# Patient Record
Sex: Male | Born: 1935
Health system: Southern US, Community
[De-identification: ages and names within clinical notes are randomized; demographics above are authoritative.]

## PROBLEM LIST (undated history)

## (undated) DIAGNOSIS — I639 Cerebral infarction, unspecified: Secondary | ICD-10-CM

## (undated) DIAGNOSIS — K56609 Unspecified intestinal obstruction, unspecified as to partial versus complete obstruction: Secondary | ICD-10-CM

## (undated) DIAGNOSIS — N400 Enlarged prostate without lower urinary tract symptoms: Secondary | ICD-10-CM

## (undated) DIAGNOSIS — Z8711 Personal history of peptic ulcer disease: Secondary | ICD-10-CM

## (undated) DIAGNOSIS — D649 Anemia, unspecified: Secondary | ICD-10-CM

## (undated) DIAGNOSIS — Z87442 Personal history of urinary calculi: Secondary | ICD-10-CM

## (undated) DIAGNOSIS — G629 Polyneuropathy, unspecified: Secondary | ICD-10-CM

## (undated) DIAGNOSIS — E119 Type 2 diabetes mellitus without complications: Secondary | ICD-10-CM

## (undated) DIAGNOSIS — A809 Acute poliomyelitis, unspecified: Secondary | ICD-10-CM

## (undated) DIAGNOSIS — I10 Essential (primary) hypertension: Secondary | ICD-10-CM

## (undated) DIAGNOSIS — M1990 Unspecified osteoarthritis, unspecified site: Secondary | ICD-10-CM

## (undated) DIAGNOSIS — K5900 Constipation, unspecified: Secondary | ICD-10-CM

## (undated) DIAGNOSIS — Z8719 Personal history of other diseases of the digestive system: Secondary | ICD-10-CM

## (undated) DIAGNOSIS — R0602 Shortness of breath: Secondary | ICD-10-CM

## (undated) HISTORY — PX: INGUINAL HERNIA REPAIR: SUR1180

## (undated) HISTORY — PX: EXCISIONAL HEMORRHOIDECTOMY: SHX1541

## (undated) HISTORY — PX: EYE SURGERY: SHX253

## (undated) HISTORY — PX: COLON SURGERY: SHX602

## (undated) HISTORY — PX: BOWEL RESECTION: SHX1257

## (undated) HISTORY — DX: Cerebral infarction, unspecified: I63.9

## (undated) HISTORY — PX: COLONOSCOPY: SHX174

## (undated) HISTORY — DX: Acute poliomyelitis, unspecified: A80.9

## (undated) HISTORY — PX: CATARACT EXTRACTION W/ INTRAOCULAR LENS  IMPLANT, BILATERAL: SHX1307

---

## 1969-02-17 HISTORY — PX: INGUINAL HERNIA REPAIR: SUR1180

## 1998-10-27 ENCOUNTER — Ambulatory Visit (HOSPITAL_COMMUNITY): Admission: RE | Admit: 1998-10-27 | Discharge: 1998-10-27 | Payer: Self-pay | Admitting: General Surgery

## 1998-11-15 ENCOUNTER — Emergency Department (HOSPITAL_COMMUNITY): Admission: EM | Admit: 1998-11-15 | Discharge: 1998-11-15 | Payer: Self-pay | Admitting: Emergency Medicine

## 1999-02-18 HISTORY — PX: CATARACT EXTRACTION W/ INTRAOCULAR LENS  IMPLANT, BILATERAL: SHX1307

## 1999-02-18 HISTORY — PX: EXCISIONAL HEMORRHOIDECTOMY: SHX1541

## 1999-09-15 ENCOUNTER — Encounter: Payer: Self-pay | Admitting: Endocrinology

## 1999-09-15 ENCOUNTER — Ambulatory Visit (HOSPITAL_COMMUNITY): Admission: RE | Admit: 1999-09-15 | Discharge: 1999-09-15 | Payer: Self-pay | Admitting: Endocrinology

## 2004-08-27 ENCOUNTER — Emergency Department (HOSPITAL_COMMUNITY): Admission: EM | Admit: 2004-08-27 | Discharge: 2004-08-28 | Payer: Self-pay | Admitting: Emergency Medicine

## 2005-07-13 ENCOUNTER — Emergency Department (HOSPITAL_COMMUNITY): Admission: EM | Admit: 2005-07-13 | Discharge: 2005-07-13 | Payer: Self-pay | Admitting: Emergency Medicine

## 2005-07-18 ENCOUNTER — Encounter: Admission: RE | Admit: 2005-07-18 | Discharge: 2005-07-18 | Payer: Self-pay | Admitting: Endocrinology

## 2005-08-29 ENCOUNTER — Encounter: Admission: RE | Admit: 2005-08-29 | Discharge: 2005-09-27 | Payer: Self-pay | Admitting: Neurosurgery

## 2005-10-11 ENCOUNTER — Ambulatory Visit (HOSPITAL_COMMUNITY): Admission: RE | Admit: 2005-10-11 | Discharge: 2005-10-11 | Payer: Self-pay | Admitting: *Deleted

## 2006-10-24 ENCOUNTER — Emergency Department (HOSPITAL_COMMUNITY): Admission: EM | Admit: 2006-10-24 | Discharge: 2006-10-24 | Payer: Self-pay | Admitting: Emergency Medicine

## 2008-03-23 ENCOUNTER — Emergency Department (HOSPITAL_COMMUNITY): Admission: EM | Admit: 2008-03-23 | Discharge: 2008-03-23 | Payer: Self-pay | Admitting: Emergency Medicine

## 2008-04-10 ENCOUNTER — Encounter: Admission: RE | Admit: 2008-04-10 | Discharge: 2008-04-10 | Payer: Self-pay | Admitting: Endocrinology

## 2008-06-24 ENCOUNTER — Inpatient Hospital Stay (HOSPITAL_COMMUNITY): Admission: EM | Admit: 2008-06-24 | Discharge: 2008-07-05 | Payer: Self-pay | Admitting: Emergency Medicine

## 2008-10-02 ENCOUNTER — Encounter: Admission: RE | Admit: 2008-10-02 | Discharge: 2008-10-02 | Payer: Self-pay | Admitting: Endocrinology

## 2009-04-28 ENCOUNTER — Other Ambulatory Visit: Payer: Self-pay | Admitting: Emergency Medicine

## 2009-04-28 ENCOUNTER — Inpatient Hospital Stay (HOSPITAL_COMMUNITY): Admission: AD | Admit: 2009-04-28 | Discharge: 2009-04-30 | Payer: Self-pay | Admitting: Internal Medicine

## 2009-05-03 ENCOUNTER — Inpatient Hospital Stay (HOSPITAL_COMMUNITY): Admission: EM | Admit: 2009-05-03 | Discharge: 2009-05-05 | Payer: Self-pay | Admitting: Emergency Medicine

## 2009-09-01 ENCOUNTER — Emergency Department (HOSPITAL_COMMUNITY): Admission: EM | Admit: 2009-09-01 | Discharge: 2009-09-02 | Payer: Self-pay | Admitting: Emergency Medicine

## 2009-09-30 ENCOUNTER — Emergency Department (HOSPITAL_COMMUNITY): Admission: EM | Admit: 2009-09-30 | Discharge: 2009-09-30 | Payer: Self-pay | Admitting: Emergency Medicine

## 2010-09-11 LAB — URINALYSIS, ROUTINE W REFLEX MICROSCOPIC
Ketones, ur: NEGATIVE mg/dL
Leukocytes, UA: NEGATIVE
Nitrite: NEGATIVE
Protein, ur: NEGATIVE mg/dL
pH: 6 (ref 5.0–8.0)

## 2010-09-11 LAB — GLUCOSE, CAPILLARY: Glucose-Capillary: 429 mg/dL — ABNORMAL HIGH (ref 70–99)

## 2010-09-21 LAB — COMPREHENSIVE METABOLIC PANEL
ALT: 12 U/L (ref 0–53)
ALT: 15 U/L (ref 0–53)
AST: 19 U/L (ref 0–37)
AST: 31 U/L (ref 0–37)
BUN: 14 mg/dL (ref 6–23)
CO2: 27 mEq/L (ref 19–32)
Calcium: 8.2 mg/dL — ABNORMAL LOW (ref 8.4–10.5)
Calcium: 8 mg/dL — ABNORMAL LOW (ref 8.4–10.5)
Calcium: 8.8 mg/dL (ref 8.4–10.5)
Creatinine, Ser: 1.06 mg/dL (ref 0.4–1.5)
Creatinine, Ser: 1.25 mg/dL (ref 0.4–1.5)
GFR calc Af Amer: >60 mL/min (ref >60)
GFR calc Af Amer: >60 mL/min (ref >60)
GFR calc non Af Amer: >60 mL/min (ref >60)
Glucose, Bld: 163 mg/dL — ABNORMAL HIGH (ref 70–99)
Sodium: 140 mEq/L (ref 135–145)
Sodium: 136 mEq/L (ref 135–145)
Sodium: 140 mEq/L (ref 135–145)
Total Protein: 5 g/dL — ABNORMAL LOW (ref 6.0–8.3)
Total Protein: 4.8 g/dL — ABNORMAL LOW (ref 6.0–8.3)
Total Protein: 5.6 g/dL — ABNORMAL LOW (ref 6.0–8.3)

## 2010-09-21 LAB — CBC
HCT: 28.6 % — ABNORMAL LOW (ref 39.0–52.0)
HCT: 29.9 % — ABNORMAL LOW (ref 39.0–52.0)
HCT: 31.6 % — ABNORMAL LOW (ref 39.0–52.0)
HCT: 33.7 % — ABNORMAL LOW (ref 39.0–52.0)
HCT: 27.6 % — ABNORMAL LOW (ref 39.0–52.0)
Hemoglobin: 6.7 g/dL — CL (ref 13.0–17.0)
Hemoglobin: 10 g/dL — ABNORMAL LOW (ref 13.0–17.0)
Hemoglobin: 10.1 g/dL — ABNORMAL LOW (ref 13.0–17.0)
Hemoglobin: 10.2 g/dL — ABNORMAL LOW (ref 13.0–17.0)
MCHC: 33 g/dL (ref 30.0–36.0)
MCHC: 33.4 g/dL (ref 30.0–36.0)
MCHC: 34.1 g/dL (ref 30.0–36.0)
MCHC: 34.1 g/dL (ref 30.0–36.0)
MCHC: 34.2 g/dL (ref 30.0–36.0)
MCHC: 35.1 g/dL (ref 30.0–36.0)
MCHC: 34.4 g/dL (ref 30.0–36.0)
MCV: 93.5 fL (ref 78.0–100.0)
MCV: 94.1 fL (ref 78.0–100.0)
MCV: 93.4 fL (ref 78.0–100.0)
MCV: 93.5 fL (ref 78.0–100.0)
MCV: 93.7 fL (ref 78.0–100.0)
MCV: 94 fL (ref 78.0–100.0)
MCV: 94.7 fL (ref 78.0–100.0)
MCV: 95.5 fL (ref 78.0–100.0)
Platelets: 173 10*3/uL (ref 150–400)
Platelets: 141 10*3/uL — ABNORMAL LOW (ref 150–400)
Platelets: 147 10*3/uL — ABNORMAL LOW (ref 150–400)
Platelets: 157 10*3/uL (ref 150–400)
Platelets: 159 10*3/uL (ref 150–400)
Platelets: 168 10*3/uL (ref 150–400)
Platelets: 187 10*3/uL (ref 150–400)
RBC: 3.15 MIL/uL — ABNORMAL LOW (ref 4.22–5.81)
RBC: 3.19 MIL/uL — ABNORMAL LOW (ref 4.22–5.81)
RBC: 3.19 MIL/uL — ABNORMAL LOW (ref 4.22–5.81)
RDW: 14.5 % (ref 11.5–15.5)
RDW: 13.3 % (ref 11.5–15.5)
RDW: 13.3 % (ref 11.5–15.5)
RDW: 13.4 % (ref 11.5–15.5)
RDW: 13.5 % (ref 11.5–15.5)
RDW: 13.7 % (ref 11.5–15.5)
RDW: 14.3 % (ref 11.5–15.5)
RDW: 14.3 % (ref 11.5–15.5)
RDW: 14.5 % (ref 11.5–15.5)
WBC: 8.1 10*3/uL (ref 4.0–10.5)
WBC: 9.2 10*3/uL (ref 4.0–10.5)
WBC: 10.8 10*3/uL — ABNORMAL HIGH (ref 4.0–10.5)

## 2010-09-21 LAB — BASIC METABOLIC PANEL
BUN: 10 mg/dL (ref 6–23)
Calcium: 7.6 mg/dL — ABNORMAL LOW (ref 8.4–10.5)
Calcium: 8.8 mg/dL (ref 8.4–10.5)
GFR calc Af Amer: >60 mL/min (ref >60)
GFR calc Af Amer: >60 mL/min (ref >60)
GFR calc non Af Amer: >60 mL/min (ref >60)
GFR calc non Af Amer: >60 mL/min (ref >60)
Glucose, Bld: 128 mg/dL — ABNORMAL HIGH (ref 70–99)
Potassium: 4 mEq/L (ref 3.5–5.1)
Sodium: 136 mEq/L (ref 135–145)
Sodium: 137 mEq/L (ref 135–145)

## 2010-09-21 LAB — CROSSMATCH
Antibody Screen: NEGATIVE
Antibody Screen: NEGATIVE

## 2010-09-21 LAB — GLUCOSE, CAPILLARY
Glucose-Capillary: 132 mg/dL — ABNORMAL HIGH (ref 70–99)
Glucose-Capillary: 210 mg/dL — ABNORMAL HIGH (ref 70–99)
Glucose-Capillary: 243 mg/dL — ABNORMAL HIGH (ref 70–99)
Glucose-Capillary: 70 mg/dL (ref 70–99)
Glucose-Capillary: 88 mg/dL (ref 70–99)
Glucose-Capillary: 108 mg/dL — ABNORMAL HIGH (ref 70–99)
Glucose-Capillary: 114 mg/dL — ABNORMAL HIGH (ref 70–99)
Glucose-Capillary: 119 mg/dL — ABNORMAL HIGH (ref 70–99)
Glucose-Capillary: 141 mg/dL — ABNORMAL HIGH (ref 70–99)
Glucose-Capillary: 147 mg/dL — ABNORMAL HIGH (ref 70–99)
Glucose-Capillary: 231 mg/dL — ABNORMAL HIGH (ref 70–99)
Glucose-Capillary: 94 mg/dL (ref 70–99)
Glucose-Capillary: 97 mg/dL (ref 70–99)

## 2010-09-21 LAB — PROTIME-INR
INR: 1.3 (ref 0.00–1.49)
Prothrombin Time: 15.5 seconds — ABNORMAL HIGH (ref 11.6–15.2)
Prothrombin Time: 16.1 seconds — ABNORMAL HIGH (ref 11.6–15.2)

## 2010-09-21 LAB — STOOL CULTURE

## 2010-09-21 LAB — DIFFERENTIAL
Basophils Absolute: 0 10*3/uL (ref 0.0–0.1)
Basophils Relative: 1 % (ref 0–1)
Basophils Relative: 0 % (ref 0–1)
Eosinophils Absolute: 0 10*3/uL (ref 0.0–0.7)
Eosinophils Relative: 0 % (ref 0–5)
Eosinophils Relative: 0 % (ref 0–5)
Lymphs Abs: 0.9 10*3/uL (ref 0.7–4.0)
Lymphs Abs: 1.3 10*3/uL (ref 0.7–4.0)
Monocytes Absolute: 0.4 10*3/uL (ref 0.1–1.0)
Monocytes Relative: 3 % (ref 3–12)
Neutrophils Relative %: 90 % — ABNORMAL HIGH (ref 43–77)
Neutrophils Relative %: 84 % — ABNORMAL HIGH (ref 43–77)

## 2010-09-21 LAB — HEMOGLOBIN AND HEMATOCRIT, BLOOD
Hemoglobin: 9.2 g/dL — ABNORMAL LOW (ref 13.0–17.0)
Hemoglobin: 9 g/dL — ABNORMAL LOW (ref 13.0–17.0)

## 2010-09-21 LAB — LIPID PANEL
HDL: 36 mg/dL — ABNORMAL LOW (ref >39)
LDL Cholesterol: 25 mg/dL (ref 0–99)
Total CHOL/HDL Ratio: 2 RATIO
Triglycerides: 48 mg/dL (ref <150)
VLDL: 10 mg/dL (ref 0–40)

## 2010-09-21 LAB — POCT I-STAT, CHEM 8
BUN: 16 mg/dL (ref 6–23)
Chloride: 103 mEq/L (ref 96–112)
Glucose, Bld: 279 mg/dL — ABNORMAL HIGH (ref 70–99)
HCT: 27 % — ABNORMAL LOW (ref 39.0–52.0)
Potassium: 4.9 mEq/L (ref 3.5–5.1)

## 2010-09-21 LAB — ABO/RH: ABO/RH(D): A POS

## 2010-09-21 LAB — OVA AND PARASITE EXAMINATION: Ova and parasites: NONE SEEN

## 2010-09-21 LAB — CLOSTRIDIUM DIFFICILE EIA: C difficile Toxins A+B, EIA: NEGATIVE

## 2010-09-21 LAB — APTT: aPTT: 24 seconds (ref 24–37)

## 2010-10-03 LAB — CBC
HCT: 34.5 % — ABNORMAL LOW (ref 39.0–52.0)
HCT: 37.9 % — ABNORMAL LOW (ref 39.0–52.0)
HCT: 40.9 % (ref 39.0–52.0)
HCT: 49.2 % (ref 39.0–52.0)
Hemoglobin: 11.5 g/dL — ABNORMAL LOW (ref 13.0–17.0)
Hemoglobin: 12.5 g/dL — ABNORMAL LOW (ref 13.0–17.0)
Hemoglobin: 13.3 g/dL (ref 13.0–17.0)
Hemoglobin: 13.8 g/dL (ref 13.0–17.0)
MCHC: 33 g/dL (ref 30.0–36.0)
MCHC: 33.1 g/dL (ref 30.0–36.0)
MCHC: 33.6 g/dL (ref 30.0–36.0)
MCHC: 33.9 g/dL (ref 30.0–36.0)
MCV: 94 fL (ref 78.0–100.0)
MCV: 94 fL (ref 78.0–100.0)
MCV: 94.4 fL (ref 78.0–100.0)
MCV: 94.5 fL (ref 78.0–100.0)
MCV: 95.1 fL (ref 78.0–100.0)
MCV: 95.1 fL (ref 78.0–100.0)
Platelets: 240 10*3/uL (ref 150–400)
Platelets: 249 10*3/uL (ref 150–400)
RBC: 3.67 MIL/uL — ABNORMAL LOW (ref 4.22–5.81)
RBC: 3.99 MIL/uL — ABNORMAL LOW (ref 4.22–5.81)
RBC: 4.19 MIL/uL — ABNORMAL LOW (ref 4.22–5.81)
RBC: 4.41 MIL/uL (ref 4.22–5.81)
RBC: 5.17 MIL/uL (ref 4.22–5.81)
WBC: 19.8 10*3/uL — ABNORMAL HIGH (ref 4.0–10.5)
WBC: 6.4 10*3/uL (ref 4.0–10.5)

## 2010-10-03 LAB — GLUCOSE, CAPILLARY
Glucose-Capillary: 106 mg/dL — ABNORMAL HIGH (ref 70–99)
Glucose-Capillary: 109 mg/dL — ABNORMAL HIGH (ref 70–99)
Glucose-Capillary: 116 mg/dL — ABNORMAL HIGH (ref 70–99)
Glucose-Capillary: 117 mg/dL — ABNORMAL HIGH (ref 70–99)
Glucose-Capillary: 117 mg/dL — ABNORMAL HIGH (ref 70–99)
Glucose-Capillary: 119 mg/dL — ABNORMAL HIGH (ref 70–99)
Glucose-Capillary: 119 mg/dL — ABNORMAL HIGH (ref 70–99)
Glucose-Capillary: 122 mg/dL — ABNORMAL HIGH (ref 70–99)
Glucose-Capillary: 124 mg/dL — ABNORMAL HIGH (ref 70–99)
Glucose-Capillary: 135 mg/dL — ABNORMAL HIGH (ref 70–99)
Glucose-Capillary: 140 mg/dL — ABNORMAL HIGH (ref 70–99)
Glucose-Capillary: 143 mg/dL — ABNORMAL HIGH (ref 70–99)
Glucose-Capillary: 148 mg/dL — ABNORMAL HIGH (ref 70–99)
Glucose-Capillary: 149 mg/dL — ABNORMAL HIGH (ref 70–99)
Glucose-Capillary: 152 mg/dL — ABNORMAL HIGH (ref 70–99)
Glucose-Capillary: 161 mg/dL — ABNORMAL HIGH (ref 70–99)
Glucose-Capillary: 161 mg/dL — ABNORMAL HIGH (ref 70–99)
Glucose-Capillary: 170 mg/dL — ABNORMAL HIGH (ref 70–99)
Glucose-Capillary: 178 mg/dL — ABNORMAL HIGH (ref 70–99)
Glucose-Capillary: 179 mg/dL — ABNORMAL HIGH (ref 70–99)
Glucose-Capillary: 195 mg/dL — ABNORMAL HIGH (ref 70–99)
Glucose-Capillary: 202 mg/dL — ABNORMAL HIGH (ref 70–99)
Glucose-Capillary: 212 mg/dL — ABNORMAL HIGH (ref 70–99)
Glucose-Capillary: 212 mg/dL — ABNORMAL HIGH (ref 70–99)
Glucose-Capillary: 243 mg/dL — ABNORMAL HIGH (ref 70–99)
Glucose-Capillary: 88 mg/dL (ref 70–99)
Glucose-Capillary: 89 mg/dL (ref 70–99)
Glucose-Capillary: 91 mg/dL (ref 70–99)
Glucose-Capillary: 95 mg/dL (ref 70–99)
Glucose-Capillary: 99 mg/dL (ref 70–99)
Glucose-Capillary: 152 mg/dL — ABNORMAL HIGH (ref 70–99)
Glucose-Capillary: 189 mg/dL — ABNORMAL HIGH (ref 70–99)
Glucose-Capillary: 86 mg/dL (ref 70–99)
Glucose-Capillary: 92 mg/dL (ref 70–99)

## 2010-10-03 LAB — BASIC METABOLIC PANEL
BUN: 16 mg/dL (ref 6–23)
BUN: 7 mg/dL (ref 6–23)
CO2: 28 mEq/L (ref 19–32)
CO2: 31 mEq/L (ref 19–32)
CO2: 32 mEq/L (ref 19–32)
CO2: 34 mEq/L — ABNORMAL HIGH (ref 19–32)
Calcium: 9 mg/dL (ref 8.4–10.5)
Chloride: 101 mEq/L (ref 96–112)
Chloride: 105 mEq/L (ref 96–112)
Chloride: 98 mEq/L (ref 96–112)
Chloride: 99 mEq/L (ref 96–112)
Creatinine, Ser: 0.96 mg/dL (ref 0.4–1.5)
Creatinine, Ser: 1.11 mg/dL (ref 0.4–1.5)
Creatinine, Ser: 1.2 mg/dL (ref 0.4–1.5)
GFR calc Af Amer: >60 mL/min (ref >60)
GFR calc Af Amer: >60 mL/min (ref >60)
GFR calc Af Amer: >60 mL/min (ref >60)
GFR calc non Af Amer: >60 mL/min (ref >60)
Glucose, Bld: 240 mg/dL — ABNORMAL HIGH (ref 70–99)
Potassium: 3.3 mEq/L — ABNORMAL LOW (ref 3.5–5.1)
Potassium: 4 mEq/L (ref 3.5–5.1)
Potassium: 4 mEq/L (ref 3.5–5.1)
Sodium: 136 mEq/L (ref 135–145)
Sodium: 139 mEq/L (ref 135–145)
Sodium: 143 mEq/L (ref 135–145)

## 2010-10-03 LAB — DIFFERENTIAL
Basophils Absolute: 0.1 10*3/uL (ref 0.0–0.1)
Eosinophils Relative: 0 % (ref 0–5)
Lymphocytes Relative: 7 % — ABNORMAL LOW (ref 12–46)
Neutro Abs: 17.7 10*3/uL — ABNORMAL HIGH (ref 1.7–7.7)
Neutrophils Relative %: 89 % — ABNORMAL HIGH (ref 43–77)

## 2010-10-03 LAB — COMPREHENSIVE METABOLIC PANEL
ALT: 17 U/L (ref 0–53)
AST: 47 U/L — ABNORMAL HIGH (ref 0–37)
BUN: 11 mg/dL (ref 6–23)
BUN: 13 mg/dL (ref 6–23)
CO2: 31 mEq/L (ref 19–32)
CO2: 32 mEq/L (ref 19–32)
Calcium: 9.1 mg/dL (ref 8.4–10.5)
Chloride: 93 mEq/L — ABNORMAL LOW (ref 96–112)
Creatinine, Ser: 0.97 mg/dL (ref 0.4–1.5)
Creatinine, Ser: 1.29 mg/dL (ref 0.4–1.5)
GFR calc non Af Amer: 55 mL/min — ABNORMAL LOW (ref >60)
GFR calc non Af Amer: >60 mL/min (ref >60)
Glucose, Bld: 188 mg/dL — ABNORMAL HIGH (ref 70–99)
Glucose, Bld: 289 mg/dL — ABNORMAL HIGH (ref 70–99)
Total Bilirubin: 0.8 mg/dL (ref 0.3–1.2)

## 2010-10-03 LAB — URINALYSIS, ROUTINE W REFLEX MICROSCOPIC
Ketones, ur: 15 mg/dL — AB
Leukocytes, UA: NEGATIVE
Nitrite: NEGATIVE
Protein, ur: 100 mg/dL — AB
pH: 7 (ref 5.0–8.0)

## 2010-10-03 LAB — LIPASE, BLOOD
Lipase: 74 U/L — ABNORMAL HIGH (ref 11–59)
Lipase: <10 U/L — ABNORMAL LOW (ref 11–59)

## 2010-10-03 LAB — PROTIME-INR
INR: 1.2 (ref 0.00–1.49)
Prothrombin Time: 15.1 seconds (ref 11.6–15.2)

## 2010-10-03 LAB — URINE MICROSCOPIC-ADD ON

## 2010-11-01 NOTE — Consult Note (Signed)
NAMEKYE, HEDDEN NO.:  [id number]   MEDICAL RECORD NO.:  [id number]          PATIENT TYPE:  INP   LOCATION:  0165                         FACILITY:  [facility]   PHYSICIAN:  Lonia Blood, M.D.      DATE OF BIRTH:  03-28-1936   DATE OF CONSULTATION:  06/24/2008  DATE OF DISCHARGE:                                 CONSULTATION   PRIMARY CARE PHYSICIAN:  Dr. Darci Needle.   REASON FOR CONSULTATION:  Diabetic management.   HISTORY OF PRESENT ILLNESS:  The patient is a 75 year old gentleman with  history of diabetes, hypertension and polio who was admitted today with  presumed ileus versus small-bowel obstruction.  The patient apparently  had abdominal pain, nausea, vomiting that started today.  He was seen in  the emergency room.  He denied any diarrhea, but he did have one bowel  movement prior to coming in.  He was evaluated and had a CT scan of the  abdomen that showed possible adhesion in the midline of the pelvis  called a small bowel obstruction.  There was a small amount of pelvic  ascites and atrophy of the gluteus minimus.  He was admitted by General  Surgery for conservative measures, and we are consulted for his diabetic  management.  He denied any fever.  Denied any urinary symptoms.   PAST MEDICAL HISTORY:  1. Diabetes.  2. Recurrent headaches.  3. Hypertension.  4. Polio.   ALLERGIES:  NO KNOWN DRUG ALLERGIES.   MEDICATIONS:  1. Glucophage 1000 mg b.i.d.  2. Altace 10 mg.  3. Neurontin 400 mg b.i.d.  4. Flexeril 10 mg daily.  5. Aspirin 325 mg daily.  6. Zocor 20 mg .  7. Januvia 100 mg daily.   SOCIAL HISTORY:  The patient lives in [place].  He denied tobacco,  alcohol or IV drug use.   FAMILY HISTORY:  Noncontributory.   REVIEW OF SYSTEMS:  Mainly weakness, otherwise 12-point review of  systems per HPI.   PHYSICAL EXAMINATION:  VITAL SIGNS:  Temperature is 98, blood pressure  146/89, pulse 109, respiratory rate 24.  Satting 96% on  room air.  GENERAL:  He is awake, alert, oriented, looks acutely ill.  No acute  distress.  HEENT:  PERRL.  EOMI.  NECK:  Supple.  No JVD, no lymphadenopathy.  RESPIRATORY:  He has been good air entry bilaterally.  No wheezes, no  rales.  CARDIOVASCULAR:  He is tachycardia.  ABDOMEN:  Distended, soft with minimal bowel sounds.  Tympanitic.  EXTREMITIES:  No edema, cyanosis or clubbing.   LABORATORY DATA:  Glucose 202, white count 19.8, hemoglobin 16, platelet  count 266 with normal differentials with left shift, ANC of 17.7.  Sodium 138, potassium 4.4, chloride 93, CO2 31, glucose 289, creatinine  1.29, calcium 10.6, total protein 7.90 and albumin 4.4.  Minimal LFT  elevation.  Lipase is 74.  Urinalysis showed cloudy urine with  glucosuria, small bilirubin, mild ketones, mild proteinuria.  WBCs 0-2  and few bacteria.  Acute abdominal series showed findings consistent  with air fluid levels and borderline dilated  status small bowel loops  with prominence of gas and stool in the colon.  Appearance is abnormal  but nonspecific and could be seen in the setting of small bowel ileus or  early small-bowel obstruction.  CT abdomen and pelvis showed adhesions  in the midline of the pelvis with small bowel obstructions, small amount  of pelvic ascites, atrophy of the gluteus minimus medius.   ASSESSMENT:  Therefore, this is a 75 year old gentleman admitted with  small bowel obstruction versus ileus who is also a diabetic and  hypertensive.   RECOMMENDATIONS:  1. Patient agrees should be n.p.o.  While n.p.o.,  the patient will be      on sliding scale insulin and will hold his metformin and Januvia.      2.  Hypertension.  The patient's blood pressure seems reasonable at      this point.  If he starts rising, will use IV metoprolol p.r.n. to      control his blood pressure.  That is until he starts taking orally.      We will hold other medications including Neurontin, Altace,      Flexeril,  aspirin and Zocor until the patient's p.o. intake      resumes.  We will check hemoglobin A1c.  Will continue to follow      the patient with you.   Thank you for the consult.      Lonia Blood, M.D.  Electronically Signed     LG/MEDQ  D:  06/25/2008  T:  06/25/2008  Job:  [id number]

## 2010-11-01 NOTE — Op Note (Signed)
Mike Mack, Mike Mack NO.:  [id number]   MEDICAL RECORD NO.:  [id number]          PATIENT TYPE:  INP   LOCATION:  1530                         FACILITY:  [facility]   PHYSICIAN:  Velora Heckler, MD      DATE OF BIRTH:  [birth year]   DATE OF PROCEDURE:  06/28/2008  DATE OF DISCHARGE:                               OPERATIVE REPORT   PREOPERATIVE DIAGNOSIS:  Small bowel obstruction.   POSTOPERATIVE DIAGNOSIS:  Small bowel obstruction.   PROCEDURE:  Exploratory laparotomy with lysis of adhesions.   SURGEON:  Velora Heckler, MD, FACS   ASSISTANT:  Iona Coach, MD, FACS   ANESTHESIA:  General per Jill Side, MD   ESTIMATED BLOOD LOSS:  Minimal.   PREPARATION:  Betadine.   COMPLICATIONS:  None.   INDICATIONS:  The patient is a 75 year old black male admitted on  January 6 by Dr. Baruch Merl with signs and symptoms of small bowel  obstruction.  The patient failed to resolve on conservative measures.  Abdominal x-rays show worsening signs of small bowel obstruction.  These  were reviewed today with Dr. Rosalie Gums.  Decision is made to proceed to  laparotomy.   PROCEDURE IN DETAIL:  The procedure was done in OR #1 at the [facility].  The patient is brought to the operating room,  placed in a supine position on the operating room table.  Following  administration of general anesthesia the patient is prepped and draped  in the usual strict aseptic fashion.  After ascertaining that an  adequate level of anesthesia had been obtained a midline abdominal  incision was made with a #10 blade.  Dissection was carried down through  subcutaneous tissues.  Fascia was incised in the midline and the  peritoneal cavity was entered cautiously.  Approximately 500 mL of  ascitic fluid is evacuated from the abdomen.  Multiple dilated loops of  small bowel are delivered out of the peritoneal cavity.  Apparently a  small band like adhesion in the mid  ileum was responsible for the  obstruction.  This was easily lysed with blunt digital dissection.  Small bowel is then run from the ligament of Treitz to the ileocecal  valve without other evidence of abnormality.  Nasogastric tube is placed  and properly positioned within the stomach.  Bowel contents are moved  retrograde into the stomach and evacuated with a nasogastric tube.  Nasogastric tube was taped in position for postoperative use.  Bowels  are returned to the abdominal cavity in approximately their normal  anatomic location.  They are covered with the omentum.  Abdomen is  inspected and no other gross abnormality is identified.  Midline wound  is closed  with interrupted #1 Novofil sutures.  Subcutaneous tissues are  irrigated.  Skin was closed with stainless steel staples.  Sterile  dressings are applied.  The patient is awakened from anesthesia and  brought to the recovery room in stable condition.  The patient tolerated  the procedure well.      Velora Heckler, MD  Electronically Signed  TMG/MEDQ  D:  06/28/2008  T:  06/28/2008  Job:  [id number]

## 2010-11-01 NOTE — H&P (Signed)
NAME:  Mike Mack, Mike Mack NO.:  [id number]   MEDICAL RECORD NO.:  [id number]          PATIENT TYPE:  EMS   LOCATION:  ED                           FACILITY:  [facility]   PHYSICIAN:  Alfonse Ras, MD   DATE OF BIRTH:  Oct 23, 1935   DATE OF ADMISSION:  06/24/2008  DATE OF DISCHARGE:                              HISTORY & PHYSICAL   ADMISSION DIAGNOSIS:  Abdominal pain, ileus versus small bowel  obstruction.   HISTORY OF PRESENT ILLNESS:  The patient is a 75 year old male who  presented with complaints of stomach discomfort, nausea, and vomiting.  He had 2 normal bowel movements today and is continued to pass  significant amount of gas.  He is running a low grade temperature at  home.  He was worked up here in the emergency room and found to have a  white count of 19,000.  CT scan shows some dilated small bowel loops and  questionable transition point in the lower abdomen, however, the patient  is known to have previous abdominal surgery.  The patient is feeling  better when I examined him in the emergency room, but is still having  some nausea.   PAST MEDICAL HISTORY:  Significant for hypertension, left inguinal  hernia repair, and non-insulin-dependent diabetes mellitus.   MEDICATIONS:  Medications include:  1. Glucophage 1000 mg twice a day.  2. Altace of unknown dose.  3. Metformin of an unknown dose.  4. Neurontin 400 mg twice a day.  5. Flexeril 10 mg once a day.  6. Aspirin 325 mg a day.  7. Zocor 20 mg a day.  8. Januvia 100 mg once a day.   REVIEW OF SYSTEMS:  Significant as above for nausea.   PHYSICAL EXAMINATION:  GENERAL:  On physical examination, he is an age-  appropriate black male in no distress.  He is lying in the bed in room  20 in the emergency room at [facility].  VITAL SIGNS:  Heart rate of 109, respiratory rate of 16, temperature of  98, and blood pressure 146/89.  HEENT:  Benign.  He is tender in the posterior aspect of his  left  mandible where he had a previous tooth extraction last week.  LUNGS:  Clear to auscultation and percussion x2.  HEART:  Regular rate and rhythm with a rate of 110, but there are no  murmurs, rubs, or gallops.  ABDOMEN:  Slightly distended and tympanitic, but there are no masses and  no abdominal wall hernia defects.  EXTREMITIES:  No clubbing, cyanosis, or edema.  Normal pulses, 2+ pulses  throughout.  NEUROLOGIC:  Grossly intact.   IMPRESSION:  Questionable ileus with gastroenteritis versus small bowel  obstruction with questionable etiology with no prior abdominal surgery.   PLAN:  Admission, NG tube placement, and IV hydration.  Follow up films  in the morning, and we will follow him along.  Hopefully, he will not  need surgical intervention as he has had a colonoscopy in the last few  years per the patient's report.  He has significant amount of stool and  gas in his colon, so this is either a very early small bowel obstruction  or an ileus.  In  campus, he will be following along to help manage his  diabetes and hypertension.      Alfonse Ras, MD  Electronically Signed     KRE/MEDQ  D:  06/24/2008  T:  06/24/2008  Job:  [id number]

## 2010-11-04 NOTE — Discharge Summary (Signed)
Mike Mack, Mike Mack NO.:  [id number]   MEDICAL RECORD NO.:  [id number]          PATIENT TYPE:  INP   LOCATION:  1530                         FACILITY:  [facility]   PHYSICIAN:  Velora Heckler, MD      DATE OF BIRTH:  [birth year]   DATE OF ADMISSION:  06/24/2008  DATE OF DISCHARGE:  07/05/2008                               DISCHARGE SUMMARY   REASON FOR ADMISSION:  Abdominal pain, small bowel obstruction.   HISTORY OF PRESENT ILLNESS:  Mike Mack is a 75 year old male  admitted on the surgical service by Dr. Baruch Merl on June 24, 2008  with abdominal pain, nausea and vomiting.  The patient was evaluated in  the emergency department and found to have an elevated white blood cell  count of 19,000.  CT scan abdomen and pelvis was obtained which showed  dilated small bowel loops and a transition point in the lower abdomen.  The patient was admitted to the surgical service.  The patient was seen  in consultation by Dr. Mikeal Hawthorne from the medical service.  The patient was  prepared and taken to the operating room on June 28, 2008.  He  underwent exploratory laparotomy with lysis of adhesions for small bowel  obstruction.  Postoperative course was uncomplicated.  He had a  nasogastric tube in place for postoperative ileus.  Nasogastric tube was  removed on the 3rd postoperative day, as was his Foley catheter.  The  patient had gradual resolution of his ileus.  He started on a clear  liquid diet on the 4th postoperative day.  The patient made gradual  progress and diet was advanced.  The patient tolerated his diet and  became ambulatory.  He was prepared for discharge home on January 17.   DISCHARGE PLANNING:  The patient was discharged home July 05, 2008 in  good condition, tolerating a regular diet, and ambulating independently.  The patient will be seen back in my office at [facility]  in 2 weeks.   DISCHARGE MEDICATIONS:  Include Vicodin as  needed for pain.   FINAL DIAGNOSIS:  Small bowel obstruction secondary to adhesions.   CONDITION AT DISCHARGE:  Improved.      Velora Heckler, MD  Electronically Signed     TMG/MEDQ  D:  07/28/2008  T:  07/28/2008  Job:  [id number]

## 2010-11-04 NOTE — Op Note (Signed)
NAME:  Mike Mack, RETTER NO.:  [id number]   MEDICAL RECORD NO.:  [id number]          PATIENT TYPE:  AMB   LOCATION:  ENDO                         FACILITY:  [facility]   PHYSICIAN:  Georgiana Spinner, M.D.    DATE OF BIRTH:  Mar 29, 1936   DATE OF PROCEDURE:  10/11/2005  DATE OF DISCHARGE:                                 OPERATIVE REPORT   PROCEDURE:  Colonoscopy.   INDICATIONS:  Colon cancer screening.   ANESTHESIA:  Fentanyl 50 mcg, Versed 5 mg.   PROCEDURE:  With the patient mildly sedated in the left lateral decubitus  position, a rectal exam was performed which was unremarkable.  Subsequently,  the Olympus videoscopic colonoscope was inserted in the rectum and passed  under direct vision to the cecum identified by ileocecal valve and  appendiceal orifice, both which were photographed.  After clearing the cecum  of fecal debris, the colonoscope was slowly withdrawn taking circumferential  views of the colonic mucosa, washing and suctioning as we went in various  places until we reached the rectum which appeared normal on direct and  retroflexed view. The endoscope was straightened and withdrawn.  The  patient's vital signs and pulse oximeter remained stable.  The patient  tolerated the procedure well without apparent complications.   FINDINGS:  Unremarkable examination limited slightly by the prep in that  there were areas of some of solid stool that had to be washed and suctioned,  but no gross lesions were seen.   PLAN:  Consider repeat examination possibly in five years.           ______________________________  Georgiana Spinner, M.D.     GMO/MEDQ  D:  10/11/2005  T:  10/11/2005  Job:  [id number]

## 2011-02-02 DIAGNOSIS — L299 Pruritus, unspecified: Secondary | ICD-10-CM | POA: Insufficient documentation

## 2011-03-20 LAB — COMPREHENSIVE METABOLIC PANEL
ALT: 18
Alkaline Phosphatase: 78
BUN: 18
CO2: 27
Chloride: 103
GFR calc Af Amer: >60
Glucose, Bld: 137 — ABNORMAL HIGH
Potassium: 4.1
Sodium: 139
Total Bilirubin: 1

## 2011-03-20 LAB — URINALYSIS, ROUTINE W REFLEX MICROSCOPIC
Hgb urine dipstick: NEGATIVE
Ketones, ur: 15 — AB
Leukocytes, UA: NEGATIVE
Protein, ur: 30 — AB
Urobilinogen, UA: 1

## 2011-03-20 LAB — DIFFERENTIAL
Basophils Absolute: 0.1
Basophils Relative: 1
Eosinophils Absolute: 0
Neutro Abs: 6.2
Neutrophils Relative %: 86 — ABNORMAL HIGH

## 2011-03-20 LAB — CBC
HCT: 42.5
Hemoglobin: 13.9
Platelets: 255
RBC: 4.48
WBC: 7.2

## 2011-03-20 LAB — URINE MICROSCOPIC-ADD ON

## 2012-04-10 ENCOUNTER — Observation Stay (HOSPITAL_COMMUNITY): Payer: Medicare Other

## 2012-04-10 ENCOUNTER — Emergency Department (HOSPITAL_COMMUNITY): Payer: Medicare Other

## 2012-04-10 ENCOUNTER — Encounter (HOSPITAL_COMMUNITY): Payer: Self-pay | Admitting: Emergency Medicine

## 2012-04-10 ENCOUNTER — Inpatient Hospital Stay (HOSPITAL_COMMUNITY)
Admission: EM | Admit: 2012-04-10 | Discharge: 2012-04-12 | DRG: 065 | Disposition: A | Discharge status: Rehabilitation Facility | Payer: Medicare Other | Attending: Internal Medicine | Admitting: Internal Medicine

## 2012-04-10 DIAGNOSIS — Z8612 Personal history of poliomyelitis: Secondary | ICD-10-CM

## 2012-04-10 DIAGNOSIS — G589 Mononeuropathy, unspecified: Secondary | ICD-10-CM | POA: Diagnosis present

## 2012-04-10 DIAGNOSIS — E119 Type 2 diabetes mellitus without complications: Secondary | ICD-10-CM | POA: Diagnosis present

## 2012-04-10 DIAGNOSIS — Z79899 Other long term (current) drug therapy: Secondary | ICD-10-CM

## 2012-04-10 DIAGNOSIS — R9089 Other abnormal findings on diagnostic imaging of central nervous system: Secondary | ICD-10-CM

## 2012-04-10 DIAGNOSIS — I639 Cerebral infarction, unspecified: Secondary | ICD-10-CM | POA: Diagnosis present

## 2012-04-10 DIAGNOSIS — R930 Abnormal findings on diagnostic imaging of skull and head, not elsewhere classified: Secondary | ICD-10-CM

## 2012-04-10 DIAGNOSIS — I10 Essential (primary) hypertension: Secondary | ICD-10-CM | POA: Diagnosis present

## 2012-04-10 DIAGNOSIS — G629 Polyneuropathy, unspecified: Secondary | ICD-10-CM | POA: Diagnosis present

## 2012-04-10 DIAGNOSIS — R5381 Other malaise: Secondary | ICD-10-CM | POA: Diagnosis present

## 2012-04-10 DIAGNOSIS — I6350 Cerebral infarction due to unspecified occlusion or stenosis of unspecified cerebral artery: Principal | ICD-10-CM | POA: Diagnosis present

## 2012-04-10 DIAGNOSIS — N400 Enlarged prostate without lower urinary tract symptoms: Secondary | ICD-10-CM | POA: Diagnosis present

## 2012-04-10 DIAGNOSIS — R531 Weakness: Secondary | ICD-10-CM

## 2012-04-10 DIAGNOSIS — G910 Communicating hydrocephalus: Secondary | ICD-10-CM | POA: Diagnosis present

## 2012-04-10 DIAGNOSIS — Z8249 Family history of ischemic heart disease and other diseases of the circulatory system: Secondary | ICD-10-CM

## 2012-04-10 DIAGNOSIS — Z9049 Acquired absence of other specified parts of digestive tract: Secondary | ICD-10-CM

## 2012-04-10 HISTORY — DX: Essential (primary) hypertension: I10

## 2012-04-10 HISTORY — DX: Polyneuropathy, unspecified: G62.9

## 2012-04-10 HISTORY — DX: Benign prostatic hyperplasia without lower urinary tract symptoms: N40.0

## 2012-04-10 HISTORY — DX: Unspecified intestinal obstruction, unspecified as to partial versus complete obstruction: K56.609

## 2012-04-10 LAB — CBC
Hemoglobin: 12 g/dL — ABNORMAL LOW (ref 13.0–17.0)
MCHC: 33.1 g/dL (ref 30.0–36.0)
RDW: 13.9 % (ref 11.5–15.5)

## 2012-04-10 LAB — BASIC METABOLIC PANEL
GFR calc Af Amer: >90 mL/min (ref >90)
GFR calc non Af Amer: 79 mL/min — ABNORMAL LOW (ref >90)
Glucose, Bld: 202 mg/dL — ABNORMAL HIGH (ref 70–99)
Potassium: 4.5 mEq/L (ref 3.5–5.1)
Sodium: 134 mEq/L — ABNORMAL LOW (ref 135–145)

## 2012-04-10 LAB — GLUCOSE, CAPILLARY: Glucose-Capillary: 306 mg/dL — ABNORMAL HIGH (ref 70–99)

## 2012-04-10 LAB — HEPATIC FUNCTION PANEL
AST: 21 U/L (ref 0–37)
Bilirubin, Direct: 0.1 mg/dL (ref 0.0–0.3)
Indirect Bilirubin: 0.2 mg/dL — ABNORMAL LOW (ref 0.3–0.9)
Total Bilirubin: 0.3 mg/dL (ref 0.3–1.2)

## 2012-04-10 MED ORDER — GADOBENATE DIMEGLUMINE 529 MG/ML IV SOLN
15.0000 mL | Freq: Once | INTRAVENOUS | Status: AC | PRN
  Administered 2012-04-10: 15 mL via INTRAVENOUS

## 2012-04-10 MED ORDER — STROKE: EARLY STAGES OF RECOVERY BOOK
Freq: Once | Status: AC
  Administered 2012-04-10: 18:00:00
  Filled 2012-04-10: qty 1

## 2012-04-10 MED ORDER — ENOXAPARIN SODIUM 40 MG/0.4ML ~~LOC~~ SOLN
40.0000 mg | SUBCUTANEOUS | Status: DC
  Administered 2012-04-10 – 2012-04-11 (×2): 40 mg via SUBCUTANEOUS
  Filled 2012-04-10 (×3): qty 0.4

## 2012-04-10 MED ORDER — ONDANSETRON HCL 4 MG/2ML IJ SOLN: 4.0000 mg | Freq: Four times a day (QID) | INTRAMUSCULAR | Status: DC | PRN

## 2012-04-10 MED ORDER — INFLUENZA VIRUS VACC SPLIT PF IM SUSP
0.5000 mL | INTRAMUSCULAR | Status: AC
  Administered 2012-04-11: 0.5 mL via INTRAMUSCULAR
  Filled 2012-04-10: qty 0.5

## 2012-04-10 MED ORDER — SODIUM CHLORIDE 0.9 % IJ SOLN: 3.0000 mL | INTRAMUSCULAR | Status: DC | PRN

## 2012-04-10 MED ORDER — SODIUM CHLORIDE 0.9 % IV SOLN
250.0000 mL | INTRAVENOUS | Status: DC | PRN
Start: 2012-04-10 — End: 2012-04-12

## 2012-04-10 MED ORDER — ACETAMINOPHEN 325 MG PO TABS: 650.0000 mg | ORAL_TABLET | ORAL | Status: DC | PRN

## 2012-04-10 MED ORDER — SENNOSIDES-DOCUSATE SODIUM 8.6-50 MG PO TABS
1.0000 | ORAL_TABLET | Freq: Every evening | ORAL | Status: DC | PRN
  Filled 2012-04-10: qty 1

## 2012-04-10 MED ORDER — TAMSULOSIN HCL 0.4 MG PO CAPS
0.4000 mg | ORAL_CAPSULE | Freq: Every day | ORAL | Status: DC
  Administered 2012-04-10 – 2012-04-12 (×3): 0.4 mg via ORAL
  Filled 2012-04-10 (×3): qty 1

## 2012-04-10 MED ORDER — ACETAMINOPHEN 650 MG RE SUPP: 650.0000 mg | RECTAL | Status: DC | PRN

## 2012-04-10 MED ORDER — DARIFENACIN HYDROBROMIDE ER 15 MG PO TB24
15.0000 mg | ORAL_TABLET | Freq: Every day | ORAL | Status: DC
  Administered 2012-04-10 – 2012-04-12 (×3): 15 mg via ORAL
  Filled 2012-04-10 (×3): qty 1

## 2012-04-10 MED ORDER — INSULIN ASPART 100 UNIT/ML ~~LOC~~ SOLN
0.0000 [IU] | Freq: Three times a day (TID) | SUBCUTANEOUS | Status: DC
  Administered 2012-04-10: 3 [IU] via SUBCUTANEOUS
  Administered 2012-04-11: 5 [IU] via SUBCUTANEOUS
  Administered 2012-04-12: 3 [IU] via SUBCUTANEOUS

## 2012-04-10 MED ORDER — INSULIN DETEMIR 100 UNIT/ML ~~LOC~~ SOLN
30.0000 [IU] | Freq: Two times a day (BID) | SUBCUTANEOUS | Status: DC
  Administered 2012-04-10 – 2012-04-11 (×2): 30 [IU] via SUBCUTANEOUS
  Filled 2012-04-10: qty 10

## 2012-04-10 MED ORDER — AMLODIPINE BESYLATE 5 MG PO TABS
5.0000 mg | ORAL_TABLET | Freq: Every day | ORAL | Status: DC
  Administered 2012-04-10 – 2012-04-12 (×3): 5 mg via ORAL
  Filled 2012-04-10 (×3): qty 1

## 2012-04-10 MED ORDER — GABAPENTIN 400 MG PO CAPS
800.0000 mg | ORAL_CAPSULE | Freq: Three times a day (TID) | ORAL | Status: DC
  Administered 2012-04-10 – 2012-04-12 (×7): 800 mg via ORAL
  Filled 2012-04-10 (×8): qty 2

## 2012-04-10 MED ORDER — ASPIRIN 325 MG PO TABS
325.0000 mg | ORAL_TABLET | Freq: Every day | ORAL | Status: DC
  Administered 2012-04-10 – 2012-04-12 (×3): 325 mg via ORAL
  Filled 2012-04-10 (×3): qty 1

## 2012-04-10 MED ORDER — INSULIN ASPART 100 UNIT/ML ~~LOC~~ SOLN
0.0000 [IU] | Freq: Every day | SUBCUTANEOUS | Status: DC
  Administered 2012-04-10: 4 [IU] via SUBCUTANEOUS

## 2012-04-10 MED ORDER — SODIUM CHLORIDE 0.9 % IJ SOLN
3.0000 mL | Freq: Two times a day (BID) | INTRAMUSCULAR | Status: DC
  Administered 2012-04-10 – 2012-04-12 (×4): 3 mL via INTRAVENOUS

## 2012-04-10 NOTE — ED Notes (Signed)
Pt states he is having problems with his right leg  Pt states he is having weakness in his leg and it is difficult for him to ambulate on it  Pt uses a cane  Pt is diabetic No neuro deficits noted

## 2012-04-10 NOTE — ED Notes (Signed)
hospitalist at bedside

## 2012-04-10 NOTE — ED Notes (Signed)
Off floor for testing 

## 2012-04-10 NOTE — Care Management Note (Addendum)
    Page 1 of 1   04/12/2012     3:36:08 PM   CARE MANAGEMENT NOTE 04/12/2012  Patient:  Mike Mack, Mike Mack   Account Number:  [id number]  Date Initiated:  04/10/2012  Documentation initiated by:  Lanier Clam  Subjective/Objective Assessment:   ADMITTED W/R SIDE PARESTHESIAS.ZO:XWRUE,AVW,UJ     Action/Plan:   FROM HOME W/SPOUSE   Anticipated DC Date:  04/12/2012   Anticipated DC Plan:  IP REHAB FACILITY      DC Planning Services  CM consult      Choice offered to / List presented to:             Status of service:  Completed, signed off Medicare Important Message given?   (If response is "NO", the following Medicare IM given date fields will be blank) Date Medicare IM given:   Date Additional Medicare IM given:    Discharge Disposition:  IP REHAB FACILITY  Per UR Regulation:  Reviewed for med. necessity/level of care/duration of stay  If discussed at Long Length of Stay Meetings, dates discussed:    Comments:  04/12/12 [name] RN,BSN NCM [phone] PATIENT GOING TO [facility]RM 4030/RECEIVING MD-DR. SCHWARTZ.NURSE TO [name] [phone]. D/C SUMMARY,& D/C ORDER PUT IN.AWAITING RM/RECEIVING MD. IP REHAB APPROPRIATE.SPOKE TO PATIENT/SPOUSE-AGREEABLE.IP REHAB [facility]) WORKING ON AUTH FOR IP REHAB.LIKELY TO D/C TODAY.MD AGREE.WILL AWAIT RM/BED/RECEIVING MD.CARELINK FOR TRANSP TEL#[phone].NURSE TO CALL REPORT ONCE RM/BED/RECEING MD GIVEN TEL#[phone].  04/10/12 [name] RN,BSN NCM [phone]

## 2012-04-10 NOTE — H&P (Signed)
[facility] History and Physical  Mike Mack ZOX:[id number] DOB: 1936/01/15 DOA: 04/10/2012  Referring physician: Dr. Dana Allan PCP: Michiel Sites, MD   Chief Complaint: RLE weakness, RUE paresthesias   History of Present Illness: Mike Mack is an 76 y.o. male with a PMH of DM and HTN as well a remote history of polio as a child, who presents with worsening right lower extremity weakness over the past 2 days.  He also complains of RUE heaviness and clumsiness.  He denies any facial weakness/numbness, drooling, aphasia, dysphagia, visual changes, chest pain, palpitations, dyspnea, fainting, and headaches.   A CT of the head was done, which showed findings suggestive of normal pressure hydrocephalus.  An MRI was subsequently performed which showed an acute left thalamic stroke and findings suggestive of communicating hydrocephalus.  Review of Systems: Constitutional: No fever, no chills;  Appetite normal; No weight loss, no weight gain.  HEENT: No blurry vision, no diplopia, no pharyngitis, no dysphagia CV: No chest pain, no palpitations.  Resp: + occasional SOB, no cough. GI: No nausea, no vomiting, no diarrhea, no melena, no hematochezia.  GU: No dysuria, no hematuria.  MSK: no myalgias, no arthralgias.  Neuro:  No headache, + focal neurological deficits, no history of seizures.  Psych: No depression, no anxiety.  Endo: No thyroid disease, + DM, no heat intolerance, no cold intolerance, no polyuria, no polydipsia  Skin: No rashes, no skin lesions.  Heme: No easy bruising, no history of blood diseases.  Past Medical History Past Medical History  Diagnosis Date  . Diabetes mellitus without complication   . Hypertension   . Small bowel obstruction   . BPH (benign prostatic hyperplasia)   . Neuropathy      Past Surgical History Past Surgical History  Procedure Date  . Bowel resection   . Hernia repair   . Cataract extraction w/ intraocular lens  implant, bilateral       Social History: History   Social History  . Marital Status: Married    Spouse Name: Lyon Dumont    Number of Children: 2  . Years of Education: N/A   Occupational History  . Retired, Education administrator    Social History Main Topics  . Smoking status: Never Smoker   . Smokeless tobacco: Never Used  . Alcohol Use: No  . Drug Use: No  . Sexually Active: No   Other Topics Concern  . Not on file   Social History Narrative   Married.  Lives in [place] with wife.  Ambulates with a cane.    Family History:  Family History  Problem Relation Age of Onset  . Diabetes Sister   . Diabetes Sister   . Diabetes Brother   . Hypertension Sister   . Hypertension Sister   . Hypertension Brother   . Heart failure Mother   . Heart attack Sister     Allergies: Review of patient's allergies indicates no known allergies.  Meds: Prior to Admission medications   Medication Sig Start Date End Date Taking? Authorizing Provider  amLODipine (NORVASC) 5 MG tablet Take 5 mg by mouth daily.   Yes Historical Provider, MD  gabapentin (NEURONTIN) 400 MG capsule Take 800 mg by mouth 3 (three) times daily.   Yes Historical Provider, MD  insulin detemir (LEVEMIR) 100 UNIT/ML injection Inject 30 Units into the skin 2 (two) times daily.   Yes Historical Provider, MD  metFORMIN (GLUCOPHAGE) 1000 MG tablet Take 1,000 mg by mouth 2 (two) times daily with a  meal.   Yes Historical Provider, MD  solifenacin (VESICARE) 5 MG tablet Take 10 mg by mouth daily.   Yes Historical Provider, MD  Tamsulosin HCl (FLOMAX) 0.4 MG CAPS Take 0.4 mg by mouth.   Yes Historical Provider, MD    Physical Exam: Filed Vitals:   04/10/12 0558 04/10/12 0700 04/10/12 0853  BP: 165/91 145/78   Pulse: 83 74   Temp: 98.7 F (37.1 C)  97.8 F (36.6 C)  TempSrc: Oral    Resp: 16 16   SpO2: 100% 100%      Physical Exam: Blood pressure 145/78, pulse 74, temperature 97.8 F (36.6 C), temperature source Oral, resp. rate  16, SpO2 100.00%. BP 145/78  Pulse 74  Temp 97.8 F (36.6 C) (Oral)  Resp 16  SpO2 100%  General Appearance:    Alert, cooperative, no distress, appears stated age  Head:    Normocephalic, without obvious abnormality, atraumatic  Eyes:    PERRL, conjunctiva/corneas clear, EOM's intact    Ears:    Normal external ear canals, both ears  Nose:   Nares normal, septum midline, mucosa normal, no drainage    or sinus tenderness  Throat:   Lips, mucosa, and tongue normal; edentulous with upper dentures  Neck:   Supple, symmetrical, trachea midline, no adenopathy;       thyroid:  No enlargement/tenderness/nodules; no carotid   bruit or JVD  Back:     Symmetric, no curvature, ROM normal, no CVA tenderness  Lungs:     Clear to auscultation bilaterally, respirations unlabored  Chest wall:    No tenderness or deformity  Heart:    Regular rate and rhythm, S1 and S2 normal, no murmur, rub   or gallop  Abdomen:     Soft, non-tender, bowel sounds active all four quadrants,    no masses, no organomegaly  Extremities:   Extremities normal, atraumatic, no cyanosis or edema  Pulses:   2+ and symmetric all extremities  Skin:   Skin color, texture, turgor normal, no rashes or lesions  Lymph nodes:   Cervical, supraclavicular, and axillary nodes normal  Neurologic:   CNII-XII intact. Very subtle RUE weakness, no pronator drift, RLE weakness 2/5 strength, unable to lift RLE against gravity.    Labs on Admission:  Basic Metabolic Panel:  Lab 04/10/12 1610  NA 134*  K 4.5  CL 98  CO2 25  GLUCOSE 202*  BUN 10  CREATININE 0.99  CALCIUM 9.4  MG --  PHOS --   CBC:  Lab 04/10/12 0630  WBC 5.3  NEUTROABS --  HGB 12.0*  HCT 36.3*  MCV 84.8  PLT 306   CBG:  Lab 04/10/12 0630  GLUCAP 188*    Radiological Exams on Admission: Ct Head Wo Contrast  04/10/2012  *RADIOLOGY REPORT*  Clinical Data: Right leg weakness.  Right arm numbness.  CT HEAD WITHOUT CONTRAST  Technique:  Contiguous axial  images were obtained from the base of the skull through the vertex without contrast.  Comparison: None.  Findings: Moderate ventricular enlargement, more than expected for the level of  atrophy.  Third, fourth and lateral ventricles are all rounded and dilated suggestive of communicating hydrocephalus (normal pressure hydrocephalus).  Hypodensity in the cerebral white matter bilaterally may be due to subependymal resorption of CSF versus chronic microvascular ischemia.  No acute infarct.  No hemorrhage or mass lesion.  Chronic infarct cerebellum bilaterally. Calvarium intact.  IMPRESSION: Ventricular dilatation.  Findings are suggestive of normal pressure hydrocephalus.  Chronic cerebellar infarcts bilaterally.  No acute infarct.  Hypodensity in the cerebral white matter bilaterally may be subependymal resorption of CSF related to hydrocephalus versus chronic microvascular ischemia.   Original Report Authenticated By: Camelia Phenes, M.D.    Mr [facility] Wo Contrast  04/10/2012  *RADIOLOGY REPORT*  Clinical Data:  Weakness.  Sudden onset right-sided weakness and inability to walk beginning yesterday.  MRI HEAD WITHOUT AND WITH CONTRAST MRA HEAD WITHOUT CONTRAST  Technique:  Multiplanar, multiecho pulse sequences of the brain and surrounding structures were obtained without and with intravenous contrast.  Angiographic images of the head were obtained using MRA technique without contrast.  Contrast: 15mL MULTIHANCE GADOBENATE DIMEGLUMINE 529 MG/ML IV SOLN  Comparison:  CT head without contrast 04/10/2012.  MRI HEAD WITHOUT AND WITH CONTRAST  Findings:  The diffusion weighted images demonstrate an acute non hemorrhagic 10 mm infarct in the left thalamus.  Moderate ventricular dilation is evident.  There is prominence of the lateral and third ventricles.  The fourth ventricle is prominent as well.  The configuration is typical of communicating hydrocephalus. Periventricular T2 hyperintensities are somewhat atypical  for white matter change alone.  There may be an element of transependymal CSF flow.  Remote lacunar infarcts are noted in the cerebellum bilaterally. Extensive subcortical white matter disease is present bilaterally.  Flow is present in the major intracranial arteries.  The patient is status post bilateral lens extractions.  The globes and orbits are otherwise intact.  Mild circumferential mucosal thickening is present in the maxillary sinuses bilaterally.  The paranasal sinuses are otherwise clear. There is minimal fluid in the mastoid air cells, more prominently on the left.  The postcontrast images demonstrate no pathologic enhancement.  IMPRESSION:  1.  Acute non hemorrhagic 10 mm infarct of the left thalamus. 2.  Moderate dilation of the lateral, third, and fourth ventricles with potential transependymal CSF.  This is highly suggestive of communicating hydrocephalus. 3.  Multiple remote lacunar infarcts of the cerebellum, more prominently on the left. 4.  Advanced subcortical white matter changes bilaterally.  These likely reflect the sequelae of chronic microvascular ischemia.  MRA HEAD  Findings: The left internal carotid artery is within normal limits. A prominent left posterior communicating artery is noted.  A 2.3 mm downward outpouching is present at the right posterior communicating artery.  This likely represents an infundibulum with very small vessel seen distally.  The right A1 is dominant.  The A2 segment is azygos, a normal variant.  The left A1 and into communicating artery is small.  The MCA bifurcations are within normal limits.  The MCA branch vessels are unremarkable.  The right vertebral artery is slightly dominant to the left.  The PICA origins are visualized and normal bilaterally.  The basilar artery is within normal limits.  Both posterior cerebral arteries originate from basilar tip.  The PCA branch vessels are within normal limits.  IMPRESSION:  1.  Infundibulum of the right posterior  communicating artery. 2.  No significant proximal stenosis, aneurysm, or branch vessel occlusion.   Original Report Authenticated By: Jamesetta Orleans. MATTERN, M.D.    Mr [facility] ZO Contrast  04/10/2012  *RADIOLOGY REPORT*  Clinical Data:  Weakness.  Sudden onset right-sided weakness and inability to walk beginning yesterday.  MRI HEAD WITHOUT AND WITH CONTRAST MRA HEAD WITHOUT CONTRAST  Technique:  Multiplanar, multiecho pulse sequences of the brain and surrounding structures were obtained without and with intravenous contrast.  Angiographic images of the head were  obtained using MRA technique without contrast.  Contrast: 15mL MULTIHANCE GADOBENATE DIMEGLUMINE 529 MG/ML IV SOLN  Comparison:  CT head without contrast 04/10/2012.  MRI HEAD WITHOUT AND WITH CONTRAST  Findings:  The diffusion weighted images demonstrate an acute non hemorrhagic 10 mm infarct in the left thalamus.  Moderate ventricular dilation is evident.  There is prominence of the lateral and third ventricles.  The fourth ventricle is prominent as well.  The configuration is typical of communicating hydrocephalus. Periventricular T2 hyperintensities are somewhat atypical for white matter change alone.  There may be an element of transependymal CSF flow.  Remote lacunar infarcts are noted in the cerebellum bilaterally. Extensive subcortical white matter disease is present bilaterally.  Flow is present in the major intracranial arteries.  The patient is status post bilateral lens extractions.  The globes and orbits are otherwise intact.  Mild circumferential mucosal thickening is present in the maxillary sinuses bilaterally.  The paranasal sinuses are otherwise clear. There is minimal fluid in the mastoid air cells, more prominently on the left.  The postcontrast images demonstrate no pathologic enhancement.  IMPRESSION:  1.  Acute non hemorrhagic 10 mm infarct of the left thalamus. 2.  Moderate dilation of the lateral, third, and fourth ventricles  with potential transependymal CSF.  This is highly suggestive of communicating hydrocephalus. 3.  Multiple remote lacunar infarcts of the cerebellum, more prominently on the left. 4.  Advanced subcortical white matter changes bilaterally.  These likely reflect the sequelae of chronic microvascular ischemia.  MRA HEAD  Findings: The left internal carotid artery is within normal limits. A prominent left posterior communicating artery is noted.  A 2.3 mm downward outpouching is present at the right posterior communicating artery.  This likely represents an infundibulum with very small vessel seen distally.  The right A1 is dominant.  The A2 segment is azygos, a normal variant.  The left A1 and into communicating artery is small.  The MCA bifurcations are within normal limits.  The MCA branch vessels are unremarkable.  The right vertebral artery is slightly dominant to the left.  The PICA origins are visualized and normal bilaterally.  The basilar artery is within normal limits.  Both posterior cerebral arteries originate from basilar tip.  The PCA branch vessels are within normal limits.  IMPRESSION:  1.  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 2.  No significant proximal stenosis, aneurysm, or branch vessel occlusion.   Original Report Authenticated By: Jamesetta Orleans. MATTERN, M.D.     EKG: Independently reviewed.Sinus rhythm~ normal P axis, V-rate 50- 99; PROBABLE LEFT ATRIAL ABNORMALITY ~ P >24mS, <-0.71mV V1; CONSIDER ANTEROSEPTAL INFARCT ~ Q >14mS, V1 V2  Assessment/Plan Principal Problem:  *Acute left thalamic CVA (cerebral infarction)  Admit as observation status to a telemetry bed.  Obtain carotid Dopplers and a two-dimensional echocardiogram.  Neurology consultation has been requested. Discussed the patient's presentation with Dr. Thad Ranger.  Continue daily aspirin.  Check lipid panel and initiate therapy if indicated.  Check hemoglobin A1c to determine overall glycemic  control and adjust therapy if indicated.  Monitor blood pressure and adjust therapy for good blood pressure control if indicated. Active Problems:  Diabetes mellitus   Followup hemoglobin A1c.  Continue usual home dose of Levemir.  Hold metformin while in the hospital and use moderate scale SS I.  Hypertension  Continue Norvasc and monitor blood pressure closely.  BPH (benign prostatic hyperplasia)  Continue Flomax and Vesicare.  Neuropathy  Continue Neurontin.  Code Status: Full. Family  Communication: Mike Mack [phone] (wife) Disposition Plan: Home, when stable.  Time spent: 1 hour.  RAMA,CHRISTINA [facility] Pager [phone]  If 7PM-7AM, please contact night-coverage [email or web address] Password [facility] 04/10/2012, 12:53 PM

## 2012-04-10 NOTE — ED Notes (Signed)
MD at bedside. 

## 2012-04-10 NOTE — ED Provider Notes (Signed)
History     CSN: [id number]  Arrival date & time 04/10/12  7829   First MD Initiated Contact with Patient 04/10/12 [phone]      Chief Complaint  Patient presents with  . Extremity Weakness    (Consider location/radiation/quality/duration/timing/severity/associated sxs/prior treatment) HPI Comments: [name] presents ambulatory with his wife evaluation of right leg weakness. He reports that he has a distant history of polio as a child and has always had some weakness in his right leg were over the last 2 days he is unable to bear weight on leg. [place] feels as if his as if his leg gives out He reports it feels as if his leg just gives out.  He is unable to lift it against gravity.  He also states he is having numbness in his right hand and feels as if it is slightly heavy and clumsy.  He denies any facial weakness/numbness, drooling, aphasia, dysphagia, visual changes, chest pain, palpitations, dyspnea, fainting, and headaches.  He states he otherwise feels well and has never had any similar previous weakness.   Past Medical History  Diagnosis Date  . Diabetes mellitus without complication   . Hypertension   . Small bowel obstruction     Past Surgical History  Procedure Date  . Bowel resection   . Hernia repair     Family History  Problem Relation Age of Onset  . Diabetes Other   . Hypertension Other     History  Substance Use Topics  . Smoking status: Never Smoker   . Smokeless tobacco: Not on file  . Alcohol Use: No      Review of Systems  Constitutional: Negative for fever, chills, diaphoresis, activity change, appetite change and fatigue.  HENT: Negative for hearing loss, ear pain, congestion, sore throat, facial swelling, rhinorrhea, drooling, trouble swallowing, neck pain, neck stiffness and voice change.   Eyes: Negative for visual disturbance.  Respiratory: Negative for cough, chest tightness and shortness of breath.   Cardiovascular: Negative for chest  pain and palpitations.  Gastrointestinal: Negative for nausea, vomiting, abdominal pain and diarrhea.  Genitourinary: Negative for dysuria and difficulty urinating.  Musculoskeletal: Positive for gait problem. Negative for myalgias, joint swelling and arthralgias.  Skin: Negative for pallor and rash.  Neurological: Positive for weakness and numbness. Negative for dizziness, seizures, syncope, facial asymmetry, speech difficulty, light-headedness and headaches.  Psychiatric/Behavioral: Negative for confusion and dysphoric mood. The patient is not nervous/anxious.     Allergies  Review of patient's allergies indicates no known allergies.  Home Medications   Current Outpatient Rx  Name Route Sig Dispense Refill  . AMLODIPINE BESYLATE 5 MG PO TABS Oral Take 5 mg by mouth daily.    Marland Kitchen GABAPENTIN 400 MG PO CAPS Oral Take 800 mg by mouth 3 (three) times daily.    . INSULIN DETEMIR 100 UNIT/ML [place] SOLN Subcutaneous Inject 30 Units into the skin 2 (two) times daily.    Marland Kitchen METFORMIN HCL 1000 MG PO TABS Oral Take 1,000 mg by mouth 2 (two) times daily with a meal.    . SOLIFENACIN SUCCINATE 5 MG PO TABS Oral Take 10 mg by mouth daily.    Marland Kitchen TAMSULOSIN HCL 0.4 MG PO CAPS Oral Take 0.4 mg by mouth.      BP 145/78  Pulse 74  Temp 98.7 F (37.1 C) (Oral)  Resp 16  SpO2 100%  Physical Exam  Nursing note and vitals reviewed. Constitutional: He is oriented to person, place, and time. He  appears well-developed and well-nourished. No distress.  HENT:  Head: Normocephalic and atraumatic.  Right Ear: External ear normal.  Left Ear: External ear normal.  Nose: Nose normal.  Mouth/Throat: Oropharynx is clear and moist. No oropharyngeal exudate.  Eyes: Conjunctivae normal and EOM are normal. Pupils are equal, round, and reactive to light. Right eye exhibits no discharge. Left eye exhibits no discharge. No scleral icterus.  Neck: Normal range of motion. Neck supple. No JVD present. No tracheal deviation  present.  Cardiovascular: Normal rate, regular rhythm, normal heart sounds and intact distal pulses.  Exam reveals no gallop and no friction rub.   No murmur heard. Pulmonary/Chest: Effort normal and breath sounds normal. No stridor. No respiratory distress. He has no wheezes. He has no rales. He exhibits no tenderness.  Abdominal: Soft. Bowel sounds are normal. He exhibits no distension and no mass. There is no tenderness. There is no rebound and no guarding.  Musculoskeletal: Normal range of motion. He exhibits no edema and no tenderness.  Lymphadenopathy:    He has no cervical adenopathy.  Neurological: He is alert and oriented to person, place, and time. He is not disoriented. He displays atrophy (mild right leg atrophy). He displays no tremor. A sensory deficit (vague numbness on right hand) is present. He exhibits abnormal muscle tone (right leg, unable to lift against gravity or hold up even with assistance.  diminished dorsiflexion and plantarflexion also.). He displays no seizure activity. Coordination (multiple misses with right hand on finger to nose.  reduced speed without misses on finger to nose with left hand) abnormal. GCS eye subscore is 4. GCS verbal subscore is 5. GCS motor subscore is 6. He displays no Babinski's sign on the right side. He displays no Babinski's sign on the left side.       Mild tongue deviation to the left.  Skin: Skin is warm and dry. No rash noted. He is not diaphoretic. No erythema. No pallor.  Psychiatric: He has a normal mood and affect. His behavior is normal.    ED Course  Procedures (including critical care time)  Labs Reviewed  CBC - Abnormal; Notable for the following:    Hemoglobin 12.0 (*)     HCT 36.3 (*)     All other components within normal limits  BASIC METABOLIC PANEL - Abnormal; Notable for the following:    Sodium 134 (*)     Glucose, Bld 202 (*)     GFR calc non Af Amer 79 (*)     All other components within normal limits  GLUCOSE,  CAPILLARY - Abnormal; Notable for the following:    Glucose-Capillary 188 (*)     All other components within normal limits   Ct Head Wo Contrast  04/10/2012  *RADIOLOGY REPORT*  Clinical Data: Right leg weakness.  Right arm numbness.  CT HEAD WITHOUT CONTRAST  Technique:  Contiguous axial images were obtained from the base of the skull through the vertex without contrast.  Comparison: None.  Findings: Moderate ventricular enlargement, more than expected for the level of  atrophy.  Third, fourth and lateral ventricles are all rounded and dilated suggestive of communicating hydrocephalus (normal pressure hydrocephalus).  Hypodensity in the cerebral white matter bilaterally may be due to subependymal resorption of CSF versus chronic microvascular ischemia.  No acute infarct.  No hemorrhage or mass lesion.  Chronic infarct cerebellum bilaterally. Calvarium intact.  IMPRESSION: Ventricular dilatation.  Findings are suggestive of normal pressure hydrocephalus.  Chronic cerebellar infarcts bilaterally.  No  acute infarct.  Hypodensity in the cerebral white matter bilaterally may be subependymal resorption of CSF related to hydrocephalus versus chronic microvascular ischemia.   Original Report Authenticated By: Camelia Phenes, M.D.      No diagnosis found.    MDM  Pt presents for evaluation of right-sided weakness.  He reports he had been able to ambulate with his cane with minimal difficulty up to 2 days ago, however he is unable to stand on the leg.  He also has right hand numbness.  He is otherwise alert and oriented, NAD.  Note slight deviation of his tongue to the left.  He also has significant right leg weakness and poor coordination with his right hand on finger to nose.  He has a CT scan that demonstrates chronic cerebellar infarcts and ventricular dilatation.  His history and exam suggests he has developed a new deficit however he is out of the time window to receive thrombolytic therapy.  Will  administer po aspirin.  Will contact the hospitalist for admission.  1610.  Pt stable, NAD.  Discussed his evaluation with Dr. Darnelle Catalan (hospitalist).  Plan admit for further evaluation.        Tobin Chad, MD 04/10/12 [phone]

## 2012-04-10 NOTE — ED Notes (Signed)
Attempted to call report.  Floor RN with a Pt and will return call in 5 mins.

## 2012-04-10 NOTE — Consult Note (Signed)
Reason for Consult: Acute infarct Referring Physician: Rama  CC: Right sided weakness  HPI: Mike Mack is an 76 y.o. male with a history of polio.  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  On last afternoon ( patient can not give me an exact time) had the onset weakness in his right leg and numbness in his right arm that he noted on attempting to stand.  Reports that he is now unable to bear weight on his right leg.  Have reviewed MRI of the brain that shows an acute left thalamic infarct.  MRI was also felt by radiology to show evidence of communicating hydrocephalus.   Patient reports that doe the past few months he has not had incontinence but feels that when he has to go the bathroom he really has to go at that time and can not wait.  He has no cognitive issues.    LSN: Unknown tPA Given: No; outside time window   Past Medical History  Diagnosis Date  . Diabetes mellitus without complication   . Hypertension   . Small bowel obstruction   . BPH (benign prostatic hyperplasia)   . Neuropathy     Past Surgical History  Procedure Date  . Bowel resection   . Hernia repair   . Cataract extraction w/ intraocular lens  implant, bilateral     Family History  Problem Relation Age of Onset  . Diabetes Sister   . Diabetes Sister   . Diabetes Brother   . Hypertension Sister   . Hypertension Sister   . Hypertension Brother   . Heart failure Mother   . Heart attack Sister     Social History:  reports that he has never smoked. He has never used smokeless tobacco. He reports that he does not drink alcohol or use illicit drugs.  No Known Allergies  Medications:  I have reviewed the patient's current medications. Prior to Admission:  Prescriptions prior to admission  Medication Sig Dispense Refill  . amLODipine (NORVASC) 5 MG tablet Take 5 mg by mouth daily.      Marland Kitchen gabapentin (NEURONTIN) 400 MG capsule Take 800 mg by mouth 3 (three) times daily.      .  insulin detemir (LEVEMIR) 100 UNIT/ML injection Inject 30 Units into the skin 2 (two) times daily.      . metFORMIN (GLUCOPHAGE) 1000 MG tablet Take 1,000 mg by mouth 2 (two) times daily with a meal.      . solifenacin (VESICARE) 5 MG tablet Take 10 mg by mouth daily.      . Tamsulosin HCl (FLOMAX) 0.4 MG CAPS Take 0.4 mg by mouth.       Scheduled:   . amLODipine  5 mg Oral Daily  . aspirin  325 mg Oral Daily  . darifenacin  15 mg Oral Daily  . enoxaparin  40 mg Subcutaneous Q24H  . gabapentin  800 mg Oral TID  . influenza  inactive virus vaccine  0.5 mL Intramuscular Tomorrow-1000  . insulin aspart  0-15 Units Subcutaneous TID WC  . insulin aspart  0-5 Units Subcutaneous QHS  . insulin detemir  30 Units Subcutaneous BID  . sodium chloride  3 mL Intravenous Q12H  . Tamsulosin HCl  0.4 mg Oral Daily    ROS: History obtained from the patient  General ROS: negative for - chills, fatigue, fever, night sweats, weight gain or weight loss Psychological ROS: negative for - behavioral disorder, hallucinations, memory difficulties, mood  swings or suicidal ideation Ophthalmic ROS: negative for - blurry vision, double vision, eye pain or loss of vision ENT ROS: negative for - epistaxis, nasal discharge, oral lesions, sore throat, tinnitus or vertigo Allergy and Immunology ROS: negative for - hives or itchy/watery eyes Hematological and Lymphatic ROS: negative for - bleeding problems, bruising or swollen lymph nodes Endocrine ROS: negative for - galactorrhea, hair pattern changes, polydipsia/polyuria or temperature intolerance Respiratory ROS: negative for - cough, hemoptysis, shortness of breath or wheezing Cardiovascular ROS: negative for - chest pain, dyspnea on exertion, edema or irregular heartbeat Gastrointestinal ROS: negative for - abdominal pain, diarrhea, hematemesis, nausea/vomiting or stool incontinence Genito-Urinary ROS: as noted in HPI Musculoskeletal ROS: negative for - joint  swelling or muscular weakness Neurological ROS: as noted in HPI Dermatological ROS: negative for rash and skin lesion changes  Physical Examination: Blood pressure 156/79, pulse 77, temperature 98.4 F (36.9 C), temperature source Oral, resp. rate 16, height 5\' 5"  (1.651 m), weight 64.5 kg (142 lb 3.2 oz), SpO2 100.00%.  Neurologic Examination Mental Status: Alert, oriented, thought content appropriate.  Speech fluent without evidence of aphasia.  Able to follow 3 step commands without difficulty. Cranial Nerves: II: Discs flat bilaterally; Visual fields grossly normal, pupils equal, round, reactive to light and accommodation III,IV, VI: ptosis not present, extra-ocular motions intact bilaterally V,VII: smile symmetric, facial light touch sensation normal bilaterally VIII: hearing normal bilaterally IX,X: gag reflex present XI: bilateral shoulder shrug XII: midline tongue extension Motor: Right : Upper extremity   5/5     Left:     Upper extremity   5/5  Lower extremity   1-2/5     Lower extremity   5/5 Tone and bulk:normal tone throughout; atrophy noted of the RLE Sensory: Pinprick and light touch intact throughout, bilaterally Deep Tendon Reflexes: 1+ and in the upper extremities and absent in the lower extremities  Plantars: Right: euivocal   Left: downgoing Cerebellar: Mild dysmetria with finger to nose using the RUE.  Not able to perform heel to shin with the RLE.  Heel to shin and finger to nose intact on the left.   CV: pulses palpable throughout   Laboratory Studies:   Basic Metabolic Panel:  Lab 04/10/12 4132  NA 134*  K 4.5  CL 98  CO2 25  GLUCOSE 202*  BUN 10  CREATININE 0.99  CALCIUM 9.4  MG --  PHOS --    Liver Function Tests:  Lab 04/10/12 1249  AST 21  ALT 13  ALKPHOS 113  BILITOT 0.3  PROT 7.4  ALBUMIN 3.7   No results found for this basename: LIPASE:5,AMYLASE:5 in the last 168 hours No results found for this basename: AMMONIA:3 in the last  168 hours  CBC:  Lab 04/10/12 0630  WBC 5.3  NEUTROABS --  HGB 12.0*  HCT 36.3*  MCV 84.8  PLT 306    Cardiac Enzymes: No results found for this basename: CKTOTAL:5,CKMB:5,CKMBINDEX:5,TROPONINI:5 in the last 168 hours  BNP: No components found with this basename: POCBNP:5  CBG:  Lab 04/10/12 0630  GLUCAP 188*    Microbiology: Results for orders placed during the hospital encounter of 04/28/09  STOOL CULTURE     Status: Normal   Collection Time   04/28/09  8:55 AM      Component Value Range Status Comment   Specimen Description STOOL   Final    Special Requests FLAGYL IMMUNE:NORM   Final    Culture     Final  Value: ABUNDANT STAPHYLOCOCCUS AUREUS     NO SALMONELLA, SHIGELLA, CAMPYLOBACTER, OR YERSINIA ISOLATED   Report Status 05/03/2009 FINAL   Final   OVA AND PARASITE EXAMINATION     Status: Normal   Collection Time   04/28/09  8:55 AM      Component Value Range Status Comment   Specimen Description STOOL   Final    Special Requests NONE   Final    Ova and parasites NO OVA OR PARASITES SEEN   Final    Report Status 04/30/2009 FINAL   Final   CLOSTRIDIUM DIFFICILE EIA     Status: Normal   Collection Time   04/28/09 11:59 PM      Component Value Range Status Comment   Specimen Description STOOL   Final    Special Requests FLAGYL IMMUNE:NORM   Final    C difficile Toxins A+B, EIA NEGATIVE   Final    Report Status 04/30/2009 FINAL   Final     Coagulation Studies: No results found for this basename: LABPROT:5,INR:5 in the last 72 hours  Urinalysis: No results found for this basename: COLORURINE:2,APPERANCEUR:2,LABSPEC:2,PHURINE:2,GLUCOSEU:2,HGBUR:2,BILIRUBINUR:2,KETONESUR:2,PROTEINUR:2,UROBILINOGEN:2,NITRITE:2,LEUKOCYTESUR:2 in the last 168 hours  Lipid Panel:     Component Value Date/Time   CHOL  Value: 71        ATP III CLASSIFICATION:  <200     mg/dL   Desirable  102-725  mg/dL   Borderline High  >=366    mg/dL   High        44/08/4740 1819   TRIG 48  05/03/2009 1819   HDL 36* 05/03/2009 1819   CHOLHDL 2.0 05/03/2009 1819   VLDL 10 05/03/2009 1819   LDLCALC  Value: 25        Total Cholesterol/HDL:CHD Risk Coronary Heart Disease Risk Table                     Men   Women  1/2 Average Risk   3.4   3.3  Average Risk       5.0   4.4  2 X Average Risk   9.6   7.1  3 X Average Risk  23.4   11.0        Use the calculated Patient Ratio above and the CHD Risk Table to determine the patient's CHD Risk.        ATP III CLASSIFICATION (LDL):  <100     mg/dL   Optimal  595-638  mg/dL   Near or Above                    Optimal  130-159  mg/dL   Borderline  756-433  mg/dL   High  >295     mg/dL   Very High 18/84/1660 1819    HgbA1C:  Lab Results  Component Value Date   HGBA1C  Value: 6.3 (NOTE) The ADA recommends the following therapeutic goal for glycemic control related to Hgb A1c measurement: Goal of therapy: <6.5 Hgb A1c  Reference: American Diabetes Association: Clinical Practice Recommendations 2010, Diabetes Care, 2010, 33: (Suppl  1).* 05/03/2009    Urine Drug Screen:   No results found for this basename: labopia, cocainscrnur, labbenz, amphetmu, thcu, labbarb    Alcohol Level: No results found for this basename: ETH:2 in the last 168 hours   Imaging: Ct Head Wo Contrast  04/10/2012  *RADIOLOGY REPORT*  Clinical Data: Right leg weakness.  Right arm numbness.  CT HEAD WITHOUT CONTRAST  Technique:  Contiguous  axial images were obtained from the base of the skull through the vertex without contrast.  Comparison: None.  Findings: Moderate ventricular enlargement, more than expected for the level of  atrophy.  Third, fourth and lateral ventricles are all rounded and dilated suggestive of communicating hydrocephalus (normal pressure hydrocephalus).  Hypodensity in the cerebral white matter bilaterally may be due to subependymal resorption of CSF versus chronic microvascular ischemia.  No acute infarct.  No hemorrhage or mass lesion.  Chronic infarct  cerebellum bilaterally. Calvarium intact.  IMPRESSION: Ventricular dilatation.  Findings are suggestive of normal pressure hydrocephalus.  Chronic cerebellar infarcts bilaterally.  No acute infarct.  Hypodensity in the cerebral white matter bilaterally may be subependymal resorption of CSF related to hydrocephalus versus chronic microvascular ischemia.   Original Report Authenticated By: Camelia Phenes, M.D.    [facility] Wo Contrast  04/10/2012  *RADIOLOGY REPORT*  Clinical Data:  Weakness.  Sudden onset right-sided weakness and inability to walk beginning yesterday.  MRI HEAD WITHOUT AND WITH CONTRAST MRA HEAD WITHOUT CONTRAST  Technique:  Multiplanar, multiecho pulse sequences of the brain and surrounding structures were obtained without and with intravenous contrast.  Angiographic images of the head were obtained using MRA technique without contrast.  Contrast: 15mL MULTIHANCE GADOBENATE DIMEGLUMINE 529 MG/ML IV SOLN  Comparison:  CT head without contrast 04/10/2012.  MRI HEAD WITHOUT AND WITH CONTRAST  Findings:  The diffusion weighted images demonstrate an acute non hemorrhagic 10 mm infarct in the left thalamus.  Moderate ventricular dilation is evident.  There is prominence of the lateral and third ventricles.  The fourth ventricle is prominent as well.  The configuration is typical of communicating hydrocephalus. Periventricular T2 hyperintensities are somewhat atypical for white matter change alone.  There may be an element of transependymal CSF flow.  Remote lacunar infarcts are noted in the cerebellum bilaterally. Extensive subcortical white matter disease is present bilaterally.  Flow is present in the major intracranial arteries.  The patient is status post bilateral lens extractions.  The globes and orbits are otherwise intact.  Mild circumferential mucosal thickening is present in the maxillary sinuses bilaterally.  The paranasal sinuses are otherwise clear. There is minimal fluid in the mastoid  air cells, more prominently on the left.  The postcontrast images demonstrate no pathologic enhancement.  IMPRESSION:  1.  Acute non hemorrhagic 10 mm infarct of the left thalamus. 2.  Moderate dilation of the lateral, third, and fourth ventricles with potential transependymal CSF.  This is highly suggestive of communicating hydrocephalus. 3.  Multiple remote lacunar infarcts of the cerebellum, more prominently on the left. 4.  Advanced subcortical white matter changes bilaterally.  These likely reflect the sequelae of chronic microvascular ischemia.  MRA HEAD  Findings: The left internal carotid artery is within normal limits. A prominent left posterior communicating artery is noted.  A 2.3 mm downward outpouching is present at the right posterior communicating artery.  This likely represents an infundibulum with very small vessel seen distally.  The right A1 is dominant.  The A2 segment is azygos, a normal variant.  The left A1 and into communicating artery is small.  The MCA bifurcations are within normal limits.  The MCA branch vessels are unremarkable.  The right vertebral artery is slightly dominant to the left.  The PICA origins are visualized and normal bilaterally.  The basilar artery is within normal limits.  Both posterior cerebral arteries originate from basilar tip.  The PCA branch vessels are within normal limits.  IMPRESSION:  1.  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 2.  No significant proximal stenosis, aneurysm, or branch vessel occlusion.   Original Report Authenticated By: Jamesetta Orleans. MATTERN, M.D.    Mr [facility] WR Contrast  04/10/2012  *RADIOLOGY REPORT*  Clinical Data:  Weakness.  Sudden onset right-sided weakness and inability to walk beginning yesterday.  MRI HEAD WITHOUT AND WITH CONTRAST MRA HEAD WITHOUT CONTRAST  Technique:  Multiplanar, multiecho pulse sequences of the brain and surrounding structures were obtained without and with intravenous contrast.   Angiographic images of the head were obtained using MRA technique without contrast.  Contrast: 15mL MULTIHANCE GADOBENATE DIMEGLUMINE 529 MG/ML IV SOLN  Comparison:  CT head without contrast 04/10/2012.  MRI HEAD WITHOUT AND WITH CONTRAST  Findings:  The diffusion weighted images demonstrate an acute non hemorrhagic 10 mm infarct in the left thalamus.  Moderate ventricular dilation is evident.  There is prominence of the lateral and third ventricles.  The fourth ventricle is prominent as well.  The configuration is typical of communicating hydrocephalus. Periventricular T2 hyperintensities are somewhat atypical for white matter change alone.  There may be an element of transependymal CSF flow.  Remote lacunar infarcts are noted in the cerebellum bilaterally. Extensive subcortical white matter disease is present bilaterally.  Flow is present in the major intracranial arteries.  The patient is status post bilateral lens extractions.  The globes and orbits are otherwise intact.  Mild circumferential mucosal thickening is present in the maxillary sinuses bilaterally.  The paranasal sinuses are otherwise clear. There is minimal fluid in the mastoid air cells, more prominently on the left.  The postcontrast images demonstrate no pathologic enhancement.  IMPRESSION:  1.  Acute non hemorrhagic 10 mm infarct of the left thalamus. 2.  Moderate dilation of the lateral, third, and fourth ventricles with potential transependymal CSF.  This is highly suggestive of communicating hydrocephalus. 3.  Multiple remote lacunar infarcts of the cerebellum, more prominently on the left. 4.  Advanced subcortical white matter changes bilaterally.  These likely reflect the sequelae of chronic microvascular ischemia.  MRA HEAD  Findings: The left internal carotid artery is within normal limits. A prominent left posterior communicating artery is noted.  A 2.3 mm downward outpouching is present at the right posterior communicating artery.  This  likely represents an infundibulum with very small vessel seen distally.  The right A1 is dominant.  The A2 segment is azygos, a normal variant.  The left A1 and into communicating artery is small.  The MCA bifurcations are within normal limits.  The MCA branch vessels are unremarkable.  The right vertebral artery is slightly dominant to the left.  The PICA origins are visualized and normal bilaterally.  The basilar artery is within normal limits.  Both posterior cerebral arteries originate from basilar tip.  The PCA branch vessels are within normal limits.  IMPRESSION:  1.  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 2.  No significant proximal stenosis, aneurysm, or branch vessel occlusion.   Original Report Authenticated By: Jamesetta Orleans. MATTERN, M.D.      Assessment/Plan:  Patient Active Hospital Problem List: Acute left thalamic CVA (cerebral infarction) (04/10/2012)   Assessment: 76 year old male with multiple risk factors presenting with right sided weakness and a right thalamic infarct.  Has a history of polio and premorbid right sided weakness.  Imaging also suggests a NPH.  Patient has no symptoms of such and do not feel that further work up recommended for  this disorder at this time.    Stroke Risk Factors:  HTN, DM    Plan:  1. HgbA1c, fasting lipid panel 2. PT consult, OT consult 3. Echocardiogram 4. Carotid dopplers 5. Prophylactic therapy-Antiplatelet med: Aspirin - dose 325mg  daily 6. Risk factor modification 7. Telemetry monitoring 8. Frequent neuro checks   Thana Farr, MD [facility] [phone] 04/10/2012, 4:19 PM

## 2012-04-10 NOTE — ED Notes (Signed)
When putting pt on the stretcher from wheelchair pt has marked weakness noted to his right leg and very unsteady when standing   Acuity increased to a level 2

## 2012-04-11 DIAGNOSIS — I10 Essential (primary) hypertension: Secondary | ICD-10-CM

## 2012-04-11 DIAGNOSIS — R5383 Other fatigue: Secondary | ICD-10-CM

## 2012-04-11 DIAGNOSIS — E119 Type 2 diabetes mellitus without complications: Secondary | ICD-10-CM

## 2012-04-11 DIAGNOSIS — I6350 Cerebral infarction due to unspecified occlusion or stenosis of unspecified cerebral artery: Secondary | ICD-10-CM

## 2012-04-11 LAB — GLUCOSE, CAPILLARY: Glucose-Capillary: 89 mg/dL (ref 70–99)

## 2012-04-11 LAB — LIPID PANEL
Cholesterol: 157 mg/dL (ref 0–200)
Triglycerides: 89 mg/dL (ref <150)
VLDL: 18 mg/dL (ref 0–40)

## 2012-04-11 LAB — HEMOGLOBIN A1C
Hgb A1c MFr Bld: 7.9 % — ABNORMAL HIGH (ref <5.7)
Mean Plasma Glucose: 180 mg/dL — ABNORMAL HIGH (ref <117)

## 2012-04-11 MED ORDER — ATORVASTATIN CALCIUM 20 MG PO TABS
20.0000 mg | ORAL_TABLET | Freq: Every day | ORAL | Status: DC
  Administered 2012-04-11: 20 mg via ORAL
  Filled 2012-04-11 (×2): qty 1

## 2012-04-11 MED ORDER — INSULIN DETEMIR 100 UNIT/ML ~~LOC~~ SOLN
32.0000 [IU] | Freq: Two times a day (BID) | SUBCUTANEOUS | Status: DC
  Administered 2012-04-11 – 2012-04-12 (×2): 32 [IU] via SUBCUTANEOUS
  Filled 2012-04-11: qty 10

## 2012-04-11 NOTE — Progress Notes (Signed)
Rehab Admissions Coordinator Note:  Patient was screened by Clois Dupes for appropriateness for an Inpatient Acute Rehab Consult. Noted therapy recommendations and inpt rehab consult already placed. Roderic Palau will follow up with pt after rehab physician completes his assessment. [phone].   Clois Dupes, RN 04/11/2012, 3:17 PM  I can be reached at [phone].

## 2012-04-11 NOTE — Progress Notes (Signed)
  Echocardiogram 2D Echocardiogram has been performed.  Mike Mack 04/11/2012, 9:31 AM

## 2012-04-11 NOTE — Progress Notes (Signed)
Subjective: No new focal complaints.  Patient working with therapy.  Objective: Current vital signs: BP 152/82  Pulse 89  Temp 98.1 F (36.7 C) (Oral)  Resp 18  Ht 5\' 5"  (1.651 m)  Wt 64.5 kg (142 lb 3.2 oz)  BMI 23.66 kg/m2  SpO2 94% Vital signs in last 24 hours: Temp:  [97.5 F (36.4 C)-98.2 F (36.8 C)] 98.1 F (36.7 C) (10/24 1832) Pulse Rate:  [62-89] 89  (10/24 1832) Resp:  [16-18] 18  (10/24 1832) BP: (85-158)/(47-82) 152/82 mmHg (10/24 1832) SpO2:  [94 %-100 %] 94 % (10/24 1832)  Intake/Output from previous day: 10/23 0701 - 10/24 0700 In: 3 [I.V.:3] Out: 1450 [Urine:1450] Intake/Output this shift: Total I/O In: 600 [P.O.:600] Out: 525 [Urine:525] Nutritional status: Carb Control  Neurologic Exam: Mental Status:  Alert, oriented, thought content appropriate. Speech fluent without evidence of aphasia. Able to follow 3 step commands without difficulty.  Cranial Nerves:  II: Discs flat bilaterally; Visual fields grossly normal, pupils equal, round, reactive to light and accommodation  III,IV, VI: ptosis not present, extra-ocular motions intact bilaterally  V,VII: smile symmetric, facial light touch sensation normal bilaterally  VIII: hearing normal bilaterally  IX,X: gag reflex present  XI: bilateral shoulder shrug  XII: midline tongue extension  Motor:  Right : Upper extremity 5/5         Left: Upper extremity 5/5   Lower extremity 1-2/5     Lower extremity 5/5  Tone and bulk:normal tone throughout; atrophy noted of the RLE  Sensory: Pinprick and light touch intact throughout, bilaterally  Deep Tendon Reflexes: 1+ and in the upper extremities and absent in the lower extremities  Plantars:  Right: euivocal    Left: downgoing  Cerebellar:  Mild dysmetria with finger to nose using the RUE. Not able to perform heel to shin with the RLE. Heel to shin and finger to nose intact on the left.   Lab Results: Basic Metabolic Panel:  Lab 04/10/12 1610  NA 134*    K 4.5  CL 98  CO2 25  GLUCOSE 202*  BUN 10  CREATININE 0.99  CALCIUM 9.4  MG --  PHOS --    Liver Function Tests:  Lab 04/10/12 1249  AST 21  ALT 13  ALKPHOS 113  BILITOT 0.3  PROT 7.4  ALBUMIN 3.7   No results found for this basename: LIPASE:5,AMYLASE:5 in the last 168 hours No results found for this basename: AMMONIA:3 in the last 168 hours  CBC:  Lab 04/10/12 0630  WBC 5.3  NEUTROABS --  HGB 12.0*  HCT 36.3*  MCV 84.8  PLT 306    Cardiac Enzymes: No results found for this basename: CKTOTAL:5,CKMB:5,CKMBINDEX:5,TROPONINI:5 in the last 168 hours  Lipid Panel:  Lab 04/11/12 0436  CHOL 157  TRIG 89  HDL 46  CHOLHDL 3.4  VLDL 18  LDLCALC 93    CBG:  Lab 04/11/12 1712 04/11/12 1140 04/11/12 0757 04/10/12 2103 04/10/12 1634  GLUCAP 88 207* 89 306* 187*    Microbiology: Results for orders placed during the hospital encounter of 04/28/09  STOOL CULTURE     Status: Normal   Collection Time   04/28/09  8:55 AM      Component Value Range Status Comment   Specimen Description STOOL   Final    Special Requests FLAGYL IMMUNE:NORM   Final    Culture     Final    Value: ABUNDANT STAPHYLOCOCCUS AUREUS     NO SALMONELLA, SHIGELLA, CAMPYLOBACTER,  OR YERSINIA ISOLATED   Report Status 05/03/2009 FINAL   Final   OVA AND PARASITE EXAMINATION     Status: Normal   Collection Time   04/28/09  8:55 AM      Component Value Range Status Comment   Specimen Description STOOL   Final    Special Requests NONE   Final    Ova and parasites NO OVA OR PARASITES SEEN   Final    Report Status 04/30/2009 FINAL   Final   CLOSTRIDIUM DIFFICILE EIA     Status: Normal   Collection Time   04/28/09 11:59 PM      Component Value Range Status Comment   Specimen Description STOOL   Final    Special Requests FLAGYL IMMUNE:NORM   Final    C difficile Toxins A+B, EIA NEGATIVE   Final    Report Status 04/30/2009 FINAL   Final     Coagulation Studies: No results found for this  basename: LABPROT:5,INR:5 in the last 72 hours  Imaging: Ct Head Wo Contrast  04/10/2012  *RADIOLOGY REPORT*  Clinical Data: Right leg weakness.  Right arm numbness.  CT HEAD WITHOUT CONTRAST  Technique:  Contiguous axial images were obtained from the base of the skull through the vertex without contrast.  Comparison: None.  Findings: Moderate ventricular enlargement, more than expected for the level of  atrophy.  Third, fourth and lateral ventricles are all rounded and dilated suggestive of communicating hydrocephalus (normal pressure hydrocephalus).  Hypodensity in the cerebral white matter bilaterally may be due to subependymal resorption of CSF versus chronic microvascular ischemia.  No acute infarct.  No hemorrhage or mass lesion.  Chronic infarct cerebellum bilaterally. Calvarium intact.  IMPRESSION: Ventricular dilatation.  Findings are suggestive of normal pressure hydrocephalus.  Chronic cerebellar infarcts bilaterally.  No acute infarct.  Hypodensity in the cerebral white matter bilaterally may be subependymal resorption of CSF related to hydrocephalus versus chronic microvascular ischemia.   Original Report Authenticated By: Camelia Phenes, M.D.    [facility] Contrast  04/10/2012  *RADIOLOGY REPORT*  Clinical Data:  Weakness.  Sudden onset right-sided weakness and inability to walk beginning yesterday.  MRI HEAD WITHOUT AND WITH CONTRAST MRA HEAD WITHOUT CONTRAST  Technique:  Multiplanar, multiecho pulse sequences of the brain and surrounding structures were obtained without and with intravenous contrast.  Angiographic images of the head were obtained using MRA technique without contrast.  Contrast: 15mL MULTIHANCE GADOBENATE DIMEGLUMINE 529 MG/ML IV SOLN  Comparison:  CT head without contrast 04/10/2012.  MRI HEAD WITHOUT AND WITH CONTRAST  Findings:  The diffusion weighted images demonstrate an acute non hemorrhagic 10 mm infarct in the left thalamus.  Moderate ventricular dilation is  evident.  There is prominence of the lateral and third ventricles.  The fourth ventricle is prominent as well.  The configuration is typical of communicating hydrocephalus. Periventricular T2 hyperintensities are somewhat atypical for white matter change alone.  There may be an element of transependymal CSF flow.  Remote lacunar infarcts are noted in the cerebellum bilaterally. Extensive subcortical white matter disease is present bilaterally.  Flow is present in the major intracranial arteries.  The patient is status post bilateral lens extractions.  The globes and orbits are otherwise intact.  Mild circumferential mucosal thickening is present in the maxillary sinuses bilaterally.  The paranasal sinuses are otherwise clear. There is minimal fluid in the mastoid air cells, more prominently on the left.  The postcontrast images demonstrate no pathologic enhancement.  IMPRESSION:  1.  Acute non hemorrhagic 10 mm infarct of the left thalamus. 2.  Moderate dilation of the lateral, third, and fourth ventricles with potential transependymal CSF.  This is highly suggestive of communicating hydrocephalus. 3.  Multiple remote lacunar infarcts of the cerebellum, more prominently on the left. 4.  Advanced subcortical white matter changes bilaterally.  These likely reflect the sequelae of chronic microvascular ischemia.  MRA HEAD  Findings: The left internal carotid artery is within normal limits. A prominent left posterior communicating artery is noted.  A 2.3 mm downward outpouching is present at the right posterior communicating artery.  This likely represents an infundibulum with very small vessel seen distally.  The right A1 is dominant.  The A2 segment is azygos, a normal variant.  The left A1 and into communicating artery is small.  The MCA bifurcations are within normal limits.  The MCA branch vessels are unremarkable.  The right vertebral artery is slightly dominant to the left.  The PICA origins are visualized and  normal bilaterally.  The basilar artery is within normal limits.  Both posterior cerebral arteries originate from basilar tip.  The PCA branch vessels are within normal limits.  IMPRESSION:  1.  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 2.  No significant proximal stenosis, aneurysm, or branch vessel occlusion.   Original Report Authenticated By: Jamesetta Orleans. MATTERN, M.D.    Mr [facility] ZO Contrast  04/10/2012  *RADIOLOGY REPORT*  Clinical Data:  Weakness.  Sudden onset right-sided weakness and inability to walk beginning yesterday.  MRI HEAD WITHOUT AND WITH CONTRAST MRA HEAD WITHOUT CONTRAST  Technique:  Multiplanar, multiecho pulse sequences of the brain and surrounding structures were obtained without and with intravenous contrast.  Angiographic images of the head were obtained using MRA technique without contrast.  Contrast: 15mL MULTIHANCE GADOBENATE DIMEGLUMINE 529 MG/ML IV SOLN  Comparison:  CT head without contrast 04/10/2012.  MRI HEAD WITHOUT AND WITH CONTRAST  Findings:  The diffusion weighted images demonstrate an acute non hemorrhagic 10 mm infarct in the left thalamus.  Moderate ventricular dilation is evident.  There is prominence of the lateral and third ventricles.  The fourth ventricle is prominent as well.  The configuration is typical of communicating hydrocephalus. Periventricular T2 hyperintensities are somewhat atypical for white matter change alone.  There may be an element of transependymal CSF flow.  Remote lacunar infarcts are noted in the cerebellum bilaterally. Extensive subcortical white matter disease is present bilaterally.  Flow is present in the major intracranial arteries.  The patient is status post bilateral lens extractions.  The globes and orbits are otherwise intact.  Mild circumferential mucosal thickening is present in the maxillary sinuses bilaterally.  The paranasal sinuses are otherwise clear. There is minimal fluid in the mastoid air cells, more  prominently on the left.  The postcontrast images demonstrate no pathologic enhancement.  IMPRESSION:  1.  Acute non hemorrhagic 10 mm infarct of the left thalamus. 2.  Moderate dilation of the lateral, third, and fourth ventricles with potential transependymal CSF.  This is highly suggestive of communicating hydrocephalus. 3.  Multiple remote lacunar infarcts of the cerebellum, more prominently on the left. 4.  Advanced subcortical white matter changes bilaterally.  These likely reflect the sequelae of chronic microvascular ischemia.  MRA HEAD  Findings: The left internal carotid artery is within normal limits. A prominent left posterior communicating artery is noted.  A 2.3 mm downward outpouching is present at the right posterior communicating artery.  This likely represents an infundibulum with very small vessel seen distally.  The right A1 is dominant.  The A2 segment is azygos, a normal variant.  The left A1 and into communicating artery is small.  The MCA bifurcations are within normal limits.  The MCA branch vessels are unremarkable.  The right vertebral artery is slightly dominant to the left.  The PICA origins are visualized and normal bilaterally.  The basilar artery is within normal limits.  Both posterior cerebral arteries originate from basilar tip.  The PCA branch vessels are within normal limits.  IMPRESSION:  1.  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 2.  No significant proximal stenosis, aneurysm, or branch vessel occlusion.   Original Report Authenticated By: Jamesetta Orleans. MATTERN, M.D.     Medications:  I have reviewed the patient's current medications. Scheduled:   . amLODipine  5 mg Oral Daily  . aspirin  325 mg Oral Daily  . atorvastatin  20 mg Oral q1800  . darifenacin  15 mg Oral Daily  . enoxaparin  40 mg Subcutaneous Q24H  . gabapentin  800 mg Oral TID  . influenza  inactive virus vaccine  0.5 mL Intramuscular Tomorrow-1000  . insulin aspart  0-15 Units  Subcutaneous TID WC  . insulin aspart  0-5 Units Subcutaneous QHS  . insulin detemir  32 Units Subcutaneous BID  . sodium chloride  3 mL Intravenous Q12H  . Tamsulosin HCl  0.4 mg Oral Daily  . DISCONTD: insulin detemir  30 Units Subcutaneous BID    Assessment/Plan:  Patient Active Hospital Problem List: Acute left thalamic CVA (cerebral infarction) (04/10/2012)   Assessment: Work up has been unremarkable with echo and carotid showing no significant abnormalities.  Patient was on no anticoagulation at home.  Medical management indicated.     Plan: 1.  ASA to be continued for long term prophylaxis.  2.  PT to be continued on an outpatient basis  No further neurologic intervention is recommended at this time.  If further questions arise, please call or page at that time.  Thank you for allowing neurology to participate in the care of this patient.     LOS: 1 day   Thana Farr, MD [facility] [phone] 04/11/2012  6:36 PM

## 2012-04-11 NOTE — Evaluation (Signed)
Occupational Therapy Evaluation Patient Details Name: Mike Mack MRN: [id number] DOB: 05/18/1936 Today's Date: 04/11/2012 Time: 4098-1191 OT Time Calculation (min): 27 min  OT Assessment / Plan / Recommendation Clinical Impression  This 76 year old man was admitted with difficulty weight bearing.  He had polio which affects R LE.  Pt is diagnosed with new L thalamic stroke.  He will benefit from skilled OT to improve balance to reach a min A level with LB adls and transfers.      OT Assessment  Patient needs continued OT Services    Follow Up Recommendations  Inpatient Rehab    Barriers to Discharge      Equipment Recommendations  Rolling walker with 5" wheels    Recommendations for Other Services Rehab consult  Frequency  Min 3X/week    Precautions / Restrictions Precautions Precautions: Fall Precaution Comments: post polio syndrome affecting right LE Restrictions Weight Bearing Restrictions: No   Pertinent Vitals/Pain No pain    ADL  Eating/Feeding: Simulated;Independent Where Assessed - Eating/Feeding: Chair Grooming: Simulated;Minimal assistance (for balance sitting eob) Where Assessed - Grooming: Unsupported sitting Upper Body Bathing: Simulated;Minimal assistance (for balance eob) Where Assessed - Upper Body Bathing: Unsupported sitting Lower Body Bathing: Simulated;+2 Total assistance (pt 70%, 50% for sit to stand) Where Assessed - Lower Body Bathing: Supported sit to stand Upper Body Dressing: Simulated;Minimal assistance (balance at eob) Where Assessed - Upper Body Dressing: Unsupported sitting Lower Body Dressing: Performed;+2 Total assistance (sock; 70% and 50% for sit to stand) Toilet Transfer: Simulated;+2 Total assistance Toilet Transfer: Patient Percentage: 60% Toilet Transfer Method: Stand pivot (with Bil UE support) Acupuncturist:  (bed to recliner) Toileting - Clothing Manipulation and Hygiene: Simulated;+2 Total  assistance Toileting - Clothing Manipulation and Hygiene: Patient Percentage: 30% Equipment Used: Gait belt Transfers/Ambulation Related to ADLs: Pt with strong posterior lean when standing; also leaned to right.  In sitting, pt initally fell to R in unsupported sitting.  Has smaller R leg from polio ADL Comments: adls impaired by decreased balance    OT Diagnosis: Hemiplegia dominant side  OT Problem List: Decreased strength;Decreased activity tolerance;Impaired balance (sitting and/or standing);Decreased safety awareness OT Treatment Interventions: Self-care/ADL training;Therapeutic activities;Neuromuscular education;Patient/family education;Balance training   OT Goals Acute Rehab OT Goals OT Goal Formulation: With patient/family Time For Goal Achievement: 04/25/12 Potential to Achieve Goals: Good ADL Goals Pt Will Perform Grooming: with min assist;Standing at sink;Supported ADL Goal: Grooming - Progress: Goal set today Pt Will Perform Upper Body Bathing: with supervision;Sitting, edge of bed;Unsupported ADL Goal: Upper Body Bathing - Progress: Goal set today Pt Will Perform Lower Body Bathing: with min assist;Sit to stand from bed ADL Goal: Lower Body Bathing - Progress: Goal set today Pt Will Perform Upper Body Dressing: with supervision;Sitting, bed;Unsupported ADL Goal: Upper Body Dressing - Progress: Goal set today Pt Will Perform Lower Body Dressing: with min assist;Sit to stand from bed ADL Goal: Lower Body Dressing - Progress: Goal set today Pt Will Transfer to Toilet: Stand pivot transfer;with min assist;3-in-1 ADL Goal: Toilet Transfer - Progress: Goal set today Miscellaneous OT Goals Miscellaneous OT Goal #1: Pt will perform all aspects of toileting with min A OT Goal: Miscellaneous Goal #1 - Progress: Goal set today  Visit Information  Last OT Received On: 04/11/12 Assistance Needed: +2 PT/OT Co-Evaluation/Treatment: Yes    Subjective Data  Subjective: I didn't use  anything to walk with in the house (furniture walked) Patient Stated Goal: Get back to being independent   Prior Functioning  Home Living Lives With: Spouse Available Help at Discharge: Family Type of Home: House Home Access: Stairs to enter [employer] of Steps: 3 Entrance Stairs-Rails: Right Bathroom Shower/Tub: Engineer, manufacturing systems: Standard (sink on R side) Home Adaptive Equipment: Straight cane;Shower chair with back Prior Function Level of Independence: Independent with assistive device(s) (amb with cane outside, no AD inside) Able to Take Stairs?: Yes Communication Communication: No difficulties Dominant Hand: Right         Vision/Perception Praxis Praxis: will continue to assess (see cognitive section) Pt does not report any visual changes.  Will continue to assess.   Cognition  Overall Cognitive Status: Appears within functional limits for tasks assessed/performed.  ? Language involvement/ideomotor apraxia--to be assessed further.    No difficulty expressing thoughts but lifted leg when arm was requested.   Unsure if I asked him to sit at eob (verbally) or just touched bed but pt long sat.  Will continue to assess these things. Arousal/Alertness: Awake/alert Orientation Level: Appears intact for tasks assessed Behavior During Session: [facility] for tasks performed    Extremity/Trunk Assessment Right Upper Extremity Assessment RUE ROM/Strength/Tone: Within functional levels Left Upper Extremity Assessment LUE ROM/Strength/Tone: Within functional levels Right Lower Extremity Assessment RLE ROM/Strength/Tone: Deficits RLE ROM/Strength/Tone Deficits: right LE deficits due to post polio; gross muscle atrophy throughout  RLE;  RLE Sensation: WFL - Light Touch Left Lower Extremity Assessment LLE ROM/Strength/Tone: WFL for tasks assessed     Mobility Bed Mobility Bed Mobility: Supine to Sit;Sitting - Scoot to Edge of Bed Supine to Sit: 4: Min  assist Sitting - Scoot to [employer] of Bed: 4: Min assist Details for Bed Mobility Assistance: cues for safety, and task completion Transfers Sit to Stand: 1: +2 Total assist Sit to Stand: Patient Percentage: 50% Stand to Sit: 1: +2 Total assist Stand to Sit: Patient Percentage: 50% Details for Transfer Assistance: cues for safety and wt shift, pt with significant posterior bias with initail standing     Shoulder Instructions     Exercise     Balance Static Sitting Balance Static Sitting - Balance Support: Feet supported;Bilateral upper extremity supported Static Sitting - Level of Assistance: 4: Min assist (posterior LOB) Static Standing Balance Static Standing - Balance Support: During functional activity;Bilateral upper extremity supported Static Standing - Level of Assistance: 1: +2 Total assist (pt = 50%)   End of Session OT - End of Session Activity Tolerance: Patient tolerated treatment well Patient left: in chair;with call bell/phone within reach Nurse Communication: Mobility status (and no need for chair alarm)  GO Functional Assessment Tool Used: clinical judgment;observation Functional Limitation: Self care Self Care Current Status (Z6109): At least 60 percent but less than 80 percent impaired, limited or restricted Self Care Goal Status (U0454): At least 20 percent but less than 40 percent impaired, limited or restricted   Mike Mack 04/11/2012, 2:24 PM Marica Otter, OTR/L [phone] 04/11/2012

## 2012-04-11 NOTE — Evaluation (Addendum)
Speech Language Pathology Evaluation Patient Details Name: Mike Mack MRN: [id number] DOB: 1935-08-27 Today's Date: 04/11/2012 Time: 4098-1191 SLP Time Calculation (min): 30 min  Problem List:  Patient Active Problem List  Diagnosis  . Diabetes mellitus without complication  . Hypertension  . BPH (benign prostatic hyperplasia)  . Neuropathy  . Acute left thalamic CVA (cerebral infarction)  . Abnormal CT of brain   Past Medical History:  Past Medical History  Diagnosis Date  . Diabetes mellitus without complication   . Hypertension   . Small bowel obstruction   . BPH (benign prostatic hyperplasia)   . Neuropathy    Past Surgical History:  Past Surgical History  Procedure Date  . Bowel resection   . Hernia repair   . Cataract extraction w/ intraocular lens  implant, bilateral    HPI:  Pt is a 76 yo postpolio pt admitted [facility] 04/10/12 with difficulty ambulating and right sided weakness.   Pt found to have left thalamic CVA.  Pt resides with his wife who works 3 days a week as a Neurosurgeon.  OT saw pt and reommended CIR - consult made.    Assessment / Plan / Recommendation Clinical Impression  Pt currently presents with baseline cognitive linguistic skills.  Pt has an 11th grade education and was a Nurse, mental health" prior to retirement.  Language and speech were fluent and he was able to state medical diagnosis, home address and phone numbers.   He followed multiprocess commands without difficulties and participated in high level conversation without dysfluencies.  Writing ability (motor skills) impaired per pt after attempt- provided written sample to OT. Catergorical generation:  9 animals in 60 seconds.  Oriented x4.  Question mild facial asymmetry (right)- neurologist arrived to see pt therefore eval was abbreviated.   Pt did acknowledge memory and word finding difficulties prior to CVA.  SLP provided pt with verbal and written compensatory strategies for memory, word  finding and activites to improve word finding and attention skills.  No further SLP indicated as all education completed.  Thanks for  this consult.      SLP Assessment  Patient does not need any further Speech Lanaguage Pathology Services    Follow Up Recommendations    no follow up   Frequency and Duration        Pertinent Vitals/Pain Afebrile, clear   SLP Goals   none, eval educate and d/c  SLP Evaluation Prior Functioning  Education: 11th grade  Vocation: Retired (former Nurse, mental health")   Cognition  Arousal/Alertness: Awake/alert Orientation Level: Oriented X4 Attention: Sustained Sustained Attention: Appears intact Memory: Appears intact Awareness: Appears intact Problem Solving: Appears intact Safety/Judgment: Appears intact Comments: pt demonstrated awareness to changes with ability to write since CVA and expressed that his wife can write out the checks for bills if needed, pt acknowledges difficulties with memory prior to medical event     Comprehension  Auditory Comprehension Conversation: Complex Visual Recognition/Discrimination Discrimination: Within Function Limits Reading Comprehension Reading Status: Not tested    Expression Expression Primary Mode of Expression: Verbal Verbal Expression Overall Verbal Expression: Appears within functional limits for tasks assessed Initiation: No impairment Repetition: No impairment Naming: No impairment Pragmatics: No impairment Written Expression Dominant Hand: Right Written Expression: Exceptions to [facility] (defer to OT for writing) Self Formulation Ability: Sentence (syntax, semantics adequate:fine motor skills appear impaired)   Oral / Motor Oral Motor/Sensory Function Overall Oral Motor/Sensory Function: Appears within functional limits for tasks assessed Motor Speech Overall Motor Speech: Appears  within functional limits for tasks assessed Respiration: Within functional limits Phonation: Normal Resonance:  Within functional limits Articulation: Within functional limitis Intelligibility: Intelligible Motor Planning: Witnin functional limits Motor Speech Errors: Not applicable   GO     Donavan Burnet, MS [facility] SLP [phone]

## 2012-04-11 NOTE — Progress Notes (Signed)
[facility]             Progress Note   Subjective: No complaints/overnight events.  Objective: Vital signs in last 24 hours: Temp:  [97.5 F (36.4 C)-98.2 F (36.8 C)] 98.2 F (36.8 C) (10/24 1427) Pulse Rate:  [62-80] 69  (10/24 1427) Resp:  [16-18] 18  (10/24 1427) BP: (85-158)/(47-80) 130/65 mmHg (10/24 1427) SpO2:  [100 %] 100 % (10/24 1427) Weight change:  Last BM Date: 04/09/12  Intake/Output from previous day: 10/23 0701 - 10/24 0700 In: 3 [I.V.:3] Out: 1450 [Urine:1450] Total I/O In: 600 [P.O.:600] Out: 100 [Urine:100]   Physical Exam: General: Alert, awake, oriented x3, in no acute distress. HEENT: No bruits, no goiter. Heart: Regular rate and rhythm, without murmurs, rubs, gallops. Lungs: Clear to auscultation bilaterally. Abdomen: Soft, nontender, nondistended, positive bowel sounds. Extremities: No clubbing cyanosis or edema with positive pedal pulses.   Lab Results: Basic Metabolic Panel:  Basename 04/10/12 0630  NA 134*  K 4.5  CL 98  CO2 25  GLUCOSE 202*  BUN 10  CREATININE 0.99  CALCIUM 9.4  MG --  PHOS --   Liver Function Tests:  Basename 04/10/12 1249  AST 21  ALT 13  ALKPHOS 113  BILITOT 0.3  PROT 7.4  ALBUMIN 3.7   CBC:  Basename 04/10/12 0630  WBC 5.3  NEUTROABS --  HGB 12.0*  HCT 36.3*  MCV 84.8  PLT 306   CBG:  Basename 04/11/12 1140 04/11/12 0757 04/10/12 2103 04/10/12 1634 04/10/12 0630  GLUCAP 207* 89 306* 187* 188*   Hemoglobin A1C:  Basename 04/11/12 0436  HGBA1C 7.9*   Fasting Lipid Panel:  Basename 04/11/12 0436  CHOL 157  HDL 46  LDLCALC 93  TRIG 89  CHOLHDL 3.4  LDLDIRECT --    Studies/Results: Ct Head Wo Contrast  04/10/2012  *RADIOLOGY REPORT*  Clinical Data: Right leg weakness.  Right arm numbness.  CT HEAD WITHOUT CONTRAST  Technique:  Contiguous axial images were obtained from the base of the skull through the vertex without contrast.  Comparison: None.  Findings:  Moderate ventricular enlargement, more than expected for the level of  atrophy.  Third, fourth and lateral ventricles are all rounded and dilated suggestive of communicating hydrocephalus (normal pressure hydrocephalus).  Hypodensity in the cerebral white matter bilaterally may be due to subependymal resorption of CSF versus chronic microvascular ischemia.  No acute infarct.  No hemorrhage or mass lesion.  Chronic infarct cerebellum bilaterally. Calvarium intact.  IMPRESSION: Ventricular dilatation.  Findings are suggestive of normal pressure hydrocephalus.  Chronic cerebellar infarcts bilaterally.  No acute infarct.  Hypodensity in the cerebral white matter bilaterally may be subependymal resorption of CSF related to hydrocephalus versus chronic microvascular ischemia.   Original Report Authenticated By: Camelia Phenes, M.D.    Mr [facility] Wo Contrast  04/10/2012  *RADIOLOGY REPORT*  Clinical Data:  Weakness.  Sudden onset right-sided weakness and inability to walk beginning yesterday.  MRI HEAD WITHOUT AND WITH CONTRAST MRA HEAD WITHOUT CONTRAST  Technique:  Multiplanar, multiecho pulse sequences of the brain and surrounding structures were obtained without and with intravenous contrast.  Angiographic images of the head were obtained using MRA technique without contrast.  Contrast: 15mL MULTIHANCE GADOBENATE DIMEGLUMINE 529 MG/ML IV SOLN  Comparison:  CT head without contrast 04/10/2012.  MRI HEAD WITHOUT AND WITH CONTRAST  Findings:  The diffusion weighted images demonstrate an acute non hemorrhagic 10 mm infarct in the left thalamus.  Moderate ventricular dilation  is evident.  There is prominence of the lateral and third ventricles.  The fourth ventricle is prominent as well.  The configuration is typical of communicating hydrocephalus. Periventricular T2 hyperintensities are somewhat atypical for white matter change alone.  There may be an element of transependymal CSF flow.  Remote lacunar infarcts are  noted in the cerebellum bilaterally. Extensive subcortical white matter disease is present bilaterally.  Flow is present in the major intracranial arteries.  The patient is status post bilateral lens extractions.  The globes and orbits are otherwise intact.  Mild circumferential mucosal thickening is present in the maxillary sinuses bilaterally.  The paranasal sinuses are otherwise clear. There is minimal fluid in the mastoid air cells, more prominently on the left.  The postcontrast images demonstrate no pathologic enhancement.  IMPRESSION:  1.  Acute non hemorrhagic 10 mm infarct of the left thalamus. 2.  Moderate dilation of the lateral, third, and fourth ventricles with potential transependymal CSF.  This is highly suggestive of communicating hydrocephalus. 3.  Multiple remote lacunar infarcts of the cerebellum, more prominently on the left. 4.  Advanced subcortical white matter changes bilaterally.  These likely reflect the sequelae of chronic microvascular ischemia.  MRA HEAD  Findings: The left internal carotid artery is within normal limits. A prominent left posterior communicating artery is noted.  A 2.3 mm downward outpouching is present at the right posterior communicating artery.  This likely represents an infundibulum with very small vessel seen distally.  The right A1 is dominant.  The A2 segment is azygos, a normal variant.  The left A1 and into communicating artery is small.  The MCA bifurcations are within normal limits.  The MCA branch vessels are unremarkable.  The right vertebral artery is slightly dominant to the left.  The PICA origins are visualized and normal bilaterally.  The basilar artery is within normal limits.  Both posterior cerebral arteries originate from basilar tip.  The PCA branch vessels are within normal limits.  IMPRESSION:  1.  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 2.  No significant proximal stenosis, aneurysm, or branch vessel occlusion.   Original Report  Authenticated By: Jamesetta Orleans. MATTERN, M.D.    Mr Laqueta Jean MW Contrast  04/10/2012  *RADIOLOGY REPORT*  Clinical Data:  Weakness.  Sudden onset right-sided weakness and inability to walk beginning yesterday.  MRI HEAD WITHOUT AND WITH CONTRAST MRA HEAD WITHOUT CONTRAST  Technique:  Multiplanar, multiecho pulse sequences of the brain and surrounding structures were obtained without and with intravenous contrast.  Angiographic images of the head were obtained using MRA technique without contrast.  Contrast: 15mL MULTIHANCE GADOBENATE DIMEGLUMINE 529 MG/ML IV SOLN  Comparison:  CT head without contrast 04/10/2012.  MRI HEAD WITHOUT AND WITH CONTRAST  Findings:  The diffusion weighted images demonstrate an acute non hemorrhagic 10 mm infarct in the left thalamus.  Moderate ventricular dilation is evident.  There is prominence of the lateral and third ventricles.  The fourth ventricle is prominent as well.  The configuration is typical of communicating hydrocephalus. Periventricular T2 hyperintensities are somewhat atypical for white matter change alone.  There may be an element of transependymal CSF flow.  Remote lacunar infarcts are noted in the cerebellum bilaterally. Extensive subcortical white matter disease is present bilaterally.  Flow is present in the major intracranial arteries.  The patient is status post bilateral lens extractions.  The globes and orbits are otherwise intact.  Mild circumferential mucosal thickening is present in the maxillary sinuses bilaterally.  The paranasal sinuses are otherwise clear. There is minimal fluid in the mastoid air cells, more prominently on the left.  The postcontrast images demonstrate no pathologic enhancement.  IMPRESSION:  1.  Acute non hemorrhagic 10 mm infarct of the left thalamus. 2.  Moderate dilation of the lateral, third, and fourth ventricles with potential transependymal CSF.  This is highly suggestive of communicating hydrocephalus. 3.  Multiple remote  lacunar infarcts of the cerebellum, more prominently on the left. 4.  Advanced subcortical white matter changes bilaterally.  These likely reflect the sequelae of chronic microvascular ischemia.  MRA HEAD  Findings: The left internal carotid artery is within normal limits. A prominent left posterior communicating artery is noted.  A 2.3 mm downward outpouching is present at the right posterior communicating artery.  This likely represents an infundibulum with very small vessel seen distally.  The right A1 is dominant.  The A2 segment is azygos, a normal variant.  The left A1 and into communicating artery is small.  The MCA bifurcations are within normal limits.  The MCA branch vessels are unremarkable.  The right vertebral artery is slightly dominant to the left.  The PICA origins are visualized and normal bilaterally.  The basilar artery is within normal limits.  Both posterior cerebral arteries originate from basilar tip.  The PCA branch vessels are within normal limits.  IMPRESSION:  1.  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 2.  No significant proximal stenosis, aneurysm, or branch vessel occlusion.   Original Report Authenticated By: Jamesetta Orleans. MATTERN, M.D.     Medications: Scheduled Meds:   .  stroke: mapping our early stages of recovery book   Does not apply Once  . amLODipine  5 mg Oral Daily  . aspirin  325 mg Oral Daily  . darifenacin  15 mg Oral Daily  . enoxaparin  40 mg Subcutaneous Q24H  . gabapentin  800 mg Oral TID  . influenza  inactive virus vaccine  0.5 mL Intramuscular Tomorrow-1000  . insulin aspart  0-15 Units Subcutaneous TID WC  . insulin aspart  0-5 Units Subcutaneous QHS  . insulin detemir  30 Units Subcutaneous BID  . sodium chloride  3 mL Intravenous Q12H  . Tamsulosin HCl  0.4 mg Oral Daily   Continuous Infusions:  PRN Meds:.sodium chloride, acetaminophen, acetaminophen, ondansetron (ZOFRAN) IV, senna-docusate, sodium  chloride  Assessment/Plan:  Principal Problem:  *Acute left thalamic CVA (cerebral infarction) Active Problems:  Diabetes mellitus without complication  Hypertension  BPH (benign prostatic hyperplasia)  Neuropathy  Abnormal CT of brain    Acute CVA -ECHO/dopplers ok. -LDL 93 (goal is <70 given DM). -Start statin. -Start ASA for secondary stroke prophylaxis. -Aggressive risk factor modification is key. -OT recs CIR: will ask for consult.  DM -Suboptimal control. -Increase levemir to 32 units BID.  HTN -Fair control. -Will not further titrate meds given acute CVA we need to have a certain degree of permissive HTN.  Abnormal Brain CT -Seen by Dr. Thad Ranger, neurology. -No further workup for NPH given patient has no symptoms of such.  Disposition -CIR vs SNF.    Time spent coordinating care: 35 minutes.   LOS: 1 day   [facility] Pager: [phone] 04/11/2012, 2:59 PM

## 2012-04-11 NOTE — Progress Notes (Signed)
VASCULAR LAB PRELIMINARY  PRELIMINARY  PRELIMINARY  PRELIMINARY  Carotid duplex  completed.    Preliminary report:  Bilateral:  No evidence of hemodynamically significant internal carotid artery stenosis.   Vertebral artery flow is antegrade.      Francina Beery, RVT 04/11/2012, 9:43 AM

## 2012-04-12 ENCOUNTER — Inpatient Hospital Stay (HOSPITAL_COMMUNITY)
Admission: AD | Admit: 2012-04-12 | Discharge: 2012-04-20 | DRG: 945 | Disposition: A | Discharge status: Home / Self Care | Payer: Medicare Other | Source: Ambulatory Visit | Attending: Physical Medicine & Rehabilitation | Admitting: Physical Medicine & Rehabilitation

## 2012-04-12 ENCOUNTER — Encounter (HOSPITAL_COMMUNITY): Payer: Self-pay | Admitting: *Deleted

## 2012-04-12 DIAGNOSIS — E1149 Type 2 diabetes mellitus with other diabetic neurological complication: Secondary | ICD-10-CM | POA: Diagnosis present

## 2012-04-12 DIAGNOSIS — N400 Enlarged prostate without lower urinary tract symptoms: Secondary | ICD-10-CM | POA: Diagnosis present

## 2012-04-12 DIAGNOSIS — I6330 Cerebral infarction due to thrombosis of unspecified cerebral artery: Secondary | ICD-10-CM | POA: Diagnosis present

## 2012-04-12 DIAGNOSIS — G8110 Spastic hemiplegia affecting unspecified side: Secondary | ICD-10-CM

## 2012-04-12 DIAGNOSIS — I639 Cerebral infarction, unspecified: Secondary | ICD-10-CM

## 2012-04-12 DIAGNOSIS — E785 Hyperlipidemia, unspecified: Secondary | ICD-10-CM | POA: Diagnosis present

## 2012-04-12 DIAGNOSIS — Z794 Long term (current) use of insulin: Secondary | ICD-10-CM

## 2012-04-12 DIAGNOSIS — E1142 Type 2 diabetes mellitus with diabetic polyneuropathy: Secondary | ICD-10-CM | POA: Diagnosis present

## 2012-04-12 DIAGNOSIS — Z5189 Encounter for other specified aftercare: Principal | ICD-10-CM

## 2012-04-12 DIAGNOSIS — K5900 Constipation, unspecified: Secondary | ICD-10-CM | POA: Diagnosis present

## 2012-04-12 DIAGNOSIS — I10 Essential (primary) hypertension: Secondary | ICD-10-CM | POA: Diagnosis present

## 2012-04-12 LAB — GLUCOSE, CAPILLARY
Glucose-Capillary: 195 mg/dL — ABNORMAL HIGH (ref 70–99)
Glucose-Capillary: 201 mg/dL — ABNORMAL HIGH (ref 70–99)

## 2012-04-12 MED ORDER — INSULIN DETEMIR 100 UNIT/ML ~~LOC~~ SOLN
32.0000 [IU] | Freq: Two times a day (BID) | SUBCUTANEOUS | Status: DC
  Administered 2012-04-12 – 2012-04-20 (×16): 32 [IU] via SUBCUTANEOUS
  Filled 2012-04-12: qty 10

## 2012-04-12 MED ORDER — ASPIRIN 325 MG PO TABS
325.0000 mg | ORAL_TABLET | Freq: Every day | ORAL | Status: DC
  Administered 2012-04-13 – 2012-04-20 (×8): 325 mg via ORAL
  Filled 2012-04-12 (×10): qty 1

## 2012-04-12 MED ORDER — ONDANSETRON HCL 4 MG PO TABS: 4.0000 mg | ORAL_TABLET | Freq: Four times a day (QID) | ORAL | Status: DC | PRN

## 2012-04-12 MED ORDER — ENOXAPARIN SODIUM 40 MG/0.4ML ~~LOC~~ SOLN
40.0000 mg | SUBCUTANEOUS | Status: DC
  Administered 2012-04-12 – 2012-04-19 (×8): 40 mg via SUBCUTANEOUS
  Filled 2012-04-12 (×9): qty 0.4

## 2012-04-12 MED ORDER — TAMSULOSIN HCL 0.4 MG PO CAPS
0.4000 mg | ORAL_CAPSULE | Freq: Every day | ORAL | Status: DC
  Administered 2012-04-13 – 2012-04-20 (×8): 0.4 mg via ORAL
  Filled 2012-04-12 (×10): qty 1

## 2012-04-12 MED ORDER — SORBITOL 70 % SOLN
30.0000 mL | Freq: Every day | Status: DC | PRN
  Administered 2012-04-13 – 2012-04-14 (×2): 30 mL via ORAL
  Filled 2012-04-12 (×4): qty 30

## 2012-04-12 MED ORDER — ONDANSETRON HCL 4 MG/2ML IJ SOLN: 4.0000 mg | Freq: Four times a day (QID) | INTRAMUSCULAR | Status: DC | PRN

## 2012-04-12 MED ORDER — INSULIN ASPART 100 UNIT/ML ~~LOC~~ SOLN
0.0000 [IU] | Freq: Three times a day (TID) | SUBCUTANEOUS | Status: DC
  Administered 2012-04-12: 3 [IU] via SUBCUTANEOUS
  Administered 2012-04-13: 5 [IU] via SUBCUTANEOUS
  Administered 2012-04-14: 3 [IU] via SUBCUTANEOUS
  Administered 2012-04-14: 5 [IU] via SUBCUTANEOUS
  Administered 2012-04-15 (×2): 3 [IU] via SUBCUTANEOUS
  Administered 2012-04-16: 2 [IU] via SUBCUTANEOUS
  Administered 2012-04-16: 3 [IU] via SUBCUTANEOUS
  Administered 2012-04-17: 2 [IU] via SUBCUTANEOUS
  Administered 2012-04-18: 3 [IU] via SUBCUTANEOUS
  Administered 2012-04-19: 5 [IU] via SUBCUTANEOUS
  Administered 2012-04-19: 3 [IU] via SUBCUTANEOUS

## 2012-04-12 MED ORDER — AMLODIPINE BESYLATE 5 MG PO TABS
5.0000 mg | ORAL_TABLET | Freq: Every day | ORAL | Status: DC
  Administered 2012-04-13 – 2012-04-20 (×8): 5 mg via ORAL
  Filled 2012-04-12 (×10): qty 1

## 2012-04-12 MED ORDER — DARIFENACIN HYDROBROMIDE ER 15 MG PO TB24
15.0000 mg | ORAL_TABLET | Freq: Every day | ORAL | Status: DC
  Administered 2012-04-13 – 2012-04-15 (×3): 15 mg via ORAL
  Filled 2012-04-12 (×4): qty 1

## 2012-04-12 MED ORDER — ACETAMINOPHEN 325 MG PO TABS: 325.0000 mg | ORAL_TABLET | ORAL | Status: DC | PRN

## 2012-04-12 MED ORDER — GABAPENTIN 400 MG PO CAPS
800.0000 mg | ORAL_CAPSULE | Freq: Three times a day (TID) | ORAL | Status: DC
  Administered 2012-04-12 – 2012-04-20 (×23): 800 mg via ORAL
  Filled 2012-04-12 (×26): qty 2

## 2012-04-12 MED ORDER — ATORVASTATIN CALCIUM 20 MG PO TABS
20.0000 mg | ORAL_TABLET | Freq: Every day | ORAL | Status: DC
  Administered 2012-04-12 – 2012-04-19 (×8): 20 mg via ORAL
  Filled 2012-04-12 (×9): qty 1

## 2012-04-12 MED ORDER — POLYETHYLENE GLYCOL 3350 17 G PO PACK
17.0000 g | PACK | Freq: Every day | ORAL | Status: DC | PRN
  Administered 2012-04-13: 17 g via ORAL
  Filled 2012-04-12 (×2): qty 1

## 2012-04-12 NOTE — PMR Pre-admission (Signed)
PMR Admission Coordinator Pre-Admission Assessment  Patient: Mike Mack is an 76 y.o., male MRN: [id number] DOB: Mar 08, 1936 Height: 5\' 5"  (165.1 cm) Weight: 64.5 kg (142 lb 3.2 oz)  Insurance Information HMO: Yes   PPO:       PCP:       IPA:       80/20:       OTHER: Group #[id number] PRIMARY: A[employer] Medicare Complete      Policy#: [id number]      Subscriber: [name] CM Name: Pennelope Bracken      Phone#: [phone]     Fax#:   Pre-Cert#: [id number]      Employer: Retired Benefits:  Phone #: [phone]     Name: [name] Eff. Date: 06/20/11     Deduct: $0      Out of Pocket Max: $3900(met$314.29)      Life Max: unlimited CIR: $220 days 1-7               SNF:  $50 days 1-20, $150 days 21-40, $0 41-100 days Outpatient: w/medical necessity     Co-Pay: $40/visit Home Health: 100%      Co-Pay: none DME: 80%     Co-Pay: 20% Providers: in network  Emergency Contact Information Contact Information    Name Relation Home Work Mobile   Abbott Spouse [phone]       Current Medical History  Patient Admitting Diagnosis:  L Thalamic CVA  History of Present Illness:A 76 y.o. right-handed male with history of diabetes mellitus, hypertension and remote polio as a child. Patient was independent prior to admission. Admitted 04/10/2012 with right-sided weakness over a two-day period. MRI of the brain showed acute nonhemorrhagic 10 mm infarct of the left thalamus as well as multiple remote lacunar infarcts of the cerebellum more prominently on the left. Echocardiogram with ejection fraction of 70% without wall motion abnormalities. Carotid Dopplers with no ICA stenosis. MRA of the head with no significant stenosis aneurysm or occlusion. Neurology services consulted placed on aspirin therapy as well as subcutaneous Lovenox for DVT prophylaxis. Noted hemoglobin A1c of 7.9 patient on insulin as directed.   Total: 4 =NIH  Past Medical History  Past Medical History  Diagnosis Date  .  Diabetes mellitus without complication   . Hypertension   . Small bowel obstruction   . BPH (benign prostatic hyperplasia)   . Neuropathy     Family History  family history includes Diabetes in his brother and sisters; Heart attack in his sister; Heart failure in his mother; and Hypertension in his brother and sisters.  Prior Rehab/Hospitalizations:  Had outpatient therapy on [facility] 3-4 yrs ago for right leg polio.   Current Medications  Current facility-administered medications:0.9 %  sodium chloride infusion, 250 mL, Intravenous, PRN, Maryruth Bun Rama, MD;  acetaminophen (TYLENOL) suppository 650 mg, 650 mg, Rectal, Q4H PRN, Maryruth Bun Rama, MD;  acetaminophen (TYLENOL) tablet 650 mg, 650 mg, Oral, Q4H PRN, Maryruth Bun Rama, MD;  amLODipine (NORVASC) tablet 5 mg, 5 mg, Oral, Daily, Maryruth Bun Rama, MD, 5 mg at 04/12/12 1020 aspirin tablet 325 mg, 325 mg, Oral, Daily, Tobin Chad, MD, 325 mg at 04/12/12 1020;  atorvastatin (LIPITOR) tablet 20 mg, 20 mg, Oral, q1800, Henderson Cloud, MD, 20 mg at 04/11/12 1724;  darifenacin (ENABLEX) 24 hr tablet 15 mg, 15 mg, Oral, Daily, Christina P Rama, MD, 15 mg at 04/12/12 1020;  enoxaparin (LOVENOX) injection 40 mg, 40 mg, Subcutaneous, Q24H, Christina P Rama,  MD, 40 mg at 04/11/12 2121 gabapentin (NEURONTIN) capsule 800 mg, 800 mg, Oral, TID, Maryruth Bun Rama, MD, 800 mg at 04/12/12 1020;  insulin aspart (novoLOG) injection 0-15 Units, 0-15 Units, Subcutaneous, TID WC, Maryruth Bun Rama, MD, 3 Units at 04/12/12 1208;  insulin aspart (novoLOG) injection 0-5 Units, 0-5 Units, Subcutaneous, QHS, Maryruth Bun Rama, MD, 4 Units at 04/10/12 2301 insulin detemir (LEVEMIR) injection 32 Units, 32 Units, Subcutaneous, BID, Henderson Cloud, MD, 32 Units at 04/12/12 1019;  ondansetron (ZOFRAN) injection 4 mg, 4 mg, Intravenous, Q6H PRN, Christina P Rama, MD;  senna-docusate (Senokot-S) tablet 1 tablet, 1 tablet, Oral, QHS PRN, Christina P Rama,  MD;  sodium chloride 0.9 % injection 3 mL, 3 mL, Intravenous, Q12H, Christina P Rama, MD, 3 mL at 04/12/12 1020 sodium chloride 0.9 % injection 3 mL, 3 mL, Intravenous, PRN, Maryruth Bun Rama, MD;  Tamsulosin HCl (FLOMAX) capsule 0.4 mg, 0.4 mg, Oral, Daily, Maryruth Bun Rama, MD, 0.4 mg at 04/12/12 1020;  DISCONTD: insulin detemir (LEVEMIR) injection 30 Units, 30 Units, Subcutaneous, BID, Maryruth Bun Rama, MD, 30 Units at 04/11/12 1018  Patients Current Diet: Carb Control  Precautions / Restrictions Precautions Precautions: Fall Precaution Comments: post polio syndrome affecting right LE, RLE longer than LLE Restrictions Weight Bearing Restrictions: No   Prior Activity Level Community (5-7x/wk): Went out 4-5 days a week.  Home Assistive Devices / Equipment Home Assistive Devices/Equipment: Shower chair with back;Cane (specify quad or straight);Eyeglasses Home Adaptive Equipment: Straight cane;Shower chair with back  Prior Functional Level Prior Function Level of Independence: Independent with assistive device(s) (amb with cane outside, no AD inside) Able to Take Stairs?: Yes Vocation: Retired (former Nurse, mental health")  Current Functional Level Cognition  Arousal/Alertness: Awake/alert Overall Cognitive Status: Appears within functional limits for tasks assessed/performed Orientation Level: Oriented X4 Attention: Sustained Sustained Attention: Appears intact Memory: Appears intact Awareness: Appears intact Problem Solving: Appears intact Safety/Judgment: Appears intact Comments: pt demonstrated awareness to changes with ability to write since CVA and expressed that his wife can write out the checks for bills if needed, pt acknowledges difficulties with memory prior to medical event     Extremity Assessment (includes Sensation/Coordination)  RUE ROM/Strength/Tone: Within functional levels  RLE ROM/Strength/Tone: Deficits RLE ROM/Strength/Tone Deficits: right LE deficits due to  post polio; gross muscle atrophy throughout  RLE;  RLE Sensation: WFL - Light Touch    ADLs  Eating/Feeding: Simulated;Independent Where Assessed - Eating/Feeding: Chair Grooming: Performed;Wash/dry face;Teeth care;Minimal assistance (for balance in unsupported sitting.) Where Assessed - Grooming: Unsupported sitting Upper Body Bathing: Simulated;Minimal assistance (for balance eob) Where Assessed - Upper Body Bathing: Unsupported sitting Lower Body Bathing: Simulated;+2 Total assistance (pt 70%, 50% for sit to stand) Where Assessed - Lower Body Bathing: Supported sit to stand Upper Body Dressing: Simulated;Minimal assistance (balance at eob) Where Assessed - Upper Body Dressing: Unsupported sitting Lower Body Dressing: Performed;Moderate assistance;Maximal assistance Where Assessed - Lower Body Dressing: Unsupported sitting Toilet Transfer: +2 Total assistance Toilet Transfer: Patient Percentage: 70% Toilet Transfer Method: Stand pivot Toilet Transfer Equipment: Other (comment) (bed>recliner.) Toileting - Clothing Manipulation and Hygiene: Simulated;+2 Total assistance Toileting - Clothing Manipulation and Hygiene: Patient Percentage: 30% Equipment Used: Rolling walker Transfers/Ambulation Related to ADLs: Pt took a few steps once OOB with a heavy R sided lean when standing and ambulating. Pt's static sit balance was good at EOB was able to reach out to grab washcloth without LOB. Pt with poor dynamic sit balance. Max difficulty reaching feet to don/doff  socks with both an anterior and posterior LOB. Mod A needed to correct. ADL Comments: Pt appears to be having difficulty with fine motor coordinatio. Pt had max difficulty with in hand manipulation of toothbrush while brushing teeth, pt dropped x2. Pt also stated he dropped his utensils several times at breakfast. Pt had difficulty visually tracking to midline with eyes quickly shifting back to midline before object being tracked.  Convergence absent. Pt also appears to have issues with depth perception, overreaching when reaching forward to grasp a cup ,     Mobility  Bed Mobility: Supine to Sit;Sitting - Scoot to Edge of Bed Supine to Sit: 4: Min assist;HOB elevated Sitting - Scoot to [place] of Bed: 4: Min assist    Transfers  Transfers: Sit to Stand;Stand to [employer] Transfers Sit to Stand: 1: +2 Total assist Sit to Stand: Patient Percentage: 70% Stand to Sit: 1: +2 Total assist;With armrests Stand to Sit: Patient Percentage: 60% Stand Pivot Transfers: 1: +2 Total assist Stand Pivot Transfers: Patient Percentage: 60%    Ambulation / Gait / Stairs / Wheelchair Mobility  Ambulation/Gait Ambulation/Gait Assistance: 1: +2 Total assist Ambulation/Gait: Patient Percentage: 30% Ambulation Distance (Feet): 6 Feet Assistive device: Rolling walker Gait Pattern: Decreased step length - right;Decreased step length - left;Decreased weight shift to left General Gait Details: +2 for heavy right sided lean, pt may benefit from built up shoe for LLE 2* leg length discrepancy    Posture / Balance Static Sitting Balance Static Sitting - Balance Support: Bilateral upper extremity supported;Feet supported Static Sitting - Level of Assistance: 5: Stand by assistance Dynamic Sitting Balance Dynamic Sitting - Balance Support: No upper extremity supported;During functional activity Dynamic Sitting - Level of Assistance: 4: Min assist;3: Mod assist;2: Max assist (varied through session.) Reach (Patient is able to reach ___ inches to right, left, forward, back): reaching to feet to doff socks pt had anterior LOB requiring max assist to recover Static Standing Balance Static Standing - Balance Support: Bilateral upper extremity supported Static Standing - Level of Assistance: 3: Mod assist (heavy lean to Right)     Previous Home Environment Living Arrangements: Spouse/significant other Lives With: Spouse Available Help at  Discharge: Family Type of Home: House Home Access: Stairs to enter Entrance Stairs-Rails: Right Entrance Stairs-Number of Steps: 3 Bathroom Shower/Tub: Engineer, manufacturing systems: Standard (sink on R side) Home Care Services: No  Discharge Living Setting Plans for Discharge Living Setting: Patient's home;House;Lives with (comment) (Lives with wife.) Type of Home at Discharge: House Discharge Home Layout: One level Discharge Home Access: Stairs to enter Entrance Stairs-Number of Steps: 3 at back and 6 at front entry. Do you have any problems obtaining your medications?: No  Social/Family/Support Systems Patient Roles: Spouse;Parent Contact Information: Jyquan Kenley - wife (h) [phone] ([phone] Anticipated Caregiver: Wife and daughter Ability/Limitations of Caregiver: Wife works PT 3 days a week, but may take time off.  Can get daughter to stay with him while wife at work. (Wife works 6a-3:30p, Dtr to work at 4 pm.) Caregiver Availability: 24/7 Discharge Plan Discussed with Primary Caregiver: Yes Is Caregiver In Agreement with Plan?: Yes Does Caregiver/Family have Issues with Lodging/Transportation while Pt is in Rehab?: No  Goals/Additional Needs Cultural Considerations: Holiness Dietary Needs: Carb Mod Med cal thin liquids Equipment Needs: TBD Pt/Family Agrees to Admission and willing to participate: Yes Program Orientation Provided & Reviewed with Pt/Caregiver Including Roles  & Responsibilities: Yes  Patient Condition: I have discussed this case and shared  all information with Dr. Claudette Laws and have received approval for inpatient rehab admission for today, 04/12/12. Preadmission Screen Completed By:  Trish Mage, 04/12/2012 12:44 PM ______________________________________________________________________   Discussed status with Dr. Wynn Banker on 04/12/12 at 1408 and received telephone approval for admission today.  Admission Coordinator:  Trish Mage, time1408/Date10/25/13

## 2012-04-12 NOTE — Plan of Care (Signed)
Overall Plan of Care [facility]) Patient Details Name: Mike Mack MRN: [id number] DOB: [birth year]  Diagnosis:  Rehabilitation for CVA  Primary Diagnosis:    Left thalamic hemorrhage Co-morbidities: Diabetes mellitus without complication  .  Hypertension  .  Small bowel obstruction  .  BPH (benign prostatic hyperplasia)  .  Neuropathy    Functional Problem List  Patient demonstrates impairments in the following areas: Balance, Bowel, Endurance, Medication Management, Pain and Safety  Basic ADL's: eating, grooming, bathing, dressing and toileting Advanced ADL's: simple meal preparation  Transfers:  bed mobility, bed to chair, toilet, tub/shower, car, furniture and floor Locomotion:  ambulation and stairs  Additional Impairments:  Functional use of right upper extremity  Anticipated Outcomes Item Anticipated Outcome  Eating/Swallowing    Basic self-care  Modified Independent  Tolieting  Min assist  Bowel/Bladder    Transfers  Mod-I  Locomotion  Mod-I inside home with RW x 50' 3 steps with handrail Mod-I  Communication    Cognition    Pain  3 or less  Safety/Judgment  Min assist  Other     Therapy Plan: PT Frequency: 1-2 X/day, 60-90 minutes OT Frequency: 1-2 X/day, 60-90 minutes     Team Interventions: Item RN PT OT SLP SW TR Other  Self Care/Advanced ADL Retraining   x      Neuromuscular Re-Education  x x      Therapeutic Activities  x x      UE/LE Strength Training/ROM  x x      UE/LE Coordination Activities  x x      Visual/Perceptual Remediation/Compensation         DME/Adaptive Equipment Instruction  x x      Therapeutic Exercise  x x      Balance/Vestibular Training  x x      Patient/Family Education x x x      Cognitive Remediation/Compensation         Functional Mobility Training  x       Ambulation/Gait Training  x       Sports coach         Bladder Management         Bowel Management x        Disease Management/Prevention x        Pain Management x        Medication Management x        Skin Care/Wound Management         Splinting/Orthotics         Discharge Planning         Psychosocial Support                            Team Discharge Planning: Destination:  Home Projected Follow-up:  PT, Home Health and Outpatient Projected Equipment Needs:  Tub Engineer, building services Patient/family involved in discharge planning:  Yes  MD ELOS: 2 wk Medical Rehab Prognosis:  Good Assessment: 76 yo male with acute  L thalamic infarct now requiring 24/7 rehab RN,MD , CIR level PT OT

## 2012-04-12 NOTE — Progress Notes (Signed)
Rehab admissions - Evaluated for possible admission.  I spoke with patient and he is agreeable to inpatient rehab admission.  I have approval from [employer] to admit to inpatient rehab today.  I have spoken with Dr. Wynn Banker and have reviewed information with him.  Bed available and can admit to acute inpatient rehab today.  Call me for questions.  #[phone]

## 2012-04-12 NOTE — Progress Notes (Signed)
Occupational Therapy Treatment Patient Details Name: Mike Mack MRN: [id number] DOB: 08/20/1935 Today's Date: 04/12/2012 Time: 4098-1191 OT Time Calculation (min): 24 min  OT Assessment / Plan / Recommendation Comments on Treatment Session Pt tolerated well. No obvious ideomotor apraxia deficits noted. Highly recommend CIR.    Follow Up Recommendations  Inpatient Rehab    Barriers to Discharge       Equipment Recommendations  Rolling walker with 5" wheels    Recommendations for Other Services    Frequency Min 3X/week   Plan Discharge plan remains appropriate    Precautions / Restrictions Precautions Precautions: Fall Precaution Comments: post polio syndrome affecting right LE, RLE longer than LLE Restrictions Weight Bearing Restrictions: No   Pertinent Vitals/Pain Pt denied pain.    ADL  Grooming: Performed;Wash/dry face;Teeth care;Minimal assistance (for balance in unsupported sitting.) Where Assessed - Grooming: Unsupported sitting Lower Body Dressing: Performed;Moderate assistance;Maximal assistance Where Assessed - Lower Body Dressing: Unsupported sitting Toilet Transfer: +2 Total assistance Toilet Transfer: Patient Percentage: 70% Toilet Transfer Method: Stand pivot Toilet Transfer Equipment: Other (comment) (bed>recliner.) Equipment Used: Rolling walker Transfers/Ambulation Related to ADLs: Pt took a few steps once OOB with a heavy R sided lean when standing and ambulating. Pt's static sit balance was good at EOB was able to reach out to grab washcloth without LOB. Pt with poor dynamic sit balance. Max difficulty reaching feet to don/doff socks with both an anterior and posterior LOB. Max A needed to correct. ADL Comments: Pt appears to be having difficulty with fine motor coordination. Pt had max difficulty with in hand manipulation of toothbrush while brushing teeth, pt dropped x2. Pt also stated he dropped his utensils several times at breakfast. Pt had  difficulty visually tracking horizontally with eyes quickly shifting back to midline before object being tracked. Convergence absent. Pt also appears to have issues with depth perception, overreaching when reaching forward to grasp a cup.     OT Diagnosis:    OT Problem List:   OT Treatment Interventions:     OT Goals ADL Goals ADL Goal: Grooming - Progress: Progressing toward goals ADL Goal: Lower Body Dressing - Progress: Progressing toward goals ADL Goal: Toilet Transfer - Progress: Progressing toward goals  Visit Information  Last OT Received On: 04/12/12 Assistance Needed: +2 PT/OT Co-Evaluation/Treatment: Yes    Subjective Data  Subjective: I feel good today.   Prior Functioning       Cognition  Overall Cognitive Status: Appears within functional limits for tasks assessed/performed Arousal/Alertness: Awake/alert Orientation Level: Appears intact for tasks assessed Behavior During Session: [facility] for tasks performed    Mobility  Shoulder Instructions Bed Mobility Bed Mobility: Supine to Sit;Sitting - Scoot to Edge of Bed Supine to Sit: 4: Min assist;HOB elevated Sitting - Scoot to [place] of Bed: 4: Min assist Details for Bed Mobility Assistance: cues for safety, and task completion, cues for assisting RLE with LLE Transfers Sit to Stand: 1: +2 Total assist Sit to Stand: Patient Percentage: 70% Stand to Sit: 1: +2 Total assist;With armrests Stand to Sit: Patient Percentage: 60% Details for Transfer Assistance: cues for safety and wt shift, pt with significant right sided lean in standing with RW       Exercises      Balance Balance Balance Assessed: Yes Static Sitting Balance Static Sitting - Balance Support: Bilateral upper extremity supported;Feet supported Static Sitting - Level of Assistance: 5: Stand by assistance Dynamic Sitting Balance Dynamic Sitting - Balance Support: No upper extremity supported;During functional activity  Dynamic Sitting - Level of  Assistance: 4: Min assist;3: Mod assist;2: Max assist (varied through session.) Reach (Patient is able to reach ~3 inches forward): reaching to feet to doff socks pt had anterior LOB requiring max assist to recover Static Standing Balance Static Standing - Balance Support: Bilateral upper extremity supported Static Standing - Level of Assistance: 3: Mod assist (heavy lean to Right)   End of Session OT - End of Session Equipment Utilized During Treatment: Gait belt Activity Tolerance: Patient tolerated treatment well Patient left: in chair;with call bell/phone within reach  GO     Mike Mack A OTR/L [phone] 04/12/2012, 10:15 AM

## 2012-04-12 NOTE — Discharge Summary (Signed)
Physician Discharge Summary  Patient ID: Mike Mack MRN: [id number] DOB/AGE: March 01, 1936 76 y.o.  Admit date: 04/10/2012 Discharge date: 04/12/2012  Primary Care Physician:  Michiel Sites, MD   Discharge Diagnoses:    Principal Problem:  *Acute left thalamic CVA (cerebral infarction) Active Problems:  Diabetes mellitus without complication  Hypertension  BPH (benign prostatic hyperplasia)  Neuropathy  Abnormal CT of brain      Medication List     As of 04/12/2012  2:36 PM    ASK your doctor about these medications         amLODipine 5 MG tablet   Commonly known as: NORVASC   Take 5 mg by mouth daily.      gabapentin 400 MG capsule   Commonly known as: NEURONTIN   Take 800 mg by mouth 3 (three) times daily.      insulin detemir 100 UNIT/ML injection   Commonly known as: LEVEMIR   Inject 30 Units into the skin 2 (two) times daily.      metFORMIN 1000 MG tablet   Commonly known as: GLUCOPHAGE   Take 1,000 mg by mouth 2 (two) times daily with a meal.      solifenacin 5 MG tablet   Commonly known as: VESICARE   Take 10 mg by mouth daily.      Tamsulosin HCl 0.4 MG Caps   Commonly known as: FLOMAX   Take 0.4 mg by mouth.         Disposition and Follow-up:  Will be discharged to CIR today is stable condition.  Consults:  Neurology, Dr. Thad Ranger   Significant Diagnostic Studies:  CT Scan Head 10/23: IMPRESSION:  Ventricular dilatation. Findings are suggestive of normal pressure  hydrocephalus.  Chronic cerebellar infarcts bilaterally. No acute infarct.  Hypodensity in the cerebral white matter bilaterally may be  subependymal resorption of CSF related to hydrocephalus versus  chronic microvascular ischemia.  MRI Brain 10/23: IMPRESSION:  1. Acute non hemorrhagic 10 mm infarct of the left thalamus.  2. Moderate dilation of the lateral, third, and fourth ventricles  with potential transependymal CSF. This is highly suggestive of    communicating hydrocephalus.  3. Multiple remote lacunar infarcts of the cerebellum, more  prominently on the left.  4. Advanced subcortical white matter changes bilaterally. These  likely reflect the sequelae of chronic microvascular ischemia.    Brief H and P: For complete details please refer to admission H and P, but in brief patient is a 76 y.o. male with a PMH of DM and HTN as well a remote history of polio as a child, who presents with worsening right lower extremity weakness over the past 2 days. He also complains of RUE heaviness and clumsiness. He denies any facial weakness/numbness, drooling, aphasia, dysphagia, visual changes, chest pain, palpitations, dyspnea, fainting, and headaches. A CT of the head was done, which showed findings suggestive of normal pressure hydrocephalus. An MRI was subsequently performed which showed an acute left thalamic stroke and findings suggestive of communicating hydrocephalus. We were asked to admit him for further evaluation and management.     Hospital Course:  Principal Problem:  *Acute left thalamic CVA (cerebral infarction) Active Problems:  Diabetes mellitus without complication  Hypertension  BPH (benign prostatic hyperplasia)  Neuropathy  Abnormal CT of brain  Acute CVA  -ECHO/dopplers ok.  -LDL 93 (goal is <70 given DM).  -Start statin.  -Start ASA for secondary stroke prophylaxis.  -Aggressive risk factor modification is key.   DM  -  Improved CBGs on icreased levemir dose. -No further adjustments for now.  HTN  -Fair control.  -Will not further titrate meds given acute CVA we need to have a certain degree of permissive HTN.   Abnormal Brain CT  -Seen by Dr. Thad Ranger, neurology.  -No further workup for NPH given patient has no symptoms of such.    Time spent on Discharge: Greater than 30 minutes.  SignedChaya Jan [facility] Pager: [phone] 04/12/2012, 2:36 PM

## 2012-04-12 NOTE — Progress Notes (Signed)
PT EVLAUATION 04/11/12 1404  PT Visit Information  Last PT Received On 04/11/12  Assistance Needed +2 (if amb)  PT/OT Co-Evaluation/Treatment Yes  PT Time Calculation  PT Start Time 1200  PT Stop Time 1227  PT Time Calculation (min) 27 min  Subjective Data  Subjective ok  Patient Stated Goal return to I  Precautions  Precautions Fall  Precaution Comments post polio syndrome affecting right LE  Home Living  Lives With Spouse  Available Help at Discharge Family  Type of Home House  Home Access Stairs to enter  Entrance Stairs-Number of Steps 3  Entrance Stairs-Rails Right  Prior Function  Level of Independence Independent with assistive device(s) (amb with cane outside, no AD inside)  Able to Take Stairs? Yes  Communication  Communication No difficulties  Bed Mobility  Bed Mobility Supine to Sit;Sitting - Scoot to Edge of Bed  Supine to Sit 4: Min guard;4: Min assist  Sitting - Scoot to [place] of Bed 4: Min assist;4: Min guard  Details for Bed Mobility Assistance cues for safety, and task completion  Transfers  Transfers Sit to Stand;Stand to Sit;Stand Pivot Transfers  Sit to Stand 1: +2 Total assist  Sit to Stand: Patient Percentage 50%  Stand to Sit 1: +2 Total assist  Stand to Sit: Patient Percentage 50%  Stand Pivot Transfers 1: +2 Total assist  Stand Pivot Transfers: Patient Percentage 60%  Details for Transfer Assistance cues for safety and wt shift, pt with significant posterior bias with initail standing  Ambulation/Gait  Ambulation/Gait Assistance Not tested (comment)  Static Sitting Balance  Static Sitting - Balance Support Feet supported;Bilateral upper extremity supported  Static Sitting - Level of Assistance 4: Min assist (posterior LOB)  Static Standing Balance  Static Standing - Balance Support During functional activity;Bilateral upper extremity supported  Static Standing - Level of Assistance 1: +2 Total assist (pt = 50%)  PT - End of Session    Activity Tolerance Patient tolerated treatment well  Patient left in chair;with call bell/phone within reach;with family/visitor present  Nurse Communication Mobility status  PT Assessment  PT Recommendation/Assessment Patient needs continued PT services  PT Problem List Decreased strength;Decreased range of motion;Decreased balance;Decreased mobility;Decreased knowledge of use of DME  PT Therapy Diagnosis  Difficulty walking;Hemiplegia non-dominant side (non-dominate LE due to polio on right?(R hand dominate))  PT Plan  PT Frequency Min 4X/week  PT Treatment/Interventions DME instruction;Gait training;Functional mobility training;Therapeutic activities;Therapeutic exercise;Balance training;Patient/family education  PT Recommendation  Recommendations for Other Services Rehab consult  Follow Up Recommendations Post acute inpatient  Does the patient have the potential to tolerate intense rehabilitation? Yes, Recommend IP Rehab Screening  Equipment Recommended Rolling walker with 5" wheels  Individuals Consulted  Consulted and Agree with Results and Recommendations Patient  Acute Rehab PT Goals  PT Goal Formulation With patient  Time For Goal Achievement 04/25/12  Potential to Achieve Goals Good  Pt will go Supine/Side to Sit with supervision  Pt will Sit at [facility] of Bed with supervision;3-5 min  Pt will go Sit to Supine/Side with supervision  Pt will go Sit to Stand with min assist  Pt will Ambulate 1 - 15 feet;with min assist;with least restrictive assistive device  PT G-Codes **NOT FOR INPATIENT CLASS**  Functional Assessment Tool Used clinical judgement  Functional Limitation Mobility: Walking and moving around  Mobility: Walking and Moving Around Current Status (Q6578) CK  Mobility: Walking and Moving Around Goal Status (I6962) CI  PT General Charges  $$ ACUTE PT  VISIT 1 Procedure  PT Evaluation  $Initial PT Evaluation Tier I 1 Procedure  PT Treatments  $Therapeutic Activity  23-37 mins    Eval completed by Drucilla Chalet, PT.  Note just had to be pulled in by Marella Bile, PT.

## 2012-04-12 NOTE — Progress Notes (Signed)
Pt arrived to unit via [facility] at 1640. Wife at bedside. Pt was oriented to unit and rehab process with information given and safety plan sheet signed. Belongings at bedside. Call bell within reach

## 2012-04-12 NOTE — H&P (Signed)
Physical Medicine and Rehabilitation Admission H&P    No chief complaint on file. : HPI: Mike Mack is a 76 y.o. right-handed male with history of diabetes mellitus with peripheral neuropathy, hypertension and remote polio as a child. Patient was independent prior to admission. Admitted 04/10/2012 with right-sided weakness over a two-day period. MRI of the brain showed acute nonhemorrhagic 10 mm infarct of the left thalamus as well as multiple remote lacunar infarcts of the cerebellum more prominently on the left. Echocardiogram with ejection fraction of 70% without wall motion abnormalities. Carotid Dopplers with no ICA stenosis. MRA of the head with no significant stenosis aneurysm or occlusion. Neurology services consulted placed on aspirin therapy as well as subcutaneous Lovenox for DVT prophylaxis. Noted hemoglobin A1c of 7.9 patient on insulin as directed. Occupational therapy evaluation completed with physical therapy pending. Recommendations have been made for physical medicine rehabilitation consult to consider inpatient rehabilitation services. Patient was felt to be a daycare that for inpatient rehabilitation services and was admitted for comprehensive rehabilitation program   Review of Systems  Genitourinary: Positive for frequency.  Musculoskeletal: Positive for myalgias Remaining review of systems was negative  Past Medical History  Diagnosis Date  . Diabetes mellitus without complication   . Hypertension   . Small bowel obstruction   . BPH (benign prostatic hyperplasia)   . Neuropathy    Past Surgical History  Procedure Date  . Bowel resection   . Hernia repair   . Cataract extraction w/ intraocular lens  implant, bilateral    Family History  Problem Relation Age of Onset  . Diabetes Sister   . Diabetes Sister   . Diabetes Brother   . Hypertension Sister   . Hypertension Sister   . Hypertension Brother   . Heart failure Mother   . Heart attack Sister    Social  History:  reports that he has never smoked. He has never used smokeless tobacco. He reports that he does not drink alcohol or use illicit drugs. Allergies: No Known Allergies Medications Prior to Admission  Medication Sig Dispense Refill  . amLODipine (NORVASC) 5 MG tablet Take 5 mg by mouth daily.      Marland Kitchen gabapentin (NEURONTIN) 400 MG capsule Take 800 mg by mouth 3 (three) times daily.      . insulin detemir (LEVEMIR) 100 UNIT/ML injection Inject 30 Units into the skin 2 (two) times daily.      . metFORMIN (GLUCOPHAGE) 1000 MG tablet Take 1,000 mg by mouth 2 (two) times daily with a meal.      . solifenacin (VESICARE) 5 MG tablet Take 10 mg by mouth daily.      . Tamsulosin HCl (FLOMAX) 0.4 MG CAPS Take 0.4 mg by mouth.        Home:     Functional History:    Functional Status:  Mobility:          ADL:    Cognition: Cognition Orientation Level: Oriented X4     Blood pressure 155/80, pulse 77, temperature 98.2 F (36.8 C), temperature source Oral, resp. rate 18, height 5\' 5"  (1.651 m), weight 61.8 kg (136 lb 3.9 oz), SpO2 97.00%. Physical Exam  Constitutional: He is oriented to person, place, and time. He appears well-developed. Speech is fluent without evidence of aphasia. Patient follows three-step commands without difficulty HENT:  Head: Normocephalic.  Eyes:  Pupils round and reactive to light. Visual fields grossly normal. EOMs intact  Neck: Neck supple. No thyromegaly present.  Cardiovascular: Normal rate  and regular rhythm.  Pulmonary/Chest: Breath sounds normal. He has no wheezes.  Abdominal: Soft. Bowel sounds are normal. He exhibits no distension.  Musculoskeletal: He exhibits no edema. Right upper extremity 4+ out of 5 right lower extremity 1-2/5  Neurological: He is alert and oriented to person, place, and time. Pinprick and light touch intact throughout bilaterally. Mild dysmetria with finger to nose testing using right upper extremity  Follows three-step  commands  Skin: Skin is warm and dry.  Psychiatric: He has a normal mood and affect Motor is 4/5 in the right deltoid, biceps, triceps, grip 5/5 in bilateral hip flexors knee extensors ankle dorsiflexors and plantar flexors as well as the left upper extremity Cerebellar ataxia on finger nose to finger testing in the right upper extremity Heel-to-shin testing appears intact Sensation reduced to light touch in the right upper extremity  Results for orders placed during the hospital encounter of 04/12/12 (from the past 48 hour(s))  GLUCOSE, CAPILLARY     Status: Abnormal   Collection Time   04/12/12  5:11 PM      Component Value Range Comment   Glucose-Capillary 195 (*) 70 - 99 mg/dL    No results found.  Post Admission Physician Evaluation: 1. Functional deficits secondary  to left thalamic infarct. 2. Patient is admitted to receive collaborative, interdisciplinary care between the physiatrist, rehab nursing staff, and therapy team. 3. Patient's level of medical complexity and substantial therapy needs in context of that medical necessity cannot be provided at a lesser intensity of care such as a SNF. 4. Patient has experienced substantial functional loss from his/her baseline which was documented above under the "Functional History" and "Functional Status" headings.  Judging by the patient's diagnosis, physical exam, and functional history, the patient has potential for functional progress which will result in measurable gains while on inpatient rehab.  These gains will be of substantial and practical use upon discharge  in facilitating mobility and self-care at the household level. 5. Physiatrist will provide 24 hour management of medical needs as well as oversight of the therapy plan/treatment and provide guidance as appropriate regarding the interaction of the two. 6. 24 hour rehab nursing will assist with bladder management, bowel management, safety, skin/wound care, disease management,  medication administration and patient education  and help integrate therapy concepts, techniques,education, etc. 7. PT will assess and treat for:  Pre-gait training gait training and endurance safety equipment.  Goals are: supervision with mobility. 8. OT will assess and treat for: ADLs, cognitive perceptual skills, neuromuscular reeducation, safety, endurance, equipment.   Goals are: supervision for ADLs. 9. SLP will assess and treat for: assessed cognition.  Goals are: patient to direct medical care. 10. Case Management and Social Worker will assess and treat for psychological issues and discharge planning. 11. Team conference will be held weekly to assess progress toward goals and to determine barriers to discharge. 12. Patient will receive at least 3 hours of therapy per day at least 5 days per week. 13. ELOS: 10-12 day      Prognosis:  good   Medical Problem List and Plan: 1. thrombotic left thalamus infarct 2. DVT Prophylaxis/Anticoagulation: Subcutaneous Lovenox. Monitor platelet counts any signs of bleeding 3. Neuropsych: This patient is capable of making decisions on his/her own behalf. 4. Hypertension. Norvasc 5 mg daily. Monitor with increased mobility 5. Diabetes mellitus with peripheral neuropathy. Hemoglobin A1c 7.9. Levemir 32 units twice a day. Check blood sugars a.c. and at bedtime. Continue Neurontin 800 mg 3 times  a day for neuropathy 6. Hyperlipidemia. Lipitor 7. BPH. Flomax/Enablex. Check PVRs x3. Check urine study. Encourage patient to stand to avoid 04/12/2012, 7:36 PM

## 2012-04-12 NOTE — Progress Notes (Signed)
Physical Therapy Treatment Patient Details Name: Mike Mack MRN: [id number] DOB: 05/15/1936 Today's Date: 04/12/2012 Time: 0850-0908 PT Time Calculation (min): 18 min  PT Assessment / Plan / Recommendation Comments on Treatment Session  Initiated gait training today. Pt ambulated 6' with RW with +2 total assist for heavy lean to Right. Pt had difficulty weight shifting to left.  Pt notes significant decrease in ability to walk since having a stroke. CIR recommended.     Follow Up Recommendations  Post acute inpatient     Does the patient have the potential to tolerate intense rehabilitation  Yes, Recommend IP Rehab Screening  Barriers to Discharge        Equipment Recommendations  Rolling walker with 5" wheels    Recommendations for Other Services Rehab consult  Frequency Min 4X/week   Plan      Precautions / Restrictions Precautions Precautions: Fall Precaution Comments: post polio syndrome affecting right LE, RLE longer than LLE Restrictions Weight Bearing Restrictions: No   Pertinent Vitals/Pain **no pain*    Mobility  Bed Mobility Bed Mobility: Supine to Sit;Sitting - Scoot to Edge of Bed Supine to Sit: 4: Min assist;HOB elevated Sitting - Scoot to [place] of Bed: 4: Min assist Details for Bed Mobility Assistance: cues for safety, and task completion, cues for assisting RLE with LLE Transfers Transfers: Sit to Stand;Stand to [employer] Transfers Sit to Stand: 1: +2 Total assist Sit to Stand: Patient Percentage: 70% Stand to Sit: 1: +2 Total assist;With armrests Stand to Sit: Patient Percentage: 60% Details for Transfer Assistance: cues for safety and wt shift, pt with significant right sided lean in standing with RW Ambulation/Gait Ambulation/Gait Assistance: 1: +2 Total assist Ambulation/Gait: Patient Percentage: 30% Ambulation Distance (Feet): 6 Feet Assistive device: Rolling walker Gait Pattern: Decreased step length - right;Decreased step length -  left;Decreased weight shift to left General Gait Details: +2 for heavy right sided lean, pt may benefit from built up shoe for LLE 2* leg length discrepancy    Exercises     PT Diagnosis:    PT Problem List:   PT Treatment Interventions:     PT Goals Acute Rehab PT Goals PT Goal Formulation: With patient Time For Goal Achievement: 04/25/12 Potential to Achieve Goals: Good Pt will go Supine/Side to Sit: with supervision PT Goal: Supine/Side to Sit - Progress: Progressing toward goal Pt will Sit at Edge of Bed: with supervision;3-5 min PT Goal: Sit at Edge Of Bed - Progress: Progressing toward goal Pt will go Sit to Supine/Side: with supervision Pt will go Sit to Stand: with min assist PT Goal: Sit to Stand - Progress: Progressing toward goal Pt will Ambulate: 1 - 15 feet;with min assist;with least restrictive assistive device PT Goal: Ambulate - Progress: Progressing toward goal  Visit Information  Last PT Received On: 04/12/12 Assistance Needed: +2 PT/OT Co-Evaluation/Treatment: Yes    Subjective Data  Subjective: My right leg is weak just since the stroke.  Patient Stated Goal: be able to walk   Cognition  Overall Cognitive Status: Appears within functional limits for tasks assessed/performed Arousal/Alertness: Awake/alert Orientation Level: Appears intact for tasks assessed Behavior During Session: [facility] for tasks performed    Balance  Balance Balance Assessed: Yes Static Sitting Balance Static Sitting - Balance Support: Bilateral upper extremity supported;Feet supported Static Sitting - Level of Assistance: 5: Stand by assistance Dynamic Sitting Balance Reach (Patient is able to reach ___ inches to right, left, forward, back): reaching to feet to doff socks pt had  anterior LOB requiring max assist to recover Static Standing Balance Static Standing - Balance Support: Bilateral upper extremity supported Static Standing - Level of Assistance: 3: Mod assist (heavy lean  to Right)  End of Session PT - End of Session Equipment Utilized During Treatment: Gait belt Activity Tolerance: Patient tolerated treatment well Patient left: in chair;with call bell/phone within reach Nurse Communication: Mobility status   GP     Ralene Bathe Kistler 04/12/2012, 10:06 AM [phone]

## 2012-04-13 ENCOUNTER — Inpatient Hospital Stay (HOSPITAL_COMMUNITY): Payer: Medicare Other

## 2012-04-13 ENCOUNTER — Inpatient Hospital Stay (HOSPITAL_COMMUNITY): Payer: Medicare Other | Admitting: Physical Therapy

## 2012-04-13 ENCOUNTER — Inpatient Hospital Stay (HOSPITAL_COMMUNITY): Payer: Medicare Other | Admitting: Speech Pathology

## 2012-04-13 DIAGNOSIS — I6330 Cerebral infarction due to thrombosis of unspecified cerebral artery: Secondary | ICD-10-CM

## 2012-04-13 DIAGNOSIS — G8110 Spastic hemiplegia affecting unspecified side: Secondary | ICD-10-CM

## 2012-04-13 LAB — GLUCOSE, CAPILLARY
Glucose-Capillary: 118 mg/dL — ABNORMAL HIGH (ref 70–99)
Glucose-Capillary: 201 mg/dL — ABNORMAL HIGH (ref 70–99)
Glucose-Capillary: 205 mg/dL — ABNORMAL HIGH (ref 70–99)

## 2012-04-13 MED ORDER — SENNOSIDES-DOCUSATE SODIUM 8.6-50 MG PO TABS
2.0000 | ORAL_TABLET | Freq: Two times a day (BID) | ORAL | Status: DC
  Administered 2012-04-13 – 2012-04-18 (×9): 2 via ORAL
  Filled 2012-04-13 (×11): qty 2

## 2012-04-13 NOTE — Plan of Care (Signed)
Problem: RH Balance Goal: LTG: Patient will maintain dynamic sitting balance (OT) LTG: Patient will maintain dynamic sitting balance with assistance during activities of daily living (OT) LTG = STG due to short LOS Goal: LTG Patient will maintain dynamic standing with ADLs (OT) LTG: Patient will maintain dynamic standing balance with assist during activities of daily living (OT)  LTG = STG due to short LOS

## 2012-04-13 NOTE — Evaluation (Signed)
Physical Therapy Assessment and Plan  Patient Details  Name: Mike Mack MRN: [id number] Date of Birth: 1935/06/20  PT Diagnosis: Abnormality of gait, Coordination disorder, Hemiparesis dominant and Muscle weakness Rehab Potential: Good ELOS: 7-10 days   Today's Date: 04/13/2012 Time: 0930-1030 Time Calculation (min): 60 min  Problem List:  Patient Active Problem List  Diagnosis  . Diabetes mellitus without complication  . Hypertension  . BPH (benign prostatic hyperplasia)  . Neuropathy  . Acute left thalamic CVA (cerebral infarction)  . Abnormal CT of brain    Past Medical History:  Past Medical History  Diagnosis Date  . Diabetes mellitus without complication   . Hypertension   . Small bowel obstruction   . BPH (benign prostatic hyperplasia)   . Neuropathy    Past Surgical History:  Past Surgical History  Procedure Date  . Bowel resection   . Hernia repair   . Cataract extraction w/ intraocular lens  implant, bilateral     Assessment & Plan Clinical Impression: Mike Mack is a 76 y.o. right-handed male with history of diabetes mellitus with peripheral neuropathy, hypertension and remote polio as a child. Patient was independent prior to admission. Admitted 04/10/2012 with right-sided weakness over a two-day period. MRI of the brain showed acute nonhemorrhagic 10 mm infarct of the left thalamus as well as multiple remote lacunar infarcts of the cerebellum more prominently on the left. Echocardiogram with ejection fraction of 70% without wall motion abnormalities. Carotid Dopplers with no ICA stenosis. MRA of the head with no significant stenosis aneurysm or occlusion. Neurology services consulted placed on aspirin therapy as well as subcutaneous Lovenox for DVT prophylaxis. Noted hemoglobin A1c of 7.9 patient on insulin as directed. Occupational therapy evaluation completed with physical therapy pending. Recommendations have been made for physical medicine  rehabilitation consult to consider inpatient rehabilitation services.   Patient transferred to CIR on 04/12/2012 .   Patient currently requires min with mobility secondary to muscle weakness and decreased coordination.  Prior to hospitalization, patient was I/Mod-I with mobility and lived with Spouse in a House home.  Home access is 3Stairs to enter.  Patient will benefit from skilled PT intervention to maximize safe functional mobility, minimize fall risk and decrease caregiver burden for planned discharge home with intermittent assist.  Anticipate patient will benefit from follow up [facility] at discharge.   PT Evaluation Precautions/Restrictions Precautions Precautions: Fall Precaution Comments: Post polio syndrome with Right LE atrophy prior to stroke resulting in Right side weakness. Restrictions Weight Bearing Restrictions: No Pain Pain Assessment Pain Assessment: No/denies pain Home Living/Prior Functioning Home Living Lives With: Spouse Available Help at Discharge: Family Type of Home: House Home Access: Stairs to enter Secretary/administrator of Steps: 3 Entrance Stairs-Rails: Right Home Layout: One level Bathroom Shower/Tub: Engineer, manufacturing systems: Standard Bathroom Accessibility: Yes How Accessible: Accessible via walker Home Adaptive Equipment: Straight cane;Shower chair without back Prior Function Level of Independence: Independent with basic ADLs;Independent with homemaking with ambulation;Independent with gait;Other (comment) (uses single point cane when ambulating outdoors.) Able to Take Stairs?: Yes Driving: Yes 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[employer] Vision/Perception  Vision - History Baseline Vision: Wears glasses only for reading Patient Visual Report: Other (comment) (notes some diminished peripheral vision)  Cognition Overall Cognitive Status: Appears within functional limits for tasks  assessed Arousal/Alertness: Awake/alert Orientation Level: Oriented X4 Attention: Sustained Sensation Sensation Light Touch: Appears Intact (B LE's, notes tingling in Right fingers) Coordination Finger Nose Finger Test:  Right impaired Heel Shin Test: was unable to perform with Right heel prior to stroke due to polio weakness Motor  Motor Motor: Ataxia Motor - Skilled Clinical Observations: prior R LE atrophied due to polio syndrome and Right LE longer than Left but has never worn a built up shoe.  No braces on Right LE except for when child.   Mobility Bed Mobility Bed Mobility: Rolling Right;Rolling Left;Right Sidelying to Sit;Sitting - Scoot to [place] of Bed;Sit to Supine;Scooting to [facility] Rolling Right: With rail;5: Supervision Rolling Right Details: Verbal cues for technique Rolling Left: With rail;5: Supervision Rolling Left Details: Verbal cues for technique Right Sidelying to Sit: With rails;5: Supervision Right Sidelying to Sit Details: Verbal cues for technique Supine to Sit: 5: Supervision;With rails Sitting - Scoot to Edge of Bed: 5: Supervision;With rail Sitting - Scoot to [place] of Bed Details: Verbal cues for technique Scooting to [facility]: With rail;5: Supervision Scooting to [facility] Details: Verbal cues for technique Transfers Sit to Stand: 1: +1 Total assist Sit to Stand Details: Verbal cues for technique;Manual facilitation for weight shifting;Manual facilitation for placement Sit to Stand Details (indicate cue type and reason): Right hand dysmetria, handheld assist and patient reporting 2 falls in the past 6 months due to Right LE giving way due to polio weakness on R LE Stand to Sit: 1: +1 Total assist Stand to Sit Details (indicate cue type and reason): Verbal cues for technique;Manual facilitation for weight shifting;Manual facilitation for placement Stand to Sit Details: Right hand reaching back for w/c armrest but needed manual guidance/placement Stand Pivot Transfers: 1:  +1 Total assist Stand Pivot Transfer Details: Verbal cues for technique;Manual facilitation for weight shifting;Manual facilitation for placement  Trunk/Postural Assessment  Cervical Assessment Cervical Assessment: Within Functional Limits Thoracic Assessment Thoracic Assessment: Within Functional Limits Lumbar Assessment Lumbar Assessment: Within Functional Limits Postural Control Postural Control: Within Functional Limits  Balance Balance Balance Assessed: Yes Static Sitting Balance Static Sitting - Balance Support: Bilateral upper extremity supported;Feet supported Static Sitting - Level of Assistance: 5: Stand by assistance Dynamic Sitting Balance Dynamic Sitting - Balance Support: No upper extremity supported;During functional activity Dynamic Sitting - Level of Assistance: 4: Min assist Static Standing Balance Static Standing - Balance Support: Bilateral upper extremity supported Static Standing - Level of Assistance: 3: Mod assist (heavy lean to right) Extremity Assessment  RLE Assessment RLE Assessment: Exceptions to [facility] (is at Baseline with hip flex, knee ext and ankle df 2-/5 ) LLE Assessment LLE Assessment: Within Functional Limits  See FIM for current functional status Refer to Care Plan for Long Term Goals  Recommendations for other services: None  Discharge Criteria: Patient will be discharged from PT if patient refuses treatment 3 consecutive times without medical reason, if treatment goals not met, if there is a change in medical status, if patient makes no progress towards goals or if patient is discharged from hospital.  The above assessment, treatment plan, treatment alternatives and goals were discussed and mutually agreed upon: by patient and by family  Rex Kras 04/13/2012, 12:54 PM

## 2012-04-13 NOTE — Progress Notes (Signed)
Patient last bowel movement was 04-09-12. Patient given sorbitol with no effects.

## 2012-04-13 NOTE — Progress Notes (Signed)
Physical Therapy Session Note  Patient Details  Name: Mike Mack MRN: [id number] Date of Birth: Jan 15, 1936  Today's Date: 04/13/2012 Time: 4098-1191  Therapy Documentation Precautions:  Precautions Precautions: Fall Precaution Comments: Post polio syndrome with Right LE atrophy prior to stroke resulting in Right side weakness. Restrictions Weight Bearing Restrictions: No Pain: Pain Assessment Pain Assessment: No/denies pain Pain Score: 0-No pain  Therapeutic Exercise:(15') Kinetron x 12' while seated in w/c  Wheelchair Management:(15') propelling w/c in hallway with min-A. Patient tends to run too close to walls and objects on Left side needing verbal and manual cues to avoid hitting.  See FIM for current functional status  Therapy/Group: Individual Therapy  Rex Kras 04/13/2012, 10:15 PM

## 2012-04-13 NOTE — Evaluation (Signed)
Occupational Therapy Assessment and Plan  Patient Details  Name: Mike Mack MRN: [id number] Date of Birth: 11-19-1935  OT Diagnosis: abnormal posture, ataxia and muscle weakness (generalized) Rehab Potential: Rehab Potential: Good ELOS: 7-10 days   Today's Date: 04/13/2012 Time: 0930-1030 Time Calculation (min): 60 min  Problem List:  Patient Active Problem List  Diagnosis  . Diabetes mellitus without complication  . Hypertension  . BPH (benign prostatic hyperplasia)  . Neuropathy  . Acute left thalamic CVA (cerebral infarction)  . Abnormal CT of brain    Past Medical History:  Past Medical History  Diagnosis Date  . Diabetes mellitus without complication   . Hypertension   . Small bowel obstruction   . BPH (benign prostatic hyperplasia)   . Neuropathy    Past Surgical History:  Past Surgical History  Procedure Date  . Bowel resection   . Hernia repair   . Cataract extraction w/ intraocular lens  implant, bilateral     Assessment & Plan Clinical Impression: Patient is a 76 y.o. right-handed male with history of diabetes mellitus with peripheral neuropathy, hypertension and remote polio as a child. Admitted 04/10/2012 with right-sided weakness over a two-day period. MRI of the brain showed acute nonhemorrhagic 10 mm infarct of the left thalamus as well as multiple remote lacunar infarcts of the cerebellum more prominently on the left. Echocardiogram with ejection fraction of 70% without wall motion abnormalities. Carotid Dopplers with no ICA stenosis. MRA of the head with no significant stenosis aneurysm or occlusion. Neurology services consulted placed on aspirin therapy as well as subcutaneous Lovenox for DVT prophylaxis. Noted hemoglobin A1c of 7.9 patient on insulin as directed.   Patient currently requires min assistance with basic self-care skills secondary to unbalanced muscle activation and decreased sitting balance, decreased standing balance, decreased  postural control and decreased balance strategies.  Prior to hospitalization, patient could complete BADL/IADL independently.  Patient will benefit from skilled intervention to increase independence with basic self-care skills prior to discharge home with care partner.  Anticipate patient will require intermittent supervision and no further OT follow recommended.  OT - End of Session Activity Tolerance: Tolerates 10 - 20 min activity with multiple rests Endurance Deficit: Yes Endurance Deficit Description: Fatigued after 20 min of ADL OT Assessment Rehab Potential: Good Barriers to Discharge: None OT Plan OT Frequency: 1-2 X/day, 60-90 minutes Estimated Length of Stay: 7-10 days OT Treatment/Interventions: Balance/vestibular training;Discharge planning;DME/adaptive equipment instruction;Patient/family education;Self Care/advanced ADL retraining;Therapeutic Activities;Therapeutic Exercise;UE/LE Coordination activities OT Recommendation Equipment Recommended: Tub/shower bench  OT Evaluation Precautions/Restrictions  Precautions Precautions: Fall Restrictions Weight Bearing Restrictions: No  General Chart Reviewed: Yes Family/Caregiver Present: Yes  Vital Signs Therapy Vitals Temp: 97 F (36.1 C) Temp src: Oral Pulse Rate: 77  Resp: 20  BP: 158/72 mmHg Patient Position, if appropriate: Lying Oxygen Therapy SpO2: 99 % O2 Device: None (Room air)  Pain: No report of pain   Home Living/Prior Functioning Home Living Lives With: Spouse Available Help at Discharge: Family Type of Home: House Home Access: Stairs to enter Secretary/administrator of Steps: 3 Entrance Stairs-Rails: Right Home Layout: One level Bathroom Shower/Tub: Engineer, manufacturing systems: Standard Bathroom Accessibility: Yes How Accessible: Accessible via walker Home Adaptive Equipment: Straight cane;Shower chair without back IADL History Homemaking Responsibilities: Yes Meal Prep Responsibility:  Primary Laundry Responsibility: No Cleaning Responsibility: Secondary Bill Paying/Finance Responsibility: Primary Shopping Responsibility: No Child Care Responsibility: No Current License: Yes Mode of Transportation: Car Occupation: Retired 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 and  Hobbies: antique car restoration, 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 Prior Function Level of Independence: Independent with basic ADLs;Independent with homemaking with ambulation;Independent with gait;Other (comment) (uses single point cane when ambulating outdoors.) Able to Take Stairs?: Yes Driving: Yes 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[employer]  Vision/Perception  Vision - History Baseline Vision: Wears glasses all the time Visual History: Retinopathy Vision - Assessment Eye Alignment: Within Chemical engineer Perception: Within Functional Limits Praxis Praxis: Tax inspector Function: Self Monitoring Self Monitoring: Appears intact Safety/Judgment: Appears intact  Sensation Sensation Light Touch: Impaired Detail Light Touch Impaired Details: Impaired RUE Additional Comments: paresthesias, intermittent, right hand Coordination Gross Motor Movements are Fluid and Coordinated: Yes Fine Motor Movements are Fluid and Coordinated: Yes  Motor  Motor Motor: Ataxia Motor - Skilled Clinical Observations: R-UE ataxia  Trunk/Postural Assessment  Cervical Assessment Cervical Assessment: Within Functional Limits Thoracic Assessment Thoracic Assessment: Within Functional Limits Lumbar Assessment Lumbar Assessment: Within Functional Limits Postural Control Postural Control: Within Functional Limits  Balance Balance Balance Assessed: Yes Static Sitting Balance Static Sitting - Balance Support: Bilateral upper extremity supported;Feet supported Static Sitting - Level of Assistance: 5: Stand by assistance Dynamic  Sitting Balance Dynamic Sitting - Balance Support: No upper extremity supported;During functional activity Dynamic Sitting - Level of Assistance: 4: Min assist Static Standing Balance Static Standing - Balance Support: Bilateral upper extremity supported Static Standing - Level of Assistance: 3: Mod assist (heavy lean to right)  Extremity/Trunk Assessment RUE Assessment RUE Assessment: Within Functional Limits LUE Assessment LUE Assessment: Within Functional Limits  See FIM for current functional status Refer to Care Plan for Long Term Goals  Recommendations for other services: None  Discharge Criteria: Patient will be discharged from OT if patient refuses treatment 3 consecutive times without medical reason, if treatment goals not met, if there is a change in medical status, if patient makes no progress towards goals or if patient is discharged from hospital.  The above assessment, treatment plan, treatment alternatives and goals were discussed and mutually agreed upon: by patient and by family   First session: Time: 11:05 - 12:05 Time Calculation (min):  60 min  1:1 treatment provided to complete initial OT evaluation, pt/family ed on rehab goals, educate on use of DME, and perform instructional bathing and dressing activity.  Therapy/Group: Individual Therapy   Second session: Time: 1500-1530 Time Calculation (min):  30 min  Pain Assessment: No report of pain  Skilled Therapeutic Interventions: Therapeutic activities with emphasis on UE strengthening (bilateral UE using SCI-FIT ergometry), endurance, coordination activity for right upper extremity (pipe tree), and continued transfer training.  Patient reported fatigue after 5 min of UE strengthening on SCI-FIT although rating exertion as 13 (somewhat difficult) using BORG scale.  See FIM for current functional status  Therapy/Group: Individual Therapy   Georgeanne Nim 04/13/2012, 3:58 PM

## 2012-04-13 NOTE — Progress Notes (Signed)
Patient ID: Mike Mack, male   DOB: [birth year], 76 y.o.   MRN: [id number] Mike Mack is a 76 y.o. right-handed male with history of diabetes mellitus with peripheral neuropathy, hypertension and remote polio as a child. Patient was independent prior to admission. Admitted 04/10/2012 with right-sided weakness over a two-day period. MRI of the brain showed acute nonhemorrhagic 10 mm infarct of the left thalamus as well as multiple remote lacunar infarcts of the cerebellum more prominently on the left. Echocardiogram with ejection fraction of 70% without wall motion abnormalities. Carotid Dopplers with no ICA stenosis. MRA of the head with no significant stenosis aneurysm or occlusion.  Subjective/Complaints: Slept ok Review of Systems  Gastrointestinal: Positive for constipation.  Genitourinary: Positive for frequency.  All other systems reviewed and are negative.     Objective: Vital Signs: Blood pressure 144/75, pulse 68, temperature 98.3 F (36.8 C), temperature source Oral, resp. rate 18, height 5\' 5"  (1.651 m), weight 61.8 kg (136 lb 3.9 oz), SpO2 100.00%. No results found. Results for orders placed during the hospital encounter of 04/12/12 (from the past 72 hour(s))  GLUCOSE, CAPILLARY     Status: Abnormal   Collection Time   04/12/12  5:11 PM      Component Value Range Comment   Glucose-Capillary 195 (*) 70 - 99 mg/dL   GLUCOSE, CAPILLARY     Status: Abnormal   Collection Time   04/12/12  9:11 PM      Component Value Range Comment   Glucose-Capillary 201 (*) 70 - 99 mg/dL    Comment 1 Notify RN     GLUCOSE, CAPILLARY     Status: Normal   Collection Time   04/13/12  7:38 AM      Component Value Range Comment   Glucose-Capillary 97  70 - 99 mg/dL    Comment 1 Notify RN        HEENT: normal Cardio: RRR Resp: CTA B/L GI: BS positive and Distention Extremity:  No Edema Skin:   Intact Neuro: Alert/Oriented, Abnormal Sensory reduced LT on R side, Abnormal Motor 4/5 in  R Delt Bi Tri Grip, 2-/5 R HE, Ankle DF/PF o/w 0/5 RLE, Abnormal FMC Ataxic/ dec FMC and Other atrophy R thigh and R Leg Musc/Skel:  Other contracture R knee flexors and R ankle Reduce R FNF   Assessment/Plan: 1. Functional deficits secondary to L thalamic infarct which require 3+ hours per day of interdisciplinary therapy in a comprehensive inpatient rehab setting. Physiatrist is providing close team supervision and 24 hour management of active medical problems listed below. Physiatrist and rehab team continue to assess barriers to discharge/monitor patient progress toward functional and medical goals. FIM:                   Comprehension Comprehension Mode: Auditory Comprehension: 5-Understands basic 90% of the time/requires cueing < 10% of the time  Expression Expression: 5-Expresses basic 90% of the time/requires cueing < 10% of the time.  Social Interaction Social Interaction: 5-Interacts appropriately 90% of the time - Needs monitoring or encouragement for participation or interaction.  Problem Solving Problem Solving: 5-Solves basic 90% of the time/requires cueing < 10% of the time     Problem List and Plan:  1. thrombotic left thalamus infarct  2. DVT Prophylaxis/Anticoagulation: Subcutaneous Lovenox. Monitor platelet counts any signs of bleeding  3. Neuropsych: This patient is capable of making decisions on his/her own behalf.  4. Hypertension. Norvasc 5 mg daily. Monitor with increased mobility  5. Diabetes  mellitus with peripheral neuropathy. Hemoglobin A1c 7.9. Levemir 32 units twice a day. Check blood sugars a.c. and at bedtime. Continue Neurontin 800 mg 3 times a day for neuropathy  6. Hyperlipidemia. Lipitor  7. BPH. Flomax/Enablex. Check PVRs x3. Check urine study. Encourage patient to stand to avoid If PVR is >386ml will D/C enablex   LOS (Days) 1 A FACE TO FACE EVALUATION WAS PERFORMED  Coralyn Roselli E 04/13/2012, 9:34 AM

## 2012-04-14 ENCOUNTER — Inpatient Hospital Stay (HOSPITAL_COMMUNITY): Payer: Medicare Other | Admitting: *Deleted

## 2012-04-14 LAB — GLUCOSE, CAPILLARY: Glucose-Capillary: 226 mg/dL — ABNORMAL HIGH (ref 70–99)

## 2012-04-14 MED ORDER — BISACODYL 10 MG RE SUPP
10.0000 mg | Freq: Every day | RECTAL | Status: DC | PRN
  Administered 2012-04-14: 10 mg via RECTAL
  Filled 2012-04-14 (×2): qty 1

## 2012-04-14 NOTE — Progress Notes (Signed)
Patient ID: Mike Mack, male   DOB: 05-27-1936, 76 y.o.   MRN: [id number] Mike Mack is a 76 y.o. right-handed male with history of diabetes mellitus with peripheral neuropathy, hypertension and remote polio as a child. Patient was independent prior to admission. Admitted 04/10/2012 with right-sided weakness over a two-day period. MRI of the brain showed acute nonhemorrhagic 10 mm infarct of the left thalamus as well as multiple remote lacunar infarcts of the cerebellum more prominently on the left. Echocardiogram with ejection fraction of 70% without wall motion abnormalities. Carotid Dopplers with no ICA stenosis. MRA of the head with no significant stenosis aneurysm or occlusion.  Subjective/Complaints: No PVRs checked No BM since 10/23 Review of Systems  Gastrointestinal: Positive for constipation.  Genitourinary: Positive for frequency.  All other systems reviewed and are negative.     Objective: Vital Signs: Blood pressure 132/81, pulse 66, temperature 98 F (36.7 C), temperature source Oral, resp. rate 18, height 5\' 5"  (1.651 m), weight 61.8 kg (136 lb 3.9 oz), SpO2 99.00%. No results found. Results for orders placed during the hospital encounter of 04/12/12 (from the past 72 hour(s))  GLUCOSE, CAPILLARY     Status: Abnormal   Collection Time   04/12/12  5:11 PM      Component Value Range Comment   Glucose-Capillary 195 (*) 70 - 99 mg/dL   GLUCOSE, CAPILLARY     Status: Abnormal   Collection Time   04/12/12  9:11 PM      Component Value Range Comment   Glucose-Capillary 201 (*) 70 - 99 mg/dL    Comment 1 Notify RN     GLUCOSE, CAPILLARY     Status: Normal   Collection Time   04/13/12  7:38 AM      Component Value Range Comment   Glucose-Capillary 97  70 - 99 mg/dL    Comment 1 Notify RN     GLUCOSE, CAPILLARY     Status: Abnormal   Collection Time   04/13/12 12:00 PM      Component Value Range Comment   Glucose-Capillary 118 (*) 70 - 99 mg/dL    Comment 1  Notify RN     GLUCOSE, CAPILLARY     Status: Abnormal   Collection Time   04/13/12  4:31 PM      Component Value Range Comment   Glucose-Capillary 201 (*) 70 - 99 mg/dL   GLUCOSE, CAPILLARY     Status: Abnormal   Collection Time   04/13/12  8:23 PM      Component Value Range Comment   Glucose-Capillary 205 (*) 70 - 99 mg/dL    Comment 1 Notify RN     GLUCOSE, CAPILLARY     Status: Normal   Collection Time   04/14/12  7:44 AM      Component Value Range Comment   Glucose-Capillary 80  70 - 99 mg/dL      HEENT: normal Cardio: RRR Resp: CTA B/L GI: BS positive and Distention Extremity:  No Edema Skin:   Intact Neuro: Alert/Oriented, Abnormal Sensory reduced LT on R side, Abnormal Motor 4/5 in R Delt Bi Tri Grip, 2-/5 R HE, Ankle DF/PF o/w 0/5 RLE, Abnormal FMC Ataxic/ dec FMC and Other atrophy R thigh and R Leg Musc/Skel:  Other contracture R knee flexors and R ankle Reduce R FNF   Assessment/Plan: 1. Functional deficits secondary to L thalamic infarct which require 3+ hours per day of interdisciplinary therapy in a comprehensive inpatient rehab setting. Physiatrist is  providing close team supervision and 24 hour management of active medical problems listed below. Physiatrist and rehab team continue to assess barriers to discharge/monitor patient progress toward functional and medical goals. FIM: FIM - Bathing Bathing Steps Patient Completed: Chest;Right Arm;Left Arm;Abdomen;Front perineal area;Buttocks;Right upper leg;Left upper leg;Right lower leg (including foot);Left lower leg (including foot) Bathing: 5: Supervision: Safety issues/verbal cues  FIM - Upper Body Dressing/Undressing Upper body dressing/undressing steps patient completed: Thread/unthread right sleeve of pullover shirt/dresss;Thread/unthread left sleeve of pullover shirt/dress;Put head through opening of pull over shirt/dress;Pull shirt over trunk Upper body dressing/undressing: 5: Set-up assist to: Obtain  clothing/put away FIM - Lower Body Dressing/Undressing Lower body dressing/undressing steps patient completed: Thread/unthread right underwear leg;Thread/unthread left underwear leg;Pull underwear up/down;Thread/unthread right pants leg;Thread/unthread left pants leg;Pull pants up/down;Fasten/unfasten pants;Don/Doff right sock;Don/Doff left sock;Don/Doff right shoe;Don/Doff left shoe;Fasten/unfasten right shoe;Fasten/unfasten left shoe Lower body dressing/undressing: 4: Min-Patient completed 75 plus % of tasks  FIM - Hotel manager Devices: Grab bar or rail for support Toileting: 5: Supervision: Safety issues/verbal cues  FIM - Diplomatic Services operational officer Devices: Best boy Transfers: 4-To toilet/BSC: Min A (steadying Pt. > 75%);5-From toilet/BSC: Supervision (verbal cues/safety issues)  FIM - Bed/Chair Transfer Bed/Chair Transfer Assistive Devices: Bed rails;Walker Bed/Chair Transfer: 6: Supine > Sit: No assist;6: Sit > Supine: No assist;5: Bed > Chair or W/C: Supervision (verbal cues/safety issues);5: Chair or W/C > Bed: Supervision (verbal cues/safety issues)  FIM - Locomotion: Wheelchair Locomotion: Wheelchair: 2: Travels 50 - 149 ft with minimal assistance (Pt.>75%) FIM - Locomotion: Ambulation Locomotion: Ambulation Assistive Devices: Designer, industrial/product Ambulation/Gait Assistance: 1: +1 Total assist Locomotion: Ambulation: 1: Travels less than 50 ft with total assistance/helper does all (Pt.<25%)  Comprehension Comprehension Mode: Auditory Comprehension: 5-Follows basic conversation/direction: With no assist  Expression Expression Mode: Verbal Expression: 7-Expresses complex ideas: With no assist  Social Interaction Social Interaction: 7-Interacts appropriately with others - No medications needed.  Problem Solving Problem Solving: 5-Solves basic 90% of the time/requires cueing < 10% of the time  Memory Memory: 5-Requires cues to  use assistive device  Problem List and Plan:  1. thrombotic left thalamus infarct  2. DVT Prophylaxis/Anticoagulation: Subcutaneous Lovenox. Monitor platelet counts any signs of bleeding  3. Neuropsych: This patient is capable of making decisions on his/her own behalf.  4. Hypertension. Norvasc 5 mg daily. Monitor with increased mobility  5. Diabetes mellitus with peripheral neuropathy. Hemoglobin A1c 7.9. Levemir 32 units twice a day. Check blood sugars a.c. and at bedtime. Continue Neurontin 800 mg 3 times a day for neuropathy  6. Hyperlipidemia. Lipitor  7. BPH. Flomax/Enablex. Check PVRs x3. Check urine study. Encourage patient to stand to avoid If PVR is >347ml will D/C enablex   LOS (Days) 2 A FACE TO FACE EVALUATION WAS PERFORMED  KIRSTEINS,ANDREW E 04/14/2012, 8:23 AM

## 2012-04-14 NOTE — Progress Notes (Signed)
Occupational Therapy Note  Patient Details  Name: Cochise Dinneen MRN: [id number] Date of Birth: 1935-10-12 Today's Date: 04/14/2012  Time:  1645-1730  (45 min) Pain:  None Individual session  Engaged in neuromuscular re education, balance, trunk control,  Core strength.  Did work on EOM with lateral weight shifts and reaching out 4 inches on left and 6 inches on right.  Pt. Had LOB 3-4 times initially on left but improved as session progressed.  Instructed pt on weight shifting forward on to thighs to reach forward.  Initially he was in posterior pelvic tilt but with manual facilitation and verbal cues was able to shift forward.  Practiced half squats with hands on bench.  Right LE tends to abduct and externally rotate with activities.  Pt reports it has always done that.  Provided right knee stabilization with stepping with walker and pt did better.  Transferred to wc with mod assist.  Pt tends to over use arms and trunk flexion for control.    Humberto Seals 04/14/2012, 5:42 PM

## 2012-04-15 ENCOUNTER — Inpatient Hospital Stay (HOSPITAL_COMMUNITY): Payer: Medicare Other | Admitting: Physical Therapy

## 2012-04-15 ENCOUNTER — Encounter (HOSPITAL_COMMUNITY): Payer: Medicare Other | Admitting: Occupational Therapy

## 2012-04-15 ENCOUNTER — Inpatient Hospital Stay (HOSPITAL_COMMUNITY): Payer: Medicare Other | Admitting: Occupational Therapy

## 2012-04-15 DIAGNOSIS — I6330 Cerebral infarction due to thrombosis of unspecified cerebral artery: Secondary | ICD-10-CM

## 2012-04-15 DIAGNOSIS — G8110 Spastic hemiplegia affecting unspecified side: Secondary | ICD-10-CM

## 2012-04-15 DIAGNOSIS — I639 Cerebral infarction, unspecified: Secondary | ICD-10-CM

## 2012-04-15 LAB — COMPREHENSIVE METABOLIC PANEL
ALT: 273 U/L — ABNORMAL HIGH (ref 0–53)
AST: 208 U/L — ABNORMAL HIGH (ref 0–37)
Albumin: 3.2 g/dL — ABNORMAL LOW (ref 3.5–5.2)
Calcium: 8.8 mg/dL (ref 8.4–10.5)
Creatinine, Ser: 1.16 mg/dL (ref 0.50–1.35)
GFR calc non Af Amer: 61 mL/min — ABNORMAL LOW (ref >90)
Sodium: 138 mEq/L (ref 135–145)
Total Protein: 6.7 g/dL (ref 6.0–8.3)

## 2012-04-15 LAB — CBC WITH DIFFERENTIAL/PLATELET
Basophils Absolute: 0 10*3/uL (ref 0.0–0.1)
Basophils Relative: 0 % (ref 0–1)
Eosinophils Absolute: 0 10*3/uL (ref 0.0–0.7)
Eosinophils Relative: 0 % (ref 0–5)
HCT: 33.5 % — ABNORMAL LOW (ref 39.0–52.0)
MCHC: 32.8 g/dL (ref 30.0–36.0)
MCV: 84.8 fL (ref 78.0–100.0)
Monocytes Absolute: 0.6 10*3/uL (ref 0.1–1.0)
Platelets: 266 10*3/uL (ref 150–400)
RDW: 13.7 % (ref 11.5–15.5)
WBC: 9.9 10*3/uL (ref 4.0–10.5)

## 2012-04-15 LAB — GLUCOSE, CAPILLARY
Glucose-Capillary: 106 mg/dL — ABNORMAL HIGH (ref 70–99)
Glucose-Capillary: 199 mg/dL — ABNORMAL HIGH (ref 70–99)
Glucose-Capillary: 394 mg/dL — ABNORMAL HIGH (ref 70–99)

## 2012-04-15 MED ORDER — METFORMIN HCL 500 MG PO TABS
500.0000 mg | ORAL_TABLET | Freq: Every day | ORAL | Status: DC
  Administered 2012-04-16: 500 mg via ORAL
  Filled 2012-04-15 (×3): qty 1

## 2012-04-15 NOTE — Progress Notes (Signed)
Patient information reviewed and entered into eRehab system by Lanah Steines, RN, CRRN, PPS Coordinator.  Information including medical coding and functional independence measure will be reviewed and updated through discharge.     Per nursing patient was given "Data Collection Information Summary for Patients in Inpatient Rehabilitation Facilities with attached "Privacy Act Statement-Health Care Records" upon admission.  

## 2012-04-15 NOTE — Progress Notes (Signed)
Occupational Therapy Session Note  Patient Details  Name: Mike Mack MRN: [id number] Date of Birth: Sep 13, 1935  Today's Date: 04/15/2012 Time:  0930-1030-     Short Term Goals: Week 1:  OT Short Term Goal 1 (Week 1): Patient will complete functional transfers with modified independence OT Short Term Goal 2 (Week 1): Patient will demonstrate ability to complete home exercise program to improve right upper extremity motor control with supervision OT Short Term Goal 3 (Week 1): Patient will complete dressing, sitting and standing, without loss of balance, independently modified with use of AE and DME, prn OT Short Term Goal 4 (Week 1): Patient will complete 60 minutes of therapeutic activity to improve endurance with 1 rest break.  Skilled Therapeutic Interventions/Progress Updates:    Self care retraining with bathing at shower level, dressing and grooming tasks focusing on dynamic trunk stability, functional mobility for ADL tasks, sit<>stands and dynamic balance during functional activity. Performed EOB to w/c transfer with RW; stand pivot transfer using grab bars from w/c to shower chair for bathing. Pt performed sit<>stands for buttocks/periarea cleansing with grab bar for unilateral UE support. Performed sit<>stands for clothing management with RW and Min instructional cues to use RW for unilateral support to maintain standing balance. Demonstrated right sided trunk weakness with LOB to right when sitting EOB and donning shoes. Inserted  foot riser in L shoe for leg length discrepancy to improve alignment of pelvis and standing balance. Ambulated ~20 feet with RW; pt agreed foot riser helped with balance and comfort.  Therapy Documentation Precautions:  Precautions Precautions: Fall Precaution Comments: Post polio syndrome with Right LE atrophy prior to stroke resulting in Right side weakness. Restrictions Weight Bearing Restrictions: No Pain:  no c/o pain See FIM for current  functional status  Therapy/Group: Individual Therapy  Jackelyn Poling 04/15/2012, 12:10 PM

## 2012-04-15 NOTE — Progress Notes (Signed)
Physical Therapy Note  Patient Details  Name: Mike Mack MRN: [id number] Date of Birth: 10/27/1935 Today's Date: 04/15/2012  Time 1: 1300-1345 45 minutes  1:1 No c/o pain.  W/c mobility with supervision, distance limited by fatigue to < 100'.  Gait training controlled environment with min A.  Pt begins to drag R LE after 15-20' due to fatigue, requires cues and manual facilitation to lift LE for clearance.   Obstacle negotiation with RW with min A, cuing for technique.  Pt with good safety awareness, needs min A for lifting RW to clear obstacles.  Stair training x 10 stairs with close supervision-min A with B rails.  Pt performed session with 1.5'' lift in L shoe to decrease leg length discrepancy.  Pt reports improved balance and comfort while using lift.   Time 2: 1630-1700 30 minutes  No c/o pain.  Seated balance training with no UE/LE support with wt shifts with supervision, reaching out of BOS requires min-mod A to correct LOB.  Abdominal strengthening exercises with focus on abs and obliques with functional movements.  Sit to stands without UEs with manual facilitation to increase wt bearing on R LE.  Pt tends to hyperextend R LE for increased control.  Short distance gait with RW with focus on R knee control, pt unable to control hyperextension of R LE without manual facilitation.  Individual therapy  Jaye Saal 04/15/2012, 2:13 PM

## 2012-04-15 NOTE — Progress Notes (Signed)
Leg discrepancy was equal to 1 1/2 inch.   This note has been reviewed and this clinician agrees with information provided.

## 2012-04-15 NOTE — Progress Notes (Signed)
Patient ID: Mike Mack, male   DOB: 1936/02/22, 76 y.o.   MRN: [id number] Mike Mack is a 76 y.o. right-handed male with history of diabetes mellitus with peripheral neuropathy, hypertension and remote polio as a child. Patient was independent prior to admission. Admitted 04/10/2012 with right-sided weakness over a two-day period. MRI of the brain showed acute nonhemorrhagic 10 mm infarct of the left thalamus as well as multiple remote lacunar infarcts of the cerebellum more prominently on the left. Echocardiogram with ejection fraction of 70% without wall motion abnormalities. Carotid Dopplers with no ICA stenosis. MRA of the head with no significant stenosis aneurysm or occlusion.  Subjective/Complaints: No PVRs checked No BM since 10/23 Review of Systems  Gastrointestinal: Positive for constipation.  Genitourinary: Positive for frequency.  All other systems reviewed and are negative.     Objective: Vital Signs: Blood pressure 110/69, pulse 71, temperature 98.4 F (36.9 C), temperature source Oral, resp. rate 18, height 5\' 5"  (1.651 m), weight 61.8 kg (136 lb 3.9 oz), SpO2 96.00%. No results found. Results for orders placed during the hospital encounter of 04/12/12 (from the past 72 hour(s))  GLUCOSE, CAPILLARY     Status: Abnormal   Collection Time   04/12/12  5:11 PM      Component Value Range Comment   Glucose-Capillary 195 (*) 70 - 99 mg/dL   GLUCOSE, CAPILLARY     Status: Abnormal   Collection Time   04/12/12  9:11 PM      Component Value Range Comment   Glucose-Capillary 201 (*) 70 - 99 mg/dL    Comment 1 Notify RN     GLUCOSE, CAPILLARY     Status: Normal   Collection Time   04/13/12  7:38 AM      Component Value Range Comment   Glucose-Capillary 97  70 - 99 mg/dL    Comment 1 Notify RN     GLUCOSE, CAPILLARY     Status: Abnormal   Collection Time   04/13/12 12:00 PM      Component Value Range Comment   Glucose-Capillary 118 (*) 70 - 99 mg/dL    Comment 1  Notify RN     GLUCOSE, CAPILLARY     Status: Abnormal   Collection Time   04/13/12  4:31 PM      Component Value Range Comment   Glucose-Capillary 201 (*) 70 - 99 mg/dL   GLUCOSE, CAPILLARY     Status: Abnormal   Collection Time   04/13/12  8:23 PM      Component Value Range Comment   Glucose-Capillary 205 (*) 70 - 99 mg/dL    Comment 1 Notify RN     GLUCOSE, CAPILLARY     Status: Normal   Collection Time   04/14/12  7:44 AM      Component Value Range Comment   Glucose-Capillary 80  70 - 99 mg/dL   GLUCOSE, CAPILLARY     Status: Abnormal   Collection Time   04/14/12 11:44 AM      Component Value Range Comment   Glucose-Capillary 156 (*) 70 - 99 mg/dL   GLUCOSE, CAPILLARY     Status: Abnormal   Collection Time   04/14/12  4:50 PM      Component Value Range Comment   Glucose-Capillary 216 (*) 70 - 99 mg/dL   GLUCOSE, CAPILLARY     Status: Abnormal   Collection Time   04/14/12  9:10 PM      Component Value Range Comment  Glucose-Capillary 226 (*) 70 - 99 mg/dL   GLUCOSE, CAPILLARY     Status: Abnormal   Collection Time   04/15/12  7:25 AM      Component Value Range Comment   Glucose-Capillary 106 (*) 70 - 99 mg/dL    Comment 1 Documented in Chart        HEENT: normal Cardio: RRR Resp: CTA B/L GI: BS positive and Distention Extremity:  No Edema Skin:   Intact Neuro: Alert/Oriented, Abnormal Sensory reduced LT on R side, Abnormal Motor 4/5 in R Delt Bi Tri Grip, 2-/5 R HE, Ankle DF/PF o/w 0/5 RLE, Abnormal FMC Ataxic/ dec FMC and Other atrophy R thigh and R Leg Musc/Skel:  Other contracture R knee flexors and R ankle Reduce R FNF   Assessment/Plan: 1. Functional deficits secondary to L thalamic infarct which require 3+ hours per day of interdisciplinary therapy in a comprehensive inpatient rehab setting. Physiatrist is providing close team supervision and 24 hour management of active medical problems listed below. Physiatrist and rehab team continue to assess  barriers to discharge/monitor patient progress toward functional and medical goals. FIM: FIM - Bathing Bathing Steps Patient Completed: Chest;Right Arm;Left Arm;Abdomen;Front perineal area;Buttocks;Right upper leg;Left upper leg;Right lower leg (including foot);Left lower leg (including foot) Bathing: 5: Supervision: Safety issues/verbal cues  FIM - Upper Body Dressing/Undressing Upper body dressing/undressing steps patient completed: Thread/unthread right sleeve of pullover shirt/dresss;Thread/unthread left sleeve of pullover shirt/dress;Put head through opening of pull over shirt/dress;Pull shirt over trunk Upper body dressing/undressing: 5: Set-up assist to: Obtain clothing/put away FIM - Lower Body Dressing/Undressing Lower body dressing/undressing steps patient completed: Thread/unthread right underwear leg;Thread/unthread left underwear leg;Pull underwear up/down;Thread/unthread right pants leg;Thread/unthread left pants leg;Pull pants up/down;Fasten/unfasten pants;Don/Doff right sock;Don/Doff left sock;Don/Doff right shoe;Don/Doff left shoe;Fasten/unfasten right shoe;Fasten/unfasten left shoe Lower body dressing/undressing: 4: Min-Patient completed 75 plus % of tasks  FIM - Hotel manager Devices: Grab bar or rail for support Toileting: 5: Supervision: Safety issues/verbal cues  FIM - Diplomatic Services operational officer Devices: Best boy Transfers: 4-To toilet/BSC: Min A (steadying Pt. > 75%);5-From toilet/BSC: Supervision (verbal cues/safety issues)  FIM - Bed/Chair Transfer Bed/Chair Transfer Assistive Devices: Bed rails;Walker Bed/Chair Transfer: 6: Supine > Sit: No assist;6: Sit > Supine: No assist;5: Bed > Chair or W/C: Supervision (verbal cues/safety issues);5: Chair or W/C > Bed: Supervision (verbal cues/safety issues)  FIM - Locomotion: Wheelchair Locomotion: Wheelchair: 2: Travels 50 - 149 ft with minimal assistance (Pt.>75%) FIM -  Locomotion: Ambulation Locomotion: Ambulation Assistive Devices: Designer, industrial/product Ambulation/Gait Assistance: 1: +1 Total assist Locomotion: Ambulation: 1: Travels less than 50 ft with total assistance/helper does all (Pt.<25%)  Comprehension Comprehension Mode: Auditory Comprehension: 5-Follows basic conversation/direction: With no assist  Expression Expression Mode: Verbal Expression: 7-Expresses complex ideas: With no assist  Social Interaction Social Interaction: 7-Interacts appropriately with others - No medications needed.  Problem Solving Problem Solving: 5-Solves basic 90% of the time/requires cueing < 10% of the time  Memory Memory: 5-Requires cues to use assistive device  Problem List and Plan:  1. thrombotic left thalamus infarct , PPS 2. DVT Prophylaxis/Anticoagulation: Subcutaneous Lovenox. Monitor platelet counts any signs of bleeding  3. Neuropsych: This patient is capable of making decisions on his/her own behalf.  4. Hypertension. Norvasc 5 mg daily. Monitor with increased mobility  5. Diabetes mellitus with peripheral neuropathy. Hemoglobin A1c 7.9. Levemir 32 units twice a day. Check blood sugars a.c. and at bedtime. Continue Neurontin 800 mg 3 times a day for neuropathy   -  resume part of home metformin dose 500mg  qday (1000 bid at home) 6. Hyperlipidemia. Lipitor  7. BPH. Flomax/Enablex. Check PVRs x3. Check urine study. Encourage patient to stand to void    LOS (Days) 3 A FACE TO FACE EVALUATION WAS PERFORMED  Kimmberly Wisser T 04/15/2012, 8:30 AM

## 2012-04-15 NOTE — Progress Notes (Signed)
Occupational Therapy Session Note  Patient Details  Name: Mike Mack MRN: [id number] Date of Birth: Oct 01, 1935  Today's Date: 04/15/2012 Time: 1105-1200 Time Calculation (min): 55 min  Short Term Goals: Week 1:  OT Short Term Goal 1 (Week 1): Patient will complete functional transfers with modified independence OT Short Term Goal 2 (Week 1): Patient will demonstrate ability to complete home exercise program to improve right upper extremity motor control with supervision OT Short Term Goal 3 (Week 1): Patient will complete dressing, sitting and standing, without loss of balance, independently modified with use of AE and DME, prn OT Short Term Goal 4 (Week 1): Patient will complete 60 minutes of therapeutic activity to improve endurance with 1 rest break.  Skilled Therapeutic Interventions/Progress Updates:  Patient seated in w/c upon arrival. Engaged in toileting, propel w/c to therapy bathroom and practiced tub-shower transfer with grab bars and shower seat, and RUE coordination activities..  Focus session on activity tolerance, stand with RW over commode to urinate with close supervision - Min assist, RUE gross and fine motor tasks while standing and while seated, sitting and standing balance (LOB to right 3 times while engaged standing task and required mod-max assist to recover), safe tub transfers to include dry simulation using same method/technique he used at home.  Therapy Documentation Precautions:  Precautions Precautions: Fall Precaution Comments: Post polio syndrome with Right LE atrophy prior to stroke resulting in Right side weakness. Restrictions Weight Bearing Restrictions: No Pain: Denies pain  See FIM for current functional status  Therapy/Group: Individual Therapy  Taron Mondor 04/15/2012, 3:11 PM

## 2012-04-16 ENCOUNTER — Inpatient Hospital Stay (HOSPITAL_COMMUNITY): Payer: Medicare Other | Admitting: Physical Therapy

## 2012-04-16 ENCOUNTER — Encounter (HOSPITAL_COMMUNITY): Payer: Medicare Other

## 2012-04-16 ENCOUNTER — Encounter (HOSPITAL_COMMUNITY): Payer: Medicare Other | Admitting: Occupational Therapy

## 2012-04-16 DIAGNOSIS — G8110 Spastic hemiplegia affecting unspecified side: Secondary | ICD-10-CM

## 2012-04-16 DIAGNOSIS — I6330 Cerebral infarction due to thrombosis of unspecified cerebral artery: Secondary | ICD-10-CM

## 2012-04-16 LAB — HEPATIC FUNCTION PANEL
Bilirubin, Direct: <0.1 mg/dL (ref 0.0–0.3)
Total Bilirubin: 0.2 mg/dL — ABNORMAL LOW (ref 0.3–1.2)

## 2012-04-16 LAB — GLUCOSE, CAPILLARY: Glucose-Capillary: 183 mg/dL — ABNORMAL HIGH (ref 70–99)

## 2012-04-16 NOTE — Progress Notes (Signed)
Patient ID: Mike Mack, male   DOB: 1935-12-18, 76 y.o.   MRN: [id number]   Subjective/Complaints: No new complaints  Review of Systems  Gastrointestinal: Positive for constipation.  Genitourinary: Positive for frequency.  All other systems reviewed and are negative.     Objective: Vital Signs: Blood pressure 113/67, pulse 66, temperature 97.9 F (36.6 C), temperature source Oral, resp. rate 17, height 5\' 5"  (1.651 m), weight 61.8 kg (136 lb 3.9 oz), SpO2 99.00%. No results found. Results for orders placed during the hospital encounter of 04/12/12 (from the past 72 hour(s))  GLUCOSE, CAPILLARY     Status: Abnormal   Collection Time   04/13/12 12:00 PM      Component Value Range Comment   Glucose-Capillary 118 (*) 70 - 99 mg/dL    Comment 1 Notify RN     GLUCOSE, CAPILLARY     Status: Abnormal   Collection Time   04/13/12  4:31 PM      Component Value Range Comment   Glucose-Capillary 201 (*) 70 - 99 mg/dL   GLUCOSE, CAPILLARY     Status: Abnormal   Collection Time   04/13/12  8:23 PM      Component Value Range Comment   Glucose-Capillary 205 (*) 70 - 99 mg/dL    Comment 1 Notify RN     GLUCOSE, CAPILLARY     Status: Normal   Collection Time   04/14/12  7:44 AM      Component Value Range Comment   Glucose-Capillary 80  70 - 99 mg/dL   GLUCOSE, CAPILLARY     Status: Abnormal   Collection Time   04/14/12 11:44 AM      Component Value Range Comment   Glucose-Capillary 156 (*) 70 - 99 mg/dL   GLUCOSE, CAPILLARY     Status: Abnormal   Collection Time   04/14/12  4:50 PM      Component Value Range Comment   Glucose-Capillary 216 (*) 70 - 99 mg/dL   GLUCOSE, CAPILLARY     Status: Abnormal   Collection Time   04/14/12  9:10 PM      Component Value Range Comment   Glucose-Capillary 226 (*) 70 - 99 mg/dL   GLUCOSE, CAPILLARY     Status: Abnormal   Collection Time   04/15/12  7:25 AM      Component Value Range Comment   Glucose-Capillary 106 (*) 70 - 99 mg/dL    Comment 1 Documented in Chart     CBC WITH DIFFERENTIAL     Status: Abnormal   Collection Time   04/15/12  9:11 AM      Component Value Range Comment   WBC 9.9  4.0 - 10.5 K/uL    RBC 3.95 (*) 4.22 - 5.81 MIL/uL    Hemoglobin 11.0 (*) 13.0 - 17.0 g/dL    HCT 40.9 (*) 81.1 - 52.0 %    MCV 84.8  78.0 - 100.0 fL    MCH 27.8  26.0 - 34.0 pg    MCHC 32.8  30.0 - 36.0 g/dL    RDW 91.4  78.2 - 95.6 %    Platelets 266  150 - 400 K/uL    Neutrophils Relative 80 (*) 43 - 77 %    Neutro Abs 8.0 (*) 1.7 - 7.7 K/uL    Lymphocytes Relative 13  12 - 46 %    Lymphs Abs 1.3  0.7 - 4.0 K/uL    Monocytes Relative 7  3 - 12 %  Monocytes Absolute 0.6  0.1 - 1.0 K/uL    Eosinophils Relative 0  0 - 5 %    Eosinophils Absolute 0.0  0.0 - 0.7 K/uL    Basophils Relative 0  0 - 1 %    Basophils Absolute 0.0  0.0 - 0.1 K/uL   COMPREHENSIVE METABOLIC PANEL     Status: Abnormal   Collection Time   04/15/12  9:11 AM      Component Value Range Comment   Sodium 138  135 - 145 mEq/L    Potassium 3.7  3.5 - 5.1 mEq/L    Chloride 102  96 - 112 mEq/L    CO2 29  19 - 32 mEq/L    Glucose, Bld 193 (*) 70 - 99 mg/dL    BUN 23  6 - 23 mg/dL    Creatinine, Ser 1.61  0.50 - 1.35 mg/dL    Calcium 8.8  8.4 - 09.6 mg/dL    Total Protein 6.7  6.0 - 8.3 g/dL    Albumin 3.2 (*) 3.5 - 5.2 g/dL    AST 045 (*) 0 - 37 U/L    ALT 273 (*) 0 - 53 U/L    Alkaline Phosphatase 179 (*) 39 - 117 U/L    Total Bilirubin 0.6  0.3 - 1.2 mg/dL    GFR calc non Af Amer 61 (*) >90 mL/min    GFR calc Af Amer 70 (*) >90 mL/min   GLUCOSE, CAPILLARY     Status: Abnormal   Collection Time   04/15/12 12:11 PM      Component Value Range Comment   Glucose-Capillary 171 (*) 70 - 99 mg/dL   GLUCOSE, CAPILLARY     Status: Abnormal   Collection Time   04/15/12  4:35 PM      Component Value Range Comment   Glucose-Capillary 199 (*) 70 - 99 mg/dL   GLUCOSE, CAPILLARY     Status: Abnormal   Collection Time   04/15/12  8:24 PM      Component  Value Range Comment   Glucose-Capillary 394 (*) 70 - 99 mg/dL    Comment 1 Notify RN     GLUCOSE, CAPILLARY     Status: Abnormal   Collection Time   04/16/12  3:21 AM      Component Value Range Comment   Glucose-Capillary 183 (*) 70 - 99 mg/dL    Comment 1 Notify RN     GLUCOSE, CAPILLARY     Status: Normal   Collection Time   04/16/12  7:22 AM      Component Value Range Comment   Glucose-Capillary 90  70 - 99 mg/dL      HEENT: normal Cardio: RRR Resp: CTA B/L GI: BS positive and Distention Extremity:  No Edema Skin:   Intact Neuro: Alert/Oriented, Abnormal Sensory reduced LT on R side, Abnormal Motor 4/5 in R Delt Bi Tri Grip, 2-/5 R HE, Ankle DF/PF o/w 0/5 RLE, Abnormal FMC Ataxic/ dec FMC and Other atrophy R thigh and R Leg Musc/Skel:  Other contracture R knee flexors and R ankle Reduce R FNF   Assessment/Plan: 1. Functional deficits secondary to L thalamic infarct which require 3+ hours per day of interdisciplinary therapy in a comprehensive inpatient rehab setting. Physiatrist is providing close team supervision and 24 hour management of active medical problems listed below. Physiatrist and rehab team continue to assess barriers to discharge/monitor patient progress toward functional and medical goals. FIM: FIM - Bathing Bathing Steps Patient  Completed: Chest;Right Arm;Left Arm;Abdomen;Front perineal area;Buttocks;Right upper leg;Left upper leg;Right lower leg (including foot);Left lower leg (including foot) Bathing: 5: Supervision: Safety issues/verbal cues  FIM - Upper Body Dressing/Undressing Upper body dressing/undressing steps patient completed: Thread/unthread right sleeve of pullover shirt/dresss;Thread/unthread left sleeve of pullover shirt/dress;Put head through opening of pull over shirt/dress;Pull shirt over trunk Upper body dressing/undressing: 5: Supervision: Safety issues/verbal cues FIM - Lower Body Dressing/Undressing Lower body dressing/undressing steps  patient completed: Thread/unthread right underwear leg;Thread/unthread left underwear leg;Pull underwear up/down;Thread/unthread right pants leg;Thread/unthread left pants leg;Pull pants up/down;Fasten/unfasten pants;Don/Doff right sock;Don/Doff left sock;Don/Doff right shoe;Don/Doff left shoe;Fasten/unfasten right shoe;Fasten/unfasten left shoe Lower body dressing/undressing: 5: Supervision: Safety issues/verbal cues  FIM - Toileting Toileting steps completed by patient: Adjust clothing prior to toileting;Adjust clothing after toileting Toileting Assistive Devices:  (RW while stand to urinate) Toileting: 4: Steadying assist  FIM - Diplomatic Services operational officer Devices: Best boy Transfers: 4-To toilet/BSC: Min A (steadying Pt. > 75%);4-From toilet/BSC: Min A (steadying Pt. > 75%)  FIM - Bed/Chair Transfer Bed/Chair Transfer Assistive Devices: Therapist, occupational: 5: Chair or W/C > Bed: Supervision (verbal cues/safety issues);5: Bed > Chair or W/C: Supervision (verbal cues/safety issues)  FIM - Locomotion: Wheelchair Locomotion: Wheelchair: 2: Travels 50 - 149 ft with supervision, cueing or coaxing FIM - Locomotion: Ambulation Locomotion: Ambulation Assistive Devices: Designer, industrial/product Ambulation/Gait Assistance: 1: +1 Total assist Locomotion: Ambulation: 1: Travels less than 50 ft with minimal assistance (Pt.>75%)  Comprehension Comprehension Mode: Auditory Comprehension: 5-Follows basic conversation/direction: With no assist  Expression Expression Mode: Verbal Expression: 7-Expresses complex ideas: With no assist  Social Interaction Social Interaction: 7-Interacts appropriately with others - No medications needed.  Problem Solving Problem Solving: 5-Solves basic 90% of the time/requires cueing < 10% of the time  Memory Memory: 5-Requires cues to use assistive device  Problem List and Plan:  1. thrombotic left thalamus infarct , PPS 2. DVT  Prophylaxis/Anticoagulation: Subcutaneous Lovenox. Monitor platelet counts any signs of bleeding  3. Neuropsych: This patient is capable of making decisions on his/her own behalf.  4. Hypertension. Norvasc 5 mg daily. Monitor with increased mobility  5. Diabetes mellitus with peripheral neuropathy. Hemoglobin A1c 7.9. Levemir 32 units twice a day. Check blood sugars a.c. and at bedtime. Continue Neurontin 800 mg 3 times a day for neuropathy   -resumed part of home metformin dose 500mg  qday (1000 bid at home) 6. Hyperlipidemia. Lipitor  7. BPH. Flomax/Enablex. Check PVRs x3. Check urine study. Encourage patient to stand to void    LOS (Days) 4 A FACE TO FACE EVALUATION WAS PERFORMED  SWARTZ,ZACHARY T 04/16/2012, 8:35 AM

## 2012-04-16 NOTE — Progress Notes (Signed)
Physical Therapy Note  Patient Details  Name: Ege Muckey MRN: [id number] Date of Birth: 1935-10-22 Today's Date: 04/16/2012  Time: 1340-1407 27 minutes  No c/o pain.  Gait training with R LE ace wrapped with supervision for side stepping, fwd/bkwd stepping, obstacle negotiation.  Curb training with min A for lifting RW up/down curb, cues for safety.  Pt with good safety awareness and improving strength and activity tolerance.  Individual therapy   Jamelyn Bovard 04/16/2012, 3:59 PM

## 2012-04-16 NOTE — Progress Notes (Signed)
Physical Therapy Note  Patient Details  Name: Mike Mack MRN: [id number] Date of Birth: 1935-07-03 Today's Date: 04/16/2012  Time: 1000-1055 55 minutes  No c/o pain.  Gait training with L heel lift, R knee wrapped for increased support.  Pt able to gait further with R knee ace wrapped. Pt supervision to gait multiple attempts of > 100' with improved foot clearance, no LOB.  Gait training with SPC for balance and increased wt bearing on R LE.  Pt able to perform with min A, step to pattern with L LE, pt fatigues after 25'.  Discussed AD with pt and he agrees that RW is best choice for now.  Standing balance training without UE support with reaching tasks with min A for reaching and bending to R due to LE weakness.  Nu step with tactile cues for R LE (no UE assistance).  Pt able to perform 4 x 15 steps with R LE with 1 minute rest in between due to fatigue.  Pt with much improved strength and endurance today.  Individual therapy   Nasrin Lanzo 04/16/2012, 10:56 AM

## 2012-04-16 NOTE — Progress Notes (Signed)
Occupational Therapy Note  Patient Details  Name: Mike Mack MRN: [id number] Date of Birth: 1935-07-01 Today's Date: 04/16/2012  Time: 6213-0865 Pt denies pain Individual therapy Pt engaged in PNF exercises with focus on core stability.  Pt exhibited increased difficulty maintaining dynamic sitting balance with fatigue.  Pt transitioned to FM tasks inclduing reaching across midline to let using RUE.  Pt again exhibited difficulty maintaining dynamic sitting balance with fatigue.  Pt did not exhibit any balance reactions in sitting.  Focus on dynamic sitting balance, core stability, and RUE FM skills.   Lavone Neri [facility] 04/16/2012, 9:58 AM

## 2012-04-16 NOTE — Progress Notes (Signed)
Orthopedic Tech Progress Note Patient Details:  Mike Mack 1935-08-02 [id number]  Patient ID: [name], male   DOB: 1936/01/29, 76 y.o.   MRN: [id number]   [name] 04/16/2012, 10:55 AM CALLED JEFF SMITH FROM [facility] FOR LEFT SHOE LIFT

## 2012-04-16 NOTE — Progress Notes (Signed)
This note has been reviewed and this clinician agrees with information provided.  

## 2012-04-16 NOTE — Progress Notes (Signed)
Occupational Therapy Session Note  Patient Details  Name: Mike Mack MRN: [id number] Date of Birth: [birth year]  Today's Date: 04/16/2012 Time:  0730- 0825  55 min  Short Term Goals: Week 1:  OT Short Term Goal 1 (Week 1): Patient will complete functional transfers with modified independence OT Short Term Goal 2 (Week 1): Patient will demonstrate ability to complete home exercise program to improve right upper extremity motor control with supervision OT Short Term Goal 3 (Week 1): Patient will complete dressing, sitting and standing, without loss of balance, independently modified with use of AE and DME, prn OT Short Term Goal 4 (Week 1): Patient will complete 60 minutes of therapeutic activity to improve endurance with 1 rest break.  Skilled Therapeutic Interventions/Progress Updates:    Self care retraining with bathing, dressing and grooming tasks focusing on tub/shower transfer and completion of bathing in home-like environment, functional mobility with RW household distances, and standing balance during functional tasks. Performed stand pivot transfer bed to w/c and propelled self to therapy BR. Performed tub/shower transfer using grab bars in front and R side (pt states he has grab bars at home) and completed bathing tasks sitting on shower chair and sit<>stand for buttocks/periarea cleansing. Sit<>stands for dressing tasks from w/c level with RW for unilateral UE support for dynamic balance. Functional gait with RW down hallway with S.  Therapy Documentation Precautions:  Precautions Precautions: Fall Precaution Comments: Post polio syndrome with Right LE atrophy prior to stroke resulting in Right side weakness. Restrictions Weight Bearing Restrictions: No  Pain:  no c/o pain this AM See FIM for current functional status  Therapy/Group: Individual Therapy  Jackelyn Poling 04/16/2012, 8:39 AM

## 2012-04-17 ENCOUNTER — Inpatient Hospital Stay (HOSPITAL_COMMUNITY): Payer: Medicare Other | Admitting: Physical Therapy

## 2012-04-17 ENCOUNTER — Inpatient Hospital Stay (HOSPITAL_COMMUNITY): Payer: Medicare Other | Admitting: *Deleted

## 2012-04-17 ENCOUNTER — Inpatient Hospital Stay (HOSPITAL_COMMUNITY): Payer: Medicare Other

## 2012-04-17 DIAGNOSIS — I6330 Cerebral infarction due to thrombosis of unspecified cerebral artery: Secondary | ICD-10-CM

## 2012-04-17 DIAGNOSIS — G8110 Spastic hemiplegia affecting unspecified side: Secondary | ICD-10-CM

## 2012-04-17 LAB — GLUCOSE, CAPILLARY
Glucose-Capillary: 75 mg/dL (ref 70–99)
Glucose-Capillary: 130 mg/dL — ABNORMAL HIGH (ref 70–99)

## 2012-04-17 MED ORDER — METFORMIN HCL 500 MG PO TABS
500.0000 mg | ORAL_TABLET | Freq: Two times a day (BID) | ORAL | Status: DC
  Administered 2012-04-17 – 2012-04-20 (×6): 500 mg via ORAL
  Filled 2012-04-17 (×8): qty 1

## 2012-04-17 NOTE — Progress Notes (Signed)
Physical Therapy Note  Patient Details  Name: Mike Mack MRN: [id number] Date of Birth: June 24, 1935 Today's Date: 04/17/2012  Time: 1030-1100 30 minutes  No c/o pain.  Gait training with knee immobilized in extension with ace wrap/ruler.  Pt reports he feels secure with this bracing and was supervision with gait with no hyperextension and no episodes of buckling.  Stair training x 1 flight with supervision.  Pt currently with much improved safety and activity tolerance.  Individual therapy   DONAWERTH,KAREN 04/17/2012, 12:17 PM

## 2012-04-17 NOTE — Progress Notes (Signed)
Occupational Therapy Session Note  Patient Details  Name: Mike Mack MRN: [id number] Date of Birth: 02/19/1936  Today's Date: 04/17/2012 Time: 1100-1155 Time Calculation (min): 55 min  Short Term Goals: Week 1:  OT Short Term Goal 1 (Week 1): Patient will complete functional transfers with modified independence OT Short Term Goal 2 (Week 1): Patient will demonstrate ability to complete home exercise program to improve right upper extremity motor control with supervision OT Short Term Goal 3 (Week 1): Patient will complete dressing, sitting and standing, without loss of balance, independently modified with use of AE and DME, prn OT Short Term Goal 4 (Week 1): Patient will complete 60 minutes of therapeutic activity to improve endurance with 1 rest break.  Skilled Therapeutic Interventions/Progress Updates:    Pt engaged in ADL retraining including bathing at tub/shower level and dressing with sit<>stand from chair.  Pt gathered all clothing prior to rolling to tub room in w/c.  Pt stated he will have w/c at home but will not fit into bathroom.  Pt amb with RW into tub room to step over into tub using grab bars.  Pt completed all tasks at supervision level.  Focus on safety awareness, functional ambulation, dynamic standing balance, and problem solving.  Therapy Documentation Precautions:  Precautions Precautions: Fall Precaution Comments: Post polio syndrome with Right LE atrophy prior to stroke resulting in Right side weakness. Restrictions Weight Bearing Restrictions: No   Pain: Pain Assessment Pain Assessment: No/denies pain  See FIM for current functional status  Therapy/Group: Individual Therapy  Rich Brave 04/17/2012, 12:00 PM

## 2012-04-17 NOTE — Progress Notes (Signed)
Social Work Patient ID: Mike Mack, male   DOB: 1936-06-11, 76 y.o.   MRN: [id number]  Have reviewed team conference with both patient and wife.  Both pleased with goals of modified independent to supervision with targeted d/c 04/20/12.  Will continue to follow.  Lourdez Mcgahan

## 2012-04-17 NOTE — Progress Notes (Signed)
Physical Therapy Note  Patient Details  Name: Mike Mack MRN: [id number] Date of Birth: 1935/08/11 Today's Date: 04/17/2012  Time: 1300-1400 60 minutes  Pt participated in dynamic balance and gait group.  Standing bocce ball game with close supervision for balance with 1 UE support.  Gait training with obstacle negotiation, household environment with close supervision with RW.  Group therapy   Jay Haskew 04/17/2012, 5:31 PM

## 2012-04-17 NOTE — Patient Care Conference (Signed)
Inpatient RehabilitationTeam Conference Note Date: 04/16/2012   Time: 3:00 PM    Patient Name: Mike Mack      Medical Record Number: [id number]  Date of Birth: [birth year] Sex: Male         Room/Bed: 4030/4030-01 Payor Info: Payor: Advertising copywriter MEDICARE  Plan: [employer] MEDICARE COMPLETE  Product Type: *No Product type*     Admitting Diagnosis: L CVA  Admit Date/Time:  04/12/2012  5:02 PM Admission Comments: No comment available   Primary Diagnosis:  Stroke Principal Problem: Stroke  Patient Active Problem List   Diagnosis Date Noted  . Stroke 04/15/2012  . Acute left thalamic CVA (cerebral infarction) 04/10/2012  . Abnormal CT of brain 04/10/2012  . Diabetes mellitus without complication   . Hypertension   . BPH (benign prostatic hyperplasia)   . Neuropathy     Expected Discharge Date: Expected Discharge Date: 04/20/12  Team Members Present: Physician: Dr. Faith Rogue Social Worker Present: Amada Jupiter, LCSW Nurse Present: Carlean Purl, RN PT Present: Reggy Eye, PT OT Present: Ardis Rowan, COTA SLP Present: Feliberto Gottron, SLP Other (Discipline and Name): Tora Duck, PPS Coordinator     Current Status/Progress Goal Weekly Team Focus  Medical   left thalamic infarct, post polio syndrom  stroke ed, pain mgt  see above, focusing on therapies   Bowel/Bladder   continent bowel and bladder LPM 04/14/12  min assistno      Swallow/Nutrition/ Hydration             ADL's   min A overall  overall mod I  balance, trunk stability, standing balance, right UE coordination   Mobility   min A - supervision  supervision  family ed   Communication             Safety/Cognition/ Behavioral Observations  no unsafe behavior         Pain   min asssist  no unsafe behavoir      Skin   min assist  no new skin breakdown       Rehab Goals Patient on target to meet rehab goals: Yes *See Interdisciplinary Assessment and Plan and progress notes for long and  short-term goals  Barriers to Discharge: right sided hemi-sensory deficits    Possible Resolutions to Barriers:  adaptive techniques, equipment, family ed.     Discharge Planning/Teaching Needs:  home with wife who can provide assist, however, does still work p/t      Team Discussion:  Making good progress. No concerns and anticipate mod i goals - supervision  Revisions to Treatment Plan:  none   Continued Need for Acute Rehabilitation Level of Care: The patient requires daily medical management by a physician with specialized training in physical medicine and rehabilitation for the following conditions: Daily direction of a multidisciplinary physical rehabilitation program to ensure safe treatment while eliciting the highest outcome that is of practical value to the patient.: Yes Daily medical management of patient stability for increased activity during participation in an intensive rehabilitation regime.: Yes Daily analysis of laboratory values and/or radiology reports with any subsequent need for medication adjustment of medical intervention for : Neurological problems;Cardiac problems  Skyra Crichlow 04/17/2012, 12:46 PM

## 2012-04-17 NOTE — Progress Notes (Signed)
[facility]patient Rehabilitation Center Individual Statement of Services  Patient Name:  Mike Mack  Date:  04/17/2012  Welcome to the Inpatient Rehabilitation [facility].  Our goal is to provide you with an individualized program based on your diagnosis and situation, designed to meet your specific needs.  With this comprehensive rehabilitation program, you will be expected to participate in at least 3 hours of rehabilitation therapies Monday-Friday, with modified therapy programming on the weekends.  Your rehabilitation program will include the following services:  Physical Therapy (PT), Occupational Therapy (OT), Speech Therapy (ST), 24 hour per day rehabilitation nursing, Therapeutic Recreaction (TR), Neuropsychology, Case Management ( Social Worker), Rehabilitation Medicine, Nutrition Services and Pharmacy Services  Weekly team conferences will be held on Tuesdays to discuss your progress.  Your  Social Worker will talk with you frequently to get your input and to update you on team discussions.  Team conferences with you and your family in attendance may also be held.  Expected length of stay: 7 days  Overall anticipated outcome: modified independent  Depending on your progress and recovery, your program may change.  Your  Social Worker will coordinate services and will keep you informed of any changes.  Your  Spcial Worker's name and contact numbers are listed  below.  The following services may also be recommended but are not provided by the Inpatient Rehabilitation Center:   Driving Evaluations  Home Health Rehabiltiation Services  Outpatient Rehabilitatation [facility]  Vocational Rehabilitation   Arrangements will be made to provide these services after discharge if needed.  Arrangements include referral to agencies that provide these services.  Your insurance has been verified to be:  [place] Your primary doctor is:  Dr. Juleen China  Pertinent information will be shared with your  doctor and your insurance company.  Social Worker:  [place], [place] [phone] or ([phone]  Information discussed with and copy given to patient by: Amada Jupiter, 04/17/2012, 3:46 PM

## 2012-04-17 NOTE — Progress Notes (Signed)
Patient ID: Mike Mack, male   DOB: 25-Dec-1935, 76 y.o.   MRN: [id number]   Subjective/Complaints: Good appetite. Slept well No new complaints  Review of Systems  Gastrointestinal: Positive for constipation.  Genitourinary: Positive for frequency.  All other systems reviewed and are negative.     Objective: Vital Signs: Blood pressure 113/55, pulse 73, temperature 98.4 F (36.9 C), temperature source Oral, resp. rate 18, height 5\' 5"  (1.651 m), weight 61.8 kg (136 lb 3.9 oz), SpO2 100.00%. No results found. Results for orders placed during the hospital encounter of 04/12/12 (from the past 72 hour(s))  GLUCOSE, CAPILLARY     Status: Abnormal   Collection Time   04/14/12 11:44 AM      Component Value Range Comment   Glucose-Capillary 156 (*) 70 - 99 mg/dL   GLUCOSE, CAPILLARY     Status: Abnormal   Collection Time   04/14/12  4:50 PM      Component Value Range Comment   Glucose-Capillary 216 (*) 70 - 99 mg/dL   GLUCOSE, CAPILLARY     Status: Abnormal   Collection Time   04/14/12  9:10 PM      Component Value Range Comment   Glucose-Capillary 226 (*) 70 - 99 mg/dL   GLUCOSE, CAPILLARY     Status: Abnormal   Collection Time   04/15/12  7:25 AM      Component Value Range Comment   Glucose-Capillary 106 (*) 70 - 99 mg/dL    Comment 1 Documented in Chart     CBC WITH DIFFERENTIAL     Status: Abnormal   Collection Time   04/15/12  9:11 AM      Component Value Range Comment   WBC 9.9  4.0 - 10.5 K/uL    RBC 3.95 (*) 4.22 - 5.81 MIL/uL    Hemoglobin 11.0 (*) 13.0 - 17.0 g/dL    HCT 14.7 (*) 82.9 - 52.0 %    MCV 84.8  78.0 - 100.0 fL    MCH 27.8  26.0 - 34.0 pg    MCHC 32.8  30.0 - 36.0 g/dL    RDW 56.2  13.0 - 86.5 %    Platelets 266  150 - 400 K/uL    Neutrophils Relative 80 (*) 43 - 77 %    Neutro Abs 8.0 (*) 1.7 - 7.7 K/uL    Lymphocytes Relative 13  12 - 46 %    Lymphs Abs 1.3  0.7 - 4.0 K/uL    Monocytes Relative 7  3 - 12 %    Monocytes Absolute 0.6  0.1 - 1.0  K/uL    Eosinophils Relative 0  0 - 5 %    Eosinophils Absolute 0.0  0.0 - 0.7 K/uL    Basophils Relative 0  0 - 1 %    Basophils Absolute 0.0  0.0 - 0.1 K/uL   COMPREHENSIVE METABOLIC PANEL     Status: Abnormal   Collection Time   04/15/12  9:11 AM      Component Value Range Comment   Sodium 138  135 - 145 mEq/L    Potassium 3.7  3.5 - 5.1 mEq/L    Chloride 102  96 - 112 mEq/L    CO2 29  19 - 32 mEq/L    Glucose, Bld 193 (*) 70 - 99 mg/dL    BUN 23  6 - 23 mg/dL    Creatinine, Ser 7.84  0.50 - 1.35 mg/dL    Calcium 8.8  8.4 -  10.5 mg/dL    Total Protein 6.7  6.0 - 8.3 g/dL    Albumin 3.2 (*) 3.5 - 5.2 g/dL    AST 161 (*) 0 - 37 U/L    ALT 273 (*) 0 - 53 U/L    Alkaline Phosphatase 179 (*) 39 - 117 U/L    Total Bilirubin 0.6  0.3 - 1.2 mg/dL    GFR calc non Af Amer 61 (*) >90 mL/min    GFR calc Af Amer 70 (*) >90 mL/min   GLUCOSE, CAPILLARY     Status: Abnormal   Collection Time   04/15/12 12:11 PM      Component Value Range Comment   Glucose-Capillary 171 (*) 70 - 99 mg/dL   GLUCOSE, CAPILLARY     Status: Abnormal   Collection Time   04/15/12  4:35 PM      Component Value Range Comment   Glucose-Capillary 199 (*) 70 - 99 mg/dL   GLUCOSE, CAPILLARY     Status: Abnormal   Collection Time   04/15/12  8:24 PM      Component Value Range Comment   Glucose-Capillary 394 (*) 70 - 99 mg/dL    Comment 1 Notify RN     GLUCOSE, CAPILLARY     Status: Abnormal   Collection Time   04/16/12  3:21 AM      Component Value Range Comment   Glucose-Capillary 183 (*) 70 - 99 mg/dL    Comment 1 Notify RN     GLUCOSE, CAPILLARY     Status: Normal   Collection Time   04/16/12  7:22 AM      Component Value Range Comment   Glucose-Capillary 90  70 - 99 mg/dL   GLUCOSE, CAPILLARY     Status: Abnormal   Collection Time   04/16/12 11:31 AM      Component Value Range Comment   Glucose-Capillary 142 (*) 70 - 99 mg/dL    Comment 1 Notify RN     HEPATIC FUNCTION PANEL     Status: Abnormal    Collection Time   04/16/12 12:51 PM      Component Value Range Comment   Total Protein 7.5  6.0 - 8.3 g/dL    Albumin 3.7  3.5 - 5.2 g/dL    AST 57 (*) 0 - 37 U/L    ALT 167 (*) 0 - 53 U/L    Alkaline Phosphatase 178 (*) 39 - 117 U/L    Total Bilirubin 0.2 (*) 0.3 - 1.2 mg/dL    Bilirubin, Direct <0.9  0.0 - 0.3 mg/dL    Indirect Bilirubin NOT CALCULATED  0.3 - 0.9 mg/dL   GLUCOSE, CAPILLARY     Status: Abnormal   Collection Time   04/16/12  4:21 PM      Component Value Range Comment   Glucose-Capillary 169 (*) 70 - 99 mg/dL    Comment 1 Notify RN     GLUCOSE, CAPILLARY     Status: Abnormal   Collection Time   04/16/12 10:21 PM      Component Value Range Comment   Glucose-Capillary 209 (*) 70 - 99 mg/dL    Comment 1 Notify RN     GLUCOSE, CAPILLARY     Status: Normal   Collection Time   04/17/12  7:30 AM      Component Value Range Comment   Glucose-Capillary 75  70 - 99 mg/dL    Comment 1 Notify RN  HEENT: normal Cardio: RRR Resp: CTA B/L GI: BS positive and Distention Extremity:  No Edema Skin:   Intact Neuro: Alert/Oriented, Abnormal Sensory reduced LT on R side, Abnormal Motor 4/5 in R Delt Bi Tri Grip, 2-/5 R HE, Ankle DF/PF o/w 0/5 RLE, Abnormal FMC Ataxic/ dec FMC and Other atrophy R thigh and R Leg Musc/Skel:  Other contracture R knee flexors and R ankle Reduce R FNF   Assessment/Plan: 1. Functional deficits secondary to L thalamic infarct which require 3+ hours per day of interdisciplinary therapy in a comprehensive inpatient rehab setting. Physiatrist is providing close team supervision and 24 hour management of active medical problems listed below. Physiatrist and rehab team continue to assess barriers to discharge/monitor patient progress toward functional and medical goals.   -shoe build up for left leg  -knee brace on right FIM: FIM - Bathing Bathing Steps Patient Completed: Chest;Right Arm;Left Arm;Abdomen;Front perineal area;Buttocks;Right upper  leg;Left upper leg;Right lower leg (including foot);Left lower leg (including foot) Bathing: 5: Supervision: Safety issues/verbal cues  FIM - Upper Body Dressing/Undressing Upper body dressing/undressing steps patient completed: Thread/unthread right sleeve of pullover shirt/dresss;Thread/unthread left sleeve of pullover shirt/dress;Put head through opening of pull over shirt/dress;Pull shirt over trunk Upper body dressing/undressing: 6: More than reasonable amount of time FIM - Lower Body Dressing/Undressing Lower body dressing/undressing steps patient completed: Thread/unthread right underwear leg;Thread/unthread left underwear leg;Pull underwear up/down;Thread/unthread right pants leg;Thread/unthread left pants leg;Pull pants up/down;Don/Doff right sock;Don/Doff left sock;Don/Doff right shoe;Don/Doff left shoe;Fasten/unfasten left shoe;Fasten/unfasten right shoe Lower body dressing/undressing: 5: Supervision: Safety issues/verbal cues  FIM - Toileting Toileting steps completed by patient: Adjust clothing prior to toileting;Adjust clothing after toileting Toileting Assistive Devices:  (RW while stand to urinate) Toileting: 4: Steadying assist  FIM - Diplomatic Services operational officer Devices: Best boy Transfers: 4-To toilet/BSC: Min A (steadying Pt. > 75%);4-From toilet/BSC: Min A (steadying Pt. > 75%)  FIM - Bed/Chair Transfer Bed/Chair Transfer Assistive Devices: Therapist, occupational: 5: Chair or W/C > Bed: Supervision (verbal cues/safety issues);5: Bed > Chair or W/C: Supervision (verbal cues/safety issues)  FIM - Locomotion: Wheelchair Locomotion: Wheelchair: 5: Travels 150 ft or more: maneuvers on rugs and over door sills with supervision, cueing or coaxing FIM - Locomotion: Ambulation Locomotion: Ambulation Assistive Devices: Designer, industrial/product Ambulation/Gait Assistance: 1: +1 Total assist Locomotion: Ambulation: 2: Travels 50 - 149 ft with  supervision/safety issues  Comprehension Comprehension Mode: Auditory Comprehension: 5-Follows basic conversation/direction: With no assist  Expression Expression Mode: Verbal Expression: 7-Expresses complex ideas: With no assist  Social Interaction Social Interaction: 7-Interacts appropriately with others - No medications needed.  Problem Solving Problem Solving: 5-Solves complex 90% of the time/cues < 10% of the time  Memory Memory: 5-Recognizes or recalls 90% of the time/requires cueing < 10% of the time  Problem List and Plan:  1. thrombotic left thalamus infarct. PPS 2. DVT Prophylaxis/Anticoagulation: Subcutaneous Lovenox. Monitor platelet counts any signs of bleeding  3. Neuropsych: This patient is capable of making decisions on his/her own behalf.  4. Hypertension. Norvasc 5 mg daily. Monitor with increased mobility  5. Diabetes mellitus with peripheral neuropathy. Hemoglobin A1c 7.9. Levemir 32 units twice a day. Check blood sugars a.c. and at bedtime. Continue Neurontin 800 mg 3 times a day for neuropathy   -resumed part of home metformin dose 500mg  qday --increase to bid (1000 bid at home) 6. Hyperlipidemia. Lipitor  7. BPH. Flomax/Enablex. Check PVRs x3. Check urine study. Encourage patient to stand to void  LOS (Days) 5 A FACE TO FACE EVALUATION WAS PERFORMED  SWARTZ,ZACHARY T 04/17/2012, 7:45 AM

## 2012-04-17 NOTE — Progress Notes (Signed)
Social Work Social Work Assessment and Plan  Patient Details  Name: Mike Mack MRN: [id number] Date of Birth: Aug 13, 1935  Today's Date: 04/17/2012  Problem List:  Patient Active Problem List  Diagnosis  . Diabetes mellitus without complication  . Hypertension  . BPH (benign prostatic hyperplasia)  . Neuropathy  . Acute left thalamic CVA (cerebral infarction)  . Abnormal CT of brain  . Stroke   Past Medical History:  Past Medical History  Diagnosis Date  . Diabetes mellitus without complication   . Hypertension   . Small bowel obstruction   . BPH (benign prostatic hyperplasia)   . Neuropathy    Past Surgical History:  Past Surgical History  Procedure Date  . Bowel resection   . Hernia repair   . Cataract extraction w/ intraocular lens  implant, bilateral    Social History:  reports that he has never smoked. He has never used smokeless tobacco. He reports that he does not drink alcohol or use illicit drugs.  Family / Support Systems Marital Status: Married Patient Roles: Spouse;Parent Spouse/Significant Other: wife, Luismanuel Shontz @ [phone] ir [employer]ney Children: son, Aurther Loft Rolene Arbour); son, Tawanna Cooler [facility]) and daughter, Sunny Schlein [facility]) Anticipated Caregiver: Wife and daughter Ability/Limitations of Caregiver: Wife works PT 3 days a week, but may take time off.  Can get daughter to stay with him while wife at work. (Wife works 6a-3:30p, Dtr to work at 4 pm.) Caregiver Availability: 24/7 Family Dynamics: Pt describes children and wife as extremely supportive and willing to provide 24/7 assistance  Social History Preferred language: English Religion:  Cultural Background: NA Education: HS Read: Yes Write: Yes Employment Status: Retired Date Retired/Disabled/Unemployed: 10 years Fish farm manager Issues: none Guardian/Conservator: none   Abuse/Neglect Physical Abuse: Denies Verbal Abuse: Denies Sexual Abuse: Denies Exploitation  of patient/patient's resources: Denies Self-Neglect: Denies  Emotional Status Pt's affect, behavior adn adjustment status: pt very soft-spoken throughout interview but fully oriented and able to complete assessment.  Denies any emotional distress and no h/o of issues.  Notes his large family is very supportive and that gives him the strength to cope.   Recent Psychosocial Issues: none Pyschiatric History: none Substance Abuse History: none  Patient / Family Perceptions, Expectations & Goals Pt/Family understanding of illness & functional limitations: pt with basic understanding of his stroke which he reports is the result of blood pressure and DM complications.  Good awareness of current functional deficits and need for therapies. Premorbid pt/family roles/activities: Fully independent and active Anticipated changes in roles/activities/participation: little change anticipated if he is able to reach mod i goals Pt/family expectations/goals: modified independent if possible as wife does work p/t  [employer]: None Premorbid Home Care/DME Agencies: None Transportation available at discharge: yes Resource referrals recommended: Support group (specify) (Stroke support Group)  Discharge Planning Living Arrangements: Spouse/significant other Support Systems: Spouse/significant other;Children;Other relatives Type of Residence: Private residence Insurance Resources: [employer] Resources: Restaurant manager, fast food Screen Referred: No Living Expenses: Database administrator Management: Patient Do you have any problems obtaining your medications?: No Home Management: pt and wife Patient/Family Preliminary Plans: Pt plans to return home with his wife as primary support.  If assistance needed while she is at work, then his daughter is available to provide this. Social Work Anticipated Follow Up Needs: HH/OP;Support Group Expected length of stay: 7 days  Clinical  Impression Pleasant, oriented but soft-spoken gentleman here after having a stroke.  Anticipate good gains and mod i goals.  No emotional distress noted  or reported, however, will monitor throughout stay.  Kasheena Sambrano 04/17/2012, 3:06 PM

## 2012-04-17 NOTE — Progress Notes (Signed)
Physical Therapy Session Note  Patient Details  Name: Mike Mack MRN: [id number] Date of Birth: February 26, 1936  Today's Date: 04/17/2012 Time: 4401-0272 Time Calculation (min): 43 min  Skilled Therapeutic Interventions/Progress Updates:  Tx focused on gait training with RW in tight spaces and home environment and functional transfer training.  Pt in bed upon arrival, performed supine>sit with S only and sat EOB for dressing with S only as well. Pt stood to don pants with S. Pt had 1 LOB EOB while donning briefs, but able to recover.  Pt stood at sink x8 min with 1 UE assist and close S with cues for increasing R LE weight bearing as able.   Pt gait 1x75, 1x50 in controlled environemnt and 3x20' in home environment on carpet. Pt needed close S overall, especially on carpet due to decreased R foot clearance. Pt responds well to cues for RW proximity to self and increasing UE weight bearing prn for RLE support. Pt continues to have decreased clearance with fatigue. Ace wrap provided for R knee support, but no shoes - only grip socks.   Performed mobility in home environment on carpet with turns and in kitchen, practicing reaching and placing dishes to counter with close S. Pt also performed furniture transfers with close S.   WC propulsion 2x150' with S for directions and cues for leg rest management.       Therapy Documentation Precautions:  Precautions Precautions: Fall Precaution Comments: Post polio syndrome with Right LE atrophy prior to stroke resulting in Right side weakness. Restrictions Weight Bearing Restrictions: No    Pain: No complaints   Locomotion : Ambulation Ambulation/Gait Assistance: 5: Supervision Wheelchair Mobility Distance: 150   See FIM for current functional status  Therapy/Group: Individual Therapy  Virl Cagey, PT 04/17/2012, 10:02 AM

## 2012-04-18 ENCOUNTER — Inpatient Hospital Stay (HOSPITAL_COMMUNITY): Payer: Medicare Other | Admitting: Physical Therapy

## 2012-04-18 ENCOUNTER — Encounter (HOSPITAL_COMMUNITY): Payer: Medicare Other | Admitting: Occupational Therapy

## 2012-04-18 ENCOUNTER — Inpatient Hospital Stay (HOSPITAL_COMMUNITY): Payer: Medicare Other | Admitting: Occupational Therapy

## 2012-04-18 LAB — GLUCOSE, CAPILLARY
Glucose-Capillary: 80 mg/dL (ref 70–99)
Glucose-Capillary: 172 mg/dL — ABNORMAL HIGH (ref 70–99)

## 2012-04-18 MED ORDER — BARRIER CREAM NON-SPECIFIED
1.0000 "application " | TOPICAL_CREAM | Freq: Two times a day (BID) | TOPICAL | Status: DC | PRN
  Filled 2012-04-18: qty 1

## 2012-04-18 NOTE — Progress Notes (Signed)
Occupational Therapy Session Note  Patient Details  Name: Mike Mack MRN: [id number] Date of Birth: 1935/11/09  Today's Date: 04/18/2012  Short Term Goals: Week 1:  OT Short Term Goal 1 (Week 1): Patient will complete functional transfers with modified independence OT Short Term Goal 2 (Week 1): Patient will demonstrate ability to complete home exercise program to improve right upper extremity motor control with supervision OT Short Term Goal 3 (Week 1): Patient will complete dressing, sitting and standing, without loss of balance, independently modified with use of AE and DME, prn OT Short Term Goal 4 (Week 1): Patient will complete 60 minutes of therapeutic activity to improve endurance with 1 rest break.  Skilled Therapeutic Interventions/Progress Updates:    1030-1130 Self care retraining with bathing at tub/shower level, dressing, and grooming in home-like set-up focusing on safe functional mobility with RW, dynamic standing balance for clothing management tasks, set-up of environment for self-care routine and family education (pt's wife arrived near end of session) in prep for d/c home. Discussed recommendation for [facility] for home due to pt's wife works during day and recommending S for ambulation.   4098-1191 Self care retraining with focus on squat-pivot transfer to [facility] and w/c mobility around household environment. Pt set-up w/c and performed transfer w/c to [facility] Mod I with Min verbal cues after instruction/demonstration. Maneuvered w/c around ADL kitchen for simple meal prep activity and problem solved ways to set-up environment at home for easier/safer retrieval of needed items.   Therapy Documentation Precautions:  Precautions Precautions: Fall Precaution Comments: Post polio syndrome with Right LE atrophy prior to stroke resulting in Right side weakness. Restrictions Weight Bearing Restrictions: No Pain:  No c/o pain See FIM for current functional status  Therapy/Group:  Individual Therapy  Jackelyn Poling 04/18/2012, 2:53 PM

## 2012-04-18 NOTE — Progress Notes (Signed)
Patient ID: Mike Mack, male   DOB: March 23, 1936, 76 y.o.   MRN: [id number]   Subjective/Complaints: Complains of irritation in scrotum No new complaints  Review of Systems  Gastrointestinal: Positive for constipation.  Genitourinary: Positive for frequency.  All other systems reviewed and are negative.     Objective: Vital Signs: Blood pressure 130/77, pulse 63, temperature 98 F (36.7 C), temperature source Oral, resp. rate 16, height 5\' 5"  (1.651 m), weight 65.4 kg (144 lb 2.9 oz), SpO2 98.00%. No results found. Results for orders placed during the hospital encounter of 04/12/12 (from the past 72 hour(s))  CBC WITH DIFFERENTIAL     Status: Abnormal   Collection Time   04/15/12  9:11 AM      Component Value Range Comment   WBC 9.9  4.0 - 10.5 K/uL    RBC 3.95 (*) 4.22 - 5.81 MIL/uL    Hemoglobin 11.0 (*) 13.0 - 17.0 g/dL    HCT 40.9 (*) 81.1 - 52.0 %    MCV 84.8  78.0 - 100.0 fL    MCH 27.8  26.0 - 34.0 pg    MCHC 32.8  30.0 - 36.0 g/dL    RDW 91.4  78.2 - 95.6 %    Platelets 266  150 - 400 K/uL    Neutrophils Relative 80 (*) 43 - 77 %    Neutro Abs 8.0 (*) 1.7 - 7.7 K/uL    Lymphocytes Relative 13  12 - 46 %    Lymphs Abs 1.3  0.7 - 4.0 K/uL    Monocytes Relative 7  3 - 12 %    Monocytes Absolute 0.6  0.1 - 1.0 K/uL    Eosinophils Relative 0  0 - 5 %    Eosinophils Absolute 0.0  0.0 - 0.7 K/uL    Basophils Relative 0  0 - 1 %    Basophils Absolute 0.0  0.0 - 0.1 K/uL   COMPREHENSIVE METABOLIC PANEL     Status: Abnormal   Collection Time   04/15/12  9:11 AM      Component Value Range Comment   Sodium 138  135 - 145 mEq/L    Potassium 3.7  3.5 - 5.1 mEq/L    Chloride 102  96 - 112 mEq/L    CO2 29  19 - 32 mEq/L    Glucose, Bld 193 (*) 70 - 99 mg/dL    BUN 23  6 - 23 mg/dL    Creatinine, Ser 2.13  0.50 - 1.35 mg/dL    Calcium 8.8  8.4 - 08.6 mg/dL    Total Protein 6.7  6.0 - 8.3 g/dL    Albumin 3.2 (*) 3.5 - 5.2 g/dL    AST 578 (*) 0 - 37 U/L    ALT 273 (*) 0  - 53 U/L    Alkaline Phosphatase 179 (*) 39 - 117 U/L    Total Bilirubin 0.6  0.3 - 1.2 mg/dL    GFR calc non Af Amer 61 (*) >90 mL/min    GFR calc Af Amer 70 (*) >90 mL/min   GLUCOSE, CAPILLARY     Status: Abnormal   Collection Time   04/15/12 12:11 PM      Component Value Range Comment   Glucose-Capillary 171 (*) 70 - 99 mg/dL   GLUCOSE, CAPILLARY     Status: Abnormal   Collection Time   04/15/12  4:35 PM      Component Value Range Comment   Glucose-Capillary 199 (*)  70 - 99 mg/dL   GLUCOSE, CAPILLARY     Status: Abnormal   Collection Time   04/15/12  8:24 PM      Component Value Range Comment   Glucose-Capillary 394 (*) 70 - 99 mg/dL    Comment 1 Notify RN     GLUCOSE, CAPILLARY     Status: Abnormal   Collection Time   04/16/12  3:21 AM      Component Value Range Comment   Glucose-Capillary 183 (*) 70 - 99 mg/dL    Comment 1 Notify RN     GLUCOSE, CAPILLARY     Status: Normal   Collection Time   04/16/12  7:22 AM      Component Value Range Comment   Glucose-Capillary 90  70 - 99 mg/dL   GLUCOSE, CAPILLARY     Status: Abnormal   Collection Time   04/16/12 11:31 AM      Component Value Range Comment   Glucose-Capillary 142 (*) 70 - 99 mg/dL    Comment 1 Notify RN     HEPATIC FUNCTION PANEL     Status: Abnormal   Collection Time   04/16/12 12:51 PM      Component Value Range Comment   Total Protein 7.5  6.0 - 8.3 g/dL    Albumin 3.7  3.5 - 5.2 g/dL    AST 57 (*) 0 - 37 U/L    ALT 167 (*) 0 - 53 U/L    Alkaline Phosphatase 178 (*) 39 - 117 U/L    Total Bilirubin 0.2 (*) 0.3 - 1.2 mg/dL    Bilirubin, Direct <1.6  0.0 - 0.3 mg/dL    Indirect Bilirubin NOT CALCULATED  0.3 - 0.9 mg/dL   GLUCOSE, CAPILLARY     Status: Abnormal   Collection Time   04/16/12  4:21 PM      Component Value Range Comment   Glucose-Capillary 169 (*) 70 - 99 mg/dL    Comment 1 Notify RN     GLUCOSE, CAPILLARY     Status: Abnormal   Collection Time   04/16/12 10:21 PM      Component Value  Range Comment   Glucose-Capillary 209 (*) 70 - 99 mg/dL    Comment 1 Notify RN     GLUCOSE, CAPILLARY     Status: Normal   Collection Time   04/17/12  7:30 AM      Component Value Range Comment   Glucose-Capillary 75  70 - 99 mg/dL    Comment 1 Notify RN     GLUCOSE, CAPILLARY     Status: Abnormal   Collection Time   04/17/12 11:39 AM      Component Value Range Comment   Glucose-Capillary 130 (*) 70 - 99 mg/dL    Comment 1 Notify RN     GLUCOSE, CAPILLARY     Status: Normal   Collection Time   04/17/12  4:55 PM      Component Value Range Comment   Glucose-Capillary 74  70 - 99 mg/dL    Comment 1 Notify RN     GLUCOSE, CAPILLARY     Status: Abnormal   Collection Time   04/17/12  8:54 PM      Component Value Range Comment   Glucose-Capillary 272 (*) 70 - 99 mg/dL   GLUCOSE, CAPILLARY     Status: Normal   Collection Time   04/18/12  7:18 AM      Component Value Range Comment   Glucose-Capillary  80  70 - 99 mg/dL      HEENT: normal Cardio: RRR Resp: CTA B/L GI: BS positive and Distention Extremity:  No Edema Skin:   Intact Neuro: Alert/Oriented, Abnormal Sensory reduced LT on R side, Abnormal Motor 4/5 in R Delt Bi Tri Grip, 2-/5 R HE, Ankle DF/PF o/w 0/5 RLE, Abnormal FMC Ataxic/ dec FMC and Other atrophy R thigh and R Leg Musc/Skel:  Other contracture R knee flexors and R ankle Reduce R FNF   Assessment/Plan: 1. Functional deficits secondary to L thalamic infarct which require 3+ hours per day of interdisciplinary therapy in a comprehensive inpatient rehab setting. Physiatrist is providing close team supervision and 24 hour management of active medical problems listed below. Physiatrist and rehab team continue to assess barriers to discharge/monitor patient progress toward functional and medical goals.   -shoe build up for left leg delivered  -knee brace on right FIM: FIM - Bathing Bathing Steps Patient Completed: Chest;Right Arm;Left Arm;Abdomen;Front perineal  area;Buttocks;Right upper leg;Left upper leg;Left lower leg (including foot);Right lower leg (including foot) Bathing: 5: Supervision: Safety issues/verbal cues  FIM - Upper Body Dressing/Undressing Upper body dressing/undressing steps patient completed: Thread/unthread right sleeve of pullover shirt/dresss;Thread/unthread left sleeve of pullover shirt/dress;Put head through opening of pull over shirt/dress;Pull shirt over trunk Upper body dressing/undressing: 6: More than reasonable amount of time FIM - Lower Body Dressing/Undressing Lower body dressing/undressing steps patient completed: Thread/unthread right underwear leg;Thread/unthread left underwear leg;Pull underwear up/down;Thread/unthread right pants leg;Thread/unthread left pants leg;Pull pants up/down;Don/Doff right sock;Don/Doff left sock Lower body dressing/undressing: 5: Supervision: Safety issues/verbal cues  FIM - Toileting Toileting steps completed by patient: Adjust clothing prior to toileting;Adjust clothing after toileting Toileting Assistive Devices:  (RW while stand to urinate) Toileting: 4: Steadying assist  FIM - Diplomatic Services operational officer Devices: Best boy Transfers: 4-To toilet/BSC: Min A (steadying Pt. > 75%);4-From toilet/BSC: Min A (steadying Pt. > 75%)  FIM - Bed/Chair Transfer Bed/Chair Transfer Assistive Devices: Therapist, occupational: 5: Chair or W/C > Bed: Supervision (verbal cues/safety issues);5: Bed > Chair or W/C: Supervision (verbal cues/safety issues)  FIM - Locomotion: Wheelchair Distance: 150 Locomotion: Wheelchair: 5: Travels 150 ft or more: maneuvers on rugs and over door sills with supervision, cueing or coaxing FIM - Locomotion: Ambulation Locomotion: Ambulation Assistive Devices: Designer, industrial/product Ambulation/Gait Assistance: 5: Supervision Locomotion: Ambulation: 2: Travels 50 - 149 ft with supervision/safety issues  Comprehension Comprehension Mode:  Auditory Comprehension: 6-Follows complex conversation/direction: With extra time/assistive device  Expression Expression Mode: Verbal Expression: 6-Expresses complex ideas: With extra time/assistive device  Social Interaction Social Interaction: 6-Interacts appropriately with others with medication or extra time (anti-anxiety, antidepressant).  Problem Solving Problem Solving: 6-Solves complex problems: With extra time  Memory Memory: 6-More than reasonable amt of time  Problem List and Plan:  1. thrombotic left thalamus infarct. PPS 2. DVT Prophylaxis/Anticoagulation: Subcutaneous Lovenox. Monitor platelet counts any signs of bleeding  3. Neuropsych: This patient is capable of making decisions on his/her own behalf.  4. Hypertension. Norvasc 5 mg daily. Monitor with increased mobility  5. Diabetes mellitus with peripheral neuropathy. Hemoglobin A1c 7.9. Levemir 32 units twice a day. Check blood sugars a.c. and at bedtime. Continue Neurontin 800 mg 3 times a day for neuropathy   -resumed part of home metformin dose 500mg  qday --increase to bid (1000 bid at home) 6. Hyperlipidemia. Lipitor  7. BPH. Flomax/Enablex. Check PVRs x3. Check urine study. Encourage patient to stand to void 8. Scrotum;  -clean, elevate. Barrier  cream    LOS (Days) 6 A FACE TO FACE EVALUATION WAS PERFORMED  Kathelyn Gombos T 04/18/2012, 8:01 AM

## 2012-04-18 NOTE — Progress Notes (Signed)
This note has been reviewed and this clinician agrees with information provided.  

## 2012-04-18 NOTE — Progress Notes (Signed)
Physical Therapy Note  Patient Details  Name: Cote Mayabb MRN: [id number] Date of Birth: [birth year] Today's Date: 04/18/2012  Time: 900-943 43 minutes  No c/o pain. Gait training with pt wearing new shoe build up, pt reports he feels more comfortable and is able to gait >100' with supervision without foot drag.  Standing NMR with focus on wt shifts, R knee and hip control with min-mod A without AD, min A with AD.  Pt improving R LE strength and control.  Individual therapy  Caeley Dohrmann 04/18/2012, 9:44 AM

## 2012-04-18 NOTE — Progress Notes (Signed)
Physical Therapy Note  Patient Details  Name: Mike Mack MRN: [id number] Date of Birth: [birth year] Today's Date: 04/18/2012  Time: 1300-1340 40 minutes  No c/o pain.  Pt/wife educated on and safely performed stairs, gait with RW, car transfers at supervision level.  Pt/wife report they feel comfortable with these tasks and agree with PT recommendation for pt to stay at w/c level with squat pivot transfers while at home alone (pt is mod I with these) and to perform gait and standing activities with wife present to provide supervision.  Pt/wife feel safe with pt to d/c home at current level of function.  Individual therapy   DONAWERTH,KAREN 04/18/2012, 1:40 PM

## 2012-04-19 ENCOUNTER — Inpatient Hospital Stay (HOSPITAL_COMMUNITY): Payer: Medicare Other | Admitting: Physical Therapy

## 2012-04-19 ENCOUNTER — Inpatient Hospital Stay (HOSPITAL_COMMUNITY): Payer: Medicare Other | Admitting: Occupational Therapy

## 2012-04-19 ENCOUNTER — Encounter (HOSPITAL_COMMUNITY): Payer: Medicare Other | Admitting: Occupational Therapy

## 2012-04-19 DIAGNOSIS — G8110 Spastic hemiplegia affecting unspecified side: Secondary | ICD-10-CM

## 2012-04-19 DIAGNOSIS — I6330 Cerebral infarction due to thrombosis of unspecified cerebral artery: Secondary | ICD-10-CM

## 2012-04-19 LAB — GLUCOSE, CAPILLARY
Glucose-Capillary: 163 mg/dL — ABNORMAL HIGH (ref 70–99)
Glucose-Capillary: 97 mg/dL (ref 70–99)

## 2012-04-19 MED ORDER — ATORVASTATIN CALCIUM 20 MG PO TABS: 20.0000 mg | ORAL_TABLET | Freq: Every day | ORAL | Status: DC

## 2012-04-19 MED ORDER — ASPIRIN 325 MG PO TABS: 325.0000 mg | ORAL_TABLET | Freq: Every day | ORAL | Status: DC

## 2012-04-19 MED ORDER — METFORMIN HCL 500 MG PO TABS: 500.0000 mg | ORAL_TABLET | Freq: Two times a day (BID) | ORAL | Status: DC

## 2012-04-19 MED ORDER — INSULIN DETEMIR 100 UNIT/ML ~~LOC~~ SOLN: 32.0000 [IU] | Freq: Two times a day (BID) | SUBCUTANEOUS | Status: DC

## 2012-04-19 NOTE — Discharge Summary (Signed)
  Discharge summary job [id number]

## 2012-04-19 NOTE — Progress Notes (Signed)
Physical Therapy Discharge Summary  Patient Details  Name: Mike Mack MRN: [id number] Date of Birth: 1936-02-06  Today's Date: 04/19/2012  Patient has met 8 of 8 long term goals due to improved activity tolerance, improved balance, improved postural control, increased strength and ability to compensate for deficits.  Patient to discharge at a wheelchair level Modified Independent.  Pt supervision level with gait and mobility standing with RW. Patient's care partner is independent to provide the necessary supervision assistance at discharge.  Reasons goals not met: n/a  Recommendation:  Patient will benefit from ongoing skilled PT services in outpatient setting to continue to advance safe functional mobility, address ongoing impairments in balance, strength, R LE control, gait, and minimize fall risk.  Equipment: w/c, L shoe build up due to leg length discrepency, R knee brace for flex and ext control.  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   Trunk/Postural Assessment  Cervical Assessment Cervical Assessment: Within Functional Limits Thoracic Assessment Thoracic Assessment: Within Functional Limits Lumbar Assessment Lumbar Assessment: Within Functional Limits Postural Control Trunk Control: decreased trunk strength on R  Balance Static Sitting Balance Static Sitting - Level of Assistance: 6: Modified independent (Device/Increase time) Dynamic Sitting  Balance Dynamic Sitting - Balance Support: Feet supported Dynamic Sitting - Level of Assistance: 6: Modified independent (Device/Increase time) Static Standing Balance Static Standing - Balance Support: During functional activity (with 1 UE support) Static Standing - Level of Assistance: 6: Modified independent (Device/Increase time) Extremity Assessment      RLE Assessment RLE Assessment:  (hip 3-/5, ankle 3-/5, knee flex 3-/5, ext 1/5) LLE Assessment LLE Assessment: Within Functional Limits  See FIM for current functional status  DONAWERTH,KAREN 04/19/2012, 4:58 PM

## 2012-04-19 NOTE — Progress Notes (Signed)
Social Work  Discharge Note  The overall goal for the admission was met for:   Discharge location: Yes - home with wife  Length of Stay: Yes - 8 days (with 11/2 d/c)  Discharge activity level: Yes - modified independent  Home/community participation: Yes  Services provided included: MD, RD, PT, OT, RN, TR, Pharmacy and SW  Financial Services: Medicare *[employer] Medicare  Follow-up services arranged: Outpatient: PT, OT via [facility], DME: 16x16 [employer] w/c, cushion, 3n1 via [facility] and Patient/Family has no preference for HH/DME agencies  Comments (or additional information):provided info on local stroke support groups  Patient/Family verbalized understanding of follow-up arrangements: Yes  Individual responsible for coordination of the follow-up plan: patient  Confirmed correct DME delivered: Traniece Boffa 04/19/2012    Sheril Hammond

## 2012-04-19 NOTE — Progress Notes (Signed)
Orthopedic Tech Progress Note Patient Details:  Mike Mack 07-Sep-1935 [id number]  Patient ID: Mike Mack, male   DOB: [birth year], 76 y.o.   MRN: [id number]  Brace order completed by advanced Nikki Dom 04/19/2012, 7:22 PM

## 2012-04-19 NOTE — Progress Notes (Signed)
Occupational Therapy Discharge Summary & Treatment Session Note  Patient Details  Name: Mike Mack MRN: [id number] Date of Birth: 1935/08/13  Today's Date: 04/19/2012  Patient has met 10 of 10 long term goals due to improved activity tolerance, improved balance, postural control, ability to compensate for deficits and improved coordination.  Patient to discharge at overall Mod I for ADL tasks and Supervision for functional mobility with RW secondary to decreased dynamic standing balance level.  Patient's care partner is independent to provide the necessary supervision at discharge.    Reasons goals not met: NA  Recommendation:  Patient will benefit from ongoing skilled OT services in outpatient setting to continue to advance functional skills in the area of BADL.  Equipment: BSC  Reasons for discharge: treatment goals met  Patient/family agrees with progress made and goals achieved: Yes  OT Discharge Precautions/Restrictions  Precautions Precautions: Fall Restrictions Weight Bearing Restrictions: No Pain Pain Assessment Pain Assessment: No/denies pain ADL  See FIM Vision/Perception  Vision - History Baseline Vision: Wears glasses all the time Vision - Assessment Eye Alignment: Within Functional Limits Perception Perception: Within Functional Limits Praxis Praxis: Intact  Cognition Overall Cognitive Status: Appears within functional limits for tasks assessed Arousal/Alertness: Awake/alert Orientation Level: Oriented X4 Sensation Sensation Light Touch Impaired Details: Impaired RUE Coordination Gross Motor Movements are Fluid and Coordinated: Yes Fine Motor Movements are Fluid and Coordinated: Yes Motor  Motor Motor: Within Functional Limits Motor - Skilled Clinical Observations: R sided weakness LE>UE from premorbid post-polio syndrome; R UE ataxia improved Mobility  Transfers Sit to Stand: 6: Modified independent (Device/Increase time)  Trunk/Postural  Assessment  Cervical Assessment Cervical Assessment: Within Functional Limits Thoracic Assessment Thoracic Assessment: Within Functional Limits Lumbar Assessment Lumbar Assessment: Within Functional Limits Postural Control Postural Control: Within Functional Limits Trunk Control: decreased trunk strength on right side  Balance Static Sitting Balance Static Sitting - Balance Support: Feet supported;Bilateral upper extremity supported Static Sitting - Level of Assistance: 6: Modified independent (Device/Increase time) Dynamic Sitting Balance Dynamic Sitting - Balance Support: Feet supported;During functional activity;Bilateral upper extremity supported (loses balance to R if unsupported sitting) Dynamic Sitting - Level of Assistance: 6: Modified independent (Device/Increase time) Static Standing Balance Static Standing - Balance Support: During functional activity Static Standing - Level of Assistance: 6: Modified independent (Device/Increase time) Extremity/Trunk Assessment RUE Assessment RUE Assessment: Within Functional Limits LUE Assessment LUE Assessment: Within Functional Limits  See FIM for current functional status  1030-1130 No c/o pain this AM. Self care retraining with focus on family education with pt's wife "Mike Mack" during bathing, grooming, toileting and dressing routine. Bathed in therapy BR performing tub/shower transfer to shower chair using grab bars and returning to toilet for dressing tasks with wife for supervision. Educated wife on use of BSC and pt performed squat pivot transfer to w/c<>BSC with Min verbal cues for set-up of w/c and technique for transfer. Discussed extensively wife patient and wife recommendation for S anytime pt is ambulating secondary to decreased dynamic balance with functional ambulation.  [phone] No c/o pain this session. Self care retraining with focus on functional transfers, functional mobility and dynamic balance. Pt with increased  ability to perform squat pivot transfer w/c to [name] with no verbal cues for set-up of w/c and hand placement. Performed w/c mobility navigating obstacles in prep for use in home environment. Functional ambulation with RW around obstacles and performing side steps for increased dynamic balance.  Mike Mack 04/19/2012, 12:30 PM

## 2012-04-19 NOTE — Progress Notes (Signed)
Physical Therapy Note  Patient Details  Name: Mike Mack MRN: [id number] Date of Birth: March 31, 1936 Today's Date: 04/19/2012  Time: [phone] 57 minutes  No c/o pain.  Gait training with RW, pt improving distance without fatigue to 125'.  Ramp/curb training multiple attempts with close supervision, cuing for safety with negotiating RW on curb and ramp.  Stair training x 1 flight with supervision with 2 handrails.  Supine NMR for LE and core strengthening.  Pt with noted weakness in B adductors and core strength requiring manual facilitation for lower trunk rotation, adductor squeezes and bridging.  R LE PNF1 and 2 with manual facilitation, pt able to activate hamstrings and has trace contractions in R quads.  Individual therapy   Cassiopeia Florentino 04/19/2012, 10:26 AM

## 2012-04-19 NOTE — Discharge Summary (Signed)
NAMEMarland Mack  ANIBAL, QUINBY NO.:  [id number]  MEDICAL RECORD NO.:  [id number]  LOCATION:  4030                         FACILITY:  [facility]  PHYSICIAN:  Ranelle Oyster, M.D.DATE OF BIRTH:  Sep 07, 1935  DATE OF ADMISSION:  04/12/2012 DATE OF DISCHARGE:  04/20/2012                              DISCHARGE SUMMARY   DISCHARGE DIAGNOSES: 1. Thrombotic left thalamus infarction. 2. Subcutaneous Lovenox for deep venous thrombosis prophylaxis,     hypertension, diabetes mellitus, hyperlipidemia, and benign     prostatic hypertrophy.  This is a 76 year old right-handed male with history of diabetes mellitus with peripheral neuropathy, hypertension, and remote polio as a child.  The patient independent prior to admission.  Admitted April 10, 2012 with right-sided weakness over a 2-day period.  MRI of the brain showed acute nonhemorrhagic 10 mm infarct of the left thalamus as well as multiple remote lacunar infarcts of the cerebellum more prominently on the left.  Echocardiogram with ejection fraction of 70% without wall motion abnormalities.  Carotid Dopplers with no ICA stenosis.  MRA of the head with no significant stenosis, aneurysm, or occlusion.  Neurology Service was consulted, placed on aspirin therapy as well as subcutaneous Lovenox for DVT prophylaxis.  Noted hemoglobin A1c of 7.9 with insulin therapy as directed.  The patient was admitted for a comprehensive rehab program.  PAST MEDICAL HISTORY:  See discharge diagnoses.  SOCIAL HISTORY:  Lives with family, assistance as needed.  Functional status upon admission to rehab services was minimum to moderate assist for functional mobility.  Noted the patient was independent prior to admission.  PHYSICAL EXAMINATION:  VITAL SIGNS:  Blood pressure 155/80, pulse 77, temperature 98.2, respirations 18. GENERAL:  This was an alert male, in no acute distress, oriented x3. Well developed.  Speech was fluent without evidence  of aphasia. LUNGS:  Clear to auscultation. CARDIAC:  Regular rate and rhythm. ABDOMEN:  Soft, nontender.  Good bowel sounds.  REHABILITATION HOSPITAL COURSE:  The patient was admitted to inpatient rehab services with therapies initiated on a 3-hour daily basis consisting of physical therapy, occupational therapy, and rehabilitation nursing.  The following issues were addressed during the patient's rehabilitation stay.  Pertaining to [name] thrombotic left thalamus infarction remained stable, maintained on aspirin therapy.  The patient would follow up with Outpatient Neurology Services. Subcutaneous Lovenox ongoing for DVT prophylaxis.  Blood pressure is well controlled on Norvasc with no orthostatic changes.  He did have a history of diabetes mellitus with peripheral neuropathy.  Hemoglobin A1c of 7.9.  He remained on insulin therapy with Levemir as well as Glucophage.  He will continue his Lipitor for hyperlipidemia.  He did have a history of benign prostatic hypertrophy.  He remained on Flomax and is voiding without difficulty.  The patient received weekly collaborative interdisciplinary team conferences to discuss estimated length of stay, family teaching, and any barriers to discharge.  He was continent of bowel and bladder, minimal assist overall for activities of daily living, minimal assist supervision for mobility.  He exhibited no unsafe behavior.  Strength and endurance continued to improve throughout his rehab course and he was encouraged with overall progress.  Full family teaching was completed with  his wife and plans to be discharged to home on April 20, 2012.  DISCHARGE MEDICATIONS:  At the time of dictation included: 1. Norvasc 5 mg p.o. daily. 2. Aspirin 325 mg p.o. daily. 3. Lipitor 20 mg p.o. daily. 4. Neurontin 800 mg p.o. t.i.d. 5. Levemir 32 units subcutaneously twice daily. 6. Glucophage 500 mg b.i.d. 7. Senokot tablets 2 p.o. daily, hold for loose  stool. 8. Flomax 0.4 mg p.o. daily.  DIET:  Diabetic diet.  He would follow up with Dr. Faith Rogue at the outpatient rehab service office May 03, 2012; Dr. Delia Heady, Neurology Services, call for appointment 1 month, Dr. Darci Needle, medical management, appointment to be made.  Ongoing therapies were dictated as per [employer].     Mariam Dollar, P.A.   ______________________________ Ranelle Oyster, M.D.    DA/MEDQ  D:  04/19/2012  T:  04/19/2012  Job:  [id number]  cc:   Ranelle Oyster, M.D. Pramod P. Pearlean Brownie, MD Brooke Bonito, M.D.

## 2012-04-19 NOTE — Progress Notes (Signed)
This note has been reviewed and this clinician agrees with information provided.  

## 2012-04-19 NOTE — Progress Notes (Signed)
Physical Therapy Session Note  Patient Details  Name: Mike Mack MRN: [id number] Date of Birth: 03-Dec-1935  Today's Date: 04/19/2012 Time: 1300-1330 Time Calculation (min): 30 min  Short Term Goals: Week 1:     Skilled Therapeutic Interventions/Progress Updates:    Session focused on orthotics fit assessment of knee immobilizer and potential adjustments or other brace options with prosthetist. Pt ambulated multiple bouts throughout session with supervision while testing orthotic comfort and fit. Discussed findings with primary therapist.   Therapy Documentation Precautions:  Precautions Precautions: Fall Precaution Comments: Post polio syndrome with Right LE atrophy prior to stroke resulting in Right side weakness. Restrictions Weight Bearing Restrictions: No Pain:  No c/o pain  See FIM for current functional status  Therapy/Group: Individual Therapy  Wilhemina Bonito 04/19/2012, 5:11 PM

## 2012-04-19 NOTE — Progress Notes (Signed)
Patient ID: Mike Mack, male   DOB: 21-Nov-1935, 76 y.o.   MRN: [id number]    Subjective/Complaints: Scrotum feels much better No new complaints  Review of Systems  Gastrointestinal: Positive for constipation.  Genitourinary: Positive for frequency.  All other systems reviewed and are negative.     Objective: Vital Signs: Blood pressure 107/62, pulse 66, temperature 98.1 F (36.7 C), temperature source Oral, resp. rate 18, height 5\' 5"  (1.651 m), weight 65.4 kg (144 lb 2.9 oz), SpO2 99.00%. No results found. Results for orders placed during the hospital encounter of 04/12/12 (from the past 72 hour(s))  GLUCOSE, CAPILLARY     Status: Abnormal   Collection Time   04/16/12 11:31 AM      Component Value Range Comment   Glucose-Capillary 142 (*) 70 - 99 mg/dL    Comment 1 Notify RN     HEPATIC FUNCTION PANEL     Status: Abnormal   Collection Time   04/16/12 12:51 PM      Component Value Range Comment   Total Protein 7.5  6.0 - 8.3 g/dL    Albumin 3.7  3.5 - 5.2 g/dL    AST 57 (*) 0 - 37 U/L    ALT 167 (*) 0 - 53 U/L    Alkaline Phosphatase 178 (*) 39 - 117 U/L    Total Bilirubin 0.2 (*) 0.3 - 1.2 mg/dL    Bilirubin, Direct <7.8  0.0 - 0.3 mg/dL    Indirect Bilirubin NOT CALCULATED  0.3 - 0.9 mg/dL   GLUCOSE, CAPILLARY     Status: Abnormal   Collection Time   04/16/12  4:21 PM      Component Value Range Comment   Glucose-Capillary 169 (*) 70 - 99 mg/dL    Comment 1 Notify RN     GLUCOSE, CAPILLARY     Status: Abnormal   Collection Time   04/16/12 10:21 PM      Component Value Range Comment   Glucose-Capillary 209 (*) 70 - 99 mg/dL    Comment 1 Notify RN     GLUCOSE, CAPILLARY     Status: Normal   Collection Time   04/17/12  7:30 AM      Component Value Range Comment   Glucose-Capillary 75  70 - 99 mg/dL    Comment 1 Notify RN     GLUCOSE, CAPILLARY     Status: Abnormal   Collection Time   04/17/12 11:39 AM      Component Value Range Comment   Glucose-Capillary  130 (*) 70 - 99 mg/dL    Comment 1 Notify RN     GLUCOSE, CAPILLARY     Status: Normal   Collection Time   04/17/12  4:55 PM      Component Value Range Comment   Glucose-Capillary 74  70 - 99 mg/dL    Comment 1 Notify RN     GLUCOSE, CAPILLARY     Status: Abnormal   Collection Time   04/17/12  8:54 PM      Component Value Range Comment   Glucose-Capillary 272 (*) 70 - 99 mg/dL   GLUCOSE, CAPILLARY     Status: Normal   Collection Time   04/18/12  7:18 AM      Component Value Range Comment   Glucose-Capillary 80  70 - 99 mg/dL   GLUCOSE, CAPILLARY     Status: Normal   Collection Time   04/18/12 11:18 AM      Component Value Range Comment  Glucose-Capillary 98  70 - 99 mg/dL    Comment 1 Notify RN     GLUCOSE, CAPILLARY     Status: Abnormal   Collection Time   04/18/12  4:32 PM      Component Value Range Comment   Glucose-Capillary 172 (*) 70 - 99 mg/dL    Comment 1 Notify RN     GLUCOSE, CAPILLARY     Status: Normal   Collection Time   04/18/12  9:12 PM      Component Value Range Comment   Glucose-Capillary 91  70 - 99 mg/dL    Comment 1 Notify RN        HEENT: normal Cardio: RRR Resp: CTA B/L GI: BS positive and Distention Extremity:  No Edema Skin:   Intact Neuro: Alert/Oriented, Abnormal Sensory reduced LT on R side, Abnormal Motor 4/5 in R Delt Bi Tri Grip, 2-/5 R HE, Ankle DF/PF o/w 0/5 RLE, Abnormal FMC Ataxic/ dec FMC and Other atrophy R thigh and R Leg Musc/Skel:  Other contracture R knee flexors and R ankle Reduce R FNF scrotal area less irritated  Assessment/Plan: 1. Functional deficits secondary to L thalamic infarct which require 3+ hours per day of interdisciplinary therapy in a comprehensive inpatient rehab setting. Physiatrist is providing close team supervision and 24 hour management of active medical problems listed below. Physiatrist and rehab team continue to assess barriers to discharge/monitor patient progress toward functional and medical  goals.   -shoe build up for left leg delivered  -knee brace on right pending FIM: FIM - Bathing Bathing Steps Patient Completed: Chest;Right Arm;Left Arm;Abdomen;Front perineal area;Buttocks;Right upper leg;Left upper leg;Right lower leg (including foot);Left lower leg (including foot) Bathing: 6: Assistive device (Comment)  FIM - Upper Body Dressing/Undressing Upper body dressing/undressing steps patient completed: Thread/unthread right sleeve of pullover shirt/dresss;Thread/unthread left sleeve of pullover shirt/dress;Put head through opening of pull over shirt/dress;Pull shirt over trunk Upper body dressing/undressing: 6: Assistive device (Comment) FIM - Lower Body Dressing/Undressing Lower body dressing/undressing steps patient completed: Thread/unthread right underwear leg;Thread/unthread left underwear leg;Pull underwear up/down;Thread/unthread right pants leg;Thread/unthread left pants leg;Pull pants up/down;Fasten/unfasten pants;Don/Doff right sock;Don/Doff left sock;Don/Doff right shoe;Don/Doff left shoe;Fasten/unfasten right shoe;Fasten/unfasten left shoe Lower body dressing/undressing: 6: Assistive device (Comment)  FIM - Toileting Toileting steps completed by patient: Adjust clothing prior to toileting;Adjust clothing after toileting Toileting Assistive Devices:  (RW while stand to urinate) Toileting: 4: Steadying assist  FIM - Diplomatic Services operational officer Devices: Psychiatrist Transfers: 6-Assistive device: No helper  FIM - Banker Devices: Therapist, occupational: 5: Bed > Chair or W/C: Supervision (verbal cues/safety issues);5: Chair or W/C > Bed: Supervision (verbal cues/safety issues)  FIM - Locomotion: Wheelchair Distance: 150 Locomotion: Wheelchair: 5: Travels 150 ft or more: maneuvers on rugs and over door sills with supervision, cueing or coaxing FIM - Locomotion: Ambulation Locomotion: Ambulation  Assistive Devices: Designer, industrial/product Ambulation/Gait Assistance: 5: Supervision Locomotion: Ambulation: 2: Travels 50 - 149 ft with supervision/safety issues  Comprehension Comprehension Mode: Auditory Comprehension: 6-Follows complex conversation/direction: With extra time/assistive device  Expression Expression Mode: Verbal Expression: 6-Expresses complex ideas: With extra time/assistive device  Social Interaction Social Interaction: 6-Interacts appropriately with others with medication or extra time (anti-anxiety, antidepressant).  Problem Solving Problem Solving: 6-Solves complex problems: With extra time  Memory Memory: 6-More than reasonable amt of time  Problem List and Plan:  1. thrombotic left thalamus infarct. PPS 2. DVT Prophylaxis/Anticoagulation: Subcutaneous Lovenox. Monitor platelet counts any signs of  bleeding  3. Neuropsych: This patient is capable of making decisions on his/her own behalf.  4. Hypertension. Norvasc 5 mg daily. Monitor with increased mobility  5. Diabetes mellitus with peripheral neuropathy. Hemoglobin A1c 7.9. Levemir 32 units twice a day. Check blood sugars a.c. and at bedtime. Continue Neurontin 800 mg 3 times a day for neuropathy   -resumed part of home metformin dose 500mg  qday --increased to bid (1000 bid at home)--further titrate at home 6. Hyperlipidemia. Lipitor  7. BPH. Flomax/Enablex. Check PVRs x3. Check urine study. Encourage patient to stand to void 8. Scrotum;  -continue to keep clean, elevate. Barrier cream    LOS (Days) 7 A FACE TO FACE EVALUATION WAS PERFORMED  SWARTZ,ZACHARY T 04/19/2012, 7:48 AM

## 2012-04-20 NOTE — Progress Notes (Signed)
Patient discharged approximately 0920 with all belongings and family. Went over [employer] instructions with patient again and patient able to teach back most information. Gave patient another copy of discharge instructions with the doctors appointments. Patient had toilet and wheelchair from advanced at time of D/C. Patient denied any other questions . Patient discharged home. Vital signs stable. Patient blood sugar back up with breakfast. No complaints noted.

## 2012-04-20 NOTE — Progress Notes (Signed)
Hypoglycemic Event  CBG: 69 Treatment: 15 GM carbohydrate snack  Symptoms: None  Follow-up CBG: [id number] CBG Result:108  Possible Reasons for Event: Unknown  Comments/MD notified:Dr Riley Kill notified. No new orders received.     Cian Costanzo, Sylvie Farrier  Remember to initiate Hypoglycemia Order Set & complete

## 2012-04-20 NOTE — Progress Notes (Signed)
Patient ID: Mike Mack, male   DOB: 02-05-1936, 76 y.o.   MRN: [id number]    Subjective/Complaints: Scrotum feels much better, some tingling in fingers. Excited to go home today No new complaints  Review of Systems  Gastrointestinal: Positive for constipation.  Genitourinary: Positive for frequency.  All other systems reviewed and are negative.     Objective: Vital Signs: Blood pressure 134/77, pulse 71, temperature 98.2 F (36.8 C), temperature source Oral, resp. rate 19, height 5\' 5"  (1.651 m), weight 65.4 kg (144 lb 2.9 oz), SpO2 99.00%. No results found. Results for orders placed during the hospital encounter of 04/12/12 (from the past 72 hour(s))  GLUCOSE, CAPILLARY     Status: Normal   Collection Time   04/17/12  7:30 AM      Component Value Range Comment   Glucose-Capillary 75  70 - 99 mg/dL    Comment 1 Notify RN     GLUCOSE, CAPILLARY     Status: Abnormal   Collection Time   04/17/12 11:39 AM      Component Value Range Comment   Glucose-Capillary 130 (*) 70 - 99 mg/dL    Comment 1 Notify RN     GLUCOSE, CAPILLARY     Status: Normal   Collection Time   04/17/12  4:55 PM      Component Value Range Comment   Glucose-Capillary 74  70 - 99 mg/dL    Comment 1 Notify RN     GLUCOSE, CAPILLARY     Status: Abnormal   Collection Time   04/17/12  8:54 PM      Component Value Range Comment   Glucose-Capillary 272 (*) 70 - 99 mg/dL   GLUCOSE, CAPILLARY     Status: Normal   Collection Time   04/18/12  7:18 AM      Component Value Range Comment   Glucose-Capillary 80  70 - 99 mg/dL   GLUCOSE, CAPILLARY     Status: Normal   Collection Time   04/18/12 11:18 AM      Component Value Range Comment   Glucose-Capillary 98  70 - 99 mg/dL    Comment 1 Notify RN     GLUCOSE, CAPILLARY     Status: Abnormal   Collection Time   04/18/12  4:32 PM      Component Value Range Comment   Glucose-Capillary 172 (*) 70 - 99 mg/dL    Comment 1 Notify RN     GLUCOSE, CAPILLARY      Status: Normal   Collection Time   04/18/12  9:12 PM      Component Value Range Comment   Glucose-Capillary 91  70 - 99 mg/dL    Comment 1 Notify RN     GLUCOSE, CAPILLARY     Status: Abnormal   Collection Time   04/19/12  8:17 AM      Component Value Range Comment   Glucose-Capillary 163 (*) 70 - 99 mg/dL   GLUCOSE, CAPILLARY     Status: Normal   Collection Time   04/19/12 12:07 PM      Component Value Range Comment   Glucose-Capillary 97  70 - 99 mg/dL   GLUCOSE, CAPILLARY     Status: Abnormal   Collection Time   04/19/12  4:47 PM      Component Value Range Comment   Glucose-Capillary 206 (*) 70 - 99 mg/dL    Comment 1 Notify RN     GLUCOSE, CAPILLARY     Status: Abnormal  Collection Time   04/19/12  8:46 PM      Component Value Range Comment   Glucose-Capillary 173 (*) 70 - 99 mg/dL      HEENT: normal Cardio: RRR Resp: CTA B/L GI: BS positive and Distention Extremity:  No Edema Skin:   Intact Neuro: Alert/Oriented, Abnormal Sensory reduced LT on R side, Abnormal Motor 4/5 in R Delt Bi Tri Grip, 2-/5 R HE, Ankle DF/PF o/w 0/5 RLE, Abnormal FMC Ataxic/ dec FMC and Other atrophy R thigh and R Leg Musc/Skel:  Other contracture R knee flexors and R ankle Reduce R FNF scrotal area less irritated  Assessment/Plan: 1. Functional deficits secondary to L thalamic infarct which require 3+ hours per day of interdisciplinary therapy in a comprehensive inpatient rehab setting. Physiatrist is providing close team supervision and 24 hour management of active medical problems listed below. Physiatrist and rehab team continue to assess barriers to discharge/monitor patient progress toward functional and medical goals.   -shoe build up for left leg delivered  -knee brace on right pending FIM: FIM - Bathing Bathing Steps Patient Completed: Chest;Right Arm;Left Arm;Abdomen;Front perineal area;Buttocks;Right upper leg;Left upper leg;Right lower leg (including foot);Left lower leg  (including foot) Bathing: 6: Assistive device (Comment)  FIM - Upper Body Dressing/Undressing Upper body dressing/undressing steps patient completed: Thread/unthread right sleeve of pullover shirt/dresss;Thread/unthread left sleeve of pullover shirt/dress;Put head through opening of pull over shirt/dress;Pull shirt over trunk Upper body dressing/undressing: 6: Assistive device (Comment) FIM - Lower Body Dressing/Undressing Lower body dressing/undressing steps patient completed: Thread/unthread right underwear leg;Thread/unthread left underwear leg;Pull underwear up/down;Thread/unthread right pants leg;Thread/unthread left pants leg;Pull pants up/down;Fasten/unfasten pants;Don/Doff right sock;Don/Doff left sock;Don/Doff right shoe;Don/Doff left shoe;Fasten/unfasten right shoe;Fasten/unfasten left shoe Lower body dressing/undressing: 6: Assistive device (Comment)  FIM - Toileting Toileting steps completed by patient: Adjust clothing prior to toileting;Performs perineal hygiene;Adjust clothing after toileting Toileting Assistive Devices:  (RW while stand to urinate) Toileting: 6: Assistive device: No helper  FIM - Diplomatic Services operational officer Devices: Psychiatrist Transfers: 6-Assistive device: No helper  FIM - Banker Devices: Therapist, occupational: 5: Bed > Chair or W/C: Supervision (verbal cues/safety issues);5: Chair or W/C > Bed: Supervision (verbal cues/safety issues)  FIM - Locomotion: Wheelchair Distance: 150 Locomotion: Wheelchair: 6: Travels 150 ft or more, turns around, maneuvers to table, bed or toilet, negotiates 3% grade: maneuvers on rugs and over door sills independently FIM - Locomotion: Ambulation Locomotion: Ambulation Assistive Devices: Designer, industrial/product Ambulation/Gait Assistance: 5: Supervision Locomotion: Ambulation: 2: Travels 50 - 149 ft with supervision/safety issues  Comprehension Comprehension  Mode: Auditory Comprehension: 6-Follows complex conversation/direction: With extra time/assistive device  Expression Expression Mode: Verbal Expression: 6-Expresses complex ideas: With extra time/assistive device  Social Interaction Social Interaction: 6-Interacts appropriately with others with medication or extra time (anti-anxiety, antidepressant).  Problem Solving Problem Solving: 6-Solves complex problems: With extra time  Memory Memory: 6-More than reasonable amt of time  Problem List and Plan:  1. thrombotic left thalamus infarct. PPS 2. DVT Prophylaxis/Anticoagulation: Subcutaneous Lovenox. Monitor platelet counts any signs of bleeding  3. Neuropsych: This patient is capable of making decisions on his/her own behalf.  4. Hypertension. Norvasc 5 mg daily. Monitor with increased mobility  5. Diabetes mellitus with peripheral neuropathy. Hemoglobin A1c 7.9. Levemir 32 units twice a day. Check blood sugars a.c. and at bedtime. Continue Neurontin 800 mg 3 times a day for neuropathy   -resumed part of home metformin dose 500mg  qday --increased to bid (1000  bid at home)--further titrate at home  -pt educated that tingling in fingers is from the neuropathy 6. Hyperlipidemia. Lipitor  7. BPH. Flomax/Enablex. Check PVRs x3. Check urine study. Encourage patient to stand to void 8. Scrotum;  -continue to keep clean, elevate, dry    LOS (Days) 8 A FACE TO FACE EVALUATION WAS PERFORMED  Mike Mack 04/20/2012, 6:57 AM

## 2012-04-22 ENCOUNTER — Ambulatory Visit
Payer: Medicare Other | Attending: Physical Medicine & Rehabilitation | Admitting: Rehabilitative and Restorative Service Providers"

## 2012-04-22 DIAGNOSIS — I69998 Other sequelae following unspecified cerebrovascular disease: Secondary | ICD-10-CM | POA: Insufficient documentation

## 2012-04-22 DIAGNOSIS — Z5189 Encounter for other specified aftercare: Secondary | ICD-10-CM | POA: Insufficient documentation

## 2012-04-22 DIAGNOSIS — R279 Unspecified lack of coordination: Secondary | ICD-10-CM | POA: Insufficient documentation

## 2012-04-22 LAB — GLUCOSE, CAPILLARY

## 2012-04-24 ENCOUNTER — Ambulatory Visit: Payer: Medicare Other | Admitting: Occupational Therapy

## 2012-04-25 ENCOUNTER — Ambulatory Visit: Payer: Medicare Other | Admitting: Occupational Therapy

## 2012-04-25 ENCOUNTER — Ambulatory Visit: Payer: Medicare Other | Admitting: Rehabilitative and Restorative Service Providers"

## 2012-05-02 ENCOUNTER — Ambulatory Visit: Payer: Medicare Other | Admitting: Occupational Therapy

## 2012-05-03 ENCOUNTER — Ambulatory Visit: Payer: Medicare Other | Admitting: Rehabilitative and Restorative Service Providers"

## 2012-05-09 ENCOUNTER — Ambulatory Visit: Payer: Medicare Other | Admitting: Physical Therapy

## 2012-05-13 ENCOUNTER — Inpatient Hospital Stay: Payer: Medicare Other | Admitting: Physical Medicine & Rehabilitation

## 2012-05-20 ENCOUNTER — Encounter: Payer: Medicare Other | Admitting: Occupational Therapy

## 2012-05-20 ENCOUNTER — Ambulatory Visit: Payer: Medicare Other | Admitting: Rehabilitative and Restorative Service Providers"

## 2012-05-23 ENCOUNTER — Ambulatory Visit: Payer: Medicare Other | Admitting: Occupational Therapy

## 2012-05-23 ENCOUNTER — Ambulatory Visit
Payer: Medicare Other | Attending: Physical Medicine & Rehabilitation | Admitting: Rehabilitative and Restorative Service Providers"

## 2012-05-23 DIAGNOSIS — I69998 Other sequelae following unspecified cerebrovascular disease: Secondary | ICD-10-CM | POA: Insufficient documentation

## 2012-05-23 DIAGNOSIS — Z5189 Encounter for other specified aftercare: Secondary | ICD-10-CM | POA: Insufficient documentation

## 2012-05-23 DIAGNOSIS — R279 Unspecified lack of coordination: Secondary | ICD-10-CM | POA: Insufficient documentation

## 2012-05-24 ENCOUNTER — Encounter: Payer: Medicare Other | Admitting: Physical Medicine & Rehabilitation

## 2012-05-24 ENCOUNTER — Ambulatory Visit: Payer: Medicare Other | Admitting: Rehabilitative and Restorative Service Providers"

## 2012-05-30 ENCOUNTER — Ambulatory Visit: Payer: Medicare Other | Admitting: Occupational Therapy

## 2012-05-30 ENCOUNTER — Ambulatory Visit: Payer: Medicare Other | Admitting: Rehabilitative and Restorative Service Providers"

## 2012-05-31 ENCOUNTER — Ambulatory Visit: Payer: Medicare Other | Admitting: Rehabilitative and Restorative Service Providers"

## 2012-06-06 ENCOUNTER — Ambulatory Visit: Payer: Medicare Other | Admitting: Rehabilitative and Restorative Service Providers"

## 2012-06-06 ENCOUNTER — Ambulatory Visit: Payer: Medicare Other | Admitting: Occupational Therapy

## 2012-06-07 ENCOUNTER — Ambulatory Visit: Payer: Medicare Other | Admitting: Occupational Therapy

## 2012-06-07 ENCOUNTER — Ambulatory Visit: Payer: Medicare Other | Admitting: Rehabilitative and Restorative Service Providers"

## 2012-06-13 ENCOUNTER — Encounter: Payer: Medicare Other | Admitting: Occupational Therapy

## 2012-06-13 ENCOUNTER — Ambulatory Visit: Payer: Medicare Other | Admitting: Rehabilitative and Restorative Service Providers"

## 2012-06-14 ENCOUNTER — Ambulatory Visit: Payer: Medicare Other | Admitting: Rehabilitative and Restorative Service Providers"

## 2012-06-14 ENCOUNTER — Encounter: Payer: Medicare Other | Admitting: Occupational Therapy

## 2012-06-18 ENCOUNTER — Other Ambulatory Visit: Payer: Self-pay

## 2012-06-19 DIAGNOSIS — I639 Cerebral infarction, unspecified: Secondary | ICD-10-CM

## 2012-06-19 HISTORY — DX: Cerebral infarction, unspecified: I63.9

## 2012-07-05 ENCOUNTER — Encounter: Payer: Self-pay | Admitting: Physical Medicine & Rehabilitation

## 2012-07-05 ENCOUNTER — Encounter: Payer: Medicare Other | Attending: Physical Medicine & Rehabilitation | Admitting: Physical Medicine & Rehabilitation

## 2012-07-05 VITALS — BP 142/77 | HR 74 | Resp 14 | Ht 65.0 in | Wt 151.0 lb

## 2012-07-05 DIAGNOSIS — E119 Type 2 diabetes mellitus without complications: Secondary | ICD-10-CM | POA: Insufficient documentation

## 2012-07-05 DIAGNOSIS — I10 Essential (primary) hypertension: Secondary | ICD-10-CM | POA: Insufficient documentation

## 2012-07-05 DIAGNOSIS — R209 Unspecified disturbances of skin sensation: Secondary | ICD-10-CM | POA: Insufficient documentation

## 2012-07-05 DIAGNOSIS — R29898 Other symptoms and signs involving the musculoskeletal system: Secondary | ICD-10-CM | POA: Insufficient documentation

## 2012-07-05 DIAGNOSIS — I6350 Cerebral infarction due to unspecified occlusion or stenosis of unspecified cerebral artery: Secondary | ICD-10-CM | POA: Insufficient documentation

## 2012-07-05 DIAGNOSIS — M2170 Unequal limb length (acquired), unspecified site: Secondary | ICD-10-CM | POA: Insufficient documentation

## 2012-07-05 DIAGNOSIS — B91 Sequelae of poliomyelitis: Secondary | ICD-10-CM

## 2012-07-05 DIAGNOSIS — G14 Postpolio syndrome: Secondary | ICD-10-CM

## 2012-07-05 DIAGNOSIS — I639 Cerebral infarction, unspecified: Secondary | ICD-10-CM

## 2012-07-05 DIAGNOSIS — L299 Pruritus, unspecified: Secondary | ICD-10-CM | POA: Insufficient documentation

## 2012-07-05 NOTE — Progress Notes (Signed)
Subjective:    Patient ID: Mike Mack, male    DOB: [birth year], 77 y.o.   MRN: [id number]  HPI  Mike Mack is back regarding his left thalamic infarction. He is walking regularly. He notes tingling still in his right hand. Sometimes he drops things with his right hand. This hasn't changed since the stroke. He also complains of itching in his back at night. There is no associated rash. Overall, though, he is doing well. He has been discharged from therapies.   His pain is minimal at this point.  His left shoe build up has been quite helpful with gait and with back pain. He has chronic right leg weakness related to his PPS.   Pain Inventory Average Pain n/a Pain Right Now n/a My pain is tingling  In the last 24 hours, has pain interfered with the following? General activity 3 Relation with others 2 Enjoyment of life 2 What TIME of day is your pain at its worst? daytime Sleep (in general) Good  Pain is worse with: some activites Pain improves with: pacing activities Relief from Meds: 0  Mobility use a cane use a walker how many minutes can you walk? 30 ability to climb steps?  yes do you drive?  yes transfers alone  Function not employed: date last employed  I need assistance with the following:  household duties  Neuro/Psych bladder control problems  Prior Studies Any changes since last visit?  no  Physicians involved in your care Primary care    Family History  Problem Relation Age of Onset  . Diabetes Sister   . Diabetes Sister   . Diabetes Brother   . Hypertension Sister   . Hypertension Sister   . Hypertension Brother   . Heart failure Mother   . Heart attack Sister    History   Social History  . Marital Status: Married    Spouse Name: [name]    Number of Children: 2  . Years of Education: N/A   Occupational History  . Retired, Education administrator    Social History Main Topics  . Smoking status: Never Smoker   . Smokeless tobacco:  Never Used  . Alcohol Use: No  . Drug Use: No  . Sexually Active: No   Other Topics Concern  . None   Social History Narrative   Married.  Lives in [place] with wife.  Ambulates with a cane.   Past Surgical History  Procedure Date  . Bowel resection   . Hernia repair   . Cataract extraction w/ intraocular lens  implant, bilateral    Past Medical History  Diagnosis Date  . Diabetes mellitus without complication   . Hypertension   . Small bowel obstruction   . BPH (benign prostatic hyperplasia)   . Neuropathy   . Stroke    BP 142/77  Pulse 74  Resp 14  Ht 5\' 5"  (1.651 m)  Wt 151 lb (68.493 kg)  BMI 25.13 kg/m2  SpO2 99%     Review of Systems  Cardiovascular: Positive for leg swelling.  Gastrointestinal: Positive for constipation.  Musculoskeletal: Positive for gait problem.  All other systems reviewed and are negative.       Objective:   Physical Exam  General: Alert and oriented x 3, No apparent distress HEENT: Head is normocephalic, atraumatic, PERRLA, EOMI, sclera anicteric, oral mucosa pink and moist, dentition intact, ext ear canals clear,  Neck: Supple without JVD or lymphadenopathy Heart: Reg rate and rhythm. No murmurs rubs or  gallops Chest: CTA bilaterally without wheezes, rales, or rhonchi; no distress Abdomen: Soft, non-tender, non-distended, bowel sounds positive. Extremities: No clubbing, cyanosis, or edema. Pulses are 2+ Skin: Clean and intact without signs of breakdown Neuro: Pt is cognitively appropriate with normal insight, memory, and awareness. Cranial nerves 2-12 are intact. Sensory exam is diminished in the right hand. Strength is 4/5 RUE, and 2/5 RLE prox to 1-2 with adf and apf. Pt walks with circumduction of the right leg and recurvatum at the knee.. Reflexes are 1+ in all 4's. Fine motor coordination is intact. No tremors. Musculoskeletal: full PROM.  Psych: Pt's affect is appropriate. Pt is cooperative         Assessment &  Plan:  1. Thrombotic left thalamus infarction. 2. Left leg length discrepancy 3. 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 HTN and DM II  Plan: 1. Continue aerobic exercise and strengthening of right leg. i think a stationary bike or pool would be ideal 2. Discussed knee brace when exercising to help better control the knee.  3. BP and DM control per Dr. Juleen China 4. If the tingling in his right hand increases we can perform nerve testing. I suspect, however, that this is stroke related, and I see no signs of change since his rehab stay.  5. Follow up with me as needed. 30 minutes of face to face patient care time were spent during this visit. All questions were encouraged and answered.  4.

## 2012-07-05 NOTE — Patient Instructions (Addendum)
Consider exercise with a stationary bike or in the water. Use your knee brace when you exercise

## 2012-09-12 ENCOUNTER — Ambulatory Visit (INDEPENDENT_AMBULATORY_CARE_PROVIDER_SITE_OTHER): Payer: Medicare Other | Admitting: Neurology

## 2012-09-12 ENCOUNTER — Encounter: Payer: Self-pay | Admitting: Neurology

## 2012-09-12 VITALS — BP 135/70 | HR 80 | Temp 97.8°F | Ht 65.0 in | Wt 141.0 lb

## 2012-09-12 DIAGNOSIS — I6350 Cerebral infarction due to unspecified occlusion or stenosis of unspecified cerebral artery: Secondary | ICD-10-CM

## 2012-09-12 NOTE — Progress Notes (Signed)
HPI: Mike Mack is a 77 her Caucasian male seen for first office follow for the following hospital consultation for stroke in October 2013. He presented on 04/10/12 with worsening of his pre-existing right lower extremely weakness from remote polio for her today duration along with right hand clumsiness tingling and numbness. He denied any headache facial weakness drooling speech difficulties. CT head showed findings history of normal pressure hydrocephalus. MRI showed acute left thalamic lacunar infarct , remote age bilateral cerebellar lacunar infarcts and and confirmed communicating hydrocephalus. Carotid Dopplers and transthoracic echo were unremarkable Vascular risk factors identified included diabetes and hypertension. He was started on aspirin for stroke prevention and arises aggressive treatment for blood pressure and diabetes. He noted improvement in his weakness and had mild paresthesias in his hand. He  has finished physical outpatient therapy. Hie still  has residual tingling in his fingertips of her right hand and his right leg weakness is at his baseline. He still feels slightly weaker in his right hand. ROS: 14 system review of systems is positive for itching, constipation, weakness, tingling. Physical Exam General: well developed, well nourished, seated, in no evident distress Head: head normocephalic and atraumatic. Orohparynx benign Neck: supple with no carotid or supraclavicular bruits Cardiovascular: regular rate and rhythm, no murmurs  musculoskeletal  Deformity the right leg with wasting. Left leg is shorter due to 2 prior surgeries for polio. Skin no rash Neurologic Exam Mental Status: Awake and fully alert. Oriented to place and time. Recent and remote memory intact. Attention span, concentration and fund of knowledge appropriate. Mood and affect appropriate.  Cranial Nerves: Fundoscopic exam reveals sharp disc margins. Pupils equal, briskly reactive to light. Extraocular  movements full without nystagmus. Visual fields full to confrontation. Hearing intact and symmetric to finer snap. Facial sensation intact. Face, tongue, palate move normally and symmetrically. Neck flexion and extension normal.  Motor: Diminished  bulk and tone in right leg from remote polio. Weakness of the right thigh and leg grade 3/5 with right foot drop and ankle dorsiflexor weakness. Sensory.: intact to tough and pinprick and vibratory. Marland Kitchen subjective tingling in the right hand spot no objective sensory loss Coordination: Rapid alternating movements normal in all extremities. Finger-to-nose and heel-to-shin performed accurately bilaterally. Gait and Station: Arises from chair without difficulty. Gait is unbalanced with flaring of the weak right leg. Left foot has special shoes to compensate for shot length of that leg Reflexes: 1+ and symmetric except ankle jerks which are depressed. Toes downgoing.     ASSESSMENT::  77 year old Caucasian male with left thalamic lacunar infarct in October 2013 due to small vessel disease with vascular risk factors of diabetes and Hypertension. Remote history of poliomyelitis in lower extremities right greater than left   PLAN: Continue aspirin for stroke prevention with strict control of hypertension with blood pressure goal below 130/90 and l diabetes with hemoglobin A1c goal below 6.5%.

## 2012-09-12 NOTE — Patient Instructions (Signed)
Recommend he continue aspirin for stroke prevention and maintain strict control of hypertension with blood pressure goal below 120/80 and diabetes with hemoglobin A1c goal below 6.5% and lipids with LDL cholesterol goal below 70 mg percent. I have advised him to followup with his primary physician Dr.Kohut for constipation. Return for followup with Mike Mack, nurse practitioner in 6 months.

## 2012-11-17 ENCOUNTER — Encounter (HOSPITAL_COMMUNITY): Payer: Self-pay | Admitting: *Deleted

## 2012-11-17 ENCOUNTER — Emergency Department (HOSPITAL_COMMUNITY)
Admission: EM | Admit: 2012-11-17 | Discharge: 2012-11-17 | Disposition: A | Discharge status: Home / Self Care | Payer: Medicare Other | Attending: Emergency Medicine | Admitting: Emergency Medicine

## 2012-11-17 DIAGNOSIS — N400 Enlarged prostate without lower urinary tract symptoms: Secondary | ICD-10-CM | POA: Insufficient documentation

## 2012-11-17 DIAGNOSIS — Z7982 Long term (current) use of aspirin: Secondary | ICD-10-CM | POA: Insufficient documentation

## 2012-11-17 DIAGNOSIS — Z8673 Personal history of transient ischemic attack (TIA), and cerebral infarction without residual deficits: Secondary | ICD-10-CM | POA: Insufficient documentation

## 2012-11-17 DIAGNOSIS — Z79899 Other long term (current) drug therapy: Secondary | ICD-10-CM | POA: Insufficient documentation

## 2012-11-17 DIAGNOSIS — M542 Cervicalgia: Secondary | ICD-10-CM | POA: Insufficient documentation

## 2012-11-17 DIAGNOSIS — M19019 Primary osteoarthritis, unspecified shoulder: Secondary | ICD-10-CM | POA: Insufficient documentation

## 2012-11-17 DIAGNOSIS — E1149 Type 2 diabetes mellitus with other diabetic neurological complication: Secondary | ICD-10-CM | POA: Insufficient documentation

## 2012-11-17 DIAGNOSIS — M25519 Pain in unspecified shoulder: Secondary | ICD-10-CM | POA: Insufficient documentation

## 2012-11-17 DIAGNOSIS — Z794 Long term (current) use of insulin: Secondary | ICD-10-CM | POA: Insufficient documentation

## 2012-11-17 DIAGNOSIS — I10 Essential (primary) hypertension: Secondary | ICD-10-CM | POA: Insufficient documentation

## 2012-11-17 DIAGNOSIS — Z8612 Personal history of poliomyelitis: Secondary | ICD-10-CM | POA: Insufficient documentation

## 2012-11-17 DIAGNOSIS — E1142 Type 2 diabetes mellitus with diabetic polyneuropathy: Secondary | ICD-10-CM | POA: Insufficient documentation

## 2012-11-17 DIAGNOSIS — M25512 Pain in left shoulder: Secondary | ICD-10-CM

## 2012-11-17 DIAGNOSIS — M1990 Unspecified osteoarthritis, unspecified site: Secondary | ICD-10-CM

## 2012-11-17 DIAGNOSIS — Z8719 Personal history of other diseases of the digestive system: Secondary | ICD-10-CM | POA: Insufficient documentation

## 2012-11-17 NOTE — ED Notes (Signed)
Pt states for the past week he's had neck pain radiating down to both shoulders, states last night pain worsened, states he thinks b/c grandson turned A/C on.

## 2012-11-17 NOTE — ED Notes (Signed)
Pt ambulated to the BR and back with cane without difficulty

## 2012-11-17 NOTE — ED Provider Notes (Signed)
History     CSN: [id number]  Arrival date & time 11/17/12  1642   First MD Initiated Contact with Patient 11/17/12 1848      Chief Complaint  Patient presents with  . Shoulder Pain  . Neck Pain    (Consider location/radiation/quality/duration/timing/severity/associated sxs/prior treatment) HPI Comments: 77 year old male with a past medical history of hypertension, diabetes, neuropathy, stroke and BPH presents to the emergency department with his wife complaining of bilateral shoulder pain x1 week and neck pain x2 days. No known injury or trauma. Describes the pain as intermittent, achy, only present when he is cold, worse with movement. He states his symptoms have only been present since his grandson turned the air conditioning on. When the air conditioning is off work he is in a warm place his symptoms are not present. He took tramadol and Celebrex with complete relief of his pain. Denies any new numbness or tingling down his extremities other than his norm from his previous stroke. Denies chest pain, shortness of breath, fever, chills, confusion or any other symptoms.  Patient is a 77 y.o. male presenting with shoulder pain and neck pain. The history is provided by the patient and the spouse.  Shoulder Pain Associated symptoms include neck pain. Pertinent negatives include no chest pain, chills, fever, numbness or weakness.  Neck Pain Associated symptoms: no chest pain, no fever, no numbness and no weakness     Past Medical History  Diagnosis Date  . Diabetes mellitus without complication   . Hypertension   . Small bowel obstruction   . BPH (benign prostatic hyperplasia)   . Neuropathy   . Stroke   . Polio   . Polio Childhood    Past Surgical History  Procedure Laterality Date  . Bowel resection    . Hernia repair    . Cataract extraction w/ intraocular lens  implant, bilateral      Family History  Problem Relation Age of Onset  . Diabetes Sister   . Diabetes Sister    . Diabetes Brother   . Hypertension Sister   . Hypertension Sister   . Hypertension Brother   . Heart failure Mother   . Heart attack Sister     History  Substance Use Topics  . Smoking status: Never Smoker   . Smokeless tobacco: Never Used  . Alcohol Use: No      Review of Systems  Constitutional: Negative for fever, chills and activity change.  HENT: Positive for neck pain.   Respiratory: Negative for shortness of breath.   Cardiovascular: Negative for chest pain.  Musculoskeletal:       Positive for bilateral shoulder pain.  Neurological: Negative for weakness and numbness.  All other systems reviewed and are negative.    Allergies  Review of patient's allergies indicates no known allergies.  Home Medications   Current Outpatient Rx  Name  Route  Sig  Dispense  Refill  . amLODipine (NORVASC) 5 MG tablet   Oral   Take 5 mg by mouth daily.         Marland Kitchen aspirin 325 MG tablet   Oral   Take 1 tablet (325 mg total) by mouth daily.         Marland Kitchen atorvastatin (LIPITOR) 20 MG tablet   Oral   Take 1 tablet (20 mg total) by mouth daily at 6 PM.   30 tablet   1   . celecoxib (CELEBREX) 200 MG capsule   Oral   Take 200 mg  by mouth 2 (two) times daily.         . clotrimazole-betamethasone (LOTRISONE) lotion               . ELIDEL 1 % cream               . gabapentin (NEURONTIN) 800 MG tablet   Oral   Take 800 mg by mouth 3 (three) times daily.         . insulin detemir (LEVEMIR) 100 UNIT/ML injection   Subcutaneous   Inject 32 Units into the skin 2 (two) times daily.   10 mL   1   . irbesartan (AVAPRO) 150 MG tablet   Oral   Take 150 mg by mouth daily.          Marland Kitchen latanoprost (XALATAN) 0.005 % ophthalmic solution               . metFORMIN (GLUCOPHAGE) 1000 MG tablet   Oral   Take 1,000 mg by mouth 2 (two) times daily with a meal.         . ONE TOUCH ULTRA TEST test strip               . oxybutynin (DITROPAN-XL) 10 MG 24 hr  tablet   Oral   Take 10 mg by mouth daily.          . ramipril (ALTACE) 5 MG capsule   Oral   Take 5 mg by mouth daily.          Marland Kitchen sulfacetamide (BLEPH-10) 10 % ophthalmic solution               . Tamsulosin HCl (FLOMAX) 0.4 MG CAPS   Oral   Take 0.4 mg by mouth.         . traMADol (ULTRAM) 50 MG tablet   Oral   Take 50 mg by mouth every 6 (six) hours as needed for pain.         . vitamin B-12 (CYANOCOBALAMIN) 500 MCG tablet   Oral   Take 500 mcg by mouth daily.           BP 124/64  Pulse 92  Temp(Src) 98.7 F (37.1 C) (Oral)  Resp 18  SpO2 98%  Physical Exam  Constitutional: He is oriented to person, place, and time. He appears well-developed and well-nourished. No distress.  HENT:  Head: Normocephalic and atraumatic.  Mouth/Throat: Oropharynx is clear and moist.  Eyes: Conjunctivae and EOM are normal. Pupils are equal, round, and reactive to light.  Neck: Normal range of motion. Neck supple. No spinous process tenderness and no muscular tenderness present.  Cardiovascular: Normal rate, regular rhythm and normal heart sounds.   Pulmonary/Chest: Effort normal and breath sounds normal.  Abdominal: Soft. Bowel sounds are normal. There is no tenderness.  Musculoskeletal: Normal range of motion. He exhibits no edema.  Crepitus noted bilateral shoulders, pain with ROM, generalized tenderness. No tenderness of C-spine, full ROM.  Neurological: He is alert and oriented to person, place, and time. He has normal strength.  Skin: Skin is warm and dry. He is not diaphoretic.  Psychiatric: He has a normal mood and affect. His behavior is normal.    ED Course  Procedures (including critical care time)  Labs Reviewed - No data to display No results found.   1. Arthritis   2. Shoulder pain, bilateral   3. Neck pain       MDM  77 year old male with bilateral shoulder  pain and neck pain, worse when:Marland Kitchen He is in no apparent distress normal vital signs the  emergency department. Crepitus in bilateral shoulders, pain with range of motion, no deformity. Neurovascularly intact. Full range of motion and no tenderness of C-spine. Asymptomatic in the emergency department. He is stable for discharge. Continue tramadol and Celebrex. Case discussed with Dr. Juleen China who also evaluated patient and agrees plan of care. Return precautions discussed. Patient states understanding of plan and is agreeable.    Trevor Mace, PA-C 11/17/12 [phone]

## 2012-11-18 NOTE — ED Provider Notes (Signed)
Medical screening examination/treatment/procedure(s) were conducted as a shared visit with non-physician practitioner(s) and myself.  I personally evaluated the patient during the encounter.  77 year old male with atraumatic shoulder pain. Pain is worse with movement and being in cold environment. He is neurovascularly intact. No evidence of an infectious process. This is more than likely secondary to degenerative joint disease. Do not feel that plain radiography will potentially change current treatment plan and that more advanced imaging is indicated. Will treat symptomatically. Patient was instructed to followup with his primary care provider and to give his primary care provider a hard time if he makes pt wait an extended period of time in the waiting room.   Raeford Razor, MD 11/18/12 1547

## 2012-11-24 ENCOUNTER — Emergency Department (HOSPITAL_COMMUNITY)
Admission: EM | Admit: 2012-11-24 | Discharge: 2012-11-24 | Disposition: A | Discharge status: Home / Self Care | Payer: Medicare Other | Attending: Emergency Medicine | Admitting: Emergency Medicine

## 2012-11-24 ENCOUNTER — Encounter (HOSPITAL_COMMUNITY): Payer: Self-pay | Admitting: *Deleted

## 2012-11-24 DIAGNOSIS — I10 Essential (primary) hypertension: Secondary | ICD-10-CM | POA: Insufficient documentation

## 2012-11-24 DIAGNOSIS — Z79899 Other long term (current) drug therapy: Secondary | ICD-10-CM | POA: Insufficient documentation

## 2012-11-24 DIAGNOSIS — M129 Arthropathy, unspecified: Secondary | ICD-10-CM | POA: Insufficient documentation

## 2012-11-24 DIAGNOSIS — M25512 Pain in left shoulder: Secondary | ICD-10-CM

## 2012-11-24 DIAGNOSIS — M542 Cervicalgia: Secondary | ICD-10-CM

## 2012-11-24 DIAGNOSIS — Z8612 Personal history of poliomyelitis: Secondary | ICD-10-CM | POA: Insufficient documentation

## 2012-11-24 DIAGNOSIS — Z8719 Personal history of other diseases of the digestive system: Secondary | ICD-10-CM | POA: Insufficient documentation

## 2012-11-24 DIAGNOSIS — Z7982 Long term (current) use of aspirin: Secondary | ICD-10-CM | POA: Insufficient documentation

## 2012-11-24 DIAGNOSIS — Z8673 Personal history of transient ischemic attack (TIA), and cerebral infarction without residual deficits: Secondary | ICD-10-CM | POA: Insufficient documentation

## 2012-11-24 DIAGNOSIS — Z8669 Personal history of other diseases of the nervous system and sense organs: Secondary | ICD-10-CM | POA: Insufficient documentation

## 2012-11-24 DIAGNOSIS — E119 Type 2 diabetes mellitus without complications: Secondary | ICD-10-CM | POA: Insufficient documentation

## 2012-11-24 DIAGNOSIS — Z794 Long term (current) use of insulin: Secondary | ICD-10-CM | POA: Insufficient documentation

## 2012-11-24 DIAGNOSIS — N400 Enlarged prostate without lower urinary tract symptoms: Secondary | ICD-10-CM | POA: Insufficient documentation

## 2012-11-24 DIAGNOSIS — M25519 Pain in unspecified shoulder: Secondary | ICD-10-CM | POA: Insufficient documentation

## 2012-11-24 HISTORY — DX: Unspecified osteoarthritis, unspecified site: M19.90

## 2012-11-24 MED ORDER — HYDROCODONE-ACETAMINOPHEN 5-325 MG PO TABS
2.0000 | ORAL_TABLET | Freq: Once | ORAL | Status: AC
  Administered 2012-11-24: 2 via ORAL
  Filled 2012-11-24: qty 2

## 2012-11-24 MED ORDER — HYDROCODONE-ACETAMINOPHEN 5-325 MG PO TABS: ORAL_TABLET | ORAL | Status: DC

## 2012-11-24 NOTE — ED Provider Notes (Signed)
History     CSN: [id number]  Arrival date & time 11/24/12  0133   First MD Initiated Contact with Patient 11/24/12 0155      Chief Complaint  Patient presents with  . Shoulder Pain  . Neck Pain    (Consider location/radiation/quality/duration/timing/severity/associated sxs/prior treatment) HPI 77 year old male presents to emergency room complaining of bilateral shoulder pain, and neck pain.  Patient reports pain has been ongoing for some time.  He was seen by an orthopedist a week and a half ago and started on Celebrex and Ultram.  He was seen in the emergency Department 6 days ago.  He was recommended to follow up with his primary care Dr. for possible MRI.  Patient and wife report he took Ultram before going to bed last night, but woke with worsening pain.  He did not take any additional medicines prior to coming to the emergency department.  No trauma to the area.  No fevers no chills.  No warmth.  No peripheral numbness, tingling, or weakness.  Pain is worse with movement.  Patient is able to flex and extend at the neck, but reports pain with lateral rotation.  Patient with pain with range of motion to either shoulder in any direction.  Past Medical History  Diagnosis Date  . Diabetes mellitus without complication   . Hypertension   . Small bowel obstruction   . BPH (benign prostatic hyperplasia)   . Neuropathy   . Stroke   . Polio   . Polio Childhood  . Arthritis     Past Surgical History  Procedure Laterality Date  . Bowel resection    . Hernia repair    . Cataract extraction w/ intraocular lens  implant, bilateral      Family History  Problem Relation Age of Onset  . Diabetes Sister   . Diabetes Sister   . Diabetes Brother   . Hypertension Sister   . Hypertension Sister   . Hypertension Brother   . Heart failure Mother   . Heart attack Sister     History  Substance Use Topics  . Smoking status: Never Smoker   . Smokeless tobacco: Never Used  . Alcohol Use:  No      Review of Systems  See History of Present Illness; otherwise all other systems are reviewed and negative  Allergies  Review of patient's allergies indicates no known allergies.  Home Medications   Current Outpatient Rx  Name  Route  Sig  Dispense  Refill  . amLODipine (NORVASC) 5 MG tablet   Oral   Take 5 mg by mouth daily.         Marland Kitchen aspirin 325 MG tablet   Oral   Take 1 tablet (325 mg total) by mouth daily.         Marland Kitchen atorvastatin (LIPITOR) 20 MG tablet   Oral   Take 1 tablet (20 mg total) by mouth daily at 6 PM.   30 tablet   1   . celecoxib (CELEBREX) 200 MG capsule   Oral   Take 200 mg by mouth 2 (two) times daily.         . clotrimazole-betamethasone (LOTRISONE) lotion               . ELIDEL 1 % cream               . gabapentin (NEURONTIN) 800 MG tablet   Oral   Take 800 mg by mouth 3 (three) times daily.         Marland Kitchen  HYDROcodone-acetaminophen (NORCO/VICODIN) 5-325 MG per tablet      1-2 tabs po q 4-6 hours prn pain   20 tablet   0   . insulin detemir (LEVEMIR) 100 UNIT/ML injection   Subcutaneous   Inject 32 Units into the skin 2 (two) times daily.   10 mL   1   . irbesartan (AVAPRO) 150 MG tablet   Oral   Take 150 mg by mouth daily.          Marland Kitchen latanoprost (XALATAN) 0.005 % ophthalmic solution               . metFORMIN (GLUCOPHAGE) 1000 MG tablet   Oral   Take 1,000 mg by mouth 2 (two) times daily with a meal.         . ONE TOUCH ULTRA TEST test strip               . oxybutynin (DITROPAN-XL) 10 MG 24 hr tablet   Oral   Take 10 mg by mouth daily.          . ramipril (ALTACE) 5 MG capsule   Oral   Take 5 mg by mouth daily.          Marland Kitchen sulfacetamide (BLEPH-10) 10 % ophthalmic solution               . Tamsulosin HCl (FLOMAX) 0.4 MG CAPS   Oral   Take 0.4 mg by mouth.         . vitamin B-12 (CYANOCOBALAMIN) 500 MCG tablet   Oral   Take 500 mcg by mouth daily.           BP 114/58  Pulse 98   Temp(Src) 98.8 F (37.1 C) (Oral)  Resp 20  Ht 5\' 5"  (1.651 m)  Wt 125 lb (56.7 kg)  BMI 20.8 kg/m2  SpO2 98%  Physical Exam  Nursing note and vitals reviewed. Constitutional: He is oriented to person, place, and time. He appears well-developed and well-nourished.  HENT:  Head: Normocephalic and atraumatic.  Nose: Nose normal.  Mouth/Throat: Oropharynx is clear and moist.  Eyes: Conjunctivae and EOM are normal. Pupils are equal, round, and reactive to light.  Neck: Neck supple. No JVD present. No tracheal deviation present. No thyromegaly present.  Patient with limited lateral rotation of the neck secondary to pain  Cardiovascular: Normal rate, regular rhythm, normal heart sounds and intact distal pulses.  Exam reveals no gallop and no friction rub.   No murmur heard. Pulmonary/Chest: Effort normal and breath sounds normal. No stridor. No respiratory distress. He has no wheezes. He has no rales. He exhibits no tenderness.  Abdominal: Soft. Bowel sounds are normal. He exhibits no distension and no mass. There is no tenderness. There is no rebound and no guarding.  Musculoskeletal: He exhibits tenderness (tenderness to palpation to bilateral lateral neck as well as bilateral shoulder.  There is no step-off, no crepitus, no warmth or erythema noted to any joints). He exhibits no edema.  Patient has limited range of motion of shoulders, bilaterally, secondary to pain  Lymphadenopathy:    He has no cervical adenopathy.  Neurological: He is alert and oriented to person, place, and time. He has normal reflexes. No cranial nerve deficit. He exhibits normal muscle tone. Coordination normal.  Patient seems a bit confused at times, and has difficulties answering some questions.    Skin: Skin is warm and dry. No rash noted. No erythema. No pallor.  Psychiatric: He has a  normal mood and affect. His behavior is normal. Judgment and thought content normal.    ED Course  Procedures (including  critical care time)  Labs Reviewed - No data to display No results found.   1. Neck pain   2. Shoulder pain, bilateral       MDM  77 year old male with bilateral shoulder pain, and neck pain.  Patient again advised as he was last visit, that he will need further outpatient workup with possible MRI.  Patient does not appear to have central cord syndrome.  No focal findings on exam.  Aside from pain with range of motion.  Patient changed from Ultram to hydrocodone.  Patient reports he has an orthopedist, as well as a primary care Dr.  He reports that his appointment with his doctor is next month.  I had advised that he should try to get an earlier appointment.        Olivia Mackie, MD 11/24/12 [phone]

## 2012-11-24 NOTE — ED Notes (Signed)
Patient reports pain in bilateral shoulders. States he woke up tonight and it is increased from what it normally is. Patient was seen by an orthopedist earlier this week, patient was told it was arthritis. Patient was started on celebrex, about 1 week ago following that appt.

## 2012-11-24 NOTE — ED Notes (Signed)
Pt c/o bilateral shoulder pain and neck pain which he r/t arthritis. Pt denies CP and SoB.

## 2012-11-27 ENCOUNTER — Emergency Department (HOSPITAL_COMMUNITY)
Admission: EM | Admit: 2012-11-27 | Discharge: 2012-11-27 | Disposition: A | Discharge status: Home / Self Care | Payer: Medicare Other | Attending: Emergency Medicine | Admitting: Emergency Medicine

## 2012-11-27 ENCOUNTER — Encounter (HOSPITAL_COMMUNITY): Payer: Self-pay | Admitting: *Deleted

## 2012-11-27 DIAGNOSIS — M25511 Pain in right shoulder: Secondary | ICD-10-CM

## 2012-11-27 DIAGNOSIS — Z8619 Personal history of other infectious and parasitic diseases: Secondary | ICD-10-CM | POA: Insufficient documentation

## 2012-11-27 DIAGNOSIS — Z8739 Personal history of other diseases of the musculoskeletal system and connective tissue: Secondary | ICD-10-CM | POA: Insufficient documentation

## 2012-11-27 DIAGNOSIS — E119 Type 2 diabetes mellitus without complications: Secondary | ICD-10-CM | POA: Insufficient documentation

## 2012-11-27 DIAGNOSIS — N400 Enlarged prostate without lower urinary tract symptoms: Secondary | ICD-10-CM | POA: Insufficient documentation

## 2012-11-27 DIAGNOSIS — Z79899 Other long term (current) drug therapy: Secondary | ICD-10-CM | POA: Insufficient documentation

## 2012-11-27 DIAGNOSIS — Z8719 Personal history of other diseases of the digestive system: Secondary | ICD-10-CM | POA: Insufficient documentation

## 2012-11-27 DIAGNOSIS — M25519 Pain in unspecified shoulder: Secondary | ICD-10-CM | POA: Insufficient documentation

## 2012-11-27 DIAGNOSIS — M542 Cervicalgia: Secondary | ICD-10-CM | POA: Insufficient documentation

## 2012-11-27 DIAGNOSIS — I10 Essential (primary) hypertension: Secondary | ICD-10-CM | POA: Insufficient documentation

## 2012-11-27 DIAGNOSIS — Z8673 Personal history of transient ischemic attack (TIA), and cerebral infarction without residual deficits: Secondary | ICD-10-CM | POA: Insufficient documentation

## 2012-11-27 DIAGNOSIS — Z794 Long term (current) use of insulin: Secondary | ICD-10-CM | POA: Insufficient documentation

## 2012-11-27 DIAGNOSIS — Z7982 Long term (current) use of aspirin: Secondary | ICD-10-CM | POA: Insufficient documentation

## 2012-11-27 LAB — COMPREHENSIVE METABOLIC PANEL
ALT: 6 U/L (ref 0–53)
AST: 8 U/L (ref 0–37)
Albumin: 3.3 g/dL — ABNORMAL LOW (ref 3.5–5.2)
Alkaline Phosphatase: 96 U/L (ref 39–117)
Chloride: 102 mEq/L (ref 96–112)
Potassium: 4.2 mEq/L (ref 3.5–5.1)
Total Bilirubin: 0.3 mg/dL (ref 0.3–1.2)

## 2012-11-27 LAB — CBC WITH DIFFERENTIAL/PLATELET
Basophils Absolute: 0 10*3/uL (ref 0.0–0.1)
Basophils Relative: 0 % (ref 0–1)
Hemoglobin: 10.5 g/dL — ABNORMAL LOW (ref 13.0–17.0)
MCHC: 33.3 g/dL (ref 30.0–36.0)
Neutro Abs: 5.7 10*3/uL (ref 1.7–7.7)
Neutrophils Relative %: 76 % (ref 43–77)
RDW: 14.8 % (ref 11.5–15.5)
WBC: 7.6 10*3/uL (ref 4.0–10.5)

## 2012-11-27 MED ORDER — HYDROMORPHONE HCL 4 MG PO TABS
4.0000 mg | ORAL_TABLET | ORAL | Status: DC | PRN
Start: 2012-11-27 — End: 2013-03-27

## 2012-11-27 MED ORDER — HYDROMORPHONE HCL 2 MG PO TABS
2.0000 mg | ORAL_TABLET | Freq: Once | ORAL | Status: AC
  Administered 2012-11-27: 2 mg via ORAL
  Filled 2012-11-27: qty 1

## 2012-11-27 NOTE — ED Notes (Signed)
Pt c/o bilateral shoulder pain; pt was seen on 6/8 for the same thing; pt has also seen Orthopedist and was advised that he has arthritis; pt states the pain is not better than what pain was; pt was also seen his PCP on 6/10 and was prescribed Dilaudid 2 mg po for pain.

## 2012-11-27 NOTE — ED Provider Notes (Signed)
History     CSN: [id number]  Arrival date & time 11/27/12  0441   First MD Initiated Contact with Patient 11/27/12 0457      Chief Complaint  Patient presents with  . Shoulder Pain    (Consider location/radiation/quality/duration/timing/severity/associated sxs/prior treatment) Patient is a 77 y.o. male presenting with shoulder pain. The history is provided by the patient.  Shoulder Pain  He has been having bilateral shoulder pain for the last 3-4 weeks. He thinks that he bumped his shoulder going through a doorway prior to onset of pain. He is complaining of severe pain in both shoulders. He saw his orthopedic Dr. who put him on Celebrex and Ultram with no relief. He was then placed on Vicodin and then on hydromorphone of which are not getting adequate relief of pain. He was awakened at 4 AM with severe pain. He rates his pain at 4/10 at rest, and 10/10 if he tries to move his arms. Pain is worse with any movement at the shoulder. He denies prior shoulder problems. He is also having some mild neck pain. He denies any numbness or tingling or focal weakness. He does feel generally weak.  Past Medical History  Diagnosis Date  . Diabetes mellitus without complication   . Hypertension   . Small bowel obstruction   . BPH (benign prostatic hyperplasia)   . Neuropathy   . Stroke   . Polio   . Polio Childhood  . Arthritis     Past Surgical History  Procedure Laterality Date  . Bowel resection    . Hernia repair    . Cataract extraction w/ intraocular lens  implant, bilateral      Family History  Problem Relation Age of Onset  . Diabetes Sister   . Diabetes Sister   . Diabetes Brother   . Hypertension Sister   . Hypertension Sister   . Hypertension Brother   . Heart failure Mother   . Heart attack Sister     History  Substance Use Topics  . Smoking status: Never Smoker   . Smokeless tobacco: Never Used  . Alcohol Use: No      Review of Systems  All other systems  reviewed and are negative.    Allergies  Review of patient's allergies indicates no known allergies.  Home Medications   Current Outpatient Rx  Name  Route  Sig  Dispense  Refill  . amLODipine (NORVASC) 5 MG tablet   Oral   Take 5 mg by mouth daily.         Marland Kitchen aspirin 325 MG tablet   Oral   Take 1 tablet (325 mg total) by mouth daily.         Marland Kitchen atorvastatin (LIPITOR) 20 MG tablet   Oral   Take 1 tablet (20 mg total) by mouth daily at 6 PM.   30 tablet   1   . celecoxib (CELEBREX) 200 MG capsule   Oral   Take 200 mg by mouth 2 (two) times daily.         Marland Kitchen gabapentin (NEURONTIN) 800 MG tablet   Oral   Take 800 mg by mouth 3 (three) times daily.         Marland Kitchen HYDROmorphone (DILAUDID) 2 MG tablet   Oral   Take 2 mg by mouth every 8 (eight) hours as needed for pain.         Marland Kitchen insulin detemir (LEVEMIR) 100 UNIT/ML injection   Subcutaneous   Inject 32  Units into the skin 2 (two) times daily.   10 mL   1   . irbesartan (AVAPRO) 150 MG tablet   Oral   Take 150 mg by mouth daily.          Marland Kitchen latanoprost (XALATAN) 0.005 % ophthalmic solution   Both Eyes   Place 1 drop into both eyes at bedtime.          . metFORMIN (GLUCOPHAGE) 1000 MG tablet   Oral   Take 1,000 mg by mouth 2 (two) times daily with a meal.         . oxybutynin (DITROPAN-XL) 10 MG 24 hr tablet   Oral   Take 10 mg by mouth daily.          . ramipril (ALTACE) 5 MG capsule   Oral   Take 5 mg by mouth daily.          . Tamsulosin HCl (FLOMAX) 0.4 MG CAPS   Oral   Take 0.4 mg by mouth.         . vitamin B-12 (CYANOCOBALAMIN) 500 MCG tablet   Oral   Take 500 mcg by mouth daily.           BP 147/71  Pulse 79  Temp(Src) 98.7 F (37.1 C) (Oral)  Resp 20  Ht 5\' 5"  (1.651 m)  Wt 130 lb (58.968 kg)  BMI 21.63 kg/m2  SpO2 98%  Physical Exam  Nursing note and vitals reviewed.  77 year old male, resting comfortably and in no acute distress. Vital signs are significant for  hypertension with blood pressure 147/71. Oxygen saturation is 98%, which is normal. Head is normocephalic and atraumatic. PERRLA, EOMI. Oropharynx is clear. Neck is mildly tender and supple without adenopathy or JVD. Back is nontender and there is no CVA tenderness. Lungs are clear without rales, wheezes, or rhonchi. Chest is nontender. Heart has regular rate and rhythm without murmur. Abdomen is soft, flat, nontender without masses or hepatosplenomegaly and peristalsis is normoactive. Extremities have no cyanosis or edema. There is no tenderness to palpation of the shoulders. There is severe pain with passive range of motion. I can abduct his arm about 20-30 before he starts complaining of severe pain. Similar limitation is present with forward flexion. Neurovascular exam is intact with strong pulses, prompt capillary refill, normal sensation, and normal strength in distal musculature. Skin is warm and dry without rash. Neurologic: Mental status is normal, cranial nerves are intact, there are no motor or sensory deficits. Muscle strength was 5/5 in all muscles tested. Shoulder abductors could not be tested because of pain.   ED Course  Procedures (including critical care time)  Results for orders placed during the hospital encounter of 11/27/12  CBC WITH DIFFERENTIAL      Result Value Range   WBC 7.6  4.0 - 10.5 K/uL   RBC 3.54 (*) 4.22 - 5.81 MIL/uL   Hemoglobin 10.5 (*) 13.0 - 17.0 g/dL   HCT 65.7 (*) 84.6 - 96.2 %   MCV 89.0  78.0 - 100.0 fL   MCH 29.7  26.0 - 34.0 pg   MCHC 33.3  30.0 - 36.0 g/dL   RDW 95.2  84.1 - 32.4 %   Platelets 288  150 - 400 K/uL   Neutrophils Relative % 76  43 - 77 %   Neutro Abs 5.7  1.7 - 7.7 K/uL   Lymphocytes Relative 17  12 - 46 %   Lymphs Abs 1.3  0.7 -  4.0 K/uL   Monocytes Relative 7  3 - 12 %   Monocytes Absolute 0.5  0.1 - 1.0 K/uL   Eosinophils Relative 0  0 - 5 %   Eosinophils Absolute 0.0  0.0 - 0.7 K/uL   Basophils Relative 0  0 - 1 %    Basophils Absolute 0.0  0.0 - 0.1 K/uL  COMPREHENSIVE METABOLIC PANEL      Result Value Range   Sodium 138  135 - 145 mEq/L   Potassium 4.2  3.5 - 5.1 mEq/L   Chloride 102  96 - 112 mEq/L   CO2 28  19 - 32 mEq/L   Glucose, Bld 104 (*) 70 - 99 mg/dL   BUN 21  6 - 23 mg/dL   Creatinine, Ser 1.61  0.50 - 1.35 mg/dL   Calcium 9.2  8.4 - 09.6 mg/dL   Total Protein 6.8  6.0 - 8.3 g/dL   Albumin 3.3 (*) 3.5 - 5.2 g/dL   AST 8  0 - 37 U/L   ALT 6  0 - 53 U/L   Alkaline Phosphatase 96  39 - 117 U/L   Total Bilirubin 0.3  0.3 - 1.2 mg/dL   GFR calc non Af Amer 56 (*) >90 mL/min   GFR calc Af Amer 64 (*) >90 mL/min  SEDIMENTATION RATE      Result Value Range   Sed Rate 9  0 - 16 mm/hr      1. Shoulder pain, bilateral       MDM  Bilateral shoulder pain which she seems to be located within the shoulder joint proper. He has had outpatient x-rays as I do not see an indication for repeat imaging. I will check a sedimentation rate today to rule out the polymyalgia rheumatica. There is no evidence of significant neurologic injury as so goal today will be pain control with the aim of referring him back to his orthopedic physician. Old records are reviewed and he has 2 recent ED visits for the same problem.  Sedimentation rate has come back normal. Shoulder pain seems to be almost entirely due 2 arthritis in the shoulders. Her physician that she's been seeing is having back is drained with a sports medicine specialty and I think he would benefit from evaluation by an orthopedic specialist. He is given a prescription for hydromorphone 4 mg this since he is not getting adequate relief with hydromorphone 2 mg.      Dione Booze, MD 11/27/12 [phone]

## 2013-02-21 ENCOUNTER — Other Ambulatory Visit: Payer: Self-pay | Admitting: Gastroenterology

## 2013-02-21 ENCOUNTER — Ambulatory Visit
Admission: RE | Admit: 2013-02-21 | Discharge: 2013-02-21 | Disposition: A | Discharge status: Home / Self Care | Payer: Medicare Other | Source: Ambulatory Visit | Attending: Gastroenterology | Admitting: Gastroenterology

## 2013-02-21 DIAGNOSIS — R1013 Epigastric pain: Secondary | ICD-10-CM

## 2013-02-27 ENCOUNTER — Other Ambulatory Visit: Payer: Self-pay | Admitting: Gastroenterology

## 2013-02-27 DIAGNOSIS — R109 Unspecified abdominal pain: Secondary | ICD-10-CM

## 2013-02-28 ENCOUNTER — Ambulatory Visit
Admission: RE | Admit: 2013-02-28 | Discharge: 2013-02-28 | Disposition: A | Discharge status: Home / Self Care | Payer: Medicare Other | Source: Ambulatory Visit | Attending: Gastroenterology | Admitting: Gastroenterology

## 2013-02-28 DIAGNOSIS — R109 Unspecified abdominal pain: Secondary | ICD-10-CM

## 2013-02-28 MED ORDER — IOHEXOL 300 MG/ML  SOLN
100.0000 mL | Freq: Once | INTRAMUSCULAR | Status: AC | PRN
  Administered 2013-02-28: 100 mL via INTRAVENOUS

## 2013-03-13 ENCOUNTER — Ambulatory Visit: Payer: Medicare Other | Admitting: Nurse Practitioner

## 2013-03-27 ENCOUNTER — Emergency Department (HOSPITAL_COMMUNITY)
Admission: EM | Admit: 2013-03-27 | Discharge: 2013-03-27 | Disposition: A | Discharge status: Home / Self Care | Payer: Medicare Other | Attending: Emergency Medicine | Admitting: Emergency Medicine

## 2013-03-27 ENCOUNTER — Emergency Department (HOSPITAL_COMMUNITY): Payer: Medicare Other

## 2013-03-27 ENCOUNTER — Encounter (HOSPITAL_COMMUNITY): Payer: Self-pay | Admitting: Emergency Medicine

## 2013-03-27 DIAGNOSIS — Z8619 Personal history of other infectious and parasitic diseases: Secondary | ICD-10-CM | POA: Insufficient documentation

## 2013-03-27 DIAGNOSIS — IMO0002 Reserved for concepts with insufficient information to code with codable children: Secondary | ICD-10-CM | POA: Insufficient documentation

## 2013-03-27 DIAGNOSIS — N390 Urinary tract infection, site not specified: Secondary | ICD-10-CM

## 2013-03-27 DIAGNOSIS — Z8673 Personal history of transient ischemic attack (TIA), and cerebral infarction without residual deficits: Secondary | ICD-10-CM | POA: Insufficient documentation

## 2013-03-27 DIAGNOSIS — Z8669 Personal history of other diseases of the nervous system and sense organs: Secondary | ICD-10-CM | POA: Insufficient documentation

## 2013-03-27 DIAGNOSIS — R209 Unspecified disturbances of skin sensation: Secondary | ICD-10-CM | POA: Insufficient documentation

## 2013-03-27 DIAGNOSIS — Z8719 Personal history of other diseases of the digestive system: Secondary | ICD-10-CM | POA: Insufficient documentation

## 2013-03-27 DIAGNOSIS — Z794 Long term (current) use of insulin: Secondary | ICD-10-CM | POA: Insufficient documentation

## 2013-03-27 DIAGNOSIS — M7989 Other specified soft tissue disorders: Secondary | ICD-10-CM | POA: Insufficient documentation

## 2013-03-27 DIAGNOSIS — Z7982 Long term (current) use of aspirin: Secondary | ICD-10-CM | POA: Insufficient documentation

## 2013-03-27 DIAGNOSIS — M129 Arthropathy, unspecified: Secondary | ICD-10-CM | POA: Insufficient documentation

## 2013-03-27 DIAGNOSIS — E119 Type 2 diabetes mellitus without complications: Secondary | ICD-10-CM | POA: Insufficient documentation

## 2013-03-27 DIAGNOSIS — I10 Essential (primary) hypertension: Secondary | ICD-10-CM | POA: Insufficient documentation

## 2013-03-27 DIAGNOSIS — N400 Enlarged prostate without lower urinary tract symptoms: Secondary | ICD-10-CM | POA: Insufficient documentation

## 2013-03-27 DIAGNOSIS — Z79899 Other long term (current) drug therapy: Secondary | ICD-10-CM | POA: Insufficient documentation

## 2013-03-27 DIAGNOSIS — R5381 Other malaise: Secondary | ICD-10-CM | POA: Insufficient documentation

## 2013-03-27 LAB — CBC WITH DIFFERENTIAL/PLATELET
Basophils Relative: 0 % (ref 0–1)
Eosinophils Absolute: 0 10*3/uL (ref 0.0–0.7)
HCT: 31.5 % — ABNORMAL LOW (ref 39.0–52.0)
Hemoglobin: 10.4 g/dL — ABNORMAL LOW (ref 13.0–17.0)
MCH: 30.6 pg (ref 26.0–34.0)
MCHC: 33 g/dL (ref 30.0–36.0)
MCV: 92.6 fL (ref 78.0–100.0)
Monocytes Absolute: 0.7 10*3/uL (ref 0.1–1.0)
Monocytes Relative: 10 % (ref 3–12)
Neutro Abs: 4.4 10*3/uL (ref 1.7–7.7)

## 2013-03-27 LAB — COMPREHENSIVE METABOLIC PANEL
Albumin: 3.1 g/dL — ABNORMAL LOW (ref 3.5–5.2)
BUN: 13 mg/dL (ref 6–23)
Chloride: 102 mEq/L (ref 96–112)
Creatinine, Ser: 1.06 mg/dL (ref 0.50–1.35)
GFR calc non Af Amer: 67 mL/min — ABNORMAL LOW (ref >90)
Total Bilirubin: 0.2 mg/dL — ABNORMAL LOW (ref 0.3–1.2)

## 2013-03-27 LAB — URINALYSIS, ROUTINE W REFLEX MICROSCOPIC
Glucose, UA: 500 mg/dL — AB
Nitrite: NEGATIVE
Protein, ur: NEGATIVE mg/dL
Urobilinogen, UA: 0.2 mg/dL (ref 0.0–1.0)

## 2013-03-27 LAB — URINE MICROSCOPIC-ADD ON

## 2013-03-27 MED ORDER — CIPROFLOXACIN HCL 500 MG PO TABS
500.0000 mg | ORAL_TABLET | Freq: Once | ORAL | Status: AC
  Administered 2013-03-27: 500 mg via ORAL
  Filled 2013-03-27: qty 1

## 2013-03-27 MED ORDER — CIPROFLOXACIN HCL 500 MG PO TABS: 500.0000 mg | ORAL_TABLET | Freq: Two times a day (BID) | ORAL | Status: DC

## 2013-03-27 NOTE — ED Notes (Signed)
EKG old and new given to EDP, Plunkett, MD. 

## 2013-03-27 NOTE — ED Notes (Signed)
Pt/family states ate dinner then took his 20 units of levemir, states doctor recently put him on 26 unites of levemir daily and he breaks it up by 20 units, states has not had his full 80 units yet today, states was on metformin but was upsetting his stomach so he stopped taking that and that's why he was placed on the levemir, family states after dinner and taking the insulin they felt like he was "out in space", also states he had a stroke last year, leaving him w/ some R sided weakness but today his R leg has been weaker than normal, pt states just his R leg feels weaker, denies any numbness/tingling or weakness in R arm/face. Pt denies pain.

## 2013-03-27 NOTE — ED Notes (Signed)
Family states that this afternoon after he took his insulin he started acting incoherent and that his right leg started getting weaker than normal and it started giving out on him  Pt has hx of stroke and has right sided weakness

## 2013-03-27 NOTE — ED Provider Notes (Addendum)
CSN: [id number]     Arrival date & time 03/27/13  1920 History   First MD Initiated Contact with Patient 03/27/13 1957     Chief Complaint  Patient presents with  . Blood Sugar Problem   (Consider location/radiation/quality/duration/timing/severity/associated sxs/prior Treatment) Patient is a 77 y.o. male presenting with weakness. The history is provided by the patient and the spouse.  Weakness This is a recurrent problem. The current episode started 1 to 2 hours ago. The problem occurs constantly. The problem has been gradually improving. Pertinent negatives include no chest pain, no abdominal pain, no headaches and no shortness of breath. Associated symptoms comments: Wife states that tonight after dinner his BS was 185 and he got 20u of insulin and then started not acting himself and seemed to have worsening weakness on his right side with mild baseline weakness from prior stroke.  Pt states he had felt well all day long and no c/o.  Here he states he feels normal except for maybe some increased numbness on the right leg.  Son had to help him down the steps tonight at home due to leg giving out on him.  Denies any falls. Nothing aggravates the symptoms. Nothing relieves the symptoms. He has tried nothing for the symptoms. The treatment provided no relief.    Past Medical History  Diagnosis Date  . Diabetes mellitus without complication   . Hypertension   . Small bowel obstruction   . BPH (benign prostatic hyperplasia)   . Neuropathy   . Stroke   . Polio   . Polio Childhood  . Arthritis    Past Surgical History  Procedure Laterality Date  . Bowel resection    . Hernia repair    . Cataract extraction w/ intraocular lens  implant, bilateral     Family History  Problem Relation Age of Onset  . Diabetes Sister   . Diabetes Sister   . Diabetes Brother   . Hypertension Sister   . Hypertension Sister   . Hypertension Brother   . Heart failure Mother   . Heart attack Sister     History  Substance Use Topics  . Smoking status: Never Smoker   . Smokeless tobacco: Never Used  . Alcohol Use: No    Review of Systems  Constitutional: Negative for fever and chills.  Respiratory: Negative for cough and shortness of breath.   Cardiovascular: Positive for leg swelling. Negative for chest pain.  Gastrointestinal: Negative for nausea, vomiting, abdominal pain and diarrhea.  Neurological: Positive for weakness and numbness. Negative for dizziness, syncope, facial asymmetry, speech difficulty and headaches.  All other systems reviewed and are negative.    Allergies  Review of patient's allergies indicates no known allergies.  Home Medications   Current Outpatient Rx  Name  Route  Sig  Dispense  Refill  . amLODipine (NORVASC) 5 MG tablet   Oral   Take 5 mg by mouth daily.         Marland Kitchen aspirin 325 MG tablet   Oral   Take 1 tablet (325 mg total) by mouth daily.         . insulin detemir (LEVEMIR) 100 UNIT/ML injection   Subcutaneous   Inject 80 Units into the skin at bedtime.         . irbesartan (AVAPRO) 300 MG tablet   Oral   Take 300 mg by mouth at bedtime.         Marland Kitchen latanoprost (XALATAN) 0.005 % ophthalmic solution  1 drop at bedtime.         . mirabegron ER (MYRBETRIQ) 25 MG TB24 tablet   Oral   Take 25 mg by mouth daily.         . Multiple Vitamin (MULTIVITAMIN WITH MINERALS) TABS tablet   Oral   Take 1 tablet by mouth daily.         . predniSONE (DELTASONE) 1 MG tablet   Oral   Take 1 mg by mouth daily. Take with 5 mg and taper as directed         . predniSONE (DELTASONE) 5 MG tablet   Oral   Take 5 mg by mouth 2 (two) times daily. 30 days starting 03/07/13         . Tamsulosin HCl (FLOMAX) 0.4 MG CAPS   Oral   Take 0.4 mg by mouth 2 (two) times daily.           BP 139/72  Pulse 88  Temp(Src) 99.1 F (37.3 C) (Oral)  Resp 14  SpO2 98% Physical Exam  Nursing note and vitals reviewed. Constitutional: He is  oriented to person, place, and time. He appears well-developed and well-nourished. No distress.  HENT:  Head: Normocephalic and atraumatic.  Mouth/Throat: Oropharynx is clear and moist.  Eyes: Conjunctivae and EOM are normal. Pupils are equal, round, and reactive to light.  Neck: Normal range of motion. Neck supple.  Cardiovascular: Normal rate, regular rhythm and intact distal pulses.   No murmur heard. Pulmonary/Chest: Effort normal and breath sounds normal. No respiratory distress. He has no wheezes. He has no rales.  Abdominal: Soft. He exhibits no distension. There is no tenderness. There is no rebound and no guarding.  Musculoskeletal: Normal range of motion. He exhibits no edema and no tenderness.  Neurological: He is alert and oriented to person, place, and time. He displays atrophy. No cranial nerve deficit. He exhibits abnormal muscle tone. Coordination normal.  3/5 muscle strength in the right lower leg with extensive atrophy.  Normal strength on the left and 5/5 muscle strength in bilateral upper ext.  Mild subjective decreased sensation in the right leg compared to left  Skin: Skin is warm and dry. No rash noted. No erythema.  Psychiatric: He has a normal mood and affect. His behavior is normal.    ED Course  Procedures (including critical care time) Labs Review Labs Reviewed  GLUCOSE, CAPILLARY - Abnormal; Notable for the following:    Glucose-Capillary 221 (*)    All other components within normal limits  CBC WITH DIFFERENTIAL - Abnormal; Notable for the following:    RBC 3.40 (*)    Hemoglobin 10.4 (*)    HCT 31.5 (*)    All other components within normal limits  COMPREHENSIVE METABOLIC PANEL - Abnormal; Notable for the following:    Glucose, Bld 206 (*)    Albumin 3.1 (*)    Total Bilirubin 0.2 (*)    GFR calc non Af Amer 67 (*)    GFR calc Af Amer 78 (*)    All other components within normal limits  URINALYSIS, ROUTINE W REFLEX MICROSCOPIC - Abnormal; Notable for  the following:    Glucose, UA 500 (*)    Leukocytes, UA SMALL (*)    All other components within normal limits  URINE MICROSCOPIC-ADD ON   Imaging Review Ct Head Wo Contrast  03/27/2013   CLINICAL DATA:  New onset weakness. No known injury.  EXAM: CT HEAD WITHOUT CONTRAST  TECHNIQUE: Contiguous axial  images were obtained from the base of the skull through the vertex without intravenous contrast.  COMPARISON:  04/10/2012 MRI.  FINDINGS: No mass lesion, mass effect, midline shift, hydrocephalus, hemorrhage. No acute territorial cortical ischemia/infarct. Atrophy and chronic ischemic white matter disease is present.Old right cerebellar lacunar infarct and lateral left thalamic lacunar infarct. Ventricular size and configuration appears unchanged, with low attenuation adjacent to the ventricles that may represent communicating hydrocephalus. Mastoid air cells and paranasal sinuses are within normal limits. Tiny lipoma is present at the torcula.  IMPRESSION: Atrophy, chronic ischemic white matter disease and scattered lacunar infarcts are unchanged. No acute intracranial abnormality. Right ventricular prominence is unchanged compared to the prior exam, potentially representing normal pressure hydrocephalus or communicating hydrocephalus.   Electronically Signed   By: Andreas Newport M.D.   On: 03/27/2013 20:42    EKG Interpretation   None      Date: 03/27/2013  Rate: 69  Rhythm: normal sinus rhythm  QRS Axis: normal  Intervals: normal  ST/T Wave abnormalities: normal  Conduction Disutrbances:none  Narrative Interpretation:   Old EKG Reviewed: unchanged    MDM   1. UTI (lower urinary tract infection)     Pt here with PMH of stroke with right sided weakness and polio with chronic right leg involvement who today wife states had an episode after eating dinner where he seemed sleepy and having worsening right sided weakness.  No N/V/SOB/CP.  No speech, swallowing or vision problems.  Pt always  drags his leg but worse.  Here he has bilateral edema in lower ext with extensive atrophy to the right leg.  This is baseline per wife.  Pt is able to move the leg some but 3/5 strength which family feels is normal.  Normal upper ext strength and normal cerebellar function.  No facial droop and blood sugar here 220.  Pt did take levemir at home and right after is when he acted strangely.  VS normal here.  Will get CBC, CMP, UA, head CT and EKG to further eval.  9:54 PM Head CT without acute findings.  Labs are wnl other than mild hyperglycemia and appears to have early UTI.  Will treat with cipro and d/c home.  Gwyneth Sprout, MD 03/27/13 0960  Gwyneth Sprout, MD 03/27/13 2226

## 2013-05-09 DIAGNOSIS — Z8619 Personal history of other infectious and parasitic diseases: Secondary | ICD-10-CM | POA: Insufficient documentation

## 2013-05-09 DIAGNOSIS — IMO0002 Reserved for concepts with insufficient information to code with codable children: Secondary | ICD-10-CM | POA: Insufficient documentation

## 2013-05-09 DIAGNOSIS — Z8719 Personal history of other diseases of the digestive system: Secondary | ICD-10-CM | POA: Insufficient documentation

## 2013-05-09 DIAGNOSIS — G589 Mononeuropathy, unspecified: Secondary | ICD-10-CM | POA: Insufficient documentation

## 2013-05-09 DIAGNOSIS — Z7982 Long term (current) use of aspirin: Secondary | ICD-10-CM | POA: Insufficient documentation

## 2013-05-09 DIAGNOSIS — M129 Arthropathy, unspecified: Secondary | ICD-10-CM | POA: Insufficient documentation

## 2013-05-09 DIAGNOSIS — N400 Enlarged prostate without lower urinary tract symptoms: Secondary | ICD-10-CM | POA: Insufficient documentation

## 2013-05-09 DIAGNOSIS — R109 Unspecified abdominal pain: Secondary | ICD-10-CM | POA: Insufficient documentation

## 2013-05-09 DIAGNOSIS — Z794 Long term (current) use of insulin: Secondary | ICD-10-CM | POA: Insufficient documentation

## 2013-05-09 DIAGNOSIS — Z79899 Other long term (current) drug therapy: Secondary | ICD-10-CM | POA: Insufficient documentation

## 2013-05-09 DIAGNOSIS — M545 Low back pain, unspecified: Secondary | ICD-10-CM | POA: Insufficient documentation

## 2013-05-09 DIAGNOSIS — I10 Essential (primary) hypertension: Secondary | ICD-10-CM | POA: Insufficient documentation

## 2013-05-09 DIAGNOSIS — E119 Type 2 diabetes mellitus without complications: Secondary | ICD-10-CM | POA: Insufficient documentation

## 2013-05-09 DIAGNOSIS — Z8673 Personal history of transient ischemic attack (TIA), and cerebral infarction without residual deficits: Secondary | ICD-10-CM | POA: Insufficient documentation

## 2013-05-09 DIAGNOSIS — R52 Pain, unspecified: Secondary | ICD-10-CM | POA: Insufficient documentation

## 2013-05-10 ENCOUNTER — Emergency Department (HOSPITAL_COMMUNITY)
Admission: EM | Admit: 2013-05-10 | Discharge: 2013-05-10 | Disposition: A | Discharge status: Home / Self Care | Payer: Medicare Other | Attending: Emergency Medicine | Admitting: Emergency Medicine

## 2013-05-10 ENCOUNTER — Emergency Department (HOSPITAL_COMMUNITY): Payer: Medicare Other

## 2013-05-10 ENCOUNTER — Encounter (HOSPITAL_COMMUNITY): Payer: Self-pay | Admitting: Emergency Medicine

## 2013-05-10 DIAGNOSIS — M549 Dorsalgia, unspecified: Secondary | ICD-10-CM

## 2013-05-10 LAB — URINALYSIS, ROUTINE W REFLEX MICROSCOPIC
Bilirubin Urine: NEGATIVE
Glucose, UA: 500 mg/dL — AB
Hgb urine dipstick: NEGATIVE
Ketones, ur: NEGATIVE mg/dL
Protein, ur: NEGATIVE mg/dL

## 2013-05-10 LAB — BASIC METABOLIC PANEL
BUN: 17 mg/dL (ref 6–23)
CO2: 26 mEq/L (ref 19–32)
Calcium: 9.3 mg/dL (ref 8.4–10.5)
Creatinine, Ser: 1.09 mg/dL (ref 0.50–1.35)
GFR calc Af Amer: 75 mL/min — ABNORMAL LOW (ref >90)

## 2013-05-10 LAB — CBC WITH DIFFERENTIAL/PLATELET
Basophils Absolute: 0 10*3/uL (ref 0.0–0.1)
Basophils Relative: 0 % (ref 0–1)
Eosinophils Relative: 0 % (ref 0–5)
HCT: 35.3 % — ABNORMAL LOW (ref 39.0–52.0)
MCHC: 32.6 g/dL (ref 30.0–36.0)
MCV: 91.2 fL (ref 78.0–100.0)
Monocytes Absolute: 0.6 10*3/uL (ref 0.1–1.0)
Monocytes Relative: 8 % (ref 3–12)
RDW: 13.3 % (ref 11.5–15.5)

## 2013-05-10 LAB — CG4 I-STAT (LACTIC ACID): Lactic Acid, Venous: 2.02 mmol/L (ref 0.5–2.2)

## 2013-05-10 LAB — URINE MICROSCOPIC-ADD ON

## 2013-05-10 MED ORDER — FENTANYL CITRATE 0.05 MG/ML IJ SOLN
50.0000 ug | Freq: Once | INTRAMUSCULAR | Status: AC
  Administered 2013-05-10: 50 ug via INTRAVENOUS
  Filled 2013-05-10: qty 2

## 2013-05-10 MED ORDER — IOHEXOL 300 MG/ML  SOLN
100.0000 mL | Freq: Once | INTRAMUSCULAR | Status: AC | PRN
  Administered 2013-05-10: 100 mL via INTRAVENOUS

## 2013-05-10 MED ORDER — TRAMADOL HCL 50 MG PO TABS: 50.0000 mg | ORAL_TABLET | Freq: Four times a day (QID) | ORAL | Status: DC | PRN

## 2013-05-10 NOTE — ED Provider Notes (Signed)
CSN: [id number]     Arrival date & time 05/09/13  2357 History   First MD Initiated Contact with Patient 05/10/13 0015     Chief Complaint  Patient presents with  . Back Pain    lower   (Consider location/radiation/quality/duration/timing/severity/associated sxs/prior Treatment) HPI 77 year old male presents to emergency department from home with complaint of acute onset of left lower back pain waking him from sleep at 11 PM.  No prior history of same.  Pain not worse with movement or stretching.  No recent trauma to the area.  No fever, nausea, vomiting.  Pain is sharp.  Patient has history of hypertension, diabetes, BPH, and CVA.   Past Medical History  Diagnosis Date  . Diabetes mellitus without complication   . Hypertension   . Small bowel obstruction   . BPH (benign prostatic hyperplasia)   . Neuropathy   . Stroke   . Polio   . Polio Childhood  . Arthritis    Past Surgical History  Procedure Laterality Date  . Bowel resection    . Hernia repair    . Cataract extraction w/ intraocular lens  implant, bilateral     Family History  Problem Relation Age of Onset  . Diabetes Sister   . Diabetes Sister   . Diabetes Brother   . Hypertension Sister   . Hypertension Sister   . Hypertension Brother   . Heart failure Mother   . Heart attack Sister    History  Substance Use Topics  . Smoking status: Never Smoker   . Smokeless tobacco: Never Used  . Alcohol Use: No    Review of Systems  See History of Present Illness; otherwise all other systems are reviewed and negative Allergies  Review of patient's allergies indicates no known allergies.  Home Medications   Current Outpatient Rx  Name  Route  Sig  Dispense  Refill  . amLODipine (NORVASC) 5 MG tablet   Oral   Take 5 mg by mouth daily.         Marland Kitchen aspirin 325 MG tablet   Oral   Take 1 tablet (325 mg total) by mouth daily.         Marland Kitchen atorvastatin (LIPITOR) 20 MG tablet   Oral   Take 1 tablet by mouth every  evening.         . ciprofloxacin (CIPRO) 500 MG tablet   Oral   Take 1 tablet (500 mg total) by mouth 2 (two) times daily.   14 tablet   0   . gabapentin (NEURONTIN) 800 MG tablet   Oral   Take 1 tablet by mouth 2 (two) times daily.         . insulin detemir (LEVEMIR) 100 UNIT/ML injection   Subcutaneous   Inject 80 Units into the skin at bedtime.         . irbesartan (AVAPRO) 300 MG tablet   Oral   Take 300 mg by mouth at bedtime.         Marland Kitchen latanoprost (XALATAN) 0.005 % ophthalmic solution      1 drop at bedtime.         . mirabegron ER (MYRBETRIQ) 25 MG TB24 tablet   Oral   Take 25 mg by mouth daily.         . Multiple Vitamin (MULTIVITAMIN WITH MINERALS) TABS tablet   Oral   Take 1 tablet by mouth daily.         . predniSONE (DELTASONE) 1  MG tablet   Oral   Take 1 mg by mouth daily. Take with 5 mg and taper as directed         . predniSONE (DELTASONE) 5 MG tablet   Oral   Take 5 mg by mouth 2 (two) times daily. 30 days starting 03/07/13         . Tamsulosin HCl (FLOMAX) 0.4 MG CAPS   Oral   Take 0.4 mg by mouth 2 (two) times daily.           BP 160/72  Pulse 68  Temp(Src) 98.2 F (36.8 C) (Oral)  Resp 16  SpO2 97% Physical Exam  Nursing note and vitals reviewed. Constitutional: He is oriented to person, place, and time. He appears well-developed and well-nourished.  Uncomfortable appearing  HENT:  Head: Normocephalic and atraumatic.  Nose: Nose normal.  Mouth/Throat: Oropharynx is clear and moist.  Eyes: Conjunctivae and EOM are normal. Pupils are equal, round, and reactive to light.  Neck: Normal range of motion. Neck supple. No JVD present. No tracheal deviation present. No thyromegaly present.  Cardiovascular: Normal rate, regular rhythm, normal heart sounds and intact distal pulses.  Exam reveals no gallop and no friction rub.   No murmur heard. Pulmonary/Chest: Effort normal and breath sounds normal. No stridor. No respiratory  distress. He has no wheezes. He has no rales. He exhibits no tenderness.  Abdominal: Soft. Bowel sounds are normal. He exhibits no distension and no mass. There is no tenderness. There is no rebound and no guarding.  Musculoskeletal: Normal range of motion. He exhibits no edema and no tenderness.  Lymphadenopathy:    He has no cervical adenopathy.  Neurological: He is alert and oriented to person, place, and time. He exhibits normal muscle tone. Coordination normal.  Skin: Skin is warm and dry. No rash noted. No erythema. No pallor.  Psychiatric: He has a normal mood and affect. His behavior is normal. Judgment and thought content normal.    ED Course  Procedures (including critical care time) Labs Review Labs Reviewed  URINALYSIS, ROUTINE W REFLEX MICROSCOPIC - Abnormal; Notable for the following:    APPearance CLOUDY (*)    Glucose, UA 500 (*)    Leukocytes, UA TRACE (*)    All other components within normal limits  CBC WITH DIFFERENTIAL - Abnormal; Notable for the following:    RBC 3.87 (*)    Hemoglobin 11.5 (*)    HCT 35.3 (*)    All other components within normal limits  BASIC METABOLIC PANEL - Abnormal; Notable for the following:    Sodium 134 (*)    Glucose, Bld 186 (*)    GFR calc non Af Amer 65 (*)    GFR calc Af Amer 75 (*)    All other components within normal limits  URINE MICROSCOPIC-ADD ON - Abnormal; Notable for the following:    Bacteria, UA MANY (*)    All other components within normal limits  URINE CULTURE  CG4 I-STAT (LACTIC ACID)   Imaging Review Ct Abdomen Pelvis W Contrast  05/10/2013   CLINICAL DATA:  Left flank pain, nontraumatic.  EXAM: CT ABDOMEN AND PELVIS WITH CONTRAST  TECHNIQUE: Multidetector CT imaging of the abdomen and pelvis was performed using the standard protocol following bolus administration of intravenous contrast.  CONTRAST:  OMNIPAQUE IOHEXOL 300 MG/ML  SOLN  COMPARISON:  02/28/2013  FINDINGS: BODY WALL: Unremarkable.  LOWER  CHEST: Trace bilateral pleural effusions.  ABDOMEN/PELVIS:  Liver: Hypervascular lesion in segment  7 of the liver measures approximately 1 cm. This and a lower subcapsular low-attenuation lesion appears stable dating back to 2006.  Biliary: Gallbladder distention without visible stone or pericholecystic edema.  Pancreas: Unremarkable.  Spleen: Nonspecific tiny low-attenuation lesion in the upper spleen.  Adrenals: Unremarkable.  Kidneys and ureters: Bilateral low dense cortical lesions. Those large enough to evaluate appear simple, cystic. No hydronephrosis. 2 mm stone in the lower pole right kidney.  Bladder: Unremarkable  Reproductive:Moderate prostate enlargement deforming the bladder base, associated with a calcification in the upper right gland.  Bowel: There is a moderate volume of formed stool with in the proximal colon, but the distal colon is decompressed. No bowel obstruction. The appendix is mildly dilated at 7 mm, and contains high-density material at its tip, but is predominantly gas filled. No periappendiceal stranding.  Retroperitoneum: No mass or adenopathy.  Peritoneum: Dystrophic calcifications in the in the rectoprostatic recess.  Vascular: No acute abnormality.  OSSEOUS: Atrophy of the anterior right thigh musculature.  IMPRESSION: 1. No findings to explain left flank pain. 2. Nonobstructive right nephrolithiasis. 3. Trace bilateral pleural effusions.   Electronically Signed   By: Tiburcio Pea M.D.   On: 05/10/2013 03:45    EKG Interpretation   None       MDM   1. Acute back pain    77 year old male with acute onset of left flank/low back pain.  Differential includes kidney stone, pyelonephritis, ruptured AAA.  Will check labs, imaging, as indicated    Olivia Mackie, MD 05/10/13 [phone]

## 2013-05-10 NOTE — ED Notes (Signed)
Pt presents with lower back pain that he says began around 10 pm last night. Pt denies injury. Pain is in the lower portion of his back, no hx of back pain.

## 2013-05-10 NOTE — ED Notes (Signed)
Patient is alert and oriented x3.  He is being seen for uncontrollable back pain.   He denies any past history of this issue befoor.. Currently he rates his pain 6 of 10.

## 2013-05-12 LAB — URINE CULTURE: Colony Count: >=100000

## 2013-05-13 ENCOUNTER — Inpatient Hospital Stay (HOSPITAL_COMMUNITY)
Admission: EM | Admit: 2013-05-13 | Discharge: 2013-05-19 | DRG: 445 | Disposition: A | Discharge status: Skilled Nursing Facility | Payer: Medicare Other | Attending: Internal Medicine | Admitting: Internal Medicine

## 2013-05-13 ENCOUNTER — Inpatient Hospital Stay (HOSPITAL_COMMUNITY): Payer: Medicare Other

## 2013-05-13 ENCOUNTER — Emergency Department (HOSPITAL_COMMUNITY): Payer: Medicare Other

## 2013-05-13 ENCOUNTER — Encounter (HOSPITAL_COMMUNITY): Payer: Self-pay | Admitting: Emergency Medicine

## 2013-05-13 DIAGNOSIS — Z8673 Personal history of transient ischemic attack (TIA), and cerebral infarction without residual deficits: Secondary | ICD-10-CM

## 2013-05-13 DIAGNOSIS — Z794 Long term (current) use of insulin: Secondary | ICD-10-CM

## 2013-05-13 DIAGNOSIS — N12 Tubulo-interstitial nephritis, not specified as acute or chronic: Secondary | ICD-10-CM | POA: Diagnosis present

## 2013-05-13 DIAGNOSIS — N390 Urinary tract infection, site not specified: Secondary | ICD-10-CM | POA: Diagnosis present

## 2013-05-13 DIAGNOSIS — G609 Hereditary and idiopathic neuropathy, unspecified: Secondary | ICD-10-CM | POA: Diagnosis present

## 2013-05-13 DIAGNOSIS — K560 Paralytic ileus: Secondary | ICD-10-CM | POA: Diagnosis present

## 2013-05-13 DIAGNOSIS — R1011 Right upper quadrant pain: Secondary | ICD-10-CM | POA: Diagnosis present

## 2013-05-13 DIAGNOSIS — IMO0002 Reserved for concepts with insufficient information to code with codable children: Secondary | ICD-10-CM

## 2013-05-13 DIAGNOSIS — D72829 Elevated white blood cell count, unspecified: Secondary | ICD-10-CM | POA: Diagnosis present

## 2013-05-13 DIAGNOSIS — M129 Arthropathy, unspecified: Secondary | ICD-10-CM | POA: Diagnosis present

## 2013-05-13 DIAGNOSIS — R0902 Hypoxemia: Secondary | ICD-10-CM | POA: Diagnosis present

## 2013-05-13 DIAGNOSIS — G14 Postpolio syndrome: Secondary | ICD-10-CM

## 2013-05-13 DIAGNOSIS — N179 Acute kidney failure, unspecified: Secondary | ICD-10-CM | POA: Diagnosis present

## 2013-05-13 DIAGNOSIS — Z7982 Long term (current) use of aspirin: Secondary | ICD-10-CM

## 2013-05-13 DIAGNOSIS — D649 Anemia, unspecified: Secondary | ICD-10-CM | POA: Diagnosis present

## 2013-05-13 DIAGNOSIS — A4902 Methicillin resistant Staphylococcus aureus infection, unspecified site: Secondary | ICD-10-CM | POA: Diagnosis present

## 2013-05-13 DIAGNOSIS — K8010 Calculus of gallbladder with chronic cholecystitis without obstruction: Secondary | ICD-10-CM | POA: Diagnosis present

## 2013-05-13 DIAGNOSIS — K810 Acute cholecystitis: Secondary | ICD-10-CM | POA: Diagnosis present

## 2013-05-13 DIAGNOSIS — D638 Anemia in other chronic diseases classified elsewhere: Secondary | ICD-10-CM | POA: Diagnosis present

## 2013-05-13 DIAGNOSIS — N400 Enlarged prostate without lower urinary tract symptoms: Secondary | ICD-10-CM | POA: Diagnosis present

## 2013-05-13 DIAGNOSIS — K819 Cholecystitis, unspecified: Secondary | ICD-10-CM

## 2013-05-13 DIAGNOSIS — I639 Cerebral infarction, unspecified: Secondary | ICD-10-CM | POA: Diagnosis present

## 2013-05-13 DIAGNOSIS — Z79899 Other long term (current) drug therapy: Secondary | ICD-10-CM

## 2013-05-13 DIAGNOSIS — E119 Type 2 diabetes mellitus without complications: Secondary | ICD-10-CM | POA: Diagnosis present

## 2013-05-13 DIAGNOSIS — K5900 Constipation, unspecified: Secondary | ICD-10-CM | POA: Diagnosis present

## 2013-05-13 DIAGNOSIS — K8000 Calculus of gallbladder with acute cholecystitis without obstruction: Principal | ICD-10-CM | POA: Diagnosis present

## 2013-05-13 DIAGNOSIS — I10 Essential (primary) hypertension: Secondary | ICD-10-CM | POA: Diagnosis present

## 2013-05-13 DIAGNOSIS — E1165 Type 2 diabetes mellitus with hyperglycemia: Secondary | ICD-10-CM | POA: Diagnosis present

## 2013-05-13 DIAGNOSIS — E860 Dehydration: Secondary | ICD-10-CM | POA: Diagnosis present

## 2013-05-13 DIAGNOSIS — B952 Enterococcus as the cause of diseases classified elsewhere: Secondary | ICD-10-CM | POA: Diagnosis present

## 2013-05-13 DIAGNOSIS — R109 Unspecified abdominal pain: Secondary | ICD-10-CM

## 2013-05-13 DIAGNOSIS — B91 Sequelae of poliomyelitis: Secondary | ICD-10-CM

## 2013-05-13 DIAGNOSIS — Z8249 Family history of ischemic heart disease and other diseases of the circulatory system: Secondary | ICD-10-CM

## 2013-05-13 DIAGNOSIS — K5641 Fecal impaction: Secondary | ICD-10-CM | POA: Diagnosis present

## 2013-05-13 DIAGNOSIS — Z833 Family history of diabetes mellitus: Secondary | ICD-10-CM

## 2013-05-13 HISTORY — DX: Anemia, unspecified: D64.9

## 2013-05-13 HISTORY — DX: Constipation, unspecified: K59.00

## 2013-05-13 LAB — URINALYSIS, ROUTINE W REFLEX MICROSCOPIC
Glucose, UA: 250 mg/dL — AB
Protein, ur: 100 mg/dL — AB
Specific Gravity, Urine: 1.024 (ref 1.005–1.030)
Urobilinogen, UA: 1 mg/dL (ref 0.0–1.0)

## 2013-05-13 LAB — COMPREHENSIVE METABOLIC PANEL
AST: 26 U/L (ref 0–37)
Albumin: 3.3 g/dL — ABNORMAL LOW (ref 3.5–5.2)
BUN: 22 mg/dL (ref 6–23)
Calcium: 9.5 mg/dL (ref 8.4–10.5)
Creatinine, Ser: 1.45 mg/dL — ABNORMAL HIGH (ref 0.50–1.35)
GFR calc Af Amer: 53 mL/min — ABNORMAL LOW (ref >90)
GFR calc non Af Amer: 46 mL/min — ABNORMAL LOW (ref >90)
Total Protein: 7.5 g/dL (ref 6.0–8.3)

## 2013-05-13 LAB — CBC WITH DIFFERENTIAL/PLATELET
Basophils Absolute: 0 10*3/uL (ref 0.0–0.1)
Basophils Relative: 0 % (ref 0–1)
Eosinophils Absolute: 0 10*3/uL (ref 0.0–0.7)
Eosinophils Relative: 0 % (ref 0–5)
HCT: 31.9 % — ABNORMAL LOW (ref 39.0–52.0)
Hemoglobin: 10.6 g/dL — ABNORMAL LOW (ref 13.0–17.0)
Lymphocytes Relative: 4 % — ABNORMAL LOW (ref 12–46)
MCHC: 33.2 g/dL (ref 30.0–36.0)
MCV: 89.4 fL (ref 78.0–100.0)
Monocytes Absolute: 1.2 10*3/uL — ABNORMAL HIGH (ref 0.1–1.0)
Monocytes Relative: 6 % (ref 3–12)
Platelets: 236 10*3/uL (ref 150–400)
RDW: 13.2 % (ref 11.5–15.5)

## 2013-05-13 LAB — APTT: aPTT: 36 seconds (ref 24–37)

## 2013-05-13 LAB — GLUCOSE, CAPILLARY
Glucose-Capillary: 74 mg/dL (ref 70–99)
Glucose-Capillary: 101 mg/dL — ABNORMAL HIGH (ref 70–99)

## 2013-05-13 LAB — PROTIME-INR: INR: 1.37 (ref 0.00–1.49)

## 2013-05-13 LAB — HEMOGLOBIN A1C
Hgb A1c MFr Bld: 7.8 % — ABNORMAL HIGH (ref <5.7)
Mean Plasma Glucose: 177 mg/dL — ABNORMAL HIGH (ref <117)

## 2013-05-13 LAB — URINE MICROSCOPIC-ADD ON

## 2013-05-13 MED ORDER — PIPERACILLIN-TAZOBACTAM 3.375 G IVPB
3.3750 g | Freq: Three times a day (TID) | INTRAVENOUS | Status: DC
  Administered 2013-05-13 – 2013-05-19 (×17): 3.375 g via INTRAVENOUS
  Filled 2013-05-13 (×19): qty 50

## 2013-05-13 MED ORDER — PREDNISONE 5 MG PO TABS: 6.0000 mg | ORAL_TABLET | Freq: Every day | ORAL | Status: DC

## 2013-05-13 MED ORDER — PIPERACILLIN-TAZOBACTAM 3.375 G IVPB: 3.3750 g | Freq: Once | INTRAVENOUS | Status: DC

## 2013-05-13 MED ORDER — ONDANSETRON HCL 4 MG/2ML IJ SOLN
4.0000 mg | Freq: Once | INTRAMUSCULAR | Status: AC
  Administered 2013-05-13: 4 mg via INTRAVENOUS
  Filled 2013-05-13: qty 2

## 2013-05-13 MED ORDER — IOHEXOL 300 MG/ML  SOLN
100.0000 mL | Freq: Once | INTRAMUSCULAR | Status: AC | PRN
  Administered 2013-05-13: 100 mL via INTRAVENOUS

## 2013-05-13 MED ORDER — VANCOMYCIN HCL IN DEXTROSE 1-5 GM/200ML-% IV SOLN
1000.0000 mg | Freq: Once | INTRAVENOUS | Status: AC
  Administered 2013-05-13: 1000 mg via INTRAVENOUS
  Filled 2013-05-13: qty 200

## 2013-05-13 MED ORDER — LATANOPROST 0.005 % OP SOLN
1.0000 [drp] | Freq: Every day | OPHTHALMIC | Status: DC
  Administered 2013-05-13 – 2013-05-18 (×6): 1 [drp] via OPHTHALMIC
  Filled 2013-05-13: qty 2.5

## 2013-05-13 MED ORDER — ONDANSETRON HCL 4 MG/2ML IJ SOLN
4.0000 mg | Freq: Four times a day (QID) | INTRAMUSCULAR | Status: DC | PRN
  Administered 2013-05-15: 4 mg via INTRAVENOUS
  Filled 2013-05-13 (×2): qty 2

## 2013-05-13 MED ORDER — GABAPENTIN 400 MG PO CAPS
800.0000 mg | ORAL_CAPSULE | Freq: Three times a day (TID) | ORAL | Status: DC | PRN
  Administered 2013-05-14: 800 mg via ORAL
  Filled 2013-05-13: qty 2

## 2013-05-13 MED ORDER — IOHEXOL 300 MG/ML  SOLN
50.0000 mL | Freq: Once | INTRAMUSCULAR | Status: AC | PRN
  Administered 2013-05-13: 50 mL via ORAL

## 2013-05-13 MED ORDER — LEVOFLOXACIN 500 MG PO TABS: 500.0000 mg | ORAL_TABLET | Freq: Once | ORAL | Status: DC

## 2013-05-13 MED ORDER — GABAPENTIN 800 MG PO TABS
800.0000 mg | ORAL_TABLET | Freq: Three times a day (TID) | ORAL | Status: DC | PRN
Start: 2013-05-13 — End: 2013-05-13

## 2013-05-13 MED ORDER — DEXTROSE 50 % IV SOLN
1.0000 | Freq: Once | INTRAVENOUS | Status: AC
  Administered 2013-05-13: 50 mL via INTRAVENOUS
  Filled 2013-05-13: qty 50

## 2013-05-13 MED ORDER — TAMSULOSIN HCL 0.4 MG PO CAPS
0.4000 mg | ORAL_CAPSULE | Freq: Two times a day (BID) | ORAL | Status: DC
  Administered 2013-05-13 – 2013-05-19 (×12): 0.4 mg via ORAL
  Filled 2013-05-13 (×13): qty 1

## 2013-05-13 MED ORDER — DARIFENACIN HYDROBROMIDE ER 15 MG PO TB24
15.0000 mg | ORAL_TABLET | Freq: Every day | ORAL | Status: DC
  Administered 2013-05-13 – 2013-05-19 (×7): 15 mg via ORAL
  Filled 2013-05-13 (×7): qty 1

## 2013-05-13 MED ORDER — FENTANYL CITRATE 0.05 MG/ML IJ SOLN
INTRAMUSCULAR | Status: AC
  Filled 2013-05-13: qty 2

## 2013-05-13 MED ORDER — MILK AND MOLASSES ENEMA
Freq: Once | RECTAL | Status: DC
  Filled 2013-05-13: qty 250

## 2013-05-13 MED ORDER — ONDANSETRON HCL 4 MG PO TABS: 4.0000 mg | ORAL_TABLET | Freq: Four times a day (QID) | ORAL | Status: DC | PRN

## 2013-05-13 MED ORDER — ENOXAPARIN SODIUM 40 MG/0.4ML ~~LOC~~ SOLN
40.0000 mg | SUBCUTANEOUS | Status: DC
  Filled 2013-05-13 (×2): qty 0.4

## 2013-05-13 MED ORDER — PANTOPRAZOLE SODIUM 40 MG IV SOLR
40.0000 mg | INTRAVENOUS | Status: DC
  Administered 2013-05-13 – 2013-05-18 (×6): 40 mg via INTRAVENOUS
  Filled 2013-05-13 (×7): qty 40

## 2013-05-13 MED ORDER — MORPHINE SULFATE 4 MG/ML IJ SOLN: 6.0000 mg | INTRAMUSCULAR | Status: DC | PRN

## 2013-05-13 MED ORDER — AMLODIPINE BESYLATE 5 MG PO TABS
5.0000 mg | ORAL_TABLET | Freq: Every morning | ORAL | Status: DC
  Administered 2013-05-13 – 2013-05-19 (×7): 5 mg via ORAL
  Filled 2013-05-13 (×7): qty 1

## 2013-05-13 MED ORDER — ALUM & MAG HYDROXIDE-SIMETH 200-200-20 MG/5ML PO SUSP
30.0000 mL | Freq: Four times a day (QID) | ORAL | Status: DC | PRN
  Administered 2013-05-15: 30 mL via ORAL
  Filled 2013-05-13 (×2): qty 30

## 2013-05-13 MED ORDER — IRBESARTAN 300 MG PO TABS
300.0000 mg | ORAL_TABLET | Freq: Every day | ORAL | Status: DC
  Administered 2013-05-13 – 2013-05-18 (×6): 300 mg via ORAL
  Filled 2013-05-13 (×7): qty 1

## 2013-05-13 MED ORDER — ACETAMINOPHEN 650 MG RE SUPP: 650.0000 mg | Freq: Four times a day (QID) | RECTAL | Status: DC | PRN

## 2013-05-13 MED ORDER — SODIUM CHLORIDE 0.9 % IV SOLN
1000.0000 mL | INTRAVENOUS | Status: DC
  Administered 2013-05-13: 1000 mL via INTRAVENOUS

## 2013-05-13 MED ORDER — MORPHINE SULFATE 4 MG/ML IJ SOLN
4.0000 mg | INTRAMUSCULAR | Status: DC | PRN
  Administered 2013-05-13: 4 mg via INTRAVENOUS
  Filled 2013-05-13: qty 1

## 2013-05-13 MED ORDER — ASPIRIN 325 MG PO TABS
325.0000 mg | ORAL_TABLET | Freq: Every day | ORAL | Status: DC
  Administered 2013-05-14 – 2013-05-19 (×6): 325 mg via ORAL
  Filled 2013-05-13 (×7): qty 1

## 2013-05-13 MED ORDER — SODIUM CHLORIDE 0.9 % IV SOLN
1000.0000 mL | Freq: Once | INTRAVENOUS | Status: AC
  Administered 2013-05-13: 1000 mL via INTRAVENOUS

## 2013-05-13 MED ORDER — MORPHINE SULFATE 2 MG/ML IJ SOLN
2.0000 mg | INTRAMUSCULAR | Status: DC | PRN
  Administered 2013-05-13: 4 mg via INTRAVENOUS
  Administered 2013-05-14 – 2013-05-19 (×9): 2 mg via INTRAVENOUS
  Filled 2013-05-13 (×3): qty 1
  Filled 2013-05-13: qty 2
  Filled 2013-05-13 (×6): qty 1

## 2013-05-13 MED ORDER — FENTANYL CITRATE 0.05 MG/ML IJ SOLN
INTRAMUSCULAR | Status: AC | PRN
  Administered 2013-05-13: 25 ug via INTRAVENOUS

## 2013-05-13 MED ORDER — PIPERACILLIN-TAZOBACTAM 3.375 G IVPB 30 MIN
3.3750 g | INTRAVENOUS | Status: AC
  Administered 2013-05-13: 3.375 g via INTRAVENOUS
  Filled 2013-05-13: qty 50

## 2013-05-13 MED ORDER — INSULIN ASPART 100 UNIT/ML ~~LOC~~ SOLN: 0.0000 [IU] | Freq: Three times a day (TID) | SUBCUTANEOUS | Status: DC

## 2013-05-13 MED ORDER — PREDNISONE 5 MG PO TABS
6.0000 mg | ORAL_TABLET | Freq: Every day | ORAL | Status: DC
  Administered 2013-05-13 – 2013-05-19 (×7): 6 mg via ORAL
  Filled 2013-05-13 (×8): qty 1

## 2013-05-13 MED ORDER — ALBUTEROL SULFATE (5 MG/ML) 0.5% IN NEBU: 2.5000 mg | INHALATION_SOLUTION | RESPIRATORY_TRACT | Status: DC | PRN

## 2013-05-13 MED ORDER — ACETAMINOPHEN 325 MG PO TABS: 650.0000 mg | ORAL_TABLET | Freq: Four times a day (QID) | ORAL | Status: DC | PRN

## 2013-05-13 MED ORDER — SODIUM CHLORIDE 0.9 % IV SOLN
INTRAVENOUS | Status: DC
  Administered 2013-05-13 – 2013-05-19 (×8): via INTRAVENOUS

## 2013-05-13 NOTE — H&P (Signed)
[facility] History and Physical  Mike Mack ZOX:[id number] DOB: 02-02-1936 DOA: 05/13/2013  Referring physician: Dr Patria Mane PCP: Michiel Sites, MD  Specialists:   Chief Complaint: Nausea, emesis, abdominal pain  HPI: Mike Mack is a 77 y.o. male  With history of hypertension, diabetes mellitus, prior history of small bowel obstruction, BPH, history of stroke presented to the ED with 3 to four-day history of right upper quadrant pain, abdominal distention, chronic constipation, generalized weakness, nausea vomiting. Patient was seen in the ED and noted to be febrile on a 1.3. Patient also with complaints of right flank pain. Patient denies any fevers, no chills, no direct, no chest pain, no shortness of breath, no paroxysmal nocturnal dyspnea, no orthopnea, no cough, no dysuria, no dysphagia, no reflux, no melena, no hematemesis, no hematochezia, no claudication. Patient had presented to the ED several days ago for back pain and diagnosed with acute back pain. Urinalysis which was done then is growing greater then 100,000 enterococci. Over the past 24 hours patient has had worsening of her symptoms including worsening abdominal distention, worsening right upper quadrant pain, nausea and emesis. Patient was seen in the emergency room comparison metabolic profile and a sodium of 132 chloride of 95 creatinine 1.45 albumin of 3.3 otherwise was within normal limits. Lipase was 9. CBC had a white count of 18.9 hemoglobin of 10.6 platelet count of 236. Urinalysis which was repeated was nitrite negative trace leukocytes 0-2 WBCs few bacteria. Acute abdominal series obtained showed bowel gas pattern consistent with constipation. Minimal distention of small bowel loops with gas is demonstrated as well. Chest film revealing small left pleural effusion and mild prominence of the pulmonary interstitium and pulmonary vascularity. CT of the abdomen and pelvis which was done showed a large amount  of stool present within the colon consistent with severe constipation. No annular constricting lesion or other obstructive process is demonstrated. Distention of the distal small bowel loops has developed. Distention of the gallbladder is more prominent and there is mild gallbladder wall thickening which would reflect development of a calculus cholecystitis. There is new bibasilar atelectasis versus early pneumonia in the lower lobes posteriorly. Small bilateral pleural effusions have developed. No evidence of urinary tract obstruction or acute inflammation. ED physician consulted Gen. surgery. We were called to admit the patient for further evaluation and management.  Review of Systems: The patient denies anorexia, fever, weight loss,, vision loss, decreased hearing, hoarseness, chest pain, syncope, dyspnea on exertion, peripheral edema, balance deficits, hemoptysis, abdominal pain, melena, hematochezia, severe indigestion/heartburn, hematuria, incontinence, genital sores, muscle weakness, suspicious skin lesions, transient blindness, difficulty walking, depression, unusual weight change, abnormal bleeding, enlarged lymph nodes, angioedema, and breast masses.   Past Medical History  Diagnosis Date  . Diabetes mellitus without complication   . Hypertension   . Small bowel obstruction   . BPH (benign prostatic hyperplasia)   . Neuropathy   . Stroke   . Polio   . Polio Childhood  . Arthritis   . Constipation 05/13/2013  . Anemia 05/13/2013   Past Surgical History  Procedure Laterality Date  . Bowel resection    . Hernia repair    . Cataract extraction w/ intraocular lens  implant, bilateral     Social History:  reports that he has never smoked. He has never used smokeless tobacco. He reports that he does not drink alcohol or use illicit drugs.  No Known Allergies  Family History  Problem Relation Age of Onset  . Diabetes Sister   .  Diabetes Sister   . Diabetes Brother   . Hypertension  Sister   . Hypertension Sister   . Hypertension Brother   . Heart failure Mother   . Heart attack Sister     Prior to Admission medications   Medication Sig Start Date End Date Taking? Authorizing Provider  amLODipine (NORVASC) 5 MG tablet Take 5 mg by mouth every morning.    Yes Historical Provider, MD  aspirin 325 MG tablet Take 1 tablet (325 mg total) by mouth daily. 04/19/12  Yes Daniel J Angiulli, PA-C  atorvastatin (LIPITOR) 20 MG tablet Take 1 tablet by mouth every evening. 05/02/13  Yes Historical Provider, MD  gabapentin (NEURONTIN) 800 MG tablet Take 1 tablet by mouth 3 (three) times daily as needed (HEADACHES).  05/09/13  Yes Historical Provider, MD  insulin detemir (LEVEMIR) 100 UNIT/ML injection Inject 50 Units into the skin 2 (two) times daily.    Yes Historical Provider, MD  irbesartan (AVAPRO) 300 MG tablet Take 300 mg by mouth at bedtime.   Yes Historical Provider, MD  metFORMIN (GLUMETZA) 1000 MG (MOD) 24 hr tablet Take 1,000 mg by mouth 2 (two) times daily with a meal.   Yes Historical Provider, MD  mineral oil liquid Take 15 mLs by mouth daily as needed for moderate constipation.   Yes Historical Provider, MD  polyethylene glycol (MIRALAX / GLYCOLAX) packet Take 17 g by mouth every morning.   Yes Historical Provider, MD  predniSONE (DELTASONE) 1 MG tablet Take 6 mg by mouth every morning. Take with 5 mg as directed. Total daily dose 6mg    Yes Historical Provider, MD  predniSONE (DELTASONE) 5 MG tablet Take 6 mg by mouth daily with breakfast. Take with 1mg  tablet as directed. Total daily dose 6mg    Yes Historical Provider, MD  solifenacin (VESICARE) 10 MG tablet Take 10 mg by mouth every evening.   Yes Historical Provider, MD  Tamsulosin HCl (FLOMAX) 0.4 MG CAPS Take 0.4 mg by mouth 2 (two) times daily.    Yes Historical Provider, MD  traMADol (ULTRAM) 50 MG tablet Take 1 tablet (50 mg total) by mouth every 6 (six) hours as needed. 05/10/13  Yes Olivia Mackie, MD  Travoprost,  BAK Free, (TRAVATAN) 0.004 % SOLN ophthalmic solution Place 1 drop into both eyes at bedtime.   Yes Historical Provider, MD   Physical Exam: Filed Vitals:   05/13/13 1130  BP: 153/65  Pulse: 100  Temp:   Resp: 15     General:  Laying on a gurney in no acute cardiopulmonary distress.  Eyes: Pupils equal round and reactive to light and accommodation. Extraocular movements intact.  ENT: Oropharynx is clear, no lesions, no exudates. Dry mucous membranes.  Neck: Supple no lymphadenopathy. No JVD.  Cardiovascular: Regular rate rhythm no murmurs rubs or gallops. No JVD. No lower extremity edema.  Respiratory: Clear to auscultation bilaterally. No wheezes, no crackles, no rhonchi.  Abdomen: Distended, tight, right CVA tenderness, right upper quadrant tenderness to palpation, decreased positive bowel sounds.  Skin: No rashes or lesions.  Musculoskeletal: 4/5 bilateral upper extremity strength. 4/5 bilateral lower extremity strength.  Psychiatric: Normal mood. Normal affect. Fair insight. Fair judgment.  Neurologic: P x3. Cranial nerves II through XII are grossly intact. Visual fields are intact. Sensation is intact. Gait not tested secondary to safety.   Labs on Admission:  Basic Metabolic Panel:  Recent Labs Lab 05/10/13 0055 05/13/13 0935  NA 134* 132*  K 4.7 4.2  CL 98 95*  CO2 26 26  GLUCOSE 186* 62*  BUN 17 22  CREATININE 1.09 1.45*  CALCIUM 9.3 9.5   Liver Function Tests:  Recent Labs Lab 05/13/13 0935  AST 26  ALT 16  ALKPHOS 87  BILITOT 0.9  PROT 7.5  ALBUMIN 3.3*    Recent Labs Lab 05/13/13 0935  LIPASE 9*   No results found for this basename: AMMONIA,  in the last 168 hours CBC:  Recent Labs Lab 05/10/13 0055 05/13/13 0935  WBC 7.6 18.9*  NEUTROABS 5.4 16.8*  HGB 11.5* 10.6*  HCT 35.3* 31.9*  MCV 91.2 89.4  PLT 247 236   Cardiac Enzymes: No results found for this basename: CKTOTAL, CKMB, CKMBINDEX, TROPONINI,  in the last 168  hours  BNP (last 3 results) No results found for this basename: PROBNP,  in the last 8760 hours CBG:  Recent Labs Lab 05/13/13 0921  GLUCAP 55*    Radiological Exams on Admission: Ct Abdomen Pelvis W Contrast  05/13/2013   CLINICAL DATA:  Nausea and vomiting and abdominal distention and pain with fever. There is history of small bowel obstruction.  EXAM: CT ABDOMEN AND PELVIS WITH CONTRAST  TECHNIQUE: Multidetector CT imaging of the abdomen and pelvis was performed using the standard protocol following bolus administration of intravenous contrast.  CONTRAST:  OMNIPAQUE IOHEXOL 300 MG/ML SOLN intravenously. The patient also received oral contrast material.  COMPARISON:  Acute abdominal series dated May 13, 2013 and CT scan of the abdomen and pelvis dated 10 May 2013  FINDINGS: The gallbladder is quite distended today. Mild gallbladder wall thickening is visible though there is no pericholecystic fluid. No stones are evident. The liver exhibits no focal mass nor ductal dilation. The pancreas, spleen, and partially distended stomach appear normal. The adrenal glands are normal in density and contour. The kidneys exhibit no evidence of obstruction. There is a nonobstructing 2 mm diameter lower pole stone on the right. There are numerous cortical hypodensities compatible with cysts. The perinephric fat is normal in appearance. The urinary bladder is partially distended and grossly normal. The enlarged prostate gland produces a prominent impression upon the urinary bladder base. There is no inguinal nor umbilical hernia. The psoas musculature is normal in appearance. The caliber of the abdominal aorta is normal. There is no periaortic or pericaval lymphadenopathy.  There is a large amount of stool in the ascending and transverse and proximal descending portions of the colon. This is similar in appearance to that demonstrated on the earlier study of 22 November. The mid and distal descending  colon exhibit a moderate amount of stool but are not quite as distended as the more proximal portions of the colon. No bowel wall thickening is demonstrated to suggest colitis. There is no evidence of an annular constricting lesion. There are now all loops of mildly distended distal small bowel in the lower abdomen and upper pelvis. The appendix is again demonstrated and contains an appendicolith, it but does not appear edematous nor is there surrounding inflammatory change. The appendix is demonstrated best on the coronal reconstructed images, but in the axial plane it is demonstrated on images 57 through 66 of series 2.  Atelectasis versus early pneumonia in both lower lobes has developed since the previous study. There are small bilateral pleural effusions as well. Again demonstrated is chronic atrophy of the right gluteal musculature. The lumbar vertebral bodies are preserved in height. The intervertebral disc space heights appear reasonably well maintained.  IMPRESSION: 1. A large amount of  stool is present within the colon as described. This pattern is similar to that seen on the CT scan of May 10, 2013 and is consistent with severe constipation. No annular constricting lesion or other obstructive process is demonstrated. There are no bowel wall changes to suggest colitis or diverticulitis. Distention of distal small bowel loops has developed since the earlier study. 2. Distention of the gallbladder is more prominent today and there is mild gallbladder wall thickening which may reflect development of acalculus cholecystitis. 3. There is new bibasilar atelectasis versus early pneumonia in the lower lobes posteriorly. Small bilateral pleural effusions have developed as well. 4. There is no evidence of urinary tract obstruction or acute inflammation. Renal cystic lesions are present bilaterally. Prostatic enlargement is again demonstrated.   Electronically Signed   By: David  [place]   On: 05/13/2013 11:43    US Abdomen Limited  05/13/2013   CLINICAL DATA:  Right upper quadrant abdominal discomfort and fever, distended gallbladder on recent CT scan  EXAM: LIMITED ABDOMEN ULTRASOUND FOR ASCITES  COMPARISON:  CT scan of the abdomen and pelvis of today's date.  FINDINGS: Gallbladder  The gallbladder is moderately distended. There are echogenic stones as well as echogenic bile present. The stones appear mobile. The gallbladder wall is mildly thickened at 4.7 mm. There is no pericholecystic fluid nor positive sonographic Murphy's sign.  Common bile duct  Diameter: 3.3 mm.  Liver  No focal lesion identified. Within normal limits in parenchymal echogenicity.  IVC  No abnormality visualized.  Pancreas  Bowel gas obscures the pancreas.  Spleen  Size and appearance within normal limits.  Right Kidney  Length: 9 cm. Echogenicity within normal limits. Multiple cysts are present with the largest lying in the midpole laterally measuring 1.8 x 2 x 1.7 cm.  Left Kidney  Length: 9.8 cm. Echogenicity within normal limits. A lower pole cyst on the left measures 2.3 x 2.1 x 1.9 cm.  Abdominal aorta  The abdominal aorta is obscured by bowel gas.  IMPRESSION: 1. The gallbladder is distended and exhibits wall thickening with multiple tiny stones as well as echogenic bile. There is no positive sonographic Murphy's sign or definite pericholecystic fluid. 2. No acute abnormality of the liver or common bile duct is demonstrated. The pancreas is obscured by bowel gas. 3. There are simple appearing cysts in both kidneys. There is no hydronephrosis.   Electronically Signed   By: David  [place]   On: 05/13/2013 12:54   Dg Abd Acute W/chest  05/13/2013   CLINICAL DATA:  Patient is complaining of abdominal pain and distension and has a history of small bowel obstruction.  EXAM: ACUTE ABDOMEN SERIES (ABDOMEN 2 VIEW & CHEST 1 VIEW)  COMPARISON:  CT scan of the abdomen and pelvis dated May 10, 2013.  FINDINGS: The lungs are mildly  hypoinflated. The interstitial markings are mildly increased but this is in part due to vascular crowding. There is a trace of blunting of the left lateral costophrenic angle. The cardiac silhouette is mildly enlarged and the central pulmonary vascularity is prominent. Within the abdomen there is a large amount of stool throughout the ascending and transverse and descending portions of the colon. Proximal to this there are a few loops of mildly distended gas-filled small bowel. These lie principally within the pelvis. There is no more than a small amount of gas in the rectum. No free extraluminal gas collections are demonstrated. No acute bony abnormality is demonstrated. There is chronic rotatory scoliosis of the  lumbar spine.  IMPRESSION: 1. The bowel gas pattern is consistent with constipation. Minimal distention of small bowel loops with gas is demonstrated as well. 2. The chest film reveals a small left pleural effusion and mild prominence of the pulmonary interstitium and pulmonary vascularity suggesting low-grade CHF.   Electronically Signed   By: David  [place]   On: 05/13/2013 11:12    EKG: Independently reviewed. Sinus tachycardia.  Assessment/Plan Principal Problem:   Acute cholecystitis: Probable Active Problems:   Diabetes mellitus without complication   Hypertension   BPH (benign prostatic hyperplasia)   Stroke   Post-polio syndrome   RUQ abdominal pain   Constipation   Enterococcus UTI   Pyelonephritis: Probable   Leukocytosis   Hypoxia   Anemia   #1 probable acute cholecystitis Patient presenting with temp of 101.3, right upper quadrant tenderness, nausea, vomiting, noted to have a leukocytosis. CT of the abdomen and pelvis with gallbladder distention more prominent and mild gallbladder wall thickening which may reflect development of a calculus cholecystitis. Will check blood cultures x2. Place on IV Zosyn. IV fluids. Keep n.p.o. Supportive care. In general surgical  consultation pending.  #2 enterococcus UTI/probable pyelonephritis Patient presented with some complaints of a right CVA tenderness. Urinalysis which was done recently from prior ED visit on 05/10/2013 with urine cultures growing enterococcus UTI. Sensitive to ampicillin. Place on IV Zosyn. Follow.  #3 severe constipation Patient does have some abdominal distention which is tight. We'll probably likely need a G-tube. May consider enemas. Will defer to Gen. surgery.  #4 leukocytosis Likely secondary to problems #1 and 2. CT of the chest with some bibasilar infiltrates worse of atelectasis versus pneumonia. Patient with no respiratory symptoms. Patient with no cough. Will place empirically on IV Zosyn secondary to problems #1 and 2. Follow.  #5 hypoxia Questionable etiology. Likely secondary to abdominal distention portion of the diaphragm. Patient with no respiratory symptoms. CT of the abdomen and pelvis with some considerable bibasilar atelectasis versus pneumonia. However the patient does have a pneumonia. Follow for now.  #6 right upper quadrant abdominal pain Likely secondary to problem #1. General surgery follow.  #7 anemia Will check an anemia panel. Follow H&H.  #8 history of stroke Continue aspirin for secondary prevention.  #9 diabetes mellitus Check a hemoglobin A1c. Place on sliding scale insulin.  #10 hypertension Continue home regimen.  #11 BPH Continue Flomax.  #12 prophylaxis Lovenox for DVT prophylaxis.   Code Status: Full Family Communication: Updated patient and wife at bedside.  Disposition Plan: Admit to Med Surg  Time spent: 70 mins  [facility] MD [facility] Pager [phone]  If 7PM-7AM, please contact night-coverage [email or web address] Password [email or web address] 05/13/2013, 1:15 PM

## 2013-05-13 NOTE — Procedures (Signed)
Placement of 10 French percutaneous cholecystostomy with [place] and fluoro guidance.  No immediate complication.  Sample of bile sent for gram stain and culture.

## 2013-05-13 NOTE — Progress Notes (Signed)
ANTIBIOTIC CONSULT NOTE - INITIAL  Pharmacy Consult for Zosyn Indication: Probable cholecysitis and enterococcus UTI  No Known Allergies  Patient Measurements: Height: 5' (152.4 cm) Weight: 152 lb 5.4 oz (69.1 kg) IBW/kg (Calculated) : 50 Adjusted Body Weight:   Vital Signs: Temp: 100 F (37.8 C) (11/25 1420) Temp src: Oral (11/25 1420) BP: 165/78 mmHg (11/25 1420) Pulse Rate: 113 (11/25 1420) Intake/Output from previous day:   Intake/Output from this shift:    Labs:  Recent Labs  05/13/13 0935  WBC 18.9*  HGB 10.6*  PLT 236  CREATININE 1.45*   Estimated Creatinine Clearance: 36.4 ml/min (by C-G formula based on Cr of 1.45). No results found for this basename: VANCOTROUGH, Leodis Binet, VANCORANDOM, GENTTROUGH, GENTPEAK, GENTRANDOM, TOBRATROUGH, TOBRAPEAK, TOBRARND, AMIKACINPEAK, AMIKACINTROU, AMIKACIN,  in the last 72 hours   Microbiology: Recent Results (from the past 720 hour(s))  URINE CULTURE     Status: None   Collection Time    05/10/13  1:27 AM      Result Value Range Status   Specimen Description URINE, RANDOM   Final   Special Requests NONE   Final   Culture  Setup Time     Final   Value: 05/10/2013 14:29     Performed at [facility] Count     Final   Value: >=100,000 COLONIES/ML     Performed at [facility]   Culture     Final   Value: ENTEROCOCCUS SPECIES     Performed at [facility]   Report Status 05/12/2013 FINAL   Final   Organism ID, Bacteria ENTEROCOCCUS SPECIES   Final    Medical History: Past Medical History  Diagnosis Date  . Diabetes mellitus without complication   . Hypertension   . Small bowel obstruction   . BPH (benign prostatic hyperplasia)   . Neuropathy   . Stroke   . Polio   . Polio Childhood  . Arthritis   . Constipation 05/13/2013  . Anemia 05/13/2013    Medications:  Scheduled:  . amLODipine  5 mg Oral q morning - 10a  . aspirin  325 mg Oral Daily  . darifenacin  15 mg Oral  Daily  . enoxaparin (LOVENOX) injection  40 mg Subcutaneous Q24H  . insulin aspart  0-9 Units Subcutaneous TID WC  . irbesartan  300 mg Oral QHS  . latanoprost  1 drop Both Eyes QHS  . milk and molasses   Rectal Once  . [START ON 05/14/2013] milk and molasses   Rectal Once  . pantoprazole (PROTONIX) IV  40 mg Intravenous Q24H  . predniSONE  6 mg Oral Q breakfast  . tamsulosin  0.4 mg Oral BID   Infusions:  . sodium chloride 75 mL/hr at 05/13/13 1536   PRN: acetaminophen, acetaminophen, albuterol, alum & mag hydroxide-simeth, gabapentin, morphine injection, ondansetron (ZOFRAN) IV, ondansetron Assessment: 77 yo M with abdominal distention, probable cholecystitis and enterococcus UTI Goal of Therapy:  Eradication of infection; appropriate antibiotic treatment  Plan:  Zosyn 3.375 Gm IV q 8 hours ( Infuse over 4 hours) Follow up culture results  Loletta Specter 05/13/2013,3:54 PM

## 2013-05-13 NOTE — Consult Note (Signed)
General surgery attending note:  I have interviewed and examined this patient this afternoon. I reviewed his CT scan with radiology. I have discussed the treatment plan with the patient and his wife.  Abdominal exam reveals that he is tender primarily in the right upper quadrant. I do not appreciate a mass but he is distended. He is a significantly deconditioned and feeble man.   Assessment/Plan: 1)   I think that his primary problem today is acute cholecystitis with cholelithiasis. His abdomen is massively distended, primarily due to pan colonic fecal impaction. There is also some small bowel distention this is probably secondary to the constipation rather than primary small bowel obstruction.  I believe that it would be extremely difficult and probably impossible to perform a laparoscopic cholecystectomy at this point in time due to his abdominal distention. I think that an open procedure would be extremely morbid, and I think that he would most likely require discharge to a skilled nursing facility following that.  I discussed the option of immediate surgical intervention versus percutaneous cholecystostomy and interval cholecystectomy 6-8 weeks from now with his constipation resolved and his general condition stabilized. We looked at the pros and cons of this and the patient and especially his wife like that idea better. This was discussed with Dr. Bunnie Pion in radiology, and he is going to proceed with percutaneous cholecystostomy now.  2)   Diabetes mellitus, insulin-dependent 3)  Hypertension 4)   History exploratoty laparotomy and lysis of adhesions for small bowel obstruction. (06/28/2008 - Dr. Gerrit Friends) 5)   History right inguinal hernia repair 6)   Peripheral neuropathy 7)   Cerebrovascular accident October 2013 8)   Polio as a child 9)   Arthritis on chronic steroids 10)   Recent enterococcal UTI)

## 2013-05-13 NOTE — ED Notes (Signed)
Pt vomited, brown fluid. Linens and gown changed. Pt has extreme difficulty ambulating.pt reports pt has always had some difficulty ambulating due to childhood polio and stroke, but that weakness and difficulty has increased in last few days.

## 2013-05-13 NOTE — Progress Notes (Signed)
Cholecystostomy tube in RUQ intact, drained 250 cc of maroon colored output,

## 2013-05-13 NOTE — ED Notes (Signed)
Report given to [facility], RN on floor

## 2013-05-13 NOTE — ED Notes (Signed)
Pt to xray, will start abx and give pain medication when pt returns.

## 2013-05-13 NOTE — ED Notes (Signed)
Pt to CT

## 2013-05-13 NOTE — ED Notes (Signed)
Pt states that he has been weak, vomiting and less coherent since he was here on Friday for infection. Pt needed assistance getting out of the car and presents with coffee brown emesis stains.

## 2013-05-13 NOTE — Progress Notes (Signed)
   CARE MANAGEMENT ED NOTE 05/13/2013  Patient:  Mike Mack, Mike Mack   Account Number:  [id number]  Date Initiated:  05/13/2013  Documentation initiated by:  Melinda Crutch  Subjective/Objective Assessment:   42 male came to ED with nausea, emesis, right upper quadrant  abdominal pain, elevated temp. Member diag: acute cholecystitis.  Admitted to acute inpatient. Medications administered for pain, IV meds Zosyn and IV fluids given. Member NPO.     Subjective/Objective Assessment Detail:   Member to be admitted to  inpatient on 3 east.     Action/Plan:   Utilization review completed.   Action/Plan Detail:   Anticipated DC Date:  05/16/2013     Status Recommendation to Physician:   Result of Recommendation:    Other ED Services  Consult Working Plan    DC Planning Services  Other  CM consult    Choice offered to / List presented to:            Status of service:  Completed, signed off  ED Comments:   ED Comments Detail:

## 2013-05-13 NOTE — Progress Notes (Signed)
Patient ID: Mike Mack, male   DOB: 07/15/1935, 77 y.o.   MRN: [id number] Request received from [facility] for perc cholecystostomy in pt with history of RUQ abd pain,N/V, constipation, fever, leukocytosis, prominent GB distension/wall thickening on imaging. Studies were reviewed by Dr. Lowella Dandy. Pt also with recently diagnosed enterococcal UTI. Additional PMH as below.  Exam: pt awake/alert; chest- dim BS bases, few exp rhonchi; heart- RRR; abd- dist/taut, tender RUQ, few BS; ext- no edema; muscle wasting RLE sec to hx polio, diminished strength RUE/RLE(prior CVA).   Filed Vitals:   05/13/13 0940 05/13/13 1015 05/13/13 1130 05/13/13 1300  BP:   153/65 136/55  Pulse:  103 100 95  Temp:    100.3 F (37.9 C)  TempSrc:    Oral  Resp:   15 15  SpO2: 96%  93% 95%   Past Medical History  Diagnosis Date  . Diabetes mellitus without complication   . Hypertension   . Small bowel obstruction   . BPH (benign prostatic hyperplasia)   . Neuropathy   . Stroke   . Polio   . Polio Childhood  . Arthritis   . Constipation 05/13/2013  . Anemia 05/13/2013   Past Surgical History  Procedure Laterality Date  . Bowel resection    . Hernia repair    . Cataract extraction w/ intraocular lens  implant, bilateral    Ct Abdomen Pelvis W Contrast  05/13/2013   CLINICAL DATA:  Nausea and vomiting and abdominal distention and pain with fever. There is history of small bowel obstruction.  EXAM: CT ABDOMEN AND PELVIS WITH CONTRAST  TECHNIQUE: Multidetector CT imaging of the abdomen and pelvis was performed using the standard protocol following bolus administration of intravenous contrast.  CONTRAST:  OMNIPAQUE IOHEXOL 300 MG/ML SOLN intravenously. The patient also received oral contrast material.  COMPARISON:  Acute abdominal series dated May 13, 2013 and CT scan of the abdomen and pelvis dated 10 May 2013  FINDINGS: The gallbladder is quite distended today. Mild gallbladder wall thickening is visible though  there is no pericholecystic fluid. No stones are evident. The liver exhibits no focal mass nor ductal dilation. The pancreas, spleen, and partially distended stomach appear normal. The adrenal glands are normal in density and contour. The kidneys exhibit no evidence of obstruction. There is a nonobstructing 2 mm diameter lower pole stone on the right. There are numerous cortical hypodensities compatible with cysts. The perinephric fat is normal in appearance. The urinary bladder is partially distended and grossly normal. The enlarged prostate gland produces a prominent impression upon the urinary bladder base. There is no inguinal nor umbilical hernia. The psoas musculature is normal in appearance. The caliber of the abdominal aorta is normal. There is no periaortic or pericaval lymphadenopathy.  There is a large amount of stool in the ascending and transverse and proximal descending portions of the colon. This is similar in appearance to that demonstrated on the earlier study of 22 November. The mid and distal descending colon exhibit a moderate amount of stool but are not quite as distended as the more proximal portions of the colon. No bowel wall thickening is demonstrated to suggest colitis. There is no evidence of an annular constricting lesion. There are now all loops of mildly distended distal small bowel in the lower abdomen and upper pelvis. The appendix is again demonstrated and contains an appendicolith, it but does not appear edematous nor is there surrounding inflammatory change. The appendix is demonstrated best on the coronal reconstructed images,  but in the axial plane it is demonstrated on images 57 through 66 of series 2.  Atelectasis versus early pneumonia in both lower lobes has developed since the previous study. There are small bilateral pleural effusions as well. Again demonstrated is chronic atrophy of the right gluteal musculature. The lumbar vertebral bodies are preserved in height. The  intervertebral disc space heights appear reasonably well maintained.  IMPRESSION: 1. A large amount of stool is present within the colon as described. This pattern is similar to that seen on the CT scan of May 10, 2013 and is consistent with severe constipation. No annular constricting lesion or other obstructive process is demonstrated. There are no bowel wall changes to suggest colitis or diverticulitis. Distention of distal small bowel loops has developed since the earlier study. 2. Distention of the gallbladder is more prominent today and there is mild gallbladder wall thickening which may reflect development of acalculus cholecystitis. 3. There is new bibasilar atelectasis versus early pneumonia in the lower lobes posteriorly. Small bilateral pleural effusions have developed as well. 4. There is no evidence of urinary tract obstruction or acute inflammation. Renal cystic lesions are present bilaterally. Prostatic enlargement is again demonstrated.   Electronically Signed   By: David  [place]   On: 05/13/2013 11:43   Ct Abdomen Pelvis W Contrast  05/10/2013   CLINICAL DATA:  Left flank pain, nontraumatic.  EXAM: CT ABDOMEN AND PELVIS WITH CONTRAST  TECHNIQUE: Multidetector CT imaging of the abdomen and pelvis was performed using the standard protocol following bolus administration of intravenous contrast.  CONTRAST:  OMNIPAQUE IOHEXOL 300 MG/ML  SOLN  COMPARISON:  02/28/2013  FINDINGS: BODY WALL: Unremarkable.  LOWER CHEST: Trace bilateral pleural effusions.  ABDOMEN/PELVIS:  Liver: Hypervascular lesion in segment 7 of the liver measures approximately 1 cm. This and a lower subcapsular low-attenuation lesion appears stable dating back to 2006.  Biliary: Gallbladder distention without visible stone or pericholecystic edema.  Pancreas: Unremarkable.  Spleen: Nonspecific tiny low-attenuation lesion in the upper spleen.  Adrenals: Unremarkable.  Kidneys and ureters: Bilateral low dense cortical  lesions. Those large enough to evaluate appear simple, cystic. No hydronephrosis. 2 mm stone in the lower pole right kidney.  Bladder: Unremarkable  Reproductive:Moderate prostate enlargement deforming the bladder base, associated with a calcification in the upper right gland.  Bowel: There is a moderate volume of formed stool with in the proximal colon, but the distal colon is decompressed. No bowel obstruction. The appendix is mildly dilated at 7 mm, and contains high-density material at its tip, but is predominantly gas filled. No periappendiceal stranding.  Retroperitoneum: No mass or adenopathy.  Peritoneum: Dystrophic calcifications in the in the rectoprostatic recess.  Vascular: No acute abnormality.  OSSEOUS: Atrophy of the anterior right thigh musculature.  IMPRESSION: 1. No findings to explain left flank pain. 2. Nonobstructive right nephrolithiasis. 3. Trace bilateral pleural effusions.   Electronically Signed   By: Tiburcio Pea M.D.   On: 05/10/2013 03:45   US Abdomen Limited  05/13/2013   CLINICAL DATA:  Right upper quadrant abdominal discomfort and fever, distended gallbladder on recent CT scan  EXAM: LIMITED ABDOMEN ULTRASOUND FOR ASCITES  COMPARISON:  CT scan of the abdomen and pelvis of today's date.  FINDINGS: Gallbladder  The gallbladder is moderately distended. There are echogenic stones as well as echogenic bile present. The stones appear mobile. The gallbladder wall is mildly thickened at 4.7 mm. There is no pericholecystic fluid nor positive sonographic Murphy's sign.  Common bile duct  Diameter: 3.3 mm.  Liver  No focal lesion identified. Within normal limits in parenchymal echogenicity.  IVC  No abnormality visualized.  Pancreas  Bowel gas obscures the pancreas.  Spleen  Size and appearance within normal limits.  Right Kidney  Length: 9 cm. Echogenicity within normal limits. Multiple cysts are present with the largest lying in the midpole laterally measuring 1.8 x 2 x 1.7 cm.  Left  Kidney  Length: 9.8 cm. Echogenicity within normal limits. A lower pole cyst on the left measures 2.3 x 2.1 x 1.9 cm.  Abdominal aorta  The abdominal aorta is obscured by bowel gas.  IMPRESSION: 1. The gallbladder is distended and exhibits wall thickening with multiple tiny stones as well as echogenic bile. There is no positive sonographic Murphy's sign or definite pericholecystic fluid. 2. No acute abnormality of the liver or common bile duct is demonstrated. The pancreas is obscured by bowel gas. 3. There are simple appearing cysts in both kidneys. There is no hydronephrosis.   Electronically Signed   By: David  [place]   On: 05/13/2013 12:54   Dg Abd Acute W/chest  05/13/2013   CLINICAL DATA:  Patient is complaining of abdominal pain and distension and has a history of small bowel obstruction.  EXAM: ACUTE ABDOMEN SERIES (ABDOMEN 2 VIEW & CHEST 1 VIEW)  COMPARISON:  CT scan of the abdomen and pelvis dated May 10, 2013.  FINDINGS: The lungs are mildly hypoinflated. The interstitial markings are mildly increased but this is in part due to vascular crowding. There is a trace of blunting of the left lateral costophrenic angle. The cardiac silhouette is mildly enlarged and the central pulmonary vascularity is prominent. Within the abdomen there is a large amount of stool throughout the ascending and transverse and descending portions of the colon. Proximal to this there are a few loops of mildly distended gas-filled small bowel. These lie principally within the pelvis. There is no more than a small amount of gas in the rectum. No free extraluminal gas collections are demonstrated. No acute bony abnormality is demonstrated. There is chronic rotatory scoliosis of the lumbar spine.  IMPRESSION: 1. The bowel gas pattern is consistent with constipation. Minimal distention of small bowel loops with gas is demonstrated as well. 2. The chest film reveals a small left pleural effusion and mild prominence of the  pulmonary interstitium and pulmonary vascularity suggesting low-grade CHF.   Electronically Signed   By: David  [place]   On: 05/13/2013 11:12  Results for orders placed during the hospital encounter of 05/13/13  GLUCOSE, CAPILLARY      Result Value Range   Glucose-Capillary 55 (*) 70 - 99 mg/dL   Comment 1 Notify RN    CBC WITH DIFFERENTIAL      Result Value Range   WBC 18.9 (*) 4.0 - 10.5 K/uL   RBC 3.57 (*) 4.22 - 5.81 MIL/uL   Hemoglobin 10.6 (*) 13.0 - 17.0 g/dL   HCT 95.6 (*) 21.3 - 08.6 %   MCV 89.4  78.0 - 100.0 fL   MCH 29.7  26.0 - 34.0 pg   MCHC 33.2  30.0 - 36.0 g/dL   RDW 57.8  46.9 - 62.9 %   Platelets 236  150 - 400 K/uL   Neutrophils Relative % 89 (*) 43 - 77 %   Neutro Abs 16.8 (*) 1.7 - 7.7 K/uL   Lymphocytes Relative 4 (*) 12 - 46 %   Lymphs Abs 0.8  0.7 - 4.0 K/uL  Monocytes Relative 6  3 - 12 %   Monocytes Absolute 1.2 (*) 0.1 - 1.0 K/uL   Eosinophils Relative 0  0 - 5 %   Eosinophils Absolute 0.0  0.0 - 0.7 K/uL   Basophils Relative 0  0 - 1 %   Basophils Absolute 0.0  0.0 - 0.1 K/uL  COMPREHENSIVE METABOLIC PANEL      Result Value Range   Sodium 132 (*) 135 - 145 mEq/L   Potassium 4.2  3.5 - 5.1 mEq/L   Chloride 95 (*) 96 - 112 mEq/L   CO2 26  19 - 32 mEq/L   Glucose, Bld 62 (*) 70 - 99 mg/dL   BUN 22  6 - 23 mg/dL   Creatinine, Ser 1.61 (*) 0.50 - 1.35 mg/dL   Calcium 9.5  8.4 - 09.6 mg/dL   Total Protein 7.5  6.0 - 8.3 g/dL   Albumin 3.3 (*) 3.5 - 5.2 g/dL   AST 26  0 - 37 U/L   ALT 16  0 - 53 U/L   Alkaline Phosphatase 87  39 - 117 U/L   Total Bilirubin 0.9  0.3 - 1.2 mg/dL   GFR calc non Af Amer 46 (*) >90 mL/min   GFR calc Af Amer 53 (*) >90 mL/min  LIPASE, BLOOD      Result Value Range   Lipase 9 (*) 11 - 59 U/L  URINALYSIS, ROUTINE W REFLEX MICROSCOPIC      Result Value Range   Color, Urine AMBER (*) YELLOW   APPearance CLOUDY (*) CLEAR   Specific Gravity, Urine 1.024  1.005 - 1.030   pH 7.5  5.0 - 8.0   Glucose, UA 250 (*) NEGATIVE  mg/dL   Hgb urine dipstick NEGATIVE  NEGATIVE   Bilirubin Urine NEGATIVE  NEGATIVE   Ketones, ur NEGATIVE  NEGATIVE mg/dL   Protein, ur 045 (*) NEGATIVE mg/dL   Urobilinogen, UA 1.0  0.0 - 1.0 mg/dL   Nitrite NEGATIVE  NEGATIVE   Leukocytes, UA TRACE (*) NEGATIVE  URINE MICROSCOPIC-ADD ON      Result Value Range   WBC, UA 0-2  <3 WBC/hpf   Bacteria, UA FEW (*) RARE   A/P:Pt with history of RUQ abd pain, N/V, constipation, fever, leukocytosis, prominent GB distension/wall thickening on imaging and recently diagnosed enterococcal UTI. Plan is for perc cholecystostomy today. Details/risks of procedure d/w pt/wife with their understanding and consent.

## 2013-05-13 NOTE — ED Provider Notes (Addendum)
CSN: [id number]     Arrival date & time 05/13/13  9147 History   First MD Initiated Contact with Patient 05/13/13 0912     Chief Complaint  Patient presents with  . Emesis  . Weakness    The history is provided by the patient, the spouse and medical records.   patient presents emergency Department with to 3 days of nausea vomiting and now complains of right upper quadrant abdominal pain.  He presents the emergency department with a fever of 101.3.  There's been no reported confusion over the past 12-18 hours.  Patient denies flank pain.  He was in the emergency apartment several days ago for some back pain and was diagnosed with acute back pain.  His urine culture from that visit appears to be growing out greater than 100,000 enterococcus sensitive to nitrofurantoin and vancomycin.  Patient is a diabetic.  No reported diarrhea.  No rash.  Patient has a history of small bowel obstruction in the past and feels as though his abdomen is more distended over the past 24 hours.  Denies hematemesis  Past Medical History  Diagnosis Date  . Diabetes mellitus without complication   . Hypertension   . Small bowel obstruction   . BPH (benign prostatic hyperplasia)   . Neuropathy   . Stroke   . Polio   . Polio Childhood  . Arthritis    Past Surgical History  Procedure Laterality Date  . Bowel resection    . Hernia repair    . Cataract extraction w/ intraocular lens  implant, bilateral     Family History  Problem Relation Age of Onset  . Diabetes Sister   . Diabetes Sister   . Diabetes Brother   . Hypertension Sister   . Hypertension Sister   . Hypertension Brother   . Heart failure Mother   . Heart attack Sister    History  Substance Use Topics  . Smoking status: Never Smoker   . Smokeless tobacco: Never Used  . Alcohol Use: No    Review of Systems  All other systems reviewed and are negative.    Allergies  Review of patient's allergies indicates no known allergies.  Home  Medications   Current Outpatient Rx  Name  Route  Sig  Dispense  Refill  . amLODipine (NORVASC) 5 MG tablet   Oral   Take 5 mg by mouth every morning.          Marland Kitchen aspirin 325 MG tablet   Oral   Take 1 tablet (325 mg total) by mouth daily.         Marland Kitchen atorvastatin (LIPITOR) 20 MG tablet   Oral   Take 1 tablet by mouth every evening.         . gabapentin (NEURONTIN) 800 MG tablet   Oral   Take 1 tablet by mouth 3 (three) times daily as needed (HEADACHES).          . insulin detemir (LEVEMIR) 100 UNIT/ML injection   Subcutaneous   Inject 50 Units into the skin 2 (two) times daily.          . irbesartan (AVAPRO) 300 MG tablet   Oral   Take 300 mg by mouth at bedtime.         . metFORMIN (GLUMETZA) 1000 MG (MOD) 24 hr tablet   Oral   Take 1,000 mg by mouth 2 (two) times daily with a meal.         . mineral  oil liquid   Oral   Take 15 mLs by mouth daily as needed for moderate constipation.         . polyethylene glycol (MIRALAX / GLYCOLAX) packet   Oral   Take 17 g by mouth every morning.         . predniSONE (DELTASONE) 1 MG tablet   Oral   Take 6 mg by mouth every morning. Take with 5 mg as directed. Total daily dose 6mg          . predniSONE (DELTASONE) 5 MG tablet   Oral   Take 6 mg by mouth daily with breakfast. Take with 1mg  tablet as directed. Total daily dose 6mg          . solifenacin (VESICARE) 10 MG tablet   Oral   Take 10 mg by mouth every evening.         . Tamsulosin HCl (FLOMAX) 0.4 MG CAPS   Oral   Take 0.4 mg by mouth 2 (two) times daily.          . traMADol (ULTRAM) 50 MG tablet   Oral   Take 1 tablet (50 mg total) by mouth every 6 (six) hours as needed.   15 tablet   0   . Travoprost, BAK Free, (TRAVATAN) 0.004 % SOLN ophthalmic solution   Both Eyes   Place 1 drop into both eyes at bedtime.          BP 153/65  Pulse 100  Temp(Src) 101.3 F (38.5 C) (Oral)  Resp 15  SpO2 93% Physical Exam  Nursing note and  vitals reviewed. Constitutional: He is oriented to person, place, and time. He appears well-developed and well-nourished.  HENT:  Head: Normocephalic and atraumatic.  Eyes: EOM are normal.  Neck: Normal range of motion.  Cardiovascular: Normal rate, regular rhythm, normal heart sounds and intact distal pulses.   Pulmonary/Chest: Effort normal and breath sounds normal. No respiratory distress.  Abdominal: Soft. He exhibits distension.  Generalized abdominal tenderness in the right upper quadrant without focality.  No rash.  Musculoskeletal: Normal range of motion.  Neurological: He is alert and oriented to person, place, and time.  Skin: Skin is warm and dry.  Psychiatric: He has a normal mood and affect. Judgment normal.    ED Course  Procedures (including critical care time) Labs Review Labs Reviewed  GLUCOSE, CAPILLARY - Abnormal; Notable for the following:    Glucose-Capillary 55 (*)    All other components within normal limits  CBC WITH DIFFERENTIAL - Abnormal; Notable for the following:    WBC 18.9 (*)    RBC 3.57 (*)    Hemoglobin 10.6 (*)    HCT 31.9 (*)    Neutrophils Relative % 89 (*)    Neutro Abs 16.8 (*)    Lymphocytes Relative 4 (*)    Monocytes Absolute 1.2 (*)    All other components within normal limits  COMPREHENSIVE METABOLIC PANEL - Abnormal; Notable for the following:    Sodium 132 (*)    Chloride 95 (*)    Glucose, Bld 62 (*)    Creatinine, Ser 1.45 (*)    Albumin 3.3 (*)    GFR calc non Af Amer 46 (*)    GFR calc Af Amer 53 (*)    All other components within normal limits  LIPASE, BLOOD - Abnormal; Notable for the following:    Lipase 9 (*)    All other components within normal limits  URINALYSIS, ROUTINE W REFLEX MICROSCOPIC -  Abnormal; Notable for the following:    Color, Urine AMBER (*)    APPearance CLOUDY (*)    Glucose, UA 250 (*)    Protein, ur 100 (*)    Leukocytes, UA TRACE (*)    All other components within normal limits  URINE  MICROSCOPIC-ADD ON - Abnormal; Notable for the following:    Bacteria, UA FEW (*)    All other components within normal limits  PH, GASTRIC FLUID (GASTROCCULT CARD)   Imaging Review Ct Abdomen Pelvis W Contrast  05/13/2013   CLINICAL DATA:  Nausea and vomiting and abdominal distention and pain with fever. There is history of small bowel obstruction.  EXAM: CT ABDOMEN AND PELVIS WITH CONTRAST  TECHNIQUE: Multidetector CT imaging of the abdomen and pelvis was performed using the standard protocol following bolus administration of intravenous contrast.  CONTRAST:  OMNIPAQUE IOHEXOL 300 MG/ML SOLN intravenously. The patient also received oral contrast material.  COMPARISON:  Acute abdominal series dated May 13, 2013 and CT scan of the abdomen and pelvis dated 10 May 2013  FINDINGS: The gallbladder is quite distended today. Mild gallbladder wall thickening is visible though there is no pericholecystic fluid. No stones are evident. The liver exhibits no focal mass nor ductal dilation. The pancreas, spleen, and partially distended stomach appear normal. The adrenal glands are normal in density and contour. The kidneys exhibit no evidence of obstruction. There is a nonobstructing 2 mm diameter lower pole stone on the right. There are numerous cortical hypodensities compatible with cysts. The perinephric fat is normal in appearance. The urinary bladder is partially distended and grossly normal. The enlarged prostate gland produces a prominent impression upon the urinary bladder base. There is no inguinal nor umbilical hernia. The psoas musculature is normal in appearance. The caliber of the abdominal aorta is normal. There is no periaortic or pericaval lymphadenopathy.  There is a large amount of stool in the ascending and transverse and proximal descending portions of the colon. This is similar in appearance to that demonstrated on the earlier study of 22 November. The mid and distal descending colon  exhibit a moderate amount of stool but are not quite as distended as the more proximal portions of the colon. No bowel wall thickening is demonstrated to suggest colitis. There is no evidence of an annular constricting lesion. There are now all loops of mildly distended distal small bowel in the lower abdomen and upper pelvis. The appendix is again demonstrated and contains an appendicolith, it but does not appear edematous nor is there surrounding inflammatory change. The appendix is demonstrated best on the coronal reconstructed images, but in the axial plane it is demonstrated on images 57 through 66 of series 2.  Atelectasis versus early pneumonia in both lower lobes has developed since the previous study. There are small bilateral pleural effusions as well. Again demonstrated is chronic atrophy of the right gluteal musculature. The lumbar vertebral bodies are preserved in height. The intervertebral disc space heights appear reasonably well maintained.  IMPRESSION: 1. A large amount of stool is present within the colon as described. This pattern is similar to that seen on the CT scan of May 10, 2013 and is consistent with severe constipation. No annular constricting lesion or other obstructive process is demonstrated. There are no bowel wall changes to suggest colitis or diverticulitis. Distention of distal small bowel loops has developed since the earlier study. 2. Distention of the gallbladder is more prominent today and there is mild gallbladder wall thickening  which may reflect development of acalculus cholecystitis. 3. There is new bibasilar atelectasis versus early pneumonia in the lower lobes posteriorly. Small bilateral pleural effusions have developed as well. 4. There is no evidence of urinary tract obstruction or acute inflammation. Renal cystic lesions are present bilaterally. Prostatic enlargement is again demonstrated.   Electronically Signed   By: David  [place]   On: 05/13/2013 11:43   Dg  Abd Acute W/chest  05/13/2013   CLINICAL DATA:  Patient is complaining of abdominal pain and distension and has a history of small bowel obstruction.  EXAM: ACUTE ABDOMEN SERIES (ABDOMEN 2 VIEW & CHEST 1 VIEW)  COMPARISON:  CT scan of the abdomen and pelvis dated May 10, 2013.  FINDINGS: The lungs are mildly hypoinflated. The interstitial markings are mildly increased but this is in part due to vascular crowding. There is a trace of blunting of the left lateral costophrenic angle. The cardiac silhouette is mildly enlarged and the central pulmonary vascularity is prominent. Within the abdomen there is a large amount of stool throughout the ascending and transverse and descending portions of the colon. Proximal to this there are a few loops of mildly distended gas-filled small bowel. These lie principally within the pelvis. There is no more than a small amount of gas in the rectum. No free extraluminal gas collections are demonstrated. No acute bony abnormality is demonstrated. There is chronic rotatory scoliosis of the lumbar spine.  IMPRESSION: 1. The bowel gas pattern is consistent with constipation. Minimal distention of small bowel loops with gas is demonstrated as well. 2. The chest film reveals a small left pleural effusion and mild prominence of the pulmonary interstitium and pulmonary vascularity suggesting low-grade CHF.   Electronically Signed   By: David  [place]   On: 05/13/2013 11:12  I personally reviewed the imaging tests through PACS system I reviewed available ER/hospitalization records through the EMR   EKG Interpretation    Date/Time:  Tuesday May 13 2013 09:17:47 EST Ventricular Rate:  103 PR Interval:  133 QRS Duration: 83 QT Interval:  402 QTC Calculation: 526 R Axis:   12 Text Interpretation:  Sinus tachycardia Ventricular premature complex Probable left atrial enlargement Probable anteroseptal infarct, old Baseline wander in lead(s) V2 No significant change since last  tracing Confirmed by POLLINA  MD, CHRISTOPHER ([id number]) on 05/13/2013 9:20:29 AM            MDM   1. Cholecystitis    Vision presents with fever, right upper quadrant abdominal pain and abdominal distention with history of small bowel obstruction.  2 his temperature 101.3 and his recent enterococcus infection the patient restarted on back myosin.  This could represent right-sided pyelonephritis given his urinary tract infection in the right sided abdominal symptoms it is having.  When films will be obtained to evaluate for air-fluid levels.  I think the patient would benefit from advanced imaging and therefore CT abdomen pelvis has been ordered.  .  My suspicion for pneumonia is low.  Patient be monitored closely in the emergency department.  11:58 AM CT scan concerning for acute cholecystitis.  This would fit his clinical picture.  IV Zosyn now.  General surgery consultation.  Focused right upper quadrant ultrasound at this time.    Lyanne Co, MD 05/13/13 1158  12:37 PM I spoke with general surgery who will by with patient the bedside.  Given the patient's comorbidities and questionable pneumonia on CT scan they're requesting hospitalist admission.  I discussed the case with  Dr. Janee Morn who will by with admit the patient to the hospital.  My suspicion is a little higher that this is probably acute cholecystitis.  Bedside ultrasound does demonstrate cholelithiasis  Lyanne Co, MD 05/13/13 1238

## 2013-05-13 NOTE — Consult Note (Signed)
Reason for Consult:  Abdominal pain, nausea and vomiting; sick for about 1 week. Referring Physician: Dr. Azalia Bilis   Mike Mack is an 77 y.o. male.  HPI: Pt is a 77 y/o with multiple medical issues.  He was in the ER last Friday 05/10/13 with back pain, a CT at that time was unremarkable.  He got something for pain and a Urine culture later proved to be positive for Enteroccus UTI.  He told me he had been sick for a week.  His wife says since he went home on 05/10/13, he has was eating up till yesterday.  He has not had a BM for the last 2 days, he has new increasing abdominal pain, increased abdominal bloating, and this AM he developed nausea and vomiting. Work up in the ER shows a WBC of 18,900.  LFT's are normal, lipase is normal.  CT scan has multiple findings including a very large stool burden.  Distension of the small bowel. Distension of the GB, mild GB wall thickening.  An appendicolith in the appendix, but no evidence of appendicitis.  There is also some basilar atelectasis both lower lobes, vs pneumonia.  He has spiked a fever here in the ER up to 101.  He continues to have pain in the  RUQ as his primary complaint.  We are ask to see for possible acute cholecystitis.  Follow up abdominal US shows GB distended, gallbladder stones, GB wall 4.29mm no pericholecystic fluid,no murphy's sign, CBD 3.3 mm.  Past Medical History  Diagnosis Date  Diabetes mellitus -  Insulin dependant   Hypertension   Small bowel obstruction   BPH (benign prostatic hyperplasia)   Peripheral Neuropathy   Stroke (Thrombotic left thalamus infarction) 03/2012  Polio Childhood  Arthritis on steroids    Chronic Constipation on Miralax and mineral oil 05/13/2013  Anemia 05/13/2013    Past Surgical History  Procedure Laterality Date  . Bowel resection 06/28/08   . Hernia repair Date unknown   . Cataract extraction w/ intraocular lens  implant, bilateral      Family History  Problem Relation Age of  Onset  . Diabetes Sister   . Diabetes Sister   . Diabetes Brother   . Hypertension Sister   . Hypertension Sister   . Hypertension Brother   . Heart failure Mother   . Heart attack Sister     Social History:  reports that he has never smoked. He has never used smokeless tobacco. He reports that he does not drink alcohol or use illicit drugs. Tobacco:  None Drug:  None ETOH:  None Married/retired Education administrator. Allergies: No Known Allergies  Medications:  Prior to Admission:  Prescriptions prior to admission  Medication Sig Dispense Refill  . amLODipine (NORVASC) 5 MG tablet Take 5 mg by mouth every morning.       Marland Kitchen aspirin 325 MG tablet Take 1 tablet (325 mg total) by mouth daily.      Marland Kitchen atorvastatin (LIPITOR) 20 MG tablet Take 1 tablet by mouth every evening.      . gabapentin (NEURONTIN) 800 MG tablet Take 1 tablet by mouth 3 (three) times daily as needed (HEADACHES).       . insulin detemir (LEVEMIR) 100 UNIT/ML injection Inject 50 Units into the skin 2 (two) times daily.       . irbesartan (AVAPRO) 300 MG tablet Take 300 mg by mouth at bedtime.      . metFORMIN (GLUMETZA) 1000 MG (MOD) 24 hr tablet Take  1,000 mg by mouth 2 (two) times daily with a meal.      . mineral oil liquid Take 15 mLs by mouth daily as needed for moderate constipation.      . polyethylene glycol (MIRALAX / GLYCOLAX) packet Take 17 g by mouth every morning.      . predniSONE (DELTASONE) 1 MG tablet Take 6 mg by mouth every morning. Take with 5 mg as directed. Total daily dose 6mg       . predniSONE (DELTASONE) 5 MG tablet Take 6 mg by mouth daily with breakfast. Take with 1mg  tablet as directed. Total daily dose 6mg       . solifenacin (VESICARE) 10 MG tablet Take 10 mg by mouth every evening.      . Tamsulosin HCl (FLOMAX) 0.4 MG CAPS Take 0.4 mg by mouth 2 (two) times daily.       . traMADol (ULTRAM) 50 MG tablet Take 1 tablet (50 mg total) by mouth every 6 (six) hours as needed.  15 tablet  0  . Travoprost,  BAK Free, (TRAVATAN) 0.004 % SOLN ophthalmic solution Place 1 drop into both eyes at bedtime.       Scheduled: . insulin aspart  0-9 Units Subcutaneous TID WC   Continuous: . sodium chloride 1,000 mL (05/13/13 1106)   [id number] injection Anti-infectives   Start     Dose/Rate Route Frequency Ordered Stop   05/13/13 1215  levofloxacin (LEVAQUIN) tablet 500 mg  Status:  Discontinued     500 mg Oral  Once 05/13/13 1213 05/13/13 1237   05/13/13 1200  piperacillin-tazobactam (ZOSYN) IVPB 3.375 g  Status:  Discontinued     3.375 g 12.5 mL/hr over 240 Minutes Intravenous  Once 05/13/13 1157 05/13/13 1158   05/13/13 1200  piperacillin-tazobactam (ZOSYN) IVPB 3.375 g     3.375 g 100 mL/hr over 30 Minutes Intravenous STAT 05/13/13 1159 05/13/13 1302   05/13/13 1000  vancomycin (VANCOCIN) IVPB 1000 mg/200 mL premix     1,000 mg 200 mL/hr over 60 Minutes Intravenous  Once 05/13/13 0937 05/13/13 1214      Results for orders placed during the hospital encounter of 05/13/13 (from the past 48 hour(s))  GLUCOSE, CAPILLARY     Status: Abnormal   Collection Time    05/13/13  9:21 AM      Result Value Range   Glucose-Capillary 55 (*) 70 - 99 mg/dL   Comment 1 Notify RN    CBC WITH DIFFERENTIAL     Status: Abnormal   Collection Time    05/13/13  9:35 AM      Result Value Range   WBC 18.9 (*) 4.0 - 10.5 K/uL   RBC 3.57 (*) 4.22 - 5.81 MIL/uL   Hemoglobin 10.6 (*) 13.0 - 17.0 g/dL   HCT 98.1 (*) 19.1 - 47.8 %   MCV 89.4  78.0 - 100.0 fL   MCH 29.7  26.0 - 34.0 pg   MCHC 33.2  30.0 - 36.0 g/dL   RDW 29.5  62.1 - 30.8 %   Platelets 236  150 - 400 K/uL   Neutrophils Relative % 89 (*) 43 - 77 %   Neutro Abs 16.8 (*) 1.7 - 7.7 K/uL   Lymphocytes Relative 4 (*) 12 - 46 %   Lymphs Abs 0.8  0.7 - 4.0 K/uL   Monocytes Relative 6  3 - 12 %   Monocytes Absolute 1.2 (*) 0.1 - 1.0 K/uL   Eosinophils Relative 0  0 -  5 %   Eosinophils Absolute 0.0  0.0 - 0.7 K/uL   Basophils Relative 0  0 - 1 %    Basophils Absolute 0.0  0.0 - 0.1 K/uL  COMPREHENSIVE METABOLIC PANEL     Status: Abnormal   Collection Time    05/13/13  9:35 AM      Result Value Range   Sodium 132 (*) 135 - 145 mEq/L   Potassium 4.2  3.5 - 5.1 mEq/L   Chloride 95 (*) 96 - 112 mEq/L   CO2 26  19 - 32 mEq/L   Glucose, Bld 62 (*) 70 - 99 mg/dL   BUN 22  6 - 23 mg/dL   Creatinine, Ser 0.98 (*) 0.50 - 1.35 mg/dL   Calcium 9.5  8.4 - 11.9 mg/dL   Total Protein 7.5  6.0 - 8.3 g/dL   Albumin 3.3 (*) 3.5 - 5.2 g/dL   AST 26  0 - 37 U/L   ALT 16  0 - 53 U/L   Alkaline Phosphatase 87  39 - 117 U/L   Total Bilirubin 0.9  0.3 - 1.2 mg/dL   GFR calc non Af Amer 46 (*) >90 mL/min   GFR calc Af Amer 53 (*) >90 mL/min   Comment: (NOTE)     The eGFR has been calculated using the CKD EPI equation.     This calculation has not been validated in all clinical situations.     eGFR's persistently <90 mL/min signify possible Chronic Kidney     Disease.  LIPASE, BLOOD     Status: Abnormal   Collection Time    05/13/13  9:35 AM      Result Value Range   Lipase 9 (*) 11 - 59 U/L  URINALYSIS, ROUTINE W REFLEX MICROSCOPIC     Status: Abnormal   Collection Time    05/13/13  9:48 AM      Result Value Range   Color, Urine AMBER (*) YELLOW   Comment: BIOCHEMICALS MAY BE AFFECTED BY COLOR   APPearance CLOUDY (*) CLEAR   Specific Gravity, Urine 1.024  1.005 - 1.030   pH 7.5  5.0 - 8.0   Glucose, UA 250 (*) NEGATIVE mg/dL   Hgb urine dipstick NEGATIVE  NEGATIVE   Bilirubin Urine NEGATIVE  NEGATIVE   Ketones, ur NEGATIVE  NEGATIVE mg/dL   Protein, ur 147 (*) NEGATIVE mg/dL   Urobilinogen, UA 1.0  0.0 - 1.0 mg/dL   Nitrite NEGATIVE  NEGATIVE   Leukocytes, UA TRACE (*) NEGATIVE  URINE MICROSCOPIC-ADD ON     Status: Abnormal   Collection Time    05/13/13  9:48 AM      Result Value Range   WBC, UA 0-2  <3 WBC/hpf   Bacteria, UA FEW (*) RARE    Ct Abdomen Pelvis W Contrast  05/13/2013   CLINICAL DATA:  Nausea and vomiting and  abdominal distention and pain with fever. There is history of small bowel obstruction.  EXAM: CT ABDOMEN AND PELVIS WITH CONTRAST  TECHNIQUE: Multidetector CT imaging of the abdomen and pelvis was performed using the standard protocol following bolus administration of intravenous contrast.  CONTRAST:  OMNIPAQUE IOHEXOL 300 MG/ML SOLN intravenously. The patient also received oral contrast material.  COMPARISON:  Acute abdominal series dated May 13, 2013 and CT scan of the abdomen and pelvis dated 10 May 2013  FINDINGS: The gallbladder is quite distended today. Mild gallbladder wall thickening is visible though there is no pericholecystic fluid. No  stones are evident. The liver exhibits no focal mass nor ductal dilation. The pancreas, spleen, and partially distended stomach appear normal. The adrenal glands are normal in density and contour. The kidneys exhibit no evidence of obstruction. There is a nonobstructing 2 mm diameter lower pole stone on the right. There are numerous cortical hypodensities compatible with cysts. The perinephric fat is normal in appearance. The urinary bladder is partially distended and grossly normal. The enlarged prostate gland produces a prominent impression upon the urinary bladder base. There is no inguinal nor umbilical hernia. The psoas musculature is normal in appearance. The caliber of the abdominal aorta is normal. There is no periaortic or pericaval lymphadenopathy.  There is a large amount of stool in the ascending and transverse and proximal descending portions of the colon. This is similar in appearance to that demonstrated on the earlier study of 22 November. The mid and distal descending colon exhibit a moderate amount of stool but are not quite as distended as the more proximal portions of the colon. No bowel wall thickening is demonstrated to suggest colitis. There is no evidence of an annular constricting lesion. There are now all loops of mildly distended  distal small bowel in the lower abdomen and upper pelvis. The appendix is again demonstrated and contains an appendicolith, it but does not appear edematous nor is there surrounding inflammatory change. The appendix is demonstrated best on the coronal reconstructed images, but in the axial plane it is demonstrated on images 57 through 66 of series 2.  Atelectasis versus early pneumonia in both lower lobes has developed since the previous study. There are small bilateral pleural effusions as well. Again demonstrated is chronic atrophy of the right gluteal musculature. The lumbar vertebral bodies are preserved in height. The intervertebral disc space heights appear reasonably well maintained.  IMPRESSION: 1. A large amount of stool is present within the colon as described. This pattern is similar to that seen on the CT scan of May 10, 2013 and is consistent with severe constipation. No annular constricting lesion or other obstructive process is demonstrated. There are no bowel wall changes to suggest colitis or diverticulitis. Distention of distal small bowel loops has developed since the earlier study. 2. Distention of the gallbladder is more prominent today and there is mild gallbladder wall thickening which may reflect development of acalculus cholecystitis. 3. There is new bibasilar atelectasis versus early pneumonia in the lower lobes posteriorly. Small bilateral pleural effusions have developed as well. 4. There is no evidence of urinary tract obstruction or acute inflammation. Renal cystic lesions are present bilaterally. Prostatic enlargement is again demonstrated.   Electronically Signed   By: David  [place]   On: 05/13/2013 11:43   US Abdomen Limited  05/13/2013   CLINICAL DATA:  Right upper quadrant abdominal discomfort and fever, distended gallbladder on recent CT scan  EXAM: LIMITED ABDOMEN ULTRASOUND FOR ASCITES  COMPARISON:  CT scan of the abdomen and pelvis of today's date.  FINDINGS:  Gallbladder  The gallbladder is moderately distended. There are echogenic stones as well as echogenic bile present. The stones appear mobile. The gallbladder wall is mildly thickened at 4.7 mm. There is no pericholecystic fluid nor positive sonographic Murphy's sign.  Common bile duct  Diameter: 3.3 mm.  Liver  No focal lesion identified. Within normal limits in parenchymal echogenicity.  IVC  No abnormality visualized.  Pancreas  Bowel gas obscures the pancreas.  Spleen  Size and appearance within normal limits.  Right Kidney  Length:  9 cm. Echogenicity within normal limits. Multiple cysts are present with the largest lying in the midpole laterally measuring 1.8 x 2 x 1.7 cm.  Left Kidney  Length: 9.8 cm. Echogenicity within normal limits. A lower pole cyst on the left measures 2.3 x 2.1 x 1.9 cm.  Abdominal aorta  The abdominal aorta is obscured by bowel gas.  IMPRESSION: 1. The gallbladder is distended and exhibits wall thickening with multiple tiny stones as well as echogenic bile. There is no positive sonographic Murphy's sign or definite pericholecystic fluid. 2. No acute abnormality of the liver or common bile duct is demonstrated. The pancreas is obscured by bowel gas. 3. There are simple appearing cysts in both kidneys. There is no hydronephrosis.   Electronically Signed   By: David  [place]   On: 05/13/2013 12:54   Dg Abd Acute W/chest  05/13/2013   CLINICAL DATA:  Patient is complaining of abdominal pain and distension and has a history of small bowel obstruction.  EXAM: ACUTE ABDOMEN SERIES (ABDOMEN 2 VIEW & CHEST 1 VIEW)  COMPARISON:  CT scan of the abdomen and pelvis dated May 10, 2013.  FINDINGS: The lungs are mildly hypoinflated. The interstitial markings are mildly increased but this is in part due to vascular crowding. There is a trace of blunting of the left lateral costophrenic angle. The cardiac silhouette is mildly enlarged and the central pulmonary vascularity is prominent. Within the  abdomen there is a large amount of stool throughout the ascending and transverse and descending portions of the colon. Proximal to this there are a few loops of mildly distended gas-filled small bowel. These lie principally within the pelvis. There is no more than a small amount of gas in the rectum. No free extraluminal gas collections are demonstrated. No acute bony abnormality is demonstrated. There is chronic rotatory scoliosis of the lumbar spine.  IMPRESSION: 1. The bowel gas pattern is consistent with constipation. Minimal distention of small bowel loops with gas is demonstrated as well. 2. The chest film reveals a small left pleural effusion and mild prominence of the pulmonary interstitium and pulmonary vascularity suggesting low-grade CHF.   Electronically Signed   By: David  [place]   On: 05/13/2013 11:12    Review of Systems  Constitutional: Positive for fever. Negative for weight loss.  HENT: Negative.   Eyes: Negative.  Photophobia: in the ER, wife was not aware of fever at home.       Wears glasses  Respiratory: Positive for shortness of breath. Wheezing: Pt wife reports he has had DOE walking for about 6 months.   Cardiovascular: Positive for leg swelling. PND: bilat lower feet swelling for some time.  Gastrointestinal: Positive for nausea, vomiting and constipation. Negative for diarrhea. Abdominal pain: Nausea and vomiting this AM. Blood in stool: Chronic on mineral oil and Miralax.  Genitourinary: Negative.   Musculoskeletal: Positive for back pain and joint pain (he is on steroids for arthritis per wife.).  Skin: Negative.   Neurological: Positive for focal weakness. Seizures: RLE.       He has had some RLE weakness since his stroke in 03/2012.  He uses a walker and cane to ambulate. Prior to stroke he was able to work and get around without any difficulty.    Psychiatric/Behavioral: Negative.    Blood pressure 136/55, pulse 95, temperature 100.3 F (37.9 C), temperature source  Oral, resp. rate 15, SpO2 95.00%. Physical Exam  Constitutional: He is oriented to person, place, and time. He appears  well-developed. No distress.  HENT:  Head: Normocephalic.  Nose: Nose normal.  Mouth/Throat: No oropharyngeal exudate.  Eyes: Conjunctivae and EOM are normal. Pupils are equal, round, and reactive to light. Right eye exhibits no discharge. Left eye exhibits no discharge. No scleral icterus.  Neck: Normal range of motion. Neck supple. No JVD present. No tracheal deviation present. No thyromegaly present.  Cardiovascular: Normal rate, regular rhythm, normal heart sounds and intact distal pulses.  Exam reveals no gallop.   No murmur heard. Respiratory: Effort normal and breath sounds normal. No respiratory distress. He has no wheezes. He has no rales. He exhibits no tenderness.  GI: He exhibits distension (He is very distended and tight, ). He exhibits no mass. There is tenderness. There is guarding. There is no rebound (pain is in the RUQ).  Few BS some high pitched. Midline surgical incision.  Musculoskeletal: He exhibits no edema and no tenderness.  Lymphadenopathy:    He has no cervical adenopathy.  Neurological: He is alert and oriented to person, place, and time.  He is letting his wife do all the talking, but he follows and answers if you question him directly.  Skin: Skin is warm and dry. No rash noted. He is not diaphoretic. No erythema. No pallor.  Psychiatric: He has a normal mood and affect. His behavior is normal. Judgment and thought content normal.    Assessment/Plan: 1. Abdominal pain with acute cholecystitis 2. UTI 3.  Severe constipation with marked abdominal distension 4.   AODM ID 5.   Recent stroke with ongoing right sided weakness.  He uses cane and walker for ambulation since his stroke. 6.  Polio as a child, he did well till his stroke last year. 7.  Hx of Right inguinal hernia repair  8.   SBO with lysis of adhesions 06/28/08. 9.   Hypertension 10. BPH  Plan:  He has been examined and evaluated by Dr. Derrell Lolling.  It is his opinion that his symptoms are consistent with acute cholecystitis. His abdominal distension and severe constipation make it very difficult to go forward with a laparoscopic cholecystectomy.  It is his opinion pt should have a IR cholecystostomy, and then treat his constipation.  With plans to go back for drain removal in 6-8 weeks, followed by elective cholecystectomy.  He agrees with Zosyn for antibiotic coverage.  He will need an NG if he has further nausea.  We will start with enemas following his percutaneous IR drain.    Priscilla Kirstein 05/13/2013, 1:29 PM

## 2013-05-13 NOTE — ED Notes (Signed)
Surgery PA at bedside.  

## 2013-05-13 NOTE — ED Notes (Signed)
US at bedside

## 2013-05-13 NOTE — Progress Notes (Signed)
Drainage from biliary drain appears to be dark red blood. This RN held pt's 2200 lovenox dose. Night floor coverage, Dr. Gonzella Lex notified who advised to contact interventional radiology or [facility]. Dr. Bonnielee Haff with IR paged, no response after 15 minutes, paged again with no response. First page at [phone]. [facility] paged, Dr. Biagio Quint recommended continuing to contact on call Interventional Radiologist, also recommended holding pt's lovenox. Upon further evaluation of fluid in biliary drain, 100 ml of blood appeared to have clotted and this RN was unable to drain. Interventional Radiology department was contacted and Dr. Bonnielee Haff was reached at [phone], who stated that he had not received this RN's pages. Dr. Bonnielee Haff recommended that since blood in drain had clotted, and that this RN should wait three hours, and then attempt to flush biliary drain. Dr. Bonnielee Haff stated that he would be available for further recommendations if this RN was unable to flush, or further bleeding noted. This RN to continue to monitor and will attempt flush at 0140 on 05/14/13. Eugene Garnet RN

## 2013-05-14 DIAGNOSIS — K5900 Constipation, unspecified: Secondary | ICD-10-CM

## 2013-05-14 LAB — COMPREHENSIVE METABOLIC PANEL
ALT: 32 U/L (ref 0–53)
AST: 48 U/L — ABNORMAL HIGH (ref 0–37)
Albumin: 2.7 g/dL — ABNORMAL LOW (ref 3.5–5.2)
CO2: 22 mEq/L (ref 19–32)
Calcium: 8.5 mg/dL (ref 8.4–10.5)
Chloride: 98 mEq/L (ref 96–112)
Creatinine, Ser: 1.61 mg/dL — ABNORMAL HIGH (ref 0.50–1.35)
GFR calc Af Amer: 47 mL/min — ABNORMAL LOW (ref >90)
GFR calc non Af Amer: 40 mL/min — ABNORMAL LOW (ref >90)
Glucose, Bld: 155 mg/dL — ABNORMAL HIGH (ref 70–99)
Potassium: 4.6 mEq/L (ref 3.5–5.1)
Total Bilirubin: 1.2 mg/dL (ref 0.3–1.2)

## 2013-05-14 LAB — CBC
MCH: 29.7 pg (ref 26.0–34.0)
MCHC: 33.4 g/dL (ref 30.0–36.0)
MCV: 88.7 fL (ref 78.0–100.0)
Platelets: 226 10*3/uL (ref 150–400)
RBC: 3.27 MIL/uL — ABNORMAL LOW (ref 4.22–5.81)
RDW: 13.4 % (ref 11.5–15.5)

## 2013-05-14 LAB — VITAMIN B12: Vitamin B-12: 576 pg/mL (ref 211–911)

## 2013-05-14 LAB — GLUCOSE, CAPILLARY
Glucose-Capillary: 435 mg/dL — ABNORMAL HIGH (ref 70–99)
Glucose-Capillary: 298 mg/dL — ABNORMAL HIGH (ref 70–99)
Glucose-Capillary: 286 mg/dL — ABNORMAL HIGH (ref 70–99)

## 2013-05-14 MED ORDER — INSULIN ASPART 100 UNIT/ML ~~LOC~~ SOLN: 0.0000 [IU] | Freq: Every day | SUBCUTANEOUS | Status: DC

## 2013-05-14 MED ORDER — DOCUSATE SODIUM 100 MG PO CAPS
100.0000 mg | ORAL_CAPSULE | Freq: Two times a day (BID) | ORAL | Status: DC
  Administered 2013-05-14 – 2013-05-19 (×11): 100 mg via ORAL
  Filled 2013-05-14 (×12): qty 1

## 2013-05-14 MED ORDER — INSULIN ASPART 100 UNIT/ML ~~LOC~~ SOLN
0.0000 [IU] | Freq: Three times a day (TID) | SUBCUTANEOUS | Status: DC
  Administered 2013-05-14 (×2): 11 [IU] via SUBCUTANEOUS
  Administered 2013-05-15 (×2): 4 [IU] via SUBCUTANEOUS
  Administered 2013-05-16: 3 [IU] via SUBCUTANEOUS
  Administered 2013-05-16 (×2): 4 [IU] via SUBCUTANEOUS
  Administered 2013-05-17: 3 [IU] via SUBCUTANEOUS
  Administered 2013-05-18 (×2): 4 [IU] via SUBCUTANEOUS
  Administered 2013-05-19: 3 [IU] via SUBCUTANEOUS

## 2013-05-14 MED ORDER — POLYETHYLENE GLYCOL 3350 17 G PO PACK
34.0000 g | PACK | Freq: Three times a day (TID) | ORAL | Status: AC
  Administered 2013-05-14 (×3): 34 g via ORAL
  Filled 2013-05-14 (×3): qty 2

## 2013-05-14 MED ORDER — MILK AND MOLASSES ENEMA
Freq: Two times a day (BID) | RECTAL | Status: AC
  Administered 2013-05-14 (×2): via RECTAL
  Filled 2013-05-14 (×2): qty 250

## 2013-05-14 MED ORDER — POLYETHYLENE GLYCOL 3350 17 G PO PACK: 17.0000 g | PACK | Freq: Every day | ORAL | Status: DC

## 2013-05-14 MED ORDER — INSULIN ASPART 100 UNIT/ML ~~LOC~~ SOLN: 0.0000 [IU] | Freq: Three times a day (TID) | SUBCUTANEOUS | Status: DC

## 2013-05-14 NOTE — Progress Notes (Addendum)
Subjective: Alert and stable. Not a good historian but he states that his right upper quadrant pain is better. No nausea or vomiting.  Percutaneous cholecystostomy successful. Dark black fluid evacuated. Fluid now serosanguineous. Gram stain shows no organisms. Culture pending.  Temps trending down, 99.7. Heart rate 103. There BP 155/71. Oxygen saturation 94%  WBC still 18,000. Bilirubin 1.2. AST 48. Creatinine 1.61. BUN 30. Glucose 155. Objective: Vital signs in last 24 hours: Temp:  [98.1 F (36.7 C)-101.3 F (38.5 C)] 99.7 F (37.6 C) (11/26 0458) Pulse Rate:  [95-113] 103 (11/26 0458) Resp:  [14-24] 15 (11/26 0458) BP: (136-185)/(55-104) 155/71 mmHg (11/26 0458) SpO2:  [86 %-100 %] 94 % (11/26 0458) Weight:  [152 lb 5.4 oz (69.1 kg)] 152 lb 5.4 oz (69.1 kg) (11/25 1420) Last BM Date: 05/10/13  Intake/Output from previous day: 11/25 0701 - 11/26 0700 In: 1080 [I.V.:1080] Out: 351 [Urine:1; Drains:350] Intake/Output this shift:    General appearance: alert. In no distress. Poor historian. Oriented to place and situation. GI: abdomen distended but soft. Less tender right upper quadrant. No mass. Hypoactive bowel sounds.  Lab Results:   Recent Labs  05/13/13 0935 05/14/13 0340  WBC 18.9* 18.0*  HGB 10.6* 9.7*  HCT 31.9* 29.0*  PLT 236 226   BMET  Recent Labs  05/13/13 0935 05/14/13 0340  NA 132* 132*  K 4.2 4.6  CL 95* 98  CO2 26 22  GLUCOSE 62* 155*  BUN 22 30*  CREATININE 1.45* 1.61*  CALCIUM 9.5 8.5   PT/INR  Recent Labs  05/13/13 1431  LABPROT 16.5*  INR 1.37   ABG No results found for this basename: PHART, PCO2, PO2, HCO3,  in the last 72 hours  Studies/Results: Ct Abdomen Pelvis W Contrast  05/13/2013   CLINICAL DATA:  Nausea and vomiting and abdominal distention and pain with fever. There is history of small bowel obstruction.  EXAM: CT ABDOMEN AND PELVIS WITH CONTRAST  TECHNIQUE: Multidetector CT imaging of the abdomen and pelvis was  performed using the standard protocol following bolus administration of intravenous contrast.  CONTRAST:  OMNIPAQUE IOHEXOL 300 MG/ML SOLN intravenously. The patient also received oral contrast material.  COMPARISON:  Acute abdominal series dated May 13, 2013 and CT scan of the abdomen and pelvis dated 10 May 2013  FINDINGS: The gallbladder is quite distended today. Mild gallbladder wall thickening is visible though there is no pericholecystic fluid. No stones are evident. The liver exhibits no focal mass nor ductal dilation. The pancreas, spleen, and partially distended stomach appear normal. The adrenal glands are normal in density and contour. The kidneys exhibit no evidence of obstruction. There is a nonobstructing 2 mm diameter lower pole stone on the right. There are numerous cortical hypodensities compatible with cysts. The perinephric fat is normal in appearance. The urinary bladder is partially distended and grossly normal. The enlarged prostate gland produces a prominent impression upon the urinary bladder base. There is no inguinal nor umbilical hernia. The psoas musculature is normal in appearance. The caliber of the abdominal aorta is normal. There is no periaortic or pericaval lymphadenopathy.  There is a large amount of stool in the ascending and transverse and proximal descending portions of the colon. This is similar in appearance to that demonstrated on the earlier study of 22 November. The mid and distal descending colon exhibit a moderate amount of stool but are not quite as distended as the more proximal portions of the colon. No bowel wall thickening is demonstrated  to suggest colitis. There is no evidence of an annular constricting lesion. There are now all loops of mildly distended distal small bowel in the lower abdomen and upper pelvis. The appendix is again demonstrated and contains an appendicolith, it but does not appear edematous nor is there surrounding inflammatory  change. The appendix is demonstrated best on the coronal reconstructed images, but in the axial plane it is demonstrated on images 57 through 66 of series 2.  Atelectasis versus early pneumonia in both lower lobes has developed since the previous study. There are small bilateral pleural effusions as well. Again demonstrated is chronic atrophy of the right gluteal musculature. The lumbar vertebral bodies are preserved in height. The intervertebral disc space heights appear reasonably well maintained.  IMPRESSION: 1. A large amount of stool is present within the colon as described. This pattern is similar to that seen on the CT scan of May 10, 2013 and is consistent with severe constipation. No annular constricting lesion or other obstructive process is demonstrated. There are no bowel wall changes to suggest colitis or diverticulitis. Distention of distal small bowel loops has developed since the earlier study. 2. Distention of the gallbladder is more prominent today and there is mild gallbladder wall thickening which may reflect development of acalculus cholecystitis. 3. There is new bibasilar atelectasis versus early pneumonia in the lower lobes posteriorly. Small bilateral pleural effusions have developed as well. 4. There is no evidence of urinary tract obstruction or acute inflammation. Renal cystic lesions are present bilaterally. Prostatic enlargement is again demonstrated.   Electronically Signed   By: David  [place]   On: 05/13/2013 11:43   US Abdomen Limited  05/13/2013   CLINICAL DATA:  Right upper quadrant abdominal discomfort and fever, distended gallbladder on recent CT scan  EXAM: LIMITED ABDOMEN ULTRASOUND FOR ASCITES  COMPARISON:  CT scan of the abdomen and pelvis of today's date.  FINDINGS: Gallbladder  The gallbladder is moderately distended. There are echogenic stones as well as echogenic bile present. The stones appear mobile. The gallbladder wall is mildly thickened at 4.7 mm. There is  no pericholecystic fluid nor positive sonographic Murphy's sign.  Common bile duct  Diameter: 3.3 mm.  Liver  No focal lesion identified. Within normal limits in parenchymal echogenicity.  IVC  No abnormality visualized.  Pancreas  Bowel gas obscures the pancreas.  Spleen  Size and appearance within normal limits.  Right Kidney  Length: 9 cm. Echogenicity within normal limits. Multiple cysts are present with the largest lying in the midpole laterally measuring 1.8 x 2 x 1.7 cm.  Left Kidney  Length: 9.8 cm. Echogenicity within normal limits. A lower pole cyst on the left measures 2.3 x 2.1 x 1.9 cm.  Abdominal aorta  The abdominal aorta is obscured by bowel gas.  IMPRESSION: 1. The gallbladder is distended and exhibits wall thickening with multiple tiny stones as well as echogenic bile. There is no positive sonographic Murphy's sign or definite pericholecystic fluid. 2. No acute abnormality of the liver or common bile duct is demonstrated. The pancreas is obscured by bowel gas. 3. There are simple appearing cysts in both kidneys. There is no hydronephrosis.   Electronically Signed   By: David  [place]   On: 05/13/2013 12:54   Ir Perc Cholecystostomy  05/13/2013   CLINICAL DATA:  77 year old with acute cholecystitis. The patient is not a good surgical candidate right now due to abdominal distention from fecal impaction.  EXAM: PERCUTANEOUS CHOLECYSTOSTOMY TUBE PLACEMENT WITH ULTRASOUND AND  FLUOROSCOPIC GUIDANCE  Physician: Rachelle Hora. Lowella Dandy, MD  MEDICATIONS: Fentanyl 25 mcg IV. A radiology nurse monitored the patient throughout the procedure.  FLUOROSCOPY TIME:  12 seconds  PROCEDURE: Informed consent was obtained for percutaneous cholecystostomy tube placement. Patient was placed supine on the interventional table. Ultrasound demonstrated a dilated gallbladder. The right side of the abdomen was prepped and draped in sterile fashion. Maximal barrier sterile technique was utilized including caps, mask, sterile gowns,  sterile gloves, sterile drape, hand hygiene and skin antiseptic. The right side of the abdomen was anesthetized with 1% lidocaine. Using ultrasound guidance, a 22 gauge needle was directed into the distended gallbladder from a transhepatic approach. A 0.018 wire was advanced into the gallbladder. The tract was dilated to accommodate a 10 [place] multipurpose drain. Dark black fluid was aspirated from the gallbladder. Catheter was sutured to the skin and attached to a gravity bag. A sample of the bile was sent for Gram stain and culture. Fluoroscopic and ultrasound images were taken and saved for documentation.  COMPLICATIONS: None  FINDINGS: Distended gallbladder.  Successful placement of gallbladder drain.  IMPRESSION: Successful cholecystostomy tube placement with ultrasound and fluoroscopic guidance. Sample of the fluid was sent for Gram stain and culture.   Electronically Signed   By: Richarda Overlie Mack.D.   On: 05/13/2013 18:05   Dg Abd Acute W/chest  05/13/2013   CLINICAL DATA:  Patient is complaining of abdominal pain and distension and has a history of small bowel obstruction.  EXAM: ACUTE ABDOMEN SERIES (ABDOMEN 2 VIEW & CHEST 1 VIEW)  COMPARISON:  CT scan of the abdomen and pelvis dated May 10, 2013.  FINDINGS: The lungs are mildly hypoinflated. The interstitial markings are mildly increased but this is in part due to vascular crowding. There is a trace of blunting of the left lateral costophrenic angle. The cardiac silhouette is mildly enlarged and the central pulmonary vascularity is prominent. Within the abdomen there is a large amount of stool throughout the ascending and transverse and descending portions of the colon. Proximal to this there are a few loops of mildly distended gas-filled small bowel. These lie principally within the pelvis. There is no more than a small amount of gas in the rectum. No free extraluminal gas collections are demonstrated. No acute bony abnormality is demonstrated.  There is chronic rotatory scoliosis of the lumbar spine.  IMPRESSION: 1. The bowel gas pattern is consistent with constipation. Minimal distention of small bowel loops with gas is demonstrated as well. 2. The chest film reveals a small left pleural effusion and mild prominence of the pulmonary interstitium and pulmonary vascularity suggesting low-grade CHF.   Electronically Signed   By: David  [place]   On: 05/13/2013 11:12    Anti-infectives: Anti-infectives   Start     Dose/Rate Route Frequency Ordered Stop   05/13/13 2000  piperacillin-tazobactam (ZOSYN) IVPB 3.375 g     3.375 g 12.5 mL/hr over 240 Minutes Intravenous Every 8 hours 05/13/13 1603     05/13/13 1215  levofloxacin (LEVAQUIN) tablet 500 mg  Status:  Discontinued     500 mg Oral  Once 05/13/13 1213 05/13/13 1237   05/13/13 1200  piperacillin-tazobactam (ZOSYN) IVPB 3.375 g  Status:  Discontinued     3.375 g 12.5 mL/hr over 240 Minutes Intravenous  Once 05/13/13 1157 05/13/13 1158   05/13/13 1200  piperacillin-tazobactam (ZOSYN) IVPB 3.375 g     3.375 g 100 mL/hr over 30 Minutes Intravenous STAT 05/13/13 1159 05/13/13 1302  05/13/13 1000  vancomycin (VANCOCIN) IVPB 1000 mg/200 mL premix     1,000 mg 200 mL/hr over 60 Minutes Intravenous  Once 05/13/13 1610 05/13/13 1214      Assessment/Plan:  Acute cholecystitis with cholelithiasis. Stable following percutaneous cholecystostomy Continue antibiotics Check cultures Clear liquid diet  Pan colonic fecal impaction and constipation.We need to aggressively reverse this. Enemas twice today and MiraLAX 3 times today.  VTE prophylaxis. lovenox.   Diabetes mellitus, insulin-dependent   Hypertension   History exploratoty laparotomy and lysis of adhesions for small bowel obstruction. (06/28/2008 - Dr. Gerrit Friends)   History right inguinal hernia repair   Peripheral neuropathy   Cerebrovascular accident October 2013   Polio as a child   Arthritis on chronic steroids  Recent  enterococcal UTI)     LOS: 1 day    Mike Mack 05/14/2013

## 2013-05-14 NOTE — Progress Notes (Signed)
Pt with percutaneous drain with dark red drainage close to insertion site, to light red drainage at discharge to bag. Drainage appears draining per gravity; however, drain was flushed on previous shift to encourage flow to recommence. Drain appears satisfactory, concerning that flow may become blocked if drain not flushed periodically; however, at this time, appears to require monitoring as per orders to document output q shift. No further action likely required.

## 2013-05-14 NOTE — Care Management Note (Signed)
   CARE MANAGEMENT NOTE 05/14/2013  Patient:  Mike Mack, Mike Mack   Account Number:  [id number]  Date Initiated:  05/14/2013  Documentation initiated by:  Roda Lauture  Subjective/Objective Assessment:   77 yo male admitted with acute cholecystitis.     Action/Plan:   SNF   Anticipated DC Date:     Anticipated DC Plan:  SKILLED NURSING FACILITY  In-house referral  Clinical Social Worker      DC Associate Professor  CM consult      Choice offered to / List presented to:  NA   DME arranged  NA      DME agency  NA     HH arranged  NA      HH agency  NA   Status of service:  Completed, signed off Medicare Important Message given?   (If response is "NO", the following Medicare IM given date fields will be blank) Date Medicare IM given:   Date Additional Medicare IM given:    Discharge Disposition:    Per UR Regulation:  Reviewed for med. necessity/level of care/duration of stay  If discussed at Long Length of Stay Meetings, dates discussed:    Comments:  05/14/13 1251 Mike Mack [phone] chart reviewed for utilization of services. Pt recommendation for SNF. CSW to follow.

## 2013-05-14 NOTE — Progress Notes (Addendum)
Clinical Social Work Department CLINICAL SOCIAL WORK PLACEMENT NOTE 05/14/2013  Patient:  ADOLF, ORMISTON  Account Number:  [id number] Admit date:  05/13/2013  Clinical Social Worker:  Jacelyn Grip  Date/time:  05/14/2013 04:48 PM  Clinical Social Work is seeking post-discharge placement for this patient at the following level of care:   SKILLED NURSING   (*CSW will update this form in Epic as items are completed)   05/14/2013  Patient/family provided with [facility] Department of Clinical Social Work's list of facilities offering this level of care within the geographic area requested by the patient (or if unable, by the patient's family).  05/14/2013  Patient/family informed of their freedom to choose among providers that offer the needed level of care, that participate in Medicare, Medicaid or managed care program needed by the patient, have an available bed and are willing to accept the patient.  05/14/2013  Patient/family informed of [facility]' ownership interest in [facility], as well as of the fact that they are under no obligation to receive care at this facility.  PASARR submitted to EDS on 05/14/2013 PASARR number received from EDS on 05/14/2013  FL2 transmitted to all facilities in geographic area requested by pt/family on  05/14/2013 FL2 transmitted to all facilities within larger geographic area on   Patient informed that his/her managed care company has contracts with or will negotiate with  certain facilities, including the following:     Patient/family informed of bed offers received:  05/16/2013  Patient chooses bed at [facility] Physician recommends and patient chooses bed at    Patient to be transferred to  on  [facility] on 05/19/2013 Patient to be transferred to facility by ambulance Sharin Mons)  The following physician request were entered in Epic:   Additional Comments:   Jacklynn Lewis, MSW, LCSWA   Clinical Social Work [phone]

## 2013-05-14 NOTE — Evaluation (Signed)
Physical Therapy Evaluation Patient Details Name: Mike Mack MRN: [id number] DOB: January 28, 1936 Today's Date: 05/14/2013 Time: 1202-1225 PT Time Calculation (min): 23 min  PT Assessment / Plan / Recommendation History of Present Illness  With history of hypertension, diabetes mellitus, prior history of small bowel obstruction, BPH, history of stroke presented to the ED with 3 to four-day history of right upper quadrant pain, abdominal distention, chronic constipation, generalized weakness, nausea vomiting. Patient was seen in the ED and noted to be febrile on a 1.3. Patient also with complaints of right flank pain. Pt is now s/p percutaneous cholecystostomy/ Pt has h/o polio affecting R LE.  Clinical Impression  Pt tolerated ambulation x 100 '. Pt reports he will be home alone and may benefit from SNF for rehab. Will se how pt progresses. Pt will benefit from PT to address problems listed.    PT Assessment  Patient needs continued PT services    Follow Up Recommendations  SNF    Does the patient have the potential to tolerate intense rehabilitation      Barriers to Discharge Decreased caregiver support      Equipment Recommendations  None recommended by PT    Recommendations for Other Services     Frequency Min 3X/week    Precautions / Restrictions Precautions Precautions: Fall Required Braces or Orthoses: Other Brace/Splint Other Brace/Splint: pt states he has a R foot/leg brace but not at the hospital currently.   Pertinent Vitals/Pain No c/o      Mobility  Bed Mobility Bed Mobility: Sit to Supine Rolling Left: 4: Min assist Left Sidelying to Sit: 3: Mod assist;HOB elevated Sit to Supine: 6: Modified independent (Device/Increase time) Details for Bed Mobility Assistance: picks up RLE and places onto bed. Transfers Sit to Stand: 4: Min assist;From chair/3-in-1;With upper extremity assist;From bed Stand to Sit: To bed;To chair/3-in-1 Details for Transfer  Assistance: verbal cues for hand placement. assist to rise and steady and then control descent. Ambulation/Gait Ambulation/Gait Assistance: 4: Min assist Ambulation Distance (Feet): 100 Feet Assistive device: Rolling walker Ambulation/Gait Assistance Details: pt slides RLeg as it swings forward. Gait Pattern: Step-through pattern;Lateral trunk lean to left Gait velocity: decreased    Exercises     PT Diagnosis: Difficulty walking;Generalized weakness  PT Problem List: Decreased activity tolerance;Decreased balance;Decreased mobility PT Treatment Interventions: DME instruction;Gait training;Functional mobility training;Therapeutic activities;Therapeutic exercise;Patient/family education     PT Goals(Current goals can be found in the care plan section) Acute Rehab PT Goals Patient Stated Goal: get stronger PT Goal Formulation: With patient Time For Goal Achievement: 05/28/13 Potential to Achieve Goals: Good  Visit Information  Last PT Received On: 05/14/13 Assistance Needed: +1 History of Present Illness: With history of hypertension, diabetes mellitus, prior history of small bowel obstruction, BPH, history of stroke presented to the ED with 3 to four-day history of right upper quadrant pain, abdominal distention, chronic constipation, generalized weakness, nausea vomiting. Patient was seen in the ED and noted to be febrile on a 1.3. Patient also with complaints of right flank pain. Pt is now s/p percutaneous cholecystostomy/ Pt has h/o polio affecting R LE.       Prior Functioning  Home Living Family/patient expects to be discharged to:: Skilled nursing facility Living Arrangements: Spouse/significant other Available Help at Discharge: Family;Other (Comment) Type of Home: House Home Access: Stairs to enter [employer] of Steps: 3 Entrance Stairs-Rails: Right Home Layout: One level Home Equipment: Walker - 2 wheels;Shower seat;Cane - single point Prior  Function Level of Independence:  Independent with assistive device(s) Comments: uses walker outside the home and cane inside Communication Communication: No difficulties    Cognition  Cognition Arousal/Alertness: Awake/alert Behavior During Therapy: WFL for tasks assessed/performed Overall Cognitive Status: Within Functional Limits for tasks assessed    Extremity/Trunk Assessment Upper Extremity Assessment Upper Extremity Assessment: Overall WFL for tasks assessed Lower Extremity Assessment Lower Extremity Assessment: RLE deficits/detail RLE Deficits / Details: oted atrophy and decreased active dorsiflexion. Able to bear weight, genu recurvatum.   Balance Balance Balance Assessed: Yes Static Sitting Balance Static Sitting - Level of Assistance: 5: Stand by assistance Static Standing Balance Static Standing - Balance Support: Right upper extremity supported Static Standing - Level of Assistance: 5: Stand by assistance;4: Min assist Static Standing - Comment/# of Minutes: initially posterior lean  after standing up from recliner.  End of Session PT - End of Session Equipment Utilized During Treatment: Gait belt Activity Tolerance: Patient tolerated treatment well Patient left: in bed;with call bell/phone within reach  GP     Rada Hay 05/14/2013, 12:41 PM Blanchard Kelch PT [phone]

## 2013-05-14 NOTE — Progress Notes (Signed)
Subjective: Perc chole drain placed 11/25 Feels some better Less abd pain Still complains of constipation  Objective: Vital signs in last 24 hours: Temp:  [98.1 F (36.7 C)-101.3 F (38.5 C)] 99.7 F (37.6 C) (11/26 0458) Pulse Rate:  [95-113] 103 (11/26 0458) Resp:  [14-24] 15 (11/26 0458) BP: (136-185)/(55-104) 155/71 mmHg (11/26 0458) SpO2:  [86 %-100 %] 94 % (11/26 0458) Weight:  [152 lb 5.4 oz (69.1 kg)] 152 lb 5.4 oz (69.1 kg) (11/25 1420) Last BM Date: 05/10/13  Intake/Output from previous day: 11/25 0701 - 11/26 0700 In: 1080 [I.V.:1080] Out: 351 [Urine:1; Drains:350] Intake/Output this shift:    PE:  T: 99.7 vss Wbc 18 (18.9) Output 350 cc since placement- bloody Site of drain clean and dry NT No bleeding cx pending   Lab Results:   Recent Labs  05/13/13 0935 05/14/13 0340  WBC 18.9* 18.0*  HGB 10.6* 9.7*  HCT 31.9* 29.0*  PLT 236 226   BMET  Recent Labs  05/13/13 0935 05/14/13 0340  NA 132* 132*  K 4.2 4.6  CL 95* 98  CO2 26 22  GLUCOSE 62* 155*  BUN 22 30*  CREATININE 1.45* 1.61*  CALCIUM 9.5 8.5   PT/INR  Recent Labs  05/13/13 1431  LABPROT 16.5*  INR 1.37   ABG No results found for this basename: PHART, PCO2, PO2, HCO3,  in the last 72 hours  Studies/Results: Ct Abdomen Pelvis W Contrast  05/13/2013   CLINICAL DATA:  Nausea and vomiting and abdominal distention and pain with fever. There is history of small bowel obstruction.  EXAM: CT ABDOMEN AND PELVIS WITH CONTRAST  TECHNIQUE: Multidetector CT imaging of the abdomen and pelvis was performed using the standard protocol following bolus administration of intravenous contrast.  CONTRAST:  OMNIPAQUE IOHEXOL 300 MG/ML SOLN intravenously. The patient also received oral contrast material.  COMPARISON:  Acute abdominal series dated May 13, 2013 and CT scan of the abdomen and pelvis dated 10 May 2013  FINDINGS: The gallbladder is quite distended today. Mild  gallbladder wall thickening is visible though there is no pericholecystic fluid. No stones are evident. The liver exhibits no focal mass nor ductal dilation. The pancreas, spleen, and partially distended stomach appear normal. The adrenal glands are normal in density and contour. The kidneys exhibit no evidence of obstruction. There is a nonobstructing 2 mm diameter lower pole stone on the right. There are numerous cortical hypodensities compatible with cysts. The perinephric fat is normal in appearance. The urinary bladder is partially distended and grossly normal. The enlarged prostate gland produces a prominent impression upon the urinary bladder base. There is no inguinal nor umbilical hernia. The psoas musculature is normal in appearance. The caliber of the abdominal aorta is normal. There is no periaortic or pericaval lymphadenopathy.  There is a large amount of stool in the ascending and transverse and proximal descending portions of the colon. This is similar in appearance to that demonstrated on the earlier study of 22 November. The mid and distal descending colon exhibit a moderate amount of stool but are not quite as distended as the more proximal portions of the colon. No bowel wall thickening is demonstrated to suggest colitis. There is no evidence of an annular constricting lesion. There are now all loops of mildly distended distal small bowel in the lower abdomen and upper pelvis. The appendix is again demonstrated and contains an appendicolith, it but does not appear edematous nor is there surrounding inflammatory change. The appendix is  demonstrated best on the coronal reconstructed images, but in the axial plane it is demonstrated on images 57 through 66 of series 2.  Atelectasis versus early pneumonia in both lower lobes has developed since the previous study. There are small bilateral pleural effusions as well. Again demonstrated is chronic atrophy of the right gluteal musculature. The lumbar  vertebral bodies are preserved in height. The intervertebral disc space heights appear reasonably well maintained.  IMPRESSION: 1. A large amount of stool is present within the colon as described. This pattern is similar to that seen on the CT scan of May 10, 2013 and is consistent with severe constipation. No annular constricting lesion or other obstructive process is demonstrated. There are no bowel wall changes to suggest colitis or diverticulitis. Distention of distal small bowel loops has developed since the earlier study. 2. Distention of the gallbladder is more prominent today and there is mild gallbladder wall thickening which may reflect development of acalculus cholecystitis. 3. There is new bibasilar atelectasis versus early pneumonia in the lower lobes posteriorly. Small bilateral pleural effusions have developed as well. 4. There is no evidence of urinary tract obstruction or acute inflammation. Renal cystic lesions are present bilaterally. Prostatic enlargement is again demonstrated.   Electronically Signed   By: David  [place]   On: 05/13/2013 11:43   US Abdomen Limited  05/13/2013   CLINICAL DATA:  Right upper quadrant abdominal discomfort and fever, distended gallbladder on recent CT scan  EXAM: LIMITED ABDOMEN ULTRASOUND FOR ASCITES  COMPARISON:  CT scan of the abdomen and pelvis of today's date.  FINDINGS: Gallbladder  The gallbladder is moderately distended. There are echogenic stones as well as echogenic bile present. The stones appear mobile. The gallbladder wall is mildly thickened at 4.7 mm. There is no pericholecystic fluid nor positive sonographic Murphy's sign.  Common bile duct  Diameter: 3.3 mm.  Liver  No focal lesion identified. Within normal limits in parenchymal echogenicity.  IVC  No abnormality visualized.  Pancreas  Bowel gas obscures the pancreas.  Spleen  Size and appearance within normal limits.  Right Kidney  Length: 9 cm. Echogenicity within normal limits. Multiple  cysts are present with the largest lying in the midpole laterally measuring 1.8 x 2 x 1.7 cm.  Left Kidney  Length: 9.8 cm. Echogenicity within normal limits. A lower pole cyst on the left measures 2.3 x 2.1 x 1.9 cm.  Abdominal aorta  The abdominal aorta is obscured by bowel gas.  IMPRESSION: 1. The gallbladder is distended and exhibits wall thickening with multiple tiny stones as well as echogenic bile. There is no positive sonographic Murphy's sign or definite pericholecystic fluid. 2. No acute abnormality of the liver or common bile duct is demonstrated. The pancreas is obscured by bowel gas. 3. There are simple appearing cysts in both kidneys. There is no hydronephrosis.   Electronically Signed   By: David  [place]   On: 05/13/2013 12:54   Ir Perc Cholecystostomy  05/13/2013   CLINICAL DATA:  77 year old with acute cholecystitis. The patient is not a good surgical candidate right now due to abdominal distention from fecal impaction.  EXAM: PERCUTANEOUS CHOLECYSTOSTOMY TUBE PLACEMENT WITH ULTRASOUND AND FLUOROSCOPIC GUIDANCE  Physician: Rachelle Hora. Lowella Dandy, MD  MEDICATIONS: Fentanyl 25 mcg IV. A radiology nurse monitored the patient throughout the procedure.  FLUOROSCOPY TIME:  12 seconds  PROCEDURE: Informed consent was obtained for percutaneous cholecystostomy tube placement. Patient was placed supine on the interventional table. Ultrasound demonstrated a dilated gallbladder. The  right side of the abdomen was prepped and draped in sterile fashion. Maximal barrier sterile technique was utilized including caps, mask, sterile gowns, sterile gloves, sterile drape, hand hygiene and skin antiseptic. The right side of the abdomen was anesthetized with 1% lidocaine. Using ultrasound guidance, a 22 gauge needle was directed into the distended gallbladder from a transhepatic approach. A 0.018 wire was advanced into the gallbladder. The tract was dilated to accommodate a 10 [place] multipurpose drain. Dark black fluid was  aspirated from the gallbladder. Catheter was sutured to the skin and attached to a gravity bag. A sample of the bile was sent for Gram stain and culture. Fluoroscopic and ultrasound images were taken and saved for documentation.  COMPLICATIONS: None  FINDINGS: Distended gallbladder.  Successful placement of gallbladder drain.  IMPRESSION: Successful cholecystostomy tube placement with ultrasound and fluoroscopic guidance. Sample of the fluid was sent for Gram stain and culture.   Electronically Signed   By: Richarda Overlie M.D.   On: 05/13/2013 18:05   Dg Abd Acute W/chest  05/13/2013   CLINICAL DATA:  Patient is complaining of abdominal pain and distension and has a history of small bowel obstruction.  EXAM: ACUTE ABDOMEN SERIES (ABDOMEN 2 VIEW & CHEST 1 VIEW)  COMPARISON:  CT scan of the abdomen and pelvis dated May 10, 2013.  FINDINGS: The lungs are mildly hypoinflated. The interstitial markings are mildly increased but this is in part due to vascular crowding. There is a trace of blunting of the left lateral costophrenic angle. The cardiac silhouette is mildly enlarged and the central pulmonary vascularity is prominent. Within the abdomen there is a large amount of stool throughout the ascending and transverse and descending portions of the colon. Proximal to this there are a few loops of mildly distended gas-filled small bowel. These lie principally within the pelvis. There is no more than a small amount of gas in the rectum. No free extraluminal gas collections are demonstrated. No acute bony abnormality is demonstrated. There is chronic rotatory scoliosis of the lumbar spine.  IMPRESSION: 1. The bowel gas pattern is consistent with constipation. Minimal distention of small bowel loops with gas is demonstrated as well. 2. The chest film reveals a small left pleural effusion and mild prominence of the pulmonary interstitium and pulmonary vascularity suggesting low-grade CHF.   Electronically Signed   By:  David  [place]   On: 05/13/2013 11:12    Anti-infectives: Anti-infectives   Start     Dose/Rate Route Frequency Ordered Stop   05/13/13 2000  piperacillin-tazobactam (ZOSYN) IVPB 3.375 g     3.375 g 12.5 mL/hr over 240 Minutes Intravenous Every 8 hours 05/13/13 1603     05/13/13 1215  levofloxacin (LEVAQUIN) tablet 500 mg  Status:  Discontinued     500 mg Oral  Once 05/13/13 1213 05/13/13 1237   05/13/13 1200  piperacillin-tazobactam (ZOSYN) IVPB 3.375 g  Status:  Discontinued     3.375 g 12.5 mL/hr over 240 Minutes Intravenous  Once 05/13/13 1157 05/13/13 1158   05/13/13 1200  piperacillin-tazobactam (ZOSYN) IVPB 3.375 g     3.375 g 100 mL/hr over 30 Minutes Intravenous STAT 05/13/13 1159 05/13/13 1302   05/13/13 1000  vancomycin (VANCOCIN) IVPB 1000 mg/200 mL premix     1,000 mg 200 mL/hr over 60 Minutes Intravenous  Once 05/13/13 1610 05/13/13 1214      Assessment/Plan: s/p * No surgery found *  Perc Chole placed 11/25 Feels better Good output- bloody cx pending Will  follow few days Need to stay in 4-6 weeks at least Plan per [facility]   LOS: 1 day    Chrisopher Pustejovsky A 05/14/2013

## 2013-05-14 NOTE — Evaluation (Signed)
Occupational Therapy Evaluation Patient Details Name: Mike Mack MRN: [id number] DOB: 09-Jul-1935 Today's Date: 05/14/2013 Time: 4098-1191 OT Time Calculation (min): 25 min  OT Assessment / Plan / Recommendation History of present illness With history of hypertension, diabetes mellitus, prior history of small bowel obstruction, BPH, history of stroke presented to the ED with 3 to four-day history of right upper quadrant pain, abdominal distention, chronic constipation, generalized weakness, nausea vomiting. Patient was seen in the ED and noted to be febrile on a 1.3. Patient also with complaints of right flank pain. Pt is now s/p percutaneous cholecystostomy   Clinical Impression   Pt with history of polio and presents to OT with decreased independence with functional transfers and ADL. He will benefit from skilled OT services to maximize ADL independence for d/c next venue. Pt states his wife works and is home alone during the day till she returns home around 3:30. Feel he will need SNF to increase independence before return home alone during the day.    OT Assessment  Patient needs continued OT Services    Follow Up Recommendations  SNF;Supervision/Assistance - 24 hour    Barriers to Discharge      Equipment Recommendations  3 in 1 bedside comode    Recommendations for Other Services    Frequency  Min 2X/week    Precautions / Restrictions Precautions Precautions: Fall   Pertinent Vitals/Pain 5/10 abdomen; informed nursing.    ADL  Grooming: Performed;Wash/dry hands;Supervision/safety;Set up Where Assessed - Grooming: Unsupported sitting Upper Body Bathing: Simulated;Chest;Right arm;Left arm;Abdomen;Supervision/safety;Set up Where Assessed - Upper Body Bathing: Unsupported sitting Lower Body Bathing: Simulated;Moderate assistance Where Assessed - Lower Body Bathing: Supported sit to stand Upper Body Dressing: Simulated;Minimal assistance Where Assessed - Upper Body  Dressing: Unsupported sitting Lower Body Dressing: Simulated;Moderate assistance Where Assessed - Lower Body Dressing: Supported sit to Pharmacist, hospital: Simulated;Moderate assistance Toilet Transfer Method: Stand pivot Toileting - Clothing Manipulation and Hygiene: Simulated;Moderate assistance Where Assessed - Toileting Clothing Manipulation and Hygiene: Sit to stand from 3-in-1 or toilet Equipment Used: Rolling walker ADL Comments: Pt stood to use urinal but had to sit down before completely done with urinal as his R LE started to feel weak. Pivoted over to chair with walker. Pt needs verbal cues for hand placement as he tends to pull on walker to stand. Pt states his R LE sometimes gives him difficulty at home and gets weak. Talked with pt about considering SNF at d/c as his wife works and isnt home till 3:30.    OT Diagnosis: Generalized weakness  OT Problem List: Decreased strength;Decreased knowledge of use of DME or AE OT Treatment Interventions: Self-care/ADL training;DME and/or AE instruction;Therapeutic activities;Patient/family education   OT Goals(Current goals can be found in the care plan section) Acute Rehab OT Goals Patient Stated Goal: get stronger OT Goal Formulation: With patient Time For Goal Achievement: 05/28/13 Potential to Achieve Goals: Good  Visit Information  Last OT Received On: 05/14/13 History of Present Illness: With history of hypertension, diabetes mellitus, prior history of small bowel obstruction, BPH, history of stroke presented to the ED with 3 to four-day history of right upper quadrant pain, abdominal distention, chronic constipation, generalized weakness, nausea vomiting. Patient was seen in the ED and noted to be febrile on a 1.3. Patient also with complaints of right flank pain. Pt is now s/p percutaneous cholecystostomy       Prior Functioning     Home Living Family/patient expects to be discharged to:: Skilled nursing facility  Living  Arrangements: Spouse/significant other Available Help at Discharge: Family;Other (Comment) (wife works till [employer] and daugther leaves their house around 8 in mornings. home alone during day) Type of Home: House Home Access: Stairs to enter [employer] of Steps: 3 Entrance Stairs-Rails: Right Home Layout: One level Home Equipment: Walker - 2 wheels;Shower seat;Cane - single point Prior Function Level of Independence: Independent with assistive device(s) Comments: uses walker outside the home and cane inside Communication Communication: No difficulties         Vision/Perception     Cognition  Cognition Arousal/Alertness: Awake/alert Behavior During Therapy: WFL for tasks assessed/performed Overall Cognitive Status: Within Functional Limits for tasks assessed    Extremity/Trunk Assessment Upper Extremity Assessment Upper Extremity Assessment: Overall WFL for tasks assessed     Mobility Bed Mobility Bed Mobility: Rolling Left;Left Sidelying to Sit Rolling Left: 4: Min assist Left Sidelying to Sit: 3: Mod assist;HOB elevated Transfers Transfers: Sit to Stand;Stand to Sit Sit to Stand: 3: Mod assist;With upper extremity assist;From bed Stand to Sit: 4: Min assist;With upper extremity assist;To chair/3-in-1 Details for Transfer Assistance: verbal cues for hand placement. assist to rise and steady and then control descent.     Exercise     Balance Balance Balance Assessed: Yes Static Sitting Balance Static Sitting - Level of Assistance: 5: Stand by assistance   End of Session OT - End of Session Equipment Utilized During Treatment: Rolling walker Activity Tolerance: Patient tolerated treatment well Patient left: in chair;with call bell/phone within reach  GO     Lennox Laity [phone] 05/14/2013, 9:44 AM

## 2013-05-14 NOTE — Progress Notes (Signed)
Clinical Social Work Department BRIEF PSYCHOSOCIAL ASSESSMENT 05/14/2013  Patient:  Mike Mack, Mike Mack     Account Number:  [id number]     Admit date:  05/13/2013  Clinical Social Worker:  Jacelyn Grip  Date/Time:  05/14/2013 03:30 PM  Referred by:  Physician  Date Referred:  05/14/2013 Referred for  SNF Placement   Other Referral:   Interview type:  Patient Other interview type:    PSYCHOSOCIAL DATA Living Status:  WIFE Admitted from facility:   Level of care:   Primary support name:  [name] Dauphinais/wife/[phone] Primary support relationship to patient:  SPOUSE Degree of support available:   adequate    CURRENT CONCERNS Current Concerns  Post-Acute Placement   Other Concerns:    SOCIAL WORK ASSESSMENT / PLAN CSW received referral for New SNF placement.    CSW met with pt at bedside to discuss discharge planning needs. CSW discussed recommendation for rehab at [facility]. Pt expressed understanding. Pt agreeable to SNF search in [facility]. Pt stated that he has been to [facility] in the past and may be interested in returning, but agreeable to full [facility] search.    CSW completed FL2 and initiated SNF search to [facility]    CSW to follow up with pt and pt spouse with bed offers.    CSW to continue to follow and facilitate pt discharge needs when pt medically ready for discharge.   Assessment/plan status:  Psychosocial Support/Ongoing Assessment of Needs Other assessment/ plan:   discharge planning   Information/referral to community resources:   [facility] list    PATIENT'S/FAMILY'S RESPONSE TO PLAN OF CARE: Pt alert and oriented x 4. Pt pleasant and agreeable to plan for SNF upon discharge. Pt was familiar and receptive to SNF process.    Jacklynn Lewis, MSW, LCSWA  Clinical Social Work [phone]

## 2013-05-14 NOTE — Progress Notes (Signed)
This RN was successful in flushing biliary drain. Serosanguinous fluid immediately returned, then sanguinous fluid began to drain again. Pt experiences some discomfort during procedure. This RN to continue to monitor. Eugene Garnet RN

## 2013-05-14 NOTE — Progress Notes (Signed)
[facility] PROGRESS NOTE  Nilesh Stegall ZOX:[id number] DOB: [birth year] DOA: 05/13/2013 PCP: Michiel Sites, MD  Brief narrative: 77 year old male with past medical history of DM, small bowel obstruction, BPH, recent enterococcal UTI who presented to [facility] ED 05/13/2013 with ongoing RUQ abdominal pain, N/V, constipation and generalized weakness for past few days prior to this admission. In ED, patient was noted to be febrile with Tmax of 101.46F. CBC revealed WBC count of 18.9, hemoglobin of 10.6 and normal platelet count. BMP revealed sodium of 132 and creatinine of 1.45. Lipase level was 9 on this admission. Normal LFT's. Acute abdominal series were significant for constipation. Abd US revealed distended gallbladder but no pericholecystic fluid evident. CT abdomen revealed a large amount of stool present within the colon consistent with severe constipation; distention of the gallbladder with mild gallbladder wall thickening which may reflect development of acalculus cholecystitis; possible early pneumonia in the lower lobes posteriorly. Patient had biliary drain placed by IR with no subsequent complications. He was started on broad spectrum antibiotics on the admission.  Assessment/Plan:  Principal Problem:   Acute cholecystitis - status post percutaneous biliary drain placement 05/13/2013 by IR - continue broad spectrum antibiotics, vancomycin and zosyn - blood cultures and CBD fluid cultures negative to date - appreciated IR and surgery following Active Problems:   Severe constipation, abdominal distention - colace BID and miralax TID, enema PRN   Diabetes mellitus without complication, uncontrolled - A1c 7.8 on this admission - pt is on Levemir 50 units BID at home and metformin - in the setting of an acute infection and renal failure  I would continue to hold metformin - restart insulin as po intake improves - for now sliding scale only - CBG's in past 24 hours: 74, 101, 161    Hypertension - continue norvasc 5 mg daily, avapro 300 mg at bedtime   BPH (benign prostatic hyperplasia) - continue Flomax 0.4 mg daily   Enterococcus UTI - on vanco and zosyn    Leukocytosis - due to acute cholecystitis and/or UTI - on vanco and zosyn - continue to monitor WBC count trend   Acute renal failure - likely prerenal, dehydration or metformin - watch for possible vanco toxicity - metformin on hold   Anemia of chronic disease - related to history of diabetes - hemoglobin 9.7; continue to monitor CBC   H/O CVA - on aspirin for secondary stroke prevention  Code Status: full code Family Communication: no family at the bedside Disposition Plan: home when stable; PT recommended SNF so order placed for SW to assist with D/C plan  Manson Passey, MD  [facility] Pager [phone]  If 7PM-7AM, please contact night-coverage [email or web address] Password [email or web address] 05/14/2013, 10:34 AM   LOS: 1 day   Consultants:  Surgery  Interventional radiology  Procedures:  Percutaneous cholecystostomy 05/13/2013 by IR  Antibiotics:  Vancomycin 05/13/2013 -->   Zosyn 05/13/2013 -->  HPI/Subjective: Feels better this am.  Objective: Filed Vitals:   05/13/13 1855 05/13/13 1925 05/13/13 1955 05/14/13 0458  BP: 176/78 158/82 161/79 155/71  Pulse: 106 106 105 103  Temp: 98.8 F (37.1 C) 99.4 F (37.4 C) 99.3 F (37.4 C) 99.7 F (37.6 C)  TempSrc: Oral Oral Oral Oral  Resp: 20 18 16 15   Height:      Weight:      SpO2: 97% 97% 97% 94%    Intake/Output Summary (Last 24 hours) at 05/14/13 1034 Last data filed at 05/14/13 0600  Gross per 24 hour  Intake   1080 ml  Output    351 ml  Net    729 ml    Exam:   General:  Pt is alert, follows commands appropriately, not in acute distress  Cardiovascular: Regular rate and rhythm, S1/S2 appreciated  Respiratory: Clear to auscultation bilaterally, no wheezing, no crackles, no rhonchi  Abdomen: per drain in place; abd  distended, tender across mid abdomen, bowel sounds present, no guarding  Extremities: Pulses DP and PT palpable bilaterally  Neuro: Grossly nonfocal  Data Reviewed: Basic Metabolic Panel:  Recent Labs Lab 05/10/13 0055 05/13/13 0935 05/14/13 0340  NA 134* 132* 132*  K 4.7 4.2 4.6  CL 98 95* 98  CO2 26 26 22   GLUCOSE 186* 62* 155*  BUN 17 22 30*  CREATININE 1.09 1.45* 1.61*  CALCIUM 9.3 9.5 8.5  MG  --  2.6*  --    Liver Function Tests:  Recent Labs Lab 05/13/13 0935 05/14/13 0340  AST 26 48*  ALT 16 32  ALKPHOS 87 86  BILITOT 0.9 1.2  PROT 7.5 6.6  ALBUMIN 3.3* 2.7*    Recent Labs Lab 05/13/13 0935  LIPASE 9*   No results found for this basename: AMMONIA,  in the last 168 hours CBC:  Recent Labs Lab 05/10/13 0055 05/13/13 0935 05/14/13 0340  WBC 7.6 18.9* 18.0*  NEUTROABS 5.4 16.8*  --   HGB 11.5* 10.6* 9.7*  HCT 35.3* 31.9* 29.0*  MCV 91.2 89.4 88.7  PLT 247 236 226   Cardiac Enzymes: No results found for this basename: CKTOTAL, CKMB, CKMBINDEX, TROPONINI,  in the last 168 hours BNP: No components found with this basename: POCBNP,  CBG:  Recent Labs Lab 05/13/13 0921 05/13/13 1820 05/13/13 2117 05/14/13 0731  GLUCAP 55* 74 101* 161*    Recent Results (from the past 240 hour(s))  URINE CULTURE     Status: None   Collection Time    05/10/13  1:27 AM      Result Value Range Status   Specimen Description URINE, RANDOM   Final   Special Requests NONE   Final   Culture  Setup Time     Final   Value: 05/10/2013 14:29     Performed at [facility] Count     Final   Value: >=100,000 COLONIES/ML     Performed at [facility]   Culture     Final   Value: ENTEROCOCCUS SPECIES     Performed at [facility]   Report Status 05/12/2013 FINAL   Final   Organism ID, Bacteria ENTEROCOCCUS SPECIES   Final  CULTURE, ROUTINE-ABSCESS     Status: None   Collection Time    05/13/13  5:47 PM      Result Value Range  Status   Specimen Description OTHER   Final   Special Requests NONE   Final   Gram Stain     Final   Value: NO WBC SEEN     NO SQUAMOUS EPITHELIAL CELLS SEEN     NO ORGANISMS SEEN     Performed at [facility]   Culture     Final   Value: NO GROWTH 1 DAY     Performed at [facility]   Report Status PENDING   Incomplete  CULTURE, BLOOD (ROUTINE X 2)     Status: None   Collection Time    05/13/13  6:00 PM      Result Value Range  Status   Specimen Description BLOOD LEFT HAND   Final   Special Requests BOTTLES DRAWN AEROBIC AND ANAEROBIC 10CC   Final   Culture  Setup Time     Final   Value: 05/13/2013 22:00     Performed at [facility]   Culture     Final   Value:        BLOOD CULTURE RECEIVED NO GROWTH TO DATE CULTURE WILL BE HELD FOR 5 DAYS BEFORE ISSUING A FINAL NEGATIVE REPORT     Performed at [facility]   Report Status PENDING   Incomplete  BODY FLUID CULTURE     Status: None   Collection Time    05/13/13  6:12 PM      Result Value Range Status   Specimen Description COMMON BILE DUCT   Final   Special Requests NONE   Final   Gram Stain     Final   Value: NO WBC SEEN     NO ORGANISMS SEEN     Performed at [facility]   Culture     Final   Value: NO GROWTH 1 DAY     Performed at [facility]   Report Status PENDING   Incomplete  CULTURE, BLOOD (ROUTINE X 2)     Status: None   Collection Time    05/13/13  6:15 PM      Result Value Range Status   Specimen Description BLOOD RIGHT ARM   Final   Special Requests BOTTLES DRAWN AEROBIC AND ANAEROBIC 10CC   Final   Culture  Setup Time     Final   Value: 05/13/2013 22:00     Performed at [facility]   Culture     Final   Value:        BLOOD CULTURE RECEIVED NO GROWTH TO DATE CULTURE WILL BE HELD FOR 5 DAYS BEFORE ISSUING A FINAL NEGATIVE REPORT     Performed at [facility]   Report Status PENDING   Incomplete     Studies: Ct Abdomen Pelvis W  Contrast  05/13/2013   CLINICAL DATA:  Nausea and vomiting and abdominal distention and pain with fever. There is history of small bowel obstruction.  EXAM: CT ABDOMEN AND PELVIS WITH CONTRAST  TECHNIQUE: Multidetector CT imaging of the abdomen and pelvis was performed using the standard protocol following bolus administration of intravenous contrast.  CONTRAST:  OMNIPAQUE IOHEXOL 300 MG/ML SOLN intravenously. The patient also received oral contrast material.  COMPARISON:  Acute abdominal series dated May 13, 2013 and CT scan of the abdomen and pelvis dated 10 May 2013  FINDINGS: The gallbladder is quite distended today. Mild gallbladder wall thickening is visible though there is no pericholecystic fluid. No stones are evident. The liver exhibits no focal mass nor ductal dilation. The pancreas, spleen, and partially distended stomach appear normal. The adrenal glands are normal in density and contour. The kidneys exhibit no evidence of obstruction. There is a nonobstructing 2 mm diameter lower pole stone on the right. There are numerous cortical hypodensities compatible with cysts. The perinephric fat is normal in appearance. The urinary bladder is partially distended and grossly normal. The enlarged prostate gland produces a prominent impression upon the urinary bladder base. There is no inguinal nor umbilical hernia. The psoas musculature is normal in appearance. The caliber of the abdominal aorta is normal. There is no periaortic or pericaval lymphadenopathy.  There is a large amount of stool in  the ascending and transverse and proximal descending portions of the colon. This is similar in appearance to that demonstrated on the earlier study of 22 November. The mid and distal descending colon exhibit a moderate amount of stool but are not quite as distended as the more proximal portions of the colon. No bowel wall thickening is demonstrated to suggest colitis. There is no evidence of an annular  constricting lesion. There are now all loops of mildly distended distal small bowel in the lower abdomen and upper pelvis. The appendix is again demonstrated and contains an appendicolith, it but does not appear edematous nor is there surrounding inflammatory change. The appendix is demonstrated best on the coronal reconstructed images, but in the axial plane it is demonstrated on images 57 through 66 of series 2.  Atelectasis versus early pneumonia in both lower lobes has developed since the previous study. There are small bilateral pleural effusions as well. Again demonstrated is chronic atrophy of the right gluteal musculature. The lumbar vertebral bodies are preserved in height. The intervertebral disc space heights appear reasonably well maintained.  IMPRESSION: 1. A large amount of stool is present within the colon as described. This pattern is similar to that seen on the CT scan of May 10, 2013 and is consistent with severe constipation. No annular constricting lesion or other obstructive process is demonstrated. There are no bowel wall changes to suggest colitis or diverticulitis. Distention of distal small bowel loops has developed since the earlier study. 2. Distention of the gallbladder is more prominent today and there is mild gallbladder wall thickening which may reflect development of acalculus cholecystitis. 3. There is new bibasilar atelectasis versus early pneumonia in the lower lobes posteriorly. Small bilateral pleural effusions have developed as well. 4. There is no evidence of urinary tract obstruction or acute inflammation. Renal cystic lesions are present bilaterally. Prostatic enlargement is again demonstrated.   Electronically Signed   By: David  [place]   On: 05/13/2013 11:43   US Abdomen Limited  05/13/2013   CLINICAL DATA:  Right upper quadrant abdominal discomfort and fever, distended gallbladder on recent CT scan  EXAM: LIMITED ABDOMEN ULTRASOUND FOR ASCITES  COMPARISON:  CT  scan of the abdomen and pelvis of today's date.  FINDINGS: Gallbladder  The gallbladder is moderately distended. There are echogenic stones as well as echogenic bile present. The stones appear mobile. The gallbladder wall is mildly thickened at 4.7 mm. There is no pericholecystic fluid nor positive sonographic Murphy's sign.  Common bile duct  Diameter: 3.3 mm.  Liver  No focal lesion identified. Within normal limits in parenchymal echogenicity.  IVC  No abnormality visualized.  Pancreas  Bowel gas obscures the pancreas.  Spleen  Size and appearance within normal limits.  Right Kidney  Length: 9 cm. Echogenicity within normal limits. Multiple cysts are present with the largest lying in the midpole laterally measuring 1.8 x 2 x 1.7 cm.  Left Kidney  Length: 9.8 cm. Echogenicity within normal limits. A lower pole cyst on the left measures 2.3 x 2.1 x 1.9 cm.  Abdominal aorta  The abdominal aorta is obscured by bowel gas.  IMPRESSION: 1. The gallbladder is distended and exhibits wall thickening with multiple tiny stones as well as echogenic bile. There is no positive sonographic Murphy's sign or definite pericholecystic fluid. 2. No acute abnormality of the liver or common bile duct is demonstrated. The pancreas is obscured by bowel gas. 3. There are simple appearing cysts in both kidneys. There is no  hydronephrosis.   Electronically Signed   By: David  [place]   On: 05/13/2013 12:54   Ir Perc Cholecystostomy  05/13/2013   CLINICAL DATA:  77 year old with acute cholecystitis. The patient is not a good surgical candidate right now due to abdominal distention from fecal impaction.  EXAM: PERCUTANEOUS CHOLECYSTOSTOMY TUBE PLACEMENT WITH ULTRASOUND AND FLUOROSCOPIC GUIDANCE  Physician: Rachelle Hora. Lowella Dandy, MD  MEDICATIONS: Fentanyl 25 mcg IV. A radiology nurse monitored the patient throughout the procedure.  FLUOROSCOPY TIME:  12 seconds  PROCEDURE: Informed consent was obtained for percutaneous cholecystostomy tube placement.  Patient was placed supine on the interventional table. Ultrasound demonstrated a dilated gallbladder. The right side of the abdomen was prepped and draped in sterile fashion. Maximal barrier sterile technique was utilized including caps, mask, sterile gowns, sterile gloves, sterile drape, hand hygiene and skin antiseptic. The right side of the abdomen was anesthetized with 1% lidocaine. Using ultrasound guidance, a 22 gauge needle was directed into the distended gallbladder from a transhepatic approach. A 0.018 wire was advanced into the gallbladder. The tract was dilated to accommodate a 10 [place] multipurpose drain. Dark black fluid was aspirated from the gallbladder. Catheter was sutured to the skin and attached to a gravity bag. A sample of the bile was sent for Gram stain and culture. Fluoroscopic and ultrasound images were taken and saved for documentation.  COMPLICATIONS: None  FINDINGS: Distended gallbladder.  Successful placement of gallbladder drain.  IMPRESSION: Successful cholecystostomy tube placement with ultrasound and fluoroscopic guidance. Sample of the fluid was sent for Gram stain and culture.   Electronically Signed   By: Richarda Overlie M.D.   On: 05/13/2013 18:05   Dg Abd Acute W/chest  05/13/2013   CLINICAL DATA:  Patient is complaining of abdominal pain and distension and has a history of small bowel obstruction.  EXAM: ACUTE ABDOMEN SERIES (ABDOMEN 2 VIEW & CHEST 1 VIEW)  COMPARISON:  CT scan of the abdomen and pelvis dated May 10, 2013.  FINDINGS: The lungs are mildly hypoinflated. The interstitial markings are mildly increased but this is in part due to vascular crowding. There is a trace of blunting of the left lateral costophrenic angle. The cardiac silhouette is mildly enlarged and the central pulmonary vascularity is prominent. Within the abdomen there is a large amount of stool throughout the ascending and transverse and descending portions of the colon. Proximal to this there are  a few loops of mildly distended gas-filled small bowel. These lie principally within the pelvis. There is no more than a small amount of gas in the rectum. No free extraluminal gas collections are demonstrated. No acute bony abnormality is demonstrated. There is chronic rotatory scoliosis of the lumbar spine.  IMPRESSION: 1. The bowel gas pattern is consistent with constipation. Minimal distention of small bowel loops with gas is demonstrated as well. 2. The chest film reveals a small left pleural effusion and mild prominence of the pulmonary interstitium and pulmonary vascularity suggesting low-grade CHF.   Electronically Signed   By: David  [place]   On: 05/13/2013 11:12    Scheduled Meds: . amLODipine  5 mg Oral q morning - 10a  . aspirin  325 mg Oral Daily  . darifenacin  15 mg Oral Daily  . docusate sodium  100 mg Oral BID  . enoxaparin (LOVENOX) injection  40 mg Subcutaneous Q24H  . insulin aspart  0-9 Units Subcutaneous TID WC  . irbesartan  300 mg Oral QHS  . latanoprost  1 drop Both  Eyes QHS  . milk and molasses   Rectal Once  . milk and molasses   Rectal BID  . pantoprazole (PROTONIX) IV  40 mg Intravenous Q24H  . piperacillin-tazobactam (ZOSYN)  IV  3.375 g Intravenous Q8H  . polyethylene glycol  34 g Oral TID  . predniSONE  6 mg Oral Q breakfast  . tamsulosin  0.4 mg Oral BID   Continuous Infusions: . sodium chloride 75 mL/hr at 05/13/13 1536

## 2013-05-15 LAB — CBC
HCT: 28.1 % — ABNORMAL LOW (ref 39.0–52.0)
Hemoglobin: 9.7 g/dL — ABNORMAL LOW (ref 13.0–17.0)
MCH: 30.1 pg (ref 26.0–34.0)
MCHC: 34.5 g/dL (ref 30.0–36.0)
Platelets: 264 10*3/uL (ref 150–400)
WBC: 12.1 10*3/uL — ABNORMAL HIGH (ref 4.0–10.5)

## 2013-05-15 LAB — GLUCOSE, CAPILLARY
Glucose-Capillary: 183 mg/dL — ABNORMAL HIGH (ref 70–99)
Glucose-Capillary: 204 mg/dL — ABNORMAL HIGH (ref 70–99)
Glucose-Capillary: 180 mg/dL — ABNORMAL HIGH (ref 70–99)
Glucose-Capillary: 166 mg/dL — ABNORMAL HIGH (ref 70–99)

## 2013-05-15 LAB — COMPREHENSIVE METABOLIC PANEL
ALT: 34 U/L (ref 0–53)
Albumin: 2.5 g/dL — ABNORMAL LOW (ref 3.5–5.2)
Alkaline Phosphatase: 84 U/L (ref 39–117)
BUN: 38 mg/dL — ABNORMAL HIGH (ref 6–23)
Calcium: 8.4 mg/dL (ref 8.4–10.5)
Chloride: 94 mEq/L — ABNORMAL LOW (ref 96–112)
Creatinine, Ser: 1.71 mg/dL — ABNORMAL HIGH (ref 0.50–1.35)
GFR calc Af Amer: 44 mL/min — ABNORMAL LOW (ref >90)
Glucose, Bld: 182 mg/dL — ABNORMAL HIGH (ref 70–99)
Potassium: 4.4 mEq/L (ref 3.5–5.1)
Sodium: 127 mEq/L — ABNORMAL LOW (ref 135–145)
Total Bilirubin: 0.8 mg/dL (ref 0.3–1.2)
Total Protein: 6.6 g/dL (ref 6.0–8.3)

## 2013-05-15 MED ORDER — MAGNESIUM CITRATE PO SOLN
1.0000 | Freq: Once | ORAL | Status: AC
  Administered 2013-05-15: 1 via ORAL
  Filled 2013-05-15: qty 296

## 2013-05-15 MED ORDER — GUAIFENESIN 100 MG/5ML PO SOLN
200.0000 mg | ORAL | Status: DC | PRN
  Administered 2013-05-15: 200 mg via ORAL
  Filled 2013-05-15: qty 10

## 2013-05-15 NOTE — Progress Notes (Signed)
MD, noticed that biliary drainage was dark red w/ long, stringy blood clots last night.  Difficult to empty bag/measure drainage d/t long clots pulled out of bag.  Do you want to order scheduled flushes of biliary drain?Arvilla Market, Aunna Snooks [place]

## 2013-05-15 NOTE — Progress Notes (Signed)
Patient ID: Mike Mack, male   DOB: 10/04/1935, 77 y.o.   MRN: [id number]  General Surgery - [facility] - Progress Note  Subjective: Patient states he feels better today.  Less pain.  Objective: Vital signs in last 24 hours: Temp:  [98.4 F (36.9 C)-98.6 F (37 C)] 98.4 F (36.9 C) (11/27 0528) Pulse Rate:  [90-95] 95 (11/27 0528) Resp:  [16] 16 (11/27 0528) BP: (122-143)/(67-75) 122/67 mmHg (11/27 0528) SpO2:  [93 %-94 %] 94 % (11/27 0528) Weight:  [163 lb 9.3 oz (74.2 kg)] 163 lb 9.3 oz (74.2 kg) (11/27 0528) Last BM Date: 05/14/13  Intake/Output from previous day: 11/26 0701 - 11/27 0700 In: 2050 [I.V.:1800; IV Piggyback:250] Out: 453 [Urine:202; Drains:250; Stool:1]  Exam: HEENT - clear, not icteric Neck - soft Chest - coarse bilaterally Cor - RRR, rate 90's Abd - markedly distended, tympanitic; BS present; RUQ drain with reddish brown output; minimal tenderness to palpation RUQ Ext - no significant edema  Lab Results:   Recent Labs  05/14/13 0340 05/15/13 0755  WBC 18.0* 12.1*  HGB 9.7* 9.7*  HCT 29.0* 28.1*  PLT 226 264     Recent Labs  05/14/13 0340 05/15/13 0755  NA 132* 127*  K 4.6 4.4  CL 98 94*  CO2 22 22  GLUCOSE 155* 182*  BUN 30* 38*  CREATININE 1.61* 1.71*  CALCIUM 8.5 8.4    Studies/Results: Ct Abdomen Pelvis W Contrast  05/13/2013   CLINICAL DATA:  Nausea and vomiting and abdominal distention and pain with fever. There is history of small bowel obstruction.  EXAM: CT ABDOMEN AND PELVIS WITH CONTRAST  TECHNIQUE: Multidetector CT imaging of the abdomen and pelvis was performed using the standard protocol following bolus administration of intravenous contrast.  CONTRAST:  OMNIPAQUE IOHEXOL 300 MG/ML SOLN intravenously. The patient also received oral contrast material.  COMPARISON:  Acute abdominal series dated May 13, 2013 and CT scan of the abdomen and pelvis dated 10 May 2013  FINDINGS: The  gallbladder is quite distended today. Mild gallbladder wall thickening is visible though there is no pericholecystic fluid. No stones are evident. The liver exhibits no focal mass nor ductal dilation. The pancreas, spleen, and partially distended stomach appear normal. The adrenal glands are normal in density and contour. The kidneys exhibit no evidence of obstruction. There is a nonobstructing 2 mm diameter lower pole stone on the right. There are numerous cortical hypodensities compatible with cysts. The perinephric fat is normal in appearance. The urinary bladder is partially distended and grossly normal. The enlarged prostate gland produces a prominent impression upon the urinary bladder base. There is no inguinal nor umbilical hernia. The psoas musculature is normal in appearance. The caliber of the abdominal aorta is normal. There is no periaortic or pericaval lymphadenopathy.  There is a large amount of stool in the ascending and transverse and proximal descending portions of the colon. This is similar in appearance to that demonstrated on the earlier study of 22 November. The mid and distal descending colon exhibit a moderate amount of stool but are not quite as distended as the more proximal portions of the colon. No bowel wall thickening is demonstrated to suggest colitis. There is no evidence of an annular constricting lesion. There are now all loops of mildly distended distal small bowel in the lower abdomen and upper pelvis. The appendix is again demonstrated and contains an appendicolith, it but does not appear edematous nor is there surrounding inflammatory change. The  appendix is demonstrated best on the coronal reconstructed images, but in the axial plane it is demonstrated on images 57 through 66 of series 2.  Atelectasis versus early pneumonia in both lower lobes has developed since the previous study. There are small bilateral pleural effusions as well. Again demonstrated is chronic atrophy of  the right gluteal musculature. The lumbar vertebral bodies are preserved in height. The intervertebral disc space heights appear reasonably well maintained.  IMPRESSION: 1. A large amount of stool is present within the colon as described. This pattern is similar to that seen on the CT scan of May 10, 2013 and is consistent with severe constipation. No annular constricting lesion or other obstructive process is demonstrated. There are no bowel wall changes to suggest colitis or diverticulitis. Distention of distal small bowel loops has developed since the earlier study. 2. Distention of the gallbladder is more prominent today and there is mild gallbladder wall thickening which may reflect development of acalculus cholecystitis. 3. There is new bibasilar atelectasis versus early pneumonia in the lower lobes posteriorly. Small bilateral pleural effusions have developed as well. 4. There is no evidence of urinary tract obstruction or acute inflammation. Renal cystic lesions are present bilaterally. Prostatic enlargement is again demonstrated.   Electronically Signed   By: David  [place]   On: 05/13/2013 11:43   US Abdomen Limited  05/13/2013   CLINICAL DATA:  Right upper quadrant abdominal discomfort and fever, distended gallbladder on recent CT scan  EXAM: LIMITED ABDOMEN ULTRASOUND FOR ASCITES  COMPARISON:  CT scan of the abdomen and pelvis of today's date.  FINDINGS: Gallbladder  The gallbladder is moderately distended. There are echogenic stones as well as echogenic bile present. The stones appear mobile. The gallbladder wall is mildly thickened at 4.7 mm. There is no pericholecystic fluid nor positive sonographic Murphy's sign.  Common bile duct  Diameter: 3.3 mm.  Liver  No focal lesion identified. Within normal limits in parenchymal echogenicity.  IVC  No abnormality visualized.  Pancreas  Bowel gas obscures the pancreas.  Spleen  Size and appearance within normal limits.  Right Kidney  Length: 9 cm.  Echogenicity within normal limits. Multiple cysts are present with the largest lying in the midpole laterally measuring 1.8 x 2 x 1.7 cm.  Left Kidney  Length: 9.8 cm. Echogenicity within normal limits. A lower pole cyst on the left measures 2.3 x 2.1 x 1.9 cm.  Abdominal aorta  The abdominal aorta is obscured by bowel gas.  IMPRESSION: 1. The gallbladder is distended and exhibits wall thickening with multiple tiny stones as well as echogenic bile. There is no positive sonographic Murphy's sign or definite pericholecystic fluid. 2. No acute abnormality of the liver or common bile duct is demonstrated. The pancreas is obscured by bowel gas. 3. There are simple appearing cysts in both kidneys. There is no hydronephrosis.   Electronically Signed   By: David  [place]   On: 05/13/2013 12:54   Ir Perc Cholecystostomy  05/13/2013   CLINICAL DATA:  77 year old with acute cholecystitis. The patient is not a good surgical candidate right now due to abdominal distention from fecal impaction.  EXAM: PERCUTANEOUS CHOLECYSTOSTOMY TUBE PLACEMENT WITH ULTRASOUND AND FLUOROSCOPIC GUIDANCE  Physician: Rachelle Hora. Lowella Dandy, MD  MEDICATIONS: Fentanyl 25 mcg IV. A radiology nurse monitored the patient throughout the procedure.  FLUOROSCOPY TIME:  12 seconds  PROCEDURE: Informed consent was obtained for percutaneous cholecystostomy tube placement. Patient was placed supine on the interventional table. Ultrasound demonstrated a dilated  gallbladder. The right side of the abdomen was prepped and draped in sterile fashion. Maximal barrier sterile technique was utilized including caps, mask, sterile gowns, sterile gloves, sterile drape, hand hygiene and skin antiseptic. The right side of the abdomen was anesthetized with 1% lidocaine. Using ultrasound guidance, a 22 gauge needle was directed into the distended gallbladder from a transhepatic approach. A 0.018 wire was advanced into the gallbladder. The tract was dilated to accommodate a 10 [place]  multipurpose drain. Dark black fluid was aspirated from the gallbladder. Catheter was sutured to the skin and attached to a gravity bag. A sample of the bile was sent for Gram stain and culture. Fluoroscopic and ultrasound images were taken and saved for documentation.  COMPLICATIONS: None  FINDINGS: Distended gallbladder.  Successful placement of gallbladder drain.  IMPRESSION: Successful cholecystostomy tube placement with ultrasound and fluoroscopic guidance. Sample of the fluid was sent for Gram stain and culture.   Electronically Signed   By: Richarda Overlie M.D.   On: 05/13/2013 18:05   Dg Abd Acute W/chest  05/13/2013   CLINICAL DATA:  Patient is complaining of abdominal pain and distension and has a history of small bowel obstruction.  EXAM: ACUTE ABDOMEN SERIES (ABDOMEN 2 VIEW & CHEST 1 VIEW)  COMPARISON:  CT scan of the abdomen and pelvis dated May 10, 2013.  FINDINGS: The lungs are mildly hypoinflated. The interstitial markings are mildly increased but this is in part due to vascular crowding. There is a trace of blunting of the left lateral costophrenic angle. The cardiac silhouette is mildly enlarged and the central pulmonary vascularity is prominent. Within the abdomen there is a large amount of stool throughout the ascending and transverse and descending portions of the colon. Proximal to this there are a few loops of mildly distended gas-filled small bowel. These lie principally within the pelvis. There is no more than a small amount of gas in the rectum. No free extraluminal gas collections are demonstrated. No acute bony abnormality is demonstrated. There is chronic rotatory scoliosis of the lumbar spine.  IMPRESSION: 1. The bowel gas pattern is consistent with constipation. Minimal distention of small bowel loops with gas is demonstrated as well. 2. The chest film reveals a small left pleural effusion and mild prominence of the pulmonary interstitium and pulmonary vascularity suggesting  low-grade CHF.   Electronically Signed   By: David  [place]   On: 05/13/2013 11:12    Assessment / Plan: 1.  Acute cholecystitis  WBC improved  Zosyn IV  Perc drain in place - management per IR 2.  Constipation  Laxatives and enemas as per Dr. Jacinto Halim orders  Velora Heckler, MD, [facility] Office: [phone]  05/15/2013

## 2013-05-15 NOTE — Progress Notes (Signed)
[facility] PROGRESS NOTE  Mike Mack WUJ:[id number] DOB: [birth year] DOA: 05/13/2013 PCP: Michiel Sites, MD  Brief narrative: 77 year old male with past medical history of DM, small bowel obstruction, BPH, recent enterococcal UTI who presented to [facility] ED 05/13/2013 with ongoing RUQ abdominal pain, N/V, constipation and generalized weakness for past few days prior to this admission. In ED, patient was noted to be febrile with Tmax of 101.29F. CBC revealed WBC count of 18.9, hemoglobin of 10.6 and normal platelet count. BMP revealed sodium of 132 and creatinine of 1.45. Lipase level was 9 on this admission. Normal LFT's. Acute abdominal series were significant for constipation. Abd US revealed distended gallbladder but no pericholecystic fluid evident. CT abdomen revealed a large amount of stool present within the colon consistent with severe constipation; distention of the gallbladder with mild gallbladder wall thickening which may reflect development of acalculus cholecystitis; possible early pneumonia in the lower lobes posteriorly. Patient had biliary drain placed by IR with no subsequent complications. He was started on broad spectrum antibiotics on the admission.   Assessment/Plan:   Principal Problem:  Acute cholecystitis  - status post percutaneous biliary drain placement 05/13/2013 by IR  - continue zosyn - blood cultures and CBD fluid cultures negative to date  - appreciated IR and surgery following   Active Problems:  Severe constipation, abdominal distention  - colace BID and miralax TID, enema PRN  - had BM yesterday Diabetes mellitus without complication, uncontrolled  - A1c 7.8 on this admission  - pt is on Levemir 50 units BID at home and metformin  - in the setting of an acute infection and renal failure continue to hold metformin  - restart insulin as po intake improves  - for now sliding scale only  Hypertension  - continue norvasc 5 mg daily, avapro 300 mg  at bedtime  BPH (benign prostatic hyperplasia)  - continue Flomax 0.4 mg daily  Enterococcus UTI  - pt on zosyn, has received vanco until 05/14/2013  Leukocytosis  - due to acute cholecystitis and/or UTI  - on zosyn  - WBC count trending down Acute renal failure  - likely prerenal, dehydration or metformin, vancomycin - vanco and metformin are all discontinued - creatinine trending up; continue IV fluids for next 12-24 hours  Anemia of chronic disease  - related to history of diabetes  - hemoglobin 9.7 - no indications for transfusion  H/O CVA  - on aspirin for secondary stroke prevention   Code Status: full code  Family Communication: no family at the bedside  Disposition Plan: to SNF once stable    Consultants:  Surgery  Interventional radiology Procedures:  Percutaneous cholecystostomy 05/13/2013 by IR Antibiotics:  Vancomycin 05/13/2013 -->  Zosyn 05/13/2013 -->   Manson Passey, MD  [facility] Pager [phone]  If 7PM-7AM, please contact night-coverage [email or web address] Password [facility] 05/15/2013, 7:42 AM   LOS: 2 days    HPI/Subjective: No overnight events.  Objective: Filed Vitals:   05/14/13 0458 05/14/13 1400 05/14/13 2147 05/15/13 0528  BP: 155/71 143/68 128/75 122/67  Pulse: 103 95 90 95  Temp: 99.7 F (37.6 C) 98.6 F (37 C) 98.4 F (36.9 C) 98.4 F (36.9 C)  TempSrc: Oral Oral Oral Oral  Resp: 15 16 16 16   Height:      Weight:    74.2 kg (163 lb 9.3 oz)  SpO2: 94% 94% 93% 94%    Intake/Output Summary (Last 24 hours) at 05/15/13 0742 Last data filed at 05/14/13 1500  Gross per 24 hour  Intake      0 ml  Output    203 ml  Net   -203 ml    Exam:   General:  Pt is sleeping, in no acute distress  Cardiovascular: Regular rate and rhythm, S1/S2, no murmurs, no rubs, no gallops  Respiratory: Clear to auscultation bilaterally, no wheezing, no crackles, no rhonchi  Abdomen: perc drain in place with bloody drainage, distended  abdomen  Extremities: No edema, pulses DP and PT palpable bilaterally  Neuro: Grossly nonfocal  Data Reviewed: Basic Metabolic Panel:  Recent Labs Lab 05/10/13 0055 05/13/13 0935 05/14/13 0340  NA 134* 132* 132*  K 4.7 4.2 4.6  CL 98 95* 98  CO2 26 26 22   GLUCOSE 186* 62* 155*  BUN 17 22 30*  CREATININE 1.09 1.45* 1.61*  CALCIUM 9.3 9.5 8.5  MG  --  2.6*  --    Liver Function Tests:  Recent Labs Lab 05/13/13 0935 05/14/13 0340  AST 26 48*  ALT 16 32  ALKPHOS 87 86  BILITOT 0.9 1.2  PROT 7.5 6.6  ALBUMIN 3.3* 2.7*    Recent Labs Lab 05/13/13 0935  LIPASE 9*   No results found for this basename: AMMONIA,  in the last 168 hours CBC:  Recent Labs Lab 05/10/13 0055 05/13/13 0935 05/14/13 0340  WBC 7.6 18.9* 18.0*  NEUTROABS 5.4 16.8*  --   HGB 11.5* 10.6* 9.7*  HCT 35.3* 31.9* 29.0*  MCV 91.2 89.4 88.7  PLT 247 236 226   Cardiac Enzymes: No results found for this basename: CKTOTAL, CKMB, CKMBINDEX, TROPONINI,  in the last 168 hours BNP: No components found with this basename: POCBNP,  CBG:  Recent Labs Lab 05/14/13 1201 05/14/13 1313 05/14/13 1745 05/14/13 2150 05/15/13 0733  GLUCAP 435* 298* 286* 157* 183*    Recent Results (from the past 240 hour(s))  URINE CULTURE     Status: None   Collection Time    05/10/13  1:27 AM      Result Value Range Status   Specimen Description URINE, RANDOM   Final   Special Requests NONE   Final   Culture  Setup Time     Final   Value: 05/10/2013 14:29     Performed at [facility] Count     Final   Value: >=100,000 COLONIES/ML     Performed at [facility]   Culture     Final   Value: ENTEROCOCCUS SPECIES     Performed at [facility]   Report Status 05/12/2013 FINAL   Final   Organism ID, Bacteria ENTEROCOCCUS SPECIES   Final  CULTURE, ROUTINE-ABSCESS     Status: None   Collection Time    05/13/13  5:47 PM      Result Value Range Status   Specimen Description  OTHER   Final   Special Requests NONE   Final   Gram Stain     Final   Value: NO WBC SEEN     NO SQUAMOUS EPITHELIAL CELLS SEEN     NO ORGANISMS SEEN     Performed at [facility]   Culture     Final   Value: RARE STAPHYLOCOCCUS AUREUS     Note: RIFAMPIN AND GENTAMICIN SHOULD NOT BE USED AS SINGLE DRUGS FOR TREATMENT OF STAPH INFECTIONS.     Performed at [facility]   Report Status PENDING   Incomplete  CULTURE, BLOOD (ROUTINE  X 2)     Status: None   Collection Time    05/13/13  6:00 PM      Result Value Range Status   Specimen Description BLOOD LEFT HAND   Final   Special Requests BOTTLES DRAWN AEROBIC AND ANAEROBIC 10CC   Final   Culture  Setup Time     Final   Value: 05/13/2013 22:00     Performed at [facility]   Culture     Final   Value:        BLOOD CULTURE RECEIVED NO GROWTH TO DATE CULTURE WILL BE HELD FOR 5 DAYS BEFORE ISSUING A FINAL NEGATIVE REPORT     Performed at [facility]   Report Status PENDING   Incomplete  BODY FLUID CULTURE     Status: None   Collection Time    05/13/13  6:12 PM      Result Value Range Status   Specimen Description COMMON BILE DUCT   Final   Special Requests NONE   Final   Gram Stain     Final   Value: NO WBC SEEN     NO ORGANISMS SEEN     Performed at [facility]   Culture     Final   Value: NO GROWTH 1 DAY     Performed at [facility]   Report Status PENDING   Incomplete  CULTURE, BLOOD (ROUTINE X 2)     Status: None   Collection Time    05/13/13  6:15 PM      Result Value Range Status   Specimen Description BLOOD RIGHT ARM   Final   Special Requests BOTTLES DRAWN AEROBIC AND ANAEROBIC 10CC   Final   Culture  Setup Time     Final   Value: 05/13/2013 22:00     Performed at [facility]   Culture     Final   Value:        BLOOD CULTURE RECEIVED NO GROWTH TO DATE CULTURE WILL BE HELD FOR 5 DAYS BEFORE ISSUING A FINAL NEGATIVE REPORT     Performed at [facility]    Report Status PENDING   Incomplete     Studies: Ct Abdomen Pelvis W Contrast  05/13/2013   CLINICAL DATA:  Nausea and vomiting and abdominal distention and pain with fever. There is history of small bowel obstruction.  EXAM: CT ABDOMEN AND PELVIS WITH CONTRAST  TECHNIQUE: Multidetector CT imaging of the abdomen and pelvis was performed using the standard protocol following bolus administration of intravenous contrast.  CONTRAST:  OMNIPAQUE IOHEXOL 300 MG/ML SOLN intravenously. The patient also received oral contrast material.  COMPARISON:  Acute abdominal series dated May 13, 2013 and CT scan of the abdomen and pelvis dated 10 May 2013  FINDINGS: The gallbladder is quite distended today. Mild gallbladder wall thickening is visible though there is no pericholecystic fluid. No stones are evident. The liver exhibits no focal mass nor ductal dilation. The pancreas, spleen, and partially distended stomach appear normal. The adrenal glands are normal in density and contour. The kidneys exhibit no evidence of obstruction. There is a nonobstructing 2 mm diameter lower pole stone on the right. There are numerous cortical hypodensities compatible with cysts. The perinephric fat is normal in appearance. The urinary bladder is partially distended and grossly normal. The enlarged prostate gland produces a prominent impression upon the urinary bladder base. There is no inguinal nor umbilical hernia. The psoas musculature is  normal in appearance. The caliber of the abdominal aorta is normal. There is no periaortic or pericaval lymphadenopathy.  There is a large amount of stool in the ascending and transverse and proximal descending portions of the colon. This is similar in appearance to that demonstrated on the earlier study of 22 November. The mid and distal descending colon exhibit a moderate amount of stool but are not quite as distended as the more proximal portions of the colon. No bowel wall  thickening is demonstrated to suggest colitis. There is no evidence of an annular constricting lesion. There are now all loops of mildly distended distal small bowel in the lower abdomen and upper pelvis. The appendix is again demonstrated and contains an appendicolith, it but does not appear edematous nor is there surrounding inflammatory change. The appendix is demonstrated best on the coronal reconstructed images, but in the axial plane it is demonstrated on images 57 through 66 of series 2.  Atelectasis versus early pneumonia in both lower lobes has developed since the previous study. There are small bilateral pleural effusions as well. Again demonstrated is chronic atrophy of the right gluteal musculature. The lumbar vertebral bodies are preserved in height. The intervertebral disc space heights appear reasonably well maintained.  IMPRESSION: 1. A large amount of stool is present within the colon as described. This pattern is similar to that seen on the CT scan of May 10, 2013 and is consistent with severe constipation. No annular constricting lesion or other obstructive process is demonstrated. There are no bowel wall changes to suggest colitis or diverticulitis. Distention of distal small bowel loops has developed since the earlier study. 2. Distention of the gallbladder is more prominent today and there is mild gallbladder wall thickening which may reflect development of acalculus cholecystitis. 3. There is new bibasilar atelectasis versus early pneumonia in the lower lobes posteriorly. Small bilateral pleural effusions have developed as well. 4. There is no evidence of urinary tract obstruction or acute inflammation. Renal cystic lesions are present bilaterally. Prostatic enlargement is again demonstrated.   Electronically Signed   By: David  [place]   On: 05/13/2013 11:43   US Abdomen Limited  05/13/2013   CLINICAL DATA:  Right upper quadrant abdominal discomfort and fever, distended gallbladder on  recent CT scan  EXAM: LIMITED ABDOMEN ULTRASOUND FOR ASCITES  COMPARISON:  CT scan of the abdomen and pelvis of today's date.  FINDINGS: Gallbladder  The gallbladder is moderately distended. There are echogenic stones as well as echogenic bile present. The stones appear mobile. The gallbladder wall is mildly thickened at 4.7 mm. There is no pericholecystic fluid nor positive sonographic Murphy's sign.  Common bile duct  Diameter: 3.3 mm.  Liver  No focal lesion identified. Within normal limits in parenchymal echogenicity.  IVC  No abnormality visualized.  Pancreas  Bowel gas obscures the pancreas.  Spleen  Size and appearance within normal limits.  Right Kidney  Length: 9 cm. Echogenicity within normal limits. Multiple cysts are present with the largest lying in the midpole laterally measuring 1.8 x 2 x 1.7 cm.  Left Kidney  Length: 9.8 cm. Echogenicity within normal limits. A lower pole cyst on the left measures 2.3 x 2.1 x 1.9 cm.  Abdominal aorta  The abdominal aorta is obscured by bowel gas.  IMPRESSION: 1. The gallbladder is distended and exhibits wall thickening with multiple tiny stones as well as echogenic bile. There is no positive sonographic Murphy's sign or definite pericholecystic fluid. 2. No acute abnormality of  the liver or common bile duct is demonstrated. The pancreas is obscured by bowel gas. 3. There are simple appearing cysts in both kidneys. There is no hydronephrosis.   Electronically Signed   By: David  [place]   On: 05/13/2013 12:54   Ir Perc Cholecystostomy  05/13/2013   CLINICAL DATA:  77 year old with acute cholecystitis. The patient is not a good surgical candidate right now due to abdominal distention from fecal impaction.  EXAM: PERCUTANEOUS CHOLECYSTOSTOMY TUBE PLACEMENT WITH ULTRASOUND AND FLUOROSCOPIC GUIDANCE  Physician: Rachelle Hora. Lowella Dandy, MD  MEDICATIONS: Fentanyl 25 mcg IV. A radiology nurse monitored the patient throughout the procedure.  FLUOROSCOPY TIME:  12 seconds  PROCEDURE:  Informed consent was obtained for percutaneous cholecystostomy tube placement. Patient was placed supine on the interventional table. Ultrasound demonstrated a dilated gallbladder. The right side of the abdomen was prepped and draped in sterile fashion. Maximal barrier sterile technique was utilized including caps, mask, sterile gowns, sterile gloves, sterile drape, hand hygiene and skin antiseptic. The right side of the abdomen was anesthetized with 1% lidocaine. Using ultrasound guidance, a 22 gauge needle was directed into the distended gallbladder from a transhepatic approach. A 0.018 wire was advanced into the gallbladder. The tract was dilated to accommodate a 10 [place] multipurpose drain. Dark black fluid was aspirated from the gallbladder. Catheter was sutured to the skin and attached to a gravity bag. A sample of the bile was sent for Gram stain and culture. Fluoroscopic and ultrasound images were taken and saved for documentation.  COMPLICATIONS: None  FINDINGS: Distended gallbladder.  Successful placement of gallbladder drain.  IMPRESSION: Successful cholecystostomy tube placement with ultrasound and fluoroscopic guidance. Sample of the fluid was sent for Gram stain and culture.   Electronically Signed   By: Richarda Overlie M.D.   On: 05/13/2013 18:05   Dg Abd Acute W/chest  05/13/2013   CLINICAL DATA:  Patient is complaining of abdominal pain and distension and has a history of small bowel obstruction.  EXAM: ACUTE ABDOMEN SERIES (ABDOMEN 2 VIEW & CHEST 1 VIEW)  COMPARISON:  CT scan of the abdomen and pelvis dated May 10, 2013.  FINDINGS: The lungs are mildly hypoinflated. The interstitial markings are mildly increased but this is in part due to vascular crowding. There is a trace of blunting of the left lateral costophrenic angle. The cardiac silhouette is mildly enlarged and the central pulmonary vascularity is prominent. Within the abdomen there is a large amount of stool throughout the ascending  and transverse and descending portions of the colon. Proximal to this there are a few loops of mildly distended gas-filled small bowel. These lie principally within the pelvis. There is no more than a small amount of gas in the rectum. No free extraluminal gas collections are demonstrated. No acute bony abnormality is demonstrated. There is chronic rotatory scoliosis of the lumbar spine.  IMPRESSION: 1. The bowel gas pattern is consistent with constipation. Minimal distention of small bowel loops with gas is demonstrated as well. 2. The chest film reveals a small left pleural effusion and mild prominence of the pulmonary interstitium and pulmonary vascularity suggesting low-grade CHF.   Electronically Signed   By: David  [place]   On: 05/13/2013 11:12    Scheduled Meds: . amLODipine  5 mg Oral q morning - 10a  . aspirin  325 mg Oral Daily  . darifenacin  15 mg Oral Daily  . docusate sodium  100 mg Oral BID  . insulin aspart  0-20 Units Subcutaneous  TID WC  . insulin aspart  0-5 Units Subcutaneous QHS  . irbesartan  300 mg Oral QHS  . latanoprost  1 drop Both Eyes QHS  . pantoprazole (PROTONIX) IV  40 mg Intravenous Q24H  . piperacillin-tazobactam (ZOSYN)  IV  3.375 g Intravenous Q8H  . predniSONE  6 mg Oral Q breakfast  . tamsulosin  0.4 mg Oral BID   Continuous Infusions: . sodium chloride 75 mL/hr at 05/15/13 0408

## 2013-05-16 ENCOUNTER — Inpatient Hospital Stay (HOSPITAL_COMMUNITY): Payer: Medicare Other

## 2013-05-16 DIAGNOSIS — E119 Type 2 diabetes mellitus without complications: Secondary | ICD-10-CM

## 2013-05-16 LAB — CULTURE, ROUTINE-ABSCESS: Gram Stain: NONE SEEN

## 2013-05-16 LAB — BASIC METABOLIC PANEL
BUN: 33 mg/dL — ABNORMAL HIGH (ref 6–23)
Chloride: 97 mEq/L (ref 96–112)
Creatinine, Ser: 1.59 mg/dL — ABNORMAL HIGH (ref 0.50–1.35)
GFR calc Af Amer: 48 mL/min — ABNORMAL LOW (ref >90)
GFR calc non Af Amer: 41 mL/min — ABNORMAL LOW (ref >90)
Glucose, Bld: 158 mg/dL — ABNORMAL HIGH (ref 70–99)
Sodium: 132 mEq/L — ABNORMAL LOW (ref 135–145)

## 2013-05-16 LAB — CBC
HCT: 30.2 % — ABNORMAL LOW (ref 39.0–52.0)
Hemoglobin: 10.4 g/dL — ABNORMAL LOW (ref 13.0–17.0)
MCH: 30.1 pg (ref 26.0–34.0)
MCHC: 34.4 g/dL (ref 30.0–36.0)
MCV: 87.3 fL (ref 78.0–100.0)
RBC: 3.46 MIL/uL — ABNORMAL LOW (ref 4.22–5.81)
WBC: 10.6 10*3/uL — ABNORMAL HIGH (ref 4.0–10.5)

## 2013-05-16 LAB — GLUCOSE, CAPILLARY: Glucose-Capillary: 74 mg/dL (ref 70–99)

## 2013-05-16 LAB — CREATININE, URINE, RANDOM: Creatinine, Urine: 109.6 mg/dL

## 2013-05-16 MED ORDER — INSULIN DETEMIR 100 UNIT/ML ~~LOC~~ SOLN
25.0000 [IU] | Freq: Two times a day (BID) | SUBCUTANEOUS | Status: DC
  Administered 2013-05-16 – 2013-05-19 (×5): 25 [IU] via SUBCUTANEOUS
  Filled 2013-05-16 (×8): qty 0.25

## 2013-05-16 MED ORDER — NITROFURANTOIN MONOHYD MACRO 100 MG PO CAPS: 100.0000 mg | ORAL_CAPSULE | Freq: Two times a day (BID) | ORAL | Status: DC

## 2013-05-16 MED ORDER — POLYETHYLENE GLYCOL 3350 17 G PO PACK
17.0000 g | PACK | Freq: Every day | ORAL | Status: DC
  Administered 2013-05-16: 17 g via ORAL
  Filled 2013-05-16 (×2): qty 1

## 2013-05-16 MED ORDER — VANCOMYCIN HCL IN DEXTROSE 1-5 GM/200ML-% IV SOLN
1000.0000 mg | INTRAVENOUS | Status: DC
  Administered 2013-05-16 – 2013-05-19 (×4): 1000 mg via INTRAVENOUS
  Filled 2013-05-16 (×4): qty 200

## 2013-05-16 MED ORDER — POLYETHYLENE GLYCOL 3350 17 G PO PACK
17.0000 g | PACK | Freq: Three times a day (TID) | ORAL | Status: DC
  Administered 2013-05-16 – 2013-05-19 (×11): 17 g via ORAL
  Filled 2013-05-16 (×16): qty 1

## 2013-05-16 MED ORDER — FLEET ENEMA 7-19 GM/118ML RE ENEM: 1.0000 | ENEMA | Freq: Once | RECTAL | Status: DC

## 2013-05-16 MED ORDER — SORBITOL 70 % SOLN
960.0000 mL | TOPICAL_OIL | Freq: Once | ORAL | Status: AC
  Administered 2013-05-16: 960 mL via RECTAL
  Filled 2013-05-16: qty 240

## 2013-05-16 NOTE — Progress Notes (Signed)
Physical Therapy Treatment Patient Details Name: Mike Mack MRN: [id number] DOB: 31-May-1936 Today's Date: 05/16/2013 Time: 4098-1191 PT Time Calculation (min): 23 min  PT Assessment / Plan / Recommendation  History of Present Illness With history of hypertension, diabetes mellitus, prior history of small bowel obstruction, BPH, history of stroke presented to the ED with 3 to four-day history of right upper quadrant pain, abdominal distention, chronic constipation, generalized weakness, nausea vomiting. Patient was seen in the ED and noted to be febrile on a 1.3. Patient also with complaints of right flank pain. Pt is now s/p percutaneous cholecystostomy/ Pt has h/o polio affecting R LE.   PT Comments   Pain limiting effort today although he was still willing to work with PT; Requiring much more assistance today and with continued c/o abd pain and distention; RN aware;  Follow Up Recommendations  SNF     Does the patient have the potential to tolerate intense rehabilitation     Barriers to Discharge        Equipment Recommendations  None recommended by PT    Recommendations for Other Services    Frequency Min 3X/week   Progress towards PT Goals Progress towards PT goals: Not progressing toward goals - comment (requiring incr assist today)  Plan Current plan remains appropriate    Precautions / Restrictions Precautions Precautions: Fall Precaution Comments: post polio syndrome affecting RLE Other Brace/Splint: pt states he has a R foot/leg brace but not at the hospital currently.   Pertinent Vitals/Pain abd pain    Mobility  Bed Mobility Bed Mobility: Left Sidelying to Sit;Sit to Sidelying Left Left Sidelying to Sit: 2: Max assist Sit to Sidelying Left: 3: Mod assist;With rail Details for Bed Mobility Assistance: requires incr assist today for trunk and LEs; incr pain limiting pt effort  Transfers Transfers: Sit to Stand;Stand to Sit (times 2) Sit to Stand: 3: Mod  assist;From bed;With upper extremity assist Stand to Sit: 3: Mod assist;To bed;With upper extremity assist Details for Transfer Assistance: verbal cues for hand placement. assist to rise and steady and then control descent. Ambulation/Gait Ambulation/Gait Assistance:  (unable to due to pain) General Gait Details: attempted side/lateral steps along EOB; pt requiring max assist for wt shift and balance    Exercises     PT Diagnosis:    PT Problem List:   PT Treatment Interventions:     PT Goals (current goals can now be found in the care plan section) Acute Rehab PT Goals Patient Stated Goal: get stronger Time For Goal Achievement: 05/28/13 Potential to Achieve Goals: Good  Visit Information  Last PT Received On: 05/16/13 Assistance Needed: +2 (requiring incr assist today; +2 if amb) History of Present Illness: With history of hypertension, diabetes mellitus, prior history of small bowel obstruction, BPH, history of stroke presented to the ED with 3 to four-day history of right upper quadrant pain, abdominal distention, chronic constipation, generalized weakness, nausea vomiting. Patient was seen in the ED and noted to be febrile on a 1.3. Patient also with complaints of right flank pain. Pt is now s/p percutaneous cholecystostomy/ Pt has h/o polio affecting R LE.    Subjective Data  Subjective: I will but it is not a good day Patient Stated Goal: get stronger   Cognition  Cognition Arousal/Alertness: Awake/alert Behavior During Therapy: WFL for tasks assessed/performed Overall Cognitive Status: Within Functional Limits for tasks assessed    Balance  Static Sitting Balance Static Sitting - Level of Assistance: 5: Stand by assistance Static  Standing Balance Static Standing - Balance Support: Bilateral upper extremity supported Static Standing - Level of Assistance: 4: Min assist;3: Mod assist Static Standing - Comment/# of Minutes: requirng mod assist to prevent posterior LOB   End of Session PT - End of Session Equipment Utilized During Treatment: Gait belt Activity Tolerance: Patient tolerated treatment well Patient left: in bed;with call bell/phone within reach;with family/visitor present Nurse Communication: Mobility status   GP     [facility] 05/16/2013, 2:08 PM

## 2013-05-16 NOTE — Progress Notes (Signed)
Pt last cbg checked was 74. MD on call notified. He said ok to hold pt levemir for tonite. Will continue to monitor cbg closely.

## 2013-05-16 NOTE — Progress Notes (Addendum)
[facility] PROGRESS NOTE  Mike Mack WUJ:[id number] DOB: 1935-11-03 DOA: 05/13/2013 PCP: Michiel Sites, MD  Brief narrative: 77 year old male with past medical history of DM, small bowel obstruction, BPH, recent enterococcal UTI who presented to [facility] ED 05/13/2013 with ongoing RUQ abdominal pain, N/V, constipation and generalized weakness for past few days prior to this admission. In ED, patient was noted to be febrile with Tmax of 101.31F. CBC revealed WBC count of 18.9, hemoglobin of 10.6 and normal platelet count. BMP revealed sodium of 132 and creatinine of 1.45. Lipase level was 9 on this admission. Normal LFT's. Acute abdominal series were significant for constipation. Abd US revealed distended gallbladder but no pericholecystic fluid evident. CT abdomen revealed a large amount of stool present within the colon consistent with severe constipation; distention of the gallbladder with mild gallbladder wall thickening which may reflect development of acalculus cholecystitis; possible early pneumonia in the lower lobes posteriorly. Patient had biliary drain placed by IR with no subsequent complications. He was started on broad spectrum antibiotics on the admission.  Hospital course complicated with ongoing abdominal distention and constipation which has improved minimally with extensive bowel regimen including stool softener, fiber supplementation, enemas.  Assessment/Plan:   Principal Problem:  Acute cholecystitis  - status post percutaneous biliary drain placement 05/13/2013 by IR  - continue zosyn for now - biliary fluid culture - final result grew MRSA so will add vanco to antibiotic regimen - blood cultures negative to date  - appreciated IR and surgery following  Active Problems:  Severe constipation, abdominal distention  - colace BID and miralax increase to 4 times a day, enema PRN as frequently as needed  - had BM 2 days ago - abdominal x ray today Diabetes mellitus  without complication, uncontrolled  - A1c 7.8 on this admission  - pt is on Levemir 50 units BID at home and metformin  - in the setting of an acute infection and renal failure continue to hold metformin  - order Levemir but half the dosage of home dose; so 25 units BID instead of 50 units BID  - CBG's in past 24 hours: 180, 166, 151 Hypertension  - continue norvasc 5 mg daily, avapro 300 mg at bedtime  BPH (benign prostatic hyperplasia)  - continue Flomax 0.4 mg daily  Enterococcus UTI  - pt on zosyn but this is not going to cover for enterococcal UTI, has received vanco until 05/14/2013;  - urine culture grew enterococcus on 05/10/2013; with the addition of vanco for MRSA pos fluid cultures in biliary drain this will cover for enterococcus UTI Leukocytosis  - due to acute cholecystitis and/or enterococcal UTI  - on zosyn  - WBC count trending down from 18 to 12 Acute renal failure  - likely prerenal, dehydration or metformin, vancomycin  - vanco and metformin are all discontinued  - creatinine as high as 1.71 but now slowly trending down Anemia of chronic disease  - related to history of diabetes  - hemoglobin 9.7  - no indications for transfusion  H/O CVA  - on aspirin for secondary stroke prevention   Code Status: full code  Family Communication: no family at the bedside  Disposition Plan: to SNF once stable   Consultants:  Surgery  Interventional radiology Procedures:  Percutaneous cholecystostomy 05/13/2013 by IR Antibiotics:  Vancomycin 05/13/2013 --> 05/14/2013; restarted 05/16/2013 --> Zosyn 05/13/2013 -->   Manson Passey, MD  [facility] Pager [phone]  If 7PM-7AM, please contact night-coverage [email or web address] Password [facility] 05/16/2013,  6:18 AM   LOS: 3 days    HPI/Subjective: No overnight events.  Objective: Filed Vitals:   05/14/13 2147 05/15/13 0528 05/15/13 1400 05/15/13 2221  BP: 128/75 122/67 120/70 158/81  Pulse: 90 95 92 102  Temp:  98.4 F (36.9 C) 98.4 F (36.9 C) 98.6 F (37 C) 98.3 F (36.8 C)  TempSrc: Oral Oral Oral Oral  Resp: 16 16 16 16   Height:      Weight:  74.2 kg (163 lb 9.3 oz)    SpO2: 93% 94% 94% 94%    Intake/Output Summary (Last 24 hours) at 05/16/13 0618 Last data filed at 05/15/13 2222  Gross per 24 hour  Intake    120 ml  Output    550 ml  Net   -430 ml    Exam:   General:  Pt is alert, follows commands appropriately, not in acute distress  Cardiovascular: Regular rate and rhythm, S1/S2, no murmurs, no rubs, no gallops  Respiratory: Clear to auscultation bilaterally, no wheezing, no crackles, no rhonchi  Abdomen: abd distended, perc drain in place  Extremities: No edema, pulses DP and PT palpable bilaterally  Neuro: Grossly nonfocal  Data Reviewed: Basic Metabolic Panel:  Recent Labs Lab 05/10/13 0055 05/13/13 0935 05/14/13 0340 05/15/13 0755  NA 134* 132* 132* 127*  K 4.7 4.2 4.6 4.4  CL 98 95* 98 94*  CO2 26 26 22 22   GLUCOSE 186* 62* 155* 182*  BUN 17 22 30* 38*  CREATININE 1.09 1.45* 1.61* 1.71*  CALCIUM 9.3 9.5 8.5 8.4  MG  --  2.6*  --   --    Liver Function Tests:  Recent Labs Lab 05/13/13 0935 05/14/13 0340 05/15/13 0755  AST 26 48* 46*  ALT 16 32 34  ALKPHOS 87 86 84  BILITOT 0.9 1.2 0.8  PROT 7.5 6.6 6.6  ALBUMIN 3.3* 2.7* 2.5*    Recent Labs Lab 05/13/13 0935  LIPASE 9*   No results found for this basename: AMMONIA,  in the last 168 hours CBC:  Recent Labs Lab 05/10/13 0055 05/13/13 0935 05/14/13 0340 05/15/13 0755  WBC 7.6 18.9* 18.0* 12.1*  NEUTROABS 5.4 16.8*  --   --   HGB 11.5* 10.6* 9.7* 9.7*  HCT 35.3* 31.9* 29.0* 28.1*  MCV 91.2 89.4 88.7 87.3  PLT 247 236 226 264   Cardiac Enzymes: No results found for this basename: CKTOTAL, CKMB, CKMBINDEX, TROPONINI,  in the last 168 hours BNP: No components found with this basename: POCBNP,  CBG:  Recent Labs Lab 05/14/13 2150 05/15/13 0733 05/15/13 1507 05/15/13 1635  05/15/13 2218  GLUCAP 157* 183* 204* 180* 166*    Recent Results (from the past 240 hour(s))  URINE CULTURE     Status: None   Collection Time    05/10/13  1:27 AM      Result Value Range Status   Specimen Description URINE, RANDOM   Final   Special Requests NONE   Final   Culture  Setup Time     Final   Value: 05/10/2013 14:29     Performed at [facility] Count     Final   Value: >=100,000 COLONIES/ML     Performed at [facility]   Culture     Final   Value: ENTEROCOCCUS SPECIES     Performed at [facility]   Report Status 05/12/2013 FINAL   Final   Organism ID, Bacteria ENTEROCOCCUS SPECIES   Final  CULTURE, ROUTINE-ABSCESS     Status: None   Collection Time    05/13/13  5:47 PM      Result Value Range Status   Specimen Description OTHER   Final   Special Requests NONE   Final   Gram Stain     Final   Value: NO WBC SEEN     NO SQUAMOUS EPITHELIAL CELLS SEEN     NO ORGANISMS SEEN     Performed at [facility]   Culture     Final   Value: RARE STAPHYLOCOCCUS AUREUS     Note: RIFAMPIN AND GENTAMICIN SHOULD NOT BE USED AS SINGLE DRUGS FOR TREATMENT OF STAPH INFECTIONS.     Performed at [facility]   Report Status PENDING   Incomplete  CULTURE, BLOOD (ROUTINE X 2)     Status: None   Collection Time    05/13/13  6:00 PM      Result Value Range Status   Specimen Description BLOOD LEFT HAND   Final   Special Requests BOTTLES DRAWN AEROBIC AND ANAEROBIC 10CC   Final   Culture  Setup Time     Final   Value: 05/13/2013 22:00     Performed at [facility]   Culture     Final   Value:        BLOOD CULTURE RECEIVED NO GROWTH TO DATE CULTURE WILL BE HELD FOR 5 DAYS BEFORE ISSUING A FINAL NEGATIVE REPORT     Performed at [facility]   Report Status PENDING   Incomplete  BODY FLUID CULTURE     Status: None   Collection Time    05/13/13  6:12 PM      Result Value Range Status   Specimen Description COMMON  BILE DUCT   Final   Special Requests NONE   Final   Gram Stain     Final   Value: NO WBC SEEN     NO ORGANISMS SEEN     Performed at [facility]   Culture     Final   Value: NO GROWTH 2 DAYS     Performed at [facility]   Report Status PENDING   Incomplete  CULTURE, BLOOD (ROUTINE X 2)     Status: None   Collection Time    05/13/13  6:15 PM      Result Value Range Status   Specimen Description BLOOD RIGHT ARM   Final   Special Requests BOTTLES DRAWN AEROBIC AND ANAEROBIC 10CC   Final   Culture  Setup Time     Final   Value: 05/13/2013 22:00     Performed at [facility]   Culture     Final   Value:        BLOOD CULTURE RECEIVED NO GROWTH TO DATE CULTURE WILL BE HELD FOR 5 DAYS BEFORE ISSUING A FINAL NEGATIVE REPORT     Performed at [facility]   Report Status PENDING   Incomplete     Studies: No results found.  Scheduled Meds: . amLODipine  5 mg Oral q morning - 10a  . aspirin  325 mg Oral Daily  . darifenacin  15 mg Oral Daily  . docusate sodium  100 mg Oral BID  . insulin aspart  0-20 Units Subcutaneous TID WC  . insulin aspart  0-5 Units Subcutaneous QHS  . irbesartan  300 mg Oral QHS  . latanoprost  1 drop Both Eyes QHS  .  pantoprazole   40 mg Intravenous Q24H  . piperacillin-tazobactam  3.375 g Intravenous Q8H  . predniSONE  6 mg Oral Q breakfast  . tamsulosin  0.4 mg Oral BID   Continuous Infusions: . sodium chloride 75 mL/hr at 05/16/13 0429

## 2013-05-16 NOTE — Progress Notes (Addendum)
ANTIBIOTIC CONSULT NOTE - INITIAL  Pharmacy Consult for Vancomycin Indication: MRSA in bile drainage  No Known Allergies  Patient Measurements: Height: 5' (152.4 cm) Weight: 161 lb 6 oz (73.2 kg) IBW/kg (Calculated) : 50  Vital Signs: Temp: 99 F (37.2 C) (11/28 0555) Temp src: Oral (11/28 0555) BP: 131/71 mmHg (11/28 0555) Pulse Rate: 89 (11/28 0555) Intake/Output from previous day: 11/27 0701 - 11/28 0700 In: 1920 [P.O.:120; I.V.:1800] Out: 550 [Urine:550] Intake/Output from this shift:    Labs:  Recent Labs  05/14/13 0340 05/15/13 0755 05/16/13 0558 05/16/13 0930  WBC 18.0* 12.1*  --   --   HGB 9.7* 9.7*  --   --   PLT 226 264  --   --   LABCREA  --   --  109.6  --   CREATININE 1.61* 1.71*  --  1.59*   Estimated Creatinine Clearance: 34.2 ml/min (by C-G formula based on Cr of 1.59). No results found for this basename: VANCOTROUGH, Leodis Binet, VANCORANDOM, GENTTROUGH, GENTPEAK, GENTRANDOM, TOBRATROUGH, TOBRAPEAK, TOBRARND, AMIKACINPEAK, AMIKACINTROU, AMIKACIN,  in the last 72 hours   Microbiology: Recent Results (from the past 720 hour(s))  URINE CULTURE     Status: None   Collection Time    05/10/13  1:27 AM      Result Value Range Status   Specimen Description URINE, RANDOM   Final   Special Requests NONE   Final   Culture  Setup Time     Final   Value: 05/10/2013 14:29     Performed at [facility] Count     Final   Value: >=100,000 COLONIES/ML     Performed at [facility]   Culture     Final   Value: ENTEROCOCCUS SPECIES     Performed at [facility]   Report Status 05/12/2013 FINAL   Final   Organism ID, Bacteria ENTEROCOCCUS SPECIES   Final  CULTURE, ROUTINE-ABSCESS     Status: None   Collection Time    05/13/13  5:47 PM      Result Value Range Status   Specimen Description OTHER   Final   Special Requests NONE   Final   Gram Stain     Final   Value: NO WBC SEEN     NO SQUAMOUS EPITHELIAL CELLS SEEN     NO  ORGANISMS SEEN     Performed at [facility]   Culture     Final   Value: RARE METHICILLIN RESISTANT STAPHYLOCOCCUS AUREUS     Note: RIFAMPIN AND GENTAMICIN SHOULD NOT BE USED AS SINGLE DRUGS FOR TREATMENT OF STAPH INFECTIONS. CRITICAL RESULT CALLED TO, READ BACK BY AND VERIFIED WITH: MATTHEW WRIGHT 05/16/13 1045 BY SMITHERSJ     Performed at [facility]   Report Status 05/16/2013 FINAL   Final   Organism ID, Bacteria METHICILLIN RESISTANT STAPHYLOCOCCUS AUREUS   Final  CULTURE, BLOOD (ROUTINE X 2)     Status: None   Collection Time    05/13/13  6:00 PM      Result Value Range Status   Specimen Description BLOOD LEFT HAND   Final   Special Requests BOTTLES DRAWN AEROBIC AND ANAEROBIC 10CC   Final   Culture  Setup Time     Final   Value: 05/13/2013 22:00     Performed at [facility]   Culture     Final   Value:        BLOOD CULTURE  RECEIVED NO GROWTH TO DATE CULTURE WILL BE HELD FOR 5 DAYS BEFORE ISSUING A FINAL NEGATIVE REPORT     Performed at [facility]   Report Status PENDING   Incomplete  BODY FLUID CULTURE     Status: None   Collection Time    05/13/13  6:12 PM      Result Value Range Status   Specimen Description COMMON BILE DUCT   Final   Special Requests NONE   Final   Gram Stain     Final   Value: NO WBC SEEN     NO ORGANISMS SEEN     Performed at [facility]   Culture     Final   Value: NO GROWTH 3 DAYS     Performed at [facility]   Report Status PENDING   Incomplete  CULTURE, BLOOD (ROUTINE X 2)     Status: None   Collection Time    05/13/13  6:15 PM      Result Value Range Status   Specimen Description BLOOD RIGHT ARM   Final   Special Requests BOTTLES DRAWN AEROBIC AND ANAEROBIC 10CC   Final   Culture  Setup Time     Final   Value: 05/13/2013 22:00     Performed at [facility]   Culture     Final   Value:        BLOOD CULTURE RECEIVED NO GROWTH TO DATE CULTURE WILL BE HELD FOR 5 DAYS BEFORE  ISSUING A FINAL NEGATIVE REPORT     Performed at [facility]   Report Status PENDING   Incomplete    Medical History: Past Medical History  Diagnosis Date  . Diabetes mellitus without complication   . Hypertension   . Small bowel obstruction   . BPH (benign prostatic hyperplasia)   . Neuropathy   . Stroke   . Polio   . Polio Childhood  . Arthritis   . Constipation 05/13/2013  . Anemia 05/13/2013   Medications:  Anti-infectives   Start     Dose/Rate Route Frequency Ordered Stop   05/16/13 1200  vancomycin (VANCOCIN) IVPB 1000 mg/200 mL premix     1,000 mg 200 mL/hr over 60 Minutes Intravenous Every 24 hours 05/16/13 1125     05/13/13 2000  piperacillin-tazobactam (ZOSYN) IVPB 3.375 g     3.375 g 12.5 mL/hr over 240 Minutes Intravenous Every 8 hours 05/13/13 1603     05/13/13 1215  levofloxacin (LEVAQUIN) tablet 500 mg  Status:  Discontinued     500 mg Oral  Once 05/13/13 1213 05/13/13 1237   05/13/13 1200  piperacillin-tazobactam (ZOSYN) IVPB 3.375 g  Status:  Discontinued     3.375 g 12.5 mL/hr over 240 Minutes Intravenous  Once 05/13/13 1157 05/13/13 1158   05/13/13 1200  piperacillin-tazobactam (ZOSYN) IVPB 3.375 g     3.375 g 100 mL/hr over 30 Minutes Intravenous STAT 05/13/13 1159 05/13/13 1302   05/13/13 1000  vancomycin (VANCOCIN) IVPB 1000 mg/200 mL premix     1,000 mg 200 mL/hr over 60 Minutes Intravenous  Once 05/13/13 0937 05/13/13 1214     Assessment: 76 yoM hx of small bowel obstruction presented to the ED with 3 to four-day history of right upper quadrant pain, abdominal distention, chronic constipation, generalized weakness, nausea vomiting . PMH; HTN, DM, CVA, BPH, polio (child)  Day 4 of Zosyn for acute cholecystitis, drainage cx with MRSA  Begin Vancomycin per pharmacy  Goal of  Therapy:  Vancomycin trough level 15-20 mcg/ml  Plan:   Vancomycin 1gm q24 (dose based on renal function)  No change Zosyn 3.375gm q8hr  Monitor cultures,  renal function  Adjust dose, obtain trough if necessary  Otho Bellows PharmD Pager [phone] 05/16/2013, 11:33 AM

## 2013-05-16 NOTE — Progress Notes (Signed)
Patient ID: Mike Mack, male   DOB: Oct 28, 1935, 77 y.o.   MRN: [id number]  General Surgery - [facility] - Progress Note  Subjective: Patient complains of abdominal distension.  No significant pain.  Tolerating liquids.  No BM except with enema two days ago.  Wife at bedside.  Objective: Vital signs in last 24 hours: Temp:  [98.3 F (36.8 C)-99 F (37.2 C)] 99 F (37.2 C) (11/28 0555) Pulse Rate:  [89-102] 89 (11/28 0555) Resp:  [16] 16 (11/28 0555) BP: (120-158)/(70-81) 131/71 mmHg (11/28 0555) SpO2:  [93 %-94 %] 93 % (11/28 0555) Weight:  [161 lb 6 oz (73.2 kg)] 161 lb 6 oz (73.2 kg) (11/28 0555) Last BM Date: 05/14/13  Intake/Output from previous day: 11/27 0701 - 11/28 0700 In: 1920 [P.O.:120; I.V.:1800] Out: 550 [Urine:550]  Exam: HEENT - clear, not icteric Neck - soft Chest - clear bilaterally Cor - RRR, no murmur Abd - markedly distended; BS present; tympanitic, non-tender; drain in RUQ with thin reddish brown output Ext - no significant edema  Lab Results:   Recent Labs  05/14/13 0340 05/15/13 0755  WBC 18.0* 12.1*  HGB 9.7* 9.7*  HCT 29.0* 28.1*  PLT 226 264     Recent Labs  05/14/13 0340 05/15/13 0755  NA 132* 127*  K 4.6 4.4  CL 98 94*  CO2 22 22  GLUCOSE 155* 182*  BUN 30* 38*  CREATININE 1.61* 1.71*  CALCIUM 8.5 8.4    Studies/Results: No results found.  Assessment / Plan: 1.  Acute cholecystitis  Perc drain in place  IV Zosyn 2.  Abdominal distension  Felt to be constipation - given Miralax and enemas two days ago with minimal results  Check 2 view abdominal x-ray this AM - rule out SBO  Enemas today if AXR consistent with constipation Will follow  Velora Heckler, MD, [facility] Office: [phone]  05/16/2013

## 2013-05-16 NOTE — Progress Notes (Signed)
CRITICAL VALUE ALERT  Critical value received:  MRSA + Culture from Cholecystostomy Tube  Date of notification:  05/16/2013  Time of notification:  1050  Critical value read back:yes  Nurse who received alert:  Christean Leaf  MD notified (1st page):  Manson Passey  Time of first page:  1055  MD notified (2nd page):  Time of second page:  Responding MD:  Dr Elisabeth Pigeon   Time MD responded:  1100

## 2013-05-16 NOTE — Progress Notes (Signed)
CSW provided pt and pt wife with bed offers. Pt and pt wife interested in [address] and [address] and [address], where bed offer was givenPt wife also interested in [place]. CSW has not received information from [facility] regarding bed offer yet. CSW called [facility] however no answer. Pt wife plans to visit these facilities to make final decision.   Catha Gosselin, LCSW [phone]  ED CSW .05/16/2013 1033am

## 2013-05-16 NOTE — Progress Notes (Signed)
Patient received SMOG enema from Christean Leaf, RN.  I came on at 1500.  Patient attempted to have BM on bedpan without any results.  Assisted patient to the bedside commode.  He attempted for about 10 minutes.  Assisted patient back to bed.  He had no BM but he did have approximately 20 cc of bright red blood in the commode.  Dr. Elisabeth Pigeon notified and orders received.  Allayne Butcher [facility]  05/16/2013  4:09 PM

## 2013-05-16 NOTE — Progress Notes (Addendum)
Subjective: Pt still constipated; last BM 2 days ago; on liquid diet; denies N/V  Objective: Vital signs in last 24 hours: Temp:  [98.3 F (36.8 C)-99 F (37.2 C)] 99 F (37.2 C) (11/28 0555) Pulse Rate:  [89-102] 89 (11/28 0555) Resp:  [16] 16 (11/28 0555) BP: (120-158)/(70-81) 131/71 mmHg (11/28 0555) SpO2:  [93 %-94 %] 93 % (11/28 0555) Weight:  [161 lb 6 oz (73.2 kg)] 161 lb 6 oz (73.2 kg) (11/28 0555) Last BM Date: 05/14/13  Intake/Output from previous day: 11/27 0701 - 11/28 0700 In: 1920 [P.O.:120; I.V.:1800] Out: 550 [Urine:550] Intake/Output this shift:    GB drain intact, last recorded output 250 cc's bloody fluid, about 50 cc's present in bag at present; drain flushed with 5 cc's sterile NS; small clot in tubing; insertion site ok; cx's pend; abd markedly dist/taut, few BS  Lab Results:   Recent Labs  05/14/13 0340 05/15/13 0755  WBC 18.0* 12.1*  HGB 9.7* 9.7*  HCT 29.0* 28.1*  PLT 226 264   BMET  Recent Labs  05/14/13 0340 05/15/13 0755  NA 132* 127*  K 4.6 4.4  CL 98 94*  CO2 22 22  GLUCOSE 155* 182*  BUN 30* 38*  CREATININE 1.61* 1.71*  CALCIUM 8.5 8.4   PT/INR  Recent Labs  05/13/13 1431  LABPROT 16.5*  INR 1.37   ABG No results found for this basename: PHART, PCO2, PO2, HCO3,  in the last 72 hours  Studies/Results: No results found. Results for orders placed during the hospital encounter of 05/13/13  CULTURE, ROUTINE-ABSCESS     Status: None   Collection Time    05/13/13  5:47 PM      Result Value Range Status   Specimen Description OTHER   Final   Special Requests NONE   Final   Gram Stain     Final   Value: NO WBC SEEN     NO SQUAMOUS EPITHELIAL CELLS SEEN     NO ORGANISMS SEEN     Performed at [facility]   Culture     Final   Value: RARE STAPHYLOCOCCUS AUREUS     Note: RIFAMPIN AND GENTAMICIN SHOULD NOT BE USED AS SINGLE DRUGS FOR TREATMENT OF STAPH INFECTIONS.     Performed at [facility]   Report Status PENDING   Incomplete  CULTURE, BLOOD (ROUTINE X 2)     Status: None   Collection Time    05/13/13  6:00 PM      Result Value Range Status   Specimen Description BLOOD LEFT HAND   Final   Special Requests BOTTLES DRAWN AEROBIC AND ANAEROBIC 10CC   Final   Culture  Setup Time     Final   Value: 05/13/2013 22:00     Performed at [facility]   Culture     Final   Value:        BLOOD CULTURE RECEIVED NO GROWTH TO DATE CULTURE WILL BE HELD FOR 5 DAYS BEFORE ISSUING A FINAL NEGATIVE REPORT     Performed at [facility]   Report Status PENDING   Incomplete  BODY FLUID CULTURE     Status: None   Collection Time    05/13/13  6:12 PM      Result Value Range Status   Specimen Description COMMON BILE DUCT   Final   Special Requests NONE   Final   Gram Stain     Final   Value: NO WBC  SEEN     NO ORGANISMS SEEN     Performed at [facility]   Culture     Final   Value: NO GROWTH 2 DAYS     Performed at [facility]   Report Status PENDING   Incomplete  CULTURE, BLOOD (ROUTINE X 2)     Status: None   Collection Time    05/13/13  6:15 PM      Result Value Range Status   Specimen Description BLOOD RIGHT ARM   Final   Special Requests BOTTLES DRAWN AEROBIC AND ANAEROBIC 10CC   Final   Culture  Setup Time     Final   Value: 05/13/2013 22:00     Performed at [facility]   Culture     Final   Value:        BLOOD CULTURE RECEIVED NO GROWTH TO DATE CULTURE WILL BE HELD FOR 5 DAYS BEFORE ISSUING A FINAL NEGATIVE REPORT     Performed at [facility]   Report Status PENDING   Incomplete    Anti-infectives: Anti-infectives   Start     Dose/Rate Route Frequency Ordered Stop   05/13/13 2000  piperacillin-tazobactam (ZOSYN) IVPB 3.375 g     3.375 g 12.5 mL/hr over 240 Minutes Intravenous Every 8 hours 05/13/13 1603     05/13/13 1215  levofloxacin (LEVAQUIN) tablet 500 mg  Status:  Discontinued     500 mg Oral  Once 05/13/13 1213  05/13/13 1237   05/13/13 1200  piperacillin-tazobactam (ZOSYN) IVPB 3.375 g  Status:  Discontinued     3.375 g 12.5 mL/hr over 240 Minutes Intravenous  Once 05/13/13 1157 05/13/13 1158   05/13/13 1200  piperacillin-tazobactam (ZOSYN) IVPB 3.375 g     3.375 g 100 mL/hr over 30 Minutes Intravenous STAT 05/13/13 1159 05/13/13 1302   05/13/13 1000  vancomycin (VANCOCIN) IVPB 1000 mg/200 mL premix     1,000 mg 200 mL/hr over 60 Minutes Intravenous  Once 05/13/13 0937 05/13/13 1214      Assessment/Plan: s/p perc cholecystostomy 11/25; check abd film;cont drain irrigation, hydrate, monitor labs/check final cx's, ambulate; other plans as per [facility]  LOS: 3 days    Lamel Mccarley,D [facility] 05/16/2013

## 2013-05-17 LAB — COMPREHENSIVE METABOLIC PANEL
Albumin: 2.4 g/dL — ABNORMAL LOW (ref 3.5–5.2)
BUN: 29 mg/dL — ABNORMAL HIGH (ref 6–23)
CO2: 25 mEq/L (ref 19–32)
Calcium: 8.2 mg/dL — ABNORMAL LOW (ref 8.4–10.5)
Creatinine, Ser: 1.52 mg/dL — ABNORMAL HIGH (ref 0.50–1.35)
GFR calc Af Amer: 50 mL/min — ABNORMAL LOW (ref >90)
GFR calc non Af Amer: 43 mL/min — ABNORMAL LOW (ref >90)
Glucose, Bld: 90 mg/dL (ref 70–99)
Potassium: 4.5 mEq/L (ref 3.5–5.1)
Sodium: 136 mEq/L (ref 135–145)
Total Protein: 6.1 g/dL (ref 6.0–8.3)

## 2013-05-17 LAB — GLUCOSE, CAPILLARY
Glucose-Capillary: 74 mg/dL (ref 70–99)
Glucose-Capillary: 97 mg/dL (ref 70–99)
Glucose-Capillary: 90 mg/dL (ref 70–99)

## 2013-05-17 LAB — CBC
HCT: 28.4 % — ABNORMAL LOW (ref 39.0–52.0)
Hemoglobin: 9.6 g/dL — ABNORMAL LOW (ref 13.0–17.0)
MCHC: 33.8 g/dL (ref 30.0–36.0)
Platelets: 328 10*3/uL (ref 150–400)
RBC: 3.23 MIL/uL — ABNORMAL LOW (ref 4.22–5.81)
WBC: 8 10*3/uL (ref 4.0–10.5)

## 2013-05-17 LAB — BODY FLUID CULTURE: Gram Stain: NONE SEEN

## 2013-05-17 NOTE — Progress Notes (Signed)
Patient ID: Mike Mack, male   DOB: 1936-01-26, 77 y.o.   MRN: [id number]  General Surgery - [facility] - Progress Note  Subjective: Patient up to bathroom - multiple loose BM's overnight and this morning.  Denies pain.  Taking po liquid diet.  Wife at bedside.  Objective: Vital signs in last 24 hours: Temp:  [98.7 F (37.1 C)-99.9 F (37.7 C)] 98.7 F (37.1 C) (11/29 0435) Pulse Rate:  [81-84] 81 (11/29 0435) Resp:  [16-18] 18 (11/29 0435) BP: (134-140)/(69-71) 140/69 mmHg (11/29 0435) SpO2:  [94 %-97 %] 97 % (11/29 0435) Last BM Date: 05/17/13  Intake/Output from previous day: 06-10-23 0701 - 11/29 0700 In: 700 [P.O.:100; I.V.:600] Out: 445 [Urine:425; Blood:20]  Exam: HEENT - clear, not icteric Neck - soft Chest - clear bilaterally Cor - RRR, no murmur Abd - softer, still distended and protuberant; no tenderness; BS present; no mass; perc drain with thinning more bilious fluid Ext - no significant edema  Lab Results:   Recent Labs  06/09/2013 1616 05/17/13 0405  WBC 10.6* 8.0  HGB 10.4* 9.6*  HCT 30.2* 28.4*  PLT 323 328     Recent Labs  09-Jun-2013 0930 05/17/13 0405  NA 132* 136  K 4.4 4.5  CL 97 103  CO2 25 25  GLUCOSE 158* 90  BUN 33* 29*  CREATININE 1.59* 1.52*  CALCIUM 8.6 8.2*    Studies/Results: Dg Abd 2 Views  06-09-13   CLINICAL DATA:  Subsequent encounter for acute cholecystitis. Cholecystostomy tube placed 3 days ago. Current abdominal distention and generalized abdominal pain.  EXAM: ABDOMEN - 2 VIEW  COMPARISON:  CT abdomen and pelvis and acute abdomen series 05/13/2013.  FINDINGS: Moderate gaseous distention of numerous loops of small bowel throughout the abdomen, new since the examination 3 days ago. Moderate distension of the colon with gas and stool, unchanged. Mild to moderate gaseous distention of the stomach. Scattered air-fluid levels in the colon, small bowel and stomach on the lateral decubitus image without  evidence of free intraperitoneal air. Cholecystostomy tube in the right upper quadrant.  IMPRESSION: 1. Worsening generalized abdominal ileus. 2. No evidence of bowel obstruction or free intraperitoneal air. 3. Very large stool burden throughout the colon. 4. Cholecystostomy tube in the right upper quadrant.   Electronically Signed   By: Hulan Saas M.D.   On: 09-Jun-2013 09:35    Assessment / Plan: 1.  Acute cholecystitis  Cultures growing MRSA - Vanco added per medical service  Contact precautions in effect  Drain output noted - IR following 2.  Constipation / Ileus  Results with enemas  Continue miralax  Continue on liquid diet for now Encouraged patient and wife to ambulate in halls. Will follow.  Velora Heckler, MD, [facility] Office: [phone]  05/17/2013

## 2013-05-17 NOTE — Progress Notes (Signed)
[facility] PROGRESS NOTE  Atwood Adcock ZOX:[id number] DOB: [birth year] DOA: 05/13/2013 PCP: Michiel Sites, MD  Brief narrative: 77 year old male with past medical history of DM, small bowel obstruction, BPH, recent enterococcal UTI who presented to [facility] ED 05/13/2013 with ongoing RUQ abdominal pain, N/V, constipation and generalized weakness for past few days prior to this admission. In ED, patient was noted to be febrile with Tmax of 101.1F. CBC revealed WBC count of 18.9, hemoglobin of 10.6 and normal platelet count. BMP revealed sodium of 132 and creatinine of 1.45. Lipase level was 9 on this admission. Normal LFT's. Acute abdominal series were significant for constipation. Abd US revealed distended gallbladder but no pericholecystic fluid evident. CT abdomen revealed a large amount of stool present within the colon consistent with severe constipation; distention of the gallbladder with mild gallbladder wall thickening which may reflect development of acalculus cholecystitis; possible early pneumonia in the lower lobes posteriorly. Patient had biliary drain placed by IR with no subsequent complications. He was started on broad spectrum antibiotics on the admission.  Hospital course complicated with ongoing abdominal distention and constipation which has improved now with extensive bowel regimen including frequent enemas.   Assessment/Plan:   Principal Problem:  Acute cholecystitis  - status post percutaneous biliary drain placement 05/13/2013 by IR  - biliary fluid culture - final result grew MRSA so will add vanco to antibiotic regimen (now on zosyn and vanco) - blood cultures negative to date  - appreciated IR and surgery following  Active Problems:  Severe constipation, abdominal distention  - colace BID and miralax increase to 4 times a day, enema PRN as frequently as needed  - had BM x 4 in past 24 hours Diabetes mellitus without complication, uncontrolled  - A1c 7.8 on  this admission  - pt is on Levemir 50 units BID at home and metformin  - in the setting of an acute infection and renal failure continue to hold metformin  - order Levemir but half the dosage of home dose; so 25 units BID instead of 50 units BID  - CBG's in past 24 hours: 97, 90, 126 Hypertension  - continue norvasc 5 mg daily, avapro 300 mg at bedtime  BPH (benign prostatic hyperplasia)  - continue Flomax 0.4 mg daily  Enterococcus UTI  - urine culture grew enterococcus on 05/10/2013; with the addition of vanco for MRSA pos fluid cultures in biliary drain this will cover for enterococcus UTI  Leukocytosis  - due to acute cholecystitis and/or enterococcal UTI  - on zosyn and vancomycin - WBC count trending down from 18 to 12 --> WNL Acute renal failure  - likely prerenal, dehydration or metformin, vancomycin  - vanco and metformin are all discontinued  - creatinine as high as 1.71 but now slowly trending down to 1.52 Anemia of chronic disease  - related to history of diabetes  - hemoglobin 9.6 - no indications for transfusion  H/O CVA  - on aspirin for secondary stroke prevention   Code Status: full code  Family Communication: no family at the bedside  Disposition Plan: to SNF once stable   Consultants:  Surgery  Interventional radiology Procedures:  Percutaneous cholecystostomy 05/13/2013 by IR Antibiotics:  Vancomycin 05/13/2013 --> 05/14/2013; restarted 05/16/2013 -->  Zosyn 05/13/2013 -->   Manson Passey, MD  [facility] Pager [phone]  If 7PM-7AM, please contact night-coverage [email or web address] Password [email or web address] 05/17/2013, 7:24 AM   LOS: 4 days    HPI/Subjective: Had an episode of bright red blood  per rectum yesterday but he did not have BM at that time.  Objective: Filed Vitals:   05/15/13 2221 05/16/13 0555 05/16/13 1400 05/17/13 0435  BP: 158/81 131/71 134/71 140/69  Pulse: 102 89 84 81  Temp: 98.3 F (36.8 C) 99 F (37.2 C) 99.9 F (37.7 C) 98.7  F (37.1 C)  TempSrc: Oral Oral Oral Oral  Resp: 16 16 16 18   Height:      Weight:  73.2 kg (161 lb 6 oz)    SpO2: 94% 93% 94% 97%    Intake/Output Summary (Last 24 hours) at 05/17/13 0724 Last data filed at 05/17/13 0435  Gross per 24 hour  Intake    700 ml  Output    445 ml  Net    255 ml    Exam:   General:  Pt is sleeping in no acute distress  Cardiovascular: Regular rate and rhythm, S1/S2 appreciated  Respiratory: Clear to auscultation bilaterally, no wheezing, no crackles, no rhonchi  Abdomen: distended, perc drain in place   Extremities: No edema, pulses DP and PT palpable bilaterally  Neuro: Grossly nonfocal  Data Reviewed: Basic Metabolic Panel:  Recent Labs Lab 05/13/13 0935 05/14/13 0340 05/15/13 0755 05/16/13 0930 05/17/13 0405  NA 132* 132* 127* 132* 136  K 4.2 4.6 4.4 4.4 4.5  CL 95* 98 94* 97 103  CO2 26 22 22 25 25   GLUCOSE 62* 155* 182* 158* 90  BUN 22 30* 38* 33* 29*  CREATININE 1.45* 1.61* 1.71* 1.59* 1.52*  CALCIUM 9.5 8.5 8.4 8.6 8.2*  MG 2.6*  --   --   --   --    Liver Function Tests:  Recent Labs Lab 05/13/13 0935 05/14/13 0340 05/15/13 0755 05/17/13 0405  AST 26 48* 46* 25  ALT 16 32 34 27  ALKPHOS 87 86 84 72  BILITOT 0.9 1.2 0.8 0.5  PROT 7.5 6.6 6.6 6.1  ALBUMIN 3.3* 2.7* 2.5* 2.4*    Recent Labs Lab 05/13/13 0935  LIPASE 9*   No results found for this basename: AMMONIA,  in the last 168 hours CBC:  Recent Labs Lab 05/13/13 0935 05/14/13 0340 05/15/13 0755 05/16/13 1616 05/17/13 0405  WBC 18.9* 18.0* 12.1* 10.6* 8.0  NEUTROABS 16.8*  --   --   --   --   HGB 10.6* 9.7* 9.7* 10.4* 9.6*  HCT 31.9* 29.0* 28.1* 30.2* 28.4*  MCV 89.4 88.7 87.3 87.3 87.9  PLT 236 226 264 323 328   Cardiac Enzymes: No results found for this basename: CKTOTAL, CKMB, CKMBINDEX, TROPONINI,  in the last 168 hours BNP: No components found with this basename: POCBNP,  CBG:  Recent Labs Lab 05/16/13 2108 05/16/13 2215  05/16/13 2302 05/17/13 0108 05/17/13 0433  GLUCAP 68* 65* 74 74 97    Recent Results (from the past 240 hour(s))  URINE CULTURE     Status: None   Collection Time    05/10/13  1:27 AM      Result Value Range Status   Specimen Description URINE, RANDOM   Final   Special Requests NONE   Final   Culture  Setup Time     Final   Value: 05/10/2013 14:29     Performed at [facility] Count     Final   Value: >=100,000 COLONIES/ML     Performed at [facility]   Culture     Final   Value: ENTEROCOCCUS SPECIES  Performed at [facility]   Report Status 05/12/2013 FINAL   Final   Organism ID, Bacteria ENTEROCOCCUS SPECIES   Final  CULTURE, ROUTINE-ABSCESS     Status: None   Collection Time    05/13/13  5:47 PM      Result Value Range Status   Specimen Description OTHER   Final   Special Requests NONE   Final   Gram Stain     Final   Value: NO WBC SEEN     NO SQUAMOUS EPITHELIAL CELLS SEEN     NO ORGANISMS SEEN     Performed at [facility]   Culture     Final   Value: RARE METHICILLIN RESISTANT STAPHYLOCOCCUS AUREUS     Note: RIFAMPIN AND GENTAMICIN SHOULD NOT BE USED AS SINGLE DRUGS FOR TREATMENT OF STAPH INFECTIONS. CRITICAL RESULT CALLED TO, READ BACK BY AND VERIFIED WITH: MATTHEW WRIGHT 05/16/13 1045 BY SMITHERSJ     Performed at [facility]   Report Status 05/16/2013 FINAL   Final   Organism ID, Bacteria METHICILLIN RESISTANT STAPHYLOCOCCUS AUREUS   Final  CULTURE, BLOOD (ROUTINE X 2)     Status: None   Collection Time    05/13/13  6:00 PM      Result Value Range Status   Specimen Description BLOOD LEFT HAND   Final   Special Requests BOTTLES DRAWN AEROBIC AND ANAEROBIC 10CC   Final   Culture  Setup Time     Final   Value: 05/13/2013 22:00     Performed at [facility]   Culture     Final   Value:        BLOOD CULTURE RECEIVED NO GROWTH TO DATE CULTURE WILL BE HELD FOR 5 DAYS BEFORE ISSUING A FINAL NEGATIVE  REPORT     Performed at [facility]   Report Status PENDING   Incomplete  BODY FLUID CULTURE     Status: None   Collection Time    05/13/13  6:12 PM      Result Value Range Status   Specimen Description COMMON BILE DUCT   Final   Special Requests NONE   Final   Gram Stain     Final   Value: NO WBC SEEN     NO ORGANISMS SEEN     Performed at [facility]   Culture     Final   Value: NO GROWTH 3 DAYS     Performed at [facility]   Report Status PENDING   Incomplete  CULTURE, BLOOD (ROUTINE X 2)     Status: None   Collection Time    05/13/13  6:15 PM      Result Value Range Status   Specimen Description BLOOD RIGHT ARM   Final   Special Requests BOTTLES DRAWN AEROBIC AND ANAEROBIC 10CC   Final   Culture  Setup Time     Final   Value: 05/13/2013 22:00     Performed at [facility]   Culture     Final   Value:        BLOOD CULTURE RECEIVED NO GROWTH TO DATE CULTURE WILL BE HELD FOR 5 DAYS BEFORE ISSUING A FINAL NEGATIVE REPORT     Performed at [facility]   Report Status PENDING   Incomplete     Studies: Dg Abd 2 Views 05/16/2013    IMPRESSION: 1. Worsening generalized abdominal ileus. 2. No evidence of bowel obstruction or free  intraperitoneal air. 3. Very large stool burden throughout the colon. 4. Cholecystostomy tube in the right upper quadrant.   Electronically Signed   By: Hulan Saas M.D.   On: 05/16/2013 09:35    Scheduled Meds: . amLODipine  5 mg Oral q morning - 10a  . aspirin  325 mg Oral Daily  . darifenacin  15 mg Oral Daily  . docusate sodium  100 mg Oral BID  . insulin aspart  0-20 Units Subcutaneous TID WC  . insulin aspart  0-5 Units Subcutaneous QHS  . insulin detemir  25 Units Subcutaneous BID  . irbesartan  300 mg Oral QHS  . pantoprazole (PROTONIX) IV  40 mg Intravenous Q24H  . piperacillin-tazobactam   3.375 g Intravenous Q8H  . polyethylene glycol  17 g Oral TID PC & HS  . predniSONE  6 mg Oral Q  breakfast  . sodium phosphate  1 enema Rectal Once  . tamsulosin  0.4 mg Oral BID  . vancomycin  1,000 mg Intravenous Q24H   Continuous Infusions: . sodium chloride 75 mL/hr at 05/16/13 0429

## 2013-05-18 LAB — GLUCOSE, CAPILLARY
Glucose-Capillary: 38 mg/dL — CL (ref 70–99)
Glucose-Capillary: 65 mg/dL — ABNORMAL LOW (ref 70–99)
Glucose-Capillary: 148 mg/dL — ABNORMAL HIGH (ref 70–99)
Glucose-Capillary: 151 mg/dL — ABNORMAL HIGH (ref 70–99)
Glucose-Capillary: 156 mg/dL — ABNORMAL HIGH (ref 70–99)

## 2013-05-18 NOTE — Progress Notes (Signed)
Patient ID: Mike Mack, male   DOB: Oct 16, 1935, 77 y.o.   MRN: [id number]  General Surgery - [facility] - Progress Note  Subjective: Patient with profuse loose BM's overnight and this morning.  Denies abdominal pain.  Tolerating clear liquid diet.  Objective: Vital signs in last 24 hours: Temp:  [98.7 F (37.1 C)-98.8 F (37.1 C)] 98.8 F (37.1 C) (11/29 2127) Pulse Rate:  [74-79] 79 (11/29 2127) Resp:  [18] 18 (11/29 2127) BP: (137-152)/(70-72) 152/72 mmHg (11/29 2127) SpO2:  [95 %-97 %] 97 % (11/29 2127) Last BM Date: 05/18/13  Intake/Output from previous day: 11/29 0701 - 11/30 0700 In: 1755 [P.O.:480; I.V.:1275] Out: 525 [Urine:400; Drains:125]  Exam: HEENT - clear, not icteric Neck - soft Chest - coarse bilaterally Cor - RRR, no murmur Abd - protuberant, less distended; softer to palpation; mild tender right mid abdomen; drain RUQ with thin reddish fluid Ext - no significant edema  Lab Results:   Recent Labs  June 11, 2013 1616 05/17/13 0405  WBC 10.6* 8.0  HGB 10.4* 9.6*  HCT 30.2* 28.4*  PLT 323 328     Recent Labs  06/11/13 0930 05/17/13 0405  NA 132* 136  K 4.4 4.5  CL 97 103  CO2 25 25  GLUCOSE 158* 90  BUN 33* 29*  CREATININE 1.59* 1.52*  CALCIUM 8.6 8.2*    Studies/Results: Dg Abd 2 Views  Jun 11, 2013   CLINICAL DATA:  Subsequent encounter for acute cholecystitis. Cholecystostomy tube placed 3 days ago. Current abdominal distention and generalized abdominal pain.  EXAM: ABDOMEN - 2 VIEW  COMPARISON:  CT abdomen and pelvis and acute abdomen series 05/13/2013.  FINDINGS: Moderate gaseous distention of numerous loops of small bowel throughout the abdomen, new since the examination 3 days ago. Moderate distension of the colon with gas and stool, unchanged. Mild to moderate gaseous distention of the stomach. Scattered air-fluid levels in the colon, small bowel and stomach on the lateral decubitus image without evidence of free  intraperitoneal air. Cholecystostomy tube in the right upper quadrant.  IMPRESSION: 1. Worsening generalized abdominal ileus. 2. No evidence of bowel obstruction or free intraperitoneal air. 3. Very large stool burden throughout the colon. 4. Cholecystostomy tube in the right upper quadrant.   Electronically Signed   By: Hulan Saas M.D.   On: 06/11/2013 09:35    Assessment / Plan: 1.  Acute cholecystitis  IV Vanco and Zosyn  Perc drain in place 2.  Constipation  Resolving on present bowel regimen  Will advance to full liquid diet today.   Velora Heckler, MD, [facility] Office: [phone]  05/18/2013

## 2013-05-18 NOTE — Progress Notes (Signed)
Hypoglycemic Event  CBG:65  Treatment: 15 GM carbohydrate snack  Symptoms: None  Follow-up CBG: Time:0900 CBG Result:148  Possible Reasons for Event: Unknown  Comments/MD notified:Yes    Jobe Gibbon  Remember to initiate Hypoglycemia Order Set & complete

## 2013-05-18 NOTE — Progress Notes (Signed)
Subjective: Pt feeling better. Bowels finally moving pretty well. Diet is being advanced. He denies much pain  Objective: Physical Exam: BP 152/72  Pulse 79  Temp(Src) 98.8 F (37.1 C) (Oral)  Resp 18  Ht 5' (1.524 m)  Wt 161 lb 6 oz (73.2 kg)  BMI 31.52 kg/m2  SpO2 97% Drain intact, site clean, NT Output thin blood tinged serous   Labs: CBC  Recent Labs  05/16/13 1616 05/17/13 0405  WBC 10.6* 8.0  HGB 10.4* 9.6*  HCT 30.2* 28.4*  PLT 323 328   BMET  Recent Labs  05/16/13 0930 05/17/13 0405  NA 132* 136  K 4.4 4.5  CL 97 103  CO2 25 25  GLUCOSE 158* 90  BUN 33* 29*  CREATININE 1.59* 1.52*  CALCIUM 8.6 8.2*   LFT  Recent Labs  05/17/13 0405  PROT 6.1  ALBUMIN 2.4*  AST 25  ALT 27  ALKPHOS 72  BILITOT 0.5   PT/INR No results found for this basename: LABPROT, INR,  in the last 72 hours   Studies/Results: No results found.  Assessment/Plan: Acute cholecystitis S/p perc chole drain on 11/25 Drain stable, clinically improving Plan per Primary and [facility] Call IR with issues.    LOS: 5 days    Brayton El PA-C 05/18/2013 11:41 AM

## 2013-05-18 NOTE — Progress Notes (Signed)
[facility] PROGRESS NOTE  Garhett Bernhard ZOX:[id number] DOB: 1936/04/06 DOA: 05/13/2013 PCP: Michiel Sites, MD  Brief narrative: 77 year old male with past medical history of DM, small bowel obstruction, BPH, recent enterococcal UTI who presented to [facility] ED 05/13/2013 with ongoing RUQ abdominal pain, N/V, constipation and generalized weakness for past few days prior to this admission. In ED, patient was noted to be febrile with Tmax of 101.22F. CBC revealed WBC count of 18.9, hemoglobin of 10.6 and normal platelet count. BMP revealed sodium of 132 and creatinine of 1.45. Lipase level was 9 on this admission. Normal LFT's. Acute abdominal series were significant for constipation. Abd US revealed distended gallbladder but no pericholecystic fluid evident. CT abdomen revealed a large amount of stool present within the colon consistent with severe constipation; distention of the gallbladder with mild gallbladder wall thickening which may reflect development of acalculus cholecystitis; possible early pneumonia in the lower lobes posteriorly. Patient had biliary drain placed by IR with no subsequent complications. He was started on broad spectrum antibiotics on the admission.  Hospital course complicated with ongoing abdominal distention and constipation which has improved now with extensive bowel regimen including frequent enemas.   Assessment/Plan:   Principal Problem:  Acute cholecystitis  - status post percutaneous biliary drain placement 05/13/2013 by IR  - biliary fluid culture - final result grew MRSA so pt is now on vanco in addition to zosyn - blood cultures negative to date  - appreciated IR and surgery following  Active Problems:  Severe constipation, abdominal distention  - colace BID and miralax 4 times a day, enema PRN as frequently as needed  - had BM x 9 over past 48 hours  Diabetes mellitus without complication, uncontrolled  - A1c 7.8 on this admission  - in the setting  of an acute infection and renal failure continue to hold metformin  - CBG's in past 24 hours: 38, 65, 148 - hold Levemir for now due to episodes of hypoglycemia Hypertension  - continue norvasc 5 mg daily, avapro 300 mg at bedtime  BPH (benign prostatic hyperplasia)  - continue Flomax 0.4 mg daily  Enterococcus UTI  - urine culture grew enterococcus on 05/10/2013; with the addition of vanco for MRSA pos fluid cultures in biliary drain this will cover for enterococcus UTI  Leukocytosis  - due to acute cholecystitis and/or enterococcal UTI  - on zosyn and vancomycin  - WBC count trending down from 18 to 12 --> WNL  Acute renal failure  - likely prerenal, dehydration or metformin, vancomycin  - vanco and metformin are all discontinued  - creatinine as high as 1.71 but now slowly trending down to 1.52  Anemia of chronic disease  - related to history of diabetes  - hemoglobin 9.6  - no indications for transfusion  H/O CVA  - on aspirin for secondary stroke prevention   Code Status: full code  Family Communication: no family at the bedside  Disposition Plan: to SNF once stable   Consultants:  Surgery  Interventional radiology Procedures:  Percutaneous cholecystostomy 05/13/2013 by IR Antibiotics:  Vancomycin 05/13/2013 --> 05/14/2013; restarted 05/16/2013 -->  Zosyn 05/13/2013 -->   Manson Passey, MD  [facility] Pager [phone]  If 7PM-7AM, please contact night-coverage [email or web address] Password [email or web address] 05/18/2013, 10:21 AM   LOS: 5 days    HPI/Subjective: No acute overnight events.  Objective: Filed Vitals:   05/16/13 1400 05/17/13 0435 05/17/13 1421 05/17/13 2127  BP: 134/71 140/69 137/70 152/72  Pulse: 84 81 74  79  Temp: 99.9 F (37.7 C) 98.7 F (37.1 C) 98.7 F (37.1 C) 98.8 F (37.1 C)  TempSrc: Oral Oral Oral Oral  Resp: 16 18 18 18   Height:      Weight:      SpO2: 94% 97% 95% 97%    Intake/Output Summary (Last 24 hours) at 05/18/13 1021 Last  data filed at 05/18/13 0752  Gross per 24 hour  Intake   1635 ml  Output    525 ml  Net   1110 ml    Exam:   General:  Pt is alert, follows commands appropriately, not in acute distress  Cardiovascular: Regular rate and rhythm, S1/S2, no murmurs, no rubs, no gallops  Respiratory: Clear to auscultation bilaterally, no wheezing, no crackles, no rhonchi  Abdomen: perc drain in place, abd distended about the same since yesterday  Extremities: No edema, pulses DP and PT palpable bilaterally  Neuro: Grossly nonfocal  Data Reviewed: Basic Metabolic Panel:  Recent Labs Lab 05/13/13 0935 05/14/13 0340 05/15/13 0755 05/16/13 0930 05/17/13 0405  NA 132* 132* 127* 132* 136  K 4.2 4.6 4.4 4.4 4.5  CL 95* 98 94* 97 103  CO2 26 22 22 25 25   GLUCOSE 62* 155* 182* 158* 90  BUN 22 30* 38* 33* 29*  CREATININE 1.45* 1.61* 1.71* 1.59* 1.52*  CALCIUM 9.5 8.5 8.4 8.6 8.2*  MG 2.6*  --   --   --   --    Liver Function Tests:  Recent Labs Lab 05/13/13 0935 05/14/13 0340 05/15/13 0755 05/17/13 0405  AST 26 48* 46* 25  ALT 16 32 34 27  ALKPHOS 87 86 84 72  BILITOT 0.9 1.2 0.8 0.5  PROT 7.5 6.6 6.6 6.1  ALBUMIN 3.3* 2.7* 2.5* 2.4*    Recent Labs Lab 05/13/13 0935  LIPASE 9*   No results found for this basename: AMMONIA,  in the last 168 hours CBC:  Recent Labs Lab 05/13/13 0935 05/14/13 0340 05/15/13 0755 05/16/13 1616 05/17/13 0405  WBC 18.9* 18.0* 12.1* 10.6* 8.0  NEUTROABS 16.8*  --   --   --   --   HGB 10.6* 9.7* 9.7* 10.4* 9.6*  HCT 31.9* 29.0* 28.1* 30.2* 28.4*  MCV 89.4 88.7 87.3 87.3 87.9  PLT 236 226 264 323 328   Cardiac Enzymes: No results found for this basename: CKTOTAL, CKMB, CKMBINDEX, TROPONINI,  in the last 168 hours BNP: No components found with this basename: POCBNP,  CBG:  Recent Labs Lab 05/17/13 1700 05/17/13 2133 05/18/13 0755 05/18/13 0816 05/18/13 0930  GLUCAP 72 92 38* 65* 148*    Recent Results (from the past 240 hour(s))   URINE CULTURE     Status: None   Collection Time    05/10/13  1:27 AM      Result Value Range Status   Specimen Description URINE, RANDOM   Final   Special Requests NONE   Final   Culture  Setup Time     Final   Value: 05/10/2013 14:29     Performed at [facility] Count     Final   Value: >=100,000 COLONIES/ML     Performed at [facility]   Culture     Final   Value: ENTEROCOCCUS SPECIES     Performed at [facility]   Report Status 05/12/2013 FINAL   Final   Organism ID, Bacteria ENTEROCOCCUS SPECIES   Final  CULTURE, ROUTINE-ABSCESS  Status: None   Collection Time    05/13/13  5:47 PM      Result Value Range Status   Specimen Description OTHER   Final   Special Requests NONE   Final   Gram Stain     Final   Value: NO WBC SEEN     NO SQUAMOUS EPITHELIAL CELLS SEEN     NO ORGANISMS SEEN     Performed at [facility]   Culture     Final   Value: RARE METHICILLIN RESISTANT STAPHYLOCOCCUS AUREUS     Note: RIFAMPIN AND GENTAMICIN SHOULD NOT BE USED AS SINGLE DRUGS FOR TREATMENT OF STAPH INFECTIONS. CRITICAL RESULT CALLED TO, READ BACK BY AND VERIFIED WITH: MATTHEW WRIGHT 05/16/13 1045 BY SMITHERSJ     Performed at [facility]   Report Status 05/16/2013 FINAL   Final   Organism ID, Bacteria METHICILLIN RESISTANT STAPHYLOCOCCUS AUREUS   Final  CULTURE, BLOOD (ROUTINE X 2)     Status: None   Collection Time    05/13/13  6:00 PM      Result Value Range Status   Specimen Description BLOOD LEFT HAND   Final   Special Requests BOTTLES DRAWN AEROBIC AND ANAEROBIC 10CC   Final   Culture  Setup Time     Final   Value: 05/13/2013 22:00     Performed at [facility]   Culture     Final   Value:        BLOOD CULTURE RECEIVED NO GROWTH TO DATE CULTURE WILL BE HELD FOR 5 DAYS BEFORE ISSUING A FINAL NEGATIVE REPORT     Performed at [facility]   Report Status PENDING   Incomplete  BODY FLUID CULTURE     Status: None    Collection Time    05/13/13  6:12 PM      Result Value Range Status   Specimen Description COMMON BILE DUCT   Final   Special Requests NONE   Final   Gram Stain     Final   Value: NO WBC SEEN     NO ORGANISMS SEEN     Performed at [facility]   Culture     Final   Value: NO GROWTH 3 DAYS     Performed at [facility]   Report Status 05/17/2013 FINAL   Final  CULTURE, BLOOD (ROUTINE X 2)     Status: None   Collection Time    05/13/13  6:15 PM      Result Value Range Status   Specimen Description BLOOD RIGHT ARM   Final   Special Requests BOTTLES DRAWN AEROBIC AND ANAEROBIC 10CC   Final   Culture  Setup Time     Final   Value: 05/13/2013 22:00     Performed at [facility]   Culture     Final   Value:        BLOOD CULTURE RECEIVED NO GROWTH TO DATE CULTURE WILL BE HELD FOR 5 DAYS BEFORE ISSUING A FINAL NEGATIVE REPORT     Performed at [facility]   Report Status PENDING   Incomplete     Studies: No results found.  Scheduled Meds: . amLODipine  5 mg Oral q morning - 10a  . aspirin  325 mg Oral Daily  . darifenacin  15 mg Oral Daily  . docusate sodium  100 mg Oral BID  . insulin aspart  0-20 Units Subcutaneous TID WC  .  insulin aspart  0-5 Units Subcutaneous QHS  . insulin detemir  25 Units Subcutaneous BID  . irbesartan  300 mg Oral QHS  . latanoprost  1 drop Both Eyes QHS  . pantoprazole   40 mg Intravenous Q24H  . piperacillin-tazobactam   3.375 g Intravenous Q8H  . polyethylene glycol  17 g Oral TID PC & HS  . predniSONE  6 mg Oral Q breakfast  . sodium phosphate  1 enema Rectal Once  . tamsulosin  0.4 mg Oral BID  . vancomycin  1,000 mg Intravenous Q24H   Continuous Infusions: . sodium chloride 75 mL/hr at 05/18/13 0415

## 2013-05-18 NOTE — Progress Notes (Signed)
Hypoglycemic Event  CBG: 38  Treatment: 15 GM carbohydrate snack  Symptoms: Shaky  Follow-up CBG: Time:0820 CBG Result:65  Possible Reasons for Event: Unknown  Comments/MD notified:Yes    Jobe Gibbon  Remember to initiate Hypoglycemia Order Set & complete

## 2013-05-18 NOTE — Progress Notes (Signed)
Attempted to meet with Pt.  Pt sleeping soundly.  Contacted Pt's wife via phone.  Pt's wife stated that she hasn't been able to tour any of the facilities that have offered Pt a bed but she stated that she's leaning towards [facility].    CSW thanked Pt's wife for her time and stated that Weekday CSW will continue to follow Pt for assistance.  Providence Crosby, LCSWA Clinical Social Work [phone]

## 2013-05-19 LAB — CULTURE, BLOOD (ROUTINE X 2)

## 2013-05-19 LAB — CBC
Hemoglobin: 9.5 g/dL — ABNORMAL LOW (ref 13.0–17.0)
RBC: 3.2 MIL/uL — ABNORMAL LOW (ref 4.22–5.81)
WBC: 9.9 10*3/uL (ref 4.0–10.5)

## 2013-05-19 LAB — BASIC METABOLIC PANEL
CO2: 20 mEq/L (ref 19–32)
Calcium: 8.2 mg/dL — ABNORMAL LOW (ref 8.4–10.5)
Chloride: 107 mEq/L (ref 96–112)
Creatinine, Ser: 1.26 mg/dL (ref 0.50–1.35)
GFR calc non Af Amer: 54 mL/min — ABNORMAL LOW (ref >90)
Glucose, Bld: 92 mg/dL (ref 70–99)
Potassium: 3.9 mEq/L (ref 3.5–5.1)
Sodium: 136 mEq/L (ref 135–145)

## 2013-05-19 LAB — GLUCOSE, CAPILLARY
Glucose-Capillary: 104 mg/dL — ABNORMAL HIGH (ref 70–99)
Glucose-Capillary: 66 mg/dL — ABNORMAL LOW (ref 70–99)

## 2013-05-19 LAB — VANCOMYCIN, TROUGH: Vancomycin Tr: 8.2 ug/mL — ABNORMAL LOW (ref 10.0–20.0)

## 2013-05-19 MED ORDER — ALUM & MAG HYDROXIDE-SIMETH 200-200-20 MG/5ML PO SUSP: 30.0000 mL | Freq: Four times a day (QID) | ORAL | Status: DC | PRN

## 2013-05-19 MED ORDER — POLYETHYLENE GLYCOL 3350 17 G PO PACK: 17.0000 g | PACK | Freq: Three times a day (TID) | ORAL | Status: DC

## 2013-05-19 MED ORDER — AMOXICILLIN-POT CLAVULANATE 875-125 MG PO TABS: 1.0000 | ORAL_TABLET | Freq: Two times a day (BID) | ORAL | Status: DC

## 2013-05-19 MED ORDER — ONDANSETRON HCL 4 MG PO TABS: 4.0000 mg | ORAL_TABLET | Freq: Four times a day (QID) | ORAL | Status: DC | PRN

## 2013-05-19 MED ORDER — FLEET ENEMA 7-19 GM/118ML RE ENEM: 1.0000 | ENEMA | Freq: Every day | RECTAL | Status: DC | PRN

## 2013-05-19 MED ORDER — DOXYCYCLINE HYCLATE 100 MG PO TABS: 100.0000 mg | ORAL_TABLET | Freq: Two times a day (BID) | ORAL | Status: DC

## 2013-05-19 MED ORDER — ALBUTEROL SULFATE (5 MG/ML) 0.5% IN NEBU: 2.5000 mg | INHALATION_SOLUTION | Freq: Four times a day (QID) | RESPIRATORY_TRACT | Status: DC | PRN

## 2013-05-19 MED ORDER — ACETAMINOPHEN 325 MG PO TABS: 650.0000 mg | ORAL_TABLET | Freq: Four times a day (QID) | ORAL | Status: DC | PRN

## 2013-05-19 MED ORDER — INSULIN DETEMIR 100 UNIT/ML ~~LOC~~ SOLN: 25.0000 [IU] | Freq: Two times a day (BID) | SUBCUTANEOUS | Status: DC

## 2013-05-19 MED ORDER — MORPHINE SULFATE 15 MG PO TABS: 15.0000 mg | ORAL_TABLET | ORAL | Status: DC | PRN

## 2013-05-19 MED ORDER — TRAMADOL HCL 50 MG PO TABS: 50.0000 mg | ORAL_TABLET | Freq: Four times a day (QID) | ORAL | Status: DC | PRN

## 2013-05-19 MED ORDER — GUAIFENESIN 100 MG/5ML PO SOLN: 200.0000 mg | ORAL | Status: DC | PRN

## 2013-05-19 MED ORDER — DSS 100 MG PO CAPS: 100.0000 mg | ORAL_CAPSULE | Freq: Two times a day (BID) | ORAL | Status: DC

## 2013-05-19 NOTE — Progress Notes (Addendum)
CSW continuing to follow for SNF placement.  CSW attempted to meet with pt at bedside, however RN assisting pt to bedside commode at this time.   CSW contacted pt wife via telephone as pt wife had left this CSW a voice message re: interest in [facility].  CSW spoke with pt wife via telephone. CSW discussed that [facility] had not offered a bed during initial SNF search, but CSW had left voice message with admissions to clarify. CSW inquired with pt wife about what would be secondary option for SNF. Pt wife voiced interest in [facility]. Pt wife stated that she planned to tour [facility] this morning while CSW was awaiting return call from [facility].   CSW to continue to follow to assist with pt discharge planning needs.   Per MD, pt likely medically ready for discharge today.  Addendum 11:15 am:  CSW met with pt and pt wife at bedside.  Pt wife reports that she toured [facility] and reports that facility is "nice", but was interested in touring a few other facilities. CSW reviewed bed offers with pt and pt wife and pt wife plans to tour [facility] and [facility].  CSW did receive return phone call from [facility] and facility plans to re-review pt clinicals and notify this CSW if facility is able to offer pt a bed.  CSW provided support as pt wife expressed anxiety about potential discharge today. CSW requested for MD to speak with pt wife while pt wife was present at bedside.  CSW to follow up with pt and pt wife re: decision for SNF.  CSW awaiting determination if pt ready for d/c today vs tomorrow.  Jacklynn Lewis, MSW, LCSWA  Clinical Social Work [phone]

## 2013-05-19 NOTE — Progress Notes (Signed)
Patient ID: Mike Mack, male   DOB: Apr 27, 1936, 77 y.o.   MRN: [id number] [facility] Surgery Progress Note:   * No surgery found *  Subjective: Mental status is fairly clear.  Says he is feeling better Objective: Vital signs in last 24 hours: Temp:  [98.6 F (37 C)-99 F (37.2 C)] 98.7 F (37.1 C) (12/01 0500) Pulse Rate:  [70-75] 73 (12/01 0500) Resp:  [16] 16 (12/01 0500) BP: (117-142)/(64-80) 142/80 mmHg (12/01 0500) SpO2:  [99 %-100 %] 100 % (12/01 0500)  Intake/Output from previous day: 11/30 0701 - 12/01 0700 In: 1680 [P.O.:480; I.V.:1200] Out: 1910 [Urine:1900; Drains:10] Intake/Output this shift:    Physical Exam: Work of breathing is not labored.  Percutaneous drain is clearer.  Abdomen is not tender.    Lab Results:  Results for orders placed during the hospital encounter of 05/13/13 (from the past 48 hour(s))  GLUCOSE, CAPILLARY     Status: None   Collection Time    05/17/13  5:00 PM      Result Value Range   Glucose-Capillary 72  70 - 99 mg/dL  GLUCOSE, CAPILLARY     Status: None   Collection Time    05/17/13  9:33 PM      Result Value Range   Glucose-Capillary 92  70 - 99 mg/dL   Comment 1 Notify RN    GLUCOSE, CAPILLARY     Status: Abnormal   Collection Time    05/18/13  7:55 AM      Result Value Range   Glucose-Capillary 38 (*) 70 - 99 mg/dL  GLUCOSE, CAPILLARY     Status: Abnormal   Collection Time    05/18/13  8:16 AM      Result Value Range   Glucose-Capillary 65 (*) 70 - 99 mg/dL  GLUCOSE, CAPILLARY     Status: Abnormal   Collection Time    05/18/13  9:30 AM      Result Value Range   Glucose-Capillary 148 (*) 70 - 99 mg/dL  GLUCOSE, CAPILLARY     Status: Abnormal   Collection Time    05/18/13 11:59 AM      Result Value Range   Glucose-Capillary 151 (*) 70 - 99 mg/dL  GLUCOSE, CAPILLARY     Status: Abnormal   Collection Time    05/18/13  4:59 PM      Result Value Range   Glucose-Capillary 156 (*) 70 - 99 mg/dL  GLUCOSE,  CAPILLARY     Status: Abnormal   Collection Time    05/18/13  9:01 PM      Result Value Range   Glucose-Capillary 179 (*) 70 - 99 mg/dL   Comment 1 Documented in Chart     Comment 2 Notify RN    GLUCOSE, CAPILLARY     Status: Abnormal   Collection Time    05/19/13  2:33 AM      Result Value Range   Glucose-Capillary 104 (*) 70 - 99 mg/dL   Comment 1 Documented in Chart     Comment 2 Notify RN    BASIC METABOLIC PANEL     Status: Abnormal   Collection Time    05/19/13  4:24 AM      Result Value Range   Sodium 136  135 - 145 mEq/L   Potassium 3.9  3.5 - 5.1 mEq/L   Chloride 107  96 - 112 mEq/L   CO2 20  19 - 32 mEq/L   Glucose, Bld 92  70 -  99 mg/dL   BUN 9  6 - 23 mg/dL   Creatinine, Ser 1.61  0.50 - 1.35 mg/dL   Calcium 8.2 (*) 8.4 - 10.5 mg/dL   GFR calc non Af Amer 54 (*) >90 mL/min   GFR calc Af Amer 63 (*) >90 mL/min   Comment: (NOTE)     The eGFR has been calculated using the CKD EPI equation.     This calculation has not been validated in all clinical situations.     eGFR's persistently <90 mL/min signify possible Chronic Kidney     Disease.  CBC     Status: Abnormal   Collection Time    05/19/13  4:24 AM      Result Value Range   WBC 9.9  4.0 - 10.5 K/uL   RBC 3.20 (*) 4.22 - 5.81 MIL/uL   Hemoglobin 9.5 (*) 13.0 - 17.0 g/dL   HCT 09.6 (*) 04.5 - 40.9 %   MCV 87.5  78.0 - 100.0 fL   MCH 29.7  26.0 - 34.0 pg   MCHC 33.9  30.0 - 36.0 g/dL   RDW 81.1  91.4 - 78.2 %   Platelets 407 (*) 150 - 400 K/uL  GLUCOSE, CAPILLARY     Status: Abnormal   Collection Time    05/19/13  7:26 AM      Result Value Range   Glucose-Capillary 66 (*) 70 - 99 mg/dL  GLUCOSE, CAPILLARY     Status: Abnormal   Collection Time    05/19/13  9:00 AM      Result Value Range   Glucose-Capillary 163 (*) 70 - 99 mg/dL  GLUCOSE, CAPILLARY     Status: Abnormal   Collection Time    05/19/13 12:00 PM      Result Value Range   Glucose-Capillary 150 (*) 70 - 99 mg/dL     Radiology/Results: No results found.  Anti-infectives: Anti-infectives   Start     Dose/Rate Route Frequency Ordered Stop   05/19/13 0000  amoxicillin-clavulanate (AUGMENTIN) 875-125 MG per tablet     1 tablet Oral 2 times daily 05/19/13 1144     05/19/13 0000  doxycycline (VIBRA-TABS) 100 MG tablet     100 mg Oral 2 times daily 05/19/13 1144     05/16/13 1200  vancomycin (VANCOCIN) IVPB 1000 mg/200 mL premix     1,000 mg 200 mL/hr over 60 Minutes Intravenous Every 24 hours 05/16/13 1125     05/13/13 2000  piperacillin-tazobactam (ZOSYN) IVPB 3.375 g     3.375 g 12.5 mL/hr over 240 Minutes Intravenous Every 8 hours 05/13/13 1603     05/13/13 1215  levofloxacin (LEVAQUIN) tablet 500 mg  Status:  Discontinued     500 mg Oral  Once 05/13/13 1213 05/13/13 1237   05/13/13 1200  piperacillin-tazobactam (ZOSYN) IVPB 3.375 g  Status:  Discontinued     3.375 g 12.5 mL/hr over 240 Minutes Intravenous  Once 05/13/13 1157 05/13/13 1158   05/13/13 1200  piperacillin-tazobactam (ZOSYN) IVPB 3.375 g     3.375 g 100 mL/hr over 30 Minutes Intravenous STAT 05/13/13 1159 05/13/13 1302   05/13/13 1000  vancomycin (VANCOCIN) IVPB 1000 mg/200 mL premix     1,000 mg 200 mL/hr over 60 Minutes Intravenous  Once 05/13/13 9562 05/13/13 1214      Assessment/Plan: Problem List: Patient Active Problem List   Diagnosis Date Noted  . RUQ abdominal pain 05/13/2013  . Acute cholecystitis: Probable 05/13/2013  . Constipation 05/13/2013  .  Enterococcus UTI 05/13/2013  . Leukocytosis 05/13/2013  . Anemia 05/13/2013  . Stroke 04/15/2012  . Acute left thalamic CVA (cerebral infarction) 04/10/2012  . Diabetes mellitus without complication   . Hypertension   . BPH (benign prostatic hyperplasia)     Improved after percutaneous drainage of necrotic gallbladder.   * No surgery found *    LOS: 6 days   Matt B. Daphine Deutscher, MD, [facility] [phone]  beeper [phone]  05/19/2013 12:09 PM

## 2013-05-19 NOTE — Progress Notes (Signed)
Occupational Therapy Treatment Patient Details Name: Mike Mack MRN: [id number] DOB: April 14, 1936 Today's Date: 05/19/2013 Time: 1191-4782 OT Time Calculation (min): 15 min  OT Assessment / Plan / Recommendation  History of present illness With history of hypertension, diabetes mellitus, prior history of small bowel obstruction, BPH, history of stroke presented to the ED with 3 to four-day history of right upper quadrant pain, abdominal distention, chronic constipation, generalized weakness, nausea vomiting. Patient was seen in the ED and noted to be febrile on a 1.3. Patient also with complaints of right flank pain. Pt is now s/p percutaneous cholecystostomy/ Pt has h/o polio affecting R LE.   OT comments  Tolerated up for functional transfer better today. Also worked on [employer] self care. Planning SNF. Pt states he feels overall "weak."   Follow Up Recommendations  SNF;Supervision/Assistance - 24 hour    Barriers to Discharge       Equipment Recommendations  3 in 1 bedside comode    Recommendations for Other Services    Frequency Min 2X/week   Progress towards OT Goals Progress towards OT goals: Progressing toward goals  Plan      Precautions / Restrictions Precautions Precautions: Fall Precaution Comments: post polio syndrome affecting RLE Required Braces or Orthoses: Other Brace/Splint Other Brace/Splint: pt states he has a R foot/leg brace but not at the hospital currently. Restrictions Weight Bearing Restrictions: No   Pertinent Vitals/Pain 4/10 at rest 5/10 with activity; reposition, pt states had pain meds recent    ADL  Lower Body Dressing: Performed;Set up (socks at EOB) Where Assessed - Lower Body Dressing: Unsupported sitting Toilet Transfer: Simulated;Minimal assistance Toilet Transfer Method: Stand pivot    OT Diagnosis:    OT Problem List:   OT Treatment Interventions:     OT Goals(current goals can now be found in the care plan section)    Visit  Information  Last OT Received On: 05/19/13 Assistance Needed: +1 (pivot to chair) History of Present Illness: With history of hypertension, diabetes mellitus, prior history of small bowel obstruction, BPH, history of stroke presented to the ED with 3 to four-day history of right upper quadrant pain, abdominal distention, chronic constipation, generalized weakness, nausea vomiting. Patient was seen in the ED and noted to be febrile on a 1.3. Patient also with complaints of right flank pain. Pt is now s/p percutaneous cholecystostomy/ Pt has h/o polio affecting R LE.    Subjective Data      Prior Functioning       Cognition  Cognition Arousal/Alertness: Awake/alert Behavior During Therapy: WFL for tasks assessed/performed Overall Cognitive Status: Within Functional Limits for tasks assessed    Mobility  Bed Mobility Rolling Left: 5: Supervision;With rail Left Sidelying to Sit: 5: Supervision Transfers Transfers: Sit to Stand;Stand to Sit Sit to Stand: 4: Min assist;With upper extremity assist;From bed Stand to Sit: 4: Min assist;With upper extremity assist;To chair/3-in-1 Details for Transfer Assistance: min verbal cues for hand placement    Exercises      Balance Static Sitting Balance Static Sitting - Level of Assistance: 5: Stand by assistance   End of Session OT - End of Session Equipment Utilized During Treatment: Rolling walker Activity Tolerance: Patient tolerated treatment well Patient left: in chair;with call bell/phone within reach  GO     Lennox Laity [phone] 05/19/2013, 9:31 AM

## 2013-05-19 NOTE — Progress Notes (Signed)
Inpatient Diabetes Program Recommendations  AACE/ADA: New Consensus Statement on Inpatient Glycemic Control (2013)  Target Ranges:  Prepandial:   less than 140 mg/dL      Peak postprandial:   less than 180 mg/dL (1-2 hours)      Critically ill patients:  140 - 180 mg/dL   Reason for Visit: Hypoglycemia  Results for Mike Mack, Mike Mack (MRN [id number]) as of 05/19/2013 11:39  Ref. Range 05/18/2013 07:55 05/18/2013 08:16 05/18/2013 09:30 05/18/2013 11:59 05/18/2013 16:59 05/18/2013 21:01 05/19/2013 02:33 05/19/2013 07:26 05/19/2013 09:00  Glucose-Capillary Latest Range: 70-99 mg/dL 38 (LL) 65 (L) 324 (H) 151 (H) 156 (H) 179 (H) 104 (H) 66 (L) 163 (H)     Inpatient Diabetes Program Recommendations Insulin - Basal: Decrease Levemir to 20 units bid Correction (SSI): Decrease Novolog to moderate tidwc and hs  Note: Will continue to follow while inpatient.  Thank you. Ailene Ards, RD, LDN,  CDE Inpatient Diabetes Coordinator [phone]

## 2013-05-19 NOTE — Discharge Summary (Signed)
Physician Discharge Summary  Mike Mack ZOX:[id number] DOB: June 19, 1936 DOA: 05/13/2013  PCP: Michiel Sites, MD  Admit date: 05/13/2013 Discharge date: 05/19/2013  Recommendations for Outpatient Follow-up:  1. Patient had percutaneous biliary drainage on 05/13/2013 and he needs it for at least 6-8 weeks from the time of the insertion 2. Patient needs to be on Augmentin and doxycycline twice a day for 2 weeks on discharge. 3. Please continue Colace twice daily and MiraLAX 3 times a day in addition to enemas daily as needed for constipation. 4. Patient is usually on Levemir 50 units twice daily but do to decreased by mouth intake we decreased Levemir to 25 units twice daily. Levemir can be further decreased depending on CBGs. 5. Kidney function is now within normal limits prior to discharge. 6. Hemoglobin remained stable at 9.5 for past 2 days prior to discharge. 7. Potassium has been within normal limits.  Discharge Diagnoses:  Principal Problem:   Acute cholecystitis: Probable Active Problems:   RUQ abdominal pain   Diabetes mellitus without complication   Hypertension   BPH (benign prostatic hyperplasia)   Stroke   Constipation   Enterococcus UTI   Leukocytosis   Anemia    Discharge Condition: Medically stable for discharge to skilled nursing facility today  Diet recommendation: As tolerated  History of present illness:  77 year old male with past medical history of DM, small bowel obstruction, BPH, recent enterococcal UTI who presented to [facility] ED 05/13/2013 with ongoing RUQ abdominal pain, N/V, constipation and generalized weakness for past few days prior to this admission. In ED, patient was noted to be febrile with Tmax of 101.28F. CBC revealed WBC count of 18.9, hemoglobin of 10.6 and normal platelet count. BMP revealed sodium of 132 and creatinine of 1.45. Lipase level was 9 on this admission. Normal LFT's. Acute abdominal series were significant for constipation.  Abd US revealed distended gallbladder but no pericholecystic fluid evident. CT abdomen revealed a large amount of stool present within the colon consistent with severe constipation; distention of the gallbladder with mild gallbladder wall thickening which may reflect development of acalculus cholecystitis; possible early pneumonia in the lower lobes posteriorly. Patient had biliary drain placed by IR with no subsequent complications. He was started on broad spectrum antibiotics on the admission.  Hospital course complicated with ongoing abdominal distention and constipation which has improved now with extensive bowel regimen including frequent enemas.   Assessment/Plan:   Principal Problem:  Acute cholecystitis  - status post percutaneous biliary drain placement 05/13/2013 by IR; per surgery patient will need biliary drainage for 6-8 weeks from the time of the drainage placement - biliary fluid culture - final result grew MRSA so pt was on vancomycin in addition to Zosyn. I spoke with infectious disease on call and recommendation was to switch to by mouth Augmentin and doxycycline twice a day for 2 weeks on discharge - blood cultures negative to date  - appreciated IR and surgery following  Active Problems:  Severe constipation, abdominal distention  - colace BID and miralax 3 times a day, enema PRN daily - had multiple BM over past 72 hours Diabetes mellitus without complication, uncontrolled  - A1c 7.8 on this admission  - in the setting of an acute infection and renal failure continue to hold metformin  - May continue Levemir and we have decreased it to 25 units twice a day Hypertension  - continue norvasc 5 mg daily, avapro 300 mg at bedtime  BPH (benign prostatic hyperplasia)  -  continue Flomax 0.4 mg daily  Enterococcus UTI  - urine culture grew enterococcus on 05/10/2013 - Patient will be discharged with Augmentin and doxycycline. These antibiotics should cover for enterococcal  UTI Leukocytosis  - due to acute cholecystitis and/or enterococcal UTI  - Is on zosyn and vancomycin and will be on doxycycline and Augmentin for 2 weeks and is to - WBC count trending down from 18 to 12 --> WNL  Acute renal failure  - likely prerenal, dehydration or metformin, vancomycin  - vanco and metformin are all discontinued  - creatinine as high as 1.71 but now slowly trending down to normal limit Anemia of chronic disease  - related to history of diabetes  - hemoglobin 9.6  - no indications for transfusion  H/O CVA  - on aspirin for secondary stroke prevention   Code Status: full code  Family Communication: pt wife at the bedside   Consultants:  Surgery  Interventional radiology Procedures:  Percutaneous cholecystostomy 05/13/2013 by IR Antibiotics:  Vancomycin 05/13/2013 --> 05/14/2013; restarted 05/16/2013 --> 05/19/2013 Zosyn 05/13/2013 --> 05/19/2013    Signed:  Manson Passey, MD  [facility] 05/19/2013, 11:45 AM  Pager #: [phone]   Discharge Exam: Filed Vitals:   05/19/13 0500  BP: 142/80  Pulse: 73  Temp: 98.7 F (37.1 C)  Resp: 16   Filed Vitals:   05/17/13 2127 05/18/13 1449 05/18/13 2141 05/19/13 0500  BP: 152/72 130/69 117/64 142/80  Pulse: 79 75 70 73  Temp: 98.8 F (37.1 C) 98.6 F (37 C) 99 F (37.2 C) 98.7 F (37.1 C)  TempSrc: Oral Oral Oral Oral  Resp: 18 16 16 16   Height:      Weight:      SpO2: 97% 99% 100% 100%    General: Pt is alert, follows commands appropriately, not in acute distress Cardiovascular: Regular rate and rhythm, S1/S2 appreciated Respiratory: Clear to auscultation bilaterally, no wheezing, no crackles, no rhonchi Abdominal: abd less distended, perc drain in place with bloody drainage  Extremities: no edema, no cyanosis, pulses palpable bilaterally DP and PT Neuro: Grossly nonfocal  Discharge Instructions  Discharge Orders   Future Appointments Provider Department Dept Phone   06/24/2013 8:45  AM Ernestene Mention, MD [facility], [place] [phone]   Future Orders Complete By Expires   Call MD for:  difficulty breathing, headache or visual disturbances  As directed    Call MD for:  persistant dizziness or light-headedness  As directed    Call MD for:  persistant nausea and vomiting  As directed    Call MD for:  severe uncontrolled pain  As directed    Diet - low sodium heart healthy  As directed    Discharge instructions  As directed    Comments:     1. Patient had percutaneous biliary drainage on 05/13/2013 and he needs it for at least 6-8 weeks from the time of the insertion 2. Patient needs to be on Augmentin and doxycycline twice a day for 2 weeks on discharge. 3. Please continue Colace twice daily and MiraLAX 3 times a day in addition to enemas daily as needed for constipation. 4. Patient is usually on Levemir 50 units twice daily but do to decreased by mouth intake we decreased Levemir to 25 units twice daily. Levemir can be further decreased depending on CBGs. 5. Kidney function is now within normal limits prior to discharge. 6. Hemoglobin remained stable at 9.5 for past 2 days prior to discharge. 7. Potassium has  been within normal limits.   Increase activity slowly  As directed        Medication List    STOP taking these medications       metFORMIN 1000 MG (MOD) 24 hr tablet  Commonly known as:  GLUMETZA      TAKE these medications       acetaminophen 325 MG tablet  Commonly known as:  TYLENOL  Take 2 tablets (650 mg total) by mouth every 6 (six) hours as needed for mild pain, fever or headache (or Fever >/= 101).     albuterol (5 MG/ML) 0.5% nebulizer solution  Commonly known as:  PROVENTIL  Take 0.5 mLs (2.5 mg total) by nebulization every 6 (six) hours as needed for wheezing or shortness of breath.     alum & mag hydroxide-simeth 200-200-20 MG/5ML suspension  Commonly known as:  MAALOX/MYLANTA  Take 30 mLs by mouth every 6 (six) hours as needed  for indigestion or heartburn (dyspepsia).     amLODipine 5 MG tablet  Commonly known as:  NORVASC  Take 5 mg by mouth every morning.     amoxicillin-clavulanate 875-125 MG per tablet  Commonly known as:  AUGMENTIN  Take 1 tablet by mouth 2 (two) times daily.     aspirin 325 MG tablet  Take 1 tablet (325 mg total) by mouth daily.     atorvastatin 20 MG tablet  Commonly known as:  LIPITOR  Take 1 tablet by mouth every evening.     doxycycline 100 MG tablet  Commonly known as:  VIBRA-TABS  Take 1 tablet (100 mg total) by mouth 2 (two) times daily.     DSS 100 MG Caps  Take 100 mg by mouth 2 (two) times daily.     gabapentin 800 MG tablet  Commonly known as:  NEURONTIN  Take 1 tablet by mouth 3 (three) times daily as needed (HEADACHES).     guaiFENesin 100 MG/5ML Soln  Commonly known as:  ROBITUSSIN  Take 10 mLs (200 mg total) by mouth every 4 (four) hours as needed.     insulin detemir 100 UNIT/ML injection  Commonly known as:  LEVEMIR  Inject 0.25 mLs (25 Units total) into the skin 2 (two) times daily.     irbesartan 300 MG tablet  Commonly known as:  AVAPRO  Take 300 mg by mouth at bedtime.     mineral oil liquid  Take 15 mLs by mouth daily as needed for moderate constipation.     morphine 15 MG tablet  Commonly known as:  MSIR  Take 1 tablet (15 mg total) by mouth every 4 (four) hours as needed for severe pain.     ondansetron 4 MG tablet  Commonly known as:  ZOFRAN  Take 1 tablet (4 mg total) by mouth every 6 (six) hours as needed for nausea.     polyethylene glycol packet  Commonly known as:  MIRALAX / GLYCOLAX  Take 17 g by mouth 3 (three) times daily.     predniSONE 1 MG tablet  Commonly known as:  DELTASONE  Take 6 mg by mouth every morning. Take with 5 mg as directed. Total daily dose 6mg      sodium phosphate 7-19 GM/118ML Enem  Place 1 enema rectally daily as needed for severe constipation.     solifenacin 10 MG tablet  Commonly known as:   VESICARE  Take 10 mg by mouth every evening.     tamsulosin 0.4 MG Caps capsule  Commonly  known as:  FLOMAX  Take 0.4 mg by mouth 2 (two) times daily.     traMADol 50 MG tablet  Commonly known as:  ULTRAM  Take 1 tablet (50 mg total) by mouth every 6 (six) hours as needed.     Travoprost (BAK Free) 0.004 % Soln ophthalmic solution  Commonly known as:  TRAVATAN  Place 1 drop into both eyes at bedtime.           Follow-up Information   Follow up with Ernestene Mention, MD. Schedule an appointment as soon as possible for a visit on 06/24/2013. (Your appointment is at 8:45, be there 15 minutes early for check-in.)    Specialty:  General Surgery   Contact information:   [address] [place] [address] [phone]        The results of significant diagnostics from this hospitalization (including imaging, microbiology, ancillary and laboratory) are listed below for reference.    Significant Diagnostic Studies: Ct Abdomen Pelvis W Contrast 05/13/2013    IMPRESSION: 1. A large amount of stool is present within the colon as described. This pattern is similar to that seen on the CT scan of May 10, 2013 and is consistent with severe constipation. No annular constricting lesion or other obstructive process is demonstrated. There are no bowel wall changes to suggest colitis or diverticulitis. Distention of distal small bowel loops has developed since the earlier study. 2. Distention of the gallbladder is more prominent today and there is mild gallbladder wall thickening which may reflect development of acalculus cholecystitis. 3. There is new bibasilar atelectasis versus early pneumonia in the lower lobes posteriorly. Small bilateral pleural effusions have developed as well. 4. There is no evidence of urinary tract obstruction or acute inflammation. Renal cystic lesions are present bilaterally. Prostatic enlargement is again demonstrated.     Ct Abdomen Pelvis W  Contrast 05/10/2013    IMPRESSION: 1. No findings to explain left flank pain. 2. Nonobstructive right nephrolithiasis. 3. Trace bilateral pleural effusions.     US Abdomen Limited 05/13/2013     IMPRESSION: 1. The gallbladder is distended and exhibits wall thickening with multiple tiny stones as well as echogenic bile. There is no positive sonographic Murphy's sign or definite pericholecystic fluid. 2. No acute abnormality of the liver or common bile duct is demonstrated. The pancreas is obscured by bowel gas. 3. There are simple appearing cysts in both kidneys. There is no hydronephrosis.    Ir Perc Cholecystostomy 05/13/2013     IMPRESSION: Successful cholecystostomy tube placement with ultrasound and fluoroscopic guidance. Sample of the fluid was sent for Gram stain and culture.    Dg Abd 2 Views 05/16/2013   IMPRESSION: 1. Worsening generalized abdominal ileus. 2. No evidence of bowel obstruction or free intraperitoneal air. 3. Very large stool burden throughout the colon. 4. Cholecystostomy tube in the right upper quadrant.     Dg Abd Acute W/chest 05/13/2013   IMPRESSION: 1. The bowel gas pattern is consistent with constipation. Minimal distention of small bowel loops with gas is demonstrated as well. 2. The chest film reveals a small left pleural effusion and mild prominence of the pulmonary interstitium and pulmonary vascularity suggesting low-grade CHF.     Microbiology: Recent Results (from the past 240 hour(s))  URINE CULTURE     Status: None   Collection Time    05/10/13  1:27 AM      Result Value Range Status   Specimen Description URINE, RANDOM  Final   Special Requests NONE   Final   Culture  Setup Time     Final   Value: 05/10/2013 14:29     Performed at [facility] Count     Final   Value: >=100,000 COLONIES/ML     Performed at [facility]   Culture     Final   Value: ENTEROCOCCUS SPECIES     Performed at [facility]   Report  Status 05/12/2013 FINAL   Final   Organism ID, Bacteria ENTEROCOCCUS SPECIES   Final  CULTURE, ROUTINE-ABSCESS     Status: None   Collection Time    05/13/13  5:47 PM      Result Value Range Status   Specimen Description OTHER   Final   Special Requests NONE   Final   Gram Stain     Final   Value: NO WBC SEEN     NO SQUAMOUS EPITHELIAL CELLS SEEN     NO ORGANISMS SEEN     Performed at [facility]   Culture     Final   Value: RARE METHICILLIN RESISTANT STAPHYLOCOCCUS AUREUS     Note: RIFAMPIN AND GENTAMICIN SHOULD NOT BE USED AS SINGLE DRUGS FOR TREATMENT OF STAPH INFECTIONS. CRITICAL RESULT CALLED TO, READ BACK BY AND VERIFIED WITH: MATTHEW WRIGHT 05/16/13 1045 BY SMITHERSJ     Performed at [facility]   Report Status 05/16/2013 FINAL   Final   Organism ID, Bacteria METHICILLIN RESISTANT STAPHYLOCOCCUS AUREUS   Final  CULTURE, BLOOD (ROUTINE X 2)     Status: None   Collection Time    05/13/13  6:00 PM      Result Value Range Status   Specimen Description BLOOD LEFT HAND   Final   Special Requests BOTTLES DRAWN AEROBIC AND ANAEROBIC 10CC   Final   Culture  Setup Time     Final   Value: 05/13/2013 22:00     Performed at [facility]   Culture     Final   Value: NO GROWTH 5 DAYS     Performed at [facility]   Report Status 05/19/2013 FINAL   Final  BODY FLUID CULTURE     Status: None   Collection Time    05/13/13  6:12 PM      Result Value Range Status   Specimen Description COMMON BILE DUCT   Final   Special Requests NONE   Final   Gram Stain     Final   Value: NO WBC SEEN     NO ORGANISMS SEEN     Performed at [facility]   Culture     Final   Value: NO GROWTH 3 DAYS     Performed at [facility]   Report Status 05/17/2013 FINAL   Final  CULTURE, BLOOD (ROUTINE X 2)     Status: None   Collection Time    05/13/13  6:15 PM      Result Value Range Status   Specimen Description BLOOD RIGHT ARM   Final   Special Requests  BOTTLES DRAWN AEROBIC AND ANAEROBIC 10CC   Final   Culture  Setup Time     Final   Value: 05/13/2013 22:00     Performed at [facility]   Culture     Final   Value: NO GROWTH 5 DAYS     Performed at [facility]   Report Status  05/19/2013 FINAL   Final     Labs: Basic Metabolic Panel:  Recent Labs Lab 05/13/13 0935 05/14/13 0340 05/15/13 0755 05/16/13 0930 05/17/13 0405 05/19/13 0424  NA 132* 132* 127* 132* 136 136  K 4.2 4.6 4.4 4.4 4.5 3.9  CL 95* 98 94* 97 103 107  CO2 26 22 22 25 25 20   GLUCOSE 62* 155* 182* 158* 90 92  BUN 22 30* 38* 33* 29* 9  CREATININE 1.45* 1.61* 1.71* 1.59* 1.52* 1.26  CALCIUM 9.5 8.5 8.4 8.6 8.2* 8.2*  MG 2.6*  --   --   --   --   --    Liver Function Tests:  Recent Labs Lab 05/13/13 0935 05/14/13 0340 05/15/13 0755 05/17/13 0405  AST 26 48* 46* 25  ALT 16 32 34 27  ALKPHOS 87 86 84 72  BILITOT 0.9 1.2 0.8 0.5  PROT 7.5 6.6 6.6 6.1  ALBUMIN 3.3* 2.7* 2.5* 2.4*    Recent Labs Lab 05/13/13 0935  LIPASE 9*   No results found for this basename: AMMONIA,  in the last 168 hours CBC:  Recent Labs Lab 05/13/13 0935 05/14/13 0340 05/15/13 0755 05/16/13 1616 05/17/13 0405 05/19/13 0424  WBC 18.9* 18.0* 12.1* 10.6* 8.0 9.9  NEUTROABS 16.8*  --   --   --   --   --   HGB 10.6* 9.7* 9.7* 10.4* 9.6* 9.5*  HCT 31.9* 29.0* 28.1* 30.2* 28.4* 28.0*  MCV 89.4 88.7 87.3 87.3 87.9 87.5  PLT 236 226 264 323 328 407*   Cardiac Enzymes: No results found for this basename: CKTOTAL, CKMB, CKMBINDEX, TROPONINI,  in the last 168 hours BNP: BNP (last 3 results) No results found for this basename: PROBNP,  in the last 8760 hours CBG:  Recent Labs Lab 05/18/13 1659 05/18/13 2101 05/19/13 0233 05/19/13 0726 05/19/13 0900  GLUCAP 156* 179* 104* 66* 163*    Time coordinating discharge: Over 30 minutes

## 2013-05-19 NOTE — Progress Notes (Signed)
Physical Therapy Treatment Patient Details Name: Mike Mack MRN: [id number] DOB: 09-18-1935 Today's Date: 05/19/2013 Time: 8657-8469 PT Time Calculation (min): 14 min  PT Assessment / Plan / Recommendation  History of Present Illness With history of hypertension, diabetes mellitus, prior history of small bowel obstruction, BPH, history of stroke presented to the ED with 3 to four-day history of right upper quadrant pain, abdominal distention, chronic constipation, generalized weakness, nausea vomiting. Patient was seen in the ED and noted to be febrile on a 1.3. Patient also with complaints of right flank pain. Pt is now s/p percutaneous cholecystostomy/ Pt has h/o polio affecting R LE.   PT Comments   **Pt is progressing well wi th mobility, he walked 34' today with RW. REady to DC to SNF from PT standpoint.*  Follow Up Recommendations  SNF     Does the patient have the potential to tolerate intense rehabilitation     Barriers to Discharge        Equipment Recommendations  None recommended by PT    Recommendations for Other Services    Frequency Min 3X/week   Progress towards PT Goals Progress towards PT goals: Progressing toward goals  Plan Current plan remains appropriate    Precautions / Restrictions Precautions Precautions: Fall Precaution Comments: post polio syndrome affecting RLE; h/o 2 falls this year Required Braces or Orthoses: Other Brace/Splint Other Brace/Splint: pt states he has a R foot/leg brace but not at the hospital currently. Restrictions Weight Bearing Restrictions: No   Pertinent Vitals/Pain **0/10*    Mobility  Bed Mobility Bed Mobility: Sit to Supine Rolling Left: 5: Supervision;With rail Left Sidelying to Sit: 5: Supervision Sit to Supine: 4: Min assist Details for Bed Mobility Assistance: min A for LEs into bed Transfers Sit to Stand: With upper extremity assist;4: Min guard;From chair/3-in-1 Stand to Sit: With upper extremity assist;To  bed;5: Supervision Details for Transfer Assistance: min verbal cues for hand placement Ambulation/Gait Ambulation/Gait Assistance: 5: Supervision Ambulation Distance (Feet): 85 Feet Assistive device: Rolling walker Gait Pattern: Step-through pattern;Lateral trunk lean to left Gait velocity: decreased General Gait Details: supervision for safety 2* h/o falls    Exercises     PT Diagnosis:    PT Problem List:   PT Treatment Interventions:     PT Goals (current goals can now be found in the care plan section) Acute Rehab PT Goals Patient Stated Goal: get stronger PT Goal Formulation: With patient Time For Goal Achievement: 05/28/13 Potential to Achieve Goals: Good  Visit Information  Last PT Received On: 05/19/13 Assistance Needed: +1 History of Present Illness: With history of hypertension, diabetes mellitus, prior history of small bowel obstruction, BPH, history of stroke presented to the ED with 3 to four-day history of right upper quadrant pain, abdominal distention, chronic constipation, generalized weakness, nausea vomiting. Patient was seen in the ED and noted to be febrile on a 1.3. Patient also with complaints of right flank pain. Pt is now s/p percutaneous cholecystostomy/ Pt has h/o polio affecting R LE.    Subjective Data  Patient Stated Goal: get stronger   Cognition  Cognition Arousal/Alertness: Awake/alert Behavior During Therapy: WFL for tasks assessed/performed Overall Cognitive Status: Within Functional Limits for tasks assessed    Balance  Static Sitting Balance Static Sitting - Level of Assistance: 5: Stand by assistance  End of Session PT - End of Session Equipment Utilized During Treatment: Gait belt Activity Tolerance: Patient tolerated treatment well Patient left: in bed;with call bell/phone within reach;with family/visitor present  Nurse Communication: Mobility status   GP     Ralene Bathe Kistler 05/19/2013, 11:20 AM [phone]

## 2013-05-19 NOTE — Progress Notes (Signed)
ANTIBIOTIC CONSULT NOTE - Follow Up  Pharmacy Consult for Vancomycin Indication: MRSA in bile drainage  Labs: vanc trough 8.2  A/P: Vanc trough was subtherapeutic (goal 15-20) on vanc dose of 1g q24. Patient to be discharged today on PO doxy and Augmentin x 2 more weeks. If vanc had been continued, I would have changed the dose to 750mg  IV q12.   Hessie Knows, PharmD, BCPS Pager [phone] 05/19/2013 1:05 PM

## 2013-05-19 NOTE — Progress Notes (Signed)
CSW met with pt and pt wife at bedside.  Pt and pt wife are agreeable to [facility] for SNF placement.  CSW facilitated pt discharge needs including contacting facility, faxing pt discharge information via [facility], discussing with pt at bedside, providing RN phone number to call report, and arranging ambulance transport for pt to [facility].  No further social work needs identified at this time.  CSW signing off.   Jacklynn Lewis, MSW, LCSWA  Clinical Social Work [phone]

## 2013-05-23 ENCOUNTER — Non-Acute Institutional Stay (SKILLED_NURSING_FACILITY): Payer: Medicare Other | Admitting: Internal Medicine

## 2013-05-23 DIAGNOSIS — B952 Enterococcus as the cause of diseases classified elsewhere: Secondary | ICD-10-CM

## 2013-05-23 DIAGNOSIS — I639 Cerebral infarction, unspecified: Secondary | ICD-10-CM

## 2013-05-23 DIAGNOSIS — I10 Essential (primary) hypertension: Secondary | ICD-10-CM

## 2013-05-23 DIAGNOSIS — E119 Type 2 diabetes mellitus without complications: Secondary | ICD-10-CM

## 2013-05-23 DIAGNOSIS — I6350 Cerebral infarction due to unspecified occlusion or stenosis of unspecified cerebral artery: Secondary | ICD-10-CM

## 2013-05-23 DIAGNOSIS — K810 Acute cholecystitis: Secondary | ICD-10-CM

## 2013-05-23 DIAGNOSIS — R5381 Other malaise: Secondary | ICD-10-CM

## 2013-05-23 DIAGNOSIS — K5900 Constipation, unspecified: Secondary | ICD-10-CM

## 2013-05-23 DIAGNOSIS — N390 Urinary tract infection, site not specified: Secondary | ICD-10-CM

## 2013-05-23 DIAGNOSIS — R531 Weakness: Secondary | ICD-10-CM

## 2013-05-23 NOTE — Progress Notes (Signed)
Patient ID: Mike Mack, male   DOB: [birth year], 77 y.o.   MRN: [id number]  [name] living [place]    PCP: Michiel Sites, MD  Code Status: full code  No Known Allergies  Chief Complaint: new admit  HPI:  77 y/o male patient was admitted to the hospital on 05/13/13 with RUQ abdominal pain and was noted to have acute cholecystitis. He had IR guided biliary drain placement and was started on broad spectrum antibiotics.he then had abdominal distension and constipation which improved with extensive bowel regimen. He is here for STR and goal is for patient to return home. He was seen in his room today. He denies any complaints this visit. No concerns from staff. He has been working with therapy team  Review of Systems  Constitutional: Negative for fever, chills, weight loss, malaise/fatigue and diaphoresis.  HENT: Negative for congestion, hearing loss and sore throat.   Eyes: Negative for blurred vision, double vision and discharge.  Respiratory: Negative for cough, sputum production, shortness of breath and wheezing.   Cardiovascular: Negative for chest pain, palpitations, orthopnea and leg swelling.  Gastrointestinal: Negative for heartburn, nausea, vomiting, abdominal pain, diarrhea and constipation.  Genitourinary: Negative for dysuria, urgency, frequency and flank pain.  Musculoskeletal: Negative for back pain, falls, joint pain and myalgias.  Skin: Negative for itching and rash.  Neurological: Positive for weakness. Negative for dizziness, tingling, focal weakness and headaches.  Psychiatric/Behavioral: Negative for depression and memory loss. The patient is not nervous/anxious.     Past Medical History  Diagnosis Date  . Diabetes mellitus without complication   . Hypertension   . Small bowel obstruction   . BPH (benign prostatic hyperplasia)   . Neuropathy   . Stroke   . Polio   . Polio Childhood  . Arthritis   . Constipation 05/13/2013  . Anemia 05/13/2013    Past Surgical History  Procedure Laterality Date  . Bowel resection    . Hernia repair    . Cataract extraction w/ intraocular lens  implant, bilateral     Social History:   reports that he has never smoked. He has never used smokeless tobacco. He reports that he does not drink alcohol or use illicit drugs.  Family History  Problem Relation Age of Onset  . Diabetes Sister   . Diabetes Sister   . Diabetes Brother   . Hypertension Sister   . Hypertension Sister   . Hypertension Brother   . Heart failure Mother   . Heart attack Sister     Medications: Patient's Medications  New Prescriptions   No medications on file  Previous Medications   ACETAMINOPHEN (TYLENOL) 325 MG TABLET    Take 2 tablets (650 mg total) by mouth every 6 (six) hours as needed for mild pain, fever or headache (or Fever >/= 101).   ALBUTEROL (PROVENTIL) (5 MG/ML) 0.5% NEBULIZER SOLUTION    Take 0.5 mLs (2.5 mg total) by nebulization every 6 (six) hours as needed for wheezing or shortness of breath.   ALUM & MAG HYDROXIDE-SIMETH (MAALOX/MYLANTA) 200-200-20 MG/5ML SUSPENSION    Take 30 mLs by mouth every 6 (six) hours as needed for indigestion or heartburn (dyspepsia).   AMLODIPINE (NORVASC) 5 MG TABLET    Take 5 mg by mouth every morning.    AMOXICILLIN-CLAVULANATE (AUGMENTIN) 875-125 MG PER TABLET    Take 1 tablet by mouth 2 (two) times daily.   ASPIRIN 325 MG TABLET    Take 1 tablet (325 mg total) by mouth daily.  ATORVASTATIN (LIPITOR) 20 MG TABLET    Take 1 tablet by mouth every evening.   DOCUSATE SODIUM 100 MG CAPS    Take 100 mg by mouth 2 (two) times daily.   DOXYCYCLINE (VIBRA-TABS) 100 MG TABLET    Take 1 tablet (100 mg total) by mouth 2 (two) times daily.   GABAPENTIN (NEURONTIN) 800 MG TABLET    Take 1 tablet by mouth 3 (three) times daily as needed (HEADACHES).    GUAIFENESIN (ROBITUSSIN) 100 MG/5ML SOLN    Take 10 mLs (200 mg total) by mouth every 4 (four) hours as needed.   INSULIN DETEMIR  (LEVEMIR) 100 UNIT/ML INJECTION    Inject 0.25 mLs (25 Units total) into the skin 2 (two) times daily.   IRBESARTAN (AVAPRO) 300 MG TABLET    Take 300 mg by mouth at bedtime.   MINERAL OIL LIQUID    Take 15 mLs by mouth daily as needed for moderate constipation.   MORPHINE (MSIR) 15 MG TABLET    Take 1 tablet (15 mg total) by mouth every 4 (four) hours as needed for severe pain.   ONDANSETRON (ZOFRAN) 4 MG TABLET    Take 1 tablet (4 mg total) by mouth every 6 (six) hours as needed for nausea.   POLYETHYLENE GLYCOL (MIRALAX / GLYCOLAX) PACKET    Take 17 g by mouth 3 (three) times daily.   PREDNISONE (DELTASONE) 1 MG TABLET    Take 6 mg by mouth every morning. Take with 5 mg as directed. Total daily dose 6mg    SODIUM PHOSPHATE (FLEET) 7-19 GM/118ML ENEM    Place 1 enema rectally daily as needed for severe constipation.   SOLIFENACIN (VESICARE) 10 MG TABLET    Take 10 mg by mouth every evening.   TAMSULOSIN HCL (FLOMAX) 0.4 MG CAPS    Take 0.4 mg by mouth 2 (two) times daily.    TRAMADOL (ULTRAM) 50 MG TABLET    Take 1 tablet (50 mg total) by mouth every 6 (six) hours as needed.   TRAVOPROST, BAK FREE, (TRAVATAN) 0.004 % SOLN OPHTHALMIC SOLUTION    Place 1 drop into both eyes at bedtime.  Modified Medications   No medications on file  Discontinued Medications   No medications on file     Physical Exam:  Filed Vitals:   05/23/13 1036  BP: 129/63  Pulse: 69  Temp: 97.8 F (36.6 C)  Resp: 20  SpO2: 96%   General- elderly male in no acute distress Head- atraumatic, normocephalic Eyes- PERRLA, EOMI, no pallor, no icterus Neck- no lymphadenopathy, no thyromegaly Chest- no chest wall deformities, no chest wall tenderness Cardiovascular- normal s1,s2, no murmurs/ rubs/ gallops Respiratory- bilateral clear to auscultation, no wheeze, no rhonchi, no crackles Abdomen- bowel sounds present, soft, non tender, distension, no guarding or rigidity, no CVA tenderness, has drain in place and site  clean Musculoskeletal- able to move all 4 extremities, no spinal and paraspinal tenderness, using a walker with therapy Neurological- no focal deficit Psychiatry- alert and oriented to person, place and time, normal mood and affect    Labs reviewed: Basic Metabolic Panel:  Recent Labs  16/10/96 0935  05/16/13 0930 05/17/13 0405 05/19/13 0424  NA 132*  < > 132* 136 136  K 4.2  < > 4.4 4.5 3.9  CL 95*  < > 97 103 107  CO2 26  < > 25 25 20   GLUCOSE 62*  < > 158* 90 92  BUN 22  < > 33* 29*  9  CREATININE 1.45*  < > 1.59* 1.52* 1.26  CALCIUM 9.5  < > 8.6 8.2* 8.2*  MG 2.6*  --   --   --   --   < > = values in this interval not displayed. Liver Function Tests:  Recent Labs  05/14/13 0340 05/15/13 0755 05/17/13 0405  AST 48* 46* 25  ALT 32 34 27  ALKPHOS 86 84 72  BILITOT 1.2 0.8 0.5  PROT 6.6 6.6 6.1  ALBUMIN 2.7* 2.5* 2.4*    Recent Labs  05/13/13 0935  LIPASE 9*   No results found for this basename: AMMONIA,  in the last 8760 hours CBC:  Recent Labs  03/27/13 2038 05/10/13 0055 05/13/13 0935  05/16/13 1616 05/17/13 0405 05/19/13 0424  WBC 7.1 7.6 18.9*  < > 10.6* 8.0 9.9  NEUTROABS 4.4 5.4 16.8*  --   --   --   --   HGB 10.4* 11.5* 10.6*  < > 10.4* 9.6* 9.5*  HCT 31.5* 35.3* 31.9*  < > 30.2* 28.4* 28.0*  MCV 92.6 91.2 89.4  < > 87.3 87.9 87.5  PLT 253 247 236  < > 323 328 407*  < > = values in this interval not displayed.  CBG:  Recent Labs  05/19/13 0726 05/19/13 0900 05/19/13 1200  GLUCAP 66* 163* 150*    Radiological Exams:  Ct Abdomen Pelvis W Contrast 05/13/2013    IMPRESSION: 1. A large amount of stool is present within the colon as described. This pattern is similar to that seen on the CT scan of May 10, 2013 and is consistent with severe constipation. No annular constricting lesion or other obstructive process is demonstrated. There are no bowel wall changes to suggest colitis or diverticulitis. Distention of distal small bowel  loops has developed since the earlier study. 2. Distention of the gallbladder is more prominent today and there is mild gallbladder wall thickening which may reflect development of acalculus cholecystitis. 3. There is new bibasilar atelectasis versus early pneumonia in the lower lobes posteriorly. Small bilateral pleural effusions have developed as well. 4. There is no evidence of urinary tract obstruction or acute inflammation. Renal cystic lesions are present bilaterally. Prostatic enlargement is again demonstrated.     Ct Abdomen Pelvis W Contrast 05/10/2013    IMPRESSION: 1. No findings to explain left flank pain. 2. Nonobstructive right nephrolithiasis. 3. Trace bilateral pleural effusions.     US Abdomen Limited 05/13/2013     IMPRESSION: 1. The gallbladder is distended and exhibits wall thickening with multiple tiny stones as well as echogenic bile. There is no positive sonographic Murphy's sign or definite pericholecystic fluid. 2. No acute abnormality of the liver or common bile duct is demonstrated. The pancreas is obscured by bowel gas. 3. There are simple appearing cysts in both kidneys. There is no hydronephrosis.    Ir Perc Cholecystostomy 05/13/2013     IMPRESSION: Successful cholecystostomy tube placement with ultrasound and fluoroscopic guidance. Sample of the fluid was sent for Gram stain and culture.    Dg Abd 2 Views 05/16/2013   IMPRESSION: 1. Worsening generalized abdominal ileus. 2. No evidence of bowel obstruction or free intraperitoneal air. 3. Very large stool burden throughout the colon. 4. Cholecystostomy tube in the right upper quadrant.     Dg Abd Acute W/chest 05/13/2013   IMPRESSION: 1. The bowel gas pattern is consistent with constipation. Minimal distention of small bowel loops with gas is demonstrated as well. 2. The chest film reveals  a small left pleural effusion and mild prominence of the pulmonary interstitium and pulmonary vascularity suggesting low-grade CHF.     Assessment/Plan  Generalized weakness- in setting of acute cholecystitis, here for STR. Will have him continue to work with PT and OT for gait training and muscle strengthening. Continue prn morphine for severe pain and monitor bowel movement. Continue his current bowel regimen  Acute cholecystitis- has a drain in place and is draining bile. Needs to be in place for antoher 6 weeks. Skin care to be continue. Remains afebrile and denies abdominal pain. Continue prn pain med and monitor bowel movement. Monitor wbc and temp curve. Also to continue his doxycyline and augmentin for a total 2 weeks  Enterococcus UTI- to complete course of augmentin and doxycycline. Encourage hydration  Hypertension- bp remains under control. Continue amlodipine, avapro  Constipation- continue his bowel regimen- colace, miralax and mineral oil prn  Hyperlipidemia- will continue his statin  Dm type 2- monitor cbg, continue levemir 25 u bid. He had required higher dose when outpatient, thus monitor cbg closely and adjust insulin dosage as needed  Old cva- continue aspirin and statin with his bp meds  BPH- no urinary complaints, will continue his vesicare and flomax  Family/ staff Communication: reviewed care plan with patient and nursing supervisor  Goals of care: to return home after completion of STR   Labs/tests ordered- cbc, cmp

## 2013-05-24 DIAGNOSIS — K810 Acute cholecystitis: Secondary | ICD-10-CM | POA: Insufficient documentation

## 2013-05-24 DIAGNOSIS — R531 Weakness: Secondary | ICD-10-CM | POA: Insufficient documentation

## 2013-05-24 HISTORY — DX: Acute cholecystitis: K81.0

## 2013-05-27 ENCOUNTER — Other Ambulatory Visit: Payer: Self-pay | Admitting: *Deleted

## 2013-05-27 MED ORDER — TRAMADOL HCL 50 MG PO TABS: 50.0000 mg | ORAL_TABLET | Freq: Four times a day (QID) | ORAL | Status: DC | PRN

## 2013-06-06 ENCOUNTER — Non-Acute Institutional Stay (SKILLED_NURSING_FACILITY): Payer: Medicare Other | Admitting: Internal Medicine

## 2013-06-06 DIAGNOSIS — E119 Type 2 diabetes mellitus without complications: Secondary | ICD-10-CM

## 2013-06-06 DIAGNOSIS — I10 Essential (primary) hypertension: Secondary | ICD-10-CM

## 2013-06-06 DIAGNOSIS — R51 Headache: Secondary | ICD-10-CM

## 2013-06-06 DIAGNOSIS — K810 Acute cholecystitis: Secondary | ICD-10-CM

## 2013-06-06 DIAGNOSIS — M1990 Unspecified osteoarthritis, unspecified site: Secondary | ICD-10-CM | POA: Insufficient documentation

## 2013-06-06 DIAGNOSIS — I639 Cerebral infarction, unspecified: Secondary | ICD-10-CM

## 2013-06-06 DIAGNOSIS — I6350 Cerebral infarction due to unspecified occlusion or stenosis of unspecified cerebral artery: Secondary | ICD-10-CM

## 2013-06-06 DIAGNOSIS — R519 Headache, unspecified: Secondary | ICD-10-CM | POA: Insufficient documentation

## 2013-06-06 DIAGNOSIS — M129 Arthropathy, unspecified: Secondary | ICD-10-CM

## 2013-06-06 DIAGNOSIS — N400 Enlarged prostate without lower urinary tract symptoms: Secondary | ICD-10-CM

## 2013-06-06 NOTE — Progress Notes (Signed)
Patient ID: Mike Mack, male   DOB: 08-Jun-1936, 77 y.o.   MRN: [id number]    Mike Mack living [place]   PCP: Mike Sites, MD  Code Status: full code  No Known Allergies  Chief Complaint: discharge visit  HPI:   77 y/o male patient was admitted here for STR after hospital admission with acute cholecystitis. He had IR guided biliary drain placement and was started on broad spectrum antibiotics and has worked with PT and OT here and made improvement. He has completed his antibiotic course and is stable to be discharged home with home health services He was seen in his room today. Has regular bowel movement with his stool softener. Denies any pain  Review of Systems  Constitutional: Negative for fever, chills, weight loss, malaise/fatigue and diaphoresis.  HENT: Negative for congestion, hearing loss and sore throat.   Eyes: Negative for blurred vision, double vision and discharge.  Respiratory: Negative for cough, sputum production, shortness of breath and wheezing.   Cardiovascular: Negative for chest pain, palpitations, orthopnea and leg swelling.  Gastrointestinal: Negative for heartburn, nausea, vomiting, abdominal pain, diarrhea and constipation.  Genitourinary: Negative for dysuria, urgency, frequency and flank pain.  Musculoskeletal: Negative for back pain, falls, joint pain and myalgias.  Skin: Negative for itching and rash.  Neurological: Positive for weakness. Negative for dizziness, tingling, focal weakness and headaches.  Psychiatric/Behavioral: Negative for depression and memory loss. The patient is not nervous/anxious.    Past Medical History  Diagnosis Date  . Diabetes mellitus without complication   . Hypertension   . Small bowel obstruction   . BPH (benign prostatic hyperplasia)   . Neuropathy   . Stroke   . Polio   . Polio Childhood  . Arthritis   . Constipation 05/13/2013  . Anemia 05/13/2013    Medication reviewed. See [facility]  Physical exam BP  140/76  Pulse 68  Temp(Src) 97.6 F (36.4 C)  Resp 16  SpO2 96%  General- elderly male in no acute distress Head- atraumatic, normocephalic Eyes- PERRLA, EOMI, no pallor, no icterus Neck- no lymphadenopathy, no thyromegaly Chest- no chest wall deformities, no chest wall tenderness Cardiovascular- normal s1,s2, no murmurs/ rubs/ gallops Respiratory- bilateral clear to auscultation, no wheeze, no rhonchi, no crackles Abdomen- bowel sounds present, soft, non tender, distension, no guarding or rigidity, no CVA tenderness, has drain in place and site clean Musculoskeletal- able to move all 4 extremities, no spinal and paraspinal tenderness, using a walker with therapy Neurological- no focal deficit Psychiatry- alert and oriented to person, place and time, normal mood and affect   Assessment/Plan  77 y/o male patient is stable to be discharged home with PT and OT services. He will also need a 3 in 1 commode given his weakness. 30 days script of his medications, follow up appointment with PCP provided  Dm type 2- monitor cbg, continue levemir 25 u bid. He had required higher dose when outpatient, thus monitor cbg closely and adjust insulin dosage as needed by PCP  Hypertension- bp remains under control. Continue amlodipine 5 mg daily, avapro 300 mg daily  Hyperlipidemia- will continue his lipitor 20 mg daily  BPH- no urinary complaints, will continue his vesicare 10 mg daily and flomax 0.4 mg daily  Old cva- continue aspirin and statin with his bp meds  Cholecystitis- s/p drain placement and has it in place for total of 6 weeks completed his course of antibiotics. Tolerating po feed well. Has minimal requirement of pain meds. Will d/c morphine and  discharge him on 50 mg tramadol q12h prn for pain  Headache- continue gabapentin 800 mg q8h prn for headache.   Arthritis- Talked with patient's wife about indication for his prednisone and she mentions he is on it for his arthritis an plan  is to taper it. Will decrease this to 5 mg daily for now until seen further by his orthopedic  Family/ staff Communication: reviewed care plan with patient and nursing supervisor  Spent more than 30 minutes with patient

## 2013-06-24 ENCOUNTER — Encounter (INDEPENDENT_AMBULATORY_CARE_PROVIDER_SITE_OTHER): Payer: Self-pay | Admitting: General Surgery

## 2013-06-24 ENCOUNTER — Telehealth (INDEPENDENT_AMBULATORY_CARE_PROVIDER_SITE_OTHER): Payer: Self-pay | Admitting: *Deleted

## 2013-06-24 ENCOUNTER — Ambulatory Visit (INDEPENDENT_AMBULATORY_CARE_PROVIDER_SITE_OTHER): Payer: Medicare Other | Admitting: General Surgery

## 2013-06-24 VITALS — BP 122/70 | HR 72 | Temp 97.7°F | Resp 18 | Ht 65.0 in | Wt 144.0 lb

## 2013-06-24 DIAGNOSIS — K810 Acute cholecystitis: Secondary | ICD-10-CM

## 2013-06-24 NOTE — Progress Notes (Signed)
Patient ID: Mike Mack, male   DOB: 21-Jul-1935, 78 y.o.   MRN: [id number]  Chief Complaint  Patient presents with  . Follow-up    HPI Mike Mack is a 78 y.o. male.  This gentleman was hospitalized on 05/13/2013 with back pain, enterococcus UTI, constipation, abdominal pain, abdominal distention. Workup showed a WBC of 18,900, normal liver function tests, normal lipase. CT scan showed a huge stool burden, distention of the small bowel, distention of the gallbladder, gallbladder wall thickening, and gallstones. He was tender locally in the right upper quadrant. Chest x-ray showed basilar atelectasis and possible pneumonia. Because of the complex of the of his medical problems and abdominal distention we elected to treat his presumed acute cholecystitis with cholelithiasis with a percutaneous drain and antibiotics. He improved and resumed bowel function and diet and was discharged on 05/19/2013. Since that time his medical condition has stabilized. He has no pain. He has low-volume drainage from the gallbladder drain. He enjoys normal diet and normal bowel function. He saw Dr. Venia Minks yesterday and was told that his medical conditions are stable. Lab work was drawn.   He has numerous comorbidities including insulin-dependent diabetes mellitus, hypertension, history of stroke with persistent right-sided weakness, ambulates with a walker, recent enterococcus UTI, remote history of polio history of right inguinal hernia repair, history of exploratory laparotomy with lysis of adhesions in 2002 for SBO(Gerkin)  He is here with his wife today. He ambulated in all his walker. He is in no distress.Marland Kitchen  HPI  Past Medical History  Diagnosis Date  . Diabetes mellitus without complication   . Hypertension   . Small bowel obstruction   . BPH (benign prostatic hyperplasia)   . Neuropathy   . Stroke   . Polio   . Polio Childhood  . Arthritis   . Constipation 05/13/2013  . Anemia 05/13/2013    Past  Surgical History  Procedure Laterality Date  . Bowel resection    . Hernia repair    . Cataract extraction w/ intraocular lens  implant, bilateral      Family History  Problem Relation Age of Onset  . Diabetes Sister   . Diabetes Sister   . Diabetes Brother   . Hypertension Sister   . Hypertension Sister   . Hypertension Brother   . Heart failure Mother   . Heart attack Sister     Social History History  Substance Use Topics  . Smoking status: Never Smoker   . Smokeless tobacco: Never Used  . Alcohol Use: No    No Known Allergies  Current Outpatient Prescriptions  Medication Sig Dispense Refill  . acetaminophen (TYLENOL) 325 MG tablet Take 2 tablets (650 mg total) by mouth every 6 (six) hours as needed for mild pain, fever or headache (or Fever >/= 101).  30 tablet  0  . albuterol (PROVENTIL) (5 MG/ML) 0.5% nebulizer solution Take 0.5 mLs (2.5 mg total) by nebulization every 6 (six) hours as needed for wheezing or shortness of breath.  20 mL  12  . alum & mag hydroxide-simeth (MAALOX/MYLANTA) 200-200-20 MG/5ML suspension Take 30 mLs by mouth every 6 (six) hours as needed for indigestion or heartburn (dyspepsia).  355 mL  0  . amLODipine (NORVASC) 5 MG tablet Take 5 mg by mouth every morning.       Marland Kitchen amoxicillin-clavulanate (AUGMENTIN) 875-125 MG per tablet Take 1 tablet by mouth 2 (two) times daily.  28 tablet  0  . aspirin 325 MG tablet Take 1 tablet (  325 mg total) by mouth daily.      Marland Kitchen atorvastatin (LIPITOR) 20 MG tablet Take 1 tablet by mouth every evening.      . docusate sodium 100 MG CAPS Take 100 mg by mouth 2 (two) times daily.  10 capsule  0  . doxycycline (VIBRA-TABS) 100 MG tablet Take 1 tablet (100 mg total) by mouth 2 (two) times daily.  28 tablet  0  . gabapentin (NEURONTIN) 800 MG tablet Take 1 tablet by mouth 3 (three) times daily as needed (HEADACHES).       Marland Kitchen guaiFENesin (ROBITUSSIN) 100 MG/5ML SOLN Take 10 mLs (200 mg total) by mouth every 4 (four) hours  as needed.  1200 mL  0  . insulin detemir (LEVEMIR) 100 UNIT/ML injection Inject 0.25 mLs (25 Units total) into the skin 2 (two) times daily.  10 mL  12  . irbesartan (AVAPRO) 300 MG tablet Take 300 mg by mouth at bedtime.      . mineral oil liquid Take 15 mLs by mouth daily as needed for moderate constipation.      Marland Kitchen morphine (MSIR) 15 MG tablet Take 1 tablet (15 mg total) by mouth every 4 (four) hours as needed for severe pain.  30 tablet  0  . ondansetron (ZOFRAN) 4 MG tablet Take 1 tablet (4 mg total) by mouth every 6 (six) hours as needed for nausea.  20 tablet  0  . polyethylene glycol (MIRALAX / GLYCOLAX) packet Take 17 g by mouth 3 (three) times daily.  14 each  0  . predniSONE (DELTASONE) 1 MG tablet Take 6 mg by mouth every morning. Take with 5 mg as directed. Total daily dose 6mg       . sodium phosphate (FLEET) 7-19 GM/118ML ENEM Place 1 enema rectally daily as needed for severe constipation.  10 enema  0  . solifenacin (VESICARE) 10 MG tablet Take 10 mg by mouth every evening.      . Tamsulosin HCl (FLOMAX) 0.4 MG CAPS Take 0.4 mg by mouth 2 (two) times daily.       . traMADol (ULTRAM) 50 MG tablet Take 1 tablet (50 mg total) by mouth every 6 (six) hours as needed.  120 tablet  5  . Travoprost, BAK Free, (TRAVATAN) 0.004 % SOLN ophthalmic solution Place 1 drop into both eyes at bedtime.       No current facility-administered medications for this visit.    Review of Systems Review of Systems  Constitutional: Negative for fever, chills and unexpected weight change.  HENT: Negative for congestion, hearing loss, sore throat, trouble swallowing and voice change.   Eyes: Negative for visual disturbance.  Respiratory: Negative for cough and wheezing.   Cardiovascular: Negative for chest pain, palpitations and leg swelling.  Gastrointestinal: Negative for nausea, vomiting, abdominal pain, diarrhea, constipation, blood in stool, abdominal distention, anal bleeding and rectal pain.   Genitourinary: Negative for hematuria and difficulty urinating.  Musculoskeletal: Positive for gait problem. Negative for arthralgias.  Skin: Negative for rash and wound.  Neurological: Positive for weakness. Negative for seizures, syncope and headaches.  Hematological: Negative for adenopathy. Does not bruise/bleed easily.  Psychiatric/Behavioral: Negative for confusion.    Blood pressure 122/70, pulse 72, temperature 97.7 F (36.5 C), temperature source Oral, resp. rate 18, height 5\' 5"  (1.651 m), weight 144 lb (65.318 kg).  Physical Exam Physical Exam  Constitutional: He is oriented to person, place, and time. He appears well-developed and well-nourished. No distress.  HENT:  Head: Normocephalic.  Nose: Nose normal.  Mouth/Throat: No oropharyngeal exudate.  Eyes: Conjunctivae and EOM are normal. Pupils are equal, round, and reactive to light. Right eye exhibits no discharge. Left eye exhibits no discharge. No scleral icterus.  Neck: Normal range of motion. Neck supple. No JVD present. No tracheal deviation present. No thyromegaly present.  Cardiovascular: Normal rate, regular rhythm, normal heart sounds and intact distal pulses.   No murmur heard. Pulmonary/Chest: Effort normal and breath sounds normal. No stridor. No respiratory distress. He has no wheezes. He has no rales. He exhibits no tenderness.  Abdominal: Soft. Bowel sounds are normal. He exhibits no distension and no mass. There is no tenderness. There is no rebound and no guarding.  Abdomen is soft and nontender. Midline scar well healed no hernia. Right upper quadrant drain draining turbid bilious fluid, low volume. Normal bowel sounds.  Musculoskeletal: Normal range of motion. He exhibits no edema and no tenderness.  Lymphadenopathy:    He has no cervical adenopathy.  Neurological: He is alert and oriented to person, place, and time. He has normal reflexes. Coordination normal.  Speech normal. Right-sided weakness,  detailed exam not performed.  Skin: Skin is warm and dry. No rash noted. He is not diaphoretic. No erythema. No pallor.  Psychiatric: He has a normal mood and affect. His behavior is normal. Judgment and thought content normal.    Data Reviewed Hospital records. I have requested labwork recently done and Dr. Eugenio Hoes office. I have scheduled him for injection study of the gallbladder drain.  Assessment    Acute cholecystitis with cholelithiasis, acute infectious event resolves following percutaneous drainage. He is at risk for future episodes, however and consideration is to be given to interval cholecystectomy  Recent stroke with right-sided weakness  Remote history of polio  Insulin-dependent diabetes mellitus  Recent enterococcus UTI, treated  History right inguinal hernia repair  History laparotomy with lysis of adhesions for SBO 2010  Hypertension  BPH     Plan    We had a long talk about long-term management of his biliary tract disease. I told him that ideally, we would eventually  perform a cholecystectomy to prevent future episodes. We would carefully consider the risk benefit ratio of this.  He will be scheduled for injection study of the cholecystostomy drainage in radiology next week  Return to see me in 3 weeks for further discussion about possible cholecystectomy. I believe this can be performed laparoscopically, but he is at increased risk for conversion to open because of his previous adhesions.        Edsel Petrin. Dalbert Batman, M.D., [facility] General and Minimally invasive Surgery Breast and Colorectal Surgery Office:   [phone] Pager:   [phone]  06/24/2013, 9:22 AM

## 2013-06-24 NOTE — Telephone Encounter (Signed)
I spoke with pts son Sherren Mocha and informed him of pts appt for the injection study of his GB tube at [facility] on 07/03/13 with an arrival time of 7:30am.  Pt is currently with son and he will be informed of this appt.  He is agreeable at this time.

## 2013-06-24 NOTE — Patient Instructions (Signed)
Your medical condition has improved significantly since your hospitalization in November.  You will be scheduled for a x-ray study of the gallbladder drain.  Return to see [name] in approximately 3 weeks. We will discuss whether to go ahead with a gallbladder operation at that time.  Before we do any surgery, we will ask Dr. Wilson Singer for a risk assessment from a medical perspective.  Do everything that you can to avoid being constipated.

## 2013-07-03 ENCOUNTER — Other Ambulatory Visit (INDEPENDENT_AMBULATORY_CARE_PROVIDER_SITE_OTHER): Payer: Self-pay | Admitting: General Surgery

## 2013-07-03 ENCOUNTER — Ambulatory Visit (HOSPITAL_COMMUNITY)
Admission: RE | Admit: 2013-07-03 | Discharge: 2013-07-03 | Disposition: A | Discharge status: Home / Self Care | Payer: Medicare Other | Source: Ambulatory Visit | Attending: General Surgery | Admitting: General Surgery

## 2013-07-03 DIAGNOSIS — K810 Acute cholecystitis: Secondary | ICD-10-CM

## 2013-07-03 DIAGNOSIS — T85698A Other mechanical complication of other specified internal prosthetic devices, implants and grafts, initial encounter: Secondary | ICD-10-CM | POA: Insufficient documentation

## 2013-07-03 DIAGNOSIS — Y849 Medical procedure, unspecified as the cause of abnormal reaction of the patient, or of later complication, without mention of misadventure at the time of the procedure: Secondary | ICD-10-CM | POA: Insufficient documentation

## 2013-07-03 MED ORDER — IOHEXOL 300 MG/ML  SOLN
50.0000 mL | Freq: Once | INTRAMUSCULAR | Status: AC | PRN
  Administered 2013-07-03: 10 mL

## 2013-07-08 ENCOUNTER — Encounter (INDEPENDENT_AMBULATORY_CARE_PROVIDER_SITE_OTHER): Payer: Self-pay

## 2013-07-21 ENCOUNTER — Encounter (INDEPENDENT_AMBULATORY_CARE_PROVIDER_SITE_OTHER): Payer: Self-pay | Admitting: General Surgery

## 2013-07-21 ENCOUNTER — Ambulatory Visit (INDEPENDENT_AMBULATORY_CARE_PROVIDER_SITE_OTHER): Payer: Medicare Other | Admitting: General Surgery

## 2013-07-21 VITALS — BP 140/64 | HR 96 | Temp 98.5°F | Resp 15 | Ht 65.0 in | Wt 147.8 lb

## 2013-07-21 DIAGNOSIS — K810 Acute cholecystitis: Secondary | ICD-10-CM

## 2013-07-21 NOTE — Patient Instructions (Signed)
We have discussed your gallbladder disease and gallstone disease. We have discussed the natural history and probable recurrent attacks if nothing is done. We have discussed the surgery that can be done to remove your gallbladder.  You'll be scheduled for laparoscopic cholecystectomy with cholangiogram, possible open cholecystectomy in the future.    Laparoscopic Cholecystectomy Laparoscopic cholecystectomy is surgery to remove the gallbladder. The gallbladder is located in the upper right part of the abdomen, behind the liver. It is a storage sac for bile produced in the liver. Bile aids in the digestion and absorption of fats. Cholecystectomy is often done for inflammation of the gallbladder (cholecystitis). This condition is usually caused by a buildup of gallstones (cholelithiasis) in your gallbladder. Gallstones can block the flow of bile, resulting in inflammation and pain. In severe cases, emergency surgery may be required. When emergency surgery is not required, you will have time to prepare for the procedure. Laparoscopic surgery is an alternative to open surgery. Laparoscopic surgery has a shorter recovery time. Your common bile duct may also need to be examined during the procedure. If stones are found in the common bile duct, they may be removed. LET [facility] CARE PROVIDER KNOW ABOUT:  Any allergies you have.  All medicines you are taking, including vitamins, herbs, eye drops, creams, and over-the-counter medicines.  Previous problems you or members of your family have had with the use of anesthetics.  Any blood disorders you have.  Previous surgeries you have had.  Medical conditions you have. RISKS AND COMPLICATIONS Generally, this is a safe procedure. However, as with any procedure, complications can occur. Possible complications include:  Infection.  Damage to the common bile duct, nerves, arteries, veins, or other internal organs such as the stomach, liver, or  intestines.  Bleeding.  A stone may remain in the common bile duct.  A bile leak from the cyst duct that is clipped when your gallbladder is removed.  The need to convert to open surgery, which requires a larger incision in the abdomen. This may be necessary if your surgeon thinks it is not safe to continue with a laparoscopic procedure. BEFORE THE PROCEDURE  Ask your health care provider about changing or stopping any regular medicines. You will need to stop taking aspirin or blood thinners at least 5 days prior to surgery.  Do not eat or drink anything after midnight the night before surgery.  Let your health care provider know if you develop a cold or other infectious problem before surgery. PROCEDURE   You will be given medicine to make you sleep through the procedure (general anesthetic). A breathing tube will be placed in your mouth.  When you are asleep, your surgeon will make several small cuts (incisions) in your abdomen.  A thin, lighted tube with a tiny camera on the end (laparoscope) is inserted through one of the small incisions. The camera on the laparoscope sends a picture to a TV screen in the operating room. This gives the surgeon a good view inside your abdomen.  A gas will be pumped into your abdomen. This expands your abdomen so that the surgeon has more room to perform the surgery.  Other tools needed for the procedure are inserted through the other incisions. The gallbladder is removed through one of the incisions.  After the removal of your gallbladder, the incisions will be closed with stitches, staples, or skin glue. AFTER THE PROCEDURE  You will be taken to a recovery area where your progress will be checked  often.  You may be allowed to go home the same day if your pain is controlled and you can tolerate liquids. Document Released: 06/05/2005 Document Revised: 03/26/2013 Document Reviewed: 01/15/2013 [facility] Patient Information 2014 [place].

## 2013-07-21 NOTE — Progress Notes (Signed)
Patient ID: Mike Mack, male   DOB: [birth year], 78 y.o.   MRN: [id number]  Chief Complaint  Patient presents with  . Routine Post Op    f/u cholecystostomy tube     HPI Mike Mack is a 78 y.o. male.  He returns for further discussion of his gallstone disease.  Initial history is outlined below: This gentleman was hospitalized on 05/13/2013 with back pain, enterococcus UTI, constipation, abdominal pain, abdominal distention. Workup showed a WBC of 18,900, normal liver function tests, normal lipase. CT scan showed a huge stool burden, distention of the small bowel, distention of the gallbladder, gallbladder wall thickening, and gallstones. He was tender locally in the right upper quadrant. Chest x-ray showed basilar atelectasis and possible pneumonia. Because of the complex of the of his medical problems and abdominal distention we elected to treat his presumed acute cholecystitis with cholelithiasis with a percutaneous drain and antibiotics. He improved and resumed bowel function and diet and was discharged on 05/19/2013. Since that time his medical condition has stabilized. He has no pain. Marland Kitchen He enjoys normal diet and normal bowel function. He saw Dr. Wilson Singer  and was told that his medical conditions are stable.    He went for a drain study but the pigtail catheter had become dislodged and was external to the gallbladder, and so the drain was removed. He remains stable. Hasn't had any more attacks of abdominal pain. Tolerating diet. He ambulates with a cane some days  and a walker other days. He is here today with his wife.  He has numerous comorbidities including insulin-dependent diabetes mellitus, hypertension, history of stroke with persistent right-sided weakness, ambulates with a walker, recent enterococcus UTI, remote history of polio history of right inguinal hernia repair, history of exploratory laparotomy with lysis of adhesions in 2002 for SBO(Gerkin)   HPI  Past Medical History   Diagnosis Date  . Diabetes mellitus without complication   . Hypertension   . Small bowel obstruction   . BPH (benign prostatic hyperplasia)   . Neuropathy   . Stroke   . Polio   . Polio Childhood  . Arthritis   . Constipation 05/13/2013  . Anemia 05/13/2013    Past Surgical History  Procedure Laterality Date  . Bowel resection    . Hernia repair    . Cataract extraction w/ intraocular lens  implant, bilateral      Family History  Problem Relation Age of Onset  . Diabetes Sister   . Diabetes Sister   . Diabetes Brother   . Hypertension Sister   . Hypertension Sister   . Hypertension Brother   . Heart failure Mother   . Heart attack Sister     Social History History  Substance Use Topics  . Smoking status: Never Smoker   . Smokeless tobacco: Never Used  . Alcohol Use: No    No Known Allergies  Current Outpatient Prescriptions  Medication Sig Dispense Refill  . acetaminophen (TYLENOL) 325 MG tablet Take 2 tablets (650 mg total) by mouth every 6 (six) hours as needed for mild pain, fever or headache (or Fever >/= 101).  30 tablet  0  . albuterol (PROVENTIL) (5 MG/ML) 0.5% nebulizer solution Take 0.5 mLs (2.5 mg total) by nebulization every 6 (six) hours as needed for wheezing or shortness of breath.  20 mL  12  . alum & mag hydroxide-simeth (MAALOX/MYLANTA) 200-200-20 MG/5ML suspension Take 30 mLs by mouth every 6 (six) hours as needed for indigestion or heartburn (dyspepsia).  355 mL  0  . amLODipine (NORVASC) 5 MG tablet Take 5 mg by mouth every morning.       Marland Kitchen amoxicillin-clavulanate (AUGMENTIN) 875-125 MG per tablet Take 1 tablet by mouth 2 (two) times daily.  28 tablet  0  . aspirin 325 MG tablet Take 1 tablet (325 mg total) by mouth daily.      Marland Kitchen atorvastatin (LIPITOR) 20 MG tablet Take 1 tablet by mouth every evening.      . docusate sodium 100 MG CAPS Take 100 mg by mouth 2 (two) times daily.  10 capsule  0  . gabapentin (NEURONTIN) 800 MG tablet Take 1  tablet by mouth 3 (three) times daily as needed (HEADACHES).       Marland Kitchen guaiFENesin (ROBITUSSIN) 100 MG/5ML SOLN Take 10 mLs (200 mg total) by mouth every 4 (four) hours as needed.  1200 mL  0  . insulin detemir (LEVEMIR) 100 UNIT/ML injection Inject 0.25 mLs (25 Units total) into the skin 2 (two) times daily.  10 mL  12  . irbesartan (AVAPRO) 300 MG tablet Take 300 mg by mouth at bedtime.      Marland Kitchen latanoprost (XALATAN) 0.005 % ophthalmic solution       . mineral oil liquid Take 15 mLs by mouth daily as needed for moderate constipation.      . ondansetron (ZOFRAN) 4 MG tablet Take 1 tablet (4 mg total) by mouth every 6 (six) hours as needed for nausea.  20 tablet  0  . ONE TOUCH ULTRA TEST test strip       . polyethylene glycol (MIRALAX / GLYCOLAX) packet Take 17 g by mouth 3 (three) times daily.  14 each  0  . predniSONE (DELTASONE) 1 MG tablet Take 6 mg by mouth every morning. Take with 5 mg as directed. Total daily dose 6mg       . sodium phosphate (FLEET) 7-19 GM/118ML ENEM Place 1 enema rectally daily as needed for severe constipation.  10 enema  0  . solifenacin (VESICARE) 10 MG tablet Take 10 mg by mouth every evening.      . Tamsulosin HCl (FLOMAX) 0.4 MG CAPS Take 0.4 mg by mouth 2 (two) times daily.       . traMADol (ULTRAM) 50 MG tablet Take 1 tablet (50 mg total) by mouth every 6 (six) hours as needed.  120 tablet  5  . Travoprost, BAK Free, (TRAVATAN) 0.004 % SOLN ophthalmic solution Place 1 drop into both eyes at bedtime.       No current facility-administered medications for this visit.    Review of Systems Review of Systems  Constitutional: Positive for activity change, appetite change and fatigue. Negative for fever, chills and unexpected weight change.  HENT: Negative for congestion, hearing loss, sore throat, trouble swallowing and voice change.   Eyes: Negative for visual disturbance.  Respiratory: Negative for cough and wheezing.   Cardiovascular: Negative for chest pain,  palpitations and leg swelling.  Gastrointestinal: Negative for nausea, vomiting, abdominal pain, diarrhea, constipation, blood in stool, abdominal distention, anal bleeding and rectal pain.  Genitourinary: Negative for hematuria and difficulty urinating.  Musculoskeletal: Positive for gait problem. Negative for arthralgias.  Skin: Negative for rash and wound.  Neurological: Positive for weakness. Negative for seizures, syncope and headaches.  Hematological: Negative for adenopathy. Does not bruise/bleed easily.  Psychiatric/Behavioral: Negative for confusion.    Blood pressure 140/64, pulse 96, temperature 98.5 F (36.9 C), temperature source Oral, resp. rate 15, height 5'  5" (1.651 m), weight 147 lb 12.8 oz (67.042 kg).  Physical Exam Physical Exam   Constitutional: He is oriented to person, place, and time. He appears well-developed and well-nourished. No distress.  HENT:  Head: Normocephalic.  Nose: Nose normal.  Mouth/Throat: No oropharyngeal exudate.  Eyes: Conjunctivae and EOM are normal. Pupils are equal, round, and reactive to light. Right eye exhibits no discharge. Left eye exhibits no discharge. No scleral icterus.  Neck: Normal range of motion. Neck supple. No JVD present. No tracheal deviation present. No thyromegaly present.  Cardiovascular: Normal rate, regular rhythm, normal heart sounds and intact distal pulses.  No murmur heard.  Pulmonary/Chest: Effort normal and breath sounds normal. No stridor. No respiratory distress. He has no wheezes. He has no rales. He exhibits no tenderness.  Abdominal: Soft. Bowel sounds are normal. He exhibits no distension and no mass. There is no tenderness. There is no rebound and no guarding.  Abdomen is soft and nontender. Midline scar well healed no hernia. Normal bowel sounds.  Musculoskeletal: Normal range of motion. He exhibits no edema and no tenderness.  Lymphadenopathy:  He has no cervical adenopathy.  Neurological: He is alert  and oriented to person, place, and time. He has normal reflexes. Coordination normal.  Speech normal. Right-sided weakness, detailed exam not performed.  Skin: Skin is warm and dry. No rash noted. He is not diaphoretic. No erythema. No pallor.  Psychiatric: He has a normal mood and affect. His behavior is normal. Judgment and thought content normal.    Data Reviewed Hospital records. Office notes. Imaging studies.  Assessment    Acute cholecystitis with cholelithiasis, acute infectious event resolved following percutaneous drainage. He is at risk for future episodes, however and consideration is to be given to interval cholecystectomy   Recent stroke with right-sided weakness  Remote history of polio  Insulin-dependent diabetes mellitus  Recent enterococcus UTI, treated  History right inguinal hernia repair  History laparotomy with lysis of adhesions for SBO 2010  Hypertension  BPH      Plan    Had another long talk with the patient and his wife. Would've her patient information booklet of diagrams. He understands that he is at risk for future episodes of acute cholecystitis. He was offered cholecystectomy and he states that he would like to do that.  He'll be scheduled for all elective laparoscopic cholecystectomy with cholangiogram, possible open. He knows that he is at increased risk for conversion to open because of his prior laparotomy for bowel obstruction. I discussed the indications, details, techniques, and numerous risk of the surgery with him and his wife. They are aware of  the risk of bleeding, infection, bile leak, injury to adjacent organs, and other unforeseen problems. All his questions are answered. He understands all these issues. He and agrees with this plan.       Edsel Petrin. Dalbert Batman, M.D., [facility] General and Minimally invasive Surgery Breast and Colorectal Surgery Office:   [phone] Pager:   [phone]  07/21/2013, 2:50  PM

## 2013-07-23 NOTE — H&P (Signed)
Makhai Fulco   MRN:  [id number]   Description: 78 year old male  Provider: [name], MD  Department: [facility]         Diagnoses      Cholecystitis, acute    -  Primary      575.0             Reason for Visit      Routine Post Op      f/u cholecystostomy tube              Current Vitals - Last Recorded      BP Pulse Temp(Src) Resp Ht Wt      140/64 96 98.5 F (36.9 C) (Oral) 15 5\' 5"  (1.651 m) 147 lb 12.8 oz (67.042 kg)       BMI - 24.60 kg/m2                     History and Physical    [name], MD      Status: Signed            Patient ID: Arn Medal, male   DOB: 08/16/1935, 78 y.o.   MRN: [id number]              HPI Seward Coran is a 78 y.o. male.  He returns for further discussion of his gallstone disease.   Initial history is outlined below: This gentleman was hospitalized on 05/13/2013 with back pain, enterococcus UTI, constipation, abdominal pain, abdominal distention. Workup showed a WBC of 18,900, normal liver function tests, normal lipase. CT scan showed a huge stool burden, distention of the small bowel, distention of the gallbladder, gallbladder wall thickening, and gallstones. He was tender locally in the right upper quadrant. Chest x-ray showed basilar atelectasis and possible pneumonia. Because of the complex of the of his medical problems and abdominal distention we elected to treat his presumed acute cholecystitis with cholelithiasis with a percutaneous drain and antibiotics. He improved and resumed bowel function and diet and was discharged on 05/19/2013. Since that time his medical condition has stabilized. He has no pain. Marland Kitchen He enjoys normal diet and normal bowel function. He saw Dr. Wilson Singer  and was told that his medical conditions are stable.     He went for a drain study but the pigtail catheter had become dislodged and was external to the gallbladder, and so the drain was removed. He remains stable. Hasn't had  any more attacks of abdominal pain. Tolerating diet. He ambulates with a cane some days  and a walker other days. He is here today with his wife.   He has numerous comorbidities including insulin-dependent diabetes mellitus, hypertension, history of stroke with persistent right-sided weakness, ambulates with a walker, recent enterococcus UTI, remote history of polio history of right inguinal hernia repair, history of exploratory laparotomy with lysis of adhesions in 2002 for SBO([facility])         Past Medical History   Diagnosis  Date   .  Diabetes mellitus without complication     .  Hypertension     .  Small bowel obstruction     .  BPH (benign prostatic hyperplasia)     .  Neuropathy     .  Stroke     .  Polio     .  Polio  Childhood   .  Arthritis     .  Constipation  05/13/2013   .  Anemia  05/13/2013         Past Surgical History   Procedure  Laterality  Date   .  Bowel resection       .  Hernia repair       .  Cataract extraction w/ intraocular lens  implant, bilateral             Family History   Problem  Relation  Age of Onset   .  Diabetes  Sister     .  Diabetes  Sister     .  Diabetes  Brother     .  Hypertension  Sister     .  Hypertension  Sister     .  Hypertension  Brother     .  Heart failure  Mother     .  Heart attack  Sister          Social History History   Substance Use Topics   .  Smoking status:  Never Smoker    .  Smokeless tobacco:  Never Used   .  Alcohol Use:  No        No Known Allergies    Current Outpatient Prescriptions   Medication  Sig  Dispense  Refill   .  acetaminophen (TYLENOL) 325 MG tablet  Take 2 tablets (650 mg total) by mouth every 6 (six) hours as needed for mild pain, fever or headache (or Fever >/= 101).   30 tablet   0   .  albuterol (PROVENTIL) (5 MG/ML) 0.5% nebulizer solution  Take 0.5 mLs (2.5 mg total) by nebulization every 6 (six) hours as needed for wheezing or shortness of breath.   20 mL   12   .   alum & mag hydroxide-simeth (MAALOX/MYLANTA) 200-200-20 MG/5ML suspension  Take 30 mLs by mouth every 6 (six) hours as needed for indigestion or heartburn (dyspepsia).   355 mL   0   .  amLODipine (NORVASC) 5 MG tablet  Take 5 mg by mouth every morning.          Marland Kitchen  amoxicillin-clavulanate (AUGMENTIN) 875-125 MG per tablet  Take 1 tablet by mouth 2 (two) times daily.   28 tablet   0   .  aspirin 325 MG tablet  Take 1 tablet (325 mg total) by mouth daily.         Marland Kitchen  atorvastatin (LIPITOR) 20 MG tablet  Take 1 tablet by mouth every evening.         .  docusate sodium 100 MG CAPS  Take 100 mg by mouth 2 (two) times daily.   10 capsule   0   .  gabapentin (NEURONTIN) 800 MG tablet  Take 1 tablet by mouth 3 (three) times daily as needed (HEADACHES).          Marland Kitchen  guaiFENesin (ROBITUSSIN) 100 MG/5ML SOLN  Take 10 mLs (200 mg total) by mouth every 4 (four) hours as needed.   1200 mL   0   .  insulin detemir (LEVEMIR) 100 UNIT/ML injection  Inject 0.25 mLs (25 Units total) into the skin 2 (two) times daily.   10 mL   12   .  irbesartan (AVAPRO) 300 MG tablet  Take 300 mg by mouth at bedtime.         Marland Kitchen  latanoprost (XALATAN) 0.005 % ophthalmic solution           .  mineral oil liquid  Take 15 mLs by  mouth daily as needed for moderate constipation.         .  ondansetron (ZOFRAN) 4 MG tablet  Take 1 tablet (4 mg total) by mouth every 6 (six) hours as needed for nausea.   20 tablet   0   .  ONE TOUCH ULTRA TEST test strip           .  polyethylene glycol (MIRALAX / GLYCOLAX) packet  Take 17 g by mouth 3 (three) times daily.   14 each   0   .  predniSONE (DELTASONE) 1 MG tablet  Take 6 mg by mouth every morning. Take with 5 mg as directed. Total daily dose 6mg          .  sodium phosphate (FLEET) 7-19 GM/118ML ENEM  Place 1 enema rectally daily as needed for severe constipation.   10 enema   0   .  solifenacin (VESICARE) 10 MG tablet  Take 10 mg by mouth every evening.         .  Tamsulosin HCl (FLOMAX) 0.4 MG  CAPS  Take 0.4 mg by mouth 2 (two) times daily.          .  traMADol (ULTRAM) 50 MG tablet  Take 1 tablet (50 mg total) by mouth every 6 (six) hours as needed.   120 tablet   5   .  Travoprost, BAK Free, (TRAVATAN) 0.004 % SOLN ophthalmic solution  Place 1 drop into both eyes at bedtime.             No current facility-administered medications for this visit.        Review of Systems   Constitutional: Positive for activity change, appetite change and fatigue. Negative for fever, chills and unexpected weight change.  HENT: Negative for congestion, hearing loss, sore throat, trouble swallowing and voice change.   Eyes: Negative for visual disturbance.  Respiratory: Negative for cough and wheezing.   Cardiovascular: Negative for chest pain, palpitations and leg swelling.  Gastrointestinal: Negative for nausea, vomiting, abdominal pain, diarrhea, constipation, blood in stool, abdominal distention, anal bleeding and rectal pain.  Genitourinary: Negative for hematuria and difficulty urinating.  Musculoskeletal: Positive for gait problem. Negative for arthralgias.  Skin: Negative for rash and wound.  Neurological: Positive for weakness. Negative for seizures, syncope and headaches.  Hematological: Negative for adenopathy. Does not bruise/bleed easily.  Psychiatric/Behavioral: Negative for confusion.      Blood pressure 140/64, pulse 96, temperature 98.5 F (36.9 C), temperature source Oral, resp. rate 15, height 5\' 5"  (1.651 m), weight 147 lb 12.8 oz (67.042 kg).   Physical Exam    Constitutional: He is oriented to person, place, and time. He appears well-developed and well-nourished. No distress.   HENT:   Head: Normocephalic.   Nose: Nose normal.   Mouth/Throat: No oropharyngeal exudate.   Eyes: Conjunctivae and EOM are normal. Pupils are equal, round, and reactive to light. Right eye exhibits no discharge. Left eye exhibits no discharge. No scleral icterus.   Neck: Normal range  of motion. Neck supple. No JVD present. No tracheal deviation present. No thyromegaly present.   Cardiovascular: Normal rate, regular rhythm, normal heart sounds and intact distal pulses.   No murmur heard.   Pulmonary/Chest: Effort normal and breath sounds normal. No stridor. No respiratory distress. He has no wheezes. He has no rales. He exhibits no tenderness.   Abdominal: Soft. Bowel sounds are normal. He exhibits no distension and no mass. There is no tenderness. There  is no rebound and no guarding.  Abdomen is soft and nontender. Midline scar well healed no hernia. Normal bowel sounds.  Musculoskeletal: Normal range of motion. He exhibits no edema and no tenderness.  Lymphadenopathy:  He has no cervical adenopathy.  Neurological: He is alert and oriented to person, place, and time. He has normal reflexes. Coordination normal.  Speech normal. Right-sided weakness, detailed exam not performed.   Skin: Skin is warm and dry. No rash noted. He is not diaphoretic. No erythema. No pallor.  Psychiatric: He has a normal mood and affect. His behavior is normal. Judgment and thought content normal.      Data Reviewed Hospital records. Office notes. Imaging studies.   Assessment     Acute cholecystitis with cholelithiasis, acute infectious event resolved following percutaneous drainage. He is at risk for future episodes, however and consideration is to be given to interval cholecystectomy    Recent stroke with right-sided weakness   Remote history of polio   Insulin-dependent diabetes mellitus   Recent enterococcus UTI, treated   History right inguinal hernia repair   History laparotomy with lysis of adhesions for SBO 2010   Hypertension   BPH        Plan    Had another long talk with the patient and his wife. Went over patient information booklet of diagrams. He understands that he is at risk for future episodes of acute cholecystitis. He was offered cholecystectomy and he states  that he would like to do that.   He'll be scheduled for all elective laparoscopic cholecystectomy with cholangiogram, possible open. He knows that he is at increased risk for conversion to open because of his prior laparotomy for bowel obstruction. I discussed the indications, details, techniques, and numerous risk of the surgery with him and his wife. They are aware of  the risk of bleeding, infection, bile leak, injury to adjacent organs, and other unforeseen problems. All his questions are answered. He understands all these issues. He and agrees with this plan.          Edsel Petrin. Dalbert Batman, M.D., [facility] General and Minimally invasive Surgery Breast and Colorectal Surgery Office:   [phone] Pager:   [phone]

## 2013-07-24 ENCOUNTER — Ambulatory Visit (HOSPITAL_COMMUNITY)
Admission: RE | Admit: 2013-07-24 | Discharge: 2013-07-24 | Disposition: A | Discharge status: Home / Self Care | Payer: Medicare Other | Source: Ambulatory Visit | Attending: General Surgery | Admitting: General Surgery

## 2013-07-24 ENCOUNTER — Encounter (HOSPITAL_COMMUNITY)
Admission: RE | Admit: 2013-07-24 | Discharge: 2013-07-24 | Disposition: A | Discharge status: Home / Self Care | Payer: Medicare Other | Source: Ambulatory Visit | Attending: General Surgery | Admitting: General Surgery

## 2013-07-24 ENCOUNTER — Encounter (HOSPITAL_COMMUNITY): Payer: Self-pay

## 2013-07-24 DIAGNOSIS — Z8612 Personal history of poliomyelitis: Secondary | ICD-10-CM | POA: Insufficient documentation

## 2013-07-24 DIAGNOSIS — E119 Type 2 diabetes mellitus without complications: Secondary | ICD-10-CM

## 2013-07-24 DIAGNOSIS — I10 Essential (primary) hypertension: Secondary | ICD-10-CM | POA: Insufficient documentation

## 2013-07-24 DIAGNOSIS — K8020 Calculus of gallbladder without cholecystitis without obstruction: Secondary | ICD-10-CM

## 2013-07-24 DIAGNOSIS — Z01812 Encounter for preprocedural laboratory examination: Secondary | ICD-10-CM | POA: Insufficient documentation

## 2013-07-24 DIAGNOSIS — Z87891 Personal history of nicotine dependence: Secondary | ICD-10-CM

## 2013-07-24 DIAGNOSIS — Z8673 Personal history of transient ischemic attack (TIA), and cerebral infarction without residual deficits: Secondary | ICD-10-CM | POA: Insufficient documentation

## 2013-07-24 LAB — BASIC METABOLIC PANEL
BUN: 25 mg/dL — ABNORMAL HIGH (ref 6–23)
CALCIUM: 9.2 mg/dL (ref 8.4–10.5)
CO2: 24 mEq/L (ref 19–32)
CREATININE: 1.31 mg/dL (ref 0.50–1.35)
Chloride: 102 mEq/L (ref 96–112)
GFR calc non Af Amer: 52 mL/min — ABNORMAL LOW (ref >90)
GFR, EST AFRICAN AMERICAN: 60 mL/min — AB (ref >90)
Glucose, Bld: 202 mg/dL — ABNORMAL HIGH (ref 70–99)
Potassium: 5.1 mEq/L (ref 3.7–5.3)
Sodium: 139 mEq/L (ref 137–147)

## 2013-07-24 LAB — CBC
HCT: 34.8 % — ABNORMAL LOW (ref 39.0–52.0)
Hemoglobin: 11.3 g/dL — ABNORMAL LOW (ref 13.0–17.0)
MCH: 28.1 pg (ref 26.0–34.0)
MCHC: 32.5 g/dL (ref 30.0–36.0)
MCV: 86.6 fL (ref 78.0–100.0)
PLATELETS: 340 10*3/uL (ref 150–400)
RBC: 4.02 MIL/uL — ABNORMAL LOW (ref 4.22–5.81)
RDW: 15.1 % (ref 11.5–15.5)
WBC: 7.8 10*3/uL (ref 4.0–10.5)

## 2013-07-24 LAB — SURGICAL PCR SCREEN
MRSA, PCR: NEGATIVE
Staphylococcus aureus: NEGATIVE

## 2013-07-24 MED ORDER — CEFAZOLIN SODIUM-DEXTROSE 2-3 GM-% IV SOLR
2.0000 g | INTRAVENOUS | Status: AC
  Administered 2013-07-25: 2 g via INTRAVENOUS
  Filled 2013-07-24: qty 50

## 2013-07-24 NOTE — Progress Notes (Signed)
07/24/13 1502  OBSTRUCTIVE SLEEP APNEA  Have you ever been diagnosed with sleep apnea through a sleep study? No  Do you snore loudly (loud enough to be heard through closed doors)?  0  Do you often feel tired, fatigued, or sleepy during the daytime? 1  Has anyone observed you stop breathing during your sleep? 0  Do you have, or are you being treated for high blood pressure? 1  BMI more than 35 kg/m2? 0  Age over 76 years old? 1  Neck circumference greater than 40 cm/18 inches? 0  Gender: 1  Obstructive Sleep Apnea Score 4  Score 4 or greater  Results sent to PCP   

## 2013-07-24 NOTE — Progress Notes (Signed)
Pt denies being under the care of a cardiologist.

## 2013-07-24 NOTE — Progress Notes (Signed)
Anesthesia: Chart reviewed. PAT was at [facility] for cholecystectomy at 7:15 AM tomorrow.  History includes non-smoker, HTN, DM2, CVA, polio, SBO with LOA '02, bowel resection. EKG on 05/13/13 is overall stable.  He had normal LV systolic function, EF 35-70%, and no regional wall motion abnormalties on 04/11/12. No significant carotid disease in 03/2012. CXR and labs from today noted. No chest pain reported at PAT.  Further evaluation by anesthesia on the day of surgery, but if no acute symptoms then I anticipate that he can proceed as planned.  George [facility]/Anesthesiology Phone [phone] 07/24/2013 5:39 PM

## 2013-07-24 NOTE — Progress Notes (Signed)
07/24/13 1502  OBSTRUCTIVE SLEEP APNEA  Have you ever been diagnosed with sleep apnea through a sleep study? No  Do you snore loudly (loud enough to be heard through closed doors)?  0  Do you often feel tired, fatigued, or sleepy during the daytime? 1  Has anyone observed you stop breathing during your sleep? 0  Do you have, or are you being treated for high blood pressure? 1  BMI more than 35 kg/m2? 0  Age over 76 years old? 1  Neck circumference greater than 40 cm/18 inches? 0  Gender: 1  Obstructive Sleep Apnea Score 4  Score 4 or greater  Results sent to PCP

## 2013-07-24 NOTE — Pre-Procedure Instructions (Signed)
Mike Mack  07/24/2013   Your procedure is scheduled on:  Friday, July 25, 2013 at 7:20 AM  Report to [facility] (use Main Entrance "A'') at 5:30 AM.  Call this number if you have problems the morning of surgery: [phone]   Remember:   Do not eat food or drink liquids after midnight.   Take these medicines the morning of surgery with A SIP OF WATER: amLODipine (NORVASC) 5 MG tablet, predniSONE (DELTASONE) 5 MG tablet, Tamsulosin HCl (FLOMAX) 0.4 MG CAPS, if needed: gabapentin (NEURONTIN) 800 MG tablet for headache Stop taking Aspirin, vitamins and herbal medications. Do not take any NSAIDs ie: Ibuprofen, Advil, Naproxen or any medication containing Aspirin.  Do not wear jewelry, make-up or nail polish.  Do not wear lotions, powders, or perfumes. You may wear deodorant.  Do not shave 48 hours prior to surgery. Men may shave face and neck.  Do not bring valuables to the hospital.  [facility] is not responsible for any belongings or valuables.               Contacts, dentures or bridgework may not be worn into surgery.  Leave suitcase in the car. After surgery it may be brought to your room.  For patients admitted to the hospital, discharge time is determined by your treatment team.               Patients discharged the day of surgery will not be allowed to drive home.  Name and phone number of your driver:  Special Instructions:  Special Instructions:Special Instructions: [facility] - Preparing for Surgery  Before surgery, you can play an important role.  Because skin is not sterile, your skin needs to be as free of germs as possible.  You can reduce the number of germs on you skin by washing with CHG (chlorahexidine gluconate) soap before surgery.  CHG is an antiseptic cleaner which kills germs and bonds with the skin to continue killing germs even after washing.  Please DO NOT use if you have an allergy to CHG or antibacterial soaps.  If your skin becomes  reddened/irritated stop using the CHG and inform your nurse when you arrive at Short Stay.  Do not shave (including legs and underarms) for at least 48 hours prior to the first CHG shower.  You may shave your face.  Please follow these instructions carefully:   1.  Shower with CHG Soap the night before surgery and the morning of Surgery.  2.  If you choose to wash your hair, wash your hair first as usual with your normal shampoo.  3.  After you shampoo, rinse your hair and body thoroughly to remove the Shampoo.  4.  Use CHG as you would any other liquid soap.  You can apply chg directly  to the skin and wash gently with scrungie or a clean washcloth.  5.  Apply the CHG Soap to your body ONLY FROM THE NECK DOWN.  Do not use on open wounds or open sores.  Avoid contact with your eyes, ears, mouth and genitals (private parts).  Wash genitals (private parts) with your normal soap.  6.  Wash thoroughly, paying special attention to the area where your surgery will be performed.  7.  Thoroughly rinse your body with warm water from the neck down.  8.  DO NOT shower/wash with your normal soap after using and rinsing off the CHG Soap.  9.  Pat yourself dry with a clean towel.  10.  Wear clean pajamas.            11.  Place clean sheets on your bed the night of your first shower and do not sleep with pets.  Day of Surgery  Do not apply any lotions/deodorants the morning of surgery.  Please wear clean clothes to the hospital/surgery center.   Please read over the following fact sheets that you were given: Pain Booklet, Coughing and Deep Breathing and Surgical Site Infection Prevention

## 2013-07-25 ENCOUNTER — Encounter (HOSPITAL_COMMUNITY): Payer: Self-pay | Admitting: Anesthesiology

## 2013-07-25 ENCOUNTER — Ambulatory Visit (HOSPITAL_COMMUNITY): Payer: Medicare Other

## 2013-07-25 ENCOUNTER — Inpatient Hospital Stay (HOSPITAL_COMMUNITY)
Admission: RE | Admit: 2013-07-25 | Discharge: 2013-07-30 | DRG: 418 | Disposition: A | Discharge status: Skilled Nursing Facility | Payer: Medicare Other | Source: Ambulatory Visit | Attending: General Surgery | Admitting: General Surgery

## 2013-07-25 ENCOUNTER — Encounter (HOSPITAL_COMMUNITY)
Admission: RE | Disposition: A | Discharge status: Skilled Nursing Facility | Payer: Self-pay | Source: Ambulatory Visit | Attending: General Surgery

## 2013-07-25 ENCOUNTER — Ambulatory Visit (HOSPITAL_COMMUNITY): Payer: Medicare Other | Admitting: Anesthesiology

## 2013-07-25 ENCOUNTER — Encounter (HOSPITAL_COMMUNITY): Payer: Medicare Other | Admitting: Vascular Surgery

## 2013-07-25 DIAGNOSIS — I498 Other specified cardiac arrhythmias: Secondary | ICD-10-CM | POA: Diagnosis not present

## 2013-07-25 DIAGNOSIS — Z8249 Family history of ischemic heart disease and other diseases of the circulatory system: Secondary | ICD-10-CM

## 2013-07-25 DIAGNOSIS — R5381 Other malaise: Secondary | ICD-10-CM | POA: Diagnosis not present

## 2013-07-25 DIAGNOSIS — D649 Anemia, unspecified: Secondary | ICD-10-CM | POA: Diagnosis present

## 2013-07-25 DIAGNOSIS — E1169 Type 2 diabetes mellitus with other specified complication: Secondary | ICD-10-CM | POA: Diagnosis not present

## 2013-07-25 DIAGNOSIS — R531 Weakness: Secondary | ICD-10-CM

## 2013-07-25 DIAGNOSIS — N390 Urinary tract infection, site not specified: Secondary | ICD-10-CM | POA: Diagnosis not present

## 2013-07-25 DIAGNOSIS — M1990 Unspecified osteoarthritis, unspecified site: Secondary | ICD-10-CM | POA: Diagnosis present

## 2013-07-25 DIAGNOSIS — N400 Enlarged prostate without lower urinary tract symptoms: Secondary | ICD-10-CM | POA: Diagnosis present

## 2013-07-25 DIAGNOSIS — K8066 Calculus of gallbladder and bile duct with acute and chronic cholecystitis without obstruction: Secondary | ICD-10-CM

## 2013-07-25 DIAGNOSIS — J9819 Other pulmonary collapse: Secondary | ICD-10-CM | POA: Diagnosis not present

## 2013-07-25 DIAGNOSIS — G589 Mononeuropathy, unspecified: Secondary | ICD-10-CM | POA: Diagnosis present

## 2013-07-25 DIAGNOSIS — M129 Arthropathy, unspecified: Secondary | ICD-10-CM | POA: Diagnosis present

## 2013-07-25 DIAGNOSIS — I69998 Other sequelae following unspecified cerebrovascular disease: Secondary | ICD-10-CM

## 2013-07-25 DIAGNOSIS — IMO0002 Reserved for concepts with insufficient information to code with codable children: Secondary | ICD-10-CM

## 2013-07-25 DIAGNOSIS — I10 Essential (primary) hypertension: Secondary | ICD-10-CM | POA: Diagnosis present

## 2013-07-25 DIAGNOSIS — E119 Type 2 diabetes mellitus without complications: Secondary | ICD-10-CM | POA: Diagnosis present

## 2013-07-25 DIAGNOSIS — R29898 Other symptoms and signs involving the musculoskeletal system: Secondary | ICD-10-CM | POA: Diagnosis present

## 2013-07-25 DIAGNOSIS — K8010 Calculus of gallbladder with chronic cholecystitis without obstruction: Secondary | ICD-10-CM | POA: Diagnosis present

## 2013-07-25 DIAGNOSIS — Z9049 Acquired absence of other specified parts of digestive tract: Secondary | ICD-10-CM

## 2013-07-25 DIAGNOSIS — K660 Peritoneal adhesions (postprocedural) (postinfection): Secondary | ICD-10-CM | POA: Diagnosis present

## 2013-07-25 DIAGNOSIS — K560 Paralytic ileus: Secondary | ICD-10-CM | POA: Diagnosis present

## 2013-07-25 DIAGNOSIS — Z8612 Personal history of poliomyelitis: Secondary | ICD-10-CM

## 2013-07-25 DIAGNOSIS — K810 Acute cholecystitis: Secondary | ICD-10-CM | POA: Diagnosis present

## 2013-07-25 DIAGNOSIS — Z79899 Other long term (current) drug therapy: Secondary | ICD-10-CM

## 2013-07-25 DIAGNOSIS — Z833 Family history of diabetes mellitus: Secondary | ICD-10-CM

## 2013-07-25 DIAGNOSIS — Z794 Long term (current) use of insulin: Secondary | ICD-10-CM

## 2013-07-25 DIAGNOSIS — I639 Cerebral infarction, unspecified: Secondary | ICD-10-CM | POA: Diagnosis present

## 2013-07-25 DIAGNOSIS — Z7982 Long term (current) use of aspirin: Secondary | ICD-10-CM

## 2013-07-25 HISTORY — DX: Personal history of other diseases of the digestive system: Z87.19

## 2013-07-25 HISTORY — PX: LAPAROSCOPIC CHOLECYSTECTOMY: SUR755

## 2013-07-25 HISTORY — DX: Shortness of breath: R06.02

## 2013-07-25 HISTORY — DX: Personal history of peptic ulcer disease: Z87.11

## 2013-07-25 HISTORY — PX: CHOLECYSTECTOMY: SHX55

## 2013-07-25 HISTORY — DX: Type 2 diabetes mellitus without complications: E11.9

## 2013-07-25 LAB — GLUCOSE, CAPILLARY
GLUCOSE-CAPILLARY: 131 mg/dL — AB (ref 70–99)
Glucose-Capillary: 90 mg/dL (ref 70–99)
Glucose-Capillary: 169 mg/dL — ABNORMAL HIGH (ref 70–99)
Glucose-Capillary: 165 mg/dL — ABNORMAL HIGH (ref 70–99)
Glucose-Capillary: 173 mg/dL — ABNORMAL HIGH (ref 70–99)

## 2013-07-25 LAB — HEMOGLOBIN A1C
Hgb A1c MFr Bld: 7.1 % — ABNORMAL HIGH (ref <5.7)
MEAN PLASMA GLUCOSE: 157 mg/dL — AB (ref <117)

## 2013-07-25 SURGERY — LAPAROSCOPIC CHOLECYSTECTOMY WITH INTRAOPERATIVE CHOLANGIOGRAM
Anesthesia: General | Site: Abdomen

## 2013-07-25 MED ORDER — ATROPINE SULFATE 1 MG/ML IJ SOLN
INTRAMUSCULAR | Status: DC | PRN
  Administered 2013-07-25: .2 mg via INTRAVENOUS

## 2013-07-25 MED ORDER — HYDROMORPHONE HCL PF 1 MG/ML IJ SOLN
0.2500 mg | INTRAMUSCULAR | Status: DC | PRN
  Administered 2013-07-25 (×2): 0.5 mg via INTRAVENOUS
  Administered 2013-07-25: 0.25 mg via INTRAVENOUS

## 2013-07-25 MED ORDER — NEOSTIGMINE METHYLSULFATE 1 MG/ML IJ SOLN
INTRAMUSCULAR | Status: DC | PRN
  Administered 2013-07-25: 4 mg via INTRAVENOUS

## 2013-07-25 MED ORDER — POLYETHYLENE GLYCOL 3350 17 G PO PACK
17.0000 g | PACK | Freq: Three times a day (TID) | ORAL | Status: DC
  Administered 2013-07-25 – 2013-07-27 (×8): 17 g via ORAL
  Filled 2013-07-25 (×11): qty 1

## 2013-07-25 MED ORDER — CEFAZOLIN SODIUM-DEXTROSE 2-3 GM-% IV SOLR
2.0000 g | Freq: Three times a day (TID) | INTRAVENOUS | Status: AC
  Administered 2013-07-25 – 2013-07-26 (×3): 2 g via INTRAVENOUS
  Filled 2013-07-25 (×3): qty 50

## 2013-07-25 MED ORDER — BUPIVACAINE-EPINEPHRINE 0.25% -1:200000 IJ SOLN
INTRAMUSCULAR | Status: DC | PRN
  Administered 2013-07-25: 15 mL

## 2013-07-25 MED ORDER — EPHEDRINE SULFATE 50 MG/ML IJ SOLN
INTRAMUSCULAR | Status: AC
  Filled 2013-07-25: qty 1

## 2013-07-25 MED ORDER — LIDOCAINE HCL (CARDIAC) 20 MG/ML IV SOLN
INTRAVENOUS | Status: DC | PRN
  Administered 2013-07-25: 60 mg via INTRAVENOUS

## 2013-07-25 MED ORDER — FENTANYL CITRATE 0.05 MG/ML IJ SOLN
INTRAMUSCULAR | Status: DC | PRN
  Administered 2013-07-25: 150 ug via INTRAVENOUS
  Administered 2013-07-25 (×2): 50 ug via INTRAVENOUS

## 2013-07-25 MED ORDER — EPHEDRINE SULFATE 50 MG/ML IJ SOLN
INTRAMUSCULAR | Status: DC | PRN
  Administered 2013-07-25: 15 mg via INTRAVENOUS
  Administered 2013-07-25: 10 mg via INTRAVENOUS

## 2013-07-25 MED ORDER — INSULIN ASPART 100 UNIT/ML ~~LOC~~ SOLN
0.0000 [IU] | Freq: Three times a day (TID) | SUBCUTANEOUS | Status: DC
  Administered 2013-07-25: 2 [IU] via SUBCUTANEOUS

## 2013-07-25 MED ORDER — FENTANYL CITRATE 0.05 MG/ML IJ SOLN
25.0000 ug | INTRAMUSCULAR | Status: DC | PRN
  Administered 2013-07-25 – 2013-07-28 (×8): 50 ug via INTRAVENOUS
  Filled 2013-07-25 (×8): qty 2

## 2013-07-25 MED ORDER — METOCLOPRAMIDE HCL 5 MG/ML IJ SOLN
INTRAMUSCULAR | Status: DC | PRN
  Administered 2013-07-25: 5 mg via INTRAVENOUS

## 2013-07-25 MED ORDER — BUPIVACAINE-EPINEPHRINE (PF) 0.25% -1:200000 IJ SOLN
INTRAMUSCULAR | Status: AC
  Filled 2013-07-25: qty 30

## 2013-07-25 MED ORDER — SACCHAROMYCES BOULARDII 250 MG PO CAPS
250.0000 mg | ORAL_CAPSULE | Freq: Two times a day (BID) | ORAL | Status: DC
  Administered 2013-07-25 – 2013-07-30 (×11): 250 mg via ORAL
  Filled 2013-07-25 (×13): qty 1

## 2013-07-25 MED ORDER — ROCURONIUM BROMIDE 100 MG/10ML IV SOLN
INTRAVENOUS | Status: DC | PRN
  Administered 2013-07-25: 5 mg via INTRAVENOUS
  Administered 2013-07-25: 35 mg via INTRAVENOUS

## 2013-07-25 MED ORDER — GLYCOPYRROLATE 0.2 MG/ML IJ SOLN
INTRAMUSCULAR | Status: DC | PRN
  Administered 2013-07-25: .8 mg via INTRAVENOUS

## 2013-07-25 MED ORDER — INSULIN DETEMIR 100 UNIT/ML FLEXPEN: 50.0000 [IU] | PEN_INJECTOR | Freq: Two times a day (BID) | SUBCUTANEOUS | Status: DC

## 2013-07-25 MED ORDER — DEXAMETHASONE SODIUM PHOSPHATE 10 MG/ML IJ SOLN
INTRAMUSCULAR | Status: DC | PRN
  Administered 2013-07-25: 4 mg via INTRAVENOUS

## 2013-07-25 MED ORDER — SODIUM CHLORIDE 0.9 % IJ SOLN
INTRAMUSCULAR | Status: AC
  Filled 2013-07-25: qty 10

## 2013-07-25 MED ORDER — LACTATED RINGERS IV SOLN
INTRAVENOUS | Status: DC | PRN
  Administered 2013-07-25 (×2): via INTRAVENOUS

## 2013-07-25 MED ORDER — OXYCODONE HCL 5 MG PO TABS: 5.0000 mg | ORAL_TABLET | Freq: Once | ORAL | Status: DC | PRN

## 2013-07-25 MED ORDER — METOCLOPRAMIDE HCL 5 MG/ML IJ SOLN: 10.0000 mg | Freq: Once | INTRAMUSCULAR | Status: DC | PRN

## 2013-07-25 MED ORDER — ONDANSETRON HCL 4 MG/2ML IJ SOLN
INTRAMUSCULAR | Status: DC | PRN
  Administered 2013-07-25: 4 mg via INTRAVENOUS

## 2013-07-25 MED ORDER — DARIFENACIN HYDROBROMIDE ER 7.5 MG PO TB24
7.5000 mg | ORAL_TABLET | Freq: Every day | ORAL | Status: DC
  Administered 2013-07-25 – 2013-07-30 (×6): 7.5 mg via ORAL
  Filled 2013-07-25 (×6): qty 1

## 2013-07-25 MED ORDER — TAMSULOSIN HCL 0.4 MG PO CAPS
0.4000 mg | ORAL_CAPSULE | Freq: Two times a day (BID) | ORAL | Status: DC
  Administered 2013-07-25 – 2013-07-30 (×10): 0.4 mg via ORAL
  Filled 2013-07-25 (×12): qty 1

## 2013-07-25 MED ORDER — PROPOFOL 10 MG/ML IV BOLUS
INTRAVENOUS | Status: AC
Start: 2013-07-25 — End: 2013-07-25
  Filled 2013-07-25: qty 20

## 2013-07-25 MED ORDER — ENOXAPARIN SODIUM 40 MG/0.4ML ~~LOC~~ SOLN
40.0000 mg | SUBCUTANEOUS | Status: DC
  Administered 2013-07-26 – 2013-07-30 (×5): 40 mg via SUBCUTANEOUS
  Filled 2013-07-25 (×7): qty 0.4

## 2013-07-25 MED ORDER — IOHEXOL 300 MG/ML  SOLN
INTRAMUSCULAR | Status: DC | PRN
  Administered 2013-07-25: 08:00:00

## 2013-07-25 MED ORDER — NEOSTIGMINE METHYLSULFATE 1 MG/ML IJ SOLN
INTRAMUSCULAR | Status: AC
  Filled 2013-07-25: qty 10

## 2013-07-25 MED ORDER — GABAPENTIN 800 MG PO TABS
800.0000 mg | ORAL_TABLET | Freq: Three times a day (TID) | ORAL | Status: DC | PRN
  Filled 2013-07-25: qty 1

## 2013-07-25 MED ORDER — HEMOSTATIC AGENTS (NO CHARGE) OPTIME
TOPICAL | Status: DC | PRN
  Administered 2013-07-25 (×2): 1 via TOPICAL

## 2013-07-25 MED ORDER — PHENYLEPHRINE HCL 10 MG/ML IJ SOLN
INTRAMUSCULAR | Status: DC | PRN
  Administered 2013-07-25 (×4): 80 ug via INTRAVENOUS

## 2013-07-25 MED ORDER — ONDANSETRON HCL 4 MG PO TABS: 4.0000 mg | ORAL_TABLET | Freq: Four times a day (QID) | ORAL | Status: DC | PRN

## 2013-07-25 MED ORDER — SODIUM CHLORIDE 0.9 % IR SOLN
Status: DC | PRN
  Administered 2013-07-25: 1000 mL

## 2013-07-25 MED ORDER — GLYCOPYRROLATE 0.2 MG/ML IJ SOLN
INTRAMUSCULAR | Status: AC
  Filled 2013-07-25: qty 4

## 2013-07-25 MED ORDER — ONDANSETRON HCL 4 MG/2ML IJ SOLN
INTRAMUSCULAR | Status: AC
  Filled 2013-07-25: qty 2

## 2013-07-25 MED ORDER — INSULIN DETEMIR 100 UNIT/ML ~~LOC~~ SOLN
50.0000 [IU] | Freq: Two times a day (BID) | SUBCUTANEOUS | Status: DC
  Administered 2013-07-25 (×2): 50 [IU] via SUBCUTANEOUS
  Filled 2013-07-25 (×4): qty 0.5

## 2013-07-25 MED ORDER — FENTANYL CITRATE 0.05 MG/ML IJ SOLN
INTRAMUSCULAR | Status: AC
  Filled 2013-07-25: qty 5

## 2013-07-25 MED ORDER — AMLODIPINE BESYLATE 5 MG PO TABS
5.0000 mg | ORAL_TABLET | Freq: Every morning | ORAL | Status: DC
  Administered 2013-07-26 – 2013-07-28 (×3): 5 mg via ORAL
  Filled 2013-07-25 (×3): qty 1

## 2013-07-25 MED ORDER — DOCUSATE SODIUM 100 MG PO CAPS
100.0000 mg | ORAL_CAPSULE | Freq: Two times a day (BID) | ORAL | Status: DC
  Administered 2013-07-25 – 2013-07-30 (×11): 100 mg via ORAL
  Filled 2013-07-25 (×11): qty 1

## 2013-07-25 MED ORDER — ONDANSETRON HCL 4 MG/2ML IJ SOLN
4.0000 mg | Freq: Four times a day (QID) | INTRAMUSCULAR | Status: DC | PRN
  Administered 2013-07-26 – 2013-07-29 (×2): 4 mg via INTRAVENOUS
  Filled 2013-07-25 (×2): qty 2

## 2013-07-25 MED ORDER — DEXAMETHASONE SODIUM PHOSPHATE 4 MG/ML IJ SOLN
INTRAMUSCULAR | Status: AC
  Filled 2013-07-25: qty 1

## 2013-07-25 MED ORDER — CHLORHEXIDINE GLUCONATE 4 % EX LIQD: 1.0000 "application " | Freq: Once | CUTANEOUS | Status: DC

## 2013-07-25 MED ORDER — ARTIFICIAL TEARS OP OINT
TOPICAL_OINTMENT | OPHTHALMIC | Status: DC | PRN
  Administered 2013-07-25: 1 via OPHTHALMIC

## 2013-07-25 MED ORDER — METOCLOPRAMIDE HCL 5 MG/ML IJ SOLN
INTRAMUSCULAR | Status: AC
  Filled 2013-07-25: qty 2

## 2013-07-25 MED ORDER — ATROPINE SULFATE 0.1 MG/ML IJ SOLN
INTRAMUSCULAR | Status: AC
  Filled 2013-07-25: qty 10

## 2013-07-25 MED ORDER — ATORVASTATIN CALCIUM 20 MG PO TABS
20.0000 mg | ORAL_TABLET | Freq: Every evening | ORAL | Status: DC
  Administered 2013-07-25 – 2013-07-30 (×6): 20 mg via ORAL
  Filled 2013-07-25 (×7): qty 1

## 2013-07-25 MED ORDER — PROPOFOL 10 MG/ML IV BOLUS
INTRAVENOUS | Status: DC | PRN
  Administered 2013-07-25: 150 mg via INTRAVENOUS

## 2013-07-25 MED ORDER — LATANOPROST 0.005 % OP SOLN
1.0000 [drp] | Freq: Every day | OPHTHALMIC | Status: DC
  Administered 2013-07-25 – 2013-07-29 (×5): 1 [drp] via OPHTHALMIC
  Filled 2013-07-25 (×2): qty 2.5

## 2013-07-25 MED ORDER — PREDNISONE 5 MG PO TABS
5.0000 mg | ORAL_TABLET | Freq: Every day | ORAL | Status: DC
  Administered 2013-07-26 – 2013-07-30 (×5): 5 mg via ORAL
  Filled 2013-07-25 (×6): qty 1

## 2013-07-25 MED ORDER — IRBESARTAN 300 MG PO TABS
300.0000 mg | ORAL_TABLET | Freq: Every day | ORAL | Status: DC
  Administered 2013-07-25 – 2013-07-29 (×5): 300 mg via ORAL
  Filled 2013-07-25 (×8): qty 1

## 2013-07-25 MED ORDER — HYDROMORPHONE HCL PF 1 MG/ML IJ SOLN
INTRAMUSCULAR | Status: AC
  Filled 2013-07-25: qty 1

## 2013-07-25 MED ORDER — HYDROCODONE-ACETAMINOPHEN 5-325 MG PO TABS
1.0000 | ORAL_TABLET | ORAL | Status: DC | PRN
  Administered 2013-07-25 – 2013-07-28 (×9): 2 via ORAL
  Filled 2013-07-25 (×10): qty 2

## 2013-07-25 MED ORDER — OXYCODONE HCL 5 MG/5ML PO SOLN: 5.0000 mg | Freq: Once | ORAL | Status: DC | PRN

## 2013-07-25 SURGICAL SUPPLY — 49 items
APPLIER CLIP ROT 10 11.4 M/L (STAPLE) ×3
BLADE SURG ROTATE 9660 (MISCELLANEOUS) ×3 IMPLANT
CANISTER SUCTION 2500CC (MISCELLANEOUS) ×3 IMPLANT
CHLORAPREP W/TINT 26ML (MISCELLANEOUS) ×3 IMPLANT
CLIP APPLIE ROT 10 11.4 M/L (STAPLE) ×1 IMPLANT
COVER MAYO STAND STRL (DRAPES) ×3 IMPLANT
COVER SURGICAL LIGHT HANDLE (MISCELLANEOUS) ×3 IMPLANT
DECANTER SPIKE VIAL GLASS SM (MISCELLANEOUS) IMPLANT
DERMABOND ADVANCED (GAUZE/BANDAGES/DRESSINGS) ×2
DERMABOND ADVANCED .7 DNX12 (GAUZE/BANDAGES/DRESSINGS) ×1 IMPLANT
DEVICE TROCAR PUNCTURE CLOSURE (ENDOMECHANICALS) ×3 IMPLANT
DRAPE C-ARM 42X72 X-RAY (DRAPES) ×3 IMPLANT
DRAPE UTILITY 15X26 W/TAPE STR (DRAPE) ×6 IMPLANT
ELECT REM PT RETURN 9FT ADLT (ELECTROSURGICAL) ×3
ELECTRODE REM PT RTRN 9FT ADLT (ELECTROSURGICAL) ×1 IMPLANT
EVACUATOR SILICONE 100CC (DRAIN) ×3 IMPLANT
FILTER SMOKE EVAC LAPAROSHD (FILTER) ×3 IMPLANT
GLOVE BIO SURGEON STRL SZ8 (GLOVE) ×3 IMPLANT
GLOVE BIOGEL PI IND STRL 7.0 (GLOVE) ×1 IMPLANT
GLOVE BIOGEL PI IND STRL 8 (GLOVE) ×2 IMPLANT
GLOVE BIOGEL PI INDICATOR 7.0 (GLOVE) ×2
GLOVE BIOGEL PI INDICATOR 8 (GLOVE) ×4
GLOVE ECLIPSE 8.0 STRL XLNG CF (GLOVE) ×3 IMPLANT
GLOVE EUDERMIC 7 POWDERFREE (GLOVE) ×3 IMPLANT
GLOVE SURG SS PI 7.0 STRL IVOR (GLOVE) ×3 IMPLANT
GOWN STRL NON-REIN LRG LVL3 (GOWN DISPOSABLE) ×12 IMPLANT
GOWN STRL REIN XL XLG (GOWN DISPOSABLE) ×6 IMPLANT
HEMOSTAT SNOW SURGICEL 2X4 (HEMOSTASIS) ×6 IMPLANT
KIT BASIN OR (CUSTOM PROCEDURE TRAY) ×3 IMPLANT
KIT ROOM TURNOVER OR (KITS) ×3 IMPLANT
NS IRRIG 1000ML POUR BTL (IV SOLUTION) ×3 IMPLANT
PAD ARMBOARD 7.5X6 YLW CONV (MISCELLANEOUS) ×3 IMPLANT
POUCH SPECIMEN RETRIEVAL 10MM (ENDOMECHANICALS) ×3 IMPLANT
SCISSORS LAP 5X35 DISP (ENDOMECHANICALS) ×3 IMPLANT
SET CHOLANGIOGRAPH 5 50 .035 (SET/KITS/TRAYS/PACK) ×3 IMPLANT
SET IRRIG TUBING LAPAROSCOPIC (IRRIGATION / IRRIGATOR) ×6 IMPLANT
SLEEVE ENDOPATH XCEL 5M (ENDOMECHANICALS) ×6 IMPLANT
SPECIMEN JAR SMALL (MISCELLANEOUS) ×3 IMPLANT
SPONGE GAUZE 4X4 12PLY (GAUZE/BANDAGES/DRESSINGS) ×3 IMPLANT
SUT ETHILON 2 0 FS 18 (SUTURE) ×3 IMPLANT
SUT MNCRL AB 4-0 PS2 18 (SUTURE) ×6 IMPLANT
SUT VICRYL 0 UR6 27IN ABS (SUTURE) ×3 IMPLANT
TOWEL OR 17X24 6PK STRL BLUE (TOWEL DISPOSABLE) ×3 IMPLANT
TOWEL OR 17X26 10 PK STRL BLUE (TOWEL DISPOSABLE) ×3 IMPLANT
TOWEL OR NON WOVEN STRL DISP B (DISPOSABLE) ×3 IMPLANT
TRAY LAPAROSCOPIC (CUSTOM PROCEDURE TRAY) ×3 IMPLANT
TROCAR XCEL BLUNT TIP 100MML (ENDOMECHANICALS) ×3 IMPLANT
TROCAR XCEL NON-BLD 11X100MML (ENDOMECHANICALS) ×6 IMPLANT
TROCAR XCEL NON-BLD 5MMX100MML (ENDOMECHANICALS) ×3 IMPLANT

## 2013-07-25 NOTE — Anesthesia Preprocedure Evaluation (Addendum)
Anesthesia Evaluation  Patient identified by MRN, date of birth, ID band Patient awake    Reviewed: Allergy & Precautions, H&P , NPO status , Patient's Chart, lab work & pertinent test results, reviewed documented beta blocker date and time   Airway Mallampati: II TM Distance: >3 FB Neck ROM: full    Dental  (+) Edentulous Upper, Edentulous Lower and Dental Advidsory Given   Pulmonary shortness of breath,  breath sounds clear to auscultation        Cardiovascular hypertension, On Medications Rhythm:regular     Neuro/Psych  Headaches, CVA, Residual Symptoms negative psych ROS   GI/Hepatic negative GI ROS, Neg liver ROS,   Endo/Other  diabetes, Insulin Dependent  Renal/GU negative Renal ROS  negative genitourinary   Musculoskeletal   Abdominal   Peds  Hematology  (+) anemia ,   Anesthesia Other Findings See surgeon's H&P   Reproductive/Obstetrics negative OB ROS                         Anesthesia Physical Anesthesia Plan  ASA: III  Anesthesia Plan: General   Post-op Pain Management:    Induction: Intravenous  Airway Management Planned: Oral ETT  Additional Equipment:   Intra-op Plan:   Post-operative Plan: Extubation in OR  Informed Consent: I have reviewed the patients History and Physical, chart, labs and discussed the procedure including the risks, benefits and alternatives for the proposed anesthesia with the patient or authorized representative who has indicated his/her understanding and acceptance.   Dental Advisory Given  Plan Discussed with: CRNA and Surgeon  Anesthesia Plan Comments:         Anesthesia Quick Evaluation

## 2013-07-25 NOTE — Anesthesia Procedure Notes (Signed)
Procedure Name: Intubation Date/Time: 07/25/2013 7:29 AM Performed by: Neldon Newport Pre-anesthesia Checklist: Patient identified, Timeout performed, Emergency Drugs available, Suction available and Patient being monitored Patient Re-evaluated:Patient Re-evaluated prior to inductionOxygen Delivery Method: Circle system utilized Preoxygenation: Pre-oxygenation with 100% oxygen Intubation Type: IV induction Ventilation: Mask ventilation without difficulty Laryngoscope Size: Mac and 3 Grade View: Grade II Tube type: Oral Tube size: 7.5 mm Number of attempts: 1 Placement Confirmation: positive ETCO2,  breath sounds checked- equal and bilateral and ETT inserted through vocal cords under direct vision Secured at: 23 cm Tube secured with: Tape Dental Injury: Teeth and Oropharynx as per pre-operative assessment

## 2013-07-25 NOTE — Op Note (Signed)
Patient Name:           Mike Mack   Date of Surgery:        07/25/2013  Pre op Diagnosis:      Chronic cholecystitis with cholelithiasis  Post op Diagnosis:    Same  Procedure:                 Laparoscopic lysis of adhesions requiring 20 minutes, Laparoscopic cholecystectomy with intraoperative cholangiogram  Surgeon:                     Edsel Petrin. Dalbert Batman, M.D., FACS  Assistant:                      Dr. Zella Richer, M.D., FACS  Operative Indications:   Mike Mack is a 78 y.o. male.   This gentleman was hospitalized on 05/13/2013 with back pain, enterococcus UTI, constipation, abdominal pain, abdominal distention. Workup showed a WBC of 18,900, normal liver function tests, normal lipase. CT scan showed a huge stool burden, distention of the small bowel, distention of the gallbladder, gallbladder wall thickening, and gallstones. He was tender locally in the right upper quadrant. Chest x-ray showed basilar atelectasis and possible pneumonia. Because of the complex of the of his medical problems and abdominal distention we elected to treat his presumed acute cholecystitis with cholelithiasis with a percutaneous drain and antibiotics. He improved and resumed bowel function and diet and was discharged on 05/19/2013. Since that time his medical condition has stabilized. He has no pain. Marland Kitchen He enjoys normal diet and normal bowel function. He saw Dr. Wilson Singer and was told that his medical conditions are stable.  He went for a drain study but the pigtail catheter had become dislodged and was external to the gallbladder, and so the drain was removed. He remains stable. Hasn't had any more attacks of abdominal pain. Tolerating diet. He ambulates with a cane some days and a walker other days. He is here today with his wife.  He has numerous comorbidities including insulin-dependent diabetes mellitus, hypertension, history of stroke with persistent right-sided weakness, ambulates with a walker, recent  enterococcus UTI, remote history of polio history of right inguinal hernia repair, history of exploratory laparotomy with lysis of adhesions in 2002 for SBO([facility])    Operative Findings:       The patient had significant chronic intra-abdominal adhesions in the midline related to previous laparotomy for bowel obstruction. These were relatively soft and were taken down without any injury to the intestine. The gallbladder was severely chronically inflamed. Very thick wall. Dense fibrotic adherence to the gallbladder bed. Very thickened peritoneum overlying it. The intraoperative cholangiogram was normal, showing normal intrahepatic and extrahepatic biliary anatomy, no filling defect, and no obstruction was good flow of contrast into the duodenum. The liver otherwise looked healthy except for adhesions in the right dome where the drainage catheter had been. There were no other gross abnormalities of the stomach duodenum small intestine and large intestine.  Procedure in Detail:          Following the induction of general endotracheal anesthesia the patient's abdomen was prepped and draped in a sterile fashion. Intravenous antibiotics were given. Surgical time out was performed. 0.5% Marcaine with epinephrine was used as a local infiltration anesthetic. A 5 mm trocar was placed in the left subcostal region with optical entry technique. This was uneventful. We immediately noticed the adhesions in the midline extending almost all the way up to the  falciform ligament. I was able to pass the camera under the falciform  ligament and the right upper quadrant did not have a great many adhesions. Two 5 mm trochars were placed in the right upper quadrant under direct vision. Through the right upper quadrant trocars using blunt and sharp dissection I took all the adhesions down to the level of the umbilicus. This was uneventful. A placed a 12 mm trocar in the midline just above the umbilicus through the old scar, 11 mm  trocar in the subxiphoid region. We slowly took the adhesions down away from the gallbladder. We took the dissection down until we could identify the infundibulum. The peritoneum was thickened and leathery and had to dissect this very slowly. Ultimately we were able to isolate the cystic duct, insert a cholangiocatheter and performed a cholangiogram. The cholangiogram was normal as described above. We removed the cholangiocatheter, secured the cystic duct with multiple metal clips and divided. We isolated the cystic artery as it went onto the wall of the gallbladder, secured it with hemoclips and divided. We then very slowly dissected the gallbladder from its bed with electrocautery and blunt dissection. This dissection had to go very slowly because of intense chronic fibrosis. We did enter the gallbladder lumen once and spilled a few stones which were retrieved we got  into the liver and a couple of places and bleeding was controlled with cautery and Snow hemostatic sponge. The gallbladder was placed in a specimen bag and removed. We spent a fair amount of time irrigating and washing things out. We placed a large piece of Snow  hemostatic sponge in the bed of the gallbladder and hemostasis was good. The clips on the cystic duct and cystic artery looked good. I chose to place a 5 [place] Blake drain in subhepatic space and brought out through a right lateral trocar site, sutured to the skin with nylon suture and connected to suction bulb. There did not appear to be much fluid coming out the drain at the end of the case. We reinspected all the operative areas, the omentum, the transverse colon and everything looked good. We removed all trocars and released the pneumoperitoneum. The fascia above the umbilicus was closed with 3 interrupted sutures of 0 Vicryl. The skin edges were closed with subcuticular sutures of 4-0 Monocryl and Dermabond. Patient tolerated procedure well taken to recovery in stable condition. EBL  50-75 cc. Counts correct. Consultations none.     Edsel Petrin. Dalbert Batman, M.D., FACS General and Minimally Invasive Surgery Breast and Colorectal Surgery  07/25/2013 9:39 AM

## 2013-07-25 NOTE — Preoperative (Signed)
Beta Blockers   Reason not to administer Beta Blockers:Not Applicable 

## 2013-07-25 NOTE — Interval H&P Note (Signed)
History and Physical Interval Note:  07/25/2013 6:50 AM  Mike Mack  has presented today for surgery, with the diagnosis of cholelithiasis   The goals and the  various methods of treatment have been discussed with the patient and family. After consideration of risks, benefits and other options for treatment, the patient has consented to  Procedure(s): LAPAROSCOPIC CHOLECYSTECTOMY WITH INTRAOPERATIVE CHOLANGIOGRAM (N/A) ,  POSSIBLE OPEN as a surgical intervention .  The patient's history has been reviewed, patient examined, no change in status, stable for surgery.  I have reviewed the patient's chart and labs.  Questions were answered to the patient's satisfaction.     [name]

## 2013-07-25 NOTE — Anesthesia Postprocedure Evaluation (Signed)
Anesthesia Post Note  Patient: Mike Mack  Procedure(s) Performed: Procedure(s) (LRB): LAPAROSCOPIC CHOLECYSTECTOMY WITH INTRAOPERATIVE CHOLANGIOGRAM (N/A)  Anesthesia type: General  Patient location: PACU  Post pain: Pain level controlled and Adequate analgesia  Post assessment: Post-op Vital signs reviewed, Patient's Cardiovascular Status Stable, Respiratory Function Stable, Patent Airway and Pain level controlled  Last Vitals:  Filed Vitals:   07/25/13 1015  BP: 155/63  Pulse: 87  Temp:   Resp: 17    Post vital signs: Reviewed and stable  Level of consciousness: awake, alert  and oriented  Complications: No apparent anesthesia complications

## 2013-07-25 NOTE — Progress Notes (Signed)
PT Cancellation Note  Patient Details Name: Mike Mack MRN: [id number] DOB: 1935/10/23   Cancelled Treatment:    Reason Eval/Treat Not Completed: Other (comment); noted pt s/p lap chole this am.  Will complete PT eval tomorrow.    Nerea Bordenave,CYNDI 07/25/2013, 1:46 PM

## 2013-07-25 NOTE — Transfer of Care (Signed)
Immediate Anesthesia Transfer of Care Note  Patient: Mike Mack  Procedure(s) Performed: Procedure(s): LAPAROSCOPIC CHOLECYSTECTOMY WITH INTRAOPERATIVE CHOLANGIOGRAM (N/A)  Patient Location: PACU  Anesthesia Type:General  Level of Consciousness: awake  Airway & Oxygen Therapy: Patient Spontanous Breathing and Patient connected to face mask oxygen  Post-op Assessment: Report given to PACU RN, Post -op Vital signs reviewed and stable and Patient moving all extremities X 4  Post vital signs: Reviewed and stable  Complications: No apparent anesthesia complications

## 2013-07-26 LAB — BASIC METABOLIC PANEL
BUN: 22 mg/dL (ref 6–23)
CO2: 24 mEq/L (ref 19–32)
Calcium: 8.8 mg/dL (ref 8.4–10.5)
Chloride: 105 mEq/L (ref 96–112)
Creatinine, Ser: 1.23 mg/dL (ref 0.50–1.35)
GFR calc Af Amer: 65 mL/min — ABNORMAL LOW (ref >90)
GFR calc non Af Amer: 56 mL/min — ABNORMAL LOW (ref >90)
Glucose, Bld: 43 mg/dL — CL (ref 70–99)
Potassium: 4.4 mEq/L (ref 3.7–5.3)
Sodium: 142 mEq/L (ref 137–147)

## 2013-07-26 LAB — CBC
HCT: 32.5 % — ABNORMAL LOW (ref 39.0–52.0)
HEMOGLOBIN: 10.4 g/dL — AB (ref 13.0–17.0)
MCH: 27.7 pg (ref 26.0–34.0)
MCHC: 32 g/dL (ref 30.0–36.0)
MCV: 86.4 fL (ref 78.0–100.0)
Platelets: 303 10*3/uL (ref 150–400)
RBC: 3.76 MIL/uL — AB (ref 4.22–5.81)
RDW: 15.5 % (ref 11.5–15.5)
WBC: 14.4 10*3/uL — ABNORMAL HIGH (ref 4.0–10.5)

## 2013-07-26 LAB — GLUCOSE, CAPILLARY
GLUCOSE-CAPILLARY: 163 mg/dL — AB (ref 70–99)
Glucose-Capillary: 40 mg/dL — CL (ref 70–99)
Glucose-Capillary: 157 mg/dL — ABNORMAL HIGH (ref 70–99)
Glucose-Capillary: 149 mg/dL — ABNORMAL HIGH (ref 70–99)
Glucose-Capillary: 180 mg/dL — ABNORMAL HIGH (ref 70–99)
Glucose-Capillary: 171 mg/dL — ABNORMAL HIGH (ref 70–99)

## 2013-07-26 MED ORDER — POTASSIUM CHLORIDE 2 MEQ/ML IV SOLN
INTRAVENOUS | Status: DC
  Administered 2013-07-26 – 2013-07-27 (×2): via INTRAVENOUS
  Filled 2013-07-26 (×6): qty 1000

## 2013-07-26 MED ORDER — DEXTROSE 50 % IV SOLN
INTRAVENOUS | Status: AC
  Filled 2013-07-26: qty 50

## 2013-07-26 MED ORDER — DEXTROSE 50 % IV SOLN
25.0000 mL | Freq: Once | INTRAVENOUS | Status: AC | PRN
  Administered 2013-07-26: 25 mL via INTRAVENOUS

## 2013-07-26 MED ORDER — INSULIN ASPART 100 UNIT/ML ~~LOC~~ SOLN
0.0000 [IU] | SUBCUTANEOUS | Status: DC
  Administered 2013-07-26 (×3): 2 [IU] via SUBCUTANEOUS
  Administered 2013-07-26: 1 [IU] via SUBCUTANEOUS
  Administered 2013-07-27 (×3): 2 [IU] via SUBCUTANEOUS
  Administered 2013-07-27: 1 [IU] via SUBCUTANEOUS
  Administered 2013-07-27: 2 [IU] via SUBCUTANEOUS
  Administered 2013-07-27: 3 [IU] via SUBCUTANEOUS
  Administered 2013-07-28 (×2): 2 [IU] via SUBCUTANEOUS
  Administered 2013-07-28: 5 [IU] via SUBCUTANEOUS
  Administered 2013-07-28: 3 [IU] via SUBCUTANEOUS
  Administered 2013-07-28: 2 [IU] via SUBCUTANEOUS
  Administered 2013-07-29: 1 [IU] via SUBCUTANEOUS
  Administered 2013-07-29: 2 [IU] via SUBCUTANEOUS
  Administered 2013-07-29: 1 [IU] via SUBCUTANEOUS
  Administered 2013-07-29: 3 [IU] via SUBCUTANEOUS
  Administered 2013-07-29: 5 [IU] via SUBCUTANEOUS
  Administered 2013-07-30 (×2): 1 [IU] via SUBCUTANEOUS
  Administered 2013-07-30: 2 [IU] via SUBCUTANEOUS

## 2013-07-26 NOTE — Progress Notes (Signed)
PT Cancellation Note  Patient Details Name: Mike Mack MRN: [id number] DOB: [birth year]   Cancelled Treatment:    Reason Eval/Treat Not Completed: Medical issues which prohibited therapy (fever, per nsg hold PT)   Duncan Dull 07/26/2013, 2:19 PM Alben Deeds, [place] DPT  [phone]

## 2013-07-26 NOTE — Progress Notes (Signed)
NP made aware of increased temp. No new order will continue to monitor.

## 2013-07-26 NOTE — Progress Notes (Signed)
CRITICAL VALUE ALERT  Critical value received: cbg 43  Date of notification:  07/26/13  Time of notification: 0800  Critical value read back:y  Nurse who received alert:  Z Alani Lacivita  MD notified (1st page):  MD on call  Time of first page:  0840  MD notified (2nd page):  Time of second page:  Responding MD:  Marlou Starks  Time MD responded:  6060

## 2013-07-26 NOTE — Progress Notes (Signed)
Patient ID: Mike Mack, male   DOB: December 10, 1935, 78 y.o.   MRN: [id number] 1 Day Post-Op  Subjective: Pt having a lot of pain.  Passing a small amount of flatus, but mostly belching.  Nauseated.  Had 1 small episode of emesis this am.  Taking some clears.  Objective: Vital signs in last 24 hours: Temp:  [97.5 F (36.4 C)-99.5 F (37.5 C)] 99.3 F (37.4 C) (02/07 1005) Pulse Rate:  [105-118] 114 (02/07 1005) Resp:  [16-18] 17 (02/07 1005) BP: (140-183)/(66-85) 142/73 mmHg (02/07 1005) SpO2:  [96 %-100 %] 100 % (02/07 1005) Weight:  [151 lb 7.3 oz (68.7 kg)] 151 lb 7.3 oz (68.7 kg) (02/06 1700) Last BM Date: 07/24/13  Intake/Output from previous day: 02/06 0701 - 02/07 0700 In: 2100 [P.O.:600; I.V.:1500] Out: 1675 [Urine:1500; Drains:75; Blood:100] Intake/Output this shift:    PE: Abd: distended, hypoactive BS, diffusely tender, incisions, c/d/i, JP with thin serosang output Heart: tachy, but regular Lungs: Decreased BS, poor inspiratory effort, likely secondary to abdominal distention  Lab Results:   Recent Labs  07/24/13 1556 07/26/13 0600  WBC 7.8 14.4*  HGB 11.3* 10.4*  HCT 34.8* 32.5*  PLT 340 303   BMET  Recent Labs  07/24/13 1556 07/26/13 0600  NA 139 142  K 5.1 4.4  CL 102 105  CO2 24 24  GLUCOSE 202* 43*  BUN 25* 22  CREATININE 1.31 1.23  CALCIUM 9.2 8.8   PT/INR No results found for this basename: LABPROT, INR,  in the last 72 hours CMP     Component Value Date/Time   NA 142 07/26/2013 0600   K 4.4 07/26/2013 0600   CL 105 07/26/2013 0600   CO2 24 07/26/2013 0600   GLUCOSE 43* 07/26/2013 0600   BUN 22 07/26/2013 0600   CREATININE 1.23 07/26/2013 0600   CALCIUM 8.8 07/26/2013 0600   PROT 6.1 05/17/2013 0405   ALBUMIN 2.4* 05/17/2013 0405   AST 25 05/17/2013 0405   ALT 27 05/17/2013 0405   ALKPHOS 72 05/17/2013 0405   BILITOT 0.5 05/17/2013 0405   GFRNONAA 56* 07/26/2013 0600   GFRAA 65* 07/26/2013 0600   Lipase     Component Value Date/Time   LIPASE 9* 05/13/2013 0935       Studies/Results: Chest 2 View  07/24/2013   CLINICAL DATA:  Hypertension.  EXAM: CHEST  2 VIEW  COMPARISON:  None.  FINDINGS: Mediastinum and hilar structures are normal. The lungs are clear. Heart size normal. No pleural effusion or pneumothorax. Mild elevation left hemidiaphragm noted. Degenerative changes thoracic spine.  IMPRESSION: No active cardiopulmonary disease.   Electronically Signed   By: Marcello Moores  Register   On: 07/24/2013 16:47   Dg Cholangiogram Operative  07/25/2013   CLINICAL DATA:  History of acute cholecystitis with percutaneous cholecystostomy tube placement on 05/13/2013. Cholecystostomy tube be came inadvertently displaced. Patient is now a better surgical candidate and interval cholecystectomy and intraoperative cholangiogram are performed.  EXAM: INTRAOPERATIVE CHOLANGIOGRAM  TECHNIQUE: Cholangiographic images from the C-arm fluoroscopic device were submitted for interpretation post-operatively. Please see the procedural report for the amount of contrast and the fluoroscopy time utilized.  COMPARISON:  Percutaneous cholecystostomy tube placement 05/13/2013.  FINDINGS: Intraoperative spot images obtained during laparoscopic cholecystectomy demonstrate cannulation of the cystic duct stump and opacifications of the intra and extrahepatic biliary tree. There is no evidence of biliary ductal dilatation, stenosis, or filling defect. Contrast material flows freely through the ampulla and into the duodenum.  IMPRESSION: Negative intraoperative cholangiogram.  Electronically Signed   By: Jacqulynn Cadet M.D.   On: 07/25/2013 11:20    Anti-infectives: Anti-infectives   Start     Dose/Rate Route Frequency Ordered Stop   07/25/13 1500  ceFAZolin (ANCEF) IVPB 2 g/50 mL premix     2 g 100 mL/hr over 30 Minutes Intravenous 3 times per day 07/25/13 1224 07/26/13 0615   07/25/13 0600  ceFAZolin (ANCEF) IVPB 2 g/50 mL premix     2 g 100 mL/hr over 30 Minutes  Intravenous On call to O.R. 07/24/13 1437 07/25/13 0735       Assessment/Plan  1. POD 1, s/p lap chole with drain placement 2. Tachycardia 3. DM 4. Post op ileus  Plan: 1. Diabetes meds held right now due to hypoglycemia and post ileus with poor po intake  2. Cont clears, but if develops vomiting may need NGT  3. Try to mobilize and work on IS 4. Follow tachycardia  5. Ancef for 3 doses post op.  LOS: 1 day    Lorna Strother E 07/26/2013, 11:33 AM Pager: [phone]

## 2013-07-27 LAB — GLUCOSE, CAPILLARY
GLUCOSE-CAPILLARY: 184 mg/dL — AB (ref 70–99)
GLUCOSE-CAPILLARY: 180 mg/dL — AB (ref 70–99)
Glucose-Capillary: 150 mg/dL — ABNORMAL HIGH (ref 70–99)
Glucose-Capillary: 177 mg/dL — ABNORMAL HIGH (ref 70–99)
Glucose-Capillary: 199 mg/dL — ABNORMAL HIGH (ref 70–99)
Glucose-Capillary: 202 mg/dL — ABNORMAL HIGH (ref 70–99)

## 2013-07-27 LAB — BASIC METABOLIC PANEL
BUN: 20 mg/dL (ref 6–23)
CALCIUM: 9 mg/dL (ref 8.4–10.5)
CO2: 24 meq/L (ref 19–32)
Chloride: 99 mEq/L (ref 96–112)
Creatinine, Ser: 1.32 mg/dL (ref 0.50–1.35)
GFR calc Af Amer: 59 mL/min — ABNORMAL LOW (ref >90)
GFR calc non Af Amer: 51 mL/min — ABNORMAL LOW (ref >90)
GLUCOSE: 155 mg/dL — AB (ref 70–99)
Potassium: 4.9 mEq/L (ref 3.7–5.3)
Sodium: 134 mEq/L — ABNORMAL LOW (ref 137–147)

## 2013-07-27 LAB — CBC
HCT: 34.2 % — ABNORMAL LOW (ref 39.0–52.0)
Hemoglobin: 11.2 g/dL — ABNORMAL LOW (ref 13.0–17.0)
MCH: 28.4 pg (ref 26.0–34.0)
MCHC: 32.7 g/dL (ref 30.0–36.0)
MCV: 86.6 fL (ref 78.0–100.0)
Platelets: 277 10*3/uL (ref 150–400)
RBC: 3.95 MIL/uL — AB (ref 4.22–5.81)
RDW: 15.5 % (ref 11.5–15.5)
WBC: 11.8 10*3/uL — ABNORMAL HIGH (ref 4.0–10.5)

## 2013-07-27 MED ORDER — METOCLOPRAMIDE HCL 5 MG/ML IJ SOLN
5.0000 mg | Freq: Four times a day (QID) | INTRAMUSCULAR | Status: DC
  Administered 2013-07-27 – 2013-07-29 (×7): 5 mg via INTRAVENOUS
  Filled 2013-07-27: qty 1
  Filled 2013-07-27: qty 2
  Filled 2013-07-27 (×2): qty 1
  Filled 2013-07-27: qty 2
  Filled 2013-07-27 (×2): qty 1
  Filled 2013-07-27 (×2): qty 2
  Filled 2013-07-27 (×2): qty 1
  Filled 2013-07-27: qty 2
  Filled 2013-07-27: qty 1

## 2013-07-27 NOTE — Evaluation (Signed)
Physical Therapy Evaluation Patient Details Name: Jake Fuhrmann MRN: [id number] DOB: [birth year] Today's Date: 07/27/2013 Time: 2505-3976 PT Time Calculation (min): 22 min  PT Assessment / Plan / Recommendation History of Present Illness  Pt is a 78 y/o male admitted s/p laparoscopic cholecystectomy with intraoperative cholangiogram.  Clinical Impression  This patient presents with acute pain and decreased functional independence following the above mentioned procedure. At the time of PT eval, pt reports he is not at his baseline of function and feels he is very weak. Safety is a concern and feel he could benefit from continued rehab at d/c. This patient is appropriate for skilled PT interventions to address functional limitations, improve safety and independence with functional mobility, and return to PLOF.     PT Assessment  Patient needs continued PT services    Follow Up Recommendations  SNF    Does the patient have the potential to tolerate intense rehabilitation      Barriers to Discharge        Equipment Recommendations  Other (comment) (TBD by next venue of care)    Recommendations for Other Services     Frequency Min 2X/week    Precautions / Restrictions Precautions Precautions: Fall Restrictions Weight Bearing Restrictions: No   Pertinent Vitals/Pain Pt reports 6/10 pain at rest, and patient repositioned for comfort.       Mobility  Bed Mobility General bed mobility comments: NT - pt received sitting up in chair Transfers Overall transfer level: Needs assistance Equipment used: Rolling walker (2 wheeled) Transfers: Sit to/from Stand Sit to Stand: Mod assist General transfer comment: VC's for hand placement on seated surface for safety. Assist for power-up to full stand.  Ambulation/Gait Ambulation/Gait assistance: Min assist Ambulation Distance (Feet): 25 Feet Assistive device: Rolling walker (2 wheeled) Gait Pattern/deviations: Step-to  pattern;Decreased stride length;Shuffle Gait velocity: Decreased Gait velocity interpretation: Below normal speed for age/gender General Gait Details: VC's for increased step/stride length, as well as for increased floor clearance. Pt required occasional assist for walker placement.     Exercises General Exercises - Lower Extremity Ankle Circles/Pumps: 10 reps Quad Sets: 10 reps   PT Diagnosis: Difficulty walking;Acute pain  PT Problem List: Decreased strength;Decreased range of motion;Decreased activity tolerance;Decreased balance;Decreased mobility;Decreased knowledge of use of DME;Decreased safety awareness;Pain PT Treatment Interventions: DME instruction;Gait training;Stair training;Functional mobility training;Therapeutic activities;Therapeutic exercise;Neuromuscular re-education;Patient/family education     PT Goals(Current goals can be found in the care plan section) Acute Rehab PT Goals Patient Stated Goal: To return to PLOF PT Goal Formulation: With patient Time For Goal Achievement: 08/10/13 Potential to Achieve Goals: Fair  Visit Information  Last PT Received On: 07/27/13 Assistance Needed: +1 History of Present Illness: Pt is a 78 y/o male admitted s/p laparoscopic cholecystectomy with intraoperative cholangiogram.       Prior Functioning  Home Living Family/patient expects to be discharged to:: Private residence Living Arrangements: Spouse/significant other;Other relatives;Children Available Help at Discharge: Family;Available 24 hours/day Type of Home: House Home Access: Stairs to enter [employer] of Steps: 3 in back or 4 in the front Entrance Stairs-Rails: Left;Right;Can reach both (in front can reach both, only on L in back) Home Layout: One level Home Equipment: Walker - 2 wheels;Shower seat;Cane - single point;Wheelchair - manual Prior Function Level of Independence: Needs assistance Comments: occasionally needed assist with bathing and  dressing. Used RW outside of home and occasionally used cane inside home Communication Communication: No difficulties Dominant Hand: Right    Cognition  Cognition Arousal/Alertness: Lethargic Behavior  During Therapy: WFL for tasks assessed/performed Overall Cognitive Status: Within Functional Limits for tasks assessed    Extremity/Trunk Assessment Upper Extremity Assessment Upper Extremity Assessment: RUE deficits/detail RUE Deficits / Details: Pt states he had a stroke september of last year and hasn't been able to use R hand as much since. Pt does not appear to have difficulty managing RW with the RUE. Lower Extremity Assessment Lower Extremity Assessment: RLE deficits/detail RLE Deficits / Details: Pt with polio as a child, and has significant muscle atrophy and weakness in the quads, hamstrings, and gastroc/soleus complex Cervical / Trunk Assessment Cervical / Trunk Assessment: Kyphotic   Balance Balance Overall balance assessment: Needs assistance Sitting-balance support: Feet supported;Bilateral upper extremity supported Sitting balance-Leahy Scale: Good Standing balance support: Bilateral upper extremity supported Standing balance-Leahy Scale: Fair  End of Session PT - End of Session Equipment Utilized During Treatment: Gait belt Activity Tolerance: Patient limited by fatigue Patient left: in chair;with call bell/phone within reach Nurse Communication: Mobility status  GP Functional Assessment Tool Used: Clinical judgement Functional Limitation: Mobility: Walking and moving around Mobility: Walking and Moving Around Current Status ([id number]): At least 20 percent but less than 40 percent impaired, limited or restricted Mobility: Walking and Moving Around Goal Status [phone]): At least 20 percent but less than 40 percent impaired, limited or restricted   Jolyn Lent 07/27/2013, 12:48 PM  Jolyn Lent, [place], DPT [phone]

## 2013-07-27 NOTE — Progress Notes (Signed)
2 Days Post-Op  Subjective: Patient remains fairly distended No nausea or vomiting No flatus or BM Still has foley in place - on Flomax  Objective: Vital signs in last 24 hours: Temp:  [98.8 F (37.1 C)-101.8 F (38.8 C)] 100.4 F (38 C) (02/08 0459) Pulse Rate:  [112-122] 112 (02/08 0459) Resp:  [17-19] 18 (02/08 0459) BP: (129-162)/(65-91) 162/80 mmHg (02/08 0459) SpO2:  [96 %-100 %] 96 % (02/08 0459) Last BM Date: 07/24/13  Intake/Output from previous day: 02/07 0701 - 02/08 0700 In: 615 [P.O.:240; I.V.:375] Out: 983 [Urine:950; Drains:33] Intake/Output this shift: Total I/O In: 360 [P.O.:360] Out: -   General appearance: alert, cooperative and no distress GI: distended; hypoactive bowel sounds Incisions c/d/i; serosanguinous drainage from drainage tube  Lab Results:   Recent Labs  07/26/13 0600 07/27/13 0411  WBC 14.4* 11.8*  HGB 10.4* 11.2*  HCT 32.5* 34.2*  PLT 303 277   BMET  Recent Labs  07/26/13 0600 07/27/13 0411  NA 142 134*  K 4.4 4.9  CL 105 99  CO2 24 24  GLUCOSE 43* 155*  BUN 22 20  CREATININE 1.23 1.32  CALCIUM 8.8 9.0   PT/INR No results found for this basename: LABPROT, INR,  in the last 72 hours ABG No results found for this basename: PHART, PCO2, PO2, HCO3,  in the last 72 hours  Studies/Results: No results found.  Anti-infectives: Anti-infectives   Start     Dose/Rate Route Frequency Ordered Stop   07/25/13 1500  ceFAZolin (ANCEF) IVPB 2 g/50 mL premix     2 g 100 mL/hr over 30 Minutes Intravenous 3 times per day 07/25/13 1224 07/26/13 0615   07/25/13 0600  ceFAZolin (ANCEF) IVPB 2 g/50 mL premix     2 g 100 mL/hr over 30 Minutes Intravenous On call to O.R. 07/24/13 1437 07/25/13 0735      Assessment/Plan: s/p Procedure(s): LAPAROSCOPIC CHOLECYSTECTOMY WITH INTRAOPERATIVE CHOLANGIOGRAM (N/A) D/C foley Continue clears Add Reglan for post-operative ileus Ambulate   LOS: 2 days    Kerianna Rawlinson K. 07/27/2013

## 2013-07-28 ENCOUNTER — Encounter (HOSPITAL_COMMUNITY): Payer: Self-pay | Admitting: General Surgery

## 2013-07-28 ENCOUNTER — Ambulatory Visit (HOSPITAL_COMMUNITY): Payer: Medicare Other

## 2013-07-28 DIAGNOSIS — R5383 Other fatigue: Secondary | ICD-10-CM

## 2013-07-28 DIAGNOSIS — D649 Anemia, unspecified: Secondary | ICD-10-CM

## 2013-07-28 DIAGNOSIS — R5381 Other malaise: Secondary | ICD-10-CM

## 2013-07-28 DIAGNOSIS — I10 Essential (primary) hypertension: Secondary | ICD-10-CM

## 2013-07-28 DIAGNOSIS — K810 Acute cholecystitis: Secondary | ICD-10-CM

## 2013-07-28 DIAGNOSIS — E119 Type 2 diabetes mellitus without complications: Secondary | ICD-10-CM

## 2013-07-28 LAB — CBC
HEMATOCRIT: 30.7 % — AB (ref 39.0–52.0)
HEMOGLOBIN: 10.1 g/dL — AB (ref 13.0–17.0)
MCH: 27.9 pg (ref 26.0–34.0)
MCHC: 32.9 g/dL (ref 30.0–36.0)
MCV: 84.8 fL (ref 78.0–100.0)
Platelets: 233 10*3/uL (ref 150–400)
RBC: 3.62 MIL/uL — AB (ref 4.22–5.81)
RDW: 15.2 % (ref 11.5–15.5)
WBC: 8.7 10*3/uL (ref 4.0–10.5)

## 2013-07-28 LAB — GLUCOSE, CAPILLARY
GLUCOSE-CAPILLARY: 175 mg/dL — AB (ref 70–99)
GLUCOSE-CAPILLARY: 298 mg/dL — AB (ref 70–99)
Glucose-Capillary: 179 mg/dL — ABNORMAL HIGH (ref 70–99)
Glucose-Capillary: 231 mg/dL — ABNORMAL HIGH (ref 70–99)
Glucose-Capillary: 184 mg/dL — ABNORMAL HIGH (ref 70–99)
Glucose-Capillary: 167 mg/dL — ABNORMAL HIGH (ref 70–99)

## 2013-07-28 LAB — COMPREHENSIVE METABOLIC PANEL
ALT: 23 U/L (ref 0–53)
AST: 27 U/L (ref 0–37)
Albumin: 2.7 g/dL — ABNORMAL LOW (ref 3.5–5.2)
Alkaline Phosphatase: 99 U/L (ref 39–117)
BILIRUBIN TOTAL: 1.1 mg/dL (ref 0.3–1.2)
BUN: 19 mg/dL (ref 6–23)
CHLORIDE: 99 meq/L (ref 96–112)
CO2: 22 meq/L (ref 19–32)
CREATININE: 1.28 mg/dL (ref 0.50–1.35)
Calcium: 8.7 mg/dL (ref 8.4–10.5)
GFR, EST AFRICAN AMERICAN: 61 mL/min — AB (ref >90)
GFR, EST NON AFRICAN AMERICAN: 53 mL/min — AB (ref >90)
GLUCOSE: 194 mg/dL — AB (ref 70–99)
Potassium: 5.1 mEq/L (ref 3.7–5.3)
Sodium: 134 mEq/L — ABNORMAL LOW (ref 137–147)
Total Protein: 7 g/dL (ref 6.0–8.3)

## 2013-07-28 LAB — URINE MICROSCOPIC-ADD ON

## 2013-07-28 LAB — URINALYSIS, ROUTINE W REFLEX MICROSCOPIC
BILIRUBIN URINE: NEGATIVE
Glucose, UA: >1000 mg/dL — AB
Ketones, ur: NEGATIVE mg/dL
Nitrite: NEGATIVE
PH: 6.5 (ref 5.0–8.0)
Protein, ur: NEGATIVE mg/dL
Specific Gravity, Urine: 1.014 (ref 1.005–1.030)
Urobilinogen, UA: 0.2 mg/dL (ref 0.0–1.0)

## 2013-07-28 LAB — LIPASE, BLOOD: Lipase: 11 U/L (ref 11–59)

## 2013-07-28 MED ORDER — SODIUM CHLORIDE 0.9 % IV SOLN
12.5000 mg | Freq: Four times a day (QID) | INTRAVENOUS | Status: DC | PRN
  Administered 2013-07-28 – 2013-07-29 (×2): 12.5 mg via INTRAVENOUS
  Filled 2013-07-28 (×2): qty 0.5

## 2013-07-28 MED ORDER — POLYETHYLENE GLYCOL 3350 17 G PO PACK
17.0000 g | PACK | Freq: Two times a day (BID) | ORAL | Status: AC
  Administered 2013-07-28 – 2013-07-29 (×2): 17 g via ORAL
  Filled 2013-07-28 (×3): qty 1

## 2013-07-28 MED ORDER — CEFTRIAXONE SODIUM 1 G IJ SOLR
1.0000 g | INTRAMUSCULAR | Status: DC
  Administered 2013-07-28 – 2013-07-30 (×3): 1 g via INTRAVENOUS
  Filled 2013-07-28 (×4): qty 10

## 2013-07-28 MED ORDER — INSULIN DETEMIR 100 UNIT/ML ~~LOC~~ SOLN
5.0000 [IU] | Freq: Two times a day (BID) | SUBCUTANEOUS | Status: DC
  Administered 2013-07-28 – 2013-07-29 (×3): 5 [IU] via SUBCUTANEOUS
  Filled 2013-07-28 (×4): qty 0.05

## 2013-07-28 MED ORDER — AMLODIPINE BESYLATE 10 MG PO TABS
10.0000 mg | ORAL_TABLET | Freq: Every morning | ORAL | Status: DC
  Administered 2013-07-29 – 2013-07-30 (×2): 10 mg via ORAL
  Filled 2013-07-28 (×2): qty 1

## 2013-07-28 MED ORDER — MILK AND MOLASSES ENEMA
1.0000 | Freq: Once | RECTAL | Status: AC
  Administered 2013-07-28: 250 mL via RECTAL
  Filled 2013-07-28: qty 250

## 2013-07-28 MED ORDER — KETOROLAC TROMETHAMINE 15 MG/ML IJ SOLN
15.0000 mg | Freq: Four times a day (QID) | INTRAMUSCULAR | Status: DC | PRN
  Administered 2013-07-28 – 2013-07-30 (×4): 15 mg via INTRAVENOUS
  Filled 2013-07-28 (×4): qty 1

## 2013-07-28 MED ORDER — DEXTROSE-NACL 5-0.45 % IV SOLN
INTRAVENOUS | Status: DC
  Administered 2013-07-28 – 2013-07-29 (×2): via INTRAVENOUS
  Administered 2013-07-29: 50 mL/h via INTRAVENOUS

## 2013-07-28 NOTE — Progress Notes (Signed)
Inpatient Diabetes Program Recommendations  AACE/ADA: New Consensus Statement on Inpatient Glycemic Control (2013)  Target Ranges:  Prepandial:   less than 140 mg/dL      Peak postprandial:   less than 180 mg/dL (1-2 hours)      Critically ill patients:  140 - 180 mg/dL     Results for WINFERD, WEASE (MRN [id number]) as of 07/28/2013 11:26  Ref. Range 07/27/2013 23:54 07/28/2013 03:52 07/28/2013 07:43  Glucose-Capillary Latest Range: 70-99 mg/dL 202 (H) 179 (H) 175 (H)    Diabetes history: Type 2 DM Outpatient Diabetes medications: Levemir 50 units bid Current orders for Inpatient glycemic control: Novolog Sensitive SSI Q4 hours   **Note patient takes fairly large dose of Levemir at home.  Just started [facility] diet today.  POD#3.   **MD- Please consider increasing Novolog SSI to Moderate scale tid ac + HS (currently ordered as Sensitive scale Q4 hours)          -Patient may need a portion of his Levemir dose at some point- Will follow and assess for increasing insulin needs   Wyn Quaker RN, MSN, CDE Diabetes Coordinator Inpatient Diabetes Program Team Pager: [phone] (8a-10p)

## 2013-07-28 NOTE — Progress Notes (Signed)
[facility] Medical Consultation  Mike Mack [id number] DOB: 12-06-1935 DOA: 07/25/2013 PCP: [name], MD   Requesting physician: [name] Date of consultation: 07/28/2012 Reason for consultation: management of multiple medical problems  Impression/Recommendations Principal Problem:   Cholecystitis, acute Active Problems:   Diabetes mellitus without complication   Hypertension   BPH (benign prostatic hyperplasia)   Stroke   Anemia   Generalized weakness   Arthritis   Cholecystitis with cholelithiasis   1. Post op fever. Febrile to 100.4 on 2/8, afebrile this morning. Past history of enterococcus UTI during last hospitalization s/p treatment. Recheck urinalysis given increased frequency. CXR without evidence of pneumonia.  2. Tachycardia - regular rate, obtain an EKG for documentation (sinus tach). Likely due to abdominal pain. Continue fluids, but will decrease rate as he is eating more. Stop K in fluids, 5.1 on labs this morning. 3. Anemia - stable, no current evidence of bleeding.  4. HTN - currently on Norvasc and Avapro. Bit more hypertensive today, likely due to pain also. He has fentanyl as prn, encouraged to ask for. Can narrow to oxycodone if tolerating po consistently. Meanwhile increasing his Norvasc to 10.  5. DM - hypoglycemic on 50 Levemir BID 2 days ago, on no long acting since. Fasting 175 this morning, CBGs remain relatively stable throughout the day but elevated. He is eating more today, would favor starting low dose basal coverage of 5 BID and monitor. A1C 7.1 showing good control.  6. Hx CVA - resume aspirin when safe from surgical standpoint.  7. Generalized weakness - PT recommended SNF. Patient open to the idea, consult SW. 8. Arthritis - patient is on prednisone for this - he thinks. He has been on prednisone for about 1 year. Would recommend that this is addressed as an outpatient.  9. BPH - continue home medications 10. Cholelithiasis  s/p laparoscopic cholecystectomy 2/6 - per surgery.   I will followup again tomorrow. Please contact me if I can be of assistance in the meanwhile. Thank you for this consultation.  Chief Complaint: abdominal pain  HPI:  This is a pleasant 78 yo M with past medical history as below, recent hospitalization about 2 months ago with RUQ abdominal pain, nausea and vomiting, found to have acute cholecystitis s/p percutaneous drain placement, now stabilized as an outpatient and being readmitted for elective cholecystectomy; he underwent cholecystectomy on 2/6, has developed postop ileus and was febrile on 2/8. [facility] consulted for management of his co-existing multiple medical problems.  He complains today of abdominal "discomfort", abdominal pain around the surgical site. Endorses nausea but has been able to eat some liquids this morning. Denies chest pain or breathing difficulties. Denies palpitations. Denies fevers/chills, denies burning with urination but endorses increased urination.   Review of Systems:  As per HPI otherwise negative.   Past Medical History  Diagnosis Date  . Hypertension   . Small bowel obstruction   . BPH (benign prostatic hyperplasia)   . Neuropathy   . Polio   . Polio Childhood  . Constipation 05/13/2013  . Anemia 05/13/2013  . Type II diabetes mellitus   . Exertional shortness of breath     "sometimes" (07/25/2013)  . History of stomach ulcers   . Stroke 2014    residual:  "left hand kind of numb" (07/25/2013)  . Arthritis     "right leg" (07/25/2013)   Past Surgical History  Procedure Laterality Date  . Bowel resection    . Cataract extraction w/ intraocular lens  implant, bilateral Bilateral 2000's  . Colonoscopy      Hx: of  . Laparoscopic cholecystectomy  07/25/2013    w/LOA (07/25/2013)  . Excisional hemorrhoidectomy  2000's  . Colon surgery    . Inguinal hernia repair Right 1970's   Social History:  reports that he has never smoked. He has  never used smokeless tobacco. He reports that he does not drink alcohol or use illicit drugs.  No Known Allergies Family History  Problem Relation Age of Onset  . Diabetes Sister   . Diabetes Sister   . Diabetes Brother   . Hypertension Sister   . Hypertension Sister   . Hypertension Brother   . Heart failure Mother   . Heart attack Sister     Prior to Admission medications   Medication Sig Start Date End Date Taking? Authorizing Provider  amLODipine (NORVASC) 5 MG tablet Take 5 mg by mouth every morning.    Yes Historical Provider, MD  atorvastatin (LIPITOR) 20 MG tablet Take 1 tablet by mouth every evening. 05/02/13  Yes Historical Provider, MD  docusate sodium 100 MG CAPS Take 100 mg by mouth 2 (two) times daily. 05/19/13  Yes Robbie Lis, MD  gabapentin (NEURONTIN) 800 MG tablet Take 1 tablet by mouth 3 (three) times daily as needed (HEADACHES).  05/09/13  Yes Historical Provider, MD  Insulin Detemir (LEVEMIR FLEXPEN) 100 UNIT/ML Pen Inject 50 Units into the skin 2 (two) times daily.   Yes Historical Provider, MD  irbesartan (AVAPRO) 300 MG tablet Take 300 mg by mouth at bedtime.   Yes Historical Provider, MD  polyethylene glycol (MIRALAX / GLYCOLAX) packet Take 17 g by mouth 3 (three) times daily. 05/19/13  Yes Robbie Lis, MD  predniSONE (DELTASONE) 5 MG tablet Take 5 mg by mouth daily with breakfast.   Yes Historical Provider, MD  saccharomyces boulardii (FLORASTOR) 250 MG capsule Take 250 mg by mouth 2 (two) times daily.   Yes Historical Provider, MD  solifenacin (VESICARE) 10 MG tablet Take 10 mg by mouth every evening.   Yes Historical Provider, MD  Tamsulosin HCl (FLOMAX) 0.4 MG CAPS Take 0.4 mg by mouth 2 (two) times daily.    Yes Historical Provider, MD  Travoprost, BAK Free, (TRAVATAN) 0.004 % SOLN ophthalmic solution Place 1 drop into both eyes at bedtime.   Yes Historical Provider, MD   Physical Exam: Blood pressure 170/69, pulse 118, temperature 99.6 F (37.6 C),  temperature source Oral, resp. rate 16, height 5\' 5"  (1.651 m), weight 68.7 kg (151 lb 7.3 oz), SpO2 96.00%. Filed Vitals:   07/28/13 0505  BP: 170/69  Pulse: 118  Temp: 99.6 F (37.6 C)  Resp: 16     General:  NAD  Eyes: no scleral icterus  ENT: moist mm  Neck: no JVD  Cardiovascular: RRR, no MRG  Respiratory: CTA biL  Abdomen: mild distention, tender to palpation RUQ  Skin: no rashes  Musculoskeletal: no peripheral edema  Psychiatric: normal mood and affect  Neurologic: grossly non focal.   Labs on Admission:  Basic Metabolic Panel:  Recent Labs Lab 07/24/13 1556 07/26/13 0600 07/27/13 0411 07/28/13 0915  NA 139 142 134* 134*  K 5.1 4.4 4.9 5.1  CL 102 105 99 99  CO2 24 24 24 22   GLUCOSE 202* 43* 155* 194*  BUN 25* 22 20 19   CREATININE 1.31 1.23 1.32 1.28  CALCIUM 9.2 8.8 9.0 8.7   Liver Function Tests:  Recent Labs Lab 07/28/13 0915  AST 27  ALT 23  ALKPHOS 99  BILITOT 1.1  PROT 7.0  ALBUMIN 2.7*    Recent Labs Lab 07/28/13 0915  LIPASE 11   CBC:  Recent Labs Lab 07/24/13 1556 07/26/13 0600 07/27/13 0411 07/28/13 0915  WBC 7.8 14.4* 11.8* 8.7  HGB 11.3* 10.4* 11.2* 10.1*  HCT 34.8* 32.5* 34.2* 30.7*  MCV 86.6 86.4 86.6 84.8  PLT 340 303 277 233   CBG:  Recent Labs Lab 07/27/13 1546 07/27/13 2004 07/27/13 2354 07/28/13 0352 07/28/13 0743  GLUCAP 184* 180* 202* 179* 175*    Radiological Exams on Admission: Dg Chest 2 View  07/28/2013   CLINICAL DATA:  Postoperative fever.  EXAM: CHEST  2 VIEW  COMPARISON:  07/24/2013  FINDINGS: The cardiac silhouette, mediastinal and hilar contours are within normal limits and stable. There are small bilateral pleural effusions and bibasilar atelectasis. No focal infiltrates or pneumothorax.  IMPRESSION: Small pleural effusions and bibasilar atelectasis.   Electronically Signed   By: Kalman Jewels M.D.   On: 07/28/2013 08:22   Dg Abd 2 Views  07/28/2013   CLINICAL DATA:   Postoperative fever.  Status post cholecystectomy.  EXAM: ABDOMEN - 2 VIEW  COMPARISON:  None.  FINDINGS: Expected postoperative changes with a surgical drain in place. Probable postoperative ileus with air in the small bowel and colon. Possible areas of free air likely related to recent surgery.  IMPRESSION: Expected postoperative changes with postoperative free air, cholecystectomy clips and a drainage catheter.  Postoperative ileus.   Electronically Signed   By: Kalman Jewels M.D.   On: 07/28/2013 08:20    EKG: Independently reviewed. Sinus tachy  Time spent: [place], [place] [facility] Pager [phone]  If 7PM-7AM, please contact night-coverage [email or web address] Password [email or web address] 07/28/2013, 11:19 AM

## 2013-07-28 NOTE — Clinical Social Work Psychosocial (Signed)
Clinical Social Work Department BRIEF PSYCHOSOCIAL ASSESSMENT 07/28/2013  Patient:  Mike Mack, Mike Mack     Account Number:  [id number]     Admit date:  07/25/2013  Clinical Social Worker:  Donna Christen  Date/Time:  07/28/2013 02:32 PM  Referred by:  Physician  Date Referred:  07/28/2013 Referred for  SNF Placement   Other Referral:   none.   Interview type:  Patient Other interview type:   none.    PSYCHOSOCIAL DATA Living Status:  WIFE Admitted from facility:   Level of care:   Primary support name:  Gianluca Chhim Primary support relationship to patient:  SPOUSE Degree of support available:   Strong support system. Pt stated that he would like her input on discharge planning.    CURRENT CONCERNS Current Concerns  Post-Acute Placement   Other Concerns:   none.    SOCIAL WORK ASSESSMENT / PLAN CSW met with pt at bedside. Pt stated that he has previously been to a SNF for short-term rehabilitation, but was unable to recall facilities name. Pt did not express any concerns regarding discharge planning. CSW to continue to follow and assist with discharge planning needs.   Assessment/plan status:  Psychosocial Support/Ongoing Assessment of Needs Other assessment/ plan:   none.   Information/referral to community resources:   [facility] bed offers.    PATIENTS/FAMILYS RESPONSE TO PLAN OF CARE: Pt was agreeable to [facility] search, but stated that he will need to speak with his wife this evening to determine if SNF placement is "what we want."     Pati Gallo, [place] Worker [phone]

## 2013-07-28 NOTE — Clinical Social Work Placement (Addendum)
Clinical Social Work Department CLINICAL SOCIAL WORK PLACEMENT NOTE 07/28/2013  Patient:  KEGAN, MCKEITHAN  Account Number:  [id number] Admit date:  07/25/2013  Clinical Social Worker:  Raquel Sarna [place], LCSWA  Date/time:  07/28/2013 02:39 PM  Clinical Social Work is seeking post-discharge placement for this patient at the following level of care:   [place]   (*CSW will update this form in Epic as items are completed)   07/28/2013  Patient/family provided with [place] Department of Clinical Social Works list of facilities offering this level of care within the geographic area requested by the patient (or if unable, by the patients family).  07/28/2013  Patient/family informed of their freedom to choose among providers that offer the needed level of care, that participate in Medicare, Medicaid or managed care program needed by the patient, have an available bed and are willing to accept the patient.  07/28/2013  Patient/family informed of [facility] ownership interest in [facility], as well as of the fact that they are under no obligation to receive care at this facility.  PASARR submitted to EDS on  PASARR number received from [place] on   FL2 transmitted to all facilities in geographic area requested by pt/family on  07/28/2013 FL2 transmitted to all facilities within larger geographic area on   Patient informed that his/her managed care company has contracts with or will negotiate with  certain facilities, including the following:     Patient/family informed of bed offers received:  07/29/2013 Patient chooses bed at [facility] Physician recommends and patient chooses bed at    Patient to be transferred to  on  07/30/2013 Patient to be transferred to facility by   The following physician request were entered in Epic:   Additional Comments: Pt has existing PASARR.  Pati Gallo, [place] Social Worker [phone]

## 2013-07-28 NOTE — Progress Notes (Addendum)
3 Days Post-Op  Subjective: Stable, alert, cooperative. Complains of hiccoughs and weakness. Denies shortness of breath.  Fever seems to have resolved. SpO2 96% on room air. Vital capacity only 750 cc on my exam this morning. Denies nausea or vomiting. Passing flatus but no stool. Tolerating liquids. Still has abdominal distention, which has happened in the past, usually during acute illnesses. History chronic constipation as well.  Currently under evaluation by PT and  they have recommended SNF/Rehab due to severe deconditioning.. I discussed this with the patient at this time he is not in favor of discharge to SNF.  CBG's vary between 150 and 202.  On  Q4 hour sliding-scale insulin. Long-acting insulin discontinued over weekend due  to inadequate oral intake.   Objective: Vital signs in last 24 hours: Temp:  [99 F (37.2 C)-99.8 F (37.7 C)] 99.6 F (37.6 C) (02/09 0505) Pulse Rate:  [114-137] 118 (02/09 0505) Resp:  [16-20] 16 (02/09 0505) BP: (152-170)/(69-76) 170/69 mmHg (02/09 0505) SpO2:  [96 %-98 %] 96 % (02/09 0505) Last BM Date: 07/24/13  Intake/Output from previous day: 02/08 0701 - 02/09 0700 In: 2865 [P.O.:840; I.V.:2025] Out: 700 [Urine:700] Intake/Output this shift: Total I/O In: -  Out: 700 [Urine:700]    EXAM: General appearance: alert. Cooperative. Mental status normal. Hiccuping. Does not appear toxic or acutely ill. Resp: somewhat decreased breath sounds at bases with faint crackles at bases. No wheezing or rhonchi. GI: abdomen is distended. Bowel sounds are present. Minimal, appropriate postop tenderness. Wounds look good. Drainage is serosanguineous. Low-volume.  Lab Results:  Results for orders placed during the hospital encounter of 07/25/13 (from the past 24 hour(s))  GLUCOSE, CAPILLARY     Status: Abnormal   Collection Time    07/27/13  7:49 AM      Result Value Range   Glucose-Capillary 177 (*) 70 - 99 mg/dL  GLUCOSE, CAPILLARY     Status:  Abnormal   Collection Time    07/27/13 11:57 AM      Result Value Range   Glucose-Capillary 199 (*) 70 - 99 mg/dL  GLUCOSE, CAPILLARY     Status: Abnormal   Collection Time    07/27/13  3:46 PM      Result Value Range   Glucose-Capillary 184 (*) 70 - 99 mg/dL  GLUCOSE, CAPILLARY     Status: Abnormal   Collection Time    07/27/13  8:04 PM      Result Value Range   Glucose-Capillary 180 (*) 70 - 99 mg/dL   Comment 1 Documented in Chart     Comment 2 Notify RN    GLUCOSE, CAPILLARY     Status: Abnormal   Collection Time    07/27/13 11:54 PM      Result Value Range   Glucose-Capillary 202 (*) 70 - 99 mg/dL   Comment 1 Documented in Chart     Comment 2 Notify RN    GLUCOSE, CAPILLARY     Status: Abnormal   Collection Time    07/28/13  3:52 AM      Result Value Range   Glucose-Capillary 179 (*) 70 - 99 mg/dL   Comment 1 Notify RN       Studies/Results: @RISRSLT24 @  . amLODipine  5 mg Oral q morning - 10a  . atorvastatin  20 mg Oral QPM  . darifenacin  7.5 mg Oral Daily  . docusate sodium  100 mg Oral BID  . enoxaparin (LOVENOX) injection  40 mg Subcutaneous Q24H  .  insulin aspart  0-9 Units Subcutaneous Q4H  . irbesartan  300 mg Oral QHS  . latanoprost  1 drop Both Eyes QHS  . metoCLOPramide (REGLAN) injection  5 mg Intravenous Q6H  . milk and molasses  1 enema Rectal Once  . polyethylene glycol  17 g Oral TID  . polyethylene glycol  17 g Oral BID  . predniSONE  5 mg Oral Q breakfast  . saccharomyces boulardii  250 mg Oral BID  . tamsulosin  0.4 mg Oral BID     Assessment/Plan: s/p Procedure(s): LAPAROSCOPIC CHOLECYSTECTOMY WITH INTRAOPERATIVE CHOLANGIOGRAM  POD #3. Laparoscopic lysis of adhesions and laparoscopic cholecystectomy with cholangiogram. No apparent direct surgical complication  Acute ileus superimposed on chronic constipation. Check abdominal x-rays to rule out acute gastric distention as cause of hiccoughs Check lab work today, including LFTs and  lipase Discontinue narcotics and substitute Toradol Continue Reglan try enema and MiraLAX. Thorazine for hiccoughs  Acute deconditioning superimposed on chronic deconditioning, walker dependent. At this time patient seems disinclined to pursue SNF. Both PT and I are concerned about fall risk.  History of cerebrovascular accident with right-sided weakness and history of polio.  IDDM - continue SSI.  Will need to be adjusted assuming diet is advanced.  HTN -  Reasonable control on norvasc  History recent hospitalization(Nov., 2014)  for acute cholecystitis with percutaneous drainage and enterococcus UTI.  Remote history laparotomy and lysis of adhesions for SBO, 2002.  BPH on flomax  Will request inpatient consult from [facility], internal medicine, to assist with management of numerous medical problems and disposition.    @PROBHOSP @  LOS: 3 days    Jeb Schloemer M. Dalbert Batman, M.D., [facility] General and Minimally invasive Surgery Breast and Colorectal Surgery Office:   [phone] Pager:   [phone]  07/28/2013  . .prob

## 2013-07-29 DIAGNOSIS — Z9049 Acquired absence of other specified parts of digestive tract: Secondary | ICD-10-CM

## 2013-07-29 DIAGNOSIS — M129 Arthropathy, unspecified: Secondary | ICD-10-CM

## 2013-07-29 DIAGNOSIS — I6350 Cerebral infarction due to unspecified occlusion or stenosis of unspecified cerebral artery: Secondary | ICD-10-CM

## 2013-07-29 LAB — GLUCOSE, CAPILLARY
GLUCOSE-CAPILLARY: 149 mg/dL — AB (ref 70–99)
GLUCOSE-CAPILLARY: 168 mg/dL — AB (ref 70–99)
Glucose-Capillary: 145 mg/dL — ABNORMAL HIGH (ref 70–99)
Glucose-Capillary: 223 mg/dL — ABNORMAL HIGH (ref 70–99)
Glucose-Capillary: 261 mg/dL — ABNORMAL HIGH (ref 70–99)

## 2013-07-29 LAB — URINE CULTURE
Colony Count: NO GROWTH
Culture: NO GROWTH

## 2013-07-29 MED ORDER — METOCLOPRAMIDE HCL 5 MG PO TABS
5.0000 mg | ORAL_TABLET | Freq: Three times a day (TID) | ORAL | Status: DC
Start: 2013-07-29 — End: 2013-07-30
  Administered 2013-07-29 – 2013-07-30 (×7): 5 mg via ORAL
  Filled 2013-07-29 (×9): qty 1

## 2013-07-29 MED ORDER — INSULIN DETEMIR 100 UNIT/ML ~~LOC~~ SOLN
10.0000 [IU] | Freq: Two times a day (BID) | SUBCUTANEOUS | Status: DC
  Administered 2013-07-29 – 2013-07-30 (×2): 10 [IU] via SUBCUTANEOUS
  Filled 2013-07-29 (×3): qty 0.1

## 2013-07-29 MED ORDER — METOPROLOL TARTRATE 12.5 MG HALF TABLET
12.5000 mg | ORAL_TABLET | Freq: Two times a day (BID) | ORAL | Status: DC
  Administered 2013-07-29 – 2013-07-30 (×3): 12.5 mg via ORAL
  Filled 2013-07-29 (×4): qty 1

## 2013-07-29 NOTE — Consult Note (Signed)
[facility] PROGRESS NOTE  Rishon Thilges VCB:[id number] DOB: [birth year] DOA: 07/25/2013 PCP: [name], MD  Assessment/Plan: Principal Problem:  Cholecystitis, acute  Active Problems:  Diabetes mellitus without complication  Hypertension  BPH (benign prostatic hyperplasia)  Stroke  Anemia  Generalized weakness  Arthritis  Cholecystitis with cholelithiasis   1. Post op fever with probable UTI. Febrile to 100.4 on 2/8, Past history of enterococcus UTI during last hospitalization;  CXR without evidence of pneumonia.  -afebrile >48 hours, cot atx, awaiting urine culture  2. Tachycardia; ECG NSR, sinus tachy;  Likely due to abdominal pain. Anemia - stable, no current evidence of bleeding.  -start low dose BB to control HR, BP 3. HTN - currently on Norvasc and Avapro. Added BB 4. DM - hypoglycemic on 50 Levemir BID 2 days ago, on no long acting since. Fasting elevated. He is eating more, would favor start edlow dose basal coverage 10 BID and monitor. A1C 7.1 showing good control.  5. Hx CVA - resume aspirin when safe from surgical standpoint.  6. Generalized weakness - PT recommended SNF. Patient open to the idea, consult SW. 7. Arthritis - patient is on prednisone for this - he thinks. He has been on prednisone for about 1 year. Would recommend that this is addressed as an outpatient.  8. BPH - continue home medications 9. Cholelithiasis s/p laparoscopic cholecystectomy 2/6 - per surgery.   I will followup again tomorrow. Please contact me if I can be of assistance in the meanwhile. Thank you for this consultation.   Code Status: full Family Communication: d/w aptient (indicate person spoken with, relationship, and if by phone, the number) Disposition Plan: home per primary    Consultants:  surgery   Procedures:  Surgery   Antibiotics:  Ceftriaxone 2/9<<<<   (indicate start date, and stop date if known)  HPI/Subjective: alert  Objective: Filed  Vitals:   07/29/13 1004  BP:   Pulse: 125  Temp:   Resp:     Intake/Output Summary (Last 24 hours) at 07/29/13 1010 Last data filed at 07/29/13 0900  Gross per 24 hour  Intake 629.17 ml  Output   2535 ml  Net -1905.83 ml   Filed Weights   07/25/13 1700 07/28/13 1100  Weight: 68.7 kg (151 lb 7.3 oz) 65.5 kg (144 lb 6.4 oz)    Exam:   General:  alert  Cardiovascular: s1,s2 rrr  Respiratory: few crackles in LL  Abdomen: soft, nt post op  Musculoskeletal: no edema   Data Reviewed: Basic Metabolic Panel:  Recent Labs Lab 07/24/13 1556 07/26/13 0600 07/27/13 0411 07/28/13 0915  NA 139 142 134* 134*  K 5.1 4.4 4.9 5.1  CL 102 105 99 99  CO2 24 24 24 22   GLUCOSE 202* 43* 155* 194*  BUN 25* 22 20 19   CREATININE 1.31 1.23 1.32 1.28  CALCIUM 9.2 8.8 9.0 8.7   Liver Function Tests:  Recent Labs Lab 07/28/13 0915  AST 27  ALT 23  ALKPHOS 99  BILITOT 1.1  PROT 7.0  ALBUMIN 2.7*    Recent Labs Lab 07/28/13 0915  LIPASE 11   No results found for this basename: AMMONIA,  in the last 168 hours CBC:  Recent Labs Lab 07/24/13 1556 07/26/13 0600 07/27/13 0411 07/28/13 0915  WBC 7.8 14.4* 11.8* 8.7  HGB 11.3* 10.4* 11.2* 10.1*  HCT 34.8* 32.5* 34.2* 30.7*  MCV 86.6 86.4 86.6 84.8  PLT 340 303 277 233   Cardiac Enzymes: No results found  for this basename: CKTOTAL, CKMB, CKMBINDEX, TROPONINI,  in the last 168 hours BNP (last 3 results) No results found for this basename: PROBNP,  in the last 8760 hours CBG:  Recent Labs Lab 07/28/13 1618 07/28/13 1946 07/28/13 2324 07/29/13 0342 07/29/13 0743  GLUCAP 298* 184* 167* 145* 149*    Recent Results (from the past 240 hour(s))  SURGICAL PCR SCREEN     Status: None   Collection Time    07/24/13  3:58 PM      Result Value Range Status   MRSA, PCR NEGATIVE  NEGATIVE Final   Staphylococcus aureus NEGATIVE  NEGATIVE Final   Comment:            The Xpert SA Assay (FDA     approved for NASAL  specimens     in patients over 26 years of age),     is one component of     a comprehensive surveillance     program.  Test performance has     been validated by [employer] for patients greater     than or equal to 70 year old.     It is not intended     to diagnose infection nor to     guide or monitor treatment.     Studies: Dg Chest 2 View  07/28/2013   CLINICAL DATA:  Postoperative fever.  EXAM: CHEST  2 VIEW  COMPARISON:  07/24/2013  FINDINGS: The cardiac silhouette, mediastinal and hilar contours are within normal limits and stable. There are small bilateral pleural effusions and bibasilar atelectasis. No focal infiltrates or pneumothorax.  IMPRESSION: Small pleural effusions and bibasilar atelectasis.   Electronically Signed   By: Kalman Jewels M.D.   On: 07/28/2013 08:22   Dg Abd 2 Views  07/28/2013   CLINICAL DATA:  Postoperative fever.  Status post cholecystectomy.  EXAM: ABDOMEN - 2 VIEW  COMPARISON:  None.  FINDINGS: Expected postoperative changes with a surgical drain in place. Probable postoperative ileus with air in the small bowel and colon. Possible areas of free air likely related to recent surgery.  IMPRESSION: Expected postoperative changes with postoperative free air, cholecystectomy clips and a drainage catheter.  Postoperative ileus.   Electronically Signed   By: Kalman Jewels M.D.   On: 07/28/2013 08:20    Scheduled Meds: . amLODipine  10 mg Oral q morning - 10a  . atorvastatin  20 mg Oral QPM  . cefTRIAXone (ROCEPHIN)  IV  1 g Intravenous Q24H  . darifenacin  7.5 mg Oral Daily  . docusate sodium  100 mg Oral BID  . enoxaparin (LOVENOX) injection  40 mg Subcutaneous Q24H  . insulin aspart  0-9 Units Subcutaneous Q4H  . insulin detemir  5 Units Subcutaneous BID  . irbesartan  300 mg Oral QHS  . latanoprost  1 drop Both Eyes QHS  . metoCLOPramide  5 mg Oral TID AC & HS  . polyethylene glycol  17 g Oral BID  . predniSONE  5 mg Oral Q breakfast  .  saccharomyces boulardii  250 mg Oral BID  . tamsulosin  0.4 mg Oral BID   Continuous Infusions: . dextrose 5 % and 0.45% NaCl 50 mL/hr at 07/29/13 0435    Principal Problem:   Cholecystitis, acute Active Problems:   Diabetes mellitus without complication   Hypertension   BPH (benign prostatic hyperplasia)   Stroke   Anemia   Generalized weakness   Arthritis   Cholecystitis  with cholelithiasis    Time spent: >35 minutes     Kinnie Feil  [facility] Pager [phone]. If 7PM-7AM, please contact night-coverage at [email or web address], password [facility] 07/29/2013, 10:10 AM  LOS: 4 days

## 2013-07-29 NOTE — Progress Notes (Addendum)
4 Days Post-Op  Subjective: I appreciate all the input and management assistance from internal medicine, case management, diabetes program.  Patient and wife are now considering SNF placement. Considering his acute and chronic deconditioning, I think that is the best solution. He states that they will make a final decision today.  Patient states he feels a little better. No nausea or vomiting. Pain a little better. Had a good bowel movement after enema, according to him.  Chest x-ray showed some atelectasis but no pneumonia. Abdominal x-rays show a colonic ileus, which is a chronic problem.Lab work looked good, with hemoglobin 10.1, WBC 8700, normal liver function tests.Urinalysis shows lots of glycosuria but no evidence of infection.  Objective: Vital signs in last 24 hours: Temp:  [99.1 F (37.3 C)-99.9 F (37.7 C)] 99.1 F (37.3 C) (02/10 0534) Pulse Rate:  [100-107] 102 (02/10 0534) Resp:  [18] 18 (02/10 0534) BP: (133-139)/(51-74) 133/51 mmHg (02/10 0534) SpO2:  [99 %-100 %] 99 % (02/10 0534) Weight:  [144 lb 6.4 oz (65.5 kg)] 144 lb 6.4 oz (65.5 kg) (02/09 1100) Last BM Date: 07/28/13  Intake/Output from previous day: 02/09 0701 - 02/10 0700 In: 1088.3 [P.O.:400; I.V.:688.3] Out: 3085 [Urine:3075; Drains:10] Intake/Output this shift: Total I/O In: 688.3 [I.V.:688.3] Out: 1300 [Urine:1300]    EXAM: General appearance: awake and alert and cooperative. In no distress. Lying in bed. Somewhat feeble. GI: abdomen is softer, nontender, still somewhat distended, hypoactive bowel sounds. Wounds are clean. JP drainage serosanguineous, low-volume.  Lab Results:  Results for orders placed during the hospital encounter of 07/25/13 (from the past 24 hour(s))  GLUCOSE, CAPILLARY     Status: Abnormal   Collection Time    07/28/13  7:43 AM      Result Value Range   Glucose-Capillary 175 (*) 70 - 99 mg/dL  CBC     Status: Abnormal   Collection Time    07/28/13  9:15 AM      Result  Value Range   WBC 8.7  4.0 - 10.5 K/uL   RBC 3.62 (*) 4.22 - 5.81 MIL/uL   Hemoglobin 10.1 (*) 13.0 - 17.0 g/dL   HCT 30.7 (*) 39.0 - 52.0 %   MCV 84.8  78.0 - 100.0 fL   MCH 27.9  26.0 - 34.0 pg   MCHC 32.9  30.0 - 36.0 g/dL   RDW 15.2  11.5 - 15.5 %   Platelets 233  150 - 400 K/uL  COMPREHENSIVE METABOLIC PANEL     Status: Abnormal   Collection Time    07/28/13  9:15 AM      Result Value Range   Sodium 134 (*) 137 - 147 mEq/L   Potassium 5.1  3.7 - 5.3 mEq/L   Chloride 99  96 - 112 mEq/L   CO2 22  19 - 32 mEq/L   Glucose, Bld 194 (*) 70 - 99 mg/dL   BUN 19  6 - 23 mg/dL   Creatinine, Ser 1.28  0.50 - 1.35 mg/dL   Calcium 8.7  8.4 - 10.5 mg/dL   Total Protein 7.0  6.0 - 8.3 g/dL   Albumin 2.7 (*) 3.5 - 5.2 g/dL   AST 27  0 - 37 U/L   ALT 23  0 - 53 U/L   Alkaline Phosphatase 99  39 - 117 U/L   Total Bilirubin 1.1  0.3 - 1.2 mg/dL   GFR calc non Af Amer 53 (*) >90 mL/min   GFR calc Af Amer 61 (*) >  90 mL/min  LIPASE, BLOOD     Status: None   Collection Time    07/28/13  9:15 AM      Result Value Range   Lipase 11  11 - 59 U/L  URINALYSIS, ROUTINE W REFLEX MICROSCOPIC     Status: Abnormal   Collection Time    07/28/13 11:21 AM      Result Value Range   Color, Urine YELLOW  YELLOW   APPearance CLEAR  CLEAR   Specific Gravity, Urine 1.014  1.005 - 1.030   pH 6.5  5.0 - 8.0   Glucose, UA >1000 (*) NEGATIVE mg/dL   Hgb urine dipstick SMALL (*) NEGATIVE   Bilirubin Urine NEGATIVE  NEGATIVE   Ketones, ur NEGATIVE  NEGATIVE mg/dL   Protein, ur NEGATIVE  NEGATIVE mg/dL   Urobilinogen, UA 0.2  0.0 - 1.0 mg/dL   Nitrite NEGATIVE  NEGATIVE   Leukocytes, UA TRACE (*) NEGATIVE  URINE MICROSCOPIC-ADD ON     Status: Abnormal   Collection Time    07/28/13 11:21 AM      Result Value Range   Squamous Epithelial / LPF FEW (*) RARE   WBC, UA 0-2  <3 WBC/hpf   RBC / HPF 0-2  <3 RBC/hpf   Bacteria, UA RARE  RARE   Urine-Other MUCOUS PRESENT    GLUCOSE, CAPILLARY     Status:  Abnormal   Collection Time    07/28/13 12:20 PM      Result Value Range   Glucose-Capillary 231 (*) 70 - 99 mg/dL  GLUCOSE, CAPILLARY     Status: Abnormal   Collection Time    07/28/13  4:18 PM      Result Value Range   Glucose-Capillary 298 (*) 70 - 99 mg/dL  GLUCOSE, CAPILLARY     Status: Abnormal   Collection Time    07/28/13  7:46 PM      Result Value Range   Glucose-Capillary 184 (*) 70 - 99 mg/dL  GLUCOSE, CAPILLARY     Status: Abnormal   Collection Time    07/28/13 11:24 PM      Result Value Range   Glucose-Capillary 167 (*) 70 - 99 mg/dL  GLUCOSE, CAPILLARY     Status: Abnormal   Collection Time    07/29/13  3:42 AM      Result Value Range   Glucose-Capillary 145 (*) 70 - 99 mg/dL     Studies/Results: @RISRSLT24 @  . amLODipine  10 mg Oral q morning - 10a  . atorvastatin  20 mg Oral QPM  . cefTRIAXone (ROCEPHIN)  IV  1 g Intravenous Q24H  . darifenacin  7.5 mg Oral Daily  . docusate sodium  100 mg Oral BID  . enoxaparin (LOVENOX) injection  40 mg Subcutaneous Q24H  . insulin aspart  0-9 Units Subcutaneous Q4H  . insulin detemir  5 Units Subcutaneous BID  . irbesartan  300 mg Oral QHS  . latanoprost  1 drop Both Eyes QHS  . metoCLOPramide (REGLAN) injection  5 mg Intravenous Q6H  . polyethylene glycol  17 g Oral BID  . predniSONE  5 mg Oral Q breakfast  . saccharomyces boulardii  250 mg Oral BID  . tamsulosin  0.4 mg Oral BID     Assessment/Plan: s/p Procedure(s): LAPAROSCOPIC CHOLECYSTECTOMY WITH INTRAOPERATIVE CHOLANGIOGRAM   POD #4. Laparoscopic lysis of adhesions and laparoscopic cholecystectomy with cholangiogram. No apparent direct surgical complication  Advance diet  Acute ileus superimposed on chronic constipation.  X-rays reveal  colonic ileus without impaction, which is a chronic problem. This is improving clinically Discontinue narcotics and substitute Toradol  Continue Reglan  try enema and MiraLAX.  Thorazine for hiccoughs   Acute  deconditioning superimposed on chronic deconditioning, walker dependent. At this time patient is reconsidering SNF. Both PT and I are concerned about fall risk.   History of cerebrovascular accident with right-sided weakness and history of polio.   IDDM - continue SSI. Will need to be adjusted assuming diet is advanced. Recommendations from diabetes team noted. At this time I will defer management of diabetes and insulin orders to the [facility] hospitalist team.  HTN - Reasonable control on norvasc. Per medicine.  Borderline tachycardia. Sinus tachycardia by EKG. [place] compared to November, 2014.  History recent hospitalization(Nov., 2014) for acute cholecystitis with percutaneous drainage and enterococcus UTI.  Arthritis - on chronic prednisone. Remote history laparotomy and lysis of adhesions for SBO, 2002.  BPH on flomax     @PROBHOSP @  LOS: 4 days    Mike Mack M 07/29/2013  . .prob

## 2013-07-29 NOTE — Progress Notes (Signed)
PT Cancellation Note  Patient Details Name: Mike Mack MRN: [id number] DOB: 08/12/1935   Cancelled Treatment:    Reason Eval/Treat Not Completed: Medical issues which prohibited therapy (HR 125 at rest, RN notified, will follow.)   Blondell Reveal Kistler 07/29/2013, 10:05 AM [phone]

## 2013-07-30 LAB — GLUCOSE, CAPILLARY
GLUCOSE-CAPILLARY: 135 mg/dL — AB (ref 70–99)
Glucose-Capillary: 187 mg/dL — ABNORMAL HIGH (ref 70–99)
Glucose-Capillary: 142 mg/dL — ABNORMAL HIGH (ref 70–99)

## 2013-07-30 MED ORDER — IBUPROFEN 400 MG PO TABS
400.0000 mg | ORAL_TABLET | Freq: Four times a day (QID) | ORAL | Status: DC | PRN
  Administered 2013-07-30: 800 mg via ORAL
  Filled 2013-07-30: qty 2

## 2013-07-30 NOTE — Discharge Instructions (Signed)
(   SEE ABOVE )

## 2013-07-30 NOTE — Progress Notes (Signed)
Physical Therapy Treatment Patient Details Name: Mike Mack MRN: [id number] DOB: 15-Apr-1936 Today's Date: 07/30/2013 Time: 1435-1450 PT Time Calculation (min): 15 min  PT Assessment / Plan / Recommendation  History of Present Illness Pt is a 78 y/o male admitted s/p laparoscopic cholecystectomy with intraoperative cholangiogram.   PT Comments   Patient agreeable to walk to recliner but did not want to attempt any further ambulation. Patient awaiting insurance approval for [facility]. Continue with current POC  Follow Up Recommendations  SNF     Does the patient have the potential to tolerate intense rehabilitation     Barriers to Discharge        Equipment Recommendations       Recommendations for Other Services    Frequency Min 2X/week   Progress towards PT Goals Progress towards PT goals: Progressing toward goals  Plan Current plan remains appropriate    Precautions / Restrictions Precautions Precautions: Fall   Pertinent Vitals/Pain 7/10 stomach pain. RN in room medicating patient    Mobility  Bed Mobility Overal bed mobility: Needs Assistance Bed Mobility: Supine to Sit Supine to sit: Min assist General bed mobility comments: A for trunk control and to use chuck pad to scoot to EOB Transfers Equipment used: Rolling walker (2 wheeled) Sit to Stand: Mod assist General transfer comment: VC's for hand placement on seated surface for safety. Assist for power-up to full stand.  Ambulation/Gait Ambulation/Gait assistance: Min assist Ambulation Distance (Feet): 15 Feet Assistive device: Rolling walker (2 wheeled) Gait Pattern/deviations: Step-to pattern;Shuffle Gait velocity: Decreased Gait velocity interpretation: Below normal speed for age/gender General Gait Details: Cues for posture and safety with RW. Patient with weight through heels and posterior lean    Exercises     PT Diagnosis:    PT Problem List:   PT Treatment Interventions:     PT Goals  (current goals can now be found in the care plan section)    Visit Information  Last PT Received On: 07/30/13 Assistance Needed: +1 History of Present Illness: Pt is a 78 y/o male admitted s/p laparoscopic cholecystectomy with intraoperative cholangiogram.    Subjective Data      Cognition  Cognition Arousal/Alertness: Awake/alert Behavior During Therapy: WFL for tasks assessed/performed Overall Cognitive Status: Within Functional Limits for tasks assessed    Balance     End of Session PT - End of Session Equipment Utilized During Treatment: Gait belt Activity Tolerance: Patient limited by fatigue;Patient limited by pain Patient left: in chair;with call bell/phone within reach Nurse Communication: Mobility status   GP     Jacqualyn Posey 07/30/2013, 3:04 PM 07/30/2013 Jacqualyn Posey PTA [phone] pager [phone] office

## 2013-07-30 NOTE — Progress Notes (Signed)
Noted patient is to be discharged to SNF today. Agree with no further antibiotics for ?UTI as his urine cx has returned negative. Would continue metoprolol at current dose of 12.5 mg BID as his BP can certainly tolerate it. Will sign off at this time. Please call us back with any questions.  Domingo Mend, MD [facility] Pager: [phone]

## 2013-07-30 NOTE — Discharge Summary (Signed)
Patient ID: Mike Mack [id number] 77 y.o. 1935-12-01  Admit date: 07/25/2013  Discharge date and time: 07/30/2013  Admitting Physician: [name]  Discharge Physician: [name]  Admission Diagnoses: cholelithiasis   Discharge Diagnoses: Acute and chronic cholecystitis with cholelithiasis Acute ileus superimposed on chronic constipation, resolved. Acute deconditioning superimposed on chronic deconditioning, Mike Mack dependent History cerebrovascular accident with right-sided weakness History of polio Insulin-dependent diabetes mellitus Hypertension Arthritis, on chronic prednisone Remote history of laparotomy and lysis of adhesions for small bowel junction, 2002 Benign prostatic hypertrophy on Flomax Suspected urinary tract infection, treated  Operations: Procedure(s): LAPAROSCOPIC CHOLECYSTECTOMY WITH INTRAOPERATIVE CHOLANGIOGRAM  Admission Condition: fair  Discharged Condition: fair  Indication for Admission: Mike Mack is a 78 y.o. male.  This gentleman was hospitalized on 05/13/2013 with back pain, enterococcus UTI, constipation, abdominal pain, abdominal distention. Workup showed a WBC of 18,900, normal liver function tests, normal lipase. CT scan showed a huge stool burden, distention of the small bowel, distention of the gallbladder, gallbladder wall thickening, and gallstones. He was tender locally in the right upper quadrant. Chest x-ray showed basilar atelectasis and possible pneumonia. Because of the complex of the of his medical problems and abdominal distention we elected to treat his presumed acute cholecystitis with cholelithiasis with a percutaneous drain and antibiotics. He improved and resumed bowel function and diet and was discharged on 05/19/2013. Since that time his medical condition has stabilized. He has no pain. Marland Kitchen He enjoys normal diet and normal bowel function. He saw Dr. Wilson Singer and was told that his medical conditions are stable.  He  went for a drain study but the pigtail catheter had become dislodged and was external to the gallbladder, and so the drain was removed. He remains stable. Hasn't had any more attacks of abdominal pain. Tolerating diet. He ambulates with a cane some days and a walker other days.  He has numerous comorbidities including insulin-dependent diabetes mellitus, hypertension, history of stroke with persistent right-sided weakness, ambulates with a walker, recent enterococcus UTI, remote history of polio history of right inguinal hernia repair, history of exploratory laparotomy with lysis of adhesions in 2002 for SBO(Mike Mack)  I have followed him as an outpatient and have offered cholecystectomy to him better. Both patient and his wife would like to proceed and he is brought to the hospital electively.   Hospital Course:    On the day of admission the patient was taken to the operating room and underwent a laparoscopic evaluation. Extensive lysis of adhesions, laparoscopic cholecystectomy with cholangiogram. He had extensive adhesions. The cholecystectomy was somewhat difficult, the cholangiogram was normal. A drain was left. Postoperatively the patient was stable. He did have fever postop which has resolved.He was started on Rocephin empirically but the urine culture is negative. Chest x-ray showed some atelectasis and I think  that was the cause of his fever.     He became distended postoperatively. Clinically this was an ileus. This resolved following increase in activity and discontinuation of narcotics. . This is a recurrent problem. The time of discharge he was tolerating a regular diet and had had several bowel movements and felt much better. The Jackson-Pratt drain never drained any abnormal fluid, just serosanguineous fluid, and that'll be discontinued at the time of discharge.  He was followed by the [facility] hospital[facility] during his hospitalization. They initiated the antibiotic presumptively for possible  recurrent UTI.  which will probably not be necessary. They initiated a low dose beta blocker because of tachycardia and we'll address that concern prior  to discharge. His chronic use of prednisone will be addressed as an outpatient by Dr. Wilson Singer his PCP.  Insulin-dependent diabetes was managed by surgery and by medicine. This was adjusted as his diet advanced. We had good control during the hospitalization.  During his hospitalization he was significantly deconditioned. He was evaluated by PT and OT. SNF was recommended. This was discussed with the patient and his wife. They were in agreement and placement of a was successful [place].  At the time of discharge the patient is tolerating a regular diet, is afebrile and stable and comfortable and alert. He is comfortable with discharge plans. Diet and activities and medications are discussed. He is asked to return to see me in the office in 3 weeks.  Consults: internal medicine, [facility]  Significant Diagnostic Studies: Surgical pathology, intraoperative cholangiogram, laboratory testing. Chest x-ray. EKG.  Treatments: surgery: Laparoscopic cholecystectomy with cholangiogram, laparoscopic lyses of adhesions  Disposition: Nursing Home  Patient Instructions:    Medication List         amLODipine 5 MG tablet  Commonly known as:  NORVASC  Take 5 mg by mouth every morning.     atorvastatin 20 MG tablet  Commonly known as:  LIPITOR  Take 1 tablet by mouth every evening.     DSS 100 MG Caps  Take 100 mg by mouth 2 (two) times daily.     gabapentin 800 MG tablet  Commonly known as:  NEURONTIN  Take 1 tablet by mouth 3 (three) times daily as needed (HEADACHES).     irbesartan 300 MG tablet  Commonly known as:  AVAPRO  Take 300 mg by mouth at bedtime.     LEVEMIR FLEXPEN 100 UNIT/ML Pen  Generic drug:  Insulin Detemir  Inject 50 Units into the skin 2 (two) times daily.     polyethylene glycol packet  Commonly  known as:  MIRALAX / GLYCOLAX  Take 17 g by mouth 3 (three) times daily.     predniSONE 5 MG tablet  Commonly known as:  DELTASONE  Take 5 mg by mouth daily with breakfast.     saccharomyces boulardii 250 MG capsule  Commonly known as:  FLORASTOR  Take 250 mg by mouth 2 (two) times daily.     solifenacin 10 MG tablet  Commonly known as:  VESICARE  Take 10 mg by mouth every evening.     tamsulosin 0.4 MG Caps capsule  Commonly known as:  FLOMAX  Take 0.4 mg by mouth 2 (two) times daily.     Travoprost (BAK Free) 0.004 % Soln ophthalmic solution  Commonly known as:  TRAVATAN  Place 1 drop into both eyes at bedtime.        Activity: aambulate with walker and supervision by PT and OT. 15 pound weight  limit lifting. No driving. Diet: carbonate modified diet Wound Care: none needed  Follow-up:  With [name] in 3 weeks.  Signed: Edsel Petrin. Dalbert Batman, M.D., FACS General and minimally invasive surgery Breast and Colorectal Surgery  07/30/2013, 6:10 AM

## 2013-07-30 NOTE — Clinical Social Work Note (Addendum)
CSW has spoken to pt's wife regarding discharge planning. Pt's wife stated that she and pt would like for him to be discharged to [facility]. CSW has sent discharge summary to [facility]. Per admissions coordinator, [facility] will call CSW once insurance is approved and will give CSW the verbal "okay" to discharge pt to [facility].  CSW to continue to follow and assist with discharge planning needs.  Update: [facility] contacted CSW with "okay" for pt to discharge to their facility on 07/30/2013. CSW contacted pt's wife regarding information. Pt's wife to arrive at [facility] at 4pm (once she gets off work) to transport pt to SNF. CSW made RN aware of information above and below. CSW has completed discharge packet and placed on pt's shadow chart.  RN please call report to [facility] at [phone]  **If pt's wife is to change her mind and request ambulance transportation, CSW has completed ambulance form and attached to pt's discharge packet. RN will need to call [facility] (ambulance) at [phone] to set up ambulance transportation.**  Pati Gallo, [place] Social Worker [phone]

## 2013-07-30 NOTE — Progress Notes (Signed)
Pt discharged in stable condition via EMS to [facility].  Report called and discharge packet sent via EMS with pt.  Eliezer [place]

## 2013-08-01 ENCOUNTER — Encounter: Payer: Self-pay | Admitting: Internal Medicine

## 2013-08-01 ENCOUNTER — Non-Acute Institutional Stay (SKILLED_NURSING_FACILITY): Payer: Medicare Other | Admitting: Internal Medicine

## 2013-08-01 DIAGNOSIS — R531 Weakness: Secondary | ICD-10-CM

## 2013-08-01 DIAGNOSIS — E119 Type 2 diabetes mellitus without complications: Secondary | ICD-10-CM

## 2013-08-01 DIAGNOSIS — R5381 Other malaise: Secondary | ICD-10-CM

## 2013-08-01 DIAGNOSIS — N400 Enlarged prostate without lower urinary tract symptoms: Secondary | ICD-10-CM

## 2013-08-01 DIAGNOSIS — K8010 Calculus of gallbladder with chronic cholecystitis without obstruction: Secondary | ICD-10-CM

## 2013-08-01 DIAGNOSIS — K5900 Constipation, unspecified: Secondary | ICD-10-CM

## 2013-08-01 DIAGNOSIS — I10 Essential (primary) hypertension: Secondary | ICD-10-CM

## 2013-08-01 DIAGNOSIS — R5383 Other fatigue: Secondary | ICD-10-CM

## 2013-08-01 NOTE — Progress Notes (Signed)
Patient ID: Mike Mack, male   DOB: 1936-05-07, 78 y.o.   MRN: [id number]    Mike Mack living [place]    PCP: [name], MD  Code Status: full code  No Known Allergies  Chief Complaint: new admission  HPI:  78 y/o male patient is here for STR after hospital admission from 07/25/13- 07/30/13 for elective laproscopic cholecystectomy with cholangiogram. He tolerated the procedure well.   Review of Systems:  Constitutional: Negative for fever, chills, diaphoresis.  HENT: Negative for congestion, hearing loss and sore throat.   Eyes: Negative for blurred vision, double vision and discharge.  Respiratory: Negative for cough, sputum production, shortness of breath and wheezing.   Cardiovascular: Negative for chest pain, palpitations, orthopnea and leg swelling.  Gastrointestinal: Negative for heartburn, nausea, vomiting, diarrhea  Genitourinary: Negative for dysuria, urgency, frequency and flank pain.  Musculoskeletal: Negative for back pain, falls, joint pain and myalgias.  Skin: Negative for itching and rash.  Neurological: Positive for weakness. Negative for dizziness, tingling, focal weakness and headaches.  Psychiatric/Behavioral: Negative for depression and memory loss. The patient is not nervous/anxious.     Past Medical History  Diagnosis Date  . Hypertension   . Small bowel obstruction   . BPH (benign prostatic hyperplasia)   . Neuropathy   . Polio   . Polio Childhood  . Constipation 05/13/2013  . Anemia 05/13/2013  . Type II diabetes mellitus   . Exertional shortness of breath     "sometimes" (07/25/2013)  . History of stomach ulcers   . Stroke 2014    residual:  "left hand kind of numb" (07/25/2013)  . Arthritis     "right leg" (07/25/2013)   Past Surgical History  Procedure Laterality Date  . Bowel resection    . Cataract extraction w/ intraocular lens  implant, bilateral Bilateral 2000's  . Colonoscopy      Hx: of  . Laparoscopic cholecystectomy   07/25/2013    w/LOA (07/25/2013)  . Excisional hemorrhoidectomy  2000's  . Colon surgery    . Inguinal hernia repair Right 1970's  . Cholecystectomy N/A 07/25/2013    Procedure: LAPAROSCOPIC CHOLECYSTECTOMY WITH INTRAOPERATIVE CHOLANGIOGRAM;  Surgeon: [name], MD;  Location: [facility];  Service: General;  Laterality: N/A;   Social History:   reports that he has never smoked. He has never used smokeless tobacco. He reports that he does not drink alcohol or use illicit drugs.  Family History  Problem Relation Age of Onset  . Diabetes Sister   . Diabetes Sister   . Diabetes Brother   . Hypertension Sister   . Hypertension Sister   . Hypertension Brother   . Heart failure Mother   . Heart attack Sister     Medications: Patient's Medications  New Prescriptions   No medications on file  Previous Medications   AMLODIPINE (NORVASC) 5 MG TABLET    Take 5 mg by mouth every morning.    ATORVASTATIN (LIPITOR) 20 MG TABLET    Take 1 tablet by mouth every evening.   DOCUSATE SODIUM 100 MG CAPS    Take 100 mg by mouth 2 (two) times daily.   GABAPENTIN (NEURONTIN) 800 MG TABLET    Take 1 tablet by mouth 3 (three) times daily as needed (HEADACHES).    INSULIN DETEMIR (LEVEMIR FLEXPEN) 100 UNIT/ML PEN    Inject 50 Units into the skin 2 (two) times daily.   IRBESARTAN (AVAPRO) 300 MG TABLET    Take 300 mg by mouth at bedtime.  POLYETHYLENE GLYCOL (MIRALAX / GLYCOLAX) PACKET    Take 17 g by mouth 3 (three) times daily.   PREDNISONE (DELTASONE) 5 MG TABLET    Take 5 mg by mouth daily with breakfast.   SACCHAROMYCES BOULARDII (FLORASTOR) 250 MG CAPSULE    Take 250 mg by mouth 2 (two) times daily.   SOLIFENACIN (VESICARE) 10 MG TABLET    Take 10 mg by mouth every evening.   TAMSULOSIN HCL (FLOMAX) 0.4 MG CAPS    Take 0.4 mg by mouth 2 (two) times daily.    TRAVOPROST, BAK FREE, (TRAVATAN) 0.004 % SOLN OPHTHALMIC SOLUTION    Place 1 drop into both eyes at bedtime.  Modified Medications   No  medications on file  Discontinued Medications   No medications on file     Physical Exam: Filed Vitals:   08/01/13 0910  BP: 128/77  Pulse: 78  Temp: 97 F (36.1 C)  Resp: 18  Weight: 142 lb (64.411 kg)  SpO2: 95%   General- elderly male in no acute distress, appears frail Head- atraumatic, normocephalic Eyes- PERRLA, EOMI, no pallor, no icterus Neck- no lymphadenopathy Cardiovascular- normal s1,s2, no murmurs/ rubs/ gallops Respiratory- bilateral clear to auscultation, no wheeze, no rhonchi, no crackles Abdomen- bowel sounds present, soft, distended, non tender, incision site healed well, dressing at tube site healing well Musculoskeletal- able to move all 4 extremities, weakness present Neurological- no focal deficit Psychiatry- alert and oriented to person and place at present, normal mood and affect   Labs reviewed: Basic Metabolic Panel:  Recent Labs  05/13/13 0935  07/26/13 0600 07/27/13 0411 07/28/13 0915  NA 132*  < > 142 134* 134*  K 4.2  < > 4.4 4.9 5.1  CL 95*  < > 105 99 99  CO2 26  < > 24 24 22   GLUCOSE 62*  < > 43* 155* 194*  BUN 22  < > 22 20 19   CREATININE 1.45*  < > 1.23 1.32 1.28  CALCIUM 9.5  < > 8.8 9.0 8.7  MG 2.6*  --   --   --   --   < > = values in this interval not displayed. Liver Function Tests:  Recent Labs  05/15/13 0755 05/17/13 0405 07/28/13 0915  AST 46* 25 27  ALT 34 27 23  ALKPHOS 84 72 99  BILITOT 0.8 0.5 1.1  PROT 6.6 6.1 7.0  ALBUMIN 2.5* 2.4* 2.7*    Recent Labs  05/13/13 0935 07/28/13 0915  LIPASE 9* 11   No results found for this basename: AMMONIA,  in the last 8760 hours CBC:  Recent Labs  03/27/13 2038 05/10/13 0055 05/13/13 0935  07/26/13 0600 07/27/13 0411 07/28/13 0915  WBC 7.1 7.6 18.9*  < > 14.4* 11.8* 8.7  NEUTROABS 4.4 5.4 16.8*  --   --   --   --   HGB 10.4* 11.5* 10.6*  < > 10.4* 11.2* 10.1*  HCT 31.5* 35.3* 31.9*  < > 32.5* 34.2* 30.7*  MCV 92.6 91.2 89.4  < > 86.4 86.6 84.8  PLT  253 247 236  < > 303 277 233  < > = values in this interval not displayed. Cardiac Enzymes: No results found for this basename: CKTOTAL, CKMB, CKMBINDEX, TROPONINI,  in the last 8760 hours BNP: No components found with this basename: POCBNP,  CBG:  Recent Labs  07/30/13 0801 07/30/13 1146 07/30/13 1711  GLUCAP 135* 187* 142*     Assessment/Plan  Generalized weakness- post  surgery, will have him undergo rehabilitation, to work with PT and OT, skin care and fall precautions  Hypertension- continue amlodipine 5 mg daily and irbesartan 300 mg daily  Dm type 2- monitor cbg, continue levemir  BPH- continue tamsulosin and vesicare for now  Hyperlipidemia- continue his statin  Constipation- regular bowel movement with help of docusate and miralax  Cholecystitis- s/p cholecystectomy, monitor for fever and wbc curve, monitor bowel movement and po intake. Currently asymptomatic and surgery site healing well.  Family/ staff Communication: reviewed care plan with patient and nursing supervisor  Goals of care: STR   Labs/tests ordered- cbc, bmp, lft

## 2013-08-07 ENCOUNTER — Encounter: Payer: Self-pay | Admitting: Internal Medicine

## 2013-08-07 ENCOUNTER — Non-Acute Institutional Stay (SKILLED_NURSING_FACILITY): Payer: Medicare Other | Admitting: Internal Medicine

## 2013-08-07 DIAGNOSIS — R739 Hyperglycemia, unspecified: Secondary | ICD-10-CM

## 2013-08-07 DIAGNOSIS — D72829 Elevated white blood cell count, unspecified: Secondary | ICD-10-CM

## 2013-08-07 DIAGNOSIS — D62 Acute posthemorrhagic anemia: Secondary | ICD-10-CM

## 2013-08-07 DIAGNOSIS — E871 Hypo-osmolality and hyponatremia: Secondary | ICD-10-CM

## 2013-08-07 DIAGNOSIS — R7309 Other abnormal glucose: Secondary | ICD-10-CM

## 2013-08-07 DIAGNOSIS — B370 Candidal stomatitis: Secondary | ICD-10-CM

## 2013-08-07 NOTE — Progress Notes (Signed)
Patient ID: Mike Mack, male   DOB: Dec 12, 1935, 78 y.o.   MRN: [id number]    Mike Mack living [place]  Chief Complaint  Patient presents with  . Acute Visit    confusion, increased white count   No Known Allergies  Code status- full code  HPI 78 y/o male pt here for STR after cholecystectomy. He has been having some change in his mental status with decline in po intake for past 3 days. His labs were reviewed and noted to have elevated wbc. He has constipation, last bowel movement yesterday. He has pain/ discomfort in his mouth and difficulty swallowing. Has epigastric abdominal discomfort. No nausea or vomiting. Denies any chills. Has remained afebrile but is weak. He denies any other complaints at present  Review of Systems  Constitutional: Negative for fever, chills, diaphoresis.  HENT: Negative for congestion, hearing loss and sore throat.   Eyes: Negative for blurred vision, double vision and discharge.  Respiratory: Negative for cough, sputum production, shortness of breath and wheezing.   Cardiovascular: Negative for chest pain, palpitations, orthopnea and leg swelling.  Gastrointestinal: Negative for heartburn, nausea, vomiting, diarrhea Genitourinary: Negative for dysuria, urgency, frequency and flank pain.  Musculoskeletal: Negative for back pain, falls, joint pain and myalgias.  Skin: Negative for itching and rash.  Neurological: Positive for weakness. Negative for dizziness, tingling, focal weakness and headaches.  Psychiatric/Behavioral: Negative for depression and memory loss. The patient is not nervous/anxious.    Past Medical History  Diagnosis Date  . Hypertension   . Small bowel obstruction   . BPH (benign prostatic hyperplasia)   . Neuropathy   . Polio   . Polio Childhood  . Constipation 05/13/2013  . Anemia 05/13/2013  . Type II diabetes mellitus   . Exertional shortness of breath     "sometimes" (07/25/2013)  . History of stomach ulcers   . Stroke 2014     residual:  "left hand kind of numb" (07/25/2013)  . Arthritis     "right leg" (07/25/2013)   Medication reviewed. See MAR  Physical exam BP 140/78  Pulse 98  Temp(Src) 98.1 F (36.7 C)  Resp 18  SpO2 96%  General- elderly male in no acute distress, appears frail Head- atraumatic, normocephalic Mouth- white coating in the tongue noted Eyes- PERRLA, EOMI, no pallor, no icterus Neck- no lymphadenopathy Cardiovascular- normal s1,s2, no murmurs/ rubs/ gallops Respiratory- bilateral clear to auscultation, no wheeze, no rhonchi, no crackles Abdomen- bowel sounds present, soft, distended, non tender, incision site healed well, dressing at tube site healing well Musculoskeletal- able to move all 4 extremities, weakness present Neurological- no focal deficit Psychiatry- alert and oriented to person and place at present, normal mood and affect  Labs- CBC    Component Value Date/Time   WBC 8.7 07/28/2013 0915   RBC 3.62* 07/28/2013 0915   HGB 10.1* 07/28/2013 0915   HCT 30.7* 07/28/2013 0915   PLT 233 07/28/2013 0915   MCV 84.8 07/28/2013 0915   MCH 27.9 07/28/2013 0915   MCHC 32.9 07/28/2013 0915   RDW 15.2 07/28/2013 0915   LYMPHSABS 0.8 05/13/2013 0935   MONOABS 1.2* 05/13/2013 0935   EOSABS 0.0 05/13/2013 0935   BASOSABS 0.0 05/13/2013 0935   CMP     Component Value Date/Time   NA 134* 07/28/2013 0915   K 5.1 07/28/2013 0915   CL 99 07/28/2013 0915   CO2 22 07/28/2013 0915   GLUCOSE 194* 07/28/2013 0915   BUN 19 07/28/2013 0915   CREATININE  1.28 07/28/2013 0915   CALCIUM 8.7 07/28/2013 0915   PROT 7.0 07/28/2013 0915   ALBUMIN 2.7* 07/28/2013 0915   AST 27 07/28/2013 0915   ALT 23 07/28/2013 0915   ALKPHOS 99 07/28/2013 0915   BILITOT 1.1 07/28/2013 0915   GFRNONAA 53* 07/28/2013 0915   GFRAA 61* 07/28/2013 0915   Lab Results  Component Value Date   HGBA1C 7.1* 07/25/2013     08/04/13 wbc 20.6, hb 8.5, hct 25.9, plt 636, granulocyte 85, na 131, k 4.8, co2 25, glu 192, bun 18, cr 1.04, ca 7.9, t.bil 1, alp  173, ast 65, alt 73, t.pro 6, alb 2.4 08/06/13 wbc 20.1, hb 8.9, hct 27.3, plt 745, na 133, k 4.9, glu 242, bun 26, cr 1.3, t.bil 0.8, alp 186, ast 65, alt 90, t.pro 5.9, alb 2.4, ca 8  Assessment/plan  leukocytosis- with some confusion , abdominal discomfort rising concern for infection. Normal lung exam. No urinary cmplaints. With recent cholecystectomy, concern of post op complication with intra-abdominal infection with LFT rising as well. seeing surgery team tomorrow for post op follow up. With rise in white count with left shift and confusion, rise in LFT will cover him empirically with antibiotics. It is fdifficult for him to take oral medication due to the thrush will have him on iv ciprofloxacin 400 mg bid and flagyl 500 mg qid for possible intra-abdominal infection. Will recheck cbc with diff and cmp in am. He might need a follow up USG abdomen or CT abdomen to assess further if no improvement. His being on chronic steroid could be contributing some. Monitor wbc and temp curve. Will start iv fluids for now  Hyponatremia- with poor po intake, concern for dehydration/ hypovolemia contributing to this. Will have him on NS @ 75 cc/hr for 1 litre for now and reassess  Oral thrush- will start him on fluconazole 150 mg daily x 3 days and nystatin swish and spit bid for now. Reassess if no improvement  Hyperglycemia- will change his novolog to 5 u tid with meals for now. His infection could be contributing to this  Anemia- likely from post op blood loss. Currently hb > 8. Monitor clinically   Blanchie Serve, MD  [facility] [phone] (Monday-Friday 8 am - 5 pm) [phone] (afterhours)

## 2013-08-08 ENCOUNTER — Ambulatory Visit (INDEPENDENT_AMBULATORY_CARE_PROVIDER_SITE_OTHER): Payer: Medicare Other | Admitting: General Surgery

## 2013-08-08 ENCOUNTER — Telehealth (INDEPENDENT_AMBULATORY_CARE_PROVIDER_SITE_OTHER): Payer: Self-pay

## 2013-08-08 ENCOUNTER — Encounter (INDEPENDENT_AMBULATORY_CARE_PROVIDER_SITE_OTHER): Payer: Self-pay | Admitting: General Surgery

## 2013-08-08 ENCOUNTER — Encounter (INDEPENDENT_AMBULATORY_CARE_PROVIDER_SITE_OTHER): Payer: Self-pay

## 2013-08-08 VITALS — BP 142/66 | HR 112 | Temp 99.0°F | Resp 16

## 2013-08-08 DIAGNOSIS — Z9089 Acquired absence of other organs: Secondary | ICD-10-CM

## 2013-08-08 DIAGNOSIS — Z9049 Acquired absence of other specified parts of digestive tract: Secondary | ICD-10-CM

## 2013-08-08 DIAGNOSIS — K8010 Calculus of gallbladder with chronic cholecystitis without obstruction: Secondary | ICD-10-CM

## 2013-08-08 NOTE — Telephone Encounter (Signed)
Called and spoke to patient's wife regarding patients CT test.  Patient has been scheduled for [facility] [place] on 08/14/13 @ 11:45am.  Patient wife will pick up contrast from our office, aware that it's at the front desk with instructions ready for pick up.

## 2013-08-08 NOTE — Progress Notes (Signed)
Patient ID: Mike Mack, male   DOB: 07-11-1935, 78 y.o.   MRN: [id number] History: This 78 year old man recently underwent laparoscopic cholecystectomy. He has multiple comorbidities including chronic constipation, tendency for ileus, severe deconditioning, history of cerebrovascular accident with right-sided weakness, history of polio, insulin-dependent diabetes, hypertension, arthritis on chronic prednisone, remote history of laparotomy for small bowel obstruction, BPH. History enterococcus UTI.    He underwent elective laparoscopic lysis of adhesions, laparoscopic cholecystectomy and cholangiogram on 07/25/2012. This he required a five-day hospitalization to recover from postop ileus. PT and OT recommended SNF and he was transferred to [facility].    The patient's wife states in the patient confirms that he has some intermittent centralized abdominal pain. His appetite is not good. No real vomiting. He takes MiraLAX and has daily bowel movements. No diarrhea. No fever or chills.     In the notes from the nursing facility by Dr. Marcelino Scot, reference is made to leukocytosis, abnormal LFTs. Beverages made to starting empirically on Cipro and Flagyl. Reference is made to treating him for thrush. They deferred any GI workup to make.  Exam: Patient is deconditioned but a note of severe distress. Temp 99.0. Heart rate 112. Respirations 16. BP 142/66 Lungs clear to auscultation Abdomen is actually very soft and nontender. Chronically distended but not tympanitic or tight. Wounds look good. No tenderness or mass anywhere.  Assessment: Status post laparoscopic lysis of adhesions, laparoscopic cholecystectomy with cholangiogram. By physical exam and history, there is no gross evidence of surgical complications Chronic abdominal pain, significance unknown Chronic constipation, controlled with MiraLAX. Multiple comorbidities as listed above  Plan: Because he is immunocompromised on steroids, and  insulin-dependent diabetic, I'm going to go ahead and work him up, repeat his lab work and get a CT scan of abdomen pelvis to make sure were not missing an abscess. Return to see me in 3 weeks.   Edsel Petrin. Dalbert Batman, M.D., [facility] General and Minimally invasive Surgery Breast and Colorectal Surgery Office:   [phone] Pager:   [phone]

## 2013-08-08 NOTE — Patient Instructions (Signed)
Your physical exam today is normal. Your abdomen is soft and there is no clinical evidence of a surgical complication.  However, your appetite is not good and you state  your abdomen is sore. The doctor at the nursing facility says that you had some abnormal lab tests and they have started you on antibiotics for possible infection.  I am going to repeat some of your blood work and get a CT scan of the abdomen and pelvis to make sure we're not missing any complication.  Return to see [name] in 3 weeks.

## 2013-08-12 ENCOUNTER — Non-Acute Institutional Stay (SKILLED_NURSING_FACILITY): Payer: Medicare Other | Admitting: Internal Medicine

## 2013-08-12 DIAGNOSIS — R739 Hyperglycemia, unspecified: Secondary | ICD-10-CM

## 2013-08-12 DIAGNOSIS — E119 Type 2 diabetes mellitus without complications: Secondary | ICD-10-CM

## 2013-08-12 DIAGNOSIS — F05 Delirium due to known physiological condition: Secondary | ICD-10-CM

## 2013-08-12 DIAGNOSIS — R5381 Other malaise: Secondary | ICD-10-CM

## 2013-08-12 DIAGNOSIS — R7309 Other abnormal glucose: Secondary | ICD-10-CM

## 2013-08-12 DIAGNOSIS — B370 Candidal stomatitis: Secondary | ICD-10-CM

## 2013-08-12 DIAGNOSIS — D72829 Elevated white blood cell count, unspecified: Secondary | ICD-10-CM

## 2013-08-12 DIAGNOSIS — R410 Disorientation, unspecified: Secondary | ICD-10-CM

## 2013-08-12 NOTE — Progress Notes (Signed)
Patient ID: Mike Mack, male   DOB: Oct 24, 1935, 78 y.o.   MRN: [id number]  [facility] Provider:  Jonelle Sidle L. Mariea Clonts, D.O., C.M.D.  Code Status:  Full code  Chief Complaint  Patient presents with  . Acute Visit    hyperglycemia after insulin reduction    HPI:  78 yo male here for short term rehab after lap cholecystectomy was seen for acute visit due to hyperglycemia.  His mental state has been abnormal since at least 2/16.  He's had leukocytosis (but is on steroids).  At that time, there were concerns for an intra-abdominal infection so he was begun on IVF and empiric abx with cipro and flagyl IV.  Due to his immunocompromised state, [name] ordered a  CT abdomen/pelvis (scheduled for 08/14/13).  He is to f/u again in 3 wks with [name].  He also had hyponatremia which improved with hydration.  Oral thrush was interfering with oral intake and he was treated with fluconazole and nystatin with improvement as of today, but still some white patches on his tongue.   His insulin had been reduced to 5 units of meal coverage alone for cbgs over 200 due to hypoglycemic episodes, but now his levels are sky high in the 200s-300s consistently for the past 4 days--he remains on IV abx for possible abscess and prednisone.    Review of Systems:  Review of Systems  Constitutional: Positive for weight loss and malaise/fatigue. Negative for fever.  HENT: Negative for congestion.   Respiratory: Negative for shortness of breath.   Cardiovascular: Negative for chest pain.  Gastrointestinal: Positive for abdominal pain.  Skin: Negative for rash.       Serosanguinous drainage from his abdominal wound present on his tshirt  Neurological: Positive for weakness.  Endo/Heme/Allergies: Bruises/bleeds easily.  Psychiatric/Behavioral: Positive for depression.    Medications: Patient's Medications  New Prescriptions   No medications on file  Previous Medications   AMLODIPINE  (NORVASC) 5 MG TABLET    Take 5 mg by mouth every morning.    ATORVASTATIN (LIPITOR) 20 MG TABLET    Take 1 tablet by mouth every evening.   DOCUSATE SODIUM 100 MG CAPS    Take 100 mg by mouth 2 (two) times daily.   GABAPENTIN (NEURONTIN) 800 MG TABLET    Take 1 tablet by mouth 3 (three) times daily as needed (HEADACHES).    INSULIN ASPART (NOVOLOG) 100 UNIT/ML INJECTION    Inject 3 Units into the skin 3 (three) times daily before meals.   IRBESARTAN (AVAPRO) 300 MG TABLET    Take 300 mg by mouth at bedtime.   POLYETHYLENE GLYCOL (MIRALAX / GLYCOLAX) PACKET    Take 17 g by mouth 3 (three) times daily.   PREDNISONE (DELTASONE) 5 MG TABLET    Take 5 mg by mouth daily with breakfast.   SACCHAROMYCES BOULARDII (FLORASTOR) 250 MG CAPSULE    Take 250 mg by mouth 2 (two) times daily.   SKIN PROTECTANTS, MISC. (CALAZIME SKIN PROTECTANT) PSTE    Apply topically.   SOLIFENACIN (VESICARE) 10 MG TABLET    Take 10 mg by mouth every evening.   TAMSULOSIN HCL (FLOMAX) 0.4 MG CAPS    Take 0.4 mg by mouth 2 (two) times daily.    TRAVOPROST, BAK FREE, (TRAVATAN) 0.004 % SOLN OPHTHALMIC SOLUTION    Place 1 drop into both eyes at bedtime.   TUBERCULIN (APLISOL) 5 UNIT/0.1ML INJECTION    Inject into the skin once.  Modified Medications  No medications on file  Discontinued Medications   No medications on file    Physical Exam: Filed Vitals:   08/12/13 1101  BP: 141/60  Pulse: 84  Temp: 97.3 F (36.3 C)  Resp: 19  Height: 5\' 5"  (1.651 m)  Weight: 132 lb (59.875 kg)   Physical Exam  Constitutional:  Increasingly debilitated black male with right hemiparesis resting in bed  Cardiovascular: Normal rate, regular rhythm, normal heart sounds and intact distal pulses.   Pulmonary/Chest: Effort normal and breath sounds normal. No respiratory distress.  Abdominal: Soft. Bowel sounds are normal. He exhibits no distension and no mass. There is tenderness.  Musculoskeletal:  hemiparesis  Skin:  Incision site  w/o erythema, has eschar with serosanguinous drainage on his shirt; stools foul smelling  Psychiatric:  Lethargic, confused     Labs reviewed: Basic Metabolic Panel:  Recent Labs  05/13/13 0935  07/26/13 0600 07/27/13 0411 07/28/13 0915  NA 132*  < > 142 134* 134*  K 4.2  < > 4.4 4.9 5.1  CL 95*  < > 105 99 99  CO2 26  < > 24 24 22   GLUCOSE 62*  < > 43* 155* 194*  BUN 22  < > 22 20 19   CREATININE 1.45*  < > 1.23 1.32 1.28  CALCIUM 9.5  < > 8.8 9.0 8.7  MG 2.6*  --   --   --   --   < > = values in this interval not displayed.  Liver Function Tests:  Recent Labs  05/15/13 0755 05/17/13 0405 07/28/13 0915  AST 46* 25 27  ALT 34 27 23  ALKPHOS 84 72 99  BILITOT 0.8 0.5 1.1  PROT 6.6 6.1 7.0  ALBUMIN 2.5* 2.4* 2.7*    CBC:  Recent Labs  03/27/13 2038 05/10/13 0055 05/13/13 0935  07/26/13 0600 07/27/13 0411 07/28/13 0915  WBC 7.1 7.6 18.9*  < > 14.4* 11.8* 8.7  NEUTROABS 4.4 5.4 16.8*  --   --   --   --   HGB 10.4* 11.5* 10.6*  < > 10.4* 11.2* 10.1*  HCT 31.5* 35.3* 31.9*  < > 32.5* 34.2* 30.7*  MCV 92.6 91.2 89.4  < > 86.4 86.6 84.8  PLT 253 247 236  < > 303 277 233  < > = values in this interval not displayed. 08/04/13 wbc 20.6, hb 8.5, hct 25.9, plt 636, granulocyte 85, na 131, k 4.8, co2 25, glu 192, bun 18, cr 1.04, ca 7.9, t.bil 1, alp 173, ast 65, alt 73, t.pro 6, alb 2.4  08/06/13 wbc 20.1, hb 8.9, hct 27.3, plt 745, na 133, k 4.9, glu 242, bun 26, cr 1.3, t.bil 0.8, alp 186, ast 65, alt 90, t.pro 5.9, alb 2.4, ca 8 08/11/13:  Wbc 16.2, h/h 8.6/26.9, plts 420; Na 134, K 4.9, BUN 25, cr 1.31, Ca 7.9  Assessment/Plan Oral thrush Continues with nystatin swish and spit and completed diflucan.  Has improved some, but is still on steroids so difficult to treat.  Cont nystatin and encourage soft foods and drinks.  Diabetes mellitus without complication Poorly controlled as of late.  hba1c was good early in the month. Lab Results  Component Value Date    HGBA1C 7.1* 07/25/2013  will resume low dose lantus 5 units at hs and cont novolog 5 units ac meals for cbgs over 200.  Cont statin and arb   Subacute delirium High risk for this with prior stroke. He is on steroids, had  hyponatremia, mild elevation of his creatinine from poor po intake in context of thrush, is hyperglycemic, on abx including a quinolone, in snf environment, having some abdominal pain, had recent surgery-- all of which likely contribute. Encourage wife to be present and staff to keep him oriented as best we can.  Also encourage proper sleep/wake cycles and avoid sedative/hypnotics/benzos that will worsen his mental status even more.  Debility Not recovering well.  Await CT abd/pelvis to r/o abscess as etiology of poor recovery after chole.  He's had a prior stroke also.  Very weak and has difficulty participating in therapy and needs full adl assist at this time.  Hyperglycemia Has improved slightly to 16 from 20 with cipro and flagyl plus fluids.  Multifactorial as well.  Prednisone, possible abdominal infection/abscess, stress response.  Await CT results.  Leukocytosis, unspecified Most likely from the steroids, but can also be an underlying infection in view of his poor recovery.  Await CT.   Family/ staff Communication: His wife and the unit supervisor were present for the visit  Goals of care: here for short term rehab with goal to return home; however, prognosis poor at this time based on his diminished po intake and poor functional status  Labs/tests ordered:  Insulin adjusted, Keep plan for CT abd/pelvis and await results to determine further plans

## 2013-08-14 ENCOUNTER — Emergency Department (HOSPITAL_COMMUNITY): Payer: Medicare Other

## 2013-08-14 ENCOUNTER — Other Ambulatory Visit: Payer: Medicare Other

## 2013-08-14 ENCOUNTER — Inpatient Hospital Stay (HOSPITAL_COMMUNITY)
Admission: EM | Admit: 2013-08-14 | Discharge: 2013-09-06 | DRG: 393 | Disposition: A | Discharge status: Skilled Nursing Facility | Payer: Medicare Other | Attending: Internal Medicine | Admitting: Internal Medicine

## 2013-08-14 ENCOUNTER — Encounter: Payer: Self-pay | Admitting: Internal Medicine

## 2013-08-14 ENCOUNTER — Non-Acute Institutional Stay (SKILLED_NURSING_FACILITY): Payer: Medicare Other | Admitting: Internal Medicine

## 2013-08-14 ENCOUNTER — Encounter (HOSPITAL_COMMUNITY): Payer: Self-pay | Admitting: Emergency Medicine

## 2013-08-14 DIAGNOSIS — D638 Anemia in other chronic diseases classified elsewhere: Secondary | ICD-10-CM | POA: Diagnosis present

## 2013-08-14 DIAGNOSIS — R6520 Severe sepsis without septic shock: Secondary | ICD-10-CM

## 2013-08-14 DIAGNOSIS — A498 Other bacterial infections of unspecified site: Secondary | ICD-10-CM

## 2013-08-14 DIAGNOSIS — K651 Peritoneal abscess: Secondary | ICD-10-CM | POA: Diagnosis present

## 2013-08-14 DIAGNOSIS — N400 Enlarged prostate without lower urinary tract symptoms: Secondary | ICD-10-CM | POA: Diagnosis present

## 2013-08-14 DIAGNOSIS — R339 Retention of urine, unspecified: Secondary | ICD-10-CM | POA: Diagnosis present

## 2013-08-14 DIAGNOSIS — K929 Disease of digestive system, unspecified: Principal | ICD-10-CM | POA: Diagnosis present

## 2013-08-14 DIAGNOSIS — J90 Pleural effusion, not elsewhere classified: Secondary | ICD-10-CM | POA: Diagnosis present

## 2013-08-14 DIAGNOSIS — G9340 Encephalopathy, unspecified: Secondary | ICD-10-CM

## 2013-08-14 DIAGNOSIS — L89609 Pressure ulcer of unspecified heel, unspecified stage: Secondary | ICD-10-CM | POA: Diagnosis present

## 2013-08-14 DIAGNOSIS — F05 Delirium due to known physiological condition: Secondary | ICD-10-CM

## 2013-08-14 DIAGNOSIS — R410 Disorientation, unspecified: Secondary | ICD-10-CM

## 2013-08-14 DIAGNOSIS — K560 Paralytic ileus: Secondary | ICD-10-CM | POA: Diagnosis present

## 2013-08-14 DIAGNOSIS — J9601 Acute respiratory failure with hypoxia: Secondary | ICD-10-CM

## 2013-08-14 DIAGNOSIS — R52 Pain, unspecified: Secondary | ICD-10-CM | POA: Diagnosis present

## 2013-08-14 DIAGNOSIS — G8310 Monoplegia of lower limb affecting unspecified side: Secondary | ICD-10-CM

## 2013-08-14 DIAGNOSIS — E1142 Type 2 diabetes mellitus with diabetic polyneuropathy: Secondary | ICD-10-CM | POA: Diagnosis present

## 2013-08-14 DIAGNOSIS — E1149 Type 2 diabetes mellitus with other diabetic neurological complication: Secondary | ICD-10-CM | POA: Diagnosis present

## 2013-08-14 DIAGNOSIS — Z794 Long term (current) use of insulin: Secondary | ICD-10-CM

## 2013-08-14 DIAGNOSIS — IMO0002 Reserved for concepts with insufficient information to code with codable children: Secondary | ICD-10-CM

## 2013-08-14 DIAGNOSIS — R64 Cachexia: Secondary | ICD-10-CM | POA: Diagnosis present

## 2013-08-14 DIAGNOSIS — D72829 Elevated white blood cell count, unspecified: Secondary | ICD-10-CM | POA: Insufficient documentation

## 2013-08-14 DIAGNOSIS — K810 Acute cholecystitis: Secondary | ICD-10-CM | POA: Diagnosis present

## 2013-08-14 DIAGNOSIS — R05 Cough: Secondary | ICD-10-CM | POA: Clinically undetermined

## 2013-08-14 DIAGNOSIS — R509 Fever, unspecified: Secondary | ICD-10-CM | POA: Diagnosis present

## 2013-08-14 DIAGNOSIS — I6350 Cerebral infarction due to unspecified occlusion or stenosis of unspecified cerebral artery: Secondary | ICD-10-CM | POA: Diagnosis present

## 2013-08-14 DIAGNOSIS — Z961 Presence of intraocular lens: Secondary | ICD-10-CM

## 2013-08-14 DIAGNOSIS — R1310 Dysphagia, unspecified: Secondary | ICD-10-CM

## 2013-08-14 DIAGNOSIS — B3781 Candidal esophagitis: Secondary | ICD-10-CM | POA: Diagnosis present

## 2013-08-14 DIAGNOSIS — R188 Other ascites: Secondary | ICD-10-CM | POA: Diagnosis present

## 2013-08-14 DIAGNOSIS — Z9049 Acquired absence of other specified parts of digestive tract: Secondary | ICD-10-CM

## 2013-08-14 DIAGNOSIS — Z833 Family history of diabetes mellitus: Secondary | ICD-10-CM

## 2013-08-14 DIAGNOSIS — B370 Candidal stomatitis: Secondary | ICD-10-CM

## 2013-08-14 DIAGNOSIS — T8140XA Infection following a procedure, unspecified, initial encounter: Secondary | ICD-10-CM

## 2013-08-14 DIAGNOSIS — B952 Enterococcus as the cause of diseases classified elsewhere: Secondary | ICD-10-CM | POA: Diagnosis present

## 2013-08-14 DIAGNOSIS — L0291 Cutaneous abscess, unspecified: Secondary | ICD-10-CM | POA: Diagnosis present

## 2013-08-14 DIAGNOSIS — A419 Sepsis, unspecified organism: Secondary | ICD-10-CM | POA: Diagnosis present

## 2013-08-14 DIAGNOSIS — K838 Other specified diseases of biliary tract: Secondary | ICD-10-CM | POA: Diagnosis present

## 2013-08-14 DIAGNOSIS — E785 Hyperlipidemia, unspecified: Secondary | ICD-10-CM | POA: Diagnosis present

## 2013-08-14 DIAGNOSIS — R059 Cough, unspecified: Secondary | ICD-10-CM

## 2013-08-14 DIAGNOSIS — I69998 Other sequelae following unspecified cerebrovascular disease: Secondary | ICD-10-CM

## 2013-08-14 DIAGNOSIS — B91 Sequelae of poliomyelitis: Secondary | ICD-10-CM

## 2013-08-14 DIAGNOSIS — I498 Other specified cardiac arrhythmias: Secondary | ICD-10-CM | POA: Diagnosis not present

## 2013-08-14 DIAGNOSIS — J9600 Acute respiratory failure, unspecified whether with hypoxia or hypercapnia: Secondary | ICD-10-CM

## 2013-08-14 DIAGNOSIS — R000 Tachycardia, unspecified: Secondary | ICD-10-CM

## 2013-08-14 DIAGNOSIS — N390 Urinary tract infection, site not specified: Secondary | ICD-10-CM

## 2013-08-14 DIAGNOSIS — R1011 Right upper quadrant pain: Secondary | ICD-10-CM

## 2013-08-14 DIAGNOSIS — L0390 Cellulitis, unspecified: Secondary | ICD-10-CM | POA: Diagnosis present

## 2013-08-14 DIAGNOSIS — R7881 Bacteremia: Secondary | ICD-10-CM

## 2013-08-14 DIAGNOSIS — Z9849 Cataract extraction status, unspecified eye: Secondary | ICD-10-CM

## 2013-08-14 DIAGNOSIS — K8010 Calculus of gallbladder with chronic cholecystitis without obstruction: Secondary | ICD-10-CM

## 2013-08-14 DIAGNOSIS — D649 Anemia, unspecified: Secondary | ICD-10-CM | POA: Diagnosis present

## 2013-08-14 DIAGNOSIS — M1990 Unspecified osteoarthritis, unspecified site: Secondary | ICD-10-CM

## 2013-08-14 DIAGNOSIS — I639 Cerebral infarction, unspecified: Secondary | ICD-10-CM | POA: Diagnosis present

## 2013-08-14 DIAGNOSIS — R51 Headache: Secondary | ICD-10-CM

## 2013-08-14 DIAGNOSIS — R531 Weakness: Secondary | ICD-10-CM

## 2013-08-14 DIAGNOSIS — E119 Type 2 diabetes mellitus without complications: Secondary | ICD-10-CM | POA: Diagnosis present

## 2013-08-14 DIAGNOSIS — R5381 Other malaise: Secondary | ICD-10-CM | POA: Insufficient documentation

## 2013-08-14 DIAGNOSIS — Y836 Removal of other organ (partial) (total) as the cause of abnormal reaction of the patient, or of later complication, without mention of misadventure at the time of the procedure: Secondary | ICD-10-CM | POA: Diagnosis present

## 2013-08-14 DIAGNOSIS — R739 Hyperglycemia, unspecified: Secondary | ICD-10-CM | POA: Insufficient documentation

## 2013-08-14 DIAGNOSIS — I10 Essential (primary) hypertension: Secondary | ICD-10-CM | POA: Diagnosis present

## 2013-08-14 DIAGNOSIS — K750 Abscess of liver: Secondary | ICD-10-CM | POA: Diagnosis present

## 2013-08-14 DIAGNOSIS — T8149XA Infection following a procedure, other surgical site, initial encounter: Secondary | ICD-10-CM

## 2013-08-14 DIAGNOSIS — L8995 Pressure ulcer of unspecified site, unstageable: Secondary | ICD-10-CM | POA: Diagnosis present

## 2013-08-14 DIAGNOSIS — Z8249 Family history of ischemic heart disease and other diseases of the circulatory system: Secondary | ICD-10-CM

## 2013-08-14 DIAGNOSIS — E46 Unspecified protein-calorie malnutrition: Secondary | ICD-10-CM | POA: Diagnosis present

## 2013-08-14 DIAGNOSIS — K5900 Constipation, unspecified: Secondary | ICD-10-CM

## 2013-08-14 LAB — CBC WITH DIFFERENTIAL/PLATELET
BASOS ABS: 0 10*3/uL (ref 0.0–0.1)
BASOS PCT: 0 % (ref 0–1)
EOS ABS: 0 10*3/uL (ref 0.0–0.7)
EOS PCT: 0 % (ref 0–5)
HEMATOCRIT: 30 % — AB (ref 39.0–52.0)
Hemoglobin: 9.8 g/dL — ABNORMAL LOW (ref 13.0–17.0)
LYMPHS PCT: 2 % — AB (ref 12–46)
Lymphs Abs: 0.4 10*3/uL — ABNORMAL LOW (ref 0.7–4.0)
MCH: 26.7 pg (ref 26.0–34.0)
MCHC: 32.7 g/dL (ref 30.0–36.0)
MCV: 81.7 fL (ref 78.0–100.0)
MONO ABS: 0.2 10*3/uL (ref 0.1–1.0)
Monocytes Relative: 1 % — ABNORMAL LOW (ref 3–12)
Neutro Abs: 20.4 10*3/uL — ABNORMAL HIGH (ref 1.7–7.7)
Neutrophils Relative %: 97 % — ABNORMAL HIGH (ref 43–77)
PLATELETS: 311 10*3/uL (ref 150–400)
RBC: 3.67 MIL/uL — ABNORMAL LOW (ref 4.22–5.81)
RDW: 17.1 % — ABNORMAL HIGH (ref 11.5–15.5)
WBC: 20.9 10*3/uL — ABNORMAL HIGH (ref 4.0–10.5)

## 2013-08-14 LAB — URINALYSIS, ROUTINE W REFLEX MICROSCOPIC
BILIRUBIN URINE: NEGATIVE
Glucose, UA: 500 mg/dL — AB
KETONES UR: 15 mg/dL — AB
Leukocytes, UA: NEGATIVE
NITRITE: NEGATIVE
PH: 5 (ref 5.0–8.0)
Protein, ur: NEGATIVE mg/dL
Specific Gravity, Urine: 1.015 (ref 1.005–1.030)
Urobilinogen, UA: 0.2 mg/dL (ref 0.0–1.0)

## 2013-08-14 LAB — URINE MICROSCOPIC-ADD ON

## 2013-08-14 LAB — COMPREHENSIVE METABOLIC PANEL
ALBUMIN: 2.1 g/dL — AB (ref 3.5–5.2)
ALK PHOS: 281 U/L — AB (ref 39–117)
ALT: 45 U/L (ref 0–53)
AST: 60 U/L — ABNORMAL HIGH (ref 0–37)
BILIRUBIN TOTAL: 0.6 mg/dL (ref 0.3–1.2)
BUN: 31 mg/dL — ABNORMAL HIGH (ref 6–23)
CHLORIDE: 97 meq/L (ref 96–112)
CO2: 23 mEq/L (ref 19–32)
Calcium: 8.6 mg/dL (ref 8.4–10.5)
Creatinine, Ser: 1.06 mg/dL (ref 0.50–1.35)
GFR calc non Af Amer: 67 mL/min — ABNORMAL LOW (ref >90)
GFR, EST AFRICAN AMERICAN: 77 mL/min — AB (ref >90)
GLUCOSE: 290 mg/dL — AB (ref 70–99)
POTASSIUM: 5.2 meq/L (ref 3.7–5.3)
Sodium: 136 mEq/L — ABNORMAL LOW (ref 137–147)
Total Protein: 7.5 g/dL (ref 6.0–8.3)

## 2013-08-14 LAB — TROPONIN I

## 2013-08-14 LAB — I-STAT CG4 LACTIC ACID, ED: Lactic Acid, Venous: 2.39 mmol/L — ABNORMAL HIGH (ref 0.5–2.2)

## 2013-08-14 LAB — MAGNESIUM: Magnesium: 2.1 mg/dL (ref 1.5–2.5)

## 2013-08-14 LAB — PHOSPHORUS: Phosphorus: 3.1 mg/dL (ref 2.3–4.6)

## 2013-08-14 MED ORDER — NYSTATIN 100000 UNIT/ML MT SUSP
5.0000 mL | Freq: Two times a day (BID) | OROMUCOSAL | Status: DC
  Administered 2013-08-14 – 2013-08-16 (×4): 500000 [IU] via ORAL
  Filled 2013-08-14 (×9): qty 5

## 2013-08-14 MED ORDER — SODIUM CHLORIDE 0.9 % IV BOLUS (SEPSIS)
1000.0000 mL | Freq: Once | INTRAVENOUS | Status: AC
  Administered 2013-08-14: 1000 mL via INTRAVENOUS

## 2013-08-14 MED ORDER — VANCOMYCIN HCL 10 G IV SOLR
1250.0000 mg | INTRAVENOUS | Status: DC
  Administered 2013-08-15: 1250 mg via INTRAVENOUS
  Filled 2013-08-14 (×2): qty 1250

## 2013-08-14 MED ORDER — GABAPENTIN 800 MG PO TABS
800.0000 mg | ORAL_TABLET | Freq: Three times a day (TID) | ORAL | Status: DC | PRN
  Filled 2013-08-14: qty 1

## 2013-08-14 MED ORDER — ONDANSETRON HCL 4 MG/2ML IJ SOLN
4.0000 mg | Freq: Four times a day (QID) | INTRAMUSCULAR | Status: DC | PRN
  Administered 2013-08-14: 4 mg via INTRAVENOUS
  Filled 2013-08-14: qty 2

## 2013-08-14 MED ORDER — POLYETHYLENE GLYCOL 3350 17 G PO PACK
17.0000 g | PACK | Freq: Three times a day (TID) | ORAL | Status: DC
  Administered 2013-08-16 – 2013-08-30 (×31): 17 g via ORAL
  Filled 2013-08-14 (×52): qty 1

## 2013-08-14 MED ORDER — SODIUM CHLORIDE 0.9 % IV SOLN
INTRAVENOUS | Status: AC
  Administered 2013-08-14: 21:00:00 via INTRAVENOUS

## 2013-08-14 MED ORDER — PIPERACILLIN-TAZOBACTAM 3.375 G IVPB 30 MIN
3.3750 g | Freq: Once | INTRAVENOUS | Status: AC
  Administered 2013-08-14: 3.375 g via INTRAVENOUS
  Filled 2013-08-14: qty 50

## 2013-08-14 MED ORDER — IOHEXOL 300 MG/ML  SOLN
100.0000 mL | Freq: Once | INTRAMUSCULAR | Status: AC | PRN
  Administered 2013-08-14: 100 mL via INTRAVENOUS

## 2013-08-14 MED ORDER — ENOXAPARIN SODIUM 40 MG/0.4ML ~~LOC~~ SOLN
40.0000 mg | SUBCUTANEOUS | Status: DC
  Administered 2013-08-14 – 2013-08-27 (×14): 40 mg via SUBCUTANEOUS
  Filled 2013-08-14 (×18): qty 0.4

## 2013-08-14 MED ORDER — ACETAMINOPHEN 325 MG PO TABS
650.0000 mg | ORAL_TABLET | Freq: Four times a day (QID) | ORAL | Status: DC | PRN
  Administered 2013-08-14: 650 mg via ORAL
  Filled 2013-08-14: qty 2

## 2013-08-14 MED ORDER — HYDROMORPHONE HCL PF 1 MG/ML IJ SOLN
1.0000 mg | INTRAMUSCULAR | Status: DC | PRN
  Administered 2013-08-14 – 2013-08-25 (×15): 1 mg via INTRAVENOUS
  Filled 2013-08-14 (×15): qty 1

## 2013-08-14 MED ORDER — ONDANSETRON HCL 4 MG PO TABS: 4.0000 mg | ORAL_TABLET | Freq: Four times a day (QID) | ORAL | Status: DC | PRN

## 2013-08-14 MED ORDER — IOHEXOL 300 MG/ML  SOLN: 25.0000 mL | INTRAMUSCULAR | Status: DC

## 2013-08-14 MED ORDER — INSULIN ASPART 100 UNIT/ML ~~LOC~~ SOLN
0.0000 [IU] | Freq: Three times a day (TID) | SUBCUTANEOUS | Status: DC
  Administered 2013-08-15 (×2): 5 [IU] via SUBCUTANEOUS
  Administered 2013-08-16 (×2): 2 [IU] via SUBCUTANEOUS
  Administered 2013-08-17: 3 [IU] via SUBCUTANEOUS
  Administered 2013-08-17 – 2013-08-18 (×2): 2 [IU] via SUBCUTANEOUS
  Administered 2013-08-19: 3 [IU] via SUBCUTANEOUS
  Administered 2013-08-20: 2 [IU] via SUBCUTANEOUS
  Administered 2013-08-20: 3 [IU] via SUBCUTANEOUS
  Administered 2013-08-21: 13:00:00 via SUBCUTANEOUS
  Administered 2013-08-21 – 2013-08-22 (×2): 3 [IU] via SUBCUTANEOUS
  Administered 2013-08-22: 2 [IU] via SUBCUTANEOUS
  Administered 2013-08-23: 5 [IU] via SUBCUTANEOUS
  Administered 2013-08-23: 2 [IU] via SUBCUTANEOUS
  Administered 2013-08-23 – 2013-08-24 (×4): 3 [IU] via SUBCUTANEOUS
  Administered 2013-08-25 (×2): 8 [IU] via SUBCUTANEOUS
  Administered 2013-08-26: 5 [IU] via SUBCUTANEOUS
  Administered 2013-08-26: 2 [IU] via SUBCUTANEOUS
  Administered 2013-08-26: 8 [IU] via SUBCUTANEOUS
  Administered 2013-08-27 (×2): 5 [IU] via SUBCUTANEOUS

## 2013-08-14 MED ORDER — DOCUSATE SODIUM 100 MG PO CAPS
100.0000 mg | ORAL_CAPSULE | Freq: Two times a day (BID) | ORAL | Status: DC
  Administered 2013-08-16 – 2013-08-27 (×19): 100 mg via ORAL
  Filled 2013-08-14 (×17): qty 1

## 2013-08-14 MED ORDER — ALBUTEROL SULFATE (2.5 MG/3ML) 0.083% IN NEBU: 2.5000 mg | INHALATION_SOLUTION | RESPIRATORY_TRACT | Status: DC | PRN

## 2013-08-14 MED ORDER — PREDNISONE 5 MG PO TABS
5.0000 mg | ORAL_TABLET | Freq: Every day | ORAL | Status: DC
  Administered 2013-08-16 – 2013-09-06 (×22): 5 mg via ORAL
  Filled 2013-08-14 (×29): qty 1

## 2013-08-14 MED ORDER — TAMSULOSIN HCL 0.4 MG PO CAPS
0.4000 mg | ORAL_CAPSULE | Freq: Two times a day (BID) | ORAL | Status: DC
  Administered 2013-08-16 – 2013-09-06 (×43): 0.4 mg via ORAL
  Filled 2013-08-14 (×53): qty 1

## 2013-08-14 MED ORDER — SODIUM CHLORIDE 0.9 % IV SOLN
Freq: Once | INTRAVENOUS | Status: AC
  Administered 2013-08-14: 16:00:00 via INTRAVENOUS

## 2013-08-14 MED ORDER — VANCOMYCIN HCL IN DEXTROSE 1-5 GM/200ML-% IV SOLN
1000.0000 mg | Freq: Once | INTRAVENOUS | Status: AC
  Administered 2013-08-14: 1000 mg via INTRAVENOUS
  Filled 2013-08-14: qty 200

## 2013-08-14 MED ORDER — INSULIN ASPART 100 UNIT/ML ~~LOC~~ SOLN
0.0000 [IU] | Freq: Every day | SUBCUTANEOUS | Status: DC
  Administered 2013-08-14 – 2013-08-31 (×5): 3 [IU] via SUBCUTANEOUS
  Administered 2013-09-01 – 2013-09-04 (×3): 2 [IU] via SUBCUTANEOUS

## 2013-08-14 MED ORDER — ONDANSETRON HCL 4 MG/2ML IJ SOLN
4.0000 mg | Freq: Once | INTRAMUSCULAR | Status: AC
  Administered 2013-08-14: 4 mg via INTRAVENOUS
  Filled 2013-08-14: qty 2

## 2013-08-14 MED ORDER — MORPHINE SULFATE 4 MG/ML IJ SOLN
4.0000 mg | Freq: Once | INTRAMUSCULAR | Status: AC
  Administered 2013-08-14: 4 mg via INTRAVENOUS
  Filled 2013-08-14: qty 1

## 2013-08-14 MED ORDER — PIPERACILLIN-TAZOBACTAM 3.375 G IVPB
3.3750 g | Freq: Three times a day (TID) | INTRAVENOUS | Status: DC
  Administered 2013-08-15 – 2013-08-18 (×11): 3.375 g via INTRAVENOUS
  Filled 2013-08-14 (×12): qty 50

## 2013-08-14 MED ORDER — GABAPENTIN 400 MG PO CAPS
800.0000 mg | ORAL_CAPSULE | Freq: Three times a day (TID) | ORAL | Status: DC | PRN
  Administered 2013-08-29: 800 mg via ORAL
  Filled 2013-08-14 (×2): qty 2

## 2013-08-14 MED ORDER — LATANOPROST 0.005 % OP SOLN
1.0000 [drp] | Freq: Every day | OPHTHALMIC | Status: DC
  Administered 2013-08-14 – 2013-09-05 (×23): 1 [drp] via OPHTHALMIC
  Filled 2013-08-14 (×2): qty 2.5

## 2013-08-14 MED ORDER — IRBESARTAN 300 MG PO TABS
300.0000 mg | ORAL_TABLET | Freq: Every day | ORAL | Status: DC
  Filled 2013-08-14 (×2): qty 1

## 2013-08-14 NOTE — Progress Notes (Signed)
ANTIBIOTIC CONSULT NOTE - INITIAL  Pharmacy Consult for vanc/zosyn Indication: Intra-abd infection  No Known Allergies  Patient Measurements: Wt = 65 kg Adjusted Body Weight:   Vital Signs: Temp: 99.7 F (37.6 C) (02/26 1605) Temp src: Oral (02/26 1605) BP: 110/58 mmHg (02/26 1830) Pulse Rate: 96 (02/26 1830) Intake/Output from previous day:   Intake/Output from this shift:    Labs:  Recent Labs  08/14/13 1345  WBC 20.9*  HGB 9.8*  PLT 311  CREATININE 1.06   The CrCl is unknown because both a height and weight (above a minimum accepted value) are required for this calculation. No results found for this basename: VANCOTROUGH, Corlis Leak, VANCORANDOM, GENTTROUGH, GENTPEAK, GENTRANDOM, TOBRATROUGH, TOBRAPEAK, TOBRARND, AMIKACINPEAK, AMIKACINTROU, AMIKACIN,  in the last 72 hours   Microbiology: Recent Results (from the past 720 hour(s))  SURGICAL PCR SCREEN     Status: None   Collection Time    07/24/13  3:58 PM      Result Value Ref Range Status   MRSA, PCR NEGATIVE  NEGATIVE Final   Staphylococcus aureus NEGATIVE  NEGATIVE Final   Comment:            The Xpert SA Assay (FDA     approved for NASAL specimens     in patients over 13 years of age),     is one component of     a comprehensive surveillance     program.  Test performance has     been validated by [employer] for patients greater     than or equal to 25 year old.     It is not intended     to diagnose infection nor to     guide or monitor treatment.  URINE CULTURE     Status: None   Collection Time    07/28/13  3:22 PM      Result Value Ref Range Status   Specimen Description URINE, CLEAN CATCH   Final   Special Requests NONE   Final   Culture  Setup Time     Final   Value: 07/28/2013 20:55     Performed at [facility]     Final   Value: NO GROWTH     Performed at [facility]   Culture     Final   Value: NO GROWTH     Performed at [facility]   Report Status 07/29/2013 FINAL   Final    Medical History: Past Medical History  Diagnosis Date  . Hypertension   . Small bowel obstruction   . BPH (benign prostatic hyperplasia)   . Neuropathy   . Polio   . Polio Childhood  . Constipation 05/13/2013  . Anemia 05/13/2013  . Type II diabetes mellitus   . Exertional shortness of breath     "sometimes" (07/25/2013)  . History of stomach ulcers   . Stroke 2014    residual:  "left hand kind of numb" (07/25/2013)  . Arthritis     "right leg" (07/25/2013)    Medications:   (Not in a hospital admission) Scheduled:   Infusions:   Assessment: 78 yo who is from a nursing home. He recently had cholecystectomy. Now presenting with intra-abd abscess. Starting vanc/zosyn empirically tonight.   Goal of Therapy:  Vancomycin trough level 15-20 mcg/ml  Plan:   Vanc 1.25g IV q24 F/u with level at steady state as needed Zosyn 3.375g IV q8

## 2013-08-14 NOTE — Consult Note (Signed)
Reason for Consult:Hepatic and perihepatic abscess status post arthroscopic cholecystectomy Referring Physician: Trish Mage  Kastiel Mike Mack is an 78 y.o. male.  HPI: Patient underwent laparoscopic cholecystectomy by [name] on 07/25/2013. He was hospitalized postoperatively until 07/30/2013. He has multiple medical problems including previous cerebrovascular accident, diabetes mellitus, hypertension, and history of polio.He was discharged to skilled nursing on 07/30/2013. [name] saw him back in the office on 08/08/2013. He reported concern regarding possible intra-abdominal abscess. It appears a CT was ordered but I do not see a result but it was performed. The patient presented to the emergency department today with 3 days history of decreased appetite. Today he developed fever and chills, and abdominal pain. In the emergency department he was found to be febrile with a leukocytosis. CT scan of the abdomen and pelvis demonstrates an abscess in the gallbladder bed, hepatic abscess, and an additional fluid collection in the abdomen. I was asked to see him in consultation by Dr. Stark Jock. The patient's wife and son assist in the history as he is a poor historian.  Past Medical History  Diagnosis Date  . Hypertension   . Small bowel obstruction   . BPH (benign prostatic hyperplasia)   . Neuropathy   . Polio   . Polio Childhood  . Constipation 05/13/2013  . Anemia 05/13/2013  . Type II diabetes mellitus   . Exertional shortness of breath     "sometimes" (07/25/2013)  . History of stomach ulcers   . Stroke 2014    residual:  "left hand kind of numb" (07/25/2013)  . Arthritis     "right leg" (07/25/2013)    Past Surgical History  Procedure Laterality Date  . Bowel resection    . Cataract extraction w/ intraocular lens  implant, bilateral Bilateral 2000's  . Colonoscopy      Hx: of  . Laparoscopic cholecystectomy  07/25/2013    w/LOA (07/25/2013)  . Excisional hemorrhoidectomy  2000's  . Colon  surgery    . Inguinal hernia repair Right 1970's  . Cholecystectomy N/A 07/25/2013    Procedure: LAPAROSCOPIC CHOLECYSTECTOMY WITH INTRAOPERATIVE CHOLANGIOGRAM;  Surgeon: [name], MD;  Location: [facility];  Service: General;  Laterality: N/A;    Family History  Problem Relation Age of Onset  . Diabetes Sister   . Diabetes Sister   . Diabetes Brother   . Hypertension Sister   . Hypertension Sister   . Hypertension Brother   . Heart failure Mother   . Heart attack Sister     Social History:  reports that he has never smoked. He has never used smokeless tobacco. He reports that he does not drink alcohol or use illicit drugs.  Allergies: No Known Allergies  Medications: Prior to Admission:  (Not in a hospital admission) Scheduled:  Continuous:  CBJ:[id number]  Results for orders placed during the hospital encounter of 08/14/13 (from the past 48 hour(s))  CBC WITH DIFFERENTIAL     Status: Abnormal   Collection Time    08/14/13  1:45 PM      Result Value Ref Range   WBC 20.9 (*) 4.0 - 10.5 K/uL   RBC 3.67 (*) 4.22 - 5.81 MIL/uL   Hemoglobin 9.8 (*) 13.0 - 17.0 g/dL   HCT 30.0 (*) 39.0 - 52.0 %   MCV 81.7  78.0 - 100.0 fL   MCH 26.7  26.0 - 34.0 pg   MCHC 32.7  30.0 - 36.0 g/dL   RDW 17.1 (*) 11.5 - 15.5 %  Platelets 311  150 - 400 K/uL   Neutrophils Relative % 97 (*) 43 - 77 %   Neutro Abs 20.4 (*) 1.7 - 7.7 K/uL   Lymphocytes Relative 2 (*) 12 - 46 %   Lymphs Abs 0.4 (*) 0.7 - 4.0 K/uL   Monocytes Relative 1 (*) 3 - 12 %   Monocytes Absolute 0.2  0.1 - 1.0 K/uL   Eosinophils Relative 0  0 - 5 %   Eosinophils Absolute 0.0  0.0 - 0.7 K/uL   Basophils Relative 0  0 - 1 %   Basophils Absolute 0.0  0.0 - 0.1 K/uL  COMPREHENSIVE METABOLIC PANEL     Status: Abnormal   Collection Time    08/14/13  1:45 PM      Result Value Ref Range   Sodium 136 (*) 137 - 147 mEq/L   Potassium 5.2  3.7 - 5.3 mEq/L   Chloride 97  96 - 112 mEq/L   CO2 23  19 - 32 mEq/L   Glucose,  Bld 290 (*) 70 - 99 mg/dL   BUN 31 (*) 6 - 23 mg/dL   Creatinine, Ser 1.06  0.50 - 1.35 mg/dL   Calcium 8.6  8.4 - 10.5 mg/dL   Total Protein 7.5  6.0 - 8.3 g/dL   Albumin 2.1 (*) 3.5 - 5.2 g/dL   AST 60 (*) 0 - 37 U/L   ALT 45  0 - 53 U/L   Alkaline Phosphatase 281 (*) 39 - 117 U/L   Total Bilirubin 0.6  0.3 - 1.2 mg/dL   GFR calc non Af Amer 67 (*) >90 mL/min   GFR calc Af Amer 77 (*) >90 mL/min   Comment: (NOTE)     The eGFR has been calculated using the CKD EPI equation.     This calculation has not been validated in all clinical situations.     eGFR's persistently <90 mL/min signify possible Chronic Kidney     Disease.  TROPONIN I     Status: None   Collection Time    08/14/13  1:45 PM      Result Value Ref Range   Troponin I <0.30  <0.30 ng/mL   Comment:            Due to the release kinetics of cTnI,     a negative result within the first hours     of the onset of symptoms does not rule out     myocardial infarction with certainty.     If myocardial infarction is still suspected,     repeat the test at appropriate intervals.  I-STAT CG4 LACTIC ACID, ED     Status: Abnormal   Collection Time    08/14/13  2:09 PM      Result Value Ref Range   Lactic Acid, Venous 2.39 (*) 0.5 - 2.2 mmol/L    Dg Chest 2 View  08/14/2013   CLINICAL DATA:  Fever, postoperative state  EXAM: CHEST  2 VIEW  COMPARISON:  DG CHEST 2 VIEW dated 07/28/2013  FINDINGS: There is a moderate-sized pleural effusion layering posteriorly on the right. A discrete alveolar infiltrate on the right is not demonstrated. The left lung is clear. The cardiac silhouette is normal in size. The pulmonary vascularity is not clearly engorged. The trachea is midline. The observed portions of the bony thorax exhibit no acute abnormality.  IMPRESSION: There is a moderate sized right-sided pleural effusion layering posteriorly.   Electronically Signed  By: David  Martinique   On: 08/14/2013 14:18   Ct Abdomen Pelvis W  Contrast  08/14/2013   CLINICAL DATA:  Fever, abdominal pain, status post lap cholecystectomy 1 week ago  EXAM: CT ABDOMEN AND PELVIS WITH CONTRAST  TECHNIQUE: Multidetector CT imaging of the abdomen and pelvis was performed using the standard protocol following bolus administration of intravenous contrast.  CONTRAST:  155m OMNIPAQUE IOHEXOL 300 MG/ML  SOLN  COMPARISON:  CT abdomen pelvis dated 05/13/2013  FINDINGS: Moderate right and trace left pleural effusions. Associated lower lobe atelectasis.  Status post cholecystectomy. Complex fluid collection/abscess arising from the surgical bed and extending along the medial segment left hepatic lobe to the anterior hepatic dome (series 2/image 11). Collection is multiloculated and difficult to discretely measure, but is approximately 7.4 x 6.5 x 9.4 cm (series 2/ image 22; coronal image 32). Foci of gas within the collection, some of which is nondependent. This appearance is compatible with a postoperative abscess or possibly an infected biloma.  Adjacent 3.1 x 2.5 x 3.6 cm fluid collection/abscess in the posterior segment right hepatic lobe (series 2/image 26). Additional bilobed fluid collection inferior to the left hepatic lobe and beneath the anterior abdominal wall, measuring approximately 3.0 x 3.5 x 3.2 cm along its right aspect (series 2/ image 23) and 4.3 x 4.8 x 6.8 cm along its left aspect (series 2/image 29; coronal image 15).  Liver is otherwise unremarkable.  Spleen, pancreas, and adrenal glands are within normal limits.  Bilateral renal cysts, measuring up to 2.1 cm in the posterior right upper pole (series 2/ image 30) and 2.2 cm in the medial left lower pole (series 2/image 38). No hydronephrosis.  No evidence of bowel obstruction.  Normal appendix.  No evidence of abdominal aortic aneurysm.  No abdominopelvic ascites.  No suspicious abdominopelvic lymphadenopathy.  Prostate is notable for enlargement of the central gland which indents the base of the  bladder.  Dystrophic calcifications just posterior to the right bladder (series 2/image 80). Bladder is otherwise unremarkable  IMPRESSION: Status post cholecystectomy.  9.4 cm multiloculated abscess/infected biloma arising from the surgical bed, extending along the medial segment left hepatic lobe, and overlying the left hepatic dome.  Adjacent 3.6 cm abscess in the posterior segment right hepatic lobe.  Adjacent bilobed abscess extending from the left hepatic lobe to beneath the anterior abdominal wall, measuring up to 6.5 cm in maximal dimension.  Foci of gas within the dominant collection.  No free air.  These results will be called to the ordering clinician or representative by the Radiologist Assistant, and communication documented in the PACS Dashboard.   Electronically Signed   By: SJulian HyM.D.   On: 08/14/2013 17:08    Review of Systems  Constitutional: Positive for fever and chills.  HENT: Negative.   Eyes: Negative.   Respiratory: Positive for cough and shortness of breath.   Cardiovascular: Positive for chest pain and palpitations.  Gastrointestinal: Positive for nausea, abdominal pain and constipation. Negative for vomiting and blood in stool.  Genitourinary: Negative.   Musculoskeletal:       History of polio  Skin:       Right heel blister  Neurological:       Chronic weakness due to polio  Endo/Heme/Allergies: Negative.   Psychiatric/Behavioral: Negative.    Blood pressure 110/58, pulse 96, temperature 99.7 F (37.6 C), temperature source Oral, resp. rate 17, SpO2 92.00%. Physical Exam  Constitutional: He appears well-developed. No distress.  HENT:  Head:  Normocephalic and atraumatic.  Mild erythema on tongue and oral mucous membranes  Eyes: Pupils are equal, round, and reactive to light. Right eye exhibits no discharge. Left eye exhibits no discharge. No scleral icterus.  Neck: Normal range of motion. No tracheal deviation present.  Cardiovascular: Normal rate,  regular rhythm and normal heart sounds.   No murmur heard. Respiratory: Effort normal and breath sounds normal. No stridor. No respiratory distress. He has no wheezes. He has no rales. He exhibits no tenderness.  GI: Soft. He exhibits distension. He exhibits no mass. There is tenderness. There is no rebound and no guarding.  Abdomen mildly distended, mild right upper quadrant tenderness without guarding, no generalized peritonitis, bowel sounds are hypoactive  Musculoskeletal:  Scar right knee, discoloration right heel possibly early decubitus  Neurological: He is alert. He exhibits normal muscle tone.  Answers some questions and follows commands, poor short-term memory and poor historian  Psychiatric: He has a normal mood and affect.    Assessment/Plan: Status post Laparoscopic cholecystectomy now with abscesses in the gallbladder fossa and hepatic abscess with additional abdominal fluid collection. Agree with and appreciate medical admission for management of difficult hyperglycemia and diabetes mellitus as well as other medical problems. Continue IV Vanco and Zosyn. I have ordered interventional radiology to percutaneously drain the abscesses in the morning. Additionally, we will checkHIDA scan to rule out ongoing bile leak. I discussed the plan in detail with his wife and son and answered their questions. We will follow closely.  Lindsay Soulliere E 08/14/2013, 6:43 PM

## 2013-08-14 NOTE — ED Notes (Signed)
MD at bedside. 

## 2013-08-14 NOTE — Assessment & Plan Note (Addendum)
Poorly controlled as of late.  hba1c was good early in the month. Lab Results  Component Value Date   HGBA1C 7.1* 07/25/2013  will resume low dose lantus 5 units at hs and cont novolog 5 units ac meals for cbgs over 200.  Cont statin and arb

## 2013-08-14 NOTE — ED Notes (Addendum)
Pt arrives via [facility] from [facility] where he is a resident currently. Pt reportedly with several day hx of fever. Pt is post-op for gall bladder removal on 2/7. Wife reports approx 10lb weight loss since surgery. Has been receiving IV antibiotics at [facility]. Pt with surgical wound to umbilicus. Reports right middle and lower abdominal pain. States last meal yesterday. Decreased po. Weakness. Abdomen distended. Pt also with blister to r foot.

## 2013-08-14 NOTE — H&P (Addendum)
[facility] History and Physical  Ryelan Kazee PFX:[id number] DOB: 1935/12/20 DOA: 08/14/2013  Referring physician: ED physician PCP: [name], MD   Chief Complaint: fevers, chills  HPI:  Pt is 78 yo male with history of diabetes, HTN, HLD, prior stroke, recent laparoscopic cholecystectomy 07/25/2013 by [name], and discharged to [facility] 07/30/2013, now presented to [facility] ED with main concern of several days duration of persistent epigastric abdominal pain, throbbing and 5/10 in severity, non radiating, associated with fevers, chills, malaise, poor oral intake, no specific aggravating or alleviating factors. No similar events in the past. Please note that pt is poor historian and family at bedside helped with providing history. There has been reported exertional shortness of breath and chest tightness but details not available. Pt currently denies chest pain or shortness of breath.   In ED, CT abdomen notable for abscess in the gallbladder bed, hepatic abscess, and an additional fluid collection in the abdomen. Surgery consulted and [facility] asked to admit for further evaluation. Pt started on Vancomycin and Zosyn in ED.  Assessment and Plan: Active Problems: Multiloculated abscess in the gallbladder bed  - admit to SDU, appreciate surgery's assistance - will continue Vancomycin and Zosyn as already started on ED - will follow up on blood culture - IVF started and provide supportive care with analgesia and antiemetics as needed  - IR consult requested by surgery team for percutaneous drain of the abscess, plan to perform in AM - HIDA scan also ordered to rule out ongoing bile leak Leukocytosis - secondary to the abscesses as noted above - ABX Vancomycin and ZOsyn - follow up on blood cultures - CBC in AM Anemia of chronic disease - baseline Hg 10 -11 - no signs of active bleeding - repeat CBC in AM Transaminitis - likely secondary to principal problem - repeat CMET in  AM Diabetes mellitus  - check A1C - place on SSI - hold Metformin  Shortness of breath - this is possibly related to moderate sized pleural effusion - monitor closely - very gentle hydration with NS for 8 hours  HTN - continue Avapro   Radiological Exams on Admission:  Dg Chest 2 View   08/14/2013   There is a moderate sized right-sided pleural effusion layering posteriorly.   Ct Abdomen Pelvis W Contrast   08/14/2013   Status post cholecystectomy.  9.4 cm multiloculated abscess/infected biloma arising from the surgical bed, extending along the medial segment left hepatic lobe, and overlying the left hepatic dome.  Adjacent 3.6 cm abscess in the posterior segment right hepatic lobe.  Adjacent bilobed abscess extending from the left hepatic lobe to beneath the anterior abdominal wall, measuring up to 6.5 cm in maximal dimension.  Foci of gas within the dominant collection.  No free air.  Code Status: Full Family Communication: Family at bedside  Disposition Plan: Admit to SDU   Review of Systems:  Constitutional: Negative for diaphoresis.  HENT: Negative for hearing loss, ear pain, nosebleeds, congestion, sore throat, neck pain, tinnitus and ear discharge.   Eyes: Negative for blurred vision, double vision, photophobia, pain, discharge and redness.  Respiratory: Negative for wheezing and stridor.   Cardiovascular: Negative for orthopnea, claudication and leg swelling.  Gastrointestinal: Negative for heartburn, constipation, blood in stool and melena.  Genitourinary: Negative for dysuria, urgency, frequency, hematuria and flank pain.  Musculoskeletal: Negative for myalgias, back pain, joint pain and falls.  Skin: Negative for itching and rash.  Neurological: Negative for tingling, tremors, sensory change, speech change,  focal weakness, loss of consciousness and headaches.  Endo/Heme/Allergies: Negative for environmental allergies and polydipsia. Does not bruise/bleed easily.   Psychiatric/Behavioral: Negative for suicidal ideas. The patient is not nervous/anxious.      Past Medical History  Diagnosis Date  . Hypertension   . Small bowel obstruction   . BPH (benign prostatic hyperplasia)   . Neuropathy   . Polio   . Polio Childhood  . Constipation 05/13/2013  . Anemia 05/13/2013  . Type II diabetes mellitus   . Exertional shortness of breath     "sometimes" (07/25/2013)  . History of stomach ulcers   . Stroke 2014    residual:  "left hand kind of numb" (07/25/2013)  . Arthritis     "right leg" (07/25/2013)    Past Surgical History  Procedure Laterality Date  . Bowel resection    . Cataract extraction w/ intraocular lens  implant, bilateral Bilateral 2000's  . Colonoscopy      Hx: of  . Laparoscopic cholecystectomy  07/25/2013    w/LOA (07/25/2013)  . Excisional hemorrhoidectomy  2000's  . Colon surgery    . Inguinal hernia repair Right 1970's  . Cholecystectomy N/A 07/25/2013    Procedure: LAPAROSCOPIC CHOLECYSTECTOMY WITH INTRAOPERATIVE CHOLANGIOGRAM;  Surgeon: [name], MD;  Location: [place];  Service: General;  Laterality: N/A;    Social History:  reports that he has never smoked. He has never used smokeless tobacco. He reports that he does not drink alcohol or use illicit drugs.  No Known Allergies  Family History  Problem Relation Age of Onset  . Diabetes Sister   . Diabetes Sister   . Diabetes Brother   . Hypertension Sister   . Hypertension Sister   . Hypertension Brother   . Heart failure Mother   . Heart attack Sister     Prior to Admission medications   Medication Sig Start Date End Date Taking? Authorizing Provider  gabapentin (NEURONTIN) 800 MG tablet Take 800 mg by mouth 3 (three) times daily as needed (HEADACHES).  05/09/13  Yes Historical Provider, MD  irbesartan (AVAPRO) 300 MG tablet Take 300 mg by mouth at bedtime.   Yes Historical Provider, MD  polyethylene glycol (MIRALAX / GLYCOLAX) packet Take 17 g by mouth 3  (three) times daily. 05/19/13  Yes Robbie Lis, MD  predniSONE (DELTASONE) 5 MG tablet Take 5 mg by mouth daily with breakfast.   Yes Historical Provider, MD  Tamsulosin HCl (FLOMAX) 0.4 MG CAPS Take 0.4 mg by mouth 2 (two) times daily.    Yes Historical Provider, MD  Travoprost, BAK Free, (TRAVATAN) 0.004 % SOLN ophthalmic solution Place 1 drop into both eyes at bedtime.   Yes Historical Provider, MD    Physical Exam: Filed Vitals:   08/14/13 1605 08/14/13 1730 08/14/13 1800 08/14/13 1830  BP:  117/56 137/64 110/58  Pulse:  98 101 96  Temp: 99.7 F (37.6 C)     TempSrc: Oral     Resp:  15 18 17   SpO2:  91% 94% 92%    Physical Exam  Constitutional: Appears chronically ill, in mild distress due to pain  HENT: Normocephalic. External right and left ear normal. Dry MM Eyes: Conjunctivae and EOM are normal. PERRLA, no scleral icterus.  Neck: Normal ROM. Neck supple. No JVD. No tracheal deviation. No thyromegaly.  CVS: RRR, S1/S2 +, no murmurs, no gallops, no carotid bruit.  Pulmonary: Effort and breath sounds normal, no stridor, rhonchi, diminished breath sounds at bases Abdominal:  Soft but distended with tenderness in upper abdominal quadrants Musculoskeletal: Normal range of motion. No edema and no tenderness.  Lymphadenopathy: No lymphadenopathy noted, cervical, inguinal. Neuro: Alert. Normal reflexes, muscle tone coordination. No cranial nerve deficit. Skin: Skin is warm and dry. No rash noted. Not diaphoretic. No erythema. No pallor.  Psychiatric: Unable to assess as pt only follows some commands and answers few questions.   Labs on Admission:  Basic Metabolic Panel:  Recent Labs Lab 08/14/13 1345  NA 136*  K 5.2  CL 97  CO2 23  GLUCOSE 290*  BUN 31*  CREATININE 1.06  CALCIUM 8.6   Liver Function Tests:  Recent Labs Lab 08/14/13 1345  AST 60*  ALT 45  ALKPHOS 281*  BILITOT 0.6  PROT 7.5  ALBUMIN 2.1*   CBC:  Recent Labs Lab 08/14/13 1345  WBC 20.9*   NEUTROABS 20.4*  HGB 9.8*  HCT 30.0*  MCV 81.7  PLT 311   Cardiac Enzymes:  Recent Labs Lab 08/14/13 1345  TROPONINI <0.30   EKG: Normal sinus rhythm, no ST/T wave changes  Faye Ramsay, MD  [facility] Pager [phone]  If 7PM-7AM, please contact night-coverage [email or web address] Password [facility] 08/14/2013, 6:37 PM

## 2013-08-14 NOTE — Assessment & Plan Note (Addendum)
Continues with nystatin swish and spit and completed diflucan.  Has improved some, but is still on steroids so difficult to treat.  Cont nystatin and encourage soft foods and drinks.

## 2013-08-14 NOTE — Assessment & Plan Note (Addendum)
Most likely from the steroids, but can also be an underlying infection in view of his poor recovery.  Await CT.  Stop florastor while on iv abx as this can cause yeast infections of iv sites.

## 2013-08-14 NOTE — ED Notes (Signed)
General Surgery at bedside.

## 2013-08-14 NOTE — ED Notes (Addendum)
Attempted to call report. RN in report and will call back.

## 2013-08-14 NOTE — ED Provider Notes (Signed)
CSN: [id number]     Arrival date & time 08/14/13  1324 History   First MD Initiated Contact with Patient 08/14/13 1328     Chief Complaint  Patient presents with  . Fever  . Post-op Problem     (Consider location/radiation/quality/duration/timing/severity/associated sxs/prior Treatment) HPI Comments: Patient is a 78 year old male with history of polio, diabetes, hypertension. He recently underwent laparoscopic cholecystectomy and went to a rehabilitation facility postoperatively. He was noted to have a fever starting yesterday evening. He is complaining of abdominal discomfort. He denies any vomiting or diarrhea. He denies any urinary complaints. He denies any chest congestion, pain, or cough.  Patient is a 78 y.o. male presenting with fever. The history is provided by the patient and the spouse.  Fever Temp source:  Oral Severity:  Moderate Onset quality:  Sudden Duration:  1 day Timing:  Constant Progression:  Worsening Chronicity:  New Relieved by:  Nothing Worsened by:  Nothing tried Ineffective treatments:  None tried Associated symptoms: no chills, no confusion, no congestion, no cough, no diarrhea, no dysuria and no headaches     Past Medical History  Diagnosis Date  . Hypertension   . Small bowel obstruction   . BPH (benign prostatic hyperplasia)   . Neuropathy   . Polio   . Polio Childhood  . Constipation 05/13/2013  . Anemia 05/13/2013  . Type II diabetes mellitus   . Exertional shortness of breath     "sometimes" (07/25/2013)  . History of stomach ulcers   . Stroke 2014    residual:  "left hand kind of numb" (07/25/2013)  . Arthritis     "right leg" (07/25/2013)   Past Surgical History  Procedure Laterality Date  . Bowel resection    . Cataract extraction w/ intraocular lens  implant, bilateral Bilateral 2000's  . Colonoscopy      Hx: of  . Laparoscopic cholecystectomy  07/25/2013    w/LOA (07/25/2013)  . Excisional hemorrhoidectomy  2000's  . Colon surgery     . Inguinal hernia repair Right 1970's  . Cholecystectomy N/A 07/25/2013    Procedure: LAPAROSCOPIC CHOLECYSTECTOMY WITH INTRAOPERATIVE CHOLANGIOGRAM;  Surgeon: [name], MD;  Location: [place];  Service: General;  Laterality: N/A;   Family History  Problem Relation Age of Onset  . Diabetes Sister   . Diabetes Sister   . Diabetes Brother   . Hypertension Sister   . Hypertension Sister   . Hypertension Brother   . Heart failure Mother   . Heart attack Sister    History  Substance Use Topics  . Smoking status: Never Smoker   . Smokeless tobacco: Never Used  . Alcohol Use: No    Review of Systems  Constitutional: Positive for fever and fatigue. Negative for chills.  HENT: Negative for congestion.   Respiratory: Negative for cough.   Gastrointestinal: Negative for diarrhea.  Genitourinary: Negative for dysuria.  Neurological: Negative for headaches.  Psychiatric/Behavioral: Negative for confusion.  All other systems reviewed and are negative.      Allergies  Review of patient's allergies indicates no known allergies.  Home Medications   Current Outpatient Rx  Name  Route  Sig  Dispense  Refill  . amLODipine (NORVASC) 5 MG tablet   Oral   Take 5 mg by mouth every morning.          Marland Kitchen atorvastatin (LIPITOR) 20 MG tablet   Oral   Take 1 tablet by mouth every evening.         Marland Kitchen  Ciprofloxacin (CIPRO IV)   Intravenous   Inject 400 mg into the vein 2 (two) times daily.         Marland Kitchen docusate sodium 100 MG CAPS   Oral   Take 100 mg by mouth 2 (two) times daily.   10 capsule   0   . gabapentin (NEURONTIN) 800 MG tablet   Oral   Take 1 tablet by mouth 3 (three) times daily as needed (HEADACHES).          . insulin aspart (NOVOLOG) 100 UNIT/ML injection   Subcutaneous   Inject 5 Units into the skin 3 (three) times daily before meals.          . irbesartan (AVAPRO) 300 MG tablet   Oral   Take 300 mg by mouth at bedtime.         . metroNIDAZOLE  (FLAGYL) 5-0.79 MG/ML-% IVPB   Intravenous   Inject 500 mg into the vein every 6 (six) hours.         Marland Kitchen nystatin (MYCOSTATIN) 100000 UNIT/ML suspension   Oral   Take 5 mLs by mouth 2 (two) times daily.         . polyethylene glycol (MIRALAX / GLYCOLAX) packet   Oral   Take 17 g by mouth 3 (three) times daily.   14 each   0   . predniSONE (DELTASONE) 5 MG tablet   Oral   Take 5 mg by mouth daily with breakfast.         . Skin Protectants, Misc. (CALAZIME SKIN PROTECTANT) PSTE   Apply externally   Apply topically.         . Tamsulosin HCl (FLOMAX) 0.4 MG CAPS   Oral   Take 0.4 mg by mouth 2 (two) times daily.          . Travoprost, BAK Free, (TRAVATAN) 0.004 % SOLN ophthalmic solution   Both Eyes   Place 1 drop into both eyes at bedtime.          BP 161/77  Pulse 128  Temp(Src) 101.6 F (38.7 C)  Resp 20  SpO2 100% Physical Exam  Nursing note and vitals reviewed. Constitutional: He is oriented to person, place, and time.  Patient is an elderly male who appears somewhat cachectic. He is awake and alert.  HENT:  Head: Normocephalic and atraumatic.  Mouth/Throat: Oropharynx is clear and moist.  Neck: Normal range of motion. Neck supple.  Cardiovascular: Normal rate, regular rhythm and normal heart sounds.   No murmur heard. Pulmonary/Chest: Effort normal and breath sounds normal. No respiratory distress. He has no wheezes.  Abdominal: Bowel sounds are normal. He exhibits no mass. There is tenderness. There is no rebound and no guarding.  Abdomen is somewhat distended and tympanitic. There is tenderness to palpation in the epigastric region without rebound or guarding.  Musculoskeletal: Normal range of motion. He exhibits no edema.  Lymphadenopathy:    He has no cervical adenopathy.  Neurological: He is alert and oriented to person, place, and time.  Skin: Skin is warm and dry.    ED Course  Procedures (including critical care time) Labs Review Labs  Reviewed  CULTURE, BLOOD (ROUTINE X 2)  CULTURE, BLOOD (ROUTINE X 2)  CBC WITH DIFFERENTIAL  COMPREHENSIVE METABOLIC PANEL  TROPONIN I  LACTIC ACID, PLASMA  URINALYSIS, ROUTINE W REFLEX MICROSCOPIC  I-STAT CG4 LACTIC ACID, ED   Imaging Review No results found.    MDM   Final diagnoses:  None  The patient is a 78 year old male with recent gallbladder surgery. He presents today with fever, increased abdominal pain, and increased heart rate. Workup reveals an elevated white count of 21,000 and CT scan shows multiple intra-abdominal abscesses. I've spoken with general surgery. Dr. Grandville Silos will evaluate the patient recommends admission to internal medicine. I've spoken with Dr. Olen Pel who agrees to admit. The patient was given vancomycin and Zosyn in the ER.    Veryl Speak, MD 08/14/13 [phone]

## 2013-08-14 NOTE — ED Notes (Signed)
2nd attempt to call report to 3S

## 2013-08-14 NOTE — Assessment & Plan Note (Signed)
Not recovering well.  Await CT abd/pelvis to r/o abscess as etiology of poor recovery after chole.  He's had a prior stroke also.  Very weak and has difficulty participating in therapy and needs full adl assist at this time.

## 2013-08-14 NOTE — Progress Notes (Signed)
Patient ID: Mike Mack, male   DOB: 10-Jun-1936, 78 y.o.   MRN: [id number]    Mike Mack living [place]  No Known Allergies  Chief Complaint  Patient presents with  . Acute Visit    chills, low o2 sat, abdominal pain, poor po intake   HPI 78 y/o male pt here for STR after hospital admission for laparoscopic lysis of adhesions, laparoscopic cholecystectomy with cholangiogram. He was seen last week and started on iv flagyl and cipro for empiric coverage of intrabdominal infection post surgery. His white count has trended down some and remains afebrile. He was seen in his room to be having chills and is confused at present. He complaints of abdominal discomfort and refuses to eat this week. He mentions difficulty and pain with swallowing. He had a bowel movement yesterday. No vomiting noted  Review of Systems  Constitutional: Negative for fever, diaphoresis.  HENT: Negative for congestion Respiratory: Negative for cough, sputum production. Appears slightly dyspneic at present Cardiovascular: Negative for chest pain Gastrointestinal: upper quadrant pointed for pain Musculoskeletal: Negative for back pain Skin: Negative for itching and rash.  Neurological: has weakness Psychiatric/Behavioral: confused  Past Medical History  Diagnosis Date  . Hypertension   . Small bowel obstruction   . BPH (benign prostatic hyperplasia)   . Neuropathy   . Polio   . Polio Childhood  . Constipation 05/13/2013  . Anemia 05/13/2013  . Type II diabetes mellitus   . Exertional shortness of breath     "sometimes" (07/25/2013)  . History of stomach ulcers   . Stroke 2014    residual:  "left hand kind of numb" (07/25/2013)  . Arthritis     "right leg" (07/25/2013)   Current Outpatient Prescriptions on File Prior to Visit  Medication Sig Dispense Refill  . amLODipine (NORVASC) 5 MG tablet Take 5 mg by mouth every morning.       Marland Kitchen atorvastatin (LIPITOR) 20 MG tablet Take 1 tablet by mouth every evening.       . Ciprofloxacin (CIPRO IV) Inject 400 mg into the vein 2 (two) times daily.      Marland Kitchen docusate sodium 100 MG CAPS Take 100 mg by mouth 2 (two) times daily.  10 capsule  0  . gabapentin (NEURONTIN) 800 MG tablet Take 1 tablet by mouth 3 (three) times daily as needed (HEADACHES).       . insulin aspart (NOVOLOG) 100 UNIT/ML injection Inject 5 Units into the skin 3 (three) times daily before meals.       . irbesartan (AVAPRO) 300 MG tablet Take 300 mg by mouth at bedtime.      . metroNIDAZOLE (FLAGYL) 5-0.79 MG/ML-% IVPB Inject 500 mg into the vein every 6 (six) hours.      Marland Kitchen nystatin (MYCOSTATIN) 100000 UNIT/ML suspension Take 5 mLs by mouth 2 (two) times daily.      . polyethylene glycol (MIRALAX / GLYCOLAX) packet Take 17 g by mouth 3 (three) times daily.  14 each  0  . predniSONE (DELTASONE) 5 MG tablet Take 5 mg by mouth daily with breakfast.      . Skin Protectants, Misc. (CALAZIME SKIN PROTECTANT) PSTE Apply topically.      . Tamsulosin HCl (FLOMAX) 0.4 MG CAPS Take 0.4 mg by mouth 2 (two) times daily.       . Travoprost, BAK Free, (TRAVATAN) 0.004 % SOLN ophthalmic solution Place 1 drop into both eyes at bedtime.       No current facility-administered medications on  file prior to visit.  Marland Kitchen      Physical exam BP 118/76  Pulse 155  Temp(Src) 98.8 F (37.1 C)  Resp 20  SpO2 86%  Constitutional: ill appearing, mild distress Cardiovascular: tachycardic Pulmonary: increased effort, poor air entry, started on 2 l o2 by nasal canula and o2 upto 94% Abdominal: distended, tender mainly in epigastric ad RUQ area, hypoactive bowel sounds Musculoskeletal: right sided hemiparesis Skin: Incision site w/o erythema, has eschar with serosanguinous drainage on his shirt Psychiatric: Lethargic, confused   Labs- 08/04/13 wbc 20.6, hb 8.5, hct 25.9, plt 636, granulocyte 85, na 131, k 4.8, co2 25, glu 192, bun 18, cr 1.04, ca 7.9, t.bil 1, alp 173, ast 65, alt 73, t.pro 6, alb 2.4   08/06/13 wbc  20.1, hb 8.9, hct 27.3, plt 745, na 133, k 4.9, glu 242, bun 26, cr 1.3, t.bil 0.8, alp 186, ast 65, alt 90, t.pro 5.9, alb 2.4, ca 8 08/11/13:  Wbc 16.2, h/h 8.6/26.9, plts 420; Na 134, K 4.9, BUN 25, cr 1.31, Ca 7.9  Assessment/plan  Abdominal pain- has been on iv cipro and flagyl for a week now. No improvement noted. Concerns for intra-abdominal abscess with need for drainage and antibiotics. With his tachycardia, drop in o2 saturation and chills and abdomen being the source of infection, concern for ongoing SIRS/ sepsis. Will send him to the ED for stat abdominal imaging, evaluation and further management.  Acute encephalopathy- likely in setting of infection, poor po intake with possible hypovolemia  Hypoxia- possible aspiration pneumonitis. Will need cxr to rule out pulmonary pathology. Continue o2 for now  Odynophagia- concern for candidal esophagitis in setting of his immunocompromised state, use of prednisone and debility. Evaluate for need for treatment with fluconazole for 3-4 weeks after addressing the acute issue  Spent more than 50 minutes arranging pt care and transfer. Explained to wife and son about current medical situation for him

## 2013-08-14 NOTE — Assessment & Plan Note (Addendum)
Has improved slightly to 16 from 20 with cipro and flagyl plus fluids.  Multifactorial as well.  Prednisone, possible abdominal infection/abscess, stress response.  Await CT results.

## 2013-08-14 NOTE — ED Notes (Signed)
Lactic acid results called to primary nurse Chrissie Noa

## 2013-08-14 NOTE — Assessment & Plan Note (Addendum)
High risk for this with prior stroke. He is on steroids, had hyponatremia, mild elevation of his creatinine from poor po intake in context of thrush, is hyperglycemic, on abx including a quinolone, in snf environment, having some abdominal pain, had recent surgery-- all of which likely contribute. Encourage wife to be present and staff to keep him oriented as best we can.  Also encourage proper sleep/wake cycles and avoid sedative/hypnotics/benzos that will worsen his mental status even more.  Will stop vesicare due to anticholinergic effects and lack of benefit at this point.

## 2013-08-15 ENCOUNTER — Encounter (HOSPITAL_COMMUNITY): Payer: Self-pay | Admitting: Radiology

## 2013-08-15 ENCOUNTER — Inpatient Hospital Stay (HOSPITAL_COMMUNITY): Payer: Medicare Other

## 2013-08-15 DIAGNOSIS — A419 Sepsis, unspecified organism: Secondary | ICD-10-CM | POA: Diagnosis present

## 2013-08-15 DIAGNOSIS — K651 Peritoneal abscess: Secondary | ICD-10-CM | POA: Diagnosis present

## 2013-08-15 DIAGNOSIS — R6520 Severe sepsis without septic shock: Secondary | ICD-10-CM

## 2013-08-15 DIAGNOSIS — K750 Abscess of liver: Secondary | ICD-10-CM

## 2013-08-15 DIAGNOSIS — B370 Candidal stomatitis: Secondary | ICD-10-CM

## 2013-08-15 HISTORY — DX: Abscess of liver: K75.0

## 2013-08-15 LAB — TYPE AND SCREEN
ABO/RH(D): A POS
Antibody Screen: NEGATIVE

## 2013-08-15 LAB — BASIC METABOLIC PANEL
BUN: 31 mg/dL — AB (ref 6–23)
CO2: 26 mEq/L (ref 19–32)
Calcium: 8.1 mg/dL — ABNORMAL LOW (ref 8.4–10.5)
Chloride: 107 mEq/L (ref 96–112)
Creatinine, Ser: 1.18 mg/dL (ref 0.50–1.35)
GFR calc Af Amer: 68 mL/min — ABNORMAL LOW (ref >90)
GFR, EST NON AFRICAN AMERICAN: 59 mL/min — AB (ref >90)
Glucose, Bld: 218 mg/dL — ABNORMAL HIGH (ref 70–99)
Potassium: 4.2 mEq/L (ref 3.7–5.3)
Sodium: 144 mEq/L (ref 137–147)

## 2013-08-15 LAB — GLUCOSE, CAPILLARY
GLUCOSE-CAPILLARY: 268 mg/dL — AB (ref 70–99)
GLUCOSE-CAPILLARY: 204 mg/dL — AB (ref 70–99)
Glucose-Capillary: 295 mg/dL — ABNORMAL HIGH (ref 70–99)
Glucose-Capillary: 218 mg/dL — ABNORMAL HIGH (ref 70–99)
Glucose-Capillary: 162 mg/dL — ABNORMAL HIGH (ref 70–99)

## 2013-08-15 LAB — COMPREHENSIVE METABOLIC PANEL
ALK PHOS: 246 U/L — AB (ref 39–117)
ALT: 43 U/L (ref 0–53)
AST: 57 U/L — ABNORMAL HIGH (ref 0–37)
Albumin: 1.8 g/dL — ABNORMAL LOW (ref 3.5–5.2)
BUN: 29 mg/dL — ABNORMAL HIGH (ref 6–23)
CO2: 26 mEq/L (ref 19–32)
Calcium: 8 mg/dL — ABNORMAL LOW (ref 8.4–10.5)
Chloride: 106 mEq/L (ref 96–112)
Creatinine, Ser: 1.04 mg/dL (ref 0.50–1.35)
GFR calc Af Amer: 79 mL/min — ABNORMAL LOW (ref >90)
GFR calc non Af Amer: 68 mL/min — ABNORMAL LOW (ref >90)
GLUCOSE: 238 mg/dL — AB (ref 70–99)
POTASSIUM: 4.8 meq/L (ref 3.7–5.3)
SODIUM: 140 meq/L (ref 137–147)
TOTAL PROTEIN: 6.6 g/dL (ref 6.0–8.3)
Total Bilirubin: 0.4 mg/dL (ref 0.3–1.2)

## 2013-08-15 LAB — CBC
HCT: 26 % — ABNORMAL LOW (ref 39.0–52.0)
HEMOGLOBIN: 8.7 g/dL — AB (ref 13.0–17.0)
MCH: 27.1 pg (ref 26.0–34.0)
MCHC: 33.5 g/dL (ref 30.0–36.0)
MCV: 81 fL (ref 78.0–100.0)
Platelets: 254 10*3/uL (ref 150–400)
RBC: 3.21 MIL/uL — ABNORMAL LOW (ref 4.22–5.81)
RDW: 17 % — ABNORMAL HIGH (ref 11.5–15.5)
WBC: 19 10*3/uL — ABNORMAL HIGH (ref 4.0–10.5)

## 2013-08-15 LAB — HEMOGLOBIN A1C
Hgb A1c MFr Bld: 7.3 % — ABNORMAL HIGH (ref <5.7)
Mean Plasma Glucose: 163 mg/dL — ABNORMAL HIGH (ref <117)

## 2013-08-15 LAB — PROTIME-INR
INR: 1.7 — AB (ref 0.00–1.49)
PROTHROMBIN TIME: 19.5 s — AB (ref 11.6–15.2)

## 2013-08-15 LAB — APTT: APTT: 33 s (ref 24–37)

## 2013-08-15 LAB — TSH: TSH: 2.3 u[IU]/mL (ref 0.350–4.500)

## 2013-08-15 LAB — RAPID STREP SCREEN (MED CTR MEBANE ONLY): Streptococcus, Group A Screen (Direct): NEGATIVE

## 2013-08-15 MED ORDER — GABAPENTIN 800 MG PO TABS: 800.0000 mg | ORAL_TABLET | Freq: Three times a day (TID) | ORAL | Status: DC | PRN

## 2013-08-15 MED ORDER — ACETAMINOPHEN 325 MG PO TABS
650.0000 mg | ORAL_TABLET | ORAL | Status: DC | PRN
  Administered 2013-08-19 – 2013-09-06 (×7): 650 mg via ORAL
  Filled 2013-08-15 (×7): qty 2

## 2013-08-15 MED ORDER — MIDAZOLAM HCL 2 MG/2ML IJ SOLN
INTRAMUSCULAR | Status: AC
  Filled 2013-08-15: qty 4

## 2013-08-15 MED ORDER — ACETAMINOPHEN 650 MG RE SUPP
650.0000 mg | RECTAL | Status: DC | PRN
Start: 2013-08-15 — End: 2013-08-30
  Administered 2013-08-15 (×2): 650 mg via RECTAL
  Filled 2013-08-15 (×2): qty 1

## 2013-08-15 MED ORDER — LIDOCAINE HCL 1 % IJ SOLN
INTRAMUSCULAR | Status: AC
  Filled 2013-08-15: qty 10

## 2013-08-15 MED ORDER — TECHNETIUM TC 99M MEBROFENIN IV KIT
5.0000 | PACK | Freq: Once | INTRAVENOUS | Status: AC | PRN
  Administered 2013-08-15: 5 via INTRAVENOUS

## 2013-08-15 MED ORDER — MIDAZOLAM HCL 2 MG/2ML IJ SOLN
INTRAMUSCULAR | Status: AC | PRN
  Administered 2013-08-15: 2 mg via INTRAVENOUS
  Administered 2013-08-15: 1 mg via INTRAVENOUS

## 2013-08-15 MED ORDER — INSULIN GLARGINE 100 UNITS/ML SOLOSTAR PEN
5.0000 [IU] | PEN_INJECTOR | Freq: Every day | SUBCUTANEOUS | Status: DC
  Filled 2013-08-15: qty 3

## 2013-08-15 MED ORDER — FENTANYL CITRATE 0.05 MG/ML IJ SOLN
INTRAMUSCULAR | Status: AC | PRN
  Administered 2013-08-15 (×2): 50 ug via INTRAVENOUS

## 2013-08-15 MED ORDER — INSULIN GLARGINE 100 UNIT/ML ~~LOC~~ SOLN
5.0000 [IU] | Freq: Every day | SUBCUTANEOUS | Status: DC
  Administered 2013-08-15 – 2013-08-26 (×12): 5 [IU] via SUBCUTANEOUS
  Filled 2013-08-15 (×14): qty 0.05

## 2013-08-15 MED ORDER — FENTANYL CITRATE 0.05 MG/ML IJ SOLN
INTRAMUSCULAR | Status: AC
  Filled 2013-08-15: qty 4

## 2013-08-15 MED ORDER — SODIUM CHLORIDE 0.9 % IV SOLN
INTRAVENOUS | Status: DC
  Administered 2013-08-15 (×2): via INTRAVENOUS
  Administered 2013-08-15: 1000 mL via INTRAVENOUS
  Administered 2013-08-16 (×2): via INTRAVENOUS
  Administered 2013-08-17: 75 mL/h via INTRAVENOUS

## 2013-08-15 MED ORDER — VITAMIN K1 10 MG/ML IJ SOLN
5.0000 mg | Freq: Once | INTRAVENOUS | Status: AC
  Administered 2013-08-15: 5 mg via INTRAVENOUS
  Filled 2013-08-15: qty 0.5

## 2013-08-15 MED ORDER — MENTHOL 3 MG MT LOZG
1.0000 | LOZENGE | OROMUCOSAL | Status: DC | PRN
Start: 2013-08-15 — End: 2013-09-06
  Administered 2013-08-17: 3 mg via ORAL
  Filled 2013-08-15 (×2): qty 9

## 2013-08-15 NOTE — Progress Notes (Signed)
[facility] Progress Note [place] TEAM 1 - Stepdown/ICU TEAM   Mike Mack UVO:[id number] DOB: Nov 18, 1935 DOA: 08/14/2013 PCP: [name], MD  Brief narrative: Mike Mack is a 78 y.o. male presenting on 08/14/2013 with HTN, HLD, prior stroke, recent laparoscopic cholecystectomy 07/25/2013 by [name], and discharged to [facility] 07/30/2013, now presented to [facility] ED with main concern of several days duration of persistent epigastric abdominal pain, throbbing and 5/10 in severity, non radiating, associated with fevers, chills, malaise, poor oral intake, no specific aggravating or alleviating factors. No similar events in the past. Please note that pt is poor historian and family at bedside helped with providing history. There has been reported exertional shortness of breath and chest tightness but details not available. Pt currently denies chest pain or shortness of breath.  In ED, CT abdomen notable for abscess in the gallbladder bed, hepatic abscess, and an additional fluid collection in the abdomen. Surgery consulted and [facility] asked to admit for further evaluation. Pt started on Vancomycin and Zosyn in ED.   Subjective: No abdominal pain. Having rigors again.   Assessment/Plan: Principal Problem:   Severe sepsis/  Hepatic abscess/  Right upper quadrant abdominal abscess - post surgical- for perc drain today - bacteremia with gr pos cocci- f/u  Active Problems:   Diabetes mellitus with Neuropathy - cont lantus and sliding scale    Hypertension - hold ARB due to dehydration - BP stable for now    H/o Stroke - med rec does not mention ASA or Statin    Anemia - follow   Chronic steroid use - no need for stress dose steroids currently  Thrush - cont Nystatin - has sore throat as well which may be consistent with candida esopahagitis  BPH - Flomax  Elevated INR - due to poor nutrition?- give 1 U FFP prior to procedure and Vit K     Code Status: Full  code Family Communication: with wife Disposition Plan: follow in SDU  Consultants: Surg  IR  Procedures: none  Antibiotics: Antibiotics Given (last 72 hours)   Date/Time Action Medication Dose Rate   08/15/13 0126 Given   piperacillin-tazobactam (ZOSYN) IVPB 3.375 g 3.375 g 12.5 mL/hr   08/15/13 1059 Given   piperacillin-tazobactam (ZOSYN) IVPB 3.375 g 3.375 g 12.5 mL/hr   08/15/13 1313 Given   vancomycin (VANCOCIN) 1,250 mg in sodium chloride 0.9 % 250 mL IVPB 1,250 mg 166.7 mL/hr       DVT prophylaxis: Lovenox  Objective: Filed Weights   08/14/13 2300 08/15/13 0500  Weight: 59.8 kg (131 lb 13.4 oz) 61.1 kg (134 lb 11.2 oz)   Blood pressure 147/65, pulse 99, temperature 100.2 F (37.9 C), temperature source Oral, resp. rate 21, height 5\' 5"  (1.651 m), weight 61.1 kg (134 lb 11.2 oz), SpO2 99.00%.  Intake/Output Summary (Last 24 hours) at 08/15/13 1347 Last data filed at 08/15/13 1200  Gross per 24 hour  Intake    350 ml  Output   1025 ml  Net   -675 ml     Exam: General: No acute respiratory distress- rigors Lungs: Clear to auscultation bilaterally without wheezes or crackles Cardiovascular: Regular rate and rhythm without murmur gallop or rub normal S1 and S2- tachycardic SR in 130s Abdomen: tender in RUQ, nondistended, soft, bowel sounds positive, no rebound, no ascites, no appreciable mass Extremities: No significant cyanosis, clubbing, or edema bilateral lower extremities  Data Reviewed: Basic Metabolic Panel:  Recent Labs Lab 08/14/13 1345 08/14/13 2200 08/15/13 0233  NA 136*  --  140  K 5.2  --  4.8  CL 97  --  106  CO2 23  --  26  GLUCOSE 290*  --  238*  BUN 31*  --  29*  CREATININE 1.06  --  1.04  CALCIUM 8.6  --  8.0*  MG  --  2.1  --   PHOS  --  3.1  --    Liver Function Tests:  Recent Labs Lab 08/14/13 1345 08/15/13 0233  AST 60* 57*  ALT 45 43  ALKPHOS 281* 246*  BILITOT 0.6 0.4  PROT 7.5 6.6  ALBUMIN 2.1* 1.8*   No  results found for this basename: LIPASE, AMYLASE,  in the last 168 hours No results found for this basename: AMMONIA,  in the last 168 hours CBC:  Recent Labs Lab 08/14/13 1345 08/15/13 0233  WBC 20.9* 19.0*  NEUTROABS 20.4*  --   HGB 9.8* 8.7*  HCT 30.0* 26.0*  MCV 81.7 81.0  PLT 311 254   Cardiac Enzymes:  Recent Labs Lab 08/14/13 1345  TROPONINI <0.30   BNP (last 3 results) No results found for this basename: PROBNP,  in the last 8760 hours CBG:  Recent Labs Lab 08/14/13 2035 08/15/13 0929 08/15/13 1148  GLUCAP 295* 268* 218*    Recent Results (from the past 240 hour(s))  CULTURE, BLOOD (ROUTINE X 2)     Status: None   Collection Time    08/14/13  2:38 PM      Result Value Ref Range Status   Specimen Description BLOOD ARM LEFT   Final   Special Requests BOTTLES DRAWN AEROBIC AND ANAEROBIC 5CC   Final   Culture  Setup Time     Final   Value: 08/14/2013 20:43     Performed at [facility]   Culture     Final   Value: [place] PAIRS AND CHAINS     Note: Gram Stain Report Called to,Read Back By and Verified With: Drema Dallas 08/15/12 0815 BY [email or web address]     Performed at [facility]   Report Status PENDING   Incomplete  CULTURE, BLOOD (ROUTINE X 2)     Status: None   Collection Time    08/14/13  2:52 PM      Result Value Ref Range Status   Specimen Description BLOOD RIGHT FOREARM   Final   Special Requests BOTTLES DRAWN AEROBIC AND ANAEROBIC 5CC   Final   Culture  Setup Time     Final   Value: 08/14/2013 20:43     Performed at [facility]   Culture     Final   Value: GRAM POSITIVE COCCI IN PAIRS AND CHAINS     Note: Gram Stain Report Called to,Read Back By and Verified With: Drema Dallas 08/15/13 0815 BY [email or web address]     Performed at [facility]   Report Status PENDING   Incomplete  RAPID STREP SCREEN     Status: None   Collection Time    08/15/13 11:42 AM      Result Value Ref Range Status    Streptococcus, Group A Screen (Direct) NEGATIVE  NEGATIVE Final   Comment: (NOTE)     A Rapid Antigen test may result negative if the antigen level in the     sample is below the detection level of this test. The FDA has not     cleared this test as a stand-alone test therefore the  rapid antigen     negative result has reflexed to a Group A Strep culture.     Studies:  Recent x-ray studies have been reviewed in detail by the Attending Physician  Scheduled Meds:  Scheduled Meds: . docusate sodium  100 mg Oral BID  . enoxaparin (LOVENOX) injection  40 mg Subcutaneous Q24H  . insulin aspart  0-15 Units Subcutaneous TID WC  . insulin aspart  0-5 Units Subcutaneous QHS  . insulin glargine  5 Units Subcutaneous QHS  . latanoprost  1 drop Both Eyes QHS  . lidocaine      . nystatin  5 mL Oral BID  . piperacillin-tazobactam (ZOSYN)  IV  3.375 g Intravenous Q8H  . polyethylene glycol  17 g Oral TID  . predniSONE  5 mg Oral Q breakfast  . tamsulosin  0.4 mg Oral BID  . vancomycin  1,250 mg Intravenous Q24H   Continuous Infusions: . sodium chloride 150 mL/hr at 08/15/13 1311    Time spent on care of this patient: >35 min   Debbe Odea, MD  [facility] Office  [phone] Pager - Text Page per Shea Evans as per below:  On-Call/Text Page:      [email or web address]      password [email or web address]  If 7PM-7AM, please contact night-coverage [email or web address] Password [email or web address] 08/15/2013, 1:47 PM   LOS: 1 day

## 2013-08-15 NOTE — Progress Notes (Addendum)
Inpatient Diabetes Program Recommendations  AACE/ADA: New Consensus Statement on Inpatient Glycemic Control (2013)  Target Ranges:  Prepandial:   less than 140 mg/dL      Peak postprandial:   less than 180 mg/dL (1-2 hours)      Critically ill patients:  140 - 180 mg/dL     Results for Mike Mack, Mike Mack (MRN [id number]) as of 08/15/2013 08:06  Ref. Range 08/14/2013 20:35  Glucose-Capillary Latest Range: 70-99 mg/dL 295 (H)    Results for Mike Mack, Mike Mack (MRN [id number]) as of 08/15/2013 08:06  Ref. Range 08/15/2013 02:33  Glucose Latest Range: 70-99 mg/dL 238 (H)     Home DM meds: Lantus 5 units QHS Novolog 5 units tid with meals   **Noted patient admitted with abscess.  Patient is currently NPO.  Elevated glucose levels.   **MD- Please consider the following:  1. Add patient's home Lantus dose- Lantus 5 units QHS 2. Change Novolog Moderate SSI to Q4 hour coverage (currently ordered as tid ac + HS and patient is NPO)   Will follow. Wyn Quaker RN, MSN, CDE Diabetes Coordinator Inpatient Diabetes Program Team Pager: [phone] (8a-10p)

## 2013-08-15 NOTE — Sedation Documentation (Signed)
Pt and INR reviewed with Dr Geroge Baseman.

## 2013-08-15 NOTE — Progress Notes (Signed)
INITIAL NUTRITION ASSESSMENT  DOCUMENTATION CODES Per approved criteria  -Non-severe (moderate) malnutrition in the context of chronic illness   INTERVENTION: Advance diet as medically appropriate, add interventions accordingly RD to follow for nutrition care plan  NUTRITION DIAGNOSIS: Increased nutrient needs related to wound healing as evidenced by estimated nutrition needs  Goal: Pt to meet >/= 90% of their estimated nutrition needs   Monitor:  PO diet advancement & intake, weight, labs, I/O's  Reason for Assessment: Malnutrition Screening Tool Report  78 y.o. male  Admitting Dx: Severe sepsis  ASSESSMENT: 78 yo male with history of diabetes, HTN, HLD, prior stroke, recent laparoscopic cholecystectomy 07/25/2013 by [name], and discharged to [facility] 07/30/2013, now presented to [facility] ED with main concern of several days duration of persistent epigastric abdominal pain, throbbing and 5/10 in severity, non radiating, associated with fevers, chills, malaise, poor oral intake, no specific aggravating or alleviating factors.   No similar events in the past. Please note that pt is poor historian and family at bedside helped with providing history. There has been reported exertional shortness of breath and chest tightness but details not available. Pt currently denies chest pain or shortness of breath.  In ED, CT abdomen notable for abscess in the gallbladder bed, hepatic abscess, and an additional fluid collection in the abdomen. Surgery consulted and [facility] asked to admit for further evaluation. Pt started on Vancomycin and Zosyn in ED.  RD unable to obtain nutrition hx; patient confused upon RD visit; seen per [facility] this AM -- deep tissue injury to heel; pt currently NPO for drain placements (CT revealed biloma & mid epi abscess); pt with increased kcal, protein needs given wound DTI; per weight readings, pt has had a 6% weight loss in < 1 month; RD to monitor for PO diet advancement, add  supplementation when/as able.  Nutrition Focused Physical Exam:  Subcutaneous Fat:  Orbital Region: N/A Upper Arm Region: mild depletion Thoracic and Lumbar Region: N/A  Muscle:  Temple Region: WNL Clavicle Bone Region: mild depletion Clavicle and Acromion Bone Region: mild depletion Scapular Bone Region: N/A Dorsal Hand: N/A Patellar Region: N/A Anterior Thigh Region: moderate depletion Posterior Calf Region: moderate depletion  Edema: none  Patient meets criteria for moderate malnutrition in the context of chronic illness as evidenced by mild-moderate muscle loss and 6% weight loss in < 1 month.  Height: Ht Readings from Last 1 Encounters:  08/14/13 5\' 5"  (1.651 m)    Weight: Wt Readings from Last 1 Encounters:  08/15/13 134 lb 11.2 oz (61.1 kg)    Ideal Body Weight: 136 lb  % Ideal Body Weight: 98%  Wt Readings from Last 10 Encounters:  08/15/13 134 lb 11.2 oz (61.1 kg)  08/12/13 132 lb (59.875 kg)  08/01/13 142 lb (64.411 kg)  07/30/13 143 lb 4.8 oz (65 kg)  07/30/13 143 lb 4.8 oz (65 kg)  07/24/13 145 lb (65.772 kg)  07/21/13 147 lb 12.8 oz (67.042 kg)  06/24/13 144 lb (65.318 kg)  05/16/13 161 lb 6 oz (73.2 kg)  11/27/12 130 lb (58.968 kg)    Usual Body Weight: 143 lb   % Usual Body Weight: 93%  BMI:  Body mass index is 22.42 kg/(m^2).  Estimated Nutritional Needs: Kcal: 1650-1850 Protein: 75-85 gm Fluid: 1.6-1.8 L  Skin: deep tissue injury to R heel  Diet Order: NPO  EDUCATION NEEDS: -No education needs identified at this time   Intake/Output Summary (Last 24 hours) at 08/15/13 1438 Last data filed at  08/15/13 1200  Gross per 24 hour  Intake    350 ml  Output   1025 ml  Net   -675 ml    Labs:   Recent Labs Lab 08/14/13 1345 08/14/13 2200 08/15/13 0233  NA 136*  --  140  K 5.2  --  4.8  CL 97  --  106  CO2 23  --  26  BUN 31*  --  29*  CREATININE 1.06  --  1.04  CALCIUM 8.6  --  8.0*  MG  --  2.1  --   PHOS  --  3.1   --   GLUCOSE 290*  --  238*    CBG (last 3)   Recent Labs  08/14/13 2035 08/15/13 0929 08/15/13 1148  GLUCAP 295* 268* 218*    Scheduled Meds: . docusate sodium  100 mg Oral BID  . enoxaparin (LOVENOX) injection  40 mg Subcutaneous Q24H  . insulin aspart  0-15 Units Subcutaneous TID WC  . insulin aspart  0-5 Units Subcutaneous QHS  . insulin glargine  5 Units Subcutaneous QHS  . latanoprost  1 drop Both Eyes QHS  . lidocaine      . nystatin  5 mL Oral BID  . phytonadione (VITAMIN K) IV  5 mg Intravenous Once  . piperacillin-tazobactam (ZOSYN)  IV  3.375 g Intravenous Q8H  . polyethylene glycol  17 g Oral TID  . predniSONE  5 mg Oral Q breakfast  . tamsulosin  0.4 mg Oral BID  . vancomycin  1,250 mg Intravenous Q24H    Continuous Infusions: . sodium chloride 150 mL/hr at 08/15/13 1311    Past Medical History  Diagnosis Date  . Hypertension   . Small bowel obstruction   . BPH (benign prostatic hyperplasia)   . Neuropathy   . Polio   . Polio Childhood  . Constipation 05/13/2013  . Anemia 05/13/2013  . Type II diabetes mellitus   . Exertional shortness of breath     "sometimes" (07/25/2013)  . History of stomach ulcers   . Stroke 2014    residual:  "left hand kind of numb" (07/25/2013)  . Arthritis     "right leg" (07/25/2013)    Past Surgical History  Procedure Laterality Date  . Bowel resection    . Cataract extraction w/ intraocular lens  implant, bilateral Bilateral 2000's  . Colonoscopy      Hx: of  . Laparoscopic cholecystectomy  07/25/2013    w/LOA (07/25/2013)  . Excisional hemorrhoidectomy  2000's  . Colon surgery    . Inguinal hernia repair Right 1970's  . Cholecystectomy N/A 07/25/2013    Procedure: LAPAROSCOPIC CHOLECYSTECTOMY WITH INTRAOPERATIVE CHOLANGIOGRAM;  Surgeon: [name], MD;  Location: [place];  Service: General;  Laterality: N/A;    Arthur Holms, RD, LDN Pager #: [phone] After-Hours Pager #: [phone]

## 2013-08-15 NOTE — Sedation Documentation (Signed)
New orders of type and cross and FFP transfusion and VIt K infusion noted.  Spoke with Dr Laurence Ferrari and floor nurse.  Will return to floor to do blood work  initiate transfusions.

## 2013-08-15 NOTE — Progress Notes (Signed)
Physician notified: Wynelle Cleveland, in person At: 1126  Regarding: HR [place]. Temp 98.25F, shivering.  Order(s): Give tylenol per rectum

## 2013-08-15 NOTE — Progress Notes (Signed)
AM meds held at this time. Planned IR procedure. Will continue to monitor.

## 2013-08-15 NOTE — Progress Notes (Signed)
ANTIBIOTIC CONSULT NOTE - Follow-up  Pharmacy Consult for vanc/zosyn Indication: Intra-abd infection  No Known Allergies  Patient Measurements: Wt = 61 kg   Ht= 65 in  Vital Signs: Temp: 100.2 F (37.9 C) (02/27 1218) Temp src: Oral (02/27 1218) BP: 147/65 mmHg (02/27 1056) Pulse Rate: 99 (02/27 1056) Intake/Output from previous day: 02/26 0701 - 02/27 0700 In: 350 [I.V.:300; IV Piggyback:50] Out: 700 [Urine:700] Intake/Output from this shift: Total I/O In: -  Out: 325 [Urine:325]  Labs:  Recent Labs  08/14/13 1345 08/15/13 0233  WBC 20.9* 19.0*  HGB 9.8* 8.7*  PLT 311 254  CREATININE 1.06 1.04   Estimated Creatinine Clearance: 53 ml/min (by C-G formula based on Cr of 1.04). No results found for this basename: VANCOTROUGH, Corlis Leak, VANCORANDOM, GENTTROUGH, GENTPEAK, GENTRANDOM, TOBRATROUGH, TOBRAPEAK, TOBRARND, AMIKACINPEAK, AMIKACINTROU, AMIKACIN,  in the last 72 hours   Microbiology: Recent Results (from the past 720 hour(s))  SURGICAL PCR SCREEN     Status: None   Collection Time    07/24/13  3:58 PM      Result Value Ref Range Status   MRSA, PCR NEGATIVE  NEGATIVE Final   Staphylococcus aureus NEGATIVE  NEGATIVE Final   Comment:            The Xpert SA Assay (FDA     approved for NASAL specimens     in patients over 76 years of age),     is one component of     a comprehensive surveillance     program.  Test performance has     been validated by [employer] for patients greater     than or equal to 44 year old.     It is not intended     to diagnose infection nor to     guide or monitor treatment.  URINE CULTURE     Status: None   Collection Time    07/28/13  3:22 PM      Result Value Ref Range Status   Specimen Description URINE, CLEAN CATCH   Final   Special Requests NONE   Final   Culture  Setup Time     Final   Value: 07/28/2013 20:55     Performed at [place]     Final   Value: NO GROWTH     Performed at  [facility]   Culture     Final   Value: NO GROWTH     Performed at [facility]   Report Status 07/29/2013 FINAL   Final  CULTURE, BLOOD (ROUTINE X 2)     Status: None   Collection Time    08/14/13  2:38 PM      Result Value Ref Range Status   Specimen Description BLOOD ARM LEFT   Final   Special Requests BOTTLES DRAWN AEROBIC AND ANAEROBIC 5CC   Final   Culture  Setup Time     Final   Value: 08/14/2013 20:43     Performed at [facility]   Culture     Final   Value: GRAM POSITIVE COCCI IN PAIRS AND CHAINS     Note: Gram Stain Report Called to,Read Back By and Verified With: Drema Dallas 08/15/12 0815 BY [email or web address]     Performed at [facility]   Report Status PENDING   Incomplete  CULTURE, BLOOD (ROUTINE X 2)     Status: None   Collection Time  08/14/13  2:52 PM      Result Value Ref Range Status   Specimen Description BLOOD RIGHT FOREARM   Final   Special Requests BOTTLES DRAWN AEROBIC AND ANAEROBIC 5CC   Final   Culture  Setup Time     Final   Value: 08/14/2013 20:43     Performed at [facility]   Culture     Final   Value: GRAM POSITIVE COCCI IN PAIRS AND CHAINS     Note: Gram Stain Report Called to,Read Back By and Verified With: Drema Dallas 08/15/13 0815 BY [email or web address]     Performed at [facility]   Report Status PENDING   Incomplete  RAPID STREP SCREEN     Status: None   Collection Time    08/15/13 11:42 AM      Result Value Ref Range Status   Streptococcus, Group A Screen (Direct) NEGATIVE  NEGATIVE Final   Comment: (NOTE)     A Rapid Antigen test may result negative if the antigen level in the     sample is below the detection level of this test. The FDA has not     cleared this test as a stand-alone test therefore the rapid antigen     negative result has reflexed to a Group A Strep culture.    Assessment: 78 yo M who is from a nursing home. S/p recently cholecystectomy 2/6. Pt was on IV cipro/flagyl at NH  (planned to end 3/1). Antibiotic coverage broadened to Vanc and Zosyn Day#2 for intra-abd abscess. No sign of bile leak on HIDA. For abscess drainage in IR 2/27. Now pt with GPC in 2/2 blood cultures. Tm 102.8. Wbc 19 (slight trend down).   SCr stable, UOP ok.  2/26 Vanc>> 2/26 Zosyn>>  2/26 Bld x2>> 2/2 GPC (prs/chains) 2/26 Urine>> 2/27 GAS neg  Goal of Therapy:  Vancomycin trough level 15-20 mcg/ml  Plan:  Change Vancomycin to 500mg  IV q12h.  Zosyn 3.375g IV q8 F/u micro data, pt's clinical condition, trough at Css  Sherlon Handing, PharmD, BCPS Clinical pharmacist, pager [phone] 08/15/2013  1:54 PM

## 2013-08-15 NOTE — H&P (Signed)
Mike Mack is an 78 y.o. male.   Chief Complaint: pt has had lap chole surgery 07/25/13 returns to ER with N/V/Fever; abd pain CT reveals infected biloma arising from surgical bed; posterior Rt hepatic lobe abscess; L hepatic lobe abscess extending beneath abd wall Reviewed by Dr Laurence Ferrari; scheduled now for gallbladder fossa abscess drain placement and mid epigastric abscess drain placement  HPI: HTN; SBO; BPH; polio; DM; CVA  Past Medical History  Diagnosis Date  . Hypertension   . Small bowel obstruction   . BPH (benign prostatic hyperplasia)   . Neuropathy   . Polio   . Polio Childhood  . Constipation 05/13/2013  . Anemia 05/13/2013  . Type II diabetes mellitus   . Exertional shortness of breath     "sometimes" (07/25/2013)  . History of stomach ulcers   . Stroke 2014    residual:  "left hand kind of numb" (07/25/2013)  . Arthritis     "right leg" (07/25/2013)    Past Surgical History  Procedure Laterality Date  . Bowel resection    . Cataract extraction w/ intraocular lens  implant, bilateral Bilateral 2000's  . Colonoscopy      Hx: of  . Laparoscopic cholecystectomy  07/25/2013    w/LOA (07/25/2013)  . Excisional hemorrhoidectomy  2000's  . Colon surgery    . Inguinal hernia repair Right 1970's  . Cholecystectomy N/A 07/25/2013    Procedure: LAPAROSCOPIC CHOLECYSTECTOMY WITH INTRAOPERATIVE CHOLANGIOGRAM;  Surgeon: [name], MD;  [facility];  Service: General;  Laterality: N/A;    Family History  Problem Relation Age of Onset  . Diabetes Sister   . Diabetes Sister   . Diabetes Brother   . Hypertension Sister   . Hypertension Sister   . Hypertension Brother   . Heart failure Mother   . Heart attack Sister    Social History:  reports that he has never smoked. He has never used smokeless tobacco. He reports that he does not drink alcohol or use illicit drugs.  Allergies: No Known Allergies  Medications Prior to Admission  Medication Sig Dispense Refill   . Ciprofloxacin (CIPRO IV) Inject 400 mg into the vein 2 (two) times daily. Med to stop on 08/17/13      . docusate sodium 100 MG CAPS Take 100 mg by mouth 2 (two) times daily.  10 capsule  0  . gabapentin (NEURONTIN) 800 MG tablet Take 800 mg by mouth 3 (three) times daily as needed (HEADACHES).       Marland Kitchen glucagon (GLUCAGEN) 1 MG SOLR injection Inject 1 mg into the vein once as needed for low blood sugar.      . insulin aspart (NOVOLOG) 100 UNIT/ML injection Inject 5 Units into the skin 3 (three) times daily before meals.       . insulin glargine (LANTUS) 100 units/mL SOLN Inject 5 Units into the skin at bedtime.       . irbesartan (AVAPRO) 300 MG tablet Take 300 mg by mouth at bedtime.      . metroNIDAZOLE (FLAGYL) 5-0.79 MG/ML-% IVPB Inject 500 mg into the vein every 6 (six) hours.      Marland Kitchen nystatin (MYCOSTATIN) 100000 UNIT/ML suspension Take 5 mLs by mouth 2 (two) times daily. For 1 week, then re-access if need to continue, last dose due on 2/26      . polyethylene glycol (MIRALAX / GLYCOLAX) packet Take 17 g by mouth 3 (three) times daily.  14 each  0  .  predniSONE (DELTASONE) 5 MG tablet Take 5 mg by mouth daily with breakfast.      . Tamsulosin HCl (FLOMAX) 0.4 MG CAPS Take 0.4 mg by mouth 2 (two) times daily.       . Travoprost, BAK Free, (TRAVATAN) 0.004 % SOLN ophthalmic solution Place 1 drop into both eyes at bedtime.        Results for orders placed during the hospital encounter of 08/14/13 (from the past 48 hour(s))  CBC WITH DIFFERENTIAL     Status: Abnormal   Collection Time    08/14/13  1:45 PM      Result Value Ref Range   WBC 20.9 (*) 4.0 - 10.5 K/uL   RBC 3.67 (*) 4.22 - 5.81 MIL/uL   Hemoglobin 9.8 (*) 13.0 - 17.0 g/dL   HCT 30.0 (*) 39.0 - 52.0 %   MCV 81.7  78.0 - 100.0 fL   MCH 26.7  26.0 - 34.0 pg   MCHC 32.7  30.0 - 36.0 g/dL   RDW 17.1 (*) 11.5 - 15.5 %   Platelets 311  150 - 400 K/uL   Neutrophils Relative % 97 (*) 43 - 77 %   Neutro Abs 20.4 (*) 1.7 - 7.7 K/uL    Lymphocytes Relative 2 (*) 12 - 46 %   Lymphs Abs 0.4 (*) 0.7 - 4.0 K/uL   Monocytes Relative 1 (*) 3 - 12 %   Monocytes Absolute 0.2  0.1 - 1.0 K/uL   Eosinophils Relative 0  0 - 5 %   Eosinophils Absolute 0.0  0.0 - 0.7 K/uL   Basophils Relative 0  0 - 1 %   Basophils Absolute 0.0  0.0 - 0.1 K/uL  COMPREHENSIVE METABOLIC PANEL     Status: Abnormal   Collection Time    08/14/13  1:45 PM      Result Value Ref Range   Sodium 136 (*) 137 - 147 mEq/L   Potassium 5.2  3.7 - 5.3 mEq/L   Chloride 97  96 - 112 mEq/L   CO2 23  19 - 32 mEq/L   Glucose, Bld 290 (*) 70 - 99 mg/dL   BUN 31 (*) 6 - 23 mg/dL   Creatinine, Ser 1.06  0.50 - 1.35 mg/dL   Calcium 8.6  8.4 - 10.5 mg/dL   Total Protein 7.5  6.0 - 8.3 g/dL   Albumin 2.1 (*) 3.5 - 5.2 g/dL   AST 60 (*) 0 - 37 U/L   ALT 45  0 - 53 U/L   Alkaline Phosphatase 281 (*) 39 - 117 U/L   Total Bilirubin 0.6  0.3 - 1.2 mg/dL   GFR calc non Af Amer 67 (*) >90 mL/min   GFR calc Af Amer 77 (*) >90 mL/min   Comment: (NOTE)     The eGFR has been calculated using the CKD EPI equation.     This calculation has not been validated in all clinical situations.     eGFR's persistently <90 mL/min signify possible Chronic Kidney     Disease.  TROPONIN I     Status: None   Collection Time    08/14/13  1:45 PM      Result Value Ref Range   Troponin I <0.30  <0.30 ng/mL   Comment:            Due to the release kinetics of cTnI,     a negative result within the first hours     of the onset  of symptoms does not rule out     myocardial infarction with certainty.     If myocardial infarction is still suspected,     repeat the test at appropriate intervals.  I-STAT CG4 LACTIC ACID, ED     Status: Abnormal   Collection Time    08/14/13  2:09 PM      Result Value Ref Range   Lactic Acid, Venous 2.39 (*) 0.5 - 2.2 mmol/L  CULTURE, BLOOD (ROUTINE X 2)     Status: None   Collection Time    08/14/13  2:38 PM      Result Value Ref Range   Specimen  Description BLOOD ARM LEFT     Special Requests BOTTLES DRAWN AEROBIC AND ANAEROBIC 5CC     Culture  Setup Time       Value: 08/14/2013 20:43     Performed at [facility]   Culture       Value: [place] IN PAIRS AND CHAINS     Note: Gram Stain Report Called to,Read Back By and Verified With: Drema Dallas 08/15/12 0815 BY [email or web address]     Performed at [facility]   Report Status PENDING    CULTURE, BLOOD (ROUTINE X 2)     Status: None   Collection Time    08/14/13  2:52 PM      Result Value Ref Range   Specimen Description BLOOD RIGHT FOREARM     Special Requests BOTTLES DRAWN AEROBIC AND ANAEROBIC 5CC     Culture  Setup Time       Value: 08/14/2013 20:43     Performed at [facility]   Culture       Value: [place] IN PAIRS AND CHAINS     Note: Gram Stain Report Called to,Read Back By and Verified With: Drema Dallas 08/15/13 0815 BY [email or web address]     Performed at [facility]   Report Status PENDING    URINALYSIS, ROUTINE W REFLEX MICROSCOPIC     Status: Abnormal   Collection Time    08/14/13  6:11 PM      Result Value Ref Range   Color, Urine AMBER (*) YELLOW   Comment: BIOCHEMICALS MAY BE AFFECTED BY COLOR   APPearance CLEAR  CLEAR   Specific Gravity, Urine 1.015  1.005 - 1.030   pH 5.0  5.0 - 8.0   Glucose, UA 500 (*) NEGATIVE mg/dL   Hgb urine dipstick TRACE (*) NEGATIVE   Bilirubin Urine NEGATIVE  NEGATIVE   Ketones, ur 15 (*) NEGATIVE mg/dL   Protein, ur NEGATIVE  NEGATIVE mg/dL   Urobilinogen, UA 0.2  0.0 - 1.0 mg/dL   Nitrite NEGATIVE  NEGATIVE   Leukocytes, UA NEGATIVE  NEGATIVE  URINE MICROSCOPIC-ADD ON     Status: None   Collection Time    08/14/13  6:11 PM      Result Value Ref Range   Squamous Epithelial / LPF RARE  RARE   RBC / HPF 0-2  <3 RBC/hpf   Bacteria, UA RARE  RARE   Urine-Other AMORPHOUS URATES/PHOSPHATES    GLUCOSE, CAPILLARY     Status: Abnormal   Collection Time    08/14/13  8:35 PM       Result Value Ref Range   Glucose-Capillary 295 (*) 70 - 99 mg/dL   Comment 1 Notify RN     Comment 2 Documented in Chart    MAGNESIUM     Status: None  Collection Time    08/14/13 10:00 PM      Result Value Ref Range   Magnesium 2.1  1.5 - 2.5 mg/dL  PHOSPHORUS     Status: None   Collection Time    08/14/13 10:00 PM      Result Value Ref Range   Phosphorus 3.1  2.3 - 4.6 mg/dL  CBC     Status: Abnormal   Collection Time    08/15/13  2:33 AM      Result Value Ref Range   WBC 19.0 (*) 4.0 - 10.5 K/uL   RBC 3.21 (*) 4.22 - 5.81 MIL/uL   Hemoglobin 8.7 (*) 13.0 - 17.0 g/dL   HCT 26.0 (*) 39.0 - 52.0 %   MCV 81.0  78.0 - 100.0 fL   MCH 27.1  26.0 - 34.0 pg   MCHC 33.5  30.0 - 36.0 g/dL   RDW 17.0 (*) 11.5 - 15.5 %   Platelets 254  150 - 400 K/uL  COMPREHENSIVE METABOLIC PANEL     Status: Abnormal   Collection Time    08/15/13  2:33 AM      Result Value Ref Range   Sodium 140  137 - 147 mEq/L   Potassium 4.8  3.7 - 5.3 mEq/L   Chloride 106  96 - 112 mEq/L   Comment: DELTA CHECK NOTED   CO2 26  19 - 32 mEq/L   Glucose, Bld 238 (*) 70 - 99 mg/dL   BUN 29 (*) 6 - 23 mg/dL   Creatinine, Ser 1.04  0.50 - 1.35 mg/dL   Calcium 8.0 (*) 8.4 - 10.5 mg/dL   Total Protein 6.6  6.0 - 8.3 g/dL   Albumin 1.8 (*) 3.5 - 5.2 g/dL   AST 57 (*) 0 - 37 U/L   ALT 43  0 - 53 U/L   Alkaline Phosphatase 246 (*) 39 - 117 U/L   Total Bilirubin 0.4  0.3 - 1.2 mg/dL   GFR calc non Af Amer 68 (*) >90 mL/min   GFR calc Af Amer 79 (*) >90 mL/min   Comment: (NOTE)     The eGFR has been calculated using the CKD EPI equation.     This calculation has not been validated in all clinical situations.     eGFR's persistently <90 mL/min signify possible Chronic Kidney     Disease.   Dg Chest 2 View  08/14/2013   CLINICAL DATA:  Fever, postoperative state  EXAM: CHEST  2 VIEW  COMPARISON:  DG CHEST 2 VIEW dated 07/28/2013  FINDINGS: There is a moderate-sized pleural effusion layering posteriorly on the right.  A discrete alveolar infiltrate on the right is not demonstrated. The left lung is clear. The cardiac silhouette is normal in size. The pulmonary vascularity is not clearly engorged. The trachea is midline. The observed portions of the bony thorax exhibit no acute abnormality.  IMPRESSION: There is a moderate sized right-sided pleural effusion layering posteriorly.   Electronically Signed   By: David  Martinique   On: 08/14/2013 14:18   Nm Hepatobiliary Liver Func  08/15/2013   CLINICAL DATA:  Abdominal pain, post cholecystectomy.  EXAM: NUCLEAR MEDICINE HEPATOBILIARY IMAGING  TECHNIQUE: Sequential images of the abdomen were obtained out to 60 minutes following intravenous administration of radiopharmaceutical.  COMPARISON:  Abdominal CT 08/14/2013  RADIOPHARMACEUTICALS:  5 mCi Tc-25mCholetec  FINDINGS: The radiopharmaceutical was taken up by the liver and excreted into the biliary system. There are areas of photopenia involving  the central right hepatic lobe related to the known fluid or abscess collections. There is no evidence for a bile leak. Activity is identified in the small bowel.  IMPRESSION: No evidence for a bile leak.  Photopenia areas in the liver are most compatible with known fluid or abscess collections.   Electronically Signed   By: Markus Daft M.D.   On: 08/15/2013 09:34   Ct Abdomen Pelvis W Contrast  08/14/2013   CLINICAL DATA:  Fever, abdominal pain, status post lap cholecystectomy 1 week ago  EXAM: CT ABDOMEN AND PELVIS WITH CONTRAST  TECHNIQUE: Multidetector CT imaging of the abdomen and pelvis was performed using the standard protocol following bolus administration of intravenous contrast.  CONTRAST:  141m OMNIPAQUE IOHEXOL 300 MG/ML  SOLN  COMPARISON:  CT abdomen pelvis dated 05/13/2013  FINDINGS: Moderate right and trace left pleural effusions. Associated lower lobe atelectasis.  Status post cholecystectomy. Complex fluid collection/abscess arising from the surgical bed and extending  along the medial segment left hepatic lobe to the anterior hepatic dome (series 2/image 11). Collection is multiloculated and difficult to discretely measure, but is approximately 7.4 x 6.5 x 9.4 cm (series 2/ image 22; coronal image 32). Foci of gas within the collection, some of which is nondependent. This appearance is compatible with a postoperative abscess or possibly an infected biloma.  Adjacent 3.1 x 2.5 x 3.6 cm fluid collection/abscess in the posterior segment right hepatic lobe (series 2/image 26). Additional bilobed fluid collection inferior to the left hepatic lobe and beneath the anterior abdominal wall, measuring approximately 3.0 x 3.5 x 3.2 cm along its right aspect (series 2/ image 23) and 4.3 x 4.8 x 6.8 cm along its left aspect (series 2/image 29; coronal image 15).  Liver is otherwise unremarkable.  Spleen, pancreas, and adrenal glands are within normal limits.  Bilateral renal cysts, measuring up to 2.1 cm in the posterior right upper pole (series 2/ image 30) and 2.2 cm in the medial left lower pole (series 2/image 38). No hydronephrosis.  No evidence of bowel obstruction.  Normal appendix.  No evidence of abdominal aortic aneurysm.  No abdominopelvic ascites.  No suspicious abdominopelvic lymphadenopathy.  Prostate is notable for enlargement of the central gland which indents the base of the bladder.  Dystrophic calcifications just posterior to the right bladder (series 2/image 80). Bladder is otherwise unremarkable  IMPRESSION: Status post cholecystectomy.  9.4 cm multiloculated abscess/infected biloma arising from the surgical bed, extending along the medial segment left hepatic lobe, and overlying the left hepatic dome.  Adjacent 3.6 cm abscess in the posterior segment right hepatic lobe.  Adjacent bilobed abscess extending from the left hepatic lobe to beneath the anterior abdominal wall, measuring up to 6.5 cm in maximal dimension.  Foci of gas within the dominant collection.  No free  air.  These results will be called to the ordering clinician or representative by the Radiologist Assistant, and communication documented in the PACS Dashboard.   Electronically Signed   By: SJulian HyM.D.   On: 08/14/2013 17:08    Review of Systems  Constitutional: Positive for fever and weight loss.  Respiratory: Positive for shortness of breath.   Cardiovascular: Positive for chest pain.  Gastrointestinal: Positive for nausea, vomiting and abdominal pain.  Neurological: Positive for weakness.    Blood pressure 114/54, pulse 112, temperature 99.2 F (37.3 C), temperature source Oral, resp. rate 16, height 5' 5"  (1.651 m), weight 61.1 kg (134 lb 11.2 oz), SpO2 91.00%. Physical Exam  Constitutional:  Thin; groggy  Cardiovascular: Normal rate and regular rhythm.   No murmur heard. Respiratory: Effort normal. He has wheezes.  GI: Soft. There is tenderness.  Musculoskeletal: Normal range of motion.  Neurological:  Groggy; sl confused  Skin: Skin is dry.  Psychiatric:  Wife signed consent in room     Assessment/Plan Post Lap Chole 2/6 Now with N/V/Fever/ hi wbc CT reveals biloma and mid epi abscess Scheduled for drains to be placed today pts wife aware of procedure benefits and risks Agreeable to proceed Consent signed and in chart  [place] A 08/15/2013, 10:22 AM

## 2013-08-15 NOTE — Procedures (Signed)
Interventional Radiology Procedure Note  Procedure: 1.) 49F drain placed in GB fossa abscess in the RUQ.  200 mL purulent brown bilious fluid aspirated.  Drain to JP bulb suction. 2.) 48F drain placed in anterior perigastric abscess in MEG.  70 mL thick purulent fluid aspirated.  Cavity lavaged.  JP bulb suction.  Complications: None Recommendations: - Maintain to JP bulb suction.   - Recommend repeat CT scan prior to drain removal.  Signed,  Criselda Peaches, MD [facility]

## 2013-08-15 NOTE — Progress Notes (Signed)
Paged [place] admissions. Who has pt today? Or how do I page Dr. Garwin Brothers?    Physician notified: Rizwan At: 0823  Regarding: FYI 3/4 blood cultures, 2 aerobic 1 anaerobic gram positive cocci in chains.  Awaiting return response.   Orders placed in [place]

## 2013-08-15 NOTE — Progress Notes (Signed)
Subjective: Pt just got back from HIDA scan.  Pain well controlled, still nauseated.  No vomiting.  NPO.  Urinating well.  Objective: Vital signs in last 24 hours: Temp:  [98.8 F (37.1 C)-102.8 F (39.3 C)] 99.3 F (37.4 C) (02/27 0400) Pulse Rate:  [90-155] 112 (02/27 0346) Resp:  [14-22] 16 (02/27 0346) BP: (110-161)/(54-78) 114/54 mmHg (02/27 0346) SpO2:  [86 %-100 %] 91 % (02/27 0346) Weight:  [131 lb 13.4 oz (59.8 kg)-134 lb 11.2 oz (61.1 kg)] 134 lb 11.2 oz (61.1 kg) (02/27 0500)    Intake/Output from previous day: 02/26 0701 - 02/27 0700 In: 350 [I.V.:300; IV Piggyback:50] Out: 700 [Urine:700] Intake/Output this shift:    PE: Gen:  Alert, NAD, pleasant Card:  Regular rhythm, tachycardic, no M/G/R heard Pulm:  CTA, no W/R/R Abd: Soft, moderate tenderness in the epigastrium and RUQ, +BS, no HSM, incisions C/D/I, midline abdominal scar noted Ext:  No erythema, edema, or tenderness    Lab Results:   Recent Labs  08/14/13 1345 08/15/13 0233  WBC 20.9* 19.0*  HGB 9.8* 8.7*  HCT 30.0* 26.0*  PLT 311 254   BMET  Recent Labs  08/14/13 1345 08/15/13 0233  NA 136* 140  K 5.2 4.8  CL 97 106  CO2 23 26  GLUCOSE 290* 238*  BUN 31* 29*  CREATININE 1.06 1.04  CALCIUM 8.6 8.0*   PT/INR No results found for this basename: LABPROT, INR,  in the last 72 hours CMP     Component Value Date/Time   NA 140 08/15/2013 0233   K 4.8 08/15/2013 0233   CL 106 08/15/2013 0233   CO2 26 08/15/2013 0233   GLUCOSE 238* 08/15/2013 0233   BUN 29* 08/15/2013 0233   CREATININE 1.04 08/15/2013 0233   CALCIUM 8.0* 08/15/2013 0233   PROT 6.6 08/15/2013 0233   ALBUMIN 1.8* 08/15/2013 0233   AST 57* 08/15/2013 0233   ALT 43 08/15/2013 0233   ALKPHOS 246* 08/15/2013 0233   BILITOT 0.4 08/15/2013 0233   GFRNONAA 68* 08/15/2013 0233   GFRAA 79* 08/15/2013 0233   Lipase     Component Value Date/Time   LIPASE 11 07/28/2013 0915       Studies/Results: Dg Chest 2 View  08/14/2013    CLINICAL DATA:  Fever, postoperative state  EXAM: CHEST  2 VIEW  COMPARISON:  DG CHEST 2 VIEW dated 07/28/2013  FINDINGS: There is a moderate-sized pleural effusion layering posteriorly on the right. A discrete alveolar infiltrate on the right is not demonstrated. The left lung is clear. The cardiac silhouette is normal in size. The pulmonary vascularity is not clearly engorged. The trachea is midline. The observed portions of the bony thorax exhibit no acute abnormality.  IMPRESSION: There is a moderate sized right-sided pleural effusion layering posteriorly.   Electronically Signed   By: David  Martinique   On: 08/14/2013 14:18   Ct Abdomen Pelvis W Contrast  08/14/2013   CLINICAL DATA:  Fever, abdominal pain, status post lap cholecystectomy 1 week ago  EXAM: CT ABDOMEN AND PELVIS WITH CONTRAST  TECHNIQUE: Multidetector CT imaging of the abdomen and pelvis was performed using the standard protocol following bolus administration of intravenous contrast.  CONTRAST:  160mL OMNIPAQUE IOHEXOL 300 MG/ML  SOLN  COMPARISON:  CT abdomen pelvis dated 05/13/2013  FINDINGS: Moderate right and trace left pleural effusions. Associated lower lobe atelectasis.  Status post cholecystectomy. Complex fluid collection/abscess arising from the surgical bed and extending along the medial segment left hepatic  lobe to the anterior hepatic dome (series 2/image 11). Collection is multiloculated and difficult to discretely measure, but is approximately 7.4 x 6.5 x 9.4 cm (series 2/ image 22; coronal image 32). Foci of gas within the collection, some of which is nondependent. This appearance is compatible with a postoperative abscess or possibly an infected biloma.  Adjacent 3.1 x 2.5 x 3.6 cm fluid collection/abscess in the posterior segment right hepatic lobe (series 2/image 26). Additional bilobed fluid collection inferior to the left hepatic lobe and beneath the anterior abdominal wall, measuring approximately 3.0 x 3.5 x 3.2 cm along its  right aspect (series 2/ image 23) and 4.3 x 4.8 x 6.8 cm along its left aspect (series 2/image 29; coronal image 15).  Liver is otherwise unremarkable.  Spleen, pancreas, and adrenal glands are within normal limits.  Bilateral renal cysts, measuring up to 2.1 cm in the posterior right upper pole (series 2/ image 30) and 2.2 cm in the medial left lower pole (series 2/image 38). No hydronephrosis.  No evidence of bowel obstruction.  Normal appendix.  No evidence of abdominal aortic aneurysm.  No abdominopelvic ascites.  No suspicious abdominopelvic lymphadenopathy.  Prostate is notable for enlargement of the central gland which indents the base of the bladder.  Dystrophic calcifications just posterior to the right bladder (series 2/image 80). Bladder is otherwise unremarkable  IMPRESSION: Status post cholecystectomy.  9.4 cm multiloculated abscess/infected biloma arising from the surgical bed, extending along the medial segment left hepatic lobe, and overlying the left hepatic dome.  Adjacent 3.6 cm abscess in the posterior segment right hepatic lobe.  Adjacent bilobed abscess extending from the left hepatic lobe to beneath the anterior abdominal wall, measuring up to 6.5 cm in maximal dimension.  Foci of gas within the dominant collection.  No free air.  These results will be called to the ordering clinician or representative by the Radiologist Assistant, and communication documented in the PACS Dashboard.   Electronically Signed   By: Julian Hy M.D.   On: 08/14/2013 17:08    Anti-infectives: Anti-infectives   Start     Dose/Rate Route Frequency Ordered Stop   08/15/13 1200  vancomycin (VANCOCIN) 1,250 mg in sodium chloride 0.9 % 250 mL IVPB     1,250 mg 166.7 mL/hr over 90 Minutes Intravenous Every 24 hours 08/14/13 1911     08/15/13 0000  piperacillin-tazobactam (ZOSYN) IVPB 3.375 g     3.375 g 12.5 mL/hr over 240 Minutes Intravenous Every 8 hours 08/14/13 1911     08/14/13 1530  vancomycin  (VANCOCIN) IVPB 1000 mg/200 mL premix     1,000 mg 200 mL/hr over 60 Minutes Intravenous  Once 08/14/13 1526 08/14/13 1823   08/14/13 1530  piperacillin-tazobactam (ZOSYN) IVPB 3.375 g     3.375 g 100 mL/hr over 30 Minutes Intravenous  Once 08/14/13 1526 08/14/13 1603       Assessment/Plan POD #21 S/P Laparoscopic cholecystectomy by [name] on 07/25/13 for chronic cholecystitis and cholelithiasis Abscesses in the gallbladder fossa Hepatic abscess Epigastric fluid collection Leukocytosis - improved to 19.0 today Anemia - Hgb 8.7 Hyperglycemia  Fevers   Plan: 1.  HIDA scan negative for leak 2.  IR to attempt drainage/drain placement in intraabdominal abscesses 3.  NPO, IVF, pain control, antiemetics 4.  Ambulate and IS 5.  Continue antibiotics (Zosyn Day 2, Vancomycin Day 2), blood cultures positive for gram + cocci in pairs/chains, sensitivities pending 6.  Hopefully will resolve with conservative management  LOS: 1 day    DORT, Janelli Welling 08/15/2013, 8:16 AM Pager: [phone]

## 2013-08-15 NOTE — Progress Notes (Signed)
Utilization review completed.  

## 2013-08-15 NOTE — Progress Notes (Signed)
No sign of bile leak on HIDA IR to drain abscesses today. Continue IV abx.  Mike Mack. Georgette Dover, MD, [facility] Surgery  General/ Trauma Surgery  08/15/2013 11:36 AM

## 2013-08-15 NOTE — Consult Note (Addendum)
WOC wound consult note Reason for Consult: Consult requested for heel wound.  Wife at bedside states this "started at the rehab center." Wound type: Deep tissue injury Pressure Ulcer POA: Yes Measurement:1X3cm Wound bed: dark purple Drainage (amount, consistency, odor) no open wound or drainage Periwound: intact skin surrounding Dressing procedure/placement/frequency: Float right heel in Prevalon boot to reduce pressure when in bed.  Discussed wound etiology, possible evolution to full thickness wound, and treatment to reduce pressure.  She appears to understand. Foot is warm with palpable pulse. Please re-consult if further assistance is needed.  Thank-you,  Julien Girt MSN, [place], [place], [place], [place]

## 2013-08-16 DIAGNOSIS — I369 Nonrheumatic tricuspid valve disorder, unspecified: Secondary | ICD-10-CM

## 2013-08-16 LAB — CBC
HEMATOCRIT: 22 % — AB (ref 39.0–52.0)
Hemoglobin: 7.3 g/dL — ABNORMAL LOW (ref 13.0–17.0)
MCH: 26.7 pg (ref 26.0–34.0)
MCHC: 33.2 g/dL (ref 30.0–36.0)
MCV: 80.6 fL (ref 78.0–100.0)
Platelets: 180 10*3/uL (ref 150–400)
RBC: 2.73 MIL/uL — ABNORMAL LOW (ref 4.22–5.81)
RDW: 17.2 % — AB (ref 11.5–15.5)
WBC: 11.8 10*3/uL — ABNORMAL HIGH (ref 4.0–10.5)

## 2013-08-16 LAB — URINE CULTURE
Colony Count: NO GROWTH
Culture: NO GROWTH

## 2013-08-16 LAB — IRON AND TIBC
Iron: <10 ug/dL — ABNORMAL LOW (ref 42–135)
UIBC: 107 ug/dL — AB (ref 125–400)

## 2013-08-16 LAB — RETICULOCYTES
RBC.: 2.73 MIL/uL — AB (ref 4.22–5.81)
RETIC COUNT ABSOLUTE: 38.2 10*3/uL (ref 19.0–186.0)
Retic Ct Pct: 1.4 % (ref 0.4–3.1)

## 2013-08-16 LAB — GLUCOSE, CAPILLARY
GLUCOSE-CAPILLARY: 116 mg/dL — AB (ref 70–99)
GLUCOSE-CAPILLARY: 110 mg/dL — AB (ref 70–99)
Glucose-Capillary: 130 mg/dL — ABNORMAL HIGH (ref 70–99)
Glucose-Capillary: 147 mg/dL — ABNORMAL HIGH (ref 70–99)

## 2013-08-16 LAB — PROTIME-INR
INR: 1.61 — ABNORMAL HIGH (ref 0.00–1.49)
PROTHROMBIN TIME: 18.7 s — AB (ref 11.6–15.2)

## 2013-08-16 LAB — PREPARE FRESH FROZEN PLASMA: UNIT DIVISION: 0

## 2013-08-16 LAB — VITAMIN B12: Vitamin B-12: 1375 pg/mL — ABNORMAL HIGH (ref 211–911)

## 2013-08-16 LAB — FOLATE: Folate: 8.5 ng/mL

## 2013-08-16 LAB — FERRITIN: FERRITIN: 345 ng/mL — AB (ref 22–322)

## 2013-08-16 MED ORDER — VANCOMYCIN HCL 500 MG IV SOLR
500.0000 mg | Freq: Two times a day (BID) | INTRAVENOUS | Status: DC
  Administered 2013-08-16 – 2013-08-17 (×4): 500 mg via INTRAVENOUS
  Filled 2013-08-16 (×6): qty 500

## 2013-08-16 NOTE — Consult Note (Signed)
[facility]  Total days of antibiotics 3        Day 3 piptazo        Day 3 vanco               Reason for Consult: enterococcal bacteremia originating from perihepatic abscess    Referring Physician: rizwan  Principal Problem:   Severe sepsis Active Problems:   Diabetes mellitus without complication   Hypertension   Stroke   Anemia   Right upper quadrant abdominal abscess   Hepatic abscess    HPI: Mike Mack is a 78 y.o. male with hx of stroke, childhood polio, HTN, DM who underwent lap cholecystectomy on 07/25/2012 and started to notice having increasing abdominal pain, with fever, chills, poor po intake. On admit, found to have leukocytosis of 19K, temp of 102.9. Underwent abd Ct which showed complex fluid collection at gallbladder bed, multiloculated with 9.4 cm infected biloma vs. Abscess, as well as adjacent fluid collection near left helaptic laob at 6.5cm and 3.6cm posterior segment of RHL. He underwent IR guided drains placement on 2/27 as well as starting on empiric vancomycin and piptazo. Admission blood cx now have identified enterococcus in 2 sets. Abscess fluid cultures still pending. He underwent hida scan that did not show evidence of bile leak. Since starting antibiotics and drainage of fluid collections, wbc trending down as well as fever curve.  Past Medical History  Diagnosis Date  . Hypertension   . Small bowel obstruction   . BPH (benign prostatic hyperplasia)   . Neuropathy   . Polio   . Polio Childhood  . Constipation 05/13/2013  . Anemia 05/13/2013  . Type II diabetes mellitus   . Exertional shortness of breath     "sometimes" (07/25/2013)  . History of stomach ulcers   . Stroke 2014    residual:  "left hand kind of numb" (07/25/2013)  . Arthritis     "right leg" (07/25/2013)    Allergies: No Known Allergies    MEDICATIONS: . docusate sodium  100 mg Oral BID  . enoxaparin (LOVENOX) injection  40 mg Subcutaneous Q24H    . insulin aspart  0-15 Units Subcutaneous TID WC  . insulin aspart  0-5 Units Subcutaneous QHS  . insulin glargine  5 Units Subcutaneous QHS  . latanoprost  1 drop Both Eyes QHS  . nystatin  5 mL Oral BID  . piperacillin-tazobactam (ZOSYN)  IV  3.375 g Intravenous Q8H  . polyethylene glycol  17 g Oral TID  . predniSONE  5 mg Oral Q breakfast  . tamsulosin  0.4 mg Oral BID  . vancomycin  500 mg Intravenous Q12H    History  Substance Use Topics  . Smoking status: Never Smoker   . Smokeless tobacco: Never Used  . Alcohol Use: No    Family History  Problem Relation Age of Onset  . Diabetes Sister   . Diabetes Sister   . Diabetes Brother   . Hypertension Sister   . Hypertension Sister   . Hypertension Brother   . Heart failure Mother   . Heart attack Sister     Review of Systems  Constitutional: Negative for fever, chills, diaphoresis, activity change, appetite change, fatigue and unexpected weight change.  HENT: Negative for congestion, sore throat, rhinorrhea, sneezing, trouble swallowing and sinus pressure.  Eyes: Negative for photophobia and visual disturbance.  Respiratory: Negative for cough, chest tightness, shortness of breath, wheezing and stridor.  Cardiovascular: Negative for  chest pain, palpitations and leg swelling.  Gastrointestinal: abd pain +, decreased appetite. Genitourinary: Negative for dysuria, hematuria, flank pain and difficulty urinating.  Musculoskeletal: Negative for myalgias, back pain, joint swelling, arthralgias and gait problem.  Skin: Negative for color change, pallor, rash and wound.  Neurological: Negative for dizziness, tremors, weakness and light-headedness.  Hematological: Negative for adenopathy. Does not bruise/bleed easily.  Psychiatric/Behavioral: Negative for behavioral problems, confusion, sleep disturbance, dysphoric mood, decreased concentration and agitation.      OBJECTIVE: Temp:  [98.4 F (36.9 C)-100.4 F (38 C)] 98.6 F  (37 C) (02/28 1122) Pulse Rate:  [88-121] 88 (02/28 0800) Resp:  [12-24] 12 (02/28 0800) BP: (110-160)/(54-89) 142/75 mmHg (02/28 0800) SpO2:  [97 %-100 %] 100 % (02/28 0800) Weight:  [135 lb 2.3 oz (61.3 kg)] 135 lb 2.3 oz (61.3 kg) (02/28 0400)  General: No acute respiratory distress, elderly male, chronically ill appearing Lungs: Clear to auscultation bilaterally without wheezes or crackles  Cardiovascular: Regular rate and rhythm without murmur gallop or rub normal S1 and S2 Abdomen: RUQ drain with blood tinged fluid- other drain with pus, nondistended, soft, bowel sounds positive, no rebound, no ascites, no appreciable mass  Extremities: No significant cyanosis, clubbing, or edema bilateral lower extremities, wearing soft boots  LABS: Results for orders placed during the hospital encounter of 08/14/13 (from the past 48 hour(s))  CBC WITH DIFFERENTIAL     Status: Abnormal   Collection Time    08/14/13  1:45 PM      Result Value Ref Range   WBC 20.9 (*) 4.0 - 10.5 K/uL   RBC 3.67 (*) 4.22 - 5.81 MIL/uL   Hemoglobin 9.8 (*) 13.0 - 17.0 g/dL   HCT 30.0 (*) 39.0 - 52.0 %   MCV 81.7  78.0 - 100.0 fL   MCH 26.7  26.0 - 34.0 pg   MCHC 32.7  30.0 - 36.0 g/dL   RDW 17.1 (*) 11.5 - 15.5 %   Platelets 311  150 - 400 K/uL   Neutrophils Relative % 97 (*) 43 - 77 %   Neutro Abs 20.4 (*) 1.7 - 7.7 K/uL   Lymphocytes Relative 2 (*) 12 - 46 %   Lymphs Abs 0.4 (*) 0.7 - 4.0 K/uL   Monocytes Relative 1 (*) 3 - 12 %   Monocytes Absolute 0.2  0.1 - 1.0 K/uL   Eosinophils Relative 0  0 - 5 %   Eosinophils Absolute 0.0  0.0 - 0.7 K/uL   Basophils Relative 0  0 - 1 %   Basophils Absolute 0.0  0.0 - 0.1 K/uL  COMPREHENSIVE METABOLIC PANEL     Status: Abnormal   Collection Time    08/14/13  1:45 PM      Result Value Ref Range   Sodium 136 (*) 137 - 147 mEq/L   Potassium 5.2  3.7 - 5.3 mEq/L   Chloride 97  96 - 112 mEq/L   CO2 23  19 - 32 mEq/L   Glucose, Bld 290 (*) 70 - 99 mg/dL   BUN 31  (*) 6 - 23 mg/dL   Creatinine, Ser 1.06  0.50 - 1.35 mg/dL   Calcium 8.6  8.4 - 10.5 mg/dL   Total Protein 7.5  6.0 - 8.3 g/dL   Albumin 2.1 (*) 3.5 - 5.2 g/dL   AST 60 (*) 0 - 37 U/L   ALT 45  0 - 53 U/L   Alkaline Phosphatase 281 (*) 39 - 117 U/L  Total Bilirubin 0.6  0.3 - 1.2 mg/dL   GFR calc non Af Amer 67 (*) >90 mL/min   GFR calc Af Amer 77 (*) >90 mL/min   Comment: (NOTE)     The eGFR has been calculated using the CKD EPI equation.     This calculation has not been validated in all clinical situations.     eGFR's persistently <90 mL/min signify possible Chronic Kidney     Disease.  TROPONIN I     Status: None   Collection Time    08/14/13  1:45 PM      Result Value Ref Range   Troponin I <0.30  <0.30 ng/mL   Comment:            Due to the release kinetics of cTnI,     a negative result within the first hours     of the onset of symptoms does not rule out     myocardial infarction with certainty.     If myocardial infarction is still suspected,     repeat the test at appropriate intervals.  I-STAT CG4 LACTIC ACID, ED     Status: Abnormal   Collection Time    08/14/13  2:09 PM      Result Value Ref Range   Lactic Acid, Venous 2.39 (*) 0.5 - 2.2 mmol/L  CULTURE, BLOOD (ROUTINE X 2)     Status: None   Collection Time    08/14/13  2:38 PM      Result Value Ref Range   Specimen Description BLOOD ARM LEFT     Special Requests BOTTLES DRAWN AEROBIC AND ANAEROBIC 5CC     Culture  Setup Time       Value: 08/14/2013 20:43     Performed at [facility]   Culture       Value: ENTEROCOCCUS SPECIES     Note: Gram Stain Report Called to,Read Back By and Verified With: Drema Dallas 08/15/12 0815 BY SMITHERSJ     Performed at [facility]   Report Status PENDING    CULTURE, BLOOD (ROUTINE X 2)     Status: None   Collection Time    08/14/13  2:52 PM      Result Value Ref Range   Specimen Description BLOOD RIGHT FOREARM     Special Requests BOTTLES DRAWN  AEROBIC AND ANAEROBIC 5CC     Culture  Setup Time       Value: 08/14/2013 20:43     Performed at [facility]   Culture       Value: ENTEROCOCCUS SPECIES     Note: Gram Stain Report Called to,Read Back By and Verified With: Drema Dallas 08/15/13 0815 BY SMITHERSJ     Performed at [facility]   Report Status PENDING    URINALYSIS, ROUTINE W REFLEX MICROSCOPIC     Status: Abnormal   Collection Time    08/14/13  6:11 PM      Result Value Ref Range   Color, Urine AMBER (*) YELLOW   Comment: BIOCHEMICALS MAY BE AFFECTED BY COLOR   APPearance CLEAR  CLEAR   Specific Gravity, Urine 1.015  1.005 - 1.030   pH 5.0  5.0 - 8.0   Glucose, UA 500 (*) NEGATIVE mg/dL   Hgb urine dipstick TRACE (*) NEGATIVE   Bilirubin Urine NEGATIVE  NEGATIVE   Ketones, ur 15 (*) NEGATIVE mg/dL   Protein, ur NEGATIVE  NEGATIVE mg/dL   Urobilinogen, UA 0.2  0.0 - 1.0 mg/dL   Nitrite NEGATIVE  NEGATIVE   Leukocytes, UA NEGATIVE  NEGATIVE  URINE MICROSCOPIC-ADD ON     Status: None   Collection Time    08/14/13  6:11 PM      Result Value Ref Range   Squamous Epithelial / LPF RARE  RARE   RBC / HPF 0-2  <3 RBC/hpf   Bacteria, UA RARE  RARE   Urine-Other AMORPHOUS URATES/PHOSPHATES    GLUCOSE, CAPILLARY     Status: Abnormal   Collection Time    08/14/13  8:35 PM      Result Value Ref Range   Glucose-Capillary 295 (*) 70 - 99 mg/dL   Comment 1 Notify RN     Comment 2 Documented in Chart    MAGNESIUM     Status: None   Collection Time    08/14/13 10:00 PM      Result Value Ref Range   Magnesium 2.1  1.5 - 2.5 mg/dL  PHOSPHORUS     Status: None   Collection Time    08/14/13 10:00 PM      Result Value Ref Range   Phosphorus 3.1  2.3 - 4.6 mg/dL  TSH     Status: None   Collection Time    08/14/13 10:00 PM      Result Value Ref Range   TSH 2.300  0.350 - 4.500 uIU/mL   Comment: Performed at [place] [facility]     Status: Abnormal   Collection Time    08/15/13  2:33  AM      Result Value Ref Range   Hemoglobin A1C 7.3 (*) <5.7 %   Comment: (NOTE)                                                                               According to the ADA Clinical Practice Recommendations for 2011, when     HbA1c is used as a screening test:      >=6.5%   Diagnostic of Diabetes Mellitus               (if abnormal result is confirmed)     5.7-6.4%   Increased risk of developing Diabetes Mellitus     References:Diagnosis and Classification of Diabetes Mellitus,Diabetes     EXNT,7001,74(BSWHQ 1):S62-S69 and Standards of Medical Care in             Diabetes - 2011,Diabetes Care,2011,34 (Suppl 1):S11-S61.   Mean Plasma Glucose 163 (*) <117 mg/dL   Comment: Performed at [facility]  CBC     Status: Abnormal   Collection Time    08/15/13  2:33 AM      Result Value Ref Range   WBC 19.0 (*) 4.0 - 10.5 K/uL   RBC 3.21 (*) 4.22 - 5.81 MIL/uL   Hemoglobin 8.7 (*) 13.0 - 17.0 g/dL   HCT 26.0 (*) 39.0 - 52.0 %   MCV 81.0  78.0 - 100.0 fL   MCH 27.1  26.0 - 34.0 pg   MCHC 33.5  30.0 - 36.0 g/dL   RDW 17.0 (*) 11.5 - 15.5 %   Platelets 254  150 - 400  K/uL  COMPREHENSIVE METABOLIC PANEL     Status: Abnormal   Collection Time    08/15/13  2:33 AM      Result Value Ref Range   Sodium 140  137 - 147 mEq/L   Potassium 4.8  3.7 - 5.3 mEq/L   Chloride 106  96 - 112 mEq/L   Comment: DELTA CHECK NOTED   CO2 26  19 - 32 mEq/L   Glucose, Bld 238 (*) 70 - 99 mg/dL   BUN 29 (*) 6 - 23 mg/dL   Creatinine, Ser 1.04  0.50 - 1.35 mg/dL   Calcium 8.0 (*) 8.4 - 10.5 mg/dL   Total Protein 6.6  6.0 - 8.3 g/dL   Albumin 1.8 (*) 3.5 - 5.2 g/dL   AST 57 (*) 0 - 37 U/L   ALT 43  0 - 53 U/L   Alkaline Phosphatase 246 (*) 39 - 117 U/L   Total Bilirubin 0.4  0.3 - 1.2 mg/dL   GFR calc non Af Amer 68 (*) >90 mL/min   GFR calc Af Amer 79 (*) >90 mL/min   Comment: (NOTE)     The eGFR has been calculated using the CKD EPI equation.     This calculation has not been validated in  all clinical situations.     eGFR's persistently <90 mL/min signify possible Chronic Kidney     Disease.  GLUCOSE, CAPILLARY     Status: Abnormal   Collection Time    08/15/13  9:29 AM      Result Value Ref Range   Glucose-Capillary 268 (*) 70 - 99 mg/dL  RAPID STREP SCREEN     Status: None   Collection Time    08/15/13 11:42 AM      Result Value Ref Range   Streptococcus, Group A Screen (Direct) NEGATIVE  NEGATIVE   Comment: (NOTE)     A Rapid Antigen test may result negative if the antigen level in the     sample is below the detection level of this test. The FDA has not     cleared this test as a stand-alone test therefore the rapid antigen     negative result has reflexed to a Group A Strep culture.  GLUCOSE, CAPILLARY     Status: Abnormal   Collection Time    08/15/13 11:48 AM      Result Value Ref Range   Glucose-Capillary 218 (*) 70 - 99 mg/dL   Comment 1 Notify RN     Comment 2 Documented in Chart    PROTIME-INR     Status: Abnormal   Collection Time    08/15/13 12:30 PM      Result Value Ref Range   Prothrombin Time 19.5 (*) 11.6 - 15.2 seconds   INR 1.70 (*) 0.00 - 1.49  APTT     Status: None   Collection Time    08/15/13 12:30 PM      Result Value Ref Range   aPTT 33  24 - 37 seconds  PREPARE FRESH FROZEN PLASMA     Status: None   Collection Time    08/15/13  1:56 PM      Result Value Ref Range   Unit Number [id number]     Blood Component Type THAWED PLASMA     Unit division 00     Status of Unit ISSUED     Transfusion Status OK TO TRANSFUSE    GLUCOSE, CAPILLARY     Status: Abnormal  Collection Time    08/15/13  6:35 PM      Result Value Ref Range   Glucose-Capillary 204 (*) 70 - 99 mg/dL  TYPE AND SCREEN     Status: None   Collection Time    08/15/13  6:36 PM      Result Value Ref Range   ABO/RH(D) A POS     Antibody Screen NEG     Sample Expiration 83/33/8329    BASIC METABOLIC PANEL     Status: Abnormal   Collection Time    08/15/13  6:36  PM      Result Value Ref Range   Sodium 144  137 - 147 mEq/L   Potassium 4.2  3.7 - 5.3 mEq/L   Chloride 107  96 - 112 mEq/L   CO2 26  19 - 32 mEq/L   Glucose, Bld 218 (*) 70 - 99 mg/dL   BUN 31 (*) 6 - 23 mg/dL   Creatinine, Ser 1.18  0.50 - 1.35 mg/dL   Calcium 8.1 (*) 8.4 - 10.5 mg/dL   GFR calc non Af Amer 59 (*) >90 mL/min   GFR calc Af Amer 68 (*) >90 mL/min   Comment: (NOTE)     The eGFR has been calculated using the CKD EPI equation.     This calculation has not been validated in all clinical situations.     eGFR's persistently <90 mL/min signify possible Chronic Kidney     Disease.  CULTURE, ROUTINE-ABSCESS     Status: None   Collection Time    08/15/13  6:52 PM      Result Value Ref Range   Specimen Description ABSCESS     Special Requests       Value: MID EPIGASTRUM IN THE ANTERIOR PERI GASTRIC SAMPLE NO 2   Gram Stain PENDING     Culture PENDING     Report Status PENDING    GLUCOSE, CAPILLARY     Status: Abnormal   Collection Time    08/15/13 10:47 PM      Result Value Ref Range   Glucose-Capillary 162 (*) 70 - 99 mg/dL   Comment 1 Documented in Chart     Comment 2 Notify RN    RETICULOCYTES     Status: Abnormal   Collection Time    08/16/13  2:45 AM      Result Value Ref Range   Retic Ct Pct 1.4  0.4 - 3.1 %   RBC. 2.73 (*) 4.22 - 5.81 MIL/uL   Retic Count, Manual 38.2  19.0 - 186.0 K/uL  CBC     Status: Abnormal   Collection Time    08/16/13  2:45 AM      Result Value Ref Range   WBC 11.8 (*) 4.0 - 10.5 K/uL   RBC 2.73 (*) 4.22 - 5.81 MIL/uL   Hemoglobin 7.3 (*) 13.0 - 17.0 g/dL   HCT 22.0 (*) 39.0 - 52.0 %   MCV 80.6  78.0 - 100.0 fL   MCH 26.7  26.0 - 34.0 pg   MCHC 33.2  30.0 - 36.0 g/dL   RDW 17.2 (*) 11.5 - 15.5 %   Platelets 180  150 - 400 K/uL   Comment: REPEATED TO VERIFY     DELTA CHECK NOTED  GLUCOSE, CAPILLARY     Status: Abnormal   Collection Time    08/16/13  7:50 AM      Result Value Ref Range   Glucose-Capillary 116 (*) 70 -  99  mg/dL   Comment 1 Notify RN     Comment 2 Documented in Chart    PROTIME-INR     Status: Abnormal   Collection Time    08/16/13 10:05 AM      Result Value Ref Range   Prothrombin Time 18.7 (*) 11.6 - 15.2 seconds   INR 1.61 (*) 0.00 - 1.49    MICRO: 2/27 fluid cx pending 2/26 blood cx x 2 enterococcus  IMAGING: Dg Chest 2 View  08/14/2013   CLINICAL DATA:  Fever, postoperative state  EXAM: CHEST  2 VIEW  COMPARISON:  DG CHEST 2 VIEW dated 07/28/2013  FINDINGS: There is a moderate-sized pleural effusion layering posteriorly on the right. A discrete alveolar infiltrate on the right is not demonstrated. The left lung is clear. The cardiac silhouette is normal in size. The pulmonary vascularity is not clearly engorged. The trachea is midline. The observed portions of the bony thorax exhibit no acute abnormality.  IMPRESSION: There is a moderate sized right-sided pleural effusion layering posteriorly.   Electronically Signed   By: David  Martinique   On: 08/14/2013 14:18   Nm Hepatobiliary Liver Func  08/15/2013   CLINICAL DATA:  Abdominal pain, post cholecystectomy.  EXAM: NUCLEAR MEDICINE HEPATOBILIARY IMAGING  TECHNIQUE: Sequential images of the abdomen were obtained out to 60 minutes following intravenous administration of radiopharmaceutical.  COMPARISON:  Abdominal CT 08/14/2013  RADIOPHARMACEUTICALS:  5 mCi Tc-49mCholetec  FINDINGS: The radiopharmaceutical was taken up by the liver and excreted into the biliary system. There are areas of photopenia involving the central right hepatic lobe related to the known fluid or abscess collections. There is no evidence for a bile leak. Activity is identified in the small bowel.  IMPRESSION: No evidence for a bile leak.  Photopenia areas in the liver are most compatible with known fluid or abscess collections.   Electronically Signed   By: AMarkus DaftM.D.   On: 08/15/2013 09:34   Ct Abdomen Pelvis W Contrast  08/14/2013   CLINICAL DATA:  Fever, abdominal  pain, status post lap cholecystectomy 1 week ago  EXAM: CT ABDOMEN AND PELVIS WITH CONTRAST  TECHNIQUE: Multidetector CT imaging of the abdomen and pelvis was performed using the standard protocol following bolus administration of intravenous contrast.  CONTRAST:  1077mOMNIPAQUE IOHEXOL 300 MG/ML  SOLN  COMPARISON:  CT abdomen pelvis dated 05/13/2013  FINDINGS: Moderate right and trace left pleural effusions. Associated lower lobe atelectasis.  Status post cholecystectomy. Complex fluid collection/abscess arising from the surgical bed and extending along the medial segment left hepatic lobe to the anterior hepatic dome (series 2/image 11). Collection is multiloculated and difficult to discretely measure, but is approximately 7.4 x 6.5 x 9.4 cm (series 2/ image 22; coronal image 32). Foci of gas within the collection, some of which is nondependent. This appearance is compatible with a postoperative abscess or possibly an infected biloma.  Adjacent 3.1 x 2.5 x 3.6 cm fluid collection/abscess in the posterior segment right hepatic lobe (series 2/image 26). Additional bilobed fluid collection inferior to the left hepatic lobe and beneath the anterior abdominal wall, measuring approximately 3.0 x 3.5 x 3.2 cm along its right aspect (series 2/ image 23) and 4.3 x 4.8 x 6.8 cm along its left aspect (series 2/image 29; coronal image 15).  Liver is otherwise unremarkable.  Spleen, pancreas, and adrenal glands are within normal limits.  Bilateral renal cysts, measuring up to 2.1 cm in the posterior right upper pole (series  2/ image 30) and 2.2 cm in the medial left lower pole (series 2/image 38). No hydronephrosis.  No evidence of bowel obstruction.  Normal appendix.  No evidence of abdominal aortic aneurysm.  No abdominopelvic ascites.  No suspicious abdominopelvic lymphadenopathy.  Prostate is notable for enlargement of the central gland which indents the base of the bladder.  Dystrophic calcifications just posterior to  the right bladder (series 2/image 80). Bladder is otherwise unremarkable  IMPRESSION: Status post cholecystectomy.  9.4 cm multiloculated abscess/infected biloma arising from the surgical bed, extending along the medial segment left hepatic lobe, and overlying the left hepatic dome.  Adjacent 3.6 cm abscess in the posterior segment right hepatic lobe.  Adjacent bilobed abscess extending from the left hepatic lobe to beneath the anterior abdominal wall, measuring up to 6.5 cm in maximal dimension.  Foci of gas within the dominant collection.  No free air.  These results will be called to the ordering clinician or representative by the Radiologist Assistant, and communication documented in the PACS Dashboard.   Electronically Signed   By: Julian Hy M.D.   On: 08/14/2013 17:08   Ct Image Guided Drainage Percut Cath  Peritoneal Retroperit  08/15/2013   ADDENDUM REPORT: 08/15/2013 19:08  ADDENDUM: 3 mg Versed said and 100 mcg fentanyl were administered intravenously during a moderate sedation.   Electronically Signed   By: Jacqulynn Cadet M.D.   On: 08/15/2013 19:08   08/15/2013   CLINICAL DATA:  78 year old male with multifocal intra-abdominal abscesses status post laparoscopic cholecystectomy. He requires percutaneous drainage.  EXAM: CT GUIDED DRAINAGE OF gallbladder fossa/ perihepatic abscess ABSCESS; CT-guided drainage of perigastric abscess  ANESTHESIA/SEDATION: Versed said and fentanyl were administered intravenously.  Total Moderate Sedation Time:  40 minutes  PROCEDURE: The procedure, risks, benefits, and alternatives were explained to the patient. Questions regarding the procedure were encouraged and answered. The patient understands and consents to the procedure.  A planning axial CT scan was performed. Both the perigastric fluid collection in the mid epigastrium and the gallbladder fossa and perihepatic collection in the right upper quadrant were successfully identified. Suitable skin entry site  was selected and marked.  The abdomen was prepped with Betadinein a sterile fashion, and a sterile drape was applied covering the operative field. A sterile gown and sterile gloves were used for the procedure. Local anesthesia was provided with 1% Lidocaine.  Attention was first turned to the right upper quadrant. Using intermittent CT fluoroscopic guidance, an 18 gauge trocar needle was carefully advanced over rib and superiorly over the colon and up into the gallbladder fossa fluid collection. An 035 wire was coiled in the tract serially dilated to 12 [place]. A Cook 12 [place] all-purpose drainage catheter was advanced over the wire and formed within the fluid collection. Approximately 200 mL of purulent, brown, bilious fluid was aspirated. The drain was connected to JP bulb suction and sutured in place with a 0 silk suture.  Attention was next turned to the abscess collection anterior to the gastric antrum in the mid epigastric region. Again, with intermittent CT fluoroscopic guidance a trocar needle was advanced into the fluid collection followed by a wire. The tract was dilated to 10 [place] and a [place] all-purpose drainage catheter was formed within the fluid collection. Approximately 70 mL of thick, frankly purulent fluid was aspirated. The cavity was lavaged with sterile normal saline and this was also aspirated. The catheter was secured to the skin with 0 silk suture and connected to  JP bulb suction.  Post drain placement axial CT imaging demonstrates excellent drain positioning of a both catheters. The perigastric catheter is nearly completely resolved. There is some residual fluid anterior to the right hemi liver which should drain with the patient and in head up position. The patient tolerated the procedure well. There was no immediate complication. Samples of both fluid aspirates were sent for culture.  COMPLICATIONS: None  FINDINGS: As above  IMPRESSION: 1. Successful placement of a 12 French  drain in the right upper quadrant/gallbladder fossa abscess collection with aspiration of 200 mL of purulent bilious fluid. A sample was sent for culture. The tube was left to JP bulb suction. 2. Successful placement of a 10 French drain in the mid epigastric/parepigastric abscess collection with aspiration of 70 mL L of thick, frankly purulent fluid. A sample was sent for culture. The tube was left to JP bulb suction. Signed,  Criselda Peaches, MD  [facility]  [facility]  Electronically Signed: By: Jacqulynn Cadet M.D. On: 08/15/2013 18:59   Ct Image Guided Drainage Percut Cath  Peritoneal Retroperit  08/15/2013   ADDENDUM REPORT: 08/15/2013 19:08  ADDENDUM: 3 mg Versed said and 100 mcg fentanyl were administered intravenously during a moderate sedation.   Electronically Signed   By: Jacqulynn Cadet M.D.   On: 08/15/2013 19:08   08/15/2013   CLINICAL DATA:  78 year old male with multifocal intra-abdominal abscesses status post laparoscopic cholecystectomy. He requires percutaneous drainage.  EXAM: CT GUIDED DRAINAGE OF gallbladder fossa/ perihepatic abscess ABSCESS; CT-guided drainage of perigastric abscess  ANESTHESIA/SEDATION: Versed said and fentanyl were administered intravenously.  Total Moderate Sedation Time:  40 minutes  PROCEDURE: The procedure, risks, benefits, and alternatives were explained to the patient. Questions regarding the procedure were encouraged and answered. The patient understands and consents to the procedure.  A planning axial CT scan was performed. Both the perigastric fluid collection in the mid epigastrium and the gallbladder fossa and perihepatic collection in the right upper quadrant were successfully identified. Suitable skin entry site was selected and marked.  The abdomen was prepped with Betadinein a sterile fashion, and a sterile drape was applied covering the operative field. A sterile gown and sterile gloves were used  for the procedure. Local anesthesia was provided with 1% Lidocaine.  Attention was first turned to the right upper quadrant. Using intermittent CT fluoroscopic guidance, an 18 gauge trocar needle was carefully advanced over rib and superiorly over the colon and up into the gallbladder fossa fluid collection. An 035 wire was coiled in the tract serially dilated to 12 [place]. A Cook 12 [place] all-purpose drainage catheter was advanced over the wire and formed within the fluid collection. Approximately 200 mL of purulent, brown, bilious fluid was aspirated. The drain was connected to JP bulb suction and sutured in place with a 0 silk suture.  Attention was next turned to the abscess collection anterior to the gastric antrum in the mid epigastric region. Again, with intermittent CT fluoroscopic guidance a trocar needle was advanced into the fluid collection followed by a wire. The tract was dilated to 10 [place] and a [place] all-purpose drainage catheter was formed within the fluid collection. Approximately 70 mL of thick, frankly purulent fluid was aspirated. The cavity was lavaged with sterile normal saline and this was also aspirated. The catheter was secured to the skin with 0 silk suture and connected to JP bulb suction.  Post drain placement axial CT imaging demonstrates excellent  drain positioning of a both catheters. The perigastric catheter is nearly completely resolved. There is some residual fluid anterior to the right hemi liver which should drain with the patient and in head up position. The patient tolerated the procedure well. There was no immediate complication. Samples of both fluid aspirates were sent for culture.  COMPLICATIONS: None  FINDINGS: As above  IMPRESSION: 1. Successful placement of a 12 French drain in the right upper quadrant/gallbladder fossa abscess collection with aspiration of 200 mL of purulent bilious fluid. A sample was sent for culture. The tube was left to JP bulb suction.  2. Successful placement of a 10 French drain in the mid epigastric/parepigastric abscess collection with aspiration of 70 mL L of thick, frankly purulent fluid. A sample was sent for culture. The tube was left to JP bulb suction. Signed,  Criselda Peaches, MD  [facility]  [facility]  Electronically Signed: By: Jacqulynn Cadet M.D. On: 08/15/2013 18:59    Assessment/Plan:  78yo M with recent lap chole presents with fever, abdominal pain, leukocytosis found to have perihepatic abscess and enterococcal bacteremia. Bacteremia likely secondary to intra abdominal infection  - continue on vancomycin and piptazo for now - will follow up on cx from intra abdominal infection - regarding enterococcal bacteremia- = will repeat blood cx today as well as order TTE. Will need to evaluate for enterococcal endocarditis. - will likely need prolonged IV antibiotics of 4-6 wks for intra abd abscess then convert to oral antibotics and follow with serial imaging.  [facility] [phone]

## 2013-08-16 NOTE — Progress Notes (Signed)
Subjective: Patient sore around both percutaneous drains HIDA showed no sign of leak  Objective: Vital signs in last 24 hours: Temp:  [98.4 F (36.9 C)-100.4 F (38 C)] 98.6 F (37 C) (02/28 0746) Pulse Rate:  [88-121] 88 (02/28 0800) Resp:  [12-24] 12 (02/28 0800) BP: (110-160)/(54-89) 142/75 mmHg (02/28 0800) SpO2:  [97 %-100 %] 100 % (02/28 0800) Weight:  [135 lb 2.3 oz (61.3 kg)] 135 lb 2.3 oz (61.3 kg) (02/28 0400)    Intake/Output from previous day: 02/27 0701 - 02/28 0700 In: 2363 [I.V.:1950; Blood:363; IV Piggyback:50] Out: 1990 [Urine:1525; Drains:465] Intake/Output this shift: Total I/O In: 150 [I.V.:150] Out: 605 [Urine:575; Drains:30]  General appearance: alert, cooperative and no distress GI: soft, mildly tender around RUQ drain and anterior drain Anterior drain with purulent output; RUQ drain with serosanguinous output  Lab Results:   Recent Labs  08/15/13 0233 08/16/13 0245  WBC 19.0* 11.8*  HGB 8.7* 7.3*  HCT 26.0* 22.0*  PLT 254 180   BMET  Recent Labs  08/15/13 0233 08/15/13 1836  NA 140 144  K 4.8 4.2  CL 106 107  CO2 26 26  GLUCOSE 238* 218*  BUN 29* 31*  CREATININE 1.04 1.18  CALCIUM 8.0* 8.1*   PT/INR  Recent Labs  08/15/13 1230  LABPROT 19.5*  INR 1.70*   ABG No results found for this basename: PHART, PCO2, PO2, HCO3,  in the last 72 hours  Studies/Results: Dg Chest 2 View  08/14/2013   CLINICAL DATA:  Fever, postoperative state  EXAM: CHEST  2 VIEW  COMPARISON:  DG CHEST 2 VIEW dated 07/28/2013  FINDINGS: There is a moderate-sized pleural effusion layering posteriorly on the right. A discrete alveolar infiltrate on the right is not demonstrated. The left lung is clear. The cardiac silhouette is normal in size. The pulmonary vascularity is not clearly engorged. The trachea is midline. The observed portions of the bony thorax exhibit no acute abnormality.  IMPRESSION: There is a moderate sized right-sided pleural effusion  layering posteriorly.   Electronically Signed   By: David  Martinique   On: 08/14/2013 14:18   Nm Hepatobiliary Liver Func  08/15/2013   CLINICAL DATA:  Abdominal pain, post cholecystectomy.  EXAM: NUCLEAR MEDICINE HEPATOBILIARY IMAGING  TECHNIQUE: Sequential images of the abdomen were obtained out to 60 minutes following intravenous administration of radiopharmaceutical.  COMPARISON:  Abdominal CT 08/14/2013  RADIOPHARMACEUTICALS:  5 mCi Tc-49m Choletec  FINDINGS: The radiopharmaceutical was taken up by the liver and excreted into the biliary system. There are areas of photopenia involving the central right hepatic lobe related to the known fluid or abscess collections. There is no evidence for a bile leak. Activity is identified in the small bowel.  IMPRESSION: No evidence for a bile leak.  Photopenia areas in the liver are most compatible with known fluid or abscess collections.   Electronically Signed   By: Markus Daft M.D.   On: 08/15/2013 09:34   Ct Abdomen Pelvis W Contrast  08/14/2013   CLINICAL DATA:  Fever, abdominal pain, status post lap cholecystectomy 1 week ago  EXAM: CT ABDOMEN AND PELVIS WITH CONTRAST  TECHNIQUE: Multidetector CT imaging of the abdomen and pelvis was performed using the standard protocol following bolus administration of intravenous contrast.  CONTRAST:  160mL OMNIPAQUE IOHEXOL 300 MG/ML  SOLN  COMPARISON:  CT abdomen pelvis dated 05/13/2013  FINDINGS: Moderate right and trace left pleural effusions. Associated lower lobe atelectasis.  Status post cholecystectomy. Complex fluid collection/abscess arising from  the surgical bed and extending along the medial segment left hepatic lobe to the anterior hepatic dome (series 2/image 11). Collection is multiloculated and difficult to discretely measure, but is approximately 7.4 x 6.5 x 9.4 cm (series 2/ image 22; coronal image 32). Foci of gas within the collection, some of which is nondependent. This appearance is compatible with a  postoperative abscess or possibly an infected biloma.  Adjacent 3.1 x 2.5 x 3.6 cm fluid collection/abscess in the posterior segment right hepatic lobe (series 2/image 26). Additional bilobed fluid collection inferior to the left hepatic lobe and beneath the anterior abdominal wall, measuring approximately 3.0 x 3.5 x 3.2 cm along its right aspect (series 2/ image 23) and 4.3 x 4.8 x 6.8 cm along its left aspect (series 2/image 29; coronal image 15).  Liver is otherwise unremarkable.  Spleen, pancreas, and adrenal glands are within normal limits.  Bilateral renal cysts, measuring up to 2.1 cm in the posterior right upper pole (series 2/ image 30) and 2.2 cm in the medial left lower pole (series 2/image 38). No hydronephrosis.  No evidence of bowel obstruction.  Normal appendix.  No evidence of abdominal aortic aneurysm.  No abdominopelvic ascites.  No suspicious abdominopelvic lymphadenopathy.  Prostate is notable for enlargement of the central gland which indents the base of the bladder.  Dystrophic calcifications just posterior to the right bladder (series 2/image 80). Bladder is otherwise unremarkable  IMPRESSION: Status post cholecystectomy.  9.4 cm multiloculated abscess/infected biloma arising from the surgical bed, extending along the medial segment left hepatic lobe, and overlying the left hepatic dome.  Adjacent 3.6 cm abscess in the posterior segment right hepatic lobe.  Adjacent bilobed abscess extending from the left hepatic lobe to beneath the anterior abdominal wall, measuring up to 6.5 cm in maximal dimension.  Foci of gas within the dominant collection.  No free air.  These results will be called to the ordering clinician or representative by the Radiologist Assistant, and communication documented in the PACS Dashboard.   Electronically Signed   By: Julian Hy M.D.   On: 08/14/2013 17:08   Ct Image Guided Drainage Percut Cath  Peritoneal Retroperit  08/15/2013   ADDENDUM REPORT: 08/15/2013  19:08  ADDENDUM: 3 mg Versed said and 100 mcg fentanyl were administered intravenously during a moderate sedation.   Electronically Signed   By: Jacqulynn Cadet M.D.   On: 08/15/2013 19:08   08/15/2013   CLINICAL DATA:  78 year old male with multifocal intra-abdominal abscesses status post laparoscopic cholecystectomy. He requires percutaneous drainage.  EXAM: CT GUIDED DRAINAGE OF gallbladder fossa/ perihepatic abscess ABSCESS; CT-guided drainage of perigastric abscess  ANESTHESIA/SEDATION: Versed said and fentanyl were administered intravenously.  Total Moderate Sedation Time:  40 minutes  PROCEDURE: The procedure, risks, benefits, and alternatives were explained to the patient. Questions regarding the procedure were encouraged and answered. The patient understands and consents to the procedure.  A planning axial CT scan was performed. Both the perigastric fluid collection in the mid epigastrium and the gallbladder fossa and perihepatic collection in the right upper quadrant were successfully identified. Suitable skin entry site was selected and marked.  The abdomen was prepped with Betadinein a sterile fashion, and a sterile drape was applied covering the operative field. A sterile gown and sterile gloves were used for the procedure. Local anesthesia was provided with 1% Lidocaine.  Attention was first turned to the right upper quadrant. Using intermittent CT fluoroscopic guidance, an 18 gauge trocar needle was carefully advanced over  rib and superiorly over the colon and up into the gallbladder fossa fluid collection. An 035 wire was coiled in the tract serially dilated to 12 [place]. A Cook 12 [place] all-purpose drainage catheter was advanced over the wire and formed within the fluid collection. Approximately 200 mL of purulent, brown, bilious fluid was aspirated. The drain was connected to JP bulb suction and sutured in place with a 0 silk suture.  Attention was next turned to the abscess collection anterior  to the gastric antrum in the mid epigastric region. Again, with intermittent CT fluoroscopic guidance a trocar needle was advanced into the fluid collection followed by a wire. The tract was dilated to 10 [place] and a [place] all-purpose drainage catheter was formed within the fluid collection. Approximately 70 mL of thick, frankly purulent fluid was aspirated. The cavity was lavaged with sterile normal saline and this was also aspirated. The catheter was secured to the skin with 0 silk suture and connected to JP bulb suction.  Post drain placement axial CT imaging demonstrates excellent drain positioning of a both catheters. The perigastric catheter is nearly completely resolved. There is some residual fluid anterior to the right hemi liver which should drain with the patient and in head up position. The patient tolerated the procedure well. There was no immediate complication. Samples of both fluid aspirates were sent for culture.  COMPLICATIONS: None  FINDINGS: As above  IMPRESSION: 1. Successful placement of a 12 French drain in the right upper quadrant/gallbladder fossa abscess collection with aspiration of 200 mL of purulent bilious fluid. A sample was sent for culture. The tube was left to JP bulb suction. 2. Successful placement of a 10 French drain in the mid epigastric/parepigastric abscess collection with aspiration of 70 mL L of thick, frankly purulent fluid. A sample was sent for culture. The tube was left to JP bulb suction. Signed,  Criselda Peaches, MD  [facility]  [facility]  Electronically Signed: By: Jacqulynn Cadet M.D. On: 08/15/2013 18:59   Ct Image Guided Drainage Percut Cath  Peritoneal Retroperit  08/15/2013   ADDENDUM REPORT: 08/15/2013 19:08  ADDENDUM: 3 mg Versed said and 100 mcg fentanyl were administered intravenously during a moderate sedation.   Electronically Signed   By: Jacqulynn Cadet M.D.   On: 08/15/2013 19:08    08/15/2013   CLINICAL DATA:  78 year old male with multifocal intra-abdominal abscesses status post laparoscopic cholecystectomy. He requires percutaneous drainage.  EXAM: CT GUIDED DRAINAGE OF gallbladder fossa/ perihepatic abscess ABSCESS; CT-guided drainage of perigastric abscess  ANESTHESIA/SEDATION: Versed said and fentanyl were administered intravenously.  Total Moderate Sedation Time:  40 minutes  PROCEDURE: The procedure, risks, benefits, and alternatives were explained to the patient. Questions regarding the procedure were encouraged and answered. The patient understands and consents to the procedure.  A planning axial CT scan was performed. Both the perigastric fluid collection in the mid epigastrium and the gallbladder fossa and perihepatic collection in the right upper quadrant were successfully identified. Suitable skin entry site was selected and marked.  The abdomen was prepped with Betadinein a sterile fashion, and a sterile drape was applied covering the operative field. A sterile gown and sterile gloves were used for the procedure. Local anesthesia was provided with 1% Lidocaine.  Attention was first turned to the right upper quadrant. Using intermittent CT fluoroscopic guidance, an 18 gauge trocar needle was carefully advanced over rib and superiorly over the colon and up into the gallbladder fossa  fluid collection. An 035 wire was coiled in the tract serially dilated to 12 [place]. A Cook 12 [place] all-purpose drainage catheter was advanced over the wire and formed within the fluid collection. Approximately 200 mL of purulent, brown, bilious fluid was aspirated. The drain was connected to JP bulb suction and sutured in place with a 0 silk suture.  Attention was next turned to the abscess collection anterior to the gastric antrum in the mid epigastric region. Again, with intermittent CT fluoroscopic guidance a trocar needle was advanced into the fluid collection followed by a wire. The tract was  dilated to 10 [place] and a [place] all-purpose drainage catheter was formed within the fluid collection. Approximately 70 mL of thick, frankly purulent fluid was aspirated. The cavity was lavaged with sterile normal saline and this was also aspirated. The catheter was secured to the skin with 0 silk suture and connected to JP bulb suction.  Post drain placement axial CT imaging demonstrates excellent drain positioning of a both catheters. The perigastric catheter is nearly completely resolved. There is some residual fluid anterior to the right hemi liver which should drain with the patient and in head up position. The patient tolerated the procedure well. There was no immediate complication. Samples of both fluid aspirates were sent for culture.  COMPLICATIONS: None  FINDINGS: As above  IMPRESSION: 1. Successful placement of a 12 French drain in the right upper quadrant/gallbladder fossa abscess collection with aspiration of 200 mL of purulent bilious fluid. A sample was sent for culture. The tube was left to JP bulb suction. 2. Successful placement of a 10 French drain in the mid epigastric/parepigastric abscess collection with aspiration of 70 mL L of thick, frankly purulent fluid. A sample was sent for culture. The tube was left to JP bulb suction. Signed,  Criselda Peaches, MD  [facility]  [facility]  Electronically Signed: By: Jacqulynn Cadet M.D. On: 08/15/2013 18:59    Anti-infectives: Anti-infectives   Start     Dose/Rate Route Frequency Ordered Stop   08/15/13 1200  vancomycin (VANCOCIN) 1,250 mg in sodium chloride 0.9 % 250 mL IVPB     1,250 mg 166.7 mL/hr over 90 Minutes Intravenous Every 24 hours 08/14/13 1911     08/15/13 0000  piperacillin-tazobactam (ZOSYN) IVPB 3.375 g     3.375 g 12.5 mL/hr over 240 Minutes Intravenous Every 8 hours 08/14/13 1911     08/14/13 1530  vancomycin (VANCOCIN) IVPB 1000 mg/200 mL premix     1,000  mg 200 mL/hr over 60 Minutes Intravenous  Once 08/14/13 1526 08/14/13 1823   08/14/13 1530  piperacillin-tazobactam (ZOSYN) IVPB 3.375 g     3.375 g 100 mL/hr over 30 Minutes Intravenous  Once 08/14/13 1526 08/14/13 1603      Assessment/Plan: s/p laparoscopic cholecystectomy complicated by intra-abdominal abscesses clear liquids Continue abx for now  LOS: 2 days    Latroy Gaymon K. 08/16/2013

## 2013-08-16 NOTE — Progress Notes (Signed)
  Echocardiogram 2D Echocardiogram has been performed.  Mike Mack, Mike Mack 08/16/2013, 4:31 PM

## 2013-08-16 NOTE — Progress Notes (Signed)
[facility] Progress Note [place] TEAM 1 - Stepdown/ICU TEAM   Mike Mack HCW:[id number] DOB: 11-27-1935 DOA: 08/14/2013 PCP: [name], MD  Brief narrative: Mike Mack is a 78 y.o. male presenting on 08/14/2013 with HTN, HLD, prior stroke, recent laparoscopic cholecystectomy 07/25/2013 by [name], and discharged to [facility] 07/30/2013, now presented to [facility] ED with main concern of several days duration of persistent epigastric abdominal pain, throbbing and 5/10 in severity, non radiating, associated with fevers, chills, malaise, poor oral intake, no specific aggravating or alleviating factors. No similar events in the past. Please note that pt is poor historian and family at bedside helped with providing history. There has been reported exertional shortness of breath and chest tightness but details not available. Pt currently denies chest pain or shortness of breath.  In ED, CT abdomen notable for abscess in the gallbladder bed, hepatic abscess, and an additional fluid collection in the abdomen. Surgery consulted and [facility] asked to admit for further evaluation. Pt started on Vancomycin and Zosyn in ED.   Subjective: Drifting in an out of sleep -states pain in abdomen is 6/10- spoke with wife at bedside.   Assessment/Plan: Principal Problem:   Severe sepsis/  Hepatic abscess/  Right upper quadrant abdominal abscess - post surgical after cholecystectomy- now with 2 perc drains - bacteremia with enterococcus- f/u sensitivities  Active Problems:   Diabetes mellitus with Neuropathy - cont lantus and sliding scale    Hypertension - hold ARB due to dehydration - BP stable for now    H/o Stroke - med rec does not mention ASA or Statin    Anemia - follow   Chronic steroid use - no need for stress dose steroids currently  Thrush - cont Nystatin - has sore throat as well which may be consistent with candida esopahagitis  BPH - Flomax  Elevated INR - due to poor  nutrition?- given 1 U FFP prior to procedure and Vit K     Code Status: Full code Family Communication: with wife Disposition Plan: follow in SDU  Consultants: Surg  IR  Procedures: none  Antibiotics: Antibiotics Given (last 72 hours)   Date/Time Action Medication Dose Rate   08/15/13 0126 Given   piperacillin-tazobactam (ZOSYN) IVPB 3.375 g 3.375 g 12.5 mL/hr   08/15/13 1059 Given   piperacillin-tazobactam (ZOSYN) IVPB 3.375 g 3.375 g 12.5 mL/hr   08/15/13 1313 Given   vancomycin (VANCOCIN) 1,250 mg in sodium chloride 0.9 % 250 mL IVPB 1,250 mg 166.7 mL/hr   08/15/13 1841 Given   piperacillin-tazobactam (ZOSYN) IVPB 3.375 g 3.375 g 12.5 mL/hr   08/15/13 2348 Given   piperacillin-tazobactam (ZOSYN) IVPB 3.375 g 3.375 g 12.5 mL/hr   08/16/13 0809 Given   piperacillin-tazobactam (ZOSYN) IVPB 3.375 g 3.375 g 12.5 mL/hr       DVT prophylaxis: Lovenox  Objective: Filed Weights   08/14/13 2300 08/15/13 0500 08/16/13 0400  Weight: 59.8 kg (131 lb 13.4 oz) 61.1 kg (134 lb 11.2 oz) 61.3 kg (135 lb 2.3 oz)   Blood pressure 142/75, pulse 88, temperature 98.6 F (37 C), temperature source Oral, resp. rate 12, height 5\' 5"  (1.651 m), weight 61.3 kg (135 lb 2.3 oz), SpO2 100.00%.  Intake/Output Summary (Last 24 hours) at 08/16/13 1042 Last data filed at 08/16/13 0800  Gross per 24 hour  Intake   2513 ml  Output   2395 ml  Net    118 ml     Exam: General: No acute respiratory distress-  Lungs: Clear to auscultation bilaterally without wheezes or crackles Cardiovascular: Regular rate and rhythm without murmur gallop or rub normal S1 and S2- tachycardic SR in 130s Abdomen: RUQ drain with blood tinged fluid- other drain with pus, nondistended, soft, bowel sounds positive, no rebound, no ascites, no appreciable mass Extremities: No significant cyanosis, clubbing, or edema bilateral lower extremities  Data Reviewed: Basic Metabolic Panel:  Recent Labs Lab 08/14/13 1345  08/14/13 2200 08/15/13 0233 08/15/13 1836  NA 136*  --  140 144  K 5.2  --  4.8 4.2  CL 97  --  106 107  CO2 23  --  26 26  GLUCOSE 290*  --  238* 218*  BUN 31*  --  29* 31*  CREATININE 1.06  --  1.04 1.18  CALCIUM 8.6  --  8.0* 8.1*  MG  --  2.1  --   --   PHOS  --  3.1  --   --    Liver Function Tests:  Recent Labs Lab 08/14/13 1345 08/15/13 0233  AST 60* 57*  ALT 45 43  ALKPHOS 281* 246*  BILITOT 0.6 0.4  PROT 7.5 6.6  ALBUMIN 2.1* 1.8*   No results found for this basename: LIPASE, AMYLASE,  in the last 168 hours No results found for this basename: AMMONIA,  in the last 168 hours CBC:  Recent Labs Lab 08/14/13 1345 08/15/13 0233 08/16/13 0245  WBC 20.9* 19.0* 11.8*  NEUTROABS 20.4*  --   --   HGB 9.8* 8.7* 7.3*  HCT 30.0* 26.0* 22.0*  MCV 81.7 81.0 80.6  PLT 311 254 180   Cardiac Enzymes:  Recent Labs Lab 08/14/13 1345  TROPONINI <0.30   BNP (last 3 results) No results found for this basename: PROBNP,  in the last 8760 hours CBG:  Recent Labs Lab 08/15/13 0929 08/15/13 1148 08/15/13 1835 08/15/13 2247 08/16/13 0750  GLUCAP 268* 218* 204* 162* 116*    Recent Results (from the past 240 hour(s))  URINE CULTURE     Status: None   Collection Time    08/14/13 12:17 AM      Result Value Ref Range Status   Specimen Description URINE, CATHETERIZED   Final   Special Requests NONE   Final   Culture  Setup Time     Final   Value: 08/15/2013 03:29     Performed at [place]     Final   Value: NO GROWTH     Performed at [facility]   Culture     Final   Value: NO GROWTH     Performed at [facility]   Report Status 08/16/2013 FINAL   Final  CULTURE, BLOOD (ROUTINE X 2)     Status: None   Collection Time    08/14/13  2:38 PM      Result Value Ref Range Status   Specimen Description BLOOD ARM LEFT   Final   Special Requests BOTTLES DRAWN AEROBIC AND ANAEROBIC 5CC   Final   Culture  Setup Time      Final   Value: 08/14/2013 20:43     Performed at [facility]   Culture     Final   Value: ENTEROCOCCUS SPECIES     Note: Gram Stain Report Called to,Read Back By and Verified With: Drema Dallas 08/15/12 0815 BY [email or web address]     Performed at [facility]   Report Status PENDING   Incomplete  CULTURE, BLOOD (ROUTINE X 2)     Status: None   Collection Time    08/14/13  2:52 PM      Result Value Ref Range Status   Specimen Description BLOOD RIGHT FOREARM   Final   Special Requests BOTTLES DRAWN AEROBIC AND ANAEROBIC 5CC   Final   Culture  Setup Time     Final   Value: 08/14/2013 20:43     Performed at [facility]   Culture     Final   Value: ENTEROCOCCUS SPECIES     Note: Gram Stain Report Called to,Read Back By and Verified With: Drema Dallas 08/15/13 0815 BY SMITHERSJ     Performed at [facility]   Report Status PENDING   Incomplete  RAPID STREP SCREEN     Status: None   Collection Time    08/15/13 11:42 AM      Result Value Ref Range Status   Streptococcus, Group A Screen (Direct) NEGATIVE  NEGATIVE Final   Comment: (NOTE)     A Rapid Antigen test may result negative if the antigen level in the     sample is below the detection level of this test. The FDA has not     cleared this test as a stand-alone test therefore the rapid antigen     negative result has reflexed to a Group A Strep culture.  CULTURE, ROUTINE-ABSCESS     Status: None   Collection Time    08/15/13  6:52 PM      Result Value Ref Range Status   Specimen Description ABSCESS   Final   Special Requests     Final   Value: MID EPIGASTRUM IN THE ANTERIOR PERI GASTRIC SAMPLE NO 2   Gram Stain PENDING   Incomplete   Culture PENDING   Incomplete   Report Status PENDING   Incomplete     Studies:  Recent x-ray studies have been reviewed in detail by the Attending Physician  Scheduled Meds:  Scheduled Meds: . docusate sodium  100 mg Oral BID  . enoxaparin (LOVENOX) injection  40  mg Subcutaneous Q24H  . insulin aspart  0-15 Units Subcutaneous TID WC  . insulin aspart  0-5 Units Subcutaneous QHS  . insulin glargine  5 Units Subcutaneous QHS  . latanoprost  1 drop Both Eyes QHS  . nystatin  5 mL Oral BID  . piperacillin-tazobactam (ZOSYN)  IV  3.375 g Intravenous Q8H  . polyethylene glycol  17 g Oral TID  . predniSONE  5 mg Oral Q breakfast  . tamsulosin  0.4 mg Oral BID  . vancomycin  500 mg Intravenous Q12H   Continuous Infusions: . sodium chloride 150 mL/hr at 08/16/13 0549    Time spent on care of this patient: >35 min   Debbe Odea, MD  [facility] Office  [phone] Pager - Text Page per Shea Evans as per below:  On-Call/Text Page:      [email or web address]      password [email or web address]  If 7PM-7AM, please contact night-coverage [email or web address] Password [facility] 08/16/2013, 10:42 AM   LOS: 2 days

## 2013-08-16 NOTE — Progress Notes (Signed)
Subjective: Pt feeling sore at drain sites, but overall some better. Tolerated some clears already this morning.  Objective: Physical Exam: BP 142/75  Pulse 88  Temp(Src) 98.6 F (37 C) (Oral)  Resp 12  Ht 5\' 5"  (1.651 m)  Wt 135 lb 2.3 oz (61.3 kg)  BMI 22.49 kg/m2  SpO2 100% RUQ drains intact, sites clean, NT #1 drain(anterior) with purulent output 25cc new $2 drain (lateral) with serous output 5cc new    Labs: CBC  Recent Labs  08/15/13 0233 08/16/13 0245  WBC 19.0* 11.8*  HGB 8.7* 7.3*  HCT 26.0* 22.0*  PLT 254 180   BMET  Recent Labs  08/15/13 0233 08/15/13 1836  NA 140 144  K 4.8 4.2  CL 106 107  CO2 26 26  GLUCOSE 238* 218*  BUN 29* 31*  CREATININE 1.04 1.18  CALCIUM 8.0* 8.1*   LFT  Recent Labs  08/15/13 0233  PROT 6.6  ALBUMIN 1.8*  AST 57*  ALT 43  ALKPHOS 246*  BILITOT 0.4   PT/INR  Recent Labs  08/15/13 1230  LABPROT 19.5*  INR 1.70*     Studies/Results: Dg Chest 2 View  08/14/2013   CLINICAL DATA:  Fever, postoperative state  EXAM: CHEST  2 VIEW  COMPARISON:  DG CHEST 2 VIEW dated 07/28/2013  FINDINGS: There is a moderate-sized pleural effusion layering posteriorly on the right. A discrete alveolar infiltrate on the right is not demonstrated. The left lung is clear. The cardiac silhouette is normal in size. The pulmonary vascularity is not clearly engorged. The trachea is midline. The observed portions of the bony thorax exhibit no acute abnormality.  IMPRESSION: There is a moderate sized right-sided pleural effusion layering posteriorly.   Electronically Signed   By: David  Martinique   On: 08/14/2013 14:18   Nm Hepatobiliary Liver Func  08/15/2013   CLINICAL DATA:  Abdominal pain, post cholecystectomy.  EXAM: NUCLEAR MEDICINE HEPATOBILIARY IMAGING  TECHNIQUE: Sequential images of the abdomen were obtained out to 60 minutes following intravenous administration of radiopharmaceutical.  COMPARISON:  Abdominal CT 08/14/2013   RADIOPHARMACEUTICALS:  5 mCi Tc-82m Choletec  FINDINGS: The radiopharmaceutical was taken up by the liver and excreted into the biliary system. There are areas of photopenia involving the central right hepatic lobe related to the known fluid or abscess collections. There is no evidence for a bile leak. Activity is identified in the small bowel.  IMPRESSION: No evidence for a bile leak.  Photopenia areas in the liver are most compatible with known fluid or abscess collections.   Electronically Signed   By: Markus Daft M.D.   On: 08/15/2013 09:34   Ct Abdomen Pelvis W Contrast  08/14/2013   CLINICAL DATA:  Fever, abdominal pain, status post lap cholecystectomy 1 week ago  EXAM: CT ABDOMEN AND PELVIS WITH CONTRAST  TECHNIQUE: Multidetector CT imaging of the abdomen and pelvis was performed using the standard protocol following bolus administration of intravenous contrast.  CONTRAST:  161mL OMNIPAQUE IOHEXOL 300 MG/ML  SOLN  COMPARISON:  CT abdomen pelvis dated 05/13/2013  FINDINGS: Moderate right and trace left pleural effusions. Associated lower lobe atelectasis.  Status post cholecystectomy. Complex fluid collection/abscess arising from the surgical bed and extending along the medial segment left hepatic lobe to the anterior hepatic dome (series 2/image 11). Collection is multiloculated and difficult to discretely measure, but is approximately 7.4 x 6.5 x 9.4 cm (series 2/ image 22; coronal image 32). Foci of gas within the collection, some of  which is nondependent. This appearance is compatible with a postoperative abscess or possibly an infected biloma.  Adjacent 3.1 x 2.5 x 3.6 cm fluid collection/abscess in the posterior segment right hepatic lobe (series 2/image 26). Additional bilobed fluid collection inferior to the left hepatic lobe and beneath the anterior abdominal wall, measuring approximately 3.0 x 3.5 x 3.2 cm along its right aspect (series 2/ image 23) and 4.3 x 4.8 x 6.8 cm along its left aspect  (series 2/image 29; coronal image 15).  Liver is otherwise unremarkable.  Spleen, pancreas, and adrenal glands are within normal limits.  Bilateral renal cysts, measuring up to 2.1 cm in the posterior right upper pole (series 2/ image 30) and 2.2 cm in the medial left lower pole (series 2/image 38). No hydronephrosis.  No evidence of bowel obstruction.  Normal appendix.  No evidence of abdominal aortic aneurysm.  No abdominopelvic ascites.  No suspicious abdominopelvic lymphadenopathy.  Prostate is notable for enlargement of the central gland which indents the base of the bladder.  Dystrophic calcifications just posterior to the right bladder (series 2/image 80). Bladder is otherwise unremarkable  IMPRESSION: Status post cholecystectomy.  9.4 cm multiloculated abscess/infected biloma arising from the surgical bed, extending along the medial segment left hepatic lobe, and overlying the left hepatic dome.  Adjacent 3.6 cm abscess in the posterior segment right hepatic lobe.  Adjacent bilobed abscess extending from the left hepatic lobe to beneath the anterior abdominal wall, measuring up to 6.5 cm in maximal dimension.  Foci of gas within the dominant collection.  No free air.  These results will be called to the ordering clinician or representative by the Radiologist Assistant, and communication documented in the PACS Dashboard.   Electronically Signed   By: Julian Hy M.D.   On: 08/14/2013 17:08   Ct Image Guided Drainage Percut Cath  Peritoneal Retroperit  08/15/2013   ADDENDUM REPORT: 08/15/2013 19:08  ADDENDUM: 3 mg Versed said and 100 mcg fentanyl were administered intravenously during a moderate sedation.   Electronically Signed   By: Jacqulynn Cadet M.D.   On: 08/15/2013 19:08   08/15/2013   CLINICAL DATA:  78 year old male with multifocal intra-abdominal abscesses status post laparoscopic cholecystectomy. He requires percutaneous drainage.  EXAM: CT GUIDED DRAINAGE OF gallbladder fossa/  perihepatic abscess ABSCESS; CT-guided drainage of perigastric abscess  ANESTHESIA/SEDATION: Versed said and fentanyl were administered intravenously.  Total Moderate Sedation Time:  40 minutes  PROCEDURE: The procedure, risks, benefits, and alternatives were explained to the patient. Questions regarding the procedure were encouraged and answered. The patient understands and consents to the procedure.  A planning axial CT scan was performed. Both the perigastric fluid collection in the mid epigastrium and the gallbladder fossa and perihepatic collection in the right upper quadrant were successfully identified. Suitable skin entry site was selected and marked.  The abdomen was prepped with Betadinein a sterile fashion, and a sterile drape was applied covering the operative field. A sterile gown and sterile gloves were used for the procedure. Local anesthesia was provided with 1% Lidocaine.  Attention was first turned to the right upper quadrant. Using intermittent CT fluoroscopic guidance, an 18 gauge trocar needle was carefully advanced over rib and superiorly over the colon and up into the gallbladder fossa fluid collection. An 035 wire was coiled in the tract serially dilated to 12 [place]. A Cook 12 [place] all-purpose drainage catheter was advanced over the wire and formed within the fluid collection. Approximately 200 mL of purulent, brown, bilious  fluid was aspirated. The drain was connected to JP bulb suction and sutured in place with a 0 silk suture.  Attention was next turned to the abscess collection anterior to the gastric antrum in the mid epigastric region. Again, with intermittent CT fluoroscopic guidance a trocar needle was advanced into the fluid collection followed by a wire. The tract was dilated to 10 [place] and a [place] all-purpose drainage catheter was formed within the fluid collection. Approximately 70 mL of thick, frankly purulent fluid was aspirated. The cavity was lavaged with sterile  normal saline and this was also aspirated. The catheter was secured to the skin with 0 silk suture and connected to JP bulb suction.  Post drain placement axial CT imaging demonstrates excellent drain positioning of a both catheters. The perigastric catheter is nearly completely resolved. There is some residual fluid anterior to the right hemi liver which should drain with the patient and in head up position. The patient tolerated the procedure well. There was no immediate complication. Samples of both fluid aspirates were sent for culture.  COMPLICATIONS: None  FINDINGS: As above  IMPRESSION: 1. Successful placement of a 12 French drain in the right upper quadrant/gallbladder fossa abscess collection with aspiration of 200 mL of purulent bilious fluid. A sample was sent for culture. The tube was left to JP bulb suction. 2. Successful placement of a 10 French drain in the mid epigastric/parepigastric abscess collection with aspiration of 70 mL L of thick, frankly purulent fluid. A sample was sent for culture. The tube was left to JP bulb suction. Signed,  Criselda Peaches, MD  [facility]  [facility]  Electronically Signed: By: Jacqulynn Cadet M.D. On: 08/15/2013 18:59   Ct Image Guided Drainage Percut Cath  Peritoneal Retroperit  08/15/2013   ADDENDUM REPORT: 08/15/2013 19:08  ADDENDUM: 3 mg Versed said and 100 mcg fentanyl were administered intravenously during a moderate sedation.   Electronically Signed   By: Jacqulynn Cadet M.D.   On: 08/15/2013 19:08   08/15/2013   CLINICAL DATA:  78 year old male with multifocal intra-abdominal abscesses status post laparoscopic cholecystectomy. He requires percutaneous drainage.  EXAM: CT GUIDED DRAINAGE OF gallbladder fossa/ perihepatic abscess ABSCESS; CT-guided drainage of perigastric abscess  ANESTHESIA/SEDATION: Versed said and fentanyl were administered intravenously.  Total Moderate Sedation Time:  40 minutes   PROCEDURE: The procedure, risks, benefits, and alternatives were explained to the patient. Questions regarding the procedure were encouraged and answered. The patient understands and consents to the procedure.  A planning axial CT scan was performed. Both the perigastric fluid collection in the mid epigastrium and the gallbladder fossa and perihepatic collection in the right upper quadrant were successfully identified. Suitable skin entry site was selected and marked.  The abdomen was prepped with Betadinein a sterile fashion, and a sterile drape was applied covering the operative field. A sterile gown and sterile gloves were used for the procedure. Local anesthesia was provided with 1% Lidocaine.  Attention was first turned to the right upper quadrant. Using intermittent CT fluoroscopic guidance, an 18 gauge trocar needle was carefully advanced over rib and superiorly over the colon and up into the gallbladder fossa fluid collection. An 035 wire was coiled in the tract serially dilated to 12 [place]. A Cook 12 [place] all-purpose drainage catheter was advanced over the wire and formed within the fluid collection. Approximately 200 mL of purulent, brown, bilious fluid was aspirated. The drain was connected to JP bulb suction and  sutured in place with a 0 silk suture.  Attention was next turned to the abscess collection anterior to the gastric antrum in the mid epigastric region. Again, with intermittent CT fluoroscopic guidance a trocar needle was advanced into the fluid collection followed by a wire. The tract was dilated to 10 [place] and a [place] all-purpose drainage catheter was formed within the fluid collection. Approximately 70 mL of thick, frankly purulent fluid was aspirated. The cavity was lavaged with sterile normal saline and this was also aspirated. The catheter was secured to the skin with 0 silk suture and connected to JP bulb suction.  Post drain placement axial CT imaging demonstrates excellent  drain positioning of a both catheters. The perigastric catheter is nearly completely resolved. There is some residual fluid anterior to the right hemi liver which should drain with the patient and in head up position. The patient tolerated the procedure well. There was no immediate complication. Samples of both fluid aspirates were sent for culture.  COMPLICATIONS: None  FINDINGS: As above  IMPRESSION: 1. Successful placement of a 12 French drain in the right upper quadrant/gallbladder fossa abscess collection with aspiration of 200 mL of purulent bilious fluid. A sample was sent for culture. The tube was left to JP bulb suction. 2. Successful placement of a 10 French drain in the mid epigastric/parepigastric abscess collection with aspiration of 70 mL L of thick, frankly purulent fluid. A sample was sent for culture. The tube was left to JP bulb suction. Signed,  Criselda Peaches, MD  [facility]  [facility]  Electronically Signed: By: Jacqulynn Cadet M.D. On: 08/15/2013 18:59    Assessment/Plan: S/p RUQ abscess drains X 2 WBC down Cx pending Cont drain flushes     LOS: 2 days    Ascencion Dike PA-C 08/16/2013 10:47 AM

## 2013-08-17 LAB — GLUCOSE, CAPILLARY
Glucose-Capillary: 116 mg/dL — ABNORMAL HIGH (ref 70–99)
Glucose-Capillary: 152 mg/dL — ABNORMAL HIGH (ref 70–99)
Glucose-Capillary: 149 mg/dL — ABNORMAL HIGH (ref 70–99)

## 2013-08-17 LAB — CULTURE, GROUP A STREP

## 2013-08-17 LAB — CULTURE, BLOOD (ROUTINE X 2)

## 2013-08-17 LAB — CBC
HEMATOCRIT: 25.1 % — AB (ref 39.0–52.0)
HEMOGLOBIN: 8.2 g/dL — AB (ref 13.0–17.0)
MCH: 26.4 pg (ref 26.0–34.0)
MCHC: 32.7 g/dL (ref 30.0–36.0)
MCV: 80.7 fL (ref 78.0–100.0)
Platelets: 223 10*3/uL (ref 150–400)
RBC: 3.11 MIL/uL — ABNORMAL LOW (ref 4.22–5.81)
RDW: 17.3 % — AB (ref 11.5–15.5)
WBC: 11.9 10*3/uL — ABNORMAL HIGH (ref 4.0–10.5)

## 2013-08-17 MED ORDER — NYSTATIN 100000 UNIT/ML MT SUSP
5.0000 mL | Freq: Four times a day (QID) | OROMUCOSAL | Status: DC
  Administered 2013-08-17 – 2013-08-30 (×47): 500000 [IU] via ORAL
  Filled 2013-08-17 (×56): qty 5

## 2013-08-17 MED ORDER — SODIUM CHLORIDE 0.45 % IV SOLN
INTRAVENOUS | Status: DC
  Administered 2013-08-17: 75 mL/h via INTRAVENOUS
  Administered 2013-08-18 – 2013-08-20 (×5): via INTRAVENOUS

## 2013-08-17 MED ORDER — FLUCONAZOLE 100MG IVPB
100.0000 mg | INTRAVENOUS | Status: DC
  Administered 2013-08-17 – 2013-08-18 (×2): 100 mg via INTRAVENOUS
  Filled 2013-08-17 (×2): qty 50

## 2013-08-17 MED ORDER — IRBESARTAN 300 MG PO TABS
300.0000 mg | ORAL_TABLET | Freq: Every day | ORAL | Status: DC
  Administered 2013-08-17 – 2013-08-27 (×11): 300 mg via ORAL
  Filled 2013-08-17 (×16): qty 1

## 2013-08-17 NOTE — Progress Notes (Signed)
Subjective: Comfortable Wants to try full liquids  Objective: Vital signs in last 24 hours: Temp:  [98 F (36.7 C)-98.6 F (37 C)] 98.4 F (36.9 C) (02/28 2354) Pulse Rate:  [77-89] 80 (03/01 0400) Resp:  [11-17] 17 (03/01 0400) BP: (109-136)/(61-69) 132/65 mmHg (03/01 0400) SpO2:  [96 %-99 %] 96 % (03/01 0400) Weight:  [143 lb 11.8 oz (65.2 kg)] 143 lb 11.8 oz (65.2 kg) (03/01 0500)    Intake/Output from previous day: 02/28 0701 - 03/01 0700 In: 1700 [P.O.:180; I.V.:1500] Out: 1522.5 [Urine:1475; Drains:47.5] Intake/Output this shift:    Looks ok Abdomen soft, drains with some purulent drainage  Lab Results:   Recent Labs  08/15/13 0233 08/16/13 0245  WBC 19.0* 11.8*  HGB 8.7* 7.3*  HCT 26.0* 22.0*  PLT 254 180   BMET  Recent Labs  08/15/13 0233 08/15/13 1836  NA 140 144  K 4.8 4.2  CL 106 107  CO2 26 26  GLUCOSE 238* 218*  BUN 29* 31*  CREATININE 1.04 1.18  CALCIUM 8.0* 8.1*   PT/INR  Recent Labs  08/15/13 1230 08/16/13 1005  LABPROT 19.5* 18.7*  INR 1.70* 1.61*   ABG No results found for this basename: PHART, PCO2, PO2, HCO3,  in the last 72 hours  Studies/Results: Nm Hepatobiliary Liver Func  08/15/2013   CLINICAL DATA:  Abdominal pain, post cholecystectomy.  EXAM: NUCLEAR MEDICINE HEPATOBILIARY IMAGING  TECHNIQUE: Sequential images of the abdomen were obtained out to 60 minutes following intravenous administration of radiopharmaceutical.  COMPARISON:  Abdominal CT 08/14/2013  RADIOPHARMACEUTICALS:  5 mCi Tc-93m Choletec  FINDINGS: The radiopharmaceutical was taken up by the liver and excreted into the biliary system. There are areas of photopenia involving the central right hepatic lobe related to the known fluid or abscess collections. There is no evidence for a bile leak. Activity is identified in the small bowel.  IMPRESSION: No evidence for a bile leak.  Photopenia areas in the liver are most compatible with known fluid or abscess  collections.   Electronically Signed   By: Markus Daft M.D.   On: 08/15/2013 09:34   Ct Image Guided Drainage Percut Cath  Peritoneal Retroperit  08/15/2013   ADDENDUM REPORT: 08/15/2013 19:08  ADDENDUM: 3 mg Versed said and 100 mcg fentanyl were administered intravenously during a moderate sedation.   Electronically Signed   By: Jacqulynn Cadet M.D.   On: 08/15/2013 19:08   08/15/2013   CLINICAL DATA:  78 year old male with multifocal intra-abdominal abscesses status post laparoscopic cholecystectomy. He requires percutaneous drainage.  EXAM: CT GUIDED DRAINAGE OF gallbladder fossa/ perihepatic abscess ABSCESS; CT-guided drainage of perigastric abscess  ANESTHESIA/SEDATION: Versed said and fentanyl were administered intravenously.  Total Moderate Sedation Time:  40 minutes  PROCEDURE: The procedure, risks, benefits, and alternatives were explained to the patient. Questions regarding the procedure were encouraged and answered. The patient understands and consents to the procedure.  A planning axial CT scan was performed. Both the perigastric fluid collection in the mid epigastrium and the gallbladder fossa and perihepatic collection in the right upper quadrant were successfully identified. Suitable skin entry site was selected and marked.  The abdomen was prepped with Betadinein a sterile fashion, and a sterile drape was applied covering the operative field. A sterile gown and sterile gloves were used for the procedure. Local anesthesia was provided with 1% Lidocaine.  Attention was first turned to the right upper quadrant. Using intermittent CT fluoroscopic guidance, an 18 gauge trocar needle was carefully advanced over  rib and superiorly over the colon and up into the gallbladder fossa fluid collection. An 035 wire was coiled in the tract serially dilated to 12 [place]. A Cook 12 [place] all-purpose drainage catheter was advanced over the wire and formed within the fluid collection. Approximately 200 mL of  purulent, brown, bilious fluid was aspirated. The drain was connected to JP bulb suction and sutured in place with a 0 silk suture.  Attention was next turned to the abscess collection anterior to the gastric antrum in the mid epigastric region. Again, with intermittent CT fluoroscopic guidance a trocar needle was advanced into the fluid collection followed by a wire. The tract was dilated to 10 [place] and a [place] all-purpose drainage catheter was formed within the fluid collection. Approximately 70 mL of thick, frankly purulent fluid was aspirated. The cavity was lavaged with sterile normal saline and this was also aspirated. The catheter was secured to the skin with 0 silk suture and connected to JP bulb suction.  Post drain placement axial CT imaging demonstrates excellent drain positioning of a both catheters. The perigastric catheter is nearly completely resolved. There is some residual fluid anterior to the right hemi liver which should drain with the patient and in head up position. The patient tolerated the procedure well. There was no immediate complication. Samples of both fluid aspirates were sent for culture.  COMPLICATIONS: None  FINDINGS: As above  IMPRESSION: 1. Successful placement of a 12 French drain in the right upper quadrant/gallbladder fossa abscess collection with aspiration of 200 mL of purulent bilious fluid. A sample was sent for culture. The tube was left to JP bulb suction. 2. Successful placement of a 10 French drain in the mid epigastric/parepigastric abscess collection with aspiration of 70 mL L of thick, frankly purulent fluid. A sample was sent for culture. The tube was left to JP bulb suction. Signed,  Criselda Peaches, MD  [facility]  [facility]  Electronically Signed: By: Jacqulynn Cadet M.D. On: 08/15/2013 18:59   Ct Image Guided Drainage Percut Cath  Peritoneal Retroperit  08/15/2013   ADDENDUM REPORT: 08/15/2013  19:08  ADDENDUM: 3 mg Versed said and 100 mcg fentanyl were administered intravenously during a moderate sedation.   Electronically Signed   By: Jacqulynn Cadet M.D.   On: 08/15/2013 19:08   08/15/2013   CLINICAL DATA:  78 year old male with multifocal intra-abdominal abscesses status post laparoscopic cholecystectomy. He requires percutaneous drainage.  EXAM: CT GUIDED DRAINAGE OF gallbladder fossa/ perihepatic abscess ABSCESS; CT-guided drainage of perigastric abscess  ANESTHESIA/SEDATION: Versed said and fentanyl were administered intravenously.  Total Moderate Sedation Time:  40 minutes  PROCEDURE: The procedure, risks, benefits, and alternatives were explained to the patient. Questions regarding the procedure were encouraged and answered. The patient understands and consents to the procedure.  A planning axial CT scan was performed. Both the perigastric fluid collection in the mid epigastrium and the gallbladder fossa and perihepatic collection in the right upper quadrant were successfully identified. Suitable skin entry site was selected and marked.  The abdomen was prepped with Betadinein a sterile fashion, and a sterile drape was applied covering the operative field. A sterile gown and sterile gloves were used for the procedure. Local anesthesia was provided with 1% Lidocaine.  Attention was first turned to the right upper quadrant. Using intermittent CT fluoroscopic guidance, an 18 gauge trocar needle was carefully advanced over rib and superiorly over the colon and up into the gallbladder fossa  fluid collection. An 035 wire was coiled in the tract serially dilated to 12 [place]. A Cook 12 [place] all-purpose drainage catheter was advanced over the wire and formed within the fluid collection. Approximately 200 mL of purulent, brown, bilious fluid was aspirated. The drain was connected to JP bulb suction and sutured in place with a 0 silk suture.  Attention was next turned to the abscess collection anterior  to the gastric antrum in the mid epigastric region. Again, with intermittent CT fluoroscopic guidance a trocar needle was advanced into the fluid collection followed by a wire. The tract was dilated to 10 [place] and a [place] all-purpose drainage catheter was formed within the fluid collection. Approximately 70 mL of thick, frankly purulent fluid was aspirated. The cavity was lavaged with sterile normal saline and this was also aspirated. The catheter was secured to the skin with 0 silk suture and connected to JP bulb suction.  Post drain placement axial CT imaging demonstrates excellent drain positioning of a both catheters. The perigastric catheter is nearly completely resolved. There is some residual fluid anterior to the right hemi liver which should drain with the patient and in head up position. The patient tolerated the procedure well. There was no immediate complication. Samples of both fluid aspirates were sent for culture.  COMPLICATIONS: None  FINDINGS: As above  IMPRESSION: 1. Successful placement of a 12 French drain in the right upper quadrant/gallbladder fossa abscess collection with aspiration of 200 mL of purulent bilious fluid. A sample was sent for culture. The tube was left to JP bulb suction. 2. Successful placement of a 10 French drain in the mid epigastric/parepigastric abscess collection with aspiration of 70 mL L of thick, frankly purulent fluid. A sample was sent for culture. The tube was left to JP bulb suction. Signed,  Criselda Peaches, MD  [facility]  [facility] Radiology  Electronically Signed: By: Jacqulynn Cadet M.D. On: 08/15/2013 18:59    Anti-infectives: Anti-infectives   Start     Dose/Rate Route Frequency Ordered Stop   08/16/13 1100  vancomycin (VANCOCIN) 500 mg in sodium chloride 0.9 % 100 mL IVPB     500 mg 100 mL/hr over 60 Minutes Intravenous Every 12 hours 08/16/13 1016     08/15/13 1200  vancomycin (VANCOCIN) 1,250  mg in sodium chloride 0.9 % 250 mL IVPB  Status:  Discontinued     1,250 mg 166.7 mL/hr over 90 Minutes Intravenous Every 24 hours 08/14/13 1911 08/16/13 1016   08/15/13 0000  piperacillin-tazobactam (ZOSYN) IVPB 3.375 g     3.375 g 12.5 mL/hr over 240 Minutes Intravenous Every 8 hours 08/14/13 1911     08/14/13 1530  vancomycin (VANCOCIN) IVPB 1000 mg/200 mL premix     1,000 mg 200 mL/hr over 60 Minutes Intravenous  Once 08/14/13 1526 08/14/13 1823   08/14/13 1530  piperacillin-tazobactam (ZOSYN) IVPB 3.375 g     3.375 g 100 mL/hr over 30 Minutes Intravenous  Once 08/14/13 1526 08/14/13 1603      Assessment/Plan: s/p * No surgery found *  Continuing drains, IV antibiotics Try full liquids  LOS: 3 days    Mike Mack A 08/17/2013

## 2013-08-17 NOTE — Progress Notes (Signed)
[facility] for Infectious [facility]    Date of Admission:  08/14/2013   Total days of antibiotics 4        Day 4 piptazo        Day 4 vanco           ID: Mike Mack is a 78 y.o. male  with recent lap chole presents with fever, abdominal pain, leukocytosis found to have perihepatic abscess and enterococcal bacteremia. Bacteremia likely secondary to intra abdominal infection  Principal Problem:   Severe sepsis Active Problems:   Diabetes mellitus without complication   Hypertension   Stroke   Anemia   Right upper quadrant abdominal abscess   Hepatic abscess    Subjective: Afebrile, underwent TTE without difficulty, mild soreness at drain sites at RUQ  Medications:  . docusate sodium  100 mg Oral BID  . enoxaparin (LOVENOX) injection  40 mg Subcutaneous Q24H  . fluconazole (DIFLUCAN) IV  100 mg Intravenous Q24H  . insulin aspart  0-15 Units Subcutaneous TID WC  . insulin aspart  0-5 Units Subcutaneous QHS  . insulin glargine  5 Units Subcutaneous QHS  . latanoprost  1 drop Both Eyes QHS  . nystatin  5 mL Oral BID  . nystatin  5 mL Oral QID  . piperacillin-tazobactam (ZOSYN)  IV  3.375 g Intravenous Q8H  . polyethylene glycol  17 g Oral TID  . predniSONE  5 mg Oral Q breakfast  . tamsulosin  0.4 mg Oral BID  . vancomycin  500 mg Intravenous Q12H    Objective: Vital signs in last 24 hours: Temp:  [98 F (36.7 C)-98.6 F (37 C)] 98.1 F (36.7 C) (03/01 0800) Pulse Rate:  [77-95] 95 (03/01 0800) Resp:  [11-22] 22 (03/01 0800) BP: (109-153)/(61-73) 153/73 mmHg (03/01 0800) SpO2:  [96 %-99 %] 96 % (03/01 0800) Weight:  [143 lb 11.8 oz (65.2 kg)] 143 lb 11.8 oz (65.2 kg) (03/01 0500)  General: No acute respiratory distress, elderly male, chronically ill appearing  Lungs: Clear to auscultation bilaterally without wheezes or crackles  Cardiovascular: Regular rate and rhythm without murmur gallop or rub normal S1 and S2  Abdomen: RUQ drain with blood tinged  fluid, nondistended, soft, bowel sounds positive, no rebound Extremities: No significant cyanosis, clubbing, or edema bilateral lower extremities, wearing soft boots  Lab Results  Recent Labs  08/15/13 0233 08/15/13 1836 08/16/13 0245 08/17/13 0905  WBC 19.0*  --  11.8* 11.9*  HGB 8.7*  --  7.3* 8.2*  HCT 26.0*  --  22.0* 25.1*  NA 140 144  --   --   K 4.8 4.2  --   --   CL 106 107  --   --   CO2 26 26  --   --   BUN 29* 31*  --   --   CREATININE 1.04 1.18  --   --    Liver Panel  Recent Labs  08/14/13 1345 08/15/13 0233  PROT 7.5 6.6  ALBUMIN 2.1* 1.8*  AST 60* 57*  ALT 45 43  ALKPHOS 281* 246*  BILITOT 0.6 0.4    Microbiology: 2/27 fluid cx reincubation 2/26 blood cx x 2 enterococcus     Studies/Results: Ct Image Guided Drainage Percut Cath  Peritoneal Retroperit  08/15/2013   ADDENDUM REPORT: 08/15/2013 19:08  ADDENDUM: 3 mg Versed said and 100 mcg fentanyl were administered intravenously during a moderate sedation.   Electronically Signed   By: Dellis Filbert.D.  On: 08/15/2013 19:08   08/15/2013   CLINICAL DATA:  78 year old male with multifocal intra-abdominal abscesses status post laparoscopic cholecystectomy. He requires percutaneous drainage.  EXAM: CT GUIDED DRAINAGE OF gallbladder fossa/ perihepatic abscess ABSCESS; CT-guided drainage of perigastric abscess  ANESTHESIA/SEDATION: Versed said and fentanyl were administered intravenously.  Total Moderate Sedation Time:  40 minutes  PROCEDURE: The procedure, risks, benefits, and alternatives were explained to the patient. Questions regarding the procedure were encouraged and answered. The patient understands and consents to the procedure.  A planning axial CT scan was performed. Both the perigastric fluid collection in the mid epigastrium and the gallbladder fossa and perihepatic collection in the right upper quadrant were successfully identified. Suitable skin entry site was selected and marked.  The abdomen  was prepped with Betadinein a sterile fashion, and a sterile drape was applied covering the operative field. A sterile gown and sterile gloves were used for the procedure. Local anesthesia was provided with 1% Lidocaine.  Attention was first turned to the right upper quadrant. Using intermittent CT fluoroscopic guidance, an 18 gauge trocar needle was carefully advanced over rib and superiorly over the colon and up into the gallbladder fossa fluid collection. An 035 wire was coiled in the tract serially dilated to 12 [place]. A Cook 12 [place] all-purpose drainage catheter was advanced over the wire and formed within the fluid collection. Approximately 200 mL of purulent, brown, bilious fluid was aspirated. The drain was connected to JP bulb suction and sutured in place with a 0 silk suture.  Attention was next turned to the abscess collection anterior to the gastric antrum in the mid epigastric region. Again, with intermittent CT fluoroscopic guidance a trocar needle was advanced into the fluid collection followed by a wire. The tract was dilated to 10 [place] and a [place] all-purpose drainage catheter was formed within the fluid collection. Approximately 70 mL of thick, frankly purulent fluid was aspirated. The cavity was lavaged with sterile normal saline and this was also aspirated. The catheter was secured to the skin with 0 silk suture and connected to JP bulb suction.  Post drain placement axial CT imaging demonstrates excellent drain positioning of a both catheters. The perigastric catheter is nearly completely resolved. There is some residual fluid anterior to the right hemi liver which should drain with the patient and in head up position. The patient tolerated the procedure well. There was no immediate complication. Samples of both fluid aspirates were sent for culture.  COMPLICATIONS: None  FINDINGS: As above  IMPRESSION: 1. Successful placement of a 12 French drain in the right upper  quadrant/gallbladder fossa abscess collection with aspiration of 200 mL of purulent bilious fluid. A sample was sent for culture. The tube was left to JP bulb suction. 2. Successful placement of a 10 French drain in the mid epigastric/parepigastric abscess collection with aspiration of 70 mL L of thick, frankly purulent fluid. A sample was sent for culture. The tube was left to JP bulb suction. Signed,  Criselda Peaches, MD  [facility]  [facility] Radiology  Electronically Signed: By: Jacqulynn Cadet M.D. On: 08/15/2013 18:59   Ct Image Guided Drainage Percut Cath  Peritoneal Retroperit  08/15/2013   ADDENDUM REPORT: 08/15/2013 19:08  ADDENDUM: 3 mg Versed said and 100 mcg fentanyl were administered intravenously during a moderate sedation.   Electronically Signed   By: Jacqulynn Cadet M.D.   On: 08/15/2013 19:08   08/15/2013   CLINICAL DATA:  78 year old  male with multifocal intra-abdominal abscesses status post laparoscopic cholecystectomy. He requires percutaneous drainage.  EXAM: CT GUIDED DRAINAGE OF gallbladder fossa/ perihepatic abscess ABSCESS; CT-guided drainage of perigastric abscess  ANESTHESIA/SEDATION: Versed said and fentanyl were administered intravenously.  Total Moderate Sedation Time:  40 minutes  PROCEDURE: The procedure, risks, benefits, and alternatives were explained to the patient. Questions regarding the procedure were encouraged and answered. The patient understands and consents to the procedure.  A planning axial CT scan was performed. Both the perigastric fluid collection in the mid epigastrium and the gallbladder fossa and perihepatic collection in the right upper quadrant were successfully identified. Suitable skin entry site was selected and marked.  The abdomen was prepped with Betadinein a sterile fashion, and a sterile drape was applied covering the operative field. A sterile gown and sterile gloves were used for the procedure. Local  anesthesia was provided with 1% Lidocaine.  Attention was first turned to the right upper quadrant. Using intermittent CT fluoroscopic guidance, an 18 gauge trocar needle was carefully advanced over rib and superiorly over the colon and up into the gallbladder fossa fluid collection. An 035 wire was coiled in the tract serially dilated to 12 [place]. A Cook 12 [place] all-purpose drainage catheter was advanced over the wire and formed within the fluid collection. Approximately 200 mL of purulent, brown, bilious fluid was aspirated. The drain was connected to JP bulb suction and sutured in place with a 0 silk suture.  Attention was next turned to the abscess collection anterior to the gastric antrum in the mid epigastric region. Again, with intermittent CT fluoroscopic guidance a trocar needle was advanced into the fluid collection followed by a wire. The tract was dilated to 10 [place] and a [place] all-purpose drainage catheter was formed within the fluid collection. Approximately 70 mL of thick, frankly purulent fluid was aspirated. The cavity was lavaged with sterile normal saline and this was also aspirated. The catheter was secured to the skin with 0 silk suture and connected to JP bulb suction.  Post drain placement axial CT imaging demonstrates excellent drain positioning of a both catheters. The perigastric catheter is nearly completely resolved. There is some residual fluid anterior to the right hemi liver which should drain with the patient and in head up position. The patient tolerated the procedure well. There was no immediate complication. Samples of both fluid aspirates were sent for culture.  COMPLICATIONS: None  FINDINGS: As above  IMPRESSION: 1. Successful placement of a 12 French drain in the right upper quadrant/gallbladder fossa abscess collection with aspiration of 200 mL of purulent bilious fluid. A sample was sent for culture. The tube was left to JP bulb suction. 2. Successful placement  of a 10 French drain in the mid epigastric/parepigastric abscess collection with aspiration of 70 mL L of thick, frankly purulent fluid. A sample was sent for culture. The tube was left to JP bulb suction. Signed,  Criselda Peaches, MD  [facility]  [facility] Radiology  Electronically Signed: By: Jacqulynn Cadet M.D. On: 08/15/2013 18:59     Assessment/Plan:  78yo M with recent lap chole presents with fever, abdominal pain, leukocytosis found to have perihepatic abscess and enterococcal bacteremia. Bacteremia likely secondary to intra abdominal infection   - continue on vancomycin and piptazo for now  - will follow up on cx from intra abdominal infection  - await repeat blood cx to see if still has ongoing bacteremia to pursue TEE - wait on  getting picc line until we have documented clearance of his bacteremia - will likely need prolonged IV antibiotics of 4-6 wks for intra abd abscess then convert to oral antibotics and follow with serial imaging.   Mike Mack [facility] for Infectious Diseases Cell: [phone] Pager: [phone]  08/17/2013, 10:47 AM

## 2013-08-17 NOTE — Progress Notes (Signed)
[facility] Progress Note [place] TEAM 1 - Stepdown/ICU TEAM   Mike Mack [id number] DOB: [birth year] DOA: 08/14/2013 PCP: [name], MD  Brief narrative: Mike Mack is a 78 y.o. male presenting on 08/14/2013 with HTN, HLD, prior stroke, recent laparoscopic cholecystectomy 07/25/2013 by [name], and discharged to [facility] 07/30/2013, now presented to [facility] ED with main concern of several days duration of persistent epigastric abdominal pain, throbbing and 5/10 in severity, non radiating, associated with fevers, chills, malaise, poor oral intake, no specific aggravating or alleviating factors. No similar events in the past. Please note that pt is poor historian and family at bedside helped with providing history. There has been reported exertional shortness of breath and chest tightness but details not available. Pt currently denies chest pain or shortness of breath.  In ED, CT abdomen notable for abscess in the gallbladder bed, hepatic abscess, and an additional fluid collection in the abdomen. Surgery consulted and [facility] asked to admit for further evaluation. Pt started on Vancomycin and Zosyn in ED.   Subjective: C/o in abdomen. C/o pain with swallowing.  Assessment/Plan: Principal Problem:   Severe sepsis/  Hepatic abscess/  Right upper quadrant abdominal abscess - post surgical after cholecystectomy- now with 2 perc drains - bacteremia with enterococcus- f/u sensitivities  Active Problems:   Diabetes mellitus with Neuropathy - cont lantus and sliding scale    Hypertension - held ARB due to dehydration - BP stable for now    H/o Stroke - med rec does not mention ASA or Statin    Anemia - follow   Chronic steroid use - no need for stress dose steroids currently  Thrush - cont Nystatin - has sore throat as well which may be consistent with candida esophagitis - add Diflucan today  Dysphagia - likely due to above- follow with Diflucan   BPH -  Flomax  Elevated INR - due to poor nutrition?- given 1 U FFP prior to procedure and Vit K   Code Status: Full code Family Communication: with wife on 2/28 Disposition Plan: follow in SDU  Consultants: Surg  IR  Procedures: none  Antibiotics: Antibiotics Given (last 72 hours)   Date/Time Action Medication Dose Rate   08/15/13 0126 Given   piperacillin-tazobactam (ZOSYN) IVPB 3.375 g 3.375 g 12.5 mL/hr   08/15/13 1059 Given   piperacillin-tazobactam (ZOSYN) IVPB 3.375 g 3.375 g 12.5 mL/hr   08/15/13 1313 Given   vancomycin (VANCOCIN) 1,250 mg in sodium chloride 0.9 % 250 mL IVPB 1,250 mg 166.7 mL/hr   08/15/13 1841 Given   piperacillin-tazobactam (ZOSYN) IVPB 3.375 g 3.375 g 12.5 mL/hr   08/15/13 2348 Given   piperacillin-tazobactam (ZOSYN) IVPB 3.375 g 3.375 g 12.5 mL/hr   08/16/13 0809 Given   piperacillin-tazobactam (ZOSYN) IVPB 3.375 g 3.375 g 12.5 mL/hr   08/16/13 1118 Given   vancomycin (VANCOCIN) 500 mg in sodium chloride 0.9 % 100 mL IVPB 500 mg 100 mL/hr   08/16/13 1631 Given   piperacillin-tazobactam (ZOSYN) IVPB 3.375 g 3.375 g 12.5 mL/hr   08/16/13 2216 Given   vancomycin (VANCOCIN) 500 mg in sodium chloride 0.9 % 100 mL IVPB 500 mg 100 mL/hr   08/17/13 0008 Given   piperacillin-tazobactam (ZOSYN) IVPB 3.375 g 3.375 g 12.5 mL/hr   08/17/13 0737 Given   piperacillin-tazobactam (ZOSYN) IVPB 3.375 g 3.375 g 12.5 mL/hr   08/17/13 1237 Given   vancomycin (VANCOCIN) 500 mg in sodium chloride 0.9 % 100 mL IVPB 500 mg 100 mL/hr  DVT prophylaxis: Lovenox  Objective: Filed Weights   08/15/13 0500 08/16/13 0400 08/17/13 0500  Weight: 61.1 kg (134 lb 11.2 oz) 61.3 kg (135 lb 2.3 oz) 65.2 kg (143 lb 11.8 oz)   Blood pressure 153/73, pulse 95, temperature 98.2 F (36.8 C), temperature source Oral, resp. rate 22, height 5\' 5"  (1.651 m), weight 65.2 kg (143 lb 11.8 oz), SpO2 96.00%.  Intake/Output Summary (Last 24 hours) at 08/17/13 1307 Last data filed at  08/17/13 1200  Gross per 24 hour  Intake   1155 ml  Output   1595 ml  Net   -440 ml     Exam: General: No acute respiratory distress-  Lungs: Clear to auscultation bilaterally without wheezes or crackles Cardiovascular: Regular rate and rhythm without murmur gallop or rub normal S1 and S2- tachycardic SR in 130s Abdomen: RUQ drain with blood tinged fluid- other drain with pus, nondistended, soft, bowel sounds positive, no rebound, no ascites, no appreciable mass Extremities: No significant cyanosis, clubbing, or edema bilateral lower extremities  Data Reviewed: Basic Metabolic Panel:  Recent Labs Lab 08/14/13 1345 08/14/13 2200 08/15/13 0233 08/15/13 1836  NA 136*  --  140 144  K 5.2  --  4.8 4.2  CL 97  --  106 107  CO2 23  --  26 26  GLUCOSE 290*  --  238* 218*  BUN 31*  --  29* 31*  CREATININE 1.06  --  1.04 1.18  CALCIUM 8.6  --  8.0* 8.1*  MG  --  2.1  --   --   PHOS  --  3.1  --   --    Liver Function Tests:  Recent Labs Lab 08/14/13 1345 08/15/13 0233  AST 60* 57*  ALT 45 43  ALKPHOS 281* 246*  BILITOT 0.6 0.4  PROT 7.5 6.6  ALBUMIN 2.1* 1.8*   No results found for this basename: LIPASE, AMYLASE,  in the last 168 hours No results found for this basename: AMMONIA,  in the last 168 hours CBC:  Recent Labs Lab 08/14/13 1345 08/15/13 0233 08/16/13 0245 08/17/13 0905  WBC 20.9* 19.0* 11.8* 11.9*  NEUTROABS 20.4*  --   --   --   HGB 9.8* 8.7* 7.3* 8.2*  HCT 30.0* 26.0* 22.0* 25.1*  MCV 81.7 81.0 80.6 80.7  PLT 311 254 180 223   Cardiac Enzymes:  Recent Labs Lab 08/14/13 1345  TROPONINI <0.30   BNP (last 3 results) No results found for this basename: PROBNP,  in the last 8760 hours CBG:  Recent Labs Lab 08/16/13 1209 08/16/13 1638 08/16/13 2057 08/17/13 0813 08/17/13 1140  GLUCAP 130* 147* 110* 116* 152*    Recent Results (from the past 240 hour(s))  URINE CULTURE     Status: None   Collection Time    08/14/13 12:17 AM       Result Value Ref Range Status   Specimen Description URINE, CATHETERIZED   Final   Special Requests NONE   Final   Culture  Setup Time     Final   Value: 08/15/2013 03:29     Performed at [facility]     Final   Value: NO GROWTH     Performed at [facility]   Culture     Final   Value: NO GROWTH     Performed at [facility]   Report Status 08/16/2013 FINAL   Final  CULTURE, BLOOD (ROUTINE X 2)  Status: None   Collection Time    08/14/13  2:38 PM      Result Value Ref Range Status   Specimen Description BLOOD ARM LEFT   Final   Special Requests BOTTLES DRAWN AEROBIC AND ANAEROBIC 5CC   Final   Culture  Setup Time     Final   Value: 08/14/2013 20:43     Performed at [facility]   Culture     Final   Value: ENTEROCOCCUS SPECIES     Note: COMBINATION THERAPY OF HIGH DOSE AMPICILLIN OR VANCOMYCIN, PLUS AN AMINOGLYCOSIDE, IS USUALLY INDICATED FOR SERIOUS ENTEROCOCCAL INFECTIONS.     GRAM NEGATIVE RODS     Note: Gram Stain Report Called to,Read Back By and Verified With: MORGAN SCHMITZ 08/15/12 0815 BY SMITHERSJ CRITICAL RESULT CALLED TO, READ BACK BY AND VERIFIED WITH: ROSEMARY CLARK 2/29/15 @ 10:46AM BY RUSCOE A.     Performed at [facility]   Report Status PENDING   Incomplete   Organism ID, Bacteria ENTEROCOCCUS SPECIES   Final  CULTURE, BLOOD (ROUTINE X 2)     Status: None   Collection Time    08/14/13  2:52 PM      Result Value Ref Range Status   Specimen Description BLOOD RIGHT FOREARM   Final   Special Requests BOTTLES DRAWN AEROBIC AND ANAEROBIC 5CC   Final   Culture  Setup Time     Final   Value: 08/14/2013 20:43     Performed at [facility]   Culture     Final   Value: ENTEROCOCCUS SPECIES     Note: SUSCEPTIBILITIES PERFORMED ON PREVIOUS CULTURE WITHIN THE LAST 5 DAYS.     Note: Gram Stain Report Called to,Read Back By and Verified With: Drema Dallas 08/15/13 0815 BY SMITHERSJ     Performed at  [facility]   Report Status 08/17/2013 FINAL   Final  RAPID STREP SCREEN     Status: None   Collection Time    08/15/13 11:42 AM      Result Value Ref Range Status   Streptococcus, Group A Screen (Direct) NEGATIVE  NEGATIVE Final   Comment: (NOTE)     A Rapid Antigen test may result negative if the antigen level in the     sample is below the detection level of this test. The FDA has not     cleared this test as a stand-alone test therefore the rapid antigen     negative result has reflexed to a Group A Strep culture.  CULTURE, GROUP A STREP     Status: None   Collection Time    08/15/13 11:42 AM      Result Value Ref Range Status   Specimen Description THROAT   Final   Special Requests NONE   Final   Culture     Final   Value: No Beta Hemolytic Streptococci Isolated     Performed at [facility]   Report Status 08/17/2013 FINAL   Final  CULTURE, ROUTINE-ABSCESS     Status: None   Collection Time    08/15/13  6:17 PM      Result Value Ref Range Status   Specimen Description ABSCESS GALL BLADDER   Final   Special Requests RUQ IN THE GB FOSSA,SPECIMEN NO 1   Final   Gram Stain     Final   Value: FEW WBC PRESENT,BOTH PMN AND MONONUCLEAR     NO SQUAMOUS EPITHELIAL CELLS SEEN  RARE GRAM POSITIVE COCCI IN PAIRS     RARE GRAM POSITIVE RODS     Performed at [facility]   Culture     Final   Value: Culture reincubated for better growth     Performed at [facility]   Report Status PENDING   Incomplete  CULTURE, ROUTINE-ABSCESS     Status: None   Collection Time    08/15/13  6:52 PM      Result Value Ref Range Status   Specimen Description ABSCESS   Final   Special Requests     Final   Value: MID EPIGASTRUM IN THE ANTERIOR PERI GASTRIC SAMPLE NO 2   Gram Stain     Final   Value: ABUNDANT WBC PRESENT, PREDOMINANTLY PMN     NO SQUAMOUS EPITHELIAL CELLS SEEN     ABUNDANT GRAM POSITIVE COCCI IN PAIRS     Performed at [facility]    Culture     Final   Value: Culture reincubated for better growth     Performed at [facility]   Report Status PENDING   Incomplete     Studies:  Recent x-ray studies have been reviewed in detail by the Attending Physician  Scheduled Meds:  Scheduled Meds: . docusate sodium  100 mg Oral BID  . enoxaparin (LOVENOX) injection  40 mg Subcutaneous Q24H  . fluconazole (DIFLUCAN) IV  100 mg Intravenous Q24H  . insulin aspart  0-15 Units Subcutaneous TID WC  . insulin aspart  0-5 Units Subcutaneous QHS  . insulin glargine  5 Units Subcutaneous QHS  . latanoprost  1 drop Both Eyes QHS  . nystatin  5 mL Oral BID  . nystatin  5 mL Oral QID  . piperacillin-tazobactam (ZOSYN)  IV  3.375 g Intravenous Q8H  . polyethylene glycol  17 g Oral TID  . predniSONE  5 mg Oral Q breakfast  . tamsulosin  0.4 mg Oral BID  . vancomycin  500 mg Intravenous Q12H   Continuous Infusions: . sodium chloride 75 mL/hr (08/17/13 0954)    Time spent on care of this patient: 25 min   [place], MD  [facility] Office  [phone] Pager - Text Page per Shea Evans as per below:  On-Call/Text Page:      [email or web address]      password [email or web address]  If 7PM-7AM, please contact night-coverage [email or web address] Password [email or web address] 08/17/2013, 1:07 PM   LOS: 3 days

## 2013-08-17 NOTE — Progress Notes (Signed)
Clinical Social Work Department BRIEF PSYCHOSOCIAL ASSESSMENT 08/17/2013  Patient:  Mike Mack, Mike Mack     Account Number:  [id number]     Admit date:  08/14/2013  Clinical Social Worker:  Adair Laundry  Date/Time:  08/17/2013 02:00 PM  Referred by:  Physician  Date Referred:  08/17/2013 Referred for  SNF Placement   Other Referral:   Interview type:  Family Other interview type:   Spoke with pt wife at bedside    PSYCHOSOCIAL DATA Living Status:  FACILITY Admitted from facility:  [place] [place] Level of care:  [place] Primary support name:  Mike Mack [phone] Primary support relationship to patient:  SPOUSE Degree of support available:   Pt has good amount of support available    CURRENT CONCERNS Current Concerns  Post-Acute Placement   Other Concerns:    SOCIAL WORK ASSESSMENT / PLAN CSW informed that pt was admitted from facility. CSW visited pt room and pt wife was present at bedside. CSW completed assessment with pt wife as pt was present but not oreinted x4 and communicating very little. Pt wife informed CSW that pt was admitted from [facility]. Pt wife did not express any dissatisfaction with facility but seemed hesitatnt to pt returning. CSW asked what pt and pt family would like for plan to be. Pt wife is hoping for pt to go to [facility]. CSW explained that PT recommendation and briefly explained the difference between ST rehab at [facility] and [facility]. Pt wife wanting CSW to please note that they highly prefer [facility]. CSW asked what they would like plan to be if pt cannot go to [facility]. Pt wife said she would want to discuss further with pt but at this time they are agreeable to pt being faxed out to all facilities in [facility] in case they do decide they would like a facility other than [facility]. Pt wife is wanting what will be best for pt and informed CSW that pt was able to move around 100% independently  prior to "all of this happening" and she just wants for pt to be able to return to that. CSW to keep pt and pt wife updated as PT recommendation becomes available.   Assessment/plan status:  Psychosocial Support/Ongoing Assessment of Needs Other assessment/ plan:   Information/referral to community resources:   SNF list to be provided with bed offers    PATIENT'S/FAMILY'S RESPONSE TO PLAN OF CARE: Pt wife hoping for dc to [facility] and unsure of dc plan otherwise.[place], [place]

## 2013-08-17 NOTE — Progress Notes (Signed)
Subjective: Pt feeling sore at drain sites, but overall some better.   Objective: Physical Exam: BP 153/73  Pulse 95  Temp(Src) 98.1 F (36.7 C) (Oral)  Resp 22  Ht 5\' 5"  (1.651 m)  Wt 143 lb 11.8 oz (65.2 kg)  BMI 23.92 kg/m2  SpO2 96% RUQ drains intact, sites clean, NT #1 drain(anterior) with purulent output 59mL $2 drain (lateral) with serous output 7.25mL   Labs: CBC  Recent Labs  08/15/13 0233 08/16/13 0245  WBC 19.0* 11.8*  HGB 8.7* 7.3*  HCT 26.0* 22.0*  PLT 254 180   BMET  Recent Labs  08/15/13 0233 08/15/13 1836  NA 140 144  K 4.8 4.2  CL 106 107  CO2 26 26  GLUCOSE 238* 218*  BUN 29* 31*  CREATININE 1.04 1.18  CALCIUM 8.0* 8.1*   LFT  Recent Labs  08/15/13 0233  PROT 6.6  ALBUMIN 1.8*  AST 57*  ALT 43  ALKPHOS 246*  BILITOT 0.4   PT/INR  Recent Labs  08/15/13 1230 08/16/13 1005  LABPROT 19.5* 18.7*  INR 1.70* 1.61*     Studies/Results: Nm Hepatobiliary Liver Func  08/15/2013   CLINICAL DATA:  Abdominal pain, post cholecystectomy.  EXAM: NUCLEAR MEDICINE HEPATOBILIARY IMAGING  TECHNIQUE: Sequential images of the abdomen were obtained out to 60 minutes following intravenous administration of radiopharmaceutical.  COMPARISON:  Abdominal CT 08/14/2013  RADIOPHARMACEUTICALS:  5 mCi Tc-74m Choletec  FINDINGS: The radiopharmaceutical was taken up by the liver and excreted into the biliary system. There are areas of photopenia involving the central right hepatic lobe related to the known fluid or abscess collections. There is no evidence for a bile leak. Activity is identified in the small bowel.  IMPRESSION: No evidence for a bile leak.  Photopenia areas in the liver are most compatible with known fluid or abscess collections.   Electronically Signed   By: Markus Daft M.D.   On: 08/15/2013 09:34   Ct Image Guided Drainage Percut Cath  Peritoneal Retroperit  08/15/2013   ADDENDUM REPORT: 08/15/2013 19:08  ADDENDUM: 3 mg Versed said and 100  mcg fentanyl were administered intravenously during a moderate sedation.   Electronically Signed   By: Jacqulynn Cadet M.D.   On: 08/15/2013 19:08   08/15/2013   CLINICAL DATA:  78 year old male with multifocal intra-abdominal abscesses status post laparoscopic cholecystectomy. He requires percutaneous drainage.  EXAM: CT GUIDED DRAINAGE OF gallbladder fossa/ perihepatic abscess ABSCESS; CT-guided drainage of perigastric abscess  ANESTHESIA/SEDATION: Versed said and fentanyl were administered intravenously.  Total Moderate Sedation Time:  40 minutes  PROCEDURE: The procedure, risks, benefits, and alternatives were explained to the patient. Questions regarding the procedure were encouraged and answered. The patient understands and consents to the procedure.  A planning axial CT scan was performed. Both the perigastric fluid collection in the mid epigastrium and the gallbladder fossa and perihepatic collection in the right upper quadrant were successfully identified. Suitable skin entry site was selected and marked.  The abdomen was prepped with Betadinein a sterile fashion, and a sterile drape was applied covering the operative field. A sterile gown and sterile gloves were used for the procedure. Local anesthesia was provided with 1% Lidocaine.  Attention was first turned to the right upper quadrant. Using intermittent CT fluoroscopic guidance, an 18 gauge trocar needle was carefully advanced over rib and superiorly over the colon and up into the gallbladder fossa fluid collection. An 035 wire was coiled in the tract serially dilated to 12 [place].  A Cook 12 [place] all-purpose drainage catheter was advanced over the wire and formed within the fluid collection. Approximately 200 mL of purulent, brown, bilious fluid was aspirated. The drain was connected to JP bulb suction and sutured in place with a 0 silk suture.  Attention was next turned to the abscess collection anterior to the gastric antrum in the mid  epigastric region. Again, with intermittent CT fluoroscopic guidance a trocar needle was advanced into the fluid collection followed by a wire. The tract was dilated to 10 [place] and a [place] all-purpose drainage catheter was formed within the fluid collection. Approximately 70 mL of thick, frankly purulent fluid was aspirated. The cavity was lavaged with sterile normal saline and this was also aspirated. The catheter was secured to the skin with 0 silk suture and connected to JP bulb suction.  Post drain placement axial CT imaging demonstrates excellent drain positioning of a both catheters. The perigastric catheter is nearly completely resolved. There is some residual fluid anterior to the right hemi liver which should drain with the patient and in head up position. The patient tolerated the procedure well. There was no immediate complication. Samples of both fluid aspirates were sent for culture.  COMPLICATIONS: None  FINDINGS: As above  IMPRESSION: 1. Successful placement of a 12 French drain in the right upper quadrant/gallbladder fossa abscess collection with aspiration of 200 mL of purulent bilious fluid. A sample was sent for culture. The tube was left to JP bulb suction. 2. Successful placement of a 10 French drain in the mid epigastric/parepigastric abscess collection with aspiration of 70 mL L of thick, frankly purulent fluid. A sample was sent for culture. The tube was left to JP bulb suction. Signed,  Criselda Peaches, MD  [facility]  [facility]  Electronically Signed: By: Jacqulynn Cadet M.D. On: 08/15/2013 18:59   Ct Image Guided Drainage Percut Cath  Peritoneal Retroperit  08/15/2013   ADDENDUM REPORT: 08/15/2013 19:08  ADDENDUM: 3 mg Versed said and 100 mcg fentanyl were administered intravenously during a moderate sedation.   Electronically Signed   By: Jacqulynn Cadet M.D.   On: 08/15/2013 19:08   08/15/2013   CLINICAL DATA:   77 year old male with multifocal intra-abdominal abscesses status post laparoscopic cholecystectomy. He requires percutaneous drainage.  EXAM: CT GUIDED DRAINAGE OF gallbladder fossa/ perihepatic abscess ABSCESS; CT-guided drainage of perigastric abscess  ANESTHESIA/SEDATION: Versed said and fentanyl were administered intravenously.  Total Moderate Sedation Time:  40 minutes  PROCEDURE: The procedure, risks, benefits, and alternatives were explained to the patient. Questions regarding the procedure were encouraged and answered. The patient understands and consents to the procedure.  A planning axial CT scan was performed. Both the perigastric fluid collection in the mid epigastrium and the gallbladder fossa and perihepatic collection in the right upper quadrant were successfully identified. Suitable skin entry site was selected and marked.  The abdomen was prepped with Betadinein a sterile fashion, and a sterile drape was applied covering the operative field. A sterile gown and sterile gloves were used for the procedure. Local anesthesia was provided with 1% Lidocaine.  Attention was first turned to the right upper quadrant. Using intermittent CT fluoroscopic guidance, an 18 gauge trocar needle was carefully advanced over rib and superiorly over the colon and up into the gallbladder fossa fluid collection. An 035 wire was coiled in the tract serially dilated to 12 [place]. A Cook 12 [place] all-purpose drainage catheter was advanced over the wire  and formed within the fluid collection. Approximately 200 mL of purulent, brown, bilious fluid was aspirated. The drain was connected to JP bulb suction and sutured in place with a 0 silk suture.  Attention was next turned to the abscess collection anterior to the gastric antrum in the mid epigastric region. Again, with intermittent CT fluoroscopic guidance a trocar needle was advanced into the fluid collection followed by a wire. The tract was dilated to 10 [place] and a [place] all-purpose drainage catheter was formed within the fluid collection. Approximately 70 mL of thick, frankly purulent fluid was aspirated. The cavity was lavaged with sterile normal saline and this was also aspirated. The catheter was secured to the skin with 0 silk suture and connected to JP bulb suction.  Post drain placement axial CT imaging demonstrates excellent drain positioning of a both catheters. The perigastric catheter is nearly completely resolved. There is some residual fluid anterior to the right hemi liver which should drain with the patient and in head up position. The patient tolerated the procedure well. There was no immediate complication. Samples of both fluid aspirates were sent for culture.  COMPLICATIONS: None  FINDINGS: As above  IMPRESSION: 1. Successful placement of a 12 French drain in the right upper quadrant/gallbladder fossa abscess collection with aspiration of 200 mL of purulent bilious fluid. A sample was sent for culture. The tube was left to JP bulb suction. 2. Successful placement of a 10 French drain in the mid epigastric/parepigastric abscess collection with aspiration of 70 mL L of thick, frankly purulent fluid. A sample was sent for culture. The tube was left to JP bulb suction. Signed,  Criselda Peaches, MD  [facility]  [facility]  Electronically Signed: By: Jacqulynn Cadet M.D. On: 08/15/2013 18:59    Assessment/Plan: S/p RUQ abscess drains X 2 WBC down Cx pending Cont drain flushes     LOS: 3 days    Ascencion Dike PA-C 08/17/2013 9:06 AM

## 2013-08-17 NOTE — Progress Notes (Signed)
Clinical Social Work Department CLINICAL SOCIAL WORK PLACEMENT NOTE 08/17/2013  Patient:  Mike Mack, Mike Mack  Account Number:  [id number] Admit date:  08/14/2013  Clinical Social Worker:  Adair Laundry  Date/time:  08/17/2013 03:30 PM  Clinical Social Work is seeking post-discharge placement for this patient at the following level of care:   SKILLED NURSING   (*CSW will update this form in Epic as items are completed)   08/17/2013  Patient/family provided with [place] Department of Clinical Social Work's list of facilities offering this level of care within the geographic area requested by the patient (or if unable, by the patient's family).  08/17/2013  Patient/family informed of their freedom to choose among providers that offer the needed level of care, that participate in Medicare, Medicaid or managed care program needed by the patient, have an available bed and are willing to accept the patient.  08/17/2013  Patient/family informed of [facility]' ownership interest in [facility], as well as of the fact that they are under no obligation to receive care at this facility.  PASARR submitted to EDS on EXISTING PASARR number received from EDS on   FL2 transmitted to all facilities in geographic area requested by pt/family on  08/17/2013 FL2 transmitted to all facilities within larger geographic area on   Patient informed that his/her managed care company has contracts with or will negotiate with  certain facilities, including the following:     Patient/family informed of bed offers received:   Patient chooses bed at  Physician recommends and patient chooses bed at    Patient to be transferred to  on   Patient to be transferred to facility by   The following physician request were entered in Epic:   Additional CommentsBerton Mount, [place]

## 2013-08-18 ENCOUNTER — Other Ambulatory Visit: Payer: Medicare Other

## 2013-08-18 DIAGNOSIS — L03319 Cellulitis of trunk, unspecified: Secondary | ICD-10-CM

## 2013-08-18 DIAGNOSIS — L02219 Cutaneous abscess of trunk, unspecified: Secondary | ICD-10-CM

## 2013-08-18 DIAGNOSIS — B964 Proteus (mirabilis) (morganii) as the cause of diseases classified elsewhere: Secondary | ICD-10-CM

## 2013-08-18 LAB — CULTURE, BLOOD (ROUTINE X 2)

## 2013-08-18 LAB — BASIC METABOLIC PANEL
BUN: 16 mg/dL (ref 6–23)
CALCIUM: 7.9 mg/dL — AB (ref 8.4–10.5)
CO2: 23 mEq/L (ref 19–32)
CREATININE: 0.87 mg/dL (ref 0.50–1.35)
Chloride: 106 mEq/L (ref 96–112)
GFR, EST NON AFRICAN AMERICAN: 82 mL/min — AB (ref >90)
Glucose, Bld: 126 mg/dL — ABNORMAL HIGH (ref 70–99)
Potassium: 3.8 mEq/L (ref 3.7–5.3)
Sodium: 138 mEq/L (ref 137–147)

## 2013-08-18 LAB — APTT: APTT: 37 s (ref 24–37)

## 2013-08-18 LAB — GLUCOSE, CAPILLARY
GLUCOSE-CAPILLARY: 140 mg/dL — AB (ref 70–99)
GLUCOSE-CAPILLARY: 113 mg/dL — AB (ref 70–99)
Glucose-Capillary: 120 mg/dL — ABNORMAL HIGH (ref 70–99)
Glucose-Capillary: 116 mg/dL — ABNORMAL HIGH (ref 70–99)
Glucose-Capillary: 115 mg/dL — ABNORMAL HIGH (ref 70–99)

## 2013-08-18 LAB — CBC
HCT: 23.6 % — ABNORMAL LOW (ref 39.0–52.0)
Hemoglobin: 7.8 g/dL — ABNORMAL LOW (ref 13.0–17.0)
MCH: 26.4 pg (ref 26.0–34.0)
MCHC: 33.1 g/dL (ref 30.0–36.0)
MCV: 79.7 fL (ref 78.0–100.0)
Platelets: 223 10*3/uL (ref 150–400)
RBC: 2.96 MIL/uL — ABNORMAL LOW (ref 4.22–5.81)
RDW: 17.2 % — AB (ref 11.5–15.5)
WBC: 10.3 10*3/uL (ref 4.0–10.5)

## 2013-08-18 LAB — PROTIME-INR
INR: 1.34 (ref 0.00–1.49)
PROTHROMBIN TIME: 16.3 s — AB (ref 11.6–15.2)

## 2013-08-18 MED ORDER — SODIUM CHLORIDE 0.9 % IV SOLN
2.0000 g | Freq: Four times a day (QID) | INTRAVENOUS | Status: DC
  Administered 2013-08-18 – 2013-08-25 (×28): 2 g via INTRAVENOUS
  Filled 2013-08-18 (×32): qty 2000

## 2013-08-18 MED ORDER — LEVALBUTEROL HCL 0.63 MG/3ML IN NEBU
0.6300 mg | INHALATION_SOLUTION | RESPIRATORY_TRACT | Status: DC | PRN
  Filled 2013-08-18: qty 3

## 2013-08-18 MED ORDER — AMLODIPINE BESYLATE 5 MG PO TABS
5.0000 mg | ORAL_TABLET | Freq: Every day | ORAL | Status: DC
  Administered 2013-08-18 – 2013-08-19 (×2): 5 mg via ORAL
  Filled 2013-08-18 (×3): qty 1

## 2013-08-18 MED ORDER — FLUCONAZOLE 100 MG PO TABS
100.0000 mg | ORAL_TABLET | Freq: Every day | ORAL | Status: DC
  Administered 2013-08-19 – 2013-08-30 (×11): 100 mg via ORAL
  Filled 2013-08-18 (×12): qty 1

## 2013-08-18 NOTE — Clinical Social Work Placement (Signed)
Clinical Social Work Department CLINICAL SOCIAL WORK PLACEMENT NOTE 08/18/2013  Patient:  Mike Mack, Mike Mack  Account Number:  [id number] Admit date:  08/14/2013  Clinical Social Worker:  Adair Laundry  Date/time:  08/17/2013 03:30 PM  Clinical Social Work is seeking post-discharge placement for this patient at the following level of care:   SKILLED NURSING   (*CSW will update this form in Epic as items are completed)   08/17/2013  Patient/family provided with [place] Department of Clinical Social Work's list of facilities offering this level of care within the geographic area requested by the patient (or if unable, by the patient's family).  08/17/2013  Patient/family informed of their freedom to choose among providers that offer the needed level of care, that participate in Medicare, Medicaid or managed care program needed by the patient, have an available bed and are willing to accept the patient.  08/17/2013  Patient/family informed of [facility]' ownership interest in [facility], as well as of the fact that they are under no obligation to receive care at this facility.  PASARR submitted to EDS on  PASARR number received from [place] on   FL2 transmitted to all facilities in geographic area requested by pt/family on  08/17/2013 FL2 transmitted to all facilities within larger geographic area on   Patient informed that his/her managed care company has contracts with or will negotiate with  certain facilities, including the following:     Patient/family informed of bed offers received:  08/18/2013 Patient chooses bed at  Physician recommends and patient chooses bed at    Patient to be transferred to  on   Patient to be transferred to facility by   The following physician request were entered in Epic:   Additional Comments:    Liz Beach, Richrd Sox, [phone]

## 2013-08-18 NOTE — Progress Notes (Addendum)
[facility] for Infectious Disease  Date of Admission:  08/14/2013  Antibiotics: Vancomycin day 5 Zosyn day 5  Subjective: No complaints, is eating some  Objective: Temp:  [97.9 F (36.6 C)-98.6 F (37 C)] 98.6 F (37 C) (03/02 0800) Pulse Rate:  [80-95] 80 (03/02 0800) Resp:  [15-22] 15 (03/02 0800) BP: (132-145)/(69-73) 141/73 mmHg (03/02 0800) SpO2:  [94 %-98 %] 95 % (03/02 0800) Weight:  [140 lb 14 oz (63.9 kg)] 140 lb 14 oz (63.9 kg) (03/02 0500)  General: awake, alert Skin: no rashes Lungs: CTA B Cor: RRR without m Abdomen: Soft, NT, ND, covered incisions.  Ext: no edema  Lab Results Lab Results  Component Value Date   WBC 10.3 08/18/2013   HGB 7.8* 08/18/2013   HCT 23.6* 08/18/2013   MCV 79.7 08/18/2013   PLT 223 08/18/2013    Lab Results  Component Value Date   CREATININE 0.87 08/18/2013   BUN 16 08/18/2013   NA 138 08/18/2013   K 3.8 08/18/2013   CL 106 08/18/2013   CO2 23 08/18/2013    Lab Results  Component Value Date   ALT 43 08/15/2013   AST 57* 08/15/2013   ALKPHOS 246* 08/15/2013   BILITOT 0.4 08/15/2013      Microbiology: Recent Results (from the past 240 hour(s))  URINE CULTURE     Status: None   Collection Time    08/14/13 12:17 AM      Result Value Ref Range Status   Specimen Description URINE, CATHETERIZED   Final   Special Requests NONE   Final   Culture  Setup Time     Final   Value: 08/15/2013 03:29     Performed at [place]     Final   Value: NO GROWTH     Performed at [facility]   Culture     Final   Value: NO GROWTH     Performed at [facility]   Report Status 08/16/2013 FINAL   Final  CULTURE, BLOOD (ROUTINE X 2)     Status: None   Collection Time    08/14/13  2:38 PM      Result Value Ref Range Status   Specimen Description BLOOD ARM LEFT   Final   Special Requests BOTTLES DRAWN AEROBIC AND ANAEROBIC 5CC   Final   Culture  Setup Time     Final   Value: 08/14/2013 20:43     Performed  at [facility]   Culture     Final   Value: ENTEROCOCCUS SPECIES     Note: COMBINATION THERAPY OF HIGH DOSE AMPICILLIN OR VANCOMYCIN, PLUS AN AMINOGLYCOSIDE, IS USUALLY INDICATED FOR SERIOUS ENTEROCOCCAL INFECTIONS.     MORGANELLA MORGANII     Note: Gram Stain Report Called to,Read Back By and Verified With: MORGAN SCHMITZ 08/15/12 0815 BY SMITHERSJ CRITICAL RESULT CALLED TO, READ BACK BY AND VERIFIED WITH: ROSEMARY CLARK 2/29/15 @ 10:46AM BY RUSCOE A.     Performed at [facility]   Report Status 08/18/2013 FINAL   Final   Organism ID, Bacteria ENTEROCOCCUS SPECIES   Final   Organism ID, Bacteria MORGANELLA MORGANII   Final  CULTURE, BLOOD (ROUTINE X 2)     Status: None   Collection Time    08/14/13  2:52 PM      Result Value Ref Range Status   Specimen Description BLOOD RIGHT FOREARM   Final   Special  Requests BOTTLES DRAWN AEROBIC AND ANAEROBIC 5CC   Final   Culture  Setup Time     Final   Value: 08/14/2013 20:43     Performed at [facility]   Culture     Final   Value: ENTEROCOCCUS SPECIES     Note: SUSCEPTIBILITIES PERFORMED ON PREVIOUS CULTURE WITHIN THE LAST 5 DAYS.     Note: Gram Stain Report Called to,Read Back By and Verified With: Drema Dallas 08/15/13 0815 BY SMITHERSJ     Performed at [facility]   Report Status 08/17/2013 FINAL   Final  RAPID STREP SCREEN     Status: None   Collection Time    08/15/13 11:42 AM      Result Value Ref Range Status   Streptococcus, Group A Screen (Direct) NEGATIVE  NEGATIVE Final   Comment: (NOTE)     A Rapid Antigen test may result negative if the antigen level in the     sample is below the detection level of this test. The FDA has not     cleared this test as a stand-alone test therefore the rapid antigen     negative result has reflexed to a Group A Strep culture.  CULTURE, GROUP A STREP     Status: None   Collection Time    08/15/13 11:42 AM      Result Value Ref Range Status   Specimen  Description THROAT   Final   Special Requests NONE   Final   Culture     Final   Value: No Beta Hemolytic Streptococci Isolated     Performed at [facility]   Report Status 08/17/2013 FINAL   Final  CULTURE, ROUTINE-ABSCESS     Status: None   Collection Time    08/15/13  6:17 PM      Result Value Ref Range Status   Specimen Description ABSCESS GALL BLADDER   Final   Special Requests RUQ IN THE GB FOSSA,SPECIMEN NO 1   Final   Gram Stain     Final   Value: FEW WBC PRESENT,BOTH PMN AND MONONUCLEAR     NO SQUAMOUS EPITHELIAL CELLS SEEN     RARE GRAM POSITIVE COCCI IN PAIRS     RARE GRAM POSITIVE RODS     Performed at [facility]   Culture     Final   Value: Culture reincubated for better growth     Performed at [facility]   Report Status PENDING   Incomplete  CULTURE, ROUTINE-ABSCESS     Status: None   Collection Time    08/15/13  6:52 PM      Result Value Ref Range Status   Specimen Description ABSCESS   Final   Special Requests     Final   Value: MID EPIGASTRUM IN THE ANTERIOR PERI GASTRIC SAMPLE NO 2   Gram Stain     Final   Value: ABUNDANT WBC PRESENT, PREDOMINANTLY PMN     NO SQUAMOUS EPITHELIAL CELLS SEEN     ABUNDANT GRAM POSITIVE COCCI IN PAIRS     Performed at [facility]   Culture     Final   Value: ABUNDANT ENTEROCOCCUS SPECIES     Performed at [facility]   Report Status PENDING   Incomplete  CULTURE, BLOOD (ROUTINE X 2)     Status: None   Collection Time    08/16/13  1:50 PM      Result Value Ref  Range Status   Specimen Description BLOOD LEFT ARM   Final   Special Requests BOTTLES DRAWN AEROBIC AND ANAEROBIC 10CC   Final   Culture  Setup Time     Final   Value: 08/16/2013 23:31     Performed at [facility]   Culture     Final   Value:        BLOOD CULTURE RECEIVED NO GROWTH TO DATE CULTURE WILL BE HELD FOR 5 DAYS BEFORE ISSUING A FINAL NEGATIVE REPORT     Performed at [facility]   Report  Status PENDING   Incomplete  CULTURE, BLOOD (ROUTINE X 2)     Status: None   Collection Time    08/16/13  1:55 PM      Result Value Ref Range Status   Specimen Description BLOOD LEFT ARM   Final   Special Requests BOTTLES DRAWN AEROBIC AND ANAEROBIC 10CC   Final   Culture  Setup Time     Final   Value: 08/16/2013 23:31     Performed at [facility]   Culture     Final   Value:        BLOOD CULTURE RECEIVED NO GROWTH TO DATE CULTURE WILL BE HELD FOR 5 DAYS BEFORE ISSUING A FINAL NEGATIVE REPORT     Performed at [facility]   Report Status PENDING   Incomplete    Studies/Results: No results found.  Assessment/Plan: 1) abdominal abscess - Enterococcus in blood cutlures, abscess cultures, drained by IR 2/27, purulent.   Morganella also noted on one blood culture but gram stain noted Gram positive.  Enterococcus is ampicillin sensitive and will target the Enterococcus.  TTE noted and no vegetation.  Will need a prolonged course of IV ampicillin of 4-6 weeks and follow for resolution.  Repeat CT in 2-3 day per IR.    Scharlene Gloss, [facility] Infectious [facility] [place] [email or web address] [phone] pager   [phone] cell 08/18/2013, 10:42 AM

## 2013-08-18 NOTE — Progress Notes (Signed)
Progress Note [place] TEAM 1 - Stepdown/ICU TEAM   Mike Mack [id number] DOB: [birth year] DOA: 08/14/2013 PCP: [name], MD  Brief narrative: 78 y.o. male who presented on 08/14/2013 with a hx of HTN, HLD, prior stroke, recent laparoscopic cholecystectomy 07/25/2013 by [name] (discharged to [facility] 07/30/2013) with main concern of several days duration of persistent epigastric abdominal pain associated with fevers, chills, malaise.   In ED, CT abdomen notable for abscess in the gallbladder bed, as well as peri-hepatic abscesses. Surgery consulted and [facility] asked to admit for further evaluation. Pt started on Vancomycin and Zosyn in ED.  Subjective: Pt states he is feeling better today.  He denies cp, nv, or sob.  He reports only mild abdom discomfort.   Assessment/Plan:  Severe sepsis /  Enterococcus + ?Morganella bacteremia / subhepatic abscess /  epigastric abscess  - s/p lap cholecystectomy 07/25/2013 - now with 2 perc drains - bacteremia with enterococcus and ?Morganella - ID has decided to focus on the Enterococcus - IV ampicillin for 4-6 weeks and follow for resolution - repeat CT in 2-3 day per IR - pt is improving steadily on his current tx regimen   Diabetes mellitus with neuropathy - cont lantus and sliding scale - CBG reasonably controlled at present   Hypertension - BP creeping up - adjust tx today and follow   H/o Stroke w/ R sided weakness - med rec does not mention ASA or Statin, but old records reveal pt was on ASA 325 QD in Nov 2014 - reassess as able to advance diet  Anemia - follow - will need transfusion prn to keep Hgb 7.0 or >  Chronic steroid use - no need for stress dose steroids currently - unclear why pt is on chronic steroids (?arthritits) - suggest very slow wean to off as outpt if able   Thrush - cont diflucan - has sore throat as well which may be consistent with candida esophagitis  Dysphagia - likely due to above - follow with  Diflucan   BPH - Flomax  Elevated INR - due to poor nutrition?- given 1 U FFP prior to procedure and Vit K - f/u in AM  Code Status: FULL Family Communication: no family present at time of exam today  Disposition Plan: SDU  Consultants: Gen Surg  IR ID  Procedures: 2/27 - abdom abscess drain x2 in IR  2/28 - TTE - mild LVH - EF 55-60% - no WMA - grade 1 DD  Antibiotics: Ampicillin 3/02 >> Diflucan 3/01 >>  Zosyn 2/26 >> 3/02 Vanc 2/26 >> 3/01  DVT prophylaxis: Lovenox  Objective: Filed Weights   08/16/13 0400 08/17/13 0500 08/18/13 0500  Weight: 61.3 kg (135 lb 2.3 oz) 65.2 kg (143 lb 11.8 oz) 63.9 kg (140 lb 14 oz)   Blood pressure 142/64, pulse 81, temperature 98 F (36.7 C), temperature source Oral, resp. rate 16, height 5\' 5"  (1.651 m), weight 63.9 kg (140 lb 14 oz), SpO2 95.00%.  Intake/Output Summary (Last 24 hours) at 08/18/13 1350 Last data filed at 08/18/13 1127  Gross per 24 hour  Intake    960 ml  Output   1800 ml  Net   -840 ml   Exam: General: No acute respiratory distress - flat affect  Lungs: Clear to auscultation bilaterally without wheezes or crackles Cardiovascular: Regular rate and rhythm without murmur gallop or rub  Abdomen: RUQ drain with blood tinged fluid- other drain with pus, nondistended, soft, bowel sounds positive, no rebound,  no ascites, no appreciable mass Extremities: No significant cyanosis, clubbing, or edema bilateral lower extremities  Data Reviewed: Basic Metabolic Panel:  Recent Labs Lab 08/14/13 1345 08/14/13 2200 08/15/13 0233 08/15/13 1836 08/18/13 0308  NA 136*  --  140 144 138  K 5.2  --  4.8 4.2 3.8  CL 97  --  106 107 106  CO2 23  --  26 26 23   GLUCOSE 290*  --  238* 218* 126*  BUN 31*  --  29* 31* 16  CREATININE 1.06  --  1.04 1.18 0.87  CALCIUM 8.6  --  8.0* 8.1* 7.9*  MG  --  2.1  --   --   --   PHOS  --  3.1  --   --   --    Liver Function Tests:  Recent Labs Lab 08/14/13 1345 08/15/13 0233    AST 60* 57*  ALT 45 43  ALKPHOS 281* 246*  BILITOT 0.6 0.4  PROT 7.5 6.6  ALBUMIN 2.1* 1.8*   CBC:  Recent Labs Lab 08/14/13 1345 08/15/13 0233 08/16/13 0245 08/17/13 0905 08/18/13 0308  WBC 20.9* 19.0* 11.8* 11.9* 10.3  NEUTROABS 20.4*  --   --   --   --   HGB 9.8* 8.7* 7.3* 8.2* 7.8*  HCT 30.0* 26.0* 22.0* 25.1* 23.6*  MCV 81.7 81.0 80.6 80.7 79.7  PLT 311 254 180 223 223   Cardiac Enzymes:  Recent Labs Lab 08/14/13 1345  TROPONINI <0.30   CBG:  Recent Labs Lab 08/17/13 1140 08/17/13 1630 08/17/13 2138 08/18/13 0731 08/18/13 1128  GLUCAP 152* 149* 120* 116* 140*    Recent Results (from the past 240 hour(s))  URINE CULTURE     Status: None   Collection Time    08/14/13 12:17 AM      Result Value Ref Range Status   Specimen Description URINE, CATHETERIZED   Final   Special Requests NONE   Final   Culture  Setup Time     Final   Value: 08/15/2013 03:29     Performed at [facility]     Final   Value: NO GROWTH     Performed at [facility]   Culture     Final   Value: NO GROWTH     Performed at [facility]   Report Status 08/16/2013 FINAL   Final  CULTURE, BLOOD (ROUTINE X 2)     Status: None   Collection Time    08/14/13  2:38 PM      Result Value Ref Range Status   Specimen Description BLOOD ARM LEFT   Final   Special Requests BOTTLES DRAWN AEROBIC AND ANAEROBIC 5CC   Final   Culture  Setup Time     Final   Value: 08/14/2013 20:43     Performed at [facility]   Culture     Final   Value: ENTEROCOCCUS SPECIES     Note: COMBINATION THERAPY OF HIGH DOSE AMPICILLIN OR VANCOMYCIN, PLUS AN AMINOGLYCOSIDE, IS USUALLY INDICATED FOR SERIOUS ENTEROCOCCAL INFECTIONS.     MORGANELLA MORGANII     Note: Gram Stain Report Called to,Read Back By and Verified With: MORGAN SCHMITZ 08/15/12 0815 BY SMITHERSJ CRITICAL RESULT CALLED TO, READ BACK BY AND VERIFIED WITH: ROSEMARY CLARK 2/29/15 @ 10:46AM BY RUSCOE A.      Performed at [facility]   Report Status 08/18/2013 FINAL   Final   Organism ID,  Bacteria ENTEROCOCCUS SPECIES   Final   Organism ID, Bacteria MORGANELLA MORGANII   Final  CULTURE, BLOOD (ROUTINE X 2)     Status: None   Collection Time    08/14/13  2:52 PM      Result Value Ref Range Status   Specimen Description BLOOD RIGHT FOREARM   Final   Special Requests BOTTLES DRAWN AEROBIC AND ANAEROBIC 5CC   Final   Culture  Setup Time     Final   Value: 08/14/2013 20:43     Performed at [facility]   Culture     Final   Value: ENTEROCOCCUS SPECIES     Note: SUSCEPTIBILITIES PERFORMED ON PREVIOUS CULTURE WITHIN THE LAST 5 DAYS.     Note: Gram Stain Report Called to,Read Back By and Verified With: Drema Dallas 08/15/13 0815 BY SMITHERSJ     Performed at [facility]   Report Status 08/17/2013 FINAL   Final  RAPID STREP SCREEN     Status: None   Collection Time    08/15/13 11:42 AM      Result Value Ref Range Status   Streptococcus, Group A Screen (Direct) NEGATIVE  NEGATIVE Final   Comment: (NOTE)     A Rapid Antigen test may result negative if the antigen level in the     sample is below the detection level of this test. The FDA has not     cleared this test as a stand-alone test therefore the rapid antigen     negative result has reflexed to a Group A Strep culture.  CULTURE, GROUP A STREP     Status: None   Collection Time    08/15/13 11:42 AM      Result Value Ref Range Status   Specimen Description THROAT   Final   Special Requests NONE   Final   Culture     Final   Value: No Beta Hemolytic Streptococci Isolated     Performed at [facility]   Report Status 08/17/2013 FINAL   Final  CULTURE, ROUTINE-ABSCESS     Status: None   Collection Time    08/15/13  6:17 PM      Result Value Ref Range Status   Specimen Description ABSCESS GALL BLADDER   Final   Special Requests RUQ IN THE GB FOSSA,SPECIMEN NO 1   Final   Gram Stain     Final   Value:  FEW WBC PRESENT,BOTH PMN AND MONONUCLEAR     NO SQUAMOUS EPITHELIAL CELLS SEEN     RARE GRAM POSITIVE COCCI IN PAIRS     RARE GRAM POSITIVE RODS     Performed at [facility]   Culture     Final   Value: Culture reincubated for better growth     Performed at [facility]   Report Status PENDING   Incomplete  CULTURE, ROUTINE-ABSCESS     Status: None   Collection Time    08/15/13  6:52 PM      Result Value Ref Range Status   Specimen Description ABSCESS   Final   Special Requests     Final   Value: MID EPIGASTRUM IN THE ANTERIOR PERI GASTRIC SAMPLE NO 2   Gram Stain     Final   Value: ABUNDANT WBC PRESENT, PREDOMINANTLY PMN     NO SQUAMOUS EPITHELIAL CELLS SEEN     ABUNDANT GRAM POSITIVE COCCI IN PAIRS     Performed at [facility]  Culture     Final   Value: ABUNDANT ENTEROCOCCUS SPECIES     Performed at [facility]   Report Status PENDING   Incomplete  CULTURE, BLOOD (ROUTINE X 2)     Status: None   Collection Time    08/16/13  1:50 PM      Result Value Ref Range Status   Specimen Description BLOOD LEFT ARM   Final   Special Requests BOTTLES DRAWN AEROBIC AND ANAEROBIC 10CC   Final   Culture  Setup Time     Final   Value: 08/16/2013 23:31     Performed at [facility]   Culture     Final   Value:        BLOOD CULTURE RECEIVED NO GROWTH TO DATE CULTURE WILL BE HELD FOR 5 DAYS BEFORE ISSUING A FINAL NEGATIVE REPORT     Performed at [facility]   Report Status PENDING   Incomplete  CULTURE, BLOOD (ROUTINE X 2)     Status: None   Collection Time    08/16/13  1:55 PM      Result Value Ref Range Status   Specimen Description BLOOD LEFT ARM   Final   Special Requests BOTTLES DRAWN AEROBIC AND ANAEROBIC 10CC   Final   Culture  Setup Time     Final   Value: 08/16/2013 23:31     Performed at [facility]   Culture     Final   Value:        BLOOD CULTURE RECEIVED NO GROWTH TO DATE CULTURE WILL BE HELD FOR 5 DAYS BEFORE  ISSUING A FINAL NEGATIVE REPORT     Performed at [facility]   Report Status PENDING   Incomplete     Studies:  Recent x-ray studies have been reviewed in detail by the Attending Physician  Scheduled Meds:  Scheduled Meds: . ampicillin (OMNIPEN) IV  2 g Intravenous 4 times per day  . docusate sodium  100 mg Oral BID  . enoxaparin (LOVENOX) injection  40 mg Subcutaneous Q24H  . fluconazole (DIFLUCAN) IV  100 mg Intravenous Q24H  . insulin aspart  0-15 Units Subcutaneous TID WC  . insulin aspart  0-5 Units Subcutaneous QHS  . insulin glargine  5 Units Subcutaneous QHS  . irbesartan  300 mg Oral QHS  . latanoprost  1 drop Both Eyes QHS  . nystatin  5 mL Oral BID  . nystatin  5 mL Oral QID  . polyethylene glycol  17 g Oral TID  . predniSONE  5 mg Oral Q breakfast  . tamsulosin  0.4 mg Oral BID   Time spent on care of this patient: 35 min  Anahit Klumb T, MD  [facility] Office  [phone] Pager - Text Page per Shea Evans as per below:  On-Call/Text Page:      [email or web address]      password [email or web address]  If 7PM-7AM, please contact night-coverage [email or web address] Password [email or web address] 08/18/2013, 1:50 PM   LOS: 4 days

## 2013-08-18 NOTE — Progress Notes (Addendum)
Subjective: Events noted. Re-admitted for subhepatic and epigastric abscess seen on CT scan February 26. Drain placed in the right upper quadrant and suction drain placed in the epigastrium on February 27 Cultures growing gram-positive cocci in gram-positive rods. On Diflucan, Zosyn, . Remains on prednisone, 5 mg a day. Remains on Lantus insulin, 5 units at bedtime plus sliding-scale insulin.  Currently stable. Afebrile. Alert. Acute on chronic deconditioning.  Seems to be tolerating some full liquids.  Total drainage from both drains approximately 30 cc per 24 hours.  Objective: Vital signs in last 24 hours: Temp:  [97.9 F (36.6 C)-98.4 F (36.9 C)] 98.4 F (36.9 C) (03/02 0400) Pulse Rate:  [84-95] 87 (03/02 0400) Resp:  [16-22] 18 (03/02 0400) BP: (132-153)/(69-73) 145/72 mmHg (03/02 0400) SpO2:  [94 %-98 %] 97 % (03/02 0400) Weight:  [140 lb 14 oz (63.9 kg)] 140 lb 14 oz (63.9 kg) (03/02 0500) Last BM Date: 08/17/13  Intake/Output from previous day: 03/01 0701 - 03/02 0700 In: 1085 [P.O.:200; I.V.:865] Out: 2130 [Urine:2100; Drains:30] Intake/Output this shift: Total I/O In: -  Out: 850 [Urine:850]     EXAM: General appearance: overt. Cooperative. No acute distress. Significant deconditioning. GI: abdomen soft. Benign. Nontender. Drainage with a little bit of serosanguineous fluid. Not as turbid as before.  Lab Results:   Recent Labs  08/17/13 0905 08/18/13 0308  WBC 11.9* 10.3  HGB 8.2* 7.8*  HCT 25.1* 23.6*  PLT 223 223   BMET  Recent Labs  08/15/13 1836 08/18/13 0308  NA 144 138  K 4.2 3.8  CL 107 106  CO2 26 23  GLUCOSE 218* 126*  BUN 31* 16  CREATININE 1.18 0.87  CALCIUM 8.1* 7.9*   PT/INR  Recent Labs  08/16/13 1005 08/18/13 0308  LABPROT 18.7* 16.3*  INR 1.61* 1.34   ABG No results found for this basename: PHART, PCO2, PO2, HCO3,  in the last 72 hours  Studies/Results: No results  found.  Anti-infectives: Anti-infectives   Start     Dose/Rate Route Frequency Ordered Stop   08/17/13 1000  fluconazole (DIFLUCAN) IVPB 100 mg     100 mg 50 mL/hr over 60 Minutes Intravenous Every 24 hours 08/17/13 0936     08/16/13 1100  vancomycin (VANCOCIN) 500 mg in sodium chloride 0.9 % 100 mL IVPB     500 mg 100 mL/hr over 60 Minutes Intravenous Every 12 hours 08/16/13 1016     08/15/13 1200  vancomycin (VANCOCIN) 1,250 mg in sodium chloride 0.9 % 250 mL IVPB  Status:  Discontinued     1,250 mg 166.7 mL/hr over 90 Minutes Intravenous Every 24 hours 08/14/13 1911 08/16/13 1016   08/15/13 0000  piperacillin-tazobactam (ZOSYN) IVPB 3.375 g     3.375 g 12.5 mL/hr over 240 Minutes Intravenous Every 8 hours 08/14/13 1911     08/14/13 1530  vancomycin (VANCOCIN) IVPB 1000 mg/200 mL premix     1,000 mg 200 mL/hr over 60 Minutes Intravenous  Once 08/14/13 1526 08/14/13 1823   08/14/13 1530  piperacillin-tazobactam (ZOSYN) IVPB 3.375 g     3.375 g 100 mL/hr over 30 Minutes Intravenous  Once 08/14/13 1526 08/14/13 1603      Assessment/Plan:  Abdominal and hepatic abscess , status post laparoscopic lysis of adhesions, laparoscopic cholecystectomy and cholangiogram (07-25-2013) Clinically stable Continue drainage and antibiotics Reassess adequacy of drainage by CT when interventional radiology feels this is appropriate  History of cerebrovascular accident with right-sided weakness History of polio Insulin-dependent diabetes Hypertension  Arthritis on chronic prednisone Remote history of laparotomy for SB BPH Recent enterococcus UTI Chronic constipation and ileus   LOS: 4 days    Mike Mack M 08/18/2013

## 2013-08-18 NOTE — Progress Notes (Addendum)
eLink Physician-Brief Progress Note Patient Name: Mike Mack DOB: 06-14-1936 MRN: [id number]  Date of Service  08/18/2013   HPI/Events of Note   Reviewed micro mrganella and enteroccs noted res to amp for morg  eICU Interventions  Will call [facility] to discuss coverage for mrganella   Intervention Category Intermediate Interventions: Infection - evaluation and management  Raylene Miyamoto. 08/18/2013, 3:26 PM  Note to PCCM: Please see ID notes - ID is following patient and they/we are aware of Morganella.  Cherene Altes, MD [facility] For Consults/Admissions - Flow Manager - [phone] Office  [phone] Pager [phone]  On-Call/Text Page:      [email or web address]      password [facility]

## 2013-08-18 NOTE — Progress Notes (Signed)
Subjective: Pt feeling a little better each day. Trying some full liquids. No N/V   Objective: Physical Exam: BP 141/73  Pulse 80  Temp(Src) 98.6 F (37 C) (Oral)  Resp 15  Ht 5\' 5"  (1.651 m)  Wt 140 lb 14 oz (63.9 kg)  BMI 23.44 kg/m2  SpO2 95% RUQ drains intact, sites clean, NT #1 drain(RUQ) still with cloudy output, 17.51mL $2 drain (midline) with serous output 12.50mL   Labs: CBC  Recent Labs  08/17/13 0905 08/18/13 0308  WBC 11.9* 10.3  HGB 8.2* 7.8*  HCT 25.1* 23.6*  PLT 223 223   BMET  Recent Labs  08/15/13 1836 08/18/13 0308  NA 144 138  K 4.2 3.8  CL 107 106  CO2 26 23  GLUCOSE 218* 126*  BUN 31* 16  CREATININE 1.18 0.87  CALCIUM 8.1* 7.9*   LFT No results found for this basename: PROT, ALBUMIN, AST, ALT, ALKPHOS, BILITOT, BILIDIR, IBILI, LIPASE,  in the last 72 hours PT/INR  Recent Labs  08/16/13 1005 08/18/13 0308  LABPROT 18.7* 16.3*  INR 1.61* 1.34     Studies/Results: No results found.  Assessment/Plan: S/p RUQ abscess drains X 2 WBC normal Cx = Enterococcus, sensitivity pending Cont drain flushes Rec repeat CT in another 2-3 days to reassess.  LOS: 4 days    Ascencion Dike PA-C 08/18/2013 9:52 AM

## 2013-08-19 DIAGNOSIS — T8140XA Infection following a procedure, unspecified, initial encounter: Secondary | ICD-10-CM

## 2013-08-19 DIAGNOSIS — D649 Anemia, unspecified: Secondary | ICD-10-CM

## 2013-08-19 DIAGNOSIS — K810 Acute cholecystitis: Secondary | ICD-10-CM

## 2013-08-19 LAB — CULTURE, ROUTINE-ABSCESS

## 2013-08-19 LAB — GLUCOSE, CAPILLARY
GLUCOSE-CAPILLARY: 154 mg/dL — AB (ref 70–99)
GLUCOSE-CAPILLARY: 128 mg/dL — AB (ref 70–99)
Glucose-Capillary: 75 mg/dL (ref 70–99)
Glucose-Capillary: 115 mg/dL — ABNORMAL HIGH (ref 70–99)

## 2013-08-19 LAB — CBC
HCT: 22.8 % — ABNORMAL LOW (ref 39.0–52.0)
HEMOGLOBIN: 7.5 g/dL — AB (ref 13.0–17.0)
MCH: 26 pg (ref 26.0–34.0)
MCHC: 32.9 g/dL (ref 30.0–36.0)
MCV: 78.9 fL (ref 78.0–100.0)
PLATELETS: 249 10*3/uL (ref 150–400)
RBC: 2.89 MIL/uL — ABNORMAL LOW (ref 4.22–5.81)
RDW: 16.9 % — ABNORMAL HIGH (ref 11.5–15.5)
WBC: 8.9 10*3/uL (ref 4.0–10.5)

## 2013-08-19 MED ORDER — ENSURE COMPLETE PO LIQD
237.0000 mL | Freq: Two times a day (BID) | ORAL | Status: DC
  Administered 2013-08-19 – 2013-08-26 (×12): 237 mL via ORAL

## 2013-08-19 MED ORDER — ADULT MULTIVITAMIN W/MINERALS CH
1.0000 | ORAL_TABLET | Freq: Every day | ORAL | Status: DC
  Administered 2013-08-19 – 2013-09-06 (×19): 1 via ORAL
  Filled 2013-08-19 (×19): qty 1

## 2013-08-19 NOTE — Progress Notes (Signed)
  Subjective: Pt states he feels a little better; still appears weak; denies N/V; abd pain is mild  Objective: Vital signs in last 24 hours: Temp:  [97.6 F (36.4 C)-98.7 F (37.1 C)] 98.7 F (37.1 C) (03/03 0700) Pulse Rate:  [81-102] 83 (03/03 0700) Resp:  [16-24] 21 (03/03 0700) BP: (135-160)/(61-78) 135/61 mmHg (03/03 0700) SpO2:  [94 %-100 %] 94 % (03/03 0700) Weight:  [141 lb 8.6 oz (64.2 kg)] 141 lb 8.6 oz (64.2 kg) (03/03 0331) Last BM Date: 08/19/13  Intake/Output from previous day: 03/02 0701 - 03/03 0700 In: 2100 [P.O.:360; I.V.:1400; IV Piggyback:300] Out: 1907.5 [Urine:1850; Drains:57.5] Intake/Output this shift: Total I/O In: 475 [I.V.:50; Other:425] Out: 25 [Drains:25]  GB fossa drain (#1) output 20 cc's today; epigastric drain(# 2) output minimal; both drains intact, insertion sites ok, mildly tender; abd soft  Lab Results:   Recent Labs  08/18/13 0308 08/19/13 0237  WBC 10.3 8.9  HGB 7.8* 7.5*  HCT 23.6* 22.8*  PLT 223 249   BMET  Recent Labs  08/18/13 0308  NA 138  K 3.8  CL 106  CO2 23  GLUCOSE 126*  BUN 16  CREATININE 0.87  CALCIUM 7.9*   PT/INR  Recent Labs  08/16/13 1005 08/18/13 0308  LABPROT 18.7* 16.3*  INR 1.61* 1.34   ABG No results found for this basename: PHART, PCO2, PO2, HCO3,  in the last 72 hours  Studies/Results: No results found.  Anti-infectives: Anti-infectives   Start     Dose/Rate Route Frequency Ordered Stop   08/18/13 1645  fluconazole (DIFLUCAN) tablet 100 mg     100 mg Oral Daily 08/18/13 1641     08/18/13 1200  ampicillin (OMNIPEN) 2 g in sodium chloride 0.9 % 50 mL IVPB    Comments:  Pharmacy may adjust as needed   2 g 150 mL/hr over 20 Minutes Intravenous 4 times per day 08/18/13 1049     08/17/13 1000  fluconazole (DIFLUCAN) IVPB 100 mg  Status:  Discontinued     100 mg 50 mL/hr over 60 Minutes Intravenous Every 24 hours 08/17/13 0936 08/18/13 1641   08/16/13 1100  vancomycin (VANCOCIN) 500  mg in sodium chloride 0.9 % 100 mL IVPB  Status:  Discontinued     500 mg 100 mL/hr over 60 Minutes Intravenous Every 12 hours 08/16/13 1016 08/18/13 1049   08/15/13 1200  vancomycin (VANCOCIN) 1,250 mg in sodium chloride 0.9 % 250 mL IVPB  Status:  Discontinued     1,250 mg 166.7 mL/hr over 90 Minutes Intravenous Every 24 hours 08/14/13 1911 08/16/13 1016   08/15/13 0000  piperacillin-tazobactam (ZOSYN) IVPB 3.375 g  Status:  Discontinued     3.375 g 12.5 mL/hr over 240 Minutes Intravenous Every 8 hours 08/14/13 1911 08/18/13 1049   08/14/13 1530  vancomycin (VANCOCIN) IVPB 1000 mg/200 mL premix     1,000 mg 200 mL/hr over 60 Minutes Intravenous  Once 08/14/13 1526 08/14/13 1823   08/14/13 1530  piperacillin-tazobactam (ZOSYN) IVPB 3.375 g     3.375 g 100 mL/hr over 30 Minutes Intravenous  Once 08/14/13 1526 08/14/13 1603      Assessment/Plan: s/p GB fossa/epigastric abscess drainages 2/27; cont current tx/drain irrigations; monitor labs- Hgb 7.5 today -may benefit from transfusion; would check f/u CT 3/5 or 3/6 to assess adequacy of drainage  LOS: 5 days    Nakya Weyand,D [facility] 08/19/2013

## 2013-08-19 NOTE — Progress Notes (Signed)
Utilization review completed.  

## 2013-08-19 NOTE — Progress Notes (Signed)
[place] for Infectious Disease  Date of Admission:  08/14/2013  Antibiotics: Vancomycin 5 days Zosyn 5 days Ampicillin day 2  Subjective: No complaints  Objective: Temp:  [97.6 F (36.4 C)-98.7 F (37.1 C)] 98.5 F (36.9 C) (03/03 1200) Pulse Rate:  [83-102] 83 (03/03 0700) Resp:  [17-24] 21 (03/03 0700) BP: (135-160)/(61-78) 135/61 mmHg (03/03 0700) SpO2:  [94 %-100 %] 94 % (03/03 0700) Weight:  [141 lb 8.6 oz (64.2 kg)] 141 lb 8.6 oz (64.2 kg) (03/03 0331)  General: awake, alert Skin: no rashes Lungs: CTA B Cor: RRR without m Abdomen: Soft, NT, ND, covered incisions.  Ext: no edema  Lab Results Lab Results  Component Value Date   WBC 8.9 08/19/2013   HGB 7.5* 08/19/2013   HCT 22.8* 08/19/2013   MCV 78.9 08/19/2013   PLT 249 08/19/2013    Lab Results  Component Value Date   CREATININE 0.87 08/18/2013   BUN 16 08/18/2013   NA 138 08/18/2013   K 3.8 08/18/2013   CL 106 08/18/2013   CO2 23 08/18/2013    Lab Results  Component Value Date   ALT 43 08/15/2013   AST 57* 08/15/2013   ALKPHOS 246* 08/15/2013   BILITOT 0.4 08/15/2013      Microbiology: Recent Results (from the past 240 hour(s))  URINE CULTURE     Status: None   Collection Time    08/14/13 12:17 AM      Result Value Ref Range Status   Specimen Description URINE, CATHETERIZED   Final   Special Requests NONE   Final   Culture  Setup Time     Final   Value: 08/15/2013 03:29     Performed at [facility]     Final   Value: NO GROWTH     Performed at [facility]   Culture     Final   Value: NO GROWTH     Performed at [facility]   Report Status 08/16/2013 FINAL   Final  CULTURE, BLOOD (ROUTINE X 2)     Status: None   Collection Time    08/14/13  2:38 PM      Result Value Ref Range Status   Specimen Description BLOOD ARM LEFT   Final   Special Requests BOTTLES DRAWN AEROBIC AND ANAEROBIC 5CC   Final   Culture  Setup Time     Final   Value: 08/14/2013 20:43    Performed at [facility]   Culture     Final   Value: ENTEROCOCCUS SPECIES     Note: COMBINATION THERAPY OF HIGH DOSE AMPICILLIN OR VANCOMYCIN, PLUS AN AMINOGLYCOSIDE, IS USUALLY INDICATED FOR SERIOUS ENTEROCOCCAL INFECTIONS.     MORGANELLA MORGANII     Note: Gram Stain Report Called to,Read Back By and Verified With: MORGAN SCHMITZ 08/15/12 0815 BY SMITHERSJ CRITICAL RESULT CALLED TO, READ BACK BY AND VERIFIED WITH: ROSEMARY CLARK 2/29/15 @ 10:46AM BY RUSCOE A.     Performed at [facility]   Report Status 08/18/2013 FINAL   Final   Organism ID, Bacteria ENTEROCOCCUS SPECIES   Final   Organism ID, Bacteria MORGANELLA MORGANII   Final  CULTURE, BLOOD (ROUTINE X 2)     Status: None   Collection Time    08/14/13  2:52 PM      Result Value Ref Range Status   Specimen Description BLOOD RIGHT FOREARM   Final   Special Requests BOTTLES  DRAWN AEROBIC AND ANAEROBIC 5CC   Final   Culture  Setup Time     Final   Value: 08/14/2013 20:43     Performed at [facility]   Culture     Final   Value: ENTEROCOCCUS SPECIES     Note: SUSCEPTIBILITIES PERFORMED ON PREVIOUS CULTURE WITHIN THE LAST 5 DAYS.     Note: Gram Stain Report Called to,Read Back By and Verified With: Bernarda Caffey 08/15/13 0815 BY SMITHERSJ     Performed at [facility]   Report Status 08/17/2013 FINAL   Final  RAPID STREP SCREEN     Status: None   Collection Time    08/15/13 11:42 AM      Result Value Ref Range Status   Streptococcus, Group A Screen (Direct) NEGATIVE  NEGATIVE Final   Comment: (NOTE)     A Rapid Antigen test may result negative if the antigen level in the     sample is below the detection level of this test. The FDA has not     cleared this test as a stand-alone test therefore the rapid antigen     negative result has reflexed to a Group A Strep culture.  CULTURE, GROUP A STREP     Status: None   Collection Time    08/15/13 11:42 AM      Result Value Ref Range Status    Specimen Description THROAT   Final   Special Requests NONE   Final   Culture     Final   Value: No Beta Hemolytic Streptococci Isolated     Performed at [facility]   Report Status 08/17/2013 FINAL   Final  CULTURE, ROUTINE-ABSCESS     Status: None   Collection Time    08/15/13  6:17 PM      Result Value Ref Range Status   Specimen Description ABSCESS GALL BLADDER   Final   Special Requests RUQ IN THE GB FOSSA,SPECIMEN NO 1   Final   Gram Stain     Final   Value: FEW WBC PRESENT,BOTH PMN AND MONONUCLEAR     NO SQUAMOUS EPITHELIAL CELLS SEEN     RARE GRAM POSITIVE COCCI IN PAIRS     RARE GRAM POSITIVE RODS     Performed at [facility]   Culture     Final   Value: MULTIPLE ORGANISMS PRESENT, NONE PREDOMINANT     Note: NO STAPHYLOCOCCUS AUREUS ISOLATED NO GROUP A STREP (S.PYOGENES) ISOLATED     Performed at [facility]   Report Status 08/19/2013 FINAL   Final  CULTURE, ROUTINE-ABSCESS     Status: None   Collection Time    08/15/13  6:52 PM      Result Value Ref Range Status   Specimen Description ABSCESS   Final   Special Requests     Final   Value: MID EPIGASTRUM IN THE ANTERIOR PERI GASTRIC SAMPLE NO 2   Gram Stain     Final   Value: ABUNDANT WBC PRESENT, PREDOMINANTLY PMN     NO SQUAMOUS EPITHELIAL CELLS SEEN     ABUNDANT GRAM POSITIVE COCCI IN PAIRS     Performed at [facility]   Culture     Final   Value: ABUNDANT ENTEROCOCCUS SPECIES     Performed at [facility]   Report Status 08/19/2013 FINAL   Final   Organism ID, Bacteria ENTEROCOCCUS SPECIES   Final  CULTURE, BLOOD (ROUTINE X 2)  Status: None   Collection Time    08/16/13  1:50 PM      Result Value Ref Range Status   Specimen Description BLOOD LEFT ARM   Final   Special Requests BOTTLES DRAWN AEROBIC AND ANAEROBIC 10CC   Final   Culture  Setup Time     Final   Value: 08/16/2013 23:31     Performed at [facility]   Culture     Final   Value:         BLOOD CULTURE RECEIVED NO GROWTH TO DATE CULTURE WILL BE HELD FOR 5 DAYS BEFORE ISSUING A FINAL NEGATIVE REPORT     Performed at [facility]   Report Status PENDING   Incomplete  CULTURE, BLOOD (ROUTINE X 2)     Status: None   Collection Time    08/16/13  1:55 PM      Result Value Ref Range Status   Specimen Description BLOOD LEFT ARM   Final   Special Requests BOTTLES DRAWN AEROBIC AND ANAEROBIC 10CC   Final   Culture  Setup Time     Final   Value: 08/16/2013 23:31     Performed at [facility]   Culture     Final   Value:        BLOOD CULTURE RECEIVED NO GROWTH TO DATE CULTURE WILL BE HELD FOR 5 DAYS BEFORE ISSUING A FINAL NEGATIVE REPORT     Performed at [facility]   Report Status PENDING   Incomplete    Studies/Results: No results found.  Assessment/Plan: 1) abdominal abscess - Enterococcus in blood cutlures, abscess cultures, drained by IR 2/27, purulent.   Morganella also noted on one blood culture but gram stain noted Gram positive.  Enterococcus is ampicillin sensitive and will target the Enterococcus.  TTE noted and no vegetation.  Will need a prolonged course of IV ampicillin of 4-6 weeks and follow for resolution.  Repeat CT in a day or two per IR.    Scharlene Gloss, [place] for Infectious Disease [place] [email or web address] [phone] pager   [phone] cell 08/19/2013, 1:13 PM

## 2013-08-19 NOTE — Progress Notes (Signed)
NUTRITION FOLLOW-UP  DOCUMENTATION CODES Per approved criteria  -Non-severe (moderate) malnutrition in the context of chronic illness   Patient meets criteria for moderate malnutrition in the context of chronic illness as evidenced by mild-moderate muscle loss and 6% weight loss in < 1 month.  INTERVENTION: Add Ensure Complete po BID, each supplement provides 350 kcal and 13 grams of protein. Add MVI daily. RD to continue to follow nutrition care plan.  NUTRITION DIAGNOSIS: Increased nutrient needs related to wound healing as evidenced by estimated nutrition needs. Ongoing  Goal: Pt to meet >/= 90% of their estimated nutrition needs   Monitor:  PO intake and supplement tolerance, weight, labs, I/O's  ASSESSMENT: 78 yo male with history of diabetes, HTN, HLD, prior stroke, recent laparoscopic cholecystectomy 07/25/2013 by [name], and discharged to [facility] 07/30/2013, now presented to [facility] ED with main concern of several days duration of persistent epigastric abdominal pain, throbbing and 5/10 in severity, non radiating, associated with fevers, chills, malaise, poor oral intake, no specific aggravating or alleviating factors.   Pt with severe sepsis s/p cholecystectomy; now s/p 2 perc drains.  Advanced to Dysphagia 1 diet (purees) today by MD. Pt has yet to receive tray. Feels weak, worried about losing more weight. Agreeable to Ensure.   Height: Ht Readings from Last 1 Encounters:  08/16/13 5\' 5"  (1.651 m)    Weight: Wt Readings from Last 1 Encounters:  08/19/13 141 lb 8.6 oz (64.2 kg)  Admit wt 131 lb  BMI:  Body mass index is 23.55 kg/(m^2). WNL  Estimated Nutritional Needs: Kcal: 1650-1850 Protein: 75-85 gm Fluid: 1.6-1.8 L  Skin: deep tissue injury to R heel  Diet Order: Dysphagia 1  EDUCATION NEEDS: -No education needs identified at this time   Intake/Output Summary (Last 24 hours) at 08/19/13 1101 Last data filed at 08/19/13 0800  Gross per 24 hour  Intake    2045 ml  Output   1660 ml  Net    385 ml    Last BM: 3/3  Labs:   Recent Labs Lab 08/14/13 1345 08/14/13 2200 08/15/13 0233 08/15/13 1836 08/18/13 0308  NA 136*  --  140 144 138  K 5.2  --  4.8 4.2 3.8  CL 97  --  106 107 106  CO2 23  --  26 26 23   BUN 31*  --  29* 31* 16  CREATININE 1.06  --  1.04 1.18 0.87  CALCIUM 8.6  --  8.0* 8.1* 7.9*  MG  --  2.1  --   --   --   PHOS  --  3.1  --   --   --   GLUCOSE 290*  --  238* 218* 126*    CBG (last 3)   Recent Labs  08/18/13 1616 08/18/13 2141 08/19/13 0819  GLUCAP 113* 115* 75    Scheduled Meds: . amLODipine  5 mg Oral Daily  . ampicillin (OMNIPEN) IV  2 g Intravenous 4 times per day  . docusate sodium  100 mg Oral BID  . enoxaparin (LOVENOX) injection  40 mg Subcutaneous Q24H  . fluconazole  100 mg Oral Daily  . insulin aspart  0-15 Units Subcutaneous TID WC  . insulin aspart  0-5 Units Subcutaneous QHS  . insulin glargine  5 Units Subcutaneous QHS  . irbesartan  300 mg Oral QHS  . latanoprost  1 drop Both Eyes QHS  . nystatin  5 mL Oral QID  . polyethylene glycol  17 g  Oral TID  . predniSONE  5 mg Oral Q breakfast  . tamsulosin  0.4 mg Oral BID    Continuous Infusions: . sodium chloride 50 mL/hr at 08/18/13 2352     Inda Coke MS, RD, LDN Inpatient Registered Dietitian Pager: [phone] After-hours pager: [phone]

## 2013-08-19 NOTE — Progress Notes (Signed)
Subjective: Remains afebrile, stable, deconditioned and febrile. States that his tongue is sore and he has some pain when he swallows. Being treated for thrush. Tolerating full liquid diet.    Had 1 loose bowel movement.    Denies abdominal pain.  Both drains seemed to be functioning. Total drainage 57.5 cc per 24 hours which is somewhat of an increase.  WBC 8900. Hemoglobin 7.5.  Objective: Vital signs in last 24 hours: Temp:  [97.6 F (36.4 C)-98.6 F (37 C)] 98.1 F (36.7 C) (03/03 0331) Pulse Rate:  [80-102] 97 (03/03 0331) Resp:  [15-24] 19 (03/03 0331) BP: (141-160)/(64-78) 142/76 mmHg (03/03 0331) SpO2:  [94 %-100 %] 94 % (03/03 0331) Weight:  [141 lb 8.6 oz (64.2 kg)] 141 lb 8.6 oz (64.2 kg) (03/03 0331) Last BM Date: 08/17/13  Intake/Output from previous day: 03/02 0701 - 03/03 0700 In: 1600 [P.O.:360; I.V.:1000; IV Piggyback:200] Out: 1907.5 [Urine:1850; Drains:57.5] Intake/Output this shift: Total I/O In: 270 [I.V.:250; Other:20] Out: 800 [Urine:800]  General appearance: alert. Cooperative. Significantly deconditioned and feeble. In no acute physical distress. GI: abdomen soft. Nontender. Benign. Wounds clean. Drainage now more serosanguineous, less purulent.  Lab Results:   Recent Labs  08/18/13 0308 08/19/13 0237  WBC 10.3 8.9  HGB 7.8* 7.5*  HCT 23.6* 22.8*  PLT 223 249   BMET  Recent Labs  08/18/13 0308  NA 138  K 3.8  CL 106  CO2 23  GLUCOSE 126*  BUN 16  CREATININE 0.87  CALCIUM 7.9*   PT/INR  Recent Labs  08/16/13 1005 08/18/13 0308  LABPROT 18.7* 16.3*  INR 1.61* 1.34   ABG No results found for this basename: PHART, PCO2, PO2, HCO3,  in the last 72 hours  Studies/Results: No results found.  Anti-infectives: Anti-infectives   Start     Dose/Rate Route Frequency Ordered Stop   08/18/13 1645  fluconazole (DIFLUCAN) tablet 100 mg     100 mg Oral Daily 08/18/13 1641     08/18/13 1200  ampicillin (OMNIPEN) 2 g in sodium  chloride 0.9 % 50 mL IVPB    Comments:  Pharmacy may adjust as needed   2 g 150 mL/hr over 20 Minutes Intravenous 4 times per day 08/18/13 1049     08/17/13 1000  fluconazole (DIFLUCAN) IVPB 100 mg  Status:  Discontinued     100 mg 50 mL/hr over 60 Minutes Intravenous Every 24 hours 08/17/13 0936 08/18/13 1641   08/16/13 1100  vancomycin (VANCOCIN) 500 mg in sodium chloride 0.9 % 100 mL IVPB  Status:  Discontinued     500 mg 100 mL/hr over 60 Minutes Intravenous Every 12 hours 08/16/13 1016 08/18/13 1049   08/15/13 1200  vancomycin (VANCOCIN) 1,250 mg in sodium chloride 0.9 % 250 mL IVPB  Status:  Discontinued     1,250 mg 166.7 mL/hr over 90 Minutes Intravenous Every 24 hours 08/14/13 1911 08/16/13 1016   08/15/13 0000  piperacillin-tazobactam (ZOSYN) IVPB 3.375 g  Status:  Discontinued     3.375 g 12.5 mL/hr over 240 Minutes Intravenous Every 8 hours 08/14/13 1911 08/18/13 1049   08/14/13 1530  vancomycin (VANCOCIN) IVPB 1000 mg/200 mL premix     1,000 mg 200 mL/hr over 60 Minutes Intravenous  Once 08/14/13 1526 08/14/13 1823   08/14/13 1530  piperacillin-tazobactam (ZOSYN) IVPB 3.375 g     3.375 g 100 mL/hr over 30 Minutes Intravenous  Once 08/14/13 1526 08/14/13 1603      Assessment/Plan:  Abdominal and hepatic abscess ,  Percutaneous drainage performed 08/15/2013. Status post laparoscopic lysis of adhesions, laparoscopic cholecystectomy and cholangiogram (07-25-2013)  Clinically stable  Continue drainage and antibiotics  Reassess adequacy of drainage by CT when interventional radiology feels this is appropriate   History of cerebrovascular accident with right-sided weakness  History of polio  Insulin-dependent diabetes  Hypertension  Arthritis on chronic prednisone  Remote history of laparotomy for SBO BPH  Recent enterococcus UTI  Chronic constipation and ileus Significant acute on chronic deconditioning. Eventual return to SNF.([facility])     LOS: 5 days     Mike Mack M 08/19/2013

## 2013-08-19 NOTE — Progress Notes (Signed)
CSW left bed offers again for patient and left voicemail for wife.  Tilden Fossa, MSW, [place] Social Worker [phone]

## 2013-08-19 NOTE — Progress Notes (Addendum)
Progress Note [place] TEAM 1 - Stepdown/ICU TEAM   Mike Mack WNU:[id number] DOB: 1936/01/01 DOA: 08/14/2013 PCP: Mike Bolt, MD  Brief narrative: 78 y.o. male who presented on 08/14/2013 with a hx of HTN, HLD, prior stroke, recent laparoscopic cholecystectomy 07/25/2013 by [name] (discharged to [facility] 07/30/2013) with main concern of several days duration of persistent epigastric abdominal pain associated with fevers, chills, malaise.   In ED, CT abdomen notable for abscess in the gallbladder bed, as well as peri-hepatic abscesses. Surgery consulted and [facility] asked to admit for further evaluation. Pt started on Vancomycin and Zosyn in ED.  Subjective: Pt has no complaint of pain today. Explained that he will need long term antibiotics.   Assessment/Plan:  Severe sepsis /  Enterococcus + ?Morganella bacteremia / subhepatic abscess /  epigastric abscess  - s/p lap cholecystectomy 07/25/2013 - now with 2 perc drains - bacteremia with enterococcus and ?Morganella - ID has decided to focus on the Enterococcus - IV ampicillin for 4-6 weeks and follow for resolution - repeat CT in 2-3 day per IR - pt is improving steadily on his current tx regimen  - repeat cultures negative  Diabetes mellitus with neuropathy - cont lantus and sliding scale - CBG reasonably controlled at present   Hypertension - BP creeping up - adjust tx today and follow   H/o Stroke w/ R sided weakness - med rec does not mention ASA or Statin, but old records reveal pt was on ASA 325 QD in Nov 2014 - reassess as able to advance diet  Anemia - follow - will need transfusion prn to keep Hgb 7.0 or > - anemia panel consistent with AOCD  Chronic steroid use - no need for stress dose steroids currently - unclear why pt is on chronic steroids (?arthritits) - suggest very slow wean to off as outpt if able   Thrush - cont diflucan - has sore throat as well which may be consistent with candida  esophagitis  Dysphagia - likely due to above - follow with Diflucan   BPH - Flomax  Elevated INR - due to poor nutrition?- given 1 U FFP prior to procedure and Vit K - f/u in AM  Code Status: FULL Family Communication: no family present at time of exam today - have been discussing with wife Disposition Plan: tx to med/surg- PT eval  Consultants: Gen Surg  IR ID  Procedures: 2/27 - abdom abscess drain x2 in IR  2/28 - TTE - mild LVH - EF 55-60% - no WMA - grade 1 DD  Antibiotics: Ampicillin 3/02 >> Diflucan 3/01 >>  Zosyn 2/26 >> 3/02 Vanc 2/26 >> 3/01  DVT prophylaxis: Lovenox  Objective: Filed Weights   08/17/13 0500 08/18/13 0500 08/19/13 0331  Weight: 65.2 kg (143 lb 11.8 oz) 63.9 kg (140 lb 14 oz) 64.2 kg (141 lb 8.6 oz)   Blood pressure 135/61, pulse 83, temperature 98.5 F (36.9 C), temperature source Oral, resp. rate 21, height 5\' 5"  (1.651 m), weight 64.2 kg (141 lb 8.6 oz), SpO2 94.00%.  Intake/Output Summary (Last 24 hours) at 08/19/13 1311 Last data filed at 08/19/13 1200  Gross per 24 hour  Intake   1845 ml  Output   1375 ml  Net    470 ml   Exam: General: No acute respiratory distress - flat affect  Lungs: Clear to auscultation bilaterally without wheezes or crackles Cardiovascular: Regular rate and rhythm without murmur gallop or rub  Abdomen: RUQ drain with blood  tinged fluid- other drain with pus, nondistended, soft, bowel sounds positive, no rebound, no ascites, no appreciable mass Extremities: No significant cyanosis, clubbing, or edema bilateral lower extremities  Data Reviewed: Basic Metabolic Panel:  Recent Labs Lab 08/14/13 1345 08/14/13 2200 08/15/13 0233 08/15/13 1836 08/18/13 0308  NA 136*  --  140 144 138  K 5.2  --  4.8 4.2 3.8  CL 97  --  106 107 106  CO2 23  --  26 26 23   GLUCOSE 290*  --  238* 218* 126*  BUN 31*  --  29* 31* 16  CREATININE 1.06  --  1.04 1.18 0.87  CALCIUM 8.6  --  8.0* 8.1* 7.9*  MG  --  2.1  --    --   --   PHOS  --  3.1  --   --   --    Liver Function Tests:  Recent Labs Lab 08/14/13 1345 08/15/13 0233  AST 60* 57*  ALT 45 43  ALKPHOS 281* 246*  BILITOT 0.6 0.4  PROT 7.5 6.6  ALBUMIN 2.1* 1.8*   CBC:  Recent Labs Lab 08/14/13 1345 08/15/13 0233 08/16/13 0245 08/17/13 0905 08/18/13 0308 08/19/13 0237  WBC 20.9* 19.0* 11.8* 11.9* 10.3 8.9  NEUTROABS 20.4*  --   --   --   --   --   HGB 9.8* 8.7* 7.3* 8.2* 7.8* 7.5*  HCT 30.0* 26.0* 22.0* 25.1* 23.6* 22.8*  MCV 81.7 81.0 80.6 80.7 79.7 78.9  PLT 311 254 180 223 223 249   Cardiac Enzymes:  Recent Labs Lab 08/14/13 1345  TROPONINI <0.30   CBG:  Recent Labs Lab 08/18/13 1128 08/18/13 1616 08/18/13 2141 08/19/13 0819 08/19/13 1133  GLUCAP 140* 113* 115* 75 115*    Recent Results (from the past 240 hour(s))  URINE CULTURE     Status: None   Collection Time    08/14/13 12:17 AM      Result Value Ref Range Status   Specimen Description URINE, CATHETERIZED   Final   Special Requests NONE   Final   Culture  Setup Time     Final   Value: 08/15/2013 03:29     Performed at [place]     Final   Value: NO GROWTH     Performed at [facility]   Culture     Final   Value: NO GROWTH     Performed at [facility]   Report Status 08/16/2013 FINAL   Final  CULTURE, BLOOD (ROUTINE X 2)     Status: None   Collection Time    08/14/13  2:38 PM      Result Value Ref Range Status   Specimen Description BLOOD ARM LEFT   Final   Special Requests BOTTLES DRAWN AEROBIC AND ANAEROBIC 5CC   Final   Culture  Setup Time     Final   Value: 08/14/2013 20:43     Performed at [facility]   Culture     Final   Value: ENTEROCOCCUS SPECIES     Note: COMBINATION THERAPY OF HIGH DOSE AMPICILLIN OR VANCOMYCIN, PLUS AN AMINOGLYCOSIDE, IS USUALLY INDICATED FOR SERIOUS ENTEROCOCCAL INFECTIONS.     MORGANELLA MORGANII     Note: Gram Stain Report Called to,Read Back By and  Verified With: MORGAN SCHMITZ 08/15/12 0815 BY SMITHERSJ CRITICAL RESULT CALLED TO, READ BACK BY AND VERIFIED WITH: ROSEMARY CLARK 2/29/15 @ 10:46AM BY RUSCOE A.  Performed at [facility]   Report Status 08/18/2013 FINAL   Final   Organism ID, Bacteria ENTEROCOCCUS SPECIES   Final   Organism ID, Bacteria MORGANELLA MORGANII   Final  CULTURE, BLOOD (ROUTINE X 2)     Status: None   Collection Time    08/14/13  2:52 PM      Result Value Ref Range Status   Specimen Description BLOOD RIGHT FOREARM   Final   Special Requests BOTTLES DRAWN AEROBIC AND ANAEROBIC 5CC   Final   Culture  Setup Time     Final   Value: 08/14/2013 20:43     Performed at [facility]   Culture     Final   Value: ENTEROCOCCUS SPECIES     Note: SUSCEPTIBILITIES PERFORMED ON PREVIOUS CULTURE WITHIN THE LAST 5 DAYS.     Note: Gram Stain Report Called to,Read Back By and Verified With: Drema Dallas 08/15/13 0815 BY SMITHERSJ     Performed at [facility]   Report Status 08/17/2013 FINAL   Final  RAPID STREP SCREEN     Status: None   Collection Time    08/15/13 11:42 AM      Result Value Ref Range Status   Streptococcus, Group A Screen (Direct) NEGATIVE  NEGATIVE Final   Comment: (NOTE)     A Rapid Antigen test may result negative if the antigen level in the     sample is below the detection level of this test. The FDA has not     cleared this test as a stand-alone test therefore the rapid antigen     negative result has reflexed to a Group A Strep culture.  CULTURE, GROUP A STREP     Status: None   Collection Time    08/15/13 11:42 AM      Result Value Ref Range Status   Specimen Description THROAT   Final   Special Requests NONE   Final   Culture     Final   Value: No Beta Hemolytic Streptococci Isolated     Performed at [facility]   Report Status 08/17/2013 FINAL   Final  CULTURE, ROUTINE-ABSCESS     Status: None   Collection Time    08/15/13  6:17 PM      Result Value  Ref Range Status   Specimen Description ABSCESS GALL BLADDER   Final   Special Requests RUQ IN THE GB FOSSA,SPECIMEN NO 1   Final   Gram Stain     Final   Value: FEW WBC PRESENT,BOTH PMN AND MONONUCLEAR     NO SQUAMOUS EPITHELIAL CELLS SEEN     RARE GRAM POSITIVE COCCI IN PAIRS     RARE GRAM POSITIVE RODS     Performed at [facility]   Culture     Final   Value: MULTIPLE ORGANISMS PRESENT, NONE PREDOMINANT     Note: NO STAPHYLOCOCCUS AUREUS ISOLATED NO GROUP A STREP (S.PYOGENES) ISOLATED     Performed at [facility]   Report Status 08/19/2013 FINAL   Final  CULTURE, ROUTINE-ABSCESS     Status: None   Collection Time    08/15/13  6:52 PM      Result Value Ref Range Status   Specimen Description ABSCESS   Final   Special Requests     Final   Value: MID EPIGASTRUM IN THE ANTERIOR PERI GASTRIC SAMPLE NO 2   Gram Stain     Final  Value: ABUNDANT WBC PRESENT, PREDOMINANTLY PMN     NO SQUAMOUS EPITHELIAL CELLS SEEN     ABUNDANT GRAM POSITIVE COCCI IN PAIRS     Performed at [facility]   Culture     Final   Value: ABUNDANT ENTEROCOCCUS SPECIES     Performed at [facility]   Report Status 08/19/2013 FINAL   Final   Organism ID, Bacteria ENTEROCOCCUS SPECIES   Final  CULTURE, BLOOD (ROUTINE X 2)     Status: None   Collection Time    08/16/13  1:50 PM      Result Value Ref Range Status   Specimen Description BLOOD LEFT ARM   Final   Special Requests BOTTLES DRAWN AEROBIC AND ANAEROBIC 10CC   Final   Culture  Setup Time     Final   Value: 08/16/2013 23:31     Performed at [facility]   Culture     Final   Value:        BLOOD CULTURE RECEIVED NO GROWTH TO DATE CULTURE WILL BE HELD FOR 5 DAYS BEFORE ISSUING A FINAL NEGATIVE REPORT     Performed at [facility]   Report Status PENDING   Incomplete  CULTURE, BLOOD (ROUTINE X 2)     Status: None   Collection Time    08/16/13  1:55 PM      Result Value Ref Range Status   Specimen  Description BLOOD LEFT ARM   Final   Special Requests BOTTLES DRAWN AEROBIC AND ANAEROBIC 10CC   Final   Culture  Setup Time     Final   Value: 08/16/2013 23:31     Performed at [facility]   Culture     Final   Value:        BLOOD CULTURE RECEIVED NO GROWTH TO DATE CULTURE WILL BE HELD FOR 5 DAYS BEFORE ISSUING A FINAL NEGATIVE REPORT     Performed at [facility]   Report Status PENDING   Incomplete     Studies:  Recent x-ray studies have been reviewed in detail by the Attending Physician  Scheduled Meds:  Scheduled Meds: . amLODipine  5 mg Oral Daily  . ampicillin (OMNIPEN) IV  2 g Intravenous 4 times per day  . docusate sodium  100 mg Oral BID  . enoxaparin (LOVENOX) injection  40 mg Subcutaneous Q24H  . feeding supplement (ENSURE COMPLETE)  237 mL Oral BID BM  . fluconazole  100 mg Oral Daily  . insulin aspart  0-15 Units Subcutaneous TID WC  . insulin aspart  0-5 Units Subcutaneous QHS  . insulin glargine  5 Units Subcutaneous QHS  . irbesartan  300 mg Oral QHS  . latanoprost  1 drop Both Eyes QHS  . multivitamin with minerals  1 tablet Oral Daily  . nystatin  5 mL Oral QID  . polyethylene glycol  17 g Oral TID  . predniSONE  5 mg Oral Q breakfast  . tamsulosin  0.4 mg Oral BID   Time spent on care of this patient: 49 min  Debbe Odea, MD  [facility] Office  [phone] Pager - Text Page per Shea Evans as per below:  On-Call/Text Page:      [email or web address]      password [email or web address]  If 7PM-7AM, please contact night-coverage [email or web address] Password [email or web address] 08/19/2013, 1:11 PM   LOS: 5 days

## 2013-08-20 DIAGNOSIS — R29898 Other symptoms and signs involving the musculoskeletal system: Secondary | ICD-10-CM | POA: Insufficient documentation

## 2013-08-20 DIAGNOSIS — G8310 Monoplegia of lower limb affecting unspecified side: Secondary | ICD-10-CM | POA: Insufficient documentation

## 2013-08-20 DIAGNOSIS — R5381 Other malaise: Secondary | ICD-10-CM

## 2013-08-20 DIAGNOSIS — M6281 Muscle weakness (generalized): Secondary | ICD-10-CM | POA: Insufficient documentation

## 2013-08-20 LAB — GLUCOSE, CAPILLARY
GLUCOSE-CAPILLARY: 165 mg/dL — AB (ref 70–99)
Glucose-Capillary: 119 mg/dL — ABNORMAL HIGH (ref 70–99)
Glucose-Capillary: 146 mg/dL — ABNORMAL HIGH (ref 70–99)
Glucose-Capillary: 113 mg/dL — ABNORMAL HIGH (ref 70–99)

## 2013-08-20 MED ORDER — AMLODIPINE BESYLATE 10 MG PO TABS
10.0000 mg | ORAL_TABLET | Freq: Every day | ORAL | Status: DC
  Administered 2013-08-21 – 2013-08-28 (×8): 10 mg via ORAL
  Filled 2013-08-20 (×9): qty 1

## 2013-08-20 MED ORDER — ASPIRIN EC 81 MG PO TBEC
81.0000 mg | DELAYED_RELEASE_TABLET | Freq: Every day | ORAL | Status: DC
  Administered 2013-08-20 – 2013-08-28 (×9): 81 mg via ORAL
  Filled 2013-08-20 (×9): qty 1

## 2013-08-20 NOTE — Progress Notes (Signed)
Progress Note [place] TEAM 1 - Stepdown/ICU TEAM   Mike Mack [id number] DOB: 02-03-1936 DOA: 08/14/2013 PCP: [name], MD  Brief narrative: 78 y.o. male who presented on 08/14/2013 with a hx of HTN, HLD, prior stroke, recent laparoscopic cholecystectomy 07/25/2013 by [name] (discharged to [facility] 07/30/2013) with several days duration of persistent epigastric abdominal pain associated with fevers, chills, and malaise.   In the ED CT abdomen was notable for abscess in the gallbladder bed, as well as peri-hepatic abscesses. Surgery was consulted and [facility] was asked to admit for further evaluation. Pt started on Vancomycin and Zosyn in ED.  Subjective: Pt is up in chair.  He denies signif abdom pain, but does admit to poor appetite and low grade nausea.  Has tolerated his full liquid diet w/o difficulty thus far.  No cp, vomiting, or sob.    Assessment/Plan:  Severe sepsis /  Enterococcus + ?Morganella bacteremia / subhepatic abscess /  epigastric abscess  - s/p lap cholecystectomy 07/25/2013 - now with 2 perc drains being managed by IR - bacteremia with enterococcus and ?morganella - ID has decided to focus on the Enterococcus (gram stain results not suggestive of active Morganella infection) - IV ampicillin for 4-6 weeks and follow for resolution - repeat CT abdom Thurs or Fri per IR - pt is improving steadily on his current tx regimen - repeat cultures negative - awaiting PICC line placement (explained need to pt)  Diabetes mellitus with neuropathy - CBG reasonably controlled at present   Hypertension - BP not ideal - follow as further adjustement of meds may soon be required   H/o Stroke w/ R sided weakness - med rec does not mention ASA or Statin, but old records reveal pt was on ASA 325 QD in Nov 2014 - add low dose ASA today - hold statin until tolerating full regular diet and lipids rechecked as outpt  Anemia - follow - will need transfusion prn to keep Hgb 7.0 or  > - anemia panel consistent with AOCD, 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 - given 1 U FFP prior to procedure - f/u normal - recheck on Friday   Code Status: FULL Family Communication: no family present at time of exam today  Disposition Plan: tx to med/surg - for eventual d/c back to SNF (?Friday) - needs f/u CT/drain checks as well as PICC for long term abx dosing   Consultants: Gen Surg  IR ID  Procedures: 2/27 - abdom abscess drain x2 in IR  2/28 - TTE - mild LVH - EF 55-60% - no WMA - grade 1 DD  Antibiotics: Ampicillin 3/02 >> Diflucan 3/01 >>  Zosyn 2/26 >> 3/02 Vanc 2/26 >> 3/01  DVT prophylaxis: Lovenox  Objective: Filed Weights   08/18/13 0500 08/19/13 0331 08/20/13 0524  Weight: 63.9 kg (140 lb 14 oz) 64.2 kg (141 lb 8.6 oz) 62 kg (136 lb 11 oz)   Blood pressure 144/67, pulse 84, temperature 98.6  F (37 C), temperature source Oral, resp. rate 14, height 5\' 5"  (1.651 m), weight 62 kg (136 lb 11 oz), SpO2 97.00%.  Intake/Output Summary (Last 24 hours) at 08/20/13 1025 Last data filed at 08/20/13 0900  Gross per 24 hour  Intake   1260 ml  Output   1875 ml  Net   -615 ml   Exam: General: No acute respiratory distress - more alert and engaging today  Lungs: Clear to auscultation bilaterally without wheezes or crackles Cardiovascular: Regular rate and rhythm without murmur gallop or rub  Abdomen: 2 drains intact w/ no d/c or erythema at insertion sites, nondistended, soft, bowel sounds positive, no  rebound, no ascites, no appreciable mass Extremities: No significant cyanosis, clubbing, or edema bilateral lower extremities  Data Reviewed: Basic Metabolic Panel:  Recent Labs Lab 08/14/13 1345 08/14/13 2200 08/15/13 0233 08/15/13 1836 08/18/13 0308  NA 136*  --  140 144 138  K 5.2  --  4.8 4.2 3.8  CL 97  --  106 107 106  CO2 23  --  26 26 23   GLUCOSE 290*  --  238* 218* 126*  BUN 31*  --  29* 31* 16  CREATININE 1.06  --  1.04 1.18 0.87  CALCIUM 8.6  --  8.0* 8.1* 7.9*  MG  --  2.1  --   --   --   PHOS  --  3.1  --   --   --    Liver Function Tests:  Recent Labs Lab 08/14/13 1345 08/15/13 0233  AST 60* 57*  ALT 45 43  ALKPHOS 281* 246*  BILITOT 0.6 0.4  PROT 7.5 6.6  ALBUMIN 2.1* 1.8*   CBC:  Recent Labs Lab 08/14/13 1345 08/15/13 0233 08/16/13 0245 08/17/13 0905 08/18/13 0308 08/19/13 0237  WBC 20.9* 19.0* 11.8* 11.9* 10.3 8.9  NEUTROABS 20.4*  --   --   --   --   --   HGB 9.8* 8.7* 7.3* 8.2* 7.8* 7.5*  HCT 30.0* 26.0* 22.0* 25.1* 23.6* 22.8*  MCV 81.7 81.0 80.6 80.7 79.7 78.9  PLT 311 254 180 223 223 249   Cardiac Enzymes:  Recent Labs Lab 08/14/13 1345  TROPONINI <0.30   CBG:  Recent Labs Lab 08/19/13 0819 08/19/13 1133 08/19/13 1653 08/19/13 2125 08/20/13 0738  GLUCAP 75 115* 154* 128* 119*    Recent Results (from the past 240 hour(s))  URINE CULTURE     Status: None   Collection Time    08/14/13 12:17 AM      Result Value Ref Range Status   Specimen Description URINE, CATHETERIZED   Final   Special Requests NONE   Final   Culture  Setup Time     Final   Value: 08/15/2013 03:29     Performed at [place]     Final   Value: NO GROWTH     Performed at [facility]   Culture     Final   Value: NO GROWTH     Performed at [facility]   Report Status 08/16/2013 FINAL   Final  CULTURE, BLOOD (ROUTINE X 2)     Status: None   Collection Time    08/14/13  2:38 PM      Result Value Ref  Range Status   Specimen Description BLOOD ARM LEFT   Final   Special Requests BOTTLES DRAWN AEROBIC AND ANAEROBIC 5CC   Final   Culture  Setup Time     Final   Value: 08/14/2013 20:43     Performed at [facility]   Culture     Final   Value: ENTEROCOCCUS SPECIES     Note: COMBINATION THERAPY OF HIGH DOSE AMPICILLIN OR VANCOMYCIN, PLUS AN AMINOGLYCOSIDE, IS USUALLY INDICATED FOR SERIOUS ENTEROCOCCAL INFECTIONS.     MORGANELLA MORGANII     Note: Gram Stain Report Called to,Read Back By and Verified With: MORGAN SCHMITZ 08/15/12 0815 BY SMITHERSJ CRITICAL RESULT CALLED TO, READ BACK BY AND VERIFIED WITH: ROSEMARY CLARK 2/29/15 @ 10:46AM BY RUSCOE A.     Performed at [facility]   Report Status 08/18/2013 FINAL   Final   Organism ID, Bacteria ENTEROCOCCUS SPECIES   Final   Organism ID, Bacteria MORGANELLA MORGANII   Final  CULTURE, BLOOD (ROUTINE X 2)     Status: None   Collection Time    08/14/13  2:52 PM      Result Value Ref Range Status   Specimen Description BLOOD RIGHT FOREARM   Final   Special Requests BOTTLES DRAWN AEROBIC AND ANAEROBIC 5CC   Final   Culture  Setup Time     Final   Value: 08/14/2013 20:43     Performed at [facility]   Culture     Final   Value: ENTEROCOCCUS SPECIES     Note: SUSCEPTIBILITIES PERFORMED ON PREVIOUS CULTURE WITHIN THE LAST 5 DAYS.     Note: Gram Stain Report Called to,Read Back By and Verified With: Drema Dallas 08/15/13 0815 BY SMITHERSJ     Performed at [facility]   Report Status 08/17/2013 FINAL   Final  RAPID STREP SCREEN     Status: None   Collection Time    08/15/13 11:42 AM      Result Value Ref Range Status   Streptococcus, Group A Screen (Direct) NEGATIVE  NEGATIVE Final   Comment: (NOTE)     A Rapid Antigen test may result negative if the antigen level in the     sample is below the detection level of this test. The FDA has not     cleared this test as a stand-alone test therefore the rapid  antigen     negative result has reflexed to a Group A Strep culture.  CULTURE, GROUP A STREP     Status: None   Collection Time    08/15/13 11:42 AM      Result Value Ref Range Status   Specimen Description THROAT   Final   Special Requests NONE   Final   Culture     Final   Value: No Beta Hemolytic Streptococci Isolated     Performed at [facility]   Report Status 08/17/2013 FINAL   Final  CULTURE, ROUTINE-ABSCESS     Status: None   Collection Time    08/15/13  6:17 PM      Result Value Ref Range Status   Specimen Description ABSCESS GALL BLADDER   Final   Special Requests RUQ IN THE GB FOSSA,SPECIMEN NO 1   Final   Gram Stain     Final   Value: FEW WBC PRESENT,BOTH PMN AND MONONUCLEAR     NO SQUAMOUS EPITHELIAL CELLS SEEN     RARE GRAM POSITIVE COCCI IN PAIRS     RARE GRAM POSITIVE RODS     Performed at [facility]   Culture     Final   Value: MULTIPLE ORGANISMS PRESENT,  NONE PREDOMINANT     Note: NO STAPHYLOCOCCUS AUREUS ISOLATED NO GROUP A STREP (S.PYOGENES) ISOLATED     Performed at [facility]   Report Status 08/19/2013 FINAL   Final  CULTURE, ROUTINE-ABSCESS     Status: None   Collection Time    08/15/13  6:52 PM      Result Value Ref Range Status   Specimen Description ABSCESS   Final   Special Requests     Final   Value: MID EPIGASTRUM IN THE ANTERIOR PERI GASTRIC SAMPLE NO 2   Gram Stain     Final   Value: ABUNDANT WBC PRESENT, PREDOMINANTLY PMN     NO SQUAMOUS EPITHELIAL CELLS SEEN     ABUNDANT GRAM POSITIVE COCCI IN PAIRS     Performed at [facility]   Culture     Final   Value: ABUNDANT ENTEROCOCCUS SPECIES     Performed at [facility]   Report Status 08/19/2013 FINAL   Final   Organism ID, Bacteria ENTEROCOCCUS SPECIES   Final  CULTURE, BLOOD (ROUTINE X 2)     Status: None   Collection Time    08/16/13  1:50 PM      Result Value Ref Range Status   Specimen Description BLOOD LEFT ARM   Final   Special  Requests BOTTLES DRAWN AEROBIC AND ANAEROBIC 10CC   Final   Culture  Setup Time     Final   Value: 08/16/2013 23:31     Performed at [facility]   Culture     Final   Value:        BLOOD CULTURE RECEIVED NO GROWTH TO DATE CULTURE WILL BE HELD FOR 5 DAYS BEFORE ISSUING A FINAL NEGATIVE REPORT     Performed at [facility]   Report Status PENDING   Incomplete  CULTURE, BLOOD (ROUTINE X 2)     Status: None   Collection Time    08/16/13  1:55 PM      Result Value Ref Range Status   Specimen Description BLOOD LEFT ARM   Final   Special Requests BOTTLES DRAWN AEROBIC AND ANAEROBIC 10CC   Final   Culture  Setup Time     Final   Value: 08/16/2013 23:31     Performed at [facility]   Culture     Final   Value:        BLOOD CULTURE RECEIVED NO GROWTH TO DATE CULTURE WILL BE HELD FOR 5 DAYS BEFORE ISSUING A FINAL NEGATIVE REPORT     Performed at [facility]   Report Status PENDING   Incomplete     Studies:  Recent x-ray studies have been reviewed in detail by the Attending Physician  Scheduled Meds:  Scheduled Meds: . amLODipine  5 mg Oral Daily  . ampicillin (OMNIPEN) IV  2 g Intravenous 4 times per day  . docusate sodium  100 mg Oral BID  . enoxaparin (LOVENOX) injection  40 mg Subcutaneous Q24H  . feeding supplement (ENSURE COMPLETE)  237 mL Oral BID BM  . fluconazole  100 mg Oral Daily  . insulin aspart  0-15 Units Subcutaneous TID WC  . insulin aspart  0-5 Units Subcutaneous QHS  . insulin glargine  5 Units Subcutaneous QHS  . irbesartan  300 mg Oral QHS  . latanoprost  1 drop Both Eyes QHS  . multivitamin with minerals  1 tablet Oral Daily  . nystatin  5 mL Oral  QID  . polyethylene glycol  17 g Oral TID  . predniSONE  5 mg Oral Q breakfast  . tamsulosin  0.4 mg Oral BID   Time spent on care of this patient: 35 min  Dakia Schifano T, MD  [facility] Office  [phone] Pager - Text Page per Shea Evans as per below:  On-Call/Text  Page:      [email or web address]      password [email or web address]  If 7PM-7AM, please contact night-coverage [email or web address] Password [email or web address] 08/20/2013, 10:25 AM   LOS: 6 days

## 2013-08-20 NOTE — Progress Notes (Signed)
Rehab admissions - I spoke with the on site reviewer for [facility].  Given current diagnosis, we cannot get approval for acute inpatient rehab admission.  Options will be SNF placement for continued rehab or [facility] therapies.  Call me for questions.  #[phone]

## 2013-08-20 NOTE — Progress Notes (Signed)
[facility] [place] [facility] for Infectious Disease  Date of Admission:  08/14/2013  Antibiotics: Vancomycin 5 days Zosyn 5 days Ampicillin day 3  Subjective: No complaints  Objective: Temp:  [98.4 F (36.9 C)-98.7 F (37.1 C)] 98.6 F (37 C) (03/04 0737) Pulse Rate:  [83-93] 84 (03/04 0702) Resp:  [14-21] 14 (03/04 0702) BP: (104-148)/(55-78) 144/67 mmHg (03/04 0702) SpO2:  [95 %-98 %] 97 % (03/04 0702) Weight:  [136 lb 11 oz (62 kg)] 136 lb 11 oz (62 kg) (03/04 0524)  General: awake, alert Skin: no rashes Lungs: CTA B Cor: RRR without m Abdomen: Soft, NT, ND, covered incisions.  Ext: no edema  Lab Results Lab Results  Component Value Date   WBC 8.9 08/19/2013   HGB 7.5* 08/19/2013   HCT 22.8* 08/19/2013   MCV 78.9 08/19/2013   PLT 249 08/19/2013    Lab Results  Component Value Date   CREATININE 0.87 08/18/2013   BUN 16 08/18/2013   NA 138 08/18/2013   K 3.8 08/18/2013   CL 106 08/18/2013   CO2 23 08/18/2013    Lab Results  Component Value Date   ALT 43 08/15/2013   AST 57* 08/15/2013   ALKPHOS 246* 08/15/2013   BILITOT 0.4 08/15/2013      Microbiology: Recent Results (from the past 240 hour(s))  URINE CULTURE     Status: None   Collection Time    08/14/13 12:17 AM      Result Value Ref Range Status   Specimen Description URINE, CATHETERIZED   Final   Special Requests NONE   Final   Culture  Setup Time     Final   Value: 08/15/2013 03:29     Performed at [facility]     Final   Value: NO GROWTH     Performed at [facility]   Culture     Final   Value: NO GROWTH     Performed at [facility]   Report Status 08/16/2013 FINAL   Final  CULTURE, BLOOD (ROUTINE X 2)     Status: None   Collection Time    08/14/13  2:38 PM      Result Value Ref Range Status   Specimen Description BLOOD ARM LEFT   Final   Special Requests BOTTLES DRAWN AEROBIC AND ANAEROBIC 5CC   Final   Culture  Setup Time     Final   Value: 08/14/2013 20:43   Performed at [facility]   Culture     Final   Value: ENTEROCOCCUS SPECIES     Note: COMBINATION THERAPY OF HIGH DOSE AMPICILLIN OR VANCOMYCIN, PLUS AN AMINOGLYCOSIDE, IS USUALLY INDICATED FOR SERIOUS ENTEROCOCCAL INFECTIONS.     MORGANELLA MORGANII     Note: Gram Stain Report Called to,Read Back By and Verified With: MORGAN SCHMITZ 08/15/12 0815 BY SMITHERSJ CRITICAL RESULT CALLED TO, READ BACK BY AND VERIFIED WITH: ROSEMARY CLARK 2/29/15 @ 10:46AM BY RUSCOE A.     Performed at [facility]   Report Status 08/18/2013 FINAL   Final   Organism ID, Bacteria ENTEROCOCCUS SPECIES   Final   Organism ID, Bacteria MORGANELLA MORGANII   Final  CULTURE, BLOOD (ROUTINE X 2)     Status: None   Collection Time    08/14/13  2:52 PM      Result Value Ref Range Status   Specimen Description BLOOD RIGHT FOREARM   Final   Special Requests BOTTLES DRAWN  AEROBIC AND ANAEROBIC 5CC   Final   Culture  Setup Time     Final   Value: 08/14/2013 20:43     Performed at [facility]   Culture     Final   Value: ENTEROCOCCUS SPECIES     Note: SUSCEPTIBILITIES PERFORMED ON PREVIOUS CULTURE WITHIN THE LAST 5 DAYS.     Note: Gram Stain Report Called to,Read Back By and Verified With: Drema Dallas 08/15/13 0815 BY SMITHERSJ     Performed at [facility]   Report Status 08/17/2013 FINAL   Final  RAPID STREP SCREEN     Status: None   Collection Time    08/15/13 11:42 AM      Result Value Ref Range Status   Streptococcus, Group A Screen (Direct) NEGATIVE  NEGATIVE Final   Comment: (NOTE)     A Rapid Antigen test may result negative if the antigen level in the     sample is below the detection level of this test. The FDA has not     cleared this test as a stand-alone test therefore the rapid antigen     negative result has reflexed to a Group A Strep culture.  CULTURE, GROUP A STREP     Status: None   Collection Time    08/15/13 11:42 AM      Result Value Ref Range Status    Specimen Description THROAT   Final   Special Requests NONE   Final   Culture     Final   Value: No Beta Hemolytic Streptococci Isolated     Performed at [facility]   Report Status 08/17/2013 FINAL   Final  CULTURE, ROUTINE-ABSCESS     Status: None   Collection Time    08/15/13  6:17 PM      Result Value Ref Range Status   Specimen Description ABSCESS GALL BLADDER   Final   Special Requests RUQ IN THE GB FOSSA,SPECIMEN NO 1   Final   Gram Stain     Final   Value: FEW WBC PRESENT,BOTH PMN AND MONONUCLEAR     NO SQUAMOUS EPITHELIAL CELLS SEEN     RARE GRAM POSITIVE COCCI IN PAIRS     RARE GRAM POSITIVE RODS     Performed at [facility]   Culture     Final   Value: MULTIPLE ORGANISMS PRESENT, NONE PREDOMINANT     Note: NO STAPHYLOCOCCUS AUREUS ISOLATED NO GROUP A STREP (S.PYOGENES) ISOLATED     Performed at [facility]   Report Status 08/19/2013 FINAL   Final  CULTURE, ROUTINE-ABSCESS     Status: None   Collection Time    08/15/13  6:52 PM      Result Value Ref Range Status   Specimen Description ABSCESS   Final   Special Requests     Final   Value: MID EPIGASTRUM IN THE ANTERIOR PERI GASTRIC SAMPLE NO 2   Gram Stain     Final   Value: ABUNDANT WBC PRESENT, PREDOMINANTLY PMN     NO SQUAMOUS EPITHELIAL CELLS SEEN     ABUNDANT GRAM POSITIVE COCCI IN PAIRS     Performed at [facility]   Culture     Final   Value: ABUNDANT ENTEROCOCCUS SPECIES     Performed at [facility]   Report Status 08/19/2013 FINAL   Final   Organism ID, Bacteria ENTEROCOCCUS SPECIES   Final  CULTURE, BLOOD (ROUTINE X 2)  Status: None   Collection Time    08/16/13  1:50 PM      Result Value Ref Range Status   Specimen Description BLOOD LEFT ARM   Final   Special Requests BOTTLES DRAWN AEROBIC AND ANAEROBIC 10CC   Final   Culture  Setup Time     Final   Value: 08/16/2013 23:31     Performed at [facility]   Culture     Final   Value:         BLOOD CULTURE RECEIVED NO GROWTH TO DATE CULTURE WILL BE HELD FOR 5 DAYS BEFORE ISSUING A FINAL NEGATIVE REPORT     Performed at [facility]   Report Status PENDING   Incomplete  CULTURE, BLOOD (ROUTINE X 2)     Status: None   Collection Time    08/16/13  1:55 PM      Result Value Ref Range Status   Specimen Description BLOOD LEFT ARM   Final   Special Requests BOTTLES DRAWN AEROBIC AND ANAEROBIC 10CC   Final   Culture  Setup Time     Final   Value: 08/16/2013 23:31     Performed at [facility]   Culture     Final   Value:        BLOOD CULTURE RECEIVED NO GROWTH TO DATE CULTURE WILL BE HELD FOR 5 DAYS BEFORE ISSUING A FINAL NEGATIVE REPORT     Performed at [facility]   Report Status PENDING   Incomplete    Studies/Results: No results found.  Assessment/Plan: 1) abdominal abscess - Enterococcus in blood cutlures, abscess cultures, drained by IR 2/27, purulent.   Morganella also noted on one blood culture but gram stain noted Gram positive.  Enterococcus is ampicillin sensitive and will target the Enterococcus.  TTE noted and no vegetation.  Will need a prolonged course of IV ampicillin of 4-6 weeks and follow for resolution.  Repeat CT in a day or two per IR.   -I will follow up again Friday  Scharlene Gloss, [place] for Infectious Disease [place] [email or web address] [phone] pager   [phone] cell 08/20/2013, 9:37 AM

## 2013-08-20 NOTE — Consult Note (Signed)
Physical Medicine and Rehabilitation Consult  Reason for Consult: Deconditioning Referring Physician: Dr. Thereasa Solo   HPI: Mike Mack is a 78 y.o. male with history of DM type 2, CVA--CIR stay 03/2012, post polio syndrome with RLE weakness, recent Lap chole 07/25/13 who was admitted from SNF on 08/16/13 with increasing abdominal pain, with fever, chills, poor po intake. He was found to be septic with leukocytosis of 19K and temp of 102.9. CT abdomen showed complex fluid collection at gallbladder bed, multiloculated with 9.4 cm infected biloma vs. Abscess, as well as adjacent fluid collection near left helaptic lobe at 6.5cm and 3.6cm posterior segment of RHL. He underwent IR guided drains placement on 08/15/13 and started on empiric vancomycin and piptazo. Admission blood cx grew enterococcus in 2 sets, ?morganella in one blood cx and abscess fluid positive for enterococcus.  He underwent hida scan that did not show evidence of bile leak. Leucocytosis resolving and ID recommends 4-6 weeks of IV ampicillin with follow up for resolution. Repeat CT of abdomen to be done on tomorrow or Friday. PT evaluation done yesterday and patient noted to be severly deconditioned. Family requesting CIR for further therapies as patient independent prior to 07/2013 surgery.   Patient is tired he states she cannot pull himself up in bed. He does not make an effort to lean forward during lung examination.  Review of Systems  Eyes: Negative for blurred vision.  Respiratory: Positive for cough (for  past few weeks).   Cardiovascular: Negative for chest pain and palpitations.  Gastrointestinal: Negative for nausea and abdominal pain.  Musculoskeletal: Negative for myalgias.  Neurological: Positive for weakness. Negative for headaches.    Past Medical History  Diagnosis Date  . Hypertension   . Small bowel obstruction   . BPH (benign prostatic hyperplasia)   . Neuropathy   . Polio   . Polio Childhood  .  Constipation 05/13/2013  . Anemia 05/13/2013  . Type II diabetes mellitus   . Exertional shortness of breath     "sometimes" (07/25/2013)  . History of stomach ulcers   . Stroke 2014    residual:  "left hand kind of numb" (07/25/2013)  . Arthritis     "right leg" (07/25/2013)   Past Surgical History  Procedure Laterality Date  . Bowel resection    . Cataract extraction w/ intraocular lens  implant, bilateral Bilateral 2000's  . Colonoscopy      Hx: of  . Laparoscopic cholecystectomy  07/25/2013    w/LOA (07/25/2013)  . Excisional hemorrhoidectomy  2000's  . Colon surgery    . Inguinal hernia repair Right 1970's  . Cholecystectomy N/A 07/25/2013    Procedure: LAPAROSCOPIC CHOLECYSTECTOMY WITH INTRAOPERATIVE CHOLANGIOGRAM;  Surgeon: [name], MD;  Location: [facility];  Service: General;  Laterality: N/A;    Family History  Problem Relation Age of Onset  . Diabetes Sister   . Diabetes Sister   . Diabetes Brother   . Hypertension Sister   . Hypertension Sister   . Hypertension Brother   . Heart failure Mother   . Heart attack Sister    Social History:  Married. Independent with cane/walker out of home prior ot 07/25/13. Needed some assistance with ADLs.  reports that he has never smoked. He has never used smokeless tobacco. He reports that he does not drink alcohol or use illicit drugs.  Allergies: No Known Allergies  Medications Prior to Admission  Medication Sig Dispense Refill  . [EXPIRED] Ciprofloxacin (CIPRO IV)  Inject 400 mg into the vein 2 (two) times daily. Med to stop on 08/17/13      . docusate sodium 100 MG CAPS Take 100 mg by mouth 2 (two) times daily.  10 capsule  0  . gabapentin (NEURONTIN) 800 MG tablet Take 800 mg by mouth 3 (three) times daily as needed (HEADACHES).       Marland Kitchen glucagon (GLUCAGEN) 1 MG SOLR injection Inject 1 mg into the vein once as needed for low blood sugar.      . insulin aspart (NOVOLOG) 100 UNIT/ML injection Inject 5 Units into the skin 3 (three)  times daily before meals.       . insulin glargine (LANTUS) 100 units/mL SOLN Inject 5 Units into the skin at bedtime.       . irbesartan (AVAPRO) 300 MG tablet Take 300 mg by mouth at bedtime.      . metroNIDAZOLE (FLAGYL) 5-0.79 MG/ML-% IVPB Inject 500 mg into the vein every 6 (six) hours.      Marland Kitchen nystatin (MYCOSTATIN) 100000 UNIT/ML suspension Take 5 mLs by mouth 2 (two) times daily. For 1 week, then re-access if need to continue, last dose due on 2/26      . polyethylene glycol (MIRALAX / GLYCOLAX) packet Take 17 g by mouth 3 (three) times daily.  14 each  0  . predniSONE (DELTASONE) 5 MG tablet Take 5 mg by mouth daily with breakfast.      . Tamsulosin HCl (FLOMAX) 0.4 MG CAPS Take 0.4 mg by mouth 2 (two) times daily.       . Travoprost, BAK Free, (TRAVATAN) 0.004 % SOLN ophthalmic solution Place 1 drop into both eyes at bedtime.        Home: Home Living Family/patient expects to be discharged to:: Skilled nursing facility Additional Comments: pt plans on return to SNF. was at SNF since 2/11  Functional History:   Functional Status:  Mobility:          ADL:    Cognition: Cognition Overall Cognitive Status: Within Functional Limits for tasks assessed Orientation Level: Oriented to person;Oriented to place;Oriented to situation Cognition Arousal/Alertness: Awake/alert Behavior During Therapy: WFL for tasks assessed/performed Overall Cognitive Status: Within Functional Limits for tasks assessed  Blood pressure 144/67, pulse 84, temperature 98.6 F (37 C), temperature source Oral, resp. rate 14, height 5\' 5"  (1.651 m), weight 62 kg (136 lb 11 oz), SpO2 97.00%. Physical Exam  Nursing note and vitals reviewed. Constitutional: He is oriented to person, place, and time. He appears cachectic.  Frail appearing  HENT:  Head: Normocephalic and atraumatic.  Eyes: Conjunctivae are normal. Pupils are equal, round, and reactive to light.  Neck: Normal range of motion. Neck supple.    Cardiovascular: Normal rate and regular rhythm.   Respiratory: Effort normal. He has decreased breath sounds in the right lower field and the left lower field.  Persistent dry cough  GI: He exhibits distension. There is no tenderness. There is no rebound.  Decreased BS. Minimal drainage in RUQ drains.   Musculoskeletal: He exhibits no edema and no tenderness.  RLE with severe muscle wasting and well healed old incision right knee.   Neurological: He is alert and oriented to person, place, and time.  Weak dysphonic voice. Able to follow basic commands without difficulty.   Skin: Skin is warm and dry.   4/5 right deltoid, bicep, tricep, grip 5/5 left deltoid, bicep, tricep, grip 2 minus in the right hip flexor knee extensor ankle dorsiflexor  plantar flexion 4 minus in the left hip flexor knee extensor ankle dyslexia plantar flexor Sensation intact to light touch in bilateral upper and lower limbs  Results for orders placed during the hospital encounter of 08/14/13 (from the past 24 hour(s))  GLUCOSE, CAPILLARY     Status: Abnormal   Collection Time    08/19/13 11:33 AM      Result Value Ref Range   Glucose-Capillary 115 (*) 70 - 99 mg/dL  GLUCOSE, CAPILLARY     Status: Abnormal   Collection Time    08/19/13  4:53 PM      Result Value Ref Range   Glucose-Capillary 154 (*) 70 - 99 mg/dL  GLUCOSE, CAPILLARY     Status: Abnormal   Collection Time    08/19/13  9:25 PM      Result Value Ref Range   Glucose-Capillary 128 (*) 70 - 99 mg/dL   Comment 1 Notify RN     Comment 2 Documented in Chart    GLUCOSE, CAPILLARY     Status: Abnormal   Collection Time    08/20/13  7:38 AM      Result Value Ref Range   Glucose-Capillary 119 (*) 70 - 99 mg/dL   Comment 1 Notify RN     Comment 2 Documented in Chart     No results found.  Assessment/Plan: Diagnosis: Severe deconditioning, secondary to sepsis after peritonitis in a patient with right monoparesis from childhood polio 1. Does the  need for close, 24 hr/day medical supervision in concert with the patient's rehab needs make it unreasonable for this patient to be served in a less intensive setting? Potentially 2. Co-Morbidities requiring supervision/potential complications: History of prior CVA left thalamic, hepatic abscess, recent cholecystectomy 3. Due to bladder management, bowel management, safety, skin/wound care, disease management, medication administration, pain management and patient education, does the patient require 24 hr/day rehab nursing? Potentially 4. Does the patient require coordinated care of a physician, rehab nurse, PT (1-2 hrs/day, 5 days/week) and OT (1-2 hrs/day, 5 days/week) to address physical and functional deficits in the context of the above medical diagnosis(es)? Potentially Addressing deficits in the following areas: balance, endurance, locomotion, strength, transferring, bowel/bladder control, bathing, dressing, feeding, grooming and toileting 5. Can the patient actively participate in an intensive therapy program of at least 3 hrs of therapy per day at least 5 days per week? No 6. The potential for patient to make measurable gains while on inpatient rehab is N/A 7. Anticipated functional outcomes upon discharge from inpatient rehab are N/A with PT, NA with OT, NA with SLP. 8. Estimated rehab length of stay to reach the above functional goals is: NA 9. Does the patient have adequate social supports to accommodate these discharge functional goals? Potentially 10. Anticipated D/C setting: Home 11. Anticipated post D/C treatments: [facility] therapy 12. Overall Rehab/Functional Prognosis: good  RECOMMENDATIONS: This patient's condition is appropriate for continued rehabilitative care in the following setting: CIR once able to participate with PT and OT on a consistent basis. Patient has agreed to participate in recommended program. Potentially Note that insurance prior authorization may be required for  reimbursement for recommended care.  Comment: Would recommend completing any further invasive procedures prior to CIR Rehabilitation RN to monitor therapy participation    08/20/2013

## 2013-08-20 NOTE — Progress Notes (Signed)
CSW spoke with wife [name] [phone] regarding return to SNF at discharge. Wife states that she prefers that husband receive CIR at [facility]. If that is not an option, wife prefers that wife return to [facility]. CSW informed wife that she would speak to MD about CIR consult. Wife agreeable.  CSW informed MD that patient and wife interested in [place]. MD to order CIR consult.   CSW to follow for discharge planning back to [facility] if patient is not accepted to CIR.  Tilden Fossa, MSW, [place] Social Worker [phone]

## 2013-08-20 NOTE — Evaluation (Signed)
Physical Therapy Evaluation Patient Details Name: Gil Ingwersen MRN: [id number] DOB: 28-Dec-1935 Today's Date: 08/20/2013 Time: 5102-5852 PT Time Calculation (min): 30 min  PT Assessment / Plan / Recommendation History of Present Illness  pt has had lap chole surgery 07/25/13. Pt returned from SNF with n/v/fever and abd pain. Pt found on ct to have biloma arising from surgical bed; posterior Rt hepatic lobe abscess; L hepatic lobe abscess extending beneath abd wall.   Clinical Impression  Pt with severe deconditioning and generalized weakness in addition to R sided residual weakness from previous CVA and h/o polio. Pt requires maxAx2 for safe mobility. Pt motivated and con't to benefit from SNF upon d/c to achieve maximal functional recovery.    PT Assessment  Patient needs continued PT services    Follow Up Recommendations  SNF    Does the patient have the potential to tolerate intense rehabilitation      Barriers to Discharge        Equipment Recommendations       Recommendations for Other Services     Frequency Min 3X/week    Precautions / Restrictions Precautions Precautions: Fall Precaution Comments: 2 JP drains in abd Restrictions Weight Bearing Restrictions: No   Pertinent Vitals/Pain Denies pain      Mobility  Bed Mobility Overal bed mobility: Needs Assistance Bed Mobility: Supine to Sit Supine to sit: Max assist General bed mobility comments: A for trunk elevation and to bring hips to EOB. pt able to move LEs to EOB and use L UE to assist Transfers Overall transfer level: Needs assistance Equipment used:  (2 person dependent lift using bed pad) Transfers: Sit to/from [employer] Transfers Sit to Stand: Max assist;+2 physical assistance Stand pivot transfers: Total assist;+2 physical assistance General transfer comment: pt unable to step with R LE and required maxA to advance LE Ambulation/Gait Ambulation/Gait assistance:  (pt unable at this time)     Exercises General Exercises - Lower Extremity Ankle Circles/Pumps: AROM;10 reps;Seated Quad Sets: AROM;Left;10 reps Gluteal Sets: AROM;Both;10 reps   PT Diagnosis: Difficulty walking;Generalized weakness  PT Problem List: Decreased strength;Decreased activity tolerance;Decreased balance;Decreased mobility PT Treatment Interventions: DME instruction;Gait training;Functional mobility training;Therapeutic activities;Therapeutic exercise;Balance training     PT Goals(Current goals can be found in the care plan section) Acute Rehab PT Goals PT Goal Formulation: With patient Time For Goal Achievement: 09/03/13 Potential to Achieve Goals: Fair  Visit Information  Last PT Received On: 08/20/13 Assistance Needed: +2 History of Present Illness: pt has had lap chole surgery 07/25/13. Pt returned from SNF with n/v/fever and abd pain. Pt found on ct to have biloma arising from surgical bed; posterior Rt hepatic lobe abscess; L hepatic lobe abscess extending beneath abd wall.        Prior [place] expects to be discharged to:: Skilled nursing facility Additional Comments: pt plans on return to SNF. was at [facility] since 2/11 Prior Function Level of Independence: Needs assistance Gait / Transfers Assistance Needed: pt was using cane and RW prior to surgery 2/6 but hasn't been able to amb at SNF since surgery. only transfering to chair with 2 person assist ADL's / Homemaking Assistance Needed: assist fro SNF staff Communication Communication: No difficulties Dominant Hand: Right    Cognition  Cognition Arousal/Alertness: Awake/alert Behavior During Therapy: WFL for tasks assessed/performed Overall Cognitive Status: Within Functional Limits for tasks assessed    Extremity/Trunk Assessment Upper Extremity Assessment Upper Extremity Assessment: RUE deficits/detail RUE Deficits / Details: pt with 2/5  tricep, 3+/5 biceps Lower Extremity Assessment Lower Extremity  Assessment: RLE deficits/detail RLE Deficits / Details: Pt with polio as a child, and has significant muscle atrophy and weakness in the quads, hamstrings, and gastroc/soleus complex Cervical / Trunk Assessment Cervical / Trunk Assessment: Kyphotic   Balance Balance Overall balance assessment: Needs assistance Sitting-balance support: Feet supported;Single extremity supported Sitting balance-Leahy Scale: Poor Sitting balance - Comments: pt unable to maintain upright position, pt with R lateral and posterior lean Standing balance-Leahy Scale: Zero Standing balance comment: pt dependent for static standing, max v/c's to attempt to achieve trunk upright  End of Session PT - End of Session Equipment Utilized During Treatment: Gait belt Activity Tolerance: Patient limited by fatigue Patient left: in chair;with call bell/phone within reach Nurse Communication: Mobility status (how to complete dependent lift using bed pad to stand pvt back to bed or they could used hoyer lift)  GP     Kingsley Callander 08/20/2013, 9:35 AM  Kittie Plater, PT, DPT Pager #: [phone] Office #: [phone]

## 2013-08-20 NOTE — Progress Notes (Addendum)
CSW left voicemail for CIR admissions coordinator regarding patient referral.  CSW spoke with Demetrius at [facility] [phone] who confirmed that facility could accept patient back this week if discharged.   Tilden Fossa, MSW, [place] Social Worker [phone]

## 2013-08-20 NOTE — Progress Notes (Signed)
Pt tx 6N per MD order, pt VSS, pt and family verbalized understanding of tx, report called to receiving RN all questions answered

## 2013-08-20 NOTE — Progress Notes (Signed)
  Subjective: Pt states he feels a little better each day On full liquids diet.  Objective: Vital signs in last 24 hours: Temp:  [98.4 F (36.9 C)-98.7 F (37.1 C)] 98.6 F (37 C) (03/04 0737) Pulse Rate:  [83-93] 88 (03/04 0524) Resp:  [14-21] 16 (03/04 0524) BP: (104-148)/(55-78) 145/70 mmHg (03/04 0524) SpO2:  [95 %-98 %] 98 % (03/04 0524) Weight:  [136 lb 11 oz (62 kg)] 136 lb 11 oz (62 kg) (03/04 0524) Last BM Date: 08/19/13 RUQ drains intact, sites clean, NT  #1 drain(RUQ) still with cloudy output, 70mL  $2 drain (midline) with serous output 12mL   Lab Results:   Recent Labs  08/18/13 0308 08/19/13 0237  WBC 10.3 8.9  HGB 7.8* 7.5*  HCT 23.6* 22.8*  PLT 223 249   BMET  Recent Labs  08/18/13 0308  NA 138  K 3.8  CL 106  CO2 23  GLUCOSE 126*  BUN 16  CREATININE 0.87  CALCIUM 7.9*   PT/INR  Recent Labs  08/18/13 0308  LABPROT 16.3*  INR 1.34   ABG No results found for this basename: PHART, PCO2, PO2, HCO3,  in the last 72 hours  Studies/Results: No results found.  Anti-infectives: Anti-infectives   Start     Dose/Rate Route Frequency Ordered Stop   08/18/13 1645  fluconazole (DIFLUCAN) tablet 100 mg     100 mg Oral Daily 08/18/13 1641     08/18/13 1200  ampicillin (OMNIPEN) 2 g in sodium chloride 0.9 % 50 mL IVPB    Comments:  Pharmacy may adjust as needed   2 g 150 mL/hr over 20 Minutes Intravenous 4 times per day 08/18/13 1049     08/17/13 1000  fluconazole (DIFLUCAN) IVPB 100 mg  Status:  Discontinued     100 mg 50 mL/hr over 60 Minutes Intravenous Every 24 hours 08/17/13 0936 08/18/13 1641   08/16/13 1100  vancomycin (VANCOCIN) 500 mg in sodium chloride 0.9 % 100 mL IVPB  Status:  Discontinued     500 mg 100 mL/hr over 60 Minutes Intravenous Every 12 hours 08/16/13 1016 08/18/13 1049   08/15/13 1200  vancomycin (VANCOCIN) 1,250 mg in sodium chloride 0.9 % 250 mL IVPB  Status:  Discontinued     1,250 mg 166.7 mL/hr over 90 Minutes  Intravenous Every 24 hours 08/14/13 1911 08/16/13 1016   08/15/13 0000  piperacillin-tazobactam (ZOSYN) IVPB 3.375 g  Status:  Discontinued     3.375 g 12.5 mL/hr over 240 Minutes Intravenous Every 8 hours 08/14/13 1911 08/18/13 1049   08/14/13 1530  vancomycin (VANCOCIN) IVPB 1000 mg/200 mL premix     1,000 mg 200 mL/hr over 60 Minutes Intravenous  Once 08/14/13 1526 08/14/13 1823   08/14/13 1530  piperacillin-tazobactam (ZOSYN) IVPB 3.375 g     3.375 g 100 mL/hr over 30 Minutes Intravenous  Once 08/14/13 1526 08/14/13 1603      Assessment/Plan: s/p GB fossa/epigastric abscess drainages 2/27;  No new labs today Cont drain flushes. Consider repeat tomorrow or Fri to reassess. May be able to remove at least drain #2  Ascencion Dike 08/20/2013

## 2013-08-21 DIAGNOSIS — K750 Abscess of liver: Secondary | ICD-10-CM

## 2013-08-21 DIAGNOSIS — I10 Essential (primary) hypertension: Secondary | ICD-10-CM

## 2013-08-21 LAB — CBC
HCT: 21.5 % — ABNORMAL LOW (ref 39.0–52.0)
Hemoglobin: 7.1 g/dL — ABNORMAL LOW (ref 13.0–17.0)
MCH: 26 pg (ref 26.0–34.0)
MCHC: 33 g/dL (ref 30.0–36.0)
MCV: 78.8 fL (ref 78.0–100.0)
PLATELETS: 325 10*3/uL (ref 150–400)
RBC: 2.73 MIL/uL — AB (ref 4.22–5.81)
RDW: 16.9 % — ABNORMAL HIGH (ref 11.5–15.5)
WBC: 8.5 10*3/uL (ref 4.0–10.5)

## 2013-08-21 LAB — GLUCOSE, CAPILLARY
GLUCOSE-CAPILLARY: 144 mg/dL — AB (ref 70–99)
GLUCOSE-CAPILLARY: 154 mg/dL — AB (ref 70–99)
Glucose-Capillary: 91 mg/dL (ref 70–99)
Glucose-Capillary: 166 mg/dL — ABNORMAL HIGH (ref 70–99)

## 2013-08-21 LAB — COMPREHENSIVE METABOLIC PANEL
ALBUMIN: 1.6 g/dL — AB (ref 3.5–5.2)
ALT: 36 U/L (ref 0–53)
AST: 29 U/L (ref 0–37)
Alkaline Phosphatase: 186 U/L — ABNORMAL HIGH (ref 39–117)
BILIRUBIN TOTAL: 0.2 mg/dL — AB (ref 0.3–1.2)
BUN: 6 mg/dL (ref 6–23)
CHLORIDE: 99 meq/L (ref 96–112)
CO2: 25 meq/L (ref 19–32)
Calcium: 8 mg/dL — ABNORMAL LOW (ref 8.4–10.5)
Creatinine, Ser: 0.75 mg/dL (ref 0.50–1.35)
GFR calc Af Amer: >90 mL/min (ref >90)
GFR calc non Af Amer: 88 mL/min — ABNORMAL LOW (ref >90)
Glucose, Bld: 91 mg/dL (ref 70–99)
Potassium: 3.3 mEq/L — ABNORMAL LOW (ref 3.7–5.3)
SODIUM: 136 meq/L — AB (ref 137–147)
Total Protein: 6.1 g/dL (ref 6.0–8.3)

## 2013-08-21 MED ORDER — GUAIFENESIN 100 MG/5ML PO SYRP
200.0000 mg | ORAL_SOLUTION | ORAL | Status: DC | PRN
  Administered 2013-08-22 (×3): 200 mg via ORAL
  Filled 2013-08-21 (×4): qty 10

## 2013-08-21 MED ORDER — DM-GUAIFENESIN ER 30-600 MG PO TB12
1.0000 | ORAL_TABLET | Freq: Two times a day (BID) | ORAL | Status: DC
  Administered 2013-08-21 – 2013-08-27 (×12): 1 via ORAL
  Filled 2013-08-21 (×13): qty 1

## 2013-08-21 NOTE — Evaluation (Signed)
Occupational Therapy Evaluation Patient Details Name: Mike Mack MRN: [id number] DOB: Dec 19, 1935 Today's Date: 08/21/2013 Time: 7628-3151 OT Time Calculation (min): 35 min  OT Assessment / Plan / Recommendation History of present illness Pt has had lap chole surgery 07/25/13. Pt returned from SNF with n/v/fever and abd pain. Pt found on ct to have biloma arising from surgical bed; posterior Rt hepatic lobe abscess; L hepatic lobe abscess extending beneath abd wall.    Clinical Impression   Pt admitted as above presenting w/ significant overall deconditioning impacting his ability to perform ADL's and functional transfers.Pt w/ prior h/o CVA w/ R sided weakness & polio. Will follow acutely for OT to assist in maximizing independence w/ ADL's & functional transfers.     OT Assessment  Patient needs continued OT Services    Follow Up Recommendations  SNF;Supervision/Assistance - 24 hour    Barriers to Discharge      Equipment Recommendations  Other (comment);None recommended by OT (Defer to next venue)    Recommendations for Other Services    Frequency  Min 2X/week    Precautions / Restrictions Precautions Precautions: Fall Precaution Comments: 2 JP drains in abd Restrictions Weight Bearing Restrictions: No   Pertinent Vitals/Pain Pt did not rate rate, denied pain, however w/ facial grimmace w/ bed mobility and c/o "I'm sore" when LE's were moved. Repositioned, resting at conclusion of session.   ADL  Eating/Feeding: Performed;Set up Where Assessed - Eating/Feeding: Bed level Grooming: Performed;Wash/dry hands;Wash/dry face;Set up Where Assessed - Grooming: Supine, head of bed up Upper Body Bathing: Performed;Chest;Right arm;Left arm;Set up;Minimal assistance Where Assessed - Upper Body Bathing: Supine, head of bed up Lower Body Bathing: Performed;Maximal assistance;+1 Total assistance Where Assessed - Lower Body Bathing: Supine, head of bed flat Upper Body Dressing:  Performed;Moderate assistance Where Assessed - Upper Body Dressing: Supine, head of bed flat Lower Body Dressing: +1 Total assistance;+2 Total assistance;Simulated Where Assessed - Lower Body Dressing: Supine, head of bed flat;Rolling right and/or left Toilet Transfer Equipment: Drop arm bedside commode Toileting - Clothing Manipulation and Hygiene: Simulated;+2 Total assistance Where Assessed - Toileting Clothing Manipulation and Hygiene: Supine, head of bed flat;Rolling right and/or left Tub/Shower Transfer Method: Not assessed Transfers/Ambulation Related to ADLs: Pt overall +2 total assist for bed mobility and sit EOB x2 min. Pt w/ strong posterior lean in sitting EOB & unable to shift weight forward/lean. Pt did use bilateral UE's to assist with sitting and bed mobility today however. ADL Comments: Pt presents w/ significant overall deconditioning impacting his ability to perform ADL's and functional transfers. Pt overall +1-2 total assist for bed mobility and sitting EOB today. Pt was educated verbally in role of OT & participated in ADL retraining session bed level secondary to inability to sit EOB >2 min given +2 total assist and posterior lean.     OT Diagnosis: Generalized weakness;Acute pain;Other (comment) (R sided weakness secondary to h/o CVA & polio)  OT Problem List: Decreased strength;Decreased activity tolerance;Impaired balance (sitting and/or standing);Impaired vision/perception;Decreased knowledge of precautions;Decreased knowledge of use of DME or AE;Decreased coordination;Pain;Impaired UE functional use OT Treatment Interventions: Self-care/ADL training;Therapeutic exercise;DME and/or AE instruction;Patient/family education;Visual/perceptual remediation/compensation;Therapeutic activities;Balance training   OT Goals(Current goals can be found in the care plan section) Acute Rehab OT Goals Patient Stated Goal: Get better, I'm so weak Time For Goal Achievement:  09/04/13 Potential to Achieve Goals: Fair  Visit Information  Last OT Received On: 08/21/13 Assistance Needed: +2 History of Present Illness: pt has had lap chole surgery 07/25/13.  Pt returned from SNF with n/v/fever and abd pain. Pt found on ct to have biloma arising from surgical bed; posterior Rt hepatic lobe abscess; L hepatic lobe abscess extending beneath abd wall.        Prior Bryan expects to be discharged to:: Skilled nursing facility Additional Comments: pt plans on return to SNF. was at [facility] since 2/11 Prior Function Level of Independence: Needs assistance Gait / Transfers Assistance Needed: pt was using cane and RW prior to surgery 2/6 but hasn't been able to amb at SNF since surgery. only transfering to chair with 2 person assist ADL's / Homemaking Assistance Needed: assist fro SNF staff Communication Communication: No difficulties Dominant Hand: Right    Vision/Perception Vision - History Baseline Vision: Wears glasses only for reading Vision - Assessment Vision Assessment: Vision not tested   Cognition  Cognition Arousal/Alertness: Awake/alert Behavior During Therapy: WFL for tasks assessed/performed Overall Cognitive Status: No family/caregiver present to determine baseline cognitive functioning (Pt states year of birth as [birth year] x2, actually [birth year])    Extremity/Trunk Assessment Upper Extremity Assessment Upper Extremity Assessment: RUE deficits/detail RUE Deficits / Details: pt with 2+/5 tricep, 3+/5 biceps Lower Extremity Assessment Lower Extremity Assessment: Defer to PT evaluation RLE Deficits / Details: Pt with polio as a child, and has significant muscle atrophy and weakness in the quads, hamstrings, and gastroc/soleus complex Cervical / Trunk Assessment Cervical / Trunk Assessment: Kyphotic     Mobility Bed Mobility Overal bed mobility: Needs Assistance Bed Mobility: Supine to Sit;Sit to Supine Supine to sit: Total  assist;+2 for physical assistance;HOB elevated Sit to supine: Total assist;+2 for physical assistance General bed mobility comments: A for trunk elevation and to bring hips to EOB. pt able to move LEs to EOB and use L UE to assist. Pt able to return to bed using rails and bed inverted for gravity assist and repositioning.        Balance Balance Overall balance assessment: Needs assistance Sitting-balance support: Feet supported;Bilateral upper extremity supported Sitting balance-Leahy Scale: Poor Sitting balance - Comments: pt unable to maintain upright position, pt with significant R lateral and posterior lean. Pt only able to sit ~2 min given total assist +1-2. Postural control: Posterior lean;Right lateral lean   End of Session OT - End of Session Activity Tolerance: Patient limited by fatigue;Patient limited by lethargy Patient left: in bed;with call bell/phone within reach;with nursing/sitter in room Nurse Communication: Mobility status;Other (comment) (ADL session)  GO     Almyra Deforest 08/21/2013, 8:49 AM

## 2013-08-21 NOTE — Progress Notes (Signed)
   CARE MANAGEMENT NOTE 08/21/2013  Patient:  Mike Mack, Mike Mack   Account Number:  [id number]  Date Initiated:  08/15/2013  Documentation initiated by:  Marvetta Gibbons  Subjective/Objective Assessment:   Pt admitted with abd pain s/p lap chole- now with CT showing abscess     Action/Plan:   PTA pt was in rehab at [place] aware and following for d/c needs  for need to  return to SNF   Anticipated DC Date:  08/22/2013   Anticipated DC Plan:    In-house referral  Clinical Social Worker      DC Planning Services  CM consult      Choice offered to / List presented to:             Status of service:  In process, will continue to follow Medicare Important Message given?   (If response is "NO", the following Medicare IM given date fields will be blank) Date Medicare IM given:   Date Additional Medicare IM given:    Discharge Disposition:    Per UR Regulation:  Reviewed for med. necessity/level of care/duration of stay  If discussed at [place] of Stay Meetings, dates discussed:   08/19/2013  08/21/2013    Comments:  08/20/13- 1600- Marvetta Gibbons RN, BSN [phone] Per note in chart from [place] admission RN- Genie- pt's insurance will not approve CIR at this time and pt will need to return to SNF - spoke with pt and wife at bedside- to inform them that CIR would not be an option and that we would need to continue to plan for SNF at discharge- per wife- [name] holding bed- and it will be their plan to return there since CIR is not an option- CSW alerted that SNF will be the d/c plan and possible d/c on Friday pending repeat CT scan and PICC line placement.  08/19/13- 1430- Marvetta Gibbons RN, BSN [phone] Pt will need 4-6 wks of IV abx- spoke with Dr. Wynelle Cleveland regarding PICC line- to place order today. PT eval pending- Pt from SNF-

## 2013-08-21 NOTE — Progress Notes (Signed)
Subjective: Patient denies any pain today. He is eating breakfast currently and denies any N/V. He denies any fever or chills.   Objective: Physical Exam: BP 155/69  Pulse 96  Temp(Src) 98.4 F (36.9 C) (Oral)  Resp 16  Ht 5\' 5"  (1.651 m)  Wt 138 lb 11.2 oz (62.914 kg)  BMI 23.08 kg/m2  SpO2 93%  General: A&Ox3, NAD, sitting up in bed eating. Abd: Soft, NT, ND, (+) BS Drain #1 RUQ dressing intact, output thick and bloody 15cc Drain #2 Midline dressing intact, output serous 8cc  Labs: CBC  Recent Labs  08/19/13 0237 08/21/13 0557  WBC 8.9 8.5  HGB 7.5* 7.1*  HCT 22.8* 21.5*  PLT 249 325   BMET  Recent Labs  08/21/13 0557  NA 136*  K 3.3*  CL 99  CO2 25  GLUCOSE 91  BUN 6  CREATININE 0.75  CALCIUM 8.0*   LFT  Recent Labs  08/21/13 0557  PROT 6.1  ALBUMIN 1.6*  AST 29  ALT 36  ALKPHOS 186*  BILITOT 0.2*   PT/INR No results found for this basename: LABPROT, INR,  in the last 72 hours   Studies/Results: No results found.  Assessment/Plan: S/p gallbladder fossa/epigastric abscess drain x 2 placement in IR 2/27 Consider repeat CT today or tomorrow, low output.  Plans per surgery.   LOS: 7 days    Mike Mack 08/21/2013 10:52 AM

## 2013-08-21 NOTE — Progress Notes (Signed)
Progress Note    Mike Mack [id number] DOB: 1935-09-18 DOA: 08/14/2013 PCP: [name], MD  Brief narrative: 78 y.o. male who presented on 08/14/2013 with a hx of HTN, HLD, prior stroke, recent laparoscopic cholecystectomy 07/25/2013 by [name] (discharged to [facility] 07/30/2013) with several days duration of persistent epigastric abdominal pain associated with fevers, chills, and malaise.   In the ED CT abdomen was notable for abscess in the gallbladder bed, as well as peri-hepatic abscesses. Surgery was consulted and [facility] was asked to admit for further evaluation. Pt started on Vancomycin and Zosyn in ED.  Subjective: Patient denies abdominal pain, complaining of cough today-nonproductive. Reports nausea but no vomiting  Assessment/Plan:  Severe sepsis /  Enterococcus + ?Morganella bacteremia / subhepatic abscess /  epigastric abscess  - s/p lap cholecystectomy 07/25/2013 - now with 2 perc drains being managed by IR - bacteremia with enterococcus and ?morganella - ID has decided to focus on the Enterococcus (gram stain results not suggestive of active Morganella infection) - IV ampicillin for 4-6 weeks and follow for resolution - repeat CT abdom Thurs or Fri per IR - pt is improving steadily on his current tx regimen - repeat cultures negative  - Still awaiting PICC line(pending wife's consent)  Diabetes mellitus with neuropathy - ok control at present, follow   Hypertension -Continue current meds, follow.  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 - given 1 U FFP prior to procedure - f/u normal - recheck on Friday   Code Status: FULL Family Communication: Wife at bedside Disposition Plan: SNF when medically ready - needs f/u CT/drain checks as well as PICC for long term abx dosing   Consultants: Gen Surg  IR ID  Procedures: 2/27 - abdom abscess drain x2 in IR  2/28 - TTE - mild LVH - EF 55-60% - no WMA - grade 1 DD  Antibiotics: Ampicillin 3/02 >> Diflucan 3/01 >>  Zosyn 2/26 >> 3/02 Vanc 2/26 >> 3/01  DVT prophylaxis: Lovenox  Objective: Filed Weights   08/19/13 0331 08/20/13 0524 08/21/13 0609  Weight: 64.2 kg (141 lb 8.6 oz) 62 kg (136 lb 11 oz) 62.914 kg (138 lb 11.2 oz)   Blood pressure 132/63, pulse 96, temperature 98.7 F (37.1 C), temperature source Oral, resp. rate 18, height 5\' 5"  (1.651 m), weight 62.914 kg (138 lb 11.2 oz), SpO2 99.00%.  Intake/Output Summary (Last 24 hours) at 08/21/13 1502 Last data filed at 08/21/13 1412  Gross per 24 hour  Intake    720 ml  Output   2358 ml  Net  -  1638 ml   Exam: General: No acute respiratory distress - more alert and engaging today  Lungs: Clear to auscultation bilaterally without wheezes or crackles Cardiovascular: Regular rate and rhythm without murmur gallop or rub  Abdomen: 2 drains on right side intact w/ no d/c or erythema at insertion sites, nondistended, soft, bowel sounds positive, no rebound, no ascites, no appreciable mass Extremities: No significant cyanosis, clubbing, or edema bilateral lower extremities  Data Reviewed: Basic Metabolic Panel:  Recent Labs Lab 08/14/13 2200  08/15/13 0233 08/15/13 1836 08/18/13 0308 08/21/13 0557  NA  --  140 144 138 136*  K  --  4.8 4.2 3.8 3.3*  CL  --  106 107 106 99  CO2  --  26 26 23 25   GLUCOSE  --  238* 218* 126* 91  BUN  --  29* 31* 16 6  CREATININE  --  1.04 1.18 0.87 0.75  CALCIUM  --  8.0* 8.1* 7.9* 8.0*  MG 2.1  --   --   --   --   PHOS 3.1  --   --   --   --    Liver Function Tests:  Recent Labs Lab 08/15/13 0233 08/21/13 0557  AST 57* 29  ALT 43 36  ALKPHOS 246* 186*  BILITOT 0.4 0.2*  PROT 6.6 6.1  ALBUMIN 1.8* 1.6*   CBC:  Recent Labs Lab 08/16/13 0245 08/17/13 0905 08/18/13 0308 08/19/13 0237 08/21/13 0557  WBC 11.8* 11.9* 10.3 8.9 8.5  HGB 7.3* 8.2* 7.8* 7.5* 7.1*  HCT 22.0* 25.1* 23.6* 22.8* 21.5*  MCV 80.6 80.7 79.7 78.9 78.8  PLT 180 223 223 249 325   Cardiac Enzymes: No results found for this basename: CKTOTAL, CKMB, CKMBINDEX, TROPONINI,  in the last 168 hours CBG:  Recent Labs Lab 08/20/13 1151 08/20/13 1643 08/20/13 2151 08/21/13 0845 08/21/13 1219  GLUCAP 146* 165* 113* 91 144*    Recent Results (from the past 240 hour(s))  URINE CULTURE     Status: None   Collection Time    08/14/13 12:17 AM      Result Value Ref Range Status   Specimen Description URINE, CATHETERIZED   Final   Special Requests NONE   Final   Culture  Setup Time     Final   Value: 08/15/2013 03:29     Performed at [place]     Final   Value: NO GROWTH     Performed at [facility]   Culture     Final   Value: NO GROWTH     Performed at [facility]   Report Status 08/16/2013 FINAL   Final  CULTURE, BLOOD (ROUTINE X 2)     Status: None   Collection Time    08/14/13  2:38 PM      Result Value Ref Range Status   Specimen Description BLOOD ARM LEFT   Final   Special Requests BOTTLES DRAWN AEROBIC AND ANAEROBIC 5CC   Final   Culture  Setup Time     Final   Value: 08/14/2013 20:43     Performed at [facility]   Culture     Final    Value: ENTEROCOCCUS SPECIES     Note: COMBINATION THERAPY OF HIGH DOSE AMPICILLIN OR VANCOMYCIN, PLUS AN AMINOGLYCOSIDE, IS USUALLY INDICATED FOR SERIOUS ENTEROCOCCAL INFECTIONS.     MORGANELLA MORGANII     Note: Gram Stain Report Called to,Read Back By and  Verified With: [facility] 08/15/12 0815 BY SMITHERSJ CRITICAL RESULT CALLED TO, READ BACK BY AND VERIFIED WITH: ROSEMARY CLARK 2/29/15 @ 10:46AM BY RUSCOE A.     Performed at [facility]   Report Status 08/18/2013 FINAL   Final   Organism ID, Bacteria ENTEROCOCCUS SPECIES   Final   Organism ID, Bacteria MORGANELLA MORGANII   Final  CULTURE, BLOOD (ROUTINE X 2)     Status: None   Collection Time    08/14/13  2:52 PM      Result Value Ref Range Status   Specimen Description BLOOD RIGHT FOREARM   Final   Special Requests BOTTLES DRAWN AEROBIC AND ANAEROBIC 5CC   Final   Culture  Setup Time     Final   Value: 08/14/2013 20:43     Performed at [facility]   Culture     Final   Value: ENTEROCOCCUS SPECIES     Note: SUSCEPTIBILITIES PERFORMED ON PREVIOUS CULTURE WITHIN THE LAST 5 DAYS.     Note: Gram Stain Report Called to,Read Back By and Verified With: Drema Dallas 08/15/13 0815 BY SMITHERSJ     Performed at [facility]   Report Status 08/17/2013 FINAL   Final  RAPID STREP SCREEN     Status: None   Collection Time    08/15/13 11:42 AM      Result Value Ref Range Status   Streptococcus, Group A Screen (Direct) NEGATIVE  NEGATIVE Final   Comment: (NOTE)     A Rapid Antigen test may result negative if the antigen level in the     sample is below the detection level of this test. The FDA has not     cleared this test as a stand-alone test therefore the rapid antigen     negative result has reflexed to a Group A Strep culture.  CULTURE, GROUP A STREP     Status: None   Collection Time    08/15/13 11:42 AM      Result Value Ref Range Status   Specimen Description THROAT   Final   Special Requests NONE    Final   Culture     Final   Value: No Beta Hemolytic Streptococci Isolated     Performed at [facility]   Report Status 08/17/2013 FINAL   Final  CULTURE, ROUTINE-ABSCESS     Status: None   Collection Time    08/15/13  6:17 PM      Result Value Ref Range Status   Specimen Description ABSCESS GALL BLADDER   Final   Special Requests RUQ IN THE GB FOSSA,SPECIMEN NO 1   Final   Gram Stain     Final   Value: FEW WBC PRESENT,BOTH PMN AND MONONUCLEAR     NO SQUAMOUS EPITHELIAL CELLS SEEN     RARE GRAM POSITIVE COCCI IN PAIRS     RARE GRAM POSITIVE RODS     Performed at [facility]   Culture     Final   Value: MULTIPLE ORGANISMS PRESENT, NONE PREDOMINANT     Note: NO STAPHYLOCOCCUS AUREUS ISOLATED NO GROUP A STREP (S.PYOGENES) ISOLATED     Performed at [facility]   Report Status 08/19/2013 FINAL   Final  CULTURE, ROUTINE-ABSCESS     Status: None   Collection Time    08/15/13  6:52 PM      Result Value Ref Range Status   Specimen Description ABSCESS   Final   Special  Requests     Final   Value: MID EPIGASTRUM IN THE ANTERIOR PERI GASTRIC SAMPLE NO 2   Gram Stain     Final   Value: ABUNDANT WBC PRESENT, PREDOMINANTLY PMN     NO SQUAMOUS EPITHELIAL CELLS SEEN     ABUNDANT GRAM POSITIVE COCCI IN PAIRS     Performed at [facility]   Culture     Final   Value: ABUNDANT ENTEROCOCCUS SPECIES     Performed at [facility]   Report Status 08/19/2013 FINAL   Final   Organism ID, Bacteria ENTEROCOCCUS SPECIES   Final  CULTURE, BLOOD (ROUTINE X 2)     Status: None   Collection Time    08/16/13  1:50 PM      Result Value Ref Range Status   Specimen Description BLOOD LEFT ARM   Final   Special Requests BOTTLES DRAWN AEROBIC AND ANAEROBIC 10CC   Final   Culture  Setup Time     Final   Value: 08/16/2013 23:31     Performed at [facility]   Culture     Final   Value:        BLOOD CULTURE RECEIVED NO GROWTH TO DATE CULTURE WILL BE HELD FOR 5  DAYS BEFORE ISSUING A FINAL NEGATIVE REPORT     Performed at [facility]   Report Status PENDING   Incomplete  CULTURE, BLOOD (ROUTINE X 2)     Status: None   Collection Time    08/16/13  1:55 PM      Result Value Ref Range Status   Specimen Description BLOOD LEFT ARM   Final   Special Requests BOTTLES DRAWN AEROBIC AND ANAEROBIC 10CC   Final   Culture  Setup Time     Final   Value: 08/16/2013 23:31     Performed at [facility]   Culture     Final   Value:        BLOOD CULTURE RECEIVED NO GROWTH TO DATE CULTURE WILL BE HELD FOR 5 DAYS BEFORE ISSUING A FINAL NEGATIVE REPORT     Performed at [facility]   Report Status PENDING   Incomplete     Studies:  Recent x-ray studies have been reviewed in detail by the Attending Physician  Scheduled Meds:  Scheduled Meds: . amLODipine  10 mg Oral Daily  . ampicillin (OMNIPEN) IV  2 g Intravenous 4 times per day  . aspirin EC  81 mg Oral Daily  . docusate sodium  100 mg Oral BID  . enoxaparin (LOVENOX) injection  40 mg Subcutaneous Q24H  . feeding supplement (ENSURE COMPLETE)  237 mL Oral BID BM  . fluconazole  100 mg Oral Daily  . insulin aspart  0-15 Units Subcutaneous TID WC  . insulin aspart  0-5 Units Subcutaneous QHS  . insulin glargine  5 Units Subcutaneous QHS  . irbesartan  300 mg Oral QHS  . latanoprost  1 drop Both Eyes QHS  . multivitamin with minerals  1 tablet Oral Daily  . nystatin  5 mL Oral QID  . polyethylene glycol  17 g Oral TID  . predniSONE  5 mg Oral Q breakfast  . tamsulosin  0.4 mg Oral BID   Time spent on care of this patient: 25 min  Sheila Oats, MD [place]  [phone] Pager - Text Page per Shea Evans as per below:  On-Call/Text Page:      [email or web address]  password [email or web address]  If 7PM-7AM, please contact night-coverage [email or web address] Password [email or web address] 08/21/2013, 3:02 PM   LOS: 7 days

## 2013-08-21 NOTE — Progress Notes (Signed)
Gen. Surgery:   History: Patient remained stable. Denies pain. Denies nausea. Still reports painful swallowing, presumably from thrush. Remains afebrile. On antibiotics. Follow by internal medicine, infectious disease, interventional radiology, and now is being followed by inpatient rehabilitation service.  Exam: Overt. In no distress. Cooperative. Significantly deconditioned and feeble. Abdomen: Soft. Nontender. Benign. Was clean. Drainage serosanguineous, low volume.   Assessment/Plan: Abdominal and peri-hepatic abscess , Percutaneous drainage X 2 performed 08/15/2013.  Status post laparoscopic lysis of adhesions, laparoscopic cholecystectomy and cholangiogram (07-25-2013)  Clinically stable  Continue drainage and antibiotics  Reassess adequacy of drainage by CT when interventional radiology feels this is appropriate    History of cerebrovascular accident with right-sided weakness  History of polio  Insulin-dependent diabetes  Hypertension  Arthritis on chronic prednisone  Remote history of laparotomy for SBO  BPH  Recent enterococcus UTI  Chronic constipation and ileus  Significant acute on chronic deconditioning. Eventual return to CIR/SNF.([facility])  Edsel Petrin. Dalbert Batman, M.D., [facility] General and Minimally invasive Surgery Breast and Colorectal Surgery Office:   [phone]

## 2013-08-22 ENCOUNTER — Inpatient Hospital Stay (HOSPITAL_COMMUNITY): Payer: Medicare Other

## 2013-08-22 DIAGNOSIS — E119 Type 2 diabetes mellitus without complications: Secondary | ICD-10-CM

## 2013-08-22 LAB — CULTURE, BLOOD (ROUTINE X 2)
CULTURE: NO GROWTH
Culture: NO GROWTH

## 2013-08-22 LAB — PROTIME-INR
INR: 1.36 (ref 0.00–1.49)
Prothrombin Time: 16.4 seconds — ABNORMAL HIGH (ref 11.6–15.2)

## 2013-08-22 LAB — GLUCOSE, CAPILLARY
GLUCOSE-CAPILLARY: 193 mg/dL — AB (ref 70–99)
Glucose-Capillary: 139 mg/dL — ABNORMAL HIGH (ref 70–99)
Glucose-Capillary: 185 mg/dL — ABNORMAL HIGH (ref 70–99)
Glucose-Capillary: 260 mg/dL — ABNORMAL HIGH (ref 70–99)

## 2013-08-22 MED ORDER — IOHEXOL 300 MG/ML  SOLN
100.0000 mL | Freq: Once | INTRAMUSCULAR | Status: AC | PRN
  Administered 2013-08-22: 100 mL via INTRAVENOUS

## 2013-08-22 MED ORDER — SODIUM CHLORIDE 0.9 % IJ SOLN
10.0000 mL | INTRAMUSCULAR | Status: DC | PRN
  Administered 2013-08-24 – 2013-09-06 (×7): 10 mL

## 2013-08-22 MED ORDER — IOHEXOL 300 MG/ML  SOLN
25.0000 mL | INTRAMUSCULAR | Status: AC
  Administered 2013-08-22 (×2): 25 mL via ORAL

## 2013-08-22 NOTE — Progress Notes (Signed)
Progress Note    Zade Nordhoff [id number] DOB: [birth year] DOA: 08/14/2013 PCP: [name], MD  Brief narrative: 78 y.o. male who presented on 08/14/2013 with a hx of HTN, HLD, prior stroke, recent laparoscopic cholecystectomy 07/25/2013 by [name] (discharged to [facility] 07/30/2013) with several days duration of persistent epigastric abdominal pain associated with fevers, chills, and malaise.   In the ED CT abdomen was notable for abscess in the gallbladder bed, as well as peri-hepatic abscesses. Surgery was consulted and [facility] was asked to admit for further evaluation. Pt started on Vancomycin and Zosyn in ED.  Subjective: Patient denies any new complaints.  Assessment/Plan:  Severe sepsis /  Enterococcus + ?Morganella bacteremia / subhepatic abscess /  epigastric abscess  - s/p lap cholecystectomy 07/25/2013 - now with 2 perc drains being managed by IR - bacteremia with enterococcus and ?morganella - ID has decided to focus on the Enterococcus (gram stain results not suggestive of active Morganella infection) - IV ampicillin for 4-6 weeks and follow for resolution- pt is improving steadily on his current tx regimen - repeat cultures negative  - PICC line placed today, awaiting repeat CT today in ordered per IR  Diabetes mellitus with neuropathy - ok control at present, follow   Hypertension -Continue current meds, follow.  H/o Stroke w/ R sided weakness - med rec does not mention ASA or Statin, but old records reveal pt was on ASA 325 QD in Nov 2014 - add low dose ASA today - hold statin until tolerating full regular diet and lipids rechecked as outpt  Anemia - follow - will need transfusion prn to keep Hgb 7.0 or > - anemia panel consistent with AOCD, likely due to smoldering infection - will avoid IV Fe at present due to recent bacteremia   Chronic steroid use - no need for stress dose steroids currently - unclear why pt is on chronic steroids (?arthritits) - suggest very  slow wean to off as outpt if able   Thrush - cont diflucan - has sore throat as well which may be consistent with candida esophagitis - sx improving slowly   Dysphagia - likely due to above - follow with Diflucan   BPH - Flomax  Elevated INR - due to poor nutrition? - given 1 U FFP prior to procedure - f/u normal - recheck on Friday   Code Status: FULL Family Communication: Wife at bedside Disposition Plan: SNF when medically ready - needs f/u CT/drain checks   Consultants: Gen Surg  IR ID  Procedures: 2/27 - abdom abscess drain x2 in IR  2/28 - TTE - mild LVH - EF 55-60% - no WMA - grade 1 DD  Antibiotics: Ampicillin 3/02 >> Diflucan 3/01 >>  Zosyn 2/26 >> 3/02 Vanc 2/26 >> 3/01  DVT prophylaxis: Lovenox  Objective: Filed Weights   08/20/13 0524 08/21/13 0609 08/22/13 0500  Weight: 62 kg (136 lb 11 oz) 62.914 kg (138 lb 11.2 oz) 63.141 kg (139 lb 3.2 oz)   Blood pressure 132/64, pulse 89, temperature 98 F (36.7 C), temperature source Oral, resp. rate 16, height 5\' 5"  (1.651 m), weight 63.141 kg (139 lb 3.2 oz), SpO2 98.00%.  Intake/Output Summary (Last 24 hours) at 08/22/13 1556 Last data filed at 08/22/13 1022  Gross per 24 hour  Intake   1140 ml  Output   1545 ml  Net   -405 ml   Exam: General: No acute respiratory distress - more alert and engaging today  Lungs: Clear to auscultation  bilaterally without wheezes or crackles Cardiovascular: Regular rate and rhythm without murmur gallop or rub  Abdomen: 2 drains on right side intact w/ no d/c or erythema at insertion sites, nondistended, soft, bowel sounds positive, no rebound, no ascites, no appreciable mass Extremities: No significant cyanosis, clubbing, or edema bilateral lower extremities  Data Reviewed: Basic Metabolic Panel:  Recent Labs Lab 08/15/13 1836 08/18/13 0308 08/21/13 0557  NA 144 138 136*  K 4.2 3.8 3.3*  CL 107 106 99  CO2 26 23 25   GLUCOSE 218* 126* 91  BUN 31* 16 6    CREATININE 1.18 0.87 0.75  CALCIUM 8.1* 7.9* 8.0*   Liver Function Tests:  Recent Labs Lab 08/21/13 0557  AST 29  ALT 36  ALKPHOS 186*  BILITOT 0.2*  PROT 6.1  ALBUMIN 1.6*   CBC:  Recent Labs Lab 08/16/13 0245 08/17/13 0905 08/18/13 0308 08/19/13 0237 08/21/13 0557  WBC 11.8* 11.9* 10.3 8.9 8.5  HGB 7.3* 8.2* 7.8* 7.5* 7.1*  HCT 22.0* 25.1* 23.6* 22.8* 21.5*  MCV 80.6 80.7 79.7 78.9 78.8  PLT 180 223 223 249 325   Cardiac Enzymes: No results found for this basename: CKTOTAL, CKMB, CKMBINDEX, TROPONINI,  in the last 168 hours CBG:  Recent Labs Lab 08/21/13 1219 08/21/13 1652 08/21/13 2050 08/22/13 0805 08/22/13 1155  GLUCAP 144* 166* 154* 139* 185*    Recent Results (from the past 240 hour(s))  URINE CULTURE     Status: None   Collection Time    08/14/13 12:17 AM      Result Value Ref Range Status   Specimen Description URINE, CATHETERIZED   Final   Special Requests NONE   Final   Culture  Setup Time     Final   Value: 08/15/2013 03:29     Performed at [place]     Final   Value: NO GROWTH     Performed at [facility]   Culture     Final   Value: NO GROWTH     Performed at [facility]   Report Status 08/16/2013 FINAL   Final  CULTURE, BLOOD (ROUTINE X 2)     Status: None   Collection Time    08/14/13  2:38 PM      Result Value Ref Range Status   Specimen Description BLOOD ARM LEFT   Final   Special Requests BOTTLES DRAWN AEROBIC AND ANAEROBIC 5CC   Final   Culture  Setup Time     Final   Value: 08/14/2013 20:43     Performed at [facility]   Culture     Final   Value: ENTEROCOCCUS SPECIES     Note: COMBINATION THERAPY OF HIGH DOSE AMPICILLIN OR VANCOMYCIN, PLUS AN AMINOGLYCOSIDE, IS USUALLY INDICATED FOR SERIOUS ENTEROCOCCAL INFECTIONS.     MORGANELLA MORGANII     Note: Gram Stain Report Called to,Read Back By and Verified With: MORGAN SCHMITZ 08/15/12 0815 BY SMITHERSJ CRITICAL RESULT  CALLED TO, READ BACK BY AND VERIFIED WITH: ROSEMARY CLARK 2/29/15 @ 10:46AM BY RUSCOE A.     Performed at [facility]   Report Status 08/18/2013 FINAL   Final   Organism ID, Bacteria ENTEROCOCCUS SPECIES   Final   Organism ID, Bacteria MORGANELLA MORGANII   Final  CULTURE, BLOOD (ROUTINE X 2)     Status: None   Collection Time    08/14/13  2:52 PM      Result Value  Ref Range Status   Specimen Description BLOOD RIGHT FOREARM   Final   Special Requests BOTTLES DRAWN AEROBIC AND ANAEROBIC 5CC   Final   Culture  Setup Time     Final   Value: 08/14/2013 20:43     Performed at [facility]   Culture     Final   Value: ENTEROCOCCUS SPECIES     Note: SUSCEPTIBILITIES PERFORMED ON PREVIOUS CULTURE WITHIN THE LAST 5 DAYS.     Note: Gram Stain Report Called to,Read Back By and Verified With: Drema Dallas 08/15/13 0815 BY SMITHERSJ     Performed at [facility]   Report Status 08/17/2013 FINAL   Final  RAPID STREP SCREEN     Status: None   Collection Time    08/15/13 11:42 AM      Result Value Ref Range Status   Streptococcus, Group A Screen (Direct) NEGATIVE  NEGATIVE Final   Comment: (NOTE)     A Rapid Antigen test may result negative if the antigen level in the     sample is below the detection level of this test. The FDA has not     cleared this test as a stand-alone test therefore the rapid antigen     negative result has reflexed to a Group A Strep culture.  CULTURE, GROUP A STREP     Status: None   Collection Time    08/15/13 11:42 AM      Result Value Ref Range Status   Specimen Description THROAT   Final   Special Requests NONE   Final   Culture     Final   Value: No Beta Hemolytic Streptococci Isolated     Performed at [facility]   Report Status 08/17/2013 FINAL   Final  CULTURE, ROUTINE-ABSCESS     Status: None   Collection Time    08/15/13  6:17 PM      Result Value Ref Range Status   Specimen Description ABSCESS GALL BLADDER   Final    Special Requests RUQ IN THE GB FOSSA,SPECIMEN NO 1   Final   Gram Stain     Final   Value: FEW WBC PRESENT,BOTH PMN AND MONONUCLEAR     NO SQUAMOUS EPITHELIAL CELLS SEEN     RARE GRAM POSITIVE COCCI IN PAIRS     RARE GRAM POSITIVE RODS     Performed at [facility]   Culture     Final   Value: MULTIPLE ORGANISMS PRESENT, NONE PREDOMINANT     Note: NO STAPHYLOCOCCUS AUREUS ISOLATED NO GROUP A STREP (S.PYOGENES) ISOLATED     Performed at [facility]   Report Status 08/19/2013 FINAL   Final  CULTURE, ROUTINE-ABSCESS     Status: None   Collection Time    08/15/13  6:52 PM      Result Value Ref Range Status   Specimen Description ABSCESS   Final   Special Requests     Final   Value: MID EPIGASTRUM IN THE ANTERIOR PERI GASTRIC SAMPLE NO 2   Gram Stain     Final   Value: ABUNDANT WBC PRESENT, PREDOMINANTLY PMN     NO SQUAMOUS EPITHELIAL CELLS SEEN     ABUNDANT GRAM POSITIVE COCCI IN PAIRS     Performed at [facility]   Culture     Final   Value: ABUNDANT ENTEROCOCCUS SPECIES     Performed at [facility]   Report Status 08/19/2013 FINAL  Final   Organism ID, Bacteria ENTEROCOCCUS SPECIES   Final  CULTURE, BLOOD (ROUTINE X 2)     Status: None   Collection Time    08/16/13  1:50 PM      Result Value Ref Range Status   Specimen Description BLOOD LEFT ARM   Final   Special Requests BOTTLES DRAWN AEROBIC AND ANAEROBIC 10CC   Final   Culture  Setup Time     Final   Value: 08/16/2013 23:31     Performed at [facility]   Culture     Final   Value: NO GROWTH 5 DAYS     Performed at [facility]   Report Status 08/22/2013 FINAL   Final  CULTURE, BLOOD (ROUTINE X 2)     Status: None   Collection Time    08/16/13  1:55 PM      Result Value Ref Range Status   Specimen Description BLOOD LEFT ARM   Final   Special Requests BOTTLES DRAWN AEROBIC AND ANAEROBIC 10CC   Final   Culture  Setup Time     Final   Value: 08/16/2013 23:31      Performed at [facility]   Culture     Final   Value: NO GROWTH 5 DAYS     Performed at [facility]   Report Status 08/22/2013 FINAL   Final     Studies:  Recent x-ray studies have been reviewed in detail by the Attending Physician  Scheduled Meds:  Scheduled Meds: . amLODipine  10 mg Oral Daily  . ampicillin (OMNIPEN) IV  2 g Intravenous 4 times per day  . aspirin EC  81 mg Oral Daily  . dextromethorphan-guaiFENesin  1 tablet Oral BID  . docusate sodium  100 mg Oral BID  . enoxaparin (LOVENOX) injection  40 mg Subcutaneous Q24H  . feeding supplement (ENSURE COMPLETE)  237 mL Oral BID BM  . fluconazole  100 mg Oral Daily  . insulin aspart  0-15 Units Subcutaneous TID WC  . insulin aspart  0-5 Units Subcutaneous QHS  . insulin glargine  5 Units Subcutaneous QHS  . irbesartan  300 mg Oral QHS  . latanoprost  1 drop Both Eyes QHS  . multivitamin with minerals  1 tablet Oral Daily  . nystatin  5 mL Oral QID  . polyethylene glycol  17 g Oral TID  . predniSONE  5 mg Oral Q breakfast  . tamsulosin  0.4 mg Oral BID   Time spent on care of this patient: 25 min  Sheila Oats, MD [place]  [phone] Pager - Text Page per Shea Evans as per below:  On-Call/Text Page:      [email or web address]      password [email or web address]  If 7PM-7AM, please contact night-coverage [email or web address] Password [email or web address] 08/22/2013, 3:56 PM   LOS: 8 days

## 2013-08-22 NOTE — Progress Notes (Signed)
Physical Therapy Treatment Patient Details Name: Mike Mack MRN: [id number] DOB: [birth year] Today's Date: 08/22/2013 Time: 7322-0254 PT Time Calculation (min): 27 min  PT Assessment / Plan / Recommendation  History of Present Illness pt has had lap chole surgery 07/25/13. Pt returned from SNF with n/v/fever and abd pain. Pt found on ct to have biloma arising from surgical bed; posterior Rt hepatic lobe abscess; L hepatic lobe abscess extending beneath abd wall.    PT Comments   Patient agreeable to OOB to bed. Patient continues with generalized weakness. Continue to recommend SNF for ongoing Physical Therapy.     Follow Up Recommendations  SNF     Does the patient have the potential to tolerate intense rehabilitation     Barriers to Discharge        Equipment Recommendations       Recommendations for Other Services    Frequency Min 2X/week   Progress towards PT Goals Progress towards PT goals: Progressing toward goals  Plan Current plan remains appropriate    Precautions / Restrictions Precautions Precautions: Fall Precaution Comments: 2 JP drains in abd Restrictions Weight Bearing Restrictions: No   Pertinent Vitals/Pain no apparent distress    Mobility  Bed Mobility Overal bed mobility: Needs Assistance Bed Mobility: Supine to Sit;Sit to Supine Supine to sit: Total assist;+2 for physical assistance;HOB elevated Sit to supine: Total assist;+2 for physical assistance General bed mobility comments: A for trunk elevation and to bring hips to EOB. pt able to move LEs to EOB and use L UE to assist.  Transfers Overall transfer level: Needs assistance Equipment used: 2 person hand held assist Transfers: Sit to/from [facility] Sit to Stand: Max assist;+2 physical assistance Stand pivot transfers: Total assist;+2 physical assistance General transfer comment: pt unable to step with R LE and required maxA to advance LE. Patient having difficulting standing  fully upright. Attempted stand x3    Exercises     PT Diagnosis:    PT Problem List:   PT Treatment Interventions:     PT Goals (current goals can now be found in the care plan section)    Visit Information  Last PT Received On: 08/22/13 Assistance Needed: +2 History of Present Illness: pt has had lap chole surgery 07/25/13. Pt returned from SNF with n/v/fever and abd pain. Pt found on ct to have biloma arising from surgical bed; posterior Rt hepatic lobe abscess; L hepatic lobe abscess extending beneath abd wall.     Subjective Data      Cognition  Cognition Arousal/Alertness: Awake/alert Behavior During Therapy: WFL for tasks assessed/performed Overall Cognitive Status: No family/caregiver present to determine baseline cognitive functioning    Balance  Balance Sitting balance-Leahy Scale: Poor Standing balance-Leahy Scale: Zero  End of Session PT - End of Session Equipment Utilized During Treatment: Gait belt Activity Tolerance: Patient tolerated treatment well;Patient limited by fatigue Patient left: in chair;with call bell/phone within reach Nurse Communication: Mobility status   GP     Jacqualyn Posey 08/22/2013, 11:54 AM 08/22/2013 Jacqualyn Posey PTA [phone] pager [phone] office

## 2013-08-22 NOTE — Progress Notes (Signed)
Occupational Therapy Treatment Patient Details Name: Mike Mack MRN: [id number] DOB: [birth year] Today's Date: 08/22/2013 Time: 4098-1191 OT Time Calculation (min): 54 min  OT Assessment / Plan / Recommendation  History of present illness pt has had lap chole surgery 07/25/13. Pt returned from SNF with n/v/fever and abd pain. Pt found on ct to have biloma arising from surgical bed; posterior Rt hepatic lobe abscess; L hepatic lobe abscess extending beneath abd wall.    OT comments  Pt. Ed. On use of AE and pt. Was able to return demo with extra time required. Pt. Is very slow with movement which may be due to abdominal discomfort.   Follow Up Recommendations       Barriers to Discharge       Equipment Recommendations       Recommendations for Other Services    Frequency     Progress towards OT Goals Progress towards OT goals: Progressing toward goals  Plan      Precautions / Restrictions Precautions Precautions: Fall Precaution Comments: 2 JP drains in abd Restrictions Weight Bearing Restrictions: No   Pertinent Vitals/Pain Pt. States uncomfortable when attempting to bed down.    ADL  Grooming: Wash/dry hands;Wash/dry face;Brushing hair;Set up Where Assessed - Grooming: Unsupported sitting Upper Body Bathing: Minimal assistance Where Assessed - Upper Body Bathing: Unsupported sitting Upper Body Dressing: Performed;Minimal assistance Where Assessed - Upper Body Dressing: Unsupported sitting Lower Body Dressing: Performed (Min A to don/doff socks sitting in chair with AE) Where Assessed - Lower Body Dressing: Unsupported sitting ADL Comments:  (Pt. requires extra time to perform task. )    OT Diagnosis:    OT Problem List:   OT Treatment Interventions:     OT Goals(current goals can now be found in the care plan section) Acute Rehab OT Goals Patient Stated Goal: go to rehab prior to d/c home.  Visit Information  Last OT Received On: 08/22/13 Assistance Needed:  +2 History of Present Illness: pt has had lap chole surgery 07/25/13. Pt returned from SNF with n/v/fever and abd pain. Pt found on ct to have biloma arising from surgical bed; posterior Rt hepatic lobe abscess; L hepatic lobe abscess extending beneath abd wall.     Subjective Data      Prior Functioning       Cognition  Cognition Arousal/Alertness: Awake/alert Behavior During Therapy: WFL for tasks assessed/performed Overall Cognitive Status: No family/caregiver present to determine baseline cognitive functioning    Mobility  Bed Mobility Overal bed mobility: Needs Assistance Bed Mobility: Supine to Sit;Sit to Supine Supine to sit: Total assist;+2 for physical assistance;HOB elevated Sit to supine: Total assist;+2 for physical assistance General bed mobility comments: A for trunk elevation and to bring hips to EOB. pt able to move LEs to EOB and use L UE to assist.  Transfers Overall transfer level: Needs assistance Equipment used: 2 person hand held assist Transfers: Sit to/from [facility] Sit to Stand: Max assist;+2 physical assistance Stand pivot transfers: Total assist;+2 physical assistance General transfer comment: pt unable to step with R LE and required maxA to advance LE. Patient having difficulting standing fully upright. Attempted stand x3    Exercises      Balance Balance Sitting balance-Leahy Scale: Poor Standing balance-Leahy Scale: Zero  End of Session OT - End of Session Activity Tolerance: Patient tolerated treatment well Patient left: in chair;with call bell/phone within reach  [place] 08/22/2013, 1:07 PM

## 2013-08-22 NOTE — Progress Notes (Signed)
Patient ID: Mike Mack, male   DOB: 17-Dec-1935, 78 y.o.   MRN: [id number] During the patient's CT scan, approximately 80-90 ml of contrast extravasated into his right upper arm, biceps region. Upon evaluation, the patient was without complaint. Brachial and radial pulses were strong and normal. Motor and sensory function in the right upper arm were normal and symmetric to the left.  Orders were placed in the patient's chart. The Radiology PA will follow up with patient tomorrow.

## 2013-08-22 NOTE — Progress Notes (Addendum)
[place] for Infectious Disease  Date of Admission:  08/14/2013  Antibiotics: Vancomycin 5 days Zosyn 5 days Ampicillin day 5  Subjective: No complaints  Objective: Temp:  [97.7 F (36.5 C)-98.7 F (37.1 C)] 97.7 F (36.5 C) (03/06 1022) Pulse Rate:  [86-99] 99 (03/06 1022) Resp:  [16-18] 17 (03/06 1022) BP: (126-140)/(61-70) 140/70 mmHg (03/06 1022) SpO2:  [96 %-100 %] 100 % (03/06 1022) Weight:  [139 lb 3.2 oz (63.141 kg)] 139 lb 3.2 oz (63.141 kg) (03/06 0500)  General: awake, alert, up in chair Skin: no rashes Lungs: CTA B Cor: RRR without m Abdomen: Soft, NT, ND, covered incisions.  Ext: no edema  Lab Results Lab Results  Component Value Date   WBC 8.5 08/21/2013   HGB 7.1* 08/21/2013   HCT 21.5* 08/21/2013   MCV 78.8 08/21/2013   PLT 325 08/21/2013    Lab Results  Component Value Date   CREATININE 0.75 08/21/2013   BUN 6 08/21/2013   NA 136* 08/21/2013   K 3.3* 08/21/2013   CL 99 08/21/2013   CO2 25 08/21/2013    Lab Results  Component Value Date   ALT 36 08/21/2013   AST 29 08/21/2013   ALKPHOS 186* 08/21/2013   BILITOT 0.2* 08/21/2013      Microbiology: Recent Results (from the past 240 hour(s))  URINE CULTURE     Status: None   Collection Time    08/14/13 12:17 AM      Result Value Ref Range Status   Specimen Description URINE, CATHETERIZED   Final   Special Requests NONE   Final   Culture  Setup Time     Final   Value: 08/15/2013 03:29     Performed at [place]     Final   Value: NO GROWTH     Performed at [facility]   Culture     Final   Value: NO GROWTH     Performed at [facility]   Report Status 08/16/2013 FINAL   Final  CULTURE, BLOOD (ROUTINE X 2)     Status: None   Collection Time    08/14/13  2:38 PM      Result Value Ref Range Status   Specimen Description BLOOD ARM LEFT   Final   Special Requests BOTTLES DRAWN AEROBIC AND ANAEROBIC 5CC   Final   Culture  Setup Time     Final   Value:  08/14/2013 20:43     Performed at [facility]   Culture     Final   Value: ENTEROCOCCUS SPECIES     Note: COMBINATION THERAPY OF HIGH DOSE AMPICILLIN OR VANCOMYCIN, PLUS AN AMINOGLYCOSIDE, IS USUALLY INDICATED FOR SERIOUS ENTEROCOCCAL INFECTIONS.     MORGANELLA MORGANII     Note: Gram Stain Report Called to,Read Back By and Verified With: MORGAN SCHMITZ 08/15/12 0815 BY SMITHERSJ CRITICAL RESULT CALLED TO, READ BACK BY AND VERIFIED WITH: ROSEMARY CLARK 2/29/15 @ 10:46AM BY RUSCOE A.     Performed at [facility]   Report Status 08/18/2013 FINAL   Final   Organism ID, Bacteria ENTEROCOCCUS SPECIES   Final   Organism ID, Bacteria MORGANELLA MORGANII   Final  CULTURE, BLOOD (ROUTINE X 2)     Status: None   Collection Time    08/14/13  2:52 PM      Result Value Ref Range Status   Specimen Description BLOOD RIGHT FOREARM   Final  Special Requests BOTTLES DRAWN AEROBIC AND ANAEROBIC 5CC   Final   Culture  Setup Time     Final   Value: 08/14/2013 20:43     Performed at [facility]   Culture     Final   Value: ENTEROCOCCUS SPECIES     Note: SUSCEPTIBILITIES PERFORMED ON PREVIOUS CULTURE WITHIN THE LAST 5 DAYS.     Note: Gram Stain Report Called to,Read Back By and Verified With: Drema Dallas 08/15/13 0815 BY SMITHERSJ     Performed at [facility]   Report Status 08/17/2013 FINAL   Final  RAPID STREP SCREEN     Status: None   Collection Time    08/15/13 11:42 AM      Result Value Ref Range Status   Streptococcus, Group A Screen (Direct) NEGATIVE  NEGATIVE Final   Comment: (NOTE)     A Rapid Antigen test may result negative if the antigen level in the     sample is below the detection level of this test. The FDA has not     cleared this test as a stand-alone test therefore the rapid antigen     negative result has reflexed to a Group A Strep culture.  CULTURE, GROUP A STREP     Status: None   Collection Time    08/15/13 11:42 AM      Result Value Ref  Range Status   Specimen Description THROAT   Final   Special Requests NONE   Final   Culture     Final   Value: No Beta Hemolytic Streptococci Isolated     Performed at [facility]   Report Status 08/17/2013 FINAL   Final  CULTURE, ROUTINE-ABSCESS     Status: None   Collection Time    08/15/13  6:17 PM      Result Value Ref Range Status   Specimen Description ABSCESS GALL BLADDER   Final   Special Requests RUQ IN THE GB FOSSA,SPECIMEN NO 1   Final   Gram Stain     Final   Value: FEW WBC PRESENT,BOTH PMN AND MONONUCLEAR     NO SQUAMOUS EPITHELIAL CELLS SEEN     RARE GRAM POSITIVE COCCI IN PAIRS     RARE GRAM POSITIVE RODS     Performed at [facility]   Culture     Final   Value: MULTIPLE ORGANISMS PRESENT, NONE PREDOMINANT     Note: NO STAPHYLOCOCCUS AUREUS ISOLATED NO GROUP A STREP (S.PYOGENES) ISOLATED     Performed at [facility]   Report Status 08/19/2013 FINAL   Final  CULTURE, ROUTINE-ABSCESS     Status: None   Collection Time    08/15/13  6:52 PM      Result Value Ref Range Status   Specimen Description ABSCESS   Final   Special Requests     Final   Value: MID EPIGASTRUM IN THE ANTERIOR PERI GASTRIC SAMPLE NO 2   Gram Stain     Final   Value: ABUNDANT WBC PRESENT, PREDOMINANTLY PMN     NO SQUAMOUS EPITHELIAL CELLS SEEN     ABUNDANT GRAM POSITIVE COCCI IN PAIRS     Performed at [facility]   Culture     Final   Value: ABUNDANT ENTEROCOCCUS SPECIES     Performed at [facility]   Report Status 08/19/2013 FINAL   Final   Organism ID, Bacteria ENTEROCOCCUS SPECIES   Final  CULTURE, BLOOD (  ROUTINE X 2)     Status: None   Collection Time    08/16/13  1:50 PM      Result Value Ref Range Status   Specimen Description BLOOD LEFT ARM   Final   Special Requests BOTTLES DRAWN AEROBIC AND ANAEROBIC 10CC   Final   Culture  Setup Time     Final   Value: 08/16/2013 23:31     Performed at [facility]   Culture     Final    Value: NO GROWTH 5 DAYS     Performed at [facility]   Report Status 08/22/2013 FINAL   Final  CULTURE, BLOOD (ROUTINE X 2)     Status: None   Collection Time    08/16/13  1:55 PM      Result Value Ref Range Status   Specimen Description BLOOD LEFT ARM   Final   Special Requests BOTTLES DRAWN AEROBIC AND ANAEROBIC 10CC   Final   Culture  Setup Time     Final   Value: 08/16/2013 23:31     Performed at [facility]   Culture     Final   Value: NO GROWTH 5 DAYS     Performed at [facility]   Report Status 08/22/2013 FINAL   Final    Studies/Results: No results found.  Assessment/Plan: 1) abdominal abscess - Enterococcus in blood cutlures, abscess cultures, drained by IR 2/27, purulent.   Morganella also noted on one blood culture but gram stain noted Gram positive.  Enterococcus is ampicillin sensitive and will target the Enterococcus.  TTE noted and no vegetation.  Will need a prolonged course of IV ampicillin of 4-6 weeks and follow for resolution.   CT noted and abscess is nearly resolved.  An enlarging low-density area noted in right hepatic lobe noted and is concerning for liver abscess.    Dr. Megan Salon is available over the weekend if needed, otherwise will follow up on Monday.    Scharlene Gloss, [place] [facility] [place] [email or web address] [phone] pager   [phone] cell 08/22/2013, 1:04 PM

## 2013-08-22 NOTE — Progress Notes (Signed)
Peripherally Inserted Central Catheter/Midline Placement  The IV Nurse has discussed with the patient and/or persons authorized to consent for the patient, the purpose of this procedure and the potential benefits and risks involved with this procedure.  The benefits include less needle sticks, lab draws from the catheter and patient may be discharged home with the catheter.  Risks include, but not limited to, infection, bleeding, blood clot (thrombus formation), and puncture of an artery; nerve damage and irregular heat beat.  Alternatives to this procedure were also discussed.  PICC/Midline Placement Documentation  PICC / Midline Single Lumen 96/28/36 Left Basilic 43 cm 1 cm (Active)  Indication for Insertion or Continuance of Line Poor Vasculature-patient has had multiple peripheral attempts or PIVs lasting less than 24 hours 08/22/2013  3:00 PM  Exposed Catheter (cm) 1 cm 08/22/2013  3:00 PM  Dressing Change Due 08/29/13 08/22/2013  3:00 PM       Jule Economy Horton 08/22/2013, 3:54 PM

## 2013-08-22 NOTE — Progress Notes (Signed)
Subjective: Pt doing ok; still has some upper abd pain  Objective: Vital signs in last 24 hours: Temp:  [97.7 F (36.5 C)-98.6 F (37 C)] 98 F (36.7 C) (03/06 1342) Pulse Rate:  [86-99] 89 (03/06 1342) Resp:  [16-17] 16 (03/06 1342) BP: (126-140)/(61-70) 132/64 mmHg (03/06 1342) SpO2:  [96 %-100 %] 98 % (03/06 1342) Weight:  [139 lb 3.2 oz (63.141 kg)] 139 lb 3.2 oz (63.141 kg) (03/06 0500) Last BM Date: 08/20/13  Intake/Output from previous day: 03/05 0701 - 03/06 0700 In: 1140 [P.O.:720; IV Piggyback:400] Out: 2080 [Urine:2050; Drains:30] Intake/Output this shift: Total I/O In: 360 [P.O.:360] Out: 565 [Urine:550; Drains:15]  RUQ/epigastric drains intact, outputs minimal; cx's - enterococcus/mult org; insertion sites ok, mildly tender to palpation  Lab Results:   Recent Labs  08/21/13 0557  WBC 8.5  HGB 7.1*  HCT 21.5*  PLT 325   BMET  Recent Labs  08/21/13 0557  NA 136*  K 3.3*  CL 99  CO2 25  GLUCOSE 91  BUN 6  CREATININE 0.75  CALCIUM 8.0*   PT/INR  Recent Labs  08/22/13 0705  LABPROT 16.4*  INR 1.36   ABG No results found for this basename: PHART, PCO2, PO2, HCO3,  in the last 72 hours  Studies/Results: Ct Abdomen Pelvis W Contrast  08/22/2013   CLINICAL DATA:  Prior percutaneous abscess drainage of right upper quadrant biloma/abscess. Now for follow-up.  EXAM: CT ABDOMEN AND PELVIS WITH CONTRAST  TECHNIQUE: Multidetector CT imaging of the abdomen and pelvis was performed using the standard protocol following bolus administration of intravenous contrast.  CONTRAST:  118mL OMNIPAQUE IOHEXOL 300 MG/ML SOLN. There was extravasation of contrast during injection. Approximately 80-90 cc of contrast was extravasated into the patient's right upper arm. Upon evaluation, the patient was without complaint. Patient has pulses in the right upper extremity were strong and normal. Pulses, sensory and motor function were normal and symmetric to the opposite  side. The patient will be followed by the radiology PA. Orders were placed in the patient's chart.  COMPARISON:  CT ABD/PELVIS W CM dated 08/14/2013  FINDINGS: Stable moderate to large right pleural effusion. New small left pleural effusion. Compressive atelectasis in the lower lobes bilaterally.  Patient is status post placement of 2 upper abdominal abscess drainage catheters, 1 in the postsurgical bed adjacent to the liver and the second in the midline anteriorly within the upper abdomen. The fluid collections in these areas have nearly completely resolved. A small residual fluid and gas collection anterior to the liver measures approximately 4.4 x 0.9 cm. This is smaller than prior study and presumably communicates with the previously seen large fluid collection in the gallbladder fossa. No measurable fluid collection noted adjacent to the midline drainage catheter in the subxiphoid region.  There is a complex appearing low-density area within the right hepatic lobe posterior to the gallbladder fossa. This currently measures up to 6 cm. This previously measured up to 3.1 cm. With the contrast extravasation that occurred during the study, this is essentially a noncontrast study making it difficult to evaluate this lesion, but this has enlarged since prior study and appears complex. Cannot exclude hepatic abscess.  Appendix is visualized and is normal. Stomach, large and small bowel grossly unremarkable. Prostate is enlarged. Foley catheter is in place within the bladder which is otherwise grossly unremarkable.  Spleen, pancreas, adrenals have an unremarkable unenhanced appearance. Small low-density lesions in the kidneys bilaterally were shown on prior contrast study to represent cysts.  No hydronephrosis. No free fluid or adenopathy.  No acute bony abnormality.  IMPRESSION: Near complete resolution of the large fluid collection in the gallbladder fossa with drainage catheter in place. Only a small amount of fluid  and gas remain present anterior to the liver edge.  No measurable fluid remains in the subxiphoid region adjacent to the midline drainage catheter.  Enlarging low-density area in the right hepatic lobe. Cannot exclude enlarging hepatic abscess.  Moderate to large right pleural effusion and small left pleural effusion. Compressive atelectasis in the lower lobes.   Electronically Signed   By: Rolm Baptise M.D.   On: 08/22/2013 15:32    Anti-infectives: Anti-infectives   Start     Dose/Rate Route Frequency Ordered Stop   08/18/13 1645  fluconazole (DIFLUCAN) tablet 100 mg     100 mg Oral Daily 08/18/13 1641     08/18/13 1200  ampicillin (OMNIPEN) 2 g in sodium chloride 0.9 % 50 mL IVPB    Comments:  Pharmacy may adjust as needed   2 g 150 mL/hr over 20 Minutes Intravenous 4 times per day 08/18/13 1049     08/17/13 1000  fluconazole (DIFLUCAN) IVPB 100 mg  Status:  Discontinued     100 mg 50 mL/hr over 60 Minutes Intravenous Every 24 hours 08/17/13 0936 08/18/13 1641   08/16/13 1100  vancomycin (VANCOCIN) 500 mg in sodium chloride 0.9 % 100 mL IVPB  Status:  Discontinued     500 mg 100 mL/hr over 60 Minutes Intravenous Every 12 hours 08/16/13 1016 08/18/13 1049   08/15/13 1200  vancomycin (VANCOCIN) 1,250 mg in sodium chloride 0.9 % 250 mL IVPB  Status:  Discontinued     1,250 mg 166.7 mL/hr over 90 Minutes Intravenous Every 24 hours 08/14/13 1911 08/16/13 1016   08/15/13 0000  piperacillin-tazobactam (ZOSYN) IVPB 3.375 g  Status:  Discontinued     3.375 g 12.5 mL/hr over 240 Minutes Intravenous Every 8 hours 08/14/13 1911 08/18/13 1049   08/14/13 1530  vancomycin (VANCOCIN) IVPB 1000 mg/200 mL premix     1,000 mg 200 mL/hr over 60 Minutes Intravenous  Once 08/14/13 1526 08/14/13 1823   08/14/13 1530  piperacillin-tazobactam (ZOSYN) IVPB 3.375 g     3.375 g 100 mL/hr over 30 Minutes Intravenous  Once 08/14/13 1526 08/14/13 1603      Assessment/Plan: s/p GB fossa/ epigastric abscess  drainages  2/27; f/u CT findings as above; case reviewed by Dr. Kathlene Cote- rec removal of both abd drains on 3/7 if no sig output; hold on placing drain into rt lobe density/?abscess for now  LOS: 8 days    Kinzleigh Kandler,D [facility] 08/22/2013

## 2013-08-22 NOTE — Progress Notes (Addendum)
Subjective: Remains alert. Stable. Afebrile. Says his swallowing is getting a little better. Being treated for thrush.  Total drainage from the 2 abdominal drains used 30 cc per 24 hours. Radiology has recommended proceeding with CT scan now.  Objective: Vital signs in last 24 hours: Temp:  [98.4 F (36.9 C)-98.7 F (37.1 C)] 98.5 F (36.9 C) (03/06 0516) Pulse Rate:  [86-96] 88 (03/06 0516) Resp:  [16-18] 17 (03/06 0112) BP: (126-155)/(61-69) 126/61 mmHg (03/06 0516) SpO2:  [93 %-99 %] 97 % (03/06 0516) Weight:  [139 lb 3.2 oz (63.141 kg)] 139 lb 3.2 oz (63.141 kg) (03/06 0500) Last BM Date: 08/20/13  Intake/Output from previous day: 03/05 0701 - 03/06 0700 In: 1140 [P.O.:720; IV Piggyback:400] Out: 2080 [Urine:2050; Drains:30] Intake/Output this shift: Total I/O In: 780 [P.O.:360; Other:20; IV QMVHQIONG:295] Out: 980 [Urine:950; Drains:30]    General appearance: alert. Oriented. Cooperative. Deconditioned and feeble. No distress. GI: abdomen is soft. Nontender. Nondistended. Benign. Drainage is serosanguineous, low-volume.  Lab Results:   Recent Labs  08/21/13 0557  WBC 8.5  HGB 7.1*  HCT 21.5*  PLT 325   BMET  Recent Labs  08/21/13 0557  NA 136*  K 3.3*  CL 99  CO2 25  GLUCOSE 91  BUN 6  CREATININE 0.75  CALCIUM 8.0*   PT/INR No results found for this basename: LABPROT, INR,  in the last 72 hours ABG No results found for this basename: PHART, PCO2, PO2, HCO3,  in the last 72 hours  Studies/Results: No results found.  Anti-infectives: Anti-infectives   Start     Dose/Rate Route Frequency Ordered Stop   08/18/13 1645  fluconazole (DIFLUCAN) tablet 100 mg     100 mg Oral Daily 08/18/13 1641     08/18/13 1200  ampicillin (OMNIPEN) 2 g in sodium chloride 0.9 % 50 mL IVPB    Comments:  Pharmacy may adjust as needed   2 g 150 mL/hr over 20 Minutes Intravenous 4 times per day 08/18/13 1049     08/17/13 1000  fluconazole (DIFLUCAN) IVPB 100 mg   Status:  Discontinued     100 mg 50 mL/hr over 60 Minutes Intravenous Every 24 hours 08/17/13 0936 08/18/13 1641   08/16/13 1100  vancomycin (VANCOCIN) 500 mg in sodium chloride 0.9 % 100 mL IVPB  Status:  Discontinued     500 mg 100 mL/hr over 60 Minutes Intravenous Every 12 hours 08/16/13 1016 08/18/13 1049   08/15/13 1200  vancomycin (VANCOCIN) 1,250 mg in sodium chloride 0.9 % 250 mL IVPB  Status:  Discontinued     1,250 mg 166.7 mL/hr over 90 Minutes Intravenous Every 24 hours 08/14/13 1911 08/16/13 1016   08/15/13 0000  piperacillin-tazobactam (ZOSYN) IVPB 3.375 g  Status:  Discontinued     3.375 g 12.5 mL/hr over 240 Minutes Intravenous Every 8 hours 08/14/13 1911 08/18/13 1049   08/14/13 1530  vancomycin (VANCOCIN) IVPB 1000 mg/200 mL premix     1,000 mg 200 mL/hr over 60 Minutes Intravenous  Once 08/14/13 1526 08/14/13 1823   08/14/13 1530  piperacillin-tazobactam (ZOSYN) IVPB 3.375 g     3.375 g 100 mL/hr over 30 Minutes Intravenous  Once 08/14/13 1526 08/14/13 1603      Assessment/Plan:   Abdominal and peri-hepatic abscess , Percutaneous drainage X 2 performed 08/15/2013.  IR managing. Status post laparoscopic lysis of adhesions, laparoscopic cholecystectomy and cholangiogram (07-25-2013)  Clinically stable  Continue drainage and antibiotics (amp and diflucan) CTA today as requested by IR.  Thrush, posible candida esophagitis,   on mycostatin/diflucan History of cerebrovascular accident with right-sided weakness  History of polio  Insulin-dependent diabetes  Hypertension  Arthritis on chronic prednisone  Remote history of laparotomy for SBO  BPH  Recent enterococcus UTI  Chronic constipation and ileus  Significant acute on chronic deconditioning. Eventual return to CIR or SNF.([facility])    LOS: 8 days    Cheyeanne Roadcap M 08/22/2013

## 2013-08-22 NOTE — Clinical Social Work Note (Signed)
CSW continuing to follow. Per 3S CSW handoff, pt is to return to [facility] at time of discharge. Per MD, pt is not ready for discharge at this time.  Pati Gallo, [place] Social Worker [phone]

## 2013-08-23 DIAGNOSIS — R5383 Other fatigue: Secondary | ICD-10-CM

## 2013-08-23 DIAGNOSIS — R5381 Other malaise: Secondary | ICD-10-CM

## 2013-08-23 LAB — GLUCOSE, CAPILLARY
Glucose-Capillary: 158 mg/dL — ABNORMAL HIGH (ref 70–99)
Glucose-Capillary: 150 mg/dL — ABNORMAL HIGH (ref 70–99)
Glucose-Capillary: 212 mg/dL — ABNORMAL HIGH (ref 70–99)
Glucose-Capillary: 167 mg/dL — ABNORMAL HIGH (ref 70–99)

## 2013-08-23 NOTE — Progress Notes (Signed)
Subjective: Patient denies any pain, fever or chills. Family is concerned for lack of appetite.   Objective: Physical Exam: BP 130/61  Pulse 87  Temp(Src) 98 F (36.7 C) (Oral)  Resp 17  Ht 5\' 5"  (1.651 m)  Wt 134 lb 3.2 oz (60.873 kg)  BMI 22.33 kg/m2  SpO2 98%  Abd: Soft, NT, ND, (+) BS. RUQ drain dressing C/D/I, output 10cc Midline drain intact 5cc output cx's - enterococcus/mult org  Labs: CBC  Recent Labs  08/21/13 0557  WBC 8.5  HGB 7.1*  HCT 21.5*  PLT 325   BMET  Recent Labs  08/21/13 0557  NA 136*  K 3.3*  CL 99  CO2 25  GLUCOSE 91  BUN 6  CREATININE 0.75  CALCIUM 8.0*   LFT  Recent Labs  08/21/13 0557  PROT 6.1  ALBUMIN 1.6*  AST 29  ALT 36  ALKPHOS 186*  BILITOT 0.2*   PT/INR  Recent Labs  08/22/13 0705  LABPROT 16.4*  INR 1.36     Studies/Results: Ct Abdomen Pelvis W Contrast  08/22/2013   CLINICAL DATA:  Prior percutaneous abscess drainage of right upper quadrant biloma/abscess. Now for follow-up.  EXAM: CT ABDOMEN AND PELVIS WITH CONTRAST  TECHNIQUE: Multidetector CT imaging of the abdomen and pelvis was performed using the standard protocol following bolus administration of intravenous contrast.  CONTRAST:  126mL OMNIPAQUE IOHEXOL 300 MG/ML SOLN. There was extravasation of contrast during injection. Approximately 80-90 cc of contrast was extravasated into the patient's right upper arm. Upon evaluation, the patient was without complaint. Patient has pulses in the right upper extremity were strong and normal. Pulses, sensory and motor function were normal and symmetric to the opposite side. The patient will be followed by the radiology PA. Orders were placed in the patient's chart.  COMPARISON:  CT ABD/PELVIS W CM dated 08/14/2013  FINDINGS: Stable moderate to large right pleural effusion. New small left pleural effusion. Compressive atelectasis in the lower lobes bilaterally.  Patient is status post placement of 2 upper abdominal  abscess drainage catheters, 1 in the postsurgical bed adjacent to the liver and the second in the midline anteriorly within the upper abdomen. The fluid collections in these areas have nearly completely resolved. A small residual fluid and gas collection anterior to the liver measures approximately 4.4 x 0.9 cm. This is smaller than prior study and presumably communicates with the previously seen large fluid collection in the gallbladder fossa. No measurable fluid collection noted adjacent to the midline drainage catheter in the subxiphoid region.  There is a complex appearing low-density area within the right hepatic lobe posterior to the gallbladder fossa. This currently measures up to 6 cm. This previously measured up to 3.1 cm. With the contrast extravasation that occurred during the study, this is essentially a noncontrast study making it difficult to evaluate this lesion, but this has enlarged since prior study and appears complex. Cannot exclude hepatic abscess.  Appendix is visualized and is normal. Stomach, large and small bowel grossly unremarkable. Prostate is enlarged. Foley catheter is in place within the bladder which is otherwise grossly unremarkable.  Spleen, pancreas, adrenals have an unremarkable unenhanced appearance. Small low-density lesions in the kidneys bilaterally were shown on prior contrast study to represent cysts. No hydronephrosis. No free fluid or adenopathy.  No acute bony abnormality.  IMPRESSION: Near complete resolution of the large fluid collection in the gallbladder fossa with drainage catheter in place. Only a small amount of fluid and gas remain present  anterior to the liver edge.  No measurable fluid remains in the subxiphoid region adjacent to the midline drainage catheter.  Enlarging low-density area in the right hepatic lobe. Cannot exclude enlarging hepatic abscess.  Moderate to large right pleural effusion and small left pleural effusion. Compressive atelectasis in the  lower lobes.   Electronically Signed   By: Rolm Baptise M.D.   On: 08/22/2013 15:32    Assessment/Plan: S/p GB fossa/ epigastric abscess drains placed 2/27, CT 3/6 with improved fluid collections.  New right inferior hepatic area of concern, recommend repeat imaging in 1 week, sooner if clinically indicated.  Minimal drain output, will remove midline abdominal drain today and if continues decreased output from RUQ drain will remove in 48 hours per Dr. Bosie Clos. Kathlene Cote   LOS: 9 days    Tsosie Billing D PA-C 08/23/2013 12:00 PM

## 2013-08-23 NOTE — Progress Notes (Signed)
Progress Note    Mike Mack RUE:[id number] DOB: [birth year] DOA: 08/14/2013 PCP: [name], MD  Brief narrative: 78 y.o. male who presented on 08/14/2013 with a hx of HTN, HLD, prior stroke, recent laparoscopic cholecystectomy 07/25/2013 by [name] (discharged to [facility] 07/30/2013) with several days duration of persistent epigastric abdominal pain associated with fevers, chills, and malaise.   In the ED CT abdomen was notable for abscess in the gallbladder bed, as well as peri-hepatic abscesses. Surgery was consulted and [facility] was asked to admit for further evaluation. Pt started on Vancomycin and Zosyn in ED.  Subjective: Patient denies any new complaints. Eating but small amounts  Assessment/Plan:  Severe sepsis /  Enterococcus + ?Morganella bacteremia / subhepatic abscess /  epigastric abscess  - s/p lap cholecystectomy 07/25/2013 - now with 2 perc drains being managed by IR - bacteremia with enterococcus and ?morganella - ID has decided to focus on the Enterococcus (gram stain results not suggestive of active Morganella infection) - IV ampicillin for 4-6 weeks and follow for resolution- pt is improving steadily on his current tx regimen - repeat cultures negative  -PICC line placed 3/6 -Repeat CT scan of abdomen and pelvis 3/6 with near-complete resolution of the large fluid collection in the gallbladder fossa with drainage catheter in place. Only a small amount of fluid and gas remain present anterior edge of the liver. An enlarging low density area in the right hepatic lobe cannot exclude enlarging hepatic abscess>> for now no new drain placement recommended per IR -Appreciate surgery assistance drains to be removed-one today and another in 48 hours as per surgery note  Diabetes mellitus with neuropathy - ok control at present, follow   Hypertension -Continue current meds, follow.  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 - given 1 U FFP prior to procedure - f/u normal - recheck on Friday   Code Status: FULL Family Communication: Wife at bedside Disposition Plan: SNF when medically ready - needs f/u CT/drain checks   Consultants: Gen Surg  IR ID  Procedures: 2/27 - abdom abscess drain x2 in IR  2/28 - TTE - mild LVH - EF 55-60% - no WMA - grade 1 DD  Antibiotics: Ampicillin 3/02 >> Diflucan 3/01 >>  Zosyn 2/26 >> 3/02 Vanc 2/26 >> 3/01  DVT prophylaxis: Lovenox  Objective: Filed Weights   08/21/13 0609 08/22/13 0500 08/23/13 0500  Weight: 62.914 kg (138 lb 11.2 oz) 63.141 kg (139 lb 3.2 oz) 60.873 kg (134 lb 3.2 oz)   Blood pressure 134/60, pulse 88, temperature 98.4 F (36.9 C), temperature  source Oral, resp. rate 18, height 5\' 5"  (1.651 m), weight 60.873 kg (134 lb 3.2 oz), SpO2 98.00%.  Intake/Output Summary (Last 24 hours) at 08/23/13 1530 Last data filed at 08/23/13 1406  Gross per 24 hour  Intake    630 ml  Output   1300 ml  Net   -670 ml   Exam: General: No acute respiratory distress - more alert and engaging today  Lungs: Clear to auscultation bilaterally without wheezes or crackles Cardiovascular: Regular rate and rhythm without murmur  gallop or rub  Abdomen: 2 drains on right side intact w/ no d/c or erythema at insertion sites, nondistended, soft, bowel sounds positive, no rebound, no ascites, no appreciable mass Extremities: No significant cyanosis, clubbing, or edema bilateral lower extremities  Data Reviewed: Basic Metabolic Panel:  Recent Labs Lab 08/18/13 0308 08/21/13 0557  NA 138 136*  K 3.8 3.3*  CL 106 99  CO2 23 25  GLUCOSE 126* 91  BUN 16 6  CREATININE 0.87 0.75  CALCIUM 7.9* 8.0*   Liver Function Tests:  Recent Labs Lab 08/21/13 0557  AST 29  ALT 36  ALKPHOS 186*  BILITOT 0.2*  PROT 6.1  ALBUMIN 1.6*   CBC:  Recent Labs Lab 08/17/13 0905 08/18/13 0308 08/19/13 0237 08/21/13 0557  WBC 11.9* 10.3 8.9 8.5  HGB 8.2* 7.8* 7.5* 7.1*  HCT 25.1* 23.6* 22.8* 21.5*  MCV 80.7 79.7 78.9 78.8  PLT 223 223 249 325   Cardiac Enzymes: No results found for this basename: CKTOTAL, CKMB, CKMBINDEX, TROPONINI,  in the last 168 hours CBG:  Recent Labs Lab 08/22/13 1155 08/22/13 1644 08/22/13 2135 08/23/13 0813 08/23/13 1153  GLUCAP 185* 193* 260* 158* 150*    Recent Results (from the past 240 hour(s))  URINE CULTURE     Status: None   Collection Time    08/14/13 12:17 AM      Result Value Ref Range Status   Specimen Description URINE, CATHETERIZED   Final   Special Requests NONE   Final   Culture  Setup Time     Final   Value: 08/15/2013 03:29     Performed at [facility]     Final   Value: NO GROWTH     Performed at [facility]   Culture     Final   Value: NO GROWTH     Performed at [facility]   Report Status 08/16/2013 FINAL   Final  CULTURE, BLOOD (ROUTINE X 2)     Status: None   Collection Time    08/14/13  2:38 PM      Result Value Ref Range Status   Specimen Description BLOOD ARM LEFT   Final   Special Requests BOTTLES DRAWN AEROBIC AND ANAEROBIC 5CC   Final   Culture  Setup Time     Final   Value: 08/14/2013 20:43      Performed at [facility]   Culture     Final   Value: ENTEROCOCCUS SPECIES     Note: COMBINATION THERAPY OF HIGH DOSE AMPICILLIN OR VANCOMYCIN, PLUS AN AMINOGLYCOSIDE, IS USUALLY INDICATED FOR SERIOUS ENTEROCOCCAL INFECTIONS.     MORGANELLA MORGANII     Note: Gram Stain Report Called to,Read Back By and Verified With: MORGAN SCHMITZ 08/15/12 0815 BY SMITHERSJ CRITICAL RESULT CALLED TO, READ BACK BY AND VERIFIED WITH: ROSEMARY CLARK 2/29/15 @ 10:46AM BY RUSCOE A.     Performed at [facility]  Report Status 08/18/2013 FINAL   Final   Organism ID, Bacteria ENTEROCOCCUS SPECIES   Final   Organism ID, Bacteria MORGANELLA MORGANII   Final  CULTURE, BLOOD (ROUTINE X 2)     Status: None   Collection Time    08/14/13  2:52 PM      Result Value Ref Range Status   Specimen Description BLOOD RIGHT FOREARM   Final   Special Requests BOTTLES DRAWN AEROBIC AND ANAEROBIC 5CC   Final   Culture  Setup Time     Final   Value: 08/14/2013 20:43     Performed at [facility]   Culture     Final   Value: ENTEROCOCCUS SPECIES     Note: SUSCEPTIBILITIES PERFORMED ON PREVIOUS CULTURE WITHIN THE LAST 5 DAYS.     Note: Gram Stain Report Called to,Read Back By and Verified With: Drema Dallas 08/15/13 0815 BY SMITHERSJ     Performed at [facility]   Report Status 08/17/2013 FINAL   Final  RAPID STREP SCREEN     Status: None   Collection Time    08/15/13 11:42 AM      Result Value Ref Range Status   Streptococcus, Group A Screen (Direct) NEGATIVE  NEGATIVE Final   Comment: (NOTE)     A Rapid Antigen test may result negative if the antigen level in the     sample is below the detection level of this test. The FDA has not     cleared this test as a stand-alone test therefore the rapid antigen     negative result has reflexed to a Group A Strep culture.  CULTURE, GROUP A STREP     Status: None   Collection Time    08/15/13 11:42 AM      Result Value Ref Range Status    Specimen Description THROAT   Final   Special Requests NONE   Final   Culture     Final   Value: No Beta Hemolytic Streptococci Isolated     Performed at [facility]   Report Status 08/17/2013 FINAL   Final  CULTURE, ROUTINE-ABSCESS     Status: None   Collection Time    08/15/13  6:17 PM      Result Value Ref Range Status   Specimen Description ABSCESS GALL BLADDER   Final   Special Requests RUQ IN THE GB FOSSA,SPECIMEN NO 1   Final   Gram Stain     Final   Value: FEW WBC PRESENT,BOTH PMN AND MONONUCLEAR     NO SQUAMOUS EPITHELIAL CELLS SEEN     RARE GRAM POSITIVE COCCI IN PAIRS     RARE GRAM POSITIVE RODS     Performed at [facility]   Culture     Final   Value: MULTIPLE ORGANISMS PRESENT, NONE PREDOMINANT     Note: NO STAPHYLOCOCCUS AUREUS ISOLATED NO GROUP A STREP (S.PYOGENES) ISOLATED     Performed at [facility]   Report Status 08/19/2013 FINAL   Final  CULTURE, ROUTINE-ABSCESS     Status: None   Collection Time    08/15/13  6:52 PM      Result Value Ref Range Status   Specimen Description ABSCESS   Final   Special Requests     Final   Value: MID EPIGASTRUM IN THE ANTERIOR PERI GASTRIC SAMPLE NO 2   Gram Stain     Final   Value: ABUNDANT WBC PRESENT, PREDOMINANTLY PMN  NO SQUAMOUS EPITHELIAL CELLS SEEN     ABUNDANT GRAM POSITIVE COCCI IN PAIRS     Performed at [facility]   Culture     Final   Value: ABUNDANT ENTEROCOCCUS SPECIES     Performed at [facility]   Report Status 08/19/2013 FINAL   Final   Organism ID, Bacteria ENTEROCOCCUS SPECIES   Final  CULTURE, BLOOD (ROUTINE X 2)     Status: None   Collection Time    08/16/13  1:50 PM      Result Value Ref Range Status   Specimen Description BLOOD LEFT ARM   Final   Special Requests BOTTLES DRAWN AEROBIC AND ANAEROBIC 10CC   Final   Culture  Setup Time     Final   Value: 08/16/2013 23:31     Performed at [facility]   Culture     Final   Value: NO GROWTH 5  DAYS     Performed at [facility]   Report Status 08/22/2013 FINAL   Final  CULTURE, BLOOD (ROUTINE X 2)     Status: None   Collection Time    08/16/13  1:55 PM      Result Value Ref Range Status   Specimen Description BLOOD LEFT ARM   Final   Special Requests BOTTLES DRAWN AEROBIC AND ANAEROBIC 10CC   Final   Culture  Setup Time     Final   Value: 08/16/2013 23:31     Performed at [facility]   Culture     Final   Value: NO GROWTH 5 DAYS     Performed at [facility]   Report Status 08/22/2013 FINAL   Final     Studies:  Recent x-ray studies have been reviewed in detail by the Attending Physician  Scheduled Meds:  Scheduled Meds: . amLODipine  10 mg Oral Daily  . ampicillin (OMNIPEN) IV  2 g Intravenous 4 times per day  . aspirin EC  81 mg Oral Daily  . dextromethorphan-guaiFENesin  1 tablet Oral BID  . docusate sodium  100 mg Oral BID  . enoxaparin (LOVENOX) injection  40 mg Subcutaneous Q24H  . feeding supplement (ENSURE COMPLETE)  237 mL Oral BID BM  . fluconazole  100 mg Oral Daily  . insulin aspart  0-15 Units Subcutaneous TID WC  . insulin aspart  0-5 Units Subcutaneous QHS  . insulin glargine  5 Units Subcutaneous QHS  . irbesartan  300 mg Oral QHS  . latanoprost  1 drop Both Eyes QHS  . multivitamin with minerals  1 tablet Oral Daily  . nystatin  5 mL Oral QID  . polyethylene glycol  17 g Oral TID  . predniSONE  5 mg Oral Q breakfast  . tamsulosin  0.4 mg Oral BID   Time spent on care of this patient: 25 min  Sheila Oats, MD [place]  [phone] Pager - Text Page per Shea Evans as per below:  On-Call/Text Page:      [email or web address]      password [email or web address]  If 7PM-7AM, please contact night-coverage [email or web address] Password [email or web address] 08/23/2013, 3:30 PM   LOS: 9 days

## 2013-08-23 NOTE — Progress Notes (Signed)
General Surgery Note  LOS: 9 days  POD -     Assessment/Plan: 1.  Open cholecystectomy - 07/25/2013 - H. Ingram  2.  Gall bladder fossa abscess  Drainage 08/15/2013  Enterococcus in blood  On Vanc/Zosyn/Ampicillin CT - 08/22/2013 - Near complete resolution of the large fluid collection in the  gallbladder fossa with drainage catheter in place. Only a small amount of fluid and gas remain present anterior to the liver edge. No measurable fluid remains in the subxiphoid region adjacent to the midline drainage catheter. Enlarging low-density area in the right hepatic lobe. Cannot exclude enlarging hepatic abscess. Moderate to large right pleural effusion and small left pleural effusion. Compressive atelectasis in the lower lobes.  Taking po's.  Seems to be doing okay.  Last WBC - 8,500 - 08/21/2013  Discussed CT findings with Dr. Kathlene Cote - will pull the current drains, one at a time, with 48 hours between pulls.  Plan to re-image the patient in one week to look at liver again (unless he gets worse).  3.  Thrush  On mycostatin/diflucan 4.  History of polio 5.  IDDM 6.  Arthritis - on chronic prednisone 7.  DVT prophylaxis - Lovenox 8.  Right pleural effusion 9.  Anemia  Hgb - 7.1 - 08/23/2013   Principal Problem:   Severe sepsis Active Problems:   Diabetes mellitus without complication   Hypertension   Stroke   Anemia   Right upper quadrant abdominal abscess   Hepatic abscess   Subjective:  Doing okay.  Taking po's, but not very ambulatory. Objective:   Filed Vitals:   08/23/13 0619  BP: 130/61  Pulse: 87  Temp: 98 F (36.7 C)  Resp: 17     Intake/Output from previous day:  03/06 0701 - 03/07 0700 In: 1110 [P.O.:940; IV Piggyback:150] Out: 4098 [Urine:1850; Drains:15]  Intake/Output this shift:      Physical Exam:   General: Older AA M who is alert and oriented.    HEENT: Normal. Pupils equal. .   Lungs: Decreased breath sounds both bases.   Abdomen: Mild  distention.   Wound: Has 2 drains - one RUQ and one midline.  #1/2 - 10/5 cc.  Fluid looks benign.   Lab Results:    Recent Labs  08/21/13 0557  WBC 8.5  HGB 7.1*  HCT 21.5*  PLT 325    BMET   Recent Labs  08/21/13 0557  NA 136*  K 3.3*  CL 99  CO2 25  GLUCOSE 91  BUN 6  CREATININE 0.75  CALCIUM 8.0*    PT/INR   Recent Labs  08/22/13 0705  LABPROT 16.4*  INR 1.36    ABG  No results found for this basename: PHART, PCO2, PO2, HCO3,  in the last 72 hours   Studies/Results:  Ct Abdomen Pelvis W Contrast  08/22/2013   CLINICAL DATA:  Prior percutaneous abscess drainage of right upper quadrant biloma/abscess. Now for follow-up.  EXAM: CT ABDOMEN AND PELVIS WITH CONTRAST  TECHNIQUE: Multidetector CT imaging of the abdomen and pelvis was performed using the standard protocol following bolus administration of intravenous contrast.  CONTRAST:  113mL OMNIPAQUE IOHEXOL 300 MG/ML SOLN. There was extravasation of contrast during injection. Approximately 80-90 cc of contrast was extravasated into the patient's right upper arm. Upon evaluation, the patient was without complaint. Patient has pulses in the right upper extremity were strong and normal. Pulses, sensory and motor function were normal and symmetric to the opposite side. The patient will  be followed by the radiology PA. Orders were placed in the patient's chart.  COMPARISON:  CT ABD/PELVIS W CM dated 08/14/2013  FINDINGS: Stable moderate to large right pleural effusion. New small left pleural effusion. Compressive atelectasis in the lower lobes bilaterally.  Patient is status post placement of 2 upper abdominal abscess drainage catheters, 1 in the postsurgical bed adjacent to the liver and the second in the midline anteriorly within the upper abdomen. The fluid collections in these areas have nearly completely resolved. A small residual fluid and gas collection anterior to the liver measures approximately 4.4 x 0.9 cm. This is smaller  than prior study and presumably communicates with the previously seen large fluid collection in the gallbladder fossa. No measurable fluid collection noted adjacent to the midline drainage catheter in the subxiphoid region.  There is a complex appearing low-density area within the right hepatic lobe posterior to the gallbladder fossa. This currently measures up to 6 cm. This previously measured up to 3.1 cm. With the contrast extravasation that occurred during the study, this is essentially a noncontrast study making it difficult to evaluate this lesion, but this has enlarged since prior study and appears complex. Cannot exclude hepatic abscess.  Appendix is visualized and is normal. Stomach, large and small bowel grossly unremarkable. Prostate is enlarged. Foley catheter is in place within the bladder which is otherwise grossly unremarkable.  Spleen, pancreas, adrenals have an unremarkable unenhanced appearance. Small low-density lesions in the kidneys bilaterally were shown on prior contrast study to represent cysts. No hydronephrosis. No free fluid or adenopathy.  No acute bony abnormality.  IMPRESSION: Near complete resolution of the large fluid collection in the gallbladder fossa with drainage catheter in place. Only a small amount of fluid and gas remain present anterior to the liver edge.  No measurable fluid remains in the subxiphoid region adjacent to the midline drainage catheter.  Enlarging low-density area in the right hepatic lobe. Cannot exclude enlarging hepatic abscess.  Moderate to large right pleural effusion and small left pleural effusion. Compressive atelectasis in the lower lobes.   Electronically Signed   By: Rolm Baptise M.D.   On: 08/22/2013 15:32     Anti-infectives:   Anti-infectives   Start     Dose/Rate Route Frequency Ordered Stop   08/18/13 1645  fluconazole (DIFLUCAN) tablet 100 mg     100 mg Oral Daily 08/18/13 1641     08/18/13 1200  ampicillin (OMNIPEN) 2 g in sodium  chloride 0.9 % 50 mL IVPB    Comments:  Pharmacy may adjust as needed   2 g 150 mL/hr over 20 Minutes Intravenous 4 times per day 08/18/13 1049     08/17/13 1000  fluconazole (DIFLUCAN) IVPB 100 mg  Status:  Discontinued     100 mg 50 mL/hr over 60 Minutes Intravenous Every 24 hours 08/17/13 0936 08/18/13 1641   08/16/13 1100  vancomycin (VANCOCIN) 500 mg in sodium chloride 0.9 % 100 mL IVPB  Status:  Discontinued     500 mg 100 mL/hr over 60 Minutes Intravenous Every 12 hours 08/16/13 1016 08/18/13 1049   08/15/13 1200  vancomycin (VANCOCIN) 1,250 mg in sodium chloride 0.9 % 250 mL IVPB  Status:  Discontinued     1,250 mg 166.7 mL/hr over 90 Minutes Intravenous Every 24 hours 08/14/13 1911 08/16/13 1016   08/15/13 0000  piperacillin-tazobactam (ZOSYN) IVPB 3.375 g  Status:  Discontinued     3.375 g 12.5 mL/hr over 240 Minutes Intravenous Every 8  hours 08/14/13 1911 08/18/13 1049   08/14/13 1530  vancomycin (VANCOCIN) IVPB 1000 mg/200 mL premix     1,000 mg 200 mL/hr over 60 Minutes Intravenous  Once 08/14/13 1526 08/14/13 1823   08/14/13 1530  piperacillin-tazobactam (ZOSYN) IVPB 3.375 g     3.375 g 100 mL/hr over 30 Minutes Intravenous  Once 08/14/13 1526 08/14/13 1603      Alphonsa Overall, MD, FACS Pager: [facility] Office: [phone] 08/23/2013

## 2013-08-24 LAB — CBC WITH DIFFERENTIAL/PLATELET
Basophils Absolute: 0 10*3/uL (ref 0.0–0.1)
Basophils Relative: 0 % (ref 0–1)
EOS PCT: 1 % (ref 0–5)
Eosinophils Absolute: 0.1 10*3/uL (ref 0.0–0.7)
HEMATOCRIT: 21.9 % — AB (ref 39.0–52.0)
HEMOGLOBIN: 7.1 g/dL — AB (ref 13.0–17.0)
LYMPHS PCT: 17 % (ref 12–46)
Lymphs Abs: 1.4 10*3/uL (ref 0.7–4.0)
MCH: 26.3 pg (ref 26.0–34.0)
MCHC: 32.4 g/dL (ref 30.0–36.0)
MCV: 81.1 fL (ref 78.0–100.0)
MONO ABS: 0.5 10*3/uL (ref 0.1–1.0)
MONOS PCT: 7 % (ref 3–12)
Neutro Abs: 6.2 10*3/uL (ref 1.7–7.7)
Neutrophils Relative %: 76 % (ref 43–77)
Platelets: 450 10*3/uL — ABNORMAL HIGH (ref 150–400)
RBC: 2.7 MIL/uL — ABNORMAL LOW (ref 4.22–5.81)
RDW: 17.7 % — AB (ref 11.5–15.5)
WBC: 8.2 10*3/uL (ref 4.0–10.5)

## 2013-08-24 LAB — GLUCOSE, CAPILLARY
GLUCOSE-CAPILLARY: 193 mg/dL — AB (ref 70–99)
GLUCOSE-CAPILLARY: 194 mg/dL — AB (ref 70–99)
GLUCOSE-CAPILLARY: 169 mg/dL — AB (ref 70–99)
Glucose-Capillary: 183 mg/dL — ABNORMAL HIGH (ref 70–99)

## 2013-08-24 NOTE — Progress Notes (Signed)
General Surgery Note  LOS: 10 days  POD -     Assessment/Plan: 1.  Open cholecystectomy - 07/25/2013 - H. Ingram  2.  Gall bladder fossa abscess and questionable intraparenchymal liver abscess  Drainage 08/15/2013  Enterococcus in blood  On Vanc/Zosyn/Ampicillin >>> CT - 08/22/2013 - Near complete resolution of the large fluid collection in the  gallbladder fossa with drainage catheter in place. Only a small amount of fluid and gas remain present anterior to the liver edge. No measurable fluid remains in the subxiphoid region adjacent to the midline drainage catheter. Enlarging low-density area in the right hepatic lobe. Cannot exclude enlarging hepatic abscess. Moderate to large right pleural effusion and small left pleural effusion. Compressive atelectasis in the lower lobes.  WBC - 8,200 - 08/24/2013  Discussed CT findings with Dr. Kathlene Cote on 08/23/2013 - will pull the current drains, one at a time, with 48 hours between pulls.  Plan to re-image the patient in one week to look at liver again (unless he gets worse).   One drain left only drained 15 cc last 24 hours  Overall looks good.  Probably needs to ambulate more.  3.  Thrush  On mycostatin/diflucan 4.  History of polio 5.  IDDM 6.  Arthritis - on chronic prednisone 7.  DVT prophylaxis - Lovenox 8.  Right pleural effusion 9.  Anemia  Hgb - 7.1 - 08/24/2013   Principal Problem:   Severe sepsis Active Problems:   Diabetes mellitus without complication   Hypertension   Stroke   Anemia   Right upper quadrant abdominal abscess   Hepatic abscess   Subjective:  Doing okay.  Taking po's, but not very ambulatory. Objective:   Filed Vitals:   08/24/13 0624  BP: 109/61  Pulse: 80  Temp: 98.2 F (36.8 C)  Resp: 17     Intake/Output from previous day:  03/07 0701 - 03/08 0700 In: 27 [P.O.:630; IV Piggyback:50] Out: 1015 [Urine:1000; Drains:15]  Intake/Output this shift:      Physical Exam:   General: Older AA M who is  alert and oriented.   Has breakfast of eggs and sausage.   HEENT: Normal. Pupils equal. .   Lungs: Decreased breath sounds both bases.   Abdomen: Mild distention.   Wound: Has 1 drains - RUQ - 15 cc last 24 hours  Lab Results:     Recent Labs  08/24/13 0457  WBC 8.2  HGB 7.1*  HCT 21.9*  PLT 450*    BMET  No results found for this basename: NA, K, CL, CO2, GLUCOSE, BUN, CREATININE, CALCIUM,  in the last 72 hours  PT/INR    Recent Labs  08/22/13 0705  LABPROT 16.4*  INR 1.36    ABG  No results found for this basename: PHART, PCO2, PO2, HCO3,  in the last 72 hours   Studies/Results:  Ct Abdomen Pelvis W Contrast  08/22/2013   CLINICAL DATA:  Prior percutaneous abscess drainage of right upper quadrant biloma/abscess. Now for follow-up.  EXAM: CT ABDOMEN AND PELVIS WITH CONTRAST  TECHNIQUE: Multidetector CT imaging of the abdomen and pelvis was performed using the standard protocol following bolus administration of intravenous contrast.  CONTRAST:  139mL OMNIPAQUE IOHEXOL 300 MG/ML SOLN. There was extravasation of contrast during injection. Approximately 80-90 cc of contrast was extravasated into the patient's right upper arm. Upon evaluation, the patient was without complaint. Patient has pulses in the right upper extremity were strong and normal. Pulses, sensory and motor function were normal  and symmetric to the opposite side. The patient will be followed by the radiology PA. Orders were placed in the patient's chart.  COMPARISON:  CT ABD/PELVIS W CM dated 08/14/2013  FINDINGS: Stable moderate to large right pleural effusion. New small left pleural effusion. Compressive atelectasis in the lower lobes bilaterally.  Patient is status post placement of 2 upper abdominal abscess drainage catheters, 1 in the postsurgical bed adjacent to the liver and the second in the midline anteriorly within the upper abdomen. The fluid collections in these areas have nearly completely resolved. A small  residual fluid and gas collection anterior to the liver measures approximately 4.4 x 0.9 cm. This is smaller than prior study and presumably communicates with the previously seen large fluid collection in the gallbladder fossa. No measurable fluid collection noted adjacent to the midline drainage catheter in the subxiphoid region.  There is a complex appearing low-density area within the right hepatic lobe posterior to the gallbladder fossa. This currently measures up to 6 cm. This previously measured up to 3.1 cm. With the contrast extravasation that occurred during the study, this is essentially a noncontrast study making it difficult to evaluate this lesion, but this has enlarged since prior study and appears complex. Cannot exclude hepatic abscess.  Appendix is visualized and is normal. Stomach, large and small bowel grossly unremarkable. Prostate is enlarged. Foley catheter is in place within the bladder which is otherwise grossly unremarkable.  Spleen, pancreas, adrenals have an unremarkable unenhanced appearance. Small low-density lesions in the kidneys bilaterally were shown on prior contrast study to represent cysts. No hydronephrosis. No free fluid or adenopathy.  No acute bony abnormality.  IMPRESSION: Near complete resolution of the large fluid collection in the gallbladder fossa with drainage catheter in place. Only a small amount of fluid and gas remain present anterior to the liver edge.  No measurable fluid remains in the subxiphoid region adjacent to the midline drainage catheter.  Enlarging low-density area in the right hepatic lobe. Cannot exclude enlarging hepatic abscess.  Moderate to large right pleural effusion and small left pleural effusion. Compressive atelectasis in the lower lobes.   Electronically Signed   By: Charlett Nose M.D.   On: 08/22/2013 15:32     Anti-infectives:   Anti-infectives   Start     Dose/Rate Route Frequency Ordered Stop   08/18/13 1645  fluconazole (DIFLUCAN)  tablet 100 mg     100 mg Oral Daily 08/18/13 1641     08/18/13 1200  ampicillin (OMNIPEN) 2 g in sodium chloride 0.9 % 50 mL IVPB    Comments:  Pharmacy may adjust as needed   2 g 150 mL/hr over 20 Minutes Intravenous 4 times per day 08/18/13 1049     08/17/13 1000  fluconazole (DIFLUCAN) IVPB 100 mg  Status:  Discontinued     100 mg 50 mL/hr over 60 Minutes Intravenous Every 24 hours 08/17/13 0936 08/18/13 1641   08/16/13 1100  vancomycin (VANCOCIN) 500 mg in sodium chloride 0.9 % 100 mL IVPB  Status:  Discontinued     500 mg 100 mL/hr over 60 Minutes Intravenous Every 12 hours 08/16/13 1016 08/18/13 1049   08/15/13 1200  vancomycin (VANCOCIN) 1,250 mg in sodium chloride 0.9 % 250 mL IVPB  Status:  Discontinued     1,250 mg 166.7 mL/hr over 90 Minutes Intravenous Every 24 hours 08/14/13 1911 08/16/13 1016   08/15/13 0000  piperacillin-tazobactam (ZOSYN) IVPB 3.375 g  Status:  Discontinued     3.375  g 12.5 mL/hr over 240 Minutes Intravenous Every 8 hours 08/14/13 1911 08/18/13 1049   08/14/13 1530  vancomycin (VANCOCIN) IVPB 1000 mg/200 mL premix     1,000 mg 200 mL/hr over 60 Minutes Intravenous  Once 08/14/13 1526 08/14/13 1823   08/14/13 1530  piperacillin-tazobactam (ZOSYN) IVPB 3.375 g     3.375 g 100 mL/hr over 30 Minutes Intravenous  Once 08/14/13 1526 08/14/13 1603      Alphonsa Overall, MD, FACS Pager: [facility] Office: [phone] 08/24/2013

## 2013-08-24 NOTE — Progress Notes (Signed)
Progress Note    Mike Mack [id number] DOB: 1935-09-18 DOA: 08/14/2013 PCP: [name], MD  Brief narrative: 78 y.o. male who presented on 08/14/2013 with a hx of HTN, HLD, prior stroke, recent laparoscopic cholecystectomy 07/25/2013 by [name] (discharged to [facility] 07/30/2013) with several days duration of persistent epigastric abdominal pain associated with fevers, chills, and malaise.   In the ED CT abdomen was notable for abscess in the gallbladder bed, as well as peri-hepatic abscesses. Surgery was consulted and [facility] was asked to admit for further evaluation. Pt started on Vancomycin and Zosyn in ED.  Subjective: Looking better today, and per wife eating a little more than he has been  Assessment/Plan:  Severe sepsis /  Enterococcus + ?Morganella bacteremia / subhepatic abscess /  epigastric abscess  - s/p lap cholecystectomy 07/25/2013 - now with 2 perc drains being managed by IR - bacteremia with enterococcus and ?morganella - ID has decided to focus on the Enterococcus (gram stain results not suggestive of active Morganella infection) - IV ampicillin for 4-6 weeks and follow for resolution- pt is improving steadily on his current tx regimen - repeat cultures negative  -PICC line placed 3/6 -Repeat CT scan of abdomen and pelvis 3/6 with near-complete resolution of the large fluid collection in the gallbladder fossa with drainage catheter in place. Only a small amount of fluid and gas remain present anterior edge of the liver. An enlarging low density area in the right hepatic lobe cannot exclude enlarging hepatic abscess>> for now no new drain placement recommended per IR -Appreciate surgery assistance drains to be removed-one today and the second one in 48 hours - lin am as per surgery note, follow  Diabetes mellitus with neuropathy - ok control at present, follow   Hypertension -Continue current meds, follow.  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 - given 1 U FFP prior to procedure - f/u normal - recheck on Friday   Code Status: FULL Family Communication: Wife at bedside Disposition Plan: SNF when medically ready - needs f/u CT/drain checks   Consultants: Gen Surg  IR ID  Procedures: 2/27 - abdom abscess drain x2 in IR  2/28 - TTE - mild LVH - EF 55-60% - no WMA - grade 1 DD  Antibiotics: Ampicillin 3/02 >> Diflucan 3/01 >>  Zosyn 2/26 >> 3/02 Vanc 2/26 >> 3/01  DVT prophylaxis: Lovenox  Objective: Filed Weights   08/22/13 0500 08/23/13 0500 08/24/13 1253  Weight: 63.141 kg (139 lb 3.2 oz) 60.873 kg (134 lb 3.2 oz) 57.244 kg (  126 lb 3.2 oz)   Blood pressure 114/60, pulse 86, temperature 98.4 F (36.9 C), temperature source Oral, resp. rate 16, height 5\' 5"  (1.651 m), weight 57.244 kg (126 lb 3.2 oz), SpO2 99.00%.  Intake/Output Summary (Last 24 hours) at 08/24/13 1854 Last data filed at 08/24/13 1709  Gross per 24 hour  Intake    715 ml  Output    880 ml  Net   -165 ml   Exam: General: No acute respiratory distress - more alert and engaging today  Lungs: Clear to auscultation bilaterally without wheezes  or crackles Cardiovascular: Regular rate and rhythm without murmur gallop or rub  Abdomen: 2 drains on right side intact w/ no d/c or erythema at insertion sites, nondistended, soft, bowel sounds positive, no rebound, no ascites, no appreciable mass Extremities: No significant cyanosis, clubbing, or edema bilateral lower extremities  Data Reviewed: Basic Metabolic Panel:  Recent Labs Lab 08/18/13 0308 08/21/13 0557  NA 138 136*  K 3.8 3.3*  CL 106 99  CO2 23 25  GLUCOSE 126* 91  BUN 16 6  CREATININE 0.87 0.75  CALCIUM 7.9* 8.0*   Liver Function Tests:  Recent Labs Lab 08/21/13 0557  AST 29  ALT 36  ALKPHOS 186*  BILITOT 0.2*  PROT 6.1  ALBUMIN 1.6*   CBC:  Recent Labs Lab 08/18/13 0308 08/19/13 0237 08/21/13 0557 08/24/13 0457  WBC 10.3 8.9 8.5 8.2  NEUTROABS  --   --   --  6.2  HGB 7.8* 7.5* 7.1* 7.1*  HCT 23.6* 22.8* 21.5* 21.9*  MCV 79.7 78.9 78.8 81.1  PLT 223 249 325 450*   Cardiac Enzymes: No results found for this basename: CKTOTAL, CKMB, CKMBINDEX, TROPONINI,  in the last 168 hours CBG:  Recent Labs Lab 08/23/13 1715 08/23/13 2130 08/24/13 0701 08/24/13 1159 08/24/13 1654  GLUCAP 212* 167* 183* 193* 194*    Recent Results (from the past 240 hour(s))  RAPID STREP SCREEN     Status: None   Collection Time    08/15/13 11:42 AM      Result Value Ref Range Status   Streptococcus, Group A Screen (Direct) NEGATIVE  NEGATIVE Final   Comment: (NOTE)     A Rapid Antigen test may result negative if the antigen level in the     sample is below the detection level of this test. The FDA has not     cleared this test as a stand-alone test therefore the rapid antigen     negative result has reflexed to a Group A Strep culture.  CULTURE, GROUP A STREP     Status: None   Collection Time    08/15/13 11:42 AM      Result Value Ref Range Status   Specimen Description THROAT   Final   Special Requests NONE   Final   Culture     Final   Value: No Beta  Hemolytic Streptococci Isolated     Performed at [facility]   Report Status 08/17/2013 FINAL   Final  CULTURE, ROUTINE-ABSCESS     Status: None   Collection Time    08/15/13  6:17 PM      Result Value Ref Range Status   Specimen Description ABSCESS GALL BLADDER   Final   Special Requests RUQ IN THE GB FOSSA,SPECIMEN NO 1   Final   Gram Stain     Final   Value: FEW WBC PRESENT,BOTH PMN AND MONONUCLEAR     NO  SQUAMOUS EPITHELIAL CELLS SEEN     RARE GRAM POSITIVE COCCI IN PAIRS     RARE GRAM POSITIVE RODS     Performed at [facility]   Culture     Final   Value: MULTIPLE ORGANISMS PRESENT, NONE PREDOMINANT     Note: NO STAPHYLOCOCCUS AUREUS ISOLATED NO GROUP A STREP (S.PYOGENES) ISOLATED     Performed at [facility]   Report Status 08/19/2013 FINAL   Final  CULTURE, ROUTINE-ABSCESS     Status: None   Collection Time    08/15/13  6:52 PM      Result Value Ref Range Status   Specimen Description ABSCESS   Final   Special Requests     Final   Value: MID EPIGASTRUM IN THE ANTERIOR PERI GASTRIC SAMPLE NO 2   Gram Stain     Final   Value: ABUNDANT WBC PRESENT, PREDOMINANTLY PMN     NO SQUAMOUS EPITHELIAL CELLS SEEN     ABUNDANT GRAM POSITIVE COCCI IN PAIRS     Performed at [facility]   Culture     Final   Value: ABUNDANT ENTEROCOCCUS SPECIES     Performed at [facility]   Report Status 08/19/2013 FINAL   Final   Organism ID, Bacteria ENTEROCOCCUS SPECIES   Final  CULTURE, BLOOD (ROUTINE X 2)     Status: None   Collection Time    08/16/13  1:50 PM      Result Value Ref Range Status   Specimen Description BLOOD LEFT ARM   Final   Special Requests BOTTLES DRAWN AEROBIC AND ANAEROBIC 10CC   Final   Culture  Setup Time     Final   Value: 08/16/2013 23:31     Performed at [facility]   Culture     Final   Value: NO GROWTH 5 DAYS     Performed at [facility]   Report Status 08/22/2013 FINAL   Final  CULTURE, BLOOD  (ROUTINE X 2)     Status: None   Collection Time    08/16/13  1:55 PM      Result Value Ref Range Status   Specimen Description BLOOD LEFT ARM   Final   Special Requests BOTTLES DRAWN AEROBIC AND ANAEROBIC 10CC   Final   Culture  Setup Time     Final   Value: 08/16/2013 23:31     Performed at [facility]   Culture     Final   Value: NO GROWTH 5 DAYS     Performed at [facility]   Report Status 08/22/2013 FINAL   Final     Studies:  Recent x-ray studies have been reviewed in detail by the Attending Physician  Scheduled Meds:  Scheduled Meds: . amLODipine  10 mg Oral Daily  . ampicillin (OMNIPEN) IV  2 g Intravenous 4 times per day  . aspirin EC  81 mg Oral Daily  . dextromethorphan-guaiFENesin  1 tablet Oral BID  . docusate sodium  100 mg Oral BID  . enoxaparin (LOVENOX) injection  40 mg Subcutaneous Q24H  . feeding supplement (ENSURE COMPLETE)  237 mL Oral BID BM  . fluconazole  100 mg Oral Daily  . insulin aspart  0-15 Units Subcutaneous TID WC  . insulin aspart  0-5 Units Subcutaneous QHS  . insulin glargine  5 Units Subcutaneous QHS  . irbesartan  300 mg Oral QHS  . latanoprost  1 drop Both Eyes  QHS  . multivitamin with minerals  1 tablet Oral Daily  . nystatin  5 mL Oral QID  . polyethylene glycol  17 g Oral TID  . predniSONE  5 mg Oral Q breakfast  . tamsulosin  0.4 mg Oral BID   Time spent on care of this patient: 25 min  Sheila Oats, MD [place]  [phone] Pager - Text Page per Shea Evans as per below:  On-Call/Text Page:      [email or web address]      password [email or web address]  If 7PM-7AM, please contact night-coverage [email or web address] Password [email or web address] 08/24/2013, 6:54 PM   LOS: 10 days

## 2013-08-25 DIAGNOSIS — R7881 Bacteremia: Secondary | ICD-10-CM

## 2013-08-25 DIAGNOSIS — B952 Enterococcus as the cause of diseases classified elsewhere: Secondary | ICD-10-CM

## 2013-08-25 DIAGNOSIS — K769 Liver disease, unspecified: Secondary | ICD-10-CM

## 2013-08-25 DIAGNOSIS — R5381 Other malaise: Secondary | ICD-10-CM

## 2013-08-25 LAB — GLUCOSE, CAPILLARY
GLUCOSE-CAPILLARY: 119 mg/dL — AB (ref 70–99)
GLUCOSE-CAPILLARY: 179 mg/dL — AB (ref 70–99)
Glucose-Capillary: 300 mg/dL — ABNORMAL HIGH (ref 70–99)
Glucose-Capillary: 260 mg/dL — ABNORMAL HIGH (ref 70–99)

## 2013-08-25 LAB — CBC
HCT: 21.4 % — ABNORMAL LOW (ref 39.0–52.0)
HEMOGLOBIN: 7 g/dL — AB (ref 13.0–17.0)
MCH: 26.2 pg (ref 26.0–34.0)
MCHC: 32.7 g/dL (ref 30.0–36.0)
MCV: 80.1 fL (ref 78.0–100.0)
Platelets: 499 10*3/uL — ABNORMAL HIGH (ref 150–400)
RBC: 2.67 MIL/uL — ABNORMAL LOW (ref 4.22–5.81)
RDW: 17.9 % — ABNORMAL HIGH (ref 11.5–15.5)
WBC: 8.9 10*3/uL (ref 4.0–10.5)

## 2013-08-25 MED ORDER — PIPERACILLIN-TAZOBACTAM 3.375 G IVPB
3.3750 g | Freq: Three times a day (TID) | INTRAVENOUS | Status: DC
  Administered 2013-08-25 – 2013-09-01 (×22): 3.375 g via INTRAVENOUS
  Filled 2013-08-25 (×24): qty 50

## 2013-08-25 NOTE — Progress Notes (Signed)
ANTIBIOTIC CONSULT NOTE - INITIAL  Pharmacy Consult for Zosyn Indication: Intra-abdominal infection.  No Known Allergies  Patient Measurements: Height: _0  (165.1 cm) Weight: 131 lb 2.8 oz (59.5 kg) IBW/kg (Calculated) : 61.5 Adjusted Body Weight:   Vital Signs: Temp: 98.4 F (36.9 C) (03/09 0451) Temp src: Oral (03/09 0451) BP: 115/54 mmHg (03/09 0451) Pulse Rate: 77 (03/09 0451) Intake/Output from previous day: 03/08 0701 - 03/09 0700 In: 935 [P.O.:720; IV Piggyback:200] Out: 905 [Urine:875; Drains:30] Intake/Output from this shift:    Labs:  Recent Labs  08/24/13 0457 08/25/13 0450  WBC 8.2 8.9  HGB 7.1* 7.0*  PLT 450* 499*   Estimated Creatinine Clearance: 67.1 ml/min (by C-G formula based on Cr of 0.75). No results found for this basename: VANCOTROUGH, Corlis Leak, VANCORANDOM, [place], GENTPEAK, GENTRANDOM, TOBRATROUGH, TOBRAPEAK, TOBRARND, AMIKACINPEAK, AMIKACINTROU, AMIKACIN,  in the last 72 hours   Microbiology: Recent Results (from the past 720 hour(s))  URINE CULTURE     Status: None   Collection Time    07/28/13  3:22 PM      Result Value Ref Range Status   Specimen Description URINE, CLEAN CATCH   Final   Special Requests NONE   Final   Culture  Setup Time     Final   Value: 07/28/2013 20:55     Performed at [place]     Final   Value: NO GROWTH     Performed at [facility]   Culture     Final   Value: NO GROWTH     Performed at [facility]   Report Status 07/29/2013 FINAL   Final  URINE CULTURE     Status: None   Collection Time    08/14/13 12:17 AM      Result Value Ref Range Status   Specimen Description URINE, CATHETERIZED   Final   Special Requests NONE   Final   Culture  Setup Time     Final   Value: 08/15/2013 03:29     Performed at [place]     Final   Value: NO GROWTH     Performed at [facility]   Culture     Final   Value: NO GROWTH      Performed at [facility]   Report Status 08/16/2013 FINAL   Final  CULTURE, BLOOD (ROUTINE X 2)     Status: None   Collection Time    08/14/13  2:38 PM      Result Value Ref Range Status   Specimen Description BLOOD ARM LEFT   Final   Special Requests BOTTLES DRAWN AEROBIC AND ANAEROBIC 5CC   Final   Culture  Setup Time     Final   Value: 08/14/2013 20:43     Performed at [facility]   Culture     Final   Value: ENTEROCOCCUS SPECIES     Note: COMBINATION THERAPY OF HIGH DOSE AMPICILLIN OR VANCOMYCIN, PLUS AN AMINOGLYCOSIDE, IS USUALLY INDICATED FOR SERIOUS ENTEROCOCCAL INFECTIONS.     MORGANELLA MORGANII     Note: Gram Stain Report Called to,Read Back By and Verified With: MORGAN SCHMITZ 08/15/12 0815 BY SMITHERSJ CRITICAL RESULT CALLED TO, READ BACK BY AND VERIFIED WITH: ROSEMARY CLARK 2/29/15 @ 10:46AM BY RUSCOE A.     Performed at [facility]   Report Status 08/18/2013 FINAL   Final   Organism ID, Bacteria ENTEROCOCCUS SPECIES  Final   Organism ID, Bacteria MORGANELLA MORGANII   Final  CULTURE, BLOOD (ROUTINE X 2)     Status: None   Collection Time    08/14/13  2:52 PM      Result Value Ref Range Status   Specimen Description BLOOD RIGHT FOREARM   Final   Special Requests BOTTLES DRAWN AEROBIC AND ANAEROBIC 5CC   Final   Culture  Setup Time     Final   Value: 08/14/2013 20:43     Performed at [facility]   Culture     Final   Value: ENTEROCOCCUS SPECIES     Note: SUSCEPTIBILITIES PERFORMED ON PREVIOUS CULTURE WITHIN THE LAST 5 DAYS.     Note: Gram Stain Report Called to,Read Back By and Verified With: Drema Dallas 08/15/13 0815 BY SMITHERSJ     Performed at [facility]   Report Status 08/17/2013 FINAL   Final  RAPID STREP SCREEN     Status: None   Collection Time    08/15/13 11:42 AM      Result Value Ref Range Status   Streptococcus, Group A Screen (Direct) NEGATIVE  NEGATIVE Final   Comment: (NOTE)     A Rapid Antigen test  may result negative if the antigen level in the     sample is below the detection level of this test. The FDA has not     cleared this test as a stand-alone test therefore the rapid antigen     negative result has reflexed to a Group A Strep culture.  CULTURE, GROUP A STREP     Status: None   Collection Time    08/15/13 11:42 AM      Result Value Ref Range Status   Specimen Description THROAT   Final   Special Requests NONE   Final   Culture     Final   Value: No Beta Hemolytic Streptococci Isolated     Performed at [facility]   Report Status 08/17/2013 FINAL   Final  CULTURE, ROUTINE-ABSCESS     Status: None   Collection Time    08/15/13  6:17 PM      Result Value Ref Range Status   Specimen Description ABSCESS GALL BLADDER   Final   Special Requests RUQ IN THE GB FOSSA,SPECIMEN NO 1   Final   Gram Stain     Final   Value: FEW WBC PRESENT,BOTH PMN AND MONONUCLEAR     NO SQUAMOUS EPITHELIAL CELLS SEEN     RARE GRAM POSITIVE COCCI IN PAIRS     RARE GRAM POSITIVE RODS     Performed at [facility]   Culture     Final   Value: MULTIPLE ORGANISMS PRESENT, NONE PREDOMINANT     Note: NO STAPHYLOCOCCUS AUREUS ISOLATED NO GROUP A STREP (S.PYOGENES) ISOLATED     Performed at [facility]   Report Status 08/19/2013 FINAL   Final  CULTURE, ROUTINE-ABSCESS     Status: None   Collection Time    08/15/13  6:52 PM      Result Value Ref Range Status   Specimen Description ABSCESS   Final   Special Requests     Final   Value: MID EPIGASTRUM IN THE ANTERIOR PERI GASTRIC SAMPLE NO 2   Gram Stain     Final   Value: ABUNDANT WBC PRESENT, PREDOMINANTLY PMN     NO SQUAMOUS EPITHELIAL CELLS SEEN     ABUNDANT GRAM POSITIVE COCCI  IN PAIRS     Performed at [facility]   Culture     Final   Value: ABUNDANT ENTEROCOCCUS SPECIES     Performed at [facility]   Report Status 08/19/2013 FINAL   Final   Organism ID, Bacteria ENTEROCOCCUS SPECIES   Final   CULTURE, BLOOD (ROUTINE X 2)     Status: None   Collection Time    08/16/13  1:50 PM      Result Value Ref Range Status   Specimen Description BLOOD LEFT ARM   Final   Special Requests BOTTLES DRAWN AEROBIC AND ANAEROBIC 10CC   Final   Culture  Setup Time     Final   Value: 08/16/2013 23:31     Performed at [facility]   Culture     Final   Value: NO GROWTH 5 DAYS     Performed at [facility]   Report Status 08/22/2013 FINAL   Final  CULTURE, BLOOD (ROUTINE X 2)     Status: None   Collection Time    08/16/13  1:55 PM      Result Value Ref Range Status   Specimen Description BLOOD LEFT ARM   Final   Special Requests BOTTLES DRAWN AEROBIC AND ANAEROBIC 10CC   Final   Culture  Setup Time     Final   Value: 08/16/2013 23:31     Performed at [facility]   Culture     Final   Value: NO GROWTH 5 DAYS     Performed at [facility]   Report Status 08/22/2013 FINAL   Final    Medical History: Past Medical History  Diagnosis Date  . Hypertension   . Small bowel obstruction   . BPH (benign prostatic hyperplasia)   . Neuropathy   . Polio   . Polio Childhood  . Constipation 05/13/2013  . Anemia 05/13/2013  . Type II diabetes mellitus   . Exertional shortness of breath     "sometimes" (07/25/2013)  . History of stomach ulcers   . Stroke 2014    residual:  "left hand kind of numb" (07/25/2013)  . Arthritis     "right leg" (07/25/2013)    Medications:  Prescriptions prior to admission  Medication Sig Dispense Refill  . [EXPIRED] Ciprofloxacin (CIPRO IV) Inject 400 mg into the vein 2 (two) times daily. Med to stop on 08/17/13      . docusate sodium 100 MG CAPS Take 100 mg by mouth 2 (two) times daily.  10 capsule  0  . gabapentin (NEURONTIN) 800 MG tablet Take 800 mg by mouth 3 (three) times daily as needed (HEADACHES).       Marland Kitchen glucagon (GLUCAGEN) 1 MG SOLR injection Inject 1 mg into the vein once as needed for low blood sugar.      . insulin  aspart (NOVOLOG) 100 UNIT/ML injection Inject 5 Units into the skin 3 (three) times daily before meals.       . insulin glargine (LANTUS) 100 units/mL SOLN Inject 5 Units into the skin at bedtime.       . irbesartan (AVAPRO) 300 MG tablet Take 300 mg by mouth at bedtime.      . metroNIDAZOLE (FLAGYL) 5-0.79 MG/ML-% IVPB Inject 500 mg into the vein every 6 (six) hours.      Marland Kitchen nystatin (MYCOSTATIN) 100000 UNIT/ML suspension Take 5 mLs by mouth 2 (two) times daily. For 1 week, then re-access if need to  continue, last dose due on 2/26      . polyethylene glycol (MIRALAX / GLYCOLAX) packet Take 17 g by mouth 3 (three) times daily.  14 each  0  . predniSONE (DELTASONE) 5 MG tablet Take 5 mg by mouth daily with breakfast.      . Tamsulosin HCl (FLOMAX) 0.4 MG CAPS Take 0.4 mg by mouth 2 (two) times daily.       . Travoprost, BAK Free, (TRAVATAN) 0.004 % SOLN ophthalmic solution Place 1 drop into both eyes at bedtime.       Assessment: 78 yo M who is from [facility] s/p recent cholecystectomy (2/6) presents with intra-abd abscess.   Anticoagulation: none PTA. Baseline INR 1.7 on admit. Lovenox 32m/day for prophylaxis. Hgb down to 7.  Infectious Disease: Resume Vancomycin for intra-abdominal abscess. Initial bacteremia with enterococcus and Morganella and Gram stain of intra-abdominal abscess fluid suggest a polymicrobial infection. His CT shows a question of an enlarging low-density area in the right lobe of the liver. ID wants to broaden abx coverage for Morganella and anaerobes in addition to enterococcus and yeast.  2/26 Vanc>>3/2 2/26 Zosyn>>3/2, resume 3/9>> 3/2 Fluconazole>>  2/26 Bld x2>> 2/2 Enterococcus (sens amp, gent, vanc) 2/26 Urine>> neg 2/27 GAS neg 2/27 Abcess (mid epigastrum)>> gm stain w/ abundant GPC pairs 2/27 Abcess (gallbladder) >> gm stain w/ rare GPC pairs, GPR  Cardiovascular: HTN. TTE - EF 55-60%, VSS on ASA81, Norvasc10, Avapro  Endocrinology: DM. CBGs 119-300. SSI and lantus.  Pred 520mdaily (on PTA). A1c 7.3.   Gastrointestinal / Nutrition: NPO. LFTs ok except for alk phos 186.  Neurology: Neuropathy, CVA, hx polio  Nephrology: SCr stable 0.75, BPH - tamsulosin.  Pulmonary: RA. Mucinex DM,  Hematology / Oncology: Hgb down to 7.  PTA Medication Issues: Not resumed: gabapentin  Best Practices: Enox    Plan:  Zosyn 3.375g IV q8hr.   Delton Stelle S. RoAlford HighlandPharmD, BCPS Clinical Staff Pharmacist Pager [phone] Ghazitillinger 08/25/2013,1:51 PM

## 2013-08-25 NOTE — Clinical Social Work Note (Signed)
After reviewing pt's chart, CSW called admissions liaison at [facility] to inform her that pt could potentially be ready for discharge during this week. Per admissions liaison, [facility] is ready for pt's return once medically stable for discharge. CSW to continue to follow and assist with discharge planning needs.  Pati Gallo, [place] Social Worker [phone]

## 2013-08-25 NOTE — Progress Notes (Addendum)
Subjective: Remains  alert and stable. Afebrile.   deconditioned. Midline abdominal drain removed on March 7. Remaining right upper quadrant drain drained 30 cc per 24 hours. IR to assess for removal today. Remains on Ampicillin and Diflucan. Apparently vancomycin has been discontinued.  Objective: Vital signs in last 24 hours: Temp:  [98.4 F (36.9 C)-98.6 F (37 C)] 98.4 F (36.9 C) (03/09 0451) Pulse Rate:  [77-89] 77 (03/09 0451) Resp:  [16-18] 17 (03/09 0451) BP: (114-122)/(54-62) 115/54 mmHg (03/09 0451) SpO2:  [97 %-99 %] 99 % (03/09 0451) Weight:  [126 lb 3.2 oz (57.244 kg)-131 lb 2.8 oz (59.5 kg)] 131 lb 2.8 oz (59.5 kg) (03/09 0451) Last BM Date: 08/23/13  Intake/Output from previous day: 03/08 0701 - 03/09 0700 In: 935 [P.O.:720; IV Piggyback:200] Out: 905 [Urine:875; Drains:30] Intake/Output this shift:    General appearance: alert. Cooperative. Febrile and deconditioned. No acute distress. GI: abdomen soft and nontender. Right upper quadrant drain remains in place with serous drainage.  Lab Results:   Recent Labs  08/24/13 0457 08/25/13 0450  WBC 8.2 8.9  HGB 7.1* 7.0*  HCT 21.9* 21.4*  PLT 450* 499*   BMET No results found for this basename: NA, K, CL, CO2, GLUCOSE, BUN, CREATININE, CALCIUM,  in the last 72 hours PT/INR No results found for this basename: LABPROT, INR,  in the last 72 hours ABG No results found for this basename: PHART, PCO2, PO2, HCO3,  in the last 72 hours  Studies/Results: No results found.  Anti-infectives: Anti-infectives   Start     Dose/Rate Route Frequency Ordered Stop   08/18/13 1645  fluconazole (DIFLUCAN) tablet 100 mg     100 mg Oral Daily 08/18/13 1641     08/18/13 1200  ampicillin (OMNIPEN) 2 g in sodium chloride 0.9 % 50 mL IVPB    Comments:  Pharmacy may adjust as needed   2 g 150 mL/hr over 20 Minutes Intravenous 4 times per day 08/18/13 1049     08/17/13 1000  fluconazole (DIFLUCAN) IVPB 100 mg  Status:   Discontinued     100 mg 50 mL/hr over 60 Minutes Intravenous Every 24 hours 08/17/13 0936 08/18/13 1641   08/16/13 1100  vancomycin (VANCOCIN) 500 mg in sodium chloride 0.9 % 100 mL IVPB  Status:  Discontinued     500 mg 100 mL/hr over 60 Minutes Intravenous Every 12 hours 08/16/13 1016 08/18/13 1049   08/15/13 1200  vancomycin (VANCOCIN) 1,250 mg in sodium chloride 0.9 % 250 mL IVPB  Status:  Discontinued     1,250 mg 166.7 mL/hr over 90 Minutes Intravenous Every 24 hours 08/14/13 1911 08/16/13 1016   08/15/13 0000  piperacillin-tazobactam (ZOSYN) IVPB 3.375 g  Status:  Discontinued     3.375 g 12.5 mL/hr over 240 Minutes Intravenous Every 8 hours 08/14/13 1911 08/18/13 1049   08/14/13 1530  vancomycin (VANCOCIN) IVPB 1000 mg/200 mL premix     1,000 mg 200 mL/hr over 60 Minutes Intravenous  Once 08/14/13 1526 08/14/13 1823   08/14/13 1530  piperacillin-tazobactam (ZOSYN) IVPB 3.375 g     3.375 g 100 mL/hr over 30 Minutes Intravenous  Once 08/14/13 1526 08/14/13 1603      Assessment/Plan:  1. Laparoscopic cholecystectomy - 07/25/2013 - H. Dasan Hardman   2. Gall bladder fossa abscess and questionable intraparenchymal liver abscess  Drainage 08/15/2013  Enterococcus in blood  On diflucan/Ampicillin - ID following CT - 08/22/2013 - Near complete resolution of the large fluid collection in the  gallbladder fossa with drainage catheter in place. Only a small amount of fluid and gas remain present anterior to the liver edge. No measurable fluid remains in the subxiphoid region adjacent to the midline drainage catheter. Enlarging low-density area in the right hepatic lobe. Cannot exclude enlarging hepatic abscess. Moderate to large right pleural effusion and small left pleural effusion. Compressive atelectasis in the lower lobes.  WBC - 8,900 - 08/25/2013 - po prednisone Rec CT Wed./Thurs. Discussed CT findings with Dr. Kathlene Cote on 08/23/2013 - One drain out Saturday. IR to evaluate for removal of the  other drain today..  Overall looks good. Probably needs to ambulate more.   3. Thrush  On mycostatin/diflucan. swallowing a bit better.  4. History of polio  5. IDDM  6. Arthritis - on chronic prednisone  7. DVT prophylaxis - Lovenox  8. Right pleural effusion  9. Anemia  Hgb - 7.1 - 08/24/2013      LOS: 11 days    Mike Mack M 08/25/2013

## 2013-08-25 NOTE — Progress Notes (Signed)
Progress Note    Mike Mack VPX:[id number] DOB: 12-Dec-1935 DOA: 08/14/2013 PCP: [name], MD  Brief narrative: 78 y.o. male who presented on 08/14/2013 with a hx of HTN, HLD, prior stroke, recent laparoscopic cholecystectomy 07/25/2013 by [name] (discharged to [facility] 07/30/2013) with several days duration of persistent epigastric abdominal pain associated with fevers, chills, and malaise.   In the ED CT abdomen was notable for abscess in the gallbladder bed, as well as peri-hepatic abscesses. Surgery was consulted and [facility] was asked to admit for further evaluation. Pt started on Vancomycin and Zosyn in ED.  Subjective: Doing okay today, denies any new complaints.  Assessment/Plan:  Severe sepsis /  Enterococcus + ?Morganella bacteremia / subhepatic abscess /  epigastric abscess  - s/p lap cholecystectomy 07/25/2013 - now with 2 perc drains being managed by IR - bacteremia with enterococcus and ?morganella - ID has decided to focus on the Enterococcus (gram stain results not suggestive of active Morganella infection) - IV ampicillin for 4-6 weeks and follow for resolution- pt is improving steadily on his current tx regimen - repeat cultures negative  -PICC line placed 3/6 -Repeat CT scan of abdomen and pelvis 3/6 with near-complete resolution of the large fluid collection in the gallbladder fossa with drainage catheter in place. Only a small amount of fluid and gas remain present anterior edge of the liver. An enlarging low density area in the right hepatic lobe cannot exclude enlarging hepatic abscess>> for now no new drain placement recommended per IR -Appreciate surgery assistance drains to be removed-one 3/7and the second one in 48 hours -which would've been today 3/9 but IR  recommending to followup remove in a.m. 3/10  Diabetes mellitus with neuropathy - ok control at present, follow   Hypertension -Continue current meds, follow.  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 Chronic steroid use - no need for stress dose steroids currently - unclear why pt is on chronic steroids (?arthritits) - suggest very slow wean to off as outpt if able   Thrush - cont diflucan - has sore throat as well which may be consistent with candida esophagitis - sx improving slowly   Dysphagia - likely due to above - follow with Diflucan   BPH - Flomax  Elevated INR - due to poor nutrition? - given 1 U FFP prior to procedure - f/u normal - recheck on Friday   Code Status: FULL Family Communication: Wife at bedside Disposition Plan: SNF when medically ready - needs f/u CT/drain checks   Consultants: Gen Surg  IR ID  Procedures: 2/27 - abdom abscess drain x2 in IR  2/28 - TTE - mild LVH - EF 55-60% - no WMA - grade 1 DD  Antibiotics: Ampicillin 3/02 >> Diflucan 3/01 >>  Zosyn 2/26 >> 3/02 Vanc 2/26 >> 3/01  DVT prophylaxis: Lovenox  Objective: Filed Weights   08/23/13 0500 08/24/13 1253 08/25/13 0451  Weight: 60.873 kg (134 lb 3.2 oz) 57.244 kg (126 lb 3.2 oz)  59.5 kg (131 lb 2.8 oz)   Blood pressure 112/58, pulse 82, temperature 98.6 F (37 C), temperature source Oral, resp. rate 16, height 5\' 5"  (1.651 m), weight 59.5 kg (131 lb 2.8 oz), SpO2 98.00%.  Intake/Output Summary (Last 24 hours) at 08/25/13 1845 Last data filed at 08/25/13 1738  Gross per 24 hour  Intake    975 ml  Output   1435 ml  Net   -460 ml   Exam: General: No acute respiratory distress - more alert and engaging today  Lungs: Clear to  auscultation bilaterally without wheezes or crackles Cardiovascular: Regular rate and rhythm without murmur gallop or rub  Abdomen: 1 drain on right side intact w/ no d/c or erythema at insertion sites, nondistended, soft, bowel sounds positive, no rebound, no ascites, no appreciable mass Extremities: No significant cyanosis, clubbing, or edema bilateral lower extremities  Data Reviewed: Basic Metabolic Panel:  Recent Labs Lab 08/21/13 0557  NA 136*  K 3.3*  CL 99  CO2 25  GLUCOSE 91  BUN 6  CREATININE 0.75  CALCIUM 8.0*   Liver Function Tests:  Recent Labs Lab 08/21/13 0557  AST 29  ALT 36  ALKPHOS 186*  BILITOT 0.2*  PROT 6.1  ALBUMIN 1.6*   CBC:  Recent Labs Lab 08/19/13 0237 08/21/13 0557 08/24/13 0457 08/25/13 0450  WBC 8.9 8.5 8.2 8.9  NEUTROABS  --   --  6.2  --   HGB 7.5* 7.1* 7.1* 7.0*  HCT 22.8* 21.5* 21.9* 21.4*  MCV 78.9 78.8 81.1 80.1  PLT 249 325 450* 499*   Cardiac Enzymes: No results found for this basename: CKTOTAL, CKMB, CKMBINDEX, TROPONINI,  in the last 168 hours CBG:  Recent Labs Lab 08/24/13 1654 08/24/13 2124 08/25/13 0757 08/25/13 1155 08/25/13 1731  GLUCAP 194* 169* 119* 300* 260*    Recent Results (from the past 240 hour(s))  CULTURE, ROUTINE-ABSCESS     Status: None   Collection Time    08/15/13  6:52 PM      Result Value Ref Range Status   Specimen Description ABSCESS   Final   Special Requests     Final   Value: MID EPIGASTRUM IN THE ANTERIOR PERI GASTRIC SAMPLE NO 2   Gram Stain     Final   Value: ABUNDANT WBC PRESENT, PREDOMINANTLY PMN     NO SQUAMOUS EPITHELIAL CELLS SEEN     ABUNDANT GRAM POSITIVE COCCI IN PAIRS     Performed at [facility]   Culture     Final   Value: ABUNDANT ENTEROCOCCUS SPECIES     Performed at [facility]   Report Status 08/19/2013 FINAL   Final   Organism ID, Bacteria ENTEROCOCCUS SPECIES   Final  CULTURE, BLOOD (ROUTINE X 2)     Status: None   Collection Time     08/16/13  1:50 PM      Result Value Ref Range Status   Specimen Description BLOOD LEFT ARM   Final   Special Requests BOTTLES DRAWN AEROBIC AND ANAEROBIC 10CC   Final   Culture  Setup Time     Final   Value: 08/16/2013 23:31     Performed at [facility]   Culture     Final   Value: NO GROWTH 5 DAYS     Performed at [facility]   Report Status 08/22/2013 FINAL   Final  CULTURE, BLOOD (ROUTINE X 2)     Status: None  Collection Time    08/16/13  1:55 PM      Result Value Ref Range Status   Specimen Description BLOOD LEFT ARM   Final   Special Requests BOTTLES DRAWN AEROBIC AND ANAEROBIC 10CC   Final   Culture  Setup Time     Final   Value: 08/16/2013 23:31     Performed at [facility]   Culture     Final   Value: NO GROWTH 5 DAYS     Performed at [facility]   Report Status 08/22/2013 FINAL   Final     Studies:  Recent x-ray studies have been reviewed in detail by the Attending Physician  Scheduled Meds:  Scheduled Meds: . amLODipine  10 mg Oral Daily  . aspirin EC  81 mg Oral Daily  . dextromethorphan-guaiFENesin  1 tablet Oral BID  . docusate sodium  100 mg Oral BID  . enoxaparin (LOVENOX) injection  40 mg Subcutaneous Q24H  . feeding supplement (ENSURE COMPLETE)  237 mL Oral BID BM  . fluconazole  100 mg Oral Daily  . insulin aspart  0-15 Units Subcutaneous TID WC  . insulin aspart  0-5 Units Subcutaneous QHS  . insulin glargine  5 Units Subcutaneous QHS  . irbesartan  300 mg Oral QHS  . latanoprost  1 drop Both Eyes QHS  . multivitamin with minerals  1 tablet Oral Daily  . nystatin  5 mL Oral QID  . piperacillin-tazobactam (ZOSYN)  IV  3.375 g Intravenous 3 times per day  . polyethylene glycol  17 g Oral TID  . predniSONE  5 mg Oral Q breakfast  . tamsulosin  0.4 mg Oral BID   Time spent on care of this patient: 25 min  Sheila Oats, MD [place]  [phone] Pager - Text Page per Shea Evans  as per below:  On-Call/Text Page:      [email or web address]      password [email or web address]  If 7PM-7AM, please contact night-coverage [email or web address] Password [email or web address] 08/25/2013, 6:45 PM   LOS: 11 days

## 2013-08-25 NOTE — Progress Notes (Signed)
Patient ID: Mike Mack, male   DOB: 08/05/1935, 78 y.o.   MRN: [id number]         [facility]    Date of Admission:  08/14/2013   Total days of antibiotics 12        Day 8 ampicillin        Day 7 fluconazole         Principal Problem:   Severe sepsis Active Problems:   Diabetes mellitus without complication   Hypertension   Stroke   Anemia   Right upper quadrant abdominal abscess   Hepatic abscess   . amLODipine  10 mg Oral Daily  . ampicillin (OMNIPEN) IV  2 g Intravenous 4 times per day  . aspirin EC  81 mg Oral Daily  . dextromethorphan-guaiFENesin  1 tablet Oral BID  . docusate sodium  100 mg Oral BID  . enoxaparin (LOVENOX) injection  40 mg Subcutaneous Q24H  . feeding supplement (ENSURE COMPLETE)  237 mL Oral BID BM  . fluconazole  100 mg Oral Daily  . insulin aspart  0-15 Units Subcutaneous TID WC  . insulin aspart  0-5 Units Subcutaneous QHS  . insulin glargine  5 Units Subcutaneous QHS  . irbesartan  300 mg Oral QHS  . latanoprost  1 drop Both Eyes QHS  . multivitamin with minerals  1 tablet Oral Daily  . nystatin  5 mL Oral QID  . polyethylene glycol  17 g Oral TID  . predniSONE  5 mg Oral Q breakfast  . tamsulosin  0.4 mg Oral BID    Subjective: He says he still having some mild right upper quadrant pain.  Objective: Temp:  [98.4 F (36.9 C)-98.6 F (37 C)] 98.4 F (36.9 C) (03/09 0451) Pulse Rate:  [77-89] 77 (03/09 0451) Resp:  [16-18] 17 (03/09 0451) BP: (114-122)/(54-62) 115/54 mmHg (03/09 0451) SpO2:  [97 %-99 %] 99 % (03/09 0451) Weight:  [59.5 kg (131 lb 2.8 oz)] 59.5 kg (131 lb 2.8 oz) (03/09 0451)  General: Sleeping but arouses easily. He is in no distress Lungs: Clear Cor: Regular S1-S2 no murmurs Abdomen: Thin bloody fluid in right upper quadrant draining. Mild tenderness in right upper quadrant with palpation   Lab Results Lab Results  Component Value Date   WBC 8.9 08/25/2013   HGB 7.0* 08/25/2013   HCT 21.4* 08/25/2013   MCV 80.1 08/25/2013   PLT 499* 08/25/2013    Lab Results  Component Value Date   CREATININE 0.75 08/21/2013   BUN 6 08/21/2013   NA 136* 08/21/2013   K 3.3* 08/21/2013   CL 99 08/21/2013   CO2 25 08/21/2013    Lab Results  Component Value Date   ALT 36 08/21/2013   AST 29 08/21/2013   ALKPHOS 186* 08/21/2013   BILITOT 0.2* 08/21/2013      Microbiology: Recent Results (from the past 240 hour(s))  CULTURE, ROUTINE-ABSCESS     Status: None   Collection Time    08/15/13  6:17 PM      Result Value Ref Range Status   Specimen Description ABSCESS GALL BLADDER   Final   Special Requests RUQ IN THE GB FOSSA,SPECIMEN NO 1   Final   Gram Stain     Final   Value: FEW WBC PRESENT,BOTH PMN AND MONONUCLEAR     NO SQUAMOUS EPITHELIAL CELLS SEEN     RARE GRAM POSITIVE COCCI IN PAIRS     RARE GRAM POSITIVE RODS  Performed at [facility]     Final   Value: MULTIPLE ORGANISMS PRESENT, NONE PREDOMINANT     Note: NO STAPHYLOCOCCUS AUREUS ISOLATED NO GROUP A STREP (S.PYOGENES) ISOLATED     Performed at [facility]   Report Status 08/19/2013 FINAL   Final  CULTURE, ROUTINE-ABSCESS     Status: None   Collection Time    08/15/13  6:52 PM      Result Value Ref Range Status   Specimen Description ABSCESS   Final   Special Requests     Final   Value: MID EPIGASTRUM IN THE ANTERIOR PERI GASTRIC SAMPLE NO 2   Gram Stain     Final   Value: ABUNDANT WBC PRESENT, PREDOMINANTLY PMN     NO SQUAMOUS EPITHELIAL CELLS SEEN     ABUNDANT GRAM POSITIVE COCCI IN PAIRS     Performed at [facility]   Culture     Final   Value: ABUNDANT ENTEROCOCCUS SPECIES     Performed at [facility]   Report Status 08/19/2013 FINAL   Final   Organism ID, Bacteria ENTEROCOCCUS SPECIES   Final  CULTURE, BLOOD (ROUTINE X 2)     Status: None   Collection Time    08/16/13  1:50 PM      Result Value Ref Range Status   Specimen Description BLOOD LEFT ARM   Final   Special  Requests BOTTLES DRAWN AEROBIC AND ANAEROBIC 10CC   Final   Culture  Setup Time     Final   Value: 08/16/2013 23:31     Performed at [facility]   Culture     Final   Value: NO GROWTH 5 DAYS     Performed at [facility]   Report Status 08/22/2013 FINAL   Final  CULTURE, BLOOD (ROUTINE X 2)     Status: None   Collection Time    08/16/13  1:55 PM      Result Value Ref Range Status   Specimen Description BLOOD LEFT ARM   Final   Special Requests BOTTLES DRAWN AEROBIC AND ANAEROBIC 10CC   Final   Culture  Setup Time     Final   Value: 08/16/2013 23:31     Performed at [facility]   Culture     Final   Value: NO GROWTH 5 DAYS     Performed at [facility]   Report Status 08/22/2013 FINAL   Final   CT ABDOMEN AND PELVIS WITH CONTRAST 08/22/2013   IMPRESSION:  Near complete resolution of the large fluid collection in the  gallbladder fossa with drainage catheter in place. Only a small  amount of fluid and gas remain present anterior to the liver edge.  No measurable fluid remains in the subxiphoid region adjacent to the  midline drainage catheter.  Enlarging low-density area in the right hepatic lobe. Cannot exclude  enlarging hepatic abscess.  Moderate to large right pleural effusion and small left pleural  effusion. Compressive atelectasis in the lower lobes.   By: Rolm Baptise M.D.  On: 08/22/2013 15:32    Assessment: Mr. Swamy had the initial bacteremia with enterococcus and Morganella and Gram stain of intra-abdominal abscess fluid suggest a polymicrobial infection. His CT shows a question of an enlarging low-density area in the right lobe of the liver. I favor broadening his current antibiotic therapy to improve coverage for Morganella and anaerobes while continuing to cover for enterococcus and  yeast.  Plan: 1. Change ampicillin to piperacillin tazobactam 2. Continue fluconazole 3. I will followup on Wednesday, March 11  Michel Bickers, MD [facility] [phone] pager   [phone] cell 08/25/2013, 12:56 PM

## 2013-08-25 NOTE — Progress Notes (Signed)
Subjective: Pt feels ok. Eating regular diet. Denies much of any pain  Objective: Physical Exam: BP 115/54  Pulse 77  Temp(Src) 98.4 F (36.9 C) (Oral)  Resp 17  Ht 5\' 5"  (1.651 m)  Wt 131 lb 2.8 oz (59.5 kg)  BMI 21.83 kg/m2  SpO2 99% Remaining drain intact, serous output in bulb 31mL output documented past 24hrs, which is an increase from the past few days. I flushed drain with 5cc NS, return of cloudy reddish fluid with some debris.   Labs: CBC  Recent Labs  08/24/13 0457 08/25/13 0450  WBC 8.2 8.9  HGB 7.1* 7.0*  HCT 21.9* 21.4*  PLT 450* 499*    Assessment/Plan: S/p GB fossa/ epigastric abscess drains placed 2/27, CT 3/6 with improved fluid collections.  Output slightly increased from remaining drain. Flushed clear of debris today Favor leaving drain another 24hrs to monitor output. If decreases to <17mL today, will remove tomorrow.    LOS: 11 days    Ascencion Dike PA-C 08/25/2013 10:25 AM

## 2013-08-26 ENCOUNTER — Encounter (INDEPENDENT_AMBULATORY_CARE_PROVIDER_SITE_OTHER): Payer: Medicare Other | Admitting: General Surgery

## 2013-08-26 LAB — BASIC METABOLIC PANEL
BUN: 11 mg/dL (ref 6–23)
CO2: 29 mEq/L (ref 19–32)
Calcium: 8.3 mg/dL — ABNORMAL LOW (ref 8.4–10.5)
Chloride: 98 mEq/L (ref 96–112)
Creatinine, Ser: 0.95 mg/dL (ref 0.50–1.35)
GFR calc Af Amer: >90 mL/min (ref >90)
GFR calc non Af Amer: 79 mL/min — ABNORMAL LOW (ref >90)
Glucose, Bld: 123 mg/dL — ABNORMAL HIGH (ref 70–99)
Potassium: 4.2 mEq/L (ref 3.7–5.3)
Sodium: 136 mEq/L — ABNORMAL LOW (ref 137–147)

## 2013-08-26 LAB — CBC
HCT: 22 % — ABNORMAL LOW (ref 39.0–52.0)
Hemoglobin: 7.1 g/dL — ABNORMAL LOW (ref 13.0–17.0)
MCH: 26 pg (ref 26.0–34.0)
MCHC: 32.3 g/dL (ref 30.0–36.0)
MCV: 80.6 fL (ref 78.0–100.0)
Platelets: 527 10*3/uL — ABNORMAL HIGH (ref 150–400)
RBC: 2.73 MIL/uL — ABNORMAL LOW (ref 4.22–5.81)
RDW: 17.9 % — ABNORMAL HIGH (ref 11.5–15.5)
WBC: 8.6 10*3/uL (ref 4.0–10.5)

## 2013-08-26 LAB — GLUCOSE, CAPILLARY
GLUCOSE-CAPILLARY: 298 mg/dL — AB (ref 70–99)
Glucose-Capillary: 123 mg/dL — ABNORMAL HIGH (ref 70–99)
Glucose-Capillary: 201 mg/dL — ABNORMAL HIGH (ref 70–99)
Glucose-Capillary: 172 mg/dL — ABNORMAL HIGH (ref 70–99)

## 2013-08-26 MED ORDER — GLUCERNA SHAKE PO LIQD
237.0000 mL | ORAL | Status: DC
  Administered 2013-08-26 – 2013-08-31 (×5): 237 mL via ORAL

## 2013-08-26 MED ORDER — ENSURE PUDDING PO PUDG
1.0000 | ORAL | Status: DC
  Administered 2013-08-28 – 2013-08-31 (×2): 1 via ORAL

## 2013-08-26 MED ORDER — ENSURE COMPLETE PO LIQD
237.0000 mL | Freq: Two times a day (BID) | ORAL | Status: DC
  Administered 2013-08-27 – 2013-09-01 (×9): 237 mL via ORAL

## 2013-08-26 MED ORDER — GUAIFENESIN ER 600 MG PO TB12
600.0000 mg | ORAL_TABLET | Freq: Two times a day (BID) | ORAL | Status: DC
  Administered 2013-08-26 – 2013-08-27 (×3): 600 mg via ORAL
  Filled 2013-08-26 (×3): qty 1

## 2013-08-26 MED ORDER — HYDROCODONE-ACETAMINOPHEN 5-325 MG PO TABS
1.0000 | ORAL_TABLET | Freq: Four times a day (QID) | ORAL | Status: DC | PRN
  Administered 2013-08-28 – 2013-08-29 (×3): 2 via ORAL
  Administered 2013-08-31 – 2013-09-01 (×2): 1 via ORAL
  Administered 2013-09-05 – 2013-09-06 (×3): 2 via ORAL
  Filled 2013-08-26: qty 1
  Filled 2013-08-26 (×3): qty 2
  Filled 2013-08-26: qty 1
  Filled 2013-08-26 (×2): qty 2
  Filled 2013-08-26: qty 1
  Filled 2013-08-26: qty 2
  Filled 2013-08-26: qty 1

## 2013-08-26 NOTE — Progress Notes (Signed)
Subjective: Pt feels ok. Eating regular diet. Denies much of any pain  Objective: Physical Exam: BP 121/61  Pulse 69  Temp(Src) 98.3 F (36.8 C) (Oral)  Resp 17  Ht 5\' 5"  (1.651 m)  Wt 131 lb 13.4 oz (59.8 kg)  BMI 21.94 kg/m2  SpO2 100% Remaining drain intact, serous output in bulb Only 42mL output since yesterday am. Drain removed without difficulty   Labs: CBC  Recent Labs  08/25/13 0450 08/26/13 0537  WBC 8.9 8.6  HGB 7.0* 7.1*  HCT 21.4* 22.0*  PLT 499* 527*    Assessment/Plan: S/p GB fossa/ epigastric abscess drains placed 2/27, CT 3/6 with improved fluid collections.  Remaining drain removed. Other plans per Surgical and primary teams.    LOS: 12 days    Ascencion Dike PA-C 08/26/2013 10:08 AM

## 2013-08-26 NOTE — Progress Notes (Signed)
Occupational Therapy Treatment Patient Details Name: Alando Colleran MRN: [id number] DOB: Jul 26, 1935 Today's Date: 08/26/2013 Time: 1191-4782 OT Time Calculation (min): 22 min  OT Assessment / Plan / Recommendation  History of present illness pt has had lap chole surgery 07/25/13. Pt returned from SNF with n/v/fever and abd pain. Pt found on ct to have biloma arising from surgical bed; posterior Rt hepatic lobe abscess; L hepatic lobe abscess extending beneath abd wall.    OT comments  Pt. Initially hesitant to participate but did agree to eob.  Max a for bed mobility.  Unable to maintain un supported sitting and used lue support on bed rail entire time eob.  Engaged in self feeding of lunch with max encouragement.  Slow progress. Max a back to bed  Follow Up Recommendations  SNF;Supervision/Assistance - 24 hour           Equipment Recommendations  None recommended by OT             Progress towards OT Goals Progress towards OT goals: Progressing toward goals  Plan Discharge plan remains appropriate    Precautions / Restrictions Precautions Precautions: Fall Precaution Comments: JP drain. To be removed today   Pertinent Vitals/Pain No c/o per pt.    ADL  Eating/Feeding: Performed;Min guard Where Assessed - Eating/Feeding: Edge of bed ADL Comments: able to feed self with right hand and reach for beverage with right hand.  notable swaying of r ue when trying to locate object initiailly.  pt. would look to me for confirmation he had located the object but denied any visual deficits.  holding to bed rail with lue for support during all of self feeding.        OT Goals(current goals can now be found in the care plan section)    Visit Information  Last OT Received On: 08/26/13 History of Present Illness: pt has had lap chole surgery 07/25/13. Pt returned from SNF with n/v/fever and abd pain. Pt found on ct to have biloma arising from surgical bed; posterior Rt hepatic lobe abscess;  L hepatic lobe abscess extending beneath abd wall.     Subjective Data   "now watch out, that's the leg that got the polio in it" (referring to moving RLE)          Cognition  Cognition Arousal/Alertness: Awake/alert Behavior During Therapy: WFL for tasks assessed/performed Overall Cognitive Status: No family/caregiver present to determine baseline cognitive functioning    Mobility  Bed Mobility Overal bed mobility: Needs Assistance Bed Mobility: Sidelying to Sit;Supine to Sit;Sit to Supine;Sit to Sidelying Sidelying to sit: Max assist Supine to sit: Max assist Sit to supine: Max assist Sit to sidelying: Max assist General bed mobility comments: pt. held to bed rail with hob elevated to sitting position for entire session.  encouraged un supported sitting but he was unable to maintain without notable LOB Transfers Overall transfer level: Needs assistance              End of Session OT - End of Session Activity Tolerance: Patient tolerated treatment well Patient left: in bed;with call bell/phone within reach       Janice Coffin, COTA/L 08/26/2013, 4:34 PM

## 2013-08-26 NOTE — Progress Notes (Signed)
Physical Therapy Treatment Patient Details Name: Mike Mack MRN: [id number] DOB: 07/09/1935 Today's Date: 08/26/2013 Time: 2297-9892 PT Time Calculation (min): 17 min  PT Assessment / Plan / Recommendation  History of Present Illness pt has had lap chole surgery 07/25/13. Pt returned from SNF with n/v/fever and abd pain. Pt found on ct to have biloma arising from surgical bed; posterior Rt hepatic lobe abscess; L hepatic lobe abscess extending beneath abd wall.    PT Comments   Patient continues to be limited by overall deconditioning and weakness. Patient working on sitting forward and upright. No large gains with therapy at this time. Continue to recommend SNF for ongoing Physical Therapy.     Follow Up Recommendations  SNF     Does the patient have the potential to tolerate intense rehabilitation     Barriers to Discharge        Equipment Recommendations       Recommendations for Other Services    Frequency Min 2X/week   Progress towards PT Goals Progress towards PT goals: Progressing toward goals  Plan Current plan remains appropriate    Precautions / Restrictions Precautions Precautions: Fall Precaution Comments: JP drain. To be removed today   Pertinent Vitals/Pain no apparent distress     Mobility  Bed Mobility Overal bed mobility: Needs Assistance Supine to sit: Total assist;+2 for physical assistance;HOB elevated General bed mobility comments: A for trunk elevation and to bring hips to EOB. pt able to move LEs to EOB and use L UE to assist.  Transfers Overall transfer level: Needs assistance Equipment used: 2 person hand held assist Sit to Stand: Max assist;+2 physical assistance Stand pivot transfers: Total assist;+2 physical assistance General transfer comment: pt unable to step with R LE and required maxA to advance LE. Patient having difficulting standing fully upright. Assist with use of chuck pad to transfer to recliner.     Exercises     PT  Diagnosis:    PT Problem List:   PT Treatment Interventions:     PT Goals (current goals can now be found in the care plan section)    Visit Information  Last PT Received On: 08/26/13 Assistance Needed: +2 History of Present Illness: pt has had lap chole surgery 07/25/13. Pt returned from SNF with n/v/fever and abd pain. Pt found on ct to have biloma arising from surgical bed; posterior Rt hepatic lobe abscess; L hepatic lobe abscess extending beneath abd wall.     Subjective Data      Cognition  Cognition Arousal/Alertness: Awake/alert Behavior During Therapy: WFL for tasks assessed/performed Overall Cognitive Status: No family/caregiver present to determine baseline cognitive functioning    Balance  Balance Sitting balance-Leahy Scale: Poor Sitting balance - Comments: pt unable to maintain upright position, pt with significant R lateral and posterior lean.Patient able to pull self into sitting while in recliner with use of armrest.  Standing balance-Leahy Scale: Zero  End of Session PT - End of Session Equipment Utilized During Treatment: Gait belt Activity Tolerance: Patient limited by fatigue Patient left: in chair;with call bell/phone within reach;with nursing/sitter in room Nurse Communication: Mobility status   GP     Jacqualyn Posey 08/26/2013, 8:55 AM 08/26/2013 Jacqualyn Posey PTA [phone] pager [phone] office

## 2013-08-26 NOTE — Progress Notes (Addendum)
NUTRITION FOLLOW-UP  INTERVENTION: Continue Ensure Complete to BID after meals, each supplement provides 350 kcal and 13 grams of protein. Add Ensure Pudding and Glucerna Shake once daily Provide Snacks BID Encourage PO intake Recommend considering an appetite stimulant  Continue MVI daily. RD to continue to follow nutrition care plan.  NUTRITION DIAGNOSIS: Increased nutrient needs related to wound healing as evidenced by estimated nutrition needs. Ongoing  Goal: Pt to meet >/= 90% of their estimated nutrition needs ; unmet  Monitor:  PO intake and supplement tolerance, weight, labs, I/O's  ASSESSMENT: 78 yo male with history of diabetes, HTN, HLD, prior stroke, recent laparoscopic cholecystectomy 07/25/2013 by [name], and discharged to [facility] 07/30/2013, now presented to [facility] ED with main concern of several days duration of persistent epigastric abdominal pain, throbbing and 5/10 in severity, non radiating, associated with fevers, chills, malaise, poor oral intake, no specific aggravating or alleviating factors.   Pt with severe sepsis s/p cholecystectomy; now s/p 2 perc drains.  Advanced to Dysphagia 1 diet (purees) 3/3 and dysphagia 2 diet 3/4. Per nursing notes pt has been eating 15-50% of most meals. Per order history pt has been accepting most Ensure supplements BID. Pt's lunch tray was in room, mostly untouched. Pt drinking Ensure Complete at time of visit; states he has no appetite. He reports drinking 1 or 2 Ensure shakes daily. Encouraged pt to eat more and discussed additional nutritional supplement options.    Height: Ht Readings from Last 1 Encounters:  08/16/13 5\' 5"  (1.651 m)    Weight: Wt Readings from Last 1 Encounters:  08/26/13 131 lb 13.4 oz (59.8 kg)  Admit wt 131 lb 08/24/13 126 lb  BMI:  Body mass index is 21.94 kg/(m^2). WNL  Estimated Nutritional Needs: Kcal: 1650-1850 Protein: 75-85 gm Fluid: 1.6-1.8 L  Skin: deep tissue injury to R heel  Diet  Order: Dysphagia 2    Intake/Output Summary (Last 24 hours) at 08/26/13 1402 Last data filed at 08/26/13 1133  Gross per 24 hour  Intake    610 ml  Output   2685 ml  Net  -2075 ml    Last BM: 3/7  Labs:   Recent Labs Lab 08/21/13 0557 08/26/13 0537  NA 136* 136*  K 3.3* 4.2  CL 99 98  CO2 25 29  BUN 6 11  CREATININE 0.75 0.95  CALCIUM 8.0* 8.3*  GLUCOSE 91 123*    CBG (last 3)   Recent Labs  08/25/13 2127 08/26/13 0752 08/26/13 1148  GLUCAP 179* 123* 201*    Scheduled Meds: . amLODipine  10 mg Oral Daily  . aspirin EC  81 mg Oral Daily  . dextromethorphan-guaiFENesin  1 tablet Oral BID  . docusate sodium  100 mg Oral BID  . enoxaparin (LOVENOX) injection  40 mg Subcutaneous Q24H  . feeding supplement (ENSURE COMPLETE)  237 mL Oral BID BM  . fluconazole  100 mg Oral Daily  . guaiFENesin  600 mg Oral BID  . insulin aspart  0-15 Units Subcutaneous TID WC  . insulin aspart  0-5 Units Subcutaneous QHS  . insulin glargine  5 Units Subcutaneous QHS  . irbesartan  300 mg Oral QHS  . latanoprost  1 drop Both Eyes QHS  . multivitamin with minerals  1 tablet Oral Daily  . nystatin  5 mL Oral QID  . piperacillin-tazobactam (ZOSYN)  IV  3.375 g Intravenous 3 times per day  . polyethylene glycol  17 g Oral TID  . predniSONE  5 mg Oral Q breakfast  . tamsulosin  0.4 mg Oral BID    Continuous Infusions:    Pryor Ochoa RD, LDN Inpatient Clinical Dietitian Pager: [phone] After Hours Pager: [phone]

## 2013-08-26 NOTE — Progress Notes (Signed)
Progress Note    Mike Mack NWG:[id number] DOB: 1936-06-03 DOA: 08/14/2013 PCP: [name], MD  Brief narrative: 78 y.o. male who presented on 08/14/2013 with a hx of HTN, HLD, prior stroke, recent laparoscopic cholecystectomy 07/25/2013 by [name] (discharged to [facility] 07/30/2013) with several days duration of persistent epigastric abdominal pain associated with fevers, chills, and malaise.   In the ED CT abdomen was notable for abscess in the gallbladder bed, as well as peri-hepatic abscesses. Surgery was consulted and [facility] was asked to admit for further evaluation. Pt started on Vancomycin and Zosyn in ED.  Subjective: Complaining of pain with coughing this a.m. denies shortness of breath, cough nonproductive  Assessment/Plan:  Severe sepsis /  Enterococcus + ?Morganella bacteremia / subhepatic abscess /  epigastric abscess  - s/p lap cholecystectomy 07/25/2013 - now with 2 perc drains being managed by IR - bacteremia with enterococcus and ?morganella - ID has decided to focus on the Enterococcus (gram stain results not suggestive of active Morganella infection),- repeat cultures negative - ID had initially recommended ampicillin for 4-6 weeks and to follow for resolution but on followup 3/9 Dr. Megan Salon changed antibiotics to Zosyn due to the enlarging low-density area in right hepatic lobe abscess not excluded as discussed below- pt remains afebrile with no leukocytosis on this regimen  -PICC line placed 3/6 -Repeat CT scan of abdomen and pelvis 3/6 with near-complete resolution of the large fluid collection in the gallbladder fossa with drainage catheter in place. Only a small amount of fluid and gas remain present anterior edge of the liver. An enlarging low density area in the right hepatic lobe cannot exclude enlarging hepatic abscess>> for now no new drain placement recommended per IR -Appreciate surgery assistance drains to be removed-one 3/7and the second one removed on  followup per IR and today 3/10 -Surgery to follow for further recommendations -Appreciate ID input, continue Zosyn as discussed above with plan for 4-6 weeks total antibiotics Diabetes mellitus with neuropathy - ok control at present, follow   Hypertension -Continue current meds, follow.  H/o Stroke w/ R sided weakness - med rec does not mention ASA or Statin, but old records reveal pt was on ASA 325 QD in Nov 2014 - add low dose ASA today - hold statin until tolerating full regular diet and lipids rechecked as outpt  Anemia - follow - will need transfusion prn to keep Hgb 7.0 or > - anemia panel consistent with AOCD, likely due to smoldering infection - will avoid IV Fe at present due to recent bacteremia  -hgb remained stable at 7.1 today with no gross bleeding, follow and recheck.. Chronic steroid use - no need for stress dose steroids currently - unclear why pt is on chronic steroids (?arthritits) - suggest very slow wean to off as outpt if able   Thrush - cont diflucan - has sore throat as well which may be consistent with candida esophagitis - sx improving slowly   Dysphagia - likely due to above - follow with Diflucan   BPH - Flomax  Elevated INR - due to poor nutrition? - given 1 U FFP prior to procedure - f/u normal - recheck on Friday  Cough -Will place on mucolytics. He is afebrile with no leukocytosis. Code Status: FULL Family Communication: Wife at bedside Disposition Plan: SNF when medically ready - needs f/u CT/drain checks   Consultants: Gen Surg  IR ID  Procedures: 2/27 - abdom abscess drain x2 in IR  2/28 - TTE - mild  LVH - EF 55-60% - no WMA - grade 1 DD  Antibiotics: Ampicillin 3/02 >>3/9 Diflucan 3/01 >>  Zosyn 2/26 >> 3/02; restarted 3/9>> Vanc 2/26 >> 3/01  DVT prophylaxis: Lovenox  Objective: Filed Weights   08/24/13 1253 08/25/13 0451 08/26/13 0528  Weight: 57.244 kg (126 lb 3.2 oz) 59.5 kg (131 lb 2.8 oz) 59.8 kg (131 lb 13.4 oz)    Blood pressure 121/61, pulse 69, temperature 98.3 F (36.8 C), temperature source Oral, resp. rate 17, height 5\' 5"  (1.651 m), weight 59.8 kg (131 lb 13.4 oz), SpO2 100.00%.  Intake/Output Summary (Last 24 hours) at 08/26/13 1011 Last data filed at 08/26/13 0525  Gross per 24 hour  Intake    615 ml  Output   1760 ml  Net  -1145 ml   Exam: General: No acute respiratory distress, alert and appropriate Lungs: Decreased breath sounds at the bases, no crackles or wheezes Cardiovascular: Regular rate and rhythm without murmur gallop or rub  Abdomen: 1 drain on right side intact w/ no d/c or erythema at insertion sites, nondistended, soft, bowel sounds positive, no rebound, no ascites, no appreciable mass Extremities: No significant cyanosis, clubbing, or edema bilateral lower extremities  Data Reviewed: Basic Metabolic Panel:  Recent Labs Lab 08/21/13 0557 08/26/13 0537  NA 136* 136*  K 3.3* 4.2  CL 99 98  CO2 25 29  GLUCOSE 91 123*  BUN 6 11  CREATININE 0.75 0.95  CALCIUM 8.0* 8.3*   Liver Function Tests:  Recent Labs Lab 08/21/13 0557  AST 29  ALT 36  ALKPHOS 186*  BILITOT 0.2*  PROT 6.1  ALBUMIN 1.6*   CBC:  Recent Labs Lab 08/21/13 0557 08/24/13 0457 08/25/13 0450 08/26/13 0537  WBC 8.5 8.2 8.9 8.6  NEUTROABS  --  6.2  --   --   HGB 7.1* 7.1* 7.0* 7.1*  HCT 21.5* 21.9* 21.4* 22.0*  MCV 78.8 81.1 80.1 80.6  PLT 325 450* 499* 527*   Cardiac Enzymes: No results found for this basename: CKTOTAL, CKMB, CKMBINDEX, TROPONINI,  in the last 168 hours CBG:  Recent Labs Lab 08/25/13 0757 08/25/13 1155 08/25/13 1731 08/25/13 2127 08/26/13 0752  GLUCAP 119* 300* 260* 179* 123*    Recent Results (from the past 240 hour(s))  CULTURE, BLOOD (ROUTINE X 2)     Status: None   Collection Time    08/16/13  1:50 PM      Result Value Ref Range Status   Specimen Description BLOOD LEFT ARM   Final   Special Requests BOTTLES DRAWN AEROBIC AND ANAEROBIC 10CC    Final   Culture  Setup Time     Final   Value: 08/16/2013 23:31     Performed at [place]     Final   Value: NO GROWTH 5 DAYS     Performed at [facility]   Report Status 08/22/2013 FINAL   Final  CULTURE, BLOOD (ROUTINE X 2)     Status: None   Collection Time    08/16/13  1:55 PM      Result Value Ref Range Status   Specimen Description BLOOD LEFT ARM   Final   Special Requests BOTTLES DRAWN AEROBIC AND ANAEROBIC 10CC   Final   Culture  Setup Time     Final   Value: 08/16/2013 23:31     Performed at [facility]   Culture     Final   Value: NO GROWTH  5 DAYS     Performed at [facility]   Report Status 08/22/2013 FINAL   Final     Studies:  Recent x-ray studies have been reviewed in detail by the Attending Physician  Scheduled Meds:  Scheduled Meds: . amLODipine  10 mg Oral Daily  . aspirin EC  81 mg Oral Daily  . dextromethorphan-guaiFENesin  1 tablet Oral BID  . docusate sodium  100 mg Oral BID  . enoxaparin (LOVENOX) injection  40 mg Subcutaneous Q24H  . feeding supplement (ENSURE COMPLETE)  237 mL Oral BID BM  . fluconazole  100 mg Oral Daily  . insulin aspart  0-15 Units Subcutaneous TID WC  . insulin aspart  0-5 Units Subcutaneous QHS  . insulin glargine  5 Units Subcutaneous QHS  . irbesartan  300 mg Oral QHS  . latanoprost  1 drop Both Eyes QHS  . multivitamin with minerals  1 tablet Oral Daily  . nystatin  5 mL Oral QID  . piperacillin-tazobactam (ZOSYN)  IV  3.375 g Intravenous 3 times per day  . polyethylene glycol  17 g Oral TID  . predniSONE  5 mg Oral Q breakfast  . tamsulosin  0.4 mg Oral BID   Time spent on care of this patient: 25 min  Sheila Oats, MD [place]  [phone] Pager - Text Page per Shea Evans as per below:  On-Call/Text Page:      [email or web address]      password [email or web address]  If 7PM-7AM, please contact night-coverage [email or web address] Password [email or web address] 08/26/2013, 10:11  AM   LOS: 12 days

## 2013-08-27 DIAGNOSIS — Z9089 Acquired absence of other organs: Secondary | ICD-10-CM

## 2013-08-27 LAB — CBC
HCT: 22.9 % — ABNORMAL LOW (ref 39.0–52.0)
HEMOGLOBIN: 7.3 g/dL — AB (ref 13.0–17.0)
MCH: 25.7 pg — ABNORMAL LOW (ref 26.0–34.0)
MCHC: 31.9 g/dL (ref 30.0–36.0)
MCV: 80.6 fL (ref 78.0–100.0)
Platelets: 576 10*3/uL — ABNORMAL HIGH (ref 150–400)
RBC: 2.84 MIL/uL — AB (ref 4.22–5.81)
RDW: 17.9 % — AB (ref 11.5–15.5)
WBC: 8.8 10*3/uL (ref 4.0–10.5)

## 2013-08-27 LAB — GLUCOSE, CAPILLARY
GLUCOSE-CAPILLARY: 212 mg/dL — AB (ref 70–99)
GLUCOSE-CAPILLARY: 229 mg/dL — AB (ref 70–99)
GLUCOSE-CAPILLARY: 281 mg/dL — AB (ref 70–99)
Glucose-Capillary: 406 mg/dL — ABNORMAL HIGH (ref 70–99)
Glucose-Capillary: 329 mg/dL — ABNORMAL HIGH (ref 70–99)

## 2013-08-27 MED ORDER — INSULIN ASPART 100 UNIT/ML ~~LOC~~ SOLN
0.0000 [IU] | Freq: Three times a day (TID) | SUBCUTANEOUS | Status: DC
  Administered 2013-08-27 – 2013-08-28 (×2): 15 [IU] via SUBCUTANEOUS
  Administered 2013-08-28: 4 [IU] via SUBCUTANEOUS
  Administered 2013-08-29: 3 [IU] via SUBCUTANEOUS
  Administered 2013-08-30: 4 [IU] via SUBCUTANEOUS
  Administered 2013-08-30 (×2): 20 [IU] via SUBCUTANEOUS
  Administered 2013-08-31 (×2): 11 [IU] via SUBCUTANEOUS
  Administered 2013-08-31: 4 [IU] via SUBCUTANEOUS
  Administered 2013-09-01: 7 [IU] via SUBCUTANEOUS
  Administered 2013-09-01: 4 [IU] via SUBCUTANEOUS
  Administered 2013-09-01: 11 [IU] via SUBCUTANEOUS
  Administered 2013-09-02: 7 [IU] via SUBCUTANEOUS
  Administered 2013-09-02: 11 [IU] via SUBCUTANEOUS
  Administered 2013-09-02: 3 [IU] via SUBCUTANEOUS
  Administered 2013-09-03 (×2): 4 [IU] via SUBCUTANEOUS
  Administered 2013-09-03: 7 [IU] via SUBCUTANEOUS
  Administered 2013-09-04: 4 [IU] via SUBCUTANEOUS
  Administered 2013-09-04: 3 [IU] via SUBCUTANEOUS
  Administered 2013-09-04 – 2013-09-05 (×2): 4 [IU] via SUBCUTANEOUS
  Administered 2013-09-05: 11 [IU] via SUBCUTANEOUS
  Administered 2013-09-05: 7 [IU] via SUBCUTANEOUS
  Administered 2013-09-06: 3 [IU] via SUBCUTANEOUS
  Administered 2013-09-06: 4 [IU] via SUBCUTANEOUS

## 2013-08-27 MED ORDER — INSULIN GLARGINE 100 UNIT/ML ~~LOC~~ SOLN
10.0000 [IU] | Freq: Every day | SUBCUTANEOUS | Status: DC
  Administered 2013-08-28 – 2013-09-05 (×10): 10 [IU] via SUBCUTANEOUS
  Filled 2013-08-27 (×13): qty 0.1

## 2013-08-27 MED ORDER — INSULIN ASPART 100 UNIT/ML ~~LOC~~ SOLN: 0.0000 [IU] | Freq: Every day | SUBCUTANEOUS | Status: DC

## 2013-08-27 NOTE — Progress Notes (Signed)
ANTIBIOTIC CONSULT NOTE - FOLLOW UP  Pharmacy Consult for Zosyn Indication: Intra-abdominal infection  No Known Allergies  Patient Measurements: Height: _0  (165.1 cm) Weight: 126 lb 15.3 oz (57.586 kg) IBW/kg (Calculated) : 61.5 Adjusted Body Weight:    Vital Signs: Temp: 98.2 F (36.8 C) (03/11 0641) BP: 124/57 mmHg (03/11 0641) Pulse Rate: 83 (03/11 0641) Intake/Output from previous day: 03/10 0701 - 03/11 0700 In: 774 [P.O.:474; IV Piggyback:300] Out: 3701 [Urine:3700; Stool:1] Intake/Output from this shift: Total I/O In: 120 [P.O.:120] Out: -   Labs:  Recent Labs  08/25/13 0450 08/26/13 0537 08/27/13 0435  WBC 8.9 8.6 8.8  HGB 7.0* 7.1* 7.3*  PLT 499* 527* 576*  CREATININE  --  0.95  --    Estimated Creatinine Clearance: 54.7 ml/min (by C-G formula based on Cr of 0.95). No results found for this basename: VANCOTROUGH, Corlis Leak, VANCORANDOM, [place], GENTPEAK, GENTRANDOM, TOBRATROUGH, TOBRAPEAK, TOBRARND, AMIKACINPEAK, AMIKACINTROU, AMIKACIN,  in the last 72 hours   Microbiology: Recent Results (from the past 720 hour(s))  URINE CULTURE     Status: None   Collection Time    07/28/13  3:22 PM      Result Value Ref Range Status   Specimen Description URINE, CLEAN CATCH   Final   Special Requests NONE   Final   Culture  Setup Time     Final   Value: 07/28/2013 20:55     Performed at [place]     Final   Value: NO GROWTH     Performed at [facility]   Culture     Final   Value: NO GROWTH     Performed at [facility]   Report Status 07/29/2013 FINAL   Final  URINE CULTURE     Status: None   Collection Time    08/14/13 12:17 AM      Result Value Ref Range Status   Specimen Description URINE, CATHETERIZED   Final   Special Requests NONE   Final   Culture  Setup Time     Final   Value: 08/15/2013 03:29     Performed at [place]     Final   Value: NO GROWTH     Performed at  [facility]   Culture     Final   Value: NO GROWTH     Performed at [facility]   Report Status 08/16/2013 FINAL   Final  CULTURE, BLOOD (ROUTINE X 2)     Status: None   Collection Time    08/14/13  2:38 PM      Result Value Ref Range Status   Specimen Description BLOOD ARM LEFT   Final   Special Requests BOTTLES DRAWN AEROBIC AND ANAEROBIC 5CC   Final   Culture  Setup Time     Final   Value: 08/14/2013 20:43     Performed at [facility]   Culture     Final   Value: ENTEROCOCCUS SPECIES     Note: COMBINATION THERAPY OF HIGH DOSE AMPICILLIN OR VANCOMYCIN, PLUS AN AMINOGLYCOSIDE, IS USUALLY INDICATED FOR SERIOUS ENTEROCOCCAL INFECTIONS.     MORGANELLA MORGANII     Note: Gram Stain Report Called to,Read Back By and Verified With: MORGAN SCHMITZ 08/15/12 0815 BY SMITHERSJ CRITICAL RESULT CALLED TO, READ BACK BY AND VERIFIED WITH: ROSEMARY CLARK 2/29/15 @ 10:46AM BY RUSCOE A.     Performed at [facility]   Report Status 08/18/2013 FINAL   Final   Organism ID, Bacteria ENTEROCOCCUS SPECIES   Final   Organism ID, Bacteria MORGANELLA MORGANII   Final  CULTURE, BLOOD (ROUTINE X 2)     Status: None   Collection Time    08/14/13  2:52 PM      Result Value Ref Range Status   Specimen Description BLOOD RIGHT FOREARM   Final   Special Requests BOTTLES DRAWN AEROBIC AND ANAEROBIC 5CC   Final   Culture  Setup Time     Final   Value: 08/14/2013 20:43     Performed at [facility]   Culture     Final   Value: ENTEROCOCCUS SPECIES     Note: SUSCEPTIBILITIES PERFORMED ON PREVIOUS CULTURE WITHIN THE LAST 5 DAYS.     Note: Gram Stain Report Called to,Read Back By and Verified With: Drema Dallas 08/15/13 0815 BY SMITHERSJ     Performed at [facility]   Report Status 08/17/2013 FINAL   Final  RAPID STREP SCREEN     Status: None   Collection Time    08/15/13 11:42 AM      Result Value Ref Range Status   Streptococcus, Group A Screen (Direct)  NEGATIVE  NEGATIVE Final   Comment: (NOTE)     A Rapid Antigen test may result negative if the antigen level in the     sample is below the detection level of this test. The FDA has not     cleared this test as a stand-alone test therefore the rapid antigen     negative result has reflexed to a Group A Strep culture.  CULTURE, GROUP A STREP     Status: None   Collection Time    08/15/13 11:42 AM      Result Value Ref Range Status   Specimen Description THROAT   Final   Special Requests NONE   Final   Culture     Final   Value: No Beta Hemolytic Streptococci Isolated     Performed at [facility]   Report Status 08/17/2013 FINAL   Final  CULTURE, ROUTINE-ABSCESS     Status: None   Collection Time    08/15/13  6:17 PM      Result Value Ref Range Status   Specimen Description ABSCESS GALL BLADDER   Final   Special Requests RUQ IN THE GB FOSSA,SPECIMEN NO 1   Final   Gram Stain     Final   Value: FEW WBC PRESENT,BOTH PMN AND MONONUCLEAR     NO SQUAMOUS EPITHELIAL CELLS SEEN     RARE GRAM POSITIVE COCCI IN PAIRS     RARE GRAM POSITIVE RODS     Performed at [facility]   Culture     Final   Value: MULTIPLE ORGANISMS PRESENT, NONE PREDOMINANT     Note: NO STAPHYLOCOCCUS AUREUS ISOLATED NO GROUP A STREP (S.PYOGENES) ISOLATED     Performed at [facility]   Report Status 08/19/2013 FINAL   Final  CULTURE, ROUTINE-ABSCESS     Status: None   Collection Time    08/15/13  6:52 PM      Result Value Ref Range Status   Specimen Description ABSCESS   Final   Special Requests     Final   Value: MID EPIGASTRUM IN THE ANTERIOR PERI GASTRIC SAMPLE NO 2   Gram Stain     Final   Value: ABUNDANT WBC PRESENT,  PREDOMINANTLY PMN     NO SQUAMOUS EPITHELIAL CELLS SEEN     ABUNDANT GRAM POSITIVE COCCI IN PAIRS     Performed at [facility]   Culture     Final   Value: ABUNDANT ENTEROCOCCUS SPECIES     Performed at [facility]   Report Status 08/19/2013  FINAL   Final   Organism ID, Bacteria ENTEROCOCCUS SPECIES   Final  CULTURE, BLOOD (ROUTINE X 2)     Status: None   Collection Time    08/16/13  1:50 PM      Result Value Ref Range Status   Specimen Description BLOOD LEFT ARM   Final   Special Requests BOTTLES DRAWN AEROBIC AND ANAEROBIC 10CC   Final   Culture  Setup Time     Final   Value: 08/16/2013 23:31     Performed at [facility]   Culture     Final   Value: NO GROWTH 5 DAYS     Performed at [facility]   Report Status 08/22/2013 FINAL   Final  CULTURE, BLOOD (ROUTINE X 2)     Status: None   Collection Time    08/16/13  1:55 PM      Result Value Ref Range Status   Specimen Description BLOOD LEFT ARM   Final   Special Requests BOTTLES DRAWN AEROBIC AND ANAEROBIC 10CC   Final   Culture  Setup Time     Final   Value: 08/16/2013 23:31     Performed at [facility]   Culture     Final   Value: NO GROWTH 5 DAYS     Performed at [facility]   Report Status 08/22/2013 FINAL   Final    Anti-infectives   Start     Dose/Rate Route Frequency Ordered Stop   08/25/13 1400  piperacillin-tazobactam (ZOSYN) IVPB 3.375 g     3.375 g 12.5 mL/hr over 240 Minutes Intravenous 3 times per day 08/25/13 1359     08/18/13 1645  fluconazole (DIFLUCAN) tablet 100 mg     100 mg Oral Daily 08/18/13 1641     08/18/13 1200  ampicillin (OMNIPEN) 2 g in sodium chloride 0.9 % 50 mL IVPB  Status:  Discontinued    Comments:  Pharmacy may adjust as needed   2 g 150 mL/hr over 20 Minutes Intravenous 4 times per day 08/18/13 1049 08/25/13 1300   08/17/13 1000  fluconazole (DIFLUCAN) IVPB 100 mg  Status:  Discontinued     100 mg 50 mL/hr over 60 Minutes Intravenous Every 24 hours 08/17/13 0936 08/18/13 1641   08/16/13 1100  vancomycin (VANCOCIN) 500 mg in sodium chloride 0.9 % 100 mL IVPB  Status:  Discontinued     500 mg 100 mL/hr over 60 Minutes Intravenous Every 12 hours 08/16/13 1016 08/18/13 1049   08/15/13 1200   vancomycin (VANCOCIN) 1,250 mg in sodium chloride 0.9 % 250 mL IVPB  Status:  Discontinued     1,250 mg 166.7 mL/hr over 90 Minutes Intravenous Every 24 hours 08/14/13 1911 08/16/13 1016   08/15/13 0000  piperacillin-tazobactam (ZOSYN) IVPB 3.375 g  Status:  Discontinued     3.375 g 12.5 mL/hr over 240 Minutes Intravenous Every 8 hours 08/14/13 1911 08/18/13 1049   08/14/13 1530  vancomycin (VANCOCIN) IVPB 1000 mg/200 mL premix     1,000 mg 200 mL/hr over 60 Minutes Intravenous  Once 08/14/13 1526 08/14/13 1823   08/14/13 1530  piperacillin-tazobactam (ZOSYN) IVPB 3.375 g     3.375 g 100 mL/hr over 30 Minutes Intravenous  Once 08/14/13 1526 08/14/13 66      Assessment:77 yo M who is from [facility] s/p recent cholecystectomy (2/6) presents with intra-abd abscess.   Anticoagulation: none PTA. Baseline INR 1.7 on admit. Lovenox 76m/day for prophylaxis. Hgb 7.3.  Infectious Disease: Resume Zosyn for intra-abdominal abscess. Initial bacteremia with enterococcus and Morganella and Gram stain of intra-abdominal abscess fluid suggest a polymicrobial infection. His CT shows a question of an enlarging low-density area in the right lobe of the liver. ID wants to broaden abx coverage for Morganella and anaerobes in addition to enterococcus and yeast. Fluconazole for candida esophagitis.  2/26 Vanc>>3/2 2/26 Zosyn>>3/2, resume 3/9>> 3/2 Fluconazole>>  2/26 Bld x2>> 2/2 Enterococcus (sens amp, gent, vanc), Mirganella M. 2/26 Urine>> neg 2/27 GAS neg 2/27 Abcess (mid epigastrum)>> gm stain w/ abundant GPC pairs 2/27 Abcess (gallbladder) >> gm stain w/ rare GPC pairs, GPR 2/28: BC x 2: negative  Cardiovascular: HTN. TTE - EF 55-60%, VSS on ASA81, Norvasc10, Avapro  Endocrinology: DM. CBGs 123-201. SSI and lantus. Pred 555mdaily (on PTA). A1c 7.3.   Gastrointestinal / Nutrition: NPO. LFTs ok except for alk phos 186. All drains out. Swallowing improved.  Neurology: Neuropathy, CVA, hx  polio  Nephrology: SCr stable 0.95, BPH - tamsulosin.  Pulmonary: RA. Mucinex DM,  Hematology / Oncology: Hgb down to 7.3  PTA Medication Issues: Not resumed: gabapentin  Best Practices: Enox    Plan:  Zosyn 3.375g IV q8hr. Pharmacy will sign off. No further adjustment required unless CrCl<20. Please reconsult for further dosing assistance.   Kimberlyn Quiocho S. RoAlford HighlandPharmD, BCPS Clinical Staff Pharmacist Pager 31[place]/04/2014,11:14 AM

## 2013-08-27 NOTE — Progress Notes (Signed)
Pt's CBG - 406.  Stat lab entered and MD notified.  Mike Mack

## 2013-08-27 NOTE — Progress Notes (Signed)
Patient ID: Mike Mack, male   DOB: 1935-06-20, 78 y.o.   MRN: [id number]         [place] [facility]    Date of Admission:  08/14/2013   Total days of antibiotics 14        Day 2 piperacillin tazobactam                 Day 9 fluconazole         Principal Problem:   Severe sepsis Active Problems:   Diabetes mellitus without complication   Hypertension   Stroke   Anemia   Right upper quadrant abdominal abscess   Hepatic abscess   . amLODipine  10 mg Oral Daily  . aspirin EC  81 mg Oral Daily  . enoxaparin (LOVENOX) injection  40 mg Subcutaneous Q24H  . feeding supplement (ENSURE COMPLETE)  237 mL Oral BID PC  . feeding supplement (ENSURE)  1 Container Oral Q24H  . feeding supplement (GLUCERNA SHAKE)  237 mL Oral Q24H  . fluconazole  100 mg Oral Daily  . insulin aspart  0-20 Units Subcutaneous TID WC  . insulin aspart  0-5 Units Subcutaneous QHS  . insulin glargine  10 Units Subcutaneous QHS  . irbesartan  300 mg Oral QHS  . latanoprost  1 drop Both Eyes QHS  . multivitamin with minerals  1 tablet Oral Daily  . nystatin  5 mL Oral QID  . piperacillin-tazobactam (ZOSYN)  IV  3.375 g Intravenous 3 times per day  . polyethylene glycol  17 g Oral TID  . predniSONE  5 mg Oral Q breakfast  . tamsulosin  0.4 mg Oral BID    Objective: Temp:  [98.2 F (36.8 C)-98.4 F (36.9 C)] 98.4 F (36.9 C) (03/11 1419) Pulse Rate:  [70-83] 77 (03/11 1419) Resp:  [17-18] 18 (03/11 1419) BP: (100-124)/(49-58) 104/49 mmHg (03/11 1419) SpO2:  [96 %-100 %] 99 % (03/11 1419) Weight:  [57.586 kg (126 lb 15.3 oz)] 57.586 kg (126 lb 15.3 oz) (03/11 0641)  All drains have been removed  Lab Results Lab Results  Component Value Date   WBC 8.8 08/27/2013   HGB 7.3* 08/27/2013   HCT 22.9* 08/27/2013   MCV 80.6 08/27/2013   PLT 576* 08/27/2013    Lab Results  Component Value Date   CREATININE 0.95 08/26/2013   BUN 11 08/26/2013   NA 136* 08/26/2013   K 4.2 08/26/2013   CL  98 08/26/2013   CO2 29 08/26/2013    Lab Results  Component Value Date   ALT 36 08/21/2013   AST 29 08/21/2013   ALKPHOS 186* 08/21/2013   BILITOT 0.2* 08/21/2013      Microbiology: No results found for this or any previous visit (from the past 240 hour(s)). CT ABDOMEN AND PELVIS WITH CONTRAST 08/22/2013   IMPRESSION:  Near complete resolution of the large fluid collection in the  gallbladder fossa with drainage catheter in place. Only a small  amount of fluid and gas remain present anterior to the liver edge.  No measurable fluid remains in the subxiphoid region adjacent to the  midline drainage catheter.  Enlarging low-density area in the right hepatic lobe. Cannot exclude  enlarging hepatic abscess.  Moderate to large right pleural effusion and small left pleural  effusion. Compressive atelectasis in the lower lobes.   By: Rolm Baptise M.D.  On: 08/22/2013 15:32    Assessment: Mr. Munger had the initial bacteremia with enterococcus and Morganella and Gram  stain of intra-abdominal abscess fluid suggested a polymicrobic infection. His CT shows a question of an enlarging low-density area in the right lobe of the liver. I will continue his current antimicrobial therapy pending repeat CT scan tomorrow.  Plan: 1. Continue piperacillin tazobactam and fluconazole 2. Repeat CT scan tomorrow  Michel Bickers, MD [place] [facility] [place] [phone] pager   [phone] cell 08/27/2013, 5:48 PM

## 2013-08-27 NOTE — Progress Notes (Signed)
Inpatient Diabetes Program Recommendations  AACE/ADA: New Consensus Statement on Inpatient Glycemic Control (2013)  Target Ranges:  Prepandial:   less than 140 mg/dL      Peak postprandial:   less than 180 mg/dL (1-2 hours)      Critically ill patients:  140 - 180 mg/dL     Results for NATANIEL, GASPER (MRN [id number]) as of 08/27/2013 10:45  Ref. Range 08/26/2013 07:52 08/26/2013 11:48 08/26/2013 16:55 08/26/2013 22:15  Glucose-Capillary Latest Range: 70-99 mg/dL 123 (H) 201 (H) 298 (H) 172 (H)    Results for AUGUSTA, HILBERT (MRN [id number]) as of 08/27/2013 10:45  Ref. Range 08/27/2013 08:53  Glucose-Capillary Latest Range: 70-99 mg/dL 212 (H)     **Patient having some glucose elevations.  PO intake poor this morning.   **MD- Please consider changing Novolog Moderate SSi to Q4 hours coverage    Will follow. Wyn Quaker RN, MSN, CDE Diabetes Coordinator Inpatient Diabetes Program Team Pager: [phone] (8a-10p)

## 2013-08-27 NOTE — Progress Notes (Addendum)
Subjective: All drains are out.  Says that he is swallowing better.  Denies abdominal pain. Remains on Diflucan, Zosyn, Mycostatin.  Objective: Vital signs in last 24 hours: Temp:  [98.3 F (36.8 C)-98.6 F (37 C)] 98.3 F (36.8 C) (03/10 2208) Pulse Rate:  [70-81] 70 (03/10 2208) Resp:  [17-19] 17 (03/10 2208) BP: (100-116)/(56-58) 100/58 mmHg (03/10 2208) SpO2:  [96 %-100 %] 96 % (03/10 2208) Last BM Date: 08/23/13  Intake/Output from previous day: 03/10 0701 - 03/11 0700 In: 200 [IV Piggyback:200] Out: 1501 [Urine:1500; Stool:1] Intake/Output this shift: Total I/O In: -  Out: 275 [Urine:275]  General appearance: alert in no distress. Cooperative. Afebrile. Feeble and deconditioned. GI: abdomen soft. Nontender. Nondistended. No mass.  Lab Results:   Recent Labs  08/26/13 0537 08/27/13 0435  WBC 8.6 8.8  HGB 7.1* 7.3*  HCT 22.0* 22.9*  PLT 527* 576*   BMET  Recent Labs  08/26/13 0537  NA 136*  K 4.2  CL 98  CO2 29  GLUCOSE 123*  BUN 11  CREATININE 0.95  CALCIUM 8.3*   PT/INR No results found for this basename: LABPROT, INR,  in the last 72 hours ABG No results found for this basename: PHART, PCO2, PO2, HCO3,  in the last 72 hours  Studies/Results: No results found.  Anti-infectives: Anti-infectives   Start     Dose/Rate Route Frequency Ordered Stop   08/25/13 1400  piperacillin-tazobactam (ZOSYN) IVPB 3.375 g     3.375 g 12.5 mL/hr over 240 Minutes Intravenous 3 times per day 08/25/13 1359     08/18/13 1645  fluconazole (DIFLUCAN) tablet 100 mg     100 mg Oral Daily 08/18/13 1641     08/18/13 1200  ampicillin (OMNIPEN) 2 g in sodium chloride 0.9 % 50 mL IVPB  Status:  Discontinued    Comments:  Pharmacy may adjust as needed   2 g 150 mL/hr over 20 Minutes Intravenous 4 times per day 08/18/13 1049 08/25/13 1300   08/17/13 1000  fluconazole (DIFLUCAN) IVPB 100 mg  Status:  Discontinued     100 mg 50 mL/hr over 60 Minutes Intravenous  Every 24 hours 08/17/13 0936 08/18/13 1641   08/16/13 1100  vancomycin (VANCOCIN) 500 mg in sodium chloride 0.9 % 100 mL IVPB  Status:  Discontinued     500 mg 100 mL/hr over 60 Minutes Intravenous Every 12 hours 08/16/13 1016 08/18/13 1049   08/15/13 1200  vancomycin (VANCOCIN) 1,250 mg in sodium chloride 0.9 % 250 mL IVPB  Status:  Discontinued     1,250 mg 166.7 mL/hr over 90 Minutes Intravenous Every 24 hours 08/14/13 1911 08/16/13 1016   08/15/13 0000  piperacillin-tazobactam (ZOSYN) IVPB 3.375 g  Status:  Discontinued     3.375 g 12.5 mL/hr over 240 Minutes Intravenous Every 8 hours 08/14/13 1911 08/18/13 1049   08/14/13 1530  vancomycin (VANCOCIN) IVPB 1000 mg/200 mL premix     1,000 mg 200 mL/hr over 60 Minutes Intravenous  Once 08/14/13 1526 08/14/13 1823   08/14/13 1530  piperacillin-tazobactam (ZOSYN) IVPB 3.375 g     3.375 g 100 mL/hr over 30 Minutes Intravenous  Once 08/14/13 1526 08/14/13 1603      Assessment/Plan:  1. Laparoscopic cholecystectomy - 07/25/2013 - H. Marijose Curington   2. Gall bladder fossa abscess and questionable intraparenchymal liver abscess  Drainage 08/15/2013 - drains out and extrahepatic fluid collections resolved. Enterococcus in blood  On diflucan/zosyn- ID following  CT - 08/22/2013 -" Near complete resolution of  the large fluid collection in the  gallbladder fossa with drainage catheter in place. Only a small amount of fluid and gas remain present anterior to the liver edge. No measurable fluid remains in the subxiphoid region adjacent to the midline drainage catheter. Enlarging low-density area in the right hepatic lobe. Cannot exclude enlarging hepatic abscess. Moderate to large right pleural effusion and small left pleural effusion. Compressive atelectasis in the lower lobes. " WBC - 8,800 - 08/27/2013 - po prednisone  Rec CT Thurs.   -  Reassess low-density area right hepatic lobe. We need to decide whether to treat with antibiotics alone or aspirate /  drain.   3. Thrush  On mycostatin/diflucan. swallowing  better.   4. History of polio  5. IDDM  6. Arthritis - on chronic prednisone  7. DVT prophylaxis - Lovenox  8. Right pleural effusion  9. Anemia          Hgb - 7.3 - 08/27/2013 - stable     LOS: 13 days    Karaline Buresh M 08/27/2013

## 2013-08-27 NOTE — Progress Notes (Signed)
Chart reviewed.   Progress Note    Mike Mack ZOX:[id number] DOB: 1935-10-11 DOA: 08/14/2013 PCP: [name], MD  Brief narrative: 78 y.o. male who presented on 08/14/2013 with a hx of HTN, HLD, prior stroke, recent laparoscopic cholecystectomy 07/25/2013 by [name] (discharged to [facility] 07/30/2013) with several days duration of persistent epigastric abdominal pain associated with fevers, chills, and malaise.   In the ED CT abdomen was notable for abscess in the gallbladder bed, as well as peri-hepatic abscesses. Surgery was consulted and [facility] was asked to admit for further evaluation. Pt started on Vancomycin and Zosyn in ED.   Assessment/Plan:  Severe sepsis /  Enterococcus + ?Morganella bacteremia / subhepatic abscess /  epigastric abscess  Changed to zosyn due to concerns re: liver abscess. Repeat CT tomorrow per surgery  Diabetes mellitus with neuropathy Poorly controlled. Increase lantus and SSI to resistant  Hypertension -Continue current meds, follow.  H/o Stroke w/ R sided weakness On ASA  Anemia of chronic disease Monitor  Chronic steroid use - no need for stress dose steroids currently - unclear why pt is on chronic steroids (?arthritits) - suggest very slow wean to off as outpt if able   Thrush - cont diflucan - has sore throat as well which may be consistent with candida esophagitis - sx improving slowly   Dysphagia - likely due to above - follow with Diflucan   BPH - Flomax  Elevated INR - due to poor nutrition? - given 1 U FFP prior to procedure - f/u normal - recheck on Friday   Code Status: FULL Family Communication: Disposition Plan: SNF   Consultants: Gen Surg  IR ID  Procedures: 2/27 - abdom abscess drain x2 in IR  2/28 - TTE - mild LVH - EF 55-60% - no WMA - grade 1 DD  Antibiotics: Ampicillin 3/02 >>3/9 Diflucan 3/01 >>  Zosyn 2/26 >> 3/02; restarted 3/9>> Vanc 2/26 >> 3/01  DVT prophylaxis: Lovenox  Subjective: No  complaints Objective: Filed Weights   08/25/13 0451 08/26/13 0528 08/27/13 0641  Weight: 59.5 kg (131 lb 2.8 oz) 59.8 kg (131 lb 13.4 oz) 57.586 kg (126 lb 15.3 oz)   Blood pressure 104/49, pulse 77, temperature 98.4 F (36.9 C), temperature source Oral, resp. rate 18, height 5\' 5"  (1.651 m), weight 57.586 kg (126 lb 15.3 oz), SpO2 99.00%.  Intake/Output Summary (Last 24 hours) at 08/27/13 1452 Last data filed at 08/27/13 1420  Gross per 24 hour  Intake    694 ml  Output   3401 ml  Net  -2707 ml   Exam: General: No acute respiratory distress, alert and appropriate Lungs: Decreased breath sounds at the bases, no crackles or wheezes Cardiovascular: Regular rate and rhythm without murmur gallop or rub  Abdomen: dressings CDI. Soft, nontender, nondistended Extremities: No significant cyanosis, clubbing, or edema bilateral lower extremities  Data Reviewed: Basic Metabolic Panel:  Recent Labs Lab 08/21/13 0557 08/26/13 0537  NA 136* 136*  K 3.3* 4.2  CL 99 98  CO2 25 29  GLUCOSE 91 123*  BUN 6 11  CREATININE 0.75 0.95  CALCIUM 8.0* 8.3*   Liver Function Tests:  Recent Labs Lab 08/21/13 0557  AST 29  ALT 36  ALKPHOS 186*  BILITOT 0.2*  PROT 6.1  ALBUMIN 1.6*   CBC:  Recent Labs Lab 08/21/13 0557 08/24/13 0457 08/25/13 0450 08/26/13 0537 08/27/13 0435  WBC 8.5 8.2 8.9 8.6 8.8  NEUTROABS  --  6.2  --   --   --  HGB 7.1* 7.1* 7.0* 7.1* 7.3*  HCT 21.5* 21.9* 21.4* 22.0* 22.9*  MCV 78.8 81.1 80.1 80.6 80.6  PLT 325 450* 499* 527* 576*   Cardiac Enzymes: No results found for this basename: CKTOTAL, CKMB, CKMBINDEX, TROPONINI,  in the last 168 hours CBG:  Recent Labs Lab 08/26/13 0752 08/26/13 1148 08/26/13 1655 08/26/13 2215 08/27/13 0853  GLUCAP 123* 201* 298* 172* 212*    No results found for this or any previous visit (from the past 240 hour(s)).   Studies:  Recent x-ray studies have been reviewed in detail by the Attending  Physician  Scheduled Meds:  Scheduled Meds: . amLODipine  10 mg Oral Daily  . aspirin EC  81 mg Oral Daily  . dextromethorphan-guaiFENesin  1 tablet Oral BID  . docusate sodium  100 mg Oral BID  . enoxaparin (LOVENOX) injection  40 mg Subcutaneous Q24H  . feeding supplement (ENSURE COMPLETE)  237 mL Oral BID PC  . feeding supplement (ENSURE)  1 Container Oral Q24H  . feeding supplement (GLUCERNA SHAKE)  237 mL Oral Q24H  . fluconazole  100 mg Oral Daily  . guaiFENesin  600 mg Oral BID  . insulin aspart  0-15 Units Subcutaneous TID WC  . insulin aspart  0-5 Units Subcutaneous QHS  . insulin glargine  5 Units Subcutaneous QHS  . irbesartan  300 mg Oral QHS  . latanoprost  1 drop Both Eyes QHS  . multivitamin with minerals  1 tablet Oral Daily  . nystatin  5 mL Oral QID  . piperacillin-tazobactam (ZOSYN)  IV  3.375 g Intravenous 3 times per day  . polyethylene glycol  17 g Oral TID  . predniSONE  5 mg Oral Q breakfast  . tamsulosin  0.4 mg Oral BID   Time spent on care of this patient: 25 min  [name], MD [facility]  08/27/2013, 2:52 PM   LOS: 13 days

## 2013-08-28 ENCOUNTER — Inpatient Hospital Stay (HOSPITAL_COMMUNITY): Payer: Medicare Other

## 2013-08-28 LAB — GLUCOSE, CAPILLARY
GLUCOSE-CAPILLARY: 177 mg/dL — AB (ref 70–99)
Glucose-Capillary: 106 mg/dL — ABNORMAL HIGH (ref 70–99)
Glucose-Capillary: 324 mg/dL — ABNORMAL HIGH (ref 70–99)
Glucose-Capillary: 278 mg/dL — ABNORMAL HIGH (ref 70–99)

## 2013-08-28 MED ORDER — IRBESARTAN 150 MG PO TABS
150.0000 mg | ORAL_TABLET | Freq: Every day | ORAL | Status: DC
  Administered 2013-08-28: 150 mg via ORAL
  Filled 2013-08-28 (×2): qty 1

## 2013-08-28 MED ORDER — ENOXAPARIN SODIUM 40 MG/0.4ML ~~LOC~~ SOLN: 40.0000 mg | SUBCUTANEOUS | Status: DC

## 2013-08-28 MED ORDER — IOHEXOL 300 MG/ML  SOLN
80.0000 mL | Freq: Once | INTRAMUSCULAR | Status: AC | PRN
  Administered 2013-08-28: 80 mL via INTRAVENOUS

## 2013-08-28 MED ORDER — IOHEXOL 300 MG/ML  SOLN
25.0000 mL | INTRAMUSCULAR | Status: AC
  Administered 2013-08-28: 25 mL via ORAL

## 2013-08-28 NOTE — Progress Notes (Signed)
Discussed with Drs. Briant Sites, [place]   Progress Note    Mike Mack RDE:[id number] DOB: August 09, 1935 DOA: 08/14/2013 PCP: [name], MD  Brief narrative: 78 y.o. male who presented on 08/14/2013 with a hx of HTN, HLD, prior stroke, recent laparoscopic cholecystectomy 07/25/2013 by [name] (discharged to [facility] 07/30/2013) with several days duration of persistent epigastric abdominal pain associated with fevers, chills, and malaise.   In the ED CT abdomen was notable for abscess in the gallbladder bed, as well as peri-hepatic abscesses. Surgery was consulted and [facility] was asked to admit for further evaluation. Pt started on Vancomycin and Zosyn in ED.   Assessment/Plan:  Severe sepsis /  Enterococcus + ?Morganella bacteremia / subhepatic abscess /  epigastric abscess  Changed to zosyn due to concerns re: liver abscess. Repeat CT shows abscess bigger. IR to drain. Advance diet. Hold lovenox for procedure.  Diabetes mellitus with neuropathy Improved CBGs after changes made  Hypertension -Continue current meds, follow.  H/o Stroke w/ R sided weakness On ASA  Anemia of chronic disease Monitor  Chronic steroid use - no need for stress dose steroids currently - unclear why pt is on chronic steroids (?arthritits) - suggest very slow wean to off as outpt if able   Thrush - cont diflucan - has sore throat as well which may be consistent with candida esophagitis - sx improving slowly   BPH - Flomax  Elevated INR - due to poor nutrition? - given 1 U FFP prior to procedure - f/u normal  Code Status: FULL Family Communication: wife at bedside Disposition Plan: SNF   Consultants: Gen Surg  IR ID  Procedures: 2/27 - abdom abscess drain x2 in IR  2/28 - TTE - mild LVH - EF 55-60% - no WMA - grade 1 DD  Antibiotics: Ampicillin 3/02 >>3/9 Diflucan 3/01 >>  Zosyn 2/26 >> 3/02; restarted 3/9>> Vanc 2/26 >> 3/01  DVT prophylaxis: Lovenox  Subjective: No  complaints. hungry Objective: Filed Weights   08/26/13 0528 08/27/13 0641 08/28/13 0553  Weight: 59.8 kg (131 lb 13.4 oz) 57.586 kg (126 lb 15.3 oz) 55.65 kg (122 lb 11 oz)   Blood pressure 101/50, pulse 62, temperature 98.2 F (36.8 C), temperature source Oral, resp. rate 18, height 5\' 5"  (1.651 m), weight 55.65 kg (122 lb 11 oz), SpO2 98.00%.  Intake/Output Summary (Last 24 hours) at 08/28/13 1517 Last data filed at 08/28/13 1500  Gross per 24 hour  Intake    240 ml  Output   1525 ml  Net  -1285 ml   Exam: General: No acute respiratory distress, alert and appropriate Lungs: Decreased breath sounds at the bases, no crackles or wheezes Cardiovascular: Regular rate and rhythm without murmur gallop or rub  Abdomen: dressings CDI. Soft, nontender, nondistended Extremities: No significant cyanosis, clubbing, or edema bilateral lower extremities  Data Reviewed: Basic Metabolic Panel:  Recent Labs Lab 08/26/13 0537  NA 136*  K 4.2  CL 98  CO2 29  GLUCOSE 123*  BUN 11  CREATININE 0.95  CALCIUM 8.3*   Liver Function Tests: No results found for this basename: AST, ALT, ALKPHOS, BILITOT, PROT, ALBUMIN,  in the last 168 hours CBC:  Recent Labs Lab 08/24/13 0457 08/25/13 0450 08/26/13 0537 08/27/13 0435  WBC 8.2 8.9 8.6 8.8  NEUTROABS 6.2  --   --   --   HGB 7.1* 7.0* 7.1* 7.3*  HCT 21.9* 21.4* 22.0* 22.9*  MCV 81.1 80.1 80.6 80.6  PLT 450* 499* 527*  576*   Cardiac Enzymes: No results found for this basename: CKTOTAL, CKMB, CKMBINDEX, TROPONINI,  in the last 168 hours CBG:  Recent Labs Lab 08/27/13 1658 08/27/13 1821 08/27/13 2212 08/28/13 0801 08/28/13 1306  GLUCAP 406* 329* 281* 177* 106*    No results found for this or any previous visit (from the past 240 hour(s)).   Studies:  Recent x-ray studies have been reviewed in detail by the Attending Physician  Scheduled Meds:  Scheduled Meds: . amLODipine  10 mg Oral Daily  . aspirin EC  81 mg Oral  Daily  . enoxaparin (LOVENOX) injection  40 mg Subcutaneous Q24H  . feeding supplement (ENSURE COMPLETE)  237 mL Oral BID PC  . feeding supplement (ENSURE)  1 Container Oral Q24H  . feeding supplement (GLUCERNA SHAKE)  237 mL Oral Q24H  . fluconazole  100 mg Oral Daily  . insulin aspart  0-20 Units Subcutaneous TID WC  . insulin aspart  0-5 Units Subcutaneous QHS  . insulin glargine  10 Units Subcutaneous QHS  . irbesartan  300 mg Oral QHS  . latanoprost  1 drop Both Eyes QHS  . multivitamin with minerals  1 tablet Oral Daily  . nystatin  5 mL Oral QID  . piperacillin-tazobactam (ZOSYN)  IV  3.375 g Intravenous 3 times per day  . polyethylene glycol  17 g Oral TID  . predniSONE  5 mg Oral Q breakfast  . tamsulosin  0.4 mg Oral BID   Time spent on care of this patient: 25 min  [name], MD [facility]  08/28/2013, 3:17 PM   LOS: 14 days

## 2013-08-28 NOTE — Progress Notes (Signed)
Subjective: Patient presented 08/14/13 s/p lap chole 07/25/13 with epigastric pain, found to have severe sepsis and has been on IV antibiotics. He is currently afebrile with wbc wnl. He has had imaging with percutaneous drain placement on 2/27 38F RUQ drain and 32F mid/epigastric drain, these drains have since been removed. Follow-up imaging 08/22/13 revealed inferior right hepatic lobe low-density unable to exclude abscess. IR recommended a follow up CT in a week. CT today revealed interval enlargement of a complex multilocular rim enhancing fluid collection in the inferior aspect of the right lobe of the liver. IR received request for image guided aspiration with possible drain placement. The patient denies any abdominal pain, chest pain or shortness of breath. He denies any active bleeding, fever or chills. He denies any complications with previous sedation.   Objective: Physical Exam: BP 101/50  Pulse 62  Temp(Src) 98.2 F (36.8 C) (Oral)  Resp 18  Ht 5\' 5"  (1.651 m)  Wt 122 lb 11 oz (55.65 kg)  BMI 20.42 kg/m2  SpO2 98%  General: A&Ox3, NAD Heart: RRR without M/G/R Lungs: CTA upper fields, diminished bases. Abd: Soft, NT, ND, (+) BS  Labs: CBC  Recent Labs  08/26/13 0537 08/27/13 0435  WBC 8.6 8.8  HGB 7.1* 7.3*  HCT 22.0* 22.9*  PLT 527* 576*   BMET  Recent Labs  08/26/13 0537  NA 136*  K 4.2  CL 98  CO2 29  GLUCOSE 123*  BUN 11  CREATININE 0.95  CALCIUM 8.3*   LFT No results found for this basename: PROT, ALBUMIN, AST, ALT, ALKPHOS, BILITOT, BILIDIR, IBILI, LIPASE,  in the last 72 hours PT/INR No results found for this basename: LABPROT, INR,  in the last 72 hours   Studies/Results: Ct Abdomen W Contrast  08/28/2013   CLINICAL DATA:  Liver abscess.  Evaluate for progression.  EXAM: CT ABDOMEN WITH CONTRAST  TECHNIQUE: Multidetector CT imaging of the abdomen was performed using the standard protocol following bolus administration of intravenous contrast.   CONTRAST:  58mL OMNIPAQUE IOHEXOL 300 MG/ML  SOLN  COMPARISON:  CT of the abdomen and pelvis 08/22/2013.  FINDINGS: Lung Bases: Incompletely visualized right pleural effusion which is moderate to large. Small left pleural effusion. Passive atelectasis in the visualized portions of the lung bases bilaterally. Tip of a central venous catheter is noted in the superior aspect of the right atrium. Trace amount of pericardial fluid and/or thickening, unchanged, and unlikely to be of hemodynamic significance at this time.  Abdomen: Complex rim enhancing fluid collection measuring approximately 6.7 x 4.8 x 5.9 cm in the inferior aspect of the right lobe of the liver appears slightly larger than the prior examination from 08/22/2013. There is again a more peripheral portion of the collection in the anterior aspect of the liver (predominantly segment 4B), where there was previously a drainage catheter which has since been removed. The smaller portion of the collection measures approximately 4.9 x 1.7 cm (image 22 of series 2) and contain several internal locular of gas. Immediately adjacent to this overlying the anterior surface of the liver adjacent to segment 4A there is a small gas and fluid containing collection which is favored to be subcapsular (rather than pneumoperitoneum) which is essentially unchanged compared to the prior study, currently measuring 4.2 x 0.9 cm. No new hepatic collections are noted. Status post cholecystectomy. No intrahepatic biliary ductal dilatation. Other previously noted drainage catheter in the epigastric region has been removed.  The appearance of the pancreas, spleen and bilateral  adrenal glands is unremarkable. 3 mm nonobstructive calculus in the lower pole collecting system of the right kidney. Multiple low-attenuation lesions in the kidneys bilaterally, many of which are too small to definitively characterize. The largest of these lesions are all compatible with simple cysts, with the  largest of these measuring 2.3 cm in diameter in the lower pole of the left kidney. Within the visualized portions of the peritoneal cavity there is no significant volume of ascites and no definite pneumoperitoneum. No pathologic distention of small bowel. No lymphadenopathy.  Musculoskeletal: There are no aggressive appearing lytic or blastic lesions noted in the visualized portions of the skeleton.  IMPRESSION: 1. Interval enlargement of a complex multilocular rim enhancing fluid collection in the inferior aspect of the right lobe of the liver, compatible with progressive enlargement of an intrahepatic abscess. 2. Previously noted peripheral abscess in segment 4B at site of prior drain placement is again noted, currently measuring 1.7 x 4.9 cm with several tiny locules of internal gas. There is also a small gas and fluid collection which appears to be subcapsular overlying the mass which is essentially unchanged compared to the prior study. Previously noted drainage catheters have both been removed. 3. Moderate to large right pleural effusion and small left pleural effusion are both unchanged, with extensive passive atelectasis in the lower lobes of the lungs bilaterally (right greater than left). 4. Trace amount of pericardial fluid and/or thickening, unlikely to be of hemodynamic significance at this time. This finding is unchanged. 5. Additional incidental findings, as above.   Electronically Signed   By: Vinnie Langton M.D.   On: 08/28/2013 13:07    Assessment/Plan: S/p GB fossa/ epigastric abscess drains placed 2/27 CT 3/6 improved drain collections, all drains removed. New inferior right hepatic lobe area of concern for abscess, follow-up CT in 1 week recommended.  CT 3/12 with enlarging inferior right hepatic abscess compared to 3/6. Scheduled for image guided hepatic abscess aspiration with possible drain placement. Patient will be NPO after midnight, lovenox will be held, labs and images  reviewed. Risks and Benefits discussed with the patient and his wife. All questions were answered, patient and wife are agreeable to proceed. Consent signed and in chart.   LOS: 14 days    Rockney Ghee 08/28/2013 4:17 PM

## 2013-08-28 NOTE — Progress Notes (Signed)
[facility]  Total days of antibiotics 15  Day 3 piperacillin    Subjective: No new complaints   Antibiotics:  Anti-infectives   Start     Dose/Rate Route Frequency Ordered Stop   08/25/13 1400  piperacillin-tazobactam (ZOSYN) IVPB 3.375 g     3.375 g 12.5 mL/hr over 240 Minutes Intravenous 3 times per day 08/25/13 1359     08/18/13 1645  fluconazole (DIFLUCAN) tablet 100 mg     100 mg Oral Daily 08/18/13 1641     08/18/13 1200  ampicillin (OMNIPEN) 2 g in sodium chloride 0.9 % 50 mL IVPB  Status:  Discontinued    Comments:  Pharmacy may adjust as needed   2 g 150 mL/hr over 20 Minutes Intravenous 4 times per day 08/18/13 1049 08/25/13 1300   08/17/13 1000  fluconazole (DIFLUCAN) IVPB 100 mg  Status:  Discontinued     100 mg 50 mL/hr over 60 Minutes Intravenous Every 24 hours 08/17/13 0936 08/18/13 1641   08/16/13 1100  vancomycin (VANCOCIN) 500 mg in sodium chloride 0.9 % 100 mL IVPB  Status:  Discontinued     500 mg 100 mL/hr over 60 Minutes Intravenous Every 12 hours 08/16/13 1016 08/18/13 1049   08/15/13 1200  vancomycin (VANCOCIN) 1,250 mg in sodium chloride 0.9 % 250 mL IVPB  Status:  Discontinued     1,250 mg 166.7 mL/hr over 90 Minutes Intravenous Every 24 hours 08/14/13 1911 08/16/13 1016   08/15/13 0000  piperacillin-tazobactam (ZOSYN) IVPB 3.375 g  Status:  Discontinued     3.375 g 12.5 mL/hr over 240 Minutes Intravenous Every 8 hours 08/14/13 1911 08/18/13 1049   08/14/13 1530  vancomycin (VANCOCIN) IVPB 1000 mg/200 mL premix     1,000 mg 200 mL/hr over 60 Minutes Intravenous  Once 08/14/13 1526 08/14/13 1823   08/14/13 1530  piperacillin-tazobactam (ZOSYN) IVPB 3.375 g     3.375 g 100 mL/hr over 30 Minutes Intravenous  Once 08/14/13 1526 08/14/13 1603      Medications: Scheduled Meds: . amLODipine  10 mg Oral Daily  . feeding supplement (ENSURE COMPLETE)  237 mL Oral BID PC  . feeding supplement (ENSURE)  1 Container Oral Q24H   . feeding supplement (GLUCERNA SHAKE)  237 mL Oral Q24H  . fluconazole  100 mg Oral Daily  . insulin aspart  0-20 Units Subcutaneous TID WC  . insulin aspart  0-5 Units Subcutaneous QHS  . insulin glargine  10 Units Subcutaneous QHS  . irbesartan  150 mg Oral QHS  . latanoprost  1 drop Both Eyes QHS  . multivitamin with minerals  1 tablet Oral Daily  . nystatin  5 mL Oral QID  . piperacillin-tazobactam (ZOSYN)  IV  3.375 g Intravenous 3 times per day  . polyethylene glycol  17 g Oral TID  . predniSONE  5 mg Oral Q breakfast  . tamsulosin  0.4 mg Oral BID   Continuous Infusions:  PRN Meds:.acetaminophen, acetaminophen, gabapentin, guaifenesin, HYDROcodone-acetaminophen, levalbuterol, menthol-cetylpyridinium, ondansetron (ZOFRAN) IV, ondansetron, sodium chloride   Objective: Weight change: -4 lb 4.3 oz (-1.936 kg)  Intake/Output Summary (Last 24 hours) at 08/28/13 1627 Last data filed at 08/28/13 1500  Gross per 24 hour  Intake    240 ml  Output   1525 ml  Net  -1285 ml   Blood pressure 101/50, pulse 62, temperature 98.2 F (36.8 C), temperature source Oral, resp. rate 18, height 5\' 5"  (1.651 m), weight 122 lb  11 oz (55.65 kg), SpO2 98.00%. Temp:  [98.2 F (36.8 C)] 98.2 F (36.8 C) (03/12 0553) Pulse Rate:  [62-77] 62 (03/12 0553) Resp:  [18] 18 (03/12 0553) BP: (101-106)/(50-52) 101/50 mmHg (03/12 0553) SpO2:  [98 %-100 %] 98 % (03/12 0553) Weight:  [122 lb 11 oz (55.65 kg)] 122 lb 11 oz (55.65 kg) (03/12 0553)  Physical Exam: General: Alert and awake, oriented He is seated eating food, a bit dispirited Neuro: nonfocal  Lab Results:  Recent Labs  08/26/13 0537 08/27/13 0435  WBC 8.6 8.8  HGB 7.1* 7.3*  HCT 22.0* 22.9*  PLT 527* 576*    BMET  Recent Labs  08/26/13 0537  NA 136*  K 4.2  CL 98  CO2 29  GLUCOSE 123*  BUN 11  CREATININE 0.95  CALCIUM 8.3*    Micro Results: No results found for this or any previous visit (from the past 240  hour(s)).  Studies/Results: Ct Abdomen W Contrast  08/28/2013   CLINICAL DATA:  Liver abscess.  Evaluate for progression.  EXAM: CT ABDOMEN WITH CONTRAST  TECHNIQUE: Multidetector CT imaging of the abdomen was performed using the standard protocol following bolus administration of intravenous contrast.  CONTRAST:  45mL OMNIPAQUE IOHEXOL 300 MG/ML  SOLN  COMPARISON:  CT of the abdomen and pelvis 08/22/2013.  FINDINGS: Lung Bases: Incompletely visualized right pleural effusion which is moderate to large. Small left pleural effusion. Passive atelectasis in the visualized portions of the lung bases bilaterally. Tip of a central venous catheter is noted in the superior aspect of the right atrium. Trace amount of pericardial fluid and/or thickening, unchanged, and unlikely to be of hemodynamic significance at this time.  Abdomen: Complex rim enhancing fluid collection measuring approximately 6.7 x 4.8 x 5.9 cm in the inferior aspect of the right lobe of the liver appears slightly larger than the prior examination from 08/22/2013. There is again a more peripheral portion of the collection in the anterior aspect of the liver (predominantly segment 4B), where there was previously a drainage catheter which has since been removed. The smaller portion of the collection measures approximately 4.9 x 1.7 cm (image 22 of series 2) and contain several internal locular of gas. Immediately adjacent to this overlying the anterior surface of the liver adjacent to segment 4A there is a small gas and fluid containing collection which is favored to be subcapsular (rather than pneumoperitoneum) which is essentially unchanged compared to the prior study, currently measuring 4.2 x 0.9 cm. No new hepatic collections are noted. Status post cholecystectomy. No intrahepatic biliary ductal dilatation. Other previously noted drainage catheter in the epigastric region has been removed.  The appearance of the pancreas, spleen and bilateral  adrenal glands is unremarkable. 3 mm nonobstructive calculus in the lower pole collecting system of the right kidney. Multiple low-attenuation lesions in the kidneys bilaterally, many of which are too small to definitively characterize. The largest of these lesions are all compatible with simple cysts, with the largest of these measuring 2.3 cm in diameter in the lower pole of the left kidney. Within the visualized portions of the peritoneal cavity there is no significant volume of ascites and no definite pneumoperitoneum. No pathologic distention of small bowel. No lymphadenopathy.  Musculoskeletal: There are no aggressive appearing lytic or blastic lesions noted in the visualized portions of the skeleton.  IMPRESSION: 1. Interval enlargement of a complex multilocular rim enhancing fluid collection in the inferior aspect of the right lobe of the liver, compatible with progressive  enlargement of an intrahepatic abscess. 2. Previously noted peripheral abscess in segment 4B at site of prior drain placement is again noted, currently measuring 1.7 x 4.9 cm with several tiny locules of internal gas. There is also a small gas and fluid collection which appears to be subcapsular overlying the mass which is essentially unchanged compared to the prior study. Previously noted drainage catheters have both been removed. 3. Moderate to large right pleural effusion and small left pleural effusion are both unchanged, with extensive passive atelectasis in the lower lobes of the lungs bilaterally (right greater than left). 4. Trace amount of pericardial fluid and/or thickening, unlikely to be of hemodynamic significance at this time. This finding is unchanged. 5. Additional incidental findings, as above.   Electronically Signed   By: Vinnie Langton M.D.   On: 08/28/2013 13:07      Assessment/Plan: Mike Mack is a 78 y.o. male with  intrabdominal abscess, Amp Sensitive Enterococcal and Morganella bacteremia, now found to  have enlarging liver abscess  #1 LIver abscess: --needs IR drainage for bacterial cultures and anaerobic cultures --continue current zosyn/fluconazole  #2 Amp S enterococcal and Morganella bacteremia: --on effective abx for both of these  Dr. Linus Salmons back tomorrow.    LOS: 14 days   Alcide Evener 08/28/2013, 4:27 PM

## 2013-08-29 ENCOUNTER — Inpatient Hospital Stay (HOSPITAL_COMMUNITY): Payer: Medicare Other

## 2013-08-29 DIAGNOSIS — R000 Tachycardia, unspecified: Secondary | ICD-10-CM

## 2013-08-29 DIAGNOSIS — R05 Cough: Secondary | ICD-10-CM | POA: Clinically undetermined

## 2013-08-29 DIAGNOSIS — R059 Cough, unspecified: Secondary | ICD-10-CM | POA: Clinically undetermined

## 2013-08-29 LAB — GLUCOSE, CAPILLARY
GLUCOSE-CAPILLARY: 154 mg/dL — AB (ref 70–99)
GLUCOSE-CAPILLARY: 105 mg/dL — AB (ref 70–99)
Glucose-Capillary: 152 mg/dL — ABNORMAL HIGH (ref 70–99)
Glucose-Capillary: 176 mg/dL — ABNORMAL HIGH (ref 70–99)

## 2013-08-29 LAB — CBC WITH DIFFERENTIAL/PLATELET
Basophils Absolute: 0 10*3/uL (ref 0.0–0.1)
Basophils Relative: 0 % (ref 0–1)
EOS PCT: 0 % (ref 0–5)
Eosinophils Absolute: 0 10*3/uL (ref 0.0–0.7)
HEMATOCRIT: 25.2 % — AB (ref 39.0–52.0)
Hemoglobin: 8.2 g/dL — ABNORMAL LOW (ref 13.0–17.0)
LYMPHS ABS: 0.4 10*3/uL — AB (ref 0.7–4.0)
LYMPHS PCT: 2 % — AB (ref 12–46)
MCH: 26.5 pg (ref 26.0–34.0)
MCHC: 32.5 g/dL (ref 30.0–36.0)
MCV: 81.6 fL (ref 78.0–100.0)
MONO ABS: 0.7 10*3/uL (ref 0.1–1.0)
Monocytes Relative: 3 % (ref 3–12)
Neutro Abs: 21.1 10*3/uL — ABNORMAL HIGH (ref 1.7–7.7)
Neutrophils Relative %: 95 % — ABNORMAL HIGH (ref 43–77)
Platelets: 529 10*3/uL — ABNORMAL HIGH (ref 150–400)
RBC: 3.09 MIL/uL — AB (ref 4.22–5.81)
RDW: 18.8 % — ABNORMAL HIGH (ref 11.5–15.5)
WBC: 22.2 10*3/uL — AB (ref 4.0–10.5)

## 2013-08-29 LAB — BASIC METABOLIC PANEL
BUN: 15 mg/dL (ref 6–23)
CALCIUM: 8.6 mg/dL (ref 8.4–10.5)
CO2: 25 meq/L (ref 19–32)
CREATININE: 1.1 mg/dL (ref 0.50–1.35)
Chloride: 98 mEq/L (ref 96–112)
GFR calc Af Amer: 74 mL/min — ABNORMAL LOW (ref >90)
GFR calc non Af Amer: 64 mL/min — ABNORMAL LOW (ref >90)
Glucose, Bld: 128 mg/dL — ABNORMAL HIGH (ref 70–99)
Potassium: 4.2 mEq/L (ref 3.7–5.3)
SODIUM: 134 meq/L — AB (ref 137–147)

## 2013-08-29 LAB — PROTIME-INR
INR: 1.2 (ref 0.00–1.49)
Prothrombin Time: 14.9 seconds (ref 11.6–15.2)

## 2013-08-29 LAB — MAGNESIUM: Magnesium: 1.7 mg/dL (ref 1.5–2.5)

## 2013-08-29 LAB — APTT: aPTT: 32 seconds (ref 24–37)

## 2013-08-29 LAB — HEPATITIS C ANTIBODY (REFLEX): HCV AB: NEGATIVE

## 2013-08-29 MED ORDER — METOPROLOL TARTRATE 1 MG/ML IV SOLN: 5.0000 mg | Freq: Four times a day (QID) | INTRAVENOUS | Status: DC | PRN

## 2013-08-29 MED ORDER — FENTANYL CITRATE 0.05 MG/ML IJ SOLN
INTRAMUSCULAR | Status: AC | PRN
  Administered 2013-08-29: 50 ug via INTRAVENOUS
  Administered 2013-08-29: 25 ug via INTRAVENOUS

## 2013-08-29 MED ORDER — SODIUM CHLORIDE 0.9 % IV BOLUS (SEPSIS)
500.0000 mL | Freq: Once | INTRAVENOUS | Status: AC
  Administered 2013-08-29: 500 mL via INTRAVENOUS

## 2013-08-29 MED ORDER — GUAIFENESIN-DM 100-10 MG/5ML PO SYRP
5.0000 mL | ORAL_SOLUTION | ORAL | Status: DC | PRN
  Administered 2013-09-01: 5 mL via ORAL
  Filled 2013-08-29: qty 5

## 2013-08-29 MED ORDER — MIDAZOLAM HCL 2 MG/2ML IJ SOLN
INTRAMUSCULAR | Status: AC
  Filled 2013-08-29: qty 4

## 2013-08-29 MED ORDER — MIDAZOLAM HCL 2 MG/2ML IJ SOLN
INTRAMUSCULAR | Status: AC | PRN
  Administered 2013-08-29 (×2): 1 mg via INTRAVENOUS

## 2013-08-29 MED ORDER — FENTANYL CITRATE 0.05 MG/ML IJ SOLN
INTRAMUSCULAR | Status: AC
  Filled 2013-08-29: qty 4

## 2013-08-29 MED ORDER — MEPERIDINE HCL 50 MG/ML IJ SOLN
INTRAMUSCULAR | Status: AC
  Filled 2013-08-29: qty 1

## 2013-08-29 NOTE — Progress Notes (Addendum)
[facility]  Date of Admission:  08/14/2013  Antibiotics: Vancomycin 5 days Zosyn 5 days and now day 4 Ampicillin 8 days Fluconazole for thrush  Subjective: No complaints  Objective: Temp:  [98 F (36.7 C)-98.8 F (37.1 C)] 98.8 F (37.1 C) (03/13 0538) Pulse Rate:  [67-74] 74 (03/13 0538) Resp:  [18] 18 (03/13 0538) BP: (105-120)/(50-62) 120/62 mmHg (03/13 0538) SpO2:  [99 %-100 %] 99 % (03/13 0538) Weight:  [125 lb (56.7 kg)] 125 lb (56.7 kg) (03/13 0500)  General: awake, alert, in bed, nad   Lab Results Lab Results  Component Value Date   WBC 8.8 08/27/2013   HGB 7.3* 08/27/2013   HCT 22.9* 08/27/2013   MCV 80.6 08/27/2013   PLT 576* 08/27/2013    Lab Results  Component Value Date   CREATININE 0.95 08/26/2013   BUN 11 08/26/2013   NA 136* 08/26/2013   K 4.2 08/26/2013   CL 98 08/26/2013   CO2 29 08/26/2013    Lab Results  Component Value Date   ALT 36 08/21/2013   AST 29 08/21/2013   ALKPHOS 186* 08/21/2013   BILITOT 0.2* 08/21/2013      Microbiology: No results found for this or any previous visit (from the past 240 hour(s)).  Studies/Results: Ct Abdomen W Contrast  08/28/2013   CLINICAL DATA:  Liver abscess.  Evaluate for progression.  EXAM: CT ABDOMEN WITH CONTRAST  TECHNIQUE: Multidetector CT imaging of the abdomen was performed using the standard protocol following bolus administration of intravenous contrast.  CONTRAST:  66mL OMNIPAQUE IOHEXOL 300 MG/ML  SOLN  COMPARISON:  CT of the abdomen and pelvis 08/22/2013.  FINDINGS: Lung Bases: Incompletely visualized right pleural effusion which is moderate to large. Small left pleural effusion. Passive atelectasis in the visualized portions of the lung bases bilaterally. Tip of a central venous catheter is noted in the superior aspect of the right atrium. Trace amount of pericardial fluid and/or thickening, unchanged, and unlikely to be of hemodynamic significance at this time.  Abdomen: Complex  rim enhancing fluid collection measuring approximately 6.7 x 4.8 x 5.9 cm in the inferior aspect of the right lobe of the liver appears slightly larger than the prior examination from 08/22/2013. There is again a more peripheral portion of the collection in the anterior aspect of the liver (predominantly segment 4B), where there was previously a drainage catheter which has since been removed. The smaller portion of the collection measures approximately 4.9 x 1.7 cm (image 22 of series 2) and contain several internal locular of gas. Immediately adjacent to this overlying the anterior surface of the liver adjacent to segment 4A there is a small gas and fluid containing collection which is favored to be subcapsular (rather than pneumoperitoneum) which is essentially unchanged compared to the prior study, currently measuring 4.2 x 0.9 cm. No new hepatic collections are noted. Status post cholecystectomy. No intrahepatic biliary ductal dilatation. Other previously noted drainage catheter in the epigastric region has been removed.  The appearance of the pancreas, spleen and bilateral adrenal glands is unremarkable. 3 mm nonobstructive calculus in the lower pole collecting system of the right kidney. Multiple low-attenuation lesions in the kidneys bilaterally, many of which are too small to definitively characterize. The largest of these lesions are all compatible with simple cysts, with the largest of these measuring 2.3 cm in diameter in the lower pole of the left kidney. Within the visualized portions of the peritoneal cavity there is no significant  volume of ascites and no definite pneumoperitoneum. No pathologic distention of small bowel. No lymphadenopathy.  Musculoskeletal: There are no aggressive appearing lytic or blastic lesions noted in the visualized portions of the skeleton.  IMPRESSION: 1. Interval enlargement of a complex multilocular rim enhancing fluid collection in the inferior aspect of the right lobe of  the liver, compatible with progressive enlargement of an intrahepatic abscess. 2. Previously noted peripheral abscess in segment 4B at site of prior drain placement is again noted, currently measuring 1.7 x 4.9 cm with several tiny locules of internal gas. There is also a small gas and fluid collection which appears to be subcapsular overlying the mass which is essentially unchanged compared to the prior study. Previously noted drainage catheters have both been removed. 3. Moderate to large right pleural effusion and small left pleural effusion are both unchanged, with extensive passive atelectasis in the lower lobes of the lungs bilaterally (right greater than left). 4. Trace amount of pericardial fluid and/or thickening, unlikely to be of hemodynamic significance at this time. This finding is unchanged. 5. Additional incidental findings, as above.   Electronically Signed   By: Vinnie Langton M.D.   On: 08/28/2013 13:07    Assessment/Plan: 1) abdominal abscess - Enterococcus in blood cutlures, abscess cultures, drained by IR 2/27, purulent.   Morganella also noted on one blood culture but gram stain noted Gram positive. CT with contrast noted liver abscess.  On Zosyn, drainage by IR today.  Will await cultures.     Dr. Baxter Flattery available over the weekend if needed, otherwise I will follow up on Monday.  Thanks    Mike Mack, [facility] [place] [email or web address] [phone] pager   [phone] cell 08/29/2013, 11:19 AM

## 2013-08-29 NOTE — Progress Notes (Signed)
ekg show sinus tach. Will check stat BMET, CBC, bolus IVF. Watch for signs of sepsis. Hold antihypertensives for now, though not hypotensive.  Doree Barthel, M.D.

## 2013-08-29 NOTE — Clinical Social Work Note (Signed)
CSW has called and updated [facility] regarding pt not discharging on 08/29/2013. Weekend CSW to call [facility] if pt medically stable for discharge over weekend. CSW to continue to follow and assist with discharge planning once pt is medically stable.  Pati Gallo, [place] Social Worker [phone]

## 2013-08-29 NOTE — Progress Notes (Signed)
Progress Note    Mike Mack CHE:[id number] DOB: 1936-06-19 DOA: 08/14/2013 PCP: [name], MD  Brief narrative: 78 y.o. male who presented on 08/14/2013 with a hx of HTN, HLD, prior stroke, recent laparoscopic cholecystectomy 07/25/2013 by [name] (discharged to [facility] 07/30/2013) with several days duration of persistent epigastric abdominal pain associated with fevers, chills, and malaise.   In the ED CT abdomen was notable for abscess in the gallbladder bed, as well as peri-hepatic abscesses. Surgery was consulted and [facility] was asked to admit for further evaluation. Pt started on Vancomycin and Zosyn in ED.   Assessment/Plan: Tachycardia: check 12 lead EKG, place on tele  Cough: check CXR  Severe sepsis /  Enterococcus + ?Morganella bacteremia / subhepatic abscess /  epigastric abscess  Changed to zosyn due to concerns re: liver abscess. Repeat CT shows abscess bigger. IR to drain. Advance diet. Hold lovenox for procedure.  Diabetes mellitus with neuropathy Improved CBGs after changes made  Hypertension BP erratic. Hold meds for now.  H/o Stroke w/ R sided weakness On ASA  Anemia of chronic disease Monitor  Chronic steroid use - no need for stress dose steroids currently - unclear why pt is on chronic steroids (?arthritits) - suggest very slow wean to off as outpt if able   Thrush - cont diflucan - has sore throat as well which may be consistent with candida esophagitis - sx improving slowly   BPH - Flomax   Code Status: FULL Family Communication: wife at bedside Disposition Plan: SNF   Consultants: Gen Surg  IR ID  Procedures: 2/27 - abdom abscess drain x2 in IR  2/28 - TTE - mild LVH - EF 55-60% - no WMA - grade 1 DD  Antibiotics: Ampicillin 3/02 >>3/9 Diflucan 3/01 >>  Zosyn 2/26 >> 3/02; restarted 3/9>> Vanc 2/26 >> 3/01  DVT prophylaxis: Lovenox  Subjective: Cough. Denies dyspnea. Just got po pain meds Objective: Filed Weights   08/27/13 0641 08/28/13 0553 08/29/13 0500  Weight: 57.586 kg (126 lb 15.3 oz) 55.65 kg (122 lb 11 oz) 56.7 kg (125 lb)   Blood pressure 128/71, pulse 151, temperature 99 F (37.2 C), temperature source Oral, resp. rate 16, height 5\' 5"  (1.651 m), weight 56.7 kg (125 lb), SpO2 96.00%.  Intake/Output Summary (Last 24 hours) at 08/29/13 1707 Last data filed at 08/29/13 1108  Gross per 24 hour  Intake      0 ml  Output   1450 ml  Net  -1450 ml   Exam: General: uncomfortable. Coughing. moaning Lungs: Decreased breath sounds at the bases, no crackles or wheezes Cardiovascular: Regular rate and rhythm without murmur gallop or rub  Abdomen: dressings CDI. Soft, nontender, nondistended Extremities: No significant cyanosis, clubbing, or edema bilateral lower extremities  Data Reviewed: Basic Metabolic Panel:  Recent Labs Lab 08/26/13 0537  NA 136*  K 4.2  CL 98  CO2 29  GLUCOSE 123*  BUN 11  CREATININE 0.95  CALCIUM 8.3*   Liver Function Tests: No results found for this basename: AST, ALT, ALKPHOS, BILITOT, PROT, ALBUMIN,  in the last 168 hours CBC:  Recent Labs Lab 08/24/13 0457 08/25/13 0450 08/26/13 0537 08/27/13 0435  WBC 8.2 8.9 8.6 8.8  NEUTROABS 6.2  --   --   --   HGB 7.1* 7.0* 7.1* 7.3*  HCT 21.9* 21.4* 22.0* 22.9*  MCV 81.1 80.1 80.6 80.6  PLT 450* 499* 527* 576*   Cardiac Enzymes: No results found for this basename: CKTOTAL, CKMB,  CKMBINDEX, TROPONINI,  in the last 168 hours CBG:  Recent Labs Lab 08/28/13 1306 08/28/13 1800 08/28/13 2247 08/29/13 0743 08/29/13 1207  GLUCAP 106* 324* 278* 152* 154*    No results found for this or any previous visit (from the past 240 hour(s)).   Studies:  Recent x-ray studies have been reviewed in detail by the Attending Physician  Scheduled Meds:  Scheduled Meds: . feeding supplement (ENSURE COMPLETE)  237 mL Oral BID PC  . feeding supplement (ENSURE)  1 Container Oral Q24H  . feeding supplement (GLUCERNA  SHAKE)  237 mL Oral Q24H  . fentaNYL      . fluconazole  100 mg Oral Daily  . insulin aspart  0-20 Units Subcutaneous TID WC  . insulin aspart  0-5 Units Subcutaneous QHS  . insulin glargine  10 Units Subcutaneous QHS  . latanoprost  1 drop Both Eyes QHS  . midazolam      . multivitamin with minerals  1 tablet Oral Daily  . nystatin  5 mL Oral QID  . piperacillin-tazobactam (ZOSYN)  IV  3.375 g Intravenous 3 times per day  . polyethylene glycol  17 g Oral TID  . predniSONE  5 mg Oral Q breakfast  . tamsulosin  0.4 mg Oral BID   Time spent on care of this patient: 35 min  [name], MD [facility]  08/29/2013, 5:07 PM   LOS: 15 days

## 2013-08-29 NOTE — Procedures (Signed)
Interventional Radiology Procedure Note  Procedure: US guided placement of a 12F drain into the right hepatic abscess.  60 mL purulent fluid aspirated.  Tube to JP bulb suction. Complications: none Recommendations: - Bulb suction - Follow cx - Repeat imaging ([place] or CT) prior to tube removal.  Pt can follow up in IR clinic [phone]  Signed,  Criselda Peaches, MD [facility]

## 2013-08-29 NOTE — Progress Notes (Signed)
PT Cancellation Note  Patient Details Name: Auther Lyerly MRN: [id number] DOB: 16-Jun-1936   Cancelled Treatment:    Reason Eval/Treat Not Completed: Patient at procedure or test/unavailable. Patient going to IR any time now for drains and will need to be in the bed. Will follow up on Monday.    Jacqualyn Posey 08/29/2013, 1:24 PM

## 2013-08-29 NOTE — Sedation Documentation (Signed)
Patient denies pain and is resting comfortably.  

## 2013-08-29 NOTE — Progress Notes (Addendum)
Subjective: CT scan yesterday shows complex rim-enhancing fluid collection within the right lobe of the  liver. There is also a small peripheral  abscess at the anterior edge of the right lobe of the liver at the  site of prior drain, 1.7 X 4.9 cm, containing a few  tiny locules of internal gas. Discussed with Dr. Conley Canal yesterday. Plan IR drainage of the large fluid collection.  Otherwise patient is stable and unchanged. He is swallowing much better. Having daily bowel movements. Denies abdominal pain. Objective: Vital signs in last 24 hours: Temp:  [98 F (36.7 C)-98.2 F (36.8 C)] 98 F (36.7 C) (03/12 2144) Pulse Rate:  [62-67] 67 (03/12 2144) Resp:  [18] 18 (03/12 2144) BP: (101-105)/(50) 105/50 mmHg (03/12 2144) SpO2:  [98 %-100 %] 100 % (03/12 2144) Weight:  [122 lb 11 oz (55.65 kg)] 122 lb 11 oz (55.65 kg) (03/12 0553) Last BM Date: 08/28/13  Intake/Output from previous day: 03/12 0701 - 03/13 0700 In: 240 [P.O.:240] Out: 1200 [Urine:1200] Intake/Output this shift:    General appearance: alert and cooperative, but withdrawn with covers up over his head. In no distress. Feeble and deconditioned. GI: abdomen is soft and nontender. Not distended. No mass.  Lab Results:  Results for orders placed during the hospital encounter of 08/14/13 (from the past 24 hour(s))  GLUCOSE, CAPILLARY     Status: Abnormal   Collection Time    08/28/13  8:01 AM      Result Value Ref Range   Glucose-Capillary 177 (*) 70 - 99 mg/dL   Comment 1 Notify RN    GLUCOSE, CAPILLARY     Status: Abnormal   Collection Time    08/28/13  1:06 PM      Result Value Ref Range   Glucose-Capillary 106 (*) 70 - 99 mg/dL   Comment 1 Notify RN    GLUCOSE, CAPILLARY     Status: Abnormal   Collection Time    08/28/13  6:00 PM      Result Value Ref Range   Glucose-Capillary 324 (*) 70 - 99 mg/dL   Comment 1 Notify RN    GLUCOSE, CAPILLARY     Status: Abnormal   Collection Time    08/28/13 10:47 PM     Result Value Ref Range   Glucose-Capillary 278 (*) 70 - 99 mg/dL     Studies/Results: @RISRSLT24 @  . amLODipine  10 mg Oral Daily  . feeding supplement (ENSURE COMPLETE)  237 mL Oral BID PC  . feeding supplement (ENSURE)  1 Container Oral Q24H  . feeding supplement (GLUCERNA SHAKE)  237 mL Oral Q24H  . fluconazole  100 mg Oral Daily  . insulin aspart  0-20 Units Subcutaneous TID WC  . insulin aspart  0-5 Units Subcutaneous QHS  . insulin glargine  10 Units Subcutaneous QHS  . irbesartan  150 mg Oral QHS  . latanoprost  1 drop Both Eyes QHS  . multivitamin with minerals  1 tablet Oral Daily  . nystatin  5 mL Oral QID  . piperacillin-tazobactam (ZOSYN)  IV  3.375 g Intravenous 3 times per day  . polyethylene glycol  17 g Oral TID  . predniSONE  5 mg Oral Q breakfast  . tamsulosin  0.4 mg Oral BID     Assessment/Plan:   1. Laparoscopic cholecystectomy - 07/25/2013 - H. Heiress Williamson   2. Gall bladder fossa abscess and intraparenchymal liver abscess  Drainage 08/15/2013 - drains out and extrahepatic fluid collections resolved.  Enterococcus in  blood  On diflucan/zosyn- ID following  CT shows persistent and enlarging intrahepatic, intraparenchymal abscess. Scheduled to have this drained by IR today. Also has a smaller fluid collection at anterior edge of liver where prior drain was placed. Hopefully this will resolve on antibiotics without invasive intervention.  3. Thrush  On mycostatin/diflucan. swallowing better.   4. History of polio  5. IDDM  6. Arthritis - on chronic prednisone  7. DVT prophylaxis - Lovenox  8. Right pleural effusion  9. Anemia  Hgb - 7.3 - 08/27/2013 - stable   @PROBHOSP @  LOS: 15 days    Chinedum Vanhouten M 08/29/2013  . .prob

## 2013-08-30 DIAGNOSIS — R05 Cough: Secondary | ICD-10-CM

## 2013-08-30 DIAGNOSIS — R059 Cough, unspecified: Secondary | ICD-10-CM

## 2013-08-30 LAB — GLUCOSE, CAPILLARY
GLUCOSE-CAPILLARY: 416 mg/dL — AB (ref 70–99)
GLUCOSE-CAPILLARY: 463 mg/dL — AB (ref 70–99)
GLUCOSE-CAPILLARY: 403 mg/dL — AB (ref 70–99)
GLUCOSE-CAPILLARY: 191 mg/dL — AB (ref 70–99)
Glucose-Capillary: 164 mg/dL — ABNORMAL HIGH (ref 70–99)

## 2013-08-30 LAB — GLUCOSE, RANDOM: Glucose, Bld: 498 mg/dL — ABNORMAL HIGH (ref 70–99)

## 2013-08-30 MED ORDER — INSULIN ASPART 100 UNIT/ML ~~LOC~~ SOLN: 20.0000 [IU] | Freq: Once | SUBCUTANEOUS | Status: AC

## 2013-08-30 MED ORDER — POLYETHYLENE GLYCOL 3350 17 G PO PACK
17.0000 g | PACK | Freq: Every day | ORAL | Status: DC
  Administered 2013-08-31 – 2013-09-06 (×6): 17 g via ORAL
  Filled 2013-08-30 (×7): qty 1

## 2013-08-30 NOTE — Progress Notes (Signed)
Patient ID: Mike Mack, male   DOB: Oct 10, 1935, 78 y.o.   MRN: [id number] [facility] Surgery Progress Note:   * No surgery found *  Subjective: Mental status is OK.  Responds to questions appropriately Objective: Vital signs in last 24 hours: Temp:  [98.1 F (36.7 C)-99.3 F (37.4 C)] 98.1 F (36.7 C) (03/14 0639) Pulse Rate:  [74-151] 84 (03/14 0639) Resp:  [11-22] 16 (03/14 0639) BP: (84-174)/(37-76) 102/60 mmHg (03/14 0639) SpO2:  [94 %-100 %] 100 % (03/14 0639) Weight:  [128 lb 14.4 oz (58.469 kg)] 128 lb 14.4 oz (58.469 kg) (03/14 0639)  Intake/Output from previous day: 03/13 0701 - 03/14 0700 In: 370 [P.O.:360] Out: 1175 [Urine:1150; Drains:25] Intake/Output this shift: Total I/O In: 240 [P.O.:240] Out: -   Physical Exam: Work of breathing is not labored.  Trying to eat.    Lab Results:  Results for orders placed during the hospital encounter of 08/14/13 (from the past 48 hour(s))  GLUCOSE, CAPILLARY     Status: Abnormal   Collection Time    08/28/13  1:06 PM      Result Value Ref Range   Glucose-Capillary 106 (*) 70 - 99 mg/dL   Comment 1 Notify RN    GLUCOSE, CAPILLARY     Status: Abnormal   Collection Time    08/28/13  6:00 PM      Result Value Ref Range   Glucose-Capillary 324 (*) 70 - 99 mg/dL   Comment 1 Notify RN    GLUCOSE, CAPILLARY     Status: Abnormal   Collection Time    08/28/13 10:47 PM      Result Value Ref Range   Glucose-Capillary 278 (*) 70 - 99 mg/dL  HEPATITIS C ANTIBODY (REFLEX)     Status: None   Collection Time    08/29/13  5:35 AM      Result Value Ref Range   HCV Ab NEGATIVE  NEGATIVE   Comment: Performed at [facility], CAPILLARY     Status: Abnormal   Collection Time    08/29/13  7:43 AM      Result Value Ref Range   Glucose-Capillary 152 (*) 70 - 99 mg/dL   Comment 1 Notify RN    PROTIME-INR     Status: None   Collection Time    08/29/13 11:00 AM      Result Value Ref Range   Prothrombin Time  14.9  11.6 - 15.2 seconds   INR 1.20  0.00 - 1.49  APTT     Status: None   Collection Time    08/29/13 11:00 AM      Result Value Ref Range   aPTT 32  24 - 37 seconds  GLUCOSE, CAPILLARY     Status: Abnormal   Collection Time    08/29/13 12:07 PM      Result Value Ref Range   Glucose-Capillary 154 (*) 70 - 99 mg/dL   Comment 1 Notify RN    CULTURE, ROUTINE-ABSCESS     Status: None   Collection Time    08/29/13  3:01 PM      Result Value Ref Range   Specimen Description ABSCESS LIVER     Special Requests NONE     Gram Stain       Value: ABUNDANT WBC PRESENT,BOTH PMN AND MONONUCLEAR     NO SQUAMOUS EPITHELIAL CELLS SEEN     NO ORGANISMS SEEN     Performed at [facility]  Culture       Value: Culture reincubated for better growth     Performed at [facility]   Report Status PENDING    GLUCOSE, CAPILLARY     Status: Abnormal   Collection Time    08/29/13  5:26 PM      Result Value Ref Range   Glucose-Capillary 105 (*) 70 - 99 mg/dL  CBC WITH DIFFERENTIAL     Status: Abnormal   Collection Time    08/29/13  8:25 PM      Result Value Ref Range   WBC 22.2 (*) 4.0 - 10.5 K/uL   RBC 3.09 (*) 4.22 - 5.81 MIL/uL   Hemoglobin 8.2 (*) 13.0 - 17.0 g/dL   HCT 25.2 (*) 39.0 - 52.0 %   MCV 81.6  78.0 - 100.0 fL   MCH 26.5  26.0 - 34.0 pg   MCHC 32.5  30.0 - 36.0 g/dL   RDW 18.8 (*) 11.5 - 15.5 %   Platelets 529 (*) 150 - 400 K/uL   Neutrophils Relative % 95 (*) 43 - 77 %   Neutro Abs 21.1 (*) 1.7 - 7.7 K/uL   Lymphocytes Relative 2 (*) 12 - 46 %   Lymphs Abs 0.4 (*) 0.7 - 4.0 K/uL   Monocytes Relative 3  3 - 12 %   Monocytes Absolute 0.7  0.1 - 1.0 K/uL   Eosinophils Relative 0  0 - 5 %   Eosinophils Absolute 0.0  0.0 - 0.7 K/uL   Basophils Relative 0  0 - 1 %   Basophils Absolute 0.0  0.0 - 0.1 K/uL  BASIC METABOLIC PANEL     Status: Abnormal   Collection Time    08/29/13  8:25 PM      Result Value Ref Range   Sodium 134 (*) 137 - 147 mEq/L   Potassium  4.2  3.7 - 5.3 mEq/L   Chloride 98  96 - 112 mEq/L   CO2 25  19 - 32 mEq/L   Glucose, Bld 128 (*) 70 - 99 mg/dL   BUN 15  6 - 23 mg/dL   Creatinine, Ser 1.10  0.50 - 1.35 mg/dL   Calcium 8.6  8.4 - 10.5 mg/dL   GFR calc non Af Amer 64 (*) >90 mL/min   GFR calc Af Amer 74 (*) >90 mL/min   Comment: (NOTE)     The eGFR has been calculated using the CKD EPI equation.     This calculation has not been validated in all clinical situations.     eGFR's persistently <90 mL/min signify possible Chronic Kidney     Disease.  MAGNESIUM     Status: None   Collection Time    08/29/13  8:25 PM      Result Value Ref Range   Magnesium 1.7  1.5 - 2.5 mg/dL  GLUCOSE, CAPILLARY     Status: Abnormal   Collection Time    08/29/13  9:29 PM      Result Value Ref Range   Glucose-Capillary 176 (*) 70 - 99 mg/dL  GLUCOSE, CAPILLARY     Status: Abnormal   Collection Time    08/30/13  7:32 AM      Result Value Ref Range   Glucose-Capillary 164 (*) 70 - 99 mg/dL   Comment 1 Notify RN      Radiology/Results: Ct Abdomen W Contrast  08/28/2013   CLINICAL DATA:  Liver abscess.  Evaluate for progression.  EXAM: CT ABDOMEN  WITH CONTRAST  TECHNIQUE: Multidetector CT imaging of the abdomen was performed using the standard protocol following bolus administration of intravenous contrast.  CONTRAST:  58m OMNIPAQUE IOHEXOL 300 MG/ML  SOLN  COMPARISON:  CT of the abdomen and pelvis 08/22/2013.  FINDINGS: Lung Bases: Incompletely visualized right pleural effusion which is moderate to large. Small left pleural effusion. Passive atelectasis in the visualized portions of the lung bases bilaterally. Tip of a central venous catheter is noted in the superior aspect of the right atrium. Trace amount of pericardial fluid and/or thickening, unchanged, and unlikely to be of hemodynamic significance at this time.  Abdomen: Complex rim enhancing fluid collection measuring approximately 6.7 x 4.8 x 5.9 cm in the inferior aspect of the  right lobe of the liver appears slightly larger than the prior examination from 08/22/2013. There is again a more peripheral portion of the collection in the anterior aspect of the liver (predominantly segment 4B), where there was previously a drainage catheter which has since been removed. The smaller portion of the collection measures approximately 4.9 x 1.7 cm (image 22 of series 2) and contain several internal locular of gas. Immediately adjacent to this overlying the anterior surface of the liver adjacent to segment 4A there is a small gas and fluid containing collection which is favored to be subcapsular (rather than pneumoperitoneum) which is essentially unchanged compared to the prior study, currently measuring 4.2 x 0.9 cm. No new hepatic collections are noted. Status post cholecystectomy. No intrahepatic biliary ductal dilatation. Other previously noted drainage catheter in the epigastric region has been removed.  The appearance of the pancreas, spleen and bilateral adrenal glands is unremarkable. 3 mm nonobstructive calculus in the lower pole collecting system of the right kidney. Multiple low-attenuation lesions in the kidneys bilaterally, many of which are too small to definitively characterize. The largest of these lesions are all compatible with simple cysts, with the largest of these measuring 2.3 cm in diameter in the lower pole of the left kidney. Within the visualized portions of the peritoneal cavity there is no significant volume of ascites and no definite pneumoperitoneum. No pathologic distention of small bowel. No lymphadenopathy.  Musculoskeletal: There are no aggressive appearing lytic or blastic lesions noted in the visualized portions of the skeleton.  IMPRESSION: 1. Interval enlargement of a complex multilocular rim enhancing fluid collection in the inferior aspect of the right lobe of the liver, compatible with progressive enlargement of an intrahepatic abscess. 2. Previously noted  peripheral abscess in segment 4B at site of prior drain placement is again noted, currently measuring 1.7 x 4.9 cm with several tiny locules of internal gas. There is also a small gas and fluid collection which appears to be subcapsular overlying the mass which is essentially unchanged compared to the prior study. Previously noted drainage catheters have both been removed. 3. Moderate to large right pleural effusion and small left pleural effusion are both unchanged, with extensive passive atelectasis in the lower lobes of the lungs bilaterally (right greater than left). 4. Trace amount of pericardial fluid and/or thickening, unlikely to be of hemodynamic significance at this time. This finding is unchanged. 5. Additional incidental findings, as above.   Electronically Signed   By: DVinnie LangtonM.D.   On: 08/28/2013 13:07   Dg Chest Port 1 View  08/29/2013   CLINICAL DATA:  Cough  EXAM: PORTABLE CHEST - 1 VIEW  COMPARISON:  08/14/2013  FINDINGS: Cardiac shadow is stable. A left-sided PICC line is noted. It overlies  the midline due to patient rotation to the left. It lies in the superior aspect of the right atrium. Small bilateral pleural effusions are noted. Mild bibasilar atelectasis is noted.  IMPRESSION: Bibasilar changes as described.  Left PICC line with the catheter tip in the proximal right atrium. This could be withdrawn 1-2 cm as necessary.   Electronically Signed   By: Inez Catalina M.D.   On: 08/29/2013 19:29   [place] Image Guided Drainage By Percutaneous Catheter  08/29/2013   CLINICAL DATA:  78 year old male with a history of intra-abdominal and hepatic abscesses status post laparoscopic cholecystectomy. The intra-abdominal abscess is have been treated by percutaneous drainage, however the hepatic abscess continues to enlarge despite IV antibiotic therapy. Percutaneous image guided drainage is indicated.  EXAM: [place] IMAGE GUIDED DRAINAGE BY PERCUTANEOUS CATHETE  Date: 08/29/2013  PROCEDURE: 1.  Ultrasound-guided transhepatic drain placement Interventional Radiologist:  Criselda Peaches, MD  ANESTHESIA/SEDATION: Moderate (conscious) sedation was used. Two mg Versed, 75 mcg Fentanyl were administered intravenously. The patient's vital signs were monitored continuously by radiology nursing throughout the procedure.  Sedation Time: 25 minutes  TECHNIQUE: Informed consent was obtained from the patient following explanation of the procedure, risks, benefits and alternatives. The patient understands, agrees and consents for the procedure. All questions were addressed. A time out was performed.  The right upper quadrant was interrogated with ultrasound. The intrahepatic complex fluid collection was successfully identified. A suitable skin entry site that would allow an avascular transhepatic parenchymal course was selected and marked. Local anesthesia was attained by infiltration with 1% lidocaine a small dermatotomy was made. Under real time sonographic guidance, an 18 gauge trocar needle was carefully advanced through the liver and into the abscess collection. Head Amplatz well a was should coiled within the abscess collection. The tract was dilated to 10 [place] and a [place] all-purpose drain advanced over the wire and positioned with the locking loop centrally in the abscess collection under ultrasound guidance. Images were obtained and stored for the medical record.  Approximately 60 cc of frankly purulent material was aspirated. A sample was sent for culture. The tube was secured to the skin with 0 Prolene suture and a sterile bandage. The tube was left to JP bulb suction. There was no immediate complication, the patient tolerated the procedure well.  IMPRESSION: Successful placement of a 10 French drain into the hepatic abscess. Approximately 60 mL of frankly purulent material was aspirated. A sample was sent for culture.  Maintain tube to gravity drainage. Recommend repeat ultrasound, or CT  evaluation prior to tube removal.  Signed,  Criselda Peaches, MD  [facility]  [facility]   Electronically Signed   By: Jacqulynn Cadet M.D.   On: 08/29/2013 15:21    Anti-infectives: Anti-infectives   Start     Dose/Rate Route Frequency Ordered Stop   08/25/13 1400  piperacillin-tazobactam (ZOSYN) IVPB 3.375 g     3.375 g 12.5 mL/hr over 240 Minutes Intravenous 3 times per day 08/25/13 1359     08/18/13 1645  fluconazole (DIFLUCAN) tablet 100 mg     100 mg Oral Daily 08/18/13 1641     08/18/13 1200  ampicillin (OMNIPEN) 2 g in sodium chloride 0.9 % 50 mL IVPB  Status:  Discontinued    Comments:  Pharmacy may adjust as needed   2 g 150 mL/hr over 20 Minutes Intravenous 4 times per day 08/18/13 1049 08/25/13 1300   08/17/13 1000  fluconazole (DIFLUCAN)  IVPB 100 mg  Status:  Discontinued     100 mg 50 mL/hr over 60 Minutes Intravenous Every 24 hours 08/17/13 0936 08/18/13 1641   08/16/13 1100  vancomycin (VANCOCIN) 500 mg in sodium chloride 0.9 % 100 mL IVPB  Status:  Discontinued     500 mg 100 mL/hr over 60 Minutes Intravenous Every 12 hours 08/16/13 1016 08/18/13 1049   08/15/13 1200  vancomycin (VANCOCIN) 1,250 mg in sodium chloride 0.9 % 250 mL IVPB  Status:  Discontinued     1,250 mg 166.7 mL/hr over 90 Minutes Intravenous Every 24 hours 08/14/13 1911 08/16/13 1016   08/15/13 0000  piperacillin-tazobactam (ZOSYN) IVPB 3.375 g  Status:  Discontinued     3.375 g 12.5 mL/hr over 240 Minutes Intravenous Every 8 hours 08/14/13 1911 08/18/13 1049   08/14/13 1530  vancomycin (VANCOCIN) IVPB 1000 mg/200 mL premix     1,000 mg 200 mL/hr over 60 Minutes Intravenous  Once 08/14/13 1526 08/14/13 1823   08/14/13 1530  piperacillin-tazobactam (ZOSYN) IVPB 3.375 g     3.375 g 100 mL/hr over 30 Minutes Intravenous  Once 08/14/13 1526 08/14/13 1603      Assessment/Plan: Problem List: Patient Active Problem List   Diagnosis Date Noted  .  Tachycardia 08/29/2013  . Cough 08/29/2013  . Monoparesis of leg 08/20/2013  . Right upper quadrant abdominal abscess 08/15/2013  . Hepatic abscess 08/15/2013  . Severe sepsis 08/15/2013  . Subacute delirium 08/14/2013  . Hyperglycemia 08/14/2013  . Oral thrush 08/14/2013  . Leukocytosis, unspecified 08/14/2013  . Debility 08/14/2013  . S/P cholecystectomy 07/29/2013  . Cholecystitis with cholelithiasis 07/25/2013  . Arthritis 06/06/2013  . Headache(784.0) 06/06/2013  . Generalized weakness 05/24/2013  . Cholecystitis, acute 05/24/2013  . RUQ abdominal pain 05/13/2013  . Acute cholecystitis: Probable 05/13/2013  . Constipation 05/13/2013  . Enterococcus UTI 05/13/2013  . Leukocytosis 05/13/2013  . Anemia 05/13/2013  . Stroke 04/15/2012  . Acute left thalamic CVA (cerebral infarction) 04/10/2012  . Diabetes mellitus without complication   . Hypertension   . BPH (benign prostatic hyperplasia)     Will follow with you.  Agree with IR drainage and observation * No surgery found *    LOS: 16 days   Matt B. Hassell Done, MD, [facility] [phone] beeper [phone]  08/30/2013 9:06 AM

## 2013-08-30 NOTE — Progress Notes (Signed)
Pt's CBG = 416. Paged IV team for STAT blood draw. Notified MD. NAD. C/o abd pain, previously medicated.

## 2013-08-30 NOTE — Progress Notes (Addendum)
Progress Note    Mike Mack NLG:[id number] DOB: May 09, 1936 DOA: 08/14/2013 PCP: [name], MD  Brief narrative: 78 y.o. male who presented on 08/14/2013 with a hx of HTN, HLD, prior stroke, recent laparoscopic cholecystectomy 07/25/2013 by [name] (discharged to [facility] 07/30/2013) with several days duration of persistent epigastric abdominal pain associated with fevers, chills, and malaise.   In the ED CT abdomen was notable for abscess in the gallbladder bed, as well as peri-hepatic abscesses. Surgery was consulted and [facility] was asked to admit for further evaluation. Pt started on Vancomycin and Zosyn in ED.   Assessment/Plan: Sinus tachycardia, transient: post procedure. Resolved after saline bolus. D/c tele  Cough: CXR negative  Severe sepsis /  Enterococcus + ?Morganella bacteremia / subhepatic abscess /  epigastric abscess/ liver abscess Continue zosyn. Subhepatic, epigastric abscess drains out, largely resolved. Liver abscess drained yesterday. Still draining. cx pending. Leukocytosis worsened post procedure. monitor  Diabetes mellitus with neuropathy Improved CBGs except lunch. Continue current for now.  Hypertension meds held  H/o Stroke w/ R sided weakness On ASA  Anemia of chronic disease Monitor  Chronic steroid use - no need for stress dose steroids currently - unclear why pt is on chronic steroids (?arthritits) - suggest very slow wean to off as outpt if able   Thrush had sore throat as well which may be consistent with candida esophagitis - sx resolved. D/c diflucan  BPH - Flomax. Still with foley. Voiding trial tomorrow  Deconditioning: SNF when ok with consultants   Code Status: FULL Family Communication: wife at bedside Disposition Plan: SNF   Consultants: Gen Surg  IR ID  Procedures: 2/27 - abdom abscess drain x2 in IR  PICC 3/13 liver abscess drain by IR  Antibiotics: Ampicillin 3/02 >>3/9 Diflucan 3/01 >>  Zosyn 2/26 >>  3/02; restarted 3/9>> Vanc 2/26 >> 3/01  DVT prophylaxis: Lovenox  Subjective: Feels better today. Cough less Objective: Filed Weights   08/28/13 0553 08/29/13 0500 08/30/13 0639  Weight: 55.65 kg (122 lb 11 oz) 56.7 kg (125 lb) 58.469 kg (128 lb 14.4 oz)   Blood pressure 99/49, pulse 93, temperature 98.6 F (37 C), temperature source Oral, resp. rate 16, height 5\' 5"  (1.651 m), weight 58.469 kg (128 lb 14.4 oz), SpO2 100.00%.  Intake/Output Summary (Last 24 hours) at 08/30/13 1322 Last data filed at 08/30/13 1049  Gross per 24 hour  Intake    850 ml  Output    800 ml  Net     50 ml  tele: NSR  Exam: General: more alert. comfortable Lungs: Decreased breath sounds at the bases, no crackles or wheezes Cardiovascular: Regular rate and rhythm without murmur gallop or rub  Abdomen: dressings CDI. Soft, nontender, mild distention. Drain with bloody, purulent drainage Extremities: No significant cyanosis, clubbing, or edema bilateral lower extremities  Data Reviewed: Basic Metabolic Panel:  Recent Labs Lab 08/26/13 0537 08/29/13 2025  NA 136* 134*  K 4.2 4.2  CL 98 98  CO2 29 25  GLUCOSE 123* 128*  BUN 11 15  CREATININE 0.95 1.10  CALCIUM 8.3* 8.6  MG  --  1.7   Liver Function Tests: No results found for this basename: AST, ALT, ALKPHOS, BILITOT, PROT, ALBUMIN,  in the last 168 hours CBC:  Recent Labs Lab 08/24/13 0457 08/25/13 0450 08/26/13 0537 08/27/13 0435 08/29/13 2025  WBC 8.2 8.9 8.6 8.8 22.2*  NEUTROABS 6.2  --   --   --  21.1*  HGB 7.1* 7.0*  7.1* 7.3* 8.2*  HCT 21.9* 21.4* 22.0* 22.9* 25.2*  MCV 81.1 80.1 80.6 80.6 81.6  PLT 450* 499* 527* 576* 529*   Cardiac Enzymes: No results found for this basename: CKTOTAL, CKMB, CKMBINDEX, TROPONINI,  in the last 168 hours CBG:  Recent Labs Lab 08/29/13 1207 08/29/13 1726 08/29/13 2129 08/30/13 0732 08/30/13 1148  GLUCAP 154* 105* 176* 164* 416*    Recent Results (from the past 240 hour(s))   CULTURE, ROUTINE-ABSCESS     Status: None   Collection Time    08/29/13  3:01 PM      Result Value Ref Range Status   Specimen Description ABSCESS LIVER   Final   Special Requests NONE   Final   Gram Stain     Final   Value: ABUNDANT WBC PRESENT,BOTH PMN AND MONONUCLEAR     NO SQUAMOUS EPITHELIAL CELLS SEEN     NO ORGANISMS SEEN     Performed at [facility]   Culture     Final   Value: Culture reincubated for better growth     Performed at [facility]   Report Status PENDING   Incomplete     Studies:  Recent x-ray studies have been reviewed in detail by the Attending Physician  Scheduled Meds:  Scheduled Meds: . feeding supplement (ENSURE COMPLETE)  237 mL Oral BID PC  . feeding supplement (ENSURE)  1 Container Oral Q24H  . feeding supplement (GLUCERNA SHAKE)  237 mL Oral Q24H  . fluconazole  100 mg Oral Daily  . insulin aspart  0-20 Units Subcutaneous TID WC  . insulin aspart  0-5 Units Subcutaneous QHS  . insulin glargine  10 Units Subcutaneous QHS  . latanoprost  1 drop Both Eyes QHS  . multivitamin with minerals  1 tablet Oral Daily  . nystatin  5 mL Oral QID  . piperacillin-tazobactam (ZOSYN)  IV  3.375 g Intravenous 3 times per day  . polyethylene glycol  17 g Oral TID  . predniSONE  5 mg Oral Q breakfast  . tamsulosin  0.4 mg Oral BID   Time spent on care of this patient: 25 min  Mike Redwood, MD [facility]  08/30/2013, 1:22 PM   LOS: 16 days

## 2013-08-30 NOTE — Progress Notes (Signed)
Subjective: Hepatic abscess drain placed 3/13 Feels better today  Objective: Vital signs in last 24 hours: Temp:  [98.1 F (36.7 C)-99.3 F (37.4 C)] 98.6 F (37 C) (03/14 0945) Pulse Rate:  [74-151] 93 (03/14 0945) Resp:  [11-22] 16 (03/14 0945) BP: (84-174)/(37-76) 99/49 mmHg (03/14 0945) SpO2:  [94 %-100 %] 100 % (03/14 0945) Weight:  [58.469 kg (128 lb 14.4 oz)] 58.469 kg (128 lb 14.4 oz) (03/14 0639) Last BM Date: 08/29/13  Intake/Output from previous day: 03/13 0701 - 03/14 0700 In: 370 [P.O.:360] Out: 1175 [Urine:1150; Drains:25] Intake/Output this shift: Total I/O In: 480 [P.O.:480] Out: -   PE:  Afeb; vss pleasant Output 25 cc yesterday; 10 cc in JP Bloody  Site clean and dry; sl tender Cx: no orgs seen  Lab Results:   Recent Labs  08/29/13 2025  WBC 22.2*  HGB 8.2*  HCT 25.2*  PLT 529*   BMET  Recent Labs  08/29/13 2025  NA 134*  K 4.2  CL 98  CO2 25  GLUCOSE 128*  BUN 15  CREATININE 1.10  CALCIUM 8.6   PT/INR  Recent Labs  08/29/13 1100  LABPROT 14.9  INR 1.20   ABG No results found for this basename: PHART, PCO2, PO2, HCO3,  in the last 72 hours  Studies/Results: Ct Abdomen W Contrast  08/28/2013   CLINICAL DATA:  Liver abscess.  Evaluate for progression.  EXAM: CT ABDOMEN WITH CONTRAST  TECHNIQUE: Multidetector CT imaging of the abdomen was performed using the standard protocol following bolus administration of intravenous contrast.  CONTRAST:  106mL OMNIPAQUE IOHEXOL 300 MG/ML  SOLN  COMPARISON:  CT of the abdomen and pelvis 08/22/2013.  FINDINGS: Lung Bases: Incompletely visualized right pleural effusion which is moderate to large. Small left pleural effusion. Passive atelectasis in the visualized portions of the lung bases bilaterally. Tip of a central venous catheter is noted in the superior aspect of the right atrium. Trace amount of pericardial fluid and/or thickening, unchanged, and unlikely to be of hemodynamic significance  at this time.  Abdomen: Complex rim enhancing fluid collection measuring approximately 6.7 x 4.8 x 5.9 cm in the inferior aspect of the right lobe of the liver appears slightly larger than the prior examination from 08/22/2013. There is again a more peripheral portion of the collection in the anterior aspect of the liver (predominantly segment 4B), where there was previously a drainage catheter which has since been removed. The smaller portion of the collection measures approximately 4.9 x 1.7 cm (image 22 of series 2) and contain several internal locular of gas. Immediately adjacent to this overlying the anterior surface of the liver adjacent to segment 4A there is a small gas and fluid containing collection which is favored to be subcapsular (rather than pneumoperitoneum) which is essentially unchanged compared to the prior study, currently measuring 4.2 x 0.9 cm. No new hepatic collections are noted. Status post cholecystectomy. No intrahepatic biliary ductal dilatation. Other previously noted drainage catheter in the epigastric region has been removed.  The appearance of the pancreas, spleen and bilateral adrenal glands is unremarkable. 3 mm nonobstructive calculus in the lower pole collecting system of the right kidney. Multiple low-attenuation lesions in the kidneys bilaterally, many of which are too small to definitively characterize. The largest of these lesions are all compatible with simple cysts, with the largest of these measuring 2.3 cm in diameter in the lower pole of the left kidney. Within the visualized portions of the peritoneal cavity there is no  significant volume of ascites and no definite pneumoperitoneum. No pathologic distention of small bowel. No lymphadenopathy.  Musculoskeletal: There are no aggressive appearing lytic or blastic lesions noted in the visualized portions of the skeleton.  IMPRESSION: 1. Interval enlargement of a complex multilocular rim enhancing fluid collection in the  inferior aspect of the right lobe of the liver, compatible with progressive enlargement of an intrahepatic abscess. 2. Previously noted peripheral abscess in segment 4B at site of prior drain placement is again noted, currently measuring 1.7 x 4.9 cm with several tiny locules of internal gas. There is also a small gas and fluid collection which appears to be subcapsular overlying the mass which is essentially unchanged compared to the prior study. Previously noted drainage catheters have both been removed. 3. Moderate to large right pleural effusion and small left pleural effusion are both unchanged, with extensive passive atelectasis in the lower lobes of the lungs bilaterally (right greater than left). 4. Trace amount of pericardial fluid and/or thickening, unlikely to be of hemodynamic significance at this time. This finding is unchanged. 5. Additional incidental findings, as above.   Electronically Signed   By: Vinnie Langton M.D.   On: 08/28/2013 13:07   Dg Chest Port 1 View  08/29/2013   CLINICAL DATA:  Cough  EXAM: PORTABLE CHEST - 1 VIEW  COMPARISON:  08/14/2013  FINDINGS: Cardiac shadow is stable. A left-sided PICC line is noted. It overlies the midline due to patient rotation to the left. It lies in the superior aspect of the right atrium. Small bilateral pleural effusions are noted. Mild bibasilar atelectasis is noted.  IMPRESSION: Bibasilar changes as described.  Left PICC line with the catheter tip in the proximal right atrium. This could be withdrawn 1-2 cm as necessary.   Electronically Signed   By: Inez Catalina M.D.   On: 08/29/2013 19:29   [place] Image Guided Drainage By Percutaneous Catheter  08/29/2013   CLINICAL DATA:  78 year old male with a history of intra-abdominal and hepatic abscesses status post laparoscopic cholecystectomy. The intra-abdominal abscess is have been treated by percutaneous drainage, however the hepatic abscess continues to enlarge despite IV antibiotic therapy.  Percutaneous image guided drainage is indicated.  EXAM: [place] IMAGE GUIDED DRAINAGE BY PERCUTANEOUS CATHETE  Date: 08/29/2013  PROCEDURE: 1. Ultrasound-guided transhepatic drain placement Interventional Radiologist:  Criselda Peaches, MD  ANESTHESIA/SEDATION: Moderate (conscious) sedation was used. Two mg Versed, 75 mcg Fentanyl were administered intravenously. The patient's vital signs were monitored continuously by radiology nursing throughout the procedure.  Sedation Time: 25 minutes  TECHNIQUE: Informed consent was obtained from the patient following explanation of the procedure, risks, benefits and alternatives. The patient understands, agrees and consents for the procedure. All questions were addressed. A time out was performed.  The right upper quadrant was interrogated with ultrasound. The intrahepatic complex fluid collection was successfully identified. A suitable skin entry site that would allow an avascular transhepatic parenchymal course was selected and marked. Local anesthesia was attained by infiltration with 1% lidocaine a small dermatotomy was made. Under real time sonographic guidance, an 18 gauge trocar needle was carefully advanced through the liver and into the abscess collection. Head Amplatz well a was should coiled within the abscess collection. The tract was dilated to 10 [place] and a [place] all-purpose drain advanced over the wire and positioned with the locking loop centrally in the abscess collection under ultrasound guidance. Images were obtained and stored for the medical record.  Approximately 60 cc of frankly  purulent material was aspirated. A sample was sent for culture. The tube was secured to the skin with 0 Prolene suture and a sterile bandage. The tube was left to JP bulb suction. There was no immediate complication, the patient tolerated the procedure well.  IMPRESSION: Successful placement of a 10 French drain into the hepatic abscess. Approximately 60 mL of frankly  purulent material was aspirated. A sample was sent for culture.  Maintain tube to gravity drainage. Recommend repeat ultrasound, or CT evaluation prior to tube removal.  Signed,  Criselda Peaches, MD  [facility]  [facility]   Electronically Signed   By: Jacqulynn Cadet M.D.   On: 08/29/2013 15:21    Anti-infectives: Anti-infectives   Start     Dose/Rate Route Frequency Ordered Stop   08/25/13 1400  piperacillin-tazobactam (ZOSYN) IVPB 3.375 g     3.375 g 12.5 mL/hr over 240 Minutes Intravenous 3 times per day 08/25/13 1359     08/18/13 1645  fluconazole (DIFLUCAN) tablet 100 mg     100 mg Oral Daily 08/18/13 1641     08/18/13 1200  ampicillin (OMNIPEN) 2 g in sodium chloride 0.9 % 50 mL IVPB  Status:  Discontinued    Comments:  Pharmacy may adjust as needed   2 g 150 mL/hr over 20 Minutes Intravenous 4 times per day 08/18/13 1049 08/25/13 1300   08/17/13 1000  fluconazole (DIFLUCAN) IVPB 100 mg  Status:  Discontinued     100 mg 50 mL/hr over 60 Minutes Intravenous Every 24 hours 08/17/13 0936 08/18/13 1641   08/16/13 1100  vancomycin (VANCOCIN) 500 mg in sodium chloride 0.9 % 100 mL IVPB  Status:  Discontinued     500 mg 100 mL/hr over 60 Minutes Intravenous Every 12 hours 08/16/13 1016 08/18/13 1049   08/15/13 1200  vancomycin (VANCOCIN) 1,250 mg in sodium chloride 0.9 % 250 mL IVPB  Status:  Discontinued     1,250 mg 166.7 mL/hr over 90 Minutes Intravenous Every 24 hours 08/14/13 1911 08/16/13 1016   08/15/13 0000  piperacillin-tazobactam (ZOSYN) IVPB 3.375 g  Status:  Discontinued     3.375 g 12.5 mL/hr over 240 Minutes Intravenous Every 8 hours 08/14/13 1911 08/18/13 1049   08/14/13 1530  vancomycin (VANCOCIN) IVPB 1000 mg/200 mL premix     1,000 mg 200 mL/hr over 60 Minutes Intravenous  Once 08/14/13 1526 08/14/13 1823   08/14/13 1530  piperacillin-tazobactam (ZOSYN) IVPB 3.375 g     3.375 g 100 mL/hr over 30 Minutes Intravenous   Once 08/14/13 1526 08/14/13 1603      Assessment/Plan: s/p * No surgery found *  Hep abscess Drain intact Will follow   LOS: 16 days    Prajna Vanderpool A 08/30/2013

## 2013-08-31 LAB — COMPREHENSIVE METABOLIC PANEL
ALT: 57 U/L — ABNORMAL HIGH (ref 0–53)
AST: 29 U/L (ref 0–37)
Albumin: 1.7 g/dL — ABNORMAL LOW (ref 3.5–5.2)
Alkaline Phosphatase: 213 U/L — ABNORMAL HIGH (ref 39–117)
BUN: 25 mg/dL — ABNORMAL HIGH (ref 6–23)
CALCIUM: 8.3 mg/dL — AB (ref 8.4–10.5)
CHLORIDE: 97 meq/L (ref 96–112)
CO2: 27 mEq/L (ref 19–32)
CREATININE: 1.04 mg/dL (ref 0.50–1.35)
GFR calc Af Amer: 79 mL/min — ABNORMAL LOW (ref >90)
GFR calc non Af Amer: 68 mL/min — ABNORMAL LOW (ref >90)
Glucose, Bld: 200 mg/dL — ABNORMAL HIGH (ref 70–99)
Potassium: 4.5 mEq/L (ref 3.7–5.3)
Sodium: 133 mEq/L — ABNORMAL LOW (ref 137–147)
TOTAL PROTEIN: 6.5 g/dL (ref 6.0–8.3)
Total Bilirubin: 0.2 mg/dL — ABNORMAL LOW (ref 0.3–1.2)

## 2013-08-31 LAB — CBC WITH DIFFERENTIAL/PLATELET
BASOS ABS: 0 10*3/uL (ref 0.0–0.1)
Basophils Relative: 0 % (ref 0–1)
EOS PCT: 0 % (ref 0–5)
Eosinophils Absolute: 0.1 10*3/uL (ref 0.0–0.7)
HCT: 22 % — ABNORMAL LOW (ref 39.0–52.0)
Hemoglobin: 7 g/dL — ABNORMAL LOW (ref 13.0–17.0)
Lymphocytes Relative: 7 % — ABNORMAL LOW (ref 12–46)
Lymphs Abs: 1.4 10*3/uL (ref 0.7–4.0)
MCH: 26 pg (ref 26.0–34.0)
MCHC: 31.8 g/dL (ref 30.0–36.0)
MCV: 81.8 fL (ref 78.0–100.0)
Monocytes Absolute: 1.1 10*3/uL — ABNORMAL HIGH (ref 0.1–1.0)
Monocytes Relative: 6 % (ref 3–12)
NEUTROS ABS: 17 10*3/uL — AB (ref 1.7–7.7)
Neutrophils Relative %: 87 % — ABNORMAL HIGH (ref 43–77)
Platelets: 397 10*3/uL (ref 150–400)
RBC: 2.69 MIL/uL — ABNORMAL LOW (ref 4.22–5.81)
RDW: 19.4 % — AB (ref 11.5–15.5)
WBC: 19.5 10*3/uL — ABNORMAL HIGH (ref 4.0–10.5)

## 2013-08-31 LAB — GLUCOSE, CAPILLARY
GLUCOSE-CAPILLARY: 288 mg/dL — AB (ref 70–99)
Glucose-Capillary: 173 mg/dL — ABNORMAL HIGH (ref 70–99)
Glucose-Capillary: 284 mg/dL — ABNORMAL HIGH (ref 70–99)
Glucose-Capillary: 267 mg/dL — ABNORMAL HIGH (ref 70–99)

## 2013-08-31 NOTE — Progress Notes (Signed)
Patient ID: Mike Mack, male   DOB: [birth year], 78 y.o.   MRN: [id number] [facility] Surgery Progress Note:   * No surgery found *  Subjective: Mental status is responding althought slow.  Still complaining of some right upper quadrant pain.   Objective: Vital signs in last 24 hours: Temp:  [97.7 F (36.5 C)-98.9 F (37.2 C)] 98.9 F (37.2 C) (03/15 0608) Pulse Rate:  [86-100] 86 (03/15 0608) Resp:  [16-18] 16 (03/15 0608) BP: (99-114)/(48-59) 114/59 mmHg (03/15 0608) SpO2:  [98 %-100 %] 98 % (03/15 0608) Weight:  [127 lb 8 oz (57.834 kg)] 127 lb 8 oz (57.834 kg) (03/15 9024)  Intake/Output from previous day: 03/14 0701 - 03/15 0700 In: 1033 [P.O.:900; IV Piggyback:128] Out: 712 [Urine:700; Drains:12] Intake/Output this shift:    Physical Exam: Work of breathing is not labored.  Cultures on liver abscess drain are still pending.    Lab Results:  Results for orders placed during the hospital encounter of 08/14/13 (from the past 48 hour(s))  PROTIME-INR     Status: None   Collection Time    08/29/13 11:00 AM      Result Value Ref Range   Prothrombin Time 14.9  11.6 - 15.2 seconds   INR 1.20  0.00 - 1.49  APTT     Status: None   Collection Time    08/29/13 11:00 AM      Result Value Ref Range   aPTT 32  24 - 37 seconds  GLUCOSE, CAPILLARY     Status: Abnormal   Collection Time    08/29/13 12:07 PM      Result Value Ref Range   Glucose-Capillary 154 (*) 70 - 99 mg/dL   Comment 1 Notify RN    CULTURE, ROUTINE-ABSCESS     Status: None   Collection Time    08/29/13  3:01 PM      Result Value Ref Range   Specimen Description ABSCESS LIVER     Special Requests NONE     Gram Stain       Value: ABUNDANT WBC PRESENT,BOTH PMN AND MONONUCLEAR     NO SQUAMOUS EPITHELIAL CELLS SEEN     NO ORGANISMS SEEN     Performed at [facility]   Culture       Value: ABUNDANT GRAM NEGATIVE RODS     Performed at [facility]   Report Status PENDING    GLUCOSE,  CAPILLARY     Status: Abnormal   Collection Time    08/29/13  5:26 PM      Result Value Ref Range   Glucose-Capillary 105 (*) 70 - 99 mg/dL  CBC WITH DIFFERENTIAL     Status: Abnormal   Collection Time    08/29/13  8:25 PM      Result Value Ref Range   WBC 22.2 (*) 4.0 - 10.5 K/uL   RBC 3.09 (*) 4.22 - 5.81 MIL/uL   Hemoglobin 8.2 (*) 13.0 - 17.0 g/dL   HCT 25.2 (*) 39.0 - 52.0 %   MCV 81.6  78.0 - 100.0 fL   MCH 26.5  26.0 - 34.0 pg   MCHC 32.5  30.0 - 36.0 g/dL   RDW 18.8 (*) 11.5 - 15.5 %   Platelets 529 (*) 150 - 400 K/uL   Neutrophils Relative % 95 (*) 43 - 77 %   Neutro Abs 21.1 (*) 1.7 - 7.7 K/uL   Lymphocytes Relative 2 (*) 12 - 46 %  Lymphs Abs 0.4 (*) 0.7 - 4.0 K/uL   Monocytes Relative 3  3 - 12 %   Monocytes Absolute 0.7  0.1 - 1.0 K/uL   Eosinophils Relative 0  0 - 5 %   Eosinophils Absolute 0.0  0.0 - 0.7 K/uL   Basophils Relative 0  0 - 1 %   Basophils Absolute 0.0  0.0 - 0.1 K/uL  BASIC METABOLIC PANEL     Status: Abnormal   Collection Time    08/29/13  8:25 PM      Result Value Ref Range   Sodium 134 (*) 137 - 147 mEq/L   Potassium 4.2  3.7 - 5.3 mEq/L   Chloride 98  96 - 112 mEq/L   CO2 25  19 - 32 mEq/L   Glucose, Bld 128 (*) 70 - 99 mg/dL   BUN 15  6 - 23 mg/dL   Creatinine, Ser 1.10  0.50 - 1.35 mg/dL   Calcium 8.6  8.4 - 10.5 mg/dL   GFR calc non Af Amer 64 (*) >90 mL/min   GFR calc Af Amer 74 (*) >90 mL/min   Comment: (NOTE)     The eGFR has been calculated using the CKD EPI equation.     This calculation has not been validated in all clinical situations.     eGFR's persistently <90 mL/min signify possible Chronic Kidney     Disease.  MAGNESIUM     Status: None   Collection Time    08/29/13  8:25 PM      Result Value Ref Range   Magnesium 1.7  1.5 - 2.5 mg/dL  GLUCOSE, CAPILLARY     Status: Abnormal   Collection Time    08/29/13  9:29 PM      Result Value Ref Range   Glucose-Capillary 176 (*) 70 - 99 mg/dL  GLUCOSE, CAPILLARY      Status: Abnormal   Collection Time    08/30/13  7:32 AM      Result Value Ref Range   Glucose-Capillary 164 (*) 70 - 99 mg/dL   Comment 1 Notify RN    GLUCOSE, CAPILLARY     Status: Abnormal   Collection Time    08/30/13 11:48 AM      Result Value Ref Range   Glucose-Capillary 416 (*) 70 - 99 mg/dL   Comment 1 Notify RN    GLUCOSE, RANDOM     Status: Abnormal   Collection Time    08/30/13 12:15 PM      Result Value Ref Range   Glucose, Bld 498 (*) 70 - 99 mg/dL  GLUCOSE, CAPILLARY     Status: Abnormal   Collection Time    08/30/13  2:27 PM      Result Value Ref Range   Glucose-Capillary 463 (*) 70 - 99 mg/dL  GLUCOSE, CAPILLARY     Status: Abnormal   Collection Time    08/30/13  5:50 PM      Result Value Ref Range   Glucose-Capillary 403 (*) 70 - 99 mg/dL   Comment 1 Notify RN    GLUCOSE, CAPILLARY     Status: Abnormal   Collection Time    08/30/13  9:54 PM      Result Value Ref Range   Glucose-Capillary 191 (*) 70 - 99 mg/dL   Comment 1 Notify RN     Comment 2 Documented in Chart    CBC WITH DIFFERENTIAL     Status: Abnormal   Collection Time  08/31/13  5:42 AM      Result Value Ref Range   WBC 19.5 (*) 4.0 - 10.5 K/uL   RBC 2.69 (*) 4.22 - 5.81 MIL/uL   Hemoglobin 7.0 (*) 13.0 - 17.0 g/dL   HCT 22.0 (*) 39.0 - 52.0 %   MCV 81.8  78.0 - 100.0 fL   MCH 26.0  26.0 - 34.0 pg   MCHC 31.8  30.0 - 36.0 g/dL   RDW 19.4 (*) 11.5 - 15.5 %   Platelets 397  150 - 400 K/uL   Comment: DELTA CHECK NOTED     REPEATED TO VERIFY   Neutrophils Relative % 87 (*) 43 - 77 %   Neutro Abs 17.0 (*) 1.7 - 7.7 K/uL   Lymphocytes Relative 7 (*) 12 - 46 %   Lymphs Abs 1.4  0.7 - 4.0 K/uL   Monocytes Relative 6  3 - 12 %   Monocytes Absolute 1.1 (*) 0.1 - 1.0 K/uL   Eosinophils Relative 0  0 - 5 %   Eosinophils Absolute 0.1  0.0 - 0.7 K/uL   Basophils Relative 0  0 - 1 %   Basophils Absolute 0.0  0.0 - 0.1 K/uL  COMPREHENSIVE METABOLIC PANEL     Status: Abnormal   Collection Time     08/31/13  5:42 AM      Result Value Ref Range   Sodium 133 (*) 137 - 147 mEq/L   Potassium 4.5  3.7 - 5.3 mEq/L   Chloride 97  96 - 112 mEq/L   CO2 27  19 - 32 mEq/L   Glucose, Bld 200 (*) 70 - 99 mg/dL   BUN 25 (*) 6 - 23 mg/dL   Creatinine, Ser 1.04  0.50 - 1.35 mg/dL   Calcium 8.3 (*) 8.4 - 10.5 mg/dL   Total Protein 6.5  6.0 - 8.3 g/dL   Albumin 1.7 (*) 3.5 - 5.2 g/dL   AST 29  0 - 37 U/L   ALT 57 (*) 0 - 53 U/L   Alkaline Phosphatase 213 (*) 39 - 117 U/L   Total Bilirubin 0.2 (*) 0.3 - 1.2 mg/dL   GFR calc non Af Amer 68 (*) >90 mL/min   GFR calc Af Amer 79 (*) >90 mL/min   Comment: (NOTE)     The eGFR has been calculated using the CKD EPI equation.     This calculation has not been validated in all clinical situations.     eGFR's persistently <90 mL/min signify possible Chronic Kidney     Disease.    Radiology/Results: Dg Chest Port 1 View  08/29/2013   CLINICAL DATA:  Cough  EXAM: PORTABLE CHEST - 1 VIEW  COMPARISON:  08/14/2013  FINDINGS: Cardiac shadow is stable. A left-sided PICC line is noted. It overlies the midline due to patient rotation to the left. It lies in the superior aspect of the right atrium. Small bilateral pleural effusions are noted. Mild bibasilar atelectasis is noted.  IMPRESSION: Bibasilar changes as described.  Left PICC line with the catheter tip in the proximal right atrium. This could be withdrawn 1-2 cm as necessary.   Electronically Signed   By: Inez Catalina M.D.   On: 08/29/2013 19:29   [place] Image Guided Drainage By Percutaneous Catheter  08/29/2013   CLINICAL DATA:  78 year old male with a history of intra-abdominal and hepatic abscesses status post laparoscopic cholecystectomy. The intra-abdominal abscess is have been treated by percutaneous drainage, however the hepatic abscess continues  to enlarge despite IV antibiotic therapy. Percutaneous image guided drainage is indicated.  EXAM: [place] IMAGE GUIDED DRAINAGE BY PERCUTANEOUS CATHETE  Date:  08/29/2013  PROCEDURE: 1. Ultrasound-guided transhepatic drain placement Interventional Radiologist:  Criselda Peaches, MD  ANESTHESIA/SEDATION: Moderate (conscious) sedation was used. Two mg Versed, 75 mcg Fentanyl were administered intravenously. The patient's vital signs were monitored continuously by radiology nursing throughout the procedure.  Sedation Time: 25 minutes  TECHNIQUE: Informed consent was obtained from the patient following explanation of the procedure, risks, benefits and alternatives. The patient understands, agrees and consents for the procedure. All questions were addressed. A time out was performed.  The right upper quadrant was interrogated with ultrasound. The intrahepatic complex fluid collection was successfully identified. A suitable skin entry site that would allow an avascular transhepatic parenchymal course was selected and marked. Local anesthesia was attained by infiltration with 1% lidocaine a small dermatotomy was made. Under real time sonographic guidance, an 18 gauge trocar needle was carefully advanced through the liver and into the abscess collection. Head Amplatz well a was should coiled within the abscess collection. The tract was dilated to 10 [place] and a [place] all-purpose drain advanced over the wire and positioned with the locking loop centrally in the abscess collection under ultrasound guidance. Images were obtained and stored for the medical record.  Approximately 60 cc of frankly purulent material was aspirated. A sample was sent for culture. The tube was secured to the skin with 0 Prolene suture and a sterile bandage. The tube was left to JP bulb suction. There was no immediate complication, the patient tolerated the procedure well.  IMPRESSION: Successful placement of a 10 French drain into the hepatic abscess. Approximately 60 mL of frankly purulent material was aspirated. A sample was sent for culture.  Maintain tube to gravity drainage. Recommend  repeat ultrasound, or CT evaluation prior to tube removal.  Signed,  Criselda Peaches, MD  [facility]  [facility]   Electronically Signed   By: Jacqulynn Cadet M.D.   On: 08/29/2013 15:21    Anti-infectives: Anti-infectives   Start     Dose/Rate Route Frequency Ordered Stop   08/25/13 1400  piperacillin-tazobactam (ZOSYN) IVPB 3.375 g     3.375 g 12.5 mL/hr over 240 Minutes Intravenous 3 times per day 08/25/13 1359     08/18/13 1645  fluconazole (DIFLUCAN) tablet 100 mg  Status:  Discontinued     100 mg Oral Daily 08/18/13 1641 08/30/13 1334   08/18/13 1200  ampicillin (OMNIPEN) 2 g in sodium chloride 0.9 % 50 mL IVPB  Status:  Discontinued    Comments:  Pharmacy may adjust as needed   2 g 150 mL/hr over 20 Minutes Intravenous 4 times per day 08/18/13 1049 08/25/13 1300   08/17/13 1000  fluconazole (DIFLUCAN) IVPB 100 mg  Status:  Discontinued     100 mg 50 mL/hr over 60 Minutes Intravenous Every 24 hours 08/17/13 0936 08/18/13 1641   08/16/13 1100  vancomycin (VANCOCIN) 500 mg in sodium chloride 0.9 % 100 mL IVPB  Status:  Discontinued     500 mg 100 mL/hr over 60 Minutes Intravenous Every 12 hours 08/16/13 1016 08/18/13 1049   08/15/13 1200  vancomycin (VANCOCIN) 1,250 mg in sodium chloride 0.9 % 250 mL IVPB  Status:  Discontinued     1,250 mg 166.7 mL/hr over 90 Minutes Intravenous Every 24 hours 08/14/13 1911 08/16/13 1016   08/15/13 0000  piperacillin-tazobactam (ZOSYN) IVPB  3.375 g  Status:  Discontinued     3.375 g 12.5 mL/hr over 240 Minutes Intravenous Every 8 hours 08/14/13 1911 08/18/13 1049   08/14/13 1530  vancomycin (VANCOCIN) IVPB 1000 mg/200 mL premix     1,000 mg 200 mL/hr over 60 Minutes Intravenous  Once 08/14/13 1526 08/14/13 1823   08/14/13 1530  piperacillin-tazobactam (ZOSYN) IVPB 3.375 g     3.375 g 100 mL/hr over 30 Minutes Intravenous  Once 08/14/13 1526 08/14/13 1603      Assessment/Plan: Problem  List: Patient Active Problem List   Diagnosis Date Noted  . Tachycardia 08/29/2013  . Cough 08/29/2013  . Monoparesis of leg 08/20/2013  . Right upper quadrant abdominal abscess 08/15/2013  . Hepatic abscess 08/15/2013  . Severe sepsis 08/15/2013  . Subacute delirium 08/14/2013  . Hyperglycemia 08/14/2013  . Oral thrush 08/14/2013  . Leukocytosis, unspecified 08/14/2013  . Debility 08/14/2013  . S/P cholecystectomy 07/29/2013  . Cholecystitis with cholelithiasis 07/25/2013  . Arthritis 06/06/2013  . Headache(784.0) 06/06/2013  . Generalized weakness 05/24/2013  . Cholecystitis, acute 05/24/2013  . RUQ abdominal pain 05/13/2013  . Acute cholecystitis: Probable 05/13/2013  . Constipation 05/13/2013  . Enterococcus UTI 05/13/2013  . Leukocytosis 05/13/2013  . Anemia 05/13/2013  . Stroke 04/15/2012  . Acute left thalamic CVA (cerebral infarction) 04/10/2012  . Diabetes mellitus without complication   . Hypertension   . BPH (benign prostatic hyperplasia)     Would have drains removed prior to transfer back to SNF.   * No surgery found *    LOS: 17 days   Matt B. Hassell Done, MD, [facility] [phone] beeper [phone]  08/31/2013 7:54 AM

## 2013-08-31 NOTE — Progress Notes (Signed)
Progress Note    Mike Mack VZC:[id number] DOB: Feb 06, 1936 DOA: 08/14/2013 PCP: [name], MD  Brief narrative: 78 y.o. male who presented on 08/14/2013 with a hx of HTN, HLD, prior stroke, recent laparoscopic cholecystectomy 07/25/2013 by [name] (discharged to [facility] 07/30/2013) with several days duration of persistent epigastric abdominal pain associated with fevers, chills, and malaise.   In the ED CT abdomen was notable for abscess in the gallbladder bed, as well as peri-hepatic abscesses. Surgery was consulted and TRH was asked to admit for further evaluation. Pt started on Vancomycin and Zosyn in ED.   Assessment/Plan: Sinus tachycardia, transient: post procedure. Resolved after saline bolus. D/c tele  Cough: CXR negative  Severe sepsis /  Enterococcus + ?Morganella bacteremia / subhepatic abscess /  epigastric abscess/ liver abscess Continue zosyn. Subhepatic, epigastric abscess drains out, largely resolved. Liver abscess drained yesterday. Still draining. cx pending. Leukocytosis worsened post procedure. monitor  Diabetes mellitus with neuropathy Improved CBGs except lunch. Continue current for now.  Hypertension meds held  H/o Stroke w/ R sided weakness On ASA  Anemia of chronic disease Monitor  Chronic steroid use - no need for stress dose steroids currently - unclear why pt is on chronic steroids (?arthritits) - suggest very slow wean to off as outpt if able   Thrush had sore throat as well which may be consistent with candida esophagitis - sx resolved. D/c diflucan  BPH - Flomax. Still with foley. Voiding trial tomorrow  Deconditioning: SNF when ok with consultants   Code Status: FULL Family Communication: wife at bedside Disposition Plan: SNF   Consultants: Gen Surg  IR ID  Procedures: 2/27 - abdom abscess drain x2 in IR  PICC 3/13 liver abscess drain by IR  Antibiotics: Ampicillin 3/02 >>3/9 Diflucan 3/01 >>  Zosyn 2/26 >>  3/02; restarted 3/9>> Vanc 2/26 >> 3/01  DVT prophylaxis: Lovenox  Subjective:  no event overnight No SOB No CP   Objective: Filed Weights   08/29/13 0500 08/30/13 0639 08/31/13 0608  Weight: 56.7 kg (125 lb) 58.469 kg (128 lb 14.4 oz) 57.834 kg (127 lb 8 oz)   Blood pressure 114/59, pulse 86, temperature 98.9 F (37.2 C), temperature source Oral, resp. rate 16, height 5\' 5"  (1.651 m), weight 57.834 kg (127 lb 8 oz), SpO2 98.00%.  Intake/Output Summary (Last 24 hours) at 08/31/13 0917 Last data filed at 08/31/13 0610  Gross per 24 hour  Intake    793 ml  Output    712 ml  Net     81 ml  tele: NSR  Exam: General: more alert. comfortable Lungs: Decreased breath sounds at the bases, no crackles or wheezes Cardiovascular: Regular rate and rhythm without murmur gallop or rub  Abdomen: dressings CDI. Soft, nontender, mild distention. Drain with bloody, purulent drainage Extremities: No significant cyanosis, clubbing, or edema bilateral lower extremities  Data Reviewed: Basic Metabolic Panel:  Recent Labs Lab 08/26/13 0537 08/29/13 2025 08/30/13 1215 08/31/13 0542  NA 136* 134*  --  133*  K 4.2 4.2  --  4.5  CL 98 98  --  97  CO2 29 25  --  27  GLUCOSE 123* 128* 498* 200*  BUN 11 15  --  25*  CREATININE 0.95 1.10  --  1.04  CALCIUM 8.3* 8.6  --  8.3*  MG  --  1.7  --   --    Liver Function Tests:  Recent Labs Lab 08/31/13 0542  AST 29  ALT 57*  ALKPHOS 213*  BILITOT 0.2*  PROT 6.5  ALBUMIN 1.7*   CBC:  Recent Labs Lab 08/25/13 0450 08/26/13 0537 08/27/13 0435 08/29/13 2025 08/31/13 0542  WBC 8.9 8.6 8.8 22.2* 19.5*  NEUTROABS  --   --   --  21.1* 17.0*  HGB 7.0* 7.1* 7.3* 8.2* 7.0*  HCT 21.4* 22.0* 22.9* 25.2* 22.0*  MCV 80.1 80.6 80.6 81.6 81.8  PLT 499* 527* 576* 529* 397   Cardiac Enzymes: No results found for this basename: CKTOTAL, CKMB, CKMBINDEX, TROPONINI,  in the last 168 hours CBG:  Recent Labs Lab 08/30/13 1148 08/30/13 1427  08/30/13 1750 08/30/13 2154 08/31/13 0748  GLUCAP 416* 463* 403* 191* 173*    Recent Results (from the past 240 hour(s))  CULTURE, ROUTINE-ABSCESS     Status: None   Collection Time    08/29/13  3:01 PM      Result Value Ref Range Status   Specimen Description ABSCESS LIVER   Final   Special Requests NONE   Final   Gram Stain     Final   Value: ABUNDANT WBC PRESENT,BOTH PMN AND MONONUCLEAR     NO SQUAMOUS EPITHELIAL CELLS SEEN     NO ORGANISMS SEEN     Performed at [facility]   Culture     Final   Value: ABUNDANT GRAM NEGATIVE RODS     Performed at [facility]   Report Status PENDING   Incomplete     Studies:  Recent x-ray studies have been reviewed in detail by the Attending Physician  Scheduled Meds:  Scheduled Meds: . feeding supplement (ENSURE COMPLETE)  237 mL Oral BID PC  . feeding supplement (ENSURE)  1 Container Oral Q24H  . feeding supplement (GLUCERNA SHAKE)  237 mL Oral Q24H  . insulin aspart  0-20 Units Subcutaneous TID WC  . insulin aspart  0-5 Units Subcutaneous QHS  . insulin glargine  10 Units Subcutaneous QHS  . latanoprost  1 drop Both Eyes QHS  . multivitamin with minerals  1 tablet Oral Daily  . piperacillin-tazobactam (ZOSYN)  IV  3.375 g Intravenous 3 times per day  . polyethylene glycol  17 g Oral Daily  . predniSONE  5 mg Oral Q breakfast  . tamsulosin  0.4 mg Oral BID   Time spent on care of this patient: 25 min  Karsten Howry, [place], DO [place] [facility]  08/31/2013, 9:17 AM   LOS: 17 days

## 2013-09-01 LAB — GLUCOSE, CAPILLARY
GLUCOSE-CAPILLARY: 155 mg/dL — AB (ref 70–99)
Glucose-Capillary: 299 mg/dL — ABNORMAL HIGH (ref 70–99)
Glucose-Capillary: 206 mg/dL — ABNORMAL HIGH (ref 70–99)
Glucose-Capillary: 203 mg/dL — ABNORMAL HIGH (ref 70–99)

## 2013-09-01 LAB — HIV-1 RNA QUANT-NO REFLEX-BLD: HIV-1 RNA Quant, Log: <1.3 {Log} (ref <1.30)

## 2013-09-01 LAB — CULTURE, ROUTINE-ABSCESS

## 2013-09-01 LAB — BASIC METABOLIC PANEL
BUN: 19 mg/dL (ref 6–23)
CO2: 26 mEq/L (ref 19–32)
CREATININE: 0.92 mg/dL (ref 0.50–1.35)
Calcium: 8.6 mg/dL (ref 8.4–10.5)
Chloride: 101 mEq/L (ref 96–112)
GFR calc Af Amer: >90 mL/min (ref >90)
GFR, EST NON AFRICAN AMERICAN: 80 mL/min — AB (ref >90)
Glucose, Bld: 144 mg/dL — ABNORMAL HIGH (ref 70–99)
Potassium: 4.5 mEq/L (ref 3.7–5.3)
SODIUM: 136 meq/L — AB (ref 137–147)

## 2013-09-01 LAB — CBC
HCT: 21.6 % — ABNORMAL LOW (ref 39.0–52.0)
Hemoglobin: 7.1 g/dL — ABNORMAL LOW (ref 13.0–17.0)
MCH: 26.9 pg (ref 26.0–34.0)
MCHC: 32.9 g/dL (ref 30.0–36.0)
MCV: 81.8 fL (ref 78.0–100.0)
PLATELETS: 308 10*3/uL (ref 150–400)
RBC: 2.64 MIL/uL — ABNORMAL LOW (ref 4.22–5.81)
RDW: 18.9 % — ABNORMAL HIGH (ref 11.5–15.5)
WBC: 10.9 10*3/uL — ABNORMAL HIGH (ref 4.0–10.5)

## 2013-09-01 MED ORDER — DEXTROSE 5 % IV SOLN
2.0000 g | INTRAVENOUS | Status: DC
  Administered 2013-09-01 – 2013-09-04 (×4): 2 g via INTRAVENOUS
  Filled 2013-09-01 (×5): qty 2

## 2013-09-01 MED ORDER — SODIUM CHLORIDE 0.9 % IV SOLN
2.0000 g | Freq: Four times a day (QID) | INTRAVENOUS | Status: DC
  Administered 2013-09-01 – 2013-09-06 (×20): 2 g via INTRAVENOUS
  Filled 2013-09-01 (×21): qty 2000

## 2013-09-01 MED ORDER — ENOXAPARIN SODIUM 40 MG/0.4ML ~~LOC~~ SOLN
40.0000 mg | SUBCUTANEOUS | Status: DC
  Administered 2013-09-01 – 2013-09-06 (×6): 40 mg via SUBCUTANEOUS
  Filled 2013-09-01 (×6): qty 0.4

## 2013-09-01 MED ORDER — GLUCERNA SHAKE PO LIQD
237.0000 mL | Freq: Three times a day (TID) | ORAL | Status: DC
  Administered 2013-09-01 – 2013-09-06 (×13): 237 mL via ORAL
  Filled 2013-09-01 (×2): qty 237

## 2013-09-01 NOTE — Clinical Social Work Note (Signed)
CSW received consult for SNF placement at time of discharge. Pt has been worked up for SNF placement and is to return to [facility] once medically stable for discharge. CSW has been in communication with [facility] throughout pt's hospital stay. CSW to continue to follow and assist with discharge planning needs.  Pati Gallo, [place] Social Worker [phone]

## 2013-09-01 NOTE — Progress Notes (Addendum)
Progress Note    Mike Mack JGO:[id number] DOB: 1936-05-03 DOA: 08/14/2013 PCP: Mike Bolt, MD  Brief narrative: 78 y.o. male who presented on 08/14/2013 with a hx of HTN, HLD, prior stroke, recent laparoscopic cholecystectomy 07/25/2013 by [name] (discharged to [facility] 07/30/2013) with several days duration of persistent epigastric abdominal pain associated with fevers, chills, and malaise.   In the ED CT abdomen was notable for abscess in the gallbladder bed, as well as peri-hepatic abscesses. Surgery was consulted and TRH was asked to admit for further evaluation. Pt started on Vancomycin and Zosyn in ED.   Assessment/Plan: Sinus tachycardia, transient: post procedure. Resolved after saline bolus. D/c tele  Cough: CXR negative  Severe sepsis /  Enterococcus + ?Morganella bacteremia / subhepatic abscess /  epigastric abscess/ liver abscess Continue zosyn. Subhepatic, epigastric abscess drains out, largely resolved. Liver abscess drained yesterday. Still draining. cx pending- gram negative- morganella- sens to zosyn ID following  Diabetes mellitus with neuropathy Improved CBGs except lunch. Continue current for now.  Hypertension meds held  H/o Stroke w/ R sided weakness On ASA  Anemia of chronic disease Monitor- transfuse for < 7  Chronic steroid use - no need for stress dose steroids currently - unclear why pt is on chronic steroids (?arthritits) - suggest very slow wean to off as outpt if able   Thrush had sore throat as well which may be consistent with candida esophagitis - sx resolved. D/c diflucan  BPH - Flomax. Foley out  Deconditioning: SNF once drain removed   Code Status: FULL Family Communication: wife at bedside Disposition Plan: SNF   Consultants: Gen Surg  IR ID  Procedures: 2/27 - abdom abscess drain x2 in IR  PICC 3/13 liver abscess drain by IR  Antibiotics: Ampicillin 3/02 >>3/9 Diflucan 3/01 >>  Zosyn 2/26 >> 3/02;  restarted 3/9>> Vanc 2/26 >> 3/01  DVT prophylaxis: Lovenox  Subjective:  eating well No complaints   Objective: Filed Weights   08/30/13 0639 08/31/13 0608 09/01/13 0609  Weight: 58.469 kg (128 lb 14.4 oz) 57.834 kg (127 lb 8 oz) 57.1 kg (125 lb 14.1 oz)   Blood pressure 120/57, pulse 72, temperature 98.6 F (37 C), temperature source Oral, resp. rate 18, height 5\' 5"  (1.651 m), weight 57.1 kg (125 lb 14.1 oz), SpO2 100.00%.  Intake/Output Summary (Last 24 hours) at 09/01/13 0858 Last data filed at 09/01/13 0609  Gross per 24 hour  Intake    657 ml  Output    885 ml  Net   -228 ml  tele: NSR  Exam: General: more alert. comfortable Lungs: Decreased breath sounds at the bases, no crackles or wheezes Cardiovascular: Regular rate and rhythm without murmur gallop or rub  Abdomen: dressings CDI. Soft, nontender, mild distention. Drain  Extremities: No significant cyanosis, clubbing, or edema bilateral lower extremities  Data Reviewed: Basic Metabolic Panel:  Recent Labs Lab 08/26/13 0537 08/29/13 2025 08/30/13 1215 08/31/13 0542 09/01/13 0445  NA 136* 134*  --  133* 136*  K 4.2 4.2  --  4.5 4.5  CL 98 98  --  97 101  CO2 29 25  --  27 26  GLUCOSE 123* 128* 498* 200* 144*  BUN 11 15  --  25* 19  CREATININE 0.95 1.10  --  1.04 0.92  CALCIUM 8.3* 8.6  --  8.3* 8.6  MG  --  1.7  --   --   --    Liver Function Tests:  Recent Labs  Lab 08/31/13 0542  AST 29  ALT 57*  ALKPHOS 213*  BILITOT 0.2*  PROT 6.5  ALBUMIN 1.7*   CBC:  Recent Labs Lab 08/26/13 0537 08/27/13 0435 08/29/13 2025 08/31/13 0542 09/01/13 0445  WBC 8.6 8.8 22.2* 19.5* 10.9*  NEUTROABS  --   --  21.1* 17.0*  --   HGB 7.1* 7.3* 8.2* 7.0* 7.1*  HCT 22.0* 22.9* 25.2* 22.0* 21.6*  MCV 80.6 80.6 81.6 81.8 81.8  PLT 527* 576* 529* 397 308   Cardiac Enzymes: No results found for this basename: CKTOTAL, CKMB, CKMBINDEX, TROPONINI,  in the last 168 hours CBG:  Recent Labs Lab  08/31/13 0748 08/31/13 1302 08/31/13 1659 08/31/13 2207 09/01/13 0720  GLUCAP 173* 284* 288* 267* 155*    Recent Results (from the past 240 hour(s))  CULTURE, ROUTINE-ABSCESS     Status: None   Collection Time    08/29/13  3:01 PM      Result Value Ref Range Status   Specimen Description ABSCESS LIVER   Final   Special Requests NONE   Final   Gram Stain     Final   Value: ABUNDANT WBC PRESENT,BOTH PMN AND MONONUCLEAR     NO SQUAMOUS EPITHELIAL CELLS SEEN     NO ORGANISMS SEEN     Performed at [facility]   Culture     Final   Value: ABUNDANT MORGANELLA MORGANII     Performed at [facility]   Report Status 09/01/2013 FINAL   Final   Organism ID, Bacteria MORGANELLA MORGANII   Final     Studies:  Recent x-ray studies have been reviewed in detail by the Attending Physician  Scheduled Meds:  Scheduled Meds: . feeding supplement (ENSURE COMPLETE)  237 mL Oral BID PC  . feeding supplement (ENSURE)  1 Container Oral Q24H  . feeding supplement (GLUCERNA SHAKE)  237 mL Oral Q24H  . insulin aspart  0-20 Units Subcutaneous TID WC  . insulin aspart  0-5 Units Subcutaneous QHS  . insulin glargine  10 Units Subcutaneous QHS  . latanoprost  1 drop Both Eyes QHS  . multivitamin with minerals  1 tablet Oral Daily  . piperacillin-tazobactam (ZOSYN)  IV  3.375 g Intravenous 3 times per day  . polyethylene glycol  17 g Oral Daily  . predniSONE  5 mg Oral Q breakfast  . tamsulosin  0.4 mg Oral BID   Time spent on care of this patient: 25 min  Brandin Dilday, [place], DO [place] [facility]  09/01/2013, 8:58 AM   LOS: 18 days

## 2013-09-01 NOTE — Progress Notes (Signed)
    [facility] for Infectious Disease  Date of Admission:  08/14/2013  Antibiotics: Zosyn fluconazole  Subjective: No complaints, drain in place  Objective: Temp:  [97.9 F (36.6 C)-98.6 F (37 C)] 98.5 F (36.9 C) (03/16 1337) Pulse Rate:  [71-88] 88 (03/16 1337) Resp:  [16-18] 18 (03/16 1337) BP: (109-131)/(57-65) 131/65 mmHg (03/16 1337) SpO2:  [98 %-100 %] 98 % (03/16 1337) Weight:  [125 lb 14.1 oz (57.1 kg)] 125 lb 14.1 oz (57.1 kg) (03/16 0609)  General: Awake, alert, nad Skin: no rashes Lungs: CTA B Abdomen: soft,, + drain Ext: no edema  Lab Results Lab Results  Component Value Date   WBC 10.9* 09/01/2013   HGB 7.1* 09/01/2013   HCT 21.6* 09/01/2013   MCV 81.8 09/01/2013   PLT 308 09/01/2013    Lab Results  Component Value Date   CREATININE 0.92 09/01/2013   BUN 19 09/01/2013   NA 136* 09/01/2013   K 4.5 09/01/2013   CL 101 09/01/2013   CO2 26 09/01/2013    Lab Results  Component Value Date   ALT 57* 08/31/2013   AST 29 08/31/2013   ALKPHOS 213* 08/31/2013   BILITOT 0.2* 08/31/2013      Microbiology: Recent Results (from the past 240 hour(s))  CULTURE, ROUTINE-ABSCESS     Status: None   Collection Time    08/29/13  3:01 PM      Result Value Ref Range Status   Specimen Description ABSCESS LIVER   Final   Special Requests NONE   Final   Gram Stain     Final   Value: ABUNDANT WBC PRESENT,BOTH PMN AND MONONUCLEAR     NO SQUAMOUS EPITHELIAL CELLS SEEN     NO ORGANISMS SEEN     Performed at [facility]   Culture     Final   Value: ABUNDANT MORGANELLA MORGANII     Performed at [facility]   Report Status 09/01/2013 FINAL   Final   Organism ID, Bacteria MORGANELLA MORGANII   Final    Studies/Results: No results found.  Assessment/Plan: 1) abdominal and liver abscess - + Enterococcus, ampicillin sensitive and Morganelli ceftriaxone senstive.  No unifying antibiotics.  I will change him to the ampicillin for the Enterococcus and  Ceftriaxone for the [facility].  His duration will be until abscess resolved.    I will follow up later this week. thanks  Scharlene Gloss, [place] [facility] [place] [email or web address] [phone] pager   [phone] cell 09/01/2013, 2:28 PM

## 2013-09-01 NOTE — Progress Notes (Signed)
Subjective: Patient states feeling better each day, admits to pain around drain site, denies any fever or chills.   Objective: Physical Exam: BP 120/57  Pulse 72  Temp(Src) 98.6 F (37 C) (Oral)  Resp 18  Ht 5\' 5"  (1.651 m)  Wt 125 lb 14.1 oz (57.1 kg)  BMI 20.95 kg/m2  SpO2 100%  General: A&Ox3, NAD Abd: Soft, ND, tenderness at RUQ drain site RUQ drain site: Dressing C/D/I, output blood 24 hr 35cc, Cx  MORGANELLA MORGANII    Labs: CBC  Recent Labs  08/31/13 0542 09/01/13 0445  WBC 19.5* 10.9*  HGB 7.0* 7.1*  HCT 22.0* 21.6*  PLT 397 308   BMET  Recent Labs  08/31/13 0542 09/01/13 0445  NA 133* 136*  K 4.5 4.5  CL 97 101  CO2 27 26  GLUCOSE 200* 144*  BUN 25* 19  CREATININE 1.04 0.92  CALCIUM 8.3* 8.6   LFT  Recent Labs  08/31/13 0542  PROT 6.5  ALBUMIN 1.7*  AST 29  ALT 57*  ALKPHOS 213*  BILITOT 0.2*   PT/INR No results found for this basename: LABPROT, INR,  in the last 72 hours   Studies/Results: No results found.  Assessment/Plan: Post lap chole 2/6 RUQ abscess s/p drain 2F in IR 3/13 Output trending down, consider repeat imaging in next couple days if continued decreased output. WBC trending down, afebrile.    LOS: 18 days    Rockney Ghee 09/01/2013 11:50 AM

## 2013-09-01 NOTE — Progress Notes (Addendum)
Subjective: Alert and stable. Has some pain at the drain site, right upper quadrant, otherwise denies abdominal pain. Tolerating diet.  Cultures growing gram-negative rods. WBC 10,900.On Zosyn.  Objective: Vital signs in last 24 hours: Temp:  [97.9 F (36.6 C)-98.9 F (37.2 C)] 97.9 F (36.6 C) (03/15 2306) Pulse Rate:  [71-87] 71 (03/15 2306) Resp:  [16-17] 16 (03/15 2306) BP: (109-117)/(54-60) 109/60 mmHg (03/15 2306) SpO2:  [97 %-100 %] 100 % (03/15 2306) Weight:  [127 lb 8 oz (57.834 kg)] 127 lb 8 oz (57.834 kg) (03/15 0814) Last BM Date: 08/30/13  Intake/Output from previous day: 03/15 0701 - 03/16 0700 In: 647 [P.O.:597; IV Piggyback:50] Out: 620 [Urine:600; Drains:20] Intake/Output this shift: Total I/O In: -  Out: 150 [Urine:150]    EXAM: General appearance: alert and cooperative, but clearly deconditioned. Affect depressed. As. A distress. Nontoxic. GI: right upper quadrant drain site a little bit tender. The rest of his abdomen is completely soft and nontender. Drainage is serosanguineous.  Lab Results:   Recent Labs  08/31/13 0542 09/01/13 0445  WBC 19.5* 10.9*  HGB 7.0* 7.1*  HCT 22.0* 21.6*  PLT 397 308   BMET  Recent Labs  08/29/13 2025 08/30/13 1215 08/31/13 0542  NA 134*  --  133*  K 4.2  --  4.5  CL 98  --  97  CO2 25  --  27  GLUCOSE 128* 498* 200*  BUN 15  --  25*  CREATININE 1.10  --  1.04  CALCIUM 8.6  --  8.3*   PT/INR  Recent Labs  08/29/13 1100  LABPROT 14.9  INR 1.20   ABG No results found for this basename: PHART, PCO2, PO2, HCO3,  in the last 72 hours  Studies/Results: No results found.  Anti-infectives: Anti-infectives   Start     Dose/Rate Route Frequency Ordered Stop   08/25/13 1400  piperacillin-tazobactam (ZOSYN) IVPB 3.375 g     3.375 g 12.5 mL/hr over 240 Minutes Intravenous 3 times per day 08/25/13 1359     08/18/13 1645  fluconazole (DIFLUCAN) tablet 100 mg  Status:  Discontinued     100 mg Oral  Daily 08/18/13 1641 08/30/13 1334   08/18/13 1200  ampicillin (OMNIPEN) 2 g in sodium chloride 0.9 % 50 mL IVPB  Status:  Discontinued    Comments:  Pharmacy may adjust as needed   2 g 150 mL/hr over 20 Minutes Intravenous 4 times per day 08/18/13 1049 08/25/13 1300   08/17/13 1000  fluconazole (DIFLUCAN) IVPB 100 mg  Status:  Discontinued     100 mg 50 mL/hr over 60 Minutes Intravenous Every 24 hours 08/17/13 0936 08/18/13 1641   08/16/13 1100  vancomycin (VANCOCIN) 500 mg in sodium chloride 0.9 % 100 mL IVPB  Status:  Discontinued     500 mg 100 mL/hr over 60 Minutes Intravenous Every 12 hours 08/16/13 1016 08/18/13 1049   08/15/13 1200  vancomycin (VANCOCIN) 1,250 mg in sodium chloride 0.9 % 250 mL IVPB  Status:  Discontinued     1,250 mg 166.7 mL/hr over 90 Minutes Intravenous Every 24 hours 08/14/13 1911 08/16/13 1016   08/15/13 0000  piperacillin-tazobactam (ZOSYN) IVPB 3.375 g  Status:  Discontinued     3.375 g 12.5 mL/hr over 240 Minutes Intravenous Every 8 hours 08/14/13 1911 08/18/13 1049   08/14/13 1530  vancomycin (VANCOCIN) IVPB 1000 mg/200 mL premix     1,000 mg 200 mL/hr over 60 Minutes Intravenous  Once 08/14/13 1526 08/14/13  1823   08/14/13 1530  piperacillin-tazobactam (ZOSYN) IVPB 3.375 g     3.375 g 100 mL/hr over 30 Minutes Intravenous  Once 08/14/13 1526 08/14/13 1603      Assessment/Plan:   1. Laparoscopic cholecystectomy and extensive LOA - 07/25/2013 - [facility]. [facility]   2. Gall bladder fossa abscess  Drainage 08/15/2013 - drains out and extrahepatic fluid collections resolved.  Enterococcus in blood  On diflucan/zosyn- ID following   Intraparenchymal liver abscess. Drainage 08/29/2013 Gram-negative rods. Zosyn. Repeat CT scan when drainage subsides Recommend that his drains be removed prior to transfer back to SNF.  3. Thrush  On mycostatin/diflucan. swallowing better.   4. History of polio  5. IDDM  6. Arthritis - on chronic prednisone  7. DVT  prophylaxis - Lovenox  8. Right pleural effusion  9. Anemia  Hgb - 7.1 - 09/01/2013 - stable    LOS: 18 days    Emy Angevine M 09/01/2013

## 2013-09-01 NOTE — Progress Notes (Signed)
Physical Therapy Treatment Patient Details Name: Mike Mack MRN: [id number] DOB: 1935-08-31 Today's Date: 09/01/2013 Time: 3810-1751 PT Time Calculation (min): 24 min  PT Assessment / Plan / Recommendation  History of Present Illness pt has had lap chole surgery 07/25/13. Pt returned from SNF with n/v/fever and abd pain. Pt found on ct to have biloma arising from surgical bed; posterior Rt hepatic lobe abscess; L hepatic lobe abscess extending beneath abd wall.    PT Comments   Pt progressing towards physical therapy goals. Decreased tolerance of functional activity limiting factor during session. Pt did well with static standing but was unable to advance feet to take steps, or even pivot from bed to chair without total assist. Will continue to follow.   Follow Up Recommendations  SNF     Does the patient have the potential to tolerate intense rehabilitation     Barriers to Discharge        Equipment Recommendations  Other (comment) (TBD by next venue of care)    Recommendations for Other Services    Frequency Min 2X/week   Progress towards PT Goals Progress towards PT goals: Progressing toward goals  Plan Current plan remains appropriate    Precautions / Restrictions Precautions Precautions: Fall Restrictions Weight Bearing Restrictions: No   Pertinent Vitals/Pain Pt reports minimal pain, just fatigue, during session.     Mobility  Bed Mobility Overal bed mobility: Needs Assistance Bed Mobility: Sidelying to Sit Sidelying to sit: Mod assist;+2 for physical assistance General bed mobility comments: VC's for bed rail use. Pt able to scoot hips somewhat close to EOB, however significant assist required for movement of RLE and trunk elevation to full sitting on EOB.  Transfers Overall transfer level: Needs assistance Equipment used: Rolling walker (2 wheeled) Transfers: Sit to/from [facility] Sit to Stand: Mod assist;+2 physical assistance Stand pivot  transfers: Total assist;+2 physical assistance General transfer comment: Pt able to stand with RW, however unable to pivot. Pt fatigued quickly and bed was pushed out of the way so chair could be pulled up prior to pt sitting.     Exercises General Exercises - Lower Extremity Ankle Circles/Pumps: 15 reps (mainly toe wiggles on R side due to polio) Quad Sets: 10 reps;Left (Unable on right)   PT Diagnosis:    PT Problem List:   PT Treatment Interventions:     PT Goals (current goals can now be found in the care plan section) Acute Rehab PT Goals Patient Stated Goal: go to rehab prior to d/c home. PT Goal Formulation: With patient Time For Goal Achievement: 09/03/13 Potential to Achieve Goals: Fair  Visit Information  Last PT Received On: 09/01/13 Assistance Needed: +2 History of Present Illness: pt has had lap chole surgery 07/25/13. Pt returned from SNF with n/v/fever and abd pain. Pt found on ct to have biloma arising from surgical bed; posterior Rt hepatic lobe abscess; L hepatic lobe abscess extending beneath abd wall.     Subjective Data  Subjective: Pt agreeable to OOB with therapy.  Patient Stated Goal: go to rehab prior to d/c home.   Cognition  Cognition Arousal/Alertness: Awake/alert Behavior During Therapy: WFL for tasks assessed/performed Overall Cognitive Status: No family/caregiver present to determine baseline cognitive functioning    Balance  Balance Overall balance assessment: Needs assistance Sitting-balance support: Feet supported;Bilateral upper extremity supported Sitting balance-Leahy Scale: Poor Sitting balance - Comments: pt unable to maintain upright position, pt with significant R lateral and posterior lean.Patient able to pull self into  sitting while in recliner with use of armrest.  Postural control: Right lateral lean;Posterior lean Standing balance support: Bilateral upper extremity supported Standing balance-Leahy Scale: Poor (progressing to zero)   End of Session PT - End of Session Equipment Utilized During Treatment: Gait belt Activity Tolerance: Patient limited by fatigue Patient left: in chair;with call bell/phone within reach Nurse Communication: Mobility status   GP     Jolyn Lent 09/01/2013, 12:19 PM  Jolyn Lent, PT, DPT Acute Rehabilitation Services Pager: [phone]

## 2013-09-01 NOTE — Progress Notes (Signed)
Occupational Therapy Treatment Patient Details Name: Mike Mack MRN: [id number] DOB: [birth year] Today's Date: 09/01/2013 Time: 3086-5784 OT Time Calculation (min): 24 min  OT Assessment / Plan / Recommendation  History of present illness pt has had lap chole surgery 07/25/13. Pt returned from SNF with n/v/fever and abd pain. Pt found on ct to have biloma arising from surgical bed; posterior Rt hepatic lobe abscess; L hepatic lobe abscess extending beneath abd wall.    OT comments  Pt sitting up in recliner.  Moved to [place], worked on reaching with bil. UEs to improve balance and trunk control, and worked on sit to partial stand x 6 trials in prep for LB ADLs - required max A  Follow Up Recommendations  SNF;Supervision/Assistance - 24 hour    Barriers to Discharge       Equipment Recommendations  None recommended by OT    Recommendations for Other Services    Frequency Min 2X/week   Progress towards OT Goals Progress towards OT goals: Progressing toward goals  Plan Discharge plan remains appropriate    Precautions / Restrictions Precautions Precautions: Fall Precaution Comments: JP drain Rt.  Restrictions Weight Bearing Restrictions: No   Pertinent Vitals/Pain     ADL  Grooming: Wash/dry face;Wash/dry hands;Supervision/safety Where Assessed - Grooming: Unsupported sitting Toilet Transfer: Maximal assistance (+2) Toilet Transfer Method: Sit to stand ADL Comments: Pt sitting in recliner.  Moved to [place] with supervision and min verbal cues.  Worked on reaching while sitting EOC, then worked on sit to partial stand x 6 with max A to achieve partial stand in prep for LB ADLs and functional transfers.  Pt requires rest breaks between each attempt.    OT Diagnosis:    OT Problem List:   OT Treatment Interventions:     OT Goals(current goals can now be found in the care plan section) Acute Rehab OT Goals Patient Stated Goal: go to rehab prior to d/c home. Time For Goal  Achievement: 09/04/13 ADL Goals Pt Will Perform Grooming: sitting;with set-up;with min assist Pt Will Perform Upper Body Bathing: with set-up;with min assist;sitting Pt Will Perform Upper Body Dressing: with set-up;with min assist;sitting Additional ADL Goal #1: Pt will be Min A bed mobility in preparation for increased participation in ADL's Additional ADL Goal #2: Pt will tolerate 5 min sitting EOB w/ Min A in preparation for increased ADL participation  Visit Information  Last OT Received On: 09/01/13 Assistance Needed: +2 History of Present Illness: pt has had lap chole surgery 07/25/13. Pt returned from SNF with n/v/fever and abd pain. Pt found on ct to have biloma arising from surgical bed; posterior Rt hepatic lobe abscess; L hepatic lobe abscess extending beneath abd wall.     Subjective Data      Prior Functioning       Cognition  Cognition Arousal/Alertness: Awake/alert Behavior During Therapy: WFL for tasks assessed/performed Overall Cognitive Status: No family/caregiver present to determine baseline cognitive functioning    Mobility  Bed Mobility Overal bed mobility: Needs Assistance Bed Mobility: Sidelying to Sit Sidelying to sit: Mod assist;+2 for physical assistance General bed mobility comments: VC's for bed rail use. Pt able to scoot hips somewhat close to EOB, however significant assist required for movement of RLE and trunk elevation to full sitting on EOB.  Transfers Overall transfer level: Needs assistance Equipment used: Rolling walker (2 wheeled) Transfers: Sit to/from Stand Sit to Stand: Max assist (partial stand from chair) Stand pivot transfers: Total assist;+2 physical assistance General transfer comment:  Pt only able to achieve partial standing with max A +1 from recliner    Exercises  General Exercises - Lower Extremity Ankle Circles/Pumps: 15 reps (mainly toe wiggles on R side due to polio) Quad Sets: 10 reps;Left (Unable on right)   Balance  Balance Overall balance assessment: Needs assistance Sitting-balance support: Single extremity supported;Feet supported Sitting balance-Leahy Scale: Good Sitting balance - Comments: reaching forward and to side 3-6' of BOS while sitting EOC Postural control: Right lateral lean;Posterior lean Standing balance support: Bilateral upper extremity supported Standing balance-Leahy Scale: Poor (progressing to zero)  End of Session OT - End of Session Activity Tolerance: Patient limited by fatigue Patient left: in chair;with call bell/phone within reach Nurse Communication: Mobility status  [place], Myrtis Maille M 09/01/2013, 12:34 PM

## 2013-09-01 NOTE — Progress Notes (Signed)
NUTRITION FOLLOW-UP  INTERVENTION: Provide Glucerna Shake po TID, each supplement provides 220 kcal and 10 grams of protein Provide Snack once daily Encourage PO intake Recommend considering an appetite stimulant  Continue MVI daily. RD to continue to follow nutrition care plan.  NUTRITION DIAGNOSIS: Increased nutrient needs related to wound healing as evidenced by estimated nutrition needs. Ongoing  Goal: Pt to meet >/= 90% of their estimated nutrition needs ; unmet  Monitor:  PO intake and supplement tolerance, weight, labs, I/O's  ASSESSMENT: 78 yo male with history of diabetes, HTN, HLD, prior stroke, recent laparoscopic cholecystectomy 07/25/2013 by [name], and discharged to [facility] 07/30/2013, now presented to [facility] ED with main concern of several days duration of persistent epigastric abdominal pain, throbbing and 5/10 in severity, non radiating, associated with fevers, chills, malaise, poor oral intake, no specific aggravating or alleviating factors.   Pt with severe sepsis s/p cholecystectomy; now s/p 2 perc drains.  3/10: Advanced to Dysphagia 1 diet (purees) 3/3 and dysphagia 2 diet 3/4. Per nursing notes pt has been eating 15-50% of most meals. Per order history pt has been accepting most Ensure supplements BID. Pt's lunch tray was in room, mostly untouched. Pt drinking Ensure Complete at time of visit; states he has no appetite. He reports drinking 1 or 2 Ensure shakes daily. Encouraged pt to eat more and discussed additional nutritional supplement options.   3/16: Pt's diet was advanced to Carb Modified 3/14. Per nursing notes pt continues to eat 15-50% of meals. Pt has been drinking Ensure Complete BID and Glucerna Shake at night but, blood glucose has been high. Pt agreeable to changing all supplements to Glucerna. Pt reports eating well today and RN confirms pt report.  Pt's weight continues to trend down with additional 1 lb wt loss this past week.  Labs  reviewed  Height: Ht Readings from Last 1 Encounters:  08/16/13 5\' 5"  (1.651 m)    Weight: Wt Readings from Last 1 Encounters:  09/01/13 125 lb 14.1 oz (57.1 kg)  Admit wt 131 lb 08/24/13 126 lb  BMI:  Body mass index is 20.95 kg/(m^2). WNL  Estimated Nutritional Needs: Kcal: 1650-1850 Protein: 75-85 gm Fluid: 1.6-1.8 L  Skin: deep tissue injury to R heel  Diet Order: Carb Control     Intake/Output Summary (Last 24 hours) at 09/01/13 1510 Last data filed at 09/01/13 1350  Gross per 24 hour  Intake    367 ml  Output    600 ml  Net   -233 ml    Last BM: 3/14  Labs:   Recent Labs Lab 08/29/13 2025 08/30/13 1215 08/31/13 0542 09/01/13 0445  NA 134*  --  133* 136*  K 4.2  --  4.5 4.5  CL 98  --  97 101  CO2 25  --  27 26  BUN 15  --  25* 19  CREATININE 1.10  --  1.04 0.92  CALCIUM 8.6  --  8.3* 8.6  MG 1.7  --   --   --   GLUCOSE 128* 498* 200* 144*    CBG (last 3)   Recent Labs  08/31/13 2207 09/01/13 0720 09/01/13 1150  GLUCAP 267* 155* 299*    Scheduled Meds: . ampicillin (OMNIPEN) IV  2 g Intravenous 4 times per day  . cefTRIAXone (ROCEPHIN)  IV  2 g Intravenous Q24H  . enoxaparin (LOVENOX) injection  40 mg Subcutaneous Q24H  . feeding supplement (GLUCERNA SHAKE)  237 mL Oral TID BM  .  insulin aspart  0-20 Units Subcutaneous TID WC  . insulin aspart  0-5 Units Subcutaneous QHS  . insulin glargine  10 Units Subcutaneous QHS  . latanoprost  1 drop Both Eyes QHS  . multivitamin with minerals  1 tablet Oral Daily  . polyethylene glycol  17 g Oral Daily  . predniSONE  5 mg Oral Q breakfast  . tamsulosin  0.4 mg Oral BID    Continuous Infusions:    Pryor Ochoa RD, LDN Inpatient Clinical Dietitian Pager: [phone] After Hours Pager: [phone]

## 2013-09-02 LAB — GLUCOSE, CAPILLARY
GLUCOSE-CAPILLARY: 142 mg/dL — AB (ref 70–99)
GLUCOSE-CAPILLARY: 243 mg/dL — AB (ref 70–99)
Glucose-Capillary: 261 mg/dL — ABNORMAL HIGH (ref 70–99)
Glucose-Capillary: 211 mg/dL — ABNORMAL HIGH (ref 70–99)

## 2013-09-02 NOTE — Progress Notes (Signed)
Subjective: Hepatic abscess drain placed 3/13 Better daily   Objective: Vital signs in last 24 hours: Temp:  [98.1 F (36.7 C)-98.7 F (37.1 C)] 98.7 F (37.1 C) (03/17 0551) Pulse Rate:  [84-93] 84 (03/17 0551) Resp:  [17-18] 17 (03/17 0551) BP: (131-136)/(65-78) 136/78 mmHg (03/17 0551) SpO2:  [98 %-100 %] 100 % (03/17 0551) Weight:  [57.2 kg (126 lb 1.7 oz)] 57.2 kg (126 lb 1.7 oz) (03/17 0551) Last BM Date: 08/31/13  Intake/Output from previous day: 03/16 0701 - 03/17 0700 In: 400 [P.O.:240; IV Piggyback:150] Out: 1277 [Urine:1250; Drains:27] Intake/Output this shift:    PE:  Afeb; vss Wbc down Drain site NT; no bleeding Output 27 cc yesterday: bloody serous fluid Cx Morganella Morganii  Lab Results:   Recent Labs  08/31/13 0542 09/01/13 0445  WBC 19.5* 10.9*  HGB 7.0* 7.1*  HCT 22.0* 21.6*  PLT 397 308   BMET  Recent Labs  08/31/13 0542 09/01/13 0445  NA 133* 136*  K 4.5 4.5  CL 97 101  CO2 27 26  GLUCOSE 200* 144*  BUN 25* 19  CREATININE 1.04 0.92  CALCIUM 8.3* 8.6   PT/INR No results found for this basename: LABPROT, INR,  in the last 72 hours ABG No results found for this basename: PHART, PCO2, PO2, HCO3,  in the last 72 hours  Studies/Results: No results found.  Anti-infectives: Anti-infectives   Start     Dose/Rate Route Frequency Ordered Stop   09/01/13 1500  cefTRIAXone (ROCEPHIN) 2 g in dextrose 5 % 50 mL IVPB     2 g 100 mL/hr over 30 Minutes Intravenous Every 24 hours 09/01/13 1432     09/01/13 1500  ampicillin (OMNIPEN) 2 g in sodium chloride 0.9 % 50 mL IVPB    Comments:  Pharmacy may adjust   2 g 150 mL/hr over 20 Minutes Intravenous 4 times per day 09/01/13 1432     08/25/13 1400  piperacillin-tazobactam (ZOSYN) IVPB 3.375 g  Status:  Discontinued     3.375 g 12.5 mL/hr over 240 Minutes Intravenous 3 times per day 08/25/13 1359 09/01/13 1432   08/18/13 1645  fluconazole (DIFLUCAN) tablet 100 mg  Status:  Discontinued      100 mg Oral Daily 08/18/13 1641 08/30/13 1334   08/18/13 1200  ampicillin (OMNIPEN) 2 g in sodium chloride 0.9 % 50 mL IVPB  Status:  Discontinued    Comments:  Pharmacy may adjust as needed   2 g 150 mL/hr over 20 Minutes Intravenous 4 times per day 08/18/13 1049 08/25/13 1300   08/17/13 1000  fluconazole (DIFLUCAN) IVPB 100 mg  Status:  Discontinued     100 mg 50 mL/hr over 60 Minutes Intravenous Every 24 hours 08/17/13 0936 08/18/13 1641   08/16/13 1100  vancomycin (VANCOCIN) 500 mg in sodium chloride 0.9 % 100 mL IVPB  Status:  Discontinued     500 mg 100 mL/hr over 60 Minutes Intravenous Every 12 hours 08/16/13 1016 08/18/13 1049   08/15/13 1200  vancomycin (VANCOCIN) 1,250 mg in sodium chloride 0.9 % 250 mL IVPB  Status:  Discontinued     1,250 mg 166.7 mL/hr over 90 Minutes Intravenous Every 24 hours 08/14/13 1911 08/16/13 1016   08/15/13 0000  piperacillin-tazobactam (ZOSYN) IVPB 3.375 g  Status:  Discontinued     3.375 g 12.5 mL/hr over 240 Minutes Intravenous Every 8 hours 08/14/13 1911 08/18/13 1049   08/14/13 1530  vancomycin (VANCOCIN) IVPB 1000 mg/200 mL premix  1,000 mg 200 mL/hr over 60 Minutes Intravenous  Once 08/14/13 1526 08/14/13 1823   08/14/13 1530  piperacillin-tazobactam (ZOSYN) IVPB 3.375 g     3.375 g 100 mL/hr over 30 Minutes Intravenous  Once 08/14/13 1526 08/14/13 1603      Assessment/Plan: s/p * No surgery found *  Hepatic abscess\drain placed 3/13 Output diminishing Wbc down Need Re CT when output 10-15/cc in 24 hrs   LOS: 19 days    Kathan Kirker A 09/02/2013

## 2013-09-02 NOTE — Progress Notes (Signed)
Inpatient Diabetes Program Recommendations  AACE/ADA: New Consensus Statement on Inpatient Glycemic Control (2013)  Target Ranges:  Prepandial:   less than 140 mg/dL      Peak postprandial:   less than 180 mg/dL (1-2 hours)      Critically ill patients:  140 - 180 mg/dL   Results for KELLEN, DUTCH (MRN [id number]) as of 09/02/2013 13:52  Ref. Range 09/01/2013 07:20 09/01/2013 11:50 09/01/2013 16:29 09/01/2013 22:06 09/02/2013 07:32 09/02/2013 12:14  Glucose-Capillary Latest Range: 70-99 mg/dL 155 (H) 299 (H) 206 (H) 203 (H) 142 (H) 261 (H)   Note:  CBG pattern indicates need for meal coverage-- even though patient eating about 50%.  Request MD order for Novolog 3 units tid with meals provided patient eats at least 50% and CBG at least 80 mg/dl-- to be given in addition to correction scale.  Thank you.  Zianna Dercole S. Marcelline Mates, RN, CNS, CDE Inpatient Diabetes Program, team pager [phone]

## 2013-09-02 NOTE — Progress Notes (Signed)
Progress Note    Mike Mack BPZ:[id number] DOB: [birth year] DOA: 08/14/2013 PCP: Mike Bolt, MD  Brief narrative: 78 y.o. male who presented on 08/14/2013 with a hx of HTN, HLD, prior stroke, recent laparoscopic cholecystectomy 07/25/2013 by Dr. Dalbert Mack (discharged to [facility] 07/30/2013) with several days duration of persistent epigastric abdominal pain associated with fevers, chills, and malaise.   In the ED CT abdomen was notable for abscess in the gallbladder bed, as well as peri-hepatic abscesses. Surgery was consulted and [facility] was asked to admit for further evaluation. Pt started on Vancomycin and Zosyn in ED.   Assessment/Plan: Sinus tachycardia, transient: post procedure. Resolved after saline bolus. D/c tele  Cough: CXR negative  Severe sepsis /  Enterococcus + ?Morganella bacteremia / subhepatic abscess /  epigastric abscess/ liver abscess Continue zosyn. Subhepatic, epigastric abscess drains out, largely resolved. Liver abscess drained yesterday. Still draining some. cx pending- gram negative- morganella- sens to zosyn ID following -before pulling drain will need to re-scan  Leukocytosis -resolving   Diabetes mellitus with neuropathy Improved CBGs except lunch. Continue current for now.  Hypertension meds held  H/o Stroke w/ R sided weakness On ASA  Anemia of chronic disease Monitor- transfuse for < 7  Chronic steroid use - no need for stress dose steroids currently - unclear why pt is on chronic steroids (?arthritits) - suggest very slow wean to off as outpt if able   Thrush had sore throat as well which may be consistent with candida esophagitis - sx resolved. D/c diflucan  BPH - Flomax. Foley out  Deconditioning: SNF once drain removed   Code Status: FULL Family Communication: wife at bedside Disposition Plan: SNF   Consultants: Gen Surg  IR ID  Procedures: 2/27 - abdom abscess drain x2 in IR  PICC 3/13 liver abscess drain by  IR  Antibiotics: Ampicillin 3/02 >>3/9 Diflucan 3/01 >>  Zosyn 2/26 >> 3/02; restarted 3/9>> Vanc 2/26 >> 3/01  DVT prophylaxis: Lovenox  Subjective: No problems urinating   Objective: Filed Weights   08/31/13 0608 09/01/13 0609 09/02/13 0551  Weight: 57.834 kg (127 lb 8 oz) 57.1 kg (125 lb 14.1 oz) 57.2 kg (126 lb 1.7 oz)   Blood pressure 136/78, pulse 84, temperature 98.7 F (37.1 C), temperature source Oral, resp. rate 17, height 5\' 5"  (1.651 m), weight 57.2 kg (126 lb 1.7 oz), SpO2 100.00%.  Intake/Output Summary (Last 24 hours) at 09/02/13 0842 Last data filed at 09/02/13 2423  Gross per 24 hour  Intake    405 ml  Output   1277 ml  Net   -872 ml    Exam: General: more alert. comfortable Lungs: Decreased breath sounds at the bases, no crackles or wheezes Cardiovascular: Regular rate and rhythm without murmur gallop or rub  Abdomen: dressings CDI. Soft, nontender, mild distention. Drain  Extremities: No significant cyanosis, clubbing, or edema bilateral lower extremities  Data Reviewed: Basic Metabolic Panel:  Recent Labs Lab 08/29/13 2025 08/30/13 1215 08/31/13 0542 09/01/13 0445  NA 134*  --  133* 136*  K 4.2  --  4.5 4.5  CL 98  --  97 101  CO2 25  --  27 26  GLUCOSE 128* 498* 200* 144*  BUN 15  --  25* 19  CREATININE 1.10  --  1.04 0.92  CALCIUM 8.6  --  8.3* 8.6  MG 1.7  --   --   --    Liver Function Tests:  Recent Labs Lab 08/31/13 0542  AST  29  ALT 57*  ALKPHOS 213*  BILITOT 0.2*  PROT 6.5  ALBUMIN 1.7*   CBC:  Recent Labs Lab 08/27/13 0435 08/29/13 2025 08/31/13 0542 09/01/13 0445  WBC 8.8 22.2* 19.5* 10.9*  NEUTROABS  --  21.1* 17.0*  --   HGB 7.3* 8.2* 7.0* 7.1*  HCT 22.9* 25.2* 22.0* 21.6*  MCV 80.6 81.6 81.8 81.8  PLT 576* 529* 397 308   Cardiac Enzymes: No results found for this basename: CKTOTAL, CKMB, CKMBINDEX, TROPONINI,  in the last 168 hours CBG:  Recent Labs Lab 09/01/13 0720 09/01/13 1150  09/01/13 1629 09/01/13 2206 09/02/13 0732  GLUCAP 155* 299* 206* 203* 142*    Recent Results (from the past 240 hour(s))  CULTURE, ROUTINE-ABSCESS     Status: None   Collection Time    08/29/13  3:01 PM      Result Value Ref Range Status   Specimen Description ABSCESS LIVER   Final   Special Requests NONE   Final   Gram Stain     Final   Value: ABUNDANT WBC PRESENT,BOTH PMN AND MONONUCLEAR     NO SQUAMOUS EPITHELIAL CELLS SEEN     NO ORGANISMS SEEN     Performed at [facility]   Culture     Final   Value: ABUNDANT MORGANELLA MORGANII     Performed at [facility]   Report Status 09/01/2013 FINAL   Final   Organism ID, Bacteria MORGANELLA MORGANII   Final     Studies:  Recent x-ray studies have been reviewed in detail by the Attending Physician  Scheduled Meds:  Scheduled Meds: . ampicillin (OMNIPEN) IV  2 g Intravenous 4 times per day  . cefTRIAXone (ROCEPHIN)  IV  2 g Intravenous Q24H  . enoxaparin (LOVENOX) injection  40 mg Subcutaneous Q24H  . feeding supplement (GLUCERNA SHAKE)  237 mL Oral TID BM  . insulin aspart  0-20 Units Subcutaneous TID WC  . insulin aspart  0-5 Units Subcutaneous QHS  . insulin glargine  10 Units Subcutaneous QHS  . latanoprost  1 drop Both Eyes QHS  . multivitamin with minerals  1 tablet Oral Daily  . polyethylene glycol  17 g Oral Daily  . predniSONE  5 mg Oral Q breakfast  . tamsulosin  0.4 mg Oral BID   Time spent on care of this patient: 25 min  Mike Mack, [place], DO [facility]  09/02/2013, 8:42 AM   LOS: 19 days

## 2013-09-03 LAB — GLUCOSE, CAPILLARY
GLUCOSE-CAPILLARY: 184 mg/dL — AB (ref 70–99)
Glucose-Capillary: 184 mg/dL — ABNORMAL HIGH (ref 70–99)
Glucose-Capillary: 203 mg/dL — ABNORMAL HIGH (ref 70–99)
Glucose-Capillary: 174 mg/dL — ABNORMAL HIGH (ref 70–99)

## 2013-09-03 LAB — BASIC METABOLIC PANEL
BUN: 15 mg/dL (ref 6–23)
CALCIUM: 9.1 mg/dL (ref 8.4–10.5)
CO2: 25 mEq/L (ref 19–32)
CREATININE: 0.81 mg/dL (ref 0.50–1.35)
Chloride: 99 mEq/L (ref 96–112)
GFR, EST NON AFRICAN AMERICAN: 85 mL/min — AB (ref >90)
GLUCOSE: 160 mg/dL — AB (ref 70–99)
POTASSIUM: 4.3 meq/L (ref 3.7–5.3)
Sodium: 136 mEq/L — ABNORMAL LOW (ref 137–147)

## 2013-09-03 LAB — CBC
HCT: 25.8 % — ABNORMAL LOW (ref 39.0–52.0)
Hemoglobin: 8.3 g/dL — ABNORMAL LOW (ref 13.0–17.0)
MCH: 26.3 pg (ref 26.0–34.0)
MCHC: 32.2 g/dL (ref 30.0–36.0)
MCV: 81.9 fL (ref 78.0–100.0)
Platelets: 361 10*3/uL (ref 150–400)
RBC: 3.15 MIL/uL — ABNORMAL LOW (ref 4.22–5.81)
RDW: 18.6 % — AB (ref 11.5–15.5)
WBC: 7.4 10*3/uL (ref 4.0–10.5)

## 2013-09-03 MED ORDER — INSULIN ASPART 100 UNIT/ML ~~LOC~~ SOLN
3.0000 [IU] | Freq: Three times a day (TID) | SUBCUTANEOUS | Status: DC
  Administered 2013-09-03 – 2013-09-06 (×9): 3 [IU] via SUBCUTANEOUS

## 2013-09-03 NOTE — Progress Notes (Signed)
Subjective: Pt feels ok. Just finished breakfast. Appetite some better Denies much pain  Objective: Physical Exam: BP 133/68  Pulse 81  Temp(Src) 98 F (36.7 C) (Oral)  Resp 17  Ht 5\' 5"  (1.651 m)  Wt 126 lb 1.7 oz (57.2 kg)  BMI 20.98 kg/m2  SpO2 100% RUQ drain intact, site clean, NT Small amount output in bulb, serosanguinous Drain flushed with 5cc NS, return of serosanguinous, bulb re-engaged.    Labs: CBC  Recent Labs  09/01/13 0445 09/03/13 0512  WBC 10.9* 7.4  HGB 7.1* 8.3*  HCT 21.6* 25.8*  PLT 308 361   BMET  Recent Labs  09/01/13 0445 09/03/13 0512  NA 136* 136*  K 4.5 4.3  CL 101 99  CO2 26 25  GLUCOSE 144* 160*  BUN 19 15  CREATININE 0.92 0.81  CALCIUM 8.6 9.1   LFT No results found for this basename: PROT, ALBUMIN, AST, ALT, ALKPHOS, BILITOT, BILIDIR, IBILI, LIPASE,  in the last 72 hours PT/INR No results found for this basename: LABPROT, INR,  in the last 72 hours   Studies/Results: No results found.  Assessment/Plan: Intrahepatic abscess s/p perc drain Output trending down, if less than 20cc/24hr, recommend repeat to re-eval prior to drain removal.    LOS: 20 days    Ascencion Dike PA-C 09/03/2013 9:27 AM

## 2013-09-03 NOTE — Progress Notes (Signed)
Progress Note    Mike Mack LKG:[id number] DOB: [birth year] DOA: 08/14/2013 PCP: [name], MD  Brief narrative: 78 y.o. male who presented on 08/14/2013 with a hx of HTN, HLD, prior stroke, recent laparoscopic cholecystectomy 07/25/2013 by [name] (discharged to [facility] 07/30/2013) with several days duration of persistent epigastric abdominal pain associated with fevers, chills, and malaise.   In the ED CT abdomen was notable for abscess in the gallbladder bed, as well as peri-hepatic abscesses. Surgery was consulted and [facility] was asked to admit for further evaluation. Pt started on Vancomycin and Zosyn in ED.   Assessment/Plan: Severe sepsis /  Enterococcus + ?Morganella bacteremia / subhepatic abscess /  epigastric abscess/ liver abscess  Subhepatic, epigastric abscess drains out, largely resolved. Liver abscess drained   Still draining some.  cx: morganella ID following- changed abx to ampicillin for the Enterococcus and Ceftriaxone for the Moganelli -before pulling drain will need to re-scan -will need drain pulled before patient can return to SNF  Leukocytosis -resolved  Diabetes mellitus with neuropathy SSI and with meal coverage  Hypertension meds held  H/o Stroke w/ R sided weakness On ASA  Anemia of chronic disease Monitor- transfuse for < 7  Chronic steroid use - no need for stress dose steroids currently - unclear why pt is on chronic steroids (?arthritits)  -wean off outpatient  Thrush had sore throat as well which may be consistent with candida esophagitis - sx resolved. D/c diflucan  BPH - Flomax. Foley out  Sinus tachycardia, transient: post procedure. Resolved after saline bolus. D/c tele  Cough: CXR negative    Deconditioning: SNF once drain removed   Code Status: FULL Family Communication: LM for wife Disposition Plan: SNF   Consultants: Gen Surg  IR ID  Procedures: 2/27 - abdom abscess drain x2 in IR  PICC 3/13 liver  abscess drain by IR  Antibiotics: Ampicillin 3/02 >>3/9 Diflucan 3/01 >>  Zosyn 2/26 >> 3/02; restarted 3/9>> Vanc 2/26 >> 3/01  DVT prophylaxis: Lovenox  Subjective: No abd pain No SOB, no CP   Objective: Filed Weights   08/31/13 0608 09/01/13 0609 09/02/13 0551  Weight: 57.834 kg (127 lb 8 oz) 57.1 kg (125 lb 14.1 oz) 57.2 kg (126 lb 1.7 oz)   Blood pressure 133/68, pulse 81, temperature 98 F (36.7 C), temperature source Oral, resp. rate 17, height 5\' 5"  (1.651 m), weight 57.2 kg (126 lb 1.7 oz), SpO2 100.00%.  Intake/Output Summary (Last 24 hours) at 09/03/13 0919 Last data filed at 09/03/13 0606  Gross per 24 hour  Intake    435 ml  Output   1105 ml  Net   -670 ml    Exam: General: awake, pleasant/cooperative-  Lungs: Decreased breath sounds at the bases, no crackles or wheezes Cardiovascular: Regular rate and rhythm without murmur gallop or rub  Abdomen: dressings CDI. Soft, nontender, mild distention. Drain  Extremities: No significant cyanosis, clubbing, or edema bilateral lower extremities  Data Reviewed: Basic Metabolic Panel:  Recent Labs Lab 08/29/13 2025 08/30/13 1215 08/31/13 0542 09/01/13 0445 09/03/13 0512  NA 134*  --  133* 136* 136*  K 4.2  --  4.5 4.5 4.3  CL 98  --  97 101 99  CO2 25  --  27 26 25   GLUCOSE 128* 498* 200* 144* 160*  BUN 15  --  25* 19 15  CREATININE 1.10  --  1.04 0.92 0.81  CALCIUM 8.6  --  8.3* 8.6 9.1  MG 1.7  --   --   --   --  Liver Function Tests:  Recent Labs Lab 08/31/13 0542  AST 29  ALT 57*  ALKPHOS 213*  BILITOT 0.2*  PROT 6.5  ALBUMIN 1.7*   CBC:  Recent Labs Lab 08/29/13 2025 08/31/13 0542 09/01/13 0445 09/03/13 0512  WBC 22.2* 19.5* 10.9* 7.4  NEUTROABS 21.1* 17.0*  --   --   HGB 8.2* 7.0* 7.1* 8.3*  HCT 25.2* 22.0* 21.6* 25.8*  MCV 81.6 81.8 81.8 81.9  PLT 529* 397 308 361   Cardiac Enzymes: No results found for this basename: CKTOTAL, CKMB, CKMBINDEX, TROPONINI,  in the last  168 hours CBG:  Recent Labs Lab 09/02/13 0732 09/02/13 1214 09/02/13 1635 09/02/13 2142 09/03/13 0757  GLUCAP 142* 261* 243* 211* 184*    Recent Results (from the past 240 hour(s))  CULTURE, ROUTINE-ABSCESS     Status: None   Collection Time    08/29/13  3:01 PM      Result Value Ref Range Status   Specimen Description ABSCESS LIVER   Final   Special Requests NONE   Final   Gram Stain     Final   Value: ABUNDANT WBC PRESENT,BOTH PMN AND MONONUCLEAR     NO SQUAMOUS EPITHELIAL CELLS SEEN     NO ORGANISMS SEEN     Performed at [facility]   Culture     Final   Value: ABUNDANT MORGANELLA MORGANII     Performed at [facility]   Report Status 09/01/2013 FINAL   Final   Organism ID, Bacteria MORGANELLA MORGANII   Final       Scheduled Meds:  Scheduled Meds: . ampicillin (OMNIPEN) IV  2 g Intravenous 4 times per day  . cefTRIAXone (ROCEPHIN)  IV  2 g Intravenous Q24H  . enoxaparin (LOVENOX) injection  40 mg Subcutaneous Q24H  . feeding supplement (GLUCERNA SHAKE)  237 mL Oral TID BM  . insulin aspart  0-20 Units Subcutaneous TID WC  . insulin aspart  0-5 Units Subcutaneous QHS  . insulin aspart  3 Units Subcutaneous TID WC  . insulin glargine  10 Units Subcutaneous QHS  . latanoprost  1 drop Both Eyes QHS  . multivitamin with minerals  1 tablet Oral Daily  . polyethylene glycol  17 g Oral Daily  . predniSONE  5 mg Oral Q breakfast  . tamsulosin  0.4 mg Oral BID   Time spent on care of this patient: 25 min  Rosette Bellavance, [name], DO [facility]  09/03/2013, 9:19 AM   LOS: 20 days

## 2013-09-03 NOTE — Progress Notes (Addendum)
Subjective: Alert and stable. Afebrile. No tachycardia. Slight pain at the drain site, right upper quadrant, but otherwise has no abdominal pain. Tolerating diet and swallowing well. States he has 1 loose stool per day.  WBC 7,400.  JP drainage 27 cc last 24 hours. Serosanguineous.  On ampicillin and Rocephin. Grew enterococcus and Morganelli from cultures  Objective: Vital signs in last 24 hours: Temp:  [98 F (36.7 C)-98.7 F (37.1 C)] 98 F (36.7 C) (03/18 0504) Pulse Rate:  [81-85] 81 (03/18 0504) Resp:  [17-18] 17 (03/18 0504) BP: (128-136)/(67-68) 133/68 mmHg (03/18 0504) SpO2:  [100 %] 100 % (03/18 0504) Last BM Date: 09/02/13  Intake/Output from previous day: 03/17 0701 - 03/18 0700 In: 255 [P.O.:240] Out: 1105 [Urine:1100; Drains:5] Intake/Output this shift: Total I/O In: 10 [Other:10] Out: 850 [Urine:850]    EXAM: General appearance: alert and cooperative. Depressed affect. deconditioned. In no distress. GI: abdomen soft and nontender except right at the right upper quadrant drain site. No mass. No distention. Benign exam.  Lab Results:   Recent Labs  09/01/13 0445  WBC 10.9*  HGB 7.1*  HCT 21.6*  PLT 308   BMET  Recent Labs  09/01/13 0445  NA 136*  K 4.5  CL 101  CO2 26  GLUCOSE 144*  BUN 19  CREATININE 0.92  CALCIUM 8.6   PT/INR No results found for this basename: LABPROT, INR,  in the last 72 hours ABG No results found for this basename: PHART, PCO2, PO2, HCO3,  in the last 72 hours  Studies/Results: No results found.  Anti-infectives: Anti-infectives   Start     Dose/Rate Route Frequency Ordered Stop   09/01/13 1500  cefTRIAXone (ROCEPHIN) 2 g in dextrose 5 % 50 mL IVPB     2 g 100 mL/hr over 30 Minutes Intravenous Every 24 hours 09/01/13 1432     09/01/13 1500  ampicillin (OMNIPEN) 2 g in sodium chloride 0.9 % 50 mL IVPB    Comments:  Pharmacy may adjust   2 g 150 mL/hr over 20 Minutes Intravenous 4 times per day 09/01/13  1432     08/25/13 1400  piperacillin-tazobactam (ZOSYN) IVPB 3.375 g  Status:  Discontinued     3.375 g 12.5 mL/hr over 240 Minutes Intravenous 3 times per day 08/25/13 1359 09/01/13 1432   08/18/13 1645  fluconazole (DIFLUCAN) tablet 100 mg  Status:  Discontinued     100 mg Oral Daily 08/18/13 1641 08/30/13 1334   08/18/13 1200  ampicillin (OMNIPEN) 2 g in sodium chloride 0.9 % 50 mL IVPB  Status:  Discontinued    Comments:  Pharmacy may adjust as needed   2 g 150 mL/hr over 20 Minutes Intravenous 4 times per day 08/18/13 1049 08/25/13 1300   08/17/13 1000  fluconazole (DIFLUCAN) IVPB 100 mg  Status:  Discontinued     100 mg 50 mL/hr over 60 Minutes Intravenous Every 24 hours 08/17/13 0936 08/18/13 1641   08/16/13 1100  vancomycin (VANCOCIN) 500 mg in sodium chloride 0.9 % 100 mL IVPB  Status:  Discontinued     500 mg 100 mL/hr over 60 Minutes Intravenous Every 12 hours 08/16/13 1016 08/18/13 1049   08/15/13 1200  vancomycin (VANCOCIN) 1,250 mg in sodium chloride 0.9 % 250 mL IVPB  Status:  Discontinued     1,250 mg 166.7 mL/hr over 90 Minutes Intravenous Every 24 hours 08/14/13 1911 08/16/13 1016   08/15/13 0000  piperacillin-tazobactam (ZOSYN) IVPB 3.375 g  Status:  Discontinued  3.375 g 12.5 mL/hr over 240 Minutes Intravenous Every 8 hours 08/14/13 1911 08/18/13 1049   08/14/13 1530  vancomycin (VANCOCIN) IVPB 1000 mg/200 mL premix     1,000 mg 200 mL/hr over 60 Minutes Intravenous  Once 08/14/13 1526 08/14/13 1823   08/14/13 1530  piperacillin-tazobactam (ZOSYN) IVPB 3.375 g     3.375 g 100 mL/hr over 30 Minutes Intravenous  Once 08/14/13 1526 08/14/13 1603      Assessment/Plan:  1. Laparoscopic cholecystectomy and extensive LOA - 07/25/2013 - H. Lonny Eisen   2.    a)  Gall bladder fossa/intraperitoneal  abscess  Drainage 08/15/2013 - drains out and extrahepatic fluid collections resolved.  Enterococcus in blood  On amp,ceftriaxone - ID following         b)  Intraparenchymal  liver abscess. Drainage 08/29/2013   Repeat CT scan when drainage subsides  Recommend that his drains be removed prior to transfer back to SNF.   3. Thrush  Clinically resolved. swallowing better.   4. History of polio and CVA with right sided weaknwss 5. IDDM  6. Arthritis - on chronic prednisone  7. DVT prophylaxis - Lovenox  8. Right pleural effusion  9. Anemia        Hgb - 8.3 - 09/03/2013 - better 10. Remote history laparotomy for SBO 11. Chronic constipation and ileus     LOS: 20 days    Isidra Mings M 09/03/2013

## 2013-09-04 ENCOUNTER — Inpatient Hospital Stay (HOSPITAL_COMMUNITY): Payer: Medicare Other

## 2013-09-04 LAB — GLUCOSE, CAPILLARY
GLUCOSE-CAPILLARY: 191 mg/dL — AB (ref 70–99)
GLUCOSE-CAPILLARY: 218 mg/dL — AB (ref 70–99)
GLUCOSE-CAPILLARY: 216 mg/dL — AB (ref 70–99)
Glucose-Capillary: 139 mg/dL — ABNORMAL HIGH (ref 70–99)
Glucose-Capillary: 197 mg/dL — ABNORMAL HIGH (ref 70–99)

## 2013-09-04 MED ORDER — IOHEXOL 300 MG/ML  SOLN
80.0000 mL | Freq: Once | INTRAMUSCULAR | Status: AC | PRN
  Administered 2013-09-04: 80 mL via INTRAVENOUS

## 2013-09-04 NOTE — Progress Notes (Signed)
Progress Note    Mike Mack HKV:[id number] DOB: Nov 23, 1935 DOA: 08/14/2013 PCP: Mike Bolt, MD  Brief narrative: 78 y.o. male who presented on 08/14/2013 with a hx of HTN, HLD, prior stroke, recent laparoscopic cholecystectomy 07/25/2013 by Dr. Dalbert Mack (discharged to [facility] 07/30/2013) with several days duration of persistent epigastric abdominal pain associated with fevers, chills, and malaise.   In the ED CT abdomen was notable for abscess in the gallbladder bed, as well as peri-hepatic abscesses. Surgery was consulted and [facility] was asked to admit for further evaluation.    Assessment/Plan: Severe sepsis /  Enterococcus + ?Morganella bacteremia / subhepatic abscess /  epigastric abscess/ liver abscess  Subhepatic, epigastric abscess drains out, largely resolved. Liver abscess drained   Still draining some. before pulling drain will need to re-scan -will need drain pulled before patient can return to SNF cx: morganella/enterococcus ID following- changed abx to ampicillin for the Enterococcus and Ceftriaxone for the Moganelli  Leukocytosis -resolved  Diabetes mellitus with neuropathy SSI and with meal coverage  Hypertension meds held  H/o Stroke w/ R sided weakness On ASA  Anemia of chronic disease Monitor- transfuse for < 7  Chronic steroid use - no need for stress dose steroids currently - unclear why pt is on chronic steroids (?arthritits)  -wean off outpatient  Thrush had sore throat as well which may be consistent with candida esophagitis - sx resolved. D/c diflucan  BPH - Flomax. Foley out  Sinus tachycardia, transient: post procedure. Resolved after saline bolus. D/c tele  Cough: CXR negative    Deconditioning: SNF once drain removed   Code Status: FULL Family Communication: spoke with wife Disposition Plan: SNF   Consultants: Gen Surg  IR ID  Procedures: 2/27 - abdom abscess drain x2 in IR  PICC 3/13 liver abscess drain by  IR  Antibiotics: Ampicillin 3/02 >>3/9 Diflucan 3/01 >>  Zosyn 2/26 >> 3/02; restarted 3/9>> Vanc 2/26 >> 3/01  DVT prophylaxis: Lovenox  Subjective: Eating about 50% of meals No complaints   Objective: Filed Weights   08/31/13 0608 09/01/13 0609 09/02/13 0551  Weight: 57.834 kg (127 lb 8 oz) 57.1 kg (125 lb 14.1 oz) 57.2 kg (126 lb 1.7 oz)   Blood pressure 129/69, pulse 80, temperature 98.5 F (36.9 C), temperature source Oral, resp. rate 16, height 5\' 5"  (1.651 m), weight 57.2 kg (126 lb 1.7 oz), SpO2 98.00%.  Intake/Output Summary (Last 24 hours) at 09/04/13 0856 Last data filed at 09/04/13 0543  Gross per 24 hour  Intake    475 ml  Output    415 ml  Net     60 ml    Exam: General: awake, pleasant/cooperative- flat affect Lungs: Decreased breath sounds at the bases, no crackles or wheezes Cardiovascular: Regular rate and rhythm without murmur gallop or rub  Abdomen: dressings CDI. Soft, nontender, mild distention. Drain  Extremities: No significant cyanosis, clubbing, or edema bilateral lower extremities  Data Reviewed: Basic Metabolic Panel:  Recent Labs Lab 08/29/13 2025 08/30/13 1215 08/31/13 0542 09/01/13 0445 09/03/13 0512  NA 134*  --  133* 136* 136*  K 4.2  --  4.5 4.5 4.3  CL 98  --  97 101 99  CO2 25  --  27 26 25   GLUCOSE 128* 498* 200* 144* 160*  BUN 15  --  25* 19 15  CREATININE 1.10  --  1.04 0.92 0.81  CALCIUM 8.6  --  8.3* 8.6 9.1  MG 1.7  --   --   --   --  Liver Function Tests:  Recent Labs Lab 08/31/13 0542  AST 29  ALT 57*  ALKPHOS 213*  BILITOT 0.2*  PROT 6.5  ALBUMIN 1.7*   CBC:  Recent Labs Lab 08/29/13 2025 08/31/13 0542 09/01/13 0445 09/03/13 0512  WBC 22.2* 19.5* 10.9* 7.4  NEUTROABS 21.1* 17.0*  --   --   HGB 8.2* 7.0* 7.1* 8.3*  HCT 25.2* 22.0* 21.6* 25.8*  MCV 81.6 81.8 81.8 81.9  PLT 529* 397 308 361   Cardiac Enzymes: No results found for this basename: CKTOTAL, CKMB, CKMBINDEX, TROPONINI,  in  the last 168 hours CBG:  Recent Labs Lab 09/03/13 0757 09/03/13 1218 09/03/13 1654 09/03/13 2106 09/04/13 0732  GLUCAP 184* 203* 184* 174* 139*    Recent Results (from the past 240 hour(s))  CULTURE, ROUTINE-ABSCESS     Status: None   Collection Time    08/29/13  3:01 PM      Result Value Ref Range Status   Specimen Description ABSCESS LIVER   Final   Special Requests NONE   Final   Gram Stain     Final   Value: ABUNDANT WBC PRESENT,BOTH PMN AND MONONUCLEAR     NO SQUAMOUS EPITHELIAL CELLS SEEN     NO ORGANISMS SEEN     Performed at [facility]   Culture     Final   Value: ABUNDANT MORGANELLA MORGANII     Performed at [facility]   Report Status 09/01/2013 FINAL   Final   Organism ID, Bacteria MORGANELLA MORGANII   Final       Scheduled Meds:  Scheduled Meds: . ampicillin (OMNIPEN) IV  2 g Intravenous 4 times per day  . cefTRIAXone (ROCEPHIN)  IV  2 g Intravenous Q24H  . enoxaparin (LOVENOX) injection  40 mg Subcutaneous Q24H  . feeding supplement (GLUCERNA SHAKE)  237 mL Oral TID BM  . insulin aspart  0-20 Units Subcutaneous TID WC  . insulin aspart  0-5 Units Subcutaneous QHS  . insulin aspart  3 Units Subcutaneous TID WC  . insulin glargine  10 Units Subcutaneous QHS  . latanoprost  1 drop Both Eyes QHS  . multivitamin with minerals  1 tablet Oral Daily  . polyethylene glycol  17 g Oral Daily  . predniSONE  5 mg Oral Q breakfast  . tamsulosin  0.4 mg Oral BID   Time spent on care of this patient: 25 min  Mike Mack, Mike [place], DO Mike Mack [facility]  09/04/2013, 8:56 AM   LOS: 21 days

## 2013-09-04 NOTE — Progress Notes (Signed)
Physical Therapy Treatment Patient Details Name: Mike Mack MRN: [id number] DOB: 02-10-1936 Today's Date: 09/04/2013 Time: 4696-2952 PT Time Calculation (min): 17 min  PT Assessment / Plan / Recommendation  History of Present Illness pt has had lap chole surgery 07/25/13. Pt returned from SNF with n/v/fever and abd pain. Pt found on ct to have biloma arising from surgical bed; posterior Rt hepatic lobe abscess; L hepatic lobe abscess extending beneath abd wall.    PT Comments   Patient continues to require significant assist for mobility.  Attempted standing x3 and then total assist to transfer to bed   Follow Up Recommendations  SNF           Equipment Recommendations  Other (comment) (TBD by next venue of care)       Frequency Min 2X/week   Progress towards PT Goals Progress towards PT goals: Progressing toward goals (very modestly)  Plan Current plan remains appropriate    Precautions / Restrictions Precautions Precautions: Fall Precaution Comments: JP drain Rt.  Restrictions Weight Bearing Restrictions: No   Pertinent Vitals/Pain No pain right now just very weak per patient     Mobility  Bed Mobility Overal bed mobility: Needs Assistance Bed Mobility: Sit to Supine Sit to supine: Min assist General bed mobility comments: assist for LE elevation Transfers Overall transfer level: Needs assistance Equipment used: Rolling walker (2 wheeled) Transfers: Sit to/from [facility] Sit to Stand: Max assist (partial stand from chair) Stand pivot transfers: Total assist;+2 physical assistance (face to face) General transfer comment: Pt only able to achieve partial standing with max A +1 from recliner    Exercises General Exercises - Lower Extremity Ankle Circles/Pumps: 15 reps   PT Diagnosis:    PT Problem List:   PT Treatment Interventions:     PT Goals (current goals can now be found in the care plan section) Acute Rehab PT Goals PT Goal  Formulation: With patient Time For Goal Achievement: 09/03/13 Potential to Achieve Goals: Fair  Visit Information  Last PT Received On: 09/04/13 Assistance Needed: +2 History of Present Illness: pt has had lap chole surgery 07/25/13. Pt returned from SNF with n/v/fever and abd pain. Pt found on ct to have biloma arising from surgical bed; posterior Rt hepatic lobe abscess; L hepatic lobe abscess extending beneath abd wall.     Subjective Data  Subjective: patient states he is just very weak   Cognition  Cognition Arousal/Alertness: Awake/alert Behavior During Therapy: WFL for tasks assessed/performed Overall Cognitive Status: No family/caregiver present to determine baseline cognitive functioning       End of Session PT - End of Session Equipment Utilized During Treatment: Gait belt Activity Tolerance: Patient limited by fatigue Patient left: in bed;with call bell/phone within reach;with bed alarm set Nurse Communication: Mobility status   GP     Duncan Dull 09/04/2013, [place], [place] DPT  [phone]

## 2013-09-04 NOTE — Clinical Social Work Note (Signed)
CSW continuing to follow for SNF placement at [facility] once pt is medically stable for discharge.   Pati Gallo, [place] Social Worker [phone]

## 2013-09-04 NOTE — Progress Notes (Signed)
Subjective: Pt sitting up in chair; no new c/o; some soreness noted at hepatic drain site  Objective: Vital signs in last 24 hours: Temp:  [98.3 F (36.8 C)-98.5 F (36.9 C)] 98.5 F (36.9 C) (03/19 0418) Pulse Rate:  [76-86] 80 (03/19 0418) Resp:  [16-19] 16 (03/19 0418) BP: (121-132)/(66-70) 129/69 mmHg (03/19 0418) SpO2:  [98 %-100 %] 98 % (03/19 0418) Last BM Date: 09/04/13  Intake/Output from previous day: 03/18 0701 - 03/19 0700 In: 475 [P.O.:470] Out: 415 [Urine:375; Drains:40] Intake/Output this shift:    Rt hepatic drain intact, insertion site mildly tender, about 10 cc's bloody fluid in JP bulb; cx's- morganella morganii  Lab Results:   Recent Labs  09/03/13 0512  WBC 7.4  HGB 8.3*  HCT 25.8*  PLT 361   BMET  Recent Labs  09/03/13 0512  NA 136*  K 4.3  CL 99  CO2 25  GLUCOSE 160*  BUN 15  CREATININE 0.81  CALCIUM 9.1   PT/INR No results found for this basename: LABPROT, INR,  in the last 72 hours ABG No results found for this basename: PHART, PCO2, PO2, HCO3,  in the last 72 hours  Studies/Results: No results found.  Anti-infectives: Anti-infectives   Start     Dose/Rate Route Frequency Ordered Stop   09/01/13 1500  cefTRIAXone (ROCEPHIN) 2 g in dextrose 5 % 50 mL IVPB     2 g 100 mL/hr over 30 Minutes Intravenous Every 24 hours 09/01/13 1432     09/01/13 1500  ampicillin (OMNIPEN) 2 g in sodium chloride 0.9 % 50 mL IVPB    Comments:  Pharmacy may adjust   2 g 150 mL/hr over 20 Minutes Intravenous 4 times per day 09/01/13 1432     08/25/13 1400  piperacillin-tazobactam (ZOSYN) IVPB 3.375 g  Status:  Discontinued     3.375 g 12.5 mL/hr over 240 Minutes Intravenous 3 times per day 08/25/13 1359 09/01/13 1432   08/18/13 1645  fluconazole (DIFLUCAN) tablet 100 mg  Status:  Discontinued     100 mg Oral Daily 08/18/13 1641 08/30/13 1334   08/18/13 1200  ampicillin (OMNIPEN) 2 g in sodium chloride 0.9 % 50 mL IVPB  Status:  Discontinued     Comments:  Pharmacy may adjust as needed   2 g 150 mL/hr over 20 Minutes Intravenous 4 times per day 08/18/13 1049 08/25/13 1300   08/17/13 1000  fluconazole (DIFLUCAN) IVPB 100 mg  Status:  Discontinued     100 mg 50 mL/hr over 60 Minutes Intravenous Every 24 hours 08/17/13 0936 08/18/13 1641   08/16/13 1100  vancomycin (VANCOCIN) 500 mg in sodium chloride 0.9 % 100 mL IVPB  Status:  Discontinued     500 mg 100 mL/hr over 60 Minutes Intravenous Every 12 hours 08/16/13 1016 08/18/13 1049   08/15/13 1200  vancomycin (VANCOCIN) 1,250 mg in sodium chloride 0.9 % 250 mL IVPB  Status:  Discontinued     1,250 mg 166.7 mL/hr over 90 Minutes Intravenous Every 24 hours 08/14/13 1911 08/16/13 1016   08/15/13 0000  piperacillin-tazobactam (ZOSYN) IVPB 3.375 g  Status:  Discontinued     3.375 g 12.5 mL/hr over 240 Minutes Intravenous Every 8 hours 08/14/13 1911 08/18/13 1049   08/14/13 1530  vancomycin (VANCOCIN) IVPB 1000 mg/200 mL premix     1,000 mg 200 mL/hr over 60 Minutes Intravenous  Once 08/14/13 1526 08/14/13 1823   08/14/13 1530  piperacillin-tazobactam (ZOSYN) IVPB 3.375 g  3.375 g 100 mL/hr over 30 Minutes Intravenous  Once 08/14/13 1526 08/14/13 1603      Assessment/Plan: s/p rt hepatic abscess drainage 3/13; rec f/u CT to assess adequacy of drainage  LOS: 21 days    ALLRED,D [facility] 09/04/2013

## 2013-09-05 DIAGNOSIS — K651 Peritoneal abscess: Secondary | ICD-10-CM

## 2013-09-05 LAB — GLUCOSE, CAPILLARY
GLUCOSE-CAPILLARY: 290 mg/dL — AB (ref 70–99)
Glucose-Capillary: 197 mg/dL — ABNORMAL HIGH (ref 70–99)
Glucose-Capillary: 229 mg/dL — ABNORMAL HIGH (ref 70–99)
Glucose-Capillary: 156 mg/dL — ABNORMAL HIGH (ref 70–99)

## 2013-09-05 MED ORDER — SULFAMETHOXAZOLE-TMP DS 800-160 MG PO TABS
2.0000 | ORAL_TABLET | Freq: Two times a day (BID) | ORAL | Status: DC
  Administered 2013-09-05 – 2013-09-06 (×2): 2 via ORAL
  Filled 2013-09-05 (×3): qty 2

## 2013-09-05 NOTE — Progress Notes (Signed)
Subjective: Hepatic abscess drain placed 3/13 Pt doing well CT 3/19 revealed resolution per Dr Kathlene Cote CCS and ID agree for removal.  Objective: Vital signs in last 24 hours: Temp:  [97.8 F (36.6 C)-98.4 F (36.9 C)] 98.2 F (36.8 C) (03/19 2101) Pulse Rate:  [81-97] 89 (03/19 2101) Resp:  [16-18] 18 (03/19 2101) BP: (122-126)/(61-70) 122/61 mmHg (03/19 2101) SpO2:  [99 %-100 %] 100 % (03/19 2101) Last BM Date: 09/04/13  Intake/Output from previous day: 03/19 0701 - 03/20 0700 In: 48 [P.O.:480; IV Piggyback:100] Out: 571 [Urine:550; Drains:20; Stool:1] Intake/Output this shift:    PE:  Afeb; vss Wbc wnl CT 3/19: resolution Drain removed at bedside Dressing placed   Lab Results:   Recent Labs  09/03/13 0512  WBC 7.4  HGB 8.3*  HCT 25.8*  PLT 361   BMET  Recent Labs  09/03/13 0512  NA 136*  K 4.3  CL 99  CO2 25  GLUCOSE 160*  BUN 15  CREATININE 0.81  CALCIUM 9.1   PT/INR No results found for this basename: LABPROT, INR,  in the last 72 hours ABG No results found for this basename: PHART, PCO2, PO2, HCO3,  in the last 72 hours  Studies/Results: Ct Abdomen W Contrast  09/04/2013   CLINICAL DATA:  Status post drainage of hepatic abscess.  EXAM: CT ABDOMEN WITH CONTRAST  TECHNIQUE: Multidetector CT imaging of the abdomen was performed using the standard protocol following bolus administration of intravenous contrast.  CONTRAST:  80 mL OMNIPAQUE IOHEXOL 300 MG/ML  SOLN  COMPARISON:  CT abdomen 08/28/2013.  FINDINGS: Moderate right pleural effusion is unchanged. The no left pleural effusion. Small amount of pericardial fluid is noted.  Pigtail catheter is in place and localizes previously seen fluid collection within the liver. The collection had measured 6.7 x 4.8 x 5.9 cm on the prior examination and today measures 4.0 x 1.5 x 1.3 cm. A more peripheral collection off the anterior margin of the right hepatic lobe which had measured 1.7 x 4.9 x 2.4 cm  today measures 2.4 x 1.1 x 2.0 cm. Finally, subcapsular collection along the anterior margin of the liver containing air and fluid which had measured 4.2 x 0.9 cm in the axial plane today measures 3.8 x 1.0 cm. No new fluid collection is identified.  The adrenal glands and pancreas are unremarkable. The spleen has been removed. Small bilateral renal cysts are unchanged. Punctate nonobstructing stone lower pole of the right kidney is again seen. Visualized bowel loops are unremarkable. Normal No focal bony abnormality is identified.  IMPRESSION: Marked decrease in the size of a fluid collection in the right lobe of the liver as described with a drainage catheter in place.  Interval decrease in size of the subcapsular air and fluid collection along the right hepatic lobe. A third fluid collection more inferiorly along the right hepatic lobe is also decreased in size. No new fluid collection is present.  No change in a moderate right pleural effusion. Small pericardial fusion is also noted.   Electronically Signed   By: Inge Rise M.D.   On: 09/04/2013 21:21    Anti-infectives: Anti-infectives   Start     Dose/Rate Route Frequency Ordered Stop   09/01/13 1500  cefTRIAXone (ROCEPHIN) 2 g in dextrose 5 % 50 mL IVPB     2 g 100 mL/hr over 30 Minutes Intravenous Every 24 hours 09/01/13 1432     09/01/13 1500  ampicillin (OMNIPEN) 2 g in sodium chloride 0.9 %  50 mL IVPB    Comments:  Pharmacy may adjust   2 g 150 mL/hr over 20 Minutes Intravenous 4 times per day 09/01/13 1432     08/25/13 1400  piperacillin-tazobactam (ZOSYN) IVPB 3.375 g  Status:  Discontinued     3.375 g 12.5 mL/hr over 240 Minutes Intravenous 3 times per day 08/25/13 1359 09/01/13 1432   08/18/13 1645  fluconazole (DIFLUCAN) tablet 100 mg  Status:  Discontinued     100 mg Oral Daily 08/18/13 1641 08/30/13 1334   08/18/13 1200  ampicillin (OMNIPEN) 2 g in sodium chloride 0.9 % 50 mL IVPB  Status:  Discontinued    Comments:   Pharmacy may adjust as needed   2 g 150 mL/hr over 20 Minutes Intravenous 4 times per day 08/18/13 1049 08/25/13 1300   08/17/13 1000  fluconazole (DIFLUCAN) IVPB 100 mg  Status:  Discontinued     100 mg 50 mL/hr over 60 Minutes Intravenous Every 24 hours 08/17/13 0936 08/18/13 1641   08/16/13 1100  vancomycin (VANCOCIN) 500 mg in sodium chloride 0.9 % 100 mL IVPB  Status:  Discontinued     500 mg 100 mL/hr over 60 Minutes Intravenous Every 12 hours 08/16/13 1016 08/18/13 1049   08/15/13 1200  vancomycin (VANCOCIN) 1,250 mg in sodium chloride 0.9 % 250 mL IVPB  Status:  Discontinued     1,250 mg 166.7 mL/hr over 90 Minutes Intravenous Every 24 hours 08/14/13 1911 08/16/13 1016   08/15/13 0000  piperacillin-tazobactam (ZOSYN) IVPB 3.375 g  Status:  Discontinued     3.375 g 12.5 mL/hr over 240 Minutes Intravenous Every 8 hours 08/14/13 1911 08/18/13 1049   08/14/13 1530  vancomycin (VANCOCIN) IVPB 1000 mg/200 mL premix     1,000 mg 200 mL/hr over 60 Minutes Intravenous  Once 08/14/13 1526 08/14/13 1823   08/14/13 1530  piperacillin-tazobactam (ZOSYN) IVPB 3.375 g     3.375 g 100 mL/hr over 30 Minutes Intravenous  Once 08/14/13 1526 08/14/13 1603      Assessment/Plan: s/p * No surgery found * Hepatic drain removed without complication Pt will continue antibx per Dr Linus Salmons   LOS: 22 days    Kyair Ditommaso A 09/05/2013

## 2013-09-05 NOTE — Progress Notes (Signed)
Results for MAXXON, SCHWANKE (MRN [id number]) as of 09/05/2013 14:05  Ref. Range 09/04/2013 21:06 09/04/2013 21:44 09/05/2013 07:56 09/05/2013 11:31  Glucose-Capillary Latest Range: 70-99 mg/dL 218 (H) 216 (H) 197 (H) 229 (H)   Postprandial blood sugars continue to be elevated above 180 mg/dl.  If patient eating at least 50%, consider increasing Novolog meal coverage to 4 units TID.  Will follow.  Harvel Ricks RN BSN CDE

## 2013-09-05 NOTE — Progress Notes (Signed)
NUTRITION FOLLOW-UP  INTERVENTION:  Continue Glucerna Shake PO TID, each supplement provides 220 kcal and 10 grams of protein  Continue snack once daily  Encourage PO intake  Continue MVI daily  NUTRITION DIAGNOSIS: Increased nutrient needs related to wound healing as evidenced by estimated nutrition needs. Ongoing  Goal: Pt to meet >/= 90% of their estimated nutrition needs; unmet  Monitor:  PO intake and supplement tolerance, weight, labs, I/O's  ASSESSMENT: 78 yo male with history of diabetes, HTN, HLD, prior stroke, recent laparoscopic cholecystectomy 07/25/2013 by [name], and discharged to [facility] 07/30/2013, now presented to [facility] ED with main concern of several days duration of persistent epigastric abdominal pain, throbbing and 5/10 in severity, non radiating, associated with fevers, chills, malaise, poor oral intake, no specific aggravating or alleviating factors.   Pt with severe sepsis s/p cholecystectomy; now s/p 2 perc drains.  3/10: Advanced to Dysphagia 1 diet (purees) 3/3 and dysphagia 2 diet 3/4. Per nursing notes pt has been eating 15-50% of most meals. Per order history pt has been accepting most Ensure supplements BID. Pt's lunch tray was in room, mostly untouched. Pt drinking Ensure Complete at time of visit; states he has no appetite. He reports drinking 1 or 2 Ensure shakes daily. Encouraged pt to eat more and discussed additional nutritional supplement options.   3/16: Pt's diet was advanced to Carb Modified 3/14. Per nursing notes pt continues to eat 15-50% of meals. Pt has been drinking Ensure Complete BID and Glucerna Shake at night but, blood glucose has been high. Pt agreeable to changing all supplements to Glucerna. Pt reports eating well today and RN confirms pt report.  Pt's weight continues to trend down with additional 1 lb wt loss this past week.   3/20: Patient continues on a Carb Modified diet with Glucerna Shakes TID between meals. He reports that his  appetite has improved and he is consuming at least half of his meals and drinking supplemental shakes at least twice daily. RN confirms that patient is eating pretty good and like the supplements. Weight stable.  Height: Ht Readings from Last 1 Encounters:  08/16/13 5\' 5"  (1.651 m)    Weight: Wt Readings from Last 1 Encounters:  09/02/13 126 lb 1.7 oz (57.2 kg)  09/01/13  125 lb 14.1 oz (57.1 kg)  08/24/13 126 lb Admit wt 131 lb   BMI:  Body mass index is 20.98 kg/(m^2). WNL  Estimated Nutritional Needs: Kcal: 1650-1850 Protein: 75-85 gm Fluid: 1.6-1.8 L  Skin: deep tissue injury to R heel  Diet Order: Carb Control with Glucerna Shake PO TID between meals    Intake/Output Summary (Last 24 hours) at 09/05/13 1101 Last data filed at 09/05/13 0900  Gross per 24 hour  Intake    710 ml  Output    370 ml  Net    340 ml    Last BM: 3/19  Labs:   Recent Labs Lab 08/29/13 2025  08/31/13 0542 09/01/13 0445 09/03/13 0512  NA 134*  --  133* 136* 136*  K 4.2  --  4.5 4.5 4.3  CL 98  --  97 101 99  CO2 25  --  27 26 25   BUN 15  --  25* 19 15  CREATININE 1.10  --  1.04 0.92 0.81  CALCIUM 8.6  --  8.3* 8.6 9.1  MG 1.7  --   --   --   --   GLUCOSE 128*  < > 200* 144* 160*  < > =  values in this interval not displayed.  CBG (last 3)   Recent Labs  09/04/13 2106 09/04/13 2144 09/05/13 0756  GLUCAP 218* 216* 197*    Scheduled Meds: . ampicillin (OMNIPEN) IV  2 g Intravenous 4 times per day  . cefTRIAXone (ROCEPHIN)  IV  2 g Intravenous Q24H  . enoxaparin (LOVENOX) injection  40 mg Subcutaneous Q24H  . feeding supplement (GLUCERNA SHAKE)  237 mL Oral TID BM  . insulin aspart  0-20 Units Subcutaneous TID WC  . insulin aspart  0-5 Units Subcutaneous QHS  . insulin aspart  3 Units Subcutaneous TID WC  . insulin glargine  10 Units Subcutaneous QHS  . latanoprost  1 drop Both Eyes QHS  . multivitamin with minerals  1 tablet Oral Daily  . polyethylene glycol  17 g  Oral Daily  . predniSONE  5 mg Oral Q breakfast  . tamsulosin  0.4 mg Oral BID    Continuous Infusions: None    Molli Barrows, RD, LDN, [place] Pager [phone] After Hours Pager [phone]

## 2013-09-05 NOTE — Clinical Social Work Note (Signed)
CSW has faxed updated clinicals to [email or web address]. Per admissions liaison at [facility], pt will be able to be admitted to their facility over weekend if pt is medically stable for discharge. CSW has left weekend CSW a handoff regarding this information.  Pati Gallo, [place] Social Worker [phone]

## 2013-09-05 NOTE — Progress Notes (Signed)
Occupational Therapy Treatment Patient Details Name: Mike Mack MRN: [id number] DOB: 1936/03/25 Today's Date: 09/05/2013 Time: 8299-3716 OT Time Calculation (min): 21 min  OT Assessment / Plan / Recommendation  History of present illness pt has had lap chole surgery 07/25/13. Pt returned from SNF with n/v/fever and abd pain. Pt found on ct to have biloma arising from surgical bed; posterior Rt hepatic lobe abscess; L hepatic lobe abscess extending beneath abd wall.    OT comments  Pt seen for ADL retraining session today w/ focus on self feeding sitting upright in bed. Pt requires assistance for set up, positioning (pillow under left arm for midline sitting) and HOB elevated. Reviewed OT goals w/ pt and spouse as well. Continue to recommend SNF, 24/assist.   Follow Up Recommendations  SNF;Supervision/Assistance - 24 hour    Barriers to Discharge       Equipment Recommendations  Other (comment) (Defer to next venue)    Recommendations for Other Services    Frequency Min 2X/week   Progress towards OT Goals Progress towards OT goals: Progressing toward goals  Plan Discharge plan remains appropriate    Precautions / Restrictions Precautions Precautions: Fall Restrictions Weight Bearing Restrictions: No   Pertinent Vitals/Pain No c/o, denies pain.    ADL  Eating/Feeding: Performed;Set up;Min guard Where Assessed - Eating/Feeding: Bed level ADL Comments: Pt seen today for self feeding sitting up in bed. Pt requires assistance w/ set-up (opening packages, cutting food, applying seasonings etc) and positioning of plate/tray. Pt also required assistance to sit upright in bed, secondary to left sided lean - pillow was placed under pt's left side to allow midline sitting. Discussed & reviewed OT goals w/ pt's spouse and pt today at bedside while eating. Pt should benefit from functional activity to assist w/ increasing activity tolerance, endurance and functional mobility.     OT  Diagnosis:    OT Problem List:   OT Treatment Interventions:     OT Goals(current goals can now be found in the care plan section)    Visit Information  Last OT Received On: 09/05/13 Assistance Needed: +2 History of Present Illness: pt has had lap chole surgery 07/25/13. Pt returned from SNF with n/v/fever and abd pain. Pt found on ct to have biloma arising from surgical bed; posterior Rt hepatic lobe abscess; L hepatic lobe abscess extending beneath abd wall.     Subjective Data      Prior Functioning       Cognition  Cognition Arousal/Alertness: Awake/alert Behavior During Therapy: WFL for tasks assessed/performed Overall Cognitive Status: Difficult to assess    Mobility  Bed Mobility Overal bed mobility: Needs Assistance Bed Mobility: Supine to Sit Supine to sit: Max assist;HOB elevated General bed mobility comments: Pt required max assistance to place pillow on left side (for midline position) while sitting up in bed to eat lunch today.            End of Session OT - End of Session Activity Tolerance: Patient limited by fatigue Patient left: in bed;with call bell/phone within reach;with family/visitor present  GO     [name] 09/05/2013, 1:27 PM

## 2013-09-05 NOTE — Progress Notes (Signed)
ANTIBIOTIC CONSULT NOTE - INITIAL  Pharmacy Consult for Bactrim Indication: Morganella morganii liver abscess  No Known Allergies  Patient Measurements: Height: 5\' 5"  (165.1 cm) Weight: 126 lb 1.7 oz (57.2 kg) IBW/kg (Calculated) : 61.5  Vital Signs:   Intake/Output from previous day: 03/19 0701 - 03/20 0700 In: 590 [P.O.:480; IV Piggyback:100] Out: 571 [Urine:550; Drains:20; Stool:1] Intake/Output from this shift: Total I/O In: 360 [P.O.:360] Out: -   Labs:  Recent Labs  09/03/13 0512  WBC 7.4  HGB 8.3*  PLT 361  CREATININE 0.81   Estimated Creatinine Clearance: 63.8 ml/min (by C-G formula based on Cr of 0.81). No results found for this basename: VANCOTROUGH, Corlis Leak, VANCORANDOM, [place], GENTPEAK, [place], [place], TOBRAPEAK, TOBRARND, AMIKACINPEAK, AMIKACINTROU, AMIKACIN,  in the last 72 hours   Microbiology: Recent Results (from the past 720 hour(s))  URINE CULTURE     Status: None   Collection Time    08/14/13 12:17 AM      Result Value Ref Range Status   Specimen Description URINE, CATHETERIZED   Final   Special Requests NONE   Final   Culture  Setup Time     Final   Value: 08/15/2013 03:29     Performed at [place]     Final   Value: NO GROWTH     Performed at [facility]   Culture     Final   Value: NO GROWTH     Performed at [facility]   Report Status 08/16/2013 FINAL   Final  CULTURE, BLOOD (ROUTINE X 2)     Status: None   Collection Time    08/14/13  2:38 PM      Result Value Ref Range Status   Specimen Description BLOOD ARM LEFT   Final   Special Requests BOTTLES DRAWN AEROBIC AND ANAEROBIC 5CC   Final   Culture  Setup Time     Final   Value: 08/14/2013 20:43     Performed at [facility]   Culture     Final   Value: ENTEROCOCCUS SPECIES     Note: COMBINATION THERAPY OF HIGH DOSE AMPICILLIN OR VANCOMYCIN, PLUS AN AMINOGLYCOSIDE, IS USUALLY INDICATED FOR SERIOUS ENTEROCOCCAL  INFECTIONS.     MORGANELLA MORGANII     Note: Gram Stain Report Called to,Read Back By and Verified With: MORGAN SCHMITZ 08/15/12 0815 BY SMITHERSJ CRITICAL RESULT CALLED TO, READ BACK BY AND VERIFIED WITH: ROSEMARY CLARK 2/29/15 @ 10:46AM BY RUSCOE A.     Performed at [facility]   Report Status 08/18/2013 FINAL   Final   Organism ID, Bacteria ENTEROCOCCUS SPECIES   Final   Organism ID, Bacteria MORGANELLA MORGANII   Final  CULTURE, BLOOD (ROUTINE X 2)     Status: None   Collection Time    08/14/13  2:52 PM      Result Value Ref Range Status   Specimen Description BLOOD RIGHT FOREARM   Final   Special Requests BOTTLES DRAWN AEROBIC AND ANAEROBIC 5CC   Final   Culture  Setup Time     Final   Value: 08/14/2013 20:43     Performed at [facility]   Culture     Final   Value: ENTEROCOCCUS SPECIES     Note: SUSCEPTIBILITIES PERFORMED ON PREVIOUS CULTURE WITHIN THE LAST 5 DAYS.     Note: Gram Stain Report Called to,Read Back By and Verified With: MORGAN SCHMITZ 08/15/13 0815 BY SMITHERSJ  Performed at [facility]   Report Status 08/17/2013 FINAL   Final  RAPID STREP SCREEN     Status: None   Collection Time    08/15/13 11:42 AM      Result Value Ref Range Status   Streptococcus, Group A Screen (Direct) NEGATIVE  NEGATIVE Final   Comment: (NOTE)     A Rapid Antigen test may result negative if the antigen level in the     sample is below the detection level of this test. The FDA has not     cleared this test as a stand-alone test therefore the rapid antigen     negative result has reflexed to a Group A Strep culture.  CULTURE, GROUP A STREP     Status: None   Collection Time    08/15/13 11:42 AM      Result Value Ref Range Status   Specimen Description THROAT   Final   Special Requests NONE   Final   Culture     Final   Value: No Beta Hemolytic Streptococci Isolated     Performed at [facility]   Report Status 08/17/2013 FINAL   Final  CULTURE,  ROUTINE-ABSCESS     Status: None   Collection Time    08/15/13  6:17 PM      Result Value Ref Range Status   Specimen Description ABSCESS GALL BLADDER   Final   Special Requests RUQ IN THE GB FOSSA,SPECIMEN NO 1   Final   Gram Stain     Final   Value: FEW WBC PRESENT,BOTH PMN AND MONONUCLEAR     NO SQUAMOUS EPITHELIAL CELLS SEEN     RARE GRAM POSITIVE COCCI IN PAIRS     RARE GRAM POSITIVE RODS     Performed at [facility]   Culture     Final   Value: MULTIPLE ORGANISMS PRESENT, NONE PREDOMINANT     Note: NO STAPHYLOCOCCUS AUREUS ISOLATED NO GROUP A STREP (S.PYOGENES) ISOLATED     Performed at [facility]   Report Status 08/19/2013 FINAL   Final  CULTURE, ROUTINE-ABSCESS     Status: None   Collection Time    08/15/13  6:52 PM      Result Value Ref Range Status   Specimen Description ABSCESS   Final   Special Requests     Final   Value: MID EPIGASTRUM IN THE ANTERIOR PERI GASTRIC SAMPLE NO 2   Gram Stain     Final   Value: ABUNDANT WBC PRESENT, PREDOMINANTLY PMN     NO SQUAMOUS EPITHELIAL CELLS SEEN     ABUNDANT GRAM POSITIVE COCCI IN PAIRS     Performed at [facility]   Culture     Final   Value: ABUNDANT ENTEROCOCCUS SPECIES     Performed at [facility]   Report Status 08/19/2013 FINAL   Final   Organism ID, Bacteria ENTEROCOCCUS SPECIES   Final  CULTURE, BLOOD (ROUTINE X 2)     Status: None   Collection Time    08/16/13  1:50 PM      Result Value Ref Range Status   Specimen Description BLOOD LEFT ARM   Final   Special Requests BOTTLES DRAWN AEROBIC AND ANAEROBIC 10CC   Final   Culture  Setup Time     Final   Value: 08/16/2013 23:31     Performed at [facility]   Culture     Final   Value:  NO GROWTH 5 DAYS     Performed at [facility]   Report Status 08/22/2013 FINAL   Final  CULTURE, BLOOD (ROUTINE X 2)     Status: None   Collection Time    08/16/13  1:55 PM      Result Value Ref Range Status   Specimen  Description BLOOD LEFT ARM   Final   Special Requests BOTTLES DRAWN AEROBIC AND ANAEROBIC 10CC   Final   Culture  Setup Time     Final   Value: 08/16/2013 23:31     Performed at [facility]   Culture     Final   Value: NO GROWTH 5 DAYS     Performed at [facility]   Report Status 08/22/2013 FINAL   Final  CULTURE, ROUTINE-ABSCESS     Status: None   Collection Time    08/29/13  3:01 PM      Result Value Ref Range Status   Specimen Description ABSCESS LIVER   Final   Special Requests NONE   Final   Gram Stain     Final   Value: ABUNDANT WBC PRESENT,BOTH PMN AND MONONUCLEAR     NO SQUAMOUS EPITHELIAL CELLS SEEN     NO ORGANISMS SEEN     Performed at [facility]   Culture     Final   Value: ABUNDANT MORGANELLA MORGANII     Performed at [facility]   Report Status 09/01/2013 FINAL   Final   Organism ID, Bacteria MORGANELLA MORGANII   Final    Medical History: Past Medical History  Diagnosis Date  . Hypertension   . Small bowel obstruction   . BPH (benign prostatic hyperplasia)   . Neuropathy   . Polio   . Polio Childhood  . Constipation 05/13/2013  . Anemia 05/13/2013  . Type II diabetes mellitus   . Exertional shortness of breath     "sometimes" (07/25/2013)  . History of stomach ulcers   . Stroke 2014    residual:  "left hand kind of numb" (07/25/2013)  . Arthritis     "right leg" (07/25/2013)    Medications:  Prescriptions prior to admission  Medication Sig Dispense Refill  . [EXPIRED] Ciprofloxacin (CIPRO IV) Inject 400 mg into the vein 2 (two) times daily. Med to stop on 08/17/13      . docusate sodium 100 MG CAPS Take 100 mg by mouth 2 (two) times daily.  10 capsule  0  . gabapentin (NEURONTIN) 800 MG tablet Take 800 mg by mouth 3 (three) times daily as needed (HEADACHES).       Marland Kitchen glucagon (GLUCAGEN) 1 MG SOLR injection Inject 1 mg into the vein once as needed for low blood sugar.      . insulin aspart (NOVOLOG) 100 UNIT/ML  injection Inject 5 Units into the skin 3 (three) times daily before meals.       . insulin glargine (LANTUS) 100 units/mL SOLN Inject 5 Units into the skin at bedtime.       . irbesartan (AVAPRO) 300 MG tablet Take 300 mg by mouth at bedtime.      . metroNIDAZOLE (FLAGYL) 5-0.79 MG/ML-% IVPB Inject 500 mg into the vein every 6 (six) hours.      Marland Kitchen nystatin (MYCOSTATIN) 100000 UNIT/ML suspension Take 5 mLs by mouth 2 (two) times daily. For 1 week, then re-access if need to continue, last dose due on 2/26      . polyethylene  glycol (MIRALAX / GLYCOLAX) packet Take 17 g by mouth 3 (three) times daily.  14 each  0  . predniSONE (DELTASONE) 5 MG tablet Take 5 mg by mouth daily with breakfast.      . Tamsulosin HCl (FLOMAX) 0.4 MG CAPS Take 0.4 mg by mouth 2 (two) times daily.       . Travoprost, BAK Free, (TRAVATAN) 0.004 % SOLN ophthalmic solution Place 1 drop into both eyes at bedtime.       Assessment: 78 y.o. male being followed by ID MD for abd and liver abscess. 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 Pt currently on ampicillin and ceftriaxone. Plan to change to IV ampicillin and po Bactrim for 3 weeks after JP removal. Pharmacy asked to assist with po Bactrim dosing. Pt with CrCl ~65 ml/min.  Goal of Therapy:  Resolution of infection  Plan:  1. Bactrim DS 2 tablets po BID 2. Will f/u renal function, pt's clinical condition  Sherlon Handing, PharmD, BCPS Clinical pharmacist, pager [phone] 09/05/2013,1:22 PM

## 2013-09-05 NOTE — Progress Notes (Signed)
Chart reviewed.    Progress Note    Mike Mack WUJ:[id number] DOB: 07-Feb-1936 DOA: 08/14/2013 PCP: [name], MD  Brief narrative: 78 y.o. male who presented on 08/14/2013 with a hx of HTN, HLD, prior stroke, recent laparoscopic cholecystectomy 07/25/2013 by [name] (discharged to [facility] 07/30/2013) with several days duration of persistent epigastric abdominal pain associated with fevers, chills, and malaise.   In the ED CT abdomen was notable for abscess in the gallbladder bed, as well as peri-hepatic abscesses. Surgery was consulted and [facility] was asked to admit for further evaluation.    Assessment/Plan: Severe sepsis /  Enterococcus + ?Morganella bacteremia / subhepatic abscess /  epigastric abscess/ liver abscess All drains out. Monitor 24 hours. SNF tomorrow on 3 weeks IV ampicillin and bactrim PO  Leukocytosis -resolved  Diabetes mellitus with neuropathy SSI and with meal coverage  Hypertension meds held  H/o Stroke w/ R sided weakness On ASA  Anemia of chronic disease Monitor- transfuse for < 7  Chronic steroid use - no need for stress dose steroids currently - unclear why pt is on chronic steroids (?arthritits)  -wean off outpatient  Thrush had sore throat as well which may be consistent with candida esophagitis - sx resolved. D/c diflucan  BPH - Flomax. Foley out  Sinus tachycardia, transient: post procedure. Resolved after saline bolus. D/c tele  Cough: CXR negative    Deconditioning: SNF once drain removed   Code Status: FULL Family Communication: spoke with wife Disposition Plan: SNF   Consultants: Gen Surg  IR ID  Procedures: 2/27 - abdom abscess drain x2 in IR  PICC 3/13 liver abscess drain by IR  Antibiotics: Ampicillin 3/02 >>3/9 Diflucan 3/01 >>  Zosyn 2/26 >> 3/02; restarted 3/9>> Vanc 2/26 >> 3/01  DVT prophylaxis: Lovenox  Subjective: Feels better. Minimal pain   Objective: Filed Weights   08/31/13 0608  09/01/13 0609 09/02/13 0551  Weight: 57.834 kg (127 lb 8 oz) 57.1 kg (125 lb 14.1 oz) 57.2 kg (126 lb 1.7 oz)   Blood pressure 124/62, pulse 82, temperature 98.7 F (37.1 C), temperature source Oral, resp. rate 16, height 5\' 5"  (1.651 m), weight 57.2 kg (126 lb 1.7 oz), SpO2 100.00%.  Intake/Output Summary (Last 24 hours) at 09/05/13 2146 Last data filed at 09/05/13 1900  Gross per 24 hour  Intake    940 ml  Output    100 ml  Net    840 ml    Exam: General: eating supper. nontoxic Lungs: Decreased breath sounds at the bases, no crackles or wheezes Cardiovascular: Regular rate and rhythm without murmur gallop or rub  Abdomen: dressings CDI. Soft, nontender, ND Extremities: No significant cyanosis, clubbing, or edema bilateral lower extremities  Data Reviewed: Basic Metabolic Panel:  Recent Labs Lab 08/30/13 1215 08/31/13 0542 09/01/13 0445 09/03/13 0512  NA  --  133* 136* 136*  K  --  4.5 4.5 4.3  CL  --  97 101 99  CO2  --  27 26 25   GLUCOSE 498* 200* 144* 160*  BUN  --  25* 19 15  CREATININE  --  1.04 0.92 0.81  CALCIUM  --  8.3* 8.6 9.1   Liver Function Tests:  Recent Labs Lab 08/31/13 0542  AST 29  ALT 57*  ALKPHOS 213*  BILITOT 0.2*  PROT 6.5  ALBUMIN 1.7*   CBC:  Recent Labs Lab 08/31/13 0542 09/01/13 0445 09/03/13 0512  WBC 19.5* 10.9* 7.4  NEUTROABS 17.0*  --   --  HGB 7.0* 7.1* 8.3*  HCT 22.0* 21.6* 25.8*  MCV 81.8 81.8 81.9  PLT 397 308 361   Cardiac Enzymes: No results found for this basename: CKTOTAL, CKMB, CKMBINDEX, TROPONINI,  in the last 168 hours CBG:  Recent Labs Lab 09/04/13 2106 09/04/13 2144 09/05/13 0756 09/05/13 1131 09/05/13 1654  GLUCAP 218* 216* 197* 229* 290*    Recent Results (from the past 240 hour(s))  CULTURE, ROUTINE-ABSCESS     Status: None   Collection Time    08/29/13  3:01 PM      Result Value Ref Range Status   Specimen Description ABSCESS LIVER   Final   Special Requests NONE   Final   Gram  Stain     Final   Value: ABUNDANT WBC PRESENT,BOTH PMN AND MONONUCLEAR     NO SQUAMOUS EPITHELIAL CELLS SEEN     NO ORGANISMS SEEN     Performed at [facility]   Culture     Final   Value: ABUNDANT MORGANELLA MORGANII     Performed at [facility]   Report Status 09/01/2013 FINAL   Final   Organism ID, Bacteria MORGANELLA MORGANII   Final       Scheduled Meds:  Scheduled Meds: . ampicillin (OMNIPEN) IV  2 g Intravenous 4 times per day  . enoxaparin (LOVENOX) injection  40 mg Subcutaneous Q24H  . feeding supplement (GLUCERNA SHAKE)  237 mL Oral TID BM  . insulin aspart  0-20 Units Subcutaneous TID WC  . insulin aspart  0-5 Units Subcutaneous QHS  . insulin aspart  3 Units Subcutaneous TID WC  . insulin glargine  10 Units Subcutaneous QHS  . latanoprost  1 drop Both Eyes QHS  . multivitamin with minerals  1 tablet Oral Daily  . polyethylene glycol  17 g Oral Daily  . predniSONE  5 mg Oral Q breakfast  . sulfamethoxazole-trimethoprim  2 tablet Oral Q12H  . tamsulosin  0.4 mg Oral BID   Time spent on care of this patient: 25 min  [name], MD  [facility]  09/05/2013, 9:46 PM   LOS: 22 days

## 2013-09-05 NOTE — Progress Notes (Addendum)
Mike Mack for Infectious Disease  Date of Admission:  08/14/2013  Antibiotics: Ampicillin ceftriaxone  Subjective: No complaints, drain removed  Objective: Temp:  [98.2 F (36.8 C)] 98.2 F (36.8 C) (03/19 2101) Pulse Rate:  [89] 89 (03/19 2101) Resp:  [18] 18 (03/19 2101) BP: (122)/(61) 122/61 mmHg (03/19 2101) SpO2:  [100 %] 100 % (03/19 2101)  General: Awake, alert, nad Skin: no rashes Lungs: CTA B Abdomen: soft Ext: no edema  Lab Results Lab Results  Component Value Date   WBC 7.4 09/03/2013   HGB 8.3* 09/03/2013   HCT 25.8* 09/03/2013   MCV 81.9 09/03/2013   PLT 361 09/03/2013    Lab Results  Component Value Date   CREATININE 0.81 09/03/2013   BUN 15 09/03/2013   NA 136* 09/03/2013   K 4.3 09/03/2013   CL 99 09/03/2013   CO2 25 09/03/2013    Lab Results  Component Value Date   ALT 57* 08/31/2013   AST 29 08/31/2013   ALKPHOS 213* 08/31/2013   BILITOT 0.2* 08/31/2013      Microbiology: Recent Results (from the past 240 hour(s))  CULTURE, ROUTINE-ABSCESS     Status: None   Collection Time    08/29/13  3:01 PM      Result Value Ref Range Status   Specimen Description ABSCESS LIVER   Final   Special Requests NONE   Final   Gram Stain     Final   Value: ABUNDANT WBC PRESENT,BOTH PMN AND MONONUCLEAR     NO SQUAMOUS EPITHELIAL CELLS SEEN     NO ORGANISMS SEEN     Performed at [facility]   Culture     Final   Value: ABUNDANT MORGANELLA MORGANII     Performed at [facility]   Report Status 09/01/2013 FINAL   Final   Organism ID, Bacteria MORGANELLA MORGANII   Final    Studies/Results: Ct Abdomen W Contrast  09/04/2013   CLINICAL DATA:  Status post drainage of hepatic abscess.  EXAM: CT ABDOMEN WITH CONTRAST  TECHNIQUE: Multidetector CT imaging of the abdomen was performed using the standard protocol following bolus administration of intravenous contrast.  CONTRAST:  80 mL OMNIPAQUE IOHEXOL 300 MG/ML  SOLN  COMPARISON:  CT abdomen  08/28/2013.  FINDINGS: Moderate right pleural effusion is unchanged. The no left pleural effusion. Small amount of pericardial fluid is noted.  Pigtail catheter is in place and localizes previously seen fluid collection within the liver. The collection had measured 6.7 x 4.8 x 5.9 cm on the prior examination and today measures 4.0 x 1.5 x 1.3 cm. A more peripheral collection off the anterior margin of the right hepatic lobe which had measured 1.7 x 4.9 x 2.4 cm today measures 2.4 x 1.1 x 2.0 cm. Finally, subcapsular collection along the anterior margin of the liver containing air and fluid which had measured 4.2 x 0.9 cm in the axial plane today measures 3.8 x 1.0 cm. No new fluid collection is identified.  The adrenal glands and pancreas are unremarkable. The spleen has been removed. Small bilateral renal cysts are unchanged. Punctate nonobstructing stone lower pole of the right kidney is again seen. Visualized bowel loops are unremarkable. Normal No focal bony abnormality is identified.  IMPRESSION: Marked decrease in the size of a fluid collection in the right lobe of the liver as described with a drainage catheter in place.  Interval decrease in size of the subcapsular air and fluid collection along the  right hepatic lobe. A third fluid collection more inferiorly along the right hepatic lobe is also decreased in size. No new fluid collection is present.  No change in a moderate right pleural effusion. Small pericardial fusion is also noted.   Electronically Signed   By: Inge Rise M.D.   On: 09/04/2013 21:21    Assessment/Plan: 1) abdominal and liver abscess - + Enterococcus, ampicillin sensitive and Morganelli ceftriaxone senstive.  No unifying antibiotics.  I will change him to the ampicillin for the Enterococcus and Ceftriaxone for the [facility].  Drain now out and was with just serosanguinous drainage.  I agree with surgery to have prolonged antibiotic course after JP removal and think at least 3  weeks.   -we will arrange follow up in [facility] in about 3 weeks -for his Enterococcus, we will need to use IV ampicillin as oral medicine not as effective -for the [facility], I will change him to Bactrim DS and continue both antibiotics for 3 weeks until seen in our clinic and if ok, can stop then -he already has a picc line   Please call with any questions, thanks   Mike Mack, [name] [facility] [name] [email or web address] [phone] pager   [phone] cell 09/05/2013, 1:09 PM

## 2013-09-05 NOTE — Progress Notes (Signed)
Subjective: Condition the about the same. Alert and stable. Afebrile. Minimal pain.  CT scan of abdomen yesterday shows significant Decrease in size of right lobe liver fluid collection. Also anterior subcapsular air fluid collection has gotten smaller.Right pleural effusion persists.  JP drains 20 cc last 24 hours. Still serosanguineous. No purulent fluids.  Remains on ampicillin and ceftriaxone. Grew enterococcus and Morganelli from cultures.  Objective: Vital signs in last 24 hours: Temp:  [97.8 F (36.6 C)-98.4 F (36.9 C)] 98.2 F (36.8 C) (03/19 2101) Pulse Rate:  [81-97] 89 (03/19 2101) Resp:  [16-18] 18 (03/19 2101) BP: (122-126)/(61-70) 122/61 mmHg (03/19 2101) SpO2:  [99 %-100 %] 100 % (03/19 2101) Last BM Date: 09/04/13  Intake/Output from previous day: 03/19 0701 - 03/20 0700 In: 37 [P.O.:480; IV Piggyback:100] Out: 571 [Urine:550; Drains:20; Stool:1] Intake/Output this shift: Total I/O In: 225 [P.O.:120; Other:5; IV Piggyback:100] Out: 210 [Urine:200; Drains:10]  General appearance: alert. Cooperative. Profoundly depressed affect. Her family deconditioning. No distress. GI: abdominal exam benign. Soft. Nondistended. Nontender. Drain site clean.  Lab Results:   Recent Labs  09/03/13 0512  WBC 7.4  HGB 8.3*  HCT 25.8*  PLT 361   BMET  Recent Labs  09/03/13 0512  NA 136*  K 4.3  CL 99  CO2 25  GLUCOSE 160*  BUN 15  CREATININE 0.81  CALCIUM 9.1   PT/INR No results found for this basename: LABPROT, INR,  in the last 72 hours ABG No results found for this basename: PHART, PCO2, PO2, HCO3,  in the last 72 hours  Studies/Results: Ct Abdomen W Contrast  09/04/2013   CLINICAL DATA:  Status post drainage of hepatic abscess.  EXAM: CT ABDOMEN WITH CONTRAST  TECHNIQUE: Multidetector CT imaging of the abdomen was performed using the standard protocol following bolus administration of intravenous contrast.  CONTRAST:  80 mL OMNIPAQUE IOHEXOL 300  MG/ML  SOLN  COMPARISON:  CT abdomen 08/28/2013.  FINDINGS: Moderate right pleural effusion is unchanged. The no left pleural effusion. Small amount of pericardial fluid is noted.  Pigtail catheter is in place and localizes previously seen fluid collection within the liver. The collection had measured 6.7 x 4.8 x 5.9 cm on the prior examination and today measures 4.0 x 1.5 x 1.3 cm. A more peripheral collection off the anterior margin of the right hepatic lobe which had measured 1.7 x 4.9 x 2.4 cm today measures 2.4 x 1.1 x 2.0 cm. Finally, subcapsular collection along the anterior margin of the liver containing air and fluid which had measured 4.2 x 0.9 cm in the axial plane today measures 3.8 x 1.0 cm. No new fluid collection is identified.  The adrenal glands and pancreas are unremarkable. The spleen has been removed. Small bilateral renal cysts are unchanged. Punctate nonobstructing stone lower pole of the right kidney is again seen. Visualized bowel loops are unremarkable. Normal No focal bony abnormality is identified.  IMPRESSION: Marked decrease in the size of a fluid collection in the right lobe of the liver as described with a drainage catheter in place.  Interval decrease in size of the subcapsular air and fluid collection along the right hepatic lobe. A third fluid collection more inferiorly along the right hepatic lobe is also decreased in size. No new fluid collection is present.  No change in a moderate right pleural effusion. Small pericardial fusion is also noted.   Electronically Signed   By: Inge Rise M.D.   On: 09/04/2013 21:21    Anti-infectives: Anti-infectives  Start     Dose/Rate Route Frequency Ordered Stop   09/01/13 1500  cefTRIAXone (ROCEPHIN) 2 g in dextrose 5 % 50 mL IVPB     2 g 100 mL/hr over 30 Minutes Intravenous Every 24 hours 09/01/13 1432     09/01/13 1500  ampicillin (OMNIPEN) 2 g in sodium chloride 0.9 % 50 mL IVPB    Comments:  Pharmacy may adjust   2 g 150  mL/hr over 20 Minutes Intravenous 4 times per day 09/01/13 1432     08/25/13 1400  piperacillin-tazobactam (ZOSYN) IVPB 3.375 g  Status:  Discontinued     3.375 g 12.5 mL/hr over 240 Minutes Intravenous 3 times per day 08/25/13 1359 09/01/13 1432   08/18/13 1645  fluconazole (DIFLUCAN) tablet 100 mg  Status:  Discontinued     100 mg Oral Daily 08/18/13 1641 08/30/13 1334   08/18/13 1200  ampicillin (OMNIPEN) 2 g in sodium chloride 0.9 % 50 mL IVPB  Status:  Discontinued    Comments:  Pharmacy may adjust as needed   2 g 150 mL/hr over 20 Minutes Intravenous 4 times per day 08/18/13 1049 08/25/13 1300   08/17/13 1000  fluconazole (DIFLUCAN) IVPB 100 mg  Status:  Discontinued     100 mg 50 mL/hr over 60 Minutes Intravenous Every 24 hours 08/17/13 0936 08/18/13 1641   08/16/13 1100  vancomycin (VANCOCIN) 500 mg in sodium chloride 0.9 % 100 mL IVPB  Status:  Discontinued     500 mg 100 mL/hr over 60 Minutes Intravenous Every 12 hours 08/16/13 1016 08/18/13 1049   08/15/13 1200  vancomycin (VANCOCIN) 1,250 mg in sodium chloride 0.9 % 250 mL IVPB  Status:  Discontinued     1,250 mg 166.7 mL/hr over 90 Minutes Intravenous Every 24 hours 08/14/13 1911 08/16/13 1016   08/15/13 0000  piperacillin-tazobactam (ZOSYN) IVPB 3.375 g  Status:  Discontinued     3.375 g 12.5 mL/hr over 240 Minutes Intravenous Every 8 hours 08/14/13 1911 08/18/13 1049   08/14/13 1530  vancomycin (VANCOCIN) IVPB 1000 mg/200 mL premix     1,000 mg 200 mL/hr over 60 Minutes Intravenous  Once 08/14/13 1526 08/14/13 1823   08/14/13 1530  piperacillin-tazobactam (ZOSYN) IVPB 3.375 g     3.375 g 100 mL/hr over 30 Minutes Intravenous  Once 08/14/13 1526 08/14/13 1603      Assessment/Plan:   1. Laparoscopic cholecystectomy and extensive LOA - 07/25/2013 - [facility]. [facility]   2. a) Gall bladder fossa/intraperitoneal abscess  Drainage 08/15/2013 - drains out and extrahepatic fluid collections resolved.  Enterococcus in blood  On  amp,ceftriaxone - ID following       b) Intraparenchymal liver abscess. Drainage 08/29/2013           As far as I am concerned, the drain can be removed, but he will need continued antibiotics for 3 or 4 weeks. I would make sure that IR  and infectious disease agree. Recommend that you check with infectious disease regarding duration of antibiotics, selection of antibiotics in the outpatient setting. Most likely he can be converted to oral antibiotics to complete his treatment. Recommend that his drains be removed prior to transfer back to SNF.    3. Thrush  Clinically resolved. swallowing better.   4. History of polio and CVA with right sided weaknwss  5. IDDM  6. Arthritis - on chronic prednisone  7. DVT prophylaxis - Lovenox  8. Right pleural effusion  9. Anemia  Hgb - 8.3 -  09/03/2013 - better  10. Remote history laparotomy for SBO  11. Chronic constipation and ileus    LOS: 22 days    Cyndee Giammarco M 09/05/2013

## 2013-09-06 DIAGNOSIS — N400 Enlarged prostate without lower urinary tract symptoms: Secondary | ICD-10-CM

## 2013-09-06 LAB — GLUCOSE, CAPILLARY
GLUCOSE-CAPILLARY: 141 mg/dL — AB (ref 70–99)
Glucose-Capillary: 175 mg/dL — ABNORMAL HIGH (ref 70–99)

## 2013-09-06 MED ORDER — POLYETHYLENE GLYCOL 3350 17 G PO PACK
17.0000 g | PACK | Freq: Every day | ORAL | Status: DC
Start: 2013-09-06 — End: 2022-08-30

## 2013-09-06 MED ORDER — HYDROCODONE-ACETAMINOPHEN 5-325 MG PO TABS
1.0000 | ORAL_TABLET | Freq: Four times a day (QID) | ORAL | Status: DC | PRN
Start: 2013-09-06 — End: 2013-09-06

## 2013-09-06 MED ORDER — ACETAMINOPHEN 325 MG PO TABS: 650.0000 mg | ORAL_TABLET | ORAL | Status: DC | PRN

## 2013-09-06 MED ORDER — ADULT MULTIVITAMIN W/MINERALS CH: 1.0000 | ORAL_TABLET | Freq: Every day | ORAL | Status: DC

## 2013-09-06 MED ORDER — SULFAMETHOXAZOLE-TMP DS 800-160 MG PO TABS: 2.0000 | ORAL_TABLET | Freq: Two times a day (BID) | ORAL | Status: DC

## 2013-09-06 MED ORDER — HYDROCODONE-ACETAMINOPHEN 5-325 MG PO TABS: 1.0000 | ORAL_TABLET | Freq: Four times a day (QID) | ORAL | Status: DC | PRN

## 2013-09-06 MED ORDER — GLUCERNA SHAKE PO LIQD: 237.0000 mL | Freq: Three times a day (TID) | ORAL | Status: DC

## 2013-09-06 MED ORDER — INSULIN GLARGINE 100 UNITS/ML SOLOSTAR PEN: 10.0000 [IU] | PEN_INJECTOR | Freq: Every day | SUBCUTANEOUS | Status: DC

## 2013-09-06 MED ORDER — HEPARIN SOD (PORK) LOCK FLUSH 100 UNIT/ML IV SOLN
250.0000 [IU] | INTRAVENOUS | Status: AC | PRN
  Administered 2013-09-06: 250 [IU]

## 2013-09-06 MED ORDER — SODIUM CHLORIDE 0.9 % IV SOLN: 2.0000 g | Freq: Four times a day (QID) | INTRAVENOUS | Status: DC

## 2013-09-06 MED ORDER — ONDANSETRON HCL 4 MG PO TABS: 4.0000 mg | ORAL_TABLET | Freq: Four times a day (QID) | ORAL | Status: DC | PRN

## 2013-09-06 NOTE — Discharge Summary (Signed)
Physician Discharge Summary  Mike Mack [id number] DOB: 14-Jan-1936 DOA: 08/14/2013  PCP: [name], MD  Admit date: 08/14/2013 Discharge date: 09/06/2013  Time spent: greater than 30 minutes  Recommendations for Outpatient Follow-up:  1. Monitor blood glucose qac 2. Monitor BMET weekly while on antibiotics  Discharge Diagnoses:  Principal Problem:   Severe sepsis Active Problems:   Diabetes mellitus without complication   Hypertension   H/o Stroke   Anemia or chronic disease   Right upper quadrant abdominal abscess Peri epigastric abscess   Hepatic abscess Protein calorie malnutrition Urinary retention thrush  Discharge Condition: stable  Filed Weights   08/31/13 0608 09/01/13 0609 09/02/13 0551  Weight: 57.834 kg (127 lb 8 oz) 57.1 kg (125 lb 14.1 oz) 57.2 kg (126 lb 1.7 oz)    History of present illness:  78 yo male with history of diabetes, HTN, HLD, prior stroke, recent laparoscopic cholecystectomy 07/25/2013 by [name], and discharged to [facility] 07/30/2013, now presented to [facility] ED with main concern of several days duration of persistent epigastric abdominal pain, throbbing and 5/10 in severity, non radiating, associated with fevers, chills, malaise, poor oral intake, no specific aggravating or alleviating factors. No similar events in the past. Please note that pt is poor historian and family at bedside helped with providing history. There has been reported exertional shortness of breath and chest tightness but details not available. Pt currently denies chest pain or shortness of breath.  In ED, CT abdomen notable for abscess in the gallbladder bed, hepatic abscess, and an additional fluid collection in the abdomen. Surgery consulted and [facility] asked to admit for further evaluation. Pt started on Vancomycin and Zosyn in ED.   Hospital Course:  Patient was admitted to step down unit, surgery consulted. Started on vancomycin and Zosyn. Interventional radiology  placed drains percutaneously in the right upper quadrant gallbladder fossa and anterior peri-epigastric abscess. Right upper quadrant abscess grew multiple bacterial morphotypes.. Epigastric abscess grew enterococcus species. Blood culture grew out enterococcus and Morganella morganii. Infectious disease was consulted and adjusted antibiotics. Repeat CAT scan showed near resolution of gallbladder fossa abscess and peri-epigastric abscess. These drains were subsequently removed. Liver abscess was noted to be enlarging on ampicillin, so antibiotics were changed to Zosyn and interventional radiology placed a drain in that abscess as well. Repeat scan was done after drainage had nearly resolved. Liver abscess showed almost complete resolution. The drain was removed. Infectious disease recommends 3 more weeks of ampicillin for enterococcus, and 3 weeks of Bactrim for Morganella infection. Patient will followup in the infectious disease clinic in 3 weeks and if clinically stable, antibiotics will be stopped. Patient has a PICC line placed during this hospitalization. He is debilitated and physical therapy and occupational therapy are recommending skilled nursing facility. He will go for rehabilitation today.  During hospitalization, patient had oral thrush and iodine aphasia at present to be candidal esophagitis. He responded well to nystatin and Diflucan.  He had an episode of urinary retention requiring Foley catheter placement. This has since resolved and he is voiding fine without a catheter.  Antihypertensives were held and he remains normotensive off medications.  Diabetes control was erratic, given his infection and insulin therapy adjusted. His blood glucoses have been in the 100s over the past several days.  On admission, patient was on 5 mg of prednisone. Unclear why. Consider weaning off as an outpatient if appropriate.  The patient was anemic but did not require transfusion. Workup consistent with  chronic disease.  Procedures: 2/27 - abdom abscess drain x2 in IR, removed PICC  3/13 liver abscess drain by IR, removed   Consultations:  General surgery  Interventional radiology  Infectious disease  PM&R  Discharge Exam: Filed Vitals:   09/06/13 0557  BP: 102/57  Pulse: 72  Temp: 98.7 F (37.1 C)  Resp: 16    General:  comfortable. Alert and oriented. Cardiovascular: regular rate rhythm without murmurs gallops rubs Respiratory: clear to auscultation bilaterally without wheezes rhonchi or rales   abdomen: The drain sites without erythema or drainage Extremities: No clubbing cyanosis or edema  Discharge Instructions  Discharge Orders   Future Orders Complete By Expires   Diet - low sodium heart healthy  As directed    Diet Carb Modified  As directed    No wound care  As directed    Scheduling Instructions:     Routine picc line care   Walk with assistance  As directed        Medication List    STOP taking these medications       CIPRO IV     DSS 100 MG Caps     GLUCAGEN 1 MG Solr injection  Generic drug:  glucagon     irbesartan 300 MG tablet  Commonly known as:  AVAPRO     metroNIDAZOLE 5-0.79 MG/ML-% IVPB  Commonly known as:  FLAGYL     nystatin 100000 UNIT/ML suspension  Commonly known as:  MYCOSTATIN      TAKE these medications       acetaminophen 325 MG tablet  Commonly known as:  TYLENOL  Take 2 tablets (650 mg total) by mouth every 4 (four) hours as needed for mild pain, fever or headache.     feeding supplement (GLUCERNA SHAKE) Liqd  Take 237 mLs by mouth 3 (three) times daily between meals.     gabapentin 800 MG tablet  Commonly known as:  NEURONTIN  Take 800 mg by mouth 3 (three) times daily as needed (HEADACHES).     HYDROcodone-acetaminophen 5-325 MG per tablet  Commonly known as:  NORCO/VICODIN  Take 1 tablet by mouth every 6 (six) hours as needed for moderate pain.     insulin aspart 100 UNIT/ML injection  Commonly  known as:  novoLOG  Inject 5 Units into the skin 3 (three) times daily before meals.     insulin glargine 100 units/mL Soln  Commonly known as:  LANTUS  Inject 10 Units into the skin at bedtime.     multivitamin with minerals Tabs tablet  Take 1 tablet by mouth daily.     ondansetron 4 MG tablet  Commonly known as:  ZOFRAN  Take 1 tablet (4 mg total) by mouth every 6 (six) hours as needed for nausea.     polyethylene glycol packet  Commonly known as:  MIRALAX / GLYCOLAX  Take 17 g by mouth daily.     predniSONE 5 MG tablet  Commonly known as:  DELTASONE  Take 5 mg by mouth daily with breakfast.     sodium chloride 0.9 % SOLN 50 mL with ampicillin 2 G SOLR 2 g  Inject 2 g into the vein every 6 (six) hours. Through 09/26/13     sulfamethoxazole-trimethoprim 800-160 MG per tablet  Commonly known as:  BACTRIM DS  Take 2 tablets by mouth every 12 (twelve) hours. Through 09/17/13     tamsulosin 0.4 MG Caps capsule  Commonly known as:  FLOMAX  Take 0.4 mg by mouth 2 (  two) times daily.     Travoprost (BAK Free) 0.004 % Soln ophthalmic solution  Commonly known as:  TRAVATAN  Place 1 drop into both eyes at bedtime.       No Known Allergies    The results of significant diagnostics from this hospitalization (including imaging, microbiology, ancillary and laboratory) are listed below for reference.    Significant Diagnostic Studies: Dg Chest 2 View  08/14/2013   CLINICAL DATA:  Fever, postoperative state  EXAM: CHEST  2 VIEW  COMPARISON:  DG CHEST 2 VIEW dated 07/28/2013  FINDINGS: There is a moderate-sized pleural effusion layering posteriorly on the right. A discrete alveolar infiltrate on the right is not demonstrated. The left lung is clear. The cardiac silhouette is normal in size. The pulmonary vascularity is not clearly engorged. The trachea is midline. The observed portions of the bony thorax exhibit no acute abnormality.  IMPRESSION: There is a moderate sized right-sided  pleural effusion layering posteriorly.   Electronically Signed   By: David  Martinique   On: 08/14/2013 14:18   Ct Abdomen W Contrast  09/04/2013   CLINICAL DATA:  Status post drainage of hepatic abscess.  EXAM: CT ABDOMEN WITH CONTRAST  TECHNIQUE: Multidetector CT imaging of the abdomen was performed using the standard protocol following bolus administration of intravenous contrast.  CONTRAST:  80 mL OMNIPAQUE IOHEXOL 300 MG/ML  SOLN  COMPARISON:  CT abdomen 08/28/2013.  FINDINGS: Moderate right pleural effusion is unchanged. The no left pleural effusion. Small amount of pericardial fluid is noted.  Pigtail catheter is in place and localizes previously seen fluid collection within the liver. The collection had measured 6.7 x 4.8 x 5.9 cm on the prior examination and today measures 4.0 x 1.5 x 1.3 cm. A more peripheral collection off the anterior margin of the right hepatic lobe which had measured 1.7 x 4.9 x 2.4 cm today measures 2.4 x 1.1 x 2.0 cm. Finally, subcapsular collection along the anterior margin of the liver containing air and fluid which had measured 4.2 x 0.9 cm in the axial plane today measures 3.8 x 1.0 cm. No new fluid collection is identified.  The adrenal glands and pancreas are unremarkable. The spleen has been removed. Small bilateral renal cysts are unchanged. Punctate nonobstructing stone lower pole of the right kidney is again seen. Visualized bowel loops are unremarkable. Normal No focal bony abnormality is identified.  IMPRESSION: Marked decrease in the size of a fluid collection in the right lobe of the liver as described with a drainage catheter in place.  Interval decrease in size of the subcapsular air and fluid collection along the right hepatic lobe. A third fluid collection more inferiorly along the right hepatic lobe is also decreased in size. No new fluid collection is present.  No change in a moderate right pleural effusion. Small pericardial fusion is also noted.   Electronically  Signed   By: Inge Rise M.D.   On: 09/04/2013 21:21   Ct Abdomen W Contrast  08/28/2013   CLINICAL DATA:  Liver abscess.  Evaluate for progression.  EXAM: CT ABDOMEN WITH CONTRAST  TECHNIQUE: Multidetector CT imaging of the abdomen was performed using the standard protocol following bolus administration of intravenous contrast.  CONTRAST:  36mL OMNIPAQUE IOHEXOL 300 MG/ML  SOLN  COMPARISON:  CT of the abdomen and pelvis 08/22/2013.  FINDINGS: Lung Bases: Incompletely visualized right pleural effusion which is moderate to large. Small left pleural effusion. Passive atelectasis in the visualized portions of the lung  bases bilaterally. Tip of a central venous catheter is noted in the superior aspect of the right atrium. Trace amount of pericardial fluid and/or thickening, unchanged, and unlikely to be of hemodynamic significance at this time.  Abdomen: Complex rim enhancing fluid collection measuring approximately 6.7 x 4.8 x 5.9 cm in the inferior aspect of the right lobe of the liver appears slightly larger than the prior examination from 08/22/2013. There is again a more peripheral portion of the collection in the anterior aspect of the liver (predominantly segment 4B), where there was previously a drainage catheter which has since been removed. The smaller portion of the collection measures approximately 4.9 x 1.7 cm (image 22 of series 2) and contain several internal locular of gas. Immediately adjacent to this overlying the anterior surface of the liver adjacent to segment 4A there is a small gas and fluid containing collection which is favored to be subcapsular (rather than pneumoperitoneum) which is essentially unchanged compared to the prior study, currently measuring 4.2 x 0.9 cm. No new hepatic collections are noted. Status post cholecystectomy. No intrahepatic biliary ductal dilatation. Other previously noted drainage catheter in the epigastric region has been removed.  The appearance of the  pancreas, spleen and bilateral adrenal glands is unremarkable. 3 mm nonobstructive calculus in the lower pole collecting system of the right kidney. Multiple low-attenuation lesions in the kidneys bilaterally, many of which are too small to definitively characterize. The largest of these lesions are all compatible with simple cysts, with the largest of these measuring 2.3 cm in diameter in the lower pole of the left kidney. Within the visualized portions of the peritoneal cavity there is no significant volume of ascites and no definite pneumoperitoneum. No pathologic distention of small bowel. No lymphadenopathy.  Musculoskeletal: There are no aggressive appearing lytic or blastic lesions noted in the visualized portions of the skeleton.  IMPRESSION: 1. Interval enlargement of a complex multilocular rim enhancing fluid collection in the inferior aspect of the right lobe of the liver, compatible with progressive enlargement of an intrahepatic abscess. 2. Previously noted peripheral abscess in segment 4B at site of prior drain placement is again noted, currently measuring 1.7 x 4.9 cm with several tiny locules of internal gas. There is also a small gas and fluid collection which appears to be subcapsular overlying the mass which is essentially unchanged compared to the prior study. Previously noted drainage catheters have both been removed. 3. Moderate to large right pleural effusion and small left pleural effusion are both unchanged, with extensive passive atelectasis in the lower lobes of the lungs bilaterally (right greater than left). 4. Trace amount of pericardial fluid and/or thickening, unlikely to be of hemodynamic significance at this time. This finding is unchanged. 5. Additional incidental findings, as above.   Electronically Signed   By: Vinnie Langton M.D.   On: 08/28/2013 13:07   Nm Hepatobiliary Liver Func  08/15/2013   CLINICAL DATA:  Abdominal pain, post cholecystectomy.  EXAM: NUCLEAR MEDICINE  HEPATOBILIARY IMAGING  TECHNIQUE: Sequential images of the abdomen were obtained out to 60 minutes following intravenous administration of radiopharmaceutical.  COMPARISON:  Abdominal CT 08/14/2013  RADIOPHARMACEUTICALS:  5 mCi Tc-48m Choletec  FINDINGS: The radiopharmaceutical was taken up by the liver and excreted into the biliary system. There are areas of photopenia involving the central right hepatic lobe related to the known fluid or abscess collections. There is no evidence for a bile leak. Activity is identified in the small bowel.  IMPRESSION: No evidence for  a bile leak.  Photopenia areas in the liver are most compatible with known fluid or abscess collections.   Electronically Signed   By: Markus Daft M.D.   On: 08/15/2013 09:34   Ct Abdomen Pelvis W Contrast  08/22/2013   CLINICAL DATA:  Prior percutaneous abscess drainage of right upper quadrant biloma/abscess. Now for follow-up.  EXAM: CT ABDOMEN AND PELVIS WITH CONTRAST  TECHNIQUE: Multidetector CT imaging of the abdomen and pelvis was performed using the standard protocol following bolus administration of intravenous contrast.  CONTRAST:  158mL OMNIPAQUE IOHEXOL 300 MG/ML SOLN. There was extravasation of contrast during injection. Approximately 80-90 cc of contrast was extravasated into the patient's right upper arm. Upon evaluation, the patient was without complaint. Patient has pulses in the right upper extremity were strong and normal. Pulses, sensory and motor function were normal and symmetric to the opposite side. The patient will be followed by the radiology PA. Orders were placed in the patient's chart.  COMPARISON:  CT ABD/PELVIS W CM dated 08/14/2013  FINDINGS: Stable moderate to large right pleural effusion. New small left pleural effusion. Compressive atelectasis in the lower lobes bilaterally.  Patient is status post placement of 2 upper abdominal abscess drainage catheters, 1 in the postsurgical bed adjacent to the liver and the second  in the midline anteriorly within the upper abdomen. The fluid collections in these areas have nearly completely resolved. A small residual fluid and gas collection anterior to the liver measures approximately 4.4 x 0.9 cm. This is smaller than prior study and presumably communicates with the previously seen large fluid collection in the gallbladder fossa. No measurable fluid collection noted adjacent to the midline drainage catheter in the subxiphoid region.  There is a complex appearing low-density area within the right hepatic lobe posterior to the gallbladder fossa. This currently measures up to 6 cm. This previously measured up to 3.1 cm. With the contrast extravasation that occurred during the study, this is essentially a noncontrast study making it difficult to evaluate this lesion, but this has enlarged since prior study and appears complex. Cannot exclude hepatic abscess.  Appendix is visualized and is normal. Stomach, large and small bowel grossly unremarkable. Prostate is enlarged. Foley catheter is in place within the bladder which is otherwise grossly unremarkable.  Spleen, pancreas, adrenals have an unremarkable unenhanced appearance. Small low-density lesions in the kidneys bilaterally were shown on prior contrast study to represent cysts. No hydronephrosis. No free fluid or adenopathy.  No acute bony abnormality.  IMPRESSION: Near complete resolution of the large fluid collection in the gallbladder fossa with drainage catheter in place. Only a small amount of fluid and gas remain present anterior to the liver edge.  No measurable fluid remains in the subxiphoid region adjacent to the midline drainage catheter.  Enlarging low-density area in the right hepatic lobe. Cannot exclude enlarging hepatic abscess.  Moderate to large right pleural effusion and small left pleural effusion. Compressive atelectasis in the lower lobes.   Electronically Signed   By: Rolm Baptise M.D.   On: 08/22/2013 15:32   Ct  Abdomen Pelvis W Contrast  08/14/2013   CLINICAL DATA:  Fever, abdominal pain, status post lap cholecystectomy 1 week ago  EXAM: CT ABDOMEN AND PELVIS WITH CONTRAST  TECHNIQUE: Multidetector CT imaging of the abdomen and pelvis was performed using the standard protocol following bolus administration of intravenous contrast.  CONTRAST:  1102mL OMNIPAQUE IOHEXOL 300 MG/ML  SOLN  COMPARISON:  CT abdomen pelvis dated 05/13/2013  FINDINGS:  Moderate right and trace left pleural effusions. Associated lower lobe atelectasis.  Status post cholecystectomy. Complex fluid collection/abscess arising from the surgical bed and extending along the medial segment left hepatic lobe to the anterior hepatic dome (series 2/image 11). Collection is multiloculated and difficult to discretely measure, but is approximately 7.4 x 6.5 x 9.4 cm (series 2/ image 22; coronal image 32). Foci of gas within the collection, some of which is nondependent. This appearance is compatible with a postoperative abscess or possibly an infected biloma.  Adjacent 3.1 x 2.5 x 3.6 cm fluid collection/abscess in the posterior segment right hepatic lobe (series 2/image 26). Additional bilobed fluid collection inferior to the left hepatic lobe and beneath the anterior abdominal wall, measuring approximately 3.0 x 3.5 x 3.2 cm along its right aspect (series 2/ image 23) and 4.3 x 4.8 x 6.8 cm along its left aspect (series 2/image 29; coronal image 15).  Liver is otherwise unremarkable.  Spleen, pancreas, and adrenal glands are within normal limits.  Bilateral renal cysts, measuring up to 2.1 cm in the posterior right upper pole (series 2/ image 30) and 2.2 cm in the medial left lower pole (series 2/image 38). No hydronephrosis.  No evidence of bowel obstruction.  Normal appendix.  No evidence of abdominal aortic aneurysm.  No abdominopelvic ascites.  No suspicious abdominopelvic lymphadenopathy.  Prostate is notable for enlargement of the central gland which  indents the base of the bladder.  Dystrophic calcifications just posterior to the right bladder (series 2/image 80). Bladder is otherwise unremarkable  IMPRESSION: Status post cholecystectomy.  9.4 cm multiloculated abscess/infected biloma arising from the surgical bed, extending along the medial segment left hepatic lobe, and overlying the left hepatic dome.  Adjacent 3.6 cm abscess in the posterior segment right hepatic lobe.  Adjacent bilobed abscess extending from the left hepatic lobe to beneath the anterior abdominal wall, measuring up to 6.5 cm in maximal dimension.  Foci of gas within the dominant collection.  No free air.  These results will be called to the ordering clinician or representative by the Radiologist Assistant, and communication documented in the PACS Dashboard.   Electronically Signed   By: Julian Hy M.D.   On: 08/14/2013 17:08   Dg Chest Port 1 View  08/29/2013   CLINICAL DATA:  Cough  EXAM: PORTABLE CHEST - 1 VIEW  COMPARISON:  08/14/2013  FINDINGS: Cardiac shadow is stable. A left-sided PICC line is noted. It overlies the midline due to patient rotation to the left. It lies in the superior aspect of the right atrium. Small bilateral pleural effusions are noted. Mild bibasilar atelectasis is noted.  IMPRESSION: Bibasilar changes as described.  Left PICC line with the catheter tip in the proximal right atrium. This could be withdrawn 1-2 cm as necessary.   Electronically Signed   By: Inez Catalina M.D.   On: 08/29/2013 19:29   Ct Image Guided Drainage Percut Cath  Peritoneal Retroperit  08/15/2013   ADDENDUM REPORT: 08/15/2013 19:08  ADDENDUM: 3 mg Versed said and 100 mcg fentanyl were administered intravenously during a moderate sedation.   Electronically Signed   By: Jacqulynn Cadet M.D.   On: 08/15/2013 19:08   08/15/2013   CLINICAL DATA:  78 year old male with multifocal intra-abdominal abscesses status post laparoscopic cholecystectomy. He requires percutaneous drainage.   EXAM: CT GUIDED DRAINAGE OF gallbladder fossa/ perihepatic abscess ABSCESS; CT-guided drainage of perigastric abscess  ANESTHESIA/SEDATION: Versed said and fentanyl were administered intravenously.  Total Moderate Sedation Time:  40 minutes  PROCEDURE: The procedure, risks, benefits, and alternatives were explained to the patient. Questions regarding the procedure were encouraged and answered. The patient understands and consents to the procedure.  A planning axial CT scan was performed. Both the perigastric fluid collection in the mid epigastrium and the gallbladder fossa and perihepatic collection in the right upper quadrant were successfully identified. Suitable skin entry site was selected and marked.  The abdomen was prepped with Betadinein a sterile fashion, and a sterile drape was applied covering the operative field. A sterile gown and sterile gloves were used for the procedure. Local anesthesia was provided with 1% Lidocaine.  Attention was first turned to the right upper quadrant. Using intermittent CT fluoroscopic guidance, an 18 gauge trocar needle was carefully advanced over rib and superiorly over the colon and up into the gallbladder fossa fluid collection. An 035 wire was coiled in the tract serially dilated to 12 [place]. A Cook 12 [place] all-purpose drainage catheter was advanced over the wire and formed within the fluid collection. Approximately 200 mL of purulent, brown, bilious fluid was aspirated. The drain was connected to JP bulb suction and sutured in place with a 0 silk suture.  Attention was next turned to the abscess collection anterior to the gastric antrum in the mid epigastric region. Again, with intermittent CT fluoroscopic guidance a trocar needle was advanced into the fluid collection followed by a wire. The tract was dilated to 10 [place] and a [place] all-purpose drainage catheter was formed within the fluid collection. Approximately 70 mL of thick, frankly purulent fluid was  aspirated. The cavity was lavaged with sterile normal saline and this was also aspirated. The catheter was secured to the skin with 0 silk suture and connected to JP bulb suction.  Post drain placement axial CT imaging demonstrates excellent drain positioning of a both catheters. The perigastric catheter is nearly completely resolved. There is some residual fluid anterior to the right hemi liver which should drain with the patient and in head up position. The patient tolerated the procedure well. There was no immediate complication. Samples of both fluid aspirates were sent for culture.  COMPLICATIONS: None  FINDINGS: As above  IMPRESSION: 1. Successful placement of a 12 [place] drain in the right upper quadrant/gallbladder fossa abscess collection with aspiration of 200 mL of purulent bilious fluid. A sample was sent for culture. The tube was left to JP bulb suction. 2. Successful placement of a 10 French drain in the mid epigastric/parepigastric abscess collection with aspiration of 70 mL L of thick, frankly purulent fluid. A sample was sent for culture. The tube was left to JP bulb suction. Signed,  Criselda Peaches, MD  [facility]  [facility]  Electronically Signed: By: Jacqulynn Cadet M.D. On: 08/15/2013 18:59   Ct Image Guided Drainage Percut Cath  Peritoneal Retroperit  08/15/2013   ADDENDUM REPORT: 08/15/2013 19:08  ADDENDUM: 3 mg Versed said and 100 mcg fentanyl were administered intravenously during a moderate sedation.   Electronically Signed   By: Jacqulynn Cadet M.D.   On: 08/15/2013 19:08   08/15/2013   CLINICAL DATA:  78 year old male with multifocal intra-abdominal abscesses status post laparoscopic cholecystectomy. He requires percutaneous drainage.  EXAM: CT GUIDED DRAINAGE OF gallbladder fossa/ perihepatic abscess ABSCESS; CT-guided drainage of perigastric abscess  ANESTHESIA/SEDATION: Versed said and fentanyl were administered  intravenously.  Total Moderate Sedation Time:  40 minutes  PROCEDURE: The procedure, risks, benefits, and alternatives were explained  to the patient. Questions regarding the procedure were encouraged and answered. The patient understands and consents to the procedure.  A planning axial CT scan was performed. Both the perigastric fluid collection in the mid epigastrium and the gallbladder fossa and perihepatic collection in the right upper quadrant were successfully identified. Suitable skin entry site was selected and marked.  The abdomen was prepped with Betadinein a sterile fashion, and a sterile drape was applied covering the operative field. A sterile gown and sterile gloves were used for the procedure. Local anesthesia was provided with 1% Lidocaine.  Attention was first turned to the right upper quadrant. Using intermittent CT fluoroscopic guidance, an 18 gauge trocar needle was carefully advanced over rib and superiorly over the colon and up into the gallbladder fossa fluid collection. An 035 wire was coiled in the tract serially dilated to 12 [place]. A Cook 12 [place] all-purpose drainage catheter was advanced over the wire and formed within the fluid collection. Approximately 200 mL of purulent, brown, bilious fluid was aspirated. The drain was connected to JP bulb suction and sutured in place with a 0 silk suture.  Attention was next turned to the abscess collection anterior to the gastric antrum in the mid epigastric region. Again, with intermittent CT fluoroscopic guidance a trocar needle was advanced into the fluid collection followed by a wire. The tract was dilated to 10 [place] and a [place] all-purpose drainage catheter was formed within the fluid collection. Approximately 70 mL of thick, frankly purulent fluid was aspirated. The cavity was lavaged with sterile normal saline and this was also aspirated. The catheter was secured to the skin with 0 silk suture and connected to JP bulb suction.   Post drain placement axial CT imaging demonstrates excellent drain positioning of a both catheters. The perigastric catheter is nearly completely resolved. There is some residual fluid anterior to the right hemi liver which should drain with the patient and in head up position. The patient tolerated the procedure well. There was no immediate complication. Samples of both fluid aspirates were sent for culture.  COMPLICATIONS: None  FINDINGS: As above  IMPRESSION: 1. Successful placement of a 12 French drain in the right upper quadrant/gallbladder fossa abscess collection with aspiration of 200 mL of purulent bilious fluid. A sample was sent for culture. The tube was left to JP bulb suction. 2. Successful placement of a 10 French drain in the mid epigastric/parepigastric abscess collection with aspiration of 70 mL L of thick, frankly purulent fluid. A sample was sent for culture. The tube was left to JP bulb suction. Signed,  Criselda Peaches, MD  [facility]  [facility]  Electronically Signed: By: Jacqulynn Cadet M.D. On: 08/15/2013 18:59   [place] Image Guided Drainage By Percutaneous Catheter  08/29/2013   CLINICAL DATA:  78 year old male with a history of intra-abdominal and hepatic abscesses status post laparoscopic cholecystectomy. The intra-abdominal abscess is have been treated by percutaneous drainage, however the hepatic abscess continues to enlarge despite IV antibiotic therapy. Percutaneous image guided drainage is indicated.  EXAM: [place] IMAGE GUIDED DRAINAGE BY PERCUTANEOUS CATHETE  Date: 08/29/2013  PROCEDURE: 1. Ultrasound-guided transhepatic drain placement Interventional Radiologist:  Criselda Peaches, MD  ANESTHESIA/SEDATION: Moderate (conscious) sedation was used. Two mg Versed, 75 mcg Fentanyl were administered intravenously. The patient's vital signs were monitored continuously by radiology nursing throughout the procedure.  Sedation Time: 25  minutes  TECHNIQUE: Informed consent was obtained from the patient following explanation of  the procedure, risks, benefits and alternatives. The patient understands, agrees and consents for the procedure. All questions were addressed. A time out was performed.  The right upper quadrant was interrogated with ultrasound. The intrahepatic complex fluid collection was successfully identified. A suitable skin entry site that would allow an avascular transhepatic parenchymal course was selected and marked. Local anesthesia was attained by infiltration with 1% lidocaine a small dermatotomy was made. Under real time sonographic guidance, an 18 gauge trocar needle was carefully advanced through the liver and into the abscess collection. Head Amplatz well a was should coiled within the abscess collection. The tract was dilated to 10 [place] and a [place] all-purpose drain advanced over the wire and positioned with the locking loop centrally in the abscess collection under ultrasound guidance. Images were obtained and stored for the medical record.  Approximately 60 cc of frankly purulent material was aspirated. A sample was sent for culture. The tube was secured to the skin with 0 Prolene suture and a sterile bandage. The tube was left to JP bulb suction. There was no immediate complication, the patient tolerated the procedure well.  IMPRESSION: Successful placement of a 10 French drain into the hepatic abscess. Approximately 60 mL of frankly purulent material was aspirated. A sample was sent for culture.  Maintain tube to gravity drainage. Recommend repeat ultrasound, or CT evaluation prior to tube removal.  Signed,  Criselda Peaches, MD  [facility]  [facility]   Electronically Signed   By: Jacqulynn Cadet M.D.   On: 08/29/2013 15:21    Microbiology: Recent Results (from the past 240 hour(s))  CULTURE, ROUTINE-ABSCESS     Status: None   Collection Time     08/29/13  3:01 PM      Result Value Ref Range Status   Specimen Description ABSCESS LIVER   Final   Special Requests NONE   Final   Gram Stain     Final   Value: ABUNDANT WBC PRESENT,BOTH PMN AND MONONUCLEAR     NO SQUAMOUS EPITHELIAL CELLS SEEN     NO ORGANISMS SEEN     Performed at [facility]   Culture     Final   Value: ABUNDANT MORGANELLA MORGANII     Performed at [facility]   Report Status 09/01/2013 FINAL   Final   Organism ID, Bacteria [facility] MORGANII   Final     Labs: Basic Metabolic Panel:  Recent Labs Lab 08/30/13 1215 08/31/13 0542 09/01/13 0445 09/03/13 0512  NA  --  133* 136* 136*  K  --  4.5 4.5 4.3  CL  --  97 101 99  CO2  --  27 26 25   GLUCOSE 498* 200* 144* 160*  BUN  --  25* 19 15  CREATININE  --  1.04 0.92 0.81  CALCIUM  --  8.3* 8.6 9.1   Liver Function Tests:  Recent Labs Lab 08/31/13 0542  AST 29  ALT 57*  ALKPHOS 213*  BILITOT 0.2*  PROT 6.5  ALBUMIN 1.7*   No results found for this basename: LIPASE, AMYLASE,  in the last 168 hours No results found for this basename: AMMONIA,  in the last 168 hours CBC:  Recent Labs Lab 08/31/13 0542 09/01/13 0445 09/03/13 0512  WBC 19.5* 10.9* 7.4  NEUTROABS 17.0*  --   --   HGB 7.0* 7.1* 8.3*  HCT 22.0* 21.6* 25.8*  MCV 81.8 81.8 81.9  PLT 397 308 361  Cardiac Enzymes: No results found for this basename: CKTOTAL, CKMB, CKMBINDEX, TROPONINI,  in the last 168 hours BNP: BNP (last 3 results) No results found for this basename: PROBNP,  in the last 8760 hours CBG:  Recent Labs Lab 09/05/13 0756 09/05/13 1131 09/05/13 1654 09/05/13 2150 09/06/13 0729  GLUCAP 197* 229* 290* 156* 141*   Signed:  Milaya Hora L  [facility] 09/06/2013, 8:27 AM

## 2013-09-06 NOTE — Progress Notes (Signed)
Called report to [facility]. Blanch Media, RN will be receiving patient. Last pain medication and insulin was noted. Told RN to call if any further questions.

## 2013-09-06 NOTE — Social Work (Signed)
Patient for d/c today to SNF bed at [facility].Family and patient agreeable to this plan- plan transfer via EMS. Eduard Clos, MSW, [place]

## 2013-09-08 ENCOUNTER — Other Ambulatory Visit: Payer: Self-pay | Admitting: *Deleted

## 2013-09-08 MED ORDER — HYDROCODONE-ACETAMINOPHEN 5-325 MG PO TABS: 1.0000 | ORAL_TABLET | Freq: Four times a day (QID) | ORAL | Status: DC | PRN

## 2013-09-08 NOTE — Telephone Encounter (Signed)
[facility] [place] 

## 2013-09-12 ENCOUNTER — Encounter: Payer: Self-pay | Admitting: Internal Medicine

## 2013-09-12 ENCOUNTER — Non-Acute Institutional Stay (SKILLED_NURSING_FACILITY): Payer: Medicare Other | Admitting: Internal Medicine

## 2013-09-12 DIAGNOSIS — R531 Weakness: Secondary | ICD-10-CM

## 2013-09-12 DIAGNOSIS — R339 Retention of urine, unspecified: Secondary | ICD-10-CM

## 2013-09-12 DIAGNOSIS — R5383 Other fatigue: Secondary | ICD-10-CM

## 2013-09-12 DIAGNOSIS — K750 Abscess of liver: Secondary | ICD-10-CM

## 2013-09-12 DIAGNOSIS — R5381 Other malaise: Secondary | ICD-10-CM

## 2013-09-12 DIAGNOSIS — M1990 Unspecified osteoarthritis, unspecified site: Secondary | ICD-10-CM

## 2013-09-12 DIAGNOSIS — K5900 Constipation, unspecified: Secondary | ICD-10-CM

## 2013-09-12 DIAGNOSIS — E440 Moderate protein-calorie malnutrition: Secondary | ICD-10-CM

## 2013-09-12 DIAGNOSIS — E119 Type 2 diabetes mellitus without complications: Secondary | ICD-10-CM

## 2013-09-12 DIAGNOSIS — M129 Arthropathy, unspecified: Secondary | ICD-10-CM

## 2013-09-12 DIAGNOSIS — IMO0002 Reserved for concepts with insufficient information to code with codable children: Secondary | ICD-10-CM

## 2013-09-12 NOTE — Progress Notes (Signed)
Patient ID: Mike Mack, male   DOB: [birth year], 78 y.o.   MRN: [id number]     [name] living [place]   PCP: [name], MD  Code Status: full code  No Known Allergies  Chief Complaint: new admission  HPI:  78 y/o male patient is here for STR after hospital admission from 08/14/13-09/06/13. He had cholesystectomy and was here for rehabilitation. He developed complications with hepatic abscess and was readmitted in the hospital. Surgery team was consulted.he was started on broad spectrum antibiotic coverage. IR guided abscess drainage was done and with culture growing bacteria, ID was consulted. The drains were removed after cholecystectomy. He is on antibiotics until seen by ID next. Given his weakness and need for rehabilitation. He was sent to SNF. He is seen in his room today. He is alert and oriented. He is in no distress. He denies any complaints.   Review of Systems:  Constitutional: Negative for fever, chills, weight loss, diaphoresis.  HENT: Negative for congestion, hearing loss and sore throat.   Eyes: Negative for eye pain, blurred vision, double vision and discharge.  Respiratory: Negative for cough, sputum production, shortness of breath and wheezing.   Cardiovascular: Negative for chest pain, palpitations, orthopnea and leg swelling.  Gastrointestinal: Negative for heartburn, nausea, vomiting, abdominal pain, diarrhea and constipation.  Genitourinary: Negative for dysuria and flank pain.  Musculoskeletal: Negative for back pain, falls, joint pain and myalgias.  Skin: Negative for itching and rash.  Neurological: Negative for weakness,dizziness, tingling, focal weakness and headaches.  Psychiatric/Behavioral: Negative for depression and memory loss. The patient is not nervous/anxious.     Past Medical History  Diagnosis Date  . Hypertension   . Small bowel obstruction   . BPH (benign prostatic hyperplasia)   . Neuropathy   . Polio   . Polio Childhood  .  Constipation 05/13/2013  . Anemia 05/13/2013  . Type II diabetes mellitus   . Exertional shortness of breath     "sometimes" (07/25/2013)  . History of stomach ulcers   . Stroke 2014    residual:  "left hand kind of numb" (07/25/2013)  . Arthritis     "right leg" (07/25/2013)   Past Surgical History  Procedure Laterality Date  . Bowel resection    . Cataract extraction w/ intraocular lens  implant, bilateral Bilateral 2000's  . Colonoscopy      Hx: of  . Laparoscopic cholecystectomy  07/25/2013    w/LOA (07/25/2013)  . Excisional hemorrhoidectomy  2000's  . Colon surgery    . Inguinal hernia repair Right 1970's  . Cholecystectomy N/A 07/25/2013    Procedure: LAPAROSCOPIC CHOLECYSTECTOMY WITH INTRAOPERATIVE CHOLANGIOGRAM;  Surgeon: [name], MD;  Location: [facility];  Service: General;  Laterality: N/A;   Social History:   reports that he has never smoked. He has never used smokeless tobacco. He reports that he does not drink alcohol or use illicit drugs.  Family History  Problem Relation Age of Onset  . Diabetes Sister   . Diabetes Sister   . Diabetes Brother   . Hypertension Sister   . Hypertension Sister   . Hypertension Brother   . Heart failure Mother   . Heart attack Sister     Medications: Patient's Medications  New Prescriptions   No medications on file  Previous Medications   FEEDING SUPPLEMENT, GLUCERNA SHAKE, (GLUCERNA SHAKE) LIQD    Take 237 mLs by mouth 3 (three) times daily between meals.   GABAPENTIN (NEURONTIN) 800 MG TABLET  Take 800 mg by mouth 3 (three) times daily as needed (HEADACHES).    HYDROCODONE-ACETAMINOPHEN (NORCO/VICODIN) 5-325 MG PER TABLET    Take 1 tablet by mouth every 6 (six) hours as needed for moderate pain.   INSULIN ASPART (NOVOLOG) 100 UNIT/ML INJECTION    Inject 5 Units into the skin 3 (three) times daily before meals.    INSULIN GLARGINE (LANTUS) 100 UNITS/ML SOLN    Inject 10 Units into the skin at bedtime.   MULTIPLE VITAMIN  (MULTIVITAMIN WITH MINERALS) TABS TABLET    Take 1 tablet by mouth daily.   POLYETHYLENE GLYCOL (MIRALAX / GLYCOLAX) PACKET    Take 17 g by mouth daily.   PREDNISONE (DELTASONE) 5 MG TABLET    Take 5 mg by mouth daily with breakfast.   SODIUM CHLORIDE 0.9 % SOLN 50 ML WITH AMPICILLIN 2 G SOLR 2 G    Inject 2 g into the vein every 6 (six) hours. Through 09/26/13   SULFAMETHOXAZOLE-TRIMETHOPRIM (BACTRIM DS) 800-160 MG PER TABLET    Take 2 tablets by mouth every 12 (twelve) hours. Through 09/17/13   TAMSULOSIN HCL (FLOMAX) 0.4 MG CAPS    Take 0.4 mg by mouth 2 (two) times daily.    TRAVOPROST, BAK FREE, (TRAVATAN) 0.004 % SOLN OPHTHALMIC SOLUTION    Place 1 drop into both eyes at bedtime.  Modified Medications   No medications on file  Discontinued Medications   ACETAMINOPHEN (TYLENOL) 325 MG TABLET    Take 2 tablets (650 mg total) by mouth every 4 (four) hours as needed for mild pain, fever or headache.   ONDANSETRON (ZOFRAN) 4 MG TABLET    Take 1 tablet (4 mg total) by mouth every 6 (six) hours as needed for nausea.     Physical Exam: Filed Vitals:   09/12/13 1038  BP: 128/88  Pulse: 76  Temp: 97.6 F (36.4 C)  Resp: 16  Height: 5\' 5"  (1.651 m)  Weight: 118 lb (53.524 kg)    General- elderly male in no acute distress, thin built, malnourished Head- atraumatic, normocephalic Eyes- PERRLA, EOMI, no pallor, no icterus, no discharge Neck- no lymphadenopathy Throat- moist mucus membrane, normal oropharynx Cardiovascular- normal s1,s2, no murmurs/ rubs/ gallops Respiratory- bilateral clear to auscultation, no wheeze, no rhonchi, no crackles, no use of accessory muscles Abdomen- bowel sounds present, soft, non tender, bandaid in place of drain site Musculoskeletal- able to move all 4 extremities, no leg edema, heel floaters, weakness present Neurological- no focal deficit Skin- warm and dry, picc line in left arm Psychiatry- alert and oriented to person, place and time, normal mood and  affect   Labs reviewed: Basic Metabolic Panel:  Recent Labs  05/13/13 0935  08/14/13 1345 08/14/13 2200  08/29/13 2025  08/31/13 0542 09/01/13 0445 09/03/13 0512  NA 132*  < > 136*  --   < > 134*  --  133* 136* 136*  K 4.2  < > 5.2  --   < > 4.2  --  4.5 4.5 4.3  CL 95*  < > 97  --   < > 98  --  97 101 99  CO2 26  < > 23  --   < > 25  --  27 26 25   GLUCOSE 62*  < > 290*  --   < > 128*  < > 200* 144* 160*  BUN 22  < > 31*  --   < > 15  --  25* 19 15  CREATININE  1.45*  < > 1.06  --   < > 1.10  --  1.04 0.92 0.81  CALCIUM 9.5  < > 8.6  --   < > 8.6  --  8.3* 8.6 9.1  MG 2.6*  --   --  2.1  --  1.7  --   --   --   --   PHOS  --   --   --  3.1  --   --   --   --   --   --   < > = values in this interval not displayed. Liver Function Tests:  Recent Labs  08/15/13 0233 08/21/13 0557 08/31/13 0542  AST 57* 29 29  ALT 43 36 57*  ALKPHOS 246* 186* 213*  BILITOT 0.4 0.2* 0.2*  PROT 6.6 6.1 6.5  ALBUMIN 1.8* 1.6* 1.7*    Recent Labs  05/13/13 0935 07/28/13 0915  LIPASE 9* 11   No results found for this basename: AMMONIA,  in the last 8760 hours CBC:  Recent Labs  08/24/13 0457  08/29/13 2025 08/31/13 0542 09/01/13 0445 09/03/13 0512  WBC 8.2  < > 22.2* 19.5* 10.9* 7.4  NEUTROABS 6.2  --  21.1* 17.0*  --   --   HGB 7.1*  < > 8.2* 7.0* 7.1* 8.3*  HCT 21.9*  < > 25.2* 22.0* 21.6* 25.8*  MCV 81.1  < > 81.6 81.8 81.8 81.9  PLT 450*  < > 529* 397 308 361  < > = values in this interval not displayed. Cardiac Enzymes:  Recent Labs  08/14/13 1345  TROPONINI <0.30   BNP: No components found with this basename: POCBNP,  CBG:  Recent Labs  09/05/13 2150 09/06/13 0729 09/06/13 1140  GLUCAP 156* 141* 175*    Radiological Exams:  Dg Chest 2 View  08/14/2013   CLINICAL DATA:  Fever, postoperative state  EXAM: CHEST  2 VIEW  COMPARISON:  DG CHEST 2 VIEW dated 07/28/2013  FINDINGS: There is a moderate-sized pleural effusion layering posteriorly on the right. A  discrete alveolar infiltrate on the right is not demonstrated. The left lung is clear. The cardiac silhouette is normal in size. The pulmonary vascularity is not clearly engorged. The trachea is midline. The observed portions of the bony thorax exhibit no acute abnormality.  IMPRESSION: There is a moderate sized right-sided pleural effusion layering posteriorly.   Electronically Signed   By: David  Martinique   On: 08/14/2013 14:18   Ct Abdomen W Contrast  09/04/2013   CLINICAL DATA:  Status post drainage of hepatic abscess.  EXAM: CT ABDOMEN WITH CONTRAST  TECHNIQUE: Multidetector CT imaging of the abdomen was performed using the standard protocol following bolus administration of intravenous contrast.  CONTRAST:  80 mL OMNIPAQUE IOHEXOL 300 MG/ML  SOLN  COMPARISON:  CT abdomen 08/28/2013.  FINDINGS: Moderate right pleural effusion is unchanged. The no left pleural effusion. Small amount of pericardial fluid is noted.  Pigtail catheter is in place and localizes previously seen fluid collection within the liver. The collection had measured 6.7 x 4.8 x 5.9 cm on the prior examination and today measures 4.0 x 1.5 x 1.3 cm. A more peripheral collection off the anterior margin of the right hepatic lobe which had measured 1.7 x 4.9 x 2.4 cm today measures 2.4 x 1.1 x 2.0 cm. Finally, subcapsular collection along the anterior margin of the liver containing air and fluid which had measured 4.2 x 0.9 cm in the axial plane today  measures 3.8 x 1.0 cm. No new fluid collection is identified.  The adrenal glands and pancreas are unremarkable. The spleen has been removed. Small bilateral renal cysts are unchanged. Punctate nonobstructing stone lower pole of the right kidney is again seen. Visualized bowel loops are unremarkable. Normal No focal bony abnormality is identified. IMPRESSION: Marked decrease in the size of a fluid collection in the right lobe of the liver as described with a drainage catheter in place.  Interval  decrease in size of the subcapsular air and fluid collection along the right hepatic lobe. A third fluid collection more inferiorly along the right hepatic lobe is also decreased in size. No new fluid collection is present.  No change in a moderate right pleural effusion. Small pericardial fusion is also noted.   Electronically Signed   By: Inge Rise M.D.   On: 09/04/2013 21:21   Ct Abdomen W Contrast  08/28/2013   CLINICAL DATA:  Liver abscess.  Evaluate for progression.  EXAM: CT ABDOMEN WITH CONTRAST  TECHNIQUE: Multidetector CT imaging of the abdomen was performed using the standard protocol following bolus administration of intravenous contrast.  CONTRAST:  2mL OMNIPAQUE IOHEXOL 300 MG/ML  SOLN  COMPARISON:  CT of the abdomen and pelvis 08/22/2013.  FINDINGS: Lung Bases: Incompletely visualized right pleural effusion which is moderate to large. Small left pleural effusion. Passive atelectasis in the visualized portions of the lung bases bilaterally. Tip of a central venous catheter is noted in the superior aspect of the right atrium. Trace amount of pericardial fluid and/or thickening, unchanged, and unlikely to be of hemodynamic significance at this time.  Abdomen: Complex rim enhancing fluid collection measuring approximately 6.7 x 4.8 x 5.9 cm in the inferior aspect of the right lobe of the liver appears slightly larger than the prior examination from 08/22/2013. There is again a more peripheral portion of the collection in the anterior aspect of the liver (predominantly segment 4B), where there was previously a drainage catheter which has since been removed. The smaller portion of the collection measures approximately 4.9 x 1.7 cm (image 22 of series 2) and contain several internal locular of gas. Immediately adjacent to this overlying the anterior surface of the liver adjacent to segment 4A there is a small gas and fluid containing collection which is favored to be subcapsular (rather than  pneumoperitoneum) which is essentially unchanged compared to the prior study, currently measuring 4.2 x 0.9 cm. No new hepatic collections are noted. Status post cholecystectomy. No intrahepatic biliary ductal dilatation. Other previously noted drainage catheter in the epigastric region has been removed.  The appearance of the pancreas, spleen and bilateral adrenal glands is unremarkable. 3 mm nonobstructive calculus in the lower pole collecting system of the right kidney. Multiple low-attenuation lesions in the kidneys bilaterally, many of which are too small to definitively characterize. The largest of these lesions are all compatible with simple cysts, with the largest of these measuring 2.3 cm in diameter in the lower pole of the left kidney. Within the visualized portions of the peritoneal cavity there is no significant volume of ascites and no definite pneumoperitoneum. No pathologic distention of small bowel. No lymphadenopathy.  Musculoskeletal: There are no aggressive appearing lytic or blastic lesions noted in the visualized portions of the skeleton.  IMPRESSION: 1. Interval enlargement of a complex multilocular rim enhancing fluid collection in the inferior aspect of the right lobe of the liver, compatible with progressive enlargement of an intrahepatic abscess. 2. Previously noted peripheral abscess in  segment 4B at site of prior drain placement is again noted, currently measuring 1.7 x 4.9 cm with several tiny locules of internal gas. There is also a small gas and fluid collection which appears to be subcapsular overlying the mass which is essentially unchanged compared to the prior study. Previously noted drainage catheters have both been removed. 3. Moderate to large right pleural effusion and small left pleural effusion are both unchanged, with extensive passive atelectasis in the lower lobes of the lungs bilaterally (right greater than left). 4. Trace amount of pericardial fluid and/or thickening,  unlikely to be of hemodynamic significance at this time. This finding is unchanged. 5. Additional incidental findings, as above.   Electronically Signed   By: Vinnie Langton M.D.   On: 08/28/2013 13:07   Nm Hepatobiliary Liver Func  08/15/2013   CLINICAL DATA:  Abdominal pain, post cholecystectomy.  EXAM: NUCLEAR MEDICINE HEPATOBILIARY IMAGING  TECHNIQUE: Sequential images of the abdomen were obtained out to 60 minutes following intravenous administration of radiopharmaceutical.  COMPARISON:  Abdominal CT 08/14/2013  RADIOPHARMACEUTICALS:  5 mCi Tc-67m Choletec  FINDINGS: The radiopharmaceutical was taken up by the liver and excreted into the biliary system. There are areas of photopenia involving the central right hepatic lobe related to the known fluid or abscess collections. There is no evidence for a bile leak. Activity is identified in the small bowel.  IMPRESSION: No evidence for a bile leak.  Photopenia areas in the liver are most compatible with known fluid or abscess collections.   Electronically Signed   By: Markus Daft M.D.   On: 08/15/2013 09:34   Ct Abdomen Pelvis W Contrast  08/22/2013   CLINICAL DATA:  Prior percutaneous abscess drainage of right upper quadrant biloma/abscess. Now for follow-up.  EXAM: CT ABDOMEN AND PELVIS WITH CONTRAST  TECHNIQUE: Multidetector CT imaging of the abdomen and pelvis was performed using the standard protocol following bolus administration of intravenous contrast.  CONTRAST:  124mL OMNIPAQUE IOHEXOL 300 MG/ML SOLN. There was extravasation of contrast during injection. Approximately 80-90 cc of contrast was extravasated into the patient's right upper arm. Upon evaluation, the patient was without complaint. Patient has pulses in the right upper extremity were strong and normal. Pulses, sensory and motor function were normal and symmetric to the opposite side. The patient will be followed by the radiology PA. Orders were placed in the patient's chart.  COMPARISON:   CT ABD/PELVIS W CM dated 08/14/2013  FINDINGS: Stable moderate to large right pleural effusion. New small left pleural effusion. Compressive atelectasis in the lower lobes bilaterally.  Patient is status post placement of 2 upper abdominal abscess drainage catheters, 1 in the postsurgical bed adjacent to the liver and the second in the midline anteriorly within the upper abdomen. The fluid collections in these areas have nearly completely resolved. A small residual fluid and gas collection anterior to the liver measures approximately 4.4 x 0.9 cm. This is smaller than prior study and presumably communicates with the previously seen large fluid collection in the gallbladder fossa. No measurable fluid collection noted adjacent to the midline drainage catheter in the subxiphoid region.  There is a complex appearing low-density area within the right hepatic lobe posterior to the gallbladder fossa. This currently measures up to 6 cm. This previously measured up to 3.1 cm. With the contrast extravasation that occurred during the study, this is essentially a noncontrast study making it difficult to evaluate this lesion, but this has enlarged since prior study and appears complex. Cannot  exclude hepatic abscess.  Appendix is visualized and is normal. Stomach, large and small bowel grossly unremarkable. Prostate is enlarged. Foley catheter is in place within the bladder which is otherwise grossly unremarkable.  Spleen, pancreas, adrenals have an unremarkable unenhanced appearance. Small low-density lesions in the kidneys bilaterally were shown on prior contrast study to represent cysts. No hydronephrosis. No free fluid or adenopathy.  No acute bony abnormality.  IMPRESSION: Near complete resolution of the large fluid collection in the gallbladder fossa with drainage catheter in place. Only a small amount of fluid and gas remain present anterior to the liver edge.  No measurable fluid remains in the subxiphoid region adjacent  to the midline drainage catheter.  Enlarging low-density area in the right hepatic lobe. Cannot exclude enlarging hepatic abscess.  Moderate to large right pleural effusion and small left pleural effusion. Compressive atelectasis in the lower lobes.   Electronically Signed   By: Rolm Baptise M.D.   On: 08/22/2013 15:32   Ct Abdomen Pelvis W Contrast  08/14/2013   CLINICAL DATA:  Fever, abdominal pain, status post lap cholecystectomy 1 week ago  EXAM: CT ABDOMEN AND PELVIS WITH CONTRAST  TECHNIQUE: Multidetector CT imaging of the abdomen and pelvis was performed using the standard protocol following bolus administration of intravenous contrast.  CONTRAST:  141mL OMNIPAQUE IOHEXOL 300 MG/ML  SOLN  COMPARISON:  CT abdomen pelvis dated 05/13/2013  FINDINGS: Moderate right and trace left pleural effusions. Associated lower lobe atelectasis.  Status post cholecystectomy. Complex fluid collection/abscess arising from the surgical bed and extending along the medial segment left hepatic lobe to the anterior hepatic dome (series 2/image 11). Collection is multiloculated and difficult to discretely measure, but is approximately 7.4 x 6.5 x 9.4 cm (series 2/ image 22; coronal image 32). Foci of gas within the collection, some of which is nondependent. This appearance is compatible with a postoperative abscess or possibly an infected biloma.  Adjacent 3.1 x 2.5 x 3.6 cm fluid collection/abscess in the posterior segment right hepatic lobe (series 2/image 26). Additional bilobed fluid collection inferior to the left hepatic lobe and beneath the anterior abdominal wall, measuring approximately 3.0 x 3.5 x 3.2 cm along its right aspect (series 2/ image 23) and 4.3 x 4.8 x 6.8 cm along its left aspect (series 2/image 29; coronal image 15).  Liver is otherwise unremarkable.  Spleen, pancreas, and adrenal glands are within normal limits.  Bilateral renal cysts, measuring up to 2.1 cm in the posterior right upper pole (series 2/  image 30) and 2.2 cm in the medial left lower pole (series 2/image 38). No hydronephrosis.  No evidence of bowel obstruction.  Normal appendix.  No evidence of abdominal aortic aneurysm.  No abdominopelvic ascites.  No suspicious abdominopelvic lymphadenopathy.  Prostate is notable for enlargement of the central gland which indents the base of the bladder.  Dystrophic calcifications just posterior to the right bladder (series 2/image 80). Bladder is otherwise unremarkable  IMPRESSION: Status post cholecystectomy.  9.4 cm multiloculated abscess/infected biloma arising from the surgical bed, extending along the medial segment left hepatic lobe, and overlying the left hepatic dome.  Adjacent 3.6 cm abscess in the posterior segment right hepatic lobe.  Adjacent bilobed abscess extending from the left hepatic lobe to beneath the anterior abdominal wall, measuring up to 6.5 cm in maximal dimension.  Foci of gas within the dominant collection.  No free air.  These results will be called to the ordering clinician or representative by the Radiologist Assistant,  and communication documented in the PACS Dashboard.   Electronically Signed   By: Julian Hy M.D.   On: 08/14/2013 17:08   Dg Chest Port 1 View  08/29/2013   CLINICAL DATA:  Cough  EXAM: PORTABLE CHEST - 1 VIEW  COMPARISON:  08/14/2013  FINDINGS: Cardiac shadow is stable. A left-sided PICC line is noted. It overlies the midline due to patient rotation to the left. It lies in the superior aspect of the right atrium. Small bilateral pleural effusions are noted. Mild bibasilar atelectasis is noted.  IMPRESSION: Bibasilar changes as described.  Left PICC line with the catheter tip in the proximal right atrium. This could be withdrawn 1-2 cm as necessary.   Electronically Signed   By: Inez Catalina M.D.   On: 08/29/2013 19:29   Ct Image Guided Drainage Percut Cath  Peritoneal Retroperit  08/15/2013   ADDENDUM REPORT: 08/15/2013 19:08  ADDENDUM: 3 mg Versed said  and 100 mcg fentanyl were administered intravenously during a moderate sedation.   Electronically Signed   By: Jacqulynn Cadet M.D.   On: 08/15/2013 19:08   08/15/2013   CLINICAL DATA:  78 year old male with multifocal intra-abdominal abscesses status post laparoscopic cholecystectomy. He requires percutaneous drainage.  EXAM: CT GUIDED DRAINAGE OF gallbladder fossa/ perihepatic abscess ABSCESS; CT-guided drainage of perigastric abscess  ANESTHESIA/SEDATION: Versed said and fentanyl were administered intravenously.  Total Moderate Sedation Time:  40 minutes  PROCEDURE: The procedure, risks, benefits, and alternatives were explained to the patient. Questions regarding the procedure were encouraged and answered. The patient understands and consents to the procedure.  A planning axial CT scan was performed. Both the perigastric fluid collection in the mid epigastrium and the gallbladder fossa and perihepatic collection in the right upper quadrant were successfully identified. Suitable skin entry site was selected and marked.  The abdomen was prepped with Betadinein a sterile fashion, and a sterile drape was applied covering the operative field. A sterile gown and sterile gloves were used for the procedure. Local anesthesia was provided with 1% Lidocaine.  Attention was first turned to the right upper quadrant. Using intermittent CT fluoroscopic guidance, an 18 gauge trocar needle was carefully advanced over rib and superiorly over the colon and up into the gallbladder fossa fluid collection. An 035 wire was coiled in the tract serially dilated to 12 [place]. A Cook 12 [place] all-purpose drainage catheter was advanced over the wire and formed within the fluid collection. Approximately 200 mL of purulent, brown, bilious fluid was aspirated. The drain was connected to JP bulb suction and sutured in place with a 0 silk suture.  Attention was next turned to the abscess collection anterior to the gastric antrum in the mid  epigastric region. Again, with intermittent CT fluoroscopic guidance a trocar needle was advanced into the fluid collection followed by a wire. The tract was dilated to 10 [place] and a [place] all-purpose drainage catheter was formed within the fluid collection. Approximately 70 mL of thick, frankly purulent fluid was aspirated. The cavity was lavaged with sterile normal saline and this was also aspirated. The catheter was secured to the skin with 0 silk suture and connected to JP bulb suction.  Post drain placement axial CT imaging demonstrates excellent drain positioning of a both catheters. The perigastric catheter is nearly completely resolved. There is some residual fluid anterior to the right hemi liver which should drain with the patient and in head up position. The patient tolerated the procedure well. There was no  immediate complication. Samples of both fluid aspirates were sent for culture.  COMPLICATIONS: None  FINDINGS: As above  IMPRESSION: 1. Successful placement of a 12 French drain in the right upper quadrant/gallbladder fossa abscess collection with aspiration of 200 mL of purulent bilious fluid. A sample was sent for culture. The tube was left to JP bulb suction. 2. Successful placement of a 10 French drain in the mid epigastric/parepigastric abscess collection with aspiration of 70 mL L of thick, frankly purulent fluid. A sample was sent for culture. The tube was left to JP bulb suction. Signed,  Criselda Peaches, MD  [facility]  [facility]  Electronically Signed: By: Jacqulynn Cadet M.D. On: 08/15/2013 18:59   Ct Image Guided Drainage Percut Cath  Peritoneal Retroperit  08/15/2013   ADDENDUM REPORT: 08/15/2013 19:08  ADDENDUM: 3 mg Versed said and 100 mcg fentanyl were administered intravenously during a moderate sedation.   Electronically Signed   By: Jacqulynn Cadet M.D.   On: 08/15/2013 19:08   08/15/2013   CLINICAL DATA:   78 year old male with multifocal intra-abdominal abscesses status post laparoscopic cholecystectomy. He requires percutaneous drainage.  EXAM: CT GUIDED DRAINAGE OF gallbladder fossa/ perihepatic abscess ABSCESS; CT-guided drainage of perigastric abscess  ANESTHESIA/SEDATION: Versed said and fentanyl were administered intravenously.  Total Moderate Sedation Time:  40 minutes  PROCEDURE: The procedure, risks, benefits, and alternatives were explained to the patient. Questions regarding the procedure were encouraged and answered. The patient understands and consents to the procedure.  A planning axial CT scan was performed. Both the perigastric fluid collection in the mid epigastrium and the gallbladder fossa and perihepatic collection in the right upper quadrant were successfully identified. Suitable skin entry site was selected and marked.  The abdomen was prepped with Betadinein a sterile fashion, and a sterile drape was applied covering the operative field. A sterile gown and sterile gloves were used for the procedure. Local anesthesia was provided with 1% Lidocaine.  Attention was first turned to the right upper quadrant. Using intermittent CT fluoroscopic guidance, an 18 gauge trocar needle was carefully advanced over rib and superiorly over the colon and up into the gallbladder fossa fluid collection. An 035 wire was coiled in the tract serially dilated to 12 [place]. A Cook 12 [place] all-purpose drainage catheter was advanced over the wire and formed within the fluid collection. Approximately 200 mL of purulent, brown, bilious fluid was aspirated. The drain was connected to JP bulb suction and sutured in place with a 0 silk suture.  Attention was next turned to the abscess collection anterior to the gastric antrum in the mid epigastric region. Again, with intermittent CT fluoroscopic guidance a trocar needle was advanced into the fluid collection followed by a wire. The tract was dilated to 10 [place] and a [place] all-purpose drainage catheter was formed within the fluid collection. Approximately 70 mL of thick, frankly purulent fluid was aspirated. The cavity was lavaged with sterile normal saline and this was also aspirated. The catheter was secured to the skin with 0 silk suture and connected to JP bulb suction.  Post drain placement axial CT imaging demonstrates excellent drain positioning of a both catheters. The perigastric catheter is nearly completely resolved. There is some residual fluid anterior to the right hemi liver which should drain with the patient and in head up position. The patient tolerated the procedure well. There was no immediate complication. Samples of both fluid aspirates were sent for culture.  COMPLICATIONS: None  FINDINGS: As above  IMPRESSION: 1. Successful placement of a 12 French drain in the right upper quadrant/gallbladder fossa abscess collection with aspiration of 200 mL of purulent bilious fluid. A sample was sent for culture. The tube was left to JP bulb suction. 2. Successful placement of a 10 French drain in the mid epigastric/parepigastric abscess collection with aspiration of 70 mL L of thick, frankly purulent fluid. A sample was sent for culture. The tube was left to JP bulb suction. Signed,  Criselda Peaches, MD  [facility]  [facility]  Electronically Signed: By: Jacqulynn Cadet M.D. On: 08/15/2013 18:59   [place] Image Guided Drainage By Percutaneous Catheter  08/29/2013   CLINICAL DATA:  78 year old male with a history of intra-abdominal and hepatic abscesses status post laparoscopic cholecystectomy. The intra-abdominal abscess is have been treated by percutaneous drainage, however the hepatic abscess continues to enlarge despite IV antibiotic therapy. Percutaneous image guided drainage is indicated.  EXAM: [place] IMAGE GUIDED DRAINAGE BY PERCUTANEOUS CATHETE  Date: 08/29/2013  PROCEDURE: 1. Ultrasound-guided transhepatic  drain placement Interventional Radiologist:  Criselda Peaches, MD  ANESTHESIA/SEDATION: Moderate (conscious) sedation was used. Two mg Versed, 75 mcg Fentanyl were administered intravenously. The patient's vital signs were monitored continuously by radiology nursing throughout the procedure.  Sedation Time: 25 minutes  TECHNIQUE: Informed consent was obtained from the patient following explanation of the procedure, risks, benefits and alternatives. The patient understands, agrees and consents for the procedure. All questions were addressed. A time out was performed.  The right upper quadrant was interrogated with ultrasound. The intrahepatic complex fluid collection was successfully identified. A suitable skin entry site that would allow an avascular transhepatic parenchymal course was selected and marked. Local anesthesia was attained by infiltration with 1% lidocaine a small dermatotomy was made. Under real time sonographic guidance, an 18 gauge trocar needle was carefully advanced through the liver and into the abscess collection. Head Amplatz well a was should coiled within the abscess collection. The tract was dilated to 10 [place] and a [place] all-purpose drain advanced over the wire and positioned with the locking loop centrally in the abscess collection under ultrasound guidance. Images were obtained and stored for the medical record.  Approximately 60 cc of frankly purulent material was aspirated. A sample was sent for culture. The tube was secured to the skin with 0 Prolene suture and a sterile bandage. The tube was left to JP bulb suction. There was no immediate complication, the patient tolerated the procedure well.  IMPRESSION: Successful placement of a 10 French drain into the hepatic abscess. Approximately 60 mL of frankly purulent material was aspirated. A sample was sent for culture.  Maintain tube to gravity drainage. Recommend repeat ultrasound, or CT evaluation prior to tube removal.   Signed,  Criselda Peaches, MD  [facility]  [facility]   Electronically Signed   By: Jacqulynn Cadet M.D.   On: 08/29/2013 15:21    Assessment/Plan  Generalized weakness- prolonged and multiple hospital admission, liver abscess and his co-morbidities have made him weak. Encourage po intake and have him work with PT and oT for strengthening exercises. Fall precautions. Encourage OOB to chair  Hepatic abscess- with enterococcus growing. Continue bactrim ds 2 tab bid until 09/17/13 and ampicillin until 09/26/13. Has follow up with ID. Continue PICC line care. Monitor wbc and temp curve  Malnutrition- encourage po intake. Continue ensure supplement. Skin care. Continue multivitamin. Monitor  for oral thrush. Maintain oral hygiene  Dm type 2- continue lantus with SSI novolog, monitor cbg  Constipation- continue miralax for now  Urinary retention- stable. Continue flomax 0.4 mg daily  Arthritis- Continue hydrocodne-apap prn for pain  Chronic steroid use- continue prednisone 5 mg daily. Monitor clinically   Family/ staff Communication: reviewed care plan with patient and nursing supervisor   Goals of care: Short term rehab   Labs/tests ordered- cbc, cmp    Blanchie Serve, MD  [facility] [phone] (Monday-Friday 8 am - 5 pm) [phone] (afterhours)

## 2013-10-02 ENCOUNTER — Encounter: Payer: Self-pay | Admitting: Internal Medicine

## 2013-10-02 ENCOUNTER — Ambulatory Visit (INDEPENDENT_AMBULATORY_CARE_PROVIDER_SITE_OTHER): Payer: Medicare Other | Admitting: Internal Medicine

## 2013-10-02 VITALS — BP 154/78 | HR 97 | Temp 98.4°F | Ht 65.0 in | Wt 120.0 lb

## 2013-10-02 DIAGNOSIS — K651 Peritoneal abscess: Secondary | ICD-10-CM

## 2013-10-02 NOTE — Progress Notes (Signed)
   Subjective:    Patient ID: Mike Mack, male    DOB: Apr 28, 1936, 78 y.o.   MRN: [id number]  HPI Here for hospital follow up of his liver abscess.  Had lap chole 07/25/13, developed fever, chills and found multiloculated abscess.  Started on empiric antibiotics and cultures then grew ampicillin sensitive Enterococcus and continued on ampicillin.  Drains were removed but patient developed further symptoms and found new abscess development.  Grew Morganelli.  Patient continued on ampicillin and Rocephin and drain was removed prior to discharge with resolution of abscess.  He continued on ampicillin IV and po Bactrim for 3 weeks after removal of drain and has been off antibiotics one week.  No further chils, no fever, no abdominal pain.     Review of Systems  Constitutional: Negative for fever, chills and fatigue.  Gastrointestinal: Negative for nausea, abdominal pain, diarrhea and abdominal distention.  Musculoskeletal:       Ulcer on his right foot  Skin: Negative for rash.       Objective:   Physical Exam  Constitutional: He appears well-developed and well-nourished. No distress.  In wheelchair  Eyes: No scleral icterus.  Cardiovascular: Normal rate, regular rhythm and normal heart sounds.   No murmur heard. Musculoskeletal:  Right leg thin, 1 cm ulcer with good granulation tissue, no necrosis, no drainage  Skin: No rash noted.          Assessment & Plan:

## 2013-10-02 NOTE — Assessment & Plan Note (Addendum)
Resolved.  REmains asymptomatic off of antibiotics.  Will pull picc and stop.  He can return PRN.  picc line identified and pulled by me.

## 2013-10-08 ENCOUNTER — Encounter: Payer: Self-pay | Admitting: *Deleted

## 2013-10-08 NOTE — Telephone Encounter (Signed)
Error

## 2013-10-24 ENCOUNTER — Encounter: Payer: Self-pay | Admitting: Internal Medicine

## 2013-10-24 ENCOUNTER — Non-Acute Institutional Stay (SKILLED_NURSING_FACILITY): Payer: Medicare Other | Admitting: Internal Medicine

## 2013-10-24 DIAGNOSIS — K5900 Constipation, unspecified: Secondary | ICD-10-CM

## 2013-10-24 DIAGNOSIS — M1990 Unspecified osteoarthritis, unspecified site: Secondary | ICD-10-CM

## 2013-10-24 DIAGNOSIS — R51 Headache: Secondary | ICD-10-CM

## 2013-10-24 DIAGNOSIS — E441 Mild protein-calorie malnutrition: Secondary | ICD-10-CM

## 2013-10-24 DIAGNOSIS — M129 Arthropathy, unspecified: Secondary | ICD-10-CM

## 2013-10-24 DIAGNOSIS — E119 Type 2 diabetes mellitus without complications: Secondary | ICD-10-CM

## 2013-10-24 DIAGNOSIS — IMO0002 Reserved for concepts with insufficient information to code with codable children: Secondary | ICD-10-CM

## 2013-10-24 NOTE — Progress Notes (Signed)
Patient ID: Mike Mack, male   DOB: 04/09/1936, 78 y.o.   MRN: [id number]    [name] living [facility]  Chief Complaint  Patient presents with  . Discharge Note    discharge home   No Known Allergies  HPI:   78 y/o male patient has been here for rehabilitation and now is stable to be discharged home. He is s/p cholecystectomy and liver abscess drainage. He has worked well with therapy team. He has completed his antibiotic course. He denies any complaints today  Review of Systems:  Constitutional: Negative for fever, chills, weight loss, diaphoresis.  HENT: Negative for congestion, hearing loss and sore throat.   Eyes: Negative for eye pain, blurred vision, double vision and discharge.  Respiratory: Negative for cough, sputum production, shortness of breath and wheezing.   Cardiovascular: Negative for chest pain, palpitations, orthopnea and leg swelling.  Gastrointestinal: Negative for heartburn, nausea, vomiting, abdominal pain, diarrhea and constipation.  Genitourinary: Negative for dysuria and flank pain.  Musculoskeletal: Negative for back pain, falls, joint pain and myalgias.  Skin: Negative for itching and rash.  Neurological: Negative for weakness,dizziness, tingling, focal weakness and headaches.  Psychiatric/Behavioral: Negative for depression and memory loss. The patient is not nervous/anxious.    Past Medical History  Diagnosis Date  . Hypertension   . Small bowel obstruction   . BPH (benign prostatic hyperplasia)   . Neuropathy   . Polio   . Polio Childhood  . Constipation 05/13/2013  . Anemia 05/13/2013  . Type II diabetes mellitus   . Exertional shortness of breath     "sometimes" (07/25/2013)  . History of stomach ulcers   . Stroke 2014    residual:  "left hand kind of numb" (07/25/2013)  . Arthritis     "right leg" (07/25/2013)   Current Outpatient Prescriptions on File Prior to Visit  Medication Sig Dispense Refill  . gabapentin (NEURONTIN) 800 MG tablet  Take 800 mg by mouth 3 (three) times daily as needed (HEADACHES).       Marland Kitchen HYDROcodone-acetaminophen (NORCO/VICODIN) 5-325 MG per tablet Take 1 tablet by mouth every 6 (six) hours as needed for moderate pain.  120 tablet  0  . insulin aspart (NOVOLOG) 100 UNIT/ML injection Inject 5 Units into the skin 3 (three) times daily before meals.       . insulin glargine (LANTUS) 100 units/mL SOLN Inject 10 Units into the skin at bedtime.      . Multiple Vitamin (MULTIVITAMIN WITH MINERALS) TABS tablet Take 1 tablet by mouth daily.      . polyethylene glycol (MIRALAX / GLYCOLAX) packet Take 17 g by mouth daily.  14 each  0  . predniSONE (DELTASONE) 5 MG tablet Take 5 mg by mouth daily with breakfast.      . Tamsulosin HCl (FLOMAX) 0.4 MG CAPS Take 0.4 mg by mouth 2 (two) times daily.       . Travoprost, BAK Free, (TRAVATAN) 0.004 % SOLN ophthalmic solution Place 1 drop into both eyes at bedtime.       No current facility-administered medications on file prior to visit.   Physical exam BP 146/80  Pulse 80  Temp(Src) 98.2 F (36.8 C)  Resp 18  Ht 5\' 5"  (1.651 m)  Wt 123 lb (55.792 kg)  BMI 20.47 kg/m2  General- elderly male in no acute distress, thin built Head- atraumatic, normocephalic Eyes- PERRLA, EOMI, no pallor, no icterus, no discharge Neck- no lymphadenopathy Throat- moist mucus membrane, normal oropharynx Cardiovascular- normal s1,s2,  no murmurs/ rubs/ gallops Respiratory- bilateral clear to auscultation, no wheeze, no rhonchi, no crackles, no use of accessory muscles Abdomen- bowel sounds present, soft, non tender Musculoskeletal- able to move all 4 extremities, no leg edema Neurological- no focal deficit Skin- warm and dry Psychiatry- alert and oriented to person, place and time, normal mood and affect  Assessment/plan  Patient is stable to be discharged home with home health services for PT and OT. Script on medication will be provided. Needs to have follow up with his  PCP  Malnutrition- Continue nutritional supplement. Skin care. Continue multivitamin.  Dm type 2- continue lantus with SSI novolog, monitor cbg, script for diabetic supplies provided, patient mentions that he knows and was self injecting insulin prior to admission.   Constipation- continue miralax for now  Urinary retention- stable. Continue flomax 0.4 mg daily  Arthritis- Continue hydrocodne-apap prn for pain  Chronic steroid use- continue prednisone 5 mg daily. Monitor clinically  Headache- stable. Continue gabapentin

## 2013-11-04 ENCOUNTER — Emergency Department (HOSPITAL_COMMUNITY): Payer: Medicare Other

## 2013-11-04 ENCOUNTER — Encounter (HOSPITAL_COMMUNITY): Payer: Self-pay | Admitting: Emergency Medicine

## 2013-11-04 ENCOUNTER — Emergency Department (HOSPITAL_COMMUNITY)
Admission: EM | Admit: 2013-11-04 | Discharge: 2013-11-04 | Disposition: A | Discharge status: Home / Self Care | Payer: Medicare Other | Attending: Emergency Medicine | Admitting: Emergency Medicine

## 2013-11-04 DIAGNOSIS — Z8673 Personal history of transient ischemic attack (TIA), and cerebral infarction without residual deficits: Secondary | ICD-10-CM | POA: Insufficient documentation

## 2013-11-04 DIAGNOSIS — N400 Enlarged prostate without lower urinary tract symptoms: Secondary | ICD-10-CM | POA: Insufficient documentation

## 2013-11-04 DIAGNOSIS — Z794 Long term (current) use of insulin: Secondary | ICD-10-CM | POA: Insufficient documentation

## 2013-11-04 DIAGNOSIS — K5900 Constipation, unspecified: Secondary | ICD-10-CM | POA: Insufficient documentation

## 2013-11-04 DIAGNOSIS — M1710 Unilateral primary osteoarthritis, unspecified knee: Secondary | ICD-10-CM | POA: Insufficient documentation

## 2013-11-04 DIAGNOSIS — Z9889 Other specified postprocedural states: Secondary | ICD-10-CM | POA: Insufficient documentation

## 2013-11-04 DIAGNOSIS — Z9089 Acquired absence of other organs: Secondary | ICD-10-CM | POA: Insufficient documentation

## 2013-11-04 DIAGNOSIS — Z8612 Personal history of poliomyelitis: Secondary | ICD-10-CM | POA: Insufficient documentation

## 2013-11-04 DIAGNOSIS — E119 Type 2 diabetes mellitus without complications: Secondary | ICD-10-CM | POA: Insufficient documentation

## 2013-11-04 DIAGNOSIS — R109 Unspecified abdominal pain: Secondary | ICD-10-CM

## 2013-11-04 DIAGNOSIS — Z8669 Personal history of other diseases of the nervous system and sense organs: Secondary | ICD-10-CM | POA: Insufficient documentation

## 2013-11-04 DIAGNOSIS — Z79899 Other long term (current) drug therapy: Secondary | ICD-10-CM | POA: Insufficient documentation

## 2013-11-04 DIAGNOSIS — Z862 Personal history of diseases of the blood and blood-forming organs and certain disorders involving the immune mechanism: Secondary | ICD-10-CM | POA: Insufficient documentation

## 2013-11-04 DIAGNOSIS — IMO0002 Reserved for concepts with insufficient information to code with codable children: Secondary | ICD-10-CM | POA: Insufficient documentation

## 2013-11-04 LAB — CBC WITH DIFFERENTIAL/PLATELET
Basophils Absolute: 0 10*3/uL (ref 0.0–0.1)
Basophils Relative: 0 % (ref 0–1)
EOS ABS: 0 10*3/uL (ref 0.0–0.7)
EOS PCT: 1 % (ref 0–5)
HCT: 37.3 % — ABNORMAL LOW (ref 39.0–52.0)
HEMOGLOBIN: 12.2 g/dL — AB (ref 13.0–17.0)
Lymphocytes Relative: 30 % (ref 12–46)
Lymphs Abs: 1.8 10*3/uL (ref 0.7–4.0)
MCH: 28 pg (ref 26.0–34.0)
MCHC: 32.7 g/dL (ref 30.0–36.0)
MCV: 85.6 fL (ref 78.0–100.0)
MONOS PCT: 8 % (ref 3–12)
Monocytes Absolute: 0.5 10*3/uL (ref 0.1–1.0)
Neutro Abs: 3.7 10*3/uL (ref 1.7–7.7)
Neutrophils Relative %: 61 % (ref 43–77)
Platelets: 285 10*3/uL (ref 150–400)
RBC: 4.36 MIL/uL (ref 4.22–5.81)
RDW: 14.9 % (ref 11.5–15.5)
WBC: 6 10*3/uL (ref 4.0–10.5)

## 2013-11-04 LAB — COMPREHENSIVE METABOLIC PANEL
ALT: 19 U/L (ref 0–53)
AST: 17 U/L (ref 0–37)
Albumin: 3.3 g/dL — ABNORMAL LOW (ref 3.5–5.2)
Alkaline Phosphatase: 128 U/L — ABNORMAL HIGH (ref 39–117)
BUN: 18 mg/dL (ref 6–23)
CALCIUM: 9.8 mg/dL (ref 8.4–10.5)
CO2: 25 mEq/L (ref 19–32)
Chloride: 97 mEq/L (ref 96–112)
Creatinine, Ser: 0.98 mg/dL (ref 0.50–1.35)
GFR calc non Af Amer: 78 mL/min — ABNORMAL LOW (ref >90)
Glucose, Bld: 100 mg/dL — ABNORMAL HIGH (ref 70–99)
Potassium: 4.4 mEq/L (ref 3.7–5.3)
Sodium: 135 mEq/L — ABNORMAL LOW (ref 137–147)
TOTAL PROTEIN: 7.2 g/dL (ref 6.0–8.3)
Total Bilirubin: 0.3 mg/dL (ref 0.3–1.2)

## 2013-11-04 LAB — URINE MICROSCOPIC-ADD ON

## 2013-11-04 LAB — URINALYSIS, ROUTINE W REFLEX MICROSCOPIC
Bilirubin Urine: NEGATIVE
Glucose, UA: >1000 mg/dL — AB
Hgb urine dipstick: NEGATIVE
Ketones, ur: NEGATIVE mg/dL
LEUKOCYTES UA: NEGATIVE
Nitrite: NEGATIVE
PH: 7.5 (ref 5.0–8.0)
Protein, ur: NEGATIVE mg/dL
SPECIFIC GRAVITY, URINE: 1.023 (ref 1.005–1.030)
Urobilinogen, UA: 0.2 mg/dL (ref 0.0–1.0)

## 2013-11-04 LAB — LIPASE, BLOOD: LIPASE: 24 U/L (ref 11–59)

## 2013-11-04 MED ORDER — SODIUM CHLORIDE 0.9 % IV BOLUS (SEPSIS)
1000.0000 mL | Freq: Once | INTRAVENOUS | Status: AC
  Administered 2013-11-04: 1000 mL via INTRAVENOUS

## 2013-11-04 MED ORDER — IOHEXOL 300 MG/ML  SOLN
50.0000 mL | Freq: Once | INTRAMUSCULAR | Status: AC | PRN
  Administered 2013-11-04: 50 mL via ORAL

## 2013-11-04 MED ORDER — IOHEXOL 300 MG/ML  SOLN
100.0000 mL | Freq: Once | INTRAMUSCULAR | Status: AC | PRN
  Administered 2013-11-04: 100 mL via INTRAVENOUS

## 2013-11-04 MED ORDER — DOCUSATE SODIUM 100 MG PO CAPS: 100.0000 mg | ORAL_CAPSULE | Freq: Two times a day (BID) | ORAL | Status: DC

## 2013-11-04 NOTE — ED Provider Notes (Signed)
CSN: [id number]     Arrival date & time 11/04/13  0149 History   First MD Initiated Contact with Patient 11/04/13 0214     Chief Complaint  Patient presents with  . Abdominal Pain    (Consider location/radiation/quality/duration/timing/severity/associated sxs/prior Treatment) HPI Comments: The patient is a 78 year old male with a history of SBO, HTN, DM II, and laparoscopic cholecystectomy (07/2013) with post operative abscess complications and sepsis. He presents today for abdominal pain x 2 days. Patient states the pain is intermittent and present in his supraumbilical region. He states that the pain improves after eating, but worsens 2-3 hours after eating. Patient has not taken anything for symptoms. He denies any other modifying factors. He states he has never had pain similar to this in the past. Patient's last bowel movement was yesterday which was formed and constipated, but free of blood. Patient denies any black stool or associated fever, chest pain, shortness of breath, nausea or vomiting, diarrhea, dysuria or hematuria, back pain, numbness/tingling, and weakness.  Patient is a 78 y.o. male presenting with abdominal pain. The history is provided by the patient. No language interpreter was used.  Abdominal Pain Associated symptoms: constipation     Past Medical History  Diagnosis Date  . Hypertension   . Small bowel obstruction   . BPH (benign prostatic hyperplasia)   . Neuropathy   . Polio   . Polio Childhood  . Constipation 05/13/2013  . Anemia 05/13/2013  . Type II diabetes mellitus   . Exertional shortness of breath     "sometimes" (07/25/2013)  . History of stomach ulcers   . Stroke 2014    residual:  "left hand kind of numb" (07/25/2013)  . Arthritis     "right leg" (07/25/2013)   Past Surgical History  Procedure Laterality Date  . Bowel resection    . Cataract extraction w/ intraocular lens  implant, bilateral Bilateral 2000's  . Colonoscopy      Hx: of  .  Laparoscopic cholecystectomy  07/25/2013    w/LOA (07/25/2013)  . Excisional hemorrhoidectomy  2000's  . Colon surgery    . Inguinal hernia repair Right 1970's  . Cholecystectomy N/A 07/25/2013    Procedure: LAPAROSCOPIC CHOLECYSTECTOMY WITH INTRAOPERATIVE CHOLANGIOGRAM;  Surgeon: [name], MD;  Location: [place];  Service: General;  Laterality: N/A;   Family History  Problem Relation Age of Onset  . Diabetes Sister   . Diabetes Sister   . Diabetes Brother   . Hypertension Sister   . Hypertension Sister   . Hypertension Brother   . Heart failure Mother   . Heart attack Sister    History  Substance Use Topics  . Smoking status: Never Smoker   . Smokeless tobacco: Never Used  . Alcohol Use: No    Review of Systems  Gastrointestinal: Positive for abdominal pain and constipation.  All other systems reviewed and are negative.     Allergies  Review of patient's allergies indicates no known allergies.  Home Medications   Prior to Admission medications   Medication Sig Start Date End Date Taking? Authorizing Provider  gabapentin (NEURONTIN) 800 MG tablet Take 800 mg by mouth 3 (three) times daily as needed (HEADACHES).  05/09/13   Historical Provider, MD  HYDROcodone-acetaminophen (NORCO/VICODIN) 5-325 MG per tablet Take 1 tablet by mouth every 6 (six) hours as needed for moderate pain. 09/08/13   Tiffany L Reed, DO  insulin aspart (NOVOLOG) 100 UNIT/ML injection Inject 5 Units into the skin 3 (three) times  daily before meals.     Historical Provider, MD  insulin glargine (LANTUS) 100 units/mL SOLN Inject 10 Units into the skin at bedtime. 09/06/13   [name], MD  Multiple Vitamin (MULTIVITAMIN WITH MINERALS) TABS tablet Take 1 tablet by mouth daily. 09/06/13   [name], MD  polyethylene glycol (MIRALAX / GLYCOLAX) packet Take 17 g by mouth daily. 09/06/13   [name], MD  predniSONE (DELTASONE) 5 MG tablet Take 5 mg by mouth daily with breakfast.     Historical Provider, MD  Tamsulosin HCl (FLOMAX) 0.4 MG CAPS Take 0.4 mg by mouth 2 (two) times daily.     Historical Provider, MD  Travoprost, BAK Free, (TRAVATAN) 0.004 % SOLN ophthalmic solution Place 1 drop into both eyes at bedtime.    Historical Provider, MD   BP 131/67  Pulse 94  Temp(Src) 98.9 F (37.2 C) (Oral)  Resp 16  SpO2 99%  Physical Exam  Nursing note and vitals reviewed. Constitutional: He is oriented to person, place, and time. He appears well-developed and well-nourished. No distress.  Nontoxic/nonseptic appearing. Patient in no visible or audible discomfort.  HENT:  Head: Normocephalic and atraumatic.  Mouth/Throat: Oropharynx is clear and moist. No oropharyngeal exudate.  Eyes: Conjunctivae and EOM are normal. No scleral icterus.  Neck: Normal range of motion.  Cardiovascular: Normal rate, regular rhythm and normal heart sounds.   Pulmonary/Chest: Effort normal and breath sounds normal. No respiratory distress. He has no wheezes. He has no rales.  Abdominal: Soft. He exhibits distension. He exhibits no mass. There is tenderness (diffuse, but worse in upper quadrants). There is no rebound and no guarding.  No involuntary guarding or peritoneal signs. No masses. Well-healed abdominal surgical scars.  Musculoskeletal: Normal range of motion.  Neurological: He is alert and oriented to person, place, and time.  GCS 15. Patient moves extremities without ataxia.  Skin: Skin is warm and dry. No rash noted. He is not diaphoretic. No erythema. No pallor.  Psychiatric: He has a normal mood and affect. His behavior is normal.    ED Course  Procedures (including critical care time) Labs Review Labs Reviewed  CBC WITH DIFFERENTIAL - Abnormal; Notable for the following:    Hemoglobin 12.2 (*)    HCT 37.3 (*)    All other components within normal limits  COMPREHENSIVE METABOLIC PANEL - Abnormal; Notable for the following:    Sodium 135 (*)    Glucose, Bld 100 (*)     Albumin 3.3 (*)    Alkaline Phosphatase 128 (*)    GFR calc non Af Amer 78 (*)    All other components within normal limits  URINALYSIS, ROUTINE W REFLEX MICROSCOPIC - Abnormal; Notable for the following:    Glucose, UA >1000 (*)    All other components within normal limits  LIPASE, BLOOD  URINE MICROSCOPIC-ADD ON    Imaging Review Ct Abdomen Pelvis W Contrast  11/04/2013   CLINICAL DATA:  Lower abdominal pain for the past 2 days.  EXAM: CT ABDOMEN AND PELVIS WITH CONTRAST  TECHNIQUE: Multidetector CT imaging of the abdomen and pelvis was performed using the standard protocol following bolus administration of intravenous contrast.  CONTRAST:  144mL OMNIPAQUE IOHEXOL 300 MG/ML  SOLN  COMPARISON:  CT of the abdomen and pelvis 09/04/2013.  FINDINGS: Lung Bases: Unremarkable.  Abdomen/Pelvis: Status post cholecystectomy. The previously noted pigtail drainage catheter in the inferior aspect of the right lobe of the liver has been removed. No residual hepatic  fluid collection identified to suggest residual abscess. 9 mm hypervascular lesion in segment 8 of the liver (image 11 of series 2), similar to prior studies, favored to represent a tiny flash fill cavernous hemangioma. No other hepatic lesions are noted. The appearance of the pancreas, spleen and bilateral adrenal glands is unremarkable. Numerous low-attenuation renal lesions bilaterally, many of which are too small to definitively characterize. The larger of these lesions are all compatible with simple cysts, with the largest of these simple cysts and exophytic 2.4 cm lesion extending off the lower pole of the left kidney.  Normal appendix. No significant volume of ascites. No pneumoperitoneum. No pathologic distention of small bowel. No lymphadenopathy identified within the abdomen or pelvis. Atherosclerosis throughout the abdominal and pelvic vasculature, without evidence of aneurysm. Prostate gland is enlarged measuring 5.4 x 4.9 cm. 5 mm  calcification in the posterior lateral aspect of the urinary bladder on the right side likely represents a bladder calculus.  Musculoskeletal: There are no aggressive appearing lytic or blastic lesions noted in the visualized portions of the skeleton. Levoscoliosis of the lumbar spine.  IMPRESSION: 1. Status post removal of percutaneous drainage catheter in the right lobe of the liver. All previously noted hepatic fluid collections have resolved. No signs of residual hepatic abscess at this time. 2. No new acute findings in the abdomen or pelvis. 3. Additional incidental findings, as above, similar prior studies.   Electronically Signed   By: Vinnie Langton M.D.   On: 11/04/2013 05:24     EKG Interpretation None      MDM   Final diagnoses:  Constipation  Abdominal pain    78 year old male with recent cholecystectomy in 08/5327 with complications of abdominal abscess presents to the emergency department for abdominal pain with associated constipation. Patient nontoxic and nonseptic appearing with a diffusely tender abdomen on deep palpation. No peritoneal signs or involuntary guarding. Workup today included labs and imaging. Labs consistent or improved from prior. No leukocytosis today. CT abdomen and pelvis shows improvement of intra-abdominal abscess sees without any acute abdominopelvic findings.  Patient has not required any pain medicine over ED stay. When offered, patient declines pain medicine. Symptoms likely secondary to constipation. Have advised the patient continue MiraLax. Will start Colace as well. Patient to followup with his primary care provider for further evaluation of symptoms as needed. Return precautions discussed and provided. Patient and wife agreeable to plan with no unaddressed concerns.   Filed Vitals:   11/04/13 0152 11/04/13 0540  BP: 128/65 131/67  Pulse: 109 94  Temp: 98.9 F (37.2 C)   TempSrc: Oral   Resp: 18 16  SpO2: 99% 99%     Antonietta Breach,  PA-C 11/04/13 [phone]

## 2013-11-04 NOTE — ED Notes (Signed)
Pt in CT.

## 2013-11-04 NOTE — Discharge Instructions (Signed)
Abdominal Pain, Adult °Many things can cause abdominal pain. Usually, abdominal pain is not caused by a disease and will improve without treatment. It can often be observed and treated at home. Your health care provider will do a physical exam and possibly order blood tests and X-rays to help determine the seriousness of your pain. However, in many cases, more time must pass before a clear cause of the pain can be found. Before that point, your health care provider may not know if you need more testing or further treatment. °HOME CARE INSTRUCTIONS  °Monitor your abdominal pain for any changes. The following actions may help to alleviate any discomfort you are experiencing: °· Only take over-the-counter or prescription medicines as directed by your health care provider. °· Do not take laxatives unless directed to do so by your health care provider. °· Try a clear liquid diet (broth, tea, or water) as directed by your health care provider. Slowly move to a bland diet as tolerated. °SEEK MEDICAL CARE IF: °· You have unexplained abdominal pain. °· You have abdominal pain associated with nausea or diarrhea. °· You have pain when you urinate or have a bowel movement. °· You experience abdominal pain that wakes you in the night. °· You have abdominal pain that is worsened or improved by eating food. °· You have abdominal pain that is worsened with eating fatty foods. °SEEK IMMEDIATE MEDICAL CARE IF:  °· Your pain does not go away within 2 hours. °· You have a fever. °· You keep throwing up (vomiting). °· Your pain is felt only in portions of the abdomen, such as the right side or the left lower portion of the abdomen. °· You pass bloody or black tarry stools. °MAKE SURE YOU: °· Understand these instructions.   °· Will watch your condition.   °· Will get help right away if you are not doing well or get worse.   °Document Released: 03/15/2005 Document Revised: 03/26/2013 Document Reviewed: 02/12/2013 °Exit[employer]® Patient  Information ©2014 [employer]. °Constipation, Adult °Constipation is when a person has fewer than 3 bowel movements a week; has difficulty having a bowel movement; or has stools that are dry, hard, or larger than normal. As people grow older, constipation is more common. If you try to fix constipation with medicines that make you have a bowel movement (laxatives), the problem may get worse. Long-term laxative use may cause the muscles of the colon to become weak. A low-fiber diet, not taking in enough fluids, and taking certain medicines may make constipation worse. °CAUSES  °· Certain medicines, such as antidepressants, pain medicine, iron supplements, antacids, and water pills.   °· Certain diseases, such as diabetes, irritable bowel syndrome (IBS), thyroid disease, or depression.   °· Not drinking enough water.   °· Not eating enough fiber-rich foods.   °· Stress or travel. °· Lack of physical activity or exercise. °· Not going to the restroom when there is the urge to have a bowel movement. °· Ignoring the urge to have a bowel movement. °· Using laxatives too much. °SYMPTOMS  °· Having fewer than 3 bowel movements a week.   °· Straining to have a bowel movement.   °· Having hard, dry, or larger than normal stools.   °· Feeling full or bloated.   °· Pain in the lower abdomen. °· Not feeling relief after having a bowel movement. °DIAGNOSIS  °Your caregiver will take a medical history and perform a physical exam. Further testing may be done for severe constipation. Some tests may include:  °· A barium enema   X-ray to examine your rectum, colon, and sometimes, your small intestine.  A sigmoidoscopy to examine your lower colon.  A colonoscopy to examine your entire colon. TREATMENT  Treatment will depend on the severity of your constipation and what is causing it. Some dietary treatments include drinking more fluids and eating more fiber-rich foods. Lifestyle treatments may include regular exercise. If these  diet and lifestyle recommendations do not help, your caregiver may recommend taking over-the-counter laxative medicines to help you have bowel movements. Prescription medicines may be prescribed if over-the-counter medicines do not work.  HOME CARE INSTRUCTIONS   Increase dietary fiber in your diet, such as fruits, vegetables, whole grains, and beans. Limit high-fat and processed sugars in your diet, such as [place] fries, hamburgers, cookies, candies, and soda.   A fiber supplement may be added to your diet if you cannot get enough fiber from foods.   Drink enough fluids to keep your urine clear or pale yellow.   Exercise regularly or as directed by your caregiver.   Go to the restroom when you have the urge to go. Do not hold it.  Only take medicines as directed by your caregiver. Do not take other medicines for constipation without talking to your caregiver first. [place] IF:   You have bright red blood in your stool.   Your constipation lasts for more than 4 days or gets worse.   You have abdominal or rectal pain.   You have thin, pencil-like stools.  You have unexplained weight loss. MAKE SURE YOU:   Understand these instructions.  Will watch your condition.  Will get help right away if you are not doing well or get worse. Document Released: 03/03/2004 Document Revised: 08/28/2011 Document Reviewed: 03/17/2013 [facility] Patient Information 2014 [place], [place]. Fiber Content in Foods Drinking plenty of fluids and consuming foods high in fiber can help with constipation. See the list below for the fiber content of some common foods. Starches and Grains / Dietary Fiber (g)  Cheerios, 1 cup / 3 g  Kellogg's Corn Flakes, 1 cup / 0.7 g  Rice Krispies, 1  cup / 0.3 g  Quaker Oat Life Cereal,  cup / 2.1 g  Oatmeal, instant (cooked),  cup / 2 g  Kellogg's Frosted Mini Wheats, 1 cup / 5.1 g  Rice, brown, long-grain (cooked), 1 cup / 3.5  g  Rice, white, long-grain (cooked), 1 cup / 0.6 g  Macaroni, cooked, enriched, 1 cup / 2.5 g Legumes / Dietary Fiber (g)  Beans, baked, canned, plain or vegetarian,  cup / 5.2 g  Beans, kidney, canned,  cup / 6.8 g  Beans, pinto, dried (cooked),  cup / 7.7 g  Beans, pinto, canned,  cup / 5.5 g Breads and Crackers / Dietary Fiber (g)  Graham crackers, plain or honey, 2 squares / 0.7 g  Saltine crackers, 3 squares / 0.3 g  Pretzels, plain, salted, 10 pieces / 1.8 g  Bread, whole-wheat, 1 slice / 1.9 g  Bread, white, 1 slice / 0.7 g  Bread, raisin, 1 slice / 1.2 g  Bagel, plain, 3 oz / 2 g  Tortilla, flour, 1 oz / 0.9 g  Tortilla, corn, 1 small / 1.5 g  Bun, hamburger or hotdog, 1 small / 0.9 g Fruits / Dietary Fiber (g)  Apple, raw with skin, 1 medium / 4.4 g  Applesauce, sweetened,  cup / 1.5 g  Banana,  medium / 1.5 g  Grapes, 10 grapes /  0.4 g  Orange, 1 small / 2.3 g  Raisin, 1.5 oz / 1.6 g  Melon, 1 cup / 1.4 g Vegetables / Dietary Fiber (g)  Green beans, canned,  cup / 1.3 g  Carrots (cooked),  cup / 2.3 g  Broccoli (cooked),  cup / 2.8 g  Peas, frozen (cooked),  cup / 4.4 g  Potatoes, mashed,  cup / 1.6 g  Lettuce, 1 cup / 0.5 g  Corn, canned,  cup / 1.6 g  Tomato,  cup / 1.1 g Document Released: 10/22/2006 Document Revised: 08/28/2011 Document Reviewed: 12/17/2006 [employer] Patient Information 2014 [place], [place].

## 2013-11-04 NOTE — ED Notes (Signed)
PA Kelly at bedside. 

## 2013-11-04 NOTE — ED Notes (Signed)
Pt states that he began having lower bad pain Sunday night; pt denies N/V/D; pt states "It just hurts real bad"; pt states that he is having trouble going to have a BM; pt reports last BM was on Sunday

## 2013-11-04 NOTE — ED Notes (Signed)
Pt aware of the need for a urine sample. 

## 2013-11-05 NOTE — ED Provider Notes (Signed)
.  Medical screening examination/treatment/procedure(s) were performed by non-physician practitioner and as supervising physician I was immediately available for consultation/collaboration.   EKG Interpretation None        Julianne Rice, MD 11/05/13 [phone]

## 2013-11-08 ENCOUNTER — Emergency Department (HOSPITAL_COMMUNITY)
Admission: EM | Admit: 2013-11-08 | Discharge: 2013-11-08 | Disposition: A | Discharge status: Home / Self Care | Payer: Medicare Other | Attending: Emergency Medicine | Admitting: Emergency Medicine

## 2013-11-08 ENCOUNTER — Encounter (HOSPITAL_COMMUNITY): Payer: Self-pay | Admitting: Emergency Medicine

## 2013-11-08 DIAGNOSIS — K5900 Constipation, unspecified: Secondary | ICD-10-CM | POA: Insufficient documentation

## 2013-11-08 DIAGNOSIS — N400 Enlarged prostate without lower urinary tract symptoms: Secondary | ICD-10-CM | POA: Insufficient documentation

## 2013-11-08 DIAGNOSIS — Z79899 Other long term (current) drug therapy: Secondary | ICD-10-CM | POA: Insufficient documentation

## 2013-11-08 DIAGNOSIS — Z8612 Personal history of poliomyelitis: Secondary | ICD-10-CM | POA: Insufficient documentation

## 2013-11-08 DIAGNOSIS — Z8669 Personal history of other diseases of the nervous system and sense organs: Secondary | ICD-10-CM | POA: Insufficient documentation

## 2013-11-08 DIAGNOSIS — I10 Essential (primary) hypertension: Secondary | ICD-10-CM | POA: Insufficient documentation

## 2013-11-08 DIAGNOSIS — M1710 Unilateral primary osteoarthritis, unspecified knee: Secondary | ICD-10-CM | POA: Insufficient documentation

## 2013-11-08 DIAGNOSIS — Z9889 Other specified postprocedural states: Secondary | ICD-10-CM | POA: Insufficient documentation

## 2013-11-08 DIAGNOSIS — E119 Type 2 diabetes mellitus without complications: Secondary | ICD-10-CM | POA: Insufficient documentation

## 2013-11-08 DIAGNOSIS — Z8711 Personal history of peptic ulcer disease: Secondary | ICD-10-CM | POA: Insufficient documentation

## 2013-11-08 DIAGNOSIS — Z8673 Personal history of transient ischemic attack (TIA), and cerebral infarction without residual deficits: Secondary | ICD-10-CM | POA: Insufficient documentation

## 2013-11-08 DIAGNOSIS — Z862 Personal history of diseases of the blood and blood-forming organs and certain disorders involving the immune mechanism: Secondary | ICD-10-CM | POA: Insufficient documentation

## 2013-11-08 DIAGNOSIS — Z794 Long term (current) use of insulin: Secondary | ICD-10-CM | POA: Insufficient documentation

## 2013-11-08 DIAGNOSIS — Z9089 Acquired absence of other organs: Secondary | ICD-10-CM | POA: Insufficient documentation

## 2013-11-08 DIAGNOSIS — R1013 Epigastric pain: Secondary | ICD-10-CM

## 2013-11-08 DIAGNOSIS — Z7982 Long term (current) use of aspirin: Secondary | ICD-10-CM | POA: Insufficient documentation

## 2013-11-08 DIAGNOSIS — IMO0002 Reserved for concepts with insufficient information to code with codable children: Secondary | ICD-10-CM

## 2013-11-08 LAB — COMPREHENSIVE METABOLIC PANEL
ALT: 31 U/L (ref 0–53)
AST: 25 U/L (ref 0–37)
Albumin: 3.2 g/dL — ABNORMAL LOW (ref 3.5–5.2)
Alkaline Phosphatase: 132 U/L — ABNORMAL HIGH (ref 39–117)
BUN: 11 mg/dL (ref 6–23)
CO2: 23 mEq/L (ref 19–32)
Calcium: 9.4 mg/dL (ref 8.4–10.5)
Chloride: 100 mEq/L (ref 96–112)
Creatinine, Ser: 1.06 mg/dL (ref 0.50–1.35)
GFR calc non Af Amer: 67 mL/min — ABNORMAL LOW (ref >90)
GFR, EST AFRICAN AMERICAN: 77 mL/min — AB (ref >90)
Glucose, Bld: 86 mg/dL (ref 70–99)
Potassium: 4.3 mEq/L (ref 3.7–5.3)
SODIUM: 136 meq/L — AB (ref 137–147)
TOTAL PROTEIN: 7.2 g/dL (ref 6.0–8.3)
Total Bilirubin: <0.2 mg/dL — ABNORMAL LOW (ref 0.3–1.2)

## 2013-11-08 LAB — CBC WITH DIFFERENTIAL/PLATELET
Basophils Absolute: 0 10*3/uL (ref 0.0–0.1)
Basophils Relative: 0 % (ref 0–1)
EOS ABS: 0 10*3/uL (ref 0.0–0.7)
EOS PCT: 0 % (ref 0–5)
HCT: 36.2 % — ABNORMAL LOW (ref 39.0–52.0)
Hemoglobin: 11.9 g/dL — ABNORMAL LOW (ref 13.0–17.0)
Lymphocytes Relative: 29 % (ref 12–46)
Lymphs Abs: 1.4 10*3/uL (ref 0.7–4.0)
MCH: 28.1 pg (ref 26.0–34.0)
MCHC: 32.9 g/dL (ref 30.0–36.0)
MCV: 85.4 fL (ref 78.0–100.0)
Monocytes Absolute: 0.3 10*3/uL (ref 0.1–1.0)
Monocytes Relative: 7 % (ref 3–12)
NEUTROS PCT: 64 % (ref 43–77)
Neutro Abs: 3.1 10*3/uL (ref 1.7–7.7)
PLATELETS: 273 10*3/uL (ref 150–400)
RBC: 4.24 MIL/uL (ref 4.22–5.81)
RDW: 14.6 % (ref 11.5–15.5)
WBC: 4.8 10*3/uL (ref 4.0–10.5)

## 2013-11-08 LAB — I-STAT TROPONIN, ED: TROPONIN I, POC: 0.01 ng/mL (ref 0.00–0.08)

## 2013-11-08 LAB — LIPASE, BLOOD: LIPASE: 26 U/L (ref 11–59)

## 2013-11-08 MED ORDER — FAMOTIDINE 20 MG PO TABS: 20.0000 mg | ORAL_TABLET | Freq: Two times a day (BID) | ORAL | Status: DC

## 2013-11-08 MED ORDER — GI COCKTAIL ~~LOC~~
30.0000 mL | Freq: Once | ORAL | Status: AC
  Administered 2013-11-08: 30 mL via ORAL
  Filled 2013-11-08: qty 30

## 2013-11-08 MED ORDER — FAMOTIDINE 20 MG PO TABS
20.0000 mg | ORAL_TABLET | Freq: Once | ORAL | Status: AC
  Administered 2013-11-08: 20 mg via ORAL
  Filled 2013-11-08: qty 1

## 2013-11-08 NOTE — ED Notes (Signed)
Pt requesting food

## 2013-11-08 NOTE — ED Provider Notes (Signed)
CSN: [id number]     Arrival date & time 11/08/13  7628 History   First MD Initiated Contact with Patient 11/08/13 0550     Chief Complaint  Patient presents with  . Abdominal Pain     (Consider location/radiation/quality/duration/timing/severity/associated sxs/prior Treatment) HPI Comments: Patient with anemia, high blood pressure, stroke, cholecystectomy, hepatic abscess history presents with recurrent epigastric pain for the past week. Pain is worse after eating. No chest pain or shortness of breath. Pain is mild and nonradiating. Mild ache sensation. No GI bleeding or other symptoms. Patient was seen recently had a CT scan of the abdomen with no acute findings. Patient has no history of recent ulcers however does recall years ago he had an ulcer.  Patient is a 78 y.o. male presenting with abdominal pain. The history is provided by the patient.  Abdominal Pain Associated symptoms: no chest pain, no chills, no dysuria, no fever, no shortness of breath and no vomiting     Past Medical History  Diagnosis Date  . Hypertension   . Small bowel obstruction   . BPH (benign prostatic hyperplasia)   . Neuropathy   . Polio   . Polio Childhood  . Constipation 05/13/2013  . Anemia 05/13/2013  . Type II diabetes mellitus   . Exertional shortness of breath     "sometimes" (07/25/2013)  . History of stomach ulcers   . Stroke 2014    residual:  "left hand kind of numb" (07/25/2013)  . Arthritis     "right leg" (07/25/2013)   Past Surgical History  Procedure Laterality Date  . Bowel resection    . Cataract extraction w/ intraocular lens  implant, bilateral Bilateral 2000's  . Colonoscopy      Hx: of  . Laparoscopic cholecystectomy  07/25/2013    w/LOA (07/25/2013)  . Excisional hemorrhoidectomy  2000's  . Colon surgery    . Inguinal hernia repair Right 1970's  . Cholecystectomy N/A 07/25/2013    Procedure: LAPAROSCOPIC CHOLECYSTECTOMY WITH INTRAOPERATIVE CHOLANGIOGRAM;  Surgeon: [name],  MD;  Location: [place];  Service: General;  Laterality: N/A;   Family History  Problem Relation Age of Onset  . Diabetes Sister   . Diabetes Sister   . Diabetes Brother   . Hypertension Sister   . Hypertension Sister   . Hypertension Brother   . Heart failure Mother   . Heart attack Sister    History  Substance Use Topics  . Smoking status: Never Smoker   . Smokeless tobacco: Never Used  . Alcohol Use: No    Review of Systems  Constitutional: Negative for fever and chills.  HENT: Negative for congestion.   Eyes: Negative for visual disturbance.  Respiratory: Negative for shortness of breath.   Cardiovascular: Negative for chest pain.  Gastrointestinal: Positive for abdominal pain. Negative for vomiting.  Genitourinary: Negative for dysuria and flank pain.  Musculoskeletal: Negative for back pain, neck pain and neck stiffness.  Skin: Negative for rash.  Neurological: Negative for light-headedness and headaches.      Allergies  Review of patient's allergies indicates no known allergies.  Home Medications   Prior to Admission medications   Medication Sig Start Date End Date Taking? Authorizing Provider  amLODipine (NORVASC) 5 MG tablet Take 1 tablet by mouth daily. 11/05/13   Historical Provider, MD  aspirin 325 MG tablet Take 325 mg by mouth daily.    Historical Provider, MD  atorvastatin (LIPITOR) 20 MG tablet Take 1 tablet by mouth every evening. 11/03/13  Historical Provider, MD  docusate sodium (COLACE) 100 MG capsule Take 1 capsule (100 mg total) by mouth every 12 (twelve) hours. 11/04/13   Antonietta Breach, PA-C  insulin aspart (NOVOLOG) 100 UNIT/ML injection Inject 5 Units into the skin 3 (three) times daily before meals.     Historical Provider, MD  insulin glargine (LANTUS) 100 units/mL SOLN Inject 10 Units into the skin at bedtime. 09/06/13   [name], MD  Multiple Vitamin (MULTIVITAMIN WITH MINERALS) TABS tablet Take 1 tablet by mouth daily. 09/06/13   [name], MD  polyethylene glycol (MIRALAX / GLYCOLAX) packet Take 17 g by mouth daily. 09/06/13   [name], MD  Tamsulosin HCl (FLOMAX) 0.4 MG CAPS Take 0.4 mg by mouth 2 (two) times daily.     Historical Provider, MD  Travoprost, BAK Free, (TRAVATAN) 0.004 % SOLN ophthalmic solution Place 1 drop into both eyes at bedtime.    Historical Provider, MD   BP 154/70  Pulse 86  Temp(Src) 97.9 F (36.6 C) (Oral)  Resp 16  Ht 5\' 5"  (1.651 m)  Wt 126 lb (57.153 kg)  BMI 20.97 kg/m2  SpO2 100% Physical Exam  Nursing note and vitals reviewed. Constitutional: He is oriented to person, place, and time. He appears well-developed and well-nourished.  HENT:  Head: Normocephalic and atraumatic.  Eyes: Conjunctivae are normal. Right eye exhibits no discharge. Left eye exhibits no discharge.  Neck: Normal range of motion. Neck supple. No tracheal deviation present.  Cardiovascular: Normal rate and regular rhythm.   Pulmonary/Chest: Effort normal and breath sounds normal.  Abdominal: Soft. He exhibits no distension. There is tenderness (mild epig). There is no guarding.  Musculoskeletal: He exhibits no edema.  Neurological: He is alert and oriented to person, place, and time.  Skin: Skin is warm. No rash noted.  Psychiatric: He has a normal mood and affect.    ED Course  Procedures (including critical care time) Labs Review Labs Reviewed  CBC WITH DIFFERENTIAL - Abnormal; Notable for the following:    Hemoglobin 11.9 (*)    HCT 36.2 (*)    All other components within normal limits  COMPREHENSIVE METABOLIC PANEL - Abnormal; Notable for the following:    Sodium 136 (*)    Albumin 3.2 (*)    Alkaline Phosphatase 132 (*)    Total Bilirubin <0.2 (*)    GFR calc non Af Amer 67 (*)    GFR calc Af Amer 77 (*)    All other components within normal limits  LIPASE, BLOOD  I-STAT TROPOININ, ED    Imaging Review No results found.   EKG Interpretation None      MDM   Final  diagnoses:  Epigastric pain   Patient with recurrent epigastric pain, clarified for one month. Similar previous. Abdominal exam very benign in ED. Recent CT scan unremarkable. I do not feel a repeat CT scan as indicated. Plan for blood work, Pepcid and GI cocktail with close outpatient followup. With ulcer history this may be the cause versus reflux versus other. Screening EKG and troponin with recurrent epigastric discomfort however highly unlikely cardiac. EKG reviewed no acute ischemia.  Pt improved in ED.   Fup outpt and with GI outpt.   Epigastric pain, diabetes  Mariea Clonts, MD 11/08/13 [id number]

## 2013-11-08 NOTE — ED Notes (Signed)
Patient is alert and oriented x3.  He is complaining of abdominal pain that started yesterday.   Currently he rates the pain 8 of 10 without nausea, vomiting or diarrhea.  Patient states that  He has had this pain before.

## 2013-11-08 NOTE — Discharge Instructions (Signed)
If you were given medicines take as directed.  If you are on coumadin or contraceptives realize their levels and effectiveness is altered by many different medicines.  If you have any reaction (rash, tongues swelling, other) to the medicines stop taking and see a physician.   Please follow up as directed and return to the ER or see a physician for new or worsening symptoms.  Thank you. Filed Vitals:   11/08/13 0553 11/08/13 0645 11/08/13 0755  BP: 154/70 146/76   Pulse: 86 80   Temp: 97.9 F (36.6 C) 98 F (36.7 C)   TempSrc: Oral Oral   Resp: 16 16 16   Height: 5\' 5"  (1.651 m)    Weight: 126 lb (57.153 kg)    SpO2: 100% 99% 100%

## 2013-12-05 ENCOUNTER — Encounter (HOSPITAL_BASED_OUTPATIENT_CLINIC_OR_DEPARTMENT_OTHER): Payer: Medicare Other | Attending: General Surgery

## 2013-12-05 DIAGNOSIS — L89609 Pressure ulcer of unspecified heel, unspecified stage: Secondary | ICD-10-CM | POA: Insufficient documentation

## 2013-12-05 DIAGNOSIS — Z8612 Personal history of poliomyelitis: Secondary | ICD-10-CM | POA: Insufficient documentation

## 2013-12-05 DIAGNOSIS — Z8673 Personal history of transient ischemic attack (TIA), and cerebral infarction without residual deficits: Secondary | ICD-10-CM | POA: Insufficient documentation

## 2013-12-05 DIAGNOSIS — G839 Paralytic syndrome, unspecified: Secondary | ICD-10-CM | POA: Insufficient documentation

## 2013-12-05 DIAGNOSIS — N400 Enlarged prostate without lower urinary tract symptoms: Secondary | ICD-10-CM | POA: Insufficient documentation

## 2013-12-05 DIAGNOSIS — L8990 Pressure ulcer of unspecified site, unspecified stage: Secondary | ICD-10-CM | POA: Insufficient documentation

## 2013-12-05 DIAGNOSIS — E119 Type 2 diabetes mellitus without complications: Secondary | ICD-10-CM | POA: Insufficient documentation

## 2013-12-08 NOTE — Progress Notes (Signed)
[facility]  NAME:  WHYATT, KLINGER NO.:  MEDICAL RECORD NO.:  [id number]      DATE OF BIRTH:  May 03, 1936  PHYSICIAN:  Ricard Dillon, M.D. VISIT DATE:  12/05/2013                                  OFFICE VISIT   LOCATION:  [place].  HISTORY OF PRESENT ILLNESS:  Mike Mack is a 78 year old man, who was hospitalized at [facility] in February for a laparoscopic cholecystectomy.  Apparently, there was secondary infection after this. The patient was discharged to [place] where he appears to have been a resonant up until the beginning of May. At some time during this time frame, he developed a wound on his right heel.  I am not exactly sure when, this was undoubtedly a pressure ulcer.  He is being followed by [facility]  They are here for our review of this.  PAST MEDICAL HISTORY: 1. Recent cholecystectomy with secondary gallbladder abscess. 2. Type 2 diabetes. 3. Mild paresis of the right leg secondary to polio according to his     wife, although I do not see this stated anywhere specifically. 4. Acute left thalamic cerebral infarction. 5. History of BPH.  CURRENT MEDICATIONS:  Reviewed.  PHYSICAL EXAMINATION:  VITAL SIGNS:  Temperature is 98.7, pulse 74, respirations 17, blood pressure 145/75.  CBG is 110. VASCULAR ASSESSMENT:  ABI is 1.24 on the right.  Dorsalis pedis pulse is palpable. WOUND EXAM:  The areas on his Achilles insertion site of his right heel measuring 1.8 x 1.2 x 0.1.  This was hypergranulated, but not infected. I removed the hypergranulation with #15 scalpel.  He tolerated this well.  We dressed this with collagen, Hydrogel, a foam heel cup wrapped in a Kerlix and net.  We will see him back in a week's time.  The orders were faxed to [facility].  They can change this once in the interim.           ______________________________ Ricard Dillon, M.D.     MGR/MEDQ  D:  12/05/2013  T:  12/06/2013  Job:  [id number]

## 2013-12-26 ENCOUNTER — Encounter (HOSPITAL_BASED_OUTPATIENT_CLINIC_OR_DEPARTMENT_OTHER): Payer: Medicare Other | Attending: General Surgery

## 2013-12-26 DIAGNOSIS — L8992 Pressure ulcer of unspecified site, stage 2: Secondary | ICD-10-CM | POA: Insufficient documentation

## 2013-12-26 DIAGNOSIS — E1169 Type 2 diabetes mellitus with other specified complication: Secondary | ICD-10-CM | POA: Diagnosis not present

## 2013-12-26 DIAGNOSIS — L97409 Non-pressure chronic ulcer of unspecified heel and midfoot with unspecified severity: Secondary | ICD-10-CM | POA: Diagnosis not present

## 2013-12-26 DIAGNOSIS — L89609 Pressure ulcer of unspecified heel, unspecified stage: Secondary | ICD-10-CM | POA: Diagnosis not present

## 2014-01-02 DIAGNOSIS — L89609 Pressure ulcer of unspecified heel, unspecified stage: Secondary | ICD-10-CM | POA: Diagnosis not present

## 2014-01-02 DIAGNOSIS — L8992 Pressure ulcer of unspecified site, stage 2: Secondary | ICD-10-CM | POA: Diagnosis not present

## 2014-01-02 DIAGNOSIS — E1169 Type 2 diabetes mellitus with other specified complication: Secondary | ICD-10-CM | POA: Diagnosis not present

## 2014-01-02 DIAGNOSIS — L97409 Non-pressure chronic ulcer of unspecified heel and midfoot with unspecified severity: Secondary | ICD-10-CM | POA: Diagnosis not present

## 2014-01-09 DIAGNOSIS — L89609 Pressure ulcer of unspecified heel, unspecified stage: Secondary | ICD-10-CM | POA: Diagnosis not present

## 2014-01-09 DIAGNOSIS — E1169 Type 2 diabetes mellitus with other specified complication: Secondary | ICD-10-CM | POA: Diagnosis not present

## 2014-01-09 DIAGNOSIS — L97409 Non-pressure chronic ulcer of unspecified heel and midfoot with unspecified severity: Secondary | ICD-10-CM | POA: Diagnosis not present

## 2014-01-09 DIAGNOSIS — L8992 Pressure ulcer of unspecified site, stage 2: Secondary | ICD-10-CM | POA: Diagnosis not present

## 2014-01-16 DIAGNOSIS — L89609 Pressure ulcer of unspecified heel, unspecified stage: Secondary | ICD-10-CM | POA: Diagnosis not present

## 2014-01-16 DIAGNOSIS — L8992 Pressure ulcer of unspecified site, stage 2: Secondary | ICD-10-CM | POA: Diagnosis not present

## 2014-01-16 DIAGNOSIS — L97409 Non-pressure chronic ulcer of unspecified heel and midfoot with unspecified severity: Secondary | ICD-10-CM | POA: Diagnosis not present

## 2014-01-16 DIAGNOSIS — E1169 Type 2 diabetes mellitus with other specified complication: Secondary | ICD-10-CM | POA: Diagnosis not present

## 2014-01-23 ENCOUNTER — Encounter (HOSPITAL_BASED_OUTPATIENT_CLINIC_OR_DEPARTMENT_OTHER): Payer: Medicare Other | Attending: General Surgery

## 2014-01-23 DIAGNOSIS — L8992 Pressure ulcer of unspecified site, stage 2: Secondary | ICD-10-CM | POA: Diagnosis not present

## 2014-01-23 DIAGNOSIS — L89609 Pressure ulcer of unspecified heel, unspecified stage: Secondary | ICD-10-CM | POA: Insufficient documentation

## 2014-06-26 DIAGNOSIS — Z125 Encounter for screening for malignant neoplasm of prostate: Secondary | ICD-10-CM | POA: Diagnosis not present

## 2014-06-26 DIAGNOSIS — I10 Essential (primary) hypertension: Secondary | ICD-10-CM | POA: Diagnosis not present

## 2014-06-26 DIAGNOSIS — E789 Disorder of lipoprotein metabolism, unspecified: Secondary | ICD-10-CM | POA: Diagnosis not present

## 2014-06-26 DIAGNOSIS — E118 Type 2 diabetes mellitus with unspecified complications: Secondary | ICD-10-CM | POA: Diagnosis not present

## 2014-06-26 DIAGNOSIS — Z79899 Other long term (current) drug therapy: Secondary | ICD-10-CM | POA: Diagnosis not present

## 2014-07-02 DIAGNOSIS — H462 Nutritional optic neuropathy: Secondary | ICD-10-CM | POA: Diagnosis not present

## 2014-07-02 DIAGNOSIS — E11359 Type 2 diabetes mellitus with proliferative diabetic retinopathy without macular edema: Secondary | ICD-10-CM | POA: Diagnosis not present

## 2014-07-02 DIAGNOSIS — H53013 Deprivation amblyopia, bilateral: Secondary | ICD-10-CM | POA: Diagnosis not present

## 2014-07-03 DIAGNOSIS — I10 Essential (primary) hypertension: Secondary | ICD-10-CM | POA: Diagnosis not present

## 2014-07-03 DIAGNOSIS — E118 Type 2 diabetes mellitus with unspecified complications: Secondary | ICD-10-CM | POA: Diagnosis not present

## 2014-07-03 DIAGNOSIS — N400 Enlarged prostate without lower urinary tract symptoms: Secondary | ICD-10-CM | POA: Diagnosis not present

## 2014-07-03 DIAGNOSIS — E789 Disorder of lipoprotein metabolism, unspecified: Secondary | ICD-10-CM | POA: Diagnosis not present

## 2014-07-15 DIAGNOSIS — E1151 Type 2 diabetes mellitus with diabetic peripheral angiopathy without gangrene: Secondary | ICD-10-CM | POA: Diagnosis not present

## 2014-07-15 DIAGNOSIS — L602 Onychogryphosis: Secondary | ICD-10-CM | POA: Diagnosis not present

## 2014-07-23 DIAGNOSIS — M353 Polymyalgia rheumatica: Secondary | ICD-10-CM | POA: Diagnosis not present

## 2014-07-23 DIAGNOSIS — M150 Primary generalized (osteo)arthritis: Secondary | ICD-10-CM | POA: Diagnosis not present

## 2014-07-29 DIAGNOSIS — R209 Unspecified disturbances of skin sensation: Secondary | ICD-10-CM | POA: Diagnosis not present

## 2014-07-31 DIAGNOSIS — L299 Pruritus, unspecified: Secondary | ICD-10-CM | POA: Diagnosis not present

## 2014-07-31 DIAGNOSIS — E118 Type 2 diabetes mellitus with unspecified complications: Secondary | ICD-10-CM | POA: Diagnosis not present

## 2014-08-27 DIAGNOSIS — E118 Type 2 diabetes mellitus with unspecified complications: Secondary | ICD-10-CM | POA: Diagnosis not present

## 2014-09-03 DIAGNOSIS — E11359 Type 2 diabetes mellitus with proliferative diabetic retinopathy without macular edema: Secondary | ICD-10-CM | POA: Diagnosis not present

## 2014-09-03 DIAGNOSIS — H462 Nutritional optic neuropathy: Secondary | ICD-10-CM | POA: Diagnosis not present

## 2014-09-03 DIAGNOSIS — H4720 Unspecified optic atrophy: Secondary | ICD-10-CM | POA: Diagnosis not present

## 2014-09-03 DIAGNOSIS — H53013 Deprivation amblyopia, bilateral: Secondary | ICD-10-CM | POA: Diagnosis not present

## 2014-09-04 DIAGNOSIS — L300 Nummular dermatitis: Secondary | ICD-10-CM | POA: Diagnosis not present

## 2014-09-04 DIAGNOSIS — E118 Type 2 diabetes mellitus with unspecified complications: Secondary | ICD-10-CM | POA: Diagnosis not present

## 2014-10-02 DIAGNOSIS — N401 Enlarged prostate with lower urinary tract symptoms: Secondary | ICD-10-CM | POA: Diagnosis not present

## 2014-10-02 DIAGNOSIS — N3941 Urge incontinence: Secondary | ICD-10-CM | POA: Diagnosis not present

## 2014-10-02 DIAGNOSIS — N138 Other obstructive and reflux uropathy: Secondary | ICD-10-CM | POA: Diagnosis not present

## 2014-10-05 DIAGNOSIS — M353 Polymyalgia rheumatica: Secondary | ICD-10-CM | POA: Diagnosis not present

## 2014-10-05 DIAGNOSIS — M150 Primary generalized (osteo)arthritis: Secondary | ICD-10-CM | POA: Diagnosis not present

## 2014-10-05 DIAGNOSIS — M5416 Radiculopathy, lumbar region: Secondary | ICD-10-CM | POA: Diagnosis not present

## 2014-10-07 DIAGNOSIS — R209 Unspecified disturbances of skin sensation: Secondary | ICD-10-CM | POA: Diagnosis not present

## 2014-10-23 DIAGNOSIS — L602 Onychogryphosis: Secondary | ICD-10-CM | POA: Diagnosis not present

## 2014-10-23 DIAGNOSIS — E1151 Type 2 diabetes mellitus with diabetic peripheral angiopathy without gangrene: Secondary | ICD-10-CM | POA: Diagnosis not present

## 2014-10-25 ENCOUNTER — Inpatient Hospital Stay (HOSPITAL_COMMUNITY)
Admission: EM | Admit: 2014-10-25 | Discharge: 2014-10-28 | DRG: 683 | Disposition: A | Discharge status: Home / Self Care | Payer: Commercial Managed Care - HMO | Attending: Internal Medicine | Admitting: Internal Medicine

## 2014-10-25 ENCOUNTER — Encounter (HOSPITAL_COMMUNITY): Payer: Self-pay | Admitting: Emergency Medicine

## 2014-10-25 ENCOUNTER — Emergency Department (HOSPITAL_COMMUNITY): Payer: Commercial Managed Care - HMO

## 2014-10-25 DIAGNOSIS — N179 Acute kidney failure, unspecified: Principal | ICD-10-CM | POA: Diagnosis present

## 2014-10-25 DIAGNOSIS — Z794 Long term (current) use of insulin: Secondary | ICD-10-CM

## 2014-10-25 DIAGNOSIS — Z9842 Cataract extraction status, left eye: Secondary | ICD-10-CM | POA: Diagnosis not present

## 2014-10-25 DIAGNOSIS — E860 Dehydration: Secondary | ICD-10-CM | POA: Diagnosis present

## 2014-10-25 DIAGNOSIS — Z8612 Personal history of poliomyelitis: Secondary | ICD-10-CM | POA: Diagnosis present

## 2014-10-25 DIAGNOSIS — Z9841 Cataract extraction status, right eye: Secondary | ICD-10-CM

## 2014-10-25 DIAGNOSIS — N390 Urinary tract infection, site not specified: Secondary | ICD-10-CM | POA: Diagnosis present

## 2014-10-25 DIAGNOSIS — E119 Type 2 diabetes mellitus without complications: Secondary | ICD-10-CM | POA: Diagnosis not present

## 2014-10-25 DIAGNOSIS — R4020 Unspecified coma: Secondary | ICD-10-CM | POA: Diagnosis not present

## 2014-10-25 DIAGNOSIS — N400 Enlarged prostate without lower urinary tract symptoms: Secondary | ICD-10-CM | POA: Diagnosis not present

## 2014-10-25 DIAGNOSIS — R404 Transient alteration of awareness: Secondary | ICD-10-CM | POA: Diagnosis not present

## 2014-10-25 DIAGNOSIS — R918 Other nonspecific abnormal finding of lung field: Secondary | ICD-10-CM | POA: Diagnosis not present

## 2014-10-25 DIAGNOSIS — M1990 Unspecified osteoarthritis, unspecified site: Secondary | ICD-10-CM | POA: Diagnosis present

## 2014-10-25 DIAGNOSIS — R55 Syncope and collapse: Secondary | ICD-10-CM | POA: Diagnosis not present

## 2014-10-25 DIAGNOSIS — I10 Essential (primary) hypertension: Secondary | ICD-10-CM | POA: Diagnosis present

## 2014-10-25 DIAGNOSIS — Z961 Presence of intraocular lens: Secondary | ICD-10-CM | POA: Diagnosis present

## 2014-10-25 DIAGNOSIS — Z8673 Personal history of transient ischemic attack (TIA), and cerebral infarction without residual deficits: Secondary | ICD-10-CM | POA: Diagnosis not present

## 2014-10-25 DIAGNOSIS — Z7982 Long term (current) use of aspirin: Secondary | ICD-10-CM

## 2014-10-25 DIAGNOSIS — Z8711 Personal history of peptic ulcer disease: Secondary | ICD-10-CM

## 2014-10-25 DIAGNOSIS — Z9049 Acquired absence of other specified parts of digestive tract: Secondary | ICD-10-CM | POA: Diagnosis present

## 2014-10-25 LAB — COMPREHENSIVE METABOLIC PANEL
ALT: 20 U/L (ref 17–63)
ANION GAP: 7 (ref 5–15)
AST: 16 U/L (ref 15–41)
Albumin: 3.2 g/dL — ABNORMAL LOW (ref 3.5–5.0)
Alkaline Phosphatase: 127 U/L — ABNORMAL HIGH (ref 38–126)
BUN: 17 mg/dL (ref 6–20)
CALCIUM: 8.5 mg/dL — AB (ref 8.9–10.3)
CO2: 26 mmol/L (ref 22–32)
CREATININE: 2.15 mg/dL — AB (ref 0.61–1.24)
Chloride: 103 mmol/L (ref 101–111)
GFR, EST AFRICAN AMERICAN: 33 mL/min — AB (ref >60)
GFR, EST NON AFRICAN AMERICAN: 28 mL/min — AB (ref >60)
GLUCOSE: 249 mg/dL — AB (ref 70–99)
Potassium: 4.9 mmol/L (ref 3.5–5.1)
SODIUM: 136 mmol/L (ref 135–145)
TOTAL PROTEIN: 7.1 g/dL (ref 6.5–8.1)
Total Bilirubin: 0.7 mg/dL (ref 0.3–1.2)

## 2014-10-25 LAB — CBC WITH DIFFERENTIAL/PLATELET
BASOS ABS: 0 10*3/uL (ref 0.0–0.1)
Basophils Relative: 0 % (ref 0–1)
EOS ABS: 0 10*3/uL (ref 0.0–0.7)
EOS PCT: 1 % (ref 0–5)
HCT: 35.5 % — ABNORMAL LOW (ref 39.0–52.0)
Hemoglobin: 11 g/dL — ABNORMAL LOW (ref 13.0–17.0)
LYMPHS PCT: 20 % (ref 12–46)
Lymphs Abs: 1.3 10*3/uL (ref 0.7–4.0)
MCH: 26.7 pg (ref 26.0–34.0)
MCHC: 31 g/dL (ref 30.0–36.0)
MCV: 86.2 fL (ref 78.0–100.0)
Monocytes Absolute: 0.4 10*3/uL (ref 0.1–1.0)
Monocytes Relative: 6 % (ref 3–12)
Neutro Abs: 4.6 10*3/uL (ref 1.7–7.7)
Neutrophils Relative %: 73 % (ref 43–77)
Platelets: 234 10*3/uL (ref 150–400)
RBC: 4.12 MIL/uL — ABNORMAL LOW (ref 4.22–5.81)
RDW: 17.4 % — AB (ref 11.5–15.5)
WBC: 6.3 10*3/uL (ref 4.0–10.5)

## 2014-10-25 LAB — URINALYSIS, ROUTINE W REFLEX MICROSCOPIC
Bilirubin Urine: NEGATIVE
Glucose, UA: >1000 mg/dL — AB
Ketones, ur: NEGATIVE mg/dL
Nitrite: NEGATIVE
Protein, ur: NEGATIVE mg/dL
Specific Gravity, Urine: 1.02 (ref 1.005–1.030)
Urobilinogen, UA: 0.2 mg/dL (ref 0.0–1.0)
pH: 7 (ref 5.0–8.0)

## 2014-10-25 LAB — URINE MICROSCOPIC-ADD ON

## 2014-10-25 LAB — TROPONIN I: Troponin I: <0.03 ng/mL (ref <0.031)

## 2014-10-25 LAB — GLUCOSE, CAPILLARY
GLUCOSE-CAPILLARY: 188 mg/dL — AB (ref 70–99)
Glucose-Capillary: 182 mg/dL — ABNORMAL HIGH (ref 70–99)

## 2014-10-25 LAB — MRSA PCR SCREENING: MRSA by PCR: NEGATIVE

## 2014-10-25 MED ORDER — DEXTROSE 5 % IV SOLN
1.0000 g | INTRAVENOUS | Status: DC
  Administered 2014-10-25 – 2014-10-27 (×3): 1 g via INTRAVENOUS
  Filled 2014-10-25 (×4): qty 10

## 2014-10-25 MED ORDER — INSULIN DETEMIR 100 UNIT/ML ~~LOC~~ SOLN
10.0000 [IU] | Freq: Every day | SUBCUTANEOUS | Status: DC
  Administered 2014-10-25 – 2014-10-28 (×4): 10 [IU] via SUBCUTANEOUS
  Filled 2014-10-25 (×4): qty 0.1

## 2014-10-25 MED ORDER — ASPIRIN 325 MG PO TABS
325.0000 mg | ORAL_TABLET | Freq: Every day | ORAL | Status: DC
  Administered 2014-10-26 – 2014-10-28 (×3): 325 mg via ORAL
  Filled 2014-10-25 (×3): qty 1

## 2014-10-25 MED ORDER — PIOGLITAZONE HCL 30 MG PO TABS
30.0000 mg | ORAL_TABLET | Freq: Every day | ORAL | Status: DC
  Administered 2014-10-26 – 2014-10-28 (×3): 30 mg via ORAL
  Filled 2014-10-25 (×4): qty 1

## 2014-10-25 MED ORDER — SODIUM CHLORIDE 0.9 % IV SOLN
INTRAVENOUS | Status: DC
  Administered 2014-10-25: 18:00:00 via INTRAVENOUS

## 2014-10-25 MED ORDER — SODIUM CHLORIDE 0.9 % IJ SOLN
3.0000 mL | Freq: Two times a day (BID) | INTRAMUSCULAR | Status: DC
  Administered 2014-10-27 – 2014-10-28 (×3): 3 mL via INTRAVENOUS

## 2014-10-25 MED ORDER — SODIUM CHLORIDE 0.9 % IV BOLUS (SEPSIS)
500.0000 mL | Freq: Once | INTRAVENOUS | Status: AC
  Administered 2014-10-25: 500 mL via INTRAVENOUS

## 2014-10-25 MED ORDER — INSULIN ASPART 100 UNIT/ML ~~LOC~~ SOLN
0.0000 [IU] | Freq: Three times a day (TID) | SUBCUTANEOUS | Status: DC
  Administered 2014-10-26: 2 [IU] via SUBCUTANEOUS
  Administered 2014-10-27: 1 [IU] via SUBCUTANEOUS
  Administered 2014-10-27: 2 [IU] via SUBCUTANEOUS
  Administered 2014-10-28: 1 [IU] via SUBCUTANEOUS

## 2014-10-25 MED ORDER — ADULT MULTIVITAMIN W/MINERALS CH
1.0000 | ORAL_TABLET | Freq: Every day | ORAL | Status: DC
  Administered 2014-10-26 – 2014-10-28 (×3): 1 via ORAL
  Filled 2014-10-25 (×3): qty 1

## 2014-10-25 MED ORDER — DOCUSATE SODIUM 100 MG PO CAPS
100.0000 mg | ORAL_CAPSULE | Freq: Two times a day (BID) | ORAL | Status: DC
  Administered 2014-10-25 – 2014-10-28 (×6): 100 mg via ORAL
  Filled 2014-10-25 (×8): qty 1

## 2014-10-25 MED ORDER — ATORVASTATIN CALCIUM 20 MG PO TABS
20.0000 mg | ORAL_TABLET | Freq: Every evening | ORAL | Status: DC
  Administered 2014-10-25 – 2014-10-27 (×3): 20 mg via ORAL
  Filled 2014-10-25 (×4): qty 1

## 2014-10-25 MED ORDER — SODIUM CHLORIDE 0.9 % IV SOLN
INTRAVENOUS | Status: DC
  Administered 2014-10-25: 14:00:00 via INTRAVENOUS

## 2014-10-25 MED ORDER — INSULIN DETEMIR 100 UNIT/ML ~~LOC~~ SOLN
20.0000 [IU] | Freq: Every day | SUBCUTANEOUS | Status: DC
  Administered 2014-10-25 – 2014-10-27 (×3): 20 [IU] via SUBCUTANEOUS
  Filled 2014-10-25 (×4): qty 0.2

## 2014-10-25 MED ORDER — POLYETHYLENE GLYCOL 3350 17 G PO PACK
17.0000 g | PACK | Freq: Every day | ORAL | Status: DC
  Administered 2014-10-26 – 2014-10-28 (×3): 17 g via ORAL
  Filled 2014-10-25 (×4): qty 1

## 2014-10-25 NOTE — ED Notes (Signed)
Passed out at church while sitting; no fall, no injury. On EMS arrival was alert, but clammy with weak pulse. BP was 99/58 (states usually hypertensive; took his medication this morning). Given 500 ml fluid by EMS.

## 2014-10-25 NOTE — ED Notes (Signed)
Patient returned from CT

## 2014-10-25 NOTE — ED Notes (Signed)
Patient transported to CT 

## 2014-10-25 NOTE — ED Provider Notes (Signed)
CSN: [id number]     Arrival date & time 10/25/14  1259 History   First MD Initiated Contact with Patient 10/25/14 1307     Chief Complaint  Patient presents with  . Loss of Consciousness     (Consider location/radiation/quality/duration/timing/severity/associated sxs/prior Treatment) Patient is a 79 y.o. male presenting with syncope. The history is provided by the patient.  Loss of Consciousness Associated symptoms: diaphoresis and weakness   Associated symptoms: no chest pain, no confusion, no fever, no headaches, no nausea, no seizures, no shortness of breath and no vomiting    the patient was at church sudden syncopal event not feeling poorly before hand. No symptoms are prodrome just prior. Following it EMS noted that he was clammy weak pulse but he is alert by the time they arrived. Blood pressure was 99 systolic when they got there. Patient stated was feeling fine all day yesterday and today until this event. Denies any chest pain headache shortness of breath trouble breathing belly pain feeling nauseated or vomiting. Patient was given 500 mL normal saline bolus of in route. Patient is now asymptomatic. Patient said history of prior stroke affecting the right side and polio affecting the right side. Patient's left leg is shorter than the right.  Past Medical History  Diagnosis Date  . Hypertension   . Small bowel obstruction   . BPH (benign prostatic hyperplasia)   . Neuropathy   . Polio   . Polio Childhood  . Constipation 05/13/2013  . Anemia 05/13/2013  . Type II diabetes mellitus   . Exertional shortness of breath     "sometimes" (07/25/2013)  . History of stomach ulcers   . Stroke 2014    residual:  "left hand kind of numb" (07/25/2013)  . Arthritis     "right leg" (07/25/2013)   Past Surgical History  Procedure Laterality Date  . Bowel resection    . Cataract extraction w/ intraocular lens  implant, bilateral Bilateral 2000's  . Colonoscopy      Hx: of  . Laparoscopic  cholecystectomy  07/25/2013    w/LOA (07/25/2013)  . Excisional hemorrhoidectomy  2000's  . Colon surgery    . Inguinal hernia repair Right 1970's  . Cholecystectomy N/A 07/25/2013    Procedure: LAPAROSCOPIC CHOLECYSTECTOMY WITH INTRAOPERATIVE CHOLANGIOGRAM;  Surgeon: [name], MD;  Location: [place];  Service: General;  Laterality: N/A;   Family History  Problem Relation Age of Onset  . Diabetes Sister   . Diabetes Sister   . Diabetes Brother   . Hypertension Sister   . Hypertension Sister   . Hypertension Brother   . Heart failure Mother   . Heart attack Sister    History  Substance Use Topics  . Smoking status: Never Smoker   . Smokeless tobacco: Never Used  . Alcohol Use: No    Review of Systems  Constitutional: Positive for diaphoresis. Negative for fever.  HENT: Negative for congestion.   Respiratory: Negative for cough and shortness of breath.   Cardiovascular: Positive for syncope. Negative for chest pain.  Gastrointestinal: Negative for nausea, vomiting, abdominal pain and diarrhea.  Genitourinary: Negative for dysuria.  Musculoskeletal: Negative for back pain and neck pain.  Skin: Negative for rash.  Neurological: Positive for syncope and weakness. Negative for seizures, facial asymmetry, speech difficulty and headaches.  Hematological: Does not bruise/bleed easily.  Psychiatric/Behavioral: Negative for confusion.      Allergies  Review of patient's allergies indicates no known allergies.  Home Medications   Prior  to Admission medications   Medication Sig Start Date End Date Taking? Authorizing Provider  amLODipine (NORVASC) 5 MG tablet Take 1 tablet by mouth daily. 11/05/13   Historical Provider, MD  aspirin 325 MG tablet Take 325 mg by mouth daily.    Historical Provider, MD  atorvastatin (LIPITOR) 20 MG tablet Take 1 tablet by mouth every evening. 11/03/13   Historical Provider, MD  docusate sodium (COLACE) 100 MG capsule Take 1 capsule (100 mg total) by  mouth every 12 (twelve) hours. 11/04/13   Antonietta Breach, PA-C  famotidine (PEPCID) 20 MG tablet Take 1 tablet (20 mg total) by mouth 2 (two) times daily. 11/08/13   Elnora Morrison, MD  insulin aspart (NOVOLOG) 100 UNIT/ML injection Inject 5 Units into the skin 3 (three) times daily before meals.     Historical Provider, MD  insulin glargine (LANTUS) 100 units/mL SOLN Inject 10 Units into the skin at bedtime. 09/06/13   [name], MD  Multiple Vitamin (MULTIVITAMIN WITH MINERALS) TABS tablet Take 1 tablet by mouth daily. 09/06/13   [name], MD  polyethylene glycol (MIRALAX / GLYCOLAX) packet Take 17 g by mouth daily. 09/06/13   [name], MD  Tamsulosin HCl (FLOMAX) 0.4 MG CAPS Take 0.4 mg by mouth 2 (two) times daily.     Historical Provider, MD  Travoprost, BAK Free, (TRAVATAN) 0.004 % SOLN ophthalmic solution Place 1 drop into both eyes at bedtime.    Historical Provider, MD   BP 127/70 mmHg  Pulse 77  Temp(Src) 97.8 F (36.6 C) (Oral)  Resp 13  Ht 5\' 5"  (1.651 m)  Wt 170 lb (77.111 kg)  BMI 28.29 kg/m2  SpO2 97% Physical Exam  Constitutional: He is oriented to person, place, and time. He appears well-developed and well-nourished. No distress.  HENT:  Head: Normocephalic and atraumatic.  Mouth/Throat: Oropharynx is clear and moist.  Eyes: Conjunctivae and EOM are normal. Pupils are equal, round, and reactive to light.  Neck: Normal range of motion. Thyromegaly present.  Cardiovascular: Normal rate, regular rhythm and normal heart sounds.   No murmur heard. Pulmonary/Chest: Effort normal and breath sounds normal. No respiratory distress.  Abdominal: Soft. Bowel sounds are normal. There is no tenderness.  Musculoskeletal: Normal range of motion.  Left leg shorter than right that is congenital  Neurological: He is alert and oriented to person, place, and time. No cranial nerve deficit. He exhibits normal muscle tone.  Except for some weakness on the right side leg  greater than arm. This is pre-existing.  Skin: Skin is warm. No rash noted.  Nursing note and vitals reviewed.   ED Course  Procedures (including critical care time) Labs Review Labs Reviewed  COMPREHENSIVE METABOLIC PANEL - Abnormal; Notable for the following:    Glucose, Bld 249 (*)    Creatinine, Ser 2.15 (*)    Calcium 8.5 (*)    Albumin 3.2 (*)    Alkaline Phosphatase 127 (*)    GFR calc non Af Amer 28 (*)    GFR calc Af Amer 33 (*)    All other components within normal limits  CBC WITH DIFFERENTIAL/PLATELET - Abnormal; Notable for the following:    RBC 4.12 (*)    Hemoglobin 11.0 (*)    HCT 35.5 (*)    RDW 17.4 (*)    All other components within normal limits  URINALYSIS, ROUTINE W REFLEX MICROSCOPIC   Results for orders placed or performed during the hospital encounter of 10/25/14  Comprehensive  metabolic panel  Result Value Ref Range   Sodium 136 135 - 145 mmol/L   Potassium 4.9 3.5 - 5.1 mmol/L   Chloride 103 101 - 111 mmol/L   CO2 26 22 - 32 mmol/L   Glucose, Bld 249 (H) 70 - 99 mg/dL   BUN 17 6 - 20 mg/dL   Creatinine, Ser 2.15 (H) 0.61 - 1.24 mg/dL   Calcium 8.5 (L) 8.9 - 10.3 mg/dL   Total Protein 7.1 6.5 - 8.1 g/dL   Albumin 3.2 (L) 3.5 - 5.0 g/dL   AST 16 15 - 41 U/L   ALT 20 17 - 63 U/L   Alkaline Phosphatase 127 (H) 38 - 126 U/L   Total Bilirubin 0.7 0.3 - 1.2 mg/dL   GFR calc non Af Amer 28 (L) >60 mL/min   GFR calc Af Amer 33 (L) >60 mL/min   Anion gap 7 5 - 15  CBC with Differential/Platelet  Result Value Ref Range   WBC 6.3 4.0 - 10.5 K/uL   RBC 4.12 (L) 4.22 - 5.81 MIL/uL   Hemoglobin 11.0 (L) 13.0 - 17.0 g/dL   HCT 35.5 (L) 39.0 - 52.0 %   MCV 86.2 78.0 - 100.0 fL   MCH 26.7 26.0 - 34.0 pg   MCHC 31.0 30.0 - 36.0 g/dL   RDW 17.4 (H) 11.5 - 15.5 %   Platelets 234 150 - 400 K/uL   Neutrophils Relative % 73 43 - 77 %   Neutro Abs 4.6 1.7 - 7.7 K/uL   Lymphocytes Relative 20 12 - 46 %   Lymphs Abs 1.3 0.7 - 4.0 K/uL   Monocytes Relative  6 3 - 12 %   Monocytes Absolute 0.4 0.1 - 1.0 K/uL   Eosinophils Relative 1 0 - 5 %   Eosinophils Absolute 0.0 0.0 - 0.7 K/uL   Basophils Relative 0 0 - 1 %   Basophils Absolute 0.0 0.0 - 0.1 K/uL     Imaging Review Ct Head Wo Contrast  10/25/2014   CLINICAL DATA:  Syncope.  No fall.  EXAM: CT HEAD WITHOUT CONTRAST  TECHNIQUE: Contiguous axial images were obtained from the base of the skull through the vertex without intravenous contrast.  COMPARISON:  03/27/2013 and brain MR dated 04/10/2012.  FINDINGS: Diffusely enlarged ventricles and subarachnoid spaces. Patchy white matter low density in both cerebral hemispheres. Stable old bilateral cerebellar lacunar infarcts. No intracranial hemorrhage, mass lesion or CT evidence of acute infarction. Unremarkable bones and included paranasal sinuses.  IMPRESSION: 1. No acute abnormality. 2. Stable atrophy, chronic small vessel white matter ischemic changes and old cerebellar infarcts.   Electronically Signed   By: Claudie Revering M.D.   On: 10/25/2014 15:45   Dg Chest Port 1 View  10/25/2014   CLINICAL DATA:  Pt had LOC at church this morning. Pt was hypotensive and clammy to the touch. Hx HTN (took medication this morning) and diabetes  EXAM: PORTABLE CHEST - 1 VIEW  COMPARISON:  08/29/2013  FINDINGS: Lung volumes are relatively low. There is mild hazy opacity at the lung bases, left greater than right. This is likely mild atelectasis. There may be a small left effusion. Pneumonia is not excluded but felt less likely. There is no pulmonary edema. No pneumothorax. Cardiac silhouette is normal in size. Normal mediastinal and hilar contours.  Bony thorax is demineralized but grossly intact.  IMPRESSION: Mild lung base opacity, left greater than right, likely atelectasis. Pneumonia should be considered if there are consistent  clinical symptoms. No other evidence of an acute abnormality   Electronically Signed   By: Lajean Manes M.D.   On: 10/25/2014 14:15     EKG  Interpretation   Date/Time:  Sunday Oct 25 2014 13:05:51 EDT Ventricular Rate:  70 PR Interval:  200 QRS Duration: 92 QT Interval:  399 QTC Calculation: 430 R Axis:   -5 Text Interpretation:  Sinus rhythm Low voltage, precordial leads Consider  anterior infarct Confirmed by Luisenrique Conran  MD, Lawan Nanez [phone]) on 10/25/2014  1:13:27 PM      MDM   Final diagnoses:  Syncope  Syncope  Syncope    The patient was at church was sitting and had sudden onset of the syncopal event no prodrome felt fine earlier today. When EMS arrived patient was alert but clammy with weak pulse. Blood pressure was 99 systolic. Blood sugar was fine. Patient given 500 mL of fluid by EMS. However patient was not feeling bad prior to the event. This all of a sudden he was out best he can remember. Patient's had a stroke in the past which has affected his left side of his body. And patient also had the polio when he was young that affected that side as well.  Head CT here today shows nothing acute. Labs show a little bit worsening of his renal function BUN is okay but creatinine is up. No significant anemia no leukocytosis. Chest x-ray raises some concern for perhaps an early pneumonia at the base. However patient really has no upper rest for the symptoms at all no fevers no cough in my cassette no leukocytosis.  Patient will require admission for the syncopal event and cardiac monitoring overnight. The EKG shows no arrhythmias and cardiac monitoring while in the emergency department shows no arrhythmias.  Patient is followed by [facility]. Should be a hospitalist admission.    Fredia Sorrow, MD 10/25/14 [phone]

## 2014-10-25 NOTE — Progress Notes (Signed)
Pt. Arrived to the unit. Pt. Is alert and oriented with no signs of distress noted. Pt. Vitals appear stable with no skin issues noted. Wife at bedside. Educated pt. On use of staff numbers, room telephone and call bell. Call light within reach. Orders released. No further needs noted at this time.

## 2014-10-25 NOTE — H&P (Signed)
PCP:   [name], MD   Chief Complaint:  Passed out in church  HPI: 79 yo male h/o HTN, bph, dm was sitting in church today when he suddenly passed out.  No prodrome.  In normal state of health prior to this happening.  When he woke up he was on the floor, ems was called found to have sbp 99 which pt and wife is low for him.  No fevers.  Having some dysuria but no hematuria.  Was recently started on vesicare by his urologist about 2 weeks ago for urinary frequency.  No other new medications.  Eating and drinking normal.  Has not been experiencing any dizziness or weakness upon standing.  Did not experience any chest pain, sob, or numbness/tingling anywhere surrounding the syncopal event.  No palpitations.  No recent leg swelling or edema.  Pt feels back to normal now, received 500cc ivf bolus with ems.  Cardiac monitoring has also been unrevealing in the ED/and EMS.    Review of Systems:  Positive and negative as per HPI otherwise all other systems are negative  Past Medical History: Past Medical History  Diagnosis Date  . Hypertension   . Small bowel obstruction   . BPH (benign prostatic hyperplasia)   . Neuropathy   . Polio   . Polio Childhood  . Constipation 05/13/2013  . Anemia 05/13/2013  . Type II diabetes mellitus   . Exertional shortness of breath     "sometimes" (07/25/2013)  . History of stomach ulcers   . Stroke 2014    residual:  "left hand kind of numb" (07/25/2013)  . Arthritis     "right leg" (07/25/2013)   Past Surgical History  Procedure Laterality Date  . Bowel resection    . Cataract extraction w/ intraocular lens  implant, bilateral Bilateral 2000's  . Colonoscopy      Hx: of  . Laparoscopic cholecystectomy  07/25/2013    w/LOA (07/25/2013)  . Excisional hemorrhoidectomy  2000's  . Colon surgery    . Inguinal hernia repair Right 1970's  . Cholecystectomy N/A 07/25/2013    Procedure: LAPAROSCOPIC CHOLECYSTECTOMY WITH INTRAOPERATIVE CHOLANGIOGRAM;   Surgeon: [name], MD;  Location: [facility];  Service: General;  Laterality: N/A;    Medications: Prior to Admission medications   Medication Sig Start Date End Date Taking? Authorizing Provider  amLODipine (NORVASC) 5 MG tablet Take 1 tablet by mouth daily. 11/05/13  Yes Historical Provider, MD  aspirin 325 MG tablet Take 325 mg by mouth daily.   Yes Historical Provider, MD  atorvastatin (LIPITOR) 20 MG tablet Take 1 tablet by mouth every evening. 11/03/13  Yes Historical Provider, MD  docusate sodium (COLACE) 100 MG capsule Take 1 capsule (100 mg total) by mouth every 12 (twelve) hours. 11/04/13  Yes Antonietta Breach, PA-C  insulin detemir (LEVEMIR) 100 UNIT/ML injection Inject 10-20 Units into the skin 2 (two) times daily. 10 units in the morning, 20 units in the evening   Yes Historical Provider, MD  Multiple Vitamin (MULTIVITAMIN WITH MINERALS) TABS tablet Take 1 tablet by mouth daily. 09/06/13  Yes [name], MD  polyethylene glycol (MIRALAX / GLYCOLAX) packet Take 17 g by mouth daily. 09/06/13  Yes [name], MD  Tamsulosin HCl (FLOMAX) 0.4 MG CAPS Take 0.4 mg by mouth 2 (two) times daily.    Yes Historical Provider, MD  Travoprost, BAK Free, (TRAVATAN) 0.004 % SOLN ophthalmic solution Place 1 drop into both eyes at bedtime.   Yes  Historical Provider, MD  famotidine (PEPCID) 20 MG tablet Take 1 tablet (20 mg total) by mouth 2 (two) times daily. Patient not taking: Reported on 10/25/2014 11/08/13   Elnora Morrison, MD  insulin aspart (NOVOLOG) 100 UNIT/ML injection Inject 5 Units into the skin 3 (three) times daily before meals.     Historical Provider, MD  insulin glargine (LANTUS) 100 units/mL SOLN Inject 10 Units into the skin at bedtime. Patient not taking: Reported on 10/25/2014 09/06/13   [name], MD    Allergies:  No Known Allergies  Social History:  reports that he has never smoked. He has never used smokeless tobacco. He reports that he does not drink alcohol  or use illicit drugs.  Family History: Family History  Problem Relation Age of Onset  . Diabetes Sister   . Diabetes Sister   . Diabetes Brother   . Hypertension Sister   . Hypertension Sister   . Hypertension Brother   . Heart failure Mother   . Heart attack Sister     Physical Exam: Filed Vitals:   10/25/14 1304 10/25/14 1308 10/25/14 1345 10/25/14 1500  BP:  120/61 132/69 127/70  Pulse:  71 72 77  Temp:  97.8 F (36.6 C)    TempSrc:  Oral    Resp:  14 13 13   Height: 5\' 5"  (1.651 m)     Weight: 77.111 kg (170 lb)     SpO2:  95% 96% 97%   General appearance: alert, cooperative and no distress Head: Normocephalic, without obvious abnormality, atraumatic Eyes: negative Nose: Nares normal. Septum midline. Mucosa normal. No drainage or sinus tenderness. Neck: no JVD and supple, symmetrical, trachea midline Lungs: clear to auscultation bilaterally Heart: regular rate and rhythm, S1, S2 normal, no murmur, click, rub or gallop Abdomen: soft, non-tender; bowel sounds normal; no masses,  no organomegaly Extremities: extremities normal, atraumatic, no cyanosis or edema Pulses: 2+ and symmetric Skin: Skin color, texture, turgor normal. No rashes or lesions Neurologic: Grossly normal    Labs on Admission:   Recent Labs  10/25/14 1339  NA 136  K 4.9  CL 103  CO2 26  GLUCOSE 249*  BUN 17  CREATININE 2.15*  CALCIUM 8.5*    Recent Labs  10/25/14 1339  AST 16  ALT 20  ALKPHOS 127*  BILITOT 0.7  PROT 7.1  ALBUMIN 3.2*    Recent Labs  10/25/14 1339  WBC 6.3  NEUTROABS 4.6  HGB 11.0*  HCT 35.5*  MCV 86.2  PLT 234   Radiological Exams on Admission: Ct Head Wo Contrast  10/25/2014   CLINICAL DATA:  Syncope.  No fall.  EXAM: CT HEAD WITHOUT CONTRAST  TECHNIQUE: Contiguous axial images were obtained from the base of the skull through the vertex without intravenous contrast.  COMPARISON:  03/27/2013 and brain MR dated 04/10/2012.  FINDINGS: Diffusely enlarged  ventricles and subarachnoid spaces. Patchy white matter low density in both cerebral hemispheres. Stable old bilateral cerebellar lacunar infarcts. No intracranial hemorrhage, mass lesion or CT evidence of acute infarction. Unremarkable bones and included paranasal sinuses.  IMPRESSION: 1. No acute abnormality. 2. Stable atrophy, chronic small vessel white matter ischemic changes and old cerebellar infarcts.   Electronically Signed   By: Claudie Revering M.D.   On: 10/25/2014 15:45   Dg Chest Port 1 View  10/25/2014   CLINICAL DATA:  Pt had LOC at church this morning. Pt was hypotensive and clammy to the touch. Hx HTN (took medication this morning) and  diabetes  EXAM: PORTABLE CHEST - 1 VIEW  COMPARISON:  08/29/2013  FINDINGS: Lung volumes are relatively low. There is mild hazy opacity at the lung bases, left greater than right. This is likely mild atelectasis. There may be a small left effusion. Pneumonia is not excluded but felt less likely. There is no pulmonary edema. No pneumothorax. Cardiac silhouette is normal in size. Normal mediastinal and hilar contours.  Bony thorax is demineralized but grossly intact.  IMPRESSION: Mild lung base opacity, left greater than right, likely atelectasis. Pneumonia should be considered if there are consistent clinical symptoms. No other evidence of an acute abnormality   Electronically Signed   By: Lajean Manes M.D.   On: 10/25/2014 14:15    Assessment/Plan  79 yo male with syncopal episode today of unclear etiology  Principal Problem:   Syncope-  nonfocal neuro exam.  Cr is elevated at 2 and baseline is unknown.  Check orthostatic vitals.  Monitor on tele overnight.  freq neuro checks.  Gentle ivf overnight.  Glucose is normal.    Active Problems:   Hypertension-  Stable, borderline low bp with ems, nml now, hold bp meds overnight.   AKI (acute kidney injury)-  Unknown if ckd component.  ivf overnight, repeat cr in am.  Obtain records from pcp   H/O: CVA  (cerebrovascular accident)- noted   H/O poliomyelitis-noted   DM II (diabetes mellitus, type II), controlled-  ssi  possible uti- rocephin.  Urine cx ordered.  obs on tele floor.  Full code.  DAVID,RACHAL A 10/25/2014, 4:20 PM

## 2014-10-25 NOTE — ED Notes (Signed)
Admitting team at bedside; will transport to 5W when assessment complete.

## 2014-10-26 DIAGNOSIS — Z961 Presence of intraocular lens: Secondary | ICD-10-CM | POA: Diagnosis present

## 2014-10-26 DIAGNOSIS — Z8673 Personal history of transient ischemic attack (TIA), and cerebral infarction without residual deficits: Secondary | ICD-10-CM | POA: Diagnosis not present

## 2014-10-26 DIAGNOSIS — Z8612 Personal history of poliomyelitis: Secondary | ICD-10-CM | POA: Diagnosis not present

## 2014-10-26 DIAGNOSIS — R55 Syncope and collapse: Secondary | ICD-10-CM | POA: Diagnosis present

## 2014-10-26 DIAGNOSIS — Z9842 Cataract extraction status, left eye: Secondary | ICD-10-CM | POA: Diagnosis not present

## 2014-10-26 DIAGNOSIS — Z9049 Acquired absence of other specified parts of digestive tract: Secondary | ICD-10-CM | POA: Diagnosis present

## 2014-10-26 DIAGNOSIS — I10 Essential (primary) hypertension: Secondary | ICD-10-CM | POA: Diagnosis present

## 2014-10-26 DIAGNOSIS — M1990 Unspecified osteoarthritis, unspecified site: Secondary | ICD-10-CM | POA: Diagnosis present

## 2014-10-26 DIAGNOSIS — N390 Urinary tract infection, site not specified: Secondary | ICD-10-CM | POA: Diagnosis present

## 2014-10-26 DIAGNOSIS — Z7982 Long term (current) use of aspirin: Secondary | ICD-10-CM | POA: Diagnosis not present

## 2014-10-26 DIAGNOSIS — N400 Enlarged prostate without lower urinary tract symptoms: Secondary | ICD-10-CM | POA: Diagnosis present

## 2014-10-26 DIAGNOSIS — E119 Type 2 diabetes mellitus without complications: Secondary | ICD-10-CM | POA: Diagnosis present

## 2014-10-26 DIAGNOSIS — Z9841 Cataract extraction status, right eye: Secondary | ICD-10-CM | POA: Diagnosis not present

## 2014-10-26 DIAGNOSIS — Z794 Long term (current) use of insulin: Secondary | ICD-10-CM | POA: Diagnosis not present

## 2014-10-26 DIAGNOSIS — Z8711 Personal history of peptic ulcer disease: Secondary | ICD-10-CM | POA: Diagnosis not present

## 2014-10-26 DIAGNOSIS — N179 Acute kidney failure, unspecified: Secondary | ICD-10-CM | POA: Diagnosis present

## 2014-10-26 DIAGNOSIS — E860 Dehydration: Secondary | ICD-10-CM | POA: Diagnosis present

## 2014-10-26 LAB — BASIC METABOLIC PANEL
ANION GAP: 6 (ref 5–15)
BUN: 16 mg/dL (ref 6–20)
CO2: 23 mmol/L (ref 22–32)
CREATININE: 1.66 mg/dL — AB (ref 0.61–1.24)
Calcium: 8.2 mg/dL — ABNORMAL LOW (ref 8.9–10.3)
Chloride: 107 mmol/L (ref 101–111)
GFR calc Af Amer: 45 mL/min — ABNORMAL LOW (ref >60)
GFR calc non Af Amer: 38 mL/min — ABNORMAL LOW (ref >60)
Glucose, Bld: 123 mg/dL — ABNORMAL HIGH (ref 70–99)
POTASSIUM: 4.3 mmol/L (ref 3.5–5.1)
Sodium: 136 mmol/L (ref 135–145)

## 2014-10-26 LAB — CBC
HEMATOCRIT: 34.5 % — AB (ref 39.0–52.0)
Hemoglobin: 10.6 g/dL — ABNORMAL LOW (ref 13.0–17.0)
MCH: 26.3 pg (ref 26.0–34.0)
MCHC: 30.7 g/dL (ref 30.0–36.0)
MCV: 85.6 fL (ref 78.0–100.0)
Platelets: 245 10*3/uL (ref 150–400)
RBC: 4.03 MIL/uL — ABNORMAL LOW (ref 4.22–5.81)
RDW: 17.2 % — AB (ref 11.5–15.5)
WBC: 7.7 10*3/uL (ref 4.0–10.5)

## 2014-10-26 LAB — GLUCOSE, CAPILLARY
GLUCOSE-CAPILLARY: 96 mg/dL (ref 70–99)
GLUCOSE-CAPILLARY: 129 mg/dL — AB (ref 70–99)
Glucose-Capillary: 112 mg/dL — ABNORMAL HIGH (ref 70–99)
Glucose-Capillary: 101 mg/dL — ABNORMAL HIGH (ref 70–99)
Glucose-Capillary: 157 mg/dL — ABNORMAL HIGH (ref 70–99)

## 2014-10-26 LAB — TROPONIN I
Troponin I: <0.03 ng/mL (ref <0.031)
Troponin I: <0.03 ng/mL (ref <0.031)

## 2014-10-26 MED ORDER — ACETAMINOPHEN 325 MG PO TABS
650.0000 mg | ORAL_TABLET | Freq: Four times a day (QID) | ORAL | Status: DC | PRN
  Administered 2014-10-26 – 2014-10-27 (×2): 650 mg via ORAL
  Filled 2014-10-26 (×2): qty 2

## 2014-10-26 MED ORDER — LATANOPROST 0.005 % OP SOLN
1.0000 [drp] | Freq: Every day | OPHTHALMIC | Status: DC
  Administered 2014-10-26 – 2014-10-27 (×2): 1 [drp] via OPHTHALMIC
  Filled 2014-10-26: qty 2.5

## 2014-10-26 MED ORDER — SODIUM CHLORIDE 0.9 % IV SOLN: INTRAVENOUS | Status: DC

## 2014-10-26 MED ORDER — TAMSULOSIN HCL 0.4 MG PO CAPS
0.4000 mg | ORAL_CAPSULE | Freq: Two times a day (BID) | ORAL | Status: DC
  Administered 2014-10-26 – 2014-10-28 (×5): 0.4 mg via ORAL
  Filled 2014-10-26 (×6): qty 1

## 2014-10-26 MED ORDER — SODIUM CHLORIDE 0.9 % IV SOLN
INTRAVENOUS | Status: AC
  Administered 2014-10-26: 300 mL via INTRAVENOUS
  Administered 2014-10-26: 1000 mL via INTRAVENOUS

## 2014-10-26 MED ORDER — AMLODIPINE BESYLATE 5 MG PO TABS
5.0000 mg | ORAL_TABLET | Freq: Every day | ORAL | Status: DC
Start: 2014-10-26 — End: 2014-10-28
  Administered 2014-10-26 – 2014-10-28 (×3): 5 mg via ORAL
  Filled 2014-10-26 (×3): qty 1

## 2014-10-26 MED ORDER — HEPARIN SODIUM (PORCINE) 5000 UNIT/ML IJ SOLN
5000.0000 [IU] | Freq: Three times a day (TID) | INTRAMUSCULAR | Status: DC
  Administered 2014-10-26 – 2014-10-28 (×7): 5000 [IU] via SUBCUTANEOUS
  Filled 2014-10-26 (×6): qty 1

## 2014-10-26 NOTE — Evaluation (Signed)
Physical Therapy Evaluation Patient Details Name: Mike Mack MRN: [id number] DOB: Nov 26, 1935 Today's Date: 10/26/2014   History of Present Illness  79 yo male h/o HTN, bph, dm was sitting in church today when he suddenly passed out. No prodrome. In normal state of health prior to this happening. When he woke up he was on the floor, ems was called found to have sbp 99 which pt and wife is low for him.PMH also includes CVA and poliomyelits  Clinical Impression  Patient evaluated by Physical Therapy with no further acute PT needs identified. All education has been completed and the patient has no further questions. Pt mobilizing at baseline level, ambulated 200' with RW, mod I. No dizziness noted, see orthostatics below.  See below for any follow-up Physial Therapy or equipment needs. PT is signing off. Thank you for this referral.     Follow Up Recommendations No PT follow up    Equipment Recommendations  None recommended by PT    Recommendations for Other Services       Precautions / Restrictions Precautions Precautions: Fall Restrictions Weight Bearing Restrictions: No      Mobility  Bed Mobility Overal bed mobility: Needs Assistance Bed Mobility: Supine to Sit     Supine to sit: Min assist     General bed mobility comments: needed stable surface (therapist's arm) to be able to pull self up to EOB. Pt's wife reports that this is an issue at home as well and discussed ways to obtain a bed rail.   Transfers Overall transfer level: Modified independent Equipment used: Rolling walker (2 wheeled)             General transfer comment: stands safely to and from RW  Ambulation/Gait Ambulation/Gait assistance: Modified independent (Device/Increase time) Ambulation Distance (Feet): 200 Feet Assistive device: Rolling walker (2 wheeled) Gait Pattern/deviations: Decreased stance time - right;Decreased dorsiflexion - right;Decreased weight shift to right;Step-through  pattern;Trunk flexed Gait velocity: decreased, appropriate for household mobility Gait velocity interpretation:  (between 1.8 and 2/62 ft/ sec) General Gait Details: pt ambulates with right knee in extended position due to lack of right quad control, right foot turned out. Has left built up shoe. ALtered gait pattern due to right sided weakness but safe with RW and appears to be at baseline per himself and his wife  Financial trader [employer] (Stroke Patients Only)       Balance Overall balance assessment: Needs assistance Sitting-balance support: No upper extremity supported;Feet supported Sitting balance-Leahy Scale: Good     Standing balance support: No upper extremity supported Standing balance-Leahy Scale: Fair Standing balance comment: pt can maintain balance in static standing but needs UE support for safety with dynamic activity                             Pertinent Vitals/Pain Pain Assessment: No/denies pain  HR with ambulation 112 bpm   10/26/14 1022 10/26/14 1025 10/26/14 1033  Orthostatic Lying   BP- Lying 153/69 mmHg --  --   Orthostatic Sitting  BP- Sitting --  155/61 mmHg --   Pulse- Sitting --  80 --   Orthostatic Standing at 0 minutes  BP- Standing at 0 minutes --  --  181/75 mmHg  Pulse- Standing at 0 minutes --  --  102      Home Living Family/patient expects to be discharged  to:: Private residence Living Arrangements: Spouse/significant other Available Help at Discharge: Family;Available 24 hours/day Type of Home: House Home Access: Stairs to enter Entrance Stairs-Rails: Right;Left;Can reach both Entrance Stairs-Number of Steps: 3 in back or 4 in front Home Layout: One level Home Equipment: Walker - 2 wheels;Cane - single point;Wheelchair - manual;Shower seat (elevated toilet)      Prior Function Level of Independence: Needs assistance   Gait / Transfers Assistance Needed: independent with  RW  ADL's / Homemaking Assistance Needed: wife helps with socks and shoes  Comments: ambulates with RW, walks to mailbox and back 2x/ day. wife drives      Hand Dominance        Extremity/Trunk Assessment   Upper Extremity Assessment: Overall WFL for tasks assessed           Lower Extremity Assessment: RLE deficits/detail RLE Deficits / Details: weakened by polio as well as CVA, no quad activation (0/5), decreased strength hip flexors (3-/5) as well as dorsiflexors (2+/5). This ia baseline for pt.     Cervical / Trunk Assessment: Normal  Communication   Communication: No difficulties  Cognition Arousal/Alertness: Awake/alert Behavior During Therapy: WFL for tasks assessed/performed Overall Cognitive Status: Within Functional Limits for tasks assessed                      General Comments General comments (skin integrity, edema, etc.): discussed use of right knee brace in future if right knee buckles more frequently causing falls. This has not been the case so far, per pt    Exercises        Assessment/Plan    PT Assessment Patent does not need any further PT services  PT Diagnosis Abnormality of gait   PT Problem List    PT Treatment Interventions     PT Goals (Current goals can be found in the Care Plan section) Acute Rehab PT Goals Patient Stated Goal: return home PT Goal Formulation: All assessment and education complete, DC therapy    Frequency     Barriers to discharge        Co-evaluation               End of Session   Activity Tolerance: Patient tolerated treatment well Patient left: in chair;with call bell/phone within reach;with chair alarm set;with family/visitor present Nurse Communication: Mobility status    Functional Assessment Tool Used: clinical judgement Functional Limitation: Mobility: Walking and moving around Mobility: Walking and Moving Around Current Status (O7564): At least 1 percent but less than 20 percent  impaired, limited or restricted Mobility: Walking and Moving Around Goal Status [phone]): At least 1 percent but less than 20 percent impaired, limited or restricted Mobility: Walking and Moving Around Discharge Status [phone]): At least 1 percent but less than 20 percent impaired, limited or restricted    Time: 1011-1053 PT Time Calculation (min) (ACUTE ONLY): 42 min   Charges:   PT Evaluation $Initial PT Evaluation Tier I: 1 Procedure PT Treatments $Gait Training: 8-22 mins $Therapeutic Activity: 8-22 mins   PT G Codes:   PT G-Codes **NOT FOR INPATIENT CLASS** Functional Assessment Tool Used: clinical judgement Functional Limitation: Mobility: Walking and moving around Mobility: Walking and Moving Around Current Status (Y6063): At least 1 percent but less than 20 percent impaired, limited or restricted Mobility: Walking and Moving Around Goal Status [phone]): At least 1 percent but less than 20 percent impaired, limited or restricted Mobility: Walking and Moving Around Discharge Status [phone]): At  least 1 percent but less than 20 percent impaired, limited or restricted  Leighton Roach, PT  Acute Rehab Services  [place], [place] 10/26/2014, 12:07 PM

## 2014-10-26 NOTE — Progress Notes (Addendum)
PATIENT DETAILS Name: Mike Mack Age: 79 y.o. Sex: male Date of Birth: [birth year] Admit Date: 10/25/2014 Admitting Physician Phillips Grout, MD [id number] DENNIS, MD  Subjective: Patient is in no apparent distress lying comfortably in bed.   Assessment/Plan: Principal Problem:   Syncope: Likely due to dehydration in conjunction with UTI.  Urinalysis suggests UTI, and Creatinine was elevated which would suggest hypoperfusion.  Rehydrate with iv fluid treat UTI with Rocephin. Monitor in telemetry-negative so for, may need outpatient event monitor placement. Orthostatic vitals this morning negative  Active Problems:   Hypertension:  Slightly elevated. Start home medication of Amlodipine.     ARF:  Likely hypoperfusion injury.  Creatinine has improved from 2.15 yesterday to 1.66 today.  Continue current regimen.  Follow.    UTI: Continue Rocephin, await urine cultures. Remains afebrile and without leukocytosis.     H/O: CVA (cerebrovascular accident): No acute findings on head CT(10/25/14).  Some right sided weakness due to old infarct in conjunction with polio as a child.  Stable. Currently taking Aspirin.    QBH:ALPFXTKW Flomax    DM II (diabetes mellitus, type II)- well controlled on SSI.     History of poliomyelitis-with resultant right lower extremity atrophy/weakness  Disposition: Remain inpatient for iv antibiotics -home 5/10  Antimicrobial agents  See below  Anti-infectives    Start     Dose/Rate Route Frequency Ordered Stop   10/25/14 1715  cefTRIAXone (ROCEPHIN) 1 g in dextrose 5 % 50 mL IVPB     1 g 100 mL/hr over 30 Minutes Intravenous Every 24 hours 10/25/14 1701        DVT Prophylaxis:  Heparin   Code Status: Full code   Family Communication No family present in room, plan discussed with patient.   Procedures: None  CONSULTS:  None  Time spent 25 minutes-Greater than 50% of this time was spent in counseling, explanation  of diagnosis, planning of further management, and coordination of care.  MEDICATIONS: Scheduled Meds: . aspirin  325 mg Oral Daily  . atorvastatin  20 mg Oral QPM  . cefTRIAXone (ROCEPHIN)  IV  1 g Intravenous Q24H  . docusate sodium  100 mg Oral Q12H  . insulin aspart  0-9 Units Subcutaneous TID WC  . insulin detemir  10 Units Subcutaneous Daily  . insulin detemir  20 Units Subcutaneous QHS  . multivitamin with minerals  1 tablet Oral Daily  . pioglitazone  30 mg Oral Q breakfast  . polyethylene glycol  17 g Oral Daily  . sodium chloride  3 mL Intravenous Q12H   Continuous Infusions: . sodium chloride 300 mL (10/26/14 0801)   PRN Meds:.    PHYSICAL EXAM: Vital signs in last 24 hours: Filed Vitals:   10/25/14 1721 10/25/14 2102 10/26/14 0014 10/26/14 0514  BP: 183/79 149/74 146/66 145/63  Pulse:  87 86 82  Temp:  98.4 F (36.9 C) 98.4 F (36.9 C) 98.9 F (37.2 C)  TempSrc:  Oral Oral Oral  Resp:  16 16 16   Height:      Weight:    76.703 kg (169 lb 1.6 oz)  SpO2:  99% 99% 97%    Weight change:  Filed Weights   10/25/14 1304 10/26/14 0514  Weight: 77.111 kg (170 lb) 76.703 kg (169 lb 1.6 oz)   Body mass index is 28.14 kg/(m^2).   Gen Exam: Awake and alert with clear speech.  Lying  comfortably in bed.   Neck: Supple, No JVD present   Chest: B/L Clear. BL clear air movement.   CVS: S1 S2 Regular, no murmurs.  Abdomen: soft, BS +, non tender, + distention  Extremities: no edema, lower extremities warm to touch. Right leg atrophied due to polio and previous stroke. Neurologic: Non Focal.  Right leg slightly weaker.   Skin: No Rash or erythema noted  Wounds: N/A.    Intake/Output from previous day:  Intake/Output Summary (Last 24 hours) at 10/26/14 1035 Last data filed at 10/26/14 0801  Gross per 24 hour  Intake      0 ml  Output   1300 ml  Net  -1300 ml     LAB RESULTS: CBC  Recent Labs Lab 10/25/14 1339 10/26/14 0503  WBC 6.3 7.7  HGB 11.0* 10.6*   HCT 35.5* 34.5*  PLT 234 245  MCV 86.2 85.6  MCH 26.7 26.3  MCHC 31.0 30.7  RDW 17.4* 17.2*  LYMPHSABS 1.3  --   MONOABS 0.4  --   EOSABS 0.0  --   BASOSABS 0.0  --     Chemistries   Recent Labs Lab 10/25/14 1339 10/26/14 0503  NA 136 136  K 4.9 4.3  CL 103 107  CO2 26 23  GLUCOSE 249* 123*  BUN 17 16  CREATININE 2.15* 1.66*  CALCIUM 8.5* 8.2*    CBG:  Recent Labs Lab 10/25/14 1714 10/25/14 2223 10/26/14 0512 10/26/14 0814  GLUCAP 182* 188* 112* 101*    GFR Estimated Creatinine Clearance: 36.2 mL/min (by C-G formula based on Cr of 1.66).  Coagulation profile No results for input(s): INR, PROTIME in the last 168 hours.  Cardiac Enzymes  Recent Labs Lab 10/25/14 1822 10/25/14 2321 10/26/14 0503  TROPONINI <0.03 <0.03 <0.03    Invalid input(s): POCBNP No results for input(s): DDIMER in the last 72 hours. No results for input(s): HGBA1C in the last 72 hours. No results for input(s): CHOL, HDL, LDLCALC, TRIG, CHOLHDL, LDLDIRECT in the last 72 hours. No results for input(s): TSH, T4TOTAL, T3FREE, THYROIDAB in the last 72 hours.  Invalid input(s): FREET3 No results for input(s): VITAMINB12, FOLATE, FERRITIN, TIBC, IRON, RETICCTPCT in the last 72 hours. No results for input(s): LIPASE, AMYLASE in the last 72 hours.  Urine Studies No results for input(s): UHGB, CRYS in the last 72 hours.  Invalid input(s): UACOL, UAPR, USPG, UPH, UTP, UGL, UKET, UBIL, UNIT, UROB, ULEU, UEPI, UWBC, URBC, UBAC, CAST, UCOM, BILUA  MICROBIOLOGY: Recent Results (from the past 240 hour(s))  MRSA PCR Screening     Status: None   Collection Time: 10/25/14  5:52 PM  Result Value Ref Range Status   MRSA by PCR NEGATIVE NEGATIVE Final    Comment:        The GeneXpert MRSA Assay (FDA approved for NASAL specimens only), is one component of a comprehensive MRSA colonization surveillance program. It is not intended to diagnose MRSA infection nor to guide or monitor  treatment for MRSA infections.     RADIOLOGY STUDIES/RESULTS: Ct Head Wo Contrast  10/25/2014   CLINICAL DATA:  Syncope.  No fall.  EXAM: CT HEAD WITHOUT CONTRAST  TECHNIQUE: Contiguous axial images were obtained from the base of the skull through the vertex without intravenous contrast.  COMPARISON:  03/27/2013 and brain MR dated 04/10/2012.  FINDINGS: Diffusely enlarged ventricles and subarachnoid spaces. Patchy white matter low density in both cerebral hemispheres. Stable old bilateral cerebellar lacunar infarcts. No intracranial hemorrhage, mass lesion  or CT evidence of acute infarction. Unremarkable bones and included paranasal sinuses.  IMPRESSION: 1. No acute abnormality. 2. Stable atrophy, chronic small vessel white matter ischemic changes and old cerebellar infarcts.   Electronically Signed   By: Claudie Revering M.D.   On: 10/25/2014 15:45   Dg Chest Port 1 View  10/25/2014   CLINICAL DATA:  Pt had LOC at church this morning. Pt was hypotensive and clammy to the touch. Hx HTN (took medication this morning) and diabetes  EXAM: PORTABLE CHEST - 1 VIEW  COMPARISON:  08/29/2013  FINDINGS: Lung volumes are relatively low. There is mild hazy opacity at the lung bases, left greater than right. This is likely mild atelectasis. There may be a small left effusion. Pneumonia is not excluded but felt less likely. There is no pulmonary edema. No pneumothorax. Cardiac silhouette is normal in size. Normal mediastinal and hilar contours.  Bony thorax is demineralized but grossly intact.  IMPRESSION: Mild lung base opacity, left greater than right, likely atelectasis. Pneumonia should be considered if there are consistent clinical symptoms. No other evidence of an acute abnormality   Electronically Signed   By: Lajean Manes M.D.   On: 10/25/2014 14:15    Marlou Starks [facility] PA-S [facility] Pager:[phone]  If 7PM-7AM, please contact night-coverage [email or web address] Password [facility] 10/26/2014, 10:35  AM    Attending MD note  Patient was seen, examined,treatment plan was discussed with the PA-S.  I have personally reviewed the clinical findings, lab, imaging studies and management of this patient in detail. I agree with the documentation, as recorded by the PA-S.   79 year old male admitted with a syncopal episode-without any prior prodrome. Initially hypotensive in the field requiring fluid resuscitation by EMS. Subsequently found to have acute renal failure and possible UTI. Suspect syncopal episode likely hemodynamic mediated from hospital hypotension/dehydration and acute renal failure. Continue to monitor in telemetry, await echocardiogram. Suspect if workup continues to be negative will likely require a outpatient event monitor. See UTI, continue Rocephin and await urine culture. In regards to ARF-continue with IV fluids and avoid starting a/arms at this time. Await physical therapy evaluation.   [facility]

## 2014-10-26 NOTE — Progress Notes (Signed)
Late entry due to missed g-code.   11/14/14 1600  OT G-codes **NOT FOR INPATIENT CLASS**  Functional Assessment Tool Used clinical judgement  Functional Limitation Self care  Self Care Current Status [phone]) CI  Self Care Goal Status (Y8185) CI  Self Care Discharge Status [phone]) CI  11-14-2014 Nestor Lewandowsky, OTR/L Pager: [phone]

## 2014-10-26 NOTE — Evaluation (Signed)
Occupational Therapy Evaluation Patient Details Name: Mike Mack MRN: [id number] DOB: 12/12/1935 Today's Date: 10/26/2014    History of Present Illness 79 yo male h/o HTN, bph, dm was sitting in church today when he suddenly passed out. No prodrome. In normal state of health prior to this happening. When he woke up he was on the floor, ems was called found to have sbp 99 which pt and wife is low for him.PMH also includes CVA and poliomyelits   Clinical Impression   Pt is performing ADL and ADL mobility at is baseline. All DME needs are met at home.  No further OT needs. Signing off.   Follow Up Recommendations  No OT follow up    Equipment Recommendations  None recommended by OT    Recommendations for Other Services       Precautions / Restrictions Precautions Precautions: Fall Restrictions Weight Bearing Restrictions: No      Mobility Bed Mobility      General bed mobility comments: pt up in chair  Transfers Overall transfer level: Modified independent Equipment used: Rolling walker (2 wheeled)             General transfer comment: stands safely to and from RW    Balance Overall balance assessment: Needs assistance Sitting-balance support: No upper extremity supported;Feet supported Sitting balance-Leahy Scale: Good     Standing balance support: No upper extremity supported Standing balance-Leahy Scale: Fair Standing balance comment: pt can maintain balance in static standing but needs UE support for safety with dynamic activity                            ADL Overall ADL's : At baseline                                             Vision     Perception     Praxis      Pertinent Vitals/Pain Pain Assessment: No/denies pain     Hand Dominance Right   Extremity/Trunk Assessment Upper Extremity Assessment Upper Extremity Assessment: Overall WFL for tasks assessed      Cervical / Trunk  Assessment Cervical / Trunk Assessment: Normal   Communication Communication Communication: No difficulties   Cognition Arousal/Alertness: Awake/alert Behavior During Therapy: WFL for tasks assessed/performed Overall Cognitive Status: Within Functional Limits for tasks assessed       Memory: Decreased short-term memory             General Comments       Exercises       Shoulder Instructions      Home Living Family/patient expects to be discharged to:: Private residence Living Arrangements: Spouse/significant other Available Help at Discharge: Family;Available 24 hours/day Type of Home: House Home Access: Stairs to enter Entrance Stairs-Number of Steps: 3 in back or 4 in front Entrance Stairs-Rails: Right;Left;Can reach both Home Layout: One level     Bathroom Shower/Tub: Tub/shower unit   Bathroom Toilet: Handicapped height     Home Equipment: Walker - 2 wheels;Cane - single point;Wheelchair - manual;Shower seat;Grab bars - toilet;Grab bars - tub/shower;Hand held shower head          Prior Functioning/Environment Level of Independence: Needs assistance  Gait / Transfers Assistance Needed: independent with RW ADL's / Homemaking Assistance Needed: wife helps with socks and shoes   Comments: ambulates   with RW, walks to mailbox and back 2x/ day. wife drives     OT Diagnosis:     OT Problem List:     OT Treatment/Interventions:      OT Goals(Current goals can be found in the care plan section) Acute Rehab OT Goals Patient Stated Goal: return home  OT Frequency:     Barriers to D/C:            Co-evaluation              End of Session    Activity Tolerance: Patient tolerated treatment well Patient left: in chair;with call bell/phone within reach;with family/visitor present   Time: 1347-1400 OT Time Calculation (min): 13 min Charges:  OT General Charges $OT Visit: 1 Procedure OT Evaluation $Initial OT Evaluation Tier I: 1  Procedure G-Codes:    Malka So 10/26/2014, 2:04 PM  [phone]

## 2014-10-27 ENCOUNTER — Inpatient Hospital Stay (HOSPITAL_COMMUNITY): Payer: Commercial Managed Care - HMO

## 2014-10-27 DIAGNOSIS — Z8673 Personal history of transient ischemic attack (TIA), and cerebral infarction without residual deficits: Secondary | ICD-10-CM

## 2014-10-27 DIAGNOSIS — R55 Syncope and collapse: Secondary | ICD-10-CM

## 2014-10-27 LAB — BASIC METABOLIC PANEL
Anion gap: 9 (ref 5–15)
BUN: 20 mg/dL (ref 6–20)
CO2: 21 mmol/L — ABNORMAL LOW (ref 22–32)
Calcium: 8.7 mg/dL — ABNORMAL LOW (ref 8.9–10.3)
Chloride: 107 mmol/L (ref 101–111)
Creatinine, Ser: 1.52 mg/dL — ABNORMAL HIGH (ref 0.61–1.24)
GFR calc Af Amer: 50 mL/min — ABNORMAL LOW (ref >60)
GFR calc non Af Amer: 43 mL/min — ABNORMAL LOW (ref >60)
Glucose, Bld: 87 mg/dL (ref 70–99)
POTASSIUM: 4.4 mmol/L (ref 3.5–5.1)
Sodium: 137 mmol/L (ref 135–145)

## 2014-10-27 LAB — URINE CULTURE
COLONY COUNT: NO GROWTH
Culture: NO GROWTH

## 2014-10-27 LAB — GLUCOSE, CAPILLARY
GLUCOSE-CAPILLARY: 99 mg/dL (ref 70–99)
Glucose-Capillary: 84 mg/dL (ref 70–99)
Glucose-Capillary: 165 mg/dL — ABNORMAL HIGH (ref 70–99)
Glucose-Capillary: 136 mg/dL — ABNORMAL HIGH (ref 70–99)
Glucose-Capillary: 122 mg/dL — ABNORMAL HIGH (ref 70–99)

## 2014-10-27 NOTE — Progress Notes (Signed)
  Echocardiogram 2D Echocardiogram has been performed.  Mike Mack FRANCES 10/27/2014, 12:39 PM

## 2014-10-27 NOTE — Progress Notes (Addendum)
MD Ghimire paged, upon getting patient up to wheelchair for discharge, patient had acute onset of emesis and dry heaving lasting around 5 minutes. Patient denies chest pain, radiating jaw, or arm pain. HR 105, RR 20, BP 168/80, Temp 98.45F, SPO2 100%. Per MD Ghimire verbal order over phone, hold discharge, the patient will be held overnight. Will continue to monitor patient.

## 2014-10-27 NOTE — Progress Notes (Signed)
Pt's wife at [facility] that they have insurance through [employer], and PCP is Dr Henry Schein.

## 2014-10-27 NOTE — Discharge Summary (Addendum)
PATIENT DETAILS Name: Mike Mack Age: 79 y.o. Sex: male Date of Birth: 07-Apr-1936 MRN: [id number]. Admitting Physician: Phillips Grout, MD TDD:[id number],[id number] DENNIS, MD  Admit Date: 10/25/2014 Discharge date: 10/28/2014  Recommendations for Outpatient Follow-up:  1. Please recheck chemistries in 1 week, monitor cr and blood sugar control. SGLT 2 stopped. 2. Ensure follow-up with cardiology-please see below for discharge appointments.  PRIMARY DISCHARGE DIAGNOSIS:  Principal Problem:   Syncope Active Problems:   Hypertension   AKI (acute kidney injury)   H/O: CVA (cerebrovascular accident)   H/O poliomyelitis   DM II (diabetes mellitus, type II), controlled      PAST MEDICAL HISTORY: Past Medical History  Diagnosis Date  . Hypertension   . Small bowel obstruction   . BPH (benign prostatic hyperplasia)   . Neuropathy   . Polio   . Polio Childhood  . Constipation 05/13/2013  . Anemia 05/13/2013  . Type II diabetes mellitus   . Exertional shortness of breath     "sometimes" (07/25/2013)  . History of stomach ulcers   . Stroke 2014    residual:  "left hand kind of numb" (07/25/2013)  . Arthritis     "right leg" (07/25/2013)    DISCHARGE MEDICATIONS: Current Discharge Medication List    START taking these medications   Details  linagliptin (TRADJENTA) 5 MG TABS tablet Take 1 tablet (5 mg total) by mouth daily. Qty: 30 tablet, Refills: 3      CONTINUE these medications which have NOT CHANGED   Details  amLODipine (NORVASC) 5 MG tablet Take 1 tablet by mouth daily.    aspirin 325 MG tablet Take 325 mg by mouth daily.    atorvastatin (LIPITOR) 20 MG tablet Take 1 tablet by mouth every evening.    docusate sodium (COLACE) 100 MG capsule Take 1 capsule (100 mg total) by mouth every 12 (twelve) hours. Qty: 60 capsule, Refills: 0    insulin detemir (LEVEMIR) 100 UNIT/ML injection Inject 10-20 Units into the skin 2 (two) times daily. 10 units in the morning, 20 units  in the evening    irbesartan (AVAPRO) 300 MG tablet Take 300 mg by mouth daily. Refills: 99    Multiple Vitamin (MULTIVITAMIN WITH MINERALS) TABS tablet Take 1 tablet by mouth daily.    pioglitazone (ACTOS) 30 MG tablet Take 30 mg by mouth daily.    polyethylene glycol (MIRALAX / GLYCOLAX) packet Take 17 g by mouth daily. Qty: 14 each, Refills: 0    Tamsulosin HCl (FLOMAX) 0.4 MG CAPS Take 0.4 mg by mouth 2 (two) times daily.     Travoprost, BAK Free, (TRAVATAN) 0.004 % SOLN ophthalmic solution Place 1 drop into both eyes at bedtime.      STOP taking these medications     Empagliflozin-Linagliptin 25-5 MG TABS      insulin glargine (LANTUS) 100 units/mL SOLN         ALLERGIES:  No Known Allergies  BRIEF HPI:  See H&P, Labs, Consult and Test reports for all details in brief, patient is a 79 year old male with history of hypertension, diabetes who presented to the hospital for evaluation of a syncopal episode that occurred while patient was at church.  CONSULTATIONS:   None  PERTINENT RADIOLOGIC STUDIES: Ct Head Wo Contrast  10/25/2014   CLINICAL DATA:  Syncope.  No fall.  EXAM: CT HEAD WITHOUT CONTRAST  TECHNIQUE: Contiguous axial images were obtained from the base of the skull through the vertex without intravenous contrast.  COMPARISON:  03/27/2013 and brain MR dated 04/10/2012.  FINDINGS: Diffusely enlarged ventricles and subarachnoid spaces. Patchy white matter low density in both cerebral hemispheres. Stable old bilateral cerebellar lacunar infarcts. No intracranial hemorrhage, mass lesion or CT evidence of acute infarction. Unremarkable bones and included paranasal sinuses.  IMPRESSION: 1. No acute abnormality. 2. Stable atrophy, chronic small vessel white matter ischemic changes and old cerebellar infarcts.   Electronically Signed   By: Claudie Revering M.D.   On: 10/25/2014 15:45   Dg Chest Port 1 View  10/25/2014   CLINICAL DATA:  Pt had LOC at church this morning. Pt was  hypotensive and clammy to the touch. Hx HTN (took medication this morning) and diabetes  EXAM: PORTABLE CHEST - 1 VIEW  COMPARISON:  08/29/2013  FINDINGS: Lung volumes are relatively low. There is mild hazy opacity at the lung bases, left greater than right. This is likely mild atelectasis. There may be a small left effusion. Pneumonia is not excluded but felt less likely. There is no pulmonary edema. No pneumothorax. Cardiac silhouette is normal in size. Normal mediastinal and hilar contours.  Bony thorax is demineralized but grossly intact.  IMPRESSION: Mild lung base opacity, left greater than right, likely atelectasis. Pneumonia should be considered if there are consistent clinical symptoms. No other evidence of an acute abnormality   Electronically Signed   By: Lajean Manes M.D.   On: 10/25/2014 14:15     PERTINENT LAB RESULTS: CBC:  Recent Labs  10/25/14 1339 10/26/14 0503  WBC 6.3 7.7  HGB 11.0* 10.6*  HCT 35.5* 34.5*  PLT 234 245   CMET CMP     Component Value Date/Time   NA 137 10/27/2014 0518   K 4.4 10/27/2014 0518   CL 107 10/27/2014 0518   CO2 21* 10/27/2014 0518   GLUCOSE 87 10/27/2014 0518   BUN 20 10/27/2014 0518   CREATININE 1.52* 10/27/2014 0518   CALCIUM 8.7* 10/27/2014 0518   PROT 7.1 10/25/2014 1339   ALBUMIN 3.2* 10/25/2014 1339   AST 16 10/25/2014 1339   ALT 20 10/25/2014 1339   ALKPHOS 127* 10/25/2014 1339   BILITOT 0.7 10/25/2014 1339   GFRNONAA 43* 10/27/2014 0518   GFRAA 50* 10/27/2014 0518    GFR Estimated Creatinine Clearance: 39.6 mL/min (by C-G formula based on Cr of 1.52). No results for input(s): LIPASE, AMYLASE in the last 72 hours.  Recent Labs  10/25/14 1822 10/25/14 2321 10/26/14 0503  TROPONINI <0.03 <0.03 <0.03   Invalid input(s): POCBNP No results for input(s): DDIMER in the last 72 hours. No results for input(s): HGBA1C in the last 72 hours. No results for input(s): CHOL, HDL, LDLCALC, TRIG, CHOLHDL, LDLDIRECT in the last  72 hours. No results for input(s): TSH, T4TOTAL, T3FREE, THYROIDAB in the last 72 hours.  Invalid input(s): FREET3 No results for input(s): VITAMINB12, FOLATE, FERRITIN, TIBC, IRON, RETICCTPCT in the last 72 hours. Coags: No results for input(s): INR in the last 72 hours.  Invalid input(s): PT Microbiology: Recent Results (from the past 240 hour(s))  MRSA PCR Screening     Status: None   Collection Time: 10/25/14  5:52 PM  Result Value Ref Range Status   MRSA by PCR NEGATIVE NEGATIVE Final    Comment:        The GeneXpert MRSA Assay (FDA approved for NASAL specimens only), is one component of a comprehensive MRSA colonization surveillance program. It is not intended to diagnose MRSA infection nor to guide or monitor treatment for MRSA infections.  Culture, Urine     Status: None   Collection Time: 10/26/14  6:33 AM  Result Value Ref Range Status   Specimen Description URINE, RANDOM  Final   Special Requests NONE  Final   Colony Count NO GROWTH Performed at [facility]   Final   Culture NO GROWTH Performed at [facility]   Final   Report Status 10/27/2014 FINAL  Final     BRIEF HOSPITAL COURSE:   Principal Problem:   Syncope: Etiology uncertain, but given his renal failure suspicion of orthostatic/dehydration. However orthostatic vital signs were negative. Patient was admitted and monitored in the telemetry unit without any eventful findings. Patient is given IV fluids, following which his creatinine significantly improved. 2-D echo showed a preserved ejection fraction. Outpatient event monitor and cardiology follow-up has been arranged. Review of home meds, patient has been on SGLT2 inhibitor (empagliflozin), this has known side effect of dehydration, orthostatic hypotension and elevation of cr, empagliflozin stopped at time of discharge, pmd to decide the need of SGLT2.   Active Problems:   Hypertension: Continue amlodipine,  Avapro held during  admission due to elevation of cr, resumed at discharge with normalization of cr. Have asked patient to follow-up with PCP where repeat chemistry panel can be done.    AKI (acute kidney injury): Likely mutifactorial, prerenal azotemia from transient hypotension (apparently hypotensive in the field when EMS arrived), SGLT2. Renal function significantly improved at the time of discharge with IV fluids. Avapro resumed at discharge, pmd to repeat bmp in one week.    Suspected UTI: UA somewhat consistent with a UTI, however urine culture negative. Was treated with IV Rocephin-which will be discontinued on discharge.   History of CVA: Stable, CT of the head without any acute findings. Continue aspirin. Has mild right-sided deficits-mostly in the right lower extremity from poliomyelitis.    BPH: Continue Flomax    Diabetes type 2: Continue Levemir, actos, linagliptin, d/c empagliflozin, pmd to continue adjust diabetes meds.   TODAY-DAY OF DISCHARGE:  Subjective:   Ramsay Bognar today has no headache,no chest abdominal pain,no new weakness tingling or numbness, feels much better wants to go home today.  Objective:   Blood pressure 147/62, pulse 86, temperature 98.4 F (36.9 C), temperature source Oral, resp. rate 18, height 5\' 5"  (1.651 m), weight 77.32 kg (170 lb 7.4 oz), SpO2 98 %.  Intake/Output Summary (Last 24 hours) at 10/28/14 1133 Last data filed at 10/28/14 1052  Gross per 24 hour  Intake    360 ml  Output    600 ml  Net   -240 ml   Filed Weights   10/26/14 0514 10/27/14 0435 10/28/14 0511  Weight: 76.703 kg (169 lb 1.6 oz) 77.248 kg (170 lb 4.8 oz) 77.32 kg (170 lb 7.4 oz)    Exam Awake Alert, Oriented *3, No new F.N deficits, Normal affect [place].AT,PERRAL Supple Neck,No JVD, No cervical lymphadenopathy appriciated.  Symmetrical Chest wall movement, Good air movement bilaterally, CTAB RRR,No Gallops,Rubs or new Murmurs, No Parasternal Heave +ve B.Sounds, Abd Soft, Non tender,  No organomegaly appriciated, No rebound -guarding or rigidity. No Cyanosis, Clubbing or edema, No new Rash or bruise Extremity: chronic right lower extremity deformity/weaknees due to remote history of polio. ( walk with a walker at baseline)  DISCHARGE CONDITION: Stable  DISPOSITION: Home  DISCHARGE INSTRUCTIONS:    Activity:  As tolerated with Full fall precautions use walker/cane & assistance as needed  Diet recommendation: Diabetic Diet Heart Healthy diet  Discharge Instructions    Call MD for:    Complete by:  As directed   Syncope/loss of consciousness     Diet - low sodium heart healthy    Complete by:  As directed      Diet - low sodium heart healthy    Complete by:  As directed      Diet Carb Modified    Complete by:  As directed      Discharge instructions    Complete by:  As directed   No driving, operating heavy machinery, participating in activities at [facility] childcare unsupervised-till seen by cardiologist and cleared for such activities.     Increase activity slowly    Complete by:  As directed      Increase activity slowly    Complete by:  As directed            Follow-up Information    Follow up with [facility] On 10/29/2014.   Specialty:  Cardiology   Why:  11:30am. Pick up 30 day event monitor this [employer] information:   [address] [place] [address] [phone]      Follow up with Virl Axe, MD On 12/03/2014.   Specialty:  Cardiology   Why:  3:30pm. Outpatient cardiology consult if 30 day event monitor has abnormal result, otherwise no abnormality noted on event monitor, this may be cancelled.   Contact information:   [address] [place] [address] [phone]       Follow up with [name], MD On 11/03/2014.   Specialty:  Endocrinology   Why:  Appointment with Dr. Wilson Singer is on 11/03/14 at [facility] information:   [address] [place] [address] [phone]       Total Time spent on discharge equals 45 minutes.  Signed: Dejion Grillo 10/28/2014 11:33 AM

## 2014-10-27 NOTE — Progress Notes (Signed)
Called wife to make her aware of patient discharge, she did not answer phone, left voicemail with callback number, patient will be dc'd as soon as contact is made with wife.

## 2014-10-27 NOTE — Progress Notes (Deleted)
NURSING PROGRESS NOTE  Mike Mack [id number] Discharge Data: 10/27/2014 6:24 PM Attending Provider: Jonetta Osgood, MD ZDG:[id number] DENNIS, MD   Arn Medal to be D/C'd Home per MD order. Wife states patient does not drive a motor vehicle at all, and further acknowledges patient cannot drive a motor vehicle until his follow up appointment with the MD, if the MD approves him to do so.    All IV's will be discontinued and monitored for bleeding.  All belongings will be returned to patient for patient to take home.  Last Documented Vital Signs:  Blood pressure 142/66, pulse 79, temperature 98.4 F (36.9 C), temperature source Oral, resp. rate 16, height 5\' 5"  (1.651 m), weight 77.248 kg (170 lb 4.8 oz), SpO2 98 %.  Hendricks Limes RN, BS, BSN

## 2014-10-27 NOTE — Discharge Instructions (Signed)
No driving, operating heavy machinery, participating in activities at [facility] childcare unsupervised-till seen by cardiologist and cleared for such activities.

## 2014-10-28 DIAGNOSIS — N179 Acute kidney failure, unspecified: Principal | ICD-10-CM

## 2014-10-28 DIAGNOSIS — E119 Type 2 diabetes mellitus without complications: Secondary | ICD-10-CM

## 2014-10-28 DIAGNOSIS — Z8612 Personal history of poliomyelitis: Secondary | ICD-10-CM

## 2014-10-28 DIAGNOSIS — R55 Syncope and collapse: Secondary | ICD-10-CM

## 2014-10-28 DIAGNOSIS — I10 Essential (primary) hypertension: Secondary | ICD-10-CM

## 2014-10-28 LAB — GLUCOSE, CAPILLARY
GLUCOSE-CAPILLARY: 135 mg/dL — AB (ref 70–99)
Glucose-Capillary: 108 mg/dL — ABNORMAL HIGH (ref 70–99)

## 2014-10-28 MED ORDER — LINAGLIPTIN 5 MG PO TABS: 5.0000 mg | ORAL_TABLET | Freq: Every day | ORAL | Status: DC

## 2014-10-28 NOTE — Progress Notes (Signed)
NURSING PROGRESS NOTE  Mike Mack [id number] Discharge Data: 10/28/2014 4:40 PM Attending Provider: Florencia Reasons, MD MEB:[id number] DENNIS, MD   Arn Medal to be D/C'd Home per MD order.    All IV's will be discontinued and monitored for bleeding.  All belongings will be returned to patient for patient to take home.  Last Documented Vital Signs:  Blood pressure 154/72, pulse 86, temperature 98.9 F (37.2 C), temperature source Oral, resp. rate 16, height 5\' 5"  (1.651 m), weight 77.32 kg (170 lb 7.4 oz), SpO2 99 %.  Hendricks Limes RN, BS, BSN

## 2014-10-28 NOTE — Progress Notes (Signed)
Patient wife plans to be here at 1600 to pick up patient for discharge after she gets off work.

## 2014-10-29 ENCOUNTER — Ambulatory Visit: Payer: Commercial Managed Care - HMO

## 2014-10-29 ENCOUNTER — Encounter: Payer: Self-pay | Admitting: *Deleted

## 2014-10-29 DIAGNOSIS — R55 Syncope and collapse: Secondary | ICD-10-CM | POA: Diagnosis not present

## 2014-10-29 LAB — HEMOGLOBIN A1C
Hgb A1c MFr Bld: 9.7 % — ABNORMAL HIGH (ref 4.8–5.6)
Mean Plasma Glucose: 232 mg/dL

## 2014-10-29 NOTE — Progress Notes (Signed)
Patient ID: Mike Mack, male   DOB: 09/11/1935, 79 y.o.   MRN: [id number] [employer] verite 30 day cardiac event monitor applied to patient.

## 2014-11-03 DIAGNOSIS — E118 Type 2 diabetes mellitus with unspecified complications: Secondary | ICD-10-CM | POA: Diagnosis not present

## 2014-11-03 DIAGNOSIS — G629 Polyneuropathy, unspecified: Secondary | ICD-10-CM | POA: Diagnosis not present

## 2014-11-03 DIAGNOSIS — I10 Essential (primary) hypertension: Secondary | ICD-10-CM | POA: Diagnosis not present

## 2014-11-06 DIAGNOSIS — H4011X3 Primary open-angle glaucoma, severe stage: Secondary | ICD-10-CM | POA: Diagnosis not present

## 2014-11-25 ENCOUNTER — Telehealth: Payer: Self-pay

## 2014-11-25 NOTE — Telephone Encounter (Signed)
New Message       Pt's wife calling stating that pt is supposed to have appt w/ Dr. Caryl Comes unless the pt's monitor results are normal and wants to know if we have enough information from the monitor to determine if he needs to be seen or not. Pt is still wearing the monitor and will be sending it back this weekend. Per the appt notes pt is to have the appt unless even monitor results are normal. Please call pt's wife back and advise.

## 2014-11-25 NOTE — Telephone Encounter (Signed)
Informed patient that we do not have results yet as he has not turned in his monitor.  He understands that it will be couple weeks before results will be reviewed, and that we will call him.

## 2014-12-03 ENCOUNTER — Encounter: Payer: Self-pay | Admitting: Internal Medicine

## 2014-12-03 ENCOUNTER — Ambulatory Visit (INDEPENDENT_AMBULATORY_CARE_PROVIDER_SITE_OTHER): Payer: Commercial Managed Care - HMO | Admitting: Internal Medicine

## 2014-12-03 VITALS — BP 118/68 | HR 86 | Ht 65.0 in | Wt 170.8 lb

## 2014-12-03 DIAGNOSIS — K5901 Slow transit constipation: Secondary | ICD-10-CM | POA: Diagnosis not present

## 2014-12-03 DIAGNOSIS — E118 Type 2 diabetes mellitus with unspecified complications: Secondary | ICD-10-CM | POA: Diagnosis not present

## 2014-12-03 DIAGNOSIS — I10 Essential (primary) hypertension: Secondary | ICD-10-CM | POA: Diagnosis not present

## 2014-12-03 DIAGNOSIS — R55 Syncope and collapse: Secondary | ICD-10-CM | POA: Diagnosis not present

## 2014-12-03 DIAGNOSIS — E039 Hypothyroidism, unspecified: Secondary | ICD-10-CM | POA: Diagnosis not present

## 2014-12-03 NOTE — Progress Notes (Signed)
ELECTROPHYSIOLOGY CONSULT NOTE  Patient ID: Mike Mack, MRN: [id number], DOB/AGE: November 26, 1935 79 y.o. Admit date: (Not on file) Date of Consult: 12/03/2014  Primary Physician: [name], MD Primary Cardiologist: new  Chief Complaint:  syncope   HPI Mike Mack is a 79 y.o. male  Seen for an episode of syncope that occurred at church on Mother's Day. He has poor recollection of the event. This is a memory issue. His wife recalls that EMS said that they heart rate was low. He was hospitalized and monitored and evaluated Telemetry is unrevealing Echocardiogram 5/16 was normal Electrocardiogram 5/16 was also normal. He was given an event recorder which is also normal.  He has no antecedent history of syncope palpitations. He has no significant exercise intolerance apart from the limitations related to polio  Past Medical History  Diagnosis Date  . Hypertension   . Small bowel obstruction   . BPH (benign prostatic hyperplasia)   . Neuropathy   . Polio   . Polio Childhood  . Constipation 05/13/2013  . Anemia 05/13/2013  . Type II diabetes mellitus   . Exertional shortness of breath     "sometimes" (07/25/2013)  . History of stomach ulcers   . Stroke 2014    residual:  "left hand kind of numb" (07/25/2013)  . Arthritis     "right leg" (07/25/2013)      Surgical History:  Past Surgical History  Procedure Laterality Date  . Bowel resection    . Cataract extraction w/ intraocular lens  implant, bilateral Bilateral 2000's  . Colonoscopy      Hx: of  . Laparoscopic cholecystectomy  07/25/2013    w/LOA (07/25/2013)  . Excisional hemorrhoidectomy  2000's  . Colon surgery    . Inguinal hernia repair Right 1970's  . Cholecystectomy N/A 07/25/2013    Procedure: LAPAROSCOPIC CHOLECYSTECTOMY WITH INTRAOPERATIVE CHOLANGIOGRAM;  Surgeon: [name], MD;  Location: [facility];  Service: General;  Laterality: N/A;     Home Meds: Prior to Admission medications   Medication  Sig Start Date End Date Taking? Authorizing Provider  amLODipine (NORVASC) 5 MG tablet Take 1 tablet by mouth daily. 11/05/13  Yes Historical Provider, MD  aspirin 325 MG tablet Take 325 mg by mouth daily.   Yes Historical Provider, MD  atorvastatin (LIPITOR) 20 MG tablet Take 1 tablet by mouth every evening. 11/03/13  Yes Historical Provider, MD  COMBIGAN 0.2-0.5 % ophthalmic solution Place 1 drop into both eyes 2 (two) times daily. 09/21/14  Yes Historical Provider, MD  docusate sodium (COLACE) 100 MG capsule Take 1 capsule (100 mg total) by mouth every 12 (twelve) hours. 11/04/13  Yes Antonietta Breach, PA-C  gabapentin (NEURONTIN) 300 MG capsule 2 EVERY MORNING & 2 AT NIGHT. IF NOT TOO SLEEPY GO TO 2 IN THE AM & 3 AT NIGHT AFTER A FEW DAYS 10/07/14  Yes Historical Provider, MD  HYDROcodone-acetaminophen (NORCO/VICODIN) 5-325 MG per tablet Take 1 tablet by mouth every 6 (six) hours. 10/05/14  Yes Historical Provider, MD  insulin detemir (LEVEMIR) 100 UNIT/ML injection Inject 10-20 Units into the skin 2 (two) times daily. 10 units in the morning, 20 units in the evening   Yes Historical Provider, MD  irbesartan (AVAPRO) 300 MG tablet Take 300 mg by mouth daily. 10/07/14  Yes Historical Provider, MD  latanoprost (XALATAN) 0.005 % ophthalmic solution  11/17/14  Yes Historical Provider, MD  linagliptin (TRADJENTA) 5 MG TABS tablet Take 1 tablet (5 mg total) by mouth daily. 10/28/14  Yes Florencia Reasons, MD  Multiple Vitamin (MULTIVITAMIN WITH MINERALS) TABS tablet Take 1 tablet by mouth daily. 09/06/13  Yes [name], MD  pioglitazone (ACTOS) 30 MG tablet Take 30 mg by mouth daily. 09/19/14  Yes Historical Provider, MD  polyethylene glycol (MIRALAX / GLYCOLAX) packet Take 17 g by mouth daily. 09/06/13  Yes [name], MD  Tamsulosin HCl (FLOMAX) 0.4 MG CAPS Take 0.4 mg by mouth 2 (two) times daily.    Yes Historical Provider, MD  Travoprost, BAK Free, (TRAVATAN) 0.004 % SOLN ophthalmic solution Place 1 drop into  both eyes at bedtime.   Yes Historical Provider, MD  VESICARE 5 MG tablet Take 5 mg by mouth daily. 11/06/14  Yes Historical Provider, MD      Allergies: No Known Allergies  History   Social History  . Marital Status: Married    Spouse Name: Santhosh Gulino  . Number of Children: 2  . Years of Education: N/A   Occupational History  . Retired, Curator    Social History Main Topics  . Smoking status: Never Smoker   . Smokeless tobacco: Never Used  . Alcohol Use: No  . Drug Use: No  . Sexual Activity: Yes   Other Topics Concern  . Not on file   Social History Narrative   Married.  Lives in [place] with wife.  Ambulates with a cane.     Family History  Problem Relation Age of Onset  . Diabetes Sister   . Diabetes Sister   . Diabetes Brother   . Hypertension Sister   . Hypertension Sister   . Hypertension Brother   . Heart failure Mother   . Heart attack Sister      ROS:  Please see the history of present illness.     All other systems reviewed and negative.    Physical Exam:  Blood pressure 118/68, pulse 86, height 5\' 5"  (1.651 m), weight 170 lb 12.8 oz (77.474 kg), SpO2 97 %. General: Well developed, well nourished male in no acute distress. Head: Normocephalic, atraumatic, sclera non-icteric, no xanthomas, nares are without discharge. EENT: normal Lymph Nodes:  none Back: without scoliosis/kyphosis* no CVA tendersness Neck: Negative for carotid bruits. JVD not elevated. Lungs: Clear bilaterally to auscultation without wheezes, rales, or rhonchi. Breathing is unlabored. Heart: RRR with S1 S2.  26 systolic murmur , rubs, or gallops appreciated. Abdomen: Soft, non-tender, non-distended with normoactive bowel sounds. No hepatomegaly. No rebound/guarding. No obvious abdominal masses. Msk:  Strength and tone appear normal for age. Extremities: No clubbing or cyanosis.  No edema.  Distal pedal pulses are 2+ and equal bilaterally. Skin: Warm and Dry Neuro: Alert  and oriented X 3. CN III-XII intact Grossly normal sensory and motor function . Psych:  Responds to questions appropriately with a normal affect.      Labs: Cardiac Enzymes No results for input(s): CKTOTAL, CKMB, TROPONINI in the last 72 hours. CBC Lab Results  Component Value Date   WBC 7.7 10/26/2014   HGB 10.6* 10/26/2014   HCT 34.5* 10/26/2014   MCV 85.6 10/26/2014   PLT 245 10/26/2014   PROTIME: No results for input(s): LABPROT, INR in the last 72 hours. Chemistry No results for input(s): NA, K, CL, CO2, BUN, CREATININE, CALCIUM, PROT, BILITOT, ALKPHOS, ALT, AST, GLUCOSE in the last 168 hours.  Invalid input(s): LABALBU Lipids Lab Results  Component Value Date   CHOL 157 04/11/2012   HDL 46 04/11/2012   LDLCALC 93 04/11/2012  TRIG 89 04/11/2012   BNP No results found for: PROBNP Thyroid Function Tests: No results for input(s): TSH, T4TOTAL, T3FREE, THYROIDAB in the last 72 hours.  Invalid input(s): FREET3    Miscellaneous No results found for: DDIMER  Radiology/Studies:  No results found.  EKG:  Normal   Assessment and Plan:  Syncope  pts evaluation failed to detect any significant underlying structural heart disease. The statistical likelihood then is low that syncope is a harbinger of dying. Syncope however recurs and all coworkers about 20-30% of patients over the next year. He unfortunately has poor memory and  Will be probably unable to identify a prodrome that might trigger him to lie down.  This almost certainly was a vasomotor episode as suggested by the low heart rate reported to be true from EMS; these results are still awaited. In the context of hypertension and diabetes vasomotor instability is also certainly quite likely  We will be glad to see him again in the event of recurrent syncope.  Virl Axe

## 2014-12-31 DIAGNOSIS — H4720 Unspecified optic atrophy: Secondary | ICD-10-CM | POA: Diagnosis not present

## 2014-12-31 DIAGNOSIS — H462 Nutritional optic neuropathy: Secondary | ICD-10-CM | POA: Diagnosis not present

## 2014-12-31 DIAGNOSIS — E11359 Type 2 diabetes mellitus with proliferative diabetic retinopathy without macular edema: Secondary | ICD-10-CM | POA: Diagnosis not present

## 2015-01-29 DIAGNOSIS — E118 Type 2 diabetes mellitus with unspecified complications: Secondary | ICD-10-CM | POA: Diagnosis not present

## 2015-02-04 DIAGNOSIS — R609 Edema, unspecified: Secondary | ICD-10-CM | POA: Diagnosis not present

## 2015-02-04 DIAGNOSIS — E118 Type 2 diabetes mellitus with unspecified complications: Secondary | ICD-10-CM | POA: Diagnosis not present

## 2015-02-04 DIAGNOSIS — I10 Essential (primary) hypertension: Secondary | ICD-10-CM | POA: Diagnosis not present

## 2015-02-04 DIAGNOSIS — G629 Polyneuropathy, unspecified: Secondary | ICD-10-CM | POA: Diagnosis not present

## 2015-03-05 DIAGNOSIS — H4011X3 Primary open-angle glaucoma, severe stage: Secondary | ICD-10-CM | POA: Diagnosis not present

## 2015-03-17 DIAGNOSIS — I10 Essential (primary) hypertension: Secondary | ICD-10-CM | POA: Diagnosis not present

## 2015-03-17 DIAGNOSIS — R05 Cough: Secondary | ICD-10-CM | POA: Diagnosis not present

## 2015-03-17 DIAGNOSIS — E118 Type 2 diabetes mellitus with unspecified complications: Secondary | ICD-10-CM | POA: Diagnosis not present

## 2015-04-02 DIAGNOSIS — N3941 Urge incontinence: Secondary | ICD-10-CM | POA: Diagnosis not present

## 2015-04-06 DIAGNOSIS — R209 Unspecified disturbances of skin sensation: Secondary | ICD-10-CM | POA: Diagnosis not present

## 2015-04-30 DIAGNOSIS — E118 Type 2 diabetes mellitus with unspecified complications: Secondary | ICD-10-CM | POA: Diagnosis not present

## 2015-04-30 DIAGNOSIS — E789 Disorder of lipoprotein metabolism, unspecified: Secondary | ICD-10-CM | POA: Diagnosis not present

## 2015-04-30 DIAGNOSIS — I10 Essential (primary) hypertension: Secondary | ICD-10-CM | POA: Diagnosis not present

## 2015-05-06 DIAGNOSIS — E118 Type 2 diabetes mellitus with unspecified complications: Secondary | ICD-10-CM | POA: Diagnosis not present

## 2015-05-06 DIAGNOSIS — E789 Disorder of lipoprotein metabolism, unspecified: Secondary | ICD-10-CM | POA: Diagnosis not present

## 2015-05-06 DIAGNOSIS — I639 Cerebral infarction, unspecified: Secondary | ICD-10-CM | POA: Diagnosis not present

## 2015-05-07 DIAGNOSIS — E1351 Other specified diabetes mellitus with diabetic peripheral angiopathy without gangrene: Secondary | ICD-10-CM | POA: Diagnosis not present

## 2015-05-07 DIAGNOSIS — I70293 Other atherosclerosis of native arteries of extremities, bilateral legs: Secondary | ICD-10-CM | POA: Diagnosis not present

## 2015-05-07 DIAGNOSIS — L602 Onychogryphosis: Secondary | ICD-10-CM | POA: Diagnosis not present

## 2015-06-04 DIAGNOSIS — Z961 Presence of intraocular lens: Secondary | ICD-10-CM | POA: Diagnosis not present

## 2015-06-04 DIAGNOSIS — H401133 Primary open-angle glaucoma, bilateral, severe stage: Secondary | ICD-10-CM | POA: Diagnosis not present

## 2015-06-04 DIAGNOSIS — H04123 Dry eye syndrome of bilateral lacrimal glands: Secondary | ICD-10-CM | POA: Diagnosis not present

## 2015-06-04 DIAGNOSIS — E103593 Type 1 diabetes mellitus with proliferative diabetic retinopathy without macular edema, bilateral: Secondary | ICD-10-CM | POA: Diagnosis not present

## 2015-07-02 DIAGNOSIS — G43009 Migraine without aura, not intractable, without status migrainosus: Secondary | ICD-10-CM | POA: Diagnosis not present

## 2015-07-02 DIAGNOSIS — M542 Cervicalgia: Secondary | ICD-10-CM | POA: Diagnosis not present

## 2015-07-09 DIAGNOSIS — E789 Disorder of lipoprotein metabolism, unspecified: Secondary | ICD-10-CM | POA: Diagnosis not present

## 2015-07-09 DIAGNOSIS — E118 Type 2 diabetes mellitus with unspecified complications: Secondary | ICD-10-CM | POA: Diagnosis not present

## 2015-07-09 DIAGNOSIS — I10 Essential (primary) hypertension: Secondary | ICD-10-CM | POA: Diagnosis not present

## 2015-07-09 DIAGNOSIS — Z0000 Encounter for general adult medical examination without abnormal findings: Secondary | ICD-10-CM | POA: Diagnosis not present

## 2015-07-15 DIAGNOSIS — I10 Essential (primary) hypertension: Secondary | ICD-10-CM | POA: Diagnosis not present

## 2015-07-15 DIAGNOSIS — E032 Hypothyroidism due to medicaments and other exogenous substances: Secondary | ICD-10-CM | POA: Diagnosis not present

## 2015-07-15 DIAGNOSIS — E789 Disorder of lipoprotein metabolism, unspecified: Secondary | ICD-10-CM | POA: Diagnosis not present

## 2015-07-15 DIAGNOSIS — E118 Type 2 diabetes mellitus with unspecified complications: Secondary | ICD-10-CM | POA: Diagnosis not present

## 2015-07-22 DIAGNOSIS — H462 Nutritional optic neuropathy: Secondary | ICD-10-CM | POA: Diagnosis not present

## 2015-07-22 DIAGNOSIS — H53013 Deprivation amblyopia, bilateral: Secondary | ICD-10-CM | POA: Diagnosis not present

## 2015-07-22 DIAGNOSIS — H4720 Unspecified optic atrophy: Secondary | ICD-10-CM | POA: Diagnosis not present

## 2015-07-22 DIAGNOSIS — E113553 Type 2 diabetes mellitus with stable proliferative diabetic retinopathy, bilateral: Secondary | ICD-10-CM | POA: Diagnosis not present

## 2015-08-13 DIAGNOSIS — E1351 Other specified diabetes mellitus with diabetic peripheral angiopathy without gangrene: Secondary | ICD-10-CM | POA: Diagnosis not present

## 2015-08-13 DIAGNOSIS — L602 Onychogryphosis: Secondary | ICD-10-CM | POA: Diagnosis not present

## 2015-08-13 DIAGNOSIS — L84 Corns and callosities: Secondary | ICD-10-CM | POA: Diagnosis not present

## 2015-09-21 IMAGING — CR DG CHEST 2V
2 series · 2 of 2 positions shown · non-contrast
Comparison: None.

CLINICAL DATA: Hypertension.

EXAM:
CHEST  2 VIEW

[w chest pa]
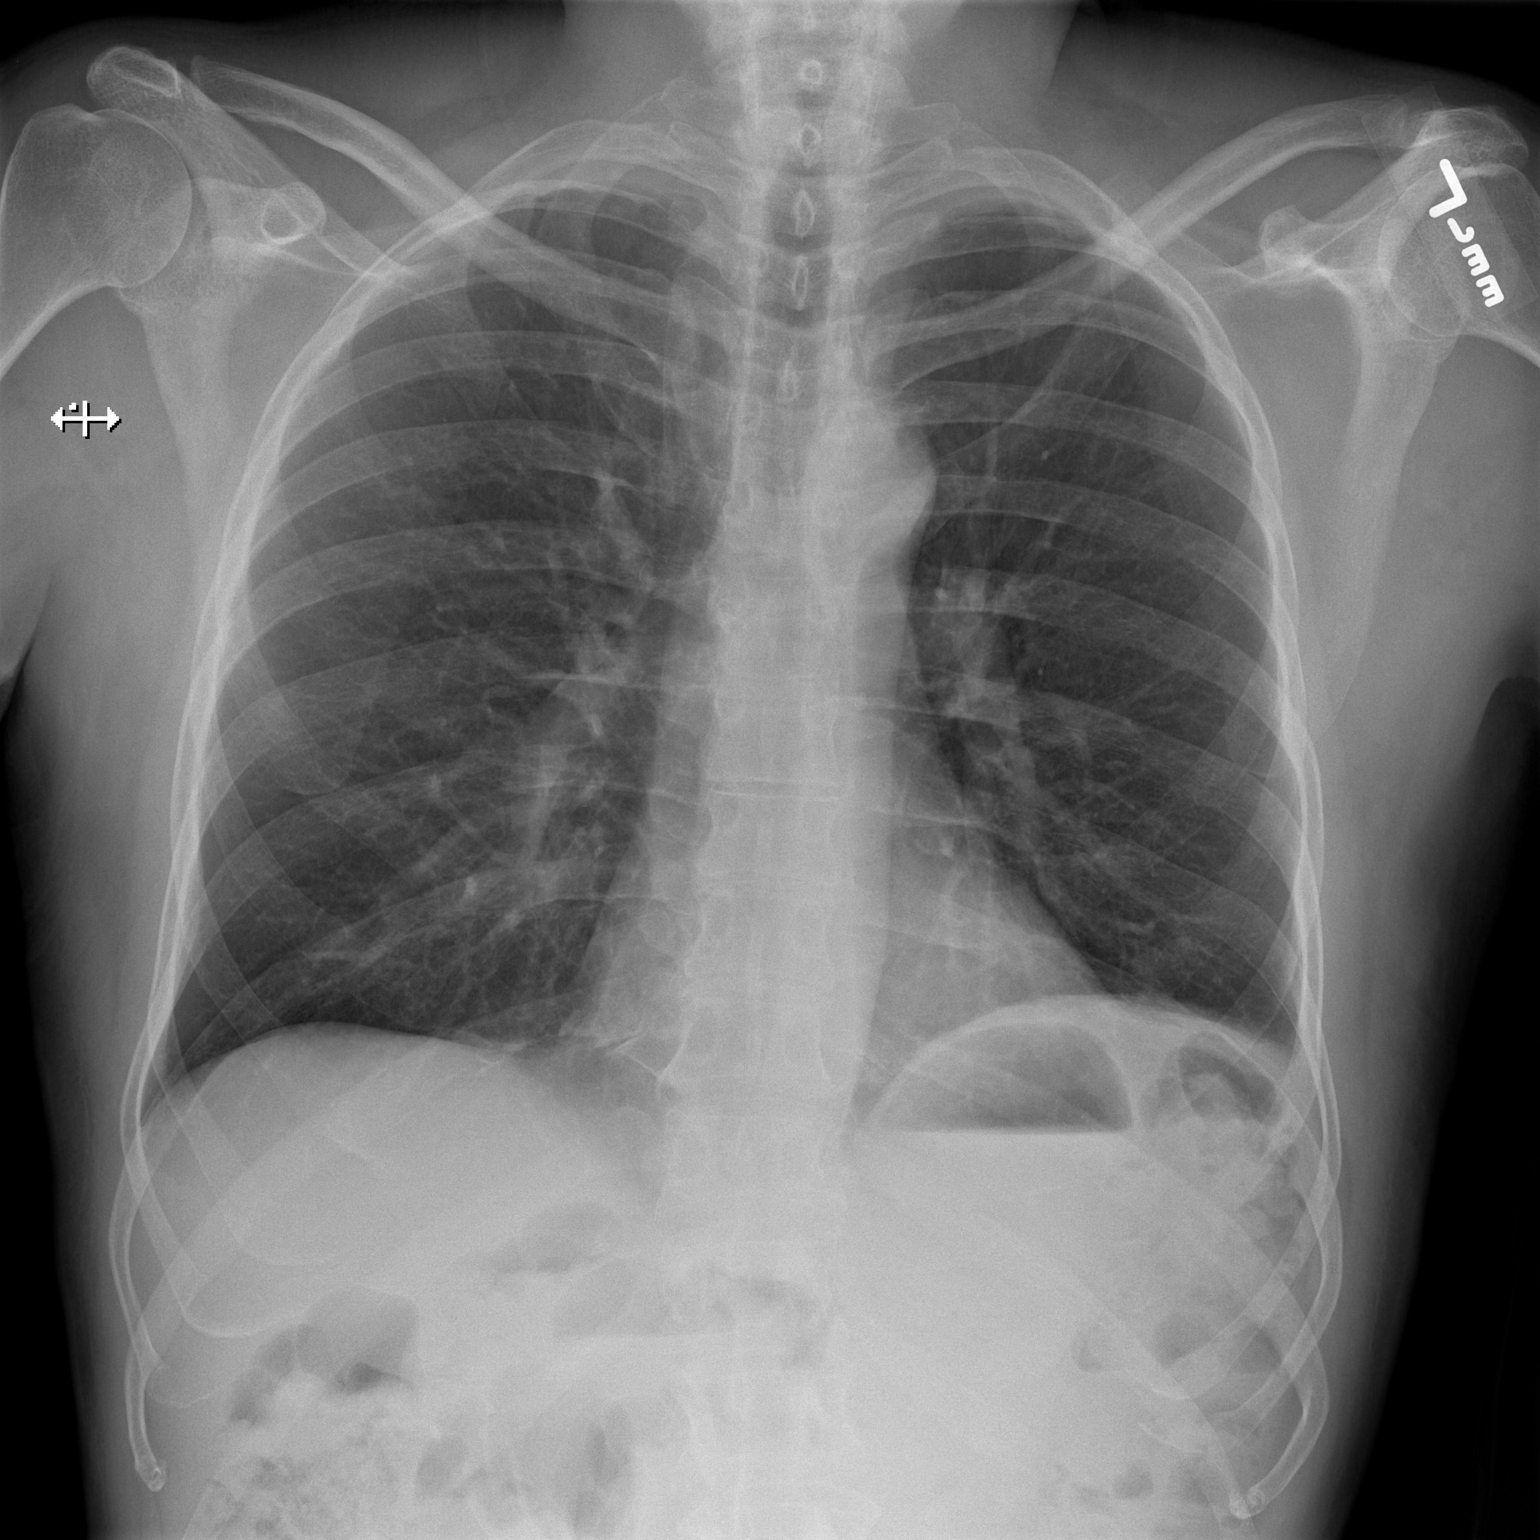

[w chest lat]
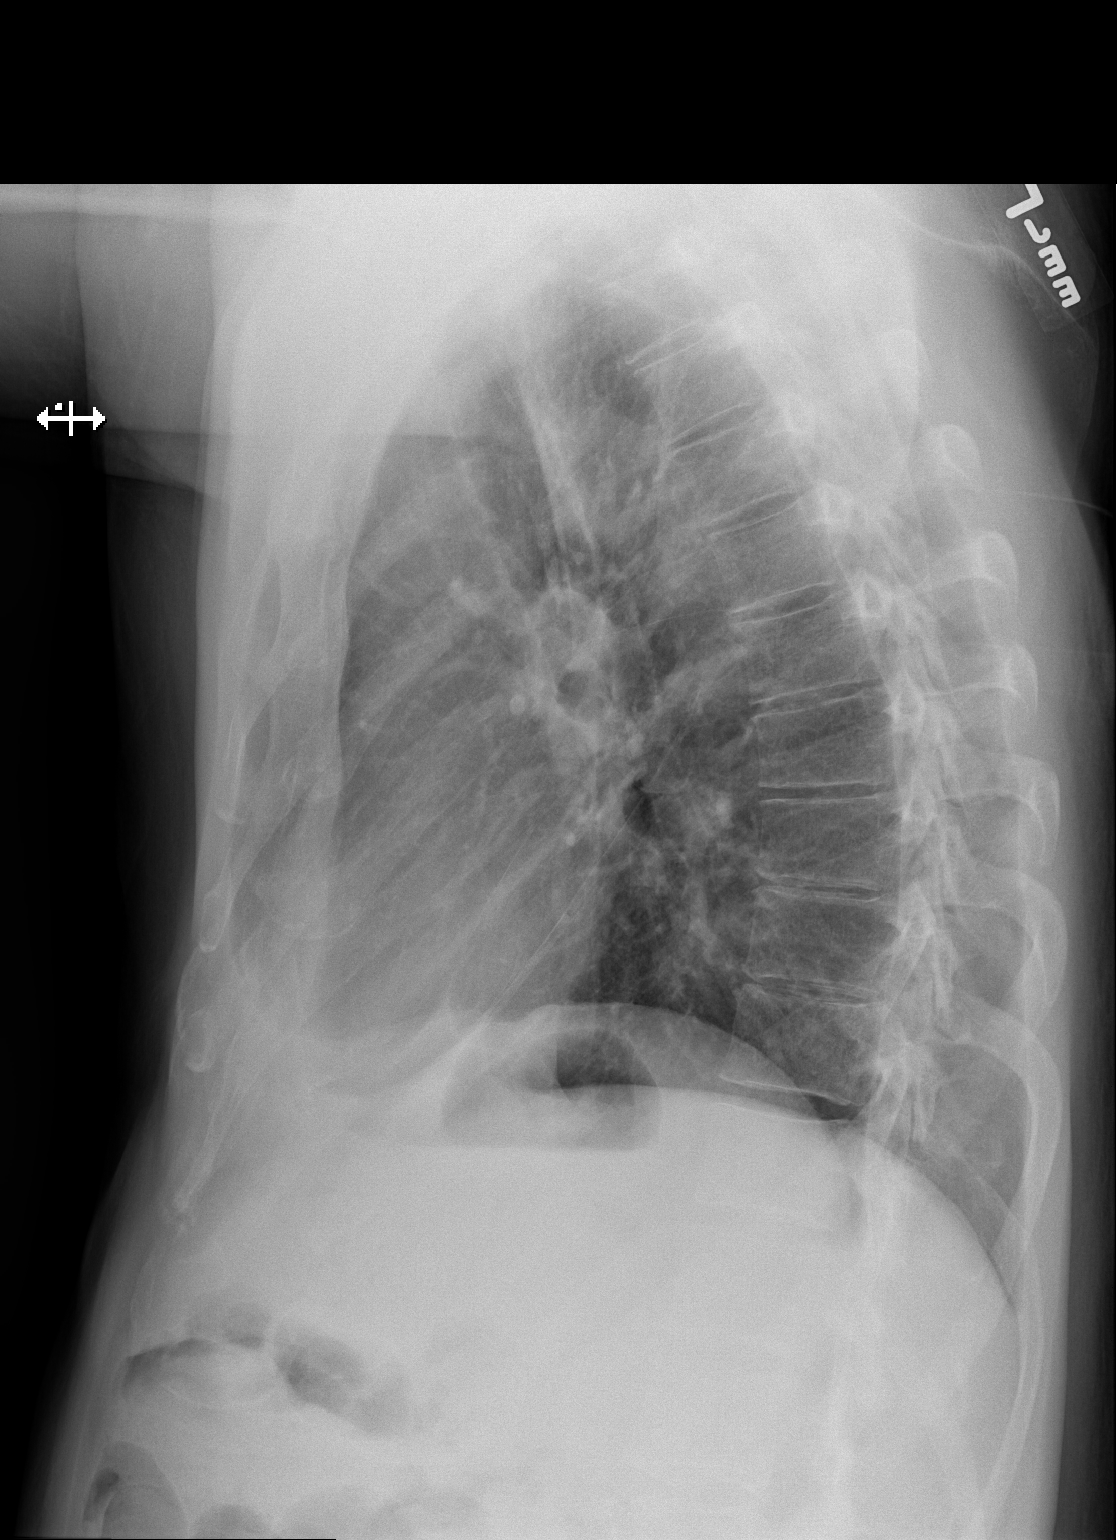

[2 of 2 positions shown; findings below may reference images not displayed]

FINDINGS: Mediastinum and hilar structures are normal. The lungs are clear.
Heart size normal. No pleural effusion or pneumothorax. Mild
elevation left hemidiaphragm noted. Degenerative changes thoracic
spine.
IMPRESSION: No active cardiopulmonary disease.

## 2015-09-22 IMAGING — RF DG CHOLANGIOGRAM OPERATIVE
1 series · 11 of 11 positions shown · non-contrast
Comparison: Percutaneous cholecystostomy tube placement 05/13/2013.

CLINICAL DATA: History of acute cholecystitis with percutaneous
cholecystostomy tube placement on 05/13/2013. Cholecystostomy tube
be came inadvertently displaced. Patient is now a better surgical
candidate and interval cholecystectomy and intraoperative
cholangiogram are performed.

EXAM:
INTRAOPERATIVE CHOLANGIOGRAM
TECHNIQUE: Cholangiographic images from the C-arm fluoroscopic device were
submitted for interpretation post-operatively. Please see the
procedural report for the amount of contrast and the fluoroscopy
time utilized.

[Series 1: run · 4 acquisitions, 11 frames shown]
[im 1/4]
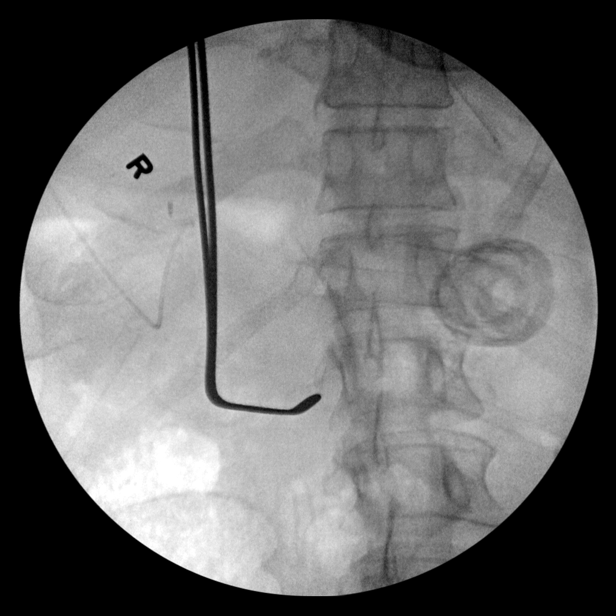
[im 1/4]
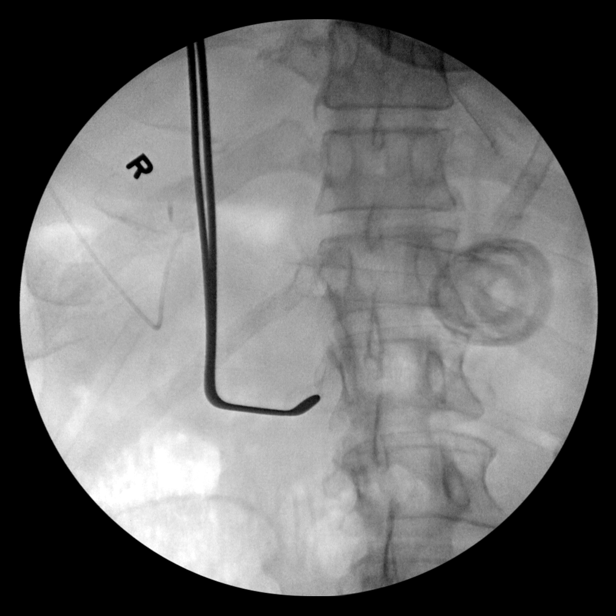
[im 1/4]
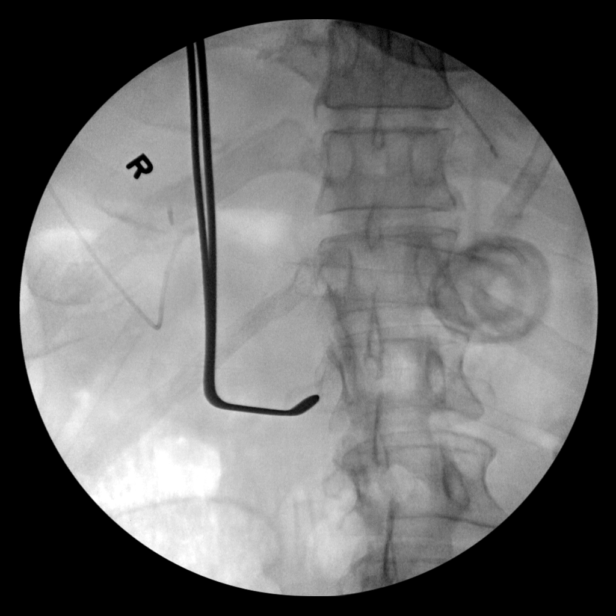
[im 2/4]
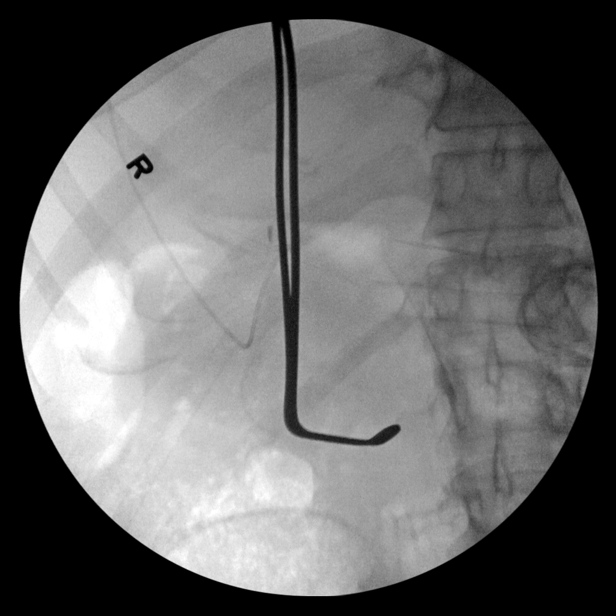
[im 2/4]
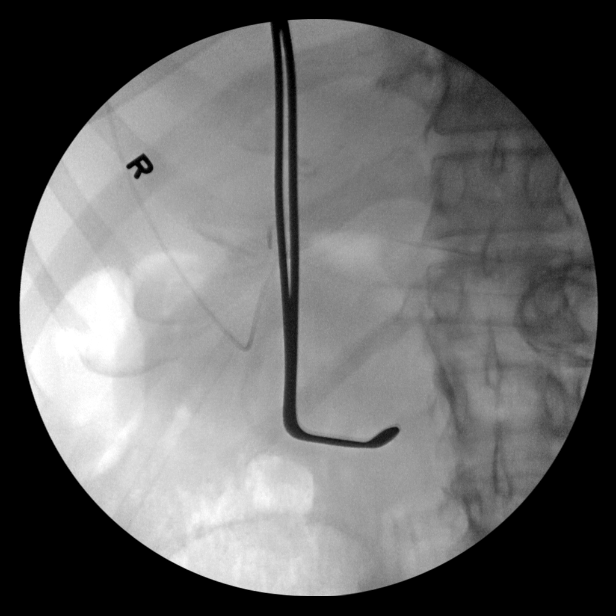
[im 2/4]
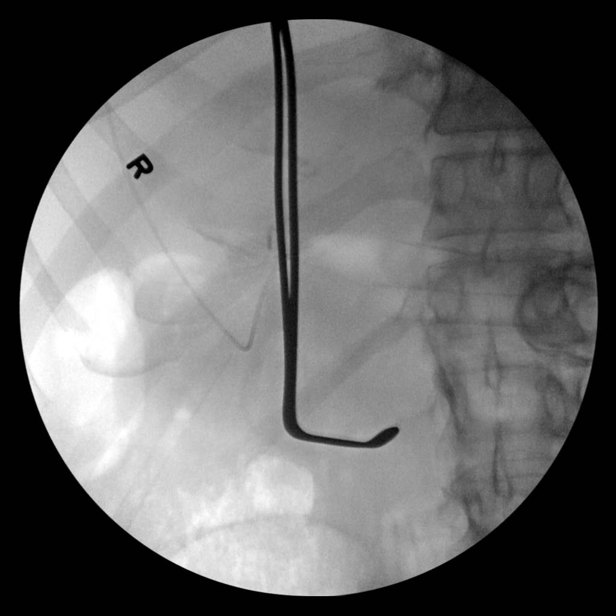
[im 3/4]
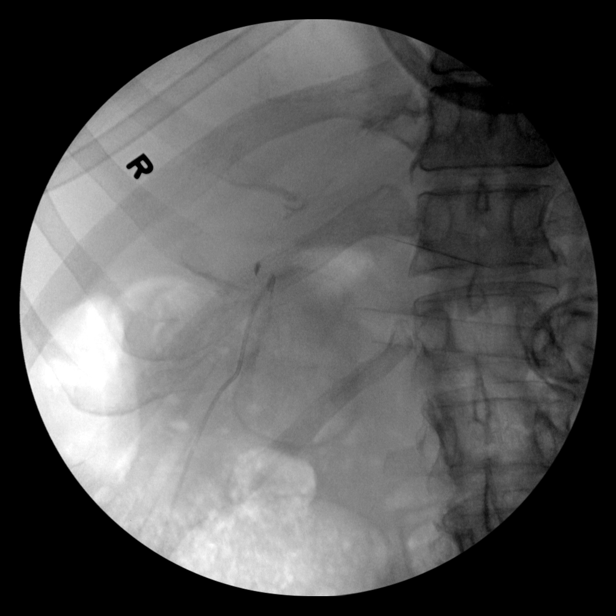
[im 3/4]
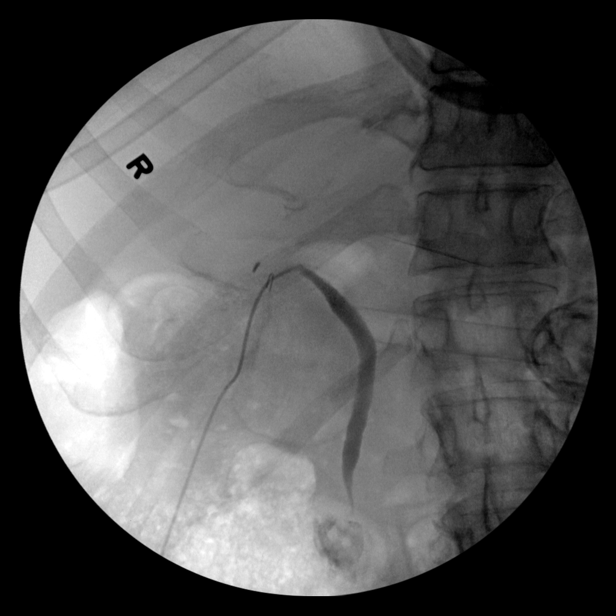
[im 3/4]
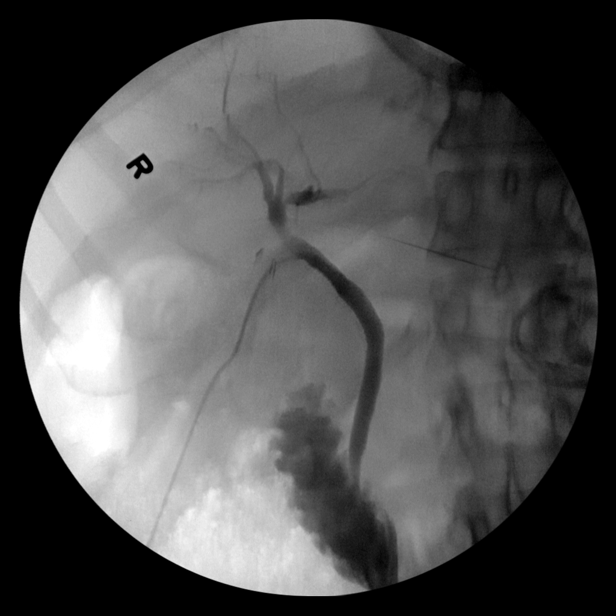
[im 3/4]
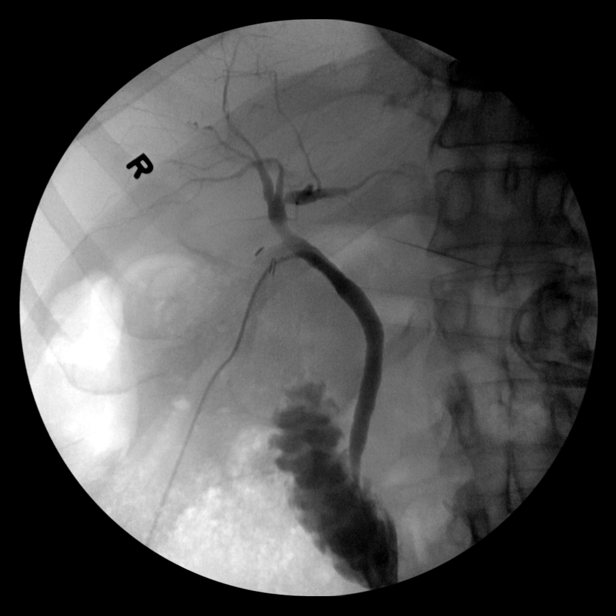
[im 4/4]
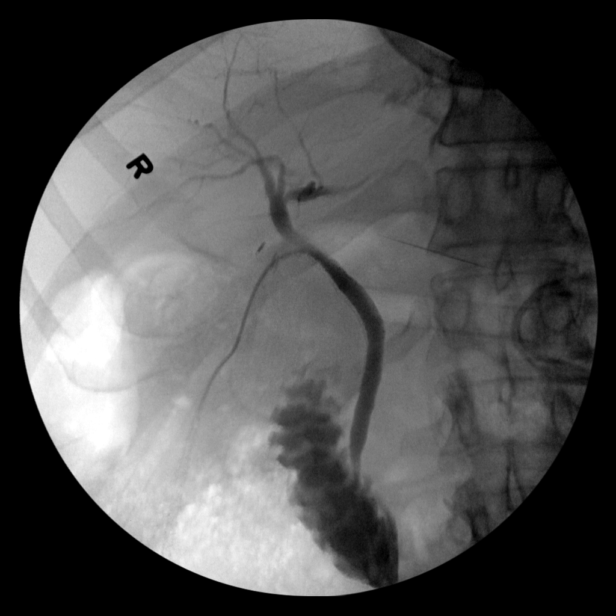

[11 of 11 positions shown; findings below may reference images not displayed]

FINDINGS: Intraoperative spot images obtained during laparoscopic
cholecystectomy demonstrate cannulation of the cystic duct stump and
opacifications of the intra and extrahepatic biliary tree. There is
no evidence of biliary ductal dilatation, stenosis, or filling
defect. Contrast material flows freely through the ampulla and into
the duodenum.
IMPRESSION: Negative intraoperative cholangiogram.

## 2015-09-23 DIAGNOSIS — I10 Essential (primary) hypertension: Secondary | ICD-10-CM | POA: Diagnosis not present

## 2015-09-23 DIAGNOSIS — E118 Type 2 diabetes mellitus with unspecified complications: Secondary | ICD-10-CM | POA: Diagnosis not present

## 2015-09-23 DIAGNOSIS — E789 Disorder of lipoprotein metabolism, unspecified: Secondary | ICD-10-CM | POA: Diagnosis not present

## 2015-09-30 DIAGNOSIS — N390 Urinary tract infection, site not specified: Secondary | ICD-10-CM | POA: Diagnosis not present

## 2015-09-30 DIAGNOSIS — E039 Hypothyroidism, unspecified: Secondary | ICD-10-CM | POA: Diagnosis not present

## 2015-09-30 DIAGNOSIS — E118 Type 2 diabetes mellitus with unspecified complications: Secondary | ICD-10-CM | POA: Diagnosis not present

## 2015-09-30 DIAGNOSIS — E789 Disorder of lipoprotein metabolism, unspecified: Secondary | ICD-10-CM | POA: Diagnosis not present

## 2015-09-30 DIAGNOSIS — I10 Essential (primary) hypertension: Secondary | ICD-10-CM | POA: Diagnosis not present

## 2015-10-01 DIAGNOSIS — H401133 Primary open-angle glaucoma, bilateral, severe stage: Secondary | ICD-10-CM | POA: Diagnosis not present

## 2015-10-01 DIAGNOSIS — H47293 Other optic atrophy, bilateral: Secondary | ICD-10-CM | POA: Diagnosis not present

## 2015-10-05 DIAGNOSIS — R209 Unspecified disturbances of skin sensation: Secondary | ICD-10-CM | POA: Diagnosis not present

## 2015-10-05 DIAGNOSIS — L299 Pruritus, unspecified: Secondary | ICD-10-CM | POA: Diagnosis not present

## 2015-10-29 DIAGNOSIS — Z0000 Encounter for general adult medical examination without abnormal findings: Secondary | ICD-10-CM | POA: Diagnosis not present

## 2015-10-29 DIAGNOSIS — N138 Other obstructive and reflux uropathy: Secondary | ICD-10-CM | POA: Diagnosis not present

## 2015-10-29 DIAGNOSIS — N401 Enlarged prostate with lower urinary tract symptoms: Secondary | ICD-10-CM | POA: Diagnosis not present

## 2015-10-29 DIAGNOSIS — N3941 Urge incontinence: Secondary | ICD-10-CM | POA: Diagnosis not present

## 2015-11-05 DIAGNOSIS — E1351 Other specified diabetes mellitus with diabetic peripheral angiopathy without gangrene: Secondary | ICD-10-CM | POA: Diagnosis not present

## 2015-11-05 DIAGNOSIS — L602 Onychogryphosis: Secondary | ICD-10-CM | POA: Diagnosis not present

## 2015-11-10 DIAGNOSIS — I10 Essential (primary) hypertension: Secondary | ICD-10-CM | POA: Diagnosis not present

## 2015-11-10 DIAGNOSIS — R05 Cough: Secondary | ICD-10-CM | POA: Diagnosis not present

## 2015-11-10 DIAGNOSIS — E118 Type 2 diabetes mellitus with unspecified complications: Secondary | ICD-10-CM | POA: Diagnosis not present

## 2015-11-25 DIAGNOSIS — L299 Pruritus, unspecified: Secondary | ICD-10-CM | POA: Diagnosis not present

## 2016-01-20 DIAGNOSIS — H401132 Primary open-angle glaucoma, bilateral, moderate stage: Secondary | ICD-10-CM | POA: Diagnosis not present

## 2016-01-20 DIAGNOSIS — E113553 Type 2 diabetes mellitus with stable proliferative diabetic retinopathy, bilateral: Secondary | ICD-10-CM | POA: Diagnosis not present

## 2016-01-20 DIAGNOSIS — H53013 Deprivation amblyopia, bilateral: Secondary | ICD-10-CM | POA: Diagnosis not present

## 2016-01-20 DIAGNOSIS — H462 Nutritional optic neuropathy: Secondary | ICD-10-CM | POA: Diagnosis not present

## 2016-01-20 DIAGNOSIS — H4720 Unspecified optic atrophy: Secondary | ICD-10-CM | POA: Diagnosis not present

## 2016-01-28 DIAGNOSIS — E789 Disorder of lipoprotein metabolism, unspecified: Secondary | ICD-10-CM | POA: Diagnosis not present

## 2016-01-28 DIAGNOSIS — E118 Type 2 diabetes mellitus with unspecified complications: Secondary | ICD-10-CM | POA: Diagnosis not present

## 2016-01-28 DIAGNOSIS — I10 Essential (primary) hypertension: Secondary | ICD-10-CM | POA: Diagnosis not present

## 2016-02-10 DIAGNOSIS — L309 Dermatitis, unspecified: Secondary | ICD-10-CM | POA: Diagnosis not present

## 2016-02-10 DIAGNOSIS — E118 Type 2 diabetes mellitus with unspecified complications: Secondary | ICD-10-CM | POA: Diagnosis not present

## 2016-02-10 DIAGNOSIS — I10 Essential (primary) hypertension: Secondary | ICD-10-CM | POA: Diagnosis not present

## 2016-02-25 DIAGNOSIS — H401113 Primary open-angle glaucoma, right eye, severe stage: Secondary | ICD-10-CM | POA: Diagnosis not present

## 2016-02-25 DIAGNOSIS — H401123 Primary open-angle glaucoma, left eye, severe stage: Secondary | ICD-10-CM | POA: Diagnosis not present

## 2016-02-25 DIAGNOSIS — E103593 Type 1 diabetes mellitus with proliferative diabetic retinopathy without macular edema, bilateral: Secondary | ICD-10-CM | POA: Diagnosis not present

## 2016-02-25 DIAGNOSIS — E1165 Type 2 diabetes mellitus with hyperglycemia: Secondary | ICD-10-CM | POA: Diagnosis not present

## 2016-03-02 DIAGNOSIS — L602 Onychogryphosis: Secondary | ICD-10-CM | POA: Diagnosis not present

## 2016-03-02 DIAGNOSIS — E1351 Other specified diabetes mellitus with diabetic peripheral angiopathy without gangrene: Secondary | ICD-10-CM | POA: Diagnosis not present

## 2016-04-11 DIAGNOSIS — R208 Other disturbances of skin sensation: Secondary | ICD-10-CM | POA: Diagnosis not present

## 2016-06-09 DIAGNOSIS — E118 Type 2 diabetes mellitus with unspecified complications: Secondary | ICD-10-CM | POA: Diagnosis not present

## 2016-06-09 DIAGNOSIS — I10 Essential (primary) hypertension: Secondary | ICD-10-CM | POA: Diagnosis not present

## 2016-06-15 DIAGNOSIS — L602 Onychogryphosis: Secondary | ICD-10-CM | POA: Diagnosis not present

## 2016-06-15 DIAGNOSIS — Z23 Encounter for immunization: Secondary | ICD-10-CM | POA: Diagnosis not present

## 2016-06-15 DIAGNOSIS — E1351 Other specified diabetes mellitus with diabetic peripheral angiopathy without gangrene: Secondary | ICD-10-CM | POA: Diagnosis not present

## 2016-06-15 DIAGNOSIS — E032 Hypothyroidism due to medicaments and other exogenous substances: Secondary | ICD-10-CM | POA: Diagnosis not present

## 2016-06-15 DIAGNOSIS — E118 Type 2 diabetes mellitus with unspecified complications: Secondary | ICD-10-CM | POA: Diagnosis not present

## 2016-06-15 DIAGNOSIS — I10 Essential (primary) hypertension: Secondary | ICD-10-CM | POA: Diagnosis not present

## 2016-07-24 DIAGNOSIS — R209 Unspecified disturbances of skin sensation: Secondary | ICD-10-CM | POA: Diagnosis not present

## 2016-07-24 DIAGNOSIS — L299 Pruritus, unspecified: Secondary | ICD-10-CM | POA: Diagnosis not present

## 2016-08-03 DIAGNOSIS — H401132 Primary open-angle glaucoma, bilateral, moderate stage: Secondary | ICD-10-CM | POA: Diagnosis not present

## 2016-08-03 DIAGNOSIS — H4720 Unspecified optic atrophy: Secondary | ICD-10-CM | POA: Diagnosis not present

## 2016-08-03 DIAGNOSIS — E113553 Type 2 diabetes mellitus with stable proliferative diabetic retinopathy, bilateral: Secondary | ICD-10-CM | POA: Diagnosis not present

## 2016-08-03 DIAGNOSIS — H43812 Vitreous degeneration, left eye: Secondary | ICD-10-CM | POA: Diagnosis not present

## 2016-08-03 DIAGNOSIS — H462 Nutritional optic neuropathy: Secondary | ICD-10-CM | POA: Diagnosis not present

## 2016-09-01 DIAGNOSIS — H04123 Dry eye syndrome of bilateral lacrimal glands: Secondary | ICD-10-CM | POA: Diagnosis not present

## 2016-09-01 DIAGNOSIS — H401133 Primary open-angle glaucoma, bilateral, severe stage: Secondary | ICD-10-CM | POA: Diagnosis not present

## 2016-09-08 DIAGNOSIS — E789 Disorder of lipoprotein metabolism, unspecified: Secondary | ICD-10-CM | POA: Diagnosis not present

## 2016-09-08 DIAGNOSIS — N390 Urinary tract infection, site not specified: Secondary | ICD-10-CM | POA: Diagnosis not present

## 2016-09-08 DIAGNOSIS — I10 Essential (primary) hypertension: Secondary | ICD-10-CM | POA: Diagnosis not present

## 2016-09-08 DIAGNOSIS — E118 Type 2 diabetes mellitus with unspecified complications: Secondary | ICD-10-CM | POA: Diagnosis not present

## 2016-09-08 DIAGNOSIS — Z125 Encounter for screening for malignant neoplasm of prostate: Secondary | ICD-10-CM | POA: Diagnosis not present

## 2016-09-14 DIAGNOSIS — E1351 Other specified diabetes mellitus with diabetic peripheral angiopathy without gangrene: Secondary | ICD-10-CM | POA: Diagnosis not present

## 2016-09-14 DIAGNOSIS — L602 Onychogryphosis: Secondary | ICD-10-CM | POA: Diagnosis not present

## 2016-09-21 DIAGNOSIS — E118 Type 2 diabetes mellitus with unspecified complications: Secondary | ICD-10-CM | POA: Diagnosis not present

## 2016-09-21 DIAGNOSIS — E789 Disorder of lipoprotein metabolism, unspecified: Secondary | ICD-10-CM | POA: Diagnosis not present

## 2016-09-21 DIAGNOSIS — L309 Dermatitis, unspecified: Secondary | ICD-10-CM | POA: Diagnosis not present

## 2016-09-21 DIAGNOSIS — E032 Hypothyroidism due to medicaments and other exogenous substances: Secondary | ICD-10-CM | POA: Diagnosis not present

## 2016-10-25 DIAGNOSIS — R209 Unspecified disturbances of skin sensation: Secondary | ICD-10-CM | POA: Diagnosis not present

## 2016-10-25 DIAGNOSIS — L299 Pruritus, unspecified: Secondary | ICD-10-CM | POA: Diagnosis not present

## 2016-10-27 DIAGNOSIS — N3941 Urge incontinence: Secondary | ICD-10-CM | POA: Diagnosis not present

## 2016-10-27 DIAGNOSIS — N401 Enlarged prostate with lower urinary tract symptoms: Secondary | ICD-10-CM | POA: Diagnosis not present

## 2016-11-23 DIAGNOSIS — R609 Edema, unspecified: Secondary | ICD-10-CM | POA: Diagnosis not present

## 2016-11-23 DIAGNOSIS — E118 Type 2 diabetes mellitus with unspecified complications: Secondary | ICD-10-CM | POA: Diagnosis not present

## 2016-12-14 DIAGNOSIS — L602 Onychogryphosis: Secondary | ICD-10-CM | POA: Diagnosis not present

## 2016-12-14 DIAGNOSIS — E1351 Other specified diabetes mellitus with diabetic peripheral angiopathy without gangrene: Secondary | ICD-10-CM | POA: Diagnosis not present

## 2016-12-15 DIAGNOSIS — E118 Type 2 diabetes mellitus with unspecified complications: Secondary | ICD-10-CM | POA: Diagnosis not present

## 2016-12-15 DIAGNOSIS — I10 Essential (primary) hypertension: Secondary | ICD-10-CM | POA: Diagnosis not present

## 2016-12-21 DIAGNOSIS — E118 Type 2 diabetes mellitus with unspecified complications: Secondary | ICD-10-CM | POA: Diagnosis not present

## 2016-12-21 DIAGNOSIS — N400 Enlarged prostate without lower urinary tract symptoms: Secondary | ICD-10-CM | POA: Diagnosis not present

## 2016-12-21 DIAGNOSIS — E032 Hypothyroidism due to medicaments and other exogenous substances: Secondary | ICD-10-CM | POA: Diagnosis not present

## 2016-12-21 DIAGNOSIS — I10 Essential (primary) hypertension: Secondary | ICD-10-CM | POA: Diagnosis not present

## 2016-12-25 ENCOUNTER — Other Ambulatory Visit: Payer: Self-pay | Admitting: Endocrinology

## 2016-12-25 DIAGNOSIS — I872 Venous insufficiency (chronic) (peripheral): Secondary | ICD-10-CM

## 2017-01-11 ENCOUNTER — Ambulatory Visit
Admission: RE | Admit: 2017-01-11 | Discharge: 2017-01-11 | Disposition: A | Discharge status: Home / Self Care | Payer: Medicare HMO | Source: Ambulatory Visit | Attending: Endocrinology | Admitting: Endocrinology

## 2017-01-11 DIAGNOSIS — I872 Venous insufficiency (chronic) (peripheral): Secondary | ICD-10-CM

## 2017-01-11 DIAGNOSIS — R609 Edema, unspecified: Secondary | ICD-10-CM | POA: Diagnosis not present

## 2017-01-11 DIAGNOSIS — M7989 Other specified soft tissue disorders: Secondary | ICD-10-CM | POA: Diagnosis not present

## 2017-01-18 DIAGNOSIS — R600 Localized edema: Secondary | ICD-10-CM | POA: Diagnosis not present

## 2017-01-18 DIAGNOSIS — I10 Essential (primary) hypertension: Secondary | ICD-10-CM | POA: Diagnosis not present

## 2017-01-18 DIAGNOSIS — Z0189 Encounter for other specified special examinations: Secondary | ICD-10-CM | POA: Diagnosis not present

## 2017-02-08 DIAGNOSIS — R0989 Other specified symptoms and signs involving the circulatory and respiratory systems: Secondary | ICD-10-CM | POA: Diagnosis not present

## 2017-02-23 DIAGNOSIS — E118 Type 2 diabetes mellitus with unspecified complications: Secondary | ICD-10-CM | POA: Diagnosis not present

## 2017-02-23 DIAGNOSIS — I10 Essential (primary) hypertension: Secondary | ICD-10-CM | POA: Diagnosis not present

## 2017-03-01 DIAGNOSIS — R600 Localized edema: Secondary | ICD-10-CM | POA: Diagnosis not present

## 2017-03-01 DIAGNOSIS — I10 Essential (primary) hypertension: Secondary | ICD-10-CM | POA: Diagnosis not present

## 2017-03-01 DIAGNOSIS — Z0189 Encounter for other specified special examinations: Secondary | ICD-10-CM | POA: Diagnosis not present

## 2017-03-01 DIAGNOSIS — R0989 Other specified symptoms and signs involving the circulatory and respiratory systems: Secondary | ICD-10-CM | POA: Diagnosis not present

## 2017-03-02 DIAGNOSIS — I639 Cerebral infarction, unspecified: Secondary | ICD-10-CM | POA: Diagnosis not present

## 2017-03-02 DIAGNOSIS — E039 Hypothyroidism, unspecified: Secondary | ICD-10-CM | POA: Diagnosis not present

## 2017-03-02 DIAGNOSIS — I10 Essential (primary) hypertension: Secondary | ICD-10-CM | POA: Diagnosis not present

## 2017-03-02 DIAGNOSIS — E118 Type 2 diabetes mellitus with unspecified complications: Secondary | ICD-10-CM | POA: Diagnosis not present

## 2017-03-09 DIAGNOSIS — H401133 Primary open-angle glaucoma, bilateral, severe stage: Secondary | ICD-10-CM | POA: Diagnosis not present

## 2017-03-09 DIAGNOSIS — E1165 Type 2 diabetes mellitus with hyperglycemia: Secondary | ICD-10-CM | POA: Diagnosis not present

## 2017-03-09 DIAGNOSIS — H35033 Hypertensive retinopathy, bilateral: Secondary | ICD-10-CM | POA: Diagnosis not present

## 2017-03-09 DIAGNOSIS — E103593 Type 1 diabetes mellitus with proliferative diabetic retinopathy without macular edema, bilateral: Secondary | ICD-10-CM | POA: Diagnosis not present

## 2017-03-23 DIAGNOSIS — I10 Essential (primary) hypertension: Secondary | ICD-10-CM | POA: Diagnosis not present

## 2017-03-23 DIAGNOSIS — E039 Hypothyroidism, unspecified: Secondary | ICD-10-CM | POA: Diagnosis not present

## 2017-03-23 DIAGNOSIS — K7689 Other specified diseases of liver: Secondary | ICD-10-CM | POA: Diagnosis not present

## 2017-03-23 DIAGNOSIS — R972 Elevated prostate specific antigen [PSA]: Secondary | ICD-10-CM | POA: Diagnosis not present

## 2017-03-29 DIAGNOSIS — L602 Onychogryphosis: Secondary | ICD-10-CM | POA: Diagnosis not present

## 2017-03-29 DIAGNOSIS — E1351 Other specified diabetes mellitus with diabetic peripheral angiopathy without gangrene: Secondary | ICD-10-CM | POA: Diagnosis not present

## 2017-03-30 DIAGNOSIS — E1142 Type 2 diabetes mellitus with diabetic polyneuropathy: Secondary | ICD-10-CM | POA: Diagnosis not present

## 2017-03-30 DIAGNOSIS — N183 Chronic kidney disease, stage 3 (moderate): Secondary | ICD-10-CM | POA: Diagnosis not present

## 2017-03-30 DIAGNOSIS — E039 Hypothyroidism, unspecified: Secondary | ICD-10-CM | POA: Diagnosis not present

## 2017-03-30 DIAGNOSIS — Z23 Encounter for immunization: Secondary | ICD-10-CM | POA: Diagnosis not present

## 2017-03-30 DIAGNOSIS — I10 Essential (primary) hypertension: Secondary | ICD-10-CM | POA: Diagnosis not present

## 2017-03-30 DIAGNOSIS — R972 Elevated prostate specific antigen [PSA]: Secondary | ICD-10-CM | POA: Diagnosis not present

## 2017-03-30 DIAGNOSIS — E118 Type 2 diabetes mellitus with unspecified complications: Secondary | ICD-10-CM | POA: Diagnosis not present

## 2017-04-09 DIAGNOSIS — R3915 Urgency of urination: Secondary | ICD-10-CM | POA: Diagnosis not present

## 2017-04-09 DIAGNOSIS — N401 Enlarged prostate with lower urinary tract symptoms: Secondary | ICD-10-CM | POA: Diagnosis not present

## 2017-04-09 DIAGNOSIS — R972 Elevated prostate specific antigen [PSA]: Secondary | ICD-10-CM | POA: Diagnosis not present

## 2017-04-18 ENCOUNTER — Encounter (HOSPITAL_COMMUNITY): Payer: Self-pay | Admitting: Family Medicine

## 2017-04-18 ENCOUNTER — Ambulatory Visit (HOSPITAL_COMMUNITY)
Admission: EM | Admit: 2017-04-18 | Discharge: 2017-04-18 | Disposition: A | Discharge status: Home / Self Care | Payer: Medicare HMO | Attending: Family Medicine | Admitting: Family Medicine

## 2017-04-18 DIAGNOSIS — R195 Other fecal abnormalities: Secondary | ICD-10-CM

## 2017-04-18 NOTE — ED Triage Notes (Signed)
PT reports diarrhea for 3-4 days with approximately 4 episodes per day. PT denies red blood or dark/ tar/ coffee ground appearance. PT has continued to eat and drink.   PT denies abdominal pain. Denies nausea and vomiting. No fever.

## 2017-04-18 NOTE — Discharge Instructions (Signed)
Please reduce the Miralax by taking it every other day, starting Friday.

## 2017-04-18 NOTE — ED Provider Notes (Signed)
[facility]   [id number] 04/18/17 Arrival Time: 4098   SUBJECTIVE:  Mike Mack is a 81 y.o. male who presents to the urgent care with complaint of diarrhea for 3-4 days with approximately 4 episodes per day. PT denies red blood or dark/ tar/ coffee ground appearance. PT has continued to eat and drink.   PT denies abdominal pain. Denies nausea and vomiting. No fever.   He says he has three loose stools a day.  He is taking Miralax daily.  He tried one dose of Pepto Bismol to stop the loose stools.  No lightheaded episodes or dizziness.   Past Medical History:  Diagnosis Date  . Anemia 05/13/2013  . Arthritis    "right leg" (07/25/2013)  . BPH (benign prostatic hyperplasia)   . Constipation 05/13/2013  . Exertional shortness of breath    "sometimes" (07/25/2013)  . History of stomach ulcers   . Hypertension   . Neuropathy   . Polio   . Polio Childhood  . Small bowel obstruction ([place])   . Stroke [facility]) 2014   residual:  "left hand kind of numb" (07/25/2013)  . Type II diabetes mellitus ([facility])    Family History  Problem Relation Age of Onset  . Heart failure Mother   . Diabetes Sister   . Diabetes Sister   . Diabetes Brother   . Hypertension Sister   . Hypertension Sister   . Hypertension Brother   . Heart attack Sister    Social History   Social History  . Marital status: Married    Spouse name: Usher Hedberg  . Number of children: 2  . Years of education: N/A   Occupational History  . Retired, Curator    Social History Main Topics  . Smoking status: Never Smoker  . Smokeless tobacco: Never Used  . Alcohol use No  . Drug use: No  . Sexual activity: Yes   Other Topics Concern  . Not on file   Social History Narrative   Married.  Lives in [place] with wife.  Ambulates with a cane.   Current Meds  Medication Sig  . aspirin 325 MG tablet Take 325 mg by mouth daily.  Marland Kitchen atorvastatin (LIPITOR) 20 MG tablet Take 1 tablet by mouth every  evening.  . COMBIGAN 0.2-0.5 % ophthalmic solution Place 1 drop into both eyes 2 (two) times daily.  Marland Kitchen gabapentin (NEURONTIN) 300 MG capsule 2 EVERY MORNING & 2 AT NIGHT. IF NOT TOO SLEEPY GO TO 2 IN THE AM & 3 AT NIGHT AFTER A FEW DAYS  . insulin detemir (LEVEMIR) 100 UNIT/ML injection Inject 10-20 Units into the skin 2 (two) times daily. 10 units in the morning, 20 units in the evening  . irbesartan (AVAPRO) 300 MG tablet Take 300 mg by mouth daily.  Marland Kitchen latanoprost (XALATAN) 0.005 % ophthalmic solution   . Levothyroxine Sodium 88 MCG CAPS 88 mcg. Frequency:UNKNOWN   Dosage:0.0     Instructions:  Note:Dose: .  Marland Kitchen Multiple Vitamin (MULTIVITAMIN WITH MINERALS) TABS tablet Take 1 tablet by mouth daily.  . polyethylene glycol (MIRALAX / GLYCOLAX) packet Take 17 g by mouth daily.  . Tamsulosin HCl (FLOMAX) 0.4 MG CAPS Take 0.4 mg by mouth 2 (two) times daily.   . Travoprost, BAK Free, (TRAVATAN) 0.004 % SOLN ophthalmic solution Place 1 drop into both eyes at bedtime.  . VESICARE 5 MG tablet Take 5 mg by mouth daily.   No Known Allergies  Please note that I cannot edit  the problem list even though I recognize that the entries contain misinformation and redundancies.  ROS: As per HPI, remainder of ROS negative.   OBJECTIVE:   Vitals:   04/18/17 1206  BP: (!) 167/90  Pulse: 68  Resp: 16  Temp: 98.1 F (36.7 C)  TempSrc: Oral  SpO2: 98%  Weight: 171 lb (77.6 kg)     General appearance: alert; no distress Eyes: PERRL; EOMI; conjunctiva normal HENT: normocephalic; atraumatic;  external ears normal without trauma; nasal mucosa normal; oral mucosa normal Neck: supple Lungs: clear to auscultation bilaterally Heart: regular rate and rhythm Abdomen: soft, non-tender; bowel sounds normal; no masses or organomegaly; no guarding or rebound tenderness Back: no CVA tenderness Extremities: no cyanosis or edema; symmetrical with no gross deformities Skin: warm and dry Neurologic: normal gait;  grossly normal Psychological: alert and cooperative; normal mood and affect      Labs:  Results for orders placed or performed during the hospital encounter of 10/25/14  Culture, Urine  Result Value Ref Range   Specimen Description URINE, RANDOM    Special Requests NONE    Colony Count NO GROWTH Performed at [facility]     Culture NO GROWTH Performed at [facility]     Report Status 10/27/2014 FINAL   MRSA PCR Screening  Result Value Ref Range   MRSA by PCR NEGATIVE NEGATIVE  Comprehensive metabolic panel  Result Value Ref Range   Sodium 136 135 - 145 mmol/L   Potassium 4.9 3.5 - 5.1 mmol/L   Chloride 103 101 - 111 mmol/L   CO2 26 22 - 32 mmol/L   Glucose, Bld 249 (H) 70 - 99 mg/dL   BUN 17 6 - 20 mg/dL   Creatinine, Ser 2.15 (H) 0.61 - 1.24 mg/dL   Calcium 8.5 (L) 8.9 - 10.3 mg/dL   Total Protein 7.1 6.5 - 8.1 g/dL   Albumin 3.2 (L) 3.5 - 5.0 g/dL   AST 16 15 - 41 U/L   ALT 20 17 - 63 U/L   Alkaline Phosphatase 127 (H) 38 - 126 U/L   Total Bilirubin 0.7 0.3 - 1.2 mg/dL   GFR calc non Af Amer 28 (L) >60 mL/min   GFR calc Af Amer 33 (L) >60 mL/min   Anion gap 7 5 - 15  CBC with Differential/Platelet  Result Value Ref Range   WBC 6.3 4.0 - 10.5 K/uL   RBC 4.12 (L) 4.22 - 5.81 MIL/uL   Hemoglobin 11.0 (L) 13.0 - 17.0 g/dL   HCT 35.5 (L) 39.0 - 52.0 %   MCV 86.2 78.0 - 100.0 fL   MCH 26.7 26.0 - 34.0 pg   MCHC 31.0 30.0 - 36.0 g/dL   RDW 17.4 (H) 11.5 - 15.5 %   Platelets 234 150 - 400 K/uL   Neutrophils Relative % 73 43 - 77 %   Neutro Abs 4.6 1.7 - 7.7 K/uL   Lymphocytes Relative 20 12 - 46 %   Lymphs Abs 1.3 0.7 - 4.0 K/uL   Monocytes Relative 6 3 - 12 %   Monocytes Absolute 0.4 0.1 - 1.0 K/uL   Eosinophils Relative 1 0 - 5 %   Eosinophils Absolute 0.0 0.0 - 0.7 K/uL   Basophils Relative 0 0 - 1 %   Basophils Absolute 0.0 0.0 - 0.1 K/uL  Urinalysis, Routine w reflex microscopic  Result Value Ref Range   Color, Urine YELLOW YELLOW    APPearance CLOUDY (A) CLEAR   Specific  Gravity, Urine 1.020 1.005 - 1.030   pH 7.0 5.0 - 8.0   Glucose, UA >1000 (A) NEGATIVE mg/dL   Hgb urine dipstick SMALL (A) NEGATIVE   Bilirubin Urine NEGATIVE NEGATIVE   Ketones, ur NEGATIVE NEGATIVE mg/dL   Protein, ur NEGATIVE NEGATIVE mg/dL   Urobilinogen, UA 0.2 0.0 - 1.0 mg/dL   Nitrite NEGATIVE NEGATIVE   Leukocytes, UA MODERATE (A) NEGATIVE  Urine microscopic-add on  Result Value Ref Range   WBC, UA 11-20 <3 WBC/hpf   RBC / HPF 0-2 <3 RBC/hpf   Urine-Other RARE YEAST   Troponin I  Result Value Ref Range   Troponin I <0.03 <0.031 ng/mL  Troponin I  Result Value Ref Range   Troponin I <0.03 <0.031 ng/mL  Troponin I  Result Value Ref Range   Troponin I <0.03 <0.031 ng/mL  Basic metabolic panel  Result Value Ref Range   Sodium 136 135 - 145 mmol/L   Potassium 4.3 3.5 - 5.1 mmol/L   Chloride 107 101 - 111 mmol/L   CO2 23 22 - 32 mmol/L   Glucose, Bld 123 (H) 70 - 99 mg/dL   BUN 16 6 - 20 mg/dL   Creatinine, Ser 1.66 (H) 0.61 - 1.24 mg/dL   Calcium 8.2 (L) 8.9 - 10.3 mg/dL   GFR calc non Af Amer 38 (L) >60 mL/min   GFR calc Af Amer 45 (L) >60 mL/min   Anion gap 6 5 - 15  CBC  Result Value Ref Range   WBC 7.7 4.0 - 10.5 K/uL   RBC 4.03 (L) 4.22 - 5.81 MIL/uL   Hemoglobin 10.6 (L) 13.0 - 17.0 g/dL   HCT 34.5 (L) 39.0 - 52.0 %   MCV 85.6 78.0 - 100.0 fL   MCH 26.3 26.0 - 34.0 pg   MCHC 30.7 30.0 - 36.0 g/dL   RDW 17.2 (H) 11.5 - 15.5 %   Platelets 245 150 - 400 K/uL  Glucose, capillary  Result Value Ref Range   Glucose-Capillary 182 (H) 70 - 99 mg/dL  Glucose, capillary  Result Value Ref Range   Glucose-Capillary 188 (H) 70 - 99 mg/dL  Glucose, capillary  Result Value Ref Range   Glucose-Capillary 112 (H) 70 - 99 mg/dL   Comment 1 Notify RN   Glucose, capillary  Result Value Ref Range   Glucose-Capillary 101 (H) 70 - 99 mg/dL  Glucose, capillary  Result Value Ref Range   Glucose-Capillary 157 (H) 70 - 99 mg/dL    Basic metabolic panel  Result Value Ref Range   Sodium 137 135 - 145 mmol/L   Potassium 4.4 3.5 - 5.1 mmol/L   Chloride 107 101 - 111 mmol/L   CO2 21 (L) 22 - 32 mmol/L   Glucose, Bld 87 70 - 99 mg/dL   BUN 20 6 - 20 mg/dL   Creatinine, Ser 1.52 (H) 0.61 - 1.24 mg/dL   Calcium 8.7 (L) 8.9 - 10.3 mg/dL   GFR calc non Af Amer 43 (L) >60 mL/min   GFR calc Af Amer 50 (L) >60 mL/min   Anion gap 9 5 - 15  Glucose, capillary  Result Value Ref Range   Glucose-Capillary 96 70 - 99 mg/dL  Glucose, capillary  Result Value Ref Range   Glucose-Capillary 129 (H) 70 - 99 mg/dL  Glucose, capillary  Result Value Ref Range   Glucose-Capillary 84 70 - 99 mg/dL   Comment 1 Notify RN   Glucose, capillary  Result Value Ref Range  Glucose-Capillary 99 70 - 99 mg/dL  Glucose, capillary  Result Value Ref Range   Glucose-Capillary 165 (H) 70 - 99 mg/dL  Glucose, capillary  Result Value Ref Range   Glucose-Capillary 136 (H) 70 - 99 mg/dL  Glucose, capillary  Result Value Ref Range   Glucose-Capillary 122 (H) 70 - 99 mg/dL  Glucose, capillary  Result Value Ref Range   Glucose-Capillary 135 (H) 70 - 99 mg/dL  Hemoglobin A1c  Result Value Ref Range   Hgb A1c MFr Bld 9.7 (H) 4.8 - 5.6 %   Mean Plasma Glucose 232 mg/dL  Glucose, capillary  Result Value Ref Range   Glucose-Capillary 108 (H) 70 - 99 mg/dL    Labs Reviewed - No data to display  No results found.     ASSESSMENT & PLAN:  1. Loose stools     Reduce frequency of Miralax to qod.  Reviewed expectations re: course of current medical issues. Questions answered. Outlined signs and symptoms indicating need for more acute intervention. Patient verbalized understanding. After Visit Summary given.      Robyn Haber, MD 04/18/17 1217

## 2017-04-26 DIAGNOSIS — H4720 Unspecified optic atrophy: Secondary | ICD-10-CM | POA: Diagnosis not present

## 2017-04-26 DIAGNOSIS — H462 Nutritional optic neuropathy: Secondary | ICD-10-CM | POA: Diagnosis not present

## 2017-04-26 DIAGNOSIS — H401132 Primary open-angle glaucoma, bilateral, moderate stage: Secondary | ICD-10-CM | POA: Diagnosis not present

## 2017-04-26 DIAGNOSIS — E113553 Type 2 diabetes mellitus with stable proliferative diabetic retinopathy, bilateral: Secondary | ICD-10-CM | POA: Diagnosis not present

## 2017-06-22 DIAGNOSIS — E118 Type 2 diabetes mellitus with unspecified complications: Secondary | ICD-10-CM | POA: Diagnosis not present

## 2017-06-22 DIAGNOSIS — E039 Hypothyroidism, unspecified: Secondary | ICD-10-CM | POA: Diagnosis not present

## 2017-06-28 DIAGNOSIS — L602 Onychogryphosis: Secondary | ICD-10-CM | POA: Diagnosis not present

## 2017-06-28 DIAGNOSIS — E1351 Other specified diabetes mellitus with diabetic peripheral angiopathy without gangrene: Secondary | ICD-10-CM | POA: Diagnosis not present

## 2017-06-29 DIAGNOSIS — E039 Hypothyroidism, unspecified: Secondary | ICD-10-CM | POA: Diagnosis not present

## 2017-06-29 DIAGNOSIS — E118 Type 2 diabetes mellitus with unspecified complications: Secondary | ICD-10-CM | POA: Diagnosis not present

## 2017-06-29 DIAGNOSIS — C61 Malignant neoplasm of prostate: Secondary | ICD-10-CM | POA: Diagnosis not present

## 2017-09-07 DIAGNOSIS — H04123 Dry eye syndrome of bilateral lacrimal glands: Secondary | ICD-10-CM | POA: Diagnosis not present

## 2017-09-07 DIAGNOSIS — H401133 Primary open-angle glaucoma, bilateral, severe stage: Secondary | ICD-10-CM | POA: Diagnosis not present

## 2017-09-14 DIAGNOSIS — E118 Type 2 diabetes mellitus with unspecified complications: Secondary | ICD-10-CM | POA: Diagnosis not present

## 2017-09-14 DIAGNOSIS — E039 Hypothyroidism, unspecified: Secondary | ICD-10-CM | POA: Diagnosis not present

## 2017-09-20 DIAGNOSIS — E7800 Pure hypercholesterolemia, unspecified: Secondary | ICD-10-CM | POA: Diagnosis not present

## 2017-09-20 DIAGNOSIS — I10 Essential (primary) hypertension: Secondary | ICD-10-CM | POA: Diagnosis not present

## 2017-09-20 DIAGNOSIS — E118 Type 2 diabetes mellitus with unspecified complications: Secondary | ICD-10-CM | POA: Diagnosis not present

## 2017-09-20 DIAGNOSIS — E039 Hypothyroidism, unspecified: Secondary | ICD-10-CM | POA: Diagnosis not present

## 2017-09-20 DIAGNOSIS — L309 Dermatitis, unspecified: Secondary | ICD-10-CM | POA: Diagnosis not present

## 2017-09-21 DIAGNOSIS — R972 Elevated prostate specific antigen [PSA]: Secondary | ICD-10-CM | POA: Diagnosis not present

## 2017-09-28 DIAGNOSIS — N401 Enlarged prostate with lower urinary tract symptoms: Secondary | ICD-10-CM | POA: Diagnosis not present

## 2017-09-28 DIAGNOSIS — R351 Nocturia: Secondary | ICD-10-CM | POA: Diagnosis not present

## 2017-09-28 DIAGNOSIS — R972 Elevated prostate specific antigen [PSA]: Secondary | ICD-10-CM | POA: Diagnosis not present

## 2017-10-04 DIAGNOSIS — L84 Corns and callosities: Secondary | ICD-10-CM | POA: Diagnosis not present

## 2017-10-04 DIAGNOSIS — I739 Peripheral vascular disease, unspecified: Secondary | ICD-10-CM | POA: Diagnosis not present

## 2017-10-04 DIAGNOSIS — L602 Onychogryphosis: Secondary | ICD-10-CM | POA: Diagnosis not present

## 2017-10-12 DIAGNOSIS — G43009 Migraine without aura, not intractable, without status migrainosus: Secondary | ICD-10-CM | POA: Diagnosis not present

## 2017-10-12 DIAGNOSIS — Z8673 Personal history of transient ischemic attack (TIA), and cerebral infarction without residual deficits: Secondary | ICD-10-CM | POA: Diagnosis not present

## 2017-10-12 DIAGNOSIS — M542 Cervicalgia: Secondary | ICD-10-CM | POA: Diagnosis not present

## 2017-10-12 DIAGNOSIS — G14 Postpolio syndrome: Secondary | ICD-10-CM | POA: Diagnosis not present

## 2017-10-19 DIAGNOSIS — I6523 Occlusion and stenosis of bilateral carotid arteries: Secondary | ICD-10-CM | POA: Diagnosis not present

## 2017-10-19 DIAGNOSIS — Z8673 Personal history of transient ischemic attack (TIA), and cerebral infarction without residual deficits: Secondary | ICD-10-CM | POA: Diagnosis not present

## 2017-12-27 DIAGNOSIS — E118 Type 2 diabetes mellitus with unspecified complications: Secondary | ICD-10-CM | POA: Diagnosis not present

## 2018-01-03 DIAGNOSIS — E039 Hypothyroidism, unspecified: Secondary | ICD-10-CM | POA: Diagnosis not present

## 2018-01-03 DIAGNOSIS — I10 Essential (primary) hypertension: Secondary | ICD-10-CM | POA: Diagnosis not present

## 2018-01-03 DIAGNOSIS — L304 Erythema intertrigo: Secondary | ICD-10-CM | POA: Diagnosis not present

## 2018-01-03 DIAGNOSIS — E7800 Pure hypercholesterolemia, unspecified: Secondary | ICD-10-CM | POA: Diagnosis not present

## 2018-01-03 DIAGNOSIS — L309 Dermatitis, unspecified: Secondary | ICD-10-CM | POA: Diagnosis not present

## 2018-01-03 DIAGNOSIS — E118 Type 2 diabetes mellitus with unspecified complications: Secondary | ICD-10-CM | POA: Diagnosis not present

## 2018-01-04 DIAGNOSIS — I10 Essential (primary) hypertension: Secondary | ICD-10-CM | POA: Diagnosis not present

## 2018-01-04 DIAGNOSIS — E118 Type 2 diabetes mellitus with unspecified complications: Secondary | ICD-10-CM | POA: Diagnosis not present

## 2018-01-10 DIAGNOSIS — L84 Corns and callosities: Secondary | ICD-10-CM | POA: Diagnosis not present

## 2018-01-10 DIAGNOSIS — L602 Onychogryphosis: Secondary | ICD-10-CM | POA: Diagnosis not present

## 2018-01-10 DIAGNOSIS — E1351 Other specified diabetes mellitus with diabetic peripheral angiopathy without gangrene: Secondary | ICD-10-CM | POA: Diagnosis not present

## 2018-01-24 DIAGNOSIS — E113553 Type 2 diabetes mellitus with stable proliferative diabetic retinopathy, bilateral: Secondary | ICD-10-CM | POA: Diagnosis not present

## 2018-01-24 DIAGNOSIS — H462 Nutritional optic neuropathy: Secondary | ICD-10-CM | POA: Diagnosis not present

## 2018-01-24 DIAGNOSIS — H4720 Unspecified optic atrophy: Secondary | ICD-10-CM | POA: Diagnosis not present

## 2018-01-24 DIAGNOSIS — H401132 Primary open-angle glaucoma, bilateral, moderate stage: Secondary | ICD-10-CM | POA: Diagnosis not present

## 2018-02-08 DIAGNOSIS — E118 Type 2 diabetes mellitus with unspecified complications: Secondary | ICD-10-CM | POA: Diagnosis not present

## 2018-03-07 DIAGNOSIS — I10 Essential (primary) hypertension: Secondary | ICD-10-CM | POA: Diagnosis not present

## 2018-03-07 DIAGNOSIS — J3089 Other allergic rhinitis: Secondary | ICD-10-CM | POA: Diagnosis not present

## 2018-03-15 DIAGNOSIS — E1165 Type 2 diabetes mellitus with hyperglycemia: Secondary | ICD-10-CM | POA: Diagnosis not present

## 2018-03-15 DIAGNOSIS — E103593 Type 1 diabetes mellitus with proliferative diabetic retinopathy without macular edema, bilateral: Secondary | ICD-10-CM | POA: Diagnosis not present

## 2018-03-15 DIAGNOSIS — H401133 Primary open-angle glaucoma, bilateral, severe stage: Secondary | ICD-10-CM | POA: Diagnosis not present

## 2018-03-15 DIAGNOSIS — H04123 Dry eye syndrome of bilateral lacrimal glands: Secondary | ICD-10-CM | POA: Diagnosis not present

## 2018-03-22 DIAGNOSIS — E118 Type 2 diabetes mellitus with unspecified complications: Secondary | ICD-10-CM | POA: Diagnosis not present

## 2018-04-11 DIAGNOSIS — L602 Onychogryphosis: Secondary | ICD-10-CM | POA: Diagnosis not present

## 2018-04-11 DIAGNOSIS — L84 Corns and callosities: Secondary | ICD-10-CM | POA: Diagnosis not present

## 2018-04-11 DIAGNOSIS — E1351 Other specified diabetes mellitus with diabetic peripheral angiopathy without gangrene: Secondary | ICD-10-CM | POA: Diagnosis not present

## 2018-04-19 DIAGNOSIS — Z8673 Personal history of transient ischemic attack (TIA), and cerebral infarction without residual deficits: Secondary | ICD-10-CM | POA: Diagnosis not present

## 2018-04-19 DIAGNOSIS — G43009 Migraine without aura, not intractable, without status migrainosus: Secondary | ICD-10-CM | POA: Diagnosis not present

## 2018-05-03 DIAGNOSIS — E7800 Pure hypercholesterolemia, unspecified: Secondary | ICD-10-CM | POA: Diagnosis not present

## 2018-05-03 DIAGNOSIS — E118 Type 2 diabetes mellitus with unspecified complications: Secondary | ICD-10-CM | POA: Diagnosis not present

## 2018-05-03 DIAGNOSIS — I10 Essential (primary) hypertension: Secondary | ICD-10-CM | POA: Diagnosis not present

## 2018-06-28 DIAGNOSIS — E789 Disorder of lipoprotein metabolism, unspecified: Secondary | ICD-10-CM | POA: Diagnosis not present

## 2018-06-28 DIAGNOSIS — R972 Elevated prostate specific antigen [PSA]: Secondary | ICD-10-CM | POA: Diagnosis not present

## 2018-06-28 DIAGNOSIS — N390 Urinary tract infection, site not specified: Secondary | ICD-10-CM | POA: Diagnosis not present

## 2018-06-28 DIAGNOSIS — E118 Type 2 diabetes mellitus with unspecified complications: Secondary | ICD-10-CM | POA: Diagnosis not present

## 2018-06-28 DIAGNOSIS — I10 Essential (primary) hypertension: Secondary | ICD-10-CM | POA: Diagnosis not present

## 2018-07-05 DIAGNOSIS — E118 Type 2 diabetes mellitus with unspecified complications: Secondary | ICD-10-CM | POA: Diagnosis not present

## 2018-07-05 DIAGNOSIS — N3001 Acute cystitis with hematuria: Secondary | ICD-10-CM | POA: Diagnosis not present

## 2018-07-05 DIAGNOSIS — I10 Essential (primary) hypertension: Secondary | ICD-10-CM | POA: Diagnosis not present

## 2018-07-05 DIAGNOSIS — E039 Hypothyroidism, unspecified: Secondary | ICD-10-CM | POA: Diagnosis not present

## 2018-07-05 DIAGNOSIS — Z0000 Encounter for general adult medical examination without abnormal findings: Secondary | ICD-10-CM | POA: Diagnosis not present

## 2018-07-05 DIAGNOSIS — Z23 Encounter for immunization: Secondary | ICD-10-CM | POA: Diagnosis not present

## 2018-07-05 DIAGNOSIS — E7800 Pure hypercholesterolemia, unspecified: Secondary | ICD-10-CM | POA: Diagnosis not present

## 2018-07-11 DIAGNOSIS — E1351 Other specified diabetes mellitus with diabetic peripheral angiopathy without gangrene: Secondary | ICD-10-CM | POA: Diagnosis not present

## 2018-07-11 DIAGNOSIS — L84 Corns and callosities: Secondary | ICD-10-CM | POA: Diagnosis not present

## 2018-07-11 DIAGNOSIS — L602 Onychogryphosis: Secondary | ICD-10-CM | POA: Diagnosis not present

## 2018-08-02 DIAGNOSIS — E039 Hypothyroidism, unspecified: Secondary | ICD-10-CM | POA: Diagnosis not present

## 2018-08-02 DIAGNOSIS — R300 Dysuria: Secondary | ICD-10-CM | POA: Diagnosis not present

## 2018-08-02 DIAGNOSIS — I639 Cerebral infarction, unspecified: Secondary | ICD-10-CM | POA: Diagnosis not present

## 2018-08-02 DIAGNOSIS — E118 Type 2 diabetes mellitus with unspecified complications: Secondary | ICD-10-CM | POA: Diagnosis not present

## 2018-08-02 DIAGNOSIS — I10 Essential (primary) hypertension: Secondary | ICD-10-CM | POA: Diagnosis not present

## 2018-08-02 DIAGNOSIS — E7800 Pure hypercholesterolemia, unspecified: Secondary | ICD-10-CM | POA: Diagnosis not present

## 2018-08-08 DIAGNOSIS — E118 Type 2 diabetes mellitus with unspecified complications: Secondary | ICD-10-CM | POA: Diagnosis not present

## 2018-08-08 DIAGNOSIS — E7800 Pure hypercholesterolemia, unspecified: Secondary | ICD-10-CM | POA: Diagnosis not present

## 2018-08-08 DIAGNOSIS — I639 Cerebral infarction, unspecified: Secondary | ICD-10-CM | POA: Diagnosis not present

## 2018-08-08 DIAGNOSIS — I10 Essential (primary) hypertension: Secondary | ICD-10-CM | POA: Diagnosis not present

## 2018-08-15 DIAGNOSIS — E118 Type 2 diabetes mellitus with unspecified complications: Secondary | ICD-10-CM | POA: Diagnosis not present

## 2018-08-15 DIAGNOSIS — I639 Cerebral infarction, unspecified: Secondary | ICD-10-CM | POA: Diagnosis not present

## 2018-08-15 DIAGNOSIS — I10 Essential (primary) hypertension: Secondary | ICD-10-CM | POA: Diagnosis not present

## 2018-08-15 DIAGNOSIS — E7800 Pure hypercholesterolemia, unspecified: Secondary | ICD-10-CM | POA: Diagnosis not present

## 2018-08-23 DIAGNOSIS — R3915 Urgency of urination: Secondary | ICD-10-CM | POA: Diagnosis not present

## 2018-08-23 DIAGNOSIS — R3121 Asymptomatic microscopic hematuria: Secondary | ICD-10-CM | POA: Diagnosis not present

## 2018-08-23 DIAGNOSIS — N401 Enlarged prostate with lower urinary tract symptoms: Secondary | ICD-10-CM | POA: Diagnosis not present

## 2018-09-06 DIAGNOSIS — N390 Urinary tract infection, site not specified: Secondary | ICD-10-CM | POA: Diagnosis not present

## 2018-09-06 DIAGNOSIS — N200 Calculus of kidney: Secondary | ICD-10-CM | POA: Diagnosis not present

## 2018-09-12 DIAGNOSIS — N201 Calculus of ureter: Secondary | ICD-10-CM | POA: Diagnosis not present

## 2018-09-12 DIAGNOSIS — R300 Dysuria: Secondary | ICD-10-CM | POA: Diagnosis not present

## 2018-09-12 DIAGNOSIS — N281 Cyst of kidney, acquired: Secondary | ICD-10-CM | POA: Diagnosis not present

## 2018-09-12 DIAGNOSIS — N472 Paraphimosis: Secondary | ICD-10-CM | POA: Diagnosis not present

## 2018-09-19 DIAGNOSIS — N201 Calculus of ureter: Secondary | ICD-10-CM | POA: Diagnosis not present

## 2018-09-20 DIAGNOSIS — N183 Chronic kidney disease, stage 3 (moderate): Secondary | ICD-10-CM | POA: Diagnosis not present

## 2018-09-20 DIAGNOSIS — E039 Hypothyroidism, unspecified: Secondary | ICD-10-CM | POA: Diagnosis not present

## 2018-09-20 DIAGNOSIS — I10 Essential (primary) hypertension: Secondary | ICD-10-CM | POA: Diagnosis not present

## 2018-09-20 DIAGNOSIS — E7800 Pure hypercholesterolemia, unspecified: Secondary | ICD-10-CM | POA: Diagnosis not present

## 2018-09-20 DIAGNOSIS — E118 Type 2 diabetes mellitus with unspecified complications: Secondary | ICD-10-CM | POA: Diagnosis not present

## 2018-09-26 DIAGNOSIS — N201 Calculus of ureter: Secondary | ICD-10-CM | POA: Diagnosis not present

## 2018-09-26 DIAGNOSIS — R102 Pelvic and perineal pain: Secondary | ICD-10-CM | POA: Diagnosis not present

## 2018-09-30 ENCOUNTER — Other Ambulatory Visit: Payer: Self-pay | Admitting: Urology

## 2018-09-30 ENCOUNTER — Encounter (HOSPITAL_COMMUNITY): Payer: Self-pay

## 2018-10-02 NOTE — H&P (Signed)
Office Visit Report     09/26/2018   --------------------------------------------------------------------------------   Mike Mack  MRN: [id number]  PRIMARY CARE:  Arleta Creek, MD  DOB: [birth year], 82 year old Male  REFERRING:  Teodora Medici. Kim ([place]), MD  SSN: [id number]  PROVIDER:  Wendie Simmer, M.D.    LOCATION:  [facility] [phone]   --------------------------------------------------------------------------------   CC/HPI: 04/09/17  Mike Mack presents today for routine follow-up. He is 83 years of age. He has had long-standing voiding issues and has been on alpha blocker as well as OAB medication the past. It appears at this time. He is just on tamsulosin. Overall voiding status is relatively unchanged. He presents today because of an increase in his PSA from baseline. This was drawn by his primary care provider. He does have a number of medical comorbidities. He has no other new complaints or concerns today.   09/28/17  Previous patient of Dr. Risa Grill. The patient continues on Flomax. He has stable lower urinary tract symptoms. He has nocturia 2 and a relatively strong stream, and no hesitancy. He had his PSA rechecked on 09/21/2017 which is down closer to baseline at 6.86. He had a favorable percent free PSA at 34. He has no complaints today. No microscopic hematuria.    08/23/18: Patient with above noted history. He presents today with complaints of malodorous urine. He also complains of increased frequency and urgency from his baseline. He has also noted some intermittent dysuria. He states symptoms have been ongoing for the past months. He remains on Tamsulosin and continues to feel that his urinary stream is fair. He denies straining, but will have some intermittency on occasion. He generally feels that he empties well. He denies gross hematuria, fevers, chills, nausea, or vomiting. No history of kidney stones.   09/12/2018  Patient  continues to have minimal lower urinary tract symptoms. He does however continue to have some intermittent penile pain and dysuria. Urine culture at the last visit was negative. Urinalysis is not consistent with infection today. On examination, he has paraphimosis. He underwent a CT IVP which revealed bilateral renal cysts. No evidence of malignancy. Possible bladder stone versus left ureterovesicular junction calculus with no hydronephrosis.   09/26/2018  Patient returns today after undergoing a repeat CT without contrast. This revealed persistent stone in the distal right ureter. It is a small stone but he has failed to pass it. He continues to have intermittent penile and groin pain. Stone is visible on scout imaging     CC: AUA Questions Scoring.  HPI: Mike Mack is a 83 year-old male established patient who is here AUA Questions.      AUA Symptom Score: Less than 20% of the time he has the sensation of not emptying his bladder completely when finished urinating. Less than 20% of the time he has to urinate again fewer than two hours after he has finished urinating. He does not have to stop and start again several times when he urinates. More than 50% of the time he finds it difficult to postpone urination. He never has a weak urinary stream. Less than 50% of the time he has to push or strain to begin urination. He has to get up to urinate 3 times from the time he goes to bed until the time he gets up in the morning.   Calculated AUA Symptom Score: 11    ALLERGIES: No Allergies    MEDICATIONS: Metoprolol Tartrate 25 mg  tablet  Tamsulosin Hcl 0.4 mg capsule  Actos  Atorvastatin Calcium 20 mg tablet Oral  Baby Aspirin 81 mg tablet,chewable Oral  Chlorpromazine Hcl 25 mg tablet  Doxepin Hcl 25 mg capsule  Gabapentin 800 mg tablet Oral  Irbesartan 300 mg tablet Oral  Latanoprost 0.005 % drops Ophthalmic  Levemir FlexPen 100 UNIT/ML SOLN Subcutaneous  Miralax  Synthroid 88 mcg tablet  Oral  Vitamin B Complex capsule Oral  Vitamin D3 10,000 unit tablet Oral     GU PSH: Cystoscopy - 09/12/2018 Locm 300-399Mg /Ml Iodine,1Ml - 09/06/2018      PSH Notes: Gastric Surgery   NON-GU PSH: Cholecystectomy (open) - 2015 Hernia Repair - 2013    GU PMH: Dysuria - 09/12/2018 Paraphimosis - 09/12/2018 Renal cyst - 09/12/2018 Ureteral calculus - 09/12/2018 Microscopic hematuria - 08/23/2018 BPH w/LUTS (Stable) - 09/28/2017, Benign prostatic hyperplasia with urinary obstruction, - 2017 Nocturia - 09/28/2017 Urinary Urgency - 04/09/2017 Urge incontinence, Urge incontinence of urine - 2017 Urinary Tract Inf, Unspec site, Suspected urinary tract infection - 2016 Elevated PSA, Elevated prostate specific antigen (PSA) - 2014      PMH Notes:  2012-03-29 09:14:30 - Note: Tuberculosis   NON-GU PMH: Encounter for general adult medical examination without abnormal findings, Encounter for preventive health examination - 2017 Personal history of other diseases of the circulatory system, History of hypertension - 2014 Personal history of other diseases of the nervous system and sense organs, History of glaucoma - 2014 Personal history of other endocrine, nutritional and metabolic disease, History of hypercholesterolemia - 2014, History of diabetes mellitus, - 2014    FAMILY HISTORY: Acute Myocardial Infarction - Father Family Health Status Number - Runs In Family Father Deceased At Age51 ___ - Runs In Family Mother Deceased At Age 41 from diabetic complicati - Runs In Family   SOCIAL HISTORY: Marital Status: Married Preferred Language: English; Ethnicity: Not Hispanic Or Latino; Race: Black or African American Current Smoking Status: Patient has never smoked.   Tobacco Use Assessment Completed: Used Tobacco in last 30 days? Has never drank.  Does not drink caffeine.    REVIEW OF SYSTEMS:    GU Review Male:   Patient denies frequent urination, hard to postpone urination, burning/ pain  with urination, get up at night to urinate, leakage of urine, stream starts and stops, trouble starting your stream, have to strain to urinate , erection problems, and penile pain.  Gastrointestinal (Upper):   Patient denies nausea, vomiting, and indigestion/ heartburn.  Gastrointestinal (Lower):   Patient denies diarrhea and constipation.  Constitutional:   Patient denies fever, night sweats, weight loss, and fatigue.  Skin:   Patient denies skin rash/ lesion and itching.  Eyes:   Patient denies blurred vision and double vision.  Ears/ Nose/ Throat:   Patient denies sore throat and sinus problems.  Hematologic/Lymphatic:   Patient denies swollen glands and easy bruising.  Cardiovascular:   Patient denies leg swelling and chest pains.  Respiratory:   Patient denies cough and shortness of breath.  Endocrine:   Patient denies excessive thirst.  Musculoskeletal:   Patient denies joint pain and back pain.  Neurological:   Patient denies headaches and dizziness.  Psychologic:   Patient denies depression and anxiety.   VITAL SIGNS:      09/26/2018 01:48 PM  BP 144/81 mmHg  Heart Rate 58 /min  Temperature 98.6 F / 37 C   MULTI-SYSTEM PHYSICAL EXAMINATION:    Constitutional: Well-nourished. No physical deformities. Normally developed. Good grooming.  Respiratory:  No labored breathing, no use of accessory muscles.   Cardiovascular: Normal temperature, adequate perfusion of extremities  Skin: No paleness, no jaundice, no cyanosis. No lesion, no ulcer, no rash.  Neurologic / Psychiatric: Oriented to time, oriented to place, oriented to person. No depression, no anxiety, no agitation.  Gastrointestinal: No mass, no tenderness, no rigidity, non obese abdomen.  Eyes: Normal conjunctivae. Normal eyelids.  Musculoskeletal: Needs assistance with walking     PAST DATA REVIEWED:  Source Of History:  Patient  X-Ray Review: C.T. Abdomen/Pelvis: Reviewed Films. Reviewed Report. Discussed With Patient.      09/21/17 02/12/09  PSA  Total PSA 6.86 ng/mL 5.54   Free PSA 2.34 ng/mL 1.47   % Free PSA 34 % PSA 26.5     PROCEDURES: None   ASSESSMENT:      ICD-10 Details  1 GU:   Pelvic/perineal pain - R10.2   2   Ureteral calculus - N20.1 Stable   PLAN:           Document Letter(s):  Created for Patient: Clinical Summary         Notes:   He would like to proceed with ESWL. He understands potential of bleeding, infection, injury to surrounding structures, failure to clear stone requiring another procedure.   Cc: Gareth Eagle, M.D.    ** Signed by Link Snuffer, III, M.D. on 09/26/18 at 2:08 PM (EDT)**     The information contained in this medical record document is considered private and confidential patient information. This information can only be used for the medical diagnosis and/or medical services that are being provided by the patient's selected caregivers. This information can only be distributed outside of the patient's care if the patient agrees and signs waivers of authorization for this information to be sent to an outside source or route.

## 2018-10-03 ENCOUNTER — Ambulatory Visit (HOSPITAL_COMMUNITY): Payer: Medicare HMO

## 2018-10-03 ENCOUNTER — Encounter (HOSPITAL_COMMUNITY)
Admission: RE | Disposition: A | Discharge status: Home / Self Care | Payer: Self-pay | Source: Home / Self Care | Attending: Urology

## 2018-10-03 ENCOUNTER — Ambulatory Visit (HOSPITAL_COMMUNITY)
Admission: RE | Admit: 2018-10-03 | Discharge: 2018-10-03 | Disposition: A | Discharge status: Home / Self Care | Payer: Medicare HMO | Attending: Urology | Admitting: Urology

## 2018-10-03 ENCOUNTER — Other Ambulatory Visit: Payer: Self-pay

## 2018-10-03 ENCOUNTER — Encounter (HOSPITAL_COMMUNITY): Payer: Self-pay | Admitting: *Deleted

## 2018-10-03 DIAGNOSIS — N201 Calculus of ureter: Secondary | ICD-10-CM | POA: Diagnosis not present

## 2018-10-03 DIAGNOSIS — I10 Essential (primary) hypertension: Secondary | ICD-10-CM | POA: Insufficient documentation

## 2018-10-03 DIAGNOSIS — E7800 Pure hypercholesterolemia, unspecified: Secondary | ICD-10-CM | POA: Diagnosis not present

## 2018-10-03 DIAGNOSIS — E669 Obesity, unspecified: Secondary | ICD-10-CM | POA: Insufficient documentation

## 2018-10-03 DIAGNOSIS — Z9049 Acquired absence of other specified parts of digestive tract: Secondary | ICD-10-CM | POA: Insufficient documentation

## 2018-10-03 DIAGNOSIS — Z833 Family history of diabetes mellitus: Secondary | ICD-10-CM | POA: Diagnosis not present

## 2018-10-03 DIAGNOSIS — Z6830 Body mass index (BMI) 30.0-30.9, adult: Secondary | ICD-10-CM | POA: Diagnosis not present

## 2018-10-03 DIAGNOSIS — Z79899 Other long term (current) drug therapy: Secondary | ICD-10-CM | POA: Insufficient documentation

## 2018-10-03 DIAGNOSIS — Z7982 Long term (current) use of aspirin: Secondary | ICD-10-CM | POA: Diagnosis not present

## 2018-10-03 DIAGNOSIS — H409 Unspecified glaucoma: Secondary | ICD-10-CM | POA: Diagnosis not present

## 2018-10-03 DIAGNOSIS — Z8249 Family history of ischemic heart disease and other diseases of the circulatory system: Secondary | ICD-10-CM | POA: Insufficient documentation

## 2018-10-03 DIAGNOSIS — E119 Type 2 diabetes mellitus without complications: Secondary | ICD-10-CM | POA: Insufficient documentation

## 2018-10-03 DIAGNOSIS — Z8673 Personal history of transient ischemic attack (TIA), and cerebral infarction without residual deficits: Secondary | ICD-10-CM | POA: Diagnosis not present

## 2018-10-03 HISTORY — PX: EXTRACORPOREAL SHOCK WAVE LITHOTRIPSY: SHX1557

## 2018-10-03 HISTORY — DX: Personal history of urinary calculi: Z87.442

## 2018-10-03 LAB — GLUCOSE, CAPILLARY: Glucose-Capillary: 114 mg/dL — ABNORMAL HIGH (ref 70–99)

## 2018-10-03 SURGERY — LITHOTRIPSY, ESWL
Anesthesia: LOCAL | Laterality: Right

## 2018-10-03 MED ORDER — DIPHENHYDRAMINE HCL 25 MG PO CAPS
25.0000 mg | ORAL_CAPSULE | ORAL | Status: AC
  Administered 2018-10-03: 25 mg via ORAL
  Filled 2018-10-03: qty 1

## 2018-10-03 MED ORDER — ASPIRIN 325 MG PO TABS: 325.0000 mg | ORAL_TABLET | Freq: Every day | ORAL | Status: DC

## 2018-10-03 MED ORDER — CIPROFLOXACIN HCL 500 MG PO TABS
500.0000 mg | ORAL_TABLET | ORAL | Status: AC
  Administered 2018-10-03: 07:00:00 500 mg via ORAL
  Filled 2018-10-03: qty 1

## 2018-10-03 MED ORDER — DIAZEPAM 5 MG PO TABS
10.0000 mg | ORAL_TABLET | ORAL | Status: AC
  Administered 2018-10-03: 10 mg via ORAL
  Filled 2018-10-03: qty 2

## 2018-10-03 MED ORDER — SODIUM CHLORIDE 0.9 % IV SOLN
INTRAVENOUS | Status: DC
  Administered 2018-10-03: 07:00:00 via INTRAVENOUS

## 2018-10-03 NOTE — Interval H&P Note (Signed)
History and Physical Interval Note:  10/03/2018 7:43 AM  Mike Mack  has presented today for surgery, with the diagnosis of RIGHT URETERAL STONE.  The various methods of treatment have been discussed with the patient and family. After consideration of risks, benefits and other options for treatment, the patient has consented to  Procedure(s): EXTRACORPOREAL SHOCK WAVE LITHOTRIPSY (ESWL) (Right) as a surgical intervention.  The patient's history has been reviewed, patient examined, no change in status, stable for surgery.  I have reviewed the patient's chart and labs. He is well. No fever. He hasn't seen a stone pass. Discussed this is a small stone and we may not be able to locate the stone via flouro. Questions were answered to the patient's satisfaction. He elected to proceed. We spoke to pre-op nurse and pt was very weak with motor skills prior to sedation given his prior stroke.      Festus Aloe

## 2018-10-03 NOTE — Discharge Instructions (Signed)
Lithotripsy, Care After °This sheet gives you information about how to care for yourself after your procedure. Your health care provider may also give you more specific instructions. If you have problems or questions, contact your health care provider. °What can I expect after the procedure? °After the procedure, it is common to have: °· Some blood in your urine. This should only last for a few days. °· Soreness in your back, sides, or upper abdomen for a few days. °· Blotches or bruises on your back where the pressure wave entered the skin. °· Pain, discomfort, or nausea when pieces (fragments) of the kidney stone move through the tube that carries urine from the kidney to the bladder (ureter). Stone fragments may pass soon after the procedure, but they may continue to pass for up to 4-8 weeks. °? If you have severe pain or nausea, contact your health care provider. This may be caused by a large stone that was not broken up, and this may mean that you need more treatment. °· Some pain or discomfort during urination. °· Some pain or discomfort in the lower abdomen or (in men) at the base of the penis. °Follow these instructions at home: °Medicines °· Take over-the-counter and prescription medicines only as told by your health care provider. °· If you were prescribed an antibiotic medicine, take it as told by your health care provider. Do not stop taking the antibiotic even if you start to feel better. °· Do not drive for 24 hours if you were given a medicine to help you relax (sedative). °· Do not drive or use heavy machinery while taking prescription pain medicine. °Eating and drinking ° °  ° °· Drink enough water and fluids to keep your urine clear or pale yellow. This helps any remaining pieces of the stone to pass. It can also help prevent new stones from forming. °· Eat plenty of fresh fruits and vegetables. °· Follow instructions from your health care provider about eating and drinking restrictions. You may be  instructed: °? To reduce how much salt (sodium) you eat or drink. Check ingredients and nutrition facts on packaged foods and beverages. °? To reduce how much meat you eat. °· Eat the recommended amount of calcium for your age and gender. Ask your health care provider how much calcium you should have. °General instructions °· Get plenty of rest. °· Most people can resume normal activities 1-2 days after the procedure. Ask your health care provider what activities are safe for you. °· Your health care provider may direct you to lie in a certain position (postural drainage) and tap firmly (percuss) over your kidney area to help stone fragments pass. Follow instructions as told by your health care provider. °· If directed, strain all urine through the strainer that was provided by your health care provider. °? Keep all fragments for your health care provider to see. Any stones that are found may be sent to a medical lab for examination. The stone may be as small as a grain of salt. °· Keep all follow-up visits as told by your health care provider. This is important. °Contact a health care provider if: °· You have pain that is severe or does not get better with medicine. °· You have nausea that is severe or does not go away. °· You have blood in your urine longer than your health care provider told you to expect. °· You have more blood in your urine. °· You have pain during urination that does   not go away.  You urinate more frequently than usual and this does not go away.  You develop a rash or any other possible signs of an allergic reaction. Get help right away if:  You have severe pain in your back, sides, or upper abdomen.  You have severe pain while urinating.  Your urine is very dark red.  You have blood in your stool (feces).  You cannot pass any urine at all.  You feel a strong urge to urinate after emptying your bladder.  You have a fever or chills.  You develop shortness of breath,  difficulty breathing, or chest pain.  You have severe nausea that leads to persistent vomiting.  You faint. Summary  After this procedure, it is common to have some pain, discomfort, or nausea when pieces (fragments) of the kidney stone move through the tube that carries urine from the kidney to the bladder (ureter). If this pain or nausea is severe, however, you should contact your health care provider.  Most people can resume normal activities 1-2 days after the procedure. Ask your health care provider what activities are safe for you.  Drink enough water and fluids to keep your urine clear or pale yellow. This helps any remaining pieces of the stone to pass, and it can help prevent new stones from forming.  If directed, strain your urine and keep all fragments for your health care provider to see. Fragments or stones may be as small as a grain of salt.  Get help right away if you have severe pain in your back, sides, or upper abdomen or have severe pain while urinating. This information is not intended to replace advice given to you by your health care provider. Make sure you discuss any questions you have with your health care provider. Document Released: 06/25/2007 Document Revised: 11/14/2017 Document Reviewed: 04/26/2016 Elsevier Interactive Patient Education  2019 [place] Anesthesia, Adult, Care After This sheet gives you information about how to care for yourself after your procedure. Your health care provider may also give you more specific instructions. If you have problems or questions, contact your health care provider. What can I expect after the procedure? After the procedure, the following side effects are common:  Pain or discomfort at the IV site.  Nausea.  Vomiting.  Sore throat.  Trouble concentrating.  Feeling cold or chills.  Weak or tired.  Sleepiness and fatigue.  Soreness and body aches. These side effects can affect parts of the  body that were not involved in surgery. Follow these instructions at home:  For at least 24 hours after the procedure:  Have a responsible adult stay with you. It is important to have someone help care for you until you are awake and alert.  Rest as needed.  Do not: ? Participate in activities in which you could fall or become injured. ? Drive. ? Use heavy machinery. ? Drink alcohol. ? Take sleeping pills or medicines that cause drowsiness. ? Make important decisions or sign legal documents. ? Take care of children on your own. Eating and drinking  Follow any instructions from your health care provider about eating or drinking restrictions.  When you feel hungry, start by eating small amounts of foods that are soft and easy to digest (bland), such as toast. Gradually return to your regular diet.  Drink enough fluid to keep your urine pale yellow.  If you vomit, rehydrate by drinking water, juice, or clear broth. General instructions  If  you have sleep apnea, surgery and certain medicines can increase your risk for breathing problems. Follow instructions from your health care provider about wearing your sleep device: ? Anytime you are sleeping, including during daytime naps. ? While taking prescription pain medicines, sleeping medicines, or medicines that make you drowsy.  Return to your normal activities as told by your health care provider. Ask your health care provider what activities are safe for you.  Take over-the-counter and prescription medicines only as told by your health care provider.  If you smoke, do not smoke without supervision.  Keep all follow-up visits as told by your health care provider. This is important. Contact a health care provider if:  You have nausea or vomiting that does not get better with medicine.  You cannot eat or drink without vomiting.  You have pain that does not get better with medicine.  You are unable to pass urine.  You develop  a skin rash.  You have a fever.  You have redness around your IV site that gets worse. Get help right away if:  You have difficulty breathing.  You have chest pain.  You have blood in your urine or stool, or you vomit blood. Summary  After the procedure, it is common to have a sore throat or nausea. It is also common to feel tired.  Have a responsible adult stay with you for the first 24 hours after general anesthesia. It is important to have someone help care for you until you are awake and alert.  When you feel hungry, start by eating small amounts of foods that are soft and easy to digest (bland), such as toast. Gradually return to your regular diet.  Drink enough fluid to keep your urine pale yellow.  Return to your normal activities as told by your health care provider. Ask your health care provider what activities are safe for you. This information is not intended to replace advice given to you by your health care provider. Make sure you discuss any questions you have with your health care provider. Document Released: 09/11/2000 Document Revised: 01/19/2017 Document Reviewed: 01/19/2017 Elsevier Interactive Patient Education  2019 [employer].

## 2018-10-03 NOTE — Op Note (Signed)
3 mm Right UVJ stone at the bladder   Right ESWL   Findings: He did well. Stone faded. He may need a staged procedure if the stone/stone fragments do not pass.

## 2018-10-04 ENCOUNTER — Encounter (HOSPITAL_COMMUNITY): Payer: Self-pay | Admitting: Urology

## 2018-10-04 DIAGNOSIS — G629 Polyneuropathy, unspecified: Secondary | ICD-10-CM | POA: Diagnosis not present

## 2018-10-04 DIAGNOSIS — E1140 Type 2 diabetes mellitus with diabetic neuropathy, unspecified: Secondary | ICD-10-CM | POA: Diagnosis not present

## 2018-10-04 DIAGNOSIS — E782 Mixed hyperlipidemia: Secondary | ICD-10-CM | POA: Diagnosis not present

## 2018-10-04 DIAGNOSIS — N183 Chronic kidney disease, stage 3 (moderate): Secondary | ICD-10-CM | POA: Diagnosis not present

## 2018-10-04 DIAGNOSIS — E118 Type 2 diabetes mellitus with unspecified complications: Secondary | ICD-10-CM | POA: Diagnosis not present

## 2018-10-04 DIAGNOSIS — E039 Hypothyroidism, unspecified: Secondary | ICD-10-CM | POA: Diagnosis not present

## 2018-10-04 DIAGNOSIS — E1122 Type 2 diabetes mellitus with diabetic chronic kidney disease: Secondary | ICD-10-CM | POA: Diagnosis not present

## 2018-10-11 DIAGNOSIS — H401133 Primary open-angle glaucoma, bilateral, severe stage: Secondary | ICD-10-CM | POA: Diagnosis not present

## 2018-10-11 DIAGNOSIS — E113593 Type 2 diabetes mellitus with proliferative diabetic retinopathy without macular edema, bilateral: Secondary | ICD-10-CM | POA: Diagnosis not present

## 2018-10-11 DIAGNOSIS — Z961 Presence of intraocular lens: Secondary | ICD-10-CM | POA: Diagnosis not present

## 2018-10-11 DIAGNOSIS — H35033 Hypertensive retinopathy, bilateral: Secondary | ICD-10-CM | POA: Diagnosis not present

## 2018-10-16 DIAGNOSIS — E118 Type 2 diabetes mellitus with unspecified complications: Secondary | ICD-10-CM | POA: Diagnosis not present

## 2018-10-17 DIAGNOSIS — N201 Calculus of ureter: Secondary | ICD-10-CM | POA: Diagnosis not present

## 2018-10-17 DIAGNOSIS — R972 Elevated prostate specific antigen [PSA]: Secondary | ICD-10-CM | POA: Diagnosis not present

## 2018-10-18 DIAGNOSIS — G43009 Migraine without aura, not intractable, without status migrainosus: Secondary | ICD-10-CM | POA: Diagnosis not present

## 2018-10-18 DIAGNOSIS — Z8673 Personal history of transient ischemic attack (TIA), and cerebral infarction without residual deficits: Secondary | ICD-10-CM | POA: Diagnosis not present

## 2018-10-18 DIAGNOSIS — M542 Cervicalgia: Secondary | ICD-10-CM | POA: Diagnosis not present

## 2018-10-30 DIAGNOSIS — E118 Type 2 diabetes mellitus with unspecified complications: Secondary | ICD-10-CM | POA: Diagnosis not present

## 2018-11-01 DIAGNOSIS — E1351 Other specified diabetes mellitus with diabetic peripheral angiopathy without gangrene: Secondary | ICD-10-CM | POA: Diagnosis not present

## 2018-11-01 DIAGNOSIS — L602 Onychogryphosis: Secondary | ICD-10-CM | POA: Diagnosis not present

## 2018-11-13 DIAGNOSIS — E118 Type 2 diabetes mellitus with unspecified complications: Secondary | ICD-10-CM | POA: Diagnosis not present

## 2018-12-27 DIAGNOSIS — E118 Type 2 diabetes mellitus with unspecified complications: Secondary | ICD-10-CM | POA: Diagnosis not present

## 2018-12-27 DIAGNOSIS — E1142 Type 2 diabetes mellitus with diabetic polyneuropathy: Secondary | ICD-10-CM | POA: Diagnosis not present

## 2018-12-27 DIAGNOSIS — I10 Essential (primary) hypertension: Secondary | ICD-10-CM | POA: Diagnosis not present

## 2018-12-27 DIAGNOSIS — E039 Hypothyroidism, unspecified: Secondary | ICD-10-CM | POA: Diagnosis not present

## 2018-12-27 DIAGNOSIS — I639 Cerebral infarction, unspecified: Secondary | ICD-10-CM | POA: Diagnosis not present

## 2018-12-27 DIAGNOSIS — E7800 Pure hypercholesterolemia, unspecified: Secondary | ICD-10-CM | POA: Diagnosis not present

## 2018-12-27 DIAGNOSIS — M545 Low back pain: Secondary | ICD-10-CM | POA: Diagnosis not present

## 2019-01-03 DIAGNOSIS — H401133 Primary open-angle glaucoma, bilateral, severe stage: Secondary | ICD-10-CM | POA: Diagnosis not present

## 2019-01-03 DIAGNOSIS — H462 Nutritional optic neuropathy: Secondary | ICD-10-CM | POA: Diagnosis not present

## 2019-01-30 DIAGNOSIS — E113553 Type 2 diabetes mellitus with stable proliferative diabetic retinopathy, bilateral: Secondary | ICD-10-CM | POA: Diagnosis not present

## 2019-01-30 DIAGNOSIS — H53013 Deprivation amblyopia, bilateral: Secondary | ICD-10-CM | POA: Diagnosis not present

## 2019-01-30 DIAGNOSIS — H401132 Primary open-angle glaucoma, bilateral, moderate stage: Secondary | ICD-10-CM | POA: Diagnosis not present

## 2019-01-30 DIAGNOSIS — H4720 Unspecified optic atrophy: Secondary | ICD-10-CM | POA: Diagnosis not present

## 2019-01-31 DIAGNOSIS — N401 Enlarged prostate with lower urinary tract symptoms: Secondary | ICD-10-CM | POA: Diagnosis not present

## 2019-02-13 DIAGNOSIS — L602 Onychogryphosis: Secondary | ICD-10-CM | POA: Diagnosis not present

## 2019-02-13 DIAGNOSIS — E1351 Other specified diabetes mellitus with diabetic peripheral angiopathy without gangrene: Secondary | ICD-10-CM | POA: Diagnosis not present

## 2019-02-14 DIAGNOSIS — N401 Enlarged prostate with lower urinary tract symptoms: Secondary | ICD-10-CM | POA: Diagnosis not present

## 2019-02-14 DIAGNOSIS — R972 Elevated prostate specific antigen [PSA]: Secondary | ICD-10-CM | POA: Diagnosis not present

## 2019-02-14 DIAGNOSIS — N3943 Post-void dribbling: Secondary | ICD-10-CM | POA: Diagnosis not present

## 2019-02-23 ENCOUNTER — Emergency Department (HOSPITAL_COMMUNITY): Payer: Medicare HMO

## 2019-02-23 ENCOUNTER — Other Ambulatory Visit: Payer: Self-pay

## 2019-02-23 ENCOUNTER — Emergency Department (HOSPITAL_COMMUNITY)
Admission: EM | Admit: 2019-02-23 | Discharge: 2019-02-23 | Disposition: A | Discharge status: Home / Self Care | Payer: Medicare HMO | Attending: Emergency Medicine | Admitting: Emergency Medicine

## 2019-02-23 ENCOUNTER — Encounter (HOSPITAL_COMMUNITY): Payer: Self-pay | Admitting: Emergency Medicine

## 2019-02-23 DIAGNOSIS — R109 Unspecified abdominal pain: Secondary | ICD-10-CM | POA: Diagnosis present

## 2019-02-23 DIAGNOSIS — Z9049 Acquired absence of other specified parts of digestive tract: Secondary | ICD-10-CM | POA: Diagnosis not present

## 2019-02-23 DIAGNOSIS — N400 Enlarged prostate without lower urinary tract symptoms: Secondary | ICD-10-CM | POA: Diagnosis not present

## 2019-02-23 DIAGNOSIS — M419 Scoliosis, unspecified: Secondary | ICD-10-CM | POA: Diagnosis not present

## 2019-02-23 DIAGNOSIS — Z8673 Personal history of transient ischemic attack (TIA), and cerebral infarction without residual deficits: Secondary | ICD-10-CM | POA: Insufficient documentation

## 2019-02-23 DIAGNOSIS — M47817 Spondylosis without myelopathy or radiculopathy, lumbosacral region: Secondary | ICD-10-CM | POA: Diagnosis not present

## 2019-02-23 DIAGNOSIS — K429 Umbilical hernia without obstruction or gangrene: Secondary | ICD-10-CM | POA: Diagnosis not present

## 2019-02-23 DIAGNOSIS — E119 Type 2 diabetes mellitus without complications: Secondary | ICD-10-CM | POA: Diagnosis not present

## 2019-02-23 DIAGNOSIS — Z79899 Other long term (current) drug therapy: Secondary | ICD-10-CM | POA: Insufficient documentation

## 2019-02-23 DIAGNOSIS — K5730 Diverticulosis of large intestine without perforation or abscess without bleeding: Secondary | ICD-10-CM | POA: Diagnosis not present

## 2019-02-23 DIAGNOSIS — R911 Solitary pulmonary nodule: Secondary | ICD-10-CM | POA: Diagnosis not present

## 2019-02-23 DIAGNOSIS — Z794 Long term (current) use of insulin: Secondary | ICD-10-CM | POA: Insufficient documentation

## 2019-02-23 DIAGNOSIS — Z7982 Long term (current) use of aspirin: Secondary | ICD-10-CM | POA: Diagnosis not present

## 2019-02-23 DIAGNOSIS — R300 Dysuria: Secondary | ICD-10-CM | POA: Diagnosis not present

## 2019-02-23 DIAGNOSIS — N12 Tubulo-interstitial nephritis, not specified as acute or chronic: Secondary | ICD-10-CM | POA: Diagnosis not present

## 2019-02-23 DIAGNOSIS — I10 Essential (primary) hypertension: Secondary | ICD-10-CM | POA: Insufficient documentation

## 2019-02-23 DIAGNOSIS — N281 Cyst of kidney, acquired: Secondary | ICD-10-CM | POA: Diagnosis not present

## 2019-02-23 LAB — CBC WITH DIFFERENTIAL/PLATELET
Abs Immature Granulocytes: 0.01 10*3/uL (ref 0.00–0.07)
Basophils Absolute: 0 10*3/uL (ref 0.0–0.1)
Basophils Relative: 0 %
Eosinophils Absolute: 0.1 10*3/uL (ref 0.0–0.5)
Eosinophils Relative: 1 %
HCT: 45.5 % (ref 39.0–52.0)
Hemoglobin: 15.2 g/dL (ref 13.0–17.0)
Immature Granulocytes: 0 %
Lymphocytes Relative: 25 %
Lymphs Abs: 1.6 10*3/uL (ref 0.7–4.0)
MCH: 32.7 pg (ref 26.0–34.0)
MCHC: 33.4 g/dL (ref 30.0–36.0)
MCV: 97.8 fL (ref 80.0–100.0)
Monocytes Absolute: 0.5 10*3/uL (ref 0.1–1.0)
Monocytes Relative: 9 %
Neutro Abs: 4 10*3/uL (ref 1.7–7.7)
Neutrophils Relative %: 65 %
Platelets: 158 10*3/uL (ref 150–400)
RBC: 4.65 MIL/uL (ref 4.22–5.81)
RDW: 14.1 % (ref 11.5–15.5)
WBC: 6.1 10*3/uL (ref 4.0–10.5)
nRBC: 0 % (ref 0.0–0.2)

## 2019-02-23 LAB — COMPREHENSIVE METABOLIC PANEL
ALT: 31 U/L (ref 0–44)
AST: 22 U/L (ref 15–41)
Albumin: 3 g/dL — ABNORMAL LOW (ref 3.5–5.0)
Alkaline Phosphatase: 102 U/L (ref 38–126)
Anion gap: 7 (ref 5–15)
BUN: 22 mg/dL (ref 8–23)
CO2: 23 mmol/L (ref 22–32)
Calcium: 8.7 mg/dL — ABNORMAL LOW (ref 8.9–10.3)
Chloride: 107 mmol/L (ref 98–111)
Creatinine, Ser: 1.38 mg/dL — ABNORMAL HIGH (ref 0.61–1.24)
GFR calc Af Amer: 56 mL/min — ABNORMAL LOW (ref >60)
GFR calc non Af Amer: 48 mL/min — ABNORMAL LOW (ref >60)
Glucose, Bld: 219 mg/dL — ABNORMAL HIGH (ref 70–99)
Potassium: 4 mmol/L (ref 3.5–5.1)
Sodium: 137 mmol/L (ref 135–145)
Total Bilirubin: 0.7 mg/dL (ref 0.3–1.2)
Total Protein: 6.7 g/dL (ref 6.5–8.1)

## 2019-02-23 LAB — URINALYSIS, ROUTINE W REFLEX MICROSCOPIC
Bacteria, UA: NONE SEEN
Bilirubin Urine: NEGATIVE
Glucose, UA: 50 mg/dL — AB
Ketones, ur: NEGATIVE mg/dL
Nitrite: NEGATIVE
Protein, ur: 100 mg/dL — AB
Specific Gravity, Urine: 1.009 (ref 1.005–1.030)
WBC, UA: >50 WBC/hpf — ABNORMAL HIGH (ref 0–5)
pH: 5 (ref 5.0–8.0)

## 2019-02-23 MED ORDER — IOHEXOL 300 MG/ML  SOLN
80.0000 mL | Freq: Once | INTRAMUSCULAR | Status: AC | PRN
  Administered 2019-02-23: 15:00:00 80 mL via INTRAVENOUS

## 2019-02-23 MED ORDER — MORPHINE SULFATE (PF) 4 MG/ML IV SOLN
4.0000 mg | Freq: Once | INTRAVENOUS | Status: AC
  Administered 2019-02-23: 4 mg via INTRAVENOUS
  Filled 2019-02-23: qty 1

## 2019-02-23 MED ORDER — ONDANSETRON HCL 4 MG/2ML IJ SOLN
4.0000 mg | Freq: Four times a day (QID) | INTRAMUSCULAR | Status: DC | PRN
  Administered 2019-02-23: 4 mg via INTRAVENOUS
  Filled 2019-02-23: qty 2

## 2019-02-23 MED ORDER — SODIUM CHLORIDE 0.9 % IV SOLN: INTRAVENOUS | Status: DC

## 2019-02-23 MED ORDER — SODIUM CHLORIDE 0.9 % IV SOLN
1.0000 g | Freq: Once | INTRAVENOUS | Status: AC
  Administered 2019-02-23: 16:00:00 1 g via INTRAVENOUS
  Filled 2019-02-23: qty 10

## 2019-02-23 MED ORDER — CEPHALEXIN 500 MG PO CAPS: 500.0000 mg | ORAL_CAPSULE | Freq: Three times a day (TID) | ORAL | 0 refills | Status: DC

## 2019-02-23 NOTE — ED Notes (Signed)
Patient verbalizes understanding of discharge instructions. Opportunity for questioning and answers were provided. Armband removed by staff, pt discharged from ED.  

## 2019-02-23 NOTE — ED Provider Notes (Signed)
[facility] EMERGENCY DEPARTMENT Provider Note   CSN: [id number] Arrival date & time: 02/23/19  0931     History   Chief Complaint Chief Complaint  Patient presents with   Back Pain   Dysuria    HPI Mike Mack is a 83 y.o. male.     HPI Patient presents concern of flank pain, dysuria. Patient is here with his daughter who assists with the HPI. About 1 week ago the patient had a mechanical column fall, falling backwards, striking his left flank. Since that time he has had worsening persistent pain in the left flank, not improved with ibuprofen, nor with pain tablets that the patient does not know the name of. Pain is sharp, severe, worse with motion. There is associated dysuria, and decreased urinary frequency. Notably, patient does have a history of prostatic hyperplasia, and kidney stones, as well as multiple other medical problems. No fever, no vomiting, no chest pain, no dyspnea. Today with worsening pain he presents for evaluation. Past Medical History:  Diagnosis Date   Anemia 05/13/2013   Arthritis    "right leg" (07/25/2013)   BPH (benign prostatic hyperplasia)    Constipation 05/13/2013   Exertional shortness of breath    "sometimes" (07/25/2013)   History of kidney stones    History of stomach ulcers    Hypertension    Neuropathy    Polio    Polio Childhood   Small bowel obstruction ([place])    Stroke ([place]) 2014   residual:  "left hand kind of numb" (07/25/2013)   Type II diabetes mellitus ([place])     Patient Active Problem List   Diagnosis Date Noted   Syncope 10/25/2014   AKI (acute kidney injury) ([place]) 10/25/2014   H/O: CVA (cerebrovascular accident) 10/25/2014   H/O poliomyelitis 10/25/2014   DM II (diabetes mellitus, type II), controlled ([place]) 10/25/2014   Tachycardia 08/29/2013   Cough 08/29/2013   Monoparesis of leg ([place]) 08/20/2013   Muscle weakness of lower extremity 08/20/2013   Right upper quadrant  abdominal abscess ([place]) 08/15/2013   Hepatic abscess 08/15/2013   Severe sepsis ([place]) 08/15/2013   Subacute delirium 08/14/2013   Hyperglycemia 08/14/2013   Oral thrush 08/14/2013   Leukocytosis, unspecified 08/14/2013   Debility 08/14/2013   S/P cholecystectomy 07/29/2013   Cholecystitis with cholelithiasis 07/25/2013   Arthritis 06/06/2013   Headache(784.0) 06/06/2013   Headache 06/06/2013   Generalized weakness 05/24/2013   Cholecystitis, acute 05/24/2013   RUQ abdominal pain 05/13/2013   Acute cholecystitis: Probable 05/13/2013   Constipation 05/13/2013   Enterococcus UTI 05/13/2013   Leukocytosis 05/13/2013   Anemia 05/13/2013   Stroke ([place]) 04/15/2012   Acute left thalamic CVA (cerebral infarction) 04/10/2012   Diabetes mellitus without complication (HCC)    Hypertension    BPH (benign prostatic hyperplasia)    Pruritic disorder 02/02/2011    Past Surgical History:  Procedure Laterality Date   BOWEL RESECTION     CATARACT EXTRACTION W/ INTRAOCULAR LENS  IMPLANT, BILATERAL Bilateral 2000's   CHOLECYSTECTOMY N/A 07/25/2013   Procedure: LAPAROSCOPIC CHOLECYSTECTOMY WITH INTRAOPERATIVE CHOLANGIOGRAM;  Surgeon: [name], MD;  Location: [place];  Service: General;  Laterality: N/A;   COLON SURGERY     COLONOSCOPY     Hx: of   EXCISIONAL HEMORRHOIDECTOMY  2000's   EXTRACORPOREAL SHOCK WAVE LITHOTRIPSY Right 10/03/2018   Procedure: EXTRACORPOREAL SHOCK WAVE LITHOTRIPSY (ESWL);  Surgeon: Festus Aloe, MD;  Location: [facility];  Service: Urology;  Laterality: Right;   INGUINAL  HERNIA REPAIR Right 1970's   LAPAROSCOPIC CHOLECYSTECTOMY  07/25/2013   w/LOA (07/25/2013)        Home Medications    Prior to Admission medications   Medication Sig Start Date End Date Taking? Authorizing Provider  aspirin 325 MG tablet Take 1 tablet (325 mg total) by mouth daily. 10/04/18   Festus Aloe, MD  atorvastatin (LIPITOR) 20 MG tablet Take 1  tablet by mouth every evening. 11/03/13   [provider]  COMBIGAN 0.2-0.5 % ophthalmic solution Place 1 drop into both eyes 2 (two) times daily. 09/21/14   [provider]  gabapentin (NEURONTIN) 300 MG capsule 2 EVERY MORNING & 2 AT NIGHT. IF NOT TOO SLEEPY GO TO 2 IN THE AM & 3 AT NIGHT AFTER A FEW DAYS 10/07/14   [provider]  glucose blood test strip  04/04/15   [provider]  insulin detemir (LEVEMIR) 100 UNIT/ML injection Inject 10-20 Units into the skin 2 (two) times daily. 10 units in the morning, 20 units in the evening    [provider]  irbesartan (AVAPRO) 300 MG tablet Take 300 mg by mouth daily. 10/07/14   [provider]  latanoprost (XALATAN) 0.005 % ophthalmic solution  11/17/14   [provider]  Levothyroxine Sodium 88 MCG CAPS 88 mcg. Frequency:UNKNOWN   Dosage:0.0     Instructions:  Note:Dose: . 04/21/11   [provider]  Multiple Vitamin (MULTIVITAMIN WITH MINERALS) TABS tablet Take 1 tablet by mouth daily. 09/06/13   [name], MD  polyethylene glycol (MIRALAX / GLYCOLAX) packet Take 17 g by mouth daily. 09/06/13   [name], MD  Tamsulosin HCl (FLOMAX) 0.4 MG CAPS Take 0.4 mg by mouth 2 (two) times daily.     [provider]  Travoprost, BAK Free, (TRAVATAN) 0.004 % SOLN ophthalmic solution Place 1 drop into both eyes at bedtime.    [provider]  VESICARE 5 MG tablet Take 5 mg by mouth daily. 11/06/14   [provider]    Family History Family History  Problem Relation Age of Onset   Heart failure Mother    Diabetes Sister    Diabetes Sister    Diabetes Brother    Hypertension Sister    Hypertension Sister    Hypertension Brother    Heart attack Sister     Social History Social History   Tobacco Use   Smoking status: Never Smoker   Smokeless tobacco: Never Used  Substance Use Topics   Alcohol use: No   Drug use: No      Allergies   Patient has no known allergies.   Review of Systems Review of Systems  Constitutional:       Per HPI, otherwise negative  HENT:       Per HPI, otherwise negative  Respiratory:       Per HPI, otherwise negative  Cardiovascular:       Per HPI, otherwise negative  Gastrointestinal: Negative for vomiting.  Endocrine:       Negative aside from HPI  Genitourinary:       Neg aside from HPI   Musculoskeletal:       Per HPI, otherwise negative  Skin: Negative.   Neurological: Negative for syncope.     Physical Exam Updated Vital Signs BP 138/74    Pulse (!) 53    Temp 98 F (36.7 C) (Oral)    Resp 16    SpO2 98%   Physical Exam  Vitals signs and nursing note reviewed.  Constitutional:      General: He is not in acute distress.    Appearance: He is well-developed.  HENT:     Head: Normocephalic and atraumatic.  Eyes:     Conjunctiva/sclera: Conjunctivae normal.  Cardiovascular:     Rate and Rhythm: Normal rate and regular rhythm.  Pulmonary:     Effort: Pulmonary effort is normal. No respiratory distress.     Breath sounds: No stridor.  Abdominal:     General: There is no distension.     Comments: Patient has tenderness to palpation throughout the left flank without appreciable discoloration, lesion.  Skin:    General: Skin is warm and dry.  Neurological:     Mental Status: He is alert and oriented to person, place, and time.     Motor: Atrophy present.  Psychiatric:        Behavior: Behavior is slowed and withdrawn.      ED Treatments / Results  Labs (all labs ordered are listed, but only abnormal results are displayed) Labs Reviewed  COMPREHENSIVE METABOLIC PANEL - Abnormal; Notable for the following components:      Result Value   Glucose, Bld 219 (*)    Creatinine, Ser 1.38 (*)    Calcium 8.7 (*)    Albumin 3.0 (*)    GFR calc non Af Amer 48 (*)    GFR calc Af Amer 56 (*)    All other components within normal limits  URINALYSIS,  ROUTINE W REFLEX MICROSCOPIC - Abnormal; Notable for the following components:   APPearance CLOUDY (*)    Glucose, UA 50 (*)    Hgb urine dipstick MODERATE (*)    Protein, ur 100 (*)    Leukocytes,Ua LARGE (*)    WBC, UA >50 (*)    All other components within normal limits  CBC WITH DIFFERENTIAL/PLATELET     Radiology Ct Abdomen Pelvis W Contrast  Result Date: 02/23/2019 CLINICAL DATA:  Pt arrives to ED from home with complaints of a fall in his shower causing pain to his left flank. Patient denies any trauma to head or LOC. Since then the patient has had pain on urination and intense pain to his left flank starting 2 days ago. EXAM: CT ABDOMEN AND PELVIS WITH CONTRAST TECHNIQUE: Multidetector CT imaging of the abdomen and pelvis was performed using the standard protocol following bolus administration of intravenous contrast. CONTRAST:  56mL OMNIPAQUE IOHEXOL 300 MG/ML  SOLN COMPARISON:  09/19/2018 FINDINGS: Lower chest: Stable 4 millimeter nodule is identified within the RIGHT LOWER lobe on image 4/4. There is minimal bibasilar atelectasis. Stable elevation of LEFT hemidiaphragm. There is coronary artery atherosclerosis. Hepatobiliary: The liver is normal in appearance. Cholecystectomy. Pancreas: Unremarkable. No pancreatic ductal dilatation or surrounding inflammatory changes. Spleen: Normal in size without focal abnormality. Adrenals/Urinary Tract: Adrenal glands are normal in appearance. Numerous bilateral renal cysts are stable in appearance. Ureters are unremarkable. Prominent impression on the base of the bladder by the enlarged prostate. Stomach/Bowel: Stomach and small bowel loops are normal in appearance. There are scattered colonic diverticula. No acute diverticulitis. The appendix is well seen and has a normal appearance. Vascular/Lymphatic: No significant vascular findings are present. No enlarged abdominal or pelvic lymph nodes. Reproductive: Enlarged, heterogeneous prostate. Other: Fat  containing paraumbilical hernia. Musculoskeletal: No acute fracture. Degenerative changes are seen in the lumbosacral spine. Scoliosis. Fatty replaced RIGHT hip abductors. IMPRESSION: 1. No evidence for acute abnormality of the abdomen or pelvis. 2.  Coronary artery disease. 3. Cholecystectomy. 4. Numerous bilateral renal cysts.  No urinary tract obstruction. 5. Enlarged, heterogeneous prostate. 6. Colonic diverticulosis. 7. Fat containing paraumbilical hernia. 8. No acute fracture. Electronically Signed   By: Nolon Nations M.D.   On: 02/23/2019 15:55    Procedures Procedures (including critical care time)  Medications Ordered in ED Medications  0.9 %  sodium chloride infusion (has no administration in time range)  morphine 4 MG/ML injection 4 mg (has no administration in time range)  ondansetron (ZOFRAN) injection 4 mg (has no administration in time range)     Initial Impression / Assessment and Plan / ED Course  I have reviewed the triage vital signs and the nursing notes.  Pertinent labs & imaging results that were available during my care of the patient were reviewed by me and considered in my medical decision making (see chart for details).  Patient in similar condition on repeat exam.  This elderly male presents several days after a fall, with subsequent left flank pain, dysuria, microscopic hematuria, given these considerations patient had concern for renal injury. Patient's initial labs generally reassuring aside from blood in urine, possible infection as well. CT reassuring, on chart review, labs otherwise unremarkable, the patient was discharged with antibiotics.   Final Clinical Impressions(s) / ED Diagnoses   Final diagnoses:  Pyelonephritis    ED Discharge Orders         Ordered    cephALEXin (KEFLEX) 500 MG capsule  3 times daily     02/23/19 1736           Carmin Muskrat, MD 02/24/19 1531

## 2019-02-23 NOTE — Discharge Instructions (Addendum)
Take keflex three times daily for a week  Stay hydrated   Take tylenol, motrin for pain   Return to ER if you have worse flank pain, vomiting, fever

## 2019-02-23 NOTE — ED Provider Notes (Signed)
  Physical Exam  BP 114/69   Pulse (!) 52   Temp 98 F (36.7 C) (Oral)   Resp 16   SpO2 98%   Physical Exam  ED Course/Procedures     Procedures  MDM  Care assumed at 3 pm. Patient has L flank pain and some dysuria and did have a fall. UA + UTI. CT ab/pel pending to r/o RP bleed.   5:35 PM CT showed renal cysts with no hydro and no RP bleed. Hemodynamically stable. Will dc home with Cristine Polio, MD 02/23/19 [phone]

## 2019-02-23 NOTE — ED Triage Notes (Addendum)
Pt arrives to ED from home with complaints of a fall in his shower causing pain to his left flank. Patient denies any trauma to head or LOC. Since then the patient has had pain on urination and intense pain to his left flank starting two days ago.

## 2019-02-28 DIAGNOSIS — H462 Nutritional optic neuropathy: Secondary | ICD-10-CM | POA: Diagnosis not present

## 2019-02-28 DIAGNOSIS — H401133 Primary open-angle glaucoma, bilateral, severe stage: Secondary | ICD-10-CM | POA: Diagnosis not present

## 2019-02-28 DIAGNOSIS — E113593 Type 2 diabetes mellitus with proliferative diabetic retinopathy without macular edema, bilateral: Secondary | ICD-10-CM | POA: Diagnosis not present

## 2019-03-23 ENCOUNTER — Emergency Department (HOSPITAL_COMMUNITY)
Admission: EM | Admit: 2019-03-23 | Discharge: 2019-03-23 | Disposition: A | Discharge status: Home / Self Care | Payer: Medicare HMO | Attending: Emergency Medicine | Admitting: Emergency Medicine

## 2019-03-23 ENCOUNTER — Other Ambulatory Visit: Payer: Self-pay

## 2019-03-23 ENCOUNTER — Encounter (HOSPITAL_COMMUNITY): Payer: Self-pay

## 2019-03-23 DIAGNOSIS — I10 Essential (primary) hypertension: Secondary | ICD-10-CM | POA: Insufficient documentation

## 2019-03-23 DIAGNOSIS — E119 Type 2 diabetes mellitus without complications: Secondary | ICD-10-CM | POA: Diagnosis not present

## 2019-03-23 DIAGNOSIS — N472 Paraphimosis: Secondary | ICD-10-CM

## 2019-03-23 DIAGNOSIS — R2240 Localized swelling, mass and lump, unspecified lower limb: Secondary | ICD-10-CM | POA: Diagnosis present

## 2019-03-23 DIAGNOSIS — N471 Phimosis: Secondary | ICD-10-CM | POA: Diagnosis not present

## 2019-03-23 MED ORDER — HYDROMORPHONE HCL 1 MG/ML IJ SOLN
1.0000 mg | Freq: Once | INTRAMUSCULAR | Status: AC
  Administered 2019-03-23: 18:00:00 1 mg via INTRAMUSCULAR
  Filled 2019-03-23: qty 1

## 2019-03-23 MED ORDER — LIDOCAINE HCL 2 % IJ SOLN
20.0000 mL | Freq: Once | INTRAMUSCULAR | Status: AC
  Administered 2019-03-23: 400 mg
  Filled 2019-03-23: qty 20

## 2019-03-23 NOTE — ED Triage Notes (Signed)
Pt states that he has had swelling in his groin x 3 days. Pt denies any issues with urination.

## 2019-03-23 NOTE — Discharge Instructions (Addendum)
You were evaluated in the Emergency Department and after careful evaluation, we did not find any emergent condition requiring admission or further testing in the hospital.  Your exam/testing today was overall reassuring.  The specialists were able to reduce your paraphimosis here in the emergency department.  They do not feel you need any medications at this time.  Please follow-up with Dr. Gloriann Loan in his office.  Please return to the Emergency Department if you experience any worsening of your condition.  We encourage you to follow up with a primary care provider.  Thank you for allowing Mike Mack to be a part of your care.

## 2019-03-23 NOTE — ED Notes (Signed)
Wife at bedside.

## 2019-03-23 NOTE — ED Provider Notes (Signed)
Mike Mack MRN:  [id number]  Arrival date & time: 03/23/19     Chief Complaint   Groin Swelling   History of Present Illness   Mike Mack is a 83 y.o. year-old male with a history of diabetes presenting to the ED with chief complaint of groin swelling.  Pain and swelling to the penis for the past 3 days.  Moderate discomfort.  Mild and occasional pain/burning with urination.  Denies fever, no chest pain, no abdominal pain, no testicular pain, no other complaints.  Review of Systems  A complete 10 system review of systems was obtained and all systems are negative except as noted in the HPI and PMH.   Patient's Health History    Past Medical History:  Diagnosis Date  . Anemia 05/13/2013  . Arthritis    "right leg" (07/25/2013)  . BPH (benign prostatic hyperplasia)   . Constipation 05/13/2013  . Exertional shortness of breath    "sometimes" (07/25/2013)  . History of kidney stones   . History of stomach ulcers   . Hypertension   . Neuropathy   . Polio   . Polio Childhood  . Small bowel obstruction ([place])   . Stroke [facility]) 2014   residual:  "left hand kind of numb" (07/25/2013)  . Type II diabetes mellitus ([place])     Past Surgical History:  Procedure Laterality Date  . BOWEL RESECTION    . CATARACT EXTRACTION W/ INTRAOCULAR LENS  IMPLANT, BILATERAL Bilateral 2000's  . CHOLECYSTECTOMY N/A 07/25/2013   Procedure: LAPAROSCOPIC CHOLECYSTECTOMY WITH INTRAOPERATIVE CHOLANGIOGRAM;  Surgeon: [name], MD;  Location: [place];  Service: General;  Laterality: N/A;  . COLON SURGERY    . COLONOSCOPY     Hx: of  . EXCISIONAL HEMORRHOIDECTOMY  2000's  . EXTRACORPOREAL SHOCK WAVE LITHOTRIPSY Right 10/03/2018   Procedure: EXTRACORPOREAL SHOCK WAVE LITHOTRIPSY (ESWL);  Surgeon: Festus Aloe, MD;  Location[facility];  Service: Urology;  Laterality: Right;  . INGUINAL HERNIA REPAIR Right 1970's  . LAPAROSCOPIC CHOLECYSTECTOMY   07/25/2013   w/LOA (07/25/2013)    Family History  Problem Relation Age of Onset  . Heart failure Mother   . Diabetes Sister   . Diabetes Sister   . Diabetes Brother   . Hypertension Sister   . Hypertension Sister   . Hypertension Brother   . Heart attack Sister     Social History   Socioeconomic History  . Marital status: Married    Spouse name: Mike Mack  . Number of children: 2  . Years of education: Not on file  . Highest education level: Not on file  Occupational History  . Occupation: Retired, Diplomatic Services operational officer  . Financial resource strain: Not on file  . Food insecurity    Worry: Not on file    Inability: Not on file  . Transportation needs    Medical: Not on file    Non-medical: Not on file  Tobacco Use  . Smoking status: Never Smoker  . Smokeless tobacco: Never Used  Substance and Sexual Activity  . Alcohol use: No  . Drug use: No  . Sexual activity: Yes  Lifestyle  . Physical activity    Days per week: Not on file    Minutes per session: Not on file  . Stress: Not on file  Relationships  . Social Herbalist on phone: Not on file    Gets together: Not on file  Attends religious service: Not on file    Active member of club or organization: Not on file    Attends meetings of clubs or organizations: Not on file    Relationship status: Not on file  . Intimate partner violence    Fear of current or ex partner: Not on file    Emotionally abused: Not on file    Physically abused: Not on file    Forced sexual activity: Not on file  Other Topics Concern  . Not on file  Social History Narrative   Married.  Lives in [place] with wife.  Ambulates with a cane.     Physical Exam  Vital Signs and Nursing Notes reviewed Vitals:   03/23/19 1830 03/23/19 1900  BP: 136/71 120/82  Pulse: (!) 58 (!) 53  Resp: 16 16  Temp:    SpO2: 96% 93%    CONSTITUTIONAL: Well-appearing, NAD NEURO:  Alert and oriented x 3, no focal deficits EYES:   eyes equal and reactive ENT/NECK:  no LAD, no JVD CARDIO: Regular rate, well-perfused, normal S1 and S2 PULM:  CTAB no wheezing or rhonchi GI/GU:  normal bowel sounds, non-distended, non-tender; nontender testes and scrotum, paraphimosis evident on exam with white discharge beneath the foreskin MSK/SPINE:  No gross deformities, no edema SKIN:  no rash, atraumatic PSYCH:  Appropriate speech and behavior  Diagnostic and Interventional Summary    EKG Interpretation  Date/Time:    Ventricular Rate:    PR Interval:    QRS Duration:   QT Interval:    QTC Calculation:   R Axis:     Text Interpretation:        Labs Reviewed  URINALYSIS, ROUTINE W REFLEX MICROSCOPIC    No orders to display    Medications  lidocaine (XYLOCAINE) 2 % (with pres) injection 400 mg (400 mg Other Given by Other 03/23/19 1824)  HYDROmorphone (DILAUDID) injection 1 mg (1 mg Intramuscular Given 03/23/19 1802)     .Nerve Block  Date/Time: 03/23/2019 6:30 PM Performed by: Maudie Flakes, MD Authorized by: Maudie Flakes, MD   Consent:    Consent obtained:  Verbal   Consent given by:  Patient   Risks discussed:  Bleeding, infection, allergic reaction, pain and unsuccessful block   Alternatives discussed:  No treatment Indications:    Indications:  Procedural anesthesia Location:    Nerve block body site: Dorsal penile nerve block.   Laterality:  Bilateral Pre-procedure details:    Skin preparation:  2% chlorhexidine   Preparation: Patient was prepped and draped in usual sterile fashion   Procedure details (see MAR for exact dosages):    Block needle gauge:  25 G   Injection procedure:  Anatomic landmarks identified, incremental injection, negative aspiration for blood and anatomic landmarks palpated   Paresthesia:  None Post-procedure details:    Dressing:  None   Outcome:  Pain improved   Patient tolerance of procedure:  Tolerated well, no immediate complications  Paraphimosis reduction   Date/Time: 03/23/2019 6:48 PM Performed by: Maudie Flakes, MD Authorized by: Maudie Flakes, MD  Consent: Verbal consent obtained. Risks and benefits: risks, benefits and alternatives were discussed Consent given by: patient Patient identity confirmed: verbally with patient Time out: Immediately prior to procedure a "time out" was called to verify the correct patient, procedure, equipment, support staff and site/side marked as required. Local anesthesia used: yes Anesthesia: nerve block  Anesthesia: Local anesthesia used: yes Anesthetic total: 15 mL  Sedation: Patient sedated: no  Patient tolerance: patient tolerated the procedure well with no immediate complications Comments: Unsuccessful reduction attempt despite several minutes of constant firm pressure    Critical Care  ED Course and Medical Decision Making  I have reviewed the triage vital signs and the nursing notes.  Pertinent labs & imaging results that were available during my care of the patient were reviewed by me and considered in my medical decision making (see below for details).  Consistent with balanitis and paraphimosis, normal vital signs, no ischemia or necrosis.  Discussed with Dr. Gloriann Loan of urology, will attempt dorsal nerve block and reduction.  6:45 PM update: Nerve block as described above provides not complete anesthesia but good relief, facilitating reduction attempt which is unfortunately unsuccessful.  Urology reconsulted, Dr. Diona Fanti will come to evaluate the patient.  Reduced by urology, who does not feel patient needs medical treatment for balanitis at this time.  Patient will follow-up with urology.  Barth Kirks. Sedonia Small, [place] [email or web address]  Final Clinical Impressions(s) / ED Diagnoses     ICD-10-CM   1. Paraphimosis  N47.2     ED Discharge Orders    None      Discharge Instructions Discussed with and Provided to Patient:    Discharge Instructions     You were evaluated in the Emergency Department and after careful evaluation, we did not find any emergent condition requiring admission or further testing in the hospital.  Your exam/testing today was overall reassuring.  The specialists were able to reduce your paraphimosis here in the emergency department.  They do not feel you need any medications at this time.  Please follow-up with Dr. Gloriann Loan in his office.  Please return to the Emergency Department if you experience any worsening of your condition.  We encourage you to follow up with a primary care provider.  Thank you for allowing [place] to be a part of your care.        Maudie Flakes, MD 03/23/19 [phone]

## 2019-03-23 NOTE — Consult Note (Signed)
Urology Consult  Consulting MD: Viona Gilmore, MD  CC: Penile pain  HPI: This is a 83year old male, diabetic who presented to the emergency room tonight with a painful penis.  He was found to have paraphimosis.  Urology was consulted over the phone and it was recommended that a dorsal penile block and penile compression and attempted reduction be performed.  This was attempted by Dr. Sedonia Small and was unsuccessful.  Urology was consulted to see the patient.  The patient has had this process going on for about a week.  He denies prior issues with his foreskin although it has been difficult retracting this at times.  PMH: Past Medical History:  Diagnosis Date  . Anemia 05/13/2013  . Arthritis    "right leg" (07/25/2013)  . BPH (benign prostatic hyperplasia)   . Constipation 05/13/2013  . Exertional shortness of breath    "sometimes" (07/25/2013)  . History of kidney stones   . History of stomach ulcers   . Hypertension   . Neuropathy   . Polio   . Polio Childhood  . Small bowel obstruction ([place])   . Stroke [facility]) 2014   residual:  "left hand kind of numb" (07/25/2013)  . Type II diabetes mellitus (HCC)     PSH: Past Surgical History:  Procedure Laterality Date  . BOWEL RESECTION    . CATARACT EXTRACTION W/ INTRAOCULAR LENS  IMPLANT, BILATERAL Bilateral 2000's  . CHOLECYSTECTOMY N/A 07/25/2013   Procedure: LAPAROSCOPIC CHOLECYSTECTOMY WITH INTRAOPERATIVE CHOLANGIOGRAM;  Surgeon: [name], MD;  Location: [place];  Service: General;  Laterality: N/A;  . COLON SURGERY    . COLONOSCOPY     Hx: of  . EXCISIONAL HEMORRHOIDECTOMY  2000's  . EXTRACORPOREAL SHOCK WAVE LITHOTRIPSY Right 10/03/2018   Procedure: EXTRACORPOREAL SHOCK WAVE LITHOTRIPSY (ESWL);  Surgeon: Festus Aloe, MD;  Location: [facility];  Service: Urology;  Laterality: Right;  . INGUINAL HERNIA REPAIR Right 1970's  . LAPAROSCOPIC CHOLECYSTECTOMY  07/25/2013   w/LOA (07/25/2013)    Allergies: Allergies  Allergen Reactions  .  Metformin And Related Other (See Comments)    Gi intolerance  . [name] [Insulin Glargine-Lixisenatide] Other (See Comments)    Stomach cramps    Medications: (Not in a hospital admission)    Social History: Social History   Socioeconomic History  . Marital status: Married    Spouse name: Khabir Schettini  . Number of children: 2  . Years of education: Not on file  . Highest education level: Not on file  Occupational History  . Occupation: Retired, Diplomatic Services operational officer  . Financial resource strain: Not on file  . Food insecurity    Worry: Not on file    Inability: Not on file  . Transportation needs    Medical: Not on file    Non-medical: Not on file  Tobacco Use  . Smoking status: Never Smoker  . Smokeless tobacco: Never Used  Substance and Sexual Activity  . Alcohol use: No  . Drug use: No  . Sexual activity: Yes  Lifestyle  . Physical activity    Days per week: Not on file    Minutes per session: Not on file  . Stress: Not on file  Relationships  . Social Herbalist on phone: Not on file    Gets together: Not on file    Attends religious service: Not on file    Active member of club or organization: Not on file    Attends meetings of  clubs or organizations: Not on file    Relationship status: Not on file  . Intimate partner violence    Fear of current or ex partner: Not on file    Emotionally abused: Not on file    Physically abused: Not on file    Forced sexual activity: Not on file  Other Topics Concern  . Not on file  Social History Narrative   Married.  Lives in [place] with wife.  Ambulates with a cane.    Family History: Family History  Problem Relation Age of Onset  . Heart failure Mother   . Diabetes Sister   . Diabetes Sister   . Diabetes Brother   . Hypertension Sister   . Hypertension Sister   . Hypertension Brother   . Heart attack Sister     Review of Systems: Positive: Penile pain, swelling Negative: .  A  further 10 point review of systems was negative except what is listed in the HPI.  Physical Exam: @VITALS2 @ General: No acute distress.  Awake. Head:  Normocephalic.  Atraumatic. ENT:  EOMI.  Mucous membranes moist Neck:  Supple.  CV:  Regular rate. Pulmonary: Equal effort bilaterally.   Abdomen: Soft.  Non-tender to palpation. Skin:  Normal turgor.  No visible rash. Extremity: No gross deformity of upper extremities.  No gross deformity of lower extremities. Neurologic: Alert. Appropriate mood.  Penis:  Uncircumcised.  Paraphimosis present. Urethra: Orthotopic meatus. Scrotum: No lesions.  No ecchymosis.  No erythema. Testicles: Descended bilaterally.  No masses bilaterally. Epididymis: Palpable bilaterally.  Non Tender to palpation.  Studies:  No results for input(s): HGB, WBC, PLT in the last 72 hours.  No results for input(s): NA, K, CL, CO2, BUN, CREATININE, CALCIUM, GFRNONAA, GFRAA in the last 72 hours.  Invalid input(s): MAGNESIUM   No results for input(s): INR, APTT in the last 72 hours.  Invalid input(s): PT   Invalid input(s): ABG  After informed consent, I performed compression of the distal penis.  Following this, the paraphimosis was reduced.  Patient tolerated this well.  Assessment: Paraphimosis, reduced  Plan: The patient normally sees Dr. Gloriann Loan in his office.  I would recommend that he follow-up with him for consultation regarding possible circumcision.  I do not think he needs any current medical therapy.    Pager:[phone]

## 2019-03-28 DIAGNOSIS — I10 Essential (primary) hypertension: Secondary | ICD-10-CM | POA: Diagnosis not present

## 2019-03-28 DIAGNOSIS — E118 Type 2 diabetes mellitus with unspecified complications: Secondary | ICD-10-CM | POA: Diagnosis not present

## 2019-03-28 DIAGNOSIS — E789 Disorder of lipoprotein metabolism, unspecified: Secondary | ICD-10-CM | POA: Diagnosis not present

## 2019-04-01 DIAGNOSIS — R3915 Urgency of urination: Secondary | ICD-10-CM | POA: Diagnosis not present

## 2019-04-01 DIAGNOSIS — N401 Enlarged prostate with lower urinary tract symptoms: Secondary | ICD-10-CM | POA: Diagnosis not present

## 2019-04-01 DIAGNOSIS — R351 Nocturia: Secondary | ICD-10-CM | POA: Diagnosis not present

## 2019-04-01 DIAGNOSIS — N472 Paraphimosis: Secondary | ICD-10-CM | POA: Diagnosis not present

## 2019-04-04 DIAGNOSIS — Z7189 Other specified counseling: Secondary | ICD-10-CM | POA: Diagnosis not present

## 2019-04-04 DIAGNOSIS — I639 Cerebral infarction, unspecified: Secondary | ICD-10-CM | POA: Diagnosis not present

## 2019-04-04 DIAGNOSIS — E11319 Type 2 diabetes mellitus with unspecified diabetic retinopathy without macular edema: Secondary | ICD-10-CM | POA: Diagnosis not present

## 2019-04-04 DIAGNOSIS — I10 Essential (primary) hypertension: Secondary | ICD-10-CM | POA: Diagnosis not present

## 2019-04-04 DIAGNOSIS — E118 Type 2 diabetes mellitus with unspecified complications: Secondary | ICD-10-CM | POA: Diagnosis not present

## 2019-04-04 DIAGNOSIS — E039 Hypothyroidism, unspecified: Secondary | ICD-10-CM | POA: Diagnosis not present

## 2019-04-25 DIAGNOSIS — Z8673 Personal history of transient ischemic attack (TIA), and cerebral infarction without residual deficits: Secondary | ICD-10-CM | POA: Diagnosis not present

## 2019-04-25 DIAGNOSIS — M542 Cervicalgia: Secondary | ICD-10-CM | POA: Diagnosis not present

## 2019-04-25 DIAGNOSIS — R29898 Other symptoms and signs involving the musculoskeletal system: Secondary | ICD-10-CM | POA: Diagnosis not present

## 2019-04-25 DIAGNOSIS — G43009 Migraine without aura, not intractable, without status migrainosus: Secondary | ICD-10-CM | POA: Diagnosis not present

## 2019-04-25 DIAGNOSIS — G14 Postpolio syndrome: Secondary | ICD-10-CM | POA: Diagnosis not present

## 2019-05-22 DIAGNOSIS — E1351 Other specified diabetes mellitus with diabetic peripheral angiopathy without gangrene: Secondary | ICD-10-CM | POA: Diagnosis not present

## 2019-05-22 DIAGNOSIS — L602 Onychogryphosis: Secondary | ICD-10-CM | POA: Diagnosis not present

## 2019-05-28 DIAGNOSIS — K5900 Constipation, unspecified: Secondary | ICD-10-CM | POA: Diagnosis not present

## 2019-05-28 DIAGNOSIS — R399 Unspecified symptoms and signs involving the genitourinary system: Secondary | ICD-10-CM | POA: Diagnosis not present

## 2019-06-20 ENCOUNTER — Ambulatory Visit
Admission: EM | Admit: 2019-06-20 | Discharge: 2019-06-20 | Disposition: A | Discharge status: Home / Self Care | Payer: Medicare HMO

## 2019-06-20 ENCOUNTER — Encounter: Payer: Self-pay | Admitting: Emergency Medicine

## 2019-06-20 ENCOUNTER — Other Ambulatory Visit: Payer: Self-pay

## 2019-06-20 DIAGNOSIS — I10 Essential (primary) hypertension: Secondary | ICD-10-CM

## 2019-06-20 DIAGNOSIS — H5789 Other specified disorders of eye and adnexa: Secondary | ICD-10-CM | POA: Diagnosis not present

## 2019-06-20 DIAGNOSIS — H01004 Unspecified blepharitis left upper eyelid: Secondary | ICD-10-CM

## 2019-06-20 NOTE — ED Triage Notes (Signed)
PT presents to [facility] for assessment of left eye soreness, with redness and swelling, since yesterday.  Denies known injury.

## 2019-06-20 NOTE — Discharge Instructions (Signed)
Use hot compress x 10 min, 3-5 times a day for the next week. Follow up with eye doctor next week. Return if you develop worsening redness, pain, change in vision.

## 2019-06-20 NOTE — ED Provider Notes (Addendum)
[facility]    CSN: [id number] Arrival date & time: 06/20/19  1101      History   Chief Complaint Chief Complaint  Patient presents with  . Eye Pain    HPI Mike Mack is a 84 y.o. male with history of hypertension, type 2 diabetes presenting for left eye soreness, redness, swelling since last night.  Tried OTC eyedrops without significant relief.  Denies change in vision, trauma to area, foreign body sensation.  Patient does wear glasses, denies contact lens use.   Past Medical History:  Diagnosis Date  . Anemia 05/13/2013  . Arthritis    "right leg" (07/25/2013)  . BPH (benign prostatic hyperplasia)   . Constipation 05/13/2013  . Exertional shortness of breath    "sometimes" (07/25/2013)  . History of kidney stones   . History of stomach ulcers   . Hypertension   . Neuropathy   . Polio   . Polio Childhood  . Small bowel obstruction ([place])   . Stroke [facility]) 2014   residual:  "left hand kind of numb" (07/25/2013)  . Type II diabetes mellitus [facility])     Patient Active Problem List   Diagnosis Date Noted  . Syncope 10/25/2014  . AKI (acute kidney injury) ([place]) 10/25/2014  . H/O: CVA (cerebrovascular accident) 10/25/2014  . H/O poliomyelitis 10/25/2014  . DM II (diabetes mellitus, type II), controlled ([place]) 10/25/2014  . Tachycardia 08/29/2013  . Cough 08/29/2013  . Monoparesis of leg ([place]) 08/20/2013  . Muscle weakness of lower extremity 08/20/2013  . Right upper quadrant abdominal abscess ([place]) 08/15/2013  . Hepatic abscess 08/15/2013  . Severe sepsis ([place]) 08/15/2013  . Subacute delirium 08/14/2013  . Hyperglycemia 08/14/2013  . Oral thrush 08/14/2013  . Leukocytosis, unspecified 08/14/2013  . Debility 08/14/2013  . S/P cholecystectomy 07/29/2013  . Cholecystitis with cholelithiasis 07/25/2013  . Arthritis 06/06/2013  . Headache(784.0) 06/06/2013  . Headache 06/06/2013  . Generalized weakness 05/24/2013  . Cholecystitis, acute 05/24/2013  . RUQ  abdominal pain 05/13/2013  . Acute cholecystitis: Probable 05/13/2013  . Constipation 05/13/2013  . Enterococcus UTI 05/13/2013  . Leukocytosis 05/13/2013  . Anemia 05/13/2013  . Stroke ([place]) 04/15/2012  . Acute left thalamic CVA (cerebral infarction) 04/10/2012  . Diabetes mellitus without complication ([place])   . Hypertension   . BPH (benign prostatic hyperplasia)   . Pruritic disorder 02/02/2011    Past Surgical History:  Procedure Laterality Date  . BOWEL RESECTION    . CATARACT EXTRACTION W/ INTRAOCULAR LENS  IMPLANT, BILATERAL Bilateral 2000's  . CHOLECYSTECTOMY N/A 07/25/2013   Procedure: LAPAROSCOPIC CHOLECYSTECTOMY WITH INTRAOPERATIVE CHOLANGIOGRAM;  Surgeon: [name], MD;  Location: [place];  Service: General;  Laterality: N/A;  . COLON SURGERY    . COLONOSCOPY     Hx: of  . EXCISIONAL HEMORRHOIDECTOMY  2000's  . EXTRACORPOREAL SHOCK WAVE LITHOTRIPSY Right 10/03/2018   Procedure: EXTRACORPOREAL SHOCK WAVE LITHOTRIPSY (ESWL);  Surgeon: Festus Aloe, MD;  [facility];  Service: Urology;  Laterality: Right;  . INGUINAL HERNIA REPAIR Right 1970's  . LAPAROSCOPIC CHOLECYSTECTOMY  07/25/2013   w/LOA (07/25/2013)       Home Medications    Prior to Admission medications   Medication Sig Start Date End Date Taking? Authorizing Provider  aspirin EC 81 MG tablet Take 81 mg by mouth daily.    [provider]  atorvastatin (LIPITOR) 20 MG tablet Take 1 tablet by mouth every evening. 11/03/13   [provider]  candesartan (ATACAND) 16  MG tablet Take 16 mg by mouth daily.  09/26/18   [provider]  cholecalciferol (VITAMIN D) 25 MCG (1000 UT) tablet Take 1,000 Units by mouth daily.    [provider]  clotrimazole-betamethasone (LOTRISONE) lotion Apply 1 application topically 2 (two) times daily.    [provider]  COMBIGAN 0.2-0.5 % ophthalmic solution Place 1 drop into both eyes 2 (two) times daily. 09/21/14   [provider]  gabapentin (NEURONTIN) 800 MG tablet Take 800 mg by mouth 2 (two) times daily.  10/07/14   [provider]  glucose blood test strip  04/04/15   [provider]  insulin aspart (NOVOLOG) 100 UNIT/ML injection Inject 6 Units into the skin daily.    [provider]  insulin detemir (LEVEMIR) 100 UNIT/ML injection Inject 18 Units into the skin 2 (two) times daily.     [provider]  latanoprost (XALATAN) 0.005 % ophthalmic solution Place 1 drop into both eyes at bedtime.  11/17/14   [provider]  Levothyroxine Sodium 88 MCG CAPS Take 88 mcg by mouth daily.  04/21/11   [provider]  metoprolol tartrate (LOPRESSOR) 25 MG tablet Take 25 mg by mouth 2 (two) times daily.    [provider]  Multiple Vitamin (MULTIVITAMIN WITH MINERALS) TABS tablet Take 1 tablet by mouth daily. Patient not taking: Reported on 03/23/2019 09/06/13   [name], MD  pimecrolimus (ELIDEL) 1 % cream Apply 1 application topically 2 (two) times daily. Apply to arms and legs    [provider]  pioglitazone (ACTOS) 15 MG tablet Take 15 mg by mouth 2 (two) times daily.     [provider]  polyethylene glycol (MIRALAX / GLYCOLAX) packet Take 17 g by mouth daily. Patient taking differently: Take 17 g by mouth daily as needed for mild constipation.  09/06/13   [name], MD  Tamsulosin HCl (FLOMAX) 0.4 MG CAPS Take 0.4 mg by mouth 2 (two) times daily.     [provider]  Travoprost, BAK Free, (TRAVATAN) 0.004 % SOLN ophthalmic solution Place 1 drop into both eyes daily.     [provider]  vitamin B-12 (CYANOCOBALAMIN) 1000 MCG tablet Take 1,000 mcg by mouth daily.    [provider]    Family History Family History  Problem Relation Age of Onset  . Heart failure Mother   . Diabetes Sister   . Diabetes Sister   . Diabetes Brother   . Hypertension Sister   . Hypertension Sister   .  Hypertension Brother   . Heart attack Sister     Social History Social History   Tobacco Use  . Smoking status: Never Smoker  . Smokeless tobacco: Never Used  Substance Use Topics  . Alcohol use: No  . Drug use: No     Allergies   Metformin and related and Soliqua [insulin glargine-lixisenatide]   Review of Systems Review of Systems  Constitutional: Negative for fatigue and fever.  HENT: Negative for congestion, dental problem, ear pain, facial swelling, hearing loss, sinus pain, sore throat, trouble swallowing and voice change.   Eyes: Positive for pain and redness. Negative for photophobia, discharge, itching and visual disturbance.  Respiratory: Negative for cough and shortness of breath.   Cardiovascular: Negative for chest pain and palpitations.  Gastrointestinal: Negative for diarrhea and vomiting.  Musculoskeletal: Negative for arthralgias and myalgias.  Neurological: Negative for dizziness and headaches.     Physical Exam Triage Vital  Signs ED Triage Vitals  Enc Vitals Group     BP 06/20/19 1112 (!) 196/105     Pulse Rate 06/20/19 1112 91     Resp 06/20/19 1112 20     Temp 06/20/19 1112 98.5 F (36.9 C)     Temp Source 06/20/19 1112 Oral     SpO2 06/20/19 1112 97 %     Weight --      Height --      Head Circumference --      Peak Flow --      Pain Score 06/20/19 1113 5     Pain Loc --      Pain Edu? --      Excl. in [place]? --    No data found.  Updated Vital Signs BP 139/72 (BP Location: Left Arm)   Pulse 91   Temp 98.5 F (36.9 C) (Oral)   Resp 20   SpO2 97%   Visual Acuity Right Eye Distance:   Left Eye Distance:   Bilateral Distance:    Right Eye Near: R Near: 20/70 Left Eye Near:  L Near: 20/70 Bilateral Near:  20/70(Patient had difficulty in following directions for visual acuity, these numbers are to the best of our efforts)  Physical Exam Constitutional:      General: He is not in acute distress. HENT:     Head: Normocephalic and  atraumatic.  Eyes:     General: Lids are normal. Lids are everted, no foreign bodies appreciated. Vision grossly intact. Gaze aligned appropriately. No allergic shiner, visual field deficit or scleral icterus.       Right eye: No foreign body, discharge or hordeolum.        Left eye: No foreign body, discharge or hordeolum.     Extraocular Movements: Extraocular movements intact.     Right eye: No nystagmus.     Left eye: No nystagmus.     Conjunctiva/sclera:     Right eye: No chemosis, exudate or hemorrhage.    Left eye: No chemosis, exudate or hemorrhage.    Pupils: Pupils are equal, round, and reactive to light.     Comments: Left conjunctive is minimally injected as compared to right.  Periorbital area is nontender, though patient does have left upper eyelid swelling without mass, fluctuance, warmth..  No discharge, crusting.  Cardiovascular:     Rate and Rhythm: Normal rate.  Pulmonary:     Effort: Pulmonary effort is normal. No respiratory distress.     Breath sounds: No wheezing.  Musculoskeletal:     Cervical back: No tenderness.  Lymphadenopathy:     Cervical: No cervical adenopathy.  Skin:    Coloration: Skin is not jaundiced or pale.  Neurological:     Mental Status: He is alert and oriented to person, place, and time.      UC Treatments / Results  Labs (all labs ordered are listed, but only abnormal results are displayed) Labs Reviewed - No data to display  EKG   Radiology No results found.  Procedures Procedures (including critical care time)  Medications Ordered in UC Medications - No data to display  Initial Impression / Assessment and Plan / UC Course  I have reviewed the triage vital signs and the nursing notes.  Pertinent labs & imaging results that were available during my care of the patient were reviewed by me and considered in my medical decision making (see chart for details).     Patient afebrile, nontoxic with reassuring exam.  Likely  blepharitis.  Will continue eyedrops as needed, stressed importance of hot compress regimen.  Provided number for ophthalmologist with instructions for patient to follow-up next week.  Return precautions discussed, patient verbalized understanding and is agreeable to plan. Final Clinical Impressions(s) / UC Diagnoses   Final diagnoses:  Irritation of left eye  Blepharitis of left upper eyelid, unspecified type     Discharge Instructions     Use hot compress x 10 min, 3-5 times a day for the next week. Follow up with eye doctor next week. Return if you develop worsening redness, pain, change in vision.    ED Prescriptions    None     PDMP not reviewed this encounter.   Hall-Potvin, [place], PA-C 06/20/19 1235    Hall-Potvin, [place], [place] 06/20/19 1235

## 2019-06-26 DIAGNOSIS — E039 Hypothyroidism, unspecified: Secondary | ICD-10-CM | POA: Diagnosis not present

## 2019-06-26 DIAGNOSIS — I10 Essential (primary) hypertension: Secondary | ICD-10-CM | POA: Diagnosis not present

## 2019-06-26 DIAGNOSIS — R300 Dysuria: Secondary | ICD-10-CM | POA: Diagnosis not present

## 2019-06-26 DIAGNOSIS — E119 Type 2 diabetes mellitus without complications: Secondary | ICD-10-CM | POA: Diagnosis not present

## 2019-06-26 DIAGNOSIS — E118 Type 2 diabetes mellitus with unspecified complications: Secondary | ICD-10-CM | POA: Diagnosis not present

## 2019-06-26 DIAGNOSIS — E782 Mixed hyperlipidemia: Secondary | ICD-10-CM | POA: Diagnosis not present

## 2019-06-26 DIAGNOSIS — R972 Elevated prostate specific antigen [PSA]: Secondary | ICD-10-CM | POA: Diagnosis not present

## 2019-07-25 DIAGNOSIS — R972 Elevated prostate specific antigen [PSA]: Secondary | ICD-10-CM | POA: Diagnosis not present

## 2019-07-31 DIAGNOSIS — L89219 Pressure ulcer of right hip, unspecified stage: Secondary | ICD-10-CM | POA: Diagnosis not present

## 2019-07-31 DIAGNOSIS — Z7409 Other reduced mobility: Secondary | ICD-10-CM | POA: Diagnosis not present

## 2019-08-01 DIAGNOSIS — N3943 Post-void dribbling: Secondary | ICD-10-CM | POA: Diagnosis not present

## 2019-08-01 DIAGNOSIS — R3121 Asymptomatic microscopic hematuria: Secondary | ICD-10-CM | POA: Diagnosis not present

## 2019-08-01 DIAGNOSIS — N401 Enlarged prostate with lower urinary tract symptoms: Secondary | ICD-10-CM | POA: Diagnosis not present

## 2019-08-01 DIAGNOSIS — R972 Elevated prostate specific antigen [PSA]: Secondary | ICD-10-CM | POA: Diagnosis not present

## 2019-08-08 DIAGNOSIS — E039 Hypothyroidism, unspecified: Secondary | ICD-10-CM | POA: Diagnosis not present

## 2019-08-08 DIAGNOSIS — Z8612 Personal history of poliomyelitis: Secondary | ICD-10-CM | POA: Diagnosis not present

## 2019-08-08 DIAGNOSIS — Z6828 Body mass index (BMI) 28.0-28.9, adult: Secondary | ICD-10-CM | POA: Diagnosis not present

## 2019-08-08 DIAGNOSIS — E663 Overweight: Secondary | ICD-10-CM | POA: Diagnosis not present

## 2019-08-08 DIAGNOSIS — E1142 Type 2 diabetes mellitus with diabetic polyneuropathy: Secondary | ICD-10-CM | POA: Diagnosis not present

## 2019-08-08 DIAGNOSIS — Z794 Long term (current) use of insulin: Secondary | ICD-10-CM | POA: Diagnosis not present

## 2019-08-08 DIAGNOSIS — N400 Enlarged prostate without lower urinary tract symptoms: Secondary | ICD-10-CM | POA: Diagnosis not present

## 2019-08-08 DIAGNOSIS — I10 Essential (primary) hypertension: Secondary | ICD-10-CM | POA: Diagnosis not present

## 2019-08-08 DIAGNOSIS — R600 Localized edema: Secondary | ICD-10-CM | POA: Diagnosis not present

## 2019-08-11 DIAGNOSIS — Z8612 Personal history of poliomyelitis: Secondary | ICD-10-CM | POA: Diagnosis not present

## 2019-08-11 DIAGNOSIS — E039 Hypothyroidism, unspecified: Secondary | ICD-10-CM | POA: Diagnosis not present

## 2019-08-11 DIAGNOSIS — I10 Essential (primary) hypertension: Secondary | ICD-10-CM | POA: Diagnosis not present

## 2019-08-11 DIAGNOSIS — E663 Overweight: Secondary | ICD-10-CM | POA: Diagnosis not present

## 2019-08-11 DIAGNOSIS — R600 Localized edema: Secondary | ICD-10-CM | POA: Diagnosis not present

## 2019-08-11 DIAGNOSIS — N400 Enlarged prostate without lower urinary tract symptoms: Secondary | ICD-10-CM | POA: Diagnosis not present

## 2019-08-11 DIAGNOSIS — Z6828 Body mass index (BMI) 28.0-28.9, adult: Secondary | ICD-10-CM | POA: Diagnosis not present

## 2019-08-11 DIAGNOSIS — E1142 Type 2 diabetes mellitus with diabetic polyneuropathy: Secondary | ICD-10-CM | POA: Diagnosis not present

## 2019-08-11 DIAGNOSIS — Z794 Long term (current) use of insulin: Secondary | ICD-10-CM | POA: Diagnosis not present

## 2019-08-12 DIAGNOSIS — R600 Localized edema: Secondary | ICD-10-CM | POA: Diagnosis not present

## 2019-08-12 DIAGNOSIS — Z8612 Personal history of poliomyelitis: Secondary | ICD-10-CM | POA: Diagnosis not present

## 2019-08-12 DIAGNOSIS — Z6828 Body mass index (BMI) 28.0-28.9, adult: Secondary | ICD-10-CM | POA: Diagnosis not present

## 2019-08-12 DIAGNOSIS — E1142 Type 2 diabetes mellitus with diabetic polyneuropathy: Secondary | ICD-10-CM | POA: Diagnosis not present

## 2019-08-12 DIAGNOSIS — Z794 Long term (current) use of insulin: Secondary | ICD-10-CM | POA: Diagnosis not present

## 2019-08-12 DIAGNOSIS — E039 Hypothyroidism, unspecified: Secondary | ICD-10-CM | POA: Diagnosis not present

## 2019-08-12 DIAGNOSIS — E663 Overweight: Secondary | ICD-10-CM | POA: Diagnosis not present

## 2019-08-12 DIAGNOSIS — N400 Enlarged prostate without lower urinary tract symptoms: Secondary | ICD-10-CM | POA: Diagnosis not present

## 2019-08-12 DIAGNOSIS — I10 Essential (primary) hypertension: Secondary | ICD-10-CM | POA: Diagnosis not present

## 2019-08-13 DIAGNOSIS — Z6828 Body mass index (BMI) 28.0-28.9, adult: Secondary | ICD-10-CM | POA: Diagnosis not present

## 2019-08-13 DIAGNOSIS — E039 Hypothyroidism, unspecified: Secondary | ICD-10-CM | POA: Diagnosis not present

## 2019-08-13 DIAGNOSIS — I10 Essential (primary) hypertension: Secondary | ICD-10-CM | POA: Diagnosis not present

## 2019-08-13 DIAGNOSIS — R600 Localized edema: Secondary | ICD-10-CM | POA: Diagnosis not present

## 2019-08-13 DIAGNOSIS — N400 Enlarged prostate without lower urinary tract symptoms: Secondary | ICD-10-CM | POA: Diagnosis not present

## 2019-08-13 DIAGNOSIS — E1142 Type 2 diabetes mellitus with diabetic polyneuropathy: Secondary | ICD-10-CM | POA: Diagnosis not present

## 2019-08-13 DIAGNOSIS — E663 Overweight: Secondary | ICD-10-CM | POA: Diagnosis not present

## 2019-08-13 DIAGNOSIS — Z794 Long term (current) use of insulin: Secondary | ICD-10-CM | POA: Diagnosis not present

## 2019-08-13 DIAGNOSIS — Z8612 Personal history of poliomyelitis: Secondary | ICD-10-CM | POA: Diagnosis not present

## 2019-08-15 DIAGNOSIS — E1142 Type 2 diabetes mellitus with diabetic polyneuropathy: Secondary | ICD-10-CM | POA: Diagnosis not present

## 2019-08-15 DIAGNOSIS — M353 Polymyalgia rheumatica: Secondary | ICD-10-CM | POA: Diagnosis not present

## 2019-08-15 DIAGNOSIS — N400 Enlarged prostate without lower urinary tract symptoms: Secondary | ICD-10-CM | POA: Diagnosis not present

## 2019-08-15 DIAGNOSIS — E1121 Type 2 diabetes mellitus with diabetic nephropathy: Secondary | ICD-10-CM | POA: Diagnosis not present

## 2019-08-15 DIAGNOSIS — E1169 Type 2 diabetes mellitus with other specified complication: Secondary | ICD-10-CM | POA: Diagnosis not present

## 2019-08-15 DIAGNOSIS — I10 Essential (primary) hypertension: Secondary | ICD-10-CM | POA: Diagnosis not present

## 2019-08-15 DIAGNOSIS — E039 Hypothyroidism, unspecified: Secondary | ICD-10-CM | POA: Diagnosis not present

## 2019-08-15 DIAGNOSIS — I739 Peripheral vascular disease, unspecified: Secondary | ICD-10-CM | POA: Diagnosis not present

## 2019-08-15 DIAGNOSIS — E11319 Type 2 diabetes mellitus with unspecified diabetic retinopathy without macular edema: Secondary | ICD-10-CM | POA: Diagnosis not present

## 2019-08-15 DIAGNOSIS — R600 Localized edema: Secondary | ICD-10-CM | POA: Diagnosis not present

## 2019-08-15 DIAGNOSIS — Z0000 Encounter for general adult medical examination without abnormal findings: Secondary | ICD-10-CM | POA: Diagnosis not present

## 2019-08-19 DIAGNOSIS — E663 Overweight: Secondary | ICD-10-CM | POA: Diagnosis not present

## 2019-08-19 DIAGNOSIS — N400 Enlarged prostate without lower urinary tract symptoms: Secondary | ICD-10-CM | POA: Diagnosis not present

## 2019-08-19 DIAGNOSIS — Z8612 Personal history of poliomyelitis: Secondary | ICD-10-CM | POA: Diagnosis not present

## 2019-08-19 DIAGNOSIS — R600 Localized edema: Secondary | ICD-10-CM | POA: Diagnosis not present

## 2019-08-19 DIAGNOSIS — I10 Essential (primary) hypertension: Secondary | ICD-10-CM | POA: Diagnosis not present

## 2019-08-19 DIAGNOSIS — E039 Hypothyroidism, unspecified: Secondary | ICD-10-CM | POA: Diagnosis not present

## 2019-08-19 DIAGNOSIS — Z6828 Body mass index (BMI) 28.0-28.9, adult: Secondary | ICD-10-CM | POA: Diagnosis not present

## 2019-08-19 DIAGNOSIS — E1142 Type 2 diabetes mellitus with diabetic polyneuropathy: Secondary | ICD-10-CM | POA: Diagnosis not present

## 2019-08-19 DIAGNOSIS — Z794 Long term (current) use of insulin: Secondary | ICD-10-CM | POA: Diagnosis not present

## 2019-08-20 ENCOUNTER — Other Ambulatory Visit (HOSPITAL_COMMUNITY): Payer: Self-pay | Admitting: Internal Medicine

## 2019-08-20 DIAGNOSIS — I739 Peripheral vascular disease, unspecified: Secondary | ICD-10-CM

## 2019-08-21 ENCOUNTER — Ambulatory Visit (HOSPITAL_COMMUNITY)
Admission: RE | Admit: 2019-08-21 | Discharge: 2019-08-21 | Disposition: A | Discharge status: Home / Self Care | Payer: Medicare HMO | Source: Ambulatory Visit | Attending: Internal Medicine | Admitting: Internal Medicine

## 2019-08-21 ENCOUNTER — Other Ambulatory Visit: Payer: Self-pay

## 2019-08-21 DIAGNOSIS — Z6828 Body mass index (BMI) 28.0-28.9, adult: Secondary | ICD-10-CM | POA: Diagnosis not present

## 2019-08-21 DIAGNOSIS — R600 Localized edema: Secondary | ICD-10-CM | POA: Diagnosis not present

## 2019-08-21 DIAGNOSIS — I10 Essential (primary) hypertension: Secondary | ICD-10-CM | POA: Diagnosis not present

## 2019-08-21 DIAGNOSIS — Z8612 Personal history of poliomyelitis: Secondary | ICD-10-CM | POA: Diagnosis not present

## 2019-08-21 DIAGNOSIS — E039 Hypothyroidism, unspecified: Secondary | ICD-10-CM | POA: Diagnosis not present

## 2019-08-21 DIAGNOSIS — I739 Peripheral vascular disease, unspecified: Secondary | ICD-10-CM | POA: Diagnosis not present

## 2019-08-21 DIAGNOSIS — E1142 Type 2 diabetes mellitus with diabetic polyneuropathy: Secondary | ICD-10-CM | POA: Diagnosis not present

## 2019-08-21 DIAGNOSIS — N400 Enlarged prostate without lower urinary tract symptoms: Secondary | ICD-10-CM | POA: Diagnosis not present

## 2019-08-21 DIAGNOSIS — E663 Overweight: Secondary | ICD-10-CM | POA: Diagnosis not present

## 2019-08-21 DIAGNOSIS — Z794 Long term (current) use of insulin: Secondary | ICD-10-CM | POA: Diagnosis not present

## 2019-08-21 NOTE — Progress Notes (Signed)
VASCULAR LAB PRELIMINARY  PRELIMINARY  PRELIMINARY  PRELIMINARY  ABIs completed.    Preliminary report:  See CV proc for preliminary results.   Ariyonna Twichell, RVT 08/21/2019, 2:51 PM

## 2019-08-22 DIAGNOSIS — Z8612 Personal history of poliomyelitis: Secondary | ICD-10-CM | POA: Diagnosis not present

## 2019-08-22 DIAGNOSIS — E1142 Type 2 diabetes mellitus with diabetic polyneuropathy: Secondary | ICD-10-CM | POA: Diagnosis not present

## 2019-08-22 DIAGNOSIS — N400 Enlarged prostate without lower urinary tract symptoms: Secondary | ICD-10-CM | POA: Diagnosis not present

## 2019-08-22 DIAGNOSIS — R600 Localized edema: Secondary | ICD-10-CM | POA: Diagnosis not present

## 2019-08-22 DIAGNOSIS — E663 Overweight: Secondary | ICD-10-CM | POA: Diagnosis not present

## 2019-08-22 DIAGNOSIS — E039 Hypothyroidism, unspecified: Secondary | ICD-10-CM | POA: Diagnosis not present

## 2019-08-22 DIAGNOSIS — Z6828 Body mass index (BMI) 28.0-28.9, adult: Secondary | ICD-10-CM | POA: Diagnosis not present

## 2019-08-22 DIAGNOSIS — I10 Essential (primary) hypertension: Secondary | ICD-10-CM | POA: Diagnosis not present

## 2019-08-22 DIAGNOSIS — Z794 Long term (current) use of insulin: Secondary | ICD-10-CM | POA: Diagnosis not present

## 2019-08-24 ENCOUNTER — Emergency Department (HOSPITAL_COMMUNITY)
Admission: EM | Admit: 2019-08-24 | Discharge: 2019-08-24 | Disposition: A | Discharge status: Home / Self Care | Payer: Medicare HMO | Attending: Emergency Medicine | Admitting: Emergency Medicine

## 2019-08-24 ENCOUNTER — Other Ambulatory Visit: Payer: Self-pay

## 2019-08-24 ENCOUNTER — Emergency Department (HOSPITAL_COMMUNITY): Payer: Medicare HMO

## 2019-08-24 ENCOUNTER — Encounter (HOSPITAL_COMMUNITY): Payer: Self-pay | Admitting: Pharmacy Technician

## 2019-08-24 DIAGNOSIS — Z79899 Other long term (current) drug therapy: Secondary | ICD-10-CM | POA: Insufficient documentation

## 2019-08-24 DIAGNOSIS — R9341 Abnormal radiologic findings on diagnostic imaging of renal pelvis, ureter, or bladder: Secondary | ICD-10-CM | POA: Diagnosis not present

## 2019-08-24 DIAGNOSIS — N3289 Other specified disorders of bladder: Secondary | ICD-10-CM

## 2019-08-24 DIAGNOSIS — Z87442 Personal history of urinary calculi: Secondary | ICD-10-CM | POA: Insufficient documentation

## 2019-08-24 DIAGNOSIS — Z794 Long term (current) use of insulin: Secondary | ICD-10-CM | POA: Insufficient documentation

## 2019-08-24 DIAGNOSIS — R3911 Hesitancy of micturition: Secondary | ICD-10-CM | POA: Diagnosis not present

## 2019-08-24 DIAGNOSIS — R112 Nausea with vomiting, unspecified: Secondary | ICD-10-CM | POA: Insufficient documentation

## 2019-08-24 DIAGNOSIS — E119 Type 2 diabetes mellitus without complications: Secondary | ICD-10-CM | POA: Diagnosis not present

## 2019-08-24 DIAGNOSIS — I10 Essential (primary) hypertension: Secondary | ICD-10-CM | POA: Diagnosis not present

## 2019-08-24 DIAGNOSIS — R109 Unspecified abdominal pain: Secondary | ICD-10-CM

## 2019-08-24 DIAGNOSIS — K5730 Diverticulosis of large intestine without perforation or abscess without bleeding: Secondary | ICD-10-CM | POA: Diagnosis not present

## 2019-08-24 DIAGNOSIS — R1084 Generalized abdominal pain: Secondary | ICD-10-CM | POA: Insufficient documentation

## 2019-08-24 LAB — URINALYSIS, ROUTINE W REFLEX MICROSCOPIC
Bacteria, UA: NONE SEEN
Bilirubin Urine: NEGATIVE
Glucose, UA: 150 mg/dL — AB
Ketones, ur: 5 mg/dL — AB
Leukocytes,Ua: NEGATIVE
Nitrite: NEGATIVE
Protein, ur: 100 mg/dL — AB
Specific Gravity, Urine: 1.029 (ref 1.005–1.030)
pH: 7 (ref 5.0–8.0)

## 2019-08-24 LAB — COMPREHENSIVE METABOLIC PANEL
ALT: 24 U/L (ref 0–44)
AST: 23 U/L (ref 15–41)
Albumin: 3.8 g/dL (ref 3.5–5.0)
Alkaline Phosphatase: 118 U/L (ref 38–126)
Anion gap: 8 (ref 5–15)
BUN: 20 mg/dL (ref 8–23)
CO2: 26 mmol/L (ref 22–32)
Calcium: 9.3 mg/dL (ref 8.9–10.3)
Chloride: 102 mmol/L (ref 98–111)
Creatinine, Ser: 1.49 mg/dL — ABNORMAL HIGH (ref 0.61–1.24)
GFR calc Af Amer: 50 mL/min — ABNORMAL LOW (ref >60)
GFR calc non Af Amer: 43 mL/min — ABNORMAL LOW (ref >60)
Glucose, Bld: 183 mg/dL — ABNORMAL HIGH (ref 70–99)
Potassium: 4.2 mmol/L (ref 3.5–5.1)
Sodium: 136 mmol/L (ref 135–145)
Total Bilirubin: 1.1 mg/dL (ref 0.3–1.2)
Total Protein: 7.7 g/dL (ref 6.5–8.1)

## 2019-08-24 LAB — CBC
HCT: 50.8 % (ref 39.0–52.0)
Hemoglobin: 16.6 g/dL (ref 13.0–17.0)
MCH: 32.2 pg (ref 26.0–34.0)
MCHC: 32.7 g/dL (ref 30.0–36.0)
MCV: 98.4 fL (ref 80.0–100.0)
Platelets: 203 10*3/uL (ref 150–400)
RBC: 5.16 MIL/uL (ref 4.22–5.81)
RDW: 12.8 % (ref 11.5–15.5)
WBC: 7 10*3/uL (ref 4.0–10.5)
nRBC: 0 % (ref 0.0–0.2)

## 2019-08-24 LAB — LIPASE, BLOOD: Lipase: 37 U/L (ref 11–51)

## 2019-08-24 LAB — TROPONIN I (HIGH SENSITIVITY): Troponin I (High Sensitivity): 8 ng/L (ref <18)

## 2019-08-24 LAB — CBG MONITORING, ED: Glucose-Capillary: 172 mg/dL — ABNORMAL HIGH (ref 70–99)

## 2019-08-24 MED ORDER — HYDRALAZINE HCL 20 MG/ML IJ SOLN
10.0000 mg | Freq: Once | INTRAMUSCULAR | Status: DC
Start: 2019-08-24 — End: 2019-08-24

## 2019-08-24 MED ORDER — SODIUM CHLORIDE 0.9 % IV BOLUS
1000.0000 mL | Freq: Once | INTRAVENOUS | Status: AC
  Administered 2019-08-24: 1000 mL via INTRAVENOUS

## 2019-08-24 MED ORDER — SODIUM CHLORIDE 0.9% FLUSH
3.0000 mL | Freq: Once | INTRAVENOUS | Status: AC
  Administered 2019-08-24: 3 mL via INTRAVENOUS

## 2019-08-24 MED ORDER — IOHEXOL 300 MG/ML  SOLN
100.0000 mL | Freq: Once | INTRAMUSCULAR | Status: AC | PRN
  Administered 2019-08-24: 100 mL via INTRAVENOUS

## 2019-08-24 MED ORDER — ONDANSETRON HCL 4 MG/2ML IJ SOLN
4.0000 mg | Freq: Once | INTRAMUSCULAR | Status: AC
  Administered 2019-08-24: 4 mg via INTRAVENOUS
  Filled 2019-08-24: qty 2

## 2019-08-24 MED ORDER — HYDRALAZINE HCL 20 MG/ML IJ SOLN
10.0000 mg | Freq: Once | INTRAMUSCULAR | Status: AC
  Administered 2019-08-24: 10 mg via INTRAVENOUS
  Filled 2019-08-24: qty 1

## 2019-08-24 MED ORDER — ONDANSETRON 4 MG PO TBDP: ORAL_TABLET | ORAL | 0 refills | Status: DC

## 2019-08-24 NOTE — ED Notes (Signed)
Bladder scanner reads 21 ml, pt informs this nurse he voided this afternoon.

## 2019-08-24 NOTE — ED Provider Notes (Signed)
[facility] EMERGENCY DEPARTMENT Provider Note   CSN: [id number] Arrival date & time: 08/24/19  1643     History Chief Complaint  Patient presents with  . Abdominal Pain    Mike Mack is a 84 y.o. male hx of BPH, HTN, previous small bowel obstruction, diabetes here presenting with abdominal pain, diarrhea, nausea.  Patient vomited twice today.  He has some loose stool as well.  States that he has some trouble urinating as well. Patient denies any dysuria.  Patient felt that his abdomen is more distended.  He has a history of SBO in the past.   The history is provided by the patient.       Past Medical History:  Diagnosis Date  . Anemia 05/13/2013  . Arthritis    "right leg" (07/25/2013)  . BPH (benign prostatic hyperplasia)   . Constipation 05/13/2013  . Exertional shortness of breath    "sometimes" (07/25/2013)  . History of kidney stones   . History of stomach ulcers   . Hypertension   . Neuropathy   . Polio   . Polio Childhood  . Small bowel obstruction ([place])   . Stroke [facility]) 2014   residual:  "left hand kind of numb" (07/25/2013)  . Type II diabetes mellitus [facility])     Patient Active Problem List   Diagnosis Date Noted  . Syncope 10/25/2014  . AKI (acute kidney injury) ([place]) 10/25/2014  . H/O: CVA (cerebrovascular accident) 10/25/2014  . H/O poliomyelitis 10/25/2014  . DM II (diabetes mellitus, type II), controlled ([place]) 10/25/2014  . Tachycardia 08/29/2013  . Cough 08/29/2013  . Monoparesis of leg ([place]) 08/20/2013  . Muscle weakness of lower extremity 08/20/2013  . Right upper quadrant abdominal abscess ([place]) 08/15/2013  . Hepatic abscess 08/15/2013  . Severe sepsis ([place]) 08/15/2013  . Subacute delirium 08/14/2013  . Hyperglycemia 08/14/2013  . Oral thrush 08/14/2013  . Leukocytosis, unspecified 08/14/2013  . Debility 08/14/2013  . S/P cholecystectomy 07/29/2013  . Cholecystitis with cholelithiasis 07/25/2013  . Arthritis 06/06/2013    . Headache(784.0) 06/06/2013  . Headache 06/06/2013  . Generalized weakness 05/24/2013  . Cholecystitis, acute 05/24/2013  . RUQ abdominal pain 05/13/2013  . Acute cholecystitis: Probable 05/13/2013  . Constipation 05/13/2013  . Enterococcus UTI 05/13/2013  . Leukocytosis 05/13/2013  . Anemia 05/13/2013  . Stroke ([place]) 04/15/2012  . Acute left thalamic CVA (cerebral infarction) 04/10/2012  . Diabetes mellitus without complication ([place])   . Hypertension   . BPH (benign prostatic hyperplasia)   . Pruritic disorder 02/02/2011    Past Surgical History:  Procedure Laterality Date  . BOWEL RESECTION    . CATARACT EXTRACTION W/ INTRAOCULAR LENS  IMPLANT, BILATERAL Bilateral 2000's  . CHOLECYSTECTOMY N/A 07/25/2013   Procedure: LAPAROSCOPIC CHOLECYSTECTOMY WITH INTRAOPERATIVE CHOLANGIOGRAM;  Surgeon: [name], MD;  Location: [facility];  Service: General;  Laterality: N/A;  . COLON SURGERY    . COLONOSCOPY     Hx: of  . EXCISIONAL HEMORRHOIDECTOMY  2000's  . EXTRACORPOREAL SHOCK WAVE LITHOTRIPSY Right 10/03/2018   Procedure: EXTRACORPOREAL SHOCK WAVE LITHOTRIPSY (ESWL);  Surgeon: Festus Aloe, MD;  Location: [facility];  Service: Urology;  Laterality: Right;  . INGUINAL HERNIA REPAIR Right 1970's  . LAPAROSCOPIC CHOLECYSTECTOMY  07/25/2013   w/LOA (07/25/2013)       Family History  Problem Relation Age of Onset  . Heart failure Mother   . Diabetes Sister   . Diabetes Sister   . Diabetes Brother   .  Hypertension Sister   . Hypertension Sister   . Hypertension Brother   . Heart attack Sister     Social History   Tobacco Use  . Smoking status: Never Smoker  . Smokeless tobacco: Never Used  Substance Use Topics  . Alcohol use: No  . Drug use: No    Home Medications Prior to Admission medications   Medication Sig Start Date End Date Taking? Authorizing Provider  aspirin EC 81 MG tablet Take 81 mg by mouth daily.    [provider]  atorvastatin (LIPITOR) 20  MG tablet Take 1 tablet by mouth every evening. 11/03/13   [provider]  candesartan (ATACAND) 16 MG tablet Take 16 mg by mouth daily.  09/26/18   [provider]  cholecalciferol (VITAMIN D) 25 MCG (1000 UT) tablet Take 1,000 Units by mouth daily.    [provider]  clotrimazole-betamethasone (LOTRISONE) lotion Apply 1 application topically 2 (two) times daily.    [provider]  COMBIGAN 0.2-0.5 % ophthalmic solution Place 1 drop into both eyes 2 (two) times daily. 09/21/14   [provider]  gabapentin (NEURONTIN) 800 MG tablet Take 800 mg by mouth 2 (two) times daily.  10/07/14   [provider]  glucose blood test strip  04/04/15   [provider]  insulin aspart (NOVOLOG) 100 UNIT/ML injection Inject 6 Units into the skin daily.    [provider]  insulin detemir (LEVEMIR) 100 UNIT/ML injection Inject 18 Units into the skin 2 (two) times daily.     [provider]  latanoprost (XALATAN) 0.005 % ophthalmic solution Place 1 drop into both eyes at bedtime.  11/17/14   [provider]  Levothyroxine Sodium 88 MCG CAPS Take 88 mcg by mouth daily.  04/21/11   [provider]  metoprolol tartrate (LOPRESSOR) 25 MG tablet Take 25 mg by mouth 2 (two) times daily.    [provider]  Multiple Vitamin (MULTIVITAMIN WITH MINERALS) TABS tablet Take 1 tablet by mouth daily. Patient not taking: Reported on 03/23/2019 09/06/13   [name], MD  pimecrolimus (ELIDEL) 1 % cream Apply 1 application topically 2 (two) times daily. Apply to arms and legs    [provider]  pioglitazone (ACTOS) 15 MG tablet Take 15 mg by mouth 2 (two) times daily.     [provider]  polyethylene glycol (MIRALAX / GLYCOLAX) packet Take 17 g by mouth daily. Patient taking differently: Take 17 g by mouth daily as needed for mild constipation.  09/06/13   [name], MD  Tamsulosin HCl (FLOMAX)  0.4 MG CAPS Take 0.4 mg by mouth 2 (two) times daily.     [provider]  Travoprost, BAK Free, (TRAVATAN) 0.004 % SOLN ophthalmic solution Place 1 drop into both eyes daily.     [provider]  vitamin B-12 (CYANOCOBALAMIN) 1000 MCG tablet Take 1,000 mcg by mouth daily.    [provider]    Allergies    Metformin and related and Soliqua [insulin glargine-lixisenatide]  Review of Systems   Review of Systems  Gastrointestinal: Positive for abdominal pain.  All other systems reviewed and are negative.   Physical Exam Updated Vital Signs BP 122/72   Pulse 64   Temp 98.3 F (36.8 C) (Oral)   Resp 20   Wt 86.2 kg   SpO2 97%   BMI 31.62 kg/m   Physical Exam Vitals and nursing note reviewed.  HENT:  Head: Normocephalic.     Mouth/Throat:     Mouth: Mucous membranes are moist.  Eyes:     Extraocular Movements: Extraocular movements intact.  Cardiovascular:     Rate and Rhythm: Normal rate and regular rhythm.     Heart sounds: Normal heart sounds.  Pulmonary:     Effort: Pulmonary effort is normal.     Breath sounds: Normal breath sounds.  Abdominal:     Comments: Distended, mild diffuse tenderness   Skin:    General: Skin is warm.     Capillary Refill: Capillary refill takes less than 2 seconds.  Neurological:     General: No focal deficit present.     Mental Status: He is alert and oriented to person, place, and time.  Psychiatric:        Mood and Affect: Mood normal.        Behavior: Behavior normal.     ED Results / Procedures / Treatments   Labs (all labs ordered are listed, but only abnormal results are displayed) Labs Reviewed  COMPREHENSIVE METABOLIC PANEL - Abnormal; Notable for the following components:      Result Value   Glucose, Bld 183 (*)    Creatinine, Ser 1.49 (*)    GFR calc non Af Amer 43 (*)    GFR calc Af Amer 50 (*)    All other components within normal limits  URINALYSIS, ROUTINE W REFLEX MICROSCOPIC -  Abnormal; Notable for the following components:   Color, Urine STRAW (*)    Glucose, UA 150 (*)    Hgb urine dipstick SMALL (*)    Ketones, ur 5 (*)    Protein, ur 100 (*)    All other components within normal limits  CBG MONITORING, ED - Abnormal; Notable for the following components:   Glucose-Capillary 172 (*)    All other components within normal limits  URINE CULTURE  LIPASE, BLOOD  CBC  TROPONIN I (HIGH SENSITIVITY)    EKG EKG Interpretation  Date/Time:  Sunday August 24 2019 18:42:49 EST Ventricular Rate:  69 PR Interval:    QRS Duration: 95 QT Interval:  428 QTC Calculation: 459 R Axis:   -7 Text Interpretation: Sinus rhythm Atrial premature complex Probable left atrial enlargement No significant change since last tracing Confirmed by Wandra Arthurs [phone]) on 08/24/2019 7:21:10 PM   Radiology CT ABDOMEN PELVIS W CONTRAST  Result Date: 08/24/2019 CLINICAL DATA:  Few abdominal distension with emesis and diarrhea EXAM: CT ABDOMEN AND PELVIS WITH CONTRAST TECHNIQUE: Multidetector CT imaging of the abdomen and pelvis was performed using the standard protocol following bolus administration of intravenous contrast. CONTRAST:  158mL OMNIPAQUE IOHEXOL 300 MG/ML  SOLN COMPARISON:  CT abdomen pelvis 02/23/2019 FINDINGS: Lower chest: Trace pleural thickening bilaterally may reflect small pericardial effusion with adjacent atelectatic changes. Lung bases are otherwise clear. Mild cardiomegaly. Coronary artery calcifications are noted. Small pericaval lipoma, benign incidental finding. Hepatobiliary: No focal liver abnormality is seen. Patient is post cholecystectomy. Slight prominence of the biliary tree likely related to reservoir effect. No calcified intraductal gallstones. Pancreas: Unremarkable. No pancreatic ductal dilatation or surrounding inflammatory changes. Spleen: Normal in size. Few subcentimeter hypodense foci are similar to prior, too small to fully characterize on CT imaging but  statistically likely benign. Adrenals/Urinary Tract: Normal adrenal glands. Multiple fluid attenuation cysts seen in both kidneys, largest in the lower pole left kidney measures 3.6 cm in size. No concerning renal lesion. No urolithiasis or hydronephrosis. Indentation of the bladder base  by the enlarged prostate. Some asymmetric bladder wall thickening is noted along the left lateral aspect of the in a bladder. Stomach/Bowel: Small fluid-filled hiatal hernia. Distal stomach and duodenum are unremarkable. No small bowel dilatation or wall thickening. A normal appendix is visualized. No colonic dilatation or wall thickening. Tortuosity of the sigmoid which extends across the midline abdomen to the right lower quadrant prior to returning midline to the rectum. Scattered colonic diverticula without focal pericolonic inflammation to suggest diverticulitis. Vascular/Lymphatic: Mild iliac artery atherosclerosis. Stable ovoid calcification in the deep pelvis, likely calcified lymph node or peritoneal loose body. No suspicious or enlarged lymph nodes in the included lymphatic chains. Reproductive: Slightly heterogeneous, enlarged prostate. No acute abnormality of the seminal vesicles. Other: Small fat containing umbilical hernia superimposed on a ventral diastasis. Skin thickening in the right lower quadrant likely related to injectable use. Musculoskeletal: Right gluteal and proximal femoral abductor fatty atrophy. Levocurvature of the lumbar spine with an apex at L3 is similar to priors. Multilevel degenerative changes are present in the imaged portions of the spine. No acute osseous abnormality or suspicious osseous lesion. IMPRESSION: 1. No acute findings in the abdomen or pelvis to explain the patient's symptoms. 2. Asymmetric bladder wall thickening along the left lateral aspect of the urinary bladder. Possibly related to bladder outlet obstruction given indentation by the bladder base however neoplasia cannot be  excluded. Consider outpatient urologic consultation and urine cytology. 3. Colonic diverticulosis 4. Small fluid-filled hiatal hernia. 5. Coronary calcifications and cardiomegaly. 6. Trace bilateral pleural effusions. 7. Fatty atrophy of the right gluteal musculature and hip abductors. These results were called by telephone at the time of interpretation on 08/24/2019 at 7:20 pm to provider Zarina Pe , who verbally acknowledged these results. Electronically Signed   By: Lovena Le M.D.   On: 08/24/2019 19:21    Procedures Procedures (including critical care time)  Medications Ordered in ED Medications  sodium chloride flush (NS) 0.9 % injection 3 mL (3 mLs Intravenous Given 08/24/19 1927)  sodium chloride 0.9 % bolus 1,000 mL (1,000 mLs Intravenous New Bag/Given 08/24/19 1832)  ondansetron (ZOFRAN) injection 4 mg (4 mg Intravenous Given 08/24/19 1834)  hydrALAZINE (APRESOLINE) injection 10 mg (10 mg Intravenous Given 08/24/19 1832)  iohexol (OMNIPAQUE) 300 MG/ML solution 100 mL (100 mLs Intravenous Contrast Given 08/24/19 1853)    ED Course  I have reviewed the triage vital signs and the nursing notes.  Pertinent labs & imaging results that were available during my care of the patient were reviewed by me and considered in my medical decision making (see chart for details).    MDM Rules/Calculators/A&P                      Mike Mack is a 84 y.o. male here with abdominal pain, vomiting, diarrhea. Consider viral gastro vs SBO. Will get labs, UA, CT ab/pel. Will hydrate and reassess.   8:55 PM Labs unremarkable. UA showed blood but no UTI. CT showed bladder wall thickening with possible mass causing bladder outlet obstruction.  Patient urinated several times and his post void is 0 cc.  He has no obstruction on the CT abdomen pelvis.  Stable for discharge.  He already follows up with Dr. Gloriann Loan from urology.  We will have him follow-up outpatient for possible cystoscopy.  Final Clinical Impression(s)  / ED Diagnoses Final diagnoses:  None    Rx / DC Orders ED Discharge Orders    None  Drenda Freeze, MD 08/24/19 [phone]

## 2019-08-24 NOTE — ED Notes (Signed)
Tech assisted pt with urinal. Pt voided aprox. 100 mL. Post void bladder scan shows 0 mL.

## 2019-08-24 NOTE — ED Notes (Signed)
Pt transported to CT ?

## 2019-08-24 NOTE — Discharge Instructions (Addendum)
You may have a mass in your bladder causing you to have trouble urinating   Take zofran for nausea   Stay hydrated   Follow up with urologist   Return to ER if you have trouble urinating, blood clots in urine, vomiting, fever, unable to urinate

## 2019-08-24 NOTE — ED Triage Notes (Signed)
Pt arrives pov with epigastric pain onset today. Endorses emesis X2 along with diarrhea. Denies fevers. No known sick contacts.

## 2019-08-24 NOTE — ED Notes (Signed)
Discharge instructions reviewed with pt. Pt verbalized understanding.   

## 2019-08-26 DIAGNOSIS — Z8612 Personal history of poliomyelitis: Secondary | ICD-10-CM | POA: Diagnosis not present

## 2019-08-26 DIAGNOSIS — E1142 Type 2 diabetes mellitus with diabetic polyneuropathy: Secondary | ICD-10-CM | POA: Diagnosis not present

## 2019-08-26 DIAGNOSIS — N400 Enlarged prostate without lower urinary tract symptoms: Secondary | ICD-10-CM | POA: Diagnosis not present

## 2019-08-26 DIAGNOSIS — E039 Hypothyroidism, unspecified: Secondary | ICD-10-CM | POA: Diagnosis not present

## 2019-08-26 DIAGNOSIS — I10 Essential (primary) hypertension: Secondary | ICD-10-CM | POA: Diagnosis not present

## 2019-08-26 DIAGNOSIS — Z6828 Body mass index (BMI) 28.0-28.9, adult: Secondary | ICD-10-CM | POA: Diagnosis not present

## 2019-08-26 DIAGNOSIS — Z794 Long term (current) use of insulin: Secondary | ICD-10-CM | POA: Diagnosis not present

## 2019-08-26 DIAGNOSIS — R600 Localized edema: Secondary | ICD-10-CM | POA: Diagnosis not present

## 2019-08-26 DIAGNOSIS — E663 Overweight: Secondary | ICD-10-CM | POA: Diagnosis not present

## 2019-08-26 LAB — URINE CULTURE: Culture: 10000 — AB

## 2019-08-28 DIAGNOSIS — E1142 Type 2 diabetes mellitus with diabetic polyneuropathy: Secondary | ICD-10-CM | POA: Diagnosis not present

## 2019-08-28 DIAGNOSIS — E039 Hypothyroidism, unspecified: Secondary | ICD-10-CM | POA: Diagnosis not present

## 2019-08-28 DIAGNOSIS — R600 Localized edema: Secondary | ICD-10-CM | POA: Diagnosis not present

## 2019-08-28 DIAGNOSIS — Z8612 Personal history of poliomyelitis: Secondary | ICD-10-CM | POA: Diagnosis not present

## 2019-08-28 DIAGNOSIS — N400 Enlarged prostate without lower urinary tract symptoms: Secondary | ICD-10-CM | POA: Diagnosis not present

## 2019-08-28 DIAGNOSIS — E663 Overweight: Secondary | ICD-10-CM | POA: Diagnosis not present

## 2019-08-28 DIAGNOSIS — Z794 Long term (current) use of insulin: Secondary | ICD-10-CM | POA: Diagnosis not present

## 2019-08-28 DIAGNOSIS — Z6828 Body mass index (BMI) 28.0-28.9, adult: Secondary | ICD-10-CM | POA: Diagnosis not present

## 2019-08-28 DIAGNOSIS — I10 Essential (primary) hypertension: Secondary | ICD-10-CM | POA: Diagnosis not present

## 2019-08-29 DIAGNOSIS — H462 Nutritional optic neuropathy: Secondary | ICD-10-CM | POA: Diagnosis not present

## 2019-08-29 DIAGNOSIS — H401133 Primary open-angle glaucoma, bilateral, severe stage: Secondary | ICD-10-CM | POA: Diagnosis not present

## 2019-09-02 DIAGNOSIS — Z6828 Body mass index (BMI) 28.0-28.9, adult: Secondary | ICD-10-CM | POA: Diagnosis not present

## 2019-09-02 DIAGNOSIS — E1142 Type 2 diabetes mellitus with diabetic polyneuropathy: Secondary | ICD-10-CM | POA: Diagnosis not present

## 2019-09-02 DIAGNOSIS — Z8612 Personal history of poliomyelitis: Secondary | ICD-10-CM | POA: Diagnosis not present

## 2019-09-02 DIAGNOSIS — R600 Localized edema: Secondary | ICD-10-CM | POA: Diagnosis not present

## 2019-09-02 DIAGNOSIS — I10 Essential (primary) hypertension: Secondary | ICD-10-CM | POA: Diagnosis not present

## 2019-09-02 DIAGNOSIS — E039 Hypothyroidism, unspecified: Secondary | ICD-10-CM | POA: Diagnosis not present

## 2019-09-02 DIAGNOSIS — Z794 Long term (current) use of insulin: Secondary | ICD-10-CM | POA: Diagnosis not present

## 2019-09-02 DIAGNOSIS — N400 Enlarged prostate without lower urinary tract symptoms: Secondary | ICD-10-CM | POA: Diagnosis not present

## 2019-09-02 DIAGNOSIS — E663 Overweight: Secondary | ICD-10-CM | POA: Diagnosis not present

## 2019-09-03 DIAGNOSIS — I10 Essential (primary) hypertension: Secondary | ICD-10-CM | POA: Diagnosis not present

## 2019-09-03 DIAGNOSIS — Z8612 Personal history of poliomyelitis: Secondary | ICD-10-CM | POA: Diagnosis not present

## 2019-09-03 DIAGNOSIS — Z794 Long term (current) use of insulin: Secondary | ICD-10-CM | POA: Diagnosis not present

## 2019-09-03 DIAGNOSIS — E039 Hypothyroidism, unspecified: Secondary | ICD-10-CM | POA: Diagnosis not present

## 2019-09-03 DIAGNOSIS — E1142 Type 2 diabetes mellitus with diabetic polyneuropathy: Secondary | ICD-10-CM | POA: Diagnosis not present

## 2019-09-03 DIAGNOSIS — R600 Localized edema: Secondary | ICD-10-CM | POA: Diagnosis not present

## 2019-09-03 DIAGNOSIS — Z6828 Body mass index (BMI) 28.0-28.9, adult: Secondary | ICD-10-CM | POA: Diagnosis not present

## 2019-09-03 DIAGNOSIS — N400 Enlarged prostate without lower urinary tract symptoms: Secondary | ICD-10-CM | POA: Diagnosis not present

## 2019-09-03 DIAGNOSIS — E663 Overweight: Secondary | ICD-10-CM | POA: Diagnosis not present

## 2019-09-07 DIAGNOSIS — N400 Enlarged prostate without lower urinary tract symptoms: Secondary | ICD-10-CM | POA: Diagnosis not present

## 2019-09-07 DIAGNOSIS — Z6828 Body mass index (BMI) 28.0-28.9, adult: Secondary | ICD-10-CM | POA: Diagnosis not present

## 2019-09-07 DIAGNOSIS — R600 Localized edema: Secondary | ICD-10-CM | POA: Diagnosis not present

## 2019-09-07 DIAGNOSIS — Z794 Long term (current) use of insulin: Secondary | ICD-10-CM | POA: Diagnosis not present

## 2019-09-07 DIAGNOSIS — E1142 Type 2 diabetes mellitus with diabetic polyneuropathy: Secondary | ICD-10-CM | POA: Diagnosis not present

## 2019-09-07 DIAGNOSIS — E663 Overweight: Secondary | ICD-10-CM | POA: Diagnosis not present

## 2019-09-07 DIAGNOSIS — Z8612 Personal history of poliomyelitis: Secondary | ICD-10-CM | POA: Diagnosis not present

## 2019-09-07 DIAGNOSIS — I10 Essential (primary) hypertension: Secondary | ICD-10-CM | POA: Diagnosis not present

## 2019-09-07 DIAGNOSIS — E039 Hypothyroidism, unspecified: Secondary | ICD-10-CM | POA: Diagnosis not present

## 2019-09-08 DIAGNOSIS — E039 Hypothyroidism, unspecified: Secondary | ICD-10-CM | POA: Diagnosis not present

## 2019-09-08 DIAGNOSIS — R600 Localized edema: Secondary | ICD-10-CM | POA: Diagnosis not present

## 2019-09-08 DIAGNOSIS — L602 Onychogryphosis: Secondary | ICD-10-CM | POA: Diagnosis not present

## 2019-09-08 DIAGNOSIS — E663 Overweight: Secondary | ICD-10-CM | POA: Diagnosis not present

## 2019-09-08 DIAGNOSIS — Z794 Long term (current) use of insulin: Secondary | ICD-10-CM | POA: Diagnosis not present

## 2019-09-08 DIAGNOSIS — E1142 Type 2 diabetes mellitus with diabetic polyneuropathy: Secondary | ICD-10-CM | POA: Diagnosis not present

## 2019-09-08 DIAGNOSIS — N400 Enlarged prostate without lower urinary tract symptoms: Secondary | ICD-10-CM | POA: Diagnosis not present

## 2019-09-08 DIAGNOSIS — E1351 Other specified diabetes mellitus with diabetic peripheral angiopathy without gangrene: Secondary | ICD-10-CM | POA: Diagnosis not present

## 2019-09-08 DIAGNOSIS — I10 Essential (primary) hypertension: Secondary | ICD-10-CM | POA: Diagnosis not present

## 2019-09-08 DIAGNOSIS — Z6828 Body mass index (BMI) 28.0-28.9, adult: Secondary | ICD-10-CM | POA: Diagnosis not present

## 2019-09-08 DIAGNOSIS — Z8612 Personal history of poliomyelitis: Secondary | ICD-10-CM | POA: Diagnosis not present

## 2019-09-10 DIAGNOSIS — Z8612 Personal history of poliomyelitis: Secondary | ICD-10-CM | POA: Diagnosis not present

## 2019-09-10 DIAGNOSIS — I10 Essential (primary) hypertension: Secondary | ICD-10-CM | POA: Diagnosis not present

## 2019-09-10 DIAGNOSIS — R600 Localized edema: Secondary | ICD-10-CM | POA: Diagnosis not present

## 2019-09-10 DIAGNOSIS — E039 Hypothyroidism, unspecified: Secondary | ICD-10-CM | POA: Diagnosis not present

## 2019-09-10 DIAGNOSIS — N400 Enlarged prostate without lower urinary tract symptoms: Secondary | ICD-10-CM | POA: Diagnosis not present

## 2019-09-10 DIAGNOSIS — Z6828 Body mass index (BMI) 28.0-28.9, adult: Secondary | ICD-10-CM | POA: Diagnosis not present

## 2019-09-10 DIAGNOSIS — E1142 Type 2 diabetes mellitus with diabetic polyneuropathy: Secondary | ICD-10-CM | POA: Diagnosis not present

## 2019-09-10 DIAGNOSIS — E663 Overweight: Secondary | ICD-10-CM | POA: Diagnosis not present

## 2019-09-10 DIAGNOSIS — Z794 Long term (current) use of insulin: Secondary | ICD-10-CM | POA: Diagnosis not present

## 2019-09-15 DIAGNOSIS — E039 Hypothyroidism, unspecified: Secondary | ICD-10-CM | POA: Diagnosis not present

## 2019-09-15 DIAGNOSIS — Z8612 Personal history of poliomyelitis: Secondary | ICD-10-CM | POA: Diagnosis not present

## 2019-09-15 DIAGNOSIS — R600 Localized edema: Secondary | ICD-10-CM | POA: Diagnosis not present

## 2019-09-15 DIAGNOSIS — Z794 Long term (current) use of insulin: Secondary | ICD-10-CM | POA: Diagnosis not present

## 2019-09-15 DIAGNOSIS — I10 Essential (primary) hypertension: Secondary | ICD-10-CM | POA: Diagnosis not present

## 2019-09-15 DIAGNOSIS — E1142 Type 2 diabetes mellitus with diabetic polyneuropathy: Secondary | ICD-10-CM | POA: Diagnosis not present

## 2019-09-15 DIAGNOSIS — N400 Enlarged prostate without lower urinary tract symptoms: Secondary | ICD-10-CM | POA: Diagnosis not present

## 2019-09-15 DIAGNOSIS — E663 Overweight: Secondary | ICD-10-CM | POA: Diagnosis not present

## 2019-09-15 DIAGNOSIS — Z6828 Body mass index (BMI) 28.0-28.9, adult: Secondary | ICD-10-CM | POA: Diagnosis not present

## 2019-09-16 DIAGNOSIS — Z794 Long term (current) use of insulin: Secondary | ICD-10-CM | POA: Diagnosis not present

## 2019-09-16 DIAGNOSIS — Z8612 Personal history of poliomyelitis: Secondary | ICD-10-CM | POA: Diagnosis not present

## 2019-09-16 DIAGNOSIS — R600 Localized edema: Secondary | ICD-10-CM | POA: Diagnosis not present

## 2019-09-16 DIAGNOSIS — Z6828 Body mass index (BMI) 28.0-28.9, adult: Secondary | ICD-10-CM | POA: Diagnosis not present

## 2019-09-16 DIAGNOSIS — E663 Overweight: Secondary | ICD-10-CM | POA: Diagnosis not present

## 2019-09-16 DIAGNOSIS — E1142 Type 2 diabetes mellitus with diabetic polyneuropathy: Secondary | ICD-10-CM | POA: Diagnosis not present

## 2019-09-16 DIAGNOSIS — E039 Hypothyroidism, unspecified: Secondary | ICD-10-CM | POA: Diagnosis not present

## 2019-09-16 DIAGNOSIS — I10 Essential (primary) hypertension: Secondary | ICD-10-CM | POA: Diagnosis not present

## 2019-09-16 DIAGNOSIS — N400 Enlarged prostate without lower urinary tract symptoms: Secondary | ICD-10-CM | POA: Diagnosis not present

## 2019-09-22 DIAGNOSIS — E039 Hypothyroidism, unspecified: Secondary | ICD-10-CM | POA: Diagnosis not present

## 2019-09-22 DIAGNOSIS — Z8612 Personal history of poliomyelitis: Secondary | ICD-10-CM | POA: Diagnosis not present

## 2019-09-22 DIAGNOSIS — E1142 Type 2 diabetes mellitus with diabetic polyneuropathy: Secondary | ICD-10-CM | POA: Diagnosis not present

## 2019-09-22 DIAGNOSIS — R600 Localized edema: Secondary | ICD-10-CM | POA: Diagnosis not present

## 2019-09-22 DIAGNOSIS — I10 Essential (primary) hypertension: Secondary | ICD-10-CM | POA: Diagnosis not present

## 2019-09-22 DIAGNOSIS — N400 Enlarged prostate without lower urinary tract symptoms: Secondary | ICD-10-CM | POA: Diagnosis not present

## 2019-09-22 DIAGNOSIS — Z794 Long term (current) use of insulin: Secondary | ICD-10-CM | POA: Diagnosis not present

## 2019-09-22 DIAGNOSIS — E663 Overweight: Secondary | ICD-10-CM | POA: Diagnosis not present

## 2019-09-22 DIAGNOSIS — Z6828 Body mass index (BMI) 28.0-28.9, adult: Secondary | ICD-10-CM | POA: Diagnosis not present

## 2019-09-24 DIAGNOSIS — I10 Essential (primary) hypertension: Secondary | ICD-10-CM | POA: Diagnosis not present

## 2019-09-24 DIAGNOSIS — Z794 Long term (current) use of insulin: Secondary | ICD-10-CM | POA: Diagnosis not present

## 2019-09-24 DIAGNOSIS — E663 Overweight: Secondary | ICD-10-CM | POA: Diagnosis not present

## 2019-09-24 DIAGNOSIS — Z6828 Body mass index (BMI) 28.0-28.9, adult: Secondary | ICD-10-CM | POA: Diagnosis not present

## 2019-09-24 DIAGNOSIS — R600 Localized edema: Secondary | ICD-10-CM | POA: Diagnosis not present

## 2019-09-24 DIAGNOSIS — N400 Enlarged prostate without lower urinary tract symptoms: Secondary | ICD-10-CM | POA: Diagnosis not present

## 2019-09-24 DIAGNOSIS — E1142 Type 2 diabetes mellitus with diabetic polyneuropathy: Secondary | ICD-10-CM | POA: Diagnosis not present

## 2019-09-24 DIAGNOSIS — E039 Hypothyroidism, unspecified: Secondary | ICD-10-CM | POA: Diagnosis not present

## 2019-09-24 DIAGNOSIS — Z8612 Personal history of poliomyelitis: Secondary | ICD-10-CM | POA: Diagnosis not present

## 2019-09-30 DIAGNOSIS — Z8612 Personal history of poliomyelitis: Secondary | ICD-10-CM | POA: Diagnosis not present

## 2019-09-30 DIAGNOSIS — I10 Essential (primary) hypertension: Secondary | ICD-10-CM | POA: Diagnosis not present

## 2019-09-30 DIAGNOSIS — R600 Localized edema: Secondary | ICD-10-CM | POA: Diagnosis not present

## 2019-09-30 DIAGNOSIS — Z6828 Body mass index (BMI) 28.0-28.9, adult: Secondary | ICD-10-CM | POA: Diagnosis not present

## 2019-09-30 DIAGNOSIS — E1142 Type 2 diabetes mellitus with diabetic polyneuropathy: Secondary | ICD-10-CM | POA: Diagnosis not present

## 2019-09-30 DIAGNOSIS — N400 Enlarged prostate without lower urinary tract symptoms: Secondary | ICD-10-CM | POA: Diagnosis not present

## 2019-09-30 DIAGNOSIS — E039 Hypothyroidism, unspecified: Secondary | ICD-10-CM | POA: Diagnosis not present

## 2019-09-30 DIAGNOSIS — E663 Overweight: Secondary | ICD-10-CM | POA: Diagnosis not present

## 2019-09-30 DIAGNOSIS — Z794 Long term (current) use of insulin: Secondary | ICD-10-CM | POA: Diagnosis not present

## 2019-10-02 DIAGNOSIS — N390 Urinary tract infection, site not specified: Secondary | ICD-10-CM | POA: Diagnosis not present

## 2019-10-02 DIAGNOSIS — R300 Dysuria: Secondary | ICD-10-CM | POA: Diagnosis not present

## 2019-10-02 DIAGNOSIS — E1121 Type 2 diabetes mellitus with diabetic nephropathy: Secondary | ICD-10-CM | POA: Diagnosis not present

## 2019-10-02 DIAGNOSIS — I10 Essential (primary) hypertension: Secondary | ICD-10-CM | POA: Diagnosis not present

## 2019-10-02 DIAGNOSIS — E782 Mixed hyperlipidemia: Secondary | ICD-10-CM | POA: Diagnosis not present

## 2019-10-10 DIAGNOSIS — N281 Cyst of kidney, acquired: Secondary | ICD-10-CM | POA: Diagnosis not present

## 2019-10-10 DIAGNOSIS — R3121 Asymptomatic microscopic hematuria: Secondary | ICD-10-CM | POA: Diagnosis not present

## 2019-10-10 DIAGNOSIS — N401 Enlarged prostate with lower urinary tract symptoms: Secondary | ICD-10-CM | POA: Diagnosis not present

## 2019-10-10 DIAGNOSIS — R300 Dysuria: Secondary | ICD-10-CM | POA: Diagnosis not present

## 2019-10-10 DIAGNOSIS — N3000 Acute cystitis without hematuria: Secondary | ICD-10-CM | POA: Diagnosis not present

## 2019-10-24 DIAGNOSIS — M545 Low back pain: Secondary | ICD-10-CM | POA: Diagnosis not present

## 2019-10-24 DIAGNOSIS — G43009 Migraine without aura, not intractable, without status migrainosus: Secondary | ICD-10-CM | POA: Diagnosis not present

## 2019-10-29 DIAGNOSIS — N481 Balanitis: Secondary | ICD-10-CM | POA: Diagnosis not present

## 2019-10-29 DIAGNOSIS — D414 Neoplasm of uncertain behavior of bladder: Secondary | ICD-10-CM | POA: Diagnosis not present

## 2019-10-29 DIAGNOSIS — R3121 Asymptomatic microscopic hematuria: Secondary | ICD-10-CM | POA: Diagnosis not present

## 2019-11-03 ENCOUNTER — Other Ambulatory Visit: Payer: Self-pay | Admitting: Urology

## 2019-11-19 NOTE — Patient Instructions (Addendum)
DUE TO COVID-19 ONLY ONE VISITOR IS ALLOWED TO COME WITH YOU AND STAY IN THE WAITING ROOM ONLY DURING PRE OP AND PROCEDURE DAY OF SURGERY. THE 1 VISITOR MAY VISIT WITH YOU AFTER SURGERY IN YOUR PRIVATE ROOM DURING VISITING HOURS ONLY!  YOU NEED TO HAVE A COVID 19 TEST ON_6/10______ @__11 :10_____, THIS TEST MUST BE DONE BEFORE SURGERY, COME  [address] [place] , [id number].  ([place]) ONCE YOUR COVID TEST IS COMPLETED, PLEASE BEGIN THE QUARANTINE INSTRUCTIONS AS OUTLINED IN YOUR HANDOUT.                Mike Mack    Your procedure is scheduled on: 12/01/19   Report to [facility] Main  Entrance   Report to admitting at 8:15 AM     Call this number if you have problems the morning of surgery [phone]    Remember: Do not eat food or drink liquids :After Midnight.   BRUSH YOUR TEETH MORNING OF SURGERY AND RINSE YOUR MOUTH OUT, NO CHEWING GUM CANDY OR MINTS.     Take these medicines the morning of surgery with A SIP OF WATER: Metoprolol, Gabapentin, Tamsulosin, Levothyroxine use eye drops  DO NOT TAKE ANY DIABETIC MEDICATIONS DAY OF YOUR SURGERY  How to Manage Your Diabetes Before and After Surgery  Why is it important to control my blood sugar before and after surgery? . Improving blood sugar levels before and after surgery helps healing and can limit problems. . A way of improving blood sugar control is eating a healthy diet by: o  Eating less sugar and carbohydrates o  Increasing activity/exercise o  Talking with your doctor about reaching your blood sugar goals . High blood sugars (greater than 180 mg/dL) can raise your risk of infections and slow your recovery, so you will need to focus on controlling your diabetes during the weeks before surgery. . Make sure that the doctor who takes care of your diabetes knows about your planned surgery including the date and location.  How do I manage my blood sugar before surgery? . Check your  blood sugar at least 4 times a day, starting 2 days before surgery, to make sure that the level is not too high or low. o Check your blood sugar the morning of your surgery when you wake up and every 2 hours until you get to the Short Stay unit. . If your blood sugar is less than 70 mg/dL, you will need to treat for low blood sugar: o Do not take insulin. o Treat a low blood sugar (less than 70 mg/dL) with  cup of clear juice (cranberry or apple), 4 glucose tablets, OR glucose gel. o Recheck blood sugar in 15 minutes after treatment (to make sure it is greater than 70 mg/dL). If your blood sugar is not greater than 70 mg/dL on recheck, call [phone] for further instructions. . Report your blood sugar to the short stay nurse when you get to Short Stay.  . If you are admitted to the hospital after surgery: o Your blood sugar will be checked by the staff and you will probably be given insulin after surgery (instead of oral diabetes medicines) to make sure you have good blood sugar levels. o The goal for blood sugar control after surgery is 80-180 mg/dL.   WHAT DO I DO ABOUT MY DIABETES MEDICATION?  Marland Kitchen Do not take oral diabetes medicines (pills) the morning of surgery.  . THE NIGHT BEFORE SURGERY, take  8  units of   Levemir    insulin.       . THE MORNING OF SURGERY, take  8  units of  Levemir     insulin.  . The day of surgery, do not take other diabetes injectables, including Byetta (exenatide), Bydureon (exenatide ER), Victoza (liraglutide), or Trulicity (dulaglutide).                              You may not have any metal on your body including              piercings  Do not wear jewelry,  lotions, powders or deodorant                     Men may shave face and neck.   Do not bring valuables to the hospital. [place].  Contacts, dentures or bridgework may not be worn into surgery.       Patients discharged the day of surgery  will not be allowed to drive home  . IF YOU ARE HAVING SURGERY AND GOING HOME THE SAME DAY, YOU MUST HAVE AN ADULT TO DRIVE YOU HOME AND BE WITH YOU FOR 24 HOURS.   YOU MAY GO HOME BY TAXI OR UBER OR ORTHERWISE, BUT AN ADULT MUST ACCOMPANY YOU HOME AND STAY WITH YOU FOR 24 HOURS.  Name and phone number of your driver:  Special Instructions: N/A              Please read over the following fact sheets you were given: _____________________________________________________________________             [facility] - Preparing for Surgery Before surgery, you can play an important role.   Because skin is not sterile, your skin needs to be as free of germs as possible.   You can reduce the number of germs on your skin by washing with CHG (chlorahexidine gluconate) soap before surgery.   CHG is an antiseptic cleaner which kills germs and bonds with the skin to continue killing germs even after washing. Please DO NOT use if you have an allergy to CHG or antibacterial soaps .  If your skin becomes reddened/irritated stop using the CHG and inform your nurse when you arrive at Short Stay.   You may shave your face/neck.  Please follow these instructions carefully:  1.  Shower with CHG Soap the night before surgery and the  morning of Surgery.  2.  If you choose to wash your hair, wash your hair first as usual with your  normal  shampoo.  3.  After you shampoo, rinse your hair and body thoroughly to remove the  shampoo.                                        4.  Use CHG as you would any other liquid soap.  You can apply chg directly  to the skin and wash                       Gently with a scrungie or clean washcloth.  5.  Apply the CHG Soap to your body ONLY FROM THE NECK DOWN.   Do not  use on face/ open                           Wound or open sores. Avoid contact with eyes, ears mouth and genitals (private parts).                       Wash face,  Genitals (private parts) with your normal soap.              6.  Wash thoroughly, paying special attention to the area where your surgery  will be performed.  7.  Thoroughly rinse your body with warm water from the neck down.  8.  DO NOT shower/wash with your normal soap after using and rinsing off  the CHG Soap.             9.  Pat yourself dry with a clean towel.            10.  Wear clean pajamas.            11.  Place clean sheets on your bed the night of your first shower and do not  sleep with pets. Day of Surgery : Do not apply any lotions/deodorants the morning of surgery.  Please wear clean clothes to the hospital/surgery center.  FAILURE TO FOLLOW THESE INSTRUCTIONS MAY RESULT IN THE CANCELLATION OF YOUR SURGERY PATIENT SIGNATURE_________________________________  NURSE SIGNATURE__________________________________  ________________________________________________________________________

## 2019-11-20 ENCOUNTER — Encounter (HOSPITAL_COMMUNITY)
Admission: RE | Admit: 2019-11-20 | Discharge: 2019-11-20 | Disposition: A | Discharge status: Home / Self Care | Payer: Medicare HMO | Source: Ambulatory Visit | Attending: Urology | Admitting: Urology

## 2019-11-20 ENCOUNTER — Other Ambulatory Visit: Payer: Self-pay

## 2019-11-20 ENCOUNTER — Encounter (HOSPITAL_COMMUNITY): Payer: Self-pay

## 2019-11-20 DIAGNOSIS — Z01812 Encounter for preprocedural laboratory examination: Secondary | ICD-10-CM | POA: Insufficient documentation

## 2019-11-20 LAB — BASIC METABOLIC PANEL
Anion gap: 7 (ref 5–15)
BUN: 17 mg/dL (ref 8–23)
CO2: 26 mmol/L (ref 22–32)
Calcium: 8.6 mg/dL — ABNORMAL LOW (ref 8.9–10.3)
Chloride: 103 mmol/L (ref 98–111)
Creatinine, Ser: 1.35 mg/dL — ABNORMAL HIGH (ref 0.61–1.24)
GFR calc Af Amer: 57 mL/min — ABNORMAL LOW (ref >60)
GFR calc non Af Amer: 49 mL/min — ABNORMAL LOW (ref >60)
Glucose, Bld: 268 mg/dL — ABNORMAL HIGH (ref 70–99)
Potassium: 4.9 mmol/L (ref 3.5–5.1)
Sodium: 136 mmol/L (ref 135–145)

## 2019-11-20 LAB — SURGICAL PCR SCREEN
MRSA, PCR: NEGATIVE
Staphylococcus aureus: NEGATIVE

## 2019-11-20 LAB — CBC
HCT: 46.3 % (ref 39.0–52.0)
Hemoglobin: 14.9 g/dL (ref 13.0–17.0)
MCH: 32.3 pg (ref 26.0–34.0)
MCHC: 32.2 g/dL (ref 30.0–36.0)
MCV: 100.2 fL — ABNORMAL HIGH (ref 80.0–100.0)
Platelets: 179 10*3/uL (ref 150–400)
RBC: 4.62 MIL/uL (ref 4.22–5.81)
RDW: 13.7 % (ref 11.5–15.5)
WBC: 6.2 10*3/uL (ref 4.0–10.5)
nRBC: 0 % (ref 0.0–0.2)

## 2019-11-20 LAB — HEMOGLOBIN A1C
Hgb A1c MFr Bld: 8.1 % — ABNORMAL HIGH (ref 4.8–5.6)
Mean Plasma Glucose: 185.77 mg/dL

## 2019-11-20 NOTE — Progress Notes (Signed)
COVID Vaccine Completed:Yes Date COVID Vaccine completed:10/16/19 COVID vaccine manufacturer:   Moderna     PCP - Dr. Georges Mouse Cardiologist - no  Chest x-ray - no EKG - 09/03/19 Stress Test - no ECHO - 2016 Cardiac Cath -   Sleep Study - no CPAP -   Fasting Blood Sugar - 98-140 Checks Blood Sugar _BID____ times a day  Blood Thinner Instructions:ASA Aspirin Instructions:Dr. Bell said to stop it 5 days prior to DOS Last Dose:11/25/19  Anesthesia review:   Patient denies shortness of breath, fever, cough and chest pain at PAT appointment yes  Patient verbalized understanding of instructions that were given to them at the PAT appointment. Patient was also instructed that they will need to review over the PAT instructions again at home before surgery. Yes Pt has Lt side weakness from CVA. He uses a walker.

## 2019-11-27 ENCOUNTER — Other Ambulatory Visit (HOSPITAL_COMMUNITY)
Admission: RE | Admit: 2019-11-27 | Discharge: 2019-11-27 | Disposition: A | Discharge status: Home / Self Care | Payer: Medicare HMO | Source: Ambulatory Visit | Attending: Urology | Admitting: Urology

## 2019-11-27 DIAGNOSIS — Z20822 Contact with and (suspected) exposure to covid-19: Secondary | ICD-10-CM | POA: Insufficient documentation

## 2019-11-27 DIAGNOSIS — Z01812 Encounter for preprocedural laboratory examination: Secondary | ICD-10-CM | POA: Diagnosis not present

## 2019-11-27 LAB — SARS CORONAVIRUS 2 (TAT 6-24 HRS): SARS Coronavirus 2: NEGATIVE

## 2019-12-01 ENCOUNTER — Encounter (HOSPITAL_COMMUNITY): Payer: Self-pay | Admitting: Urology

## 2019-12-01 ENCOUNTER — Encounter (HOSPITAL_COMMUNITY)
Admission: RE | Disposition: A | Discharge status: Home / Self Care | Payer: Self-pay | Source: Home / Self Care | Attending: Urology

## 2019-12-01 ENCOUNTER — Ambulatory Visit (HOSPITAL_COMMUNITY): Payer: Medicare HMO | Admitting: Certified Registered Nurse Anesthetist

## 2019-12-01 ENCOUNTER — Ambulatory Visit (HOSPITAL_COMMUNITY)
Admission: RE | Admit: 2019-12-01 | Discharge: 2019-12-01 | Disposition: A | Discharge status: Home / Self Care | Payer: Medicare HMO | Attending: Urology | Admitting: Urology

## 2019-12-01 ENCOUNTER — Ambulatory Visit (HOSPITAL_COMMUNITY): Payer: Medicare HMO | Admitting: Physician Assistant

## 2019-12-01 DIAGNOSIS — Z79899 Other long term (current) drug therapy: Secondary | ICD-10-CM | POA: Diagnosis not present

## 2019-12-01 DIAGNOSIS — Z7982 Long term (current) use of aspirin: Secondary | ICD-10-CM | POA: Diagnosis not present

## 2019-12-01 DIAGNOSIS — N400 Enlarged prostate without lower urinary tract symptoms: Secondary | ICD-10-CM | POA: Diagnosis not present

## 2019-12-01 DIAGNOSIS — N329 Bladder disorder, unspecified: Secondary | ICD-10-CM | POA: Diagnosis not present

## 2019-12-01 DIAGNOSIS — Z794 Long term (current) use of insulin: Secondary | ICD-10-CM | POA: Insufficient documentation

## 2019-12-01 DIAGNOSIS — I10 Essential (primary) hypertension: Secondary | ICD-10-CM | POA: Insufficient documentation

## 2019-12-01 DIAGNOSIS — Z8612 Personal history of poliomyelitis: Secondary | ICD-10-CM | POA: Diagnosis not present

## 2019-12-01 DIAGNOSIS — N3289 Other specified disorders of bladder: Secondary | ICD-10-CM | POA: Diagnosis not present

## 2019-12-01 DIAGNOSIS — E1140 Type 2 diabetes mellitus with diabetic neuropathy, unspecified: Secondary | ICD-10-CM | POA: Diagnosis not present

## 2019-12-01 DIAGNOSIS — Z79890 Hormone replacement therapy (postmenopausal): Secondary | ICD-10-CM | POA: Insufficient documentation

## 2019-12-01 DIAGNOSIS — E039 Hypothyroidism, unspecified: Secondary | ICD-10-CM | POA: Insufficient documentation

## 2019-12-01 DIAGNOSIS — M1909 Primary osteoarthritis, other specified site: Secondary | ICD-10-CM | POA: Insufficient documentation

## 2019-12-01 DIAGNOSIS — N3020 Other chronic cystitis without hematuria: Secondary | ICD-10-CM | POA: Diagnosis not present

## 2019-12-01 DIAGNOSIS — Z8673 Personal history of transient ischemic attack (TIA), and cerebral infarction without residual deficits: Secondary | ICD-10-CM | POA: Insufficient documentation

## 2019-12-01 DIAGNOSIS — E1165 Type 2 diabetes mellitus with hyperglycemia: Secondary | ICD-10-CM | POA: Diagnosis not present

## 2019-12-01 HISTORY — PX: CYSTOSCOPY WITH BIOPSY: SHX5122

## 2019-12-01 LAB — GLUCOSE, CAPILLARY
Glucose-Capillary: 157 mg/dL — ABNORMAL HIGH (ref 70–99)
Glucose-Capillary: 124 mg/dL — ABNORMAL HIGH (ref 70–99)

## 2019-12-01 SURGERY — CYSTOSCOPY, WITH BIOPSY
Anesthesia: General

## 2019-12-01 MED ORDER — CEFAZOLIN SODIUM-DEXTROSE 2-4 GM/100ML-% IV SOLN
2.0000 g | INTRAVENOUS | Status: AC
  Administered 2019-12-01: 2 g via INTRAVENOUS
  Filled 2019-12-01: qty 100

## 2019-12-01 MED ORDER — EPHEDRINE 5 MG/ML INJ
INTRAVENOUS | Status: AC
  Filled 2019-12-01: qty 10

## 2019-12-01 MED ORDER — PROPOFOL 10 MG/ML IV BOLUS
INTRAVENOUS | Status: AC
  Filled 2019-12-01: qty 20

## 2019-12-01 MED ORDER — ORAL CARE MOUTH RINSE: 15.0000 mL | Freq: Once | OROMUCOSAL | Status: AC

## 2019-12-01 MED ORDER — FENTANYL CITRATE (PF) 100 MCG/2ML IJ SOLN: 25.0000 ug | INTRAMUSCULAR | Status: DC | PRN

## 2019-12-01 MED ORDER — PROPOFOL 10 MG/ML IV BOLUS
INTRAVENOUS | Status: DC | PRN
  Administered 2019-12-01: 160 mg via INTRAVENOUS

## 2019-12-01 MED ORDER — SODIUM CHLORIDE 0.9 % IR SOLN
Status: DC | PRN
  Administered 2019-12-01: 6000 mL

## 2019-12-01 MED ORDER — DEXAMETHASONE SODIUM PHOSPHATE 10 MG/ML IJ SOLN
INTRAMUSCULAR | Status: DC | PRN
  Administered 2019-12-01: 4 mg via INTRAVENOUS

## 2019-12-01 MED ORDER — 0.9 % SODIUM CHLORIDE (POUR BTL) OPTIME
TOPICAL | Status: DC | PRN
  Administered 2019-12-01: 1000 mL

## 2019-12-01 MED ORDER — ONDANSETRON HCL 4 MG/2ML IJ SOLN
INTRAMUSCULAR | Status: DC | PRN
  Administered 2019-12-01: 4 mg via INTRAVENOUS

## 2019-12-01 MED ORDER — FENTANYL CITRATE (PF) 100 MCG/2ML IJ SOLN
INTRAMUSCULAR | Status: AC
  Filled 2019-12-01: qty 2

## 2019-12-01 MED ORDER — LIDOCAINE 2% (20 MG/ML) 5 ML SYRINGE
INTRAMUSCULAR | Status: AC
  Filled 2019-12-01: qty 5

## 2019-12-01 MED ORDER — ACETAMINOPHEN 10 MG/ML IV SOLN: 1000.0000 mg | Freq: Once | INTRAVENOUS | Status: DC | PRN

## 2019-12-01 MED ORDER — FENTANYL CITRATE (PF) 100 MCG/2ML IJ SOLN
INTRAMUSCULAR | Status: DC | PRN
  Administered 2019-12-01: 50 ug via INTRAVENOUS

## 2019-12-01 MED ORDER — ONDANSETRON HCL 4 MG/2ML IJ SOLN: 4.0000 mg | Freq: Once | INTRAMUSCULAR | Status: DC | PRN

## 2019-12-01 MED ORDER — MEPERIDINE HCL 50 MG/ML IJ SOLN: 6.2500 mg | INTRAMUSCULAR | Status: DC | PRN

## 2019-12-01 MED ORDER — DEXAMETHASONE SODIUM PHOSPHATE 10 MG/ML IJ SOLN
INTRAMUSCULAR | Status: AC
  Filled 2019-12-01: qty 1

## 2019-12-01 MED ORDER — STERILE WATER FOR IRRIGATION IR SOLN
Status: DC | PRN
  Administered 2019-12-01: 10 mL

## 2019-12-01 MED ORDER — HYDROCODONE-ACETAMINOPHEN 5-325 MG PO TABS: 1.0000 | ORAL_TABLET | ORAL | 0 refills | Status: DC | PRN

## 2019-12-01 MED ORDER — CHLORHEXIDINE GLUCONATE 0.12 % MT SOLN
15.0000 mL | Freq: Once | OROMUCOSAL | Status: AC
  Administered 2019-12-01: 15 mL via OROMUCOSAL

## 2019-12-01 MED ORDER — ONDANSETRON HCL 4 MG/2ML IJ SOLN
INTRAMUSCULAR | Status: AC
  Filled 2019-12-01: qty 2

## 2019-12-01 MED ORDER — LACTATED RINGERS IV SOLN: INTRAVENOUS | Status: DC

## 2019-12-01 MED ORDER — EPHEDRINE SULFATE-NACL 50-0.9 MG/10ML-% IV SOSY
PREFILLED_SYRINGE | INTRAVENOUS | Status: DC | PRN
  Administered 2019-12-01: 5 mg via INTRAVENOUS
  Administered 2019-12-01: 10 mg via INTRAVENOUS
  Administered 2019-12-01: 5 mg via INTRAVENOUS

## 2019-12-01 SURGICAL SUPPLY — 18 items
BAG URINE DRAIN 2000ML AR STRL (UROLOGICAL SUPPLIES) ×3 IMPLANT
BAG URO CATCHER STRL LF (MISCELLANEOUS) ×3 IMPLANT
CATH FOLEY 2WAY SLVR  5CC 18FR (CATHETERS) ×3
CATH FOLEY 2WAY SLVR 5CC 18FR (CATHETERS) ×1 IMPLANT
ELECT REM PT RETURN 15FT ADLT (MISCELLANEOUS) ×3 IMPLANT
GLOVE BIO SURGEON STRL SZ7.5 (GLOVE) ×3 IMPLANT
GOWN STRL REUS W/TWL XL LVL3 (GOWN DISPOSABLE) ×3 IMPLANT
KIT TURNOVER KIT A (KITS) IMPLANT
LOOP CUT BIPOLAR 24F LRG (ELECTROSURGICAL) IMPLANT
MANIFOLD NEPTUNE II (INSTRUMENTS) ×3 IMPLANT
PENCIL SMOKE EVACUATOR (MISCELLANEOUS) IMPLANT
PLUG CATH AND CAP STER (CATHETERS) IMPLANT
SYR TOOMEY IRRIG 70ML (MISCELLANEOUS)
SYRINGE TOOMEY IRRIG 70ML (MISCELLANEOUS) IMPLANT
TRAY CYSTO PACK (CUSTOM PROCEDURE TRAY) ×3 IMPLANT
TUBING CONNECTING 10 (TUBING) ×2 IMPLANT
TUBING CONNECTING 10' (TUBING) ×1
TUBING UROLOGY SET (TUBING) ×3 IMPLANT

## 2019-12-01 NOTE — H&P (Signed)
H&P  Chief Complaint: Bladder lesion  History of Present Illness: 84 year old male with a raised area of erythema presents for bladder biopsy and fulguration  Past Medical History:  Diagnosis Date  . Anemia 05/13/2013  . Arthritis    "right leg" (07/25/2013)  . BPH (benign prostatic hyperplasia)   . Constipation 05/13/2013  . Exertional shortness of breath    "sometimes" (07/25/2013)  . History of kidney stones   . History of stomach ulcers   . Hypertension   . Neuropathy   . Polio   . Polio Childhood  . Small bowel obstruction ([place])   . Stroke [facility]) 2014   residual:  "left hand kind of numb" (07/25/2013)  . Type II diabetes mellitus ([place])    Past Surgical History:  Procedure Laterality Date  . BOWEL RESECTION    . CATARACT EXTRACTION W/ INTRAOCULAR LENS  IMPLANT, BILATERAL Bilateral 2000's  . CHOLECYSTECTOMY N/A 07/25/2013   Procedure: LAPAROSCOPIC CHOLECYSTECTOMY WITH INTRAOPERATIVE CHOLANGIOGRAM;  Surgeon: [name], MD;  Location: [facility];  Service: General;  Laterality: N/A;  . COLON SURGERY    . COLONOSCOPY     Hx: of  . EXCISIONAL HEMORRHOIDECTOMY  2000's  . EXTRACORPOREAL SHOCK WAVE LITHOTRIPSY Right 10/03/2018   Procedure: EXTRACORPOREAL SHOCK WAVE LITHOTRIPSY (ESWL);  Surgeon: Festus Aloe, MD;  Location: [facility];  Service: Urology;  Laterality: Right;  . EYE SURGERY    . INGUINAL HERNIA REPAIR Right 1970's  . LAPAROSCOPIC CHOLECYSTECTOMY  07/25/2013   w/LOA (07/25/2013)    Home Medications:  Medications Prior to Admission  Medication Sig Dispense Refill Last Dose  . aspirin EC 81 MG tablet Take 81 mg by mouth daily.   Past Week at Unknown time  . atorvastatin (LIPITOR) 20 MG tablet Take 20 mg by mouth every evening.    11/30/2019 at Unknown time  . candesartan (ATACAND) 16 MG tablet Take 16 mg by mouth daily.    11/30/2019 at Unknown time  . cholecalciferol (VITAMIN D) 25 MCG (1000 UT) tablet Take 1,000 Units by mouth daily.   11/30/2019 at Unknown time  .  COMBIGAN 0.2-0.5 % ophthalmic solution Place 1 drop into both eyes 2 (two) times daily.  4 12/01/2019 at 0645  . gabapentin (NEURONTIN) 800 MG tablet Take 800 mg by mouth 2 (two) times daily.   0 12/01/2019 at 0645  . glucose blood test strip    11/30/2019 at Unknown time  . insulin detemir (LEVEMIR) 100 UNIT/ML injection Inject 16 Units into the skin 2 (two) times daily.    11/30/2019 at Unknown time  . latanoprost (XALATAN) 0.005 % ophthalmic solution Place 1 drop into both eyes at bedtime.    12/01/2019 at [place]  . levothyroxine (SYNTHROID) 88 MCG tablet Take 88 mcg by mouth daily before breakfast.    12/01/2019 at 0645  . metoprolol tartrate (LOPRESSOR) 25 MG tablet Take 25 mg by mouth 2 (two) times daily.   12/01/2019 at [place]  . pioglitazone (ACTOS) 15 MG tablet Take 7.5 mg by mouth daily.    11/30/2019 at Unknown time  . Tamsulosin HCl (FLOMAX) 0.4 MG CAPS Take 0.4 mg by mouth 2 (two) times daily.    12/01/2019 at [place]  . Travoprost, BAK Free, (TRAVATAN) 0.004 % SOLN ophthalmic solution Place 1 drop into both eyes daily.    12/01/2019 at [place]  . vitamin B-12 (CYANOCOBALAMIN) 1000 MCG tablet Take 1,000 mcg by mouth daily.   11/30/2019 at Unknown time  . Multiple Vitamin (MULTIVITAMIN WITH MINERALS)  TABS tablet Take 1 tablet by mouth daily. (Patient not taking: Reported on 03/23/2019)     . ondansetron (ZOFRAN ODT) 4 MG disintegrating tablet 4mg  ODT q4 hours prn nausea/vomit (Patient not taking: Reported on 11/12/2019) 10 tablet 0 Not Taking at Unknown time  . polyethylene glycol (MIRALAX / GLYCOLAX) packet Take 17 g by mouth daily. (Patient not taking: Reported on 11/12/2019) 14 each 0 Not Taking at Unknown time   Allergies:  Allergies  Allergen Reactions  . Metformin And Related Other (See Comments)    Gi intolerance  . [name] [Insulin Glargine-Lixisenatide] Other (See Comments)    Stomach cramps    Family History  Problem Relation Age of Onset  . Heart failure Mother   . Diabetes Sister   .  Diabetes Sister   . Diabetes Brother   . Hypertension Sister   . Hypertension Sister   . Hypertension Brother   . Heart attack Sister    Social History:  reports that he has never smoked. He has never used smokeless tobacco. He reports that he does not drink alcohol and does not use drugs.  ROS: A complete review of systems was performed.  All systems are negative except for pertinent findings as noted. ROS   Physical Exam:  Vital signs in last 24 hours: Temp:  [98.5 F (36.9 C)] 98.5 F (36.9 C) (06/14 0809) Pulse Rate:  [63] 63 (06/14 0809) Resp:  [17] 17 (06/14 0809) BP: (191)/(67) 191/67 (06/14 0809) SpO2:  [99 %] 99 % (06/14 0809) General:  Alert and oriented, No acute distress HEENT: Normocephalic, atraumatic Neck: No JVD or lymphadenopathy Cardiovascular: Regular rate and rhythm Lungs: Regular rate and effort Abdomen: Soft, nontender, nondistended, no abdominal masses Back: No CVA tenderness Extremities: No edema Neurologic: Grossly intact  Laboratory Data:  Results for orders placed or performed during the hospital encounter of 12/01/19 (from the past 24 hour(s))  Glucose, capillary     Status: Abnormal   Collection Time: 12/01/19  8:17 AM  Result Value Ref Range   Glucose-Capillary 157 (H) 70 - 99 mg/dL   Recent Results (from the past 240 hour(s))  SARS CORONAVIRUS 2 (TAT 6-24 HRS) Nasopharyngeal Nasopharyngeal Swab     Status: None   Collection Time: 11/27/19 11:02 AM   Specimen: Nasopharyngeal Swab  Result Value Ref Range Status   SARS Coronavirus 2 NEGATIVE NEGATIVE Final    Comment: (NOTE) SARS-CoV-2 target nucleic acids are NOT DETECTED.  The SARS-CoV-2 RNA is generally detectable in upper and lower respiratory specimens during the acute phase of infection. Negative results do not preclude SARS-CoV-2 infection, do not rule out co-infections with other pathogens, and should not be used as the sole basis for treatment or other patient management  decisions. Negative results must be combined with clinical observations, patient history, and epidemiological information. The expected result is Negative.  Fact Sheet for Patients: SugarRoll.be  Fact Sheet for Healthcare Providers: https://www.woods-mathews.com/  This test is not yet approved or cleared by the [place] FDA and  has been authorized for detection and/or diagnosis of SARS-CoV-2 by FDA under an Emergency Use Authorization (EUA). This EUA will remain  in effect (meaning this test can be used) for the duration of the COVID-19 declaration under Se ction 564(b)(1) of the Act, 21 U.S.C. section 360bbb-3(b)(1), unless the authorization is terminated or revoked sooner.  Performed at [facility], [place] [address], [place], [place] [address]    Creatinine: No results for input(s): CREATININE in the last  168 hours.  Impression/Assessment:  Bladder lesion  Plan:  Proceed with cystoscopy with bladder biopsy and fulguration  Marton Redwood, III 12/01/2019, 9:39 AM

## 2019-12-01 NOTE — Anesthesia Preprocedure Evaluation (Signed)
Anesthesia Evaluation  Patient identified by MRN, date of birth, ID band Patient awake    Reviewed: Allergy & Precautions, H&P , NPO status , Patient's Chart, lab work & pertinent test results, reviewed documented beta blocker date and time   Airway Mallampati: II  TM Distance: >3 FB Neck ROM: full    Dental  (+) Edentulous Upper, Edentulous Lower, Dental Advidsory Given   Pulmonary shortness of breath,    breath sounds clear to auscultation       Cardiovascular hypertension, Pt. on medications and Pt. on home beta blockers Normal cardiovascular exam Rhythm:regular Rate:Normal     Neuro/Psych  Headaches, CVA, No Residual Symptoms    GI/Hepatic negative GI ROS, Neg liver ROS,   Endo/Other  diabetes, Well Controlled, Type 2, Insulin DependentHypothyroidism   Renal/GU Renal InsufficiencyRenal disease  negative genitourinary   Musculoskeletal   Abdominal Normal abdominal exam  (+)   Peds  Hematology  (+) anemia ,   Anesthesia Other Findings Conclusions   - Left ventricle: The cavity size was normal. Wall thickness was  normal. Systolic function was normal. The estimated ejection  fraction was in the range of 60% to 65%. Wall motion was normal;  there were no regional wall motion abnormalities. Doppler  parameters are consistent with abnormal left ventricular  relaxation (grade 1 diastolic dysfunction). The E/e&' ratio is  between 8-15, suggesting indeterminate LV filling pressure.  - Left atrium: The atrium was normal in size.   Reproductive/Obstetrics negative OB ROS                             Anesthesia Physical  Anesthesia Plan  ASA: II  Anesthesia Plan: General   Post-op Pain Management:    Induction: Intravenous  PONV Risk Score and Plan: 4 or greater and Ondansetron  Airway Management Planned: LMA  Additional Equipment: None  Intra-op Plan:   Post-operative  Plan: Extubation in OR  Informed Consent: I have reviewed the patients History and Physical, chart, labs and discussed the procedure including the risks, benefits and alternatives for the proposed anesthesia with the patient or authorized representative who has indicated his/her understanding and acceptance.     Dental Advisory Given  Plan Discussed with: CRNA  Anesthesia Plan Comments:         Anesthesia Quick Evaluation

## 2019-12-01 NOTE — Anesthesia Procedure Notes (Signed)
Procedure Name: LMA Insertion Date/Time: 12/01/2019 10:08 AM Performed by: Eben Burow, CRNA Pre-anesthesia Checklist: Patient identified, Emergency Drugs available, Suction available, Patient being monitored and Timeout performed Patient Re-evaluated:Patient Re-evaluated prior to induction Oxygen Delivery Method: Circle system utilized Preoxygenation: Pre-oxygenation with 100% oxygen Induction Type: IV induction Ventilation: Mask ventilation without difficulty LMA: LMA inserted LMA Size: 4.0 Tube secured with: Tape Dental Injury: Teeth and Oropharynx as per pre-operative assessment

## 2019-12-01 NOTE — Progress Notes (Signed)
Pt is alert but oriented only to self. He was unable to tell me the year or what procedure he was having done. Wife is at bedside and states this is his baseline and that "he is getting more and more forgetful"

## 2019-12-01 NOTE — Transfer of Care (Signed)
Immediate Anesthesia Transfer of Care Note  Patient: Mike Mack  Procedure(s) Performed: CYSTOSCOPY WITH BLADDER BIOPSY AND FULGURATION (N/A )  Patient Location: PACU  Anesthesia Type:General  Level of Consciousness: awake, drowsy and patient cooperative  Airway & Oxygen Therapy: Patient Spontanous Breathing and Patient connected to face mask oxygen  Post-op Assessment: Report given to RN and Post -op Vital signs reviewed and stable  Post vital signs: Reviewed and stable  Last Vitals:  Vitals Value Taken Time  BP 139/123 12/01/19 1056  Temp    Pulse 68 12/01/19 1100  Resp 16 12/01/19 1100  SpO2 100 % 12/01/19 1100  Vitals shown include unvalidated device data.  Last Pain:  Vitals:   12/01/19 0809  TempSrc: Oral         Complications: No complications documented.

## 2019-12-01 NOTE — Op Note (Signed)
Operative Note  Preoperative diagnosis:  1.  Bladder lesion  Postoperative diagnosis: 1.  Bladder lesion  Procedure(s): 1.  Cystoscopy with bladder biopsy and fulguration  Surgeon: Link Snuffer, MD  Assistants: None  Anesthesia: General  Complications: None immediate  EBL: Minimal  Specimens: 1.  Bladder biopsy x2  Drains/Catheters: 1.  18 French Foley catheter  Intraoperative findings: 1.  Normal anterior urethra 2.  Moderately obstructing prostate with median lobe. 3.  Approximately 2 cm area of erythema biopsied and fulgurated that was on the anterior bladder wall  Indication: 84 year old male with a bladder lesion presents for bladder biopsy and fulguration  Description of procedure:  The patient was identified and consent was obtained.  The patient was taken to the operating room and placed in the supine position.  The patient was placed under general anesthesia.  Perioperative antibiotics were administered.  The patient was placed in dorsal lithotomy.  Patient was prepped and draped in a standard sterile fashion and a timeout was performed.  A 21 French cystoscope was advanced into the urethra and into the bladder.  Complete cystoscopy was performed with findings noted above.  I used biopsy forceps to take 2 bladder biopsies and then fulgurated the biopsy bed and surrounding area with monopolar Bugbee.  There was some mild amount of bleeding from the prostate and a small false passage at the anterior bladder neck therefore I left a 18 French Foley catheter to remain for a couple of days.  This concluded the operation.  Patient tolerated the procedure well and was stable postoperatively.  Plan: Return in 2 days for voiding trial.

## 2019-12-01 NOTE — Discharge Instructions (Addendum)
Transurethral Resection of Bladder Tumor (TURBT) or Bladder Biopsy ° ° °Definition: ° Transurethral Resection of the Bladder Tumor is a surgical procedure used to diagnose and remove tumors within the bladder. TURBT is the most common treatment for early stage bladder cancer. ° °General instructions: °   ° Your recent bladder surgery requires very little post hospital care but some definite precautions. ° °Despite the fact that no skin incisions were used, the area around the bladder incisions are raw and covered with scabs to promote healing and prevent bleeding. Certain precautions are needed to insure that the scabs are not disturbed over the next 2-4 weeks while the healing proceeds. ° °Because the raw surface inside your bladder and the irritating effects of urine you may expect frequency of urination and/or urgency (a stronger desire to urinate) and perhaps even getting up at night more often. This will usually resolve or improve slowly over the healing period. You may see some blood in your urine over the first 6 weeks. Do not be alarmed, even if the urine was clear for a while. Get off your feet and drink lots of fluids until clearing occurs. If you start to pass clots or don't improve call us. ° °Diet: ° °You may return to your normal diet immediately. Because of the raw surface of your bladder, alcohol, spicy foods, foods high in acid and drinks with caffeine may cause irritation or frequency and should be used in moderation. To keep your urine flowing freely and avoid constipation, drink plenty of fluids during the day (8-10 glasses). Tip: Avoid cranberry juice because it is very acidic. ° °Activity: ° °Your physical activity doesn't need to be restricted. However, if you are very active, you may see some blood in the urine. We suggest that you reduce your activity under the circumstances until the bleeding has stopped. ° °Bowels: ° °It is important to keep your bowels regular during the postoperative  period. Straining with bowel movements can cause bleeding. A bowel movement every other day is reasonable. Use a mild laxative if needed, such as milk of magnesia 2-3 tablespoons, or 2 Dulcolax tablets. Call if you continue to have problems. If you had been taking narcotics for pain, before, during or after your surgery, you may be constipated. Take a laxative if necessary. ° ° ° °Medication: ° °You should resume your pre-surgery medications unless told not to. In addition you may be given an antibiotic to prevent or treat infection. Antibiotics are not always necessary. All medication should be taken as prescribed until the bottles are finished unless you are having an unusual reaction to one of the drugs. ° ° ° ° ° °

## 2019-12-02 ENCOUNTER — Encounter (HOSPITAL_COMMUNITY): Payer: Self-pay | Admitting: Urology

## 2019-12-02 LAB — SURGICAL PATHOLOGY

## 2019-12-02 NOTE — Anesthesia Postprocedure Evaluation (Signed)
Anesthesia Post Note  Patient: Mike Mack  Procedure(s) Performed: CYSTOSCOPY WITH BLADDER BIOPSY AND FULGURATION (N/A )     Patient location during evaluation: PACU Anesthesia Type: General Level of consciousness: awake Pain management: pain level controlled Vital Signs Assessment: post-procedure vital signs reviewed and stable Respiratory status: spontaneous breathing Cardiovascular status: stable Postop Assessment: no apparent nausea or vomiting Anesthetic complications: no   No complications documented.  Last Vitals:  Vitals:   12/01/19 1245 12/01/19 1300  BP:  (!) 164/69  Pulse: 70 (!) 127  Resp:    Temp:    SpO2: 96% 94%    Last Pain:  Vitals:   12/01/19 1242  TempSrc: Oral  PainSc:                  [name]

## 2019-12-04 ENCOUNTER — Encounter (INDEPENDENT_AMBULATORY_CARE_PROVIDER_SITE_OTHER): Payer: Medicare HMO | Admitting: Ophthalmology

## 2019-12-05 DIAGNOSIS — E1169 Type 2 diabetes mellitus with other specified complication: Secondary | ICD-10-CM | POA: Diagnosis not present

## 2019-12-05 DIAGNOSIS — Z0000 Encounter for general adult medical examination without abnormal findings: Secondary | ICD-10-CM | POA: Diagnosis not present

## 2019-12-05 DIAGNOSIS — D414 Neoplasm of uncertain behavior of bladder: Secondary | ICD-10-CM | POA: Diagnosis not present

## 2019-12-05 DIAGNOSIS — I739 Peripheral vascular disease, unspecified: Secondary | ICD-10-CM | POA: Diagnosis not present

## 2019-12-07 ENCOUNTER — Other Ambulatory Visit: Payer: Self-pay

## 2019-12-07 ENCOUNTER — Emergency Department (HOSPITAL_COMMUNITY)
Admission: EM | Admit: 2019-12-07 | Discharge: 2019-12-08 | Disposition: A | Discharge status: Home / Self Care | Payer: Medicare HMO | Attending: Emergency Medicine | Admitting: Emergency Medicine

## 2019-12-07 DIAGNOSIS — Z882 Allergy status to sulfonamides status: Secondary | ICD-10-CM | POA: Insufficient documentation

## 2019-12-07 DIAGNOSIS — R140 Abdominal distension (gaseous): Secondary | ICD-10-CM | POA: Insufficient documentation

## 2019-12-07 DIAGNOSIS — E119 Type 2 diabetes mellitus without complications: Secondary | ICD-10-CM | POA: Insufficient documentation

## 2019-12-07 DIAGNOSIS — R300 Dysuria: Secondary | ICD-10-CM | POA: Diagnosis not present

## 2019-12-07 DIAGNOSIS — Z7982 Long term (current) use of aspirin: Secondary | ICD-10-CM | POA: Diagnosis not present

## 2019-12-07 DIAGNOSIS — Z794 Long term (current) use of insulin: Secondary | ICD-10-CM | POA: Diagnosis not present

## 2019-12-07 DIAGNOSIS — R39198 Other difficulties with micturition: Secondary | ICD-10-CM | POA: Diagnosis present

## 2019-12-07 DIAGNOSIS — I10 Essential (primary) hypertension: Secondary | ICD-10-CM | POA: Diagnosis not present

## 2019-12-07 LAB — CBC WITH DIFFERENTIAL/PLATELET
Abs Immature Granulocytes: 0.03 10*3/uL (ref 0.00–0.07)
Basophils Absolute: 0 10*3/uL (ref 0.0–0.1)
Basophils Relative: 0 %
Eosinophils Absolute: 0 10*3/uL (ref 0.0–0.5)
Eosinophils Relative: 0 %
HCT: 49.7 % (ref 39.0–52.0)
Hemoglobin: 16.3 g/dL (ref 13.0–17.0)
Immature Granulocytes: 0 %
Lymphocytes Relative: 20 %
Lymphs Abs: 1.7 10*3/uL (ref 0.7–4.0)
MCH: 32.3 pg (ref 26.0–34.0)
MCHC: 32.8 g/dL (ref 30.0–36.0)
MCV: 98.4 fL (ref 80.0–100.0)
Monocytes Absolute: 0.6 10*3/uL (ref 0.1–1.0)
Monocytes Relative: 7 %
Neutro Abs: 6 10*3/uL (ref 1.7–7.7)
Neutrophils Relative %: 73 %
Platelets: 227 10*3/uL (ref 150–400)
RBC: 5.05 MIL/uL (ref 4.22–5.81)
RDW: 13.2 % (ref 11.5–15.5)
WBC: 8.4 10*3/uL (ref 4.0–10.5)
nRBC: 0 % (ref 0.0–0.2)

## 2019-12-07 LAB — BASIC METABOLIC PANEL
Anion gap: 9 (ref 5–15)
BUN: 19 mg/dL (ref 8–23)
CO2: 26 mmol/L (ref 22–32)
Calcium: 9.3 mg/dL (ref 8.9–10.3)
Chloride: 102 mmol/L (ref 98–111)
Creatinine, Ser: 1.31 mg/dL — ABNORMAL HIGH (ref 0.61–1.24)
GFR calc Af Amer: 59 mL/min — ABNORMAL LOW (ref >60)
GFR calc non Af Amer: 51 mL/min — ABNORMAL LOW (ref >60)
Glucose, Bld: 236 mg/dL — ABNORMAL HIGH (ref 70–99)
Potassium: 4.2 mmol/L (ref 3.5–5.1)
Sodium: 137 mmol/L (ref 135–145)

## 2019-12-07 NOTE — ED Triage Notes (Signed)
Patient states that he having problems urinating. Patient is also complaining about when he urinates it is painful urination.

## 2019-12-07 NOTE — ED Provider Notes (Signed)
Mike Mack DEPT Provider Note   CSN: [id number] Arrival date & time: 12/07/19  1946     History Chief Complaint  Patient presents with  . Urinary Retention    Mike Mack is a 84 y.o. male.  The history is provided by the patient and medical records. No language interpreter was used.   Mike Mack is a 84 y.o. male who presents to the Emergency Department complaining of difficulty urinating. He presents the emergency department complaining of difficulty urinating since he had a bladder surgery performed on June 14. He states that he is having a urinary urgency and frequency with small volume urine released every time. He denies any fevers, nausea, vomiting, abdominal pain. Symptoms are moderate and constant nature.    Past Medical History:  Diagnosis Date  . Anemia 05/13/2013  . Arthritis    "right leg" (07/25/2013)  . BPH (benign prostatic hyperplasia)   . Constipation 05/13/2013  . Exertional shortness of breath    "sometimes" (07/25/2013)  . History of kidney stones   . History of stomach ulcers   . Hypertension   . Neuropathy   . Polio   . Polio Childhood  . Small bowel obstruction ([place])   . Stroke [facility]) 2014   residual:  "left hand kind of numb" (07/25/2013)  . Type II diabetes mellitus [facility])     Patient Active Problem List   Diagnosis Date Noted  . Syncope 10/25/2014  . AKI (acute kidney injury) ([place]) 10/25/2014  . H/O: CVA (cerebrovascular accident) 10/25/2014  . H/O poliomyelitis 10/25/2014  . DM II (diabetes mellitus, type II), controlled ([place]) 10/25/2014  . Tachycardia 08/29/2013  . Cough 08/29/2013  . Monoparesis of leg ([place]) 08/20/2013  . Muscle weakness of lower extremity 08/20/2013  . Right upper quadrant abdominal abscess ([place]) 08/15/2013  . Hepatic abscess 08/15/2013  . Severe sepsis ([place]) 08/15/2013  . Subacute delirium 08/14/2013  . Hyperglycemia 08/14/2013  . Oral thrush 08/14/2013  . Leukocytosis,  unspecified 08/14/2013  . Debility 08/14/2013  . S/P cholecystectomy 07/29/2013  . Cholecystitis with cholelithiasis 07/25/2013  . Arthritis 06/06/2013  . Headache(784.0) 06/06/2013  . Headache 06/06/2013  . Generalized weakness 05/24/2013  . Cholecystitis, acute 05/24/2013  . RUQ abdominal pain 05/13/2013  . Acute cholecystitis: Probable 05/13/2013  . Constipation 05/13/2013  . Enterococcus UTI 05/13/2013  . Leukocytosis 05/13/2013  . Anemia 05/13/2013  . Stroke ([place]) 04/15/2012  . Acute left thalamic CVA (cerebral infarction) 04/10/2012  . Diabetes mellitus without complication ([place])   . Hypertension   . BPH (benign prostatic hyperplasia)   . Pruritic disorder 02/02/2011    Past Surgical History:  Procedure Laterality Date  . BOWEL RESECTION    . CATARACT EXTRACTION W/ INTRAOCULAR LENS  IMPLANT, BILATERAL Bilateral 2000's  . CHOLECYSTECTOMY N/A 07/25/2013   Procedure: LAPAROSCOPIC CHOLECYSTECTOMY WITH INTRAOPERATIVE CHOLANGIOGRAM;  Surgeon: [name], MD;  Location: [place];  Service: General;  Laterality: N/A;  . COLON SURGERY    . COLONOSCOPY     Hx: of  . CYSTOSCOPY WITH BIOPSY N/A 12/01/2019   Procedure: CYSTOSCOPY WITH BLADDER BIOPSY AND FULGURATION;  Surgeon: Lucas Mallow, MD;  Location[facility];  Service: Urology;  Laterality: N/A;  . EXCISIONAL HEMORRHOIDECTOMY  2000's  . EXTRACORPOREAL SHOCK WAVE LITHOTRIPSY Right 10/03/2018   Procedure: EXTRACORPOREAL SHOCK WAVE LITHOTRIPSY (ESWL);  Surgeon: Festus Aloe, MD;  Location[facility];  Service: Urology;  Laterality: Right;  . EYE SURGERY    . INGUINAL HERNIA REPAIR  Right 1970's  . LAPAROSCOPIC CHOLECYSTECTOMY  07/25/2013   w/LOA (07/25/2013)       Family History  Problem Relation Age of Onset  . Heart failure Mother   . Diabetes Sister   . Diabetes Sister   . Diabetes Brother   . Hypertension Sister   . Hypertension Sister   . Hypertension Brother   . Heart attack Sister     Social History    Tobacco Use  . Smoking status: Never Smoker  . Smokeless tobacco: Never Used  Vaping Use  . Vaping Use: Never used  Substance Use Topics  . Alcohol use: No  . Drug use: No    Home Medications Prior to Admission medications   Medication Sig Start Date End Date Taking? Authorizing Provider  aspirin EC 81 MG tablet Take 81 mg by mouth daily.    [provider]  atorvastatin (LIPITOR) 20 MG tablet Take 20 mg by mouth every evening.  11/03/13   [provider]  candesartan (ATACAND) 16 MG tablet Take 16 mg by mouth daily.  09/26/18   [provider]  cholecalciferol (VITAMIN D) 25 MCG (1000 UT) tablet Take 1,000 Units by mouth daily.    [provider]  COMBIGAN 0.2-0.5 % ophthalmic solution Place 1 drop into both eyes 2 (two) times daily. 09/21/14   [provider]  gabapentin (NEURONTIN) 800 MG tablet Take 800 mg by mouth 2 (two) times daily.  10/07/14   [provider]  glucose blood test strip  04/04/15   [provider]  HYDROcodone-acetaminophen (NORCO) 5-325 MG tablet Take 1 tablet by mouth every 4 (four) hours as needed for moderate pain. 12/01/19   Marton Redwood III, MD  insulin detemir (LEVEMIR) 100 UNIT/ML injection Inject 16 Units into the skin 2 (two) times daily.     [provider]  latanoprost (XALATAN) 0.005 % ophthalmic solution Place 1 drop into both eyes at bedtime.  11/17/14   [provider]  levothyroxine (SYNTHROID) 88 MCG tablet Take 88 mcg by mouth daily before breakfast.  04/21/11   [provider]  metoprolol tartrate (LOPRESSOR) 25 MG tablet Take 25 mg by mouth 2 (two) times daily.    [provider]  Multiple Vitamin (MULTIVITAMIN WITH MINERALS) TABS tablet Take 1 tablet by mouth daily. Patient not taking: Reported on 03/23/2019 09/06/13   [name], MD  ondansetron (ZOFRAN ODT) 4 MG disintegrating tablet 4mg  ODT q4 hours prn nausea/vomit Patient not taking:  Reported on 11/12/2019 08/24/19   Drenda Freeze, MD  pioglitazone (ACTOS) 15 MG tablet Take 7.5 mg by mouth daily.     [provider]  polyethylene glycol (MIRALAX / GLYCOLAX) packet Take 17 g by mouth daily. Patient not taking: Reported on 11/12/2019 09/06/13   [name], MD  Tamsulosin HCl (FLOMAX) 0.4 MG CAPS Take 0.4 mg by mouth 2 (two) times daily.     [provider]  Travoprost, BAK Free, (TRAVATAN) 0.004 % SOLN ophthalmic solution Place 1 drop into both eyes daily.     [provider]  vitamin B-12 (CYANOCOBALAMIN) 1000 MCG tablet Take 1,000 mcg by mouth daily.    [provider]    Allergies    Metformin and related, Other, and [place] [insulin glargine-lixisenatide]  Review of Systems   Review of Systems  All other systems reviewed and are negative.   Physical Exam Updated Vital Signs BP (!) 186/95   Pulse 66   Temp 98.6  F (37 C) (Oral)   Resp 19   Ht 5\' 5"  (1.651 m)   Wt 83.9 kg   SpO2 99%   BMI 30.79 kg/m   Physical Exam Vitals and nursing note reviewed.  Constitutional:      Appearance: He is well-developed.  HENT:     Head: Normocephalic and atraumatic.  Cardiovascular:     Rate and Rhythm: Normal rate and regular rhythm.     Heart sounds: No murmur heard.   Pulmonary:     Effort: Pulmonary effort is normal. No respiratory distress.     Breath sounds: Normal breath sounds.  Abdominal:     Palpations: Abdomen is soft.     Tenderness: There is no guarding or rebound.     Comments: Abdominal distention without any tenderness.  Musculoskeletal:        General: No tenderness.  Skin:    General: Skin is warm and dry.  Neurological:     Mental Status: He is alert and oriented to person, place, and time.  Psychiatric:        Behavior: Behavior normal.     ED Results / Procedures / Treatments   Labs (all labs ordered are listed, but only abnormal results are displayed) Labs Reviewed  URINALYSIS, ROUTINE  W REFLEX MICROSCOPIC - Abnormal; Notable for the following components:      Result Value   Glucose, UA 150 (*)    Hgb urine dipstick LARGE (*)    Protein, ur >=300 (*)    Leukocytes,Ua TRACE (*)    RBC / HPF >50 (*)    All other components within normal limits  BASIC METABOLIC PANEL - Abnormal; Notable for the following components:   Glucose, Bld 236 (*)    Creatinine, Ser 1.31 (*)    GFR calc non Af Amer 51 (*)    GFR calc Af Amer 59 (*)    All other components within normal limits  URINE CULTURE  CBC WITH DIFFERENTIAL/PLATELET    EKG None  Radiology No results found.  Procedures Procedures (including critical care time) EMERGENCY DEPARTMENT ULTRASOUND  Study: Limited Ultrasound of Bladder  INDICATIONS: to assess for urinary retention and/or bladder volume prior to urinary catheter Multiple views of the bladder were obtained in real-time in the transverse and longitudinal planes with a multi-frequency probe.  PERFORMED BY: Myself IMAGES ARCHIVED?: Yes LIMITATIONS:  Patient positioning INTERPRETATION: Moderate Volume       Medications Ordered in ED Medications - No data to display  ED Course  I have reviewed the triage vital signs and the nursing notes.  Pertinent labs & imaging results that were available during my care of the patient were reviewed by me and considered in my medical decision making (see chart for details).    MDM Rules/Calculators/A&P                         Patient here for evaluation of urinary frequency following cystoscopy one week ago. Bedside bladder ultrasound with only small amount of fluid in his bladder. BMP with normal renal function. Will check urinalysis and treat if it is consistent with UTI.  UA not c/w UTI.  Discussed with patient option to treat with medications for his sxs and he declines.  Plan to d/c home with Urology follow up, return precautions.    Final Clinical Impression(s) / ED Diagnoses Final diagnoses:  Dysuria      Rx / DC Orders ED Discharge Orders  None       Quintella Reichert, MD 12/08/19 Benancio Deeds

## 2019-12-08 LAB — URINALYSIS, ROUTINE W REFLEX MICROSCOPIC
Bacteria, UA: NONE SEEN
Bilirubin Urine: NEGATIVE
Glucose, UA: 150 mg/dL — AB
Ketones, ur: NEGATIVE mg/dL
Nitrite: NEGATIVE
Protein, ur: >=300 mg/dL — AB
RBC / HPF: >50 RBC/hpf — ABNORMAL HIGH (ref 0–5)
Specific Gravity, Urine: 1.009 (ref 1.005–1.030)
pH: 6 (ref 5.0–8.0)

## 2019-12-08 NOTE — Discharge Instructions (Addendum)
Please follow up with Urology as scheduled.  Get rechecked if you develop fevers, cannot urinate, or new concerning symptoms.

## 2019-12-09 LAB — URINE CULTURE: Culture: 10000 — AB

## 2019-12-10 DIAGNOSIS — D414 Neoplasm of uncertain behavior of bladder: Secondary | ICD-10-CM | POA: Diagnosis not present

## 2019-12-10 DIAGNOSIS — R972 Elevated prostate specific antigen [PSA]: Secondary | ICD-10-CM | POA: Diagnosis not present

## 2019-12-11 ENCOUNTER — Ambulatory Visit (INDEPENDENT_AMBULATORY_CARE_PROVIDER_SITE_OTHER): Payer: Medicare HMO | Admitting: Ophthalmology

## 2019-12-11 ENCOUNTER — Encounter (INDEPENDENT_AMBULATORY_CARE_PROVIDER_SITE_OTHER): Payer: Self-pay | Admitting: Ophthalmology

## 2019-12-11 ENCOUNTER — Other Ambulatory Visit: Payer: Self-pay

## 2019-12-11 DIAGNOSIS — Z794 Long term (current) use of insulin: Secondary | ICD-10-CM

## 2019-12-11 DIAGNOSIS — E113553 Type 2 diabetes mellitus with stable proliferative diabetic retinopathy, bilateral: Secondary | ICD-10-CM | POA: Diagnosis not present

## 2019-12-11 DIAGNOSIS — H43813 Vitreous degeneration, bilateral: Secondary | ICD-10-CM | POA: Diagnosis not present

## 2019-12-11 NOTE — Assessment & Plan Note (Signed)

## 2019-12-11 NOTE — Progress Notes (Signed)
12/11/2019     CHIEF COMPLAINT Patient presents for Retina Follow Up   HISTORY OF PRESENT ILLNESS: Mike Mack is a 84 y.o. male who presents to the clinic today for:   HPI    Retina Follow Up    Patient presents with  Diabetic Retinopathy.  In both eyes.  Duration of 10 months.  Since onset it is stable.          Comments    10 month follow up - OCT OU Patient denies change in vision and overall has no complaints. LBS 169 /// A1C 8       Last edited by Gerda Diss on 12/11/2019  2:50 PM. (History)      Referring physician: Jani Gravel, MD [address] [place],  [place] [address]  HISTORICAL INFORMATION:   Selected notes from the MEDICAL RECORD NUMBER    Lab Results  Component Value Date   HGBA1C 8.1 (H) 11/20/2019     CURRENT MEDICATIONS: Current Outpatient Medications (Ophthalmic Drugs)  Medication Sig   COMBIGAN 0.2-0.5 % ophthalmic solution Place 1 drop into both eyes 2 (two) times daily.   latanoprost (XALATAN) 0.005 % ophthalmic solution Place 1 drop into both eyes at bedtime.    Travoprost, BAK Free, (TRAVATAN) 0.004 % SOLN ophthalmic solution Place 1 drop into both eyes daily.    No current facility-administered medications for this visit. (Ophthalmic Drugs)   Current Outpatient Medications (Other)  Medication Sig   aspirin EC 81 MG tablet Take 81 mg by mouth daily.   atorvastatin (LIPITOR) 20 MG tablet Take 20 mg by mouth every evening.    candesartan (ATACAND) 16 MG tablet Take 16 mg by mouth daily.    cholecalciferol (VITAMIN D) 25 MCG (1000 UT) tablet Take 1,000 Units by mouth daily.   gabapentin (NEURONTIN) 800 MG tablet Take 800 mg by mouth 2 (two) times daily.    glucose blood test strip    HYDROcodone-acetaminophen (NORCO) 5-325 MG tablet Take 1 tablet by mouth every 4 (four) hours as needed for moderate pain.   insulin detemir (LEVEMIR) 100 UNIT/ML injection Inject 16 Units into the skin 2 (two) times daily.     levothyroxine (SYNTHROID) 88 MCG tablet Take 88 mcg by mouth daily before breakfast.    metoprolol tartrate (LOPRESSOR) 25 MG tablet Take 25 mg by mouth 2 (two) times daily.   Multiple Vitamin (MULTIVITAMIN WITH MINERALS) TABS tablet Take 1 tablet by mouth daily. (Patient not taking: Reported on 03/23/2019)   ondansetron (ZOFRAN ODT) 4 MG disintegrating tablet 4mg  ODT q4 hours prn nausea/vomit (Patient not taking: Reported on 11/12/2019)   pioglitazone (ACTOS) 15 MG tablet Take 7.5 mg by mouth daily.    polyethylene glycol (MIRALAX / GLYCOLAX) packet Take 17 g by mouth daily. (Patient not taking: Reported on 11/12/2019)   Tamsulosin HCl (FLOMAX) 0.4 MG CAPS Take 0.4 mg by mouth 2 (two) times daily.    vitamin B-12 (CYANOCOBALAMIN) 1000 MCG tablet Take 1,000 mcg by mouth daily.   No current facility-administered medications for this visit. (Other)      REVIEW OF SYSTEMS:    ALLERGIES Allergies  Allergen Reactions   Metformin And Related Other (See Comments)    Gi intolerance   Other     Other reaction(s): Other (See Comments) Renal insufficiency Other reaction(s): GI Upset (intolerance) Gi intolerance   [name] [Insulin Glargine-Lixisenatide] Other (See Comments)    Stomach cramps    PAST MEDICAL HISTORY Past Medical History:  Diagnosis Date   Anemia 05/13/2013   Arthritis    "right leg" (07/25/2013)   BPH (benign prostatic hyperplasia)    Constipation 05/13/2013   Exertional shortness of breath    "sometimes" (07/25/2013)   History of kidney stones    History of stomach ulcers    Hypertension    Neuropathy    Polio    Polio Childhood   Small bowel obstruction ([place])    Stroke ([place]) 2014   residual:  "left hand kind of numb" (07/25/2013)   Type II diabetes mellitus ([place])    Past Surgical History:  Procedure Laterality Date   BOWEL RESECTION     CATARACT EXTRACTION W/ INTRAOCULAR LENS  IMPLANT, BILATERAL Bilateral 2000's   CHOLECYSTECTOMY N/A  07/25/2013   Procedure: LAPAROSCOPIC CHOLECYSTECTOMY WITH INTRAOPERATIVE CHOLANGIOGRAM;  Surgeon: [name], MD;  Location: [place];  Service: General;  Laterality: N/A;   COLON SURGERY     COLONOSCOPY     Hx: of   CYSTOSCOPY WITH BIOPSY N/A 12/01/2019   Procedure: CYSTOSCOPY WITH BLADDER BIOPSY AND FULGURATION;  Surgeon: Lucas Mallow, MD;  [facility];  Service: Urology;  Laterality: N/A;   EXCISIONAL HEMORRHOIDECTOMY  2000's   EXTRACORPOREAL SHOCK WAVE LITHOTRIPSY Right 10/03/2018   Procedure: EXTRACORPOREAL SHOCK WAVE LITHOTRIPSY (ESWL);  Surgeon: Festus Aloe, MD;  [facility];  Service: Urology;  Laterality: Right;   EYE SURGERY     INGUINAL HERNIA REPAIR Right 1970's   LAPAROSCOPIC CHOLECYSTECTOMY  07/25/2013   w/LOA (07/25/2013)    FAMILY HISTORY Family History  Problem Relation Age of Onset   Heart failure Mother    Diabetes Sister    Diabetes Sister    Diabetes Brother    Hypertension Sister    Hypertension Sister    Hypertension Brother    Heart attack Sister     SOCIAL HISTORY Social History   Tobacco Use   Smoking status: Never Smoker   Smokeless tobacco: Never Used  Scientific laboratory technician Use: Never used  Substance Use Topics   Alcohol use: No   Drug use: No         OPHTHALMIC EXAM: Base Eye Exam    Visual Acuity (Snellen - Linear)      Right Left   Dist cc 20/80-1 20/40-2   Dist ph cc NI NI       Tonometry (Tonopen, 2:56 PM)      Right Left   Pressure 19 22       Pupils      Pupils Dark Light Shape React APD   Right PERRL 4 3 Round Slow None   Left PERRL 4 3 Round Slow None       Visual Fields (Counting fingers)   Poor cooperation and understanding       Extraocular Movement   Poor cooperation and understanding       Neuro/Psych    Oriented x3: Yes   Mood/Affect: Normal       Dilation    Both eyes: 1.0% Mydriacyl, 2.5% Phenylephrine @ 2:56 PM        Slit Lamp and Fundus Exam     External Exam      Right Left   External Normal Normal       Slit Lamp Exam      Right Left   Lids/Lashes Normal Normal   Conjunctiva/Sclera White and quiet White and quiet   Cornea Clear Clear   Anterior Chamber Deep and quiet  Deep and quiet   Iris Round and reactive Round and reactive   Lens Posterior chamber intraocular lens Posterior chamber intraocular lens   Anterior Vitreous Normal Normal       Fundus Exam      Right Left   Posterior Vitreous Posterior vitreous detachment Posterior vitreous detachment   Disc Normal Normal   C/D Ratio 0.3 0.3   Macula no clinically significant macular edema no clinically significant macular edema   Vessels PDR-quiet PDR-quiet   Periphery good prp, nasal prp good prp, nasal prp          IMAGING AND PROCEDURES  Imaging and Procedures for 12/11/19  OCT, Retina - OU - Both Eyes       Right Eye Quality was good. Scan locations included subfoveal. Central Foveal Thickness: 218. Progression has been stable. Findings include central retinal atrophy, outer retinal atrophy.   Left Eye Quality was good. Scan locations included subfoveal. Central Foveal Thickness: 225. Progression has been stable. Findings include central retinal atrophy, outer retinal atrophy.                 ASSESSMENT/PLAN:  No problem-specific Assessment & Plan notes found for this encounter.      ICD-10-CM   1. Controlled type 2 diabetes mellitus with stable proliferative retinopathy of both eyes, with long-term current use of insulin (HCC)  E11.3553 OCT, Retina - OU - Both Eyes   Z79.4   2. Posterior vitreous detachment of both eyes  H43.813 OCT, Retina - OU - Both Eyes    1.  2.  3.  Ophthalmic Meds Ordered this visit:  No orders of the defined types were placed in this encounter.      Return in about 9 months (around 09/09/2020).  There are no Patient Instructions on file for this visit.   Explained the diagnoses, plan, and follow up with  the patient and they expressed understanding.  Patient expressed understanding of the importance of proper follow up care.   Clent Demark Kennard Fildes M.D. Diseases & Surgery of the Retina and Vitreous Retina & Diabetic [place] 12/11/19     Abbreviations: M myopia (nearsighted); A astigmatism; H hyperopia (farsighted); P presbyopia; Mrx spectacle prescription;  CTL contact lenses; OD right eye; OS left eye; OU both eyes  XT exotropia; ET esotropia; PEK punctate epithelial keratitis; PEE punctate epithelial erosions; DES dry eye syndrome; MGD meibomian gland dysfunction; ATs artificial tears; PFAT's preservative free artificial tears; [place] nuclear sclerotic cataract; PSC posterior subcapsular cataract; ERM epi-retinal membrane; PVD posterior vitreous detachment; RD retinal detachment; DM diabetes mellitus; DR diabetic retinopathy; NPDR non-proliferative diabetic retinopathy; PDR proliferative diabetic retinopathy; CSME clinically significant macular edema; DME diabetic macular edema; dbh dot blot hemorrhages; CWS cotton wool spot; POAG primary open angle glaucoma; C/D cup-to-disc ratio; HVF humphrey visual field; GVF goldmann visual field; OCT optical coherence tomography; IOP intraocular pressure; BRVO Branch retinal vein occlusion; CRVO central retinal vein occlusion; CRAO central retinal artery occlusion; BRAO branch retinal artery occlusion; RT retinal tear; SB scleral buckle; PPV pars plana vitrectomy; VH Vitreous hemorrhage; PRP panretinal laser photocoagulation; IVK intravitreal kenalog; VMT vitreomacular traction; MH Macular hole;  NVD neovascularization of the disc; NVE neovascularization elsewhere; AREDS age related eye disease study; ARMD age related macular degeneration; POAG primary open angle glaucoma; EBMD epithelial/anterior basement membrane dystrophy; ACIOL anterior chamber intraocular lens; IOL intraocular lens; PCIOL posterior chamber intraocular lens; Phaco/IOL phacoemulsification with  intraocular lens placement; PRK photorefractive keratectomy; LASIK laser assisted in situ keratomileusis; HTN  hypertension; DM diabetes mellitus; COPD chronic obstructive pulmonary disease

## 2019-12-12 DIAGNOSIS — L602 Onychogryphosis: Secondary | ICD-10-CM | POA: Diagnosis not present

## 2019-12-12 DIAGNOSIS — L84 Corns and callosities: Secondary | ICD-10-CM | POA: Diagnosis not present

## 2019-12-18 DIAGNOSIS — E11319 Type 2 diabetes mellitus with unspecified diabetic retinopathy without macular edema: Secondary | ICD-10-CM | POA: Diagnosis not present

## 2019-12-18 DIAGNOSIS — E039 Hypothyroidism, unspecified: Secondary | ICD-10-CM | POA: Diagnosis not present

## 2019-12-18 DIAGNOSIS — E1142 Type 2 diabetes mellitus with diabetic polyneuropathy: Secondary | ICD-10-CM | POA: Diagnosis not present

## 2019-12-18 DIAGNOSIS — I10 Essential (primary) hypertension: Secondary | ICD-10-CM | POA: Diagnosis not present

## 2019-12-18 DIAGNOSIS — E1121 Type 2 diabetes mellitus with diabetic nephropathy: Secondary | ICD-10-CM | POA: Diagnosis not present

## 2019-12-18 DIAGNOSIS — E118 Type 2 diabetes mellitus with unspecified complications: Secondary | ICD-10-CM | POA: Diagnosis not present

## 2019-12-18 DIAGNOSIS — I739 Peripheral vascular disease, unspecified: Secondary | ICD-10-CM | POA: Diagnosis not present

## 2019-12-18 DIAGNOSIS — E1169 Type 2 diabetes mellitus with other specified complication: Secondary | ICD-10-CM | POA: Diagnosis not present

## 2019-12-18 DIAGNOSIS — I639 Cerebral infarction, unspecified: Secondary | ICD-10-CM | POA: Diagnosis not present

## 2019-12-19 DIAGNOSIS — I739 Peripheral vascular disease, unspecified: Secondary | ICD-10-CM | POA: Diagnosis not present

## 2019-12-19 DIAGNOSIS — E1351 Other specified diabetes mellitus with diabetic peripheral angiopathy without gangrene: Secondary | ICD-10-CM | POA: Diagnosis not present

## 2020-01-29 DIAGNOSIS — E113593 Type 2 diabetes mellitus with proliferative diabetic retinopathy without macular edema, bilateral: Secondary | ICD-10-CM | POA: Diagnosis not present

## 2020-01-29 DIAGNOSIS — H401133 Primary open-angle glaucoma, bilateral, severe stage: Secondary | ICD-10-CM | POA: Diagnosis not present

## 2020-01-29 DIAGNOSIS — H462 Nutritional optic neuropathy: Secondary | ICD-10-CM | POA: Diagnosis not present

## 2020-02-05 ENCOUNTER — Emergency Department (HOSPITAL_COMMUNITY)
Admission: EM | Admit: 2020-02-05 | Discharge: 2020-02-05 | Disposition: A | Discharge status: Home / Self Care | Payer: Medicare HMO | Attending: Emergency Medicine | Admitting: Emergency Medicine

## 2020-02-05 ENCOUNTER — Other Ambulatory Visit: Payer: Self-pay

## 2020-02-05 ENCOUNTER — Encounter (HOSPITAL_COMMUNITY): Payer: Self-pay

## 2020-02-05 DIAGNOSIS — Z5321 Procedure and treatment not carried out due to patient leaving prior to being seen by health care provider: Secondary | ICD-10-CM | POA: Insufficient documentation

## 2020-02-05 DIAGNOSIS — R1031 Right lower quadrant pain: Secondary | ICD-10-CM | POA: Diagnosis present

## 2020-02-05 LAB — COMPREHENSIVE METABOLIC PANEL
ALT: 15 U/L (ref 0–44)
AST: 19 U/L (ref 15–41)
Albumin: 3.4 g/dL — ABNORMAL LOW (ref 3.5–5.0)
Alkaline Phosphatase: 112 U/L (ref 38–126)
Anion gap: 7 (ref 5–15)
BUN: 16 mg/dL (ref 8–23)
CO2: 27 mmol/L (ref 22–32)
Calcium: 9.2 mg/dL (ref 8.9–10.3)
Chloride: 104 mmol/L (ref 98–111)
Creatinine, Ser: 1.37 mg/dL — ABNORMAL HIGH (ref 0.61–1.24)
GFR calc Af Amer: 56 mL/min — ABNORMAL LOW (ref >60)
GFR calc non Af Amer: 48 mL/min — ABNORMAL LOW (ref >60)
Glucose, Bld: 142 mg/dL — ABNORMAL HIGH (ref 70–99)
Potassium: 4.1 mmol/L (ref 3.5–5.1)
Sodium: 138 mmol/L (ref 135–145)
Total Bilirubin: 0.6 mg/dL (ref 0.3–1.2)
Total Protein: 7.7 g/dL (ref 6.5–8.1)

## 2020-02-05 LAB — LIPASE, BLOOD: Lipase: 23 U/L (ref 11–51)

## 2020-02-05 LAB — CBC
HCT: 43.7 % (ref 39.0–52.0)
Hemoglobin: 14.2 g/dL (ref 13.0–17.0)
MCH: 31.7 pg (ref 26.0–34.0)
MCHC: 32.5 g/dL (ref 30.0–36.0)
MCV: 97.5 fL (ref 80.0–100.0)
Platelets: 264 10*3/uL (ref 150–400)
RBC: 4.48 MIL/uL (ref 4.22–5.81)
RDW: 12.5 % (ref 11.5–15.5)
WBC: 7.7 10*3/uL (ref 4.0–10.5)
nRBC: 0 % (ref 0.0–0.2)

## 2020-02-05 NOTE — ED Triage Notes (Signed)
Pt reports RLQ abdominal pain that started 2 days ago. Also reports having hard bowel movements and urinary hesitancy. Last urinated and had a BM yesterday. Denies N/V.

## 2020-02-05 NOTE — ED Notes (Signed)
Pt given specimen cup and made aware of need for urine specimen

## 2020-02-06 DIAGNOSIS — N3000 Acute cystitis without hematuria: Secondary | ICD-10-CM | POA: Diagnosis not present

## 2020-03-04 DIAGNOSIS — H462 Nutritional optic neuropathy: Secondary | ICD-10-CM | POA: Diagnosis not present

## 2020-03-04 DIAGNOSIS — H401133 Primary open-angle glaucoma, bilateral, severe stage: Secondary | ICD-10-CM | POA: Diagnosis not present

## 2020-03-18 DIAGNOSIS — D892 Hypergammaglobulinemia, unspecified: Secondary | ICD-10-CM | POA: Diagnosis not present

## 2020-03-18 DIAGNOSIS — Z23 Encounter for immunization: Secondary | ICD-10-CM | POA: Diagnosis not present

## 2020-03-18 DIAGNOSIS — I10 Essential (primary) hypertension: Secondary | ICD-10-CM | POA: Diagnosis not present

## 2020-03-18 DIAGNOSIS — Z794 Long term (current) use of insulin: Secondary | ICD-10-CM | POA: Diagnosis not present

## 2020-03-18 DIAGNOSIS — E039 Hypothyroidism, unspecified: Secondary | ICD-10-CM | POA: Diagnosis not present

## 2020-03-18 DIAGNOSIS — E782 Mixed hyperlipidemia: Secondary | ICD-10-CM | POA: Diagnosis not present

## 2020-03-18 DIAGNOSIS — E1121 Type 2 diabetes mellitus with diabetic nephropathy: Secondary | ICD-10-CM | POA: Diagnosis not present

## 2020-03-19 ENCOUNTER — Inpatient Hospital Stay (HOSPITAL_COMMUNITY)
Admission: EM | Admit: 2020-03-19 | Discharge: 2020-03-25 | DRG: 371 | Disposition: A | Discharge status: Skilled Nursing Facility | Payer: Medicare HMO | Attending: Internal Medicine | Admitting: Internal Medicine

## 2020-03-19 ENCOUNTER — Emergency Department (HOSPITAL_COMMUNITY): Payer: Medicare HMO

## 2020-03-19 ENCOUNTER — Other Ambulatory Visit: Payer: Self-pay

## 2020-03-19 ENCOUNTER — Encounter (HOSPITAL_COMMUNITY): Payer: Self-pay | Admitting: Emergency Medicine

## 2020-03-19 DIAGNOSIS — R5381 Other malaise: Secondary | ICD-10-CM | POA: Diagnosis not present

## 2020-03-19 DIAGNOSIS — Z8249 Family history of ischemic heart disease and other diseases of the circulatory system: Secondary | ICD-10-CM

## 2020-03-19 DIAGNOSIS — R1011 Right upper quadrant pain: Secondary | ICD-10-CM | POA: Diagnosis present

## 2020-03-19 DIAGNOSIS — E119 Type 2 diabetes mellitus without complications: Secondary | ICD-10-CM

## 2020-03-19 DIAGNOSIS — J9811 Atelectasis: Secondary | ICD-10-CM | POA: Diagnosis present

## 2020-03-19 DIAGNOSIS — Z8673 Personal history of transient ischemic attack (TIA), and cerebral infarction without residual deficits: Secondary | ICD-10-CM

## 2020-03-19 DIAGNOSIS — E871 Hypo-osmolality and hyponatremia: Secondary | ICD-10-CM | POA: Diagnosis present

## 2020-03-19 DIAGNOSIS — E872 Acidosis: Secondary | ICD-10-CM | POA: Diagnosis present

## 2020-03-19 DIAGNOSIS — I129 Hypertensive chronic kidney disease with stage 1 through stage 4 chronic kidney disease, or unspecified chronic kidney disease: Secondary | ICD-10-CM | POA: Diagnosis present

## 2020-03-19 DIAGNOSIS — E1140 Type 2 diabetes mellitus with diabetic neuropathy, unspecified: Secondary | ICD-10-CM | POA: Diagnosis present

## 2020-03-19 DIAGNOSIS — Z20822 Contact with and (suspected) exposure to covid-19: Secondary | ICD-10-CM | POA: Diagnosis present

## 2020-03-19 DIAGNOSIS — G9341 Metabolic encephalopathy: Secondary | ICD-10-CM | POA: Diagnosis present

## 2020-03-19 DIAGNOSIS — Z79890 Hormone replacement therapy (postmenopausal): Secondary | ICD-10-CM | POA: Diagnosis not present

## 2020-03-19 DIAGNOSIS — R531 Weakness: Secondary | ICD-10-CM

## 2020-03-19 DIAGNOSIS — R627 Adult failure to thrive: Secondary | ICD-10-CM | POA: Diagnosis not present

## 2020-03-19 DIAGNOSIS — R935 Abnormal findings on diagnostic imaging of other abdominal regions, including retroperitoneum: Secondary | ICD-10-CM | POA: Diagnosis not present

## 2020-03-19 DIAGNOSIS — M6259 Muscle wasting and atrophy, not elsewhere classified, multiple sites: Secondary | ICD-10-CM | POA: Diagnosis not present

## 2020-03-19 DIAGNOSIS — J90 Pleural effusion, not elsewhere classified: Secondary | ICD-10-CM | POA: Diagnosis present

## 2020-03-19 DIAGNOSIS — R410 Disorientation, unspecified: Secondary | ICD-10-CM

## 2020-03-19 DIAGNOSIS — Z7401 Bed confinement status: Secondary | ICD-10-CM | POA: Diagnosis not present

## 2020-03-19 DIAGNOSIS — Z888 Allergy status to other drugs, medicaments and biological substances status: Secondary | ICD-10-CM | POA: Diagnosis not present

## 2020-03-19 DIAGNOSIS — Z9841 Cataract extraction status, right eye: Secondary | ICD-10-CM

## 2020-03-19 DIAGNOSIS — E876 Hypokalemia: Secondary | ICD-10-CM | POA: Diagnosis not present

## 2020-03-19 DIAGNOSIS — N400 Enlarged prostate without lower urinary tract symptoms: Secondary | ICD-10-CM | POA: Diagnosis present

## 2020-03-19 DIAGNOSIS — F0390 Unspecified dementia without behavioral disturbance: Secondary | ICD-10-CM | POA: Diagnosis present

## 2020-03-19 DIAGNOSIS — N183 Chronic kidney disease, stage 3 unspecified: Secondary | ICD-10-CM | POA: Diagnosis not present

## 2020-03-19 DIAGNOSIS — Z87442 Personal history of urinary calculi: Secondary | ICD-10-CM

## 2020-03-19 DIAGNOSIS — Z79899 Other long term (current) drug therapy: Secondary | ICD-10-CM

## 2020-03-19 DIAGNOSIS — R6251 Failure to thrive (child): Secondary | ICD-10-CM | POA: Diagnosis present

## 2020-03-19 DIAGNOSIS — E113553 Type 2 diabetes mellitus with stable proliferative diabetic retinopathy, bilateral: Secondary | ICD-10-CM | POA: Diagnosis present

## 2020-03-19 DIAGNOSIS — E1122 Type 2 diabetes mellitus with diabetic chronic kidney disease: Secondary | ICD-10-CM | POA: Diagnosis present

## 2020-03-19 DIAGNOSIS — L0291 Cutaneous abscess, unspecified: Secondary | ICD-10-CM | POA: Diagnosis present

## 2020-03-19 DIAGNOSIS — Z833 Family history of diabetes mellitus: Secondary | ICD-10-CM

## 2020-03-19 DIAGNOSIS — N151 Renal and perinephric abscess: Secondary | ICD-10-CM | POA: Diagnosis not present

## 2020-03-19 DIAGNOSIS — Z7982 Long term (current) use of aspirin: Secondary | ICD-10-CM

## 2020-03-19 DIAGNOSIS — I959 Hypotension, unspecified: Secondary | ICD-10-CM | POA: Diagnosis not present

## 2020-03-19 DIAGNOSIS — L89616 Pressure-induced deep tissue damage of right heel: Secondary | ICD-10-CM | POA: Diagnosis present

## 2020-03-19 DIAGNOSIS — Z794 Long term (current) use of insulin: Secondary | ICD-10-CM

## 2020-03-19 DIAGNOSIS — R1084 Generalized abdominal pain: Secondary | ICD-10-CM | POA: Diagnosis not present

## 2020-03-19 DIAGNOSIS — M6281 Muscle weakness (generalized): Secondary | ICD-10-CM | POA: Diagnosis not present

## 2020-03-19 DIAGNOSIS — I6782 Cerebral ischemia: Secondary | ICD-10-CM | POA: Diagnosis not present

## 2020-03-19 DIAGNOSIS — R109 Unspecified abdominal pain: Secondary | ICD-10-CM | POA: Diagnosis not present

## 2020-03-19 DIAGNOSIS — R4182 Altered mental status, unspecified: Secondary | ICD-10-CM | POA: Diagnosis not present

## 2020-03-19 DIAGNOSIS — Z8711 Personal history of peptic ulcer disease: Secondary | ICD-10-CM

## 2020-03-19 DIAGNOSIS — I10 Essential (primary) hypertension: Secondary | ICD-10-CM | POA: Diagnosis not present

## 2020-03-19 DIAGNOSIS — Z9842 Cataract extraction status, left eye: Secondary | ICD-10-CM

## 2020-03-19 DIAGNOSIS — D72829 Elevated white blood cell count, unspecified: Secondary | ICD-10-CM | POA: Diagnosis present

## 2020-03-19 DIAGNOSIS — I639 Cerebral infarction, unspecified: Secondary | ICD-10-CM | POA: Diagnosis not present

## 2020-03-19 DIAGNOSIS — G14 Postpolio syndrome: Secondary | ICD-10-CM | POA: Diagnosis present

## 2020-03-19 DIAGNOSIS — E039 Hypothyroidism, unspecified: Secondary | ICD-10-CM | POA: Diagnosis not present

## 2020-03-19 DIAGNOSIS — N1831 Chronic kidney disease, stage 3a: Secondary | ICD-10-CM | POA: Diagnosis present

## 2020-03-19 DIAGNOSIS — K651 Peritoneal abscess: Principal | ICD-10-CM | POA: Diagnosis present

## 2020-03-19 DIAGNOSIS — E1165 Type 2 diabetes mellitus with hyperglycemia: Secondary | ICD-10-CM | POA: Diagnosis present

## 2020-03-19 DIAGNOSIS — M2550 Pain in unspecified joint: Secondary | ICD-10-CM | POA: Diagnosis not present

## 2020-03-19 DIAGNOSIS — Z978 Presence of other specified devices: Secondary | ICD-10-CM | POA: Diagnosis not present

## 2020-03-19 DIAGNOSIS — G9389 Other specified disorders of brain: Secondary | ICD-10-CM | POA: Diagnosis not present

## 2020-03-19 DIAGNOSIS — B964 Proteus (mirabilis) (morganii) as the cause of diseases classified elsewhere: Secondary | ICD-10-CM | POA: Diagnosis not present

## 2020-03-19 DIAGNOSIS — Z961 Presence of intraocular lens: Secondary | ICD-10-CM | POA: Diagnosis present

## 2020-03-19 LAB — CBC WITH DIFFERENTIAL/PLATELET
Abs Immature Granulocytes: 0.05 10*3/uL (ref 0.00–0.07)
Basophils Absolute: 0 10*3/uL (ref 0.0–0.1)
Basophils Relative: 0 %
Eosinophils Absolute: 0 10*3/uL (ref 0.0–0.5)
Eosinophils Relative: 0 %
HCT: 38.8 % — ABNORMAL LOW (ref 39.0–52.0)
Hemoglobin: 13.1 g/dL (ref 13.0–17.0)
Immature Granulocytes: 1 %
Lymphocytes Relative: 15 %
Lymphs Abs: 1.5 10*3/uL (ref 0.7–4.0)
MCH: 31.2 pg (ref 26.0–34.0)
MCHC: 33.8 g/dL (ref 30.0–36.0)
MCV: 92.4 fL (ref 80.0–100.0)
Monocytes Absolute: 0.7 10*3/uL (ref 0.1–1.0)
Monocytes Relative: 8 %
Neutro Abs: 7.5 10*3/uL (ref 1.7–7.7)
Neutrophils Relative %: 76 %
Platelets: 417 10*3/uL — ABNORMAL HIGH (ref 150–400)
RBC: 4.2 MIL/uL — ABNORMAL LOW (ref 4.22–5.81)
RDW: 13.2 % (ref 11.5–15.5)
WBC: 9.8 10*3/uL (ref 4.0–10.5)
nRBC: 0 % (ref 0.0–0.2)

## 2020-03-19 LAB — URINALYSIS, ROUTINE W REFLEX MICROSCOPIC
Bilirubin Urine: NEGATIVE
Glucose, UA: NEGATIVE mg/dL
Ketones, ur: 5 mg/dL — AB
Nitrite: NEGATIVE
Protein, ur: 100 mg/dL — AB
RBC / HPF: >50 RBC/hpf — ABNORMAL HIGH (ref 0–5)
Specific Gravity, Urine: 1.013 (ref 1.005–1.030)
pH: 7 (ref 5.0–8.0)

## 2020-03-19 LAB — PROTIME-INR
INR: 1.3 — ABNORMAL HIGH (ref 0.8–1.2)
Prothrombin Time: 15.7 seconds — ABNORMAL HIGH (ref 11.4–15.2)

## 2020-03-19 LAB — COMPREHENSIVE METABOLIC PANEL
ALT: 13 U/L (ref 0–44)
AST: 13 U/L — ABNORMAL LOW (ref 15–41)
Albumin: 2.7 g/dL — ABNORMAL LOW (ref 3.5–5.0)
Alkaline Phosphatase: 88 U/L (ref 38–126)
Anion gap: 11 (ref 5–15)
BUN: 12 mg/dL (ref 8–23)
CO2: 24 mmol/L (ref 22–32)
Calcium: 8.6 mg/dL — ABNORMAL LOW (ref 8.9–10.3)
Chloride: 103 mmol/L (ref 98–111)
Creatinine, Ser: 1.35 mg/dL — ABNORMAL HIGH (ref 0.61–1.24)
GFR calc Af Amer: 57 mL/min — ABNORMAL LOW (ref >60)
GFR calc non Af Amer: 49 mL/min — ABNORMAL LOW (ref >60)
Glucose, Bld: 111 mg/dL — ABNORMAL HIGH (ref 70–99)
Potassium: 4.4 mmol/L (ref 3.5–5.1)
Sodium: 138 mmol/L (ref 135–145)
Total Bilirubin: 0.8 mg/dL (ref 0.3–1.2)
Total Protein: 7.6 g/dL (ref 6.5–8.1)

## 2020-03-19 LAB — RESPIRATORY PANEL BY RT PCR (FLU A&B, COVID)
Influenza A by PCR: NEGATIVE
Influenza B by PCR: NEGATIVE
SARS Coronavirus 2 by RT PCR: NEGATIVE

## 2020-03-19 LAB — LIPASE, BLOOD: Lipase: 20 U/L (ref 11–51)

## 2020-03-19 LAB — TROPONIN I (HIGH SENSITIVITY)
Troponin I (High Sensitivity): 6 ng/L (ref <18)
Troponin I (High Sensitivity): 7 ng/L (ref <18)

## 2020-03-19 LAB — LACTIC ACID, PLASMA: Lactic Acid, Venous: 1.2 mmol/L (ref 0.5–1.9)

## 2020-03-19 LAB — HEMOGLOBIN A1C
Hgb A1c MFr Bld: 8.4 % — ABNORMAL HIGH (ref 4.8–5.6)
Mean Plasma Glucose: 194.38 mg/dL

## 2020-03-19 LAB — GLUCOSE, CAPILLARY: Glucose-Capillary: 107 mg/dL — ABNORMAL HIGH (ref 70–99)

## 2020-03-19 LAB — CBG MONITORING, ED: Glucose-Capillary: 89 mg/dL (ref 70–99)

## 2020-03-19 MED ORDER — NETARSUDIL DIMESYLATE 0.02 % OP SOLN
1.0000 [drp] | Freq: Every day | OPHTHALMIC | Status: DC
  Filled 2020-03-19: qty 2.5

## 2020-03-19 MED ORDER — SODIUM CHLORIDE (PF) 0.9 % IJ SOLN
INTRAMUSCULAR | Status: AC
  Filled 2020-03-19: qty 50

## 2020-03-19 MED ORDER — SODIUM CHLORIDE 0.9 % IV SOLN: 3.0000 g | Freq: Four times a day (QID) | INTRAVENOUS | Status: DC

## 2020-03-19 MED ORDER — LACTATED RINGERS IV SOLN: INTRAVENOUS | Status: DC

## 2020-03-19 MED ORDER — BRIMONIDINE TARTRATE 0.2 % OP SOLN
1.0000 [drp] | Freq: Two times a day (BID) | OPHTHALMIC | Status: DC
  Administered 2020-03-19 – 2020-03-25 (×12): 1 [drp] via OPHTHALMIC
  Filled 2020-03-19: qty 5

## 2020-03-19 MED ORDER — IOHEXOL 300 MG/ML  SOLN
75.0000 mL | Freq: Once | INTRAMUSCULAR | Status: AC | PRN
  Administered 2020-03-19: 75 mL via INTRAVENOUS

## 2020-03-19 MED ORDER — DORZOLAMIDE HCL-TIMOLOL MAL 2-0.5 % OP SOLN
1.0000 [drp] | Freq: Two times a day (BID) | OPHTHALMIC | Status: DC
  Administered 2020-03-19 – 2020-03-25 (×12): 1 [drp] via OPHTHALMIC
  Filled 2020-03-19: qty 10

## 2020-03-19 MED ORDER — SODIUM CHLORIDE 0.9 % IV SOLN
2.0000 g | Freq: Every day | INTRAVENOUS | Status: DC
  Administered 2020-03-19 – 2020-03-22 (×4): 2 g via INTRAVENOUS
  Filled 2020-03-19 (×4): qty 20
  Filled 2020-03-19: qty 2

## 2020-03-19 MED ORDER — INSULIN ASPART 100 UNIT/ML ~~LOC~~ SOLN
0.0000 [IU] | SUBCUTANEOUS | Status: DC
  Administered 2020-03-20 – 2020-03-21 (×2): 2 [IU] via SUBCUTANEOUS
  Administered 2020-03-21: 3 [IU] via SUBCUTANEOUS
  Administered 2020-03-21: 5 [IU] via SUBCUTANEOUS
  Administered 2020-03-22: 3 [IU] via SUBCUTANEOUS
  Administered 2020-03-22: 8 [IU] via SUBCUTANEOUS
  Administered 2020-03-22: 3 [IU] via SUBCUTANEOUS
  Administered 2020-03-22: 2 [IU] via SUBCUTANEOUS
  Administered 2020-03-22: 5 [IU] via SUBCUTANEOUS
  Administered 2020-03-23: 3 [IU] via SUBCUTANEOUS
  Administered 2020-03-23 (×2): 5 [IU] via SUBCUTANEOUS
  Administered 2020-03-23: 3 [IU] via SUBCUTANEOUS
  Administered 2020-03-23: 2 [IU] via SUBCUTANEOUS
  Administered 2020-03-24 (×3): 3 [IU] via SUBCUTANEOUS
  Administered 2020-03-24 – 2020-03-25 (×3): 2 [IU] via SUBCUTANEOUS

## 2020-03-19 MED ORDER — INSULIN DETEMIR 100 UNIT/ML ~~LOC~~ SOLN: 6.0000 [IU] | Freq: Two times a day (BID) | SUBCUTANEOUS | Status: DC

## 2020-03-19 MED ORDER — METOPROLOL TARTRATE 25 MG PO TABS
25.0000 mg | ORAL_TABLET | Freq: Two times a day (BID) | ORAL | Status: DC
  Administered 2020-03-19 – 2020-03-21 (×5): 25 mg via ORAL
  Filled 2020-03-19 (×5): qty 1

## 2020-03-19 MED ORDER — METRONIDAZOLE IN NACL 5-0.79 MG/ML-% IV SOLN
500.0000 mg | Freq: Three times a day (TID) | INTRAVENOUS | Status: DC
  Administered 2020-03-19 – 2020-03-24 (×14): 500 mg via INTRAVENOUS
  Filled 2020-03-19 (×14): qty 100

## 2020-03-19 MED ORDER — LATANOPROST 0.005 % OP SOLN
1.0000 [drp] | Freq: Every day | OPHTHALMIC | Status: DC
  Administered 2020-03-19 – 2020-03-24 (×6): 1 [drp] via OPHTHALMIC
  Filled 2020-03-19: qty 2.5

## 2020-03-19 NOTE — ED Notes (Signed)
MD approved. Patient provided with sandwich at this time.

## 2020-03-19 NOTE — Plan of Care (Signed)

## 2020-03-19 NOTE — ED Triage Notes (Signed)
Pt arrived via EMS from home. Pt has hx of diabetes. Pt's CBG was 90 at home. Pt was confused and altered from his baseline. Pt has hx of memory issues, but per his wife he is more altered than normal. Pt was initially alert to person. Pt has had loose bowel movements for the past few days and poor appetite. Pt has also had a cough for 2 weeks. Pt has been vaccinated for covid.

## 2020-03-19 NOTE — Consult Note (Signed)
Mike Mack [birth year]  [id number].    Requesting MD: Dr. Quintella Reichert Chief Complaint/Reason for Consult: subphrenic abscess  HPI:  This is a very sweet 84 yo black male with a history of HTN, DM, CVA on ASA, nephrolithiasis, polio, and neuropathy, with some "forgetfulness" per his wife at the bedside.  All history is obtained from the wife today due to patient's confusion.  He has had a prior ex lap with SBR for SBO as well as a cholecystectomy.  For the last couple of weeks, the patient has developed an intermittent cough along with a decrease in his appetite.  Normally, he has a good appetite and drinks well.  He has not done either of those well in the last couple of weeks.  She denies any fevers.  The last several days, his stools have been more soft and loose than normal.  She said he hasn't complained of any abdominal pain to her.  His last colonoscopy was many years ago per his wife, but done by [facility].  He apparently woke up this morning "talking gibberish."  She thought maybe his sugar was low and called 911.  He was brought here for further work up.  CT of his head shows no acute findings.  His WBC is normal and he is afebrile.  He complains of a very minimal amount of some epigastric abdominal pain.  A CT scan was therefore ordered which revealed a right anterior subphrenic collection measuring 13x3cm most c/w an abscess.  No clear etiology but question a colonic source given close positioning to hepatic flexure.  We have been asked to see the patient for further recommendations.  ROS: ROS: Please see HPI, otherwise patient is having a difficult time with his memory secondary to AMS and full ROS is unable to be obtained.  Family History  Problem Relation Age of Onset  . Heart failure Mother   . Diabetes Sister   . Diabetes Sister   . Diabetes Brother   . Hypertension Sister   . Hypertension Sister   . Hypertension Brother   . Heart attack Sister     Past  Medical History:  Diagnosis Date  . Anemia 05/13/2013  . Arthritis    "right leg" (07/25/2013)  . BPH (benign prostatic hyperplasia)   . Constipation 05/13/2013  . Exertional shortness of breath    "sometimes" (07/25/2013)  . History of kidney stones   . History of stomach ulcers   . Hypertension   . Neuropathy   . Polio   . Polio Childhood  . Small bowel obstruction ([place])   . Stroke [facility]) 2014   residual:  "left hand kind of numb" (07/25/2013)  . Type II diabetes mellitus ([place])     Past Surgical History:  Procedure Laterality Date  . BOWEL RESECTION    . CATARACT EXTRACTION W/ INTRAOCULAR LENS  IMPLANT, BILATERAL Bilateral 2000's  . CHOLECYSTECTOMY N/A 07/25/2013   Procedure: LAPAROSCOPIC CHOLECYSTECTOMY WITH INTRAOPERATIVE CHOLANGIOGRAM;  Surgeon: [name], MD;  Location: [facility];  Service: General;  Laterality: N/A;  . COLON SURGERY    . COLONOSCOPY     Hx: of  . CYSTOSCOPY WITH BIOPSY N/A 12/01/2019   Procedure: CYSTOSCOPY WITH BLADDER BIOPSY AND FULGURATION;  Surgeon: Lucas Mallow, MD;  Location: [facility];  Service: Urology;  Laterality: N/A;  . EXCISIONAL HEMORRHOIDECTOMY  2000's  . EXTRACORPOREAL SHOCK WAVE LITHOTRIPSY Right 10/03/2018   Procedure: EXTRACORPOREAL SHOCK WAVE LITHOTRIPSY (ESWL);  Surgeon:  Festus Aloe, MD;  Location: [facility];  Service: Urology;  Laterality: Right;  . EYE SURGERY    . INGUINAL HERNIA REPAIR Right 1970's  . LAPAROSCOPIC CHOLECYSTECTOMY  07/25/2013   w/LOA (07/25/2013)    Social History:  reports that he has never smoked. He has never used smokeless tobacco. He reports that he does not drink alcohol and does not use drugs.  Allergies:  Allergies  Allergen Reactions  . Metformin And Related Other (See Comments)    Gi intolerance  . Other     Other reaction(s): Other (See Comments) Renal insufficiency Other reaction(s): GI Upset (intolerance) Gi intolerance  . [name] [Insulin Glargine-Lixisenatide] Other (See Comments)     Stomach cramps    (Not in a hospital admission)    Physical Exam: Blood pressure (!) 158/70, pulse 79, temperature 98.6 F (37 C), temperature source Oral, resp. rate 16, height 5\' 5"  (1.651 m), weight 80.3 kg, SpO2 100 %. General: pleasant, WD, WN black male who is laying in bed in NAD HEENT: head is normocephalic, atraumatic.  Sclera are noninjected.  PERRL.  Ears without any masses or lesions.  Mask covering mouth and nose. Heart: regular, rate, and rhythm.  Normal s1,s2. No obvious murmurs, gallops, or rubs noted.  Palpable radial and pedal pulses bilaterally Lungs: CTAB, no wheezes, rhonchi, or rales noted.  Respiratory effort nonlabored Abd: soft, very minimal epigastric tenderness, ND, +BS, no masses, hernias, or organomegaly.  Midline scar from prior laparotomy MS: all 4 extremities are symmetrical with no cyanosis, clubbing, or edema, except his RUE and RLE are more atrophied from polio and his CVA. Skin: warm and dry with no masses, lesions, or rashes Neuro: Cranial nerves 2-12 grossly intact, sensation is normal throughout Psych: Alert and knows he is at the hospital.  States [facility].  Unable to give me the date or the president   Results for orders placed or performed during the hospital encounter of 03/19/20 (from the past 48 hour(s))  Comprehensive metabolic panel     Status: Abnormal   Collection Time: 03/19/20  6:41 AM  Result Value Ref Range   Sodium 138 135 - 145 mmol/L   Potassium 4.4 3.5 - 5.1 mmol/L   Chloride 103 98 - 111 mmol/L   CO2 24 22 - 32 mmol/L   Glucose, Bld 111 (H) 70 - 99 mg/dL    Comment: Glucose reference range applies only to samples taken after fasting for at least 8 hours.   BUN 12 8 - 23 mg/dL   Creatinine, Ser 1.35 (H) 0.61 - 1.24 mg/dL   Calcium 8.6 (L) 8.9 - 10.3 mg/dL   Total Protein 7.6 6.5 - 8.1 g/dL   Albumin 2.7 (L) 3.5 - 5.0 g/dL   AST 13 (L) 15 - 41 U/L   ALT 13 0 - 44 U/L   Alkaline Phosphatase 88 38 - 126 U/L   Total Bilirubin 0.8  0.3 - 1.2 mg/dL   GFR calc non Af Amer 49 (L) >60 mL/min   GFR calc Af Amer 57 (L) >60 mL/min   Anion gap 11 5 - 15    Comment: Performed at [facility], [place] [address] [place], [place] [address]  CBC with Differential     Status: Abnormal   Collection Time: 03/19/20  6:44 AM  Result Value Ref Range   WBC 9.8 4.0 - 10.5 K/uL   RBC 4.20 (L) 4.22 - 5.81 MIL/uL   Hemoglobin 13.1 13.0 - 17.0 g/dL  HCT 38.8 (L) 39 - 52 %   MCV 92.4 80.0 - 100.0 fL   MCH 31.2 26.0 - 34.0 pg   MCHC 33.8 30.0 - 36.0 g/dL   RDW 13.2 11.5 - 15.5 %   Platelets 417 (H) 150 - 400 K/uL   nRBC 0.0 0.0 - 0.2 %   Neutrophils Relative % 76 %   Neutro Abs 7.5 1.7 - 7.7 K/uL   Lymphocytes Relative 15 %   Lymphs Abs 1.5 0.7 - 4.0 K/uL   Monocytes Relative 8 %   Monocytes Absolute 0.7 0 - 1 K/uL   Eosinophils Relative 0 %   Eosinophils Absolute 0.0 0 - 0 K/uL   Basophils Relative 0 %   Basophils Absolute 0.0 0 - 0 K/uL   Immature Granulocytes 1 %   Abs Immature Granulocytes 0.05 0.00 - 0.07 K/uL    Comment: Performed at [facility], [place] [address] [place], [place] [address]  POC CBG, ED     Status: None   Collection Time: 03/19/20  6:46 AM  Result Value Ref Range   Glucose-Capillary 89 70 - 99 mg/dL    Comment: Glucose reference range applies only to samples taken after fasting for at least 8 hours.  Lipase, blood     Status: None   Collection Time: 03/19/20  7:15 AM  Result Value Ref Range   Lipase 20 11 - 51 U/L    Comment: Performed at [facility], [place] [address] [place], [place] [address]  Troponin I (High Sensitivity)     Status: None   Collection Time: 03/19/20  7:15 AM  Result Value Ref Range   Troponin I (High Sensitivity) 6 <18 ng/L    Comment: (NOTE) Elevated high sensitivity troponin I (hsTnI) values and significant  changes across serial measurements may suggest ACS but many other  chronic and acute conditions are known to elevate  hsTnI results.  Refer to the "Links" section for chest pain algorithms and additional  guidance. Performed at [facility], [place] [address] [place], [place] [address]   Respiratory Panel by RT PCR (Flu A&B, Covid) - Nasopharyngeal Swab     Status: None   Collection Time: 03/19/20  7:15 AM   Specimen: Nasopharyngeal Swab  Result Value Ref Range   SARS Coronavirus 2 by RT PCR NEGATIVE NEGATIVE    Comment: (NOTE) SARS-CoV-2 target nucleic acids are NOT DETECTED.  The SARS-CoV-2 RNA is generally detectable in upper respiratoy specimens during the acute phase of infection. The lowest concentration of SARS-CoV-2 viral copies this assay can detect is 131 copies/mL. A negative result does not preclude SARS-Cov-2 infection and should not be used as the sole basis for treatment or other patient management decisions. A negative result may occur with  improper specimen collection/handling, submission of specimen other than nasopharyngeal swab, presence of viral mutation(s) within the areas targeted by this assay, and inadequate number of viral copies (<131 copies/mL). A negative result must be combined with clinical observations, patient history, and epidemiological information. The expected result is Negative.  Fact Sheet for Patients:  PinkCheek.be  Fact Sheet for Healthcare Providers:  GravelBags.it  This test is no t yet approved or cleared by the [place] FDA and  has been authorized for detection and/or diagnosis of SARS-CoV-2 by FDA under an Emergency Use Authorization (EUA). This EUA will remain  in effect (meaning this test can be used) for the duration of the COVID-19 declaration under Section 564(b)(1)  of the Act, 21 U.S.C. section 360bbb-3(b)(1), unless the authorization is terminated or revoked sooner.     Influenza A by PCR NEGATIVE NEGATIVE   Influenza B by PCR NEGATIVE NEGATIVE    Comment:  (NOTE) The Xpert Xpress SARS-CoV-2/FLU/RSV assay is intended as an aid in  the diagnosis of influenza from Nasopharyngeal swab specimens and  should not be used as a sole basis for treatment. Nasal washings and  aspirates are unacceptable for Xpert Xpress SARS-CoV-2/FLU/RSV  testing.  Fact Sheet for Patients: PinkCheek.be  Fact Sheet for Healthcare Providers: GravelBags.it  This test is not yet approved or cleared by the [place] FDA and  has been authorized for detection and/or diagnosis of SARS-CoV-2 by  FDA under an Emergency Use Authorization (EUA). This EUA will remain  in effect (meaning this test can be used) for the duration of the  Covid-19 declaration under Section 564(b)(1) of the Act, 21  U.S.C. section 360bbb-3(b)(1), unless the authorization is  terminated or revoked. Performed at [facility], [place] [address] [place], [place] [address]   Urinalysis, Routine w reflex microscopic Urine, Clean Catch     Status: Abnormal   Collection Time: 03/19/20  9:33 AM  Result Value Ref Range   Color, Urine YELLOW YELLOW   APPearance CLEAR CLEAR   Specific Gravity, Urine 1.013 1.005 - 1.030   pH 7.0 5.0 - 8.0   Glucose, UA NEGATIVE NEGATIVE mg/dL   Hgb urine dipstick LARGE (A) NEGATIVE   Bilirubin Urine NEGATIVE NEGATIVE   Ketones, ur 5 (A) NEGATIVE mg/dL   Protein, ur 100 (A) NEGATIVE mg/dL   Nitrite NEGATIVE NEGATIVE   Leukocytes,Ua SMALL (A) NEGATIVE   RBC / HPF >50 (H) 0 - 5 RBC/hpf   WBC, UA 21-50 0 - 5 WBC/hpf   Bacteria, UA RARE (A) NONE SEEN   Squamous Epithelial / LPF 0-5 0 - 5   Mucus PRESENT     Comment: Performed at [facility], [place] [address], [place], [place] [address]  Troponin I (High Sensitivity)     Status: None   Collection Time: 03/19/20 10:42 AM  Result Value Ref Range   Troponin I (High Sensitivity) 7 <18 ng/L    Comment: (NOTE) Elevated high  sensitivity troponin I (hsTnI) values and significant  changes across serial measurements may suggest ACS but many other  chronic and acute conditions are known to elevate hsTnI results.  Refer to the "Links" section for chest pain algorithms and additional  guidance. Performed at [facility], [place] [address] [place], [place] [address]   Lactic acid, plasma     Status: None   Collection Time: 03/19/20 11:45 AM  Result Value Ref Range   Lactic Acid, Venous 1.2 0.5 - 1.9 mmol/L    Comment: Performed at [facility], [place] [address], [place], [place] [address]   CT Head Wo Contrast  Result Date: 03/19/2020 CLINICAL DATA:  Mental status change EXAM: CT HEAD WITHOUT CONTRAST TECHNIQUE: Contiguous axial images were obtained from the base of the skull through the vertex without intravenous contrast. COMPARISON:  10/25/2014 head CT and prior. FINDINGS: Brain: No acute infarct or intracranial hemorrhage. No mass lesion. No midline shift or extra-axial fluid collection. Panventricular dilatation is unchanged. Scattered and confluent hypodense foci involving the periventricular and subcortical white matter nonspecific however commonly associated with chronic microvascular ischemic changes. Sequela of more remote bilateral cerebellar insults. Vascular: No hyperdense vessel or unexpected calcification. Bilateral skull base  atherosclerotic calcifications. Skull: Negative for fracture or focal lesion. Sinuses/Orbits: Sequela of bilateral lens replacement. Clear paranasal sinuses. No mastoid effusion. Other: None. IMPRESSION: No acute intracranial process. Remote bilateral cerebellar infarcts. Panventricular dilatation, unchanged. Consider normal pressure hydrocephalus. Chronic microvascular ischemic changes are unchanged. Electronically Signed   By: Primitivo Gauze M.D.   On: 03/19/2020 09:25   CT Abdomen Pelvis W Contrast  Result Date: 03/19/2020 CLINICAL DATA:  Acute  nonlocalized abdominal pain EXAM: CT ABDOMEN AND PELVIS WITH CONTRAST TECHNIQUE: Multidetector CT imaging of the abdomen and pelvis was performed using the standard protocol following bolus administration of intravenous contrast. CONTRAST:  68mL OMNIPAQUE IOHEXOL 300 MG/ML  SOLN COMPARISON:  08/24/2019 FINDINGS: Lower chest: Small right more than left pleural effusion and lower lobe atelectasis. Coronary atherosclerosis. Hepatobiliary: No focal liver abnormality.Cholecystectomy. No bile duct dilatation. Pancreas: Unremarkable. Spleen: Unremarkable. Adrenals/Urinary Tract: Negative adrenals. No hydronephrosis or ureteral stone. Punctate right lower pole calculi. Bilateral renal cystic densities which are unchanged. Bladder wall thickening which is circumferential and likely from chronic outlet obstruction. Near calcific density along the mucosal surface anteriorly and inferiorly, also seen previously. Stomach/Bowel: No obstruction. No detected bowel wall thickening, no appendicitis. A small track like soft tissue density structure extends superiorly from the splenic flexure towards the subphrenic collection. Vascular/Lymphatic: No acute vascular abnormality. No mass or adenopathy. Reproductive:Symmetric enlargement of the prostate. Other: Rim enhancing fluid collection in the anterior subphrenic space, contiguous with the right liver. Collection measures 13 cm in length by 3 cm in thickness. Postoperative right groin, possibly for hernia repair. Fatty umbilical hernia. Musculoskeletal: No acute abnormalities. Asymmetric muscular atrophy involving the right hip. IMPRESSION: 1. Right anterior subphrenic collection measuring up to 13 x 3 cm-most likely an abscess. No clear underlying cause but a colonic source is favored given close positioning of the splenic flexure with small track like structure projecting superiorly towards the collection. No active colon inflammation is seen. 2. Small right more than left pleural  effusion, likely sympathetic. 3. Chronic findings are described above. Electronically Signed   By: Monte Fantasia M.D.   On: 03/19/2020 11:14   DG Chest Port 1 View  Result Date: 03/19/2020 CLINICAL DATA:  Cough EXAM: PORTABLE CHEST 1 VIEW COMPARISON:  Oct 25, 2014. FINDINGS: There are pleural effusions, larger on the right than on the left, with bibasilar atelectasis. The lungs elsewhere are clear. The heart size and pulmonary vascularity are normal. No adenopathy. Note that there is an azygos lobe on the right, an anatomic variant. No bone lesions. IMPRESSION: Small pleural effusions bilaterally, larger on the right than on the left, with bibasilar atelectasis. Lungs elsewhere clear. Heart size normal. Electronically Signed   By: Lowella Grip III M.D.   On: 03/19/2020 07:54      Assessment/Plan HTN - per medicine H/O nephrolithiasis - likely explains UA findings H/o CVA - on ASA DM - per medicine H/O polio  Right subphrenic abscess Unclear etiology of this abscess although CT scan questions his colon given proximity.  He has not had a colonoscopy for many years.  It is likely his changes in diet and bowels are secondary to this fluid collection in his abdomen.  He also has a small right pleural effusion which is likely sympathetic from this RUQ collection and may be the etiology of his cough for the last couple of weeks.  The current plan would be to ask IR to see if this collection is drainable.  If so, then drain placement with abx  therapy.  Once procedure is complete, he may eat from our standpoint.  Pending findings from drain, patient may warrant and outpatient colonoscopy for further evaluate his colon moving forward.  We will follow, but no plans for any surgical intervention at this point.   Henreitta Cea, [facility] 03/19/2020, 1:29 PM Please see Amion for pager number during day hours 7:00am-4:30pm or 7:00am -11:30am on weekends

## 2020-03-19 NOTE — Progress Notes (Signed)
Referring Physician(s): [place]  Supervising Physician: Jacqulynn Cadet  Patient Status:  [facility] ED  Chief Complaint: Cough, abdominal pain, lethargy   Subjective: Patient familiar to IR service from percutaneous cholecystostomy in November 2014 with drain injection in January 2015 revealing malpositioning and failed attempt to replace.  Patient underwent laparoscopic cholecystectomy on 07/25/2013.  He has had prior gallbladder fossa abscess drain, mid epigastric /perigastric abscess drain as well as hepatic abscess drain placements in 2015.  He now presents to [facility]  ED with cough, epigastric discomfort and lethargy.  CT head was negative for any acute changes but noted remote bilateral cerebellar infarcts.  Chest x-ray showed small pleural effusions bilaterally .  CT abdomen pelvis revealed:  1. Right anterior subphrenic collection measuring up to 13 x 3 cm-most likely an abscess. No clear underlying cause but a colonic source is favored given close positioning of the splenic flexure with small track like structure projecting superiorly towards the collection. No active colon inflammation is seen. 2. Small right more than left pleural effusion, likely sympathetic.   Patient currently afebrile, WBC 9.8, hemoglobin 13.1, platelets 417k, creatinine 1.35, PT/INR pending, COVID-19 negative, blood/urine cultures pending.  Most of history obtained from wife -patient denies headache, chest pain, worsening dyspnea, back pain, nausea, vomiting or bleeding.  Request now received for image guided drainage of right subphrenic fluid collection.  Additional medical history as below.   Past Medical History:  Diagnosis Date   Anemia 05/13/2013   Arthritis    "right leg" (07/25/2013)   BPH (benign prostatic hyperplasia)    Constipation 05/13/2013   Exertional shortness of breath    "sometimes" (07/25/2013)   History of kidney stones    History of stomach ulcers    Hypertension     Neuropathy    Polio    Polio Childhood   Small bowel obstruction ([employer])    Stroke ([place]) 2014   residual:  "left hand kind of numb" (07/25/2013)   Type II diabetes mellitus ([place])    Past Surgical History:  Procedure Laterality Date   BOWEL RESECTION     CATARACT EXTRACTION W/ INTRAOCULAR LENS  IMPLANT, BILATERAL Bilateral 2000's   CHOLECYSTECTOMY N/A 07/25/2013   Procedure: LAPAROSCOPIC CHOLECYSTECTOMY WITH INTRAOPERATIVE CHOLANGIOGRAM;  Surgeon: [name], MD;  Location: [place];  Service: General;  Laterality: N/A;   COLON SURGERY     COLONOSCOPY     Hx: of   CYSTOSCOPY WITH BIOPSY N/A 12/01/2019   Procedure: CYSTOSCOPY WITH BLADDER BIOPSY AND FULGURATION;  Surgeon: Lucas Mallow, MD;  Location: [facility];  Service: Urology;  Laterality: N/A;   EXCISIONAL HEMORRHOIDECTOMY  2000's   EXTRACORPOREAL SHOCK WAVE LITHOTRIPSY Right 10/03/2018   Procedure: EXTRACORPOREAL SHOCK WAVE LITHOTRIPSY (ESWL);  Surgeon: Festus Aloe, MD;  Location: [facility];  Service: Urology;  Laterality: Right;   EYE SURGERY     INGUINAL HERNIA REPAIR Right 1970's   LAPAROSCOPIC CHOLECYSTECTOMY  07/25/2013   w/LOA (07/25/2013)      Allergies: Metformin and related, Other, and Soliqua [insulin glargine-lixisenatide]  Medications: Prior to Admission medications   Medication Sig Start Date End Date Taking? Authorizing Provider  aspirin EC 81 MG tablet Take 81 mg by mouth daily.   Yes [provider]  atorvastatin (LIPITOR) 20 MG tablet Take 20 mg by mouth every evening.  11/03/13  Yes [provider]  B Complex Vitamins (B COMPLEX PO) Take 1 tablet by mouth daily.   Yes [provider]  brimonidine (  ALPHAGAN) 0.2 % ophthalmic solution Place 1 drop into both eyes 2 (two) times daily. 12/28/19  Yes [provider]  cholecalciferol (VITAMIN D-400) 10 MCG (400 UNIT) TABS tablet Take 400 Units by mouth daily.   Yes [provider]  dorzolamide-timolol  (COSOPT) 22.3-6.8 MG/ML ophthalmic solution Place 1 drop into both eyes 2 (two) times daily. 03/04/20  Yes [provider]  gabapentin (NEURONTIN) 600 MG tablet Take 600 mg by mouth 2 (two) times daily. 01/20/20  Yes [provider]  insulin detemir (LEVEMIR) 100 UNIT/ML injection Inject 16 Units into the skin 2 (two) times daily.    Yes [provider]  latanoprost (XALATAN) 0.005 % ophthalmic solution Place 1 drop into both eyes at bedtime.  11/17/14  Yes [provider]  levothyroxine (SYNTHROID) 88 MCG tablet Take 88 mcg by mouth at bedtime.  04/21/11  Yes [provider]  metoprolol tartrate (LOPRESSOR) 25 MG tablet Take 25 mg by mouth 2 (two) times daily.   Yes [provider]  olmesartan (BENICAR) 20 MG tablet Take 20 mg by mouth daily. 02/20/20  Yes [provider]  pioglitazone (ACTOS) 30 MG tablet Take 15 mg by mouth daily. 03/04/20  Yes [provider]  RHOPRESSA 0.02 % SOLN Place 1 drop into both eyes at bedtime. 03/05/20  Yes [provider]  Tamsulosin HCl (FLOMAX) 0.4 MG CAPS Take 0.4 mg by mouth 2 (two) times daily.    Yes [provider]  glucose blood test strip  04/04/15   [provider]  HYDROcodone-acetaminophen (NORCO) 5-325 MG tablet Take 1 tablet by mouth every 4 (four) hours as needed for moderate pain. Patient not taking: Reported on 03/19/2020 12/01/19   Lucas Mallow, MD  Multiple Vitamin (MULTIVITAMIN WITH MINERALS) TABS tablet Take 1 tablet by mouth daily. Patient not taking: Reported on 03/23/2019 09/06/13   [name], MD  ondansetron (ZOFRAN ODT) 4 MG disintegrating tablet 4mg  ODT q4 hours prn nausea/vomit Patient not taking: Reported on 11/12/2019 08/24/19   Drenda Freeze, MD  polyethylene glycol [facility] / Floria Raveling) packet Take 17 g by mouth daily. Patient not taking: Reported on 11/12/2019 09/06/13   [name], MD     Vital Signs: BP 129/71    Pulse  85    Temp 98.6 F (37 C) (Oral)    Resp 16    Ht 5\' 5"  (1.651 m)    Wt 177 lb (80.3 kg)    SpO2 (!) 85%    BMI 29.45 kg/m   Physical Exam patient drowsy but arousable; minimal interaction;  Chest with diminished breath sounds bases.  Heart with regular rate and rhythm.  Abdomen soft, positive bowel sounds, tender RUQ/epigastric region to palpation.  No lower extremity edema.  Imaging: CT Head Wo Contrast  Result Date: 03/19/2020 CLINICAL DATA:  Mental status change EXAM: CT HEAD WITHOUT CONTRAST TECHNIQUE: Contiguous axial images were obtained from the base of the skull through the vertex without intravenous contrast. COMPARISON:  10/25/2014 head CT and prior. FINDINGS: Brain: No acute infarct or intracranial hemorrhage. No mass lesion. No midline shift or extra-axial fluid collection. Panventricular dilatation is unchanged. Scattered and confluent hypodense foci involving the periventricular and subcortical white matter nonspecific however commonly associated with chronic microvascular ischemic changes. Sequela of more remote bilateral cerebellar insults. Vascular: No hyperdense vessel or unexpected calcification. Bilateral skull base atherosclerotic calcifications. Skull: Negative for fracture or focal lesion. Sinuses/Orbits: Sequela of bilateral lens replacement. Clear paranasal sinuses.  No mastoid effusion. Other: None. IMPRESSION: No acute intracranial process. Remote bilateral cerebellar infarcts. Panventricular dilatation, unchanged. Consider normal pressure hydrocephalus. Chronic microvascular ischemic changes are unchanged. Electronically Signed   By: Primitivo Gauze M.D.   On: 03/19/2020 09:25   CT Abdomen Pelvis W Contrast  Result Date: 03/19/2020 CLINICAL DATA:  Acute nonlocalized abdominal pain EXAM: CT ABDOMEN AND PELVIS WITH CONTRAST TECHNIQUE: Multidetector CT imaging of the abdomen and pelvis was performed using the standard protocol following bolus administration of intravenous  contrast. CONTRAST:  7mL OMNIPAQUE IOHEXOL 300 MG/ML  SOLN COMPARISON:  08/24/2019 FINDINGS: Lower chest: Small right more than left pleural effusion and lower lobe atelectasis. Coronary atherosclerosis. Hepatobiliary: No focal liver abnormality.Cholecystectomy. No bile duct dilatation. Pancreas: Unremarkable. Spleen: Unremarkable. Adrenals/Urinary Tract: Negative adrenals. No hydronephrosis or ureteral stone. Punctate right lower pole calculi. Bilateral renal cystic densities which are unchanged. Bladder wall thickening which is circumferential and likely from chronic outlet obstruction. Near calcific density along the mucosal surface anteriorly and inferiorly, also seen previously. Stomach/Bowel: No obstruction. No detected bowel wall thickening, no appendicitis. A small track like soft tissue density structure extends superiorly from the splenic flexure towards the subphrenic collection. Vascular/Lymphatic: No acute vascular abnormality. No mass or adenopathy. Reproductive:Symmetric enlargement of the prostate. Other: Rim enhancing fluid collection in the anterior subphrenic space, contiguous with the right liver. Collection measures 13 cm in length by 3 cm in thickness. Postoperative right groin, possibly for hernia repair. Fatty umbilical hernia. Musculoskeletal: No acute abnormalities. Asymmetric muscular atrophy involving the right hip. IMPRESSION: 1. Right anterior subphrenic collection measuring up to 13 x 3 cm-most likely an abscess. No clear underlying cause but a colonic source is favored given close positioning of the splenic flexure with small track like structure projecting superiorly towards the collection. No active colon inflammation is seen. 2. Small right more than left pleural effusion, likely sympathetic. 3. Chronic findings are described above. Electronically Signed   By: Monte Fantasia M.D.   On: 03/19/2020 11:14   DG Chest Port 1 View  Result Date: 03/19/2020 CLINICAL DATA:  Cough  EXAM: PORTABLE CHEST 1 VIEW COMPARISON:  Oct 25, 2014. FINDINGS: There are pleural effusions, larger on the right than on the left, with bibasilar atelectasis. The lungs elsewhere are clear. The heart size and pulmonary vascularity are normal. No adenopathy. Note that there is an azygos lobe on the right, an anatomic variant. No bone lesions. IMPRESSION: Small pleural effusions bilaterally, larger on the right than on the left, with bibasilar atelectasis. Lungs elsewhere clear. Heart size normal. Electronically Signed   By: Lowella Grip III M.D.   On: 03/19/2020 07:54    Labs:  CBC: Recent Labs    11/20/19 1616 12/07/19 2145 02/05/20 0421 03/19/20 0644  WBC 6.2 8.4 7.7 9.8  HGB 14.9 16.3 14.2 13.1  HCT 46.3 49.7 43.7 38.8*  PLT 179 227 264 417*    COAGS: No results for input(s): INR, APTT in the last 8760 hours.  BMP: Recent Labs    11/20/19 1616 12/07/19 2145 02/05/20 0421 03/19/20 0641  NA 136 137 138 138  K 4.9 4.2 4.1 4.4  CL 103 102 104 103  CO2 26 26 27 24   GLUCOSE 268* 236* 142* 111*  BUN 17 19 16 12   CALCIUM 8.6* 9.3 9.2 8.6*  CREATININE 1.35* 1.31* 1.37* 1.35*  GFRNONAA 49* 51* 48* 49*  GFRAA 57* 59* 56* 57*    LIVER FUNCTION TESTS: Recent Labs    08/24/19 1655 02/05/20  0421 03/19/20 0641  BILITOT 1.1 0.6 0.8  AST 23 19 13*  ALT 24 15 13   ALKPHOS 118 112 88  PROT 7.7 7.7 7.6  ALBUMIN 3.8 3.4* 2.7*    Assessment and Plan: 84 yo male with hx diabetes, CVA 2014, prior small bowel obstruction with resection, polio, hypertension, nephrolithiasis, BPH, arthritis, anemia and percutaneous cholecystostomy in November 2014 with drain injection in January 2015 revealing malpositioning and failed attempt to replace.  Patient underwent laparoscopic cholecystectomy on 07/25/2013.  He has had prior gallbladder fossa abscess drain, mid epigastric /perigastric abscess drain as well as hepatic abscess drain placements in 2015.  He now presents to [facility]  ED with  cough, epigastric discomfort and lethargy.  CT head was negative for any acute changes but noted remote bilateral cerebellar infarcts.  Chest x-ray showed small pleural effusions bilaterally . CT abdomen pelvis today revealed:  1. Right anterior subphrenic collection measuring up to 13 x 3 cm-most likely an abscess. No clear underlying cause but a colonic source is favored given close positioning of the splenic flexure with small track like structure projecting superiorly towards the collection. No active colon inflammation is seen. 2. Small right more than left pleural effusion, likely sympathetic.   Patient currently afebrile, WBC 9.8, hemoglobin 13.1, platelets 417k, creatinine 1.35, PT/INR pending, COVID-19 negative, blood/urine cultures pending.  Most of history obtained from wife -patient denies headache, chest pain, worsening dyspnea, back pain, nausea, vomiting or bleeding.  Request now received for image guided drainage of right subphrenic fluid collection.  Imaging studies were reviewed by Dr. Laurence Ferrari.Risks and benefits discussed with the patient/spouse including bleeding, infection, damage to adjacent structures, bowel perforation/fistula connection, and sepsis.  All of the patient's questions were answered, patient is agreeable to proceed. Consent signed and in chart.  Procedure scheduled for today   Electronically Signed: D. Rowe Robert, PA-C 03/19/2020, 2:32 PM   I spent a total of 30 minutes at the the patient's bedside AND on the patient's hospital floor or unit, greater than 50% of which was counseling/coordinating care for CT-guided right subphrenic fluid collection drainage    Patient ID: Mike Mack, male   DOB: Apr 07, 1936, 84 y.o.   MRN: [id number]

## 2020-03-19 NOTE — ED Notes (Signed)
Did not collect second lactic

## 2020-03-19 NOTE — Progress Notes (Signed)
Patient ID: Mike Mack, male   DOB: January 11, 1936, 84 y.o.   MRN: [id number] Due to heavy IR workload this afternoon pt's subphrenic drain placement will be postponed until 10/2 per Dr. Laurence Ferrari. Nurse made aware. Pt made NPO after MN; hold any anticoagulation until after procedure.

## 2020-03-19 NOTE — ED Provider Notes (Signed)
[facility] DEPT Provider Note   CSN: [id number] Arrival date & time: 03/19/20  0608     History Chief Complaint  Patient presents with  . Altered Mental Status    Mike Mack is a 84 y.o. male.  The history is provided by the patient, the spouse and medical records. No language interpreter was used.  Altered Mental Status  Mike Mack is a 84 y.o. male who presents to the Emergency Department complaining of altered mental status. Level V caveat due to confusion. History is provided by the patient's wife. She states that over the last several days he has had mild cough, decreased appetite and reporting abdominal discomfort. He received his flu vaccine yesterday. Around 3 AM he awoke from sleep and was speaking incoherently. She states that he has a history of memory problems but has never behaved in this way previously. He has a history of diabetes. No fevers, vomiting, diarrhea, dysuria. He has been fully vaccinated for COVID-19. No known sick contacts. He has a history of prior CVA and polio with chronic right lower extremity weakness.    Past Medical History:  Diagnosis Date  . Anemia 05/13/2013  . Arthritis    "right leg" (07/25/2013)  . BPH (benign prostatic hyperplasia)   . Constipation 05/13/2013  . Exertional shortness of breath    "sometimes" (07/25/2013)  . History of kidney stones   . History of stomach ulcers   . Hypertension   . Neuropathy   . Polio   . Polio Childhood  . Small bowel obstruction ([place])   . Stroke [facility]) 2014   residual:  "left hand kind of numb" (07/25/2013)  . Type II diabetes mellitus [facility])     Patient Active Problem List   Diagnosis Date Noted  . Subphrenic abscess ([place]) 03/19/2020  . Posterior vitreous detachment of both eyes 12/11/2019  . Syncope 10/25/2014  . AKI (acute kidney injury) ([place]) 10/25/2014  . H/O: CVA (cerebrovascular accident) 10/25/2014  . H/O poliomyelitis 10/25/2014  . Controlled type 2  diabetes mellitus with stable proliferative retinopathy of both eyes, with long-term current use of insulin ([place]) 10/25/2014  . Tachycardia 08/29/2013  . Cough 08/29/2013  . Monoparesis of leg ([place]) 08/20/2013  . Muscle weakness of lower extremity 08/20/2013  . Right upper quadrant abdominal abscess ([place]) 08/15/2013  . Hepatic abscess 08/15/2013  . Severe sepsis ([place]) 08/15/2013  . Subacute delirium 08/14/2013  . Hyperglycemia 08/14/2013  . Oral thrush 08/14/2013  . Leukocytosis, unspecified 08/14/2013  . Debility 08/14/2013  . S/P cholecystectomy 07/29/2013  . Cholecystitis with cholelithiasis 07/25/2013  . Arthritis 06/06/2013  . Headache(784.0) 06/06/2013  . Headache 06/06/2013  . Generalized weakness 05/24/2013  . Cholecystitis, acute 05/24/2013  . RUQ abdominal pain 05/13/2013  . Acute cholecystitis: Probable 05/13/2013  . Constipation 05/13/2013  . Enterococcus UTI 05/13/2013  . Leukocytosis 05/13/2013  . Anemia 05/13/2013  . Stroke ([place]) 04/15/2012  . Acute left thalamic CVA (cerebral infarction) 04/10/2012  . Hypertension   . BPH (benign prostatic hyperplasia)   . Pruritic disorder 02/02/2011    Past Surgical History:  Procedure Laterality Date  . BOWEL RESECTION    . CATARACT EXTRACTION W/ INTRAOCULAR LENS  IMPLANT, BILATERAL Bilateral 2000's  . CHOLECYSTECTOMY N/A 07/25/2013   Procedure: LAPAROSCOPIC CHOLECYSTECTOMY WITH INTRAOPERATIVE CHOLANGIOGRAM;  Surgeon: [name], MD;  Location: [facility];  Service: General;  Laterality: N/A;  . COLON SURGERY    . COLONOSCOPY     Hx: of  . CYSTOSCOPY  WITH BIOPSY N/A 12/01/2019   Procedure: CYSTOSCOPY WITH BLADDER BIOPSY AND FULGURATION;  Surgeon: Lucas Mallow, MD;  Location: [facility];  Service: Urology;  Laterality: N/A;  . EXCISIONAL HEMORRHOIDECTOMY  2000's  . EXTRACORPOREAL SHOCK WAVE LITHOTRIPSY Right 10/03/2018   Procedure: EXTRACORPOREAL SHOCK WAVE LITHOTRIPSY (ESWL);  Surgeon: Festus Aloe, MD;  Location:  [facility];  Service: Urology;  Laterality: Right;  . EYE SURGERY    . INGUINAL HERNIA REPAIR Right 1970's  . LAPAROSCOPIC CHOLECYSTECTOMY  07/25/2013   w/LOA (07/25/2013)       Family History  Problem Relation Age of Onset  . Heart failure Mother   . Diabetes Sister   . Diabetes Sister   . Diabetes Brother   . Hypertension Sister   . Hypertension Sister   . Hypertension Brother   . Heart attack Sister     Social History   Tobacco Use  . Smoking status: Never Smoker  . Smokeless tobacco: Never Used  Vaping Use  . Vaping Use: Never used  Substance Use Topics  . Alcohol use: No  . Drug use: No    Home Medications Prior to Admission medications   Medication Sig Start Date End Date Taking? Authorizing Provider  aspirin EC 81 MG tablet Take 81 mg by mouth daily.   Yes [provider]  atorvastatin (LIPITOR) 20 MG tablet Take 20 mg by mouth every evening.  11/03/13  Yes [provider]  B Complex Vitamins (B COMPLEX PO) Take 1 tablet by mouth daily.   Yes [provider]  brimonidine (ALPHAGAN) 0.2 % ophthalmic solution Place 1 drop into both eyes 2 (two) times daily. 12/28/19  Yes [provider]  cholecalciferol (VITAMIN D-400) 10 MCG (400 UNIT) TABS tablet Take 400 Units by mouth daily.   Yes [provider]  dorzolamide-timolol (COSOPT) 22.3-6.8 MG/ML ophthalmic solution Place 1 drop into both eyes 2 (two) times daily. 03/04/20  Yes [provider]  gabapentin (NEURONTIN) 600 MG tablet Take 600 mg by mouth 2 (two) times daily. 01/20/20  Yes [provider]  insulin detemir (LEVEMIR) 100 UNIT/ML injection Inject 16 Units into the skin 2 (two) times daily.    Yes [provider]  latanoprost (XALATAN) 0.005 % ophthalmic solution Place 1 drop into both eyes at bedtime.  11/17/14  Yes [provider]  levothyroxine (SYNTHROID) 88 MCG tablet Take 88 mcg by mouth at bedtime.  04/21/11  Yes [provider]   metoprolol tartrate (LOPRESSOR) 25 MG tablet Take 25 mg by mouth 2 (two) times daily.   Yes [provider]  olmesartan (BENICAR) 20 MG tablet Take 20 mg by mouth daily. 02/20/20  Yes [provider]  pioglitazone (ACTOS) 30 MG tablet Take 15 mg by mouth daily. 03/04/20  Yes [provider]  RHOPRESSA 0.02 % SOLN Place 1 drop into both eyes at bedtime. 03/05/20  Yes [provider]  Tamsulosin HCl (FLOMAX) 0.4 MG CAPS Take 0.4 mg by mouth 2 (two) times daily.    Yes [provider]  glucose blood test strip  04/04/15   [provider]  HYDROcodone-acetaminophen (NORCO) 5-325 MG tablet Take 1 tablet by mouth every 4 (four) hours as needed for moderate pain. Patient not taking: Reported on 03/19/2020 12/01/19   Lucas Mallow, MD  Multiple Vitamin (MULTIVITAMIN WITH MINERALS) TABS tablet Take 1 tablet by mouth daily. Patient not taking: Reported on 03/23/2019 09/06/13   [name], MD  ondansetron [facility]  ODT) 4 MG disintegrating tablet 4mg  ODT q4 hours prn nausea/vomit Patient not taking: Reported on 11/12/2019 08/24/19   Drenda Freeze, MD  polyethylene glycol [facility]) packet Take 17 g by mouth daily. Patient not taking: Reported on 11/12/2019 09/06/13   [name], MD    Allergies    Metformin and related, Other, and Soliqua [insulin glargine-lixisenatide]  Review of Systems   Review of Systems  All other systems reviewed and are negative.   Physical Exam Updated Vital Signs BP (!) 167/91 (BP Location: Right Arm)   Pulse 87   Temp 98.2 F (36.8 C) (Oral)   Resp 16   Ht 5\' 5"  (1.651 m)   Wt 80.3 kg   SpO2 99%   BMI 29.45 kg/m   Physical Exam Vitals and nursing note reviewed.  Constitutional:      Appearance: He is well-developed.     Comments: Sleepy but arouses to verbal stimuli  HENT:     Head: Normocephalic and atraumatic.  Cardiovascular:     Rate and Rhythm: Normal rate and regular  rhythm.     Heart sounds: No murmur heard.   Pulmonary:     Effort: Pulmonary effort is normal. No respiratory distress.     Breath sounds: Normal breath sounds.  Abdominal:     Palpations: Abdomen is soft.     Tenderness: There is no abdominal tenderness. There is no guarding or rebound.  Musculoskeletal:        General: No tenderness.  Skin:    General: Skin is warm and dry.  Neurological:     Comments: Oriented to person in place. Disoriented to time in recent events. No asymmetry of facial movements. Five out of five strength and bilateral upper extremities. Five out of five strength in left lower extremity. 3 to 5 strength in the right lower extremity.  Psychiatric:        Behavior: Behavior normal.     ED Results / Procedures / Treatments   Labs (all labs ordered are listed, but only abnormal results are displayed) Labs Reviewed  COMPREHENSIVE METABOLIC PANEL - Abnormal; Notable for the following components:      Result Value   Glucose, Bld 111 (*)    Creatinine, Ser 1.35 (*)    Calcium 8.6 (*)    Albumin 2.7 (*)    AST 13 (*)    GFR calc non Af Amer 49 (*)    GFR calc Af Amer 57 (*)    All other components within normal limits  CBC WITH DIFFERENTIAL/PLATELET - Abnormal; Notable for the following components:   RBC 4.20 (*)    HCT 38.8 (*)    Platelets 417 (*)    All other components within normal limits  URINALYSIS, ROUTINE W REFLEX MICROSCOPIC - Abnormal; Notable for the following components:   Hgb urine dipstick LARGE (*)    Ketones, ur 5 (*)    Protein, ur 100 (*)    Leukocytes,Ua SMALL (*)    RBC / HPF >50 (*)    Bacteria, UA RARE (*)    All other components within normal limits  RESPIRATORY PANEL BY RT PCR (FLU A&B, COVID)  URINE CULTURE  CULTURE, BLOOD (ROUTINE X 2)  CULTURE, BLOOD (ROUTINE X 2)  LIPASE, BLOOD  LACTIC ACID, PLASMA  LACTIC ACID, PLASMA  PROTIME-INR  CBG MONITORING, ED  CBG MONITORING, ED  TROPONIN I (HIGH SENSITIVITY)  TROPONIN I  (HIGH SENSITIVITY)    EKG EKG Interpretation  Date/Time:  Friday March 19 2020 07:41:24 EDT Ventricular Rate:  84 PR Interval:    QRS Duration: 93 QT Interval:  394 QTC Calculation: 466 R Axis:   -13 Text Interpretation: Sinus rhythm Probable left atrial enlargement Probable anteroseptal infarct, old Confirmed by Quintella Reichert [phone]) on 03/19/2020 7:45:52 AM   Radiology CT Head Wo Contrast  Result Date: 03/19/2020 CLINICAL DATA:  Mental status change EXAM: CT HEAD WITHOUT CONTRAST TECHNIQUE: Contiguous axial images were obtained from the base of the skull through the vertex without intravenous contrast. COMPARISON:  10/25/2014 head CT and prior. FINDINGS: Brain: No acute infarct or intracranial hemorrhage. No mass lesion. No midline shift or extra-axial fluid collection. Panventricular dilatation is unchanged. Scattered and confluent hypodense foci involving the periventricular and subcortical white matter nonspecific however commonly associated with chronic microvascular ischemic changes. Sequela of more remote bilateral cerebellar insults. Vascular: No hyperdense vessel or unexpected calcification. Bilateral skull base atherosclerotic calcifications. Skull: Negative for fracture or focal lesion. Sinuses/Orbits: Sequela of bilateral lens replacement. Clear paranasal sinuses. No mastoid effusion. Other: None. IMPRESSION: No acute intracranial process. Remote bilateral cerebellar infarcts. Panventricular dilatation, unchanged. Consider normal pressure hydrocephalus. Chronic microvascular ischemic changes are unchanged. Electronically Signed   By: Primitivo Gauze M.D.   On: 03/19/2020 09:25   CT Abdomen Pelvis W Contrast  Result Date: 03/19/2020 CLINICAL DATA:  Acute nonlocalized abdominal pain EXAM: CT ABDOMEN AND PELVIS WITH CONTRAST TECHNIQUE: Multidetector CT imaging of the abdomen and pelvis was performed using the standard protocol following bolus administration of intravenous  contrast. CONTRAST:  65mL OMNIPAQUE IOHEXOL 300 MG/ML  SOLN COMPARISON:  08/24/2019 FINDINGS: Lower chest: Small right more than left pleural effusion and lower lobe atelectasis. Coronary atherosclerosis. Hepatobiliary: No focal liver abnormality.Cholecystectomy. No bile duct dilatation. Pancreas: Unremarkable. Spleen: Unremarkable. Adrenals/Urinary Tract: Negative adrenals. No hydronephrosis or ureteral stone. Punctate right lower pole calculi. Bilateral renal cystic densities which are unchanged. Bladder wall thickening which is circumferential and likely from chronic outlet obstruction. Near calcific density along the mucosal surface anteriorly and inferiorly, also seen previously. Stomach/Bowel: No obstruction. No detected bowel wall thickening, no appendicitis. A small track like soft tissue density structure extends superiorly from the splenic flexure towards the subphrenic collection. Vascular/Lymphatic: No acute vascular abnormality. No mass or adenopathy. Reproductive:Symmetric enlargement of the prostate. Other: Rim enhancing fluid collection in the anterior subphrenic space, contiguous with the right liver. Collection measures 13 cm in length by 3 cm in thickness. Postoperative right groin, possibly for hernia repair. Fatty umbilical hernia. Musculoskeletal: No acute abnormalities. Asymmetric muscular atrophy involving the right hip. IMPRESSION: 1. Right anterior subphrenic collection measuring up to 13 x 3 cm-most likely an abscess. No clear underlying cause but a colonic source is favored given close positioning of the splenic flexure with small track like structure projecting superiorly towards the collection. No active colon inflammation is seen. 2. Small right more than left pleural effusion, likely sympathetic. 3. Chronic findings are described above. Electronically Signed   By: Monte Fantasia M.D.   On: 03/19/2020 11:14   DG Chest Port 1 View  Result Date: 03/19/2020 CLINICAL DATA:  Cough  EXAM: PORTABLE CHEST 1 VIEW COMPARISON:  Oct 25, 2014. FINDINGS: There are pleural effusions, larger on the right than on the left, with bibasilar atelectasis. The lungs elsewhere are clear. The heart size and pulmonary vascularity are normal. No adenopathy. Note that there is an azygos lobe on the right, an anatomic variant. No bone lesions. IMPRESSION: Small pleural effusions bilaterally, larger  on the right than on the left, with bibasilar atelectasis. Lungs elsewhere clear. Heart size normal. Electronically Signed   By: Lowella Grip III M.D.   On: 03/19/2020 07:54    Procedures Procedures (including critical care time)  Medications Ordered in ED Medications  lactated ringers infusion (has no administration in time range)  brimonidine (ALPHAGAN) 0.2 % ophthalmic solution 1 drop (has no administration in time range)  dorzolamide-timolol (COSOPT) 22.3-6.8 MG/ML ophthalmic solution 1 drop (has no administration in time range)  latanoprost (XALATAN) 0.005 % ophthalmic solution 1 drop (has no administration in time range)  Netarsudil Dimesylate 0.02 % SOLN 1 drop (has no administration in time range)  iohexol (OMNIPAQUE) 300 MG/ML solution 75 mL (75 mLs Intravenous Contrast Given 03/19/20 1045)  sodium chloride (PF) 0.9 % injection (  Given 03/19/20 1045)    ED Course  I have reviewed the triage vital signs and the nursing notes.  Pertinent labs & imaging results that were available during my care of the patient were reviewed by me and considered in my medical decision making (see chart for details).    MDM Rules/Calculators/A&P                         Patient presents the emergency department for evaluation of episode of altered mental status earlier today. He has had poor appetite and complained of abdominal pain earlier. On evaluation he is confused but nonfocal. He has no significant abdominal tenderness. Given history of abdominal pain CT abdomen pelvis obtained, which is concerning for  subphrenic abscess. He is currently non-toxic appearing with no systemic symptoms. Will obtain lactate and blood cultures. Will withhold antibiotics pending general surgery recommendations. General surgery consulted for recommendations. Surgery recommends IR consultation. Medicine consulted for admission. Patient and wife updated findings of studies recommendation for admission and they are in agreement with treatment plan.  Final Clinical Impression(s) / ED Diagnoses Final diagnoses:  Subphrenic abscess [facility])  Delirium    Rx / DC Orders ED Discharge Orders    None       Quintella Reichert, MD 03/19/20 [phone]

## 2020-03-19 NOTE — H&P (Signed)
Mike Mack is an 84 y.o. male.   Chief Complaint: Generalized weakness, poor PO intake, RUQ abdominal pain. HPI: The patient is a 84 yr old man who carries a past medical history significant for DM II, BPH, SMO, Post-polio syndrome, Hypertension, and neuropathy. For the past 2 weeks he has had poor PO intake and worsening weakness. He has also had RUQ pain.   In the ED CT was performed that demonstrated a subphrenic abscess. General surgery was consulted, and IR was called. Ultimately, IR did not have time to drain the abscess. They plan to do this in the am. The patient has been started on Rocephin and Flagyl. Blood cultures x 2 have been drawn. It is thought that the patient's abscess is colonic in origin. The patient has had a colon resection in the past. This could be the result of an anastomotic leak.  GI should be called in the am to investigate source of the abscess.  He denies fevers. He has had chills. No nausea or vomiting. No cough, shortness of breath, or sputum production. No chest pain. No neurolgical changes.  [facility] have been consulted to admit the patient for further evaluation and treatment.  Past Medical History:  Diagnosis Date  . Anemia 05/13/2013  . Arthritis    "right leg" (07/25/2013)  . BPH (benign prostatic hyperplasia)   . Constipation 05/13/2013  . Exertional shortness of breath    "sometimes" (07/25/2013)  . History of kidney stones   . History of stomach ulcers   . Hypertension   . Neuropathy   . Polio   . Polio Childhood  . Small bowel obstruction ([place])   . Stroke [facility]) 2014   residual:  "left hand kind of numb" (07/25/2013)  . Type II diabetes mellitus ([place])     Past Surgical History:  Procedure Laterality Date  . BOWEL RESECTION    . CATARACT EXTRACTION W/ INTRAOCULAR LENS  IMPLANT, BILATERAL Bilateral 2000's  . CHOLECYSTECTOMY N/A 07/25/2013   Procedure: LAPAROSCOPIC CHOLECYSTECTOMY WITH INTRAOPERATIVE CHOLANGIOGRAM;  Surgeon: [name], MD;  Location: [place];  Service: General;  Laterality: N/A;  . COLON SURGERY    . COLONOSCOPY     Hx: of  . CYSTOSCOPY WITH BIOPSY N/A 12/01/2019   Procedure: CYSTOSCOPY WITH BLADDER BIOPSY AND FULGURATION;  Surgeon: Lucas Mallow, MD;  Location: [facility];  Service: Urology;  Laterality: N/A;  . EXCISIONAL HEMORRHOIDECTOMY  2000's  . EXTRACORPOREAL SHOCK WAVE LITHOTRIPSY Right 10/03/2018   Procedure: EXTRACORPOREAL SHOCK WAVE LITHOTRIPSY (ESWL);  Surgeon: Festus Aloe, MD;  Location: [facility];  Service: Urology;  Laterality: Right;  . EYE SURGERY    . INGUINAL HERNIA REPAIR Right 1970's  . LAPAROSCOPIC CHOLECYSTECTOMY  07/25/2013   w/LOA (07/25/2013)    Family History  Problem Relation Age of Onset  . Heart failure Mother   . Diabetes Sister   . Diabetes Sister   . Diabetes Brother   . Hypertension Sister   . Hypertension Sister   . Hypertension Brother   . Heart attack Sister    Social History:  reports that he has never smoked. He has never used smokeless tobacco. He reports that he does not drink alcohol and does not use drugs. Medications Prior to Admission  Medication Sig Dispense Refill  . aspirin EC 81 MG tablet Take 81 mg by mouth daily.    Marland Kitchen atorvastatin (LIPITOR) 20 MG tablet Take 20 mg by mouth every evening.     Marland Kitchen B  Complex Vitamins (B COMPLEX PO) Take 1 tablet by mouth daily.    . brimonidine (ALPHAGAN) 0.2 % ophthalmic solution Place 1 drop into both eyes 2 (two) times daily.    . cholecalciferol (VITAMIN D-400) 10 MCG (400 UNIT) TABS tablet Take 400 Units by mouth daily.    . dorzolamide-timolol (COSOPT) 22.3-6.8 MG/ML ophthalmic solution Place 1 drop into both eyes 2 (two) times daily.    Marland Kitchen gabapentin (NEURONTIN) 600 MG tablet Take 600 mg by mouth 2 (two) times daily.    . insulin detemir (LEVEMIR) 100 UNIT/ML injection Inject 16 Units into the skin 2 (two) times daily.     Marland Kitchen latanoprost (XALATAN) 0.005 % ophthalmic solution Place 1 drop into both eyes at  bedtime.     Marland Kitchen levothyroxine (SYNTHROID) 88 MCG tablet Take 88 mcg by mouth at bedtime.     . metoprolol tartrate (LOPRESSOR) 25 MG tablet Take 25 mg by mouth 2 (two) times daily.    Marland Kitchen olmesartan (BENICAR) 20 MG tablet Take 20 mg by mouth daily.    . pioglitazone (ACTOS) 30 MG tablet Take 15 mg by mouth daily.    . RHOPRESSA 0.02 % SOLN Place 1 drop into both eyes at bedtime.    . Tamsulosin HCl (FLOMAX) 0.4 MG CAPS Take 0.4 mg by mouth 2 (two) times daily.     Marland Kitchen glucose blood test strip     . HYDROcodone-acetaminophen (NORCO) 5-325 MG tablet Take 1 tablet by mouth every 4 (four) hours as needed for moderate pain. (Patient not taking: Reported on 03/19/2020) 6 tablet 0  . Multiple Vitamin (MULTIVITAMIN WITH MINERALS) TABS tablet Take 1 tablet by mouth daily. (Patient not taking: Reported on 03/23/2019)    . ondansetron (ZOFRAN ODT) 4 MG disintegrating tablet 4mg  ODT q4 hours prn nausea/vomit (Patient not taking: Reported on 11/12/2019) 10 tablet 0  . polyethylene glycol (MIRALAX / GLYCOLAX) packet Take 17 g by mouth daily. (Patient not taking: Reported on 11/12/2019) 14 each 0    Allergies:  Allergies  Allergen Reactions  . Metformin And Related Other (See Comments)    Gi intolerance  . Other     Other reaction(s): Other (See Comments) Renal insufficiency Other reaction(s): GI Upset (intolerance) Gi intolerance  . [name] [Insulin Glargine-Lixisenatide] Other (See Comments)    Stomach cramps    Pertinent items noted in HPI and remainder of comprehensive ROS otherwise negative.   General appearance: alert, cooperative and mild distress Head: Normocephalic, without obvious abnormality, atraumatic Eyes: conjunctivae/corneas clear. PERRL, EOM's intact. Fundi benign. Throat: lips, mucosa, and tongue normal; teeth and gums normal Neck: no adenopathy, no carotid bruit, no JVD, supple, symmetrical, trachea midline and thyroid not enlarged, symmetric, no tenderness/mass/nodules Resp: No  increased work of breathing. No wheezes rales, or rhonchi. No tactile fremitus. Chest wall: no tenderness Cardio: regular rate and rhythm, S1, S2 normal, no murmur, click, rub or gallop GI: Soft. Positive for tenderness in the right upper quadrant. Non-distended. Hypoactive bowel sounds. No masses, organomegaly, or hernias are appreciated. Extremities: extremities normal, atraumatic, no cyanosis or edema Pulses: 2+ and symmetric Skin: Skin color, texture, turgor normal. No rashes or lesions Lymph nodes: Cervical, supraclavicular, and axillary nodes normal. Neurologic: Alert and oriented X 3, normal strength and tone. Normal symmetric reflexes. Normal coordination and gait  Results for orders placed or performed during the hospital encounter of 03/19/20 (from the past 48 hour(s))  Comprehensive metabolic panel     Status: Abnormal   Collection Time: 03/19/20  6:41 AM  Result Value Ref Range   Sodium 138 135 - 145 mmol/L   Potassium 4.4 3.5 - 5.1 mmol/L   Chloride 103 98 - 111 mmol/L   CO2 24 22 - 32 mmol/L   Glucose, Bld 111 (H) 70 - 99 mg/dL    Comment: Glucose reference range applies only to samples taken after fasting for at least 8 hours.   BUN 12 8 - 23 mg/dL   Creatinine, Ser 1.35 (H) 0.61 - 1.24 mg/dL   Calcium 8.6 (L) 8.9 - 10.3 mg/dL   Total Protein 7.6 6.5 - 8.1 g/dL   Albumin 2.7 (L) 3.5 - 5.0 g/dL   AST 13 (L) 15 - 41 U/L   ALT 13 0 - 44 U/L   Alkaline Phosphatase 88 38 - 126 U/L   Total Bilirubin 0.8 0.3 - 1.2 mg/dL   GFR calc non Af Amer 49 (L) >60 mL/min   GFR calc Af Amer 57 (L) >60 mL/min   Anion gap 11 5 - 15    Comment: Performed at [facility], [place] [address], [place], [place] [address]  CBC with Differential     Status: Abnormal   Collection Time: 03/19/20  6:44 AM  Result Value Ref Range   WBC 9.8 4.0 - 10.5 K/uL   RBC 4.20 (L) 4.22 - 5.81 MIL/uL   Hemoglobin 13.1 13.0 - 17.0 g/dL   HCT 38.8 (L) 39 - 52 %   MCV 92.4 80.0 - 100.0 fL    MCH 31.2 26.0 - 34.0 pg   MCHC 33.8 30.0 - 36.0 g/dL   RDW 13.2 11.5 - 15.5 %   Platelets 417 (H) 150 - 400 K/uL   nRBC 0.0 0.0 - 0.2 %   Neutrophils Relative % 76 %   Neutro Abs 7.5 1.7 - 7.7 K/uL   Lymphocytes Relative 15 %   Lymphs Abs 1.5 0.7 - 4.0 K/uL   Monocytes Relative 8 %   Monocytes Absolute 0.7 0 - 1 K/uL   Eosinophils Relative 0 %   Eosinophils Absolute 0.0 0 - 0 K/uL   Basophils Relative 0 %   Basophils Absolute 0.0 0 - 0 K/uL   Immature Granulocytes 1 %   Abs Immature Granulocytes 0.05 0.00 - 0.07 K/uL    Comment: Performed at [facility], [place] [address], [place], [place] [address]  POC CBG, ED     Status: None   Collection Time: 03/19/20  6:46 AM  Result Value Ref Range   Glucose-Capillary 89 70 - 99 mg/dL    Comment: Glucose reference range applies only to samples taken after fasting for at least 8 hours.  Lipase, blood     Status: None   Collection Time: 03/19/20  7:15 AM  Result Value Ref Range   Lipase 20 11 - 51 U/L    Comment: Performed at [facility], [place] [address] [place], [place] [address]  Troponin I (High Sensitivity)     Status: None   Collection Time: 03/19/20  7:15 AM  Result Value Ref Range   Troponin I (High Sensitivity) 6 <18 ng/L    Comment: (NOTE) Elevated high sensitivity troponin I (hsTnI) values and significant  changes across serial measurements may suggest ACS but many other  chronic and acute conditions are known to elevate hsTnI results.  Refer to the "Links" section for chest pain algorithms and additional  guidance. Performed at [facility], [place] [address]  [place], [place] [address]   Respiratory Panel by RT PCR (Flu A&B, Covid) - Nasopharyngeal Swab     Status: None   Collection Time: 03/19/20  7:15 AM   Specimen: Nasopharyngeal Swab  Result Value Ref Range   SARS Coronavirus 2 by RT PCR NEGATIVE NEGATIVE    Comment: (NOTE) SARS-CoV-2 target nucleic acids are NOT  DETECTED.  The SARS-CoV-2 RNA is generally detectable in upper respiratoy specimens during the acute phase of infection. The lowest concentration of SARS-CoV-2 viral copies this assay can detect is 131 copies/mL. A negative result does not preclude SARS-Cov-2 infection and should not be used as the sole basis for treatment or other patient management decisions. A negative result may occur with  improper specimen collection/handling, submission of specimen other than nasopharyngeal swab, presence of viral mutation(s) within the areas targeted by this assay, and inadequate number of viral copies (<131 copies/mL). A negative result must be combined with clinical observations, patient history, and epidemiological information. The expected result is Negative.  Fact Sheet for Patients:  PinkCheek.be  Fact Sheet for Healthcare Providers:  GravelBags.it  This test is no t yet approved or cleared by the [place] FDA and  has been authorized for detection and/or diagnosis of SARS-CoV-2 by FDA under an Emergency Use Authorization (EUA). This EUA will remain  in effect (meaning this test can be used) for the duration of the COVID-19 declaration under Section 564(b)(1) of the Act, 21 U.S.C. section 360bbb-3(b)(1), unless the authorization is terminated or revoked sooner.     Influenza A by PCR NEGATIVE NEGATIVE   Influenza B by PCR NEGATIVE NEGATIVE    Comment: (NOTE) The Xpert Xpress SARS-CoV-2/FLU/RSV assay is intended as an aid in  the diagnosis of influenza from Nasopharyngeal swab specimens and  should not be used as a sole basis for treatment. Nasal washings and  aspirates are unacceptable for Xpert Xpress SARS-CoV-2/FLU/RSV  testing.  Fact Sheet for Patients: PinkCheek.be  Fact Sheet for Healthcare Providers: GravelBags.it  This test is not yet approved or  cleared by the [place] FDA and  has been authorized for detection and/or diagnosis of SARS-CoV-2 by  FDA under an Emergency Use Authorization (EUA). This EUA will remain  in effect (meaning this test can be used) for the duration of the  Covid-19 declaration under Section 564(b)(1) of the Act, 21  U.S.C. section 360bbb-3(b)(1), unless the authorization is  terminated or revoked. Performed at [facility], [place] [address] [place], [place] [address]   Urinalysis, Routine w reflex microscopic Urine, Clean Catch     Status: Abnormal   Collection Time: 03/19/20  9:33 AM  Result Value Ref Range   Color, Urine YELLOW YELLOW   APPearance CLEAR CLEAR   Specific Gravity, Urine 1.013 1.005 - 1.030   pH 7.0 5.0 - 8.0   Glucose, UA NEGATIVE NEGATIVE mg/dL   Hgb urine dipstick LARGE (A) NEGATIVE   Bilirubin Urine NEGATIVE NEGATIVE   Ketones, ur 5 (A) NEGATIVE mg/dL   Protein, ur 100 (A) NEGATIVE mg/dL   Nitrite NEGATIVE NEGATIVE   Leukocytes,Ua SMALL (A) NEGATIVE   RBC / HPF >50 (H) 0 - 5 RBC/hpf   WBC, UA 21-50 0 - 5 WBC/hpf   Bacteria, UA RARE (A) NONE SEEN   Squamous Epithelial / LPF 0-5 0 - 5   Mucus PRESENT     Comment: Performed at [facility], [place] [address], [place], [place] [address]  Troponin I (High Sensitivity)  Status: None   Collection Time: 03/19/20 10:42 AM  Result Value Ref Range   Troponin I (High Sensitivity) 7 <18 ng/L    Comment: (NOTE) Elevated high sensitivity troponin I (hsTnI) values and significant  changes across serial measurements may suggest ACS but many other  chronic and acute conditions are known to elevate hsTnI results.  Refer to the "Links" section for chest pain algorithms and additional  guidance. Performed at [facility], [place] [address], [place], [place] [address]   Lactic acid, plasma     Status: None   Collection Time: 03/19/20 11:45 AM  Result Value Ref Range   Lactic Acid,  Venous 1.2 0.5 - 1.9 mmol/L    Comment: Performed at [facility], [place] [address], [place], [place] [address]  Protime-INR     Status: Abnormal   Collection Time: 03/19/20  4:54 PM  Result Value Ref Range   Prothrombin Time 15.7 (H) 11.4 - 15.2 seconds   INR 1.3 (H) 0.8 - 1.2    Comment: (NOTE) INR goal varies based on device and disease states. Performed at [facility], [place] [address] [place], [place] [address]    @[id number] @  Blood pressure (!) 187/93, pulse 93, temperature 98.2 F (36.8 C), temperature source Oral, resp. rate 16, height 5\' 5"  (1.651 m), weight 80.3 kg, SpO2 98 %.   Assessment/Plan Problem  Subphrenic Abscess (Hcc)  Failure to Thrive (Child)  Controlled Type 2 Diabetes Mellitus With Stable Proliferative Retinopathy of Both Eyes, With Long-Term Current Use of Insulin (Hcc)  Debility  Generalized Weakness  Ruq Abdominal Pain  Leukocytosis  Stroke (HCC) (Resolved)   Subphrenic abscess/RUQ pain: Seen on CT abdomen. IR has been consulted and will place a drain tomorrow. He will be NPO overnight. Radiology suggests a colonic source. GI has been consulted for this, but they were not called as it was thought that the patient would be gone to IR on the afternoon of admission. They will need to be called in the morning. General surgery was consulted. The patient has been started on Rocephin and Flagyl.  Debility/Generlized weakness/Failure to Thrive: The patient will likely require SNF at discharge. PT/OT to eval and treat when appropriate.  DM II: Last HbA1c was 8.1 in 11/2019. Will repeat. The patient takes levemir 16 units Q12 hours at home. He will be NPO overnight. I will hold levemir for now, but follow glucoses with FSBS and SSI.  Leukocytosis: Due to abscess. Mild.  CKD IIIA: Stable. Monitor. Avoid nephrotoxic substances and hypotension. Follow creatinine, electrolytes, and volume status.  I have seen and examined this  patient myself. I have spent 72 minutes in his evaluation and admission.  DVT Prophylaxis: SCD's CODE STATUS: Full Code Family Communication: None available Disposition: The patient is being admitted as an inpatient to a medical bed.  Status is: Inpatient  Remains inpatient appropriate because:Inpatient level of care appropriate due to severity of illness   Dispo: The patient is from: Home              Anticipated d/c is to: Home              Anticipated d/c date is: > 3 days              Patient currently is not medically stable to d/c. Severity of Illness: The appropriate patient status for this patient is INPATIENT. Inpatient status is judged to be reasonable and necessary in order to provide the  required intensity of service to ensure the patient's safety. The patient's presenting symptoms, physical exam findings, and initial radiographic and laboratory data in the context of their chronic comorbidities is felt to place them at high risk for further clinical deterioration. Furthermore, it is not anticipated that the patient will be medically stable for discharge from the hospital within 2 midnights of admission. The following factors support the patient status of inpatient.   " The patient's presenting symptoms include abdominal pain, fever, failure to thrive, weakness. " The worrisome physical exam findings include RUQ tenderness. " The initial radiographic and laboratory data are worrisome because of subphrenic tenderness. " The chronic co-morbidities include DM II.   * I certify that at the point of admission it is my clinical judgment that the patient will require inpatient hospital care spanning beyond 2 midnights from the point of admission due to high intensity of service, high risk for further deterioration and high frequency of surveillance required.*   Lesley Galentine 03/19/2020, 5:49 PM

## 2020-03-20 ENCOUNTER — Inpatient Hospital Stay (HOSPITAL_COMMUNITY): Payer: Medicare HMO

## 2020-03-20 DIAGNOSIS — R531 Weakness: Secondary | ICD-10-CM | POA: Diagnosis not present

## 2020-03-20 DIAGNOSIS — E113553 Type 2 diabetes mellitus with stable proliferative diabetic retinopathy, bilateral: Secondary | ICD-10-CM

## 2020-03-20 DIAGNOSIS — K651 Peritoneal abscess: Secondary | ICD-10-CM | POA: Diagnosis not present

## 2020-03-20 DIAGNOSIS — Z794 Long term (current) use of insulin: Secondary | ICD-10-CM

## 2020-03-20 DIAGNOSIS — R1011 Right upper quadrant pain: Secondary | ICD-10-CM

## 2020-03-20 DIAGNOSIS — Z8673 Personal history of transient ischemic attack (TIA), and cerebral infarction without residual deficits: Secondary | ICD-10-CM

## 2020-03-20 DIAGNOSIS — R5381 Other malaise: Secondary | ICD-10-CM | POA: Diagnosis not present

## 2020-03-20 LAB — COMPREHENSIVE METABOLIC PANEL
ALT: 14 U/L (ref 0–44)
AST: 15 U/L (ref 15–41)
Albumin: 2.6 g/dL — ABNORMAL LOW (ref 3.5–5.0)
Alkaline Phosphatase: 86 U/L (ref 38–126)
Anion gap: 11 (ref 5–15)
BUN: 12 mg/dL (ref 8–23)
CO2: 22 mmol/L (ref 22–32)
Calcium: 8.6 mg/dL — ABNORMAL LOW (ref 8.9–10.3)
Chloride: 101 mmol/L (ref 98–111)
Creatinine, Ser: 1.24 mg/dL (ref 0.61–1.24)
GFR calc Af Amer: >60 mL/min (ref >60)
GFR calc non Af Amer: 54 mL/min — ABNORMAL LOW (ref >60)
Glucose, Bld: 107 mg/dL — ABNORMAL HIGH (ref 70–99)
Potassium: 4.3 mmol/L (ref 3.5–5.1)
Sodium: 134 mmol/L — ABNORMAL LOW (ref 135–145)
Total Bilirubin: 1.1 mg/dL (ref 0.3–1.2)
Total Protein: 7.4 g/dL (ref 6.5–8.1)

## 2020-03-20 LAB — GLUCOSE, CAPILLARY
Glucose-Capillary: 106 mg/dL — ABNORMAL HIGH (ref 70–99)
Glucose-Capillary: 111 mg/dL — ABNORMAL HIGH (ref 70–99)
Glucose-Capillary: 115 mg/dL — ABNORMAL HIGH (ref 70–99)
Glucose-Capillary: 133 mg/dL — ABNORMAL HIGH (ref 70–99)
Glucose-Capillary: 96 mg/dL (ref 70–99)
Glucose-Capillary: 97 mg/dL (ref 70–99)
Glucose-Capillary: 99 mg/dL (ref 70–99)

## 2020-03-20 LAB — URINE CULTURE: Culture: NO GROWTH

## 2020-03-20 LAB — CBC
HCT: 40.3 % (ref 39.0–52.0)
Hemoglobin: 13 g/dL (ref 13.0–17.0)
MCH: 29.8 pg (ref 26.0–34.0)
MCHC: 32.3 g/dL (ref 30.0–36.0)
MCV: 92.4 fL (ref 80.0–100.0)
Platelets: 383 10*3/uL (ref 150–400)
RBC: 4.36 MIL/uL (ref 4.22–5.81)
RDW: 13.1 % (ref 11.5–15.5)
WBC: 10.1 10*3/uL (ref 4.0–10.5)
nRBC: 0 % (ref 0.0–0.2)

## 2020-03-20 LAB — MRSA PCR SCREENING: MRSA by PCR: NEGATIVE

## 2020-03-20 MED ORDER — SODIUM CHLORIDE 0.9 % IV SOLN
INTRAVENOUS | Status: AC
  Filled 2020-03-20: qty 1000

## 2020-03-20 MED ORDER — FENTANYL CITRATE (PF) 100 MCG/2ML IJ SOLN
INTRAMUSCULAR | Status: AC | PRN
  Administered 2020-03-20: 25 ug via INTRAVENOUS

## 2020-03-20 MED ORDER — HYDRALAZINE HCL 20 MG/ML IJ SOLN
10.0000 mg | Freq: Four times a day (QID) | INTRAMUSCULAR | Status: DC | PRN
  Administered 2020-03-20 – 2020-03-21 (×3): 10 mg via INTRAVENOUS
  Filled 2020-03-20 (×4): qty 1

## 2020-03-20 MED ORDER — MIDAZOLAM HCL 2 MG/2ML IJ SOLN
INTRAMUSCULAR | Status: AC
  Filled 2020-03-20: qty 2

## 2020-03-20 MED ORDER — FENTANYL CITRATE (PF) 100 MCG/2ML IJ SOLN
INTRAMUSCULAR | Status: AC
  Filled 2020-03-20: qty 2

## 2020-03-20 MED ORDER — CHLORHEXIDINE GLUCONATE CLOTH 2 % EX PADS
6.0000 | MEDICATED_PAD | Freq: Every day | CUTANEOUS | Status: DC
  Administered 2020-03-20 – 2020-03-25 (×5): 6 via TOPICAL

## 2020-03-20 MED ORDER — SODIUM CHLORIDE 0.9% FLUSH
5.0000 mL | Freq: Three times a day (TID) | INTRAVENOUS | Status: DC
  Administered 2020-03-20 – 2020-03-25 (×14): 5 mL

## 2020-03-20 MED ORDER — MIDAZOLAM HCL 2 MG/2ML IJ SOLN
INTRAMUSCULAR | Status: AC | PRN
  Administered 2020-03-20: 0.5 mg via INTRAVENOUS

## 2020-03-20 MED ORDER — HYDROCODONE-ACETAMINOPHEN 5-325 MG PO TABS
1.0000 | ORAL_TABLET | ORAL | Status: DC | PRN
  Administered 2020-03-20: 1 via ORAL
  Filled 2020-03-20 (×2): qty 1

## 2020-03-20 NOTE — Progress Notes (Signed)
Patient has elevated BP of 188/85. No PRN medications to give. MD on call notified per paging system. No new orders as of yet. Patient is stable and being monitored. Dawson Bills, RN

## 2020-03-20 NOTE — Plan of Care (Signed)
  Problem: Education: Goal: Knowledge of General Education information will improve Description: Including pain rating scale, medication(s)/side effects and non-pharmacologic comfort measures Outcome: Progressing   Problem: Clinical Measurements: Goal: Will remain free from infection Outcome: Progressing   Problem: Clinical Measurements: Goal: Ability to maintain clinical measurements within normal limits will improve Outcome: Progressing   

## 2020-03-20 NOTE — Procedures (Signed)
Interventional Radiology Procedure Note  Procedure: Image guided drain placement, subphrenic, right.  24F pigtail drain.  Complications: None  EBL: None Sample: Culture sent  Recommendations: - Routine drain care, with sterile flushes, record output - follow up Cx - routine wound care  Signed,  Dulcy Fanny. Earleen Newport, DO

## 2020-03-20 NOTE — Consult Note (Signed)
Referring Provider: [facility] Primary Care Physician:  Jani Gravel, MD Primary Gastroenterologist: None (unassigned)  Reason for Consultation: Subhepatic abscess with possible colonic origin  HPI: Mike Mack is a 84 y.o. male with a remote history of a subhepatic abscess following laparoscopic cholecystectomy 6 years ago, was admitted to the hospital yesterday because of weakness and right upper quadrant pain and decreased p.o. intake.    A CT scan in the ER showed a 13 cm subphrenic abscess in contiguity with the right lobe of the liver and the hepatic flexure of the colon; the colon appeared to have a tract emanating from it although no underlying colonic pathology such as diverticular change, wall thickening, or mass-effect is evident on the CT in that area, based on telephone conversation with the radiologist this evening.    The abscess was drained percutaneously by IR this morning.  We were consulted to ascertain appropriateness of colonoscopic evaluation to look for a colonic source of this abscess.  From speaking with the patient's wife, it sounds as though the patient had a colonoscopy many years ago, although details are unclear and there is nothing on the electronic record, so it must have been over 10 years ago.  The patient has significant dementia but is cared for at home by his wife.  .    Past Medical History:  Diagnosis Date  . Anemia 05/13/2013  . Arthritis    "right leg" (07/25/2013)  . BPH (benign prostatic hyperplasia)   . Constipation 05/13/2013  . Exertional shortness of breath    "sometimes" (07/25/2013)  . History of kidney stones   . History of stomach ulcers   . Hypertension   . Neuropathy   . Polio   . Polio Childhood  . Small bowel obstruction ([place])   . Stroke [facility]) 2014   residual:  "left hand kind of numb" (07/25/2013)  . Type II diabetes mellitus ([place])     Past Surgical History:  Procedure Laterality Date  . BOWEL RESECTION    . CATARACT EXTRACTION  W/ INTRAOCULAR LENS  IMPLANT, BILATERAL Bilateral 2000's  . CHOLECYSTECTOMY N/A 07/25/2013   Procedure: LAPAROSCOPIC CHOLECYSTECTOMY WITH INTRAOPERATIVE CHOLANGIOGRAM;  Surgeon: [name], MD;  Location: [place];  Service: General;  Laterality: N/A;  . COLON SURGERY    . COLONOSCOPY     Hx: of  . CYSTOSCOPY WITH BIOPSY N/A 12/01/2019   Procedure: CYSTOSCOPY WITH BLADDER BIOPSY AND FULGURATION;  Surgeon: Lucas Mallow, MD;  Location: [facility];  Service: Urology;  Laterality: N/A;  . EXCISIONAL HEMORRHOIDECTOMY  2000's  . EXTRACORPOREAL SHOCK WAVE LITHOTRIPSY Right 10/03/2018   Procedure: EXTRACORPOREAL SHOCK WAVE LITHOTRIPSY (ESWL);  Surgeon: Festus Aloe, MD;  Location: [facility];  Service: Urology;  Laterality: Right;  . EYE SURGERY    . INGUINAL HERNIA REPAIR Right 1970's  . LAPAROSCOPIC CHOLECYSTECTOMY  07/25/2013   w/LOA (07/25/2013)    Prior to Admission medications   Medication Sig Start Date End Date Taking? Authorizing Provider  aspirin EC 81 MG tablet Take 81 mg by mouth daily.   Yes [provider]  atorvastatin (LIPITOR) 20 MG tablet Take 20 mg by mouth every evening.  11/03/13  Yes [provider]  B Complex Vitamins (B COMPLEX PO) Take 1 tablet by mouth daily.   Yes [provider]  brimonidine (ALPHAGAN) 0.2 % ophthalmic solution Place 1 drop into both eyes 2 (two) times daily. 12/28/19  Yes [provider]  cholecalciferol (VITAMIN D-400) 10 MCG (400 UNIT)  TABS tablet Take 400 Units by mouth daily.   Yes [provider]  dorzolamide-timolol (COSOPT) 22.3-6.8 MG/ML ophthalmic solution Place 1 drop into both eyes 2 (two) times daily. 03/04/20  Yes [provider]  gabapentin (NEURONTIN) 600 MG tablet Take 600 mg by mouth 2 (two) times daily. 01/20/20  Yes [provider]  insulin detemir (LEVEMIR) 100 UNIT/ML injection Inject 16 Units into the skin 2 (two) times daily.    Yes [provider]  latanoprost  (XALATAN) 0.005 % ophthalmic solution Place 1 drop into both eyes at bedtime.  11/17/14  Yes [provider]  levothyroxine (SYNTHROID) 88 MCG tablet Take 88 mcg by mouth at bedtime.  04/21/11  Yes [provider]  metoprolol tartrate (LOPRESSOR) 25 MG tablet Take 25 mg by mouth 2 (two) times daily.   Yes [provider]  olmesartan (BENICAR) 20 MG tablet Take 20 mg by mouth daily. 02/20/20  Yes [provider]  pioglitazone (ACTOS) 30 MG tablet Take 15 mg by mouth daily. 03/04/20  Yes [provider]  RHOPRESSA 0.02 % SOLN Place 1 drop into both eyes at bedtime. 03/05/20  Yes [provider]  Tamsulosin HCl (FLOMAX) 0.4 MG CAPS Take 0.4 mg by mouth 2 (two) times daily.    Yes [provider]  glucose blood test strip  04/04/15   [provider]  HYDROcodone-acetaminophen (NORCO) 5-325 MG tablet Take 1 tablet by mouth every 4 (four) hours as needed for moderate pain. Patient not taking: Reported on 03/19/2020 12/01/19   Lucas Mallow, MD  Multiple Vitamin (MULTIVITAMIN WITH MINERALS) TABS tablet Take 1 tablet by mouth daily. Patient not taking: Reported on 03/23/2019 09/06/13   [name], MD  ondansetron (ZOFRAN ODT) 4 MG disintegrating tablet 4mg  ODT q4 hours prn nausea/vomit Patient not taking: Reported on 11/12/2019 08/24/19   Drenda Freeze, MD  polyethylene glycol [facility] / Floria Raveling) packet Take 17 g by mouth daily. Patient not taking: Reported on 11/12/2019 09/06/13   [name], MD    Current Facility-Administered Medications  Medication Dose Route Frequency Provider Last Rate Last Admin  . brimonidine (ALPHAGAN) 0.2 % ophthalmic solution 1 drop  1 drop Both Eyes BID Swayze, Ava, DO   1 drop at 03/20/20 1030  . cefTRIAXone (ROCEPHIN) 2 g in sodium chloride 0.9 % 100 mL IVPB  2 g Intravenous q1800 Swayze, Ava, DO 200 mL/hr at 03/20/20 1745 2 g at 03/20/20 1745  . Chlorhexidine Gluconate Cloth 2 % PADS 6  each  6 each Topical Daily Swayze, Ava, DO   6 each at 03/20/20 1032  . dorzolamide-timolol (COSOPT) 22.3-6.8 MG/ML ophthalmic solution 1 drop  1 drop Both Eyes BID Swayze, Ava, DO   1 drop at 03/20/20 1030  . fentaNYL (SUBLIMAZE) 100 MCG/2ML injection           . hydrALAZINE (APRESOLINE) injection 10 mg  10 mg Intravenous Q6H PRN Hollace Hayward K, NP   10 mg at 03/20/20 0726  . insulin aspart (novoLOG) injection 0-15 Units  0-15 Units Subcutaneous Q4H Swayze, Ava, DO      . lactated ringers infusion   Intravenous Continuous Swayze, Ava, DO 75 mL/hr at 03/19/20 1601 New Bag at 03/19/20 1601  . latanoprost (XALATAN) 0.005 % ophthalmic solution 1 drop  1 drop Both Eyes QHS Swayze, Ava, DO   1 drop at 03/19/20 2303  . metoprolol tartrate (LOPRESSOR) tablet 25 mg  25 mg Oral BID Swayze, Ava,  DO   25 mg at 03/20/20 1027  . metroNIDAZOLE (FLAGYL) IVPB 500 mg  500 mg Intravenous Q8H Swayze, Ava, DO 100 mL/hr at 03/20/20 1342 500 mg at 03/20/20 1342  . midazolam (VERSED) 2 MG/2ML injection           . Netarsudil Dimesylate 0.02 % SOLN 1 drop  1 drop Both Eyes QHS Swayze, Ava, DO      . sodium chloride 0.9 % infusion        Stopped at 03/20/20 1630    Allergies as of 03/19/2020 - Review Complete 03/19/2020  Allergen Reaction Noted  . Metformin and related Other (See Comments) 03/23/2019  . Other  03/23/2019  . Soliqua [insulin glargine-lixisenatide] Other (See Comments) 03/23/2019    Family History  Problem Relation Age of Onset  . Heart failure Mother   . Diabetes Sister   . Diabetes Sister   . Diabetes Brother   . Hypertension Sister   . Hypertension Sister   . Hypertension Brother   . Heart attack Sister     Social History   Socioeconomic History  . Marital status: Married    Spouse name: Ashton Belote  . Number of children: 2  . Years of education: Not on file  . Highest education level: Not on file  Occupational History  . Occupation: Retired, Curator  Tobacco Use  .  Smoking status: Never Smoker  . Smokeless tobacco: Never Used  Vaping Use  . Vaping Use: Never used  Substance and Sexual Activity  . Alcohol use: No  . Drug use: No  . Sexual activity: Yes  Other Topics Concern  . Not on file  Social History Narrative   Married.  Lives in [place] with wife.  Ambulates with a cane.   Social Determinants of Health   Financial Resource Strain:   . Difficulty of Paying Living Expenses: Not on file  Food Insecurity:   . Worried About Charity fundraiser in the Last Year: Not on file  . Ran Out of Food in the Last Year: Not on file  Transportation Needs:   . Lack of Transportation (Medical): Not on file  . Lack of Transportation (Non-Medical): Not on file  Physical Activity:   . Days of Exercise per Week: Not on file  . Minutes of Exercise per Session: Not on file  Stress:   . Feeling of Stress : Not on file  Social Connections:   . Frequency of Communication with Friends and Family: Not on file  . Frequency of Social Gatherings with Friends and Family: Not on file  . Attends Religious Services: Not on file  . Active Member of Clubs or Organizations: Not on file  . Attends Archivist Meetings: Not on file  . Marital Status: Not on file  Intimate Partner Violence:   . Fear of Current or Ex-Partner: Not on file  . Emotionally Abused: Not on file  . Physically Abused: Not on file  . Sexually Abused: Not on file    Review of Systems: See HPI  Physical Exam: Vital signs in last 24 hours: Temp:  [98.2 F (36.8 C)-98.9 F (37.2 C)] 98.2 F (36.8 C) (10/02 1641) Pulse Rate:  [65-83] 74 (10/02 1641) Resp:  [15-18] 18 (10/02 1641) BP: (160-198)/(63-94) 175/84 (10/02 1641) SpO2:  [94 %-100 %] 98 % (10/02 1641) Last BM Date: 03/19/20 General:   Alert,  Well-developed, well-nourished, pleasant and cooperative in NAD Head:  Normocephalic and atraumatic. Eyes:  Sclera clear,  no icterus.    Lungs:  Clear throughout to auscultation.    No wheezes, crackles, or rhonchi. No evident respiratory distress. Heart:   Regular rate and rhythm; no murmurs, clicks, rubs,  or gallops. Abdomen:  Soft, nontender, nontympanitic, and nondistended. No masses, hepatosplenomegaly or ventral hernias noted. Normal bowel sounds.  Percutaneous drain in place. Extremities:   Without edema. Neurologic: Not oriented to name or place.  Alert, pleasant, follows commands. Psych:   Alert and cooperative. Normal mood and affect.  However, disoriented.  Intake/Output from previous day: 10/01 0701 - 10/02 0700 In: 1102.3 [P.O.:120; I.V.:882.3; IV Piggyback:100] Out: 102 [Urine:101; Stool:1] Intake/Output this shift: No intake/output data recorded.  Lab Results: Recent Labs    03/19/20 0644 03/20/20 0536  WBC 9.8 10.1  HGB 13.1 13.0  HCT 38.8* 40.3  PLT 417* 383   BMET Recent Labs    03/19/20 0641 03/20/20 0536  NA 138 134*  K 4.4 4.3  CL 103 101  CO2 24 22  GLUCOSE 111* 107*  BUN 12 12  CREATININE 1.35* 1.24  CALCIUM 8.6* 8.6*   LFT Recent Labs    03/20/20 0536  PROT 7.4  ALBUMIN 2.6*  AST 15  ALT 14  ALKPHOS 86  BILITOT 1.1   PT/INR Recent Labs    03/19/20 1654  LABPROT 15.7*  INR 1.3*    Studies/Results: CT Head Wo Contrast  Result Date: 03/19/2020 CLINICAL DATA:  Mental status change EXAM: CT HEAD WITHOUT CONTRAST TECHNIQUE: Contiguous axial images were obtained from the base of the skull through the vertex without intravenous contrast. COMPARISON:  10/25/2014 head CT and prior. FINDINGS: Brain: No acute infarct or intracranial hemorrhage. No mass lesion. No midline shift or extra-axial fluid collection. Panventricular dilatation is unchanged. Scattered and confluent hypodense foci involving the periventricular and subcortical white matter nonspecific however commonly associated with chronic microvascular ischemic changes. Sequela of more remote bilateral cerebellar insults. Vascular: No hyperdense vessel or  unexpected calcification. Bilateral skull base atherosclerotic calcifications. Skull: Negative for fracture or focal lesion. Sinuses/Orbits: Sequela of bilateral lens replacement. Clear paranasal sinuses. No mastoid effusion. Other: None. IMPRESSION: No acute intracranial process. Remote bilateral cerebellar infarcts. Panventricular dilatation, unchanged. Consider normal pressure hydrocephalus. Chronic microvascular ischemic changes are unchanged. Electronically Signed   By: Primitivo Gauze M.D.   On: 03/19/2020 09:25   CT Abdomen Pelvis W Contrast  Result Date: 03/19/2020 CLINICAL DATA:  Acute nonlocalized abdominal pain EXAM: CT ABDOMEN AND PELVIS WITH CONTRAST TECHNIQUE: Multidetector CT imaging of the abdomen and pelvis was performed using the standard protocol following bolus administration of intravenous contrast. CONTRAST:  13mL OMNIPAQUE IOHEXOL 300 MG/ML  SOLN COMPARISON:  08/24/2019 FINDINGS: Lower chest: Small right more than left pleural effusion and lower lobe atelectasis. Coronary atherosclerosis. Hepatobiliary: No focal liver abnormality.Cholecystectomy. No bile duct dilatation. Pancreas: Unremarkable. Spleen: Unremarkable. Adrenals/Urinary Tract: Negative adrenals. No hydronephrosis or ureteral stone. Punctate right lower pole calculi. Bilateral renal cystic densities which are unchanged. Bladder wall thickening which is circumferential and likely from chronic outlet obstruction. Near calcific density along the mucosal surface anteriorly and inferiorly, also seen previously. Stomach/Bowel: No obstruction. No detected bowel wall thickening, no appendicitis. A small track like soft tissue density structure extends superiorly from the splenic flexure towards the subphrenic collection. Vascular/Lymphatic: No acute vascular abnormality. No mass or adenopathy. Reproductive:Symmetric enlargement of the prostate. Other: Rim enhancing fluid collection in the anterior subphrenic space, contiguous with  the right liver. Collection measures 13 cm in length by 3 cm  in thickness. Postoperative right groin, possibly for hernia repair. Fatty umbilical hernia. Musculoskeletal: No acute abnormalities. Asymmetric muscular atrophy involving the right hip. IMPRESSION: 1. Right anterior subphrenic collection measuring up to 13 x 3 cm-most likely an abscess. No clear underlying cause but a colonic source is favored given close positioning of the splenic flexure with small track like structure projecting superiorly towards the collection. No active colon inflammation is seen. 2. Small right more than left pleural effusion, likely sympathetic. 3. Chronic findings are described above. Electronically Signed   By: Monte Fantasia M.D.   On: 03/19/2020 11:14   DG Chest Port 1 View  Result Date: 03/19/2020 CLINICAL DATA:  Cough EXAM: PORTABLE CHEST 1 VIEW COMPARISON:  Oct 25, 2014. FINDINGS: There are pleural effusions, larger on the right than on the left, with bibasilar atelectasis. The lungs elsewhere are clear. The heart size and pulmonary vascularity are normal. No adenopathy. Note that there is an azygos lobe on the right, an anatomic variant. No bone lesions. IMPRESSION: Small pleural effusions bilaterally, larger on the right than on the left, with bibasilar atelectasis. Lungs elsewhere clear. Heart size normal. Electronically Signed   By: Lowella Grip III M.D.   On: 03/19/2020 07:54    Impression: I spoke with the patient's wife on the telephone regarding the pros and cons of colonoscopic evaluation.  Given the absence of evident underlying problems in the colon on his CT scan, I think the likelihood of finding a specific cause for his subphrenic abscess by doing colonoscopy would be quite low.  I think that the likelihood of finding something which would mandate additional surgical or medical treatment is even lower.  Meanwhile, I am concerned that the patient would have quite a bit of difficulty undergoing the  prep for the procedure.  Plan: The patient's wife indicates she would like "whatever is best" for her husband to be done.  I have recommended, and she is agreeable, that for now, we do not plan to put him through a colonoscopy.  Instead, we would consider that if the percutaneous drain fails to clear the abscess, or if the abscess recurs.  We will sign off at this time, but will be on standby to assist with this patient's care upon your further request.  Do not hesitate to call us if you would like to discuss his case in more detail.   LOS: 1 day   Youlanda Mighty Artis Beggs  03/20/2020, 7:30 PM   Pager [phone] If no answer or after 5 PM call [phone]

## 2020-03-20 NOTE — Progress Notes (Signed)
Subjective/Chief Complaint: Pt with no acute changes overnight Pt with no abd pain Pt scheduled for IR today   Objective: Vital signs in last 24 hours: Temp:  [98.2 F (36.8 C)-98.9 F (37.2 C)] 98.3 F (36.8 C) (10/02 0511) Pulse Rate:  [67-93] 67 (10/02 0511) Resp:  [12-24] 16 (10/02 0511) BP: (129-188)/(61-93) 188/85 (10/02 0511) SpO2:  [85 %-100 %] 96 % (10/02 0511) Weight:  [80.3 kg] 80.3 kg (10/01 1002) Last BM Date: 03/18/20  Intake/Output from previous day: 10/01 0701 - 10/02 0700 In: 1102.3 [P.O.:120; I.V.:882.3; IV Piggyback:100] Out: 102 [Urine:101; Stool:1] Intake/Output this shift: No intake/output data recorded.  PE:  Constitutional: No acute distress, conversant, appears states age. Eyes: Anicteric sclerae, moist conjunctiva, no lid lag Lungs: Clear to auscultation bilaterally, normal respiratory effort CV: regular rate and rhythm, no murmurs, no peripheral edema, pedal pulses 2+ GI: Soft, no masses or hepatosplenomegaly, non-tender to palpation Skin: No rashes, palpation reveals normal turgor Psychiatric: appropriate judgment and insight, oriented to person, place, and time   Lab Results:  Recent Labs    03/19/20 0644 03/20/20 0536  WBC 9.8 10.1  HGB 13.1 13.0  HCT 38.8* 40.3  PLT 417* 383   BMET Recent Labs    03/19/20 0641 03/20/20 0536  NA 138 134*  K 4.4 4.3  CL 103 101  CO2 24 22  GLUCOSE 111* 107*  BUN 12 12  CREATININE 1.35* 1.24  CALCIUM 8.6* 8.6*   PT/INR Recent Labs    03/19/20 1654  LABPROT 15.7*  INR 1.3*   Studies/Results: CT Head Wo Contrast  Result Date: 03/19/2020 CLINICAL DATA:  Mental status change EXAM: CT HEAD WITHOUT CONTRAST TECHNIQUE: Contiguous axial images were obtained from the base of the skull through the vertex without intravenous contrast. COMPARISON:  10/25/2014 head CT and prior. FINDINGS: Brain: No acute infarct or intracranial hemorrhage. No mass lesion. No midline shift or extra-axial fluid  collection. Panventricular dilatation is unchanged. Scattered and confluent hypodense foci involving the periventricular and subcortical white matter nonspecific however commonly associated with chronic microvascular ischemic changes. Sequela of more remote bilateral cerebellar insults. Vascular: No hyperdense vessel or unexpected calcification. Bilateral skull base atherosclerotic calcifications. Skull: Negative for fracture or focal lesion. Sinuses/Orbits: Sequela of bilateral lens replacement. Clear paranasal sinuses. No mastoid effusion. Other: None. IMPRESSION: No acute intracranial process. Remote bilateral cerebellar infarcts. Panventricular dilatation, unchanged. Consider normal pressure hydrocephalus. Chronic microvascular ischemic changes are unchanged. Electronically Signed   By: Primitivo Gauze M.D.   On: 03/19/2020 09:25   CT Abdomen Pelvis W Contrast  Result Date: 03/19/2020 CLINICAL DATA:  Acute nonlocalized abdominal pain EXAM: CT ABDOMEN AND PELVIS WITH CONTRAST TECHNIQUE: Multidetector CT imaging of the abdomen and pelvis was performed using the standard protocol following bolus administration of intravenous contrast. CONTRAST:  75mL OMNIPAQUE IOHEXOL 300 MG/ML  SOLN COMPARISON:  08/24/2019 FINDINGS: Lower chest: Small right more than left pleural effusion and lower lobe atelectasis. Coronary atherosclerosis. Hepatobiliary: No focal liver abnormality.Cholecystectomy. No bile duct dilatation. Pancreas: Unremarkable. Spleen: Unremarkable. Adrenals/Urinary Tract: Negative adrenals. No hydronephrosis or ureteral stone. Punctate right lower pole calculi. Bilateral renal cystic densities which are unchanged. Bladder wall thickening which is circumferential and likely from chronic outlet obstruction. Near calcific density along the mucosal surface anteriorly and inferiorly, also seen previously. Stomach/Bowel: No obstruction. No detected bowel wall thickening, no appendicitis. A small track like  soft tissue density structure extends superiorly from the splenic flexure towards the subphrenic collection. Vascular/Lymphatic: No acute vascular abnormality.  No mass or adenopathy. Reproductive:Symmetric enlargement of the prostate. Other: Rim enhancing fluid collection in the anterior subphrenic space, contiguous with the right liver. Collection measures 13 cm in length by 3 cm in thickness. Postoperative right groin, possibly for hernia repair. Fatty umbilical hernia. Musculoskeletal: No acute abnormalities. Asymmetric muscular atrophy involving the right hip. IMPRESSION: 1. Right anterior subphrenic collection measuring up to 13 x 3 cm-most likely an abscess. No clear underlying cause but a colonic source is favored given close positioning of the splenic flexure with small track like structure projecting superiorly towards the collection. No active colon inflammation is seen. 2. Small right more than left pleural effusion, likely sympathetic. 3. Chronic findings are described above. Electronically Signed   By: Monte Fantasia M.D.   On: 03/19/2020 11:14   DG Chest Port 1 View  Result Date: 03/19/2020 CLINICAL DATA:  Cough EXAM: PORTABLE CHEST 1 VIEW COMPARISON:  Oct 25, 2014. FINDINGS: There are pleural effusions, larger on the right than on the left, with bibasilar atelectasis. The lungs elsewhere are clear. The heart size and pulmonary vascularity are normal. No adenopathy. Note that there is an azygos lobe on the right, an anatomic variant. No bone lesions. IMPRESSION: Small pleural effusions bilaterally, larger on the right than on the left, with bibasilar atelectasis. Lungs elsewhere clear. Heart size normal. Electronically Signed   By: Lowella Grip III M.D.   On: 03/19/2020 07:54    Anti-infectives: Anti-infectives (From admission, onward)   Start     Dose/Rate Route Frequency Ordered Stop   03/19/20 2000  metroNIDAZOLE (FLAGYL) IVPB 500 mg        500 mg 100 mL/hr over 60 Minutes  Intravenous Every 8 hours 03/19/20 1812     03/19/20 2000  cefTRIAXone (ROCEPHIN) 2 g in sodium chloride 0.9 % 100 mL IVPB        2 g 200 mL/hr over 30 Minutes Intravenous Daily-1800 03/19/20 1830     03/19/20 1815  Ampicillin-Sulbactam (UNASYN) 3 g in sodium chloride 0.9 % 100 mL IVPB  Status:  Discontinued        3 g 200 mL/hr over 30 Minutes Intravenous Every 6 hours 03/19/20 1812 03/19/20 1830      Assessment/Plan: HTN - per medicine H/O nephrolithiasis - likely explains UA findings H/o CVA - on ASA DM - per medicine H/O polio  Right subphrenic abscess -PLan for IR to place drain today -Con't Abx -Mack to restart PO after IR procedure -No surgical intervention planned.   LOS: 1 day    Mike Mack 03/20/2020

## 2020-03-20 NOTE — Progress Notes (Signed)
PROGRESS NOTE  Mike Mack BDZ:[id number] DOB: [birth year]   PCP: Jani Gravel, MD  Patient is from: home.  Uses walker at baseline.  DOA: 03/19/2020 LOS: 1  Brief Narrative / Interim history: 84 year old male with history of DM-2, calculus cholecystitis status post lap chole in 08/4194, complicated by gallbladder fossa abscess requiring IR drainage and IV antibiotic in 08/2013, CKD-3A, CVA, cognitive decline, HTN, post polio syndrome and neuropathy presenting with generalized weakness, poor p.o. intake and right upper quadrant pain for about 2 weeks.  In ED, hypertensive hypertensive to 187/86.  Afebrile.  WBC 9.8. Cr 1.35.  Lipase 20.  Troponin 6. UA with large Hgb, 5 ketones, rare bacteria.  CXR with small bilateral pleural effusions and atelectasis.  CT head without acute finding.  CT abdomen and pelvis with right anterior subphrenic collection measuring up to 13 x 3 cm concerning for abscess. Per radiology, concern about colonic source given proximity.  Cultures obtained.  Patient was started on IV Rocephin and Flagyl.  General surgery and IR consulted.   Plan is for IR to drain the fluid collection  Subjective: Seen and examined earlier this morning.  No major events overnight or this morning.  Reports abdominal pain and headache but his history is not consistent.  He is not a great historian.  He thinks he is in the hospital because of his leg from poliomyelitis.  He is oriented to self and partial place.  Very limited insight into his condition.  Objective: Vitals:   03/20/20 0026 03/20/20 0511 03/20/20 0900 03/20/20 1008  BP: (!) 162/86 (!) 188/85 (!) 164/74 (!) 174/76  Pulse: 71 67 76 79  Resp: 16 16 15 16   Temp:  98.3 F (36.8 C) 98.6 F (37 C) 98.9 F (37.2 C)  TempSrc:  Oral Oral Oral  SpO2: 100% 96% 100% 99%  Weight:      Height:        Intake/Output Summary (Last 24 hours) at 03/20/2020 1209 Last data filed at 03/20/2020 1012 Gross per 24 hour  Intake 1102.33 ml   Output 702 ml  Net 400.33 ml   Filed Weights   03/19/20 0638 03/19/20 1002  Weight: 80.3 kg 80.3 kg    Examination:  GENERAL: No apparent distress.  Nontoxic. HEENT: MMM.  PERRL.  No photophobia. NECK: Supple.  No nuchal rigidity.  No apparent JVD.  RESP: On room air.  No IWOB.  Fair aeration bilaterally. CVS:  RRR. Heart sounds normal.  ABD/GI/GU: BS+. Abd soft, NTND.  MSK/EXT:  Moves extremities. No apparent deformity. No edema.  SKIN: no apparent skin lesion or wound NEURO: Awake and alert.  Oriented to self and partial place.  RLE atrophy with weakness PSYCH: Calm.  No distress or agitation.  Poor insight to his situation.  Procedures:  None  Microbiology summarized: COVID-19 PCR negative. Blood cultures NGTD Urine culture NGTD MRSA PCR negative  Assessment & Plan: Subphrenic abscess-CT abdomen and pelvis with right anterior subphrenic collection measuring up to 13 x 3 cm concerning for abscess.  Concern about colonic source given proximity.  He has history of lap chole complicated by right hepatic and gallbladder fossa abscess in 2015.  No prior colonoscopy in his chart.  Abdominal exam benign. -General surgery, [facility] and IR consulted-plan for IR drainage today. -Continue IV Rocephin and Flagyl  Uncontrolled DM-2 complicated by chronic kidney disease Recent Labs  Lab 03/19/20 2030 03/20/20 0023 03/20/20 0508 03/20/20 0757 03/20/20 1126  GLUCAP 107* 97 96 106* 99  -  Continue SSI-moderate  CKD-3A: Stable. Lab Results  Component Value Date   CREATININE 1.24 03/20/2020   CREATININE 1.35 (H) 03/19/2020   CREATININE 1.37 (H) 02/05/2020  -Continue monitoring  Debility/Generlized weakness/history of left thalamic CVA/failure to Thrive:  -PT/OT to eval and treat when appropriate. Wt Readings from Last 10 Encounters:  03/19/20 80.3 kg  12/07/19 83.9 kg  11/20/19 81.6 kg  08/24/19 86.2 kg  03/23/19 83 kg  10/03/18 83.2 kg  04/18/17 77.6 kg  12/03/14 77.5  kg  10/28/14 77.3 kg  11/08/13 57.2 kg  -Consult dietitian after IR procedure.  Cognitive impairment-likely has dementia although no formal diagnosis.  Oriented to self and partial place -Reorientation and delirium precautions.  BPH: No urinary symptoms. -Monitor urine output  Body mass index is 29.45 kg/m.        DVT prophylaxis:  SCDs Start: 03/19/20 1242  Code Status: Full code  Family Communication: Updated patient's wife at bedside. Status is: Inpatient  Remains inpatient appropriate because:Ongoing diagnostic testing needed not appropriate for outpatient work up, IV treatments appropriate due to intensity of illness or inability to take PO and Inpatient level of care appropriate due to severity of illness   Dispo: The patient is from: Home              Anticipated d/c is to: To be determined              Anticipated d/c date is: > 3 days              Patient currently is not medically stable to d/c.       Consultants:  General surgery IR  [facility]   Sch Meds:  Scheduled Meds: . brimonidine  1 drop Both Eyes BID  . Chlorhexidine Gluconate Cloth  6 each Topical Daily  . dorzolamide-timolol  1 drop Both Eyes BID  . insulin aspart  0-15 Units Subcutaneous Q4H  . latanoprost  1 drop Both Eyes QHS  . metoprolol tartrate  25 mg Oral BID  . Netarsudil Dimesylate  1 drop Both Eyes QHS   Continuous Infusions: . cefTRIAXone (ROCEPHIN)  IV 2 g (03/19/20 2038)  . lactated ringers 75 mL/hr at 03/19/20 1601  . metronidazole 500 mg (03/20/20 0510)   PRN Meds:.hydrALAZINE  Antimicrobials: Anti-infectives (From admission, onward)   Start     Dose/Rate Route Frequency Ordered Stop   03/19/20 2000  metroNIDAZOLE (FLAGYL) IVPB 500 mg        500 mg 100 mL/hr over 60 Minutes Intravenous Every 8 hours 03/19/20 1812     03/19/20 2000  cefTRIAXone (ROCEPHIN) 2 g in sodium chloride 0.9 % 100 mL IVPB        2 g 200 mL/hr over 30 Minutes Intravenous Daily-1800 03/19/20  1830     03/19/20 1815  Ampicillin-Sulbactam (UNASYN) 3 g in sodium chloride 0.9 % 100 mL IVPB  Status:  Discontinued        3 g 200 mL/hr over 30 Minutes Intravenous Every 6 hours 03/19/20 1812 03/19/20 1830       I have personally reviewed the following labs and images: CBC: Recent Labs  Lab 03/19/20 0644 03/20/20 0536  WBC 9.8 10.1  NEUTROABS 7.5  --   HGB 13.1 13.0  HCT 38.8* 40.3  MCV 92.4 92.4  PLT 417* 383   BMP &GFR Recent Labs  Lab 03/19/20 0641 03/20/20 0536  NA 138 134*  K 4.4 4.3  CL 103 101  CO2 24 22  GLUCOSE 111* 107*  BUN 12 12  CREATININE 1.35* 1.24  CALCIUM 8.6* 8.6*   Estimated Creatinine Clearance: 45.6 mL/min (by C-G formula based on SCr of 1.24 mg/dL). Liver & Pancreas: Recent Labs  Lab 03/19/20 0641 03/20/20 0536  AST 13* 15  ALT 13 14  ALKPHOS 88 86  BILITOT 0.8 1.1  PROT 7.6 7.4  ALBUMIN 2.7* 2.6*   Recent Labs  Lab 03/19/20 0715  LIPASE 20   No results for input(s): AMMONIA in the last 168 hours. Diabetic: Recent Labs    03/19/20 1849  HGBA1C 8.4*   Recent Labs  Lab 03/19/20 2030 03/20/20 0023 03/20/20 0508 03/20/20 0757 03/20/20 1126  GLUCAP 107* 97 96 106* 99   Cardiac Enzymes: No results for input(s): CKTOTAL, CKMB, CKMBINDEX, TROPONINI in the last 168 hours. No results for input(s): PROBNP in the last 8760 hours. Coagulation Profile: Recent Labs  Lab 03/19/20 1654  INR 1.3*   Thyroid Function Tests: No results for input(s): TSH, T4TOTAL, FREET4, T3FREE, THYROIDAB in the last 72 hours. Lipid Profile: No results for input(s): CHOL, HDL, LDLCALC, TRIG, CHOLHDL, LDLDIRECT in the last 72 hours. Anemia Panel: No results for input(s): VITAMINB12, FOLATE, FERRITIN, TIBC, IRON, RETICCTPCT in the last 72 hours. Urine analysis:    Component Value Date/Time   COLORURINE YELLOW 03/19/2020 0933   APPEARANCEUR CLEAR 03/19/2020 0933   LABSPEC 1.013 03/19/2020 0933   PHURINE 7.0 03/19/2020 0933   GLUCOSEU NEGATIVE  03/19/2020 0933   HGBUR LARGE (A) 03/19/2020 0933   BILIRUBINUR NEGATIVE 03/19/2020 0933   KETONESUR 5 (A) 03/19/2020 0933   PROTEINUR 100 (A) 03/19/2020 0933   UROBILINOGEN 0.2 10/25/2014 1520   NITRITE NEGATIVE 03/19/2020 0933   LEUKOCYTESUR SMALL (A) 03/19/2020 0933   Sepsis Labs: Invalid input(s): PROCALCITONIN, [place]  Microbiology: Recent Results (from the past 240 hour(s))  Respiratory Panel by RT PCR (Flu A&B, Covid) - Nasopharyngeal Swab     Status: None   Collection Time: 03/19/20  7:15 AM   Specimen: Nasopharyngeal Swab  Result Value Ref Range Status   SARS Coronavirus 2 by RT PCR NEGATIVE NEGATIVE Final    Comment: (NOTE) SARS-CoV-2 target nucleic acids are NOT DETECTED.  The SARS-CoV-2 RNA is generally detectable in upper respiratoy specimens during the acute phase of infection. The lowest concentration of SARS-CoV-2 viral copies this assay can detect is 131 copies/mL. A negative result does not preclude SARS-Cov-2 infection and should not be used as the sole basis for treatment or other patient management decisions. A negative result may occur with  improper specimen collection/handling, submission of specimen other than nasopharyngeal swab, presence of viral mutation(s) within the areas targeted by this assay, and inadequate number of viral copies (<131 copies/mL). A negative result must be combined with clinical observations, patient history, and epidemiological information. The expected result is Negative.  Fact Sheet for Patients:  PinkCheek.be  Fact Sheet for Healthcare Providers:  GravelBags.it  This test is no t yet approved or cleared by the [place] FDA and  has been authorized for detection and/or diagnosis of SARS-CoV-2 by FDA under an Emergency Use Authorization (EUA). This EUA will remain  in effect (meaning this test can be used) for the duration of the COVID-19 declaration  under Section 564(b)(1) of the Act, 21 U.S.C. section 360bbb-3(b)(1), unless the authorization is terminated or revoked sooner.     Influenza A by PCR NEGATIVE NEGATIVE Final   Influenza B by PCR NEGATIVE NEGATIVE Final    Comment: (NOTE) The  Xpert Xpress SARS-CoV-2/FLU/RSV assay is intended as an aid in  the diagnosis of influenza from Nasopharyngeal swab specimens and  should not be used as a sole basis for treatment. Nasal washings and  aspirates are unacceptable for Xpert Xpress SARS-CoV-2/FLU/RSV  testing.  Fact Sheet for Patients: PinkCheek.be  Fact Sheet for Healthcare Providers: GravelBags.it  This test is not yet approved or cleared by the [place] FDA and  has been authorized for detection and/or diagnosis of SARS-CoV-2 by  FDA under an Emergency Use Authorization (EUA). This EUA will remain  in effect (meaning this test can be used) for the duration of the  Covid-19 declaration under Section 564(b)(1) of the Act, 21  U.S.C. section 360bbb-3(b)(1), unless the authorization is  terminated or revoked. Performed at [facility], [place] [address] [place], [place] [address]   Urine culture     Status: None   Collection Time: 03/19/20  9:33 AM   Specimen: In/Out Cath Urine  Result Value Ref Range Status   Specimen Description   Final    IN/OUT CATH URINE Performed at [facility] [address], [place], [place] [address]    Special Requests   Final    NONE Performed at [facility], [place] [address], [place], [place] [address]    Culture   Final    NO GROWTH Performed at [facility], [place] [address] [place], [place] [address]    Report Status 03/20/2020 FINAL  Final  Culture, blood (routine x 2)     Status: None (Preliminary result)   Collection Time: 03/19/20 11:30 AM   Specimen: BLOOD LEFT HAND  Result Value Ref Range Status   Specimen  Description   Final    BLOOD LEFT HAND Performed at [facility] [address] [place], [place] [address]    Special Requests   Final    BOTTLES DRAWN AEROBIC AND ANAEROBIC Blood Culture adequate volume Performed at [facility] [address], [place], [place] [address]    Culture   Final    NO GROWTH < 24 HOURS Performed at [facility] [address] [place], [name] [address]    Report Status PENDING  Incomplete  Culture, blood (routine x 2)     Status: None (Preliminary result)   Collection Time: 03/19/20 11:45 AM   Specimen: BLOOD RIGHT HAND  Result Value Ref Range Status   Specimen Description   Final    BLOOD RIGHT HAND Performed at [facility] [address], [place], [place] [address]    Special Requests   Final    BOTTLES DRAWN AEROBIC AND ANAEROBIC Blood Culture adequate volume Performed at [facility] [address], [place], [place] [address]    Culture   Final    NO GROWTH < 24 HOURS Performed at [facility] [address] [place],  [address]    Report Status PENDING  Incomplete  MRSA PCR Screening     Status: None   Collection Time: 03/20/20  5:37 AM   Specimen: Nasal Mucosa; Nasopharyngeal  Result Value Ref Range Status   MRSA by PCR NEGATIVE NEGATIVE Final    Comment:        The GeneXpert MRSA Assay (FDA approved for NASAL specimens only), is one component of a comprehensive MRSA colonization surveillance program. It is not intended to diagnose MRSA infection nor to guide or monitor treatment for  MRSA infections. Performed at [facility], [place] [address], [place], [place] [address]     Radiology Studies: No results found.    Demetrius Mahler T. [place]  If 7PM-7AM, please contact night-coverage [email or web address] 03/20/2020, 12:09 PM

## 2020-03-21 DIAGNOSIS — E113553 Type 2 diabetes mellitus with stable proliferative diabetic retinopathy, bilateral: Secondary | ICD-10-CM | POA: Diagnosis not present

## 2020-03-21 DIAGNOSIS — K651 Peritoneal abscess: Secondary | ICD-10-CM | POA: Diagnosis not present

## 2020-03-21 DIAGNOSIS — G9341 Metabolic encephalopathy: Secondary | ICD-10-CM

## 2020-03-21 DIAGNOSIS — R5381 Other malaise: Secondary | ICD-10-CM | POA: Diagnosis not present

## 2020-03-21 LAB — GLUCOSE, CAPILLARY
Glucose-Capillary: 116 mg/dL — ABNORMAL HIGH (ref 70–99)
Glucose-Capillary: 139 mg/dL — ABNORMAL HIGH (ref 70–99)
Glucose-Capillary: 154 mg/dL — ABNORMAL HIGH (ref 70–99)
Glucose-Capillary: 232 mg/dL — ABNORMAL HIGH (ref 70–99)
Glucose-Capillary: 183 mg/dL — ABNORMAL HIGH (ref 70–99)

## 2020-03-21 LAB — TSH: TSH: 1.432 u[IU]/mL (ref 0.350–4.500)

## 2020-03-21 LAB — AMMONIA: Ammonia: 16 umol/L (ref 9–35)

## 2020-03-21 MED ORDER — GABAPENTIN 300 MG PO CAPS
300.0000 mg | ORAL_CAPSULE | Freq: Two times a day (BID) | ORAL | Status: DC
  Administered 2020-03-21: 300 mg via ORAL
  Filled 2020-03-21: qty 1

## 2020-03-21 MED ORDER — SODIUM CHLORIDE 0.9 % IV SOLN
INTRAVENOUS | Status: DC | PRN
  Administered 2020-03-21: 250 mL via INTRAVENOUS

## 2020-03-21 MED ORDER — SODIUM CHLORIDE 0.9 % IV SOLN: INTRAVENOUS | Status: DC

## 2020-03-21 MED ORDER — ADULT MULTIVITAMIN W/MINERALS CH
1.0000 | ORAL_TABLET | Freq: Every day | ORAL | Status: DC
  Administered 2020-03-21 – 2020-03-25 (×5): 1 via ORAL
  Filled 2020-03-21 (×5): qty 1

## 2020-03-21 MED ORDER — IRBESARTAN 150 MG PO TABS
150.0000 mg | ORAL_TABLET | Freq: Every day | ORAL | Status: DC
  Administered 2020-03-21 – 2020-03-25 (×5): 150 mg via ORAL
  Filled 2020-03-21 (×5): qty 1

## 2020-03-21 MED ORDER — GABAPENTIN 100 MG PO CAPS
100.0000 mg | ORAL_CAPSULE | Freq: Two times a day (BID) | ORAL | Status: DC
  Administered 2020-03-21 – 2020-03-25 (×8): 100 mg via ORAL
  Filled 2020-03-21 (×8): qty 1

## 2020-03-21 MED ORDER — ATORVASTATIN CALCIUM 20 MG PO TABS
20.0000 mg | ORAL_TABLET | Freq: Every evening | ORAL | Status: DC
  Administered 2020-03-21 – 2020-03-24 (×4): 20 mg via ORAL
  Filled 2020-03-21 (×4): qty 1

## 2020-03-21 MED ORDER — TAMSULOSIN HCL 0.4 MG PO CAPS
0.4000 mg | ORAL_CAPSULE | Freq: Two times a day (BID) | ORAL | Status: DC
  Administered 2020-03-21 – 2020-03-25 (×9): 0.4 mg via ORAL
  Filled 2020-03-21 (×10): qty 1

## 2020-03-21 MED ORDER — LEVOTHYROXINE SODIUM 88 MCG PO TABS
88.0000 ug | ORAL_TABLET | Freq: Every day | ORAL | Status: DC
  Administered 2020-03-21 – 2020-03-25 (×5): 88 ug via ORAL
  Filled 2020-03-21 (×5): qty 1

## 2020-03-21 MED ORDER — ASPIRIN EC 81 MG PO TBEC
81.0000 mg | DELAYED_RELEASE_TABLET | Freq: Every day | ORAL | Status: DC
  Administered 2020-03-21 – 2020-03-25 (×5): 81 mg via ORAL
  Filled 2020-03-21 (×5): qty 1

## 2020-03-21 NOTE — Plan of Care (Signed)
  Problem: Safety: Goal: Ability to remain free from injury will improve Outcome: Progressing   Problem: Skin Integrity: Goal: Risk for impaired skin integrity will decrease Outcome: Progressing   

## 2020-03-21 NOTE — Evaluation (Signed)
Physical Therapy Evaluation Patient Details Name: Mike Mack MRN: [id number] DOB: [birth year] Today's Date: 03/21/2020   History of Present Illness  84 yo male admitted with R upper quadrant pain and decreased PO intake. CT (+) 13 cm subphrenic abscess contiguity with R lobe of liver and hepatic flexure of the colon. 9/02 IR drained abscess PMH remote hx of subhepatic abscess s/p laparoscopic cholecystecomy dementia arthritis R leg kidney stones, stomach ulcers polio HTN SBO CVA residual L hand numbness DM2  Clinical Impression  Pt admitted with abscess to R liver (drained by IR 03/20/20) and hepatic flexure of the colon. Pt currently with functional limitiations due to the deficits listed below (see PT problem list). Pt currently mod (A) for bed mobility and total +2 mod (A) from elevated surface for sit<>Stand.  Pt was unable to progress further than standing at  EOB due to inability to initiate stepping or weight shifting. Pt will benefit from skilled PT to increase  independence and safety with mobility and balance to allow discharge SNF. Uncertain of family ability to (A) at baseline so recommendation for higher level of care.     Follow Up Recommendations SNF    Equipment Recommendations  None recommended by PT    Recommendations for Other Services       Precautions / Restrictions Precautions Precautions: Fall Precaution Comments: JP drain R flank Restrictions Weight Bearing Restrictions: No      Mobility  Bed Mobility Overal bed mobility: Needs Assistance Bed Mobility: Supine to Sit;Sit to Supine     Supine to sit: Max assist Sit to supine: +2 for physical assistance;Max assist   General bed mobility comments: pt does not follow commands to sequence task. pt dependent on therapist to direct the task. Pt needs (A) to elevate trunk from surface. pt with total+2 total to position toward [facility] with pad this session  Transfers Overall transfer level: Needs  assistance Equipment used: Rolling walker (2 wheeled) Transfers: Sit to/from Stand Sit to Stand: From elevated surface;+2 physical assistance;Mod assist         General transfer comment: x2 attempts. pt able to clear bed surface but unable to side step toward HOB. pt requires return to sitting after first attempt in less than 1 minute.   Ambulation/Gait             General Gait Details: Pt stood x 2 with RW but unable to initiate step with either LE  Stairs            Wheelchair Mobility    Modified Rankin (Stroke Patients Only)       Balance Overall balance assessment: Needs assistance Sitting-balance support: Bilateral upper extremity supported;Feet supported Sitting balance-Leahy Scale: Poor     Standing balance support: Bilateral upper extremity supported;During functional activity Standing balance-Leahy Scale: Poor                               Pertinent Vitals/Pain Pain Assessment: Faces Faces Pain Scale: Hurts whole lot Pain Location: head and right side reported Pain Descriptors / Indicators: Headache;Grimacing Pain Intervention(s): Limited activity within patient's tolerance;Monitored during session;Repositioned    Home Living Family/patient expects to be discharged to:: Skilled nursing facility Living Arrangements: Spouse/significant other               Additional Comments: no family present and pt unable to provide details    Prior Function Level of Independence: Needs assistance  Comments: Pt unable to provide information     Hand Dominance   Dominant Hand: Right    Extremity/Trunk Assessment   Upper Extremity Assessment Upper Extremity Assessment: Overall WFL for tasks assessed    Lower Extremity Assessment Lower Extremity Assessment: Difficult to assess due to impaired cognition    Cervical / Trunk Assessment Cervical / Trunk Assessment: Kyphotic  Communication   Communication: No difficulties   Cognition Arousal/Alertness: Awake/alert Behavior During Therapy: Flat affect Overall Cognitive Status: History of cognitive impairments - at baseline                                 General Comments: pt oriented to self only this session. pt with delayed response to questions regarding pain. pt reports head x2 and R side x1      General Comments      Exercises     Assessment/Plan    PT Assessment Patient needs continued PT services  PT Problem List Decreased activity tolerance;Decreased balance;Decreased mobility;Decreased cognition;Decreased knowledge of use of DME       PT Treatment Interventions DME instruction;Gait training;Stair training;Functional mobility training;Therapeutic activities;Therapeutic exercise;Balance training;Patient/family education    PT Goals (Current goals can be found in the Care Plan section)  Acute Rehab PT Goals Patient Stated Goal: none stated PT Goal Formulation: Patient unable to participate in goal setting Time For Goal Achievement: 04/04/20 Potential to Achieve Goals: Fair    Frequency Min 3X/week   Barriers to discharge        Co-evaluation PT/OT/SLP Co-Evaluation/Treatment: Yes Reason for Co-Treatment: Necessary to address cognition/behavior during functional activity;For patient/therapist safety PT goals addressed during session: Mobility/safety with mobility OT goals addressed during session: ADL's and self-care       AM-PAC PT "6 Clicks" Mobility  Outcome Measure Help needed turning from your back to your side while in a flat bed without using bedrails?: A Lot Help needed moving from lying on your back to sitting on the side of a flat bed without using bedrails?: A Lot Help needed moving to and from a bed to a chair (including a wheelchair)?: A Lot Help needed standing up from a chair using your arms (e.g., wheelchair or bedside chair)?: A Lot Help needed to walk in hospital room?: Total Help needed climbing  3-5 steps with a railing? : Total 6 Click Score: 10    End of Session Equipment Utilized During Treatment: Gait belt Activity Tolerance: Patient limited by fatigue Patient left: in bed;with call bell/phone within reach;with bed alarm set Nurse Communication: Mobility status PT Visit Diagnosis: Unsteadiness on feet (R26.81);Difficulty in walking, not elsewhere classified (R26.2)    Time: 9371-6967 PT Time Calculation (min) (ACUTE ONLY): 21 min   Charges:   PT Evaluation $PT Eval Moderate Complexity: [place] Pager [phone] Office [phone]   Collen Hostler 03/21/2020, 1:11 PM

## 2020-03-21 NOTE — Evaluation (Signed)
Occupational Therapy Evaluation Patient Details Name: Mike Mack MRN: [id number] DOB: Jan 14, 1936 Today's Date: 03/21/2020    History of Present Illness 84 yo male admitted with R upper quadrant pain and decreased PO intake. CT (+) 13 cm subphrenic abscess contiguity with R lobe of liver and hepatic flexure of the colon. 9/02 IR drained abscess PMH remote hx of subhepatic abscess s/p laparoscopic cholecystecomy dementia arthritis R leg kidney stones, stomach ulcers polio HTN SBO CVA residual L hand numbness DM2   Clinical Impression   PT admitted with abscess to R liver and hepatic flexure of the colon with IR drainage. Pt currently with functional limitiations due to the deficits listed below (see OT problem list). Pt currently mod (A) for bed mobility and total +2 mod (A) from elevated surface for sit<>Stand.Pt was unable to progress further than EOB due to does not initiate stepping or weight shifting. Pt will benefit from skilled OT to increase their independence and safety with adls and balance to allow discharge SNF. Uncertain of family ability to (A) at baseline so recommendation for higher level of care.      Follow Up Recommendations  SNF    Equipment Recommendations  Wheelchair cushion (measurements OT);Wheelchair (measurements OT);Hospital bed    Recommendations for Other Services       Precautions / Restrictions Precautions Precautions: Fall Precaution Comments: JP drain R flank      Mobility Bed Mobility Overal bed mobility: Needs Assistance Bed Mobility: Supine to Sit;Sit to Supine     Supine to sit: Max assist Sit to supine: +2 for physical assistance;Max assist   General bed mobility comments: pt does not follow commands to sequence task. pt dependent on therapist to direct the task. Pt needs (A) to elevate trunk from surface. pt with total+2 total to position toward [facility] with pad this session  Transfers Overall transfer level: Needs assistance Equipment  used: Rolling walker (2 wheeled) Transfers: Sit to/from Stand Sit to Stand: From elevated surface;+2 physical assistance;Mod assist         General transfer comment: x2 attempts. pt able to clear bed surface but unable to side step toward HOB. pt requires return to sitting after first attempt in less than 1 minute.     Balance Overall balance assessment: Needs assistance Sitting-balance support: Bilateral upper extremity supported;Feet supported Sitting balance-Leahy Scale: Poor     Standing balance support: Bilateral upper extremity supported;During functional activity Standing balance-Leahy Scale: Poor                             ADL either performed or assessed with clinical judgement   ADL Overall ADL's : Needs assistance/impaired Eating/Feeding: Minimal assistance;Bed level Eating/Feeding Details (indicate cue type and reason): spilling cup with straw due to tilting the cup Grooming: Maximal assistance;Bed level   Upper Body Bathing: Total assistance   Lower Body Bathing: Total assistance   Upper Body Dressing : Total assistance   Lower Body Dressing: Total assistance Lower Body Dressing Details (indicate cue type and reason): does not attempt to (A) with sock don               General ADL Comments: attempting sit<>Stand EOB and unable to progress      Vision Patient Visual Report: Other (comment) Additional Comments: noted to keep eyes closed the entire session with only opening them x1 with mod cues from therapist. pt noted to have x3 eye drops on RN work station. pt  with drainage noted from L eye but able to open     Perception     Praxis      Pertinent Vitals/Pain Pain Assessment: Faces Faces Pain Scale: Hurts whole lot Pain Location: head and right side reported Pain Descriptors / Indicators: Headache;Grimacing Pain Intervention(s): Monitored during session;Repositioned     Hand Dominance Right   Extremity/Trunk Assessment Upper  Extremity Assessment Upper Extremity Assessment: Overall WFL for tasks assessed   Lower Extremity Assessment Lower Extremity Assessment: Defer to PT evaluation;RLE deficits/detail RLE Deficits / Details: shifting weight toward L LE. pt does not actively attempting to step with R LE with weight shift. pt does perform knee extension at EOB with cues   Cervical / Trunk Assessment Cervical / Trunk Assessment: Kyphotic   Communication Communication Communication: No difficulties   Cognition Arousal/Alertness: Awake/alert Behavior During Therapy: Flat affect Overall Cognitive Status: History of cognitive impairments - at baseline                                 General Comments: pt oriented to self only this session. pt with delayed response to questions regarding pain. pt reports head x2 and R side x1   General Comments  will requires repositioning every 2 hrs to prevent skin break down due to decrease ability to reposition himself    Exercises     Shoulder Instructions      Home Living Family/patient expects to be discharged to:: Skilled nursing facility                                 Additional Comments: no family present and pt unable to provide details      Prior Functioning/Environment Level of Independence: Needs assistance                 OT Problem List: Decreased strength;Decreased activity tolerance;Impaired balance (sitting and/or standing);Decreased coordination;Decreased cognition;Decreased safety awareness;Decreased knowledge of use of DME or AE;Decreased knowledge of precautions;Obesity;Pain      OT Treatment/Interventions: Self-care/ADL training;Therapeutic exercise;Energy conservation;DME and/or AE instruction;Manual therapy;Therapeutic activities;Cognitive remediation/compensation;Patient/family education;Balance training;Visual/perceptual remediation/compensation    OT Goals(Current goals can be found in the care plan  section) Acute Rehab OT Goals Patient Stated Goal: none stated OT Goal Formulation: Patient unable to participate in goal setting Time For Goal Achievement: 04/04/20 Potential to Achieve Goals: Good  OT Frequency: Min 2X/week   Barriers to D/C: Other (comment) (unknown family support level)          Co-evaluation PT/OT/SLP Co-Evaluation/Treatment: Yes Reason for Co-Treatment: Necessary to address cognition/behavior during functional activity;To address functional/ADL transfers;For patient/therapist safety   OT goals addressed during session: ADL's and self-care;Strengthening/ROM      AM-PAC OT "6 Clicks" Daily Activity     Outcome Measure Help from another person eating meals?: A Lot Help from another person taking care of personal grooming?: A Lot Help from another person toileting, which includes using toliet, bedpan, or urinal?: Total Help from another person bathing (including washing, rinsing, drying)?: Total Help from another person to put on and taking off regular upper body clothing?: A Lot Help from another person to put on and taking off regular lower body clothing?: Total 6 Click Score: 9   End of Session Equipment Utilized During Treatment: Gait belt;Rolling walker Nurse Communication: Mobility status;Precautions  Activity Tolerance: Patient tolerated treatment well Patient left: in bed;with call  bell/phone within reach;with bed alarm set  OT Visit Diagnosis: Unsteadiness on feet (R26.81);Muscle weakness (generalized) (M62.81);Pain Pain - Right/Left: Right                Time: 8628-2417 OT Time Calculation (min): 20 min Charges:  OT General Charges $OT Visit: 1 Visit OT Evaluation $OT Eval Moderate Complexity: 1 Mod   Brynn, OTR/L  Acute Rehabilitation Services Pager: [phone] Office: [phone] .   Jeri Modena 03/21/2020, 9:59 AM

## 2020-03-21 NOTE — Progress Notes (Signed)
PROGRESS NOTE  Mike Mack IEP:[id number] DOB: 1935-07-31   PCP: Jani Gravel, MD  Patient is from: home.  Uses walker at baseline.  DOA: 03/19/2020 LOS: 2  Brief Narrative / Interim history: 83 year old male with history of DM-2, calculus cholecystitis status post lap chole in 11/6061, complicated by gallbladder fossa abscess requiring IR drainage and IV antibiotic in 08/2013, CKD-3A, CVA, cognitive decline, HTN, post polio syndrome and neuropathy presenting with generalized weakness, poor p.o. intake and right upper quadrant pain for about 2 weeks.  In ED, hypertensive hypertensive to 187/86.  Afebrile.  WBC 9.8. Cr 1.35.  Lipase 20.  Troponin 6. UA with large Hgb, 5 ketones, rare bacteria.  CXR with small bilateral pleural effusions and atelectasis.  CT head without acute finding.  CT abdomen and pelvis with right anterior subphrenic collection measuring up to 13 x 3 cm concerning for abscess. Per radiology, concern about colonic source given proximity.  Cultures obtained.  Patient was started on IV Rocephin and Flagyl.  General surgery, GI and IR consulted.   Patient had drain placement by IR on 03/20/2020.  Surgical culture pending.  Subjective: Seen and examined earlier this morning.  No major events overnight of this morning.  Is somewhat sleepy this morning.  Wakes to voice.  Oriented to self.  Not a reliable historian due to mental status.  Objective: Vitals:   03/20/20 2340 03/21/20 0151 03/21/20 0514 03/21/20 1155  BP: (!) 171/78 (!) 148/83 (!) 189/78 (!) 149/60  Pulse:  75 93 88  Resp:   18 18  Temp:   97.7 F (36.5 C) 98.3 F (36.8 C)  TempSrc:    Oral  SpO2:   95% 95%  Weight:      Height:        Intake/Output Summary (Last 24 hours) at 03/21/2020 1321 Last data filed at 03/21/2020 1300 Gross per 24 hour  Intake 1186.57 ml  Output 1090 ml  Net 96.57 ml   Filed Weights   03/19/20 0638 03/19/20 1002  Weight: 80.3 kg 80.3 kg    Examination:  GENERAL: No  apparent distress.  Nontoxic. HEENT: MMM.  Vision and hearing grossly intact.  NECK: Supple.  No apparent JVD.  RESP:  No IWOB.  Fair aeration bilaterally. CVS:  RRR. Heart sounds normal.  ABD/GI/GU: BS+. Abd soft, NTND.  JP drain to RUQ with small serosanguineous fluid MSK/EXT:  Moves extremities.  RLE atrophy and weakness from polio SKIN: no apparent skin lesion or wound NEURO: Sleepy but wakes to voice.  Oriented to self.  No apparent focal neuro deficit but limited exam. PSYCH: Calm.  No distress or agitation.  Procedures:  03/20/2020-drain placement by IR  Microbiology summarized: COVID-19 PCR negative. Blood cultures NGTD Urine culture NGTD MRSA PCR negative Surgical culture pending.  Assessment & Plan: Subphrenic abscess-CT abdomen and pelvis with right anterior subphrenic collection measuring up to 13 x 3 cm concerning for abscess. He has history of lap chole complicated by right hepatic and gallbladder fossa abscess in 2015.  -General surgery, [facility] and IR consulted-plan for IR drainage today. -Drain placement by IR on 03/20/2020 -Appreciate GI input-no colonoscopy at this time -Appreciate general surgery -Continue IV Rocephin and Flagyl -Follow drain culture  Uncontrolled DM-2 complicated by chronic kidney disease Recent Labs  Lab 03/20/20 1957 03/20/20 2340 03/21/20 0404 03/21/20 0743 03/21/20 1132  GLUCAP 115* 133* 116* 139* 154*  -Continue SSI-moderate  CKD-3A: Stable. Lab Results  Component Value Date   CREATININE 1.24 03/20/2020  CREATININE 1.35 (H) 03/19/2020   CREATININE 1.37 (H) 02/05/2020  -Continue monitoring  Debility/Generlized weakness/history of left thalamic CVA/failure to Thrive:  -PT/OT to eval and treat when appropriate. Wt Readings from Last 10 Encounters:  03/19/20 80.3 kg  12/07/19 83.9 kg  11/20/19 81.6 kg  08/24/19 86.2 kg  03/23/19 83 kg  10/03/18 83.2 kg  04/18/17 77.6 kg  12/03/14 77.5 kg  10/28/14 77.3 kg  11/08/13  57.2 kg  -Consult dietitian  Acute metabolic encephalopathy in patient with dementia: Sleepier today but wakes to voice.  Only oriented to self. -Reorientation and delirium precautions. -Minimize or avoid sedating medications  BPH: No urinary symptoms. -Monitor urine output  Body mass index is 29.45 kg/m.        DVT prophylaxis:  SCDs Start: 03/19/20 1242  Code Status: Full code  Family Communication: Updated patient's wife at bedside on 10/3. Status is: Inpatient  Remains inpatient appropriate because:Altered mental status, Ongoing diagnostic testing needed not appropriate for outpatient work up, IV treatments appropriate due to intensity of illness or inability to take PO and Inpatient level of care appropriate due to severity of illness   Dispo: The patient is from: Home              Anticipated d/c is to: SNF              Anticipated d/c date is: > 3 days              Patient currently is not medically stable to d/c.       Consultants:  General surgery IR  [facility]   Sch Meds:  Scheduled Meds: . aspirin EC  81 mg Oral Daily  . atorvastatin  20 mg Oral QPM  . brimonidine  1 drop Both Eyes BID  . Chlorhexidine Gluconate Cloth  6 each Topical Daily  . dorzolamide-timolol  1 drop Both Eyes BID  . gabapentin  300 mg Oral BID  . insulin aspart  0-15 Units Subcutaneous Q4H  . irbesartan  150 mg Oral Daily  . latanoprost  1 drop Both Eyes QHS  . levothyroxine  88 mcg Oral Q0600  . metoprolol tartrate  25 mg Oral BID  . multivitamin with minerals  1 tablet Oral Daily  . Netarsudil Dimesylate  1 drop Both Eyes QHS  . sodium chloride flush  5 mL Intracatheter Q8H  . tamsulosin  0.4 mg Oral BID   Continuous Infusions: . cefTRIAXone (ROCEPHIN)  IV 2 g (03/20/20 1745)  . metronidazole 500 mg (03/21/20 0648)   PRN Meds:.hydrALAZINE, HYDROcodone-acetaminophen  Antimicrobials: Anti-infectives (From admission, onward)   Start     Dose/Rate Route Frequency  Ordered Stop   03/19/20 2000  metroNIDAZOLE (FLAGYL) IVPB 500 mg        500 mg 100 mL/hr over 60 Minutes Intravenous Every 8 hours 03/19/20 1812     03/19/20 2000  cefTRIAXone (ROCEPHIN) 2 g in sodium chloride 0.9 % 100 mL IVPB        2 g 200 mL/hr over 30 Minutes Intravenous Daily-1800 03/19/20 1830     03/19/20 1815  Ampicillin-Sulbactam (UNASYN) 3 g in sodium chloride 0.9 % 100 mL IVPB  Status:  Discontinued        3 g 200 mL/hr over 30 Minutes Intravenous Every 6 hours 03/19/20 1812 03/19/20 1830       I have personally reviewed the following labs and images: CBC: Recent Labs  Lab 03/19/20 0644 03/20/20 0536  WBC 9.8  10.1  NEUTROABS 7.5  --   HGB 13.1 13.0  HCT 38.8* 40.3  MCV 92.4 92.4  PLT 417* 383   BMP &GFR Recent Labs  Lab 03/19/20 0641 03/20/20 0536  NA 138 134*  K 4.4 4.3  CL 103 101  CO2 24 22  GLUCOSE 111* 107*  BUN 12 12  CREATININE 1.35* 1.24  CALCIUM 8.6* 8.6*   Estimated Creatinine Clearance: 45.6 mL/min (by C-G formula based on SCr of 1.24 mg/dL). Liver & Pancreas: Recent Labs  Lab 03/19/20 0641 03/20/20 0536  AST 13* 15  ALT 13 14  ALKPHOS 88 86  BILITOT 0.8 1.1  PROT 7.6 7.4  ALBUMIN 2.7* 2.6*   Recent Labs  Lab 03/19/20 0715  LIPASE 20   No results for input(s): AMMONIA in the last 168 hours. Diabetic: Recent Labs    03/19/20 1849  HGBA1C 8.4*   Recent Labs  Lab 03/20/20 1957 03/20/20 2340 03/21/20 0404 03/21/20 0743 03/21/20 1132  GLUCAP 115* 133* 116* 139* 154*   Cardiac Enzymes: No results for input(s): CKTOTAL, CKMB, CKMBINDEX, TROPONINI in the last 168 hours. No results for input(s): PROBNP in the last 8760 hours. Coagulation Profile: Recent Labs  Lab 03/19/20 1654  INR 1.3*   Thyroid Function Tests: No results for input(s): TSH, T4TOTAL, FREET4, T3FREE, THYROIDAB in the last 72 hours. Lipid Profile: No results for input(s): CHOL, HDL, LDLCALC, TRIG, CHOLHDL, LDLDIRECT in the last 72 hours. Anemia  Panel: No results for input(s): VITAMINB12, FOLATE, FERRITIN, TIBC, IRON, RETICCTPCT in the last 72 hours. Urine analysis:    Component Value Date/Time   COLORURINE YELLOW 03/19/2020 0933   APPEARANCEUR CLEAR 03/19/2020 0933   LABSPEC 1.013 03/19/2020 0933   PHURINE 7.0 03/19/2020 0933   GLUCOSEU NEGATIVE 03/19/2020 0933   HGBUR LARGE (A) 03/19/2020 0933   BILIRUBINUR NEGATIVE 03/19/2020 0933   KETONESUR 5 (A) 03/19/2020 0933   PROTEINUR 100 (A) 03/19/2020 0933   UROBILINOGEN 0.2 10/25/2014 1520   NITRITE NEGATIVE 03/19/2020 0933   LEUKOCYTESUR SMALL (A) 03/19/2020 0933   Sepsis Labs: Invalid input(s): PROCALCITONIN, [place]  Microbiology: Recent Results (from the past 240 hour(s))  Respiratory Panel by RT PCR (Flu A&B, Covid) - Nasopharyngeal Swab     Status: None   Collection Time: 03/19/20  7:15 AM   Specimen: Nasopharyngeal Swab  Result Value Ref Range Status   SARS Coronavirus 2 by RT PCR NEGATIVE NEGATIVE Final    Comment: (NOTE) SARS-CoV-2 target nucleic acids are NOT DETECTED.  The SARS-CoV-2 RNA is generally detectable in upper respiratoy specimens during the acute phase of infection. The lowest concentration of SARS-CoV-2 viral copies this assay can detect is 131 copies/mL. A negative result does not preclude SARS-Cov-2 infection and should not be used as the sole basis for treatment or other patient management decisions. A negative result may occur with  improper specimen collection/handling, submission of specimen other than nasopharyngeal swab, presence of viral mutation(s) within the areas targeted by this assay, and inadequate number of viral copies (<131 copies/mL). A negative result must be combined with clinical observations, patient history, and epidemiological information. The expected result is Negative.  Fact Sheet for Patients:  PinkCheek.be  Fact Sheet for Healthcare Providers:   GravelBags.it  This test is no t yet approved or cleared by the [place] FDA and  has been authorized for detection and/or diagnosis of SARS-CoV-2 by FDA under an Emergency Use Authorization (EUA). This EUA will remain  in effect (meaning this test can be  used) for the duration of the COVID-19 declaration under Section 564(b)(1) of the Act, 21 U.S.C. section 360bbb-3(b)(1), unless the authorization is terminated or revoked sooner.     Influenza A by PCR NEGATIVE NEGATIVE Final   Influenza B by PCR NEGATIVE NEGATIVE Final    Comment: (NOTE) The Xpert Xpress SARS-CoV-2/FLU/RSV assay is intended as an aid in  the diagnosis of influenza from Nasopharyngeal swab specimens and  should not be used as a sole basis for treatment. Nasal washings and  aspirates are unacceptable for Xpert Xpress SARS-CoV-2/FLU/RSV  testing.  Fact Sheet for Patients: PinkCheek.be  Fact Sheet for Healthcare Providers: GravelBags.it  This test is not yet approved or cleared by the [place] FDA and  has been authorized for detection and/or diagnosis of SARS-CoV-2 by  FDA under an Emergency Use Authorization (EUA). This EUA will remain  in effect (meaning this test can be used) for the duration of the  Covid-19 declaration under Section 564(b)(1) of the Act, 21  U.S.C. section 360bbb-3(b)(1), unless the authorization is  terminated or revoked. Performed at [facility], [place] [address], [place], [place] [address]   Urine culture     Status: None   Collection Time: 03/19/20  9:33 AM   Specimen: In/Out Cath Urine  Result Value Ref Range Status   Specimen Description   Final    IN/OUT CATH URINE Performed at [facility] [address], [place], [place] [address]    Special Requests   Final    NONE Performed at [facility], [place] [address],  [place], [place] [address]    Culture   Final    NO GROWTH Performed at [facility], [place] [address] [place], [place] [address]    Report Status 03/20/2020 FINAL  Final  Culture, blood (routine x 2)     Status: None (Preliminary result)   Collection Time: 03/19/20 11:30 AM   Specimen: BLOOD LEFT HAND  Result Value Ref Range Status   Specimen Description   Final    BLOOD LEFT HAND Performed at [facility] [address], [place], [place] [address]    Special Requests   Final    BOTTLES DRAWN AEROBIC AND ANAEROBIC Blood Culture adequate volume Performed at [facility] [address] [place], [place] [address]    Culture   Final    NO GROWTH 2 DAYS Performed at [facility] [address] [place], [place] [address]    Report Status PENDING  Incomplete  Culture, blood (routine x 2)     Status: None (Preliminary result)   Collection Time: 03/19/20 11:45 AM   Specimen: BLOOD RIGHT HAND  Result Value Ref Range Status   Specimen Description   Final    BLOOD RIGHT HAND Performed at [facility] [address] [place], [place] [address]    Special Requests   Final    BOTTLES DRAWN AEROBIC AND ANAEROBIC Blood Culture adequate volume Performed at [facility] [address] [place], [place] [address]    Culture   Final    NO GROWTH 2 DAYS Performed at [facility] [address] [place], [place] [address]    Report Status PENDING  Incomplete  MRSA PCR Screening     Status: None   Collection Time: 03/20/20  5:37 AM   Specimen: Nasal Mucosa; Nasopharyngeal  Result Value Ref Range Status   MRSA by  PCR NEGATIVE NEGATIVE Final    Comment:        The GeneXpert MRSA Assay (FDA approved for NASAL specimens only), is one component of a comprehensive MRSA colonization surveillance program. It is not intended to diagnose MRSA infection nor to guide or monitor treatment for MRSA  infections. Performed at [facility], [place] [address], [place], [place] [address]   Aerobic/Anaerobic Culture (surgical/deep wound)     Status: None (Preliminary result)   Collection Time: 03/20/20  4:09 PM   Specimen: Abscess  Result Value Ref Range Status   Specimen Description   Final    ABSCESS Performed at [facility] [address], [place], [place] [address]    Special Requests   Final    ABDOMEN Performed at [facility], [place] [address] [place], [place] [address]    Gram Stain PENDING  Incomplete   Culture   Final    TOO YOUNG TO READ Performed at [facility], [place] [address] [place], [place] [address]    Report Status PENDING  Incomplete    Radiology Studies: CT IMAGE GUIDED DRAINAGE BY PERCUTANEOUS CATHETER  Result Date: 03/21/2020 INDICATION: 84 year old male with a history subphrenic abscess EXAM: CT GUIDED DRAINAGE OF  ABSCESS MEDICATIONS: The patient is currently admitted to the hospital and receiving intravenous antibiotics. The antibiotics were administered within an appropriate time frame prior to the initiation of the procedure. ANESTHESIA/SEDATION: 0.25 mg IV Versed 50 mcg IV Fentanyl Moderate Sedation Time:  10 minutes The patient was continuously monitored during the procedure by the interventional radiology nurse under my direct supervision. COMPLICATIONS: None TECHNIQUE: Informed written consent was obtained from the patient after a thorough discussion of the procedural risks, benefits and alternatives. All questions were addressed. Maximal Sterile Barrier Technique was utilized including caps, mask, sterile gowns, sterile gloves, sterile drape, hand hygiene and skin antiseptic. A timeout was performed prior to the initiation of the procedure. PROCEDURE: The operative field was prepped with Chlorhexidine in a sterile fashion, and a sterile drape was applied covering the operative field. A sterile gown  and sterile gloves were used for the procedure. Local anesthesia was provided with 1% Lidocaine. Patient was positioned supine position on CT gantry table. Scout CT was acquired for planning purposes. The patient was then prepped and draped in the usual sterile fashion. 1% lidocaine was used for local anesthesia. Using CT guidance, trocar needle was advanced into the right upper quadrant abscess. Once we aspirated purulent material, modified Seldinger technique was used to place a 10 [place] drain. Proximally 50 cc of purulent material aspirated through the pigtail catheter. Ten [place] drain was sutured in place and attached to bulb suction. Final CT image was acquired. Patient tolerated the procedure well and remained hemodynamically stable throughout. No complications were encountered and no significant blood loss. FINDINGS: Right subphrenic abscess, similar to the comparison. Pigtail drainage catheter into the collection with approximately 50 cc of purulent material aspirated. IMPRESSION: Status post CT-guided drainage of right upper quadrant abscess. Signed, Dulcy Fanny. Dellia Nims, RPVI [facility] Radiology Electronically Signed   By: Corrie Mckusick D.O.   On: 03/21/2020 04:55      Nathanael Krist T. [place]  If 7PM-7AM, please contact night-coverage [email or web address] 03/21/2020, 1:21 PM

## 2020-03-21 NOTE — Progress Notes (Signed)
Subjective/Chief Complaint: Pt with drain placed No acute events overnight   Objective: Vital signs in last 24 hours: Temp:  [97.7 F (36.5 C)-99.2 F (37.3 C)] 97.7 F (36.5 C) (10/03 0514) Pulse Rate:  [65-93] 93 (10/03 0514) Resp:  [15-18] 18 (10/03 0514) BP: (148-198)/(63-94) 189/78 (10/03 0514) SpO2:  [94 %-100 %] 95 % (10/03 0514) Last BM Date: 03/20/20  Intake/Output from previous day: 10/02 0701 - 10/03 0700 In: 1066.6 [P.O.:90; I.V.:567.7; IV Piggyback:408.9] Out: 1690 [Urine:1650; Drains:40] Intake/Output this shift: No intake/output data recorded.  PE:  Constitutional: No acute distress, conversant, appears states age. Eyes: Anicteric sclerae, moist conjunctiva, no lid lag Lungs: Clear to auscultation bilaterally, normal respiratory effort CV: regular rate and rhythm, no murmurs, no peripheral edema, pedal pulses 2+ GI: Soft, no masses or hepatosplenomegaly, non-tender to palpation, JP SS Skin: No rashes, palpation reveals normal turgor Psychiatric: appropriate judgment and insight, oriented to person, place, and time   Lab Results:  Recent Labs    03/19/20 0644 03/20/20 0536  WBC 9.8 10.1  HGB 13.1 13.0  HCT 38.8* 40.3  PLT 417* 383   BMET Recent Labs    03/19/20 0641 03/20/20 0536  NA 138 134*  K 4.4 4.3  CL 103 101  CO2 24 22  GLUCOSE 111* 107*  BUN 12 12  CREATININE 1.35* 1.24  CALCIUM 8.6* 8.6*   PT/INR Recent Labs    03/19/20 1654  LABPROT 15.7*  INR 1.3*   ABG No results for input(s): PHART, HCO3 in the last 72 hours.  Invalid input(s): PCO2, PO2  Studies/Results: CT Head Wo Contrast  Result Date: 03/19/2020 CLINICAL DATA:  Mental status change EXAM: CT HEAD WITHOUT CONTRAST TECHNIQUE: Contiguous axial images were obtained from the base of the skull through the vertex without intravenous contrast. COMPARISON:  10/25/2014 head CT and prior. FINDINGS: Brain: No acute infarct or intracranial hemorrhage. No mass lesion. No  midline shift or extra-axial fluid collection. Panventricular dilatation is unchanged. Scattered and confluent hypodense foci involving the periventricular and subcortical white matter nonspecific however commonly associated with chronic microvascular ischemic changes. Sequela of more remote bilateral cerebellar insults. Vascular: No hyperdense vessel or unexpected calcification. Bilateral skull base atherosclerotic calcifications. Skull: Negative for fracture or focal lesion. Sinuses/Orbits: Sequela of bilateral lens replacement. Clear paranasal sinuses. No mastoid effusion. Other: None. IMPRESSION: No acute intracranial process. Remote bilateral cerebellar infarcts. Panventricular dilatation, unchanged. Consider normal pressure hydrocephalus. Chronic microvascular ischemic changes are unchanged. Electronically Signed   By: Primitivo Gauze M.D.   On: 03/19/2020 09:25   CT Abdomen Pelvis W Contrast  Result Date: 03/19/2020 CLINICAL DATA:  Acute nonlocalized abdominal pain EXAM: CT ABDOMEN AND PELVIS WITH CONTRAST TECHNIQUE: Multidetector CT imaging of the abdomen and pelvis was performed using the standard protocol following bolus administration of intravenous contrast. CONTRAST:  78mL OMNIPAQUE IOHEXOL 300 MG/ML  SOLN COMPARISON:  08/24/2019 FINDINGS: Lower chest: Small right more than left pleural effusion and lower lobe atelectasis. Coronary atherosclerosis. Hepatobiliary: No focal liver abnormality.Cholecystectomy. No bile duct dilatation. Pancreas: Unremarkable. Spleen: Unremarkable. Adrenals/Urinary Tract: Negative adrenals. No hydronephrosis or ureteral stone. Punctate right lower pole calculi. Bilateral renal cystic densities which are unchanged. Bladder wall thickening which is circumferential and likely from chronic outlet obstruction. Near calcific density along the mucosal surface anteriorly and inferiorly, also seen previously. Stomach/Bowel: No obstruction. No detected bowel wall thickening, no  appendicitis. A small track like soft tissue density structure extends superiorly from the splenic flexure towards the subphrenic collection. Vascular/Lymphatic:  No acute vascular abnormality. No mass or adenopathy. Reproductive:Symmetric enlargement of the prostate. Other: Rim enhancing fluid collection in the anterior subphrenic space, contiguous with the right liver. Collection measures 13 cm in length by 3 cm in thickness. Postoperative right groin, possibly for hernia repair. Fatty umbilical hernia. Musculoskeletal: No acute abnormalities. Asymmetric muscular atrophy involving the right hip. IMPRESSION: 1. Right anterior subphrenic collection measuring up to 13 x 3 cm-most likely an abscess. No clear underlying cause but a colonic source is favored given close positioning of the splenic flexure with small track like structure projecting superiorly towards the collection. No active colon inflammation is seen. 2. Small right more than left pleural effusion, likely sympathetic. 3. Chronic findings are described above. Electronically Signed   By: Monte Fantasia M.D.   On: 03/19/2020 11:14   CT IMAGE GUIDED DRAINAGE BY PERCUTANEOUS CATHETER  Result Date: 03/21/2020 INDICATION: 83 year old male with a history subphrenic abscess EXAM: CT GUIDED DRAINAGE OF  ABSCESS MEDICATIONS: The patient is currently admitted to the hospital and receiving intravenous antibiotics. The antibiotics were administered within an appropriate time frame prior to the initiation of the procedure. ANESTHESIA/SEDATION: 0.25 mg IV Versed 50 mcg IV Fentanyl Moderate Sedation Time:  10 minutes The patient was continuously monitored during the procedure by the interventional radiology nurse under my direct supervision. COMPLICATIONS: None TECHNIQUE: Informed written consent was obtained from the patient after a thorough discussion of the procedural risks, benefits and alternatives. All questions were addressed. Maximal Sterile Barrier  Technique was utilized including caps, mask, sterile gowns, sterile gloves, sterile drape, hand hygiene and skin antiseptic. A timeout was performed prior to the initiation of the procedure. PROCEDURE: The operative field was prepped with Chlorhexidine in a sterile fashion, and a sterile drape was applied covering the operative field. A sterile gown and sterile gloves were used for the procedure. Local anesthesia was provided with 1% Lidocaine. Patient was positioned supine position on CT gantry table. Scout CT was acquired for planning purposes. The patient was then prepped and draped in the usual sterile fashion. 1% lidocaine was used for local anesthesia. Using CT guidance, trocar needle was advanced into the right upper quadrant abscess. Once we aspirated purulent material, modified Seldinger technique was used to place a 10 [place] drain. Proximally 50 cc of purulent material aspirated through the pigtail catheter. Ten [place] drain was sutured in place and attached to bulb suction. Final CT image was acquired. Patient tolerated the procedure well and remained hemodynamically stable throughout. No complications were encountered and no significant blood loss. FINDINGS: Right subphrenic abscess, similar to the comparison. Pigtail drainage catheter into the collection with approximately 50 cc of purulent material aspirated. IMPRESSION: Status post CT-guided drainage of right upper quadrant abscess. Signed, Dulcy Fanny. Dellia Nims, RPVI [facility] Electronically Signed   By: Corrie Mckusick D.O.   On: 03/21/2020 04:55    Anti-infectives: Anti-infectives (From admission, onward)   Start     Dose/Rate Route Frequency Ordered Stop   03/19/20 2000  metroNIDAZOLE (FLAGYL) IVPB 500 mg        500 mg 100 mL/hr over 60 Minutes Intravenous Every 8 hours 03/19/20 1812     03/19/20 2000  cefTRIAXone (ROCEPHIN) 2 g in sodium chloride 0.9 % 100 mL IVPB        2 g 200  mL/hr over 30 Minutes Intravenous Daily-1800 03/19/20 1830     03/19/20 1815  Ampicillin-Sulbactam (UNASYN) 3 g in sodium chloride 0.9 %  100 mL IVPB  Status:  Discontinued        3 g 200 mL/hr over 30 Minutes Intravenous Every 6 hours 03/19/20 1812 03/19/20 1830      Assessment/Plan: HTN - per medicine H/O nephrolithiasis - likely explains UA findings H/o CVA - on ASA DM - per medicine H/O polio  Right subphrenic abscess -Drain in place -Abx -No surgical plans at this time -Pt will likely need colonoscopy at some point down the road, but agree with Dr. Cristina Gong no urgent need at this time.   LOS: 2 days    Ralene Ok 03/21/2020

## 2020-03-22 DIAGNOSIS — E113553 Type 2 diabetes mellitus with stable proliferative diabetic retinopathy, bilateral: Secondary | ICD-10-CM | POA: Diagnosis not present

## 2020-03-22 DIAGNOSIS — K651 Peritoneal abscess: Secondary | ICD-10-CM | POA: Diagnosis not present

## 2020-03-22 DIAGNOSIS — G9341 Metabolic encephalopathy: Secondary | ICD-10-CM | POA: Diagnosis not present

## 2020-03-22 DIAGNOSIS — R5381 Other malaise: Secondary | ICD-10-CM | POA: Diagnosis not present

## 2020-03-22 DIAGNOSIS — E876 Hypokalemia: Secondary | ICD-10-CM

## 2020-03-22 LAB — CBC
HCT: 36.2 % — ABNORMAL LOW (ref 39.0–52.0)
Hemoglobin: 11.6 g/dL — ABNORMAL LOW (ref 13.0–17.0)
MCH: 30.2 pg (ref 26.0–34.0)
MCHC: 32 g/dL (ref 30.0–36.0)
MCV: 94.3 fL (ref 80.0–100.0)
Platelets: 347 10*3/uL (ref 150–400)
RBC: 3.84 MIL/uL — ABNORMAL LOW (ref 4.22–5.81)
RDW: 13.3 % (ref 11.5–15.5)
WBC: 10.3 10*3/uL (ref 4.0–10.5)
nRBC: 0 % (ref 0.0–0.2)

## 2020-03-22 LAB — GLUCOSE, CAPILLARY
Glucose-Capillary: 108 mg/dL — ABNORMAL HIGH (ref 70–99)
Glucose-Capillary: 139 mg/dL — ABNORMAL HIGH (ref 70–99)
Glucose-Capillary: 159 mg/dL — ABNORMAL HIGH (ref 70–99)
Glucose-Capillary: 170 mg/dL — ABNORMAL HIGH (ref 70–99)
Glucose-Capillary: 219 mg/dL — ABNORMAL HIGH (ref 70–99)
Glucose-Capillary: 233 mg/dL — ABNORMAL HIGH (ref 70–99)

## 2020-03-22 LAB — COMPREHENSIVE METABOLIC PANEL
ALT: 12 U/L (ref 0–44)
AST: 15 U/L (ref 15–41)
Albumin: 2.3 g/dL — ABNORMAL LOW (ref 3.5–5.0)
Alkaline Phosphatase: 68 U/L (ref 38–126)
Anion gap: 7 (ref 5–15)
BUN: 21 mg/dL (ref 8–23)
CO2: 21 mmol/L — ABNORMAL LOW (ref 22–32)
Calcium: 7.8 mg/dL — ABNORMAL LOW (ref 8.9–10.3)
Chloride: 107 mmol/L (ref 98–111)
Creatinine, Ser: 1.41 mg/dL — ABNORMAL HIGH (ref 0.61–1.24)
GFR calc Af Amer: 54 mL/min — ABNORMAL LOW (ref >60)
GFR calc non Af Amer: 46 mL/min — ABNORMAL LOW (ref >60)
Glucose, Bld: 121 mg/dL — ABNORMAL HIGH (ref 70–99)
Potassium: 3.4 mmol/L — ABNORMAL LOW (ref 3.5–5.1)
Sodium: 135 mmol/L (ref 135–145)
Total Bilirubin: 0.5 mg/dL (ref 0.3–1.2)
Total Protein: 6.3 g/dL — ABNORMAL LOW (ref 6.5–8.1)

## 2020-03-22 LAB — RPR: RPR Ser Ql: NONREACTIVE

## 2020-03-22 LAB — MAGNESIUM: Magnesium: 2 mg/dL (ref 1.7–2.4)

## 2020-03-22 LAB — PHOSPHORUS: Phosphorus: 1.6 mg/dL — ABNORMAL LOW (ref 2.5–4.6)

## 2020-03-22 MED ORDER — MAGNESIUM SULFATE 2 GM/50ML IV SOLN
2.0000 g | Freq: Once | INTRAVENOUS | Status: AC
  Administered 2020-03-22: 2 g via INTRAVENOUS
  Filled 2020-03-22: qty 50

## 2020-03-22 MED ORDER — GLUCERNA SHAKE PO LIQD
237.0000 mL | Freq: Three times a day (TID) | ORAL | Status: DC
  Administered 2020-03-22 – 2020-03-25 (×9): 237 mL via ORAL
  Filled 2020-03-22 (×12): qty 237

## 2020-03-22 MED ORDER — POTASSIUM CHLORIDE CRYS ER 20 MEQ PO TBCR
40.0000 meq | EXTENDED_RELEASE_TABLET | Freq: Once | ORAL | Status: AC
  Administered 2020-03-22: 40 meq via ORAL
  Filled 2020-03-22: qty 2

## 2020-03-22 MED ORDER — METOPROLOL TARTRATE 50 MG PO TABS
50.0000 mg | ORAL_TABLET | Freq: Two times a day (BID) | ORAL | Status: DC
  Administered 2020-03-22 – 2020-03-25 (×7): 50 mg via ORAL
  Filled 2020-03-22 (×7): qty 1

## 2020-03-22 NOTE — Progress Notes (Signed)
Referring Physician(s): Swayze,A  Supervising Physician: Arne Cleveland  Patient Status:  [facility] - In-pt  Chief Complaint:  Abdominal pain, right subphrenic fluid collection  Subjective: Patient without major complaints this morning; mildly tender at right upper quadrant drain site, denies nausea or vomiting or worsening respiratory issues; more alert   Allergies: Metformin and related, Other, and Soliqua [insulin glargine-lixisenatide]  Medications: Prior to Admission medications   Medication Sig Start Date End Date Taking? Authorizing Provider  aspirin EC 81 MG tablet Take 81 mg by mouth daily.   Yes [provider]  atorvastatin (LIPITOR) 20 MG tablet Take 20 mg by mouth every evening.  11/03/13  Yes [provider]  B Complex Vitamins (B COMPLEX PO) Take 1 tablet by mouth daily.   Yes [provider]  brimonidine (ALPHAGAN) 0.2 % ophthalmic solution Place 1 drop into both eyes 2 (two) times daily. 12/28/19  Yes [provider]  cholecalciferol (VITAMIN D-400) 10 MCG (400 UNIT) TABS tablet Take 400 Units by mouth daily.   Yes [provider]  dorzolamide-timolol (COSOPT) 22.3-6.8 MG/ML ophthalmic solution Place 1 drop into both eyes 2 (two) times daily. 03/04/20  Yes [provider]  gabapentin (NEURONTIN) 600 MG tablet Take 600 mg by mouth 2 (two) times daily. 01/20/20  Yes [provider]  insulin detemir (LEVEMIR) 100 UNIT/ML injection Inject 16 Units into the skin 2 (two) times daily.    Yes [provider]  latanoprost (XALATAN) 0.005 % ophthalmic solution Place 1 drop into both eyes at bedtime.  11/17/14  Yes [provider]  levothyroxine (SYNTHROID) 88 MCG tablet Take 88 mcg by mouth at bedtime.  04/21/11  Yes [provider]  metoprolol tartrate (LOPRESSOR) 25 MG tablet Take 25 mg by mouth 2 (two) times daily.   Yes [provider]  olmesartan (BENICAR) 20 MG tablet Take 20 mg  by mouth daily. 02/20/20  Yes [provider]  pioglitazone (ACTOS) 30 MG tablet Take 15 mg by mouth daily. 03/04/20  Yes [provider]  RHOPRESSA 0.02 % SOLN Place 1 drop into both eyes at bedtime. 03/05/20  Yes [provider]  Tamsulosin HCl (FLOMAX) 0.4 MG CAPS Take 0.4 mg by mouth 2 (two) times daily.    Yes [provider]  glucose blood test strip  04/04/15   [provider]  Multiple Vitamin (MULTIVITAMIN WITH MINERALS) TABS tablet Take 1 tablet by mouth daily. Patient not taking: Reported on 03/23/2019 09/06/13   [name], MD  ondansetron (ZOFRAN ODT) 4 MG disintegrating tablet 4mg  ODT q4 hours prn nausea/vomit Patient not taking: Reported on 11/12/2019 08/24/19   Drenda Freeze, MD  polyethylene glycol [facility] / Floria Raveling) packet Take 17 g by mouth daily. Patient not taking: Reported on 11/12/2019 09/06/13   [name], MD     Vital Signs: BP (!) 160/78 (BP Location: Left Arm)   Pulse 75   Temp 98.7 F (37.1 C) (Oral)   Resp 17   Ht 5\' 5"  (1.651 m)   Wt 177 lb (80.3 kg)   SpO2 97%   BMI 29.45 kg/m   Physical Exam awake, answers simple questions okay.  Right upper quadrant drain intact, insertion site okay, mildly tender, output 15 cc yesterday, 10 cc today of serosanguineous fluid, drain flushes okay  Imaging: CT Head Wo Contrast  Result Date: 03/19/2020 CLINICAL DATA:  Mental status change EXAM: CT HEAD WITHOUT CONTRAST TECHNIQUE: Contiguous axial images were obtained from the  base of the skull through the vertex without intravenous contrast. COMPARISON:  10/25/2014 head CT and prior. FINDINGS: Brain: No acute infarct or intracranial hemorrhage. No mass lesion. No midline shift or extra-axial fluid collection. Panventricular dilatation is unchanged. Scattered and confluent hypodense foci involving the periventricular and subcortical white matter nonspecific however commonly associated with chronic microvascular  ischemic changes. Sequela of more remote bilateral cerebellar insults. Vascular: No hyperdense vessel or unexpected calcification. Bilateral skull base atherosclerotic calcifications. Skull: Negative for fracture or focal lesion. Sinuses/Orbits: Sequela of bilateral lens replacement. Clear paranasal sinuses. No mastoid effusion. Other: None. IMPRESSION: No acute intracranial process. Remote bilateral cerebellar infarcts. Panventricular dilatation, unchanged. Consider normal pressure hydrocephalus. Chronic microvascular ischemic changes are unchanged. Electronically Signed   By: Primitivo Gauze M.D.   On: 03/19/2020 09:25   CT Abdomen Pelvis W Contrast  Result Date: 03/19/2020 CLINICAL DATA:  Acute nonlocalized abdominal pain EXAM: CT ABDOMEN AND PELVIS WITH CONTRAST TECHNIQUE: Multidetector CT imaging of the abdomen and pelvis was performed using the standard protocol following bolus administration of intravenous contrast. CONTRAST:  24mL OMNIPAQUE IOHEXOL 300 MG/ML  SOLN COMPARISON:  08/24/2019 FINDINGS: Lower chest: Small right more than left pleural effusion and lower lobe atelectasis. Coronary atherosclerosis. Hepatobiliary: No focal liver abnormality.Cholecystectomy. No bile duct dilatation. Pancreas: Unremarkable. Spleen: Unremarkable. Adrenals/Urinary Tract: Negative adrenals. No hydronephrosis or ureteral stone. Punctate right lower pole calculi. Bilateral renal cystic densities which are unchanged. Bladder wall thickening which is circumferential and likely from chronic outlet obstruction. Near calcific density along the mucosal surface anteriorly and inferiorly, also seen previously. Stomach/Bowel: No obstruction. No detected bowel wall thickening, no appendicitis. A small track like soft tissue density structure extends superiorly from the splenic flexure towards the subphrenic collection. Vascular/Lymphatic: No acute vascular abnormality. No mass or adenopathy. Reproductive:Symmetric enlargement  of the prostate. Other: Rim enhancing fluid collection in the anterior subphrenic space, contiguous with the right liver. Collection measures 13 cm in length by 3 cm in thickness. Postoperative right groin, possibly for hernia repair. Fatty umbilical hernia. Musculoskeletal: No acute abnormalities. Asymmetric muscular atrophy involving the right hip. IMPRESSION: 1. Right anterior subphrenic collection measuring up to 13 x 3 cm-most likely an abscess. No clear underlying cause but a colonic source is favored given close positioning of the splenic flexure with small track like structure projecting superiorly towards the collection. No active colon inflammation is seen. 2. Small right more than left pleural effusion, likely sympathetic. 3. Chronic findings are described above. Electronically Signed   By: Monte Fantasia M.D.   On: 03/19/2020 11:14   DG Chest Port 1 View  Result Date: 03/19/2020 CLINICAL DATA:  Cough EXAM: PORTABLE CHEST 1 VIEW COMPARISON:  Oct 25, 2014. FINDINGS: There are pleural effusions, larger on the right than on the left, with bibasilar atelectasis. The lungs elsewhere are clear. The heart size and pulmonary vascularity are normal. No adenopathy. Note that there is an azygos lobe on the right, an anatomic variant. No bone lesions. IMPRESSION: Small pleural effusions bilaterally, larger on the right than on the left, with bibasilar atelectasis. Lungs elsewhere clear. Heart size normal. Electronically Signed   By: Lowella Grip III M.D.   On: 03/19/2020 07:54   CT IMAGE GUIDED DRAINAGE BY PERCUTANEOUS CATHETER  Result Date: 03/21/2020 INDICATION: 84 year old male with a history subphrenic abscess EXAM: CT GUIDED DRAINAGE OF  ABSCESS MEDICATIONS: The patient is currently admitted to the hospital and receiving intravenous antibiotics. The antibiotics were administered within an appropriate time frame prior to  the initiation of the procedure. ANESTHESIA/SEDATION: 0.25 mg IV Versed 50 mcg  IV Fentanyl Moderate Sedation Time:  10 minutes The patient was continuously monitored during the procedure by the interventional radiology nurse under my direct supervision. COMPLICATIONS: None TECHNIQUE: Informed written consent was obtained from the patient after a thorough discussion of the procedural risks, benefits and alternatives. All questions were addressed. Maximal Sterile Barrier Technique was utilized including caps, mask, sterile gowns, sterile gloves, sterile drape, hand hygiene and skin antiseptic. A timeout was performed prior to the initiation of the procedure. PROCEDURE: The operative field was prepped with Chlorhexidine in a sterile fashion, and a sterile drape was applied covering the operative field. A sterile gown and sterile gloves were used for the procedure. Local anesthesia was provided with 1% Lidocaine. Patient was positioned supine position on CT gantry table. Scout CT was acquired for planning purposes. The patient was then prepped and draped in the usual sterile fashion. 1% lidocaine was used for local anesthesia. Using CT guidance, trocar needle was advanced into the right upper quadrant abscess. Once we aspirated purulent material, modified Seldinger technique was used to place a 10 [place] drain. Proximally 50 cc of purulent material aspirated through the pigtail catheter. Ten [place] drain was sutured in place and attached to bulb suction. Final CT image was acquired. Patient tolerated the procedure well and remained hemodynamically stable throughout. No complications were encountered and no significant blood loss. FINDINGS: Right subphrenic abscess, similar to the comparison. Pigtail drainage catheter into the collection with approximately 50 cc of purulent material aspirated. IMPRESSION: Status post CT-guided drainage of right upper quadrant abscess. Signed, Dulcy Fanny. Dellia Nims, RPVI [facility] Electronically Signed   By:  Corrie Mckusick D.O.   On: 03/21/2020 04:55    Labs:  CBC: Recent Labs    02/05/20 0421 03/19/20 0644 03/20/20 0536 03/22/20 0654  WBC 7.7 9.8 10.1 10.3  HGB 14.2 13.1 13.0 11.6*  HCT 43.7 38.8* 40.3 36.2*  PLT 264 417* 383 347    COAGS: Recent Labs    03/19/20 1654  INR 1.3*    BMP: Recent Labs    02/05/20 0421 03/19/20 0641 03/20/20 0536 03/22/20 0654  NA 138 138 134* 135  K 4.1 4.4 4.3 3.4*  CL 104 103 101 107  CO2 27 24 22  21*  GLUCOSE 142* 111* 107* 121*  BUN 16 12 12 21   CALCIUM 9.2 8.6* 8.6* 7.8*  CREATININE 1.37* 1.35* 1.24 1.41*  GFRNONAA 48* 49* 54* 46*  GFRAA 56* 57* >60 54*    LIVER FUNCTION TESTS: Recent Labs    02/05/20 0421 03/19/20 0641 03/20/20 0536 03/22/20 0654  BILITOT 0.6 0.8 1.1 0.5  AST 19 13* 15 15  ALT 15 13 14 12   ALKPHOS 112 88 86 68  PROT 7.7 7.6 7.4 6.3*  ALBUMIN 3.4* 2.7* 2.6* 2.3*    Assessment and Plan: 83 yo male with hx diabetes, CVA 2014, prior small bowel obstruction with resection, polio, hypertension, nephrolithiasis, BPH, arthritis, anemia and percutaneous cholecystostomy in November 2014 with drain injection in January 2015 revealing malpositioning and failed attempt to replace.  Patient underwent laparoscopic cholecystectomy on 07/25/2013.  He has had prior gallbladder fossa abscess drain, mid epigastric /perigastric abscess drain as well as hepatic abscess drain placements in 2015; now with right subphrenic fluid collection, status post drain placement on 10/2; afebrile, WBC normal, hemoglobin 11.6, creatinine 1.41 up from 1.24, drain fluid cultures pending; continue drain irrigation, output  monitoring.  Obtain follow-up CT within 1 week of drain placement; other plans per [facility]   Electronically Signed: D. Rowe Robert, PA-C 03/22/2020, 11:25 AM   I spent a total of 15 minutes at the the patient's bedside AND on the patient's hospital floor or unit, greater than 50% of which was counseling/coordinating care for  right subphrenic fluid collection drain    Patient ID: Mike Mack, male   DOB: 1935-09-03, 84 y.o.   MRN: [id number]

## 2020-03-22 NOTE — NC FL2 (Addendum)
[place] LEVEL OF CARE SCREENING TOOL     IDENTIFICATION  Patient Name: Mike Mack Birthdate: 01/29/1936 Sex: male Admission Date (Current Location): 03/19/2020  [facility] and [place] Number:  Herbalist and Address:  [facility],  [place], [place]      Provider Number: [id number]  Attending Physician Name and Address:  Mercy Riding, MD  Relative Name and Phone Number:  Hai, Grabe [phone]    Current Level of Care: Hospital Recommended Level of Care: [place] Prior Approval Number:    Date Approved/Denied:   PASRR Number: [id number]  Discharge Plan: SNF    Current Diagnoses: Patient Active Problem List   Diagnosis Date Noted  . Subphrenic abscess ([place]) 03/19/2020  . Failure to thrive (child) 03/19/2020  . Posterior vitreous detachment of both eyes 12/11/2019  . Syncope 10/25/2014  . AKI (acute kidney injury) ([place]) 10/25/2014  . H/O: CVA (cerebrovascular accident) 10/25/2014  . H/O poliomyelitis 10/25/2014  . Controlled type 2 diabetes mellitus with stable proliferative retinopathy of both eyes, with long-term current use of insulin ([place]) 10/25/2014  . Tachycardia 08/29/2013  . Cough 08/29/2013  . Monoparesis of leg ([place]) 08/20/2013  . Muscle weakness of lower extremity 08/20/2013  . Right upper quadrant abdominal abscess ([place]) 08/15/2013  . Hepatic abscess 08/15/2013  . Subacute delirium 08/14/2013  . Hyperglycemia 08/14/2013  . Oral thrush 08/14/2013  . Leukocytosis, unspecified 08/14/2013  . Debility 08/14/2013  . S/P cholecystectomy 07/29/2013  . Cholecystitis with cholelithiasis 07/25/2013  . Arthritis 06/06/2013  . Headache(784.0) 06/06/2013  . Headache 06/06/2013  . Generalized weakness 05/24/2013  . Cholecystitis, acute 05/24/2013  . RUQ abdominal pain 05/13/2013  . Acute cholecystitis: Probable 05/13/2013  . Constipation 05/13/2013  . Enterococcus UTI  05/13/2013  . Leukocytosis 05/13/2013  . Anemia 05/13/2013  . Acute left thalamic CVA (cerebral infarction) 04/10/2012  . Hypertension   . BPH (benign prostatic hyperplasia)   . Pruritic disorder 02/02/2011    Orientation RESPIRATION BLADDER Height & Weight     Self  Normal Continent Weight: 177 lb (80.3 kg) Height:  5\' 5"  (165.1 cm)  BEHAVIORAL SYMPTOMS/MOOD NEUROLOGICAL BOWEL NUTRITION STATUS      Continent Diet Low sodium heart healthy   AMBULATORY STATUS COMMUNICATION OF NEEDS Skin   Extensive Assist Verbally Surgical wounds  Abdominal Pain/Subphrenic abscess drain placed 10/3                     Personal Care Assistance Level of Assistance  Bathing, Feeding, Dressing Bathing Assistance: Maximum assistance Feeding assistance: Limited assistance Dressing Assistance: Maximum assistance     Functional Limitations Info  Sight, Hearing, Speech Sight Info: Impaired (Wears Glasses) Hearing Info: Adequate Speech Info: Adequate    SPECIAL CARE FACTORS FREQUENCY  PT (By licensed PT), OT (By licensed OT)     PT Frequency: 5x/week OT Frequency: 5x/week            Contractures Contractures Info: Not present    Additional Factors Info  Code Status, Allergies, Psychotropic, Insulin Sliding Scale Code Status Info: Fullcode Allergies Info: Allergies: Metformin And Related, Other, Soliqua Insulin Glargine-lixisenatide   Insulin Sliding Scale Info: See medication list       Current Medications (03/22/2020):  This is the current hospital active medication list Current Facility-Administered Medications  Medication Dose Route Frequency Provider Last Rate Last Admin  . 0.9 %  sodium chloride infusion   Intravenous PRN Mercy Riding, MD  Stopped at 03/22/20 0008  . 0.9 %  sodium chloride infusion   Intravenous Continuous Mercy Riding, MD 75 mL/hr at 03/22/20 0432 New Bag at 03/22/20 0432  . aspirin EC tablet 81 mg  81 mg Oral Daily Gonfa, Taye T, MD   81 mg at 03/22/20  1040  . atorvastatin (LIPITOR) tablet 20 mg  20 mg Oral QPM Wendee Beavers T, MD   20 mg at 03/21/20 1847  . brimonidine (ALPHAGAN) 0.2 % ophthalmic solution 1 drop  1 drop Both Eyes BID Swayze, Ava, DO   1 drop at 03/22/20 1041  . cefTRIAXone (ROCEPHIN) 2 g in sodium chloride 0.9 % 100 mL IVPB  2 g Intravenous q1800 Swayze, Ava, DO   Stopped at 03/21/20 1713  . Chlorhexidine Gluconate Cloth 2 % PADS 6 each  6 each Topical Daily Swayze, Ava, DO   6 each at 03/22/20 1041  . dorzolamide-timolol (COSOPT) 22.3-6.8 MG/ML ophthalmic solution 1 drop  1 drop Both Eyes BID Swayze, Ava, DO   1 drop at 03/22/20 1041  . feeding supplement (GLUCERNA SHAKE) (GLUCERNA SHAKE) liquid 237 mL  237 mL Oral TID BM Gonfa, Taye T, MD   237 mL at 03/22/20 1402  . gabapentin (NEURONTIN) capsule 100 mg  100 mg Oral BID Wendee Beavers T, MD   100 mg at 03/22/20 1040  . hydrALAZINE (APRESOLINE) injection 10 mg  10 mg Intravenous Q6H PRN Hollace Hayward K, NP   10 mg at 03/21/20 0637  . insulin aspart (novoLOG) injection 0-15 Units  0-15 Units Subcutaneous Q4H Swayze, Ava, DO   5 Units at 03/22/20 1358  . irbesartan (AVAPRO) tablet 150 mg  150 mg Oral Daily Wendee Beavers T, MD   150 mg at 03/22/20 1040  . latanoprost (XALATAN) 0.005 % ophthalmic solution 1 drop  1 drop Both Eyes QHS Swayze, Ava, DO   1 drop at 03/21/20 2221  . levothyroxine (SYNTHROID) tablet 88 mcg  88 mcg Oral Q0600 Mercy Riding, MD   88 mcg at 03/22/20 0523  . metoprolol tartrate (LOPRESSOR) tablet 50 mg  50 mg Oral BID Wendee Beavers T, MD   50 mg at 03/22/20 1040  . metroNIDAZOLE (FLAGYL) IVPB 500 mg  500 mg Intravenous Q8H Swayze, Ava, DO 100 mL/hr at 03/22/20 1401 500 mg at 03/22/20 1401  . multivitamin with minerals tablet 1 tablet  1 tablet Oral Daily Mercy Riding, MD   1 tablet at 03/22/20 1040  . Netarsudil Dimesylate 0.02 % SOLN 1 drop  1 drop Both Eyes QHS Swayze, Ava, DO      . sodium chloride flush (NS) 0.9 % injection 5 mL  5 mL Intracatheter Q8H Corrie Mckusick, DO   5 mL at 03/22/20 0528  . tamsulosin (FLOMAX) capsule 0.4 mg  0.4 mg Oral BID Wendee Beavers T, MD   0.4 mg at 03/22/20 1040     Discharge Medications: Please see discharge summary for a list of discharge medications.  Relevant Imaging Results:  Relevant Lab Results:   Additional Information ssn: [id number]  Lia Hopping, LCSW

## 2020-03-22 NOTE — Progress Notes (Signed)
PROGRESS NOTE  Mike Mack IOX:[id number] DOB: 1935/11/22   PCP: Jani Gravel, MD  Patient is from: home.  Uses walker at baseline.  DOA: 03/19/2020 LOS: 3  Brief Narrative / Interim history: 84 year old male with history of DM-2, calculus cholecystitis status post lap chole in 07/6832, complicated by gallbladder fossa abscess requiring IR drainage and IV antibiotic in 08/2013, CKD-3A, CVA, cognitive decline, HTN, post polio syndrome and neuropathy presenting with generalized weakness, poor p.o. intake and right upper quadrant pain for about 2 weeks.  In ED, hypertensive hypertensive to 187/86.  Afebrile.  WBC 9.8. Cr 1.35.  Lipase 20.  Troponin 6. UA with large Hgb, 5 ketones, rare bacteria.  CXR with small bilateral pleural effusions and atelectasis.  CT head without acute finding.  CT abdomen and pelvis with right anterior subphrenic collection measuring up to 13 x 3 cm concerning for abscess. Per radiology, concern about colonic source given proximity.  Cultures obtained.  Patient was started on IV Rocephin and Flagyl.  General surgery, GI and IR consulted.   Patient had drain placement by IR on 03/20/2020.  Surgical culture pending.  Subjective: Seen and examined earlier this morning.  No major events overnight of this morning.  Awake and more alert today.  He is oriented to self and place.  No complaints.  He denies pain.  Drain output, 15 cc charted.  Objective: Vitals:   03/21/20 1356 03/21/20 2158 03/22/20 0541 03/22/20 1341  BP: (!) 143/63 139/67 (!) 160/78 (!) 158/75  Pulse: 83 82 75 60  Resp: 18 16 17 20   Temp: 99.5 F (37.5 C) 99.7 F (37.6 C) 98.7 F (37.1 C) 98.7 F (37.1 C)  TempSrc:  Oral Oral Oral  SpO2: 95% 96% 97% 97%  Weight:      Height:        Intake/Output Summary (Last 24 hours) at 03/22/2020 1529 Last data filed at 03/22/2020 1105 Gross per 24 hour  Intake 2597.15 ml  Output 435 ml  Net 2162.15 ml   Filed Weights   03/19/20 0638 03/19/20 1002   Weight: 80.3 kg 80.3 kg    Examination:  GENERAL: No apparent distress.  Nontoxic. HEENT: MMM.  Vision and hearing grossly intact.  NECK: Supple.  No apparent JVD.  RESP:  No IWOB.  Fair aeration bilaterally. CVS:  RRR. Heart sounds normal.  ABD/GI/GU: BS+. Abd soft, NTND.  JP drain to RLQ. None in JP MSK/EXT:  Moves extremities.  RLE atrophy and weakness from polio. SKIN: no apparent skin lesion or wound NEURO: Awake and alert.  Oriented to self and place.  Right lower extremity weakness PSYCH: Calm. Normal affect.   Procedures:  03/20/2020-drain placement by IR  Microbiology summarized: COVID-19 PCR negative. Blood cultures NGTD Urine culture NGTD MRSA PCR negative Surgical culture NGTD  Assessment & Plan: Subphrenic abscess-CT abdomen and pelvis with right anterior subphrenic collection measuring up to 13 x 3 cm concerning for abscess. He has history of lap chole complicated by right hepatic and gallbladder fossa abscess in 2015.  -General surgery, [facility] and IR consulted -Drain placement by IR on 03/20/2020 -GI and general surgery signed off.  -IR recommended drain irrigation, output monitoring repeat CT in 1 week -Continue IV Rocephin and Flagyl-will consult ID for antibiotic course  Acute metabolic encephalopathy in patient with dementia: Seems to be waxing or waning concerning for delirium.  He is awake and more alert today.  Oriented x2. -Reorientation and delirium precautions. -Minimize or avoid sedating medications  Uncontrolled  DM-2 complicated by chronic kidney disease: A1c 8.4%.  On Actos at home. Recent Labs  Lab 03/21/20 2057 03/22/20 0001 03/22/20 0416 03/22/20 0718 03/22/20 1154  GLUCAP 183* 170* 139* 108* 219*  -Continue SSI-moderate  CKD-3A: Relatively stable. Lab Results  Component Value Date   CREATININE 1.41 (H) 03/22/2020   CREATININE 1.24 03/20/2020   CREATININE 1.35 (H) 03/19/2020  -Continue monitoring  Essential hypertension: BP  slightly elevated. -Increase home metoprolol to 50 mg twice daily -Continue Avapro.  Hypokalemia/hypomagnesemia: K3.4.  Mg 1.6. -Replenish and recheck  Hypothyroidism: TSH within normal -Continue home Synthroid.  Debility/Generlized weakness/history of left thalamic CVA/failure to Thrive:  -PT/OT to eval and treat when appropriate. Wt Readings from Last 10 Encounters:  03/19/20 80.3 kg  12/07/19 83.9 kg  11/20/19 81.6 kg  08/24/19 86.2 kg  03/23/19 83 kg  10/03/18 83.2 kg  04/18/17 77.6 kg  12/03/14 77.5 kg  10/28/14 77.3 kg  11/08/13 57.2 kg  -Consult dietitian  BPH: No urinary symptoms. -Monitor urine output -Continue home Flomax.  Body mass index is 29.45 kg/m.        DVT prophylaxis:  SCDs Start: 03/19/20 1242  Code Status: Full code  Family Communication: Updated patient's wife at bedside on 10/3.  None at bedside today. Status is: Inpatient  Remains inpatient appropriate because:Altered mental status, Ongoing diagnostic testing needed not appropriate for outpatient work up, IV treatments appropriate due to intensity of illness or inability to take PO and Inpatient level of care appropriate due to severity of illness   Dispo: The patient is from: Home              Anticipated d/c is to: SNF              Anticipated d/c date is: 1 day              Patient currently is not medically stable to d/c.       Consultants:  General surgery-signed off [facility]-signed off Interventional radiology Infectious disease    Sch Meds:  Scheduled Meds: . aspirin EC  81 mg Oral Daily  . atorvastatin  20 mg Oral QPM  . brimonidine  1 drop Both Eyes BID  . Chlorhexidine Gluconate Cloth  6 each Topical Daily  . dorzolamide-timolol  1 drop Both Eyes BID  . feeding supplement (GLUCERNA SHAKE)  237 mL Oral TID BM  . gabapentin  100 mg Oral BID  . insulin aspart  0-15 Units Subcutaneous Q4H  . irbesartan  150 mg Oral Daily  . latanoprost  1 drop Both Eyes QHS  .  levothyroxine  88 mcg Oral Q0600  . metoprolol tartrate  50 mg Oral BID  . multivitamin with minerals  1 tablet Oral Daily  . Netarsudil Dimesylate  1 drop Both Eyes QHS  . sodium chloride flush  5 mL Intracatheter Q8H  . tamsulosin  0.4 mg Oral BID   Continuous Infusions: . sodium chloride Stopped (03/22/20 0008)  . sodium chloride 75 mL/hr at 03/22/20 0432  . cefTRIAXone (ROCEPHIN)  IV Stopped (03/21/20 1713)  . metronidazole 500 mg (03/22/20 1401)   PRN Meds:.sodium chloride, hydrALAZINE  Antimicrobials: Anti-infectives (From admission, onward)   Start     Dose/Rate Route Frequency Ordered Stop   03/19/20 2000  metroNIDAZOLE (FLAGYL) IVPB 500 mg        500 mg 100 mL/hr over 60 Minutes Intravenous Every 8 hours 03/19/20 1812     03/19/20 2000  cefTRIAXone (ROCEPHIN) 2  g in sodium chloride 0.9 % 100 mL IVPB        2 g 200 mL/hr over 30 Minutes Intravenous Daily-1800 03/19/20 1830     03/19/20 1815  Ampicillin-Sulbactam (UNASYN) 3 g in sodium chloride 0.9 % 100 mL IVPB  Status:  Discontinued        3 g 200 mL/hr over 30 Minutes Intravenous Every 6 hours 03/19/20 1812 03/19/20 1830       I have personally reviewed the following labs and images: CBC: Recent Labs  Lab 03/19/20 0644 03/20/20 0536 03/22/20 0654  WBC 9.8 10.1 10.3  NEUTROABS 7.5  --   --   HGB 13.1 13.0 11.6*  HCT 38.8* 40.3 36.2*  MCV 92.4 92.4 94.3  PLT 417* 383 347   BMP &GFR Recent Labs  Lab 03/19/20 0641 03/20/20 0536 03/22/20 0654  NA 138 134* 135  K 4.4 4.3 3.4*  CL 103 101 107  CO2 24 22 21*  GLUCOSE 111* 107* 121*  BUN 12 12 21   CREATININE 1.35* 1.24 1.41*  CALCIUM 8.6* 8.6* 7.8*  MG  --   --  2.0  PHOS  --   --  1.6*   Estimated Creatinine Clearance: 40.1 mL/min (A) (by C-G formula based on SCr of 1.41 mg/dL (H)). Liver & Pancreas: Recent Labs  Lab 03/19/20 0641 03/20/20 0536 03/22/20 0654  AST 13* 15 15  ALT 13 14 12   ALKPHOS 88 86 68  BILITOT 0.8 1.1 0.5  PROT 7.6 7.4  6.3*  ALBUMIN 2.7* 2.6* 2.3*   Recent Labs  Lab 03/19/20 0715  LIPASE 20   Recent Labs  Lab 03/21/20 1729  AMMONIA 16   Diabetic: Recent Labs    03/19/20 1849  HGBA1C 8.4*   Recent Labs  Lab 03/21/20 2057 03/22/20 0001 03/22/20 0416 03/22/20 0718 03/22/20 1154  GLUCAP 183* 170* 139* 108* 219*   Cardiac Enzymes: No results for input(s): CKTOTAL, CKMB, CKMBINDEX, TROPONINI in the last 168 hours. No results for input(s): PROBNP in the last 8760 hours. Coagulation Profile: Recent Labs  Lab 03/19/20 1654  INR 1.3*   Thyroid Function Tests: Recent Labs    03/21/20 1729  TSH 1.432   Lipid Profile: No results for input(s): CHOL, HDL, LDLCALC, TRIG, CHOLHDL, LDLDIRECT in the last 72 hours. Anemia Panel: No results for input(s): VITAMINB12, FOLATE, FERRITIN, TIBC, IRON, RETICCTPCT in the last 72 hours. Urine analysis:    Component Value Date/Time   COLORURINE YELLOW 03/19/2020 0933   APPEARANCEUR CLEAR 03/19/2020 0933   LABSPEC 1.013 03/19/2020 0933   PHURINE 7.0 03/19/2020 0933   GLUCOSEU NEGATIVE 03/19/2020 0933   HGBUR LARGE (A) 03/19/2020 0933   BILIRUBINUR NEGATIVE 03/19/2020 0933   KETONESUR 5 (A) 03/19/2020 0933   PROTEINUR 100 (A) 03/19/2020 0933   UROBILINOGEN 0.2 10/25/2014 1520   NITRITE NEGATIVE 03/19/2020 0933   LEUKOCYTESUR SMALL (A) 03/19/2020 0933   Sepsis Labs: Invalid input(s): PROCALCITONIN, [place]  Microbiology: Recent Results (from the past 240 hour(s))  Respiratory Panel by RT PCR (Flu A&B, Covid) - Nasopharyngeal Swab     Status: None   Collection Time: 03/19/20  7:15 AM   Specimen: Nasopharyngeal Swab  Result Value Ref Range Status   SARS Coronavirus 2 by RT PCR NEGATIVE NEGATIVE Final    Comment: (NOTE) SARS-CoV-2 target nucleic acids are NOT DETECTED.  The SARS-CoV-2 RNA is generally detectable in upper respiratoy specimens during the acute phase of infection. The lowest concentration of SARS-CoV-2 viral copies this  assay can detect is 131 copies/mL. A negative result does not preclude SARS-Cov-2 infection and should not be used as the sole basis for treatment or other patient management decisions. A negative result may occur with  improper specimen collection/handling, submission of specimen other than nasopharyngeal swab, presence of viral mutation(s) within the areas targeted by this assay, and inadequate number of viral copies (<131 copies/mL). A negative result must be combined with clinical observations, patient history, and epidemiological information. The expected result is Negative.  Fact Sheet for Patients:  PinkCheek.be  Fact Sheet for Healthcare Providers:  GravelBags.it  This test is no t yet approved or cleared by the [place] FDA and  has been authorized for detection and/or diagnosis of SARS-CoV-2 by FDA under an Emergency Use Authorization (EUA). This EUA will remain  in effect (meaning this test can be used) for the duration of the COVID-19 declaration under Section 564(b)(1) of the Act, 21 U.S.C. section 360bbb-3(b)(1), unless the authorization is terminated or revoked sooner.     Influenza A by PCR NEGATIVE NEGATIVE Final   Influenza B by PCR NEGATIVE NEGATIVE Final    Comment: (NOTE) The Xpert Xpress SARS-CoV-2/FLU/RSV assay is intended as an aid in  the diagnosis of influenza from Nasopharyngeal swab specimens and  should not be used as a sole basis for treatment. Nasal washings and  aspirates are unacceptable for Xpert Xpress SARS-CoV-2/FLU/RSV  testing.  Fact Sheet for Patients: PinkCheek.be  Fact Sheet for Healthcare Providers: GravelBags.it  This test is not yet approved or cleared by the [place] FDA and  has been authorized for detection and/or diagnosis of SARS-CoV-2 by  FDA under an Emergency Use Authorization (EUA). This EUA will  remain  in effect (meaning this test can be used) for the duration of the  Covid-19 declaration under Section 564(b)(1) of the Act, 21  U.S.C. section 360bbb-3(b)(1), unless the authorization is  terminated or revoked. Performed at [facility], [place] [address], [place], [place] [address]   Urine culture     Status: None   Collection Time: 03/19/20  9:33 AM   Specimen: In/Out Cath Urine  Result Value Ref Range Status   Specimen Description   Final    IN/OUT CATH URINE Performed at [facility] [address], [place], [place] [address]    Special Requests   Final    NONE Performed at [facility], [place] [address], [place], [place] [address]    Culture   Final    NO GROWTH Performed at [facility], [place] [address], [place], [place] [address]    Report Status 03/20/2020 FINAL  Final  Culture, blood (routine x 2)     Status: None (Preliminary result)   Collection Time: 03/19/20 11:30 AM   Specimen: BLOOD LEFT HAND  Result Value Ref Range Status   Specimen Description   Final    BLOOD LEFT HAND Performed at [facility] [address], [place], [place] [address]    Special Requests   Final    BOTTLES DRAWN AEROBIC AND ANAEROBIC Blood Culture adequate volume Performed at [facility] [address] [place], [place] [address]    Culture   Final    NO GROWTH 3 DAYS Performed at [facility], [place] [address], [place], [place] [address]    Report Status PENDING  Incomplete  Culture, blood (routine x 2)     Status: None (Preliminary result)  Collection Time: 03/19/20 11:45 AM   Specimen: BLOOD RIGHT HAND  Result Value Ref Range Status   Specimen Description   Final    BLOOD RIGHT HAND Performed at [facility] [address] [place], [place] [address]    Special Requests   Final    BOTTLES DRAWN AEROBIC AND ANAEROBIC Blood Culture adequate  volume Performed at [facility] [address] [place], [place] [address]    Culture   Final    NO GROWTH 3 DAYS Performed at [facility], [place] [address] [place], [place] [address]    Report Status PENDING  Incomplete  MRSA PCR Screening     Status: None   Collection Time: 03/20/20  5:37 AM   Specimen: Nasal Mucosa; Nasopharyngeal  Result Value Ref Range Status   MRSA by PCR NEGATIVE NEGATIVE Final    Comment:        The GeneXpert MRSA Assay (FDA approved for NASAL specimens only), is one component of a comprehensive MRSA colonization surveillance program. It is not intended to diagnose MRSA infection nor to guide or monitor treatment for MRSA infections. Performed at [facility], [place] [address] [place], [place] [address]   Aerobic/Anaerobic Culture (surgical/deep wound)     Status: None (Preliminary result)   Collection Time: 03/20/20  4:09 PM   Specimen: Abscess  Result Value Ref Range Status   Specimen Description   Final    ABSCESS Performed at [facility] [address], [place], [place] [address]    Special Requests   Final    ABDOMEN Performed at [facility], [place] [address] [place], [place] [address]    Gram Stain   Final    ABUNDANT WBC PRESENT, PREDOMINANTLY PMN NO ORGANISMS SEEN Performed at [facility], [place] [address], [place], [place] [address]    Culture   Final    FEW MORGANELLA MORGANII NO ANAEROBES ISOLATED; CULTURE IN PROGRESS FOR 5 DAYS    Report Status PENDING  Incomplete    Radiology Studies: No results found.    Faviola Klare T. Edison  If 7PM-7AM, please contact night-coverage [email or web address] 03/22/2020, 3:29 PM

## 2020-03-22 NOTE — Progress Notes (Signed)
Subjective/Chief Complaint: NAEO. Denies pain. Tolerating PO and denies nausea or emesis. No reports CP, SOB, urinary sxs.   Afebrile, VSS JP - 20 cc WBC 10  Cr 1.41 - slightly up Objective: Vital signs in last 24 hours: Temp:  [98.3 F (36.8 C)-99.7 F (37.6 C)] 98.7 F (37.1 C) (10/04 0541) Pulse Rate:  [75-88] 75 (10/04 0541) Resp:  [16-18] 17 (10/04 0541) BP: (139-160)/(60-78) 160/78 (10/04 0541) SpO2:  [95 %-97 %] 97 % (10/04 0541) Last BM Date: 03/20/20  Intake/Output from previous day: 10/03 0701 - 10/04 0700 In: 1637.9 [P.O.:415; I.V.:856.6; IV Piggyback:366.2] Out: 530 [Urine:515; Drains:15] Intake/Output this shift: No intake/output data recorded.  PE:  Constitutional: No acute distress, conversant, appears states age. Eyes: Anicteric sclerae, moist conjunctiva, no lid lag Lungs: Clear to auscultation bilaterally, normal respiratory effort CV: regular rate and rhythm, no murmurs, no peripheral edema, pedal pulses 2+ GI: Soft, no masses or hepatosplenomegaly, non-tender to palpation, JP RUQ SS Skin: No rashes, palpation reveals normal turgor Psychiatric: appropriate judgment and insight, oriented to person, place, and time  Lab Results:  Recent Labs    03/20/20 0536 03/22/20 0654  WBC 10.1 10.3  HGB 13.0 11.6*  HCT 40.3 36.2*  PLT 383 347   BMET Recent Labs    03/20/20 0536 03/22/20 0654  NA 134* 135  K 4.3 3.4*  CL 101 107  CO2 22 21*  GLUCOSE 107* 121*  BUN 12 21  CREATININE 1.24 1.41*  CALCIUM 8.6* 7.8*   PT/INR Recent Labs    03/19/20 1654  LABPROT 15.7*  INR 1.3*   ABG No results for input(s): PHART, HCO3 in the last 72 hours.  Invalid input(s): PCO2, PO2  Studies/Results: CT IMAGE GUIDED DRAINAGE BY PERCUTANEOUS CATHETER  Result Date: 03/21/2020 INDICATION: 84 year old male with a history subphrenic abscess EXAM: CT GUIDED DRAINAGE OF  ABSCESS MEDICATIONS: The patient is currently admitted to the hospital and receiving  intravenous antibiotics. The antibiotics were administered within an appropriate time frame prior to the initiation of the procedure. ANESTHESIA/SEDATION: 0.25 mg IV Versed 50 mcg IV Fentanyl Moderate Sedation Time:  10 minutes The patient was continuously monitored during the procedure by the interventional radiology nurse under my direct supervision. COMPLICATIONS: None TECHNIQUE: Informed written consent was obtained from the patient after a thorough discussion of the procedural risks, benefits and alternatives. All questions were addressed. Maximal Sterile Barrier Technique was utilized including caps, mask, sterile gowns, sterile gloves, sterile drape, hand hygiene and skin antiseptic. A timeout was performed prior to the initiation of the procedure. PROCEDURE: The operative field was prepped with Chlorhexidine in a sterile fashion, and a sterile drape was applied covering the operative field. A sterile gown and sterile gloves were used for the procedure. Local anesthesia was provided with 1% Lidocaine. Patient was positioned supine position on CT gantry table. Scout CT was acquired for planning purposes. The patient was then prepped and draped in the usual sterile fashion. 1% lidocaine was used for local anesthesia. Using CT guidance, trocar needle was advanced into the right upper quadrant abscess. Once we aspirated purulent material, modified Seldinger technique was used to place a 10 [place] drain. Proximally 50 cc of purulent material aspirated through the pigtail catheter. Ten [place] drain was sutured in place and attached to bulb suction. Final CT image was acquired. Patient tolerated the procedure well and remained hemodynamically stable throughout. No complications were encountered and no significant blood loss. FINDINGS: Right subphrenic abscess, similar to the comparison. Pigtail  drainage catheter into the collection with approximately 50 cc of purulent material aspirated. IMPRESSION: Status post  CT-guided drainage of right upper quadrant abscess. Signed, Dulcy Fanny. Dellia Nims, RPVI [facility] Electronically Signed   By: Corrie Mckusick D.O.   On: 03/21/2020 04:55    Anti-infectives: Anti-infectives (From admission, onward)   Start     Dose/Rate Route Frequency Ordered Stop   03/19/20 2000  metroNIDAZOLE (FLAGYL) IVPB 500 mg        500 mg 100 mL/hr over 60 Minutes Intravenous Every 8 hours 03/19/20 1812     03/19/20 2000  cefTRIAXone (ROCEPHIN) 2 g in sodium chloride 0.9 % 100 mL IVPB        2 g 200 mL/hr over 30 Minutes Intravenous Daily-1800 03/19/20 1830     03/19/20 1815  Ampicillin-Sulbactam (UNASYN) 3 g in sodium chloride 0.9 % 100 mL IVPB  Status:  Discontinued        3 g 200 mL/hr over 30 Minutes Intravenous Every 6 hours 03/19/20 1812 03/19/20 1830      Assessment/Plan: HTN - per medicine H/O nephrolithiasis - likely explains UA findings H/o CVA - on ASA DM - per medicine H/O polio  Right subphrenic abscess - afebrile, VSS, WBC 10 -improving with drain in place -Abx  -No surgical plans at this time -Pt will likely need colonoscopy at some point down the road, but agree with Dr. Cristina Gong no urgent need at this time.   FEN: HH/carb mod ID: Rocephin/Flagyl 10/1 >> VTE: SCD's, ok for chemical VTE from surgical perspective  Foley: external cath   LOS: 3 days    Jill Alexanders 03/22/2020

## 2020-03-22 NOTE — TOC Progression Note (Addendum)
Transition of Care [facility]) - Progression Note    Patient Details  Name: Mike Mack MRN: [id number] Date of Birth: February 08, 1936  Transition of Care [facility]) CM/SW [place], LCSW Phone Number: 03/22/2020, 3:06 PM  Clinical Narrative:    CSW reached out to the patient spouse Mrs. Mike Mack. She gave csw permission for CSW to send referrals to local SNF's, however she has requested to meet with csw in am to discuss the patient discharge plan further.    Expected Discharge Plan: [place] Barriers to Discharge: No Barriers Identified  Expected Discharge Plan and Services Expected Discharge Plan: [place] In-house Referral: Clinical Social Work   Post Acute Care Choice: [place] Living arrangements for the past 2 months: Single Family Home                                       Social Determinants of Health (SDOH) Interventions    Readmission Risk Interventions No flowsheet data found.

## 2020-03-22 NOTE — Consult Note (Signed)
[facility]  Total days of antibiotics 4/ctx and metronidazole               Reason for Consult:subphrenic abscess    Referring Physician: Cyndia Skeeters  Principal Problem:   Subphrenic abscess ([place]) Active Problems:   RUQ abdominal pain   Leukocytosis   Generalized weakness   Debility   Controlled type 2 diabetes mellitus with stable proliferative retinopathy of both eyes, with long-term current use of insulin (HCC)   Failure to thrive (child)    HPI: Mike Mack is a 84 y.o. male hx of polio as a child, type 2 DM, dementia, cholecystectomy in 2015,c/b subhepatic abscess, anemia, but most recently having RUQ pain and weakness which brought him to hospital for evaluation. The patient had CT showing 13 x 3 cm subphrenic abscess contigious with liver but no clear connection with diverticulitis. IR able to drain28mL initially, and then has had 25ml subsequent drainage. It is flushed twice a day. Has serous fluid in bulb. Evaluated by GI who felt can consider colonoscopy after treatment of abscess. General surgery also evaluated patient and did not feel need for surgery at this time. He is on ceftriaxone plus metronidazole. Awaiting culture/sensitivities.  Past Medical History:  Diagnosis Date  . Anemia 05/13/2013  . Arthritis    "right leg" (07/25/2013)  . BPH (benign prostatic hyperplasia)   . Constipation 05/13/2013  . Exertional shortness of breath    "sometimes" (07/25/2013)  . History of kidney stones   . History of stomach ulcers   . Hypertension   . Neuropathy   . Polio   . Polio Childhood  . Small bowel obstruction ([place])   . Stroke [facility]) 2014   residual:  "left hand kind of numb" (07/25/2013)  . Type II diabetes mellitus (HCC)     Allergies:  Allergies  Allergen Reactions  . Metformin And Related Other (See Comments)    Gi intolerance  . Other     Other reaction(s): Other (See Comments) Renal insufficiency Other reaction(s): GI Upset  (intolerance) Gi intolerance  . [name] [Insulin Glargine-Lixisenatide] Other (See Comments)    Stomach cramps    Current antibiotics:   MEDICATIONS: . aspirin EC  81 mg Oral Daily  . atorvastatin  20 mg Oral QPM  . brimonidine  1 drop Both Eyes BID  . Chlorhexidine Gluconate Cloth  6 each Topical Daily  . dorzolamide-timolol  1 drop Both Eyes BID  . feeding supplement (GLUCERNA SHAKE)  237 mL Oral TID BM  . gabapentin  100 mg Oral BID  . insulin aspart  0-15 Units Subcutaneous Q4H  . irbesartan  150 mg Oral Daily  . latanoprost  1 drop Both Eyes QHS  . levothyroxine  88 mcg Oral Q0600  . metoprolol tartrate  50 mg Oral BID  . multivitamin with minerals  1 tablet Oral Daily  . Netarsudil Dimesylate  1 drop Both Eyes QHS  . sodium chloride flush  5 mL Intracatheter Q8H  . tamsulosin  0.4 mg Oral BID    Social History   Tobacco Use  . Smoking status: Never Smoker  . Smokeless tobacco: Never Used  Vaping Use  . Vaping Use: Never used  Substance Use Topics  . Alcohol use: No  . Drug use: No    Family History  Problem Relation Age of Onset  . Heart failure Mother   . Diabetes Sister   . Diabetes Sister   . Diabetes Brother   .  Hypertension Sister   . Hypertension Sister   . Hypertension Brother   . Heart attack Sister      Review of Systems  Constitutional: Negative for fever, chills, diaphoresis, activity change, appetite change, fatigue and unexpected weight change.  HENT: Negative for congestion, sore throat, rhinorrhea, sneezing, trouble swallowing and sinus pressure.  Eyes: Negative for photophobia and visual disturbance.  Respiratory: Negative for cough, chest tightness, shortness of breath, wheezing and stridor.  Cardiovascular: Negative for chest pain, palpitations and leg swelling.  Gastrointestinal: Negative for nausea, vomiting, abdominal pain, diarrhea, constipation, blood in stool, abdominal distention and anal bleeding.  Genitourinary: Negative for  dysuria, hematuria, flank pain and difficulty urinating.  Musculoskeletal: Negative for myalgias, back pain, joint swelling, arthralgias and gait problem.  Skin: Negative for color change, pallor, rash and wound.  Neurological: Negative for dizziness, tremors, weakness and light-headedness.  Hematological: Negative for adenopathy. Does not bruise/bleed easily.  Psychiatric/Behavioral: Negative for behavioral problems, confusion, sleep disturbance, dysphoric mood, decreased concentration and agitation.     OBJECTIVE: Temp:  [98.7 F (37.1 C)-99.7 F (37.6 C)] 98.7 F (37.1 C) (10/04 1341) Pulse Rate:  [60-82] 60 (10/04 1341) Resp:  [16-20] 20 (10/04 1341) BP: (139-160)/(67-78) 158/75 (10/04 1341) SpO2:  [96 %-97 %] 97 % (10/04 1341) Physical Exam  Constitutional: He is oriented to person, place, and time. He appears well-developed and well-nourished. No distress.  HENT:  Mouth/Throat: Oropharynx is clear and moist. No oropharyngeal exudate.  Cardiovascular: Normal rate, regular rhythm and normal heart sounds. Exam reveals no gallop and no friction rub.  No murmur heard.  Pulmonary/Chest: Effort normal and breath sounds normal. No respiratory distress. He has no wheezes.  Abdominal: Soft. Bowel sounds are normal. He exhibits no distension. There is no tenderness. Right upper quadrant drain Lymphadenopathy:  He has no cervical adenopathy.  Neurological: He is alert and oriented to person, place, and time.  Skin: Skin is warm and dry. No rash noted. No erythema.  Psychiatric: He has a normal mood and affect. His behavior is normal.     LABS: Results for orders placed or performed during the hospital encounter of 03/19/20 (from the past 48 hour(s))  Glucose, capillary     Status: Abnormal   Collection Time: 03/20/20  7:57 PM  Result Value Ref Range   Glucose-Capillary 115 (H) 70 - 99 mg/dL    Comment: Glucose reference range applies only to samples taken after fasting for at least  8 hours.   Comment 1 Notify RN   Glucose, capillary     Status: Abnormal   Collection Time: 03/20/20 11:40 PM  Result Value Ref Range   Glucose-Capillary 133 (H) 70 - 99 mg/dL    Comment: Glucose reference range applies only to samples taken after fasting for at least 8 hours.   Comment 1 Notify RN   Glucose, capillary     Status: Abnormal   Collection Time: 03/21/20  4:04 AM  Result Value Ref Range   Glucose-Capillary 116 (H) 70 - 99 mg/dL    Comment: Glucose reference range applies only to samples taken after fasting for at least 8 hours.   Comment 1 Notify RN   Glucose, capillary     Status: Abnormal   Collection Time: 03/21/20  7:43 AM  Result Value Ref Range   Glucose-Capillary 139 (H) 70 - 99 mg/dL    Comment: Glucose reference range applies only to samples taken after fasting for at least 8 hours.  Glucose, capillary  Status: Abnormal   Collection Time: 03/21/20 11:32 AM  Result Value Ref Range   Glucose-Capillary 154 (H) 70 - 99 mg/dL    Comment: Glucose reference range applies only to samples taken after fasting for at least 8 hours.  Glucose, capillary     Status: Abnormal   Collection Time: 03/21/20  3:45 PM  Result Value Ref Range   Glucose-Capillary 232 (H) 70 - 99 mg/dL    Comment: Glucose reference range applies only to samples taken after fasting for at least 8 hours.  Ammonia     Status: None   Collection Time: 03/21/20  5:29 PM  Result Value Ref Range   Ammonia 16 9 - 35 umol/L    Comment: Performed at [facility], [place] [address], [place], [place] [address]  TSH     Status: None   Collection Time: 03/21/20  5:29 PM  Result Value Ref Range   TSH 1.432 0.350 - 4.500 uIU/mL    Comment: Performed by a 3rd Generation assay with a functional sensitivity of <=0.01 uIU/mL. Performed at [facility], [place] [address] [place], [place] [address]   RPR     Status: None   Collection Time: 03/21/20  5:29 PM  Result Value Ref  Range   RPR Ser Ql NON REACTIVE NON REACTIVE    Comment: Performed at [place] [address] [place], [place] [address]  Glucose, capillary     Status: Abnormal   Collection Time: 03/21/20  8:57 PM  Result Value Ref Range   Glucose-Capillary 183 (H) 70 - 99 mg/dL    Comment: Glucose reference range applies only to samples taken after fasting for at least 8 hours.  Glucose, capillary     Status: Abnormal   Collection Time: 03/22/20 12:01 AM  Result Value Ref Range   Glucose-Capillary 170 (H) 70 - 99 mg/dL    Comment: Glucose reference range applies only to samples taken after fasting for at least 8 hours.  Glucose, capillary     Status: Abnormal   Collection Time: 03/22/20  4:16 AM  Result Value Ref Range   Glucose-Capillary 139 (H) 70 - 99 mg/dL    Comment: Glucose reference range applies only to samples taken after fasting for at least 8 hours.  CBC     Status: Abnormal   Collection Time: 03/22/20  6:54 AM  Result Value Ref Range   WBC 10.3 4.0 - 10.5 K/uL   RBC 3.84 (L) 4.22 - 5.81 MIL/uL   Hemoglobin 11.6 (L) 13.0 - 17.0 g/dL   HCT 36.2 (L) 39 - 52 %   MCV 94.3 80.0 - 100.0 fL   MCH 30.2 26.0 - 34.0 pg   MCHC 32.0 30.0 - 36.0 g/dL   RDW 13.3 11.5 - 15.5 %   Platelets 347 150 - 400 K/uL   nRBC 0.0 0.0 - 0.2 %    Comment: Performed at [facility], [place] [address], [place], [place] [address]  Comprehensive metabolic panel     Status: Abnormal   Collection Time: 03/22/20  6:54 AM  Result Value Ref Range   Sodium 135 135 - 145 mmol/L   Potassium 3.4 (L) 3.5 - 5.1 mmol/L   Chloride 107 98 - 111 mmol/L   CO2 21 (L) 22 - 32 mmol/L   Glucose, Bld 121 (H) 70 - 99 mg/dL    Comment: Glucose reference range applies only to samples taken after fasting for at least  8 hours.   BUN 21 8 - 23 mg/dL   Creatinine, Ser 1.41 (H) 0.61 - 1.24 mg/dL   Calcium 7.8 (L) 8.9 - 10.3 mg/dL   Total Protein 6.3 (L) 6.5 - 8.1 g/dL   Albumin 2.3 (L) 3.5 - 5.0 g/dL   AST  15 15 - 41 U/L   ALT 12 0 - 44 U/L   Alkaline Phosphatase 68 38 - 126 U/L   Total Bilirubin 0.5 0.3 - 1.2 mg/dL   GFR calc non Af Amer 46 (L) >60 mL/min   GFR calc Af Amer 54 (L) >60 mL/min   Anion gap 7 5 - 15    Comment: Performed at [facility], [place] [address], [place], [place] [address]  Magnesium     Status: None   Collection Time: 03/22/20  6:54 AM  Result Value Ref Range   Magnesium 2.0 1.7 - 2.4 mg/dL    Comment: Performed at [facility], [place] [address] [place], [place] [address]  Phosphorus     Status: Abnormal   Collection Time: 03/22/20  6:54 AM  Result Value Ref Range   Phosphorus 1.6 (L) 2.5 - 4.6 mg/dL    Comment: Performed at [facility], [place] [address], [place], [place] [address]  Glucose, capillary     Status: Abnormal   Collection Time: 03/22/20  7:18 AM  Result Value Ref Range   Glucose-Capillary 108 (H) 70 - 99 mg/dL    Comment: Glucose reference range applies only to samples taken after fasting for at least 8 hours.  Glucose, capillary     Status: Abnormal   Collection Time: 03/22/20 11:54 AM  Result Value Ref Range   Glucose-Capillary 219 (H) 70 - 99 mg/dL    Comment: Glucose reference range applies only to samples taken after fasting for at least 8 hours.  Glucose, capillary     Status: Abnormal   Collection Time: 03/22/20  4:00 PM  Result Value Ref Range   Glucose-Capillary 233 (H) 70 - 99 mg/dL    Comment: Glucose reference range applies only to samples taken after fasting for at least 8 hours.    MICRO: cx -morganella- sensitivities pending Blood cx ngtd IMAGING: No results found.  HISTORICAL MICRO/IMAGING  Assessment/Plan:  84yo M with subphrenic/hepatic abscess, morganella currently identified- continue with ceftriaxone plus metronidazole  Await sensitivities to see if can find oral regimen to treat hepatic abscess  Will check sed rate and crp as well.

## 2020-03-22 NOTE — Care Management Important Message (Signed)
Important Message  Patient Details IM Letter given to the Patient Name: Mike Mack MRN: [id number] Date of Birth: 1936-03-14   Medicare Important Message Given:  Yes     Kerin Salen 03/22/2020, 2:33 PM

## 2020-03-22 NOTE — Progress Notes (Signed)
Initial Nutrition Assessment  INTERVENTION:   -Glucerna Shake po TID, each supplement provides 220 kcal and 10 grams of protein -Magic cup BID with meals, each supplement provides 290 kcal and 9 grams of protein  NUTRITION DIAGNOSIS:   Increased nutrient needs related to  (subphrenic abscess) as evidenced by estimated needs.  GOAL:   Patient will meet greater than or equal to 90% of their needs  MONITOR:   PO intake, Supplement acceptance, Weight trends, Labs, I & O's  REASON FOR ASSESSMENT:   Consult Assessment of nutrition requirement/status  ASSESSMENT:   84 year old male with history of DM-2, calculus cholecystitis status post lap chole in 12/621, complicated by gallbladder fossa abscess requiring IR drainage and IV antibiotic in 08/2013, CKD-3A, CVA, cognitive decline, HTN, post polio syndrome and neuropathy presenting with generalized weakness, poor p.o. intake and right upper quadrant pain for about 2 weeks.  10/1: admitted 10/2: s/p drain placed   Pt with h/o CVA with some cognitive decline. Unable to provide any history.   Per chart review, patient has had poor PO intake for 2 weeks now d/t decreased appetite. Pt currently consuming 25-50% of meals. For breakfast he had cheerios with milk and juice.  Will order Glucerna shakes and Magic cups with meals for additional kcals and protein.  Per weight records, pt has lost 12 lbs since 3/7 (6% wt loss x 7 months, insignificant for time frame).  Medications: Multivitamin with minerals daily, KLOR-CON, IV Mg sulfate  Labs reviewed: 108-219 Low K, Phos Mg WNL   NUTRITION - FOCUSED PHYSICAL EXAM:  Deferred.  Diet Order:   Diet Order            Diet heart healthy/carb modified Room service appropriate? Yes; Fluid consistency: Thin  Diet effective now                 EDUCATION NEEDS:   No education needs have been identified at this time  Skin:  Skin Assessment: Reviewed RN Assessment  Last BM:  10/4  -type 6  Height:   Ht Readings from Last 1 Encounters:  03/19/20 5\' 5"  (1.651 m)    Weight:   Wt Readings from Last 1 Encounters:  03/19/20 80.3 kg   BMI:  Body mass index is 29.45 kg/m.  Estimated Nutritional Needs:   Kcal:  1800-2000  Protein:  80-90g  Fluid:  2L/day   Clayton Bibles, MS, RD, LDN Inpatient Clinical Dietitian Contact information available via Amion

## 2020-03-22 NOTE — Progress Notes (Signed)
Physical Therapy Treatment Patient Details Name: Mike Mack MRN: [id number] DOB: 01-31-1936 Today's Date: 03/22/2020    History of Present Illness 84 yo male admitted with R upper quadrant pain and decreased PO intake. CT (+) 13 cm subphrenic abscess contiguity with R lobe of liver and hepatic flexure of the colon. 9/02 IR drained abscess PMH remote hx of subhepatic abscess s/p laparoscopic cholecystecomy, dementia, arthritis R leg, kidney stones, stomach ulcers, polio, HTN, SBO, CVA residual L hand numbness, DM2    PT Comments    Pt reports he is able to stand and transfer to w/c at baseline.  Pt assisted with bed mobility and transfer to recliner and requiring mod-max assist at this time.  Continue to recommend SNF upon d/c.   Follow Up Recommendations  SNF     Equipment Recommendations  None recommended by PT    Recommendations for Other Services       Precautions / Restrictions Precautions Precautions: Fall Precaution Comments: JP drain R flank    Mobility  Bed Mobility Overal bed mobility: Needs Assistance Bed Mobility: Supine to Sit     Supine to sit: Max assist;+2 for physical assistance     General bed mobility comments: pt initiated task however requiring assist for R LE (hx of polio), also required assist for trunk upright, pt able to assist with scooting to EOB with cues  Transfers Overall transfer level: Needs assistance Equipment used: Rolling walker (2 wheeled) Transfers: Sit to/from [facility] Sit to Stand: Mod assist Stand pivot transfers: Min assist       General transfer comment: assist to rise and steady, cues for small steps over to recliner, assist for weakness and stability  Ambulation/Gait             General Gait Details: pt reports he mobilizes with w/c at baseline   Stairs             Wheelchair Mobility    Modified Rankin (Stroke Patients Only)       Balance                                             Cognition Arousal/Alertness: Awake/alert Behavior During Therapy: WFL for tasks assessed/performed Overall Cognitive Status: History of cognitive impairments - at baseline                                        Exercises      General Comments        Pertinent Vitals/Pain Pain Assessment: No/denies pain Pain Intervention(s): Monitored during session;Repositioned    Home Living                      Prior Function            PT Goals (current goals can now be found in the care plan section) Progress towards PT goals: Progressing toward goals    Frequency    Min 2X/week      PT Plan Current plan remains appropriate    Co-evaluation              AM-PAC PT "6 Clicks" Mobility   Outcome Measure  Help needed turning from your back to your side while in a flat bed without using bedrails?:  A Lot Help needed moving from lying on your back to sitting on the side of a flat bed without using bedrails?: A Lot Help needed moving to and from a bed to a chair (including a wheelchair)?: A Lot Help needed standing up from a chair using your arms (e.g., wheelchair or bedside chair)?: A Lot Help needed to walk in hospital room?: Total Help needed climbing 3-5 steps with a railing? : Total 6 Click Score: 10    End of Session Equipment Utilized During Treatment: Gait belt Activity Tolerance: Patient limited by fatigue Patient left: with chair alarm set;in chair;with call bell/phone within reach   PT Visit Diagnosis: Unsteadiness on feet (R26.81);Difficulty in walking, not elsewhere classified (R26.2)     Time: 3729-0211 PT Time Calculation (min) (ACUTE ONLY): 12 min  Charges:  $Therapeutic Activity: 8-22 mins                     Jannette Spanner PT, DPT Acute Rehabilitation Services Pager: [phone] Office: [phone]  York Ram E 03/22/2020, 3:14 PM

## 2020-03-23 DIAGNOSIS — R5381 Other malaise: Secondary | ICD-10-CM | POA: Diagnosis not present

## 2020-03-23 DIAGNOSIS — K651 Peritoneal abscess: Secondary | ICD-10-CM | POA: Diagnosis not present

## 2020-03-23 DIAGNOSIS — G9341 Metabolic encephalopathy: Secondary | ICD-10-CM | POA: Diagnosis not present

## 2020-03-23 DIAGNOSIS — E113553 Type 2 diabetes mellitus with stable proliferative diabetic retinopathy, bilateral: Secondary | ICD-10-CM | POA: Diagnosis not present

## 2020-03-23 LAB — RENAL FUNCTION PANEL
Albumin: 2.2 g/dL — ABNORMAL LOW (ref 3.5–5.0)
Anion gap: 7 (ref 5–15)
BUN: 19 mg/dL (ref 8–23)
CO2: 23 mmol/L (ref 22–32)
Calcium: 8 mg/dL — ABNORMAL LOW (ref 8.9–10.3)
Chloride: 107 mmol/L (ref 98–111)
Creatinine, Ser: 1.45 mg/dL — ABNORMAL HIGH (ref 0.61–1.24)
GFR calc Af Amer: 52 mL/min — ABNORMAL LOW (ref >60)
GFR calc non Af Amer: 45 mL/min — ABNORMAL LOW (ref >60)
Glucose, Bld: 141 mg/dL — ABNORMAL HIGH (ref 70–99)
Phosphorus: 1.4 mg/dL — ABNORMAL LOW (ref 2.5–4.6)
Potassium: 3.9 mmol/L (ref 3.5–5.1)
Sodium: 137 mmol/L (ref 135–145)

## 2020-03-23 LAB — GLUCOSE, CAPILLARY
Glucose-Capillary: 116 mg/dL — ABNORMAL HIGH (ref 70–99)
Glucose-Capillary: 127 mg/dL — ABNORMAL HIGH (ref 70–99)
Glucose-Capillary: 135 mg/dL — ABNORMAL HIGH (ref 70–99)
Glucose-Capillary: 158 mg/dL — ABNORMAL HIGH (ref 70–99)
Glucose-Capillary: 160 mg/dL — ABNORMAL HIGH (ref 70–99)
Glucose-Capillary: 201 mg/dL — ABNORMAL HIGH (ref 70–99)
Glucose-Capillary: 228 mg/dL — ABNORMAL HIGH (ref 70–99)

## 2020-03-23 LAB — HEMOGLOBIN AND HEMATOCRIT, BLOOD
HCT: 32.9 % — ABNORMAL LOW (ref 39.0–52.0)
Hemoglobin: 10.6 g/dL — ABNORMAL LOW (ref 13.0–17.0)

## 2020-03-23 LAB — MAGNESIUM: Magnesium: 2.5 mg/dL — ABNORMAL HIGH (ref 1.7–2.4)

## 2020-03-23 MED ORDER — SODIUM PHOSPHATES 45 MMOLE/15ML IV SOLN
30.0000 mmol | Freq: Once | INTRAVENOUS | Status: AC
  Administered 2020-03-23: 30 mmol via INTRAVENOUS
  Filled 2020-03-23: qty 10

## 2020-03-23 MED ORDER — SULFAMETHOXAZOLE-TRIMETHOPRIM 800-160 MG PO TABS
1.0000 | ORAL_TABLET | Freq: Two times a day (BID) | ORAL | Status: DC
  Administered 2020-03-23 – 2020-03-25 (×5): 1 via ORAL
  Filled 2020-03-23 (×5): qty 1

## 2020-03-23 NOTE — Progress Notes (Signed)
PROGRESS NOTE  Mike Mack LYY:[id number] DOB: September 28, 1935   PCP: Jani Gravel, MD  Patient is from: home.  Uses walker at baseline.  DOA: 03/19/2020 LOS: 4  Brief Narrative / Interim history: 84 year old male with history of DM-2, calculus cholecystitis status post lap chole in 06/2749, complicated by gallbladder fossa abscess requiring IR drainage and IV antibiotic in 08/2013, CKD-3A, CVA, cognitive decline, HTN, post polio syndrome and neuropathy presenting with generalized weakness, poor p.o. intake and right upper quadrant pain for about 2 weeks.  In ED, hypertensive hypertensive to 187/86.  Afebrile.  WBC 9.8. Cr 1.35.  Lipase 20.  Troponin 6. UA with large Hgb, 5 ketones, rare bacteria.  CXR with small bilateral pleural effusions and atelectasis.  CT head without acute finding.  CT abdomen and pelvis with right anterior subphrenic collection measuring up to 13 x 3 cm concerning for abscess. Per radiology, concern about colonic source given proximity.  Cultures obtained.  Patient was started on IV Rocephin and Flagyl.  General surgery, GI and IR consulted.   Patient had drain placement by IR on 03/20/2020.  Surgical culture Morganella Morgagni sensitive to ceftazidime, Bactrim Zosyn, and imipenem.  Infectious disease and IR following.  Subjective: Seen and examined earlier this morning.  No major events overnight of this morning.  No complaints but not a reliable historian.  Responds no to pain.  Awake and alert.  Oriented to self and place.  He does not appear to be in distress.  Objective: Vitals:   03/22/20 1341 03/22/20 2042 03/23/20 0514 03/23/20 1002  BP: (!) 158/75 (!) 148/73 (!) 143/72 (!) 167/77  Pulse: 60 79 69 74  Resp: 20 18 17 16   Temp: 98.7 F (37.1 C) 99.8 F (37.7 C) 99.4 F (37.4 C) 98.9 F (37.2 C)  TempSrc: Oral Oral Oral Oral  SpO2: 97% 98% 98% 97%  Weight:      Height:        Intake/Output Summary (Last 24 hours) at 03/23/2020 1252 Last data filed at  03/23/2020 1222 Gross per 24 hour  Intake 1766.06 ml  Output 297.5 ml  Net 1468.56 ml   Filed Weights   03/19/20 0638 03/19/20 1002  Weight: 80.3 kg 80.3 kg    Examination: GENERAL: No apparent distress.  Nontoxic. HEENT: MMM.  Vision and hearing grossly intact.  NECK: Supple.  No apparent JVD.  RESP:  No IWOB.  Fair aeration bilaterally. CVS:  RRR. Heart sounds normal.  ABD/GI/GU: BS+. Abd soft, NTND.  JP drain to RUQ. MSK/EXT:  Moves extremities.  RLE atrophy and weakness from polio. SKIN: no apparent skin lesion or wound NEURO: Awake, alert and oriented to self and place.  Follows commands.  RLE weakness PSYCH: Calm. Normal affect.   Procedures:  03/20/2020-drain placement by IR  Microbiology summarized: COVID-19 PCR negative. Blood cultures NGTD Urine culture NGTD MRSA PCR negative Surgical culture with Morganella morganii  Assessment & Plan: Subphrenic abscess-CT abdomen and pelvis with right anterior subphrenic collection measuring up to 13 x 3 cm concerning for abscess. He has history of lap chole complicated by right hepatic and gallbladder fossa abscess in 2015.  -IR placed JP drain on 10/2.   -Drain culture with Morganella morganii sensitive to ceftazidime, imipenem, Bactrim and Zosyn -General surgery in [facility] signed off.  Recommended colonoscopy in the future -Infectious disease following-currently on IV ceftriaxone and Flagyl. -IR recommended drain irrigation, output monitoring repeat CT in 1 week  Acute metabolic encephalopathy in patient with dementia: Seems to  be waxing or waning concerning for delirium.  He is awake, alert and oriented x2 today. -Reorientation and delirium precautions. -Minimize or avoid sedating medications  Uncontrolled DM-2 complicated by chronic kidney disease: A1c 8.4%.  On Actos at home. Recent Labs  Lab 03/22/20 2005 03/23/20 0008 03/23/20 0408 03/23/20 0736 03/23/20 1132  GLUCAP 159* 158* 127* 116* 201*  -Continue  SSI-moderate  CKD-3A: Creatinine slightly up but seems to be plateauing. Lab Results  Component Value Date   CREATININE 1.45 (H) 03/23/2020   CREATININE 1.41 (H) 03/22/2020   CREATININE 1.24 03/20/2020  -Continue monitoring -Avoid nephrotoxic meds  Essential hypertension: BP slightly elevated. -Continue metoprolol to 50 mg twice daily-on 25 mg twice daily at home -Continue Avapro.  Hypokalemia/hypophosphatemia: K2.7.  P1.4.   -40 mEq of IV KCl ordered earlier this morning. -Potassium phosphate 30 mmol -P.o. KCl 20 twice daily  Hypothyroidism: TSH within normal -Continue home Synthroid.  Debility/Generlized weakness/history of left thalamic CVA/failure to Thrive:  -PT/OT to eval and treat when appropriate. Wt Readings from Last 10 Encounters:  03/19/20 80.3 kg  12/07/19 83.9 kg  11/20/19 81.6 kg  08/24/19 86.2 kg  03/23/19 83 kg  10/03/18 83.2 kg  04/18/17 77.6 kg  12/03/14 77.5 kg  10/28/14 77.3 kg  11/08/13 57.2 kg  -Consult dietitian  BPH: No urinary symptoms. -Monitor urine output -Continue home Flomax.  Body mass index is 29.45 kg/m. Nutrition Problem: Increased nutrient needs Etiology:  (subphrenic abscess) Signs/Symptoms: estimated needs Interventions: Glucerna shake, Magic cup  DVT prophylaxis:  SCDs Start: 03/19/20 1242  Code Status: Full code  Family Communication: Updated patient's wife at bedside on 10/3.  None at bedside today. Status is: Inpatient  Remains inpatient appropriate because:Ongoing diagnostic testing needed not appropriate for outpatient work up, IV treatments appropriate due to intensity of illness or inability to take PO and Inpatient level of care appropriate due to severity of illness   Dispo: The patient is from: Home              Anticipated d/c is to: SNF              Anticipated d/c date is: 1 day if cleared by ID or IR unless IR wants repeat CT abdomen done before discharge.              Patient currently is not  medically stable to d/c.       Consultants:  General surgery-signed off [facility]-signed off Interventional radiology Infectious disease    Sch Meds:  Scheduled Meds: . aspirin EC  81 mg Oral Daily  . atorvastatin  20 mg Oral QPM  . brimonidine  1 drop Both Eyes BID  . Chlorhexidine Gluconate Cloth  6 each Topical Daily  . dorzolamide-timolol  1 drop Both Eyes BID  . feeding supplement (GLUCERNA SHAKE)  237 mL Oral TID BM  . gabapentin  100 mg Oral BID  . insulin aspart  0-15 Units Subcutaneous Q4H  . irbesartan  150 mg Oral Daily  . latanoprost  1 drop Both Eyes QHS  . levothyroxine  88 mcg Oral Q0600  . metoprolol tartrate  50 mg Oral BID  . multivitamin with minerals  1 tablet Oral Daily  . Netarsudil Dimesylate  1 drop Both Eyes QHS  . sodium chloride flush  5 mL Intracatheter Q8H  . tamsulosin  0.4 mg Oral BID   Continuous Infusions: . sodium chloride Stopped (03/22/20 0008)  . cefTRIAXone (ROCEPHIN)  IV 2 g (03/22/20  1831)  . metronidazole 500 mg (03/23/20 0559)  . sodium phosphate  Dextrose 5% IVPB 30 mmol (03/23/20 1003)   PRN Meds:.sodium chloride, hydrALAZINE  Antimicrobials: Anti-infectives (From admission, onward)   Start     Dose/Rate Route Frequency Ordered Stop   03/19/20 2000  metroNIDAZOLE (FLAGYL) IVPB 500 mg        500 mg 100 mL/hr over 60 Minutes Intravenous Every 8 hours 03/19/20 1812     03/19/20 2000  cefTRIAXone (ROCEPHIN) 2 g in sodium chloride 0.9 % 100 mL IVPB        2 g 200 mL/hr over 30 Minutes Intravenous Daily-1800 03/19/20 1830     03/19/20 1815  Ampicillin-Sulbactam (UNASYN) 3 g in sodium chloride 0.9 % 100 mL IVPB  Status:  Discontinued        3 g 200 mL/hr over 30 Minutes Intravenous Every 6 hours 03/19/20 1812 03/19/20 1830       I have personally reviewed the following labs and images: CBC: Recent Labs  Lab 03/19/20 0644 03/20/20 0536 03/22/20 0654 03/23/20 0409  WBC 9.8 10.1 10.3  --   NEUTROABS 7.5  --   --   --    HGB 13.1 13.0 11.6* 10.6*  HCT 38.8* 40.3 36.2* 32.9*  MCV 92.4 92.4 94.3  --   PLT 417* 383 347  --    BMP &GFR Recent Labs  Lab 03/19/20 0641 03/20/20 0536 03/22/20 0654 03/23/20 0409  NA 138 134* 135 137  K 4.4 4.3 3.4* 3.9  CL 103 101 107 107  CO2 24 22 21* 23  GLUCOSE 111* 107* 121* 141*  BUN 12 12 21 19   CREATININE 1.35* 1.24 1.41* 1.45*  CALCIUM 8.6* 8.6* 7.8* 8.0*  MG  --   --  2.0 2.5*  PHOS  --   --  1.6* 1.4*   Estimated Creatinine Clearance: 39 mL/min (A) (by C-G formula based on SCr of 1.45 mg/dL (H)). Liver & Pancreas: Recent Labs  Lab 03/19/20 0641 03/20/20 0536 03/22/20 0654 03/23/20 0409  AST 13* 15 15  --   ALT 13 14 12   --   ALKPHOS 88 86 68  --   BILITOT 0.8 1.1 0.5  --   PROT 7.6 7.4 6.3*  --   ALBUMIN 2.7* 2.6* 2.3* 2.2*   Recent Labs  Lab 03/19/20 0715  LIPASE 20   Recent Labs  Lab 03/21/20 1729  AMMONIA 16   Diabetic: No results for input(s): HGBA1C in the last 72 hours. Recent Labs  Lab 03/22/20 2005 03/23/20 0008 03/23/20 0408 03/23/20 0736 03/23/20 1132  GLUCAP 159* 158* 127* 116* 201*   Cardiac Enzymes: No results for input(s): CKTOTAL, CKMB, CKMBINDEX, TROPONINI in the last 168 hours. No results for input(s): PROBNP in the last 8760 hours. Coagulation Profile: Recent Labs  Lab 03/19/20 1654  INR 1.3*   Thyroid Function Tests: Recent Labs    03/21/20 1729  TSH 1.432   Lipid Profile: No results for input(s): CHOL, HDL, LDLCALC, TRIG, CHOLHDL, LDLDIRECT in the last 72 hours. Anemia Panel: No results for input(s): VITAMINB12, FOLATE, FERRITIN, TIBC, IRON, RETICCTPCT in the last 72 hours. Urine analysis:    Component Value Date/Time   COLORURINE YELLOW 03/19/2020 0933   APPEARANCEUR CLEAR 03/19/2020 0933   LABSPEC 1.013 03/19/2020 0933   PHURINE 7.0 03/19/2020 0933   GLUCOSEU NEGATIVE 03/19/2020 0933   HGBUR LARGE (A) 03/19/2020 0933   BILIRUBINUR NEGATIVE 03/19/2020 0933   KETONESUR 5 (A) 03/19/2020  9242  PROTEINUR 100 (A) 03/19/2020 0933   UROBILINOGEN 0.2 10/25/2014 1520   NITRITE NEGATIVE 03/19/2020 0933   LEUKOCYTESUR SMALL (A) 03/19/2020 0933   Sepsis Labs: Invalid input(s): PROCALCITONIN, [place]  Microbiology: Recent Results (from the past 240 hour(s))  Respiratory Panel by RT PCR (Flu A&B, Covid) - Nasopharyngeal Swab     Status: None   Collection Time: 03/19/20  7:15 AM   Specimen: Nasopharyngeal Swab  Result Value Ref Range Status   SARS Coronavirus 2 by RT PCR NEGATIVE NEGATIVE Final    Comment: (NOTE) SARS-CoV-2 target nucleic acids are NOT DETECTED.  The SARS-CoV-2 RNA is generally detectable in upper respiratoy specimens during the acute phase of infection. The lowest concentration of SARS-CoV-2 viral copies this assay can detect is 131 copies/mL. A negative result does not preclude SARS-Cov-2 infection and should not be used as the sole basis for treatment or other patient management decisions. A negative result may occur with  improper specimen collection/handling, submission of specimen other than nasopharyngeal swab, presence of viral mutation(s) within the areas targeted by this assay, and inadequate number of viral copies (<131 copies/mL). A negative result must be combined with clinical observations, patient history, and epidemiological information. The expected result is Negative.  Fact Sheet for Patients:  PinkCheek.be  Fact Sheet for Healthcare Providers:  GravelBags.it  This test is no t yet approved or cleared by the [place] FDA and  has been authorized for detection and/or diagnosis of SARS-CoV-2 by FDA under an Emergency Use Authorization (EUA). This EUA will remain  in effect (meaning this test can be used) for the duration of the COVID-19 declaration under Section 564(b)(1) of the Act, 21 U.S.C. section 360bbb-3(b)(1), unless the authorization is terminated or revoked  sooner.     Influenza A by PCR NEGATIVE NEGATIVE Final   Influenza B by PCR NEGATIVE NEGATIVE Final    Comment: (NOTE) The Xpert Xpress SARS-CoV-2/FLU/RSV assay is intended as an aid in  the diagnosis of influenza from Nasopharyngeal swab specimens and  should not be used as a sole basis for treatment. Nasal washings and  aspirates are unacceptable for Xpert Xpress SARS-CoV-2/FLU/RSV  testing.  Fact Sheet for Patients: PinkCheek.be  Fact Sheet for Healthcare Providers: GravelBags.it  This test is not yet approved or cleared by the [place] FDA and  has been authorized for detection and/or diagnosis of SARS-CoV-2 by  FDA under an Emergency Use Authorization (EUA). This EUA will remain  in effect (meaning this test can be used) for the duration of the  Covid-19 declaration under Section 564(b)(1) of the Act, 21  U.S.C. section 360bbb-3(b)(1), unless the authorization is  terminated or revoked. Performed at [facility], [place] [address], [place], [place] [address]   Urine culture     Status: None   Collection Time: 03/19/20  9:33 AM   Specimen: In/Out Cath Urine  Result Value Ref Range Status   Specimen Description   Final    IN/OUT CATH URINE Performed at [place] [address] [place], [place] [address]    Special Requests   Final    NONE Performed at [facility], [place] [address] [place], [place] [address]    Culture   Final    NO GROWTH Performed at [facility], [place] [address], [place], [place] [address]    Report Status 03/20/2020 FINAL  Final  Culture, blood (routine x 2)     Status: None (Preliminary result)  Collection Time: 03/19/20 11:30 AM   Specimen: BLOOD LEFT HAND  Result Value Ref Range Status   Specimen Description   Final    BLOOD LEFT HAND Performed at [place] [address] [place], [place]  [address]    Special Requests   Final    BOTTLES DRAWN AEROBIC AND ANAEROBIC Blood Culture adequate volume Performed at [facility] [address], [place], [place] [address]    Culture   Final    NO GROWTH 4 DAYS Performed at [facility], [place] [address], [place], [place] [address]    Report Status PENDING  Incomplete  Culture, blood (routine x 2)     Status: None (Preliminary result)   Collection Time: 03/19/20 11:45 AM   Specimen: BLOOD RIGHT HAND  Result Value Ref Range Status   Specimen Description   Final    BLOOD RIGHT HAND Performed at [place] [address], [place], [place] [address]    Special Requests   Final    BOTTLES DRAWN AEROBIC AND ANAEROBIC Blood Culture adequate volume Performed at [place] [address], [place], [place] [address]    Culture   Final    NO GROWTH 4 DAYS Performed at [facility], [place] [address] [place], [place] [address]    Report Status PENDING  Incomplete  MRSA PCR Screening     Status: None   Collection Time: 03/20/20  5:37 AM   Specimen: Nasal Mucosa; Nasopharyngeal  Result Value Ref Range Status   MRSA by PCR NEGATIVE NEGATIVE Final    Comment:        The GeneXpert MRSA Assay (FDA approved for NASAL specimens only), is one component of a comprehensive MRSA colonization surveillance program. It is not intended to diagnose MRSA infection nor to guide or monitor treatment for MRSA infections. Performed at [facility], [place] [address], [place], [place] [address]   Aerobic/Anaerobic Culture (surgical/deep wound)     Status: None (Preliminary result)   Collection Time: 03/20/20  4:09 PM   Specimen: Abscess  Result Value Ref Range Status   Specimen Description ABSCESS  Final   Special Requests ABDOMEN  Final   Gram Stain   Final    ABUNDANT WBC PRESENT, PREDOMINANTLY PMN NO ORGANISMS SEEN    Culture   Final    FEW MORGANELLA  MORGANII NO ANAEROBES ISOLATED; CULTURE IN PROGRESS FOR 5 DAYS    Report Status PENDING  Incomplete   Organism ID, Bacteria MORGANELLA MORGANII  Final      Susceptibility   Morganella morganii - MIC*    AMPICILLIN >=32 RESISTANT Resistant     CEFAZOLIN >=64 RESISTANT Resistant     CEFTAZIDIME 8 SENSITIVE Sensitive     CIPROFLOXACIN >=4 RESISTANT Resistant     GENTAMICIN <=1 SENSITIVE Sensitive     IMIPENEM 1 SENSITIVE Sensitive     TRIMETH/SULFA <=20 SENSITIVE Sensitive     AMPICILLIN/SULBACTAM >=32 RESISTANT Resistant     PIP/TAZO Value in next row Sensitive      <=4 SENSITIVEPerformed at [facility], [address] [place], [place] [address]    * FEW MORGANELLA MORGANII    Radiology Studies: No results found.    Deletha Jaffee T. [place]  If 7PM-7AM, please contact night-coverage [email or web address] 03/23/2020, 12:52 PM

## 2020-03-23 NOTE — Progress Notes (Signed)
Referring Physician(s): Dr. Domenica Reamer  Supervising Physician: Jacqulynn Cadet  Patient Status:  [facility] - In-pt  Chief Complaint:  Abdominal pain s/p subphrenic abscess drain placement on 10.3.21 by Dr. Earleen Newport  Subjective:  Patient denies any abdominal pain, nausea or vomiting at this time.   Allergies: Metformin and related, Other, and Soliqua [insulin glargine-lixisenatide]  Medications: Prior to Admission medications   Medication Sig Start Date End Date Taking? Authorizing Provider  aspirin EC 81 MG tablet Take 81 mg by mouth daily.   Yes [provider]  atorvastatin (LIPITOR) 20 MG tablet Take 20 mg by mouth every evening.  11/03/13  Yes [provider]  B Complex Vitamins (B COMPLEX PO) Take 1 tablet by mouth daily.   Yes [provider]  brimonidine (ALPHAGAN) 0.2 % ophthalmic solution Place 1 drop into both eyes 2 (two) times daily. 12/28/19  Yes [provider]  cholecalciferol (VITAMIN D-400) 10 MCG (400 UNIT) TABS tablet Take 400 Units by mouth daily.   Yes [provider]  dorzolamide-timolol (COSOPT) 22.3-6.8 MG/ML ophthalmic solution Place 1 drop into both eyes 2 (two) times daily. 03/04/20  Yes [provider]  gabapentin (NEURONTIN) 600 MG tablet Take 600 mg by mouth 2 (two) times daily. 01/20/20  Yes [provider]  insulin detemir (LEVEMIR) 100 UNIT/ML injection Inject 16 Units into the skin 2 (two) times daily.    Yes [provider]  latanoprost (XALATAN) 0.005 % ophthalmic solution Place 1 drop into both eyes at bedtime.  11/17/14  Yes [provider]  levothyroxine (SYNTHROID) 88 MCG tablet Take 88 mcg by mouth at bedtime.  04/21/11  Yes [provider]  metoprolol tartrate (LOPRESSOR) 25 MG tablet Take 25 mg by mouth 2 (two) times daily.   Yes [provider]  olmesartan (BENICAR) 20 MG tablet Take 20 mg by mouth daily. 02/20/20  Yes [provider]   pioglitazone (ACTOS) 30 MG tablet Take 15 mg by mouth daily. 03/04/20  Yes [provider]  RHOPRESSA 0.02 % SOLN Place 1 drop into both eyes at bedtime. 03/05/20  Yes [provider]  Tamsulosin HCl (FLOMAX) 0.4 MG CAPS Take 0.4 mg by mouth 2 (two) times daily.    Yes [provider]  glucose blood test strip  04/04/15   [provider]  Multiple Vitamin (MULTIVITAMIN WITH MINERALS) TABS tablet Take 1 tablet by mouth daily. Patient not taking: Reported on 03/23/2019 09/06/13   [name], MD  ondansetron (ZOFRAN ODT) 4 MG disintegrating tablet 4mg  ODT q4 hours prn nausea/vomit Patient not taking: Reported on 11/12/2019 08/24/19   Drenda Freeze, MD  polyethylene glycol [facility] / Floria Raveling) packet Take 17 g by mouth daily. Patient not taking: Reported on 11/12/2019 09/06/13   [name], MD     Vital Signs: BP 131/66 (BP Location: Left Arm)   Pulse (!) 58   Temp 98.9 F (37.2 C) (Oral)   Resp 20   Ht 5\' 5"  (1.651 m)   Wt 177 lb (80.3 kg)   SpO2 98%   BMI 29.45 kg/m   Physical Exam Vitals and nursing note reviewed.  Constitutional:      Appearance: He is well-developed.  HENT:     Head: Normocephalic.  Pulmonary:     Effort: Pulmonary effort is normal.  Abdominal:     Comments: Positive RUQ drain  to suction. Site is unremarkable with no erythema, edema, tenderness, bleeding or drainage noted at exit  site. Suture and stat lock in place. Dressing is clean dry and intact. 3 ml of  Pink tinged turbid colored fluid noted in bulb suction device. Per RN at bedside drain is able to be flushed easily.  Musculoskeletal:        General: Normal range of motion.     Cervical back: Normal range of motion.  Skin:    General: Skin is dry.  Neurological:     Mental Status: He is alert and oriented to person, place, and time.     Imaging: CT IMAGE GUIDED DRAINAGE BY PERCUTANEOUS CATHETER  Result Date: 03/21/2020 INDICATION: 84 year old  male with a history subphrenic abscess EXAM: CT GUIDED DRAINAGE OF  ABSCESS MEDICATIONS: The patient is currently admitted to the hospital and receiving intravenous antibiotics. The antibiotics were administered within an appropriate time frame prior to the initiation of the procedure. ANESTHESIA/SEDATION: 0.25 mg IV Versed 50 mcg IV Fentanyl Moderate Sedation Time:  10 minutes The patient was continuously monitored during the procedure by the interventional radiology nurse under my direct supervision. COMPLICATIONS: None TECHNIQUE: Informed written consent was obtained from the patient after a thorough discussion of the procedural risks, benefits and alternatives. All questions were addressed. Maximal Sterile Barrier Technique was utilized including caps, mask, sterile gowns, sterile gloves, sterile drape, hand hygiene and skin antiseptic. A timeout was performed prior to the initiation of the procedure. PROCEDURE: The operative field was prepped with Chlorhexidine in a sterile fashion, and a sterile drape was applied covering the operative field. A sterile gown and sterile gloves were used for the procedure. Local anesthesia was provided with 1% Lidocaine. Patient was positioned supine position on CT gantry table. Scout CT was acquired for planning purposes. The patient was then prepped and draped in the usual sterile fashion. 1% lidocaine was used for local anesthesia. Using CT guidance, trocar needle was advanced into the right upper quadrant abscess. Once we aspirated purulent material, modified Seldinger technique was used to place a 10 [place] drain. Proximally 50 cc of purulent material aspirated through the pigtail catheter. Ten [place] drain was sutured in place and attached to bulb suction. Final CT image was acquired. Patient tolerated the procedure well and remained hemodynamically stable throughout. No complications were encountered and no significant blood loss. FINDINGS: Right subphrenic abscess,  similar to the comparison. Pigtail drainage catheter into the collection with approximately 50 cc of purulent material aspirated. IMPRESSION: Status post CT-guided drainage of right upper quadrant abscess. Signed, Dulcy Fanny. Dellia Nims, RPVI [facility] Radiology Electronically Signed   By: Corrie Mckusick D.O.   On: 03/21/2020 04:55    Labs:  CBC: Recent Labs    02/05/20 0421 02/05/20 0421 03/19/20 1829 03/20/20 0536 03/22/20 0654 03/23/20 0409  WBC 7.7  --  9.8 10.1 10.3  --   HGB 14.2   < > 13.1 13.0 11.6* 10.6*  HCT 43.7   < > 38.8* 40.3 36.2* 32.9*  PLT 264  --  417* 383 347  --    < > = values in this interval not displayed.    COAGS: Recent Labs    03/19/20 1654  INR 1.3*    BMP: Recent Labs    03/19/20 0641 03/20/20 0536 03/22/20 0654 03/23/20 0409  NA 138 134* 135 137  K 4.4 4.3 3.4* 3.9  CL 103 101 107 107  CO2 24 22 21* 23  GLUCOSE 111* 107* 121* 141*  BUN 12 12 21 19   CALCIUM 8.6*  8.6* 7.8* 8.0*  CREATININE 1.35* 1.24 1.41* 1.45*  GFRNONAA 49* 54* 46* 45*  GFRAA 57* >60 54* 52*    LIVER FUNCTION TESTS: Recent Labs    02/05/20 0421 02/05/20 0421 03/19/20 0641 03/20/20 0536 03/22/20 0654 03/23/20 0409  BILITOT 0.6  --  0.8 1.1 0.5  --   AST 19  --  13* 15 15  --   ALT 15  --  13 14 12   --   ALKPHOS 112  --  88 86 68  --   PROT 7.7  --  7.6 7.4 6.3*  --   ALBUMIN 3.4*   < > 2.7* 2.6* 2.3* 2.2*   < > = values in this interval not displayed.    Assessment and Plan:  84 y.o. male inpatient. History of childhood polio, DM, dementia, cholecystomy in 5449 complicated by a abscess in the gallbladder fossa s/p drain placement in 2015. Presented to the ED at [facility] with RUQ pain found to have a subphrenic abscess with possible cooling origin. IR placed  10 Fr drain with aspiration of 50 ml of purulent fluid. Per Epic output is 10 ml, 22.5 ml, 15 ml. Positive RUQ drain  to suction. Site is unremarkable with no  erythema, edema, tenderness, bleeding or drainage noted at exit site. Suture and stat lock in place. Dressing is clean dry and intact. 3 ml of  Pink tinged turbid colored fluid noted in bulb suction device. Per RN at bedside drain is able to be flushed easily.  No additional images since placement No leukocytosis. Patient is afebrile. Per note from Dr. Alfonso Patten. Buccini dated 10.4.21. I have recommended, and she is agreeable, that for now, we do not plan to put him through a colonoscopy.  Instead, we would consider that if the percutaneous drain fails to clear the abscess, or if the abscess recurs.  Cultures revealled morganella mogagni. Cr 1.45  Recommend team continue with flushing TID, output recording q shift and dressing changes as needed. Would consider additional imaging when output is less than 10 ml for 24 hours not including flush material.   Continue current treatment plans as per Primary Team and ID. IR to continue to follow  Electronically Signed: Jacqualine Mau, NP 03/23/2020, 2:59 PM   I spent a total of 15 Minutes at the patient's bedside AND on the patient's hospital floor or unit, greater than 50% of which was counseling/coordinating care for subphrenic abscess drain placement.

## 2020-03-23 NOTE — Progress Notes (Signed)
[facility] for Infectious Disease    Date of Admission:  03/19/2020   Total days of antibiotics 5           ID: Mike Mack is a 84 y.o. male with intra abdominal abscess Principal Problem:   Subphrenic abscess ([place]) Active Problems:   RUQ abdominal pain   Leukocytosis   Generalized weakness   Debility   Controlled type 2 diabetes mellitus with stable proliferative retinopathy of both eyes, with long-term current use of insulin (HCC)   Failure to thrive (child)    Subjective: Afebrile, having loose stools but no frequent no abdominal cramping  Medications:  . aspirin EC  81 mg Oral Daily  . atorvastatin  20 mg Oral QPM  . brimonidine  1 drop Both Eyes BID  . Chlorhexidine Gluconate Cloth  6 each Topical Daily  . dorzolamide-timolol  1 drop Both Eyes BID  . feeding supplement (GLUCERNA SHAKE)  237 mL Oral TID BM  . gabapentin  100 mg Oral BID  . insulin aspart  0-15 Units Subcutaneous Q4H  . irbesartan  150 mg Oral Daily  . latanoprost  1 drop Both Eyes QHS  . levothyroxine  88 mcg Oral Q0600  . metoprolol tartrate  50 mg Oral BID  . multivitamin with minerals  1 tablet Oral Daily  . Netarsudil Dimesylate  1 drop Both Eyes QHS  . sodium chloride flush  5 mL Intracatheter Q8H  . sulfamethoxazole-trimethoprim  1 tablet Oral Q12H  . tamsulosin  0.4 mg Oral BID    Objective: Vital signs in last 24 hours: Temp:  [98.9 F (37.2 C)-99.8 F (37.7 C)] 98.9 F (37.2 C) (10/05 1002) Pulse Rate:  [58-79] 58 (10/05 1305) Resp:  [16-20] 20 (10/05 1305) BP: (131-167)/(66-77) 131/66 (10/05 1305) SpO2:  [97 %-98 %] 98 % (10/05 1305)  Physical Exam  Constitutional: He is oriented to person, place, and time. He appears chronically ill and well-nourished. No distress.  HENT:  Mouth/Throat: Oropharynx is clear and moist. No oropharyngeal exudate.  Cardiovascular: Normal rate, regular rhythm and normal heart sounds. Exam reveals no gallop and no friction rub.  No murmur  heard.  Pulmonary/Chest: Effort normal and breath sounds normal. No respiratory distress. He has no wheezes.  Abdominal: Soft. Bowel sounds are normal. He exhibits no distension. There is no tenderness. Right jp drain with serous fluid in bulb Lymphadenopathy:  He has no cervical adenopathy.  Neurological: He is alert and oriented to person, place, and time.  Skin: Skin is warm and dry. No rash noted. No erythema.  Psychiatric: He has a normal mood and affect. His behavior is normal.     Lab Results Recent Labs    03/22/20 0654 03/23/20 0409  WBC 10.3  --   HGB 11.6* 10.6*  HCT 36.2* 32.9*  NA 135 137  K 3.4* 3.9  CL 107 107  CO2 21* 23  BUN 21 19  CREATININE 1.41* 1.45*   Liver Panel Recent Labs    03/22/20 0654 03/23/20 0409  PROT 6.3*  --   ALBUMIN 2.3* 2.2*  AST 15  --   ALT 12  --   ALKPHOS 68  --   BILITOT 0.5  --     Microbiology: Morganella morganii      MIC    AMPICILLIN >=32 RESIST... Resistant    AMPICILLIN/SULBACTAM >=32 RESIST... Resistant    CEFAZOLIN >=64 RESIST... Resistant    CEFTAZIDIME 8 SENSITIVE  Sensitive    CIPROFLOXACIN >=  4 RESISTANT  Resistant    GENTAMICIN <=1 SENSITIVE  Sensitive    IMIPENEM 1 SENSITIVE  Sensitive    PIP/TAZO  Sensitive 1    TRIMETH/SULFA <=20 SENSIT... Sensitive     Studies/Results: No results found.   Assessment/Plan: Morganella subphrenic abscess = plan to treat with oral bactrim ds 1 tab bid x 14 day. Will discontinue iv abtx. Defer to IR when drain is to be removed. Please repeat imaging at time of drain removal to see size of fluid collection. Will check sed rate and crp today.  Will sign off  [facility] for Infectious Diseases Cell: [phone] Pager: [phone]  03/23/2020, 6:00 PM

## 2020-03-23 NOTE — TOC Initial Note (Addendum)
Transition of Care [facility]) - Initial/Assessment Note    Patient Details  Name: Mike Mack MRN: [id number] Date of Birth: 1935-11-15  Transition of [facility]) CM/SW Contact:    Lia Hopping, [place] Phone Number: 03/23/2020, 10:52 AM  Clinical Narrative:                 CSW met with the patient spouse at bedside to discuss patient discharge to SNF, for a short rehab stay. Spouse reports the patient went to a SNF many years and was successful with therapy. She reports the patient walks with a walker and is usually independent with baths and getting dressed. She reports at times the patient does get weak and she helps him at that time. She reports the patient adult children are also involved in his care.  CSW explain the SNF process and provided a list of bed offers. Spouse chose [facility].   CSW reached out to SNF staff and confirm bed availability.   TOC staff will continue to follow this patient.   Expected Discharge Plan: Skilled Nursing Facility Barriers to Discharge: No Barriers Identified   Patient Goals and CMS Choice Patient states their goals for this hospitalization and ongoing recovery are:: go to rehab before returning home CMS Medicare.gov Compare Post Acute Care list provided to:: Patient Choice offered to / list presented to : Patient  Expected Discharge Plan and Services Expected Discharge Plan: [place] In-house Referral: Clinical Social Work   Post Acute Care Choice: [place] Living arrangements for the past 2 months: [place]                                      Prior Living Arrangements/Services Living arrangements for the past 2 months: Single Family Home Lives with:: Spouse Patient language and need for interpreter reviewed:: No Do you feel safe going back to the place where you live?: Yes      Need for Family Participation in Patient Care: Yes (Comment) Care giver support system in place?:  Yes (comment) Current home services: DME Criminal Activity/Legal Involvement Pertinent to Current Situation/Hospitalization: No - Comment as needed  Activities of Daily Living Home Assistive Devices/Equipment: Cane (specify quad or straight), Eyeglasses, Walker (specify type), Dentures (specify type) (upper/lower dentures, single point cane, front wheeled walker) ADL Screening (condition at time of admission) Patient's cognitive ability adequate to safely complete daily activities?: No Is the patient deaf or have difficulty hearing?: Yes Does the patient have difficulty seeing, even when wearing glasses/contacts?: Yes Does the patient have difficulty concentrating, remembering, or making decisions?: Yes Patient able to express need for assistance with ADLs?: No Does the patient have difficulty dressing or bathing?: Yes Independently performs ADLs?: No Communication: Independent Dressing (OT): Needs assistance Is this a change from baseline?: Change from baseline, expected to last >3 days Grooming: Needs assistance Is this a change from baseline?: Change from baseline, expected to last >3 days Feeding: Needs assistance Is this a change from baseline?: Change from baseline, expected to last >3 days Bathing: Needs assistance Is this a change from baseline?: Change from baseline, expected to last >3 days Toileting: Needs assistance Is this a change from baseline?: Change from baseline, expected to last >3days In/Out Bed: Needs assistance Is this a change from baseline?: Change from baseline, expected to last >3 days Walks in Home: Needs assistance Is this a change from baseline?: Change  from baseline, expected to last >3 days Does the patient have difficulty walking or climbing stairs?: Yes Weakness of Legs: Both Weakness of Arms/Hands: Both  Permission Sought/Granted Permission sought to share information with : Facility Sport and exercise psychologist, Family Supports Permission granted to  share information with : Yes, Verbal Permission Granted  Share Information with NAME: Berton, [place]  Permission granted to share info w AGENCY: SNF's in the area  Permission granted to share info w Relationship: Spouse  Permission granted to share info w Contact Information: [phone]  Emotional Assessment Appearance:: Appears stated age Attitude/Demeanor/Rapport: Unable to Assess Affect (typically observed): Unable to Assess Orientation: : Oriented to Self Alcohol / Substance Use: Not Applicable Psych Involvement: No (comment)  Admission diagnosis:  Abscess [L02.91] Delirium [R41.0] Subphrenic abscess ([place]) [K65.1] Patient Active Problem List   Diagnosis Date Noted  . Subphrenic abscess ([place]) 03/19/2020  . Failure to thrive (child) 03/19/2020  . Posterior vitreous detachment of both eyes 12/11/2019  . Syncope 10/25/2014  . AKI (acute kidney injury) ([place]) 10/25/2014  . H/O: CVA (cerebrovascular accident) 10/25/2014  . H/O poliomyelitis 10/25/2014  . Controlled type 2 diabetes mellitus with stable proliferative retinopathy of both eyes, with long-term current use of insulin ([place]) 10/25/2014  . Tachycardia 08/29/2013  . Cough 08/29/2013  . Monoparesis of leg ([place]) 08/20/2013  . Muscle weakness of lower extremity 08/20/2013  . Right upper quadrant abdominal abscess ([place]) 08/15/2013  . Hepatic abscess 08/15/2013  . Subacute delirium 08/14/2013  . Hyperglycemia 08/14/2013  . Oral thrush 08/14/2013  . Leukocytosis, unspecified 08/14/2013  . Debility 08/14/2013  . S/P cholecystectomy 07/29/2013  . Cholecystitis with cholelithiasis 07/25/2013  . Arthritis 06/06/2013  . Headache(784.0) 06/06/2013  . Headache 06/06/2013  . Generalized weakness 05/24/2013  . Cholecystitis, acute 05/24/2013  . RUQ abdominal pain 05/13/2013  . Acute cholecystitis: Probable 05/13/2013  . Constipation 05/13/2013  . Enterococcus UTI 05/13/2013  . Leukocytosis 05/13/2013  . Anemia 05/13/2013   . Acute left thalamic CVA (cerebral infarction) 04/10/2012  . Hypertension   . BPH (benign prostatic hyperplasia)   . Pruritic disorder 02/02/2011   PCP:  Jani Gravel, MD Pharmacy:   [id number] [place] [place]Phone: [phone]: [phone]    Social Determinants of Health (SDOH) Interventions    Readmission Risk Interventions No flowsheet data found.

## 2020-03-23 NOTE — Progress Notes (Signed)
Occupational Therapy Treatment Patient Details Name: Mike Mack MRN: [id number] DOB: 19-Nov-1935 Today's Date: 03/23/2020    History of present illness 84 yo male admitted with R upper quadrant pain and decreased PO intake. CT (+) 13 cm subphrenic abscess contiguity with R lobe of liver and hepatic flexure of the colon. 9/02 IR drained abscess PMH remote hx of subhepatic abscess s/p laparoscopic cholecystecomy, dementia, arthritis R leg, kidney stones, stomach ulcers, polio, HTN, SBO, CVA residual L hand numbness, DM2   OT comments  Pt very willing to sit EOB but did fatigue quickly  Follow Up Recommendations  SNF          Precautions / Restrictions Precautions Precautions: Fall Precaution Comments: JP drain R flank       Mobility Bed Mobility Overal bed mobility: Needs Assistance Bed Mobility: Supine to Sit;Sit to Supine     Supine to sit: Max assist;+2 for physical assistance Sit to supine: +2 for physical assistance;Max assist      Transfers         Stand pivot transfers: Min assist       General transfer comment: did not stand this day    Balance Overall balance assessment: Needs assistance Sitting-balance support: Bilateral upper extremity supported;Feet supported Sitting balance-Leahy Scale: Poor     Standing balance support: Bilateral upper extremity supported;During functional activity Standing balance-Leahy Scale: Poor                             ADL either performed or assessed with clinical judgement   ADL Overall ADL's : Needs assistance/impaired     Grooming: Wash/dry face;Sitting;Minimal assistance                                 General ADL Comments: pt sat EOB with OT for grooming task and did take pills with nurse.  Pt liked sitting up but did fatigue quickly.               Cognition Arousal/Alertness: Awake/alert Behavior During Therapy: WFL for tasks assessed/performed Overall Cognitive Status:  History of cognitive impairments - at baseline                                                     Pertinent Vitals/ Pain       Pain Assessment: No/denies pain         Frequency  Min 2X/week        Progress Toward Goals  OT Goals(current goals can now be found in the care plan section)  Progress towards OT goals: Progressing toward goals     Plan Discharge plan remains appropriate       AM-PAC OT "6 Clicks" Daily Activity     Outcome Measure   Help from another person eating meals?: A Little Help from another person taking care of personal grooming?: A Little   Help from another person bathing (including washing, rinsing, drying)?: Total Help from another person to put on and taking off regular upper body clothing?: A Lot Help from another person to put on and taking off regular lower body clothing?: Total 6 Click Score: 10    End of Session    OT Visit Diagnosis: Unsteadiness on feet (R26.81);Muscle  weakness (generalized) (M62.81)   Activity Tolerance Patient limited by fatigue   Patient Left in bed;with call bell/phone within reach;with nursing/sitter in room   Nurse Communication Mobility status        Time: 9842-1031 OT Time Calculation (min): 17 min  Charges: OT General Charges $OT Visit: 1 Visit OT Treatments $Therapeutic Activity: 8-22 mins  Kari Baars, [place] [phone] Office- [phone]      Dhruvan Gullion, Edwena Felty D 03/23/2020, 4:53 PM

## 2020-03-23 NOTE — Plan of Care (Signed)

## 2020-03-24 DIAGNOSIS — K651 Peritoneal abscess: Secondary | ICD-10-CM | POA: Diagnosis not present

## 2020-03-24 LAB — CULTURE, BLOOD (ROUTINE X 2)
Culture: NO GROWTH
Culture: NO GROWTH
Special Requests: ADEQUATE
Special Requests: ADEQUATE

## 2020-03-24 LAB — RENAL FUNCTION PANEL
Albumin: 2.3 g/dL — ABNORMAL LOW (ref 3.5–5.0)
Anion gap: 8 (ref 5–15)
BUN: 15 mg/dL (ref 8–23)
CO2: 20 mmol/L — ABNORMAL LOW (ref 22–32)
Calcium: 7.7 mg/dL — ABNORMAL LOW (ref 8.9–10.3)
Chloride: 105 mmol/L (ref 98–111)
Creatinine, Ser: 1.16 mg/dL (ref 0.61–1.24)
GFR calc non Af Amer: 59 mL/min — ABNORMAL LOW (ref >60)
Glucose, Bld: 111 mg/dL — ABNORMAL HIGH (ref 70–99)
Phosphorus: 2.1 mg/dL — ABNORMAL LOW (ref 2.5–4.6)
Potassium: 3.8 mmol/L (ref 3.5–5.1)
Sodium: 133 mmol/L — ABNORMAL LOW (ref 135–145)

## 2020-03-24 LAB — CBC
HCT: 35.9 % — ABNORMAL LOW (ref 39.0–52.0)
Hemoglobin: 11.5 g/dL — ABNORMAL LOW (ref 13.0–17.0)
MCH: 30.3 pg (ref 26.0–34.0)
MCHC: 32 g/dL (ref 30.0–36.0)
MCV: 94.7 fL (ref 80.0–100.0)
Platelets: 307 10*3/uL (ref 150–400)
RBC: 3.79 MIL/uL — ABNORMAL LOW (ref 4.22–5.81)
RDW: 13.4 % (ref 11.5–15.5)
WBC: 8.7 10*3/uL (ref 4.0–10.5)
nRBC: 0 % (ref 0.0–0.2)

## 2020-03-24 LAB — GLUCOSE, CAPILLARY
Glucose-Capillary: 114 mg/dL — ABNORMAL HIGH (ref 70–99)
Glucose-Capillary: 121 mg/dL — ABNORMAL HIGH (ref 70–99)
Glucose-Capillary: 180 mg/dL — ABNORMAL HIGH (ref 70–99)
Glucose-Capillary: 164 mg/dL — ABNORMAL HIGH (ref 70–99)
Glucose-Capillary: 152 mg/dL — ABNORMAL HIGH (ref 70–99)

## 2020-03-24 LAB — MAGNESIUM: Magnesium: 2.2 mg/dL (ref 1.7–2.4)

## 2020-03-24 MED ORDER — K PHOS MONO-SOD PHOS DI & MONO 155-852-130 MG PO TABS
250.0000 mg | ORAL_TABLET | Freq: Three times a day (TID) | ORAL | Status: DC
  Administered 2020-03-24 – 2020-03-25 (×4): 250 mg via ORAL
  Filled 2020-03-24 (×5): qty 1

## 2020-03-24 MED ORDER — SODIUM CHLORIDE 0.9 % IV SOLN: INTRAVENOUS | Status: AC

## 2020-03-24 NOTE — Progress Notes (Signed)
Referring Physician(s): Dr. Benny Lennert  Supervising Physician: Arne Cleveland  Patient Status:  [facility] - In-pt  Chief Complaint: Abdominal pain; s/p subphrenic abscess drain placed 03/21/20 by Dr. Earleen Newport.   Subjective: Patient in bed, awake. He denies pain or discomfort. He states he doesn't have much of an appetite.   Allergies: Metformin and related, Other, and Soliqua [insulin glargine-lixisenatide]  Medications: Prior to Admission medications   Medication Sig Start Date End Date Taking? Authorizing Provider  aspirin EC 81 MG tablet Take 81 mg by mouth daily.   Yes [provider]  atorvastatin (LIPITOR) 20 MG tablet Take 20 mg by mouth every evening.  11/03/13  Yes [provider]  B Complex Vitamins (B COMPLEX PO) Take 1 tablet by mouth daily.   Yes [provider]  brimonidine (ALPHAGAN) 0.2 % ophthalmic solution Place 1 drop into both eyes 2 (two) times daily. 12/28/19  Yes [provider]  cholecalciferol (VITAMIN D-400) 10 MCG (400 UNIT) TABS tablet Take 400 Units by mouth daily.   Yes [provider]  dorzolamide-timolol (COSOPT) 22.3-6.8 MG/ML ophthalmic solution Place 1 drop into both eyes 2 (two) times daily. 03/04/20  Yes [provider]  gabapentin (NEURONTIN) 600 MG tablet Take 600 mg by mouth 2 (two) times daily. 01/20/20  Yes [provider]  insulin detemir (LEVEMIR) 100 UNIT/ML injection Inject 16 Units into the skin 2 (two) times daily.    Yes [provider]  latanoprost (XALATAN) 0.005 % ophthalmic solution Place 1 drop into both eyes at bedtime.  11/17/14  Yes [provider]  levothyroxine (SYNTHROID) 88 MCG tablet Take 88 mcg by mouth at bedtime.  04/21/11  Yes [provider]  metoprolol tartrate (LOPRESSOR) 25 MG tablet Take 25 mg by mouth 2 (two) times daily.   Yes [provider]  olmesartan (BENICAR) 20 MG tablet Take 20 mg by mouth daily. 02/20/20  Yes [provider]  pioglitazone (ACTOS) 30 MG tablet Take 15 mg by mouth daily. 03/04/20  Yes [provider]  RHOPRESSA 0.02 % SOLN Place 1 drop into both eyes at bedtime. 03/05/20  Yes [provider]  Tamsulosin HCl (FLOMAX) 0.4 MG CAPS Take 0.4 mg by mouth 2 (two) times daily.    Yes [provider]  glucose blood test strip  04/04/15   [provider]  Multiple Vitamin (MULTIVITAMIN WITH MINERALS) TABS tablet Take 1 tablet by mouth daily. Patient not taking: Reported on 03/23/2019 09/06/13   [name], MD  ondansetron (ZOFRAN ODT) 4 MG disintegrating tablet 4mg  ODT q4 hours prn nausea/vomit Patient not taking: Reported on 11/12/2019 08/24/19   Drenda Freeze, MD  polyethylene glycol [facility] / Floria Raveling) packet Take 17 g by mouth daily. Patient not taking: Reported on 11/12/2019 09/06/13   [name], MD     Vital Signs: BP (!) 97/59 (BP Location: Left Arm)   Pulse 61   Temp 99 F (37.2 C) (Oral)   Resp 20   Ht 5\' 5"  (1.651 m)   Wt 177 lb (80.3 kg)   SpO2 98%   BMI 29.45 kg/m   Physical Exam Constitutional:      General: He is not in acute distress. Pulmonary:     Effort: Pulmonary effort is normal.  Abdominal:     Palpations: Abdomen is soft.     Tenderness: There is no abdominal tenderness.     Comments: RUQ drain to suction. Approximately 15 cc of pink/red fluid  in bulb. Drain easily flushed. Dressing is clean and dry.   Skin:    General: Skin is warm and dry.  Neurological:     Mental Status: He is alert and oriented to person, place, and time.    Imaging: No results found.  Labs:  CBC: Recent Labs    03/19/20 0644 03/19/20 0644 03/20/20 0536 03/22/20 0654 03/23/20 0409 03/24/20 0335  WBC 9.8  --  10.1 10.3  --  8.7  HGB 13.1   < > 13.0 11.6* 10.6* 11.5*  HCT 38.8*   < > 40.3 36.2* 32.9* 35.9*  PLT 417*  --  383 347  --  307   < > = values in this interval not displayed.    COAGS: Recent Labs     03/19/20 1654  INR 1.3*    BMP: Recent Labs    03/19/20 0641 03/19/20 0641 03/20/20 0536 03/22/20 0654 03/23/20 0409 03/24/20 0335  NA 138   < > 134* 135 137 133*  K 4.4   < > 4.3 3.4* 3.9 3.8  CL 103   < > 101 107 107 105  CO2 24   < > 22 21* 23 20*  GLUCOSE 111*   < > 107* 121* 141* 111*  BUN 12   < > 12 21 19 15   CALCIUM 8.6*   < > 8.6* 7.8* 8.0* 7.7*  CREATININE 1.35*   < > 1.24 1.41* 1.45* 1.16  GFRNONAA 49*   < > 54* 46* 45* 59*  GFRAA 57*  --  >60 54* 52*  --    < > = values in this interval not displayed.    LIVER FUNCTION TESTS: Recent Labs    02/05/20 0421 02/05/20 0421 03/19/20 8242 03/19/20 3536 03/20/20 0536 03/22/20 0654 03/23/20 0409 03/24/20 0335  BILITOT 0.6  --  0.8  --  1.1 0.5  --   --   AST 19  --  13*  --  15 15  --   --   ALT 15  --  13  --  14 12  --   --   ALKPHOS 112  --  88  --  86 68  --   --   PROT 7.7  --  7.6  --  7.4 6.3*  --   --   ALBUMIN 3.4*   < > 2.7*   < > 2.6* 2.3* 2.2* 2.3*   < > = values in this interval not displayed.    Assessment and Plan:  Subphrenic abscess, s/p subphrenic abscess drain placement: 30 cc output documented in Epic, approximately 15 cc in JP bulb. Patient is afebrile, WBC count is normal. Cultures positive for morganella morganii. Patient is being followed by ID.   Continue flushing drain and documenting output each shift. Change dressing daily or as needed.   Other plans per primary teams. IR will continue to follow.   Electronically Signed: Soyla Dryer, AGACNP-BC [phone] 03/24/2020, 4:36 PM   I spent a total of 15 Minutes at the the patient's bedside AND on the patient's hospital floor or unit, greater than 50% of which was counseling/coordinating care for subphrenic abscess drain placement.

## 2020-03-24 NOTE — Progress Notes (Signed)
Physical Therapy Treatment Patient Details Name: Mike Mack MRN: [id number] DOB: 1935-09-10 Today's Date: 03/24/2020    History of Present Illness 84 yo male admitted with R upper quadrant pain and decreased PO intake. CT (+) 13 cm subphrenic abscess contiguity with R lobe of liver and hepatic flexure of the colon. 9/02 IR drained abscess PMH remote hx of subhepatic abscess s/p laparoscopic cholecystecomy, dementia, arthritis R leg, kidney stones, stomach ulcers, polio, HTN, SBO, CVA residual L hand numbness, DM2    PT Comments    Pt reports not feeling well today and requiring at least max assist for bed mobility and mod assist for transferring to recliner.  Pt would benefit from SNF upon d/c.    Follow Up Recommendations  SNF     Equipment Recommendations  None recommended by PT    Recommendations for Other Services       Precautions / Restrictions Precautions Precautions: Fall Precaution Comments: JP drain R flank    Mobility  Bed Mobility Overal bed mobility: Needs Assistance Bed Mobility: Supine to Sit     Supine to sit: Max assist;+2 for physical assistance     General bed mobility comments: requiring assist for LEs over EOB, pt assisted a little with trunk upright  Transfers Overall transfer level: Needs assistance Equipment used: Rolling walker (2 wheeled) Transfers: Sit to/from [facility] Sit to Stand: Mod assist Stand pivot transfers: Mod assist       General transfer comment: pt assisted with rise and steady, pt requiring more assist for a few steps over to recliner, cues for bringing along R LE, pt cued to reach back for armrests however did not and required assist for controlling descent  Ambulation/Gait                 Stairs             Wheelchair Mobility    Modified Rankin (Stroke Patients Only)       Balance           Standing balance support: Bilateral upper extremity supported;During functional  activity Standing balance-Leahy Scale: Poor                              Cognition Arousal/Alertness: Awake/alert Behavior During Therapy: Flat affect Overall Cognitive Status: History of cognitive impairments - at baseline                                 General Comments: pt appears quiet and less happy today; pt just reports overall not feeling well      Exercises      General Comments        Pertinent Vitals/Pain Pain Assessment: No/denies pain Pain Intervention(s): Repositioned;Monitored during session    Home Living                      Prior Function            PT Goals (current goals can now be found in the care plan section) Progress towards PT goals: Progressing toward goals    Frequency    Min 2X/week      PT Plan Current plan remains appropriate    Co-evaluation              AM-PAC PT "6 Clicks" Mobility   Outcome Measure  Help needed turning from  your back to your side while in a flat bed without using bedrails?: A Lot Help needed moving from lying on your back to sitting on the side of a flat bed without using bedrails?: A Lot Help needed moving to and from a bed to a chair (including a wheelchair)?: A Lot Help needed standing up from a chair using your arms (e.g., wheelchair or bedside chair)?: A Lot Help needed to walk in hospital room?: Total Help needed climbing 3-5 steps with a railing? : Total 6 Click Score: 10    End of Session Equipment Utilized During Treatment: Gait belt Activity Tolerance: Patient limited by fatigue Patient left: with chair alarm set;in chair;with call bell/phone within reach Nurse Communication: Mobility status PT Visit Diagnosis: Unsteadiness on feet (R26.81);Difficulty in walking, not elsewhere classified (R26.2)     Time: 7893-8101 PT Time Calculation (min) (ACUTE ONLY): 15 min  Charges:  $Therapeutic Activity: 8-22 mins                     Jannette Spanner PT, DPT Acute  Rehabilitation Services Pager: [phone] Office: [phone]   York Ram E 03/24/2020, 12:30 PM

## 2020-03-24 NOTE — Progress Notes (Signed)
PROGRESS NOTE    Mike Mack  OHY:[id number] DOB: 29-Nov-1935 DOA: 03/19/2020 PCP: Jani Gravel, MD   Chief Complaint  Patient presents with  . Altered Mental Status  Brief Narrative: As per previous attending:  84 year old male with history of DM-2, calculus cholecystitis status post lap chole in 02/4853, complicated by gallbladder fossa abscess requiring IR drainage and IV antibiotic in 08/2013, CKD-3A, CVA, cognitive decline, HTN, post polio syndrome and neuropathy presenting with generalized weakness, poor p.o. intake and right upper quadrant pain for about 2 weeks.  In ED, hypertensive hypertensive to 187/86.  Afebrile.  WBC 9.8. Cr 1.35.  Lipase 20.  Troponin 6. UA with large Hgb, 5 ketones, rare bacteria.  CXR with small bilateral pleural effusions and atelectasis.  CT head without acute finding.  CT abdomen and pelvis with right anterior subphrenic collection measuring up to 13 x 3 cm concerning for abscess. Per radiology, concern about colonic source given proximity.  Cultures obtained.  Patient was started on IV Rocephin and Flagyl.  General surgery, GI and IR consulted.   S/p  IR drain placed:03/20/2020.  Surgical culture Morganella Morgagni sensitive to ceftazidime, Bactrim Zosyn, and imipenem.  Infectious disease and IR were consulted on board.     Subjective: Seen this morning, Somewhat confused unclear baseline. Tmax 98.9. Drain output 30-ml WBC count 8.7 hemoglobin stable, phosphorus low 2.1  Assessment & Plan:  Subphrenic abscess 2/2 Morganella: S/p drain placement by IR-advising flushing 3 times daily, PAR-Q regarding patient and dressing changes as needed and additional imaging when output < 10 ml24 hr not including flush.  Seen by ID-antibiotics changed to Bactrim DS 1 tab twice daily x14 days  Acute metabolic encephalopathy in the background of dementia, and infection.  Appears somewhat confused. Cont on reorientation and supportive care PT OT, minimize sedatives and  opiates.  CKD stage IIIa: Creatinine is stable today. Recent Labs  Lab 03/19/20 0641 03/20/20 0536 03/22/20 0654 03/23/20 0409 03/24/20 0335  BUN 12 12 21 19 15   CREATININE 1.35* 1.24 1.41* 1.45* 1.16   Essential hypertension: On increased dose of metoprolol 2/2 uncontrolled hypertension on admission.  Cont home irbesartan,aspirin and Lipitor.  Add gentle ivf.  Hypothyroidism TSH normal on home Synthroid  Hypophosphatemia keep; add K-Phos p.o.  Mild hyponatremia/mild metabolic acidosis-increase hydration.   BPH no urinary symptoms home Flomax  Generalized weakness/Debility/Failure to thrive: In the setting of acute illness with abscess.  PT OT working with him and plan is for skilled nursing facility  T2DM with stable proliferative retinopathy of both eyes, with long-term current use of insulin, uncontrolled HbA1c.  CBG stable on sliding scale Recent Labs  Lab 03/23/20 1550 03/23/20 2005 03/23/20 2341 03/24/20 0340 03/24/20 0744  GLUCAP 228* 160* 135* 114* 121*   Lab Results  Component Value Date   HGBA1C 8.4 (H) 03/19/2020    DVT prophylaxis: SCDs Start: 03/19/20 1242 Code Status:   Code Status: Full Code Family Communication: plan of care discussed with patient at bedside.  Status is: Inpatient Remains inpatient appropriate because:Inpatient level of care appropriate due to severity of illness and For management of subphrenic abscess  Dispo: The patient is from: Home              Anticipated d/c is to: SNF              Anticipated d/c date is: 1-2 days              Patient currently is not medically stable to d/c.  Nutrition: Diet Order            Diet heart healthy/carb modified Room service appropriate? No; Fluid consistency: Thin  Diet effective now                 Nutrition Problem: Increased nutrient needs Etiology:  (subphrenic abscess) Signs/Symptoms: estimated needs Interventions: Glucerna shake, Magic cup Body mass index is 29.45  kg/m.  Consultants:see note  Procedures:see note Microbiology:see note Blood Culture    Component Value Date/Time   SDES ABSCESS 03/20/2020 1609   SPECREQUEST ABDOMEN 03/20/2020 1609   CULT  03/20/2020 1609    FEW MORGANELLA MORGANII NO ANAEROBES ISOLATED; CULTURE IN PROGRESS FOR 5 DAYS    REPTSTATUS PENDING 03/20/2020 1609    Other culture-see note  Medications: Scheduled Meds: . aspirin EC  81 mg Oral Daily  . atorvastatin  20 mg Oral QPM  . brimonidine  1 drop Both Eyes BID  . Chlorhexidine Gluconate Cloth  6 each Topical Daily  . dorzolamide-timolol  1 drop Both Eyes BID  . feeding supplement (GLUCERNA SHAKE)  237 mL Oral TID BM  . gabapentin  100 mg Oral BID  . insulin aspart  0-15 Units Subcutaneous Q4H  . irbesartan  150 mg Oral Daily  . latanoprost  1 drop Both Eyes QHS  . levothyroxine  88 mcg Oral Q0600  . metoprolol tartrate  50 mg Oral BID  . multivitamin with minerals  1 tablet Oral Daily  . Netarsudil Dimesylate  1 drop Both Eyes QHS  . sodium chloride flush  5 mL Intracatheter Q8H  . sulfamethoxazole-trimethoprim  1 tablet Oral Q12H  . tamsulosin  0.4 mg Oral BID   Continuous Infusions: . sodium chloride Stopped (03/22/20 0008)  . metronidazole 500 mg (03/24/20 0506)  . [COMPLETED] sodium phosphate  Dextrose 5% IVPB 30 mmol (03/23/20 1003)    Antimicrobials: Anti-infectives (From admission, onward)   Start     Dose/Rate Route Frequency Ordered Stop   03/23/20 1530  sulfamethoxazole-trimethoprim (BACTRIM DS) 800-160 MG per tablet 1 tablet        1 tablet Oral Every 12 hours 03/23/20 1519 04/06/20 0959   03/19/20 2000  metroNIDAZOLE (FLAGYL) IVPB 500 mg        500 mg 100 mL/hr over 60 Minutes Intravenous Every 8 hours 03/19/20 1812     03/19/20 2000  cefTRIAXone (ROCEPHIN) 2 g in sodium chloride 0.9 % 100 mL IVPB  Status:  Discontinued        2 g 200 mL/hr over 30 Minutes Intravenous Daily-1800 03/19/20 1830 03/23/20 1519   03/19/20 1815   Ampicillin-Sulbactam (UNASYN) 3 g in sodium chloride 0.9 % 100 mL IVPB  Status:  Discontinued        3 g 200 mL/hr over 30 Minutes Intravenous Every 6 hours 03/19/20 1812 03/19/20 1830     Objective: Vitals: Today's Vitals   03/23/20 2100 03/23/20 2315 03/23/20 2317 03/24/20 0518  BP: 132/77 (!) 177/80 (!) 160/80 (!) 179/79  Pulse:  100 69 95  Resp: 18 18 18 16   Temp: 98.2 F (36.8 C) 98.7 F (37.1 C)  98.6 F (37 C)  TempSrc: Oral Oral    SpO2: 98% 94% 95% 95%  Weight:      Height:      PainSc:        Intake/Output Summary (Last 24 hours) at 03/24/2020 0748 Last data filed at 03/24/2020 0518 Gross per 24 hour  Intake 790.01 ml  Output 680 ml  Net 110.01 ml   Filed Weights   03/19/20 0638 03/19/20 1002  Weight: 80.3 kg 80.3 kg   Weight change:   Intake/Output from previous day: 10/05 0701 - 10/06 0700 In: 790 [P.O.:240; I.V.:80; IV Piggyback:460] Out: 680 [Urine:650; Drains:30] Intake/Output this shift: No intake/output data recorded.  Examination: General exam: AAOx2 ,NAD, weak appearing. HEENT:Oral mucosa moist, Ear/Nose WNL grossly,dentition normal. Respiratory system: bilaterally clear,no wheezing or crackles,no use of accessory muscle, non tender. Cardiovascular system: S1 & S2 +, regular, No JVD. Gastrointestinal system: Abdomen soft, NT,ND, BS+. Nervous System:Alert, awake, moving extremities and grossly nonfocal Extremities: No edema, distal peripheral pulses palpable.  Skin: No rashes,no icterus. MSK: Normal muscle bulk,tone, power Rt abd rain +  Data Reviewed: I have personally reviewed following labs and imaging studies CBC: Recent Labs  Lab 03/19/20 0644 03/20/20 0536 03/22/20 0654 03/23/20 0409 03/24/20 0335  WBC 9.8 10.1 10.3  --  8.7  NEUTROABS 7.5  --   --   --   --   HGB 13.1 13.0 11.6* 10.6* 11.5*  HCT 38.8* 40.3 36.2* 32.9* 35.9*  MCV 92.4 92.4 94.3  --  94.7  PLT 417* 383 347  --  914   Basic Metabolic Panel: Recent Labs  Lab  03/19/20 0641 03/20/20 0536 03/22/20 0654 03/23/20 0409 03/24/20 0335  NA 138 134* 135 137 133*  K 4.4 4.3 3.4* 3.9 3.8  CL 103 101 107 107 105  CO2 24 22 21* 23 20*  GLUCOSE 111* 107* 121* 141* 111*  BUN 12 12 21 19 15   CREATININE 1.35* 1.24 1.41* 1.45* 1.16  CALCIUM 8.6* 8.6* 7.8* 8.0* 7.7*  MG  --   --  2.0 2.5* 2.2  PHOS  --   --  1.6* 1.4* 2.1*   GFR: Estimated Creatinine Clearance: 48.7 mL/min (by C-G formula based on SCr of 1.16 mg/dL). Liver Function Tests: Recent Labs  Lab 03/19/20 0641 03/20/20 0536 03/22/20 0654 03/23/20 0409 03/24/20 0335  AST 13* 15 15  --   --   ALT 13 14 12   --   --   ALKPHOS 88 86 68  --   --   BILITOT 0.8 1.1 0.5  --   --   PROT 7.6 7.4 6.3*  --   --   ALBUMIN 2.7* 2.6* 2.3* 2.2* 2.3*   Recent Labs  Lab 03/19/20 0715  LIPASE 20   Recent Labs  Lab 03/21/20 1729  AMMONIA 16   Coagulation Profile: Recent Labs  Lab 03/19/20 1654  INR 1.3*   Cardiac Enzymes: No results for input(s): CKTOTAL, CKMB, CKMBINDEX, TROPONINI in the last 168 hours. BNP (last 3 results) No results for input(s): PROBNP in the last 8760 hours. HbA1C: No results for input(s): HGBA1C in the last 72 hours. CBG: Recent Labs  Lab 03/23/20 1550 03/23/20 2005 03/23/20 2341 03/24/20 0340 03/24/20 0744  GLUCAP 228* 160* 135* 114* 121*   Lipid Profile: No results for input(s): CHOL, HDL, LDLCALC, TRIG, CHOLHDL, LDLDIRECT in the last 72 hours. Thyroid Function Tests: Recent Labs    03/21/20 1729  TSH 1.432   Anemia Panel: No results for input(s): VITAMINB12, FOLATE, FERRITIN, TIBC, IRON, RETICCTPCT in the last 72 hours. Sepsis Labs: Recent Labs  Lab 03/19/20 1145  LATICACIDVEN 1.2    Recent Results (from the past 240 hour(s))  Respiratory Panel by RT PCR (Flu A&B, Covid) - Nasopharyngeal Swab     Status: None   Collection Time: 03/19/20  7:15 AM   Specimen:  Nasopharyngeal Swab  Result Value Ref Range Status   SARS Coronavirus 2 by RT PCR  NEGATIVE NEGATIVE Final    Comment: (NOTE) SARS-CoV-2 target nucleic acids are NOT DETECTED.  The SARS-CoV-2 RNA is generally detectable in upper respiratoy specimens during the acute phase of infection. The lowest concentration of SARS-CoV-2 viral copies this assay can detect is 131 copies/mL. A negative result does not preclude SARS-Cov-2 infection and should not be used as the sole basis for treatment or other patient management decisions. A negative result may occur with  improper specimen collection/handling, submission of specimen other than nasopharyngeal swab, presence of viral mutation(s) within the areas targeted by this assay, and inadequate number of viral copies (<131 copies/mL). A negative result must be combined with clinical observations, patient history, and epidemiological information. The expected result is Negative.  Fact Sheet for Patients:  PinkCheek.be  Fact Sheet for Healthcare Providers:  GravelBags.it  This test is no t yet approved or cleared by the [place] FDA and  has been authorized for detection and/or diagnosis of SARS-CoV-2 by FDA under an Emergency Use Authorization (EUA). This EUA will remain  in effect (meaning this test can be used) for the duration of the COVID-19 declaration under Section 564(b)(1) of the Act, 21 U.S.C. section 360bbb-3(b)(1), unless the authorization is terminated or revoked sooner.     Influenza A by PCR NEGATIVE NEGATIVE Final   Influenza B by PCR NEGATIVE NEGATIVE Final    Comment: (NOTE) The Xpert Xpress SARS-CoV-2/FLU/RSV assay is intended as an aid in  the diagnosis of influenza from Nasopharyngeal swab specimens and  should not be used as a sole basis for treatment. Nasal washings and  aspirates are unacceptable for Xpert Xpress SARS-CoV-2/FLU/RSV  testing.  Fact Sheet for Patients: PinkCheek.be  Fact Sheet for  Healthcare Providers: GravelBags.it  This test is not yet approved or cleared by the [place] FDA and  has been authorized for detection and/or diagnosis of SARS-CoV-2 by  FDA under an Emergency Use Authorization (EUA). This EUA will remain  in effect (meaning this test can be used) for the duration of the  Covid-19 declaration under Section 564(b)(1) of the Act, 21  U.S.C. section 360bbb-3(b)(1), unless the authorization is  terminated or revoked. Performed at [facility], [place] [name], [place], [place] [address]   Urine culture     Status: None   Collection Time: 03/19/20  9:33 AM   Specimen: In/Out Cath Urine  Result Value Ref Range Status   Specimen Description   Final    IN/OUT CATH URINE Performed at [facility] [address], [place], [place] [address]    Special Requests   Final    NONE Performed at [facility], [place] [address], [place], [place] [address]    Culture   Final    NO GROWTH Performed at [facility], [place] [address], [place], [place] [address]    Report Status 03/20/2020 FINAL  Final  Culture, blood (routine x 2)     Status: None   Collection Time: 03/19/20 11:30 AM   Specimen: BLOOD LEFT HAND  Result Value Ref Range Status   Specimen Description   Final    BLOOD LEFT HAND Performed at [facility] [address], [place], [place] [address]    Special Requests   Final    BOTTLES DRAWN AEROBIC AND ANAEROBIC Blood Culture adequate volume Performed at [facility]  [address], [place], [place] [address]    Culture   Final    NO GROWTH 5 DAYS Performed at [facility], [place] [address] [place], [place] [address]    Report Status 03/24/2020 FINAL  Final  Culture, blood (routine x 2)     Status: None   Collection Time: 03/19/20 11:45 AM   Specimen: BLOOD RIGHT HAND  Result Value Ref Range Status    Specimen Description   Final    BLOOD RIGHT HAND Performed at [facility] [address], [place], [place] [address]    Special Requests   Final    BOTTLES DRAWN AEROBIC AND ANAEROBIC Blood Culture adequate volume Performed at [facility] [address], [place], [place] [address]    Culture   Final    NO GROWTH 5 DAYS Performed at [facility], [place] [address] [place], [place] [address]    Report Status 03/24/2020 FINAL  Final  MRSA PCR Screening     Status: None   Collection Time: 03/20/20  5:37 AM   Specimen: Nasal Mucosa; Nasopharyngeal  Result Value Ref Range Status   MRSA by PCR NEGATIVE NEGATIVE Final    Comment:        The GeneXpert MRSA Assay (FDA approved for NASAL specimens only), is one component of a comprehensive MRSA colonization surveillance program. It is not intended to diagnose MRSA infection nor to guide or monitor treatment for MRSA infections. Performed at [facility], [place] [address], [place], [place] [address]   Aerobic/Anaerobic Culture (surgical/deep wound)     Status: None (Preliminary result)   Collection Time: 03/20/20  4:09 PM   Specimen: Abscess  Result Value Ref Range Status   Specimen Description ABSCESS  Final   Special Requests ABDOMEN  Final   Gram Stain   Final    ABUNDANT WBC PRESENT, PREDOMINANTLY PMN NO ORGANISMS SEEN    Culture   Final    FEW MORGANELLA MORGANII NO ANAEROBES ISOLATED; CULTURE IN PROGRESS FOR 5 DAYS    Report Status PENDING  Incomplete   Organism ID, Bacteria MORGANELLA MORGANII  Final      Susceptibility   Morganella morganii - MIC*    AMPICILLIN >=32 RESISTANT Resistant     CEFAZOLIN >=64 RESISTANT Resistant     CEFTAZIDIME 8 SENSITIVE Sensitive     CIPROFLOXACIN >=4 RESISTANT Resistant     GENTAMICIN <=1 SENSITIVE Sensitive     IMIPENEM 1 SENSITIVE Sensitive     TRIMETH/SULFA <=20 SENSITIVE Sensitive     AMPICILLIN/SULBACTAM >=32  RESISTANT Resistant     PIP/TAZO Value in next row Sensitive      <=4 SENSITIVEPerformed at [facility], [address] [place], [place] [address]    * FEW MORGANELLA MORGANII     Radiology Studies: No results found.   LOS: 5 days   Antonieta Pert, MD [facility]  03/24/2020, 7:48 AM

## 2020-03-24 NOTE — TOC Progression Note (Signed)
Transition of Care [facility]) - Progression Note    Patient Details  Name: Mike Mack MRN: [id number] Date of Birth: [birth year]  Transition of Care [facility]) CM/SW [place], [place] Phone Number: 03/24/2020, 10:07 AM  Clinical Narrative:    Patient insurance [name] is not managed by [facility]. SNF staff notified. Staff will begin authorization.    Expected Discharge Plan: [place] Barriers to Discharge: No Barriers Identified  Expected Discharge Plan and Services Expected Discharge Plan: [place] In-house Referral: Clinical Social Work   Post Acute Care Choice: [place] Living arrangements for the past 2 months: Single Family Home                                       Social Determinants of Health (SDOH) Interventions    Readmission Risk Interventions No flowsheet data found.

## 2020-03-25 DIAGNOSIS — R531 Weakness: Secondary | ICD-10-CM | POA: Diagnosis not present

## 2020-03-25 DIAGNOSIS — Z4803 Encounter for change or removal of drains: Secondary | ICD-10-CM | POA: Diagnosis not present

## 2020-03-25 DIAGNOSIS — E1122 Type 2 diabetes mellitus with diabetic chronic kidney disease: Secondary | ICD-10-CM | POA: Diagnosis not present

## 2020-03-25 DIAGNOSIS — E119 Type 2 diabetes mellitus without complications: Secondary | ICD-10-CM | POA: Diagnosis not present

## 2020-03-25 DIAGNOSIS — R4181 Age-related cognitive decline: Secondary | ICD-10-CM | POA: Diagnosis not present

## 2020-03-25 DIAGNOSIS — R1011 Right upper quadrant pain: Secondary | ICD-10-CM | POA: Diagnosis not present

## 2020-03-25 DIAGNOSIS — N281 Cyst of kidney, acquired: Secondary | ICD-10-CM | POA: Diagnosis not present

## 2020-03-25 DIAGNOSIS — G629 Polyneuropathy, unspecified: Secondary | ICD-10-CM | POA: Diagnosis not present

## 2020-03-25 DIAGNOSIS — K651 Peritoneal abscess: Secondary | ICD-10-CM | POA: Diagnosis not present

## 2020-03-25 DIAGNOSIS — M6259 Muscle wasting and atrophy, not elsewhere classified, multiple sites: Secondary | ICD-10-CM | POA: Diagnosis not present

## 2020-03-25 DIAGNOSIS — E039 Hypothyroidism, unspecified: Secondary | ICD-10-CM | POA: Diagnosis not present

## 2020-03-25 DIAGNOSIS — M6281 Muscle weakness (generalized): Secondary | ICD-10-CM | POA: Diagnosis not present

## 2020-03-25 DIAGNOSIS — E1100 Type 2 diabetes mellitus with hyperosmolarity without nonketotic hyperglycemic-hyperosmolar coma (NKHHC): Secondary | ICD-10-CM | POA: Diagnosis not present

## 2020-03-25 DIAGNOSIS — N1831 Chronic kidney disease, stage 3a: Secondary | ICD-10-CM | POA: Diagnosis not present

## 2020-03-25 DIAGNOSIS — L02211 Cutaneous abscess of abdominal wall: Secondary | ICD-10-CM | POA: Diagnosis not present

## 2020-03-25 DIAGNOSIS — E871 Hypo-osmolality and hyponatremia: Secondary | ICD-10-CM | POA: Diagnosis not present

## 2020-03-25 DIAGNOSIS — Z7401 Bed confinement status: Secondary | ICD-10-CM | POA: Diagnosis not present

## 2020-03-25 DIAGNOSIS — Z20828 Contact with and (suspected) exposure to other viral communicable diseases: Secondary | ICD-10-CM | POA: Diagnosis not present

## 2020-03-25 DIAGNOSIS — I639 Cerebral infarction, unspecified: Secondary | ICD-10-CM | POA: Diagnosis not present

## 2020-03-25 DIAGNOSIS — E785 Hyperlipidemia, unspecified: Secondary | ICD-10-CM | POA: Diagnosis not present

## 2020-03-25 DIAGNOSIS — I959 Hypotension, unspecified: Secondary | ICD-10-CM | POA: Diagnosis not present

## 2020-03-25 DIAGNOSIS — Z978 Presence of other specified devices: Secondary | ICD-10-CM | POA: Diagnosis not present

## 2020-03-25 DIAGNOSIS — I10 Essential (primary) hypertension: Secondary | ICD-10-CM | POA: Diagnosis not present

## 2020-03-25 DIAGNOSIS — N400 Enlarged prostate without lower urinary tract symptoms: Secondary | ICD-10-CM | POA: Diagnosis not present

## 2020-03-25 DIAGNOSIS — G9340 Encephalopathy, unspecified: Secondary | ICD-10-CM | POA: Diagnosis not present

## 2020-03-25 DIAGNOSIS — M2550 Pain in unspecified joint: Secondary | ICD-10-CM | POA: Diagnosis not present

## 2020-03-25 DIAGNOSIS — F0390 Unspecified dementia without behavioral disturbance: Secondary | ICD-10-CM | POA: Diagnosis not present

## 2020-03-25 DIAGNOSIS — N183 Chronic kidney disease, stage 3 unspecified: Secondary | ICD-10-CM | POA: Diagnosis not present

## 2020-03-25 DIAGNOSIS — G14 Postpolio syndrome: Secondary | ICD-10-CM | POA: Diagnosis not present

## 2020-03-25 DIAGNOSIS — H409 Unspecified glaucoma: Secondary | ICD-10-CM | POA: Diagnosis not present

## 2020-03-25 DIAGNOSIS — B964 Proteus (mirabilis) (morganii) as the cause of diseases classified elsewhere: Secondary | ICD-10-CM | POA: Diagnosis not present

## 2020-03-25 DIAGNOSIS — R5381 Other malaise: Secondary | ICD-10-CM | POA: Diagnosis not present

## 2020-03-25 LAB — BASIC METABOLIC PANEL
Anion gap: 6 (ref 5–15)
BUN: 15 mg/dL (ref 8–23)
CO2: 21 mmol/L — ABNORMAL LOW (ref 22–32)
Calcium: 7.5 mg/dL — ABNORMAL LOW (ref 8.9–10.3)
Chloride: 107 mmol/L (ref 98–111)
Creatinine, Ser: 1.48 mg/dL — ABNORMAL HIGH (ref 0.61–1.24)
GFR calc non Af Amer: 44 mL/min — ABNORMAL LOW (ref >60)
Glucose, Bld: 109 mg/dL — ABNORMAL HIGH (ref 70–99)
Potassium: 4 mmol/L (ref 3.5–5.1)
Sodium: 134 mmol/L — ABNORMAL LOW (ref 135–145)

## 2020-03-25 LAB — GLUCOSE, CAPILLARY
Glucose-Capillary: 97 mg/dL (ref 70–99)
Glucose-Capillary: 131 mg/dL — ABNORMAL HIGH (ref 70–99)
Glucose-Capillary: 122 mg/dL — ABNORMAL HIGH (ref 70–99)

## 2020-03-25 LAB — CBC
HCT: 34.1 % — ABNORMAL LOW (ref 39.0–52.0)
Hemoglobin: 10.8 g/dL — ABNORMAL LOW (ref 13.0–17.0)
MCH: 29.5 pg (ref 26.0–34.0)
MCHC: 31.7 g/dL (ref 30.0–36.0)
MCV: 93.2 fL (ref 80.0–100.0)
Platelets: 315 10*3/uL (ref 150–400)
RBC: 3.66 MIL/uL — ABNORMAL LOW (ref 4.22–5.81)
RDW: 13.4 % (ref 11.5–15.5)
WBC: 9 10*3/uL (ref 4.0–10.5)
nRBC: 0 % (ref 0.0–0.2)

## 2020-03-25 LAB — AEROBIC/ANAEROBIC CULTURE W GRAM STAIN (SURGICAL/DEEP WOUND)

## 2020-03-25 LAB — RESPIRATORY PANEL BY RT PCR (FLU A&B, COVID)
Influenza A by PCR: NEGATIVE
Influenza B by PCR: NEGATIVE
SARS Coronavirus 2 by RT PCR: NEGATIVE

## 2020-03-25 MED ORDER — METOPROLOL TARTRATE 50 MG PO TABS
50.0000 mg | ORAL_TABLET | Freq: Two times a day (BID) | ORAL | Status: DC
Start: 2020-03-25 — End: 2020-12-09

## 2020-03-25 MED ORDER — ALBUTEROL SULFATE HFA 108 (90 BASE) MCG/ACT IN AERS
2.0000 | INHALATION_SPRAY | Freq: Once | RESPIRATORY_TRACT | Status: AC
  Administered 2020-03-25: 2 via RESPIRATORY_TRACT
  Filled 2020-03-25: qty 6.7

## 2020-03-25 MED ORDER — K PHOS MONO-SOD PHOS DI & MONO 155-852-130 MG PO TABS: 250.0000 mg | ORAL_TABLET | Freq: Three times a day (TID) | ORAL | 0 refills | Status: AC

## 2020-03-25 MED ORDER — SULFAMETHOXAZOLE-TRIMETHOPRIM 800-160 MG PO TABS: 1.0000 | ORAL_TABLET | Freq: Two times a day (BID) | ORAL | 0 refills | Status: AC

## 2020-03-25 MED ORDER — GLUCERNA SHAKE PO LIQD: 237.0000 mL | Freq: Three times a day (TID) | ORAL | 0 refills | Status: DC

## 2020-03-25 NOTE — Discharge Summary (Signed)
Physician Discharge Summary  Mike Mack HTD:[id number] DOB: 1935/09/21 DOA: 03/19/2020  PCP: Jani Gravel, MD  Admit date: 03/19/2020 Discharge date: 03/25/2020  Admitted From: home Disposition:  SNF  Recommendations for Outpatient Follow-up:  1. Follow up with PCP in 1-2 weeks 2. Please obtain BMP/CBC in one week 3. Please follow up on the following pending results:  Home Health:NO  Equipment/Devices: rt abd drain  Discharge Condition: Stable Code Status:   Code Status: Full Code Diet recommendation:  Diet Order            Diet - low sodium heart healthy           Diet heart healthy/carb modified Room service appropriate? No; Fluid consistency: Thin  Diet effective now                  Brief/Interim Summary: As per previous attending:  84 year old male with history of DM-2, calculus cholecystitis status post lap chole in 12/2618, complicated by gallbladder fossa abscess requiring IR drainage and IV antibiotic in 08/2013, CKD-3A, CVA, cognitive decline, HTN, post polio syndrome and neuropathy presenting with generalized weakness, poor p.o. intake and right upper quadrant pain for about 2 weeks.  In ED, hypertensive hypertensive to 187/86.  Afebrile.  WBC 9.8. Cr 1.35.  Lipase 20.  Troponin 6. UA with large Hgb, 5 ketones, rare bacteria.  CXR with small bilateral pleural effusions and atelectasis.  CT head without acute finding.  CT abdomen and pelvis with right anterior subphrenic collection measuring up to 13 x 3 cm concerning for abscess. Per radiology, concern about colonic source given proximity.  Cultures obtained.  Patient was started on IV Rocephin and Flagyl.  General surgery, GI and IR consulted.  S/p  IR drain placed:03/20/2020.  Surgical culture Morganella Morgagni sensitive to ceftazidime, Bactrim Zosyn, and imipenem.  Infectious disease and IR were consulted on board.   10/6-drain output 30 mL> 74ml. WBC count 8.7 > 9, hemoglobinat 10.8,   Discharge Diagnoses:   Assessment & Plan:  Subphrenic abscess 2/2 Morganella: S/p drain placement by IR-continue drain flush and as needed dressing changes as per IR. As per IR " When the drain output has been <10 cc for a few days  IR will re-scan him and see if things have cleared up. If so IR will pull the drain".IR will arrange the outpatient follow ups. Seen by ID-antibiotics changed to Bactrim DS 1 tab twice daily x14 days-monitor renal function closely while on Bactrim.  Acute metabolic encephalopathy in the background of dementia, and infection. Aaox3.  Supportive care.  CKD stage IIIa: Creatinine fluctuating ~1.1-1.4.Monitor level closely while on Bactrim, at least weekly labs at [facility] Recent Labs  Lab 03/20/20 0536 03/22/20 0654 03/23/20 0409 03/24/20 0335 03/25/20 0336  BUN 12 21 19 15 15   CREATININE 1.24 1.41* 1.45* 1.16 1.48*   Essential hypertension: BP on higher side was soft 10/6 evening. On increased dose of metoprolol 2/2 uncontrolled hypertension on admission.  Cont home irbesartan,aspirin and Lipitor.   Hypothyroidism TSH normal on home Synthroid  Hypophosphatemia keep; cont K-Phos p.o.  Mild hyponatremia/mild metabolic acidosis: Sodium improved at 134, bicarbonate 28.  Encourage oral hydration.  BPH no urinary symptoms home Flomax  Generalized weakness/Debility/Failure to thrive: In the setting of acute illness with abscess.  PT OT working with him and plan is for skilled nursing facility.    T2DM with stable proliferative retinopathy of both eyes, with long-term current use of insulin, uncontrolled HbA1c.  Blood sugar well controlled continue  current insulin. Recent Labs  Lab 03/24/20 1141 03/24/20 1600 03/24/20 2026 03/25/20 0335 03/25/20 0732  GLUCAP 180* 164* 152* 97 131*   Lab Results  Component Value Date   HGBA1C 8.4 (H) 03/19/2020   Pressure Ulcer: Pressure Ulcer 08/14/13 Deep Tissue Injury - Purple or maroon localized area of discolored intact skin or blood-filled  blister due to damage of underlying soft tissue from pressure and/or shear. 1X3cm; Prevalon boot ordered to reduce pressure (Active)  08/14/13   Location: Heel  Location Orientation: Right  Staging: Deep Tissue Injury - Purple or maroon localized area of discolored intact skin or blood-filled blister due to damage of underlying soft tissue from pressure and/or shear.  Wound Description (Comments): 1X3cm; Prevalon boot ordered to reduce pressure  Present on Admission: Yes    Consults:  IR  ID  Subjective: Alert,awake. Doing well, eating, no abd pain, no fever. Feels ready for SNF today  Discharge Exam: Vitals:   03/24/20 2250 03/25/20 0525  BP: 136/60 (!) 164/78  Pulse: 66 77  Resp: 18 16  Temp: 99 F (37.2 C) 98.7 F (37.1 C)  SpO2: 96% 93%   General: Pt is alert, awake, not in acute distress Cardiovascular: RRR, S1/S2 +, no rubs, no gallops Respiratory: CTA bilaterally, no wheezing, no rhonchi Abdominal: Soft, NT, ND, bowel sounds + Extremities: no edema, no cyanosis Rt abd drain+  Discharge Instructions  Discharge Instructions    Diet - low sodium heart healthy   Complete by: As directed    Discharge instructions   Complete by: As directed    Continue flushing  89ml sterile normal saline  flush TID and documenting output each shift. Change dressing daily or as needed.  Follow up with [facility] for drain care with repeat ct imaging and drain pull out. Call office.  CHECK BMP WEEKLY while on bactrim to monitor renal function.  Please call call MD or return to ER for similar or worsening recurring problem that brought you to hospital or if any fever,nausea/vomiting,abdominal pain, uncontrolled pain, chest pain,  shortness of breath or any other alarming symptoms.  Please follow-up your doctor as instructed in a week time and call the office for appointment.  Please avoid alcohol, smoking, or any other illicit substance and maintain healthy habits including  taking your regular medications as prescribed.  You were cared for by a hospitalist during your hospital stay. If you have any questions about your discharge medications or the care you received while you were in the hospital after you are discharged, you can call the unit and ask to speak with the hospitalist on call if the hospitalist that took care of you is not available.  Once you are discharged, your primary care physician will handle any further medical issues. Please note that NO REFILLS for any discharge medications will be authorized once you are discharged, as it is imperative that you return to your primary care physician (or establish a relationship with a primary care physician if you do not have one) for your aftercare needs so that they can reassess your need for medications and monitor your lab values   Increase activity slowly   Complete by: As directed    No wound care   Complete by: As directed      Allergies as of 03/25/2020      Reactions   Metformin And Related Other (See Comments)   Gi intolerance   Other    Other reaction(s): Other (See Comments) Renal insufficiency  Other reaction(s): GI Upset (intolerance) Gi intolerance   Soliqua [insulin Glargine-lixisenatide] Other (See Comments)   Stomach cramps      Medication List    TAKE these medications   aspirin EC 81 MG tablet Take 81 mg by mouth daily.   atorvastatin 20 MG tablet Commonly known as: LIPITOR Take 20 mg by mouth every evening.   B COMPLEX PO Take 1 tablet by mouth daily.   brimonidine 0.2 % ophthalmic solution Commonly known as: ALPHAGAN Place 1 drop into both eyes 2 (two) times daily.   dorzolamide-timolol 22.3-6.8 MG/ML ophthalmic solution Commonly known as: COSOPT Place 1 drop into both eyes 2 (two) times daily.   feeding supplement (GLUCERNA SHAKE) Liqd Take 237 mLs by mouth 3 (three) times daily between meals.   gabapentin 600 MG tablet Commonly known as: NEURONTIN Take 600 mg  by mouth 2 (two) times daily.   glucose blood test strip   insulin detemir 100 UNIT/ML injection Commonly known as: LEVEMIR Inject 16 Units into the skin 2 (two) times daily.   latanoprost 0.005 % ophthalmic solution Commonly known as: XALATAN Place 1 drop into both eyes at bedtime.   levothyroxine 88 MCG tablet Commonly known as: SYNTHROID Take 88 mcg by mouth at bedtime.   metoprolol tartrate 50 MG tablet Commonly known as: LOPRESSOR Take 1 tablet (50 mg total) by mouth 2 (two) times daily. What changed:   medication strength  how much to take   multivitamin with minerals Tabs tablet Take 1 tablet by mouth daily.   olmesartan 20 MG tablet Commonly known as: BENICAR Take 20 mg by mouth daily.   ondansetron 4 MG disintegrating tablet Commonly known as: Zofran ODT 4mg  ODT q4 hours prn nausea/vomit   phosphorus 155-852-130 MG tablet Commonly known as: K PHOS NEUTRAL Take 1 tablet (250 mg total) by mouth 3 (three) times daily for 2 days.   pioglitazone 30 MG tablet Commonly known as: ACTOS Take 15 mg by mouth daily.   polyethylene glycol 17 g packet Commonly known as: MIRALAX / GLYCOLAX Take 17 g by mouth daily.   Rhopressa 0.02 % Soln Generic drug: Netarsudil Dimesylate Place 1 drop into both eyes at bedtime.   sulfamethoxazole-trimethoprim 800-160 MG tablet Commonly known as: BACTRIM DS Take 1 tablet by mouth 2 (two) times daily for 14 days. Continue through 10/19   tamsulosin 0.4 MG Caps capsule Commonly known as: FLOMAX Take 0.4 mg by mouth 2 (two) times daily.   Vitamin D-400 10 MCG (400 UNIT) Tabs tablet Generic drug: cholecalciferol Take 400 Units by mouth daily.       Follow-up Information    Jani Gravel, MD Follow up.   Specialty: Internal Medicine Contact information: [address] [place] [address] [phone]        [facility] Follow up.   Why: call for [facility]. Contact  information: [place] [address] [phone]              Allergies  Allergen Reactions  . Metformin And Related Other (See Comments)    Gi intolerance  . Other     Other reaction(s): Other (See Comments) Renal insufficiency Other reaction(s): GI Upset (intolerance) Gi intolerance  . [name] [Insulin Glargine-Lixisenatide] Other (See Comments)    Stomach cramps    The results of significant diagnostics from this hospitalization (including imaging, microbiology, ancillary and laboratory) are listed below for reference.    Microbiology: Recent Results (from the  past 240 hour(s))  Respiratory Panel by RT PCR (Flu A&B, Covid) - Nasopharyngeal Swab     Status: None   Collection Time: 03/19/20  7:15 AM   Specimen: Nasopharyngeal Swab  Result Value Ref Range Status   SARS Coronavirus 2 by RT PCR NEGATIVE NEGATIVE Final    Comment: (NOTE) SARS-CoV-2 target nucleic acids are NOT DETECTED.  The SARS-CoV-2 RNA is generally detectable in upper respiratoy specimens during the acute phase of infection. The lowest concentration of SARS-CoV-2 viral copies this assay can detect is 131 copies/mL. A negative result does not preclude SARS-Cov-2 infection and should not be used as the sole basis for treatment or other patient management decisions. A negative result may occur with  improper specimen collection/handling, submission of specimen other than nasopharyngeal swab, presence of viral mutation(s) within the areas targeted by this assay, and inadequate number of viral copies (<131 copies/mL). A negative result must be combined with clinical observations, patient history, and epidemiological information. The expected result is Negative.  Fact Sheet for Patients:  PinkCheek.be  Fact Sheet for Healthcare Providers:  GravelBags.it  This test is no t yet approved or cleared by the [place] FDA and   has been authorized for detection and/or diagnosis of SARS-CoV-2 by FDA under an Emergency Use Authorization (EUA). This EUA will remain  in effect (meaning this test can be used) for the duration of the COVID-19 declaration under Section 564(b)(1) of the Act, 21 U.S.C. section 360bbb-3(b)(1), unless the authorization is terminated or revoked sooner.     Influenza A by PCR NEGATIVE NEGATIVE Final   Influenza B by PCR NEGATIVE NEGATIVE Final    Comment: (NOTE) The Xpert Xpress SARS-CoV-2/FLU/RSV assay is intended as an aid in  the diagnosis of influenza from Nasopharyngeal swab specimens and  should not be used as a sole basis for treatment. Nasal washings and  aspirates are unacceptable for Xpert Xpress SARS-CoV-2/FLU/RSV  testing.  Fact Sheet for Patients: PinkCheek.be  Fact Sheet for Healthcare Providers: GravelBags.it  This test is not yet approved or cleared by the [place] FDA and  has been authorized for detection and/or diagnosis of SARS-CoV-2 by  FDA under an Emergency Use Authorization (EUA). This EUA will remain  in effect (meaning this test can be used) for the duration of the  Covid-19 declaration under Section 564(b)(1) of the Act, 21  U.S.C. section 360bbb-3(b)(1), unless the authorization is  terminated or revoked. Performed at [facility], [place] [address] [place], [place] [address]   Urine culture     Status: None   Collection Time: 03/19/20  9:33 AM   Specimen: In/Out Cath Urine  Result Value Ref Range Status   Specimen Description   Final    IN/OUT CATH URINE Performed at [facility] [address], [place], [place] [address]    Special Requests   Final    NONE Performed at [facility], [place] [address] [place], [place] [address]    Culture   Final    NO GROWTH Performed at [facility], [place] [address] [place],   [address]    Report Status 03/20/2020 FINAL  Final  Culture, blood (routine x 2)     Status: None   Collection Time: 03/19/20 11:30 AM   Specimen: BLOOD LEFT HAND  Result Value Ref Range Status   Specimen Description   Final    BLOOD LEFT HAND Performed at [facility]  [address] [place], [place] [address]    Special Requests   Final    BOTTLES DRAWN AEROBIC AND ANAEROBIC Blood Culture adequate volume Performed at [facility] [address] [place], [place] [address]    Culture   Final    NO GROWTH 5 DAYS Performed at [facility], [place] [address], [place], [place] [address]    Report Status 03/24/2020 FINAL  Final  Culture, blood (routine x 2)     Status: None   Collection Time: 03/19/20 11:45 AM   Specimen: BLOOD RIGHT HAND  Result Value Ref Range Status   Specimen Description   Final    BLOOD RIGHT HAND Performed at [facility] [address] [place], [place] [address]    Special Requests   Final    BOTTLES DRAWN AEROBIC AND ANAEROBIC Blood Culture adequate volume Performed at [facility] [address] [place], [place] [address]    Culture   Final    NO GROWTH 5 DAYS Performed at [facility], [place] [address] [place], [place] [address]    Report Status 03/24/2020 FINAL  Final  MRSA PCR Screening     Status: None   Collection Time: 03/20/20  5:37 AM   Specimen: Nasal Mucosa; Nasopharyngeal  Result Value Ref Range Status   MRSA by PCR NEGATIVE NEGATIVE Final    Comment:        The GeneXpert MRSA Assay (FDA approved for NASAL specimens only), is one component of a comprehensive MRSA colonization surveillance program. It is not intended to diagnose MRSA infection nor to guide or monitor treatment for MRSA infections. Performed at [facility], [place] [address], [place], [place] [address]   Aerobic/Anaerobic Culture (surgical/deep wound)     Status:  None (Preliminary result)   Collection Time: 03/20/20  4:09 PM   Specimen: Abscess  Result Value Ref Range Status   Specimen Description ABSCESS  Final   Special Requests ABDOMEN  Final   Gram Stain   Final    ABUNDANT WBC PRESENT, PREDOMINANTLY PMN NO ORGANISMS SEEN    Culture   Final    FEW MORGANELLA MORGANII NO ANAEROBES ISOLATED; CULTURE IN PROGRESS FOR 5 DAYS    Report Status PENDING  Incomplete   Organism ID, Bacteria MORGANELLA MORGANII  Final      Susceptibility   Morganella morganii - MIC*    AMPICILLIN >=32 RESISTANT Resistant     CEFAZOLIN >=64 RESISTANT Resistant     CEFTAZIDIME 8 SENSITIVE Sensitive     CIPROFLOXACIN >=4 RESISTANT Resistant     GENTAMICIN <=1 SENSITIVE Sensitive     IMIPENEM 1 SENSITIVE Sensitive     TRIMETH/SULFA <=20 SENSITIVE Sensitive     AMPICILLIN/SULBACTAM >=32 RESISTANT Resistant     PIP/TAZO Value in next row Sensitive      <=4 SENSITIVEPerformed at [facility], [address], [place], [place] [address]    * FEW MORGANELLA MORGANII    Procedures/Studies: CT Head Wo Contrast  Result Date: 03/19/2020 CLINICAL DATA:  Mental status change EXAM: CT HEAD WITHOUT CONTRAST TECHNIQUE: Contiguous axial images were obtained from the base of the skull through the vertex without intravenous contrast. COMPARISON:  10/25/2014 head CT and prior. FINDINGS: Brain: No acute infarct or intracranial hemorrhage. No mass lesion. No midline shift or extra-axial fluid collection. Panventricular dilatation is unchanged. Scattered and confluent hypodense foci involving the periventricular and subcortical white matter nonspecific however commonly associated with  chronic microvascular ischemic changes. Sequela of more remote bilateral cerebellar insults. Vascular: No hyperdense vessel or unexpected calcification. Bilateral skull base atherosclerotic calcifications. Skull: Negative for fracture or focal lesion. Sinuses/Orbits: Sequela of bilateral lens replacement.  Clear paranasal sinuses. No mastoid effusion. Other: None. IMPRESSION: No acute intracranial process. Remote bilateral cerebellar infarcts. Panventricular dilatation, unchanged. Consider normal pressure hydrocephalus. Chronic microvascular ischemic changes are unchanged. Electronically Signed   By: Primitivo Gauze M.D.   On: 03/19/2020 09:25   CT Abdomen Pelvis W Contrast  Result Date: 03/19/2020 CLINICAL DATA:  Acute nonlocalized abdominal pain EXAM: CT ABDOMEN AND PELVIS WITH CONTRAST TECHNIQUE: Multidetector CT imaging of the abdomen and pelvis was performed using the standard protocol following bolus administration of intravenous contrast. CONTRAST:  64mL OMNIPAQUE IOHEXOL 300 MG/ML  SOLN COMPARISON:  08/24/2019 FINDINGS: Lower chest: Small right more than left pleural effusion and lower lobe atelectasis. Coronary atherosclerosis. Hepatobiliary: No focal liver abnormality.Cholecystectomy. No bile duct dilatation. Pancreas: Unremarkable. Spleen: Unremarkable. Adrenals/Urinary Tract: Negative adrenals. No hydronephrosis or ureteral stone. Punctate right lower pole calculi. Bilateral renal cystic densities which are unchanged. Bladder wall thickening which is circumferential and likely from chronic outlet obstruction. Near calcific density along the mucosal surface anteriorly and inferiorly, also seen previously. Stomach/Bowel: No obstruction. No detected bowel wall thickening, no appendicitis. A small track like soft tissue density structure extends superiorly from the splenic flexure towards the subphrenic collection. Vascular/Lymphatic: No acute vascular abnormality. No mass or adenopathy. Reproductive:Symmetric enlargement of the prostate. Other: Rim enhancing fluid collection in the anterior subphrenic space, contiguous with the right liver. Collection measures 13 cm in length by 3 cm in thickness. Postoperative right groin, possibly for hernia repair. Fatty umbilical hernia. Musculoskeletal: No acute  abnormalities. Asymmetric muscular atrophy involving the right hip. IMPRESSION: 1. Right anterior subphrenic collection measuring up to 13 x 3 cm-most likely an abscess. No clear underlying cause but a colonic source is favored given close positioning of the splenic flexure with small track like structure projecting superiorly towards the collection. No active colon inflammation is seen. 2. Small right more than left pleural effusion, likely sympathetic. 3. Chronic findings are described above. Electronically Signed   By: Monte Fantasia M.D.   On: 03/19/2020 11:14   DG Chest Port 1 View  Result Date: 03/19/2020 CLINICAL DATA:  Cough EXAM: PORTABLE CHEST 1 VIEW COMPARISON:  Oct 25, 2014. FINDINGS: There are pleural effusions, larger on the right than on the left, with bibasilar atelectasis. The lungs elsewhere are clear. The heart size and pulmonary vascularity are normal. No adenopathy. Note that there is an azygos lobe on the right, an anatomic variant. No bone lesions. IMPRESSION: Small pleural effusions bilaterally, larger on the right than on the left, with bibasilar atelectasis. Lungs elsewhere clear. Heart size normal. Electronically Signed   By: Lowella Grip III M.D.   On: 03/19/2020 07:54   CT IMAGE GUIDED DRAINAGE BY PERCUTANEOUS CATHETER  Result Date: 03/21/2020 INDICATION: 84 year old male with a history subphrenic abscess EXAM: CT GUIDED DRAINAGE OF  ABSCESS MEDICATIONS: The patient is currently admitted to the hospital and receiving intravenous antibiotics. The antibiotics were administered within an appropriate time frame prior to the initiation of the procedure. ANESTHESIA/SEDATION: 0.25 mg IV Versed 50 mcg IV Fentanyl Moderate Sedation Time:  10 minutes The patient was continuously monitored during the procedure by the interventional radiology nurse under my direct supervision. COMPLICATIONS: None TECHNIQUE: Informed written consent was obtained from the patient after a thorough  discussion of the procedural  risks, benefits and alternatives. All questions were addressed. Maximal Sterile Barrier Technique was utilized including caps, mask, sterile gowns, sterile gloves, sterile drape, hand hygiene and skin antiseptic. A timeout was performed prior to the initiation of the procedure. PROCEDURE: The operative field was prepped with Chlorhexidine in a sterile fashion, and a sterile drape was applied covering the operative field. A sterile gown and sterile gloves were used for the procedure. Local anesthesia was provided with 1% Lidocaine. Patient was positioned supine position on CT gantry table. Scout CT was acquired for planning purposes. The patient was then prepped and draped in the usual sterile fashion. 1% lidocaine was used for local anesthesia. Using CT guidance, trocar needle was advanced into the right upper quadrant abscess. Once we aspirated purulent material, modified Seldinger technique was used to place a 10 [place] drain. Proximally 50 cc of purulent material aspirated through the pigtail catheter. Ten [place] drain was sutured in place and attached to bulb suction. Final CT image was acquired. Patient tolerated the procedure well and remained hemodynamically stable throughout. No complications were encountered and no significant blood loss. FINDINGS: Right subphrenic abscess, similar to the comparison. Pigtail drainage catheter into the collection with approximately 50 cc of purulent material aspirated. IMPRESSION: Status post CT-guided drainage of right upper quadrant abscess. Signed, Dulcy Fanny. Dellia Nims, RPVI [facility] Electronically Signed   By: Corrie Mckusick D.O.   On: 03/21/2020 04:55    Labs: BNP (last 3 results) No results for input(s): BNP in the last 8760 hours. Basic Metabolic Panel: Recent Labs  Lab 03/20/20 0536 03/22/20 0654 03/23/20 0409 03/24/20 0335 03/25/20 0336  NA 134* 135 137 133* 134*   K 4.3 3.4* 3.9 3.8 4.0  CL 101 107 107 105 107  CO2 22 21* 23 20* 21*  GLUCOSE 107* 121* 141* 111* 109*  BUN 12 21 19 15 15   CREATININE 1.24 1.41* 1.45* 1.16 1.48*  CALCIUM 8.6* 7.8* 8.0* 7.7* 7.5*  MG  --  2.0 2.5* 2.2  --   PHOS  --  1.6* 1.4* 2.1*  --    Liver Function Tests: Recent Labs  Lab 03/19/20 0641 03/20/20 0536 03/22/20 0654 03/23/20 0409 03/24/20 0335  AST 13* 15 15  --   --   ALT 13 14 12   --   --   ALKPHOS 88 86 68  --   --   BILITOT 0.8 1.1 0.5  --   --   PROT 7.6 7.4 6.3*  --   --   ALBUMIN 2.7* 2.6* 2.3* 2.2* 2.3*   Recent Labs  Lab 03/19/20 0715  LIPASE 20   Recent Labs  Lab 03/21/20 1729  AMMONIA 16   CBC: Recent Labs  Lab 03/19/20 0644 03/19/20 0644 03/20/20 0536 03/22/20 0654 03/23/20 0409 03/24/20 0335 03/25/20 0336  WBC 9.8  --  10.1 10.3  --  8.7 9.0  NEUTROABS 7.5  --   --   --   --   --   --   HGB 13.1   < > 13.0 11.6* 10.6* 11.5* 10.8*  HCT 38.8*   < > 40.3 36.2* 32.9* 35.9* 34.1*  MCV 92.4  --  92.4 94.3  --  94.7 93.2  PLT 417*  --  383 347  --  307 315   < > = values in this interval not displayed.   Cardiac Enzymes: No results for input(s): CKTOTAL, CKMB, CKMBINDEX, TROPONINI in the last 168 hours. BNP:  Invalid input(s): POCBNP CBG: Recent Labs  Lab 03/24/20 1141 03/24/20 1600 03/24/20 2026 03/25/20 0335 03/25/20 0732  GLUCAP 180* 164* 152* 97 131*   D-Dimer No results for input(s): DDIMER in the last 72 hours. Hgb A1c No results for input(s): HGBA1C in the last 72 hours. Lipid Profile No results for input(s): CHOL, HDL, LDLCALC, TRIG, CHOLHDL, LDLDIRECT in the last 72 hours. Thyroid function studies No results for input(s): TSH, T4TOTAL, T3FREE, THYROIDAB in the last 72 hours.  Invalid input(s): FREET3 Anemia work up No results for input(s): VITAMINB12, FOLATE, FERRITIN, TIBC, IRON, RETICCTPCT in the last 72 hours. Urinalysis    Component Value Date/Time   COLORURINE YELLOW 03/19/2020 0933    APPEARANCEUR CLEAR 03/19/2020 0933   LABSPEC 1.013 03/19/2020 0933   PHURINE 7.0 03/19/2020 0933   GLUCOSEU NEGATIVE 03/19/2020 0933   HGBUR LARGE (A) 03/19/2020 0933   BILIRUBINUR NEGATIVE 03/19/2020 0933   KETONESUR 5 (A) 03/19/2020 0933   PROTEINUR 100 (A) 03/19/2020 0933   UROBILINOGEN 0.2 10/25/2014 1520   NITRITE NEGATIVE 03/19/2020 0933   LEUKOCYTESUR SMALL (A) 03/19/2020 0933   Sepsis Labs Invalid input(s): PROCALCITONIN,  WBC,  LACTICIDVEN Microbiology Recent Results (from the past 240 hour(s))  Respiratory Panel by RT PCR (Flu A&B, Covid) - Nasopharyngeal Swab     Status: None   Collection Time: 03/19/20  7:15 AM   Specimen: Nasopharyngeal Swab  Result Value Ref Range Status   SARS Coronavirus 2 by RT PCR NEGATIVE NEGATIVE Final    Comment: (NOTE) SARS-CoV-2 target nucleic acids are NOT DETECTED.  The SARS-CoV-2 RNA is generally detectable in upper respiratoy specimens during the acute phase of infection. The lowest concentration of SARS-CoV-2 viral copies this assay can detect is 131 copies/mL. A negative result does not preclude SARS-Cov-2 infection and should not be used as the sole basis for treatment or other patient management decisions. A negative result may occur with  improper specimen collection/handling, submission of specimen other than nasopharyngeal swab, presence of viral mutation(s) within the areas targeted by this assay, and inadequate number of viral copies (<131 copies/mL). A negative result must be combined with clinical observations, patient history, and epidemiological information. The expected result is Negative.  Fact Sheet for Patients:  PinkCheek.be  Fact Sheet for Healthcare Providers:  GravelBags.it  This test is no t yet approved or cleared by the [place] FDA and  has been authorized for detection and/or diagnosis of SARS-CoV-2 by FDA under an Emergency Use Authorization  (EUA). This EUA will remain  in effect (meaning this test can be used) for the duration of the COVID-19 declaration under Section 564(b)(1) of the Act, 21 U.S.C. section 360bbb-3(b)(1), unless the authorization is terminated or revoked sooner.     Influenza A by PCR NEGATIVE NEGATIVE Final   Influenza B by PCR NEGATIVE NEGATIVE Final    Comment: (NOTE) The Xpert Xpress SARS-CoV-2/FLU/RSV assay is intended as an aid in  the diagnosis of influenza from Nasopharyngeal swab specimens and  should not be used as a sole basis for treatment. Nasal washings and  aspirates are unacceptable for Xpert Xpress SARS-CoV-2/FLU/RSV  testing.  Fact Sheet for Patients: PinkCheek.be  Fact Sheet for Healthcare Providers: GravelBags.it  This test is not yet approved or cleared by the [place] FDA and  has been authorized for detection and/or diagnosis of SARS-CoV-2 by  FDA under an Emergency Use Authorization (EUA). This EUA will remain  in effect (meaning this test can be used) for the duration of  the  Covid-19 declaration under Section 564(b)(1) of the Act, 21  U.S.C. section 360bbb-3(b)(1), unless the authorization is  terminated or revoked. Performed at [facility], [place] [address] [place], [place] [address]   Urine culture     Status: None   Collection Time: 03/19/20  9:33 AM   Specimen: In/Out Cath Urine  Result Value Ref Range Status   Specimen Description   Final    IN/OUT CATH URINE Performed at [facility] [address] [place], [place] [address]    Special Requests   Final    NONE Performed at [facility], [place] [address], [place], [place] [address]    Culture   Final    NO GROWTH Performed at [facility], [place] [address] [place], [place] [address]    Report Status 03/20/2020 FINAL  Final  Culture, blood (routine x 2)     Status: None   Collection  Time: 03/19/20 11:30 AM   Specimen: BLOOD LEFT HAND  Result Value Ref Range Status   Specimen Description   Final    BLOOD LEFT HAND Performed at [facility] [address], [place], [place] [address]    Special Requests   Final    BOTTLES DRAWN AEROBIC AND ANAEROBIC Blood Culture adequate volume Performed at [facility] [address], [place], [place] [address]    Culture   Final    NO GROWTH 5 DAYS Performed at [facility], [place] [address], [place], [place] [address]    Report Status 03/24/2020 FINAL  Final  Culture, blood (routine x 2)     Status: None   Collection Time: 03/19/20 11:45 AM   Specimen: BLOOD RIGHT HAND  Result Value Ref Range Status   Specimen Description   Final    BLOOD RIGHT HAND Performed at [facility] [address], [place], [place] [address]    Special Requests   Final    BOTTLES DRAWN AEROBIC AND ANAEROBIC Blood Culture adequate volume Performed at [facility] [address] [place], [place] [address]    Culture   Final    NO GROWTH 5 DAYS Performed at [facility], [place] [address], [place], [place] [address]    Report Status 03/24/2020 FINAL  Final  MRSA PCR Screening     Status: None   Collection Time: 03/20/20  5:37 AM   Specimen: Nasal Mucosa; Nasopharyngeal  Result Value Ref Range Status   MRSA by PCR NEGATIVE NEGATIVE Final    Comment:        The GeneXpert MRSA Assay (FDA approved for NASAL specimens only), is one component of a comprehensive MRSA colonization surveillance program. It is not intended to diagnose MRSA infection nor to guide or monitor treatment for MRSA infections. Performed at [facility], [place] [address], [place], [place] [address]   Aerobic/Anaerobic Culture (surgical/deep wound)     Status: None (Preliminary result)   Collection Time: 03/20/20  4:09 PM   Specimen: Abscess  Result Value Ref Range  Status   Specimen Description ABSCESS  Final   Special Requests ABDOMEN  Final   Gram Stain   Final    ABUNDANT WBC PRESENT, PREDOMINANTLY PMN NO ORGANISMS SEEN    Culture   Final    FEW MORGANELLA MORGANII NO ANAEROBES ISOLATED; CULTURE IN PROGRESS FOR 5 DAYS    Report Status PENDING  Incomplete  Organism ID, Bacteria MORGANELLA MORGANII  Final      Susceptibility   Morganella morganii - MIC*    AMPICILLIN >=32 RESISTANT Resistant     CEFAZOLIN >=64 RESISTANT Resistant     CEFTAZIDIME 8 SENSITIVE Sensitive     CIPROFLOXACIN >=4 RESISTANT Resistant     GENTAMICIN <=1 SENSITIVE Sensitive     IMIPENEM 1 SENSITIVE Sensitive     TRIMETH/SULFA <=20 SENSITIVE Sensitive     AMPICILLIN/SULBACTAM >=32 RESISTANT Resistant     PIP/TAZO Value in next row Sensitive      <=4 SENSITIVEPerformed at [facility] [address], [place], [place] [address]    * FEW MORGANELLA MORGANII     Time coordinating discharge: 35  minutes  SIGNED: Antonieta Pert, MD  [facility] 03/25/2020, 9:26 AM  If 7PM-7AM, please contact night-coverage [email or web address]

## 2020-03-25 NOTE — Progress Notes (Signed)
Antonieta Pert, MD paged regarding the pt wheezing.

## 2020-03-25 NOTE — Progress Notes (Signed)
Referring Physician(s): [place]  Supervising Physician: Jacqulynn Cadet  Patient Status:  [facility] - In-pt  Chief Complaint:  Abdominal pain/right subphrenic abscess  Subjective: Patient resting quietly, tentatively scheduled for discharge to SNF today; denies worsening abdominal pain, nausea, vomiting.   Allergies: Metformin and related, Other, and Soliqua [insulin glargine-lixisenatide]  Medications: Prior to Admission medications   Medication Sig Start Date End Date Taking? Authorizing Provider  aspirin EC 81 MG tablet Take 81 mg by mouth daily.   Yes [provider]  atorvastatin (LIPITOR) 20 MG tablet Take 20 mg by mouth every evening.  11/03/13  Yes [provider]  B Complex Vitamins (B COMPLEX PO) Take 1 tablet by mouth daily.   Yes [provider]  brimonidine (ALPHAGAN) 0.2 % ophthalmic solution Place 1 drop into both eyes 2 (two) times daily. 12/28/19  Yes [provider]  cholecalciferol (VITAMIN D-400) 10 MCG (400 UNIT) TABS tablet Take 400 Units by mouth daily.   Yes [provider]  dorzolamide-timolol (COSOPT) 22.3-6.8 MG/ML ophthalmic solution Place 1 drop into both eyes 2 (two) times daily. 03/04/20  Yes [provider]  gabapentin (NEURONTIN) 600 MG tablet Take 600 mg by mouth 2 (two) times daily. 01/20/20  Yes [provider]  insulin detemir (LEVEMIR) 100 UNIT/ML injection Inject 16 Units into the skin 2 (two) times daily.    Yes [provider]  latanoprost (XALATAN) 0.005 % ophthalmic solution Place 1 drop into both eyes at bedtime.  11/17/14  Yes [provider]  levothyroxine (SYNTHROID) 88 MCG tablet Take 88 mcg by mouth at bedtime.  04/21/11  Yes [provider]  olmesartan (BENICAR) 20 MG tablet Take 20 mg by mouth daily. 02/20/20  Yes [provider]  pioglitazone (ACTOS) 30 MG tablet Take 15 mg by mouth daily. 03/04/20  Yes [provider]  RHOPRESSA  0.02 % SOLN Place 1 drop into both eyes at bedtime. 03/05/20  Yes [provider]  Tamsulosin HCl (FLOMAX) 0.4 MG CAPS Take 0.4 mg by mouth 2 (two) times daily.    Yes [provider]  feeding supplement, GLUCERNA SHAKE, (GLUCERNA SHAKE) LIQD Take 237 mLs by mouth 3 (three) times daily between meals. 03/25/20   Antonieta Pert, MD  glucose blood test strip  04/04/15   [provider]  metoprolol tartrate (LOPRESSOR) 50 MG tablet Take 1 tablet (50 mg total) by mouth 2 (two) times daily. 03/25/20   Antonieta Pert, MD  Multiple Vitamin (MULTIVITAMIN WITH MINERALS) TABS tablet Take 1 tablet by mouth daily. Patient not taking: Reported on 03/23/2019 09/06/13   [name], MD  ondansetron (ZOFRAN ODT) 4 MG disintegrating tablet 4mg  ODT q4 hours prn nausea/vomit Patient not taking: Reported on 11/12/2019 08/24/19   Drenda Freeze, MD  phosphorus (K PHOS NEUTRAL) [phone] MG tablet Take 1 tablet (250 mg total) by mouth 3 (three) times daily for 2 days. 03/25/20 03/27/20  Antonieta Pert, MD  polyethylene glycol (MIRALAX / GLYCOLAX) packet Take 17 g by mouth daily. Patient not taking: Reported on 11/12/2019 09/06/13   [name], MD  sulfamethoxazole-trimethoprim (BACTRIM DS) 800-160 MG tablet Take 1 tablet by mouth 2 (two) times daily for 14 days. Continue through 10/19 03/25/20 04/08/20  Antonieta Pert, MD     Vital Signs: BP 135/65 (BP Location: Left Arm)   Pulse 66   Temp 98.7 F (37.1 C) (Oral)   Resp 16   Ht 5\' 5"  (1.651 m)   Wt 177  lb (80.3 kg)   SpO2 93%   BMI 29.45 kg/m   Physical Exam awake, answering questions okay, right upper quadrant drain intact, insertion site okay, mildly tender, output about 10 cc serosanguineous fluid, drain flushed without difficulty.  Imaging: No results found.  Labs:  CBC: Recent Labs    03/20/20 0536 03/20/20 0536 03/22/20 0654 03/23/20 0409 03/24/20 0335 03/25/20 0336  WBC 10.1  --  10.3  --  8.7 9.0  HGB 13.0   < >  11.6* 10.6* 11.5* 10.8*  HCT 40.3   < > 36.2* 32.9* 35.9* 34.1*  PLT 383  --  347  --  307 315   < > = values in this interval not displayed.    COAGS: Recent Labs    03/19/20 1654  INR 1.3*    BMP: Recent Labs    03/19/20 0641 03/19/20 0641 03/20/20 0536 03/20/20 0536 03/22/20 0654 03/23/20 0409 03/24/20 0335 03/25/20 0336  NA 138   < > 134*   < > 135 137 133* 134*  K 4.4   < > 4.3   < > 3.4* 3.9 3.8 4.0  CL 103   < > 101   < > 107 107 105 107  CO2 24   < > 22   < > 21* 23 20* 21*  GLUCOSE 111*   < > 107*   < > 121* 141* 111* 109*  BUN 12   < > 12   < > 21 19 15 15   CALCIUM 8.6*   < > 8.6*   < > 7.8* 8.0* 7.7* 7.5*  CREATININE 1.35*   < > 1.24   < > 1.41* 1.45* 1.16 1.48*  GFRNONAA 49*   < > 54*   < > 46* 45* 59* 44*  GFRAA 57*  --  >60  --  54* 52*  --   --    < > = values in this interval not displayed.    LIVER FUNCTION TESTS: Recent Labs    02/05/20 0421 02/05/20 0421 03/19/20 5916 03/19/20 3846 03/20/20 0536 03/22/20 0654 03/23/20 0409 03/24/20 0335  BILITOT 0.6  --  0.8  --  1.1 0.5  --   --   AST 19  --  13*  --  15 15  --   --   ALT 15  --  13  --  14 12  --   --   ALKPHOS 112  --  88  --  86 68  --   --   PROT 7.7  --  7.6  --  7.4 6.3*  --   --   ALBUMIN 3.4*   < > 2.7*   < > 2.6* 2.3* 2.2* 2.3*   < > = values in this interval not displayed.    Assessment and Plan: 84 yo male with hxdiabetes, CVA2014, prior small bowel obstruction with resection, polio, hypertension, nephrolithiasis, BPH, arthritis, anemia andpercutaneous cholecystostomy in November 2014 with drain injection in January 2015revealing malpositioning and failed attempt to replace. Patient underwent laparoscopic cholecystectomy on 07/25/2013. He has had prior gallbladder fossa abscess drain, mid epigastric /perigastric abscess drain as well as hepatic abscess drain placementsin 2015; now with right subphrenic fluid collection, status post drain placement on 10/2; afebrile; WBC  normal, hemoglobin 10.8, creat 1.48, drain fluid cultures with few Morganella morganii; antibiotics per primary team; patient will be discharged with abd drain in place- recommend once daily irrigation of drain with 5 cc sterile normal saline,  output recording and dressing changes every 1 to 2 days; patient to be scheduled for follow-up in IR clinic in 1 week  Electronically Signed: D. Rowe Robert, PA-C 03/25/2020, 12:39 PM   I spent a total of 15 minutes at the the patient's bedside AND on the patient's hospital floor or unit, greater than 50% of which was counseling/coordinating care for right subphrenic abscess drain    Patient ID: Mike Mack, male   DOB: January 01, 1936, 84 y.o.   MRN: [id number]

## 2020-03-25 NOTE — Care Management Important Message (Signed)
Important Message  Patient Details IM Letter given to the Patient Name: Mike Mack MRN: [id number] Date of Birth: 06-Feb-1936   Medicare Important Message Given:  Yes     Kerin Salen 03/25/2020, 12:53 PM

## 2020-03-25 NOTE — Discharge Instructions (Signed)
PLEASE FLUSH ABDOMINAL DRAIN WITH 5 CC STERILE NORMAL SALINE ONCE DAILY, RECORD OUTPUT OF DRAIN AND CHANGE DRESSING EVERY 1-2 DAYS; CALL [phone] WITH ANY DRAIN RELATED QUESTIONS

## 2020-03-25 NOTE — Progress Notes (Signed)
Attempted to provide report to the receiving nurse at [facility] SN[facility], but when I called, a recording said they were unable to take report.

## 2020-03-25 NOTE — TOC Transition Note (Signed)
Transition of Care [facility]) - CM/SW Discharge Note   Patient Details  Name: Mike Mack MRN: [id number] Date of Birth: May 08, 1936  Transition of Care [facility]) CM/SW Contact:  Lia Hopping, [place] Phone Number: 03/25/2020, 10:53 AM   Clinical Narrative:    [facility]- will accept the patient pending a negative covid test.  Pt. Spouse will complete admission paperwork at 1:30pm [facility] will transport.     Final next level of care: Skilled Nursing Facility Barriers to Discharge: No Barriers Identified   Patient Goals and CMS Choice Patient states their goals for this hospitalization and ongoing recovery are:: go to rehab before returning home CMS Medicare.gov Compare Post Acute Care list provided to:: Patient Choice offered to / list presented to : Patient  Discharge Placement   Existing PASRR number confirmed : 03/23/20          Patient chooses bed at: [facility] Patient to be transferred to facility by: [place] Name of family member notified: Spouse Mike Mack Patient and family notified of of transfer: 03/25/20  Discharge Plan and Services In-house Referral: Clinical Social Work   Post Acute Care Choice: [place]                               Social Determinants of Health (SDOH) Interventions     Readmission Risk Interventions No flowsheet data found.

## 2020-03-26 ENCOUNTER — Other Ambulatory Visit: Payer: Self-pay | Admitting: Internal Medicine

## 2020-03-26 DIAGNOSIS — N183 Chronic kidney disease, stage 3 unspecified: Secondary | ICD-10-CM | POA: Diagnosis not present

## 2020-03-26 DIAGNOSIS — I639 Cerebral infarction, unspecified: Secondary | ICD-10-CM | POA: Diagnosis not present

## 2020-03-26 DIAGNOSIS — R531 Weakness: Secondary | ICD-10-CM | POA: Diagnosis not present

## 2020-03-26 DIAGNOSIS — K651 Peritoneal abscess: Secondary | ICD-10-CM | POA: Diagnosis not present

## 2020-03-26 DIAGNOSIS — I10 Essential (primary) hypertension: Secondary | ICD-10-CM | POA: Diagnosis not present

## 2020-03-26 DIAGNOSIS — E039 Hypothyroidism, unspecified: Secondary | ICD-10-CM | POA: Diagnosis not present

## 2020-03-26 DIAGNOSIS — E785 Hyperlipidemia, unspecified: Secondary | ICD-10-CM | POA: Diagnosis not present

## 2020-03-26 DIAGNOSIS — G629 Polyneuropathy, unspecified: Secondary | ICD-10-CM | POA: Diagnosis not present

## 2020-03-26 DIAGNOSIS — G14 Postpolio syndrome: Secondary | ICD-10-CM | POA: Diagnosis not present

## 2020-03-26 DIAGNOSIS — F0390 Unspecified dementia without behavioral disturbance: Secondary | ICD-10-CM | POA: Diagnosis not present

## 2020-03-26 DIAGNOSIS — H409 Unspecified glaucoma: Secondary | ICD-10-CM | POA: Diagnosis not present

## 2020-03-26 DIAGNOSIS — E119 Type 2 diabetes mellitus without complications: Secondary | ICD-10-CM | POA: Diagnosis not present

## 2020-03-26 DIAGNOSIS — E1122 Type 2 diabetes mellitus with diabetic chronic kidney disease: Secondary | ICD-10-CM | POA: Diagnosis not present

## 2020-03-26 DIAGNOSIS — N400 Enlarged prostate without lower urinary tract symptoms: Secondary | ICD-10-CM | POA: Diagnosis not present

## 2020-04-05 DIAGNOSIS — H409 Unspecified glaucoma: Secondary | ICD-10-CM | POA: Diagnosis not present

## 2020-04-05 DIAGNOSIS — E119 Type 2 diabetes mellitus without complications: Secondary | ICD-10-CM | POA: Diagnosis not present

## 2020-04-05 DIAGNOSIS — R4181 Age-related cognitive decline: Secondary | ICD-10-CM | POA: Diagnosis not present

## 2020-04-05 DIAGNOSIS — G14 Postpolio syndrome: Secondary | ICD-10-CM | POA: Diagnosis not present

## 2020-04-05 DIAGNOSIS — I639 Cerebral infarction, unspecified: Secondary | ICD-10-CM | POA: Diagnosis not present

## 2020-04-05 DIAGNOSIS — E785 Hyperlipidemia, unspecified: Secondary | ICD-10-CM | POA: Diagnosis not present

## 2020-04-05 DIAGNOSIS — I10 Essential (primary) hypertension: Secondary | ICD-10-CM | POA: Diagnosis not present

## 2020-04-06 ENCOUNTER — Ambulatory Visit
Admission: RE | Admit: 2020-04-06 | Discharge: 2020-04-06 | Disposition: A | Discharge status: Home / Self Care | Payer: Medicare HMO | Source: Ambulatory Visit | Attending: Radiology | Admitting: Radiology

## 2020-04-06 ENCOUNTER — Ambulatory Visit
Admission: RE | Admit: 2020-04-06 | Discharge: 2020-04-06 | Disposition: A | Discharge status: Home / Self Care | Payer: Medicare HMO | Source: Ambulatory Visit | Attending: Internal Medicine | Admitting: Internal Medicine

## 2020-04-06 ENCOUNTER — Encounter: Payer: Self-pay | Admitting: *Deleted

## 2020-04-06 DIAGNOSIS — N1831 Chronic kidney disease, stage 3a: Secondary | ICD-10-CM | POA: Diagnosis not present

## 2020-04-06 DIAGNOSIS — E119 Type 2 diabetes mellitus without complications: Secondary | ICD-10-CM | POA: Diagnosis not present

## 2020-04-06 DIAGNOSIS — G629 Polyneuropathy, unspecified: Secondary | ICD-10-CM | POA: Diagnosis not present

## 2020-04-06 DIAGNOSIS — L02211 Cutaneous abscess of abdominal wall: Secondary | ICD-10-CM | POA: Diagnosis not present

## 2020-04-06 DIAGNOSIS — N281 Cyst of kidney, acquired: Secondary | ICD-10-CM | POA: Diagnosis not present

## 2020-04-06 DIAGNOSIS — K651 Peritoneal abscess: Secondary | ICD-10-CM

## 2020-04-06 DIAGNOSIS — I639 Cerebral infarction, unspecified: Secondary | ICD-10-CM | POA: Diagnosis not present

## 2020-04-06 DIAGNOSIS — E785 Hyperlipidemia, unspecified: Secondary | ICD-10-CM | POA: Diagnosis not present

## 2020-04-06 DIAGNOSIS — Z4803 Encounter for change or removal of drains: Secondary | ICD-10-CM | POA: Diagnosis not present

## 2020-04-06 DIAGNOSIS — E039 Hypothyroidism, unspecified: Secondary | ICD-10-CM | POA: Diagnosis not present

## 2020-04-06 DIAGNOSIS — H409 Unspecified glaucoma: Secondary | ICD-10-CM | POA: Diagnosis not present

## 2020-04-06 DIAGNOSIS — I10 Essential (primary) hypertension: Secondary | ICD-10-CM | POA: Diagnosis not present

## 2020-04-06 DIAGNOSIS — G14 Postpolio syndrome: Secondary | ICD-10-CM | POA: Diagnosis not present

## 2020-04-06 DIAGNOSIS — N400 Enlarged prostate without lower urinary tract symptoms: Secondary | ICD-10-CM | POA: Diagnosis not present

## 2020-04-06 HISTORY — PX: IR RADIOLOGIST EVAL & MGMT: IMG5224

## 2020-04-06 MED ORDER — IOPAMIDOL (ISOVUE-300) INJECTION 61%
100.0000 mL | Freq: Once | INTRAVENOUS | Status: AC | PRN
  Administered 2020-04-06: 100 mL via INTRAVENOUS

## 2020-04-06 NOTE — Progress Notes (Signed)
Chief Complaint: Patient was seen in consultation today for follow up RUQ drain.  History of Present Illness: Mike Mack is a 84 y.o. male who developed RUQ/subphrenic abscess of uncertain etiology. Had drain placed 10/3. Discharged to SNF with drain. Has been doing well aside from poor appetite. States bowels moving well. No pain, N/V, fevers. Not much output from drain past  Past Medical History:  Diagnosis Date  . Anemia 05/13/2013  . Arthritis    "right leg" (07/25/2013)  . BPH (benign prostatic hyperplasia)   . Constipation 05/13/2013  . Exertional shortness of breath    "sometimes" (07/25/2013)  . History of kidney stones   . History of stomach ulcers   . Hypertension   . Neuropathy   . Polio   . Polio Childhood  . Small bowel obstruction ([place])   . Stroke [facility]) 2014   residual:  "left hand kind of numb" (07/25/2013)  . Type II diabetes mellitus ([place])     Past Surgical History:  Procedure Laterality Date  . BOWEL RESECTION    . CATARACT EXTRACTION W/ INTRAOCULAR LENS  IMPLANT, BILATERAL Bilateral 2000's  . CHOLECYSTECTOMY N/A 07/25/2013   Procedure: LAPAROSCOPIC CHOLECYSTECTOMY WITH INTRAOPERATIVE CHOLANGIOGRAM;  Surgeon: [name], MD;  Location: [facility];  Service: General;  Laterality: N/A;  . COLON SURGERY    . COLONOSCOPY     Hx: of  . CYSTOSCOPY WITH BIOPSY N/A 12/01/2019   Procedure: CYSTOSCOPY WITH BLADDER BIOPSY AND FULGURATION;  Surgeon: Lucas Mallow, MD;  Location: [facility];  Service: Urology;  Laterality: N/A;  . EXCISIONAL HEMORRHOIDECTOMY  2000's  . EXTRACORPOREAL SHOCK WAVE LITHOTRIPSY Right 10/03/2018   Procedure: EXTRACORPOREAL SHOCK WAVE LITHOTRIPSY (ESWL);  Surgeon: Festus Aloe, MD;  Location: [facility];  Service: Urology;  Laterality: Right;  . EYE SURGERY    . INGUINAL HERNIA REPAIR Right 1970's  . LAPAROSCOPIC CHOLECYSTECTOMY  07/25/2013   w/LOA (07/25/2013)    Allergies: Metformin and related, Other, and Soliqua [insulin  glargine-lixisenatide]  Medications: Prior to Admission medications   Medication Sig Start Date End Date Taking? Authorizing Provider  aspirin EC 81 MG tablet Take 81 mg by mouth daily.    [provider]  atorvastatin (LIPITOR) 20 MG tablet Take 20 mg by mouth every evening.  11/03/13   [provider]  B Complex Vitamins (B COMPLEX PO) Take 1 tablet by mouth daily.    [provider]  brimonidine (ALPHAGAN) 0.2 % ophthalmic solution Place 1 drop into both eyes 2 (two) times daily. 12/28/19   [provider]  cholecalciferol (VITAMIN D-400) 10 MCG (400 UNIT) TABS tablet Take 400 Units by mouth daily.    [provider]  dorzolamide-timolol (COSOPT) 22.3-6.8 MG/ML ophthalmic solution Place 1 drop into both eyes 2 (two) times daily. 03/04/20   [provider]  feeding supplement, GLUCERNA SHAKE, (GLUCERNA SHAKE) LIQD Take 237 mLs by mouth 3 (three) times daily between meals. 03/25/20   Antonieta Pert, MD  gabapentin (NEURONTIN) 600 MG tablet Take 600 mg by mouth 2 (two) times daily. 01/20/20   [provider]  glucose blood test strip  04/04/15   [provider]  insulin detemir (LEVEMIR) 100 UNIT/ML injection Inject 16 Units into the skin 2 (two) times daily.     [provider]  latanoprost (XALATAN) 0.005 % ophthalmic solution Place 1 drop into both eyes at bedtime.  11/17/14   [provider]  levothyroxine (SYNTHROID) 88 MCG tablet Take 88 mcg  by mouth at bedtime.  04/21/11   [provider]  metoprolol tartrate (LOPRESSOR) 50 MG tablet Take 1 tablet (50 mg total) by mouth 2 (two) times daily. 03/25/20   Antonieta Pert, MD  Multiple Vitamin (MULTIVITAMIN WITH MINERALS) TABS tablet Take 1 tablet by mouth daily. Patient not taking: Reported on 03/23/2019 09/06/13   [name], MD  olmesartan (BENICAR) 20 MG tablet Take 20 mg by mouth daily. 02/20/20   [provider]  ondansetron (ZOFRAN ODT) 4 MG  disintegrating tablet 4mg  ODT q4 hours prn nausea/vomit Patient not taking: Reported on 11/12/2019 08/24/19   Drenda Freeze, MD  pioglitazone (ACTOS) 30 MG tablet Take 15 mg by mouth daily. 03/04/20   [provider]  polyethylene glycol (MIRALAX / GLYCOLAX) packet Take 17 g by mouth daily. Patient not taking: Reported on 11/12/2019 09/06/13   [name], MD  RHOPRESSA 0.02 % SOLN Place 1 drop into both eyes at bedtime. 03/05/20   [provider]  sulfamethoxazole-trimethoprim (BACTRIM DS) 800-160 MG tablet Take 1 tablet by mouth 2 (two) times daily for 14 days. Continue through 10/19 03/25/20 04/08/20  Antonieta Pert, MD  Tamsulosin HCl (FLOMAX) 0.4 MG CAPS Take 0.4 mg by mouth 2 (two) times daily.     [provider]     Family History  Problem Relation Age of Onset  . Heart failure Mother   . Diabetes Sister   . Diabetes Sister   . Diabetes Brother   . Hypertension Sister   . Hypertension Sister   . Hypertension Brother   . Heart attack Sister     Social History   Socioeconomic History  . Marital status: Married    Spouse name: Deundre Thong  . Number of children: 2  . Years of education: Not on file  . Highest education level: Not on file  Occupational History  . Occupation: Retired, Curator  Tobacco Use  . Smoking status: Never Smoker  . Smokeless tobacco: Never Used  Vaping Use  . Vaping Use: Never used  Substance and Sexual Activity  . Alcohol use: No  . Drug use: No  . Sexual activity: Yes  Other Topics Concern  . Not on file  Social History Narrative   Married.  Lives in [place] with wife.  Ambulates with a cane.   Social Determinants of Health   Financial Resource Strain:   . Difficulty of Paying Living Expenses: Not on file  Food Insecurity:   . Worried About Charity fundraiser in the Last Year: Not on file  . Ran Out of Food in the Last Year: Not on file  Transportation Needs:   . Lack of Transportation (Medical):  Not on file  . Lack of Transportation (Non-Medical): Not on file  Physical Activity:   . Days of Exercise per Week: Not on file  . Minutes of Exercise per Session: Not on file  Stress:   . Feeling of Stress : Not on file  Social Connections:   . Frequency of Communication with Friends and Family: Not on file  . Frequency of Social Gatherings with Friends and Family: Not on file  . Attends Religious Services: Not on file  . Active Member of Clubs or Organizations: Not on file  . Attends Archivist Meetings: Not on file  . Marital Status: Not on file      Review of Systems: A 12 point ROS discussed and pertinent positives are indicated in the HPI above.  All other systems are negative.  Review of Systems  Vital Signs: There were no vitals taken for this visit.  Physical Exam Constitutional:      Appearance: Normal appearance.  Abdominal:     General: There is no distension.     Palpations: Abdomen is soft.     Comments: RUQ drain intact, site clean, dry, NT Scant serous output  Neurological:     Mental Status: He is alert.  Psychiatric:        Mood and Affect: Mood normal.        Thought Content: Thought content normal.    Imaging: CT Head Wo Contrast  Result Date: 03/19/2020 CLINICAL DATA:  Mental status change EXAM: CT HEAD WITHOUT CONTRAST TECHNIQUE: Contiguous axial images were obtained from the base of the skull through the vertex without intravenous contrast. COMPARISON:  10/25/2014 head CT and prior. FINDINGS: Brain: No acute infarct or intracranial hemorrhage. No mass lesion. No midline shift or extra-axial fluid collection. Panventricular dilatation is unchanged. Scattered and confluent hypodense foci involving the periventricular and subcortical white matter nonspecific however commonly associated with chronic microvascular ischemic changes. Sequela of more remote bilateral cerebellar insults. Vascular: No hyperdense vessel or unexpected calcification.  Bilateral skull base atherosclerotic calcifications. Skull: Negative for fracture or focal lesion. Sinuses/Orbits: Sequela of bilateral lens replacement. Clear paranasal sinuses. No mastoid effusion. Other: None. IMPRESSION: No acute intracranial process. Remote bilateral cerebellar infarcts. Panventricular dilatation, unchanged. Consider normal pressure hydrocephalus. Chronic microvascular ischemic changes are unchanged. Electronically Signed   By: Primitivo Gauze M.D.   On: 03/19/2020 09:25   CT Abdomen Pelvis W Contrast  Result Date: 03/19/2020 CLINICAL DATA:  Acute nonlocalized abdominal pain EXAM: CT ABDOMEN AND PELVIS WITH CONTRAST TECHNIQUE: Multidetector CT imaging of the abdomen and pelvis was performed using the standard protocol following bolus administration of intravenous contrast. CONTRAST:  16mL OMNIPAQUE IOHEXOL 300 MG/ML  SOLN COMPARISON:  08/24/2019 FINDINGS: Lower chest: Small right more than left pleural effusion and lower lobe atelectasis. Coronary atherosclerosis. Hepatobiliary: No focal liver abnormality.Cholecystectomy. No bile duct dilatation. Pancreas: Unremarkable. Spleen: Unremarkable. Adrenals/Urinary Tract: Negative adrenals. No hydronephrosis or ureteral stone. Punctate right lower pole calculi. Bilateral renal cystic densities which are unchanged. Bladder wall thickening which is circumferential and likely from chronic outlet obstruction. Near calcific density along the mucosal surface anteriorly and inferiorly, also seen previously. Stomach/Bowel: No obstruction. No detected bowel wall thickening, no appendicitis. A small track like soft tissue density structure extends superiorly from the splenic flexure towards the subphrenic collection. Vascular/Lymphatic: No acute vascular abnormality. No mass or adenopathy. Reproductive:Symmetric enlargement of the prostate. Other: Rim enhancing fluid collection in the anterior subphrenic space, contiguous with the right liver.  Collection measures 13 cm in length by 3 cm in thickness. Postoperative right groin, possibly for hernia repair. Fatty umbilical hernia. Musculoskeletal: No acute abnormalities. Asymmetric muscular atrophy involving the right hip. IMPRESSION: 1. Right anterior subphrenic collection measuring up to 13 x 3 cm-most likely an abscess. No clear underlying cause but a colonic source is favored given close positioning of the splenic flexure with small track like structure projecting superiorly towards the collection. No active colon inflammation is seen. 2. Small right more than left pleural effusion, likely sympathetic. 3. Chronic findings are described above. Electronically Signed   By: Monte Fantasia M.D.   On: 03/19/2020 11:14   DG Chest Port 1 View  Result Date: 03/19/2020 CLINICAL DATA:  Cough EXAM: PORTABLE CHEST 1 VIEW COMPARISON:  Oct 25, 2014. FINDINGS: There  are pleural effusions, larger on the right than on the left, with bibasilar atelectasis. The lungs elsewhere are clear. The heart size and pulmonary vascularity are normal. No adenopathy. Note that there is an azygos lobe on the right, an anatomic variant. No bone lesions. IMPRESSION: Small pleural effusions bilaterally, larger on the right than on the left, with bibasilar atelectasis. Lungs elsewhere clear. Heart size normal. Electronically Signed   By: Lowella Grip III M.D.   On: 03/19/2020 07:54   CT IMAGE GUIDED DRAINAGE BY PERCUTANEOUS CATHETER  Result Date: 03/21/2020 INDICATION: 84 year old male with a history subphrenic abscess EXAM: CT GUIDED DRAINAGE OF  ABSCESS MEDICATIONS: The patient is currently admitted to the hospital and receiving intravenous antibiotics. The antibiotics were administered within an appropriate time frame prior to the initiation of the procedure. ANESTHESIA/SEDATION: 0.25 mg IV Versed 50 mcg IV Fentanyl Moderate Sedation Time:  10 minutes The patient was continuously monitored during the procedure by the  interventional radiology nurse under my direct supervision. COMPLICATIONS: None TECHNIQUE: Informed written consent was obtained from the patient after a thorough discussion of the procedural risks, benefits and alternatives. All questions were addressed. Maximal Sterile Barrier Technique was utilized including caps, mask, sterile gowns, sterile gloves, sterile drape, hand hygiene and skin antiseptic. A timeout was performed prior to the initiation of the procedure. PROCEDURE: The operative field was prepped with Chlorhexidine in a sterile fashion, and a sterile drape was applied covering the operative field. A sterile gown and sterile gloves were used for the procedure. Local anesthesia was provided with 1% Lidocaine. Patient was positioned supine position on CT gantry table. Scout CT was acquired for planning purposes. The patient was then prepped and draped in the usual sterile fashion. 1% lidocaine was used for local anesthesia. Using CT guidance, trocar needle was advanced into the right upper quadrant abscess. Once we aspirated purulent material, modified Seldinger technique was used to place a 10 [place] drain. Proximally 50 cc of purulent material aspirated through the pigtail catheter. Ten [place] drain was sutured in place and attached to bulb suction. Final CT image was acquired. Patient tolerated the procedure well and remained hemodynamically stable throughout. No complications were encountered and no significant blood loss. FINDINGS: Right subphrenic abscess, similar to the comparison. Pigtail drainage catheter into the collection with approximately 50 cc of purulent material aspirated. IMPRESSION: Status post CT-guided drainage of right upper quadrant abscess. Signed, Dulcy Fanny. Dellia Nims, RPVI [facility] Electronically Signed   By: Corrie Mckusick D.O.   On: 03/21/2020 04:55    Labs:  CBC: Recent Labs    03/20/20 0536 03/20/20 0536  03/22/20 0654 03/23/20 0409 03/24/20 0335 03/25/20 0336  WBC 10.1  --  10.3  --  8.7 9.0  HGB 13.0   < > 11.6* 10.6* 11.5* 10.8*  HCT 40.3   < > 36.2* 32.9* 35.9* 34.1*  PLT 383  --  347  --  307 315   < > = values in this interval not displayed.    COAGS: Recent Labs    03/19/20 1654  INR 1.3*    BMP: Recent Labs    03/19/20 0641 03/19/20 0641 03/20/20 0536 03/20/20 0536 03/22/20 0654 03/23/20 0409 03/24/20 0335 03/25/20 0336  NA 138   < > 134*   < > 135 137 133* 134*  K 4.4   < > 4.3   < > 3.4* 3.9 3.8 4.0  CL 103   < > 101   < >  107 107 105 107  CO2 24   < > 22   < > 21* 23 20* 21*  GLUCOSE 111*   < > 107*   < > 121* 141* 111* 109*  BUN 12   < > 12   < > 21 19 15 15   CALCIUM 8.6*   < > 8.6*   < > 7.8* 8.0* 7.7* 7.5*  CREATININE 1.35*   < > 1.24   < > 1.41* 1.45* 1.16 1.48*  GFRNONAA 49*   < > 54*   < > 46* 45* 59* 44*  GFRAA 57*  --  >60  --  54* 52*  --   --    < > = values in this interval not displayed.    LIVER FUNCTION TESTS: Recent Labs    02/05/20 0421 02/05/20 0421 03/19/20 9741 03/19/20 6384 03/20/20 0536 03/22/20 0654 03/23/20 0409 03/24/20 0335  BILITOT 0.6  --  0.8  --  1.1 0.5  --   --   AST 19  --  13*  --  15 15  --   --   ALT 15  --  13  --  14 12  --   --   ALKPHOS 112  --  88  --  86 68  --   --   PROT 7.7  --  7.6  --  7.4 6.3*  --   --   ALBUMIN 3.4*   < > 2.7*   < > 2.6* 2.3* 2.2* 2.3*   < > = values in this interval not displayed.    TUMOR MARKERS: No results for input(s): AFPTM, CEA, CA199, CHROMGRNA in the last 8760 hours.  Assessment and Plan: RUQ/subphrenic abscess CT reviewed, abscess resolved Drain removed without complication. No further follow up.  Thank you for this interesting consult.  I greatly enjoyed meeting Serge Main and look forward to participating in their care.  A copy of this report was sent to the requesting provider on this date.  Electronically Signed: Ascencion Dike 04/06/2020, 2:06  PM   I spent a total of 20 minutes in face to face in clinical consultation, greater than 50% of which was counseling/coordinating care for RUQ abscess drain

## 2020-04-18 DIAGNOSIS — M6259 Muscle wasting and atrophy, not elsewhere classified, multiple sites: Secondary | ICD-10-CM | POA: Diagnosis not present

## 2020-04-18 DIAGNOSIS — N1831 Chronic kidney disease, stage 3a: Secondary | ICD-10-CM | POA: Diagnosis not present

## 2020-04-18 DIAGNOSIS — E1122 Type 2 diabetes mellitus with diabetic chronic kidney disease: Secondary | ICD-10-CM | POA: Diagnosis not present

## 2020-04-18 DIAGNOSIS — E785 Hyperlipidemia, unspecified: Secondary | ICD-10-CM | POA: Diagnosis not present

## 2020-04-18 DIAGNOSIS — E1140 Type 2 diabetes mellitus with diabetic neuropathy, unspecified: Secondary | ICD-10-CM | POA: Diagnosis not present

## 2020-04-18 DIAGNOSIS — I129 Hypertensive chronic kidney disease with stage 1 through stage 4 chronic kidney disease, or unspecified chronic kidney disease: Secondary | ICD-10-CM | POA: Diagnosis not present

## 2020-04-18 DIAGNOSIS — E039 Hypothyroidism, unspecified: Secondary | ICD-10-CM | POA: Diagnosis not present

## 2020-04-18 DIAGNOSIS — E113553 Type 2 diabetes mellitus with stable proliferative diabetic retinopathy, bilateral: Secondary | ICD-10-CM | POA: Diagnosis not present

## 2020-04-18 DIAGNOSIS — N400 Enlarged prostate without lower urinary tract symptoms: Secondary | ICD-10-CM | POA: Diagnosis not present

## 2020-04-19 DIAGNOSIS — E1140 Type 2 diabetes mellitus with diabetic neuropathy, unspecified: Secondary | ICD-10-CM | POA: Diagnosis not present

## 2020-04-19 DIAGNOSIS — N400 Enlarged prostate without lower urinary tract symptoms: Secondary | ICD-10-CM | POA: Diagnosis not present

## 2020-04-19 DIAGNOSIS — E039 Hypothyroidism, unspecified: Secondary | ICD-10-CM | POA: Diagnosis not present

## 2020-04-19 DIAGNOSIS — E785 Hyperlipidemia, unspecified: Secondary | ICD-10-CM | POA: Diagnosis not present

## 2020-04-19 DIAGNOSIS — E113553 Type 2 diabetes mellitus with stable proliferative diabetic retinopathy, bilateral: Secondary | ICD-10-CM | POA: Diagnosis not present

## 2020-04-19 DIAGNOSIS — E1122 Type 2 diabetes mellitus with diabetic chronic kidney disease: Secondary | ICD-10-CM | POA: Diagnosis not present

## 2020-04-19 DIAGNOSIS — N1831 Chronic kidney disease, stage 3a: Secondary | ICD-10-CM | POA: Diagnosis not present

## 2020-04-19 DIAGNOSIS — M6259 Muscle wasting and atrophy, not elsewhere classified, multiple sites: Secondary | ICD-10-CM | POA: Diagnosis not present

## 2020-04-19 DIAGNOSIS — I129 Hypertensive chronic kidney disease with stage 1 through stage 4 chronic kidney disease, or unspecified chronic kidney disease: Secondary | ICD-10-CM | POA: Diagnosis not present

## 2020-04-21 DIAGNOSIS — E1122 Type 2 diabetes mellitus with diabetic chronic kidney disease: Secondary | ICD-10-CM | POA: Diagnosis not present

## 2020-04-21 DIAGNOSIS — E1140 Type 2 diabetes mellitus with diabetic neuropathy, unspecified: Secondary | ICD-10-CM | POA: Diagnosis not present

## 2020-04-21 DIAGNOSIS — M6259 Muscle wasting and atrophy, not elsewhere classified, multiple sites: Secondary | ICD-10-CM | POA: Diagnosis not present

## 2020-04-21 DIAGNOSIS — N1831 Chronic kidney disease, stage 3a: Secondary | ICD-10-CM | POA: Diagnosis not present

## 2020-04-21 DIAGNOSIS — I129 Hypertensive chronic kidney disease with stage 1 through stage 4 chronic kidney disease, or unspecified chronic kidney disease: Secondary | ICD-10-CM | POA: Diagnosis not present

## 2020-04-21 DIAGNOSIS — E039 Hypothyroidism, unspecified: Secondary | ICD-10-CM | POA: Diagnosis not present

## 2020-04-21 DIAGNOSIS — E785 Hyperlipidemia, unspecified: Secondary | ICD-10-CM | POA: Diagnosis not present

## 2020-04-21 DIAGNOSIS — E113553 Type 2 diabetes mellitus with stable proliferative diabetic retinopathy, bilateral: Secondary | ICD-10-CM | POA: Diagnosis not present

## 2020-04-21 DIAGNOSIS — N400 Enlarged prostate without lower urinary tract symptoms: Secondary | ICD-10-CM | POA: Diagnosis not present

## 2020-04-23 DIAGNOSIS — I129 Hypertensive chronic kidney disease with stage 1 through stage 4 chronic kidney disease, or unspecified chronic kidney disease: Secondary | ICD-10-CM | POA: Diagnosis not present

## 2020-04-23 DIAGNOSIS — E039 Hypothyroidism, unspecified: Secondary | ICD-10-CM | POA: Diagnosis not present

## 2020-04-23 DIAGNOSIS — E1122 Type 2 diabetes mellitus with diabetic chronic kidney disease: Secondary | ICD-10-CM | POA: Diagnosis not present

## 2020-04-23 DIAGNOSIS — E113553 Type 2 diabetes mellitus with stable proliferative diabetic retinopathy, bilateral: Secondary | ICD-10-CM | POA: Diagnosis not present

## 2020-04-23 DIAGNOSIS — E785 Hyperlipidemia, unspecified: Secondary | ICD-10-CM | POA: Diagnosis not present

## 2020-04-23 DIAGNOSIS — M6259 Muscle wasting and atrophy, not elsewhere classified, multiple sites: Secondary | ICD-10-CM | POA: Diagnosis not present

## 2020-04-23 DIAGNOSIS — E1140 Type 2 diabetes mellitus with diabetic neuropathy, unspecified: Secondary | ICD-10-CM | POA: Diagnosis not present

## 2020-04-23 DIAGNOSIS — N1831 Chronic kidney disease, stage 3a: Secondary | ICD-10-CM | POA: Diagnosis not present

## 2020-04-23 DIAGNOSIS — N400 Enlarged prostate without lower urinary tract symptoms: Secondary | ICD-10-CM | POA: Diagnosis not present

## 2020-04-29 DIAGNOSIS — I639 Cerebral infarction, unspecified: Secondary | ICD-10-CM | POA: Diagnosis not present

## 2020-04-29 DIAGNOSIS — E039 Hypothyroidism, unspecified: Secondary | ICD-10-CM | POA: Diagnosis not present

## 2020-04-29 DIAGNOSIS — I10 Essential (primary) hypertension: Secondary | ICD-10-CM | POA: Diagnosis not present

## 2020-04-29 DIAGNOSIS — E1121 Type 2 diabetes mellitus with diabetic nephropathy: Secondary | ICD-10-CM | POA: Diagnosis not present

## 2020-04-30 DIAGNOSIS — Z8673 Personal history of transient ischemic attack (TIA), and cerebral infarction without residual deficits: Secondary | ICD-10-CM | POA: Diagnosis not present

## 2020-04-30 DIAGNOSIS — I129 Hypertensive chronic kidney disease with stage 1 through stage 4 chronic kidney disease, or unspecified chronic kidney disease: Secondary | ICD-10-CM | POA: Diagnosis not present

## 2020-04-30 DIAGNOSIS — M6259 Muscle wasting and atrophy, not elsewhere classified, multiple sites: Secondary | ICD-10-CM | POA: Diagnosis not present

## 2020-04-30 DIAGNOSIS — E785 Hyperlipidemia, unspecified: Secondary | ICD-10-CM | POA: Diagnosis not present

## 2020-04-30 DIAGNOSIS — E1140 Type 2 diabetes mellitus with diabetic neuropathy, unspecified: Secondary | ICD-10-CM | POA: Diagnosis not present

## 2020-04-30 DIAGNOSIS — G43009 Migraine without aura, not intractable, without status migrainosus: Secondary | ICD-10-CM | POA: Diagnosis not present

## 2020-04-30 DIAGNOSIS — E1122 Type 2 diabetes mellitus with diabetic chronic kidney disease: Secondary | ICD-10-CM | POA: Diagnosis not present

## 2020-04-30 DIAGNOSIS — N400 Enlarged prostate without lower urinary tract symptoms: Secondary | ICD-10-CM | POA: Diagnosis not present

## 2020-04-30 DIAGNOSIS — E118 Type 2 diabetes mellitus with unspecified complications: Secondary | ICD-10-CM | POA: Diagnosis not present

## 2020-04-30 DIAGNOSIS — N1831 Chronic kidney disease, stage 3a: Secondary | ICD-10-CM | POA: Diagnosis not present

## 2020-04-30 DIAGNOSIS — E039 Hypothyroidism, unspecified: Secondary | ICD-10-CM | POA: Diagnosis not present

## 2020-04-30 DIAGNOSIS — E113553 Type 2 diabetes mellitus with stable proliferative diabetic retinopathy, bilateral: Secondary | ICD-10-CM | POA: Diagnosis not present

## 2020-05-06 ENCOUNTER — Encounter (INDEPENDENT_AMBULATORY_CARE_PROVIDER_SITE_OTHER): Payer: Self-pay

## 2020-05-06 DIAGNOSIS — N400 Enlarged prostate without lower urinary tract symptoms: Secondary | ICD-10-CM | POA: Diagnosis not present

## 2020-05-06 DIAGNOSIS — M6259 Muscle wasting and atrophy, not elsewhere classified, multiple sites: Secondary | ICD-10-CM | POA: Diagnosis not present

## 2020-05-06 DIAGNOSIS — E1140 Type 2 diabetes mellitus with diabetic neuropathy, unspecified: Secondary | ICD-10-CM | POA: Diagnosis not present

## 2020-05-06 DIAGNOSIS — E039 Hypothyroidism, unspecified: Secondary | ICD-10-CM | POA: Diagnosis not present

## 2020-05-06 DIAGNOSIS — I129 Hypertensive chronic kidney disease with stage 1 through stage 4 chronic kidney disease, or unspecified chronic kidney disease: Secondary | ICD-10-CM | POA: Diagnosis not present

## 2020-05-06 DIAGNOSIS — E113553 Type 2 diabetes mellitus with stable proliferative diabetic retinopathy, bilateral: Secondary | ICD-10-CM | POA: Diagnosis not present

## 2020-05-06 DIAGNOSIS — E785 Hyperlipidemia, unspecified: Secondary | ICD-10-CM | POA: Diagnosis not present

## 2020-05-06 DIAGNOSIS — H401133 Primary open-angle glaucoma, bilateral, severe stage: Secondary | ICD-10-CM | POA: Diagnosis not present

## 2020-05-06 DIAGNOSIS — H462 Nutritional optic neuropathy: Secondary | ICD-10-CM | POA: Diagnosis not present

## 2020-05-06 DIAGNOSIS — N1831 Chronic kidney disease, stage 3a: Secondary | ICD-10-CM | POA: Diagnosis not present

## 2020-05-06 DIAGNOSIS — E1122 Type 2 diabetes mellitus with diabetic chronic kidney disease: Secondary | ICD-10-CM | POA: Diagnosis not present

## 2020-05-07 DIAGNOSIS — N1831 Chronic kidney disease, stage 3a: Secondary | ICD-10-CM | POA: Diagnosis not present

## 2020-05-07 DIAGNOSIS — N400 Enlarged prostate without lower urinary tract symptoms: Secondary | ICD-10-CM | POA: Diagnosis not present

## 2020-05-07 DIAGNOSIS — M6259 Muscle wasting and atrophy, not elsewhere classified, multiple sites: Secondary | ICD-10-CM | POA: Diagnosis not present

## 2020-05-07 DIAGNOSIS — E1140 Type 2 diabetes mellitus with diabetic neuropathy, unspecified: Secondary | ICD-10-CM | POA: Diagnosis not present

## 2020-05-07 DIAGNOSIS — E039 Hypothyroidism, unspecified: Secondary | ICD-10-CM | POA: Diagnosis not present

## 2020-05-07 DIAGNOSIS — E785 Hyperlipidemia, unspecified: Secondary | ICD-10-CM | POA: Diagnosis not present

## 2020-05-07 DIAGNOSIS — E113553 Type 2 diabetes mellitus with stable proliferative diabetic retinopathy, bilateral: Secondary | ICD-10-CM | POA: Diagnosis not present

## 2020-05-07 DIAGNOSIS — E1122 Type 2 diabetes mellitus with diabetic chronic kidney disease: Secondary | ICD-10-CM | POA: Diagnosis not present

## 2020-05-07 DIAGNOSIS — I129 Hypertensive chronic kidney disease with stage 1 through stage 4 chronic kidney disease, or unspecified chronic kidney disease: Secondary | ICD-10-CM | POA: Diagnosis not present

## 2020-05-14 DIAGNOSIS — I129 Hypertensive chronic kidney disease with stage 1 through stage 4 chronic kidney disease, or unspecified chronic kidney disease: Secondary | ICD-10-CM | POA: Diagnosis not present

## 2020-05-14 DIAGNOSIS — E113553 Type 2 diabetes mellitus with stable proliferative diabetic retinopathy, bilateral: Secondary | ICD-10-CM | POA: Diagnosis not present

## 2020-05-14 DIAGNOSIS — E039 Hypothyroidism, unspecified: Secondary | ICD-10-CM | POA: Diagnosis not present

## 2020-05-14 DIAGNOSIS — N400 Enlarged prostate without lower urinary tract symptoms: Secondary | ICD-10-CM | POA: Diagnosis not present

## 2020-05-14 DIAGNOSIS — E1122 Type 2 diabetes mellitus with diabetic chronic kidney disease: Secondary | ICD-10-CM | POA: Diagnosis not present

## 2020-05-14 DIAGNOSIS — M6259 Muscle wasting and atrophy, not elsewhere classified, multiple sites: Secondary | ICD-10-CM | POA: Diagnosis not present

## 2020-05-14 DIAGNOSIS — N1831 Chronic kidney disease, stage 3a: Secondary | ICD-10-CM | POA: Diagnosis not present

## 2020-05-14 DIAGNOSIS — E1140 Type 2 diabetes mellitus with diabetic neuropathy, unspecified: Secondary | ICD-10-CM | POA: Diagnosis not present

## 2020-05-14 DIAGNOSIS — E785 Hyperlipidemia, unspecified: Secondary | ICD-10-CM | POA: Diagnosis not present

## 2020-05-21 DIAGNOSIS — L603 Nail dystrophy: Secondary | ICD-10-CM | POA: Diagnosis not present

## 2020-05-21 DIAGNOSIS — I10 Essential (primary) hypertension: Secondary | ICD-10-CM | POA: Diagnosis not present

## 2020-05-21 DIAGNOSIS — E11319 Type 2 diabetes mellitus with unspecified diabetic retinopathy without macular edema: Secondary | ICD-10-CM | POA: Diagnosis not present

## 2020-05-21 DIAGNOSIS — E1151 Type 2 diabetes mellitus with diabetic peripheral angiopathy without gangrene: Secondary | ICD-10-CM | POA: Diagnosis not present

## 2020-05-21 DIAGNOSIS — E039 Hypothyroidism, unspecified: Secondary | ICD-10-CM | POA: Diagnosis not present

## 2020-05-21 DIAGNOSIS — B351 Tinea unguium: Secondary | ICD-10-CM | POA: Diagnosis not present

## 2020-05-21 DIAGNOSIS — M79674 Pain in right toe(s): Secondary | ICD-10-CM | POA: Diagnosis not present

## 2020-05-21 DIAGNOSIS — E118 Type 2 diabetes mellitus with unspecified complications: Secondary | ICD-10-CM | POA: Diagnosis not present

## 2020-05-21 DIAGNOSIS — I639 Cerebral infarction, unspecified: Secondary | ICD-10-CM | POA: Diagnosis not present

## 2020-05-26 DIAGNOSIS — E11319 Type 2 diabetes mellitus with unspecified diabetic retinopathy without macular edema: Secondary | ICD-10-CM | POA: Diagnosis not present

## 2020-05-26 DIAGNOSIS — I10 Essential (primary) hypertension: Secondary | ICD-10-CM | POA: Diagnosis not present

## 2020-05-26 DIAGNOSIS — G301 Alzheimer's disease with late onset: Secondary | ICD-10-CM | POA: Diagnosis not present

## 2020-05-26 DIAGNOSIS — I639 Cerebral infarction, unspecified: Secondary | ICD-10-CM | POA: Diagnosis not present

## 2020-05-26 DIAGNOSIS — F0280 Dementia in other diseases classified elsewhere without behavioral disturbance: Secondary | ICD-10-CM | POA: Diagnosis not present

## 2020-05-26 DIAGNOSIS — E1121 Type 2 diabetes mellitus with diabetic nephropathy: Secondary | ICD-10-CM | POA: Diagnosis not present

## 2020-05-26 DIAGNOSIS — E039 Hypothyroidism, unspecified: Secondary | ICD-10-CM | POA: Diagnosis not present

## 2020-05-28 DIAGNOSIS — N400 Enlarged prostate without lower urinary tract symptoms: Secondary | ICD-10-CM | POA: Diagnosis not present

## 2020-05-28 DIAGNOSIS — E1122 Type 2 diabetes mellitus with diabetic chronic kidney disease: Secondary | ICD-10-CM | POA: Diagnosis not present

## 2020-05-28 DIAGNOSIS — N1831 Chronic kidney disease, stage 3a: Secondary | ICD-10-CM | POA: Diagnosis not present

## 2020-05-28 DIAGNOSIS — M6259 Muscle wasting and atrophy, not elsewhere classified, multiple sites: Secondary | ICD-10-CM | POA: Diagnosis not present

## 2020-05-28 DIAGNOSIS — I129 Hypertensive chronic kidney disease with stage 1 through stage 4 chronic kidney disease, or unspecified chronic kidney disease: Secondary | ICD-10-CM | POA: Diagnosis not present

## 2020-05-28 DIAGNOSIS — E039 Hypothyroidism, unspecified: Secondary | ICD-10-CM | POA: Diagnosis not present

## 2020-05-28 DIAGNOSIS — E113553 Type 2 diabetes mellitus with stable proliferative diabetic retinopathy, bilateral: Secondary | ICD-10-CM | POA: Diagnosis not present

## 2020-05-28 DIAGNOSIS — E785 Hyperlipidemia, unspecified: Secondary | ICD-10-CM | POA: Diagnosis not present

## 2020-05-28 DIAGNOSIS — E1140 Type 2 diabetes mellitus with diabetic neuropathy, unspecified: Secondary | ICD-10-CM | POA: Diagnosis not present

## 2020-06-04 DIAGNOSIS — E113553 Type 2 diabetes mellitus with stable proliferative diabetic retinopathy, bilateral: Secondary | ICD-10-CM | POA: Diagnosis not present

## 2020-06-04 DIAGNOSIS — M6259 Muscle wasting and atrophy, not elsewhere classified, multiple sites: Secondary | ICD-10-CM | POA: Diagnosis not present

## 2020-06-04 DIAGNOSIS — N1831 Chronic kidney disease, stage 3a: Secondary | ICD-10-CM | POA: Diagnosis not present

## 2020-06-04 DIAGNOSIS — E1140 Type 2 diabetes mellitus with diabetic neuropathy, unspecified: Secondary | ICD-10-CM | POA: Diagnosis not present

## 2020-06-04 DIAGNOSIS — N400 Enlarged prostate without lower urinary tract symptoms: Secondary | ICD-10-CM | POA: Diagnosis not present

## 2020-06-04 DIAGNOSIS — E1122 Type 2 diabetes mellitus with diabetic chronic kidney disease: Secondary | ICD-10-CM | POA: Diagnosis not present

## 2020-06-04 DIAGNOSIS — I129 Hypertensive chronic kidney disease with stage 1 through stage 4 chronic kidney disease, or unspecified chronic kidney disease: Secondary | ICD-10-CM | POA: Diagnosis not present

## 2020-06-04 DIAGNOSIS — E039 Hypothyroidism, unspecified: Secondary | ICD-10-CM | POA: Diagnosis not present

## 2020-06-04 DIAGNOSIS — E785 Hyperlipidemia, unspecified: Secondary | ICD-10-CM | POA: Diagnosis not present

## 2020-06-10 DIAGNOSIS — E1140 Type 2 diabetes mellitus with diabetic neuropathy, unspecified: Secondary | ICD-10-CM | POA: Diagnosis not present

## 2020-06-10 DIAGNOSIS — E113553 Type 2 diabetes mellitus with stable proliferative diabetic retinopathy, bilateral: Secondary | ICD-10-CM | POA: Diagnosis not present

## 2020-06-10 DIAGNOSIS — I129 Hypertensive chronic kidney disease with stage 1 through stage 4 chronic kidney disease, or unspecified chronic kidney disease: Secondary | ICD-10-CM | POA: Diagnosis not present

## 2020-06-10 DIAGNOSIS — E039 Hypothyroidism, unspecified: Secondary | ICD-10-CM | POA: Diagnosis not present

## 2020-06-10 DIAGNOSIS — N400 Enlarged prostate without lower urinary tract symptoms: Secondary | ICD-10-CM | POA: Diagnosis not present

## 2020-06-10 DIAGNOSIS — N1831 Chronic kidney disease, stage 3a: Secondary | ICD-10-CM | POA: Diagnosis not present

## 2020-06-10 DIAGNOSIS — E785 Hyperlipidemia, unspecified: Secondary | ICD-10-CM | POA: Diagnosis not present

## 2020-06-10 DIAGNOSIS — E1122 Type 2 diabetes mellitus with diabetic chronic kidney disease: Secondary | ICD-10-CM | POA: Diagnosis not present

## 2020-06-10 DIAGNOSIS — M6259 Muscle wasting and atrophy, not elsewhere classified, multiple sites: Secondary | ICD-10-CM | POA: Diagnosis not present

## 2020-06-15 DIAGNOSIS — N1831 Chronic kidney disease, stage 3a: Secondary | ICD-10-CM | POA: Diagnosis not present

## 2020-06-15 DIAGNOSIS — M6259 Muscle wasting and atrophy, not elsewhere classified, multiple sites: Secondary | ICD-10-CM | POA: Diagnosis not present

## 2020-06-15 DIAGNOSIS — E1140 Type 2 diabetes mellitus with diabetic neuropathy, unspecified: Secondary | ICD-10-CM | POA: Diagnosis not present

## 2020-06-15 DIAGNOSIS — E785 Hyperlipidemia, unspecified: Secondary | ICD-10-CM | POA: Diagnosis not present

## 2020-06-15 DIAGNOSIS — E039 Hypothyroidism, unspecified: Secondary | ICD-10-CM | POA: Diagnosis not present

## 2020-06-15 DIAGNOSIS — E1122 Type 2 diabetes mellitus with diabetic chronic kidney disease: Secondary | ICD-10-CM | POA: Diagnosis not present

## 2020-06-15 DIAGNOSIS — I129 Hypertensive chronic kidney disease with stage 1 through stage 4 chronic kidney disease, or unspecified chronic kidney disease: Secondary | ICD-10-CM | POA: Diagnosis not present

## 2020-06-15 DIAGNOSIS — E113553 Type 2 diabetes mellitus with stable proliferative diabetic retinopathy, bilateral: Secondary | ICD-10-CM | POA: Diagnosis not present

## 2020-06-15 DIAGNOSIS — N400 Enlarged prostate without lower urinary tract symptoms: Secondary | ICD-10-CM | POA: Diagnosis not present

## 2020-07-12 DIAGNOSIS — E11319 Type 2 diabetes mellitus with unspecified diabetic retinopathy without macular edema: Secondary | ICD-10-CM | POA: Diagnosis not present

## 2020-07-12 DIAGNOSIS — I10 Essential (primary) hypertension: Secondary | ICD-10-CM | POA: Diagnosis not present

## 2020-07-12 DIAGNOSIS — E1151 Type 2 diabetes mellitus with diabetic peripheral angiopathy without gangrene: Secondary | ICD-10-CM | POA: Diagnosis not present

## 2020-07-12 DIAGNOSIS — G301 Alzheimer's disease with late onset: Secondary | ICD-10-CM | POA: Diagnosis not present

## 2020-07-12 DIAGNOSIS — E039 Hypothyroidism, unspecified: Secondary | ICD-10-CM | POA: Diagnosis not present

## 2020-07-12 DIAGNOSIS — I739 Peripheral vascular disease, unspecified: Secondary | ICD-10-CM | POA: Diagnosis not present

## 2020-07-12 DIAGNOSIS — E118 Type 2 diabetes mellitus with unspecified complications: Secondary | ICD-10-CM | POA: Diagnosis not present

## 2020-07-12 DIAGNOSIS — R531 Weakness: Secondary | ICD-10-CM | POA: Diagnosis not present

## 2020-07-12 DIAGNOSIS — E1169 Type 2 diabetes mellitus with other specified complication: Secondary | ICD-10-CM | POA: Diagnosis not present

## 2020-07-12 DIAGNOSIS — I69998 Other sequelae following unspecified cerebrovascular disease: Secondary | ICD-10-CM | POA: Diagnosis not present

## 2020-07-12 DIAGNOSIS — E1121 Type 2 diabetes mellitus with diabetic nephropathy: Secondary | ICD-10-CM | POA: Diagnosis not present

## 2020-07-12 DIAGNOSIS — E1140 Type 2 diabetes mellitus with diabetic neuropathy, unspecified: Secondary | ICD-10-CM | POA: Diagnosis not present

## 2020-08-06 DIAGNOSIS — R972 Elevated prostate specific antigen [PSA]: Secondary | ICD-10-CM | POA: Diagnosis not present

## 2020-08-13 DIAGNOSIS — N3000 Acute cystitis without hematuria: Secondary | ICD-10-CM | POA: Diagnosis not present

## 2020-08-13 DIAGNOSIS — R300 Dysuria: Secondary | ICD-10-CM | POA: Diagnosis not present

## 2020-08-13 DIAGNOSIS — R3915 Urgency of urination: Secondary | ICD-10-CM | POA: Diagnosis not present

## 2020-08-16 DIAGNOSIS — H04123 Dry eye syndrome of bilateral lacrimal glands: Secondary | ICD-10-CM | POA: Diagnosis not present

## 2020-08-18 ENCOUNTER — Encounter (INDEPENDENT_AMBULATORY_CARE_PROVIDER_SITE_OTHER): Payer: Self-pay

## 2020-08-20 DIAGNOSIS — M79674 Pain in right toe(s): Secondary | ICD-10-CM | POA: Diagnosis not present

## 2020-08-20 DIAGNOSIS — B351 Tinea unguium: Secondary | ICD-10-CM | POA: Diagnosis not present

## 2020-08-20 DIAGNOSIS — L603 Nail dystrophy: Secondary | ICD-10-CM | POA: Diagnosis not present

## 2020-08-20 DIAGNOSIS — E1151 Type 2 diabetes mellitus with diabetic peripheral angiopathy without gangrene: Secondary | ICD-10-CM | POA: Diagnosis not present

## 2020-08-26 DIAGNOSIS — I10 Essential (primary) hypertension: Secondary | ICD-10-CM | POA: Diagnosis not present

## 2020-08-26 DIAGNOSIS — E118 Type 2 diabetes mellitus with unspecified complications: Secondary | ICD-10-CM | POA: Diagnosis not present

## 2020-08-26 DIAGNOSIS — E1121 Type 2 diabetes mellitus with diabetic nephropathy: Secondary | ICD-10-CM | POA: Diagnosis not present

## 2020-08-26 DIAGNOSIS — E039 Hypothyroidism, unspecified: Secondary | ICD-10-CM | POA: Diagnosis not present

## 2020-09-01 DIAGNOSIS — I10 Essential (primary) hypertension: Secondary | ICD-10-CM | POA: Diagnosis not present

## 2020-09-01 DIAGNOSIS — K5900 Constipation, unspecified: Secondary | ICD-10-CM | POA: Diagnosis not present

## 2020-09-01 DIAGNOSIS — Z794 Long term (current) use of insulin: Secondary | ICD-10-CM | POA: Diagnosis not present

## 2020-09-01 DIAGNOSIS — E1121 Type 2 diabetes mellitus with diabetic nephropathy: Secondary | ICD-10-CM | POA: Diagnosis not present

## 2020-09-01 DIAGNOSIS — E782 Mixed hyperlipidemia: Secondary | ICD-10-CM | POA: Diagnosis not present

## 2020-09-08 DIAGNOSIS — Z794 Long term (current) use of insulin: Secondary | ICD-10-CM | POA: Diagnosis not present

## 2020-09-08 DIAGNOSIS — I10 Essential (primary) hypertension: Secondary | ICD-10-CM | POA: Diagnosis not present

## 2020-09-08 DIAGNOSIS — K5900 Constipation, unspecified: Secondary | ICD-10-CM | POA: Diagnosis not present

## 2020-09-08 DIAGNOSIS — E1121 Type 2 diabetes mellitus with diabetic nephropathy: Secondary | ICD-10-CM | POA: Diagnosis not present

## 2020-09-08 DIAGNOSIS — E782 Mixed hyperlipidemia: Secondary | ICD-10-CM | POA: Diagnosis not present

## 2020-09-09 ENCOUNTER — Ambulatory Visit (INDEPENDENT_AMBULATORY_CARE_PROVIDER_SITE_OTHER): Payer: Medicare HMO | Admitting: Ophthalmology

## 2020-09-09 ENCOUNTER — Encounter (INDEPENDENT_AMBULATORY_CARE_PROVIDER_SITE_OTHER): Payer: Self-pay | Admitting: Ophthalmology

## 2020-09-09 ENCOUNTER — Other Ambulatory Visit: Payer: Self-pay

## 2020-09-09 DIAGNOSIS — E113553 Type 2 diabetes mellitus with stable proliferative diabetic retinopathy, bilateral: Secondary | ICD-10-CM | POA: Diagnosis not present

## 2020-09-09 DIAGNOSIS — Z794 Long term (current) use of insulin: Secondary | ICD-10-CM | POA: Diagnosis not present

## 2020-09-09 NOTE — Assessment & Plan Note (Signed)
The nature of regressed proliferative diabetic retinopathy was discussed with the patient. The patient was advised to maintain good glucose, blood pressure, monitor kidney function and serum lipid control as advised by personal physician. Rare risk for reactivation of progression exist with untreated severe anemia, untreated renal failure, untreated heart failure, and smoking. Complete avoidance of smoking was recommended. The chance of recurrent proliferative diabetic retinopathy was discussed as well as the chance of vitreous hemorrhage for which further treatments may be necessary.   Explained to the patient that the quiescent  proliferative diabetic retinopathy disease is unlikely to ever worsen.  Worsening factors would include however severe anemia, hypertension out-of-control or impending renal failure.  The nature of regressed diabetic macular edema was discussed with the patient as well as the possibility of recurrence. The importance of the tight glucose, blood pressure, and serum lipid control were emphasized. The importance of compliance with follow up was emphasized. The patient was advised to avoid smoking.  Currently the swelling in macula is controlled, and improved from prior visits.

## 2020-09-09 NOTE — Progress Notes (Signed)
09/09/2020     CHIEF COMPLAINT Patient presents for Retina Follow Up (9 Mo F/U OU//Pt c/o worsening DVA OU gradually over time. No other new symptoms reported. NVA stable OU per pt./A1c: 8.0, 08/2020/LBS: 150 this AM)   HISTORY OF PRESENT ILLNESS: Mike Mack is a 85 y.o. male who presents to the clinic today for:   HPI    Retina Follow Up    Patient presents with  Diabetic Retinopathy.  In both eyes.  This started 9 months ago.  Severity is mild.  Duration of 9 months.  Since onset it is gradually worsening. Additional comments: 9 Mo F/U OU  Pt c/o worsening DVA OU gradually over time. No other new symptoms reported. NVA stable OU per pt. A1c: 8.0, 08/2020 LBS: 150 this AM          Comments    No interval change in visual acuity       Last edited by Hurman Horn, MD on 09/09/2020  2:08 PM. (History)      Referring physician: Jani Gravel, MD [address] [place],  [place] [address]  HISTORICAL INFORMATION:   Selected notes from the MEDICAL RECORD NUMBER    Lab Results  Component Value Date   HGBA1C 8.4 (H) 03/19/2020     CURRENT MEDICATIONS: Current Outpatient Medications (Ophthalmic Drugs)  Medication Sig  . brimonidine (ALPHAGAN) 0.2 % ophthalmic solution Place 1 drop into both eyes 2 (two) times daily.  Marland Kitchen latanoprost (XALATAN) 0.005 % ophthalmic solution Place 1 drop into both eyes at bedtime.   . RHOPRESSA 0.02 % SOLN Place 1 drop into both eyes at bedtime.  . dorzolamide-timolol (COSOPT) 22.3-6.8 MG/ML ophthalmic solution Place 1 drop into both eyes 2 (two) times daily.   No current facility-administered medications for this visit. (Ophthalmic Drugs)   Current Outpatient Medications (Other)  Medication Sig  . aspirin EC 81 MG tablet Take 81 mg by mouth daily.  Marland Kitchen atorvastatin (LIPITOR) 20 MG tablet Take 20 mg by mouth every evening.   . B Complex Vitamins (B COMPLEX PO) Take 1 tablet by mouth daily.  . cholecalciferol (VITAMIN D-400) 10  MCG (400 UNIT) TABS tablet Take 400 Units by mouth daily.  . feeding supplement, GLUCERNA SHAKE, (GLUCERNA SHAKE) LIQD Take 237 mLs by mouth 3 (three) times daily between meals.  . gabapentin (NEURONTIN) 600 MG tablet Take 600 mg by mouth 2 (two) times daily.  Marland Kitchen glucose blood test strip   . insulin detemir (LEVEMIR) 100 UNIT/ML injection Inject 16 Units into the skin 2 (two) times daily.   Marland Kitchen levothyroxine (SYNTHROID) 88 MCG tablet Take 88 mcg by mouth at bedtime.   . metoprolol tartrate (LOPRESSOR) 50 MG tablet Take 1 tablet (50 mg total) by mouth 2 (two) times daily.  . Multiple Vitamin (MULTIVITAMIN WITH MINERALS) TABS tablet Take 1 tablet by mouth daily. (Patient not taking: Reported on 03/23/2019)  . olmesartan (BENICAR) 20 MG tablet Take 20 mg by mouth daily.  . ondansetron (ZOFRAN ODT) 4 MG disintegrating tablet 4mg  ODT q4 hours prn nausea/vomit (Patient not taking: Reported on 11/12/2019)  . pioglitazone (ACTOS) 30 MG tablet Take 15 mg by mouth daily.  . polyethylene glycol (MIRALAX / GLYCOLAX) packet Take 17 g by mouth daily. (Patient not taking: Reported on 11/12/2019)  . Tamsulosin HCl (FLOMAX) 0.4 MG CAPS Take 0.4 mg by mouth 2 (two) times daily.    No current facility-administered medications for this visit. (Other)  REVIEW OF SYSTEMS:    ALLERGIES Allergies  Allergen Reactions  . Metformin And Related Other (See Comments)    Gi intolerance  . Other     Other reaction(s): Other (See Comments) Renal insufficiency Other reaction(s): GI Upset (intolerance) Gi intolerance  . Mike Mack [Insulin Glargine-Lixisenatide] Other (See Comments)    Stomach cramps    PAST MEDICAL HISTORY Past Medical History:  Diagnosis Date  . Anemia 05/13/2013  . Arthritis    "right leg" (07/25/2013)  . BPH (benign prostatic hyperplasia)   . Constipation 05/13/2013  . Exertional shortness of breath    "sometimes" (07/25/2013)  . History of kidney stones   . History of stomach ulcers   .  Hypertension   . Neuropathy   . Polio   . Polio Childhood  . Small bowel obstruction ([place])   . Stroke [facility]) 2014   residual:  "left hand kind of numb" (07/25/2013)  . Type II diabetes mellitus ([place])    Past Surgical History:  Procedure Laterality Date  . BOWEL RESECTION    . CATARACT EXTRACTION W/ INTRAOCULAR LENS  IMPLANT, BILATERAL Bilateral 2000's  . CHOLECYSTECTOMY N/A 07/25/2013   Procedure: LAPAROSCOPIC CHOLECYSTECTOMY WITH INTRAOPERATIVE CHOLANGIOGRAM;  Surgeon: [name], MD;  Location: [facility];  Service: General;  Laterality: N/A;  . COLON SURGERY    . COLONOSCOPY     Hx: of  . CYSTOSCOPY WITH BIOPSY N/A 12/01/2019   Procedure: CYSTOSCOPY WITH BLADDER BIOPSY AND FULGURATION;  Surgeon: Lucas Mallow, MD;  Location: [facility];  Service: Urology;  Laterality: N/A;  . EXCISIONAL HEMORRHOIDECTOMY  2000's  . EXTRACORPOREAL SHOCK WAVE LITHOTRIPSY Right 10/03/2018   Procedure: EXTRACORPOREAL SHOCK WAVE LITHOTRIPSY (ESWL);  Surgeon: Festus Aloe, MD;  Location[facility];  Service: Urology;  Laterality: Right;  . EYE SURGERY    . INGUINAL HERNIA REPAIR Right 1970's  . IR RADIOLOGIST EVAL & MGMT  04/06/2020  . LAPAROSCOPIC CHOLECYSTECTOMY  07/25/2013   w/LOA (07/25/2013)    FAMILY HISTORY Family History  Problem Relation Age of Onset  . Heart failure Mother   . Diabetes Sister   . Diabetes Sister   . Diabetes Brother   . Hypertension Sister   . Hypertension Sister   . Hypertension Brother   . Heart attack Sister     SOCIAL HISTORY Social History   Tobacco Use  . Smoking status: Never Smoker  . Smokeless tobacco: Never Used  Vaping Use  . Vaping Use: Never used  Substance Use Topics  . Alcohol use: No  . Drug use: No         OPHTHALMIC EXAM: Base Eye Exam    Visual Acuity (ETDRS)      Right Left   Dist cc 20/80ecc +1 20/60 -2   Dist ph cc 20/70ecc -3 NI   Correction: Glasses       Tonometry (Tonopen, 1:12 PM)      Right Left   Pressure 22 22        Pupils      Pupils Dark Light Shape React APD   Right PERRL 4 3 Round Slow None   Left PERRL 4 3 Round Slow None       Visual Fields (Counting fingers)   Poor cooperation and understanding       Extraocular Movement      Right Left    Full Full       Neuro/Psych    Oriented x3: Yes   Mood/Affect: Normal  Dilation    Both eyes: 1.0% Mydriacyl, 2.5% Phenylephrine @ 1:20 PM        Slit Lamp and Fundus Exam    External Exam      Right Left   External Normal Normal       Slit Lamp Exam      Right Left   Lids/Lashes Normal Normal   Conjunctiva/Sclera White and quiet White and quiet   Cornea Clear Clear   Anterior Chamber Deep and quiet Deep and quiet   Iris Round and reactive Round and reactive   Lens Posterior chamber intraocular lens Posterior chamber intraocular lens   Anterior Vitreous Normal Normal       Fundus Exam      Right Left   Posterior Vitreous Posterior vitreous detachment Posterior vitreous detachment   Disc Normal Normal   C/D Ratio 0.3 0.3   Macula no clinically significant macular edema no clinically significant macular edema   Vessels PDR-quiet PDR-quiet   Periphery good prp, nasal prp good prp, nasal prp          IMAGING AND PROCEDURES  Imaging and Procedures for 09/09/20  OCT, Retina - OU - Both Eyes       Right Eye Quality was good. Scan locations included subfoveal. Central Foveal Thickness: 216. Progression has been stable. Findings include central retinal atrophy, outer retinal atrophy.   Left Eye Quality was good. Scan locations included subfoveal. Central Foveal Thickness: 237. Progression has been stable. Findings include central retinal atrophy, outer retinal atrophy.   Notes Overall very stable with no active maculopathy by OCT measures       Color Fundus Photography Optos - OU - Both Eyes       Right Eye Progression has been stable. Disc findings include normal observations. Macula : microaneurysms.   Left  Eye Progression has been stable. Disc findings include normal observations. Macula : microaneurysms.   Notes History of PDR, with good PRP nasally room for more treatment temporally OD and OS  With clear media                ASSESSMENT/PLAN:  Controlled type 2 diabetes mellitus with stable proliferative retinopathy of both eyes, with long-term current use of insulin ([place]) The nature of regressed proliferative diabetic retinopathy was discussed with the patient. The patient was advised to maintain good glucose, blood pressure, monitor kidney function and serum lipid control as advised by personal physician. Rare risk for reactivation of progression exist with untreated severe anemia, untreated renal failure, untreated heart failure, and smoking. Complete avoidance of smoking was recommended. The chance of recurrent proliferative diabetic retinopathy was discussed as well as the chance of vitreous hemorrhage for which further treatments may be necessary.   Explained to the patient that the quiescent  proliferative diabetic retinopathy disease is unlikely to ever worsen.  Worsening factors would include however severe anemia, hypertension out-of-control or impending renal failure.  The nature of regressed diabetic macular edema was discussed with the patient as well as the possibility of recurrence. The importance of the tight glucose, blood pressure, and serum lipid control were emphasized. The importance of compliance with follow up was emphasized. The patient was advised to avoid smoking.  Currently the swelling in macula is controlled, and improved from prior visits.      ICD-10-CM   1. Controlled type 2 diabetes mellitus with stable proliferative retinopathy of both eyes, with long-term current use of insulin (HCC)  A41.6606 OCT, Retina - OU - Both  Eyes   Z79.4 Color Fundus Photography Optos - OU - Both Eyes    1.  Overall no progression of diabetic retinopathy, stable OU we will  continue to observe  2.  May 1-Day need completion of PRP temporal portions of the retina OU as ambulatory ability could subside 1 day  3.  Ophthalmic Meds Ordered this visit:  No orders of the defined types were placed in this encounter.      Return in about 7 months (around 04/11/2021) for DILATE OU, COLOR FP.  There are no Patient Instructions on file for this visit.   Explained the diagnoses, plan, and follow up with the patient and they expressed understanding.  Patient expressed understanding of the importance of proper follow up care.   Clent Demark Barnes Florek M.D. Diseases & Surgery of the Retina and Vitreous Retina [facility] Diabetic [place] 09/09/20     Abbreviations: M myopia (nearsighted); A astigmatism; H hyperopia (farsighted); P presbyopia; Mrx spectacle prescription;  CTL contact lenses; OD right eye; OS left eye; OU both eyes  XT exotropia; ET esotropia; PEK punctate epithelial keratitis; PEE punctate epithelial erosions; DES dry eye syndrome; MGD meibomian gland dysfunction; ATs artificial tears; PFAT's preservative free artificial tears; [place] nuclear sclerotic cataract; PSC posterior subcapsular cataract; ERM epi-retinal membrane; PVD posterior vitreous detachment; RD retinal detachment; DM diabetes mellitus; DR diabetic retinopathy; NPDR non-proliferative diabetic retinopathy; PDR proliferative diabetic retinopathy; CSME clinically significant macular edema; DME diabetic macular edema; dbh dot blot hemorrhages; CWS cotton wool spot; POAG primary open angle glaucoma; C/D cup-to-disc ratio; HVF humphrey visual field; GVF goldmann visual field; OCT optical coherence tomography; IOP intraocular pressure; BRVO Branch retinal vein occlusion; CRVO central retinal vein occlusion; CRAO central retinal artery occlusion; BRAO branch retinal artery occlusion; RT retinal tear; SB scleral buckle; PPV pars plana vitrectomy; VH Vitreous hemorrhage; PRP panretinal laser photocoagulation; IVK  intravitreal kenalog; VMT vitreomacular traction; MH Macular hole;  NVD neovascularization of the disc; NVE neovascularization elsewhere; AREDS age related eye disease study; ARMD age related macular degeneration; POAG primary open angle glaucoma; EBMD epithelial/anterior basement membrane dystrophy; ACIOL anterior chamber intraocular lens; IOL intraocular lens; PCIOL posterior chamber intraocular lens; Phaco/IOL phacoemulsification with intraocular lens placement; [place] photorefractive keratectomy; LASIK laser assisted in situ keratomileusis; HTN hypertension; DM diabetes mellitus; COPD chronic obstructive pulmonary disease

## 2020-10-12 ENCOUNTER — Encounter (INDEPENDENT_AMBULATORY_CARE_PROVIDER_SITE_OTHER): Payer: Self-pay

## 2020-10-12 DIAGNOSIS — H401133 Primary open-angle glaucoma, bilateral, severe stage: Secondary | ICD-10-CM | POA: Diagnosis not present

## 2020-10-12 DIAGNOSIS — H04123 Dry eye syndrome of bilateral lacrimal glands: Secondary | ICD-10-CM | POA: Diagnosis not present

## 2020-10-22 DIAGNOSIS — G14 Postpolio syndrome: Secondary | ICD-10-CM | POA: Diagnosis not present

## 2020-10-22 DIAGNOSIS — Z8673 Personal history of transient ischemic attack (TIA), and cerebral infarction without residual deficits: Secondary | ICD-10-CM | POA: Diagnosis not present

## 2020-10-22 DIAGNOSIS — G43909 Migraine, unspecified, not intractable, without status migrainosus: Secondary | ICD-10-CM | POA: Diagnosis not present

## 2020-11-01 DIAGNOSIS — I10 Essential (primary) hypertension: Secondary | ICD-10-CM | POA: Diagnosis not present

## 2020-11-01 DIAGNOSIS — E118 Type 2 diabetes mellitus with unspecified complications: Secondary | ICD-10-CM | POA: Diagnosis not present

## 2020-11-01 DIAGNOSIS — E039 Hypothyroidism, unspecified: Secondary | ICD-10-CM | POA: Diagnosis not present

## 2020-11-08 DIAGNOSIS — I739 Peripheral vascular disease, unspecified: Secondary | ICD-10-CM | POA: Diagnosis not present

## 2020-11-08 DIAGNOSIS — N1831 Chronic kidney disease, stage 3a: Secondary | ICD-10-CM | POA: Diagnosis not present

## 2020-11-08 DIAGNOSIS — I69998 Other sequelae following unspecified cerebrovascular disease: Secondary | ICD-10-CM | POA: Diagnosis not present

## 2020-11-08 DIAGNOSIS — F0280 Dementia in other diseases classified elsewhere without behavioral disturbance: Secondary | ICD-10-CM | POA: Diagnosis not present

## 2020-11-08 DIAGNOSIS — M353 Polymyalgia rheumatica: Secondary | ICD-10-CM | POA: Diagnosis not present

## 2020-11-08 DIAGNOSIS — Z794 Long term (current) use of insulin: Secondary | ICD-10-CM | POA: Diagnosis not present

## 2020-11-08 DIAGNOSIS — G301 Alzheimer's disease with late onset: Secondary | ICD-10-CM | POA: Diagnosis not present

## 2020-11-08 DIAGNOSIS — E1169 Type 2 diabetes mellitus with other specified complication: Secondary | ICD-10-CM | POA: Diagnosis not present

## 2020-11-08 DIAGNOSIS — E1121 Type 2 diabetes mellitus with diabetic nephropathy: Secondary | ICD-10-CM | POA: Diagnosis not present

## 2020-11-08 DIAGNOSIS — Z0000 Encounter for general adult medical examination without abnormal findings: Secondary | ICD-10-CM | POA: Diagnosis not present

## 2020-11-16 DIAGNOSIS — M79674 Pain in right toe(s): Secondary | ICD-10-CM | POA: Diagnosis not present

## 2020-11-16 DIAGNOSIS — E1151 Type 2 diabetes mellitus with diabetic peripheral angiopathy without gangrene: Secondary | ICD-10-CM | POA: Diagnosis not present

## 2020-11-16 DIAGNOSIS — L603 Nail dystrophy: Secondary | ICD-10-CM | POA: Diagnosis not present

## 2020-11-16 DIAGNOSIS — L84 Corns and callosities: Secondary | ICD-10-CM | POA: Diagnosis not present

## 2020-11-24 ENCOUNTER — Encounter (HOSPITAL_COMMUNITY): Payer: Self-pay

## 2020-11-24 ENCOUNTER — Inpatient Hospital Stay (HOSPITAL_COMMUNITY)
Admission: EM | Admit: 2020-11-24 | Discharge: 2020-12-09 | DRG: 682 | Disposition: A | Discharge status: Skilled Nursing Facility | Payer: Medicare HMO | Attending: Internal Medicine | Admitting: Internal Medicine

## 2020-11-24 ENCOUNTER — Emergency Department (HOSPITAL_COMMUNITY): Payer: Medicare HMO

## 2020-11-24 ENCOUNTER — Other Ambulatory Visit: Payer: Self-pay

## 2020-11-24 DIAGNOSIS — Z794 Long term (current) use of insulin: Secondary | ICD-10-CM | POA: Diagnosis not present

## 2020-11-24 DIAGNOSIS — I959 Hypotension, unspecified: Secondary | ICD-10-CM | POA: Diagnosis not present

## 2020-11-24 DIAGNOSIS — Z7982 Long term (current) use of aspirin: Secondary | ICD-10-CM

## 2020-11-24 DIAGNOSIS — N1831 Chronic kidney disease, stage 3a: Secondary | ICD-10-CM | POA: Diagnosis present

## 2020-11-24 DIAGNOSIS — G8310 Monoplegia of lower limb affecting unspecified side: Secondary | ICD-10-CM

## 2020-11-24 DIAGNOSIS — F05 Delirium due to known physiological condition: Secondary | ICD-10-CM | POA: Diagnosis present

## 2020-11-24 DIAGNOSIS — D638 Anemia in other chronic diseases classified elsewhere: Secondary | ICD-10-CM | POA: Diagnosis not present

## 2020-11-24 DIAGNOSIS — R4182 Altered mental status, unspecified: Secondary | ICD-10-CM | POA: Diagnosis not present

## 2020-11-24 DIAGNOSIS — Z20822 Contact with and (suspected) exposure to covid-19: Secondary | ICD-10-CM | POA: Diagnosis present

## 2020-11-24 DIAGNOSIS — D631 Anemia in chronic kidney disease: Secondary | ICD-10-CM | POA: Diagnosis present

## 2020-11-24 DIAGNOSIS — Z9049 Acquired absence of other specified parts of digestive tract: Secondary | ICD-10-CM | POA: Diagnosis not present

## 2020-11-24 DIAGNOSIS — E11649 Type 2 diabetes mellitus with hypoglycemia without coma: Secondary | ICD-10-CM | POA: Diagnosis not present

## 2020-11-24 DIAGNOSIS — R5381 Other malaise: Secondary | ICD-10-CM | POA: Diagnosis not present

## 2020-11-24 DIAGNOSIS — B91 Sequelae of poliomyelitis: Secondary | ICD-10-CM

## 2020-11-24 DIAGNOSIS — Z833 Family history of diabetes mellitus: Secondary | ICD-10-CM

## 2020-11-24 DIAGNOSIS — N400 Enlarged prostate without lower urinary tract symptoms: Secondary | ICD-10-CM | POA: Diagnosis present

## 2020-11-24 DIAGNOSIS — Q909 Down syndrome, unspecified: Secondary | ICD-10-CM

## 2020-11-24 DIAGNOSIS — M79604 Pain in right leg: Secondary | ICD-10-CM | POA: Diagnosis not present

## 2020-11-24 DIAGNOSIS — N390 Urinary tract infection, site not specified: Secondary | ICD-10-CM | POA: Diagnosis not present

## 2020-11-24 DIAGNOSIS — R4189 Other symptoms and signs involving cognitive functions and awareness: Secondary | ICD-10-CM | POA: Diagnosis present

## 2020-11-24 DIAGNOSIS — K2970 Gastritis, unspecified, without bleeding: Secondary | ICD-10-CM | POA: Diagnosis not present

## 2020-11-24 DIAGNOSIS — N179 Acute kidney failure, unspecified: Secondary | ICD-10-CM | POA: Diagnosis present

## 2020-11-24 DIAGNOSIS — M89662 Osteopathy after poliomyelitis, left lower leg: Secondary | ICD-10-CM | POA: Diagnosis not present

## 2020-11-24 DIAGNOSIS — R7989 Other specified abnormal findings of blood chemistry: Secondary | ICD-10-CM

## 2020-11-24 DIAGNOSIS — R509 Fever, unspecified: Secondary | ICD-10-CM | POA: Diagnosis not present

## 2020-11-24 DIAGNOSIS — K274 Chronic or unspecified peptic ulcer, site unspecified, with hemorrhage: Secondary | ICD-10-CM | POA: Diagnosis not present

## 2020-11-24 DIAGNOSIS — K3189 Other diseases of stomach and duodenum: Secondary | ICD-10-CM | POA: Diagnosis not present

## 2020-11-24 DIAGNOSIS — Z6831 Body mass index (BMI) 31.0-31.9, adult: Secondary | ICD-10-CM | POA: Diagnosis not present

## 2020-11-24 DIAGNOSIS — I10 Essential (primary) hypertension: Secondary | ICD-10-CM | POA: Diagnosis not present

## 2020-11-24 DIAGNOSIS — B9689 Other specified bacterial agents as the cause of diseases classified elsewhere: Secondary | ICD-10-CM | POA: Diagnosis not present

## 2020-11-24 DIAGNOSIS — E1122 Type 2 diabetes mellitus with diabetic chronic kidney disease: Secondary | ICD-10-CM | POA: Diagnosis present

## 2020-11-24 DIAGNOSIS — Z8612 Personal history of poliomyelitis: Secondary | ICD-10-CM | POA: Diagnosis not present

## 2020-11-24 DIAGNOSIS — E113553 Type 2 diabetes mellitus with stable proliferative diabetic retinopathy, bilateral: Secondary | ICD-10-CM | POA: Diagnosis present

## 2020-11-24 DIAGNOSIS — Z79899 Other long term (current) drug therapy: Secondary | ICD-10-CM

## 2020-11-24 DIAGNOSIS — G9341 Metabolic encephalopathy: Secondary | ICD-10-CM | POA: Diagnosis present

## 2020-11-24 DIAGNOSIS — K921 Melena: Secondary | ICD-10-CM | POA: Diagnosis not present

## 2020-11-24 DIAGNOSIS — F0390 Unspecified dementia without behavioral disturbance: Secondary | ICD-10-CM | POA: Diagnosis present

## 2020-11-24 DIAGNOSIS — K2980 Duodenitis without bleeding: Secondary | ICD-10-CM | POA: Diagnosis not present

## 2020-11-24 DIAGNOSIS — E669 Obesity, unspecified: Secondary | ICD-10-CM | POA: Diagnosis present

## 2020-11-24 DIAGNOSIS — K2950 Unspecified chronic gastritis without bleeding: Secondary | ICD-10-CM | POA: Diagnosis not present

## 2020-11-24 DIAGNOSIS — K5900 Constipation, unspecified: Secondary | ICD-10-CM | POA: Diagnosis not present

## 2020-11-24 DIAGNOSIS — E039 Hypothyroidism, unspecified: Secondary | ICD-10-CM | POA: Diagnosis present

## 2020-11-24 DIAGNOSIS — I129 Hypertensive chronic kidney disease with stage 1 through stage 4 chronic kidney disease, or unspecified chronic kidney disease: Secondary | ICD-10-CM | POA: Diagnosis present

## 2020-11-24 DIAGNOSIS — R531 Weakness: Secondary | ICD-10-CM | POA: Diagnosis present

## 2020-11-24 DIAGNOSIS — I69351 Hemiplegia and hemiparesis following cerebral infarction affecting right dominant side: Secondary | ICD-10-CM

## 2020-11-24 DIAGNOSIS — E119 Type 2 diabetes mellitus without complications: Secondary | ICD-10-CM

## 2020-11-24 DIAGNOSIS — D62 Acute posthemorrhagic anemia: Secondary | ICD-10-CM | POA: Diagnosis not present

## 2020-11-24 DIAGNOSIS — K254 Chronic or unspecified gastric ulcer with hemorrhage: Secondary | ICD-10-CM | POA: Diagnosis not present

## 2020-11-24 DIAGNOSIS — Z8249 Family history of ischemic heart disease and other diseases of the circulatory system: Secondary | ICD-10-CM

## 2020-11-24 DIAGNOSIS — N281 Cyst of kidney, acquired: Secondary | ICD-10-CM | POA: Diagnosis not present

## 2020-11-24 DIAGNOSIS — G8314 Monoplegia of lower limb affecting left nondominant side: Secondary | ICD-10-CM | POA: Diagnosis not present

## 2020-11-24 DIAGNOSIS — G319 Degenerative disease of nervous system, unspecified: Secondary | ICD-10-CM | POA: Diagnosis not present

## 2020-11-24 DIAGNOSIS — D649 Anemia, unspecified: Secondary | ICD-10-CM | POA: Diagnosis not present

## 2020-11-24 DIAGNOSIS — Z888 Allergy status to other drugs, medicaments and biological substances status: Secondary | ICD-10-CM

## 2020-11-24 DIAGNOSIS — E1165 Type 2 diabetes mellitus with hyperglycemia: Secondary | ICD-10-CM | POA: Diagnosis present

## 2020-11-24 DIAGNOSIS — K264 Chronic or unspecified duodenal ulcer with hemorrhage: Secondary | ICD-10-CM | POA: Diagnosis not present

## 2020-11-24 DIAGNOSIS — E785 Hyperlipidemia, unspecified: Secondary | ICD-10-CM | POA: Diagnosis present

## 2020-11-24 DIAGNOSIS — K259 Gastric ulcer, unspecified as acute or chronic, without hemorrhage or perforation: Secondary | ICD-10-CM | POA: Diagnosis not present

## 2020-11-24 DIAGNOSIS — M1909 Primary osteoarthritis, other specified site: Secondary | ICD-10-CM | POA: Diagnosis present

## 2020-11-24 DIAGNOSIS — I7091 Generalized atherosclerosis: Secondary | ICD-10-CM | POA: Diagnosis not present

## 2020-11-24 DIAGNOSIS — Z79890 Hormone replacement therapy (postmenopausal): Secondary | ICD-10-CM

## 2020-11-24 DIAGNOSIS — K269 Duodenal ulcer, unspecified as acute or chronic, without hemorrhage or perforation: Secondary | ICD-10-CM | POA: Diagnosis not present

## 2020-11-24 DIAGNOSIS — K5909 Other constipation: Secondary | ICD-10-CM | POA: Diagnosis present

## 2020-11-24 DIAGNOSIS — J9811 Atelectasis: Secondary | ICD-10-CM | POA: Diagnosis not present

## 2020-11-24 DIAGNOSIS — Z7401 Bed confinement status: Secondary | ICD-10-CM | POA: Diagnosis not present

## 2020-11-24 DIAGNOSIS — R627 Adult failure to thrive: Secondary | ICD-10-CM | POA: Diagnosis not present

## 2020-11-24 DIAGNOSIS — J90 Pleural effusion, not elsewhere classified: Secondary | ICD-10-CM | POA: Diagnosis not present

## 2020-11-24 LAB — CBG MONITORING, ED: Glucose-Capillary: 238 mg/dL — ABNORMAL HIGH (ref 70–99)

## 2020-11-24 LAB — URINALYSIS, ROUTINE W REFLEX MICROSCOPIC
Bilirubin Urine: NEGATIVE
Glucose, UA: NEGATIVE mg/dL
Ketones, ur: NEGATIVE mg/dL
Nitrite: NEGATIVE
Protein, ur: NEGATIVE mg/dL
Specific Gravity, Urine: 1.018 (ref 1.005–1.030)
pH: 5 (ref 5.0–8.0)

## 2020-11-24 LAB — CBC WITH DIFFERENTIAL/PLATELET
Abs Immature Granulocytes: 0.02 10*3/uL (ref 0.00–0.07)
Basophils Absolute: 0 10*3/uL (ref 0.0–0.1)
Basophils Relative: 0 %
Eosinophils Absolute: 0.1 10*3/uL (ref 0.0–0.5)
Eosinophils Relative: 1 %
HCT: 29 % — ABNORMAL LOW (ref 39.0–52.0)
Hemoglobin: 9.1 g/dL — ABNORMAL LOW (ref 13.0–17.0)
Immature Granulocytes: 0 %
Lymphocytes Relative: 24 %
Lymphs Abs: 1.7 10*3/uL (ref 0.7–4.0)
MCH: 31.3 pg (ref 26.0–34.0)
MCHC: 31.4 g/dL (ref 30.0–36.0)
MCV: 99.7 fL (ref 80.0–100.0)
Monocytes Absolute: 0.5 10*3/uL (ref 0.1–1.0)
Monocytes Relative: 6 %
Neutro Abs: 5 10*3/uL (ref 1.7–7.7)
Neutrophils Relative %: 69 %
Platelets: 225 10*3/uL (ref 150–400)
RBC: 2.91 MIL/uL — ABNORMAL LOW (ref 4.22–5.81)
RDW: 13.9 % (ref 11.5–15.5)
WBC: 7.2 10*3/uL (ref 4.0–10.5)
nRBC: 0 % (ref 0.0–0.2)

## 2020-11-24 LAB — COMPREHENSIVE METABOLIC PANEL
ALT: 19 U/L (ref 0–44)
AST: 18 U/L (ref 15–41)
Albumin: 2.9 g/dL — ABNORMAL LOW (ref 3.5–5.0)
Alkaline Phosphatase: 109 U/L (ref 38–126)
Anion gap: 7 (ref 5–15)
BUN: 82 mg/dL — ABNORMAL HIGH (ref 8–23)
CO2: 21 mmol/L — ABNORMAL LOW (ref 22–32)
Calcium: 8.5 mg/dL — ABNORMAL LOW (ref 8.9–10.3)
Chloride: 109 mmol/L (ref 98–111)
Creatinine, Ser: 2.55 mg/dL — ABNORMAL HIGH (ref 0.61–1.24)
GFR, Estimated: 24 mL/min — ABNORMAL LOW (ref >60)
Glucose, Bld: 247 mg/dL — ABNORMAL HIGH (ref 70–99)
Potassium: 4.8 mmol/L (ref 3.5–5.1)
Sodium: 137 mmol/L (ref 135–145)
Total Bilirubin: 0.1 mg/dL — ABNORMAL LOW (ref 0.3–1.2)
Total Protein: 6.9 g/dL (ref 6.5–8.1)

## 2020-11-24 LAB — LIPASE, BLOOD: Lipase: 46 U/L (ref 11–51)

## 2020-11-24 MED ORDER — MAGNESIUM CITRATE PO SOLN: 1.0000 | Freq: Once | ORAL | Status: DC

## 2020-11-24 NOTE — ED Notes (Signed)
Urine sent to lab WITH a culture.

## 2020-11-24 NOTE — ED Triage Notes (Signed)
Patient arrived by [facility] with complaint of leg weakness for several days and dysuria x 2 weeks. EMS reports that spouse states that patient has been constipated for several days as well, patient denies abd pain

## 2020-11-24 NOTE — ED Provider Notes (Signed)
Emergency Medicine Provider Triage Evaluation Note  Mike Mack , a 85 y.o. male  was evaluated in triage.  Pt complains of generalized lower extremity weakness for the past week as well as dysuria; no frequency. Wife also mentions constipation for the past several days. Pt denies abdominal pain, nausea, vomiting. He denies past surgical history to abdomen despite having midline scar; he is unsure what that is from.Hx of SBO.   Review of Systems  Positive: + dysuria, weakness, constipation Negative: - abdominal pain, nausea, vomiting  Physical Exam  BP 119/64 (BP Location: Left Arm)   Pulse (!) 58   Temp 98.5 F (36.9 C) (Oral)   Resp 18   SpO2 99%  Gen:   Awake, no distress   Resp:  Normal effort  MSK:   Moves extremities without difficulty  Other:  Decreased BS to abdomen; distended. No abdominal TTP. Strength equal to BLEs. Appears confused; no focal neuro deficits.   Medical Decision Making  Medically screening exam initiated at 11:43 AM.  Appropriate orders placed.  Axl Rodino was informed that the remainder of the evaluation will be completed by another provider, this initial triage assessment does not replace that evaluation, and the importance of remaining in the ED until their evaluation is complete.     Eustaquio Maize, PA-C 11/24/20 1145    Luna Fuse, MD 11/24/20 2236

## 2020-11-24 NOTE — ED Provider Notes (Signed)
[facility] EMERGENCY DEPARTMENT Provider Note   CSN: [id number] Arrival date & time: 11/24/20  1127     History Chief Complaint  Patient presents with  . Constipation    Mike Mack is a 85 y.o. male.  Presents chief complaint of weakness to the right lower extremity.  Family states that he has a history of polio and has a baseline weakness in the right lower extremity for years now however he is able to walk with a walker and transition with minimal assistance.  However they state that since last night he has been unable to use the right lower extremity.  He is unable to stand up he is unable to use his walker.  They deny any fall or trauma.  Patient denies any headache or chest pain.  No back pain no abdominal pain no vomiting or diarrhea.  Family states that has been having constipation for the past 3 to 4 days as well.        Past Medical History:  Diagnosis Date  . Anemia 05/13/2013  . Arthritis    "right leg" (07/25/2013)  . BPH (benign prostatic hyperplasia)   . Constipation 05/13/2013  . Exertional shortness of breath    "sometimes" (07/25/2013)  . History of kidney stones   . History of stomach ulcers   . Hypertension   . Neuropathy   . Polio   . Polio Childhood  . Small bowel obstruction ([place])   . Stroke [facility]) 2014   residual:  "left hand kind of numb" (07/25/2013)  . Type II diabetes mellitus [facility])     Patient Active Problem List   Diagnosis Date Noted  . Subphrenic abscess ([place]) 03/19/2020  . Failure to thrive (child) 03/19/2020  . Posterior vitreous detachment of both eyes 12/11/2019  . Syncope 10/25/2014  . AKI (acute kidney injury) ([place]) 10/25/2014  . H/O: CVA (cerebrovascular accident) 10/25/2014  . H/O poliomyelitis 10/25/2014  . Controlled type 2 diabetes mellitus with stable proliferative retinopathy of both eyes, with long-term current use of insulin ([place]) 10/25/2014  . Tachycardia 08/29/2013  . Cough 08/29/2013  . Monoparesis  of leg ([place]) 08/20/2013  . Muscle weakness of lower extremity 08/20/2013  . Right upper quadrant abdominal abscess ([place]) 08/15/2013  . Hepatic abscess 08/15/2013  . Subacute delirium 08/14/2013  . Hyperglycemia 08/14/2013  . Oral thrush 08/14/2013  . Leukocytosis, unspecified 08/14/2013  . Debility 08/14/2013  . S/P cholecystectomy 07/29/2013  . Cholecystitis with cholelithiasis 07/25/2013  . Arthritis 06/06/2013  . Headache(784.0) 06/06/2013  . Headache 06/06/2013  . Generalized weakness 05/24/2013  . Cholecystitis, acute 05/24/2013  . RUQ abdominal pain 05/13/2013  . Acute cholecystitis: Probable 05/13/2013  . Constipation 05/13/2013  . Enterococcus UTI 05/13/2013  . Leukocytosis 05/13/2013  . Anemia 05/13/2013  . Acute left thalamic CVA (cerebral infarction) 04/10/2012  . Hypertension   . BPH (benign prostatic hyperplasia)   . Pruritic disorder 02/02/2011    Past Surgical History:  Procedure Laterality Date  . BOWEL RESECTION    . CATARACT EXTRACTION W/ INTRAOCULAR LENS  IMPLANT, BILATERAL Bilateral 2000's  . CHOLECYSTECTOMY N/A 07/25/2013   Procedure: LAPAROSCOPIC CHOLECYSTECTOMY WITH INTRAOPERATIVE CHOLANGIOGRAM;  Surgeon: [name], MD;  Location: [facility];  Service: General;  Laterality: N/A;  . COLON SURGERY    . COLONOSCOPY     Hx: of  . CYSTOSCOPY WITH BIOPSY N/A 12/01/2019   Procedure: CYSTOSCOPY WITH BLADDER BIOPSY AND FULGURATION;  Surgeon: Lucas Mallow, MD;  Location: [facility];  Service: Urology;  Laterality: N/A;  . EXCISIONAL HEMORRHOIDECTOMY  2000's  . EXTRACORPOREAL SHOCK WAVE LITHOTRIPSY Right 10/03/2018   Procedure: EXTRACORPOREAL SHOCK WAVE LITHOTRIPSY (ESWL);  Surgeon: Festus Aloe, MD;  [facility];  Service: Urology;  Laterality: Right;  . EYE SURGERY    . INGUINAL HERNIA REPAIR Right 1970's  . IR RADIOLOGIST EVAL & MGMT  04/06/2020  . LAPAROSCOPIC CHOLECYSTECTOMY  07/25/2013   w/LOA (07/25/2013)       Family History  Problem  Relation Age of Onset  . Heart failure Mother   . Diabetes Sister   . Diabetes Sister   . Diabetes Brother   . Hypertension Sister   . Hypertension Sister   . Hypertension Brother   . Heart attack Sister     Social History   Tobacco Use  . Smoking status: Never Smoker  . Smokeless tobacco: Never Used  Vaping Use  . Vaping Use: Never used  Substance Use Topics  . Alcohol use: No  . Drug use: No    Home Medications Prior to Admission medications   Medication Sig Start Date End Date Taking? Authorizing Provider  aspirin EC 81 MG tablet Take 81 mg by mouth daily.    [provider]  atorvastatin (LIPITOR) 20 MG tablet Take 20 mg by mouth every evening.  11/03/13   [provider]  B Complex Vitamins (B COMPLEX PO) Take 1 tablet by mouth daily.    [provider]  brimonidine (ALPHAGAN) 0.2 % ophthalmic solution Place 1 drop into both eyes 2 (two) times daily. 12/28/19   [provider]  cholecalciferol (VITAMIN D-400) 10 MCG (400 UNIT) TABS tablet Take 400 Units by mouth daily.    [provider]  dorzolamide-timolol (COSOPT) 22.3-6.8 MG/ML ophthalmic solution Place 1 drop into both eyes 2 (two) times daily. 03/04/20   [provider]  feeding supplement, GLUCERNA SHAKE, (GLUCERNA SHAKE) LIQD Take 237 mLs by mouth 3 (three) times daily between meals. 03/25/20   Antonieta Pert, MD  gabapentin (NEURONTIN) 600 MG tablet Take 600 mg by mouth 2 (two) times daily. 01/20/20   [provider]  glucose blood test strip  04/04/15   [provider]  insulin detemir (LEVEMIR) 100 UNIT/ML injection Inject 16 Units into the skin 2 (two) times daily.     [provider]  latanoprost (XALATAN) 0.005 % ophthalmic solution Place 1 drop into both eyes at bedtime.  11/17/14   [provider]  levothyroxine (SYNTHROID) 88 MCG tablet Take 88 mcg by mouth at bedtime.  04/21/11   [provider]  metoprolol tartrate  (LOPRESSOR) 50 MG tablet Take 1 tablet (50 mg total) by mouth 2 (two) times daily. 03/25/20   Antonieta Pert, MD  Multiple Vitamin (MULTIVITAMIN WITH MINERALS) TABS tablet Take 1 tablet by mouth daily. Patient not taking: Reported on 03/23/2019 09/06/13   [name], MD  olmesartan (BENICAR) 20 MG tablet Take 20 mg by mouth daily. 02/20/20   [provider]  ondansetron (ZOFRAN ODT) 4 MG disintegrating tablet 4mg  ODT q4 hours prn nausea/vomit Patient not taking: Reported on 11/12/2019 08/24/19   Drenda Freeze, MD  pioglitazone (ACTOS) 30 MG tablet Take 15 mg by mouth daily. 03/04/20   [provider]  polyethylene glycol (MIRALAX / GLYCOLAX) packet Take 17 g by mouth daily. Patient not taking: Reported on 11/12/2019 09/06/13   [name], MD  RHOPRESSA 0.02 % SOLN Place 1 drop into both eyes at  bedtime. 03/05/20   [provider]  Tamsulosin HCl (FLOMAX) 0.4 MG CAPS Take 0.4 mg by mouth 2 (two) times daily.     [provider]    Allergies    Metformin and related, Other, and [place] [insulin glargine-lixisenatide]  Review of Systems   Review of Systems  Constitutional: Negative for fever.  HENT: Negative for ear pain and sore throat.   Eyes: Negative for pain.  Respiratory: Negative for cough.   Cardiovascular: Negative for chest pain.  Gastrointestinal: Negative for abdominal pain.  Genitourinary: Negative for flank pain.  Musculoskeletal: Negative for back pain.  Skin: Negative for color change and rash.  Neurological: Negative for syncope.  All other systems reviewed and are negative.   Physical Exam Updated Vital Signs BP (!) 106/46   Pulse 60   Temp 97.9 F (36.6 C) (Oral)   Resp 14   SpO2 100%   Physical Exam Constitutional:      General: He is not in acute distress.    Appearance: He is well-developed.  HENT:     Head: Normocephalic.     Nose: Nose normal.  Eyes:     Extraocular Movements: Extraocular movements intact.   Cardiovascular:     Rate and Rhythm: Normal rate.  Pulmonary:     Effort: Pulmonary effort is normal.  Genitourinary:    Comments: Normal rectal tone no saddle anesthesia noted.  No stool in the vault noted. Skin:    Coloration: Skin is not jaundiced.  Neurological:     Mental Status: He is alert.     Comments: Right lower extremity appears significantly atrophic consistent with history of polio.  He is able to move his right lower extremity but has difficult time lifting it up off the bed which the wife states is new for him.       ED Results / Procedures / Treatments   Labs (all labs ordered are listed, but only abnormal results are displayed) Labs Reviewed  COMPREHENSIVE METABOLIC PANEL - Abnormal; Notable for the following components:      Result Value   CO2 21 (*)    Glucose, Bld 247 (*)    BUN 82 (*)    Creatinine, Ser 2.55 (*)    Calcium 8.5 (*)    Albumin 2.9 (*)    Total Bilirubin 0.1 (*)    GFR, Estimated 24 (*)    All other components within normal limits  CBC WITH DIFFERENTIAL/PLATELET - Abnormal; Notable for the following components:   RBC 2.91 (*)    Hemoglobin 9.1 (*)    HCT 29.0 (*)    All other components within normal limits  URINALYSIS, ROUTINE W REFLEX MICROSCOPIC - Abnormal; Notable for the following components:   APPearance HAZY (*)    Hgb urine dipstick SMALL (*)    Leukocytes,Ua SMALL (*)    Bacteria, UA RARE (*)    All other components within normal limits  CBG MONITORING, ED - Abnormal; Notable for the following components:   Glucose-Capillary 238 (*)    All other components within normal limits  LIPASE, BLOOD    EKG None  Radiology No results found.  Procedures Procedures   Medications Ordered in ED Medications - No data to display  ED Course  I have reviewed the triage vital signs and the nursing notes.  Pertinent labs & imaging results that were available during my care of the patient were reviewed by me and considered in  my medical decision making (see chart  for details).    MDM Rules/Calculators/A&P                          I suspect this is acute exacerbation of his right lower extremity due to polio.  However wife is insistent that this is sudden onset weakness started today.  I did consider spinal etiology, however he has no back pain normal rectal tone no saddle anesthesia.  MRI of the brain pursued.   will be signed out to oncoming provider.   Final Clinical Impression(s) / ED Diagnoses Final diagnoses:  None    Rx / DC Orders ED Discharge Orders    None       Luna Fuse, MD 11/24/20 2238

## 2020-11-25 ENCOUNTER — Encounter (HOSPITAL_COMMUNITY): Payer: Self-pay | Admitting: Family Medicine

## 2020-11-25 DIAGNOSIS — N1831 Chronic kidney disease, stage 3a: Secondary | ICD-10-CM | POA: Diagnosis present

## 2020-11-25 DIAGNOSIS — R7989 Other specified abnormal findings of blood chemistry: Secondary | ICD-10-CM | POA: Diagnosis not present

## 2020-11-25 DIAGNOSIS — F0390 Unspecified dementia without behavioral disturbance: Secondary | ICD-10-CM | POA: Diagnosis present

## 2020-11-25 DIAGNOSIS — N179 Acute kidney failure, unspecified: Secondary | ICD-10-CM | POA: Diagnosis present

## 2020-11-25 DIAGNOSIS — R4189 Other symptoms and signs involving cognitive functions and awareness: Secondary | ICD-10-CM

## 2020-11-25 DIAGNOSIS — R531 Weakness: Secondary | ICD-10-CM | POA: Diagnosis present

## 2020-11-25 DIAGNOSIS — E113553 Type 2 diabetes mellitus with stable proliferative diabetic retinopathy, bilateral: Secondary | ICD-10-CM | POA: Diagnosis present

## 2020-11-25 DIAGNOSIS — E669 Obesity, unspecified: Secondary | ICD-10-CM | POA: Diagnosis present

## 2020-11-25 DIAGNOSIS — Z9049 Acquired absence of other specified parts of digestive tract: Secondary | ICD-10-CM | POA: Diagnosis not present

## 2020-11-25 DIAGNOSIS — I69351 Hemiplegia and hemiparesis following cerebral infarction affecting right dominant side: Secondary | ICD-10-CM | POA: Diagnosis not present

## 2020-11-25 DIAGNOSIS — K921 Melena: Secondary | ICD-10-CM | POA: Diagnosis not present

## 2020-11-25 DIAGNOSIS — I10 Essential (primary) hypertension: Secondary | ICD-10-CM

## 2020-11-25 DIAGNOSIS — N390 Urinary tract infection, site not specified: Secondary | ICD-10-CM | POA: Diagnosis not present

## 2020-11-25 DIAGNOSIS — D631 Anemia in chronic kidney disease: Secondary | ICD-10-CM | POA: Diagnosis present

## 2020-11-25 DIAGNOSIS — E1165 Type 2 diabetes mellitus with hyperglycemia: Secondary | ICD-10-CM | POA: Diagnosis present

## 2020-11-25 DIAGNOSIS — Z20822 Contact with and (suspected) exposure to covid-19: Secondary | ICD-10-CM | POA: Diagnosis present

## 2020-11-25 DIAGNOSIS — D649 Anemia, unspecified: Secondary | ICD-10-CM | POA: Diagnosis not present

## 2020-11-25 DIAGNOSIS — F05 Delirium due to known physiological condition: Secondary | ICD-10-CM | POA: Diagnosis present

## 2020-11-25 DIAGNOSIS — D62 Acute posthemorrhagic anemia: Secondary | ICD-10-CM | POA: Diagnosis not present

## 2020-11-25 DIAGNOSIS — Z6831 Body mass index (BMI) 31.0-31.9, adult: Secondary | ICD-10-CM | POA: Diagnosis not present

## 2020-11-25 DIAGNOSIS — G8310 Monoplegia of lower limb affecting unspecified side: Secondary | ICD-10-CM

## 2020-11-25 DIAGNOSIS — E11649 Type 2 diabetes mellitus with hypoglycemia without coma: Secondary | ICD-10-CM | POA: Diagnosis not present

## 2020-11-25 DIAGNOSIS — E1122 Type 2 diabetes mellitus with diabetic chronic kidney disease: Secondary | ICD-10-CM | POA: Diagnosis present

## 2020-11-25 DIAGNOSIS — G9341 Metabolic encephalopathy: Secondary | ICD-10-CM | POA: Diagnosis present

## 2020-11-25 DIAGNOSIS — K274 Chronic or unspecified peptic ulcer, site unspecified, with hemorrhage: Secondary | ICD-10-CM | POA: Diagnosis not present

## 2020-11-25 DIAGNOSIS — N400 Enlarged prostate without lower urinary tract symptoms: Secondary | ICD-10-CM | POA: Diagnosis present

## 2020-11-25 DIAGNOSIS — E039 Hypothyroidism, unspecified: Secondary | ICD-10-CM | POA: Diagnosis present

## 2020-11-25 DIAGNOSIS — I129 Hypertensive chronic kidney disease with stage 1 through stage 4 chronic kidney disease, or unspecified chronic kidney disease: Secondary | ICD-10-CM | POA: Diagnosis present

## 2020-11-25 DIAGNOSIS — K264 Chronic or unspecified duodenal ulcer with hemorrhage: Secondary | ICD-10-CM | POA: Diagnosis not present

## 2020-11-25 LAB — BASIC METABOLIC PANEL
Anion gap: 9 (ref 5–15)
BUN: 71 mg/dL — ABNORMAL HIGH (ref 8–23)
CO2: 21 mmol/L — ABNORMAL LOW (ref 22–32)
Calcium: 7.9 mg/dL — ABNORMAL LOW (ref 8.9–10.3)
Chloride: 109 mmol/L (ref 98–111)
Creatinine, Ser: 2.08 mg/dL — ABNORMAL HIGH (ref 0.61–1.24)
GFR, Estimated: 31 mL/min — ABNORMAL LOW (ref >60)
Glucose, Bld: 217 mg/dL — ABNORMAL HIGH (ref 70–99)
Potassium: 4.4 mmol/L (ref 3.5–5.1)
Sodium: 139 mmol/L (ref 135–145)

## 2020-11-25 LAB — GLUCOSE, CAPILLARY
Glucose-Capillary: 140 mg/dL — ABNORMAL HIGH (ref 70–99)
Glucose-Capillary: 81 mg/dL (ref 70–99)

## 2020-11-25 LAB — RESP PANEL BY RT-PCR (FLU A&B, COVID) ARPGX2
Influenza A by PCR: NEGATIVE
Influenza B by PCR: NEGATIVE
SARS Coronavirus 2 by RT PCR: NEGATIVE

## 2020-11-25 LAB — VITAMIN B12: Vitamin B-12: 464 pg/mL (ref 180–914)

## 2020-11-25 LAB — IRON AND TIBC
Iron: 44 ug/dL — ABNORMAL LOW (ref 45–182)
Saturation Ratios: 19 % (ref 17.9–39.5)
TIBC: 234 ug/dL — ABNORMAL LOW (ref 250–450)
UIBC: 190 ug/dL

## 2020-11-25 LAB — RETICULOCYTES
Immature Retic Fract: 24.5 % — ABNORMAL HIGH (ref 2.3–15.9)
RBC.: 2.62 MIL/uL — ABNORMAL LOW (ref 4.22–5.81)
Retic Count, Absolute: 36.2 10*3/uL (ref 19.0–186.0)
Retic Ct Pct: 1.4 % (ref 0.4–3.1)

## 2020-11-25 LAB — CBC
HCT: 25.6 % — ABNORMAL LOW (ref 39.0–52.0)
Hemoglobin: 8.3 g/dL — ABNORMAL LOW (ref 13.0–17.0)
MCH: 31.9 pg (ref 26.0–34.0)
MCHC: 32.4 g/dL (ref 30.0–36.0)
MCV: 98.5 fL (ref 80.0–100.0)
Platelets: 230 10*3/uL (ref 150–400)
RBC: 2.6 MIL/uL — ABNORMAL LOW (ref 4.22–5.81)
RDW: 13.9 % (ref 11.5–15.5)
WBC: 7.1 10*3/uL (ref 4.0–10.5)
nRBC: 0 % (ref 0.0–0.2)

## 2020-11-25 LAB — FERRITIN: Ferritin: 65 ng/mL (ref 24–336)

## 2020-11-25 LAB — FOLATE: Folate: 35.5 ng/mL (ref >5.9)

## 2020-11-25 MED ORDER — DORZOLAMIDE HCL-TIMOLOL MAL 2-0.5 % OP SOLN
1.0000 [drp] | Freq: Two times a day (BID) | OPHTHALMIC | Status: DC
  Administered 2020-11-25 – 2020-12-09 (×30): 1 [drp] via OPHTHALMIC
  Filled 2020-11-25 (×2): qty 10

## 2020-11-25 MED ORDER — SODIUM CHLORIDE 0.9 % IV BOLUS (SEPSIS)
1000.0000 mL | Freq: Once | INTRAVENOUS | Status: AC
  Administered 2020-11-25: 1000 mL via INTRAVENOUS

## 2020-11-25 MED ORDER — INSULIN ASPART 100 UNIT/ML IJ SOLN
0.0000 [IU] | Freq: Three times a day (TID) | INTRAMUSCULAR | Status: DC
  Administered 2020-11-25: 1 [IU] via SUBCUTANEOUS
  Administered 2020-11-26: 2 [IU] via SUBCUTANEOUS
  Administered 2020-11-27 (×2): 3 [IU] via SUBCUTANEOUS
  Administered 2020-11-28: 2 [IU] via SUBCUTANEOUS
  Administered 2020-11-28: 1 [IU] via SUBCUTANEOUS
  Administered 2020-11-29 (×2): 2 [IU] via SUBCUTANEOUS
  Administered 2020-11-30 – 2020-12-01 (×2): 1 [IU] via SUBCUTANEOUS
  Administered 2020-12-01: 2 [IU] via SUBCUTANEOUS
  Administered 2020-12-01: 3 [IU] via SUBCUTANEOUS
  Administered 2020-12-02 (×2): 1 [IU] via SUBCUTANEOUS
  Administered 2020-12-02 – 2020-12-03 (×3): 2 [IU] via SUBCUTANEOUS
  Administered 2020-12-03 – 2020-12-04 (×2): 1 [IU] via SUBCUTANEOUS
  Administered 2020-12-04: 3 [IU] via SUBCUTANEOUS
  Administered 2020-12-04: 2 [IU] via SUBCUTANEOUS
  Administered 2020-12-05: 1 [IU] via SUBCUTANEOUS
  Administered 2020-12-05: 2 [IU] via SUBCUTANEOUS
  Administered 2020-12-05: 1 [IU] via SUBCUTANEOUS
  Administered 2020-12-06: 2 [IU] via SUBCUTANEOUS
  Administered 2020-12-06: 3 [IU] via SUBCUTANEOUS
  Administered 2020-12-06: 1 [IU] via SUBCUTANEOUS
  Administered 2020-12-07 – 2020-12-08 (×4): 2 [IU] via SUBCUTANEOUS
  Administered 2020-12-08: 5 [IU] via SUBCUTANEOUS
  Administered 2020-12-09: 3 [IU] via SUBCUTANEOUS
  Administered 2020-12-09: 5 [IU] via SUBCUTANEOUS
  Administered 2020-12-09: 2 [IU] via SUBCUTANEOUS

## 2020-11-25 MED ORDER — ASPIRIN EC 81 MG PO TBEC
81.0000 mg | DELAYED_RELEASE_TABLET | Freq: Every day | ORAL | Status: DC
  Administered 2020-11-25 – 2020-11-28 (×4): 81 mg via ORAL
  Filled 2020-11-25 (×4): qty 1

## 2020-11-25 MED ORDER — METOPROLOL TARTRATE 12.5 MG HALF TABLET
12.5000 mg | ORAL_TABLET | Freq: Two times a day (BID) | ORAL | Status: DC
  Administered 2020-11-25 – 2020-11-29 (×9): 12.5 mg via ORAL
  Filled 2020-11-25 (×10): qty 1

## 2020-11-25 MED ORDER — INSULIN ASPART 100 UNIT/ML IJ SOLN
0.0000 [IU] | Freq: Every day | INTRAMUSCULAR | Status: DC
  Administered 2020-11-30: 2 [IU] via SUBCUTANEOUS
  Administered 2020-12-01: 4 [IU] via SUBCUTANEOUS
  Administered 2020-12-08: 3 [IU] via SUBCUTANEOUS
  Administered 2020-12-09: 2 [IU] via SUBCUTANEOUS

## 2020-11-25 MED ORDER — HEPARIN SODIUM (PORCINE) 5000 UNIT/ML IJ SOLN
5000.0000 [IU] | Freq: Three times a day (TID) | INTRAMUSCULAR | Status: DC
  Administered 2020-11-25 – 2020-11-29 (×13): 5000 [IU] via SUBCUTANEOUS
  Filled 2020-11-25 (×13): qty 1

## 2020-11-25 MED ORDER — GABAPENTIN 300 MG PO CAPS
600.0000 mg | ORAL_CAPSULE | Freq: Two times a day (BID) | ORAL | Status: DC
  Administered 2020-11-25 – 2020-12-08 (×28): 600 mg via ORAL
  Filled 2020-11-25 (×13): qty 2
  Filled 2020-11-25: qty 6
  Filled 2020-11-25 (×15): qty 2

## 2020-11-25 MED ORDER — LATANOPROST 0.005 % OP SOLN
1.0000 [drp] | Freq: Every day | OPHTHALMIC | Status: DC
  Administered 2020-11-25 – 2020-12-09 (×15): 1 [drp] via OPHTHALMIC
  Filled 2020-11-25 (×2): qty 2.5

## 2020-11-25 MED ORDER — ACETAMINOPHEN 650 MG RE SUPP: 650.0000 mg | Freq: Four times a day (QID) | RECTAL | Status: DC | PRN

## 2020-11-25 MED ORDER — METOPROLOL TARTRATE 50 MG PO TABS
50.0000 mg | ORAL_TABLET | Freq: Two times a day (BID) | ORAL | Status: DC
  Administered 2020-11-25: 50 mg via ORAL
  Filled 2020-11-25: qty 2

## 2020-11-25 MED ORDER — TAMSULOSIN HCL 0.4 MG PO CAPS
0.4000 mg | ORAL_CAPSULE | Freq: Two times a day (BID) | ORAL | Status: DC
  Administered 2020-11-25 – 2020-12-09 (×31): 0.4 mg via ORAL
  Filled 2020-11-25 (×31): qty 1

## 2020-11-25 MED ORDER — ATORVASTATIN CALCIUM 10 MG PO TABS
20.0000 mg | ORAL_TABLET | Freq: Every evening | ORAL | Status: DC
  Administered 2020-11-25 – 2020-12-09 (×15): 20 mg via ORAL
  Filled 2020-11-25 (×15): qty 2

## 2020-11-25 MED ORDER — ONDANSETRON HCL 4 MG/2ML IJ SOLN
4.0000 mg | Freq: Four times a day (QID) | INTRAMUSCULAR | Status: DC | PRN
Start: 2020-11-25 — End: 2020-12-10

## 2020-11-25 MED ORDER — ACETAMINOPHEN 325 MG PO TABS
650.0000 mg | ORAL_TABLET | Freq: Four times a day (QID) | ORAL | Status: DC | PRN
  Administered 2020-11-30 – 2020-12-07 (×8): 650 mg via ORAL
  Filled 2020-11-25 (×9): qty 2

## 2020-11-25 MED ORDER — PIOGLITAZONE HCL 15 MG PO TABS
15.0000 mg | ORAL_TABLET | Freq: Every day | ORAL | Status: DC
  Administered 2020-11-25 – 2020-11-26 (×2): 15 mg via ORAL
  Filled 2020-11-25 (×4): qty 1

## 2020-11-25 MED ORDER — BRIMONIDINE TARTRATE 0.2 % OP SOLN
1.0000 [drp] | Freq: Two times a day (BID) | OPHTHALMIC | Status: DC
  Administered 2020-11-25 – 2020-12-09 (×30): 1 [drp] via OPHTHALMIC
  Filled 2020-11-25 (×2): qty 5

## 2020-11-25 MED ORDER — SODIUM CHLORIDE 0.9 % IV SOLN
1000.0000 mL | INTRAVENOUS | Status: DC
  Administered 2020-11-25: 1000 mL via INTRAVENOUS

## 2020-11-25 MED ORDER — ONDANSETRON HCL 4 MG PO TABS: 4.0000 mg | ORAL_TABLET | Freq: Four times a day (QID) | ORAL | Status: DC | PRN

## 2020-11-25 MED ORDER — INSULIN DETEMIR 100 UNIT/ML ~~LOC~~ SOLN
16.0000 [IU] | Freq: Two times a day (BID) | SUBCUTANEOUS | Status: DC
  Administered 2020-11-25 – 2020-11-26 (×4): 16 [IU] via SUBCUTANEOUS
  Filled 2020-11-25 (×7): qty 0.16

## 2020-11-25 MED ORDER — ADULT MULTIVITAMIN W/MINERALS CH
1.0000 | ORAL_TABLET | Freq: Every day | ORAL | Status: DC
  Administered 2020-11-25 – 2020-12-09 (×15): 1 via ORAL
  Filled 2020-11-25 (×15): qty 1

## 2020-11-25 MED ORDER — LEVOTHYROXINE SODIUM 88 MCG PO TABS
88.0000 ug | ORAL_TABLET | Freq: Every day | ORAL | Status: DC
  Administered 2020-11-25 – 2020-12-09 (×15): 88 ug via ORAL
  Filled 2020-11-25 (×15): qty 1

## 2020-11-25 MED ORDER — NETARSUDIL DIMESYLATE 0.02 % OP SOLN
1.0000 [drp] | Freq: Every day | OPHTHALMIC | Status: DC
  Administered 2020-11-27 – 2020-12-09 (×10): 1 [drp] via OPHTHALMIC
  Filled 2020-11-25 (×3): qty 1

## 2020-11-25 MED ORDER — MEMANTINE HCL 10 MG PO TABS
10.0000 mg | ORAL_TABLET | Freq: Two times a day (BID) | ORAL | Status: DC
  Administered 2020-11-25 – 2020-12-09 (×30): 10 mg via ORAL
  Filled 2020-11-25 (×31): qty 1

## 2020-11-25 MED ORDER — LACTATED RINGERS IV SOLN: INTRAVENOUS | Status: DC

## 2020-11-25 MED ORDER — INSULIN ASPART 100 UNIT/ML IJ SOLN
3.0000 [IU] | Freq: Three times a day (TID) | INTRAMUSCULAR | Status: DC
  Administered 2020-11-25 – 2020-11-27 (×3): 3 [IU] via SUBCUTANEOUS

## 2020-11-25 NOTE — Evaluation (Signed)
Physical Therapy Evaluation Patient Details Name: Mike Mack MRN: [id number] DOB: 1935-11-14 Today's Date: 11/25/2020   History of Present Illness  Pt presented to ED 6/8 with weakness and inability to ambulate. Pt found to have AKI. PMH - polio with chronic weakness of RLE, DM, HTN, anemia, CVA  Clinical Impression  Pt presents to PT with significant weakness leading to poor mobility and inability to ambulate. Pt noted to have asterixis in standing. Pt lives with elderly wife. Pt agreeable to SNF prior to return home.     Follow Up Recommendations SNF    Equipment Recommendations  None recommended by PT    Recommendations for Other Services       Precautions / Restrictions        Mobility  Bed Mobility               General bed mobility comments: Pt up in chair    Transfers Overall transfer level: Needs assistance Equipment used: Rolling walker (2 wheeled) Transfers: Sit to/from Stand Sit to Stand: +2 physical assistance;Mod assist         General transfer comment: Assist to bring hips up and for balance. Performed x 3  Ambulation/Gait             General Gait Details: Unable to due to weakness/asterixis  Stairs            Wheelchair Mobility    Modified Rankin (Stroke Patients Only)       Balance Overall balance assessment: Needs assistance Sitting-balance support: Bilateral upper extremity supported;Feet supported Sitting balance-Leahy Scale: Poor Sitting balance - Comments: UE support   Standing balance support: Bilateral upper extremity supported Standing balance-Leahy Scale: Poor Standing balance comment: walker and  min assist for static standing                             Pertinent Vitals/Pain Pain Assessment: No/denies pain    Home Living Family/patient expects to be discharged to:: Private residence Living Arrangements: Spouse/significant other Available Help at Discharge: Family;Available 24  hours/day Type of Home: House Home Access: Stairs to enter Entrance Stairs-Rails: Right;Left;Can reach both Entrance Stairs-Number of Steps: 3-4 Home Layout: One level Home Equipment: Walker - 2 wheels;Cane - single point;Wheelchair - manual;Grab bars - tub/shower;Grab bars - toilet;Shower seat;Hand held shower head      Prior Function Level of Independence: Needs assistance   Gait / Transfers Assistance Needed: amb short distance modified independence with walker.           Hand Dominance   Dominant Hand: Right    Extremity/Trunk Assessment   Upper Extremity Assessment Upper Extremity Assessment: Defer to OT evaluation    Lower Extremity Assessment Lower Extremity Assessment: RLE deficits/detail RLE Deficits / Details: baseline weakness due to polio. Strenght <3/5       Communication   Communication: No difficulties  Cognition Arousal/Alertness: Awake/alert Behavior During Therapy: WFL for tasks assessed/performed Overall Cognitive Status: No family/caregiver present to determine baseline cognitive functioning                                 General Comments: Pt disoriented to time and situtaion. Decr short term memory. Follows 1 step commands      General Comments      Exercises     Assessment/Plan    PT Assessment Patient needs continued PT services  PT Problem  List Decreased strength;Decreased balance;Decreased mobility       PT Treatment Interventions DME instruction;Gait training;Functional mobility training;Therapeutic activities;Therapeutic exercise;Balance training;Patient/family education    PT Goals (Current goals can be found in the Care Plan section)  Acute Rehab PT Goals Patient Stated Goal: get strong enough to return home PT Goal Formulation: With patient Time For Goal Achievement: 12/09/20 Potential to Achieve Goals: Good    Frequency Min 3X/week   Barriers to discharge        Co-evaluation                AM-PAC PT "6 Clicks" Mobility  Outcome Measure Help needed turning from your back to your side while in a flat bed without using bedrails?: A Little Help needed moving from lying on your back to sitting on the side of a flat bed without using bedrails?: A Lot Help needed moving to and from a bed to a chair (including a wheelchair)?: Total Help needed standing up from a chair using your arms (e.g., wheelchair or bedside chair)?: Total Help needed to walk in hospital room?: Total Help needed climbing 3-5 steps with a railing? : Total 6 Click Score: 9    End of Session Equipment Utilized During Treatment: Gait belt Activity Tolerance: Patient tolerated treatment well Patient left: in chair;with call bell/phone within reach Nurse Communication: Mobility status;Need for lift equipment (recommend [facility]) PT Visit Diagnosis: Unsteadiness on feet (R26.81);Other abnormalities of gait and mobility (R26.89);Muscle weakness (generalized) (M62.81)    Time: 3845-3646 PT Time Calculation (min) (ACUTE ONLY): 21 min   Charges:   PT Evaluation $PT Eval Moderate Complexity: [place] Pager [phone] Office [place] 11/25/2020, 12:42 PM

## 2020-11-25 NOTE — Progress Notes (Signed)
PROGRESS NOTE  Mike Mack NGE:[id number] DOB: 08-03-1935   PCP: Jani Gravel, MD  Patient is from: Home.  Lives with his wife.  Uses walker at baseline.  DOA: 11/24/2020 LOS: 0  Chief complaints: Generalized weakness  Brief Narrative / Interim history: 85 year old M with PMH of DM-2, HTN, BPH, CKD-3A, CVA, cognitive decline, HTN, childhood polio with RLE atrophy and weakness and neuropathy presenting with generalized weakness for 2 days, and admitted for AKI with azotemia with Cr to 2.55 from baseline of 1.3 and BUN in 80s.  Patient was started on IV fluids    Subjective: Seen and examined earlier this morning.  No major events overnight of this morning.  No complaints.  He states he feels well. He is awake and alert.  He is oriented to self and place but not time.   Objective: Vitals:   11/25/20 0425 11/25/20 0449 11/25/20 0513 11/25/20 1028  BP:  (!) 115/54 (!) 115/54 137/64  Pulse:  65 65 66  Resp:  17 17 19   Temp: 98.4 F (36.9 C) 98.2 F (36.8 C) 98.2 F (36.8 C) 98.3 F (36.8 C)  TempSrc: Oral Oral Oral Oral  SpO2:  100%  100%  Weight:   84.5 kg   Height:   5\' 5"  (1.651 m)     Intake/Output Summary (Last 24 hours) at 11/25/2020 1302 Last data filed at 11/25/2020 1014 Gross per 24 hour  Intake 294.17 ml  Output 400 ml  Net -105.83 ml   Filed Weights   11/25/20 0513  Weight: 84.5 kg    Examination:  GENERAL: No apparent distress.  Nontoxic. HEENT: MMM.  Vision and hearing grossly intact.  NECK: Supple.  No apparent JVD.  RESP: On RA.  No IWOB.  Fair aeration bilaterally. CVS:  RRR. Heart sounds normal.  ABD/GI/GU: BS+. Abd soft, NTND.  MSK/EXT: Atrophy and weakness of RLE SKIN: no apparent skin lesion or wound NEURO: Awake, alert and oriented appropriately.  No apparent focal neuro deficit. PSYCH: Calm. Normal affect.  Procedures:  None  Microbiology summarized: Influenza PCR and COVID-19 PCR nonreactive.  Assessment & Plan: AKI on CKD-3A/azotemia:  Baseline Cr ranges from 1.1-1.4.  Likely prerenal.  He is also on Benicar which might contribute.  Improving. Recent Labs    12/07/19 2145 02/05/20 0421 03/19/20 0641 03/20/20 0536 03/22/20 0654 03/23/20 0409 03/24/20 0335 03/25/20 0336 11/24/20 1145 11/25/20 0553  BUN 19 16 12 12 21 19 15 15  82* 71*  CREATININE 1.31* 1.37* 1.35* 1.24 1.41* 1.45* 1.16 1.48* 2.55* 2.08*  -Hold home Benicar -Continue IV fluid -Recheck renal function in the morning -Further work-up if no improvement  Ambulatory dysfunction/generalized weakness: Patient uses walker at baseline. Now maximum assist with 2 person per therapy.  No new focal neuro symptoms.  Likely from the above. -Continue PT/OT-recommended SNF.  IDDM-2 with hyperglycemia and neuropathy: Last A1c 8.4% in 03/2020. Recent Labs  Lab 11/24/20 1138  GLUCAP 238*  -Continue Levemir 16 units twice daily -Add SSI-renal -Added NovoLog 3 units 3 times daily with meals -Continue home statin  Essential hypertension: Normotensive off home antihypertensive meds. -Resume metoprolol at reduced dose (12.5 mg twice daily)  Cognitive impairment: Seems to be at baseline.  Oriented to self and place -Reorientation and delirium precautions.  Normocytic anemia: Hgb slightly low since admission likely dilutional. However, it trended from 13-8.3 over the last 8 months. Recent Labs    12/07/19 2145 02/05/20 0421 03/19/20 4010 03/20/20 0536 03/22/20 0654 03/23/20 0409 03/24/20 0335 03/25/20  4782 11/24/20 1145 11/25/20 0553  HGB 16.3 14.2 13.1 13.0 11.6* 10.6* 11.5* 10.8* 9.1* 8.3*  -Check anemia panel -Continue monitoring -Hemoccult if further drop  History of childhood polio with monoparesis of right leg -PT/OT and supportive care.  BPH without LUTS -Monitor urine output.  Hypothyroidism -Continue home Synthroid    Body mass index is 31 kg/m.        DVT prophylaxis:  heparin injection 5,000 Units Start: 11/25/20 0600  Code  Status: Full code Family Communication: Patient and/or RN. Available if any question.  Level of care: Med-Surg Status is: Inpatient  Remains inpatient appropriate because:Unsafe d/c plan, IV treatments appropriate due to intensity of illness or inability to take PO, and Inpatient level of care appropriate due to severity of illness  Dispo: The patient is from: Home              Anticipated d/c is to: SNF              Patient currently is not medically stable to d/c.   Difficult to place patient No       Consultants:  None   Sch Meds:  Scheduled Meds:  aspirin EC  81 mg Oral Daily   atorvastatin  20 mg Oral QPM   brimonidine  1 drop Both Eyes BID   dorzolamide-timolol  1 drop Both Eyes BID   gabapentin  600 mg Oral BID   heparin injection (subcutaneous)  5,000 Units Subcutaneous Q8H   insulin detemir  16 Units Subcutaneous BID   latanoprost  1 drop Both Eyes QHS   levothyroxine  88 mcg Oral QHS   memantine  10 mg Oral BID   metoprolol tartrate  12.5 mg Oral BID   multivitamin with minerals  1 tablet Oral Daily   Netarsudil Dimesylate  1 drop Both Eyes QHS   pioglitazone  15 mg Oral QAC breakfast   tamsulosin  0.4 mg Oral BID   Continuous Infusions:  sodium chloride 1,000 mL (11/25/20 0137)   lactated ringers 100 mL/hr at 11/25/20 0643   PRN Meds:.acetaminophen **OR** acetaminophen, ondansetron **OR** ondansetron (ZOFRAN) IV  Antimicrobials: Anti-infectives (From admission, onward)    None        I have personally reviewed the following labs and images: CBC: Recent Labs  Lab 11/24/20 1145 11/25/20 0553  WBC 7.2 7.1  NEUTROABS 5.0  --   HGB 9.1* 8.3*  HCT 29.0* 25.6*  MCV 99.7 98.5  PLT 225 230   BMP &GFR Recent Labs  Lab 11/24/20 1145 11/25/20 0553  NA 137 139  K 4.8 4.4  CL 109 109  CO2 21* 21*  GLUCOSE 247* 217*  BUN 82* 71*  CREATININE 2.55* 2.08*  CALCIUM 8.5* 7.9*   Estimated Creatinine Clearance: 27.4 mL/min (A) (by C-G formula  based on SCr of 2.08 mg/dL (H)). Liver & Pancreas: Recent Labs  Lab 11/24/20 1145  AST 18  ALT 19  ALKPHOS 109  BILITOT 0.1*  PROT 6.9  ALBUMIN 2.9*   Recent Labs  Lab 11/24/20 1145  LIPASE 46   No results for input(s): AMMONIA in the last 168 hours. Diabetic: No results for input(s): HGBA1C in the last 72 hours. Recent Labs  Lab 11/24/20 1138  GLUCAP 238*   Cardiac Enzymes: No results for input(s): CKTOTAL, CKMB, CKMBINDEX, TROPONINI in the last 168 hours. No results for input(s): PROBNP in the last 8760 hours. Coagulation Profile: No results for input(s): INR, PROTIME in the last 168 hours. Thyroid  Function Tests: No results for input(s): TSH, T4TOTAL, FREET4, T3FREE, THYROIDAB in the last 72 hours. Lipid Profile: No results for input(s): CHOL, HDL, LDLCALC, TRIG, CHOLHDL, LDLDIRECT in the last 72 hours. Anemia Panel: Recent Labs    11/25/20 0553  VITAMINB12 464  FOLATE 35.5  FERRITIN 65  TIBC 234*  IRON 44*  RETICCTPCT 1.4   Urine analysis:    Component Value Date/Time   COLORURINE YELLOW 11/24/2020 2200   APPEARANCEUR HAZY (A) 11/24/2020 2200   LABSPEC 1.018 11/24/2020 2200   PHURINE 5.0 11/24/2020 2200   GLUCOSEU NEGATIVE 11/24/2020 2200   HGBUR SMALL (A) 11/24/2020 2200   BILIRUBINUR NEGATIVE 11/24/2020 2200   KETONESUR NEGATIVE 11/24/2020 2200   PROTEINUR NEGATIVE 11/24/2020 2200   UROBILINOGEN 0.2 10/25/2014 1520   NITRITE NEGATIVE 11/24/2020 2200   LEUKOCYTESUR SMALL (A) 11/24/2020 2200   Sepsis Labs: Invalid input(s): PROCALCITONIN, [place]  Microbiology: Recent Results (from the past 240 hour(s))  Resp Panel by RT-PCR (Flu A&B, Covid) Nasopharyngeal Swab     Status: None   Collection Time: 11/25/20 12:56 AM   Specimen: Nasopharyngeal Swab; Nasopharyngeal(NP) swabs in vial transport medium  Result Value Ref Range Status   SARS Coronavirus 2 by RT PCR NEGATIVE NEGATIVE Final    Comment: (NOTE) SARS-CoV-2 target nucleic acids are NOT  DETECTED.  The SARS-CoV-2 RNA is generally detectable in upper respiratory specimens during the acute phase of infection. The lowest concentration of SARS-CoV-2 viral copies this assay can detect is 138 copies/mL. A negative result does not preclude SARS-Cov-2 infection and should not be used as the sole basis for treatment or other patient management decisions. A negative result may occur with  improper specimen collection/handling, submission of specimen other than nasopharyngeal swab, presence of viral mutation(s) within the areas targeted by this assay, and inadequate number of viral copies(<138 copies/mL). A negative result must be combined with clinical observations, patient history, and epidemiological information. The expected result is Negative.  Fact Sheet for Patients:  EntrepreneurPulse.com.au  Fact Sheet for Healthcare Providers:  IncredibleEmployment.be  This test is no t yet approved or cleared by the [place] FDA and  has been authorized for detection and/or diagnosis of SARS-CoV-2 by FDA under an Emergency Use Authorization (EUA). This EUA will remain  in effect (meaning this test can be used) for the duration of the COVID-19 declaration under Section 564(b)(1) of the Act, 21 U.S.C.section 360bbb-3(b)(1), unless the authorization is terminated  or revoked sooner.       Influenza A by PCR NEGATIVE NEGATIVE Final   Influenza B by PCR NEGATIVE NEGATIVE Final    Comment: (NOTE) The Xpert Xpress SARS-CoV-2/FLU/RSV plus assay is intended as an aid in the diagnosis of influenza from Nasopharyngeal swab specimens and should not be used as a sole basis for treatment. Nasal washings and aspirates are unacceptable for Xpert Xpress SARS-CoV-2/FLU/RSV testing.  Fact Sheet for Patients: EntrepreneurPulse.com.au  Fact Sheet for Healthcare Providers: IncredibleEmployment.be  This test is not yet  approved or cleared by the [place] FDA and has been authorized for detection and/or diagnosis of SARS-CoV-2 by FDA under an Emergency Use Authorization (EUA). This EUA will remain in effect (meaning this test can be used) for the duration of the COVID-19 declaration under Section 564(b)(1) of the Act, 21 U.S.C. section 360bbb-3(b)(1), unless the authorization is terminated or revoked.  Performed at [facility], [place] [address] [place], [place] [address]     Radiology Studies: MR BRAIN WO CONTRAST  Result Date:  11/25/2020 CLINICAL DATA:  Initial evaluation for neuro deficit, stroke suspected. EXAM: MRI HEAD WITHOUT CONTRAST TECHNIQUE: Multiplanar, multiecho pulse sequences of the brain and surrounding structures were obtained without intravenous contrast. COMPARISON:  Prior CT from 03/19/2020. FINDINGS: Brain: Generalized age-related cerebral atrophy. Patchy and confluent T2/FLAIR hyperintensity seen involving the periventricular, deep, and subcortical white matter both cerebral hemispheres as well as the midbrain and pons, most like related chronic microvascular ischemic disease, moderate in nature. Remote lacunar infarct present at the left thalamus. Multiple scattered remote bilateral cerebellar infarcts noted. No abnormal foci of restricted diffusion to suggest acute or subacute ischemia. Gray-white matter differentiation maintained. No other areas of encephalomalacia to suggest chronic cortical infarction. No foci of susceptibility artifact to suggest acute or chronic intracranial hemorrhage. No mass lesion, midline shift or mass effect. Ventricular prominence somewhat out of proportion to cortical sulcation, which could reflect a degree of NPH. No extra-axial fluid collection. Pituitary gland within normal limits. Midline structures intact. Vascular: Major intracranial vascular flow voids are maintained. Skull and upper cervical spine: Degenerative changes noted about the C1-2  articulation with thickening of the tectorial membrane. Craniocervical junction otherwise unremarkable. Trace degenerative anterolisthesis of C4 on C5 noted. Diffusely decreased T1 signal intensity seen within the visualized bone marrow, nonspecific, but most commonly related to anemia, smoking, or obesity. No focal marrow replacing lesion. No scalp soft tissue abnormality. Sinuses/Orbits: Patient status post bilateral ocular lens replacement. Globes and orbital soft tissues demonstrate no acute finding. Paranasal sinuses are largely clear. Moderate bilateral mastoid effusions, right worse than left. Visualized nasopharynx within normal limits. Other: None. IMPRESSION: 1. No acute intracranial abnormality. 2. Ventricular prominence somewhat out of proportion to cortical sulcation, which could reflect a degree of NPH. 3. Multiple remote bilateral cerebellar infarcts, with additional remote lacunar infarct involving the left thalamus. 4. Moderate chronic microvascular ischemic disease. 5. Moderate bilateral mastoid effusions, right worse than left. Electronically Signed   By: Jeannine Boga M.D.   On: 11/25/2020 00:36       Arsalan Brisbin T. [place]  If 7PM-7AM, please contact night-coverage [email or web address] 11/25/2020, 1:02 PM

## 2020-11-25 NOTE — Evaluation (Signed)
Occupational Therapy Evaluation Patient Details Name: Mike Mack MRN: [id number] DOB: [birth year] Today's Date: 11/25/2020    History of Present Illness Pt presented to ED 6/8 with weakness and inability to ambulate. Pt found to have AKI. PMH - polio with chronic weakness of RLE, DM, HTN, anemia, CVA   Clinical Impression   Patient admitted for the diagnosis above.  PTA admission he states he lives at home with his spouse.  The patient states he is able to complete his own self care and toileting, but no longer drives, and his spouse assists with meal prep, home management and community mobility.  Unclear if a PCA assists in his care, or if it is truly just family assist.  Barriers are listed below.  Given he needs up to Moderate assist of 2 for basic mobility, and that the spouse cannot physically assist him, SNF for post acute rehab is recommended.  OT will follow in the acute setting to maximize his functional status.      Follow Up Recommendations  SNF    Equipment Recommendations  None recommended by OT    Recommendations for Other Services       Precautions / Restrictions Precautions Precautions: Fall Precaution Comments: chronic R LE dysfunction Restrictions Weight Bearing Restrictions: No      Mobility Bed Mobility Overal bed mobility: Needs Assistance Bed Mobility: Supine to Sit;Sit to Supine     Supine to sit: Mod assist Sit to supine: Mod assist   General bed mobility comments: assist for truck and to scoot forward.  Assist bringing legs back onto the bed.  Has a tendency to lean back instead of to the side for sit to supine. Patient Response: Cooperative  Transfers Overall transfer level: Needs assistance Equipment used: Rolling walker (2 wheeled) Transfers: Sit to/from Stand Sit to Stand: +2 physical assistance;Mod assist         General transfer comment: patient deferred, had been up to the chair earlier, and was resting.    Balance Overall  balance assessment: Needs assistance Sitting-balance support: Feet supported Sitting balance-Leahy Scale: Fair Sitting balance - Comments: UE support Postural control: Posterior lean Standing balance support: Bilateral upper extremity supported Standing balance-Leahy Scale: Poor Standing balance comment: walker and  min assist for static standing                           ADL either performed or assessed with clinical judgement   ADL Overall ADL's : Needs assistance/impaired                 Upper Body Dressing : Minimal assistance;Sitting   Lower Body Dressing: Maximal assistance;Bed level                       Vision Baseline Vision/History: Wears glasses Wears Glasses: At all times Patient Visual Report: No change from baseline       Perception     Praxis      Pertinent Vitals/Pain Pain Assessment: No/denies pain     Hand Dominance Right   Extremity/Trunk Assessment Upper Extremity Assessment Upper Extremity Assessment: Generalized weakness   Lower Extremity Assessment Lower Extremity Assessment: Defer to PT evaluation RLE Deficits / Details: baseline weakness due to polio. Strenght <3/5   Cervical / Trunk Assessment Cervical / Trunk Assessment: Kyphotic   Communication Communication Communication: No difficulties   Cognition Arousal/Alertness: Awake/alert Behavior During Therapy: WFL for tasks assessed/performed Overall Cognitive Status: No family/caregiver  present to determine baseline cognitive functioning Area of Impairment: Orientation;Memory;Following commands                 Orientation Level: Disoriented to;Place;Time;Situation   Memory: Decreased short-term memory Following Commands: Follows one step commands consistently       General Comments: baseline cognitive impairment per chart.   General Comments       Exercises     Shoulder Instructions      Home Living Family/patient expects to be discharged  to:: Private residence Living Arrangements: Spouse/significant other Available Help at Discharge: Family;Available 24 hours/day Type of Home: House Home Access: Stairs to enter [employer] of Steps: 3-4 Entrance Stairs-Rails: Right;Left;Can reach both Home Layout: One level     Bathroom Shower/Tub: Occupational psychologist: Handicapped height Bathroom Accessibility: Yes How Accessible: Accessible via walker Home Equipment: [place] - 2 wheels;Cane - single point;Wheelchair - manual;Grab bars - tub/shower;Grab bars - toilet;Shower seat;Hand held shower head          Prior Functioning/Environment Level of Independence: Needs assistance  Gait / Transfers Assistance Needed: per chart: amb short distance modified independence with walker.     Comments: Patient states he bathes and dresses himself.  Spouse completes meals and home management.  Patient does not drive.        OT Problem List: Decreased strength;Decreased activity tolerance;Impaired balance (sitting and/or standing);Decreased safety awareness;Decreased cognition      OT Treatment/Interventions: Self-care/ADL training;Therapeutic exercise;DME and/or AE instruction;Balance training;Therapeutic activities    OT Goals(Current goals can be found in the care plan section) Acute Rehab OT Goals Patient Stated Goal: staing "I need to move better" OT Goal Formulation: With patient Time For Goal Achievement: 12/09/20 Potential to Achieve Goals: Good ADL Goals Pt Will Perform Grooming: with set-up;sitting Pt Will Perform Upper Body Bathing: with set-up;sitting Pt Will Perform Upper Body Dressing: with set-up;sitting Pt Will Transfer to Toilet: with min guard assist;stand pivot transfer;bedside commode Pt Will Perform Toileting - Clothing Manipulation and hygiene: with min assist;sit to/from stand  OT Frequency: Min 2X/week   Barriers to D/C:    none noted       Co-evaluation               AM-PAC OT "6 Clicks" Daily Activity     Outcome Measure Help from another person eating meals?: A Little Help from another person taking care of personal grooming?: A Little Help from another person toileting, which includes using toliet, bedpan, or urinal?: A Lot Help from another person bathing (including washing, rinsing, drying)?: A Lot Help from another person to put on and taking off regular upper body clothing?: A Little Help from another person to put on and taking off regular lower body clothing?: A Lot 6 Click Score: 15   End of Session    Activity Tolerance: Patient tolerated treatment well Patient left: in bed;with call bell/phone within reach;with bed alarm set  OT Visit Diagnosis: Muscle weakness (generalized) (M62.81);History of falling (Z91.81);Other symptoms and signs involving cognitive function                Time: 3009-2330 OT Time Calculation (min): 14 min Charges:  OT General Charges $OT Visit: 1 Visit OT Evaluation $OT Eval Moderate Complexity: 1 Mod  11/25/2020  Rich, OTR/L  Acute Rehabilitation Services  Office:  [place] 11/25/2020, 3:24 PM

## 2020-11-25 NOTE — Plan of Care (Signed)

## 2020-11-25 NOTE — H&P (Signed)
History and Physical    Katelyn Kohlmeyer GGY:[id number] DOB: 16-Jun-1936 DOA: 11/24/2020  PCP: Jani Gravel, MD   Patient coming from: Home  Chief Complaint: weakness  HPI: Mike Mack is a 85 y.o. male with medical history significant for DMT2, HTN, BPH, anemia, hx of polio causing atrophy and weakness of right leg. Presents for evaluation of generalized weakness. He normally ambulates with aide of walker.  He has not been able to push himself up in bed or stand up and walk even with his walker due to generalized weakness for the last 2 days.  He has not had any falls and no loss of consciousness.  He denies any headache, chest pain, back pain, abdominal pain, nausea vomiting diarrhea.  He has not had any cough, shortness of breath or fever.  Lives at home with his wife.  Had any confusion according to the wife.  In the slurred speech or drooping face.  ED Course:   Mr. Savidge has been hemodynamically stable in the emergency room.  Labs reveal acute kidney injury with a creatinine of 2.55.  His baseline creatinine is 1.1-1.4.  Glucose is 247.  Electrolytes are normal.  CBC shows an anemia with a hemoglobin of 9.1 and hematocrit 29.0 which is similar to his baseline WBCs are normal.  MRI shows no acute stroke.  He does have remote strokes that are unchanged.  Radiologist states that the ventricles are slightly more prominent could represent a degree of NPH.  Hospitalist service asked to admit for further management  Review of Systems:  General: Denies fever, chills, weight loss, night sweats.  Denies dizziness.  Denies change in appetite HENT: Denies head trauma, headache, denies change in hearing, tinnitus.  Denies nasal bleeding.  Denies sore throat,  Denies difficulty swallowing Eyes: Denies blurry vision, pain in eye, drainage.  Denies discoloration of eyes. Neck: Denies pain.  Denies swelling.  Denies pain with movement. Cardiovascular: Denies chest pain, palpitations.  Denies edema.  Denies  orthopnea Respiratory: Denies shortness of breath, cough. Denies wheezing. Denies sputum production Gastrointestinal: Denies abdominal pain, swelling. Denies nausea, vomiting, diarrhea. Denies melena. Denies hematemesis. Musculoskeletal: Has chronic limitation of movement of right leg. Denies deformity or swelling. Denies arthralgias or myalgias. Genitourinary: Denies pelvic pain.  Denies urinary frequency or hesitancy.  Denies dysuria.  Skin: Denies rash.  Denies petechiae, purpura, ecchymosis. Neurological: Denies syncope. Denies seizure activity. Denies slurred speech, drooping face. Denies visual change. Psychiatric: Denies depression, anxiety. Denies hallucinations.  Past Medical History:  Diagnosis Date   Anemia 05/13/2013   Arthritis    "right leg" (07/25/2013)   BPH (benign prostatic hyperplasia)    Constipation 05/13/2013   Exertional shortness of breath    "sometimes" (07/25/2013)   History of kidney stones    History of stomach ulcers    Hypertension    Neuropathy    Polio    Polio Childhood   Small bowel obstruction ([place])    Stroke ([place]) 2014   residual:  "left hand kind of numb" (07/25/2013)   Type II diabetes mellitus ([place])     Past Surgical History:  Procedure Laterality Date   BOWEL RESECTION     CATARACT EXTRACTION W/ INTRAOCULAR LENS  IMPLANT, BILATERAL Bilateral 2000's   CHOLECYSTECTOMY N/A 07/25/2013   Procedure: LAPAROSCOPIC CHOLECYSTECTOMY WITH INTRAOPERATIVE CHOLANGIOGRAM;  Surgeon: [name], MD;  Location: [facility];  Service: General;  Laterality: N/A;   COLON SURGERY     COLONOSCOPY     Hx: of  CYSTOSCOPY WITH BIOPSY N/A 12/01/2019   Procedure: CYSTOSCOPY WITH BLADDER BIOPSY AND FULGURATION;  Surgeon: Lucas Mallow, MD;  Location: [facility];  Service: Urology;  Laterality: N/A;   EXCISIONAL HEMORRHOIDECTOMY  2000's   EXTRACORPOREAL SHOCK WAVE LITHOTRIPSY Right 10/03/2018   Procedure: EXTRACORPOREAL SHOCK WAVE LITHOTRIPSY (ESWL);  Surgeon: Festus Aloe, MD;  Location: [facility];  Service: Urology;  Laterality: Right;   EYE SURGERY     INGUINAL HERNIA REPAIR Right 1970's   IR RADIOLOGIST EVAL & MGMT  04/06/2020   LAPAROSCOPIC CHOLECYSTECTOMY  07/25/2013   w/LOA (07/25/2013)    Social History  reports that he has never smoked. He has never used smokeless tobacco. He reports that he does not drink alcohol and does not use drugs.  Allergies  Allergen Reactions   Metformin And Related Other (See Comments)    Gi intolerance   Other     Other reaction(s): Other (See Comments) Renal insufficiency Other reaction(s): GI Upset (intolerance) Gi intolerance   [name] [Insulin Glargine-Lixisenatide] Other (See Comments)    Stomach cramps    Family History  Problem Relation Age of Onset   Heart failure Mother    Diabetes Sister    Diabetes Sister    Diabetes Brother    Hypertension Sister    Hypertension Sister    Hypertension Brother    Heart attack Sister      Prior to Admission medications   Medication Sig Start Date End Date Taking? Authorizing Provider  aspirin EC 81 MG tablet Take 81 mg by mouth daily.    [provider]  atorvastatin (LIPITOR) 20 MG tablet Take 20 mg by mouth every evening.  11/03/13   [provider]  B Complex Vitamins (B COMPLEX PO) Take 1 tablet by mouth daily.    [provider]  brimonidine (ALPHAGAN) 0.2 % ophthalmic solution Place 1 drop into both eyes 2 (two) times daily. 12/28/19   [provider]  cholecalciferol (VITAMIN D-400) 10 MCG (400 UNIT) TABS tablet Take 400 Units by mouth daily.    [provider]  dorzolamide-timolol (COSOPT) 22.3-6.8 MG/ML ophthalmic solution Place 1 drop into both eyes 2 (two) times daily. 03/04/20   [provider]  feeding supplement, GLUCERNA SHAKE, (GLUCERNA SHAKE) LIQD Take 237 mLs by mouth 3 (three) times daily between meals. 03/25/20   Antonieta Pert, MD  gabapentin (NEURONTIN) 600 MG tablet Take 600 mg by mouth 2  (two) times daily. 01/20/20   [provider]  glucose blood test strip  04/04/15   [provider]  insulin detemir (LEVEMIR) 100 UNIT/ML injection Inject 16 Units into the skin 2 (two) times daily.     [provider]  latanoprost (XALATAN) 0.005 % ophthalmic solution Place 1 drop into both eyes at bedtime.  11/17/14   [provider]  levothyroxine (SYNTHROID) 88 MCG tablet Take 88 mcg by mouth at bedtime.  04/21/11   [provider]  metoprolol tartrate (LOPRESSOR) 50 MG tablet Take 1 tablet (50 mg total) by mouth 2 (two) times daily. 03/25/20   Antonieta Pert, MD  Multiple Vitamin (MULTIVITAMIN WITH MINERALS) TABS tablet Take 1 tablet by mouth daily. Patient not taking: Reported on 03/23/2019 09/06/13   [name], MD  olmesartan (BENICAR) 20 MG tablet Take 20 mg by mouth daily. 02/20/20   [provider]  ondansetron (ZOFRAN ODT) 4 MG disintegrating tablet 4mg  ODT q4 hours prn nausea/vomit Patient not taking: Reported on 11/12/2019 08/24/19   Darl Householder,  Lujean Rave, MD  pioglitazone (ACTOS) 30 MG tablet Take 15 mg by mouth daily. 03/04/20   [provider]  polyethylene glycol (MIRALAX / GLYCOLAX) packet Take 17 g by mouth daily. Patient not taking: Reported on 11/12/2019 09/06/13   [name], MD  RHOPRESSA 0.02 % SOLN Place 1 drop into both eyes at bedtime. 03/05/20   [provider]  Tamsulosin HCl (FLOMAX) 0.4 MG CAPS Take 0.4 mg by mouth 2 (two) times daily.     [provider]    Physical Exam: Vitals:   11/25/20 0115 11/25/20 0130 11/25/20 0145 11/25/20 0200  BP: (!) 118/58 118/64 (!) 123/56 (!) 126/50  Pulse: 68 74 80 69  Resp: 13 15 14 16   Temp:      TempSrc:      SpO2: 98% 98% 97% 98%    Constitutional: NAD, calm, comfortable Vitals:   11/25/20 0115 11/25/20 0130 11/25/20 0145 11/25/20 0200  BP: (!) 118/58 118/64 (!) 123/56 (!) 126/50  Pulse: 68 74 80 69  Resp: 13 15 14 16   Temp:       TempSrc:      SpO2: 98% 98% 97% 98%   General: WDWN, Alert and oriented x3.  Eyes: EOMI, PERRL, conjunctivae normal.  Sclera nonicteric HENT:  [place]/AT, external ears normal.  Nares patent without epistasis.  Mucous membranes are dry Neck: Soft, normal range of motion, supple, no masses, Trachea midline Respiratory: clear to auscultation bilaterally, no wheezing, no crackles. Normal respiratory effort. No accessory muscle use.  Cardiovascular: Regular rate and rhythm, no murmurs / rubs / gallops. No extremity edema. 1+ pedal pulses Abdomen: Soft, no tenderness, nondistended, no rebound or guarding. No masses palpated. Bowel sounds normoactive Musculoskeletal: Right lower extremity thin and atrophied with minimal movement. no cyanosis. No joint deformity upper and lower extremities. Normal muscle tone upper extremities and left lower extremity Skin: Warm, dry, intact no rashes, lesions, ulcers. No induration Neurologic: CN 2-12 grossly intact.  Normal speech.  Sensation intact. Strength 4/5 in upper extremities and left leg Psychiatric: Normal judgment and insight.  Normal mood.    Labs on Admission: I have personally reviewed following labs and imaging studies  CBC: Recent Labs  Lab 11/24/20 1145  WBC 7.2  NEUTROABS 5.0  HGB 9.1*  HCT 29.0*  MCV 99.7  PLT 096    Basic Metabolic Panel: Recent Labs  Lab 11/24/20 1145  NA 137  K 4.8  CL 109  CO2 21*  GLUCOSE 247*  BUN 82*  CREATININE 2.55*  CALCIUM 8.5*    GFR: CrCl cannot be calculated (Unknown ideal weight.).  Liver Function Tests: Recent Labs  Lab 11/24/20 1145  AST 18  ALT 19  ALKPHOS 109  BILITOT 0.1*  PROT 6.9  ALBUMIN 2.9*    Urine analysis:    Component Value Date/Time   COLORURINE YELLOW 11/24/2020 2200   APPEARANCEUR HAZY (A) 11/24/2020 2200   LABSPEC 1.018 11/24/2020 2200   PHURINE 5.0 11/24/2020 2200   GLUCOSEU NEGATIVE 11/24/2020 2200   HGBUR SMALL (A) 11/24/2020 2200   BILIRUBINUR NEGATIVE  11/24/2020 2200   KETONESUR NEGATIVE 11/24/2020 2200   PROTEINUR NEGATIVE 11/24/2020 2200   UROBILINOGEN 0.2 10/25/2014 1520   NITRITE NEGATIVE 11/24/2020 2200   LEUKOCYTESUR SMALL (A) 11/24/2020 2200    Radiological Exams on Admission: MR BRAIN WO CONTRAST  Result Date: 11/25/2020 CLINICAL DATA:  Initial evaluation for neuro deficit, stroke suspected. EXAM: MRI HEAD WITHOUT CONTRAST TECHNIQUE: Multiplanar, multiecho pulse sequences of the  brain and surrounding structures were obtained without intravenous contrast. COMPARISON:  Prior CT from 03/19/2020. FINDINGS: Brain: Generalized age-related cerebral atrophy. Patchy and confluent T2/FLAIR hyperintensity seen involving the periventricular, deep, and subcortical white matter both cerebral hemispheres as well as the midbrain and pons, most like related chronic microvascular ischemic disease, moderate in nature. Remote lacunar infarct present at the left thalamus. Multiple scattered remote bilateral cerebellar infarcts noted. No abnormal foci of restricted diffusion to suggest acute or subacute ischemia. Gray-white matter differentiation maintained. No other areas of encephalomalacia to suggest chronic cortical infarction. No foci of susceptibility artifact to suggest acute or chronic intracranial hemorrhage. No mass lesion, midline shift or mass effect. Ventricular prominence somewhat out of proportion to cortical sulcation, which could reflect a degree of NPH. No extra-axial fluid collection. Pituitary gland within normal limits. Midline structures intact. Vascular: Major intracranial vascular flow voids are maintained. Skull and upper cervical spine: Degenerative changes noted about the C1-2 articulation with thickening of the tectorial membrane. Craniocervical junction otherwise unremarkable. Trace degenerative anterolisthesis of C4 on C5 noted. Diffusely decreased T1 signal intensity seen within the visualized bone marrow, nonspecific, but most commonly  related to anemia, smoking, or obesity. No focal marrow replacing lesion. No scalp soft tissue abnormality. Sinuses/Orbits: Patient status post bilateral ocular lens replacement. Globes and orbital soft tissues demonstrate no acute finding. Paranasal sinuses are largely clear. Moderate bilateral mastoid effusions, right worse than left. Visualized nasopharynx within normal limits. Other: None. IMPRESSION: 1. No acute intracranial abnormality. 2. Ventricular prominence somewhat out of proportion to cortical sulcation, which could reflect a degree of NPH. 3. Multiple remote bilateral cerebellar infarcts, with additional remote lacunar infarct involving the left thalamus. 4. Moderate chronic microvascular ischemic disease. 5. Moderate bilateral mastoid effusions, right worse than left. Electronically Signed   By: Jeannine Boga M.D.   On: 11/25/2020 00:36    EKG: Independently reviewed. EKG shows NSR with PAC. No acute ST elevation or depression. Qtc is 433  Assessment/Plan Principal Problem:   AKI (acute kidney injury)  Mr. Azbill with AKI with creatinine of 2.55 from baseline of 1.3-1.4. Pt has not been drinking much per wife the past few days.  IVF hydration with LR at 100 ml/hr.  No urinary retention on bladder scan per Er physician. Recheck renal function and electrolytes in am  Active Problems:   Hypertension Continue home medications once pharmacy verifys and reconciles meds.     Generalized weakness Consult PT for evaluation.     Controlled type 2 diabetes mellitus with stable proliferative retinopathy of both eyes, with long-term current use of insulin  Continue basal insulin. Monitor blood sugar with meals and bedtime. SSI for glycemic control as needed.  Check HgbA1c    Anemia Chronic. Recheck CBC in am    Monoparesis of leg Chronic secondary to polio as child.    DVT prophylaxis: Heparin for DVT prophylaxis. Padua score elevated.   Code Status:   Full Code  Family  Communication:  Diagnosis and plan discussed with the patient and his wife who is at bedside.  Questions answered.  They verbalized understanding agree with plan.  Further recommendations to follow as clinical indicated Disposition Plan:   Patient is from:  Home  Anticipated DC to:  Home  Anticipated DC date:  Anticipate 2 midnight or more stay  Anticipated DC barriers: No barriers to discharge identified at this time   Admission status:  Inpatient    Yevonne Aline Eleina Jergens MD [facility]  How to contact  the [facility] Attending or Consulting provider [place] or covering provider during after hours [place], for this patient?   Check the care team in [facility] and look for a) attending/consulting [facility] provider listed and b) the [facility] team listed Log into [email or web address] and use [place] universal password to access. If you do not have the password, please contact the hospital operator. Locate the [facility] provider you are looking for under [facility] and page to a number that you can be directly reached. If you still have difficulty reaching the provider, please page the [facility] (Director on Call) for the Hospitalists listed on amion for assistance.  11/25/2020, 2:29 AM

## 2020-11-25 NOTE — ED Notes (Signed)
Patient currently at MRI

## 2020-11-25 NOTE — ED Provider Notes (Signed)
I assumed care of this patient.  Please see previous provider note for further details of Hx, PE.  Briefly patient is a 85 y.o. male who presented weakness.  Labs notable for AKI.  BUN elevated.  Per Dr. Lloyd Huger DRE exam patient did not have any stool in the rectal vault.  Unable to get a Hemoccult.  Patient is currently awaiting an MRI.  Plan to follow-up MRIs to rule out new stroke and admit.  MRI negative. Patient admitted to medicine for AKI and generalized fatigue.      Fatima Blank, MD 11/25/20 [phone]

## 2020-11-26 LAB — RENAL FUNCTION PANEL
Albumin: 2.6 g/dL — ABNORMAL LOW (ref 3.5–5.0)
Anion gap: 4 — ABNORMAL LOW (ref 5–15)
BUN: 50 mg/dL — ABNORMAL HIGH (ref 8–23)
CO2: 24 mmol/L (ref 22–32)
Calcium: 8.6 mg/dL — ABNORMAL LOW (ref 8.9–10.3)
Chloride: 111 mmol/L (ref 98–111)
Creatinine, Ser: 1.63 mg/dL — ABNORMAL HIGH (ref 0.61–1.24)
GFR, Estimated: 42 mL/min — ABNORMAL LOW (ref >60)
Glucose, Bld: 131 mg/dL — ABNORMAL HIGH (ref 70–99)
Phosphorus: 2.2 mg/dL — ABNORMAL LOW (ref 2.5–4.6)
Potassium: 4.5 mmol/L (ref 3.5–5.1)
Sodium: 139 mmol/L (ref 135–145)

## 2020-11-26 LAB — CBC
HCT: 26.1 % — ABNORMAL LOW (ref 39.0–52.0)
Hemoglobin: 8.5 g/dL — ABNORMAL LOW (ref 13.0–17.0)
MCH: 32 pg (ref 26.0–34.0)
MCHC: 32.6 g/dL (ref 30.0–36.0)
MCV: 98.1 fL (ref 80.0–100.0)
Platelets: 234 10*3/uL (ref 150–400)
RBC: 2.66 MIL/uL — ABNORMAL LOW (ref 4.22–5.81)
RDW: 13.8 % (ref 11.5–15.5)
WBC: 9.1 10*3/uL (ref 4.0–10.5)
nRBC: 0 % (ref 0.0–0.2)

## 2020-11-26 LAB — HEMOGLOBIN A1C
Hgb A1c MFr Bld: 6.5 % — ABNORMAL HIGH (ref 4.8–5.6)
Mean Plasma Glucose: 140 mg/dL

## 2020-11-26 LAB — GLUCOSE, CAPILLARY
Glucose-Capillary: 118 mg/dL — ABNORMAL HIGH (ref 70–99)
Glucose-Capillary: 197 mg/dL — ABNORMAL HIGH (ref 70–99)
Glucose-Capillary: 60 mg/dL — ABNORMAL LOW (ref 70–99)
Glucose-Capillary: 70 mg/dL (ref 70–99)
Glucose-Capillary: 143 mg/dL — ABNORMAL HIGH (ref 70–99)

## 2020-11-26 LAB — MAGNESIUM: Magnesium: 2.6 mg/dL — ABNORMAL HIGH (ref 1.7–2.4)

## 2020-11-26 MED ORDER — POLYETHYLENE GLYCOL 3350 17 G PO PACK
17.0000 g | PACK | Freq: Every day | ORAL | Status: DC
  Administered 2020-11-26 – 2020-11-29 (×4): 17 g via ORAL
  Filled 2020-11-26 (×4): qty 1

## 2020-11-26 MED ORDER — BISACODYL 10 MG RE SUPP
10.0000 mg | Freq: Once | RECTAL | Status: AC
  Administered 2020-11-26: 10 mg via RECTAL
  Filled 2020-11-26: qty 1

## 2020-11-26 NOTE — TOC Initial Note (Signed)
Transition of Care [facility]) - Initial/Assessment Note    Patient Details  Name: Mike Mack MRN: [id number] Date of Birth: [birth year]  Transition of Care [facility]) CM/SW Contact:    Emeterio Reeve, [place] Phone Number: 11/26/2020, 4:16 PM  Clinical Narrative:                  CSW met with pt and wife, [name] at bedside. CSW introduced self and explained her role at the hospital.  [name] stated tht PTA pt lived at home with her and was independent with mobility and ADLs. [name] reports pt does use a walker. Dorothy reports pt is cov vaccinated with booster.   CSW reviewed PT/OT notes of SNF. [name] is in agreement that pt needs SNF. [name] has no preference at this time. CSW was given permission to fax out to facilities in the area.   TOC will follow.   Expected Discharge Plan: Skilled Nursing Facility Barriers to Discharge: Continued Medical Work up   Patient Goals and CMS Choice Patient states their goals for this hospitalization and ongoing recovery are:: Pt unable to partiicpate in goal setting CMS Medicare.gov Compare Post Acute Care list provided to:: Patient Choice offered to / list presented to : Spouse  Expected Discharge Plan and Services Expected Discharge Plan: [place] arrangements for the past 2 months: Single Family Home                                      Prior Living Arrangements/Services Living arrangements for the past 2 months: Single Family Home Lives with:: Spouse   Do you feel safe going back to the place where you live?: Yes               Activities of Daily Living Home Assistive Devices/Equipment: Gilford Rile (specify type) ADL Screening (condition at time of admission) Patient's cognitive ability adequate to safely complete daily activities?: Yes Is the patient deaf or have difficulty hearing?: No Does the patient have difficulty seeing, even when wearing glasses/contacts?: No Does the patient have  difficulty concentrating, remembering, or making decisions?: No Patient able to express need for assistance with ADLs?: Yes Does the patient have difficulty dressing or bathing?: Yes Independently performs ADLs?: No Communication: Independent Dressing (OT): Needs assistance Is this a change from baseline?: Pre-admission baseline Grooming: Independent Feeding: Independent Bathing: Needs assistance Is this a change from baseline?: Pre-admission baseline Toileting: Needs assistance Is this a change from baseline?: Pre-admission baseline In/Out Bed: Needs assistance Is this a change from baseline?: Pre-admission baseline Walks in Home: Needs assistance Is this a change from baseline?: Pre-admission baseline Does the patient have difficulty walking or climbing stairs?: Yes Weakness of Legs: Right Weakness of Arms/Hands: Right  Permission Sought/Granted Permission sought to share information with : Facility Art therapist granted to share information with : Yes, Verbal Permission Granted     Permission granted to share info w AGENCY: SNF        Emotional Assessment   Attitude/Demeanor/Rapport: Unable to Assess Affect (typically observed): Unable to Assess Orientation: : Oriented to Self Alcohol / Substance Use: Not Applicable Psych Involvement: No (comment)  Admission diagnosis:  AKI (acute kidney injury) [facility]) [N17.9] Patient Active Problem List   Diagnosis Date Noted   Subphrenic abscess ([place]) 03/19/2020   Failure to thrive (child) 03/19/2020   Posterior vitreous detachment of both eyes 12/11/2019  Syncope 10/25/2014   AKI (acute kidney injury) ([place]) 10/25/2014   H/O: CVA (cerebrovascular accident) 10/25/2014   H/O poliomyelitis 10/25/2014   Controlled type 2 diabetes mellitus with stable proliferative retinopathy of both eyes, with long-term current use of insulin ([place]) 10/25/2014   Tachycardia 08/29/2013   Cough 08/29/2013   Monoparesis of leg ([place])  08/20/2013   Muscle weakness of lower extremity 08/20/2013   Right upper quadrant abdominal abscess ([place]) 08/15/2013   Hepatic abscess 08/15/2013   Subacute delirium 08/14/2013   Hyperglycemia 08/14/2013   Oral thrush 08/14/2013   Leukocytosis, unspecified 08/14/2013   Debility 08/14/2013   S/P cholecystectomy 07/29/2013   Cholecystitis with cholelithiasis 07/25/2013   Arthritis 06/06/2013   Headache(784.0) 06/06/2013   Headache 06/06/2013   Generalized weakness 05/24/2013   Cholecystitis, acute 05/24/2013   RUQ abdominal pain 05/13/2013   Acute cholecystitis: Probable 05/13/2013   Constipation 05/13/2013   Enterococcus UTI 05/13/2013   Leukocytosis 05/13/2013   Anemia 05/13/2013   Acute left thalamic CVA (cerebral infarction) 04/10/2012   Hypertension    BPH (benign prostatic hyperplasia)    Pruritic disorder 02/02/2011   PCP:  Jani Gravel, MD Pharmacy:   [id number]: [phone]: [phone]    Social Determinants of Health (SDOH) Interventions    Readmission Risk Interventions No flowsheet data found.  MEmeterio Reeve LLatanya Presser LAlturaSocial Worker [phone]

## 2020-11-26 NOTE — NC FL2 (Signed)
[place] LEVEL OF CARE SCREENING TOOL     IDENTIFICATION  Patient Name: Mike Mack Birthdate: 1936-05-13 Sex: male Admission Date (Current Location): 11/24/2020  [place] [facility] and [place] Number:  Herbalist and Address:  [facility] [place], [place] [address], [place], [place] [address]      Provider Number: [id number]  Attending Physician Name and Address:  Mckinley Jewel, MD  Relative Name and Phone Number:       Current Level of Care: Hospital Recommended Level of Care: [place] Prior Approval Number:    Date Approved/Denied:   PASRR Number: [id number]  Discharge Plan: SNF    Current Diagnoses: Patient Active Problem List   Diagnosis Date Noted   Subphrenic abscess ([place]) 03/19/2020   Failure to thrive (child) 03/19/2020   Posterior vitreous detachment of both eyes 12/11/2019   Syncope 10/25/2014   AKI (acute kidney injury) ([place]) 10/25/2014   H/O: CVA (cerebrovascular accident) 10/25/2014   H/O poliomyelitis 10/25/2014   Controlled type 2 diabetes mellitus with stable proliferative retinopathy of both eyes, with long-term current use of insulin ([place]) 10/25/2014   Tachycardia 08/29/2013   Cough 08/29/2013   Monoparesis of leg ([place]) 08/20/2013   Muscle weakness of lower extremity 08/20/2013   Right upper quadrant abdominal abscess ([place]) 08/15/2013   Hepatic abscess 08/15/2013   Subacute delirium 08/14/2013   Hyperglycemia 08/14/2013   Oral thrush 08/14/2013   Leukocytosis, unspecified 08/14/2013   Debility 08/14/2013   S/P cholecystectomy 07/29/2013   Cholecystitis with cholelithiasis 07/25/2013   Arthritis 06/06/2013   Headache(784.0) 06/06/2013   Headache 06/06/2013   Generalized weakness 05/24/2013   Cholecystitis, acute 05/24/2013   RUQ abdominal pain 05/13/2013   Acute cholecystitis: Probable 05/13/2013   Constipation 05/13/2013   Enterococcus UTI 05/13/2013   Leukocytosis 05/13/2013   Anemia  05/13/2013   Acute left thalamic CVA (cerebral infarction) 04/10/2012   Hypertension    BPH (benign prostatic hyperplasia)    Pruritic disorder 02/02/2011    Orientation RESPIRATION BLADDER Height & Weight     Self  Normal Incontinent Weight: 186 lb 4.6 oz (84.5 kg) Height:  5\' 5"  (165.1 cm)  BEHAVIORAL SYMPTOMS/MOOD NEUROLOGICAL BOWEL NUTRITION STATUS      Incontinent Diet (See DC summary)  AMBULATORY STATUS COMMUNICATION OF NEEDS Skin   Extensive Assist Verbally Normal                       Personal Care Assistance Level of Assistance  Bathing, Feeding, Dressing Bathing Assistance: Maximum assistance Feeding assistance: Limited assistance       Functional Limitations Info  Sight, Hearing, Speech Sight Info: Adequate Hearing Info: Adequate Speech Info: Adequate    SPECIAL CARE FACTORS FREQUENCY  PT (By licensed PT), OT (By licensed OT)     PT Frequency: 5x a week OT Frequency: 5x a week            Contractures Contractures Info: Not present    Additional Factors Info  Code Status, Allergies Code Status Info: Full Allergies Info: Metformin And Related   Other   Soliqua (Insulin Glargine-lixisenatide           Current Medications (11/26/2020):  This is the current hospital active medication list Current Facility-Administered Medications  Medication Dose Route Frequency Provider Last Rate Last Admin   0.9 %  sodium chloride infusion  1,000 mL Intravenous Continuous Cardama, Grayce Sessions, MD 125 mL/hr at 11/25/20 0137 1,000 mL at 11/25/20  2993   acetaminophen (TYLENOL) tablet 650 mg  650 mg Oral Q6H PRN Chotiner, Yevonne Aline, MD       Or   acetaminophen (TYLENOL) suppository 650 mg  650 mg Rectal Q6H PRN Chotiner, Yevonne Aline, MD       aspirin EC tablet 81 mg  81 mg Oral Daily Chotiner, Yevonne Aline, MD   81 mg at 11/26/20 0943   atorvastatin (LIPITOR) tablet 20 mg  20 mg Oral QPM Chotiner, Yevonne Aline, MD   20 mg at 11/25/20 1736   brimonidine (ALPHAGAN) 0.2 %  ophthalmic solution 1 drop  1 drop Both Eyes BID Wendee Beavers T, MD   1 drop at 11/26/20 0947   dorzolamide-timolol (COSOPT) 22.3-6.8 MG/ML ophthalmic solution 1 drop  1 drop Both Eyes BID Wendee Beavers T, MD   1 drop at 11/26/20 0948   gabapentin (NEURONTIN) capsule 600 mg  600 mg Oral BID Chotiner, Yevonne Aline, MD   600 mg at 11/26/20 0944   heparin injection 5,000 Units  5,000 Units Subcutaneous Q8H Chotiner, Yevonne Aline, MD   5,000 Units at 11/26/20 1425   insulin aspart (novoLOG) injection 0-5 Units  0-5 Units Subcutaneous QHS Gonfa, Taye T, MD       insulin aspart (novoLOG) injection 0-9 Units  0-9 Units Subcutaneous TID WC Wendee Beavers T, MD   2 Units at 11/26/20 1227   insulin aspart (novoLOG) injection 3 Units  3 Units Subcutaneous TID WC Wendee Beavers T, MD   3 Units at 11/26/20 1228   insulin detemir (LEVEMIR) injection 16 Units  16 Units Subcutaneous BID Chotiner, Yevonne Aline, MD   16 Units at 11/26/20 0944   lactated ringers infusion   Intravenous Continuous Chotiner, Yevonne Aline, MD 100 mL/hr at 11/26/20 0945 New Bag at 11/26/20 0945   latanoprost (XALATAN) 0.005 % ophthalmic solution 1 drop  1 drop Both Eyes QHS Wendee Beavers T, MD   1 drop at 11/25/20 2135   levothyroxine (SYNTHROID) tablet 88 mcg  88 mcg Oral QHS Chotiner, Yevonne Aline, MD   88 mcg at 11/25/20 2132   memantine (NAMENDA) tablet 10 mg  10 mg Oral BID Wendee Beavers T, MD   10 mg at 11/26/20 0945   metoprolol tartrate (LOPRESSOR) tablet 12.5 mg  12.5 mg Oral BID Wendee Beavers T, MD   12.5 mg at 11/26/20 7169   multivitamin with minerals tablet 1 tablet  1 tablet Oral Daily Wendee Beavers T, MD   1 tablet at 11/26/20 0943   Netarsudil Dimesylate 0.02 % SOLN 1 drop  1 drop Both Eyes QHS Dang, Thuy D, RPH       ondansetron (ZOFRAN) tablet 4 mg  4 mg Oral Q6H PRN Chotiner, Yevonne Aline, MD       Or   ondansetron (ZOFRAN) injection 4 mg  4 mg Intravenous Q6H PRN Chotiner, Yevonne Aline, MD       pioglitazone (ACTOS) tablet 15 mg  15 mg Oral QAC breakfast  Chotiner, Yevonne Aline, MD   15 mg at 11/26/20 0835   polyethylene glycol (MIRALAX / GLYCOLAX) packet 17 g  17 g Oral Daily Pahwani, Rinka R, MD   17 g at 11/26/20 1117   tamsulosin (FLOMAX) capsule 0.4 mg  0.4 mg Oral BID Chotiner, Yevonne Aline, MD   0.4 mg at 11/26/20 6789     Discharge Medications: Please see discharge summary for a list of discharge medications.  Relevant Imaging Results:  Relevant Lab Results:   Additional  Information ssn: [id number] covid vaccinated with booster  Emeterio Reeve, LCSWA

## 2020-11-26 NOTE — Progress Notes (Signed)
PROGRESS NOTE    Mike Mack  PJA:[id number] DOB: 1935/10/15 DOA: 11/24/2020 PCP: Jani Gravel, MD   Brief Narrative:  85 year old M with PMH of DM-2, HTN, BPH, CKD-3A, CVA, cognitive decline, HTN, childhood polio with RLE atrophy and weakness and neuropathy presenting with generalized weakness for 2 days, and admitted for AKI with azotemia with Cr to 2.55 from baseline of 1.3 and BUN in 80s.  Patient was started on IV fluids.  Assessment & Plan:  AKI on CKD-3A: Baseline Cr ranges from 1.1-1.4.  Likely prerenal.  He is also on Benicar which might contribute.   -Renal function improved from 2.55-1.63 -Hold home Benicar -Continue IV fluid.  Avoid nephrotoxic medications.   Ambulatory dysfunction/generalized weakness: Patient uses walker at baseline. Now maximum assist with 2 person per therapy.  No new focal neuro symptoms.  Likely from the above. -MRI brain negative for acute findings.  Ventricular prominence could reflect NPH, multiple remote cerebellar, lacunar infarct involving left thalamus. -Continue PT/OT-recommended SNF.   IDDM-2 with hyperglycemia and neuropathy:  -Well-controlled.  A1c 6.5% -Continue Actos, Levemir 16 units twice daily, SSI-renal, Continue NovoLog 3 units 3 times daily with meals -Continue gabapentin  Essential hypertension:  -Continue metoprolol  Cognitive impairment: Seems to be at baseline.  -Reorientation and delirium precautions. -Continue Namenda   Normocytic anemia: Hgb slightly low since admission likely dilutional. However, it trended from 13-8.3 over the last 8 months. -B12, folate, ferritin: WNL, iron slightly lower than the baseline.  Iron saturation: WNL. -No melena.  Monitor H&H closely and transfuse as needed.   History of childhood polio with monoparesis of right leg -PT/OT and supportive care.   BPH -Monitor urine output. -Continue Flomax   Hypothyroidism -Continue home Synthroid  Chronic constipation: -Continue MiraLAX,  will  add Dulcolax suppository  DVT prophylaxis: Heparin Code Status: Full code Family Communication:  None present at bedside.  Plan of care discussed with patient in length and he verbalized understanding and agreed with it. I tried to call patient's spouse with no response.  Disposition Plan: SNF   Consultants:  none  Procedures:  none  Antimicrobials:  none   Status is: Inpatient   Dispo: The patient is from: Home              Anticipated d/c is to: SNF              Patient currently is not medically stable to d/c.   Difficult to place patient No         Subjective: Patient seen and examined.  Appears slightly confused.  Reports that he is doing fine, no new complaints.  As per RN: Patient had not had bowel movement since admission. No fever, chills, no acute events overnight.    Objective: Vitals:   11/25/20 1405 11/25/20 1751 11/25/20 2118 11/26/20 0431  BP: 122/72 129/68 132/62 (!) 168/62  Pulse: (!) 56 63 64 79  Resp: 18 15 16 17   Temp: 98.1 F (36.7 C) 98 F (36.7 C) 98.2 F (36.8 C) 98.2 F (36.8 C)  TempSrc: Oral Oral Oral Oral  SpO2: 100% 99% 100% 98%  Weight:      Height:        Intake/Output Summary (Last 24 hours) at 11/26/2020 1057 Last data filed at 11/26/2020 1023 Gross per 24 hour  Intake 900 ml  Output 1375 ml  Net -475 ml   Filed Weights   11/25/20 0513  Weight: 84.5 kg    Examination:  General exam: Appears  calm and comfortable, on room air, appears confused.  respiratory system: Clear to auscultation. Respiratory effort normal. Cardiovascular system: S1 & S2 heard, RRR. No JVD, murmurs, rubs, gallops or clicks. No pedal edema. Gastrointestinal system: Abdomen is nondistended, soft and nontender. No organomegaly or masses felt. Normal bowel sounds heard. Central nervous system: Alert and oriented to place only.  Atrophy and weakness of right lower extremity. Skin: No rashes, lesions or ulcers Psychiatry: Judgement and insight  appear normal.    Data Reviewed: I have personally reviewed following labs and imaging studies  CBC: Recent Labs  Lab 11/24/20 1145 11/25/20 0553 11/26/20 0146  WBC 7.2 7.1 9.1  NEUTROABS 5.0  --   --   HGB 9.1* 8.3* 8.5*  HCT 29.0* 25.6* 26.1*  MCV 99.7 98.5 98.1  PLT 225 230 829   Basic Metabolic Panel: Recent Labs  Lab 11/24/20 1145 11/25/20 0553 11/26/20 0146  NA 137 139 139  K 4.8 4.4 4.5  CL 109 109 111  CO2 21* 21* 24  GLUCOSE 247* 217* 131*  BUN 82* 71* 50*  CREATININE 2.55* 2.08* 1.63*  CALCIUM 8.5* 7.9* 8.6*  MG  --   --  2.6*  PHOS  --   --  2.2*   GFR: Estimated Creatinine Clearance: 34.9 mL/min (A) (by C-G formula based on SCr of 1.63 mg/dL (H)). Liver Function Tests: Recent Labs  Lab 11/24/20 1145 11/26/20 0146  AST 18  --   ALT 19  --   ALKPHOS 109  --   BILITOT 0.1*  --   PROT 6.9  --   ALBUMIN 2.9* 2.6*   Recent Labs  Lab 11/24/20 1145  LIPASE 46   No results for input(s): AMMONIA in the last 168 hours. Coagulation Profile: No results for input(s): INR, PROTIME in the last 168 hours. Cardiac Enzymes: No results for input(s): CKTOTAL, CKMB, CKMBINDEX, TROPONINI in the last 168 hours. BNP (last 3 results) No results for input(s): PROBNP in the last 8760 hours. HbA1C: Recent Labs    11/25/20 1537  HGBA1C 6.5*   CBG: Recent Labs  Lab 11/24/20 1138 11/25/20 1722 11/25/20 2119 11/26/20 0816  GLUCAP 238* 140* 81 118*   Lipid Profile: No results for input(s): CHOL, HDL, LDLCALC, TRIG, CHOLHDL, LDLDIRECT in the last 72 hours. Thyroid Function Tests: No results for input(s): TSH, T4TOTAL, FREET4, T3FREE, THYROIDAB in the last 72 hours. Anemia Panel: Recent Labs    11/25/20 0553  VITAMINB12 464  FOLATE 35.5  FERRITIN 65  TIBC 234*  IRON 44*  RETICCTPCT 1.4   Sepsis Labs: No results for input(s): PROCALCITON, LATICACIDVEN in the last 168 hours.  Recent Results (from the past 240 hour(s))  Resp Panel by RT-PCR (Flu  A&B, Covid) Nasopharyngeal Swab     Status: None   Collection Time: 11/25/20 12:56 AM   Specimen: Nasopharyngeal Swab; Nasopharyngeal(NP) swabs in vial transport medium  Result Value Ref Range Status   SARS Coronavirus 2 by RT PCR NEGATIVE NEGATIVE Final    Comment: (NOTE) SARS-CoV-2 target nucleic acids are NOT DETECTED.  The SARS-CoV-2 RNA is generally detectable in upper respiratory specimens during the acute phase of infection. The lowest concentration of SARS-CoV-2 viral copies this assay can detect is 138 copies/mL. A negative result does not preclude SARS-Cov-2 infection and should not be used as the sole basis for treatment or other patient management decisions. A negative result may occur with  improper specimen collection/handling, submission of specimen other than nasopharyngeal swab, presence of viral  mutation(s) within the areas targeted by this assay, and inadequate number of viral copies(<138 copies/mL). A negative result must be combined with clinical observations, patient history, and epidemiological information. The expected result is Negative.  Fact Sheet for Patients:  EntrepreneurPulse.com.au  Fact Sheet for Healthcare Providers:  IncredibleEmployment.be  This test is no t yet approved or cleared by the [place] FDA and  has been authorized for detection and/or diagnosis of SARS-CoV-2 by FDA under an Emergency Use Authorization (EUA). This EUA will remain  in effect (meaning this test can be used) for the duration of the COVID-19 declaration under Section 564(b)(1) of the Act, 21 U.S.C.section 360bbb-3(b)(1), unless the authorization is terminated  or revoked sooner.       Influenza A by PCR NEGATIVE NEGATIVE Final   Influenza B by PCR NEGATIVE NEGATIVE Final    Comment: (NOTE) The Xpert Xpress SARS-CoV-2/FLU/RSV plus assay is intended as an aid in the diagnosis of influenza from Nasopharyngeal swab specimens  and should not be used as a sole basis for treatment. Nasal washings and aspirates are unacceptable for Xpert Xpress SARS-CoV-2/FLU/RSV testing.  Fact Sheet for Patients: EntrepreneurPulse.com.au  Fact Sheet for Healthcare Providers: IncredibleEmployment.be  This test is not yet approved or cleared by the [place] FDA and has been authorized for detection and/or diagnosis of SARS-CoV-2 by FDA under an Emergency Use Authorization (EUA). This EUA will remain in effect (meaning this test can be used) for the duration of the COVID-19 declaration under Section 564(b)(1) of the Act, 21 U.S.C. section 360bbb-3(b)(1), unless the authorization is terminated or revoked.  Performed at [facility], [place] [address] [place], [place] [address]       Radiology Studies: MR BRAIN WO CONTRAST  Result Date: 11/25/2020 CLINICAL DATA:  Initial evaluation for neuro deficit, stroke suspected. EXAM: MRI HEAD WITHOUT CONTRAST TECHNIQUE: Multiplanar, multiecho pulse sequences of the brain and surrounding structures were obtained without intravenous contrast. COMPARISON:  Prior CT from 03/19/2020. FINDINGS: Brain: Generalized age-related cerebral atrophy. Patchy and confluent T2/FLAIR hyperintensity seen involving the periventricular, deep, and subcortical white matter both cerebral hemispheres as well as the midbrain and pons, most like related chronic microvascular ischemic disease, moderate in nature. Remote lacunar infarct present at the left thalamus. Multiple scattered remote bilateral cerebellar infarcts noted. No abnormal foci of restricted diffusion to suggest acute or subacute ischemia. Gray-white matter differentiation maintained. No other areas of encephalomalacia to suggest chronic cortical infarction. No foci of susceptibility artifact to suggest acute or chronic intracranial hemorrhage. No mass lesion, midline shift or mass effect. Ventricular prominence  somewhat out of proportion to cortical sulcation, which could reflect a degree of NPH. No extra-axial fluid collection. Pituitary gland within normal limits. Midline structures intact. Vascular: Major intracranial vascular flow voids are maintained. Skull and upper cervical spine: Degenerative changes noted about the C1-2 articulation with thickening of the tectorial membrane. Craniocervical junction otherwise unremarkable. Trace degenerative anterolisthesis of C4 on C5 noted. Diffusely decreased T1 signal intensity seen within the visualized bone marrow, nonspecific, but most commonly related to anemia, smoking, or obesity. No focal marrow replacing lesion. No scalp soft tissue abnormality. Sinuses/Orbits: Patient status post bilateral ocular lens replacement. Globes and orbital soft tissues demonstrate no acute finding. Paranasal sinuses are largely clear. Moderate bilateral mastoid effusions, right worse than left. Visualized nasopharynx within normal limits. Other: None. IMPRESSION: 1. No acute intracranial abnormality. 2. Ventricular prominence somewhat out of proportion to cortical sulcation, which could reflect a degree of NPH. 3. Multiple  remote bilateral cerebellar infarcts, with additional remote lacunar infarct involving the left thalamus. 4. Moderate chronic microvascular ischemic disease. 5. Moderate bilateral mastoid effusions, right worse than left. Electronically Signed   By: Jeannine Boga M.D.   On: 11/25/2020 00:36    Scheduled Meds:  aspirin EC  81 mg Oral Daily   atorvastatin  20 mg Oral QPM   brimonidine  1 drop Both Eyes BID   dorzolamide-timolol  1 drop Both Eyes BID   gabapentin  600 mg Oral BID   heparin injection (subcutaneous)  5,000 Units Subcutaneous Q8H   insulin aspart  0-5 Units Subcutaneous QHS   insulin aspart  0-9 Units Subcutaneous TID WC   insulin aspart  3 Units Subcutaneous TID WC   insulin detemir  16 Units Subcutaneous BID   latanoprost  1 drop Both Eyes  QHS   levothyroxine  88 mcg Oral QHS   memantine  10 mg Oral BID   metoprolol tartrate  12.5 mg Oral BID   multivitamin with minerals  1 tablet Oral Daily   Netarsudil Dimesylate  1 drop Both Eyes QHS   pioglitazone  15 mg Oral QAC breakfast   polyethylene glycol  17 g Oral Daily   tamsulosin  0.4 mg Oral BID   Continuous Infusions:  sodium chloride 1,000 mL (11/25/20 0137)   lactated ringers 100 mL/hr at 11/26/20 0945     LOS: 1 day   Time spent: 40 minutes   Chaitra Mast Loann Quill, MD [facility]  If 7PM-7AM, please contact night-coverage [email or web address] 11/26/2020, 10:57 AM

## 2020-11-27 DIAGNOSIS — B91 Sequelae of poliomyelitis: Secondary | ICD-10-CM

## 2020-11-27 DIAGNOSIS — N400 Enlarged prostate without lower urinary tract symptoms: Secondary | ICD-10-CM

## 2020-11-27 DIAGNOSIS — M89662 Osteopathy after poliomyelitis, left lower leg: Secondary | ICD-10-CM

## 2020-11-27 DIAGNOSIS — F0390 Unspecified dementia without behavioral disturbance: Secondary | ICD-10-CM

## 2020-11-27 LAB — CBC
HCT: 26.2 % — ABNORMAL LOW (ref 39.0–52.0)
Hemoglobin: 8.3 g/dL — ABNORMAL LOW (ref 13.0–17.0)
MCH: 32 pg (ref 26.0–34.0)
MCHC: 31.7 g/dL (ref 30.0–36.0)
MCV: 101.2 fL — ABNORMAL HIGH (ref 80.0–100.0)
Platelets: 177 10*3/uL (ref 150–400)
RBC: 2.59 MIL/uL — ABNORMAL LOW (ref 4.22–5.81)
RDW: 14 % (ref 11.5–15.5)
WBC: 9 10*3/uL (ref 4.0–10.5)
nRBC: 0 % (ref 0.0–0.2)

## 2020-11-27 LAB — BASIC METABOLIC PANEL
Anion gap: 10 (ref 5–15)
BUN: 32 mg/dL — ABNORMAL HIGH (ref 8–23)
CO2: 17 mmol/L — ABNORMAL LOW (ref 22–32)
Calcium: 8.2 mg/dL — ABNORMAL LOW (ref 8.9–10.3)
Chloride: 108 mmol/L (ref 98–111)
Creatinine, Ser: 1.47 mg/dL — ABNORMAL HIGH (ref 0.61–1.24)
GFR, Estimated: 47 mL/min — ABNORMAL LOW (ref >60)
Glucose, Bld: 129 mg/dL — ABNORMAL HIGH (ref 70–99)
Potassium: 5.2 mmol/L — ABNORMAL HIGH (ref 3.5–5.1)
Sodium: 135 mmol/L (ref 135–145)

## 2020-11-27 LAB — GLUCOSE, CAPILLARY
Glucose-Capillary: 96 mg/dL (ref 70–99)
Glucose-Capillary: 205 mg/dL — ABNORMAL HIGH (ref 70–99)
Glucose-Capillary: 81 mg/dL (ref 70–99)
Glucose-Capillary: 81 mg/dL (ref 70–99)

## 2020-11-27 MED ORDER — INSULIN DETEMIR 100 UNIT/ML ~~LOC~~ SOLN
16.0000 [IU] | Freq: Two times a day (BID) | SUBCUTANEOUS | Status: DC
  Administered 2020-11-27 (×2): 16 [IU] via SUBCUTANEOUS
  Filled 2020-11-27 (×4): qty 0.16

## 2020-11-27 MED ORDER — METOPROLOL TARTRATE 25 MG PO TABS: 12.5000 mg | ORAL_TABLET | Freq: Two times a day (BID) | ORAL | Status: DC

## 2020-11-27 MED ORDER — ACETAMINOPHEN 325 MG PO TABS: 650.0000 mg | ORAL_TABLET | Freq: Four times a day (QID) | ORAL | Status: AC | PRN

## 2020-11-27 MED ORDER — INSULIN DEGLUDEC 100 UNIT/ML ~~LOC~~ SOPN: 14.0000 [IU] | PEN_INJECTOR | Freq: Every day | SUBCUTANEOUS | Status: DC

## 2020-11-27 NOTE — Discharge Summary (Signed)
Physician Discharge Summary  Mike Mack PPI:[id number] DOB: Apr 05, 1936 DOA: 11/24/2020  PCP: Jani Gravel, MD  Admit date: 11/24/2020 Discharge date: 11/27/2020  Admitted From: home  Disposition:  SNF   Discharge Condition:  stable   CODE STATUS:  full code   Diet recommendation:  heart healthy and diabetic Consultations: none  Procedures/Studies: See below   Discharge Diagnoses:  Principal Problem:   AKI (acute kidney injury) ([place]) Active Problems:   Hypertension   BPH (benign prostatic hyperplasia)   Generalized weakness   S/P cholecystectomy   Monoparesis of leg ([place])   Controlled type 2 diabetes mellitus with stable proliferative retinopathy of both eyes, with long-term current use of insulin (HCC)   Dementia (HCC)   Polio osteopathy of lower leg, left ([place])     Brief Summary:  85 year old M with PMH of DM-2, HTN, BPH, CKD-3A, CVA, cognitive decline, HTN, childhood polio with RLE atrophy and weakness and neuropathy presenting with generalized weakness for 2 days, and admitted for AKI with azotemia with Cr to 2.55 from baseline of 1.3 and BUN in 80s.  Patient was started on IV fluids.  Hospital Course:   AKI on CKD-3A: Baseline Cr ranges from 1.1-1.4.   Likely prerenal and treated with IV fluids -Renal function improved from 2.55-1.47   Ambulatory dysfunction/generalized weakness and left leg affected by Polio Patient uses walker at baseline. Now maximum assist with 2 person per therapy.  No new focal neuro symptoms.  Likely from the above. -MRI brain negative for acute findings.  Ventricular prominence could reflect NPH, multiple remote cerebellar, lacunar infarct involving left thalamus.    IDDM-2 with hyperglycemia and neuropathy:  -Well-controlled.  A1c 6.5% -Continue Actos, Levemir 16 units twice daily, SSI-renal, Continue NovoLog 3 units 3 times daily with meals -Continue gabapentin   Essential hypertension:  -Continue metoprolol   Dementia -Continue  Namenda   Normocytic anemia:  Hb stable at 8-9 range -Anemia panel is consistent with anemia of chronic disease   BPH  -Continue Flomax   Hypothyroidism -Continue home Synthroid   Chronic constipation: -Continue MiraLAX    Discharge Exam: Vitals:   11/26/20 2031 11/27/20 0352  BP: (!) 126/51 (!) 114/58  Pulse: 62 68  Resp: 17 18  Temp: 98.1 F (36.7 C) 98.1 F (36.7 C)  SpO2: 99% 94%   Vitals:   11/26/20 0431 11/26/20 1306 11/26/20 2031 11/27/20 0352  BP: (!) 168/62 126/68 (!) 126/51 (!) 114/58  Pulse: 79 66 62 68  Resp: 17 18 17 18   Temp: 98.2 F (36.8 C) 98.5 F (36.9 C) 98.1 F (36.7 C) 98.1 F (36.7 C)  TempSrc: Oral Oral Oral Oral  SpO2: 98% 100% 99% 94%  Weight:      Height:        General: Pt is alert, awake, not in acute distress Cardiovascular: RRR, S1/S2 +, no rubs, no gallops Respiratory: CTA bilaterally, no wheezing, no rhonchi Abdominal: Soft, NT, ND, bowel sounds + Extremities: no edema, no cyanosis   Discharge Instructions  Discharge Instructions     Diet - low sodium heart healthy   Complete by: As directed    Diet Carb Modified   Complete by: As directed    Increase activity slowly   Complete by: As directed       Allergies as of 11/27/2020       Reactions   Metformin And Related Other (See Comments)   Gi intolerance   Other    Other reaction(s): Other (See Comments) Renal  insufficiency Other reaction(s): GI Upset (intolerance) Gi intolerance   Soliqua [insulin Glargine-lixisenatide] Other (See Comments)   Stomach cramps        Medication List     STOP taking these medications    amLODipine 5 MG tablet Commonly known as: NORVASC   Fiasp FlexTouch 100 UNIT/ML FlexTouch Pen Generic drug: insulin aspart   glucose blood test strip   olmesartan 20 MG tablet Commonly known as: BENICAR   pioglitazone 30 MG tablet Commonly known as: ACTOS       TAKE these medications    acetaminophen 325 MG tablet Commonly  known as: TYLENOL Take 2 tablets (650 mg total) by mouth every 6 (six) hours as needed for mild pain (or Fever >/= 101).   aspirin EC 81 MG tablet Take 81 mg by mouth daily.   atorvastatin 20 MG tablet Commonly known as: LIPITOR Take 20 mg by mouth every evening.   B COMPLEX PO Take 1 tablet by mouth at bedtime.   brimonidine 0.2 % ophthalmic solution Commonly known as: ALPHAGAN Place 1 drop into both eyes 2 (two) times daily.   cholecalciferol 10 MCG (400 UNIT) Tabs tablet Commonly known as: VITAMIN D3 Take 400 Units by mouth daily.   dorzolamide-timolol 22.3-6.8 MG/ML ophthalmic solution Commonly known as: COSOPT Place 1 drop into both eyes 2 (two) times daily.   feeding supplement (GLUCERNA SHAKE) Liqd Take 237 mLs by mouth 3 (three) times daily between meals.   gabapentin 600 MG tablet Commonly known as: NEURONTIN Take 600 mg by mouth 2 (two) times daily.   insulin degludec 100 UNIT/ML FlexTouch Pen Commonly known as: TRESIBA Inject 14 Units into the skin at bedtime. What changed:  how much to take when to take this   latanoprost 0.005 % ophthalmic solution Commonly known as: XALATAN Place 1 drop into both eyes at bedtime.   levothyroxine 88 MCG tablet Commonly known as: SYNTHROID Take 88 mcg by mouth at bedtime.   memantine 10 MG tablet Commonly known as: NAMENDA Take 10 mg by mouth 2 (two) times daily.   metoprolol tartrate 25 MG tablet Commonly known as: LOPRESSOR Take 0.5 tablets (12.5 mg total) by mouth 2 (two) times daily. What changed:  medication strength how much to take Another medication with the same name was removed. Continue taking this medication, and follow the directions you see here.   multivitamin with minerals Tabs tablet Take 1 tablet by mouth daily.   ondansetron 4 MG disintegrating tablet Commonly known as: Zofran ODT 4mg  ODT q4 hours prn nausea/vomit   polyethylene glycol 17 g packet Commonly known as: MIRALAX /  GLYCOLAX Take 17 g by mouth daily.   Rhopressa 0.02 % Soln Generic drug: Netarsudil Dimesylate Place 1 drop into both eyes at bedtime.   tamsulosin 0.4 MG Caps capsule Commonly known as: FLOMAX Take 0.4 mg by mouth in the morning and at bedtime.        Allergies  Allergen Reactions   Metformin And Related Other (See Comments)    Gi intolerance   Other     Other reaction(s): Other (See Comments) Renal insufficiency Other reaction(s): GI Upset (intolerance) Gi intolerance   [name] [Insulin Glargine-Lixisenatide] Other (See Comments)    Stomach cramps      MR BRAIN WO CONTRAST  Result Date: 11/25/2020 CLINICAL DATA:  Initial evaluation for neuro deficit, stroke suspected. EXAM: MRI HEAD WITHOUT CONTRAST TECHNIQUE: Multiplanar, multiecho pulse sequences of the brain and surrounding structures were obtained without intravenous contrast. COMPARISON:  Prior CT from 03/19/2020. FINDINGS: Brain: Generalized age-related cerebral atrophy. Patchy and confluent T2/FLAIR hyperintensity seen involving the periventricular, deep, and subcortical white matter both cerebral hemispheres as well as the midbrain and pons, most like related chronic microvascular ischemic disease, moderate in nature. Remote lacunar infarct present at the left thalamus. Multiple scattered remote bilateral cerebellar infarcts noted. No abnormal foci of restricted diffusion to suggest acute or subacute ischemia. Gray-white matter differentiation maintained. No other areas of encephalomalacia to suggest chronic cortical infarction. No foci of susceptibility artifact to suggest acute or chronic intracranial hemorrhage. No mass lesion, midline shift or mass effect. Ventricular prominence somewhat out of proportion to cortical sulcation, which could reflect a degree of NPH. No extra-axial fluid collection. Pituitary gland within normal limits. Midline structures intact. Vascular: Major intracranial vascular flow voids are  maintained. Skull and upper cervical spine: Degenerative changes noted about the C1-2 articulation with thickening of the tectorial membrane. Craniocervical junction otherwise unremarkable. Trace degenerative anterolisthesis of C4 on C5 noted. Diffusely decreased T1 signal intensity seen within the visualized bone marrow, nonspecific, but most commonly related to anemia, smoking, or obesity. No focal marrow replacing lesion. No scalp soft tissue abnormality. Sinuses/Orbits: Patient status post bilateral ocular lens replacement. Globes and orbital soft tissues demonstrate no acute finding. Paranasal sinuses are largely clear. Moderate bilateral mastoid effusions, right worse than left. Visualized nasopharynx within normal limits. Other: None. IMPRESSION: 1. No acute intracranial abnormality. 2. Ventricular prominence somewhat out of proportion to cortical sulcation, which could reflect a degree of NPH. 3. Multiple remote bilateral cerebellar infarcts, with additional remote lacunar infarct involving the left thalamus. 4. Moderate chronic microvascular ischemic disease. 5. Moderate bilateral mastoid effusions, right worse than left. Electronically Signed   By: Jeannine Boga M.D.   On: 11/25/2020 00:36     The results of significant diagnostics from this hospitalization (including imaging, microbiology, ancillary and laboratory) are listed below for reference.     Microbiology: Recent Results (from the past 240 hour(s))  Resp Panel by RT-PCR (Flu A&B, Covid) Nasopharyngeal Swab     Status: None   Collection Time: 11/25/20 12:56 AM   Specimen: Nasopharyngeal Swab; Nasopharyngeal(NP) swabs in vial transport medium  Result Value Ref Range Status   SARS Coronavirus 2 by RT PCR NEGATIVE NEGATIVE Final    Comment: (NOTE) SARS-CoV-2 target nucleic acids are NOT DETECTED.  The SARS-CoV-2 RNA is generally detectable in upper respiratory specimens during the acute phase of infection. The  lowest concentration of SARS-CoV-2 viral copies this assay can detect is 138 copies/mL. A negative result does not preclude SARS-Cov-2 infection and should not be used as the sole basis for treatment or other patient management decisions. A negative result may occur with  improper specimen collection/handling, submission of specimen other than nasopharyngeal swab, presence of viral mutation(s) within the areas targeted by this assay, and inadequate number of viral copies(<138 copies/mL). A negative result must be combined with clinical observations, patient history, and epidemiological information. The expected result is Negative.  Fact Sheet for Patients:  EntrepreneurPulse.com.au  Fact Sheet for Healthcare Providers:  IncredibleEmployment.be  This test is no t yet approved or cleared by the [place] FDA and  has been authorized for detection and/or diagnosis of SARS-CoV-2 by FDA under an Emergency Use Authorization (EUA). This EUA will remain  in effect (meaning this test can be used) for the duration of the COVID-19 declaration under Section 564(b)(1) of the Act, 21 U.S.C.section 360bbb-3(b)(1), unless the authorization is terminated  or  revoked sooner.       Influenza A by PCR NEGATIVE NEGATIVE Final   Influenza B by PCR NEGATIVE NEGATIVE Final    Comment: (NOTE) The Xpert Xpress SARS-CoV-2/FLU/RSV plus assay is intended as an aid in the diagnosis of influenza from Nasopharyngeal swab specimens and should not be used as a sole basis for treatment. Nasal washings and aspirates are unacceptable for Xpert Xpress SARS-CoV-2/FLU/RSV testing.  Fact Sheet for Patients: EntrepreneurPulse.com.au  Fact Sheet for Healthcare Providers: IncredibleEmployment.be  This test is not yet approved or cleared by the [place] FDA and has been authorized for detection and/or diagnosis of SARS-CoV-2 by FDA under  an Emergency Use Authorization (EUA). This EUA will remain in effect (meaning this test can be used) for the duration of the COVID-19 declaration under Section 564(b)(1) of the Act, 21 U.S.C. section 360bbb-3(b)(1), unless the authorization is terminated or revoked.  Performed at [facility], [place] [address], [place], [place] [address]      Labs: BNP (last 3 results) No results for input(s): BNP in the last 8760 hours. Basic Metabolic Panel: Recent Labs  Lab 11/24/20 1145 11/25/20 0553 11/26/20 0146 11/27/20 0043  NA 137 139 139 135  K 4.8 4.4 4.5 5.2*  CL 109 109 111 108  CO2 21* 21* 24 17*  GLUCOSE 247* 217* 131* 129*  BUN 82* 71* 50* 32*  CREATININE 2.55* 2.08* 1.63* 1.47*  CALCIUM 8.5* 7.9* 8.6* 8.2*  MG  --   --  2.6*  --   PHOS  --   --  2.2*  --    Liver Function Tests: Recent Labs  Lab 11/24/20 1145 11/26/20 0146  AST 18  --   ALT 19  --   ALKPHOS 109  --   BILITOT 0.1*  --   PROT 6.9  --   ALBUMIN 2.9* 2.6*   Recent Labs  Lab 11/24/20 1145  LIPASE 46   No results for input(s): AMMONIA in the last 168 hours. CBC: Recent Labs  Lab 11/24/20 1145 11/25/20 0553 11/26/20 0146 11/27/20 0043  WBC 7.2 7.1 9.1 9.0  NEUTROABS 5.0  --   --   --   HGB 9.1* 8.3* 8.5* 8.3*  HCT 29.0* 25.6* 26.1* 26.2*  MCV 99.7 98.5 98.1 101.2*  PLT 225 230 234 177   Cardiac Enzymes: No results for input(s): CKTOTAL, CKMB, CKMBINDEX, TROPONINI in the last 168 hours. BNP: Invalid input(s): POCBNP CBG: Recent Labs  Lab 11/26/20 1700 11/26/20 1728 11/26/20 2034 11/27/20 0748 11/27/20 1140  GLUCAP 60* 70 143* 96 205*   D-Dimer No results for input(s): DDIMER in the last 72 hours. Hgb A1c Recent Labs    11/25/20 1537  HGBA1C 6.5*   Lipid Profile No results for input(s): CHOL, HDL, LDLCALC, TRIG, CHOLHDL, LDLDIRECT in the last 72 hours. Thyroid function studies No results for input(s): TSH, T4TOTAL, T3FREE, THYROIDAB in the last 72 hours.  Invalid  input(s): FREET3 Anemia work up Recent Labs    11/25/20 0553  VITAMINB12 464  FOLATE 35.5  FERRITIN 65  TIBC 234*  IRON 44*  RETICCTPCT 1.4   Urinalysis    Component Value Date/Time   COLORURINE YELLOW 11/24/2020 2200   APPEARANCEUR HAZY (A) 11/24/2020 2200   LABSPEC 1.018 11/24/2020 2200   PHURINE 5.0 11/24/2020 2200   GLUCOSEU NEGATIVE 11/24/2020 2200   HGBUR SMALL (A) 11/24/2020 2200   BILIRUBINUR NEGATIVE 11/24/2020 [place] 11/24/2020 2200   PROTEINUR NEGATIVE 11/24/2020 2200  UROBILINOGEN 0.2 10/25/2014 1520   NITRITE NEGATIVE 11/24/2020 2200   LEUKOCYTESUR SMALL (A) 11/24/2020 2200   Sepsis Labs Invalid input(s): PROCALCITONIN,  WBC,  LACTICIDVEN Microbiology Recent Results (from the past 240 hour(s))  Resp Panel by RT-PCR (Flu A&B, Covid) Nasopharyngeal Swab     Status: None   Collection Time: 11/25/20 12:56 AM   Specimen: Nasopharyngeal Swab; Nasopharyngeal(NP) swabs in vial transport medium  Result Value Ref Range Status   SARS Coronavirus 2 by RT PCR NEGATIVE NEGATIVE Final    Comment: (NOTE) SARS-CoV-2 target nucleic acids are NOT DETECTED.  The SARS-CoV-2 RNA is generally detectable in upper respiratory specimens during the acute phase of infection. The lowest concentration of SARS-CoV-2 viral copies this assay can detect is 138 copies/mL. A negative result does not preclude SARS-Cov-2 infection and should not be used as the sole basis for treatment or other patient management decisions. A negative result may occur with  improper specimen collection/handling, submission of specimen other than nasopharyngeal swab, presence of viral mutation(s) within the areas targeted by this assay, and inadequate number of viral copies(<138 copies/mL). A negative result must be combined with clinical observations, patient history, and epidemiological information. The expected result is Negative.  Fact Sheet for Patients:   EntrepreneurPulse.com.au  Fact Sheet for Healthcare Providers:  IncredibleEmployment.be  This test is no t yet approved or cleared by the [place] FDA and  has been authorized for detection and/or diagnosis of SARS-CoV-2 by FDA under an Emergency Use Authorization (EUA). This EUA will remain  in effect (meaning this test can be used) for the duration of the COVID-19 declaration under Section 564(b)(1) of the Act, 21 U.S.C.section 360bbb-3(b)(1), unless the authorization is terminated  or revoked sooner.       Influenza A by PCR NEGATIVE NEGATIVE Final   Influenza B by PCR NEGATIVE NEGATIVE Final    Comment: (NOTE) The Xpert Xpress SARS-CoV-2/FLU/RSV plus assay is intended as an aid in the diagnosis of influenza from Nasopharyngeal swab specimens and should not be used as a sole basis for treatment. Nasal washings and aspirates are unacceptable for Xpert Xpress SARS-CoV-2/FLU/RSV testing.  Fact Sheet for Patients: EntrepreneurPulse.com.au  Fact Sheet for Healthcare Providers: IncredibleEmployment.be  This test is not yet approved or cleared by the [place] FDA and has been authorized for detection and/or diagnosis of SARS-CoV-2 by FDA under an Emergency Use Authorization (EUA). This EUA will remain in effect (meaning this test can be used) for the duration of the COVID-19 declaration under Section 564(b)(1) of the Act, 21 U.S.C. section 360bbb-3(b)(1), unless the authorization is terminated or revoked.  Performed at [facility], [place] [address], [place], [place] [address]      Time coordinating discharge in minutes: 65  SIGNED:   Debbe Odea, MD  [facility] 11/27/2020, 2:38 PM

## 2020-11-27 NOTE — TOC Progression Note (Addendum)
Transition of Care (TOC) - Progression Note    Patient Details  Name: Khing Brix MRN: [id number] Date of Birth: 10/11/1935  Transition of Care (TOC) CM/SW Contact  ,  Jon, LCSW Phone Number: 11/27/2020, 11:51 AM  Clinical Narrative:   CSW met with pt and wife Dorothy in pt room to present bed offers.  Wife is not interested in any of the current offers: [facility], [facility], [facility].  CSW reviewed the list of SNFs that pt referral has been sent to.  Wife is interested in [facility] and [facility].  CSW LM with both facilities as they have not yet responded to the referral in the hub.    1530: CSW spoke with [facility] and they do not manage this [employer] policy.  Rep is verifying this and will call back to confirm.  Auth done by the facility.     Expected Discharge Plan: Skilled Nursing Facility Barriers to Discharge: Continued Medical Work up  Expected Discharge Plan and Services Expected Discharge Plan: Skilled Nursing Facility       Living arrangements for the past 2 months: Single Family Home                                       Social Determinants of Health (SDOH) Interventions    Readmission Risk Interventions No flowsheet data found.  

## 2020-11-28 DIAGNOSIS — Z794 Long term (current) use of insulin: Secondary | ICD-10-CM

## 2020-11-28 DIAGNOSIS — E113553 Type 2 diabetes mellitus with stable proliferative diabetic retinopathy, bilateral: Secondary | ICD-10-CM

## 2020-11-28 LAB — GLUCOSE, CAPILLARY
Glucose-Capillary: 41 mg/dL — CL (ref 70–99)
Glucose-Capillary: 102 mg/dL — ABNORMAL HIGH (ref 70–99)
Glucose-Capillary: 55 mg/dL — ABNORMAL LOW (ref 70–99)
Glucose-Capillary: 153 mg/dL — ABNORMAL HIGH (ref 70–99)
Glucose-Capillary: 123 mg/dL — ABNORMAL HIGH (ref 70–99)
Glucose-Capillary: 100 mg/dL — ABNORMAL HIGH (ref 70–99)

## 2020-11-28 LAB — BASIC METABOLIC PANEL
Anion gap: 4 — ABNORMAL LOW (ref 5–15)
BUN: 22 mg/dL (ref 8–23)
CO2: 24 mmol/L (ref 22–32)
Calcium: 8.2 mg/dL — ABNORMAL LOW (ref 8.9–10.3)
Chloride: 106 mmol/L (ref 98–111)
Creatinine, Ser: 1.44 mg/dL — ABNORMAL HIGH (ref 0.61–1.24)
GFR, Estimated: 49 mL/min — ABNORMAL LOW (ref >60)
Glucose, Bld: 127 mg/dL — ABNORMAL HIGH (ref 70–99)
Potassium: 4.7 mmol/L (ref 3.5–5.1)
Sodium: 134 mmol/L — ABNORMAL LOW (ref 135–145)

## 2020-11-28 NOTE — Plan of Care (Signed)

## 2020-11-28 NOTE — Progress Notes (Signed)
PROGRESS NOTE    Mike Mack   FAO:[id number]  DOB: [birth year]  DOA: 11/24/2020 PCP: Jani Gravel, MD   Brief Narrative:  Mike Williams55 year old M with PMH of DM-2, HTN, BPH, CKD-3A, CVA, cognitive decline, HTN, childhood polio with RLE atrophy and weakness and neuropathy presenting with generalized weakness for 2 days, and admitted for AKI with azotemia with Cr to 2.55 from baseline of 1.3 and BUN in 80s.  Patient was started on IV fluids.   Subjective: He has no complaints today.     Assessment & Plan:   Hospital Course:   AKI on CKD-3A: Baseline Cr ranges from 1.1-1.4.   Likely prerenal and treated with IV fluids -Renal function improved from 2.55-1.44   Ambulatory dysfunction/generalized weakness and left leg affected by Polio Patient uses walker at baseline. Now maximum assist with 2 person per therapy.    -MRI brain negative for acute findings.  Ventricular prominence could reflect NPH, multiple remote cerebellar, lacunar infarct involving left thalamus. - awaiting SNF     IDDM-2 with hyperglycemia and neuropathy:  -Well-controlled.  A1c 6.5% -Actos d/c'd on 6/11 - Hypoglycemic today- Levemir will be reduced from 16 U BID to 20 Q QHS today  - have stopped NovoLog 3 units 3 times daily with meals and cont SSI insulin today -Continue gabapentin   Essential hypertension:  -Continue metoprolol   Dementia -Continue Namenda- oriented only to person- no behavioral issues   Anemia:  Hb stable at 8-9 range -Anemia panel is consistent with anemia of chronic disease   BPH -Continue Flomax   Hypothyroidism -Continue home Synthroid   Chronic constipation: -Continue MiraLAX       Time spent in minutes: 30 DVT prophylaxis: heparin injection 5,000 Units Start: 11/25/20 0600  Code Status: Full code Family Communication:  Level of Care: Level of care: Med-Surg Disposition Plan:  Status is: Inpatient  Remains inpatient appropriate because:Unsafe d/c plan and  Inpatient level of care appropriate due to severity of illness  Dispo: The patient is from: Home              Anticipated d/c is to: SNF              Patient currently is not medically stable to d/c.   Difficult to place patient No      Consultants:  none Procedures:    Antimicrobials:  Anti-infectives (From admission, onward)    None        Objective: Vitals:   11/27/20 1509 11/27/20 2052 11/28/20 0340 11/28/20 1417  BP: (!) 135/56 (!) 157/66 (!) 144/54 (!) 160/73  Pulse: 70 80 76 90  Resp: 20 17 18 18   Temp: 99.7 F (37.6 C) 99.3 F (37.4 C) 99 F (37.2 C) 98.8 F (37.1 C)  TempSrc: Oral Oral Oral Axillary  SpO2: 99% 97% 96% 98%  Weight:      Height:        Intake/Output Summary (Last 24 hours) at 11/28/2020 1723 Last data filed at 11/28/2020 1429 Gross per 24 hour  Intake 240 ml  Output 1700 ml  Net -1460 ml   Filed Weights   11/25/20 0513  Weight: 84.5 kg    Examination: General exam: Appears comfortable  HEENT: PERRLA, oral mucosa moist, no sclera icterus or thrush Respiratory system: Clear to auscultation. Respiratory effort normal. Cardiovascular system: S1 & S2 heard, regular rate and rhythm Gastrointestinal system: Abdomen soft, non-tender, nondistended. Normal bowel sounds   Central nervous system: Alert and oriented x 1 (to  person) No focal neurological deficits. Extremities: No cyanosis, clubbing or edema Skin: No rashes or ulcers Psychiatry:  Mood & affect appropriate.      Data Reviewed: I have personally reviewed following labs and imaging studies  CBC: Recent Labs  Lab 11/24/20 1145 11/25/20 0553 11/26/20 0146 11/27/20 0043  WBC 7.2 7.1 9.1 9.0  NEUTROABS 5.0  --   --   --   HGB 9.1* 8.3* 8.5* 8.3*  HCT 29.0* 25.6* 26.1* 26.2*  MCV 99.7 98.5 98.1 101.2*  PLT 225 230 234 177   Basic Metabolic Panel: Recent Labs  Lab 11/24/20 1145 11/25/20 0553 11/26/20 0146 11/27/20 0043 11/28/20 1503  NA 137 139 139 135 134*  K  4.8 4.4 4.5 5.2* 4.7  CL 109 109 111 108 106  CO2 21* 21* 24 17* 24  GLUCOSE 247* 217* 131* 129* 127*  BUN 82* 71* 50* 32* 22  CREATININE 2.55* 2.08* 1.63* 1.47* 1.44*  CALCIUM 8.5* 7.9* 8.6* 8.2* 8.2*  MG  --   --  2.6*  --   --   PHOS  --   --  2.2*  --   --    GFR: Estimated Creatinine Clearance: 39.6 mL/min (A) (by C-G formula based on SCr of 1.44 mg/dL (H)). Liver Function Tests: Recent Labs  Lab 11/24/20 1145 11/26/20 0146  AST 18  --   ALT 19  --   ALKPHOS 109  --   BILITOT 0.1*  --   PROT 6.9  --   ALBUMIN 2.9* 2.6*   Recent Labs  Lab 11/24/20 1145  LIPASE 46   No results for input(s): AMMONIA in the last 168 hours. Coagulation Profile: No results for input(s): INR, PROTIME in the last 168 hours. Cardiac Enzymes: No results for input(s): CKTOTAL, CKMB, CKMBINDEX, TROPONINI in the last 168 hours. BNP (last 3 results) No results for input(s): PROBNP in the last 8760 hours. HbA1C: No results for input(s): HGBA1C in the last 72 hours. CBG: Recent Labs  Lab 11/28/20 0715 11/28/20 0741 11/28/20 0759 11/28/20 1136 11/28/20 1648  GLUCAP 41* 55* 102* 153* 123*   Lipid Profile: No results for input(s): CHOL, HDL, LDLCALC, TRIG, CHOLHDL, LDLDIRECT in the last 72 hours. Thyroid Function Tests: No results for input(s): TSH, T4TOTAL, FREET4, T3FREE, THYROIDAB in the last 72 hours. Anemia Panel: No results for input(s): VITAMINB12, FOLATE, FERRITIN, TIBC, IRON, RETICCTPCT in the last 72 hours. Urine analysis:    Component Value Date/Time   COLORURINE YELLOW 11/24/2020 2200   APPEARANCEUR HAZY (A) 11/24/2020 2200   LABSPEC 1.018 11/24/2020 2200   PHURINE 5.0 11/24/2020 2200   GLUCOSEU NEGATIVE 11/24/2020 2200   HGBUR SMALL (A) 11/24/2020 2200   BILIRUBINUR NEGATIVE 11/24/2020 2200   KETONESUR NEGATIVE 11/24/2020 2200   PROTEINUR NEGATIVE 11/24/2020 2200   UROBILINOGEN 0.2 10/25/2014 1520   NITRITE NEGATIVE 11/24/2020 2200   LEUKOCYTESUR SMALL (A) 11/24/2020  2200   Sepsis Labs: @LABRCNTIP (procalcitonin:4,lacticidven:4) ) Recent Results (from the past 240 hour(s))  Resp Panel by RT-PCR (Flu A&B, Covid) Nasopharyngeal Swab     Status: None   Collection Time: 11/25/20 12:56 AM   Specimen: Nasopharyngeal Swab; Nasopharyngeal(NP) swabs in vial transport medium  Result Value Ref Range Status   SARS Coronavirus 2 by RT PCR NEGATIVE NEGATIVE Final    Comment: (NOTE) SARS-CoV-2 target nucleic acids are NOT DETECTED.  The SARS-CoV-2 RNA is generally detectable in upper respiratory specimens during the acute phase of infection. The lowest concentration of SARS-CoV-2 viral copies this  assay can detect is 138 copies/mL. A negative result does not preclude SARS-Cov-2 infection and should not be used as the sole basis for treatment or other patient management decisions. A negative result may occur with  improper specimen collection/handling, submission of specimen other than nasopharyngeal swab, presence of viral mutation(s) within the areas targeted by this assay, and inadequate number of viral copies(<138 copies/mL). A negative result must be combined with clinical observations, patient history, and epidemiological information. The expected result is Negative.  Fact Sheet for Patients:  EntrepreneurPulse.com.au  Fact Sheet for Healthcare Providers:  IncredibleEmployment.be  This test is no t yet approved or cleared by the [place] FDA and  has been authorized for detection and/or diagnosis of SARS-CoV-2 by FDA under an Emergency Use Authorization (EUA). This EUA will remain  in effect (meaning this test can be used) for the duration of the COVID-19 declaration under Section 564(b)(1) of the Act, 21 U.S.C.section 360bbb-3(b)(1), unless the authorization is terminated  or revoked sooner.       Influenza A by PCR NEGATIVE NEGATIVE Final   Influenza B by PCR NEGATIVE NEGATIVE Final    Comment:  (NOTE) The Xpert Xpress SARS-CoV-2/FLU/RSV plus assay is intended as an aid in the diagnosis of influenza from Nasopharyngeal swab specimens and should not be used as a sole basis for treatment. Nasal washings and aspirates are unacceptable for Xpert Xpress SARS-CoV-2/FLU/RSV testing.  Fact Sheet for Patients: EntrepreneurPulse.com.au  Fact Sheet for Healthcare Providers: IncredibleEmployment.be  This test is not yet approved or cleared by the [place] FDA and has been authorized for detection and/or diagnosis of SARS-CoV-2 by FDA under an Emergency Use Authorization (EUA). This EUA will remain in effect (meaning this test can be used) for the duration of the COVID-19 declaration under Section 564(b)(1) of the Act, 21 U.S.C. section 360bbb-3(b)(1), unless the authorization is terminated or revoked.  Performed at [facility], [place] [address], [place], [place] [address]          Radiology Studies: No results found.    Scheduled Meds:  aspirin EC  81 mg Oral Daily   atorvastatin  20 mg Oral QPM   brimonidine  1 drop Both Eyes BID   dorzolamide-timolol  1 drop Both Eyes BID   gabapentin  600 mg Oral BID   heparin injection (subcutaneous)  5,000 Units Subcutaneous Q8H   insulin aspart  0-5 Units Subcutaneous QHS   insulin aspart  0-9 Units Subcutaneous TID WC   latanoprost  1 drop Both Eyes QHS   levothyroxine  88 mcg Oral QHS   memantine  10 mg Oral BID   metoprolol tartrate  12.5 mg Oral BID   multivitamin with minerals  1 tablet Oral Daily   Netarsudil Dimesylate  1 drop Both Eyes QHS   polyethylene glycol  17 g Oral Daily   tamsulosin  0.4 mg Oral BID   Continuous Infusions:   LOS: 3 days      Debbe Odea, MD [facility] Pager: [email or web address] 11/28/2020, 5:23 PM

## 2020-11-28 NOTE — Progress Notes (Signed)
Hypoglycemic Event  CBG: 41  Treatment: 4oz juice x2  Symptoms: shaking/tremors  Follow-up CBG Time: 0741 / 0759 CBG Result: 55 / 102  Possible Reasons for Event: Unknown  Comments/MD notified: Debbe Odea, MD notified    Racheal Patches, RN

## 2020-11-29 DIAGNOSIS — D62 Acute posthemorrhagic anemia: Secondary | ICD-10-CM

## 2020-11-29 DIAGNOSIS — K921 Melena: Secondary | ICD-10-CM

## 2020-11-29 LAB — GLUCOSE, CAPILLARY
Glucose-Capillary: 119 mg/dL — ABNORMAL HIGH (ref 70–99)
Glucose-Capillary: 187 mg/dL — ABNORMAL HIGH (ref 70–99)
Glucose-Capillary: 189 mg/dL — ABNORMAL HIGH (ref 70–99)
Glucose-Capillary: 177 mg/dL — ABNORMAL HIGH (ref 70–99)

## 2020-11-29 LAB — CBC
HCT: 21.8 % — ABNORMAL LOW (ref 39.0–52.0)
HCT: 25.6 % — ABNORMAL LOW (ref 39.0–52.0)
Hemoglobin: 7.3 g/dL — ABNORMAL LOW (ref 13.0–17.0)
Hemoglobin: 8.5 g/dL — ABNORMAL LOW (ref 13.0–17.0)
MCH: 32.3 pg (ref 26.0–34.0)
MCH: 32.1 pg (ref 26.0–34.0)
MCHC: 33.5 g/dL (ref 30.0–36.0)
MCHC: 33.2 g/dL (ref 30.0–36.0)
MCV: 96.5 fL (ref 80.0–100.0)
MCV: 96.6 fL (ref 80.0–100.0)
Platelets: 257 10*3/uL (ref 150–400)
Platelets: 281 10*3/uL (ref 150–400)
RBC: 2.26 MIL/uL — ABNORMAL LOW (ref 4.22–5.81)
RBC: 2.65 MIL/uL — ABNORMAL LOW (ref 4.22–5.81)
RDW: 13.9 % (ref 11.5–15.5)
RDW: 13.8 % (ref 11.5–15.5)
WBC: 10.6 10*3/uL — ABNORMAL HIGH (ref 4.0–10.5)
WBC: 12 10*3/uL — ABNORMAL HIGH (ref 4.0–10.5)
nRBC: 0.2 % (ref 0.0–0.2)
nRBC: 0.2 % (ref 0.0–0.2)

## 2020-11-29 LAB — OCCULT BLOOD X 1 CARD TO LAB, STOOL: Fecal Occult Bld: POSITIVE — AB

## 2020-11-29 LAB — PREPARE RBC (CROSSMATCH)

## 2020-11-29 MED ORDER — PANTOPRAZOLE 80MG IVPB - SIMPLE MED
80.0000 mg | Freq: Once | INTRAVENOUS | Status: DC
  Filled 2020-11-29 (×4): qty 100

## 2020-11-29 MED ORDER — PANTOPRAZOLE SODIUM 40 MG IV SOLR: 40.0000 mg | Freq: Two times a day (BID) | INTRAVENOUS | Status: DC

## 2020-11-29 MED ORDER — BISACODYL 10 MG RE SUPP
10.0000 mg | RECTAL | Status: AC
  Administered 2020-11-29: 10 mg via RECTAL
  Filled 2020-11-29: qty 1

## 2020-11-29 MED ORDER — SODIUM CHLORIDE 0.9 % IV SOLN: INTRAVENOUS | Status: DC

## 2020-11-29 MED ORDER — SODIUM CHLORIDE 0.9 % IV SOLN
8.0000 mg/h | INTRAVENOUS | Status: DC
  Administered 2020-11-29 – 2020-11-30 (×2): 8 mg/h via INTRAVENOUS
  Filled 2020-11-29 (×3): qty 80

## 2020-11-29 MED ORDER — SODIUM CHLORIDE 0.9% IV SOLUTION: Freq: Once | INTRAVENOUS | Status: AC

## 2020-11-29 NOTE — Progress Notes (Addendum)
PROGRESS NOTE    Mike Mack   JYN:[id number]  DOB: 14-Jan-1936  DOA: 11/24/2020 PCP: Jani Gravel, MD   Brief Narrative:  Md Williams44 year old M with PMH of DM-2, HTN, BPH, CKD-3A, CVA, cognitive decline, HTN, childhood polio with RLE atrophy and weakness and neuropathy presenting with generalized weakness for 2 days, and admitted for AKI with azotemia with Cr to 2.55 from baseline of 1.3 and BUN in 80s.  Patient was started on IV fluids.   Subjective: He has no complaints.     Assessment & Plan:   Hospital Course:  Principle problem:  AKI on CKD-3A: Baseline Cr ranges from 1.1-1.4.   Likely prerenal and treated with IV fluids -Renal function improved from 2.55- 1.44    Active problems:  Melena- acute blood loss anemia/ AOCD - Hb was stable at 8-9 range - Anemia panel consistent with anemia of chronic disease - black stool documented by tech this AM - hemoccult is found to be positive, second black stool today - Hb has dropped from 8.3 to 7.3 today - will request GI consult, NPO, Protonix bolus and infusion, d/c ASA and Heparin (DVT),  Metoprolol - 1 U PRBC ordered  - called his wife 2x to notify of change and request consent for blood- not picking up-unable to leave message  Ambulatory dysfunction/generalized weakness and left leg affected by Polio Patient uses walker at baseline. Now maximum assist with 2 person per therapy.    -MRI brain negative for acute findings.  Ventricular prominence could reflect NPH, multiple remote cerebellar, lacunar infarct involving left thalamus. - will go to SNF when stable    IDDM-2 with hyperglycemia and neuropathy:  -Well-controlled.  A1c 6.5% -Actos d/c'd on 6/11  - have stopped Levemir and NovoLog 3 units 3 times daily with meals due to hypoglycemia 6/12- Glucose stable - cont SSI -Continue gabapentin   Essential hypertension:  -Continue metoprolol   Dementia -Continue Namenda- oriented only to person- no behavioral  issues   BPH -Continue Flomax   Hypothyroidism -Continue home Synthroid   Chronic constipation: -Continue MiraLAX       Time spent in minutes: 30 DVT prophylaxis:  SCD Code Status: Full code Family Communication:  Level of Care: Level of care: Med-Surg Disposition Plan:  Status is: Inpatient  Remains inpatient appropriate because:Unsafe d/c plan and Inpatient level of care appropriate due to severity of illness  Dispo: The patient is from: Home              Anticipated d/c is to: SNF              Patient currently is not medically stable to d/c.   Difficult to place patient No      Consultants:  none Procedures:    Antimicrobials:  Anti-infectives (From admission, onward)    None        Objective: Vitals:   11/28/20 1417 11/28/20 2117 11/29/20 0507 11/29/20 1329  BP: (!) 160/73 (!) 156/66 (!) 141/60 126/62  Pulse: 90 91 74 73  Resp: 18 20 17    Temp: 98.8 F (37.1 C) 99.6 F (37.6 C) 99.8 F (37.7 C) 98 F (36.7 C)  TempSrc: Axillary Oral Oral Oral  SpO2: 98% 98% 98% 100%  Weight:      Height:        Intake/Output Summary (Last 24 hours) at 11/29/2020 1447 Last data filed at 11/29/2020 1432 Gross per 24 hour  Intake 600 ml  Output 1450 ml  Net -850 ml  Filed Weights   11/25/20 0513  Weight: 84.5 kg    Examination: General exam: Appears comfortable  HEENT: PERRLA, oral mucosa moist, no sclera icterus or thrush Respiratory system: Clear to auscultation. Respiratory effort normal. Cardiovascular system: S1 & S2 heard, regular rate and rhythm Gastrointestinal system: Abdomen soft, non-tender, nondistended. Normal bowel sounds   Central nervous system: Alert and oriented x 1 (to person) No focal neurological deficits. Extremities: No cyanosis, clubbing or edema Skin: No rashes or ulcers Psychiatry:  Mood & affect appropriate.      Data Reviewed: I have personally reviewed following labs and imaging studies  CBC: Recent Labs  Lab  11/24/20 1145 11/25/20 0553 11/26/20 0146 11/27/20 0043 11/29/20 1248  WBC 7.2 7.1 9.1 9.0 10.6*  NEUTROABS 5.0  --   --   --   --   HGB 9.1* 8.3* 8.5* 8.3* 7.3*  HCT 29.0* 25.6* 26.1* 26.2* 21.8*  MCV 99.7 98.5 98.1 101.2* 96.5  PLT 225 230 234 177 353    Basic Metabolic Panel: Recent Labs  Lab 11/24/20 1145 11/25/20 0553 11/26/20 0146 11/27/20 0043 11/28/20 1503  NA 137 139 139 135 134*  K 4.8 4.4 4.5 5.2* 4.7  CL 109 109 111 108 106  CO2 21* 21* 24 17* 24  GLUCOSE 247* 217* 131* 129* 127*  BUN 82* 71* 50* 32* 22  CREATININE 2.55* 2.08* 1.63* 1.47* 1.44*  CALCIUM 8.5* 7.9* 8.6* 8.2* 8.2*  MG  --   --  2.6*  --   --   PHOS  --   --  2.2*  --   --     GFR: Estimated Creatinine Clearance: 39.6 mL/min (A) (by C-G formula based on SCr of 1.44 mg/dL (H)). Liver Function Tests: Recent Labs  Lab 11/24/20 1145 11/26/20 0146  AST 18  --   ALT 19  --   ALKPHOS 109  --   BILITOT 0.1*  --   PROT 6.9  --   ALBUMIN 2.9* 2.6*    Recent Labs  Lab 11/24/20 1145  LIPASE 46    No results for input(s): AMMONIA in the last 168 hours. Coagulation Profile: No results for input(s): INR, PROTIME in the last 168 hours. Cardiac Enzymes: No results for input(s): CKTOTAL, CKMB, CKMBINDEX, TROPONINI in the last 168 hours. BNP (last 3 results) No results for input(s): PROBNP in the last 8760 hours. HbA1C: No results for input(s): HGBA1C in the last 72 hours. CBG: Recent Labs  Lab 11/28/20 1136 11/28/20 1648 11/28/20 2029 11/29/20 0646 11/29/20 1151  GLUCAP 153* 123* 100* 119* 187*    Lipid Profile: No results for input(s): CHOL, HDL, LDLCALC, TRIG, CHOLHDL, LDLDIRECT in the last 72 hours. Thyroid Function Tests: No results for input(s): TSH, T4TOTAL, FREET4, T3FREE, THYROIDAB in the last 72 hours. Anemia Panel: No results for input(s): VITAMINB12, FOLATE, FERRITIN, TIBC, IRON, RETICCTPCT in the last 72 hours. Urine analysis:    Component Value Date/Time    COLORURINE YELLOW 11/24/2020 2200   APPEARANCEUR HAZY (A) 11/24/2020 2200   LABSPEC 1.018 11/24/2020 2200   PHURINE 5.0 11/24/2020 2200   GLUCOSEU NEGATIVE 11/24/2020 2200   HGBUR SMALL (A) 11/24/2020 2200   BILIRUBINUR NEGATIVE 11/24/2020 2200   KETONESUR NEGATIVE 11/24/2020 2200   PROTEINUR NEGATIVE 11/24/2020 2200   UROBILINOGEN 0.2 10/25/2014 1520   NITRITE NEGATIVE 11/24/2020 2200   LEUKOCYTESUR SMALL (A) 11/24/2020 2200   Sepsis Labs: @LABRCNTIP (procalcitonin:4,lacticidven:4) ) Recent Results (from the past 240 hour(s))  Resp Panel by RT-PCR (Flu  A&B, Covid) Nasopharyngeal Swab     Status: None   Collection Time: 11/25/20 12:56 AM   Specimen: Nasopharyngeal Swab; Nasopharyngeal(NP) swabs in vial transport medium  Result Value Ref Range Status   SARS Coronavirus 2 by RT PCR NEGATIVE NEGATIVE Final    Comment: (NOTE) SARS-CoV-2 target nucleic acids are NOT DETECTED.  The SARS-CoV-2 RNA is generally detectable in upper respiratory specimens during the acute phase of infection. The lowest concentration of SARS-CoV-2 viral copies this assay can detect is 138 copies/mL. A negative result does not preclude SARS-Cov-2 infection and should not be used as the sole basis for treatment or other patient management decisions. A negative result may occur with  improper specimen collection/handling, submission of specimen other than nasopharyngeal swab, presence of viral mutation(s) within the areas targeted by this assay, and inadequate number of viral copies(<138 copies/mL). A negative result must be combined with clinical observations, patient history, and epidemiological information. The expected result is Negative.  Fact Sheet for Patients:  EntrepreneurPulse.com.au  Fact Sheet for Healthcare Providers:  IncredibleEmployment.be  This test is no t yet approved or cleared by the [place] FDA and  has been authorized for detection and/or  diagnosis of SARS-CoV-2 by FDA under an Emergency Use Authorization (EUA). This EUA will remain  in effect (meaning this test can be used) for the duration of the COVID-19 declaration under Section 564(b)(1) of the Act, 21 U.S.C.section 360bbb-3(b)(1), unless the authorization is terminated  or revoked sooner.       Influenza A by PCR NEGATIVE NEGATIVE Final   Influenza B by PCR NEGATIVE NEGATIVE Final    Comment: (NOTE) The Xpert Xpress SARS-CoV-2/FLU/RSV plus assay is intended as an aid in the diagnosis of influenza from Nasopharyngeal swab specimens and should not be used as a sole basis for treatment. Nasal washings and aspirates are unacceptable for Xpert Xpress SARS-CoV-2/FLU/RSV testing.  Fact Sheet for Patients: EntrepreneurPulse.com.au  Fact Sheet for Healthcare Providers: IncredibleEmployment.be  This test is not yet approved or cleared by the [place] FDA and has been authorized for detection and/or diagnosis of SARS-CoV-2 by FDA under an Emergency Use Authorization (EUA). This EUA will remain in effect (meaning this test can be used) for the duration of the COVID-19 declaration under Section 564(b)(1) of the Act, 21 U.S.C. section 360bbb-3(b)(1), unless the authorization is terminated or revoked.  Performed at [facility], [place] [address] [place], [place] [address]          Radiology Studies: No results found.    Scheduled Meds:  atorvastatin  20 mg Oral QPM   brimonidine  1 drop Both Eyes BID   dorzolamide-timolol  1 drop Both Eyes BID   gabapentin  600 mg Oral BID   insulin aspart  0-5 Units Subcutaneous QHS   insulin aspart  0-9 Units Subcutaneous TID WC   latanoprost  1 drop Both Eyes QHS   levothyroxine  88 mcg Oral QHS   memantine  10 mg Oral BID   metoprolol tartrate  12.5 mg Oral BID   multivitamin with minerals  1 tablet Oral Daily   Netarsudil Dimesylate  1 drop Both Eyes QHS   [START ON  12/03/2020] pantoprazole  40 mg Intravenous Q12H   polyethylene glycol  17 g Oral Daily   tamsulosin  0.4 mg Oral BID   Continuous Infusions:  pantoprazole     pantoprozole (PROTONIX) infusion       LOS: 4 days      Tonny Isensee  Wynelle Cleveland, MD [facility] Pager: [email or web address] 11/29/2020, 2:47 PM

## 2020-11-29 NOTE — H&P (View-Only) (Signed)
Referring Provider: Dr. Wynelle Cleveland Primary Care Physician:  Jani Gravel, MD Primary Gastroenterologist:  [facility] (prior patient of Dr. Penelope Coop)  Reason for Consultation:  Anemia, melena  HPI: Mike Mack is a 85 y.o. male with past medical history of CVA, CKD stage 3, DM type 2, and HTN presenting for consultation of melena and anemia.  Patient had two melenic BMs today, denies seeing any black BMs prior to today.  He denies any other complaints. Denies nausea, vomiting, changes in appetite, dysphagia, abdominal pain, or hematochezia. Tends to have constipation and takes stool softeners or laxatives. Denies chest pain or shortness of breath.  Takes 81 mg ASA daily.  Denies NSAID or blood thinner use, though has been on Lovenox during admission.  EGD 01/2013: erythematous mucosa in antrum (bx: reactive gastropathy, negative for H pylori)  Last colonoscopy in 2010 was normal.  Past Medical History:  Diagnosis Date   Anemia 05/13/2013   Arthritis    "right leg" (07/25/2013)   BPH (benign prostatic hyperplasia)    Constipation 05/13/2013   Exertional shortness of breath    "sometimes" (07/25/2013)   History of kidney stones    History of stomach ulcers    Hypertension    Neuropathy    Polio    Polio Childhood   Small bowel obstruction ([place])    Stroke ([place]) 2014   residual:  "left hand kind of numb" (07/25/2013)   Type II diabetes mellitus ([place])     Past Surgical History:  Procedure Laterality Date   BOWEL RESECTION     CATARACT EXTRACTION W/ INTRAOCULAR LENS  IMPLANT, BILATERAL Bilateral 2000's   CHOLECYSTECTOMY N/A 07/25/2013   Procedure: LAPAROSCOPIC CHOLECYSTECTOMY WITH INTRAOPERATIVE CHOLANGIOGRAM;  Surgeon: [name], MD;  Location: [facility];  Service: General;  Laterality: N/A;   COLON SURGERY     COLONOSCOPY     Hx: of   CYSTOSCOPY WITH BIOPSY N/A 12/01/2019   Procedure: CYSTOSCOPY WITH BLADDER BIOPSY AND FULGURATION;  Surgeon: Lucas Mallow, MD;  Location[facility];   Service: Urology;  Laterality: N/A;   EXCISIONAL HEMORRHOIDECTOMY  2000's   EXTRACORPOREAL SHOCK WAVE LITHOTRIPSY Right 10/03/2018   Procedure: EXTRACORPOREAL SHOCK WAVE LITHOTRIPSY (ESWL);  Surgeon: Festus Aloe, MD;  Location[facility];  Service: Urology;  Laterality: Right;   EYE SURGERY     INGUINAL HERNIA REPAIR Right 1970's   IR RADIOLOGIST EVAL & MGMT  04/06/2020   LAPAROSCOPIC CHOLECYSTECTOMY  07/25/2013   w/LOA (07/25/2013)    Prior to Admission medications   Medication Sig Start Date End Date Taking? Authorizing Provider  amLODipine (NORVASC) 5 MG tablet Take 5 mg by mouth daily. 09/20/20  Yes [provider]  aspirin EC 81 MG tablet Take 81 mg by mouth daily.   Yes [provider]  atorvastatin (LIPITOR) 20 MG tablet Take 20 mg by mouth every evening.  11/03/13  Yes [provider]  B Complex Vitamins (B COMPLEX PO) Take 1 tablet by mouth at bedtime.   Yes [provider]  brimonidine (ALPHAGAN) 0.2 % ophthalmic solution Place 1 drop into both eyes 2 (two) times daily. 12/28/19  Yes [provider]  cholecalciferol (VITAMIN D3) 10 MCG (400 UNIT) TABS tablet Take 400 Units by mouth daily.   Yes [provider]  dorzolamide-timolol (COSOPT) 22.3-6.8 MG/ML ophthalmic solution Place 1 drop into both eyes 2 (two) times daily. 03/04/20  Yes [provider]  gabapentin (NEURONTIN) 600 MG tablet Take 600 mg by mouth 2 (two)  times daily. 01/20/20  Yes [provider]  glucose blood test strip 1-2 times daily 04/04/15  Yes [provider]  insulin aspart (FIASP FLEXTOUCH) 100 UNIT/ML FlexTouch Pen Inject 6 Units into the skin with breakfast, with lunch, and with evening meal.   Yes [provider]  insulin degludec (TRESIBA FLEXTOUCH) 100 UNIT/ML FlexTouch Pen Inject 20 Units into the skin daily. 09/08/20  Yes [provider]  insulin degludec (TRESIBA) 100 UNIT/ML FlexTouch Pen Inject 14 Units into the  skin at bedtime. 11/27/20  Yes Rizwan, Eunice Blase, MD  latanoprost (XALATAN) 0.005 % ophthalmic solution Place 1 drop into both eyes at bedtime.  11/17/14  Yes [provider]  levothyroxine (SYNTHROID) 88 MCG tablet Take 88 mcg by mouth at bedtime.  04/21/11  Yes [provider]  memantine (NAMENDA) 10 MG tablet Take 10 mg by mouth 2 (two) times daily. 11/04/20  Yes [provider]  metoprolol tartrate (LOPRESSOR) 25 MG tablet Take 25 mg by mouth 2 (two) times daily. 11/18/20  Yes [provider]  olmesartan (BENICAR) 20 MG tablet Take 20 mg by mouth daily. 02/20/20  Yes [provider]  pioglitazone (ACTOS) 30 MG tablet Take 30 mg by mouth daily. 03/04/20  Yes [provider]  polyethylene glycol (MIRALAX / GLYCOLAX) packet Take 17 g by mouth daily. 09/06/13  Yes [name], MD  RHOPRESSA 0.02 % SOLN Place 1 drop into both eyes at bedtime. 03/05/20  Yes [provider]  Tamsulosin HCl (FLOMAX) 0.4 MG CAPS Take 0.4 mg by mouth in the morning and at bedtime.   Yes [provider]  acetaminophen (TYLENOL) 325 MG tablet Take 2 tablets (650 mg total) by mouth every 6 (six) hours as needed for mild pain (or Fever >/= 101). 11/27/20   Debbe Odea, MD  feeding supplement, GLUCERNA SHAKE, (GLUCERNA SHAKE) LIQD Take 237 mLs by mouth 3 (three) times daily between meals. 03/25/20   Antonieta Pert, MD  metoprolol tartrate (LOPRESSOR) 25 MG tablet Take 0.5 tablets (12.5 mg total) by mouth 2 (two) times daily. 11/27/20   Debbe Odea, MD  metoprolol tartrate (LOPRESSOR) 50 MG tablet Take 1 tablet (50 mg total) by mouth 2 (two) times daily. Patient not taking: No sig reported 03/25/20   Antonieta Pert, MD  Multiple Vitamin (MULTIVITAMIN WITH MINERALS) TABS tablet Take 1 tablet by mouth daily. 09/06/13   [name], MD  ondansetron (ZOFRAN ODT) 4 MG disintegrating tablet 4mg  ODT q4 hours prn nausea/vomit 08/24/19   Drenda Freeze, MD    Scheduled  Meds:  sodium chloride   Intravenous Once   atorvastatin  20 mg Oral QPM   brimonidine  1 drop Both Eyes BID   dorzolamide-timolol  1 drop Both Eyes BID   gabapentin  600 mg Oral BID   insulin aspart  0-5 Units Subcutaneous QHS   insulin aspart  0-9 Units Subcutaneous TID WC   latanoprost  1 drop Both Eyes QHS   levothyroxine  88 mcg Oral QHS   memantine  10 mg Oral BID   multivitamin with minerals  1 tablet Oral Daily   Netarsudil Dimesylate  1 drop Both Eyes QHS   [START ON 12/03/2020] pantoprazole  40 mg Intravenous Q12H   tamsulosin  0.4 mg Oral BID   Continuous Infusions:  sodium chloride     pantoprazole     pantoprozole (PROTONIX) infusion     PRN Meds:.acetaminophen **OR** acetaminophen, ondansetron **OR** ondansetron (ZOFRAN) IV  Allergies  as of 11/24/2020 - Review Complete 11/24/2020  Allergen Reaction Noted   Metformin and related Other (See Comments) 03/23/2019   Other  03/23/2019   [name] [insulin glargine-lixisenatide] Other (See Comments) 03/23/2019    Family History  Problem Relation Age of Onset   Heart failure Mother    Diabetes Sister    Diabetes Sister    Diabetes Brother    Hypertension Sister    Hypertension Sister    Hypertension Brother    Heart attack Sister     Social History   Socioeconomic History   Marital status: Married    Spouse name: Christobal Morado   Number of children: 2   Years of education: Not on file   Highest education level: Not on file  Occupational History   Occupation: Retired, Curator  Tobacco Use   Smoking status: Never   Smokeless tobacco: Never  Vaping Use   Vaping Use: Never used  Substance and Sexual Activity   Alcohol use: No   Drug use: No   Sexual activity: Yes  Other Topics Concern   Not on file  Social History Narrative   Married.  Lives in [place] with wife.  Ambulates with a cane.   Social Determinants of Health   Financial Resource Strain: Not on file  Food Insecurity: Not on file   Transportation Needs: Not on file  Physical Activity: Not on file  Stress: Not on file  Social Connections: Not on file  Intimate Partner Violence: Not on file    Review of Systems: Review of Systems  Constitutional:  Negative for chills and fever.  HENT:  Negative for sore throat.   Eyes:  Negative for pain and redness.  Respiratory:  Negative for cough, shortness of breath and stridor.   Cardiovascular:  Negative for chest pain and palpitations.  Gastrointestinal:  Positive for melena. Negative for abdominal pain, blood in stool, constipation, diarrhea, heartburn, nausea and vomiting.  Genitourinary:  Negative for flank pain and hematuria.  Musculoskeletal:  Negative for back pain and joint pain.  Skin:  Negative for itching and rash.  Neurological:  Positive for weakness. Negative for loss of consciousness.  Endo/Heme/Allergies:  Negative for polydipsia. Does not bruise/bleed easily.  Psychiatric/Behavioral:  Negative for substance abuse. The patient is not nervous/anxious.    Physical Exam: Vital signs: Vitals:   11/29/20 0507 11/29/20 1329  BP: (!) 141/60 126/62  Pulse: 74 73  Resp: 17   Temp: 99.8 F (37.7 C) 98 F (36.7 C)  SpO2: 98% 100%   Last BM Date: 11/28/20 Physical Exam Vitals reviewed.  Constitutional:      General: He is not in acute distress. HENT:     Head: Normocephalic and atraumatic.     Nose: Nose normal. No congestion.     Mouth/Throat:     Mouth: Mucous membranes are moist.     Pharynx: Oropharynx is clear.  Eyes:     Extraocular Movements: Extraocular movements intact.     Comments: Conjunctival pallor  Cardiovascular:     Rate and Rhythm: Normal rate and regular rhythm.     Pulses: Normal pulses.  Pulmonary:     Effort: Pulmonary effort is normal. No respiratory distress.  Abdominal:     General: Bowel sounds are normal. There is distension (mild).     Palpations: Abdomen is soft. There is no mass.     Tenderness: There is no  abdominal tenderness. There is no guarding or rebound.  Musculoskeletal:  General: No swelling or tenderness.     Cervical back: Normal range of motion and neck supple.  Skin:    General: Skin is warm and dry.  Neurological:     Mental Status: He is alert and oriented to person, place, and time. Mental status is at baseline.  Psychiatric:        Mood and Affect: Mood normal.        Behavior: Behavior normal. Behavior is cooperative.     GI:  Lab Results: Recent Labs    11/27/20 0043 11/29/20 1248  WBC 9.0 10.6*  HGB 8.3* 7.3*  HCT 26.2* 21.8*  PLT 177 257   BMET Recent Labs    11/27/20 0043 11/28/20 1503  NA 135 134*  K 5.2* 4.7  CL 108 106  CO2 17* 24  GLUCOSE 129* 127*  BUN 32* 22  CREATININE 1.47* 1.44*  CALCIUM 8.2* 8.2*   LFT No results for input(s): PROT, ALBUMIN, AST, ALT, ALKPHOS, BILITOT, BILIDIR, IBILI in the last 72 hours. PT/INR No results for input(s): LABPROT, INR in the last 72 hours.   Studies/Results: No results found.  Impression: Melena, anemia. Patient on 81 mg ASA daily and Lovenox during admission.  Currently hemodynamically stable with regular HR and BP. -Hgb 7.3 today, decreased from 8.3 two days ago  AKI on CKD: BUN 22/ Cr 1.44 as of 6/12  Weakness, history of polio  Plan: EGD tomorrow for further evaluation. I thoroughly discussed the procedure with the patient to include nature, alternatives, benefits, and risks (including but not limited to bleeding, infection, perforation, anesthesia/cardiac and pulmonary complications).  Patient verbalized understanding and gave verbal consent to proceed with EGD.   Soft diet OK, NPO after midnight.  Continue Protonix IV.   Continue to monitor H&H with transfusion as needed to maintain Hgb >7-8.     [facility] will follow.   LOS: 4 days   Salley Slaughter  PA-C 11/29/2020, 3:43 PM  Contact #  [phone]

## 2020-11-29 NOTE — Care Management Important Message (Signed)
Important Message  Patient Details  Name: Mike Mack MRN: [id number] Date of Birth: [birth year]   Medicare Important Message Given:  Yes     Quantina Dershem [address] 11/29/2020, 3:55 PM

## 2020-11-29 NOTE — Progress Notes (Signed)
Black BM documented in chart today. Check stat Hb, hemoccult. Hold ASA and Heparin [place] for now. If he is not bleeding, he is stable to dc to SNF today.

## 2020-11-29 NOTE — Consult Note (Addendum)
Referring Provider: Dr. Wynelle Cleveland Primary Care Physician:  Jani Gravel, MD Primary Gastroenterologist:  [facility] (prior patient of Dr. Penelope Coop)  Reason for Consultation:  Anemia, melena  HPI: Mike Mack is a 85 y.o. male with past medical history of CVA, CKD stage 3, DM type 2, and HTN presenting for consultation of melena and anemia.  Patient had two melenic BMs today, denies seeing any black BMs prior to today.  He denies any other complaints. Denies nausea, vomiting, changes in appetite, dysphagia, abdominal pain, or hematochezia. Tends to have constipation and takes stool softeners or laxatives. Denies chest pain or shortness of breath.  Takes 81 mg ASA daily.  Denies NSAID or blood thinner use, though has been on Lovenox during admission.  EGD 01/2013: erythematous mucosa in antrum (bx: reactive gastropathy, negative for H pylori)  Last colonoscopy in 2010 was normal.  Past Medical History:  Diagnosis Date   Anemia 05/13/2013   Arthritis    "right leg" (07/25/2013)   BPH (benign prostatic hyperplasia)    Constipation 05/13/2013   Exertional shortness of breath    "sometimes" (07/25/2013)   History of kidney stones    History of stomach ulcers    Hypertension    Neuropathy    Polio    Polio Childhood   Small bowel obstruction ([place])    Stroke ([place]) 2014   residual:  "left hand kind of numb" (07/25/2013)   Type II diabetes mellitus ([place])     Past Surgical History:  Procedure Laterality Date   BOWEL RESECTION     CATARACT EXTRACTION W/ INTRAOCULAR LENS  IMPLANT, BILATERAL Bilateral 2000's   CHOLECYSTECTOMY N/A 07/25/2013   Procedure: LAPAROSCOPIC CHOLECYSTECTOMY WITH INTRAOPERATIVE CHOLANGIOGRAM;  Surgeon: [name], MD;  Location: [facility];  Service: General;  Laterality: N/A;   COLON SURGERY     COLONOSCOPY     Hx: of   CYSTOSCOPY WITH BIOPSY N/A 12/01/2019   Procedure: CYSTOSCOPY WITH BLADDER BIOPSY AND FULGURATION;  Surgeon: Lucas Mallow, MD;  [facility];   Service: Urology;  Laterality: N/A;   EXCISIONAL HEMORRHOIDECTOMY  2000's   EXTRACORPOREAL SHOCK WAVE LITHOTRIPSY Right 10/03/2018   Procedure: EXTRACORPOREAL SHOCK WAVE LITHOTRIPSY (ESWL);  Surgeon: Festus Aloe, MD;  [facility];  Service: Urology;  Laterality: Right;   EYE SURGERY     INGUINAL HERNIA REPAIR Right 1970's   IR RADIOLOGIST EVAL & MGMT  04/06/2020   LAPAROSCOPIC CHOLECYSTECTOMY  07/25/2013   w/LOA (07/25/2013)    Prior to Admission medications   Medication Sig Start Date End Date Taking? Authorizing Provider  amLODipine (NORVASC) 5 MG tablet Take 5 mg by mouth daily. 09/20/20  Yes [provider]  aspirin EC 81 MG tablet Take 81 mg by mouth daily.   Yes [provider]  atorvastatin (LIPITOR) 20 MG tablet Take 20 mg by mouth every evening.  11/03/13  Yes [provider]  B Complex Vitamins (B COMPLEX PO) Take 1 tablet by mouth at bedtime.   Yes [provider]  brimonidine (ALPHAGAN) 0.2 % ophthalmic solution Place 1 drop into both eyes 2 (two) times daily. 12/28/19  Yes [provider]  cholecalciferol (VITAMIN D3) 10 MCG (400 UNIT) TABS tablet Take 400 Units by mouth daily.   Yes [provider]  dorzolamide-timolol (COSOPT) 22.3-6.8 MG/ML ophthalmic solution Place 1 drop into both eyes 2 (two) times daily. 03/04/20  Yes [provider]  gabapentin (NEURONTIN) 600 MG tablet Take 600 mg by mouth 2 (two)  times daily. 01/20/20  Yes [provider]  glucose blood test strip 1-2 times daily 04/04/15  Yes [provider]  insulin aspart (FIASP FLEXTOUCH) 100 UNIT/ML FlexTouch Pen Inject 6 Units into the skin with breakfast, with lunch, and with evening meal.   Yes [provider]  insulin degludec (TRESIBA FLEXTOUCH) 100 UNIT/ML FlexTouch Pen Inject 20 Units into the skin daily. 09/08/20  Yes [provider]  insulin degludec (TRESIBA) 100 UNIT/ML FlexTouch Pen Inject 14 Units into the  skin at bedtime. 11/27/20  Yes Rizwan, Eunice Blase, MD  latanoprost (XALATAN) 0.005 % ophthalmic solution Place 1 drop into both eyes at bedtime.  11/17/14  Yes [provider]  levothyroxine (SYNTHROID) 88 MCG tablet Take 88 mcg by mouth at bedtime.  04/21/11  Yes [provider]  memantine (NAMENDA) 10 MG tablet Take 10 mg by mouth 2 (two) times daily. 11/04/20  Yes [provider]  metoprolol tartrate (LOPRESSOR) 25 MG tablet Take 25 mg by mouth 2 (two) times daily. 11/18/20  Yes [provider]  olmesartan (BENICAR) 20 MG tablet Take 20 mg by mouth daily. 02/20/20  Yes [provider]  pioglitazone (ACTOS) 30 MG tablet Take 30 mg by mouth daily. 03/04/20  Yes [provider]  polyethylene glycol (MIRALAX / GLYCOLAX) packet Take 17 g by mouth daily. 09/06/13  Yes [name], MD  RHOPRESSA 0.02 % SOLN Place 1 drop into both eyes at bedtime. 03/05/20  Yes [provider]  Tamsulosin HCl (FLOMAX) 0.4 MG CAPS Take 0.4 mg by mouth in the morning and at bedtime.   Yes [provider]  acetaminophen (TYLENOL) 325 MG tablet Take 2 tablets (650 mg total) by mouth every 6 (six) hours as needed for mild pain (or Fever >/= 101). 11/27/20   Debbe Odea, MD  feeding supplement, GLUCERNA SHAKE, (GLUCERNA SHAKE) LIQD Take 237 mLs by mouth 3 (three) times daily between meals. 03/25/20   Antonieta Pert, MD  metoprolol tartrate (LOPRESSOR) 25 MG tablet Take 0.5 tablets (12.5 mg total) by mouth 2 (two) times daily. 11/27/20   Debbe Odea, MD  metoprolol tartrate (LOPRESSOR) 50 MG tablet Take 1 tablet (50 mg total) by mouth 2 (two) times daily. Patient not taking: No sig reported 03/25/20   Antonieta Pert, MD  Multiple Vitamin (MULTIVITAMIN WITH MINERALS) TABS tablet Take 1 tablet by mouth daily. 09/06/13   [name], MD  ondansetron (ZOFRAN ODT) 4 MG disintegrating tablet 4mg  ODT q4 hours prn nausea/vomit 08/24/19   Drenda Freeze, MD    Scheduled  Meds:  sodium chloride   Intravenous Once   atorvastatin  20 mg Oral QPM   brimonidine  1 drop Both Eyes BID   dorzolamide-timolol  1 drop Both Eyes BID   gabapentin  600 mg Oral BID   insulin aspart  0-5 Units Subcutaneous QHS   insulin aspart  0-9 Units Subcutaneous TID WC   latanoprost  1 drop Both Eyes QHS   levothyroxine  88 mcg Oral QHS   memantine  10 mg Oral BID   multivitamin with minerals  1 tablet Oral Daily   Netarsudil Dimesylate  1 drop Both Eyes QHS   [START ON 12/03/2020] pantoprazole  40 mg Intravenous Q12H   tamsulosin  0.4 mg Oral BID   Continuous Infusions:  sodium chloride     pantoprazole     pantoprozole (PROTONIX) infusion     PRN Meds:.acetaminophen **OR** acetaminophen, ondansetron **OR** ondansetron (ZOFRAN) IV  Allergies  as of 11/24/2020 - Review Complete 11/24/2020  Allergen Reaction Noted   Metformin and related Other (See Comments) 03/23/2019   Other  03/23/2019   [name] [insulin glargine-lixisenatide] Other (See Comments) 03/23/2019    Family History  Problem Relation Age of Onset   Heart failure Mother    Diabetes Sister    Diabetes Sister    Diabetes Brother    Hypertension Sister    Hypertension Sister    Hypertension Brother    Heart attack Sister     Social History   Socioeconomic History   Marital status: Married    Spouse name: Coolidge Gossard   Number of children: 2   Years of education: Not on file   Highest education level: Not on file  Occupational History   Occupation: Retired, Curator  Tobacco Use   Smoking status: Never   Smokeless tobacco: Never  Vaping Use   Vaping Use: Never used  Substance and Sexual Activity   Alcohol use: No   Drug use: No   Sexual activity: Yes  Other Topics Concern   Not on file  Social History Narrative   Married.  Lives in [place] with wife.  Ambulates with a cane.   Social Determinants of Health   Financial Resource Strain: Not on file  Food Insecurity: Not on file   Transportation Needs: Not on file  Physical Activity: Not on file  Stress: Not on file  Social Connections: Not on file  Intimate Partner Violence: Not on file    Review of Systems: Review of Systems  Constitutional:  Negative for chills and fever.  HENT:  Negative for sore throat.   Eyes:  Negative for pain and redness.  Respiratory:  Negative for cough, shortness of breath and stridor.   Cardiovascular:  Negative for chest pain and palpitations.  Gastrointestinal:  Positive for melena. Negative for abdominal pain, blood in stool, constipation, diarrhea, heartburn, nausea and vomiting.  Genitourinary:  Negative for flank pain and hematuria.  Musculoskeletal:  Negative for back pain and joint pain.  Skin:  Negative for itching and rash.  Neurological:  Positive for weakness. Negative for loss of consciousness.  Endo/Heme/Allergies:  Negative for polydipsia. Does not bruise/bleed easily.  Psychiatric/Behavioral:  Negative for substance abuse. The patient is not nervous/anxious.    Physical Exam: Vital signs: Vitals:   11/29/20 0507 11/29/20 1329  BP: (!) 141/60 126/62  Pulse: 74 73  Resp: 17   Temp: 99.8 F (37.7 C) 98 F (36.7 C)  SpO2: 98% 100%   Last BM Date: 11/28/20 Physical Exam Vitals reviewed.  Constitutional:      General: He is not in acute distress. HENT:     Head: Normocephalic and atraumatic.     Nose: Nose normal. No congestion.     Mouth/Throat:     Mouth: Mucous membranes are moist.     Pharynx: Oropharynx is clear.  Eyes:     Extraocular Movements: Extraocular movements intact.     Comments: Conjunctival pallor  Cardiovascular:     Rate and Rhythm: Normal rate and regular rhythm.     Pulses: Normal pulses.  Pulmonary:     Effort: Pulmonary effort is normal. No respiratory distress.  Abdominal:     General: Bowel sounds are normal. There is distension (mild).     Palpations: Abdomen is soft. There is no mass.     Tenderness: There is no  abdominal tenderness. There is no guarding or rebound.  Musculoskeletal:  General: No swelling or tenderness.     Cervical back: Normal range of motion and neck supple.  Skin:    General: Skin is warm and dry.  Neurological:     Mental Status: He is alert and oriented to person, place, and time. Mental status is at baseline.  Psychiatric:        Mood and Affect: Mood normal.        Behavior: Behavior normal. Behavior is cooperative.     GI:  Lab Results: Recent Labs    11/27/20 0043 11/29/20 1248  WBC 9.0 10.6*  HGB 8.3* 7.3*  HCT 26.2* 21.8*  PLT 177 257   BMET Recent Labs    11/27/20 0043 11/28/20 1503  NA 135 134*  K 5.2* 4.7  CL 108 106  CO2 17* 24  GLUCOSE 129* 127*  BUN 32* 22  CREATININE 1.47* 1.44*  CALCIUM 8.2* 8.2*   LFT No results for input(s): PROT, ALBUMIN, AST, ALT, ALKPHOS, BILITOT, BILIDIR, IBILI in the last 72 hours. PT/INR No results for input(s): LABPROT, INR in the last 72 hours.   Studies/Results: No results found.  Impression: Melena, anemia. Patient on 81 mg ASA daily and Lovenox during admission.  Currently hemodynamically stable with regular HR and BP. -Hgb 7.3 today, decreased from 8.3 two days ago  AKI on CKD: BUN 22/ Cr 1.44 as of 6/12  Weakness, history of polio  Plan: EGD tomorrow for further evaluation. I thoroughly discussed the procedure with the patient to include nature, alternatives, benefits, and risks (including but not limited to bleeding, infection, perforation, anesthesia/cardiac and pulmonary complications).  Patient verbalized understanding and gave verbal consent to proceed with EGD.   Soft diet OK, NPO after midnight.  Continue Protonix IV.   Continue to monitor H&H with transfusion as needed to maintain Hgb >7-8.     [facility] will follow.   LOS: 4 days   Salley Slaughter  PA-C 11/29/2020, 3:43 PM  Contact #  [phone]

## 2020-11-29 NOTE — Progress Notes (Signed)
Physical Therapy Treatment Patient Details Name: Mike Mack MRN: [id number] DOB: 1936/05/18 Today's Date: 11/29/2020    History of Present Illness Pt presented to ED 6/8 with weakness and inability to ambulate. Pt found to have AKI. PMH - polio with chronic weakness of RLE, DM, HTN, anemia, CVA    PT Comments    Pt remains very deconditioned and debilitated. Focused on sit to stand and standing tolerance in the steady today. Pt to continue to benefit from SNF upon d/c to maximize functional recovery. Acute PT to cont to follow.    Follow Up Recommendations  SNF     Equipment Recommendations  None recommended by PT    Recommendations for Other Services       Precautions / Restrictions Precautions Precautions: Fall Precaution Comments: chronic R LE dysfunction d/t childhood polio Restrictions Weight Bearing Restrictions: No    Mobility  Bed Mobility Overal bed mobility: Needs Assistance Bed Mobility: Supine to Sit     Supine to sit: HOB elevated;Max assist     General bed mobility comments: max directional verbal cues, maxA for trunk elevation, modA for LE management, maxA for posterior support once in sitting    Transfers Overall transfer level: Needs assistance   Transfers: Sit to/from Stand;Stand Pivot Transfers Sit to Stand: +2 physical assistance;Mod assist Stand pivot transfers:  (dependent via stedy)       General transfer comment: pt pulled up on stedy and PT/tech used bed pad underneath, worked on sit to stand from seat of steady, pt only requiring minA from elevated surface, pt able to hold standing with max encouragement and tactile cues at trunk for up to 90 sec, pt completed 3 trials  Ambulation/Gait             General Gait Details: Unable to due to weakness/asterixis   Stairs             Wheelchair Mobility    Modified Rankin (Stroke Patients Only)       Balance Overall balance assessment: Needs  assistance Sitting-balance support: Feet supported Sitting balance-Leahy Scale: Poor Sitting balance - Comments: UE support Postural control: Posterior lean Standing balance support: Bilateral upper extremity supported Standing balance-Leahy Scale: Poor Standing balance comment: stedy                            Cognition Arousal/Alertness: Awake/alert Behavior During Therapy: WFL for tasks assessed/performed Overall Cognitive Status: No family/caregiver present to determine baseline cognitive functioning Area of Impairment: Memory;Following commands;Problem solving                     Memory: Decreased short-term memory Following Commands: Follows one step commands consistently;Follows one step commands with increased time     Problem Solving: Slow processing;Decreased initiation;Difficulty sequencing;Requires verbal cues;Requires tactile cues General Comments: baseline cognitive impairment per chart.      Exercises      General Comments General comments (skin integrity, edema, etc.): VSS      Pertinent Vitals/Pain Pain Assessment: No/denies pain    Home Living                      Prior Function            PT Goals (current goals can now be found in the care plan section) Acute Rehab PT Goals PT Goal Formulation: With patient Time For Goal Achievement: 12/09/20 Potential to Achieve Goals: Good Progress towards PT  goals: Progressing toward goals    Frequency    Min 3X/week      PT Plan Current plan remains appropriate    Co-evaluation              AM-PAC PT "6 Clicks" Mobility   Outcome Measure  Help needed turning from your back to your side while in a flat bed without using bedrails?: A Little Help needed moving from lying on your back to sitting on the side of a flat bed without using bedrails?: A Lot Help needed moving to and from a bed to a chair (including a wheelchair)?: Total Help needed standing up from a  chair using your arms (e.g., wheelchair or bedside chair)?: Total Help needed to walk in hospital room?: Total Help needed climbing 3-5 steps with a railing? : Total 6 Click Score: 9    End of Session Equipment Utilized During Treatment: Gait belt Activity Tolerance: Patient tolerated treatment well Patient left: in chair;with call bell/phone within reach;with chair alarm set;with family/visitor present Nurse Communication: Mobility status;Need for lift equipment (recommend Stedy) PT Visit Diagnosis: Unsteadiness on feet (R26.81);Other abnormalities of gait and mobility (R26.89);Muscle weakness (generalized) (M62.81)     Time: 7680-8811 PT Time Calculation (min) (ACUTE ONLY): 18 min  Charges:  $Therapeutic Activity: 8-22 mins                     Kittie Plater, PT, DPT Acute Rehabilitation Services Pager #: [phone] Office #: [phone]    Berline Lopes 11/29/2020, 2:43 PM

## 2020-11-29 NOTE — TOC Progression Note (Signed)
Transition of Care [facility]) - Progression Note    Patient Details  Name: Mike Mack MRN: [id number] Date of Birth: [birth year]  [facility]) CM/SW [place], [place] Phone Number: 11/29/2020, 4:25 PM  Clinical Narrative:     CSW eached out to [facility] and they do not have any beds at this time. CSW has reached out to [facility] and and is waiting on a call back.  CSW left VM with Wife to review current bed offers.   Expected Discharge Plan: [place] Barriers to Discharge: Continued Medical Work up  Expected Discharge Plan and Services Expected Discharge Plan: [place] arrangements for the past 2 months: Single Family Home Expected Discharge Date: 11/27/20                                     Social Determinants of Health (SDOH) Interventions    Readmission Risk Interventions No flowsheet data found.  Emeterio Reeve, Latanya Presser, [place] Social Worker [phone]

## 2020-11-29 NOTE — Progress Notes (Signed)
Patient transferred to 4E room 16. Spouse Mike Mack was notified of his status .

## 2020-11-30 ENCOUNTER — Inpatient Hospital Stay (HOSPITAL_COMMUNITY): Payer: Medicare HMO | Admitting: Certified Registered Nurse Anesthetist

## 2020-11-30 ENCOUNTER — Encounter (HOSPITAL_COMMUNITY): Payer: Self-pay | Admitting: Family Medicine

## 2020-11-30 ENCOUNTER — Encounter (HOSPITAL_COMMUNITY)
Admission: EM | Disposition: A | Discharge status: Skilled Nursing Facility | Payer: Self-pay | Source: Home / Self Care | Attending: Internal Medicine

## 2020-11-30 ENCOUNTER — Inpatient Hospital Stay (HOSPITAL_COMMUNITY): Payer: Medicare HMO

## 2020-11-30 HISTORY — PX: BIOPSY: SHX5522

## 2020-11-30 HISTORY — PX: ESOPHAGOGASTRODUODENOSCOPY: SHX5428

## 2020-11-30 LAB — CBC
HCT: 26 % — ABNORMAL LOW (ref 39.0–52.0)
HCT: 25.4 % — ABNORMAL LOW (ref 39.0–52.0)
Hemoglobin: 8.7 g/dL — ABNORMAL LOW (ref 13.0–17.0)
Hemoglobin: 8.5 g/dL — ABNORMAL LOW (ref 13.0–17.0)
MCH: 31.2 pg (ref 26.0–34.0)
MCH: 31.7 pg (ref 26.0–34.0)
MCHC: 33.5 g/dL (ref 30.0–36.0)
MCHC: 33.5 g/dL (ref 30.0–36.0)
MCV: 93.2 fL (ref 80.0–100.0)
MCV: 94.8 fL (ref 80.0–100.0)
Platelets: 269 10*3/uL (ref 150–400)
Platelets: 264 10*3/uL (ref 150–400)
RBC: 2.79 MIL/uL — ABNORMAL LOW (ref 4.22–5.81)
RBC: 2.68 MIL/uL — ABNORMAL LOW (ref 4.22–5.81)
RDW: 14.8 % (ref 11.5–15.5)
RDW: 14.8 % (ref 11.5–15.5)
WBC: 11.7 10*3/uL — ABNORMAL HIGH (ref 4.0–10.5)
WBC: 10.1 10*3/uL (ref 4.0–10.5)
nRBC: 0.3 % — ABNORMAL HIGH (ref 0.0–0.2)
nRBC: 0.3 % — ABNORMAL HIGH (ref 0.0–0.2)

## 2020-11-30 LAB — BPAM RBC
Blood Product Expiration Date: 202206202359
ISSUE DATE / TIME: 202206131734
Unit Type and Rh: 6200

## 2020-11-30 LAB — URINALYSIS, ROUTINE W REFLEX MICROSCOPIC
Bilirubin Urine: NEGATIVE
Glucose, UA: NEGATIVE mg/dL
Ketones, ur: 5 mg/dL — AB
Leukocytes,Ua: NEGATIVE
Nitrite: NEGATIVE
Protein, ur: 30 mg/dL — AB
Specific Gravity, Urine: 1.013 (ref 1.005–1.030)
pH: 6 (ref 5.0–8.0)

## 2020-11-30 LAB — TYPE AND SCREEN
ABO/RH(D): A POS
Antibody Screen: NEGATIVE
Unit division: 0

## 2020-11-30 LAB — BASIC METABOLIC PANEL
Anion gap: 4 — ABNORMAL LOW (ref 5–15)
BUN: 22 mg/dL (ref 8–23)
CO2: 22 mmol/L (ref 22–32)
Calcium: 8.1 mg/dL — ABNORMAL LOW (ref 8.9–10.3)
Chloride: 107 mmol/L (ref 98–111)
Creatinine, Ser: 1.46 mg/dL — ABNORMAL HIGH (ref 0.61–1.24)
GFR, Estimated: 48 mL/min — ABNORMAL LOW (ref >60)
Glucose, Bld: 118 mg/dL — ABNORMAL HIGH (ref 70–99)
Potassium: 4.5 mmol/L (ref 3.5–5.1)
Sodium: 133 mmol/L — ABNORMAL LOW (ref 135–145)

## 2020-11-30 LAB — GLUCOSE, CAPILLARY
Glucose-Capillary: 116 mg/dL — ABNORMAL HIGH (ref 70–99)
Glucose-Capillary: 102 mg/dL — ABNORMAL HIGH (ref 70–99)
Glucose-Capillary: 148 mg/dL — ABNORMAL HIGH (ref 70–99)
Glucose-Capillary: 232 mg/dL — ABNORMAL HIGH (ref 70–99)

## 2020-11-30 LAB — LACTIC ACID, PLASMA: Lactic Acid, Venous: 0.7 mmol/L (ref 0.5–1.9)

## 2020-11-30 LAB — PROCALCITONIN: Procalcitonin: 0.26 ng/mL

## 2020-11-30 SURGERY — EGD (ESOPHAGOGASTRODUODENOSCOPY)
Anesthesia: Monitor Anesthesia Care

## 2020-11-30 MED ORDER — PROPOFOL 500 MG/50ML IV EMUL
INTRAVENOUS | Status: DC | PRN
  Administered 2020-11-30: 75 ug/kg/min via INTRAVENOUS

## 2020-11-30 MED ORDER — LIDOCAINE HCL (CARDIAC) PF 100 MG/5ML IV SOSY
PREFILLED_SYRINGE | INTRAVENOUS | Status: DC | PRN
  Administered 2020-11-30: 60 mg via INTRATRACHEAL

## 2020-11-30 MED ORDER — PROPOFOL 10 MG/ML IV BOLUS
INTRAVENOUS | Status: DC | PRN
  Administered 2020-11-30: 30 mg via INTRAVENOUS

## 2020-11-30 MED ORDER — PANTOPRAZOLE SODIUM 40 MG IV SOLR
40.0000 mg | Freq: Two times a day (BID) | INTRAVENOUS | Status: DC
  Administered 2020-11-30 – 2020-12-01 (×3): 40 mg via INTRAVENOUS
  Filled 2020-11-30 (×3): qty 40

## 2020-11-30 MED ORDER — LACTATED RINGERS IV SOLN: INTRAVENOUS | Status: DC

## 2020-11-30 MED ORDER — SODIUM CHLORIDE 0.9 % IV SOLN: INTRAVENOUS | Status: DC

## 2020-11-30 NOTE — Progress Notes (Addendum)
Attempted to call pt's wife to receive consent for pt to have EGD procedure. No answer, VM left with call back number. Will call back later.   Nowell Sites M    Pt's wife called a second time with no answer, spoke with endo as well to see if they could attempt calling the pt's wife. Second VM left with call back number.   Demitris Pokorny M

## 2020-11-30 NOTE — Progress Notes (Signed)
OT Cancellation Note  Patient Details Name: Mike Mack MRN: [id number] DOB: June 20, 1935   Cancelled Treatment:    Reason Eval/Treat Not Completed: Patient at procedure or test/ unavailable (Endoscopy). OT will continue to follow acutely and will continue with current POC as schedule allows.   Merri Ray Chrles Selley 11/30/2020, 12:25 PM  Jesse Sans OTR/L Acute Rehabilitation Services Pager: [phone] Office: [phone]

## 2020-11-30 NOTE — Progress Notes (Signed)
Overnight floor coverage progress note  Notified by RN that patient is febrile.  His temperature was checked twice (100.1 > 101.8).  He finished receiving 1 unit PRBCs around 9 PM yesterday evening and spiked a fever approximately 7 hours later around 4 AM this morning.  Remainder of vital signs stable.  No chills or rigors, patient is comfortable.  Hemoglobin 8.5 >8.7.   Likely FNHTR but possible infectious source needs to be ruled out.  -Tylenol as needed for fevers -Stat chest x-ray -Stat UA, lactate, CBC, procalcitonin, blood culture x2 -Start antibiotics if work-up is suggestive of infection.  Patient is currently stable, no tachycardia, tachypnea, or hypotension to suggest sepsis.

## 2020-11-30 NOTE — Progress Notes (Signed)
The patient's temperature at 0352 was 100.1 F orally.  The nurse rechecked his vitals at 0437 and the temperature was up to 101.8 F orally. Other vital signs were normal and patient was arousable and able to communicate.  Tylenol 650 mg was given.  The on call physician was notified and put in orders for new lab draws.  Patient is currently resting comfortably in bed with call light in reach.  Will continue to monitor.  Lupita Dawn, RN

## 2020-11-30 NOTE — Transfer of Care (Signed)
Immediate Anesthesia Transfer of Care Note  Patient: Mike Mack  Procedure(s) Performed: ESOPHAGOGASTRODUODENOSCOPY (EGD) BIOPSY  Patient Location: PACU and Endoscopy Unit  Anesthesia Type:MAC  Level of Consciousness: awake and drowsy  Airway & Oxygen Therapy: Patient Spontanous Breathing  Post-op Assessment: Report given to RN and Post -op Vital signs reviewed and stable  Post vital signs: Reviewed and stable  Last Vitals:  Vitals Value Taken Time  BP    Temp    Pulse    Resp    SpO2      Last Pain:  Vitals:   11/30/20 1130  TempSrc: Temporal  PainSc: 0-No pain         Complications: No notable events documented.

## 2020-11-30 NOTE — TOC Progression Note (Addendum)
Transition of Care [facility]) - Progression Note    Patient Details  Name: Mike Mack MRN: [id number] Date of Birth: [birth year]  [facility]) CM/SW [place], [place] Phone Number: 11/30/2020, 3:58 PM  Clinical Narrative:     Update-CSW tried to start insurance authorization through [employer] portal. CSW was unable to pull patient up. CSW called Mcarthur Rossetti is doing an eligibility check to confirm if patient is managed by [employer] or if they are regular [employer]. CSW awaiting determination .  CSW called and spoke with patients spouse [name] and provided SNF bed offers. Patients spouse chose SNF placement at [facility].CSW called and spoke with Tressa Busman at [facility] who confirmed they can accept patient for SNF placement.   Expected Discharge Plan: [place] Barriers to Discharge: Continued Medical Work up  Expected Discharge Plan and Services Expected Discharge Plan: [place] arrangements for the past 2 months: Single Family Home Expected Discharge Date: 11/27/20                                     Social Determinants of Health (SDOH) Interventions    Readmission Risk Interventions No flowsheet data found.

## 2020-11-30 NOTE — Interval H&P Note (Signed)
History and Physical Interval Note: 84/male with black stools and anemia, ASA use for an EGD today.  11/30/2020 11:30 AM  Mike Mack  has presented today for EGD with the diagnosis of anemia, melena.  The various methods of treatment have been discussed with the patient and family. After consideration of risks, benefits and other options for treatment, the patient has consented to  Procedure(s): ESOPHAGOGASTRODUODENOSCOPY (EGD) (N/A) as a surgical intervention.  The patient's history has been reviewed, patient examined, no change in status, stable for surgery.  I have reviewed the patient's chart and labs.  Questions were answered to the patient's satisfaction.     Ronnette Juniper

## 2020-11-30 NOTE — Anesthesia Preprocedure Evaluation (Signed)
Anesthesia Evaluation  Patient identified by MRN, date of birth, ID band Patient awake    Reviewed: Allergy & Precautions, H&P , NPO status , Patient's Chart, lab work & pertinent test results, reviewed documented beta blocker date and time   Airway Mallampati: II  TM Distance: >3 FB Neck ROM: full    Dental  (+) Edentulous Upper, Edentulous Lower, Dental Advidsory Given   Pulmonary shortness of breath,    breath sounds clear to auscultation       Cardiovascular hypertension, Pt. on medications and Pt. on home beta blockers Normal cardiovascular exam Rhythm:regular Rate:Normal     Neuro/Psych  Headaches, CVA, No Residual Symptoms    GI/Hepatic negative GI ROS, Neg liver ROS,   Endo/Other  diabetes, Well Controlled, Type 2, Insulin DependentHypothyroidism   Renal/GU Renal InsufficiencyRenal disease  negative genitourinary   Musculoskeletal   Abdominal Normal abdominal exam  (+)   Peds  Hematology  (+) anemia ,   Anesthesia Other Findings Conclusions   - Left ventricle: The cavity size was normal. Wall thickness was  normal. Systolic function was normal. The estimated ejection  fraction was in the range of 60% to 65%. Wall motion was normal;  there were no regional wall motion abnormalities. Doppler  parameters are consistent with abnormal left ventricular  relaxation (grade 1 diastolic dysfunction). The E/e&' ratio is  between 8-15, suggesting indeterminate LV filling pressure.  - Left atrium: The atrium was normal in size.   Reproductive/Obstetrics negative OB ROS                             Anesthesia Physical  Anesthesia Plan  ASA: 2  Anesthesia Plan: General   Post-op Pain Management:    Induction: Intravenous  PONV Risk Score and Plan: 4 or greater and Ondansetron  Airway Management Planned: Natural Airway and Mask  Additional Equipment: None  Intra-op Plan:    Post-operative Plan: Extubation in OR  Informed Consent: I have reviewed the patients History and Physical, chart, labs and discussed the procedure including the risks, benefits and alternatives for the proposed anesthesia with the patient or authorized representative who has indicated his/her understanding and acceptance.     Dental Advisory Given  Plan Discussed with: CRNA  Anesthesia Plan Comments:         Anesthesia Quick Evaluation

## 2020-11-30 NOTE — Plan of Care (Signed)
  Problem: Education: Goal: Knowledge of General Education information will improve Description Including pain rating scale, medication(s)/side effects and non-pharmacologic comfort measures Outcome: Progressing   

## 2020-11-30 NOTE — Op Note (Signed)
[facility] Patient Name: Parker Wherley Procedure Date : 11/30/2020 MRN: [id number] Attending MD: Ronnette Juniper , MD Date of Birth: July 03, 1935 CSN: [id number] Age: 85 Admit Type: Inpatient Procedure:                Upper GI endoscopy Indications:              Acute post hemorrhagic anemia, Melena Providers:                Ronnette Juniper, MD, Kary Kos RN, RN, Ladona Ridgel, Technician, Dewitt Hoes, CRNA Referring MD:             [facility], PCP- Jani Gravel Medicines:                Monitored Anesthesia Care Complications:            No immediate complications. Estimated blood loss:                            Minimal. Estimated Blood Loss:     Estimated blood loss was minimal. Procedure:                Pre-Anesthesia Assessment:                           - Prior to the procedure, a History and Physical                            was performed, and patient medications and                            allergies were reviewed. The patient's tolerance of                            previous anesthesia was also reviewed. The risks                            and benefits of the procedure and the sedation                            options and risks were discussed with the patient.                            All questions were answered, and informed consent                            was obtained. Prior Anticoagulants: The patient has                            taken no previous anticoagulant or antiplatelet                            agents except for aspirin. ASA Grade Assessment:  III - A patient with severe systemic disease. After                            reviewing the risks and benefits, the patient was                            deemed in satisfactory condition to undergo the                            procedure.                           After obtaining informed consent, the endoscope was                             passed under direct vision. Throughout the                            procedure, the patient's blood pressure, pulse, and                            oxygen saturations were monitored continuously. The                            GIF-H190 ([id number]) Olympus gastroscope was                            introduced through the mouth, and advanced to the                            second part of duodenum. The upper GI endoscopy was                            accomplished without difficulty. The patient                            tolerated the procedure well. Scope In: Scope Out: Findings:      The examined esophagus was normal.      The Z-line was regular.      One non-bleeding cratered gastric ulcer with a clean ulcer base (Forrest       Class III) was found in the cardia. The lesion was 10 mm in largest       dimension. Biopsies were taken with a cold forceps for Helicobacter       pylori testing.      Mildly erythematous mucosa without bleeding was found in the gastric       antrum. Biopsies were taken with a cold forceps for Helicobacter pylori       testing.      One non-bleeding superficial gastric ulcer was found in the gastric       antrum. The lesion was 3 mm in largest dimension.      One non-bleeding, nodular,polypoid, erythematous but cratered duodenal       ulcer with a clean ulcer base (Forrest Class III) was found in the first       portion of the  duodenum. The lesion was 15 mm in largest dimension.       Biopsies were taken with a cold forceps for histology.      One non-bleeding duodenal ulcer with a clean ulcer base (Forrest Class       III) was found in the first portion of the duodenum. The lesion was 12       mm in largest dimension. Impression:               - Normal esophagus.                           - Z-line regular.                           - Non-bleeding gastric ulcer with a clean ulcer                            base (Forrest Class III). Biopsied.                            - Erythematous mucosa in the antrum. Biopsied.                           - Non-bleeding gastric ulcer.                           - Non-bleeding duodenal ulcer with a clean ulcer                            base (Forrest Class III). Biopsied.                           - Non-bleeding duodenal ulcer with a clean ulcer                            base (Forrest Class III). Moderate Sedation:      Patient did not receive moderate sedation for this procedure, but       instead received monitored anesthesia care. Recommendation:           - Resume regular diet.                           - Continue present medications.                           - Await pathology results.                           - Use Protonix (pantoprazole) 40 mg PO BID for 2                            months.                           - Repeat upper endoscopy in 2 months to check  healing. Procedure Code(s):        --- Professional ---                           [phone], Esophagogastroduodenoscopy, flexible,                            transoral; with biopsy, single or multiple Diagnosis Code(s):        --- Professional ---                           K25.9, Gastric ulcer, unspecified as acute or                            chronic, without hemorrhage or perforation                           K31.89, Other diseases of stomach and duodenum                           K26.9, Duodenal ulcer, unspecified as acute or                            chronic, without hemorrhage or perforation                           D62, Acute posthemorrhagic anemia                           K92.1, Melena (includes Hematochezia) CPT copyright 2019 American Medical Association. All rights reserved. The codes documented in this report are preliminary and upon coder review may  be revised to meet current compliance requirements. Ronnette Juniper, MD 11/30/2020 12:29:04 PM This report has been signed electronically. Number of Addenda:  0

## 2020-11-30 NOTE — Progress Notes (Addendum)
PROGRESS NOTE    Mike Mack   DQQ:[id number]  DOB: 13-Feb-1936  DOA: 11/24/2020 PCP: Mike Gravel, MD   Brief Narrative:  Mike Williams85 year old M with PMH of DM-2, HTN, BPH, CKD-3A, CVA, cognitive decline, HTN, childhood polio with RLE atrophy and weakness and neuropathy presenting with generalized weakness for 2 days, and admitted for AKI with azotemia with Cr to 2.55 from baseline of 1.3 and BUN in 80s.  Patient was started on IV fluids and improved. On 6/13 he had a black BM and became more anemia. GI called, EGD reveals 4 ulcers in stomach and duodenum. Cont to follow today for acute blood loss and follow oral intake which has been poor.   Subjective: He has no complaints other than feeling cold.     Assessment & Plan:   Hospital Course:  Principle problem:  AKI on CKD-3A: Baseline Cr ranges from 1.1-1.4.   Likely prerenal and treated with IV fluids -Renal function improved from 2.55- 1.44 - ? If poor oral intake was related to the 4 ulcers that were found in his GI tract today - follow oral intake- I have asked RNs to document oral intake.  - resume NS at 75 cc/hr while watching oral intake today    Active problems:  Melena- acute blood loss anemia/ AOCD - Hb was stable at 8-9 range - Anemia panel consistent with anemia of chronic disease - black stool documented by tech on 6/13 AM which I noted when I was reviewing flow sheets - hemoccult found to be positive, he had a second black stool and Hb noted to have dropped from 8.3 to 7.3 _ requested GI consult, NPO, Protonix bolus and infusion, d/c ASA and Heparin (DVT) & Metoprolol - 1 U PRBC ordered  - 6/14> s/p EGD he has 2 ulcers in the stomach and 2 in the duodenum- biopsies done - start regular diet, change to BID protonix- follow for re bleeding and follow oral intake - hold ASA again today  Fever of 101.8 last night occurring after blood transfusion - UA negative - CXR clear -- blood cultures done last night  and pending -follow temps  Ambulatory dysfunction/generalized weakness and left leg affected by Polio Patient uses walker at baseline. Now maximum assist with 2 person per therapy.    -MRI brain negative for acute findings.  Ventricular prominence could reflect NPH, multiple remote cerebellar, lacunar infarct involving left thalamus. - will go to SNF when stable    IDDM-2 with hyperglycemia and neuropathy:  -Well-controlled.  A1c 6.5% -Actos d/c'd on 6/11  - have stopped Levemir and NovoLog 3 units 3 times daily with meals due to hypoglycemia 6/12- Glucose stable - cont SSI- place on regular diet due to poor oral intake -Continue gabapentin   Essential hypertension:  -Continue metoprolol   Dementia -Continue Namenda- oriented only to person- no behavioral issues   BPH -Continue Flomax   Hypothyroidism -Continue home Synthroid   Chronic constipation: -Continue MiraLAX       Time spent in minutes: 30 DVT prophylaxis: Place and maintain sequential compression device Start: 11/29/20 1459Place and maintain sequential compression device Start: 11/29/20 1459 SCD Code Status: Full code Family Communication: wife- number on Face Sheet was wrong - her number is > [phone] Level of Care: Level of care: Progressive Disposition Plan:  Status is: Inpatient  Remains inpatient appropriate because:Unsafe d/c plan and Inpatient level of care appropriate due to severity of illness  Dispo: The patient is from: Home  Anticipated d/c is to: SNF in 1-2              Patient currently is not medically stable to d/c.   Difficult to place patient No      Consultants:  GI Procedures:   EGD Antimicrobials:  Anti-infectives (From admission, onward)    None        Objective: Vitals:   11/30/20 1130 11/30/20 1229 11/30/20 1239 11/30/20 1258  BP: (!) 186/59 (!) 113/46 (!) 148/54 (!) 157/67  Pulse: 92 96 95 94  Resp: 18 19 18 15   Temp: (!) 97.5 F (36.4 C) 99.5 F  (37.5 C)  99.8 F (37.7 C)  TempSrc: Temporal Temporal  Oral  SpO2: 100% 100% 98% 97%  Weight: 84.5 kg     Height: 5\' 5"  (1.651 m)       Intake/Output Summary (Last 24 hours) at 11/30/2020 1341 Last data filed at 11/30/2020 1222 Gross per 24 hour  Intake 1656.96 ml  Output 1300 ml  Net 356.96 ml    Filed Weights   11/25/20 0513 11/30/20 1130  Weight: 84.5 kg 84.5 kg    Examination: General exam: Appears comfortable  HEENT: PERRLA, oral mucosa moist, no sclera icterus or thrush Respiratory system: Clear to auscultation. Respiratory effort normal. Cardiovascular system: S1 & S2 heard, regular rate and rhythm Gastrointestinal system: Abdomen soft, non-tender, nondistended. Normal bowel sounds   Central nervous system: Alert and oriented only to person. No focal neurological deficits. Extremities: No cyanosis, clubbing or edema Skin: No rashes or ulcers Psychiatry:  Mood & affect appropriate.      Data Reviewed: I have personally reviewed following labs and imaging studies  CBC: Recent Labs  Lab 11/24/20 1145 11/25/20 0553 11/27/20 0043 11/29/20 1248 11/29/20 1735 11/30/20 0108 11/30/20 0535  WBC 7.2   < > 9.0 10.6* 12.0* 11.7* 10.1  NEUTROABS 5.0  --   --   --   --   --   --   HGB 9.1*   < > 8.3* 7.3* 8.5* 8.7* 8.5*  HCT 29.0*   < > 26.2* 21.8* 25.6* 26.0* 25.4*  MCV 99.7   < > 101.2* 96.5 96.6 93.2 94.8  PLT 225   < > 177 257 281 269 264   < > = values in this interval not displayed.    Basic Metabolic Panel: Recent Labs  Lab 11/25/20 0553 11/26/20 0146 11/27/20 0043 11/28/20 1503 11/30/20 0535  NA 139 139 135 134* 133*  K 4.4 4.5 5.2* 4.7 4.5  CL 109 111 108 106 107  CO2 21* 24 17* 24 22  GLUCOSE 217* 131* 129* 127* 118*  BUN 71* 50* 32* 22 22  CREATININE 2.08* 1.63* 1.47* 1.44* 1.46*  CALCIUM 7.9* 8.6* 8.2* 8.2* 8.1*  MG  --  2.6*  --   --   --   PHOS  --  2.2*  --   --   --     GFR: Estimated Creatinine Clearance: 39 mL/min (A) (by C-G  formula based on SCr of 1.46 mg/dL (H)). Liver Function Tests: Recent Labs  Lab 11/24/20 1145 11/26/20 0146  AST 18  --   ALT 19  --   ALKPHOS 109  --   BILITOT 0.1*  --   PROT 6.9  --   ALBUMIN 2.9* 2.6*    Recent Labs  Lab 11/24/20 1145  LIPASE 46    No results for input(s): AMMONIA in the last 168 hours. Coagulation Profile: No results  for input(s): INR, PROTIME in the last 168 hours. Cardiac Enzymes: No results for input(s): CKTOTAL, CKMB, CKMBINDEX, TROPONINI in the last 168 hours. BNP (last 3 results) No results for input(s): PROBNP in the last 8760 hours. HbA1C: No results for input(s): HGBA1C in the last 72 hours. CBG: Recent Labs  Lab 11/29/20 1151 11/29/20 1723 11/29/20 2111 11/30/20 0628 11/30/20 1305  GLUCAP 187* 189* 177* 116* 102*    Lipid Profile: No results for input(s): CHOL, HDL, LDLCALC, TRIG, CHOLHDL, LDLDIRECT in the last 72 hours. Thyroid Function Tests: No results for input(s): TSH, T4TOTAL, FREET4, T3FREE, THYROIDAB in the last 72 hours. Anemia Panel: No results for input(s): VITAMINB12, FOLATE, FERRITIN, TIBC, IRON, RETICCTPCT in the last 72 hours. Urine analysis:    Component Value Date/Time   COLORURINE YELLOW 11/24/2020 2200   APPEARANCEUR HAZY (A) 11/24/2020 2200   LABSPEC 1.018 11/24/2020 2200   PHURINE 5.0 11/24/2020 2200   GLUCOSEU NEGATIVE 11/24/2020 2200   HGBUR SMALL (A) 11/24/2020 2200   BILIRUBINUR NEGATIVE 11/24/2020 2200   KETONESUR NEGATIVE 11/24/2020 2200   PROTEINUR NEGATIVE 11/24/2020 2200   UROBILINOGEN 0.2 10/25/2014 1520   NITRITE NEGATIVE 11/24/2020 2200   LEUKOCYTESUR SMALL (A) 11/24/2020 2200   Sepsis Labs: @LABRCNTIP (procalcitonin:4,lacticidven:4) ) Recent Results (from the past 240 hour(s))  Resp Panel by RT-PCR (Flu A&B, Covid) Nasopharyngeal Swab     Status: None   Collection Time: 11/25/20 12:56 AM   Specimen: Nasopharyngeal Swab; Nasopharyngeal(NP) swabs in vial transport medium  Result Value  Ref Range Status   SARS Coronavirus 2 by RT PCR NEGATIVE NEGATIVE Final    Comment: (NOTE) SARS-CoV-2 target nucleic acids are NOT DETECTED.  The SARS-CoV-2 RNA is generally detectable in upper respiratory specimens during the acute phase of infection. The lowest concentration of SARS-CoV-2 viral copies this assay can detect is 138 copies/mL. A negative result does not preclude SARS-Cov-2 infection and should not be used as the sole basis for treatment or other patient management decisions. A negative result may occur with  improper specimen collection/handling, submission of specimen other than nasopharyngeal swab, presence of viral mutation(s) within the areas targeted by this assay, and inadequate number of viral copies(<138 copies/mL). A negative result must be combined with clinical observations, patient history, and epidemiological information. The expected result is Negative.  Fact Sheet for Patients:  EntrepreneurPulse.com.au  Fact Sheet for Healthcare Providers:  IncredibleEmployment.be  This test is no t yet approved or cleared by the [place] FDA and  has been authorized for detection and/or diagnosis of SARS-CoV-2 by FDA under an Emergency Use Authorization (EUA). This EUA will remain  in effect (meaning this test can be used) for the duration of the COVID-19 declaration under Section 564(b)(1) of the Act, 21 U.S.C.section 360bbb-3(b)(1), unless the authorization is terminated  or revoked sooner.       Influenza A by PCR NEGATIVE NEGATIVE Final   Influenza B by PCR NEGATIVE NEGATIVE Final    Comment: (NOTE) The Xpert Xpress SARS-CoV-2/FLU/RSV plus assay is intended as an aid in the diagnosis of influenza from Nasopharyngeal swab specimens and should not be used as a sole basis for treatment. Nasal washings and aspirates are unacceptable for Xpert Xpress SARS-CoV-2/FLU/RSV testing.  Fact Sheet for  Patients: EntrepreneurPulse.com.au  Fact Sheet for Healthcare Providers: IncredibleEmployment.be  This test is not yet approved or cleared by the [place] FDA and has been authorized for detection and/or diagnosis of SARS-CoV-2 by FDA under an Emergency Use Authorization (EUA). This EUA will remain  in effect (meaning this test can be used) for the duration of the COVID-19 declaration under Section 564(b)(1) of the Act, 21 U.S.C. section 360bbb-3(b)(1), unless the authorization is terminated or revoked.  Performed at [facility], [place] [address], [place], [place] [address]          Radiology Studies: DG CHEST PORT 1 VIEW  Result Date: 11/30/2020 CLINICAL DATA:  85 year old male with fever. EXAM: PORTABLE CHEST 1 VIEW COMPARISON:  Portable chest 03/19/2020 and earlier. CT Abdomen and Pelvis 04/06/2020 and earlier. FINDINGS: Portable AP semi upright view at 0554 hours. Low lung volumes with bibasilar pleural effusions, not significantly changed since October. Visible mediastinal contours remain normal. Visualized tracheal air column is within normal limits. No pneumothorax or pulmonary edema. No air bronchograms. Paucity of bowel gas in the upper abdomen. IMPRESSION: Low lung volumes and small bilateral pleural effusions, not significantly changed since October. Electronically Signed   By: Genevie Ann M.D.   On: 11/30/2020 06:15      Scheduled Meds:  atorvastatin  20 mg Oral QPM   brimonidine  1 drop Both Eyes BID   dorzolamide-timolol  1 drop Both Eyes BID   gabapentin  600 mg Oral BID   insulin aspart  0-5 Units Subcutaneous QHS   insulin aspart  0-9 Units Subcutaneous TID WC   latanoprost  1 drop Both Eyes QHS   levothyroxine  88 mcg Oral QHS   memantine  10 mg Oral BID   multivitamin with minerals  1 tablet Oral Daily   Netarsudil Dimesylate  1 drop Both Eyes QHS   pantoprazole  40 mg Intravenous Q12H   tamsulosin  0.4 mg Oral BID    Continuous Infusions:  sodium chloride 75 mL/hr at 11/30/20 0504   lactated ringers Stopped (11/30/20 1236)   pantoprazole       LOS: 5 days      Debbe Odea, MD [facility] Pager: [email or web address] 11/30/2020, 1:41 PM

## 2020-11-30 NOTE — Anesthesia Procedure Notes (Signed)
Procedure Name: MAC Date/Time: 11/30/2020 12:10 PM Performed by: Trinna Post., CRNA Pre-anesthesia Checklist: Patient identified, Emergency Drugs available, Suction available, Patient being monitored and Timeout performed Patient Re-evaluated:Patient Re-evaluated prior to induction Oxygen Delivery Method: Nasal cannula Preoxygenation: Pre-oxygenation with 100% oxygen Induction Type: IV induction Placement Confirmation: positive ETCO2

## 2020-12-01 LAB — BASIC METABOLIC PANEL
Anion gap: 7 (ref 5–15)
BUN: 20 mg/dL (ref 8–23)
CO2: 23 mmol/L (ref 22–32)
Calcium: 8.2 mg/dL — ABNORMAL LOW (ref 8.9–10.3)
Chloride: 104 mmol/L (ref 98–111)
Creatinine, Ser: 1.39 mg/dL — ABNORMAL HIGH (ref 0.61–1.24)
GFR, Estimated: 51 mL/min — ABNORMAL LOW (ref >60)
Glucose, Bld: 167 mg/dL — ABNORMAL HIGH (ref 70–99)
Potassium: 4.7 mmol/L (ref 3.5–5.1)
Sodium: 134 mmol/L — ABNORMAL LOW (ref 135–145)

## 2020-12-01 LAB — GLUCOSE, CAPILLARY
Glucose-Capillary: 145 mg/dL — ABNORMAL HIGH (ref 70–99)
Glucose-Capillary: 177 mg/dL — ABNORMAL HIGH (ref 70–99)
Glucose-Capillary: 208 mg/dL — ABNORMAL HIGH (ref 70–99)
Glucose-Capillary: 226 mg/dL — ABNORMAL HIGH (ref 70–99)

## 2020-12-01 LAB — CBC
HCT: 26 % — ABNORMAL LOW (ref 39.0–52.0)
Hemoglobin: 8.7 g/dL — ABNORMAL LOW (ref 13.0–17.0)
MCH: 31.8 pg (ref 26.0–34.0)
MCHC: 33.5 g/dL (ref 30.0–36.0)
MCV: 94.9 fL (ref 80.0–100.0)
Platelets: 279 10*3/uL (ref 150–400)
RBC: 2.74 MIL/uL — ABNORMAL LOW (ref 4.22–5.81)
RDW: 14.4 % (ref 11.5–15.5)
WBC: 12.7 10*3/uL — ABNORMAL HIGH (ref 4.0–10.5)
nRBC: 0 % (ref 0.0–0.2)

## 2020-12-01 LAB — RESP PANEL BY RT-PCR (FLU A&B, COVID) ARPGX2
Influenza A by PCR: NEGATIVE
Influenza B by PCR: NEGATIVE
SARS Coronavirus 2 by RT PCR: NEGATIVE

## 2020-12-01 LAB — SURGICAL PATHOLOGY

## 2020-12-01 MED ORDER — PANTOPRAZOLE SODIUM 40 MG PO TBEC
40.0000 mg | DELAYED_RELEASE_TABLET | Freq: Two times a day (BID) | ORAL | Status: DC
  Administered 2020-12-02 – 2020-12-09 (×16): 40 mg via ORAL
  Filled 2020-12-01 (×16): qty 1

## 2020-12-01 MED ORDER — SODIUM CHLORIDE 0.9 % IV SOLN
1.0000 g | INTRAVENOUS | Status: DC
  Administered 2020-12-01 – 2020-12-03 (×3): 1 g via INTRAVENOUS
  Filled 2020-12-01 (×4): qty 10

## 2020-12-01 NOTE — Progress Notes (Signed)
Occupational Therapy Treatment Patient Details Name: Mike Mack MRN: [id number] DOB: 09/02/1935 Today's Date: 12/01/2020    History of present illness Pt presented to ED 6/8 with weakness and inability to ambulate. Pt found to have AKI. PMH - polio with chronic weakness of RLE, DM, HTN, anemia, CVA   OT comments  Pt progressing gradually towards goals, agreeable to participate in OOB attempts. Pt continues to require heavy assist for bed mobility with noted posterior/R lateral lean and reliant on UE support EOB. Guided pt in OOB transfer  via Stedy with Max A x 2 required as pt fatiguing. Pt also noted with bowel incontinence after transfer and Total A x 2 needed for cleanup while standing in [place]. Wife at bedside and supportive throughout session. Both eager for pt to regain strength through rehab. Continue to recommend SNF.    Follow Up Recommendations  SNF    Equipment Recommendations  3 in 1 bedside commode    Recommendations for Other Services      Precautions / Restrictions Precautions Precautions: Fall Precaution Comments: chronic R LE dysfunction d/t childhood polio Restrictions Weight Bearing Restrictions: No       Mobility Bed Mobility Overal bed mobility: Needs Assistance Bed Mobility: Supine to Sit     Supine to sit: HOB elevated;Max assist     General bed mobility comments: max directional verbal cues, maxA for trunk elevation and LE advancement with posterior lean noted    Transfers Overall transfer level: Needs assistance Equipment used: Ambulation equipment used Transfers: Sit to/from Stand Sit to Stand: Max assist;+2 physical assistance;+2 safety/equipment         General transfer comment: Initially heavy Mod A x 2 for sit to stand from bedside, Max A x 2 from recliner. Increased assist needed due to fatigue. transferred from bed to chair via Stedy    Balance Overall balance assessment: Needs assistance Sitting-balance support: Feet  supported Sitting balance-Leahy Scale: Poor Sitting balance - Comments: UE support Postural control: Left lateral lean;Posterior lean Standing balance support: Bilateral upper extremity supported Standing balance-Leahy Scale: Poor                             ADL either performed or assessed with clinical judgement   ADL Overall ADL's : Needs assistance/impaired                             Toileting- Clothing Manipulation and Hygiene: Total assistance;+2 for physical assistance;+2 for safety/equipment;Sit to/from stand Toileting - Clothing Manipulation Details (indicate cue type and reason): Noted with bowel incontinence after transfer, +2 assist in [place] for cleanup       General ADL Comments: Session focused on WB through BLE in prep for ADL transfers     Vision   Vision Assessment?: No apparent visual deficits   Perception     Praxis      Cognition Arousal/Alertness: Awake/alert Behavior During Therapy: Flat affect Overall Cognitive Status: Impaired/Different from baseline Area of Impairment: Memory;Following commands;Problem solving                     Memory: Decreased short-term memory Following Commands: Follows one step commands consistently;Follows one step commands with increased time     Problem Solving: Slow processing;Decreased initiation;Difficulty sequencing;Requires verbal cues;Requires tactile cues General Comments: baseline cognitive impairment per chart. able to follow directions, responds appropriately. some impaired memory but wife assists in  providing background info as needed        Exercises     Shoulder Instructions       General Comments VSS on RA. Wife at bedside and supportive, engaged in assisting pt with needs    Pertinent Vitals/ Pain       Pain Assessment: Faces Faces Pain Scale: Hurts a little bit Pain Location: R LE with weightbearing Pain Descriptors / Indicators: Grimacing;Guarding Pain  Intervention(s): Monitored during session;Repositioned  Home Living                                          Prior Functioning/Environment              Frequency  Min 2X/week        Progress Toward Goals  OT Goals(current goals can now be found in the care plan section)  Progress towards OT goals: OT to reassess next treatment  Acute Rehab OT Goals Patient Stated Goal: increase strength and ability to stand OT Goal Formulation: With patient Time For Goal Achievement: 12/09/20 Potential to Achieve Goals: Good ADL Goals Pt Will Perform Grooming: with set-up;sitting Pt Will Perform Upper Body Bathing: with set-up;sitting Pt Will Perform Upper Body Dressing: with set-up;sitting Pt Will Transfer to Toilet: with min guard assist;stand pivot transfer;bedside commode Pt Will Perform Toileting - Clothing Manipulation and hygiene: with min assist;sit to/from stand  Plan Discharge plan remains appropriate    Co-evaluation                 AM-PAC OT "6 Clicks" Daily Activity     Outcome Measure   Help from another person eating meals?: A Little Help from another person taking care of personal grooming?: A Little Help from another person toileting, which includes using toliet, bedpan, or urinal?: Total Help from another person bathing (including washing, rinsing, drying)?: A Lot Help from another person to put on and taking off regular upper body clothing?: A Little Help from another person to put on and taking off regular lower body clothing?: Total 6 Click Score: 13    End of Session Equipment Utilized During Treatment: Gait belt  OT Visit Diagnosis: Muscle weakness (generalized) (M62.81);History of falling (Z91.81);Other symptoms and signs involving cognitive function   Activity Tolerance Patient tolerated treatment well   Patient Left in chair;with call bell/phone within reach;with chair alarm set   Nurse Communication Mobility status         Time: 6659-9357 OT Time Calculation (min): 28 min  Charges: OT General Charges $OT Visit: 1 Visit OT Treatments $Self Care/Home Management : 8-22 mins $Therapeutic Activity: 8-22 mins  [name], OTR/L Acute Rehab Services Office: [phone]    [name] 12/01/2020, 11:55 AM

## 2020-12-01 NOTE — Progress Notes (Signed)
The patient had a temperature of 103.1 F orally.  Other vitals were within normal range.  The nurse gave 650 mg of Tylenol and paged the on call physician.  The doctor ordered a repeat of blood cultures to be drawn.  Will continue to monitor.  Lupita Dawn, RN

## 2020-12-01 NOTE — TOC Progression Note (Addendum)
Transition of Care [facility]) - Progression Note    Patient Details  Name: Nalu Troublefield MRN: [id number] Date of Birth: 31-Jul-1935  Transition of Care [facility]) CM/SW [place], [place] Phone Number: 12/01/2020, 10:49 AM  Clinical Narrative:     CSW called Rolla Plate with [name]; left VM requesting that they start auth today. CSW requested return call/text to confirm.   CSW contacted PT/OT and requested pt be prioritized so updated clinicals can be included in SNF auth request.   1093: Logan confirmed with CSW that she started auth   Expected Discharge Plan: [place] Barriers to Discharge: Continued Medical Work up  Expected Discharge Plan and Services Expected Discharge Plan: [place] arrangements for the past 2 months: Single Family Home Expected Discharge Date: 11/27/20                                     Social Determinants of Health (SDOH) Interventions    Readmission Risk Interventions No flowsheet data found.

## 2020-12-01 NOTE — Progress Notes (Signed)
Physical Therapy Treatment Patient Details Name: Mike Mack MRN: [id number] DOB: [birth year] Today's Date: 12/01/2020    History of Present Illness Pt presented to ED 6/8 with weakness and inability to ambulate. Pt found to have AKI. PMH - polio with chronic weakness of RLE, DM, HTN, anemia, CVA    PT Comments    Pt received in chair, agreeable to therapy session and with good participation and tolerance for transfer training and seated/supine therapeutic exercise instruction. Pt noted to have significantly weaker RLE (hx of polio) but especially with poor/trace R quad contraction, he may benefit from knee immoblizer/hinged knee brace which can lock to progress standing tolerance safely (did better with therapist blocking R knee in stance). Pt needs +2 mod to maxA for sit<>stand and seated scoot anterior/posterior in chair. Pt wanting to remain up in chair for lunch so deferred bed mobility. Will plan to progress standing/pre-gait activities next session. Pt continues to benefit from PT services to progress toward functional mobility goals.   Follow Up Recommendations  SNF     Equipment Recommendations  None recommended by PT    Recommendations for Other Services       Precautions / Restrictions Precautions Precautions: Fall Precaution Comments: chronic R LE dysfunction d/t childhood polio Required Braces or Orthoses: Other Brace (he may benefit from hinged lockable knee brace in future for RLE (significant quad weakness)) Restrictions Weight Bearing Restrictions: No    Mobility  Bed Mobility Overal bed mobility: Needs Assistance Bed Mobility: Supine to Sit     Supine to sit: HOB elevated;Max assist     General bed mobility comments: pt up in chair and requesting to remain in chair after session    Transfers Overall transfer level: Needs assistance Equipment used: Rolling walker (2 wheeled) Transfers: Sit to/from Stand Sit to Stand: Max assist;+2 physical  assistance;Mod assist;+2 safety/equipment         General transfer comment: Initially heavy Mod A +2 for sit to stand from recliner to RW, then on seccond attempt Max A +2 from recliner. Increased assist needed due to fatigue/RLE buckling. Pt wanting to remain in chair so deferred transfer back to bed.  Ambulation/Gait             General Gait Details: Unable to due to weakness/RLE buckling   Stairs             Wheelchair Mobility    Modified Rankin (Stroke Patients Only)       Balance Overall balance assessment: Needs assistance Sitting-balance support: Feet supported Sitting balance-Leahy Scale: Poor Sitting balance - Comments: UE support needed seated forward in chair Postural control: Posterior lean Standing balance support: Bilateral upper extremity supported Standing balance-Leahy Scale: Zero Standing balance comment: pt needs +2 maxA for standing balance at RW and RLE blocked to remain upright >5 seconds, fatigues within 15-20 seconds in static stance                            Cognition Arousal/Alertness: Awake/alert Behavior During Therapy: Flat affect Overall Cognitive Status: Impaired/Different from baseline Area of Impairment: Memory;Following commands;Problem solving                     Memory: Decreased short-term memory Following Commands: Follows one step commands consistently;Follows one step commands with increased time     Problem Solving: Slow processing;Decreased initiation;Difficulty sequencing;Requires verbal cues;Requires tactile cues General Comments: baseline cognitive impairment per chart. able to follow directions,  responds appropriately. some impaired memory but wife assists in providing background info as needed.      Exercises Other Exercises Other Exercises: seated BLE AAROM: hip flexion, hip ADduction pillow squeezes, LAQ, ankle pumps (AROM) x10 reps ea Other Exercises: reclined BLE AAROM: heel slides,  hip abduction x10 reps ea    General Comments General comments (skin integrity, edema, etc.): BP 137/60 (84) prior to standing and no dizziness reported. HR 86-94 bpm and SpO2 98% on RA      Pertinent Vitals/Pain Pain Assessment: No/denies pain Faces Pain Scale: Hurts a little bit Pain Location: R LE with weightbearing Pain Descriptors / Indicators: Grimacing;Guarding Pain Intervention(s): Monitored during session     PT Goals (current goals can now be found in the care plan section) Acute Rehab PT Goals Patient Stated Goal: increase strength and ability to stand PT Goal Formulation: With patient Time For Goal Achievement: 12/09/20 Progress towards PT goals: Progressing toward goals    Frequency    Min 3X/week      PT Plan Current plan remains appropriate       AM-PAC PT "6 Clicks" Mobility   Outcome Measure  Help needed turning from your back to your side while in a flat bed without using bedrails?: A Little Help needed moving from lying on your back to sitting on the side of a flat bed without using bedrails?: A Lot Help needed moving to and from a bed to a chair (including a wheelchair)?: Total Help needed standing up from a chair using your arms (e.g., wheelchair or bedside chair)?: A Lot Help needed to walk in hospital room?: Total Help needed climbing 3-5 steps with a railing? : Total 6 Click Score: 10    End of Session Equipment Utilized During Treatment: Gait belt Activity Tolerance: Patient tolerated treatment well Patient left: in chair;with call bell/phone within reach;with chair alarm set;with family/visitor present Nurse Communication: Mobility status;Need for lift equipment;Other (comment) (Stedy vs hoyer if too fatigued to get Stedy flaps in place) PT Visit Diagnosis: Unsteadiness on feet (R26.81);Other abnormalities of gait and mobility (R26.89);Muscle weakness (generalized) (M62.81)     Time: 4315-4008 PT Time Calculation (min) (ACUTE ONLY): 24  min  Charges:  $Therapeutic Exercise: 8-22 mins $Therapeutic Activity: 8-22 mins                     Mordechai Matuszak P., PTA Acute Rehabilitation Services Pager: [phone] Office: [place] 12/01/2020, 1:53 PM

## 2020-12-01 NOTE — Progress Notes (Signed)
[facility] PROGRESS NOTE    Wylee Dorantes  JJH:[id number] DOB: 02-14-1936 DOA: 11/24/2020 PCP: Jani Gravel, MD      Brief Narrative:  [name] is a 85 y.o. M with DM, HTN, BPH, CKD 3A, CVA, cognitive decline, HTN, childhood polio with RLE atrophy and weakness and neuropathy presented with generalized weakness for 2 days, then found to have AKI with azotemia and creatinine up to 2.5 from a baseline of 1.3 and BUN in the 80s.  Patient was initially put on IV fluids and improved.  On 6/13 he had black bowel movement and became more anemic.  GI were consulted, and EGD showed 4 ulcers in the stomach and duodenum.         Assessment & Plan:  Fever Patient complains acme of dysuria - Check COVID test - Obtain urine culture - Empiric Rocephin for now  Acute kidney injury on CKD stage IIIa - Resolved to baseline  Diabetes - Continue atorvastatin - Continue sliding scale corrections   BPH - Continue tamsulosin  Dementia - Continue memantine  Hypothyroidism - Continue levothyroxine  Acute blood loss anemia on anemia of chronic disease No further clinical bleeding Hemoglobin stable  GI bleed - Continue PPI         Disposition: Status is: Inpatient  Remains inpatient appropriate because:Unsafe d/c plan  Dispo: The patient is from: Home              Anticipated d/c is to: SNF              Patient currently is not medically stable to d/c.   Difficult to place patient No       Level of care: Med-Surg       MDM: The below labs and imaging reports were reviewed and summarized above.  Medication management as above.     DVT prophylaxis: Place and maintain sequential compression device Start: 11/29/20 1459  Code Status: FULL Family Communication: wife at the bedisde            Subjective: Fever again overnight.  Complaints may have dysuria.  No cough, sputum.    Objective: Vitals:   12/01/20 0505 12/01/20  0822 12/01/20 1109 12/01/20 1644  BP: (!) 103/49 136/67 137/60 (!) 157/79  Pulse: 70 88 77 97  Resp: 12 16 16 19   Temp: 99.6 F (37.6 C) 98.9 F (37.2 C)  100 F (37.8 C)  TempSrc: Oral Oral Oral Oral  SpO2: 96% 96% 96% 97%  Weight:      Height:        Intake/Output Summary (Last 24 hours) at 12/01/2020 1849 Last data filed at 12/01/2020 1229 Gross per 24 hour  Intake 1338.49 ml  Output 800 ml  Net 538.49 ml   Filed Weights   11/25/20 0513 11/30/20 1130  Weight: 84.5 kg 84.5 kg    Examination: General appearance: Elderly adult male, alert and in no acute distress.   HEENT: Anicteric, conjunctiva pink, lids and lashes normal. No nasal deformity, discharge, epistaxis.  Lips moist, edentulous, oropharynx moist, no oral lesions, hearing diminished.   Skin: Warm and dry.    No suspicious rashes or lesions. Cardiac: RRR, nl S1-S2, no murmurs appreciated.  Capillary refill is brisk.  JVP normal No LE edema.  Radial  pulses 2+ and symmetric. Respiratory: Normal respiratory rate and rhythm.  CTAB without rales or wheezes. Abdomen: Abdomen soft.  no TTP. No ascites, distension, hepatosplenomegaly.   MSK: No deformities or effusions. Neuro: Awake  and alert.  EOMI, moves all extremities with severe generalized weakness. Speech fluent.    Psych: Sensorium intact and responding to questions, attention normal. Affect blunted.  Judgment and insight appear severely impaired.    Data Reviewed: I have personally reviewed following labs and imaging studies:  CBC: Recent Labs  Lab 11/29/20 1248 11/29/20 1735 11/30/20 0108 11/30/20 0535 12/01/20 0136  WBC 10.6* 12.0* 11.7* 10.1 12.7*  HGB 7.3* 8.5* 8.7* 8.5* 8.7*  HCT 21.8* 25.6* 26.0* 25.4* 26.0*  MCV 96.5 96.6 93.2 94.8 94.9  PLT 257 281 269 264 542   Basic Metabolic Panel: Recent Labs  Lab 11/26/20 0146 11/27/20 0043 11/28/20 1503 11/30/20 0535 12/01/20 0136  NA 139 135 134* 133* 134*  K 4.5 5.2* 4.7 4.5 4.7  CL 111 108  106 107 104  CO2 24 17* 24 22 23   GLUCOSE 131* 129* 127* 118* 167*  BUN 50* 32* 22 22 20   CREATININE 1.63* 1.47* 1.44* 1.46* 1.39*  CALCIUM 8.6* 8.2* 8.2* 8.1* 8.2*  MG 2.6*  --   --   --   --   PHOS 2.2*  --   --   --   --    GFR: Estimated Creatinine Clearance: 41 mL/min (A) (by C-G formula based on SCr of 1.39 mg/dL (H)). Liver Function Tests: Recent Labs  Lab 11/26/20 0146  ALBUMIN 2.6*   No results for input(s): LIPASE, AMYLASE in the last 168 hours. No results for input(s): AMMONIA in the last 168 hours. Coagulation Profile: No results for input(s): INR, PROTIME in the last 168 hours. Cardiac Enzymes: No results for input(s): CKTOTAL, CKMB, CKMBINDEX, TROPONINI in the last 168 hours. BNP (last 3 results) No results for input(s): PROBNP in the last 8760 hours. HbA1C: No results for input(s): HGBA1C in the last 72 hours. CBG: Recent Labs  Lab 11/30/20 1713 11/30/20 2114 12/01/20 0609 12/01/20 1110 12/01/20 1646  GLUCAP 148* 232* 145* 177* 208*   Lipid Profile: No results for input(s): CHOL, HDL, LDLCALC, TRIG, CHOLHDL, LDLDIRECT in the last 72 hours. Thyroid Function Tests: No results for input(s): TSH, T4TOTAL, FREET4, T3FREE, THYROIDAB in the last 72 hours. Anemia Panel: No results for input(s): VITAMINB12, FOLATE, FERRITIN, TIBC, IRON, RETICCTPCT in the last 72 hours. Urine analysis:    Component Value Date/Time   COLORURINE YELLOW 11/30/2020 1635   APPEARANCEUR CLEAR 11/30/2020 1635   LABSPEC 1.013 11/30/2020 1635   PHURINE 6.0 11/30/2020 1635   GLUCOSEU NEGATIVE 11/30/2020 1635   HGBUR SMALL (A) 11/30/2020 1635   BILIRUBINUR NEGATIVE 11/30/2020 1635   KETONESUR 5 (A) 11/30/2020 1635   PROTEINUR 30 (A) 11/30/2020 1635   UROBILINOGEN 0.2 10/25/2014 1520   NITRITE NEGATIVE 11/30/2020 1635   LEUKOCYTESUR NEGATIVE 11/30/2020 1635   Sepsis Labs: @LABRCNTIP (procalcitonin:4,lacticacidven:4)  ) Recent Results (from the past 240 hour(s))  Resp Panel by  RT-PCR (Flu A&B, Covid) Nasopharyngeal Swab     Status: None   Collection Time: 11/25/20 12:56 AM   Specimen: Nasopharyngeal Swab; Nasopharyngeal(NP) swabs in vial transport medium  Result Value Ref Range Status   SARS Coronavirus 2 by RT PCR NEGATIVE NEGATIVE Final    Comment: (NOTE) SARS-CoV-2 target nucleic acids are NOT DETECTED.  The SARS-CoV-2 RNA is generally detectable in upper respiratory specimens during the acute phase of infection. The lowest concentration of SARS-CoV-2 viral copies this assay can detect is 138 copies/mL. A negative result does not preclude SARS-Cov-2 infection and should not be used as the sole basis for treatment or other  patient management decisions. A negative result may occur with  improper specimen collection/handling, submission of specimen other than nasopharyngeal swab, presence of viral mutation(s) within the areas targeted by this assay, and inadequate number of viral copies(<138 copies/mL). A negative result must be combined with clinical observations, patient history, and epidemiological information. The expected result is Negative.  Fact Sheet for Patients:  EntrepreneurPulse.com.au  Fact Sheet for Healthcare Providers:  IncredibleEmployment.be  This test is no t yet approved or cleared by the [place] FDA and  has been authorized for detection and/or diagnosis of SARS-CoV-2 by FDA under an Emergency Use Authorization (EUA). This EUA will remain  in effect (meaning this test can be used) for the duration of the COVID-19 declaration under Section 564(b)(1) of the Act, 21 U.S.C.section 360bbb-3(b)(1), unless the authorization is terminated  or revoked sooner.       Influenza A by PCR NEGATIVE NEGATIVE Final   Influenza B by PCR NEGATIVE NEGATIVE Final    Comment: (NOTE) The Xpert Xpress SARS-CoV-2/FLU/RSV plus assay is intended as an aid in the diagnosis of influenza from Nasopharyngeal swab  specimens and should not be used as a sole basis for treatment. Nasal washings and aspirates are unacceptable for Xpert Xpress SARS-CoV-2/FLU/RSV testing.  Fact Sheet for Patients: EntrepreneurPulse.com.au  Fact Sheet for Healthcare Providers: IncredibleEmployment.be  This test is not yet approved or cleared by the [place] FDA and has been authorized for detection and/or diagnosis of SARS-CoV-2 by FDA under an Emergency Use Authorization (EUA). This EUA will remain in effect (meaning this test can be used) for the duration of the COVID-19 declaration under Section 564(b)(1) of the Act, 21 U.S.C. section 360bbb-3(b)(1), unless the authorization is terminated or revoked.  Performed at [facility], [place] [address], [place], [place] [address]   Culture, blood (routine x 2)     Status: None (Preliminary result)   Collection Time: 11/30/20  5:44 AM   Specimen: BLOOD  Result Value Ref Range Status   Specimen Description BLOOD RIGHT ARM  Final   Special Requests   Final    BOTTLES DRAWN AEROBIC AND ANAEROBIC Blood Culture adequate volume   Culture   Final    NO GROWTH 1 DAY Performed at [facility], [place] [address] [place], [place] [address]    Report Status PENDING  Incomplete  Culture, blood (routine x 2)     Status: None (Preliminary result)   Collection Time: 11/30/20  5:44 AM   Specimen: BLOOD LEFT HAND  Result Value Ref Range Status   Specimen Description BLOOD LEFT HAND  Final   Special Requests   Final    BOTTLES DRAWN AEROBIC AND ANAEROBIC Blood Culture adequate volume   Culture   Final    NO GROWTH 1 DAY Performed at [facility], [place] [address], [place], [place] [address]    Report Status PENDING  Incomplete  Resp Panel by RT-PCR (Flu A&B, Covid)     Status: None   Collection Time: 12/01/20  2:46 PM   Specimen: Nasopharyngeal(NP) swabs in vial transport medium  Result Value Ref Range Status   SARS  Coronavirus 2 by RT PCR NEGATIVE NEGATIVE Final    Comment: (NOTE) SARS-CoV-2 target nucleic acids are NOT DETECTED.  The SARS-CoV-2 RNA is generally detectable in upper respiratory specimens during the acute phase of infection. The lowest concentration of SARS-CoV-2 viral copies this assay can detect is 138 copies/mL. A negative result does not preclude SARS-Cov-2 infection  and should not be used as the sole basis for treatment or other patient management decisions. A negative result may occur with  improper specimen collection/handling, submission of specimen other than nasopharyngeal swab, presence of viral mutation(s) within the areas targeted by this assay, and inadequate number of viral copies(<138 copies/mL). A negative result must be combined with clinical observations, patient history, and epidemiological information. The expected result is Negative.  Fact Sheet for Patients:  EntrepreneurPulse.com.au  Fact Sheet for Healthcare Providers:  IncredibleEmployment.be  This test is no t yet approved or cleared by the [place] FDA and  has been authorized for detection and/or diagnosis of SARS-CoV-2 by FDA under an Emergency Use Authorization (EUA). This EUA will remain  in effect (meaning this test can be used) for the duration of the COVID-19 declaration under Section 564(b)(1) of the Act, 21 U.S.C.section 360bbb-3(b)(1), unless the authorization is terminated  or revoked sooner.       Influenza A by PCR NEGATIVE NEGATIVE Final   Influenza B by PCR NEGATIVE NEGATIVE Final    Comment: (NOTE) The Xpert Xpress SARS-CoV-2/FLU/RSV plus assay is intended as an aid in the diagnosis of influenza from Nasopharyngeal swab specimens and should not be used as a sole basis for treatment. Nasal washings and aspirates are unacceptable for Xpert Xpress SARS-CoV-2/FLU/RSV testing.  Fact Sheet for  Patients: EntrepreneurPulse.com.au  Fact Sheet for Healthcare Providers: IncredibleEmployment.be  This test is not yet approved or cleared by the [place] FDA and has been authorized for detection and/or diagnosis of SARS-CoV-2 by FDA under an Emergency Use Authorization (EUA). This EUA will remain in effect (meaning this test can be used) for the duration of the COVID-19 declaration under Section 564(b)(1) of the Act, 21 U.S.C. section 360bbb-3(b)(1), unless the authorization is terminated or revoked.  Performed at [facility], [place] [address] [place], [place] [address]          Radiology Studies: DG CHEST PORT 1 VIEW  Result Date: 11/30/2020 CLINICAL DATA:  85 year old male with fever. EXAM: PORTABLE CHEST 1 VIEW COMPARISON:  Portable chest 03/19/2020 and earlier. CT Abdomen and Pelvis 04/06/2020 and earlier. FINDINGS: Portable AP semi upright view at 0554 hours. Low lung volumes with bibasilar pleural effusions, not significantly changed since October. Visible mediastinal contours remain normal. Visualized tracheal air column is within normal limits. No pneumothorax or pulmonary edema. No air bronchograms. Paucity of bowel gas in the upper abdomen. IMPRESSION: Low lung volumes and small bilateral pleural effusions, not significantly changed since October. Electronically Signed   By: Genevie Ann M.D.   On: 11/30/2020 06:15        Scheduled Meds:  atorvastatin  20 mg Oral QPM   brimonidine  1 drop Both Eyes BID   dorzolamide-timolol  1 drop Both Eyes BID   gabapentin  600 mg Oral BID   insulin aspart  0-5 Units Subcutaneous QHS   insulin aspart  0-9 Units Subcutaneous TID WC   latanoprost  1 drop Both Eyes QHS   levothyroxine  88 mcg Oral QHS   memantine  10 mg Oral BID   multivitamin with minerals  1 tablet Oral Daily   Netarsudil Dimesylate  1 drop Both Eyes QHS   pantoprazole  40 mg Intravenous Q12H   tamsulosin  0.4 mg Oral  BID   Continuous Infusions:  cefTRIAXone (ROCEPHIN)  IV     lactated ringers Stopped (11/30/20 1236)   pantoprazole       LOS: 6 days  Time spent: 25 minutes    Edwin Dada, MD [facility] 12/01/2020, 6:49 PM     Please page though [facility] or Epic secure chat:  For [employer], Adult nurse

## 2020-12-02 ENCOUNTER — Encounter (HOSPITAL_COMMUNITY): Payer: Self-pay | Admitting: Gastroenterology

## 2020-12-02 LAB — GLUCOSE, CAPILLARY
Glucose-Capillary: 138 mg/dL — ABNORMAL HIGH (ref 70–99)
Glucose-Capillary: 168 mg/dL — ABNORMAL HIGH (ref 70–99)
Glucose-Capillary: 150 mg/dL — ABNORMAL HIGH (ref 70–99)
Glucose-Capillary: 164 mg/dL — ABNORMAL HIGH (ref 70–99)

## 2020-12-02 LAB — CBC
HCT: 24.2 % — ABNORMAL LOW (ref 39.0–52.0)
Hemoglobin: 8.3 g/dL — ABNORMAL LOW (ref 13.0–17.0)
MCH: 31.8 pg (ref 26.0–34.0)
MCHC: 34.3 g/dL (ref 30.0–36.0)
MCV: 92.7 fL (ref 80.0–100.0)
Platelets: 284 10*3/uL (ref 150–400)
RBC: 2.61 MIL/uL — ABNORMAL LOW (ref 4.22–5.81)
RDW: 14.2 % (ref 11.5–15.5)
WBC: 14.2 10*3/uL — ABNORMAL HIGH (ref 4.0–10.5)
nRBC: 0 % (ref 0.0–0.2)

## 2020-12-02 NOTE — Progress Notes (Signed)
[facility] PROGRESS NOTE    Mike Mack  IZT:[id number] DOB: 1936/01/30 DOA: 11/24/2020 PCP: Jani Gravel, MD      Brief Narrative:  [name] is a 85 y.o. M with DM, HTN, BPH, CKD 3A, CVA, cognitive decline, HTN, childhood polio with RLE atrophy and weakness and neuropathy presented with generalized weakness for 2 days, then found to have AKI with azotemia and creatinine up to 2.5 from a baseline of 1.3 and BUN in the 80s.  Patient was initially put on IV fluids and improved.  On 6/13 he had black bowel movement and became more anemic.  GI were consulted, and EGD showed 4 ulcers in the stomach and duodenum.         Assessment & Plan:   Acute kidney injury on CKD stage IIIa Resolved to baseline with fluids  Acute blood loss anemia on anemia of chronic disease Due to bleeding ulcers.   6/13 noted to have melena, PPI started, GI consulted, transfused 1 unit 6/14 EGD showed 2 ulcers in stomach and 2 in duodenum, biopsies sent  Hgb subsequently stabilized   Acute upper GI bleed - Continue PPI   Fever due to UTI Developed fever 6/14.  Urinalysis with rare bacteria, CXR clear, blood cultures negative.  No respiratory symptoms.  Reported dysuria.  No systemic symptoms.   COVID negative - Continue Rocephin, plan to d/c on Cefdinir - Follow urine culture  Diabetes Glucoses normal - Continue atorvastatin - Continue sliding scale corrections   BPH - Continue Flomax  Dementia - Continue memantine  Hypothyroidism - Continue levothyroxine        Disposition: Status is: Inpatient  Remains inpatient appropriate because:Unsafe d/c plan  Dispo: The patient is from: Home              Anticipated d/c is to: SNF              Patient is medically ready for discharge but cannot be discharged due to unsafe DC plan   Difficult to place patient No       Level of care: Med-Surg       MDM: The below labs and imaging reports were  reviewed and summarized above.  Medication management as above.     DVT prophylaxis: Place and maintain sequential compression device Start: 11/29/20 1459  Code Status: FULL Family Communication: wife at the bedisde            Subjective: Fever curve improving.  Still has some discomfort with urination.  No sputum, dyspnea, chest pain.  No confusion.  Mentation is good.  No vomiting.  No abdominal pain.         Objective: Vitals:   12/02/20 0249 12/02/20 0756 12/02/20 1150 12/02/20 1517  BP: (!) 145/76 118/79 (!) 108/55 (!) 141/65  Pulse: 70 97 85 78  Resp: 20 16 16 16   Temp: 99.6 F (37.6 C) 100.1 F (37.8 C) 98.8 F (37.1 C) 98.8 F (37.1 C)  TempSrc: Oral Oral Oral Oral  SpO2: 96% 97% 99% 100%  Weight:      Height:        Intake/Output Summary (Last 24 hours) at 12/02/2020 1554 Last data filed at 12/02/2020 1518 Gross per 24 hour  Intake 100 ml  Output 2051 ml  Net -1951 ml   Filed Weights   11/25/20 0513 11/30/20 1130  Weight: 84.5 kg 84.5 kg    Examination: General appearance: Elderly adult male, lying in bed, no acute distress  HEENT: Anicteric, conjunctival pink, lids and lashes normal.  No nasal forming, discharge, or epistaxis.  Edentulous, lips normal, oropharynx moist, no oral lesions, hearing diminished Skin:  Cardiac: RRR, no murmurs, no lower extremity edema Respiratory: Normal respiratory rate and rhythm, lung sounds diminished, I Mack not appreciate rales or wheezing. Abdomen: Abdomen soft tenderness palpation or guarding, no ascites or distention MSK:  Neuro: Awake, makes eye contact, moves upper extremities with generalized weakness, speech fluent. Psych: Sensorium intact and responding to questions, attention distracted, affect blunted, judgment and insight appear impaired     Data Reviewed: I have personally reviewed following labs and imaging studies:  CBC: Recent Labs  Lab 11/29/20 1735 11/30/20 0108 11/30/20 0535  12/01/20 0136 12/02/20 0026  WBC 12.0* 11.7* 10.1 12.7* 14.2*  HGB 8.5* 8.7* 8.5* 8.7* 8.3*  HCT 25.6* 26.0* 25.4* 26.0* 24.2*  MCV 96.6 93.2 94.8 94.9 92.7  PLT 281 269 264 279 179   Basic Metabolic Panel: Recent Labs  Lab 11/26/20 0146 11/27/20 0043 11/28/20 1503 11/30/20 0535 12/01/20 0136  NA 139 135 134* 133* 134*  K 4.5 5.2* 4.7 4.5 4.7  CL 111 108 106 107 104  CO2 24 17* 24 22 23   GLUCOSE 131* 129* 127* 118* 167*  BUN 50* 32* 22 22 20   CREATININE 1.63* 1.47* 1.44* 1.46* 1.39*  CALCIUM 8.6* 8.2* 8.2* 8.1* 8.2*  MG 2.6*  --   --   --   --   PHOS 2.2*  --   --   --   --    GFR: Estimated Creatinine Clearance: 41 mL/min (A) (by C-G formula based on SCr of 1.39 mg/dL (H)). Liver Function Tests: Recent Labs  Lab 11/26/20 0146  ALBUMIN 2.6*   No results for input(s): LIPASE, AMYLASE in the last 168 hours. No results for input(s): AMMONIA in the last 168 hours. Coagulation Profile: No results for input(s): INR, PROTIME in the last 168 hours. Cardiac Enzymes: No results for input(s): CKTOTAL, CKMB, CKMBINDEX, TROPONINI in the last 168 hours. BNP (last 3 results) No results for input(s): PROBNP in the last 8760 hours. HbA1C: No results for input(s): HGBA1C in the last 72 hours. CBG: Recent Labs  Lab 12/01/20 1646 12/01/20 2023 12/02/20 0619 12/02/20 1204 12/02/20 1544  GLUCAP 208* 226* 138* 168* 150*   Lipid Profile: No results for input(s): CHOL, HDL, LDLCALC, TRIG, CHOLHDL, LDLDIRECT in the last 72 hours. Thyroid Function Tests: No results for input(s): TSH, T4TOTAL, FREET4, T3FREE, THYROIDAB in the last 72 hours. Anemia Panel: No results for input(s): VITAMINB12, FOLATE, FERRITIN, TIBC, IRON, RETICCTPCT in the last 72 hours. Urine analysis:    Component Value Date/Time   COLORURINE YELLOW 11/30/2020 1635   APPEARANCEUR CLEAR 11/30/2020 1635   LABSPEC 1.013 11/30/2020 1635   PHURINE 6.0 11/30/2020 1635   GLUCOSEU NEGATIVE 11/30/2020 1635   HGBUR  SMALL (A) 11/30/2020 1635   BILIRUBINUR NEGATIVE 11/30/2020 1635   KETONESUR 5 (A) 11/30/2020 1635   PROTEINUR 30 (A) 11/30/2020 1635   UROBILINOGEN 0.2 10/25/2014 1520   NITRITE NEGATIVE 11/30/2020 1635   LEUKOCYTESUR NEGATIVE 11/30/2020 1635   Sepsis Labs: @LABRCNTIP (procalcitonin:4,lacticacidven:4)  ) Recent Results (from the past 240 hour(s))  Resp Panel by RT-PCR (Flu A&B, Covid) Nasopharyngeal Swab     Status: None   Collection Time: 11/25/20 12:56 AM   Specimen: Nasopharyngeal Swab; Nasopharyngeal(NP) swabs in vial transport medium  Result Value Ref Range Status   SARS Coronavirus 2 by RT PCR NEGATIVE NEGATIVE Final  Comment: (NOTE) SARS-CoV-2 target nucleic acids are NOT DETECTED.  The SARS-CoV-2 RNA is generally detectable in upper respiratory specimens during the acute phase of infection. The lowest concentration of SARS-CoV-2 viral copies this assay can detect is 138 copies/mL. A negative result does not preclude SARS-Cov-2 infection and should not be used as the sole basis for treatment or other patient management decisions. A negative result may occur with  improper specimen collection/handling, submission of specimen other than nasopharyngeal swab, presence of viral mutation(s) within the areas targeted by this assay, and inadequate number of viral copies(<138 copies/mL). A negative result must be combined with clinical observations, patient history, and epidemiological information. The expected result is Negative.  Fact Sheet for Patients:  EntrepreneurPulse.com.au  Fact Sheet for Healthcare Providers:  IncredibleEmployment.be  This test is no t yet approved or cleared by the [place] FDA and  has been authorized for detection and/or diagnosis of SARS-CoV-2 by FDA under an Emergency Use Authorization (EUA). This EUA will remain  in effect (meaning this test can be used) for the duration of the COVID-19 declaration  under Section 564(b)(1) of the Act, 21 U.S.C.section 360bbb-3(b)(1), unless the authorization is terminated  or revoked sooner.       Influenza A by PCR NEGATIVE NEGATIVE Final   Influenza B by PCR NEGATIVE NEGATIVE Final    Comment: (NOTE) The Xpert Xpress SARS-CoV-2/FLU/RSV plus assay is intended as an aid in the diagnosis of influenza from Nasopharyngeal swab specimens and should not be used as a sole basis for treatment. Nasal washings and aspirates are unacceptable for Xpert Xpress SARS-CoV-2/FLU/RSV testing.  Fact Sheet for Patients: EntrepreneurPulse.com.au  Fact Sheet for Healthcare Providers: IncredibleEmployment.be  This test is not yet approved or cleared by the [place] FDA and has been authorized for detection and/or diagnosis of SARS-CoV-2 by FDA under an Emergency Use Authorization (EUA). This EUA will remain in effect (meaning this test can be used) for the duration of the COVID-19 declaration under Section 564(b)(1) of the Act, 21 U.S.C. section 360bbb-3(b)(1), unless the authorization is terminated or revoked.  Performed at [facility], [place] [address] [place], [place] [address]   Culture, blood (routine x 2)     Status: None (Preliminary result)   Collection Time: 11/30/20  5:44 AM   Specimen: BLOOD  Result Value Ref Range Status   Specimen Description BLOOD RIGHT ARM  Final   Special Requests   Final    BOTTLES DRAWN AEROBIC AND ANAEROBIC Blood Culture adequate volume   Culture   Final    NO GROWTH 2 DAYS Performed at [facility], [place] [address], [place], [place] [address]    Report Status PENDING  Incomplete  Culture, blood (routine x 2)     Status: None (Preliminary result)   Collection Time: 11/30/20  5:44 AM   Specimen: BLOOD LEFT HAND  Result Value Ref Range Status   Specimen Description BLOOD LEFT HAND  Final   Special Requests   Final    BOTTLES DRAWN AEROBIC AND ANAEROBIC Blood Culture  adequate volume   Culture   Final    NO GROWTH 2 DAYS Performed at [facility], [place] [address], [place], [place] [address]    Report Status PENDING  Incomplete  Culture, blood (routine x 2)     Status: None (Preliminary result)   Collection Time: 12/01/20  4:09 AM   Specimen: BLOOD RIGHT FOREARM  Result Value Ref Range Status   Specimen Description  BLOOD RIGHT FOREARM  Final   Special Requests   Final    BOTTLES DRAWN AEROBIC AND ANAEROBIC Blood Culture adequate volume   Culture   Final    NO GROWTH 1 DAY Performed at [facility], [address], [place], [place] [address]    Report Status PENDING  Incomplete  Culture, blood (routine x 2)     Status: None (Preliminary result)   Collection Time: 12/01/20  4:14 AM   Specimen: BLOOD RIGHT HAND  Result Value Ref Range Status   Specimen Description BLOOD RIGHT HAND  Final   Special Requests   Final    BOTTLES DRAWN AEROBIC AND ANAEROBIC Blood Culture results may not be optimal due to an inadequate volume of blood received in culture bottles   Culture   Final    NO GROWTH 1 DAY Performed at [facility], [place] [address], [place], [place] [address]    Report Status PENDING  Incomplete  Resp Panel by RT-PCR (Flu A&B, Covid)     Status: None   Collection Time: 12/01/20  2:46 PM   Specimen: Nasopharyngeal(NP) swabs in vial transport medium  Result Value Ref Range Status   SARS Coronavirus 2 by RT PCR NEGATIVE NEGATIVE Final    Comment: (NOTE) SARS-CoV-2 target nucleic acids are NOT DETECTED.  The SARS-CoV-2 RNA is generally detectable in upper respiratory specimens during the acute phase of infection. The lowest concentration of SARS-CoV-2 viral copies this assay can detect is 138 copies/mL. A negative result does not preclude SARS-Cov-2 infection and should not be used as the sole basis for treatment or other patient management decisions. A negative result may occur with  improper specimen collection/handling,  submission of specimen other than nasopharyngeal swab, presence of viral mutation(s) within the areas targeted by this assay, and inadequate number of viral copies(<138 copies/mL). A negative result must be combined with clinical observations, patient history, and epidemiological information. The expected result is Negative.  Fact Sheet for Patients:  EntrepreneurPulse.com.au  Fact Sheet for Healthcare Providers:  IncredibleEmployment.be  This test is no t yet approved or cleared by the [place] FDA and  has been authorized for detection and/or diagnosis of SARS-CoV-2 by FDA under an Emergency Use Authorization (EUA). This EUA will remain  in effect (meaning this test can be used) for the duration of the COVID-19 declaration under Section 564(b)(1) of the Act, 21 U.S.C.section 360bbb-3(b)(1), unless the authorization is terminated  or revoked sooner.       Influenza A by PCR NEGATIVE NEGATIVE Final   Influenza B by PCR NEGATIVE NEGATIVE Final    Comment: (NOTE) The Xpert Xpress SARS-CoV-2/FLU/RSV plus assay is intended as an aid in the diagnosis of influenza from Nasopharyngeal swab specimens and should not be used as a sole basis for treatment. Nasal washings and aspirates are unacceptable for Xpert Xpress SARS-CoV-2/FLU/RSV testing.  Fact Sheet for Patients: EntrepreneurPulse.com.au  Fact Sheet for Healthcare Providers: IncredibleEmployment.be  This test is not yet approved or cleared by the [place] FDA and has been authorized for detection and/or diagnosis of SARS-CoV-2 by FDA under an Emergency Use Authorization (EUA). This EUA will remain in effect (meaning this test can be used) for the duration of the COVID-19 declaration under Section 564(b)(1) of the Act, 21 U.S.C. section 360bbb-3(b)(1), unless the authorization is terminated or revoked.  Performed at [facility], [place] [address], [place], [place] [address]          Radiology  Studies: No results found.      Scheduled Meds:  atorvastatin  20 mg Oral QPM   brimonidine  1 drop Both Eyes BID   dorzolamide-timolol  1 drop Both Eyes BID   gabapentin  600 mg Oral BID   insulin aspart  0-5 Units Subcutaneous QHS   insulin aspart  0-9 Units Subcutaneous TID WC   latanoprost  1 drop Both Eyes QHS   levothyroxine  88 mcg Oral QHS   memantine  10 mg Oral BID   multivitamin with minerals  1 tablet Oral Daily   Netarsudil Dimesylate  1 drop Both Eyes QHS   pantoprazole  40 mg Oral BID AC   tamsulosin  0.4 mg Oral BID   Continuous Infusions:  cefTRIAXone (ROCEPHIN)  IV 1 g (12/01/20 2004)   lactated ringers Stopped (11/30/20 1236)     LOS: 7 days    Time spent: 25  minutes    Edwin Dada, MD [facility] 12/02/2020, 3:54 PM     Please page though [place] or Epic secure chat:  For [employer], Adult nurse

## 2020-12-02 NOTE — Anesthesia Postprocedure Evaluation (Signed)
Anesthesia Post Note  Patient: Mike Mack  Procedure(s) Performed: ESOPHAGOGASTRODUODENOSCOPY (EGD) BIOPSY     Patient location during evaluation: Endoscopy Anesthesia Type: MAC Level of consciousness: awake Pain management: pain level controlled Vital Signs Assessment: post-procedure vital signs reviewed and stable Respiratory status: spontaneous breathing Cardiovascular status: stable Postop Assessment: no apparent nausea or vomiting Anesthetic complications: no   No notable events documented.  Last Vitals:  Vitals:   12/01/20 2336 12/02/20 0249  BP: (!) 142/77 (!) 145/76  Pulse: 90 70  Resp: 20 20  Temp: 37.5 C 37.6 C  SpO2: 96% 96%    Last Pain:  Vitals:   12/02/20 0249  TempSrc: Oral  PainSc:                  [name]

## 2020-12-02 NOTE — TOC Progression Note (Signed)
Transition of Care [facility]) - Progression Note    Patient Details  Name: Mike Mack MRN: [id number] Date of Birth: February 22, 1936  Transition of Care [facility]) CM/SW [place],  Phone Number: 12/02/2020, 5:13 PM  Clinical Narrative:     Insurance remains pending- SNF sent updated MD note, patient is medially stable for d/c -   TOC will continue to follow and assist w/ d/c planning.  Thurmond Butts, MSW, LCSW Clinical Social Worker    Expected Discharge Plan: Skilled Nursing Facility Barriers to Discharge: Continued Medical Work up  Expected Discharge Plan and Services Expected Discharge Plan: [place] arrangements for the past 2 months: Single Family Home Expected Discharge Date: 11/27/20                                     Social Determinants of Health (SDOH) Interventions    Readmission Risk Interventions No flowsheet data found.

## 2020-12-02 NOTE — Progress Notes (Signed)
Physical Therapy Treatment Patient Details Name: Mike Mack MRN: [id number] DOB: 07/12/1935 Today's Date: 12/02/2020    History of Present Illness Pt presented to ED 6/8 with weakness and inability to ambulate. Pt found to have AKI. PMH - polio with chronic weakness of RLE, DM, HTN, anemia, CVA    PT Comments    Pt received in supine, agreeable to therapy session and with good participation and fair tolerance for bed mobility and transfer training. Primary session focus on supine/seated UE/LE therapeutic exercises for strengthening as pt limited by bowel urgency once seated EOB and needing increased time for use of bed pan.  He continues to demonstrate trace to no R quad contraction, MD messaged to see if KI can be obtained to see if this helps with standing tolerance next session. Pt continues to benefit from PT services to progress toward functional mobility goals, SNF remains appropriate.  Follow Up Recommendations  SNF     Equipment Recommendations  None recommended by PT;Other (comment) (defer to next location)    Recommendations for Other Services       Precautions / Restrictions Precautions Precautions: Fall Precaution Comments: chronic R LE dysfunction d/t childhood polio Required Braces or Orthoses: Other Brace (he may benefit from hinged lockable knee brace in future for RLE (significant quad weakness)) Restrictions Weight Bearing Restrictions: No    Mobility  Bed Mobility Overal bed mobility: Needs Assistance Bed Mobility: Rolling;Sidelying to Sit;Sit to Sidelying Rolling: Mod assist;+2 for physical assistance Sidelying to sit: Max assist;+2 for physical assistance     Sit to sidelying: Max assist General bed mobility comments: increased time, incremental cues, use of bed features to perform, needs LE and trunk assist to rise and totalA for BLE assist when returning to supine but able to assist with trunk lowering    Transfers Overall transfer level: Needs  assistance   Transfers: Lateral/Scoot Transfers          Lateral/Scoot Transfers: Max assist General transfer comment: pt with bowel urgency once seated EOB and unable to stand, focus on seated lateral scooting ~35ft toward HOB, pt needs mod cues for UE placement and maxA via transfer pad unable to fully clear hips from bed when scooting  Ambulation/Gait                 Stairs             Wheelchair Mobility    Modified Rankin (Stroke Patients Only)       Balance Overall balance assessment: Needs assistance Sitting-balance support: Feet supported Sitting balance-Leahy Scale: Poor Sitting balance - Comments: pt needs BUE support and min guard to minA for support Postural control: Posterior lean                                  Cognition Arousal/Alertness: Awake/alert Behavior During Therapy: Flat affect Overall Cognitive Status: Impaired/Different from baseline Area of Impairment: Memory;Following commands;Problem solving;Attention;Safety/judgement;Awareness                 Orientation Level: Disoriented to;Time Current Attention Level: Focused;Sustained Memory: Decreased short-term memory Following Commands: Follows one step commands with increased time;Follows one step commands inconsistently Safety/Judgement: Decreased awareness of deficits;Decreased awareness of safety Awareness: Intellectual Problem Solving: Slow processing;Decreased initiation;Difficulty sequencing;Requires verbal cues;Requires tactile cues General Comments: Baseline cognitive impairment per chart. Able to follow directions sometimes but slower to process cues this date. Wife not present during session today.  Exercises Other Exercises Other Exercises: seated BLE AAROM: hip flexion, LAQ (PROM on RLE no quad contraction visible this date), ankle pumps (AROM) x10 reps ea Other Exercises: supine LLE AAROM: heel slides, hip abduction x10 reps ea Other Exercises:  BUE AROM: arm bicycle x15 reps, chest press each arm x10 reps    General Comments General comments (skin integrity, edema, etc.): VSS on RA 95% and HR 80's bpm, BP not assessed as pt w/o complaint of dizziness      Pertinent Vitals/Pain Pain Assessment: Faces Faces Pain Scale: Hurts even more Pain Location: R LE with knee flexion/extension and palpation to posterior thigh/popliteal fossa/calf Pain Descriptors / Indicators: Grimacing;Guarding Pain Intervention(s): Limited activity within patient's tolerance;Monitored during session;Repositioned    Home Living                      Prior Function            PT Goals (current goals can now be found in the care plan section) Acute Rehab PT Goals Patient Stated Goal: increase strength and ability to stand PT Goal Formulation: With patient Time For Goal Achievement: 12/09/20 Progress towards PT goals: Progressing toward goals    Frequency    Min 3X/week      PT Plan Current plan remains appropriate    Co-evaluation              AM-PAC PT "6 Clicks" Mobility   Outcome Measure  Help needed turning from your back to your side while in a flat bed without using bedrails?: A Lot Help needed moving from lying on your back to sitting on the side of a flat bed without using bedrails?: A Lot Help needed moving to and from a bed to a chair (including a wheelchair)?: Total Help needed standing up from a chair using your arms (e.g., wheelchair or bedside chair)?: Total Help needed to walk in hospital room?: Total Help needed climbing 3-5 steps with a railing? : Total 6 Click Score: 8    End of Session Equipment Utilized During Treatment: Gait belt Activity Tolerance: Patient limited by fatigue;Other (comment) (bowel urgency limiting) Patient left: with call bell/phone within reach;in bed;with bed alarm set (NT notified pt on bed pan) Nurse Communication: Mobility status;Need for lift equipment;Other (comment) (Stedy vs  hoyer if too fatigued to get Stedy flaps in place) PT Visit Diagnosis: Unsteadiness on feet (R26.81);Other abnormalities of gait and mobility (R26.89);Muscle weakness (generalized) (M62.81)     Time: 8756-4332 PT Time Calculation (min) (ACUTE ONLY): 16 min  Charges:  $Therapeutic Exercise: 8-22 mins                     Becker Christopher P., PTA Acute Rehabilitation Services Pager: [phone] Office: [place] 12/02/2020, 3:19 PM

## 2020-12-03 ENCOUNTER — Inpatient Hospital Stay (HOSPITAL_COMMUNITY): Payer: Medicare HMO

## 2020-12-03 LAB — GLUCOSE, CAPILLARY
Glucose-Capillary: 135 mg/dL — ABNORMAL HIGH (ref 70–99)
Glucose-Capillary: 160 mg/dL — ABNORMAL HIGH (ref 70–99)
Glucose-Capillary: 173 mg/dL — ABNORMAL HIGH (ref 70–99)
Glucose-Capillary: 199 mg/dL — ABNORMAL HIGH (ref 70–99)

## 2020-12-03 LAB — URINALYSIS, ROUTINE W REFLEX MICROSCOPIC
Bacteria, UA: NONE SEEN
Bilirubin Urine: NEGATIVE
Glucose, UA: NEGATIVE mg/dL
Hgb urine dipstick: NEGATIVE
Ketones, ur: NEGATIVE mg/dL
Leukocytes,Ua: NEGATIVE
Nitrite: NEGATIVE
Protein, ur: 100 mg/dL — AB
Specific Gravity, Urine: 1.017 (ref 1.005–1.030)
pH: 5 (ref 5.0–8.0)

## 2020-12-03 NOTE — Progress Notes (Signed)
Orthopedic Tech Progress Note Patient Details:  Mike Mack September 28, 1935 [id number]  Ortho Devices Type of Ortho Device: Knee Immobilizer Ortho Device/Splint Location: RLE Ortho Device/Splint Interventions: Ordered, Application   Post Interventions Patient Tolerated: Well Instructions Provided: Adjustment of device  [name] 12/03/2020, 3:04 PM

## 2020-12-03 NOTE — Progress Notes (Addendum)
[name] [facility] PROGRESS NOTE    Mike Mack  IRS:[id number] DOB: 1935/11/18 DOA: 11/24/2020 PCP: Jani Gravel, MD      Brief Narrative:  [name] is a 85 y.o. M with DM, HTN, BPH, CKD 3A, CVA, cognitive decline, HTN, childhood polio with RLE atrophy and weakness and neuropathy presented with generalized weakness for 2 days, then found to have AKI with azotemia and creatinine up to 2.5 from a baseline of 1.3 and BUN in the 80s.  Patient was initially put on IV fluids and improved.  On 6/13 he had black bowel movement and became more anemic.  GI were consulted, and EGD showed 4 ulcers in the stomach and duodenum.  Developed fever, on abx for uti, continue to spike fever, recultured,        Assessment & Plan:   Acute kidney injury on CKD stage IIIa Resolved to baseline with fluids  Acute blood loss anemia on anemia of chronic disease/acute upper GI bleed/gastric and duodenal ulcers Due to bleeding ulcers.   6/13 noted to have melena, PPI started, GI consulted, transfused 1 unit 6/14 EGD showed 2 ulcers in stomach and 2 in duodenum, biopsies sent On PPI Hgb subsequently stabilized   Fever due to UTI Developed fever 6/14.  Urinalysis with rare bacteria, CXR clear, blood cultures negative.  No respiratory symptoms.  Reported dysuria.  No systemic symptoms.   COVID negative - Continue Rocephin -Continues spiked fever on 6/17 , with persistent leukocytosis ,follow urine culture, repeat blood culture, repeat chest x-ray, getting renal ultrasound to rule out hydronephrosis, bladder scan to rule out urinary retention  BPH - Continue Flomax -Check bladder scan  Insulin-dependent type 2 diabetes, fairly controlled ,A1c 6.5 On Tresiba 20 unit daily prior to admission, this was held since admission He is on sliding scale insulin currently, a.m. fasting blood glucose 167 Change diet from regular diet to carb modified diet, continue SSI, continue hold Tresiba -  Continue atorvastatin  History of CVA With right hemiparesis  History of polio Right lower extremity deformity   Dementia - Continue memantine  Hypothyroidism - Continue levothyroxine  Failure to thrive, need skilled nursing facility placement  Bilateral pedal edema, right greater than left, wife reports this is chronic and intermittent, monitor volume status, DC IV hydration        Disposition: Status is: Inpatient  Remains inpatient appropriate because:Unsafe d/c plan  Dispo: The patient is from: Home              Anticipated d/c is to: SNF              Patient is not medically ready to discharge, due to persistent fever   Difficult to place patient No       Level of care: Med-Surg       MDM: The below labs and imaging reports were reviewed and summarized above.  Medication management as above.     DVT prophylaxis: Place and maintain sequential compression device Start: 11/29/20 1459  Code Status: FULL Family Communication: wife at the bedside on 6/17    Subjective:   Continue to spike fever, he denies pain, no cough, no diarrhea, he appear weak, he is calm and cooperative Wife at bedside     Objective: Vitals:   12/02/20 1927 12/02/20 2349 12/03/20 0355 12/03/20 0924  BP: (!) 145/67 131/66 (!) 135/59 (!) 160/66  Pulse: 93 82 89 89  Resp: 16 18 16 18   Temp: 99.2 F (37.3 C) 99.9 F (  37.7 C) (!) 100.8 F (38.2 C) (!) 101 F (38.3 C)  TempSrc: Oral Oral Oral Oral  SpO2: 97% 98% 99% 100%  Weight:      Height:        Intake/Output Summary (Last 24 hours) at 12/03/2020 1123 Last data filed at 12/03/2020 5374 Gross per 24 hour  Intake 100 ml  Output 2351 ml  Net -2251 ml   Filed Weights   11/25/20 0513 11/30/20 1130  Weight: 84.5 kg 84.5 kg    Examination: General appearance: Elderly adult male, lying in bed, appear weak, no acute distress     HEENT: Anicteric, conjunctival pink, lids and lashes normal.  No nasal forming,  discharge, or epistaxis.  Edentulous, lips normal, oropharynx moist, no oral lesions, hearing diminished Skin: no rash Cardiac: RRR, no murmurs Respiratory: Normal respiratory rate and rhythm, lung sounds diminished, I Mack not appreciate rales or wheezing. Abdomen: protuberance Abdomen , soft , no tenderness , + bs MSK: right hemiparesis from prior CVA, right lower extremity/foot deformity from prior polio, bilateral pedal edema , right> left Neuro: Awake, makes eye contact, moves upper extremities with generalized weakness, speech fluent. Psych: Sensorium intact and responding to questions, attention distracted, affect blunted, judgment and insight appear impaired     Data Reviewed: I have personally reviewed following labs and imaging studies:  CBC: Recent Labs  Lab 11/29/20 1735 11/30/20 0108 11/30/20 0535 12/01/20 0136 12/02/20 0026  WBC 12.0* 11.7* 10.1 12.7* 14.2*  HGB 8.5* 8.7* 8.5* 8.7* 8.3*  HCT 25.6* 26.0* 25.4* 26.0* 24.2*  MCV 96.6 93.2 94.8 94.9 92.7  PLT 281 269 264 279 827   Basic Metabolic Panel: Recent Labs  Lab 11/27/20 0043 11/28/20 1503 11/30/20 0535 12/01/20 0136  NA 135 134* 133* 134*  K 5.2* 4.7 4.5 4.7  CL 108 106 107 104  CO2 17* 24 22 23   GLUCOSE 129* 127* 118* 167*  BUN 32* 22 22 20   CREATININE 1.47* 1.44* 1.46* 1.39*  CALCIUM 8.2* 8.2* 8.1* 8.2*   GFR: Estimated Creatinine Clearance: 41 mL/min (A) (by C-G formula based on SCr of 1.39 mg/dL (H)). Liver Function Tests: No results for input(s): AST, ALT, ALKPHOS, BILITOT, PROT, ALBUMIN in the last 168 hours.  No results for input(s): LIPASE, AMYLASE in the last 168 hours. No results for input(s): AMMONIA in the last 168 hours. Coagulation Profile: No results for input(s): INR, PROTIME in the last 168 hours. Cardiac Enzymes: No results for input(s): CKTOTAL, CKMB, CKMBINDEX, TROPONINI in the last 168 hours. BNP (last 3 results) No results for input(s): PROBNP in the last 8760  hours. HbA1C: No results for input(s): HGBA1C in the last 72 hours. CBG: Recent Labs  Lab 12/02/20 0619 12/02/20 1204 12/02/20 1544 12/02/20 2121 12/03/20 0613  GLUCAP 138* 168* 150* 164* 135*   Lipid Profile: No results for input(s): CHOL, HDL, LDLCALC, TRIG, CHOLHDL, LDLDIRECT in the last 72 hours. Thyroid Function Tests: No results for input(s): TSH, T4TOTAL, FREET4, T3FREE, THYROIDAB in the last 72 hours. Anemia Panel: No results for input(s): VITAMINB12, FOLATE, FERRITIN, TIBC, IRON, RETICCTPCT in the last 72 hours. Urine analysis:    Component Value Date/Time   COLORURINE YELLOW 11/30/2020 [place] 11/30/2020 1635   LABSPEC 1.013 11/30/2020 1635   PHURINE 6.0 11/30/2020 1635   GLUCOSEU NEGATIVE 11/30/2020 1635   HGBUR SMALL (A) 11/30/2020 1635   BILIRUBINUR NEGATIVE 11/30/2020 1635   KETONESUR 5 (A) 11/30/2020 1635   PROTEINUR 30 (A) 11/30/2020 1635  UROBILINOGEN 0.2 10/25/2014 1520   NITRITE NEGATIVE 11/30/2020 1635   LEUKOCYTESUR NEGATIVE 11/30/2020 1635   Sepsis Labs: @LABRCNTIP (procalcitonin:4,lacticacidven:4)  ) Recent Results (from the past 240 hour(s))  Resp Panel by RT-PCR (Flu A&B, Covid) Nasopharyngeal Swab     Status: None   Collection Time: 11/25/20 12:56 AM   Specimen: Nasopharyngeal Swab; Nasopharyngeal(NP) swabs in vial transport medium  Result Value Ref Range Status   SARS Coronavirus 2 by RT PCR NEGATIVE NEGATIVE Final    Comment: (NOTE) SARS-CoV-2 target nucleic acids are NOT DETECTED.  The SARS-CoV-2 RNA is generally detectable in upper respiratory specimens during the acute phase of infection. The lowest concentration of SARS-CoV-2 viral copies this assay can detect is 138 copies/mL. A negative result does not preclude SARS-Cov-2 infection and should not be used as the sole basis for treatment or other patient management decisions. A negative result may occur with  improper specimen collection/handling, submission of  specimen other than nasopharyngeal swab, presence of viral mutation(s) within the areas targeted by this assay, and inadequate number of viral copies(<138 copies/mL). A negative result must be combined with clinical observations, patient history, and epidemiological information. The expected result is Negative.  Fact Sheet for Patients:  EntrepreneurPulse.com.au  Fact Sheet for Healthcare Providers:  IncredibleEmployment.be  This test is no t yet approved or cleared by the [place] FDA and  has been authorized for detection and/or diagnosis of SARS-CoV-2 by FDA under an Emergency Use Authorization (EUA). This EUA will remain  in effect (meaning this test can be used) for the duration of the COVID-19 declaration under Section 564(b)(1) of the Act, 21 U.S.C.section 360bbb-3(b)(1), unless the authorization is terminated  or revoked sooner.       Influenza A by PCR NEGATIVE NEGATIVE Final   Influenza B by PCR NEGATIVE NEGATIVE Final    Comment: (NOTE) The Xpert Xpress SARS-CoV-2/FLU/RSV plus assay is intended as an aid in the diagnosis of influenza from Nasopharyngeal swab specimens and should not be used as a sole basis for treatment. Nasal washings and aspirates are unacceptable for Xpert Xpress SARS-CoV-2/FLU/RSV testing.  Fact Sheet for Patients: EntrepreneurPulse.com.au  Fact Sheet for Healthcare Providers: IncredibleEmployment.be  This test is not yet approved or cleared by the [place] FDA and has been authorized for detection and/or diagnosis of SARS-CoV-2 by FDA under an Emergency Use Authorization (EUA). This EUA will remain in effect (meaning this test can be used) for the duration of the COVID-19 declaration under Section 564(b)(1) of the Act, 21 U.S.C. section 360bbb-3(b)(1), unless the authorization is terminated or revoked.  Performed at [facility], [place] [address]  [place], [place] [address]   Culture, blood (routine x 2)     Status: None (Preliminary result)   Collection Time: 11/30/20  5:44 AM   Specimen: BLOOD  Result Value Ref Range Status   Specimen Description BLOOD RIGHT ARM  Final   Special Requests   Final    BOTTLES DRAWN AEROBIC AND ANAEROBIC Blood Culture adequate volume   Culture   Final    NO GROWTH 3 DAYS Performed at [facility], [place] [address], [place], [place] [address]    Report Status PENDING  Incomplete  Culture, blood (routine x 2)     Status: None (Preliminary result)   Collection Time: 11/30/20  5:44 AM   Specimen: BLOOD LEFT HAND  Result Value Ref Range Status   Specimen Description BLOOD LEFT HAND  Final   Special Requests   Final  BOTTLES DRAWN AEROBIC AND ANAEROBIC Blood Culture adequate volume   Culture   Final    NO GROWTH 3 DAYS Performed at [facility], [place] [address], [place], [place] [address]    Report Status PENDING  Incomplete  Culture, blood (routine x 2)     Status: None (Preliminary result)   Collection Time: 12/01/20  4:09 AM   Specimen: BLOOD RIGHT FOREARM  Result Value Ref Range Status   Specimen Description BLOOD RIGHT FOREARM  Final   Special Requests   Final    BOTTLES DRAWN AEROBIC AND ANAEROBIC Blood Culture adequate volume   Culture   Final    NO GROWTH 2 DAYS Performed at [facility], [place] [address] [place], [place] [address]    Report Status PENDING  Incomplete  Culture, blood (routine x 2)     Status: None (Preliminary result)   Collection Time: 12/01/20  4:14 AM   Specimen: BLOOD RIGHT HAND  Result Value Ref Range Status   Specimen Description BLOOD RIGHT HAND  Final   Special Requests   Final    BOTTLES DRAWN AEROBIC AND ANAEROBIC Blood Culture results may not be optimal due to an inadequate volume of blood received in culture bottles   Culture   Final    NO GROWTH 2 DAYS Performed at [facility], [place] [address] [place], [place] [address]    Report  Status PENDING  Incomplete  Resp Panel by RT-PCR (Flu A&B, Covid)     Status: None   Collection Time: 12/01/20  2:46 PM   Specimen: Nasopharyngeal(NP) swabs in vial transport medium  Result Value Ref Range Status   SARS Coronavirus 2 by RT PCR NEGATIVE NEGATIVE Final    Comment: (NOTE) SARS-CoV-2 target nucleic acids are NOT DETECTED.  The SARS-CoV-2 RNA is generally detectable in upper respiratory specimens during the acute phase of infection. The lowest concentration of SARS-CoV-2 viral copies this assay can detect is 138 copies/mL. A negative result does not preclude SARS-Cov-2 infection and should not be used as the sole basis for treatment or other patient management decisions. A negative result may occur with  improper specimen collection/handling, submission of specimen other than nasopharyngeal swab, presence of viral mutation(s) within the areas targeted by this assay, and inadequate number of viral copies(<138 copies/mL). A negative result must be combined with clinical observations, patient history, and epidemiological information. The expected result is Negative.  Fact Sheet for Patients:  EntrepreneurPulse.com.au  Fact Sheet for Healthcare Providers:  IncredibleEmployment.be  This test is no t yet approved or cleared by the [place] FDA and  has been authorized for detection and/or diagnosis of SARS-CoV-2 by FDA under an Emergency Use Authorization (EUA). This EUA will remain  in effect (meaning this test can be used) for the duration of the COVID-19 declaration under Section 564(b)(1) of the Act, 21 U.S.C.section 360bbb-3(b)(1), unless the authorization is terminated  or revoked sooner.       Influenza A by PCR NEGATIVE NEGATIVE Final   Influenza B by PCR NEGATIVE NEGATIVE Final    Comment: (NOTE) The Xpert Xpress SARS-CoV-2/FLU/RSV plus assay is intended as an aid in the diagnosis of influenza from Nasopharyngeal swab  specimens and should not be used as a sole basis for treatment. Nasal washings and aspirates are unacceptable for Xpert Xpress SARS-CoV-2/FLU/RSV testing.  Fact Sheet for Patients: EntrepreneurPulse.com.au  Fact Sheet for Healthcare Providers: IncredibleEmployment.be  This test is not yet approved or cleared by the  [place] Architectural technologist and has been authorized for detection and/or diagnosis of SARS-CoV-2 by FDA under an Print production planner (EUA). This EUA will remain in effect (meaning this test can be used) for the duration of the COVID-19 declaration under Section 564(b)(1) of the Act, 21 U.S.C. section 360bbb-3(b)(1), unless the authorization is terminated or revoked.  Performed at [facility], [place] [address], [place], [place] [address]          Radiology Studies: No results found.      Scheduled Meds:  atorvastatin  20 mg Oral QPM   brimonidine  1 drop Both Eyes BID   dorzolamide-timolol  1 drop Both Eyes BID   gabapentin  600 mg Oral BID   insulin aspart  0-5 Units Subcutaneous QHS   insulin aspart  0-9 Units Subcutaneous TID WC   latanoprost  1 drop Both Eyes QHS   levothyroxine  88 mcg Oral QHS   memantine  10 mg Oral BID   multivitamin with minerals  1 tablet Oral Daily   Netarsudil Dimesylate  1 drop Both Eyes QHS   pantoprazole  40 mg Oral BID AC   tamsulosin  0.4 mg Oral BID   Continuous Infusions:  cefTRIAXone (ROCEPHIN)  IV 1 g (12/02/20 2215)   lactated ringers Stopped (11/30/20 1236)     LOS: 8 days    Time spent: 25  minutes    Florencia Reasons, MD PhD FACP [facility] 12/03/2020, 11:23 AM     Please page though Shea Evans or Epic secure chat:  For [employer], Adult nurse

## 2020-12-03 NOTE — Plan of Care (Signed)
  Problem: Education: Goal: Knowledge of General Education information will improve Description Including pain rating scale, medication(s)/side effects and non-pharmacologic comfort measures Outcome: Progressing   

## 2020-12-03 NOTE — TOC Progression Note (Addendum)
Transition of Care [facility]) - Progression Note    Patient Details  Name: Micharl Helmes MRN: [id number] Date of Birth: 1936-05-31  Transition of Care [facility]) CM/SW [place], [place] Phone Number: 12/03/2020, 1:20 PM  Clinical Narrative:    CSW spoke with facility who noted that they could accept pt today if medically ready. CSW was notified by MD that pt was not medically ready. CSW left VM to follow up with facility and see if they can accept pt over the weekend. SW will continue to follow for DC needs.  1:30 pm CSW spoke with Rolla Plate at [facility] who noted pt can DC over the weekend if medically stable. DC summary will need to be faxed to [phone] Call to report will be main number. SW will follow for DC when medically ready. Expected Discharge Plan: [place] Barriers to Discharge: Continued Medical Work up  Expected Discharge Plan and Services Expected Discharge Plan: [place] arrangements for the past 2 months: Single Family Home Expected Discharge Date: 11/27/20                                     Social Determinants of Health (SDOH) Interventions    Readmission Risk Interventions No flowsheet data found.

## 2020-12-04 LAB — CBC WITH DIFFERENTIAL/PLATELET
Abs Immature Granulocytes: 0.05 10*3/uL (ref 0.00–0.07)
Basophils Absolute: 0 10*3/uL (ref 0.0–0.1)
Basophils Relative: 0 %
Eosinophils Absolute: 0.1 10*3/uL (ref 0.0–0.5)
Eosinophils Relative: 1 %
HCT: 25.4 % — ABNORMAL LOW (ref 39.0–52.0)
Hemoglobin: 8.4 g/dL — ABNORMAL LOW (ref 13.0–17.0)
Immature Granulocytes: 0 %
Lymphocytes Relative: 10 %
Lymphs Abs: 1.2 10*3/uL (ref 0.7–4.0)
MCH: 31.2 pg (ref 26.0–34.0)
MCHC: 33.1 g/dL (ref 30.0–36.0)
MCV: 94.4 fL (ref 80.0–100.0)
Monocytes Absolute: 1 10*3/uL (ref 0.1–1.0)
Monocytes Relative: 8 %
Neutro Abs: 9.4 10*3/uL — ABNORMAL HIGH (ref 1.7–7.7)
Neutrophils Relative %: 81 %
Platelets: 292 10*3/uL (ref 150–400)
RBC: 2.69 MIL/uL — ABNORMAL LOW (ref 4.22–5.81)
RDW: 14.2 % (ref 11.5–15.5)
WBC: 11.7 10*3/uL — ABNORMAL HIGH (ref 4.0–10.5)
nRBC: 0 % (ref 0.0–0.2)

## 2020-12-04 LAB — BASIC METABOLIC PANEL
Anion gap: 7 (ref 5–15)
BUN: 17 mg/dL (ref 8–23)
CO2: 24 mmol/L (ref 22–32)
Calcium: 8 mg/dL — ABNORMAL LOW (ref 8.9–10.3)
Chloride: 104 mmol/L (ref 98–111)
Creatinine, Ser: 1.4 mg/dL — ABNORMAL HIGH (ref 0.61–1.24)
GFR, Estimated: 50 mL/min — ABNORMAL LOW (ref >60)
Glucose, Bld: 186 mg/dL — ABNORMAL HIGH (ref 70–99)
Potassium: 4.2 mmol/L (ref 3.5–5.1)
Sodium: 135 mmol/L (ref 135–145)

## 2020-12-04 LAB — BLOOD CULTURE ID PANEL (REFLEXED) - BCID2

## 2020-12-04 LAB — URINE CULTURE: Culture: >=100000 — AB

## 2020-12-04 LAB — GLUCOSE, CAPILLARY
Glucose-Capillary: 134 mg/dL — ABNORMAL HIGH (ref 70–99)
Glucose-Capillary: 197 mg/dL — ABNORMAL HIGH (ref 70–99)
Glucose-Capillary: 159 mg/dL — ABNORMAL HIGH (ref 70–99)
Glucose-Capillary: 182 mg/dL — ABNORMAL HIGH (ref 70–99)

## 2020-12-04 MED ORDER — SODIUM CHLORIDE 0.9 % IV SOLN
2.0000 g | Freq: Two times a day (BID) | INTRAVENOUS | Status: DC
  Administered 2020-12-04 – 2020-12-07 (×7): 2 g via INTRAVENOUS
  Filled 2020-12-04 (×7): qty 2

## 2020-12-04 NOTE — Progress Notes (Addendum)
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  Patient was initially put on IV fluid with improvement.  On November 29, 2020 he had a bowel movement that was black and he became more anemic GI was consulted and and he had EGD, EGD showed 4 ulcers in his stomach and duodenum.  He also spiked fevers and was found to have urinary tract infection his T-max of 101 current temperature is 99.8  Patient seen and examined at bedside he stated he is doing well except he is having some mild epigastric tenderness  Assessment/Plan: Principal Problem:   AKI (acute kidney injury) ([place]) Active Problems:   Hypertension   BPH (benign prostatic hyperplasia)   Generalized weakness   S/P cholecystectomy   Monoparesis of leg ([place])   Controlled type 2 diabetes mellitus with stable proliferative retinopathy of both eyes, with long-term current use of insulin (HCC)   Dementia ([place])   Polio osteopathy of lower leg, left ([place])  1.  Acute kidney injury on chronic kidney disease stage III A, resolving with IV fluid Current creatinine is 1.4  2.  Acute blood loss anemia due to gastric and duodenal ulcer status post EGD continue PPI Protonix Status post 1 unit packed RBC transfusion His hemoglobin is 8.4 today  3.  Fever with urinary tract infection.  His T-max was 180 yesterday today is 99.8.  Patient was started on on ceftriaxone.  His blood culture is negative chest x-ray is clear his  urine does show some urinary tract infection COVID is negative he has persistent leukocytosis but improving He had a renal ultrasound that did not show any hydronephrosis  4.  BPH patient is on Flomax  5.  Insulin-dependent diabetes mellitus fairly controlled with A1c of 6.5 His Tyler Aas was held during his admission and he is on insulin sliding scale and he will maintain a low calorie diet  6.  Hyperlipidemia continue atorvastatin  7.  History of CVA with right hemiparesis   8.  Dementia continue memantine  9.  Hypothyroidism continue levothyroxine   10.  History of childhood polio with residual right lower extremity deformity  11.  Mild hypertension.  Patient's blood pressure is slightly elevated he is currently not on any antihypertensive he would benefit from ACE inhibitor due to his diabetes but his kidney function is abnormal so we will monitor and start him on antihypertensive if blood pressure continues to be elevated  12.  Failure to thrive.  Patient was evaluated by physical therapy and they recommending SNF  Code Status: Full  Severity of Illness: The appropriate patient status for this patient is INPATIENT. Inpatient status is judged to be reasonable and necessary in order to provide the required intensity of service to ensure the patient's safety. The patient's presenting symptoms, physical exam findings, and initial radiographic and laboratory data in the context of their chronic comorbidities is felt to place them at high risk for further clinical deterioration. Furthermore, it is not anticipated that the patient  will be medically stable for discharge from the hospital within 2 midnights of admission. The following factors support the patient status of inpatient.   " Admitted with acute kidney injury and currently still having fever   * I certify that at the point of admission it is my clinical judgment that the patient will require inpatient hospital care spanning beyond  2 midnights from the point of admission due to high intensity of service, high risk for further deterioration and high frequency of surveillance required.*   Family Communication: Wife Mike Mack at bedside  Disposition Plan: To SNF when stable   Consultants: GI  Procedures: EGD November 29, 2020  Antimicrobials: Ceftriaxone  DVT prophylaxis: SCD   Objective: Vitals:   12/03/20 1540 12/03/20 2039 12/03/20 2304 12/04/20 0312  BP: 130/63 (!) 153/64 (!) 147/66 (!) 149/68  Pulse:  90 79 87  Resp: 18 16 18 15   Temp: 98.6 F (37 C) 99.3 F (37.4 C) 98.9 F (37.2 C) 99.8 F (37.7 C)  TempSrc: Oral Oral Oral Oral  SpO2: 96% 97% 96% 95%  Weight:      Height:        Intake/Output Summary (Last 24 hours) at 12/04/2020 0855 Last data filed at 12/04/2020 0647 Gross per 24 hour  Intake 120 ml  Output 1300 ml  Net -1180 ml   Filed Weights   11/25/20 0513 11/30/20 1130  Weight: 84.5 kg 84.5 kg   Body mass index is 31 kg/m.  Exam:  General: 85 y.o. year-old male well developed well nourished in no acute distress.  Alert and oriented x3.  Slightly obese Cardiovascular: Regular rate and rhythm with no rubs or gallops.  No thyromegaly or JVD noted.   Respiratory: Clear to auscultation with no wheezes or rales. Good inspiratory effort. Abdomen: Soft mild epigastric tenderness, nondistended with normal bowel sounds x4 quadrants. Musculoskeletal: Bilateral lower extremity edema. 2/4 pulses in all 4 extremities. Right lower extremity deformity Skin: No ulcerative lesions noted or rashes, Psychiatry: Mood is appropriate for condition and setting Neurology  Right hemiparesis     Data Reviewed: CBC: Recent Labs  Lab 11/30/20 0108 11/30/20 0535 12/01/20 0136 12/02/20 0026 12/04/20 0127  WBC 11.7* 10.1 12.7* 14.2* 11.7*  NEUTROABS  --   --   --   --  9.4*  HGB 8.7* 8.5* 8.7* 8.3* 8.4*  HCT 26.0* 25.4* 26.0* 24.2* 25.4*  MCV 93.2 94.8 94.9 92.7 94.4  PLT 269 264 279 284 539    Basic Metabolic Panel: Recent Labs  Lab 11/28/20 1503 11/30/20 0535 12/01/20 0136 12/04/20 0127  NA 134* 133* 134* 135  K 4.7 4.5 4.7 4.2  CL 106 107 104 104  CO2 24 22 23 24   GLUCOSE 127* 118* 167* 186*  BUN 22 22 20 17   CREATININE 1.44* 1.46* 1.39* 1.40*  CALCIUM 8.2* 8.1* 8.2* 8.0*   GFR: Estimated Creatinine Clearance: 40.7 mL/min (A) (by C-G formula based on SCr of 1.4 mg/dL (H)). Liver Function Tests: No results for input(s): AST, ALT, ALKPHOS, BILITOT, PROT, ALBUMIN in the last 168 hours. No results for input(s): LIPASE, AMYLASE in the last 168 hours. No results for input(s): AMMONIA in the last 168 hours. Coagulation Profile: No results for input(s): INR, PROTIME in the last 168 hours. Cardiac Enzymes: No results for input(s): CKTOTAL, CKMB, CKMBINDEX, TROPONINI in the last 168 hours. BNP (last 3 results) No results for input(s): PROBNP in the last 8760 hours. HbA1C: No results for input(s): HGBA1C in the last 72  hours. CBG: Recent Labs  Lab 12/03/20 0613 12/03/20 1149 12/03/20 1623 12/03/20 2136 12/04/20 0619  GLUCAP 135* 160* 173* 199* 134*   Lipid Profile: No results for input(s): CHOL, HDL, LDLCALC, TRIG, CHOLHDL, LDLDIRECT in the last 72 hours. Thyroid Function Tests: No results for input(s): TSH, T4TOTAL, FREET4, T3FREE, THYROIDAB in the last 72 hours. Anemia Panel: No results for input(s): VITAMINB12, FOLATE, FERRITIN, TIBC, IRON, RETICCTPCT in the last 72 hours. Urine analysis:    Component Value Date/Time   COLORURINE YELLOW 12/03/2020 1750   APPEARANCEUR CLEAR 12/03/2020 1750   LABSPEC 1.017 12/03/2020 1750   PHURINE 5.0 12/03/2020 1750   GLUCOSEU NEGATIVE 12/03/2020 1750   HGBUR NEGATIVE 12/03/2020 1750   BILIRUBINUR NEGATIVE 12/03/2020 1750   KETONESUR NEGATIVE 12/03/2020 1750   PROTEINUR 100 (A) 12/03/2020 1750   UROBILINOGEN 0.2 10/25/2014 1520   NITRITE NEGATIVE 12/03/2020 1750   LEUKOCYTESUR NEGATIVE 12/03/2020 1750   Sepsis  Labs: @LABRCNTIP (procalcitonin:4,lacticidven:4)  ) Recent Results (from the past 240 hour(s))  Resp Panel by RT-PCR (Flu A&B, Covid) Nasopharyngeal Swab     Status: None   Collection Time: 11/25/20 12:56 AM   Specimen: Nasopharyngeal Swab; Nasopharyngeal(NP) swabs in vial transport medium  Result Value Ref Range Status   SARS Coronavirus 2 by RT PCR NEGATIVE NEGATIVE Final    Comment: (NOTE) SARS-CoV-2 target nucleic acids are NOT DETECTED.  The SARS-CoV-2 RNA is generally detectable in upper respiratory specimens during the acute phase of infection. The lowest concentration of SARS-CoV-2 viral copies this assay can detect is 138 copies/mL. A negative result does not preclude SARS-Cov-2 infection and should not be used as the sole basis for treatment or other patient management decisions. A negative result may occur with  improper specimen collection/handling, submission of specimen other than nasopharyngeal swab, presence of viral mutation(s) within the areas targeted by this assay, and inadequate number of viral copies(<138 copies/mL). A negative result must be combined with clinical observations, patient history, and epidemiological information. The expected result is Negative.  Fact Sheet for Patients:  EntrepreneurPulse.com.au  Fact Sheet for Healthcare Providers:  IncredibleEmployment.be  This test is no t yet approved or cleared by the [place] FDA and  has been authorized for detection and/or diagnosis of SARS-CoV-2 by FDA under an Emergency Use Authorization (EUA). This EUA will remain  in effect (meaning this test can be used) for the duration of the COVID-19 declaration under Section 564(b)(1) of the Act, 21 U.S.C.section 360bbb-3(b)(1), unless the authorization is terminated  or revoked sooner.       Influenza A by PCR NEGATIVE NEGATIVE Final   Influenza B by PCR NEGATIVE NEGATIVE Final    Comment: (NOTE) The Xpert  Xpress SARS-CoV-2/FLU/RSV plus assay is intended as an aid in the diagnosis of influenza from Nasopharyngeal swab specimens and should not be used as a sole basis for treatment. Nasal washings and aspirates are unacceptable for Xpert Xpress SARS-CoV-2/FLU/RSV testing.  Fact Sheet for Patients: EntrepreneurPulse.com.au  Fact Sheet for Healthcare Providers: IncredibleEmployment.be  This test is not yet approved or cleared by the [place] FDA and has been authorized for detection and/or diagnosis of SARS-CoV-2 by FDA under an Emergency Use Authorization (EUA). This EUA will remain in effect (meaning this test can be used) for the duration of the COVID-19 declaration under Section 564(b)(1) of the Act, 21 U.S.C. section 360bbb-3(b)(1), unless the authorization is terminated or revoked.  Performed at [facility], [place] [address], [place], [place] [address]   Culture, blood (  routine x 2)     Status: None (Preliminary result)   Collection Time: 11/30/20  5:44 AM   Specimen: BLOOD  Result Value Ref Range Status   Specimen Description BLOOD RIGHT ARM  Final   Special Requests   Final    BOTTLES DRAWN AEROBIC AND ANAEROBIC Blood Culture adequate volume   Culture   Final    NO GROWTH 3 DAYS Performed at [facility], [address] [place], [place] [address]    Report Status PENDING  Incomplete  Culture, blood (routine x 2)     Status: None (Preliminary result)   Collection Time: 11/30/20  5:44 AM   Specimen: BLOOD LEFT HAND  Result Value Ref Range Status   Specimen Description BLOOD LEFT HAND  Final   Special Requests   Final    BOTTLES DRAWN AEROBIC AND ANAEROBIC Blood Culture adequate volume   Culture   Final    NO GROWTH 3 DAYS Performed at [facility], [place] [address], [place], [place] [address]    Report Status PENDING  Incomplete  Culture, blood (routine x 2)     Status: None (Preliminary result)   Collection Time:  12/01/20  4:09 AM   Specimen: BLOOD RIGHT FOREARM  Result Value Ref Range Status   Specimen Description BLOOD RIGHT FOREARM  Final   Special Requests   Final    BOTTLES DRAWN AEROBIC AND ANAEROBIC Blood Culture adequate volume   Culture   Final    NO GROWTH 2 DAYS Performed at [facility], [place] [address], [place], [place] [address]    Report Status PENDING  Incomplete  Culture, blood (routine x 2)     Status: None (Preliminary result)   Collection Time: 12/01/20  4:14 AM   Specimen: BLOOD RIGHT HAND  Result Value Ref Range Status   Specimen Description BLOOD RIGHT HAND  Final   Special Requests   Final    BOTTLES DRAWN AEROBIC AND ANAEROBIC Blood Culture results may not be optimal due to an inadequate volume of blood received in culture bottles   Culture   Final    NO GROWTH 2 DAYS Performed at [facility], [place] [address], [place], [place] [address]    Report Status PENDING  Incomplete  Resp Panel by RT-PCR (Flu A&B, Covid)     Status: None   Collection Time: 12/01/20  2:46 PM   Specimen: Nasopharyngeal(NP) swabs in vial transport medium  Result Value Ref Range Status   SARS Coronavirus 2 by RT PCR NEGATIVE NEGATIVE Final    Comment: (NOTE) SARS-CoV-2 target nucleic acids are NOT DETECTED.  The SARS-CoV-2 RNA is generally detectable in upper respiratory specimens during the acute phase of infection. The lowest concentration of SARS-CoV-2 viral copies this assay can detect is 138 copies/mL. A negative result does not preclude SARS-Cov-2 infection and should not be used as the sole basis for treatment or other patient management decisions. A negative result may occur with  improper specimen collection/handling, submission of specimen other than nasopharyngeal swab, presence of viral mutation(s) within the areas targeted by this assay, and inadequate number of viral copies(<138 copies/mL). A negative result must be combined with clinical observations, patient  history, and epidemiological information. The expected result is Negative.  Fact Sheet for Patients:  EntrepreneurPulse.com.au  Fact Sheet for Healthcare Providers:  IncredibleEmployment.be  This test is no t yet approved or cleared by the [place] and  has been authorized for  detection and/or diagnosis of SARS-CoV-2 by FDA under an Emergency Use Authorization (EUA). This EUA will remain  in effect (meaning this test can be used) for the duration of the COVID-19 declaration under Section 564(b)(1) of the Act, 21 U.S.C.section 360bbb-3(b)(1), unless the authorization is terminated  or revoked sooner.       Influenza A by PCR NEGATIVE NEGATIVE Final   Influenza B by PCR NEGATIVE NEGATIVE Final    Comment: (NOTE) The Xpert Xpress SARS-CoV-2/FLU/RSV plus assay is intended as an aid in the diagnosis of influenza from Nasopharyngeal swab specimens and should not be used as a sole basis for treatment. Nasal washings and aspirates are unacceptable for Xpert Xpress SARS-CoV-2/FLU/RSV testing.  Fact Sheet for Patients: EntrepreneurPulse.com.au  Fact Sheet for Healthcare Providers: IncredibleEmployment.be  This test is not yet approved or cleared by the [place] FDA and has been authorized for detection and/or diagnosis of SARS-CoV-2 by FDA under an Emergency Use Authorization (EUA). This EUA will remain in effect (meaning this test can be used) for the duration of the COVID-19 declaration under Section 564(b)(1) of the Act, 21 U.S.C. section 360bbb-3(b)(1), unless the authorization is terminated or revoked.  Performed at [facility], [place] [address] [place], [place] [address]   Urine Culture     Status: Abnormal (Preliminary result)   Collection Time: 12/01/20  5:00 PM   Specimen: Urine, Random  Result Value Ref Range Status   Specimen Description URINE, RANDOM  Final   Special Requests  NONE  Final   Culture (A)  Final    >=100,000 COLONIES/mL ENTEROBACTER AEROGENES >=100,000 COLONIES/mL ESCHERICHIA COLI SUSCEPTIBILITIES TO FOLLOW Performed at [facility], [place] [address], [place], [place] [address]    Report Status PENDING  Incomplete      Studies: US RENAL  Result Date: 12/03/2020 CLINICAL DATA:  Elevated creatinine EXAM: RENAL / URINARY TRACT ULTRASOUND COMPLETE COMPARISON:  None. FINDINGS: Right Kidney: Renal measurements: 9.5 x 4.6 x 4.3 cm = volume: 99 mL. Echogenicity within normal limits. Benign simple cysts. No mass or hydronephrosis visualized. Left Kidney: Renal measurements: 11.8 x 4.5 x 3.6 cm = volume: 100 mL. Echogenicity within normal limits. Benign simple cysts. No mass or hydronephrosis visualized. Bladder: Appears normal for degree of bladder distention. Other: Prostatomegaly. IMPRESSION: 1. No acute abnormality. No hydronephrosis. 2. Prostatomegaly. Electronically Signed   By: Eddie Candle M.D.   On: 12/03/2020 12:29   DG CHEST PORT 1 VIEW  Result Date: 12/03/2020 CLINICAL DATA:  Fever and hypertension EXAM: PORTABLE CHEST 1 VIEW COMPARISON:  November 30, 2020 FINDINGS: There is mild left base atelectasis with small left pleural effusion. The lungs elsewhere are clear. Heart is upper normal in size with pulmonary vascularity normal. No adenopathy. No bone lesions. IMPRESSION: Left base atelectasis with small left pleural effusion. Lungs elsewhere clear. Heart upper normal in size. Electronically Signed   By: Lowella Grip III M.D.   On: 12/03/2020 12:48    Scheduled Meds:  atorvastatin  20 mg Oral QPM   brimonidine  1 drop Both Eyes BID   dorzolamide-timolol  1 drop Both Eyes BID   gabapentin  600 mg Oral BID   insulin aspart  0-5 Units Subcutaneous QHS   insulin aspart  0-9 Units Subcutaneous TID WC   latanoprost  1 drop Both Eyes QHS   levothyroxine  88 mcg Oral QHS   memantine  10 mg Oral BID   multivitamin with minerals  1 tablet Oral  Daily  Netarsudil Dimesylate  1 drop Both Eyes QHS   pantoprazole  40 mg Oral BID AC   tamsulosin  0.4 mg Oral BID    Continuous Infusions:  cefTRIAXone (ROCEPHIN)  IV 1 g (12/03/20 2106)     LOS: 9 days     Cristal Deer, MD [facility]  To reach me or the doctor on call, go to: [email or web address] Password [facility]  12/04/2020, 8:55 AM

## 2020-12-04 NOTE — Progress Notes (Signed)
Occupational Therapy Treatment Patient Details Name: Mike Mack MRN: [id number] DOB: 1935-10-17 Today's Date: 12/04/2020    History of present illness Pt presented to ED 6/8 with weakness and inability to ambulate. Pt found to have AKI. PMH - polio with chronic weakness of RLE, DM, HTN, anemia, CVA   OT comments  Patient with increased lethargy and complaints of being tired.  He declined transfer to the recliner this date.  Patient agreed to sit EOB with wife's encouragement. Lunch tray arrived, so patient able to sit at the edge to wash face and clean hands with setup and balance support.  Max A for basic bed mobility, and +2 to reposition higher in bed for lunch.  Given patient's heavy 24 hour care requirements and insufficient assist at home, SNF continues to be recommended.  OT will continue to follow in the acute setting to maximize functional status.    Follow Up Recommendations  SNF    Equipment Recommendations  3 in 1 bedside commode    Recommendations for Other Services      Precautions / Restrictions Precautions Precautions: Fall Precaution Comments: chronic R LE dysfunction d/t childhood polio Required Braces or Orthoses: Knee Immobilizer - Right Knee Immobilizer - Right: Other (comment) (trial for RLE support during sit to stands.)       Mobility Bed Mobility Overal bed mobility: Needs Assistance Bed Mobility: Rolling;Sidelying to Sit;Sit to Sidelying Rolling: Max assist Sidelying to sit: Max assist     Sit to sidelying: Max assist General bed mobility comments: needing assist to elevate and lower trunk, scoot hip to the edge of bed and maintain sitting balance.                     General transfer comment: declined transfer to recliner, c/o tired and not feeling well.    Balance Overall balance assessment: Needs assistance Sitting-balance support: Feet supported;Bilateral upper extremity supported Sitting balance-Leahy Scale: Poor   Postural  control: Posterior lean                                 ADL either performed or assessed with clinical judgement   ADL       Grooming: Wash/dry hands;Wash/dry face;Set up;Sitting                                                         Cognition Arousal/Alertness: Lethargic Behavior During Therapy: Flat affect Overall Cognitive Status: Impaired/Different from baseline                         Following Commands: Follows one step commands inconsistently     Problem Solving: Decreased initiation;Difficulty sequencing;Requires verbal cues;Requires tactile cues                            Pertinent Vitals/ Pain       Pain Assessment: Faces Faces Pain Scale: Hurts a little bit Pain Location: with movement of RLE on/off the bed. Pain Descriptors / Indicators: Grimacing;Guarding Pain Intervention(s): Monitored during session  Frequency  Min 2X/week        Progress Toward Goals  OT Goals(current goals can now be found in the care plan section)  Progress towards OT goals: Progressing toward goals  Acute Rehab OT Goals Patient Stated Goal: none stated this date OT Goal Formulation: With patient Time For Goal Achievement: 12/09/20 Potential to Achieve Goals: [place] Discharge plan remains appropriate    Co-evaluation                 AM-PAC OT "6 Clicks" Daily Activity     Outcome Measure   Help from another person eating meals?: A Little Help from another person taking care of personal grooming?: A Little Help from another person toileting, which includes using toliet, bedpan, or urinal?: Total Help from another person bathing (including washing, rinsing, drying)?: A Lot Help from another person to put on and taking off regular upper body clothing?: A Little Help from another person to put on and taking off regular lower  body clothing?: Total 6 Click Score: 13    End of Session    OT Visit Diagnosis: Muscle weakness (generalized) (M62.81);History of falling (Z91.81);Other symptoms and signs involving cognitive function   Activity Tolerance Patient limited by lethargy   Patient Left with call bell/phone within reach;in bed;with bed alarm set   Nurse Communication          Time: 3875-6433 OT Time Calculation (min): 14 min  Charges: OT General Charges $OT Visit: 1 Visit OT Treatments $Self Care/Home Management : 8-22 mins  12/04/2020  Rich, OTR/L  Acute Rehabilitation Services  Office:  [phone]    Mike Mack 12/04/2020, 1:50 PM

## 2020-12-04 NOTE — Progress Notes (Signed)
PHARMACY - PHYSICIAN COMMUNICATION CRITICAL VALUE ALERT - BLOOD CULTURE IDENTIFICATION (BCID)  Mike Mack is an 85 y.o. male who presented to [facility] on 11/24/2020 with a chief complaint of weakness.  Assessment:   Pt with UTI enterobacter/ecloi from Ucx 6/15 started on rocephin. R to ancef > will change rocephin to cefepime to prevent resistance. With recurrent fever though WBC initially improved 14>11 BCID resulted this evening 1/6 Bcx with staph epi - probable contaminant    Name of physician (or Provider) Contacted: [facility] Dr Myna Hidalgo   Bea Graff.D. CPP, BCPS Clinical Pharmacist [phone] 12/04/2020 8:17 PM    Results for orders placed or performed during the hospital encounter of 11/24/20  Blood Culture ID Panel (Reflexed) (Collected: 12/03/2020 12:17 PM)  Result Value Ref Range   Enterococcus faecalis NOT DETECTED NOT DETECTED   Enterococcus Faecium NOT DETECTED NOT DETECTED   Listeria monocytogenes NOT DETECTED NOT DETECTED   Staphylococcus species DETECTED (A) NOT DETECTED   Staphylococcus aureus (BCID) NOT DETECTED NOT DETECTED   Staphylococcus epidermidis DETECTED (A) NOT DETECTED   Staphylococcus lugdunensis NOT DETECTED NOT DETECTED   Streptococcus species NOT DETECTED NOT DETECTED   Streptococcus agalactiae NOT DETECTED NOT DETECTED   Streptococcus pneumoniae NOT DETECTED NOT DETECTED   Streptococcus pyogenes NOT DETECTED NOT DETECTED   A.calcoaceticus-baumannii NOT DETECTED NOT DETECTED   Bacteroides fragilis NOT DETECTED NOT DETECTED   Enterobacterales NOT DETECTED NOT DETECTED   Enterobacter cloacae complex NOT DETECTED NOT DETECTED   Escherichia coli NOT DETECTED NOT DETECTED   Klebsiella aerogenes NOT DETECTED NOT DETECTED   Klebsiella oxytoca NOT DETECTED NOT DETECTED   Klebsiella pneumoniae NOT DETECTED NOT DETECTED   Proteus species NOT DETECTED NOT DETECTED   Salmonella species NOT DETECTED NOT DETECTED   Serratia marcescens NOT DETECTED NOT  DETECTED   Haemophilus influenzae NOT DETECTED NOT DETECTED   Neisseria meningitidis NOT DETECTED NOT DETECTED   Pseudomonas aeruginosa NOT DETECTED NOT DETECTED   Stenotrophomonas maltophilia NOT DETECTED NOT DETECTED   Candida albicans NOT DETECTED NOT DETECTED   Candida auris NOT DETECTED NOT DETECTED   Candida glabrata NOT DETECTED NOT DETECTED   Candida krusei NOT DETECTED NOT DETECTED   Candida parapsilosis NOT DETECTED NOT DETECTED   Candida tropicalis NOT DETECTED NOT DETECTED   Cryptococcus neoformans/gattii NOT DETECTED NOT DETECTED   Methicillin resistance mecA/C DETECTED (A) NOT DETECTED

## 2020-12-05 LAB — GLUCOSE, CAPILLARY
Glucose-Capillary: 147 mg/dL — ABNORMAL HIGH (ref 70–99)
Glucose-Capillary: 129 mg/dL — ABNORMAL HIGH (ref 70–99)
Glucose-Capillary: 158 mg/dL — ABNORMAL HIGH (ref 70–99)
Glucose-Capillary: 135 mg/dL — ABNORMAL HIGH (ref 70–99)

## 2020-12-05 LAB — URINE CULTURE: Culture: <10000 — AB

## 2020-12-05 LAB — CULTURE, BLOOD (ROUTINE X 2)
Culture: NO GROWTH
Culture: NO GROWTH
Special Requests: ADEQUATE
Special Requests: ADEQUATE

## 2020-12-05 MED ORDER — HYDRALAZINE HCL 25 MG PO TABS: 25.0000 mg | ORAL_TABLET | Freq: Three times a day (TID) | ORAL | Status: DC

## 2020-12-05 MED ORDER — AMLODIPINE BESYLATE 5 MG PO TABS
5.0000 mg | ORAL_TABLET | Freq: Every day | ORAL | Status: DC
  Administered 2020-12-05 – 2020-12-09 (×5): 5 mg via ORAL
  Filled 2020-12-05 (×5): qty 1

## 2020-12-05 MED ORDER — HYDRALAZINE HCL 50 MG PO TABS
50.0000 mg | ORAL_TABLET | Freq: Once | ORAL | Status: AC
  Administered 2020-12-05: 50 mg via ORAL
  Filled 2020-12-05: qty 1

## 2020-12-05 MED ORDER — METOPROLOL TARTRATE 25 MG PO TABS
25.0000 mg | ORAL_TABLET | Freq: Two times a day (BID) | ORAL | Status: DC
  Administered 2020-12-05 – 2020-12-09 (×10): 25 mg via ORAL
  Filled 2020-12-05 (×9): qty 1

## 2020-12-05 NOTE — Plan of Care (Signed)
  Problem: Education: Goal: Knowledge of General Education information will improve Description Including pain rating scale, medication(s)/side effects and non-pharmacologic comfort measures Outcome: Progressing   

## 2020-12-05 NOTE — Progress Notes (Addendum)
PROGRESS NOTE  Mike Mack NTZ:[id number] DOB: 02/06/1936 DOA: 11/24/2020 PCP: Jani Gravel, MD  HPI/Recap of past 24 hours: 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  Patient was initially put on IV fluid with improvement.  On November 29, 2020 he had a bowel movement that was black and he became more anemic GI was consulted and and he had EGD, EGD showed 4 ulcers in his stomach and duodenum.  He also spiked fevers and was found to have urinary tract infection his T-max of 101 current temperature is 99.8  Patient seen and examined at bedside he stated he is doing well except he is having some mild epigastric tenderness  UPDATE 12/05/20 Seen. C/o rt leg pain Nurse also reported elevated blood pressure he does not have any medications ordered  Assessment/Plan: Principal Problem:   AKI (acute kidney injury) ([place]) Active Problems:   Hypertension   BPH (benign prostatic hyperplasia)   Generalized weakness   S/P cholecystectomy   Monoparesis of leg ([place])   Controlled type 2 diabetes mellitus with stable proliferative retinopathy of both eyes, with long-term current use of insulin (HCC)   Dementia (HCC)   Polio osteopathy of lower leg, left ([place])  1.  Acute kidney injury on chronic kidney disease stage III A, resolving with IV fluid Current creatinine is 1.4  2.  Acute blood loss anemia due to gastric and duodenal ulcer status post EGD continue PPI Protonix Status post 1 unit packed RBC transfusion His hemoglobin is 8.4 today  3.  Fever with urinary tract infection.  His T-max  was 180 yesterday today is 99.8.  Patient was started on on ceftriaxone.  His blood culture is negative chest x-ray is clear his urine does show some urinary tract infection COVID is negative he has persistent leukocytosis but improving He had a renal ultrasound that did not show any hydronephrosis  4.  BPH patient is on Flomax  5.  Insulin-dependent diabetes mellitus fairly controlled with A1c of 6.5 His Tyler Aas was held during his admission and he is on insulin sliding scale and he will maintain a low calorie diet  6.  Hyperlipidemia continue atorvastatin  7.  History of CVA with right hemiparesis   8.  Dementia continue memantine  9.  Hypothyroidism continue levothyroxine   10.  History of childhood polio with residual right lower extremity deformity  11.  Mild hypertension.  Patient's blood pressure is slightly elevated he is currently not on any antihypertensive he would benefit from ACE inhibitor due to his diabetes but his kidney function is abnormal so we will monitor and start him on antihypertensive if blood pressure continues to be elevated his blood pressures have been elevated and we are going to start him on his home dose of antihypertensives which is the metoprolol and amlodipine.  I will give a one-time dose of hydralazine p.o.  12.  Failure to thrive.  Patient was evaluated by physical therapy and they recommending SNF  Code Status: Full  Severity of Illness: The appropriate patient status for this patient is INPATIENT. Inpatient status is judged to be reasonable  and necessary in order to provide the required intensity of service to ensure the patient's safety. The patient's presenting symptoms, physical exam findings, and initial radiographic and laboratory data in the context of their chronic comorbidities is felt to place them at high risk for further clinical deterioration. Furthermore, it is not anticipated that the patient will be medically stable for discharge from  the hospital within 2 midnights of admission. The following factors support the patient status of inpatient.   " Admitted with acute kidney injury and currently still having fever   * I certify that at the point of admission it is my clinical judgment that the patient will require inpatient hospital care spanning beyond 2 midnights from the point of admission due to high intensity of service, high risk for further deterioration and high frequency of surveillance required.*   Family Communication: Wife Dorothy at bedside  Disposition Plan: To SNF when stable   Consultants: GI  Procedures: EGD November 29, 2020  Antimicrobials: Ceftriaxone  DVT prophylaxis: SCD   Objective: Vitals:   12/04/20 1714 12/04/20 2007 12/04/20 2308 12/05/20 0420  BP: (!) 152/69 (!) 149/58 (!) 148/62 (!) 156/69  Pulse: 78 80 89 85  Resp: 18 18 16 18   Temp: 99.4 F (37.4 C) 99.3 F (37.4 C) 98.8 F (37.1 C) 98.8 F (37.1 C)  TempSrc: Oral Oral Oral Oral  SpO2: 100% 100% 97% 99%  Weight:    84.5 kg  Height:        Intake/Output Summary (Last 24 hours) at 12/05/2020 0931 Last data filed at 12/05/2020 4403 Gross per 24 hour  Intake 100 ml  Output 600 ml  Net -500 ml    Filed Weights   11/25/20 0513 11/30/20 1130 12/05/20 0420  Weight: 84.5 kg 84.5 kg 84.5 kg   Body mass index is 31 kg/m.  Exam:  General: 85 y.o. year-old male well developed well nourished in no acute distress.  Alert and oriented x3.  Slightly obese Cardiovascular: Regular rate and rhythm with no rubs or gallops.  No thyromegaly or JVD noted.   Respiratory: Clear to auscultation with no wheezes or rales. Good inspiratory effort. Abdomen: Soft mild epigastric tenderness, nondistended with normal bowel sounds x4 quadrants. Musculoskeletal: Bilateral lower extremity edema. 2/4 pulses in all 4 extremities. Right lower extremity deformity Skin: No ulcerative lesions noted or rashes, Psychiatry: Mood is appropriate for  condition and setting Neurology  Right hemiparesis     Data Reviewed: CBC: Recent Labs  Lab 11/30/20 0108 11/30/20 0535 12/01/20 0136 12/02/20 0026 12/04/20 0127  WBC 11.7* 10.1 12.7* 14.2* 11.7*  NEUTROABS  --   --   --   --  9.4*  HGB 8.7* 8.5* 8.7* 8.3* 8.4*  HCT 26.0* 25.4* 26.0* 24.2* 25.4*  MCV 93.2 94.8 94.9 92.7 94.4  PLT 269 264 279 284 474    Basic Metabolic Panel: Recent Labs  Lab 11/28/20 1503 11/30/20 0535 12/01/20 0136 12/04/20 0127  NA 134* 133* 134* 135  K 4.7 4.5 4.7 4.2  CL 106 107 104 104  CO2 24 22 23 24   GLUCOSE 127* 118* 167* 186*  BUN 22 22 20 17   CREATININE 1.44* 1.46* 1.39* 1.40*  CALCIUM 8.2* 8.1* 8.2* 8.0*    GFR: Estimated Creatinine Clearance: 40.7 mL/min (A) (by C-G formula based on SCr of 1.4 mg/dL (H)). Liver Function Tests: No results for input(s): AST, ALT, ALKPHOS, BILITOT, PROT, ALBUMIN in the last 168 hours. No results for input(s): LIPASE, AMYLASE in the  last 168 hours. No results for input(s): AMMONIA in the last 168 hours. Coagulation Profile: No results for input(s): INR, PROTIME in the last 168 hours. Cardiac Enzymes: No results for input(s): CKTOTAL, CKMB, CKMBINDEX, TROPONINI in the last 168 hours. BNP (last 3 results) No results for input(s): PROBNP in the last 8760 hours. HbA1C: No results for input(s): HGBA1C in the last 72 hours. CBG: Recent Labs  Lab 12/04/20 0619 12/04/20 1130 12/04/20 1709 12/04/20 2130 12/05/20 0625  GLUCAP 134* 197* 159* 182* 147*    Lipid Profile: No results for input(s): CHOL, HDL, LDLCALC, TRIG, CHOLHDL, LDLDIRECT in the last 72 hours. Thyroid Function Tests: No results for input(s): TSH, T4TOTAL, FREET4, T3FREE, THYROIDAB in the last 72 hours. Anemia Panel: No results for input(s): VITAMINB12, FOLATE, FERRITIN, TIBC, IRON, RETICCTPCT in the last 72 hours. Urine analysis:    Component Value Date/Time   COLORURINE YELLOW 12/03/2020 1750   APPEARANCEUR CLEAR 12/03/2020  1750   LABSPEC 1.017 12/03/2020 1750   PHURINE 5.0 12/03/2020 1750   GLUCOSEU NEGATIVE 12/03/2020 1750   HGBUR NEGATIVE 12/03/2020 1750   BILIRUBINUR NEGATIVE 12/03/2020 1750   KETONESUR NEGATIVE 12/03/2020 1750   PROTEINUR 100 (A) 12/03/2020 1750   UROBILINOGEN 0.2 10/25/2014 1520   NITRITE NEGATIVE 12/03/2020 1750   LEUKOCYTESUR NEGATIVE 12/03/2020 1750   Sepsis Labs: @LABRCNTIP (procalcitonin:4,lacticidven:4)  ) Recent Results (from the past 240 hour(s))  Culture, blood (routine x 2)     Status: None (Preliminary result)   Collection Time: 11/30/20  5:44 AM   Specimen: BLOOD  Result Value Ref Range Status   Specimen Description BLOOD RIGHT ARM  Final   Special Requests   Final    BOTTLES DRAWN AEROBIC AND ANAEROBIC Blood Culture adequate volume   Culture   Final    NO GROWTH 4 DAYS Performed at [facility], [place] [address] [place], [place] [address]    Report Status PENDING  Incomplete  Culture, blood (routine x 2)     Status: None (Preliminary result)   Collection Time: 11/30/20  5:44 AM   Specimen: BLOOD LEFT HAND  Result Value Ref Range Status   Specimen Description BLOOD LEFT HAND  Final   Special Requests   Final    BOTTLES DRAWN AEROBIC AND ANAEROBIC Blood Culture adequate volume   Culture   Final    NO GROWTH 4 DAYS Performed at [facility], [place] [address], [place], [place] [address]    Report Status PENDING  Incomplete  Culture, blood (routine x 2)     Status: None (Preliminary result)   Collection Time: 12/01/20  4:09 AM   Specimen: BLOOD RIGHT FOREARM  Result Value Ref Range Status   Specimen Description BLOOD RIGHT FOREARM  Final   Special Requests   Final    BOTTLES DRAWN AEROBIC AND ANAEROBIC Blood Culture adequate volume   Culture   Final    NO GROWTH 3 DAYS Performed at [facility], [place] [address], [place], [place] [address]    Report Status PENDING  Incomplete  Culture, blood (routine x 2)     Status: None (Preliminary  result)   Collection Time: 12/01/20  4:14 AM   Specimen: BLOOD RIGHT HAND  Result Value Ref Range Status   Specimen Description BLOOD RIGHT HAND  Final   Special Requests   Final    BOTTLES DRAWN AEROBIC AND ANAEROBIC Blood Culture results may not be optimal due to an inadequate volume of blood received in culture  bottles   Culture   Final    NO GROWTH 3 DAYS Performed at [facility], [place] [address], [place], [place] [address]    Report Status PENDING  Incomplete  Resp Panel by RT-PCR (Flu A&B, Covid)     Status: None   Collection Time: 12/01/20  2:46 PM   Specimen: Nasopharyngeal(NP) swabs in vial transport medium  Result Value Ref Range Status   SARS Coronavirus 2 by RT PCR NEGATIVE NEGATIVE Final    Comment: (NOTE) SARS-CoV-2 target nucleic acids are NOT DETECTED.  The SARS-CoV-2 RNA is generally detectable in upper respiratory specimens during the acute phase of infection. The lowest concentration of SARS-CoV-2 viral copies this assay can detect is 138 copies/mL. A negative result does not preclude SARS-Cov-2 infection and should not be used as the sole basis for treatment or other patient management decisions. A negative result may occur with  improper specimen collection/handling, submission of specimen other than nasopharyngeal swab, presence of viral mutation(s) within the areas targeted by this assay, and inadequate number of viral copies(<138 copies/mL). A negative result must be combined with clinical observations, patient history, and epidemiological information. The expected result is Negative.  Fact Sheet for Patients:  EntrepreneurPulse.com.au  Fact Sheet for Healthcare Providers:  IncredibleEmployment.be  This test is no t yet approved or cleared by the [place] FDA and  has been authorized for detection and/or diagnosis of SARS-CoV-2 by FDA under an Emergency Use Authorization (EUA). This EUA will remain  in  effect (meaning this test can be used) for the duration of the COVID-19 declaration under Section 564(b)(1) of the Act, 21 U.S.C.section 360bbb-3(b)(1), unless the authorization is terminated  or revoked sooner.       Influenza A by PCR NEGATIVE NEGATIVE Final   Influenza B by PCR NEGATIVE NEGATIVE Final    Comment: (NOTE) The Xpert Xpress SARS-CoV-2/FLU/RSV plus assay is intended as an aid in the diagnosis of influenza from Nasopharyngeal swab specimens and should not be used as a sole basis for treatment. Nasal washings and aspirates are unacceptable for Xpert Xpress SARS-CoV-2/FLU/RSV testing.  Fact Sheet for Patients: EntrepreneurPulse.com.au  Fact Sheet for Healthcare Providers: IncredibleEmployment.be  This test is not yet approved or cleared by the [place] FDA and has been authorized for detection and/or diagnosis of SARS-CoV-2 by FDA under an Emergency Use Authorization (EUA). This EUA will remain in effect (meaning this test can be used) for the duration of the COVID-19 declaration under Section 564(b)(1) of the Act, 21 U.S.C. section 360bbb-3(b)(1), unless the authorization is terminated or revoked.  Performed at [facility], [place] [address], [place], [place] [address]   Urine Culture     Status: Abnormal   Collection Time: 12/01/20  5:00 PM   Specimen: Urine, Random  Result Value Ref Range Status   Specimen Description URINE, RANDOM  Final   Special Requests   Final    NONE Performed at [facility], [place] [address] [place], [place] [address]    Culture (A)  Final    >=100,000 COLONIES/mL ENTEROBACTER AEROGENES >=100,000 COLONIES/mL ESCHERICHIA COLI    Report Status 12/04/2020 FINAL  Final   Organism ID, Bacteria ENTEROBACTER AEROGENES (A)  Final   Organism ID, Bacteria ESCHERICHIA COLI (A)  Final      Susceptibility   Enterobacter aerogenes - MIC*    CEFAZOLIN >=64 RESISTANT Resistant     CEFEPIME  <=0.12 SENSITIVE Sensitive     CEFTRIAXONE <=0.25 SENSITIVE Sensitive  CIPROFLOXACIN <=0.25 SENSITIVE Sensitive     GENTAMICIN <=1 SENSITIVE Sensitive     IMIPENEM 1 SENSITIVE Sensitive     NITROFURANTOIN 64 INTERMEDIATE Intermediate     TRIMETH/SULFA <=20 SENSITIVE Sensitive     PIP/TAZO <=4 SENSITIVE Sensitive     * >=100,000 COLONIES/mL ENTEROBACTER AEROGENES   Escherichia coli - MIC*    AMPICILLIN 4 SENSITIVE Sensitive     CEFAZOLIN <=4 SENSITIVE Sensitive     CEFEPIME <=0.12 SENSITIVE Sensitive     CEFTRIAXONE <=0.25 SENSITIVE Sensitive     CIPROFLOXACIN <=0.25 SENSITIVE Sensitive     GENTAMICIN <=1 SENSITIVE Sensitive     IMIPENEM <=0.25 SENSITIVE Sensitive     NITROFURANTOIN <=16 SENSITIVE Sensitive     TRIMETH/SULFA <=20 SENSITIVE Sensitive     AMPICILLIN/SULBACTAM <=2 SENSITIVE Sensitive     PIP/TAZO <=4 SENSITIVE Sensitive     * >=100,000 COLONIES/mL ESCHERICHIA COLI  Culture, Urine     Status: Abnormal   Collection Time: 12/03/20 11:23 AM   Specimen: Urine, Random  Result Value Ref Range Status   Specimen Description URINE, RANDOM  Final   Special Requests NONE  Final   Culture (A)  Final    <10,000 COLONIES/mL INSIGNIFICANT GROWTH Performed at [facility], [address], [place], [place] [address]    Report Status 12/05/2020 FINAL  Final  Culture, blood (routine x 2)     Status: None (Preliminary result)   Collection Time: 12/03/20 12:17 PM   Specimen: BLOOD RIGHT HAND  Result Value Ref Range Status   Specimen Description BLOOD RIGHT HAND  Final   Special Requests   Final    BOTTLES DRAWN AEROBIC AND ANAEROBIC Blood Culture adequate volume   Culture  Setup Time   Final    GRAM POSITIVE COCCI IN BOTH AEROBIC AND ANAEROBIC BOTTLES Gram Stain Report Called to,Read Back By and Verified With: DAVIS A. @ 0086 ON [id number] BY HENDERSON L. Organism ID to follow CRITICAL RESULT CALLED TO, READ BACK BY AND VERIFIED WITH: L CURRAN PHARMD @1943  12/04/20  EB Performed at [facility], [place] [address], [place], [place] [address]    Culture GRAM POSITIVE COCCI  Final   Report Status PENDING  Incomplete  Blood Culture ID Panel (Reflexed)     Status: Abnormal   Collection Time: 12/03/20 12:17 PM  Result Value Ref Range Status   Enterococcus faecalis NOT DETECTED NOT DETECTED Final   Enterococcus Faecium NOT DETECTED NOT DETECTED Final   Listeria monocytogenes NOT DETECTED NOT DETECTED Final   Staphylococcus species DETECTED (A) NOT DETECTED Final    Comment: CRITICAL RESULT CALLED TO, READ BACK BY AND VERIFIED WITH: L CURRAN PHARMD @1943  12/04/20 EB    Staphylococcus aureus (BCID) NOT DETECTED NOT DETECTED Final   Staphylococcus epidermidis DETECTED (A) NOT DETECTED Final    Comment: Methicillin (oxacillin) resistant coagulase negative staphylococcus. Possible blood culture contaminant (unless isolated from more than one blood culture draw or clinical case suggests pathogenicity). No antibiotic treatment is indicated for blood  culture contaminants. CRITICAL RESULT CALLED TO, READ BACK BY AND VERIFIED WITH: L CURRAN PHARMD @1943  12/04/20 EB    Staphylococcus lugdunensis NOT DETECTED NOT DETECTED Final   Streptococcus species NOT DETECTED NOT DETECTED Final   Streptococcus agalactiae NOT DETECTED NOT DETECTED Final   Streptococcus pneumoniae NOT DETECTED NOT DETECTED Final   Streptococcus pyogenes NOT DETECTED NOT DETECTED Final   A.calcoaceticus-baumannii NOT DETECTED NOT DETECTED Final   Bacteroides fragilis NOT DETECTED NOT DETECTED  Final   Enterobacterales NOT DETECTED NOT DETECTED Final   Enterobacter cloacae complex NOT DETECTED NOT DETECTED Final   Escherichia coli NOT DETECTED NOT DETECTED Final   Klebsiella aerogenes NOT DETECTED NOT DETECTED Final   Klebsiella oxytoca NOT DETECTED NOT DETECTED Final   Klebsiella pneumoniae NOT DETECTED NOT DETECTED Final   Proteus species NOT DETECTED NOT DETECTED Final   Salmonella  species NOT DETECTED NOT DETECTED Final   Serratia marcescens NOT DETECTED NOT DETECTED Final   Haemophilus influenzae NOT DETECTED NOT DETECTED Final   Neisseria meningitidis NOT DETECTED NOT DETECTED Final   Pseudomonas aeruginosa NOT DETECTED NOT DETECTED Final   Stenotrophomonas maltophilia NOT DETECTED NOT DETECTED Final   Candida albicans NOT DETECTED NOT DETECTED Final   Candida auris NOT DETECTED NOT DETECTED Final   Candida glabrata NOT DETECTED NOT DETECTED Final   Candida krusei NOT DETECTED NOT DETECTED Final   Candida parapsilosis NOT DETECTED NOT DETECTED Final   Candida tropicalis NOT DETECTED NOT DETECTED Final   Cryptococcus neoformans/gattii NOT DETECTED NOT DETECTED Final   Methicillin resistance mecA/C DETECTED (A) NOT DETECTED Final    Comment: CRITICAL RESULT CALLED TO, READ BACK BY AND VERIFIED WITH: L CURRAN PHARMD @1943  12/04/20 EB Performed at [facility], [address], [place], [place] [address]       Studies: No results found.  Scheduled Meds:  atorvastatin  20 mg Oral QPM   brimonidine  1 drop Both Eyes BID   dorzolamide-timolol  1 drop Both Eyes BID   gabapentin  600 mg Oral BID   insulin aspart  0-5 Units Subcutaneous QHS   insulin aspart  0-9 Units Subcutaneous TID WC   latanoprost  1 drop Both Eyes QHS   levothyroxine  88 mcg Oral QHS   memantine  10 mg Oral BID   multivitamin with minerals  1 tablet Oral Daily   Netarsudil Dimesylate  1 drop Both Eyes QHS   pantoprazole  40 mg Oral BID AC   tamsulosin  0.4 mg Oral BID    Continuous Infusions:  ceFEPime (MAXIPIME) IV Stopped (12/04/20 2051)     LOS: 10 days     Cristal Deer, MD [facility]  To reach me or the doctor on call, go to: [email or web address] Password [facility]  12/05/2020, 9:31 AM

## 2020-12-06 DIAGNOSIS — N179 Acute kidney failure, unspecified: Secondary | ICD-10-CM | POA: Diagnosis not present

## 2020-12-06 LAB — GLUCOSE, CAPILLARY
Glucose-Capillary: 136 mg/dL — ABNORMAL HIGH (ref 70–99)
Glucose-Capillary: 198 mg/dL — ABNORMAL HIGH (ref 70–99)
Glucose-Capillary: 212 mg/dL — ABNORMAL HIGH (ref 70–99)
Glucose-Capillary: 157 mg/dL — ABNORMAL HIGH (ref 70–99)

## 2020-12-06 LAB — CBC
HCT: 32.2 % — ABNORMAL LOW (ref 39.0–52.0)
Hemoglobin: 10.8 g/dL — ABNORMAL LOW (ref 13.0–17.0)
MCH: 31.1 pg (ref 26.0–34.0)
MCHC: 33.5 g/dL (ref 30.0–36.0)
MCV: 92.8 fL (ref 80.0–100.0)
Platelets: 346 10*3/uL (ref 150–400)
RBC: 3.47 MIL/uL — ABNORMAL LOW (ref 4.22–5.81)
RDW: 14 % (ref 11.5–15.5)
WBC: 14.9 10*3/uL — ABNORMAL HIGH (ref 4.0–10.5)
nRBC: 0 % (ref 0.0–0.2)

## 2020-12-06 LAB — BASIC METABOLIC PANEL
Anion gap: 10 (ref 5–15)
BUN: 19 mg/dL (ref 8–23)
CO2: 22 mmol/L (ref 22–32)
Calcium: 8.7 mg/dL — ABNORMAL LOW (ref 8.9–10.3)
Chloride: 100 mmol/L (ref 98–111)
Creatinine, Ser: 1.33 mg/dL — ABNORMAL HIGH (ref 0.61–1.24)
GFR, Estimated: 53 mL/min — ABNORMAL LOW (ref >60)
Glucose, Bld: 210 mg/dL — ABNORMAL HIGH (ref 70–99)
Potassium: 5.6 mmol/L — ABNORMAL HIGH (ref 3.5–5.1)
Sodium: 132 mmol/L — ABNORMAL LOW (ref 135–145)

## 2020-12-06 LAB — CULTURE, BLOOD (ROUTINE X 2)
Culture: NO GROWTH
Culture: NO GROWTH
Special Requests: ADEQUATE

## 2020-12-06 NOTE — Progress Notes (Addendum)
Physical Therapy Treatment Patient Details Name: Mike Mack MRN: [id number] DOB: 04/06/1936 Today's Date: 12/06/2020    History of Present Illness Pt presented to ED 6/8 with weakness and inability to ambulate. Pt found to have AKI. PMH - polio with chronic weakness of RLE, DM, HTN, anemia, CVA (residual RLE weakness after CVA per spouse).    PT Comments    Pt received in supine, agreeable to therapy session and with fair participation and tolerance for bed mobility and seated/supine exercises. Pt limited due to poor seated balance and c/o RLE fatigue also notably lethargic even after sitting EOB. Pt attempted seated scoot but unable to de-weight hips from bed to scoot and impulsive to lean backward with scooting attempt. Unable to assess standing transfers this date due to pt lethargy/fatigue and needing up to maxA +2 for bed mobility and mod/maxA for seated balance. Pt continues to benefit from PT services to progress toward functional mobility goals. Frequency updated to 2x/week per follow-up recommendations for SNF remain appropriate, discussed with supervising PT [facility].  Follow Up Recommendations  SNF     Equipment Recommendations  None recommended by PT;Other (comment) (defer to next location)    Recommendations for Other Services       Precautions / Restrictions Precautions Precautions: Fall Precaution Comments: chronic R LE dysfunction d/t childhood polio and CVA Required Braces or Orthoses: Knee Immobilizer - Right Knee Immobilizer - Right: Other (comment) (trial for RLE support during sit to stands does not need to wear at rest) Restrictions Weight Bearing Restrictions: No    Mobility  Bed Mobility Overal bed mobility: Needs Assistance Bed Mobility: Rolling;Sidelying to Sit;Sit to Sidelying Rolling: Max assist Sidelying to sit: Max assist     Sit to sidelying: Max assist;+2 for physical assistance General bed mobility comments: needing assist to elevate and  lower trunk, scoot hip to the edge of bed and maintain sitting balance. Increased assist on return to bed due to pt fatigue.    Transfers     Transfers: Lateral/Scoot Transfers          Lateral/Scoot Transfers: Max assist;Total assist;+2 physical assistance;+2 safety/equipment General transfer comment: declined transfer to recliner, RLE pain and lethargy throughout; attempted seated scooting toward [facility] but despite verbal/visual cues and transfer pad assist, pt unable to achieve any propulsion so needed posterior supine scoot totalA with bed pads  Ambulation/Gait             General Gait Details: too lethargic and unable to stand   Stairs             Wheelchair Mobility    Modified Rankin (Stroke Patients Only)       Balance Overall balance assessment: Needs assistance Sitting-balance support: Feet supported;Bilateral upper extremity supported Sitting balance-Leahy Scale: Poor Sitting balance - Comments: pt needs BUE support and mod to maxA for seated balance, progressed to min guard briefly but impulsive to lean due to lethargy/fatigue Postural control: Posterior lean;Right lateral lean     Standing balance comment: pt unable this date                            Cognition Arousal/Alertness: Lethargic Behavior During Therapy: Flat affect Overall Cognitive Status: Impaired/Different from baseline                         Following Commands: Follows one step commands inconsistently     Problem Solving: Decreased initiation;Difficulty  sequencing;Requires verbal cues;Requires tactile cues General Comments: Baseline cognitive impairment per chart. Able to follow directions sometimes but slower to process cues this date, per spouse he is more drowsy than normal but does nap often during the day at home, pt reports he "woke up at 2am" but unclear if he was up all night or not.      Exercises Other Exercises Other Exercises: seated BLE AROM:  hip flexion, ankle pumps (AROM) x10 reps ea Other Exercises: supine BLE AAROM: heel slides, hip abduction, ankle pumps (AROM) x10 reps ea Other Exercises: BUE AROM: arm bicycle x15 reps, chest press each arm, wrist flex/ext x10 reps    General Comments General comments (skin integrity, edema, etc.): BP 170/89 (110) seated EOB, BP 141/72 after return to supine; HR 94 bpm resting and to 107 bpm with static sitting; SpO2 96-100% on RA      Pertinent Vitals/Pain Pain Assessment: Faces Faces Pain Scale: Hurts even more Pain Location: with movement of RLE (especially R knee flexion/extension) and tactile pressure surrounding R knee joint Pain Descriptors / Indicators: Grimacing;Guarding;Moaning;Aching Pain Intervention(s): Limited activity within patient's tolerance;Monitored during session;Repositioned (RN notified)    Home Living                      Prior Function            PT Goals (current goals can now be found in the care plan section) Acute Rehab PT Goals Patient Stated Goal: none stated this date PT Goal Formulation: With patient Time For Goal Achievement: 12/09/20 Progress towards PT goals: Progressing toward goals (slow progress, pain/fatigue limiting)    Frequency    Min 2X/week      PT Plan Current plan remains appropriate;Frequency needs to be updated    Co-evaluation              AM-PAC PT "6 Clicks" Mobility   Outcome Measure  Help needed turning from your back to your side while in a flat bed without using bedrails?: A Lot Help needed moving from lying on your back to sitting on the side of a flat bed without using bedrails?: A Lot Help needed moving to and from a bed to a chair (including a wheelchair)?: Total Help needed standing up from a chair using your arms (e.g., wheelchair or bedside chair)?: Total Help needed to walk in hospital room?: Total Help needed climbing 3-5 steps with a railing? : Total 6 Click Score: 8    End of Session  Equipment Utilized During Treatment:  (KI noted to be on when PTA arrived to room but doffed for ROM/exercise, left off as pt only needs to wear with standing attempts) Activity Tolerance: Patient limited by pain;Patient limited by lethargy (R knee pain, lethargy) Patient left: in bed;with call bell/phone within reach;with bed alarm set;Other (comment) (bed in chair position, heels floated, pt requesting 4 rails up, pillow under R back/shoulder to maintain upright posture (R lean)) Nurse Communication: Mobility status;Need for lift equipment;Other (comment) (likely needs hoyer lift this date.) PT Visit Diagnosis: Unsteadiness on feet (R26.81);Other abnormalities of gait and mobility (R26.89);Muscle weakness (generalized) (M62.81)     Time: 1610-9604 PT Time Calculation (min) (ACUTE ONLY): 46 min  Charges:  $Therapeutic Exercise: 23-37 mins $Therapeutic Activity: 8-22 mins                     Karlon Schlafer P., PTA Acute Rehabilitation Services Pager: [phone] Office: [place]  12/06/2020, 2:19 PM

## 2020-12-06 NOTE — Progress Notes (Addendum)
PROGRESS NOTE  Mike Mack QZR:[id number] DOB: 12-02-1935 DOA: 11/24/2020 PCP: Jani Gravel, MD  HPI/Recap of past 24 hours: 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  Patient was initially put on IV fluid with improvement.  On November 29, 2020 he had a bowel movement that was black and he became more anemic GI was consulted and and he had EGD, EGD showed 4 ulcers in his stomach and duodenum.  He also spiked fevers and was found to have urinary tract infection his T-max of 101 current temperature is 99.8  Patient seen and examined at bedside he stated he is doing well except he is having some mild epigastric tenderness  UPDATE 12/05/20 Seen. C/o rt leg pain Nurse also reported elevated blood pressure he does not have any medications ordered  12/06/2020: Patient seen and examined at bedside his wife is at bedside.  His blood culture is growing Streptococcus hominis.  The significance of this is not known.  Assessment/Plan: Principal Problem:   AKI (acute kidney injury) (August) Active Problems:   Hypertension   BPH (benign prostatic hyperplasia)   Generalized weakness   S/P cholecystectomy   Monoparesis of leg ([place])   Controlled type 2 diabetes mellitus with stable proliferative retinopathy of both eyes, with long-term current use of insulin (HCC)   Dementia (HCC)   Polio osteopathy of lower leg, left ([place])  1.  Acute kidney injury on chronic kidney disease stage III A, resolving with IV fluid Current creatinine is 1.4  2.  Acute blood loss anemia due to gastric and duodenal  ulcer status post EGD continue PPI Protonix Status post 1 unit packed RBC transfusion His hemoglobin is 8.4 today  3.  Fever with urinary tract infection.  His T-max was 180 yesterday today is 99.8.  Patient was started on on ceftriaxone.  His blood culture is negative chest x-ray is clear his urine does show some urinary tract infection COVID is negative he has persistent leukocytosis but improving He had a renal ultrasound that did not show any hydronephrosis 1 bottle of his blood culture is growing Streptococcus hominis ,the significance of this is not known.  They are going to reculture   4.  BPH patient is on Flomax  5.  Insulin-dependent diabetes mellitus fairly controlled with A1c of 6.5 His Tyler Aas was held during his admission and he is on insulin sliding scale and he will maintain a low calorie diet  6.  Hyperlipidemia continue atorvastatin  7.  History of CVA with right hemiparesis   8.  Dementia continue memantine  9.  Hypothyroidism continue levothyroxine   10.  History of childhood polio with residual right lower extremity deformity  11.  Mild hypertension.  Patient's blood pressure is slightly elevated he is currently not on any antihypertensive he would benefit from ACE inhibitor due to his diabetes but his kidney function is abnormal so we will monitor and start him on antihypertensive if blood pressure continues to be elevated his blood pressures have been elevated and we are going to start him on his home dose of antihypertensives which is the metoprolol  and amlodipine.  I will give a one-time dose of hydralazine p.o.  12.  Failure to thrive.  Patient was evaluated by physical therapy and they recommending SNF  Code Status: Full  Severity of Illness: The appropriate patient status for this patient is INPATIENT. Inpatient status is judged to be reasonable and necessary in order to provide the required intensity of service to ensure the patient's safety. The patient's  presenting symptoms, physical exam findings, and initial radiographic and laboratory data in the context of their chronic comorbidities is felt to place them at high risk for further clinical deterioration. Furthermore, it is not anticipated that the patient will be medically stable for discharge from the hospital within 2 midnights of admission. The following factors support the patient status of inpatient.   " Admitted with acute kidney injury and currently still having fever One of his blood cultures is growing strep huminis and it is being recultured   * I certify that at the point of admission it is my clinical judgment that the patient will require inpatient hospital care spanning beyond 2 midnights from the point of admission due to high intensity of service, high risk for further deterioration and high frequency of surveillance required.*   Family Communication: Wife Dorothy at bedside  Disposition Plan: To SNF when stable   Consultants: GI  Procedures: EGD November 29, 2020  Antimicrobials: Ceftriaxone  DVT prophylaxis: SCD   Objective: Vitals:   12/06/20 0804 12/06/20 1143 12/06/20 1202 12/06/20 1551  BP: (!) 148/77 (!) 170/89 (!) 141/72 (!) 158/75  Pulse: 84 95 96 94  Resp: 16 14  14   Temp: 99 F (37.2 C) 99 F (37.2 C)  99.1 F (37.3 C)  TempSrc: Oral Oral  Oral  SpO2: 94% 96% 97% 98%  Weight:      Height:        Intake/Output Summary (Last 24 hours) at 12/06/2020 1816 Last data filed at 12/06/2020 1552 Gross per 24 hour  Intake 480 ml  Output 1200 ml  Net -720 ml    Filed Weights   11/30/20 1130 12/05/20 0420 12/06/20 0317  Weight: 84.5 kg 84.5 kg 84.5 kg   Body mass index is 31 kg/m.  Exam:  General: 85 y.o. year-old male well developed well nourished in no acute distress.  Alert and oriented x3.  Slightly obese Cardiovascular: Regular rate and rhythm with no rubs or gallops.  No thyromegaly or JVD noted.   Respiratory: Clear to auscultation with  no wheezes or rales. Good inspiratory effort. Abdomen: Soft mild epigastric tenderness, nondistended with normal bowel sounds x4 quadrants. Musculoskeletal: Bilateral lower extremity edema. 2/4 pulses in all 4 extremities. Right lower extremity deformity Skin: No ulcerative lesions noted or rashes, Psychiatry: Mood is appropriate for condition and setting Neurology  Right hemiparesis     Data Reviewed: CBC: Recent Labs  Lab 11/30/20 0535 12/01/20 0136 12/02/20 0026 12/04/20 0127 12/06/20 1209  WBC 10.1 12.7* 14.2* 11.7* 14.9*  NEUTROABS  --   --   --  9.4*  --   HGB 8.5* 8.7* 8.3* 8.4* 10.8*  HCT 25.4* 26.0* 24.2* 25.4* 32.2*  MCV 94.8 94.9 92.7 94.4 92.8  PLT 264 279 284 292 539    Basic Metabolic Panel: Recent Labs  Lab 11/30/20 0535 12/01/20 0136 12/04/20 0127 12/06/20 1209  NA 133* 134* 135 132*  K 4.5 4.7 4.2 5.6*  CL 107 104 104 100  CO2 22 23 24 22   GLUCOSE 118* 167* 186* 210*  BUN 22 20 17 19   CREATININE 1.46* 1.39* 1.40* 1.33*  CALCIUM 8.1* 8.2* 8.0* 8.7*    GFR: Estimated Creatinine Clearance: 42.8 mL/min (A) (by C-G formula based on SCr of 1.33 mg/dL (H)). Liver Function Tests: No results for input(s): AST, ALT, ALKPHOS, BILITOT, PROT, ALBUMIN in the last 168 hours. No results for input(s): LIPASE, AMYLASE in the last 168 hours. No results for input(s): AMMONIA in the last 168 hours. Coagulation Profile: No results for input(s): INR, PROTIME in the last 168 hours. Cardiac Enzymes: No results for input(s): CKTOTAL, CKMB, CKMBINDEX, TROPONINI in the last 168 hours. BNP (last 3 results) No results for input(s): PROBNP in the last 8760 hours. HbA1C: No results for input(s): HGBA1C in the last 72 hours. CBG: Recent Labs  Lab 12/05/20 1727 12/05/20 2119 12/06/20 0554 12/06/20 1149 12/06/20 1550  GLUCAP 158* 135* 136* 198* 212*    Lipid Profile: No results for input(s): CHOL, HDL, LDLCALC, TRIG, CHOLHDL, LDLDIRECT in the last 72  hours. Thyroid Function Tests: No results for input(s): TSH, T4TOTAL, FREET4, T3FREE, THYROIDAB in the last 72 hours. Anemia Panel: No results for input(s): VITAMINB12, FOLATE, FERRITIN, TIBC, IRON, RETICCTPCT in the last 72 hours. Urine analysis:    Component Value Date/Time   COLORURINE YELLOW 12/03/2020 1750   APPEARANCEUR CLEAR 12/03/2020 1750   LABSPEC 1.017 12/03/2020 1750   PHURINE 5.0 12/03/2020 1750   GLUCOSEU NEGATIVE 12/03/2020 1750   HGBUR NEGATIVE 12/03/2020 1750   BILIRUBINUR NEGATIVE 12/03/2020 1750   KETONESUR NEGATIVE 12/03/2020 1750   PROTEINUR 100 (A) 12/03/2020 1750   UROBILINOGEN 0.2 10/25/2014 1520   NITRITE NEGATIVE 12/03/2020 1750   LEUKOCYTESUR NEGATIVE 12/03/2020 1750   Sepsis Labs: @LABRCNTIP (procalcitonin:4,lacticidven:4)  ) Recent Results (from the past 240 hour(s))  Culture, blood (routine x 2)     Status: None   Collection Time: 11/30/20  5:44 AM   Specimen: BLOOD  Result Value Ref Range Status   Specimen Description BLOOD RIGHT ARM  Final   Special Requests   Final    BOTTLES DRAWN AEROBIC AND ANAEROBIC Blood Culture adequate volume   Culture   Final    NO GROWTH 5 DAYS Performed at [facility], [place] [address] [place], [place] [address]    Report Status 12/05/2020 FINAL  Final  Culture, blood (routine x 2)     Status: None   Collection Time: 11/30/20  5:44 AM   Specimen: BLOOD LEFT HAND  Result Value Ref Range Status   Specimen Description BLOOD LEFT HAND  Final   Special Requests   Final    BOTTLES DRAWN AEROBIC AND ANAEROBIC Blood Culture adequate volume   Culture   Final    NO GROWTH 5 DAYS Performed at [facility], [place] [address], [place], [place] [address]    Report Status 12/05/2020 FINAL  Final  Culture, blood (routine x 2)     Status: None   Collection Time: 12/01/20  4:09 AM   Specimen: BLOOD RIGHT FOREARM  Result Value Ref Range Status   Specimen Description BLOOD RIGHT FOREARM  Final   Special Requests    Final    BOTTLES DRAWN AEROBIC AND ANAEROBIC Blood Culture adequate volume   Culture   Final    NO GROWTH 5 DAYS Performed at [facility], [place] [address], [place], [place] [address]    Report Status 12/06/2020 FINAL  Final  Culture, blood (routine x 2)     Status: None   Collection  Time: 12/01/20  4:14 AM   Specimen: BLOOD RIGHT HAND  Result Value Ref Range Status   Specimen Description BLOOD RIGHT HAND  Final   Special Requests   Final    BOTTLES DRAWN AEROBIC AND ANAEROBIC Blood Culture results may not be optimal due to an inadequate volume of blood received in culture bottles   Culture   Final    NO GROWTH 5 DAYS Performed at [facility], [place] [address] [place], [place] [address]    Report Status 12/06/2020 FINAL  Final  Resp Panel by RT-PCR (Flu A&B, Covid)     Status: None   Collection Time: 12/01/20  2:46 PM   Specimen: Nasopharyngeal(NP) swabs in vial transport medium  Result Value Ref Range Status   SARS Coronavirus 2 by RT PCR NEGATIVE NEGATIVE Final    Comment: (NOTE) SARS-CoV-2 target nucleic acids are NOT DETECTED.  The SARS-CoV-2 RNA is generally detectable in upper respiratory specimens during the acute phase of infection. The lowest concentration of SARS-CoV-2 viral copies this assay can detect is 138 copies/mL. A negative result does not preclude SARS-Cov-2 infection and should not be used as the sole basis for treatment or other patient management decisions. A negative result may occur with  improper specimen collection/handling, submission of specimen other than nasopharyngeal swab, presence of viral mutation(s) within the areas targeted by this assay, and inadequate number of viral copies(<138 copies/mL). A negative result must be combined with clinical observations, patient history, and epidemiological information. The expected result is Negative.  Fact Sheet for Patients:  EntrepreneurPulse.com.au  Fact Sheet for  Healthcare Providers:  IncredibleEmployment.be  This test is no t yet approved or cleared by the [place] FDA and  has been authorized for detection and/or diagnosis of SARS-CoV-2 by FDA under an Emergency Use Authorization (EUA). This EUA will remain  in effect (meaning this test can be used) for the duration of the COVID-19 declaration under Section 564(b)(1) of the Act, 21 U.S.C.section 360bbb-3(b)(1), unless the authorization is terminated  or revoked sooner.       Influenza A by PCR NEGATIVE NEGATIVE Final   Influenza B by PCR NEGATIVE NEGATIVE Final    Comment: (NOTE) The Xpert Xpress SARS-CoV-2/FLU/RSV plus assay is intended as an aid in the diagnosis of influenza from Nasopharyngeal swab specimens and should not be used as a sole basis for treatment. Nasal washings and aspirates are unacceptable for Xpert Xpress SARS-CoV-2/FLU/RSV testing.  Fact Sheet for Patients: EntrepreneurPulse.com.au  Fact Sheet for Healthcare Providers: IncredibleEmployment.be  This test is not yet approved or cleared by the [place] FDA and has been authorized for detection and/or diagnosis of SARS-CoV-2 by FDA under an Emergency Use Authorization (EUA). This EUA will remain in effect (meaning this test can be used) for the duration of the COVID-19 declaration under Section 564(b)(1) of the Act, 21 U.S.C. section 360bbb-3(b)(1), unless the authorization is terminated or revoked.  Performed at [facility], [place] [address], [place], [place] [address]   Urine Culture     Status: Abnormal   Collection Time: 12/01/20  5:00 PM   Specimen: Urine, Random  Result Value Ref Range Status   Specimen Description URINE, RANDOM  Final   Special Requests   Final    NONE Performed at [facility], [place] [address], [place], [place] [address]    Culture (A)  Final    >=100,000 COLONIES/mL ENTEROBACTER AEROGENES >=100,000  COLONIES/mL ESCHERICHIA COLI    Report Status  12/04/2020 FINAL  Final   Organism ID, Bacteria ENTEROBACTER AEROGENES (A)  Final   Organism ID, Bacteria ESCHERICHIA COLI (A)  Final      Susceptibility   Enterobacter aerogenes - MIC*    CEFAZOLIN >=64 RESISTANT Resistant     CEFEPIME <=0.12 SENSITIVE Sensitive     CEFTRIAXONE <=0.25 SENSITIVE Sensitive     CIPROFLOXACIN <=0.25 SENSITIVE Sensitive     GENTAMICIN <=1 SENSITIVE Sensitive     IMIPENEM 1 SENSITIVE Sensitive     NITROFURANTOIN 64 INTERMEDIATE Intermediate     TRIMETH/SULFA <=20 SENSITIVE Sensitive     PIP/TAZO <=4 SENSITIVE Sensitive     * >=100,000 COLONIES/mL ENTEROBACTER AEROGENES   Escherichia coli - MIC*    AMPICILLIN 4 SENSITIVE Sensitive     CEFAZOLIN <=4 SENSITIVE Sensitive     CEFEPIME <=0.12 SENSITIVE Sensitive     CEFTRIAXONE <=0.25 SENSITIVE Sensitive     CIPROFLOXACIN <=0.25 SENSITIVE Sensitive     GENTAMICIN <=1 SENSITIVE Sensitive     IMIPENEM <=0.25 SENSITIVE Sensitive     NITROFURANTOIN <=16 SENSITIVE Sensitive     TRIMETH/SULFA <=20 SENSITIVE Sensitive     AMPICILLIN/SULBACTAM <=2 SENSITIVE Sensitive     PIP/TAZO <=4 SENSITIVE Sensitive     * >=100,000 COLONIES/mL ESCHERICHIA COLI  Culture, Urine     Status: Abnormal   Collection Time: 12/03/20 11:23 AM   Specimen: Urine, Random  Result Value Ref Range Status   Specimen Description URINE, RANDOM  Final   Special Requests NONE  Final   Culture (A)  Final    <10,000 COLONIES/mL INSIGNIFICANT GROWTH Performed at [facility], [address] [place], [place] [address]    Report Status 12/05/2020 FINAL  Final  Culture, blood (routine x 2)     Status: Abnormal (Preliminary result)   Collection Time: 12/03/20 12:17 PM   Specimen: BLOOD RIGHT HAND  Result Value Ref Range Status   Specimen Description BLOOD RIGHT HAND  Final   Special Requests   Final    BOTTLES DRAWN AEROBIC AND ANAEROBIC Blood Culture adequate volume   Culture  Setup Time    Final    GRAM POSITIVE COCCI IN BOTH AEROBIC AND ANAEROBIC BOTTLES Gram Stain Report Called to,Read Back By and Verified With: DAVIS A. @ 2202 ON [id number] BY HENDERSON L. Organism ID to follow CRITICAL RESULT CALLED TO, READ BACK BY AND VERIFIED WITH: L CURRAN PHARMD @1943  12/04/20 EB    Culture (A)  Final    STAPHYLOCOCCUS HOMINIS THE SIGNIFICANCE OF ISOLATING THIS ORGANISM FROM A SINGLE SET OF BLOOD CULTURES WHEN MULTIPLE SETS ARE DRAWN IS UNCERTAIN. PLEASE NOTIFY THE MICROBIOLOGY DEPARTMENT WITHIN ONE WEEK IF SPECIATION AND SENSITIVITIES ARE REQUIRED. CULTURE REINCUBATED FOR BETTER GROWTH Performed at [facility], [place] [address], [place], [place] [address]    Report Status PENDING  Incomplete  Blood Culture ID Panel (Reflexed)     Status: Abnormal   Collection Time: 12/03/20 12:17 PM  Result Value Ref Range Status   Enterococcus faecalis NOT DETECTED NOT DETECTED Final   Enterococcus Faecium NOT DETECTED NOT DETECTED Final   Listeria monocytogenes NOT DETECTED NOT DETECTED Final   Staphylococcus species DETECTED (A) NOT DETECTED Final    Comment: CRITICAL RESULT CALLED TO, READ BACK BY AND VERIFIED WITH: L CURRAN PHARMD @1943  12/04/20 EB    Staphylococcus aureus (BCID) NOT DETECTED NOT DETECTED Final   Staphylococcus epidermidis DETECTED (A) NOT DETECTED Final    Comment: Methicillin (oxacillin) resistant coagulase negative staphylococcus.  Possible blood culture contaminant (unless isolated from more than one blood culture draw or clinical case suggests pathogenicity). No antibiotic treatment is indicated for blood  culture contaminants. CRITICAL RESULT CALLED TO, READ BACK BY AND VERIFIED WITH: L CURRAN PHARMD @1943  12/04/20 EB    Staphylococcus lugdunensis NOT DETECTED NOT DETECTED Final   Streptococcus species NOT DETECTED NOT DETECTED Final   Streptococcus agalactiae NOT DETECTED NOT DETECTED Final   Streptococcus pneumoniae NOT DETECTED NOT DETECTED Final    Streptococcus pyogenes NOT DETECTED NOT DETECTED Final   A.calcoaceticus-baumannii NOT DETECTED NOT DETECTED Final   Bacteroides fragilis NOT DETECTED NOT DETECTED Final   Enterobacterales NOT DETECTED NOT DETECTED Final   Enterobacter cloacae complex NOT DETECTED NOT DETECTED Final   Escherichia coli NOT DETECTED NOT DETECTED Final   Klebsiella aerogenes NOT DETECTED NOT DETECTED Final   Klebsiella oxytoca NOT DETECTED NOT DETECTED Final   Klebsiella pneumoniae NOT DETECTED NOT DETECTED Final   Proteus species NOT DETECTED NOT DETECTED Final   Salmonella species NOT DETECTED NOT DETECTED Final   Serratia marcescens NOT DETECTED NOT DETECTED Final   Haemophilus influenzae NOT DETECTED NOT DETECTED Final   Neisseria meningitidis NOT DETECTED NOT DETECTED Final   Pseudomonas aeruginosa NOT DETECTED NOT DETECTED Final   Stenotrophomonas maltophilia NOT DETECTED NOT DETECTED Final   Candida albicans NOT DETECTED NOT DETECTED Final   Candida auris NOT DETECTED NOT DETECTED Final   Candida glabrata NOT DETECTED NOT DETECTED Final   Candida krusei NOT DETECTED NOT DETECTED Final   Candida parapsilosis NOT DETECTED NOT DETECTED Final   Candida tropicalis NOT DETECTED NOT DETECTED Final   Cryptococcus neoformans/gattii NOT DETECTED NOT DETECTED Final   Methicillin resistance mecA/C DETECTED (A) NOT DETECTED Final    Comment: CRITICAL RESULT CALLED TO, READ BACK BY AND VERIFIED WITH: L CURRAN PHARMD @1943  12/04/20 EB Performed at [facility], [address] [place], [place] [address]       Studies: No results found.  Scheduled Meds:  amLODipine  5 mg Oral Daily   atorvastatin  20 mg Oral QPM   brimonidine  1 drop Both Eyes BID   dorzolamide-timolol  1 drop Both Eyes BID   gabapentin  600 mg Oral BID   insulin aspart  0-5 Units Subcutaneous QHS   insulin aspart  0-9 Units Subcutaneous TID WC   latanoprost  1 drop Both Eyes QHS   levothyroxine  88 mcg Oral QHS   memantine  10  mg Oral BID   metoprolol tartrate  25 mg Oral BID   multivitamin with minerals  1 tablet Oral Daily   Netarsudil Dimesylate  1 drop Both Eyes QHS   pantoprazole  40 mg Oral BID AC   tamsulosin  0.4 mg Oral BID    Continuous Infusions:  ceFEPime (MAXIPIME) IV 2 g (12/06/20 0944)     LOS: 11 days     Cristal Deer, MD [facility]  To reach me or the doctor on call, go to: [email or web address] Password [facility]  12/06/2020, 6:16 PM

## 2020-12-07 DIAGNOSIS — N179 Acute kidney failure, unspecified: Secondary | ICD-10-CM | POA: Diagnosis not present

## 2020-12-07 LAB — CULTURE, BLOOD (ROUTINE X 2): Special Requests: ADEQUATE

## 2020-12-07 LAB — GLUCOSE, CAPILLARY
Glucose-Capillary: 153 mg/dL — ABNORMAL HIGH (ref 70–99)
Glucose-Capillary: 169 mg/dL — ABNORMAL HIGH (ref 70–99)
Glucose-Capillary: 185 mg/dL — ABNORMAL HIGH (ref 70–99)
Glucose-Capillary: 152 mg/dL — ABNORMAL HIGH (ref 70–99)

## 2020-12-07 NOTE — Progress Notes (Signed)
PROGRESS NOTE  Mike Mack JGG:[id number] DOB: 1936/03/02 DOA: 11/24/2020 PCP: Jani Gravel, MD  HPI/Recap of past 24 hours: Mike Mack is a 85 year old African-American male with diabetes mellitus hypertension, BPH, chronic kidney disease stage IIIa, CVA, cognitive decline, childhood polio with right lower extremity atrophy and weakness and neuropathy who presented with generalized weakness that have been going on for 2 days for and found to have AKI with azotemia creatinine at the time of admission was 2.5 which is an increase from baseline of 1.3 and his BUN was in the 80s.  Patient was initially put on IV fluid with improvement.  On November 29, 2020 he had a bowel movement that was black and he became more anemic GI was consulted and and he had EGD, EGD showed 4 ulcers in his stomach and duodenum.  He also spiked fevers and was found to have urinary tract infection his T-max of 101 current temperature is 99.8  Patient seen and examined at bedside he stated he is doing well except he is having some mild epigastric tenderness  UPDATE 12/05/20 Seen. C/o rt leg pain Nurse also reported elevated blood pressure he does not have any medications ordered  12/06/2020: Patient seen and examined at bedside his wife is at bedside.  His blood culture is growing Streptococcus hominis.  The significance of this is not known.  December 07, 2020 Patient seen and examined at bedside he is complaining that he had a headache but he feels better after receiving Tylenol.  Assessment/Plan: Principal Problem:   AKI (acute kidney injury) ([place]) Active Problems:   Hypertension   BPH (benign prostatic hyperplasia)   Generalized weakness   S/P cholecystectomy   Monoparesis of leg ([place])   Controlled type 2 diabetes mellitus with stable proliferative retinopathy of both eyes, with long-term current use of insulin (HCC)   Dementia (HCC)   Polio osteopathy of lower leg, left ([place])  1.  Acute kidney injury on chronic  kidney disease stage III A, resolving with IV fluid Current creatinine is 1.4  2.  Acute blood loss anemia due to gastric and duodenal ulcer status post EGD continue PPI Protonix Status post 1 unit packed RBC transfusion His hemoglobin is 8.4 today  3.  Fever with urinary tract infection.  His T-max was 180 yesterday today is 99.8.  Patient was started on on ceftriaxone.  His blood culture is negative chest x-ray is clear his urine does show some urinary tract infection COVID is negative he has persistent leukocytosis but improving He had a renal ultrasound that did not show any hydronephrosis 1 bottle of his blood culture is growing Streptococcus hominis.  As well as staph Staphylococcus epididymis and capitis ,the significance of this is not known.  We will obtain infectious disease input   4.  BPH patient is on Flomax  5.  Insulin-dependent diabetes mellitus fairly controlled with A1c of 6.5 His Tyler Aas was held during his admission and he is on insulin sliding scale and he will maintain a low calorie diet  6.  Hyperlipidemia continue atorvastatin  7.  History of CVA with right hemiparesis   8.  Dementia continue memantine  9.  Hypothyroidism continue levothyroxine   10.  History of childhood polio with residual right lower extremity deformity  11.  Mild hypertension.  Patient's blood pressure is slightly elevated he is currently not on any antihypertensive he would benefit from ACE inhibitor due to his diabetes but his kidney function is abnormal so we will monitor  and start him on antihypertensive if blood pressure continues to be elevated his blood pressures have been elevated and we are going to start him on his home dose of antihypertensives which is the metoprolol and amlodipine.  I will give a one-time dose of hydralazine p.o.  12.  Failure to thrive.  Patient was evaluated by physical therapy and they recommending SNF  Code Status: Full  Severity of Illness: The  appropriate patient status for this patient is INPATIENT. Inpatient status is judged to be reasonable and necessary in order to provide the required intensity of service to ensure the patient's safety. The patient's presenting symptoms, physical exam findings, and initial radiographic and laboratory data in the context of their chronic comorbidities is felt to place them at high risk for further clinical deterioration. Furthermore, it is not anticipated that the patient will be medically stable for discharge from the hospital within 2 midnights of admission. The following factors support the patient status of inpatient.   " Admitted with acute kidney injury and currently still having fever One of his blood cultures is growing strep huminis and it is being recultured   * I certify that at the point of admission it is my clinical judgment that the patient will require inpatient hospital care spanning beyond 2 midnights from the point of admission due to high intensity of service, high risk for further deterioration and high frequency of surveillance required.*   Family Communication: Wife Mike Mack at bedside  Disposition Plan: To SNF when medically stable.   Consultants: GI  Procedures: EGD November 29, 2020  Antimicrobials: Ceftriaxone  DVT prophylaxis: SCD   Objective: Vitals:   12/07/20 0446 12/07/20 0722 12/07/20 1139 12/07/20 1539  BP: (!) 145/81 125/61 (!) 109/51 (!) 115/54  Pulse: 89 82 64 67  Resp: 18 18 19 19   Temp: 99.3 F (37.4 C) 99.2 F (37.3 C) 99.1 F (37.3 C) 99 F (37.2 C)  TempSrc: Oral Oral Oral Oral  SpO2: 96% 96% 97% 96%  Weight:      Height:        Intake/Output Summary (Last 24 hours) at 12/07/2020 1854 Last data filed at 12/07/2020 1500 Gross per 24 hour  Intake 480 ml  Output 400 ml  Net 80 ml    Filed Weights   11/30/20 1130 12/05/20 0420 12/06/20 0317  Weight: 84.5 kg 84.5 kg 84.5 kg   Body mass index is 31 kg/m.  Exam:  General: 85 y.o.  year-old male well developed well nourished in no acute distress.  Alert and oriented x3.  Slightly obese Cardiovascular: Regular rate and rhythm with no rubs or gallops.  No thyromegaly or JVD noted.   Respiratory: Clear to auscultation with no wheezes or rales. Good inspiratory effort. Abdomen: Soft mild epigastric tenderness, nondistended with normal bowel sounds x4 quadrants. Musculoskeletal: Bilateral lower extremity edema. 2/4 pulses in all 4 extremities. Right lower extremity deformity Skin: No ulcerative lesions noted or rashes, Psychiatry: Mood is appropriate for condition and setting Neurology  Right hemiparesis     Data Reviewed: CBC: Recent Labs  Lab 12/01/20 0136 12/02/20 0026 12/04/20 0127 12/06/20 1209  WBC 12.7* 14.2* 11.7* 14.9*  NEUTROABS  --   --  9.4*  --   HGB 8.7* 8.3* 8.4* 10.8*  HCT 26.0* 24.2* 25.4* 32.2*  MCV 94.9 92.7 94.4 92.8  PLT 279 284 292 034    Basic Metabolic Panel: Recent Labs  Lab 12/01/20 0136 12/04/20 0127 12/06/20 1209  NA 134* 135 132*  K 4.7 4.2 5.6*  CL 104 104 100  CO2 23 24 22   GLUCOSE 167* 186* 210*  BUN 20 17 19   CREATININE 1.39* 1.40* 1.33*  CALCIUM 8.2* 8.0* 8.7*    GFR: Estimated Creatinine Clearance: 42.8 mL/min (A) (by C-G formula based on SCr of 1.33 mg/dL (H)). Liver Function Tests: No results for input(s): AST, ALT, ALKPHOS, BILITOT, PROT, ALBUMIN in the last 168 hours. No results for input(s): LIPASE, AMYLASE in the last 168 hours. No results for input(s): AMMONIA in the last 168 hours. Coagulation Profile: No results for input(s): INR, PROTIME in the last 168 hours. Cardiac Enzymes: No results for input(s): CKTOTAL, CKMB, CKMBINDEX, TROPONINI in the last 168 hours. BNP (last 3 results) No results for input(s): PROBNP in the last 8760 hours. HbA1C: No results for input(s): HGBA1C in the last 72 hours. CBG: Recent Labs  Lab 12/06/20 1550 12/06/20 2038 12/07/20 0608 12/07/20 1134 12/07/20 1626   GLUCAP 212* 157* 153* 169* 185*    Lipid Profile: No results for input(s): CHOL, HDL, LDLCALC, TRIG, CHOLHDL, LDLDIRECT in the last 72 hours. Thyroid Function Tests: No results for input(s): TSH, T4TOTAL, FREET4, T3FREE, THYROIDAB in the last 72 hours. Anemia Panel: No results for input(s): VITAMINB12, FOLATE, FERRITIN, TIBC, IRON, RETICCTPCT in the last 72 hours. Urine analysis:    Component Value Date/Time   COLORURINE YELLOW 12/03/2020 1750   APPEARANCEUR CLEAR 12/03/2020 1750   LABSPEC 1.017 12/03/2020 1750   PHURINE 5.0 12/03/2020 1750   GLUCOSEU NEGATIVE 12/03/2020 1750   HGBUR NEGATIVE 12/03/2020 1750   BILIRUBINUR NEGATIVE 12/03/2020 1750   KETONESUR NEGATIVE 12/03/2020 1750   PROTEINUR 100 (A) 12/03/2020 1750   UROBILINOGEN 0.2 10/25/2014 1520   NITRITE NEGATIVE 12/03/2020 1750   LEUKOCYTESUR NEGATIVE 12/03/2020 1750   Sepsis Labs: @LABRCNTIP (procalcitonin:4,lacticidven:4)  ) Recent Results (from the past 240 hour(s))  Culture, blood (routine x 2)     Status: None   Collection Time: 11/30/20  5:44 AM   Specimen: BLOOD  Result Value Ref Range Status   Specimen Description BLOOD RIGHT ARM  Final   Special Requests   Final    BOTTLES DRAWN AEROBIC AND ANAEROBIC Blood Culture adequate volume   Culture   Final    NO GROWTH 5 DAYS Performed at [facility], [place] [address], [place], [place] [address]    Report Status 12/05/2020 FINAL  Final  Culture, blood (routine x 2)     Status: None   Collection Time: 11/30/20  5:44 AM   Specimen: BLOOD LEFT HAND  Result Value Ref Range Status   Specimen Description BLOOD LEFT HAND  Final   Special Requests   Final    BOTTLES DRAWN AEROBIC AND ANAEROBIC Blood Culture adequate volume   Culture   Final    NO GROWTH 5 DAYS Performed at [facility], [place] [address], [place], [place] [address]    Report Status 12/05/2020 FINAL  Final  Culture, blood (routine x 2)     Status: None   Collection Time: 12/01/20  4:09  AM   Specimen: BLOOD RIGHT FOREARM  Result Value Ref Range Status   Specimen Description BLOOD RIGHT FOREARM  Final   Special Requests   Final    BOTTLES DRAWN AEROBIC AND ANAEROBIC Blood Culture adequate volume   Culture   Final    NO GROWTH 5 DAYS Performed at [facility], [place] [address], [place], [place] [address]    Report Status 12/06/2020 FINAL  Final  Culture, blood (routine x 2)     Status: None   Collection Time: 12/01/20  4:14 AM   Specimen: BLOOD RIGHT HAND  Result Value Ref Range Status   Specimen Description BLOOD RIGHT HAND  Final   Special Requests   Final    BOTTLES DRAWN AEROBIC AND ANAEROBIC Blood Culture results may not be optimal due to an inadequate volume of blood received in culture bottles   Culture   Final    NO GROWTH 5 DAYS Performed at [facility], [place] [address] [place], [place] [address]    Report Status 12/06/2020 FINAL  Final  Resp Panel by RT-PCR (Flu A&B, Covid)     Status: None   Collection Time: 12/01/20  2:46 PM   Specimen: Nasopharyngeal(NP) swabs in vial transport medium  Result Value Ref Range Status   SARS Coronavirus 2 by RT PCR NEGATIVE NEGATIVE Final    Comment: (NOTE) SARS-CoV-2 target nucleic acids are NOT DETECTED.  The SARS-CoV-2 RNA is generally detectable in upper respiratory specimens during the acute phase of infection. The lowest concentration of SARS-CoV-2 viral copies this assay can detect is 138 copies/mL. A negative result does not preclude SARS-Cov-2 infection and should not be used as the sole basis for treatment or other patient management decisions. A negative result may occur with  improper specimen collection/handling, submission of specimen other than nasopharyngeal swab, presence of viral mutation(s) within the areas targeted by this assay, and inadequate number of viral copies(<138 copies/mL). A negative result must be combined with clinical observations, patient history, and  epidemiological information. The expected result is Negative.  Fact Sheet for Patients:  EntrepreneurPulse.com.au  Fact Sheet for Healthcare Providers:  IncredibleEmployment.be  This test is no t yet approved or cleared by the [place] FDA and  has been authorized for detection and/or diagnosis of SARS-CoV-2 by FDA under an Emergency Use Authorization (EUA). This EUA will remain  in effect (meaning this test can be used) for the duration of the COVID-19 declaration under Section 564(b)(1) of the Act, 21 U.S.C.section 360bbb-3(b)(1), unless the authorization is terminated  or revoked sooner.       Influenza A by PCR NEGATIVE NEGATIVE Final   Influenza B by PCR NEGATIVE NEGATIVE Final    Comment: (NOTE) The Xpert Xpress SARS-CoV-2/FLU/RSV plus assay is intended as an aid in the diagnosis of influenza from Nasopharyngeal swab specimens and should not be used as a sole basis for treatment. Nasal washings and aspirates are unacceptable for Xpert Xpress SARS-CoV-2/FLU/RSV testing.  Fact Sheet for Patients: EntrepreneurPulse.com.au  Fact Sheet for Healthcare Providers: IncredibleEmployment.be  This test is not yet approved or cleared by the [place] FDA and has been authorized for detection and/or diagnosis of SARS-CoV-2 by FDA under an Emergency Use Authorization (EUA). This EUA will remain in effect (meaning this test can be used) for the duration of the COVID-19 declaration under Section 564(b)(1) of the Act, 21 U.S.C. section 360bbb-3(b)(1), unless the authorization is terminated or revoked.  Performed at [facility], [place] [address], [place], [place] [address]   Urine Culture     Status: Abnormal   Collection Time: 12/01/20  5:00 PM   Specimen: Urine, Random  Result Value Ref Range Status   Specimen Description URINE, RANDOM  Final   Special Requests   Final    NONE Performed at  [facility], [place] [address] [place], [place] [address]    Culture (A)  Final    >=  100,000 COLONIES/mL ENTEROBACTER AEROGENES >=100,000 COLONIES/mL ESCHERICHIA COLI    Report Status 12/04/2020 FINAL  Final   Organism ID, Bacteria ENTEROBACTER AEROGENES (A)  Final   Organism ID, Bacteria ESCHERICHIA COLI (A)  Final      Susceptibility   Enterobacter aerogenes - MIC*    CEFAZOLIN >=64 RESISTANT Resistant     CEFEPIME <=0.12 SENSITIVE Sensitive     CEFTRIAXONE <=0.25 SENSITIVE Sensitive     CIPROFLOXACIN <=0.25 SENSITIVE Sensitive     GENTAMICIN <=1 SENSITIVE Sensitive     IMIPENEM 1 SENSITIVE Sensitive     NITROFURANTOIN 64 INTERMEDIATE Intermediate     TRIMETH/SULFA <=20 SENSITIVE Sensitive     PIP/TAZO <=4 SENSITIVE Sensitive     * >=100,000 COLONIES/mL ENTEROBACTER AEROGENES   Escherichia coli - MIC*    AMPICILLIN 4 SENSITIVE Sensitive     CEFAZOLIN <=4 SENSITIVE Sensitive     CEFEPIME <=0.12 SENSITIVE Sensitive     CEFTRIAXONE <=0.25 SENSITIVE Sensitive     CIPROFLOXACIN <=0.25 SENSITIVE Sensitive     GENTAMICIN <=1 SENSITIVE Sensitive     IMIPENEM <=0.25 SENSITIVE Sensitive     NITROFURANTOIN <=16 SENSITIVE Sensitive     TRIMETH/SULFA <=20 SENSITIVE Sensitive     AMPICILLIN/SULBACTAM <=2 SENSITIVE Sensitive     PIP/TAZO <=4 SENSITIVE Sensitive     * >=100,000 COLONIES/mL ESCHERICHIA COLI  Culture, Urine     Status: Abnormal   Collection Time: 12/03/20 11:23 AM   Specimen: Urine, Random  Result Value Ref Range Status   Specimen Description URINE, RANDOM  Final   Special Requests NONE  Final   Culture (A)  Final    <10,000 COLONIES/mL INSIGNIFICANT GROWTH Performed at [facility], [place] [address] [place], [place] [address]    Report Status 12/05/2020 FINAL  Final  Culture, blood (routine x 2)     Status: Abnormal   Collection Time: 12/03/20 12:17 PM   Specimen: BLOOD RIGHT HAND  Result Value Ref Range Status   Specimen Description BLOOD RIGHT HAND   Final   Special Requests   Final    BOTTLES DRAWN AEROBIC AND ANAEROBIC Blood Culture adequate volume   Culture  Setup Time   Final    GRAM POSITIVE COCCI IN BOTH AEROBIC AND ANAEROBIC BOTTLES Gram Stain Report Called to,Read Back By and Verified With: DAVIS A. @ [id number] ON [id number] BY HENDERSON L. CRITICAL RESULT CALLED TO, READ BACK BY AND VERIFIED WITH: L CURRAN PHARMD @1943  12/04/20 EB    Culture (A)  Final    STAPHYLOCOCCUS HOMINIS STAPHYLOCOCCUS CAPITIS STAPHYLOCOCCUS EPIDERMIDIS THE SIGNIFICANCE OF ISOLATING THIS ORGANISM FROM A SINGLE SET OF BLOOD CULTURES WHEN MULTIPLE SETS ARE DRAWN IS UNCERTAIN. PLEASE NOTIFY THE MICROBIOLOGY DEPARTMENT WITHIN ONE WEEK IF SPECIATION AND SENSITIVITIES ARE REQUIRED. Performed at [facility], [place] [address], [place],  [address]    Report Status 12/07/2020 FINAL  Final  Blood Culture ID Panel (Reflexed)     Status: Abnormal   Collection Time: 12/03/20 12:17 PM  Result Value Ref Range Status   Enterococcus faecalis NOT DETECTED NOT DETECTED Final   Enterococcus Faecium NOT DETECTED NOT DETECTED Final   Listeria monocytogenes NOT DETECTED NOT DETECTED Final   Staphylococcus species DETECTED (A) NOT DETECTED Final    Comment: CRITICAL RESULT CALLED TO, READ BACK BY AND VERIFIED WITH: L CURRAN PHARMD @1943  12/04/20 EB    Staphylococcus aureus (BCID) NOT DETECTED NOT DETECTED Final   Staphylococcus epidermidis DETECTED (A) NOT DETECTED Final  Comment: Methicillin (oxacillin) resistant coagulase negative staphylococcus. Possible blood culture contaminant (unless isolated from more than one blood culture draw or clinical case suggests pathogenicity). No antibiotic treatment is indicated for blood  culture contaminants. CRITICAL RESULT CALLED TO, READ BACK BY AND VERIFIED WITH: L CURRAN PHARMD @1943  12/04/20 EB    Staphylococcus lugdunensis NOT DETECTED NOT DETECTED Final   Streptococcus species NOT DETECTED NOT DETECTED Final    Streptococcus agalactiae NOT DETECTED NOT DETECTED Final   Streptococcus pneumoniae NOT DETECTED NOT DETECTED Final   Streptococcus pyogenes NOT DETECTED NOT DETECTED Final   A.calcoaceticus-baumannii NOT DETECTED NOT DETECTED Final   Bacteroides fragilis NOT DETECTED NOT DETECTED Final   Enterobacterales NOT DETECTED NOT DETECTED Final   Enterobacter cloacae complex NOT DETECTED NOT DETECTED Final   Escherichia coli NOT DETECTED NOT DETECTED Final   Klebsiella aerogenes NOT DETECTED NOT DETECTED Final   Klebsiella oxytoca NOT DETECTED NOT DETECTED Final   Klebsiella pneumoniae NOT DETECTED NOT DETECTED Final   Proteus species NOT DETECTED NOT DETECTED Final   Salmonella species NOT DETECTED NOT DETECTED Final   Serratia marcescens NOT DETECTED NOT DETECTED Final   Haemophilus influenzae NOT DETECTED NOT DETECTED Final   Neisseria meningitidis NOT DETECTED NOT DETECTED Final   Pseudomonas aeruginosa NOT DETECTED NOT DETECTED Final   Stenotrophomonas maltophilia NOT DETECTED NOT DETECTED Final   Candida albicans NOT DETECTED NOT DETECTED Final   Candida auris NOT DETECTED NOT DETECTED Final   Candida glabrata NOT DETECTED NOT DETECTED Final   Candida krusei NOT DETECTED NOT DETECTED Final   Candida parapsilosis NOT DETECTED NOT DETECTED Final   Candida tropicalis NOT DETECTED NOT DETECTED Final   Cryptococcus neoformans/gattii NOT DETECTED NOT DETECTED Final   Methicillin resistance mecA/C DETECTED (A) NOT DETECTED Final    Comment: CRITICAL RESULT CALLED TO, READ BACK BY AND VERIFIED WITH: L CURRAN PHARMD @1943  12/04/20 EB Performed at [facility], [address], [place], [place] [address]       Studies: No results found.  Scheduled Meds:  amLODipine  5 mg Oral Daily   atorvastatin  20 mg Oral QPM   brimonidine  1 drop Both Eyes BID   dorzolamide-timolol  1 drop Both Eyes BID   gabapentin  600 mg Oral BID   insulin aspart  0-5 Units Subcutaneous QHS   insulin aspart   0-9 Units Subcutaneous TID WC   latanoprost  1 drop Both Eyes QHS   levothyroxine  88 mcg Oral QHS   memantine  10 mg Oral BID   metoprolol tartrate  25 mg Oral BID   multivitamin with minerals  1 tablet Oral Daily   Netarsudil Dimesylate  1 drop Both Eyes QHS   pantoprazole  40 mg Oral BID AC   tamsulosin  0.4 mg Oral BID    Continuous Infusions:  ceFEPime (MAXIPIME) IV 2 g (12/07/20 0928)     LOS: 12 days     Cristal Deer, MD [facility]  To reach me or the doctor on call, go to: [email or web address] Password [facility]  12/07/2020, 6:54 PM

## 2020-12-08 LAB — BASIC METABOLIC PANEL
Anion gap: 8 (ref 5–15)
BUN: 20 mg/dL (ref 8–23)
CO2: 23 mmol/L (ref 22–32)
Calcium: 8.7 mg/dL — ABNORMAL LOW (ref 8.9–10.3)
Chloride: 104 mmol/L (ref 98–111)
Creatinine, Ser: 1.39 mg/dL — ABNORMAL HIGH (ref 0.61–1.24)
GFR, Estimated: 51 mL/min — ABNORMAL LOW (ref >60)
Glucose, Bld: 145 mg/dL — ABNORMAL HIGH (ref 70–99)
Potassium: 4 mmol/L (ref 3.5–5.1)
Sodium: 135 mmol/L (ref 135–145)

## 2020-12-08 LAB — CBC WITH DIFFERENTIAL/PLATELET
Abs Immature Granulocytes: 0.12 10*3/uL — ABNORMAL HIGH (ref 0.00–0.07)
Basophils Absolute: 0 10*3/uL (ref 0.0–0.1)
Basophils Relative: 0 %
Eosinophils Absolute: 0.1 10*3/uL (ref 0.0–0.5)
Eosinophils Relative: 1 %
HCT: 27.3 % — ABNORMAL LOW (ref 39.0–52.0)
Hemoglobin: 8.9 g/dL — ABNORMAL LOW (ref 13.0–17.0)
Immature Granulocytes: 1 %
Lymphocytes Relative: 10 %
Lymphs Abs: 1.4 10*3/uL (ref 0.7–4.0)
MCH: 30.7 pg (ref 26.0–34.0)
MCHC: 32.6 g/dL (ref 30.0–36.0)
MCV: 94.1 fL (ref 80.0–100.0)
Monocytes Absolute: 1 10*3/uL (ref 0.1–1.0)
Monocytes Relative: 7 %
Neutro Abs: 11.4 10*3/uL — ABNORMAL HIGH (ref 1.7–7.7)
Neutrophils Relative %: 81 %
Platelets: 416 10*3/uL — ABNORMAL HIGH (ref 150–400)
RBC: 2.9 MIL/uL — ABNORMAL LOW (ref 4.22–5.81)
RDW: 14 % (ref 11.5–15.5)
WBC: 13.9 10*3/uL — ABNORMAL HIGH (ref 4.0–10.5)
nRBC: 0 % (ref 0.0–0.2)

## 2020-12-08 LAB — GLUCOSE, CAPILLARY
Glucose-Capillary: 171 mg/dL — ABNORMAL HIGH (ref 70–99)
Glucose-Capillary: 168 mg/dL — ABNORMAL HIGH (ref 70–99)
Glucose-Capillary: 256 mg/dL — ABNORMAL HIGH (ref 70–99)
Glucose-Capillary: 267 mg/dL — ABNORMAL HIGH (ref 70–99)

## 2020-12-08 LAB — CULTURE, BLOOD (ROUTINE X 2)
Culture: NO GROWTH
Special Requests: ADEQUATE

## 2020-12-08 MED ORDER — SODIUM CHLORIDE 0.9 % IV SOLN
2.0000 g | Freq: Two times a day (BID) | INTRAVENOUS | Status: AC
  Administered 2020-12-08 (×2): 2 g via INTRAVENOUS
  Filled 2020-12-08 (×2): qty 2

## 2020-12-08 MED ORDER — ENOXAPARIN SODIUM 40 MG/0.4ML IJ SOSY
40.0000 mg | PREFILLED_SYRINGE | INTRAMUSCULAR | Status: DC
  Administered 2020-12-08: 40 mg via SUBCUTANEOUS
  Filled 2020-12-08: qty 0.4

## 2020-12-08 NOTE — Progress Notes (Signed)
PROGRESS NOTE    Mike Mack  GMW:[id number] DOB: [birth year] DOA: 11/24/2020 PCP: Jani Gravel, MD   Chief Complain: Generalized weakness  Brief Narrative:  Patient is 85 year-old male with history of diabetes type 2, hypertension, BPH, CKD  IIIa, CVA, cognitive decline, childhood polio with right lower extremity atrophy/weakness, neuropathy who presented from home with generalized weakness.  On presentation he was found to have AKI.  Patient was admitted and was started on IV antibiotics.  On 6/13, he had a bowel movement that was black, patient found to be anemic.  GI consulted and he underwent EGD with finding of 4 ulcers in the stomach/duodenum.  Patient also spiked fever during this hospitalization.  Found to have E. coli and Enterobacter UTI, started on antibiotics.  Overall condition has improved.  PT/OT recommended skilled nursing facility on discharge.  He is medically stable for discharge to skilled facility discharge bed is available.   Assessment & Plan:   Principal Problem:   AKI (acute kidney injury) ([place]) Active Problems:   Hypertension   BPH (benign prostatic hyperplasia)   Generalized weakness   S/P cholecystectomy   Monoparesis of leg (HCC)   Controlled type 2 diabetes mellitus with stable proliferative retinopathy of both eyes, with long-term current use of insulin (HCC)   Dementia (HCC)   Polio osteopathy of lower leg, left ([place])   AKI on CKD stage IIIa: Present on admission.  Resolved with IV fluids.  Currently kidney function at baseline.  Acute blood loss anemia: Became anemic during this hospitalization.  GI consulted.  EGD showed gastric/duodenal ulcers.  Continue PPI twice daily. He received a unit of blood transfusion during this hospitalization.  Currently hemoglobin stable in the range of 8.  Fever/urinary tract infection: Patient became febrile during this hospitalization.  Urine culture showed Enterobacter and E. coli.  He has finished antibiotics  course.  Blood cultures have been mostly negative.  1 blood culture set sent on 12/03/20 showed multiple stable focus species most likely contamination.  Currently afebrile, hemodynamically stable.he still has mild leucocytosis  BPH: On Flomax  Insulin-dependent diabetes type 2: Recent hemoglobin A1c of 6.5.  Continue current insulin regimen.  Resume home regimen on discharge.  Hyperlipidemia: On Lipitor  History of CVA/hyperlipidemia: Has chronic right hemiparesis.  Continue Lipitor.  Patient also has residual right lower extremity deformity secondary to childhood polio  Hypothyroidism: Continue Synthyroid  Dementia: Continue memantine.  Continue supportive care, delirium precautions  Hypertension: Currently blood stable.  Continue current medications  Debility/deconditioning: PT/OT evaluated the patient and recommended skilled services on discharge.  Medically stable for discharge.  TOC notified         DVT prophylaxis:Lovenox Code Status: Full Family Communication: None at bedside Status is: Inpatient  Remains inpatient appropriate because:Unsafe d/c plan  Dispo: The patient is from: Home              Anticipated d/c is to: SNF              Patient currently is medically stable to d/c.   Difficult to place patient No     Consultants: GI  Procedures:EGD  Antimicrobials:  Anti-infectives (From admission, onward)    Start     Dose/Rate Route Frequency Ordered Stop   12/08/20 1030  ceFEPIme (MAXIPIME) 2 g in sodium chloride 0.9 % 100 mL IVPB        2 g 200 mL/hr over 30 Minutes Intravenous Every 12 hours 12/08/20 0932 12/09/20 0959   12/04/20 2200  ceFEPIme (MAXIPIME) 2 g in sodium chloride 0.9 % 100 mL IVPB  Status:  Discontinued        2 g 200 mL/hr over 30 Minutes Intravenous Every 12 hours 12/04/20 2008 12/08/20 0931   12/01/20 2000  cefTRIAXone (ROCEPHIN) 1 g in sodium chloride 0.9 % 100 mL IVPB  Status:  Discontinued        1 g 200 mL/hr over 30 Minutes  Intravenous Every 24 hours 12/01/20 1849 12/04/20 2008       Subjective:  Patient seen and examined the bedside this morning.  He was sitting in the chair.  Comfortable.  Denies any complaints.  He was awake but confused.  He knows that he is in [place]  Objective: Vitals:   12/07/20 2344 12/08/20 0305 12/08/20 0803 12/08/20 1119  BP: 133/66 127/60 (!) 125/54 138/66  Pulse: 76 68 72 77  Resp: 16 18 16 12   Temp: 99.4 F (37.4 C) 99.2 F (37.3 C) 98.7 F (37.1 C) 98.5 F (36.9 C)  TempSrc: Oral Oral Oral Oral  SpO2: 97% 96% 97% 96%  Weight:      Height:        Intake/Output Summary (Last 24 hours) at 12/08/2020 1216 Last data filed at 12/08/2020 0715 Gross per 24 hour  Intake 240 ml  Output 950 ml  Net -710 ml   Filed Weights   11/30/20 1130 12/05/20 0420 12/06/20 0317  Weight: 84.5 kg 84.5 kg 84.5 kg    Examination:  General exam: Appears calm and comfortable ,Not in distress, deconditioned, debilitated, obese Respiratory system: Bilateral equal air entry, normal vesicular breath sounds, no wheezes or crackles  Cardiovascular system: S1 & S2 heard, RRR. No JVD, murmurs, rubs, gallops or clicks. No pedal edema. Gastrointestinal system: Abdomen is distended, soft and nontender. No organomegaly or masses felt. Normal bowel sounds heard. Central nervous system: Awake but not alert or oriented  extremities: No edema, no clubbing ,no cyanosis, atrophy of the right lower extremity Skin: No rashes, lesions or ulcers,no icterus ,no pallor   Data Reviewed: I have personally reviewed following labs and imaging studies  CBC: Recent Labs  Lab 12/02/20 0026 12/04/20 0127 12/06/20 1209 12/08/20 0840  WBC 14.2* 11.7* 14.9* 13.9*  NEUTROABS  --  9.4*  --  11.4*  HGB 8.3* 8.4* 10.8* 8.9*  HCT 24.2* 25.4* 32.2* 27.3*  MCV 92.7 94.4 92.8 94.1  PLT 284 292 346 213*   Basic Metabolic Panel: Recent Labs  Lab 12/04/20 0127 12/06/20 1209 12/08/20 0840  NA 135 132* 135   K 4.2 5.6* 4.0  CL 104 100 104  CO2 24 22 23   GLUCOSE 186* 210* 145*  BUN 17 19 20   CREATININE 1.40* 1.33* 1.39*  CALCIUM 8.0* 8.7* 8.7*   GFR: Estimated Creatinine Clearance: 41 mL/min (A) (by C-G formula based on SCr of 1.39 mg/dL (H)). Liver Function Tests: No results for input(s): AST, ALT, ALKPHOS, BILITOT, PROT, ALBUMIN in the last 168 hours. No results for input(s): LIPASE, AMYLASE in the last 168 hours. No results for input(s): AMMONIA in the last 168 hours. Coagulation Profile: No results for input(s): INR, PROTIME in the last 168 hours. Cardiac Enzymes: No results for input(s): CKTOTAL, CKMB, CKMBINDEX, TROPONINI in the last 168 hours. BNP (last 3 results) No results for input(s): PROBNP in the last 8760 hours. HbA1C: No results for input(s): HGBA1C in the last 72 hours. CBG: Recent Labs  Lab 12/07/20 1134 12/07/20 1626 12/07/20 2121 12/08/20 0865 12/08/20 1118  GLUCAP 169* 185* 152* 171* 168*   Lipid Profile: No results for input(s): CHOL, HDL, LDLCALC, TRIG, CHOLHDL, LDLDIRECT in the last 72 hours. Thyroid Function Tests: No results for input(s): TSH, T4TOTAL, FREET4, T3FREE, THYROIDAB in the last 72 hours. Anemia Panel: No results for input(s): VITAMINB12, FOLATE, FERRITIN, TIBC, IRON, RETICCTPCT in the last 72 hours. Sepsis Labs: No results for input(s): PROCALCITON, LATICACIDVEN in the last 168 hours.  Recent Results (from the past 240 hour(s))  Culture, blood (routine x 2)     Status: None   Collection Time: 11/30/20  5:44 AM   Specimen: BLOOD  Result Value Ref Range Status   Specimen Description BLOOD RIGHT ARM  Final   Special Requests   Final    BOTTLES DRAWN AEROBIC AND ANAEROBIC Blood Culture adequate volume   Culture   Final    NO GROWTH 5 DAYS Performed at [facility], [address], [place], [place] [address]    Report Status 12/05/2020 FINAL  Final  Culture, blood (routine x 2)     Status: None   Collection Time: 11/30/20  5:44  AM   Specimen: BLOOD LEFT HAND  Result Value Ref Range Status   Specimen Description BLOOD LEFT HAND  Final   Special Requests   Final    BOTTLES DRAWN AEROBIC AND ANAEROBIC Blood Culture adequate volume   Culture   Final    NO GROWTH 5 DAYS Performed at [facility], [place] [address], [place], [place] [address]    Report Status 12/05/2020 FINAL  Final  Culture, blood (routine x 2)     Status: None   Collection Time: 12/01/20  4:09 AM   Specimen: BLOOD RIGHT FOREARM  Result Value Ref Range Status   Specimen Description BLOOD RIGHT FOREARM  Final   Special Requests   Final    BOTTLES DRAWN AEROBIC AND ANAEROBIC Blood Culture adequate volume   Culture   Final    NO GROWTH 5 DAYS Performed at [facility], [place] [address], [place], [place] [address]    Report Status 12/06/2020 FINAL  Final  Culture, blood (routine x 2)     Status: None   Collection Time: 12/01/20  4:14 AM   Specimen: BLOOD RIGHT HAND  Result Value Ref Range Status   Specimen Description BLOOD RIGHT HAND  Final   Special Requests   Final    BOTTLES DRAWN AEROBIC AND ANAEROBIC Blood Culture results may not be optimal due to an inadequate volume of blood received in culture bottles   Culture   Final    NO GROWTH 5 DAYS Performed at [facility], [place] [address], [place], [place] [address]    Report Status 12/06/2020 FINAL  Final  Resp Panel by RT-PCR (Flu A&B, Covid)     Status: None   Collection Time: 12/01/20  2:46 PM   Specimen: Nasopharyngeal(NP) swabs in vial transport medium  Result Value Ref Range Status   SARS Coronavirus 2 by RT PCR NEGATIVE NEGATIVE Final    Comment: (NOTE) SARS-CoV-2 target nucleic acids are NOT DETECTED.  The SARS-CoV-2 RNA is generally detectable in upper respiratory specimens during the acute phase of infection. The lowest concentration of SARS-CoV-2 viral copies this assay can detect is 138 copies/mL. A negative result does not preclude SARS-Cov-2 infection and  should not be used as the sole basis for treatment or other patient management decisions. A negative result may occur with  improper specimen collection/handling, submission  of specimen other than nasopharyngeal swab, presence of viral mutation(s) within the areas targeted by this assay, and inadequate number of viral copies(<138 copies/mL). A negative result must be combined with clinical observations, patient history, and epidemiological information. The expected result is Negative.  Fact Sheet for Patients:  EntrepreneurPulse.com.au  Fact Sheet for Healthcare Providers:  IncredibleEmployment.be  This test is no t yet approved or cleared by the [place] FDA and  has been authorized for detection and/or diagnosis of SARS-CoV-2 by FDA under an Emergency Use Authorization (EUA). This EUA will remain  in effect (meaning this test can be used) for the duration of the COVID-19 declaration under Section 564(b)(1) of the Act, 21 U.S.C.section 360bbb-3(b)(1), unless the authorization is terminated  or revoked sooner.       Influenza A by PCR NEGATIVE NEGATIVE Final   Influenza B by PCR NEGATIVE NEGATIVE Final    Comment: (NOTE) The Xpert Xpress SARS-CoV-2/FLU/RSV plus assay is intended as an aid in the diagnosis of influenza from Nasopharyngeal swab specimens and should not be used as a sole basis for treatment. Nasal washings and aspirates are unacceptable for Xpert Xpress SARS-CoV-2/FLU/RSV testing.  Fact Sheet for Patients: EntrepreneurPulse.com.au  Fact Sheet for Healthcare Providers: IncredibleEmployment.be  This test is not yet approved or cleared by the [place] FDA and has been authorized for detection and/or diagnosis of SARS-CoV-2 by FDA under an Emergency Use Authorization (EUA). This EUA will remain in effect (meaning this test can be used) for the duration of the COVID-19 declaration  under Section 564(b)(1) of the Act, 21 U.S.C. section 360bbb-3(b)(1), unless the authorization is terminated or revoked.  Performed at [facility], [place] [address] [place], [place] [address]   Urine Culture     Status: Abnormal   Collection Time: 12/01/20  5:00 PM   Specimen: Urine, Random  Result Value Ref Range Status   Specimen Description URINE, RANDOM  Final   Special Requests   Final    NONE Performed at [facility], [place] [address], [place], [place] [address]    Culture (A)  Final    >=100,000 COLONIES/mL ENTEROBACTER AEROGENES >=100,000 COLONIES/mL ESCHERICHIA COLI    Report Status 12/04/2020 FINAL  Final   Organism ID, Bacteria ENTEROBACTER AEROGENES (A)  Final   Organism ID, Bacteria ESCHERICHIA COLI (A)  Final      Susceptibility   Enterobacter aerogenes - MIC*    CEFAZOLIN >=64 RESISTANT Resistant     CEFEPIME <=0.12 SENSITIVE Sensitive     CEFTRIAXONE <=0.25 SENSITIVE Sensitive     CIPROFLOXACIN <=0.25 SENSITIVE Sensitive     GENTAMICIN <=1 SENSITIVE Sensitive     IMIPENEM 1 SENSITIVE Sensitive     NITROFURANTOIN 64 INTERMEDIATE Intermediate     TRIMETH/SULFA <=20 SENSITIVE Sensitive     PIP/TAZO <=4 SENSITIVE Sensitive     * >=100,000 COLONIES/mL ENTEROBACTER AEROGENES   Escherichia coli - MIC*    AMPICILLIN 4 SENSITIVE Sensitive     CEFAZOLIN <=4 SENSITIVE Sensitive     CEFEPIME <=0.12 SENSITIVE Sensitive     CEFTRIAXONE <=0.25 SENSITIVE Sensitive     CIPROFLOXACIN <=0.25 SENSITIVE Sensitive     GENTAMICIN <=1 SENSITIVE Sensitive     IMIPENEM <=0.25 SENSITIVE Sensitive     NITROFURANTOIN <=16 SENSITIVE Sensitive     TRIMETH/SULFA <=20 SENSITIVE Sensitive     AMPICILLIN/SULBACTAM <=2 SENSITIVE Sensitive     PIP/TAZO <=4 SENSITIVE Sensitive     * >=100,000 COLONIES/mL ESCHERICHIA COLI  Culture, Urine  Status: Abnormal   Collection Time: 12/03/20 11:23 AM   Specimen: Urine, Random  Result Value Ref Range Status   Specimen Description  URINE, RANDOM  Final   Special Requests NONE  Final   Culture (A)  Final    <10,000 COLONIES/mL INSIGNIFICANT GROWTH Performed at [facility], [address] [place], [place] [address]    Report Status 12/05/2020 FINAL  Final  Culture, blood (routine x 2)     Status: Abnormal   Collection Time: 12/03/20 12:17 PM   Specimen: BLOOD RIGHT HAND  Result Value Ref Range Status   Specimen Description BLOOD RIGHT HAND  Final   Special Requests   Final    BOTTLES DRAWN AEROBIC AND ANAEROBIC Blood Culture adequate volume   Culture  Setup Time   Final    GRAM POSITIVE COCCI IN BOTH AEROBIC AND ANAEROBIC BOTTLES Gram Stain Report Called to,Read Back By and Verified With: DAVIS A. @ [id number] ON [id number] BY HENDERSON L. CRITICAL RESULT CALLED TO, READ BACK BY AND VERIFIED WITH: L CURRAN PHARMD @1943  12/04/20 EB    Culture (A)  Final    STAPHYLOCOCCUS HOMINIS STAPHYLOCOCCUS CAPITIS STAPHYLOCOCCUS EPIDERMIDIS THE SIGNIFICANCE OF ISOLATING THIS ORGANISM FROM A SINGLE SET OF BLOOD CULTURES WHEN MULTIPLE SETS ARE DRAWN IS UNCERTAIN. PLEASE NOTIFY THE MICROBIOLOGY DEPARTMENT WITHIN ONE WEEK IF SPECIATION AND SENSITIVITIES ARE REQUIRED. Performed at [facility], [place] [address] [place], [place] [address]    Report Status 12/07/2020 FINAL  Final  Blood Culture ID Panel (Reflexed)     Status: Abnormal   Collection Time: 12/03/20 12:17 PM  Result Value Ref Range Status   Enterococcus faecalis NOT DETECTED NOT DETECTED Final   Enterococcus Faecium NOT DETECTED NOT DETECTED Final   Listeria monocytogenes NOT DETECTED NOT DETECTED Final   Staphylococcus species DETECTED (A) NOT DETECTED Final    Comment: CRITICAL RESULT CALLED TO, READ BACK BY AND VERIFIED WITH: L CURRAN PHARMD @1943  12/04/20 EB    Staphylococcus aureus (BCID) NOT DETECTED NOT DETECTED Final   Staphylococcus epidermidis DETECTED (A) NOT DETECTED Final    Comment: Methicillin (oxacillin) resistant coagulase negative  staphylococcus. Possible blood culture contaminant (unless isolated from more than one blood culture draw or clinical case suggests pathogenicity). No antibiotic treatment is indicated for blood  culture contaminants. CRITICAL RESULT CALLED TO, READ BACK BY AND VERIFIED WITH: L CURRAN PHARMD @1943  12/04/20 EB    Staphylococcus lugdunensis NOT DETECTED NOT DETECTED Final   Streptococcus species NOT DETECTED NOT DETECTED Final   Streptococcus agalactiae NOT DETECTED NOT DETECTED Final   Streptococcus pneumoniae NOT DETECTED NOT DETECTED Final   Streptococcus pyogenes NOT DETECTED NOT DETECTED Final   A.calcoaceticus-baumannii NOT DETECTED NOT DETECTED Final   Bacteroides fragilis NOT DETECTED NOT DETECTED Final   Enterobacterales NOT DETECTED NOT DETECTED Final   Enterobacter cloacae complex NOT DETECTED NOT DETECTED Final   Escherichia coli NOT DETECTED NOT DETECTED Final   Klebsiella aerogenes NOT DETECTED NOT DETECTED Final   Klebsiella oxytoca NOT DETECTED NOT DETECTED Final   Klebsiella pneumoniae NOT DETECTED NOT DETECTED Final   Proteus species NOT DETECTED NOT DETECTED Final   Salmonella species NOT DETECTED NOT DETECTED Final   Serratia marcescens NOT DETECTED NOT DETECTED Final   Haemophilus influenzae NOT DETECTED NOT DETECTED Final   Neisseria meningitidis NOT DETECTED NOT DETECTED Final   Pseudomonas aeruginosa NOT DETECTED NOT DETECTED Final   Stenotrophomonas maltophilia NOT DETECTED NOT DETECTED Final   Candida albicans NOT DETECTED  NOT DETECTED Final   Candida auris NOT DETECTED NOT DETECTED Final   Candida glabrata NOT DETECTED NOT DETECTED Final   Candida krusei NOT DETECTED NOT DETECTED Final   Candida parapsilosis NOT DETECTED NOT DETECTED Final   Candida tropicalis NOT DETECTED NOT DETECTED Final   Cryptococcus neoformans/gattii NOT DETECTED NOT DETECTED Final   Methicillin resistance mecA/C DETECTED (A) NOT DETECTED Final    Comment: CRITICAL RESULT CALLED TO,  READ BACK BY AND VERIFIED WITH: L CURRAN PHARMD @1943  12/04/20 EB Performed at [facility] [address] [place], [place] [address]   Culture, blood (routine x 2)     Status: None   Collection Time: 12/03/20 12:22 PM   Specimen: BLOOD LEFT HAND  Result Value Ref Range Status   Specimen Description BLOOD LEFT HAND  Final   Special Requests   Final    BOTTLES DRAWN AEROBIC AND ANAEROBIC Blood Culture adequate volume   Culture   Final    NO GROWTH 5 DAYS Performed at [facility], [place] [address] [place],  [address]    Report Status 12/08/2020 FINAL  Final         Radiology Studies: No results found.      Scheduled Meds:  amLODipine  5 mg Oral Daily   atorvastatin  20 mg Oral QPM   brimonidine  1 drop Both Eyes BID   dorzolamide-timolol  1 drop Both Eyes BID   gabapentin  600 mg Oral BID   insulin aspart  0-5 Units Subcutaneous QHS   insulin aspart  0-9 Units Subcutaneous TID WC   latanoprost  1 drop Both Eyes QHS   levothyroxine  88 mcg Oral QHS   memantine  10 mg Oral BID   metoprolol tartrate  25 mg Oral BID   multivitamin with minerals  1 tablet Oral Daily   Netarsudil Dimesylate  1 drop Both Eyes QHS   pantoprazole  40 mg Oral BID AC   tamsulosin  0.4 mg Oral BID   Continuous Infusions:  ceFEPime (MAXIPIME) IV 2 g (12/08/20 1039)     LOS: 13 days    Time spent:35 mins. More than 50% of that time was spent in counseling and/or coordination of care.      Shelly Coss, MD [facility] P6/22/2022, 12:16 PM

## 2020-12-08 NOTE — TOC Progression Note (Signed)
Transition of Care [facility]) - Progression Note    Patient Details  Name: Bentlee Benningfield MRN: [id number] Date of Birth: 1935/06/28  Transition of Care [facility]) CM/SW [place], [place] Phone Number: 12/08/2020, 10:18 AM  Clinical Narrative:     CSW spoke with [name] Admission Coordinator/[place]- requested to start insurance authorization for SNF  MD updated & requested covid test  Thurmond Butts, MSW, LCSW Clinical Social Worker    Expected Discharge Plan: [place] Barriers to Discharge: Continued Medical Work up  Expected Discharge Plan and Services Expected Discharge Plan: [place] arrangements for the past 2 months: Single Family Home Expected Discharge Date: 11/27/20                                     Social Determinants of Health (SDOH) Interventions    Readmission Risk Interventions No flowsheet data found.

## 2020-12-08 NOTE — Progress Notes (Signed)
Lovenox ordered for DVT prophylaxis. CrCl>30 ml/min  Lovenox 40mg  SQ qday  Onnie Boer, PharmD, [place], AAHIVP, CPP Infectious Disease Pharmacist 12/08/2020 12:32 PM

## 2020-12-08 NOTE — Progress Notes (Signed)
Pt is on D8 of total abx for his e.coli/enterobacter UTI. Ok to stop abx after doses today per Dr Tawanna Solo.  Onnie Boer, PharmD, BCIDP, AAHIVP, CPP Infectious Disease Pharmacist 12/08/2020 9:32 AM

## 2020-12-08 NOTE — Progress Notes (Signed)
Occupational Therapy Treatment Patient Details Name: Mike Mack MRN: [id number] DOB: [birth year] Today's Date: 12/08/2020    History of present illness Pt presented to ED 6/8 with weakness and inability to ambulate. Pt found to have AKI. PMH - polio with chronic weakness of RLE, DM, HTN, anemia, CVA (residual RLE weakness after CVA per spouse).   OT comments  Pt with slow progress with OT goals downgraded accordingly. Pt continues to require extensive +2 physical assist for all mobility and noted difficulty following one step commands today. Pt unable to successfully stand with RW and 2 person assist, so opted for scoot transfer to recliner requiring Total A x 2. Emphasized importance of OOB activity as unsure if pt has been out of bed recently. Also emphasized proper positioning of R LE to minimize pain and maintain proper joint alignment. Continue to recommend SNF as pt is below functional baseline.    Follow Up Recommendations  SNF;Supervision/Assistance - 24 hour    Equipment Recommendations  Wheelchair (measurements OT);Wheelchair cushion (measurements OT);Hospital bed    Recommendations for Other Services      Precautions / Restrictions Precautions Precautions: Fall Precaution Comments: chronic R LE dysfunction d/t childhood polio and CVA Required Braces or Orthoses: Knee Immobilizer - Right Knee Immobilizer - Right: Other (comment) (trial for R knee stability in standing, does not need to wear in bed or in chair) Restrictions Weight Bearing Restrictions: No       Mobility Bed Mobility Overal bed mobility: Needs Assistance Bed Mobility: Supine to Sit     Supine to sit: Total assist;+2 for physical assistance;+2 for safety/equipment;HOB elevated     General bed mobility comments: Total A x 2, minimal initiation    Transfers Overall transfer level: Needs assistance Equipment used: Rolling walker (2 wheeled) Transfers: Lateral/Scoot Transfers           Lateral/Scoot Transfers: Total assist;+2 physical assistance;+2 safety/equipment General transfer comment: Trialed sit to stand with RW and use of KI for R knee instability though pt unable to acheive upright posture with 2 person assist, R knee still with buckling. Guided pt in scoot transfer bed to chair with difficulty following commands, ultimately required Total A x 2 for scoot transfer.    Balance Overall balance assessment: Needs assistance Sitting-balance support: Feet supported;Bilateral upper extremity supported Sitting balance-Leahy Scale: Poor Sitting balance - Comments: reliant on UE support, initially Max A with posterior lean to close min guard for safety with UE support Postural control: Posterior lean Standing balance support: Bilateral upper extremity supported Standing balance-Leahy Scale: Zero Standing balance comment: pt unable this date                           ADL either performed or assessed with clinical judgement   ADL Overall ADL's : Needs assistance/impaired Eating/Feeding: Maximal assistance;Sitting Eating/Feeding Details (indicate cue type and reason): wife assisting with lunch on OT exit                 Lower Body Dressing: Total assistance;Bed level Lower Body Dressing Details (indicate cue type and reason): Total A to don socks, KI in bed               General ADL Comments: Increasing assist needed today with poor following of commands. Trialed KI for knee stability but unable to achieve full upright standing - poor follow-through with scoot transfer attempts. Emphasized proper positioning of RLE in bed/chair to minimize pain and prevent contractures  if pt wants LE to be functional in standing     Vision   Vision Assessment?: No apparent visual deficits   Perception     Praxis      Cognition Arousal/Alertness: Lethargic Behavior During Therapy: Flat affect Overall Cognitive Status: Impaired/Different from baseline Area of  Impairment: Memory;Following commands;Problem solving;Attention;Safety/judgement;Awareness                   Current Attention Level: Sustained Memory: Decreased short-term memory Following Commands: Follows one step commands inconsistently Safety/Judgement: Decreased awareness of deficits;Decreased awareness of safety Awareness: Intellectual Problem Solving: Decreased initiation;Difficulty sequencing;Requires verbal cues;Requires tactile cues;Slow processing General Comments: Baseline cognitive impairment, kept eyes closed for most of session. increased difficulty following commands despite repetition and multimodal cues.        Exercises     Shoulder Instructions       General Comments Wife present throughout session, supportive in attempting to get pt to engage. Emphasized proper R LE positioning to minimize pain and prevent contractures. No skylift pads in supply closet, so maximove pad placed under pt for staff safety in transfer back to bed    Pertinent Vitals/ Pain       Pain Assessment: Faces Faces Pain Scale: Hurts even more Pain Location: initially with touch/movement of R LE especially knee (which was in flexed position in bed) Pain Descriptors / Indicators: Grimacing;Guarding;Moaning;Aching Pain Intervention(s): Monitored during session;Limited activity within patient's tolerance;Repositioned  Home Living                                          Prior Functioning/Environment              Frequency  Min 2X/week        Progress Toward Goals  OT Goals(current goals can now be found in the care plan section)  Progress towards OT goals: Not progressing toward goals - comment;Goals drowngraded-see care plan  Acute Rehab OT Goals Patient Stated Goal: none stated this date OT Goal Formulation: With patient Time For Goal Achievement: 12/22/20 Potential to Achieve Goals: Fair ADL Goals Pt Will Perform Grooming: with set-up;sitting Pt  Will Perform Upper Body Bathing: with set-up;sitting Pt Will Perform Upper Body Dressing: with set-up;sitting Pt Will Transfer to Toilet: with min guard assist;stand pivot transfer;bedside commode Pt Will Perform Toileting - Clothing Manipulation and hygiene: with min assist;sit to/from stand  Plan Discharge plan remains appropriate    Co-evaluation                 AM-PAC OT "6 Clicks" Daily Activity     Outcome Measure   Help from another person eating meals?: A Lot Help from another person taking care of personal grooming?: A Lot Help from another person toileting, which includes using toliet, bedpan, or urinal?: Total Help from another person bathing (including washing, rinsing, drying)?: A Lot Help from another person to put on and taking off regular upper body clothing?: A Lot Help from another person to put on and taking off regular lower body clothing?: Total 6 Click Score: 10    End of Session Equipment Utilized During Treatment: Gait belt;Rolling walker  OT Visit Diagnosis: Muscle weakness (generalized) (M62.81);History of falling (Z91.81);Other symptoms and signs involving cognitive function   Activity Tolerance Patient limited by fatigue   Patient Left in chair;with call bell/phone within reach;with chair alarm set;with family/visitor present   Nurse Communication  Mobility status;Need for lift equipment        Time: 1124-1150 OT Time Calculation (min): 26 min  Charges: OT General Charges $OT Visit: 1 Visit OT Treatments $Therapeutic Activity: 23-37 mins  [name], OTR/L Acute Rehab Services Office: [phone]    [name] 12/08/2020, 12:33 PM

## 2020-12-09 DIAGNOSIS — F0390 Unspecified dementia without behavioral disturbance: Secondary | ICD-10-CM | POA: Diagnosis not present

## 2020-12-09 DIAGNOSIS — I7091 Generalized atherosclerosis: Secondary | ICD-10-CM | POA: Diagnosis not present

## 2020-12-09 DIAGNOSIS — I6789 Other cerebrovascular disease: Secondary | ICD-10-CM | POA: Diagnosis not present

## 2020-12-09 DIAGNOSIS — Z794 Long term (current) use of insulin: Secondary | ICD-10-CM | POA: Diagnosis not present

## 2020-12-09 DIAGNOSIS — G8191 Hemiplegia, unspecified affecting right dominant side: Secondary | ICD-10-CM | POA: Diagnosis not present

## 2020-12-09 DIAGNOSIS — K279 Peptic ulcer, site unspecified, unspecified as acute or chronic, without hemorrhage or perforation: Secondary | ICD-10-CM | POA: Diagnosis not present

## 2020-12-09 DIAGNOSIS — D649 Anemia, unspecified: Secondary | ICD-10-CM | POA: Diagnosis not present

## 2020-12-09 DIAGNOSIS — F0280 Dementia in other diseases classified elsewhere without behavioral disturbance: Secondary | ICD-10-CM | POA: Diagnosis not present

## 2020-12-09 DIAGNOSIS — I679 Cerebrovascular disease, unspecified: Secondary | ICD-10-CM | POA: Diagnosis not present

## 2020-12-09 DIAGNOSIS — E1165 Type 2 diabetes mellitus with hyperglycemia: Secondary | ICD-10-CM | POA: Diagnosis not present

## 2020-12-09 DIAGNOSIS — Z8612 Personal history of poliomyelitis: Secondary | ICD-10-CM | POA: Diagnosis not present

## 2020-12-09 DIAGNOSIS — R5381 Other malaise: Secondary | ICD-10-CM | POA: Diagnosis not present

## 2020-12-09 DIAGNOSIS — E038 Other specified hypothyroidism: Secondary | ICD-10-CM | POA: Diagnosis not present

## 2020-12-09 DIAGNOSIS — N400 Enlarged prostate without lower urinary tract symptoms: Secondary | ICD-10-CM | POA: Diagnosis not present

## 2020-12-09 DIAGNOSIS — Z7401 Bed confinement status: Secondary | ICD-10-CM | POA: Diagnosis not present

## 2020-12-09 DIAGNOSIS — B9620 Unspecified Escherichia coli [E. coli] as the cause of diseases classified elsewhere: Secondary | ICD-10-CM | POA: Diagnosis not present

## 2020-12-09 DIAGNOSIS — R531 Weakness: Secondary | ICD-10-CM | POA: Diagnosis not present

## 2020-12-09 DIAGNOSIS — E113553 Type 2 diabetes mellitus with stable proliferative diabetic retinopathy, bilateral: Secondary | ICD-10-CM | POA: Diagnosis not present

## 2020-12-09 DIAGNOSIS — R4182 Altered mental status, unspecified: Secondary | ICD-10-CM | POA: Diagnosis not present

## 2020-12-09 DIAGNOSIS — R1310 Dysphagia, unspecified: Secondary | ICD-10-CM | POA: Diagnosis not present

## 2020-12-09 DIAGNOSIS — N179 Acute kidney failure, unspecified: Secondary | ICD-10-CM | POA: Diagnosis not present

## 2020-12-09 DIAGNOSIS — G8314 Monoplegia of lower limb affecting left nondominant side: Secondary | ICD-10-CM | POA: Diagnosis not present

## 2020-12-09 LAB — TSH: TSH: 6.746 u[IU]/mL — ABNORMAL HIGH (ref 0.350–4.500)

## 2020-12-09 LAB — CBC WITH DIFFERENTIAL/PLATELET
Abs Immature Granulocytes: 0.14 10*3/uL — ABNORMAL HIGH (ref 0.00–0.07)
Basophils Absolute: 0 10*3/uL (ref 0.0–0.1)
Basophils Relative: 0 %
Eosinophils Absolute: 0.1 10*3/uL (ref 0.0–0.5)
Eosinophils Relative: 0 %
HCT: 25.6 % — ABNORMAL LOW (ref 39.0–52.0)
Hemoglobin: 8.4 g/dL — ABNORMAL LOW (ref 13.0–17.0)
Immature Granulocytes: 1 %
Lymphocytes Relative: 9 %
Lymphs Abs: 1.3 10*3/uL (ref 0.7–4.0)
MCH: 30.5 pg (ref 26.0–34.0)
MCHC: 32.8 g/dL (ref 30.0–36.0)
MCV: 93.1 fL (ref 80.0–100.0)
Monocytes Absolute: 0.9 10*3/uL (ref 0.1–1.0)
Monocytes Relative: 6 %
Neutro Abs: 11.5 10*3/uL — ABNORMAL HIGH (ref 1.7–7.7)
Neutrophils Relative %: 84 %
Platelets: 386 10*3/uL (ref 150–400)
RBC: 2.75 MIL/uL — ABNORMAL LOW (ref 4.22–5.81)
RDW: 14.1 % (ref 11.5–15.5)
WBC: 13.9 10*3/uL — ABNORMAL HIGH (ref 4.0–10.5)
nRBC: 0 % (ref 0.0–0.2)

## 2020-12-09 LAB — BASIC METABOLIC PANEL
Anion gap: 6 (ref 5–15)
BUN: 18 mg/dL (ref 8–23)
CO2: 25 mmol/L (ref 22–32)
Calcium: 8.6 mg/dL — ABNORMAL LOW (ref 8.9–10.3)
Chloride: 102 mmol/L (ref 98–111)
Creatinine, Ser: 1.39 mg/dL — ABNORMAL HIGH (ref 0.61–1.24)
GFR, Estimated: 51 mL/min — ABNORMAL LOW (ref >60)
Glucose, Bld: 277 mg/dL — ABNORMAL HIGH (ref 70–99)
Potassium: 4.2 mmol/L (ref 3.5–5.1)
Sodium: 133 mmol/L — ABNORMAL LOW (ref 135–145)

## 2020-12-09 LAB — GLUCOSE, CAPILLARY
Glucose-Capillary: 185 mg/dL — ABNORMAL HIGH (ref 70–99)
Glucose-Capillary: 273 mg/dL — ABNORMAL HIGH (ref 70–99)
Glucose-Capillary: 212 mg/dL — ABNORMAL HIGH (ref 70–99)
Glucose-Capillary: 206 mg/dL — ABNORMAL HIGH (ref 70–99)

## 2020-12-09 LAB — CBC
HCT: 27.3 % — ABNORMAL LOW (ref 39.0–52.0)
Hemoglobin: 8.9 g/dL — ABNORMAL LOW (ref 13.0–17.0)
MCH: 30.5 pg (ref 26.0–34.0)
MCHC: 32.6 g/dL (ref 30.0–36.0)
MCV: 93.5 fL (ref 80.0–100.0)
Platelets: 426 10*3/uL — ABNORMAL HIGH (ref 150–400)
RBC: 2.92 MIL/uL — ABNORMAL LOW (ref 4.22–5.81)
RDW: 14.1 % (ref 11.5–15.5)
WBC: 12.8 10*3/uL — ABNORMAL HIGH (ref 4.0–10.5)
nRBC: 0 % (ref 0.0–0.2)

## 2020-12-09 LAB — AMMONIA: Ammonia: 12 umol/L (ref 9–35)

## 2020-12-09 LAB — FOLATE: Folate: 26.5 ng/mL (ref >5.9)

## 2020-12-09 LAB — RESP PANEL BY RT-PCR (FLU A&B, COVID) ARPGX2
Influenza A by PCR: NEGATIVE
Influenza B by PCR: NEGATIVE
SARS Coronavirus 2 by RT PCR: NEGATIVE

## 2020-12-09 LAB — VITAMIN B12: Vitamin B-12: 634 pg/mL (ref 180–914)

## 2020-12-09 LAB — T4, FREE: Free T4: 1.33 ng/dL — ABNORMAL HIGH (ref 0.61–1.12)

## 2020-12-09 MED ORDER — ASPIRIN EC 81 MG PO TBEC: 81.0000 mg | DELAYED_RELEASE_TABLET | Freq: Every day | ORAL | 11 refills | Status: DC

## 2020-12-09 MED ORDER — METOPROLOL TARTRATE 25 MG PO TABS: 25.0000 mg | ORAL_TABLET | Freq: Two times a day (BID) | ORAL | 0 refills | Status: DC

## 2020-12-09 MED ORDER — PANTOPRAZOLE SODIUM 40 MG PO TBEC: 40.0000 mg | DELAYED_RELEASE_TABLET | Freq: Two times a day (BID) | ORAL | 0 refills | Status: DC

## 2020-12-09 NOTE — TOC Transition Note (Signed)
Transition of Care [facility]) - CM/SW Discharge Note   Patient Details  Name: Mike Mack MRN: [id number] Date of Birth: 1936/02/18  [facility]) CM/SW Contact:  Vinie Sill, LCSW Phone Number: 12/09/2020, 3:43 PM   Clinical Narrative:     Patient will Discharge to: [facility] Discharge Date: 12/09/2020 Family Notified: spouse Transport PZ:[facility]  Per MD patient is ready for discharge. RN, patient, and facility notified of discharge. Discharge Summary sent to facility. RN given number for [phone]. Ambulance transport requested for patient.   Clinical Social Worker signing off.  Thurmond Butts, MSW, LCSW Clinical Social Worker    Final next level of care: Skilled Nursing Facility Barriers to Discharge: Barriers Resolved   Patient Goals and CMS Choice Patient states their goals for this hospitalization and ongoing recovery are:: Pt unable to partiicpate in goal setting CMS Medicare.gov Compare Post Acute Care list provided to:: Patient Choice offered to / list presented to : Spouse  Discharge Placement              Patient chooses bed at: [facility] Patient to be transferred to facility by: [place] Name of family member notified: spouse Patient and family notified of of transfer: 12/09/20  Discharge Plan and Services                                     Social Determinants of Health (SDOH) Interventions     Readmission Risk Interventions No flowsheet data found.

## 2020-12-09 NOTE — TOC Progression Note (Signed)
Transition of Care [facility]) - Progression Note    Patient Details  Name: Mike Mack MRN: [id number] Date of Birth: 1936/01/29  Transition of Care [facility]) CM/SW [place], [place] Phone Number: 12/09/2020, 11:41 AM  Clinical Narrative:    10:30am- called [facility] was directed to submitted issues request using the portal- submitted request  11:41am- still no [facility] #   Thurmond Butts, MSW, LCSW Clinical Social Worker    Expected Discharge Plan: Skilled Nursing Facility Barriers to Discharge: Continued Medical Work up  Expected Discharge Plan and Services Expected Discharge Plan: [place] arrangements for the past 2 months: Single Family Home Expected Discharge Date: 11/27/20                                     Social Determinants of Health (SDOH) Interventions    Readmission Risk Interventions No flowsheet data found.

## 2020-12-09 NOTE — Discharge Summary (Signed)
[facility] Discharge Summary   Patient: Mike Mack NID:[id number]  PCP: Jani Gravel, MD  Date of admission: 11/24/2020   Date of discharge: 12/09/2020     Discharge Diagnoses:  Principal Problem:   AKI (acute kidney injury) ([place]) Active Problems:   Hypertension   BPH (benign prostatic hyperplasia)   Generalized weakness   S/P cholecystectomy   Monoparesis of leg ([place])   Controlled type 2 diabetes mellitus with stable proliferative retinopathy of both eyes, with long-term current use of insulin (HCC)   Dementia ([place])   Polio osteopathy of lower leg, left ([place])   Admitted From: home Disposition:  SNF   Recommendations for Outpatient Follow-up:  PCP: follow up in 1 week Repeat EGD in 3 months.  Follow up LABS/TEST:  none New Meds:  acetaminophen (TYLENOL)  feeding supplement (GLUCERNA SHAKE)  multivitamin with minerals  ondansetron (Zofran ODT)  pantoprazole (PROTONIX)   Changed meds:  aspirin EC  metoprolol tartrate (LOPRESSOR)   Stopped meds:  amLODipine 5 MG tablet (NORVASC)  Fiasp FlexTouch 100 UNIT/ML FlexTouch Pen (insulin aspart)  gabapentin 600 MG tablet (NEURONTIN)  glucose blood test strip  olmesartan 20 MG tablet (BENICAR)  pioglitazone 30 MG tablet (ACTOS)  Tyler Aas FlexTouch 100 UNIT/ML FlexTouch Pen (insulin degludec)    Follow-up Information     Jani Gravel, MD. Schedule an appointment as soon as possible for a visit in 1 week(s).   Specialty: Internal Medicine Why: repeat CBC and BMP in 1 week Contact information: [place] [address] [phone]         Gastroenterology, [facility]. Schedule an appointment as soon as possible for a visit in 3 month(s).   Why: repeat EGD Contact information: [place] [address] [phone]                Discharge Instructions     Diet - low sodium heart healthy   Complete by: As directed    Increase activity slowly   Complete by: As  directed    Increase activity slowly   Complete by: As directed       Diet recommendation: Carb modified diet  Activity: The patient is advised to gradually reintroduce usual activities, as tolerated  Discharge Condition: stable  Code Status: Full code   History of present illness: As per the H and P dictated on admission, " Mike Mack is a 85 y.o. male with medical history significant for DMT2, HTN, BPH, anemia, hx of polio causing atrophy and weakness of right leg. Presents for evaluation of generalized weakness. He normally ambulates with aide of walker.  He has not been able to push himself up in bed or stand up and walk even with his walker due to generalized weakness for the last 2 days.  He has not had any falls and no loss of consciousness.  He denies any headache, chest pain, back pain, abdominal pain, nausea vomiting diarrhea.  He has not had any cough, shortness of breath or fever.  Lives at home with his wife.  Had any confusion according to the wife.  In the slurred speech or drooping face.   ED Course:   Mike Mack has been hemodynamically stable in the emergency room.  Labs reveal acute kidney injury with a creatinine of 2.55.  His baseline creatinine is 1.1-1.4.  Glucose is 247.  Electrolytes are normal.  CBC shows an anemia with a hemoglobin of 9.1 and hematocrit 29.0 which is similar to  his baseline WBCs are normal.  MRI shows no acute stroke.  He does have remote strokes that are unchanged.  Radiologist states that the ventricles are slightly more prominent could represent a degree of NPH.  Hospitalist service asked to admit for further management"  Hospital Course:  Summary of his active problems in the hospital is as following.  On presentation he was found to have AKI.  Patient was admitted and was started on IV antibiotics.  On 6/13, he had a bowel movement that was black, patient found to be anemic.  GI consulted and he underwent EGD with finding of 4 ulcers in the  stomach/duodenum.  Patient also spiked fever during this hospitalization.  Found to have E. coli and Enterobacter UTI, started on antibiotics.  Overall condition has improved.    AKI on CKD stage IIIa:  Present on admission.  Baseline creat 1.1-1.4 On admission 2.55  Resolved with IV fluids. Currently kidney function at baseline.   Acute blood loss anemia:  Melena  Upper GI bleed due to PUD Became anemic during this hospitalization.   GI consulted.  EGD showed nonbleeding gastric/duodenal ulcers. Continue PPI twice daily. He received a unit of blood transfusion during this hospitalization.  Currently hemoglobin stable in the range of 8.   Fever/urinary tract infection:  Patient became febrile during this hospitalization.  Urine culture showed Enterobacter and E. coli.   He has finished antibiotics course.   Blood cultures have been negative.  1 blood culture set sent on 12/03/20 showed multiple staph species most likely contamination.   Currently afebrile, hemodynamically stable. he still has mild leucocytosis  Acute metabolic encephalopathy  Delirium due with dementia no agitation. Patient with prior history of dementia.  On Aricept. Presents with acute kidney injury and confusion MRI brain on admission was negative for any acute infarct but shows chronic microvascular ischemia as well as remote infarcts. At the time of my evaluation no focal deficit but remains confused. Asterixis present. Likely in the setting of gabapentin and cefepime use as well as AKI. Anticipating improvement in mentation gradually. TSH free T4 elevated likely secondary to Down syndrome.  Ammonia level normal.  Z61 normal.  Folic acid normal. Discontinue gabapentin.  Completed cefepime.  Stable for discharge with PT.  BPH:  On Flomax   Type 2 diabetes mellitus uncontrolled with hyperglycemia and hypoglycemia with long-term insulin use with renal dysfunction Recent hemoglobin A1c of 6.5.  On sliding  scale insulin but given that the patient has not required any insulin here in the hospital will discontinue home insulin regimen on discharge.   Hyperlipidemia:  On Lipitor   History of CVA/hyperlipidemia:  Has chronic right hemiparesis.   Continue Lipitor.  Patient also has residual right lower extremity deformity secondary to childhood polio Resume aspirin 1 week after on 6/30.  Likely will need indefinite PPI given need for aspirin for CVA.   Hypothyroidism:  Continue Synthyroid   Dementia:  Continue memantine.  Continue supportive care, delirium precautions   Hypertension:  Currently blood stable.  Continue current medications   Debility/deconditioning:  PT/OT evaluated the patient and recommended skilled services on discharge.  Medically stable for discharge.  TOC notified  Obesity:  Placing the pt at high risk for poor outcome.  Body mass index is 30.67 kg/m.   Patient was seen by physical therapy, who recommended SNF. On the day of the discharge the patient's vitals were stable, and no other new acute medical condition were reported. The patient was felt  safe to be discharge at SNF with Therapy.  Consultants: Gastroenterology  Procedures: EGD  DISCHARGE MEDICATION: Allergies as of 12/09/2020       Reactions   Metformin And Related Other (See Comments)   Gi intolerance   Other    Other reaction(s): Other (See Comments) Renal insufficiency Other reaction(s): GI Upset (intolerance) Gi intolerance   Soliqua [insulin Glargine-lixisenatide] Other (See Comments)   Stomach cramps        Medication List     STOP taking these medications    amLODipine 5 MG tablet Commonly known as: NORVASC   Fiasp FlexTouch 100 UNIT/ML FlexTouch Pen Generic drug: insulin aspart   gabapentin 600 MG tablet Commonly known as: NEURONTIN   glucose blood test strip   olmesartan 20 MG tablet Commonly known as: BENICAR   pioglitazone 30 MG tablet Commonly known as: ACTOS    Tresiba FlexTouch 100 UNIT/ML FlexTouch Pen Generic drug: insulin degludec       TAKE these medications    acetaminophen 325 MG tablet Commonly known as: TYLENOL Take 2 tablets (650 mg total) by mouth every 6 (six) hours as needed for mild pain (or Fever >/= 101).   aspirin EC 81 MG tablet Take 1 tablet (81 mg total) by mouth daily. Start taking on: December 16, 2020 What changed: These instructions start on December 16, 2020. If you are unsure what to do until then, ask your doctor or other care provider.   atorvastatin 20 MG tablet Commonly known as: LIPITOR Take 20 mg by mouth every evening.   B COMPLEX PO Take 1 tablet by mouth at bedtime.   brimonidine 0.2 % ophthalmic solution Commonly known as: ALPHAGAN Place 1 drop into both eyes 2 (two) times daily.   cholecalciferol 10 MCG (400 UNIT) Tabs tablet Commonly known as: VITAMIN D3 Take 400 Units by mouth daily.   dorzolamide-timolol 22.3-6.8 MG/ML ophthalmic solution Commonly known as: COSOPT Place 1 drop into both eyes 2 (two) times daily.   feeding supplement (GLUCERNA SHAKE) Liqd Take 237 mLs by mouth 3 (three) times daily between meals.   latanoprost 0.005 % ophthalmic solution Commonly known as: XALATAN Place 1 drop into both eyes at bedtime.   levothyroxine 88 MCG tablet Commonly known as: SYNTHROID Take 88 mcg by mouth at bedtime.   memantine 10 MG tablet Commonly known as: NAMENDA Take 10 mg by mouth 2 (two) times daily.   metoprolol tartrate 25 MG tablet Commonly known as: LOPRESSOR Take 1 tablet (25 mg total) by mouth 2 (two) times daily. What changed: Another medication with the same name was removed. Continue taking this medication, and follow the directions you see here.   multivitamin with minerals Tabs tablet Take 1 tablet by mouth daily.   ondansetron 4 MG disintegrating tablet Commonly known as: Zofran ODT 4mg  ODT q4 hours prn nausea/vomit   pantoprazole 40 MG tablet Commonly known as:  PROTONIX Take 1 tablet (40 mg total) by mouth 2 (two) times daily before a meal.   polyethylene glycol 17 g packet Commonly known as: MIRALAX / GLYCOLAX Take 17 g by mouth daily.   Rhopressa 0.02 % Soln Generic drug: Netarsudil Dimesylate Place 1 drop into both eyes at bedtime.   tamsulosin 0.4 MG Caps capsule Commonly known as: FLOMAX Take 0.4 mg by mouth in the morning and at bedtime.        Discharge Exam: Filed Weights   12/05/20 0420 12/06/20 0317 12/09/20 0300  Weight: 84.5 kg 84.5 kg 83.6 kg  Vitals:   12/09/20 0824 12/09/20 1214  BP: 137/70 122/62  Pulse: 83 84  Resp: 17 17  Temp: 98.5 F (36.9 C) 99.1 F (37.3 C)  SpO2: 96% 98%   General: Appear in no distress, no Rash; Oral Mucosa Clear, moist. no Abnormal Neck Mass Or lumps, Conjunctiva normal  Cardiovascular: S1 and S2 Present, no Murmur Respiratory: good respiratory effort, Bilateral Air entry present and CTA, no Crackles, no wheezes Abdomen: Bowel Sound present, Soft and no tenderness Extremities: trace Pedal edema Neurology: alert and oriented to time, place, and person affect flat in affect. no new focal deficit Asterixis present.  Prefers to keep eyes close but on opening PERRL  The results of significant diagnostics from this hospitalization (including imaging, microbiology, ancillary and laboratory) are listed below for reference.    Significant Diagnostic Studies: MR BRAIN WO CONTRAST  Result Date: 11/25/2020 CLINICAL DATA:  Initial evaluation for neuro deficit, stroke suspected. EXAM: MRI HEAD WITHOUT CONTRAST TECHNIQUE: Multiplanar, multiecho pulse sequences of the brain and surrounding structures were obtained without intravenous contrast. COMPARISON:  Prior CT from 03/19/2020. FINDINGS: Brain: Generalized age-related cerebral atrophy. Patchy and confluent T2/FLAIR hyperintensity seen involving the periventricular, deep, and subcortical white matter both cerebral hemispheres as well as the  midbrain and pons, most like related chronic microvascular ischemic disease, moderate in nature. Remote lacunar infarct present at the left thalamus. Multiple scattered remote bilateral cerebellar infarcts noted. No abnormal foci of restricted diffusion to suggest acute or subacute ischemia. Gray-white matter differentiation maintained. No other areas of encephalomalacia to suggest chronic cortical infarction. No foci of susceptibility artifact to suggest acute or chronic intracranial hemorrhage. No mass lesion, midline shift or mass effect. Ventricular prominence somewhat out of proportion to cortical sulcation, which could reflect a degree of NPH. No extra-axial fluid collection. Pituitary gland within normal limits. Midline structures intact. Vascular: Major intracranial vascular flow voids are maintained. Skull and upper cervical spine: Degenerative changes noted about the C1-2 articulation with thickening of the tectorial membrane. Craniocervical junction otherwise unremarkable. Trace degenerative anterolisthesis of C4 on C5 noted. Diffusely decreased T1 signal intensity seen within the visualized bone marrow, nonspecific, but most commonly related to anemia, smoking, or obesity. No focal marrow replacing lesion. No scalp soft tissue abnormality. Sinuses/Orbits: Patient status post bilateral ocular lens replacement. Globes and orbital soft tissues demonstrate no acute finding. Paranasal sinuses are largely clear. Moderate bilateral mastoid effusions, right worse than left. Visualized nasopharynx within normal limits. Other: None. IMPRESSION: 1. No acute intracranial abnormality. 2. Ventricular prominence somewhat out of proportion to cortical sulcation, which could reflect a degree of NPH. 3. Multiple remote bilateral cerebellar infarcts, with additional remote lacunar infarct involving the left thalamus. 4. Moderate chronic microvascular ischemic disease. 5. Moderate bilateral mastoid effusions, right worse  than left. Electronically Signed   By: Jeannine Boga M.D.   On: 11/25/2020 00:36   US RENAL  Result Date: 12/03/2020 CLINICAL DATA:  Elevated creatinine EXAM: RENAL / URINARY TRACT ULTRASOUND COMPLETE COMPARISON:  None. FINDINGS: Right Kidney: Renal measurements: 9.5 x 4.6 x 4.3 cm = volume: 99 mL. Echogenicity within normal limits. Benign simple cysts. No mass or hydronephrosis visualized. Left Kidney: Renal measurements: 11.8 x 4.5 x 3.6 cm = volume: 100 mL. Echogenicity within normal limits. Benign simple cysts. No mass or hydronephrosis visualized. Bladder: Appears normal for degree of bladder distention. Other: Prostatomegaly. IMPRESSION: 1. No acute abnormality. No hydronephrosis. 2. Prostatomegaly. Electronically Signed   By: Eddie Candle M.D.   On: 12/03/2020  12:29   DG CHEST PORT 1 VIEW  Result Date: 12/03/2020 CLINICAL DATA:  Fever and hypertension EXAM: PORTABLE CHEST 1 VIEW COMPARISON:  November 30, 2020 FINDINGS: There is mild left base atelectasis with small left pleural effusion. The lungs elsewhere are clear. Heart is upper normal in size with pulmonary vascularity normal. No adenopathy. No bone lesions. IMPRESSION: Left base atelectasis with small left pleural effusion. Lungs elsewhere clear. Heart upper normal in size. Electronically Signed   By: Lowella Grip III M.D.   On: 12/03/2020 12:48   DG CHEST PORT 1 VIEW  Result Date: 11/30/2020 CLINICAL DATA:  85 year old male with fever. EXAM: PORTABLE CHEST 1 VIEW COMPARISON:  Portable chest 03/19/2020 and earlier. CT Abdomen and Pelvis 04/06/2020 and earlier. FINDINGS: Portable AP semi upright view at 0554 hours. Low lung volumes with bibasilar pleural effusions, not significantly changed since October. Visible mediastinal contours remain normal. Visualized tracheal air column is within normal limits. No pneumothorax or pulmonary edema. No air bronchograms. Paucity of bowel gas in the upper abdomen. IMPRESSION: Low lung volumes and  small bilateral pleural effusions, not significantly changed since October. Electronically Signed   By: Genevie Ann M.D.   On: 11/30/2020 06:15    Microbiology: Recent Results (from the past 240 hour(s))  Culture, blood (routine x 2)     Status: None   Collection Time: 11/30/20  5:44 AM   Specimen: BLOOD  Result Value Ref Range Status   Specimen Description BLOOD RIGHT ARM  Final   Special Requests   Final    BOTTLES DRAWN AEROBIC AND ANAEROBIC Blood Culture adequate volume   Culture   Final    NO GROWTH 5 DAYS Performed at [facility], [address], [place], [place] [address]    Report Status 12/05/2020 FINAL  Final  Culture, blood (routine x 2)     Status: None   Collection Time: 11/30/20  5:44 AM   Specimen: BLOOD LEFT HAND  Result Value Ref Range Status   Specimen Description BLOOD LEFT HAND  Final   Special Requests   Final    BOTTLES DRAWN AEROBIC AND ANAEROBIC Blood Culture adequate volume   Culture   Final    NO GROWTH 5 DAYS Performed at [facility], [place] [address], [place], [place] [address]    Report Status 12/05/2020 FINAL  Final  Culture, blood (routine x 2)     Status: None   Collection Time: 12/01/20  4:09 AM   Specimen: BLOOD RIGHT FOREARM  Result Value Ref Range Status   Specimen Description BLOOD RIGHT FOREARM  Final   Special Requests   Final    BOTTLES DRAWN AEROBIC AND ANAEROBIC Blood Culture adequate volume   Culture   Final    NO GROWTH 5 DAYS Performed at [facility], [place] [address], [place], [place] [address]    Report Status 12/06/2020 FINAL  Final  Culture, blood (routine x 2)     Status: None   Collection Time: 12/01/20  4:14 AM   Specimen: BLOOD RIGHT HAND  Result Value Ref Range Status   Specimen Description BLOOD RIGHT HAND  Final   Special Requests   Final    BOTTLES DRAWN AEROBIC AND ANAEROBIC Blood Culture results may not be optimal due to an inadequate volume of blood received in culture bottles   Culture   Final     NO GROWTH 5 DAYS Performed at [facility], [place] [address],  [place], [place] [address]    Report Status 12/06/2020 FINAL  Final  Resp Panel by RT-PCR (Flu A&B, Covid)     Status: None   Collection Time: 12/01/20  2:46 PM   Specimen: Nasopharyngeal(NP) swabs in vial transport medium  Result Value Ref Range Status   SARS Coronavirus 2 by RT PCR NEGATIVE NEGATIVE Final    Comment: (NOTE) SARS-CoV-2 target nucleic acids are NOT DETECTED.  The SARS-CoV-2 RNA is generally detectable in upper respiratory specimens during the acute phase of infection. The lowest concentration of SARS-CoV-2 viral copies this assay can detect is 138 copies/mL. A negative result does not preclude SARS-Cov-2 infection and should not be used as the sole basis for treatment or other patient management decisions. A negative result may occur with  improper specimen collection/handling, submission of specimen other than nasopharyngeal swab, presence of viral mutation(s) within the areas targeted by this assay, and inadequate number of viral copies(<138 copies/mL). A negative result must be combined with clinical observations, patient history, and epidemiological information. The expected result is Negative.  Fact Sheet for Patients:  EntrepreneurPulse.com.au  Fact Sheet for Healthcare Providers:  IncredibleEmployment.be  This test is no t yet approved or cleared by the [place] FDA and  has been authorized for detection and/or diagnosis of SARS-CoV-2 by FDA under an Emergency Use Authorization (EUA). This EUA will remain  in effect (meaning this test can be used) for the duration of the COVID-19 declaration under Section 564(b)(1) of the Act, 21 U.S.C.section 360bbb-3(b)(1), unless the authorization is terminated  or revoked sooner.       Influenza A by PCR NEGATIVE NEGATIVE Final   Influenza B by PCR NEGATIVE NEGATIVE Final    Comment: (NOTE) The Xpert Xpress  SARS-CoV-2/FLU/RSV plus assay is intended as an aid in the diagnosis of influenza from Nasopharyngeal swab specimens and should not be used as a sole basis for treatment. Nasal washings and aspirates are unacceptable for Xpert Xpress SARS-CoV-2/FLU/RSV testing.  Fact Sheet for Patients: EntrepreneurPulse.com.au  Fact Sheet for Healthcare Providers: IncredibleEmployment.be  This test is not yet approved or cleared by the [place] FDA and has been authorized for detection and/or diagnosis of SARS-CoV-2 by FDA under an Emergency Use Authorization (EUA). This EUA will remain in effect (meaning this test can be used) for the duration of the COVID-19 declaration under Section 564(b)(1) of the Act, 21 U.S.C. section 360bbb-3(b)(1), unless the authorization is terminated or revoked.  Performed at [facility], [place] [address] [place], [place] [address]   Urine Culture     Status: Abnormal   Collection Time: 12/01/20  5:00 PM   Specimen: Urine, Random  Result Value Ref Range Status   Specimen Description URINE, RANDOM  Final   Special Requests   Final    NONE Performed at [facility], [place] [address], [place], [place] [address]    Culture (A)  Final    >=100,000 COLONIES/mL ENTEROBACTER AEROGENES >=100,000 COLONIES/mL ESCHERICHIA COLI    Report Status 12/04/2020 FINAL  Final   Organism ID, Bacteria ENTEROBACTER AEROGENES (A)  Final   Organism ID, Bacteria ESCHERICHIA COLI (A)  Final      Susceptibility   Enterobacter aerogenes - MIC*    CEFAZOLIN >=64 RESISTANT Resistant     CEFEPIME <=0.12 SENSITIVE Sensitive     CEFTRIAXONE <=0.25 SENSITIVE Sensitive     CIPROFLOXACIN <=0.25 SENSITIVE Sensitive     GENTAMICIN <=1 SENSITIVE Sensitive     IMIPENEM 1 SENSITIVE Sensitive  NITROFURANTOIN 64 INTERMEDIATE Intermediate     TRIMETH/SULFA <=20 SENSITIVE Sensitive     PIP/TAZO <=4 SENSITIVE Sensitive     * >=100,000 COLONIES/mL  ENTEROBACTER AEROGENES   Escherichia coli - MIC*    AMPICILLIN 4 SENSITIVE Sensitive     CEFAZOLIN <=4 SENSITIVE Sensitive     CEFEPIME <=0.12 SENSITIVE Sensitive     CEFTRIAXONE <=0.25 SENSITIVE Sensitive     CIPROFLOXACIN <=0.25 SENSITIVE Sensitive     GENTAMICIN <=1 SENSITIVE Sensitive     IMIPENEM <=0.25 SENSITIVE Sensitive     NITROFURANTOIN <=16 SENSITIVE Sensitive     TRIMETH/SULFA <=20 SENSITIVE Sensitive     AMPICILLIN/SULBACTAM <=2 SENSITIVE Sensitive     PIP/TAZO <=4 SENSITIVE Sensitive     * >=100,000 COLONIES/mL ESCHERICHIA COLI  Culture, Urine     Status: Abnormal   Collection Time: 12/03/20 11:23 AM   Specimen: Urine, Random  Result Value Ref Range Status   Specimen Description URINE, RANDOM  Final   Special Requests NONE  Final   Culture (A)  Final    <10,000 COLONIES/mL INSIGNIFICANT GROWTH Performed at [facility], [address], [place], [place] [address]    Report Status 12/05/2020 FINAL  Final  Culture, blood (routine x 2)     Status: Abnormal   Collection Time: 12/03/20 12:17 PM   Specimen: BLOOD RIGHT HAND  Result Value Ref Range Status   Specimen Description BLOOD RIGHT HAND  Final   Special Requests   Final    BOTTLES DRAWN AEROBIC AND ANAEROBIC Blood Culture adequate volume   Culture  Setup Time   Final    GRAM POSITIVE COCCI IN BOTH AEROBIC AND ANAEROBIC BOTTLES Gram Stain Report Called to,Read Back By and Verified With: DAVIS A. @ 0932 ON [id number] BY HENDERSON L. CRITICAL RESULT CALLED TO, READ BACK BY AND VERIFIED WITH: L CURRAN PHARMD @1943  12/04/20 EB    Culture (A)  Final    STAPHYLOCOCCUS HOMINIS STAPHYLOCOCCUS CAPITIS STAPHYLOCOCCUS EPIDERMIDIS THE SIGNIFICANCE OF ISOLATING THIS ORGANISM FROM A SINGLE SET OF BLOOD CULTURES WHEN MULTIPLE SETS ARE DRAWN IS UNCERTAIN. PLEASE NOTIFY THE MICROBIOLOGY DEPARTMENT WITHIN ONE WEEK IF SPECIATION AND SENSITIVITIES ARE REQUIRED. Performed at [facility], [place] [address], [place],  [place] [address]    Report Status 12/07/2020 FINAL  Final  Blood Culture ID Panel (Reflexed)     Status: Abnormal   Collection Time: 12/03/20 12:17 PM  Result Value Ref Range Status   Enterococcus faecalis NOT DETECTED NOT DETECTED Final   Enterococcus Faecium NOT DETECTED NOT DETECTED Final   Listeria monocytogenes NOT DETECTED NOT DETECTED Final   Staphylococcus species DETECTED (A) NOT DETECTED Final    Comment: CRITICAL RESULT CALLED TO, READ BACK BY AND VERIFIED WITH: L CURRAN PHARMD @1943  12/04/20 EB    Staphylococcus aureus (BCID) NOT DETECTED NOT DETECTED Final   Staphylococcus epidermidis DETECTED (A) NOT DETECTED Final    Comment: Methicillin (oxacillin) resistant coagulase negative staphylococcus. Possible blood culture contaminant (unless isolated from more than one blood culture draw or clinical case suggests pathogenicity). No antibiotic treatment is indicated for blood  culture contaminants. CRITICAL RESULT CALLED TO, READ BACK BY AND VERIFIED WITH: L CURRAN PHARMD @1943  12/04/20 EB    Staphylococcus lugdunensis NOT DETECTED NOT DETECTED Final   Streptococcus species NOT DETECTED NOT DETECTED Final   Streptococcus agalactiae NOT DETECTED NOT DETECTED Final   Streptococcus pneumoniae NOT DETECTED NOT DETECTED Final   Streptococcus pyogenes NOT DETECTED NOT DETECTED Final  A.calcoaceticus-baumannii NOT DETECTED NOT DETECTED Final   Bacteroides fragilis NOT DETECTED NOT DETECTED Final   Enterobacterales NOT DETECTED NOT DETECTED Final   Enterobacter cloacae complex NOT DETECTED NOT DETECTED Final   Escherichia coli NOT DETECTED NOT DETECTED Final   Klebsiella aerogenes NOT DETECTED NOT DETECTED Final   Klebsiella oxytoca NOT DETECTED NOT DETECTED Final   Klebsiella pneumoniae NOT DETECTED NOT DETECTED Final   Proteus species NOT DETECTED NOT DETECTED Final   Salmonella species NOT DETECTED NOT DETECTED Final   Serratia marcescens NOT DETECTED NOT DETECTED Final   Haemophilus  influenzae NOT DETECTED NOT DETECTED Final   Neisseria meningitidis NOT DETECTED NOT DETECTED Final   Pseudomonas aeruginosa NOT DETECTED NOT DETECTED Final   Stenotrophomonas maltophilia NOT DETECTED NOT DETECTED Final   Candida albicans NOT DETECTED NOT DETECTED Final   Candida auris NOT DETECTED NOT DETECTED Final   Candida glabrata NOT DETECTED NOT DETECTED Final   Candida krusei NOT DETECTED NOT DETECTED Final   Candida parapsilosis NOT DETECTED NOT DETECTED Final   Candida tropicalis NOT DETECTED NOT DETECTED Final   Cryptococcus neoformans/gattii NOT DETECTED NOT DETECTED Final   Methicillin resistance mecA/C DETECTED (A) NOT DETECTED Final    Comment: CRITICAL RESULT CALLED TO, READ BACK BY AND VERIFIED WITH: L CURRAN PHARMD @1943  12/04/20 EB Performed at [facility], [address], [place], [place] [address]   Culture, blood (routine x 2)     Status: None   Collection Time: 12/03/20 12:22 PM   Specimen: BLOOD LEFT HAND  Result Value Ref Range Status   Specimen Description BLOOD LEFT HAND  Final   Special Requests   Final    BOTTLES DRAWN AEROBIC AND ANAEROBIC Blood Culture adequate volume   Culture   Final    NO GROWTH 5 DAYS Performed at [facility], [place] [address], [place],  [address]    Report Status 12/08/2020 FINAL  Final  Resp Panel by RT-PCR (Flu A&B, Covid) Nasopharyngeal Swab     Status: None   Collection Time: 12/09/20  4:44 AM   Specimen: Nasopharyngeal Swab; Nasopharyngeal(NP) swabs in vial transport medium  Result Value Ref Range Status   SARS Coronavirus 2 by RT PCR NEGATIVE NEGATIVE Final    Comment: (NOTE) SARS-CoV-2 target nucleic acids are NOT DETECTED.  The SARS-CoV-2 RNA is generally detectable in upper respiratory specimens during the acute phase of infection. The lowest concentration of SARS-CoV-2 viral copies this assay can detect is 138 copies/mL. A negative result does not preclude SARS-Cov-2 infection and should not be  used as the sole basis for treatment or other patient management decisions. A negative result may occur with  improper specimen collection/handling, submission of specimen other than nasopharyngeal swab, presence of viral mutation(s) within the areas targeted by this assay, and inadequate number of viral copies(<138 copies/mL). A negative result must be combined with clinical observations, patient history, and epidemiological information. The expected result is Negative.  Fact Sheet for Patients:  EntrepreneurPulse.com.au  Fact Sheet for Healthcare Providers:  IncredibleEmployment.be  This test is no t yet approved or cleared by the [place] FDA and  has been authorized for detection and/or diagnosis of SARS-CoV-2 by FDA under an Emergency Use Authorization (EUA). This EUA will remain  in effect (meaning this test can be used) for the duration of the COVID-19 declaration under Section 564(b)(1) of the Act, 21 U.S.C.section 360bbb-3(b)(1), unless the authorization is terminated  or revoked sooner.  Influenza A by PCR NEGATIVE NEGATIVE Final   Influenza B by PCR NEGATIVE NEGATIVE Final    Comment: (NOTE) The Xpert Xpress SARS-CoV-2/FLU/RSV plus assay is intended as an aid in the diagnosis of influenza from Nasopharyngeal swab specimens and should not be used as a sole basis for treatment. Nasal washings and aspirates are unacceptable for Xpert Xpress SARS-CoV-2/FLU/RSV testing.  Fact Sheet for Patients: EntrepreneurPulse.com.au  Fact Sheet for Healthcare Providers: IncredibleEmployment.be  This test is not yet approved or cleared by the [place] FDA and has been authorized for detection and/or diagnosis of SARS-CoV-2 by FDA under an Emergency Use Authorization (EUA). This EUA will remain in effect (meaning this test can be used) for the duration of the COVID-19 declaration under Section  564(b)(1) of the Act, 21 U.S.C. section 360bbb-3(b)(1), unless the authorization is terminated or revoked.  Performed at [facility], [place] [address] [place], [place] [address]      Labs: CBC: Recent Labs  Lab 12/04/20 0127 12/06/20 1209 12/08/20 0840 12/09/20 0058 12/09/20 1253  WBC 11.7* 14.9* 13.9* 13.9* 12.8*  NEUTROABS 9.4*  --  11.4* 11.5*  --   HGB 8.4* 10.8* 8.9* 8.4* 8.9*  HCT 25.4* 32.2* 27.3* 25.6* 27.3*  MCV 94.4 92.8 94.1 93.1 93.5  PLT 292 346 416* 386 151*   Basic Metabolic Panel: Recent Labs  Lab 12/04/20 0127 12/06/20 1209 12/08/20 0840 12/09/20 1253  NA 135 132* 135 133*  K 4.2 5.6* 4.0 4.2  CL 104 100 104 102  CO2 24 22 23 25   GLUCOSE 186* 210* 145* 277*  BUN 17 19 20 18   CREATININE 1.40* 1.33* 1.39* 1.39*  CALCIUM 8.0* 8.7* 8.7* 8.6*   Liver Function Tests: No results for input(s): AST, ALT, ALKPHOS, BILITOT, PROT, ALBUMIN in the last 168 hours. CBG: Recent Labs  Lab 12/08/20 1118 12/08/20 1859 12/08/20 2132 12/09/20 0559 12/09/20 1211  GLUCAP 168* 256* 267* 185* 273*    Time spent: 35 minutes  Signed:  Berle Mull  [facility]  12/09/2020 2:50 PM

## 2020-12-09 NOTE — Progress Notes (Signed)
Report called in to [facility] w/o difficulty. Aware patient my not leave hospital until after 7pm

## 2020-12-22 DIAGNOSIS — D649 Anemia, unspecified: Secondary | ICD-10-CM | POA: Diagnosis not present

## 2020-12-22 DIAGNOSIS — I679 Cerebrovascular disease, unspecified: Secondary | ICD-10-CM | POA: Diagnosis not present

## 2020-12-22 DIAGNOSIS — B9620 Unspecified Escherichia coli [E. coli] as the cause of diseases classified elsewhere: Secondary | ICD-10-CM | POA: Diagnosis not present

## 2020-12-22 DIAGNOSIS — E1165 Type 2 diabetes mellitus with hyperglycemia: Secondary | ICD-10-CM | POA: Diagnosis not present

## 2020-12-22 DIAGNOSIS — R1310 Dysphagia, unspecified: Secondary | ICD-10-CM | POA: Diagnosis not present

## 2020-12-22 DIAGNOSIS — K279 Peptic ulcer, site unspecified, unspecified as acute or chronic, without hemorrhage or perforation: Secondary | ICD-10-CM | POA: Diagnosis not present

## 2020-12-22 DIAGNOSIS — N179 Acute kidney failure, unspecified: Secondary | ICD-10-CM | POA: Diagnosis not present

## 2020-12-22 DIAGNOSIS — F0390 Unspecified dementia without behavioral disturbance: Secondary | ICD-10-CM | POA: Diagnosis not present

## 2020-12-22 DIAGNOSIS — R5381 Other malaise: Secondary | ICD-10-CM | POA: Diagnosis not present

## 2020-12-27 DIAGNOSIS — F0280 Dementia in other diseases classified elsewhere without behavioral disturbance: Secondary | ICD-10-CM | POA: Diagnosis not present

## 2020-12-27 DIAGNOSIS — G8191 Hemiplegia, unspecified affecting right dominant side: Secondary | ICD-10-CM | POA: Diagnosis not present

## 2020-12-27 DIAGNOSIS — K279 Peptic ulcer, site unspecified, unspecified as acute or chronic, without hemorrhage or perforation: Secondary | ICD-10-CM | POA: Diagnosis not present

## 2020-12-27 DIAGNOSIS — E1165 Type 2 diabetes mellitus with hyperglycemia: Secondary | ICD-10-CM | POA: Diagnosis not present

## 2020-12-27 DIAGNOSIS — E038 Other specified hypothyroidism: Secondary | ICD-10-CM | POA: Diagnosis not present

## 2020-12-27 DIAGNOSIS — I6789 Other cerebrovascular disease: Secondary | ICD-10-CM | POA: Diagnosis not present

## 2020-12-27 DIAGNOSIS — R5381 Other malaise: Secondary | ICD-10-CM | POA: Diagnosis not present

## 2020-12-31 DIAGNOSIS — I2510 Atherosclerotic heart disease of native coronary artery without angina pectoris: Secondary | ICD-10-CM | POA: Diagnosis not present

## 2020-12-31 DIAGNOSIS — N400 Enlarged prostate without lower urinary tract symptoms: Secondary | ICD-10-CM | POA: Diagnosis not present

## 2020-12-31 DIAGNOSIS — I10 Essential (primary) hypertension: Secondary | ICD-10-CM | POA: Diagnosis not present

## 2020-12-31 DIAGNOSIS — I69354 Hemiplegia and hemiparesis following cerebral infarction affecting left non-dominant side: Secondary | ICD-10-CM | POA: Diagnosis not present

## 2020-12-31 DIAGNOSIS — N179 Acute kidney failure, unspecified: Secondary | ICD-10-CM | POA: Diagnosis not present

## 2020-12-31 DIAGNOSIS — D649 Anemia, unspecified: Secondary | ICD-10-CM | POA: Diagnosis not present

## 2020-12-31 DIAGNOSIS — E1140 Type 2 diabetes mellitus with diabetic neuropathy, unspecified: Secondary | ICD-10-CM | POA: Diagnosis not present

## 2020-12-31 DIAGNOSIS — E113553 Type 2 diabetes mellitus with stable proliferative diabetic retinopathy, bilateral: Secondary | ICD-10-CM | POA: Diagnosis not present

## 2020-12-31 DIAGNOSIS — E860 Dehydration: Secondary | ICD-10-CM | POA: Diagnosis not present

## 2021-01-03 DIAGNOSIS — E860 Dehydration: Secondary | ICD-10-CM | POA: Diagnosis not present

## 2021-01-03 DIAGNOSIS — I69354 Hemiplegia and hemiparesis following cerebral infarction affecting left non-dominant side: Secondary | ICD-10-CM | POA: Diagnosis not present

## 2021-01-03 DIAGNOSIS — D649 Anemia, unspecified: Secondary | ICD-10-CM | POA: Diagnosis not present

## 2021-01-03 DIAGNOSIS — I10 Essential (primary) hypertension: Secondary | ICD-10-CM | POA: Diagnosis not present

## 2021-01-03 DIAGNOSIS — E1140 Type 2 diabetes mellitus with diabetic neuropathy, unspecified: Secondary | ICD-10-CM | POA: Diagnosis not present

## 2021-01-03 DIAGNOSIS — N400 Enlarged prostate without lower urinary tract symptoms: Secondary | ICD-10-CM | POA: Diagnosis not present

## 2021-01-03 DIAGNOSIS — I2510 Atherosclerotic heart disease of native coronary artery without angina pectoris: Secondary | ICD-10-CM | POA: Diagnosis not present

## 2021-01-03 DIAGNOSIS — N179 Acute kidney failure, unspecified: Secondary | ICD-10-CM | POA: Diagnosis not present

## 2021-01-03 DIAGNOSIS — E113553 Type 2 diabetes mellitus with stable proliferative diabetic retinopathy, bilateral: Secondary | ICD-10-CM | POA: Diagnosis not present

## 2021-01-05 DIAGNOSIS — N179 Acute kidney failure, unspecified: Secondary | ICD-10-CM | POA: Diagnosis not present

## 2021-01-05 DIAGNOSIS — I69354 Hemiplegia and hemiparesis following cerebral infarction affecting left non-dominant side: Secondary | ICD-10-CM | POA: Diagnosis not present

## 2021-01-05 DIAGNOSIS — I10 Essential (primary) hypertension: Secondary | ICD-10-CM | POA: Diagnosis not present

## 2021-01-05 DIAGNOSIS — I2510 Atherosclerotic heart disease of native coronary artery without angina pectoris: Secondary | ICD-10-CM | POA: Diagnosis not present

## 2021-01-05 DIAGNOSIS — N400 Enlarged prostate without lower urinary tract symptoms: Secondary | ICD-10-CM | POA: Diagnosis not present

## 2021-01-05 DIAGNOSIS — D649 Anemia, unspecified: Secondary | ICD-10-CM | POA: Diagnosis not present

## 2021-01-05 DIAGNOSIS — E113553 Type 2 diabetes mellitus with stable proliferative diabetic retinopathy, bilateral: Secondary | ICD-10-CM | POA: Diagnosis not present

## 2021-01-05 DIAGNOSIS — E1140 Type 2 diabetes mellitus with diabetic neuropathy, unspecified: Secondary | ICD-10-CM | POA: Diagnosis not present

## 2021-01-05 DIAGNOSIS — E860 Dehydration: Secondary | ICD-10-CM | POA: Diagnosis not present

## 2021-01-10 DIAGNOSIS — I2510 Atherosclerotic heart disease of native coronary artery without angina pectoris: Secondary | ICD-10-CM | POA: Diagnosis not present

## 2021-01-10 DIAGNOSIS — E1140 Type 2 diabetes mellitus with diabetic neuropathy, unspecified: Secondary | ICD-10-CM | POA: Diagnosis not present

## 2021-01-10 DIAGNOSIS — N400 Enlarged prostate without lower urinary tract symptoms: Secondary | ICD-10-CM | POA: Diagnosis not present

## 2021-01-10 DIAGNOSIS — E860 Dehydration: Secondary | ICD-10-CM | POA: Diagnosis not present

## 2021-01-10 DIAGNOSIS — E113553 Type 2 diabetes mellitus with stable proliferative diabetic retinopathy, bilateral: Secondary | ICD-10-CM | POA: Diagnosis not present

## 2021-01-10 DIAGNOSIS — I10 Essential (primary) hypertension: Secondary | ICD-10-CM | POA: Diagnosis not present

## 2021-01-10 DIAGNOSIS — I69354 Hemiplegia and hemiparesis following cerebral infarction affecting left non-dominant side: Secondary | ICD-10-CM | POA: Diagnosis not present

## 2021-01-10 DIAGNOSIS — D649 Anemia, unspecified: Secondary | ICD-10-CM | POA: Diagnosis not present

## 2021-01-10 DIAGNOSIS — N179 Acute kidney failure, unspecified: Secondary | ICD-10-CM | POA: Diagnosis not present

## 2021-01-12 DIAGNOSIS — N179 Acute kidney failure, unspecified: Secondary | ICD-10-CM | POA: Diagnosis not present

## 2021-01-12 DIAGNOSIS — I69354 Hemiplegia and hemiparesis following cerebral infarction affecting left non-dominant side: Secondary | ICD-10-CM | POA: Diagnosis not present

## 2021-01-12 DIAGNOSIS — E113553 Type 2 diabetes mellitus with stable proliferative diabetic retinopathy, bilateral: Secondary | ICD-10-CM | POA: Diagnosis not present

## 2021-01-12 DIAGNOSIS — I2510 Atherosclerotic heart disease of native coronary artery without angina pectoris: Secondary | ICD-10-CM | POA: Diagnosis not present

## 2021-01-12 DIAGNOSIS — I10 Essential (primary) hypertension: Secondary | ICD-10-CM | POA: Diagnosis not present

## 2021-01-12 DIAGNOSIS — E1140 Type 2 diabetes mellitus with diabetic neuropathy, unspecified: Secondary | ICD-10-CM | POA: Diagnosis not present

## 2021-01-12 DIAGNOSIS — D649 Anemia, unspecified: Secondary | ICD-10-CM | POA: Diagnosis not present

## 2021-01-12 DIAGNOSIS — N400 Enlarged prostate without lower urinary tract symptoms: Secondary | ICD-10-CM | POA: Diagnosis not present

## 2021-01-12 DIAGNOSIS — E860 Dehydration: Secondary | ICD-10-CM | POA: Diagnosis not present

## 2021-01-13 DIAGNOSIS — I69354 Hemiplegia and hemiparesis following cerebral infarction affecting left non-dominant side: Secondary | ICD-10-CM | POA: Diagnosis not present

## 2021-01-13 DIAGNOSIS — I10 Essential (primary) hypertension: Secondary | ICD-10-CM | POA: Diagnosis not present

## 2021-01-13 DIAGNOSIS — E113553 Type 2 diabetes mellitus with stable proliferative diabetic retinopathy, bilateral: Secondary | ICD-10-CM | POA: Diagnosis not present

## 2021-01-13 DIAGNOSIS — E1140 Type 2 diabetes mellitus with diabetic neuropathy, unspecified: Secondary | ICD-10-CM | POA: Diagnosis not present

## 2021-01-13 DIAGNOSIS — D649 Anemia, unspecified: Secondary | ICD-10-CM | POA: Diagnosis not present

## 2021-01-13 DIAGNOSIS — N179 Acute kidney failure, unspecified: Secondary | ICD-10-CM | POA: Diagnosis not present

## 2021-01-13 DIAGNOSIS — I2510 Atherosclerotic heart disease of native coronary artery without angina pectoris: Secondary | ICD-10-CM | POA: Diagnosis not present

## 2021-01-13 DIAGNOSIS — N400 Enlarged prostate without lower urinary tract symptoms: Secondary | ICD-10-CM | POA: Diagnosis not present

## 2021-01-13 DIAGNOSIS — E860 Dehydration: Secondary | ICD-10-CM | POA: Diagnosis not present

## 2021-01-17 DIAGNOSIS — D649 Anemia, unspecified: Secondary | ICD-10-CM | POA: Diagnosis not present

## 2021-01-17 DIAGNOSIS — E860 Dehydration: Secondary | ICD-10-CM | POA: Diagnosis not present

## 2021-01-17 DIAGNOSIS — I10 Essential (primary) hypertension: Secondary | ICD-10-CM | POA: Diagnosis not present

## 2021-01-17 DIAGNOSIS — N179 Acute kidney failure, unspecified: Secondary | ICD-10-CM | POA: Diagnosis not present

## 2021-01-18 DIAGNOSIS — N400 Enlarged prostate without lower urinary tract symptoms: Secondary | ICD-10-CM | POA: Diagnosis not present

## 2021-01-18 DIAGNOSIS — D649 Anemia, unspecified: Secondary | ICD-10-CM | POA: Diagnosis not present

## 2021-01-18 DIAGNOSIS — I2510 Atherosclerotic heart disease of native coronary artery without angina pectoris: Secondary | ICD-10-CM | POA: Diagnosis not present

## 2021-01-18 DIAGNOSIS — I69354 Hemiplegia and hemiparesis following cerebral infarction affecting left non-dominant side: Secondary | ICD-10-CM | POA: Diagnosis not present

## 2021-01-18 DIAGNOSIS — E113553 Type 2 diabetes mellitus with stable proliferative diabetic retinopathy, bilateral: Secondary | ICD-10-CM | POA: Diagnosis not present

## 2021-01-18 DIAGNOSIS — N179 Acute kidney failure, unspecified: Secondary | ICD-10-CM | POA: Diagnosis not present

## 2021-01-18 DIAGNOSIS — E1140 Type 2 diabetes mellitus with diabetic neuropathy, unspecified: Secondary | ICD-10-CM | POA: Diagnosis not present

## 2021-01-18 DIAGNOSIS — I10 Essential (primary) hypertension: Secondary | ICD-10-CM | POA: Diagnosis not present

## 2021-01-18 DIAGNOSIS — E860 Dehydration: Secondary | ICD-10-CM | POA: Diagnosis not present

## 2021-01-20 DIAGNOSIS — E1140 Type 2 diabetes mellitus with diabetic neuropathy, unspecified: Secondary | ICD-10-CM | POA: Diagnosis not present

## 2021-01-20 DIAGNOSIS — I10 Essential (primary) hypertension: Secondary | ICD-10-CM | POA: Diagnosis not present

## 2021-01-20 DIAGNOSIS — D649 Anemia, unspecified: Secondary | ICD-10-CM | POA: Diagnosis not present

## 2021-01-20 DIAGNOSIS — E860 Dehydration: Secondary | ICD-10-CM | POA: Diagnosis not present

## 2021-01-20 DIAGNOSIS — E113553 Type 2 diabetes mellitus with stable proliferative diabetic retinopathy, bilateral: Secondary | ICD-10-CM | POA: Diagnosis not present

## 2021-01-20 DIAGNOSIS — N400 Enlarged prostate without lower urinary tract symptoms: Secondary | ICD-10-CM | POA: Diagnosis not present

## 2021-01-20 DIAGNOSIS — I2510 Atherosclerotic heart disease of native coronary artery without angina pectoris: Secondary | ICD-10-CM | POA: Diagnosis not present

## 2021-01-20 DIAGNOSIS — I69354 Hemiplegia and hemiparesis following cerebral infarction affecting left non-dominant side: Secondary | ICD-10-CM | POA: Diagnosis not present

## 2021-01-20 DIAGNOSIS — N179 Acute kidney failure, unspecified: Secondary | ICD-10-CM | POA: Diagnosis not present

## 2021-01-24 DIAGNOSIS — N400 Enlarged prostate without lower urinary tract symptoms: Secondary | ICD-10-CM | POA: Diagnosis not present

## 2021-01-24 DIAGNOSIS — E1140 Type 2 diabetes mellitus with diabetic neuropathy, unspecified: Secondary | ICD-10-CM | POA: Diagnosis not present

## 2021-01-24 DIAGNOSIS — I10 Essential (primary) hypertension: Secondary | ICD-10-CM | POA: Diagnosis not present

## 2021-01-24 DIAGNOSIS — N179 Acute kidney failure, unspecified: Secondary | ICD-10-CM | POA: Diagnosis not present

## 2021-01-24 DIAGNOSIS — D649 Anemia, unspecified: Secondary | ICD-10-CM | POA: Diagnosis not present

## 2021-01-24 DIAGNOSIS — E860 Dehydration: Secondary | ICD-10-CM | POA: Diagnosis not present

## 2021-01-24 DIAGNOSIS — I2510 Atherosclerotic heart disease of native coronary artery without angina pectoris: Secondary | ICD-10-CM | POA: Diagnosis not present

## 2021-01-24 DIAGNOSIS — I69354 Hemiplegia and hemiparesis following cerebral infarction affecting left non-dominant side: Secondary | ICD-10-CM | POA: Diagnosis not present

## 2021-01-24 DIAGNOSIS — E113553 Type 2 diabetes mellitus with stable proliferative diabetic retinopathy, bilateral: Secondary | ICD-10-CM | POA: Diagnosis not present

## 2021-01-29 DIAGNOSIS — E113553 Type 2 diabetes mellitus with stable proliferative diabetic retinopathy, bilateral: Secondary | ICD-10-CM | POA: Diagnosis not present

## 2021-01-29 DIAGNOSIS — N179 Acute kidney failure, unspecified: Secondary | ICD-10-CM | POA: Diagnosis not present

## 2021-01-29 DIAGNOSIS — I7091 Generalized atherosclerosis: Secondary | ICD-10-CM | POA: Diagnosis not present

## 2021-01-29 DIAGNOSIS — G8314 Monoplegia of lower limb affecting left nondominant side: Secondary | ICD-10-CM | POA: Diagnosis not present

## 2021-01-30 DIAGNOSIS — D649 Anemia, unspecified: Secondary | ICD-10-CM | POA: Diagnosis not present

## 2021-01-30 DIAGNOSIS — N179 Acute kidney failure, unspecified: Secondary | ICD-10-CM | POA: Diagnosis not present

## 2021-01-30 DIAGNOSIS — E1140 Type 2 diabetes mellitus with diabetic neuropathy, unspecified: Secondary | ICD-10-CM | POA: Diagnosis not present

## 2021-01-30 DIAGNOSIS — E113553 Type 2 diabetes mellitus with stable proliferative diabetic retinopathy, bilateral: Secondary | ICD-10-CM | POA: Diagnosis not present

## 2021-01-30 DIAGNOSIS — I2510 Atherosclerotic heart disease of native coronary artery without angina pectoris: Secondary | ICD-10-CM | POA: Diagnosis not present

## 2021-01-30 DIAGNOSIS — E860 Dehydration: Secondary | ICD-10-CM | POA: Diagnosis not present

## 2021-01-30 DIAGNOSIS — I10 Essential (primary) hypertension: Secondary | ICD-10-CM | POA: Diagnosis not present

## 2021-01-30 DIAGNOSIS — I69354 Hemiplegia and hemiparesis following cerebral infarction affecting left non-dominant side: Secondary | ICD-10-CM | POA: Diagnosis not present

## 2021-01-30 DIAGNOSIS — N400 Enlarged prostate without lower urinary tract symptoms: Secondary | ICD-10-CM | POA: Diagnosis not present

## 2021-02-02 DIAGNOSIS — E113553 Type 2 diabetes mellitus with stable proliferative diabetic retinopathy, bilateral: Secondary | ICD-10-CM | POA: Diagnosis not present

## 2021-02-02 DIAGNOSIS — I69354 Hemiplegia and hemiparesis following cerebral infarction affecting left non-dominant side: Secondary | ICD-10-CM | POA: Diagnosis not present

## 2021-02-02 DIAGNOSIS — N400 Enlarged prostate without lower urinary tract symptoms: Secondary | ICD-10-CM | POA: Diagnosis not present

## 2021-02-02 DIAGNOSIS — E1140 Type 2 diabetes mellitus with diabetic neuropathy, unspecified: Secondary | ICD-10-CM | POA: Diagnosis not present

## 2021-02-02 DIAGNOSIS — D649 Anemia, unspecified: Secondary | ICD-10-CM | POA: Diagnosis not present

## 2021-02-02 DIAGNOSIS — N179 Acute kidney failure, unspecified: Secondary | ICD-10-CM | POA: Diagnosis not present

## 2021-02-02 DIAGNOSIS — E860 Dehydration: Secondary | ICD-10-CM | POA: Diagnosis not present

## 2021-02-02 DIAGNOSIS — I2510 Atherosclerotic heart disease of native coronary artery without angina pectoris: Secondary | ICD-10-CM | POA: Diagnosis not present

## 2021-02-02 DIAGNOSIS — I10 Essential (primary) hypertension: Secondary | ICD-10-CM | POA: Diagnosis not present

## 2021-02-09 DIAGNOSIS — E1140 Type 2 diabetes mellitus with diabetic neuropathy, unspecified: Secondary | ICD-10-CM | POA: Diagnosis not present

## 2021-02-09 DIAGNOSIS — I10 Essential (primary) hypertension: Secondary | ICD-10-CM | POA: Diagnosis not present

## 2021-02-09 DIAGNOSIS — I69354 Hemiplegia and hemiparesis following cerebral infarction affecting left non-dominant side: Secondary | ICD-10-CM | POA: Diagnosis not present

## 2021-02-09 DIAGNOSIS — I2510 Atherosclerotic heart disease of native coronary artery without angina pectoris: Secondary | ICD-10-CM | POA: Diagnosis not present

## 2021-02-09 DIAGNOSIS — N179 Acute kidney failure, unspecified: Secondary | ICD-10-CM | POA: Diagnosis not present

## 2021-02-09 DIAGNOSIS — N400 Enlarged prostate without lower urinary tract symptoms: Secondary | ICD-10-CM | POA: Diagnosis not present

## 2021-02-09 DIAGNOSIS — D649 Anemia, unspecified: Secondary | ICD-10-CM | POA: Diagnosis not present

## 2021-02-09 DIAGNOSIS — E860 Dehydration: Secondary | ICD-10-CM | POA: Diagnosis not present

## 2021-02-09 DIAGNOSIS — E113553 Type 2 diabetes mellitus with stable proliferative diabetic retinopathy, bilateral: Secondary | ICD-10-CM | POA: Diagnosis not present

## 2021-02-15 DIAGNOSIS — E860 Dehydration: Secondary | ICD-10-CM | POA: Diagnosis not present

## 2021-02-15 DIAGNOSIS — D649 Anemia, unspecified: Secondary | ICD-10-CM | POA: Diagnosis not present

## 2021-02-15 DIAGNOSIS — E113553 Type 2 diabetes mellitus with stable proliferative diabetic retinopathy, bilateral: Secondary | ICD-10-CM | POA: Diagnosis not present

## 2021-02-15 DIAGNOSIS — N179 Acute kidney failure, unspecified: Secondary | ICD-10-CM | POA: Diagnosis not present

## 2021-02-15 DIAGNOSIS — N400 Enlarged prostate without lower urinary tract symptoms: Secondary | ICD-10-CM | POA: Diagnosis not present

## 2021-02-15 DIAGNOSIS — I69354 Hemiplegia and hemiparesis following cerebral infarction affecting left non-dominant side: Secondary | ICD-10-CM | POA: Diagnosis not present

## 2021-02-15 DIAGNOSIS — I2510 Atherosclerotic heart disease of native coronary artery without angina pectoris: Secondary | ICD-10-CM | POA: Diagnosis not present

## 2021-02-15 DIAGNOSIS — E1140 Type 2 diabetes mellitus with diabetic neuropathy, unspecified: Secondary | ICD-10-CM | POA: Diagnosis not present

## 2021-02-15 DIAGNOSIS — I10 Essential (primary) hypertension: Secondary | ICD-10-CM | POA: Diagnosis not present

## 2021-02-21 ENCOUNTER — Other Ambulatory Visit: Payer: Self-pay

## 2021-02-21 ENCOUNTER — Encounter (HOSPITAL_COMMUNITY): Payer: Self-pay | Admitting: Internal Medicine

## 2021-02-21 ENCOUNTER — Emergency Department (HOSPITAL_COMMUNITY): Payer: Medicare HMO

## 2021-02-21 ENCOUNTER — Inpatient Hospital Stay (HOSPITAL_COMMUNITY)
Admission: EM | Admit: 2021-02-21 | Discharge: 2021-02-24 | DRG: 071 | Disposition: A | Discharge status: Home Health | Payer: Medicare HMO | Attending: Internal Medicine | Admitting: Internal Medicine

## 2021-02-21 DIAGNOSIS — Z79890 Hormone replacement therapy (postmenopausal): Secondary | ICD-10-CM | POA: Diagnosis not present

## 2021-02-21 DIAGNOSIS — I10 Essential (primary) hypertension: Secondary | ICD-10-CM | POA: Diagnosis not present

## 2021-02-21 DIAGNOSIS — E860 Dehydration: Secondary | ICD-10-CM | POA: Diagnosis present

## 2021-02-21 DIAGNOSIS — Z79899 Other long term (current) drug therapy: Secondary | ICD-10-CM

## 2021-02-21 DIAGNOSIS — I129 Hypertensive chronic kidney disease with stage 1 through stage 4 chronic kidney disease, or unspecified chronic kidney disease: Secondary | ICD-10-CM | POA: Diagnosis not present

## 2021-02-21 DIAGNOSIS — E039 Hypothyroidism, unspecified: Secondary | ICD-10-CM | POA: Diagnosis present

## 2021-02-21 DIAGNOSIS — N390 Urinary tract infection, site not specified: Secondary | ICD-10-CM | POA: Diagnosis present

## 2021-02-21 DIAGNOSIS — Z833 Family history of diabetes mellitus: Secondary | ICD-10-CM | POA: Diagnosis not present

## 2021-02-21 DIAGNOSIS — R443 Hallucinations, unspecified: Secondary | ICD-10-CM | POA: Diagnosis present

## 2021-02-21 DIAGNOSIS — E113593 Type 2 diabetes mellitus with proliferative diabetic retinopathy without macular edema, bilateral: Secondary | ICD-10-CM | POA: Diagnosis present

## 2021-02-21 DIAGNOSIS — E440 Moderate protein-calorie malnutrition: Secondary | ICD-10-CM | POA: Diagnosis not present

## 2021-02-21 DIAGNOSIS — Z7982 Long term (current) use of aspirin: Secondary | ICD-10-CM | POA: Diagnosis not present

## 2021-02-21 DIAGNOSIS — F0390 Unspecified dementia without behavioral disturbance: Secondary | ICD-10-CM | POA: Diagnosis not present

## 2021-02-21 DIAGNOSIS — R4020 Unspecified coma: Secondary | ICD-10-CM | POA: Diagnosis not present

## 2021-02-21 DIAGNOSIS — R001 Bradycardia, unspecified: Secondary | ICD-10-CM | POA: Diagnosis not present

## 2021-02-21 DIAGNOSIS — R4182 Altered mental status, unspecified: Secondary | ICD-10-CM | POA: Diagnosis present

## 2021-02-21 DIAGNOSIS — Z6827 Body mass index (BMI) 27.0-27.9, adult: Secondary | ICD-10-CM

## 2021-02-21 DIAGNOSIS — N1831 Chronic kidney disease, stage 3a: Secondary | ICD-10-CM | POA: Diagnosis present

## 2021-02-21 DIAGNOSIS — Z888 Allergy status to other drugs, medicaments and biological substances status: Secondary | ICD-10-CM

## 2021-02-21 DIAGNOSIS — Z993 Dependence on wheelchair: Secondary | ICD-10-CM

## 2021-02-21 DIAGNOSIS — E119 Type 2 diabetes mellitus without complications: Secondary | ICD-10-CM

## 2021-02-21 DIAGNOSIS — R55 Syncope and collapse: Secondary | ICD-10-CM | POA: Diagnosis not present

## 2021-02-21 DIAGNOSIS — Z794 Long term (current) use of insulin: Secondary | ICD-10-CM

## 2021-02-21 DIAGNOSIS — N400 Enlarged prostate without lower urinary tract symptoms: Secondary | ICD-10-CM | POA: Diagnosis present

## 2021-02-21 DIAGNOSIS — Z66 Do not resuscitate: Secondary | ICD-10-CM | POA: Diagnosis not present

## 2021-02-21 DIAGNOSIS — R627 Adult failure to thrive: Secondary | ICD-10-CM | POA: Diagnosis not present

## 2021-02-21 DIAGNOSIS — Z8673 Personal history of transient ischemic attack (TIA), and cerebral infarction without residual deficits: Secondary | ICD-10-CM | POA: Diagnosis not present

## 2021-02-21 DIAGNOSIS — G9341 Metabolic encephalopathy: Secondary | ICD-10-CM | POA: Diagnosis not present

## 2021-02-21 DIAGNOSIS — Z20822 Contact with and (suspected) exposure to covid-19: Secondary | ICD-10-CM | POA: Diagnosis present

## 2021-02-21 DIAGNOSIS — J9811 Atelectasis: Secondary | ICD-10-CM | POA: Diagnosis not present

## 2021-02-21 DIAGNOSIS — N183 Chronic kidney disease, stage 3 unspecified: Secondary | ICD-10-CM | POA: Diagnosis present

## 2021-02-21 DIAGNOSIS — E1122 Type 2 diabetes mellitus with diabetic chronic kidney disease: Secondary | ICD-10-CM | POA: Diagnosis present

## 2021-02-21 DIAGNOSIS — I959 Hypotension, unspecified: Secondary | ICD-10-CM | POA: Diagnosis not present

## 2021-02-21 DIAGNOSIS — Z8249 Family history of ischemic heart disease and other diseases of the circulatory system: Secondary | ICD-10-CM | POA: Diagnosis not present

## 2021-02-21 DIAGNOSIS — G14 Postpolio syndrome: Secondary | ICD-10-CM | POA: Diagnosis present

## 2021-02-21 LAB — CBC WITH DIFFERENTIAL/PLATELET
Abs Immature Granulocytes: 0.03 10*3/uL (ref 0.00–0.07)
Basophils Absolute: 0 10*3/uL (ref 0.0–0.1)
Basophils Relative: 0 %
Eosinophils Absolute: 0 10*3/uL (ref 0.0–0.5)
Eosinophils Relative: 0 %
HCT: 43 % (ref 39.0–52.0)
Hemoglobin: 13.6 g/dL (ref 13.0–17.0)
Immature Granulocytes: 0 %
Lymphocytes Relative: 15 %
Lymphs Abs: 1.5 10*3/uL (ref 0.7–4.0)
MCH: 29.7 pg (ref 26.0–34.0)
MCHC: 31.6 g/dL (ref 30.0–36.0)
MCV: 93.9 fL (ref 80.0–100.0)
Monocytes Absolute: 0.6 10*3/uL (ref 0.1–1.0)
Monocytes Relative: 6 %
Neutro Abs: 7.5 10*3/uL (ref 1.7–7.7)
Neutrophils Relative %: 79 %
Platelets: 264 10*3/uL (ref 150–400)
RBC: 4.58 MIL/uL (ref 4.22–5.81)
RDW: 15.9 % — ABNORMAL HIGH (ref 11.5–15.5)
WBC: 9.6 10*3/uL (ref 4.0–10.5)
nRBC: 0 % (ref 0.0–0.2)

## 2021-02-21 LAB — I-STAT ARTERIAL BLOOD GAS, ED
Acid-base deficit: 1 mmol/L (ref 0.0–2.0)
Bicarbonate: 24.8 mmol/L (ref 20.0–28.0)
Calcium, Ion: 1.28 mmol/L (ref 1.15–1.40)
HCT: 36 % — ABNORMAL LOW (ref 39.0–52.0)
Hemoglobin: 12.2 g/dL — ABNORMAL LOW (ref 13.0–17.0)
O2 Saturation: 98 %
Patient temperature: 97.7
Potassium: 4.1 mmol/L (ref 3.5–5.1)
Sodium: 141 mmol/L (ref 135–145)
TCO2: 26 mmol/L (ref 22–32)
pCO2 arterial: 43.7 mmHg (ref 32.0–48.0)
pH, Arterial: 7.36 (ref 7.350–7.450)
pO2, Arterial: 118 mmHg — ABNORMAL HIGH (ref 83.0–108.0)

## 2021-02-21 LAB — COMPREHENSIVE METABOLIC PANEL
ALT: 18 U/L (ref 0–44)
AST: 20 U/L (ref 15–41)
Albumin: 3.4 g/dL — ABNORMAL LOW (ref 3.5–5.0)
Alkaline Phosphatase: 139 U/L — ABNORMAL HIGH (ref 38–126)
Anion gap: 9 (ref 5–15)
BUN: 19 mg/dL (ref 8–23)
CO2: 28 mmol/L (ref 22–32)
Calcium: 9.7 mg/dL (ref 8.9–10.3)
Chloride: 98 mmol/L (ref 98–111)
Creatinine, Ser: 1.5 mg/dL — ABNORMAL HIGH (ref 0.61–1.24)
GFR, Estimated: 46 mL/min — ABNORMAL LOW (ref >60)
Glucose, Bld: 226 mg/dL — ABNORMAL HIGH (ref 70–99)
Potassium: 4.4 mmol/L (ref 3.5–5.1)
Sodium: 135 mmol/L (ref 135–145)
Total Bilirubin: 0.7 mg/dL (ref 0.3–1.2)
Total Protein: 8.4 g/dL — ABNORMAL HIGH (ref 6.5–8.1)

## 2021-02-21 LAB — I-STAT VENOUS BLOOD GAS, ED
Acid-Base Excess: 1 mmol/L (ref 0.0–2.0)
Bicarbonate: 29.7 mmol/L — ABNORMAL HIGH (ref 20.0–28.0)
Calcium, Ion: 1.29 mmol/L (ref 1.15–1.40)
HCT: 45 % (ref 39.0–52.0)
Hemoglobin: 15.3 g/dL (ref 13.0–17.0)
O2 Saturation: 74 %
Potassium: 4.5 mmol/L (ref 3.5–5.1)
Sodium: 141 mmol/L (ref 135–145)
TCO2: 32 mmol/L (ref 22–32)
pCO2, Ven: 62.3 mmHg — ABNORMAL HIGH (ref 44.0–60.0)
pH, Ven: 7.286 (ref 7.250–7.430)
pO2, Ven: 46 mmHg — ABNORMAL HIGH (ref 32.0–45.0)

## 2021-02-21 LAB — AMMONIA: Ammonia: <9 umol/L — ABNORMAL LOW (ref 9–35)

## 2021-02-21 LAB — URINALYSIS, COMPLETE (UACMP) WITH MICROSCOPIC
Bacteria, UA: NONE SEEN
Bilirubin Urine: NEGATIVE
Glucose, UA: NEGATIVE mg/dL
Ketones, ur: NEGATIVE mg/dL
Nitrite: NEGATIVE
Protein, ur: 100 mg/dL — AB
RBC / HPF: >50 RBC/hpf — ABNORMAL HIGH (ref 0–5)
Specific Gravity, Urine: 1.018 (ref 1.005–1.030)
WBC, UA: >50 WBC/hpf — ABNORMAL HIGH (ref 0–5)
pH: 7 (ref 5.0–8.0)

## 2021-02-21 LAB — SARS CORONAVIRUS 2 (TAT 6-24 HRS): SARS Coronavirus 2: NEGATIVE

## 2021-02-21 LAB — CBG MONITORING, ED
Glucose-Capillary: 183 mg/dL — ABNORMAL HIGH (ref 70–99)
Glucose-Capillary: 174 mg/dL — ABNORMAL HIGH (ref 70–99)

## 2021-02-21 MED ORDER — PANTOPRAZOLE SODIUM 40 MG PO TBEC
40.0000 mg | DELAYED_RELEASE_TABLET | Freq: Two times a day (BID) | ORAL | Status: DC
  Administered 2021-02-22 – 2021-02-24 (×5): 40 mg via ORAL
  Filled 2021-02-21 (×4): qty 1
  Filled 2021-02-21: qty 2

## 2021-02-21 MED ORDER — TAMSULOSIN HCL 0.4 MG PO CAPS
0.4000 mg | ORAL_CAPSULE | Freq: Two times a day (BID) | ORAL | Status: DC
  Administered 2021-02-21 – 2021-02-24 (×6): 0.4 mg via ORAL
  Filled 2021-02-21 (×6): qty 1

## 2021-02-21 MED ORDER — NETARSUDIL DIMESYLATE 0.02 % OP SOLN
1.0000 [drp] | Freq: Every day | OPHTHALMIC | Status: DC
  Administered 2021-02-22 – 2021-02-23 (×2): 1 [drp] via OPHTHALMIC

## 2021-02-21 MED ORDER — ACETAMINOPHEN 650 MG RE SUPP: 650.0000 mg | Freq: Four times a day (QID) | RECTAL | Status: DC | PRN

## 2021-02-21 MED ORDER — SODIUM CHLORIDE 0.9 % IV SOLN
1.0000 g | Freq: Once | INTRAVENOUS | Status: AC
  Administered 2021-02-21: 1 g via INTRAVENOUS
  Filled 2021-02-21: qty 10

## 2021-02-21 MED ORDER — LEVOTHYROXINE SODIUM 88 MCG PO TABS
88.0000 ug | ORAL_TABLET | Freq: Every day | ORAL | Status: DC
  Administered 2021-02-21 – 2021-02-23 (×3): 88 ug via ORAL
  Filled 2021-02-21 (×3): qty 1

## 2021-02-21 MED ORDER — LATANOPROST 0.005 % OP SOLN
1.0000 [drp] | Freq: Every day | OPHTHALMIC | Status: DC
  Administered 2021-02-22 – 2021-02-23 (×3): 1 [drp] via OPHTHALMIC
  Filled 2021-02-21: qty 2.5

## 2021-02-21 MED ORDER — SODIUM CHLORIDE 0.9 % IV BOLUS
1000.0000 mL | Freq: Once | INTRAVENOUS | Status: AC
  Administered 2021-02-21: 1000 mL via INTRAVENOUS

## 2021-02-21 MED ORDER — ENOXAPARIN SODIUM 30 MG/0.3ML IJ SOSY
30.0000 mg | PREFILLED_SYRINGE | INTRAMUSCULAR | Status: DC
  Administered 2021-02-21 – 2021-02-23 (×3): 30 mg via SUBCUTANEOUS
  Filled 2021-02-21 (×3): qty 0.3

## 2021-02-21 MED ORDER — POLYETHYLENE GLYCOL 3350 17 G PO PACK
17.0000 g | PACK | Freq: Every day | ORAL | Status: DC
  Administered 2021-02-22 – 2021-02-23 (×2): 17 g via ORAL
  Filled 2021-02-21 (×2): qty 1

## 2021-02-21 MED ORDER — LACTATED RINGERS IV SOLN: INTRAVENOUS | Status: AC

## 2021-02-21 MED ORDER — ACETAMINOPHEN 325 MG PO TABS: 650.0000 mg | ORAL_TABLET | Freq: Four times a day (QID) | ORAL | Status: DC | PRN

## 2021-02-21 MED ORDER — BRIMONIDINE TARTRATE 0.2 % OP SOLN
1.0000 [drp] | Freq: Two times a day (BID) | OPHTHALMIC | Status: DC
  Administered 2021-02-22 – 2021-02-24 (×5): 1 [drp] via OPHTHALMIC
  Filled 2021-02-21 (×2): qty 5

## 2021-02-21 MED ORDER — ATORVASTATIN CALCIUM 10 MG PO TABS
20.0000 mg | ORAL_TABLET | Freq: Every evening | ORAL | Status: DC
  Administered 2021-02-21 – 2021-02-23 (×3): 20 mg via ORAL
  Filled 2021-02-21 (×3): qty 2

## 2021-02-21 MED ORDER — DORZOLAMIDE HCL-TIMOLOL MAL 2-0.5 % OP SOLN
1.0000 [drp] | Freq: Two times a day (BID) | OPHTHALMIC | Status: DC
  Administered 2021-02-22 – 2021-02-24 (×6): 1 [drp] via OPHTHALMIC
  Filled 2021-02-21: qty 10

## 2021-02-21 MED ORDER — ONDANSETRON HCL 4 MG PO TABS: 4.0000 mg | ORAL_TABLET | Freq: Four times a day (QID) | ORAL | Status: DC | PRN

## 2021-02-21 MED ORDER — ONDANSETRON HCL 4 MG/2ML IJ SOLN: 4.0000 mg | Freq: Four times a day (QID) | INTRAMUSCULAR | Status: DC | PRN

## 2021-02-21 MED ORDER — GLUCERNA SHAKE PO LIQD
237.0000 mL | Freq: Three times a day (TID) | ORAL | Status: DC
  Administered 2021-02-21 – 2021-02-24 (×6): 237 mL via ORAL

## 2021-02-21 MED ORDER — SENNOSIDES-DOCUSATE SODIUM 8.6-50 MG PO TABS: 1.0000 | ORAL_TABLET | Freq: Every evening | ORAL | Status: DC | PRN

## 2021-02-21 MED ORDER — MEMANTINE HCL 10 MG PO TABS
10.0000 mg | ORAL_TABLET | Freq: Two times a day (BID) | ORAL | Status: DC
  Administered 2021-02-22 – 2021-02-24 (×6): 10 mg via ORAL
  Filled 2021-02-21 (×7): qty 1

## 2021-02-21 MED ORDER — METOPROLOL TARTRATE 25 MG PO TABS
25.0000 mg | ORAL_TABLET | Freq: Two times a day (BID) | ORAL | Status: DC
  Administered 2021-02-21 – 2021-02-24 (×6): 25 mg via ORAL
  Filled 2021-02-21 (×6): qty 1

## 2021-02-21 MED ORDER — ASPIRIN EC 81 MG PO TBEC
81.0000 mg | DELAYED_RELEASE_TABLET | Freq: Every day | ORAL | Status: DC
  Administered 2021-02-22 – 2021-02-24 (×3): 81 mg via ORAL
  Filled 2021-02-21 (×3): qty 1

## 2021-02-21 MED ORDER — SODIUM CHLORIDE 0.9 % IV SOLN
1.0000 g | INTRAVENOUS | Status: DC
  Administered 2021-02-22 – 2021-02-24 (×3): 1 g via INTRAVENOUS
  Filled 2021-02-21 (×3): qty 10

## 2021-02-21 MED ORDER — INSULIN ASPART 100 UNIT/ML IJ SOLN
0.0000 [IU] | Freq: Three times a day (TID) | INTRAMUSCULAR | Status: DC
  Administered 2021-02-22: 2 [IU] via SUBCUTANEOUS
  Administered 2021-02-22: 3 [IU] via SUBCUTANEOUS
  Administered 2021-02-22 – 2021-02-23 (×2): 2 [IU] via SUBCUTANEOUS

## 2021-02-21 NOTE — ED Notes (Signed)
Attempted to give report 

## 2021-02-21 NOTE — ED Notes (Signed)
Attempted in and out cath x 1, unsuccessful. Applied primo fit for urine collection.

## 2021-02-21 NOTE — ED Notes (Signed)
Introduced myself to pt and spouse. Pt alert, but oriented x2 only (person and situation). Pt soft and hard to understand. Turned off supplemental O2 because he only came on it from home. On RA now satting 100%. Spouse at bedside and said he has confusion at home, but not to this extent. GCS 14. Pt pulled up in bed and repositioned. Denies further needs.

## 2021-02-21 NOTE — ED Notes (Signed)
Pt given food/snack. Denies further needs.

## 2021-02-21 NOTE — ED Notes (Signed)
Waiting on transport. Pt laying on R side of bed (on railing), but pt yells at me when I try and reposition him.

## 2021-02-21 NOTE — ED Provider Notes (Signed)
[facility] EMERGENCY DEPARTMENT Provider Note   CSN: [id number] Arrival date & time: 02/21/21  1420  LEVEL 5 CAVEAT - DEMENTIA   History Chief Complaint  Patient presents with   Loss of Consciousness    Mike Mack is a 85 y.o. male.  HPI 85 year old male presents with loss of consciousness/altered mental status.  History is from the wife at the bedside.  Since yesterday he has been more confused than his typical baseline.  She noticed that he seemed to be talking to people that were not there like his brother.  This morning he ate breakfast like normal but at lunch at around 1:00 she noticed that it seemed like he was unresponsive.  His head had leaned over to 1 side and she could not wake him up despite light stimulation and calling his name.  This lasted 6 or 8 minutes.  Family encouraged her to call EMS.  No recent medication changes, fevers, or acute complaints from the patient.  He has not had vomiting.  Past Medical History:  Diagnosis Date   Anemia 05/13/2013   Arthritis    "right leg" (07/25/2013)   BPH (benign prostatic hyperplasia)    Constipation 05/13/2013   Exertional shortness of breath    "sometimes" (07/25/2013)   History of kidney stones    History of stomach ulcers    Hypertension    Neuropathy    Polio    Polio Childhood   Small bowel obstruction ([place])    Stroke ([place]) 2014   residual:  "left hand kind of numb" (07/25/2013)   Type II diabetes mellitus ([place])     Patient Active Problem List   Diagnosis Date Noted   Altered mental status 02/21/2021   CKD (chronic kidney disease) stage 3, GFR 30-59 ml/min ([place]) 02/21/2021   Dementia (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   Subphrenic abscess ([place]) 03/19/2020   Failure to thrive (child) 03/19/2020   Posterior vitreous detachment of both eyes 12/11/2019   Syncope 10/25/2014   AKI (acute kidney injury) ([place]) 10/25/2014   H/O: CVA (cerebrovascular accident)  10/25/2014   H/O poliomyelitis 10/25/2014   Type 2 diabetes mellitus ([place]) 10/25/2014   Tachycardia 08/29/2013   Cough 08/29/2013   Monoparesis of leg ([place]) 08/20/2013   Muscle weakness of lower extremity 08/20/2013   Right upper quadrant abdominal abscess ([place]) 08/15/2013   Hepatic abscess 08/15/2013   Subacute delirium 08/14/2013   Hyperglycemia 08/14/2013   Oral thrush 08/14/2013   Leukocytosis, unspecified 08/14/2013   Debility 08/14/2013   S/P cholecystectomy 07/29/2013   Cholecystitis with cholelithiasis 07/25/2013   Arthritis 06/06/2013   Headache(784.0) 06/06/2013   Headache 06/06/2013   Generalized weakness 05/24/2013   Cholecystitis, acute 05/24/2013   RUQ abdominal pain 05/13/2013   Acute cholecystitis: Probable 05/13/2013   Constipation 05/13/2013   Enterococcus UTI 05/13/2013   Leukocytosis 05/13/2013   Anemia 05/13/2013   Acute left thalamic CVA (cerebral infarction) 04/10/2012   Hypertension    BPH (benign prostatic hyperplasia)    Pruritic disorder 02/02/2011    Past Surgical History:  Procedure Laterality Date   BIOPSY  11/30/2020   Procedure: BIOPSY;  Surgeon: Ronnette Juniper, MD;  Location: [facility];  Service: Gastroenterology;;   BOWEL RESECTION     CATARACT EXTRACTION W/ INTRAOCULAR LENS  IMPLANT, BILATERAL Bilateral 2000's   CHOLECYSTECTOMY N/A 07/25/2013   Procedure: LAPAROSCOPIC CHOLECYSTECTOMY WITH INTRAOPERATIVE CHOLANGIOGRAM;  Surgeon: [name], MD;  Location: [facility];  Service: General;  Laterality: N/A;   COLON SURGERY     COLONOSCOPY     Hx: of   CYSTOSCOPY WITH BIOPSY N/A 12/01/2019   Procedure: CYSTOSCOPY WITH BLADDER BIOPSY AND FULGURATION;  Surgeon: Lucas Mallow, MD;  Location: [facility];  Service: Urology;  Laterality: N/A;   ESOPHAGOGASTRODUODENOSCOPY N/A 11/30/2020   Procedure: ESOPHAGOGASTRODUODENOSCOPY (EGD);  Surgeon: Ronnette Juniper, MD;  Location: [facility];  Service: Gastroenterology;  Laterality: N/A;   EXCISIONAL  HEMORRHOIDECTOMY  2000's   EXTRACORPOREAL SHOCK WAVE LITHOTRIPSY Right 10/03/2018   Procedure: EXTRACORPOREAL SHOCK WAVE LITHOTRIPSY (ESWL);  Surgeon: Festus Aloe, MD;  Location: [facility];  Service: Urology;  Laterality: Right;   EYE SURGERY     INGUINAL HERNIA REPAIR Right 1970's   IR RADIOLOGIST EVAL & MGMT  04/06/2020   LAPAROSCOPIC CHOLECYSTECTOMY  07/25/2013   w/LOA (07/25/2013)       Family History  Problem Relation Age of Onset   Heart failure Mother    Diabetes Sister    Diabetes Sister    Diabetes Brother    Hypertension Sister    Hypertension Sister    Hypertension Brother    Heart attack Sister     Social History   Tobacco Use   Smoking status: Never   Smokeless tobacco: Never  Vaping Use   Vaping Use: Never used  Substance Use Topics   Alcohol use: No   Drug use: No    Home Medications Prior to Admission medications   Medication Sig Start Date End Date Taking? Authorizing Provider  acetaminophen (TYLENOL) 325 MG tablet Take 2 tablets (650 mg total) by mouth every 6 (six) hours as needed for mild pain (or Fever >/= 101). 11/27/20   Debbe Odea, MD  aspirin EC 81 MG tablet Take 1 tablet (81 mg total) by mouth daily. 12/16/20   Lavina Hamman, MD  atorvastatin (LIPITOR) 20 MG tablet Take 20 mg by mouth every evening.  11/03/13   [provider]  B Complex Vitamins (B COMPLEX PO) Take 1 tablet by mouth at bedtime.    [provider]  brimonidine (ALPHAGAN) 0.2 % ophthalmic solution Place 1 drop into both eyes 2 (two) times daily. 12/28/19   [provider]  cholecalciferol (VITAMIN D3) 10 MCG (400 UNIT) TABS tablet Take 400 Units by mouth daily.    [provider]  dorzolamide-timolol (COSOPT) 22.3-6.8 MG/ML ophthalmic solution Place 1 drop into both eyes 2 (two) times daily. 03/04/20   [provider]  feeding supplement, GLUCERNA SHAKE, (GLUCERNA SHAKE) LIQD Take 237 mLs by mouth 3 (three) times daily between meals.  03/25/20   Antonieta Pert, MD  latanoprost (XALATAN) 0.005 % ophthalmic solution Place 1 drop into both eyes at bedtime.  11/17/14   [provider]  levothyroxine (SYNTHROID) 88 MCG tablet Take 88 mcg by mouth at bedtime.  04/21/11   [provider]  memantine (NAMENDA) 10 MG tablet Take 10 mg by mouth 2 (two) times daily. 11/04/20   [provider]  metoprolol tartrate (LOPRESSOR) 25 MG tablet Take 1 tablet (25 mg total) by mouth 2 (two) times daily. 12/09/20   Lavina Hamman, MD  Multiple Vitamin (MULTIVITAMIN WITH MINERALS) TABS tablet Take 1 tablet by mouth daily. 09/06/13   [name], MD  ondansetron (ZOFRAN ODT) 4 MG disintegrating tablet '4mg'$  ODT q4 hours prn nausea/vomit 08/24/19   Drenda Freeze, MD  pantoprazole (PROTONIX) 40 MG tablet Take 1 tablet (40 mg total) by mouth  2 (two) times daily before a meal. 12/09/20   Lavina Hamman, MD  polyethylene glycol (MIRALAX / GLYCOLAX) packet Take 17 g by mouth daily. 09/06/13   [name], MD  RHOPRESSA 0.02 % SOLN Place 1 drop into both eyes at bedtime. 03/05/20   [provider]  Tamsulosin HCl (FLOMAX) 0.4 MG CAPS Take 0.4 mg by mouth in the morning and at bedtime.    [provider]    Allergies    Metformin and related, Other, and Soliqua [insulin glargine-lixisenatide]  Review of Systems   Review of Systems  Unable to perform ROS: Dementia   Physical Exam Updated Vital Signs BP (!) 167/74 (BP Location: Left Arm)   Pulse 95   Temp 98 F (36.7 C) (Oral)   Resp 18   SpO2 99%   Physical Exam Vitals and nursing note reviewed.  Constitutional:      Appearance: He is well-developed. He is not ill-appearing or diaphoretic.  HENT:     Head: Normocephalic and atraumatic.     Right Ear: External ear normal.     Left Ear: External ear normal.     Nose: Nose normal.  Eyes:     General:        Right eye: No discharge.        Left eye: No discharge.     Extraocular Movements:  Extraocular movements intact.     Pupils: Pupils are equal, round, and reactive to light.  Cardiovascular:     Rate and Rhythm: Normal rate and regular rhythm.     Heart sounds: Normal heart sounds.  Pulmonary:     Effort: Pulmonary effort is normal.     Breath sounds: Normal breath sounds.  Abdominal:     Palpations: Abdomen is soft.     Tenderness: There is no abdominal tenderness.  Musculoskeletal:     Cervical back: Neck supple. No rigidity.     Comments: Right leg is chronically atrophied  Skin:    General: Skin is warm and dry.  Neurological:     Mental Status: He is alert. He is disoriented.     Comments: Mild right facial droop. He is awake and alert but disoriented. Knows himself and place. Equal strength in BUE. Mild weakness/atrophy in RLE. LLE with good strength  Psychiatric:        Mood and Affect: Mood is not anxious.    ED Results / Procedures / Treatments   Labs (all labs ordered are listed, but only abnormal results are displayed) Labs Reviewed  COMPREHENSIVE METABOLIC PANEL - Abnormal; Notable for the following components:      Result Value   Glucose, Bld 226 (*)    Creatinine, Ser 1.50 (*)    Total Protein 8.4 (*)    Albumin 3.4 (*)    Alkaline Phosphatase 139 (*)    GFR, Estimated 46 (*)    All other components within normal limits  CBC WITH DIFFERENTIAL/PLATELET - Abnormal; Notable for the following components:   RDW 15.9 (*)    All other components within normal limits  URINALYSIS, COMPLETE (UACMP) WITH MICROSCOPIC - Abnormal; Notable for the following components:   Color, Urine AMBER (*)    APPearance CLOUDY (*)    Hgb urine dipstick MODERATE (*)    Protein, ur 100 (*)    Leukocytes,Ua LARGE (*)    RBC / HPF >50 (*)    WBC, UA >50 (*)    All other components within normal limits  AMMONIA - Abnormal; Notable for the following components:   Ammonia <9 (*)    All other components within normal limits  CBG MONITORING, ED - Abnormal; Notable for  the following components:   Glucose-Capillary 183 (*)    All other components within normal limits  I-STAT VENOUS BLOOD GAS, ED - Abnormal; Notable for the following components:   pCO2, Ven 62.3 (*)    pO2, Ven 46.0 (*)    Bicarbonate 29.7 (*)    All other components within normal limits  I-STAT ARTERIAL BLOOD GAS, ED - Abnormal; Notable for the following components:   pO2, Arterial 118 (*)    HCT 36.0 (*)    Hemoglobin 12.2 (*)    All other components within normal limits  CBG MONITORING, ED - Abnormal; Notable for the following components:   Glucose-Capillary 174 (*)    All other components within normal limits  SARS CORONAVIRUS 2 (TAT 6-24 HRS)  URINE CULTURE  BASIC METABOLIC PANEL  CBC    EKG EKG Interpretation  Date/Time:  Monday February 21 2021 14:33:27 EDT Ventricular Rate:  81 PR Interval:  53 QRS Duration: 92 QT Interval:  406 QTC Calculation: 472 R Axis:   15 Text Interpretation: Sinus rhythm Short PR interval Probable left atrial enlargement Anteroseptal infarct, age indeterminate Axis normal Confirmed by Lorre Munroe ([id number]) on 02/21/2021 2:35:46 PM  Radiology CT HEAD WO CONTRAST  Result Date: 02/21/2021 CLINICAL DATA:  Mental status change lethargy EXAM: CT HEAD WITHOUT CONTRAST TECHNIQUE: Contiguous axial images were obtained from the base of the skull through the vertex without intravenous contrast. COMPARISON:  MRI 11/25/2020, CT brain 03/20/2019 FINDINGS: Brain: No acute territorial infarction, hemorrhage, or intracranial mass is visualized. Atrophy with chronic small-vessel ischemic changes of the white matter. Chronic cerebellar infarcts. Stable ventricle size. Vascular: No hyperdense vessels.  Carotid vascular calcification Skull: Fluid in the mastoids.  No fracture Sinuses/Orbits: No acute finding. Other: None IMPRESSION: 1. No CT evidence for acute intracranial abnormality. 2. Atrophy and chronic small vessel ischemic changes of the white matter. Chronic  cerebellar infarcts Electronically Signed   By: Donavan Foil M.D.   On: 02/21/2021 16:10   DG Chest Port 1 View  Result Date: 02/21/2021 CLINICAL DATA:  Altered mental status, loss of consciousness EXAM: PORTABLE CHEST 1 VIEW COMPARISON:  Portable exam 1621 hours compared to 12/03/2020 FINDINGS: Normal heart size, mediastinal contours, and pulmonary vascularity. Minimal bibasilar atelectasis. Lungs otherwise clear. No infiltrate, pleural effusion, or pneumothorax. Osseous structures unremarkable. IMPRESSION: Minimal bibasilar atelectasis. Electronically Signed   By: Lavonia Dana M.D.   On: 02/21/2021 16:38    Procedures Procedures   Medications Ordered in ED Medications  enoxaparin (LOVENOX) injection 30 mg (has no administration in time range)  acetaminophen (TYLENOL) tablet 650 mg (has no administration in time range)    Or  acetaminophen (TYLENOL) suppository 650 mg (has no administration in time range)  lactated ringers infusion (has no administration in time range)  ondansetron (ZOFRAN) tablet 4 mg (has no administration in time range)    Or  ondansetron (ZOFRAN) injection 4 mg (has no administration in time range)  senna-docusate (Senokot-S) tablet 1 tablet (has no administration in time range)  cefTRIAXone (ROCEPHIN) 1 g in sodium chloride 0.9 % 100 mL IVPB (has no administration in time range)  aspirin EC tablet 81 mg (has no administration in time range)  atorvastatin (LIPITOR) tablet 20 mg (has no administration in time range)  brimonidine (ALPHAGAN) 0.2 % ophthalmic solution 1 drop (  has no administration in time range)  dorzolamide-timolol (COSOPT) 22.3-6.8 MG/ML ophthalmic solution 1 drop (has no administration in time range)  feeding supplement (GLUCERNA SHAKE) (GLUCERNA SHAKE) liquid 237 mL (237 mLs Oral Given 02/21/21 2000)  latanoprost (XALATAN) 0.005 % ophthalmic solution 1 drop (has no administration in time range)  levothyroxine (SYNTHROID) tablet 88 mcg (has no  administration in time range)  memantine (NAMENDA) tablet 10 mg (has no administration in time range)  polyethylene glycol (MIRALAX / GLYCOLAX) packet 17 g (has no administration in time range)  Netarsudil Dimesylate 0.02 % SOLN 1 drop (has no administration in time range)  metoprolol tartrate (LOPRESSOR) tablet 25 mg (has no administration in time range)  pantoprazole (PROTONIX) EC tablet 40 mg (has no administration in time range)  tamsulosin (FLOMAX) capsule 0.4 mg (has no administration in time range)  insulin aspart (novoLOG) injection 0-9 Units (has no administration in time range)  sodium chloride 0.9 % bolus 1,000 mL (0 mLs Intravenous Stopped 02/21/21 1756)  cefTRIAXone (ROCEPHIN) 1 g in sodium chloride 0.9 % 100 mL IVPB (0 g Intravenous Stopped 02/21/21 1831)    ED Course  I have reviewed the triage vital signs and the nursing notes.  Pertinent labs & imaging results that were available during my care of the patient were reviewed by me and considered in my medical decision making (see chart for details).    MDM Rules/Calculators/A&P                           Patient seems to be acutely altered from baseline which appears to be due to an acute urinary tract infection.  There was questionable acidosis on his VBG so an ABG was obtained but is unremarkable.  At this point, given the acute altered mental state I think it would be best to keep him for IV antibiotics to ensure improvement.  Dr. Posey Pronto to admit. Final Clinical Impression(s) / ED Diagnoses Final diagnoses:  Acute UTI    Rx / DC Orders ED Discharge Orders     None        Sherwood Gambler, MD 02/21/21 2322

## 2021-02-21 NOTE — ED Triage Notes (Signed)
BIB EMS for syncopal event. LOC while sitting on the wheelchair, probably 10 minutes per report. Pt has dementia and is wheelchair bound at baseline. Pt is now awake but more lethargic than normal.

## 2021-02-21 NOTE — ED Notes (Signed)
Patient transported to CT 

## 2021-02-21 NOTE — ED Notes (Signed)
EDP at bedside  

## 2021-02-21 NOTE — ED Notes (Signed)
Floor said they will take report in 5 minutes.

## 2021-02-21 NOTE — H&P (Signed)
History and Physical    Mike Mack ERD:[id number] DOB: 1935/08/05 DOA: 02/21/2021  PCP: Jani Gravel, MD  Patient coming from: Home  I have personally briefly reviewed patient's old medical records in [place]  Chief Complaint: Altered mental status  HPI: Mike Mack is a 85 y.o. 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  History is limited from patient due to dementia and is otherwise supplemented by EDP, chart review, and patient's wife at bedside.  Patient has dementia with memory issues at baseline.  He is wheelchair-bound due to prior stroke and history of childhood polio resulting in chronic right leg weakness and atrophy.  Patient's wife states that over the last 2 days he has seemed more confused than his baseline.  He seemed to be speaking to and seeing people who were not actually present.  After breakfast earlier today his wife noticed that he appeared to have slumped over and minimally responsive.  He was leaning over to his left side and she cannot wake him by calling his name or light stimulation.  This lasted 6 to 8 minutes and EMS was subsequently called.  EMS were able to awaken him some more but he still appeared lethargic prior to arrival.  Patient is now awake, alert, answering questions and following commands.  He does report recent dysuria.  He denies any chest pain, dyspnea, abdominal pain, or loose stools.  He is oriented to self.  He states that he is currently at home in his living room and that he is accompanied by his brothers and sisters (only wife is present).  His wife states that he appears to be improving but not quite back at his typical baseline.  ED Course:  Initial vitals show BP 142/75, pulse 83, RR 13, temp 97.1 F, SPO2 97%  on room air.  Labs show WBC 9.6, hemoglobin 13.6, platelets 264,000, sodium 135, potassium 4.4, bicarb 28, BUN 19, creatinine 1.50, serum glucose 226, AST 20, ALT 18, alk phos 139, total bilirubin 0.7, ammonia <9.  ABG shows pH 7.360, PCO2 43.7, PO2 118.  Urinalysis shows 100 protein, negative nitrates, large leukocytes, >50 RBCs and WBCs/hpf, no bacteria on microscopy.  Urine culture was collected and pending.  SARS-CoV-2 PCR in process.  CT head without contrast is negative for acute intracranial normality.  Atrophy and chronic small vessel ischemic changes of white matter and chronic cerebellar infarcts noted.  Portable chest x-ray shows minimal bibasilar atelectasis.  Patient was given 1 L normal saline and IV ceftriaxone.  The hospitalist service was consulted to admit for further evaluation and management.  Review of Systems:  All systems reviewed and are negative except as documented in history of present illness above.   Past Medical History:  Diagnosis Date   Anemia 05/13/2013   Arthritis    "right leg" (07/25/2013)   BPH (benign prostatic hyperplasia)    Constipation 05/13/2013   Exertional shortness of breath    "sometimes" (07/25/2013)   History of kidney stones    History of stomach ulcers    Hypertension    Neuropathy    Polio    Polio Childhood   Small bowel obstruction ([place])    Stroke ([place]) 2014   residual:  "left hand kind of numb" (07/25/2013)   Type II diabetes mellitus ([place])  Past Surgical History:  Procedure Laterality Date   BIOPSY  11/30/2020   Procedure: BIOPSY;  Surgeon: Ronnette Juniper, MD;  Location: [place];  Service: Gastroenterology;;   BOWEL RESECTION     CATARACT EXTRACTION W/ INTRAOCULAR LENS  IMPLANT, BILATERAL Bilateral 2000's   CHOLECYSTECTOMY N/A 07/25/2013   Procedure: LAPAROSCOPIC CHOLECYSTECTOMY WITH INTRAOPERATIVE CHOLANGIOGRAM;  Surgeon: [name], MD;  Location: [place];  Service: General;  Laterality: N/A;   COLON SURGERY      COLONOSCOPY     Hx: of   CYSTOSCOPY WITH BIOPSY N/A 12/01/2019   Procedure: CYSTOSCOPY WITH BLADDER BIOPSY AND FULGURATION;  Surgeon: Lucas Mallow, MD;  Location: [facility];  Service: Urology;  Laterality: N/A;   ESOPHAGOGASTRODUODENOSCOPY N/A 11/30/2020   Procedure: ESOPHAGOGASTRODUODENOSCOPY (EGD);  Surgeon: Ronnette Juniper, MD;  Location: [place];  Service: Gastroenterology;  Laterality: N/A;   EXCISIONAL HEMORRHOIDECTOMY  2000's   EXTRACORPOREAL SHOCK WAVE LITHOTRIPSY Right 10/03/2018   Procedure: EXTRACORPOREAL SHOCK WAVE LITHOTRIPSY (ESWL);  Surgeon: Festus Aloe, MD;  Location: [facility];  Service: Urology;  Laterality: Right;   EYE SURGERY     INGUINAL HERNIA REPAIR Right 1970's   IR RADIOLOGIST EVAL & MGMT  04/06/2020   LAPAROSCOPIC CHOLECYSTECTOMY  07/25/2013   w/LOA (07/25/2013)    Social History:  reports that he has never smoked. He has never used smokeless tobacco. He reports that he does not drink alcohol and does not use drugs.  Allergies  Allergen Reactions   Metformin And Related Other (See Comments)    Gi intolerance   Other     Other reaction(s): Other (See Comments) Renal insufficiency Other reaction(s): GI Upset (intolerance) Gi intolerance   [name] [Insulin Glargine-Lixisenatide] Other (See Comments)    Stomach cramps    Family History  Problem Relation Age of Onset   Heart failure Mother    Diabetes Sister    Diabetes Sister    Diabetes Brother    Hypertension Sister    Hypertension Sister    Hypertension Brother    Heart attack Sister      Prior to Admission medications   Medication Sig Start Date End Date Taking? Authorizing Provider  acetaminophen (TYLENOL) 325 MG tablet Take 2 tablets (650 mg total) by mouth every 6 (six) hours as needed for mild pain (or Fever >/= 101). 11/27/20   Debbe Odea, MD  aspirin EC 81 MG tablet Take 1 tablet (81 mg total) by mouth daily. 12/16/20   Lavina Hamman, MD  atorvastatin (LIPITOR) 20 MG tablet Take 20  mg by mouth every evening.  11/03/13   [provider]  B Complex Vitamins (B COMPLEX PO) Take 1 tablet by mouth at bedtime.    [provider]  brimonidine (ALPHAGAN) 0.2 % ophthalmic solution Place 1 drop into both eyes 2 (two) times daily. 12/28/19   [provider]  cholecalciferol (VITAMIN D3) 10 MCG (400 UNIT) TABS tablet Take 400 Units by mouth daily.    [provider]  dorzolamide-timolol (COSOPT) 22.3-6.8 MG/ML ophthalmic solution Place 1 drop into both eyes 2 (two) times daily. 03/04/20   [provider]  feeding supplement, GLUCERNA SHAKE, (GLUCERNA SHAKE) LIQD Take 237 mLs by mouth 3 (three) times daily between meals. 03/25/20   Antonieta Pert, MD  latanoprost (XALATAN) 0.005 % ophthalmic solution Place 1 drop into both eyes at bedtime.  11/17/14   [provider]  levothyroxine (SYNTHROID) 88 MCG tablet Take 88 mcg by mouth at bedtime.  04/21/11  [provider]  memantine (NAMENDA) 10 MG tablet Take 10 mg by mouth 2 (two) times daily. 11/04/20   [provider]  metoprolol tartrate (LOPRESSOR) 25 MG tablet Take 1 tablet (25 mg total) by mouth 2 (two) times daily. 12/09/20   Lavina Hamman, MD  Multiple Vitamin (MULTIVITAMIN WITH MINERALS) TABS tablet Take 1 tablet by mouth daily. 09/06/13   [name], MD  ondansetron (ZOFRAN ODT) 4 MG disintegrating tablet 56m ODT q4 hours prn nausea/vomit 08/24/19   YDrenda Freeze MD  pantoprazole (PROTONIX) 40 MG tablet Take 1 tablet (40 mg total) by mouth 2 (two) times daily before a meal. 12/09/20   PLavina Hamman MD  polyethylene glycol (MIRALAX / GFloria Raveling packet Take 17 g by mouth daily. 09/06/13   SDelfina Redwood MD  RHOPRESSA 0.02 % SOLN Place 1 drop into both eyes at bedtime. 03/05/20   [provider]  Tamsulosin HCl (FLOMAX) 0.4 MG CAPS Take 0.4 mg by mouth in the morning and at bedtime.    [provider]    Physical Exam: Vitals:   02/21/21  1433 02/21/21 1500 02/21/21 1502 02/21/21 1619  BP:  (!) 142/75    Pulse:  84    Resp:  13    Temp:   (!) 97.1 F (36.2 C) 98.3 F (36.8 C)  TempSrc:   Oral Rectal  SpO2: 97% 97%     Constitutional: Elderly man resting supine in bed, NAD, calm, comfortable Eyes: PERRL, lids and conjunctivae normal ENMT: Mucous membranes are moist. Posterior pharynx clear of any exudate or lesions.Normal dentition.  Neck: normal, supple, no masses. Respiratory: clear to auscultation bilaterally, no wheezing, no crackles. Normal respiratory effort. No accessory muscle use.  Cardiovascular: Regular rate and rhythm, no murmurs / rubs / gallops. No extremity edema. 2+ pedal pulses. Abdomen: no tenderness, no masses palpated. No hepatosplenomegaly. Bowel sounds positive.  Musculoskeletal: no clubbing / cyanosis. No joint deformity upper and lower extremities. Good ROM bilateral upper and left lower extremities.  Atrophied right lower extremity with diminished ROM. Skin: no rashes, lesions, ulcers. No induration Neurologic: CN 2-12 grossly intact. Sensation intact. Strength 5/5 in bilateral upper and left lower extremity.  Strength 2/5 RLE. Psychiatric: Awake, alert, oriented to self.  Not oriented to place, year, situation.  Appears to be seeing people who are not present (states that brothers and sisters are present when only wife is in the room).  Labs on Admission: I have personally reviewed following labs and imaging studies  CBC: Recent Labs  Lab 02/21/21 1542 02/21/21 1611 02/21/21 1753  WBC 9.6  --   --   NEUTROABS 7.5  --   --   HGB 13.6 15.3 12.2*  HCT 43.0 45.0 36.0*  MCV 93.9  --   --   PLT 264  --   --    Basic Metabolic Panel: Recent Labs  Lab 02/21/21 1542 02/21/21 1611 02/21/21 1753  NA 135 141 141  K 4.4 4.5 4.1  CL 98  --   --   CO2 28  --   --   GLUCOSE 226*  --   --   BUN 19  --   --   CREATININE 1.50*  --   --   CALCIUM 9.7  --   --    GFR: CrCl cannot be calculated  (Unknown ideal weight.). Liver Function Tests: Recent Labs  Lab 02/21/21 1542  AST 20  ALT 18  ALKPHOS 139*  BILITOT 0.7  PROT 8.4*  ALBUMIN 3.4*   No results for input(s): LIPASE, AMYLASE in the last 168 hours. Recent Labs  Lab 02/21/21 1542  AMMONIA <9*   Coagulation Profile: No results for input(s): INR, PROTIME in the last 168 hours. Cardiac Enzymes: No results for input(s): CKTOTAL, CKMB, CKMBINDEX, TROPONINI in the last 168 hours. BNP (last 3 results) No results for input(s): PROBNP in the last 8760 hours. HbA1C: No results for input(s): HGBA1C in the last 72 hours. CBG: Recent Labs  Lab 02/21/21 1444  GLUCAP 183*   Lipid Profile: No results for input(s): CHOL, HDL, LDLCALC, TRIG, CHOLHDL, LDLDIRECT in the last 72 hours. Thyroid Function Tests: No results for input(s): TSH, T4TOTAL, FREET4, T3FREE, THYROIDAB in the last 72 hours. Anemia Panel: No results for input(s): VITAMINB12, FOLATE, FERRITIN, TIBC, IRON, RETICCTPCT in the last 72 hours. Urine analysis:    Component Value Date/Time   COLORURINE AMBER (A) 02/21/2021 1515   APPEARANCEUR CLOUDY (A) 02/21/2021 1515   LABSPEC 1.018 02/21/2021 1515   PHURINE 7.0 02/21/2021 1515   GLUCOSEU NEGATIVE 02/21/2021 1515   HGBUR MODERATE (A) 02/21/2021 1515   BILIRUBINUR NEGATIVE 02/21/2021 1515   KETONESUR NEGATIVE 02/21/2021 1515   PROTEINUR 100 (A) 02/21/2021 1515   UROBILINOGEN 0.2 10/25/2014 1520   NITRITE NEGATIVE 02/21/2021 1515   LEUKOCYTESUR LARGE (A) 02/21/2021 1515    Radiological Exams on Admission: CT HEAD WO CONTRAST  Result Date: 02/21/2021 CLINICAL DATA:  Mental status change lethargy EXAM: CT HEAD WITHOUT CONTRAST TECHNIQUE: Contiguous axial images were obtained from the base of the skull through the vertex without intravenous contrast. COMPARISON:  MRI 11/25/2020, CT brain 03/20/2019 FINDINGS: Brain: No acute territorial infarction, hemorrhage, or intracranial mass is visualized. Atrophy with  chronic small-vessel ischemic changes of the white matter. Chronic cerebellar infarcts. Stable ventricle size. Vascular: No hyperdense vessels.  Carotid vascular calcification Skull: Fluid in the mastoids.  No fracture Sinuses/Orbits: No acute finding. Other: None IMPRESSION: 1. No CT evidence for acute intracranial abnormality. 2. Atrophy and chronic small vessel ischemic changes of the white matter. Chronic cerebellar infarcts Electronically Signed   By: Donavan Foil M.D.   On: 02/21/2021 16:10   DG Chest Port 1 View  Result Date: 02/21/2021 CLINICAL DATA:  Altered mental status, loss of consciousness EXAM: PORTABLE CHEST 1 VIEW COMPARISON:  Portable exam 1621 hours compared to 12/03/2020 FINDINGS: Normal heart size, mediastinal contours, and pulmonary vascularity. Minimal bibasilar atelectasis. Lungs otherwise clear. No infiltrate, pleural effusion, or pneumothorax. Osseous structures unremarkable. IMPRESSION: Minimal bibasilar atelectasis. Electronically Signed   By: Lavonia Dana M.D.   On: 02/21/2021 16:38    EKG: Personally reviewed. Sinus rhythm, no acute ischemic changes, motion artifact.  PACs no longer present when compared to prior.  Assessment/Plan Principal Problem:   Altered mental status Active Problems:   BPH (benign prostatic hyperplasia)   H/O: CVA (cerebrovascular accident)   Type 2 diabetes mellitus (HCC)   Dementia (HCC)   CKD (chronic kidney disease) stage 3, GFR 30-59 ml/min (HCC)   Mike Mack is a 85 y.o. 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  Acute metabolic encephalopathy Abnormal urinalysis: Patient with new change from baseline with decreased level of interaction and apparent hallucinations.  He does report dysuria but he has 5 UTI.  Also appears  somewhat dehydrated on admission. -Continue IV  ceftriaxone -Follow urine culture -Continue IV fluid hydration overnight  Dementia: Chronic with acute mental status change as above.  High risk for delirium. -Continue Namenda and delirium precautions  History of CVA: Continue aspirin and atorvastatin.  Type 2 diabetes: Taken off insulin on last admission.  Monitor CBGs with SSI.  CKD stage IIIa: Renal function slightly diminished from baseline.  Continue IV fluid hydration as above and follow.  Hypothyroidism: Continue Synthroid.  BPH: Continue Flomax.  Post polio syndrome with chronic right leg weakness/atrophy: Chronically wheelchair-bound.  DVT prophylaxis: Lovenox Code Status: DNR, confirmed with patient's wife at bedside Family Communication: Discussed with patient's wife at bedside Disposition Plan: From home and likely return to home pending clinical progress Consults called: None Level of care: Med-Surg Admission status:  Status is: Observation  The patient remains OBS appropriate and will d/c before 2 midnights.  Dispo: The patient is from: Home              Anticipated d/c is to: Home              Patient currently is not medically stable to d/c.   Zada Finders MD [facility]  If 7PM-7AM, please contact night-coverage [email or web address]  02/21/2021, 6:35 PM

## 2021-02-21 NOTE — ED Notes (Signed)
Admitting team at bedside.

## 2021-02-21 NOTE — ED Notes (Signed)
Pt leaving floor now.

## 2021-02-22 DIAGNOSIS — E039 Hypothyroidism, unspecified: Secondary | ICD-10-CM | POA: Diagnosis present

## 2021-02-22 DIAGNOSIS — E440 Moderate protein-calorie malnutrition: Secondary | ICD-10-CM | POA: Diagnosis present

## 2021-02-22 DIAGNOSIS — R4182 Altered mental status, unspecified: Secondary | ICD-10-CM | POA: Diagnosis not present

## 2021-02-22 DIAGNOSIS — E1122 Type 2 diabetes mellitus with diabetic chronic kidney disease: Secondary | ICD-10-CM | POA: Diagnosis present

## 2021-02-22 DIAGNOSIS — G9341 Metabolic encephalopathy: Secondary | ICD-10-CM | POA: Diagnosis present

## 2021-02-22 DIAGNOSIS — Z6827 Body mass index (BMI) 27.0-27.9, adult: Secondary | ICD-10-CM | POA: Diagnosis not present

## 2021-02-22 DIAGNOSIS — Z8673 Personal history of transient ischemic attack (TIA), and cerebral infarction without residual deficits: Secondary | ICD-10-CM | POA: Diagnosis not present

## 2021-02-22 DIAGNOSIS — E860 Dehydration: Secondary | ICD-10-CM | POA: Diagnosis present

## 2021-02-22 DIAGNOSIS — N1831 Chronic kidney disease, stage 3a: Secondary | ICD-10-CM | POA: Diagnosis present

## 2021-02-22 DIAGNOSIS — F0390 Unspecified dementia without behavioral disturbance: Secondary | ICD-10-CM | POA: Diagnosis present

## 2021-02-22 DIAGNOSIS — Z8249 Family history of ischemic heart disease and other diseases of the circulatory system: Secondary | ICD-10-CM | POA: Diagnosis not present

## 2021-02-22 DIAGNOSIS — Z79890 Hormone replacement therapy (postmenopausal): Secondary | ICD-10-CM | POA: Diagnosis not present

## 2021-02-22 DIAGNOSIS — R443 Hallucinations, unspecified: Secondary | ICD-10-CM | POA: Diagnosis present

## 2021-02-22 DIAGNOSIS — N400 Enlarged prostate without lower urinary tract symptoms: Secondary | ICD-10-CM | POA: Diagnosis present

## 2021-02-22 DIAGNOSIS — N390 Urinary tract infection, site not specified: Secondary | ICD-10-CM | POA: Diagnosis present

## 2021-02-22 DIAGNOSIS — Z7982 Long term (current) use of aspirin: Secondary | ICD-10-CM | POA: Diagnosis not present

## 2021-02-22 DIAGNOSIS — Z79899 Other long term (current) drug therapy: Secondary | ICD-10-CM | POA: Diagnosis not present

## 2021-02-22 DIAGNOSIS — R627 Adult failure to thrive: Secondary | ICD-10-CM | POA: Diagnosis present

## 2021-02-22 DIAGNOSIS — I129 Hypertensive chronic kidney disease with stage 1 through stage 4 chronic kidney disease, or unspecified chronic kidney disease: Secondary | ICD-10-CM | POA: Diagnosis present

## 2021-02-22 DIAGNOSIS — Z888 Allergy status to other drugs, medicaments and biological substances status: Secondary | ICD-10-CM | POA: Diagnosis not present

## 2021-02-22 DIAGNOSIS — Z993 Dependence on wheelchair: Secondary | ICD-10-CM | POA: Diagnosis not present

## 2021-02-22 DIAGNOSIS — E113593 Type 2 diabetes mellitus with proliferative diabetic retinopathy without macular edema, bilateral: Secondary | ICD-10-CM | POA: Diagnosis present

## 2021-02-22 DIAGNOSIS — Z20822 Contact with and (suspected) exposure to covid-19: Secondary | ICD-10-CM | POA: Diagnosis present

## 2021-02-22 DIAGNOSIS — Z833 Family history of diabetes mellitus: Secondary | ICD-10-CM | POA: Diagnosis not present

## 2021-02-22 DIAGNOSIS — Z66 Do not resuscitate: Secondary | ICD-10-CM | POA: Diagnosis present

## 2021-02-22 LAB — BASIC METABOLIC PANEL
Anion gap: 5 (ref 5–15)
BUN: 19 mg/dL (ref 8–23)
CO2: 25 mmol/L (ref 22–32)
Calcium: 9.3 mg/dL (ref 8.9–10.3)
Chloride: 106 mmol/L (ref 98–111)
Creatinine, Ser: 1.34 mg/dL — ABNORMAL HIGH (ref 0.61–1.24)
GFR, Estimated: 53 mL/min — ABNORMAL LOW (ref >60)
Glucose, Bld: 275 mg/dL — ABNORMAL HIGH (ref 70–99)
Potassium: 4.4 mmol/L (ref 3.5–5.1)
Sodium: 136 mmol/L (ref 135–145)

## 2021-02-22 LAB — CBC
HCT: 40.1 % (ref 39.0–52.0)
Hemoglobin: 12.5 g/dL — ABNORMAL LOW (ref 13.0–17.0)
MCH: 28.6 pg (ref 26.0–34.0)
MCHC: 31.2 g/dL (ref 30.0–36.0)
MCV: 91.8 fL (ref 80.0–100.0)
Platelets: 242 10*3/uL (ref 150–400)
RBC: 4.37 MIL/uL (ref 4.22–5.81)
RDW: 15.7 % — ABNORMAL HIGH (ref 11.5–15.5)
WBC: 9.1 10*3/uL (ref 4.0–10.5)
nRBC: 0 % (ref 0.0–0.2)

## 2021-02-22 LAB — GLUCOSE, CAPILLARY
Glucose-Capillary: 185 mg/dL — ABNORMAL HIGH (ref 70–99)
Glucose-Capillary: 215 mg/dL — ABNORMAL HIGH (ref 70–99)
Glucose-Capillary: 164 mg/dL — ABNORMAL HIGH (ref 70–99)
Glucose-Capillary: 130 mg/dL — ABNORMAL HIGH (ref 70–99)

## 2021-02-22 MED ORDER — LACTATED RINGERS IV SOLN: INTRAVENOUS | Status: DC

## 2021-02-22 NOTE — Progress Notes (Signed)
PROGRESS NOTE   Mike Mack  [id number] DOB: Dec 22, 1935 DOA: 02/21/2021 PCP: Jani Gravel, MD  Brief Narrative:  85 year old black male chronically wheelchair-bound Prior upper GI bleed 12/06/2020 for ulcers in the stomach duodenum hospitalization complicated by Enterobacter E. coli UTI, CKD 3, BPH, DM TY 2 with proliferative retinopathy of both eyes, childhood polio with right lower extremity atrophy + right sided CVA + postinfarct dementia, hypothyroid  Presented [facility] ED with confusion more than his baseline-seeing and talking to people not present-slumped over and was minimally responsive at home-became more alert  Work-up revealed abnormal urinalysis with large leukocytes BUN/creatinine 19/1.5, white count 9.6--patient did not appear to be septic on admission CT head with contrast negative for acute abnormality chest x-ray minimal atelectasis  Hospital-Problem based course  Underlying BPH probable UTI 9/5--Follow urine culture blood culture performed 9/5, continue ceftriaxone, continue LR 75 cc/H for now, continue Flomax 0.4 twice daily  DM TY 2 proliferative retinopathy--CBG ranging 185-275 and is being covered by ice sensitive sliding scale, at home is apparently not on any meds--initiate Lantus 5 units may need to have discussion about this in the outpatient--continue all eyedrops including latanoprost Cosopt Alphagan and netarsudil  Prior upper GI bleed 12/06/2020 with stomach ulcers--continue Protonix 40 twice daily  Childhood polio right lower extremity atrophy, right-sided CVA and postinfarct dementia--continue Namenda 10 twice daily, mobilizes in wheelchair typically    Hypertension---continue metoprolol 25 twice daily  hypothyroid--continue Synthroid 88 mcg daily Hx   DVT prophylaxis: Lovenox Code Status: DNR Family Communication: Discussed with patient's wife at the bedside Reeder Proa phone number [phone] Disposition:  Status is: Observation  The  patient will require care spanning > 2 midnights and should be moved to inpatient because: Hemodynamically unstable, Persistent severe electrolyte disturbances, and Unsafe d/c plan  Dispo: The patient is from: Home              Anticipated d/c is to:  Unclear at this timeGet therapy evaluation              Patient currently is not medically stable to d/c.   Difficult to place patient No       Consultants:     None as yet  Procedures: None  Antimicrobials: Ceftriaxone   Subjective: Awake alert but cannot tell me yea--also thinks he is at [facility] and not [facility] wife is at bedside saying that he has had increasing hallucinations recently -This is not a chronic issue  we discussed that this could be UTI but it could also be progression of dementia if we do not find a source for infection   Objective: Vitals:   02/21/21 2036 02/21/21 2215 02/22/21 0005 02/22/21 0418  BP: (!) 156/82 (!) 167/74 (!) 161/85 131/74  Pulse: 98 95 98 88  Resp: '16 18 18 18  '$ Temp: 98.1 F (36.7 C) 98 F (36.7 C) 98 F (36.7 C) 98.2 F (36.8 C)  TempSrc: Oral Oral Oral Oral  SpO2: 96% 99% 100% 100%    Intake/Output Summary (Last 24 hours) at 02/22/2021 0748 Last data filed at 02/22/2021 0600 Gross per 24 hour  Intake 1871.15 ml  Output 200 ml  Net 1671.15 ml   There were no vitals filed for this visit.  Examination:  Awake coherent no distress  EOMI NCAT no focal deficit CTA B no added sound no rales no rhonchi ROM intact to right lower extremity which is significantly weaker from his polio S1-S2 no murmur no rub no gallop Psych  is calm and collected   Data Reviewed: personally reviewed   CBC    Component Value Date/Time   WBC 9.1 02/22/2021 0116   RBC 4.37 02/22/2021 0116   HGB 12.5 (L) 02/22/2021 0116   HCT 40.1 02/22/2021 0116   PLT 242 02/22/2021 0116   MCV 91.8 02/22/2021 0116   MCH 28.6 02/22/2021 0116   MCHC 31.2 02/22/2021 0116   RDW 15.7 (H) 02/22/2021 0116    LYMPHSABS 1.5 02/21/2021 1542   MONOABS 0.6 02/21/2021 1542   EOSABS 0.0 02/21/2021 1542   BASOSABS 0.0 02/21/2021 1542   CMP Latest Ref Rng & Units 02/22/2021 02/21/2021 02/21/2021  Glucose 70 - 99 mg/dL 275(H) - -  BUN 8 - 23 mg/dL 19 - -  Creatinine 0.61 - 1.24 mg/dL 1.34(H) - -  Sodium 135 - 145 mmol/L 136 141 141  Potassium 3.5 - 5.1 mmol/L 4.4 4.1 4.5  Chloride 98 - 111 mmol/L 106 - -  CO2 22 - 32 mmol/L 25 - -  Calcium 8.9 - 10.3 mg/dL 9.3 - -  Total Protein 6.5 - 8.1 g/dL - - -  Total Bilirubin 0.3 - 1.2 mg/dL - - -  Alkaline Phos 38 - 126 U/L - - -  AST 15 - 41 U/L - - -  ALT 0 - 44 U/L - - -     Radiology Studies: CT HEAD WO CONTRAST  Result Date: 02/21/2021 CLINICAL DATA:  Mental status change lethargy EXAM: CT HEAD WITHOUT CONTRAST TECHNIQUE: Contiguous axial images were obtained from the base of the skull through the vertex without intravenous contrast. COMPARISON:  MRI 11/25/2020, CT brain 03/20/2019 FINDINGS: Brain: No acute territorial infarction, hemorrhage, or intracranial mass is visualized. Atrophy with chronic small-vessel ischemic changes of the white matter. Chronic cerebellar infarcts. Stable ventricle size. Vascular: No hyperdense vessels.  Carotid vascular calcification Skull: Fluid in the mastoids.  No fracture Sinuses/Orbits: No acute finding. Other: None IMPRESSION: 1. No CT evidence for acute intracranial abnormality. 2. Atrophy and chronic small vessel ischemic changes of the white matter. Chronic cerebellar infarcts Electronically Signed   By: Donavan Foil M.D.   On: 02/21/2021 16:10   DG Chest Port 1 View  Result Date: 02/21/2021 CLINICAL DATA:  Altered mental status, loss of consciousness EXAM: PORTABLE CHEST 1 VIEW COMPARISON:  Portable exam 1621 hours compared to 12/03/2020 FINDINGS: Normal heart size, mediastinal contours, and pulmonary vascularity. Minimal bibasilar atelectasis. Lungs otherwise clear. No infiltrate, pleural effusion, or pneumothorax.  Osseous structures unremarkable. IMPRESSION: Minimal bibasilar atelectasis. Electronically Signed   By: Lavonia Dana M.D.   On: 02/21/2021 16:38     Scheduled Meds:  aspirin EC  81 mg Oral Daily   atorvastatin  20 mg Oral QPM   brimonidine  1 drop Both Eyes BID   dorzolamide-timolol  1 drop Both Eyes BID   enoxaparin (LOVENOX) injection  30 mg Subcutaneous Q24H   feeding supplement (GLUCERNA SHAKE)  237 mL Oral TID BM   insulin aspart  0-9 Units Subcutaneous TID WC   latanoprost  1 drop Both Eyes QHS   levothyroxine  88 mcg Oral QHS   memantine  10 mg Oral BID   metoprolol tartrate  25 mg Oral BID   Netarsudil Dimesylate  1 drop Both Eyes QHS   pantoprazole  40 mg Oral BID AC   polyethylene glycol  17 g Oral Daily   tamsulosin  0.4 mg Oral BID   Continuous Infusions:  cefTRIAXone (ROCEPHIN)  IV  LOS: 0 days   Time spent: Schulter, MD [facility] To contact the attending provider between 7A-7P or the covering provider during after hours 7P-7A, please log into the web site [email or web address] and access using universal [place] password for that web site. If you do not have the password, please call the hospital operator.  02/22/2021, 7:48 AM

## 2021-02-22 NOTE — Plan of Care (Signed)
Pt admitted for confusion, Pt A&OX2, LR at 200 started, Tele NSR, no skin issues noted BP (!) 161/85 (BP Location: Left Arm)   Pulse 98   Temp 98 F (36.7 C) (Oral)   Resp 18   SpO2 100%  No c/o pain. Pt oriented to floor, call bell nearby, bed alarm on 2 of 4 rails up. Louanne Skye 02/22/21 1:04 AM

## 2021-02-23 DIAGNOSIS — R4182 Altered mental status, unspecified: Secondary | ICD-10-CM | POA: Diagnosis not present

## 2021-02-23 DIAGNOSIS — E440 Moderate protein-calorie malnutrition: Secondary | ICD-10-CM | POA: Insufficient documentation

## 2021-02-23 LAB — BASIC METABOLIC PANEL
Anion gap: 6 (ref 5–15)
BUN: 16 mg/dL (ref 8–23)
CO2: 23 mmol/L (ref 22–32)
Calcium: 8.6 mg/dL — ABNORMAL LOW (ref 8.9–10.3)
Chloride: 106 mmol/L (ref 98–111)
Creatinine, Ser: 1.23 mg/dL (ref 0.61–1.24)
GFR, Estimated: 59 mL/min — ABNORMAL LOW (ref >60)
Glucose, Bld: 128 mg/dL — ABNORMAL HIGH (ref 70–99)
Potassium: 4 mmol/L (ref 3.5–5.1)
Sodium: 135 mmol/L (ref 135–145)

## 2021-02-23 LAB — CBC WITH DIFFERENTIAL/PLATELET
Abs Immature Granulocytes: 0.01 10*3/uL (ref 0.00–0.07)
Basophils Absolute: 0 10*3/uL (ref 0.0–0.1)
Basophils Relative: 1 %
Eosinophils Absolute: 0.2 10*3/uL (ref 0.0–0.5)
Eosinophils Relative: 3 %
HCT: 31.3 % — ABNORMAL LOW (ref 39.0–52.0)
Hemoglobin: 10.2 g/dL — ABNORMAL LOW (ref 13.0–17.0)
Immature Granulocytes: 0 %
Lymphocytes Relative: 22 %
Lymphs Abs: 1.3 10*3/uL (ref 0.7–4.0)
MCH: 29.8 pg (ref 26.0–34.0)
MCHC: 32.6 g/dL (ref 30.0–36.0)
MCV: 91.5 fL (ref 80.0–100.0)
Monocytes Absolute: 0.5 10*3/uL (ref 0.1–1.0)
Monocytes Relative: 8 %
Neutro Abs: 4 10*3/uL (ref 1.7–7.7)
Neutrophils Relative %: 66 %
Platelets: 198 10*3/uL (ref 150–400)
RBC: 3.42 MIL/uL — ABNORMAL LOW (ref 4.22–5.81)
RDW: 15.8 % — ABNORMAL HIGH (ref 11.5–15.5)
WBC: 6 10*3/uL (ref 4.0–10.5)
nRBC: 0 % (ref 0.0–0.2)

## 2021-02-23 LAB — GLUCOSE, CAPILLARY
Glucose-Capillary: 119 mg/dL — ABNORMAL HIGH (ref 70–99)
Glucose-Capillary: 191 mg/dL — ABNORMAL HIGH (ref 70–99)
Glucose-Capillary: 126 mg/dL — ABNORMAL HIGH (ref 70–99)
Glucose-Capillary: 121 mg/dL — ABNORMAL HIGH (ref 70–99)

## 2021-02-23 MED ORDER — ADULT MULTIVITAMIN W/MINERALS CH
1.0000 | ORAL_TABLET | Freq: Every day | ORAL | Status: DC
  Administered 2021-02-23 – 2021-02-24 (×2): 1 via ORAL
  Filled 2021-02-23 (×2): qty 1

## 2021-02-23 NOTE — Progress Notes (Signed)
Initial Nutrition Assessment  DOCUMENTATION CODES:  Non-severe (moderate) malnutrition in context of chronic illness  INTERVENTION:  Continue Glucerna shakes TID.  Add MVI with minerals daily.  Recommend feeding assistance as needed during meals.  NUTRITION DIAGNOSIS:  Moderate Malnutrition related to chronic illness (dementia, prior CVA) as evidenced by mild fat depletion, moderate fat depletion, mild muscle depletion, moderate muscle depletion, percent weight loss.  GOAL:  Patient will meet greater than or equal to 90% of their needs  MONITOR:  PO intake, Supplement acceptance, Labs, Weight trends, I & O's  REASON FOR ASSESSMENT:  Malnutrition Screening Tool    ASSESSMENT:  85 yo male with a PMH of chronically wheelchair-bound, prior upper GI bleed 12/06/2020 for ulcers in the stomach duodenum hospitalization complicated by Enterobacter E. coli UTI, CKD stage 3, BPH, DM TY 2 with proliferative retinopathy of both eyes, childhood polio with right lower extremity atrophy + right sided CVA + postinfarct dementia, hypothyroid who presented to [facility] ED with confusion more than his baseline-seeing and talking to people not present-slumped over and was minimally responsive at home-became more alert. Admitted with AMS.  Per Epic, pt ate 25% of breakfast, 0% of lunch, and 100% of dinner yesterday. Pt ate 50% of breakfast this morning.  Spoke with pt's wife at bedside. Pt initially asleep during RD visit. Pt's wife reports that pt is a "picky eater." She reports he usually enjoys fruits and applesauce, as well Glucerna shakes. RD suspects pt is not meeting nutrition needs consistently at home.  Pt's wife reports no noticeable weight loss. Pt woke up and reported some weight loss. Per Epic, pt has lost 18.5 lbs (10.1%) in the last 2.5 months, which is significant and severe for the time frame.  On exam, pt with some mild to moderate depletions. Exception includes R leg due to polio as a child  and leg is severely depleted and atrophied.  Recommend continuing Glucerna shakes TID and adding feeding assistance with meals as well as MVI with minerals daily.  Given good A1c, pt may need diet liberalization if PO intake does not improve.  Supplements: Glucerna shakes TID  Medications: reviewed; SSI, Synthroid, Protonix BID, miralax, LR @ 75 ml/hr  Labs: reviewed; CBG 119-215 (H) HbA1c: 6.5% (11/25/2020)  NUTRITION - FOCUSED PHYSICAL EXAM: Flowsheet Row Most Recent Value  Orbital Region Mild depletion  Upper Arm Region Mild depletion  Thoracic and Lumbar Region No depletion  Buccal Region Moderate depletion  Temple Region Moderate depletion  Clavicle Bone Region Mild depletion  Clavicle and Acromion Bone Region Mild depletion  Scapular Bone Region Mild depletion  Dorsal Hand Moderate depletion  Patellar Region Moderate depletion  Anterior Thigh Region Moderate depletion  Posterior Calf Region Moderate depletion  Edema (RD Assessment) None  Hair Reviewed  Eyes Reviewed  Mouth Reviewed  Skin Reviewed  Nails Reviewed   Diet Order:   Diet Order             Diet heart healthy/carb modified Room service appropriate? Yes with Assist; Fluid consistency: Thin  Diet effective now                  EDUCATION NEEDS:  Education needs have been addressed  Skin:  Skin Assessment: Reviewed RN Assessment  Last BM:  02/23/21 - Type 2, large  Height:  Ht Readings from Last 1 Encounters:  02/22/21 '5\' 5"'$  (1.651 m)   Weight:  Wt Readings from Last 1 Encounters:  02/22/21 75 kg   BMI:  Body  mass index is 27.51 kg/m.  Estimated Nutritional Needs:  Kcal:  1900-2100 Protein:  90-105 grams Fluid:  >1.9 L  Derrel Nip, RD, LDN (she/her/hers) Registered Dietitian I After-Hours/Weekend Pager # in [place]

## 2021-02-23 NOTE — Progress Notes (Signed)
PROGRESS NOTE   Mike Mack  [id number] DOB: [birth year] DOA: 02/21/2021 PCP: Jani Gravel, MD  Brief Narrative:  85 year old black male chronically wheelchair-bound Prior upper GI bleed 12/06/2020 for ulcers in the stomach duodenum hospitalization complicated by Enterobacter E. coli UTI, CKD 3, BPH, DM TY 2 with proliferative retinopathy of both eyes, childhood polio with right lower extremity atrophy + right sided CVA + postinfarct dementia, hypothyroid  Presented to [facility] ED with confusion more than his baseline-seeing and talking to people not present-slumped over and was minimally responsive at home-became more alert  Work-up revealed abnormal urinalysis with large leukocytes BUN/creatinine 19/1.5, white count 9.6--patient did not appear to be septic on admission CT head with contrast negative for acute abnormality chest x-ray minimal atelectasis  Hospital-Problem based course  Underlying BPH probable UTI 9/5--Follow urine culture blood culture performed 9/5, continue ceftriaxone, continue LR 75 cc/H for now, continue Flomax 0.4 twice daily  DM TY 2 proliferative retinopathy--CBG ranging 185-275 and is being covered by ice sensitive sliding scale, at home is apparently not on any meds--initiate Lantus 5 units may need to have discussion about this in the outpatient--continue all eyedrops including latanoprost Cosopt Alphagan and netarsudil  Prior upper GI bleed 12/06/2020 with stomach ulcers--continue Protonix 40 twice daily  Childhood polio right lower extremity atrophy, right-sided CVA and postinfarct dementia--continue Namenda 10 twice daily, mobilizes in wheelchair typically   Acute metabolic encephalopathy. Secondary to UTI.  Monitor.  Still has minimal oral intake.  Hypertension---continue metoprolol 25 twice daily  hypothyroid--continue Synthroid 88 mcg daily Hx   DVT prophylaxis: Lovenox Code Status: DNR Family Communication: No family at bedside.  Consultants:      None as yet  Procedures: None  Antimicrobials: Ceftriaxone   Subjective: No nausea no vomiting no fever no chills.  No chest pain abdominal pain.   Objective: Vitals:   02/22/21 2105 02/23/21 0407 02/23/21 0746 02/23/21 1641  BP: (!) 151/74 (!) 154/71 (!) 166/82 (!) 164/82  Pulse: 78 80 89 77  Resp: '18 20 17 18  '$ Temp: 98 F (36.7 C) 98.1 F (36.7 C) 98.3 F (36.8 C) 98.5 F (36.9 C)  TempSrc: Oral Oral Oral Oral  SpO2: 100% 100% 100% 100%  Weight:      Height:        Intake/Output Summary (Last 24 hours) at 02/23/2021 2021 Last data filed at 02/23/2021 1700 Gross per 24 hour  Intake 1141.71 ml  Output 900 ml  Net 241.71 ml    Filed Weights   02/22/21 1500  Weight: 75 kg    Examination:  General: Appear in mild distress, no Rash; Oral Mucosa Clear, moist. no Abnormal Neck Mass Or lumps, Conjunctiva normal  Cardiovascular: S1 and S2 Present, no Murmur, Respiratory: good respiratory effort, Bilateral Air entry present and CTA, no Crackles, no wheezes Abdomen: Bowel Sound present, Soft and no tenderness Extremities: no Pedal edema Neurology: alert and oriented to time, place, and person affect appropriate. no new focal deficit Gait not checked due to patient safety concerns   Data Reviewed: personally reviewed   CBC    Component Value Date/Time   WBC 6.0 02/23/2021 0045   RBC 3.42 (L) 02/23/2021 0045   HGB 10.2 (L) 02/23/2021 0045   HCT 31.3 (L) 02/23/2021 0045   PLT 198 02/23/2021 0045   MCV 91.5 02/23/2021 0045   MCH 29.8 02/23/2021 0045   MCHC 32.6 02/23/2021 0045   RDW 15.8 (H) 02/23/2021 0045   LYMPHSABS 1.3 02/23/2021 0045   MONOABS  0.5 02/23/2021 0045   EOSABS 0.2 02/23/2021 0045   BASOSABS 0.0 02/23/2021 0045   CMP Latest Ref Rng & Units 02/23/2021 02/22/2021 02/21/2021  Glucose 70 - 99 mg/dL 128(H) 275(H) -  BUN 8 - 23 mg/dL 16 19 -  Creatinine 0.61 - 1.24 mg/dL 1.23 1.34(H) -  Sodium 135 - 145 mmol/L 135 136 141  Potassium 3.5 - 5.1 mmol/L 4.0  4.4 4.1  Chloride 98 - 111 mmol/L 106 106 -  CO2 22 - 32 mmol/L 23 25 -  Calcium 8.9 - 10.3 mg/dL 8.6(L) 9.3 -  Total Protein 6.5 - 8.1 g/dL - - -  Total Bilirubin 0.3 - 1.2 mg/dL - - -  Alkaline Phos 38 - 126 U/L - - -  AST 15 - 41 U/L - - -  ALT 0 - 44 U/L - - -     Radiology Studies: No results found.   Scheduled Meds:  aspirin EC  81 mg Oral Daily   atorvastatin  20 mg Oral QPM   brimonidine  1 drop Both Eyes BID   dorzolamide-timolol  1 drop Both Eyes BID   enoxaparin (LOVENOX) injection  30 mg Subcutaneous Q24H   feeding supplement (GLUCERNA SHAKE)  237 mL Oral TID BM   insulin aspart  0-9 Units Subcutaneous TID WC   latanoprost  1 drop Both Eyes QHS   levothyroxine  88 mcg Oral QHS   memantine  10 mg Oral BID   metoprolol tartrate  25 mg Oral BID   multivitamin with minerals  1 tablet Oral Daily   Netarsudil Dimesylate  1 drop Both Eyes QHS   pantoprazole  40 mg Oral BID AC   polyethylene glycol  17 g Oral Daily   tamsulosin  0.4 mg Oral BID   Continuous Infusions:  cefTRIAXone (ROCEPHIN)  IV 1 g (02/23/21 0854)   lactated ringers 75 mL/hr at 02/23/21 0354     LOS: 1 day   Time spent: [place], MD [facility] To contact the attending provider between 7A-7P or the covering provider during after hours 7P-7A, please log into the web site [email or web address] and access using universal [place] password for that web site. If you do not have the password, please call the hospital operator.  02/23/2021, 8:22 PM

## 2021-02-23 NOTE — TOC Initial Note (Signed)
Transition of Care [facility]) - Initial/Assessment Note    Patient Details  Name: Mike Mack MRN: [id number] Date of Birth: Oct 24, 1935  [facility] CM/SW Contact:    Marilu Favre, RN Phone Number: 02/23/2021, 3:56 PM  Clinical Narrative:                 Called patient's spouse Takumi Sowerby [phone]. Patient from home with wife. Has DME and was receiving home health services through [facility], she would like to continue with [place].  NCM called [place] with [facility] confirmed patient is active with them for home health PT/OT and RN. Entered resumption of care orders.  Expected Discharge Plan: [place] Barriers to Discharge: Continued Medical Work up   Patient Goals and CMS Choice     Choice offered to / list presented to : Spouse  Expected Discharge Plan and Services Expected Discharge Plan: [place]   Discharge Planning Services: CM Consult Post Acute Care Choice: [place] arrangements for the past 2 months: Single Family Home                 DME Arranged: N/A           HH Agency: [place] Date [place]: 02/23/21 Time [place]: 1555 Representative spoke with at [place]: [place]  Prior Living Arrangements/Services Living arrangements for the past 2 months: [place] with:: Spouse Patient language and need for interpreter reviewed:: Yes Do you feel safe going back to the place where you live?: Yes      Need for Family Participation in Patient Care: Yes (Comment) Care giver support system in place?: Yes (comment) Current home services: DME, Home PT Criminal Activity/Legal Involvement Pertinent to Current Situation/Hospitalization: No - Comment as needed  Activities of Daily Living Home Assistive Devices/Equipment: Wheelchair ADL Screening (condition at time of admission) Patient's cognitive ability adequate to safely complete daily  activities?: No Is the patient deaf or have difficulty hearing?: No Does the patient have difficulty seeing, even when wearing glasses/contacts?: No Does the patient have difficulty concentrating, remembering, or making decisions?: Yes Patient able to express need for assistance with ADLs?: Yes Does the patient have difficulty dressing or bathing?: Yes Independently performs ADLs?: No Communication: Needs assistance Is this a change from baseline?: Pre-admission baseline Dressing (OT): Needs assistance Is this a change from baseline?: Pre-admission baseline Grooming: Needs assistance Is this a change from baseline?: Pre-admission baseline Feeding: Needs assistance Is this a change from baseline?: Pre-admission baseline Bathing: Needs assistance Is this a change from baseline?: Pre-admission baseline Toileting: Needs assistance Is this a change from baseline?: Pre-admission baseline In/Out Bed: Needs assistance Is this a change from baseline?: Pre-admission baseline Walks in Home: Needs assistance Is this a change from baseline?: Pre-admission baseline Does the patient have difficulty walking or climbing stairs?: Yes Weakness of Legs: Both Weakness of Arms/Hands: Both  Permission Sought/Granted                  Emotional Assessment Appearance:: Appears stated age Attitude/Demeanor/Rapport: Engaged Affect (typically observed): Accepting Orientation: : Oriented to Self, Oriented to Place, Oriented to  Time, Oriented to Situation Alcohol / Substance Use: Not Applicable Psych Involvement: No (comment)  Admission diagnosis:  Altered mental status [R41.82] UTI (urinary tract infection) [N39.0] Patient Active Problem List   Diagnosis Date Noted   Malnutrition of moderate degree 02/23/2021   UTI (urinary tract infection) 02/22/2021  Altered mental status 02/21/2021   CKD (chronic kidney disease) stage 3, GFR 30-59 ml/min (HCC) 02/21/2021   Dementia ([place]) 11/27/2020   Polio  osteopathy of lower leg, left ([place]) 11/27/2020   Subphrenic abscess ([place]) 03/19/2020   Failure to thrive (child) 03/19/2020   Posterior vitreous detachment of both eyes 12/11/2019   Syncope 10/25/2014   AKI (acute kidney injury) ([place]) 10/25/2014   H/O: CVA (cerebrovascular accident) 10/25/2014   H/O poliomyelitis 10/25/2014   Type 2 diabetes mellitus ([place]) 10/25/2014   Tachycardia 08/29/2013   Cough 08/29/2013   Monoparesis of leg ([place]) 08/20/2013   Muscle weakness of lower extremity 08/20/2013   Right upper quadrant abdominal abscess ([place]) 08/15/2013   Hepatic abscess 08/15/2013   Subacute delirium 08/14/2013   Hyperglycemia 08/14/2013   Oral thrush 08/14/2013   Leukocytosis, unspecified 08/14/2013   Debility 08/14/2013   S/P cholecystectomy 07/29/2013   Cholecystitis with cholelithiasis 07/25/2013   Arthritis 06/06/2013   Headache(784.0) 06/06/2013   Headache 06/06/2013   Generalized weakness 05/24/2013   Cholecystitis, acute 05/24/2013   RUQ abdominal pain 05/13/2013   Acute cholecystitis: Probable 05/13/2013   Constipation 05/13/2013   Enterococcus UTI 05/13/2013   Leukocytosis 05/13/2013   Anemia 05/13/2013   Acute left thalamic CVA (cerebral infarction) 04/10/2012   Hypertension    BPH (benign prostatic hyperplasia)    Pruritic disorder 02/02/2011   PCP:  Jani Gravel, MD Pharmacy[facility]   [id number]Phone: [phone]: [phone]    Social Determinants of Health (SDOH) Interventions    Readmission Risk Interventions No flowsheet data found.

## 2021-02-23 NOTE — Plan of Care (Signed)
  Problem: Clinical Measurements: Goal: Diagnostic test results will improve Outcome: Progressing Goal: Cardiovascular complication will be avoided Outcome: Progressing   

## 2021-02-23 NOTE — Evaluation (Signed)
Physical Therapy Evaluation Patient Details Name: Mike Mack MRN: [id number] DOB: 1935/12/18 Today's Date: 02/23/2021   History of Present Illness  85 year old black male chronically wheelchair-bound who presents with AMS worse than his baseline. Urinalysis abnormal.  PMH: upper GI bleed 12/06/2020 for ulcers in the stomach duodenum hospitalization complicated by Enterobacter E. coli UTI, CKD 3, BPH, DM 2 with proliferative retinopathy of both eyes, childhood polio with right lower extremity atrophy + right sided CVA + postinfarct dementia, hypothyroid.  Clinical Impression  Pt admitted with above diagnosis. Pt from home with wife where he has been receiving HHPT. Pt was able to transfer to w/c and was taking a few steps. Pt's family has been essentially carrying him up the steps into his home and would benefit from a ramp. On eval, pt was very lethargic. Needed mod A to come to EOB. Pt performed sit<>stand 2x with min A +2 to power up and to steady. Pt with BM in standing and was able to tolerate standing long enough for clean up. Was unable to step feet in standing to progress ambulation. Expect he will progress well when less lethargic.  Pt currently with functional limitations due to the deficits listed below (see PT Problem List). Pt will benefit from skilled PT to increase their independence and safety with mobility to allow discharge to the venue listed below.       Follow Up Recommendations Home health PT;Supervision/Assistance - 24 hour    Equipment Recommendations  None recommended by PT    Recommendations for Other Services OT consult     Precautions / Restrictions Precautions Precautions: Fall Restrictions Weight Bearing Restrictions: No      Mobility  Bed Mobility Overal bed mobility: Needs Assistance Bed Mobility: Supine to Sit;Sit to Supine     Supine to sit: Mod assist Sit to supine: Max assist;+2 for physical assistance   General bed mobility comments: mod A  for LE's to EOB and mod A for elevation of trunk, pt able to use UE's to assist    Transfers Overall transfer level: Needs assistance Equipment used: Rolling walker (2 wheeled) Transfers: Sit to/from Stand Sit to Stand: Min assist;+2 physical assistance         General transfer comment: min +2 for power up and to steady  Ambulation/Gait             General Gait Details: attempted side steps EOB but pt unable to step feet and began having BM  Stairs            Wheelchair Mobility    Modified Rankin (Stroke Patients Only)       Balance Overall balance assessment: Needs assistance Sitting-balance support: Feet supported;No upper extremity supported Sitting balance-Leahy Scale: Fair     Standing balance support: Bilateral upper extremity supported Standing balance-Leahy Scale: Poor Standing balance comment: reliant on UE's and external support                             Pertinent Vitals/Pain Pain Assessment: Faces Faces Pain Scale: No hurt    Home Living Family/patient expects to be discharged to:: Private residence Living Arrangements: Spouse/significant other Available Help at Discharge: Family;Available 24 hours/day Type of Home: House Home Access: Stairs to enter Entrance Stairs-Rails: Right;Left;Can reach both Entrance Stairs-Number of Steps: 2 Home Layout: One level Home Equipment: Walker - 2 wheels;Cane - single point;Wheelchair - manual;Grab bars - tub/shower;Grab bars - toilet;Shower seat;Hand held shower head  Prior Function Level of Independence: Needs assistance   Gait / Transfers Assistance Needed: has been walking a few steps with therapy at home  ADL's / Homemaking Assistance Needed: wife helps wash his back        Hand Dominance   Dominant Hand: Right    Extremity/Trunk Assessment   Upper Extremity Assessment Upper Extremity Assessment: Generalized weakness    Lower Extremity Assessment Lower Extremity  Assessment: Generalized weakness (R weaker than L due to polio/ CVA)    Cervical / Trunk Assessment Cervical / Trunk Assessment: Kyphotic  Communication   Communication: No difficulties  Cognition Arousal/Alertness: Lethargic;Suspect due to medications Behavior During Therapy: Flat affect Overall Cognitive Status: Impaired/Different from baseline Area of Impairment: Problem solving;Following commands                       Following Commands: Follows one step commands inconsistently;Follows one step commands with increased time     Problem Solving: Decreased initiation;Difficulty sequencing;Requires verbal cues;Requires tactile cues;Slow processing General Comments: pt very lethargic throughout, continual cues for eyes open. Does answer questions appropriately when stimulated      General Comments General comments (skin integrity, edema, etc.): pt's wife present on eval. Reports pt has been lethargic all day    Exercises     Assessment/Plan    PT Assessment Patient needs continued PT services  PT Problem List Decreased strength;Decreased activity tolerance;Decreased balance;Decreased mobility;Decreased cognition;Decreased safety awareness       PT Treatment Interventions DME instruction;Gait training;Stair training;Functional mobility training;Therapeutic activities;Therapeutic exercise;Patient/family education;Balance training    PT Goals (Current goals can be found in the Care Plan section)  Acute Rehab PT Goals Patient Stated Goal: return home PT Goal Formulation: With patient/family Time For Goal Achievement: 03/09/21 Potential to Achieve Goals: Good    Frequency Min 3X/week   Barriers to discharge        Co-evaluation               AM-PAC PT "6 Clicks" Mobility  Outcome Measure Help needed turning from your back to your side while in a flat bed without using bedrails?: A Little Help needed moving from lying on your back to sitting on the side of  a flat bed without using bedrails?: A Lot Help needed moving to and from a bed to a chair (including a wheelchair)?: A Lot Help needed standing up from a chair using your arms (e.g., wheelchair or bedside chair)?: A Lot Help needed to walk in hospital room?: Total Help needed climbing 3-5 steps with a railing? : Total 6 Click Score: 11    End of Session Equipment Utilized During Treatment: Gait belt;Oxygen Activity Tolerance: Patient limited by lethargy Patient left: in bed;with call bell/phone within reach;with bed alarm set;with family/visitor present Nurse Communication: Mobility status PT Visit Diagnosis: Muscle weakness (generalized) (M62.81);Difficulty in walking, not elsewhere classified (R26.2)    Time: [id number] PT Time Calculation (min) (ACUTE ONLY): 39 min   Charges:   PT Evaluation $PT Eval Moderate Complexity: 1 Mod PT Treatments $Therapeutic Activity: 23-37 mins        Leighton Roach, PT  Acute Rehab Services  Pager [phone] Office [place] 02/23/2021, 2:41 PM

## 2021-02-24 LAB — GLUCOSE, CAPILLARY
Glucose-Capillary: 105 mg/dL — ABNORMAL HIGH (ref 70–99)
Glucose-Capillary: 149 mg/dL — ABNORMAL HIGH (ref 70–99)

## 2021-02-24 MED ORDER — CEFPODOXIME PROXETIL 200 MG PO TABS: 200.0000 mg | ORAL_TABLET | Freq: Two times a day (BID) | ORAL | 0 refills | Status: AC

## 2021-02-24 NOTE — Evaluation (Signed)
Occupational Therapy Evaluation Patient Details Name: Mike Mack MRN: [id number] DOB: Aug 17, 1935 Today's Date: 02/24/2021    History of Present Illness 85 year old black male chronically wheelchair-bound who presents with AMS worse than his baseline. Urinalysis abnormal.  PMH: upper GI bleed 12/06/2020 for ulcers in the stomach duodenum hospitalization complicated by Enterobacter E. coli UTI, CKD 3, BPH, DM 2 with proliferative retinopathy of both eyes, childhood polio with right lower extremity atrophy + right sided CVA + postinfarct dementia, hypothyroid.   Clinical Impression   Patient is a poor historian with history obtained from wife present at bedside. PTA patient was living with his wife in a private residence and was requiring assistance with bathing/dressing and toileting at bed level. Patient was able to complete stand-pivot transfers from bed <> wc with external assist and use of RW. Patient currently functioning near baseline requiring Min A for UB dressing, Max A for LB dressing at bed level, and Total A for hygiene/clothing management after episode of bowel incontinence (patient seemingly unaware). Mod A with use of RW for stand-pivot to recliner. Patient also limited by deficits listed below including generalized weakness, decreased cognition (Hx of cognitive deficits) and decreased standing balance and would benefit from continued acute OT services. Wife present at bedside reports having a grandson and children that assist with her husbands care. Plan for d/c later today with wife reporting having assist from family to get patient out of the car once home.      Follow Up Recommendations  Home health OT;Supervision/Assistance - 24 hour    Equipment Recommendations  None recommended by OT (Patient has necessary DME)    Recommendations for Other Services       Precautions / Restrictions Precautions Precautions: Fall Precaution Comments: R knee buckles Restrictions Weight  Bearing Restrictions: No      Mobility Bed Mobility Overal bed mobility: Needs Assistance Bed Mobility: Supine to Sit;Rolling Rolling: Min assist;Mod assist   Supine to sit: Mod assist     General bed mobility comments: Min A for rolling to R and Mod A for rolling to L. Mod A for supine to EOB with HOB elevated, cues for hand placement and heavy use of rail.    Transfers Overall transfer level: Needs assistance Equipment used: Rolling walker (2 wheeled) Transfers: Sit to/from [facility] Sit to Stand: Mod assist Stand pivot transfers: Mod assist       General transfer comment: Mod A for sit to stand from slightly elevated EOB and for stand-pivot to recliner on R with use of RW. R knee buckles.    Balance Overall balance assessment: Needs assistance Sitting-balance support: Feet supported;No upper extremity supported Sitting balance-Leahy Scale: Fair     Standing balance support: Bilateral upper extremity supported Standing balance-Leahy Scale: Poor Standing balance comment: reliant on UE's and external support                           ADL either performed or assessed with clinical judgement   ADL Overall ADL's : Needs assistance/impaired                 Upper Body Dressing : Minimal assistance;Sitting Upper Body Dressing Details (indicate cue type and reason): Min A to don shirt/jacket seated EOB. Lower Body Dressing: Maximal assistance;Bed level Lower Body Dressing Details (indicate cue type and reason): Max A to thread BLE through brief and pants. Patient able to bridge in supine with assist to hike pants over  hips. Toilet Transfer: Moderate assistance;RW Toilet Transfer Details (indicate cue type and reason): Simulated with stand-pivot to recliner on L with use of RW. Toileting- Clothing Manipulation and Hygiene: Total assistance Toileting - Clothing Manipulation Details (indicate cue type and reason): Patient incontinent of bowel.  Total A for hygiene/clothing management in supine.             Vision         Perception     Praxis      Pertinent Vitals/Pain Pain Assessment: No/denies pain     Hand Dominance Right   Extremity/Trunk Assessment Upper Extremity Assessment Upper Extremity Assessment: Generalized weakness   Lower Extremity Assessment Lower Extremity Assessment: Generalized weakness (R weaker than L due to polio/ CVA)   Cervical / Trunk Assessment Cervical / Trunk Assessment: Kyphotic   Communication Communication Communication: No difficulties   Cognition Arousal/Alertness: Awake/alert Behavior During Therapy: Flat affect Overall Cognitive Status: History of cognitive impairments - at baseline                                 General Comments: A&O to person and place at baseline; sometimes oriented to time and situation (inconsistent); follows 1-step verbal commands with good accuracy.   General Comments  Wife present at bedside througout evaluation. Reports patient functioning closer to baseline today.    Exercises     Shoulder Instructions      Home Living Family/patient expects to be discharged to:: Private residence Living Arrangements: Spouse/significant other Available Help at Discharge: Family;Available 24 hours/day Type of Home: House Home Access: Stairs to enter [employer] of Steps: 2 Entrance Stairs-Rails: Right;Left;Can reach both Home Layout: One level     Bathroom Shower/Tub: Occupational psychologist: Handicapped height Bathroom Accessibility: Yes   Home Equipment: Environmental consultant - 2 wheels;Cane - single point;Wheelchair - manual;Grab bars - tub/shower;Grab bars - toilet;Shower seat;Hand held shower head;Hospital bed          Prior Functioning/Environment Level of Independence: Needs assistance  Gait / Transfers Assistance Needed: Able to stand-pivot from hospital bed <> wc with RW and external assist; mostly wheelchair bound  at baseline. ADL's / Homemaking Assistance Needed: Able to feed himself; wife assists with bathing/dressing/toileting at bedlevel; patient uses briefs.            OT Problem List: Decreased strength;Decreased range of motion;Decreased activity tolerance;Impaired balance (sitting and/or standing);Decreased coordination;Decreased cognition;Decreased safety awareness;Decreased knowledge of use of DME or AE      OT Treatment/Interventions: Self-care/ADL training;Therapeutic exercise;Energy conservation;DME and/or AE instruction;Therapeutic activities;Patient/family education;Balance training    OT Goals(Current goals can be found in the care plan section) Acute Rehab OT Goals Patient Stated Goal: Per wife, for patient to return home today. OT Goal Formulation: With family Time For Goal Achievement: 03/10/21 Potential to Achieve Goals: Good ADL Goals Pt Will Perform Grooming: with set-up;sitting Pt Will Perform Upper Body Dressing: with supervision;sitting Pt Will Perform Lower Body Dressing: with mod assist;bed level Pt/caregiver will Perform Home Exercise Program: Increased ROM;Increased strength;Both right and left upper extremity Additional ADL Goal #1: Patient will complete stand-pivot transfers with Min A and use of RW.  OT Frequency: Min 2X/week   Barriers to D/C:            Co-evaluation              AM-PAC OT "6 Clicks" Daily Activity     Outcome Measure Help from  another person eating meals?: A Little Help from another person taking care of personal grooming?: A Little Help from another person toileting, which includes using toliet, bedpan, or urinal?: Total Help from another person bathing (including washing, rinsing, drying)?: A Lot Help from another person to put on and taking off regular upper body clothing?: A Lot Help from another person to put on and taking off regular lower body clothing?: A Lot 6 Click Score: 13   End of Session Equipment Utilized During  Treatment: Gait belt;Rolling walker  Activity Tolerance: Patient tolerated treatment well Patient left: in chair;with call bell/phone within reach;with chair alarm set  OT Visit Diagnosis: Unsteadiness on feet (R26.81);Muscle weakness (generalized) (M62.81);Other symptoms and signs involving cognitive function                Time: [id number] OT Time Calculation (min): 32 min Charges:  OT General Charges $OT Visit: 1 Visit OT Evaluation $OT Eval Moderate Complexity: 1 Mod OT Treatments $Self Care/Home Management : 8-22 mins  Jashiya Bassett H. OTR/L Supplemental OT, Department of rehab services [phone]  Silus Lanzo R H. 02/24/2021, 11:52 AM

## 2021-02-26 DIAGNOSIS — I10 Essential (primary) hypertension: Secondary | ICD-10-CM | POA: Diagnosis not present

## 2021-02-26 DIAGNOSIS — E1140 Type 2 diabetes mellitus with diabetic neuropathy, unspecified: Secondary | ICD-10-CM | POA: Diagnosis not present

## 2021-02-26 DIAGNOSIS — D649 Anemia, unspecified: Secondary | ICD-10-CM | POA: Diagnosis not present

## 2021-02-26 DIAGNOSIS — N179 Acute kidney failure, unspecified: Secondary | ICD-10-CM | POA: Diagnosis not present

## 2021-02-26 DIAGNOSIS — E860 Dehydration: Secondary | ICD-10-CM | POA: Diagnosis not present

## 2021-02-26 DIAGNOSIS — N400 Enlarged prostate without lower urinary tract symptoms: Secondary | ICD-10-CM | POA: Diagnosis not present

## 2021-02-26 DIAGNOSIS — I69354 Hemiplegia and hemiparesis following cerebral infarction affecting left non-dominant side: Secondary | ICD-10-CM | POA: Diagnosis not present

## 2021-02-26 DIAGNOSIS — I2510 Atherosclerotic heart disease of native coronary artery without angina pectoris: Secondary | ICD-10-CM | POA: Diagnosis not present

## 2021-02-26 DIAGNOSIS — E113553 Type 2 diabetes mellitus with stable proliferative diabetic retinopathy, bilateral: Secondary | ICD-10-CM | POA: Diagnosis not present

## 2021-02-28 DIAGNOSIS — E1140 Type 2 diabetes mellitus with diabetic neuropathy, unspecified: Secondary | ICD-10-CM | POA: Diagnosis not present

## 2021-02-28 DIAGNOSIS — I69354 Hemiplegia and hemiparesis following cerebral infarction affecting left non-dominant side: Secondary | ICD-10-CM | POA: Diagnosis not present

## 2021-02-28 DIAGNOSIS — N400 Enlarged prostate without lower urinary tract symptoms: Secondary | ICD-10-CM | POA: Diagnosis not present

## 2021-02-28 DIAGNOSIS — E113553 Type 2 diabetes mellitus with stable proliferative diabetic retinopathy, bilateral: Secondary | ICD-10-CM | POA: Diagnosis not present

## 2021-02-28 DIAGNOSIS — E860 Dehydration: Secondary | ICD-10-CM | POA: Diagnosis not present

## 2021-02-28 DIAGNOSIS — I2510 Atherosclerotic heart disease of native coronary artery without angina pectoris: Secondary | ICD-10-CM | POA: Diagnosis not present

## 2021-02-28 DIAGNOSIS — D649 Anemia, unspecified: Secondary | ICD-10-CM | POA: Diagnosis not present

## 2021-02-28 DIAGNOSIS — N179 Acute kidney failure, unspecified: Secondary | ICD-10-CM | POA: Diagnosis not present

## 2021-02-28 DIAGNOSIS — I10 Essential (primary) hypertension: Secondary | ICD-10-CM | POA: Diagnosis not present

## 2021-02-28 NOTE — Discharge Summary (Signed)
[facility] Discharge Summary   Patient: Mike Mack [id number]  PCP: Mike Gravel, MD  Date of admission: 02/21/2021   Date of discharge: 02/24/2021     Discharge Diagnoses:  Principal Problem:   Altered mental status Active Problems:   BPH (benign prostatic hyperplasia)   H/O: CVA (cerebrovascular accident)   Type 2 diabetes mellitus ([place])   Dementia ([facility] [place])   CKD (chronic kidney disease) stage 3, GFR 30-59 ml/min (HCC)   UTI (urinary tract infection)   Malnutrition of moderate degree   Admitted From: home Disposition:  Home   Recommendations for Outpatient Follow-up:  PCP: please follow up with PCP in 1 week   Follow-up [place] [place] Follow up.   Specialty: Home Health Services Contact information: [address] [place] [address] [phone]         Mike Gravel, MD. Schedule an appointment as soon as possible for a visit in 1 week(s).   Specialty: Internal Medicine Contact information: [address] [place] [address] [phone]                Discharge Instructions     Diet - low sodium heart healthy   Complete by: As directed    Increase activity slowly   Complete by: As directed        Diet recommendation: Cardiac diet  Activity: The patient is advised to gradually reintroduce usual activities, as tolerated  Discharge Condition: stable  Code Status: DNR   History of present illness: As per the H and P dictated on admission, "Mike Mack is a 85 y.o. 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  History is limited from patient due to dementia and is otherwise supplemented by EDP, chart review, and patient's wife at bedside.  Patient has dementia with memory  issues at baseline.  He is wheelchair-bound due to prior stroke and history of childhood polio resulting in chronic right leg weakness and atrophy.   Patient's wife states that over the last 2 days he has seemed more confused than his baseline.  He seemed to be speaking to and seeing people who were not actually present.  After breakfast earlier today his wife noticed that he appeared to have slumped over and minimally responsive.  He was leaning over to his left side and she cannot wake him by calling his name or light stimulation.  This lasted 6 to 8 minutes and EMS was subsequently called.  EMS were able to awaken him some more but he still appeared lethargic prior to arrival.  Patient is now awake, alert, answering questions and following commands.  He does report recent dysuria.  He denies any chest pain, dyspnea, abdominal pain, or loose stools.  He is oriented to self.  He states that he is currently at home in his living room and that he is accompanied by his brothers and sisters (only wife is present).  His wife states that he appears to be improving but not quite back at his typical baseline.   ED Course:  Initial vitals show BP 142/75, pulse 83, RR 13, temp 97.1 F, SPO2 97% on room air.   Labs show WBC 9.6, hemoglobin 13.6, platelets 264,000, sodium 135, potassium 4.4, bicarb 28, BUN 19, creatinine 1.50, serum glucose 226, AST  20, ALT 18, alk phos 139, total bilirubin 0.7, ammonia <9.   ABG shows pH 7.360, PCO2 43.7, PO2 118.  Urinalysis shows 100 protein, negative nitrates, large leukocytes, >50 RBCs and WBCs/hpf, no bacteria on microscopy.  Urine culture was collected and pending.  SARS-CoV-2 PCR in process.   CT head without contrast is negative for acute intracranial normality.  Atrophy and chronic small vessel ischemic changes of white matter and chronic cerebellar infarcts noted.   Portable chest x-ray shows minimal bibasilar atelectasis.   Patient was given 1 L normal saline and  IV ceftriaxone.  The hospitalist service was consulted to admit for further evaluation and management."  Hospital Course:  Summary of his active problems in the hospital is as following. Underlying BPH  UTI Treated with ceftriaxone, LR No cultures here.  Switch to vantin.   continue Flomax 0.4 twice daily   DM TY 2 proliferative retinopathy Continue actos  continue all eyedrops including latanoprost Cosopt Alphagan and netarsudil   Prior upper GI bleed 12/06/2020 with stomach ulcers continue Protonix 40 twice daily  Childhood polio right lower extremity atrophy, right-sided CVA and postinfarct dementia continue Namenda 10 twice daily, mobilizes in wheelchair typically    Acute metabolic encephalopathy. Secondary to UTI. Improving. Oral intake better.    Hypertension continue metoprolol 25 twice daily   Hypothyroid continue Synthroid 88 mcg daily  Moderate malnutrition and failure to thrive Continue supplements  Body mass index is 27.51 kg/m.  Nutrition Problem: Moderate Malnutrition Etiology: chronic illness (dementia, prior CVA) Nutrition Interventions: Interventions: Glucerna shake, MVI Patient was seen by physical therapy, who recommended Home Health, . On the day of the discharge the patient's vitals were stable, and no other new acute medical condition were reported. The patient was felt safe to be discharge at Home with Home health.  Consultants: none Procedures: noen  DISCHARGE MEDICATION: Allergies as of 02/24/2021       Reactions   Metformin And Related Other (See Comments)   Gi intolerance   Other    Other reaction(s): Other (See Comments) Renal insufficiency Other reaction(s): GI Upset (intolerance) Gi intolerance   Soliqua [insulin Glargine-lixisenatide] Other (See Comments)   Stomach cramps        Medication List     TAKE these medications    acetaminophen 325 MG tablet Commonly known as: TYLENOL Take 2 tablets (650 mg total) by mouth  every 6 (six) hours as needed for mild pain (or Fever >/= 101).   amLODipine 5 MG tablet Commonly known as: NORVASC Take 5 mg by mouth daily.   aspirin EC 81 MG tablet Take 1 tablet (81 mg total) by mouth daily.   atorvastatin 20 MG tablet Commonly known as: LIPITOR Take 20 mg by mouth every evening.   B COMPLEX PO Take 1 tablet by mouth at bedtime.   brimonidine 0.2 % ophthalmic solution Commonly known as: ALPHAGAN Place 1 drop into both eyes 2 (two) times daily.   cholecalciferol 10 MCG (400 UNIT) Tabs tablet Commonly known as: VITAMIN D3 Take 400 Units by mouth daily.   dorzolamide-timolol 22.3-6.8 MG/ML ophthalmic solution Commonly known as: COSOPT Place 1 drop into both eyes 2 (two) times daily.   feeding supplement (GLUCERNA SHAKE) Liqd Take 237 mLs by mouth 3 (three) times daily between meals. What changed: when to take this   gabapentin 600 MG tablet Commonly known as: NEURONTIN Take 600 mg by mouth 2 (two) times daily as needed for headache.   latanoprost 0.005 % ophthalmic  solution Commonly known as: XALATAN Place 1 drop into both eyes at bedtime.   levothyroxine 88 MCG tablet Commonly known as: SYNTHROID Take 88 mcg by mouth at bedtime.   memantine 10 MG tablet Commonly known as: NAMENDA Take 10 mg by mouth 2 (two) times daily.   metoprolol tartrate 25 MG tablet Commonly known as: LOPRESSOR Take 1 tablet (25 mg total) by mouth 2 (two) times daily.   multivitamin with minerals Tabs tablet Take 1 tablet by mouth daily.   olmesartan 20 MG tablet Commonly known as: BENICAR Take 20 mg by mouth daily.   ondansetron 4 MG disintegrating tablet Commonly known as: Zofran ODT 66m ODT q4 hours prn nausea/vomit What changed:  how much to take how to take this when to take this reasons to take this additional instructions   pantoprazole 40 MG tablet Commonly known as: PROTONIX Take 1 tablet (40 mg total) by mouth 2 (two) times daily before a meal.    pioglitazone 30 MG tablet Commonly known as: ACTOS Take 30 mg by mouth daily.   polyethylene glycol 17 g packet Commonly known as: MIRALAX / GLYCOLAX Take 17 g by mouth daily. What changed:  when to take this reasons to take this   Rhopressa 0.02 % Soln Generic drug: Netarsudil Dimesylate Place 1 drop into both eyes at bedtime.   tamsulosin 0.4 MG Caps capsule Commonly known as: FLOMAX Take 0.4 mg by mouth in the morning and at bedtime.       ASK your doctor about these medications    cefpodoxime 200 MG tablet Commonly known as: VANTIN Take 1 tablet (200 mg total) by mouth 2 (two) times daily for 3 days. Ask about: Should I take this medication?        Discharge Exam: Filed Weights   02/22/21 1500  Weight: 75 kg   Vitals:   02/24/21 0335 02/24/21 0810  BP: (!) 152/67 (!) 174/67  Pulse: 72 94  Resp: 17 17  Temp: 98.4 F (36.9 C) 98.3 F (36.8 C)  SpO2: 100% 100%   General: Appear in mild distress, no Rash; Oral Mucosa Clear, moist. no Abnormal Neck Mass Or lumps, Conjunctiva normal  Cardiovascular: S1 and S2 Present, no Murmur, Respiratory: good respiratory effort, Bilateral Air entry present and CTA, no Crackles, no wheezes Abdomen: Bowel Sound present, Soft and no tenderness Extremities: no Pedal edema Neurology: alert and oriented to time, place, and person affect appropriate. no new focal deficit chronic weakness from polio Gait not checked due to patient safety concerns   The results of significant diagnostics from this hospitalization (including imaging, microbiology, ancillary and laboratory) are listed below for reference.    Significant Diagnostic Studies: CT HEAD WO CONTRAST  Result Date: 02/21/2021 CLINICAL DATA:  Mental status change lethargy EXAM: CT HEAD WITHOUT CONTRAST TECHNIQUE: Contiguous axial images were obtained from the base of the skull through the vertex without intravenous contrast. COMPARISON:  MRI 11/25/2020, CT brain 03/20/2019  FINDINGS: Brain: No acute territorial infarction, hemorrhage, or intracranial mass is visualized. Atrophy with chronic small-vessel ischemic changes of the white matter. Chronic cerebellar infarcts. Stable ventricle size. Vascular: No hyperdense vessels.  Carotid vascular calcification Skull: Fluid in the mastoids.  No fracture Sinuses/Orbits: No acute finding. Other: None IMPRESSION: 1. No CT evidence for acute intracranial abnormality. 2. Atrophy and chronic small vessel ischemic changes of the white matter. Chronic cerebellar infarcts Electronically Signed   By: KDonavan FoilM.D.   On: 02/21/2021 16:10   DG Chest  Port 1 View  Result Date: 02/21/2021 CLINICAL DATA:  Altered mental status, loss of consciousness EXAM: PORTABLE CHEST 1 VIEW COMPARISON:  Portable exam 1621 hours compared to 12/03/2020 FINDINGS: Normal heart size, mediastinal contours, and pulmonary vascularity. Minimal bibasilar atelectasis. Lungs otherwise clear. No infiltrate, pleural effusion, or pneumothorax. Osseous structures unremarkable. IMPRESSION: Minimal bibasilar atelectasis. Electronically Signed   By: Lavonia Dana M.D.   On: 02/21/2021 16:38    Microbiology: Recent Results (from the past 240 hour(s))  SARS CORONAVIRUS 2 (TAT 6-24 HRS) Nasopharyngeal Nasopharyngeal Swab     Status: None   Collection Time: 02/21/21  6:19 PM   Specimen: Nasopharyngeal Swab  Result Value Ref Range Status   SARS Coronavirus 2 NEGATIVE NEGATIVE Final    Comment: (NOTE) SARS-CoV-2 target nucleic acids are NOT DETECTED.  The SARS-CoV-2 RNA is generally detectable in upper and lower respiratory specimens during the acute phase of infection. Negative results do not preclude SARS-CoV-2 infection, do not rule out co-infections with other pathogens, and should not be used as the sole basis for treatment or other patient management decisions. Negative results must be combined with clinical observations, patient history, and epidemiological  information. The expected result is Negative.  Fact Sheet for Patients: SugarRoll.be  Fact Sheet for Healthcare Providers: https://www.woods-mathews.com/  This test is not yet approved or cleared by the [place] FDA and  has been authorized for detection and/or diagnosis of SARS-CoV-2 by FDA under an Emergency Use Authorization (EUA). This EUA will remain  in effect (meaning this test can be used) for the duration of the COVID-19 declaration under Se ction 564(b)(1) of the Act, 21 U.S.C. section 360bbb-3(b)(1), unless the authorization is terminated or revoked sooner.  Performed at [facility], [place] [address], [place], [place] [address]      Labs: CBC: Recent Labs  Lab 02/21/21 1542 02/21/21 1611 02/21/21 1753 02/22/21 0116 02/23/21 0045  WBC 9.6  --   --  9.1 6.0  NEUTROABS 7.5  --   --   --  4.0  HGB 13.6 15.3 12.2* 12.5* 10.2*  HCT 43.0 45.0 36.0* 40.1 31.3*  MCV 93.9  --   --  91.8 91.5  PLT 264  --   --  242 423   Basic Metabolic Panel: Recent Labs  Lab 02/21/21 1542 02/21/21 1611 02/21/21 1753 02/22/21 0116 02/23/21 0045  NA 135 141 141 136 135  K 4.4 4.5 4.1 4.4 4.0  CL 98  --   --  106 106  CO2 28  --   --  25 23  GLUCOSE 226*  --   --  275* 128*  BUN 19  --   --  19 16  CREATININE 1.50*  --   --  1.34* 1.23  CALCIUM 9.7  --   --  9.3 8.6*   Liver Function Tests: Recent Labs  Lab 02/21/21 1542  AST 20  ALT 18  ALKPHOS 139*  BILITOT 0.7  PROT 8.4*  ALBUMIN 3.4*   CBG: Recent Labs  Lab 02/23/21 1147 02/23/21 1651 02/23/21 2126 02/24/21 0806 02/24/21 1106  GLUCAP 191* 126* 121* 105* 149*    Time spent: 35 minutes  Signed:  Berle Mull  [facility] 02/24/2021

## 2021-03-01 DIAGNOSIS — E113553 Type 2 diabetes mellitus with stable proliferative diabetic retinopathy, bilateral: Secondary | ICD-10-CM | POA: Diagnosis not present

## 2021-03-01 DIAGNOSIS — E1122 Type 2 diabetes mellitus with diabetic chronic kidney disease: Secondary | ICD-10-CM | POA: Diagnosis not present

## 2021-03-01 DIAGNOSIS — N390 Urinary tract infection, site not specified: Secondary | ICD-10-CM | POA: Diagnosis not present

## 2021-03-01 DIAGNOSIS — N1831 Chronic kidney disease, stage 3a: Secondary | ICD-10-CM | POA: Diagnosis not present

## 2021-03-01 DIAGNOSIS — G8314 Monoplegia of lower limb affecting left nondominant side: Secondary | ICD-10-CM | POA: Diagnosis not present

## 2021-03-01 DIAGNOSIS — N179 Acute kidney failure, unspecified: Secondary | ICD-10-CM | POA: Diagnosis not present

## 2021-03-01 DIAGNOSIS — G839 Paralytic syndrome, unspecified: Secondary | ICD-10-CM | POA: Diagnosis not present

## 2021-03-01 DIAGNOSIS — G9341 Metabolic encephalopathy: Secondary | ICD-10-CM | POA: Diagnosis not present

## 2021-03-01 DIAGNOSIS — E039 Hypothyroidism, unspecified: Secondary | ICD-10-CM | POA: Diagnosis not present

## 2021-03-01 DIAGNOSIS — D631 Anemia in chronic kidney disease: Secondary | ICD-10-CM | POA: Diagnosis not present

## 2021-03-01 DIAGNOSIS — I7091 Generalized atherosclerosis: Secondary | ICD-10-CM | POA: Diagnosis not present

## 2021-03-01 DIAGNOSIS — F0280 Dementia in other diseases classified elsewhere without behavioral disturbance: Secondary | ICD-10-CM | POA: Diagnosis not present

## 2021-03-01 DIAGNOSIS — I129 Hypertensive chronic kidney disease with stage 1 through stage 4 chronic kidney disease, or unspecified chronic kidney disease: Secondary | ICD-10-CM | POA: Diagnosis not present

## 2021-03-02 DIAGNOSIS — F0280 Dementia in other diseases classified elsewhere without behavioral disturbance: Secondary | ICD-10-CM | POA: Diagnosis not present

## 2021-03-02 DIAGNOSIS — D631 Anemia in chronic kidney disease: Secondary | ICD-10-CM | POA: Diagnosis not present

## 2021-03-02 DIAGNOSIS — G839 Paralytic syndrome, unspecified: Secondary | ICD-10-CM | POA: Diagnosis not present

## 2021-03-02 DIAGNOSIS — N1831 Chronic kidney disease, stage 3a: Secondary | ICD-10-CM | POA: Diagnosis not present

## 2021-03-02 DIAGNOSIS — N390 Urinary tract infection, site not specified: Secondary | ICD-10-CM | POA: Diagnosis not present

## 2021-03-02 DIAGNOSIS — I129 Hypertensive chronic kidney disease with stage 1 through stage 4 chronic kidney disease, or unspecified chronic kidney disease: Secondary | ICD-10-CM | POA: Diagnosis not present

## 2021-03-02 DIAGNOSIS — E039 Hypothyroidism, unspecified: Secondary | ICD-10-CM | POA: Diagnosis not present

## 2021-03-02 DIAGNOSIS — E1122 Type 2 diabetes mellitus with diabetic chronic kidney disease: Secondary | ICD-10-CM | POA: Diagnosis not present

## 2021-03-02 DIAGNOSIS — G9341 Metabolic encephalopathy: Secondary | ICD-10-CM | POA: Diagnosis not present

## 2021-03-03 DIAGNOSIS — G9341 Metabolic encephalopathy: Secondary | ICD-10-CM | POA: Diagnosis not present

## 2021-03-03 DIAGNOSIS — Z23 Encounter for immunization: Secondary | ICD-10-CM | POA: Diagnosis not present

## 2021-03-03 DIAGNOSIS — N390 Urinary tract infection, site not specified: Secondary | ICD-10-CM | POA: Diagnosis not present

## 2021-03-03 DIAGNOSIS — E039 Hypothyroidism, unspecified: Secondary | ICD-10-CM | POA: Diagnosis not present

## 2021-03-03 DIAGNOSIS — F0280 Dementia in other diseases classified elsewhere without behavioral disturbance: Secondary | ICD-10-CM | POA: Diagnosis not present

## 2021-03-03 DIAGNOSIS — M25561 Pain in right knee: Secondary | ICD-10-CM | POA: Diagnosis not present

## 2021-03-07 DIAGNOSIS — D631 Anemia in chronic kidney disease: Secondary | ICD-10-CM | POA: Diagnosis not present

## 2021-03-07 DIAGNOSIS — E039 Hypothyroidism, unspecified: Secondary | ICD-10-CM | POA: Diagnosis not present

## 2021-03-07 DIAGNOSIS — G9341 Metabolic encephalopathy: Secondary | ICD-10-CM | POA: Diagnosis not present

## 2021-03-07 DIAGNOSIS — G839 Paralytic syndrome, unspecified: Secondary | ICD-10-CM | POA: Diagnosis not present

## 2021-03-07 DIAGNOSIS — F0280 Dementia in other diseases classified elsewhere without behavioral disturbance: Secondary | ICD-10-CM | POA: Diagnosis not present

## 2021-03-07 DIAGNOSIS — I129 Hypertensive chronic kidney disease with stage 1 through stage 4 chronic kidney disease, or unspecified chronic kidney disease: Secondary | ICD-10-CM | POA: Diagnosis not present

## 2021-03-07 DIAGNOSIS — N390 Urinary tract infection, site not specified: Secondary | ICD-10-CM | POA: Diagnosis not present

## 2021-03-07 DIAGNOSIS — N1831 Chronic kidney disease, stage 3a: Secondary | ICD-10-CM | POA: Diagnosis not present

## 2021-03-07 DIAGNOSIS — E1122 Type 2 diabetes mellitus with diabetic chronic kidney disease: Secondary | ICD-10-CM | POA: Diagnosis not present

## 2021-03-09 DIAGNOSIS — E1122 Type 2 diabetes mellitus with diabetic chronic kidney disease: Secondary | ICD-10-CM | POA: Diagnosis not present

## 2021-03-09 DIAGNOSIS — G839 Paralytic syndrome, unspecified: Secondary | ICD-10-CM | POA: Diagnosis not present

## 2021-03-09 DIAGNOSIS — G9341 Metabolic encephalopathy: Secondary | ICD-10-CM | POA: Diagnosis not present

## 2021-03-09 DIAGNOSIS — D631 Anemia in chronic kidney disease: Secondary | ICD-10-CM | POA: Diagnosis not present

## 2021-03-09 DIAGNOSIS — I129 Hypertensive chronic kidney disease with stage 1 through stage 4 chronic kidney disease, or unspecified chronic kidney disease: Secondary | ICD-10-CM | POA: Diagnosis not present

## 2021-03-09 DIAGNOSIS — N1831 Chronic kidney disease, stage 3a: Secondary | ICD-10-CM | POA: Diagnosis not present

## 2021-03-09 DIAGNOSIS — E039 Hypothyroidism, unspecified: Secondary | ICD-10-CM | POA: Diagnosis not present

## 2021-03-09 DIAGNOSIS — F0280 Dementia in other diseases classified elsewhere without behavioral disturbance: Secondary | ICD-10-CM | POA: Diagnosis not present

## 2021-03-09 DIAGNOSIS — N390 Urinary tract infection, site not specified: Secondary | ICD-10-CM | POA: Diagnosis not present

## 2021-03-10 DIAGNOSIS — G839 Paralytic syndrome, unspecified: Secondary | ICD-10-CM | POA: Diagnosis not present

## 2021-03-10 DIAGNOSIS — F0280 Dementia in other diseases classified elsewhere without behavioral disturbance: Secondary | ICD-10-CM | POA: Diagnosis not present

## 2021-03-10 DIAGNOSIS — N390 Urinary tract infection, site not specified: Secondary | ICD-10-CM | POA: Diagnosis not present

## 2021-03-10 DIAGNOSIS — D631 Anemia in chronic kidney disease: Secondary | ICD-10-CM | POA: Diagnosis not present

## 2021-03-10 DIAGNOSIS — E1122 Type 2 diabetes mellitus with diabetic chronic kidney disease: Secondary | ICD-10-CM | POA: Diagnosis not present

## 2021-03-10 DIAGNOSIS — N1831 Chronic kidney disease, stage 3a: Secondary | ICD-10-CM | POA: Diagnosis not present

## 2021-03-10 DIAGNOSIS — E039 Hypothyroidism, unspecified: Secondary | ICD-10-CM | POA: Diagnosis not present

## 2021-03-10 DIAGNOSIS — I129 Hypertensive chronic kidney disease with stage 1 through stage 4 chronic kidney disease, or unspecified chronic kidney disease: Secondary | ICD-10-CM | POA: Diagnosis not present

## 2021-03-10 DIAGNOSIS — G9341 Metabolic encephalopathy: Secondary | ICD-10-CM | POA: Diagnosis not present

## 2021-03-16 DIAGNOSIS — G9341 Metabolic encephalopathy: Secondary | ICD-10-CM | POA: Diagnosis not present

## 2021-03-16 DIAGNOSIS — G839 Paralytic syndrome, unspecified: Secondary | ICD-10-CM | POA: Diagnosis not present

## 2021-03-16 DIAGNOSIS — N1831 Chronic kidney disease, stage 3a: Secondary | ICD-10-CM | POA: Diagnosis not present

## 2021-03-16 DIAGNOSIS — N390 Urinary tract infection, site not specified: Secondary | ICD-10-CM | POA: Diagnosis not present

## 2021-03-16 DIAGNOSIS — D631 Anemia in chronic kidney disease: Secondary | ICD-10-CM | POA: Diagnosis not present

## 2021-03-16 DIAGNOSIS — E1122 Type 2 diabetes mellitus with diabetic chronic kidney disease: Secondary | ICD-10-CM | POA: Diagnosis not present

## 2021-03-16 DIAGNOSIS — I129 Hypertensive chronic kidney disease with stage 1 through stage 4 chronic kidney disease, or unspecified chronic kidney disease: Secondary | ICD-10-CM | POA: Diagnosis not present

## 2021-03-16 DIAGNOSIS — F0280 Dementia in other diseases classified elsewhere without behavioral disturbance: Secondary | ICD-10-CM | POA: Diagnosis not present

## 2021-03-16 DIAGNOSIS — E039 Hypothyroidism, unspecified: Secondary | ICD-10-CM | POA: Diagnosis not present

## 2021-03-17 DIAGNOSIS — N1831 Chronic kidney disease, stage 3a: Secondary | ICD-10-CM | POA: Diagnosis not present

## 2021-03-17 DIAGNOSIS — G839 Paralytic syndrome, unspecified: Secondary | ICD-10-CM | POA: Diagnosis not present

## 2021-03-17 DIAGNOSIS — N390 Urinary tract infection, site not specified: Secondary | ICD-10-CM | POA: Diagnosis not present

## 2021-03-17 DIAGNOSIS — G9341 Metabolic encephalopathy: Secondary | ICD-10-CM | POA: Diagnosis not present

## 2021-03-17 DIAGNOSIS — F0280 Dementia in other diseases classified elsewhere without behavioral disturbance: Secondary | ICD-10-CM | POA: Diagnosis not present

## 2021-03-17 DIAGNOSIS — D631 Anemia in chronic kidney disease: Secondary | ICD-10-CM | POA: Diagnosis not present

## 2021-03-17 DIAGNOSIS — E039 Hypothyroidism, unspecified: Secondary | ICD-10-CM | POA: Diagnosis not present

## 2021-03-17 DIAGNOSIS — I129 Hypertensive chronic kidney disease with stage 1 through stage 4 chronic kidney disease, or unspecified chronic kidney disease: Secondary | ICD-10-CM | POA: Diagnosis not present

## 2021-03-17 DIAGNOSIS — E1122 Type 2 diabetes mellitus with diabetic chronic kidney disease: Secondary | ICD-10-CM | POA: Diagnosis not present

## 2021-03-18 DIAGNOSIS — E039 Hypothyroidism, unspecified: Secondary | ICD-10-CM | POA: Diagnosis not present

## 2021-03-18 DIAGNOSIS — E1122 Type 2 diabetes mellitus with diabetic chronic kidney disease: Secondary | ICD-10-CM | POA: Diagnosis not present

## 2021-03-18 DIAGNOSIS — F0280 Dementia in other diseases classified elsewhere without behavioral disturbance: Secondary | ICD-10-CM | POA: Diagnosis not present

## 2021-03-18 DIAGNOSIS — G9341 Metabolic encephalopathy: Secondary | ICD-10-CM | POA: Diagnosis not present

## 2021-03-18 DIAGNOSIS — N390 Urinary tract infection, site not specified: Secondary | ICD-10-CM | POA: Diagnosis not present

## 2021-03-18 DIAGNOSIS — N1831 Chronic kidney disease, stage 3a: Secondary | ICD-10-CM | POA: Diagnosis not present

## 2021-03-18 DIAGNOSIS — D631 Anemia in chronic kidney disease: Secondary | ICD-10-CM | POA: Diagnosis not present

## 2021-03-18 DIAGNOSIS — I129 Hypertensive chronic kidney disease with stage 1 through stage 4 chronic kidney disease, or unspecified chronic kidney disease: Secondary | ICD-10-CM | POA: Diagnosis not present

## 2021-03-18 DIAGNOSIS — G839 Paralytic syndrome, unspecified: Secondary | ICD-10-CM | POA: Diagnosis not present

## 2021-03-21 DIAGNOSIS — F0280 Dementia in other diseases classified elsewhere without behavioral disturbance: Secondary | ICD-10-CM | POA: Diagnosis not present

## 2021-03-21 DIAGNOSIS — E039 Hypothyroidism, unspecified: Secondary | ICD-10-CM | POA: Diagnosis not present

## 2021-03-21 DIAGNOSIS — I129 Hypertensive chronic kidney disease with stage 1 through stage 4 chronic kidney disease, or unspecified chronic kidney disease: Secondary | ICD-10-CM | POA: Diagnosis not present

## 2021-03-21 DIAGNOSIS — G839 Paralytic syndrome, unspecified: Secondary | ICD-10-CM | POA: Diagnosis not present

## 2021-03-21 DIAGNOSIS — N1831 Chronic kidney disease, stage 3a: Secondary | ICD-10-CM | POA: Diagnosis not present

## 2021-03-21 DIAGNOSIS — G9341 Metabolic encephalopathy: Secondary | ICD-10-CM | POA: Diagnosis not present

## 2021-03-21 DIAGNOSIS — E1122 Type 2 diabetes mellitus with diabetic chronic kidney disease: Secondary | ICD-10-CM | POA: Diagnosis not present

## 2021-03-21 DIAGNOSIS — D631 Anemia in chronic kidney disease: Secondary | ICD-10-CM | POA: Diagnosis not present

## 2021-03-21 DIAGNOSIS — N390 Urinary tract infection, site not specified: Secondary | ICD-10-CM | POA: Diagnosis not present

## 2021-03-22 DIAGNOSIS — N390 Urinary tract infection, site not specified: Secondary | ICD-10-CM | POA: Diagnosis not present

## 2021-03-23 DIAGNOSIS — G9341 Metabolic encephalopathy: Secondary | ICD-10-CM | POA: Diagnosis not present

## 2021-03-23 DIAGNOSIS — D631 Anemia in chronic kidney disease: Secondary | ICD-10-CM | POA: Diagnosis not present

## 2021-03-23 DIAGNOSIS — F0280 Dementia in other diseases classified elsewhere without behavioral disturbance: Secondary | ICD-10-CM | POA: Diagnosis not present

## 2021-03-23 DIAGNOSIS — N390 Urinary tract infection, site not specified: Secondary | ICD-10-CM | POA: Diagnosis not present

## 2021-03-23 DIAGNOSIS — N1831 Chronic kidney disease, stage 3a: Secondary | ICD-10-CM | POA: Diagnosis not present

## 2021-03-23 DIAGNOSIS — E1122 Type 2 diabetes mellitus with diabetic chronic kidney disease: Secondary | ICD-10-CM | POA: Diagnosis not present

## 2021-03-23 DIAGNOSIS — E039 Hypothyroidism, unspecified: Secondary | ICD-10-CM | POA: Diagnosis not present

## 2021-03-23 DIAGNOSIS — I129 Hypertensive chronic kidney disease with stage 1 through stage 4 chronic kidney disease, or unspecified chronic kidney disease: Secondary | ICD-10-CM | POA: Diagnosis not present

## 2021-03-23 DIAGNOSIS — G839 Paralytic syndrome, unspecified: Secondary | ICD-10-CM | POA: Diagnosis not present

## 2021-03-28 DIAGNOSIS — N1831 Chronic kidney disease, stage 3a: Secondary | ICD-10-CM | POA: Diagnosis not present

## 2021-03-28 DIAGNOSIS — G9341 Metabolic encephalopathy: Secondary | ICD-10-CM | POA: Diagnosis not present

## 2021-03-28 DIAGNOSIS — D631 Anemia in chronic kidney disease: Secondary | ICD-10-CM | POA: Diagnosis not present

## 2021-03-28 DIAGNOSIS — N390 Urinary tract infection, site not specified: Secondary | ICD-10-CM | POA: Diagnosis not present

## 2021-03-28 DIAGNOSIS — G839 Paralytic syndrome, unspecified: Secondary | ICD-10-CM | POA: Diagnosis not present

## 2021-03-28 DIAGNOSIS — E1122 Type 2 diabetes mellitus with diabetic chronic kidney disease: Secondary | ICD-10-CM | POA: Diagnosis not present

## 2021-03-28 DIAGNOSIS — I129 Hypertensive chronic kidney disease with stage 1 through stage 4 chronic kidney disease, or unspecified chronic kidney disease: Secondary | ICD-10-CM | POA: Diagnosis not present

## 2021-03-28 DIAGNOSIS — E039 Hypothyroidism, unspecified: Secondary | ICD-10-CM | POA: Diagnosis not present

## 2021-03-28 DIAGNOSIS — F0280 Dementia in other diseases classified elsewhere without behavioral disturbance: Secondary | ICD-10-CM | POA: Diagnosis not present

## 2021-03-30 DIAGNOSIS — I129 Hypertensive chronic kidney disease with stage 1 through stage 4 chronic kidney disease, or unspecified chronic kidney disease: Secondary | ICD-10-CM | POA: Diagnosis not present

## 2021-03-30 DIAGNOSIS — N1831 Chronic kidney disease, stage 3a: Secondary | ICD-10-CM | POA: Diagnosis not present

## 2021-03-30 DIAGNOSIS — F0280 Dementia in other diseases classified elsewhere without behavioral disturbance: Secondary | ICD-10-CM | POA: Diagnosis not present

## 2021-03-30 DIAGNOSIS — E039 Hypothyroidism, unspecified: Secondary | ICD-10-CM | POA: Diagnosis not present

## 2021-03-30 DIAGNOSIS — D631 Anemia in chronic kidney disease: Secondary | ICD-10-CM | POA: Diagnosis not present

## 2021-03-30 DIAGNOSIS — G839 Paralytic syndrome, unspecified: Secondary | ICD-10-CM | POA: Diagnosis not present

## 2021-03-30 DIAGNOSIS — G9341 Metabolic encephalopathy: Secondary | ICD-10-CM | POA: Diagnosis not present

## 2021-03-30 DIAGNOSIS — E1122 Type 2 diabetes mellitus with diabetic chronic kidney disease: Secondary | ICD-10-CM | POA: Diagnosis not present

## 2021-03-30 DIAGNOSIS — N390 Urinary tract infection, site not specified: Secondary | ICD-10-CM | POA: Diagnosis not present

## 2021-03-31 DIAGNOSIS — G9341 Metabolic encephalopathy: Secondary | ICD-10-CM | POA: Diagnosis not present

## 2021-03-31 DIAGNOSIS — E1122 Type 2 diabetes mellitus with diabetic chronic kidney disease: Secondary | ICD-10-CM | POA: Diagnosis not present

## 2021-03-31 DIAGNOSIS — E113553 Type 2 diabetes mellitus with stable proliferative diabetic retinopathy, bilateral: Secondary | ICD-10-CM | POA: Diagnosis not present

## 2021-03-31 DIAGNOSIS — D631 Anemia in chronic kidney disease: Secondary | ICD-10-CM | POA: Diagnosis not present

## 2021-03-31 DIAGNOSIS — F0280 Dementia in other diseases classified elsewhere without behavioral disturbance: Secondary | ICD-10-CM | POA: Diagnosis not present

## 2021-03-31 DIAGNOSIS — G839 Paralytic syndrome, unspecified: Secondary | ICD-10-CM | POA: Diagnosis not present

## 2021-03-31 DIAGNOSIS — I129 Hypertensive chronic kidney disease with stage 1 through stage 4 chronic kidney disease, or unspecified chronic kidney disease: Secondary | ICD-10-CM | POA: Diagnosis not present

## 2021-03-31 DIAGNOSIS — N179 Acute kidney failure, unspecified: Secondary | ICD-10-CM | POA: Diagnosis not present

## 2021-03-31 DIAGNOSIS — G8314 Monoplegia of lower limb affecting left nondominant side: Secondary | ICD-10-CM | POA: Diagnosis not present

## 2021-03-31 DIAGNOSIS — N390 Urinary tract infection, site not specified: Secondary | ICD-10-CM | POA: Diagnosis not present

## 2021-03-31 DIAGNOSIS — I7091 Generalized atherosclerosis: Secondary | ICD-10-CM | POA: Diagnosis not present

## 2021-03-31 DIAGNOSIS — N1831 Chronic kidney disease, stage 3a: Secondary | ICD-10-CM | POA: Diagnosis not present

## 2021-03-31 DIAGNOSIS — E039 Hypothyroidism, unspecified: Secondary | ICD-10-CM | POA: Diagnosis not present

## 2021-04-06 DIAGNOSIS — G839 Paralytic syndrome, unspecified: Secondary | ICD-10-CM | POA: Diagnosis not present

## 2021-04-06 DIAGNOSIS — I129 Hypertensive chronic kidney disease with stage 1 through stage 4 chronic kidney disease, or unspecified chronic kidney disease: Secondary | ICD-10-CM | POA: Diagnosis not present

## 2021-04-06 DIAGNOSIS — F0280 Dementia in other diseases classified elsewhere without behavioral disturbance: Secondary | ICD-10-CM | POA: Diagnosis not present

## 2021-04-06 DIAGNOSIS — N390 Urinary tract infection, site not specified: Secondary | ICD-10-CM | POA: Diagnosis not present

## 2021-04-06 DIAGNOSIS — E1122 Type 2 diabetes mellitus with diabetic chronic kidney disease: Secondary | ICD-10-CM | POA: Diagnosis not present

## 2021-04-06 DIAGNOSIS — G9341 Metabolic encephalopathy: Secondary | ICD-10-CM | POA: Diagnosis not present

## 2021-04-06 DIAGNOSIS — E039 Hypothyroidism, unspecified: Secondary | ICD-10-CM | POA: Diagnosis not present

## 2021-04-06 DIAGNOSIS — N1831 Chronic kidney disease, stage 3a: Secondary | ICD-10-CM | POA: Diagnosis not present

## 2021-04-06 DIAGNOSIS — D631 Anemia in chronic kidney disease: Secondary | ICD-10-CM | POA: Diagnosis not present

## 2021-04-08 DIAGNOSIS — E039 Hypothyroidism, unspecified: Secondary | ICD-10-CM | POA: Diagnosis not present

## 2021-04-08 DIAGNOSIS — D631 Anemia in chronic kidney disease: Secondary | ICD-10-CM | POA: Diagnosis not present

## 2021-04-08 DIAGNOSIS — G839 Paralytic syndrome, unspecified: Secondary | ICD-10-CM | POA: Diagnosis not present

## 2021-04-08 DIAGNOSIS — N1831 Chronic kidney disease, stage 3a: Secondary | ICD-10-CM | POA: Diagnosis not present

## 2021-04-08 DIAGNOSIS — I129 Hypertensive chronic kidney disease with stage 1 through stage 4 chronic kidney disease, or unspecified chronic kidney disease: Secondary | ICD-10-CM | POA: Diagnosis not present

## 2021-04-08 DIAGNOSIS — F0280 Dementia in other diseases classified elsewhere without behavioral disturbance: Secondary | ICD-10-CM | POA: Diagnosis not present

## 2021-04-08 DIAGNOSIS — E1122 Type 2 diabetes mellitus with diabetic chronic kidney disease: Secondary | ICD-10-CM | POA: Diagnosis not present

## 2021-04-08 DIAGNOSIS — N390 Urinary tract infection, site not specified: Secondary | ICD-10-CM | POA: Diagnosis not present

## 2021-04-08 DIAGNOSIS — G9341 Metabolic encephalopathy: Secondary | ICD-10-CM | POA: Diagnosis not present

## 2021-04-12 ENCOUNTER — Encounter (INDEPENDENT_AMBULATORY_CARE_PROVIDER_SITE_OTHER): Payer: Medicare HMO | Admitting: Ophthalmology

## 2021-04-12 ENCOUNTER — Other Ambulatory Visit: Payer: Self-pay

## 2021-04-13 DIAGNOSIS — I129 Hypertensive chronic kidney disease with stage 1 through stage 4 chronic kidney disease, or unspecified chronic kidney disease: Secondary | ICD-10-CM | POA: Diagnosis not present

## 2021-04-13 DIAGNOSIS — E1122 Type 2 diabetes mellitus with diabetic chronic kidney disease: Secondary | ICD-10-CM | POA: Diagnosis not present

## 2021-04-13 DIAGNOSIS — N1831 Chronic kidney disease, stage 3a: Secondary | ICD-10-CM | POA: Diagnosis not present

## 2021-04-13 DIAGNOSIS — R300 Dysuria: Secondary | ICD-10-CM | POA: Diagnosis not present

## 2021-04-13 DIAGNOSIS — G839 Paralytic syndrome, unspecified: Secondary | ICD-10-CM | POA: Diagnosis not present

## 2021-04-13 DIAGNOSIS — G9341 Metabolic encephalopathy: Secondary | ICD-10-CM | POA: Diagnosis not present

## 2021-04-13 DIAGNOSIS — E039 Hypothyroidism, unspecified: Secondary | ICD-10-CM | POA: Diagnosis not present

## 2021-04-13 DIAGNOSIS — N390 Urinary tract infection, site not specified: Secondary | ICD-10-CM | POA: Diagnosis not present

## 2021-04-13 DIAGNOSIS — F0280 Dementia in other diseases classified elsewhere without behavioral disturbance: Secondary | ICD-10-CM | POA: Diagnosis not present

## 2021-04-13 DIAGNOSIS — D631 Anemia in chronic kidney disease: Secondary | ICD-10-CM | POA: Diagnosis not present

## 2021-04-15 DIAGNOSIS — D631 Anemia in chronic kidney disease: Secondary | ICD-10-CM | POA: Diagnosis not present

## 2021-04-15 DIAGNOSIS — N1831 Chronic kidney disease, stage 3a: Secondary | ICD-10-CM | POA: Diagnosis not present

## 2021-04-15 DIAGNOSIS — G9341 Metabolic encephalopathy: Secondary | ICD-10-CM | POA: Diagnosis not present

## 2021-04-15 DIAGNOSIS — I129 Hypertensive chronic kidney disease with stage 1 through stage 4 chronic kidney disease, or unspecified chronic kidney disease: Secondary | ICD-10-CM | POA: Diagnosis not present

## 2021-04-15 DIAGNOSIS — N390 Urinary tract infection, site not specified: Secondary | ICD-10-CM | POA: Diagnosis not present

## 2021-04-15 DIAGNOSIS — F0280 Dementia in other diseases classified elsewhere without behavioral disturbance: Secondary | ICD-10-CM | POA: Diagnosis not present

## 2021-04-15 DIAGNOSIS — G839 Paralytic syndrome, unspecified: Secondary | ICD-10-CM | POA: Diagnosis not present

## 2021-04-15 DIAGNOSIS — E039 Hypothyroidism, unspecified: Secondary | ICD-10-CM | POA: Diagnosis not present

## 2021-04-15 DIAGNOSIS — E1122 Type 2 diabetes mellitus with diabetic chronic kidney disease: Secondary | ICD-10-CM | POA: Diagnosis not present

## 2021-04-18 DIAGNOSIS — G9341 Metabolic encephalopathy: Secondary | ICD-10-CM | POA: Diagnosis not present

## 2021-04-18 DIAGNOSIS — N390 Urinary tract infection, site not specified: Secondary | ICD-10-CM | POA: Diagnosis not present

## 2021-04-18 DIAGNOSIS — F0280 Dementia in other diseases classified elsewhere without behavioral disturbance: Secondary | ICD-10-CM | POA: Diagnosis not present

## 2021-04-18 DIAGNOSIS — E1122 Type 2 diabetes mellitus with diabetic chronic kidney disease: Secondary | ICD-10-CM | POA: Diagnosis not present

## 2021-04-18 DIAGNOSIS — I129 Hypertensive chronic kidney disease with stage 1 through stage 4 chronic kidney disease, or unspecified chronic kidney disease: Secondary | ICD-10-CM | POA: Diagnosis not present

## 2021-04-18 DIAGNOSIS — E039 Hypothyroidism, unspecified: Secondary | ICD-10-CM | POA: Diagnosis not present

## 2021-04-18 DIAGNOSIS — D631 Anemia in chronic kidney disease: Secondary | ICD-10-CM | POA: Diagnosis not present

## 2021-04-18 DIAGNOSIS — G839 Paralytic syndrome, unspecified: Secondary | ICD-10-CM | POA: Diagnosis not present

## 2021-04-18 DIAGNOSIS — N1831 Chronic kidney disease, stage 3a: Secondary | ICD-10-CM | POA: Diagnosis not present

## 2021-04-21 DIAGNOSIS — R300 Dysuria: Secondary | ICD-10-CM | POA: Diagnosis not present

## 2021-04-21 DIAGNOSIS — N481 Balanitis: Secondary | ICD-10-CM | POA: Diagnosis not present

## 2021-04-21 DIAGNOSIS — N3000 Acute cystitis without hematuria: Secondary | ICD-10-CM | POA: Diagnosis not present

## 2021-04-21 DIAGNOSIS — N472 Paraphimosis: Secondary | ICD-10-CM | POA: Diagnosis not present

## 2021-04-22 DIAGNOSIS — E1122 Type 2 diabetes mellitus with diabetic chronic kidney disease: Secondary | ICD-10-CM | POA: Diagnosis not present

## 2021-04-22 DIAGNOSIS — N390 Urinary tract infection, site not specified: Secondary | ICD-10-CM | POA: Diagnosis not present

## 2021-04-22 DIAGNOSIS — D631 Anemia in chronic kidney disease: Secondary | ICD-10-CM | POA: Diagnosis not present

## 2021-04-22 DIAGNOSIS — E039 Hypothyroidism, unspecified: Secondary | ICD-10-CM | POA: Diagnosis not present

## 2021-04-22 DIAGNOSIS — I129 Hypertensive chronic kidney disease with stage 1 through stage 4 chronic kidney disease, or unspecified chronic kidney disease: Secondary | ICD-10-CM | POA: Diagnosis not present

## 2021-04-22 DIAGNOSIS — G9341 Metabolic encephalopathy: Secondary | ICD-10-CM | POA: Diagnosis not present

## 2021-04-22 DIAGNOSIS — N1831 Chronic kidney disease, stage 3a: Secondary | ICD-10-CM | POA: Diagnosis not present

## 2021-04-22 DIAGNOSIS — F0280 Dementia in other diseases classified elsewhere without behavioral disturbance: Secondary | ICD-10-CM | POA: Diagnosis not present

## 2021-04-22 DIAGNOSIS — G839 Paralytic syndrome, unspecified: Secondary | ICD-10-CM | POA: Diagnosis not present

## 2021-04-25 DIAGNOSIS — N1831 Chronic kidney disease, stage 3a: Secondary | ICD-10-CM | POA: Diagnosis not present

## 2021-04-25 DIAGNOSIS — I129 Hypertensive chronic kidney disease with stage 1 through stage 4 chronic kidney disease, or unspecified chronic kidney disease: Secondary | ICD-10-CM | POA: Diagnosis not present

## 2021-04-25 DIAGNOSIS — G9341 Metabolic encephalopathy: Secondary | ICD-10-CM | POA: Diagnosis not present

## 2021-04-25 DIAGNOSIS — G839 Paralytic syndrome, unspecified: Secondary | ICD-10-CM | POA: Diagnosis not present

## 2021-04-25 DIAGNOSIS — E039 Hypothyroidism, unspecified: Secondary | ICD-10-CM | POA: Diagnosis not present

## 2021-04-25 DIAGNOSIS — E1122 Type 2 diabetes mellitus with diabetic chronic kidney disease: Secondary | ICD-10-CM | POA: Diagnosis not present

## 2021-04-25 DIAGNOSIS — D631 Anemia in chronic kidney disease: Secondary | ICD-10-CM | POA: Diagnosis not present

## 2021-04-25 DIAGNOSIS — F0280 Dementia in other diseases classified elsewhere without behavioral disturbance: Secondary | ICD-10-CM | POA: Diagnosis not present

## 2021-04-25 DIAGNOSIS — N390 Urinary tract infection, site not specified: Secondary | ICD-10-CM | POA: Diagnosis not present

## 2021-04-26 DIAGNOSIS — E1122 Type 2 diabetes mellitus with diabetic chronic kidney disease: Secondary | ICD-10-CM | POA: Diagnosis not present

## 2021-04-26 DIAGNOSIS — N390 Urinary tract infection, site not specified: Secondary | ICD-10-CM | POA: Diagnosis not present

## 2021-04-26 DIAGNOSIS — F0280 Dementia in other diseases classified elsewhere without behavioral disturbance: Secondary | ICD-10-CM | POA: Diagnosis not present

## 2021-04-26 DIAGNOSIS — N1831 Chronic kidney disease, stage 3a: Secondary | ICD-10-CM | POA: Diagnosis not present

## 2021-04-26 DIAGNOSIS — D631 Anemia in chronic kidney disease: Secondary | ICD-10-CM | POA: Diagnosis not present

## 2021-04-26 DIAGNOSIS — G9341 Metabolic encephalopathy: Secondary | ICD-10-CM | POA: Diagnosis not present

## 2021-04-26 DIAGNOSIS — G839 Paralytic syndrome, unspecified: Secondary | ICD-10-CM | POA: Diagnosis not present

## 2021-04-26 DIAGNOSIS — E039 Hypothyroidism, unspecified: Secondary | ICD-10-CM | POA: Diagnosis not present

## 2021-04-26 DIAGNOSIS — I129 Hypertensive chronic kidney disease with stage 1 through stage 4 chronic kidney disease, or unspecified chronic kidney disease: Secondary | ICD-10-CM | POA: Diagnosis not present

## 2021-04-30 DIAGNOSIS — G839 Paralytic syndrome, unspecified: Secondary | ICD-10-CM | POA: Diagnosis not present

## 2021-04-30 DIAGNOSIS — I69354 Hemiplegia and hemiparesis following cerebral infarction affecting left non-dominant side: Secondary | ICD-10-CM | POA: Diagnosis not present

## 2021-04-30 DIAGNOSIS — G14 Postpolio syndrome: Secondary | ICD-10-CM | POA: Diagnosis not present

## 2021-04-30 DIAGNOSIS — E1122 Type 2 diabetes mellitus with diabetic chronic kidney disease: Secondary | ICD-10-CM | POA: Diagnosis not present

## 2021-04-30 DIAGNOSIS — N1831 Chronic kidney disease, stage 3a: Secondary | ICD-10-CM | POA: Diagnosis not present

## 2021-04-30 DIAGNOSIS — E039 Hypothyroidism, unspecified: Secondary | ICD-10-CM | POA: Diagnosis not present

## 2021-04-30 DIAGNOSIS — I129 Hypertensive chronic kidney disease with stage 1 through stage 4 chronic kidney disease, or unspecified chronic kidney disease: Secondary | ICD-10-CM | POA: Diagnosis not present

## 2021-04-30 DIAGNOSIS — F0280 Dementia in other diseases classified elsewhere without behavioral disturbance: Secondary | ICD-10-CM | POA: Diagnosis not present

## 2021-04-30 DIAGNOSIS — D631 Anemia in chronic kidney disease: Secondary | ICD-10-CM | POA: Diagnosis not present

## 2021-05-01 DIAGNOSIS — E113553 Type 2 diabetes mellitus with stable proliferative diabetic retinopathy, bilateral: Secondary | ICD-10-CM | POA: Diagnosis not present

## 2021-05-01 DIAGNOSIS — G8314 Monoplegia of lower limb affecting left nondominant side: Secondary | ICD-10-CM | POA: Diagnosis not present

## 2021-05-01 DIAGNOSIS — N179 Acute kidney failure, unspecified: Secondary | ICD-10-CM | POA: Diagnosis not present

## 2021-05-01 DIAGNOSIS — I7091 Generalized atherosclerosis: Secondary | ICD-10-CM | POA: Diagnosis not present

## 2021-05-03 DIAGNOSIS — E782 Mixed hyperlipidemia: Secondary | ICD-10-CM | POA: Diagnosis not present

## 2021-05-03 DIAGNOSIS — I10 Essential (primary) hypertension: Secondary | ICD-10-CM | POA: Diagnosis not present

## 2021-05-03 DIAGNOSIS — E1121 Type 2 diabetes mellitus with diabetic nephropathy: Secondary | ICD-10-CM | POA: Diagnosis not present

## 2021-05-04 DIAGNOSIS — G839 Paralytic syndrome, unspecified: Secondary | ICD-10-CM | POA: Diagnosis not present

## 2021-05-04 DIAGNOSIS — I69354 Hemiplegia and hemiparesis following cerebral infarction affecting left non-dominant side: Secondary | ICD-10-CM | POA: Diagnosis not present

## 2021-05-04 DIAGNOSIS — I129 Hypertensive chronic kidney disease with stage 1 through stage 4 chronic kidney disease, or unspecified chronic kidney disease: Secondary | ICD-10-CM | POA: Diagnosis not present

## 2021-05-04 DIAGNOSIS — E039 Hypothyroidism, unspecified: Secondary | ICD-10-CM | POA: Diagnosis not present

## 2021-05-04 DIAGNOSIS — N1831 Chronic kidney disease, stage 3a: Secondary | ICD-10-CM | POA: Diagnosis not present

## 2021-05-04 DIAGNOSIS — G14 Postpolio syndrome: Secondary | ICD-10-CM | POA: Diagnosis not present

## 2021-05-04 DIAGNOSIS — D631 Anemia in chronic kidney disease: Secondary | ICD-10-CM | POA: Diagnosis not present

## 2021-05-04 DIAGNOSIS — F0280 Dementia in other diseases classified elsewhere without behavioral disturbance: Secondary | ICD-10-CM | POA: Diagnosis not present

## 2021-05-04 DIAGNOSIS — E1122 Type 2 diabetes mellitus with diabetic chronic kidney disease: Secondary | ICD-10-CM | POA: Diagnosis not present

## 2021-05-06 DIAGNOSIS — E1122 Type 2 diabetes mellitus with diabetic chronic kidney disease: Secondary | ICD-10-CM | POA: Diagnosis not present

## 2021-05-06 DIAGNOSIS — G14 Postpolio syndrome: Secondary | ICD-10-CM | POA: Diagnosis not present

## 2021-05-06 DIAGNOSIS — N1831 Chronic kidney disease, stage 3a: Secondary | ICD-10-CM | POA: Diagnosis not present

## 2021-05-06 DIAGNOSIS — I129 Hypertensive chronic kidney disease with stage 1 through stage 4 chronic kidney disease, or unspecified chronic kidney disease: Secondary | ICD-10-CM | POA: Diagnosis not present

## 2021-05-06 DIAGNOSIS — F0280 Dementia in other diseases classified elsewhere without behavioral disturbance: Secondary | ICD-10-CM | POA: Diagnosis not present

## 2021-05-06 DIAGNOSIS — I69354 Hemiplegia and hemiparesis following cerebral infarction affecting left non-dominant side: Secondary | ICD-10-CM | POA: Diagnosis not present

## 2021-05-06 DIAGNOSIS — D631 Anemia in chronic kidney disease: Secondary | ICD-10-CM | POA: Diagnosis not present

## 2021-05-06 DIAGNOSIS — E039 Hypothyroidism, unspecified: Secondary | ICD-10-CM | POA: Diagnosis not present

## 2021-05-06 DIAGNOSIS — G839 Paralytic syndrome, unspecified: Secondary | ICD-10-CM | POA: Diagnosis not present

## 2021-05-09 DIAGNOSIS — I129 Hypertensive chronic kidney disease with stage 1 through stage 4 chronic kidney disease, or unspecified chronic kidney disease: Secondary | ICD-10-CM | POA: Diagnosis not present

## 2021-05-09 DIAGNOSIS — N1831 Chronic kidney disease, stage 3a: Secondary | ICD-10-CM | POA: Diagnosis not present

## 2021-05-09 DIAGNOSIS — D631 Anemia in chronic kidney disease: Secondary | ICD-10-CM | POA: Diagnosis not present

## 2021-05-09 DIAGNOSIS — E1122 Type 2 diabetes mellitus with diabetic chronic kidney disease: Secondary | ICD-10-CM | POA: Diagnosis not present

## 2021-05-09 DIAGNOSIS — G839 Paralytic syndrome, unspecified: Secondary | ICD-10-CM | POA: Diagnosis not present

## 2021-05-09 DIAGNOSIS — I69354 Hemiplegia and hemiparesis following cerebral infarction affecting left non-dominant side: Secondary | ICD-10-CM | POA: Diagnosis not present

## 2021-05-09 DIAGNOSIS — E039 Hypothyroidism, unspecified: Secondary | ICD-10-CM | POA: Diagnosis not present

## 2021-05-09 DIAGNOSIS — G14 Postpolio syndrome: Secondary | ICD-10-CM | POA: Diagnosis not present

## 2021-05-09 DIAGNOSIS — F0280 Dementia in other diseases classified elsewhere without behavioral disturbance: Secondary | ICD-10-CM | POA: Diagnosis not present

## 2021-05-11 DIAGNOSIS — G839 Paralytic syndrome, unspecified: Secondary | ICD-10-CM | POA: Diagnosis not present

## 2021-05-11 DIAGNOSIS — D631 Anemia in chronic kidney disease: Secondary | ICD-10-CM | POA: Diagnosis not present

## 2021-05-11 DIAGNOSIS — E039 Hypothyroidism, unspecified: Secondary | ICD-10-CM | POA: Diagnosis not present

## 2021-05-11 DIAGNOSIS — E1122 Type 2 diabetes mellitus with diabetic chronic kidney disease: Secondary | ICD-10-CM | POA: Diagnosis not present

## 2021-05-11 DIAGNOSIS — N1831 Chronic kidney disease, stage 3a: Secondary | ICD-10-CM | POA: Diagnosis not present

## 2021-05-11 DIAGNOSIS — F0280 Dementia in other diseases classified elsewhere without behavioral disturbance: Secondary | ICD-10-CM | POA: Diagnosis not present

## 2021-05-11 DIAGNOSIS — G14 Postpolio syndrome: Secondary | ICD-10-CM | POA: Diagnosis not present

## 2021-05-11 DIAGNOSIS — I69354 Hemiplegia and hemiparesis following cerebral infarction affecting left non-dominant side: Secondary | ICD-10-CM | POA: Diagnosis not present

## 2021-05-11 DIAGNOSIS — I129 Hypertensive chronic kidney disease with stage 1 through stage 4 chronic kidney disease, or unspecified chronic kidney disease: Secondary | ICD-10-CM | POA: Diagnosis not present

## 2021-05-17 DIAGNOSIS — E1122 Type 2 diabetes mellitus with diabetic chronic kidney disease: Secondary | ICD-10-CM | POA: Diagnosis not present

## 2021-05-17 DIAGNOSIS — D631 Anemia in chronic kidney disease: Secondary | ICD-10-CM | POA: Diagnosis not present

## 2021-05-17 DIAGNOSIS — I69354 Hemiplegia and hemiparesis following cerebral infarction affecting left non-dominant side: Secondary | ICD-10-CM | POA: Diagnosis not present

## 2021-05-17 DIAGNOSIS — F0280 Dementia in other diseases classified elsewhere without behavioral disturbance: Secondary | ICD-10-CM | POA: Diagnosis not present

## 2021-05-17 DIAGNOSIS — I129 Hypertensive chronic kidney disease with stage 1 through stage 4 chronic kidney disease, or unspecified chronic kidney disease: Secondary | ICD-10-CM | POA: Diagnosis not present

## 2021-05-17 DIAGNOSIS — N1831 Chronic kidney disease, stage 3a: Secondary | ICD-10-CM | POA: Diagnosis not present

## 2021-05-17 DIAGNOSIS — G839 Paralytic syndrome, unspecified: Secondary | ICD-10-CM | POA: Diagnosis not present

## 2021-05-17 DIAGNOSIS — G14 Postpolio syndrome: Secondary | ICD-10-CM | POA: Diagnosis not present

## 2021-05-17 DIAGNOSIS — E039 Hypothyroidism, unspecified: Secondary | ICD-10-CM | POA: Diagnosis not present

## 2021-05-19 DIAGNOSIS — E1122 Type 2 diabetes mellitus with diabetic chronic kidney disease: Secondary | ICD-10-CM | POA: Diagnosis not present

## 2021-05-19 DIAGNOSIS — D631 Anemia in chronic kidney disease: Secondary | ICD-10-CM | POA: Diagnosis not present

## 2021-05-19 DIAGNOSIS — G839 Paralytic syndrome, unspecified: Secondary | ICD-10-CM | POA: Diagnosis not present

## 2021-05-19 DIAGNOSIS — I69354 Hemiplegia and hemiparesis following cerebral infarction affecting left non-dominant side: Secondary | ICD-10-CM | POA: Diagnosis not present

## 2021-05-19 DIAGNOSIS — I129 Hypertensive chronic kidney disease with stage 1 through stage 4 chronic kidney disease, or unspecified chronic kidney disease: Secondary | ICD-10-CM | POA: Diagnosis not present

## 2021-05-19 DIAGNOSIS — N1831 Chronic kidney disease, stage 3a: Secondary | ICD-10-CM | POA: Diagnosis not present

## 2021-05-19 DIAGNOSIS — E039 Hypothyroidism, unspecified: Secondary | ICD-10-CM | POA: Diagnosis not present

## 2021-05-19 DIAGNOSIS — G14 Postpolio syndrome: Secondary | ICD-10-CM | POA: Diagnosis not present

## 2021-05-19 DIAGNOSIS — F0280 Dementia in other diseases classified elsewhere without behavioral disturbance: Secondary | ICD-10-CM | POA: Diagnosis not present

## 2021-05-23 DIAGNOSIS — E1121 Type 2 diabetes mellitus with diabetic nephropathy: Secondary | ICD-10-CM | POA: Diagnosis not present

## 2021-05-23 DIAGNOSIS — F0280 Dementia in other diseases classified elsewhere without behavioral disturbance: Secondary | ICD-10-CM | POA: Diagnosis not present

## 2021-05-23 DIAGNOSIS — N1831 Chronic kidney disease, stage 3a: Secondary | ICD-10-CM | POA: Diagnosis not present

## 2021-05-23 DIAGNOSIS — I739 Peripheral vascular disease, unspecified: Secondary | ICD-10-CM | POA: Diagnosis not present

## 2021-05-24 DIAGNOSIS — I69354 Hemiplegia and hemiparesis following cerebral infarction affecting left non-dominant side: Secondary | ICD-10-CM | POA: Diagnosis not present

## 2021-05-24 DIAGNOSIS — F0280 Dementia in other diseases classified elsewhere without behavioral disturbance: Secondary | ICD-10-CM | POA: Diagnosis not present

## 2021-05-24 DIAGNOSIS — I129 Hypertensive chronic kidney disease with stage 1 through stage 4 chronic kidney disease, or unspecified chronic kidney disease: Secondary | ICD-10-CM | POA: Diagnosis not present

## 2021-05-24 DIAGNOSIS — G839 Paralytic syndrome, unspecified: Secondary | ICD-10-CM | POA: Diagnosis not present

## 2021-05-24 DIAGNOSIS — E1122 Type 2 diabetes mellitus with diabetic chronic kidney disease: Secondary | ICD-10-CM | POA: Diagnosis not present

## 2021-05-24 DIAGNOSIS — N1831 Chronic kidney disease, stage 3a: Secondary | ICD-10-CM | POA: Diagnosis not present

## 2021-05-24 DIAGNOSIS — G14 Postpolio syndrome: Secondary | ICD-10-CM | POA: Diagnosis not present

## 2021-05-24 DIAGNOSIS — E039 Hypothyroidism, unspecified: Secondary | ICD-10-CM | POA: Diagnosis not present

## 2021-05-24 DIAGNOSIS — D631 Anemia in chronic kidney disease: Secondary | ICD-10-CM | POA: Diagnosis not present

## 2021-05-26 DIAGNOSIS — N1831 Chronic kidney disease, stage 3a: Secondary | ICD-10-CM | POA: Diagnosis not present

## 2021-05-26 DIAGNOSIS — F0280 Dementia in other diseases classified elsewhere without behavioral disturbance: Secondary | ICD-10-CM | POA: Diagnosis not present

## 2021-05-26 DIAGNOSIS — G14 Postpolio syndrome: Secondary | ICD-10-CM | POA: Diagnosis not present

## 2021-05-26 DIAGNOSIS — E1122 Type 2 diabetes mellitus with diabetic chronic kidney disease: Secondary | ICD-10-CM | POA: Diagnosis not present

## 2021-05-26 DIAGNOSIS — G839 Paralytic syndrome, unspecified: Secondary | ICD-10-CM | POA: Diagnosis not present

## 2021-05-26 DIAGNOSIS — I69354 Hemiplegia and hemiparesis following cerebral infarction affecting left non-dominant side: Secondary | ICD-10-CM | POA: Diagnosis not present

## 2021-05-26 DIAGNOSIS — I129 Hypertensive chronic kidney disease with stage 1 through stage 4 chronic kidney disease, or unspecified chronic kidney disease: Secondary | ICD-10-CM | POA: Diagnosis not present

## 2021-05-26 DIAGNOSIS — D631 Anemia in chronic kidney disease: Secondary | ICD-10-CM | POA: Diagnosis not present

## 2021-05-26 DIAGNOSIS — E039 Hypothyroidism, unspecified: Secondary | ICD-10-CM | POA: Diagnosis not present

## 2021-05-27 DIAGNOSIS — I69354 Hemiplegia and hemiparesis following cerebral infarction affecting left non-dominant side: Secondary | ICD-10-CM | POA: Diagnosis not present

## 2021-05-27 DIAGNOSIS — E039 Hypothyroidism, unspecified: Secondary | ICD-10-CM | POA: Diagnosis not present

## 2021-05-27 DIAGNOSIS — F0280 Dementia in other diseases classified elsewhere without behavioral disturbance: Secondary | ICD-10-CM | POA: Diagnosis not present

## 2021-05-30 DIAGNOSIS — N1831 Chronic kidney disease, stage 3a: Secondary | ICD-10-CM | POA: Diagnosis not present

## 2021-05-30 DIAGNOSIS — D631 Anemia in chronic kidney disease: Secondary | ICD-10-CM | POA: Diagnosis not present

## 2021-05-30 DIAGNOSIS — I69354 Hemiplegia and hemiparesis following cerebral infarction affecting left non-dominant side: Secondary | ICD-10-CM | POA: Diagnosis not present

## 2021-05-30 DIAGNOSIS — E1122 Type 2 diabetes mellitus with diabetic chronic kidney disease: Secondary | ICD-10-CM | POA: Diagnosis not present

## 2021-05-30 DIAGNOSIS — G14 Postpolio syndrome: Secondary | ICD-10-CM | POA: Diagnosis not present

## 2021-05-30 DIAGNOSIS — F0280 Dementia in other diseases classified elsewhere without behavioral disturbance: Secondary | ICD-10-CM | POA: Diagnosis not present

## 2021-05-30 DIAGNOSIS — G839 Paralytic syndrome, unspecified: Secondary | ICD-10-CM | POA: Diagnosis not present

## 2021-05-30 DIAGNOSIS — I129 Hypertensive chronic kidney disease with stage 1 through stage 4 chronic kidney disease, or unspecified chronic kidney disease: Secondary | ICD-10-CM | POA: Diagnosis not present

## 2021-05-30 DIAGNOSIS — E039 Hypothyroidism, unspecified: Secondary | ICD-10-CM | POA: Diagnosis not present

## 2021-05-31 DIAGNOSIS — F0280 Dementia in other diseases classified elsewhere without behavioral disturbance: Secondary | ICD-10-CM | POA: Diagnosis not present

## 2021-05-31 DIAGNOSIS — G839 Paralytic syndrome, unspecified: Secondary | ICD-10-CM | POA: Diagnosis not present

## 2021-05-31 DIAGNOSIS — E039 Hypothyroidism, unspecified: Secondary | ICD-10-CM | POA: Diagnosis not present

## 2021-05-31 DIAGNOSIS — E1122 Type 2 diabetes mellitus with diabetic chronic kidney disease: Secondary | ICD-10-CM | POA: Diagnosis not present

## 2021-05-31 DIAGNOSIS — G14 Postpolio syndrome: Secondary | ICD-10-CM | POA: Diagnosis not present

## 2021-05-31 DIAGNOSIS — N1831 Chronic kidney disease, stage 3a: Secondary | ICD-10-CM | POA: Diagnosis not present

## 2021-05-31 DIAGNOSIS — E1121 Type 2 diabetes mellitus with diabetic nephropathy: Secondary | ICD-10-CM | POA: Diagnosis not present

## 2021-05-31 DIAGNOSIS — I69354 Hemiplegia and hemiparesis following cerebral infarction affecting left non-dominant side: Secondary | ICD-10-CM | POA: Diagnosis not present

## 2021-05-31 DIAGNOSIS — I129 Hypertensive chronic kidney disease with stage 1 through stage 4 chronic kidney disease, or unspecified chronic kidney disease: Secondary | ICD-10-CM | POA: Diagnosis not present

## 2021-05-31 DIAGNOSIS — D631 Anemia in chronic kidney disease: Secondary | ICD-10-CM | POA: Diagnosis not present

## 2021-06-01 DIAGNOSIS — F0280 Dementia in other diseases classified elsewhere without behavioral disturbance: Secondary | ICD-10-CM | POA: Diagnosis not present

## 2021-06-01 DIAGNOSIS — I10 Essential (primary) hypertension: Secondary | ICD-10-CM | POA: Diagnosis not present

## 2021-06-01 DIAGNOSIS — E782 Mixed hyperlipidemia: Secondary | ICD-10-CM | POA: Diagnosis not present

## 2021-06-01 DIAGNOSIS — E11319 Type 2 diabetes mellitus with unspecified diabetic retinopathy without macular edema: Secondary | ICD-10-CM | POA: Diagnosis not present

## 2021-06-01 DIAGNOSIS — N1831 Chronic kidney disease, stage 3a: Secondary | ICD-10-CM | POA: Diagnosis not present

## 2021-06-01 DIAGNOSIS — I739 Peripheral vascular disease, unspecified: Secondary | ICD-10-CM | POA: Diagnosis not present

## 2021-06-01 DIAGNOSIS — G301 Alzheimer's disease with late onset: Secondary | ICD-10-CM | POA: Diagnosis not present

## 2021-06-01 DIAGNOSIS — Z23 Encounter for immunization: Secondary | ICD-10-CM | POA: Diagnosis not present

## 2021-06-01 DIAGNOSIS — I69341 Monoplegia of lower limb following cerebral infarction affecting right dominant side: Secondary | ICD-10-CM | POA: Diagnosis not present

## 2021-06-01 DIAGNOSIS — Z794 Long term (current) use of insulin: Secondary | ICD-10-CM | POA: Diagnosis not present

## 2021-06-02 DIAGNOSIS — E039 Hypothyroidism, unspecified: Secondary | ICD-10-CM | POA: Diagnosis not present

## 2021-06-02 DIAGNOSIS — G14 Postpolio syndrome: Secondary | ICD-10-CM | POA: Diagnosis not present

## 2021-06-02 DIAGNOSIS — I69354 Hemiplegia and hemiparesis following cerebral infarction affecting left non-dominant side: Secondary | ICD-10-CM | POA: Diagnosis not present

## 2021-06-02 DIAGNOSIS — I129 Hypertensive chronic kidney disease with stage 1 through stage 4 chronic kidney disease, or unspecified chronic kidney disease: Secondary | ICD-10-CM | POA: Diagnosis not present

## 2021-06-02 DIAGNOSIS — F0280 Dementia in other diseases classified elsewhere without behavioral disturbance: Secondary | ICD-10-CM | POA: Diagnosis not present

## 2021-06-02 DIAGNOSIS — D631 Anemia in chronic kidney disease: Secondary | ICD-10-CM | POA: Diagnosis not present

## 2021-06-02 DIAGNOSIS — G839 Paralytic syndrome, unspecified: Secondary | ICD-10-CM | POA: Diagnosis not present

## 2021-06-02 DIAGNOSIS — E1122 Type 2 diabetes mellitus with diabetic chronic kidney disease: Secondary | ICD-10-CM | POA: Diagnosis not present

## 2021-06-02 DIAGNOSIS — N1831 Chronic kidney disease, stage 3a: Secondary | ICD-10-CM | POA: Diagnosis not present

## 2021-06-07 DIAGNOSIS — G14 Postpolio syndrome: Secondary | ICD-10-CM | POA: Diagnosis not present

## 2021-06-07 DIAGNOSIS — I69354 Hemiplegia and hemiparesis following cerebral infarction affecting left non-dominant side: Secondary | ICD-10-CM | POA: Diagnosis not present

## 2021-06-07 DIAGNOSIS — F0280 Dementia in other diseases classified elsewhere without behavioral disturbance: Secondary | ICD-10-CM | POA: Diagnosis not present

## 2021-06-07 DIAGNOSIS — E1122 Type 2 diabetes mellitus with diabetic chronic kidney disease: Secondary | ICD-10-CM | POA: Diagnosis not present

## 2021-06-07 DIAGNOSIS — D631 Anemia in chronic kidney disease: Secondary | ICD-10-CM | POA: Diagnosis not present

## 2021-06-07 DIAGNOSIS — E039 Hypothyroidism, unspecified: Secondary | ICD-10-CM | POA: Diagnosis not present

## 2021-06-07 DIAGNOSIS — G839 Paralytic syndrome, unspecified: Secondary | ICD-10-CM | POA: Diagnosis not present

## 2021-06-07 DIAGNOSIS — I129 Hypertensive chronic kidney disease with stage 1 through stage 4 chronic kidney disease, or unspecified chronic kidney disease: Secondary | ICD-10-CM | POA: Diagnosis not present

## 2021-06-07 DIAGNOSIS — N1831 Chronic kidney disease, stage 3a: Secondary | ICD-10-CM | POA: Diagnosis not present

## 2021-06-09 DIAGNOSIS — I69354 Hemiplegia and hemiparesis following cerebral infarction affecting left non-dominant side: Secondary | ICD-10-CM | POA: Diagnosis not present

## 2021-06-09 DIAGNOSIS — N1831 Chronic kidney disease, stage 3a: Secondary | ICD-10-CM | POA: Diagnosis not present

## 2021-06-09 DIAGNOSIS — D631 Anemia in chronic kidney disease: Secondary | ICD-10-CM | POA: Diagnosis not present

## 2021-06-09 DIAGNOSIS — I129 Hypertensive chronic kidney disease with stage 1 through stage 4 chronic kidney disease, or unspecified chronic kidney disease: Secondary | ICD-10-CM | POA: Diagnosis not present

## 2021-06-09 DIAGNOSIS — E1122 Type 2 diabetes mellitus with diabetic chronic kidney disease: Secondary | ICD-10-CM | POA: Diagnosis not present

## 2021-06-09 DIAGNOSIS — G839 Paralytic syndrome, unspecified: Secondary | ICD-10-CM | POA: Diagnosis not present

## 2021-06-09 DIAGNOSIS — G14 Postpolio syndrome: Secondary | ICD-10-CM | POA: Diagnosis not present

## 2021-06-09 DIAGNOSIS — F0280 Dementia in other diseases classified elsewhere without behavioral disturbance: Secondary | ICD-10-CM | POA: Diagnosis not present

## 2021-06-09 DIAGNOSIS — E039 Hypothyroidism, unspecified: Secondary | ICD-10-CM | POA: Diagnosis not present

## 2021-06-14 DIAGNOSIS — I129 Hypertensive chronic kidney disease with stage 1 through stage 4 chronic kidney disease, or unspecified chronic kidney disease: Secondary | ICD-10-CM | POA: Diagnosis not present

## 2021-06-14 DIAGNOSIS — F0280 Dementia in other diseases classified elsewhere without behavioral disturbance: Secondary | ICD-10-CM | POA: Diagnosis not present

## 2021-06-14 DIAGNOSIS — G14 Postpolio syndrome: Secondary | ICD-10-CM | POA: Diagnosis not present

## 2021-06-14 DIAGNOSIS — D631 Anemia in chronic kidney disease: Secondary | ICD-10-CM | POA: Diagnosis not present

## 2021-06-14 DIAGNOSIS — N1831 Chronic kidney disease, stage 3a: Secondary | ICD-10-CM | POA: Diagnosis not present

## 2021-06-14 DIAGNOSIS — G839 Paralytic syndrome, unspecified: Secondary | ICD-10-CM | POA: Diagnosis not present

## 2021-06-14 DIAGNOSIS — E039 Hypothyroidism, unspecified: Secondary | ICD-10-CM | POA: Diagnosis not present

## 2021-06-14 DIAGNOSIS — E1122 Type 2 diabetes mellitus with diabetic chronic kidney disease: Secondary | ICD-10-CM | POA: Diagnosis not present

## 2021-06-14 DIAGNOSIS — I69354 Hemiplegia and hemiparesis following cerebral infarction affecting left non-dominant side: Secondary | ICD-10-CM | POA: Diagnosis not present

## 2021-06-16 DIAGNOSIS — I129 Hypertensive chronic kidney disease with stage 1 through stage 4 chronic kidney disease, or unspecified chronic kidney disease: Secondary | ICD-10-CM | POA: Diagnosis not present

## 2021-06-16 DIAGNOSIS — F0280 Dementia in other diseases classified elsewhere without behavioral disturbance: Secondary | ICD-10-CM | POA: Diagnosis not present

## 2021-06-16 DIAGNOSIS — E1122 Type 2 diabetes mellitus with diabetic chronic kidney disease: Secondary | ICD-10-CM | POA: Diagnosis not present

## 2021-06-16 DIAGNOSIS — G839 Paralytic syndrome, unspecified: Secondary | ICD-10-CM | POA: Diagnosis not present

## 2021-06-16 DIAGNOSIS — D631 Anemia in chronic kidney disease: Secondary | ICD-10-CM | POA: Diagnosis not present

## 2021-06-16 DIAGNOSIS — I69354 Hemiplegia and hemiparesis following cerebral infarction affecting left non-dominant side: Secondary | ICD-10-CM | POA: Diagnosis not present

## 2021-06-16 DIAGNOSIS — E039 Hypothyroidism, unspecified: Secondary | ICD-10-CM | POA: Diagnosis not present

## 2021-06-16 DIAGNOSIS — G14 Postpolio syndrome: Secondary | ICD-10-CM | POA: Diagnosis not present

## 2021-06-16 DIAGNOSIS — N1831 Chronic kidney disease, stage 3a: Secondary | ICD-10-CM | POA: Diagnosis not present

## 2021-06-21 DIAGNOSIS — D631 Anemia in chronic kidney disease: Secondary | ICD-10-CM | POA: Diagnosis not present

## 2021-06-21 DIAGNOSIS — G14 Postpolio syndrome: Secondary | ICD-10-CM | POA: Diagnosis not present

## 2021-06-21 DIAGNOSIS — I69354 Hemiplegia and hemiparesis following cerebral infarction affecting left non-dominant side: Secondary | ICD-10-CM | POA: Diagnosis not present

## 2021-06-21 DIAGNOSIS — F0280 Dementia in other diseases classified elsewhere without behavioral disturbance: Secondary | ICD-10-CM | POA: Diagnosis not present

## 2021-06-21 DIAGNOSIS — G839 Paralytic syndrome, unspecified: Secondary | ICD-10-CM | POA: Diagnosis not present

## 2021-06-21 DIAGNOSIS — I129 Hypertensive chronic kidney disease with stage 1 through stage 4 chronic kidney disease, or unspecified chronic kidney disease: Secondary | ICD-10-CM | POA: Diagnosis not present

## 2021-06-21 DIAGNOSIS — E1122 Type 2 diabetes mellitus with diabetic chronic kidney disease: Secondary | ICD-10-CM | POA: Diagnosis not present

## 2021-06-21 DIAGNOSIS — E039 Hypothyroidism, unspecified: Secondary | ICD-10-CM | POA: Diagnosis not present

## 2021-06-21 DIAGNOSIS — N1831 Chronic kidney disease, stage 3a: Secondary | ICD-10-CM | POA: Diagnosis not present

## 2021-06-23 DIAGNOSIS — F0280 Dementia in other diseases classified elsewhere without behavioral disturbance: Secondary | ICD-10-CM | POA: Diagnosis not present

## 2021-06-23 DIAGNOSIS — N1831 Chronic kidney disease, stage 3a: Secondary | ICD-10-CM | POA: Diagnosis not present

## 2021-06-23 DIAGNOSIS — D631 Anemia in chronic kidney disease: Secondary | ICD-10-CM | POA: Diagnosis not present

## 2021-06-23 DIAGNOSIS — E039 Hypothyroidism, unspecified: Secondary | ICD-10-CM | POA: Diagnosis not present

## 2021-06-23 DIAGNOSIS — G14 Postpolio syndrome: Secondary | ICD-10-CM | POA: Diagnosis not present

## 2021-06-23 DIAGNOSIS — G839 Paralytic syndrome, unspecified: Secondary | ICD-10-CM | POA: Diagnosis not present

## 2021-06-23 DIAGNOSIS — E1122 Type 2 diabetes mellitus with diabetic chronic kidney disease: Secondary | ICD-10-CM | POA: Diagnosis not present

## 2021-06-23 DIAGNOSIS — I129 Hypertensive chronic kidney disease with stage 1 through stage 4 chronic kidney disease, or unspecified chronic kidney disease: Secondary | ICD-10-CM | POA: Diagnosis not present

## 2021-06-23 DIAGNOSIS — I69354 Hemiplegia and hemiparesis following cerebral infarction affecting left non-dominant side: Secondary | ICD-10-CM | POA: Diagnosis not present

## 2021-06-27 DIAGNOSIS — N3000 Acute cystitis without hematuria: Secondary | ICD-10-CM | POA: Diagnosis not present

## 2021-06-27 DIAGNOSIS — E1122 Type 2 diabetes mellitus with diabetic chronic kidney disease: Secondary | ICD-10-CM | POA: Diagnosis not present

## 2021-06-27 DIAGNOSIS — E039 Hypothyroidism, unspecified: Secondary | ICD-10-CM | POA: Diagnosis not present

## 2021-06-27 DIAGNOSIS — G839 Paralytic syndrome, unspecified: Secondary | ICD-10-CM | POA: Diagnosis not present

## 2021-06-27 DIAGNOSIS — I129 Hypertensive chronic kidney disease with stage 1 through stage 4 chronic kidney disease, or unspecified chronic kidney disease: Secondary | ICD-10-CM | POA: Diagnosis not present

## 2021-06-27 DIAGNOSIS — I69354 Hemiplegia and hemiparesis following cerebral infarction affecting left non-dominant side: Secondary | ICD-10-CM | POA: Diagnosis not present

## 2021-06-27 DIAGNOSIS — D631 Anemia in chronic kidney disease: Secondary | ICD-10-CM | POA: Diagnosis not present

## 2021-06-27 DIAGNOSIS — G14 Postpolio syndrome: Secondary | ICD-10-CM | POA: Diagnosis not present

## 2021-06-27 DIAGNOSIS — N1831 Chronic kidney disease, stage 3a: Secondary | ICD-10-CM | POA: Diagnosis not present

## 2021-06-27 DIAGNOSIS — F0280 Dementia in other diseases classified elsewhere without behavioral disturbance: Secondary | ICD-10-CM | POA: Diagnosis not present

## 2021-06-30 DIAGNOSIS — E1121 Type 2 diabetes mellitus with diabetic nephropathy: Secondary | ICD-10-CM | POA: Diagnosis not present

## 2021-07-05 DIAGNOSIS — E119 Type 2 diabetes mellitus without complications: Secondary | ICD-10-CM | POA: Diagnosis not present

## 2021-07-05 DIAGNOSIS — N1831 Chronic kidney disease, stage 3a: Secondary | ICD-10-CM | POA: Diagnosis not present

## 2021-07-15 DIAGNOSIS — R972 Elevated prostate specific antigen [PSA]: Secondary | ICD-10-CM | POA: Diagnosis not present

## 2021-07-22 DIAGNOSIS — R300 Dysuria: Secondary | ICD-10-CM | POA: Diagnosis not present

## 2021-07-22 DIAGNOSIS — N481 Balanitis: Secondary | ICD-10-CM | POA: Diagnosis not present

## 2021-07-22 DIAGNOSIS — R972 Elevated prostate specific antigen [PSA]: Secondary | ICD-10-CM | POA: Diagnosis not present

## 2021-07-22 DIAGNOSIS — N3000 Acute cystitis without hematuria: Secondary | ICD-10-CM | POA: Diagnosis not present

## 2021-08-11 DIAGNOSIS — N1831 Chronic kidney disease, stage 3a: Secondary | ICD-10-CM | POA: Diagnosis not present

## 2021-08-11 DIAGNOSIS — E789 Disorder of lipoprotein metabolism, unspecified: Secondary | ICD-10-CM | POA: Diagnosis not present

## 2021-08-11 DIAGNOSIS — E118 Type 2 diabetes mellitus with unspecified complications: Secondary | ICD-10-CM | POA: Diagnosis not present

## 2021-08-16 DIAGNOSIS — E782 Mixed hyperlipidemia: Secondary | ICD-10-CM | POA: Diagnosis not present

## 2021-08-16 DIAGNOSIS — E1121 Type 2 diabetes mellitus with diabetic nephropathy: Secondary | ICD-10-CM | POA: Diagnosis not present

## 2021-08-28 ENCOUNTER — Emergency Department (HOSPITAL_COMMUNITY)
Admission: EM | Admit: 2021-08-28 | Discharge: 2021-08-28 | Disposition: A | Discharge status: Home / Self Care | Payer: Medicare HMO | Attending: Emergency Medicine | Admitting: Emergency Medicine

## 2021-08-28 ENCOUNTER — Encounter (HOSPITAL_COMMUNITY): Payer: Self-pay

## 2021-08-28 ENCOUNTER — Other Ambulatory Visit: Payer: Self-pay

## 2021-08-28 DIAGNOSIS — N390 Urinary tract infection, site not specified: Secondary | ICD-10-CM | POA: Diagnosis not present

## 2021-08-28 DIAGNOSIS — R748 Abnormal levels of other serum enzymes: Secondary | ICD-10-CM

## 2021-08-28 DIAGNOSIS — I129 Hypertensive chronic kidney disease with stage 1 through stage 4 chronic kidney disease, or unspecified chronic kidney disease: Secondary | ICD-10-CM | POA: Diagnosis not present

## 2021-08-28 DIAGNOSIS — R7989 Other specified abnormal findings of blood chemistry: Secondary | ICD-10-CM | POA: Insufficient documentation

## 2021-08-28 DIAGNOSIS — Z7982 Long term (current) use of aspirin: Secondary | ICD-10-CM | POA: Diagnosis not present

## 2021-08-28 DIAGNOSIS — R300 Dysuria: Secondary | ICD-10-CM

## 2021-08-28 DIAGNOSIS — R309 Painful micturition, unspecified: Secondary | ICD-10-CM | POA: Diagnosis present

## 2021-08-28 DIAGNOSIS — N189 Chronic kidney disease, unspecified: Secondary | ICD-10-CM | POA: Insufficient documentation

## 2021-08-28 DIAGNOSIS — N3090 Cystitis, unspecified without hematuria: Secondary | ICD-10-CM | POA: Diagnosis not present

## 2021-08-28 LAB — BASIC METABOLIC PANEL
Anion gap: 5 (ref 5–15)
BUN: 23 mg/dL (ref 8–23)
CO2: 28 mmol/L (ref 22–32)
Calcium: 8.6 mg/dL — ABNORMAL LOW (ref 8.9–10.3)
Chloride: 103 mmol/L (ref 98–111)
Creatinine, Ser: 1.8 mg/dL — ABNORMAL HIGH (ref 0.61–1.24)
GFR, Estimated: 37 mL/min — ABNORMAL LOW (ref >60)
Glucose, Bld: 170 mg/dL — ABNORMAL HIGH (ref 70–99)
Potassium: 4.2 mmol/L (ref 3.5–5.1)
Sodium: 136 mmol/L (ref 135–145)

## 2021-08-28 LAB — URINALYSIS, ROUTINE W REFLEX MICROSCOPIC
Bilirubin Urine: NEGATIVE
Glucose, UA: 50 mg/dL — AB
Ketones, ur: NEGATIVE mg/dL
Nitrite: NEGATIVE
Protein, ur: 30 mg/dL — AB
Specific Gravity, Urine: 1.003 — ABNORMAL LOW (ref 1.005–1.030)
WBC, UA: >50 WBC/hpf — ABNORMAL HIGH (ref 0–5)
pH: 7 (ref 5.0–8.0)

## 2021-08-28 LAB — CBC WITH DIFFERENTIAL/PLATELET
Abs Immature Granulocytes: 0.01 10*3/uL (ref 0.00–0.07)
Basophils Absolute: 0 10*3/uL (ref 0.0–0.1)
Basophils Relative: 0 %
Eosinophils Absolute: 0.1 10*3/uL (ref 0.0–0.5)
Eosinophils Relative: 2 %
HCT: 40.2 % (ref 39.0–52.0)
Hemoglobin: 13.2 g/dL (ref 13.0–17.0)
Immature Granulocytes: 0 %
Lymphocytes Relative: 26 %
Lymphs Abs: 1.1 10*3/uL (ref 0.7–4.0)
MCH: 32.5 pg (ref 26.0–34.0)
MCHC: 32.8 g/dL (ref 30.0–36.0)
MCV: 99 fL (ref 80.0–100.0)
Monocytes Absolute: 0.5 10*3/uL (ref 0.1–1.0)
Monocytes Relative: 10 %
Neutro Abs: 2.7 10*3/uL (ref 1.7–7.7)
Neutrophils Relative %: 62 %
Platelets: 163 10*3/uL (ref 150–400)
RBC: 4.06 MIL/uL — ABNORMAL LOW (ref 4.22–5.81)
RDW: 14.7 % (ref 11.5–15.5)
WBC: 4.5 10*3/uL (ref 4.0–10.5)
nRBC: 0 % (ref 0.0–0.2)

## 2021-08-28 MED ORDER — ONDANSETRON 8 MG PO TBDP: 8.0000 mg | ORAL_TABLET | Freq: Three times a day (TID) | ORAL | 0 refills | Status: DC | PRN

## 2021-08-28 MED ORDER — CEPHALEXIN 500 MG PO CAPS: 500.0000 mg | ORAL_CAPSULE | Freq: Three times a day (TID) | ORAL | 0 refills | Status: DC

## 2021-08-28 MED ORDER — SODIUM CHLORIDE 0.9 % IV SOLN
1.0000 g | Freq: Once | INTRAVENOUS | Status: AC
  Administered 2021-08-28: 1 g via INTRAVENOUS
  Filled 2021-08-28: qty 10

## 2021-08-28 NOTE — Discharge Instructions (Addendum)
You are seen in the ER for burning with urination. ? ?Work-up is consistent with bladder infection.  Antibiotics have been prescribed. ? ?On lab, your kidney function is slightly elevated compared to baseline.  We recommend that you follow-up with your primary care doctor in 7 days for repeat blood work to make sure your kidney function is stabilized. ? ?Please return to the ER if your symptoms worsen; you have increased pain, fevers, chills, inability to keep any medications down, confusion. Otherwise see the outpatient doctor as requested. ?

## 2021-08-28 NOTE — ED Triage Notes (Signed)
Pt endorses painful urination and pain in abdomen upon urinating. Pt states this has been going on for a month, but has become worse. Pt is non-ambulatory at baseline per wife. ?

## 2021-08-28 NOTE — ED Provider Notes (Signed)
[facility]-EMERGENCY DEPT Provider Note   CSN: [id number] Arrival date & time: 08/28/21  4098     History  Chief Complaint  Patient presents with   Dysuria    Mike Mack is a 86 y.o. male.  HPI     86 year old male comes in with chief complaint of burning with urination. Patient has history of UTI in the past and indicates that for the last 3 days he has been having burning with urination, increased urinary frequency, lower abdominal pain.  He is denying any nausea or vomiting.  He is denying any fevers or chills.  Patient here with his wife.  No discharge.  Home Medications Prior to Admission medications   Medication Sig Start Date End Date Taking? Authorizing Provider  cephALEXin (KEFLEX) 500 MG capsule Take 1 capsule (500 mg total) by mouth 3 (three) times daily. 08/28/21  Yes Adamae Ricklefs, MD  ondansetron (ZOFRAN-ODT) 8 MG disintegrating tablet Take 1 tablet (8 mg total) by mouth every 8 (eight) hours as needed for nausea. 08/28/21  Yes Derwood Kaplan, MD  acetaminophen (TYLENOL) 325 MG tablet Take 2 tablets (650 mg total) by mouth every 6 (six) hours as needed for mild pain (or Fever >/= 101). 11/27/20   Calvert Cantor, MD  amLODipine (NORVASC) 5 MG tablet Take 5 mg by mouth daily. 12/17/20   [provider]  aspirin EC 81 MG tablet Take 1 tablet (81 mg total) by mouth daily. 12/16/20   Rolly Salter, MD  atorvastatin (LIPITOR) 20 MG tablet Take 20 mg by mouth every evening.  11/03/13   [provider]  B Complex Vitamins (B COMPLEX PO) Take 1 tablet by mouth at bedtime.    [provider]  brimonidine (ALPHAGAN) 0.2 % ophthalmic solution Place 1 drop into both eyes 2 (two) times daily. 12/28/19   [provider]  cholecalciferol (VITAMIN D3) 10 MCG (400 UNIT) TABS tablet Take 400 Units by mouth daily.    [provider]  dorzolamide-timolol (COSOPT) 22.3-6.8 MG/ML ophthalmic solution Place 1 drop into both  eyes 2 (two) times daily. 03/04/20   [provider]  feeding supplement, GLUCERNA SHAKE, (GLUCERNA SHAKE) LIQD Take 237 mLs by mouth 3 (three) times daily between meals. Patient taking differently: Take 237 mLs by mouth 2 (two) times daily between meals. 03/25/20   Lanae Boast, MD  gabapentin (NEURONTIN) 600 MG tablet Take 600 mg by mouth 2 (two) times daily as needed for headache. 01/21/21   [provider]  latanoprost (XALATAN) 0.005 % ophthalmic solution Place 1 drop into both eyes at bedtime.  11/17/14   [provider]  levothyroxine (SYNTHROID) 88 MCG tablet Take 88 mcg by mouth at bedtime.  04/21/11   [provider]  memantine (NAMENDA) 10 MG tablet Take 10 mg by mouth 2 (two) times daily. 11/04/20   [provider]  metoprolol tartrate (LOPRESSOR) 25 MG tablet Take 1 tablet (25 mg total) by mouth 2 (two) times daily. 12/09/20   Rolly Salter, MD  Multiple Vitamin (MULTIVITAMIN WITH MINERALS) TABS tablet Take 1 tablet by mouth daily. 09/06/13   Christiane Ha, MD  olmesartan (BENICAR) 20 MG tablet Take 20 mg by mouth daily. 02/13/21   [provider]  pantoprazole (PROTONIX) 40 MG tablet Take 1 tablet (40 mg total) by mouth 2 (two) times daily before a meal. 12/09/20   Rolly Salter, MD  pioglitazone (ACTOS) 30 MG tablet Take 30 mg by mouth daily. 12/16/20  [provider]  polyethylene glycol (MIRALAX / GLYCOLAX) packet Take 17 g by mouth daily. Patient taking differently: Take 17 g by mouth daily as needed for mild constipation. 09/06/13   Christiane Ha, MD  RHOPRESSA 0.02 % SOLN Place 1 drop into both eyes at bedtime. 03/05/20   [provider]  Tamsulosin HCl (FLOMAX) 0.4 MG CAPS Take 0.4 mg by mouth in the morning and at bedtime.    [provider]      Allergies    Metformin and related, Other, and Soliqua [insulin glargine-lixisenatide]    Review of Systems   Review of Systems  All other systems  reviewed and are negative.  Physical Exam Updated Vital Signs BP (!) 144/74   Pulse (!) 57   Temp (!) 97.5 F (36.4 C) (Oral)   Resp 10   SpO2 98%  Physical Exam Vitals and nursing note reviewed.  Constitutional:      Appearance: He is well-developed.  HENT:     Head: Atraumatic.  Cardiovascular:     Rate and Rhythm: Normal rate.  Pulmonary:     Effort: Pulmonary effort is normal.  Abdominal:     Tenderness: There is no abdominal tenderness.  Musculoskeletal:     Cervical back: Neck supple.  Skin:    General: Skin is warm.  Neurological:     Mental Status: He is alert and oriented to person, place, and time.    ED Results / Procedures / Treatments   Labs (all labs ordered are listed, but only abnormal results are displayed) Labs Reviewed  URINALYSIS, ROUTINE W REFLEX MICROSCOPIC - Abnormal; Notable for the following components:      Result Value   Color, Urine STRAW (*)    Specific Gravity, Urine 1.003 (*)    Glucose, UA 50 (*)    Hgb urine dipstick MODERATE (*)    Protein, ur 30 (*)    Leukocytes,Ua LARGE (*)    WBC, UA >50 (*)    Bacteria, UA RARE (*)    All other components within normal limits  CBC WITH DIFFERENTIAL/PLATELET - Abnormal; Notable for the following components:   RBC 4.06 (*)    All other components within normal limits  BASIC METABOLIC PANEL - Abnormal; Notable for the following components:   Glucose, Bld 170 (*)    Creatinine, Ser 1.80 (*)    Calcium 8.6 (*)    GFR, Estimated 37 (*)    All other components within normal limits  URINE CULTURE    EKG None  Radiology No results found.  Procedures Procedures    Medications Ordered in ED Medications  cefTRIAXone (ROCEPHIN) 1 g in sodium chloride 0.9 % 100 mL IVPB (0 g Intravenous Stopped 08/28/21 1030)    ED Course/ Medical Decision Making/ A&P Clinical Course as of 08/28/21 1453  Sun Aug 28, 2021  1450 Urinalysis, Routine w reflex microscopic Urine, Clean Catch(!) Shows clear  evidence of infection.  Urine culture sent.  IV ceftriaxone already ordered. [AN]  1452 CBC with Differential(!) No elevated white count. As discussed earlier, CT not indicated.  Patient reassessed, unchanged exam. [AN]  1453 Creatinine(!): 1.80 Creatinine is slightly elevated.  Patient received IV fluids here.  Advised recheck of the creatinine with PCP.  Has known history of CKD. [AN]    Clinical Course User Index [AN] Derwood Kaplan, MD  Medical Decision Making Amount and/or Complexity of Data Reviewed Labs: ordered.  Risk Prescription drug management.    This patient presents to the ED with chief complaint(s) of burning with urination with pertinent past medical history of enterococci UTI,'s stroke, diabetes which further complicates the presenting complaint. The complaint involves an extensive differential diagnosis and treatment options and also carries with it a high risk of complications and morbidity.    Patient has 0 SIRS criteria upon arrival. He has no peritoneal findings on exam. I reviewed patient's prior urine cultures, he had enterococci that was sensitive to ceftriaxone.  The differential diagnosis includes bladder infection, pyelonephritis, perinephric abscess, kidney stone.  The initial plan is to basic blood work and also get urine analysis along with urine culture  Clinical suspicion for UTI is high, suspicion for sepsis is low. Based on the exam, nontoxic-appearance and lack of systemic symptoms, suspicion for surgical findings including perinephric abscess is quite low.  CT considered, but will be ordered only if urine is negative for infection.  Additional history obtained: Additional history obtained from patient's spouse who is at the bedside Records reviewed  previous admission/discharge records and also urine cultures    Final Clinical Impression(s) / ED Diagnoses Final diagnoses:  Cystitis  Dysuria  Elevated creatine  kinase  Chronic kidney disease, unspecified CKD stage    Rx / DC Orders ED Discharge Orders          Ordered    cephALEXin (KEFLEX) 500 MG capsule  3 times daily        08/28/21 1328    ondansetron (ZOFRAN-ODT) 8 MG disintegrating tablet  Every 8 hours PRN        08/28/21 1328              Derwood Kaplan, MD 08/28/21 1453

## 2021-08-30 LAB — URINE CULTURE: Culture: 20000 — AB

## 2021-08-31 ENCOUNTER — Telehealth: Payer: Self-pay | Admitting: Emergency Medicine

## 2021-08-31 NOTE — Telephone Encounter (Signed)
Post ED Visit - Positive Culture Follow-up: Successful Patient Follow-Up ? ?Culture assessed and recommendations reviewed by: ? ?'[]'$  Elenor Quinones, Pharm.D. ?'[]'$  Heide Guile, Pharm.D., BCPS AQ-ID ?'[]'$  Parks Neptune, Pharm.D., BCPS ?'[x]'$  Alycia Rossetti, Pharm.D., BCPS ?'[]'$  [place], Pharm.D., BCPS, AAHIVP ?'[]'$  Legrand Como, Pharm.D., BCPS, AAHIVP ?'[]'$  Salome Arnt, PharmD, BCPS ?'[]'$  Johnnette Gourd, PharmD, BCPS ?'[]'$  Hughes Better, PharmD, BCPS ?'[]'$  Leeroy Cha, PharmD ? ?Positive urine culture ? ?'[]'$  Patient discharged without antimicrobial prescription and treatment is now indicated ?'[]'$  Organism is resistant to prescribed ED discharge antimicrobial ?'[]'$  Patient with positive blood cultures ? ?Changes discussed with ED provider: Benedetto Goad [facility] ?New antibiotic prescription symptom check, d/c keflex, if still symptomatic, start cipro '250mg'$  po bid x 7 days ? ?Attempting to contact patient ? ?Mike Mack ?08/31/2021, 9:59 AM ? ?  ?

## 2021-08-31 NOTE — Progress Notes (Signed)
ED Antimicrobial Stewardship Positive Culture Follow Up  ? ?Mike Mack is an 86 y.o. male who presented to [facility] on 08/28/2021 with a chief complaint of  ?Chief Complaint  ?Patient presents with  ? Dysuria  ? ? ?Recent Results (from the past 720 hour(s))  ?Urine Culture     Status: Abnormal  ? Collection Time: 08/28/21 10:00 AM  ? Specimen: Urine, Clean Catch  ?Result Value Ref Range Status  ? Specimen Description   Final  ?  URINE, CLEAN CATCH ?Performed at [facility], [place] [address] [place], [place] [address] ?  ? Special Requests   Final  ?  NONE ?Performed at [facility] [place], [place] [address], [place], [place] [address] ?  ? Culture 20,000 COLONIES/mL PSEUDOMONAS AERUGINOSA (A)  Final  ? Report Status 08/30/2021 FINAL  Final  ? Organism ID, Bacteria PSEUDOMONAS AERUGINOSA (A)  Final  ?    Susceptibility  ? Pseudomonas aeruginosa - MIC*  ?  CEFTAZIDIME 4 SENSITIVE Sensitive   ?  CIPROFLOXACIN <=0.25 SENSITIVE Sensitive   ?  GENTAMICIN <=1 SENSITIVE Sensitive   ?  IMIPENEM 2 SENSITIVE Sensitive   ?  PIP/TAZO 8 SENSITIVE Sensitive   ?  CEFEPIME 2 SENSITIVE Sensitive   ?  * 20,000 COLONIES/mL PSEUDOMONAS AERUGINOSA  ? ? ?'[x]'$  Treated with Keflex, organism resistant to prescribed antimicrobial. Needs additional follow-up ? ?D/c Keflex and call for symptom check: ?- If improved/resolving - no further antibiotics needed. Can follow-up with PCP if symptoms return ?- If not improved - start Cipro 250 mg po bid x 7 days ? ?ED Provider: Benedetto Goad, PA-C ? ? ?Lawson Radar ?08/31/2021, 9:14 AM ?Clinical Pharmacist ?Monday - Friday phone -  [phone] ?Saturday - Sunday phone - [phone]  ?

## 2021-09-09 DIAGNOSIS — R413 Other amnesia: Secondary | ICD-10-CM | POA: Diagnosis not present

## 2021-09-09 DIAGNOSIS — Z79899 Other long term (current) drug therapy: Secondary | ICD-10-CM | POA: Diagnosis not present

## 2021-09-09 DIAGNOSIS — G43909 Migraine, unspecified, not intractable, without status migrainosus: Secondary | ICD-10-CM | POA: Diagnosis not present

## 2021-09-13 DIAGNOSIS — E1121 Type 2 diabetes mellitus with diabetic nephropathy: Secondary | ICD-10-CM | POA: Diagnosis not present

## 2021-09-16 DIAGNOSIS — R310 Gross hematuria: Secondary | ICD-10-CM | POA: Diagnosis not present

## 2021-09-16 DIAGNOSIS — N281 Cyst of kidney, acquired: Secondary | ICD-10-CM | POA: Diagnosis not present

## 2021-09-16 DIAGNOSIS — R195 Other fecal abnormalities: Secondary | ICD-10-CM | POA: Diagnosis not present

## 2021-09-16 DIAGNOSIS — N200 Calculus of kidney: Secondary | ICD-10-CM | POA: Diagnosis not present

## 2021-09-21 DIAGNOSIS — I25810 Atherosclerosis of coronary artery bypass graft(s) without angina pectoris: Secondary | ICD-10-CM | POA: Diagnosis not present

## 2021-09-21 DIAGNOSIS — J90 Pleural effusion, not elsewhere classified: Secondary | ICD-10-CM | POA: Diagnosis not present

## 2021-10-14 ENCOUNTER — Institutional Professional Consult (permissible substitution): Payer: Medicare HMO | Admitting: Pulmonary Disease

## 2021-10-21 ENCOUNTER — Encounter: Payer: Self-pay | Admitting: Internal Medicine

## 2021-10-21 ENCOUNTER — Ambulatory Visit: Payer: Medicare HMO | Admitting: Internal Medicine

## 2021-10-21 ENCOUNTER — Ambulatory Visit: Payer: Medicare HMO | Admitting: Cardiology

## 2021-10-21 DIAGNOSIS — J90 Pleural effusion, not elsewhere classified: Secondary | ICD-10-CM | POA: Diagnosis not present

## 2021-10-21 NOTE — Patient Instructions (Signed)
We will set up a R sided  thoracentesis at [facility] and call you with results when available ?

## 2021-10-21 NOTE — Progress Notes (Signed)
? ?Ashby Moskal, male    DOB: [birth year],   MRN: [id number] ? ? ?Brief patient profile:  ?70 yobm never smoker s/p cva ? Around 2003/lacunar with residual R hemiparesis Leg worse than hand referred to pulmonary clinic 10/21/2021 by Dr Ashby Dawes via Thedora Hinders NP  for new bilateral effusions.   ? ? ? ?History of Present Illness  ?10/21/2021  Pulmonary/ 1st office eval/Yvonnia Tango  ?Chief Complaint  ?Patient presents with  ? Pulmonary Consult  ?  Referred by Thedora Hinders, FNP for eval of pleural effusion. He had been coughing back in March 2023 and this was reason for cxr- cough has since resolved- tx for augmentin and cipro.   ?Dyspnea:  denies though w/c bound/ very sedentary,  ?Cough: better now ?Sleep: one pillow flat bed s orthopnea/pnd ?SABA use:  not using  ?Leg swelling R leg x years - new bilateral x 6 m  ? ?No obvious day to day or daytime variability or assoc excess/ purulent sputum or mucus plugs or hemoptysis or cp or chest tightness, subjective wheeze or overt sinus or hb symptoms.  ? ?Sleeping  without nocturnal  or early am exacerbation  of respiratory  c/o's or need for noct saba. Also denies any obvious fluctuation of symptoms with weather or environmental changes or other aggravating or alleviating factors except as outlined above  ? ?No unusual exposure hx or h/o childhood pna/ asthma or knowledge of premature birth. ? ?Current Allergies, Complete Past Medical History, Past Surgical History, Family History, and Social History were reviewed in [employer] record. ? ?ROS  The following are not active complaints unless bolded ?Hoarseness, sore throat, dysphagia, dental problems, itching, sneezing,  nasal congestion or discharge of excess mucus or purulent secretions, ear ache,   fever, chills, sweats, unintended wt loss or wt gain, classically pleuritic or exertional cp,  orthopnea pnd or arm/hand swelling  or leg swelling, presyncope, palpitations, abdominal pain, anorexia, nausea,  vomiting, diarrhea  or change in bowel habits or change in bladder habits, change in stools or change in urine, dysuria, hematuria,  rash, arthralgias, visual complaints, headache, numbness, weakness or ataxia or problems with walking or coordination,  change in mood or  memory. ?      ?   ? ?Past Medical History:  ?Diagnosis Date  ? Anemia 05/13/2013  ? Arthritis   ? "right leg" (07/25/2013)  ? BPH (benign prostatic hyperplasia)   ? Constipation 05/13/2013  ? Exertional shortness of breath   ? "sometimes" (07/25/2013)  ? History of kidney stones   ? History of stomach ulcers   ? Hypertension   ? Neuropathy   ? Polio   ? Polio Childhood  ? Small bowel obstruction ([place])   ? Stroke [facility]) 2014  ? residual:  "left hand kind of numb" (07/25/2013)  ? Type II diabetes mellitus ([place])   ? ? ?Outpatient Medications Prior to Visit  ?Medication Sig Dispense Refill  ? acetaminophen (TYLENOL) 325 MG tablet Take 2 tablets (650 mg total) by mouth every 6 (six) hours as needed for mild pain (or Fever >/= 101).    ? amLODipine (NORVASC) 5 MG tablet Take 5 mg by mouth daily.    ? aspirin EC 81 MG tablet Take 1 tablet (81 mg total) by mouth daily. 30 tablet 11  ? atorvastatin (LIPITOR) 20 MG tablet Take 20 mg by mouth every evening.     ? B Complex Vitamins (B COMPLEX PO) Take 1 tablet by mouth at bedtime.    ?  cholecalciferol (VITAMIN D3) 10 MCG (400 UNIT) TABS tablet Take 400 Units by mouth daily.    ? dorzolamide-timolol (COSOPT) 22.3-6.8 MG/ML ophthalmic solution Place 1 drop into both eyes 2 (two) times daily.    ? feeding supplement, GLUCERNA SHAKE, (GLUCERNA SHAKE) LIQD Take 237 mLs by mouth 3 (three) times daily between meals. (Patient taking differently: Take 237 mLs by mouth 2 (two) times daily between meals.)  0  ? gabapentin (NEURONTIN) 600 MG tablet Take 600 mg by mouth 2 (two) times daily as needed for headache.    ? latanoprost (XALATAN) 0.005 % ophthalmic solution Place 1 drop into both eyes at bedtime.     ? levothyroxine  (SYNTHROID) 88 MCG tablet Take 88 mcg by mouth at bedtime.     ? memantine (NAMENDA) 10 MG tablet Take 10 mg by mouth 2 (two) times daily.    ? metoprolol tartrate (LOPRESSOR) 25 MG tablet Take 1 tablet (25 mg total) by mouth 2 (two) times daily. 60 tablet 0  ? Multiple Vitamin (MULTIVITAMIN WITH MINERALS) TABS tablet Take 1 tablet by mouth daily.    ? olmesartan (BENICAR) 20 MG tablet Take 20 mg by mouth daily.    ? ondansetron (ZOFRAN-ODT) 8 MG disintegrating tablet Take 1 tablet (8 mg total) by mouth every 8 (eight) hours as needed for nausea. 20 tablet 0  ? pantoprazole (PROTONIX) 40 MG tablet Take 1 tablet (40 mg total) by mouth 2 (two) times daily before a meal. 120 tablet 0  ? pioglitazone (ACTOS) 30 MG tablet Take 30 mg by mouth daily.    ? polyethylene glycol (MIRALAX / GLYCOLAX) packet Take 17 g by mouth daily. (Patient taking differently: Take 17 g by mouth daily as needed for mild constipation.) 14 each 0  ? RHOPRESSA 0.02 % SOLN Place 1 drop into both eyes at bedtime.    ? Tamsulosin HCl (FLOMAX) 0.4 MG CAPS Take 0.4 mg by mouth in the morning and at bedtime.    ? brimonidine (ALPHAGAN) 0.2 % ophthalmic solution Place 1 drop into both eyes 2 (two) times daily.    ? cephALEXin (KEFLEX) 500 MG capsule Take 1 capsule (500 mg total) by mouth 3 (three) times daily. 21 capsule 0  ? ?No facility-administered medications prior to visit.  ? ? ? ?Objective:  ?  ? ?BP 132/85 (BP Location: Left Arm, Cuff Size: Normal)   Pulse 60   Temp 97.7 ?F (36.5 ?C) (Oral)   SpO2 97% Comment: on RA ? ?SpO2: 97 % (on RA) ? ? ? HEENT : nl exam   ? ? ?NECK :  without JVD/Nodes/TM/ nl carotid upstrokes bilaterally ? ? ?LUNGS: no acc muscle use,  Nl contour chest with decrease bs R> L with dullness  ? ? ?CV:  RRR  no s3 or murmur or increase in P2, and 2+ R , 1+ R pitting LE edema  ? ?ABD:  soft and nontender with nl inspiratory excursion in the supine position. No bruits or organomegaly appreciated, bowel sounds nl ? ?MS:  Nl  gait/ ext warm without deformities, calf tenderness, cyanosis or clubbing ?No obvious joint restrictions  ? ?SKIN: warm and dry without lesions   ? ?NEURO:  alert, approp, nl sensorium with  no motor or cerebellar deficits apparent.  ? ? ? ?I personally reviewed images and agree with radiology impression as follows:  ? Chest CT cuts on CT ABD  09/16/21  ?Bilateral R > L pleural effusions  ? ? ?Lab Results  ?  Component Value Date  ? CREATININE 1.80 (H) 08/28/2021  ? CREATININE 1.23 02/23/2021  ? CREATININE 1.34 (H) 02/22/2021  ?  ?   ?Assessment  ? ?Pleural effusion, bilateral ?Onset of cough Jan 2023  ?- See CT abd  09/16/21  ?- u/s t centesis 10/21/2021 >>>  ? ?ddx related to Bilateral effusions R > L is limited to chf/ vol overload for any reaons, low alb/ high TSH or intrabd fluid retention  ? ?He may be a bit better since diuresis though CRI limits how aggressive to be here and we don't really know the etiology of the fluid but presume component of vol overoad, diastolic dysfunction / CRI ? exac by use ACTOS ? ? ?rec ?Start with Tap R side if feasible under u/s for full analysis before considering next stop esp in this patient with min symptoms  With risk of ARI if get too aggressive. ? ?Discussed in detail all the  indications, usual  risks and alternatives  relative to the benefits with patient who agrees to proceed with w/u as outlined.    ? ?>>> f/u studies p T centesis, consider alternative to ACTOS/ also consider echo if not done  ? ? ?    ?  ? ?Each maintenance medication was reviewed in detail including emphasizing most importantly the difference between maintenance and prns and under what circumstances the prns are to be triggered using an action plan format where appropriate. ? ?Total time for H and P, chart review, counseling,  and generating customized AVS unique to this office visit / same day charting = 34 min new pt eval  ?     ? ? ? ? ? ?Christinia Gully, MD ?10/21/2021 ?  ?

## 2021-10-22 ENCOUNTER — Encounter: Payer: Self-pay | Admitting: Internal Medicine

## 2021-10-22 NOTE — Assessment & Plan Note (Addendum)
Onset of cough Jan 2023  ?- See CT abd  09/16/21  ?- u/s t centesis 10/21/2021 >>>  ? ?ddx related to Bilateral effusions R > L is limited to chf/ vol overload for any reaons, low alb/ high TSH or intrabd fluid retention  ? ?He may be a bit better since diuresis though CRI limits how aggressive to be here and we don't really know the etiology of the fluid but presume component of vol overoad, diastolic dysfunction / CRI ? exac by use ACTOS ? ? ?rec ?Start with Tap R side if feasible under u/s for full analysis before considering next stop esp in this patient with min symptoms  With risk of ARI if get too aggressive. ? ?Discussed in detail all the  indications, usual  risks and alternatives  relative to the benefits with patient who agrees to proceed with w/u as outlined.    ? ?>>> f/u studies p T centesis, consider alternative to ACTOS/ also consider echo if not done  ? ? ?    ?  ? ?Each maintenance medication was reviewed in detail including emphasizing most importantly the difference between maintenance and prns and under what circumstances the prns are to be triggered using an action plan format where appropriate. ? ?Total time for H and P, chart review, counseling,  and generating customized AVS unique to this office visit / same day charting = 34 min new pt eval  ?     ? ? ?

## 2021-10-25 ENCOUNTER — Ambulatory Visit (HOSPITAL_COMMUNITY)
Admission: RE | Admit: 2021-10-25 | Discharge: 2021-10-25 | Disposition: A | Discharge status: Home / Self Care | Payer: Medicare HMO | Source: Ambulatory Visit | Attending: Internal Medicine | Admitting: Internal Medicine

## 2021-10-25 ENCOUNTER — Other Ambulatory Visit: Payer: Self-pay | Admitting: Internal Medicine

## 2021-10-25 DIAGNOSIS — J90 Pleural effusion, not elsewhere classified: Secondary | ICD-10-CM

## 2021-10-25 DIAGNOSIS — R0600 Dyspnea, unspecified: Secondary | ICD-10-CM | POA: Diagnosis not present

## 2021-10-25 MED ORDER — LIDOCAINE HCL 1 % IJ SOLN
INTRAMUSCULAR | Status: AC
  Filled 2021-10-25: qty 20

## 2021-10-25 NOTE — Progress Notes (Signed)
Patient presented for thoracentesis secondary to effusions seen 3/23 on outside imaging.  Symptoms from that time have resolved.  Imaging suggests minimal pleural effusion on R and none on L.  There was not a safe pocket identified to access the fluid for thoracentesis, thus no procedure performed. ? ?Ordering provider Dr. Melvyn Novas informed at time of [place] scan. ? ?Electronically Signed: ?Pasty Spillers, PA-C ?10/25/2021, 2:59 PM ? ? ?

## 2021-10-27 ENCOUNTER — Ambulatory Visit: Payer: Medicare HMO | Admitting: Cardiology

## 2021-10-27 ENCOUNTER — Encounter: Payer: Self-pay | Admitting: Cardiology

## 2021-10-27 VITALS — BP 162/86 | HR 66 | Temp 97.8°F | Resp 14 | Ht 65.0 in

## 2021-10-27 DIAGNOSIS — N1831 Chronic kidney disease, stage 3a: Secondary | ICD-10-CM

## 2021-10-27 DIAGNOSIS — I2510 Atherosclerotic heart disease of native coronary artery without angina pectoris: Secondary | ICD-10-CM | POA: Diagnosis not present

## 2021-10-27 DIAGNOSIS — E7800 Pure hypercholesterolemia, unspecified: Secondary | ICD-10-CM

## 2021-10-27 DIAGNOSIS — I10 Essential (primary) hypertension: Secondary | ICD-10-CM

## 2021-10-27 DIAGNOSIS — Z8673 Personal history of transient ischemic attack (TIA), and cerebral infarction without residual deficits: Secondary | ICD-10-CM

## 2021-10-27 DIAGNOSIS — E1122 Type 2 diabetes mellitus with diabetic chronic kidney disease: Secondary | ICD-10-CM | POA: Diagnosis not present

## 2021-10-27 DIAGNOSIS — Z794 Long term (current) use of insulin: Secondary | ICD-10-CM | POA: Diagnosis not present

## 2021-10-27 NOTE — Progress Notes (Signed)
? ?Primary Physician/Referring:  [name], MD ? ?Patient ID: Mike Mack, male    DOB: [birth year], 86 y.o.   MRN: [id number] ? ?Chief Complaint  ?Patient presents with  ? Coronary Artery Disease  ? New Patient (Initial Visit)  ?  Referred by Thedora Hinders, FNP  ? ?HPI:   ? ?Mike Mack  is a 86 y.o. A[facility] patient with hypertension, hyperlipidemia, diabetes mellitus with stage IIIa chronic kidney disease, history of polio with right-leg weakness, history of old CVA in 2016, MRI of the brain in 2012 revealing multiple remote bilateral cerebellar infarct in addition to remote lacunar infarct involving left thalamus, referred to me for evaluation of coronary artery disease.  I had last seen him in 2018.  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  Accompanied by his wife.  He wanted to be checked out for heart disease. ? ?He has no chest pain, dyspnea.  He has chronic leg edema involving his right leg.  No PND or orthopnea. ? ?Past Medical History:  ?Diagnosis Date  ? Anemia 05/13/2013  ? Arthritis   ? "right leg" (07/25/2013)  ? BPH (benign prostatic hyperplasia)   ? Constipation 05/13/2013  ? Exertional shortness of breath   ? "sometimes" (07/25/2013)  ? History of kidney stones   ? History of stomach ulcers   ? Hypertension   ? Neuropathy   ? Polio   ? Polio Childhood  ? Small bowel obstruction ([place])   ? Stroke [facility]) 2014  ? residual:  "left hand kind of numb" (07/25/2013)  ? Type II diabetes mellitus ([place])   ? ?Past Surgical History:  ?Procedure Laterality Date  ? BIOPSY  11/30/2020  ? Procedure: BIOPSY;  Surgeon: Ronnette Juniper, MD;  Location: [place];  Service: Gastroenterology;;  ? BOWEL RESECTION    ? CATARACT EXTRACTION W/ INTRAOCULAR LENS  IMPLANT, BILATERAL Bilateral 2000's  ? CHOLECYSTECTOMY N/A 07/25/2013  ? Procedure: LAPAROSCOPIC CHOLECYSTECTOMY WITH INTRAOPERATIVE CHOLANGIOGRAM;  Surgeon: [name], MD;   Location: [place];  Service: General;  Laterality: N/A;  ? COLON SURGERY    ? COLONOSCOPY    ? Hx: of  ? CYSTOSCOPY WITH BIOPSY N/A 12/01/2019  ? Procedure: CYSTOSCOPY WITH BLADDER BIOPSY AND FULGURATION;  Surgeon: Lucas Mallow, MD;  Location: [facility];  Service: Urology;  Laterality: N/A;  ? ESOPHAGOGASTRODUODENOSCOPY N/A 11/30/2020  ? Procedure: ESOPHAGOGASTRODUODENOSCOPY (EGD);  Surgeon: Ronnette Juniper, MD;  Location: [place];  Service: Gastroenterology;  Laterality: N/A;  ? EXCISIONAL HEMORRHOIDECTOMY  2000's  ? EXTRACORPOREAL SHOCK WAVE LITHOTRIPSY Right 10/03/2018  ? Procedure: EXTRACORPOREAL SHOCK WAVE LITHOTRIPSY (ESWL);  Surgeon: Festus Aloe, MD;  Location: [facility];  Service: Urology;  Laterality: Right;  ? EYE SURGERY    ? INGUINAL HERNIA REPAIR Right 1970's  ? IR RADIOLOGIST EVAL & MGMT  04/06/2020  ? LAPAROSCOPIC CHOLECYSTECTOMY  07/25/2013  ? w/LOA (07/25/2013)  ? ?Family History  ?Problem Relation Age of Onset  ? Heart failure Mother   ? Hypertension Sister   ? Diabetes Sister   ? Diabetes Brother   ? Hypertension Brother   ?  ?Social History  ? ?Tobacco Use  ? Smoking status: Never  ? Smokeless tobacco: Never  ?Substance Use Topics  ? Alcohol use: No  ? ?Marital Status: Married  ?ROS  ?Review of Systems  ?Cardiovascular:  Positive for leg swelling (chronic right leg edema). Negative for chest pain and dyspnea  on exertion.  ?Neurological:  Positive for focal weakness (right leg).  ?Objective  ?Blood pressure (!) 162/86, pulse 66, temperature 97.8 ?F (36.6 ?C), temperature source Temporal, resp. rate 14, height '5\' 5"'$  (1.651 m), SpO2 97 %. Body mass index is 27.51 kg/m?.  ? ?  10/27/2021  ?  1:29 PM 10/25/2021  ?  2:35 PM 10/21/2021  ? 11:49 AM  ?Vitals with BMI  ?Height '5\' 5"'$     ?Systolic 845 364 680  ?Diastolic 86 69 85  ?Pulse 66  60  ?  ? Physical Exam ?Neck:  ?   Vascular: No JVD.  ?Cardiovascular:  ?   Rate and Rhythm: Normal rate and regular rhythm.  ?   Pulses: Intact distal pulses.  ?   Heart  sounds: Normal heart sounds. No murmur heard. ?  No gallop.  ?Pulmonary:  ?   Effort: Pulmonary effort is normal.  ?   Breath sounds: Normal breath sounds.  ?Abdominal:  ?   General: Bowel sounds are normal.  ?   Palpations: Abdomen is soft.  ?Musculoskeletal:  ?   Right lower leg: Edema (Ankle and foot 2+ edema) present.  ?   Left lower leg: No edema.  ? ? ?Medications and allergies  ? ?Allergies  ?Allergen Reactions  ? Metformin And Related Other (See Comments)  ?  Gi intolerance  ? Other   ?  Other reaction(s): Other (See Comments) ?Renal insufficiency ?Other reaction(s): GI Upset (intolerance) ?Gi intolerance  ? [name] [Insulin Glargine-Lixisenatide] Other (See Comments)  ?  Stomach cramps  ?  ? ?Medication list after today's encounter  ? ?Current Outpatient Medications:  ?  acetaminophen (TYLENOL) 325 MG tablet, Take 2 tablets (650 mg total) by mouth every 6 (six) hours as needed for mild pain (or Fever >/= 101)., Disp: , Rfl:  ?  amLODipine (NORVASC) 5 MG tablet, Take 5 mg by mouth daily., Disp: , Rfl:  ?  aspirin EC 81 MG tablet, Take 1 tablet (81 mg total) by mouth daily., Disp: 30 tablet, Rfl: 11 ?  atorvastatin (LIPITOR) 20 MG tablet, Take 20 mg by mouth every evening. , Disp: , Rfl:  ?  B Complex Vitamins (B COMPLEX PO), Take 1 tablet by mouth at bedtime., Disp: , Rfl:  ?  brimonidine (ALPHAGAN) 0.2 % ophthalmic solution, Place 1 drop into both eyes in the morning and at bedtime., Disp: , Rfl:  ?  cholecalciferol (VITAMIN D3) 10 MCG (400 UNIT) TABS tablet, Take 400 Units by mouth daily., Disp: , Rfl:  ?  dorzolamide-timolol (COSOPT) 22.3-6.8 MG/ML ophthalmic solution, Place 1 drop into both eyes 2 (two) times daily., Disp: , Rfl:  ?  feeding supplement, GLUCERNA SHAKE, (GLUCERNA SHAKE) LIQD, Take 237 mLs by mouth 3 (three) times daily between meals. (Patient taking differently: Take 237 mLs by mouth 2 (two) times daily between meals.), Disp: , Rfl: 0 ?  gabapentin (NEURONTIN) 600 MG tablet, Take 600 mg  by mouth 2 (two) times daily as needed for headache., Disp: , Rfl:  ?  insulin degludec (TRESIBA FLEXTOUCH) 100 UNIT/ML FlexTouch Pen, Inject 15 Units into the skin daily., Disp: , Rfl:  ?  latanoprost (XALATAN) 0.005 % ophthalmic solution, Place 1 drop into both eyes at bedtime. , Disp: , Rfl:  ?  levothyroxine (SYNTHROID) 88 MCG tablet, Take 88 mcg by mouth at bedtime. , Disp: , Rfl:  ?  memantine (NAMENDA) 10 MG tablet, Take 10 mg by mouth 2 (two) times daily., Disp: , Rfl:  ?  metoprolol tartrate (LOPRESSOR) 25 MG tablet, Take 1 tablet (25 mg total) by mouth 2 (two) times daily., Disp: 60 tablet, Rfl: 0 ?  Multiple Vitamin (MULTIVITAMIN WITH MINERALS) TABS tablet, Take 1 tablet by mouth daily., Disp: , Rfl:  ?  olmesartan (BENICAR) 20 MG tablet, Take 20 mg by mouth daily., Disp: , Rfl:  ?  ondansetron (ZOFRAN-ODT) 8 MG disintegrating tablet, Take 1 tablet (8 mg total) by mouth every 8 (eight) hours as needed for nausea., Disp: 20 tablet, Rfl: 0 ?  pantoprazole (PROTONIX) 40 MG tablet, Take 1 tablet (40 mg total) by mouth 2 (two) times daily before a meal., Disp: 120 tablet, Rfl: 0 ?  pioglitazone (ACTOS) 30 MG tablet, Take 30 mg by mouth daily., Disp: , Rfl:  ?  polyethylene glycol (MIRALAX / GLYCOLAX) packet, Take 17 g by mouth daily. (Patient taking differently: Take 17 g by mouth daily as needed for mild constipation.), Disp: 14 each, Rfl: 0 ?  RHOPRESSA 0.02 % SOLN, Place 1 drop into both eyes at bedtime., Disp: , Rfl:  ?  Tamsulosin HCl (FLOMAX) 0.4 MG CAPS, Take 0.4 mg by mouth in the morning and at bedtime., Disp: , Rfl:  ? ?Laboratory examination:  ? ?Recent Labs  ?  02/22/21 ?0116 02/23/21 ?0045 08/28/21 ?1000  ?NA 136 135 136  ?K 4.4 4.0 4.2  ?CL 106 106 103  ?CO2 '25 23 28  '$ ?GLUCOSE 275* 128* 170*  ?BUN '19 16 23  '$ ?CREATININE 1.34* 1.23 1.80*  ?CALCIUM 9.3 8.6* 8.6*  ?GFRNONAA 53* 59* 37*  ? ?CrCl cannot be calculated (Patient's most recent lab result is older than the maximum 21 days allowed.).  ? ?   Latest Ref Rng & Units 08/28/2021  ? 10:00 AM 02/23/2021  ? 12:45 AM 02/22/2021  ?  1:16 AM  ?CMP  ?Glucose 70 - 99 mg/dL 170   128   275    ?BUN 8 - 23 mg/dL '23   16   19    '$ ?Creatinine 0.61 - 1.24 mg/dL 1.80   1.23

## 2021-10-28 DIAGNOSIS — N3000 Acute cystitis without hematuria: Secondary | ICD-10-CM | POA: Diagnosis not present

## 2021-10-28 DIAGNOSIS — N481 Balanitis: Secondary | ICD-10-CM | POA: Diagnosis not present

## 2021-10-28 DIAGNOSIS — R300 Dysuria: Secondary | ICD-10-CM | POA: Diagnosis not present

## 2021-10-28 DIAGNOSIS — R310 Gross hematuria: Secondary | ICD-10-CM | POA: Diagnosis not present

## 2021-10-28 NOTE — Progress Notes (Signed)
Called pt and there was no answer-line rings and then turns to busy signal.

## 2021-11-08 ENCOUNTER — Telehealth: Payer: Self-pay | Admitting: Internal Medicine

## 2021-11-08 ENCOUNTER — Encounter: Payer: Self-pay | Admitting: *Deleted

## 2021-11-08 DIAGNOSIS — J90 Pleural effusion, not elsewhere classified: Secondary | ICD-10-CM

## 2021-11-08 NOTE — Progress Notes (Signed)
Last msg typed in error. I called pt again on both numbers listed and there was no answer- will mail letter.

## 2021-11-08 NOTE — Progress Notes (Signed)
Spoke with pt and notified of results per Dr. Melvyn Novas. Pt verbalized understanding and denied any questions.  He was agreeable to CTA and this has been ordered.

## 2021-11-08 NOTE — Telephone Encounter (Signed)
Called and spoke with patient's wife [name] to go over [place] Chest results and to get patient scheduled for follow up visit with CXR. Patient has been scheduled and CXR ordered. Advised her if patient's symptoms got worse to call and let us know. She expressed understanding. Nothing further needed at this time.  Next Appt With Pulmonology Christinia Gully, MD) 12/12/2021 at 10:30 AM

## 2021-11-16 DIAGNOSIS — I10 Essential (primary) hypertension: Secondary | ICD-10-CM | POA: Diagnosis not present

## 2021-11-16 DIAGNOSIS — I69998 Other sequelae following unspecified cerebrovascular disease: Secondary | ICD-10-CM | POA: Diagnosis not present

## 2021-11-16 DIAGNOSIS — E782 Mixed hyperlipidemia: Secondary | ICD-10-CM | POA: Diagnosis not present

## 2021-11-16 DIAGNOSIS — Z794 Long term (current) use of insulin: Secondary | ICD-10-CM | POA: Diagnosis not present

## 2021-11-16 DIAGNOSIS — I739 Peripheral vascular disease, unspecified: Secondary | ICD-10-CM | POA: Diagnosis not present

## 2021-11-16 DIAGNOSIS — E039 Hypothyroidism, unspecified: Secondary | ICD-10-CM | POA: Diagnosis not present

## 2021-11-16 DIAGNOSIS — E11319 Type 2 diabetes mellitus with unspecified diabetic retinopathy without macular edema: Secondary | ICD-10-CM | POA: Diagnosis not present

## 2021-11-16 DIAGNOSIS — G301 Alzheimer's disease with late onset: Secondary | ICD-10-CM | POA: Diagnosis not present

## 2021-11-16 DIAGNOSIS — N1831 Chronic kidney disease, stage 3a: Secondary | ICD-10-CM | POA: Diagnosis not present

## 2021-11-23 DIAGNOSIS — Z794 Long term (current) use of insulin: Secondary | ICD-10-CM | POA: Diagnosis not present

## 2021-11-23 DIAGNOSIS — I10 Essential (primary) hypertension: Secondary | ICD-10-CM | POA: Diagnosis not present

## 2021-11-23 DIAGNOSIS — Z0000 Encounter for general adult medical examination without abnormal findings: Secondary | ICD-10-CM | POA: Diagnosis not present

## 2021-11-23 DIAGNOSIS — E11319 Type 2 diabetes mellitus with unspecified diabetic retinopathy without macular edema: Secondary | ICD-10-CM | POA: Diagnosis not present

## 2021-11-23 DIAGNOSIS — B91 Sequelae of poliomyelitis: Secondary | ICD-10-CM | POA: Diagnosis not present

## 2021-11-23 DIAGNOSIS — G301 Alzheimer's disease with late onset: Secondary | ICD-10-CM | POA: Diagnosis not present

## 2021-11-23 DIAGNOSIS — E782 Mixed hyperlipidemia: Secondary | ICD-10-CM | POA: Diagnosis not present

## 2021-11-23 DIAGNOSIS — I69341 Monoplegia of lower limb following cerebral infarction affecting right dominant side: Secondary | ICD-10-CM | POA: Diagnosis not present

## 2021-11-23 DIAGNOSIS — I739 Peripheral vascular disease, unspecified: Secondary | ICD-10-CM | POA: Diagnosis not present

## 2021-11-25 DIAGNOSIS — H35033 Hypertensive retinopathy, bilateral: Secondary | ICD-10-CM | POA: Diagnosis not present

## 2021-11-25 DIAGNOSIS — H401133 Primary open-angle glaucoma, bilateral, severe stage: Secondary | ICD-10-CM | POA: Diagnosis not present

## 2021-11-25 DIAGNOSIS — H04123 Dry eye syndrome of bilateral lacrimal glands: Secondary | ICD-10-CM | POA: Diagnosis not present

## 2021-11-25 DIAGNOSIS — H524 Presbyopia: Secondary | ICD-10-CM | POA: Diagnosis not present

## 2021-11-25 DIAGNOSIS — E113593 Type 2 diabetes mellitus with proliferative diabetic retinopathy without macular edema, bilateral: Secondary | ICD-10-CM | POA: Diagnosis not present

## 2021-11-29 DIAGNOSIS — E118 Type 2 diabetes mellitus with unspecified complications: Secondary | ICD-10-CM | POA: Diagnosis not present

## 2021-11-29 DIAGNOSIS — N1832 Chronic kidney disease, stage 3b: Secondary | ICD-10-CM | POA: Diagnosis not present

## 2021-12-02 DIAGNOSIS — H524 Presbyopia: Secondary | ICD-10-CM | POA: Diagnosis not present

## 2021-12-06 DIAGNOSIS — I739 Peripheral vascular disease, unspecified: Secondary | ICD-10-CM | POA: Diagnosis not present

## 2021-12-06 DIAGNOSIS — M6281 Muscle weakness (generalized): Secondary | ICD-10-CM | POA: Diagnosis not present

## 2021-12-06 DIAGNOSIS — L03032 Cellulitis of left toe: Secondary | ICD-10-CM | POA: Diagnosis not present

## 2021-12-06 DIAGNOSIS — R262 Difficulty in walking, not elsewhere classified: Secondary | ICD-10-CM | POA: Diagnosis not present

## 2021-12-12 ENCOUNTER — Ambulatory Visit: Payer: Medicare HMO | Admitting: Internal Medicine

## 2021-12-12 ENCOUNTER — Encounter: Payer: Self-pay | Admitting: Internal Medicine

## 2021-12-12 ENCOUNTER — Ambulatory Visit (INDEPENDENT_AMBULATORY_CARE_PROVIDER_SITE_OTHER): Payer: Medicare HMO

## 2021-12-12 DIAGNOSIS — J9811 Atelectasis: Secondary | ICD-10-CM | POA: Diagnosis not present

## 2021-12-12 DIAGNOSIS — J90 Pleural effusion, not elsewhere classified: Secondary | ICD-10-CM | POA: Diagnosis not present

## 2021-12-13 ENCOUNTER — Encounter: Payer: Self-pay | Admitting: Internal Medicine

## 2021-12-15 DIAGNOSIS — R262 Difficulty in walking, not elsewhere classified: Secondary | ICD-10-CM | POA: Diagnosis not present

## 2021-12-15 DIAGNOSIS — L03032 Cellulitis of left toe: Secondary | ICD-10-CM | POA: Diagnosis not present

## 2021-12-15 DIAGNOSIS — I739 Peripheral vascular disease, unspecified: Secondary | ICD-10-CM | POA: Diagnosis not present

## 2021-12-15 DIAGNOSIS — M6281 Muscle weakness (generalized): Secondary | ICD-10-CM | POA: Diagnosis not present

## 2021-12-16 DIAGNOSIS — H401133 Primary open-angle glaucoma, bilateral, severe stage: Secondary | ICD-10-CM | POA: Diagnosis not present

## 2021-12-28 DIAGNOSIS — N1832 Chronic kidney disease, stage 3b: Secondary | ICD-10-CM | POA: Diagnosis not present

## 2022-01-03 DIAGNOSIS — N1832 Chronic kidney disease, stage 3b: Secondary | ICD-10-CM | POA: Diagnosis not present

## 2022-01-03 DIAGNOSIS — E118 Type 2 diabetes mellitus with unspecified complications: Secondary | ICD-10-CM | POA: Diagnosis not present

## 2022-01-10 ENCOUNTER — Other Ambulatory Visit: Payer: Self-pay

## 2022-01-10 NOTE — Patient Outreach (Signed)
[facility]) Care Management  01/10/2022  Mike Mack July 17, 1935 [id number]   Telephone call to patient for nurse call.  No answer.  HIPAA compliant voice message left.    Plan: RN CM will attempt again within 4 business days and send letter.  Jone Baseman, RN, MSN [facility] Care Management Care Management Coordinator Direct Line [phone] Toll Free: [phone]  Fax: [phone]

## 2022-01-16 ENCOUNTER — Other Ambulatory Visit: Payer: Self-pay

## 2022-01-16 DIAGNOSIS — R238 Other skin changes: Secondary | ICD-10-CM | POA: Diagnosis not present

## 2022-01-16 DIAGNOSIS — R300 Dysuria: Secondary | ICD-10-CM | POA: Diagnosis not present

## 2022-01-16 DIAGNOSIS — K5909 Other constipation: Secondary | ICD-10-CM | POA: Diagnosis not present

## 2022-01-16 NOTE — Patient Outreach (Signed)
[facility]) Care Management  01/16/2022  Mike Mack 22-Mar-1936 [id number]     Telephone call to patient for nurse call.  No answer.  HIPAA compliant voice message left.     Plan: RN CM will attempt again within 4 business days.  Jone Baseman, RN, MSN [facility] Care Management Care Management Coordinator Direct Line [phone] Toll Free: [phone]  Fax: [phone]

## 2022-01-19 ENCOUNTER — Other Ambulatory Visit: Payer: Self-pay

## 2022-01-19 NOTE — Patient Outreach (Signed)
[place] [facility]) Care Management  01/19/2022  Mike Mack 10-17-1935 [id number]   Telephone call to patient for nurse call. Male answers stating patient not in.  Advised to have patient give CM a call back.  She verbalized understanding.    Plan: RN CM will wait return call. If no return call will attempt again within 3 weeks.    Jone Baseman, RN, MSN [facility] Care Management Care Management Coordinator Direct Line [phone] Toll Free: [phone]  Fax: [phone]

## 2022-01-24 DIAGNOSIS — H40053 Ocular hypertension, bilateral: Secondary | ICD-10-CM | POA: Diagnosis not present

## 2022-01-24 DIAGNOSIS — Z794 Long term (current) use of insulin: Secondary | ICD-10-CM | POA: Diagnosis not present

## 2022-01-24 DIAGNOSIS — H04123 Dry eye syndrome of bilateral lacrimal glands: Secondary | ICD-10-CM | POA: Diagnosis not present

## 2022-01-24 DIAGNOSIS — I10 Essential (primary) hypertension: Secondary | ICD-10-CM | POA: Diagnosis not present

## 2022-01-24 DIAGNOSIS — Z7984 Long term (current) use of oral hypoglycemic drugs: Secondary | ICD-10-CM | POA: Diagnosis not present

## 2022-01-24 DIAGNOSIS — E113553 Type 2 diabetes mellitus with stable proliferative diabetic retinopathy, bilateral: Secondary | ICD-10-CM | POA: Diagnosis not present

## 2022-01-31 ENCOUNTER — Other Ambulatory Visit: Payer: Self-pay

## 2022-01-31 DIAGNOSIS — N1832 Chronic kidney disease, stage 3b: Secondary | ICD-10-CM | POA: Diagnosis not present

## 2022-01-31 NOTE — Patient Outreach (Signed)
[facility]) Care Management  01/31/2022  Mike Mack [birth year] [id number]   Telephone call #4 to patient for nurse call.  No answer.  HIPAA compliant voice message left.    Plan: Will close case.    Jone Baseman, RN, MSN [facility] Care Management Care Management Coordinator Direct Line [phone] Toll Free: [phone]  Fax: [phone]

## 2022-02-02 ENCOUNTER — Other Ambulatory Visit: Payer: Self-pay

## 2022-02-02 ENCOUNTER — Emergency Department (HOSPITAL_COMMUNITY): Payer: Medicare HMO

## 2022-02-02 ENCOUNTER — Encounter (HOSPITAL_COMMUNITY): Payer: Self-pay | Admitting: *Deleted

## 2022-02-02 ENCOUNTER — Emergency Department (HOSPITAL_COMMUNITY)
Admission: EM | Admit: 2022-02-02 | Discharge: 2022-02-02 | Disposition: A | Discharge status: Home / Self Care | Payer: Medicare HMO | Attending: Emergency Medicine | Admitting: Emergency Medicine

## 2022-02-02 DIAGNOSIS — I10 Essential (primary) hypertension: Secondary | ICD-10-CM | POA: Diagnosis not present

## 2022-02-02 DIAGNOSIS — M25561 Pain in right knee: Secondary | ICD-10-CM | POA: Insufficient documentation

## 2022-02-02 DIAGNOSIS — W050XXA Fall from non-moving wheelchair, initial encounter: Secondary | ICD-10-CM | POA: Diagnosis not present

## 2022-02-02 DIAGNOSIS — S82141A Displaced bicondylar fracture of right tibia, initial encounter for closed fracture: Secondary | ICD-10-CM | POA: Insufficient documentation

## 2022-02-02 DIAGNOSIS — G8929 Other chronic pain: Secondary | ICD-10-CM | POA: Diagnosis not present

## 2022-02-02 DIAGNOSIS — Z7982 Long term (current) use of aspirin: Secondary | ICD-10-CM | POA: Diagnosis not present

## 2022-02-02 DIAGNOSIS — S8991XA Unspecified injury of right lower leg, initial encounter: Secondary | ICD-10-CM | POA: Diagnosis present

## 2022-02-02 DIAGNOSIS — M7989 Other specified soft tissue disorders: Secondary | ICD-10-CM | POA: Diagnosis not present

## 2022-02-02 DIAGNOSIS — Z7984 Long term (current) use of oral hypoglycemic drugs: Secondary | ICD-10-CM | POA: Insufficient documentation

## 2022-02-02 DIAGNOSIS — M25461 Effusion, right knee: Secondary | ICD-10-CM | POA: Diagnosis not present

## 2022-02-02 DIAGNOSIS — Z794 Long term (current) use of insulin: Secondary | ICD-10-CM | POA: Insufficient documentation

## 2022-02-02 DIAGNOSIS — Y92481 Parking lot as the place of occurrence of the external cause: Secondary | ICD-10-CM | POA: Diagnosis not present

## 2022-02-02 DIAGNOSIS — R1314 Dysphagia, pharyngoesophageal phase: Secondary | ICD-10-CM | POA: Diagnosis not present

## 2022-02-02 DIAGNOSIS — Z79899 Other long term (current) drug therapy: Secondary | ICD-10-CM | POA: Insufficient documentation

## 2022-02-02 DIAGNOSIS — E119 Type 2 diabetes mellitus without complications: Secondary | ICD-10-CM | POA: Insufficient documentation

## 2022-02-02 DIAGNOSIS — W19XXXA Unspecified fall, initial encounter: Secondary | ICD-10-CM

## 2022-02-02 DIAGNOSIS — M1611 Unilateral primary osteoarthritis, right hip: Secondary | ICD-10-CM | POA: Diagnosis not present

## 2022-02-02 MED ORDER — OXYCODONE-ACETAMINOPHEN 5-325 MG PO TABS
1.0000 | ORAL_TABLET | Freq: Once | ORAL | Status: AC
  Administered 2022-02-02: 1 via ORAL
  Filled 2022-02-02: qty 1

## 2022-02-02 MED ORDER — ONDANSETRON 4 MG PO TBDP
4.0000 mg | ORAL_TABLET | Freq: Once | ORAL | Status: AC
  Administered 2022-02-02: 4 mg via ORAL
  Filled 2022-02-02: qty 1

## 2022-02-02 MED ORDER — HYDROCODONE-ACETAMINOPHEN 5-325 MG PO TABS: 1.0000 | ORAL_TABLET | ORAL | 0 refills | Status: DC | PRN

## 2022-02-02 NOTE — ED Triage Notes (Signed)
BIB [facility] from [place] center, was seeing PCP for routine visit, fell in parking lot when transferring from car to w/c, landed on R knee. C/o R knee pain. Rates 3/10 on arrival. Reported that pain was too great to get xrays and PCP office. Reports no recent falls. Denies dizziness. Did not hit head, no LOC. Denies neck or back pain. BS 248. VSS. Reports no swelling or bruising.

## 2022-02-02 NOTE — ED Provider Notes (Signed)
[name] EMERGENCY DEPARTMENT Provider Note   CSN: [id number] Arrival date & time: 02/02/22  1404     History  Chief Complaint  Patient presents with   Mike Mack is a 86 y.o. male.  86 year old male with history of polio affecting his right leg brought in by EMS with wife at bedside after fall today.  Patient was at the [place] office transferring from car to wheelchair when he fell injuring his right knee.  Denies hitting his head or loss of consciousness.  Denies upper extremity injury, any other injuries, complaints or concerns.  Patient generally uses a wheelchair, is able to stand to pivot for transfers at baseline.  Last oral intake was breakfast this morning.       Home Medications Prior to Admission medications   Medication Sig Start Date End Date Taking? Authorizing Provider  HYDROcodone-acetaminophen (NORCO/VICODIN) 5-325 MG tablet Take 1 tablet by mouth every 4 (four) hours as needed. 02/02/22  Yes Tacy Learn, PA-C  acetaminophen (TYLENOL) 325 MG tablet Take 2 tablets (650 mg total) by mouth every 6 (six) hours as needed for mild pain (or Fever >/= 101). 11/27/20   Debbe Odea, MD  amLODipine (NORVASC) 5 MG tablet Take 5 mg by mouth daily. 12/17/20   [provider]  aspirin EC 81 MG tablet Take 1 tablet (81 mg total) by mouth daily. 12/16/20   Lavina Hamman, MD  atorvastatin (LIPITOR) 20 MG tablet Take 20 mg by mouth every evening.  11/03/13   [provider]  B Complex Vitamins (B COMPLEX PO) Take 1 tablet by mouth at bedtime.    [provider]  brimonidine (ALPHAGAN) 0.2 % ophthalmic solution Place 1 drop into both eyes in the morning and at bedtime.    [provider]  cholecalciferol (VITAMIN D3) 10 MCG (400 UNIT) TABS tablet Take 400 Units by mouth daily.    [provider]  dorzolamide-timolol (COSOPT) 22.3-6.8 MG/ML ophthalmic solution Place 1 drop into both eyes 2 (two) times  daily. 03/04/20   [provider]  feeding supplement, GLUCERNA SHAKE, (GLUCERNA SHAKE) LIQD Take 237 mLs by mouth 3 (three) times daily between meals. Patient taking differently: Take 237 mLs by mouth 2 (two) times daily between meals. 03/25/20   Antonieta Pert, MD  gabapentin (NEURONTIN) 600 MG tablet Take 600 mg by mouth 2 (two) times daily as needed for headache. 01/21/21   [provider]  insulin degludec (TRESIBA FLEXTOUCH) 100 UNIT/ML FlexTouch Pen Inject 15 Units into the skin daily. 09/08/20   [provider]  latanoprost (XALATAN) 0.005 % ophthalmic solution Place 1 drop into both eyes at bedtime.  11/17/14   [provider]  levothyroxine (SYNTHROID) 88 MCG tablet Take 88 mcg by mouth at bedtime.  04/21/11   [provider]  memantine (NAMENDA) 10 MG tablet Take 10 mg by mouth 2 (two) times daily. 11/04/20   [provider]  metoprolol tartrate (LOPRESSOR) 25 MG tablet Take 1 tablet (25 mg total) by mouth 2 (two) times daily. 12/09/20   Lavina Hamman, MD  Multiple Vitamin (MULTIVITAMIN WITH MINERALS) TABS tablet Take 1 tablet by mouth daily. 09/06/13   [name], MD  olmesartan (BENICAR) 20 MG tablet Take 20 mg by mouth daily. 02/13/21   [provider]  ondansetron (ZOFRAN-ODT) 8 MG disintegrating tablet Take 1 tablet (8 mg total) by mouth every 8 (eight) hours as needed for nausea. 08/28/21  Varney Biles, MD  pantoprazole (PROTONIX) 40 MG tablet Take 1 tablet (40 mg total) by mouth 2 (two) times daily before a meal. 12/09/20   Lavina Hamman, MD  pioglitazone (ACTOS) 30 MG tablet Take 30 mg by mouth daily. 12/16/20   [provider]  polyethylene glycol (MIRALAX / GLYCOLAX) packet Take 17 g by mouth daily. Patient taking differently: Take 17 g by mouth daily as needed for mild constipation. 09/06/13   [name], MD  RHOPRESSA 0.02 % SOLN Place 1 drop into both eyes at bedtime. 03/05/20   [provider]  Tamsulosin HCl (FLOMAX) 0.4 MG CAPS Take 0.4 mg by mouth in the morning and at bedtime.    [provider]      Allergies    Metformin and related, Other, and [place] [insulin glargine-lixisenatide]    Review of Systems   Review of Systems Negative except as per HPI Physical Exam Updated Vital Signs BP (!) 153/67   Pulse (!) 57   Temp 98 F (36.7 C)   Resp 16   Ht '5\' 5"'$  (1.651 m)   Wt 74.8 kg   SpO2 94%   BMI 27.46 kg/m  Physical Exam Vitals and nursing note reviewed.  Constitutional:      General: He is not in acute distress.    Appearance: He is well-developed. He is not diaphoretic.  HENT:     Head: Normocephalic and atraumatic.  Cardiovascular:     Pulses: Normal pulses.  Pulmonary:     Effort: Pulmonary effort is normal.  Abdominal:     Palpations: Abdomen is soft.     Tenderness: There is no abdominal tenderness.  Musculoskeletal:        General: Tenderness present. No swelling.     Comments: Right leg appears externally rotated, question shortening although hip and knee are somewhat flexed.  Sensation intact, able to move toes.  Has tenderness over the right knee, no pain with palpation of the right hip, difficult to range hip secondary to knee pain.  No left lower extremity pain.  No upper extremity injury.  Abdomen soft nontender.  Skin:    General: Skin is warm and dry.     Findings: No erythema or rash.  Neurological:     Mental Status: He is alert and oriented to person, place, and time.     Sensory: No sensory deficit.     Motor: No weakness.  Psychiatric:        Behavior: Behavior normal.     ED Results / Procedures / Treatments   Labs (all labs ordered are listed, but only abnormal results are displayed) Labs Reviewed - No data to display  EKG None  Radiology DG Knee Complete 4 Views Right  Result Date: 02/02/2022 CLINICAL DATA:  fall, pain EXAM: RIGHT KNEE - COMPLETE 4+ VIEW COMPARISON:  None Available. FINDINGS: Diffuse  osteopenia. There is downsloping of the tibial plateaus and focal angulation at the metaphyseal junction. Moderate size joint effusion. Articular surface irregularity of the lateral femoral condyle. IMPRESSION: Findings suspicious for lateral tibial plateau fracture. Moderate-sized joint effusion. Recommend CT. Electronically Signed   By: Maurine Simmering M.D.   On: 02/02/2022 15:28   DG Hip Unilat With Pelvis 2-3 Views Right  Result Date: 02/02/2022 CLINICAL DATA:  fall, pain EXAM: DG HIP (WITH OR WITHOUT PELVIS) 2-3V RIGHT COMPARISON:  CT 04/06/2020. FINDINGS: Diffuse osteopenia. There is no evidence of acute fracture. Alignment is normal. There is moderate osteoarthritis of  the right hip. Pelvic phleboliths. IMPRESSION: No evidence of acute right hip fracture. Moderate right hip osteoarthritis. Electronically Signed   By: Maurine Simmering M.D.   On: 02/02/2022 15:23    Procedures Procedures    Medications Ordered in ED Medications  oxyCODONE-acetaminophen (PERCOCET/ROXICET) 5-325 MG per tablet 1 tablet (1 tablet Oral Given 02/02/22 1436)  ondansetron (ZOFRAN-ODT) disintegrating tablet 4 mg (4 mg Oral Given 02/02/22 1436)    ED Course/ Medical Decision Making/ A&P                           Medical Decision Making Amount and/or Complexity of Data Reviewed Radiology: ordered.  Risk Prescription drug management.   This patient presents to the ED for concern of right knee pain after fall, this involves an extensive number of treatment options, and is a complaint that carries with it a high risk of complications and morbidity.  The differential diagnosis includes but not limited to strain, fracture, dislocation    Co morbidities that complicate the patient evaluation  Polio, HTN, DM, CVA   Additional history obtained:  Additional history obtained from wife at bedside who assist with history as above External records from outside source obtained and reviewed including visit to pulmonology for  pleural effusion with cough dated 12/12/21   Imaging Studies ordered:  I ordered imaging studies including Xr of the right hip and knee  I independently visualized and interpreted imaging which showed no hip fracture, right tibial plateau fracture I agree with the radiologist interpretation   Problem List / ED Course / Critical interventions / Medication management  86 year old male with history of polio affecting the right leg presents with wife with concern for pain in the right knee after a fall today as above.  He is found to have pain in the right knee which is held in slight flexion of the knee and hip.  X-ray of the right hip and knee were obtained.  There is no evidence of hip fracture.  He does have concern for a right tibial plateau fracture.  Plan is for CT of the right knee for follow-up planning, will call orthopedics after imaging complete. I ordered medication including Percocet for pain Reevaluation of the patient after these medicines showed that the patient improved I have reviewed the patients home medicines and have made adjustments as needed   Social Determinants of Health:   Wheelchair dependent, is able to stand to pivot.  Test / Admission - Considered:  Final disposition pending at time of signout to oncoming provider pending CT read and Ortho consult.  Plan is for likely discharge home, nonweightbearing with knee immobilizer on to follow-up.  Wife understands this plan, states that he is generally nonweightbearing on this leg at baseline.         Final Clinical Impression(s) / ED Diagnoses Final diagnoses:  Fall, initial encounter  Closed fracture of right tibial plateau, initial encounter    Rx / DC Orders ED Discharge Orders          Ordered    HYDROcodone-acetaminophen (NORCO/VICODIN) 5-325 MG tablet  Every 4 hours PRN        02/02/22 1919              Roque Lias 02/02/22 1934    Dorie Rank, MD 02/04/22 1254

## 2022-02-02 NOTE — Progress Notes (Signed)
Orthopedic Tech Progress Note Patient Details:  Mike Mack 03-07-1936 [id number]  Ortho Devices Type of Ortho Device: Knee Immobilizer Ortho Device/Splint Location: rle Ortho Device/Splint Interventions: Ordered, Application, Adjustment   Post Interventions Patient Tolerated: Well  Edwina Barth 02/02/2022, 7:30 PM

## 2022-02-02 NOTE — ED Notes (Signed)
Pt to xray

## 2022-02-02 NOTE — Discharge Instructions (Addendum)
Do NOT put weight on the right leg. Wear immobilizer.  Follow up with orthopedics. Norco as needed as prescribed for pain. This medication can cause constipation, take a stool softener as needed.

## 2022-02-02 NOTE — ED Notes (Signed)
ED PA at BS 

## 2022-02-02 NOTE — ED Notes (Signed)
Discharge instructions reviewed with patient. Patient verbalized understanding of instructions. Follow-up care and medications were reviewed. Patient wheeled out of ED and helped into POV.

## 2022-02-07 DIAGNOSIS — M79661 Pain in right lower leg: Secondary | ICD-10-CM | POA: Diagnosis not present

## 2022-02-12 ENCOUNTER — Emergency Department (HOSPITAL_COMMUNITY): Payer: Medicare HMO

## 2022-02-12 ENCOUNTER — Emergency Department (HOSPITAL_COMMUNITY)
Admission: EM | Admit: 2022-02-12 | Discharge: 2022-02-12 | Disposition: A | Discharge status: Home / Self Care | Payer: Medicare HMO | Attending: Emergency Medicine | Admitting: Emergency Medicine

## 2022-02-12 ENCOUNTER — Other Ambulatory Visit: Payer: Self-pay

## 2022-02-12 ENCOUNTER — Encounter (HOSPITAL_COMMUNITY): Payer: Self-pay | Admitting: Emergency Medicine

## 2022-02-12 DIAGNOSIS — W19XXXA Unspecified fall, initial encounter: Secondary | ICD-10-CM | POA: Diagnosis not present

## 2022-02-12 DIAGNOSIS — W19XXXD Unspecified fall, subsequent encounter: Secondary | ICD-10-CM | POA: Diagnosis not present

## 2022-02-12 DIAGNOSIS — R1310 Dysphagia, unspecified: Secondary | ICD-10-CM | POA: Diagnosis not present

## 2022-02-12 DIAGNOSIS — M25551 Pain in right hip: Secondary | ICD-10-CM | POA: Diagnosis not present

## 2022-02-12 DIAGNOSIS — Z7982 Long term (current) use of aspirin: Secondary | ICD-10-CM | POA: Insufficient documentation

## 2022-02-12 DIAGNOSIS — M25552 Pain in left hip: Secondary | ICD-10-CM | POA: Diagnosis not present

## 2022-02-12 DIAGNOSIS — M7989 Other specified soft tissue disorders: Secondary | ICD-10-CM | POA: Diagnosis not present

## 2022-02-12 DIAGNOSIS — M25561 Pain in right knee: Secondary | ICD-10-CM | POA: Diagnosis not present

## 2022-02-12 DIAGNOSIS — Z794 Long term (current) use of insulin: Secondary | ICD-10-CM | POA: Diagnosis not present

## 2022-02-12 DIAGNOSIS — Z7984 Long term (current) use of oral hypoglycemic drugs: Secondary | ICD-10-CM | POA: Insufficient documentation

## 2022-02-12 DIAGNOSIS — S82141D Displaced bicondylar fracture of right tibia, subsequent encounter for closed fracture with routine healing: Secondary | ICD-10-CM | POA: Diagnosis not present

## 2022-02-12 DIAGNOSIS — M25571 Pain in right ankle and joints of right foot: Secondary | ICD-10-CM | POA: Diagnosis not present

## 2022-02-12 DIAGNOSIS — E119 Type 2 diabetes mellitus without complications: Secondary | ICD-10-CM | POA: Insufficient documentation

## 2022-02-12 DIAGNOSIS — I10 Essential (primary) hypertension: Secondary | ICD-10-CM | POA: Insufficient documentation

## 2022-02-12 DIAGNOSIS — Z79899 Other long term (current) drug therapy: Secondary | ICD-10-CM | POA: Insufficient documentation

## 2022-02-12 DIAGNOSIS — R609 Edema, unspecified: Secondary | ICD-10-CM | POA: Diagnosis not present

## 2022-02-12 DIAGNOSIS — S8991XD Unspecified injury of right lower leg, subsequent encounter: Secondary | ICD-10-CM | POA: Diagnosis present

## 2022-02-12 LAB — GROUP A STREP BY PCR: Group A Strep by PCR: NOT DETECTED

## 2022-02-12 MED ORDER — HYDROCODONE-ACETAMINOPHEN 5-325 MG PO TABS: 1.0000 | ORAL_TABLET | ORAL | 0 refills | Status: DC | PRN

## 2022-02-12 MED ORDER — HYDROCODONE-ACETAMINOPHEN 5-325 MG PO TABS
1.0000 | ORAL_TABLET | Freq: Once | ORAL | Status: AC
  Administered 2022-02-12: 1 via ORAL
  Filled 2022-02-12: qty 1

## 2022-02-12 NOTE — ED Provider Notes (Signed)
[facility] DEPT Provider Note   CSN: [id number] Arrival date & time: 02/12/22  0845     History {Add pertinent medical, surgical, social history, OB history to HPI:1} Chief Complaint  Patient presents with  . Knee Pain  . Fracture    Mike Mack is a 86 y.o. male.   Patient as above with significant medical history as below, including *** who presents to the ED with complaint of ***     Past Medical History:  Diagnosis Date  . Anemia 05/13/2013  . Arthritis    "right leg" (07/25/2013)  . BPH (benign prostatic hyperplasia)   . Constipation 05/13/2013  . Exertional shortness of breath    "sometimes" (07/25/2013)  . History of kidney stones   . History of stomach ulcers   . Hypertension   . Neuropathy   . Polio   . Polio Childhood  . Small bowel obstruction ([place])   . Stroke [facility]) 2014   residual:  "left hand kind of numb" (07/25/2013)  . Type II diabetes mellitus ([place])     Past Surgical History:  Procedure Laterality Date  . BIOPSY  11/30/2020   Procedure: BIOPSY;  Surgeon: Ronnette Juniper, MD;  Location: [place];  Service: Gastroenterology;;  . BOWEL RESECTION    . CATARACT EXTRACTION W/ INTRAOCULAR LENS  IMPLANT, BILATERAL Bilateral 2000's  . CHOLECYSTECTOMY N/A 07/25/2013   Procedure: LAPAROSCOPIC CHOLECYSTECTOMY WITH INTRAOPERATIVE CHOLANGIOGRAM;  Surgeon: [name], MD;  Location: [facility];  Service: General;  Laterality: N/A;  . COLON SURGERY    . COLONOSCOPY     Hx: of  . CYSTOSCOPY WITH BIOPSY N/A 12/01/2019   Procedure: CYSTOSCOPY WITH BLADDER BIOPSY AND FULGURATION;  Surgeon: Lucas Mallow, MD;  Location: [facility];  Service: Urology;  Laterality: N/A;  . ESOPHAGOGASTRODUODENOSCOPY N/A 11/30/2020   Procedure: ESOPHAGOGASTRODUODENOSCOPY (EGD);  Surgeon: Ronnette Juniper, MD;  Location: [place] [facility];  Service: Gastroenterology;  Laterality: N/A;  . EXCISIONAL HEMORRHOIDECTOMY  2000's  . EXTRACORPOREAL SHOCK WAVE LITHOTRIPSY  Right 10/03/2018   Procedure: EXTRACORPOREAL SHOCK WAVE LITHOTRIPSY (ESWL);  Surgeon: Festus Aloe, MD;  Location: [facility];  Service: Urology;  Laterality: Right;  . EYE SURGERY    . INGUINAL HERNIA REPAIR Right 1970's  . IR RADIOLOGIST EVAL & MGMT  04/06/2020  . LAPAROSCOPIC CHOLECYSTECTOMY  07/25/2013   w/LOA (07/25/2013)      The history is provided by the patient and the spouse. No language interpreter was used.  Knee Pain      Home Medications Prior to Admission medications   Medication Sig Start Date End Date Taking? Authorizing Provider  acetaminophen (TYLENOL) 325 MG tablet Take 2 tablets (650 mg total) by mouth every 6 (six) hours as needed for mild pain (or Fever >/= 101). 11/27/20  Yes Debbe Odea, MD  amLODipine (NORVASC) 5 MG tablet Take 5 mg by mouth daily. 12/17/20  Yes [provider]  aspirin EC 81 MG tablet Take 1 tablet (81 mg total) by mouth daily. 12/16/20  Yes Lavina Hamman, MD  atorvastatin (LIPITOR) 20 MG tablet Take 20 mg by mouth every evening.  11/03/13  Yes [provider]  B Complex Vitamins (B COMPLEX PO) Take 1 tablet by mouth at bedtime.   Yes [provider]  brimonidine (ALPHAGAN) 0.2 % ophthalmic solution Place 1 drop into both eyes 3 (three) times daily.   Yes [provider]  cholecalciferol (VITAMIN D3) 10 MCG (400 UNIT) TABS tablet Take 400 Units by mouth  daily.   Yes [provider]  dorzolamide-timolol (COSOPT) 22.3-6.8 MG/ML ophthalmic solution Place 1 drop into both eyes 2 (two) times daily. 03/04/20  Yes [provider]  feeding supplement, GLUCERNA SHAKE, (GLUCERNA SHAKE) LIQD Take 237 mLs by mouth 3 (three) times daily between meals. Patient taking differently: Take 237 mLs by mouth 2 (two) times daily between meals. 03/25/20  Yes Kc, Maren Beach, MD  Finerenone (KERENDIA) 10 MG TABS Take 10 mg by mouth daily.   Yes [provider]  gabapentin (NEURONTIN) 600 MG tablet Take 600 mg by  mouth 2 (two) times daily as needed for headache. 01/21/21  Yes [provider]  HYDROcodone-acetaminophen (NORCO/VICODIN) 5-325 MG tablet Take 1 tablet by mouth every 4 (four) hours as needed. 02/02/22  Yes Tacy Learn, PA-C  insulin degludec (TRESIBA FLEXTOUCH) 100 UNIT/ML FlexTouch Pen Inject 15 Units into the skin daily. 09/08/20  Yes [provider]  latanoprost (XALATAN) 0.005 % ophthalmic solution Place 1 drop into both eyes at bedtime.  11/17/14  Yes [provider]  levothyroxine (SYNTHROID) 88 MCG tablet Take 88 mcg by mouth at bedtime.  04/21/11  Yes [provider]  memantine (NAMENDA) 10 MG tablet Take 10 mg by mouth 2 (two) times daily. 11/04/20  Yes [provider]  metoprolol tartrate (LOPRESSOR) 25 MG tablet Take 1 tablet (25 mg total) by mouth 2 (two) times daily. 12/09/20  Yes Lavina Hamman, MD  Multiple Vitamin (MULTIVITAMIN WITH MINERALS) TABS tablet Take 1 tablet by mouth daily. 09/06/13  Yes [name], MD  olmesartan (BENICAR) 20 MG tablet Take 20 mg by mouth daily. 02/13/21  Yes [provider]  pantoprazole (PROTONIX) 40 MG tablet Take 1 tablet (40 mg total) by mouth 2 (two) times daily before a meal. 12/09/20  Yes Lavina Hamman, MD  pioglitazone (ACTOS) 30 MG tablet Take 30 mg by mouth daily. 12/16/20  Yes [provider]  polyethylene glycol (MIRALAX / GLYCOLAX) packet Take 17 g by mouth daily. Patient taking differently: Take 17 g by mouth daily as needed for mild constipation. 09/06/13  Yes [name], MD  RHOPRESSA 0.02 % SOLN Place 1 drop into both eyes at bedtime. 03/05/20  Yes [provider]  Tamsulosin HCl (FLOMAX) 0.4 MG CAPS Take 0.4 mg by mouth in the morning and at bedtime.   Yes [provider]  ondansetron (ZOFRAN-ODT) 8 MG disintegrating tablet Take 1 tablet (8 mg total) by mouth every 8 (eight) hours as needed for nausea. Patient not taking: Reported on 02/12/2022  08/28/21   Varney Biles, MD      Allergies    Metformin and related, Other, and Soliqua [insulin glargine-lixisenatide]    Review of Systems   Review of Systems  Physical Exam Updated Vital Signs BP (!) 152/72 (BP Location: Left Arm)   Pulse 80   Temp 97.6 F (36.4 C) (Oral)   Resp 16   SpO2 100%  Physical Exam  ED Results / Procedures / Treatments   Labs (all labs ordered are listed, but only abnormal results are displayed) Labs Reviewed  GROUP A STREP BY PCR    EKG None  Radiology CT Knee Right Wo Contrast  Result Date: 02/12/2022 CLINICAL DATA:  Right knee pain after fall EXAM: CT OF THE RIGHT KNEE WITHOUT CONTRAST TECHNIQUE: Multidetector CT imaging of the right knee was performed according to the standard protocol. Multiplanar CT image reconstructions were also generated. RADIATION DOSE REDUCTION: This exam was performed  according to the departmental dose-optimization program which includes automated exposure control, adjustment of the mA and/or kV according to patient size and/or use of iterative reconstruction technique. COMPARISON:  X-ray 02/12/2022 FINDINGS: Bones/Joint/Cartilage Bones are markedly demineralized and somewhat gracile in appearance. Acute mildly impacted fracture at the medial tibial plateau (series 7, images 72-74). There is intra-articular extension into the knee joint at the tibial eminence (series 3, image 111). No articular surface depression or diastasis. No additional fractures are seen. Postsurgical changes of prior tunneling through the patella. Moderate-sized knee joint hemarthrosis. Ligaments Suboptimally assessed by CT. Muscles and Tendons Profound fatty atrophy of the lower extremity musculature. No evidence of acute tendinous injury. Soft tissues Soft tissue swelling at the anterolateral aspect of the knee. IMPRESSION: 1. Acute mildly impacted fracture of the medial tibial plateau with intra-articular extension into the knee joint at the tibial  eminence. 2. Moderate-sized knee joint hemarthrosis. Electronically Signed   By: Davina Poke D.O.   On: 02/12/2022 11:55   DG Hip Unilat W or Wo Pelvis 2-3 Views Left  Result Date: 02/12/2022 CLINICAL DATA:  Left hip pain after fall. EXAM: DG HIP (WITH OR WITHOUT PELVIS) 2-3V LEFT COMPARISON:  None Available. FINDINGS: There is no evidence of hip fracture or dislocation. There is no evidence of arthropathy or other focal bone abnormality. IMPRESSION: Negative. Electronically Signed   By: Marijo Conception M.D.   On: 02/12/2022 10:38   DG Hip Unilat W or Wo Pelvis 2-3 Views Right  Result Date: 02/12/2022 CLINICAL DATA:  Right hip pain after fall. EXAM: DG HIP (WITH OR WITHOUT PELVIS) 2-3V RIGHT COMPARISON:  None Available. FINDINGS: There is no evidence of hip fracture or dislocation. There is no evidence of arthropathy or other focal bone abnormality. IMPRESSION: Negative. Electronically Signed   By: Marijo Conception M.D.   On: 02/12/2022 10:37   DG Knee Complete 4 Views Right  Result Date: 02/12/2022 CLINICAL DATA:  Right knee pain after fall. EXAM: RIGHT KNEE - COMPLETE 4+ VIEW COMPARISON:  None Available. FINDINGS: Moderate suprapatellar joint effusion is noted. Patellar spurring is noted. There appears to be a mildly displaced proximal tibial fracture. IMPRESSION: Probable mildly displaced proximal tibial fracture. CT scan is recommended for further evaluation. Electronically Signed   By: Marijo Conception M.D.   On: 02/12/2022 10:36   DG Ankle Complete Right  Result Date: 02/12/2022 CLINICAL DATA:  Right ankle pain after fall. EXAM: RIGHT ANKLE - COMPLETE 3+ VIEW COMPARISON:  None Available. FINDINGS: Diffuse osteopenia is noted. Vascular calcifications are noted. No definite fracture or dislocation is noted. IMPRESSION: Diffuse osteopenia.  No definite acute abnormality seen. Electronically Signed   By: Marijo Conception M.D.   On: 02/12/2022 10:33    Procedures Procedures  {Document cardiac  monitor, telemetry assessment procedure when appropriate:1}  Medications Ordered in ED Medications  HYDROcodone-acetaminophen (NORCO/VICODIN) 5-325 MG per tablet 1 tablet (has no administration in time range)  HYDROcodone-acetaminophen (NORCO/VICODIN) 5-325 MG per tablet 1 tablet (1 tablet Oral Given 02/12/22 0943)    ED Course/ Medical Decision Making/ A&P Clinical Course as of 02/12/22 1506  Sun Feb 12, 2022  1500 IMPRESSION: 1. Acute mildly impacted fracture of the medial tibial plateau with intra-articular extension into the knee joint at the tibial eminence. 2. Moderate-sized knee joint hemarthrosis   [SG]    Clinical Course User Index [SG] Jeanell Sparrow, DO  Medical Decision Making Amount and/or Complexity of Data Reviewed Radiology: ordered.  Risk Prescription drug management.   This patient presents to the ED with chief complaint(s) of *** with pertinent past medical history of *** which further complicates the presenting complaint. The complaint involves an extensive differential diagnosis and also carries with it a high risk of complications and morbidity.    The differential diagnosis includes but not limited to ***. Serious etiologies were considered.   The initial plan is to ***   Additional history obtained: Additional history obtained from {additional history:26846} Records reviewed {records:26847}  Independent labs interpretation:  The following labs were independently interpreted: ***  Independent visualization of imaging: - I independently visualized the following imaging with scope of interpretation limited to determining acute life threatening conditions related to emergency care: ***, which revealed ***  Cardiac monitoring was reviewed and interpreted by myself which shows ***  Treatment and Reassessment: ***  Consultation: - Consulted or discussed management/test interpretation w/ external professional:  ***  Consideration for admission or further workup: Admission was considered ***  Social Determinants of health: Social History   Tobacco Use  . Smoking status: Never  . Smokeless tobacco: Never  Vaping Use  . Vaping Use: Never used  Substance Use Topics  . Alcohol use: No  . Drug use: No      {Document critical care time when appropriate:1} {Document review of labs and clinical decision tools ie heart score, Chads2Vasc2 etc:1}  {Document your independent review of radiology images, and any outside records:1} {Document your discussion with family members, caretakers, and with consultants:1} {Document social determinants of health affecting pt's care:1} {Document your decision making why or why not admission, treatments were needed:1} Final Clinical Impression(s) / ED Diagnoses Final diagnoses:  None    Rx / DC Orders ED Discharge Orders     None

## 2022-02-12 NOTE — Discharge Instructions (Addendum)
Please continue to wear knee immobilizer, follow-up orthopedics   It was a pleasure caring for you today in the emergency department.  Please return to the emergency department for any worsening or worrisome symptoms.

## 2022-02-12 NOTE — ED Triage Notes (Signed)
Pt BIB EMS from home, c/o severe knee pain r/t fracture. Seen by PCP who removed knee stabilizer last week. Has experienced increased pain since then with no relief from pain medications.   BP 132/92 P 83 spO2 98% RR 18

## 2022-02-12 NOTE — ED Notes (Signed)
Pt provided with sandwich and water.

## 2022-02-28 ENCOUNTER — Inpatient Hospital Stay (HOSPITAL_COMMUNITY): Payer: Medicare HMO

## 2022-02-28 ENCOUNTER — Inpatient Hospital Stay (HOSPITAL_COMMUNITY)
Admission: EM | Admit: 2022-02-28 | Discharge: 2022-03-09 | DRG: 871 | Disposition: A | Discharge status: Skilled Nursing Facility | Payer: Medicare HMO | Attending: Internal Medicine | Admitting: Internal Medicine

## 2022-02-28 ENCOUNTER — Other Ambulatory Visit: Payer: Self-pay

## 2022-02-28 ENCOUNTER — Encounter (HOSPITAL_COMMUNITY): Payer: Self-pay

## 2022-02-28 ENCOUNTER — Emergency Department (HOSPITAL_COMMUNITY): Payer: Medicare HMO

## 2022-02-28 DIAGNOSIS — E876 Hypokalemia: Secondary | ICD-10-CM | POA: Diagnosis not present

## 2022-02-28 DIAGNOSIS — Z79899 Other long term (current) drug therapy: Secondary | ICD-10-CM | POA: Diagnosis not present

## 2022-02-28 DIAGNOSIS — I129 Hypertensive chronic kidney disease with stage 1 through stage 4 chronic kidney disease, or unspecified chronic kidney disease: Secondary | ICD-10-CM | POA: Diagnosis present

## 2022-02-28 DIAGNOSIS — E1122 Type 2 diabetes mellitus with diabetic chronic kidney disease: Secondary | ICD-10-CM | POA: Diagnosis present

## 2022-02-28 DIAGNOSIS — D649 Anemia, unspecified: Secondary | ICD-10-CM | POA: Diagnosis not present

## 2022-02-28 DIAGNOSIS — K209 Esophagitis, unspecified without bleeding: Secondary | ICD-10-CM | POA: Diagnosis present

## 2022-02-28 DIAGNOSIS — N400 Enlarged prostate without lower urinary tract symptoms: Secondary | ICD-10-CM | POA: Diagnosis present

## 2022-02-28 DIAGNOSIS — N481 Balanitis: Secondary | ICD-10-CM | POA: Diagnosis not present

## 2022-02-28 DIAGNOSIS — K259 Gastric ulcer, unspecified as acute or chronic, without hemorrhage or perforation: Secondary | ICD-10-CM | POA: Diagnosis not present

## 2022-02-28 DIAGNOSIS — Z8249 Family history of ischemic heart disease and other diseases of the circulatory system: Secondary | ICD-10-CM

## 2022-02-28 DIAGNOSIS — Z794 Long term (current) use of insulin: Secondary | ICD-10-CM | POA: Diagnosis not present

## 2022-02-28 DIAGNOSIS — K264 Chronic or unspecified duodenal ulcer with hemorrhage: Secondary | ICD-10-CM | POA: Diagnosis not present

## 2022-02-28 DIAGNOSIS — R32 Unspecified urinary incontinence: Secondary | ICD-10-CM | POA: Diagnosis present

## 2022-02-28 DIAGNOSIS — K921 Melena: Secondary | ICD-10-CM | POA: Diagnosis not present

## 2022-02-28 DIAGNOSIS — K269 Duodenal ulcer, unspecified as acute or chronic, without hemorrhage or perforation: Secondary | ICD-10-CM | POA: Diagnosis not present

## 2022-02-28 DIAGNOSIS — N1832 Chronic kidney disease, stage 3b: Secondary | ICD-10-CM | POA: Diagnosis present

## 2022-02-28 DIAGNOSIS — N390 Urinary tract infection, site not specified: Secondary | ICD-10-CM | POA: Diagnosis present

## 2022-02-28 DIAGNOSIS — N472 Paraphimosis: Secondary | ICD-10-CM | POA: Diagnosis not present

## 2022-02-28 DIAGNOSIS — S3993XA Unspecified injury of pelvis, initial encounter: Secondary | ICD-10-CM | POA: Diagnosis not present

## 2022-02-28 DIAGNOSIS — R6521 Severe sepsis with septic shock: Secondary | ICD-10-CM | POA: Diagnosis not present

## 2022-02-28 DIAGNOSIS — E1140 Type 2 diabetes mellitus with diabetic neuropathy, unspecified: Secondary | ICD-10-CM | POA: Diagnosis not present

## 2022-02-28 DIAGNOSIS — N281 Cyst of kidney, acquired: Secondary | ICD-10-CM | POA: Diagnosis not present

## 2022-02-28 DIAGNOSIS — Z7982 Long term (current) use of aspirin: Secondary | ICD-10-CM

## 2022-02-28 DIAGNOSIS — R1311 Dysphagia, oral phase: Secondary | ICD-10-CM | POA: Diagnosis present

## 2022-02-28 DIAGNOSIS — A419 Sepsis, unspecified organism: Secondary | ICD-10-CM | POA: Diagnosis not present

## 2022-02-28 DIAGNOSIS — K319 Disease of stomach and duodenum, unspecified: Secondary | ICD-10-CM | POA: Diagnosis not present

## 2022-02-28 DIAGNOSIS — E860 Dehydration: Secondary | ICD-10-CM | POA: Diagnosis present

## 2022-02-28 DIAGNOSIS — I69351 Hemiplegia and hemiparesis following cerebral infarction affecting right dominant side: Secondary | ICD-10-CM

## 2022-02-28 DIAGNOSIS — D62 Acute posthemorrhagic anemia: Secondary | ICD-10-CM | POA: Diagnosis not present

## 2022-02-28 DIAGNOSIS — N179 Acute kidney failure, unspecified: Secondary | ICD-10-CM | POA: Diagnosis not present

## 2022-02-28 DIAGNOSIS — Z79890 Hormone replacement therapy (postmenopausal): Secondary | ICD-10-CM

## 2022-02-28 DIAGNOSIS — K3189 Other diseases of stomach and duodenum: Secondary | ICD-10-CM | POA: Diagnosis not present

## 2022-02-28 DIAGNOSIS — R4182 Altered mental status, unspecified: Secondary | ICD-10-CM | POA: Diagnosis not present

## 2022-02-28 DIAGNOSIS — E870 Hyperosmolality and hypernatremia: Secondary | ICD-10-CM | POA: Diagnosis present

## 2022-02-28 DIAGNOSIS — E039 Hypothyroidism, unspecified: Secondary | ICD-10-CM | POA: Diagnosis present

## 2022-02-28 DIAGNOSIS — K2091 Esophagitis, unspecified with bleeding: Secondary | ICD-10-CM | POA: Diagnosis not present

## 2022-02-28 DIAGNOSIS — E875 Hyperkalemia: Secondary | ICD-10-CM | POA: Diagnosis present

## 2022-02-28 DIAGNOSIS — R001 Bradycardia, unspecified: Secondary | ICD-10-CM | POA: Diagnosis not present

## 2022-02-28 DIAGNOSIS — I10 Essential (primary) hypertension: Secondary | ICD-10-CM | POA: Diagnosis not present

## 2022-02-28 DIAGNOSIS — S82141A Displaced bicondylar fracture of right tibia, initial encounter for closed fracture: Secondary | ICD-10-CM | POA: Diagnosis not present

## 2022-02-28 DIAGNOSIS — K449 Diaphragmatic hernia without obstruction or gangrene: Secondary | ICD-10-CM | POA: Diagnosis present

## 2022-02-28 DIAGNOSIS — R109 Unspecified abdominal pain: Secondary | ICD-10-CM | POA: Diagnosis not present

## 2022-02-28 DIAGNOSIS — L8990 Pressure ulcer of unspecified site, unspecified stage: Secondary | ICD-10-CM | POA: Insufficient documentation

## 2022-02-28 DIAGNOSIS — Z7401 Bed confinement status: Secondary | ICD-10-CM | POA: Diagnosis not present

## 2022-02-28 DIAGNOSIS — D638 Anemia in other chronic diseases classified elsewhere: Secondary | ICD-10-CM | POA: Diagnosis not present

## 2022-02-28 DIAGNOSIS — E878 Other disorders of electrolyte and fluid balance, not elsewhere classified: Secondary | ICD-10-CM | POA: Diagnosis present

## 2022-02-28 DIAGNOSIS — K922 Gastrointestinal hemorrhage, unspecified: Secondary | ICD-10-CM | POA: Diagnosis not present

## 2022-02-28 DIAGNOSIS — K254 Chronic or unspecified gastric ulcer with hemorrhage: Secondary | ICD-10-CM | POA: Diagnosis not present

## 2022-02-28 DIAGNOSIS — Z6827 Body mass index (BMI) 27.0-27.9, adult: Secondary | ICD-10-CM

## 2022-02-28 DIAGNOSIS — E46 Unspecified protein-calorie malnutrition: Secondary | ICD-10-CM | POA: Diagnosis present

## 2022-02-28 DIAGNOSIS — Z833 Family history of diabetes mellitus: Secondary | ICD-10-CM

## 2022-02-28 DIAGNOSIS — I959 Hypotension, unspecified: Secondary | ICD-10-CM | POA: Diagnosis not present

## 2022-02-28 DIAGNOSIS — Z452 Encounter for adjustment and management of vascular access device: Secondary | ICD-10-CM | POA: Diagnosis not present

## 2022-02-28 DIAGNOSIS — M25461 Effusion, right knee: Secondary | ICD-10-CM | POA: Diagnosis not present

## 2022-02-28 DIAGNOSIS — Z66 Do not resuscitate: Secondary | ICD-10-CM | POA: Diagnosis not present

## 2022-02-28 DIAGNOSIS — J90 Pleural effusion, not elsewhere classified: Secondary | ICD-10-CM | POA: Diagnosis not present

## 2022-02-28 DIAGNOSIS — D500 Iron deficiency anemia secondary to blood loss (chronic): Secondary | ICD-10-CM | POA: Diagnosis not present

## 2022-02-28 DIAGNOSIS — R5383 Other fatigue: Secondary | ICD-10-CM | POA: Diagnosis not present

## 2022-02-28 DIAGNOSIS — E119 Type 2 diabetes mellitus without complications: Secondary | ICD-10-CM | POA: Diagnosis not present

## 2022-02-28 DIAGNOSIS — N4889 Other specified disorders of penis: Secondary | ICD-10-CM | POA: Diagnosis not present

## 2022-02-28 DIAGNOSIS — M1711 Unilateral primary osteoarthritis, right knee: Secondary | ICD-10-CM | POA: Diagnosis not present

## 2022-02-28 DIAGNOSIS — R739 Hyperglycemia, unspecified: Secondary | ICD-10-CM | POA: Diagnosis not present

## 2022-02-28 DIAGNOSIS — R079 Chest pain, unspecified: Secondary | ICD-10-CM | POA: Diagnosis not present

## 2022-02-28 DIAGNOSIS — R0902 Hypoxemia: Secondary | ICD-10-CM | POA: Diagnosis not present

## 2022-02-28 LAB — URINALYSIS, ROUTINE W REFLEX MICROSCOPIC
Bilirubin Urine: NEGATIVE
Glucose, UA: NEGATIVE mg/dL
Ketones, ur: 5 mg/dL — AB
Nitrite: NEGATIVE
Protein, ur: 30 mg/dL — AB
RBC / HPF: >50 RBC/hpf — ABNORMAL HIGH (ref 0–5)
Specific Gravity, Urine: 1.017 (ref 1.005–1.030)
WBC, UA: >50 WBC/hpf — ABNORMAL HIGH (ref 0–5)
pH: 5 (ref 5.0–8.0)

## 2022-02-28 LAB — I-STAT CHEM 8, ED
BUN: >130 mg/dL — ABNORMAL HIGH (ref 8–23)
Calcium, Ion: 1.2 mmol/L (ref 1.15–1.40)
Chloride: 105 mmol/L (ref 98–111)
Creatinine, Ser: 5.5 mg/dL — ABNORMAL HIGH (ref 0.61–1.24)
Glucose, Bld: 216 mg/dL — ABNORMAL HIGH (ref 70–99)
HCT: 43 % (ref 39.0–52.0)
Hemoglobin: 14.6 g/dL (ref 13.0–17.0)
Potassium: 5.1 mmol/L (ref 3.5–5.1)
Sodium: 138 mmol/L (ref 135–145)
TCO2: 17 mmol/L — ABNORMAL LOW (ref 22–32)

## 2022-02-28 LAB — COMPREHENSIVE METABOLIC PANEL
ALT: 16 U/L (ref 0–44)
ALT: 14 U/L (ref 0–44)
AST: 11 U/L — ABNORMAL LOW (ref 15–41)
AST: 12 U/L — ABNORMAL LOW (ref 15–41)
Albumin: 2.4 g/dL — ABNORMAL LOW (ref 3.5–5.0)
Albumin: 2 g/dL — ABNORMAL LOW (ref 3.5–5.0)
Alkaline Phosphatase: 96 U/L (ref 38–126)
Alkaline Phosphatase: 79 U/L (ref 38–126)
Anion gap: 12 (ref 5–15)
Anion gap: 11 (ref 5–15)
BUN: 171 mg/dL — ABNORMAL HIGH (ref 8–23)
BUN: 154 mg/dL — ABNORMAL HIGH (ref 8–23)
CO2: 16 mmol/L — ABNORMAL LOW (ref 22–32)
CO2: 16 mmol/L — ABNORMAL LOW (ref 22–32)
Calcium: 8.8 mg/dL — ABNORMAL LOW (ref 8.9–10.3)
Calcium: 8 mg/dL — ABNORMAL LOW (ref 8.9–10.3)
Chloride: 110 mmol/L (ref 98–111)
Chloride: 109 mmol/L (ref 98–111)
Creatinine, Ser: 5.44 mg/dL — ABNORMAL HIGH (ref 0.61–1.24)
Creatinine, Ser: 4.16 mg/dL — ABNORMAL HIGH (ref 0.61–1.24)
GFR, Estimated: 10 mL/min — ABNORMAL LOW (ref >60)
GFR, Estimated: 13 mL/min — ABNORMAL LOW (ref >60)
Glucose, Bld: 225 mg/dL — ABNORMAL HIGH (ref 70–99)
Glucose, Bld: 232 mg/dL — ABNORMAL HIGH (ref 70–99)
Potassium: 5.4 mmol/L — ABNORMAL HIGH (ref 3.5–5.1)
Potassium: 4.7 mmol/L (ref 3.5–5.1)
Sodium: 138 mmol/L (ref 135–145)
Sodium: 136 mmol/L (ref 135–145)
Total Bilirubin: 1 mg/dL (ref 0.3–1.2)
Total Bilirubin: 1 mg/dL (ref 0.3–1.2)
Total Protein: 6.6 g/dL (ref 6.5–8.1)
Total Protein: 5.3 g/dL — ABNORMAL LOW (ref 6.5–8.1)

## 2022-02-28 LAB — CBC WITH DIFFERENTIAL/PLATELET
Abs Immature Granulocytes: 0.08 10*3/uL — ABNORMAL HIGH (ref 0.00–0.07)
Basophils Absolute: 0 10*3/uL (ref 0.0–0.1)
Basophils Relative: 0 %
Eosinophils Absolute: 0 10*3/uL (ref 0.0–0.5)
Eosinophils Relative: 0 %
HCT: 36.6 % — ABNORMAL LOW (ref 39.0–52.0)
Hemoglobin: 12.1 g/dL — ABNORMAL LOW (ref 13.0–17.0)
Immature Granulocytes: 1 %
Lymphocytes Relative: 10 %
Lymphs Abs: 0.9 10*3/uL (ref 0.7–4.0)
MCH: 32.4 pg (ref 26.0–34.0)
MCHC: 33.1 g/dL (ref 30.0–36.0)
MCV: 98.1 fL (ref 80.0–100.0)
Monocytes Absolute: 0.7 10*3/uL (ref 0.1–1.0)
Monocytes Relative: 7 %
Neutro Abs: 7.3 10*3/uL (ref 1.7–7.7)
Neutrophils Relative %: 82 %
Platelets: 248 10*3/uL (ref 150–400)
RBC: 3.73 MIL/uL — ABNORMAL LOW (ref 4.22–5.81)
RDW: 13.6 % (ref 11.5–15.5)
WBC: 9 10*3/uL (ref 4.0–10.5)
nRBC: 0 % (ref 0.0–0.2)

## 2022-02-28 LAB — BLOOD GAS, VENOUS
Acid-base deficit: 9.3 mmol/L — ABNORMAL HIGH (ref 0.0–2.0)
Bicarbonate: 17.1 mmol/L — ABNORMAL LOW (ref 20.0–28.0)
O2 Saturation: 26.8 %
Patient temperature: 37
pCO2, Ven: 38 mmHg — ABNORMAL LOW (ref 44–60)
pH, Ven: 7.26 (ref 7.25–7.43)
pO2, Ven: <31 mmHg — CL (ref 32–45)

## 2022-02-28 LAB — LACTIC ACID, PLASMA
Lactic Acid, Venous: 1.1 mmol/L (ref 0.5–1.9)
Lactic Acid, Venous: 2.1 mmol/L (ref 0.5–1.9)
Lactic Acid, Venous: 1.2 mmol/L (ref 0.5–1.9)
Lactic Acid, Venous: 1 mmol/L (ref 0.5–1.9)
Lactic Acid, Venous: 0.7 mmol/L (ref 0.5–1.9)

## 2022-02-28 LAB — CBC
HCT: 31.3 % — ABNORMAL LOW (ref 39.0–52.0)
Hemoglobin: 10.2 g/dL — ABNORMAL LOW (ref 13.0–17.0)
MCH: 31.7 pg (ref 26.0–34.0)
MCHC: 32.6 g/dL (ref 30.0–36.0)
MCV: 97.2 fL (ref 80.0–100.0)
Platelets: 204 10*3/uL (ref 150–400)
RBC: 3.22 MIL/uL — ABNORMAL LOW (ref 4.22–5.81)
RDW: 13.6 % (ref 11.5–15.5)
WBC: 9.8 10*3/uL (ref 4.0–10.5)
nRBC: 0 % (ref 0.0–0.2)

## 2022-02-28 LAB — HEMOGLOBIN A1C
Hgb A1c MFr Bld: 7.5 % — ABNORMAL HIGH (ref 4.8–5.6)
Mean Plasma Glucose: 168.55 mg/dL

## 2022-02-28 LAB — PROTIME-INR
INR: 1.4 — ABNORMAL HIGH (ref 0.8–1.2)
Prothrombin Time: 16.9 seconds — ABNORMAL HIGH (ref 11.4–15.2)

## 2022-02-28 LAB — APTT: aPTT: 33 seconds (ref 24–36)

## 2022-02-28 LAB — CREATININE, SERUM
Creatinine, Ser: 4.67 mg/dL — ABNORMAL HIGH (ref 0.61–1.24)
GFR, Estimated: 12 mL/min — ABNORMAL LOW (ref >60)

## 2022-02-28 LAB — MAGNESIUM: Magnesium: 2.5 mg/dL — ABNORMAL HIGH (ref 1.7–2.4)

## 2022-02-28 LAB — MRSA NEXT GEN BY PCR, NASAL: MRSA by PCR Next Gen: NOT DETECTED

## 2022-02-28 LAB — GLUCOSE, CAPILLARY
Glucose-Capillary: 205 mg/dL — ABNORMAL HIGH (ref 70–99)
Glucose-Capillary: 207 mg/dL — ABNORMAL HIGH (ref 70–99)
Glucose-Capillary: 223 mg/dL — ABNORMAL HIGH (ref 70–99)

## 2022-02-28 LAB — PHOSPHORUS: Phosphorus: 4.5 mg/dL (ref 2.5–4.6)

## 2022-02-28 MED ORDER — LACTATED RINGERS IV BOLUS (SEPSIS): 1000.0000 mL | Freq: Once | INTRAVENOUS | Status: DC

## 2022-02-28 MED ORDER — LACTATED RINGERS IV BOLUS (SEPSIS)
250.0000 mL | Freq: Once | INTRAVENOUS | Status: AC
  Administered 2022-02-28: 250 mL via INTRAVENOUS

## 2022-02-28 MED ORDER — VANCOMYCIN HCL IN DEXTROSE 1-5 GM/200ML-% IV SOLN: 1000.0000 mg | Freq: Once | INTRAVENOUS | Status: DC

## 2022-02-28 MED ORDER — HEPARIN SODIUM (PORCINE) 5000 UNIT/ML IJ SOLN
5000.0000 [IU] | Freq: Three times a day (TID) | INTRAMUSCULAR | Status: DC
  Administered 2022-02-28 – 2022-03-02 (×5): 5000 [IU] via SUBCUTANEOUS
  Filled 2022-02-28 (×5): qty 1

## 2022-02-28 MED ORDER — SODIUM CHLORIDE 0.9 % IV SOLN
2.0000 g | INTRAVENOUS | Status: AC
  Administered 2022-03-01 – 2022-03-02 (×2): 2 g via INTRAVENOUS
  Filled 2022-02-28 (×2): qty 12.5

## 2022-02-28 MED ORDER — NETARSUDIL DIMESYLATE 0.02 % OP SOLN
1.0000 [drp] | Freq: Every day | OPHTHALMIC | Status: DC
  Administered 2022-03-03: 1 [drp] via OPHTHALMIC

## 2022-02-28 MED ORDER — LEVOTHYROXINE SODIUM 88 MCG PO TABS
88.0000 ug | ORAL_TABLET | Freq: Every day | ORAL | Status: DC
  Administered 2022-03-01 – 2022-03-09 (×8): 88 ug via ORAL
  Filled 2022-02-28 (×7): qty 1

## 2022-02-28 MED ORDER — NOREPINEPHRINE 4 MG/250ML-% IV SOLN
0.0000 ug/min | INTRAVENOUS | Status: DC
  Administered 2022-02-28: 2 ug/min via INTRAVENOUS
  Filled 2022-02-28: qty 250

## 2022-02-28 MED ORDER — NOREPINEPHRINE 4 MG/250ML-% IV SOLN
0.0000 ug/min | INTRAVENOUS | Status: DC
  Administered 2022-02-28: 19 ug/min via INTRAVENOUS
  Administered 2022-02-28: 18 ug/min via INTRAVENOUS
  Administered 2022-02-28 – 2022-03-01 (×2): 15 ug/min via INTRAVENOUS
  Administered 2022-03-01: 3 ug/min via INTRAVENOUS
  Administered 2022-03-02: 2 ug/min via INTRAVENOUS
  Filled 2022-02-28 (×4): qty 250

## 2022-02-28 MED ORDER — NOREPINEPHRINE 4 MG/250ML-% IV SOLN
2.0000 ug/min | INTRAVENOUS | Status: DC
  Administered 2022-02-28: 10 ug/min via INTRAVENOUS

## 2022-02-28 MED ORDER — ORAL CARE MOUTH RINSE: 15.0000 mL | OROMUCOSAL | Status: DC | PRN

## 2022-02-28 MED ORDER — LACTATED RINGERS IV BOLUS (SEPSIS)
1000.0000 mL | Freq: Once | INTRAVENOUS | Status: AC
  Administered 2022-02-28: 1000 mL via INTRAVENOUS

## 2022-02-28 MED ORDER — SODIUM CHLORIDE 0.9 % IV SOLN: 250.0000 mL | INTRAVENOUS | Status: DC

## 2022-02-28 MED ORDER — METRONIDAZOLE 500 MG/100ML IV SOLN
500.0000 mg | Freq: Once | INTRAVENOUS | Status: AC
  Administered 2022-02-28: 500 mg via INTRAVENOUS
  Filled 2022-02-28: qty 100

## 2022-02-28 MED ORDER — BRIMONIDINE TARTRATE 0.2 % OP SOLN
1.0000 [drp] | Freq: Three times a day (TID) | OPHTHALMIC | Status: DC
  Administered 2022-02-28 – 2022-03-09 (×26): 1 [drp] via OPHTHALMIC
  Filled 2022-02-28: qty 5

## 2022-02-28 MED ORDER — LATANOPROST 0.005 % OP SOLN
1.0000 [drp] | Freq: Every day | OPHTHALMIC | Status: DC
  Administered 2022-02-28 – 2022-03-08 (×8): 1 [drp] via OPHTHALMIC
  Filled 2022-02-28: qty 2.5

## 2022-02-28 MED ORDER — DORZOLAMIDE HCL-TIMOLOL MAL 2-0.5 % OP SOLN
1.0000 [drp] | Freq: Two times a day (BID) | OPHTHALMIC | Status: DC
  Administered 2022-02-28 – 2022-03-09 (×17): 1 [drp] via OPHTHALMIC
  Filled 2022-02-28: qty 10

## 2022-02-28 MED ORDER — VANCOMYCIN HCL 1500 MG/300ML IV SOLN
1500.0000 mg | Freq: Once | INTRAVENOUS | Status: AC
  Administered 2022-02-28: 1500 mg via INTRAVENOUS
  Filled 2022-02-28: qty 300

## 2022-02-28 MED ORDER — DOCUSATE SODIUM 100 MG PO CAPS: 100.0000 mg | ORAL_CAPSULE | Freq: Two times a day (BID) | ORAL | Status: DC | PRN

## 2022-02-28 MED ORDER — LACTATED RINGERS IV BOLUS: 1000.0000 mL | Freq: Once | INTRAVENOUS | Status: DC

## 2022-02-28 MED ORDER — POLYETHYLENE GLYCOL 3350 17 G PO PACK: 17.0000 g | PACK | Freq: Every day | ORAL | Status: DC | PRN

## 2022-02-28 MED ORDER — INSULIN ASPART 100 UNIT/ML IJ SOLN
0.0000 [IU] | INTRAMUSCULAR | Status: DC
  Administered 2022-02-28 – 2022-03-01 (×5): 3 [IU] via SUBCUTANEOUS
  Administered 2022-03-01: 2 [IU] via SUBCUTANEOUS
  Administered 2022-03-01: 3 [IU] via SUBCUTANEOUS
  Administered 2022-03-01: 2 [IU] via SUBCUTANEOUS
  Administered 2022-03-02: 3 [IU] via SUBCUTANEOUS
  Administered 2022-03-02 (×5): 2 [IU] via SUBCUTANEOUS
  Administered 2022-03-03: 1 [IU] via SUBCUTANEOUS
  Administered 2022-03-03: 2 [IU] via SUBCUTANEOUS
  Administered 2022-03-03 – 2022-03-05 (×7): 1 [IU] via SUBCUTANEOUS
  Administered 2022-03-05: 2 [IU] via SUBCUTANEOUS
  Administered 2022-03-06: 1 [IU] via SUBCUTANEOUS
  Administered 2022-03-06 (×2): 3 [IU] via SUBCUTANEOUS
  Administered 2022-03-06: 1 [IU] via SUBCUTANEOUS
  Administered 2022-03-07 (×4): 2 [IU] via SUBCUTANEOUS
  Administered 2022-03-08 – 2022-03-09 (×5): 1 [IU] via SUBCUTANEOUS
  Filled 2022-02-28: qty 0.09

## 2022-02-28 MED ORDER — LACTATED RINGERS IV SOLN: INTRAVENOUS | Status: AC

## 2022-02-28 MED ORDER — SODIUM CHLORIDE 0.9 % IV SOLN
2.0000 g | Freq: Once | INTRAVENOUS | Status: AC
  Administered 2022-02-28: 2 g via INTRAVENOUS
  Filled 2022-02-28: qty 12.5

## 2022-02-28 MED ORDER — CHLORHEXIDINE GLUCONATE CLOTH 2 % EX PADS
6.0000 | MEDICATED_PAD | Freq: Every day | CUTANEOUS | Status: DC
  Administered 2022-02-28: 6 via TOPICAL

## 2022-02-28 NOTE — Procedures (Signed)
Central Venous Catheter Insertion Procedure Note  Mike Mack  [id number]  09-30-1935  Date:02/28/22  Time:4:51 PM   Provider Performing:Mike Mack   Procedure: Insertion of Non-tunneled Central Venous [phone]) with US guidance ([id number])   Indication(s) Medication administration  Consent Risks of the procedure as well as the alternatives and risks of each were explained to the patient and/or caregiver.  Consent for the procedure was obtained and is signed in the bedside chart  Anesthesia Topical only with 1% lidocaine   Timeout Verified patient identification, verified procedure, site/side was marked, verified correct patient position, special equipment/implants available, medications/allergies/relevant history reviewed, required imaging and test results available.  Sterile Technique Maximal sterile technique including full sterile barrier drape, hand hygiene, sterile gown, sterile gloves, mask, hair covering, sterile ultrasound probe cover (if used).  Procedure Description Area of catheter insertion was cleaned with chlorhexidine and draped in sterile fashion.  With real-time ultrasound guidance a central venous catheter was placed into the right internal jugular vein. Nonpulsatile blood flow and easy flushing noted in all ports.  The catheter was sutured in place and sterile dressing applied.  Complications/Tolerance None; patient tolerated the procedure well. Chest X-ray is ordered to verify placement for internal jugular or subclavian cannulation.   Chest x-ray is not ordered for femoral cannulation.  EBL Minimal  Specimen(s) None   Mike Mack [name], PA-C

## 2022-02-28 NOTE — H&P (Addendum)
NAME:  Mike Mack, MRN:  [id number], DOB:  1935-08-19, LOS: 0 ADMISSION DATE:  02/28/2022, CONSULTATION DATE:  02/28/22 REFERRING MD:  Jeanell Sparrow, CHIEF COMPLAINT:  weakness   History of Present Illness:  Mike Mack is a 86 y.o. M with PMH of Polio and CVA with residual R-sided weakness, HTN, CKD, Type 2 DM, SBO who was brought in after he slumped over in his wheelchair on the way to the an appointment today.  His wife is at the bedside and states that he has had throat pain with swallowing for approximately three weeks and subsequently very little po intake.  She denies fevers, chills, aspiration event, nausea or vomiting.   He had a recent fall with tib/fib fx on the R.   On presentation to the ED he was hypotensive and hypothermic, was given 30cc/kg IVF.  UA consistent with hematuria and UTI, CTH negative, CXR without acute process.  Labs significant for AKI with creatinine 5.4, K 5.4, WBC 9, lactic acid 1->2.1.  He was given vancomycin and cefepime and started on Levophed. PCCM consulted for admission  Pertinent  Medical History   has a past medical history of Anemia (05/13/2013), Arthritis, BPH (benign prostatic hyperplasia), Constipation (05/13/2013), Exertional shortness of breath, History of kidney stones, History of stomach ulcers, Hypertension, Neuropathy, Polio, Polio (Childhood), Small bowel obstruction ([place]), Stroke ([place]) (2014), and Type II diabetes mellitus ([place]).   Significant Hospital Events: Including procedures, antibiotic start and stop dates in addition to other pertinent events   9/12 Presented with generalized weakness, UTI and AKI, PCCM admit  Interim History / Subjective:  Pt awake and answering some questions though intermittently confused, on levophed 35mg, making some urine  Objective   Blood pressure (!) 101/49, pulse 61, temperature (!) 95.3 F (35.2 C), resp. rate 11, SpO2 100 %.       No intake or output data in the 24 hours ending 02/28/22 1340 There were  no vitals filed for this visit.  General:  eldlerly, chronically ill-appearing M, awake and in no distress HEENT: MM pink/dry, sclera anicteric  Neuro: awake, answering yes/no questions, following commands, non-focal, disoriented to situation CV: s1s2 rrr, no m/r/g PULM:  clear bilaterally on RA GI: soft, non-tender Extremities: warm/dry, no edema  Skin: no rashes or lesions, +skin tenting    Resolved Hospital Problem list     Assessment & Plan:    Septic shock secondary to UTI Wife denies any noted difficulty with urination recently, hx Pseudomonas -admit to ICU -continue Levophed to maintain MAP>65, may need CVC -continue Cefepime -follow blood and urine cultures -trend lactic acid   Acute on Chronic, stage 3b, Renal Failure Hyperkalemia -likely pre-renal and secondary to volume depletion -continue IVF -made approx 200cc urine thus far, monitor I/O's -renal [place]-no urgent need for dialysis today, follow electrolytes and renal indices, at high risk to progress to ESRD   Type 2 DM -SSI -hold home tresiba and actos  Acute Dysphagia -speech consult tomorrow -wife reports sore throat, possibly viral though has been ongoing for several weeks  Hypothyroidism -continue synthroid -last TSH >161yrgo, will repeat   Best Practice (right click and "Reselect all SmartList Selections" daily)   Diet/type: NPO DVT prophylaxis: prophylactic heparin  GI prophylaxis: N/A Lines: N/A Foley:  Yes, and it is still needed Code Status:  full code Last date of multidisciplinary goals of care discussion [Pt's wife denies any advanced directives, would like all possible interventions right now]  Labs   CBC: Recent  Labs  Lab 02/28/22 1022 02/28/22 1112  WBC 9.0  --   NEUTROABS 7.3  --   HGB 12.1* 14.6  HCT 36.6* 43.0  MCV 98.1  --   PLT 248  --     Basic Metabolic Panel: Recent Labs  Lab 02/28/22 1022 02/28/22 1112  NA 138 138  K 5.4* 5.1  CL 110 105  CO2 16*  --    GLUCOSE 225* 216*  BUN 171* >130*  CREATININE 5.44* 5.50*  CALCIUM 8.8*  --    GFR: CrCl cannot be calculated (Unknown ideal weight.). Recent Labs  Lab 02/28/22 1022 02/28/22 1031 02/28/22 1231  WBC 9.0  --   --   LATICACIDVEN  --  1.1 2.1*    Liver Function Tests: Recent Labs  Lab 02/28/22 1022  AST 11*  ALT 16  ALKPHOS 96  BILITOT 1.0  PROT 6.6  ALBUMIN 2.4*   No results for input(s): "LIPASE", "AMYLASE" in the last 168 hours. No results for input(s): "AMMONIA" in the last 168 hours.  ABG    Component Value Date/Time   PHART 7.360 02/21/2021 1753   PCO2ART 43.7 02/21/2021 1753   PO2ART 118 (H) 02/21/2021 1753   HCO3 17.1 (L) 02/28/2022 1054   TCO2 17 (L) 02/28/2022 1112   ACIDBASEDEF 9.3 (H) 02/28/2022 1054   O2SAT 26.8 02/28/2022 1054     Coagulation Profile: Recent Labs  Lab 02/28/22 1022  INR 1.4*    Cardiac Enzymes: No results for input(s): "CKTOTAL", "CKMB", "CKMBINDEX", "TROPONINI" in the last 168 hours.  HbA1C: Hgb A1c MFr Bld  Date/Time Value Ref Range Status  11/25/2020 03:37 PM 6.5 (H) 4.8 - 5.6 % Final    Comment:    (NOTE)         Prediabetes: 5.7 - 6.4         Diabetes: >6.4         Glycemic control for adults with diabetes: <7.0   03/19/2020 06:49 PM 8.4 (H) 4.8 - 5.6 % Final    Comment:    (NOTE) Pre diabetes:          5.7%-6.4%  Diabetes:              >6.4%  Glycemic control for   <7.0% adults with diabetes     CBG: No results for input(s): "GLUCAP" in the last 168 hours.  Review of Systems:   Unable to obtain  Past Medical History:  He,  has a past medical history of Anemia (05/13/2013), Arthritis, BPH (benign prostatic hyperplasia), Constipation (05/13/2013), Exertional shortness of breath, History of kidney stones, History of stomach ulcers, Hypertension, Neuropathy, Polio, Polio (Childhood), Small bowel obstruction ([place]), Stroke ([place]) (2014), and Type II diabetes mellitus ([place]).   Surgical History:   Past  Surgical History:  Procedure Laterality Date   BIOPSY  11/30/2020   Procedure: BIOPSY;  Surgeon: Ronnette Juniper, MD;  Location: [facility];  Service: Gastroenterology;;   BOWEL RESECTION     CATARACT EXTRACTION W/ INTRAOCULAR LENS  IMPLANT, BILATERAL Bilateral 2000's   CHOLECYSTECTOMY N/A 07/25/2013   Procedure: LAPAROSCOPIC CHOLECYSTECTOMY WITH INTRAOPERATIVE CHOLANGIOGRAM;  Surgeon: [name], MD;  Location: [facility];  Service: General;  Laterality: N/A;   COLON SURGERY     COLONOSCOPY     Hx: of   CYSTOSCOPY WITH BIOPSY N/A 12/01/2019   Procedure: CYSTOSCOPY WITH BLADDER BIOPSY AND FULGURATION;  Surgeon: Lucas Mallow, MD;  Location: [facility];  Service: Urology;  Laterality: N/A;  ESOPHAGOGASTRODUODENOSCOPY N/A 11/30/2020   Procedure: ESOPHAGOGASTRODUODENOSCOPY (EGD);  Surgeon: Ronnette Juniper, MD;  Location: [facility];  Service: Gastroenterology;  Laterality: N/A;   EXCISIONAL HEMORRHOIDECTOMY  2000's   EXTRACORPOREAL SHOCK WAVE LITHOTRIPSY Right 10/03/2018   Procedure: EXTRACORPOREAL SHOCK WAVE LITHOTRIPSY (ESWL);  Surgeon: Festus Aloe, MD;  Location: [facility];  Service: Urology;  Laterality: Right;   EYE SURGERY     INGUINAL HERNIA REPAIR Right 1970's   IR RADIOLOGIST EVAL & MGMT  04/06/2020   LAPAROSCOPIC CHOLECYSTECTOMY  07/25/2013   w/LOA (07/25/2013)     Social History:   reports that he has never smoked. He has never used smokeless tobacco. He reports that he does not drink alcohol and does not use drugs.   Family History:  His family history includes Diabetes in his brother and sister; Heart failure in his mother; Hypertension in his brother and sister.   Allergies Allergies  Allergen Reactions   Metformin And Related Other (See Comments)    Gi intolerance   Other     Other reaction(s): Other (See Comments) Renal insufficiency Other reaction(s): GI Upset (intolerance) Gi intolerance   Soliqua [Insulin Glargine-Lixisenatide] Other (See Comments)    Stomach cramps      Home Medications  Prior to Admission medications   Medication Sig Start Date End Date Taking? Authorizing Provider  acetaminophen (TYLENOL) 325 MG tablet Take 2 tablets (650 mg total) by mouth every 6 (six) hours as needed for mild pain (or Fever >/= 101). 11/27/20   Debbe Odea, MD  amLODipine (NORVASC) 5 MG tablet Take 5 mg by mouth daily. 12/17/20   [provider]  aspirin EC 81 MG tablet Take 1 tablet (81 mg total) by mouth daily. 12/16/20   Lavina Hamman, MD  atorvastatin (LIPITOR) 20 MG tablet Take 20 mg by mouth every evening.  11/03/13   [provider]  B Complex Vitamins (B COMPLEX PO) Take 1 tablet by mouth at bedtime.    [provider]  brimonidine (ALPHAGAN) 0.2 % ophthalmic solution Place 1 drop into both eyes 3 (three) times daily.    [provider]  cholecalciferol (VITAMIN D3) 10 MCG (400 UNIT) TABS tablet Take 400 Units by mouth daily.    [provider]  dorzolamide-timolol (COSOPT) 22.3-6.8 MG/ML ophthalmic solution Place 1 drop into both eyes 2 (two) times daily. 03/04/20   [provider]  feeding supplement, GLUCERNA SHAKE, (GLUCERNA SHAKE) LIQD Take 237 mLs by mouth 3 (three) times daily between meals. Patient taking differently: Take 237 mLs by mouth 2 (two) times daily between meals. 03/25/20   Antonieta Pert, MD  Finerenone (KERENDIA) 10 MG TABS Take 10 mg by mouth daily.    [provider]  gabapentin (NEURONTIN) 600 MG tablet Take 600 mg by mouth 2 (two) times daily as needed for headache. 01/21/21   [provider]  HYDROcodone-acetaminophen (NORCO/VICODIN) 5-325 MG tablet Take 1 tablet by mouth every 4 (four) hours as needed. 02/12/22   Jeanell Sparrow, DO  insulin degludec (TRESIBA FLEXTOUCH) 100 UNIT/ML FlexTouch Pen Inject 15 Units into the skin daily. 09/08/20   [provider]  latanoprost (XALATAN) 0.005 % ophthalmic solution Place 1 drop into both eyes at bedtime.  11/17/14   [provider]  levothyroxine (SYNTHROID) 88 MCG tablet Take 88 mcg by mouth at bedtime.  04/21/11   [provider]  memantine (NAMENDA) 10 MG tablet Take 10 mg by mouth 2 (two) times daily. 11/04/20   [provider]  metoprolol tartrate (LOPRESSOR) 25 MG tablet Take 1 tablet (25 mg total) by mouth 2 (two) times daily. 12/09/20   Lavina Hamman, MD  Multiple Vitamin (MULTIVITAMIN WITH MINERALS) TABS tablet Take 1 tablet by mouth daily. 09/06/13   [name], MD  olmesartan (BENICAR) 20 MG tablet Take 20 mg by mouth daily. 02/13/21   [provider]  ondansetron (ZOFRAN-ODT) 8 MG disintegrating tablet Take 1 tablet (8 mg total) by mouth every 8 (eight) hours as needed for nausea. Patient not taking: Reported on 02/12/2022 08/28/21   Varney Biles, MD  pantoprazole (PROTONIX) 40 MG tablet Take 1 tablet (40 mg total) by mouth 2 (two) times daily before a meal. 12/09/20   Lavina Hamman, MD  pioglitazone (ACTOS) 30 MG tablet Take 30 mg by mouth daily. 12/16/20   [provider]  polyethylene glycol (MIRALAX / GLYCOLAX) packet Take 17 g by mouth daily. Patient taking differently: Take 17 g by mouth daily as needed for mild constipation. 09/06/13   [name], MD  RHOPRESSA 0.02 % SOLN Place 1 drop into both eyes at bedtime. 03/05/20   [provider]  Tamsulosin HCl (FLOMAX) 0.4 MG CAPS Take 0.4 mg by mouth in the morning and at bedtime.    [provider]     Critical care time: 37 minutes    CRITICAL CARE Performed by: [name] [place] Ndiaye   Total critical care time: 37 minutes  Critical care time was exclusive of separately billable procedures and treating other patients.  Critical care was necessary to treat or prevent imminent or life-threatening deterioration.  Critical care was time spent personally by me on the following activities: development of treatment plan with patient and/or surrogate as well as nursing, discussions  with consultants, evaluation of patient's response to treatment, examination of patient, obtaining history from patient or surrogate, ordering and performing treatments and interventions, ordering and review of laboratory studies, ordering and review of radiographic studies, pulse oximetry and re-evaluation of patient's condition.  [name] Williard Keller, PA-C [facility] See Amion for pager If no response to pager , please call [phone] until 7pm After 7:00 pm call Elink  [phone]

## 2022-02-28 NOTE — Progress Notes (Signed)
A consult was received from an ED physician for vancomycin & cefepime per pharmacy dosing.  The patient's profile has been reviewed for ht/wt/allergies/indication/available labs.   Wt 02/02/22 = 74.8 kg  A one time order has been placed for vancomycin 1500 mg, cefepime 2 gm, Flagyl 500 mg.    Further antibiotics/pharmacy consults should be ordered by admitting physician if indicated.                       Thank you,  Eudelia Bunch, Pharm.D 02/28/2022 11:28 AM

## 2022-02-28 NOTE — Progress Notes (Signed)
Elink following code sepsis °

## 2022-02-28 NOTE — Progress Notes (Signed)
Pharmacy Antibiotic Note  Mike Mack is a 86 y.o. male admitted on 02/28/2022 with sepsis 2/2 UTI.  Pt has grown pseudomonas in urine culture in the past. Pharmacy has been consulted for cefepime dosing.  Today, 02/28/22 TBW = 74 kg on 02/02/22. Used this weight and SCr 4.67 to calculate CrCl = 12 mL/min Hx CKD, SCr was 2.13 on 01/24/22. AKI on CKD on admission  Plan: Cefepime 2 g IV q24h Monitor renal function for necessary dose adjustment Monitor culture data    Temp (24hrs), Avg:95.7 F (35.4 C), Min:95.2 F (35.1 C), Max:98.4 F (36.9 C)  Recent Labs  Lab 02/28/22 1022 02/28/22 1031 02/28/22 1112 02/28/22 1231 02/28/22 1408 02/28/22 1513  WBC 9.0  --   --   --  9.8  --   CREATININE 5.44*  --  5.50*  --  4.67*  --   LATICACIDVEN  --  1.1  --  2.1* 1.2 1.0    CrCl cannot be calculated (Unknown ideal weight.).    Allergies  Allergen Reactions   Metformin And Related Other (See Comments)    Gi intolerance   Soliqua [Insulin Glargine-Lixisenatide] Other (See Comments)    Stomach cramps    Antimicrobials this admission: cefepime 9/12 >>  vancomycin 9/12 x1 in ED Metronidazole 9/12 x1 in ED  Dose adjustments this admission:  Microbiology results: 9/12 BCx:  9/12 UCx:   9/12 MRSA PCR: Not detected  Lenis Noon, PharmD 02/28/2022 5:09 PM

## 2022-02-28 NOTE — ED Triage Notes (Addendum)
Pt arrived via EMS, from home, per spouse pt became more lethargic after waking up and getting in wheelchair. Became more lethargic while sitting, slumped over, lowered to ground by pt son. Per EMS pt baseline can sometimes state his name, other times will not. Pt able to state name in triage.   Hypotensive 94B systolic en route.   18G L AC 400cc NS given

## 2022-02-28 NOTE — Plan of Care (Signed)
Discussed with patient plan of care for the evening , pain management and blood pressure medications with some teach back displayed.   Problem: Education: Goal: Knowledge of General Education information will improve Description: Including pain rating scale, medication(s)/side effects and non-pharmacologic comfort measures Outcome: Progressing

## 2022-02-28 NOTE — ED Provider Notes (Signed)
[facility] DEPT Provider Note   CSN: [id number] Arrival date & time: 02/28/22  1006     History  Chief Complaint  Patient presents with   Fatigue   Altered Mental Status    Mike Mack is a 86 y.o. male.  HPI Level 5 caveat secondary to altered mental status 86 yo male ho right tibial plateau fracture, [facility] today via EMS with reports that his mental status is altered.  They report that his wife who had difficulty getting him out of bed this morning and had to call his son to come help.  They report that they were to get into a wheelchair with a head and then slumped over.  Upon EMS arrival patient was hypotensive.  Blood sugar was normal.  He received 500 cc of fluid prehospital.  Patient was able to mumble some words to them but was not clear.  They report no history of trauma.  Patient is not reported to be on any blood thinners.  No lateralized deficits were noted.  Family is on the way to the hospital but is not bedside on my initial evaluation    Home Medications Prior to Admission medications   Medication Sig Start Date End Date Taking? Authorizing Provider  acetaminophen (TYLENOL) 325 MG tablet Take 2 tablets (650 mg total) by mouth every 6 (six) hours as needed for mild pain (or Fever >/= 101). 11/27/20   Debbe Odea, MD  amLODipine (NORVASC) 5 MG tablet Take 5 mg by mouth daily. 12/17/20   [provider]  aspirin EC 81 MG tablet Take 1 tablet (81 mg total) by mouth daily. 12/16/20   Lavina Hamman, MD  atorvastatin (LIPITOR) 20 MG tablet Take 20 mg by mouth every evening.  11/03/13   [provider]  B Complex Vitamins (B COMPLEX PO) Take 1 tablet by mouth at bedtime.    [provider]  brimonidine (ALPHAGAN) 0.2 % ophthalmic solution Place 1 drop into both eyes 3 (three) times daily.    [provider]  cholecalciferol (VITAMIN D3) 10 MCG (400 UNIT) TABS tablet Take 400 Units by mouth daily.    [provider]  dorzolamide-timolol (COSOPT) 22.3-6.8 MG/ML ophthalmic solution Place 1 drop into both eyes 2 (two) times daily. 03/04/20   [provider]  feeding supplement, GLUCERNA SHAKE, (GLUCERNA SHAKE) LIQD Take 237 mLs by mouth 3 (three) times daily between meals. Patient taking differently: Take 237 mLs by mouth 2 (two) times daily between meals. 03/25/20   Antonieta Pert, MD  Finerenone (KERENDIA) 10 MG TABS Take 10 mg by mouth daily.    [provider]  gabapentin (NEURONTIN) 600 MG tablet Take 600 mg by mouth 2 (two) times daily as needed for headache. 01/21/21   [provider]  HYDROcodone-acetaminophen (NORCO/VICODIN) 5-325 MG tablet Take 1 tablet by mouth every 4 (four) hours as needed. 02/12/22   Jeanell Sparrow, DO  insulin degludec (TRESIBA FLEXTOUCH) 100 UNIT/ML FlexTouch Pen Inject 15 Units into the skin daily. 09/08/20   [provider]  latanoprost (XALATAN) 0.005 % ophthalmic solution Place 1 drop into both eyes at bedtime.  11/17/14   [provider]  levothyroxine (SYNTHROID) 88 MCG tablet Take 88 mcg by mouth at bedtime.  04/21/11   [provider]  memantine (NAMENDA) 10 MG tablet Take 10 mg by mouth 2 (two) times daily. 11/04/20   [provider]  metoprolol tartrate (LOPRESSOR) 25 MG tablet Take 1 tablet (25  mg total) by mouth 2 (two) times daily. 12/09/20   Lavina Hamman, MD  Multiple Vitamin (MULTIVITAMIN WITH MINERALS) TABS tablet Take 1 tablet by mouth daily. 09/06/13   [name], MD  olmesartan (BENICAR) 20 MG tablet Take 20 mg by mouth daily. 02/13/21   [provider]  ondansetron (ZOFRAN-ODT) 8 MG disintegrating tablet Take 1 tablet (8 mg total) by mouth every 8 (eight) hours as needed for nausea. Patient not taking: Reported on 02/12/2022 08/28/21   Varney Biles, MD  pantoprazole (PROTONIX) 40 MG tablet Take 1 tablet (40 mg total) by mouth 2 (two) times daily before a meal. 12/09/20   Lavina Hamman, MD  pioglitazone (ACTOS) 30 MG tablet Take 30 mg by mouth daily. 12/16/20   [provider]  polyethylene glycol (MIRALAX / GLYCOLAX) packet Take 17 g by mouth daily. Patient taking differently: Take 17 g by mouth daily as needed for mild constipation. 09/06/13   [name], MD  RHOPRESSA 0.02 % SOLN Place 1 drop into both eyes at bedtime. 03/05/20   [provider]  Tamsulosin HCl (FLOMAX) 0.4 MG CAPS Take 0.4 mg by mouth in the morning and at bedtime.    [provider]      Allergies    Metformin and related, Other, and Soliqua [insulin glargine-lixisenatide]    Review of Systems   Review of Systems  Physical Exam Updated Vital Signs BP (!) 92/46   Pulse (!) 58   Temp (!) 95.2 F (35.1 C)   Resp 12   SpO2 100%  Physical Exam Vitals reviewed.  Constitutional:      General: He is not in acute distress.    Appearance: He is ill-appearing.     Comments: Patient opens eyes and answer some questions  HENT:     Head: Normocephalic.     Right Ear: External ear normal.     Left Ear: External ear normal.     Nose: Nose normal.     Mouth/Throat:     Mouth: Mucous membranes are dry.  Eyes:     Pupils: Pupils are equal, round, and reactive to light.  Cardiovascular:     Rate and Rhythm: Normal rate.     Pulses: Normal pulses.     Heart sounds: Normal heart sounds.  Pulmonary:     Effort: Pulmonary effort is normal.     Comments: Breath sounds decreased throughout Abdominal:     General: Abdomen is flat.     Palpations: Abdomen is soft.  Musculoskeletal:     Cervical back: Normal range of motion.     Comments: Bilateral upper extremities without obvious signs of trauma Right lower extremity with chronic atrophy and some tenderness to palpation around the knee There is some breakdown of skin in the perirectal area No evidence of cellulitis or acute infection noted on skin exam  Skin:    General: Skin is warm and dry.     Capillary  Refill: Capillary refill takes less than 2 seconds.  Neurological:     General: No focal deficit present.     Comments: Patient is able to tell me his name He does not recall what brought him to the hospital He denies pain currently     ED Results / Procedures / Treatments   Labs (all labs ordered are listed, but only abnormal results are displayed) Labs Reviewed  COMPREHENSIVE METABOLIC PANEL - Abnormal; Notable for the following components:  Result Value   Potassium 5.4 (*)    CO2 16 (*)    Glucose, Bld 225 (*)    BUN 171 (*)    Creatinine, Ser 5.44 (*)    Calcium 8.8 (*)    Albumin 2.4 (*)    AST 11 (*)    GFR, Estimated 10 (*)    All other components within normal limits  CBC WITH DIFFERENTIAL/PLATELET - Abnormal; Notable for the following components:   RBC 3.73 (*)    Hemoglobin 12.1 (*)    HCT 36.6 (*)    Abs Immature Granulocytes 0.08 (*)    All other components within normal limits  PROTIME-INR - Abnormal; Notable for the following components:   Prothrombin Time 16.9 (*)    INR 1.4 (*)    All other components within normal limits  URINALYSIS, ROUTINE W REFLEX MICROSCOPIC - Abnormal; Notable for the following components:   Color, Urine AMBER (*)    APPearance CLOUDY (*)    Hgb urine dipstick LARGE (*)    Ketones, ur 5 (*)    Protein, ur 30 (*)    Leukocytes,Ua LARGE (*)    RBC / HPF >50 (*)    WBC, UA >50 (*)    Bacteria, UA RARE (*)    All other components within normal limits  BLOOD GAS, VENOUS - Abnormal; Notable for the following components:   pCO2, Ven 38 (*)    pO2, Ven <31 (*)    Bicarbonate 17.1 (*)    Acid-base deficit 9.3 (*)    All other components within normal limits  I-STAT CHEM 8, ED - Abnormal; Notable for the following components:   BUN >130 (*)    Creatinine, Ser 5.50 (*)    Glucose, Bld 216 (*)    TCO2 17 (*)    All other components within normal limits  CULTURE, BLOOD (ROUTINE X 2)  CULTURE, BLOOD (ROUTINE X 2)  URINE CULTURE   LACTIC ACID, PLASMA  APTT  LACTIC ACID, PLASMA    EKG None  Radiology DG Chest Port 1 View  Result Date: 02/28/2022 CLINICAL DATA:  Altered mental status EXAM: PORTABLE CHEST 1 VIEW COMPARISON:  12/12/2021 FINDINGS: Cardiac size is within normal limits. There are no signs of pulmonary edema or focal pulmonary consolidation. There is blunting of both lateral CP angles. There is no pneumothorax. Left hemidiaphragm is elevated. IMPRESSION: There are no signs of pulmonary edema or focal pulmonary consolidation. Small bilateral pleural effusions. Electronically Signed   By: Elmer Picker M.D.   On: 02/28/2022 12:00   DG Pelvis 1-2 Views  Result Date: 02/28/2022 CLINICAL DATA:  Lethargy, slumped over in wheelchair. EXAM: PELVIS - 1-2 VIEW COMPARISON:  Hip radiographs 02/12/2022 FINDINGS: There is no definite evidence of acute fracture, though positioning is suboptimal. Femoroacetabular alignment appears maintained. Soft tissues are unremarkable. A wire projects over the bladder. IMPRESSION: No definite acute fracture or dislocation, though positioning is suboptimal. If there is persistent clinical concern, repeat radiographs or cross-sectional imaging may be obtained. Electronically Signed   By: Valetta Mole M.D.   On: 02/28/2022 11:58   DG FEMUR, MIN 2 VIEWS RIGHT  Result Date: 02/28/2022 CLINICAL DATA:  Lethargy, fall EXAM: RIGHT FEMUR 2 VIEWS COMPARISON:  None Available. FINDINGS: No displaced fracture is seen in right femur. Osteopenia is seen in bony structures. Possible small effusion is present in the right knee. There are scattered arterial calcifications in soft tissues. IMPRESSION: No fracture or dislocation is seen in right femur.  Possible small effusion is present in right knee. Electronically Signed   By: Elmer Picker M.D.   On: 02/28/2022 11:58   CT Head Wo Contrast  Result Date: 02/28/2022 CLINICAL DATA:  Mental status change EXAM: CT HEAD WITHOUT CONTRAST TECHNIQUE:  Contiguous axial images were obtained from the base of the skull through the vertex without intravenous contrast. RADIATION DOSE REDUCTION: This exam was performed according to the departmental dose-optimization program which includes automated exposure control, adjustment of the mA and/or kV according to patient size and/or use of iterative reconstruction technique. COMPARISON:  CT head 02/21/2021 FINDINGS: Brain: Diffuse ventricular enlargement, unchanged. Biventricular diameter 57 mm, unchanged. Third fourth and lateral ventricles all dilated. Patchy periventricular white matter hypodensity bilaterally unchanged. No acute infarct, hemorrhage, mass. 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 Vascular: Negative for hyperdense vessel Skull: Negative Sinuses/Orbits: Paranasal sinuses clear. Right mastoid effusion. Left mastoid sinus clear. Bilateral cataract extraction Other: None IMPRESSION: Communicating hydrocephalus, unchanged. Correlate with symptoms of normal pressure hydrocephalus. Chronic ischemic changes in the white matter and cerebellum bilaterally. No acute infarct or hemorrhage. Electronically Signed   By: Franchot Gallo M.D.   On: 02/28/2022 11:25    Procedures .Critical Care  Performed by: Pattricia Boss, MD Authorized by: Pattricia Boss, MD   Critical care provider statement:    Critical care time (minutes):  65   Critical care was necessary to treat or prevent imminent or life-threatening deterioration of the following conditions:  CNS failure or compromise, circulatory failure and sepsis   Critical care was time spent personally by me on the following activities:  Development of treatment plan with patient or surrogate, discussions with consultants, evaluation of patient's response to treatment, examination of patient, ordering and review of laboratory studies, ordering and review of radiographic studies, ordering and performing treatments and interventions, pulse oximetry,  re-evaluation of patient's condition and review of old charts     Medications Ordered in ED Medications  lactated ringers infusion ( Intravenous New Bag/Given 02/28/22 1323)  lactated ringers bolus 1,000 mL (0 mLs Intravenous Stopped 02/28/22 1308)    And  lactated ringers bolus 1,000 mL (0 mLs Intravenous Hold 02/28/22 1159)    And  lactated ringers bolus 250 mL (250 mLs Intravenous New Bag/Given 02/28/22 1307)  vancomycin (VANCOREADY) IVPB 1500 mg/300 mL (1,500 mg Intravenous New Bag/Given 02/28/22 1214)  norepinephrine (LEVOPHED) '4mg'$  in 29m (0.016 mg/mL) premix infusion (5 mcg/min Intravenous Rate/Dose Change 02/28/22 1324)  lactated ringers bolus 1,000 mL (0 mLs Intravenous Stopped 02/28/22 1300)  ceFEPIme (MAXIPIME) 2 g in sodium chloride 0.9 % 100 mL IVPB (0 g Intravenous Stopped 02/28/22 1220)  metroNIDAZOLE (FLAGYL) IVPB 500 mg (0 mg Intravenous Stopped 02/28/22 1258)    ED Course/ Medical Decision Making/ A&P Clinical Course as of 02/28/22 1326  Tue Feb 28, 2022  1214 Lactic acid, plasma [DR]  1255 Chest x-Nakiyah Beverley reviewed interpreted no evidence of acute consolidation or hemopneumothorax Some small bilateral pleural effusions [DR]  1256 CT of head reviewed interpreted with ongoing communicating hydrocephalus unchanged from prior acute infarct or hemorrhage noted [DR]  1256 Right femur without fracture or dislocation possible small effusion in the right knee [DR]  1323 Right knee x-Hanif Radin reviewed interpreted and similar appearance of impacted medial plateau fracture that significant change is noted associated moderate-sized joint effusion noted no evidence of acute abnormalities [DR]  1324 Pelvis x-Mico Spark reviewed and interpreted with no definite acute fracture or dislocation [DR]    Clinical Course User Index [DR] RPattricia Boss  MD                           Medical Decision Making Patient presents with hypotension and altered mental status Differential diagnosis is broad and includes  but is not limited to Cardiogenic shock Hypoglycemia Toxic toxic etiologies including narcotics, medications with CNS activity and chronic overdosing Sepsis/infection Volume depletion Patient evaluated here with labs, rectal temperature, temp Foley placed Wife at bedside.  She states patient is normally awake and alert and oriented.  This morning he was altered compared to usual. Patient with hypothermia, hypotension, UTI on work-up She states patient has not been eating and drinking well.  Patient's creatinine elevated to 5 and BUN greater than 130 Patient treated here in the ED with 30 cc/kg of fluid and has had some increase in hypotension with latest blood pressure 83/40 He has received broad-spectrum antibiotics ongoing hypotension in setting of infection will start Levophed Critical care consulted Discussed with Dr.Olalere who will see patient in conuslt  1- altered mental status- likely secondary to cephalopathy from infection and volume depletion with dehydration.  CT obtained no evidence of acute bleeding lateralized deficits are noted 2 acute renal failure patient with elevated creatinine to 5 and BUN greater than 130.  From patient's history with his wife he has not been taking p.o. well.  This is likely secondary to volume depletion.  No evidence of hyperkalemia and patient does not appear to be volume overloaded 3 sepsis/UTI patient with hypothermia and urinalysis consistent with UTI.  Patient had received broad-spectrum antibiotics prior to urinalysis completion.  Will culture urine 4 CODE STATUS discussed with wife and at this time she wishes to proceed with full treatment   Amount and/or Complexity of Data Reviewed Labs: ordered. Decision-making details documented in ED Course. Radiology: ordered and independent interpretation performed. Decision-making details documented in ED Course. ECG/medicine tests: ordered and independent interpretation performed. Decision-making  details documented in ED Course.  Risk Prescription drug management.   Treatments include volume resuscitation, antibiotics, and vasopressors Monitoring of patient includes temp Foley, blood pressure, continuous cardiac monitoring and pulse oximetry        Final Clinical Impression(s) / ED Diagnoses Final diagnoses:  Acute renal failure, unspecified acute renal failure type ([place])  Hypotension, unspecified hypotension type  Urinary tract infection with hematuria, site unspecified    Rx / DC Orders ED Discharge Orders     None         Pattricia Boss, MD 02/28/22 1327

## 2022-02-28 NOTE — Progress Notes (Signed)
Notified provider and bedside nurse of need to order and draw repeat lactic acid #3.  

## 2022-03-01 DIAGNOSIS — A419 Sepsis, unspecified organism: Secondary | ICD-10-CM | POA: Diagnosis not present

## 2022-03-01 DIAGNOSIS — R6521 Severe sepsis with septic shock: Secondary | ICD-10-CM | POA: Diagnosis not present

## 2022-03-01 LAB — CBC
HCT: 32.4 % — ABNORMAL LOW (ref 39.0–52.0)
Hemoglobin: 10.9 g/dL — ABNORMAL LOW (ref 13.0–17.0)
MCH: 31.8 pg (ref 26.0–34.0)
MCHC: 33.6 g/dL (ref 30.0–36.0)
MCV: 94.5 fL (ref 80.0–100.0)
Platelets: 226 10*3/uL (ref 150–400)
RBC: 3.43 MIL/uL — ABNORMAL LOW (ref 4.22–5.81)
RDW: 13.5 % (ref 11.5–15.5)
WBC: 9.5 10*3/uL (ref 4.0–10.5)
nRBC: 0 % (ref 0.0–0.2)

## 2022-03-01 LAB — BASIC METABOLIC PANEL
Anion gap: 10 (ref 5–15)
BUN: 130 mg/dL — ABNORMAL HIGH (ref 8–23)
CO2: 17 mmol/L — ABNORMAL LOW (ref 22–32)
Calcium: 8.1 mg/dL — ABNORMAL LOW (ref 8.9–10.3)
Chloride: 108 mmol/L (ref 98–111)
Creatinine, Ser: 3.34 mg/dL — ABNORMAL HIGH (ref 0.61–1.24)
GFR, Estimated: 18 mL/min — ABNORMAL LOW (ref >60)
Glucose, Bld: 254 mg/dL — ABNORMAL HIGH (ref 70–99)
Potassium: 4.3 mmol/L (ref 3.5–5.1)
Sodium: 135 mmol/L (ref 135–145)

## 2022-03-01 LAB — URINE CULTURE: Culture: NO GROWTH

## 2022-03-01 LAB — GLUCOSE, CAPILLARY
Glucose-Capillary: 172 mg/dL — ABNORMAL HIGH (ref 70–99)
Glucose-Capillary: 174 mg/dL — ABNORMAL HIGH (ref 70–99)
Glucose-Capillary: 183 mg/dL — ABNORMAL HIGH (ref 70–99)
Glucose-Capillary: 188 mg/dL — ABNORMAL HIGH (ref 70–99)
Glucose-Capillary: 202 mg/dL — ABNORMAL HIGH (ref 70–99)
Glucose-Capillary: 205 mg/dL — ABNORMAL HIGH (ref 70–99)
Glucose-Capillary: 224 mg/dL — ABNORMAL HIGH (ref 70–99)

## 2022-03-01 LAB — MAGNESIUM: Magnesium: 2.3 mg/dL (ref 1.7–2.4)

## 2022-03-01 MED ORDER — LACTATED RINGERS IV SOLN: INTRAVENOUS | Status: DC

## 2022-03-01 MED ORDER — CHLORHEXIDINE GLUCONATE CLOTH 2 % EX PADS
6.0000 | MEDICATED_PAD | Freq: Every day | CUTANEOUS | Status: DC
  Administered 2022-03-01 – 2022-03-05 (×5): 6 via TOPICAL

## 2022-03-01 MED ORDER — LACTATED RINGERS IV BOLUS
500.0000 mL | Freq: Once | INTRAVENOUS | Status: AC
  Administered 2022-03-01: 500 mL via INTRAVENOUS

## 2022-03-01 NOTE — Progress Notes (Signed)
NAME:  Mike Mack, MRN:  [id number], DOB:  [birth year], LOS: 1 ADMISSION DATE:  02/28/2022, CONSULTATION DATE: 02/28/2022 REFERRING MD: Dr. Jeanell Sparrow, CHIEF COMPLAINT: Weakness  History of Present Illness:  Mike Mack is a 86 y.o. M with PMH of Polio and CVA with residual R-sided weakness, HTN, CKD, Type 2 DM, SBO who was brought in after he slumped over in his wheelchair on the way to the an appointment today.  His wife is at the bedside and states that he has had throat pain with swallowing for approximately three weeks and subsequently very little po intake.  She denies fevers, chills, aspiration event, nausea or vomiting.   He had a recent fall with tib/fib fx on the R.    On presentation to the ED he was hypotensive and hypothermic, was given 30cc/kg IVF.  UA consistent with hematuria and UTI, CTH negative, CXR without acute process.  Labs significant for AKI with creatinine 5.4, K 5.4, WBC 9, lactic acid 1->2.1.  He was given vancomycin and cefepime and started on Levophed. PCCM consulted for admission  Pertinent  Medical History   has a past medical history of Anemia (05/13/2013), Arthritis, BPH (benign prostatic hyperplasia), Constipation (05/13/2013), Exertional shortness of breath, History of kidney stones, History of stomach ulcers, Hypertension, Neuropathy, Polio, Polio (Childhood), Small bowel obstruction ([place]), Stroke ([place]) (2014), and Type II diabetes mellitus ([place]).  Significant Hospital Events: Including procedures, antibiotic start and stop dates in addition to other pertinent events   9/12-presented with generalized weakness, UTI and AKI, PCCM admitted  Interim History / Subjective:  Awake and interactive this morning, feels a little bit better Decreasing doses of pressors  Objective   Blood pressure (!) 116/40, pulse 78, temperature 97.9 F (36.6 C), resp. rate 18, height '5\' 5"'$  (1.651 m), weight 76 kg, SpO2 100 %. CVP:  [5 mmHg-15 mmHg] 5 mmHg      Intake/Output  Summary (Last 24 hours) at 03/01/2022 0943 Last data filed at 03/01/2022 0900 Gross per 24 hour  Intake 6338.06 ml  Output 750 ml  Net 5588.06 ml   Filed Weights   02/28/22 2000 03/01/22 0314  Weight: 76 kg 76 kg    Examination: General: Elderly gentleman, chronically ill-appearing HENT: Dry oral mucosa Lungs: Clear breath sounds Cardiovascular: S1-S2 appreciated Abdomen: Bowel sounds appreciated Extremities: No clubbing, no edema Neuro: Alert and oriented x3, moving all extremities GU: Fair output  BUN/creatinine improving  Resolved Hospital Problem list     Assessment & Plan:  Septic shock secondary to UTI -Continue pressors -Continue cefepime -Follow blood cultures and urine cultures -Previously had had Pseudomonas  Acute on chronic stage IIIb renal failure -Hypokalemia -Continue fluid resuscitation -Avoid nephrotoxics -Renal adjust medications  Type 2 diabetes -SSI -Hold home diabetic medications  Sore throat -Speech eval  Hypothyroidism -Continue Synthroid  Best Practice (right click and "Reselect all SmartList Selections" daily)   Diet/type: NPO DVT prophylaxis: prophylactic heparin  GI prophylaxis: N/A Lines: Central line Foley:  Yes, and it is still needed Code Status:  full code Last date of multidisciplinary goals of care discussion [pending]  Labs   CBC: Recent Labs  Lab 02/28/22 1022 02/28/22 1112 02/28/22 1408 03/01/22 0415  WBC 9.0  --  9.8 9.5  NEUTROABS 7.3  --   --   --   HGB 12.1* 14.6 10.2* 10.9*  HCT 36.6* 43.0 31.3* 32.4*  MCV 98.1  --  97.2 94.5  PLT 248  --  204 226    Basic  Metabolic Panel: Recent Labs  Lab 02/28/22 1022 02/28/22 1112 02/28/22 1408 02/28/22 1513 02/28/22 1720 03/01/22 0415  NA 138 138  --   --  136 135  K 5.4* 5.1  --   --  4.7 4.3  CL 110 105  --   --  109 108  CO2 16*  --   --   --  16* 17*  GLUCOSE 225* 216*  --   --  232* 254*  BUN 171* >130*  --   --  154* 130*  CREATININE 5.44*  5.50* 4.67*  --  4.16* 3.34*  CALCIUM 8.8*  --   --   --  8.0* 8.1*  MG  --   --   --  2.5*  --  2.3  PHOS  --   --   --  4.5  --   --    GFR: Estimated Creatinine Clearance: 16 mL/min (A) (by C-G formula based on SCr of 3.34 mg/dL (H)). Recent Labs  Lab 02/28/22 1022 02/28/22 1031 02/28/22 1231 02/28/22 1408 02/28/22 1513 02/28/22 1720 03/01/22 0415  WBC 9.0  --   --  9.8  --   --  9.5  LATICACIDVEN  --    < > 2.1* 1.2 1.0 0.7  --    < > = values in this interval not displayed.    Liver Function Tests: Recent Labs  Lab 02/28/22 1022 02/28/22 1720  AST 11* 12*  ALT 16 14  ALKPHOS 96 79  BILITOT 1.0 1.0  PROT 6.6 5.3*  ALBUMIN 2.4* 2.0*   No results for input(s): "LIPASE", "AMYLASE" in the last 168 hours. No results for input(s): "AMMONIA" in the last 168 hours.  ABG    Component Value Date/Time   PHART 7.360 02/21/2021 1753   PCO2ART 43.7 02/21/2021 1753   PO2ART 118 (H) 02/21/2021 1753   HCO3 17.1 (L) 02/28/2022 1054   TCO2 17 (L) 02/28/2022 1112   ACIDBASEDEF 9.3 (H) 02/28/2022 1054   O2SAT 26.8 02/28/2022 1054     Coagulation Profile: Recent Labs  Lab 02/28/22 1022  INR 1.4*    Cardiac Enzymes: No results for input(s): "CKTOTAL", "CKMB", "CKMBINDEX", "TROPONINI" in the last 168 hours.  HbA1C: Hgb A1c MFr Bld  Date/Time Value Ref Range Status  02/28/2022 02:08 PM 7.5 (H) 4.8 - 5.6 % Final    Comment:    (NOTE) Pre diabetes:          5.7%-6.4%  Diabetes:              >6.4%  Glycemic control for   <7.0% adults with diabetes   11/25/2020 03:37 PM 6.5 (H) 4.8 - 5.6 % Final    Comment:    (NOTE)         Prediabetes: 5.7 - 6.4         Diabetes: >6.4         Glycemic control for adults with diabetes: <7.0     CBG: Recent Labs  Lab 02/28/22 1644 02/28/22 1953 02/28/22 2343 03/01/22 0422 03/01/22 0743  GLUCAP 207* 223* 205* 224* 202*    Review of Systems:   Overall little bit better  Past Medical History:  He,  has a past medical  history of Anemia (05/13/2013), Arthritis, BPH (benign prostatic hyperplasia), Constipation (05/13/2013), Exertional shortness of breath, History of kidney stones, History of stomach ulcers, Hypertension, Neuropathy, Polio, Polio (Childhood), Small bowel obstruction ([place]), Stroke ([place]) (2014), and Type II diabetes mellitus ([place]).   Surgical  History:   Past Surgical History:  Procedure Laterality Date   BIOPSY  11/30/2020   Procedure: BIOPSY;  Surgeon: Ronnette Juniper, MD;  Location: [facility];  Service: Gastroenterology;;   BOWEL RESECTION     CATARACT EXTRACTION W/ INTRAOCULAR LENS  IMPLANT, BILATERAL Bilateral 2000's   CHOLECYSTECTOMY N/A 07/25/2013   Procedure: LAPAROSCOPIC CHOLECYSTECTOMY WITH INTRAOPERATIVE CHOLANGIOGRAM;  Surgeon: [name], MD;  Location: [facility];  Service: General;  Laterality: N/A;   COLON SURGERY     COLONOSCOPY     Hx: of   CYSTOSCOPY WITH BIOPSY N/A 12/01/2019   Procedure: CYSTOSCOPY WITH BLADDER BIOPSY AND FULGURATION;  Surgeon: Lucas Mallow, MD;  [facility];  Service: Urology;  Laterality: N/A;   ESOPHAGOGASTRODUODENOSCOPY N/A 11/30/2020   Procedure: ESOPHAGOGASTRODUODENOSCOPY (EGD);  Surgeon: Ronnette Juniper, MD;  Location: [facility];  Service: Gastroenterology;  Laterality: N/A;   EXCISIONAL HEMORRHOIDECTOMY  2000's   EXTRACORPOREAL SHOCK WAVE LITHOTRIPSY Right 10/03/2018   Procedure: EXTRACORPOREAL SHOCK WAVE LITHOTRIPSY (ESWL);  Surgeon: Festus Aloe, MD;  Location[facility];  Service: Urology;  Laterality: Right;   EYE SURGERY     INGUINAL HERNIA REPAIR Right 1970's   IR RADIOLOGIST EVAL & MGMT  04/06/2020   LAPAROSCOPIC CHOLECYSTECTOMY  07/25/2013   w/LOA (07/25/2013)     Social History:   reports that he has never smoked. He has never used smokeless tobacco. He reports that he does not drink alcohol and does not use drugs.   Family History:  His family history includes Diabetes in his brother and sister; Heart failure in his mother;  Hypertension in his brother and sister.   Allergies Allergies  Allergen Reactions   Metformin And Related Other (See Comments)    Gi intolerance   Soliqua [Insulin Glargine-Lixisenatide] Other (See Comments)    Stomach cramps    The patient is critically ill with multiple organ systems failure and requires high complexity decision making for assessment and support, frequent evaluation and titration of therapies, application of advanced monitoring technologies and extensive interpretation of multiple databases. Critical Care Time devoted to patient care services described in this note independent of APP/resident time (if applicable)  is 32 minutes.   Sherrilyn Rist MD [facility] Personal pager: See Amion If unanswered, please page CCM On-call: [phone]

## 2022-03-01 NOTE — Evaluation (Signed)
Clinical/Bedside Swallow Evaluation Patient Details  Name: Mike Mack MRN: [id number] Date of Birth: 05-14-1936  Today's Date: 03/01/2022 Time: SLP Start Time (ACUTE ONLY): 1300 SLP Stop Time (ACUTE ONLY): 1320 SLP Time Calculation (min) (ACUTE ONLY): 20 min  Past Medical History:  Past Medical History:  Diagnosis Date   Anemia 05/13/2013   Arthritis    "right leg" (07/25/2013)   BPH (benign prostatic hyperplasia)    Constipation 05/13/2013   Exertional shortness of breath    "sometimes" (07/25/2013)   History of kidney stones    History of stomach ulcers    Hypertension    Neuropathy    Polio    Polio Childhood   Small bowel obstruction ([place])    Stroke ([place]) 2014   residual:  "left hand kind of numb" (07/25/2013)   Type II diabetes mellitus ([place])    Past Surgical History:  Past Surgical History:  Procedure Laterality Date   BIOPSY  11/30/2020   Procedure: BIOPSY;  Surgeon: Ronnette Juniper, MD;  Location: [facility];  Service: Gastroenterology;;   BOWEL RESECTION     CATARACT EXTRACTION W/ INTRAOCULAR LENS  IMPLANT, BILATERAL Bilateral 2000's   CHOLECYSTECTOMY N/A 07/25/2013   Procedure: LAPAROSCOPIC CHOLECYSTECTOMY WITH INTRAOPERATIVE CHOLANGIOGRAM;  Surgeon: [name], MD;  Location: [facility];  Service: General;  Laterality: N/A;   COLON SURGERY     COLONOSCOPY     Hx: of   CYSTOSCOPY WITH BIOPSY N/A 12/01/2019   Procedure: CYSTOSCOPY WITH BLADDER BIOPSY AND FULGURATION;  Surgeon: Lucas Mallow, MD;  [facility];  Service: Urology;  Laterality: N/A;   ESOPHAGOGASTRODUODENOSCOPY N/A 11/30/2020   Procedure: ESOPHAGOGASTRODUODENOSCOPY (EGD);  Surgeon: Ronnette Juniper, MD;  Location: [facility];  Service: Gastroenterology;  Laterality: N/A;   EXCISIONAL HEMORRHOIDECTOMY  2000's   EXTRACORPOREAL SHOCK WAVE LITHOTRIPSY Right 10/03/2018   Procedure: EXTRACORPOREAL SHOCK WAVE LITHOTRIPSY (ESWL);  Surgeon: Festus Aloe, MD;  [facility];  Service: Urology;   Laterality: Right;   EYE SURGERY     INGUINAL HERNIA REPAIR Right 1970's   IR RADIOLOGIST EVAL & MGMT  04/06/2020   LAPAROSCOPIC CHOLECYSTECTOMY  07/25/2013   w/LOA (07/25/2013)   HPI:  Mike Mack is a 86 y.o. M with PMH of Polio and CVA with residual R-sided weakness, HTN, CKD, Type 2 DM, SBO who was brought in after he slumped over in his wheelchair on the way to the an appointment today.  His wife is at the bedside and states that he has had throat pain with swallowing for approximately three weeks and subsequently very little po intake.  She denies fevers, chills, aspiration event, nausea or vomiting.   He had a recent fall with tib/fib fx on the R. Found to have UTI and AKI. HIstory of stomach ulcers.    Assessment / Plan / Recommendation  Clinical Impression  Pt reportedly with throat pain prior to admission. SLP arrived to address that issue, but RN reported pt has been lethargic. Pt found with eyes closed, chewing something. Opened mouth on command and found to have a large quantity of pocketed meat in right cheek. Wife suprised at bedside, said she fed it to him a while ago and thought he swallowed it. SLP repositioned pt, removed most of bolus, offered a sip of tea from cup edge, but pt did not initiate swallow. Suction used to remove liquid and remainer of meat. Recommend NPO except for meds crushed in puree as needed. If pt alert OK to offer sips of water  if tolerated. Will f/u tomorrow. SLP Visit Diagnosis: Dysphagia, oral phase (R13.11)    Aspiration Risk  Severe aspiration risk    Diet Recommendation NPO;NPO except meds   Medication Administration: Crushed with puree Supervision: Full supervision/cueing for compensatory strategies Compensations: Lingual sweep for clearance of pocketing Postural Changes: Seated upright at 90 degrees;Remain upright for at least 30 minutes after po intake    Other  Recommendations Oral Care Recommendations: Oral care BID;Oral care before and  after PO    Recommendations for follow up therapy are one component of a multi-disciplinary discharge planning process, led by the attending physician.  Recommendations may be updated based on patient status, additional functional criteria and insurance authorization.  Follow up Recommendations Skilled nursing-short term rehab (<3 hours/day)      Assistance Recommended at Discharge    Functional Status Assessment    Frequency and Duration            Prognosis        Swallow Study   General HPI: Mike Mack is a 86 y.o. M with PMH of Polio and CVA with residual R-sided weakness, HTN, CKD, Type 2 DM, SBO who was brought in after he slumped over in his wheelchair on the way to the an appointment today.  His wife is at the bedside and states that he has had throat pain with swallowing for approximately three weeks and subsequently very little po intake.  She denies fevers, chills, aspiration event, nausea or vomiting.   He had a recent fall with tib/fib fx on the R. Found to have UTI and AKI. HIstory of stomach ulcers. Type of Study: Bedside Swallow Evaluation Previous Swallow Assessment: none Diet Prior to this Study: Regular;Thin liquids Temperature Spikes Noted: No Respiratory Status: Nasal cannula History of Recent Intubation: No Behavior/Cognition: Lethargic/Drowsy Oral Cavity Assessment: Other (comment) (full of spaghetti) Oral Care Completed by SLP: Yes Oral Cavity - Dentition: Adequate natural dentition Self-Feeding Abilities: Total assist Patient Positioning: Upright in bed Baseline Vocal Quality: Normal Volitional Cough: Cognitively unable to elicit Volitional Swallow: Unable to elicit    Oral/Motor/Sensory Function Overall Oral Motor/Sensory Function: Other (comment) (opens mouth on command)   Ice Chips     Thin Liquid Thin Liquid: Impaired Presentation: Cup Oral Phase Impairments: Poor awareness of bolus Oral Phase Functional Implications: Oral holding    Nectar  Thick Nectar Thick Liquid: Not tested   Honey Thick Honey Thick Liquid: Not tested   Puree Puree: Not tested   Solid     Solid: Not tested (Pt orally holding large quantity of spaghetti that wife and RN fed him)      Marche Hottenstein, Katherene Ponto 03/01/2022,2:08 PM

## 2022-03-01 NOTE — Inpatient Diabetes Management (Signed)
Inpatient Diabetes Program Recommendations  AACE/ADA: New Consensus Statement on Inpatient Glycemic Control (2015)  Target Ranges:  Prepandial:   less than 140 mg/dL      Peak postprandial:   less than 180 mg/dL (1-2 hours)      Critically ill patients:  140 - 180 mg/dL   Lab Results  Component Value Date   GLUCAP 202 (H) 03/01/2022   HGBA1C 7.5 (H) 02/28/2022    Review of Glycemic Control  Latest Reference Range & Units 02/28/22 16:44 02/28/22 19:53 02/28/22 23:43 03/01/22 04:22 03/01/22 07:43  Glucose-Capillary 70 - 99 mg/dL 207 (H) 223 (H) 205 (H) 224 (H) 202 (H)  (H): Data is abnormally high  Diabetes history:  DM2  Outpatient Diabetes medications:  Tresiba 15 units QD Actos 30 mg QD  Current orders for Inpatient glycemic control:  Novolog 0-9 Q4H  Inpatient Diabetes Program Recommendations:    Semglee 10 units QD (50% of home dose)  Will continue to follow while inpatient.  Thank you, Reche Dixon, MSN, [facility] Coordinator Inpatient Diabetes Program [phone] (team pager from 8a-5p)

## 2022-03-02 DIAGNOSIS — R6521 Severe sepsis with septic shock: Secondary | ICD-10-CM | POA: Diagnosis not present

## 2022-03-02 DIAGNOSIS — A419 Sepsis, unspecified organism: Secondary | ICD-10-CM | POA: Diagnosis not present

## 2022-03-02 LAB — CBC
HCT: 24.5 % — ABNORMAL LOW (ref 39.0–52.0)
Hemoglobin: 8.2 g/dL — ABNORMAL LOW (ref 13.0–17.0)
MCH: 32.3 pg (ref 26.0–34.0)
MCHC: 33.5 g/dL (ref 30.0–36.0)
MCV: 96.5 fL (ref 80.0–100.0)
Platelets: 183 10*3/uL (ref 150–400)
RBC: 2.54 MIL/uL — ABNORMAL LOW (ref 4.22–5.81)
RDW: 13.9 % (ref 11.5–15.5)
WBC: 9.5 10*3/uL (ref 4.0–10.5)
nRBC: 0 % (ref 0.0–0.2)

## 2022-03-02 LAB — GLUCOSE, CAPILLARY
Glucose-Capillary: 192 mg/dL — ABNORMAL HIGH (ref 70–99)
Glucose-Capillary: 192 mg/dL — ABNORMAL HIGH (ref 70–99)
Glucose-Capillary: 178 mg/dL — ABNORMAL HIGH (ref 70–99)
Glucose-Capillary: 201 mg/dL — ABNORMAL HIGH (ref 70–99)
Glucose-Capillary: 105 mg/dL — ABNORMAL HIGH (ref 70–99)
Glucose-Capillary: 171 mg/dL — ABNORMAL HIGH (ref 70–99)
Glucose-Capillary: 162 mg/dL — ABNORMAL HIGH (ref 70–99)

## 2022-03-02 LAB — BASIC METABOLIC PANEL
Anion gap: 3 — ABNORMAL LOW (ref 5–15)
BUN: 110 mg/dL — ABNORMAL HIGH (ref 8–23)
CO2: 19 mmol/L — ABNORMAL LOW (ref 22–32)
Calcium: 7.8 mg/dL — ABNORMAL LOW (ref 8.9–10.3)
Chloride: 119 mmol/L — ABNORMAL HIGH (ref 98–111)
Creatinine, Ser: 1.82 mg/dL — ABNORMAL HIGH (ref 0.61–1.24)
GFR, Estimated: 36 mL/min — ABNORMAL LOW (ref >60)
Glucose, Bld: 221 mg/dL — ABNORMAL HIGH (ref 70–99)
Potassium: 4.1 mmol/L (ref 3.5–5.1)
Sodium: 141 mmol/L (ref 135–145)

## 2022-03-02 LAB — PREPARE RBC (CROSSMATCH)

## 2022-03-02 LAB — HEMOGLOBIN AND HEMATOCRIT, BLOOD
HCT: 21.5 % — ABNORMAL LOW (ref 39.0–52.0)
Hemoglobin: 7.1 g/dL — ABNORMAL LOW (ref 13.0–17.0)

## 2022-03-02 MED ORDER — LACTATED RINGERS IV BOLUS
1000.0000 mL | Freq: Once | INTRAVENOUS | Status: AC
  Administered 2022-03-02: 1000 mL via INTRAVENOUS

## 2022-03-02 MED ORDER — HYDROMORPHONE HCL 1 MG/ML IJ SOLN
0.5000 mg | INTRAMUSCULAR | Status: DC | PRN
  Administered 2022-03-02 (×2): 0.5 mg via INTRAVENOUS
  Filled 2022-03-02 (×2): qty 1

## 2022-03-02 MED ORDER — SODIUM CHLORIDE 0.9% IV SOLUTION: Freq: Once | INTRAVENOUS | Status: AC

## 2022-03-02 MED ORDER — FENTANYL CITRATE PF 50 MCG/ML IJ SOSY
25.0000 ug | PREFILLED_SYRINGE | INTRAMUSCULAR | Status: DC | PRN
  Administered 2022-03-03: 25 ug via INTRAVENOUS
  Filled 2022-03-02: qty 1

## 2022-03-02 MED ORDER — PANTOPRAZOLE SODIUM 40 MG IV SOLR
40.0000 mg | Freq: Two times a day (BID) | INTRAVENOUS | Status: DC
  Administered 2022-03-02 – 2022-03-04 (×5): 40 mg via INTRAVENOUS
  Filled 2022-03-02 (×5): qty 10

## 2022-03-02 NOTE — Progress Notes (Signed)
Pt continuously moaning for pain, called e-link for pain med. Dilaudid 0.5 mg ordered. Pt BP been unstable after administering 0.5 dilaudid; BP been extremely down, levo increased; E-link notified and possibly discontinue the pain med. Pt responsive to voice; tactile stimuli. Continue to monitor vital signs and titrate as tolerated.

## 2022-03-02 NOTE — Progress Notes (Signed)
[facility] Progress Note Patient Name: Mike Mack DOB: [birth year] MRN: [id number]   Date of Service  03/02/2022  HPI/Events of Note  Received request for pain medication.   Pt with medial tibial plateau fracture.  Crea 3.34.   eICU Interventions  Low dose dilaudid IV ordered PRN.      Intervention Category Intermediate Interventions: Pain - evaluation and management  Elsie Lincoln 03/02/2022, 1:42 AM

## 2022-03-02 NOTE — Progress Notes (Addendum)
NAME:  Mike Mack, MRN:  [id number], DOB:  07/03/1935, LOS: 2 ADMISSION DATE:  02/28/2022, CONSULTATION DATE: 02/28/2022 REFERRING MD: Dr. Jeanell Sparrow, CHIEF COMPLAINT: Weakness  History of Present Illness:  Mike Mack is a 86 y.o. M with PMH of Polio and CVA with residual R-sided weakness, HTN, CKD, Type 2 DM, SBO who was brought in after he slumped over in his wheelchair on the way to the an appointment today.  His wife is at the bedside and states that he has had throat pain with swallowing for approximately three weeks and subsequently very little po intake.  She denies fevers, chills, aspiration event, nausea or vomiting.   He had a recent fall with tib/fib fx on the R.    On presentation to the ED he was hypotensive and hypothermic, was given 30cc/kg IVF.  UA consistent with hematuria and UTI, CTH negative, CXR without acute process.  Labs significant for AKI with creatinine 5.4, K 5.4, WBC 9, lactic acid 1->2.1.  He was given vancomycin and cefepime and started on Levophed. PCCM consulted for admission  Pertinent  Medical History  Anemia  Arthritis BPH  Constipation  History of kidney stones History of stomach ulcers Hypertension Neuropathy Polio (Childhood) Small bowel obstruction  Stroke  Type II diabetes mellitus   Significant Hospital Events: Including procedures, antibiotic start and stop dates in addition to other pertinent events   9/12-presented with generalized weakness, UTI and AKI, PCCM admitted 9/14 RN reports 2 bloody BMs overnight with 2 point drop in hemoglobin this am.   Interim History / Subjective:  Seen lying in bed with no acute complaints, denise pain but observed grimace with ABD palpation   Objective   Blood pressure (!) 153/52, pulse (!) 42, temperature 98.8 F (37.1 C), resp. rate 14, height '5\' 5"'$  (1.651 m), weight 76 kg, SpO2 (!) 64 %. CVP:  [0 mmHg-65 mmHg] 49 mmHg      Intake/Output Summary (Last 24 hours) at 03/02/2022 9562 Last data filed at  03/02/2022 0522 Gross per 24 hour  Intake 2894.61 ml  Output 1500 ml  Net 1394.61 ml    Filed Weights   02/28/22 2000 03/01/22 0314 03/02/22 0500  Weight: 76 kg 76 kg 76 kg    Examination: General: Acute on chronically ill appearing elderly male lying in bed in NAD HEENT: MC/AT, MM pink/moist, PERRL,  Neuro: Sleepy this am but will arouse to verbal stimuli and can state name  CV: s1s2 regular rate and rhythm, no murmur, rubs, or gallops,  PULM:  Clear to ascultation, no increased work of breathing, no added breath sounds  GI: soft, bowel sounds active in all 4 quadrants, tender to palpitation, slightly distended Extremities: warm/dry, no edema  Skin: no rashes or lesions  Resolved Hospital Problem list     Assessment & Plan:  Septic shock secondary to UTI -Patient presented with hypothermia, bradycardia, and hypotension with eleavted lactic of 2.1 -UA with rare bacteria and large leukocytes, last UTI positive for pseudomonas March 2023 P: Continue to follow cultures  Continue IV Cefepime for total of 3 days after which monitor off antibiotics  Pressors for MAP goal < 65  Procalcitonin Monitor urine output  Bloody bowel movements with history of both gastric and duodenal ulcers  -RN reports 2 bloody BMs overnight 9/14 with 2 point drop in HGB. -On chart review patient underwent upper endoscopy with  Ronnette Juniper, MD June 2022 with both gastric and duodenal ulcers were seen  P: GI consulted  Continue BID PPI Trend CBC  Monitor for signs of bleeding  Hgb goal > 7  Acute on chronic stage IIIb renal failure -Creatinine on admit 5.44 with GFR 10, creatinine March 2023 1.80 with GFR 37 P: Renal function slowly improving, awaiting am labs  Follow renal function  Monitor urine output Trend Bmet Avoid nephrotoxins Ensure adequate renal perfusion   Type 2 diabetes -Home medications include Tresiba and Actos  P: Continue SSI  CBG Q4hrs  CBG goal 140-180  Sore  throat -Evaluated by SLP 9/13 with large pocket of food observed in right cheek with decreased mentation P: NPO per SLP  Hypothyroidism P: Continue home Synthroid   Best Practice (right click and "Reselect all SmartList Selections" daily)   Diet/type: NPO DVT prophylaxis: prophylactic heparin  GI prophylaxis: N/A Lines: Central line Foley:  Yes, and it is still needed Code Status:  full code Last date of multidisciplinary goals of care discussion:  Update family daily, no family at bedside this am   Critical care: 38 mins  Christino Mcglinchey D. Kenton Kingfisher, NP-C [facility] Personal contact information can be found on Amion  03/02/2022, 7:31 AM

## 2022-03-02 NOTE — Progress Notes (Signed)
eLink Physician-Brief Progress Note Patient Name: Mike Mack DOB: [birth year] MRN: [id number]   Date of Service  03/02/2022  HPI/Events of Note  Notified of dark red stool this morning.    eICU Interventions  Start on protonix '40mg'$  BID.  Check CBC and BMP now.  Hold heparin.      Intervention Category Intermediate Interventions: Bleeding - evaluation and treatment with blood products  Elsie Lincoln 03/02/2022, 6:45 AM

## 2022-03-02 NOTE — Progress Notes (Signed)
SLP Cancellation Note  Patient Details Name: Mike Mack MRN: [id number] DOB: [birth year]   Cancelled treatment:       Reason Eval/Treat Not Completed: Patient's level of consciousness. Pt still too lethargic for oral intake. Will f/u    Jakala Herford, Katherene Ponto 03/02/2022, 10:59 AM

## 2022-03-02 NOTE — Progress Notes (Signed)
PCCM Progress Note  Notified by RN that patient has had a total of 3 bloody BMs with clots during shift thus far. Pressor requirement has waxed and weaned throughout the day but overall patient remains hemodynamically stable. Hemoglobin was repeated and further drop to 7.1 was seen. Will type and screen and transfuse one unit PRBC. Repeat H&H after. Tentative plan for upper and lower EGD in am.   Mike Mack D. Kenton Kingfisher, NP-C [facility] Personal contact information can be found on Amion  03/02/2022, 6:12 PM

## 2022-03-02 NOTE — Consult Note (Signed)
Referring Provider: [facility] Primary Care Physician:  [name], MD Primary Gastroenterologist:  [facility] (Dr. Penelope Coop)   Reason for Consultation:  Dark red blood in stool  HPI: Mike Mack is a 86 y.o. male with PMH of Polio and CVA with residual R-sided weakness, HTN, CKD, Type 2 DM, SBO who presented to the ED 02/28/2022 for fatigue and altered mental status was noticed on the way to one of his medical appointments.  She was diagnosed with UTI, AKI, altered mental status and admitted for treatment with fluids, pressors and antibiotics.  Overnight nurse reported 2 episodes of dark red blood in stool.  Patient had subsequent two-point drop in hemoglobin from 0.9-8.2 this morning.  Patient was started on Protonix 40 mg twice daily GI was consulted.  His wife is at bedside.  Patient reports no previous episodes of GI bleed, diagnosed previously seen for an EGD for melena and anemia in 2022.  Denies melena or hematochezia since then.  Wife denies any use.  Denies any blood thinning medication aside from aspirin 81 mg.  Prior to hospitalization denies melena, hematochezia, hematemesis.  Denies abdominal pain, constipation, nausea, vomiting.  Denies alcohol use.  Denies smoking.  EGD with Dr. Therisa Doyne 11/30/2020: Normal esophagus, nonbleeding gastric ulcer Forrest class III, nonbleeding duodenal ulcer x2 Forrest class III  His last colonoscopy 05/04/2009 with Dr. Penelope Coop, normal no evidence of diverticulosis.  Past Medical History:  Diagnosis Date   Anemia 05/13/2013   Arthritis    "right leg" (07/25/2013)   BPH (benign prostatic hyperplasia)    Constipation 05/13/2013   Exertional shortness of breath    "sometimes" (07/25/2013)   History of kidney stones    History of stomach ulcers    Hypertension    Neuropathy    Polio    Polio Childhood   Small bowel obstruction ([place])    Stroke ([place]) 2014   residual:  "left hand kind of numb" (07/25/2013)   Type II diabetes mellitus ([place])     Past  Surgical History:  Procedure Laterality Date   BIOPSY  11/30/2020   Procedure: BIOPSY;  Surgeon: Ronnette Juniper, MD;  Location: [facility];  Service: Gastroenterology;;   BOWEL RESECTION     CATARACT EXTRACTION W/ INTRAOCULAR LENS  IMPLANT, BILATERAL Bilateral 2000's   CHOLECYSTECTOMY N/A 07/25/2013   Procedure: LAPAROSCOPIC CHOLECYSTECTOMY WITH INTRAOPERATIVE CHOLANGIOGRAM;  Surgeon: [name], MD;  Location: [facility];  Service: General;  Laterality: N/A;   COLON SURGERY     COLONOSCOPY     Hx: of   CYSTOSCOPY WITH BIOPSY N/A 12/01/2019   Procedure: CYSTOSCOPY WITH BLADDER BIOPSY AND FULGURATION;  Surgeon: Lucas Mallow, MD;  Location: [facility];  Service: Urology;  Laterality: N/A;   ESOPHAGOGASTRODUODENOSCOPY N/A 11/30/2020   Procedure: ESOPHAGOGASTRODUODENOSCOPY (EGD);  Surgeon: Ronnette Juniper, MD;  Location: [place];  Service: Gastroenterology;  Laterality: N/A;   EXCISIONAL HEMORRHOIDECTOMY  2000's   EXTRACORPOREAL SHOCK WAVE LITHOTRIPSY Right 10/03/2018   Procedure: EXTRACORPOREAL SHOCK WAVE LITHOTRIPSY (ESWL);  Surgeon: Festus Aloe, MD;  Location: [facility];  Service: Urology;  Laterality: Right;   EYE SURGERY     INGUINAL HERNIA REPAIR Right 1970's   IR RADIOLOGIST EVAL & MGMT  04/06/2020   LAPAROSCOPIC CHOLECYSTECTOMY  07/25/2013   w/LOA (07/25/2013)    Prior to Admission medications   Medication Sig Start Date End Date Taking? Authorizing Provider  acetaminophen (TYLENOL) 325 MG tablet Take 2 tablets (650 mg total) by mouth every 6 (six) hours as needed for  mild pain (or Fever >/= 101). 11/27/20  Yes Debbe Odea, MD  amLODipine (NORVASC) 5 MG tablet Take 5 mg by mouth daily. 12/17/20  Yes [provider]  aspirin EC 81 MG tablet Take 1 tablet (81 mg total) by mouth daily. 12/16/20  Yes Lavina Hamman, MD  atorvastatin (LIPITOR) 20 MG tablet Take 20 mg by mouth every evening.  11/03/13  Yes [provider]  B Complex Vitamins (B COMPLEX PO) Take 1 tablet by  mouth at bedtime.   Yes [provider]  brimonidine (ALPHAGAN) 0.2 % ophthalmic solution Place 1 drop into both eyes 3 (three) times daily.   Yes [provider]  cholecalciferol (VITAMIN D3) 10 MCG (400 UNIT) TABS tablet Take 400 Units by mouth daily.   Yes [provider]  dorzolamide-timolol (COSOPT) 22.3-6.8 MG/ML ophthalmic solution Place 1 drop into both eyes 2 (two) times daily. 03/04/20  Yes [provider]  feeding supplement, GLUCERNA SHAKE, (GLUCERNA SHAKE) LIQD Take 237 mLs by mouth 3 (three) times daily between meals. Patient taking differently: Take 237 mLs by mouth 2 (two) times daily between meals. 03/25/20  Yes Kc, Maren Beach, MD  Finerenone (KERENDIA) 10 MG TABS Take 10 mg by mouth daily.   Yes [provider]  gabapentin (NEURONTIN) 600 MG tablet Take 600 mg by mouth 2 (two) times daily as needed for headache. 01/21/21  Yes [provider]  HYDROcodone-acetaminophen (NORCO/VICODIN) 5-325 MG tablet Take 1 tablet by mouth every 4 (four) hours as needed. Patient taking differently: Take 1 tablet by mouth every 4 (four) hours as needed (for pain, when not taking Gabapentin). 02/12/22  Yes Wynona Dove A, DO  insulin degludec (TRESIBA FLEXTOUCH) 100 UNIT/ML FlexTouch Pen Inject 15 Units into the skin daily. 09/08/20  Yes [provider]  latanoprost (XALATAN) 0.005 % ophthalmic solution Place 1 drop into both eyes at bedtime.  11/17/14  Yes [provider]  levothyroxine (SYNTHROID) 88 MCG tablet Take 88 mcg by mouth at bedtime.  04/21/11  Yes [provider]  memantine (NAMENDA) 10 MG tablet Take 10 mg by mouth in the morning and at bedtime. 11/04/20  Yes [provider]  metoprolol tartrate (LOPRESSOR) 25 MG tablet Take 1 tablet (25 mg total) by mouth 2 (two) times daily. Patient taking differently: Take 25 mg by mouth in the morning. 12/09/20  Yes Lavina Hamman, MD  Multiple Vitamin (MULTIVITAMIN WITH  MINERALS) TABS tablet Take 1 tablet by mouth daily. 09/06/13  Yes [name], MD  olmesartan (BENICAR) 20 MG tablet Take 20 mg by mouth daily. 02/13/21  Yes [provider]  pantoprazole (PROTONIX) 40 MG tablet Take 1 tablet (40 mg total) by mouth 2 (two) times daily before a meal. Patient taking differently: Take 40 mg by mouth in the morning and at bedtime. 12/09/20  Yes Lavina Hamman, MD  pioglitazone (ACTOS) 30 MG tablet Take 30 mg by mouth daily. 12/16/20  Yes [provider]  polyethylene glycol (MIRALAX / GLYCOLAX) packet Take 17 g by mouth daily. Patient taking differently: Take 17 g by mouth daily as needed for mild constipation. 09/06/13  Yes [name], MD  RHOPRESSA 0.02 % SOLN Place 1 drop into both eyes at bedtime. 03/05/20  Yes [provider]  Tamsulosin HCl (FLOMAX) 0.4 MG CAPS Take 0.4 mg by mouth in the morning and at bedtime.   Yes [provider]  ondansetron (ZOFRAN-ODT) 8 MG disintegrating tablet Take 1 tablet (8 mg  total) by mouth every 8 (eight) hours as needed for nausea. Patient not taking: Reported on 02/28/2022 08/28/21   Varney Biles, MD    Scheduled Meds:  brimonidine  1 drop Both Eyes TID   Chlorhexidine Gluconate Cloth  6 each Topical Daily   dorzolamide-timolol  1 drop Both Eyes BID   heparin  5,000 Units Subcutaneous Q8H   insulin aspart  0-9 Units Subcutaneous Q4H   latanoprost  1 drop Both Eyes QHS   levothyroxine  88 mcg Oral QAC breakfast   Netarsudil Dimesylate  1 drop Both Eyes QHS   pantoprazole (PROTONIX) IV  40 mg Intravenous Q12H   Continuous Infusions:  sodium chloride     ceFEPime (MAXIPIME) IV Stopped (03/01/22 1239)   lactated ringers Stopped (02/28/22 1159)   norepinephrine (LEVOPHED) Adult infusion 3 mcg/min (03/02/22 0734)   PRN Meds:.docusate sodium, HYDROmorphone (DILAUDID) injection, mouth rinse, polyethylene glycol  Allergies as of 02/28/2022 - Review Complete 02/28/2022   Allergen Reaction Noted   Metformin and related Other (See Comments) 03/23/2019   [name] [insulin glargine-lixisenatide] Other (See Comments) 03/23/2019    Family History  Problem Relation Age of Onset   Heart failure Mother    Hypertension Sister    Diabetes Sister    Diabetes Brother    Hypertension Brother     Social History   Socioeconomic History   Marital status: Married    Spouse name: Roberts Bon   Number of children: 2   Years of education: Not on file   Highest education level: Not on file  Occupational History   Occupation: Retired, Curator  Tobacco Use   Smoking status: Never   Smokeless tobacco: Never  Vaping Use   Vaping Use: Never used  Substance and Sexual Activity   Alcohol use: No   Drug use: No   Sexual activity: Yes  Other Topics Concern   Not on file  Social History Narrative   Married.  Lives in [place] with wife.  Ambulates with a cane.   Social Determinants of Health   Financial Resource Strain: Not on file  Food Insecurity: Not on file  Transportation Needs: Not on file  Physical Activity: Not on file  Stress: Not on file  Social Connections: Not on file  Intimate Partner Violence: Not on file    Review of Systems: All negative except as stated above in HPI.  Physical Exam:Physical Exam Constitutional:      General: He is not in acute distress.    Appearance: He is normal weight.  HENT:     Head: Normocephalic and atraumatic.     Right Ear: External ear normal.     Left Ear: External ear normal.     Nose: Nose normal.     Mouth/Throat:     Mouth: Mucous membranes are moist.  Eyes:     Pupils: Pupils are equal, round, and reactive to light.     Comments: Conjunctival pallor  Cardiovascular:     Rate and Rhythm: Normal rate and regular rhythm.     Pulses: Normal pulses.     Heart sounds: Normal heart sounds.  Pulmonary:     Effort: Pulmonary effort is normal.     Breath sounds: Normal breath sounds.  Abdominal:      General: Abdomen is flat. Bowel sounds are normal. There is no distension.     Palpations: Abdomen is soft. There is no mass.     Tenderness: There is no abdominal tenderness. There is no guarding  or rebound.     Hernia: No hernia is present.  Musculoskeletal:        General: Normal range of motion.     Cervical back: Normal range of motion and neck supple.  Skin:    General: Skin is warm and dry.     Coloration: Skin is pale.  Neurological:     General: No focal deficit present.     Mental Status: He is alert and oriented to person, place, and time. Mental status is at baseline.  Psychiatric:        Mood and Affect: Mood normal.        Behavior: Behavior normal.     Vital signs: Vitals:   03/02/22 0600 03/02/22 0700  BP: (!) 119/39 (!) 153/50  Pulse:    Resp: 13 (!) 9  Temp: 98.8 F (37.1 C) 98.4 F (36.9 C)  SpO2:     Last BM Date : 03/01/22    GI:  Lab Results: Recent Labs    02/28/22 1408 03/01/22 0415 03/02/22 0730  WBC 9.8 9.5 9.5  HGB 10.2* 10.9* 8.2*  HCT 31.3* 32.4* 24.5*  PLT 204 226 183   BMET Recent Labs    02/28/22 1720 03/01/22 0415 03/02/22 0730  NA 136 135 141  K 4.7 4.3 4.1  CL 109 108 119*  CO2 16* 17* 19*  GLUCOSE 232* 254* 221*  BUN 154* 130* 110*  CREATININE 4.16* 3.34* 1.82*  CALCIUM 8.0* 8.1* 7.8*   LFT Recent Labs    02/28/22 1720  PROT 5.3*  ALBUMIN 2.0*  AST 12*  ALT 14  ALKPHOS 79  BILITOT 1.0   PT/INR Recent Labs    02/28/22 1022  LABPROT 16.9*  INR 1.4*     Studies/Results: DG CHEST PORT 1 VIEW  Result Date: 02/28/2022 CLINICAL DATA:  Status post central venous catheter insertion. EXAM: PORTABLE CHEST 1 VIEW COMPARISON:  Radiograph February 28, 2022 at 1122 hours. FINDINGS: Right IJ central venous catheter with tip projecting over the superior cavoatrial junction. Cardiac monitoring leads are coiled over the chest. The heart size and mediastinal contours are unchanged. No focal airspace  consolidation. No visible pleural effusion or pneumothorax. The visualized skeletal structures are unremarkable. IMPRESSION: Interval placement of a right IJ central venous catheter with tip projecting of the superior cavoatrial junction. No visible pneumothorax. Electronically Signed   By: Dahlia Bailiff M.D.   On: 02/28/2022 17:15   US RENAL  Result Date: 02/28/2022 CLINICAL DATA:  Acute kidney injury. EXAM: RENAL / URINARY TRACT ULTRASOUND COMPLETE COMPARISON:  CT abdomen pelvis dated September 16, 2021. Renal ultrasound dated December 03, 2020. FINDINGS: Right Kidney: Renal measurements: 10.0 x 4.4 x 5.4 cm = volume: 124 mL. Echogenicity within normal limits. No mass or hydronephrosis visualized. Two simple cysts in the upper pole measuring up to 1.9 cm. Left Kidney: Renal measurements: 11.1 x 5.0 x 5.1 cm = volume: 144 mL. Echogenicity within normal limits. No mass or hydronephrosis visualized. Two simple cysts measuring up to 4.1 cm. Bladder: Decompressed by Foley catheter. Other: None. IMPRESSION: 1. No acute abnormality. 2. Bilateral renal simple cysts. No follow-up imaging is recommended. Electronically Signed   By: Titus Dubin M.D.   On: 02/28/2022 16:33   DG Knee Right Port  Result Date: 02/28/2022 CLINICAL DATA:  Altered mental status.  Post fall. EXAM: PORTABLE RIGHT KNEE - 1-2 VIEW COMPARISON:  Right knee radiographs-02/12/2022;; 02/02/2022; right knee CT-02/02/2022; pelvic and right femur radiographs-earlier same day FINDINGS: Osteopenia.  Examination is further degraded due to obliquity and large field of view Redemonstrated slightly impacted medial tibial plateau fracture with persistent deformity though alignment appears unchanged compared to right knee radiographs performed 02/02/2022. Moderate-sized residual knee joint effusion. No evidence of lipohemarthrosis. Persistent chronic deformity of the patella without acute displaced fracture. Moderate tricompartmental degenerative change of the  knee, worse within the lateral compartment, is suspected though incompletely evaluated due to obliquity. Chondrocalcinosis is seen within the lateral joint space. Distal vascular calcifications. No radiopaque foreign body. IMPRESSION: 1. Similar appearance of slightly impacted medial tibial plateau fracture without change in alignment compared to knee radiographs performed 02/02/2022. 2. Moderate-sized residual knee joint effusion without evidence of lipohemarthrosis. 3. Chronic deformity of the patella without acute displaced fracture. 4. Moderate tricompartmental degenerative change of the knee, worse within the lateral compartment. 5. Chondrocalcinosis of the lateral joint space as could be seen in the setting of CPPD. Electronically Signed   By: Sandi Mariscal M.D.   On: 02/28/2022 13:17   DG Chest Port 1 View  Result Date: 02/28/2022 CLINICAL DATA:  Altered mental status EXAM: PORTABLE CHEST 1 VIEW COMPARISON:  12/12/2021 FINDINGS: Cardiac size is within normal limits. There are no signs of pulmonary edema or focal pulmonary consolidation. There is blunting of both lateral CP angles. There is no pneumothorax. Left hemidiaphragm is elevated. IMPRESSION: There are no signs of pulmonary edema or focal pulmonary consolidation. Small bilateral pleural effusions. Electronically Signed   By: Elmer Picker M.D.   On: 02/28/2022 12:00   DG Pelvis 1-2 Views  Result Date: 02/28/2022 CLINICAL DATA:  Lethargy, slumped over in wheelchair. EXAM: PELVIS - 1-2 VIEW COMPARISON:  Hip radiographs 02/12/2022 FINDINGS: There is no definite evidence of acute fracture, though positioning is suboptimal. Femoroacetabular alignment appears maintained. Soft tissues are unremarkable. A wire projects over the bladder. IMPRESSION: No definite acute fracture or dislocation, though positioning is suboptimal. If there is persistent clinical concern, repeat radiographs or cross-sectional imaging may be obtained. Electronically Signed    By: Valetta Mole M.D.   On: 02/28/2022 11:58   DG FEMUR, MIN 2 VIEWS RIGHT  Result Date: 02/28/2022 CLINICAL DATA:  Lethargy, fall EXAM: RIGHT FEMUR 2 VIEWS COMPARISON:  None Available. FINDINGS: No displaced fracture is seen in right femur. Osteopenia is seen in bony structures. Possible small effusion is present in the right knee. There are scattered arterial calcifications in soft tissues. IMPRESSION: No fracture or dislocation is seen in right femur. Possible small effusion is present in right knee. Electronically Signed   By: Elmer Picker M.D.   On: 02/28/2022 11:58   CT Head Wo Contrast  Result Date: 02/28/2022 CLINICAL DATA:  Mental status change EXAM: CT HEAD WITHOUT CONTRAST TECHNIQUE: Contiguous axial images were obtained from the base of the skull through the vertex without intravenous contrast. RADIATION DOSE REDUCTION: This exam was performed according to the departmental dose-optimization program which includes automated exposure control, adjustment of the mA and/or kV according to patient size and/or use of iterative reconstruction technique. COMPARISON:  CT head 02/21/2021 FINDINGS: Brain: Diffuse ventricular enlargement, unchanged. Biventricular diameter 57 mm, unchanged. Third fourth and lateral ventricles all dilated. Patchy periventricular white matter hypodensity bilaterally unchanged. No acute infarct, hemorrhage, mass. 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 Vascular: Negative for hyperdense vessel Skull: Negative Sinuses/Orbits: Paranasal sinuses clear. Right mastoid effusion. Left mastoid sinus clear. Bilateral cataract extraction Other: None IMPRESSION: Communicating hydrocephalus, unchanged. Correlate with symptoms of normal pressure hydrocephalus. Chronic ischemic changes in the  white matter and cerebellum bilaterally. No acute infarct or hemorrhage. Electronically Signed   By: Franchot Gallo M.D.   On: 02/28/2022 11:25    Impression:  Blood in stool   History of duodenal ulcer  HGB 8.2(10.9) Platelets 183 AST 12 ALT 14  Alkphos 79 TBili 1.0 GFR 36   Patient with 2 episodes of dark red blood in stool overnight.  No reported previous melena or hematochezia prior to admission.  Patient with history of gastric and duodenal ulcers in 2022.  No previous evidence of diverticulosis on colonoscopy from 2010.  CT scan from 2020 noted scattered colonic diverticula.  Overnight BUN improved from 130-110.  With history of significant gastric and duodenal ulcers there is concern for possible upper GI bleed.   Plan: Plan for EGD tentatively tomorrow. I thoroughly discussed the procedures to include nature, alternatives, benefits, and risks including but not limited to bleeding, perforation, infection, anesthesia/cardiac and pulmonary complications. Patient provides understanding and gave verbal consent to proceed. Continue Protonix IV '40mg'$  BID NPO at midnight Continue anti-emetics and supportive care as needed. [facility] will follow.     LOS: 2 days   Charlott Rakes  PA-C 03/02/2022, 11:06 AM  Contact #  [phone]

## 2022-03-03 ENCOUNTER — Inpatient Hospital Stay (HOSPITAL_COMMUNITY): Payer: Medicare HMO | Admitting: Certified Registered"

## 2022-03-03 ENCOUNTER — Encounter (HOSPITAL_COMMUNITY): Payer: Self-pay | Admitting: Pulmonary Disease

## 2022-03-03 ENCOUNTER — Encounter (HOSPITAL_COMMUNITY)
Admission: EM | Disposition: A | Discharge status: Skilled Nursing Facility | Payer: Self-pay | Source: Home / Self Care | Attending: Pulmonary Disease

## 2022-03-03 DIAGNOSIS — D638 Anemia in other chronic diseases classified elsewhere: Secondary | ICD-10-CM

## 2022-03-03 DIAGNOSIS — I10 Essential (primary) hypertension: Secondary | ICD-10-CM

## 2022-03-03 DIAGNOSIS — N179 Acute kidney failure, unspecified: Secondary | ICD-10-CM | POA: Diagnosis not present

## 2022-03-03 DIAGNOSIS — R6521 Severe sepsis with septic shock: Secondary | ICD-10-CM | POA: Diagnosis not present

## 2022-03-03 DIAGNOSIS — K259 Gastric ulcer, unspecified as acute or chronic, without hemorrhage or perforation: Secondary | ICD-10-CM

## 2022-03-03 DIAGNOSIS — E039 Hypothyroidism, unspecified: Secondary | ICD-10-CM

## 2022-03-03 DIAGNOSIS — K209 Esophagitis, unspecified without bleeding: Secondary | ICD-10-CM

## 2022-03-03 DIAGNOSIS — A419 Sepsis, unspecified organism: Secondary | ICD-10-CM | POA: Diagnosis not present

## 2022-03-03 HISTORY — PX: ESOPHAGOGASTRODUODENOSCOPY: SHX5428

## 2022-03-03 HISTORY — PX: BIOPSY: SHX5522

## 2022-03-03 LAB — BASIC METABOLIC PANEL
Anion gap: 2 — ABNORMAL LOW (ref 5–15)
BUN: 94 mg/dL — ABNORMAL HIGH (ref 8–23)
CO2: 20 mmol/L — ABNORMAL LOW (ref 22–32)
Calcium: 8.1 mg/dL — ABNORMAL LOW (ref 8.9–10.3)
Chloride: 121 mmol/L — ABNORMAL HIGH (ref 98–111)
Creatinine, Ser: 1.44 mg/dL — ABNORMAL HIGH (ref 0.61–1.24)
GFR, Estimated: 48 mL/min — ABNORMAL LOW (ref >60)
Glucose, Bld: 169 mg/dL — ABNORMAL HIGH (ref 70–99)
Potassium: 4.1 mmol/L (ref 3.5–5.1)
Sodium: 143 mmol/L (ref 135–145)

## 2022-03-03 LAB — CBC
HCT: 25.6 % — ABNORMAL LOW (ref 39.0–52.0)
Hemoglobin: 8.8 g/dL — ABNORMAL LOW (ref 13.0–17.0)
MCH: 31.3 pg (ref 26.0–34.0)
MCHC: 34.4 g/dL (ref 30.0–36.0)
MCV: 91.1 fL (ref 80.0–100.0)
Platelets: 151 10*3/uL (ref 150–400)
RBC: 2.81 MIL/uL — ABNORMAL LOW (ref 4.22–5.81)
RDW: 14.5 % (ref 11.5–15.5)
WBC: 8.5 10*3/uL (ref 4.0–10.5)
nRBC: 0.2 % (ref 0.0–0.2)

## 2022-03-03 LAB — GLUCOSE, CAPILLARY
Glucose-Capillary: 140 mg/dL — ABNORMAL HIGH (ref 70–99)
Glucose-Capillary: 134 mg/dL — ABNORMAL HIGH (ref 70–99)
Glucose-Capillary: 125 mg/dL — ABNORMAL HIGH (ref 70–99)
Glucose-Capillary: 109 mg/dL — ABNORMAL HIGH (ref 70–99)
Glucose-Capillary: 111 mg/dL — ABNORMAL HIGH (ref 70–99)

## 2022-03-03 SURGERY — EGD (ESOPHAGOGASTRODUODENOSCOPY)
Anesthesia: Monitor Anesthesia Care

## 2022-03-03 MED ORDER — PHENYLEPHRINE 80 MCG/ML (10ML) SYRINGE FOR IV PUSH (FOR BLOOD PRESSURE SUPPORT)
PREFILLED_SYRINGE | INTRAVENOUS | Status: DC | PRN
  Administered 2022-03-03 (×3): 80 ug via INTRAVENOUS

## 2022-03-03 MED ORDER — FENTANYL CITRATE PF 50 MCG/ML IJ SOSY
25.0000 ug | PREFILLED_SYRINGE | INTRAMUSCULAR | Status: DC | PRN
  Administered 2022-03-03 – 2022-03-04 (×5): 50 ug via INTRAVENOUS
  Administered 2022-03-06: 25 ug via INTRAVENOUS
  Administered 2022-03-06 – 2022-03-07 (×2): 50 ug via INTRAVENOUS
  Filled 2022-03-03 (×8): qty 1

## 2022-03-03 MED ORDER — PROPOFOL 500 MG/50ML IV EMUL
INTRAVENOUS | Status: AC
  Filled 2022-03-03: qty 50

## 2022-03-03 MED ORDER — PROPOFOL 10 MG/ML IV BOLUS
INTRAVENOUS | Status: DC | PRN
  Administered 2022-03-03: 20 mg via INTRAVENOUS

## 2022-03-03 MED ORDER — NOREPINEPHRINE 4 MG/250ML-% IV SOLN
0.0000 ug/min | INTRAVENOUS | Status: DC
  Filled 2022-03-03: qty 250

## 2022-03-03 MED ORDER — PROPOFOL 10 MG/ML IV BOLUS
INTRAVENOUS | Status: AC
  Filled 2022-03-03: qty 20

## 2022-03-03 MED ORDER — LACTATED RINGERS IV SOLN: INTRAVENOUS | Status: AC

## 2022-03-03 MED ORDER — PROPOFOL 500 MG/50ML IV EMUL
INTRAVENOUS | Status: DC | PRN
  Administered 2022-03-03: 60 ug/kg/min via INTRAVENOUS

## 2022-03-03 MED ORDER — SODIUM CHLORIDE 0.9 % IV SOLN: INTRAVENOUS | Status: DC | PRN

## 2022-03-03 NOTE — Progress Notes (Signed)
NAME:  Mike Mack, MRN:  [id number], DOB:  26-Aug-1935, LOS: 3 ADMISSION DATE:  02/28/2022, CONSULTATION DATE: 02/28/2022 REFERRING MD: Dr. Jeanell Mack, CHIEF COMPLAINT: Weakness  History of Present Illness:  Mike Mack is a 86 y.o. M with PMH of Polio and CVA with residual R-sided weakness, HTN, CKD, Type 2 DM, SBO who was brought in after he slumped over in his wheelchair on the way to the an appointment today.  His wife is at the bedside and states that he has had throat pain with swallowing for approximately three weeks and subsequently very little po intake.  She denies fevers, chills, aspiration event, nausea or vomiting.   He had a recent fall with tib/fib fx on the R.    On presentation to the ED he was hypotensive and hypothermic, was given 30cc/kg IVF.  UA consistent with hematuria and UTI, CTH negative, CXR without acute process.  Labs significant for AKI with creatinine 5.4, K 5.4, WBC 9, lactic acid 1->2.1.  He was given vancomycin and cefepime and started on Levophed. PCCM consulted for admission  Pertinent  Medical History  Anemia  Arthritis BPH  Constipation  History of kidney stones History of stomach ulcers Hypertension Neuropathy Polio (Childhood) Small bowel obstruction  Stroke  Type II diabetes mellitus   Significant Hospital Events: Including procedures, antibiotic start and stop dates in addition to other pertinent events   9/12-presented with generalized weakness, UTI and AKI, PCCM admitted 9/14 RN reports 2 bloody BMs overnight with 2 point drop in hemoglobin this am.  9/15 two dark colored stools overnight, received one unit PRBC overnight with hgb bump to 8.8  Interim History / Subjective:  Seen lying in bed with flat affect, only able to state name   Objective   Blood pressure (!) 80/27, pulse 93, temperature 99.7 F (37.6 C), resp. rate (!) 21, height '5\' 5"'$  (1.651 m), weight 74.5 kg, SpO2 100 %. CVP:  [0 mmHg-5 mmHg] 4 mmHg      Intake/Output Summary  (Last 24 hours) at 03/03/2022 0710 Last data filed at 03/03/2022 0200 Gross per 24 hour  Intake 1885.41 ml  Output 1250 ml  Net 635.41 ml    Filed Weights   03/01/22 0314 03/02/22 0500 03/03/22 0500  Weight: 76 kg 76 kg 74.5 kg    Examination: General: Acute on chronically ill appearing elderly male lying in bed in NAD HEENT: Mike Mack/AT, MM pink/moist, PERRL,  Neuro: Flat affect, only able to state name  CV: s1s2 regular rate and rhythm, no murmur, rubs, or gallops,  PULM:  Clear to ascultation, no added breath sounds on RA GI: soft, bowel sounds active in all 4 quadrants, non-tender, slightly distended Extremities: warm/dry, no edema  Skin: no rashes or lesions  Resolved Hospital Problem list   Septic shock secondary to UTI -Pressors off 9/14, Received 3 days of IV Cefepime   Assessment & Plan:   Bloody bowel movements with history of both gastric and duodenal ulcers  -RN reports 2 bloody BMs overnight 9/14 with 2 point drop in HGB. -On chart review patient underwent upper endoscopy with  Mike Juniper, MD June 2022 with both gastric and duodenal ulcers were seen  P: GI consulted 9/14 Tentative plan for upper and possible lower EGD 9/15 Continue BID PPI Trend CBC  Monitor for continued signs of bleeding  Hgb goal > 7  Acute on chronic stage IIIb renal failure -Creatinine on admit 5.44 with GFR 10, creatinine March 2023 1.80 with GFR 37 P:  Renal function appears back to baseline  Follow renal function  Monitor urine output Trend Bmet Avoid nephrotoxins Ensure adequate renal perfusion  IV hydration  Type 2 diabetes -Home medications include Tresiba and Actos  P: Continue SSI  CBG q4hrs  CBG goal 140-180  Sore throat -Evaluated by SLP 9/13 with large pocket of food observed in right cheek with decreased mentation P: SLP continues to follow Remains NPO   Hypothyroidism P: Continue home synthroid   Deconditioning  Protein calorie  malnutrition   P: PT/OT  evals  SLP following for swallow assessment  Mobilize as able  Shock state resolved with no further pressors needs. TRH to assume care starting 9/16, PCCM will sign off. Thank you for the opportunity to participate in this patient's care. Please contact if we can be of further assistance.   Best Practice (right click and "Reselect all SmartList Selections" daily)   Diet/type: NPO DVT prophylaxis: prophylactic heparin  GI prophylaxis: N/A Lines: Central line Foley:  Yes, and it is still needed Code Status:  full code Last date of multidisciplinary goals of care discussion:  Update family daily, no family at bedside this am   Critical care: 37 mins  Mike Mack D. Kenton Kingfisher, NP-C [facility] Personal contact information can be found on Amion  03/03/2022, 7:10 AM

## 2022-03-03 NOTE — Progress Notes (Signed)
[place] Progress Note Patient Name: Mike Mack DOB: [birth year] MRN: [id number]   Date of Service  03/03/2022  HPI/Events of Note  Patient c/o stomach and leg pain related to tibial fx. No relief with Fentanyl 25 mcg IV.   eICU Interventions  Plan: Increase Fentanyl dose to 25-50 mcg IV Q 2 hours PRN pain.      Intervention Category Major Interventions: Other:  Lysle Dingwall 03/03/2022, 9:19 PM

## 2022-03-03 NOTE — Progress Notes (Signed)
Mike Mack 12:10 PM  Subjective: Patient a little more alert today says he has lower stomach pain and ICU note appreciated and no obvious new complaints  Objective: Vital signs stable afebrile no acute distress lying comfortably in the bed moaning some BUN and creatinine much improved hemoglobin continues to drop with hydration  Assessment: GI bleed probably upper  Plan: Okay to proceed with endoscopy with anesthesia assistance  [facility] E  office [phone] After 5PM or if no answer call [phone]

## 2022-03-03 NOTE — Op Note (Signed)
[facility] Patient Name: Mike Mack Procedure Date: 03/03/2022 MRN: [id number] Attending MD: Clarene Essex , MD Date of Birth: 02-May-1936 CSN: [id number] Age: 86 Admit Type: Inpatient Procedure:                Upper GI endoscopy Indications:              Acute post hemorrhagic anemia, Melena Providers:                Clarene Essex, MD, Earnstine Regal, RN, William Dalton,                            Technician Referring MD:              Medicines:                Monitored Anesthesia Care Complications:            No immediate complications. Estimated Blood Loss:     Estimated blood loss: none. Procedure:                Pre-Anesthesia Assessment:                           - Prior to the procedure, a History and Physical                            was performed, and patient medications and                            allergies were reviewed. The patient's tolerance of                            previous anesthesia was also reviewed. The risks                            and benefits of the procedure and the sedation                            options and risks were discussed with the patient.                            All questions were answered, and informed consent                            was obtained. Prior Anticoagulants: The patient has                            taken no previous anticoagulant or antiplatelet                            agents except for aspirin. ASA Grade Assessment:                            III - A patient with severe systemic disease. After  reviewing the risks and benefits, the patient was                            deemed in satisfactory condition to undergo the                            procedure.                           After obtaining informed consent, the endoscope was                            passed under direct vision. Throughout the                            procedure, the patient's blood pressure, pulse,  and                            oxygen saturations were monitored continuously. The                            GIF-H190 ([id number] Olympus endoscope was introduced                            through the mouth, and advanced to the third part                            of duodenum. The upper GI endoscopy was                            accomplished without difficulty. The patient                            tolerated the procedure well. Scope In: Scope Out: Findings:      The larynx was normal.      A small hiatal hernia was present.      Severe esophagitis with no bleeding was found. Biopsies were taken with       a cold forceps for histology.      Multiple non-bleeding superficial gastric ulcers and erosions with no       stigmata of bleeding were found in the cardia, in the gastric fundus and       in the gastric body. Biopsies were taken with a cold forceps for       histology.      The ampulla, second portion of the duodenum, major papilla and third       portion of the duodenum were normal.      The exam was otherwise without abnormality.      Few non-bleeding cratered and superficial duodenal ulcers and erosions       with no stigmata of bleeding were found in the duodenal bulb. Impression:               - Normal larynx.                           - Small hiatal hernia.                           -  Severe reflux versus ischemic esophagitis with no                            bleeding. Biopsied.                           - Non-bleeding gastric ulcers and erosions with no                            stigmata of bleeding. Biopsied.                           - Duodenal erosions and small ulcers without                            bleeding.                           - Normal second portion of the duodenum and third                            portion of the duodenum.                           - The examination was otherwise normal. Moderate Sedation:      Not Applicable - Patient had care  per Anesthesia. Recommendation:           - Clear liquid diet today if he passes a swallowing                            test.                           - Continue present medications. Long-term pump                            inhibitors evaluate aspirin needs                           - Await pathology results.                           - Return to GI clinic PRN.                           - Telephone GI clinic if symptomatic PRN. Further                            work-up and plans per critical care and hospital                            team please call me if I can be of any further                            assistance with this hospital stay                           -  Telephone GI clinic for pathology results in 1                            week. Procedure Code(s):        --- Professional ---                           [phone], Esophagogastroduodenoscopy, flexible,                            transoral; with biopsy, single or multiple Diagnosis Code(s):        --- Professional ---                           K44.9, Diaphragmatic hernia without obstruction or                            gangrene                           K21.00, Gastro-esophageal reflux disease with                            esophagitis, without bleeding                           K25.9, Gastric ulcer, unspecified as acute or                            chronic, without hemorrhage or perforation                           K26.9, Duodenal ulcer, unspecified as acute or                            chronic, without hemorrhage or perforation                           D62, Acute posthemorrhagic anemia                           K92.1, Melena (includes Hematochezia) CPT copyright 2019 [employer]. All rights reserved. The codes documented in this report are preliminary and upon coder review may  be revised to meet current compliance requirements. Clarene Essex, MD 03/03/2022 12:58:08 PM This report has been signed  electronically. Number of Addenda: 0

## 2022-03-03 NOTE — Transfer of Care (Addendum)
Immediate Anesthesia Transfer of Care Note  Patient: Mike Mack  Procedure(s) Performed: ESOPHAGOGASTRODUODENOSCOPY (EGD) BIOPSY  Patient Location: PACU and Endoscopy Unit  Anesthesia Type:MAC  Level of Consciousness: drowsy and responds to stimulation  Airway & Oxygen Therapy: Patient Spontanous Breathing and Patient connected to face mask oxygen  Post-op Assessment: Report given to RN and Post -op Vital signs reviewed and stable on levophed as preop  Post vital signs: Reviewed and stable on levophed as perop  Last Vitals:  Vitals Value Taken Time  BP    Temp    Pulse    Resp    SpO2      Last Pain:  Vitals:   03/03/22 1145  TempSrc: Bladder  PainSc:          Complications: No notable events documented.

## 2022-03-03 NOTE — Anesthesia Procedure Notes (Signed)
Procedure Name: MAC Date/Time: 03/03/2022 12:34 PM  Performed by: Eben Burow, CRNAPre-anesthesia Checklist: Patient identified, Emergency Drugs available, Suction available, Patient being monitored and Timeout performed Oxygen Delivery Method: Simple face mask Placement Confirmation: positive ETCO2

## 2022-03-03 NOTE — Anesthesia Preprocedure Evaluation (Signed)
Anesthesia Evaluation  Patient identified by MRN, date of birth, ID band Patient awake    Reviewed: Allergy & Precautions, H&P , NPO status , Patient's Chart, lab work & pertinent test results, reviewed documented beta blocker date and time   Airway Mallampati: II  TM Distance: >3 FB Neck ROM: full    Dental  (+) Edentulous Upper, Edentulous Lower, Dental Advidsory Given   Pulmonary shortness of breath,    breath sounds clear to auscultation       Cardiovascular hypertension, Pt. on medications and Pt. on home beta blockers Normal cardiovascular exam Rhythm:regular Rate:Normal     Neuro/Psych  Headaches, CVA, No Residual Symptoms    GI/Hepatic negative GI ROS, Neg liver ROS,   Endo/Other  diabetes, Well Controlled, Type 2, Insulin DependentHypothyroidism   Renal/GU Renal InsufficiencyRenal disease  negative genitourinary   Musculoskeletal   Abdominal Normal abdominal exam  (+)   Peds  Hematology  (+) Blood dyscrasia, anemia ,   Anesthesia Other Findings  ECHO - Left ventricle: The cavity size was normal. Wall thickness was  normal. Systolic function was normal. The estimated ejection  fraction was in the range of 60% to 65%. Wall motion was normal;  there were no regional wall motion abnormalities. Doppler  parameters are consistent with abnormal left ventricular  relaxation (grade 1 diastolic dysfunction). The E/e&' ratio is  between 8-15, suggesting indeterminate LV filling pressure.  - Left atrium: The atrium was normal in size.   Reproductive/Obstetrics negative OB ROS                             Anesthesia Physical  Anesthesia Plan  ASA: 4  Anesthesia Plan: MAC   Post-op Pain Management: Minimal or no pain anticipated   Induction: Intravenous  PONV Risk Score and Plan: 4 or greater and Propofol infusion  Airway Management Planned: Natural Airway and Simple Face  Mask  Additional Equipment: None  Intra-op Plan:   Post-operative Plan: Extubation in OR  Informed Consent: I have reviewed the patients History and Physical, chart, labs and discussed the procedure including the risks, benefits and alternatives for the proposed anesthesia with the patient or authorized representative who has indicated his/her understanding and acceptance.     Dental Advisory Given  Plan Discussed with: CRNA and Anesthesiologist  Anesthesia Plan Comments: ( Patient was brought to the hospital for progressive weakness, was found slumped over in his chair  Evaluation so far shows acute renal failure, severe dehydration, sepsis  Underlying history of childhood polio, history of stroke in 2014, type 2 diabetes, neuropathy)        Anesthesia Quick Evaluation

## 2022-03-03 NOTE — TOC Initial Note (Signed)
Transition of Care [facility]) - Initial/Assessment Note    Patient Details  Name: Mike Mack MRN: [id number] Date of Birth: 1935-07-27  Transition of Care [facility]) CM/SW Contact:    Leeroy Cha, RN Phone Number: 03/03/2022, 8:02 AM  Clinical Narrative:                  Transition of Care [facility]) Screening Note   Patient Details  Name: Mike Mack Date of Birth: March 20, 1936   Transition of Care [facility]) CM/SW Contact:    Leeroy Cha, RN Phone Number: 03/03/2022, 8:02 AM    Transition of Care Department [facility] has reviewed patient and no TOC needs have been identified at this time. We will continue to monitor patient advancement through interdisciplinary progression rounds. If new patient transition needs arise, please place a TOC consult.    Expected Discharge Plan: Home/Self Care Barriers to Discharge: Continued Medical Work up   Patient Goals and CMS Choice Patient states their goals for this hospitalization and ongoing recovery are:: to go home CMS Medicare.gov Compare Post Acute Care list provided to:: Patient    Expected Discharge Plan and Services Expected Discharge Plan: Home/Self Care   Discharge Planning Services: CM Consult   Living arrangements for the past 2 months: Single Family Home                                      Prior Living Arrangements/Services Living arrangements for the past 2 months: Single Family Home Lives with:: Spouse Patient language and need for interpreter reviewed:: Yes Do you feel safe going back to the place where you live?: Yes            Criminal Activity/Legal Involvement Pertinent to Current Situation/Hospitalization: No - Comment as needed  Activities of Daily Living Home Assistive Devices/Equipment: Wheelchair ADL Screening (condition at time of admission) Patient's cognitive ability adequate to safely complete daily activities?: No Is the patient deaf or have difficulty hearing?: No Does the  patient have difficulty seeing, even when wearing glasses/contacts?: No Does the patient have difficulty concentrating, remembering, or making decisions?: Yes (having intermittent confusion per computer notes) Patient able to express need for assistance with ADLs?: No Does the patient have difficulty dressing or bathing?: Yes Independently performs ADLs?: No Communication: Independent Dressing (OT): Needs assistance Is this a change from baseline?: Pre-admission baseline Grooming: Needs assistance Is this a change from baseline?: Pre-admission baseline Feeding: Needs assistance Is this a change from baseline?: Pre-admission baseline Bathing: Needs assistance Is this a change from baseline?: Pre-admission baseline Toileting: Needs assistance Is this a change from baseline?: Pre-admission baseline In/Out Bed: Needs assistance Is this a change from baseline?: Pre-admission baseline Walks in Home: Needs assistance Is this a change from baseline?: Pre-admission baseline Does the patient have difficulty walking or climbing stairs?: Yes Weakness of Legs: Both Weakness of Arms/Hands: Both  Permission Sought/Granted                  Emotional Assessment Appearance:: Appears stated age Attitude/Demeanor/Rapport: Engaged Affect (typically observed): Calm Orientation: : Oriented to Self, Oriented to Place, Oriented to  Time, Oriented to Situation Alcohol / Substance Use: Not Applicable Psych Involvement: No (comment)  Admission diagnosis:  Septic shock ([place]) [A41.9, R65.21] Hypotension, unspecified hypotension type [I95.9] Urinary tract infection with hematuria, site unspecified [N39.0, R31.9] Acute renal failure, unspecified acute renal failure type [facility]) [N17.9] Patient Active Problem List  Diagnosis Date Noted   Septic shock ([place]) 02/28/2022   Pleural effusion, bilateral 10/21/2021   Malnutrition of moderate degree 02/23/2021   UTI (urinary tract infection) 02/22/2021    Altered mental status 02/21/2021   CKD (chronic kidney disease) stage 3, GFR 30-59 ml/min (HCC) 02/21/2021   Dementia ([place]) 11/27/2020   Polio osteopathy of lower leg, left ([place]) 11/27/2020   Subphrenic abscess ([place]) 03/19/2020   Failure to thrive (child) 03/19/2020   Posterior vitreous detachment of both eyes 12/11/2019   Syncope 10/25/2014   Acute renal failure ([place]) 10/25/2014   H/O: CVA (cerebrovascular accident) 10/25/2014   H/O poliomyelitis 10/25/2014   Type 2 diabetes mellitus ([place]) 10/25/2014   Tachycardia 08/29/2013   Cough 08/29/2013   Monoparesis of leg ([place]) 08/20/2013   Muscle weakness of lower extremity 08/20/2013   Right upper quadrant abdominal abscess ([place]) 08/15/2013   Hepatic abscess 08/15/2013   Subacute delirium 08/14/2013   Hyperglycemia 08/14/2013   Oral thrush 08/14/2013   Leukocytosis, unspecified 08/14/2013   Debility 08/14/2013   S/P cholecystectomy 07/29/2013   Cholecystitis with cholelithiasis 07/25/2013   Arthritis 06/06/2013   Headache(784.0) 06/06/2013   Headache 06/06/2013   Generalized weakness 05/24/2013   Cholecystitis, acute 05/24/2013   RUQ abdominal pain 05/13/2013   Acute cholecystitis: Probable 05/13/2013   Constipation 05/13/2013   Enterococcus UTI 05/13/2013   Leukocytosis 05/13/2013   Anemia 05/13/2013   Acute left thalamic CVA (cerebral infarction) 04/10/2012   Hypertension    BPH (benign prostatic hyperplasia)    Pruritic disorder 02/02/2011   PCP:  [name], MD Pharmacy:   [id number] [id number]: [phone]: [phone]    Social Determinants of Health (SDOH) Interventions    Readmission Risk Interventions   No data to display

## 2022-03-04 DIAGNOSIS — N179 Acute kidney failure, unspecified: Secondary | ICD-10-CM | POA: Diagnosis not present

## 2022-03-04 DIAGNOSIS — R6521 Severe sepsis with septic shock: Secondary | ICD-10-CM | POA: Diagnosis not present

## 2022-03-04 DIAGNOSIS — K922 Gastrointestinal hemorrhage, unspecified: Secondary | ICD-10-CM | POA: Diagnosis not present

## 2022-03-04 DIAGNOSIS — A419 Sepsis, unspecified organism: Secondary | ICD-10-CM | POA: Diagnosis not present

## 2022-03-04 LAB — GLUCOSE, CAPILLARY
Glucose-Capillary: 112 mg/dL — ABNORMAL HIGH (ref 70–99)
Glucose-Capillary: 113 mg/dL — ABNORMAL HIGH (ref 70–99)
Glucose-Capillary: 116 mg/dL — ABNORMAL HIGH (ref 70–99)
Glucose-Capillary: 125 mg/dL — ABNORMAL HIGH (ref 70–99)
Glucose-Capillary: 147 mg/dL — ABNORMAL HIGH (ref 70–99)

## 2022-03-04 LAB — BASIC METABOLIC PANEL
Anion gap: 3 — ABNORMAL LOW (ref 5–15)
BUN: 65 mg/dL — ABNORMAL HIGH (ref 8–23)
CO2: 21 mmol/L — ABNORMAL LOW (ref 22–32)
Calcium: 8.2 mg/dL — ABNORMAL LOW (ref 8.9–10.3)
Chloride: 124 mmol/L — ABNORMAL HIGH (ref 98–111)
Creatinine, Ser: 1.29 mg/dL — ABNORMAL HIGH (ref 0.61–1.24)
GFR, Estimated: 55 mL/min — ABNORMAL LOW (ref >60)
Glucose, Bld: 132 mg/dL — ABNORMAL HIGH (ref 70–99)
Potassium: 4 mmol/L (ref 3.5–5.1)
Sodium: 148 mmol/L — ABNORMAL HIGH (ref 135–145)

## 2022-03-04 LAB — CBC
HCT: 23.6 % — ABNORMAL LOW (ref 39.0–52.0)
Hemoglobin: 7.9 g/dL — ABNORMAL LOW (ref 13.0–17.0)
MCH: 32 pg (ref 26.0–34.0)
MCHC: 33.5 g/dL (ref 30.0–36.0)
MCV: 95.5 fL (ref 80.0–100.0)
Platelets: 123 10*3/uL — ABNORMAL LOW (ref 150–400)
RBC: 2.47 MIL/uL — ABNORMAL LOW (ref 4.22–5.81)
RDW: 15.3 % (ref 11.5–15.5)
WBC: 8.2 10*3/uL (ref 4.0–10.5)
nRBC: 0.2 % (ref 0.0–0.2)

## 2022-03-04 MED ORDER — PANTOPRAZOLE SODIUM 40 MG PO TBEC
40.0000 mg | DELAYED_RELEASE_TABLET | Freq: Two times a day (BID) | ORAL | Status: DC
  Administered 2022-03-04 – 2022-03-09 (×11): 40 mg via ORAL
  Filled 2022-03-04 (×11): qty 1

## 2022-03-04 MED ORDER — MEMANTINE HCL 10 MG PO TABS
10.0000 mg | ORAL_TABLET | Freq: Every day | ORAL | Status: DC
  Administered 2022-03-04 – 2022-03-09 (×6): 10 mg via ORAL
  Filled 2022-03-04 (×6): qty 1

## 2022-03-04 MED ORDER — HYDROCODONE-ACETAMINOPHEN 5-325 MG PO TABS
1.0000 | ORAL_TABLET | ORAL | Status: DC | PRN
  Administered 2022-03-05 – 2022-03-07 (×3): 1 via ORAL
  Filled 2022-03-04 (×4): qty 1

## 2022-03-04 NOTE — Evaluation (Signed)
Physical Therapy Evaluation Patient Details Name: Mike Mack MRN: [id number] DOB: [birth year] Today's Date: 03/04/2022  History of Present Illness  Mike Mack is a 86 y.o. M with PMH of Polio and CVA with residual R-sided weakness, HTN, CKD, Type 2 DM, SBO , recent right tibial plateau fracture/NWB brought to Ed 02/28/22  after slumping over in [facility] , Found to have UTI and AKI,  hypotensive , developed dark red stools, s/p  upper  GI endo.  Clinical Impression   Patient admitted for above  medical problems.  No fa,il present for  PLOF.  Patient  noted with atrophied and no RLE movement, positioned in ER at hip.  Patient does moan when the right  knee\leg is moved.  Per 'Care everywhere" ortho note from 9/15-NWB on RLE, OK for gentle ROM  R knee, KI is not needed.  Pt admitted with above diagnosis.  Pt currently with functional limitations due to the deficits listed below (see PT Problem List). Pt will benefit from skilled PT to increase their independence and safety with mobility to allow discharge to the venue listed below.            Recommendations for follow up therapy are one component of a multi-disciplinary discharge planning process, led by the attending physician.  Recommendations may be updated based on patient status, additional functional criteria and insurance authorization.  Follow Up Recommendations Skilled nursing-short term rehab (<3 hours/day) Can patient physically be transported by private vehicle: No    Assistance Recommended at Discharge Frequent or constant Supervision/Assistance  Patient can return home with the following  A lot of help with walking and/or transfers;Two people to help with bathing/dressing/bathroom;Assistance with feeding;Help with stairs or ramp for entrance;Assist for transportation    Equipment Recommendations None recommended by PT  Recommendations for Other Services       Functional Status Assessment Patient has had a recent decline  in their functional status and demonstrates the ability to make significant improvements in function in a reasonable and predictable amount of time.     Precautions / Restrictions Precautions Precautions: Fall Precaution Comments: RLE paralysis, Restrictions Weight Bearing Restrictions: Yes RLE Weight Bearing: Non weight bearing Other Position/Activity Restrictions: per ortho 'care everywhere note: NWB, ok for gentle ROM,  does not require KI.      Mobility  Bed Mobility Overal bed mobility: Needs Assistance Bed Mobility: Rolling Rolling: Total assist, +2 for physical assistance, +2 for safety/equipment         General bed mobility comments: rolling to each required total assistance.    Transfers                   General transfer comment: deferred  attempts with only 1  therapist availble.    Ambulation/Gait                  Stairs            Wheelchair Mobility    Modified Rankin (Stroke Patients Only)       Balance                                             Pertinent Vitals/Pain Pain Assessment Breathing: occasional labored breathing, short period of hyperventilation Negative Vocalization: occasional moan/groan, low speech, negative/disapproving quality Facial Expression: sad, frightened, frown Body Language: tense, distressed pacing, fidgeting Consolability: no need to  console PAINAD Score: 4 Pain Location: right leg, repositioned Pain Descriptors / Indicators: Grimacing, Guarding, Moaning Pain Intervention(s): Monitored during session, Repositioned    Home Living Family/patient expects to be discharged to:: Private residence Living Arrangements: Spouse/significant other Available Help at Discharge: Family;Available 24 hours/day Type of Home: House           Home Equipment: Wheelchair - manual Additional Comments: no family present for info ,    Prior Function               Mobility Comments:  unsure, appears WC dependent ADLs Comments: unsure     Hand Dominance        Extremity/Trunk Assessment   Upper Extremity Assessment Upper Extremity Assessment: Defer to OT evaluation    Lower Extremity Assessment Lower Extremity Assessment: RLE deficits/detail;LLE deficits/detail RLE Deficits / Details: flaccid, positions externally rotated at the hip, repositioned on a  pillow and  Allevyn pad placed on heel, lacks full extension at the knee, noted atrophy of the leg LLE Deficits / Details: patient  minimally moves the leg, noted muscle mass  WNL.       Communication      Cognition Arousal/Alertness: Awake/alert Behavior During Therapy: Flat affect Overall Cognitive Status: No family/caregiver present to determine baseline cognitive functioning Area of Impairment: Orientation, Attention, Following commands, Awareness                 Orientation Level: Place, Time, Situation Current Attention Level: Focused   Following Commands: Follows one step commands inconsistently   Awareness: Intellectual   General Comments: patient answered question of his name, not oriented otherwise, stated he was in  the bed.        General Comments General comments (skin integrity, edema, etc.): NT    Exercises     Assessment/Plan    PT Assessment Patient needs continued PT services  PT Problem List Decreased strength;Impaired tone;Decreased range of motion;Decreased knowledge of precautions;Decreased safety awareness;Decreased activity tolerance;Decreased cognition;Decreased balance;Pain;Impaired sensation       PT Treatment Interventions Therapeutic activities;Cognitive remediation;Therapeutic exercise;Patient/family education;Functional mobility training;Balance training    PT Goals (Current goals can be found in the Care Plan section)  Acute Rehab PT Goals PT Goal Formulation: Patient unable to participate in goal setting Time For Goal Achievement: 03/18/22 Potential to  Achieve Goals: Good    Frequency Min 2X/week     Co-evaluation               AM-PAC PT "6 Clicks" Mobility  Outcome Measure Help needed turning from your back to your side while in a flat bed without using bedrails?: Total Help needed moving from lying on your back to sitting on the side of a flat bed without using bedrails?: Total Help needed moving to and from a bed to a chair (including a wheelchair)?: Total Help needed standing up from a chair using your arms (e.g., wheelchair or bedside chair)?: Total Help needed to walk in hospital room?: Total Help needed climbing 3-5 steps with a railing? : Total 6 Click Score: 6    End of Session   Activity Tolerance: Patient limited by fatigue;Patient limited by pain Patient left: in bed;with call bell/phone within reach;with bed alarm set Nurse Communication: Mobility status;Need for lift equipment PT Visit Diagnosis: Other symptoms and signs involving the nervous system (R29.898);History of falling (Z91.81)    Time: 1530-1600 PT Time Calculation (min) (ACUTE ONLY): 30 min   Charges:   PT Evaluation $PT Eval Low Complexity: 1  Low PT Treatments $Therapeutic Activity: 8-22 mins        [place] Office [phone] Weekend [phone]   Claretha Cooper 03/04/2022, 4:17 PM

## 2022-03-04 NOTE — Progress Notes (Signed)
Speech Language Pathology Treatment: Dysphagia  Patient Details Name: Mike Mack MRN: [id number] DOB: 01-14-1936 Today's Date: 03/04/2022 Time: 1191-4782 SLP Time Calculation (min) (ACUTE ONLY): 45 min  Assessment / Plan / Recommendation Clinical Impression  Pt seen for skilled SLP to determine readiness for po intake.  Pt awake with eyes opened at beginning of session. Oral care provided with suction and dentures placed.  Using hand over hand to facilitate neuro input for swallowing, pt was provided with water, applejuice, Ensure, applesauce and icecream.  Prolonged oral holding evident with pt finally requiring oral suction to clear applesauce and icecream that he did not swallow after over 40 seconds.  Delayed multiple swallows observed with liquids - up to 25 seconds - with pt needing cues to swallow.  He reports improvement of discomfort with swallow and does not wince or indicate pain.  Suspect his current dysphagia is multifactorial including impact of cognition, prior CVA and odynophagia from esophagitis.   Of note, pt was awake - as he would look to SLP when moved in room to computer - he did become sleepier as session progressed.   Currently recommend pt be given liquids from RN ONLY for safety.  Wife arrived and SLP reviewed findings/recommendations with her.  Mike Mack reports that Mike Mack would wince and appear with effort with swallowing PTA= thus indicative of already having improvement.  Hopeful for continued improvement with resolution of esophagitis and improved mentation as nutrition adequacy will remain a concern if s/s do not abate.    Reviewed goals with pt and wife to aid in transiting to po diet including and posted in the room: RN informed - All in agreement  Mentation - responding to communication Initiation of swallow in timely fashion Stronger Voice      HPI HPI: Mike Mack is a 86 y.o. M with PMH of Polio and CVA with residual R-sided weakness, HTN, CKD,  Type 2 DM, SBO who was brought in after he slumped over in his wheelchair on the way to the an appointment today.  His wife is at the bedside and states that he has had throat pain with swallowing for approximately three weeks and subsequently very little po intake.  She denies fevers, chills, aspiration event, nausea or vomiting.   He had a recent fall with tib/fib fx on the R. Found to have UTI and AKI. HIstory of stomach ulcers.      SLP Plan  Continue with current plan of care      Recommendations for follow up therapy are one component of a multi-disciplinary discharge planning process, led by the attending physician.  Recommendations may be updated based on patient status, additional functional criteria and insurance authorization.    Recommendations  Diet recommendations: NPO (floor stock of liquids given by RN only) Liquids provided via: Straw Medication Administration: Via alternative means (suspension or crushed if not contraindicated) Compensations: Other (Comment) (oral suction if pt does not elicit a swallow) Postural Changes and/or Swallow Maneuvers: Seated upright 90 degrees;Upright 30-60 min after meal (observe voice box elevate to assure he swallows)                Oral Care Recommendations: Oral care BID;Oral care before and after PO Follow Up Recommendations: Skilled nursing-short term rehab (<3 hours/day) SLP Visit Diagnosis: Dysphagia, oral phase (R13.11) Plan: Continue with current plan of care         Mike Lime, MS [place] Office [phone] Pager [phone]   Mike Mack  Ann  03/04/2022, 10:14 AM

## 2022-03-04 NOTE — Progress Notes (Addendum)
NAME:  Mike Mack, MRN:  [id number], DOB:  [birth year], LOS: 4 ADMISSION DATE:  02/28/2022, CONSULTATION DATE: 02/28/2022 REFERRING MD: Dr. Jeanell Sparrow, CHIEF COMPLAINT: Weakness  History of Present Illness:  Mike Mack is a 86 y.o. M with PMH of Polio and CVA with residual R-sided weakness, HTN, CKD, Type 2 DM, SBO who was brought in after he slumped over in his wheelchair on the way to the an appointment today.  His wife is at the bedside and states that he has had throat pain with swallowing for approximately three weeks and subsequently very little po intake.  She denies fevers, chills, aspiration event, nausea or vomiting.   He had a recent fall with tib/fib fx on the R.    On presentation to the ED he was hypotensive and hypothermic, was given 30cc/kg IVF.  UA consistent with hematuria and UTI, CTH negative, CXR without acute process.  Labs significant for AKI with creatinine 5.4, K 5.4, WBC 9, lactic acid 1->2.1.  He was given vancomycin and cefepime and started on Levophed. PCCM consulted for admission  Pertinent  Medical History  Anemia  Arthritis BPH  Constipation  History of kidney stones History of stomach ulcers Hypertension Neuropathy Polio (Childhood) Small bowel obstruction  Stroke  Type II diabetes mellitus   Significant Hospital Events: Including procedures, antibiotic start and stop dates in addition to other pertinent events   9/12-presented with generalized weakness, UTI and AKI, PCCM admitted 9/14 RN reports 2 bloody BMs overnight with 2 point drop in hemoglobin this am.  9/15 two dark colored stools overnight, received one unit PRBC overnight with hgb bump to 8.8 9/15 EGD -esophagitis, gastric  bulb ulcers and erosions without active bleeding  Interim History / Subjective:    Off Levophed more than several hours. Good urine output. Complained of stomach pain overnight but denies at this time. Afebrile  Objective   Blood pressure (!) 178/81, pulse (!) 102,  temperature 98.2 F (36.8 C), resp. rate (!) 21, height '5\' 5"'$  (1.651 m), weight 74.5 kg, SpO2 97 %.        Intake/Output Summary (Last 24 hours) at 03/04/2022 0847 Last data filed at 03/04/2022 0700 Gross per 24 hour  Intake 392.1 ml  Output 1450 ml  Net -1057.9 ml    Filed Weights   03/03/22 0500 03/03/22 1202 03/04/22 0500  Weight: 74.5 kg 74.5 kg 74.5 kg    Examination: General: Acute on chronically ill appearing elderly male lying in bed in NAD HEENT: [place]/AT, MM pink/moist, PERRL,  Neuro: Able to state name and interacts, grossly nonfocal CV: s1s2 regular rate and rhythm, no murmur, rubs, or gallops,  PULM: Clear breath sounds bilateral, no accessory muscle use GI: soft, bowel sounds active in all 4 quadrants, non-tender, slightly distended Extremities: warm/dry, no edema  Skin: no rashes or lesions  Labs show mild hypernatremia, hyperchloremia, decreasing BUN and creatinine, no leukocytosis, stable anemia and worsening thrombocytopenia  Resolved Hospital Problem list     Assessment & Plan:  Septic shock secondary to UTI -Culture negative, presumed UTI, renal ultrasound does not show hydronephrosis -Pressors off , plan for 5 days of IV Cefepime    GI bleeding -RN reports 2 bloody BMs overnight 9/14 with 2 point drop in HGB. -EGD June 2022 with both gastric and duodenal ulcers were seen  Repeat EGD shows esophagitis and gastric ulcers and erosions without active bleeding P: Continue BID PPI Trend CBC   if abdominal plain or bleeding, consider CT abdomen for  ischemic colitis Hgb goal > 7  Acute on chronic stage IIIb renal failure, resolved -Creatinine on admit 5.44 with GFR 10, creatinine March 2023 1.80 with GFR 37 P: Monitor urine output Trend Bmet Avoid nephrotoxins DC IV fluids   Type 2 diabetes -Home medications include Tresiba and Actos  P: Continue SSI  CBG q4hrs  CBG goal 140-180  Sore throat -Evaluated by SLP 9/13 with large pocket of food  observed in right cheek with decreased mentation P: Advance after swallow evaluation noted mental status improved  Hypothyroidism P: Continue home synthroid   Right tibial fracture - PT eval  -may need to clarify from Ortho whether he can weight-bear or not  Deconditioning  Protein calorie  malnutrition   P: PT/OT evals  Mobilize as able  Can transfer to tele and to [place] (right click and "Reselect all SmartList Selections" daily)   Diet/type: NPO DVT prophylaxis: prophylactic heparin  GI prophylaxis: N/A Lines: Central line Foley:  Yes, and it is still needed Code Status:  full code Last date of multidisciplinary goals of care discussion:  Updated wife at bedside    Kara Mead MD. FCCP. [facility] Pager : [phone]  If no response to pager , please call [phone] until 7 pm After 7:00 pm call Elink  [phone]    03/04/2022, 8:47 AM

## 2022-03-04 NOTE — Progress Notes (Signed)
Mike Mack 8:01 AM  Subjective: Patient doing better overall and case discussed with his nurse and right now he is not complaining of abdominal pain and has no obvious new complaints  Objective: Vital signs stable afebrile off his pressors abdomen is soft nontender for me today BUN and creatinine continue to decrease hemoglobin continues to drop with rehydration as well white count okay  Assessment: Multiple medical problems significant esophagitis and gastric and bulb ulcers and erosions without signs of active bleeding  Plan: If belly pain continues to be an issue consider a CT scan and would not surprise me if ischemic changes are seen otherwise slowly advance diet per ICU team and swallowing team and will await biopsies and please call us if we could be of any further assistance this weekend otherwise we will ask rounding team to check on on Monday  [facility]  office [phone] After 5PM or if no answer call [phone]

## 2022-03-05 ENCOUNTER — Encounter (HOSPITAL_COMMUNITY): Payer: Self-pay | Admitting: Gastroenterology

## 2022-03-05 DIAGNOSIS — R6521 Severe sepsis with septic shock: Secondary | ICD-10-CM | POA: Diagnosis not present

## 2022-03-05 DIAGNOSIS — A419 Sepsis, unspecified organism: Secondary | ICD-10-CM | POA: Diagnosis not present

## 2022-03-05 LAB — BASIC METABOLIC PANEL
Anion gap: 4 — ABNORMAL LOW (ref 5–15)
BUN: 43 mg/dL — ABNORMAL HIGH (ref 8–23)
CO2: 20 mmol/L — ABNORMAL LOW (ref 22–32)
Calcium: 8.7 mg/dL — ABNORMAL LOW (ref 8.9–10.3)
Chloride: 127 mmol/L — ABNORMAL HIGH (ref 98–111)
Creatinine, Ser: 1.25 mg/dL — ABNORMAL HIGH (ref 0.61–1.24)
GFR, Estimated: 57 mL/min — ABNORMAL LOW (ref >60)
Glucose, Bld: 125 mg/dL — ABNORMAL HIGH (ref 70–99)
Potassium: 4 mmol/L (ref 3.5–5.1)
Sodium: 151 mmol/L — ABNORMAL HIGH (ref 135–145)

## 2022-03-05 LAB — CBC WITH DIFFERENTIAL/PLATELET
Abs Immature Granulocytes: 0.13 10*3/uL — ABNORMAL HIGH (ref 0.00–0.07)
Basophils Absolute: 0 10*3/uL (ref 0.0–0.1)
Basophils Relative: 0 %
Eosinophils Absolute: 0.1 10*3/uL (ref 0.0–0.5)
Eosinophils Relative: 2 %
HCT: 24.1 % — ABNORMAL LOW (ref 39.0–52.0)
Hemoglobin: 8 g/dL — ABNORMAL LOW (ref 13.0–17.0)
Immature Granulocytes: 2 %
Lymphocytes Relative: 23 %
Lymphs Abs: 1.9 10*3/uL (ref 0.7–4.0)
MCH: 31.9 pg (ref 26.0–34.0)
MCHC: 33.2 g/dL (ref 30.0–36.0)
MCV: 96 fL (ref 80.0–100.0)
Monocytes Absolute: 0.6 10*3/uL (ref 0.1–1.0)
Monocytes Relative: 7 %
Neutro Abs: 5.6 10*3/uL (ref 1.7–7.7)
Neutrophils Relative %: 66 %
Platelets: 129 10*3/uL — ABNORMAL LOW (ref 150–400)
RBC: 2.51 MIL/uL — ABNORMAL LOW (ref 4.22–5.81)
RDW: 15.3 % (ref 11.5–15.5)
WBC: 8.4 10*3/uL (ref 4.0–10.5)
nRBC: 0.4 % — ABNORMAL HIGH (ref 0.0–0.2)

## 2022-03-05 LAB — GLUCOSE, CAPILLARY
Glucose-Capillary: 113 mg/dL — ABNORMAL HIGH (ref 70–99)
Glucose-Capillary: 121 mg/dL — ABNORMAL HIGH (ref 70–99)
Glucose-Capillary: 121 mg/dL — ABNORMAL HIGH (ref 70–99)
Glucose-Capillary: 124 mg/dL — ABNORMAL HIGH (ref 70–99)
Glucose-Capillary: 132 mg/dL — ABNORMAL HIGH (ref 70–99)
Glucose-Capillary: 161 mg/dL — ABNORMAL HIGH (ref 70–99)

## 2022-03-05 LAB — CULTURE, BLOOD (ROUTINE X 2)
Culture: NO GROWTH
Culture: NO GROWTH
Special Requests: ADEQUATE

## 2022-03-05 LAB — MAGNESIUM: Magnesium: 1.8 mg/dL (ref 1.7–2.4)

## 2022-03-05 LAB — PHOSPHORUS: Phosphorus: 1.7 mg/dL — ABNORMAL LOW (ref 2.5–4.6)

## 2022-03-05 MED ORDER — K PHOS MONO-SOD PHOS DI & MONO 155-852-130 MG PO TABS
250.0000 mg | ORAL_TABLET | Freq: Three times a day (TID) | ORAL | Status: AC
  Administered 2022-03-05 – 2022-03-06 (×6): 250 mg via ORAL
  Filled 2022-03-05 (×6): qty 1

## 2022-03-05 NOTE — Progress Notes (Signed)
Arn Medal 11:55 AM  Subjective: Patient seen and examined and case discussed with the patient's wife as well as his nurse and he has not been swallowing well but supposedly physically he is back to close to his baseline and his belly is no longer hurting and he has no other complaints no obvious signs of bleeding  Objective: Vital signs stable afebrile no acute distress abdomen is soft nontender BUN and creatinine continue to drop with hydration hemoglobin stable  Assessment: Multiple medical problems  Plan: Will need swallowing team to see on Monday and we briefly discussed a possible feeding tube versus special diets and will wait on biopsies and please let us know if we can be of any specific help with the remainder of his hospital stay [facility]  office [phone] After 5PM or if no answer call [phone]

## 2022-03-05 NOTE — Progress Notes (Signed)
OT Cancellation Note  Patient Details Name: Gurjot Brisco MRN: [id number] DOB: 1935/12/01   Cancelled Treatment:    Reason Eval/Treat Not Completed: Other (comment) Nurse reporting patient had a bad morning thus far. Patient noted to be resting at this time. OT to continue to follow and check back as schedule will allow.  Rennie Plowman, [place] [facility] Office# [phone]  03/05/2022, 8:18 AM

## 2022-03-05 NOTE — Progress Notes (Signed)
Patient requiring several ques when swallowing. Very slow with swallowing.Wife states pt has had increasing hard time with swallowing over several months. Takes liquids much easier with no coughing.Eulas Post, RN

## 2022-03-05 NOTE — Progress Notes (Signed)
Speech Language Pathology Treatment: Dysphagia  Patient Details Name: Mike Mack MRN: [id number] DOB: [birth year] Today's Date: 03/05/2022 Time: 6503-5465 SLP Time Calculation (min) (ACUTE ONLY): 15 min  Assessment / Plan / Recommendation Clinical Impression  Mr. Danis continues with a cognitive-based oral dysphagia. He continues with oral holding of purees, but is able to initiate posterior transit with liquid assist. Pt was able to consume half a pack of jello and a few bites of applesauce as long as SLP was immediately providing water via straw for cues to swallow. Pt was alert and inquired, "you don't think I'm swallowing okay?" He was not observed to be holding liquids in mouth. He did appear to benefit from increased stimulation (very cold water, mildly pressing both spoon and straw into tongue to give input, strong cherry-flavored jello). He was noted with prolonged mastication/manipulation of jello when not provided the liquid wash cue. At this time, recommend pureed solids, thin liquids via straw, 1:1 A for all intake with constant cues and alternating solids/liquids as a tactile cue. Feeder should check mouth for residue prior to leaving room after meals. SLP service to follow for diet upgrade as able.    HPI HPI: Mike Mack is a 86 y.o. M with PMH of Polio and CVA with residual R-sided weakness, HTN, CKD, Type 2 DM, SBO who was brought in after he slumped over in his wheelchair on the way to the an appointment today.  His wife is at the bedside and states that he has had throat pain with swallowing for approximately three weeks and subsequently very little po intake.  She denies fevers, chills, aspiration event, nausea or vomiting.   He had a recent fall with tib/fib fx on the R. Found to have UTI and AKI. HIstory of stomach ulcers.      SLP Plan  Continue with current plan of care      Recommendations for follow up therapy are one component of a multi-disciplinary discharge  planning process, led by the attending physician.  Recommendations may be updated based on patient status, additional functional criteria and insurance authorization.    Recommendations  Diet recommendations: Thin liquid;Dysphagia 1 (puree) Liquids provided via: Straw Medication Administration: Whole meds with puree Supervision: Staff to assist with self feeding;Full supervision/cueing for compensatory strategies Compensations: Follow solids with liquid                Oral Care Recommendations: Oral care BID Follow Up Recommendations: Follow physician's recommendations for discharge plan and follow up therapies Assistance recommended at discharge: Intermittent Supervision/Assistance SLP Visit Diagnosis: Dysphagia, oral phase (R13.11) Plan: Continue with current plan of care         Tywanna Seifer P. Eugene Zeiders, M.S., CCC-SLP Speech-Language Pathologist Acute Rehabilitation Services Pager: [phone]  03/05/2022, 3:49 PM

## 2022-03-05 NOTE — Progress Notes (Signed)
Patient removed primafit catheter and was incontinent of urine. He moaned when urinating. Foreskin retracted to assess and swelling was found around head of penis. RN unable to retract foreskin due to swelling. Bladder scan performed with 45 ml shown in bladder. Patient experienced significant pain in penis. Md notified and examined penis. Primafit not replaced. Continue to monitor swelling and urinary output.Eulas Post, RN

## 2022-03-05 NOTE — Progress Notes (Signed)
PROGRESS NOTE    Zigmund Linse  AST:[id number] DOB: [birth year] DOA: 02/28/2022 PCP: [name], MD   Brief Narrative:  This 86 years old male with PMH significant for polio and CVA with residual right-sided weakness, hypertension, CKD stage IIIa, type 2 diabetes, history of small bowel obstruction who was brought in after he slumped over in his wheelchair on the way to his appointment.  His wife at bedside stated that he had throat pain while swallowing for approximately 3 weeks and subsequently has little p.o. intake.  She denies any fever,  chills,  aspiration events.  Patient had a recent fall with a right tibia and fibula fracture and was advised nonweightbearing. Patient was hypotensive and hypothermic , UA consistent with UTI. CT head negative. Chest x-ray without acute process.  Labs consistent with acute kidney injury with serum creatinine 5.4.  Patient was admitted in the ICU and started on IV vancomycin and cefepime,  also required Levophed support.  Now off pressors.  Patient had dark-colored stools, hemoglobin dropped and has required blood transfusion.  GI was consulted underwent EGD found to have gastric bulb ulcers and erosions without active bleeding.  PCCM pickup 9/17.  Assessment & Plan:   Principal Problem:   Septic shock ([place]) Active Problems:   AKI (acute kidney injury) ([place])  Septic shock secondary to UTI: Patient was found to have hypotension, tachycardia, tachypnea, requiring Levophed support. Presumed UTI, renal ultrasound does not show hydronephrosis Levophed subsequently discontinued.  Maintaining blood pressure Continue IV cefepime for 5 days.  Urine/ Blood cultures negative  GI bleeding: Patient had 2 bloody bowel movements on 9/14 with a drop of hemoglobin by 2 points EGD done shows gastric and duodenal ulcers and erosion without active bleeding. S/p 1 unit PRBC.  Continue pantoprazole 40 mg twice daily. Continue to trend hemoglobin. Hb stable  around 8.0 If abdominal pain or bleeding recurs, Consider CT abdomen for ischemic colitis. Keep hemoglobin above 7.  Acute on chronic stage IIIb renal failure: Baseline serum creatinine 1.8. Presented with creatinine of 5.44. Avoid nephrotoxic medications,  continued on IV hydration.   Renal functions has improved.  AKI has resolved.  Type 2 diabetes Continue home medication  Tyler Aas and Actos  Hypothyroidism: Continue Synthroid  Right tibial fracture: May need to clarify with Ortho whether she can weight-bear or not  Sore throat: Patient was evaluated by speech and swallow with large pocket of food observed in the right cheek with decreased mentation Advance diet after swallow evaluation mental status has improved now  Severe deconditioning  Protein calorie malnutrition Continue feeds as per dietitian PT and OT evaluation Ambulate as possible.  Hypernatremia: Could be due to IV fluids.  Discontinue IV fluids. Continue to monitor labs.  Hypophosphatemia: Replaced.  Continue to monitor  DVT prophylaxis: Heparin Code Status: Full code Family Communication: Wife at bedside Disposition Plan:   Status is: Inpatient Remains inpatient appropriate because: Admitted for septic shock secondary to presumed UTI requiring IV antibiotics.  Patient had a GI bleed underwent EGD found to have nonbleeding ulcers H&H is remained stable.  Anticipated discharge home in 1 to 2 days.    Consultants:  Gastroenterology, PCCM  Procedures: EGD Antimicrobials: Cefepime  Subjective: Patient was seen and examined at bedside.  Overnight events noted.   Patient reports feeling better , reports weak and tired, reports pain in the right leg from recent fall and fracture.   Objective: Vitals:   03/04/22 2011 03/05/22 0158 03/05/22 0500 03/05/22 0647  BP: Marland Kitchen)  142/65 (!) 150/67  (!) 147/64  Pulse: 95 94  94  Resp: 18     Temp: 98.4 F (36.9 C) 98.1 F (36.7 C)  98.6 F (37 C)  TempSrc:  Oral Oral  Oral  SpO2: 100% 100%  100%  Weight:   81.7 kg   Height:        Intake/Output Summary (Last 24 hours) at 03/05/2022 1215 Last data filed at 03/05/2022 0202 Gross per 24 hour  Intake 60 ml  Output 500 ml  Net -440 ml   Filed Weights   03/04/22 0500 03/04/22 1345 03/05/22 0500  Weight: 74.5 kg 81.7 kg 81.7 kg    Examination:  General exam: Appears comfortable, not in any acute distress, deconditioned Respiratory system: CTA bilaterally, respiratory effort normal, RR 15 Cardiovascular system: S1 & S2 heard, regular rate and rhythm, no murmur. Gastrointestinal system: Abdomen is soft, non tender, non distended, BS+ Central nervous system: Alert and oriented x 2 . No focal neurological deficits. Extremities: Mild tenderness noted in the right lower extremity. Skin: No rashes, lesions or ulcers Psychiatry: Judgement and insight appear normal. Mood & affect appropriate.     Data Reviewed: I have personally reviewed following labs and imaging studies  CBC: Recent Labs  Lab 02/28/22 1022 02/28/22 1112 03/01/22 0415 03/02/22 0730 03/02/22 1332 03/03/22 0157 03/04/22 0356 03/05/22 0526  WBC 9.0   < > 9.5 9.5  --  8.5 8.2 8.4  NEUTROABS 7.3  --   --   --   --   --   --  5.6  HGB 12.1*   < > 10.9* 8.2* 7.1* 8.8* 7.9* 8.0*  HCT 36.6*   < > 32.4* 24.5* 21.5* 25.6* 23.6* 24.1*  MCV 98.1   < > 94.5 96.5  --  91.1 95.5 96.0  PLT 248   < > 226 183  --  151 123* 129*   < > = values in this interval not displayed.   Basic Metabolic Panel: Recent Labs  Lab 02/28/22 1513 02/28/22 1720 03/01/22 0415 03/02/22 0730 03/03/22 0157 03/04/22 0356 03/05/22 0526  NA  --    < > 135 141 143 148* 151*  K  --    < > 4.3 4.1 4.1 4.0 4.0  CL  --    < > 108 119* 121* 124* 127*  CO2  --    < > 17* 19* 20* 21* 20*  GLUCOSE  --    < > 254* 221* 169* 132* 125*  BUN  --    < > 130* 110* 94* 65* 43*  CREATININE  --    < > 3.34* 1.82* 1.44* 1.29* 1.25*  CALCIUM  --    < > 8.1* 7.8*  8.1* 8.2* 8.7*  MG 2.5*  --  2.3  --   --   --  1.8  PHOS 4.5  --   --   --   --   --  1.7*   < > = values in this interval not displayed.   GFR: Estimated Creatinine Clearance: 44.1 mL/min (A) (by C-G formula based on SCr of 1.25 mg/dL (H)). Liver Function Tests: Recent Labs  Lab 02/28/22 1022 02/28/22 1720  AST 11* 12*  ALT 16 14  ALKPHOS 96 79  BILITOT 1.0 1.0  PROT 6.6 5.3*  ALBUMIN 2.4* 2.0*   No results for input(s): "LIPASE", "AMYLASE" in the last 168 hours. No results for input(s): "AMMONIA" in the last 168 hours. Coagulation Profile: Recent  Labs  Lab 02/28/22 1022  INR 1.4*   Cardiac Enzymes: No results for input(s): "CKTOTAL", "CKMB", "CKMBINDEX", "TROPONINI" in the last 168 hours. BNP (last 3 results) No results for input(s): "PROBNP" in the last 8760 hours. HbA1C: No results for input(s): "HGBA1C" in the last 72 hours. CBG: Recent Labs  Lab 03/04/22 1740 03/04/22 2013 03/04/22 2352 03/05/22 0404 03/05/22 0730  GLUCAP 116* 125* 112* 113* 121*   Lipid Profile: No results for input(s): "CHOL", "HDL", "LDLCALC", "TRIG", "CHOLHDL", "LDLDIRECT" in the last 72 hours. Thyroid Function Tests: No results for input(s): "TSH", "T4TOTAL", "FREET4", "T3FREE", "THYROIDAB" in the last 72 hours. Anemia Panel: No results for input(s): "VITAMINB12", "FOLATE", "FERRITIN", "TIBC", "IRON", "RETICCTPCT" in the last 72 hours. Sepsis Labs: Recent Labs  Lab 02/28/22 1231 02/28/22 1408 02/28/22 1513 02/28/22 1720  LATICACIDVEN 2.1* 1.2 1.0 0.7    Recent Results (from the past 240 hour(s))  Urine Culture     Status: None   Collection Time: 02/28/22 10:31 AM   Specimen: In/Out Cath Urine  Result Value Ref Range Status   Specimen Description   Final    IN/OUT CATH URINE Performed at [facility], [place] [address] [place], [place] [address]    Special Requests   Final    NONE Performed at [facility], [place] [address]  [place], [place] [address]    Culture   Final    NO GROWTH Performed at [facility], [place] [address], [place], [place] [address]    Report Status 03/01/2022 FINAL  Final  Blood Culture (routine x 2)     Status: None   Collection Time: 02/28/22 10:56 AM   Specimen: BLOOD  Result Value Ref Range Status   Specimen Description   Final    BLOOD BLOOD RIGHT ARM Performed at [place] [address] [place], [place] [address]    Special Requests   Final    BOTTLES DRAWN AEROBIC AND ANAEROBIC Blood Culture adequate volume Performed at [place] [address], [place], [place] [address]    Culture   Final    NO GROWTH 5 DAYS Performed at [facility], [place] [address] [place], [place] [address]    Report Status 03/05/2022 FINAL  Final  Blood Culture (routine x 2)     Status: None   Collection Time: 02/28/22 11:03 AM   Specimen: BLOOD RIGHT HAND  Result Value Ref Range Status   Specimen Description   Final    BLOOD RIGHT HAND Performed at [place] [address] [place], [place] [address]    Special Requests   Final    BOTTLES DRAWN AEROBIC ONLY Blood Culture results may not be optimal due to an inadequate volume of blood received in culture bottles Performed at [place] [address] [place], [place] [address]    Culture   Final    NO GROWTH 5 DAYS Performed at [facility], [place] [address] [place], [place] [address]    Report Status 03/05/2022 FINAL  Final  MRSA Next Gen by PCR, Nasal     Status: None   Collection Time: 02/28/22  2:52 PM   Specimen: Nasal Mucosa; Nasal Swab  Result Value Ref Range Status   MRSA by PCR Next Gen NOT DETECTED NOT DETECTED Final    Comment: (NOTE) The GeneXpert MRSA Assay (FDA approved for NASAL specimens only), is one component of a comprehensive  MRSA colonization surveillance program. It is not intended to diagnose MRSA infection nor to  guide or monitor treatment for MRSA infections. Test performance is not FDA approved in patients less than 68 years old. Performed at [facility], [place] [address], [place], [place] [address]     Radiology Studies: No results found.  Scheduled Meds:  brimonidine  1 drop Both Eyes TID   dorzolamide-timolol  1 drop Both Eyes BID   insulin aspart  0-9 Units Subcutaneous Q4H   latanoprost  1 drop Both Eyes QHS   levothyroxine  88 mcg Oral QAC breakfast   memantine  10 mg Oral Daily   Netarsudil Dimesylate  1 drop Both Eyes QHS   pantoprazole  40 mg Oral BID   phosphorus  250 mg Oral TID   Continuous Infusions:  sodium chloride       LOS: 5 days    Time spent: 50 mins    Ananda Sitzer, MD [facility]   If 7PM-7AM, please contact night-coverage

## 2022-03-06 DIAGNOSIS — A419 Sepsis, unspecified organism: Secondary | ICD-10-CM | POA: Diagnosis not present

## 2022-03-06 DIAGNOSIS — R6521 Severe sepsis with septic shock: Secondary | ICD-10-CM | POA: Diagnosis not present

## 2022-03-06 LAB — CBC
HCT: 25.9 % — ABNORMAL LOW (ref 39.0–52.0)
Hemoglobin: 8.1 g/dL — ABNORMAL LOW (ref 13.0–17.0)
MCH: 31.2 pg (ref 26.0–34.0)
MCHC: 31.3 g/dL (ref 30.0–36.0)
MCV: 99.6 fL (ref 80.0–100.0)
Platelets: 171 10*3/uL (ref 150–400)
RBC: 2.6 MIL/uL — ABNORMAL LOW (ref 4.22–5.81)
RDW: 15.4 % (ref 11.5–15.5)
WBC: 10.8 10*3/uL — ABNORMAL HIGH (ref 4.0–10.5)
nRBC: 0.2 % (ref 0.0–0.2)

## 2022-03-06 LAB — TYPE AND SCREEN
ABO/RH(D): A POS
Antibody Screen: NEGATIVE
Unit division: 0
Unit division: 0
Unit division: 0

## 2022-03-06 LAB — BPAM RBC
Blood Product Expiration Date: 202310042359
Blood Product Expiration Date: 202310042359
Blood Product Expiration Date: 202310042359
ISSUE DATE / TIME: 202309142116
Unit Type and Rh: 6200
Unit Type and Rh: 6200
Unit Type and Rh: 6200

## 2022-03-06 LAB — BASIC METABOLIC PANEL
Anion gap: 6 (ref 5–15)
BUN: 32 mg/dL — ABNORMAL HIGH (ref 8–23)
CO2: 22 mmol/L (ref 22–32)
Calcium: 8.9 mg/dL (ref 8.9–10.3)
Chloride: 124 mmol/L — ABNORMAL HIGH (ref 98–111)
Creatinine, Ser: 1.2 mg/dL (ref 0.61–1.24)
GFR, Estimated: >60 mL/min (ref >60)
Glucose, Bld: 130 mg/dL — ABNORMAL HIGH (ref 70–99)
Potassium: 3.9 mmol/L (ref 3.5–5.1)
Sodium: 152 mmol/L — ABNORMAL HIGH (ref 135–145)

## 2022-03-06 LAB — GLUCOSE, CAPILLARY
Glucose-Capillary: 106 mg/dL — ABNORMAL HIGH (ref 70–99)
Glucose-Capillary: 112 mg/dL — ABNORMAL HIGH (ref 70–99)
Glucose-Capillary: 149 mg/dL — ABNORMAL HIGH (ref 70–99)
Glucose-Capillary: 145 mg/dL — ABNORMAL HIGH (ref 70–99)
Glucose-Capillary: 204 mg/dL — ABNORMAL HIGH (ref 70–99)

## 2022-03-06 LAB — MAGNESIUM: Magnesium: 1.8 mg/dL (ref 1.7–2.4)

## 2022-03-06 LAB — PHOSPHORUS: Phosphorus: 2.1 mg/dL — ABNORMAL LOW (ref 2.5–4.6)

## 2022-03-06 MED ORDER — CLOTRIMAZOLE-BETAMETHASONE 1-0.05 % EX CREA
TOPICAL_CREAM | Freq: Two times a day (BID) | CUTANEOUS | Status: DC
  Filled 2022-03-06: qty 15

## 2022-03-06 MED ORDER — DEXTROSE-NACL 5-0.45 % IV SOLN: INTRAVENOUS | Status: AC

## 2022-03-06 NOTE — Progress Notes (Signed)
PROGRESS NOTE    Mike Mack  VQM:[id number] DOB: 11/13/1935 DOA: 02/28/2022 PCP: [name], MD   Brief Narrative:  This 86 years old male with PMH significant for polio and CVA with residual right-sided weakness, hypertension, CKD stage IIIa, type 2 diabetes, history of small bowel obstruction who was brought in after he slumped over in his wheelchair on the way to his appointment.  His wife at bedside stated that he had throat pain while swallowing for approximately 3 weeks and subsequently has little p.o. intake.  She denies any fever,  chills,  aspiration events.  Patient had a recent fall with a right tibia and fibula fracture and was advised nonweightbearing. Patient was hypotensive and hypothermic , UA consistent with UTI. CT head negative. Chest x-ray without acute process.  Labs consistent with acute kidney injury with serum creatinine 5.4.  Patient was admitted in the ICU and started on IV vancomycin and cefepime,  also required Levophed support.  Now off pressors.  Patient had dark-colored stools, hemoglobin dropped and has required blood transfusion.  GI was consulted underwent EGD found to have gastric bulb ulcers and erosions without active bleeding.  PCCM pickup 9/17.  Assessment & Plan:   Principal Problem:   Septic shock ([place]) Active Problems:   AKI (acute kidney injury) ([place])  Septic shock secondary to UTI: Patient was found to have hypotension, tachycardia, tachypnea, requiring Levophed support. Presumed UTI, renal ultrasound does not show hydronephrosis Levophed subsequently discontinued.  Maintaining blood pressure Completed IV cefepime for 5 days.  Urine / Blood cultures negative. Septic shock resolved.  GI bleeding: Patient had 2 bloody bowel movements on 9/14 with a drop of hemoglobin by 2 points EGD done shows gastric and duodenal ulcers and erosion without active bleeding. S/p 1 unit PRBC.  Continue pantoprazole 40 mg twice daily. Continue to  trend hemoglobin. Hb stable around 8.0 If abdominal pain or bleeding recurs, Consider CT abdomen for ischemic colitis. Keep hemoglobin above 7.  GI signed off.  Acute on chronic stage IIIb renal failure: Baseline serum creatinine 1.8. Presented with creatinine of 5.44. Avoid nephrotoxic medications,  continued on IV hydration.   Renal functions has improved.  AKI has resolved.  Type 2 diabetes Continue home medication  Tyler Aas and Actos  Hypothyroidism: Continue Synthroid.  Right tibial fracture: May need to clarify with Ortho whether she can weight-bear or not.  Sore throat: Patient was evaluated by speech and swallow with large pocket of food observed in the right cheek with decreased mentation Advance diet after swallow evaluation,  mental status has improved now  Severe deconditioning  Protein calorie malnutrition Continue feeds as per dietitian PT and OT evaluation Ambulate as possible.  Hypernatremia: Start D5 half-normal saline at 50cc for 12 hours. Continue to monitor labs.  Hypophosphatemia: Replaced.  Continue to monitor  Penile swelling: Urology consulted.  Awaiting recommendation. Continue scrotal support.   DVT prophylaxis: Heparin Code Status: Full code Family Communication: Wife at bedside Disposition Plan:   Status is: Inpatient Remains inpatient appropriate because: Admitted for septic shock secondary to presumed UTI requiring IV antibiotics.  Patient had  GI bleed underwent EGD, found to have nonbleeding ulcers . H&H is remained stable.  Anticipated discharge to SNF in 1 to 2 days.    Consultants:  Gastroenterology, PCCM  Procedures: EGD Antimicrobials: Cefepime  Subjective: Patient was seen and examined at bedside.  Overnight events noted.   Patient reports feeling better, reports weak and tired. He reports pain in the right leg  from recent fall and fracture Patient found to have significant swelling of the scrotum and the  foreskin.  Objective: Vitals:   03/05/22 1953 03/06/22 0459 03/06/22 0500 03/06/22 1317  BP: (!) 113/56 (!) 147/68  (!) 153/72  Pulse: 97 87  85  Resp: '16 16  14  '$ Temp: 98.5 F (36.9 C) 99 F (37.2 C)  98.6 F (37 C)  TempSrc: Oral Oral  Oral  SpO2: 100% 100%  100%  Weight:   78.2 kg   Height:        Intake/Output Summary (Last 24 hours) at 03/06/2022 1403 Last data filed at 03/06/2022 1116 Gross per 24 hour  Intake 300 ml  Output 200 ml  Net 100 ml   Filed Weights   03/04/22 1345 03/05/22 0500 03/06/22 0500  Weight: 81.7 kg 81.7 kg 78.2 kg    Examination:  General exam: Appears comfortable, not in any acute distress, deconditioned Respiratory system: CTA bilaterally, respiratory effort normal, RR 16 Cardiovascular system: S1 & S2 heard, regular rate and rhythm, no murmur. Gastrointestinal system: Abdomen is soft, non tender, non distended, BS+ Central nervous system: Alert and oriented x 2 . No focal neurological deficits. Extremities: Mild tenderness noted in the right lower extremity. Skin: Scrotal swelling noted, redness of foreskin. Psychiatry: Judgement and insight appear normal. Mood & affect appropriate.     Data Reviewed: I have personally reviewed following labs and imaging studies  CBC: Recent Labs  Lab 02/28/22 1022 02/28/22 1112 03/02/22 0730 03/02/22 1332 03/03/22 0157 03/04/22 0356 03/05/22 0526 03/06/22 0441  WBC 9.0   < > 9.5  --  8.5 8.2 8.4 10.8*  NEUTROABS 7.3  --   --   --   --   --  5.6  --   HGB 12.1*   < > 8.2* 7.1* 8.8* 7.9* 8.0* 8.1*  HCT 36.6*   < > 24.5* 21.5* 25.6* 23.6* 24.1* 25.9*  MCV 98.1   < > 96.5  --  91.1 95.5 96.0 99.6  PLT 248   < > 183  --  151 123* 129* 171   < > = values in this interval not displayed.   Basic Metabolic Panel: Recent Labs  Lab 02/28/22 1513 02/28/22 1720 03/01/22 0415 03/02/22 0730 03/03/22 0157 03/04/22 0356 03/05/22 0526 03/06/22 0441  NA  --    < > 135 141 143 148* 151* 152*  K  --     < > 4.3 4.1 4.1 4.0 4.0 3.9  CL  --    < > 108 119* 121* 124* 127* 124*  CO2  --    < > 17* 19* 20* 21* 20* 22  GLUCOSE  --    < > 254* 221* 169* 132* 125* 130*  BUN  --    < > 130* 110* 94* 65* 43* 32*  CREATININE  --    < > 3.34* 1.82* 1.44* 1.29* 1.25* 1.20  CALCIUM  --    < > 8.1* 7.8* 8.1* 8.2* 8.7* 8.9  MG 2.5*  --  2.3  --   --   --  1.8 1.8  PHOS 4.5  --   --   --   --   --  1.7* 2.1*   < > = values in this interval not displayed.   GFR: Estimated Creatinine Clearance: 45 mL/min (by C-G formula based on SCr of 1.2 mg/dL). Liver Function Tests: Recent Labs  Lab 02/28/22 1022 02/28/22 1720  AST 11* 12*  ALT  16 14  ALKPHOS 96 79  BILITOT 1.0 1.0  PROT 6.6 5.3*  ALBUMIN 2.4* 2.0*   No results for input(s): "LIPASE", "AMYLASE" in the last 168 hours. No results for input(s): "AMMONIA" in the last 168 hours. Coagulation Profile: Recent Labs  Lab 02/28/22 1022  INR 1.4*   Cardiac Enzymes: No results for input(s): "CKTOTAL", "CKMB", "CKMBINDEX", "TROPONINI" in the last 168 hours. BNP (last 3 results) No results for input(s): "PROBNP" in the last 8760 hours. HbA1C: No results for input(s): "HGBA1C" in the last 72 hours. CBG: Recent Labs  Lab 03/05/22 1948 03/05/22 2337 03/06/22 0332 03/06/22 0725 03/06/22 1128  GLUCAP 161* 121* 106* 112* 149*   Lipid Profile: No results for input(s): "CHOL", "HDL", "LDLCALC", "TRIG", "CHOLHDL", "LDLDIRECT" in the last 72 hours. Thyroid Function Tests: No results for input(s): "TSH", "T4TOTAL", "FREET4", "T3FREE", "THYROIDAB" in the last 72 hours. Anemia Panel: No results for input(s): "VITAMINB12", "FOLATE", "FERRITIN", "TIBC", "IRON", "RETICCTPCT" in the last 72 hours. Sepsis Labs: Recent Labs  Lab 02/28/22 1231 02/28/22 1408 02/28/22 1513 02/28/22 1720  LATICACIDVEN 2.1* 1.2 1.0 0.7    Recent Results (from the past 240 hour(s))  Urine Culture     Status: None   Collection Time: 02/28/22 10:31 AM   Specimen: In/Out  Cath Urine  Result Value Ref Range Status   Specimen Description   Final    IN/OUT CATH URINE Performed at [facility], [place] [address] [place], [place] [address]    Special Requests   Final    NONE Performed at [facility], [place] [address] [place], [place] [address]    Culture   Final    NO GROWTH Performed at [facility], [place] [address] [place], [place] [address]    Report Status 03/01/2022 FINAL  Final  Blood Culture (routine x 2)     Status: None   Collection Time: 02/28/22 10:56 AM   Specimen: BLOOD  Result Value Ref Range Status   Specimen Description   Final    BLOOD BLOOD RIGHT ARM Performed at [place] [address], [place], [place] [address]    Special Requests   Final    BOTTLES DRAWN AEROBIC AND ANAEROBIC Blood Culture adequate volume Performed at [place] [address] [place], [place] [address]    Culture   Final    NO GROWTH 5 DAYS Performed at [facility], [place] [address], [place], [place] [address]    Report Status 03/05/2022 FINAL  Final  Blood Culture (routine x 2)     Status: None   Collection Time: 02/28/22 11:03 AM   Specimen: BLOOD RIGHT HAND  Result Value Ref Range Status   Specimen Description   Final    BLOOD RIGHT HAND Performed at [place] [address] [place], [place] [address]    Special Requests   Final    BOTTLES DRAWN AEROBIC ONLY Blood Culture results may not be optimal due to an inadequate volume of blood received in culture bottles Performed at [place] [address] [place], [place] [address]    Culture   Final    NO GROWTH 5 DAYS Performed at [facility], [place] [address], [place], [place] [address]    Report Status 03/05/2022 FINAL  Final  MRSA Next Gen by PCR, Nasal     Status: None   Collection Time: 02/28/22  2:52 PM  Specimen: Nasal Mucosa; Nasal Swab  Result  Value Ref Range Status   MRSA by PCR Next Gen NOT DETECTED NOT DETECTED Final    Comment: (NOTE) The GeneXpert MRSA Assay (FDA approved for NASAL specimens only), is one component of a comprehensive MRSA colonization surveillance program. It is not intended to diagnose MRSA infection nor to guide or monitor treatment for MRSA infections. Test performance is not FDA approved in patients less than 65 years old. Performed at [facility], [place] [address], [place], [place] [address]     Radiology Studies: No results found.  Scheduled Meds:  brimonidine  1 drop Both Eyes TID   clotrimazole-betamethasone   Topical BID   dorzolamide-timolol  1 drop Both Eyes BID   insulin aspart  0-9 Units Subcutaneous Q4H   latanoprost  1 drop Both Eyes QHS   levothyroxine  88 mcg Oral QAC breakfast   memantine  10 mg Oral Daily   Netarsudil Dimesylate  1 drop Both Eyes QHS   pantoprazole  40 mg Oral BID   phosphorus  250 mg Oral TID   Continuous Infusions:  sodium chloride     dextrose 5 % and 0.45% NaCl 50 mL/hr at 03/06/22 1002     LOS: 6 days    Time spent: 35 mins    Maryna Yeagle, MD [facility]   If 7PM-7AM, please contact night-coverage

## 2022-03-06 NOTE — Consult Note (Addendum)
Urology Consult   Physician requesting consult: Shawna Clamp  Reason for consult: Penoscrotal edema  History of Present Illness: Mike Mack is a 86 y.o.  male with PMH significant for polio, stroke 2014, DM II, neuropathy, HTN, CKD, and SBO who was admitted on 02/28/22 for treatment of sepsis with altered mental status. During the hospitalization pt was noted to have multiple episodes of hematochezia which GI eval revealed to be due to gastric and duodenal ulcers.    He had a primafit cath in place which the pt removed yesterday afternoon.  Per RN note in the chart his foreskin was retracted after cath was removed because pt moaned when incontinent of urine.  She was not able to replace the foreskin due to edema at meatus.  She states an MD was notified who then examined the pt and the cath was not replaced, however, it is unclear if the foreskin was reduced at that time.  Urology consult was requested today due to penoscrotal edema and pain.   Pt is not mentally clear and is unable to answer questions.  He responds to everything with "I'm not sure" or "I don't know".  He was last evaled by Dr. Gloriann Loan as an outpt 10/28/21 with CT hematuria protocol which was negative for GU malignancy. He has a hx of prostatomegaly, paraphimosis, and balanitis.   Past Medical History:  Diagnosis Date   Anemia 05/13/2013   Arthritis    "right leg" (07/25/2013)   BPH (benign prostatic hyperplasia)    Constipation 05/13/2013   Exertional shortness of breath    "sometimes" (07/25/2013)   History of kidney stones    History of stomach ulcers    Hypertension    Neuropathy    Polio    Polio Childhood   Small bowel obstruction ([place])    Stroke ([place]) 2014   residual:  "left hand kind of numb" (07/25/2013)   Type II diabetes mellitus ([place])     Past Surgical History:  Procedure Laterality Date   BIOPSY  11/30/2020   Procedure: BIOPSY;  Surgeon: Ronnette Juniper, MD;  Location: [facility];  Service:  Gastroenterology;;   BIOPSY  03/03/2022   Procedure: BIOPSY;  Surgeon: Clarene Essex, MD;  Location: [facility];  Service: Gastroenterology;;   BOWEL RESECTION     CATARACT EXTRACTION W/ INTRAOCULAR LENS  IMPLANT, BILATERAL Bilateral 2000's   CHOLECYSTECTOMY N/A 07/25/2013   Procedure: LAPAROSCOPIC CHOLECYSTECTOMY WITH INTRAOPERATIVE CHOLANGIOGRAM;  Surgeon: [name], MD;  Location: [facility];  Service: General;  Laterality: N/A;   COLON SURGERY     COLONOSCOPY     Hx: of   CYSTOSCOPY WITH BIOPSY N/A 12/01/2019   Procedure: CYSTOSCOPY WITH BLADDER BIOPSY AND FULGURATION;  Surgeon: Lucas Mallow, MD;  Location: [facility];  Service: Urology;  Laterality: N/A;   ESOPHAGOGASTRODUODENOSCOPY N/A 11/30/2020   Procedure: ESOPHAGOGASTRODUODENOSCOPY (EGD);  Surgeon: Ronnette Juniper, MD;  Location: [facility];  Service: Gastroenterology;  Laterality: N/A;   ESOPHAGOGASTRODUODENOSCOPY N/A 03/03/2022   Procedure: ESOPHAGOGASTRODUODENOSCOPY (EGD);  Surgeon: Clarene Essex, MD;  Location: [facility];  Service: Gastroenterology;  Laterality: N/A;   EXCISIONAL HEMORRHOIDECTOMY  2000's   EXTRACORPOREAL SHOCK WAVE LITHOTRIPSY Right 10/03/2018   Procedure: EXTRACORPOREAL SHOCK WAVE LITHOTRIPSY (ESWL);  Surgeon: Festus Aloe, MD;  Location: [facility];  Service: Urology;  Laterality: Right;   EYE SURGERY     INGUINAL HERNIA REPAIR Right 1970's   IR RADIOLOGIST EVAL & MGMT  04/06/2020   LAPAROSCOPIC CHOLECYSTECTOMY  07/25/2013   w/LOA (07/25/2013)  Current Hospital Medications:  Home Meds:  . Current Meds  Medication Sig   acetaminophen (TYLENOL) 325 MG tablet Take 2 tablets (650 mg total) by mouth every 6 (six) hours as needed for mild pain (or Fever >/= 101).   amLODipine (NORVASC) 5 MG tablet Take 5 mg by mouth daily.   aspirin EC 81 MG tablet Take 1 tablet (81 mg total) by mouth daily.   atorvastatin (LIPITOR) 20 MG tablet Take 20 mg by mouth every evening.    B Complex Vitamins (B COMPLEX PO) Take 1  tablet by mouth at bedtime.   brimonidine (ALPHAGAN) 0.2 % ophthalmic solution Place 1 drop into both eyes 3 (three) times daily.   cholecalciferol (VITAMIN D3) 10 MCG (400 UNIT) TABS tablet Take 400 Units by mouth daily.   dorzolamide-timolol (COSOPT) 22.3-6.8 MG/ML ophthalmic solution Place 1 drop into both eyes 2 (two) times daily.   feeding supplement, GLUCERNA SHAKE, (GLUCERNA SHAKE) LIQD Take 237 mLs by mouth 3 (three) times daily between meals. (Patient taking differently: Take 237 mLs by mouth 2 (two) times daily between meals.)   Finerenone (KERENDIA) 10 MG TABS Take 10 mg by mouth daily.   gabapentin (NEURONTIN) 600 MG tablet Take 600 mg by mouth 2 (two) times daily as needed for headache.   HYDROcodone-acetaminophen (NORCO/VICODIN) 5-325 MG tablet Take 1 tablet by mouth every 4 (four) hours as needed. (Patient taking differently: Take 1 tablet by mouth every 4 (four) hours as needed (for pain, when not taking Gabapentin).)   insulin degludec (TRESIBA FLEXTOUCH) 100 UNIT/ML FlexTouch Pen Inject 15 Units into the skin daily.   latanoprost (XALATAN) 0.005 % ophthalmic solution Place 1 drop into both eyes at bedtime.    levothyroxine (SYNTHROID) 88 MCG tablet Take 88 mcg by mouth at bedtime.    memantine (NAMENDA) 10 MG tablet Take 10 mg by mouth in the morning and at bedtime.   metoprolol tartrate (LOPRESSOR) 25 MG tablet Take 1 tablet (25 mg total) by mouth 2 (two) times daily. (Patient taking differently: Take 25 mg by mouth in the morning.)   Multiple Vitamin (MULTIVITAMIN WITH MINERALS) TABS tablet Take 1 tablet by mouth daily.   olmesartan (BENICAR) 20 MG tablet Take 20 mg by mouth daily.   pantoprazole (PROTONIX) 40 MG tablet Take 1 tablet (40 mg total) by mouth 2 (two) times daily before a meal. (Patient taking differently: Take 40 mg by mouth in the morning and at bedtime.)   pioglitazone (ACTOS) 30 MG tablet Take 30 mg by mouth daily.   polyethylene glycol (MIRALAX / GLYCOLAX)  packet Take 17 g by mouth daily. (Patient taking differently: Take 17 g by mouth daily as needed for mild constipation.)   RHOPRESSA 0.02 % SOLN Place 1 drop into both eyes at bedtime.   Tamsulosin HCl (FLOMAX) 0.4 MG CAPS Take 0.4 mg by mouth in the morning and at bedtime.     Scheduled Meds:  brimonidine  1 drop Both Eyes TID   dorzolamide-timolol  1 drop Both Eyes BID   insulin aspart  0-9 Units Subcutaneous Q4H   latanoprost  1 drop Both Eyes QHS   levothyroxine  88 mcg Oral QAC breakfast   memantine  10 mg Oral Daily   Netarsudil Dimesylate  1 drop Both Eyes QHS   pantoprazole  40 mg Oral BID   phosphorus  250 mg Oral TID   Continuous Infusions:  sodium chloride     dextrose 5 % and 0.45% NaCl 50 mL/hr at  03/06/22 1002   PRN Meds:.docusate sodium, fentaNYL (SUBLIMAZE) injection, HYDROcodone-acetaminophen, mouth rinse, polyethylene glycol  Allergies:  Allergies  Allergen Reactions   Metformin And Related Other (See Comments)    Gi intolerance   [name] [Insulin Glargine-Lixisenatide] Other (See Comments)    Stomach cramps    Family History  Problem Relation Age of Onset   Heart failure Mother    Hypertension Sister    Diabetes Sister    Diabetes Brother    Hypertension Brother     Social History:  reports that he has never smoked. He has never used smokeless tobacco. He reports that he does not drink alcohol and does not use drugs.  ROS: A complete review of systems could not be performed due to pt poor mental status.    Physical Exam:  Vital signs in last 24 hours: Temp:  [98.5 F (36.9 C)-99 F (37.2 C)] 99 F (37.2 C) (09/18 0459) Pulse Rate:  [87-97] 87 (09/18 0459) Resp:  [16] 16 (09/18 0459) BP: (113-150)/(56-88) 147/68 (09/18 0459) SpO2:  [100 %] 100 % (09/18 0459) Weight:  [78.2 kg] 78.2 kg (09/18 0500) Constitutional:  Alert, No acute distress Cardiovascular: Regular rate and rhythm Respiratory: Normal respiratory effort GI: Abdomen is soft,  nontender, nondistended, no abdominal masses GU: uncircum penis; paraphimosis with edema of glans and foreskin; balanitis over glans; no scrotal edema  Lymphatic: No lymphadenopathy Neurologic: Grossly intact, no focal deficits Psychiatric: Normal mood and affect  Laboratory Data:  Recent Labs    03/04/22 0356 03/05/22 0526 03/06/22 0441  WBC 8.2 8.4 10.8*  HGB 7.9* 8.0* 8.1*  HCT 23.6* 24.1* 25.9*  PLT 123* 129* 171    Recent Labs    03/04/22 0356 03/05/22 0526 03/06/22 0441  NA 148* 151* 152*  K 4.0 4.0 3.9  CL 124* 127* 124*  GLUCOSE 132* 125* 130*  BUN 65* 43* 32*  CALCIUM 8.2* 8.7* 8.9  CREATININE 1.29* 1.25* 1.20     Results for orders placed or performed during the hospital encounter of 02/28/22 (from the past 24 hour(s))  Glucose, capillary     Status: Abnormal   Collection Time: 03/05/22  4:37 PM  Result Value Ref Range   Glucose-Capillary 124 (H) 70 - 99 mg/dL  Glucose, capillary     Status: Abnormal   Collection Time: 03/05/22  7:48 PM  Result Value Ref Range   Glucose-Capillary 161 (H) 70 - 99 mg/dL   Comment 1 Notify RN   Glucose, capillary     Status: Abnormal   Collection Time: 03/05/22 11:37 PM  Result Value Ref Range   Glucose-Capillary 121 (H) 70 - 99 mg/dL  Glucose, capillary     Status: Abnormal   Collection Time: 03/06/22  3:32 AM  Result Value Ref Range   Glucose-Capillary 106 (H) 70 - 99 mg/dL  CBC     Status: Abnormal   Collection Time: 03/06/22  4:41 AM  Result Value Ref Range   WBC 10.8 (H) 4.0 - 10.5 K/uL   RBC 2.60 (L) 4.22 - 5.81 MIL/uL   Hemoglobin 8.1 (L) 13.0 - 17.0 g/dL   HCT 25.9 (L) 39.0 - 52.0 %   MCV 99.6 80.0 - 100.0 fL   MCH 31.2 26.0 - 34.0 pg   MCHC 31.3 30.0 - 36.0 g/dL   RDW 15.4 11.5 - 15.5 %   Platelets 171 150 - 400 K/uL   nRBC 0.2 0.0 - 0.2 %  Magnesium     Status: None  Collection Time: 03/06/22  4:41 AM  Result Value Ref Range   Magnesium 1.8 1.7 - 2.4 mg/dL  Basic metabolic panel     Status:  Abnormal   Collection Time: 03/06/22  4:41 AM  Result Value Ref Range   Sodium 152 (H) 135 - 145 mmol/L   Potassium 3.9 3.5 - 5.1 mmol/L   Chloride 124 (H) 98 - 111 mmol/L   CO2 22 22 - 32 mmol/L   Glucose, Bld 130 (H) 70 - 99 mg/dL   BUN 32 (H) 8 - 23 mg/dL   Creatinine, Ser 1.20 0.61 - 1.24 mg/dL   Calcium 8.9 8.9 - 10.3 mg/dL   GFR, Estimated >60 >60 mL/min   Anion gap 6 5 - 15  Phosphorus     Status: Abnormal   Collection Time: 03/06/22  4:41 AM  Result Value Ref Range   Phosphorus 2.1 (L) 2.5 - 4.6 mg/dL  Glucose, capillary     Status: Abnormal   Collection Time: 03/06/22  7:25 AM  Result Value Ref Range   Glucose-Capillary 112 (H) 70 - 99 mg/dL  Glucose, capillary     Status: Abnormal   Collection Time: 03/06/22 11:28 AM  Result Value Ref Range   Glucose-Capillary 149 (H) 70 - 99 mg/dL   Recent Results (from the past 240 hour(s))  Urine Culture     Status: None   Collection Time: 02/28/22 10:31 AM   Specimen: In/Out Cath Urine  Result Value Ref Range Status   Specimen Description   Final    IN/OUT CATH URINE Performed at [facility], [place] [address] [place], [place] [address]    Special Requests   Final    NONE Performed at [facility], [place] [address], [place], [place] [address]    Culture   Final    NO GROWTH Performed at [facility], [place] [address], [place], [place] [address]    Report Status 03/01/2022 FINAL  Final  Blood Culture (routine x 2)     Status: None   Collection Time: 02/28/22 10:56 AM   Specimen: BLOOD  Result Value Ref Range Status   Specimen Description   Final    BLOOD BLOOD RIGHT ARM Performed at [facility] [address] [place], [place] [address]    Special Requests   Final    BOTTLES DRAWN AEROBIC AND ANAEROBIC Blood Culture adequate volume Performed at [facility] [address], [place], [place] [address]    Culture   Final    NO GROWTH 5  DAYS Performed at [facility], [place] [address] [place], [place] [address]    Report Status 03/05/2022 FINAL  Final  Blood Culture (routine x 2)     Status: None   Collection Time: 02/28/22 11:03 AM   Specimen: BLOOD RIGHT HAND  Result Value Ref Range Status   Specimen Description   Final    BLOOD RIGHT HAND Performed at [facility] [address] [place], [place] [address]    Special Requests   Final    BOTTLES DRAWN AEROBIC ONLY Blood Culture results may not be optimal due to an inadequate volume of blood received in culture bottles Performed at [facility] [address] [place],  [address]    Culture   Final    NO GROWTH 5 DAYS Performed at [facility], [place] [address] [place],  [address]  Report Status 03/05/2022 FINAL  Final  MRSA Next Gen by PCR, Nasal     Status: None   Collection Time: 02/28/22  2:52 PM   Specimen: Nasal Mucosa; Nasal Swab  Result Value Ref Range Status   MRSA by PCR Next Gen NOT DETECTED NOT DETECTED Final    Comment: (NOTE) The GeneXpert MRSA Assay (FDA approved for NASAL specimens only), is one component of a comprehensive MRSA colonization surveillance program. It is not intended to diagnose MRSA infection nor to guide or monitor treatment for MRSA infections. Test performance is not FDA approved in patients less than 80 years old. Performed at [facility], [place] [address] [place], [place] [address]     Renal Function: Recent Labs    02/28/22 1720 03/01/22 0415 03/02/22 0730 03/03/22 0157 03/04/22 0356 03/05/22 0526 03/06/22 0441  CREATININE 4.16* 3.34* 1.82* 1.44* 1.29* 1.25* 1.20   Estimated Creatinine Clearance: 45 mL/min (by C-G formula based on SCr of 1.2 mg/dL).  Radiologic Imaging: No results found.   Impression/Recommendation  Paraphimosis--unclear how long this has been present but at least since yesterday afternoon.  It was  successfully reduced and pain meds given.  Both RNs shown how foreskin should look at all times.   Balanitis--foreskin must be retracted to appropriately clean and dry the glans but must be replaced each time. Treat with clotrimazole/betamethasone ointment.   Depends for incontinence.  Condom cath should be avoided.    Debbrah Alar 03/06/2022, 1:11 PM

## 2022-03-06 NOTE — Progress Notes (Signed)
[facility] Gastroenterology Progress Note  Mike Mack 86 y.o. 04/26/1936  Subjective: Patient seen and examined laying in bed.  Notes last bowel movement was yesterday, no melena or hematochezia.  Hemoglobin stable today.  SLP recommending dysphagia 1 diet, pureed solids, thin liquids via straw, and assistance for all intake with constant cues for swallowing.  Recommend following solids with liquids.  ROS : Review of Systems  Gastrointestinal:  Negative for abdominal pain, blood in stool, constipation, diarrhea, heartburn, melena, nausea and vomiting.  Genitourinary:  Negative for dysuria and urgency.     Objective: Vital signs in last 24 hours: Vitals:   03/05/22 1953 03/06/22 0459  BP: (!) 113/56 (!) 147/68  Pulse: 97 87  Resp: 16 16  Temp: 98.5 F (36.9 C) 99 F (37.2 C)  SpO2: 100% 100%    Physical Exam:  General:  Alert, cooperative, no distress, appears stated age  Head:  Normocephalic, without obvious abnormality, atraumatic  Eyes:  Anicteric sclera, EOM's intact  Lungs:   Clear to auscultation bilaterally, respirations unlabored  Heart:  Regular rate and rhythm, S1, S2 normal  Abdomen:   Soft, non-tender, bowel sounds active all four quadrants,  no masses,   Extremities: Extremities normal, atraumatic, no  edema, hands in mitts  Pulses: 2+ and symmetric    Lab Results: Recent Labs    03/05/22 0526 03/06/22 0441  NA 151* 152*  K 4.0 3.9  CL 127* 124*  CO2 20* 22  GLUCOSE 125* 130*  BUN 43* 32*  CREATININE 1.25* 1.20  CALCIUM 8.7* 8.9  MG 1.8 1.8  PHOS 1.7* 2.1*   No results for input(s): "AST", "ALT", "ALKPHOS", "BILITOT", "PROT", "ALBUMIN" in the last 72 hours. Recent Labs    03/05/22 0526 03/06/22 0441  WBC 8.4 10.8*  NEUTROABS 5.6  --   HGB 8.0* 8.1*  HCT 24.1* 25.9*  MCV 96.0 99.6  PLT 129* 171   No results for input(s): "LABPROT", "INR" in the last 72 hours.    Assessment Oral phase dysphagia Melena Gastric and duodenal ulcers  EGD  03/03/2022 - Small hiatal hernia. - Severe reflux versus ischemic esophagitis with no bleeding. Biopsied. - Non-bleeding gastric ulcers and erosions with no stigmata of bleeding. Biopsied. - Duodenal erosions and small ulcers without bleeding. - Recommended long-term PPI.  Follow-up with the GI clinic as needed  HGB 8.1(8.0) Platelets 171  Hemoglobin stable at 8.1 this morning.  No further episodes of melena or hematochezia.  Patient has oral dysphagia currently managed by SLP recommending dysphagia 1 diet and assistance with eating.  Abdominal pain improved.  Awaiting biopsies from EGD.  Plan: Continue to monitor hemoglobin transfuse if less than 7 Diet per SLP orders Continue pantoprazole 40 mg twice daily. [facility] will sign off.   [name] [name] PA-C 03/06/2022, 10:21 AM  Contact #  [phone]

## 2022-03-06 NOTE — Care Management Important Message (Signed)
Important Message  Patient Details IM Letter given to the Patient. Name: Chistian Kasler MRN: [id number] Date of Birth: September 24, 1935   Medicare Important Message Given:  Yes     Kerin Salen 03/06/2022, 12:56 PM

## 2022-03-06 NOTE — Progress Notes (Signed)
Physical Therapy Treatment Patient Details Name: Mike Mack MRN: [id number] DOB: 06-09-1936 Today's Date: 03/06/2022   History of Present Illness Mike Mack is a 86 y.o. M with PMH of Polio and CVA with residual R-sided weakness, HTN, CKD, Type 2 DM, SBO , recent right tibial plateau fracture/NWB brought to [facility] 02/28/22  after slumping over in [facility] , Found to have UTI and AKI,  hypotensive , developed dark red stools, s/p  upper  GI endo.    PT Comments    Multiple attempts to get pt to participate. Max encouragement and coaxing to get pt to bend knee in preparation for rolling-he would comply but then vocally refuse to participate further. Wife was present during session-she attempted to get pt to participate as well but he still would not. Discussed d/c plan-wife stated she is planning to take pt back home. She stated her grandson lives with them and has been the one to assist with mobilizing pt at home.     Recommendations for follow up therapy are one component of a multi-disciplinary discharge planning process, led by the attending physician.  Recommendations may be updated based on patient status, additional functional criteria and insurance authorization.  Follow Up Recommendations  Skilled nursing-short term rehab (<3 hours/day) (wife stated today that she is planning to take pt back home) Can patient physically be transported by private vehicle: No   Assistance Recommended at Discharge Frequent or constant Supervision/Assistance  Patient can return home with the following Two people to help with walking and/or transfers;Two people to help with bathing/dressing/bathroom;Assist for transportation;Help with stairs or ramp for entrance;Assistance with feeding;Assistance with cooking/housework;Direct supervision/assist for medications management   Equipment Recommendations  None recommended by PT    Recommendations for Other Services       Precautions / Restrictions  Precautions Precautions: Fall Precaution Comments: RLE paralysis from polio; R proximal tibia/fibula fx 02/02/22 Restrictions Weight Bearing Restrictions: Yes RLE Weight Bearing: Non weight bearing Other Position/Activity Restrictions: per "ortho care everywhere" note: NWB, ok for gentle ROM,  does not require KI.     Mobility  Bed Mobility Overal bed mobility: Needs Assistance             General bed mobility comments: Attempted multiple times-pt refused to participate. Pt did not follow commands or initiate.    Transfers                        Ambulation/Gait                   Stairs             Wheelchair Mobility    Modified Rankin (Stroke Patients Only)       Balance                                            Cognition Arousal/Alertness: Awake/alert Behavior During Therapy: Flat affect   Area of Impairment: Orientation, Following commands                 Orientation Level: Disoriented to, Place, Time, Situation             General Comments: patient answered question of his name, not oriented otherwise; not following commands. wife was present and tried to get pt to participate but was unsuccessful        Exercises  General Comments        Pertinent Vitals/Pain Pain Assessment Pain Assessment: Faces Pain Location: with any movement (pt tends to moan/groan in anticipation of being touched) Pain Descriptors / Indicators: Grimacing, Guarding, Moaning Pain Intervention(s): Limited activity within patient's tolerance    Home Living Family/patient expects to be discharged to:: Private residence Living Arrangements: Spouse/significant other Available Help at Discharge: Family;Available 24 hours/day (wife is there but unable to assist physically, grandson lives with them but not home 24/7) Type of Home: House           Home Equipment: Wheelchair - manual Additional Comments: patients wife  reported that patient was able to complete transfers from bed to wheelchair himself and only just prior to admission to hospital did he need more physical assit from grandson. grandson lives with them but is not home 24/7    Prior Function            PT Goals (current goals can now be found in the care plan section) Progress towards PT goals: Not progressing toward goals - comment    Frequency    Min 2X/week      PT Plan Current plan remains appropriate    Co-evaluation   Reason for Co-Treatment: For patient/therapist safety PT goals addressed during session: Mobility/safety with mobility OT goals addressed during session: ADL's and self-care      AM-PAC PT "6 Clicks" Mobility   Outcome Measure  Help needed turning from your back to your side while in a flat bed without using bedrails?: Total Help needed moving from lying on your back to sitting on the side of a flat bed without using bedrails?: Total Help needed moving to and from a bed to a chair (including a wheelchair)?: Total Help needed standing up from a chair using your arms (e.g., wheelchair or bedside chair)?: Total Help needed to walk in hospital room?: Total Help needed climbing 3-5 steps with a railing? : Total 6 Click Score: 6    End of Session     Patient left: in bed;with call bell/phone within reach;with family/visitor present         Time: 5409-8119 PT Time Calculation (min) (ACUTE ONLY): 10 min  Charges:  $Therapeutic Activity: 8-22 mins                        Doreatha Massed, PT Acute Rehabilitation  Office: [phone] Pager: [phone]

## 2022-03-06 NOTE — NC FL2 (Signed)
[place] LEVEL OF CARE SCREENING TOOL     IDENTIFICATION  Patient Name: Mike Mack Birthdate: 1935/07/07 Sex: male Admission Date (Current Location): 02/28/2022  [facility] and [place] Number:  Herbalist and Address:  [facility],  [place], [place]      Provider Number: [id number]  Attending Physician Name and Address:  Shawna Clamp, MD  Relative Name and Phone Number:  Glenn, Gullickson [phone]  [phone]    Current Level of Care: Hospital Recommended Level of Care: [place] Prior Approval Number:    Date Approved/Denied:   PASRR Number: [id number]  Discharge Plan: SNF    Current Diagnoses: Patient Active Problem List   Diagnosis Date Noted   Septic shock ([place]) 02/28/2022   Pleural effusion, bilateral 10/21/2021   Malnutrition of moderate degree 02/23/2021   UTI (urinary tract infection) 02/22/2021   Altered mental status 02/21/2021   CKD (chronic kidney disease) stage 3, GFR 30-59 ml/min ([place]) 02/21/2021   Dementia ([place]) 11/27/2020   Polio osteopathy of lower leg, left ([place]) 11/27/2020   Subphrenic abscess ([place]) 03/19/2020   Failure to thrive (child) 03/19/2020   Posterior vitreous detachment of both eyes 12/11/2019   Syncope 10/25/2014   AKI (acute kidney injury) ([place]) 10/25/2014   H/O: CVA (cerebrovascular accident) 10/25/2014   H/O poliomyelitis 10/25/2014   Type 2 diabetes mellitus ([place]) 10/25/2014   Tachycardia 08/29/2013   Cough 08/29/2013   Monoparesis of leg ([place]) 08/20/2013   Muscle weakness of lower extremity 08/20/2013   Right upper quadrant abdominal abscess ([place]) 08/15/2013   Hepatic abscess 08/15/2013   Subacute delirium 08/14/2013   Hyperglycemia 08/14/2013   Oral thrush 08/14/2013   Leukocytosis, unspecified 08/14/2013   Debility 08/14/2013   S/P cholecystectomy 07/29/2013   Cholecystitis with cholelithiasis 07/25/2013   Arthritis 06/06/2013    Headache(784.0) 06/06/2013   Headache 06/06/2013   Generalized weakness 05/24/2013   Cholecystitis, acute 05/24/2013   RUQ abdominal pain 05/13/2013   Acute cholecystitis: Probable 05/13/2013   Constipation 05/13/2013   Enterococcus UTI 05/13/2013   Leukocytosis 05/13/2013   Anemia 05/13/2013   Acute left thalamic CVA (cerebral infarction) 04/10/2012   Hypertension    BPH (benign prostatic hyperplasia)    Pruritic disorder 02/02/2011    Orientation RESPIRATION BLADDER Height & Weight     Self, Place  Normal Incontinent Weight: 172 lb 6.4 oz (78.2 kg) Height:  '5\' 5"'$  (165.1 cm)  BEHAVIORAL SYMPTOMS/MOOD NEUROLOGICAL BOWEL NUTRITION STATUS      Incontinent Diet (Dysphagia 1 diet)  AMBULATORY STATUS COMMUNICATION OF NEEDS Skin   Limited Assist Verbally Surgical wounds                       Personal Care Assistance Level of Assistance  Bathing, Feeding, Dressing Bathing Assistance: Limited assistance Feeding assistance: Limited assistance Dressing Assistance: Limited assistance     Functional Limitations Info  Sight, Hearing, Speech Sight Info: Adequate Hearing Info: Adequate Speech Info: Adequate    SPECIAL CARE FACTORS FREQUENCY  PT (By licensed PT), OT (By licensed OT), Speech therapy     PT Frequency: Minimum 5x a week OT Frequency: Minimum 5x a week     Speech Therapy Frequency: Minimum 2x a week      Contractures Contractures Info: Not present    Additional Factors Info  Code Status, Allergies, Insulin Sliding Scale Code Status Info: Full Code Allergies Info: Metformin And Related   Soliqua (Insulin Glargine-lixisenatide)  Insulin Sliding Scale Info: insulin aspart (novoLOG) injection 0-9 Units       Current Medications (03/06/2022):  This is the current hospital active medication list Current Facility-Administered Medications  Medication Dose Route Frequency Provider Last Rate Last Admin   0.9 %  sodium chloride infusion  250 mL Intravenous  Continuous Magod, Altamese Dilling, MD       brimonidine (ALPHAGAN) 0.2 % ophthalmic solution 1 drop  1 drop Both Eyes TID Clarene Essex, MD   1 drop at 03/06/22 1545   clotrimazole-betamethasone (LOTRISONE) cream   Topical BID Debbrah Alar, PA-C   Given at 03/06/22 1557   dextrose 5 %-0.45 % sodium chloride infusion   Intravenous Continuous Shawna Clamp, MD 50 mL/hr at 03/06/22 1002 New Bag at 03/06/22 1002   docusate sodium (COLACE) capsule 100 mg  100 mg Oral BID PRN Clarene Essex, MD       dorzolamide-timolol (COSOPT) 22.3-6.8 MG/ML ophthalmic solution 1 drop  1 drop Both Eyes BID Clarene Essex, MD   1 drop at 03/06/22 0949   fentaNYL (SUBLIMAZE) injection 25-50 mcg  25-50 mcg Intravenous Q2H PRN Anders Simmonds, MD   25 mcg at 03/06/22 1326   HYDROcodone-acetaminophen (NORCO/VICODIN) 5-325 MG per tablet 1 tablet  1 tablet Oral Q4H PRN Rigoberto Noel, MD   1 tablet at 03/05/22 0055   insulin aspart (novoLOG) injection 0-9 Units  0-9 Units Subcutaneous Q4H Clarene Essex, MD   1 Units at 03/06/22 1544   latanoprost (XALATAN) 0.005 % ophthalmic solution 1 drop  1 drop Both Eyes QHS Clarene Essex, MD   1 drop at 03/05/22 2100   levothyroxine (SYNTHROID) tablet 88 mcg  88 mcg Oral QAC breakfast Clarene Essex, MD   88 mcg at 03/06/22 0520   memantine (NAMENDA) tablet 10 mg  10 mg Oral Daily Rigoberto Noel, MD   10 mg at 03/06/22 0950   Netarsudil Dimesylate 0.02 % SOLN 1 drop  1 drop Both Eyes QHS Clarene Essex, MD   1 drop at 03/03/22 2100   Oral care mouth rinse  15 mL Mouth Rinse PRN Clarene Essex, MD       pantoprazole (PROTONIX) EC tablet 40 mg  40 mg Oral BID Clarene Essex, MD   40 mg at 03/06/22 0950   phosphorus (K PHOS NEUTRAL) tablet 250 mg  250 mg Oral TID Shawna Clamp, MD   250 mg at 03/06/22 1544   polyethylene glycol (MIRALAX / GLYCOLAX) packet 17 g  17 g Oral Daily PRN Clarene Essex, MD         Discharge Medications: Please see discharge summary for a list of discharge medications.  Relevant Imaging  Results:  Relevant Lab Results:   Additional Information SSN [id number]  Ross Ludwig, LCSW

## 2022-03-06 NOTE — Progress Notes (Signed)
Speech Language Pathology Treatment: Dysphagia  Patient Details Name: Mike Mack MRN: [id number] DOB: 06-22-1935 Today's Date: 03/06/2022 Time: 5993-5701 SLP Time Calculation (min) (ACUTE ONLY): 15 min  Assessment / Plan / Recommendation Clinical Impression  Patient seen by SLP for skilled treatment focused on dysphagia goals. Patient was awake and alert and agreeable to "some water". SLP elevated bed for PO's as much as patient would tolerate but when doing so, he started urinating  and when doing so would wince in pain, moving a little in bed to seemingly get comfortable. He took straw sip of water and although he exhibited intermittent, brief oral holding of water in oral cavity and suspected delayed swallow initiation, he did not exhibit any overt s/s aspiration or penetration and voice remained WFL. Patient did exhibit some mild belching after PO's. He did start to get fixated on restraint mitt, at one point bringing it to his mouth and protruding tongue, attempting to lick the mitt as if a food item. SLP is recommending to continue with current diet of Dys 1 (puree) solids and thin liquids and will continue to follow for toleration and ability to upgrade.   HPI HPI: Mike Mack is a 86 y.o. M with PMH of Polio and CVA with residual R-sided weakness, HTN, CKD, Type 2 DM, SBO who was brought in after he slumped over in his wheelchair on the way to the an appointment today.  His wife is at the bedside and states that he has had throat pain with swallowing for approximately three weeks and subsequently very little po intake.  She denies fevers, chills, aspiration event, nausea or vomiting.   He had a recent fall with tib/fib fx on the R. Found to have UTI and AKI. HIstory of stomach ulcers.      SLP Plan  Continue with current plan of care      Recommendations for follow up therapy are one component of a multi-disciplinary discharge planning process, led by the attending physician.   Recommendations may be updated based on patient status, additional functional criteria and insurance authorization.    Recommendations  Diet recommendations: Thin liquid;Dysphagia 1 (puree) Liquids provided via: Straw Medication Administration: Whole meds with puree Supervision: Staff to assist with self feeding;Full supervision/cueing for compensatory strategies Compensations: Follow solids with liquid Postural Changes and/or Swallow Maneuvers: Seated upright 90 degrees;Upright 30-60 min after meal                Oral Care Recommendations: Oral care BID Follow Up Recommendations: Follow physician's recommendations for discharge plan and follow up therapies Assistance recommended at discharge: Intermittent Supervision/Assistance SLP Visit Diagnosis: Dysphagia, oral phase (R13.11) Plan: Continue with current plan of care       Sonia Baller, MA, CCC-SLP Speech Therapy

## 2022-03-06 NOTE — Evaluation (Signed)
Occupational Therapy Evaluation Patient Details Name: Mike Mack MRN: [id number] DOB: 1935-07-15 Today's Date: 03/06/2022   History of Present Illness Mike Mack is a 86 y.o. M with PMH of Polio and CVA with residual R-sided weakness, HTN, CKD, Type 2 DM, SBO , recent right tibial plateau fracture/NWB brought to Ed 02/28/22  after slumping over in [facility] , Found to have UTI and AKI,  hypotensive , developed dark red stools, s/p  upper  GI endo.   Clinical Impression   Patient is a 86 year old male who was admitted for above. Patient was living at home with wife and grandson at wheelchair level prior to hospital admission. Patient currently is resistive to all attempts at movement with patient calling out with anticipated touch. Wife was present attempted to encourage patient to participate in session. Patient's wife is physically unable to assist patient at home with grandson not present 24/7. Patient to continue on skilled OT caseload as a trial basis to see if we can progress for patient to be able to engage in ADLs and/or functional mobility to reduce caregiver burden in next level of care.      Recommendations for follow up therapy are one component of a multi-disciplinary discharge planning process, led by the attending physician.  Recommendations may be updated based on patient status, additional functional criteria and insurance authorization.   Follow Up Recommendations  Skilled nursing-short term rehab (<3 hours/day)    Assistance Recommended at Discharge Frequent or constant Supervision/Assistance  Patient can return home with the following Two people to help with walking and/or transfers;Two people to help with bathing/dressing/bathroom;Direct supervision/assist for medications management;Help with stairs or ramp for entrance;Assist for transportation;Assistance with feeding;Assistance with cooking/housework;Direct supervision/assist for financial management    Functional Status  Assessment  Patient has had a recent decline in their functional status and/or demonstrates limited ability to make significant improvements in function in a reasonable and predictable amount of time  Equipment Recommendations  Hospital bed;Wheelchair cushion (measurements OT);Wheelchair (measurements OT);Other (comment) (total lift)    Recommendations for Other Services       Precautions / Restrictions Precautions Precautions: Fall Precaution Comments: RLE paralysis from polio; R proximal tibia/fibula fx 02/02/22 Restrictions Weight Bearing Restrictions: Yes RLE Weight Bearing: Non weight bearing Other Position/Activity Restrictions: per "ortho care everywhere" note: NWB, ok for gentle ROM,  does not require KI.      Mobility Bed Mobility Overal bed mobility: Needs Assistance             General bed mobility comments: Attempted multiple times-pt refused to participate. Pt did not follow commands or initiate.    Transfers                          Balance                                           ADL either performed or assessed with clinical judgement   ADL Overall ADL's : Needs assistance/impaired Eating/Feeding: Total assistance;Bed level   Grooming: Bed level;Total assistance   Upper Body Bathing: Bed level;Total assistance   Lower Body Bathing: Bed level;Total assistance   Upper Body Dressing : Bed level;Total assistance   Lower Body Dressing: Bed level;Total assistance     Toilet Transfer Details (indicate cue type and reason): unable to progress to rolling with patient calling out  and resistive to movements.                 Vision   Additional Comments: unable to assess with patients current cognitive state     Perception     Praxis      Pertinent Vitals/Pain Pain Assessment Pain Assessment: Faces Faces Pain Scale: Hurts little more Pain Location: with any movement (pt tends to moan/groan in anticipation of  being touched) Pain Descriptors / Indicators: Grimacing, Guarding, Moaning Pain Intervention(s): Limited activity within patient's tolerance     Hand Dominance Right   Extremity/Trunk Assessment Upper Extremity Assessment Upper Extremity Assessment: Difficult to assess due to impaired cognition (patietnt was resistive to touch and woudl call out with anticipated touch. patient asked for therapist to " go sit down" multiple times during session)           Communication Communication Communication: No difficulties   Cognition Arousal/Alertness: Awake/alert Behavior During Therapy: Flat affect Overall Cognitive Status: Difficult to assess                                 General Comments: patient was not folowing commands with wife present and attempting to encourage patient to participate in therapy. patient would call out with all touch and resistive to assistance.     General Comments       Exercises     Shoulder Instructions      Home Living Family/patient expects to be discharged to:: Private residence Living Arrangements: Spouse/significant other Available Help at Discharge: Family;Available 24 hours/day (wife is there but unable to assist physically, grandson lives with them but not home 24/7) Type of Home: House                       Home Equipment: Wheelchair - manual   Additional Comments: patients wife reported that patient was able to complete transfers from bed to wheelchair himself and only just prior to admission to hospital did he need more physical assit from grandson. grandson lives with them but is not home 24/7      Prior Functioning/Environment               Mobility Comments: patients wife reported patient would use W/c around teh house. wife unable to offer physical assistance at home ADLs Comments: wife reported that patient was able to assist with ADL tasks at home prior to this hosptial admission.        OT  Problem List: Decreased activity tolerance;Decreased knowledge of use of DME or AE;Decreased coordination;Decreased range of motion;Pain;Decreased cognition      OT Treatment/Interventions: Self-care/ADL training;Therapeutic exercise;Neuromuscular education;Energy conservation;DME and/or AE instruction;Therapeutic activities;Balance training;Patient/family education    OT Goals(Current goals can be found in the care plan section) Acute Rehab OT Goals Patient Stated Goal: none stated OT Goal Formulation: With family Time For Goal Achievement: 03/20/22 Potential to Achieve Goals: Fair  OT Frequency: Min 1X/week    Co-evaluation PT/OT/SLP Co-Evaluation/Treatment: Yes Reason for Co-Treatment: For patient/therapist safety PT goals addressed during session: Mobility/safety with mobility OT goals addressed during session: ADL's and self-care      AM-PAC OT "6 Clicks" Daily Activity     Outcome Measure Help from another person eating meals?: A Lot Help from another person taking care of personal grooming?: Total Help from another person toileting, which includes using toliet, bedpan, or urinal?: Total Help from another person bathing (including washing,  rinsing, drying)?: Total Help from another person to put on and taking off regular upper body clothing?: Total Help from another person to put on and taking off regular lower body clothing?: Total 6 Click Score: 7   End of Session    Activity Tolerance: Patient limited by pain Patient left: in bed;with call bell/phone within reach;with family/visitor present  OT Visit Diagnosis: Unsteadiness on feet (R26.81);Muscle weakness (generalized) (M62.81);Pain                Time: 5852-7782 OT Time Calculation (min): 10 min Charges:  OT General Charges $OT Visit: 1 Visit OT Evaluation $OT Eval Low Complexity: 1 Low  Gladine Plude OTR/L, MS Acute Rehabilitation Department Office# [phone]   Marcellina Millin 03/06/2022, 12:14 PM

## 2022-03-06 NOTE — TOC Progression Note (Addendum)
Transition of Care [facility]) - Progression Note    Patient Details  Name: Mike Mack MRN: [id number] Date of Birth: 09/03/1935  [facility]) CM/SW Contact  Ross Ludwig, [place] Phone Number: 03/06/2022, 5:21 PM  Clinical Narrative:     CSW was informed that patient will need SNF placement before returning back home.  CSW spoke to patient's wife, she is agreeable to having patient go to SNF.  CSW was given permission to begin bed search in [facility].  CSW to continue to follow patient's progress throughout discharge planning.  Patient was at [facility] in the past.  Patient will need insurance authorization prior to discharge.   Expected Discharge Plan: Home/Self Care Barriers to Discharge: Continued Medical Work up  Expected Discharge Plan and Services Expected Discharge Plan: Home/Self Care   Discharge Planning Services: CM Consult   Living arrangements for the past 2 months: Single Family Home                                       Social Determinants of Health (SDOH) Interventions    Readmission Risk Interventions     No data to display

## 2022-03-07 DIAGNOSIS — A419 Sepsis, unspecified organism: Secondary | ICD-10-CM | POA: Diagnosis not present

## 2022-03-07 DIAGNOSIS — R6521 Severe sepsis with septic shock: Secondary | ICD-10-CM | POA: Diagnosis not present

## 2022-03-07 LAB — BASIC METABOLIC PANEL
Anion gap: 7 (ref 5–15)
BUN: 23 mg/dL (ref 8–23)
CO2: 21 mmol/L — ABNORMAL LOW (ref 22–32)
Calcium: 8 mg/dL — ABNORMAL LOW (ref 8.9–10.3)
Chloride: 119 mmol/L — ABNORMAL HIGH (ref 98–111)
Creatinine, Ser: 1.09 mg/dL (ref 0.61–1.24)
GFR, Estimated: >60 mL/min (ref >60)
Glucose, Bld: 171 mg/dL — ABNORMAL HIGH (ref 70–99)
Potassium: 3.6 mmol/L (ref 3.5–5.1)
Sodium: 147 mmol/L — ABNORMAL HIGH (ref 135–145)

## 2022-03-07 LAB — CBC
HCT: 24.9 % — ABNORMAL LOW (ref 39.0–52.0)
Hemoglobin: 7.9 g/dL — ABNORMAL LOW (ref 13.0–17.0)
MCH: 31.3 pg (ref 26.0–34.0)
MCHC: 31.7 g/dL (ref 30.0–36.0)
MCV: 98.8 fL (ref 80.0–100.0)
Platelets: 209 10*3/uL (ref 150–400)
RBC: 2.52 MIL/uL — ABNORMAL LOW (ref 4.22–5.81)
RDW: 15.1 % (ref 11.5–15.5)
WBC: 11.6 10*3/uL — ABNORMAL HIGH (ref 4.0–10.5)
nRBC: 0.3 % — ABNORMAL HIGH (ref 0.0–0.2)

## 2022-03-07 LAB — GLUCOSE, CAPILLARY
Glucose-Capillary: 118 mg/dL — ABNORMAL HIGH (ref 70–99)
Glucose-Capillary: 136 mg/dL — ABNORMAL HIGH (ref 70–99)
Glucose-Capillary: 159 mg/dL — ABNORMAL HIGH (ref 70–99)
Glucose-Capillary: 161 mg/dL — ABNORMAL HIGH (ref 70–99)
Glucose-Capillary: 187 mg/dL — ABNORMAL HIGH (ref 70–99)
Glucose-Capillary: 189 mg/dL — ABNORMAL HIGH (ref 70–99)

## 2022-03-07 LAB — SURGICAL PATHOLOGY

## 2022-03-07 MED ORDER — DEXTROSE-NACL 5-0.45 % IV SOLN: INTRAVENOUS | Status: DC

## 2022-03-07 NOTE — TOC Progression Note (Addendum)
Transition of Care [facility]) - Progression Note    Patient Details  Name: Mike Mack MRN: [id number] Date of Birth: [birth year]  Transition of [facility]) CM/SW Contact  Henrietta Dine, RN Phone Number: 03/07/2022, 1:07 PM  Clinical Narrative:  provided bed offers to pt's wife; await choice; prior auth.   [place] [facility] [employer] [place], [place] [address] [phone] Overall rating Above average 2. 1.6 mi [facility] for [facility] [place], [place] [address] [phone] Overall rating Much below average 3. 2.1 mi [facility] [place], [place] [address] [phone] Overall rating Much below average 4. 2.5 mi [facility] [address] [place], [place] [address] [phone] Overall rating Much below average 5. 2.8 mi [place] [address] [place], [place] [address] [phone] Overall rating Much below average 6. 2.9 mi [facility] at the [place], [place] [address] [phone] Overall rating Above average 7. [place], [place] [address] [phone] Overall rating Above average 8. 3.6 [place] [address] [place], [place] [address] [phone] Overall rating Average 9. 3.6 mi [facility] [address] [place], [place] [address] [phone] Overall rating Much below average 10. 3.9 mi [facility] [place], [place] [address] [phone] Overall rating Much below average 11. 4.4 mi [facility] at [place], [place] [address] [phone] Overall rating Much above average 12. 5.5  mi [facility] [address] [place], [place] [address] [phone] Overall rating Above average 13. 8.2 [facility] [place], [place] [address] [phone] Overall rating Much above average 14. 9 mi The [facility] [address] [place], [place] [address] [phone] Overall rating Above average 15. 9.1 mi [facility] [place], [place] [address] [phone] Overall rating Above average 16. 9.2 mi [facility] [address] [place], [place] [address] [phone] Overall rating Much above average 17. 10.8 mi [place] at [facility] [address] [place], [place] [address] [phone] Overall rating Much above average 18. 12.6 mi [facility] [address] [place], [place] [address] [phone] Overall rating Much below average 19. 12.8 [facility] [place], [place] [address] [phone] Overall rating Much below average 20. 14.2 mi [facility] [place] [facility] [address] [place], [place] [address] [phone] Overall rating Average 21. 14.4 mi [facility] [place], [place] [address] [phone] Overall rating Much above average 22. 14.8 mi [place] and [facility] [place], [place] [address] [phone] Overall rating Much above average 23. 14.9 [place] [address] [place], [place] [address] [phone] Overall rating Much below average 24. 16.5 mi [place] [address] [place], [place] [address] [phone] Overall rating Average 25. 16.7 mi [facility] [place], [place] [address] [phone] Overall rating Above average 26. 17.9 mi [place],  [address] [phone] Overall  rating Above average 27. 18.1 mi [facility] [address] [place], [place] [address] [phone] Overall rating Much below average 28. 19.7 mi [place] [address] [place], [place] [address] [phone] Overall rating Much below average 29. 20 mi [address] at the [facility], [place] [address] [phone] Overall rating Much above average 30. 21.1 mi [facility] [place], [place] [address] [phone] Overall rating Below average 31. 21.6 mi [address] [place], [place] [address] [phone] Overall rating Below average 32. 21.6 mi [facility] [address] [place], [place] [address] [phone] Overall rating Much below average 33. 21.8 [place], [place] [address] [phone] Overall rating Much above average 34. [address] [place], [place] [address] [phone] Overall rating Much above average 35. 22.6 mi [facility] [address] [place], [place] [address] [phone] Overall rating Below average 36. 22.7 mi [facility] [place], [place] [address] [phone] Overall rating Much below average 37. 23.3 mi [facility] [place], [place] [address] [phone] Overall rating Above average 38. 23.5 [place], [place] [address] [phone] Overall rating Not available18 39. 24.1 mi [facility] [address] [place], [place] [address] [phone] Overall  rating Average 40. 24.2 mi [facility] and [facility] [address] [place], [place] [address] [phone] Overall rating Average 41. 24.4 [facility] [address] [place], [place] [address] [phone] Overall rating Much below average 42. 24.5 mi [facility] [address] [place],  [address] [phone]    Expected Discharge Plan: Home/Self Care Barriers to Discharge: Continued Medical Work up  Expected Discharge Plan and Services Expected Discharge Plan: Home/Self Care   Discharge Planning Services: CM Consult   Living arrangements for the past 2 months: Single Family Home                                       Social Determinants of Health (SDOH) Interventions    Readmission Risk Interventions     No data to display

## 2022-03-07 NOTE — Progress Notes (Signed)
PROGRESS NOTE    Mike Mack  VOZ:[id number] DOB: 07-Jul-1935 DOA: 02/28/2022 PCP: [name], MD   Brief Narrative:  This 86 years old male with PMH significant for polio and CVA with residual right-sided weakness, hypertension, CKD stage IIIa, type 2 diabetes, history of small bowel obstruction who was brought in after he slumped over in his wheelchair on the way to his appointment.  His wife at bedside stated that he had throat pain while swallowing for approximately 3 weeks and subsequently has little p.o. intake.  She denies any fever,  chills,  aspiration events.  Patient had a recent fall with a right tibia and fibula fracture and was advised nonweightbearing. Patient was hypotensive and hypothermic , UA consistent with UTI. CT head negative. Chest x-ray without acute process.  Labs consistent with acute kidney injury with serum creatinine 5.4.  Patient was admitted in the ICU and started on IV vancomycin and cefepime,  also required Levophed support.  Now off pressors.  Patient had dark-colored stools, hemoglobin dropped and has required blood transfusion.  GI was consulted underwent EGD found to have gastric bulb ulcers and erosions without active bleeding.  PCCM pickup 9/17.  Assessment & Plan:   Principal Problem:   Septic shock ([place]) Active Problems:   AKI (acute kidney injury) ([place])  Septic shock secondary to UTI: Patient was found to have hypotension, tachycardia, tachypnea, requiring Levophed support. Presumed UTI, renal ultrasound does not show hydronephrosis Levophed subsequently discontinued.  Maintaining blood pressure Completed IV cefepime for 5 days.  Urine / Blood cultures negative. Septic shock resolved.  GI bleeding: Patient had 2 bloody bowel movements on 9/14 with a drop of hemoglobin by 2 points EGD done shows gastric and duodenal ulcers and erosion without active bleeding. S/p 1 unit PRBC.  Continue pantoprazole 40 mg twice daily. Continue to  trend hemoglobin. Hb stable around 8.0 If abdominal pain or bleeding recurs, Consider CT abdomen for ischemic colitis. Keep hemoglobin above 7.  GI signed off.  Acute on chronic stage IIIb renal failure: Baseline serum creatinine 1.8. Presented with creatinine of 5.44. Avoid nephrotoxic medications,  continued on IV hydration.   Renal functions has improved.  AKI has resolved.  Type 2 diabetes Continue home medication  Tresiba and Actos.  Hypothyroidism: Continue Synthroid.  Right tibial fracture: May need to clarify with Ortho whether He can weight-bear or not. Ortho consulted.  Sore throat: Patient was evaluated by speech and swallow with large pocket of food observed in the right cheek with decreased mentation Advance diet after swallow evaluation,  mental status has improved now. Patient resumed on a diet,  tolerating well.  Severe deconditioning  Protein calorie malnutrition Continue feeds as per dietitian PT and OT evaluation Ambulate as possible.  Hypernatremia: Started D5 half-normal saline at 50cc for 12 hours. Continue to monitor labs. Na+ Improved.  Hypophosphatemia: Replaced.  Continue to monitor  Penile swelling: Urology consulted.  Recommended topical antibiotics. Continue scrotal support.   DVT prophylaxis: Heparin Code Status: Full code Family Communication: Wife at bedside Disposition Plan:   Status is: Inpatient Remains inpatient appropriate because: Admitted for septic shock secondary to presumed UTI requiring IV antibiotics.  Patient had  GI bleed underwent EGD, found to have nonbleeding ulcers . H&H is remained stable.  Anticipated discharge to SNF in 1 to 2 days.    Consultants:  Gastroenterology, PCCM  Procedures: EGD Antimicrobials: Cefepime  Subjective: Patient was seen and examined at bedside.  Overnight events noted.   Patient reports  feeling better.  Still reports weakness and tiredness. He reports pain in the right leg from  recent fall and fracture Scrotal swelling, foreskin swelling improving.  Objective: Vitals:   03/07/22 0408 03/07/22 0411 03/07/22 0430 03/07/22 1250  BP:  133/80  (!) 144/74  Pulse:  71  79  Resp:  '18 10 16  '$ Temp:  98.1 F (36.7 C)  98 F (36.7 C)  TempSrc:  Oral  Oral  SpO2:  97%  100%  Weight: 77.7 kg     Height:        Intake/Output Summary (Last 24 hours) at 03/07/2022 1346 Last data filed at 03/07/2022 1300 Gross per 24 hour  Intake 778.17 ml  Output 450 ml  Net 328.17 ml   Filed Weights   03/05/22 0500 03/06/22 0500 03/07/22 0408  Weight: 81.7 kg 78.2 kg 77.7 kg    Examination:  General exam: Appears comfortable, not in any acute distress, deconditioned. Respiratory system: CTA bilaterally, respiratory effort normal, RR 16. Cardiovascular system: S1 & S2 heard, regular rate and rhythm, no murmur. Gastrointestinal system: Abdomen is soft, non tender, non distended, BS+ Central nervous system: Alert and oriented x 2 . No focal neurological deficits. Extremities: Mild tenderness noted in the right lower extremity. Skin: Scrotal swelling noted, redness of foreskin. Psychiatry: Judgement and insight appear normal. Mood & affect appropriate.     Data Reviewed: I have personally reviewed following labs and imaging studies  CBC: Recent Labs  Lab 03/03/22 0157 03/04/22 0356 03/05/22 0526 03/06/22 0441 03/07/22 0926  WBC 8.5 8.2 8.4 10.8* 11.6*  NEUTROABS  --   --  5.6  --   --   HGB 8.8* 7.9* 8.0* 8.1* 7.9*  HCT 25.6* 23.6* 24.1* 25.9* 24.9*  MCV 91.1 95.5 96.0 99.6 98.8  PLT 151 123* 129* 171 174   Basic Metabolic Panel: Recent Labs  Lab 02/28/22 1513 02/28/22 1720 03/01/22 0415 03/02/22 0730 03/03/22 0157 03/04/22 0356 03/05/22 0526 03/06/22 0441 03/07/22 0926  NA  --    < > 135   < > 143 148* 151* 152* 147*  K  --    < > 4.3   < > 4.1 4.0 4.0 3.9 3.6  CL  --    < > 108   < > 121* 124* 127* 124* 119*  CO2  --    < > 17*   < > 20* 21* 20* 22 21*   GLUCOSE  --    < > 254*   < > 169* 132* 125* 130* 171*  BUN  --    < > 130*   < > 94* 65* 43* 32* 23  CREATININE  --    < > 3.34*   < > 1.44* 1.29* 1.25* 1.20 1.09  CALCIUM  --    < > 8.1*   < > 8.1* 8.2* 8.7* 8.9 8.0*  MG 2.5*  --  2.3  --   --   --  1.8 1.8  --   PHOS 4.5  --   --   --   --   --  1.7* 2.1*  --    < > = values in this interval not displayed.   GFR: Estimated Creatinine Clearance: 49.4 mL/min (by C-G formula based on SCr of 1.09 mg/dL). Liver Function Tests: Recent Labs  Lab 02/28/22 1720  AST 12*  ALT 14  ALKPHOS 79  BILITOT 1.0  PROT 5.3*  ALBUMIN 2.0*   No results for input(s): "LIPASE", "  AMYLASE" in the last 168 hours. No results for input(s): "AMMONIA" in the last 168 hours. Coagulation Profile: No results for input(s): "INR", "PROTIME" in the last 168 hours.  Cardiac Enzymes: No results for input(s): "CKTOTAL", "CKMB", "CKMBINDEX", "TROPONINI" in the last 168 hours. BNP (last 3 results) No results for input(s): "PROBNP" in the last 8760 hours. HbA1C: No results for input(s): "HGBA1C" in the last 72 hours. CBG: Recent Labs  Lab 03/06/22 2015 03/07/22 0019 03/07/22 0404 03/07/22 0744 03/07/22 1141  GLUCAP 204* 189* 118* 136* 161*   Lipid Profile: No results for input(s): "CHOL", "HDL", "LDLCALC", "TRIG", "CHOLHDL", "LDLDIRECT" in the last 72 hours. Thyroid Function Tests: No results for input(s): "TSH", "T4TOTAL", "FREET4", "T3FREE", "THYROIDAB" in the last 72 hours. Anemia Panel: No results for input(s): "VITAMINB12", "FOLATE", "FERRITIN", "TIBC", "IRON", "RETICCTPCT" in the last 72 hours. Sepsis Labs: Recent Labs  Lab 02/28/22 1408 02/28/22 1513 02/28/22 1720  LATICACIDVEN 1.2 1.0 0.7    Recent Results (from the past 240 hour(s))  Urine Culture     Status: None   Collection Time: 02/28/22 10:31 AM   Specimen: In/Out Cath Urine  Result Value Ref Range Status   Specimen Description   Final    IN/OUT CATH URINE Performed at [facility], [place] [address], [place], [place] [address]    Special Requests   Final    NONE Performed at [facility], [place] [address], [place], [place] [address]    Culture   Final    NO GROWTH Performed at [facility], [place] [address] [place], [place] [address]    Report Status 03/01/2022 FINAL  Final  Blood Culture (routine x 2)     Status: None   Collection Time: 02/28/22 10:56 AM   Specimen: BLOOD  Result Value Ref Range Status   Specimen Description   Final    BLOOD BLOOD RIGHT ARM Performed at [facility] [address] [place], [place] [address]    Special Requests   Final    BOTTLES DRAWN AEROBIC AND ANAEROBIC Blood Culture adequate volume Performed at [place] [address], [place], [place] [address]    Culture   Final    NO GROWTH 5 DAYS Performed at [facility], [place] [address] [place], [place] [address]    Report Status 03/05/2022 FINAL  Final  Blood Culture (routine x 2)     Status: None   Collection Time: 02/28/22 11:03 AM   Specimen: BLOOD RIGHT HAND  Result Value Ref Range Status   Specimen Description   Final    BLOOD RIGHT HAND Performed at [facility] [address] [place], [place] [address]    Special Requests   Final    BOTTLES DRAWN AEROBIC ONLY Blood Culture results may not be optimal due to an inadequate volume of blood received in culture bottles Performed at [place] [address], [place], [place] [address]    Culture   Final    NO GROWTH 5 DAYS Performed at [facility], [place] [address] [place], [place] [address]    Report Status 03/05/2022 FINAL  Final  MRSA Next Gen by PCR, Nasal     Status: None   Collection Time: 02/28/22  2:52 PM   Specimen: Nasal Mucosa; Nasal Swab  Result Value Ref Range Status   MRSA by PCR Next Gen NOT DETECTED NOT DETECTED Final    Comment: (  NOTE) The GeneXpert MRSA  Assay (FDA approved for NASAL specimens only), is one component of a comprehensive MRSA colonization surveillance program. It is not intended to diagnose MRSA infection nor to guide or monitor treatment for MRSA infections. Test performance is not FDA approved in patients less than 41 years old. Performed at [facility], [place] [address], [place], [place] [address]     Radiology Studies: No results found.  Scheduled Meds:  brimonidine  1 drop Both Eyes TID   clotrimazole-betamethasone   Topical BID   dorzolamide-timolol  1 drop Both Eyes BID   insulin aspart  0-9 Units Subcutaneous Q4H   latanoprost  1 drop Both Eyes QHS   levothyroxine  88 mcg Oral QAC breakfast   memantine  10 mg Oral Daily   Netarsudil Dimesylate  1 drop Both Eyes QHS   pantoprazole  40 mg Oral BID   Continuous Infusions:  sodium chloride     dextrose 5 % and 0.45% NaCl 50 mL/hr at 03/07/22 1316     LOS: 7 days    Time spent: 35 mins    Severino Paolo, MD [facility]   If 7PM-7AM, please contact night-coverage

## 2022-03-07 NOTE — Progress Notes (Signed)
Speech Language Pathology Treatment: Dysphagia  Patient Details Name: Mike Mack MRN: [id number] DOB: 1935/06/26 Today's Date: 03/07/2022 Time: 6270-3500 SLP Time Calculation (min) (ACUTE ONLY): 16 min  Assessment / Plan / Recommendation Clinical Impression  Pt seen today to assess readiness for dietary advancement including consuming soft solids to maximize po intake.  Pt willing to consume soft solid - provided him with single small donut hole - which he consumed one single bite and swallowed before saying "I don't need anymore".  Pt did accept 4 boluses of strawberry mighty shake with improved efficiency of swallowing compared to Saturday 9/16.  He has not consumed any intake today - no breakfast, lunch or dinner per RN and per review of trays in room.  Given improved mentation, recommend advance diet to dys3/thin to halp maximize po intake.  Updated swallow precaution signs and informed RN of plan.   Will follow up to assure po tolerance - pt denies odynophagia, audible swallow noted without overt wincing. He states he is "full" but encouraged him to consume po intake.    HPI HPI: Mike Mack is a 86 y.o. M with PMH of Polio and CVA with residual R-sided weakness, HTN, CKD, Type 2 DM, SBO who was brought in after he slumped over in his wheelchair on the way to the an appointment today.  His wife is at the bedside and states that he has had throat pain with swallowing for approximately three weeks and subsequently very little po intake.  She denies fevers, chills, aspiration event, nausea or vomiting.   He had a recent fall with tib/fib fx on the R. Found to have UTI and AKI. HIstory of stomach ulcers.      SLP Plan  Continue with current plan of care      Recommendations for follow up therapy are one component of a multi-disciplinary discharge planning process, led by the attending physician.  Recommendations may be updated based on patient status, additional functional criteria and  insurance authorization.    Recommendations  Diet recommendations: Dysphagia 3 (mechanical soft);Thin liquid Liquids provided via: Cup;Straw Medication Administration: Whole meds with puree Supervision: Staff to assist with self feeding;Full supervision/cueing for compensatory strategies Compensations: Follow solids with liquid Postural Changes and/or Swallow Maneuvers: Seated upright 90 degrees;Upright 30-60 min after meal                Oral Care Recommendations: Oral care BID Follow Up Recommendations: Follow physician's recommendations for discharge plan and follow up therapies Assistance recommended at discharge: Frequent or constant Supervision/Assistance SLP Visit Diagnosis: Dysphagia, oral phase (R13.11) Plan: Continue with current plan of care         Kathleen Lime, MS [place] Office [phone] Pager [phone]   Macario Golds  03/07/2022, 6:31 PM

## 2022-03-08 DIAGNOSIS — R6521 Severe sepsis with septic shock: Secondary | ICD-10-CM | POA: Diagnosis not present

## 2022-03-08 DIAGNOSIS — A419 Sepsis, unspecified organism: Secondary | ICD-10-CM | POA: Diagnosis not present

## 2022-03-08 LAB — BASIC METABOLIC PANEL
Anion gap: 3 — ABNORMAL LOW (ref 5–15)
BUN: 15 mg/dL (ref 8–23)
CO2: 23 mmol/L (ref 22–32)
Calcium: 8.1 mg/dL — ABNORMAL LOW (ref 8.9–10.3)
Chloride: 118 mmol/L — ABNORMAL HIGH (ref 98–111)
Creatinine, Ser: 1.12 mg/dL (ref 0.61–1.24)
GFR, Estimated: >60 mL/min (ref >60)
Glucose, Bld: 144 mg/dL — ABNORMAL HIGH (ref 70–99)
Potassium: 3.3 mmol/L — ABNORMAL LOW (ref 3.5–5.1)
Sodium: 144 mmol/L (ref 135–145)

## 2022-03-08 LAB — GLUCOSE, CAPILLARY
Glucose-Capillary: 115 mg/dL — ABNORMAL HIGH (ref 70–99)
Glucose-Capillary: 117 mg/dL — ABNORMAL HIGH (ref 70–99)
Glucose-Capillary: 126 mg/dL — ABNORMAL HIGH (ref 70–99)
Glucose-Capillary: 129 mg/dL — ABNORMAL HIGH (ref 70–99)
Glucose-Capillary: 131 mg/dL — ABNORMAL HIGH (ref 70–99)
Glucose-Capillary: 147 mg/dL — ABNORMAL HIGH (ref 70–99)

## 2022-03-08 MED ORDER — MIRTAZAPINE 15 MG PO TBDP
15.0000 mg | ORAL_TABLET | Freq: Every day | ORAL | Status: DC
  Administered 2022-03-08 – 2022-03-09 (×2): 15 mg via ORAL
  Filled 2022-03-08 (×2): qty 1

## 2022-03-08 NOTE — Progress Notes (Signed)
PROGRESS NOTE    Mike Mack  HQI:[id number] DOB: Mar 30, 1936 DOA: 02/28/2022 PCP: [name], MD   Brief Narrative:  This 86 years old male with PMH significant for polio and CVA with residual right-sided weakness, hypertension, CKD stage IIIa, type 2 diabetes, history of small bowel obstruction who was brought in after he slumped over in his wheelchair on the way to his appointment.  His wife at bedside stated that he had throat pain while swallowing for approximately 3 weeks and subsequently has little p.o. intake.  She denies any fever,  chills,  aspiration events.  Patient had a recent fall with a right tibia and fibula fracture and was advised nonweightbearing. Patient was hypotensive and hypothermic , UA consistent with UTI. CT head negative. Chest x-ray without acute process.  Labs consistent with acute kidney injury with serum creatinine 5.4.  Patient was admitted in the ICU and started on IV vancomycin and cefepime,  also required Levophed support.  Now off pressors.  Patient had dark-colored stools, hemoglobin dropped and has required blood transfusion.  GI was consulted underwent EGD found to have gastric bulb ulcers and erosions without active bleeding.  PCCM pickup 9/17.  Needs SNF placement- continue to await labs and TOC to place  Assessment & Plan:   Principal Problem:   Septic shock ([place]) Active Problems:   AKI (acute kidney injury) ([place])  Septic shock secondary to UTI: Patient was found to have hypotension, tachycardia, tachypnea, requiring Levophed support. Presumed UTI, renal ultrasound does not show hydronephrosis Levophed subsequently discontinued.  Maintaining blood pressure Completed IV cefepime for 5 days.  Urine / Blood cultures negative. Septic shock resolved.  GI bleeding: Patient had 2 bloody bowel movements on 9/14 with a drop of hemoglobin by 2 points EGD done shows gastric and duodenal ulcers and erosion without active bleeding. S/p 1 unit  PRBC.  Continue pantoprazole 40 mg twice daily. Continue to trend hemoglobin. Hb stable around 8.0 If abdominal pain or bleeding recurs, Consider CT abdomen for ischemic colitis. Keep hemoglobin above 7.  GI signed off.  Acute on chronic stage IIIb renal failure: Baseline serum creatinine 1.8. Presented with creatinine of 5.44. Avoid nephrotoxic medications,  continued on IV hydration.   Renal functions has improved.  AKI has resolved.  Type 2 diabetes Continue home medication  Tresiba and Actos.  Hypothyroidism: Continue Synthroid.  Right tibial fracture: May need to clarify with Ortho whether He can weight-bear or not. Ortho consulted.  dysphagia Patient was evaluated by speech and swallow with large pocket of food observed in the right cheek with decreased mentation Advance diet after swallow evaluation,  mental status has improved now. Patient resumed on a diet,  tolerating well.  Severe deconditioning  Protein calorie malnutrition Continue feeds as per dietitian PT and OT evaluation Ambulate as possible. -added remeron for diet  Hypernatremia: -await labs -encourage PO intake  Hypophosphatemia: Replaced.  Continue to monitor  Penile swelling: Urology consulted.  Recommended topical antibiotics. Continue scrotal support.   Would recommend palliative care to follow at SNF   DVT prophylaxis: Heparin Code Status: Full code Family Communication: Wife on phone Disposition Plan:   Status is: Inpatient Remains inpatient appropriate because: await SNF placement      Consultants:  Gastroenterology, PCCM  Procedures: EGD   Subjective: Not hungry  Objective: Vitals:   03/08/22 0000 03/08/22 0144 03/08/22 0426 03/08/22 1218  BP:   (!) 178/87 (!) 159/70  Pulse:   80 86  Resp: 14  14 16  Temp:   99.4 F (37.4 C) 98.4 F (36.9 C)  TempSrc:   Oral   SpO2:   100% 100%  Weight:  78.5 kg    Height:        Intake/Output Summary (Last 24 hours) at  03/08/2022 1239 Last data filed at 03/08/2022 0900 Gross per 24 hour  Intake 1106.63 ml  Output 1450 ml  Net -343.37 ml   Filed Weights   03/06/22 0500 03/07/22 0408 03/08/22 0144  Weight: 78.2 kg 77.7 kg 78.5 kg    Examination:   General: Appearance:     Overweight male in no acute distress     Lungs:      respirations unlabored  Heart:    Normal heart rate. Normal rhythm. No murmurs, rubs, or gallops.   MS:   All extremities are intact.   Neurologic:   Awake, alert       Data Reviewed: I have personally reviewed following labs and imaging studies  CBC: Recent Labs  Lab 03/03/22 0157 03/04/22 0356 03/05/22 0526 03/06/22 0441 03/07/22 0926  WBC 8.5 8.2 8.4 10.8* 11.6*  NEUTROABS  --   --  5.6  --   --   HGB 8.8* 7.9* 8.0* 8.1* 7.9*  HCT 25.6* 23.6* 24.1* 25.9* 24.9*  MCV 91.1 95.5 96.0 99.6 98.8  PLT 151 123* 129* 171 662   Basic Metabolic Panel: Recent Labs  Lab 03/03/22 0157 03/04/22 0356 03/05/22 0526 03/06/22 0441 03/07/22 0926  NA 143 148* 151* 152* 147*  K 4.1 4.0 4.0 3.9 3.6  CL 121* 124* 127* 124* 119*  CO2 20* 21* 20* 22 21*  GLUCOSE 169* 132* 125* 130* 171*  BUN 94* 65* 43* 32* 23  CREATININE 1.44* 1.29* 1.25* 1.20 1.09  CALCIUM 8.1* 8.2* 8.7* 8.9 8.0*  MG  --   --  1.8 1.8  --   PHOS  --   --  1.7* 2.1*  --    GFR: Estimated Creatinine Clearance: 49.6 mL/min (by C-G formula based on SCr of 1.09 mg/dL). Liver Function Tests: No results for input(s): "AST", "ALT", "ALKPHOS", "BILITOT", "PROT", "ALBUMIN" in the last 168 hours.  No results for input(s): "LIPASE", "AMYLASE" in the last 168 hours. No results for input(s): "AMMONIA" in the last 168 hours. Coagulation Profile: No results for input(s): "INR", "PROTIME" in the last 168 hours.  Cardiac Enzymes: No results for input(s): "CKTOTAL", "CKMB", "CKMBINDEX", "TROPONINI" in the last 168 hours. BNP (last 3 results) No results for input(s): "PROBNP" in the last 8760 hours. HbA1C: No  results for input(s): "HGBA1C" in the last 72 hours. CBG: Recent Labs  Lab 03/07/22 2000 03/08/22 0017 03/08/22 0412 03/08/22 0744 03/08/22 1214  GLUCAP 187* 131* 126* 115* 147*   Lipid Profile: No results for input(s): "CHOL", "HDL", "LDLCALC", "TRIG", "CHOLHDL", "LDLDIRECT" in the last 72 hours. Thyroid Function Tests: No results for input(s): "TSH", "T4TOTAL", "FREET4", "T3FREE", "THYROIDAB" in the last 72 hours. Anemia Panel: No results for input(s): "VITAMINB12", "FOLATE", "FERRITIN", "TIBC", "IRON", "RETICCTPCT" in the last 72 hours. Sepsis Labs: No results for input(s): "PROCALCITON", "LATICACIDVEN" in the last 168 hours.   Recent Results (from the past 240 hour(s))  Urine Culture     Status: None   Collection Time: 02/28/22 10:31 AM   Specimen: In/Out Cath Urine  Result Value Ref Range Status   Specimen Description   Final    IN/OUT CATH URINE Performed at [facility] [address], [place], [place] [address]  Special Requests   Final    NONE Performed at [facility], [place] [address], [place], [place] [address]    Culture   Final    NO GROWTH Performed at [facility], [place] [address] [place], [place] [address]    Report Status 03/01/2022 FINAL  Final  Blood Culture (routine x 2)     Status: None   Collection Time: 02/28/22 10:56 AM   Specimen: BLOOD  Result Value Ref Range Status   Specimen Description   Final    BLOOD BLOOD RIGHT ARM Performed at [facility] [address], [place], [place] [address]    Special Requests   Final    BOTTLES DRAWN AEROBIC AND ANAEROBIC Blood Culture adequate volume Performed at [facility] [address] [place], [place] [address]    Culture   Final    NO GROWTH 5 DAYS Performed at [facility], [place] [address], [place], [place] [address]    Report Status 03/05/2022 FINAL  Final  Blood Culture (routine x 2)     Status: None    Collection Time: 02/28/22 11:03 AM   Specimen: BLOOD RIGHT HAND  Result Value Ref Range Status   Specimen Description   Final    BLOOD RIGHT HAND Performed at [facility] [address] [place], [place] [address]    Special Requests   Final    BOTTLES DRAWN AEROBIC ONLY Blood Culture results may not be optimal due to an inadequate volume of blood received in culture bottles Performed at [facility] [address], [place], [place] [address]    Culture   Final    NO GROWTH 5 DAYS Performed at [facility], [place] [address], [place], [place] [address]    Report Status 03/05/2022 FINAL  Final  MRSA Next Gen by PCR, Nasal     Status: None   Collection Time: 02/28/22  2:52 PM   Specimen: Nasal Mucosa; Nasal Swab  Result Value Ref Range Status   MRSA by PCR Next Gen NOT DETECTED NOT DETECTED Final    Comment: (NOTE) The GeneXpert MRSA Assay (FDA approved for NASAL specimens only), is one component of a comprehensive MRSA colonization surveillance program. It is not intended to diagnose MRSA infection nor to guide or monitor treatment for MRSA infections. Test performance is not FDA approved in patients less than 59 years old. Performed at [facility], [place] [address] [place], [place] [address]     Radiology Studies: No results found.  Scheduled Meds:  brimonidine  1 drop Both Eyes TID   clotrimazole-betamethasone   Topical BID   dorzolamide-timolol  1 drop Both Eyes BID   insulin aspart  0-9 Units Subcutaneous Q4H   latanoprost  1 drop Both Eyes QHS   levothyroxine  88 mcg Oral QAC breakfast   memantine  10 mg Oral Daily   mirtazapine  15 mg Oral QHS   Netarsudil Dimesylate  1 drop Both Eyes QHS   pantoprazole  40 mg Oral BID   Continuous Infusions:  sodium chloride     dextrose 5 % and 0.45% NaCl 50 mL/hr at 03/08/22 0700     LOS: 8 days    Time spent: 35 mins    Geradine Girt, DO [facility]   If 7PM-7AM, please contact night-coverage

## 2022-03-08 NOTE — Progress Notes (Signed)
Occupational Therapy Treatment Patient Details Name: Mike Mack MRN: [id number] DOB: 12/05/1935 Today's Date: 03/08/2022   History of present illness Mike Mack is a 86 y.o. M with PMH of Polio and CVA with residual R-sided weakness, HTN, CKD, Type 2 DM, SBO , recent right tibial plateau fracture/NWB brought to Ed 02/28/22  after slumping over in [facility] , Found to have UTI and AKI,  hypotensive , developed dark red stools, s/p  upper  GI endo.   OT comments  Patient supine in bed with eyes closed when therapist entered the room. Treatment focused on functional mobility today with hopes to advance ADLs to OOB. Patient max x 2 for bed transfers. Able to sit edge of bed with min guard x 2 minutes with a lot of encouragement. Right knee pain and a headache limiting patient's activity tolerance. Cognition appears to be improved from evaluation though unsure of his baseline - he was able to answer questions and follow commands. Continue to recommend short term rehab at discharge.    Recommendations for follow up therapy are one component of a multi-disciplinary discharge planning process, led by the attending physician.  Recommendations may be updated based on patient status, additional functional criteria and insurance authorization.    Follow Up Recommendations  Skilled nursing-short term rehab (<3 hours/day)    Assistance Recommended at Discharge Frequent or constant Supervision/Assistance  Patient can return home with the following  Two people to help with walking and/or transfers;Two people to help with bathing/dressing/bathroom;Direct supervision/assist for medications management;Help with stairs or ramp for entrance;Assist for transportation;Assistance with feeding;Assistance with cooking/housework;Direct supervision/assist for financial management   Equipment Recommendations  Hospital bed;Wheelchair cushion (measurements OT);Wheelchair (measurements OT);Other (comment)    Recommendations  for Other Services      Precautions / Restrictions Precautions Precautions: Fall Precaution Comments: RLE paralysis from polio; R proximal tibia/fibula fx 02/02/22 Restrictions Weight Bearing Restrictions: No RLE Weight Bearing: Non weight bearing Other Position/Activity Restrictions: per "ortho care everywhere" note: NWB, ok for gentle ROM,  does not require KI.       Mobility Bed Mobility Overal bed mobility: Needs Assistance Bed Mobility: Supine to Sit, Sit to Supine Rolling: Max assist, +2 for physical assistance   Supine to sit: +2 for physical assistance, Max assist     General bed mobility comments: Patient max assist x 2 using bed pad to transfer to edge of bed with patient assisting minimally with upper body. Able to sit edge of bed approx 2 minutes before he began to lay himself back down. Patient able to control trunck with descent but total assist for lower body.    Transfers                         Balance                                           ADL either performed or assessed with clinical judgement   ADL                                              Extremity/Trunk Assessment              Vision       Perception  Praxis      Cognition Arousal/Alertness: Awake/alert Behavior During Therapy: Flat affect Overall Cognitive Status: No family/caregiver present to determine baseline cognitive functioning                                 General Comments: Limited verbalizations and participation. Able to grossly answer questions appropriately and follow commands.        Exercises      Shoulder Instructions       General Comments      Pertinent Vitals/ Pain       Pain Assessment Pain Assessment: Faces Faces Pain Scale: Hurts even more Pain Location: R knee Pain Descriptors / Indicators: Grimacing, Guarding, Moaning Pain Intervention(s): Limited activity within patient's  tolerance  Home Living                                          Prior Functioning/Environment              Frequency  Min 1X/week        Progress Toward Goals  OT Goals(current goals can now be found in the care plan section)  Progress towards OT goals: Progressing toward goals  Acute Rehab OT Goals OT Goal Formulation: Patient unable to participate in goal setting Time For Goal Achievement: 03/20/22 Potential to Achieve Goals: [place] Discharge plan remains appropriate    Co-evaluation          OT goals addressed during session:  (functional mobility, activity tolerance)      AM-PAC OT "6 Clicks" Daily Activity     Outcome Measure   Help from another person eating meals?: A Lot Help from another person taking care of personal grooming?: Total Help from another person toileting, which includes using toliet, bedpan, or urinal?: Total Help from another person bathing (including washing, rinsing, drying)?: Total Help from another person to put on and taking off regular upper body clothing?: Total Help from another person to put on and taking off regular lower body clothing?: Total 6 Click Score: 7    End of Session    OT Visit Diagnosis: Unsteadiness on feet (R26.81);Muscle weakness (generalized) (M62.81);Pain   Activity Tolerance Patient limited by pain   Patient Left in bed;with call bell/phone within reach   Nurse Communication  (okay to see)        Time: 1155-2080 OT Time Calculation (min): 10 min  Charges: OT General Charges $OT Visit: 1 Visit OT Treatments $Therapeutic Activity: 8-22 mins  Gustavo Lah, OTR/L [place]  Office [phone]   Lenward Chancellor 03/08/2022, 4:05 PM

## 2022-03-08 NOTE — TOC Progression Note (Addendum)
Transition of Care [facility]) - Progression Note    Patient Details  Name: Mike Mack MRN: [id number] Date of Birth: 11-22-1935  [facility] CM/SW Contact  Henrietta Dine, RN Phone Number: 03/08/2022, 1:27 PM  Clinical Narrative:   spoke with pt's wife at bedside; says she and her dtr will be visiting facility at 2pm today; requested call back [phone] home); will call around 3 pm for SNF choice; SNF auth.  - 259 pm - pt's wife not at bedside; attempted to contact her regarding SNF choice; left VM on her home phone [phone] and cell phone [phone]; call back number given [phone]; awaiting return call. - 308 pm- attempted to contact her regarding SNF choice; left VM on her home phone [phone] and cell phone [phone]; call back number given [phone]; awaiting return call; no additional contact numbers listed in demographics.   Expected Discharge Plan: Home/Self Care Barriers to Discharge: Continued Medical Work up  Expected Discharge Plan and Services Expected Discharge Plan: Home/Self Care   Discharge Planning Services: CM Consult   Living arrangements for the past 2 months: Single Family Home                                       Social Determinants of Health (SDOH) Interventions    Readmission Risk Interventions     No data to display

## 2022-03-08 NOTE — TOC Transition Note (Deleted)
Transition of Care [facility]) - CM/SW Discharge Note   Patient Details  Name: Mike Mack MRN: [id number] Date of Birth: 1935/10/21  [facility] of [facility]) CM/SW Contact:  Henrietta Dine, RN Phone Number: 03/08/2022, 1:22 PM   Clinical Narrative:   spoke with pt's wife at bedside; says she and her dtr will be visiting facility at 2pm today; requested call back [phone] home); will call around 3 pm for SNF choice; SNF auth.      Barriers to Discharge: Continued Medical Work up   Patient Goals and CMS Choice Patient states their goals for this hospitalization and ongoing recovery are:: to go home CMS Medicare.gov Compare Post Acute Care list provided to:: Patient    Discharge Placement                       Discharge Plan and Services   Discharge Planning Services: CM Consult                                 Social Determinants of Health (SDOH) Interventions     Readmission Risk Interventions     No data to display

## 2022-03-09 DIAGNOSIS — R6521 Severe sepsis with septic shock: Secondary | ICD-10-CM | POA: Diagnosis not present

## 2022-03-09 DIAGNOSIS — L8990 Pressure ulcer of unspecified site, unspecified stage: Secondary | ICD-10-CM | POA: Insufficient documentation

## 2022-03-09 DIAGNOSIS — A419 Sepsis, unspecified organism: Secondary | ICD-10-CM | POA: Diagnosis not present

## 2022-03-09 LAB — GLUCOSE, CAPILLARY
Glucose-Capillary: 108 mg/dL — ABNORMAL HIGH (ref 70–99)
Glucose-Capillary: 119 mg/dL — ABNORMAL HIGH (ref 70–99)
Glucose-Capillary: 130 mg/dL — ABNORMAL HIGH (ref 70–99)
Glucose-Capillary: 140 mg/dL — ABNORMAL HIGH (ref 70–99)
Glucose-Capillary: 172 mg/dL — ABNORMAL HIGH (ref 70–99)
Glucose-Capillary: 89 mg/dL (ref 70–99)

## 2022-03-09 MED ORDER — DOCUSATE SODIUM 100 MG PO CAPS: 100.0000 mg | ORAL_CAPSULE | Freq: Two times a day (BID) | ORAL | 0 refills | Status: DC | PRN

## 2022-03-09 MED ORDER — MIRTAZAPINE 15 MG PO TBDP: 15.0000 mg | ORAL_TABLET | Freq: Every day | ORAL | Status: DC

## 2022-03-09 MED ORDER — INSULIN ASPART 100 UNIT/ML IJ SOLN: 0.0000 [IU] | Freq: Three times a day (TID) | INTRAMUSCULAR | 11 refills | Status: DC

## 2022-03-09 MED ORDER — INSULIN ASPART 100 UNIT/ML IJ SOLN
0.0000 [IU] | Freq: Three times a day (TID) | INTRAMUSCULAR | Status: DC
  Administered 2022-03-09: 2 [IU] via SUBCUTANEOUS

## 2022-03-09 MED ORDER — HYDROCODONE-ACETAMINOPHEN 5-325 MG PO TABS: 1.0000 | ORAL_TABLET | ORAL | 0 refills | Status: DC | PRN

## 2022-03-09 NOTE — TOC Transition Note (Signed)
Transition of Care [facility]) - CM/SW Discharge Note   Patient Details  Name: Mike Mack MRN: [id number] Date of Birth: 1935-11-25  Transition of Care [facility]) CM/SW Contact:  Henrietta Dine, RN Phone Number: 03/09/2022, 2:59 PM   Clinical Narrative:  pt accepted by [facility] per Glendon Axe, Admissions Coordinator, SNF auth approved, Navi ID # [id number]; D/C summary and SN Transfer report sent via hub; room # 122-B, call report # [phone]; wife notified; pt transport by [facility]; called [facility], spoke with Dreama; no TOC needs.      Barriers to Discharge: No Barriers Identified   Patient Goals and CMS Choice Patient states their goals for this hospitalization and ongoing recovery are:: to go home CMS Medicare.gov Compare Post Acute Care list provided to:: Patient    Discharge Placement                       Discharge Plan and Services   Discharge Planning Services: CM Consult                                 Social Determinants of Health (SDOH) Interventions     Readmission Risk Interventions     No data to display

## 2022-03-09 NOTE — Care Management Important Message (Signed)
Important Message  Patient Details IM Letter placed in Patients room for his Wife. Name: Mike Mack MRN: [id number] Date of Birth: [birth year]   Medicare Important Message Given:  Yes     Kerin Salen 03/09/2022, 10:21 AM

## 2022-03-09 NOTE — Discharge Summary (Signed)
Physician Discharge Summary  Mike Mack OZY:[id number] DOB: Aug 07, 1935 DOA: 02/28/2022  PCP: [name], MD  Admit date: 02/28/2022 Discharge date: 03/09/2022  Admitted From: home Discharge disposition: SNF   Recommendations for Outpatient Follow-Up:   BID PPI x 6 weeks, then QD (e.g., protonix 40 mg po bid, then 40 mg po qd) indefinitely thereafter Please discuss resumption of ASA with neurology Would recommend Palliative care to follow at SNF   Discharge Diagnosis:   Principal Problem:   Septic shock ([place]) Active Problems:   AKI (acute kidney injury) ([place])    Discharge Condition: Improved.  Diet recommendation: DYS 3   Code status: Full.   History of Present Illness:   Mike Mack is a 86 y.o. M with PMH of Polio and CVA with residual R-sided weakness, HTN, CKD, Type 2 DM, SBO who was brought in after he slumped over in his wheelchair on the way to the an appointment today.  His wife is at the bedside and states that he has had throat pain with swallowing for approximately three weeks and subsequently very little po intake.  She denies fevers, chills, aspiration event, nausea or vomiting.   He had a recent fall with tib/fib fx on the R.    On presentation to the ED he was hypotensive and hypothermic, was given 30cc/kg IVF.  UA consistent with hematuria and UTI, CTH negative, CXR without acute process.  Labs significant for AKI with creatinine 5.4, K 5.4, WBC 9, lactic acid 1->2.1.  He was given vancomycin and cefepime and started on Levophed. PCCM consulted for admission   Hospital Course by Problem:   Septic shock secondary to UTI: Patient was found to have hypotension, tachycardia, tachypnea, requiring Levophed support. Presumed UTI, renal ultrasound does not show hydronephrosis Levophed subsequently discontinued.  Maintaining blood pressure Completed IV cefepime for 5 days.  Urine / Blood cultures negative. Septic shock resolved.   GI  bleeding: Patient had 2 bloody bowel movements on 9/14 with a drop of hemoglobin by 2 points EGD done shows gastric and duodenal ulcers and erosion without active bleeding. S/p 1 unit PRBC.  Continue pantoprazole 40 mg twice daily-- see above Continue to trend hemoglobin. Hb stable around 8.0 If abdominal pain or bleeding recurs, Consider CT abdomen for ischemic colitis. Keep hemoglobin above 7.  GI signed off. -please discuss resumption of ASA with neurology   Acute on chronic stage IIIb renal failure: Baseline serum creatinine 1.8. Presented with creatinine of 5.44. Avoid nephrotoxic medications,  continued on IV hydration.   Renal functions has improved.  AKI has resolved.   Type 2 diabetes -SSI for now   Hypothyroidism: Continue Synthroid.   Right tibial fracture: nonweightbearing.   dysphagia Patient was evaluated by speech and swallow with large pocket of food observed in the right cheek with decreased mentation Advance diet after swallow evaluation,  mental status has improved now. Patient resumed on a diet,  tolerating well.   Severe deconditioning  Protein calorie malnutrition Continue feeds as per dietitian PT and OT evaluation Ambulate as possible. -added remeron for diet   Hypernatremia: -await labs -encourage PO intake   Hypophosphatemia: Replaced.  Continue to monitor   Penile swelling: Urology consulted.  Recommended topical antibiotics. Continue scrotal support.     Would recommend palliative care to follow at SNF    Medical Consultants:   GI pCCM   Discharge Exam:   Vitals:   03/08/22 2118 03/09/22 0543  BP: 132/76 126/89  Pulse:  81 79  Resp: 18 20  Temp: 98.7 F (37.1 C) 97.9 F (36.6 C)  SpO2: 100%    Vitals:   03/08/22 1218 03/08/22 2118 03/09/22 0500 03/09/22 0543  BP: (!) 159/70 132/76  126/89  Pulse: 86 81  79  Resp: '16 18  20  '$ Temp: 98.4 F (36.9 C) 98.7 F (37.1 C)  97.9 F (36.6 C)  TempSrc:  Oral  Oral  SpO2:  100% 100%    Weight:   78.4 kg   Height:        General exam: Appears calm and comfortable.    The results of significant diagnostics from this hospitalization (including imaging, microbiology, ancillary and laboratory) are listed below for reference.     Procedures and Diagnostic Studies:   DG CHEST PORT 1 VIEW  Result Date: 02/28/2022 CLINICAL DATA:  Status post central venous catheter insertion. EXAM: PORTABLE CHEST 1 VIEW COMPARISON:  Radiograph February 28, 2022 at 1122 hours. FINDINGS: Right IJ central venous catheter with tip projecting over the superior cavoatrial junction. Cardiac monitoring leads are coiled over the chest. The heart size and mediastinal contours are unchanged. No focal airspace consolidation. No visible pleural effusion or pneumothorax. The visualized skeletal structures are unremarkable. IMPRESSION: Interval placement of a right IJ central venous catheter with tip projecting of the superior cavoatrial junction. No visible pneumothorax. Electronically Signed   By: Dahlia Bailiff M.D.   On: 02/28/2022 17:15   US RENAL  Result Date: 02/28/2022 CLINICAL DATA:  Acute kidney injury. EXAM: RENAL / URINARY TRACT ULTRASOUND COMPLETE COMPARISON:  CT abdomen pelvis dated September 16, 2021. Renal ultrasound dated December 03, 2020. FINDINGS: Right Kidney: Renal measurements: 10.0 x 4.4 x 5.4 cm = volume: 124 mL. Echogenicity within normal limits. No mass or hydronephrosis visualized. Two simple cysts in the upper pole measuring up to 1.9 cm. Left Kidney: Renal measurements: 11.1 x 5.0 x 5.1 cm = volume: 144 mL. Echogenicity within normal limits. No mass or hydronephrosis visualized. Two simple cysts measuring up to 4.1 cm. Bladder: Decompressed by Foley catheter. Other: None. IMPRESSION: 1. No acute abnormality. 2. Bilateral renal simple cysts. No follow-up imaging is recommended. Electronically Signed   By: Titus Dubin M.D.   On: 02/28/2022 16:33   DG Knee Right Port  Result  Date: 02/28/2022 CLINICAL DATA:  Altered mental status.  Post fall. EXAM: PORTABLE RIGHT KNEE - 1-2 VIEW COMPARISON:  Right knee radiographs-02/12/2022;; 02/02/2022; right knee CT-02/02/2022; pelvic and right femur radiographs-earlier same day FINDINGS: Osteopenia. Examination is further degraded due to obliquity and large field of view Redemonstrated slightly impacted medial tibial plateau fracture with persistent deformity though alignment appears unchanged compared to right knee radiographs performed 02/02/2022. Moderate-sized residual knee joint effusion. No evidence of lipohemarthrosis. Persistent chronic deformity of the patella without acute displaced fracture. Moderate tricompartmental degenerative change of the knee, worse within the lateral compartment, is suspected though incompletely evaluated due to obliquity. Chondrocalcinosis is seen within the lateral joint space. Distal vascular calcifications. No radiopaque foreign body. IMPRESSION: 1. Similar appearance of slightly impacted medial tibial plateau fracture without change in alignment compared to knee radiographs performed 02/02/2022. 2. Moderate-sized residual knee joint effusion without evidence of lipohemarthrosis. 3. Chronic deformity of the patella without acute displaced fracture. 4. Moderate tricompartmental degenerative change of the knee, worse within the lateral compartment. 5. Chondrocalcinosis of the lateral joint space as could be seen in the setting of CPPD. Electronically Signed   By: Sandi Mariscal M.D.   On:  02/28/2022 13:17   DG Chest Port 1 View  Result Date: 02/28/2022 CLINICAL DATA:  Altered mental status EXAM: PORTABLE CHEST 1 VIEW COMPARISON:  12/12/2021 FINDINGS: Cardiac size is within normal limits. There are no signs of pulmonary edema or focal pulmonary consolidation. There is blunting of both lateral CP angles. There is no pneumothorax. Left hemidiaphragm is elevated. IMPRESSION: There are no signs of pulmonary edema or  focal pulmonary consolidation. Small bilateral pleural effusions. Electronically Signed   By: Elmer Picker M.D.   On: 02/28/2022 12:00   DG Pelvis 1-2 Views  Result Date: 02/28/2022 CLINICAL DATA:  Lethargy, slumped over in wheelchair. EXAM: PELVIS - 1-2 VIEW COMPARISON:  Hip radiographs 02/12/2022 FINDINGS: There is no definite evidence of acute fracture, though positioning is suboptimal. Femoroacetabular alignment appears maintained. Soft tissues are unremarkable. A wire projects over the bladder. IMPRESSION: No definite acute fracture or dislocation, though positioning is suboptimal. If there is persistent clinical concern, repeat radiographs or cross-sectional imaging may be obtained. Electronically Signed   By: Valetta Mole M.D.   On: 02/28/2022 11:58   DG FEMUR, MIN 2 VIEWS RIGHT  Result Date: 02/28/2022 CLINICAL DATA:  Lethargy, fall EXAM: RIGHT FEMUR 2 VIEWS COMPARISON:  None Available. FINDINGS: No displaced fracture is seen in right femur. Osteopenia is seen in bony structures. Possible small effusion is present in the right knee. There are scattered arterial calcifications in soft tissues. IMPRESSION: No fracture or dislocation is seen in right femur. Possible small effusion is present in right knee. Electronically Signed   By: Elmer Picker M.D.   On: 02/28/2022 11:58   CT Head Wo Contrast  Result Date: 02/28/2022 CLINICAL DATA:  Mental status change EXAM: CT HEAD WITHOUT CONTRAST TECHNIQUE: Contiguous axial images were obtained from the base of the skull through the vertex without intravenous contrast. RADIATION DOSE REDUCTION: This exam was performed according to the departmental dose-optimization program which includes automated exposure control, adjustment of the mA and/or kV according to patient size and/or use of iterative reconstruction technique. COMPARISON:  CT head 02/21/2021 FINDINGS: Brain: Diffuse ventricular enlargement, unchanged. Biventricular diameter 57 mm,  unchanged. Third fourth and lateral ventricles all dilated. Patchy periventricular white matter hypodensity bilaterally unchanged. No acute infarct, hemorrhage, mass. 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 Vascular: Negative for hyperdense vessel Skull: Negative Sinuses/Orbits: Paranasal sinuses clear. Right mastoid effusion. Left mastoid sinus clear. Bilateral cataract extraction Other: None IMPRESSION: Communicating hydrocephalus, unchanged. Correlate with symptoms of normal pressure hydrocephalus. Chronic ischemic changes in the white matter and cerebellum bilaterally. No acute infarct or hemorrhage. Electronically Signed   By: Franchot Gallo M.D.   On: 02/28/2022 11:25     Labs:   Basic Metabolic Panel: Recent Labs  Lab 03/04/22 0356 03/05/22 0526 03/06/22 0441 03/07/22 0926 03/08/22 1612  NA 148* 151* 152* 147* 144  K 4.0 4.0 3.9 3.6 3.3*  CL 124* 127* 124* 119* 118*  CO2 21* 20* 22 21* 23  GLUCOSE 132* 125* 130* 171* 144*  BUN 65* 43* 32* 23 15  CREATININE 1.29* 1.25* 1.20 1.09 1.12  CALCIUM 8.2* 8.7* 8.9 8.0* 8.1*  MG  --  1.8 1.8  --   --   PHOS  --  1.7* 2.1*  --   --    GFR Estimated Creatinine Clearance: 48.3 mL/min (by C-G formula based on SCr of 1.12 mg/dL). Liver Function Tests: No results for input(s): "AST", "ALT", "ALKPHOS", "BILITOT", "PROT", "ALBUMIN" in the last 168 hours. No results for input(s): "  LIPASE", "AMYLASE" in the last 168 hours. No results for input(s): "AMMONIA" in the last 168 hours. Coagulation profile No results for input(s): "INR", "PROTIME" in the last 168 hours.  CBC: Recent Labs  Lab 03/03/22 0157 03/04/22 0356 03/05/22 0526 03/06/22 0441 03/07/22 0926  WBC 8.5 8.2 8.4 10.8* 11.6*  NEUTROABS  --   --  5.6  --   --   HGB 8.8* 7.9* 8.0* 8.1* 7.9*  HCT 25.6* 23.6* 24.1* 25.9* 24.9*  MCV 91.1 95.5 96.0 99.6 98.8  PLT 151 123* 129* 171 209   Cardiac Enzymes: No results for input(s): "CKTOTAL", "CKMB", "CKMBINDEX",  "TROPONINI" in the last 168 hours. BNP: Invalid input(s): "POCBNP" CBG: Recent Labs  Lab 03/08/22 1643 03/08/22 2024 03/09/22 0010 03/09/22 0412 03/09/22 0727  GLUCAP 117* 129* 130* 108* 119*   D-Dimer No results for input(s): "DDIMER" in the last 72 hours. Hgb A1c No results for input(s): "HGBA1C" in the last 72 hours. Lipid Profile No results for input(s): "CHOL", "HDL", "LDLCALC", "TRIG", "CHOLHDL", "LDLDIRECT" in the last 72 hours. Thyroid function studies No results for input(s): "TSH", "T4TOTAL", "T3FREE", "THYROIDAB" in the last 72 hours.  Invalid input(s): "FREET3" Anemia work up No results for input(s): "VITAMINB12", "FOLATE", "FERRITIN", "TIBC", "IRON", "RETICCTPCT" in the last 72 hours. Microbiology Recent Results (from the past 240 hour(s))  Urine Culture     Status: None   Collection Time: 02/28/22 10:31 AM   Specimen: In/Out Cath Urine  Result Value Ref Range Status   Specimen Description   Final    IN/OUT CATH URINE Performed at [facility], [place] [address], [place], [place] [address]    Special Requests   Final    NONE Performed at [facility], [place] [address], [place], [place] [address]    Culture   Final    NO GROWTH Performed at [facility], [place] [address], [place], [place] [address]    Report Status 03/01/2022 FINAL  Final  Blood Culture (routine x 2)     Status: None   Collection Time: 02/28/22 10:56 AM   Specimen: BLOOD  Result Value Ref Range Status   Specimen Description   Final    BLOOD BLOOD RIGHT ARM Performed at [place] [address], [place], [place] [address]    Special Requests   Final    BOTTLES DRAWN AEROBIC AND ANAEROBIC Blood Culture adequate volume Performed at [place] [address], [place], [place] [address]    Culture   Final    NO GROWTH 5 DAYS Performed at [facility], [place] [address] [place], [place] [address]     Report Status 03/05/2022 FINAL  Final  Blood Culture (routine x 2)     Status: None   Collection Time: 02/28/22 11:03 AM   Specimen: BLOOD RIGHT HAND  Result Value Ref Range Status   Specimen Description   Final    BLOOD RIGHT HAND Performed at [place] [address], [place], [place] [address]    Special Requests   Final    BOTTLES DRAWN AEROBIC ONLY Blood Culture results may not be optimal due to an inadequate volume of blood received in culture bottles Performed at [place] [address] [place], [place] [address]    Culture   Final    NO GROWTH 5 DAYS Performed at [facility], [place] [address], [place],  [address]  Report Status 03/05/2022 FINAL  Final  MRSA Next Gen by PCR, Nasal     Status: None   Collection Time: 02/28/22  2:52 PM   Specimen: Nasal Mucosa; Nasal Swab  Result Value Ref Range Status   MRSA by PCR Next Gen NOT DETECTED NOT DETECTED Final    Comment: (NOTE) The GeneXpert MRSA Assay (FDA approved for NASAL specimens only), is one component of a comprehensive MRSA colonization surveillance program. It is not intended to diagnose MRSA infection nor to guide or monitor treatment for MRSA infections. Test performance is not FDA approved in patients less than 13 years old. Performed at [facility], [place] [address] [place], [place] [address]      Discharge Instructions:   Discharge Instructions     Discharge instructions   Complete by: As directed    Dys 3 diet   Increase activity slowly   Complete by: As directed    No wound care   Complete by: As directed       Allergies as of 03/09/2022       Reactions   Metformin And Related Other (See Comments)   Gi intolerance   Sol[place]a [insulin Glargine-lixisenatide] Other (See Comments)   Stomach cramps        Medication List     STOP taking these medications    amLODipine 5 MG tablet Commonly known as: NORVASC    aspirin EC 81 MG tablet   gabapentin 600 MG tablet Commonly known as: NEURONTIN   Kerendia 10 MG Tabs Generic drug: Finerenone   metoprolol tartrate 25 MG tablet Commonly known as: LOPRESSOR   olmesartan 20 MG tablet Commonly known as: BENICAR   pioglitazone 30 MG tablet Commonly known as: ACTOS   Tresiba FlexTouch 100 UNIT/ML FlexTouch Pen Generic drug: insulin degludec       TAKE these medications    acetaminophen 325 MG tablet Commonly known as: TYLENOL Take 2 tablets (650 mg total) by mouth every 6 (six) hours as needed for mild pain (or Fever >/= 101).   atorvastatin 20 MG tablet Commonly known as: LIPITOR Take 20 mg by mouth every evening.   B COMPLEX PO Take 1 tablet by mouth at bedtime.   brimonidine 0.2 % ophthalmic solution Commonly known as: ALPHAGAN Place 1 drop into both eyes 3 (three) times daily.   cholecalciferol 10 MCG (400 UNIT) Tabs tablet Commonly known as: VITAMIN D3 Take 400 Units by mouth daily.   docusate sodium 100 MG capsule Commonly known as: COLACE Take 1 capsule (100 mg total) by mouth 2 (two) times daily as needed for mild constipation.   dorzolamide-timolol 22.3-6.8 MG/ML ophthalmic solution Commonly known as: COSOPT Place 1 drop into both eyes 2 (two) times daily.   feeding supplement (GLUCERNA SHAKE) Liqd Take 237 mLs by mouth 3 (three) times daily between meals. What changed: when to take this   HYDROcodone-acetaminophen 5-325 MG tablet Commonly known as: NORCO/VICODIN Take 1 tablet by mouth every 4 (four) hours as needed for moderate pain. What changed: reasons to take this   insulin aspart 100 UNIT/ML injection Commonly known as: novoLOG Inject 0-9 Units into the skin 3 (three) times daily with meals.   latanoprost 0.005 % ophthalmic solution Commonly known as: XALATAN Place 1 drop into both eyes at bedtime.   levothyroxine 88 MCG tablet Commonly known as: SYNTHROID Take 88 mcg by mouth at bedtime.    memantine 10 MG tablet Commonly known as: NAMENDA Take 10 mg by mouth  in the morning and at bedtime.   mirtazapine 15 MG disintegrating tablet Commonly known as: REMERON SOL-TAB Take 1 tablet (15 mg total) by mouth at bedtime.   multivitamin with minerals Tabs tablet Take 1 tablet by mouth daily.   ondansetron 8 MG disintegrating tablet Commonly known as: ZOFRAN-ODT Take 1 tablet (8 mg total) by mouth every 8 (eight) hours as needed for nausea.   pantoprazole 40 MG tablet Commonly known as: PROTONIX Take 1 tablet (40 mg total) by mouth 2 (two) times daily before a meal. What changed: when to take this   polyethylene glycol 17 g packet Commonly known as: MIRALAX / GLYCOLAX Take 17 g by mouth daily. What changed:  when to take this reasons to take this   Rhopressa 0.02 % Soln Generic drug: Netarsudil Dimesylate Place 1 drop into both eyes at bedtime.   tamsulosin 0.4 MG Caps capsule Commonly known as: FLOMAX Take 0.4 mg by mouth in the morning and at bedtime.          Time coordinating discharge: 45 min  Signed:  Geradine Girt DO  [facility] 03/09/2022, 10:11 AM

## 2022-03-09 NOTE — TOC Progression Note (Addendum)
Transition of Care [facility]) - Progression Note    Patient Details  Name: Mike Mack MRN: [id number] Date of Birth: [birth year]  [facility] CM/SW Contact  Henrietta Dine, RN Phone Number: 03/09/2022, 8:36 AM  Clinical Narrative:  pt's wife returned call; states she will have bed choice this afternoon; TOC will continue to follow.  -1:03 PM - Pt's wife accepted [facility]; notified Kia,Intake at [facility]; she will review documents via hub; awaiting SNF auth. -[phone] - contacted Kia , Intake at [facility]; still awaiting acceptance; will update family.    Expected Discharge Plan: Home/Self Care Barriers to Discharge: Continued Medical Work up  Expected Discharge Plan and Services Expected Discharge Plan: Home/Self Care   Discharge Planning Services: CM Consult   Living arrangements for the past 2 months: Single Family Home                                       Social Determinants of Health (SDOH) Interventions    Readmission Risk Interventions     No data to display

## 2022-03-10 DIAGNOSIS — M25561 Pain in right knee: Secondary | ICD-10-CM | POA: Diagnosis not present

## 2022-03-10 DIAGNOSIS — E43 Unspecified severe protein-calorie malnutrition: Secondary | ICD-10-CM | POA: Diagnosis not present

## 2022-03-10 DIAGNOSIS — L89626 Pressure-induced deep tissue damage of left heel: Secondary | ICD-10-CM | POA: Diagnosis not present

## 2022-03-10 DIAGNOSIS — R451 Restlessness and agitation: Secondary | ICD-10-CM | POA: Diagnosis not present

## 2022-03-10 DIAGNOSIS — R1310 Dysphagia, unspecified: Secondary | ICD-10-CM | POA: Diagnosis not present

## 2022-03-10 DIAGNOSIS — E1142 Type 2 diabetes mellitus with diabetic polyneuropathy: Secondary | ICD-10-CM | POA: Diagnosis not present

## 2022-03-10 DIAGNOSIS — R5381 Other malaise: Secondary | ICD-10-CM | POA: Diagnosis not present

## 2022-03-10 DIAGNOSIS — R4189 Other symptoms and signs involving cognitive functions and awareness: Secondary | ICD-10-CM | POA: Diagnosis not present

## 2022-03-10 DIAGNOSIS — E559 Vitamin D deficiency, unspecified: Secondary | ICD-10-CM | POA: Diagnosis not present

## 2022-03-10 DIAGNOSIS — G8321 Monoplegia of upper limb affecting right dominant side: Secondary | ICD-10-CM | POA: Diagnosis not present

## 2022-03-10 DIAGNOSIS — L89310 Pressure ulcer of right buttock, unstageable: Secondary | ICD-10-CM | POA: Diagnosis not present

## 2022-03-10 DIAGNOSIS — S82141D Displaced bicondylar fracture of right tibia, subsequent encounter for closed fracture with routine healing: Secondary | ICD-10-CM | POA: Diagnosis not present

## 2022-03-10 DIAGNOSIS — E113553 Type 2 diabetes mellitus with stable proliferative diabetic retinopathy, bilateral: Secondary | ICD-10-CM | POA: Diagnosis not present

## 2022-03-10 DIAGNOSIS — E785 Hyperlipidemia, unspecified: Secondary | ICD-10-CM | POA: Diagnosis not present

## 2022-03-10 DIAGNOSIS — S82201A Unspecified fracture of shaft of right tibia, initial encounter for closed fracture: Secondary | ICD-10-CM | POA: Diagnosis not present

## 2022-03-10 DIAGNOSIS — G894 Chronic pain syndrome: Secondary | ICD-10-CM | POA: Diagnosis not present

## 2022-03-10 DIAGNOSIS — N390 Urinary tract infection, site not specified: Secondary | ICD-10-CM | POA: Diagnosis not present

## 2022-03-10 DIAGNOSIS — E039 Hypothyroidism, unspecified: Secondary | ICD-10-CM | POA: Diagnosis not present

## 2022-03-10 DIAGNOSIS — M89662 Osteopathy after poliomyelitis, left lower leg: Secondary | ICD-10-CM | POA: Diagnosis not present

## 2022-03-10 DIAGNOSIS — R410 Disorientation, unspecified: Secondary | ICD-10-CM | POA: Diagnosis not present

## 2022-03-10 DIAGNOSIS — R319 Hematuria, unspecified: Secondary | ICD-10-CM | POA: Diagnosis not present

## 2022-03-10 DIAGNOSIS — E1165 Type 2 diabetes mellitus with hyperglycemia: Secondary | ICD-10-CM | POA: Diagnosis not present

## 2022-03-10 DIAGNOSIS — L89520 Pressure ulcer of left ankle, unstageable: Secondary | ICD-10-CM | POA: Diagnosis not present

## 2022-03-10 DIAGNOSIS — K922 Gastrointestinal hemorrhage, unspecified: Secondary | ICD-10-CM | POA: Diagnosis not present

## 2022-03-10 DIAGNOSIS — L89313 Pressure ulcer of right buttock, stage 3: Secondary | ICD-10-CM | POA: Diagnosis not present

## 2022-03-10 DIAGNOSIS — M6281 Muscle weakness (generalized): Secondary | ICD-10-CM | POA: Diagnosis not present

## 2022-03-10 DIAGNOSIS — I69341 Monoplegia of lower limb following cerebral infarction affecting right dominant side: Secondary | ICD-10-CM | POA: Diagnosis not present

## 2022-03-14 DIAGNOSIS — L89626 Pressure-induced deep tissue damage of left heel: Secondary | ICD-10-CM | POA: Diagnosis not present

## 2022-03-14 DIAGNOSIS — S82201A Unspecified fracture of shaft of right tibia, initial encounter for closed fracture: Secondary | ICD-10-CM | POA: Diagnosis not present

## 2022-03-14 DIAGNOSIS — L89310 Pressure ulcer of right buttock, unstageable: Secondary | ICD-10-CM | POA: Diagnosis not present

## 2022-03-14 DIAGNOSIS — L89520 Pressure ulcer of left ankle, unstageable: Secondary | ICD-10-CM | POA: Diagnosis not present

## 2022-03-14 DIAGNOSIS — G894 Chronic pain syndrome: Secondary | ICD-10-CM | POA: Diagnosis not present

## 2022-03-16 DIAGNOSIS — R5381 Other malaise: Secondary | ICD-10-CM | POA: Diagnosis not present

## 2022-03-16 DIAGNOSIS — R4189 Other symptoms and signs involving cognitive functions and awareness: Secondary | ICD-10-CM | POA: Diagnosis not present

## 2022-03-16 DIAGNOSIS — M25561 Pain in right knee: Secondary | ICD-10-CM | POA: Diagnosis not present

## 2022-03-16 DIAGNOSIS — G8321 Monoplegia of upper limb affecting right dominant side: Secondary | ICD-10-CM | POA: Diagnosis not present

## 2022-03-17 DIAGNOSIS — S82201A Unspecified fracture of shaft of right tibia, initial encounter for closed fracture: Secondary | ICD-10-CM | POA: Diagnosis not present

## 2022-03-17 DIAGNOSIS — E43 Unspecified severe protein-calorie malnutrition: Secondary | ICD-10-CM | POA: Diagnosis not present

## 2022-03-17 DIAGNOSIS — K922 Gastrointestinal hemorrhage, unspecified: Secondary | ICD-10-CM | POA: Diagnosis not present

## 2022-03-17 DIAGNOSIS — E785 Hyperlipidemia, unspecified: Secondary | ICD-10-CM | POA: Diagnosis not present

## 2022-03-17 DIAGNOSIS — E559 Vitamin D deficiency, unspecified: Secondary | ICD-10-CM | POA: Diagnosis not present

## 2022-03-17 DIAGNOSIS — E1165 Type 2 diabetes mellitus with hyperglycemia: Secondary | ICD-10-CM | POA: Diagnosis not present

## 2022-03-17 DIAGNOSIS — G894 Chronic pain syndrome: Secondary | ICD-10-CM | POA: Diagnosis not present

## 2022-03-17 DIAGNOSIS — E039 Hypothyroidism, unspecified: Secondary | ICD-10-CM | POA: Diagnosis not present

## 2022-03-17 NOTE — Anesthesia Postprocedure Evaluation (Signed)
Anesthesia Post Note  Patient: Mike Mack  Procedure(s) Performed: ESOPHAGOGASTRODUODENOSCOPY (EGD) BIOPSY     Patient location during evaluation: PACU Anesthesia Type: MAC Level of consciousness: awake and alert Pain management: pain level controlled Vital Signs Assessment: post-procedure vital signs reviewed and stable Respiratory status: spontaneous breathing, nonlabored ventilation, respiratory function stable and patient connected to nasal cannula oxygen Cardiovascular status: stable and blood pressure returned to baseline Postop Assessment: no apparent nausea or vomiting Anesthetic complications: no   No notable events documented.  Last Vitals:  Vitals:   03/09/22 1434 03/09/22 2113  BP: 127/71 98/86  Pulse: 79 90  Resp: 16 20  Temp: 37.2 C 37.4 C  SpO2: 98%     Last Pain:  Vitals:   03/09/22 2113  TempSrc: Oral  PainSc:                  Sarh Kirschenbaum

## 2022-03-20 DIAGNOSIS — R4189 Other symptoms and signs involving cognitive functions and awareness: Secondary | ICD-10-CM | POA: Diagnosis not present

## 2022-03-20 DIAGNOSIS — R319 Hematuria, unspecified: Secondary | ICD-10-CM | POA: Diagnosis not present

## 2022-03-20 DIAGNOSIS — N390 Urinary tract infection, site not specified: Secondary | ICD-10-CM | POA: Diagnosis not present

## 2022-03-20 DIAGNOSIS — E039 Hypothyroidism, unspecified: Secondary | ICD-10-CM | POA: Diagnosis not present

## 2022-03-20 DIAGNOSIS — R410 Disorientation, unspecified: Secondary | ICD-10-CM | POA: Diagnosis not present

## 2022-03-21 DIAGNOSIS — S82201A Unspecified fracture of shaft of right tibia, initial encounter for closed fracture: Secondary | ICD-10-CM | POA: Diagnosis not present

## 2022-03-21 DIAGNOSIS — L89520 Pressure ulcer of left ankle, unstageable: Secondary | ICD-10-CM | POA: Diagnosis not present

## 2022-03-21 DIAGNOSIS — L89310 Pressure ulcer of right buttock, unstageable: Secondary | ICD-10-CM | POA: Diagnosis not present

## 2022-03-21 DIAGNOSIS — L89626 Pressure-induced deep tissue damage of left heel: Secondary | ICD-10-CM | POA: Diagnosis not present

## 2022-03-22 DIAGNOSIS — R4189 Other symptoms and signs involving cognitive functions and awareness: Secondary | ICD-10-CM | POA: Diagnosis not present

## 2022-03-22 DIAGNOSIS — R451 Restlessness and agitation: Secondary | ICD-10-CM | POA: Diagnosis not present

## 2022-03-22 DIAGNOSIS — N390 Urinary tract infection, site not specified: Secondary | ICD-10-CM | POA: Diagnosis not present

## 2022-03-22 DIAGNOSIS — R410 Disorientation, unspecified: Secondary | ICD-10-CM | POA: Diagnosis not present

## 2022-03-23 DIAGNOSIS — R4189 Other symptoms and signs involving cognitive functions and awareness: Secondary | ICD-10-CM | POA: Diagnosis not present

## 2022-03-23 DIAGNOSIS — R451 Restlessness and agitation: Secondary | ICD-10-CM | POA: Diagnosis not present

## 2022-03-23 DIAGNOSIS — N390 Urinary tract infection, site not specified: Secondary | ICD-10-CM | POA: Diagnosis not present

## 2022-03-24 DIAGNOSIS — R451 Restlessness and agitation: Secondary | ICD-10-CM | POA: Diagnosis not present

## 2022-03-24 DIAGNOSIS — N390 Urinary tract infection, site not specified: Secondary | ICD-10-CM | POA: Diagnosis not present

## 2022-03-24 DIAGNOSIS — R4189 Other symptoms and signs involving cognitive functions and awareness: Secondary | ICD-10-CM | POA: Diagnosis not present

## 2022-03-27 DIAGNOSIS — N390 Urinary tract infection, site not specified: Secondary | ICD-10-CM | POA: Diagnosis not present

## 2022-03-27 DIAGNOSIS — R4189 Other symptoms and signs involving cognitive functions and awareness: Secondary | ICD-10-CM | POA: Diagnosis not present

## 2022-03-27 DIAGNOSIS — R451 Restlessness and agitation: Secondary | ICD-10-CM | POA: Diagnosis not present

## 2022-03-27 DIAGNOSIS — R410 Disorientation, unspecified: Secondary | ICD-10-CM | POA: Diagnosis not present

## 2022-03-28 DIAGNOSIS — E1165 Type 2 diabetes mellitus with hyperglycemia: Secondary | ICD-10-CM | POA: Diagnosis not present

## 2022-03-28 DIAGNOSIS — L89626 Pressure-induced deep tissue damage of left heel: Secondary | ICD-10-CM | POA: Diagnosis not present

## 2022-03-28 DIAGNOSIS — L89520 Pressure ulcer of left ankle, unstageable: Secondary | ICD-10-CM | POA: Diagnosis not present

## 2022-03-28 DIAGNOSIS — E559 Vitamin D deficiency, unspecified: Secondary | ICD-10-CM | POA: Diagnosis not present

## 2022-03-28 DIAGNOSIS — L89313 Pressure ulcer of right buttock, stage 3: Secondary | ICD-10-CM | POA: Diagnosis not present

## 2022-03-28 DIAGNOSIS — G894 Chronic pain syndrome: Secondary | ICD-10-CM | POA: Diagnosis not present

## 2022-03-28 DIAGNOSIS — E785 Hyperlipidemia, unspecified: Secondary | ICD-10-CM | POA: Diagnosis not present

## 2022-03-28 DIAGNOSIS — R451 Restlessness and agitation: Secondary | ICD-10-CM | POA: Diagnosis not present

## 2022-03-28 DIAGNOSIS — R4189 Other symptoms and signs involving cognitive functions and awareness: Secondary | ICD-10-CM | POA: Diagnosis not present

## 2022-03-28 DIAGNOSIS — S82201A Unspecified fracture of shaft of right tibia, initial encounter for closed fracture: Secondary | ICD-10-CM | POA: Diagnosis not present

## 2022-03-28 DIAGNOSIS — E43 Unspecified severe protein-calorie malnutrition: Secondary | ICD-10-CM | POA: Diagnosis not present

## 2022-03-30 DIAGNOSIS — I69351 Hemiplegia and hemiparesis following cerebral infarction affecting right dominant side: Secondary | ICD-10-CM | POA: Diagnosis not present

## 2022-03-30 DIAGNOSIS — D631 Anemia in chronic kidney disease: Secondary | ICD-10-CM | POA: Diagnosis not present

## 2022-03-30 DIAGNOSIS — L89313 Pressure ulcer of right buttock, stage 3: Secondary | ICD-10-CM | POA: Diagnosis not present

## 2022-03-30 DIAGNOSIS — I129 Hypertensive chronic kidney disease with stage 1 through stage 4 chronic kidney disease, or unspecified chronic kidney disease: Secondary | ICD-10-CM | POA: Diagnosis not present

## 2022-03-30 DIAGNOSIS — E1122 Type 2 diabetes mellitus with diabetic chronic kidney disease: Secondary | ICD-10-CM | POA: Diagnosis not present

## 2022-03-30 DIAGNOSIS — N1832 Chronic kidney disease, stage 3b: Secondary | ICD-10-CM | POA: Diagnosis not present

## 2022-03-30 DIAGNOSIS — N390 Urinary tract infection, site not specified: Secondary | ICD-10-CM | POA: Diagnosis not present

## 2022-03-30 DIAGNOSIS — R1310 Dysphagia, unspecified: Secondary | ICD-10-CM | POA: Diagnosis not present

## 2022-03-30 DIAGNOSIS — E1140 Type 2 diabetes mellitus with diabetic neuropathy, unspecified: Secondary | ICD-10-CM | POA: Diagnosis not present

## 2022-04-01 ENCOUNTER — Emergency Department (HOSPITAL_COMMUNITY): Payer: Medicare HMO

## 2022-04-01 ENCOUNTER — Inpatient Hospital Stay (HOSPITAL_COMMUNITY)
Admission: EM | Admit: 2022-04-01 | Discharge: 2022-04-05 | DRG: 064 | Disposition: A | Discharge status: Home Health | Payer: Medicare HMO | Attending: Internal Medicine | Admitting: Internal Medicine

## 2022-04-01 ENCOUNTER — Inpatient Hospital Stay (HOSPITAL_COMMUNITY): Payer: Medicare HMO

## 2022-04-01 DIAGNOSIS — Z87440 Personal history of urinary (tract) infections: Secondary | ICD-10-CM

## 2022-04-01 DIAGNOSIS — I639 Cerebral infarction, unspecified: Principal | ICD-10-CM | POA: Diagnosis present

## 2022-04-01 DIAGNOSIS — F0390 Unspecified dementia without behavioral disturbance: Secondary | ICD-10-CM | POA: Diagnosis present

## 2022-04-01 DIAGNOSIS — R0902 Hypoxemia: Secondary | ICD-10-CM | POA: Diagnosis not present

## 2022-04-01 DIAGNOSIS — E119 Type 2 diabetes mellitus without complications: Secondary | ICD-10-CM | POA: Diagnosis not present

## 2022-04-01 DIAGNOSIS — Z79899 Other long term (current) drug therapy: Secondary | ICD-10-CM

## 2022-04-01 DIAGNOSIS — R404 Transient alteration of awareness: Secondary | ICD-10-CM | POA: Diagnosis not present

## 2022-04-01 DIAGNOSIS — N183 Chronic kidney disease, stage 3 unspecified: Secondary | ICD-10-CM | POA: Diagnosis not present

## 2022-04-01 DIAGNOSIS — M1909 Primary osteoarthritis, other specified site: Secondary | ICD-10-CM | POA: Diagnosis present

## 2022-04-01 DIAGNOSIS — G9389 Other specified disorders of brain: Secondary | ICD-10-CM | POA: Diagnosis present

## 2022-04-01 DIAGNOSIS — E785 Hyperlipidemia, unspecified: Secondary | ICD-10-CM | POA: Diagnosis present

## 2022-04-01 DIAGNOSIS — R32 Unspecified urinary incontinence: Secondary | ICD-10-CM | POA: Diagnosis present

## 2022-04-01 DIAGNOSIS — Z66 Do not resuscitate: Secondary | ICD-10-CM | POA: Diagnosis not present

## 2022-04-01 DIAGNOSIS — N179 Acute kidney failure, unspecified: Secondary | ICD-10-CM | POA: Diagnosis not present

## 2022-04-01 DIAGNOSIS — E039 Hypothyroidism, unspecified: Secondary | ICD-10-CM | POA: Diagnosis present

## 2022-04-01 DIAGNOSIS — N390 Urinary tract infection, site not specified: Secondary | ICD-10-CM | POA: Diagnosis not present

## 2022-04-01 DIAGNOSIS — R4182 Altered mental status, unspecified: Secondary | ICD-10-CM

## 2022-04-01 DIAGNOSIS — R29810 Facial weakness: Secondary | ICD-10-CM | POA: Diagnosis present

## 2022-04-01 DIAGNOSIS — J90 Pleural effusion, not elsewhere classified: Secondary | ICD-10-CM | POA: Diagnosis not present

## 2022-04-01 DIAGNOSIS — Z993 Dependence on wheelchair: Secondary | ICD-10-CM

## 2022-04-01 DIAGNOSIS — Z20822 Contact with and (suspected) exposure to covid-19: Secondary | ICD-10-CM | POA: Diagnosis not present

## 2022-04-01 DIAGNOSIS — E11649 Type 2 diabetes mellitus with hypoglycemia without coma: Secondary | ICD-10-CM

## 2022-04-01 DIAGNOSIS — Z833 Family history of diabetes mellitus: Secondary | ICD-10-CM

## 2022-04-01 DIAGNOSIS — E1142 Type 2 diabetes mellitus with diabetic polyneuropathy: Secondary | ICD-10-CM | POA: Diagnosis present

## 2022-04-01 DIAGNOSIS — Z8249 Family history of ischemic heart disease and other diseases of the circulatory system: Secondary | ICD-10-CM

## 2022-04-01 DIAGNOSIS — F05 Delirium due to known physiological condition: Secondary | ICD-10-CM

## 2022-04-01 DIAGNOSIS — W19XXXD Unspecified fall, subsequent encounter: Secondary | ICD-10-CM | POA: Diagnosis present

## 2022-04-01 DIAGNOSIS — Z8619 Personal history of other infectious and parasitic diseases: Secondary | ICD-10-CM

## 2022-04-01 DIAGNOSIS — F0152 Vascular dementia, unspecified severity, with psychotic disturbance: Secondary | ICD-10-CM | POA: Diagnosis present

## 2022-04-01 DIAGNOSIS — F0282 Dementia in other diseases classified elsewhere, unspecified severity, with psychotic disturbance: Secondary | ICD-10-CM | POA: Diagnosis present

## 2022-04-01 DIAGNOSIS — R413 Other amnesia: Secondary | ICD-10-CM | POA: Diagnosis present

## 2022-04-01 DIAGNOSIS — Z8673 Personal history of transient ischemic attack (TIA), and cerebral infarction without residual deficits: Secondary | ICD-10-CM | POA: Diagnosis not present

## 2022-04-01 DIAGNOSIS — R410 Disorientation, unspecified: Secondary | ICD-10-CM | POA: Diagnosis not present

## 2022-04-01 DIAGNOSIS — Z794 Long term (current) use of insulin: Secondary | ICD-10-CM

## 2022-04-01 DIAGNOSIS — D631 Anemia in chronic kidney disease: Secondary | ICD-10-CM | POA: Diagnosis not present

## 2022-04-01 DIAGNOSIS — E860 Dehydration: Secondary | ICD-10-CM | POA: Diagnosis present

## 2022-04-01 DIAGNOSIS — T68XXXA Hypothermia, initial encounter: Secondary | ICD-10-CM

## 2022-04-01 DIAGNOSIS — G9341 Metabolic encephalopathy: Secondary | ICD-10-CM | POA: Diagnosis not present

## 2022-04-01 DIAGNOSIS — I69319 Unspecified symptoms and signs involving cognitive functions following cerebral infarction: Secondary | ICD-10-CM

## 2022-04-01 DIAGNOSIS — Z9842 Cataract extraction status, left eye: Secondary | ICD-10-CM

## 2022-04-01 DIAGNOSIS — G9340 Encephalopathy, unspecified: Secondary | ICD-10-CM | POA: Diagnosis not present

## 2022-04-01 DIAGNOSIS — K267 Chronic duodenal ulcer without hemorrhage or perforation: Secondary | ICD-10-CM | POA: Diagnosis present

## 2022-04-01 DIAGNOSIS — Z79890 Hormone replacement therapy (postmenopausal): Secondary | ICD-10-CM

## 2022-04-01 DIAGNOSIS — I6389 Other cerebral infarction: Secondary | ICD-10-CM | POA: Diagnosis not present

## 2022-04-01 DIAGNOSIS — Z961 Presence of intraocular lens: Secondary | ICD-10-CM | POA: Diagnosis present

## 2022-04-01 DIAGNOSIS — R4701 Aphasia: Secondary | ICD-10-CM | POA: Diagnosis present

## 2022-04-01 DIAGNOSIS — E1165 Type 2 diabetes mellitus with hyperglycemia: Secondary | ICD-10-CM | POA: Diagnosis present

## 2022-04-01 DIAGNOSIS — Z888 Allergy status to other drugs, medicaments and biological substances status: Secondary | ICD-10-CM

## 2022-04-01 DIAGNOSIS — G20C Parkinsonism, unspecified: Secondary | ICD-10-CM | POA: Diagnosis present

## 2022-04-01 DIAGNOSIS — Z8612 Personal history of poliomyelitis: Secondary | ICD-10-CM

## 2022-04-01 DIAGNOSIS — Z7401 Bed confinement status: Secondary | ICD-10-CM

## 2022-04-01 DIAGNOSIS — S82201A Unspecified fracture of shaft of right tibia, initial encounter for closed fracture: Secondary | ICD-10-CM

## 2022-04-01 DIAGNOSIS — Z87442 Personal history of urinary calculi: Secondary | ICD-10-CM

## 2022-04-01 DIAGNOSIS — I10 Essential (primary) hypertension: Secondary | ICD-10-CM | POA: Diagnosis not present

## 2022-04-01 DIAGNOSIS — I959 Hypotension, unspecified: Secondary | ICD-10-CM | POA: Diagnosis not present

## 2022-04-01 DIAGNOSIS — E1122 Type 2 diabetes mellitus with diabetic chronic kidney disease: Secondary | ICD-10-CM | POA: Diagnosis present

## 2022-04-01 DIAGNOSIS — E162 Hypoglycemia, unspecified: Secondary | ICD-10-CM | POA: Diagnosis not present

## 2022-04-01 DIAGNOSIS — L89313 Pressure ulcer of right buttock, stage 3: Secondary | ICD-10-CM | POA: Diagnosis present

## 2022-04-01 DIAGNOSIS — Z7189 Other specified counseling: Secondary | ICD-10-CM | POA: Diagnosis not present

## 2022-04-01 DIAGNOSIS — Z9841 Cataract extraction status, right eye: Secondary | ICD-10-CM

## 2022-04-01 DIAGNOSIS — I129 Hypertensive chronic kidney disease with stage 1 through stage 4 chronic kidney disease, or unspecified chronic kidney disease: Secondary | ICD-10-CM | POA: Diagnosis present

## 2022-04-01 DIAGNOSIS — S82201D Unspecified fracture of shaft of right tibia, subsequent encounter for closed fracture with routine healing: Secondary | ICD-10-CM

## 2022-04-01 DIAGNOSIS — K2971 Gastritis, unspecified, with bleeding: Secondary | ICD-10-CM | POA: Diagnosis not present

## 2022-04-01 DIAGNOSIS — E440 Moderate protein-calorie malnutrition: Secondary | ICD-10-CM

## 2022-04-01 DIAGNOSIS — F03C11 Unspecified dementia, severe, with agitation: Secondary | ICD-10-CM | POA: Diagnosis not present

## 2022-04-01 DIAGNOSIS — N1831 Chronic kidney disease, stage 3a: Secondary | ICD-10-CM | POA: Diagnosis present

## 2022-04-01 DIAGNOSIS — R29818 Other symptoms and signs involving the nervous system: Secondary | ICD-10-CM | POA: Diagnosis not present

## 2022-04-01 DIAGNOSIS — G459 Transient cerebral ischemic attack, unspecified: Secondary | ICD-10-CM | POA: Diagnosis not present

## 2022-04-01 DIAGNOSIS — M6281 Muscle weakness (generalized): Secondary | ICD-10-CM

## 2022-04-01 DIAGNOSIS — H409 Unspecified glaucoma: Secondary | ICD-10-CM | POA: Diagnosis present

## 2022-04-01 DIAGNOSIS — R4189 Other symptoms and signs involving cognitive functions and awareness: Secondary | ICD-10-CM | POA: Diagnosis not present

## 2022-04-01 DIAGNOSIS — E876 Hypokalemia: Secondary | ICD-10-CM | POA: Diagnosis present

## 2022-04-01 DIAGNOSIS — D649 Anemia, unspecified: Secondary | ICD-10-CM | POA: Diagnosis not present

## 2022-04-01 DIAGNOSIS — N400 Enlarged prostate without lower urinary tract symptoms: Secondary | ICD-10-CM | POA: Diagnosis present

## 2022-04-01 DIAGNOSIS — Z515 Encounter for palliative care: Secondary | ICD-10-CM | POA: Diagnosis not present

## 2022-04-01 DIAGNOSIS — K922 Gastrointestinal hemorrhage, unspecified: Secondary | ICD-10-CM

## 2022-04-01 HISTORY — DX: Hypothermia, initial encounter: T68.XXXA

## 2022-04-01 LAB — RAPID URINE DRUG SCREEN, HOSP PERFORMED
Amphetamines: NOT DETECTED
Barbiturates: NOT DETECTED
Benzodiazepines: NOT DETECTED
Cocaine: NOT DETECTED
Opiates: NOT DETECTED
Tetrahydrocannabinol: NOT DETECTED

## 2022-04-01 LAB — COMPREHENSIVE METABOLIC PANEL
ALT: 14 U/L (ref 0–44)
AST: 24 U/L (ref 15–41)
Albumin: 2.7 g/dL — ABNORMAL LOW (ref 3.5–5.0)
Alkaline Phosphatase: 124 U/L (ref 38–126)
Anion gap: 9 (ref 5–15)
BUN: 30 mg/dL — ABNORMAL HIGH (ref 8–23)
CO2: 22 mmol/L (ref 22–32)
Calcium: 8.7 mg/dL — ABNORMAL LOW (ref 8.9–10.3)
Chloride: 106 mmol/L (ref 98–111)
Creatinine, Ser: 1.99 mg/dL — ABNORMAL HIGH (ref 0.61–1.24)
GFR, Estimated: 33 mL/min — ABNORMAL LOW (ref >60)
Glucose, Bld: 189 mg/dL — ABNORMAL HIGH (ref 70–99)
Potassium: 4.3 mmol/L (ref 3.5–5.1)
Sodium: 137 mmol/L (ref 135–145)
Total Bilirubin: 0.8 mg/dL (ref 0.3–1.2)
Total Protein: 7 g/dL (ref 6.5–8.1)

## 2022-04-01 LAB — PROTIME-INR
INR: 1.3 — ABNORMAL HIGH (ref 0.8–1.2)
Prothrombin Time: 15.6 seconds — ABNORMAL HIGH (ref 11.4–15.2)

## 2022-04-01 LAB — I-STAT CHEM 8, ED
BUN: 35 mg/dL — ABNORMAL HIGH (ref 8–23)
Calcium, Ion: 1.12 mmol/L — ABNORMAL LOW (ref 1.15–1.40)
Chloride: 105 mmol/L (ref 98–111)
Creatinine, Ser: 1.9 mg/dL — ABNORMAL HIGH (ref 0.61–1.24)
Glucose, Bld: 188 mg/dL — ABNORMAL HIGH (ref 70–99)
HCT: 36 % — ABNORMAL LOW (ref 39.0–52.0)
Hemoglobin: 12.2 g/dL — ABNORMAL LOW (ref 13.0–17.0)
Potassium: 4.6 mmol/L (ref 3.5–5.1)
Sodium: 140 mmol/L (ref 135–145)
TCO2: 24 mmol/L (ref 22–32)

## 2022-04-01 LAB — CBC
HCT: 34 % — ABNORMAL LOW (ref 39.0–52.0)
Hemoglobin: 10.6 g/dL — ABNORMAL LOW (ref 13.0–17.0)
MCH: 31.3 pg (ref 26.0–34.0)
MCHC: 31.2 g/dL (ref 30.0–36.0)
MCV: 100.3 fL — ABNORMAL HIGH (ref 80.0–100.0)
Platelets: 340 10*3/uL (ref 150–400)
RBC: 3.39 MIL/uL — ABNORMAL LOW (ref 4.22–5.81)
RDW: 17 % — ABNORMAL HIGH (ref 11.5–15.5)
WBC: 10.9 10*3/uL — ABNORMAL HIGH (ref 4.0–10.5)
nRBC: 0 % (ref 0.0–0.2)

## 2022-04-01 LAB — URINALYSIS, ROUTINE W REFLEX MICROSCOPIC
Bacteria, UA: NONE SEEN
Bilirubin Urine: NEGATIVE
Glucose, UA: NEGATIVE mg/dL
Ketones, ur: 5 mg/dL — AB
Nitrite: NEGATIVE
Protein, ur: 100 mg/dL — AB
RBC / HPF: >50 RBC/hpf — ABNORMAL HIGH (ref 0–5)
Specific Gravity, Urine: 1.023 (ref 1.005–1.030)
WBC, UA: >50 WBC/hpf — ABNORMAL HIGH (ref 0–5)
pH: 5 (ref 5.0–8.0)

## 2022-04-01 LAB — RESP PANEL BY RT-PCR (FLU A&B, COVID) ARPGX2
Influenza A by PCR: NEGATIVE
Influenza B by PCR: NEGATIVE
SARS Coronavirus 2 by RT PCR: NEGATIVE

## 2022-04-01 LAB — DIFFERENTIAL
Abs Immature Granulocytes: 0.1 10*3/uL — ABNORMAL HIGH (ref 0.00–0.07)
Basophils Absolute: 0 10*3/uL (ref 0.0–0.1)
Basophils Relative: 0 %
Eosinophils Absolute: 0 10*3/uL (ref 0.0–0.5)
Eosinophils Relative: 0 %
Immature Granulocytes: 1 %
Lymphocytes Relative: 12 %
Lymphs Abs: 1.3 10*3/uL (ref 0.7–4.0)
Monocytes Absolute: 0.4 10*3/uL (ref 0.1–1.0)
Monocytes Relative: 4 %
Neutro Abs: 9.1 10*3/uL — ABNORMAL HIGH (ref 1.7–7.7)
Neutrophils Relative %: 83 %

## 2022-04-01 LAB — APTT: aPTT: 29 seconds (ref 24–36)

## 2022-04-01 LAB — VITAMIN B12: Vitamin B-12: 1169 pg/mL — ABNORMAL HIGH (ref 180–914)

## 2022-04-01 LAB — CBG MONITORING, ED: Glucose-Capillary: 150 mg/dL — ABNORMAL HIGH (ref 70–99)

## 2022-04-01 LAB — ETHANOL: Alcohol, Ethyl (B): <10 mg/dL (ref <10)

## 2022-04-01 LAB — LACTIC ACID, PLASMA: Lactic Acid, Venous: 1.8 mmol/L (ref 0.5–1.9)

## 2022-04-01 LAB — AMMONIA: Ammonia: 20 umol/L (ref 9–35)

## 2022-04-01 LAB — MAGNESIUM: Magnesium: 2 mg/dL (ref 1.7–2.4)

## 2022-04-01 MED ORDER — LEVOTHYROXINE SODIUM 88 MCG PO TABS
88.0000 ug | ORAL_TABLET | Freq: Every day | ORAL | Status: DC
  Administered 2022-04-03 – 2022-04-05 (×3): 88 ug via ORAL
  Filled 2022-04-01 (×4): qty 1

## 2022-04-01 MED ORDER — LATANOPROST 0.005 % OP SOLN
1.0000 [drp] | Freq: Every day | OPHTHALMIC | Status: DC
  Administered 2022-04-02 – 2022-04-04 (×3): 1 [drp] via OPHTHALMIC
  Filled 2022-04-01: qty 2.5

## 2022-04-01 MED ORDER — ACETAMINOPHEN 160 MG/5ML PO SOLN: 650.0000 mg | ORAL | Status: DC | PRN

## 2022-04-01 MED ORDER — STROKE: EARLY STAGES OF RECOVERY BOOK: Freq: Once | Status: DC

## 2022-04-01 MED ORDER — ENOXAPARIN SODIUM 40 MG/0.4ML IJ SOSY: 40.0000 mg | PREFILLED_SYRINGE | INTRAMUSCULAR | Status: DC

## 2022-04-01 MED ORDER — SENNOSIDES-DOCUSATE SODIUM 8.6-50 MG PO TABS: 1.0000 | ORAL_TABLET | Freq: Every evening | ORAL | Status: DC | PRN

## 2022-04-01 MED ORDER — SODIUM CHLORIDE 0.9 % IV SOLN: INTRAVENOUS | Status: DC

## 2022-04-01 MED ORDER — BRIMONIDINE TARTRATE 0.2 % OP SOLN
1.0000 [drp] | Freq: Three times a day (TID) | OPHTHALMIC | Status: DC
  Administered 2022-04-02 – 2022-04-05 (×8): 1 [drp] via OPHTHALMIC
  Filled 2022-04-01: qty 5

## 2022-04-01 MED ORDER — DORZOLAMIDE HCL-TIMOLOL MAL 2-0.5 % OP SOLN: 1.0000 [drp] | Freq: Two times a day (BID) | OPHTHALMIC | Status: DC

## 2022-04-01 MED ORDER — SODIUM CHLORIDE 0.9 % IV SOLN
2.0000 g | INTRAVENOUS | Status: DC
  Administered 2022-04-01 – 2022-04-04 (×4): 2 g via INTRAVENOUS
  Filled 2022-04-01 (×5): qty 20

## 2022-04-01 MED ORDER — ACETAMINOPHEN 325 MG PO TABS: 650.0000 mg | ORAL_TABLET | ORAL | Status: DC | PRN

## 2022-04-01 MED ORDER — LACTATED RINGERS IV SOLN: INTRAVENOUS | Status: DC

## 2022-04-01 MED ORDER — SODIUM CHLORIDE 0.9 % IV SOLN: 2.0000 g | INTRAVENOUS | Status: DC

## 2022-04-01 MED ORDER — ACETAMINOPHEN 650 MG RE SUPP: 650.0000 mg | RECTAL | Status: DC | PRN

## 2022-04-01 MED ORDER — PANTOPRAZOLE SODIUM 40 MG IV SOLR
40.0000 mg | Freq: Two times a day (BID) | INTRAVENOUS | Status: DC
  Administered 2022-04-02 – 2022-04-03 (×2): 40 mg via INTRAVENOUS
  Filled 2022-04-01 (×2): qty 10

## 2022-04-01 MED ORDER — NETARSUDIL DIMESYLATE 0.02 % OP SOLN: 1.0000 [drp] | Freq: Every day | OPHTHALMIC | Status: DC

## 2022-04-01 NOTE — Progress Notes (Addendum)
Elink is following this code sepsis. After several attempts, one blood culture obtained. PA is aware. No IV at this point to receive abx.

## 2022-04-01 NOTE — Assessment & Plan Note (Signed)
Continue flomax  

## 2022-04-01 NOTE — Assessment & Plan Note (Signed)
delirium precautions Continue namenda and remeron

## 2022-04-01 NOTE — Assessment & Plan Note (Addendum)
S/p EGD which showed gastric and duodenal ulcers and erosion without active bleeding. S/p 1 unit RBC ASA was held at this time  Has not been taking his PPI '40mg'$  BID x 6 weeks Starting IV while NPO

## 2022-04-01 NOTE — Assessment & Plan Note (Addendum)
86 year old male presenting with 1 day history of acute encephalopathy, becoming unresponsive and slumped over in his WC mid-morning -admit to progressive -neuro vs. Infectious -MRI shows possible tiny subacute infarct in posterior frontal white matter vs. Artifact and he has gaze preference on exam  -neurology consulted, eeg ordered  -neurochecks per protocol -swallow screen -? If ASA ever started back at hospital d/c as note stated f/u with neurologist  -hypothermic, but no other criteria for sepsis. Urine does look infected and has hx of frequent UTIs this will be his 3rd since September; however urine culture form 9/12 did not grow any bacteria  -pan cultured, continue rocephin/IVF -check metabolic labs as he is also FTT -protecting airway

## 2022-04-01 NOTE — Assessment & Plan Note (Addendum)
Soft readings, hold norvasc and lopressor and benicar  Allow for permissive Htn in setting of acute CVA  Continue to monitor

## 2022-04-01 NOTE — Consult Note (Addendum)
Neurology Consultation Reason for Consult: Code stroke for aphasia Requesting Physician: Regan Lemming  CC: Slumped over  History is obtained from: Wife at bedside and ED provider  HPI: Mike Mack is a 86 y.o. 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  Ate breakfast normally at 6:30 AM.  At 10:30 AM she noticed he was slumped over and initially thought he was napping.  Subsequently she realized he was not able to speak and he was brought to the ED for further evaluation.  Per last neurology note from March at baseline he is oriented to self and place.  He needs help with bathing per wife  He was noted to be hypotensive by EMS and given IV fluids, remaining hypotensive, nonverbal, hypothermic  LKW: 6:30 AM Thrombolytic given?: No, out of the window and recent GI bleed  IA performed?: No, o Premorbid modified rankin scale:      4 - Moderately severe disability. Unable to attend to own bodily needs without assistance, and unable to walk unassisted.  ROS: Unable to obtain due to altered mental status.   Past Medical History:  Diagnosis Date   Anemia 05/13/2013   Arthritis    "right leg" (07/25/2013)   BPH (benign prostatic hyperplasia)    Constipation 05/13/2013   Exertional shortness of breath    "sometimes" (07/25/2013)   History of kidney stones    History of stomach ulcers    Hypertension    Neuropathy    Polio     Polio Childhood   Small bowel obstruction ([place])    Stroke ([place]) 2014   residual:  "left hand kind of numb" (07/25/2013)   Type II diabetes mellitus ([place])    Past Surgical History:  Procedure Laterality Date   BIOPSY  11/30/2020   Procedure: BIOPSY;  Surgeon: Ronnette Juniper, MD;  Location: [facility];  Service: Gastroenterology;;   BIOPSY  03/03/2022   Procedure: BIOPSY;  Surgeon: Clarene Essex, MD;  Location: [facility];  Service: Gastroenterology;;   BOWEL RESECTION     CATARACT EXTRACTION W/ INTRAOCULAR LENS  IMPLANT, BILATERAL Bilateral 2000's   CHOLECYSTECTOMY N/A 07/25/2013   Procedure: LAPAROSCOPIC CHOLECYSTECTOMY WITH INTRAOPERATIVE CHOLANGIOGRAM;  Surgeon: [name], MD;  Location: [facility];  Service: General;  Laterality: N/A;   COLON SURGERY     COLONOSCOPY     Hx: of   CYSTOSCOPY WITH BIOPSY N/A 12/01/2019   Procedure: CYSTOSCOPY WITH BLADDER BIOPSY AND FULGURATION;  Surgeon: Lucas Mallow, MD;  Location: [facility];  Service: Urology;  Laterality: N/A;   ESOPHAGOGASTRODUODENOSCOPY N/A 11/30/2020   Procedure: ESOPHAGOGASTRODUODENOSCOPY (EGD);  Surgeon: Ronnette Juniper, MD;  Location: [facility];  Service: Gastroenterology;  Laterality: N/A;   ESOPHAGOGASTRODUODENOSCOPY N/A 03/03/2022   Procedure: ESOPHAGOGASTRODUODENOSCOPY (EGD);  Surgeon: Clarene Essex, MD;  Location: [facility];  Service: Gastroenterology;  Laterality: N/A;   EXCISIONAL HEMORRHOIDECTOMY  2000's   EXTRACORPOREAL SHOCK WAVE LITHOTRIPSY Right 10/03/2018   Procedure: EXTRACORPOREAL SHOCK WAVE LITHOTRIPSY (ESWL);  Surgeon: Festus Aloe, MD;  Location: [facility];  Service: Urology;  Laterality: Right;   EYE SURGERY     INGUINAL HERNIA REPAIR Right 1970's   IR RADIOLOGIST EVAL & MGMT  04/06/2020   LAPAROSCOPIC CHOLECYSTECTOMY  07/25/2013   w/LOA (07/25/2013)   Current Outpatient Medications  Medication Instructions   acetaminophen (TYLENOL) 650 mg, Oral, Every 6 hours PRN   atorvastatin (LIPITOR) 20 mg, Oral, Every  evening   B Complex Vitamins (B COMPLEX PO) 1 tablet, Oral, Daily at bedtime   brimonidine (ALPHAGAN) 0.2 % ophthalmic solution 1 drop, Both Eyes, 3 times daily   cholecalciferol (VITAMIN D3) 400 Units, Oral, Daily   docusate sodium (COLACE) 100 mg, Oral, 2 times daily PRN   dorzolamide-timolol (COSOPT) 22.3-6.8 MG/ML ophthalmic solution 1 drop, Both Eyes, 2 times daily   feeding supplement, GLUCERNA SHAKE, (GLUCERNA SHAKE) LIQD 237 mLs, Oral, 3 times daily between meals   HYDROcodone-acetaminophen (NORCO/VICODIN) 5-325 MG tablet 1 tablet, Oral, Every 4 hours PRN   insulin aspart (NOVOLOG) 0-9 Units, Subcutaneous, 3 times daily with meals   latanoprost (XALATAN) 0.005 % ophthalmic solution 1 drop, Both Eyes, Daily at bedtime   levothyroxine (SYNTHROID) 88 mcg, Oral, Daily at bedtime   memantine (NAMENDA) 10 mg, Oral, 2 times daily   mirtazapine (REMERON SOL-TAB) 15 mg, Oral, Daily at bedtime   Multiple Vitamin (MULTIVITAMIN WITH MINERALS) TABS tablet 1 tablet, Oral, Daily   ondansetron (ZOFRAN-ODT) 8 mg, Oral, Every 8 hours PRN   pantoprazole (PROTONIX) 40 mg, Oral, 2 times daily before meals   polyethylene glycol (MIRALAX / GLYCOLAX) 17 g, Oral, Daily   RHOPRESSA 0.02 % SOLN 1 drop, Both Eyes, Daily at bedtime   tamsulosin (FLOMAX) 0.4 mg, Oral, 2 times daily     Family History  Problem Relation Age of Onset   Heart failure Mother    Hypertension Sister    Diabetes Sister    Diabetes Brother    Hypertension Brother     Social History:  reports that he has never smoked. He has never used smokeless tobacco. He reports that he does not drink alcohol and does not use drugs.   Exam: Current vital signs: BP 109/64  Vital signs in last 24 hours: BP: (109)/(64) 109/64 (10/14 1237)   Physical Exam  Constitutional: Appears chronically ill and frail Psych: Minimally interactive Eyes: No scleral injection HENT: No oropharyngeal obstruction.  MSK: no joint deformities.   Cardiovascular: Hypotensive, extremities cool Respiratory: Effort normal, non-labored breathing GI: Soft.  No distension. There is no tenderness.  Skin: Warm dry and intact visible skin  Neuro: Mental Status: Patient is drowsy, intermittently orients to examiner but otherwise not interactive, not following commands, nonverbal Cranial Nerves: II: Visual Fields are difficult to assess due to forced eye closure. Pupils are equal and round on very limited evaluation III,IV, VI: Right gaze preference that is quite marked.  Occasionally gaze is midline.  With great effort briefly observed to cross to the left V: Facial sensation is symmetric to light eyelash brush VII: Facial movement is notable for right facial droop.  VIII: hearing is intact to voice Remainder unable to assess due to mental status Motor: Paratonia throughout.  Decreased bulk throughout.  Bilateral upper extremities 4/5 based on paratonia.  Right lower extremity rotated in abduction and only trace movement to noxious stimulation.  Left lower extremity 2/5 to noxious stimulation Sensory: Appears grossly equally reactive to noxious stimulation in all 4 extremities Deep Tendon Reflexes: Absent patellar's, 1+ brachioradialis  Cerebellar: Unable to assess secondary to patient's mental  status  Gait:  Nonambulatory at baseline  I have reviewed labs in epic and the results pertinent to this consultation are:  Basic Metabolic Panel: Recent Labs  Lab 04/01/22 1254  NA 140  K 4.6  CL 105  GLUCOSE 188*  BUN 35*  CREATININE 1.90*    CBC: Recent Labs  Lab 04/01/22 1250 04/01/22 1254  WBC 10.9*  --   NEUTROABS 9.1*  --   HGB 10.6* 12.2*  HCT 34.0* 36.0*  MCV 100.3*  --   PLT 340  --     Coagulation Studies: Recent Labs    04/01/22 1250  LABPROT 15.6*  INR 1.3*      I have reviewed the images obtained: Head CT personally reviewed, agree with radiology no acute intracranial process Communicating  hydrocephalus, unchanged. Correlate with symptoms of normal pressure hydrocephalus. Chronic ischemic changes in the white matter and cerebellum bilaterally. No acute infarct or hemorrhage.  Impression: Most likely toxic/metabolic process given hypothermia, hypotension, AKI, however cannot exclude stroke or seizure based on the patient's limited clinical examination and gaze preference  Recommendations: -MRI brain without contrast -Routine EEG -Work-up for possible NPH on an outpatient basis, not appropriate in an acute setting  Lesleigh Noe MD-PhD [facility] [phone] Available 7 AM to 7 PM, outside these hours please contact Neurologist on call listed on AMION   Total critical care time: 30 minutes   Critical care time was exclusive of separately billable procedures and treating other patients.   Critical care was necessary to treat or prevent imminent or life-threatening deterioration, emergent evaluation for potential treatment with thrombolytic or intra-arterial thrombectomy.   Critical care was time spent personally by me on the following activities: development of treatment plan with patient and/or surrogate as well as nursing, discussions with consultants/primary team, evaluation of patient's response to treatment, examination of patient, obtaining history from patient or surrogate, ordering and performing treatments and interventions, ordering and review of laboratory studies, ordering and review of radiographic studies, and re-evaluation of patient's condition as needed, as documented above.     Addendum: MRI brain reviewed, I do favor that this represents an infarct.  Patient has been off of even aspirin secondary to recent significant GI bleed.  Will defer initiation of antiplatelet agents at this time.  -A1c, lipid panel, echocardiogram and carotid duplex to complete work-up -Stroke team to follow in consultation

## 2022-04-01 NOTE — Assessment & Plan Note (Signed)
Baseline creatinine around 1.8, stable Continue to monitor

## 2022-04-01 NOTE — ED Notes (Signed)
Pt refused to do swallow screen. MD made aware.

## 2022-04-01 NOTE — ED Provider Notes (Signed)
86 yo male with AMS, responding to pain only. Last known well at 6:30am when he ate breakfast. Called EMS by wife when found slumped over.  Seen by neuro- will check MRI, MRA, EEG, consider toxic/metabolic  Sepsis- hypothermic- under warmer, given rocephin. Plan to admit.  1.5L NS ordered already Physical Exam  BP 110/63   Pulse 66   Temp (!) 96.2 F (35.7 C) (Rectal)   Resp 16   SpO2 100%   Physical Exam  Procedures  Procedures  ED Course / MDM   Clinical Course as of 04/01/22 1653  Sat Apr 01, 2022  1300 Neurologist at bedside [SB]  1300 Wife arrived to the emergency department and noted that at 6:30 AM patient ate his breakfast and was at his baseline and was speaking with her without difficulty.  Wife notes that around 11:30 AM she went to check on the patient who was sitting in his wheelchair slumped over when he was unresponsive and not at his baseline. [SB]    Clinical Course User Index [SB] Blue, Soijett A, PA-C   Medical Decision Making Amount and/or Complexity of Data Reviewed Labs: ordered. Radiology: ordered.  Risk Prescription drug management. Decision regarding hospitalization.  Discussed results and plan of care with patient's wife who notes patient is talking more at this point however not making sense. Case discussed with Dr. Rogers Blocker with [facility] who will consult for admission.       Tacy Learn, PA-C 04/01/22 1653    Noemi Chapel, MD 04/04/22 [id number]

## 2022-04-01 NOTE — H&P (Signed)
History and Physical    Patient: Mike Mack GUY:[id number] DOB: [birth year] DOA: 04/01/2022 DOS: the patient was seen and examined on 04/01/2022 PCP: [name], MD  Patient coming from: Home - lives with his wife and grandson. Bedbound.    Chief Complaint: AMS  HPI: Mike Mack is a 86 y.o. male with medical history significant of T2DM, HTN, hx of CVA, memory loss, CKD stage 3, polio  Who presented to ED with AMS. His wife gave him his breakfast at 6:30am and she states he was his normal self. At 8am she got him up and dressed and in his WC.  She went to go check on him around 11-11:30 and he was slumped over and not talking to her. Ems was called.   At baseline he carries on a normal conversation.  He knows family members, doesn't know date and sometimes confused as to where he is. Needs help with all ADLs. He does feed himself.    He just came home from rehab on Wednesday.  He has had no fever/chills. He told her yesterday his stomach was bothering him a little bit. No V/D. He also complained of dysuria. He has poor PO intake.   Just admitted to [facility] from 9/12-9/21/23 for septic shock secondary to UTI and required stay in ICU on pressors. Also had acute on chronic renal failure and GI bleeding. EGD showed gastric and duodenal ulcers and erosion without active bleeding. S/p 1 unit PRBC.   He does not smoke or drink alcohol.   ER Course:  vitals: temp: 96.2, bp: 107/61, HR: 66, RR: 13, oxygen: 100%RA Pertinent labs: wbc: 10.9, hgb: 10.6, BUN: 30, creatinine: 1.99,  CXR: trace bilateral pleural effusions CT head: no acute process MRI/MRA brain: punctate focus of DWI hyperintensity in the right posterior frontal white matter without definite ADC correlate. Could be tiny subacute infarct vs. Artifact. Similar ventriculomegaly,central predominant atrophy vs. NPH. Significant motion limited study without evidence of proximal vessel occlusion.  In ED: code stroke and code sepsis  initiated. Given rocephin and started on IVF.    Review of Systems: unable to review all systems due to the inability of the patient to answer questions. Past Medical History:  Diagnosis Date   Anemia 05/13/2013   Arthritis    "right leg" (07/25/2013)   BPH (benign prostatic hyperplasia)    Constipation 05/13/2013   Exertional shortness of breath    "sometimes" (07/25/2013)   History of kidney stones    History of stomach ulcers    Hypertension    Neuropathy    Polio    Polio Childhood   Small bowel obstruction ([place])    Stroke ([place]) 2014   residual:  "left hand kind of numb" (07/25/2013)   Type II diabetes mellitus ([place])    Past Surgical History:  Procedure Laterality Date   BIOPSY  11/30/2020   Procedure: BIOPSY;  Surgeon: Ronnette Juniper, MD;  Location: [place];  Service: Gastroenterology;;   BIOPSY  03/03/2022   Procedure: BIOPSY;  Surgeon: Clarene Essex, MD;  Location: [facility];  Service: Gastroenterology;;   BOWEL RESECTION     CATARACT EXTRACTION W/ INTRAOCULAR LENS  IMPLANT, BILATERAL Bilateral 2000's   CHOLECYSTECTOMY N/A 07/25/2013   Procedure: LAPAROSCOPIC CHOLECYSTECTOMY WITH INTRAOPERATIVE CHOLANGIOGRAM;  Surgeon: [name], MD;  Location: [place];  Service: General;  Laterality: N/A;   COLON SURGERY     COLONOSCOPY     Hx: of   CYSTOSCOPY WITH BIOPSY N/A 12/01/2019   Procedure: CYSTOSCOPY  WITH BLADDER BIOPSY AND FULGURATION;  Surgeon: Lucas Mallow, MD;  Location: [facility];  Service: Urology;  Laterality: N/A;   ESOPHAGOGASTRODUODENOSCOPY N/A 11/30/2020   Procedure: ESOPHAGOGASTRODUODENOSCOPY (EGD);  Surgeon: Ronnette Juniper, MD;  Location: [place];  Service: Gastroenterology;  Laterality: N/A;   ESOPHAGOGASTRODUODENOSCOPY N/A 03/03/2022   Procedure: ESOPHAGOGASTRODUODENOSCOPY (EGD);  Surgeon: Clarene Essex, MD;  Location: [facility];  Service: Gastroenterology;  Laterality: N/A;   EXCISIONAL HEMORRHOIDECTOMY  2000's   EXTRACORPOREAL SHOCK WAVE LITHOTRIPSY  Right 10/03/2018   Procedure: EXTRACORPOREAL SHOCK WAVE LITHOTRIPSY (ESWL);  Surgeon: Festus Aloe, MD;  Location: [facility];  Service: Urology;  Laterality: Right;   EYE SURGERY     INGUINAL HERNIA REPAIR Right 1970's   IR RADIOLOGIST EVAL & MGMT  04/06/2020   LAPAROSCOPIC CHOLECYSTECTOMY  07/25/2013   w/LOA (07/25/2013)   Social History:  reports that he has never smoked. He has never used smokeless tobacco. He reports that he does not drink alcohol and does not use drugs.  Allergies  Allergen Reactions   Metformin And Related Other (See Comments)    Gi intolerance   [name] [Insulin Glargine-Lixisenatide] Other (See Comments)    Stomach cramps    Family History  Problem Relation Age of Onset   Heart failure Mother    Hypertension Sister    Diabetes Sister    Diabetes Brother    Hypertension Brother     Prior to Admission medications   Medication Sig Start Date End Date Taking? Authorizing Provider  acetaminophen (TYLENOL) 325 MG tablet Take 2 tablets (650 mg total) by mouth every 6 (six) hours as needed for mild pain (or Fever >/= 101). 11/27/20   Debbe Odea, MD  atorvastatin (LIPITOR) 20 MG tablet Take 20 mg by mouth every evening.  11/03/13   [provider]  B Complex Vitamins (B COMPLEX PO) Take 1 tablet by mouth at bedtime.    [provider]  brimonidine (ALPHAGAN) 0.2 % ophthalmic solution Place 1 drop into both eyes 3 (three) times daily.    [provider]  cholecalciferol (VITAMIN D3) 10 MCG (400 UNIT) TABS tablet Take 400 Units by mouth daily.    [provider]  docusate sodium (COLACE) 100 MG capsule Take 1 capsule (100 mg total) by mouth 2 (two) times daily as needed for mild constipation. 03/09/22   Geradine Girt, DO  dorzolamide-timolol (COSOPT) 22.3-6.8 MG/ML ophthalmic solution Place 1 drop into both eyes 2 (two) times daily. 03/04/20   [provider]  feeding supplement, GLUCERNA SHAKE, (GLUCERNA SHAKE) LIQD Take  237 mLs by mouth 3 (three) times daily between meals. Patient taking differently: Take 237 mLs by mouth 2 (two) times daily between meals. 03/25/20   Antonieta Pert, MD  HYDROcodone-acetaminophen (NORCO/VICODIN) 5-325 MG tablet Take 1 tablet by mouth every 4 (four) hours as needed for moderate pain. 03/09/22   Geradine Girt, DO  insulin aspart (NOVOLOG) 100 UNIT/ML injection Inject 0-9 Units into the skin 3 (three) times daily with meals. 03/09/22   Geradine Girt, DO  latanoprost (XALATAN) 0.005 % ophthalmic solution Place 1 drop into both eyes at bedtime.  11/17/14   [provider]  levothyroxine (SYNTHROID) 88 MCG tablet Take 88 mcg by mouth at bedtime.  04/21/11   [provider]  memantine (NAMENDA) 10 MG tablet Take 10 mg by mouth in the morning and at bedtime. 11/04/20   [provider]  mirtazapine (REMERON SOL-TAB) 15 MG disintegrating tablet Take 1 tablet (15  mg total) by mouth at bedtime. 03/09/22   Geradine Girt, DO  Multiple Vitamin (MULTIVITAMIN WITH MINERALS) TABS tablet Take 1 tablet by mouth daily. 09/06/13   [name], MD  ondansetron (ZOFRAN-ODT) 8 MG disintegrating tablet Take 1 tablet (8 mg total) by mouth every 8 (eight) hours as needed for nausea. Patient not taking: Reported on 02/28/2022 08/28/21   Varney Biles, MD  pantoprazole (PROTONIX) 40 MG tablet Take 1 tablet (40 mg total) by mouth 2 (two) times daily before a meal. Patient taking differently: Take 40 mg by mouth in the morning and at bedtime. 12/09/20   Lavina Hamman, MD  polyethylene glycol [facility] Floria Raveling) packet Take 17 g by mouth daily. Patient taking differently: Take 17 g by mouth daily as needed for mild constipation. 09/06/13   [name], MD  RHOPRESSA 0.02 % SOLN Place 1 drop into both eyes at bedtime. 03/05/20   [provider]  Tamsulosin HCl (FLOMAX) 0.4 MG CAPS Take 0.4 mg by mouth in the morning and at bedtime.    [provider]     Physical Exam: Vitals:   04/01/22 1330 04/01/22 1452 04/01/22 1515 04/01/22 1615  BP:  113/87 99/71 110/63  Pulse:  66 66   Resp:  '12 13 16  '$ Temp: (!) 96.2 F (35.7 C)     TempSrc: Rectal     SpO2:  100% 100% 100%   General:  Appears calm and comfortable and is in NAD Eyes:  PERRL, EOMI, normal lids, iris. Right sided gaze preference  ENT:  grossly normal hearing, lips & tongue, mmm; appropriate dentition Neck:  no LAD, masses or thyromegaly; no carotid bruits Cardiovascular:  RRR, no m/r/g. No LE edema.  Respiratory:   CTA bilaterally with no wheezes/rales/rhonchi.  Normal respiratory effort. Abdomen:  soft, NT, ND, NABS Back:   normal alignment, no CVAT Skin:  no rash or induration seen on limited exam. Small pressure ulcer that is open, no drainage or surrounding erythema on sacrum  Musculoskeletal:  grossly normal tone BUE/BLE, good ROM, no bony abnormality Lower extremity:  No LE edema.  Limited foot exam with no ulcerations.  2+ distal pulses. Psychiatric:  follows some directions and will answer a few questions, otherwise doesn't interact.  Neurologic:  limited exam. Rightward gaze deviation. Moves LE and UE spontaneously. Right leg some increased rigidity from polio. Sensation intact.   Radiological Exams on Admission: Independently reviewed - see discussion in A/P where applicable  MR BRAIN WO CONTRAST  Result Date: 04/01/2022 CLINICAL DATA:  Neuro deficit, acute, stroke suspected EXAM: MRI HEAD WITHOUT CONTRAST MRA HEAD WITHOUT CONTRAST TECHNIQUE: Multiplanar, multi-echo pulse sequences of the brain and surrounding structures were acquired without intravenous contrast. Angiographic images of the Circle of Willis were acquired using MRA technique without intravenous contrast. COMPARISON:  MRI head November 25, 2020.  MRA head April 10, 2012. FINDINGS: MR HEAD FINDINGS Brain: Punctate focus of DWI hyperintensity in the right posterior frontal white matter (series 2, image  48) without definite ADC correlate. Similar ventriculomegaly. No evidence of mass lesion, midline shift or acute hemorrhage. Remote infarcts in the cerebellum bilaterally. Scattered T2/FLAIR hyperintensities in the white matter, compatible with chronic microvascular ischemic disease. Vascular: See below. Skull and upper cervical spine: Normal marrow signal. Sinuses/Orbits: No acute findings. Other: Bilateral mastoid effusions. MRA HEAD FINDINGS Significantly motion limited study.  Within this limitation: Anterior circulation: Bilateral intracranial ICAs, proximal MCAs and proximal ACAs are patent. Posterior circulation: Bilateral intradural  vertebral arteries, basilar artery and proximal posterior cerebral arteries are patent. IMPRESSION: MRI head: 1. Punctate focus of DWI hyperintensity in the right posterior frontal white matter without definite ADC correlate. This could represent a tiny subacute infarct versus artifact. 2. Similar ventriculomegaly, mildly disc portion it to sulcal enlargement. This could represent central predominant atrophy or normal pressure hydrocephalus. 3. Chronic microvascular ischemic disease and bilateral remote cerebellar infarcts. 4. Bilateral mastoid effusions. MRA: Significantly motion limited study without evidence of proximal large vessel occlusion. Electronically Signed   By: Margaretha Sheffield M.D.   On: 04/01/2022 14:59   MR ANGIO HEAD WO CONTRAST  Result Date: 04/01/2022 CLINICAL DATA:  Neuro deficit, acute, stroke suspected EXAM: MRI HEAD WITHOUT CONTRAST MRA HEAD WITHOUT CONTRAST TECHNIQUE: Multiplanar, multi-echo pulse sequences of the brain and surrounding structures were acquired without intravenous contrast. Angiographic images of the Circle of Willis were acquired using MRA technique without intravenous contrast. COMPARISON:  MRI head November 25, 2020.  MRA head April 10, 2012. FINDINGS: MR HEAD FINDINGS Brain: Punctate focus of DWI hyperintensity in the right posterior  frontal white matter (series 2, image 48) without definite ADC correlate. Similar ventriculomegaly. No evidence of mass lesion, midline shift or acute hemorrhage. Remote infarcts in the cerebellum bilaterally. Scattered T2/FLAIR hyperintensities in the white matter, compatible with chronic microvascular ischemic disease. Vascular: See below. Skull and upper cervical spine: Normal marrow signal. Sinuses/Orbits: No acute findings. Other: Bilateral mastoid effusions. MRA HEAD FINDINGS Significantly motion limited study.  Within this limitation: Anterior circulation: Bilateral intracranial ICAs, proximal MCAs and proximal ACAs are patent. Posterior circulation: Bilateral intradural vertebral arteries, basilar artery and proximal posterior cerebral arteries are patent. IMPRESSION: MRI head: 1. Punctate focus of DWI hyperintensity in the right posterior frontal white matter without definite ADC correlate. This could represent a tiny subacute infarct versus artifact. 2. Similar ventriculomegaly, mildly disc portion it to sulcal enlargement. This could represent central predominant atrophy or normal pressure hydrocephalus. 3. Chronic microvascular ischemic disease and bilateral remote cerebellar infarcts. 4. Bilateral mastoid effusions. MRA: Significantly motion limited study without evidence of proximal large vessel occlusion. Electronically Signed   By: Margaretha Sheffield M.D.   On: 04/01/2022 14:59   DG Chest 2 View  Result Date: 04/01/2022 CLINICAL DATA:  Altered mental status EXAM: CHEST - 2 VIEW COMPARISON:  02/28/2022 FINDINGS: Previously seen central line has been removed. Stable heart size. Low lung volumes. Trace bilateral pleural effusions, more evident on the left. No airspace consolidation. No pneumothorax. IMPRESSION: Trace bilateral pleural effusions, more evident on the left. Electronically Signed   By: Davina Poke D.O.   On: 04/01/2022 14:03   CT HEAD CODE STROKE WO CONTRAST  Result Date:  04/01/2022 CLINICAL DATA:  Code stroke. EXAM: CT HEAD WITHOUT CONTRAST TECHNIQUE: Contiguous axial images were obtained from the base of the skull through the vertex without intravenous contrast. RADIATION DOSE REDUCTION: This exam was performed according to the departmental dose-optimization program which includes automated exposure control, adjustment of the mA and/or kV according to patient size and/or use of iterative reconstruction technique. COMPARISON:  03/08/2022 FINDINGS: Brain: No evidence of acute infarction, hemorrhage, cerebral edema, mass, mass effect, or midline shift. No hydrocephalus or extra-axial collection. Diffuse ventricular enlargement, unchanged. Periventricular white matter changes, likely the sequela of chronic small vessel ischemic disease. Vascular: No hyperdense vessel. Skull: Negative for fracture or focal lesion. Sinuses/Orbits: No acute finding. Status post bilateral lens replacements. Other: Fluid in the right-greater-than-left mastoid air cells. ASPECTS [facility] Stroke Program Early  CT Score) - Ganglionic level infarction (caudate, lentiform nuclei, internal capsule, insula, M1-M3 cortex): 7 - Supraganglionic infarction (M4-M6 cortex): 3 Total score (0-10 with 10 being normal): 10 IMPRESSION: 1. No acute intracranial process. 2. ASPECTS is 10. Code stroke imaging results were communicated on 04/01/2022 at 1:12 pm to provider BHAGAT via secure text paging. Electronically Signed   By: Merilyn Baba M.D.   On: 04/01/2022 13:12    EKG: Independently reviewed.  NSR with rate 63; nonspecific ST changes with no evidence of acute ischemia   Labs on Admission: I have personally reviewed the available labs and imaging studies at the time of the admission.  Pertinent labs:   wbc: 10.9,  hgb: 10.6,  BUN: 30,  creatinine: 1.99,  Assessment and Plan: Principal Problem:   Altered mental status Active Problems:   acute CVA vs. artifact    Hypothermia   recent GI bleed in  02/2022   Right tibial fracture   CKD (chronic kidney disease) stage 3, GFR 30-59 ml/min (HCC)   Hypertension   BPH (benign prostatic hyperplasia)   Type 2 diabetes mellitus (HCC)   Dementia (HCC)    Assessment and Plan: * Altered mental status 86 year old male presenting with 1 day history of acute encephalopathy, becoming unresponsive and slumped over in his WC mid-morning -admit to progressive -neuro vs. Infectious -MRI shows possible tiny subacute infarct in posterior frontal white matter vs. Artifact and he has gaze preference on exam  -neurology consulted, eeg ordered  -neurochecks per protocol -swallow screen -? If ASA ever started back at hospital d/c as note stated f/u with neurologist  -hypothermic, but no other criteria for sepsis. Urine does look infected and has hx of frequent UTIs this will be his 3rd since September; however urine culture form 9/12 did not grow any bacteria  -pan cultured, continue rocephin/IVF -check metabolic labs as he is also FTT -protecting airway   acute CVA vs. artifact  -Neurochecks per protocol -Neurology consulted -echo -lipid panel and A1C -hold ASA with recent GI bleed per neurology  -Permissive hypertension first 24 hours <220/110 -N.p.o. until bedside swallow screen (was on dysphagia 3 diet at d/c) -PT/ OT/ SLP consult   Hypothermia Infectious vs. Age/inactivity and low body weight Cultures pending, continue rocephin Room warm with blankets and temp improving.  Check TSH   recent GI bleed in 02/2022 S/p EGD which showed gastric and duodenal ulcers and erosion without active bleeding. S/p 1 unit RBC ASA was held at this time  Has not been taking his PPI '40mg'$  BID x 6 weeks Starting IV while NPO   Right tibial fracture [place] in August and broke leg Non weight bearing Has not followed up with ortho as he keeps ending up back in hospital  He was bedbound before he fell   CKD (chronic kidney disease) stage 3, GFR 30-59 ml/min  (HCC) Baseline creatinine around 1.8, stable Continue to monitor   Dementia (HCC) delirium precautions Continue namenda and remeron   Type 2 diabetes mellitus (HCC) Recent A1C of 7.5 SSI and accuchecks qac/hs   BPH (benign prostatic hyperplasia) Continue flomax   Hypertension Soft readings, hold norvasc and lopressor and benicar  Allow for permissive Htn in setting of acute CVA  Continue to monitor     Advance Care Planning:   Code Status: DNR   Consults: neurology/palliative care   DVT Prophylaxis: SCDs  Family Communication: wife at bedside   Severity of Illness: The appropriate patient status for this  patient is INPATIENT. Inpatient status is judged to be reasonable and necessary in order to provide the required intensity of service to ensure the patient's safety. The patient's presenting symptoms, physical exam findings, and initial radiographic and laboratory data in the context of their chronic comorbidities is felt to place them at high risk for further clinical deterioration. Furthermore, it is not anticipated that the patient will be medically stable for discharge from the hospital within 2 midnights of admission.   * I certify that at the point of admission it is my clinical judgment that the patient will require inpatient hospital care spanning beyond 2 midnights from the point of admission due to high intensity of service, high risk for further deterioration and high frequency of surveillance required.*  Author: Orma Flaming, MD 04/01/2022 7:08 PM  For on call review [email or web address].

## 2022-04-01 NOTE — Assessment & Plan Note (Signed)
Infectious vs. Age/inactivity and low body weight Cultures pending, continue rocephin Room warm with blankets and temp improving.  Check TSH

## 2022-04-01 NOTE — ED Provider Notes (Addendum)
[facility] EMERGENCY DEPARTMENT Provider Note   CSN: [id number] Arrival date & time: 04/01/22  1231  An emergency department physician performed an initial assessment on this suspected stroke patient at 1252.  History  Chief Complaint  Patient presents with   Altered Mental Status   Level 5 caveat: AMS  Mike Mack is a 86 y.o. male with a PMHx of hypertension, diabetes, CVA, who presents to the ED brought in by EMS with concerns for AMS onset 1-2 hours ago.  Family is not at bedside upon my initial evaluation.  Per EMS patient had a CBG of 227.  Patient does have a history of diabetes.  EMS noted that per family patient was at his baseline which is ANO x4 prior to 1-2 hours ago when he noted increased confusion.  With EMS pressures were in the 38L systolically.  The history is provided by the EMS personnel and the spouse. No language interpreter was used.       Home Medications Prior to Admission medications   Medication Sig Start Date End Date Taking? Authorizing Provider  acetaminophen (TYLENOL) 325 MG tablet Take 2 tablets (650 mg total) by mouth every 6 (six) hours as needed for mild pain (or Fever >/= 101). 11/27/20   Debbe Odea, MD  atorvastatin (LIPITOR) 20 MG tablet Take 20 mg by mouth every evening.  11/03/13   [provider]  B Complex Vitamins (B COMPLEX PO) Take 1 tablet by mouth at bedtime.    [provider]  brimonidine (ALPHAGAN) 0.2 % ophthalmic solution Place 1 drop into both eyes 3 (three) times daily.    [provider]  cholecalciferol (VITAMIN D3) 10 MCG (400 UNIT) TABS tablet Take 400 Units by mouth daily.    [provider]  docusate sodium (COLACE) 100 MG capsule Take 1 capsule (100 mg total) by mouth 2 (two) times daily as needed for mild constipation. 03/09/22   Geradine Girt, DO  dorzolamide-timolol (COSOPT) 22.3-6.8 MG/ML ophthalmic solution Place 1 drop into both eyes 2 (two) times daily.  03/04/20   [provider]  feeding supplement, GLUCERNA SHAKE, (GLUCERNA SHAKE) LIQD Take 237 mLs by mouth 3 (three) times daily between meals. Patient taking differently: Take 237 mLs by mouth 2 (two) times daily between meals. 03/25/20   Antonieta Pert, MD  HYDROcodone-acetaminophen (NORCO/VICODIN) 5-325 MG tablet Take 1 tablet by mouth every 4 (four) hours as needed for moderate pain. 03/09/22   Geradine Girt, DO  insulin aspart (NOVOLOG) 100 UNIT/ML injection Inject 0-9 Units into the skin 3 (three) times daily with meals. 03/09/22   Geradine Girt, DO  latanoprost (XALATAN) 0.005 % ophthalmic solution Place 1 drop into both eyes at bedtime.  11/17/14   [provider]  levothyroxine (SYNTHROID) 88 MCG tablet Take 88 mcg by mouth at bedtime.  04/21/11   [provider]  memantine (NAMENDA) 10 MG tablet Take 10 mg by mouth in the morning and at bedtime. 11/04/20   [provider]  mirtazapine (REMERON SOL-TAB) 15 MG disintegrating tablet Take 1 tablet (15 mg total) by mouth at bedtime. 03/09/22   Geradine Girt, DO  Multiple Vitamin (MULTIVITAMIN WITH MINERALS) TABS tablet Take 1 tablet by mouth daily. 09/06/13   [name], MD  ondansetron (ZOFRAN-ODT) 8 MG disintegrating tablet Take 1 tablet (8 mg total) by mouth every 8 (eight) hours as needed for nausea. Patient not taking: Reported on 02/28/2022 08/28/21   Varney Biles, MD  pantoprazole (PROTONIX) 40 MG tablet Take 1 tablet (40 mg total) by mouth 2 (two) times daily before a meal. Patient taking differently: Take 40 mg by mouth in the morning and at bedtime. 12/09/20   Lavina Hamman, MD  polyethylene glycol [facility] Floria Raveling) packet Take 17 g by mouth daily. Patient taking differently: Take 17 g by mouth daily as needed for mild constipation. 09/06/13   [name], MD  RHOPRESSA 0.02 % SOLN Place 1 drop into both eyes at bedtime. 03/05/20   [provider]  Tamsulosin HCl (FLOMAX) 0.4  MG CAPS Take 0.4 mg by mouth in the morning and at bedtime.    [provider]      Allergies    Metformin and related and Soliqua [insulin glargine-lixisenatide]    Review of Systems   Review of Systems  Unable to perform ROS: Mental status change  All other systems reviewed and are negative.   Physical Exam Updated Vital Signs BP 113/87 (BP Location: Right Arm)   Pulse 66   Temp (!) 96.2 F (35.7 C) (Rectal)   Resp 12   SpO2 100%  Physical Exam Vitals and nursing note reviewed.  Constitutional:      General: He is not in acute distress.    Appearance: He is not diaphoretic.  HENT:     Head: Normocephalic and atraumatic.     Mouth/Throat:     Pharynx: No oropharyngeal exudate.  Eyes:     General: No scleral icterus.    Conjunctiva/sclera: Conjunctivae normal.  Cardiovascular:     Rate and Rhythm: Normal rate and regular rhythm.     Pulses: Normal pulses.     Heart sounds: Normal heart sounds.  Pulmonary:     Effort: Pulmonary effort is normal. No respiratory distress.     Breath sounds: Normal breath sounds. No wheezing.  Abdominal:     General: Bowel sounds are normal.     Palpations: Abdomen is soft. There is no mass.     Tenderness: There is no abdominal tenderness. There is no guarding or rebound.  Musculoskeletal:        General: Normal range of motion.     Cervical back: Normal range of motion and neck supple.     Comments: No obvious skin changes noted to the patient's bilateral upper extremities, bilateral lower extremities, back.  Patient with a brace in place to the right lower leg with obvious atrophy of the right lower extremity.   Skin:    General: Skin is warm and dry.  Neurological:     Mental Status: He is alert.     GCS: GCS eye subscore is 4. GCS verbal subscore is 2. GCS motor subscore is 4.     Comments: Patient able to open up his eyes spontaneously.  Patient unable to obey commands however is able to withdraw from pain.   Psychiatric:        Behavior: Behavior normal.     ED Results / Procedures / Treatments   Labs (all labs ordered are listed, but only abnormal results are displayed) Labs Reviewed  PROTIME-INR - Abnormal; Notable for the following components:      Result Value   Prothrombin Time 15.6 (*)    INR 1.3 (*)    All other components within normal limits  CBC - Abnormal; Notable for the following components:   WBC 10.9 (*)    RBC 3.39 (*)    Hemoglobin 10.6 (*)  HCT 34.0 (*)    MCV 100.3 (*)    RDW 17.0 (*)    All other components within normal limits  DIFFERENTIAL - Abnormal; Notable for the following components:   Neutro Abs 9.1 (*)    Abs Immature Granulocytes 0.10 (*)    All other components within normal limits  COMPREHENSIVE METABOLIC PANEL - Abnormal; Notable for the following components:   Glucose, Bld 189 (*)    BUN 30 (*)    Creatinine, Ser 1.99 (*)    Calcium 8.7 (*)    Albumin 2.7 (*)    GFR, Estimated 33 (*)    All other components within normal limits  I-STAT CHEM 8, ED - Abnormal; Notable for the following components:   BUN 35 (*)    Creatinine, Ser 1.90 (*)    Glucose, Bld 188 (*)    Calcium, Ion 1.12 (*)    Hemoglobin 12.2 (*)    HCT 36.0 (*)    All other components within normal limits  CBG MONITORING, ED - Abnormal; Notable for the following components:   Glucose-Capillary 150 (*)    All other components within normal limits  CULTURE, BLOOD (ROUTINE X 2)  CULTURE, BLOOD (ROUTINE X 2)  RESP PANEL BY RT-PCR (FLU A&B, COVID) ARPGX2  ETHANOL  APTT  AMMONIA  LACTIC ACID, PLASMA  RAPID URINE DRUG SCREEN, HOSP PERFORMED  URINALYSIS, ROUTINE W REFLEX MICROSCOPIC  LACTIC ACID, PLASMA    EKG EKG Interpretation  Date/Time:  Saturday April 01 2022 12:46:03 EDT Ventricular Rate:  63 PR Interval:  192 QRS Duration: 125 QT Interval:  480 QTC Calculation: 492 R Axis:   -2 Text Interpretation: Sinus rhythm Nonspecific intraventricular conduction  delay Minimal ST elevation, anterior leads similar to prior EKGs Confirmed by Regan Lemming ([id number]) on 04/01/2022 2:56:09 PM  Radiology MR BRAIN WO CONTRAST  Result Date: 04/01/2022 CLINICAL DATA:  Neuro deficit, acute, stroke suspected EXAM: MRI HEAD WITHOUT CONTRAST MRA HEAD WITHOUT CONTRAST TECHNIQUE: Multiplanar, multi-echo pulse sequences of the brain and surrounding structures were acquired without intravenous contrast. Angiographic images of the Circle of Willis were acquired using MRA technique without intravenous contrast. COMPARISON:  MRI head November 25, 2020.  MRA head April 10, 2012. FINDINGS: MR HEAD FINDINGS Brain: Punctate focus of DWI hyperintensity in the right posterior frontal white matter (series 2, image 48) without definite ADC correlate. Similar ventriculomegaly. No evidence of mass lesion, midline shift or acute hemorrhage. Remote infarcts in the cerebellum bilaterally. Scattered T2/FLAIR hyperintensities in the white matter, compatible with chronic microvascular ischemic disease. Vascular: See below. Skull and upper cervical spine: Normal marrow signal. Sinuses/Orbits: No acute findings. Other: Bilateral mastoid effusions. MRA HEAD FINDINGS Significantly motion limited study.  Within this limitation: Anterior circulation: Bilateral intracranial ICAs, proximal MCAs and proximal ACAs are patent. Posterior circulation: Bilateral intradural vertebral arteries, basilar artery and proximal posterior cerebral arteries are patent. IMPRESSION: MRI head: 1. Punctate focus of DWI hyperintensity in the right posterior frontal white matter without definite ADC correlate. This could represent a tiny subacute infarct versus artifact. 2. Similar ventriculomegaly, mildly disc portion it to sulcal enlargement. This could represent central predominant atrophy or normal pressure hydrocephalus. 3. Chronic microvascular ischemic disease and bilateral remote cerebellar infarcts. 4. Bilateral mastoid effusions.  MRA: Significantly motion limited study without evidence of proximal large vessel occlusion. Electronically Signed   By: Margaretha Sheffield M.D.   On: 04/01/2022 14:59   MR ANGIO HEAD WO CONTRAST  Result Date: 04/01/2022 CLINICAL DATA:  Neuro  deficit, acute, stroke suspected EXAM: MRI HEAD WITHOUT CONTRAST MRA HEAD WITHOUT CONTRAST TECHNIQUE: Multiplanar, multi-echo pulse sequences of the brain and surrounding structures were acquired without intravenous contrast. Angiographic images of the Circle of Willis were acquired using MRA technique without intravenous contrast. COMPARISON:  MRI head November 25, 2020.  MRA head April 10, 2012. FINDINGS: MR HEAD FINDINGS Brain: Punctate focus of DWI hyperintensity in the right posterior frontal white matter (series 2, image 48) without definite ADC correlate. Similar ventriculomegaly. No evidence of mass lesion, midline shift or acute hemorrhage. Remote infarcts in the cerebellum bilaterally. Scattered T2/FLAIR hyperintensities in the white matter, compatible with chronic microvascular ischemic disease. Vascular: See below. Skull and upper cervical spine: Normal marrow signal. Sinuses/Orbits: No acute findings. Other: Bilateral mastoid effusions. MRA HEAD FINDINGS Significantly motion limited study.  Within this limitation: Anterior circulation: Bilateral intracranial ICAs, proximal MCAs and proximal ACAs are patent. Posterior circulation: Bilateral intradural vertebral arteries, basilar artery and proximal posterior cerebral arteries are patent. IMPRESSION: MRI head: 1. Punctate focus of DWI hyperintensity in the right posterior frontal white matter without definite ADC correlate. This could represent a tiny subacute infarct versus artifact. 2. Similar ventriculomegaly, mildly disc portion it to sulcal enlargement. This could represent central predominant atrophy or normal pressure hydrocephalus. 3. Chronic microvascular ischemic disease and bilateral remote cerebellar  infarcts. 4. Bilateral mastoid effusions. MRA: Significantly motion limited study without evidence of proximal large vessel occlusion. Electronically Signed   By: Margaretha Sheffield M.D.   On: 04/01/2022 14:59   DG Chest 2 View  Result Date: 04/01/2022 CLINICAL DATA:  Altered mental status EXAM: CHEST - 2 VIEW COMPARISON:  02/28/2022 FINDINGS: Previously seen central line has been removed. Stable heart size. Low lung volumes. Trace bilateral pleural effusions, more evident on the left. No airspace consolidation. No pneumothorax. IMPRESSION: Trace bilateral pleural effusions, more evident on the left. Electronically Signed   By: Davina Poke D.O.   On: 04/01/2022 14:03   CT HEAD CODE STROKE WO CONTRAST  Result Date: 04/01/2022 CLINICAL DATA:  Code stroke. EXAM: CT HEAD WITHOUT CONTRAST TECHNIQUE: Contiguous axial images were obtained from the base of the skull through the vertex without intravenous contrast. RADIATION DOSE REDUCTION: This exam was performed according to the departmental dose-optimization program which includes automated exposure control, adjustment of the mA and/or kV according to patient size and/or use of iterative reconstruction technique. COMPARISON:  03/08/2022 FINDINGS: Brain: No evidence of acute infarction, hemorrhage, cerebral edema, mass, mass effect, or midline shift. No hydrocephalus or extra-axial collection. Diffuse ventricular enlargement, unchanged. Periventricular white matter changes, likely the sequela of chronic small vessel ischemic disease. Vascular: No hyperdense vessel. Skull: Negative for fracture or focal lesion. Sinuses/Orbits: No acute finding. Status post bilateral lens replacements. Other: Fluid in the right-greater-than-left mastoid air cells. ASPECTS [facility] Stroke Program Early CT Score) - Ganglionic level infarction (caudate, lentiform nuclei, internal capsule, insula, M1-M3 cortex): 7 - Supraganglionic infarction (M4-M6 cortex): 3 Total score (0-10  with 10 being normal): 10 IMPRESSION: 1. No acute intracranial process. 2. ASPECTS is 10. Code stroke imaging results were communicated on 04/01/2022 at 1:12 pm to provider BHAGAT via secure text paging. Electronically Signed   By: Merilyn Baba M.D.   On: 04/01/2022 13:12    Procedures .Critical Care E&M  Performed by: Nehemiah Settle, PA-C Critical care provider statement:    Critical care time (minutes):  60   Critical care time was exclusive of:  Separately billable procedures and treating other patients  Critical care was necessary to treat or prevent imminent or life-threatening deterioration of the following conditions:  Cardiac failure, circulatory failure, renal failure, respiratory failure, dehydration and CNS failure or compromise   Critical care was time spent personally by me on the following activities:  Ordering and performing treatments and interventions, discussions with consultants, ordering and review of laboratory studies, ordering and review of radiographic studies, evaluation of patient's response to treatment, examination of patient, re-evaluation of patient's condition and review of old charts   I assumed direction of critical care for this patient from another provider in my specialty: no   After initial E/M assessment, critical care services were subsequently performed that were exclusive of separately billable procedures or treatment.       Medications Ordered in ED Medications - No data to display  ED Course/ Medical Decision Making/ A&P Clinical Course as of 04/01/22 1502  Sat Apr 01, 2022  1300 Neurologist at bedside [SB]  1300 Wife arrived to the emergency department and noted that at 6:30 AM patient ate his breakfast and was at his baseline and was speaking with her without difficulty.  Wife notes that around 11:30 AM she went to check on the patient who was sitting in his wheelchair slumped over when he was unresponsive and not at his baseline. [SB]     Clinical Course User Index [SB] Donnovan Stamour A, PA-C                           Medical Decision Making Amount and/or Complexity of Data Reviewed Labs: ordered. Radiology: ordered.  Risk Prescription drug management.   Pt presents with concerns for altered mental status onset today prior to arrival.  Brought in by EMS.  Patient nonverbal on initial exam.  Per patient wife, patient was normal at 6:30 AM upon eating his breakfast.  Wife checked on patient again at 11:30 AM after seeing him slumped in his wheelchair and noted that the patient was nonverbal and not responding.  Patient hypothermic here in the emergency department with a rectal temp of 96.2, not tachycardic or hypoxic.  On exam patient with no acute cardiovascular, respiratory, downtime findings.  Unable to follow commands.  No obvious skin changes noted to the patient's bilateral upper extremities, bilateral lower extremities, back.  Patient with a brace in place to the right lower leg with obvious atrophy of the right lower extremity. Patient able to open up his eyes spontaneously.  Patient unable to obey commands however is able to withdraw from pain.  Differential diagnosis includes anemia, hypoglycemia, intracranial abnormality, CVA, TIA, electrolyte abnormality, pneumonia, acute cystitis, arrhythmia, COVID, flu.  Co morbidities that complicate the patient evaluation: CVA, diabetes  Additional history obtained:  Additional history obtained from Spouse/Significant Other  Labs:  I ordered, and personally interpreted labs.  The pertinent results include:  CBG at 150. I-STAT Chem-8 notable for AKI, creatinine elevated at 1.9, BUN elevated at 35 otherwise unremarkable. COVID and flu swab ordered with results pending at time of signout Blood cultures ordered with results pending at time of admission. Urinalysis and UDS ordered with results pending at time of signout. CBC with slight leukocytosis at 10.9, hemoglobin improved  to 10.6 from previous value 3 weeks ago. Lactic acid at 1.8 unremarkable Ethanol negative. Ammonia negative  Imaging: I ordered imaging studies including CT head, CXR I independently visualized and interpreted imaging which showed: CT head notable for no acute intracranial process.  Chest x-ray  with trace bilateral pleural effusions, more so on the left. MR brain and MRA head ordered with results pending at time of signout I agree with the radiologist interpretation  Patient case discussed with Suella Broad, PA-C at sign-out. Plan at sign-out is pending labs, likely Admission to the hospital, however, plans may change as per oncoming team. Patient care transferred at sign out.    This chart was dictated using voice recognition software, Dragon. Despite the best efforts of this provider to proofread and correct errors, errors may still occur which can change documentation meaning.   Final Clinical Impression(s) / ED Diagnoses Final diagnoses:  Altered mental status, unspecified altered mental status type    Rx / DC Orders ED Discharge Orders     None         Janiyla Long A, PA-C 04/01/22 1523    Larsen Zettel A, PA-C 04/01/22 1529    Regan Lemming, MD 04/01/22 [id number]

## 2022-04-01 NOTE — ED Triage Notes (Signed)
Pt BIB [facility] from home d/t AMS. Pt's baseline is A&OX4, however, 2 hrs ago prior EMS arrival, Pt started having confused episodes. Initial pressure w EMS was in the 70s palpated.

## 2022-04-01 NOTE — Assessment & Plan Note (Signed)
Recent A1C of 7.5 SSI and accuchecks qac/hs

## 2022-04-01 NOTE — ED Notes (Signed)
The swallow screen was attempted but the pt refused to co-operate

## 2022-04-01 NOTE — Progress Notes (Signed)
EEG complete - results pending 

## 2022-04-01 NOTE — ED Notes (Signed)
Was only able to obtain 1 set of blood culture as the pt is a hard stick. PA made aware.

## 2022-04-01 NOTE — Assessment & Plan Note (Signed)
-  Neurochecks per protocol -Neurology consulted -echo -lipid panel and A1C -hold ASA with recent GI bleed per neurology  -Permissive hypertension first 24 hours <220/110 -N.p.o. until bedside swallow screen (was on dysphagia 3 diet at d/c) -PT/ OT/ SLP consult

## 2022-04-01 NOTE — Code Documentation (Signed)
Stroke Response Nurse Documentation Code Documentation  Mike Mack is a 86 y.o. male arriving to [facility]  via [facility] on 04-01-2022 with past medical hx of CVA, HTN, DM, Dementia, CKD. On No antithrombotic. Code stroke was activated by ED.   Patient from home where he was LKW at [place] this morning and now complaining of drowsiness and not speaking.  Per wife he was at his usual baseline this morning at breakfast.  When she checked on him after his morning nap he was difficult to arouse and was weaker than usual and not speaking.    Stroke team at the bedside on patient arrival. Labs drawn and patient cleared for CT by Dr. [name]. Patient to CT with team. NIHSS 23, see documentation for details and code stroke times. Patient with decreased LOC, disoriented, not following commands, right gaze preference , right facial droop, bilateral arm weakness, bilateral leg weakness, Global aphasia , dysarthria , and right neglect on exam. The following imaging was completed:  CT Head. Patient is not a candidate for IV Thrombolytic due to outside TNK window. Patient is not a candidate for IR due to baseline functional status.   Care Plan: mNIHSS and VS q 2 hours.   Bedside handoff with ED RN Mike Mack.    Mike Mack  Stroke Response RN

## 2022-04-01 NOTE — Assessment & Plan Note (Signed)
Fell in August and broke leg Non weight bearing Has not followed up with ortho as he keeps ending up back in hospital  He was bedbound before he fell

## 2022-04-02 ENCOUNTER — Inpatient Hospital Stay (HOSPITAL_COMMUNITY): Payer: Medicare HMO

## 2022-04-02 DIAGNOSIS — Z8673 Personal history of transient ischemic attack (TIA), and cerebral infarction without residual deficits: Secondary | ICD-10-CM

## 2022-04-02 DIAGNOSIS — R410 Disorientation, unspecified: Secondary | ICD-10-CM | POA: Diagnosis not present

## 2022-04-02 DIAGNOSIS — G9341 Metabolic encephalopathy: Secondary | ICD-10-CM | POA: Diagnosis not present

## 2022-04-02 DIAGNOSIS — G9340 Encephalopathy, unspecified: Secondary | ICD-10-CM

## 2022-04-02 DIAGNOSIS — Z7189 Other specified counseling: Secondary | ICD-10-CM | POA: Diagnosis not present

## 2022-04-02 DIAGNOSIS — N390 Urinary tract infection, site not specified: Secondary | ICD-10-CM

## 2022-04-02 DIAGNOSIS — N179 Acute kidney failure, unspecified: Secondary | ICD-10-CM

## 2022-04-02 DIAGNOSIS — F03C11 Unspecified dementia, severe, with agitation: Secondary | ICD-10-CM | POA: Diagnosis not present

## 2022-04-02 DIAGNOSIS — I959 Hypotension, unspecified: Secondary | ICD-10-CM

## 2022-04-02 DIAGNOSIS — E785 Hyperlipidemia, unspecified: Secondary | ICD-10-CM

## 2022-04-02 DIAGNOSIS — I639 Cerebral infarction, unspecified: Secondary | ICD-10-CM | POA: Diagnosis not present

## 2022-04-02 DIAGNOSIS — E119 Type 2 diabetes mellitus without complications: Secondary | ICD-10-CM

## 2022-04-02 DIAGNOSIS — R4182 Altered mental status, unspecified: Secondary | ICD-10-CM | POA: Diagnosis not present

## 2022-04-02 DIAGNOSIS — Z515 Encounter for palliative care: Secondary | ICD-10-CM | POA: Diagnosis not present

## 2022-04-02 LAB — HEMOGLOBIN A1C
Hgb A1c MFr Bld: 5.5 % (ref 4.8–5.6)
Mean Plasma Glucose: 111.15 mg/dL

## 2022-04-02 LAB — LIPID PANEL
Cholesterol: 94 mg/dL (ref 0–200)
HDL: 44 mg/dL (ref >40)
LDL Cholesterol: 38 mg/dL (ref 0–99)
Total CHOL/HDL Ratio: 2.1 RATIO
Triglycerides: 59 mg/dL (ref <150)
VLDL: 12 mg/dL (ref 0–40)

## 2022-04-02 MED ORDER — ASPIRIN 300 MG RE SUPP
300.0000 mg | Freq: Every day | RECTAL | Status: DC
  Administered 2022-04-02: 300 mg via RECTAL
  Filled 2022-04-02: qty 1

## 2022-04-02 MED ORDER — MIRTAZAPINE 15 MG PO TBDP
15.0000 mg | ORAL_TABLET | Freq: Every day | ORAL | Status: DC
  Administered 2022-04-02 – 2022-04-04 (×3): 15 mg via ORAL
  Filled 2022-04-02 (×4): qty 1

## 2022-04-02 MED ORDER — ASPIRIN 81 MG PO TBEC
81.0000 mg | DELAYED_RELEASE_TABLET | Freq: Every day | ORAL | Status: DC
  Administered 2022-04-03 – 2022-04-05 (×3): 81 mg via ORAL
  Filled 2022-04-02 (×3): qty 1

## 2022-04-02 MED ORDER — ATORVASTATIN CALCIUM 10 MG PO TABS
20.0000 mg | ORAL_TABLET | Freq: Every evening | ORAL | Status: DC
  Administered 2022-04-02 – 2022-04-04 (×3): 20 mg via ORAL
  Filled 2022-04-02 (×3): qty 2

## 2022-04-02 MED ORDER — TAMSULOSIN HCL 0.4 MG PO CAPS
0.4000 mg | ORAL_CAPSULE | Freq: Every day | ORAL | Status: DC
  Administered 2022-04-02 – 2022-04-04 (×3): 0.4 mg via ORAL
  Filled 2022-04-02 (×3): qty 1

## 2022-04-02 MED ORDER — TIMOLOL MALEATE 0.5 % OP SOLN
1.0000 [drp] | Freq: Two times a day (BID) | OPHTHALMIC | Status: DC
  Administered 2022-04-02 – 2022-04-05 (×6): 1 [drp] via OPHTHALMIC
  Filled 2022-04-02: qty 5

## 2022-04-02 MED ORDER — ENOXAPARIN SODIUM 40 MG/0.4ML IJ SOSY
40.0000 mg | PREFILLED_SYRINGE | INTRAMUSCULAR | Status: DC
  Administered 2022-04-02 – 2022-04-04 (×3): 40 mg via SUBCUTANEOUS
  Filled 2022-04-02 (×3): qty 0.4

## 2022-04-02 MED ORDER — DORZOLAMIDE HCL 2 % OP SOLN
1.0000 [drp] | Freq: Two times a day (BID) | OPHTHALMIC | Status: DC
  Administered 2022-04-02 – 2022-04-05 (×6): 1 [drp] via OPHTHALMIC
  Filled 2022-04-02: qty 10

## 2022-04-02 MED ORDER — MEMANTINE HCL 10 MG PO TABS
10.0000 mg | ORAL_TABLET | Freq: Two times a day (BID) | ORAL | Status: DC
  Administered 2022-04-02 – 2022-04-05 (×6): 10 mg via ORAL
  Filled 2022-04-02 (×7): qty 1

## 2022-04-02 NOTE — ED Notes (Signed)
ED TO INPATIENT HANDOFF REPORT  ED Nurse Name and Phone #: Stefani Dama [phone]  S Name/Age/Gender Mike Mack 86 y.o. male Room/Bed: 007C/007C  Code Status   Code Status: DNR  Home/SNF/Other Home Patient oriented to: self Is this baseline?  Based on wife this is not unusual but it "depends on the day"   Triage Complete: Triage complete  Chief Complaint Acute CVA (cerebrovascular accident)  [facility]) [I63.9]  Triage Note Pt BIB [facility] from home d/t AMS. Pt's baseline is A&OX4, however, 2 hrs ago prior EMS arrival, Pt started having confused episodes. Initial pressure w EMS was in the 70s palpated.    Allergies Allergies  Allergen Reactions   Metformin And Related Other (See Comments)    Gi intolerance   [name] [Insulin Glargine-Lixisenatide] Other (See Comments)    Stomach cramps    Level of Care/Admitting Diagnosis ED Disposition     ED Disposition  Admit   Condition  --   [place]: [place] [[id number]]  Level of Care: Progressive [102]  Admit to Progressive based on following criteria: MULTISYSTEM THREATS such as stable sepsis, metabolic/electrolyte imbalance with or without encephalopathy that is responding to early treatment.  May admit patient to [facility] or [facility] if equivalent level of care is available:: No  Covid Evaluation: Asymptomatic - no recent exposure (last 10 days) testing not required  Diagnosis: Acute CVA (cerebrovascular accident) [facility]) [[id number]]  Admitting Physician: Orma Flaming [[id number]]  Attending Physician: Orma Flaming [[id number]]  Certification:: I certify this patient will need inpatient services for at least 2 midnights  Estimated Length of Stay: 3          B Medical/Surgery History Past Medical History:  Diagnosis Date   Anemia 05/13/2013   Arthritis    "right leg" (07/25/2013)   BPH (benign prostatic hyperplasia)    Constipation 05/13/2013   Exertional shortness of breath    "sometimes"  (07/25/2013)   History of kidney stones    History of stomach ulcers    Hypertension    Neuropathy    Polio    Polio Childhood   Small bowel obstruction ([place])    Stroke ([place]) 2014   residual:  "left hand kind of numb" (07/25/2013)   Type II diabetes mellitus ([place])    Past Surgical History:  Procedure Laterality Date   BIOPSY  11/30/2020   Procedure: BIOPSY;  Surgeon: Ronnette Juniper, MD;  Location: [place];  Service: Gastroenterology;;   BIOPSY  03/03/2022   Procedure: BIOPSY;  Surgeon: Clarene Essex, MD;  Location: [facility];  Service: Gastroenterology;;   BOWEL RESECTION     CATARACT EXTRACTION W/ INTRAOCULAR LENS  IMPLANT, BILATERAL Bilateral 2000's   CHOLECYSTECTOMY N/A 07/25/2013   Procedure: LAPAROSCOPIC CHOLECYSTECTOMY WITH INTRAOPERATIVE CHOLANGIOGRAM;  Surgeon: [name], MD;  Location: [place];  Service: General;  Laterality: N/A;   COLON SURGERY     COLONOSCOPY     Hx: of   CYSTOSCOPY WITH BIOPSY N/A 12/01/2019   Procedure: CYSTOSCOPY WITH BLADDER BIOPSY AND FULGURATION;  Surgeon: Lucas Mallow, MD;  Location: [facility];  Service: Urology;  Laterality: N/A;   ESOPHAGOGASTRODUODENOSCOPY N/A 11/30/2020   Procedure: ESOPHAGOGASTRODUODENOSCOPY (EGD);  Surgeon: Ronnette Juniper, MD;  Location: [place];  Service: Gastroenterology;  Laterality: N/A;   ESOPHAGOGASTRODUODENOSCOPY N/A 03/03/2022   Procedure: ESOPHAGOGASTRODUODENOSCOPY (EGD);  Surgeon: Clarene Essex, MD;  Location: [facility];  Service: Gastroenterology;  Laterality: N/A;   EXCISIONAL HEMORRHOIDECTOMY  2000's   EXTRACORPOREAL SHOCK WAVE  LITHOTRIPSY Right 10/03/2018   Procedure: EXTRACORPOREAL SHOCK WAVE LITHOTRIPSY (ESWL);  Surgeon: Festus Aloe, MD;  Location: [facility];  Service: Urology;  Laterality: Right;   EYE SURGERY     INGUINAL HERNIA REPAIR Right 1970's   IR RADIOLOGIST EVAL & MGMT  04/06/2020   LAPAROSCOPIC CHOLECYSTECTOMY  07/25/2013   w/LOA (07/25/2013)     A IV Location/Drains/Wounds Patient  Lines/Drains/Airways Status     Active Line/Drains/Airways     Name Placement date Placement time Site Days   Peripheral IV 04/01/22 20 G 1.88" Left;Upper Arm 04/01/22  1742  Arm  1   Peripheral IV 04/01/22 22 G 1" Right;Posterior Forearm 04/01/22  2139  Forearm  1   External Urinary Catheter 04/01/22  1339  --  1   Pressure Injury 03/09/22 Buttocks Right Stage 3 -  Full thickness tissue loss. Subcutaneous fat may be visible but bone, tendon or muscle are NOT exposed. 03/09/22  0848  -- 24   Wound / Incision (Open or Dehisced) 03/03/22 Irritant Dermatitis (Moisture Associated Skin Damage) Scrotum Left;Right 03/03/22  1000  Scrotum  30            Intake/Output Last 24 hours No intake or output data in the 24 hours ending 04/02/22 1735  Labs/Imaging Results for orders placed or performed during the hospital encounter of 04/01/22 (from the past 48 hour(s))  Ethanol     Status: None   Collection Time: 04/01/22 12:50 PM  Result Value Ref Range   Alcohol, Ethyl (B) <10 <10 mg/dL    Comment: (NOTE) Lowest detectable limit for serum alcohol is 10 mg/dL.  For medical purposes only. Performed at [facility], [place] [address], [place], [place] [address]   Protime-INR     Status: Abnormal   Collection Time: 04/01/22 12:50 PM  Result Value Ref Range   Prothrombin Time 15.6 (H) 11.4 - 15.2 seconds   INR 1.3 (H) 0.8 - 1.2    Comment: (NOTE) INR goal varies based on device and disease states. Performed at [facility], [place] [address], [place], [place] [address]   APTT     Status: None   Collection Time: 04/01/22 12:50 PM  Result Value Ref Range   aPTT 29 24 - 36 seconds    Comment: Performed at [facility] [address], [place], [place] [address]  CBC     Status: Abnormal   Collection Time: 04/01/22 12:50 PM  Result Value Ref Range   WBC 10.9 (H) 4.0 - 10.5 K/uL   RBC 3.39 (L) 4.22 - 5.81 MIL/uL   Hemoglobin 10.6 (L) 13.0 - 17.0 g/dL   HCT 34.0 (L) 39.0 -  52.0 %   MCV 100.3 (H) 80.0 - 100.0 fL   MCH 31.3 26.0 - 34.0 pg   MCHC 31.2 30.0 - 36.0 g/dL   RDW 17.0 (H) 11.5 - 15.5 %   Platelets 340 150 - 400 K/uL   nRBC 0.0 0.0 - 0.2 %    Comment: Performed at [facility] [address], [place], [place] [address]  Differential     Status: Abnormal   Collection Time: 04/01/22 12:50 PM  Result Value Ref Range   Neutrophils Relative % 83 %   Neutro Abs 9.1 (H) 1.7 - 7.7 K/uL   Lymphocytes Relative 12 %   Lymphs Abs 1.3 0.7 - 4.0 K/uL   Monocytes Relative 4 %   Monocytes Absolute 0.4 0.1 - 1.0 K/uL  Eosinophils Relative 0 %   Eosinophils Absolute 0.0 0.0 - 0.5 K/uL   Basophils Relative 0 %   Basophils Absolute 0.0 0.0 - 0.1 K/uL   Immature Granulocytes 1 %   Abs Immature Granulocytes 0.10 (H) 0.00 - 0.07 K/uL    Comment: Performed at [facility] [address], [place], [place] [address]  Comprehensive metabolic panel     Status: Abnormal   Collection Time: 04/01/22 12:50 PM  Result Value Ref Range   Sodium 137 135 - 145 mmol/L   Potassium 4.3 3.5 - 5.1 mmol/L   Chloride 106 98 - 111 mmol/L   CO2 22 22 - 32 mmol/L   Glucose, Bld 189 (H) 70 - 99 mg/dL    Comment: Glucose reference range applies only to samples taken after fasting for at least 8 hours.   BUN 30 (H) 8 - 23 mg/dL   Creatinine, Ser 1.99 (H) 0.61 - 1.24 mg/dL   Calcium 8.7 (L) 8.9 - 10.3 mg/dL   Total Protein 7.0 6.5 - 8.1 g/dL   Albumin 2.7 (L) 3.5 - 5.0 g/dL   AST 24 15 - 41 U/L   ALT 14 0 - 44 U/L   Alkaline Phosphatase 124 38 - 126 U/L   Total Bilirubin 0.8 0.3 - 1.2 mg/dL   GFR, Estimated 33 (L) >60 mL/min    Comment: (NOTE) Calculated using the CKD-EPI Creatinine Equation (2021)    Anion gap 9 5 - 15    Comment: Performed at [facility], [place] [address], [place], [place] [address]  Ammonia     Status: None   Collection Time: 04/01/22 12:50 PM  Result Value Ref Range   Ammonia 20 9 - 35 umol/L    Comment: HEMOLYSIS AT THIS LEVEL MAY  AFFECT RESULT Performed at [facility], [place] [address], [place], [place] [address]   Lactic acid, plasma     Status: None   Collection Time: 04/01/22 12:50 PM  Result Value Ref Range   Lactic Acid, Venous 1.8 0.5 - 1.9 mmol/L    Comment: Performed at [facility] [address] [place], [place]  [address]  Hemoglobin A1c     Status: None   Collection Time: 04/01/22 12:50 PM  Result Value Ref Range   Hgb A1c MFr Bld 5.5 4.8 - 5.6 %    Comment: (NOTE) Pre diabetes:          5.7%-6.4%  Diabetes:              >6.4%  Glycemic control for   <7.0% adults with diabetes    Mean Plasma Glucose 111.15 mg/dL    Comment: Performed at [facility] [address] [place], [place] [address]  I-stat chem 8, ED     Status: Abnormal   Collection Time: 04/01/22 12:54 PM  Result Value Ref Range   Sodium 140 135 - 145 mmol/L   Potassium 4.6 3.5 - 5.1 mmol/L   Chloride 105 98 - 111 mmol/L   BUN 35 (H) 8 - 23 mg/dL   Creatinine, Ser 1.90 (H) 0.61 - 1.24 mg/dL   Glucose, Bld 188 (H) 70 - 99 mg/dL    Comment: Glucose reference range applies only to samples taken after fasting for at least 8 hours.   Calcium, Ion 1.12 (L) 1.15 - 1.40 mmol/L   TCO2 24 22 - 32 mmol/L   Hemoglobin 12.2 (L) 13.0 - 17.0 g/dL   HCT 36.0 (  L) 39.0 - 52.0 %  CBG monitoring, ED     Status: Abnormal   Collection Time: 04/01/22  1:05 PM  Result Value Ref Range   Glucose-Capillary 150 (H) 70 - 99 mg/dL    Comment: Glucose reference range applies only to samples taken after fasting for at least 8 hours.  Resp Panel by RT-PCR (Flu A&B, Covid) Anterior Nasal Swab     Status: None   Collection Time: 04/01/22  3:40 PM   Specimen: Anterior Nasal Swab  Result Value Ref Range   SARS Coronavirus 2 by RT PCR NEGATIVE NEGATIVE    Comment: (NOTE) SARS-CoV-2 target nucleic acids are NOT DETECTED.  The SARS-CoV-2 RNA is generally detectable in upper respiratory specimens during the acute phase of infection. The  lowest concentration of SARS-CoV-2 viral copies this assay can detect is 138 copies/mL. A negative result does not preclude SARS-Cov-2 infection and should not be used as the sole basis for treatment or other patient management decisions. A negative result may occur with  improper specimen collection/handling, submission of specimen other than nasopharyngeal swab, presence of viral mutation(s) within the areas targeted by this assay, and inadequate number of viral copies(<138 copies/mL). A negative result must be combined with clinical observations, patient history, and epidemiological information. The expected result is Negative.  Fact Sheet for Patients:  EntrepreneurPulse.com.au  Fact Sheet for Healthcare Providers:  IncredibleEmployment.be  This test is no t yet approved or cleared by the [place] FDA and  has been authorized for detection and/or diagnosis of SARS-CoV-2 by FDA under an Emergency Use Authorization (EUA). This EUA will remain  in effect (meaning this test can be used) for the duration of the COVID-19 declaration under Section 564(b)(1) of the Act, 21 U.S.C.section 360bbb-3(b)(1), unless the authorization is terminated  or revoked sooner.       Influenza A by PCR NEGATIVE NEGATIVE   Influenza B by PCR NEGATIVE NEGATIVE    Comment: (NOTE) The Xpert Xpress SARS-CoV-2/FLU/RSV plus assay is intended as an aid in the diagnosis of influenza from Nasopharyngeal swab specimens and should not be used as a sole basis for treatment. Nasal washings and aspirates are unacceptable for Xpert Xpress SARS-CoV-2/FLU/RSV testing.  Fact Sheet for Patients: EntrepreneurPulse.com.au  Fact Sheet for Healthcare Providers: IncredibleEmployment.be  This test is not yet approved or cleared by the [place] FDA and has been authorized for detection and/or diagnosis of SARS-CoV-2 by FDA under an Emergency  Use Authorization (EUA). This EUA will remain in effect (meaning this test can be used) for the duration of the COVID-19 declaration under Section 564(b)(1) of the Act, 21 U.S.C. section 360bbb-3(b)(1), unless the authorization is terminated or revoked.  Performed at [facility], [place] [address] [place], [place] [address]   Urine rapid drug screen (hosp performed)     Status: None   Collection Time: 04/01/22  4:39 PM  Result Value Ref Range   Opiates NONE DETECTED NONE DETECTED   Cocaine NONE DETECTED NONE DETECTED   Benzodiazepines NONE DETECTED NONE DETECTED   Amphetamines NONE DETECTED NONE DETECTED   Tetrahydrocannabinol NONE DETECTED NONE DETECTED   Barbiturates NONE DETECTED NONE DETECTED    Comment: (NOTE) DRUG SCREEN FOR MEDICAL PURPOSES ONLY.  IF CONFIRMATION IS NEEDED FOR ANY PURPOSE, NOTIFY LAB WITHIN 5 DAYS.  LOWEST DETECTABLE LIMITS FOR URINE DRUG SCREEN Drug Class                     Cutoff (ng/mL) Amphetamine  and metabolites    1000 Barbiturate and metabolites    200 Benzodiazepine                 200 Opiates and metabolites        300 Cocaine and metabolites        300 THC                            50 Performed at [facility], [place] [address], [place], [place] [address]   Urinalysis, Routine w reflex microscopic     Status: Abnormal   Collection Time: 04/01/22  4:39 PM  Result Value Ref Range   Color, Urine AMBER (A) YELLOW    Comment: BIOCHEMICALS MAY BE AFFECTED BY COLOR   APPearance TURBID (A) CLEAR   Specific Gravity, Urine 1.023 1.005 - 1.030   pH 5.0 5.0 - 8.0   Glucose, UA NEGATIVE NEGATIVE mg/dL   Hgb urine dipstick LARGE (A) NEGATIVE   Bilirubin Urine NEGATIVE NEGATIVE   Ketones, ur 5 (A) NEGATIVE mg/dL   Protein, ur 100 (A) NEGATIVE mg/dL   Nitrite NEGATIVE NEGATIVE   Leukocytes,Ua MODERATE (A) NEGATIVE   RBC / HPF >50 (H) 0 - 5 RBC/hpf   WBC, UA >50 (H) 0 - 5 WBC/hpf   Bacteria, UA NONE SEEN NONE SEEN   Squamous Epithelial  / LPF 6-10 0 - 5   WBC Clumps PRESENT    Budding Yeast PRESENT     Comment: Performed at [facility], [place] [address] [place], [place] [address]  Vitamin B12     Status: Abnormal   Collection Time: 04/01/22  9:45 PM  Result Value Ref Range   Vitamin B-12 1,169 (H) 180 - 914 pg/mL    Comment: (NOTE) This assay is not validated for testing neonatal or myeloproliferative syndrome specimens for Vitamin B12 levels. Performed at [facility], [place] [address] [place], [place] [address]   Magnesium     Status: None   Collection Time: 04/01/22  9:45 PM  Result Value Ref Range   Magnesium 2.0 1.7 - 2.4 mg/dL    Comment: Performed at [facility] [address] [place],  [address]  Lipid panel     Status: None   Collection Time: 04/01/22  9:45 PM  Result Value Ref Range   Cholesterol 94 0 - 200 mg/dL   Triglycerides 59 <150 mg/dL   HDL 44 >40 mg/dL   Total CHOL/HDL Ratio 2.1 RATIO   VLDL 12 0 - 40 mg/dL   LDL Cholesterol 38 0 - 99 mg/dL    Comment:        Total Cholesterol/HDL:CHD Risk Coronary Heart Disease Risk Table                     Men   Women  1/2 Average Risk   3.4   3.3  Average Risk       5.0   4.4  2 X Average Risk   9.6   7.1  3 X Average Risk  23.4   11.0        Use the calculated Patient Ratio above and the CHD Risk Table to determine the patient's CHD Risk.        ATP III CLASSIFICATION (LDL):  <100     mg/dL   Optimal  100-129  mg/dL   Near or Above  Optimal  130-159  mg/dL   Borderline  160-189  mg/dL   High  >190     mg/dL   Very High Performed at [facility] [address], [place], [place] [address]    EEG adult  Result Date: 04/02/2022 Lora Havens, MD     04/02/2022  8:16 AM Patient Name: Mike Mack MRN: [id number] Epilepsy Attending: Lora Havens Referring Physician/Provider: Lorenza Chick, MD Date: 04/01/2022 Duration: 22.51 mins Patient history: 86yo M with ams. EG to evaluate  for seizure Level of alertness: Awake, drowsy AEDs during EEG study: None Technical aspects: This EEG study was done with scalp electrodes positioned according to the 10-20 International system of electrode placement. Electrical activity was reviewed with band pass filter of 1-'70Hz'$ , sensitivity of 7 uV/mm, display speed of 49m/sec with a '60Hz'$  notched filter applied as appropriate. EEG data were recorded continuously and digitally stored.  Video monitoring was available and reviewed as appropriate. Description: The posterior dominant rhythm consists of  8 Hz activity of moderate voltage (25-35 uV) seen predominantly in posterior head regions, symmetric and reactive to eye opening and eye closing. Drowsiness was characterized by attenuation of the posterior background rhythm. EEG showed intermittent generalized 3 to 6 Hz theta-delta slowing. Hyperventilation and photic stimulation were not performed.   ABNORMALITY - Intermittent slow, generalized IMPRESSION: This study is suggestive of mild to moderate diffuse encephalopathy, nonspecific etiology. No seizures or epileptiform discharges were seen throughout the recording. PLora Havens  MR BRAIN WO CONTRAST  Result Date: 04/01/2022 CLINICAL DATA:  Neuro deficit, acute, stroke suspected EXAM: MRI HEAD WITHOUT CONTRAST MRA HEAD WITHOUT CONTRAST TECHNIQUE: Multiplanar, multi-echo pulse sequences of the brain and surrounding structures were acquired without intravenous contrast. Angiographic images of the Circle of Willis were acquired using MRA technique without intravenous contrast. COMPARISON:  MRI head November 25, 2020.  MRA head April 10, 2012. FINDINGS: MR HEAD FINDINGS Brain: Punctate focus of DWI hyperintensity in the right posterior frontal white matter (series 2, image 48) without definite ADC correlate. Similar ventriculomegaly. No evidence of mass lesion, midline shift or acute hemorrhage. Remote infarcts in the cerebellum bilaterally. Scattered  T2/FLAIR hyperintensities in the white matter, compatible with chronic microvascular ischemic disease. Vascular: See below. Skull and upper cervical spine: Normal marrow signal. Sinuses/Orbits: No acute findings. Other: Bilateral mastoid effusions. MRA HEAD FINDINGS Significantly motion limited study.  Within this limitation: Anterior circulation: Bilateral intracranial ICAs, proximal MCAs and proximal ACAs are patent. Posterior circulation: Bilateral intradural vertebral arteries, basilar artery and proximal posterior cerebral arteries are patent. IMPRESSION: MRI head: 1. Punctate focus of DWI hyperintensity in the right posterior frontal white matter without definite ADC correlate. This could represent a tiny subacute infarct versus artifact. 2. Similar ventriculomegaly, mildly disc portion it to sulcal enlargement. This could represent central predominant atrophy or normal pressure hydrocephalus. 3. Chronic microvascular ischemic disease and bilateral remote cerebellar infarcts. 4. Bilateral mastoid effusions. MRA: Significantly motion limited study without evidence of proximal large vessel occlusion. Electronically Signed   By: FMargaretha SheffieldM.D.   On: 04/01/2022 14:59   MR ANGIO HEAD WO CONTRAST  Result Date: 04/01/2022 CLINICAL DATA:  Neuro deficit, acute, stroke suspected EXAM: MRI HEAD WITHOUT CONTRAST MRA HEAD WITHOUT CONTRAST TECHNIQUE: Multiplanar, multi-echo pulse sequences of the brain and surrounding structures were acquired without intravenous contrast. Angiographic images of the Circle of Willis were acquired using MRA technique without intravenous contrast. COMPARISON:  MRI head November 25, 2020.  MRA  head April 10, 2012. FINDINGS: MR HEAD FINDINGS Brain: Punctate focus of DWI hyperintensity in the right posterior frontal white matter (series 2, image 48) without definite ADC correlate. Similar ventriculomegaly. No evidence of mass lesion, midline shift or acute hemorrhage. Remote infarcts in  the cerebellum bilaterally. Scattered T2/FLAIR hyperintensities in the white matter, compatible with chronic microvascular ischemic disease. Vascular: See below. Skull and upper cervical spine: Normal marrow signal. Sinuses/Orbits: No acute findings. Other: Bilateral mastoid effusions. MRA HEAD FINDINGS Significantly motion limited study.  Within this limitation: Anterior circulation: Bilateral intracranial ICAs, proximal MCAs and proximal ACAs are patent. Posterior circulation: Bilateral intradural vertebral arteries, basilar artery and proximal posterior cerebral arteries are patent. IMPRESSION: MRI head: 1. Punctate focus of DWI hyperintensity in the right posterior frontal white matter without definite ADC correlate. This could represent a tiny subacute infarct versus artifact. 2. Similar ventriculomegaly, mildly disc portion it to sulcal enlargement. This could represent central predominant atrophy or normal pressure hydrocephalus. 3. Chronic microvascular ischemic disease and bilateral remote cerebellar infarcts. 4. Bilateral mastoid effusions. MRA: Significantly motion limited study without evidence of proximal large vessel occlusion. Electronically Signed   By: Margaretha Sheffield M.D.   On: 04/01/2022 14:59   DG Chest 2 View  Result Date: 04/01/2022 CLINICAL DATA:  Altered mental status EXAM: CHEST - 2 VIEW COMPARISON:  02/28/2022 FINDINGS: Previously seen central line has been removed. Stable heart size. Low lung volumes. Trace bilateral pleural effusions, more evident on the left. No airspace consolidation. No pneumothorax. IMPRESSION: Trace bilateral pleural effusions, more evident on the left. Electronically Signed   By: Davina Poke D.O.   On: 04/01/2022 14:03   CT HEAD CODE STROKE WO CONTRAST  Result Date: 04/01/2022 CLINICAL DATA:  Code stroke. EXAM: CT HEAD WITHOUT CONTRAST TECHNIQUE: Contiguous axial images were obtained from the base of the skull through the vertex without intravenous  contrast. RADIATION DOSE REDUCTION: This exam was performed according to the departmental dose-optimization program which includes automated exposure control, adjustment of the mA and/or kV according to patient size and/or use of iterative reconstruction technique. COMPARISON:  03/08/2022 FINDINGS: Brain: No evidence of acute infarction, hemorrhage, cerebral edema, mass, mass effect, or midline shift. No hydrocephalus or extra-axial collection. Diffuse ventricular enlargement, unchanged. Periventricular white matter changes, likely the sequela of chronic small vessel ischemic disease. Vascular: No hyperdense vessel. Skull: Negative for fracture or focal lesion. Sinuses/Orbits: No acute finding. Status post bilateral lens replacements. Other: Fluid in the right-greater-than-left mastoid air cells. ASPECTS [facility] Stroke Program Early CT Score) - Ganglionic level infarction (caudate, lentiform nuclei, internal capsule, insula, M1-M3 cortex): 7 - Supraganglionic infarction (M4-M6 cortex): 3 Total score (0-10 with 10 being normal): 10 IMPRESSION: 1. No acute intracranial process. 2. ASPECTS is 10. Code stroke imaging results were communicated on 04/01/2022 at 1:12 pm to provider BHAGAT via secure text paging. Electronically Signed   By: Merilyn Baba M.D.   On: 04/01/2022 13:12    Pending Labs Unresulted Labs (From admission, onward)     Start     Ordered   04/03/22 0500  CBC  Tomorrow morning,   R        04/02/22 0940   04/03/22 8676  Basic metabolic panel  Tomorrow morning,   R        04/02/22 0940   04/01/22 1810  TSH  Once,   R        04/01/22 1809   04/01/22 1810  Vitamin B1  Once,   R  04/01/22 1809   04/01/22 1810  Urine Culture  (Urine Culture)  Once,   R       Question:  Indication  Answer:  Dysuria   04/01/22 1810   04/01/22 1252  Lactic acid, plasma  Now then every 2 hours,   R      04/01/22 1251   04/01/22 1252  Culture, blood (routine x 2)  BLOOD CULTURE X 2,   R      04/01/22  1251            Vitals/Pain Today's Vitals   04/02/22 1400 04/02/22 1507 04/02/22 1530 04/02/22 1706  BP: 138/78   (!) 172/73  Pulse: 84  76 95  Resp: '14  14 15  '$ Temp:  98.4 F (36.9 C)    TempSrc:  Axillary    SpO2: 100%  99% 99%  PainSc:        Isolation Precautions No active isolations  Medications Medications   stroke: early stages of recovery book (has no administration in time range)  0.9 %  sodium chloride infusion ( Intravenous New Bag/Given 04/01/22 1817)  acetaminophen (TYLENOL) tablet 650 mg (has no administration in time range)    Or  acetaminophen (TYLENOL) 160 MG/5ML solution 650 mg (has no administration in time range)    Or  acetaminophen (TYLENOL) suppository 650 mg (has no administration in time range)  senna-docusate (Senokot-S) tablet 1 tablet (has no administration in time range)  cefTRIAXone (ROCEPHIN) 2 g in sodium chloride 0.9 % 100 mL IVPB (2 g Intravenous New Bag/Given 04/02/22 1707)  levothyroxine (SYNTHROID) tablet 88 mcg (88 mcg Oral Not Given 04/02/22 0800)  brimonidine (ALPHAGAN) 0.2 % ophthalmic solution 1 drop (1 drop Both Eyes Not Given 04/02/22 1636)  dorzolamide-timolol (COSOPT) 2-0.5 % ophthalmic solution 1 drop (1 drop Both Eyes Not Given 04/02/22 1127)  latanoprost (XALATAN) 0.005 % ophthalmic solution 1 drop (1 drop Both Eyes Not Given 04/02/22 0204)  Netarsudil Dimesylate 0.02 % SOLN 1 drop (1 drop Both Eyes Not Given 04/02/22 0459)  pantoprazole (PROTONIX) injection 40 mg (40 mg Intravenous Not Given 04/02/22 1126)  aspirin EC tablet 81 mg ( Oral See Alternative 04/02/22 1120)  atorvastatin (LIPITOR) tablet 20 mg (has no administration in time range)  memantine (NAMENDA) tablet 10 mg (10 mg Oral Not Given 04/02/22 1127)  mirtazapine (REMERON SOL-TAB) disintegrating tablet 15 mg (has no administration in time range)  tamsulosin (FLOMAX) capsule 0.4 mg (has no administration in time range)  enoxaparin (LOVENOX) injection 40 mg (40  mg Subcutaneous Given 04/02/22 1705)    Mobility non-ambulatory High fall risk   Focused Assessments Neuro Assessment Handoff:  Swallow screen pass? Yes    NIH Stroke Scale ( + Modified Stroke Scale Criteria)  Level of Consciousness (1a.)   : Not alert, but arousable by minor stimulation to obey, answer, or respond LOC Questions (1b. )   +: Answers neither question correctly LOC Commands (1c. )   + : Performs one task correctly Best Gaze (2. )  +: Normal Visual (3. )  +: No visual loss Facial Palsy (4. )    : Minor paralysis Motor Arm, Left (5a. )   +: No drift Motor Arm, Right (5b. )   +: No drift Motor Leg, Left (6a. )   +: No drift Motor Leg, Right (6b. )   +: No effort against gravity Limb Ataxia (7. ): Absent Sensory (8. )   +: Normal, no sensory loss Best Language (9. )   +:  Mild-to-moderate aphasia Dysarthria (10. ): Mild-to-moderate dysarthria, patient slurs at least some words and, at worst, can be understood with some difficulty Extinction/Inattention (11.)   +: Visual/tactile/auditory/spatial/personal inattention Modified SS Total  +: 8 Complete NIHSS TOTAL: 11 Last date known well: 04/01/22 Last time known well: 0630 Neuro Assessment: Exceptions to WDL Neuro Checks:      Last Documented NIHSS Modified Score: 8 (04/02/22 1637) Has TPA been given? No If patient is a Neuro Trauma and patient is going to OR before floor call report to [facility] nurse: [phone] or [phone]   R Recommendations: See Admitting Provider Note  Report given to:   Additional Notes:

## 2022-04-02 NOTE — Procedures (Signed)
Patient Name: Mike Mack  MRN: [id number]  Epilepsy Attending: Lora Havens  Referring Physician/Provider: Lorenza Chick, MD  Date: 04/01/2022 Duration: 22.51 mins  Patient history: 86yo M with ams. EG to evaluate for seizure  Level of alertness: Awake, drowsy  AEDs during EEG study: None  Technical aspects: This EEG study was done with scalp electrodes positioned according to the 10-20 International system of electrode placement. Electrical activity was reviewed with band pass filter of 1-'70Hz'$ , sensitivity of 7 uV/mm, display speed of 98m/sec with a '60Hz'$  notched filter applied as appropriate. EEG data were recorded continuously and digitally stored.  Video monitoring was available and reviewed as appropriate.  Description: The posterior dominant rhythm consists of  8 Hz activity of moderate voltage (25-35 uV) seen predominantly in posterior head regions, symmetric and reactive to eye opening and eye closing. Drowsiness was characterized by attenuation of the posterior background rhythm. EEG showed intermittent generalized 3 to 6 Hz theta-delta slowing. Hyperventilation and photic stimulation were not performed.     ABNORMALITY - Intermittent slow, generalized  IMPRESSION: This study is suggestive of mild to moderate diffuse encephalopathy, nonspecific etiology. No seizures or epileptiform discharges were seen throughout the recording.  [name] OBarbra Sarks

## 2022-04-02 NOTE — H&P (Signed)
Consultation Note Date: 04/02/2022   Patient Name: Mike Mack  DOB: November 19, 1935  MRN: [id number]  Age / Sex: 86 y.o., male  PCP: [name], MD Referring Physician: Bonnielee Haff, MD  Reason for Consultation: Establishing goals of care  HPI/Patient Profile: 86 y.o. male  with past medical history of stroke, memory loss, possible parkinsonism, CKD stage 3, hypertension, diabetes, polio, right tibial fracture, recently discharged from rehab stay after admission 9/12-9/21/23 for UTI sepsis with renal failure and GI bleed admitted on 04/01/2022 with stroke.   Clinical Assessment and Goals of Care: I have reviewed records and met with [name]. He is oriented to self only but able to tell me his wife's name. He is jumpy to touch/exam and unable to follow commands. Not able to have conversation with me.   I met later in the day with wife Mike Mack at bedside. SLP joined and assessed during my visit. Nashid was able to finally awaken more and open his eyes. Still very confused and unable to follow commands to cooperate with exam well. He does appear to have hallucinations as he stares and talks into space in the room and reaches with his hand in the air like he is picking something up. Mike Mack shares that he is like this at home but some days he is a little better and able to understand a little more. He just returned home on Wednesday from rehab stay. Their grandson lives there and helps with transfers as Timothey cannot walk. Mike Mack says that he can feed himself and she gives him soft mashed up or cut up small foods but she does assist to feed him at times as well. She reports variable intake and overall poor fluid intake. He is incontinent and uses adult diapers at home. She reports significant decline and weight loss over the past ~1.5 months. She is wondering about his leg fracture and goals here and hoping  that this can be evaluated while he is here in the hospital. She confirms that she would not desire any surgical intervention for [name].   I discussed with Mike Mack his UTI and possible small stroke. I explained to her the effects of previous stroke and vascular disease with dementia. We discussed disease of the brain and how this can change physical functions, swallowing, and appetite. Discussed the challenges and common complications that occur with significant physical debility. We discussed that the road forward will not likely be easy and that his time is likely limited. Mike Mack was not surprised to hear this. She would like to treat potentially reversible conditions like UTI but understands that this is likely to be a reoccurring issues for him. She states that her ultimate goal is for him to be well cared for and comfortable for the rest of his days. I did discuss with her the potential of having hospice care at home and the benefits of helping her provide [name] with care to keep him home and comfortable for the rest of his days. She was initially shocked but  then realized the difference between hospice in the home and hospice facility at end of life (her sister was in hospice facility). She seems open to the idea.   All questions/concerns addressed. Emotional support provided. I will follow up again tomorrow and continue conversation and re-evaluate Mr. Huber progress.   Primary Decision Maker NEXT OF KIN wife Mike Mack    SUMMARY OF RECOMMENDATIONS   - DNR - Treat potentially reversible conditions - I introduced idea of hospice at home today - she sees the value in this but is not sure they are ready for this next step - Not interested in pursuing aggressive/invasive interventions  Code Status/Advance Care Planning: DNR   Symptom Management:  Per attending, neurology.   Palliative Prophylaxis:  Aspiration, Bowel Regimen, Delirium Protocol, Frequent Pain Assessment, Oral Care,  and Turn Reposition  Prognosis:  Overall prognosis poor. Would be eligible for hospice support in the home if aligns with goals - to be determined.   Discharge Planning: To be determined.       Primary Diagnoses: Present on Admission:  Hypertension  CKD (chronic kidney disease) stage 3, GFR 30-59 ml/min (HCC)  acute CVA vs. artifact   Dementia (HCC)  Altered mental status  BPH (benign prostatic hyperplasia)   I have reviewed the medical record, interviewed the patient and family, and examined the patient. The following aspects are pertinent.  Past Medical History:  Diagnosis Date   Anemia 05/13/2013   Arthritis    "right leg" (07/25/2013)   BPH (benign prostatic hyperplasia)    Constipation 05/13/2013   Exertional shortness of breath    "sometimes" (07/25/2013)   History of kidney stones    History of stomach ulcers    Hypertension    Neuropathy    Polio    Polio Childhood   Small bowel obstruction ([place])    Stroke ([place]) 2014   residual:  "left hand kind of numb" (07/25/2013)   Type II diabetes mellitus ([place])    Social History   Socioeconomic History   Marital status: Married    Spouse name: [name]   Number of children: 2   Years of education: Not on file   Highest education level: Not on file  Occupational History   Occupation: Retired, Curator  Tobacco Use   Smoking status: Never   Smokeless tobacco: Never  Vaping Use   Vaping Use: Never used  Substance and Sexual Activity   Alcohol use: No   Drug use: No   Sexual activity: Yes  Other Topics Concern   Not on file  Social History Narrative   Married.  Lives in [place] with wife.  Ambulates with a cane.   Social Determinants of Health   Financial Resource Strain: Not on file  Food Insecurity: Not on file  Transportation Needs: Not on file  Physical Activity: Not on file  Stress: Not on file  Social Connections: Not on file   Family History  Problem Relation Age of Onset   Heart failure  Mother    Hypertension Sister    Diabetes Sister    Diabetes Brother    Hypertension Brother    Scheduled Meds:   stroke: early stages of recovery book   Does not apply Once   brimonidine  1 drop Both Eyes TID   dorzolamide-timolol  1 drop Both Eyes BID   latanoprost  1 drop Both Eyes QHS   levothyroxine  88 mcg Oral QAC breakfast   Netarsudil Dimesylate  1 drop Both Eyes  QHS   pantoprazole (PROTONIX) IV  40 mg Intravenous Q12H   Continuous Infusions:  sodium chloride 75 mL/hr at 04/01/22 1817   cefTRIAXone (ROCEPHIN)  IV Stopped (04/01/22 1845)   PRN Meds:.acetaminophen **OR** acetaminophen (TYLENOL) oral liquid 160 mg/5 mL **OR** acetaminophen, senna-docusate Allergies  Allergen Reactions   Metformin And Related Other (See Comments)    Gi intolerance   [name] [Insulin Glargine-Lixisenatide] Other (See Comments)    Stomach cramps   Review of Systems  Unable to perform ROS: Dementia    Physical Exam Nursing note reviewed.  Constitutional:      General: He is not in acute distress.    Appearance: He is ill-appearing.  Cardiovascular:     Rate and Rhythm: Normal rate.  Pulmonary:     Effort: No tachypnea, accessory muscle usage or respiratory distress.  Abdominal:     General: Abdomen is flat.     Palpations: Abdomen is soft.  Neurological:     Mental Status: He is lethargic and confused.     Comments: Oriented to self only; signs of visual hallucinations     Vital Signs: BP (!) 113/57 (BP Location: Right Arm)   Pulse 83   Temp 98.2 F (36.8 C) (Oral)   Resp 17   SpO2 99%          SpO2: SpO2: 99 % O2 Device:SpO2: 99 % O2 Flow Rate: .   IO: Intake/output summary: No intake or output data in the 24 hours ending 04/02/22 0922  LBM:   Baseline Weight:   Most recent weight:       Palliative Assessment/Data:     Time In: 1120  Time Total: 80 min  Greater than 50%  of this time was spent counseling and coordinating care related to the above  assessment and plan.  Signed by: Vinie Sill, NP Palliative Medicine Team Pager # [phone] (M-F 8a-5p) Team Phone # [phone] (Nights/Weekends)

## 2022-04-02 NOTE — Progress Notes (Signed)
VASCULAR LAB    Attempted carotid duplex at bedside.  Patient is refusing to allow this tech to touch him, guarding his neck, and pushing tech's hands away.  The vascular lab will re-attempt when schedule permits and patient's mental status has improved.     Emmerie Battaglia, RVT 04/02/2022, 8:50 AM

## 2022-04-02 NOTE — ED Notes (Signed)
The pt is more confused for the past 2 hours

## 2022-04-02 NOTE — Evaluation (Signed)
Clinical/Bedside Swallow Evaluation Patient Details  Name: Mike Mack MRN: [id number] Date of Birth: 01/13/1936  Today's Date: 04/02/2022 Time: SLP Start Time (ACUTE ONLY): 6433 SLP Stop Time (ACUTE ONLY): 2951 SLP Time Calculation (min) (ACUTE ONLY): 30 min  Past Medical History:  Past Medical History:  Diagnosis Date   Anemia 05/13/2013   Arthritis    "right leg" (07/25/2013)   BPH (benign prostatic hyperplasia)    Constipation 05/13/2013   Exertional shortness of breath    "sometimes" (07/25/2013)   History of kidney stones    History of stomach ulcers    Hypertension    Neuropathy    Polio    Polio Childhood   Small bowel obstruction ([place])    Stroke ([place]) 2014   residual:  "left hand kind of numb" (07/25/2013)   Type II diabetes mellitus ([place])    Past Surgical History:  Past Surgical History:  Procedure Laterality Date   BIOPSY  11/30/2020   Procedure: BIOPSY;  Surgeon: Ronnette Juniper, MD;  Location: [facility];  Service: Gastroenterology;;   BIOPSY  03/03/2022   Procedure: BIOPSY;  Surgeon: Clarene Essex, MD;  Location: [facility];  Service: Gastroenterology;;   BOWEL RESECTION     CATARACT EXTRACTION W/ INTRAOCULAR LENS  IMPLANT, BILATERAL Bilateral 2000's   CHOLECYSTECTOMY N/A 07/25/2013   Procedure: LAPAROSCOPIC CHOLECYSTECTOMY WITH INTRAOPERATIVE CHOLANGIOGRAM;  Surgeon: [name], MD;  Location: [facility];  Service: General;  Laterality: N/A;   COLON SURGERY     COLONOSCOPY     Hx: of   CYSTOSCOPY WITH BIOPSY N/A 12/01/2019   Procedure: CYSTOSCOPY WITH BLADDER BIOPSY AND FULGURATION;  Surgeon: Lucas Mallow, MD;  Location: [facility];  Service: Urology;  Laterality: N/A;   ESOPHAGOGASTRODUODENOSCOPY N/A 11/30/2020   Procedure: ESOPHAGOGASTRODUODENOSCOPY (EGD);  Surgeon: Ronnette Juniper, MD;  Location: [facility];  Service: Gastroenterology;  Laterality: N/A;   ESOPHAGOGASTRODUODENOSCOPY N/A 03/03/2022   Procedure: ESOPHAGOGASTRODUODENOSCOPY (EGD);  Surgeon: Clarene Essex, MD;  Location: [facility];  Service: Gastroenterology;  Laterality: N/A;   EXCISIONAL HEMORRHOIDECTOMY  2000's   EXTRACORPOREAL SHOCK WAVE LITHOTRIPSY Right 10/03/2018   Procedure: EXTRACORPOREAL SHOCK WAVE LITHOTRIPSY (ESWL);  Surgeon: Festus Aloe, MD;  Location: [facility];  Service: Urology;  Laterality: Right;   EYE SURGERY     INGUINAL HERNIA REPAIR Right 1970's   IR RADIOLOGIST EVAL & MGMT  04/06/2020   LAPAROSCOPIC CHOLECYSTECTOMY  07/25/2013   w/LOA (07/25/2013)   HPI:  86 y/o male admitted with AMS, found slumped over in chair, not talking by spouse. MRI shows possible tiny subacute infarct in the posterior frontal white matter versus artifact.    Assessment / Plan / Recommendation  Clinical Impression  Patient presents with a cognitively based oral dysphagia characterized by significant confusion, decreased awareness and attention, inability to follow commands, resulting in poor awareness of bolus. Following significant coaxing and contextual cues, patient agreeable to intake of pureed solid and thin liquids via straw which he consumed with no overt s/s of aspiration. Soft solids however were held in oral cavity with eventual expectoration, seemingly poor awareness of what to do to masticate bolus. Per wife, mentation fluctuates at baseline. She reports relatively good po intake however this SLP suspects that quantity and safety of po intake fluctuates with flucutating mentation. Definite concern for dehydration/malnutrition despite a seemingly intact oropharyngeal swallow from a musculature and movement standpoint. Will initiate a pureed diet with thin liquids and f/u for potential to advance with improved mentation. SLP Visit Diagnosis: Dysphagia, oral  phase (R13.11)    Aspiration Risk       Diet Recommendation Dysphagia 1 (Puree);Thin liquid   Liquid Administration via: Cup;Straw Medication Administration: Whole meds with puree Supervision: Staff to assist with self  feeding;Full supervision/cueing for compensatory strategies Compensations: Slow rate;Small sips/bites Postural Changes: Seated upright at 90 degrees    Other  Recommendations Oral Care Recommendations: Oral care BID    Recommendations for follow up therapy are one component of a multi-disciplinary discharge planning process, led by the attending physician.  Recommendations may be updated based on patient status, additional functional criteria and insurance authorization.  Follow up Recommendations Skilled nursing-short term rehab (<3 hours/day)      Assistance Recommended at Discharge Frequent or constant Supervision/Assistance     Frequency and Duration min 2x/week  1 week       Prognosis Barriers to Reach Goals: Cognitive deficits      Swallow Study   General HPI: 86 y/o male admitted with AMS, found slumped over in chair, not talking by spouse. MRI shows possible tiny subacute infarct in the posterior frontal white matter versus artifact. Type of Study: Bedside Swallow Evaluation Previous Swallow Assessment: seen by SLP in 02/2022, ultimate recommendations for dysphagia 3, thin liquid Diet Prior to this Study: NPO Temperature Spikes Noted: No Respiratory Status: Room air History of Recent Intubation: No Behavior/Cognition: Alert;Distractible;Requires cueing;Doesn't follow directions;Confused;Agitated Oral Cavity Assessment: Within Functional Limits Oral Care Completed by SLP: No Oral Cavity - Dentition: Dentures, not available (per spouse, bottom dentition loose) Vision: Functional for self-feeding Self-Feeding Abilities: Total assist Patient Positioning: Upright in bed Baseline Vocal Quality: Normal Volitional Cough: Cognitively unable to elicit Volitional Swallow: Unable to elicit    Oral/Motor/Sensory Function Overall Oral Motor/Sensory Function: Within functional limits (appears, unable to complete formal OME)   Ice Chips Ice chips: Not tested   Thin Liquid Thin  Liquid: Within functional limits Presentation: Straw    Nectar Thick Nectar Thick Liquid: Not tested   Honey Thick Honey Thick Liquid: Not tested   Puree Puree: Within functional limits Presentation: Spoon   Solid     Solid: Impaired Presentation: Spoon Oral Phase Impairments: Poor awareness of bolus (eventually expectorated) Oral Phase Functional Implications: Oral holding     Jeweline Reif MA, CCC-SLP  Chariah Bailey Meryl 04/02/2022,2:43 PM

## 2022-04-02 NOTE — ED Notes (Signed)
Speech at bedside

## 2022-04-02 NOTE — ED Notes (Signed)
Attempted swallow screen. Pt required assistance directing cup to lips. Pt had difficulty understanding to drink all the water. Pt able to swallow on mouthful. No coughing noted.

## 2022-04-02 NOTE — Consult Note (Signed)
Consultation Note Date: 04/02/2022   Patient Name: Mike Mack  DOB: August 16, 1935  MRN: [id number]  Age / Sex: 86 y.o., male  PCP: [name], MD Referring Physician: Bonnielee Haff, MD  Reason for Consultation: Establishing goals of care  HPI/Patient Profile: 86 y.o. male  with past medical history of stroke, memory loss, possible parkinsonism, CKD stage 3, hypertension, diabetes, polio, right tibial fracture, recently discharged from rehab stay after admission 9/12-9/21/23 for UTI sepsis with renal failure and GI bleed admitted on 04/01/2022 with stroke.   Clinical Assessment and Goals of Care: I have reviewed records and met with [name]. He is oriented to self only but able to tell me his wife's name. He is jumpy to touch/exam and unable to follow commands. Not able to have conversation with me.   I met later in the day with wife Mike Mack at bedside. SLP joined and assessed during my visit. Treyten was able to finally awaken more and open his eyes. Still very confused and unable to follow commands to cooperate with exam well. He does appear to have hallucinations as he stares and talks into space in the room and reaches with his hand in the air like he is picking something up. Mike Mack shares that he is like this at home but some days he is a little better and able to understand a little more. He just returned home on Wednesday from rehab stay. Their grandson lives there and helps with transfers as Claudis cannot walk. Mike Mack says that he can feed himself and she gives him soft mashed up or cut up small foods but she does assist to feed him at times as well. She reports variable intake and overall poor fluid intake. He is incontinent and uses adult diapers at home. She reports significant decline and weight loss over the past ~1.5 months. She is wondering about his leg fracture and goals here and hoping that this can be  evaluated while he is here in the hospital. She confirms that she would not desire any surgical intervention for [name].   I discussed with Mike Mack his UTI and possible small stroke. I explained to her the effects of previous stroke and vascular disease with dementia. We discussed disease of the brain and how this can change physical functions, swallowing, and appetite. Discussed the challenges and common complications that occur with significant physical debility. We discussed that the road forward will not likely be easy and that his time is likely limited. Mike Mack was not surprised to hear this. She would like to treat potentially reversible conditions like UTI but understands that this is likely to be a reoccurring issues for him. She states that her ultimate goal is for him to be well cared for and comfortable for the rest of his days. I did discuss with her the potential of having hospice care at home and the benefits of helping her provide [name] with care to keep him home and comfortable for the rest of his days. She was initially shocked but then realized the difference between hospice in the home and hospice facility at end of life (her sister was in hospice facility). She seems open to the idea.   All questions/concerns addressed. Emotional support provided. I will follow up again tomorrow and continue conversation and re-evaluate [name] progress.   Primary Decision Maker NEXT OF KIN wife Mike Mack    SUMMARY OF RECOMMENDATIONS   - DNR - Treat potentially reversible conditions - I introduced idea of hospice  at home today - she sees the value in this but is not sure they are ready for this next step - Not interested in pursuing aggressive/invasive interventions  Code Status/Advance Care Planning: DNR   Symptom Management:  Per attending, neurology.   Palliative Prophylaxis:  Aspiration, Bowel Regimen, Delirium Protocol, Frequent Pain Assessment, Oral Care, and Turn  Reposition  Prognosis:  Overall prognosis poor. Would be eligible for hospice support in the home if aligns with goals - to be determined.   Discharge Planning: To be determined.       Primary Diagnoses: Present on Admission:  Hypertension  CKD (chronic kidney disease) stage 3, GFR 30-59 ml/min (HCC)  acute CVA vs. artifact   Dementia (HCC)  Altered mental status  BPH (benign prostatic hyperplasia)   I have reviewed the medical record, interviewed the patient and family, and examined the patient. The following aspects are pertinent.  Past Medical History:  Diagnosis Date   Anemia 05/13/2013   Arthritis    "right leg" (07/25/2013)   BPH (benign prostatic hyperplasia)    Constipation 05/13/2013   Exertional shortness of breath    "sometimes" (07/25/2013)   History of kidney stones    History of stomach ulcers    Hypertension    Neuropathy    Polio    Polio Childhood   Small bowel obstruction ([place])    Stroke ([place]) 2014   residual:  "left hand kind of numb" (07/25/2013)   Type II diabetes mellitus ([place])    Social History   Socioeconomic History   Marital status: Married    Spouse name: Mike Mack   Number of children: 2   Years of education: Not on file   Highest education level: Not on file  Occupational History   Occupation: Retired, Curator  Tobacco Use   Smoking status: Never   Smokeless tobacco: Never  Vaping Use   Vaping Use: Never used  Substance and Sexual Activity   Alcohol use: No   Drug use: No   Sexual activity: Yes  Other Topics Concern   Not on file  Social History Narrative   Married.  Lives in [place] with wife.  Ambulates with a cane.   Social Determinants of Health   Financial Resource Strain: Not on file  Food Insecurity: Not on file  Transportation Needs: Not on file  Physical Activity: Not on file  Stress: Not on file  Social Connections: Not on file   Family History  Problem Relation Age of Onset   Heart failure Mother     Hypertension Sister    Diabetes Sister    Diabetes Brother    Hypertension Brother    Scheduled Meds:   stroke: early stages of recovery book   Does not apply Once   brimonidine  1 drop Both Eyes TID   dorzolamide-timolol  1 drop Both Eyes BID   latanoprost  1 drop Both Eyes QHS   levothyroxine  88 mcg Oral QAC breakfast   Netarsudil Dimesylate  1 drop Both Eyes QHS   pantoprazole (PROTONIX) IV  40 mg Intravenous Q12H   Continuous Infusions:  sodium chloride 75 mL/hr at 04/01/22 1817   cefTRIAXone (ROCEPHIN)  IV Stopped (04/01/22 1845)   PRN Meds:.acetaminophen **OR** acetaminophen (TYLENOL) oral liquid 160 mg/5 mL **OR** acetaminophen, senna-docusate Allergies  Allergen Reactions   Metformin And Related Other (See Comments)    Gi intolerance   [name] [Insulin Glargine-Lixisenatide] Other (See Comments)    Stomach cramps   Review of  Systems  Unable to perform ROS: Dementia    Physical Exam Nursing note reviewed.  Constitutional:      General: He is not in acute distress.    Appearance: He is ill-appearing.  Cardiovascular:     Rate and Rhythm: Normal rate.  Pulmonary:     Effort: No tachypnea, accessory muscle usage or respiratory distress.  Abdominal:     General: Abdomen is flat.     Palpations: Abdomen is soft.  Neurological:     Mental Status: He is lethargic and confused.     Comments: Oriented to self only; signs of visual hallucinations     Vital Signs: BP (!) 113/57 (BP Location: Right Arm)   Pulse 83   Temp 98.2 F (36.8 C) (Oral)   Resp 17   SpO2 99%          SpO2: SpO2: 99 % O2 Device:SpO2: 99 % O2 Flow Rate: .   IO: Intake/output summary: No intake or output data in the 24 hours ending 04/02/22 0922  LBM:   Baseline Weight:   Most recent weight:       Palliative Assessment/Data:     Time In: 1120  Time Total: 80 min  Greater than 50%  of this time was spent counseling and coordinating care related to the above assessment and  plan.  Signed by: Vinie Sill, NP Palliative Medicine Team Pager # [phone] (M-F 8a-5p) Team Phone # [phone] (Nights/Weekends)

## 2022-04-02 NOTE — Progress Notes (Signed)
Patient arrived in the unit at 2200 from [facility], A&Ox1, initiated telemetry monitor, and will continue to monitor.

## 2022-04-02 NOTE — Progress Notes (Addendum)
STROKE TEAM PROGRESS NOTE   INTERVAL HISTORY Patient was confused on assessment. Eventually was able to follow commands and responded to questions. He knew his name.  Vitals:   04/02/22 0830 04/02/22 0930 04/02/22 1030 04/02/22 1045  BP: 118/84 131/66 (!) 132/90 (!) 136/59  Pulse:    82  Resp: 19 19 (!) 23 16  Temp:    98.6 F (37 C)  TempSrc:    Oral  SpO2:    97%   CBC:  Recent Labs  Lab 04/01/22 1250 04/01/22 1254  WBC 10.9*  --   NEUTROABS 9.1*  --   HGB 10.6* 12.2*  HCT 34.0* 36.0*  MCV 100.3*  --   PLT 340  --    Basic Metabolic Panel:  Recent Labs  Lab 04/01/22 1250 04/01/22 1254 04/01/22 2145  NA 137 140  --   K 4.3 4.6  --   CL 106 105  --   CO2 22  --   --   GLUCOSE 189* 188*  --   BUN 30* 35*  --   CREATININE 1.99* 1.90*  --   CALCIUM 8.7*  --   --   MG  --   --  2.0   Lipid Panel:  Recent Labs  Lab 04/01/22 2145  CHOL 94  TRIG 59  HDL 44  CHOLHDL 2.1  VLDL 12  LDLCALC 38   HgbA1c:  Recent Labs  Lab 04/01/22 1250  HGBA1C 5.5   Urine Drug Screen:  Recent Labs  Lab 04/01/22 1639  LABOPIA NONE DETECTED  COCAINSCRNUR NONE DETECTED  LABBENZ NONE DETECTED  AMPHETMU NONE DETECTED  THCU NONE DETECTED  LABBARB NONE DETECTED    Alcohol Level  Recent Labs  Lab 04/01/22 1250  ETH <10    IMAGING past 24 hours EEG adult  Result Date: 04/02/2022 Lora Havens, MD     04/02/2022  8:16 AM Patient Name: Mike Mack MRN: [id number] Epilepsy Attending: Lora Havens Referring Physician/Provider: Lorenza Chick, MD Date: 04/01/2022 Duration: 22.51 mins Patient history: 86yo M with ams. EG to evaluate for seizure Level of alertness: Awake, drowsy AEDs during EEG study: None Technical aspects: This EEG study was done with scalp electrodes positioned according to the 10-20 International system of electrode placement. Electrical activity was reviewed with band pass filter of 1-'70Hz'$ , sensitivity of 7 uV/mm, display speed of 23m/sec with  a '60Hz'$  notched filter applied as appropriate. EEG data were recorded continuously and digitally stored.  Video monitoring was available and reviewed as appropriate. Description: The posterior dominant rhythm consists of  8 Hz activity of moderate voltage (25-35 uV) seen predominantly in posterior head regions, symmetric and reactive to eye opening and eye closing. Drowsiness was characterized by attenuation of the posterior background rhythm. EEG showed intermittent generalized 3 to 6 Hz theta-delta slowing. Hyperventilation and photic stimulation were not performed.   ABNORMALITY - Intermittent slow, generalized IMPRESSION: This study is suggestive of mild to moderate diffuse encephalopathy, nonspecific etiology. No seizures or epileptiform discharges were seen throughout the recording. PLora Havens  MR BRAIN WO CONTRAST  Result Date: 04/01/2022 CLINICAL DATA:  Neuro deficit, acute, stroke suspected EXAM: MRI HEAD WITHOUT CONTRAST MRA HEAD WITHOUT CONTRAST TECHNIQUE: Multiplanar, multi-echo pulse sequences of the brain and surrounding structures were acquired without intravenous contrast. Angiographic images of the Circle of Willis were acquired using MRA technique without intravenous contrast. COMPARISON:  MRI head November 25, 2020.  MRA head April 10, 2012. FINDINGS: MR HEAD  FINDINGS Brain: Punctate focus of DWI hyperintensity in the right posterior frontal white matter (series 2, image 48) without definite ADC correlate. Similar ventriculomegaly. No evidence of mass lesion, midline shift or acute hemorrhage. Remote infarcts in the cerebellum bilaterally. Scattered T2/FLAIR hyperintensities in the white matter, compatible with chronic microvascular ischemic disease. Vascular: See below. Skull and upper cervical spine: Normal marrow signal. Sinuses/Orbits: No acute findings. Other: Bilateral mastoid effusions. MRA HEAD FINDINGS Significantly motion limited study.  Within this limitation: Anterior  circulation: Bilateral intracranial ICAs, proximal MCAs and proximal ACAs are patent. Posterior circulation: Bilateral intradural vertebral arteries, basilar artery and proximal posterior cerebral arteries are patent. IMPRESSION: MRI head: 1. Punctate focus of DWI hyperintensity in the right posterior frontal white matter without definite ADC correlate. This could represent a tiny subacute infarct versus artifact. 2. Similar ventriculomegaly, mildly disc portion it to sulcal enlargement. This could represent central predominant atrophy or normal pressure hydrocephalus. 3. Chronic microvascular ischemic disease and bilateral remote cerebellar infarcts. 4. Bilateral mastoid effusions. MRA: Significantly motion limited study without evidence of proximal large vessel occlusion. Electronically Signed   By: Margaretha Sheffield M.D.   On: 04/01/2022 14:59   MR ANGIO HEAD WO CONTRAST  Result Date: 04/01/2022 CLINICAL DATA:  Neuro deficit, acute, stroke suspected EXAM: MRI HEAD WITHOUT CONTRAST MRA HEAD WITHOUT CONTRAST TECHNIQUE: Multiplanar, multi-echo pulse sequences of the brain and surrounding structures were acquired without intravenous contrast. Angiographic images of the Circle of Willis were acquired using MRA technique without intravenous contrast. COMPARISON:  MRI head November 25, 2020.  MRA head April 10, 2012. FINDINGS: MR HEAD FINDINGS Brain: Punctate focus of DWI hyperintensity in the right posterior frontal white matter (series 2, image 48) without definite ADC correlate. Similar ventriculomegaly. No evidence of mass lesion, midline shift or acute hemorrhage. Remote infarcts in the cerebellum bilaterally. Scattered T2/FLAIR hyperintensities in the white matter, compatible with chronic microvascular ischemic disease. Vascular: See below. Skull and upper cervical spine: Normal marrow signal. Sinuses/Orbits: No acute findings. Other: Bilateral mastoid effusions. MRA HEAD FINDINGS Significantly motion limited  study.  Within this limitation: Anterior circulation: Bilateral intracranial ICAs, proximal MCAs and proximal ACAs are patent. Posterior circulation: Bilateral intradural vertebral arteries, basilar artery and proximal posterior cerebral arteries are patent. IMPRESSION: MRI head: 1. Punctate focus of DWI hyperintensity in the right posterior frontal white matter without definite ADC correlate. This could represent a tiny subacute infarct versus artifact. 2. Similar ventriculomegaly, mildly disc portion it to sulcal enlargement. This could represent central predominant atrophy or normal pressure hydrocephalus. 3. Chronic microvascular ischemic disease and bilateral remote cerebellar infarcts. 4. Bilateral mastoid effusions. MRA: Significantly motion limited study without evidence of proximal large vessel occlusion. Electronically Signed   By: Margaretha Sheffield M.D.   On: 04/01/2022 14:59   DG Chest 2 View  Result Date: 04/01/2022 CLINICAL DATA:  Altered mental status EXAM: CHEST - 2 VIEW COMPARISON:  02/28/2022 FINDINGS: Previously seen central line has been removed. Stable heart size. Low lung volumes. Trace bilateral pleural effusions, more evident on the left. No airspace consolidation. No pneumothorax. IMPRESSION: Trace bilateral pleural effusions, more evident on the left. Electronically Signed   By: Davina Poke D.O.   On: 04/01/2022 14:03   CT HEAD CODE STROKE WO CONTRAST  Result Date: 04/01/2022 CLINICAL DATA:  Code stroke. EXAM: CT HEAD WITHOUT CONTRAST TECHNIQUE: Contiguous axial images were obtained from the base of the skull through the vertex without intravenous contrast. RADIATION DOSE REDUCTION: This exam was performed according to  the departmental dose-optimization program which includes automated exposure control, adjustment of the mA and/or kV according to patient size and/or use of iterative reconstruction technique. COMPARISON:  03/08/2022 FINDINGS: Brain: No evidence of acute  infarction, hemorrhage, cerebral edema, mass, mass effect, or midline shift. No hydrocephalus or extra-axial collection. Diffuse ventricular enlargement, unchanged. Periventricular white matter changes, likely the sequela of chronic small vessel ischemic disease. Vascular: No hyperdense vessel. Skull: Negative for fracture or focal lesion. Sinuses/Orbits: No acute finding. Status post bilateral lens replacements. Other: Fluid in the right-greater-than-left mastoid air cells. ASPECTS [facility] Stroke Program Early CT Score) - Ganglionic level infarction (caudate, lentiform nuclei, internal capsule, insula, M1-M3 cortex): 7 - Supraganglionic infarction (M4-M6 cortex): 3 Total score (0-10 with 10 being normal): 10 IMPRESSION: 1. No acute intracranial process. 2. ASPECTS is 10. Code stroke imaging results were communicated on 04/01/2022 at 1:12 pm to provider BHAGAT via secure text paging. Electronically Signed   By: Merilyn Baba M.D.   On: 04/01/2022 13:12    PHYSICAL EXAM General: Irritable and confused in bed. No acute distress. Skin: Warm and dry. No obvious rash or lesions. Neuro: A&Ox1, knew his name but did not answer when asked the place or date. Makes irrelevant responses at times but able to say full sentences. Able to follow some commands but did not open eyes when asked. Bilateral eye tracking present. Facial movements in tact. Normal sensation except for RLE.  5/5 strength in RUE, LUE, 0/5 strength RLE, and 5/5 strength LLE. Cerebellar evaluation limited. Psych: Normal mood and affect   ASSESSMENT/PLAN Mike Mack is a 86 y.o. male with medical history significant of T2DM, HTN, hx of CVA, memory loss, CKD stage 3, polio  Who presented to ED with AMS.  Possible stroke, incidental finding:  tiny subacute infarct versus artifact in the right posterior frontal white matter, likely small vessel disease  Code Stroke CT head No acute abnormality. ASPECTS 10.    MRI  Punctate focus of DWI  hyperintensity in the right posterior frontal white matter without definite ADC correlate. This could represent a tiny subacute infarct versus artifact. Chronic microvascular ischemic disease and bilateral remote cerebellar infarcts. MRA head Significantly motion limited study without evidence of proximal large vessel occlusion. VAS [place] pending 2D Echo pending LDL 38 HgbA1c 5.5 VTE prophylaxis - Lovenox No antithrombotic prior to admission, now on ASA '81mg'$ , no DAPT due to GI bleed history one month ago. Therapy recommendations:  pending Disposition:  home  Encephalopathy Likely 2/2 UTI, urosepsis, AKI, hypothermia, hypotension.  EEG was negative for seizures and suggestive of mild to moderate diffuse encephalopathy Manage per primary  UTI Urosepsis Presented with hypotension and hypothermia UA WBC > 50 Urine culture pending Leukocytosis WBC 10.9 History of urosepsis, septic shock and AKI in 02/2022  AKI -Creatinine 1.99-1.90  -Baseline creatinine normal -On IV fluid -Continue to monitor   History of stroke 03/2012 admitted for right-sided weakness and numbness.  MRI showed left thalamic infarct.  LDL 93, A1c 7.9, EF 65 to 70%, MRI and carotid Doppler unremarkable.  Discharged on aspirin.  Hypotension Hx of Hypertension Home meds:  norvasc, lopressor, benicar Low BP on presentation 90s Now BP stable, on IVF Long-term BP goal normotensive  Hyperlipidemia Home meds:  lipitor 20, restarted in hospital LDL 38, goal < 70 Continue statin at discharge  Diabetes type II Uncontrolled Home meds:  insulin 14 units HgbA1c 7.5, goal < 7.0 CBGs SSI Close PCP follow-up for better DM control  Other Stroke Risk  Factors Advanced Age >/= 47   Other Active Problems  Dementia (HCC) -Difficult to say if his AMS is near baseline. -ex vacuo vs. NPH on serial CT head -Delirium precautions -Continue namenda and remeron   Hypothermia Infectious vs. Age/inactivity and low body  weight -Cultures pending, continue rocephin -Room warm with blankets and temp improving.  -Check TSH    recent GI bleed in 02/2022 -S/p EGD which showed gastric and duodenal ulcers and erosion without active bleeding. S/p 1 unit RBC -ASA was held PTA -Has not been taking his PPI '40mg'$  BID x 6 weeks -Starting IV while NPO -resume ASA 81    Right tibial fracture Right LE polio  Fell in August and broke leg Non weight bearing Has not followed up with ortho as he keeps ending up back in hospital  He was bedbound before he fell    Hospital day # 1  Stormy Fabian, MD PGY-1 Psychiatry  ATTENDING NOTE: I reviewed above note and agree with the assessment and plan. Pt was seen and examined.   86 year old male with history of right lower extremity polio, diabetes, hypertension, hyperlipidemia, dementia, wheelchair-bound at home, stroke in 03/2012 admitted for stumble over at home and not speaking.  Patient was recently admitted for septic shock last months with UTI, AKI and GI bleeding.  Status post PRBC and aspirin was on hold.  Patient was found slumped over in chair at home, not speaking, EMS found patient to be hypotensive and hypothermia.  CT no acute abnormality.  MRI showed punctate right CR infarct with chronic ventriculomegaly.  MRA unremarkable.  EEG no seizure.  Carotid Doppler and 2D echo pending.  LDL 38, A1c 5.5, UDS negative.  Ammonia level normal.  UA WBC more than 50, urine culture pending.  WBC 10.9, hemoglobin 10.6-12.2, creatinine 1.99-1.90.  On exam, no family at bedside.  Patient eyes closed, intermittently open as request, eyes midline, tracking bilaterally, blinking to be described bilaterally.  Not answer orientation questions, able to speak meaningful sentences but not follow commands, mild dysarthria.  No facial droop.  Bilateral upper extremity strong resistance, left lower extremity strong withdraw to pain stimulation, right lower extremity no movement on pain  stimulation.  Patient possible punctate stroke on MRI likely incidental finding from small vessel disease, not able to explain patient encephalopathy which likely due to UTI, urosepsis, AKI.  Currently on Rocephin, IV fluid.  BP much improved, at 130s.  Passed swallow, on diet, put on aspirin 81.  Continue home statin.  Encephalopathy treatment per primary team.  PT/OT pending.  For detailed assessment and plan, please refer to above/below as I have made changes wherever appropriate.   Rosalin Hawking, MD PhD Stroke Neurology 04/02/2022 2:52 PM   To contact Stroke Continuity provider, please refer to [email or web address] After hours, contact General Neurology

## 2022-04-02 NOTE — Progress Notes (Signed)
[facility] PROGRESS NOTE   Mike Mack IWL:[id number] DOB: 1935/10/31 DOA: 04/01/2022  PCP: [name], MD  Brief History/Interval Summary: 86 y.o. male with medical history significant of T2DM, HTN, hx of CVA, memory loss, CKD stage 3, polio who presented to ED with AMS. His wife gave him his breakfast at 6:30am and she states he was his normal self. At 8am she got him up and dressed and in his WC.  She went to go check on him around 11-11:30 and he was slumped over and not talking to her. Ems was called. At baseline he carries on a normal conversation.  He knows family members, doesn't know date and sometimes confused as to where he is. Needs help with all ADLs. He does feed himself.  Patient was found to have a small stroke in the right hemisphere.  Neurology was consulted.  Patient was hospitalized for further management. Just admitted to [facility] from 9/12-9/21/23 for septic shock secondary to UTI and required stay in ICU on pressors. Also had acute on chronic renal failure and GI bleeding. EGD showed gastric and duodenal ulcers and erosion without active bleeding. S/p 1 unit PRBC.    Consultants: Neurology.  Palliative care  Procedures: EEG    Subjective/Interval History: Patient noted to be confused.  Occasional agitation is noted.  Unable to obtain any history from him.   Assessment/Plan:  Acute stroke/acute metabolic encephalopathy Apparently he was unresponsive and slumped over on his wheelchair at home.  MRI shows possible tiny subacute infarct in the posterior frontal white matter versus artifact.  Neurology is following.  To be started on aspirin. EEG does not show any seizure activity.   Echocardiogram is pending. LDL manage A1c is pending. PT and OT evaluation although at baseline he needs help with all ADLs. Patient also appears to have significant cognitive dysfunction.  He has a diagnosis of dementia. Palliative care consultation has been requested  which is reasonable.  Acute kidney injury Presented with creatinine of 1.99.  His creatinine was 1.12 back in September.  Continue with IV fluids.  Monitor urine output.  Avoid nephrotoxic agents.  Recheck labs tomorrow.  Essential hypertension Amlodipine and metoprolol and olmesartan on hold to allow permissive hypertension.  Blood pressure noted to be borderline low to begin with.  Urinary tract infection Recent admission for UTI and septic shock noted.  UA is noted to be abnormal.  Continue with ceftriaxone.  Follow-up on urine cultures.  Interestingly no organism was identified in his urine back in September.  Hypothermia Temperature was 96.2 F at initial assessment.  Subsequent temperatures have been normal.  Continue to monitor.  Recent GI bleed in September 2023 He status post EGD which showed gastric and duodenal ulcers without any active bleeding.  He required 1 unit of transfusion during this admission.  Aspirin was held at that time.  He was asked to take PPI twice a day but looks like he had not been taking it.  PPI initiated here.  Since he has had a stroke we could challenge him with aspirin and see how he does.  Watch for signs of bleeding.  Right tibial fracture He fell in August and broke his leg.  Nonweightbearing.  Has not followed up with orthopedics as he has been hospitalized 2 times now.  He was bedbound even before he fell.  He was supposed to see Dr. Stann Mainland.  This will need to be addressed at discharge.  Dementia At home he is on Namenda and  Remeron.  Delirium precautions.  Diabetes mellitus type 2 Recent HbA1c was 7.5.  Continue SSI.  History of glaucoma Continue with eyedrops.  Hypothyroidism Continue levothyroxine.  BPH Continue Flomax.  DVT Prophylaxis: SCDs Code Status: DNR Family Communication: No family at bedside. Disposition Plan: To be determined  Status is: Inpatient Remains inpatient appropriate because: Acute  stroke      Medications: Scheduled:   stroke: early stages of recovery book   Does not apply Once   aspirin  300 mg Rectal Daily   Or   aspirin EC  81 mg Oral Daily   brimonidine  1 drop Both Eyes TID   dorzolamide-timolol  1 drop Both Eyes BID   latanoprost  1 drop Both Eyes QHS   levothyroxine  88 mcg Oral QAC breakfast   Netarsudil Dimesylate  1 drop Both Eyes QHS   pantoprazole (PROTONIX) IV  40 mg Intravenous Q12H   Continuous:  sodium chloride 75 mL/hr at 04/01/22 1817   cefTRIAXone (ROCEPHIN)  IV Stopped (04/01/22 1845)   ZOX:[id number] **OR** acetaminophen (TYLENOL) oral liquid 160 mg/5 mL **OR** acetaminophen, senna-docusate  Antibiotics: Anti-infectives (From admission, onward)    Start     Dose/Rate Route Frequency Ordered Stop   04/01/22 1745  cefTRIAXone (ROCEPHIN) 2 g in sodium chloride 0.9 % 100 mL IVPB        2 g 200 mL/hr over 30 Minutes Intravenous Every 24 hours 04/01/22 1744     04/01/22 1530  cefTRIAXone (ROCEPHIN) 2 g in sodium chloride 0.9 % 100 mL IVPB  Status:  Discontinued        2 g 200 mL/hr over 30 Minutes Intravenous Every 24 hours 04/01/22 1520 04/01/22 1743       Objective:  Vital Signs  Vitals:   04/02/22 0045 04/02/22 0115 04/02/22 0200 04/02/22 0730  BP: (!) 105/55 116/62 (!) 103/55 (!) 113/57  Pulse:    83  Resp: '14 17 15 17  '$ Temp:    98.2 F (36.8 C)  TempSrc:    Oral  SpO2:    99%   No intake or output data in the 24 hours ending 04/02/22 0923 There were no vitals filed for this visit.  General appearance: Awake alert.  In no distress.  Confused and distracted Resp: Clear to auscultation bilaterally.  Normal effort Cardio: S1-S2 is normal regular.  No S3-S4.  No rubs murmurs or bruit GI: Abdomen is soft.  Nontender nondistended.  Bowel sounds are present normal.  No masses organomegaly Extremities: Brace noted over the right lower extremity Neurologic: Disoriented.  Not cooperating with examination.  Keeps pushing me  away.   Lab Results:  Data Reviewed: I have personally reviewed following labs and reports of the imaging studies  CBC: Recent Labs  Lab 04/01/22 1250 04/01/22 1254  WBC 10.9*  --   NEUTROABS 9.1*  --   HGB 10.6* 12.2*  HCT 34.0* 36.0*  MCV 100.3*  --   PLT 340  --     Basic Metabolic Panel: Recent Labs  Lab 04/01/22 1250 04/01/22 1254 04/01/22 2145  NA 137 140  --   K 4.3 4.6  --   CL 106 105  --   CO2 22  --   --   GLUCOSE 189* 188*  --   BUN 30* 35*  --   CREATININE 1.99* 1.90*  --   CALCIUM 8.7*  --   --   MG  --   --  2.0  GFR: CrCl cannot be calculated (Unknown ideal weight.).  Liver Function Tests: Recent Labs  Lab 04/01/22 1250  AST 24  ALT 14  ALKPHOS 124  BILITOT 0.8  PROT 7.0  ALBUMIN 2.7*    Recent Labs  Lab 04/01/22 1250  AMMONIA 20    Coagulation Profile: Recent Labs  Lab 04/01/22 1250  INR 1.3*     CBG: Recent Labs  Lab 04/01/22 1305  GLUCAP 150*     Anemia Panel: Recent Labs    04/01/22 2145  VITAMINB12 1,169*    Recent Results (from the past 240 hour(s))  Resp Panel by RT-PCR (Flu A&B, Covid) Anterior Nasal Swab     Status: None   Collection Time: 04/01/22  3:40 PM   Specimen: Anterior Nasal Swab  Result Value Ref Range Status   SARS Coronavirus 2 by RT PCR NEGATIVE NEGATIVE Final    Comment: (NOTE) SARS-CoV-2 target nucleic acids are NOT DETECTED.  The SARS-CoV-2 RNA is generally detectable in upper respiratory specimens during the acute phase of infection. The lowest concentration of SARS-CoV-2 viral copies this assay can detect is 138 copies/mL. A negative result does not preclude SARS-Cov-2 infection and should not be used as the sole basis for treatment or other patient management decisions. A negative result may occur with  improper specimen collection/handling, submission of specimen other than nasopharyngeal swab, presence of viral mutation(s) within the areas targeted by this assay, and  inadequate number of viral copies(<138 copies/mL). A negative result must be combined with clinical observations, patient history, and epidemiological information. The expected result is Negative.  Fact Sheet for Patients:  EntrepreneurPulse.com.au  Fact Sheet for Healthcare Providers:  IncredibleEmployment.be  This test is no t yet approved or cleared by the [place] FDA and  has been authorized for detection and/or diagnosis of SARS-CoV-2 by FDA under an Emergency Use Authorization (EUA). This EUA will remain  in effect (meaning this test can be used) for the duration of the COVID-19 declaration under Section 564(b)(1) of the Act, 21 U.S.C.section 360bbb-3(b)(1), unless the authorization is terminated  or revoked sooner.       Influenza A by PCR NEGATIVE NEGATIVE Final   Influenza B by PCR NEGATIVE NEGATIVE Final    Comment: (NOTE) The Xpert Xpress SARS-CoV-2/FLU/RSV plus assay is intended as an aid in the diagnosis of influenza from Nasopharyngeal swab specimens and should not be used as a sole basis for treatment. Nasal washings and aspirates are unacceptable for Xpert Xpress SARS-CoV-2/FLU/RSV testing.  Fact Sheet for Patients: EntrepreneurPulse.com.au  Fact Sheet for Healthcare Providers: IncredibleEmployment.be  This test is not yet approved or cleared by the [place] FDA and has been authorized for detection and/or diagnosis of SARS-CoV-2 by FDA under an Emergency Use Authorization (EUA). This EUA will remain in effect (meaning this test can be used) for the duration of the COVID-19 declaration under Section 564(b)(1) of the Act, 21 U.S.C. section 360bbb-3(b)(1), unless the authorization is terminated or revoked.  Performed at [facility], [place] [address], [place], [place] [address]       Radiology Studies: EEG adult  Result Date: 04-04-2022 Lora Havens, MD      04/04/22  8:16 AM Patient Name: Mike Mack MRN: [id number] Epilepsy Attending: Lora Havens Referring Physician/Provider: Lorenza Chick, MD Date: 04/01/2022 Duration: 22.51 mins Patient history: 86yo M with ams. EG to evaluate for seizure Level of alertness: Awake, drowsy AEDs during EEG study: None Technical aspects: This EEG study was done  with scalp electrodes positioned according to the 10-20 International system of electrode placement. Electrical activity was reviewed with band pass filter of 1-'70Hz'$ , sensitivity of 7 uV/mm, display speed of 82m/sec with a '60Hz'$  notched filter applied as appropriate. EEG data were recorded continuously and digitally stored.  Video monitoring was available and reviewed as appropriate. Description: The posterior dominant rhythm consists of  8 Hz activity of moderate voltage (25-35 uV) seen predominantly in posterior head regions, symmetric and reactive to eye opening and eye closing. Drowsiness was characterized by attenuation of the posterior background rhythm. EEG showed intermittent generalized 3 to 6 Hz theta-delta slowing. Hyperventilation and photic stimulation were not performed.   ABNORMALITY - Intermittent slow, generalized IMPRESSION: This study is suggestive of mild to moderate diffuse encephalopathy, nonspecific etiology. No seizures or epileptiform discharges were seen throughout the recording. PLora Havens  MR BRAIN WO CONTRAST  Result Date: 04/01/2022 CLINICAL DATA:  Neuro deficit, acute, stroke suspected EXAM: MRI HEAD WITHOUT CONTRAST MRA HEAD WITHOUT CONTRAST TECHNIQUE: Multiplanar, multi-echo pulse sequences of the brain and surrounding structures were acquired without intravenous contrast. Angiographic images of the Circle of Willis were acquired using MRA technique without intravenous contrast. COMPARISON:  MRI head November 25, 2020.  MRA head April 10, 2012. FINDINGS: MR HEAD FINDINGS Brain: Punctate focus of DWI hyperintensity in the  right posterior frontal white matter (series 2, image 48) without definite ADC correlate. Similar ventriculomegaly. No evidence of mass lesion, midline shift or acute hemorrhage. Remote infarcts in the cerebellum bilaterally. Scattered T2/FLAIR hyperintensities in the white matter, compatible with chronic microvascular ischemic disease. Vascular: See below. Skull and upper cervical spine: Normal marrow signal. Sinuses/Orbits: No acute findings. Other: Bilateral mastoid effusions. MRA HEAD FINDINGS Significantly motion limited study.  Within this limitation: Anterior circulation: Bilateral intracranial ICAs, proximal MCAs and proximal ACAs are patent. Posterior circulation: Bilateral intradural vertebral arteries, basilar artery and proximal posterior cerebral arteries are patent. IMPRESSION: MRI head: 1. Punctate focus of DWI hyperintensity in the right posterior frontal white matter without definite ADC correlate. This could represent a tiny subacute infarct versus artifact. 2. Similar ventriculomegaly, mildly disc portion it to sulcal enlargement. This could represent central predominant atrophy or normal pressure hydrocephalus. 3. Chronic microvascular ischemic disease and bilateral remote cerebellar infarcts. 4. Bilateral mastoid effusions. MRA: Significantly motion limited study without evidence of proximal large vessel occlusion. Electronically Signed   By: FMargaretha SheffieldM.D.   On: 04/01/2022 14:59   MR ANGIO HEAD WO CONTRAST  Result Date: 04/01/2022 CLINICAL DATA:  Neuro deficit, acute, stroke suspected EXAM: MRI HEAD WITHOUT CONTRAST MRA HEAD WITHOUT CONTRAST TECHNIQUE: Multiplanar, multi-echo pulse sequences of the brain and surrounding structures were acquired without intravenous contrast. Angiographic images of the Circle of Willis were acquired using MRA technique without intravenous contrast. COMPARISON:  MRI head November 25, 2020.  MRA head April 10, 2012. FINDINGS: MR HEAD FINDINGS Brain:  Punctate focus of DWI hyperintensity in the right posterior frontal white matter (series 2, image 48) without definite ADC correlate. Similar ventriculomegaly. No evidence of mass lesion, midline shift or acute hemorrhage. Remote infarcts in the cerebellum bilaterally. Scattered T2/FLAIR hyperintensities in the white matter, compatible with chronic microvascular ischemic disease. Vascular: See below. Skull and upper cervical spine: Normal marrow signal. Sinuses/Orbits: No acute findings. Other: Bilateral mastoid effusions. MRA HEAD FINDINGS Significantly motion limited study.  Within this limitation: Anterior circulation: Bilateral intracranial ICAs, proximal MCAs and proximal ACAs are patent. Posterior circulation: Bilateral intradural vertebral arteries, basilar artery  and proximal posterior cerebral arteries are patent. IMPRESSION: MRI head: 1. Punctate focus of DWI hyperintensity in the right posterior frontal white matter without definite ADC correlate. This could represent a tiny subacute infarct versus artifact. 2. Similar ventriculomegaly, mildly disc portion it to sulcal enlargement. This could represent central predominant atrophy or normal pressure hydrocephalus. 3. Chronic microvascular ischemic disease and bilateral remote cerebellar infarcts. 4. Bilateral mastoid effusions. MRA: Significantly motion limited study without evidence of proximal large vessel occlusion. Electronically Signed   By: Margaretha Sheffield M.D.   On: 04/01/2022 14:59   DG Chest 2 View  Result Date: 04/01/2022 CLINICAL DATA:  Altered mental status EXAM: CHEST - 2 VIEW COMPARISON:  02/28/2022 FINDINGS: Previously seen central line has been removed. Stable heart size. Low lung volumes. Trace bilateral pleural effusions, more evident on the left. No airspace consolidation. No pneumothorax. IMPRESSION: Trace bilateral pleural effusions, more evident on the left. Electronically Signed   By: Davina Poke D.O.   On: 04/01/2022  14:03   CT HEAD CODE STROKE WO CONTRAST  Result Date: 04/01/2022 CLINICAL DATA:  Code stroke. EXAM: CT HEAD WITHOUT CONTRAST TECHNIQUE: Contiguous axial images were obtained from the base of the skull through the vertex without intravenous contrast. RADIATION DOSE REDUCTION: This exam was performed according to the departmental dose-optimization program which includes automated exposure control, adjustment of the mA and/or kV according to patient size and/or use of iterative reconstruction technique. COMPARISON:  03/08/2022 FINDINGS: Brain: No evidence of acute infarction, hemorrhage, cerebral edema, mass, mass effect, or midline shift. No hydrocephalus or extra-axial collection. Diffuse ventricular enlargement, unchanged. Periventricular white matter changes, likely the sequela of chronic small vessel ischemic disease. Vascular: No hyperdense vessel. Skull: Negative for fracture or focal lesion. Sinuses/Orbits: No acute finding. Status post bilateral lens replacements. Other: Fluid in the right-greater-than-left mastoid air cells. ASPECTS [facility] Stroke Program Early CT Score) - Ganglionic level infarction (caudate, lentiform nuclei, internal capsule, insula, M1-M3 cortex): 7 - Supraganglionic infarction (M4-M6 cortex): 3 Total score (0-10 with 10 being normal): 10 IMPRESSION: 1. No acute intracranial process. 2. ASPECTS is 10. Code stroke imaging results were communicated on 04/01/2022 at 1:12 pm to provider BHAGAT via secure text paging. Electronically Signed   By: Merilyn Baba M.D.   On: 04/01/2022 13:12       LOS: 1 day   [facility] Pager on [email or web address]  04/02/2022, 9:23 AM

## 2022-04-03 ENCOUNTER — Inpatient Hospital Stay (HOSPITAL_COMMUNITY): Payer: Medicare HMO

## 2022-04-03 ENCOUNTER — Encounter (HOSPITAL_COMMUNITY): Payer: Medicare HMO

## 2022-04-03 DIAGNOSIS — D649 Anemia, unspecified: Secondary | ICD-10-CM

## 2022-04-03 DIAGNOSIS — R4182 Altered mental status, unspecified: Secondary | ICD-10-CM | POA: Diagnosis not present

## 2022-04-03 DIAGNOSIS — I6389 Other cerebral infarction: Secondary | ICD-10-CM | POA: Diagnosis not present

## 2022-04-03 DIAGNOSIS — N390 Urinary tract infection, site not specified: Secondary | ICD-10-CM | POA: Diagnosis not present

## 2022-04-03 DIAGNOSIS — R4189 Other symptoms and signs involving cognitive functions and awareness: Secondary | ICD-10-CM

## 2022-04-03 DIAGNOSIS — Z7189 Other specified counseling: Secondary | ICD-10-CM | POA: Diagnosis not present

## 2022-04-03 DIAGNOSIS — N179 Acute kidney failure, unspecified: Secondary | ICD-10-CM | POA: Diagnosis not present

## 2022-04-03 DIAGNOSIS — R410 Disorientation, unspecified: Secondary | ICD-10-CM | POA: Diagnosis not present

## 2022-04-03 DIAGNOSIS — Z515 Encounter for palliative care: Secondary | ICD-10-CM | POA: Diagnosis not present

## 2022-04-03 DIAGNOSIS — I639 Cerebral infarction, unspecified: Secondary | ICD-10-CM | POA: Diagnosis not present

## 2022-04-03 LAB — GLUCOSE, CAPILLARY
Glucose-Capillary: 140 mg/dL — ABNORMAL HIGH (ref 70–99)
Glucose-Capillary: 107 mg/dL — ABNORMAL HIGH (ref 70–99)
Glucose-Capillary: 61 mg/dL — ABNORMAL LOW (ref 70–99)
Glucose-Capillary: 133 mg/dL — ABNORMAL HIGH (ref 70–99)
Glucose-Capillary: 99 mg/dL (ref 70–99)

## 2022-04-03 LAB — ECHOCARDIOGRAM COMPLETE
Area-P 1/2: 3.31 cm2
Calc EF: 66.1 %
S' Lateral: 2.4 cm
Single Plane A2C EF: 61.7 %
Single Plane A4C EF: 73.7 %

## 2022-04-03 LAB — CBC
HCT: 26.8 % — ABNORMAL LOW (ref 39.0–52.0)
Hemoglobin: 8.3 g/dL — ABNORMAL LOW (ref 13.0–17.0)
MCH: 30.9 pg (ref 26.0–34.0)
MCHC: 31 g/dL (ref 30.0–36.0)
MCV: 99.6 fL (ref 80.0–100.0)
Platelets: 255 10*3/uL (ref 150–400)
RBC: 2.69 MIL/uL — ABNORMAL LOW (ref 4.22–5.81)
RDW: 16.7 % — ABNORMAL HIGH (ref 11.5–15.5)
WBC: 6.2 10*3/uL (ref 4.0–10.5)
nRBC: 0 % (ref 0.0–0.2)

## 2022-04-03 LAB — BASIC METABOLIC PANEL
Anion gap: 8 (ref 5–15)
BUN: 24 mg/dL — ABNORMAL HIGH (ref 8–23)
CO2: 22 mmol/L (ref 22–32)
Calcium: 8.1 mg/dL — ABNORMAL LOW (ref 8.9–10.3)
Chloride: 111 mmol/L (ref 98–111)
Creatinine, Ser: 1.32 mg/dL — ABNORMAL HIGH (ref 0.61–1.24)
GFR, Estimated: 54 mL/min — ABNORMAL LOW (ref >60)
Glucose, Bld: 48 mg/dL — ABNORMAL LOW (ref 70–99)
Potassium: 3.2 mmol/L — ABNORMAL LOW (ref 3.5–5.1)
Sodium: 141 mmol/L (ref 135–145)

## 2022-04-03 MED ORDER — SODIUM CHLORIDE 0.9 % IV SOLN: INTRAVENOUS | Status: DC

## 2022-04-03 MED ORDER — POTASSIUM CHLORIDE 10 MEQ/100ML IV SOLN
10.0000 meq | INTRAVENOUS | Status: AC
  Administered 2022-04-03 (×3): 10 meq via INTRAVENOUS
  Filled 2022-04-03 (×3): qty 100

## 2022-04-03 MED ORDER — PANTOPRAZOLE SODIUM 40 MG PO TBEC
40.0000 mg | DELAYED_RELEASE_TABLET | Freq: Two times a day (BID) | ORAL | Status: DC
  Administered 2022-04-03 – 2022-04-05 (×4): 40 mg via ORAL
  Filled 2022-04-03 (×4): qty 1

## 2022-04-03 MED ORDER — DEXTROSE 50 % IV SOLN
1.0000 | INTRAVENOUS | Status: DC | PRN
  Administered 2022-04-04: 50 mL via INTRAVENOUS
  Filled 2022-04-03: qty 50

## 2022-04-03 MED ORDER — DEXTROSE 50 % IV SOLN
1.0000 | Freq: Once | INTRAVENOUS | Status: AC
  Administered 2022-04-03: 50 mL via INTRAVENOUS
  Filled 2022-04-03: qty 50

## 2022-04-03 MED ORDER — ENSURE ENLIVE PO LIQD
237.0000 mL | Freq: Three times a day (TID) | ORAL | Status: DC
  Administered 2022-04-03 – 2022-04-05 (×5): 237 mL via ORAL

## 2022-04-03 NOTE — Progress Notes (Signed)
Speech Language Pathology Treatment: Dysphagia  Patient Details Name: Mike Mack MRN: [id number] DOB: 1935-12-24 Today's Date: 04/03/2022 Time: 1102-1117 SLP Time Calculation (min) (ACUTE ONLY): 12 min  Assessment / Plan / Recommendation Clinical Impression  Pt was seen for dysphagia treatment. Pt's nurse reported oral holding with crushed meds, but denied any signs of aspiration with solids or liquids. Pt was alert and cooperative, but this waned during the session. Pt tolerated puree, dysphagia 3 solids, dual consistency boluses (i.e., pears with liquids), and thin liquids via straw using individual and consecutive swallows without overt s/s of aspiration. Mastication was prolonged with dysphagia 3 solids and mild oral holding was noted with purees, but no significant oral residue was observed when pt's alertness was optimized. It is noteworthy though that a piece of pear was noted in the pt's right lateral sulcus towards the end of the session when pt was more lethargic. A dysphagia 2 diet with thin liquids is recommended at this time and SLP will continue to follow pt.     HPI HPI: 86 y/o male admitted with AMS, found slumped over in chair, not talking by spouse. MRI shows possible tiny subacute infarct in the posterior frontal white matter versus artifact. PMH: stroke, memory loss, possible parkinsonism, CKD stage 3, hypertension, diabetes, polio, right tibial fracture. Pt seen by SLP 03/01/22-03/07/22 when pt was admitted for UTI and was able to be progressed to dysphagia 3 and thin liquids.      SLP Plan  Continue with current plan of care      Recommendations for follow up therapy are one component of a multi-disciplinary discharge planning process, led by the attending physician.  Recommendations may be updated based on patient status, additional functional criteria and insurance authorization.    Recommendations  Diet recommendations: Dysphagia 2 (fine chop);Thin liquid Liquids  provided via: Cup;Straw Medication Administration: Whole meds with puree (or crushed; as tolerated) Supervision: Staff to assist with self feeding;Full supervision/cueing for compensatory strategies Compensations: Slow rate;Small sips/bites Postural Changes and/or Swallow Maneuvers: Seated upright 90 degrees                Oral Care Recommendations: Oral care BID Follow Up Recommendations: Skilled nursing-short term rehab (<3 hours/day) Assistance recommended at discharge: Frequent or constant Supervision/Assistance SLP Visit Diagnosis: Dysphagia, oral phase (R13.11) Plan: Continue with current plan of care          [name] I. Hardin Negus, [place], [place] Office number [phone]  Horton Marshall  04/03/2022, 9:59 AM

## 2022-04-03 NOTE — Progress Notes (Addendum)
STROKE TEAM PROGRESS NOTE   INTERVAL HISTORY Patient's wife is at bedside. Patient was fairly sedated this morning. He was able to answer some questions like what city he is in but he continues to say that he is at home. Wife says patient sleeps a fair amount during the day at his baseline. He is wheelchair bound at home.  Vitals:   04/02/22 2341 04/03/22 0310 04/03/22 0814 04/03/22 1148  BP: (!) 136/50 (!) 160/67 (!) 164/96 134/75  Pulse: 72 92 89 68  Resp: '17 16 20 17  '$ Temp: 97.9 F (36.6 C) 98.2 F (36.8 C) 97.7 F (36.5 C) 97.8 F (36.6 C)  TempSrc: Oral Oral Axillary Axillary  SpO2: (!) 81% 100% 100% 100%   CBC:  Recent Labs  Lab 04/01/22 1250 04/01/22 1254 04/03/22 0428  WBC 10.9*  --  6.2  NEUTROABS 9.1*  --   --   HGB 10.6* 12.2* 8.3*  HCT 34.0* 36.0* 26.8*  MCV 100.3*  --  99.6  PLT 340  --  803   Basic Metabolic Panel:  Recent Labs  Lab 04/01/22 1250 04/01/22 1254 04/01/22 2145 04/03/22 0428  NA 137 140  --  141  K 4.3 4.6  --  3.2*  CL 106 105  --  111  CO2 22  --   --  22  GLUCOSE 189* 188*  --  48*  BUN 30* 35*  --  24*  CREATININE 1.99* 1.90*  --  1.32*  CALCIUM 8.7*  --   --  8.1*  MG  --   --  2.0  --    Lipid Panel:  Recent Labs  Lab 04/01/22 2145  CHOL 94  TRIG 59  HDL 44  CHOLHDL 2.1  VLDL 12  LDLCALC 38   HgbA1c:  Recent Labs  Lab 04/01/22 1250  HGBA1C 5.5   Urine Drug Screen:  Recent Labs  Lab 04/01/22 1639  LABOPIA NONE DETECTED  COCAINSCRNUR NONE DETECTED  LABBENZ NONE DETECTED  AMPHETMU NONE DETECTED  THCU NONE DETECTED  LABBARB NONE DETECTED    Alcohol Level  Recent Labs  Lab 04/01/22 1250  ETH <10    IMAGING past 24 hours No results found.  PHYSICAL EXAM General: Pleasant, sedated in bed. No acute distress. Skin: Warm and dry. No obvious rash or lesions. Neuro: A&Ox2 knew his name and the city. Withdraws extremities to pain stimulation. Cranial nerve exam, cerebellar abnormalities, strength, and  sensation evaluation limited as patient was sedated this morning. No dysarthria. Psych: Normal mood and affect  ASSESSMENT/PLAN Mike Mack is a 86 y.o. male with medical history significant of T2DM, HTN, hx of CVA, memory loss, CKD stage 3, polio  Who presented to ED with AMS.  Possible stroke, incidental finding:  tiny subacute infarct versus artifact in the right posterior frontal white matter, likely small vessel disease  Code Stroke CT head No acute abnormality. ASPECTS 10.    MRI  Punctate focus of DWI hyperintensity in the right posterior frontal white matter without definite ADC correlate. This could represent a tiny subacute infarct versus artifact. Chronic microvascular ischemic disease and bilateral remote cerebellar infarcts. MRA head Significantly motion limited study without evidence of proximal large vessel occlusion. VAS [place] pending, pt not cooperative 2D Echo EF 60 to 65% LDL 38 HgbA1c 5.5 VTE prophylaxis - Lovenox No antithrombotic prior to admission, now on ASA '81mg'$ , no DAPT due to GI bleed history one month ago. Therapy recommendations:  home health PT/OT Disposition:  home  Encephalopathy Likely 2/2 UTI, urosepsis, AKI, hypothermia, hypotension.  EEG was negative for seizures and suggestive of mild to moderate diffuse encephalopathy Manage per primary  UTI Urosepsis Presented with hypotension and hypothermia UA WBC > 50 Urine culture pending Leukocytosis WBC 10.9->6.2 History of urosepsis, septic shock and AKI in 02/2022  AKI -Creatinine 1.90>1.32 -Baseline creatinine normal -On IV fluid -Continue to monitor   History of stroke 03/2012 admitted for right-sided weakness and numbness.  MRI showed left thalamic infarct.  LDL 93, A1c 7.9, EF 65 to 70%, MRI and carotid Doppler unremarkable.  Discharged on aspirin.  Hypotension Hx of Hypertension Home meds:  norvasc, lopressor, benicar Low BP on presentation 90s Now BP stable, on IVF Long-term BP goal  normotensive  Hyperlipidemia Home meds:  lipitor 20, restarted in hospital LDL 38, goal < 70 Continue statin at discharge  Diabetes type II Uncontrolled Home meds:  insulin 14 units HgbA1c 7.5, goal < 7.0 CBGs SSI Close PCP follow-up for better DM control  Other Stroke Risk Factors Advanced Age >/= 30   Other Active Problems  Dementia (HCC) -Difficult to say if his AMS is near baseline. -ex vacuo vs. NPH on serial CT head -Delirium precautions -Continue namenda and remeron   Hypothermia Infectious vs. Age/inactivity and low body weight -Cultures pending, continue rocephin -Room warm with blankets and temp improving.  -Check TSH    recent GI bleed in 02/2022 -S/p EGD which showed gastric and duodenal ulcers and erosion without active bleeding. S/p 1 unit RBC -ASA was held PTA -Has not been taking his PPI '40mg'$  BID x 6 weeks -Starting IV while NPO -resume ASA 81    Right tibial fracture Right LE polio  Fell in August and broke leg Non weight bearing Has not followed up with ortho as he keeps ending up back in hospital  He was bedbound before he fell    Hospital day # 2  Stormy Fabian, MD PGY-1 Psychiatry  ATTENDING NOTE: I reviewed above note and agree with the assessment and plan. Pt was seen and examined.   Wife at bedside.  Per RN, patient awake alert this morning, working with PT/OT.  However, on rounds, patient drowsy sleepy, able to briefly open eyes on voice but not able to cooperate with exam.  Not able to answer orientation questions, combative with bilateral upper extremities, strong symmetrical.  Left lower extremity withdraw to pain, right lower extremity no movement due to chronic polio.  AKI improved, creatinine improving, leukocytosis resolved.  Continue on antibiotics.  On aspirin and statin.  We will follow.  For detailed assessment and plan, please refer to above/below as I have made changes wherever appropriate.   Rosalin Hawking, MD PhD Stroke  Neurology 04/03/2022 7:23 PM     To contact Stroke Continuity provider, please refer to [email or web address] After hours, contact General Neurology

## 2022-04-03 NOTE — Consult Note (Signed)
[facility] Nurse Consult Note: Reason for Consult:Patient with multiple comorbid conditions admitted today with a small Stage 3 pressure injury noted on admission by Dr. Rogers Blocker. Patient was discharged from [facility] 5 days ago. Wound type:Pressure Pressure Injury POA: Yes Measurement:To be obtained by bedside RN with next dressing change today and documented on Nursing Flow Sheet Wound bed: red Drainage (amount, consistency, odor) scant serous Periwound:intact Dressing procedure/placement/frequency:Patient will be turned and repositioned from side to side while in bed and time in the supine position minimized. Topical care to the wound will be with a NS cleanse followed by placement of a xeroform (antimicrobial nonadherent) gauze as a wound contact layer. This will be topped with a dry gauze 2x2 and secured with a silicone foam bordered dressing placed so that the "tip" is pointing away from the anus. A pressure redistribution chair cushion is provided for OOB use.Bilateral Prevalon pressure redistribution heel boots are to be used while in bed for heel PI prevention.  [facility] nursing team will not follow, but will remain available to this patient, the nursing and medical teams.  Please re-consult if needed.  Thank you for inviting [place] to participate in this patient's Plan of Care.  Maudie Flakes, MSN, RN, CNS, [place], Serita Grammes, [employer], [employer] phone:  [phone]

## 2022-04-03 NOTE — TOC CAGE-AID Note (Signed)
Transition of Care [facility]) - CAGE-AID Screening   Patient Details  Name: Breylen Agyeman MRN: [id number] Date of Birth: Mar 14, 1936  Transition of Care [facility]) CM/SW Contact:    Coralee Pesa, [place] Phone Number: 04/03/2022, 9:08 AM   Clinical Narrative: Per chart review, pt is disoriented and not appropriate to participate in CAGE- AID assessment.   CAGE-AID Screening: Substance Abuse Screening unable to be completed due to: : Patient unable to participate             Substance Abuse Education Offered: No

## 2022-04-03 NOTE — Progress Notes (Addendum)
[facility] PROGRESS NOTE   Mike Mack MBW:[id number] DOB: 16-Dec-1935 DOA: 04/01/2022  PCP: [name], MD  Brief History/Interval Summary: 86 y.o. male with medical history significant of T2DM, HTN, hx of CVA, memory loss, CKD stage 3, polio who presented to ED with AMS. His wife gave him his breakfast at 6:30am and she states he was his normal self. At 8am she got him up and dressed and in his WC.  She went to go check on him around 11-11:30 and he was slumped over and not talking to her. Ems was called. At baseline he carries on a normal conversation.  He knows family members, doesn't know date and sometimes confused as to where he is. Needs help with all ADLs. He does feed himself.  Patient was found to have a small stroke in the right hemisphere.  Neurology was consulted.  Patient was hospitalized for further management. Just admitted to [facility] from 9/12-9/21/23 for septic shock secondary to UTI and required stay in ICU on pressors. Also had acute on chronic renal failure and GI bleeding. EGD showed gastric and duodenal ulcers and erosion without active bleeding. S/p 1 unit PRBC.    Consultants: Neurology.  Palliative care  Procedures: EEG    Subjective/Interval History: Patient remains confused and distracted though answering questions better today than yesterday.  Denies any pain.     Assessment/Plan:  Acute stroke/acute metabolic encephalopathy Apparently he was unresponsive and slumped over on his wheelchair at home.  MRI shows possible tiny subacute infarct in the posterior frontal white matter versus artifact.  Neurology is following.  Started on aspirin. EEG does not show any seizure activity.   Echocardiogram is pending.  Carotid Doppler study is pending. LDL is 38.  HbA1c 5.5.   At baseline he needs help with all ADLs.  Seen by PT and OT.  Home health recommended. Patient has significant cognitive dysfunction.  He has a diagnosis of dementia. Seen by  palliative care.  They have discussed with his wife.  DNR is confirmed.  No de-escalation of care at this time but no aggressive measures either.    Acute kidney injury Presented with creatinine of 1.99.  His creatinine was 1.12 back in September.   Started on IV fluids.  Improvement in renal function noted today.  Monitor urine output.    Essential hypertension Amlodipine and metoprolol and olmesartan on hold.  Blood pressure was low yesterday.  Noted to have improved. Allowing permissive hypertension.  Urinary tract infection Recent admission for UTI and septic shock noted.  UA is noted to be abnormal.   Started on ceftriaxone.  Follow-up on urine cultures.  Interestingly no organism was identified in his urine back in September when he was hospitalized for septic shock from presumed UTI.  Recent GI bleed in September 2023 He status post EGD which showed gastric and duodenal ulcers without any active bleeding.  He required 1 unit of transfusion during this admission.  Aspirin was held at that time.  He was asked to take PPI twice a day but looks like he had not been taking it.  PPI initiated here.   Started on aspirin due to acute stroke.  No evidence for bleeding at this time.  Drop in hemoglobin is likely dilutional.  Normocytic anemia Drop in hemoglobin is likely dilutional.  Old labs reviewed.  Looks like his baseline hemoglobin is between 7 and 8.  No evidence for overt bleeding.  Continue to monitor.  Hypokalemia Will replete potassium.  Magnesium was 2.0 on 10/14  Right tibial fracture He fell in August and broke his leg.  Nonweightbearing.  Has not followed up with orthopedics as he has been hospitalized 2 times now.  He was bedbound even before he fell.  He was supposed to see Dr. Stann Mainland with orthopedics.  This will need to be addressed at discharge.  Dementia At home he is on Namenda and Remeron.  Delirium precautions.  Diabetes mellitus type 2 Recent HbA1c was 7.5.   Continue SSI.  History of glaucoma Continue with eyedrops.  Hypothyroidism Continue levothyroxine.  BPH Continue Flomax.  Hypothermia, resolved Temperature was 96.2 F at initial assessment.  Subsequent temperatures have been normal.  Continue to monitor.  DVT Prophylaxis: SCDs Code Status: DNR Family Communication: No family at bedside.  Call his wife later today Disposition Plan: Anticipate that he will go back home with home health.  Status is: Inpatient Remains inpatient appropriate because: Acute stroke      Medications: Scheduled:   stroke: early stages of recovery book   Does not apply Once   aspirin EC  81 mg Oral Daily   atorvastatin  20 mg Oral QPM   brimonidine  1 drop Both Eyes TID   dorzolamide  1 drop Both Eyes BID   And   timolol  1 drop Both Eyes BID   enoxaparin (LOVENOX) injection  40 mg Subcutaneous Q24H   latanoprost  1 drop Both Eyes QHS   levothyroxine  88 mcg Oral QAC breakfast   memantine  10 mg Oral BID   mirtazapine  15 mg Oral QHS   Netarsudil Dimesylate  1 drop Both Eyes QHS   pantoprazole (PROTONIX) IV  40 mg Intravenous Q12H   tamsulosin  0.4 mg Oral QPC supper   Continuous:  sodium chloride 75 mL/hr at 04/02/22 2243   cefTRIAXone (ROCEPHIN)  IV Stopped (04/02/22 1900)   HQI:[id number] **OR** acetaminophen (TYLENOL) oral liquid 160 mg/5 mL **OR** acetaminophen, senna-docusate  Antibiotics: Anti-infectives (From admission, onward)    Start     Dose/Rate Route Frequency Ordered Stop   04/01/22 1745  cefTRIAXone (ROCEPHIN) 2 g in sodium chloride 0.9 % 100 mL IVPB        2 g 200 mL/hr over 30 Minutes Intravenous Every 24 hours 04/01/22 1744     04/01/22 1530  cefTRIAXone (ROCEPHIN) 2 g in sodium chloride 0.9 % 100 mL IVPB  Status:  Discontinued        2 g 200 mL/hr over 30 Minutes Intravenous Every 24 hours 04/01/22 1520 04/01/22 1743       Objective:  Vital Signs  Vitals:   04/02/22 2206 04/02/22 2341 04/03/22 0310  04/03/22 0814  BP: 134/76 (!) 136/50 (!) 160/67 (!) 164/96  Pulse: (!) 101 72 92 89  Resp: '17 17 16 20  '$ Temp: 98.4 F (36.9 C) 97.9 F (36.6 C) 98.2 F (36.8 C) 97.7 F (36.5 C)  TempSrc: Oral Oral Oral Oral  SpO2:  (!) 81% 100% 100%    Intake/Output Summary (Last 24 hours) at 04/03/2022 1114 Last data filed at 04/03/2022 0300 Gross per 24 hour  Intake --  Output 300 ml  Net -300 ml   There were no vitals filed for this visit.  General appearance: Awake alert.  In no distress.  Distracted Resp: Clear to auscultation bilaterally.  Normal effort Cardio: S1-S2 is normal regular.  No S3-S4.  No rubs murmurs or bruit GI: Abdomen is soft.  Nontender nondistended.  Bowel sounds  are present normal.  No masses organomegaly   Lab Results:  Data Reviewed: I have personally reviewed following labs and reports of the imaging studies  CBC: Recent Labs  Lab 04/01/22 1250 04/01/22 1254 04/03/22 0428  WBC 10.9*  --  6.2  NEUTROABS 9.1*  --   --   HGB 10.6* 12.2* 8.3*  HCT 34.0* 36.0* 26.8*  MCV 100.3*  --  99.6  PLT 340  --  255     Basic Metabolic Panel: Recent Labs  Lab 04/01/22 1250 04/01/22 1254 04/01/22 2145 04/03/22 0428  NA 137 140  --  141  K 4.3 4.6  --  3.2*  CL 106 105  --  111  CO2 22  --   --  22  GLUCOSE 189* 188*  --  48*  BUN 30* 35*  --  24*  CREATININE 1.99* 1.90*  --  1.32*  CALCIUM 8.7*  --   --  8.1*  MG  --   --  2.0  --      GFR: CrCl cannot be calculated (Unknown ideal weight.).  Liver Function Tests: Recent Labs  Lab 04/01/22 1250  AST 24  ALT 14  ALKPHOS 124  BILITOT 0.8  PROT 7.0  ALBUMIN 2.7*     Recent Labs  Lab 04/01/22 1250  AMMONIA 20     Coagulation Profile: Recent Labs  Lab 04/01/22 1250  INR 1.3*      CBG: Recent Labs  Lab 04/01/22 1305 04/03/22 0812  GLUCAP 150* 140*      Anemia Panel: Recent Labs    04/01/22 2145  VITAMINB12 1,169*     Recent Results (from the past 240 hour(s))   Culture, blood (routine x 2)     Status: None (Preliminary result)   Collection Time: 04/01/22 12:50 PM   Specimen: BLOOD  Result Value Ref Range Status   Specimen Description BLOOD LEFT ANTECUBITAL  Final   Special Requests   Final    BOTTLES DRAWN AEROBIC AND ANAEROBIC Blood Culture adequate volume   Culture   Final    NO GROWTH 2 DAYS Performed at [facility], [place] [address] [place], [place] [address]    Report Status PENDING  Incomplete  Resp Panel by RT-PCR (Flu A&B, Covid) Anterior Nasal Swab     Status: None   Collection Time: 04/01/22  3:40 PM   Specimen: Anterior Nasal Swab  Result Value Ref Range Status   SARS Coronavirus 2 by RT PCR NEGATIVE NEGATIVE Final    Comment: (NOTE) SARS-CoV-2 target nucleic acids are NOT DETECTED.  The SARS-CoV-2 RNA is generally detectable in upper respiratory specimens during the acute phase of infection. The lowest concentration of SARS-CoV-2 viral copies this assay can detect is 138 copies/mL. A negative result does not preclude SARS-Cov-2 infection and should not be used as the sole basis for treatment or other patient management decisions. A negative result may occur with  improper specimen collection/handling, submission of specimen other than nasopharyngeal swab, presence of viral mutation(s) within the areas targeted by this assay, and inadequate number of viral copies(<138 copies/mL). A negative result must be combined with clinical observations, patient history, and epidemiological information. The expected result is Negative.  Fact Sheet for Patients:  EntrepreneurPulse.com.au  Fact Sheet for Healthcare Providers:  IncredibleEmployment.be  This test is no t yet approved or cleared by the [place] FDA and  has been authorized for detection and/or diagnosis of SARS-CoV-2 by FDA under an Emergency  Use Authorization (EUA). This EUA will remain  in effect (meaning this test can be  used) for the duration of the COVID-19 declaration under Section 564(b)(1) of the Act, 21 U.S.C.section 360bbb-3(b)(1), unless the authorization is terminated  or revoked sooner.       Influenza A by PCR NEGATIVE NEGATIVE Final   Influenza B by PCR NEGATIVE NEGATIVE Final    Comment: (NOTE) The Xpert Xpress SARS-CoV-2/FLU/RSV plus assay is intended as an aid in the diagnosis of influenza from Nasopharyngeal swab specimens and should not be used as a sole basis for treatment. Nasal washings and aspirates are unacceptable for Xpert Xpress SARS-CoV-2/FLU/RSV testing.  Fact Sheet for Patients: EntrepreneurPulse.com.au  Fact Sheet for Healthcare Providers: IncredibleEmployment.be  This test is not yet approved or cleared by the [place] FDA and has been authorized for detection and/or diagnosis of SARS-CoV-2 by FDA under an Emergency Use Authorization (EUA). This EUA will remain in effect (meaning this test can be used) for the duration of the COVID-19 declaration under Section 564(b)(1) of the Act, 21 U.S.C. section 360bbb-3(b)(1), unless the authorization is terminated or revoked.  Performed at [facility], [place] [address], [place], [place] [address]   Culture, blood (routine x 2)     Status: None (Preliminary result)   Collection Time: 04/01/22  9:48 PM   Specimen: BLOOD  Result Value Ref Range Status   Specimen Description BLOOD SITE NOT SPECIFIED  Final   Special Requests   Final    BOTTLES DRAWN AEROBIC ONLY Blood Culture results may not be optimal due to an inadequate volume of blood received in culture bottles   Culture   Final    NO GROWTH 2 DAYS Performed at [facility], [place] [address], [place],  [address]    Report Status PENDING  Incomplete      Radiology Studies: EEG adult  Result Date: 04-17-22 Lora Havens, MD     17-Apr-2022  8:16 AM Patient Name: Mike Mack MRN: [id number] Epilepsy  Attending: Lora Havens Referring Physician/Provider: Lorenza Chick, MD Date: 04/01/2022 Duration: 22.51 mins Patient history: 86yo M with ams. EG to evaluate for seizure Level of alertness: Awake, drowsy AEDs during EEG study: None Technical aspects: This EEG study was done with scalp electrodes positioned according to the 10-20 International system of electrode placement. Electrical activity was reviewed with band pass filter of 1-'70Hz'$ , sensitivity of 7 uV/mm, display speed of 65m/sec with a '60Hz'$  notched filter applied as appropriate. EEG data were recorded continuously and digitally stored.  Video monitoring was available and reviewed as appropriate. Description: The posterior dominant rhythm consists of  8 Hz activity of moderate voltage (25-35 uV) seen predominantly in posterior head regions, symmetric and reactive to eye opening and eye closing. Drowsiness was characterized by attenuation of the posterior background rhythm. EEG showed intermittent generalized 3 to 6 Hz theta-delta slowing. Hyperventilation and photic stimulation were not performed.   ABNORMALITY - Intermittent slow, generalized IMPRESSION: This study is suggestive of mild to moderate diffuse encephalopathy, nonspecific etiology. No seizures or epileptiform discharges were seen throughout the recording. PLora Havens  MR BRAIN WO CONTRAST  Result Date: 04/01/2022 CLINICAL DATA:  Neuro deficit, acute, stroke suspected EXAM: MRI HEAD WITHOUT CONTRAST MRA HEAD WITHOUT CONTRAST TECHNIQUE: Multiplanar, multi-echo pulse sequences of the brain and surrounding structures were acquired without intravenous contrast. Angiographic images of the Circle of Willis were acquired using MRA technique without intravenous contrast. COMPARISON:  MRI head  November 25, 2020.  MRA head April 10, 2012. FINDINGS: MR HEAD FINDINGS Brain: Punctate focus of DWI hyperintensity in the right posterior frontal white matter (series 2, image 48) without definite  ADC correlate. Similar ventriculomegaly. No evidence of mass lesion, midline shift or acute hemorrhage. Remote infarcts in the cerebellum bilaterally. Scattered T2/FLAIR hyperintensities in the white matter, compatible with chronic microvascular ischemic disease. Vascular: See below. Skull and upper cervical spine: Normal marrow signal. Sinuses/Orbits: No acute findings. Other: Bilateral mastoid effusions. MRA HEAD FINDINGS Significantly motion limited study.  Within this limitation: Anterior circulation: Bilateral intracranial ICAs, proximal MCAs and proximal ACAs are patent. Posterior circulation: Bilateral intradural vertebral arteries, basilar artery and proximal posterior cerebral arteries are patent. IMPRESSION: MRI head: 1. Punctate focus of DWI hyperintensity in the right posterior frontal white matter without definite ADC correlate. This could represent a tiny subacute infarct versus artifact. 2. Similar ventriculomegaly, mildly disc portion it to sulcal enlargement. This could represent central predominant atrophy or normal pressure hydrocephalus. 3. Chronic microvascular ischemic disease and bilateral remote cerebellar infarcts. 4. Bilateral mastoid effusions. MRA: Significantly motion limited study without evidence of proximal large vessel occlusion. Electronically Signed   By: Margaretha Sheffield M.D.   On: 04/01/2022 14:59   MR ANGIO HEAD WO CONTRAST  Result Date: 04/01/2022 CLINICAL DATA:  Neuro deficit, acute, stroke suspected EXAM: MRI HEAD WITHOUT CONTRAST MRA HEAD WITHOUT CONTRAST TECHNIQUE: Multiplanar, multi-echo pulse sequences of the brain and surrounding structures were acquired without intravenous contrast. Angiographic images of the Circle of Willis were acquired using MRA technique without intravenous contrast. COMPARISON:  MRI head November 25, 2020.  MRA head April 10, 2012. FINDINGS: MR HEAD FINDINGS Brain: Punctate focus of DWI hyperintensity in the right posterior frontal white matter  (series 2, image 48) without definite ADC correlate. Similar ventriculomegaly. No evidence of mass lesion, midline shift or acute hemorrhage. Remote infarcts in the cerebellum bilaterally. Scattered T2/FLAIR hyperintensities in the white matter, compatible with chronic microvascular ischemic disease. Vascular: See below. Skull and upper cervical spine: Normal marrow signal. Sinuses/Orbits: No acute findings. Other: Bilateral mastoid effusions. MRA HEAD FINDINGS Significantly motion limited study.  Within this limitation: Anterior circulation: Bilateral intracranial ICAs, proximal MCAs and proximal ACAs are patent. Posterior circulation: Bilateral intradural vertebral arteries, basilar artery and proximal posterior cerebral arteries are patent. IMPRESSION: MRI head: 1. Punctate focus of DWI hyperintensity in the right posterior frontal white matter without definite ADC correlate. This could represent a tiny subacute infarct versus artifact. 2. Similar ventriculomegaly, mildly disc portion it to sulcal enlargement. This could represent central predominant atrophy or normal pressure hydrocephalus. 3. Chronic microvascular ischemic disease and bilateral remote cerebellar infarcts. 4. Bilateral mastoid effusions. MRA: Significantly motion limited study without evidence of proximal large vessel occlusion. Electronically Signed   By: Margaretha Sheffield M.D.   On: 04/01/2022 14:59   DG Chest 2 View  Result Date: 04/01/2022 CLINICAL DATA:  Altered mental status EXAM: CHEST - 2 VIEW COMPARISON:  02/28/2022 FINDINGS: Previously seen central line has been removed. Stable heart size. Low lung volumes. Trace bilateral pleural effusions, more evident on the left. No airspace consolidation. No pneumothorax. IMPRESSION: Trace bilateral pleural effusions, more evident on the left. Electronically Signed   By: Davina Poke D.O.   On: 04/01/2022 14:03   CT HEAD CODE STROKE WO CONTRAST  Result Date: 04/01/2022 CLINICAL DATA:   Code stroke. EXAM: CT HEAD WITHOUT CONTRAST TECHNIQUE: Contiguous axial images were obtained from the base of the skull through the  vertex without intravenous contrast. RADIATION DOSE REDUCTION: This exam was performed according to the departmental dose-optimization program which includes automated exposure control, adjustment of the mA and/or kV according to patient size and/or use of iterative reconstruction technique. COMPARISON:  03/08/2022 FINDINGS: Brain: No evidence of acute infarction, hemorrhage, cerebral edema, mass, mass effect, or midline shift. No hydrocephalus or extra-axial collection. Diffuse ventricular enlargement, unchanged. Periventricular white matter changes, likely the sequela of chronic small vessel ischemic disease. Vascular: No hyperdense vessel. Skull: Negative for fracture or focal lesion. Sinuses/Orbits: No acute finding. Status post bilateral lens replacements. Other: Fluid in the right-greater-than-left mastoid air cells. ASPECTS [facility] Stroke Program Early CT Score) - Ganglionic level infarction (caudate, lentiform nuclei, internal capsule, insula, M1-M3 cortex): 7 - Supraganglionic infarction (M4-M6 cortex): 3 Total score (0-10 with 10 being normal): 10 IMPRESSION: 1. No acute intracranial process. 2. ASPECTS is 10. Code stroke imaging results were communicated on 04/01/2022 at 1:12 pm to provider BHAGAT via secure text paging. Electronically Signed   By: Merilyn Baba M.D.   On: 04/01/2022 13:12       LOS: 2 days   [facility] Pager on [email or web address]  04/03/2022, 11:14 AM

## 2022-04-03 NOTE — Evaluation (Signed)
Occupational Therapy Evaluation Patient Details Name: Mike Mack MRN: [id number] DOB: [birth year] Today's Date: 04/03/2022   History of Present Illness Pt is a 86 y/o male presenting on 10/14 after slupping over in [facility]. Found with small CVA in R hemisphere (posterior frontal white matter). EEG negative for seizures and suggestive of moderate diffuse encephalopathy.  PMH includes: stroke, memory loss, possible parkinsonism, CKD stage 3, HTN, DM, polio, R tibial fx.   Clinical Impression   Patient admitted for above and presents with problem list below, including generalized weakness, decreased activity tolerance, impaired balance.  Pt denies visual deficits, but anticipate poor vision at baseline as unable to look directly at therapist, reach for cup of juice in front of him, and has preference to keep his eyes closed. Pt with dementia at baseline, but following simple commands with increased time.  Patient requires +2 max assist for bed mobility, max-total assist for ADLs.  Anticipate this is baseline.  Based on performance today, will defer OT services to [facility] to decrease burden of care and optimize participation in ADLs.      Recommendations for follow up therapy are one component of a multi-disciplinary discharge planning process, led by the attending physician.  Recommendations may be updated based on patient status, additional functional criteria and insurance authorization.   Follow Up Recommendations  Home health OT    Assistance Recommended at Discharge Frequent or constant Supervision/Assistance  Patient can return home with the following Two people to help with walking and/or transfers;Two people to help with bathing/dressing/bathroom;Direct supervision/assist for medications management;Assistance with feeding;Assistance with cooking/housework;Direct supervision/assist for financial management;Assist for transportation;Help with stairs or ramp for entrance    Functional Status  Assessment     Equipment Recommendations  None recommended by OT    Recommendations for Other Services       Precautions / Restrictions Precautions Precautions: Fall Precaution Comments: R KI, h/o R tibial plataeu fx - non-op Required Braces or Orthoses: Knee Immobilizer - Right Knee Immobilizer - Right: On at all times Restrictions Weight Bearing Restrictions: Yes RLE Weight Bearing: Non weight bearing (suspect NWB with the noted fracture)      Mobility Bed Mobility Overal bed mobility: Needs Assistance Bed Mobility: Rolling, Sidelying to Sit Rolling: Mod assist, +2 for physical assistance Sidelying to sit: Max assist, +2 for physical assistance       General bed mobility comments: max directional verbal cues, hand over hand with L UE to use bed rail to help roll, maxA for trunk elevation and to bring LEs off EOB    Transfers Overall transfer level: Needs assistance Equipment used: 2 person hand held assist Transfers: Sit to/from Stand Sit to Stand: Max assist, +2 physical assistance           General transfer comment: attempted to stand x 2, unable to achieve full upright standing, maxA to maintain R LE NWB, pt did initiate transfer via pushing up from bed      Balance Overall balance assessment: Needs assistance Sitting-balance support: Feet supported, No upper extremity supported Sitting balance-Leahy Scale: Poor Sitting balance - Comments: pt initially with strong R lateral lean, with max verbal and tactile cues pt able to maintain midline with close min guard and intermittent use of bilat UE for support Postural control: Right lateral lean                                 ADL either performed  or assessed with clinical judgement   ADL Overall ADL's : At baseline Eating/Feeding: Maximal assistance;Sitting Eating/Feeding Details (indicate cue type and reason): to drink juice Grooming: Minimal assistance;Wash/dry face;Bed level Grooming Details  (indicate cue type and reason): would need more assist for bimanual tasks         Upper Body Dressing : Total assistance;Sitting   Lower Body Dressing: Total assistance;Sit to/from Health and safety inspector Details (indicate cue type and reason): deferred           General ADL Comments: anticipate baseline     Vision Baseline Vision/History: 0 No visual deficits Patient Visual Report: No change from baseline Vision Assessment?: Vision impaired- to be further tested in functional context Additional Comments: pt denies visual deficits, preference to keep eyes closed but when open pt unable to locate items- reaching in all directions to find juice with R hand and therapist asking pt to look at her (right in front of pt) and pt looking all the way to the R.  Continue assessment but anticipate this is near baseline     Perception     Praxis      Pertinent Vitals/Pain Pain Assessment Pain Assessment: Faces Faces Pain Scale: No hurt     Hand Dominance Right   Extremity/Trunk Assessment Upper Extremity Assessment Upper Extremity Assessment: Generalized weakness   Lower Extremity Assessment Lower Extremity Assessment: Defer to PT evaluation RLE Deficits / Details: pt in R KI, R foot with noted swelling, minimal initiation to move R LE LLE Deficits / Details: generalized weakness of 3-/5, unable to stand, did initiate movement of L LE in the bed   Cervical / Trunk Assessment Cervical / Trunk Assessment: Normal   Communication Communication Communication: No difficulties (soft spoken)   Cognition Arousal/Alertness: Awake/alert Behavior During Therapy: Flat affect Overall Cognitive Status: History of cognitive impairments - at baseline                                 General Comments: Pt with h/o dementia. Pt did state name, birthday and that is was middle of october. Pt stated [facility], pt reoriented to [facility] but no recall at end of session. Pt  followed simple commands majority of time. Pt with good effort and engagement with therapists     General Comments  R foot with noted edema, per chart pt with stage 3 decub ulcer on buttocks, unobserved as it was covered by dressing    Exercises     Shoulder Instructions      Home Living Family/patient expects to be discharged to:: Private residence Living Arrangements: Spouse/significant other;Other (Comment) (grandson, 48?) Available Help at Discharge: Family;Available 24 hours/day Type of Home: House Home Access: Stairs to enter [employer] of Steps: 2 Entrance Stairs-Rails: Right;Left;Can reach both Home Layout: One level     Bathroom Shower/Tub: Chief Strategy Officer: Wheelchair - manual   Additional Comments: from previous admissions charting, patients wife reported that patient was able to complete transfers from bed to wheelchair himself and only just prior to admission to hospital did he need more physical assit from grandson. grandson lives with them but is not home 24/7  Lives With: Spouse    Prior Functioning/Environment Prior Level of Function : Needs assist             Mobility Comments: per chart pt unable to  walk, grandson to help with transfers. pt poor historian unsure how often pt is getting OOB as pt with stage 3 ulcer on buttocks ADLs Comments: per chart pt dependent for all ADLs, pt can sometimes feed self but also requires assistance most of the time        OT Problem List: Decreased strength;Decreased activity tolerance;Impaired balance (sitting and/or standing);Impaired vision/perception;Decreased safety awareness;Decreased coordination;Decreased knowledge of use of DME or AE;Decreased knowledge of precautions      OT Treatment/Interventions:      OT Goals(Current goals can be found in the care plan section) Acute Rehab OT Goals Patient Stated Goal: none stated OT Goal Formulation: Patient unable to participate in  goal setting  OT Frequency:      Co-evaluation PT/OT/SLP Co-Evaluation/Treatment: Yes Reason for Co-Treatment: Necessary to address cognition/behavior during functional activity;For patient/therapist safety;To address functional/ADL transfers PT goals addressed during session: Mobility/safety with mobility OT goals addressed during session: ADL's and self-care      AM-PAC OT "6 Clicks" Daily Activity     Outcome Measure Help from another person eating meals?: A Lot Help from another person taking care of personal grooming?: A Lot Help from another person toileting, which includes using toliet, bedpan, or urinal?: Total Help from another person bathing (including washing, rinsing, drying)?: Total Help from another person to put on and taking off regular upper body clothing?: Total Help from another person to put on and taking off regular lower body clothing?: Total 6 Click Score: 8   End of Session Equipment Utilized During Treatment: Gait belt Nurse Communication: Mobility status  Activity Tolerance: Patient tolerated treatment well Patient left: in bed;with call bell/phone within reach;with bed alarm set  OT Visit Diagnosis: Other abnormalities of gait and mobility (R26.89);Muscle weakness (generalized) (M62.81);Low vision, both eyes (H54.2)                Time: 4327-6147 OT Time Calculation (min): 24 min Charges:  OT General Charges $OT Visit: 1 Visit OT Evaluation $OT Eval Moderate Complexity: [place], OT Acute Rehabilitation Services Office [phone]   Delight Stare 04/03/2022, 11:07 AM

## 2022-04-03 NOTE — Plan of Care (Signed)
  Problem: Education: Goal: Knowledge of General Education information will improve Description: Including pain rating scale, medication(s)/side effects and non-pharmacologic comfort measures Outcome: Progressing   Problem: Health Behavior/Discharge Planning: Goal: Ability to manage health-related needs will improve Outcome: Progressing   Problem: Clinical Measurements: Goal: Ability to maintain clinical measurements within normal limits will improve Outcome: Progressing Goal: Will remain free from infection Outcome: Progressing Goal: Diagnostic test results will improve Outcome: Progressing Goal: Respiratory complications will improve Outcome: Progressing Goal: Cardiovascular complication will be avoided Outcome: Progressing   Problem: Activity: Goal: Risk for activity intolerance will decrease Outcome: Progressing   Problem: Nutrition: Goal: Adequate nutrition will be maintained Outcome: Progressing   Problem: Coping: Goal: Level of anxiety will decrease Outcome: Progressing   Problem: Elimination: Goal: Will not experience complications related to bowel motility Outcome: Progressing Goal: Will not experience complications related to urinary retention Outcome: Progressing   Problem: Pain Managment: Goal: General experience of comfort will improve Outcome: Progressing   Problem: Safety: Goal: Ability to remain free from injury will improve Outcome: Progressing   Problem: Skin Integrity: Goal: Risk for impaired skin integrity will decrease Outcome: Progressing   Problem: Education: Goal: Knowledge of disease or condition will improve Outcome: Progressing Goal: Knowledge of secondary prevention will improve (SELECT ALL) Outcome: Progressing Goal: Knowledge of patient specific risk factors will improve (INDIVIDUALIZE FOR PATIENT) Outcome: Progressing   Problem: Health Behavior/Discharge Planning: Goal: Ability to manage health-related needs will  improve Outcome: Progressing   Problem: Self-Care: Goal: Ability to participate in self-care as condition permits will improve Outcome: Progressing Goal: Verbalization of feelings and concerns over difficulty with self-care will improve Outcome: Progressing Goal: Ability to communicate needs accurately will improve Outcome: Progressing   Problem: Nutrition: Goal: Risk of aspiration will decrease Outcome: Progressing   Problem: Ischemic Stroke/TIA Tissue Perfusion: Goal: Complications of ischemic stroke/TIA will be minimized Outcome: Progressing

## 2022-04-03 NOTE — Consult Note (Signed)
   [facility] CM Inpatient Consult   04/03/2022  Mike Mack 14-Jan-1936 [id number]  [employer] [ACO] Patient: [name]  Primary Care Provider:  [name], MD, with [facility]  Patient screened for less than 30 days readmission hospitalization with noted high risk score for unplanned readmission risk and  to assess for potential [place] Management service needs for post hospital transition.  Review of patient's medical record reveals patient is consulted by Palliative Care [awaiting result of outcome].   Plan:  Continue to follow progress and disposition to assess for post hospital care management needs.    For questions contact:   [name], RN BSN [facility] Liaison  [phone] business mobile phone Toll free office [phone]  Fax number: [phone] [email or web address]

## 2022-04-03 NOTE — Progress Notes (Addendum)
Palliative:  HPI: 86 y.o. male  with past medical history of stroke, memory loss/cognitive impairment (likely vascular dementia), possible parkinsonism, CKD stage 3, hypertension, diabetes, polio, right tibial fracture, recently discharged from rehab stay after admission 9/12-9/21/23 for UTI sepsis with renal failure and GI bleed admitted on 04/01/2022 with stroke, UTI, dehydration with AKI.    I met again today with Mike Mack. He is resting comfortably but has quicker and more appropriate verbal response today. He is not as agitated today.   I called and spoke with wife, [name]. [name] was at bedside earlier today. She believes that Mike Mack looks better today. She is hopeful to continue treating his infection and helping him to do as well as possible with plans to return home. Dorothy and grandson care for Mike Mack at home. [name] has good understanding of challenges ahead with [name] cognitive and physical decline. She is realistic and desire him to have optimized quality of life and comfort for the rest of his days. We have discussed the potential of hospice care at home but have decided to begin with palliative support and will consider hospice in the home in the future as indicated. He returned home from rehab stay only Wednesday. She inquires about a lift for in the home - I have passed question on to [facility]. She is prepared to receive him back home with home health and palliative support when medically ready.   All questions/concerns addressed. Emotional support provided.   Exam: Resting comfortably. Oriented to person only. No distress. Much less agitated. More appropriate verbal response to simple questions. He has no complaints. Breathing regular, unlabored.   Plan: - DNR - Optimize medically - Return home with home health and palliative support - Wife would be open to consideration of hospice support in the future  25 min   Vinie Sill, NP Palliative Medicine Team Pager  [phone] (Please see [email or web address] for schedule) Team Phone [phone]    Greater than 50%  of this time was spent counseling and coordinating care related to the above assessment and plan

## 2022-04-03 NOTE — Progress Notes (Signed)
Echocardiogram 2D Echocardiogram has been performed.  Mike Mack 04/03/2022, 3:05 PM

## 2022-04-03 NOTE — TOC Initial Note (Signed)
Transition of Care [facility]) - Initial/Assessment Note    Patient Details  Name: Mike Mack MRN: [id number] Date of Birth: 24-Jan-1936  [facility] CM/SW Contact:    Pollie Friar, RN Phone Number: 04/03/2022, 11:39 AM  Clinical Narrative:                 CM met with the patient and his spouse at the bedside. Pt slept through visit. Wife states she has been caring for him at home since d/c from rehab with the assistance of their grandson. When grandson is at work she manages him on her own.  They have a hospital bed with a regular mattress. CM inquired about an air mattress to see if this would help his stage 3 wound.  Pt was active with [facility] as they had just made the first opening visit prior to pt being readmitted. PT/OT/RN Wife also interested in assistance with transportation. She states the grandson gets the pt into the wheelchair but she usually does the MD appts. CM will send in SCAT application so she can ride with him in his wheelchair to appointments.  Will follow to see the direction of d/c after palliative care re-visits today.    Expected Discharge Plan: [place] Barriers to Discharge: Continued Medical Work up   Patient Goals and CMS Choice   CMS Medicare.gov Compare Post Acute Care list provided to:: Patient Represenative (must comment) Choice offered to / list presented to : Spouse  Expected Discharge Plan and Services Expected Discharge Plan: [place]   Discharge Planning Services: CM Consult Post Acute Care Choice: [place] arrangements for the past 2 months: Single Family Home                 DME Arranged: Air overlay mattress DME Agency: [employer]       HH Arranged: RN, PT, OT, Nurse's Aide, Social Work [employer] Agency: [place]        Prior Living Arrangements/Services Living arrangements for the past 2 months: [place] Lives with:: Spouse Patient  language and need for interpreter reviewed:: Yes Do you feel safe going back to the place where you live?: Yes      Need for Family Participation in Patient Care: Yes (Comment) Care giver support system in place?: Yes (comment) Current home services: DME, Home PT (hospital bed/ wheelchair) Criminal Activity/Legal Involvement Pertinent to Current Situation/Hospitalization: No - Comment as needed  Activities of Daily Living Home Assistive Devices/Equipment: Wheelchair ADL Screening (condition at time of admission) Patient's cognitive ability adequate to safely complete daily activities?: No Is the patient deaf or have difficulty hearing?: No Does the patient have difficulty seeing, even when wearing glasses/contacts?: Yes Does the patient have difficulty concentrating, remembering, or making decisions?: Yes Patient able to express need for assistance with ADLs?: No Does the patient have difficulty dressing or bathing?: Yes Independently performs ADLs?: No Does the patient have difficulty walking or climbing stairs?: Yes Weakness of Legs: Both Weakness of Arms/Hands: Both  Permission Sought/Granted                  Emotional Assessment Appearance:: Appears stated age         Psych Involvement: No (comment)  Admission diagnosis:  AKI (acute kidney injury) ([place]) [N17.9] Hypothermia, initial encounter [T68.XXXA] Acute CVA (cerebrovascular accident) ([place]) [I63.9] Altered mental status, unspecified altered mental status type [R41.82] Patient Active Problem List   Diagnosis  Date Noted   acute CVA vs. artifact  04/01/2022   Hypothermia 04/01/2022   recent GI bleed in 02/2022 04/01/2022   Right tibial fracture 04/01/2022   Pressure injury of skin 03/09/2022   Pleural effusion, bilateral 10/21/2021   Malnutrition of moderate degree 02/23/2021   UTI (urinary tract infection) 02/22/2021   Altered mental status 02/21/2021   CKD (chronic kidney disease) stage 3, GFR 30-59 ml/min  (HCC) 02/21/2021   Dementia ([place]) 11/27/2020   Polio osteopathy of lower leg, left ([place]) 11/27/2020   Subphrenic abscess ([place]) 03/19/2020   Failure to thrive (child) 03/19/2020   Posterior vitreous detachment of both eyes 12/11/2019   Syncope 10/25/2014   AKI (acute kidney injury) ([place]) 10/25/2014   H/O: CVA (cerebrovascular accident) 10/25/2014   H/O poliomyelitis 10/25/2014   Type 2 diabetes mellitus ([place]) 10/25/2014   Tachycardia 08/29/2013   Cough 08/29/2013   Monoparesis of leg ([place]) 08/20/2013   Muscle weakness of lower extremity 08/20/2013   Right upper quadrant abdominal abscess ([place]) 08/15/2013   Hepatic abscess 08/15/2013   Subacute delirium 08/14/2013   Hyperglycemia 08/14/2013   Oral thrush 08/14/2013   Leukocytosis, unspecified 08/14/2013   Debility 08/14/2013   S/P cholecystectomy 07/29/2013   Cholecystitis with cholelithiasis 07/25/2013   Arthritis 06/06/2013   Headache(784.0) 06/06/2013   Headache 06/06/2013   Generalized weakness 05/24/2013   Cholecystitis, acute 05/24/2013   RUQ abdominal pain 05/13/2013   Acute cholecystitis: Probable 05/13/2013   Constipation 05/13/2013   Enterococcus UTI 05/13/2013   Leukocytosis 05/13/2013   Anemia 05/13/2013   Acute left thalamic CVA (cerebral infarction) 04/10/2012   Hypertension    BPH (benign prostatic hyperplasia)    Pruritic disorder 02/02/2011   PCP:  [name], MD Pharmacy:   [id number]: [phone]: [phone]    Social Determinants of Health (SDOH) Interventions    Readmission Risk Interventions     No data to display

## 2022-04-03 NOTE — Evaluation (Signed)
Speech Language Pathology Evaluation Patient Details Name: Mike Mack MRN: [id number] DOB: January 19, 1936 Today's Date: 04/03/2022 Time: 1025-8527 SLP Time Calculation (min) (ACUTE ONLY): 16 min  Problem List:  Patient Active Problem List   Diagnosis Date Noted   acute CVA vs. artifact  04/01/2022   Hypothermia 04/01/2022   recent GI bleed in 02/2022 04/01/2022   Right tibial fracture 04/01/2022   Pressure injury of skin 03/09/2022   Pleural effusion, bilateral 10/21/2021   Malnutrition of moderate degree 02/23/2021   UTI (urinary tract infection) 02/22/2021   Altered mental status 02/21/2021   CKD (chronic kidney disease) stage 3, GFR 30-59 ml/min (HCC) 02/21/2021   Dementia ([place]) 11/27/2020   Polio osteopathy of lower leg, left ([place]) 11/27/2020   Subphrenic abscess ([place]) 03/19/2020   Failure to thrive (child) 03/19/2020   Posterior vitreous detachment of both eyes 12/11/2019   Syncope 10/25/2014   AKI (acute kidney injury) ([place]) 10/25/2014   H/O: CVA (cerebrovascular accident) 10/25/2014   H/O poliomyelitis 10/25/2014   Type 2 diabetes mellitus ([place]) 10/25/2014   Tachycardia 08/29/2013   Cough 08/29/2013   Monoparesis of leg ([place]) 08/20/2013   Muscle weakness of lower extremity 08/20/2013   Right upper quadrant abdominal abscess ([place]) 08/15/2013   Hepatic abscess 08/15/2013   Subacute delirium 08/14/2013   Hyperglycemia 08/14/2013   Oral thrush 08/14/2013   Leukocytosis, unspecified 08/14/2013   Debility 08/14/2013   S/P cholecystectomy 07/29/2013   Cholecystitis with cholelithiasis 07/25/2013   Arthritis 06/06/2013   Headache(784.0) 06/06/2013   Headache 06/06/2013   Generalized weakness 05/24/2013   Cholecystitis, acute 05/24/2013   RUQ abdominal pain 05/13/2013   Acute cholecystitis: Probable 05/13/2013   Constipation 05/13/2013   Enterococcus UTI 05/13/2013   Leukocytosis 05/13/2013   Anemia 05/13/2013   Acute left thalamic CVA (cerebral infarction)  04/10/2012   Hypertension    BPH (benign prostatic hyperplasia)    Pruritic disorder 02/02/2011   Past Medical History:  Past Medical History:  Diagnosis Date   Anemia 05/13/2013   Arthritis    "right leg" (07/25/2013)   BPH (benign prostatic hyperplasia)    Constipation 05/13/2013   Exertional shortness of breath    "sometimes" (07/25/2013)   History of kidney stones    History of stomach ulcers    Hypertension    Neuropathy    Polio    Polio Childhood   Small bowel obstruction ([place])    Stroke ([place]) 2014   residual:  "left hand kind of numb" (07/25/2013)   Type II diabetes mellitus ([place])    Past Surgical History:  Past Surgical History:  Procedure Laterality Date   BIOPSY  11/30/2020   Procedure: BIOPSY;  Surgeon: Ronnette Juniper, MD;  Location: [facility];  Service: Gastroenterology;;   BIOPSY  03/03/2022   Procedure: BIOPSY;  Surgeon: Clarene Essex, MD;  Location: [facility];  Service: Gastroenterology;;   BOWEL RESECTION     CATARACT EXTRACTION W/ INTRAOCULAR LENS  IMPLANT, BILATERAL Bilateral 2000's   CHOLECYSTECTOMY N/A 07/25/2013   Procedure: LAPAROSCOPIC CHOLECYSTECTOMY WITH INTRAOPERATIVE CHOLANGIOGRAM;  Surgeon: [name], MD;  Location: [facility];  Service: General;  Laterality: N/A;   COLON SURGERY     COLONOSCOPY     Hx: of   CYSTOSCOPY WITH BIOPSY N/A 12/01/2019   Procedure: CYSTOSCOPY WITH BLADDER BIOPSY AND FULGURATION;  Surgeon: Lucas Mallow, MD;  Location: [facility];  Service: Urology;  Laterality: N/A;   ESOPHAGOGASTRODUODENOSCOPY N/A 11/30/2020   Procedure: ESOPHAGOGASTRODUODENOSCOPY (EGD);  Surgeon: Ronnette Juniper, MD;  Location: [facility];  Service: Gastroenterology;  Laterality: N/A;   ESOPHAGOGASTRODUODENOSCOPY N/A 03/03/2022   Procedure: ESOPHAGOGASTRODUODENOSCOPY (EGD);  Surgeon: Clarene Essex, MD;  Location: [facility];  Service: Gastroenterology;  Laterality: N/A;   EXCISIONAL HEMORRHOIDECTOMY  2000's   EXTRACORPOREAL SHOCK WAVE LITHOTRIPSY Right  10/03/2018   Procedure: EXTRACORPOREAL SHOCK WAVE LITHOTRIPSY (ESWL);  Surgeon: Festus Aloe, MD;  Location: [facility];  Service: Urology;  Laterality: Right;   EYE SURGERY     INGUINAL HERNIA REPAIR Right 1970's   IR RADIOLOGIST EVAL & MGMT  04/06/2020   LAPAROSCOPIC CHOLECYSTECTOMY  07/25/2013   w/LOA (07/25/2013)   HPI:  86 y/o male admitted with AMS, found slumped over in chair, not talking by spouse. MRI shows possible tiny subacute infarct in the posterior frontal white matter versus artifact. EEG was negative for seizures and suggestive of mild to moderate diffuse encephalopathy. MH: stroke, memory loss, possible parkinsonism, CKD stage 3, hypertension, diabetes, polio, right tibial fracture. Pt seen by SLP 03/01/22-03/07/22 when pt was admitted for UTI and was able to be progressed to dysphagia 3 and thin liquids.   Assessment / Plan / Recommendation Clinical Impression  Pt participated in speech-language-cognition evaluation with his wife present towards the end. Pt's wife stated that she manages the pt's medications and finances due to his impairments in cognition. She reported that the pt has "good days and bad days" with him being able to participate in conversation on good days, but not being able to attend to stimuli on bad days. Pt's cognitive-linguistic skills were assessed informally with subtests of the [facility] Mental Status Examination and other informal measures. Pt exhibited cognitive impairments in the areas of attention, orientation, memory, problem solving and executive function. Pt exhibited difficulty global coherence and following commands; SLP suspects that these may be attributable to impairments in attention and memory. Based on the SLP's description of the pt's performance, pt's wife stated that the pt appears to at the lower end of his baseline (i.e., "a bad day"). The potential impact of the unfamiliar environment on pt's cognition was discussed. It was agreed  that SLP intervention will be deferred at this time with plan to re-assess after discharge if this appears indicated when he returns to his familiar environment. SLP will follow for dysphagia only at this time.    SLP Assessment  SLP Recommendation/Assessment: All further Speech Lanaguage Pathology  needs can be addressed in the next venue of care SLP Visit Diagnosis: Cognitive communication deficit (R41.841)    Recommendations for follow up therapy are one component of a multi-disciplinary discharge planning process, led by the attending physician.  Recommendations may be updated based on patient status, additional functional criteria and insurance authorization.    Follow Up Recommendations  Skilled nursing-short term rehab (<3 hours/day)    Assistance Recommended at Discharge  Frequent or constant Supervision/Assistance  Functional Status Assessment Patient has not had a recent decline in their functional status  Frequency and Duration           SLP Evaluation Cognition  Overall Cognitive Status: History of cognitive impairments - at baseline Arousal/Alertness: Lethargic Orientation Level: Oriented to person;Oriented to place;Disoriented to time;Disoriented to situation Year:  (November) Month: December Day of Week: Correct Attention: Focused;Sustained Focused Attention: Impaired Focused Attention Impairment: Verbal basic Sustained Attention: Impaired Sustained Attention Impairment: Verbal basic Memory: Impaired Memory Impairment: Retrieval deficit;Decreased recall of new information (Immediate: 2/5 despite repetition; delayed: 0/5; with ues:) Awareness: Impaired Awareness Impairment: Intellectual impairment Problem Solving: Impaired Problem  Solving Impairment: Verbal basic Executive Function: Sequencing;Organizing       Comprehension  Auditory Comprehension Overall Auditory Comprehension: Impaired at baseline Yes/No Questions: Impaired Basic Immediate Environment  Questions:  (4/4) Complex Questions:  (3/5) Commands: Impaired    Expression Expression Primary Mode of Expression: Verbal Verbal Expression Overall Verbal Expression: Impaired at baseline Level of Generative/Spontaneous Verbalization: Sentence Pragmatics: Impairment   Oral / Motor  Motor Speech Respiration: Within functional limits Phonation: Low vocal intensity Articulation: Impaired Level of Impairment: Sentence Intelligibility: Intelligibility reduced Word: 75-100% accurate Phrase: 75-100% accurate Sentence: 75-100% accurate Interfering Components:  (lethargy)           Jonhatan Hearty I. Hardin Negus, [place], [place] Office number [phone]  Horton Marshall 04/03/2022, 10:32 AM

## 2022-04-03 NOTE — Evaluation (Signed)
Physical Therapy Evaluation Patient Details Name: Mike Mack MRN: [id number] DOB: 1935/10/07 Today's Date: 04/03/2022  History of Present Illness  Mike Mack is a 86 y.o. M with PMH of Polio and CVA with residual R-sided weakness, HTN, CKD, Type 2 DM, SBO , recent right tibial plateau fracture/NWB brought to Ed 02/28/22  after slumping over in [facility] , Found to have UTI and AKI,  hypotensive , developed dark red stools, s/p  upper  GI endo.   Clinical Impression  Pt admitted with above. Pt is non-ambulatory at baseline. Pt with noted stage 3 decub ulcer on buttocks. Family continues to desire to take pt home. Recommend HHPT to assess home set up and assist with positioning of patient and developing safe transfer technique to minimize burden of care on family. Pt was interactive with PT/OT and was orientedx3. Pt followed majority of one step commands but continues to require maxA for all mobility. Mobility complicated by R tibial plateau fx and in R KI. Acute PT to cont to follow.       Recommendations for follow up therapy are one component of a multi-disciplinary discharge planning process, led by the attending physician.  Recommendations may be updated based on patient status, additional functional criteria and insurance authorization.  Follow Up Recommendations Home health PT      Assistance Recommended at Discharge Frequent or constant Supervision/Assistance  Patient can return home with the following  A lot of help with walking and/or transfers;A lot of help with bathing/dressing/bathroom;Assistance with cooking/housework;Assistance with feeding;Direct supervision/assist for medications management;Direct supervision/assist for financial management;Assist for transportation;Help with stairs or ramp for entrance    Equipment Recommendations Other (comment) (air mattress for hospital bed)  Recommendations for Other Services       Functional Status Assessment Patient has had a recent  decline in their functional status and demonstrates the ability to make significant improvements in function in a reasonable and predictable amount of time.     Precautions / Restrictions Precautions Precautions: Fall Precaution Comments: R KI, h/o R tibial plataeu fx - non-op Required Braces or Orthoses: Knee Immobilizer - Right Knee Immobilizer - Right: On at all times Restrictions Weight Bearing Restrictions: Yes RLE Weight Bearing: Non weight bearing (suspect NWB with the noted fracture)      Mobility  Bed Mobility Overal bed mobility: Needs Assistance Bed Mobility: Rolling, Sidelying to Sit Rolling: Mod assist, +2 for physical assistance Sidelying to sit: Max assist, +2 for physical assistance       General bed mobility comments: max directional verbal cues, hand over hand with L UE to use bed rail to help roll, maxA for trunk elevation and to bring LEs off EOB    Transfers Overall transfer level: Needs assistance Equipment used: 2 person hand held assist Transfers: Sit to/from Stand Sit to Stand: Max assist, +2 physical assistance           General transfer comment: attempted to stand x 2, unable to achieve full upright standing, maxA to maintain R LE NWB, pt did initiate transfer via pushing up from bed    Ambulation/Gait               General Gait Details: unable, has been non-ambulatory for months  Stairs            Wheelchair Mobility    Modified Rankin (Stroke Patients Only) Modified Rankin (Stroke Patients Only) Pre-Morbid Rankin Score: Severe disability Modified Rankin: Severe disability     Balance Overall balance assessment: Needs  assistance Sitting-balance support: Feet supported, No upper extremity supported Sitting balance-Leahy Scale: Poor Sitting balance - Comments: pt initially with strong R lateral lean, with max verbal and tactile cues pt able to maintain midline with close min guard and intermittent use of bilat UE for  support Postural control: Right lateral lean                                   Pertinent Vitals/Pain Pain Assessment Pain Assessment: Faces Faces Pain Scale: No hurt    Home Living Family/patient expects to be discharged to:: Private residence (Simultaneous filing. User may not have seen previous data.) Living Arrangements: Spouse/significant other;Other (Comment) (grandson (23?)  Simultaneous filing. User may not have seen previous data.) Available Help at Discharge: Family;Available 24 hours/day Type of Home: House Home Access: Stairs to enter Entrance Stairs-Rails: Right;Left;Can reach both Entrance Stairs-Number of Steps: 2   Home Layout: One level Home Equipment: Wheelchair - manual Additional Comments: from previous admissions charting, patients wife reported that patient was able to complete transfers from bed to wheelchair himself and only just prior to admission to hospital did he need more physical assit from grandson. grandson lives with them but is not home 24/7    Prior Function Prior Level of Function : Needs assist             Mobility Comments: per chart pt unable to walk, grandson to help with transfers. pt poor historian unsure how often pt is getting OOB as pt with stage 3 ulcer on buttocks ADLs Comments: per chart pt dependent for all ADLs, pt can sometimes feed self but also requires assistance most of the time     Hand Dominance   Dominant Hand: Right    Extremity/Trunk Assessment   Upper Extremity Assessment Upper Extremity Assessment: Defer to OT evaluation    Lower Extremity Assessment Lower Extremity Assessment: RLE deficits/detail;LLE deficits/detail RLE Deficits / Details: pt in R KI, R foot with noted swelling, minimal initiation to move R LE LLE Deficits / Details: generalized weakness of 3-/5, unable to stand, did initiate movement of L LE in the bed    Cervical / Trunk Assessment Cervical / Trunk Assessment: Normal   Communication   Communication: No difficulties (soft spoken)  Cognition Arousal/Alertness: Awake/alert Behavior During Therapy: Flat affect Overall Cognitive Status: History of cognitive impairments - at baseline                                 General Comments: Pt with h/o dementia. Pt did state name, birthday and that is was middle of october. Pt stated [facility], pt reoriented to [facility] but no recall at end of session. Pt followed simple commands majority of time. Pt with good effort and engagement with therapists        General Comments General comments (skin integrity, edema, etc.): R foot with noted edema, per chart pt with stage 3 decub ulcer on buttocks, unobserved as it was covered by dressing    Exercises     Assessment/Plan    PT Assessment Patient needs continued PT services  PT Problem List Decreased strength;Decreased range of motion;Decreased activity tolerance;Decreased balance;Decreased mobility;Decreased coordination;Decreased cognition;Decreased knowledge of use of DME       PT Treatment Interventions DME instruction;Gait training;Functional mobility training;Therapeutic activities;Therapeutic exercise;Balance training;Neuromuscular re-education    PT Goals (Current goals  can be found in the Care Plan section)  Acute Rehab PT Goals PT Goal Formulation: Patient unable to participate in goal setting Time For Goal Achievement: 04/17/22 Potential to Achieve Goals: Fair    Frequency Min 2X/week     Co-evaluation PT/OT/SLP Co-Evaluation/Treatment: Yes Reason for Co-Treatment: Necessary to address cognition/behavior during functional activity PT goals addressed during session: Mobility/safety with mobility         AM-PAC PT "6 Clicks" Mobility  Outcome Measure Help needed turning from your back to your side while in a flat bed without using bedrails?: A Lot Help needed moving from lying on your back to sitting on the side of a flat bed  without using bedrails?: A Lot Help needed moving to and from a bed to a chair (including a wheelchair)?: Total Help needed standing up from a chair using your arms (e.g., wheelchair or bedside chair)?: Total Help needed to walk in hospital room?: Total Help needed climbing 3-5 steps with a railing? : Total 6 Click Score: 8    End of Session Equipment Utilized During Treatment: Gait belt Activity Tolerance: Patient tolerated treatment well Patient left: in bed;with call bell/phone within reach;with bed alarm set Nurse Communication: Mobility status PT Visit Diagnosis: Muscle weakness (generalized) (M62.81);Difficulty in walking, not elsewhere classified (R26.2)    Time: 0071-2197 PT Time Calculation (min) (ACUTE ONLY): 25 min   Charges:   PT Evaluation $PT Eval Moderate Complexity: 1 Mod          Kittie Plater, PT, DPT Acute Rehabilitation Services Secure chat preferred Office #: [phone]   Berline Lopes 04/03/2022, 10:54 AM

## 2022-04-04 ENCOUNTER — Inpatient Hospital Stay (HOSPITAL_COMMUNITY): Payer: Medicare HMO

## 2022-04-04 DIAGNOSIS — N179 Acute kidney failure, unspecified: Secondary | ICD-10-CM | POA: Diagnosis not present

## 2022-04-04 DIAGNOSIS — F03C11 Unspecified dementia, severe, with agitation: Secondary | ICD-10-CM | POA: Diagnosis not present

## 2022-04-04 DIAGNOSIS — E162 Hypoglycemia, unspecified: Secondary | ICD-10-CM

## 2022-04-04 DIAGNOSIS — R410 Disorientation, unspecified: Secondary | ICD-10-CM | POA: Diagnosis not present

## 2022-04-04 DIAGNOSIS — D649 Anemia, unspecified: Secondary | ICD-10-CM | POA: Diagnosis not present

## 2022-04-04 DIAGNOSIS — I639 Cerebral infarction, unspecified: Secondary | ICD-10-CM | POA: Diagnosis not present

## 2022-04-04 LAB — CBC
HCT: 29.1 % — ABNORMAL LOW (ref 39.0–52.0)
Hemoglobin: 9 g/dL — ABNORMAL LOW (ref 13.0–17.0)
MCH: 30.9 pg (ref 26.0–34.0)
MCHC: 30.9 g/dL (ref 30.0–36.0)
MCV: 100 fL (ref 80.0–100.0)
Platelets: 245 10*3/uL (ref 150–400)
RBC: 2.91 MIL/uL — ABNORMAL LOW (ref 4.22–5.81)
RDW: 16.5 % — ABNORMAL HIGH (ref 11.5–15.5)
WBC: 5.6 10*3/uL (ref 4.0–10.5)
nRBC: 0 % (ref 0.0–0.2)

## 2022-04-04 LAB — BASIC METABOLIC PANEL
Anion gap: 8 (ref 5–15)
BUN: 15 mg/dL (ref 8–23)
CO2: 18 mmol/L — ABNORMAL LOW (ref 22–32)
Calcium: 8.1 mg/dL — ABNORMAL LOW (ref 8.9–10.3)
Chloride: 114 mmol/L — ABNORMAL HIGH (ref 98–111)
Creatinine, Ser: 0.93 mg/dL (ref 0.61–1.24)
GFR, Estimated: >60 mL/min (ref >60)
Glucose, Bld: 64 mg/dL — ABNORMAL LOW (ref 70–99)
Potassium: 3.6 mmol/L (ref 3.5–5.1)
Sodium: 140 mmol/L (ref 135–145)

## 2022-04-04 LAB — GLUCOSE, CAPILLARY
Glucose-Capillary: 126 mg/dL — ABNORMAL HIGH (ref 70–99)
Glucose-Capillary: 152 mg/dL — ABNORMAL HIGH (ref 70–99)
Glucose-Capillary: 65 mg/dL — ABNORMAL LOW (ref 70–99)
Glucose-Capillary: 87 mg/dL (ref 70–99)
Glucose-Capillary: 95 mg/dL (ref 70–99)

## 2022-04-04 LAB — MAGNESIUM: Magnesium: 1.7 mg/dL (ref 1.7–2.4)

## 2022-04-04 MED ORDER — MAGNESIUM SULFATE 2 GM/50ML IV SOLN
2.0000 g | Freq: Once | INTRAVENOUS | Status: AC
  Administered 2022-04-04: 2 g via INTRAVENOUS
  Filled 2022-04-04: qty 50

## 2022-04-04 MED ORDER — JUVEN PO PACK
1.0000 | PACK | Freq: Two times a day (BID) | ORAL | Status: DC
  Administered 2022-04-04 – 2022-04-05 (×2): 1 via ORAL
  Filled 2022-04-04 (×2): qty 1

## 2022-04-04 MED ORDER — ADULT MULTIVITAMIN W/MINERALS CH
1.0000 | ORAL_TABLET | Freq: Every day | ORAL | Status: DC
  Administered 2022-04-04 – 2022-04-05 (×2): 1 via ORAL
  Filled 2022-04-04 (×2): qty 1

## 2022-04-04 NOTE — Progress Notes (Addendum)
STROKE TEAM PROGRESS NOTE   INTERVAL HISTORY Patient's wife is at bedside. Patient was more responsive and awake this morning, responding to questions. Still fairly confused with details like the time and place but was able to state his name and DOB.  Vitals:   04/03/22 2351 04/04/22 0404 04/04/22 0759 04/04/22 1158  BP: (!) 152/71 (!) 159/79 (!) 172/84 (!) 148/78  Pulse: 80 88 91 98  Resp: '16 16 20 17  '$ Temp: 97.7 F (36.5 C) 97.9 F (36.6 C) 97.7 F (36.5 C) 97.7 F (36.5 C)  TempSrc: Axillary Oral Axillary Axillary  SpO2: 100% 97% 100% 100%   CBC:  Recent Labs  Lab 04/01/22 1250 04/01/22 1254 04/03/22 0428 04/04/22 0335  WBC 10.9*  --  6.2 5.6  NEUTROABS 9.1*  --   --   --   HGB 10.6*   < > 8.3* 9.0*  HCT 34.0*   < > 26.8* 29.1*  MCV 100.3*  --  99.6 100.0  PLT 340  --  255 245   < > = values in this interval not displayed.   Basic Metabolic Panel:  Recent Labs  Lab 04/01/22 2145 04/03/22 0428 04/04/22 0335  NA  --  141 140  K  --  3.2* 3.6  CL  --  111 114*  CO2  --  22 18*  GLUCOSE  --  48* 64*  BUN  --  24* 15  CREATININE  --  1.32* 0.93  CALCIUM  --  8.1* 8.1*  MG 2.0  --  1.7   Lipid Panel:  Recent Labs  Lab 04/01/22 2145  CHOL 94  TRIG 59  HDL 44  CHOLHDL 2.1  VLDL 12  LDLCALC 38   HgbA1c:  Recent Labs  Lab 04/01/22 1250  HGBA1C 5.5   Urine Drug Screen:  Recent Labs  Lab 04/01/22 1639  LABOPIA NONE DETECTED  COCAINSCRNUR NONE DETECTED  LABBENZ NONE DETECTED  AMPHETMU NONE DETECTED  THCU NONE DETECTED  LABBARB NONE DETECTED    Alcohol Level  Recent Labs  Lab 04/01/22 1250  ETH <10    IMAGING past 24 hours ECHOCARDIOGRAM COMPLETE  Result Date: 04/03/2022    ECHOCARDIOGRAM REPORT   Patient Name:   Mike Mack Date of Exam: 04/03/2022 Medical Rec #:  [id number]      Height:       65.0 in Accession #:    [id number]     Weight:       172.8 lb Date of Birth:  Dec 23, 1935     BSA:          1.859 m Patient Age:    86 years        BP:           164/96 mmHg Patient Gender: M              HR:           76 bpm. Exam Location:  Inpatient Procedure: 2D Echo, Cardiac Doppler and Color Doppler Indications:     Stroke I63.9  History:         Patient has prior history of Echocardiogram examinations, most                  recent 10/27/2014. Stroke; Risk Factors:Hypertension and                  Diabetes.  Sonographer:     Bernadene Person RDCS Referring Phys:  [id number] Orma Flaming Diagnosing Phys:  Gwyndolyn Kaufman MD IMPRESSIONS  1. Left ventricular ejection fraction, by estimation, is 60 to 65%. The left ventricle has normal function. The left ventricle has no regional wall motion abnormalities. Left ventricular diastolic parameters are consistent with Grade I diastolic dysfunction (impaired relaxation).  2. Right ventricular systolic function is normal. The right ventricular size is normal. There is normal pulmonary artery systolic pressure. The estimated right ventricular systolic pressure is 46.8 mmHg.  3. The mitral valve is normal in structure. Trivial mitral valve regurgitation.  4. The aortic valve is tricuspid. There is mild calcification of the aortic valve. Aortic valve regurgitation is not visualized. Aortic valve sclerosis/calcification is present, without any evidence of aortic stenosis.  5. The inferior vena cava is normal in size with greater than 50% respiratory variability, suggesting right atrial pressure of 3 mmHg. Comparison(s): Compared to prior TTE report in 2016, there is no significant change. Conclusion(s)/Recommendation(s): No intracardiac source of embolism detected on this transthoracic study. Consider a transesophageal echocardiogram to exclude cardiac source of embolism if clinically indicated. FINDINGS  Left Ventricle: Left ventricular ejection fraction, by estimation, is 60 to 65%. The left ventricle has normal function. The left ventricle has no regional wall motion abnormalities. The left ventricular internal cavity  size was normal in size. There is  no left ventricular hypertrophy. Left ventricular diastolic parameters are consistent with Grade I diastolic dysfunction (impaired relaxation). Right Ventricle: The right ventricular size is normal. No increase in right ventricular wall thickness. Right ventricular systolic function is normal. There is normal pulmonary artery systolic pressure. The tricuspid regurgitant velocity is 2.57 m/s, and  with an assumed right atrial pressure of 3 mmHg, the estimated right ventricular systolic pressure is 03.2 mmHg. Left Atrium: Left atrial size was normal in size. Right Atrium: Right atrial size was normal in size. Pericardium: There is no evidence of pericardial effusion. Mitral Valve: The mitral valve is normal in structure. Trivial mitral valve regurgitation. Tricuspid Valve: The tricuspid valve is normal in structure. Tricuspid valve regurgitation is trivial. Aortic Valve: The aortic valve is tricuspid. There is mild calcification of the aortic valve. Aortic valve regurgitation is not visualized. Aortic valve sclerosis/calcification is present, without any evidence of aortic stenosis. Pulmonic Valve: The pulmonic valve was normal in structure. Pulmonic valve regurgitation is trivial. Aorta: The aortic root is normal in size and structure. Venous: The inferior vena cava is normal in size with greater than 50% respiratory variability, suggesting right atrial pressure of 3 mmHg. IAS/Shunts: The atrial septum is grossly normal.  LEFT VENTRICLE PLAX 2D LVIDd:         3.90 cm     Diastology LVIDs:         2.40 cm     LV e' medial:    6.83 cm/s LV PW:         0.90 cm     LV E/e' medial:  9.0 LV IVS:        1.00 cm     LV e' lateral:   8.32 cm/s LVOT diam:     2.00 cm     LV E/e' lateral: 7.4 LV SV:         56 LV SV Index:   30 LVOT Area:     3.14 cm  LV Volumes (MOD) LV vol d, MOD A2C: 64.8 ml LV vol d, MOD A4C: 90.5 ml LV vol s, MOD A2C: 24.8 ml LV vol s, MOD A4C: 23.8 ml LV SV MOD A2C:  40.0 ml LV SV MOD A4C:     90.5 ml LV SV MOD BP:      50.5 ml RIGHT VENTRICLE RV S prime:     9.20 cm/s TAPSE (M-mode): 1.9 cm LEFT ATRIUM             Index        RIGHT ATRIUM           Index LA diam:        2.60 cm 1.40 cm/m   RA Area:     10.90 cm LA Vol (A2C):   25.6 ml 13.77 ml/m  RA Volume:   24.30 ml  13.07 ml/m LA Vol (A4C):   22.0 ml 11.83 ml/m LA Biplane Vol: 24.2 ml 13.02 ml/m  AORTIC VALVE LVOT Vmax:   87.00 cm/s LVOT Vmean:  56.600 cm/s LVOT VTI:    0.179 m  AORTA Ao Root diam: 3.30 cm Ao Asc diam:  3.10 cm MITRAL VALVE               TRICUSPID VALVE MV Area (PHT): 3.31 cm    TR Peak grad:   26.4 mmHg MV Decel Time: 229 msec    TR Vmax:        257.00 cm/s MV E velocity: 61.60 cm/s MV A velocity: 66.50 cm/s  SHUNTS MV E/A ratio:  0.93        Systemic VTI:  0.18 m                            Systemic Diam: 2.00 cm Gwyndolyn Kaufman MD Electronically signed by Gwyndolyn Kaufman MD Signature Date/Time: 04/03/2022/3:17:31 PM    Final (Updated)     PHYSICAL EXAM General: Pleasant, well-appearing in bed. No acute distress. Skin: Warm and dry. No obvious rash or lesions. Neuro: A&Ox2, knew his name and age. CN II-XII intact. No cerebellar abnormalities. Moves all extremities. Right leg unable to move, otherwise, normal ROM. 5/5 strength in RUE, LUE, and LLE. Normal sensation. No focal deficit. No dysarthria. Psych: Normal mood and affect  ASSESSMENT/PLAN Mike Mack is a 86 y.o. male with medical history significant of T2DM, HTN, hx of CVA, memory loss, CKD stage 3, polio  Who presented to ED with AMS.  Possible stroke, incidental finding:  tiny subacute infarct versus artifact in the right posterior frontal white matter, likely small vessel disease  Code Stroke CT head No acute abnormality. ASPECTS 10.    MRI  Punctate focus of DWI hyperintensity in the right posterior frontal white matter without definite ADC correlate. This could represent a tiny subacute infarct versus artifact.  Chronic microvascular ischemic disease and bilateral remote cerebellar infarcts. MRA head Significantly motion limited study without evidence of proximal large vessel occlusion. VAS [place] - pt not cooperative, will cancel, not change management.  2D Echo EF 60 to 65% LDL 38 HgbA1c 5.5 VTE prophylaxis - Lovenox No antithrombotic prior to admission, now on ASA '81mg'$ , no DAPT due to GI bleed history one month ago. Therapy recommendations:  home health PT/OT Disposition:  palliative care on board, return home with home health and palliative support  Encephalopathy Likely 2/2 UTI, urosepsis, AKI, hypothermia, hypotension.  EEG was negative for seizures and suggestive of mild to moderate diffuse encephalopathy Now AKI hypotension and hypothermia all resolved and UTI treated Pt clinically much improved  UTI Urosepsis Presented with hypotension and hypothermia UA WBC > 50 Urine culture pending Leukocytosis WBC 10.9->6.2->5.6 History of  urosepsis, septic shock and AKI in 02/2022  AKI, resolved -Creatinine 1.90>1.32->0.93 -On IV fluid -Continue to monitor   History of stroke 03/2012 admitted for right-sided weakness and numbness.  MRI showed left thalamic infarct.  LDL 93, A1c 7.9, EF 65 to 70%, MRI and carotid Doppler unremarkable.  Discharged on aspirin.  Hypotension Hx of Hypertension Home meds:  norvasc, lopressor, benicar Low BP on presentation 90s Now BP stable, on IVF Long-term BP goal normotensive  Hyperlipidemia Home meds:  lipitor 20, restarted in hospital LDL 38, goal < 70 Continue statin at discharge  Diabetes type II Uncontrolled Home meds:  insulin 14 units HgbA1c 7.5, goal < 7.0 CBGs SSI Close PCP follow-up for better DM control  Other Stroke Risk Factors Advanced Age >/= 25   Other Active Problems  Dementia (HCC) -Difficult to say if his AMS is near baseline. -ex vacuo vs. NPH on serial CT head -Delirium precautions -Continue namenda and remeron    Hypothermia Infectious vs. Age/inactivity and low body weight -Cultures pending, continue rocephin -Room warm with blankets and temp improving.  -Check TSH    recent GI bleed in 02/2022 -S/p EGD which showed gastric and duodenal ulcers and erosion without active bleeding. S/p 1 unit RBC -ASA was held PTA -Has not been taking his PPI '40mg'$  BID x 6 weeks -Starting IV while NPO -resume ASA 81    Right tibial fracture Right LE polio  Fell in August and broke leg Non weight bearing Has not followed up with ortho as he keeps ending up back in hospital  He was bedbound before he fell    Hospital day # 3  Stormy Fabian, MD PGY-1 Psychiatry  ATTENDING NOTE: I reviewed above note and agree with the assessment and plan. Pt was seen and examined.   Wife at bedside.  Patient reclining in bed, awake alert, much improved mental status, answer questions and follow commands, oriented to self and people, but not to age, time or place.  Still has mild right facial droop but bilateral upper extremity equal strength, left lower extremity 3 -/5, right lower extremity chronic plegia.  Creatinine normalized, UTI treated, hypotension and hypothermia all resolved.  Continue aspirin and statin.  Palliative care on board, recommend return home with home health and palliative support.  DC carotid Doppler which will not change management.  Discussed with Dr. [place] Pink.  For detailed assessment and plan, please refer to above/below as I have made changes wherever appropriate.   Neurology will sign off. Please call with questions. Pt will follow up with stroke clinic NP at [facility] in about 4 weeks. Thanks for the consult.   Rosalin Hawking, MD PhD Stroke Neurology 04/04/2022 6:18 PM      To contact Stroke Continuity provider, please refer to [email or web address] After hours, contact General Neurology

## 2022-04-04 NOTE — Progress Notes (Signed)
[facility] PROGRESS NOTE   Mike Mack XLK:[id number] DOB: Dec 10, 1935 DOA: 04/01/2022  PCP: [name], MD  Brief History/Interval Summary: 86 y.o. male with medical history significant of T2DM, HTN, hx of CVA, memory loss, CKD stage 3, polio who presented to ED with AMS. His wife gave him his breakfast at 6:30am and she states he was his normal self. At 8am she got him up and dressed and in his WC.  She went to go check on him around 11-11:30 and he was slumped over and not talking to her. Ems was called. At baseline he carries on a normal conversation.  He knows family members, doesn't know date and sometimes confused as to where he is. Needs help with all ADLs. He does feed himself.  Patient was found to have a small stroke in the right hemisphere.  Neurology was consulted.  Patient was hospitalized for further management. Just admitted to [facility] from 9/12-9/21/23 for septic shock secondary to UTI and required stay in ICU on pressors. Also had acute on chronic renal failure and GI bleeding. EGD showed gastric and duodenal ulcers and erosion without active bleeding. S/p 1 unit PRBC.    Consultants: Neurology.  Palliative care  Procedures:  EEG was negative for epileptiform activity or seizures Transthoracic echocardiogram    Subjective/Interval History: Patient noted to be much more awake and alert this morning.  Mentions that he is hungry.  Looking forward to his breakfast.  No nausea vomiting.  No pain at this time.    Assessment/Plan:  Acute stroke/acute metabolic encephalopathy Apparently he was unresponsive and slumped over on his wheelchair at home.  MRI shows possible tiny subacute infarct in the posterior frontal white matter versus artifact.  Neurology is following.  Started on aspirin. EEG does not show any seizure activity.   Echocardiogram shows normal systolic function with grade 1 diastolic dysfunction.  No significant valvular abnormalities  noted. Carotid Doppler is still pending.  Reordered today. LDL is 38.  HbA1c 5.5.   At baseline he needs help with all ADLs.  Seen by PT and OT.  Home health recommended. Patient has significant cognitive dysfunction.  He has a diagnosis of dementia. Seen by palliative care.  They have discussed with his wife.  DNR is confirmed.  No de-escalation of care at this time but no aggressive measures either.    Hypoglycemia in the setting of diabetes mellitus type 2 Likely due to poor oral intake.  He does have diabetes mellitus type 2.  See below.  HbA1c is 5.5. Hopefully since his mentation is better his oral intake will improve.  If not he will need to be placed on D5 infusion.   Prior to admission he was on Tresiba 14 units daily.  In view of HbA1c of 5.5 and low glucose levels we will recommend that insulin be discontinued unless his glucose levels become significantly hyperglycemic after his oral intake improves.  Acute kidney injury Presented with creatinine of 1.99.  His creatinine was 1.12 back in September.   Started on IV fluids.  Renal function is back to baseline now.  Stop IV fluids.    Essential hypertension Amlodipine and metoprolol and olmesartan on hold.   Blood pressures were initially low.  Now reaching hypertensive range.  We will resume his beta-blocker.   Permissive hypertension was allowed but now he is over the acute phase of stroke. Other medicines can be gradually reintroduced.  Urinary tract infection Recent admission for UTI and septic shock noted.  UA is noted to be abnormal.   Started on ceftriaxone.   Interestingly no organism was identified in his urine back in September when he was hospitalized for septic shock from presumed UTI. Urine culture from this admission is still pending.  Recent GI bleed in September 2023 He status post EGD which showed gastric and duodenal ulcers without any active bleeding.  He required 1 unit of transfusion during this admission.   Aspirin was held at that time.  He was asked to take PPI twice a day but looks like he had not been taking it.  PPI initiated here.   Started on aspirin due to acute stroke.  No evidence for bleeding at this time.  Drop in hemoglobin is likely dilutional.  Normocytic anemia Drop in hemoglobin is likely dilutional.  Old labs reviewed.  Looks like his baseline hemoglobin is between 7 and 8.  No evidence for overt bleeding.  Hemoglobin is stable..  Hypokalemia Supplemented.  Magnesium is 1.7 today.  We will give him magnesium sulfate today.  Right tibial fracture He fell in August and broke his leg.  Nonweightbearing.  Has not followed up with orthopedics as he has been hospitalized 2 times now.  He was bedbound even before he fell.  He was supposed to see Dr. Stann Mainland with orthopedics.  This will need to be addressed at discharge.  Dementia At home he is on Namenda and Remeron.  Delirium precautions.  Diabetes mellitus type 2 Recent HbA1c was 7.5.  Continue SSI.  History of glaucoma Continue with eyedrops.  Hypothyroidism Continue levothyroxine.  BPH Continue Flomax.  Hypothermia, resolved Temperature was 96.2 F at initial assessment.  Subsequent temperatures have been normal.  Continue to monitor.  Goals of care Palliative care consulted.  He remains DNR.  Will need palliative care at home at discharge.  Wife appears to be open to consideration of hospice support in the future.  DVT Prophylaxis: SCDs Code Status: DNR Family Communication: Wife was updated yesterday.  We will call her again today. Disposition Plan: Anticipate that he will go back home with home health.  Anticipate discharge in 24 to 48 hours.  Waiting on glucose levels to stabilize and oral intake improved.  Status is: Inpatient Remains inpatient appropriate because: Acute stroke      Medications: Scheduled:   stroke: early stages of recovery book   Does not apply Once   aspirin EC  81 mg Oral Daily    atorvastatin  20 mg Oral QPM   brimonidine  1 drop Both Eyes TID   dorzolamide  1 drop Both Eyes BID   And   timolol  1 drop Both Eyes BID   enoxaparin (LOVENOX) injection  40 mg Subcutaneous Q24H   feeding supplement  237 mL Oral TID BM   latanoprost  1 drop Both Eyes QHS   levothyroxine  88 mcg Oral QAC breakfast   memantine  10 mg Oral BID   mirtazapine  15 mg Oral QHS   Netarsudil Dimesylate  1 drop Both Eyes QHS   pantoprazole  40 mg Oral BID AC   tamsulosin  0.4 mg Oral QPC supper   Continuous:  cefTRIAXone (ROCEPHIN)  IV 2 g (04/03/22 1701)   XBJ:[id number] **OR** acetaminophen (TYLENOL) oral liquid 160 mg/5 mL **OR** acetaminophen, dextrose, senna-docusate  Antibiotics: Anti-infectives (From admission, onward)    Start     Dose/Rate Route Frequency Ordered Stop   04/01/22 1745  cefTRIAXone (ROCEPHIN) 2 g in sodium chloride 0.9 %  100 mL IVPB        2 g 200 mL/hr over 30 Minutes Intravenous Every 24 hours 04/01/22 1744 04/06/22 1744   04/01/22 1530  cefTRIAXone (ROCEPHIN) 2 g in sodium chloride 0.9 % 100 mL IVPB  Status:  Discontinued        2 g 200 mL/hr over 30 Minutes Intravenous Every 24 hours 04/01/22 1520 04/01/22 1743       Objective:  Vital Signs  Vitals:   04/03/22 1900 04/03/22 2351 04/04/22 0404 04/04/22 0759  BP: (!) 152/71 (!) 152/71 (!) 159/79 (!) 172/84  Pulse: 78 80 88 91  Resp: '18 16 16 20  '$ Temp: 98 F (36.7 C) 97.7 F (36.5 C) 97.9 F (36.6 C) 97.7 F (36.5 C)  TempSrc: Axillary Axillary Oral Oral  SpO2: 100% 100% 97% 100%    Intake/Output Summary (Last 24 hours) at 04/04/2022 1047 Last data filed at 04/04/2022 0929 Gross per 24 hour  Intake 1281.29 ml  Output 500 ml  Net 781.29 ml    There were no vitals filed for this visit.  General appearance: Awake alert.  In no distress.  Less distracted today compared to yesterday Resp: Clear to auscultation bilaterally.  Normal effort Cardio: S1-S2 is normal regular.  No S3-S4.  No  rubs murmurs or bruit GI: Abdomen is soft.  Nontender nondistended.  Bowel sounds are present normal.  No masses organomegaly Extremities: Right lower extremity in a brace    Lab Results:  Data Reviewed: I have personally reviewed following labs and reports of the imaging studies  CBC: Recent Labs  Lab 04/01/22 1250 04/01/22 1254 04/03/22 0428 04/04/22 0335  WBC 10.9*  --  6.2 5.6  NEUTROABS 9.1*  --   --   --   HGB 10.6* 12.2* 8.3* 9.0*  HCT 34.0* 36.0* 26.8* 29.1*  MCV 100.3*  --  99.6 100.0  PLT 340  --  255 245     Basic Metabolic Panel: Recent Labs  Lab 04/01/22 1250 04/01/22 1254 04/01/22 2145 04/03/22 0428 04/04/22 0335  NA 137 140  --  141 140  K 4.3 4.6  --  3.2* 3.6  CL 106 105  --  111 114*  CO2 22  --   --  22 18*  GLUCOSE 189* 188*  --  48* 64*  BUN 30* 35*  --  24* 15  CREATININE 1.99* 1.90*  --  1.32* 0.93  CALCIUM 8.7*  --   --  8.1* 8.1*  MG  --   --  2.0  --  1.7     GFR: CrCl cannot be calculated (Unknown ideal weight.).  Liver Function Tests: Recent Labs  Lab 04/01/22 1250  AST 24  ALT 14  ALKPHOS 124  BILITOT 0.8  PROT 7.0  ALBUMIN 2.7*     Recent Labs  Lab 04/01/22 1250  AMMONIA 20     Coagulation Profile: Recent Labs  Lab 04/01/22 1250  INR 1.3*      CBG: Recent Labs  Lab 04/03/22 2053 04/04/22 0007 04/04/22 0424 04/04/22 0459 04/04/22 0802  GLUCAP 99 95 65* 152* 87      Anemia Panel: Recent Labs    04/01/22 2145  VITAMINB12 1,169*     Recent Results (from the past 240 hour(s))  Culture, blood (routine x 2)     Status: None (Preliminary result)   Collection Time: 04/01/22 12:50 PM   Specimen: BLOOD  Result Value Ref Range Status   Specimen Description BLOOD  LEFT ANTECUBITAL  Final   Special Requests   Final    BOTTLES DRAWN AEROBIC AND ANAEROBIC Blood Culture adequate volume   Culture   Final    NO GROWTH 3 DAYS Performed at [facility], [address], [place], [place]  [address]    Report Status PENDING  Incomplete  Resp Panel by RT-PCR (Flu A&B, Covid) Anterior Nasal Swab     Status: None   Collection Time: 04/01/22  3:40 PM   Specimen: Anterior Nasal Swab  Result Value Ref Range Status   SARS Coronavirus 2 by RT PCR NEGATIVE NEGATIVE Final    Comment: (NOTE) SARS-CoV-2 target nucleic acids are NOT DETECTED.  The SARS-CoV-2 RNA is generally detectable in upper respiratory specimens during the acute phase of infection. The lowest concentration of SARS-CoV-2 viral copies this assay can detect is 138 copies/mL. A negative result does not preclude SARS-Cov-2 infection and should not be used as the sole basis for treatment or other patient management decisions. A negative result may occur with  improper specimen collection/handling, submission of specimen other than nasopharyngeal swab, presence of viral mutation(s) within the areas targeted by this assay, and inadequate number of viral copies(<138 copies/mL). A negative result must be combined with clinical observations, patient history, and epidemiological information. The expected result is Negative.  Fact Sheet for Patients:  EntrepreneurPulse.com.au  Fact Sheet for Healthcare Providers:  IncredibleEmployment.be  This test is no t yet approved or cleared by the [place] FDA and  has been authorized for detection and/or diagnosis of SARS-CoV-2 by FDA under an Emergency Use Authorization (EUA). This EUA will remain  in effect (meaning this test can be used) for the duration of the COVID-19 declaration under Section 564(b)(1) of the Act, 21 U.S.C.section 360bbb-3(b)(1), unless the authorization is terminated  or revoked sooner.       Influenza A by PCR NEGATIVE NEGATIVE Final   Influenza B by PCR NEGATIVE NEGATIVE Final    Comment: (NOTE) The Xpert Xpress SARS-CoV-2/FLU/RSV plus assay is intended as an aid in the diagnosis of influenza from Nasopharyngeal  swab specimens and should not be used as a sole basis for treatment. Nasal washings and aspirates are unacceptable for Xpert Xpress SARS-CoV-2/FLU/RSV testing.  Fact Sheet for Patients: EntrepreneurPulse.com.au  Fact Sheet for Healthcare Providers: IncredibleEmployment.be  This test is not yet approved or cleared by the [place] FDA and has been authorized for detection and/or diagnosis of SARS-CoV-2 by FDA under an Emergency Use Authorization (EUA). This EUA will remain in effect (meaning this test can be used) for the duration of the COVID-19 declaration under Section 564(b)(1) of the Act, 21 U.S.C. section 360bbb-3(b)(1), unless the authorization is terminated or revoked.  Performed at [facility], [place] [address] [place], [place] [address]   Urine Culture     Status: None (Preliminary result)   Collection Time: 04/01/22  6:10 PM   Specimen: Urine, Clean Catch  Result Value Ref Range Status   Specimen Description URINE, CLEAN CATCH  Final   Special Requests NONE  Final   Culture   Final    CULTURE REINCUBATED FOR BETTER GROWTH Performed at [facility], [place] [address] [place], [place] [address]    Report Status PENDING  Incomplete  Culture, blood (routine x 2)     Status: None (Preliminary result)   Collection Time: 04/01/22  9:48 PM   Specimen: BLOOD  Result Value Ref Range Status   Specimen Description BLOOD SITE  NOT SPECIFIED  Final   Special Requests   Final    BOTTLES DRAWN AEROBIC ONLY Blood Culture results may not be optimal due to an inadequate volume of blood received in culture bottles   Culture   Final    NO GROWTH 3 DAYS Performed at [facility], [place] [address] [place], [place] [address]    Report Status PENDING  Incomplete      Radiology Studies: ECHOCARDIOGRAM COMPLETE  Result Date: 04/03/2022    ECHOCARDIOGRAM REPORT   Patient Name:   Mike Mack Date of Exam: 04/03/2022 Medical Rec  #:  [id number]      Height:       65.0 in Accession #:    [id number]     Weight:       172.8 lb Date of Birth:  [birth year]     BSA:          1.859 m Patient Age:    22 years       BP:           164/96 mmHg Patient Gender: M              HR:           76 bpm. Exam Location:  Inpatient Procedure: 2D Echo, Cardiac Doppler and Color Doppler Indications:     Stroke I63.9  History:         Patient has prior history of Echocardiogram examinations, most                  recent 10/27/2014. Stroke; Risk Factors:Hypertension and                  Diabetes.  Sonographer:     Bernadene Person RDCS Referring Phys:  [id number] Orma Flaming Diagnosing Phys: Gwyndolyn Kaufman MD IMPRESSIONS  1. Left ventricular ejection fraction, by estimation, is 60 to 65%. The left ventricle has normal function. The left ventricle has no regional wall motion abnormalities. Left ventricular diastolic parameters are consistent with Grade I diastolic dysfunction (impaired relaxation).  2. Right ventricular systolic function is normal. The right ventricular size is normal. There is normal pulmonary artery systolic pressure. The estimated right ventricular systolic pressure is 73.4 mmHg.  3. The mitral valve is normal in structure. Trivial mitral valve regurgitation.  4. The aortic valve is tricuspid. There is mild calcification of the aortic valve. Aortic valve regurgitation is not visualized. Aortic valve sclerosis/calcification is present, without any evidence of aortic stenosis.  5. The inferior vena cava is normal in size with greater than 50% respiratory variability, suggesting right atrial pressure of 3 mmHg. Comparison(s): Compared to prior TTE report in 2016, there is no significant change. Conclusion(s)/Recommendation(s): No intracardiac source of embolism detected on this transthoracic study. Consider a transesophageal echocardiogram to exclude cardiac source of embolism if clinically indicated. FINDINGS  Left Ventricle: Left ventricular ejection  fraction, by estimation, is 60 to 65%. The left ventricle has normal function. The left ventricle has no regional wall motion abnormalities. The left ventricular internal cavity size was normal in size. There is  no left ventricular hypertrophy. Left ventricular diastolic parameters are consistent with Grade I diastolic dysfunction (impaired relaxation). Right Ventricle: The right ventricular size is normal. No increase in right ventricular wall thickness. Right ventricular systolic function is normal. There is normal pulmonary artery systolic pressure. The tricuspid regurgitant velocity is 2.57 m/s, and  with an assumed right atrial pressure of 3 mmHg, the estimated right ventricular systolic  pressure is 29.4 mmHg. Left Atrium: Left atrial size was normal in size. Right Atrium: Right atrial size was normal in size. Pericardium: There is no evidence of pericardial effusion. Mitral Valve: The mitral valve is normal in structure. Trivial mitral valve regurgitation. Tricuspid Valve: The tricuspid valve is normal in structure. Tricuspid valve regurgitation is trivial. Aortic Valve: The aortic valve is tricuspid. There is mild calcification of the aortic valve. Aortic valve regurgitation is not visualized. Aortic valve sclerosis/calcification is present, without any evidence of aortic stenosis. Pulmonic Valve: The pulmonic valve was normal in structure. Pulmonic valve regurgitation is trivial. Aorta: The aortic root is normal in size and structure. Venous: The inferior vena cava is normal in size with greater than 50% respiratory variability, suggesting right atrial pressure of 3 mmHg. IAS/Shunts: The atrial septum is grossly normal.  LEFT VENTRICLE PLAX 2D LVIDd:         3.90 cm     Diastology LVIDs:         2.40 cm     LV e' medial:    6.83 cm/s LV PW:         0.90 cm     LV E/e' medial:  9.0 LV IVS:        1.00 cm     LV e' lateral:   8.32 cm/s LVOT diam:     2.00 cm     LV E/e' lateral: 7.4 LV SV:         56 LV SV  Index:   30 LVOT Area:     3.14 cm  LV Volumes (MOD) LV vol d, MOD A2C: 64.8 ml LV vol d, MOD A4C: 90.5 ml LV vol s, MOD A2C: 24.8 ml LV vol s, MOD A4C: 23.8 ml LV SV MOD A2C:     40.0 ml LV SV MOD A4C:     90.5 ml LV SV MOD BP:      50.5 ml RIGHT VENTRICLE RV S prime:     9.20 cm/s TAPSE (M-mode): 1.9 cm LEFT ATRIUM             Index        RIGHT ATRIUM           Index LA diam:        2.60 cm 1.40 cm/m   RA Area:     10.90 cm LA Vol (A2C):   25.6 ml 13.77 ml/m  RA Volume:   24.30 ml  13.07 ml/m LA Vol (A4C):   22.0 ml 11.83 ml/m LA Biplane Vol: 24.2 ml 13.02 ml/m  AORTIC VALVE LVOT Vmax:   87.00 cm/s LVOT Vmean:  56.600 cm/s LVOT VTI:    0.179 m  AORTA Ao Root diam: 3.30 cm Ao Asc diam:  3.10 cm MITRAL VALVE               TRICUSPID VALVE MV Area (PHT): 3.31 cm    TR Peak grad:   26.4 mmHg MV Decel Time: 229 msec    TR Vmax:        257.00 cm/s MV E velocity: 61.60 cm/s MV A velocity: 66.50 cm/s  SHUNTS MV E/A ratio:  0.93        Systemic VTI:  0.18 m                            Systemic Diam: 2.00 cm Gwyndolyn Kaufman MD Electronically signed by Gwyndolyn Kaufman MD Signature Date/Time: 04/03/2022/3:17:31  PM    Final (Updated)        LOS: 3 days   Kasen Sako [employer] on [email or web address]  04/04/2022, 10:47 AM

## 2022-04-04 NOTE — TOC Progression Note (Signed)
Transition of Care [facility]) - Progression Note    Patient Details  Name: Mike Mack MRN: [id number] Date of Birth: 1935/12/19  [facility] CM/SW Contact  Pollie Friar, RN Phone Number: 04/04/2022, 12:06 PM  Clinical Narrative:    Per [employer] pt doesn't qualify for LALM due to the small size of his stage 3 ulcer. Cm has updated the wife at the bedside.  Wife had expressed interest to palliative care about an electric lift. Her co-pays would be about $125/ month. She has declined this DME at this time.  Plan will be for PTAR transport home when medically ready.  TOC following.   Expected Discharge Plan: [place] Barriers to Discharge: Continued Medical Work up  Expected Discharge Plan and Services Expected Discharge Plan: [place]   Discharge Planning Services: CM Consult Post Acute Care Choice: [place] arrangements for the past 2 months: Single Family Home                 DME Arranged: Cabin crew DME Agency: [employer]       HH Arranged: RN, PT, OT, Nurse's Aide, Social Work [employer] Agency: [place]         Social Determinants of Health (SDOH) Interventions    Readmission Risk Interventions     No data to display

## 2022-04-04 NOTE — Progress Notes (Signed)
Initial Nutrition Assessment  DOCUMENTATION CODES:   Not applicable  INTERVENTION:  - Continue Ensure Enlive po TID, each supplement provides 350 kcal and 20 grams of protein.  - Add Juven BID.   - Add MVI q day.   NUTRITION DIAGNOSIS:   Increased nutrient needs related to wound healing as evidenced by estimated needs.  GOAL:   Patient will meet greater than or equal to 90% of their needs  MONITOR:   PO intake, Supplement acceptance  REASON FOR ASSESSMENT:   Malnutrition Screening Tool    ASSESSMENT:   87 y.o. male admits related to AMS. PMH includes: T2DM, HTN, hx of CVA, CKD stage 3, and Polio.  Meds include: remeron. Labs reviewed.   The pt was sleeping at time of assessment and would not wake to sound of voice or knock on the door. The pt also is oriented x1 and no family present to provide any nutrition-related hx. RD assessed meal tray at bedside. Pt ate about 25% of his lunch. Per record, pt ate 95% of his breakfast. No significant wt loss per record. Pt already has Ensure TID ordered. Pt with increased nutrient needs related to wound healing. RD will also add Juven BID to promote wound healing. Will continue to monitor PO intakes.   NUTRITION - FOCUSED PHYSICAL EXAM:  Will attempt at follow up, if appropriate.   Diet Order:   Diet Order             DIET DYS 2 Room service appropriate? No; Fluid consistency: Thin  Diet effective now                   EDUCATION NEEDS:   Not appropriate for education at this time  Skin:  Skin Assessment: Skin Integrity Issues: Skin Integrity Issues:: Stage III Stage III: Right Buttocks  Last BM:  unknown  Height:   Ht Readings from Last 1 Encounters:  03/04/22 '5\' 5"'$  (1.651 m)    Weight:   Wt Readings from Last 1 Encounters:  03/09/22 78.4 kg    Ideal Body Weight:  61.8 kg  BMI:  There is no height or weight on file to calculate BMI.  Estimated Nutritional Needs:   Kcal:  1960-2350  kcals  Protein:  95-115 gm  Fluid:  1960-2350 mL  Thalia Bloodgood, RD, LDN, CNSC

## 2022-04-05 ENCOUNTER — Inpatient Hospital Stay (HOSPITAL_COMMUNITY): Payer: Medicare HMO

## 2022-04-05 DIAGNOSIS — I639 Cerebral infarction, unspecified: Secondary | ICD-10-CM

## 2022-04-05 DIAGNOSIS — N183 Chronic kidney disease, stage 3 unspecified: Secondary | ICD-10-CM

## 2022-04-05 DIAGNOSIS — T68XXXA Hypothermia, initial encounter: Secondary | ICD-10-CM

## 2022-04-05 DIAGNOSIS — I10 Essential (primary) hypertension: Secondary | ICD-10-CM

## 2022-04-05 DIAGNOSIS — K2971 Gastritis, unspecified, with bleeding: Secondary | ICD-10-CM

## 2022-04-05 DIAGNOSIS — F03C11 Unspecified dementia, severe, with agitation: Secondary | ICD-10-CM | POA: Diagnosis not present

## 2022-04-05 DIAGNOSIS — R410 Disorientation, unspecified: Secondary | ICD-10-CM | POA: Diagnosis not present

## 2022-04-05 LAB — URINE CULTURE: Culture: 70000 — AB

## 2022-04-05 LAB — GLUCOSE, CAPILLARY
Glucose-Capillary: 148 mg/dL — ABNORMAL HIGH (ref 70–99)
Glucose-Capillary: 167 mg/dL — ABNORMAL HIGH (ref 70–99)

## 2022-04-05 LAB — VITAMIN B1: Vitamin B1 (Thiamine): 136.9 nmol/L (ref 66.5–200.0)

## 2022-04-05 MED ORDER — PANTOPRAZOLE SODIUM 40 MG PO TBEC: 40.0000 mg | DELAYED_RELEASE_TABLET | Freq: Every day | ORAL | 2 refills | Status: DC

## 2022-04-05 MED ORDER — ASPIRIN 81 MG PO TBEC: 81.0000 mg | DELAYED_RELEASE_TABLET | Freq: Every day | ORAL | 12 refills | Status: AC

## 2022-04-05 NOTE — Progress Notes (Signed)
This nurse was informed by the phlebotomist that patient has refused labs. The NT stated that the patient has refused vitals and BG again at 0400.   This nurse was able to obtain VS and BG, patient initially recoiled from attempt to place BP cuff. Did not answer to reason refusing, but eventually allowed VS and BG to be obtain. Will attempt lab draws again at a later time.

## 2022-04-05 NOTE — Discharge Summary (Signed)
Physician Discharge Summary  Mike Mack TDD:[id number] DOB: [birth year] DOA: 04/01/2022  PCP: [name], MD  Admit date: 04/01/2022 Discharge date: 04/05/2022  Admitted From: Home Disposition: Home  Recommendations for Outpatient Follow-up:  Follow up with PCP in 1-2 weeks  Home Health: Resume PT OT services Equipment/Devices: No new equipment  Discharge Condition: Stable CODE STATUS: DNR Diet recommendation: As tolerated regular diet  Brief/Interim Summary: 86 y.o. male with medical history significant of T2DM, HTN, hx of CVA, memory loss, CKD stage 3, polio who presented to ED with AMS. His wife gave him his breakfast at 6:30am and she states he was his normal self. At 8am she got him up and dressed and in his WC.  She went to go check on him around 11-11:30 and he was slumped over and not talking to her. Ems was called. At baseline he carries on a normal conversation.  He knows family members, doesn't know date and sometimes confused as to where he is. Needs help with all ADLs. He does feed himself.  Patient was found to have a small stroke in the right hemisphere.  Neurology was consulted.  Patient was hospitalized for further management.  Of note patient was just admitted to [facility] from 9/12-9/21/23 for septic shock secondary to UTI and required stay in ICU on pressors. Also had acute on chronic renal failure and GI bleeding. EGD showed gastric and duodenal ulcers and erosion without active bleeding. S/p 1 unit PRBC.   Patient admitted as above with acute metabolic encephalopathy in the setting of stroke, unclear if this is the main etiology of patient's mental status given concurrent hypoglycemia in setting of diabetes, uncontrolled likely secondary to poor p.o. intake.  Patient's mental status resolved quite quickly with correction of hypoglycemia, now tolerating p.o. much more appropriately and mental status appears to be back to baseline per wife at bedside.  Patient also  appears to be somewhat dehydrated with AKI as below that also resolved with fluids and p.o. intake.  Patient's urine culture was found to be unremarkable despite concerns for possible UTI and intake.  Antibiotics were otherwise discontinued.  See below for other chronic comorbid conditions with patient is otherwise stable and agreeable for discharge home, wife updated at length at bedside all questions were answered and patient will otherwise discharge back home with home health and follow-up outpatient with primary care team as previously scheduled.   Discharge Diagnoses:  Principal Problem:   Altered mental status Active Problems:   acute CVA vs. artifact    Hypothermia   recent GI bleed in 02/2022   Right tibial fracture   CKD (chronic kidney disease) stage 3, GFR 30-59 ml/min (HCC)   Hypertension   BPH (benign prostatic hyperplasia)   Type 2 diabetes mellitus ([place])   Dementia ([place])   Acute stroke/acute metabolic encephalopathy Apparently he was unresponsive and slumped over on his wheelchair at home.  MRI shows possible tiny subacute infarct in the posterior frontal white matter versus artifact.  Start low-dose aspirin  Goals of care  seen by palliative care.  They have discussed with his wife.  DNR is confirmed.  No de-escalation of care at this time but no aggressive measures either.     Hypoglycemia in the setting of diabetes mellitus type 2 Likely due to poor oral intake.  Discontinue all home diabetic medications given poor p.o. intake Can certainly resume home regimen as indicated once glucose is more appropriately controlled   Acute kidney injury Resolved, continue to increase  p.o. intake as tolerated.     Essential hypertension Blood pressure well controlled off home medications, will continue to hold these at discharge   urinary tract infection, prior to admission Recent admission for UTI and septic shock noted.   UA abnormal, cultures showed less than 100,000  colonies of yeast, antibiotics discontinued    Recent GI bleed in September 2023 Started on aspirin due to acute stroke.  No evidence for bleeding at this time.  Drop in hemoglobin is likely dilutional.   Normocytic anemia Somewhat dilutional, at baseline likely chronic anemia of chronic disease exacerbated by recent GI bleed  Hypokalemia Follow labs outpatient  Right tibial fracture He fell in August and broke his leg.  Nonweightbearing.  Has not followed up with orthopedics as he has been hospitalized 2 times now.  He was bedbound even before he fell.  He was supposed to see Dr. Stann Mainland with orthopedics.  This will need to be addressed at discharge in the outpatient setting.   Dementia At home he is on Namenda and Remeron.  Delirium precautions.   Diabetes mellitus type 2  History of glaucoma Continue with eyedrops. Hypothyroidism Continue levothyroxine.   BPH Continue Flomax.  Discharge Instructions  Discharge Instructions     Ambulatory referral to Neurology   Complete by: As directed    Follow up with stroke clinic NP (Jessica Vanschaick or Cecille Rubin, if both not available, consider Zachery Dauer, or Ahern) at [facility] in about 4 weeks. Thanks.   Face-to-face encounter (required for Medicare/Medicaid patients)   Complete by: As directed    I Little Ishikawa certify that this patient is under my care and that I, or a nurse practitioner or physician's assistant working with me, had a face-to-face encounter that meets the physician face-to-face encounter requirements with this patient on 04/05/2022. The encounter with the patient was in whole, or in part for the following medical condition(s) which is the primary reason for home health care (List medical condition): ambulatory dysfunction   The encounter with the patient was in whole, or in part, for the following medical condition, which is the primary reason for home health care: ambulatory dysfunction, hypoglycemia   I  certify that, based on my findings, the following services are medically necessary home health services: Physical therapy   Reason for Medically Necessary Home Health Services: Therapy- Personnel officer, Public librarian   My clinical findings support the need for the above services: Unable to leave home safely without assistance and/or assistive device   Further, I certify that my clinical findings support that this patient is homebound due to: Unable to leave home safely without assistance   Home Health   Complete by: As directed    To provide the following care/treatments:  PT OT        Allergies as of 04/05/2022       Reactions   Metformin And Related Other (See Comments)   Gi intolerance   Soliqua [insulin Glargine-lixisenatide] Other (See Comments)   Stomach cramps        Medication List     STOP taking these medications    amLODipine 5 MG tablet Commonly known as: NORVASC   metoprolol tartrate 25 MG tablet Commonly known as: LOPRESSOR   pioglitazone 30 MG tablet Commonly known as: ACTOS   Tyler Aas FlexTouch 100 UNIT/ML FlexTouch Pen Generic drug: insulin degludec       TAKE these medications    acetaminophen 325 MG tablet Commonly known as:  TYLENOL Take 2 tablets (650 mg total) by mouth every 6 (six) hours as needed for mild pain (or Fever >/= 101).   aspirin EC 81 MG tablet Take 1 tablet (81 mg total) by mouth daily. Swallow whole. Start taking on: April 06, 2022   atorvastatin 20 MG tablet Commonly known as: LIPITOR Take 20 mg by mouth every evening.   B COMPLEX PO Take 1 tablet by mouth at bedtime.   brimonidine 0.2 % ophthalmic solution Commonly known as: ALPHAGAN Place 1 drop into both eyes 3 (three) times daily.   cholecalciferol 10 MCG (400 UNIT) Tabs tablet Commonly known as: VITAMIN D3 Take 400 Units by mouth daily.   docusate sodium 100 MG capsule Commonly known as: COLACE Take 1 capsule (100 mg total) by mouth 2  (two) times daily as needed for mild constipation.   dorzolamide-timolol 2-0.5 % ophthalmic solution Commonly known as: COSOPT Place 1 drop into both eyes 2 (two) times daily.   feeding supplement (GLUCERNA SHAKE) Liqd Take 237 mLs by mouth 3 (three) times daily between meals. What changed: when to take this   Kerendia 20 MG Tabs Generic drug: Finerenone Take 10 mg by mouth daily.   latanoprost 0.005 % ophthalmic solution Commonly known as: XALATAN Place 1 drop into both eyes at bedtime.   memantine 10 MG tablet Commonly known as: NAMENDA Take 10 mg by mouth in the morning and at bedtime.   mirtazapine 15 MG disintegrating tablet Commonly known as: REMERON SOL-TAB Take 1 tablet (15 mg total) by mouth at bedtime.   olmesartan 20 MG tablet Commonly known as: BENICAR Take 20 mg by mouth daily.   pantoprazole 40 MG tablet Commonly known as: PROTONIX Take 1 tablet (40 mg total) by mouth daily.   polyethylene glycol 17 g packet Commonly known as: MIRALAX / GLYCOLAX Take 17 g by mouth daily. What changed:  when to take this reasons to take this   Rhopressa 0.02 % Soln Generic drug: Netarsudil Dimesylate Place 1 drop into both eyes at bedtime.   Synthroid 88 MCG tablet Generic drug: levothyroxine Take 88 mcg by mouth daily before breakfast.   tamsulosin 0.4 MG Caps capsule Commonly known as: FLOMAX Take 0.4 mg by mouth in the morning and at bedtime.               Durable Medical Equipment  (From admission, onward)           Start     Ordered   04/04/22 0956  For home use only DME Specialty mattress  Once       Comments: Low air loss mattress due to a stage 3 wound   04/04/22 0956            Follow-up Information     [facility] Follow up.   Contact information: [phone]        [facility]. Schedule an appointment as soon as possible for a visit in 1 month(s).   Specialty: Neurology Why: stroke  clinic Contact information: [place], [place] Follow up.   Specialty: Home Health Services Why: The home health agency will contact you for the first home visit. Contact information: [address] [place] [address] [place] Follow up.   Specialty: PALLIATIVE CARE Why: The palliative care will contact you  for the first home visit Contact information: [address] [facility] [address] [phone]               Allergies  Allergen Reactions   Metformin And Related Other (See Comments)    Gi intolerance   [name] [Insulin Glargine-Lixisenatide] Other (See Comments)    Stomach cramps    Consultations: Neurology  Procedures/Studies: VAS US CAROTID  Result Date: 04/05/2022 Carotid Arterial Duplex Study Patient Name:  Mike Mack  Date of Exam:   04/05/2022 Medical Rec #: [id number]       Accession #:    [id number] Date of Birth: 1936/05/27      Patient Gender: M Patient Age:   86 years Exam Location:  [facility] Procedure:      VAS US CAROTID Referring Phys: Cornelius Moras XU --------------------------------------------------------------------------------  Indications:       CVA. Risk Factors:      Hypertension, Diabetes. Limitations        Today's exam was limited due to the patient's inability or                    unwillingness to cooperate and Dementia. Comparison Study:  No prior studies. Performing Technologist: Oliver Hum RVT  Examination Guidelines: A complete evaluation includes B-mode imaging, spectral Doppler, color Doppler, and power Doppler as needed of all accessible portions of each vessel. Bilateral testing is considered an integral part of a complete examination. Limited examinations for reoccurring indications may be performed as noted.  Right Carotid Findings:  +----------+--------+--------+--------+-----------------------+--------+           PSV cm/sEDV cm/sStenosisPlaque Description     Comments +----------+--------+--------+--------+-----------------------+--------+ CCA Prox  113     17              smooth and heterogenous         +----------+--------+--------+--------+-----------------------+--------+ CCA Distal105     13              smooth and heterogenous         +----------+--------+--------+--------+-----------------------+--------+ ICA Prox  93      19              smooth and heterogenous         +----------+--------+--------+--------+-----------------------+--------+ ICA Distal97      21                                              +----------+--------+--------+--------+-----------------------+--------+ ECA       172                                                     +----------+--------+--------+--------+-----------------------+--------+ +----------+--------+-------+--------+-------------------+           PSV cm/sEDV cmsDescribeArm Pressure (mmHG) +----------+--------+-------+--------+-------------------+ [id number]                                        +----------+--------+-------+--------+-------------------+ +---------+--------+--+--------+--+---------+ VertebralPSV cm/s72EDV cm/s15Antegrade +---------+--------+--+--------+--+---------+  Left Carotid Findings: +----------+--------+--------+--------+-----------------------+--------+           PSV cm/sEDV cm/sStenosisPlaque Description     Comments +----------+--------+--------+--------+-----------------------+--------+ CCA Prox  128     14  smooth and heterogenous         +----------+--------+--------+--------+-----------------------+--------+ CCA Distal108     12              smooth and heterogenous         +----------+--------+--------+--------+-----------------------+--------+ ICA Prox  105     20               smooth and heterogenous         +----------+--------+--------+--------+-----------------------+--------+ ICA Distal56      13                                     tortuous +----------+--------+--------+--------+-----------------------+--------+ ECA       113     11                                              +----------+--------+--------+--------+-----------------------+--------+ +----------+--------+--------+--------+-------------------+           PSV cm/sEDV cm/sDescribeArm Pressure (mmHG) +----------+--------+--------+--------+-------------------+ Subclavian172                                         +----------+--------+--------+--------+-------------------+ +---------+--------+--+--------+--+---------+ VertebralPSV cm/s59EDV cm/s13Antegrade +---------+--------+--+--------+--+---------+   Summary: Right Carotid: Velocities in the right ICA are consistent with a 1-39% stenosis. Left Carotid: Velocities in the left ICA are consistent with a 1-39% stenosis. Vertebrals: Bilateral vertebral arteries demonstrate antegrade flow. *See table(s) above for measurements and observations.     Preliminary    ECHOCARDIOGRAM COMPLETE  Result Date: 04/03/2022    ECHOCARDIOGRAM REPORT   Patient Name:   Mike Mack Date of Exam: 04/03/2022 Medical Rec #:  [id number]      Height:       65.0 in Accession #:    [id number]     Weight:       172.8 lb Date of Birth:  23-Mar-1936     BSA:          1.859 m Patient Age:    50 years       BP:           164/96 mmHg Patient Gender: M              HR:           76 bpm. Exam Location:  Inpatient Procedure: 2D Echo, Cardiac Doppler and Color Doppler Indications:     Stroke I63.9  History:         Patient has prior history of Echocardiogram examinations, most                  recent 10/27/2014. Stroke; Risk Factors:Hypertension and                  Diabetes.  Sonographer:     Bernadene Person RDCS Referring Phys:  [id number] Orma Flaming Diagnosing Phys:  Gwyndolyn Kaufman MD IMPRESSIONS  1. Left ventricular ejection fraction, by estimation, is 60 to 65%. The left ventricle has normal function. The left ventricle has no regional wall motion abnormalities. Left ventricular diastolic parameters are consistent with Grade I diastolic dysfunction (impaired relaxation).  2. Right ventricular systolic function is normal. The right ventricular size is normal. There is normal pulmonary artery systolic pressure. The estimated right  ventricular systolic pressure is 88.5 mmHg.  3. The mitral valve is normal in structure. Trivial mitral valve regurgitation.  4. The aortic valve is tricuspid. There is mild calcification of the aortic valve. Aortic valve regurgitation is not visualized. Aortic valve sclerosis/calcification is present, without any evidence of aortic stenosis.  5. The inferior vena cava is normal in size with greater than 50% respiratory variability, suggesting right atrial pressure of 3 mmHg. Comparison(s): Compared to prior TTE report in 2016, there is no significant change. Conclusion(s)/Recommendation(s): No intracardiac source of embolism detected on this transthoracic study. Consider a transesophageal echocardiogram to exclude cardiac source of embolism if clinically indicated. FINDINGS  Left Ventricle: Left ventricular ejection fraction, by estimation, is 60 to 65%. The left ventricle has normal function. The left ventricle has no regional wall motion abnormalities. The left ventricular internal cavity size was normal in size. There is  no left ventricular hypertrophy. Left ventricular diastolic parameters are consistent with Grade I diastolic dysfunction (impaired relaxation). Right Ventricle: The right ventricular size is normal. No increase in right ventricular wall thickness. Right ventricular systolic function is normal. There is normal pulmonary artery systolic pressure. The tricuspid regurgitant velocity is 2.57 m/s, and  with an assumed right atrial  pressure of 3 mmHg, the estimated right ventricular systolic pressure is 02.7 mmHg. Left Atrium: Left atrial size was normal in size. Right Atrium: Right atrial size was normal in size. Pericardium: There is no evidence of pericardial effusion. Mitral Valve: The mitral valve is normal in structure. Trivial mitral valve regurgitation. Tricuspid Valve: The tricuspid valve is normal in structure. Tricuspid valve regurgitation is trivial. Aortic Valve: The aortic valve is tricuspid. There is mild calcification of the aortic valve. Aortic valve regurgitation is not visualized. Aortic valve sclerosis/calcification is present, without any evidence of aortic stenosis. Pulmonic Valve: The pulmonic valve was normal in structure. Pulmonic valve regurgitation is trivial. Aorta: The aortic root is normal in size and structure. Venous: The inferior vena cava is normal in size with greater than 50% respiratory variability, suggesting right atrial pressure of 3 mmHg. IAS/Shunts: The atrial septum is grossly normal.  LEFT VENTRICLE PLAX 2D LVIDd:         3.90 cm     Diastology LVIDs:         2.40 cm     LV e' medial:    6.83 cm/s LV PW:         0.90 cm     LV E/e' medial:  9.0 LV IVS:        1.00 cm     LV e' lateral:   8.32 cm/s LVOT diam:     2.00 cm     LV E/e' lateral: 7.4 LV SV:         56 LV SV Index:   30 LVOT Area:     3.14 cm  LV Volumes (MOD) LV vol d, MOD A2C: 64.8 ml LV vol d, MOD A4C: 90.5 ml LV vol s, MOD A2C: 24.8 ml LV vol s, MOD A4C: 23.8 ml LV SV MOD A2C:     40.0 ml LV SV MOD A4C:     90.5 ml LV SV MOD BP:      50.5 ml RIGHT VENTRICLE RV S prime:     9.20 cm/s TAPSE (M-mode): 1.9 cm LEFT ATRIUM             Index        RIGHT ATRIUM  Index LA diam:        2.60 cm 1.40 cm/m   RA Area:     10.90 cm LA Vol (A2C):   25.6 ml 13.77 ml/m  RA Volume:   24.30 ml  13.07 ml/m LA Vol (A4C):   22.0 ml 11.83 ml/m LA Biplane Vol: 24.2 ml 13.02 ml/m  AORTIC VALVE LVOT Vmax:   87.00 cm/s LVOT Vmean:  56.600 cm/s  LVOT VTI:    0.179 m  AORTA Ao Root diam: 3.30 cm Ao Asc diam:  3.10 cm MITRAL VALVE               TRICUSPID VALVE MV Area (PHT): 3.31 cm    TR Peak grad:   26.4 mmHg MV Decel Time: 229 msec    TR Vmax:        257.00 cm/s MV E velocity: 61.60 cm/s MV A velocity: 66.50 cm/s  SHUNTS MV E/A ratio:  0.93        Systemic VTI:  0.18 m                            Systemic Diam: 2.00 cm Gwyndolyn Kaufman MD Electronically signed by Gwyndolyn Kaufman MD Signature Date/Time: 04/03/2022/3:17:31 PM    Final (Updated)    EEG adult  Result Date: 04/02/2022 Lora Havens, MD     04/02/2022  8:16 AM Patient Name: Jobe Mutch MRN: [id number] Epilepsy Attending: Lora Havens Referring Physician/Provider: Lorenza Chick, MD Date: 04/01/2022 Duration: 22.51 mins Patient history: 86yo M with ams. EG to evaluate for seizure Level of alertness: Awake, drowsy AEDs during EEG study: None Technical aspects: This EEG study was done with scalp electrodes positioned according to the 10-20 International system of electrode placement. Electrical activity was reviewed with band pass filter of 1-'70Hz'$ , sensitivity of 7 uV/mm, display speed of 18m/sec with a '60Hz'$  notched filter applied as appropriate. EEG data were recorded continuously and digitally stored.  Video monitoring was available and reviewed as appropriate. Description: The posterior dominant rhythm consists of  8 Hz activity of moderate voltage (25-35 uV) seen predominantly in posterior head regions, symmetric and reactive to eye opening and eye closing. Drowsiness was characterized by attenuation of the posterior background rhythm. EEG showed intermittent generalized 3 to 6 Hz theta-delta slowing. Hyperventilation and photic stimulation were not performed.   ABNORMALITY - Intermittent slow, generalized IMPRESSION: This study is suggestive of mild to moderate diffuse encephalopathy, nonspecific etiology. No seizures or epileptiform discharges were seen throughout the  recording. PLora Havens  MR BRAIN WO CONTRAST  Result Date: 04/01/2022 CLINICAL DATA:  Neuro deficit, acute, stroke suspected EXAM: MRI HEAD WITHOUT CONTRAST MRA HEAD WITHOUT CONTRAST TECHNIQUE: Multiplanar, multi-echo pulse sequences of the brain and surrounding structures were acquired without intravenous contrast. Angiographic images of the Circle of Willis were acquired using MRA technique without intravenous contrast. COMPARISON:  MRI head November 25, 2020.  MRA head April 10, 2012. FINDINGS: MR HEAD FINDINGS Brain: Punctate focus of DWI hyperintensity in the right posterior frontal white matter (series 2, image 48) without definite ADC correlate. Similar ventriculomegaly. No evidence of mass lesion, midline shift or acute hemorrhage. Remote infarcts in the cerebellum bilaterally. Scattered T2/FLAIR hyperintensities in the white matter, compatible with chronic microvascular ischemic disease. Vascular: See below. Skull and upper cervical spine: Normal marrow signal. Sinuses/Orbits: No acute findings. Other: Bilateral mastoid effusions. MRA HEAD FINDINGS Significantly motion limited study.  Within this limitation:  Anterior circulation: Bilateral intracranial ICAs, proximal MCAs and proximal ACAs are patent. Posterior circulation: Bilateral intradural vertebral arteries, basilar artery and proximal posterior cerebral arteries are patent. IMPRESSION: MRI head: 1. Punctate focus of DWI hyperintensity in the right posterior frontal white matter without definite ADC correlate. This could represent a tiny subacute infarct versus artifact. 2. Similar ventriculomegaly, mildly disc portion it to sulcal enlargement. This could represent central predominant atrophy or normal pressure hydrocephalus. 3. Chronic microvascular ischemic disease and bilateral remote cerebellar infarcts. 4. Bilateral mastoid effusions. MRA: Significantly motion limited study without evidence of proximal large vessel occlusion.  Electronically Signed   By: Margaretha Sheffield M.D.   On: 04/01/2022 14:59   MR ANGIO HEAD WO CONTRAST  Result Date: 04/01/2022 CLINICAL DATA:  Neuro deficit, acute, stroke suspected EXAM: MRI HEAD WITHOUT CONTRAST MRA HEAD WITHOUT CONTRAST TECHNIQUE: Multiplanar, multi-echo pulse sequences of the brain and surrounding structures were acquired without intravenous contrast. Angiographic images of the Circle of Willis were acquired using MRA technique without intravenous contrast. COMPARISON:  MRI head November 25, 2020.  MRA head April 10, 2012. FINDINGS: MR HEAD FINDINGS Brain: Punctate focus of DWI hyperintensity in the right posterior frontal white matter (series 2, image 48) without definite ADC correlate. Similar ventriculomegaly. No evidence of mass lesion, midline shift or acute hemorrhage. Remote infarcts in the cerebellum bilaterally. Scattered T2/FLAIR hyperintensities in the white matter, compatible with chronic microvascular ischemic disease. Vascular: See below. Skull and upper cervical spine: Normal marrow signal. Sinuses/Orbits: No acute findings. Other: Bilateral mastoid effusions. MRA HEAD FINDINGS Significantly motion limited study.  Within this limitation: Anterior circulation: Bilateral intracranial ICAs, proximal MCAs and proximal ACAs are patent. Posterior circulation: Bilateral intradural vertebral arteries, basilar artery and proximal posterior cerebral arteries are patent. IMPRESSION: MRI head: 1. Punctate focus of DWI hyperintensity in the right posterior frontal white matter without definite ADC correlate. This could represent a tiny subacute infarct versus artifact. 2. Similar ventriculomegaly, mildly disc portion it to sulcal enlargement. This could represent central predominant atrophy or normal pressure hydrocephalus. 3. Chronic microvascular ischemic disease and bilateral remote cerebellar infarcts. 4. Bilateral mastoid effusions. MRA: Significantly motion limited study without  evidence of proximal large vessel occlusion. Electronically Signed   By: Margaretha Sheffield M.D.   On: 04/01/2022 14:59   DG Chest 2 View  Result Date: 04/01/2022 CLINICAL DATA:  Altered mental status EXAM: CHEST - 2 VIEW COMPARISON:  02/28/2022 FINDINGS: Previously seen central line has been removed. Stable heart size. Low lung volumes. Trace bilateral pleural effusions, more evident on the left. No airspace consolidation. No pneumothorax. IMPRESSION: Trace bilateral pleural effusions, more evident on the left. Electronically Signed   By: Davina Poke D.O.   On: 04/01/2022 14:03   CT HEAD CODE STROKE WO CONTRAST  Result Date: 04/01/2022 CLINICAL DATA:  Code stroke. EXAM: CT HEAD WITHOUT CONTRAST TECHNIQUE: Contiguous axial images were obtained from the base of the skull through the vertex without intravenous contrast. RADIATION DOSE REDUCTION: This exam was performed according to the departmental dose-optimization program which includes automated exposure control, adjustment of the mA and/or kV according to patient size and/or use of iterative reconstruction technique. COMPARISON:  03/08/2022 FINDINGS: Brain: No evidence of acute infarction, hemorrhage, cerebral edema, mass, mass effect, or midline shift. No hydrocephalus or extra-axial collection. Diffuse ventricular enlargement, unchanged. Periventricular white matter changes, likely the sequela of chronic small vessel ischemic disease. Vascular: No hyperdense vessel. Skull: Negative for fracture or focal lesion. Sinuses/Orbits: No acute finding. Status post  bilateral lens replacements. Other: Fluid in the right-greater-than-left mastoid air cells. ASPECTS [facility] Stroke Program Early CT Score) - Ganglionic level infarction (caudate, lentiform nuclei, internal capsule, insula, M1-M3 cortex): 7 - Supraganglionic infarction (M4-M6 cortex): 3 Total score (0-10 with 10 being normal): 10 IMPRESSION: 1. No acute intracranial process. 2. ASPECTS is 10.  Code stroke imaging results were communicated on 04/01/2022 at 1:12 pm to provider BHAGAT via secure text paging. Electronically Signed   By: Merilyn Baba M.D.   On: 04/01/2022 13:12     Subjective: No acute issues or events overnight   Discharge Exam: Vitals:   04/05/22 0808 04/05/22 1113  BP: (!) 165/74 (!) 143/88  Pulse: 95 88  Resp: 14 14  Temp: 98 F (36.7 C) 98.6 F (37 C)  SpO2: 100% 100%   Vitals:   04/04/22 1506 04/05/22 0440 04/05/22 0808 04/05/22 1113  BP: (!) 148/86 (!) 160/79 (!) 165/74 (!) 143/88  Pulse: 100  95 88  Resp: '18 16 14 14  '$ Temp: 97.9 F (36.6 C) 98.3 F (36.8 C) 98 F (36.7 C) 98.6 F (37 C)  TempSrc: Axillary Oral Axillary Oral  SpO2: 99% 99% 100% 100%    General: Pt is alert, awake, not in acute distress Cardiovascular: RRR, S1/S2 +, no rubs, no gallops Respiratory: CTA bilaterally, no wheezing, no rhonchi Abdominal: Soft, NT, ND, bowel sounds + Extremities: no edema, no cyanosis    The results of significant diagnostics from this hospitalization (including imaging, microbiology, ancillary and laboratory) are listed below for reference.     Microbiology: Recent Results (from the past 240 hour(s))  Culture, blood (routine x 2)     Status: None (Preliminary result)   Collection Time: 04/01/22 12:50 PM   Specimen: BLOOD  Result Value Ref Range Status   Specimen Description BLOOD LEFT ANTECUBITAL  Final   Special Requests   Final    BOTTLES DRAWN AEROBIC AND ANAEROBIC Blood Culture adequate volume   Culture   Final    NO GROWTH 4 DAYS Performed at [facility], [address] [place], [place] [address]    Report Status PENDING  Incomplete  Resp Panel by RT-PCR (Flu A&B, Covid) Anterior Nasal Swab     Status: None   Collection Time: 04/01/22  3:40 PM   Specimen: Anterior Nasal Swab  Result Value Ref Range Status   SARS Coronavirus 2 by RT PCR NEGATIVE NEGATIVE Final    Comment: (NOTE) SARS-CoV-2 target nucleic acids are NOT  DETECTED.  The SARS-CoV-2 RNA is generally detectable in upper respiratory specimens during the acute phase of infection. The lowest concentration of SARS-CoV-2 viral copies this assay can detect is 138 copies/mL. A negative result does not preclude SARS-Cov-2 infection and should not be used as the sole basis for treatment or other patient management decisions. A negative result may occur with  improper specimen collection/handling, submission of specimen other than nasopharyngeal swab, presence of viral mutation(s) within the areas targeted by this assay, and inadequate number of viral copies(<138 copies/mL). A negative result must be combined with clinical observations, patient history, and epidemiological information. The expected result is Negative.  Fact Sheet for Patients:  EntrepreneurPulse.com.au  Fact Sheet for Healthcare Providers:  IncredibleEmployment.be  This test is no t yet approved or cleared by the [place] FDA and  has been authorized for detection and/or diagnosis of SARS-CoV-2 by FDA under an Emergency Use Authorization (EUA). This EUA will remain  in effect (meaning this test can be  used) for the duration of the COVID-19 declaration under Section 564(b)(1) of the Act, 21 U.S.C.section 360bbb-3(b)(1), unless the authorization is terminated  or revoked sooner.       Influenza A by PCR NEGATIVE NEGATIVE Final   Influenza B by PCR NEGATIVE NEGATIVE Final    Comment: (NOTE) The Xpert Xpress SARS-CoV-2/FLU/RSV plus assay is intended as an aid in the diagnosis of influenza from Nasopharyngeal swab specimens and should not be used as a sole basis for treatment. Nasal washings and aspirates are unacceptable for Xpert Xpress SARS-CoV-2/FLU/RSV testing.  Fact Sheet for Patients: EntrepreneurPulse.com.au  Fact Sheet for Healthcare Providers: IncredibleEmployment.be  This test is not yet  approved or cleared by the [place] FDA and has been authorized for detection and/or diagnosis of SARS-CoV-2 by FDA under an Emergency Use Authorization (EUA). This EUA will remain in effect (meaning this test can be used) for the duration of the COVID-19 declaration under Section 564(b)(1) of the Act, 21 U.S.C. section 360bbb-3(b)(1), unless the authorization is terminated or revoked.  Performed at [facility], [place] [address], [place], [place] [address]   Urine Culture     Status: Abnormal (Preliminary result)   Collection Time: 04/01/22  6:10 PM   Specimen: Urine, Clean Catch  Result Value Ref Range Status   Specimen Description URINE, CLEAN CATCH  Final   Special Requests NONE  Final   Culture (A)  Final    70,000 COLONIES/mL YEAST IDENTIFICATION TO FOLLOW Performed at [facility], [place] [address], [place], [place] [address]    Report Status PENDING  Incomplete  Culture, blood (routine x 2)     Status: None (Preliminary result)   Collection Time: 04/01/22  9:48 PM   Specimen: BLOOD  Result Value Ref Range Status   Specimen Description BLOOD SITE NOT SPECIFIED  Final   Special Requests   Final    BOTTLES DRAWN AEROBIC ONLY Blood Culture results may not be optimal due to an inadequate volume of blood received in culture bottles   Culture   Final    NO GROWTH 4 DAYS Performed at [facility], [place] [address], [place], [place] [address]    Report Status PENDING  Incomplete     Labs: BNP (last 3 results) No results for input(s): "BNP" in the last 8760 hours. Basic Metabolic Panel: Recent Labs  Lab 04/01/22 1250 04/01/22 1254 04/01/22 2145 04/03/22 0428 04/04/22 0335  NA 137 140  --  141 140  K 4.3 4.6  --  3.2* 3.6  CL 106 105  --  111 114*  CO2 22  --   --  22 18*  GLUCOSE 189* 188*  --  48* 64*  BUN 30* 35*  --  24* 15  CREATININE 1.99* 1.90*  --  1.32* 0.93  CALCIUM 8.7*  --   --  8.1* 8.1*  MG  --   --  2.0  --  1.7   Liver Function  Tests: Recent Labs  Lab 04/01/22 1250  AST 24  ALT 14  ALKPHOS 124  BILITOT 0.8  PROT 7.0  ALBUMIN 2.7*   No results for input(s): "LIPASE", "AMYLASE" in the last 168 hours. Recent Labs  Lab 04/01/22 1250  AMMONIA 20   CBC: Recent Labs  Lab 04/01/22 1250 04/01/22 1254 04/03/22 0428 04/04/22 0335  WBC 10.9*  --  6.2 5.6  NEUTROABS 9.1*  --   --   --   HGB 10.6* 12.2* 8.3*  9.0*  HCT 34.0* 36.0* 26.8* 29.1*  MCV 100.3*  --  99.6 100.0  PLT 340  --  255 245   Cardiac Enzymes: No results for input(s): "CKTOTAL", "CKMB", "CKMBINDEX", "TROPONINI" in the last 168 hours. BNP: Invalid input(s): "POCBNP" CBG: Recent Labs  Lab 04/04/22 0459 04/04/22 0802 04/04/22 1201 04/05/22 0813 04/05/22 1115  GLUCAP 152* 87 126* 148* 167*   D-Dimer No results for input(s): "DDIMER" in the last 72 hours. Hgb A1c No results for input(s): "HGBA1C" in the last 72 hours. Lipid Profile No results for input(s): "CHOL", "HDL", "LDLCALC", "TRIG", "CHOLHDL", "LDLDIRECT" in the last 72 hours. Thyroid function studies No results for input(s): "TSH", "T4TOTAL", "T3FREE", "THYROIDAB" in the last 72 hours.  Invalid input(s): "FREET3" Anemia work up No results for input(s): "VITAMINB12", "FOLATE", "FERRITIN", "TIBC", "IRON", "RETICCTPCT" in the last 72 hours. Urinalysis    Component Value Date/Time   COLORURINE AMBER (A) 04/01/2022 1639   APPEARANCEUR TURBID (A) 04/01/2022 1639   LABSPEC 1.023 04/01/2022 1639   PHURINE 5.0 04/01/2022 1639   GLUCOSEU NEGATIVE 04/01/2022 1639   HGBUR LARGE (A) 04/01/2022 1639   BILIRUBINUR NEGATIVE 04/01/2022 1639   KETONESUR 5 (A) 04/01/2022 1639   PROTEINUR 100 (A) 04/01/2022 1639   UROBILINOGEN 0.2 10/25/2014 1520   NITRITE NEGATIVE 04/01/2022 1639   LEUKOCYTESUR MODERATE (A) 04/01/2022 1639   Sepsis Labs Recent Labs  Lab 04/01/22 1250 04/03/22 0428 04/04/22 0335  WBC 10.9* 6.2 5.6   Microbiology Recent Results (from the past 240 hour(s))   Culture, blood (routine x 2)     Status: None (Preliminary result)   Collection Time: 04/01/22 12:50 PM   Specimen: BLOOD  Result Value Ref Range Status   Specimen Description BLOOD LEFT ANTECUBITAL  Final   Special Requests   Final    BOTTLES DRAWN AEROBIC AND ANAEROBIC Blood Culture adequate volume   Culture   Final    NO GROWTH 4 DAYS Performed at [facility], [place] [address] [place], [place] [address]    Report Status PENDING  Incomplete  Resp Panel by RT-PCR (Flu A&B, Covid) Anterior Nasal Swab     Status: None   Collection Time: 04/01/22  3:40 PM   Specimen: Anterior Nasal Swab  Result Value Ref Range Status   SARS Coronavirus 2 by RT PCR NEGATIVE NEGATIVE Final    Comment: (NOTE) SARS-CoV-2 target nucleic acids are NOT DETECTED.  The SARS-CoV-2 RNA is generally detectable in upper respiratory specimens during the acute phase of infection. The lowest concentration of SARS-CoV-2 viral copies this assay can detect is 138 copies/mL. A negative result does not preclude SARS-Cov-2 infection and should not be used as the sole basis for treatment or other patient management decisions. A negative result may occur with  improper specimen collection/handling, submission of specimen other than nasopharyngeal swab, presence of viral mutation(s) within the areas targeted by this assay, and inadequate number of viral copies(<138 copies/mL). A negative result must be combined with clinical observations, patient history, and epidemiological information. The expected result is Negative.  Fact Sheet for Patients:  EntrepreneurPulse.com.au  Fact Sheet for Healthcare Providers:  IncredibleEmployment.be  This test is no t yet approved or cleared by the [place] FDA and  has been authorized for detection and/or diagnosis of SARS-CoV-2 by FDA under an Emergency Use Authorization (EUA). This EUA will remain  in effect (meaning this test can be  used) for the duration of the COVID-19 declaration under Section 564(b)(1) of the Act, 21  U.S.C.section 360bbb-3(b)(1), unless the authorization is terminated  or revoked sooner.       Influenza A by PCR NEGATIVE NEGATIVE Final   Influenza B by PCR NEGATIVE NEGATIVE Final    Comment: (NOTE) The Xpert Xpress SARS-CoV-2/FLU/RSV plus assay is intended as an aid in the diagnosis of influenza from Nasopharyngeal swab specimens and should not be used as a sole basis for treatment. Nasal washings and aspirates are unacceptable for Xpert Xpress SARS-CoV-2/FLU/RSV testing.  Fact Sheet for Patients: EntrepreneurPulse.com.au  Fact Sheet for Healthcare Providers: IncredibleEmployment.be  This test is not yet approved or cleared by the [place] FDA and has been authorized for detection and/or diagnosis of SARS-CoV-2 by FDA under an Emergency Use Authorization (EUA). This EUA will remain in effect (meaning this test can be used) for the duration of the COVID-19 declaration under Section 564(b)(1) of the Act, 21 U.S.C. section 360bbb-3(b)(1), unless the authorization is terminated or revoked.  Performed at [facility], [place] [address], [place], [place] [address]   Urine Culture     Status: Abnormal (Preliminary result)   Collection Time: 04/01/22  6:10 PM   Specimen: Urine, Clean Catch  Result Value Ref Range Status   Specimen Description URINE, CLEAN CATCH  Final   Special Requests NONE  Final   Culture (A)  Final    70,000 COLONIES/mL YEAST IDENTIFICATION TO FOLLOW Performed at [facility], [place] [address], [place], [place] [address]    Report Status PENDING  Incomplete  Culture, blood (routine x 2)     Status: None (Preliminary result)   Collection Time: 04/01/22  9:48 PM   Specimen: BLOOD  Result Value Ref Range Status   Specimen Description BLOOD SITE NOT SPECIFIED  Final   Special Requests   Final    BOTTLES DRAWN AEROBIC  ONLY Blood Culture results may not be optimal due to an inadequate volume of blood received in culture bottles   Culture   Final    NO GROWTH 4 DAYS Performed at [facility], [place] [address] [place], [place] [address]    Report Status PENDING  Incomplete     Time coordinating discharge: Over 30 minutes  SIGNED:   Little Ishikawa, DO [facility] 04/05/2022, 12:42 PM Pager   If 7PM-7AM, please contact night-coverage [email or web address]

## 2022-04-05 NOTE — Progress Notes (Signed)
Carotid artery duplex has been completed. Preliminary results can be found in CV Proc through chart review.   04/05/22 9:18 AM Carlos Levering RVT

## 2022-04-05 NOTE — Care Management Important Message (Signed)
Important Message  Patient Details  Name: Mike Mack MRN: [id number] Date of Birth: 1936/02/27   Medicare Important Message Given:  Yes     [name] 04/05/2022, 2:08 PM

## 2022-04-05 NOTE — TOC Transition Note (Signed)
Transition of Care [facility]) - CM/SW Discharge Note   Patient Details  Name: Mike Mack MRN: [id number] Date of Birth: July 22, 1935  [facility] CM/SW Contact:  Pollie Friar, RN Phone Number: 04/05/2022, 12:18 PM   Clinical Narrative:    Pt is discharging home with resumption of [facility] services through [facility]. Kelly with [facility] aware of d/c.  No DME. Palliative care arranged through [facility]. Information on the AVS.  Pt transporting home via [facility]. Address confirmed with spouse. Bedside RN updated.    Final next level of care: Home w Home Health Services Barriers to Discharge: No Barriers Identified   Patient Goals and CMS Choice   CMS Medicare.gov Compare Post Acute Care list provided to:: Patient Represenative (must comment) Choice offered to / list presented to : Spouse  Discharge Placement                       Discharge Plan and Services   Discharge Planning Services: CM Consult Post Acute Care Choice: Home Health          DME Arranged: Air overlay mattress DME Agency: [employer]       HH Arranged: RN, PT, OT, Nurse's Aide, Social Work [employer] Agency: [place] Date [place]: 04/05/22   Representative spoke with at [place]: [place] ([place]) Interventions     Readmission Risk Interventions     No data to display

## 2022-04-06 LAB — CULTURE, BLOOD (ROUTINE X 2)
Culture: NO GROWTH
Culture: NO GROWTH
Special Requests: ADEQUATE

## 2022-04-07 ENCOUNTER — Telehealth: Payer: Self-pay

## 2022-04-08 LAB — GLUCOSE, CAPILLARY: Glucose-Capillary: 152 mg/dL — ABNORMAL HIGH (ref 70–99)

## 2022-04-13 DIAGNOSIS — N390 Urinary tract infection, site not specified: Secondary | ICD-10-CM | POA: Diagnosis not present

## 2022-04-13 DIAGNOSIS — I69351 Hemiplegia and hemiparesis following cerebral infarction affecting right dominant side: Secondary | ICD-10-CM | POA: Diagnosis not present

## 2022-04-13 DIAGNOSIS — I129 Hypertensive chronic kidney disease with stage 1 through stage 4 chronic kidney disease, or unspecified chronic kidney disease: Secondary | ICD-10-CM | POA: Diagnosis not present

## 2022-04-13 DIAGNOSIS — E1122 Type 2 diabetes mellitus with diabetic chronic kidney disease: Secondary | ICD-10-CM | POA: Diagnosis not present

## 2022-04-14 DIAGNOSIS — L89313 Pressure ulcer of right buttock, stage 3: Secondary | ICD-10-CM | POA: Diagnosis not present

## 2022-04-14 DIAGNOSIS — L97413 Non-pressure chronic ulcer of right heel and midfoot with necrosis of muscle: Secondary | ICD-10-CM | POA: Diagnosis not present

## 2022-04-14 DIAGNOSIS — L97312 Non-pressure chronic ulcer of right ankle with fat layer exposed: Secondary | ICD-10-CM | POA: Diagnosis not present

## 2022-04-14 DIAGNOSIS — L97322 Non-pressure chronic ulcer of left ankle with fat layer exposed: Secondary | ICD-10-CM | POA: Diagnosis not present

## 2022-04-14 DIAGNOSIS — L97425 Non-pressure chronic ulcer of left heel and midfoot with muscle involvement without evidence of necrosis: Secondary | ICD-10-CM | POA: Diagnosis not present

## 2022-04-17 DIAGNOSIS — G301 Alzheimer's disease with late onset: Secondary | ICD-10-CM | POA: Diagnosis not present

## 2022-04-17 DIAGNOSIS — N1831 Chronic kidney disease, stage 3a: Secondary | ICD-10-CM | POA: Diagnosis not present

## 2022-04-17 DIAGNOSIS — Z794 Long term (current) use of insulin: Secondary | ICD-10-CM | POA: Diagnosis not present

## 2022-04-17 DIAGNOSIS — R410 Disorientation, unspecified: Secondary | ICD-10-CM | POA: Diagnosis not present

## 2022-04-17 DIAGNOSIS — I10 Essential (primary) hypertension: Secondary | ICD-10-CM | POA: Diagnosis not present

## 2022-04-17 DIAGNOSIS — E1142 Type 2 diabetes mellitus with diabetic polyneuropathy: Secondary | ICD-10-CM | POA: Diagnosis not present

## 2022-04-17 DIAGNOSIS — Z09 Encounter for follow-up examination after completed treatment for conditions other than malignant neoplasm: Secondary | ICD-10-CM | POA: Diagnosis not present

## 2022-04-18 DIAGNOSIS — S82101A Unspecified fracture of upper end of right tibia, initial encounter for closed fracture: Secondary | ICD-10-CM | POA: Diagnosis not present

## 2022-05-01 DIAGNOSIS — L89313 Pressure ulcer of right buttock, stage 3: Secondary | ICD-10-CM | POA: Diagnosis not present

## 2022-05-01 DIAGNOSIS — N390 Urinary tract infection, site not specified: Secondary | ICD-10-CM | POA: Diagnosis not present

## 2022-05-01 DIAGNOSIS — D631 Anemia in chronic kidney disease: Secondary | ICD-10-CM | POA: Diagnosis not present

## 2022-05-01 DIAGNOSIS — G9341 Metabolic encephalopathy: Secondary | ICD-10-CM | POA: Diagnosis not present

## 2022-05-01 DIAGNOSIS — N183 Chronic kidney disease, stage 3 unspecified: Secondary | ICD-10-CM | POA: Diagnosis not present

## 2022-05-01 DIAGNOSIS — N179 Acute kidney failure, unspecified: Secondary | ICD-10-CM | POA: Diagnosis not present

## 2022-05-01 DIAGNOSIS — E1140 Type 2 diabetes mellitus with diabetic neuropathy, unspecified: Secondary | ICD-10-CM | POA: Diagnosis not present

## 2022-05-01 DIAGNOSIS — E1122 Type 2 diabetes mellitus with diabetic chronic kidney disease: Secondary | ICD-10-CM | POA: Diagnosis not present

## 2022-05-01 DIAGNOSIS — I129 Hypertensive chronic kidney disease with stage 1 through stage 4 chronic kidney disease, or unspecified chronic kidney disease: Secondary | ICD-10-CM | POA: Diagnosis not present

## 2022-05-08 NOTE — Progress Notes (Unsigned)
[facility] [address] [place] [place] [address] [phone]       [place]  [name] Date of Birth:  1935-10-14 Medical Record Number:  [id number]   Reason for Referral:  hospital stroke follow up    SUBJECTIVE:   CHIEF COMPLAINT:  No chief complaint on file.   HPI:   Mike Mack is a 86 y.o. who  has a past medical history of Anemia (05/13/2013), Arthritis, BPH (benign prostatic hyperplasia), Constipation (05/13/2013), Exertional shortness of breath, History of kidney stones, History of stomach ulcers, Hypertension, Neuropathy, Polio, Polio (Childhood), Small bowel obstruction ([place]), Stroke ([place]) (2014), and Type II diabetes mellitus ([place]).  Patient presented on 04/01/2022 after being found slumped over in wheelchair by wife around 11am. Wheelchair bound at baseline. He has extensive medical history with memory loss but is usually able to carry on conversation. CT was normal. MRI showed punctate focus of DWI hyperintensity in right posterior frontal white matter without definite ADC correlate. Possible tiny subacute infarct versus artifact. He was treated for concerns of urosepsis (UA WBC >50, hypotenison and hypothermia). Personally reviewed hospitalization pertinent progress notes, lab work and imaging.  Evaluated by Dr Erlinda Hong.   Since discharge,   [place] orders were placed in hospital. Palliative care?    PERTINENT IMAGING/LABS  Code Stroke CT head No acute abnormality. ASPECTS 10.    MRI  Punctate focus of DWI hyperintensity in the right posterior frontal white matter without definite ADC correlate. This could represent a tiny subacute infarct versus artifact. Chronic microvascular ischemic disease and bilateral remote cerebellar infarcts. MRA head Significantly motion limited study without evidence of proximal large vessel occlusion. VAS [place] - pt not cooperative, will cancel, not change management.  2D Echo EF 60 to  65%   A1C Lab Results  Component Value Date   HGBA1C 5.5 04/01/2022    Lipid Panel     Component Value Date/Time   CHOL 94 04/01/2022 2145   TRIG 59 04/01/2022 2145   HDL 44 04/01/2022 2145   CHOLHDL 2.1 04/01/2022 2145   VLDL 12 04/01/2022 2145   [place] 38 04/01/2022 2145      ROS:   14 system review of systems performed and negative with exception of those listed in HPI  PMH:  Past Medical History:  Diagnosis Date   Anemia 05/13/2013   Arthritis    "right leg" (07/25/2013)   BPH (benign prostatic hyperplasia)    Constipation 05/13/2013   Exertional shortness of breath    "sometimes" (07/25/2013)   History of kidney stones    History of stomach ulcers    Hypertension    Neuropathy    Polio    Polio Childhood   Small bowel obstruction ([place])    Stroke ([place]) 2014   residual:  "left hand kind of numb" (07/25/2013)   Type II diabetes mellitus ([place])     PSH:  Past Surgical History:  Procedure Laterality Date   BIOPSY  11/30/2020   Procedure: BIOPSY;  Surgeon: Ronnette Juniper, MD;  Location: [facility];  Service: Gastroenterology;;   BIOPSY  03/03/2022   Procedure: BIOPSY;  Surgeon: Clarene Essex, MD;  Location: [facility];  Service: Gastroenterology;;   BOWEL RESECTION     CATARACT EXTRACTION W/ INTRAOCULAR LENS  IMPLANT, BILATERAL Bilateral 2000's   CHOLECYSTECTOMY N/A 07/25/2013   Procedure: LAPAROSCOPIC CHOLECYSTECTOMY WITH INTRAOPERATIVE CHOLANGIOGRAM;  Surgeon: [name], MD;  Location: [facility];  Service: General;  Laterality: N/A;   COLON  SURGERY     COLONOSCOPY     Hx: of   CYSTOSCOPY WITH BIOPSY N/A 12/01/2019   Procedure: CYSTOSCOPY WITH BLADDER BIOPSY AND FULGURATION;  Surgeon: Lucas Mallow, MD;  Location: [facility];  Service: Urology;  Laterality: N/A;   ESOPHAGOGASTRODUODENOSCOPY N/A 11/30/2020   Procedure: ESOPHAGOGASTRODUODENOSCOPY (EGD);  Surgeon: Ronnette Juniper, MD;  Location: [facility];  Service: Gastroenterology;  Laterality: N/A;    ESOPHAGOGASTRODUODENOSCOPY N/A 03/03/2022   Procedure: ESOPHAGOGASTRODUODENOSCOPY (EGD);  Surgeon: Clarene Essex, MD;  Location: [facility];  Service: Gastroenterology;  Laterality: N/A;   EXCISIONAL HEMORRHOIDECTOMY  2000's   EXTRACORPOREAL SHOCK WAVE LITHOTRIPSY Right 10/03/2018   Procedure: EXTRACORPOREAL SHOCK WAVE LITHOTRIPSY (ESWL);  Surgeon: Festus Aloe, MD;  Location: [facility];  Service: Urology;  Laterality: Right;   EYE SURGERY     INGUINAL HERNIA REPAIR Right 1970's   IR RADIOLOGIST EVAL & MGMT  04/06/2020   LAPAROSCOPIC CHOLECYSTECTOMY  07/25/2013   w/LOA (07/25/2013)    Social History:  Social History   Socioeconomic History   Marital status: Married    Spouse name: Olvin Rohr   Number of children: 2   Years of education: Not on file   Highest education level: Not on file  Occupational History   Occupation: Retired, Curator  Tobacco Use   Smoking status: Never   Smokeless tobacco: Never  Vaping Use   Vaping Use: Never used  Substance and Sexual Activity   Alcohol use: No   Drug use: No   Sexual activity: Yes  Other Topics Concern   Not on file  Social History Narrative   Married.  Lives in [place] with wife.  Ambulates with a cane.   Social Determinants of Health   Financial Resource Strain: Not on file  Food Insecurity: Not on file  Transportation Needs: Not on file  Physical Activity: Not on file  Stress: Not on file  Social Connections: Not on file  Intimate Partner Violence: Not on file    Family History:  Family History  Problem Relation Age of Onset   Heart failure Mother    Hypertension Sister    Diabetes Sister    Diabetes Brother    Hypertension Brother     Medications:   Current Outpatient Medications on File Prior to Visit  Medication Sig Dispense Refill   acetaminophen (TYLENOL) 325 MG tablet Take 2 tablets (650 mg total) by mouth every 6 (six) hours as needed for mild pain (or Fever >/= 101).     aspirin EC 81 MG tablet  Take 1 tablet (81 mg total) by mouth daily. Swallow whole. 30 tablet 12   atorvastatin (LIPITOR) 20 MG tablet Take 20 mg by mouth every evening.      B Complex Vitamins (B COMPLEX PO) Take 1 tablet by mouth at bedtime.     brimonidine (ALPHAGAN) 0.2 % ophthalmic solution Place 1 drop into both eyes 3 (three) times daily.     cholecalciferol (VITAMIN D3) 10 MCG (400 UNIT) TABS tablet Take 400 Units by mouth daily.     docusate sodium (COLACE) 100 MG capsule Take 1 capsule (100 mg total) by mouth 2 (two) times daily as needed for mild constipation. (Patient not taking: Reported on 04/01/2022) 10 capsule 0   dorzolamide-timolol (COSOPT) 22.3-6.8 MG/ML ophthalmic solution Place 1 drop into both eyes 2 (two) times daily.     feeding supplement, GLUCERNA SHAKE, (GLUCERNA SHAKE) LIQD Take 237 mLs by mouth 3 (three) times daily between meals. (Patient taking differently: Take  237 mLs by mouth 2 (two) times daily between meals.)  0   Finerenone (KERENDIA) 20 MG TABS Take 10 mg by mouth daily.     latanoprost (XALATAN) 0.005 % ophthalmic solution Place 1 drop into both eyes at bedtime.      levothyroxine (SYNTHROID) 88 MCG tablet Take 88 mcg by mouth daily before breakfast.     memantine (NAMENDA) 10 MG tablet Take 10 mg by mouth in the morning and at bedtime.     mirtazapine (REMERON SOL-TAB) 15 MG disintegrating tablet Take 1 tablet (15 mg total) by mouth at bedtime.     olmesartan (BENICAR) 20 MG tablet Take 20 mg by mouth daily.     pantoprazole (PROTONIX) 40 MG tablet Take 1 tablet (40 mg total) by mouth daily. 30 tablet 2   polyethylene glycol (MIRALAX / GLYCOLAX) packet Take 17 g by mouth daily. (Patient taking differently: Take 17 g by mouth daily as needed for mild constipation.) 14 each 0   RHOPRESSA 0.02 % SOLN Place 1 drop into both eyes at bedtime.     Tamsulosin HCl (FLOMAX) 0.4 MG CAPS Take 0.4 mg by mouth in the morning and at bedtime.     No current facility-administered medications on file  prior to visit.    Allergies:   Allergies  Allergen Reactions   Metformin And Related Other (See Comments)    Gi intolerance   [name] [Insulin Glargine-Lixisenatide] Other (See Comments)    Stomach cramps      OBJECTIVE:  Physical Exam  There were no vitals filed for this visit. There is no height or weight on file to calculate BMI. No results found.      No data to display           General: well developed, well nourished, seated, in no evident distress Head: head normocephalic and atraumatic.   Neck: supple with no carotid or supraclavicular bruits Cardiovascular: regular rate and rhythm, no murmurs Musculoskeletal: no deformity Skin:  no rash/petichiae Vascular:  Normal pulses all extremities   Neurologic Exam Mental Status: Awake and fully alert.  Fluent speech and language.  Oriented to place and time. Recent and remote memory intact. Attention span, concentration and fund of knowledge appropriate. Mood and affect appropriate.  Cranial Nerves: Fundoscopic exam reveals sharp disc margins. Pupils equal, briskly reactive to light. Extraocular movements full without nystagmus. Visual fields full to confrontation. Hearing intact. Facial sensation intact. Face, tongue, palate moves normally and symmetrically.  Motor: Normal bulk and tone. Normal strength in all tested extremity muscles Sensory.: intact to touch , pinprick , position and vibratory sensation.  Coordination: Rapid alternating movements normal in all extremities. Finger-to-nose and heel-to-shin performed accurately bilaterally. Gait and Station: Arises from chair without difficulty. Stance is normal. Gait demonstrates normal stride length and balance with ***. Tandem walk and heel toe ***.  Reflexes: 1+ and symmetric.   NIHSS  *** Modified [place]  ***   ASSESSMENT: Mike Mack is a 86 y.o. year old male ***. Vascular risk factors include ***.    PLAN:  *** : Residual deficit: ***. Continue  aspirin 81 mg daily  and ***  for secondary stroke prevention.  Discussed secondary stroke prevention measures and importance of close PCP follow up for aggressive stroke risk factor management. I have gone over the pathophysiology of stroke, warning signs and symptoms, risk factors and their management in some detail with instructions to go to the closest emergency room for symptoms of concern. HTN: BP  goal <130/90.  Stable on *** per PCP HLD: LDL goal <70. Recent LDL 38.  DMII: A1c goal<7.0. Recent A1c 7.5. Continue insulin as directed by PCP.     Follow up in *** or call earlier if needed   CC:  [facility] provider: Dr. Leonie Man PCP: [name], MD    I spent *** minutes of face-to-face and non-face-to-face time with patient.  This included previsit chart review including review of recent hospitalization, lab review, study review, order entry, electronic health record documentation, patient education regarding recent stroke including etiology, secondary stroke prevention measures and importance of managing stroke risk factors, residual deficits and typical recovery time and answered all other questions to patient satisfaction   Debbora Presto, [facility]  [facility] [address] [place], [place] [address]  Phone [phone] Fax [phone] Note: This document was prepared with digital dictation and possible smart phrase technology. Any transcriptional errors that result from this process are unintentional.

## 2022-05-09 ENCOUNTER — Encounter: Payer: Self-pay | Admitting: Family Medicine

## 2022-05-09 ENCOUNTER — Ambulatory Visit: Payer: Medicare HMO | Admitting: Family Medicine

## 2022-05-09 VITALS — BP 129/76 | HR 74 | Ht 65.0 in

## 2022-05-09 DIAGNOSIS — I639 Cerebral infarction, unspecified: Secondary | ICD-10-CM

## 2022-05-09 NOTE — Patient Instructions (Signed)
Below is our plan:  tiny subacute infarct versus artifact : Residual deficit: none from stroke, wheelchair bound and dementia at baseline. Continue aspirin 81 mg daily  and atorvastatin '20mg'$  daily  for secondary stroke prevention.  Discussed secondary stroke prevention measures and importance of close PCP follow up for aggressive stroke risk factor management. I have gone over the pathophysiology of stroke, warning signs and symptoms, risk factors and their management in some detail with instructions to go to the closest emergency room for symptoms of concern. HTN: BP goal <130/90.  Stable on olmesartan '20mg'$  daily per PCP HLD: LDL goal <70. Recent LDL 38. Continue atorvastatin '20mg'$  daily per PCP.  DMII: A1c goal<7.0. Recent A1c 7.5. Continue insulin as directed by PCP.   Dementia: continue follow up with PCP.    We will continue to monitor. Please follow up closely with Dr Ashby Dawes regarding mirtazipine. This medication may make him more sleepy but ask PCP if this is needed to be refilled.   Please make sure you are staying well hydrated. I recommend 50-60 ounces daily. Well balanced diet and regular exercise encouraged. Consistent sleep schedule with 6-8 hours recommended.   Management of Memory Problems   There are some general things you can do to help manage your memory problems.  Your memory may not in fact recover, but by using techniques and strategies you will be able to manage your memory difficulties better.   1)  Establish a routine. Try to establish and then stick to a regular routine.  By doing this, you will get used to what to expect and you will reduce the need to rely on your memory.  Also, try to do things at the same time of day, such as taking your medication or checking your calendar first thing in the morning. Think about think that you can do as a part of a regular routine and make a list.  Then enter them into a daily planner to remind you.  This will help you establish  a routine.   2)  Organize your environment. Organize your environment so that it is uncluttered.  Decrease visual stimulation.  Place everyday items such as keys or cell phone in the same place every day (ie.  Basket next to front door) Use post it notes with a brief message to yourself (ie. Turn off light, lock the door) Use labels to indicate where things go (ie. Which cupboards are for food, dishes, etc.) Keep a notepad and pen by the telephone to take messages   3)  Memory Aids A diary or journal/notebook/daily planner Making a list (shopping list, chore list, to do list that needs to be done) Using an alarm as a reminder (kitchen timer or cell phone alarm) Using cell phone to store information (Notes, Calendar, Reminders) Calendar/White board placed in a prominent position Post-it notes   In order for memory aids to be useful, you need to have good habits.  It's no good remembering to make a note in your journal if you don't remember to look in it.  Try setting aside a certain time of day to look in journal.   4)  Improving mood and managing fatigue. There may be other factors that contribute to memory difficulties.  Factors, such as anxiety, depression and tiredness can affect memory. Regular gentle exercise can help improve your mood and give you more energy. Exercise: there are short videos created by the [employer] on Health specially for older adults: https://bit.ly/2I30q97.  Mediterranean  diet: which emphasizes fruits, vegetables, whole grains, legumes, fish, and other seafood; unsaturated fats such as olive oils; and low amounts of red meat, eggs, and sweets. A variation of this, called MIND ([place] Intervention for Neurodegenerative Delay) incorporates the DASH (Dietary Approaches to Stop Hypertension) diet, which has been shown to lower high blood pressure, a risk factor for Alzheimer's disease. More information at:  RepublicForum.gl.  Aerobic exercise that improve heart health is also good for the mind.  [employer] on Aging have short videos for exercises that you can do at home: GoldCloset.com.ee Simple relaxation techniques may help relieve symptoms of anxiety Try to get back to completing activities or hobbies you enjoyed doing in the past. Learn to pace yourself through activities to decrease fatigue. Find out about some local support groups where you can share experiences with others. Try and achieve 7-8 hours of sleep at night.   Resources for Family/Caregiver  Online caregiver support groups can be found at LDLive.be or call Alzheimer's Association's 24/7 hotline: (970) 176-9713. [place] [facility] offers in-person, virtual support groups and individual counseling for both care partners and persons with memory loss. Call for more information at [phone].   Advanced care plan: there are two types of Power of Attorney: healthcare and durable. Healthcare POA is a designated person to make healthcare decisions on your behalf if you were too sick to make them yourself. This person can be selected and documented by your physician. Durable POA has to be set up with a lawyer who takes charge of your finances and estate if you were too sick or cognitively impaired to manage your finances accurately. You can find a local Elder Engineer, mining here: ToyShower.it.  Check out www.planyourlifespan.org, which will help you plan before a crisis and decide who will take care of life considerations in a circumstance where you may not be able to speak for yourself.   Helpful books (available on [employer] or your local bookstore):  By Dr. Army Chaco: Keeping Love Alive as Memories Fade: The 5 Love Languages and the Alzheimer's Journey Mar 20, 2015 The Dementia Care Partner's Workbook: A Guide for Understanding,  Education, and [employer] - November 17, 2017.  Both available for less than $15.   "Coping with behavior change in dementia: a family caregiver's guide" by [place] "A Caregiver's Guide to Dementia: Using Activities and Other Strategies to Prevent, Reduce and Manage Behavioral Symptoms" by Osie Bond Gitlin and [employer].  Arts development officer of Joy for the Person with Alzheimer's or Dementia" 4th edition by Vance Peper  Caregiver videos on common behaviors related to dementia: quierodirigir.com  [place] Caregiver Portal: free to sign up, links to local resources: https://[place]-caregivers.com/login   Please continue follow up with care team as directed.   Follow up with me as needed   You may receive a survey regarding today's visit. I encourage you to leave honest feed back as I do use this information to improve patient care. Thank you for seeing me today!

## 2022-05-16 NOTE — Progress Notes (Signed)
I agree with the above plan 

## 2022-05-18 IMAGING — CT CT IMAGE GUIDED DRAINAGE BY PERCUTANEOUS CATHETER
1 of 3 series · 13 of 32 positions shown, 18 images · non-contrast
Comparison: none

INDICATION: 83-year-old male with a history subphrenic abscess
TECHNIQUE: Informed written consent was obtained from the patient after a
thorough discussion of the procedural risks, benefits and
alternatives. All questions were addressed. Maximal Sterile Barrier
Technique was utilized including caps, mask, sterile gowns, sterile
gloves, sterile drape, hand hygiene and skin antiseptic. A timeout
was performed prior to the initiation of the procedure.

[Series 2: i-spiral 5.0 bf37 · axial · 0.95mm/px · z∈[+1317,+1436]mm · 13 of 38 slices shown, 18 images]
[im 2/38  soft-tissue]
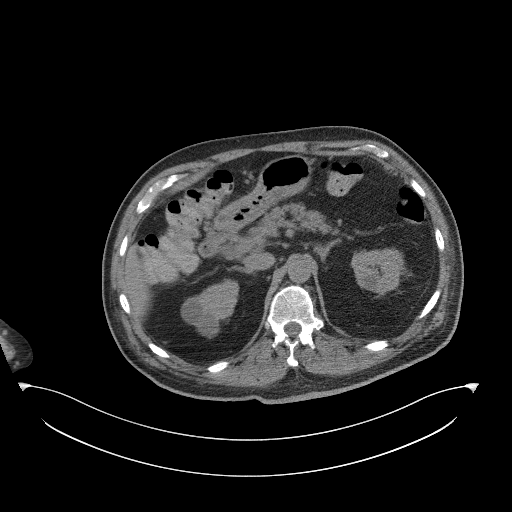
[im 2/38  bone]
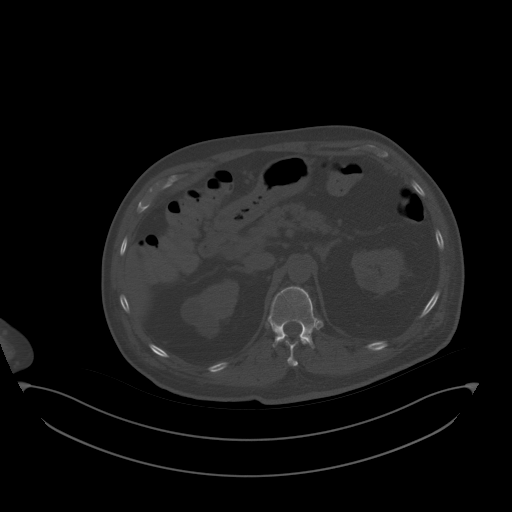
[im 6/38  soft-tissue]
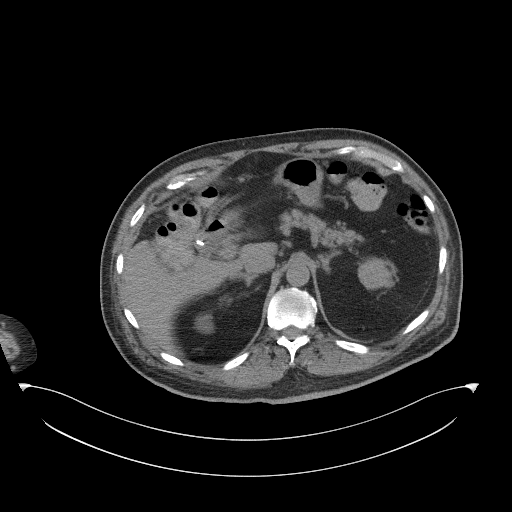
[im 8/38  soft-tissue]
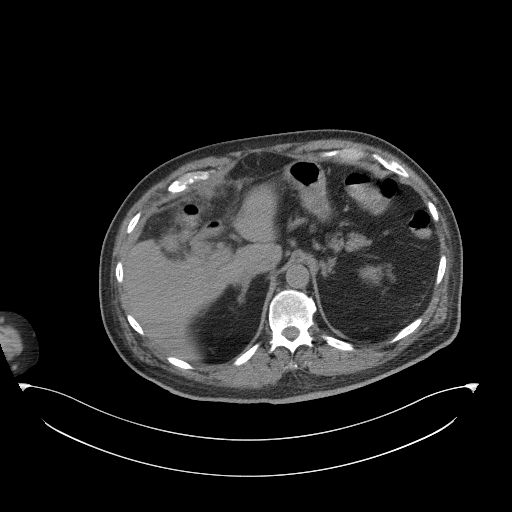
[im 12/38  soft-tissue]
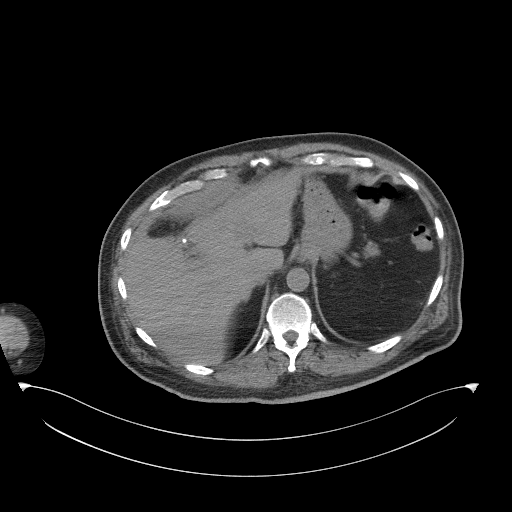
[im 14/38  soft-tissue]
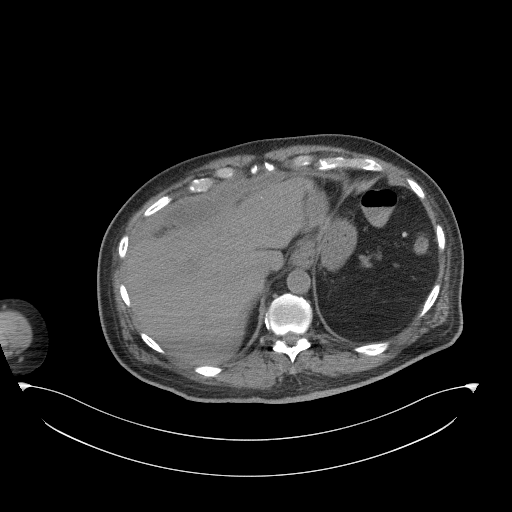
[im 18/38  soft-tissue]
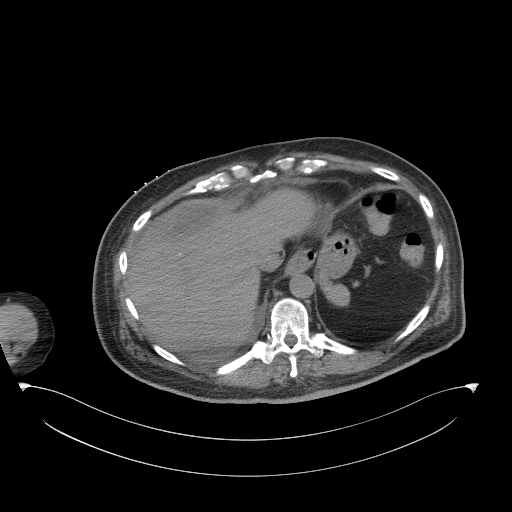
[im 20/38  soft-tissue]
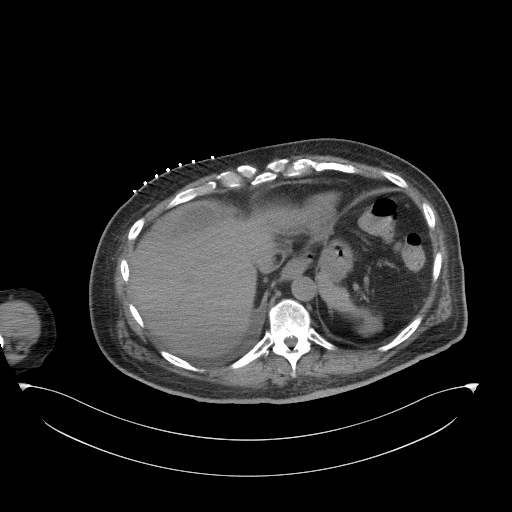
[im 24/38  soft-tissue]
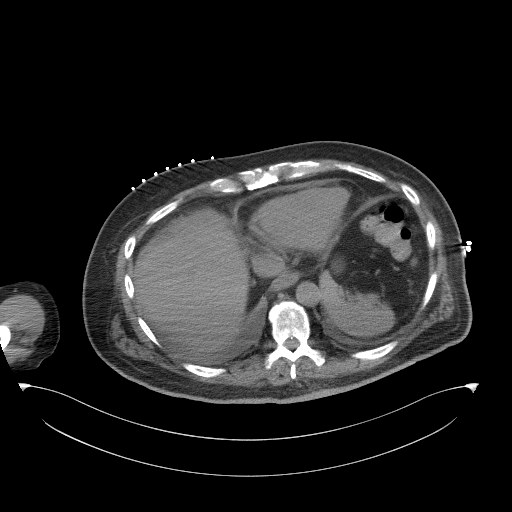
[im 26/38  soft-tissue]
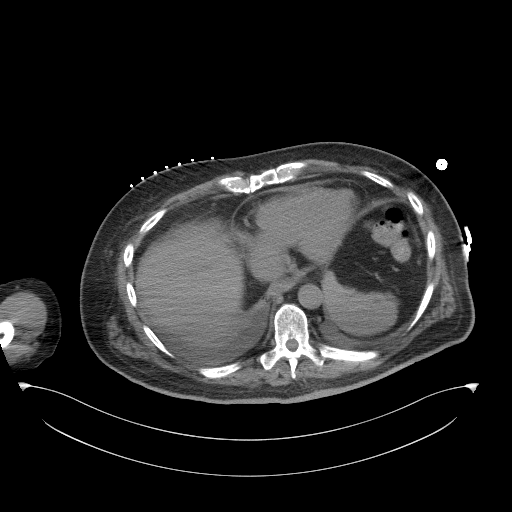
[im 26/38  bone]
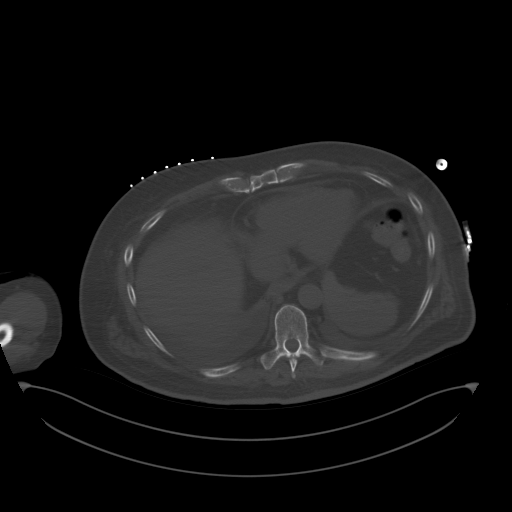
[im 30/38  soft-tissue]
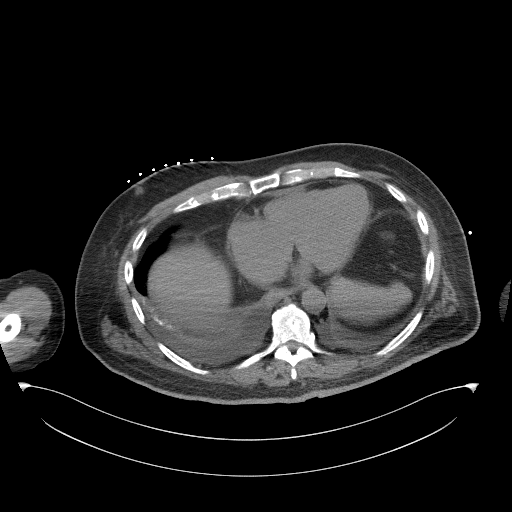
[im 30/38  lung]
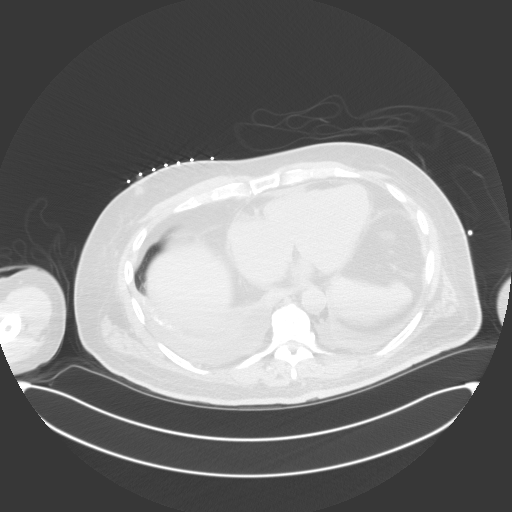
[im 32/38  soft-tissue]
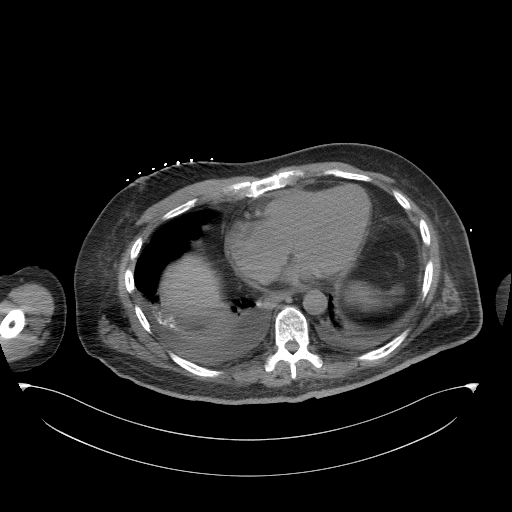
[im 32/38  lung]
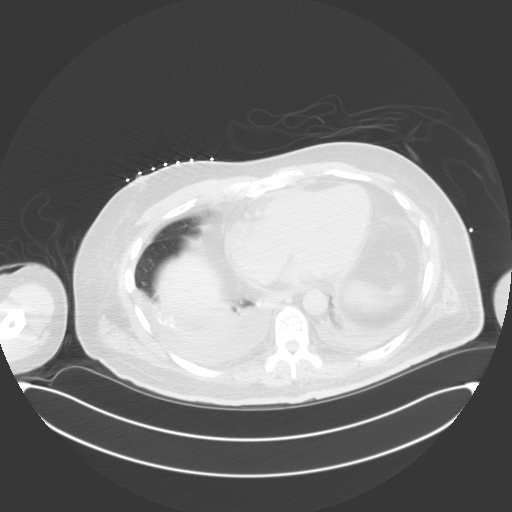
[im 34/38  lung]
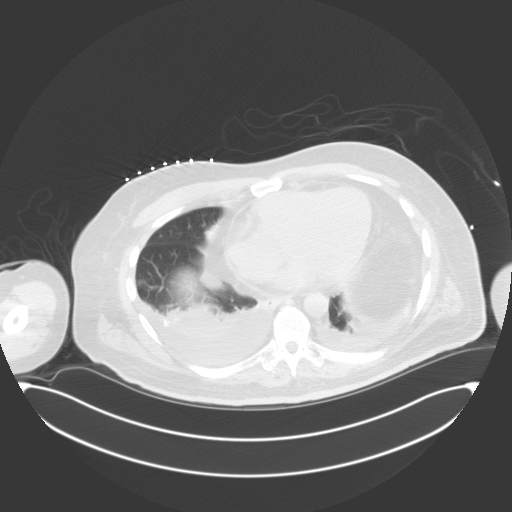
[im 36/38  soft-tissue]
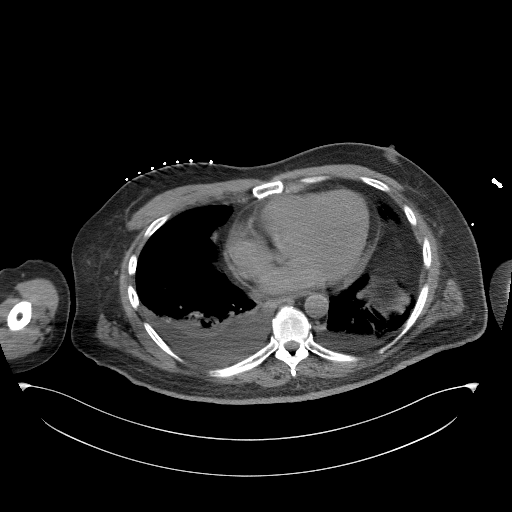
[im 36/38  lung]
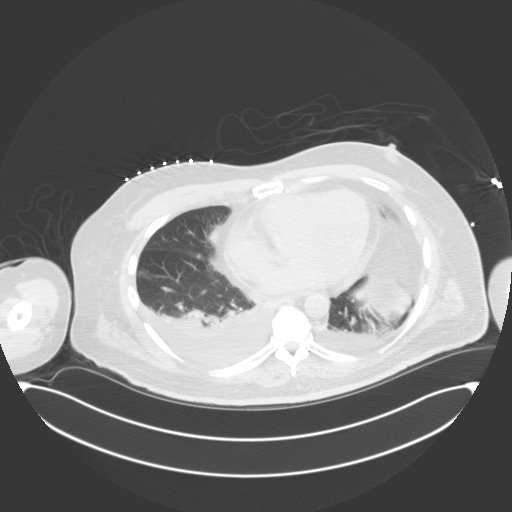

[13 of 32 positions shown; findings below may reference images not displayed]

EXAM:
CT GUIDED DRAINAGE OF  ABSCESS

MEDICATIONS:
The patient is currently admitted to the hospital and receiving
intravenous antibiotics. The antibiotics were administered within an
appropriate time frame prior to the initiation of the procedure.

ANESTHESIA/SEDATION:
0.25 mg IV Versed 50 mcg IV Fentanyl

Moderate Sedation Time:  10 minutes

The patient was continuously monitored during the procedure by the
interventional radiology nurse under my direct supervision.

COMPLICATIONS:
None
PROCEDURE:
The operative field was prepped with Chlorhexidine in a sterile
fashion, and a sterile drape was applied covering the operative
field. A sterile gown and sterile gloves were used for the
procedure. Local anesthesia was provided with 1% Lidocaine.

Patient was positioned supine position on CT gantry table. Scout CT
was acquired for planning purposes.

The patient was then prepped and draped in the usual sterile
fashion. 1% lidocaine was used for local anesthesia. Using CT
guidance, trocar needle was advanced into the right upper quadrant
abscess. Once we aspirated purulent material, modified Seldinger
technique was used to place a 10 French drain.

Proximally 50 cc of purulent material aspirated through the pigtail
catheter. Ten French drain was sutured in place and attached to bulb
suction.

Final CT image was acquired.

Patient tolerated the procedure well and remained hemodynamically
stable throughout.

No complications were encountered and no significant blood loss.
FINDINGS: Right subphrenic abscess, similar to the comparison.

Pigtail drainage catheter into the collection with approximately 50
cc of purulent material aspirated.
IMPRESSION: Status post CT-guided drainage of right upper quadrant abscess.

## 2022-05-29 DIAGNOSIS — L89522 Pressure ulcer of left ankle, stage 2: Secondary | ICD-10-CM | POA: Diagnosis not present

## 2022-05-29 DIAGNOSIS — E1122 Type 2 diabetes mellitus with diabetic chronic kidney disease: Secondary | ICD-10-CM | POA: Diagnosis not present

## 2022-05-29 DIAGNOSIS — E1140 Type 2 diabetes mellitus with diabetic neuropathy, unspecified: Secondary | ICD-10-CM | POA: Diagnosis not present

## 2022-05-29 DIAGNOSIS — D631 Anemia in chronic kidney disease: Secondary | ICD-10-CM | POA: Diagnosis not present

## 2022-05-29 DIAGNOSIS — E113553 Type 2 diabetes mellitus with stable proliferative diabetic retinopathy, bilateral: Secondary | ICD-10-CM | POA: Diagnosis not present

## 2022-05-29 DIAGNOSIS — I129 Hypertensive chronic kidney disease with stage 1 through stage 4 chronic kidney disease, or unspecified chronic kidney disease: Secondary | ICD-10-CM | POA: Diagnosis not present

## 2022-05-29 DIAGNOSIS — N1832 Chronic kidney disease, stage 3b: Secondary | ICD-10-CM | POA: Diagnosis not present

## 2022-05-29 DIAGNOSIS — I69351 Hemiplegia and hemiparesis following cerebral infarction affecting right dominant side: Secondary | ICD-10-CM | POA: Diagnosis not present

## 2022-05-29 DIAGNOSIS — L89313 Pressure ulcer of right buttock, stage 3: Secondary | ICD-10-CM | POA: Diagnosis not present

## 2022-05-31 DIAGNOSIS — L89522 Pressure ulcer of left ankle, stage 2: Secondary | ICD-10-CM | POA: Diagnosis not present

## 2022-05-31 DIAGNOSIS — L89313 Pressure ulcer of right buttock, stage 3: Secondary | ICD-10-CM | POA: Diagnosis not present

## 2022-05-31 DIAGNOSIS — E1122 Type 2 diabetes mellitus with diabetic chronic kidney disease: Secondary | ICD-10-CM | POA: Diagnosis not present

## 2022-05-31 DIAGNOSIS — I129 Hypertensive chronic kidney disease with stage 1 through stage 4 chronic kidney disease, or unspecified chronic kidney disease: Secondary | ICD-10-CM | POA: Diagnosis not present

## 2022-06-28 DIAGNOSIS — L89313 Pressure ulcer of right buttock, stage 3: Secondary | ICD-10-CM | POA: Diagnosis not present

## 2022-06-28 DIAGNOSIS — N1832 Chronic kidney disease, stage 3b: Secondary | ICD-10-CM | POA: Diagnosis not present

## 2022-06-28 DIAGNOSIS — E113553 Type 2 diabetes mellitus with stable proliferative diabetic retinopathy, bilateral: Secondary | ICD-10-CM | POA: Diagnosis not present

## 2022-06-28 DIAGNOSIS — E782 Mixed hyperlipidemia: Secondary | ICD-10-CM | POA: Diagnosis not present

## 2022-06-28 DIAGNOSIS — E1140 Type 2 diabetes mellitus with diabetic neuropathy, unspecified: Secondary | ICD-10-CM | POA: Diagnosis not present

## 2022-06-28 DIAGNOSIS — D631 Anemia in chronic kidney disease: Secondary | ICD-10-CM | POA: Diagnosis not present

## 2022-06-28 DIAGNOSIS — L89522 Pressure ulcer of left ankle, stage 2: Secondary | ICD-10-CM | POA: Diagnosis not present

## 2022-06-28 DIAGNOSIS — E1122 Type 2 diabetes mellitus with diabetic chronic kidney disease: Secondary | ICD-10-CM | POA: Diagnosis not present

## 2022-06-28 DIAGNOSIS — I129 Hypertensive chronic kidney disease with stage 1 through stage 4 chronic kidney disease, or unspecified chronic kidney disease: Secondary | ICD-10-CM | POA: Diagnosis not present

## 2022-06-28 DIAGNOSIS — E118 Type 2 diabetes mellitus with unspecified complications: Secondary | ICD-10-CM | POA: Diagnosis not present

## 2022-06-28 DIAGNOSIS — I69351 Hemiplegia and hemiparesis following cerebral infarction affecting right dominant side: Secondary | ICD-10-CM | POA: Diagnosis not present

## 2022-07-05 DIAGNOSIS — B91 Sequelae of poliomyelitis: Secondary | ICD-10-CM | POA: Diagnosis not present

## 2022-07-05 DIAGNOSIS — Z794 Long term (current) use of insulin: Secondary | ICD-10-CM | POA: Diagnosis not present

## 2022-07-05 DIAGNOSIS — E11319 Type 2 diabetes mellitus with unspecified diabetic retinopathy without macular edema: Secondary | ICD-10-CM | POA: Diagnosis not present

## 2022-07-05 DIAGNOSIS — I69998 Other sequelae following unspecified cerebrovascular disease: Secondary | ICD-10-CM | POA: Diagnosis not present

## 2022-07-05 DIAGNOSIS — I739 Peripheral vascular disease, unspecified: Secondary | ICD-10-CM | POA: Diagnosis not present

## 2022-07-05 DIAGNOSIS — I69341 Monoplegia of lower limb following cerebral infarction affecting right dominant side: Secondary | ICD-10-CM | POA: Diagnosis not present

## 2022-07-05 DIAGNOSIS — G301 Alzheimer's disease with late onset: Secondary | ICD-10-CM | POA: Diagnosis not present

## 2022-07-05 DIAGNOSIS — E782 Mixed hyperlipidemia: Secondary | ICD-10-CM | POA: Diagnosis not present

## 2022-07-05 DIAGNOSIS — I10 Essential (primary) hypertension: Secondary | ICD-10-CM | POA: Diagnosis not present

## 2022-07-08 ENCOUNTER — Emergency Department (HOSPITAL_COMMUNITY)
Admission: EM | Admit: 2022-07-08 | Discharge: 2022-07-08 | Disposition: A | Discharge status: Home / Self Care | Payer: Medicare HMO | Attending: Emergency Medicine | Admitting: Emergency Medicine

## 2022-07-08 ENCOUNTER — Encounter (HOSPITAL_COMMUNITY): Payer: Self-pay

## 2022-07-08 ENCOUNTER — Emergency Department (HOSPITAL_COMMUNITY): Payer: Medicare HMO

## 2022-07-08 ENCOUNTER — Other Ambulatory Visit: Payer: Self-pay

## 2022-07-08 DIAGNOSIS — E878 Other disorders of electrolyte and fluid balance, not elsewhere classified: Secondary | ICD-10-CM | POA: Insufficient documentation

## 2022-07-08 DIAGNOSIS — Z7982 Long term (current) use of aspirin: Secondary | ICD-10-CM | POA: Insufficient documentation

## 2022-07-08 DIAGNOSIS — Z20822 Contact with and (suspected) exposure to covid-19: Secondary | ICD-10-CM | POA: Insufficient documentation

## 2022-07-08 DIAGNOSIS — I10 Essential (primary) hypertension: Secondary | ICD-10-CM | POA: Insufficient documentation

## 2022-07-08 DIAGNOSIS — J029 Acute pharyngitis, unspecified: Secondary | ICD-10-CM

## 2022-07-08 DIAGNOSIS — E871 Hypo-osmolality and hyponatremia: Secondary | ICD-10-CM | POA: Insufficient documentation

## 2022-07-08 DIAGNOSIS — D72829 Elevated white blood cell count, unspecified: Secondary | ICD-10-CM | POA: Insufficient documentation

## 2022-07-08 DIAGNOSIS — Z79899 Other long term (current) drug therapy: Secondary | ICD-10-CM | POA: Insufficient documentation

## 2022-07-08 DIAGNOSIS — D649 Anemia, unspecified: Secondary | ICD-10-CM | POA: Diagnosis not present

## 2022-07-08 DIAGNOSIS — R0789 Other chest pain: Secondary | ICD-10-CM | POA: Diagnosis not present

## 2022-07-08 DIAGNOSIS — R059 Cough, unspecified: Secondary | ICD-10-CM | POA: Diagnosis not present

## 2022-07-08 DIAGNOSIS — J90 Pleural effusion, not elsewhere classified: Secondary | ICD-10-CM | POA: Diagnosis not present

## 2022-07-08 LAB — CBC WITH DIFFERENTIAL/PLATELET
Abs Immature Granulocytes: 0.03 10*3/uL (ref 0.00–0.07)
Basophils Absolute: 0 10*3/uL (ref 0.0–0.1)
Basophils Relative: 0 %
Eosinophils Absolute: 0 10*3/uL (ref 0.0–0.5)
Eosinophils Relative: 0 %
HCT: 40.6 % (ref 39.0–52.0)
Hemoglobin: 12.5 g/dL — ABNORMAL LOW (ref 13.0–17.0)
Immature Granulocytes: 0 %
Lymphocytes Relative: 16 %
Lymphs Abs: 1.8 10*3/uL (ref 0.7–4.0)
MCH: 28 pg (ref 26.0–34.0)
MCHC: 30.8 g/dL (ref 30.0–36.0)
MCV: 90.8 fL (ref 80.0–100.0)
Monocytes Absolute: 0.3 10*3/uL (ref 0.1–1.0)
Monocytes Relative: 3 %
Neutro Abs: 9.3 10*3/uL — ABNORMAL HIGH (ref 1.7–7.7)
Neutrophils Relative %: 81 %
Platelets: 294 10*3/uL (ref 150–400)
RBC: 4.47 MIL/uL (ref 4.22–5.81)
RDW: 15.8 % — ABNORMAL HIGH (ref 11.5–15.5)
WBC: 11.4 10*3/uL — ABNORMAL HIGH (ref 4.0–10.5)
nRBC: 0 % (ref 0.0–0.2)

## 2022-07-08 LAB — CBG MONITORING, ED: Glucose-Capillary: 113 mg/dL — ABNORMAL HIGH (ref 70–99)

## 2022-07-08 LAB — RESP PANEL BY RT-PCR (RSV, FLU A&B, COVID)  RVPGX2
Influenza A by PCR: NEGATIVE
Influenza B by PCR: NEGATIVE
Resp Syncytial Virus by PCR: NEGATIVE
SARS Coronavirus 2 by RT PCR: NEGATIVE

## 2022-07-08 LAB — BASIC METABOLIC PANEL
Anion gap: 11 (ref 5–15)
BUN: 19 mg/dL (ref 8–23)
CO2: 21 mmol/L — ABNORMAL LOW (ref 22–32)
Calcium: 9.1 mg/dL (ref 8.9–10.3)
Chloride: 102 mmol/L (ref 98–111)
Creatinine, Ser: 1.23 mg/dL (ref 0.61–1.24)
GFR, Estimated: 58 mL/min — ABNORMAL LOW (ref >60)
Glucose, Bld: 121 mg/dL — ABNORMAL HIGH (ref 70–99)
Potassium: 4.4 mmol/L (ref 3.5–5.1)
Sodium: 134 mmol/L — ABNORMAL LOW (ref 135–145)

## 2022-07-08 LAB — TROPONIN I (HIGH SENSITIVITY): Troponin I (High Sensitivity): 9 ng/L (ref <18)

## 2022-07-08 LAB — GROUP A STREP BY PCR: Group A Strep by PCR: NOT DETECTED

## 2022-07-08 MED ORDER — FAMOTIDINE 20 MG PO TABS
20.0000 mg | ORAL_TABLET | Freq: Once | ORAL | Status: AC
  Administered 2022-07-08: 20 mg via ORAL
  Filled 2022-07-08: qty 1

## 2022-07-08 MED ORDER — ACETAMINOPHEN 325 MG PO TABS
650.0000 mg | ORAL_TABLET | Freq: Once | ORAL | Status: AC
  Administered 2022-07-08: 650 mg via ORAL
  Filled 2022-07-08: qty 2

## 2022-07-08 MED ORDER — LIDOCAINE VISCOUS HCL 2 % MT SOLN
15.0000 mL | Freq: Once | OROMUCOSAL | Status: AC
  Administered 2022-07-08: 15 mL via ORAL
  Filled 2022-07-08: qty 15

## 2022-07-08 MED ORDER — ALUM & MAG HYDROXIDE-SIMETH 200-200-20 MG/5ML PO SUSP
30.0000 mL | Freq: Once | ORAL | Status: AC
  Administered 2022-07-08: 30 mL via ORAL
  Filled 2022-07-08: qty 30

## 2022-07-08 MED ORDER — PANTOPRAZOLE SODIUM 40 MG PO TBEC: 40.0000 mg | DELAYED_RELEASE_TABLET | Freq: Every day | ORAL | 1 refills | Status: AC

## 2022-07-08 MED ORDER — SODIUM CHLORIDE 0.9 % IV BOLUS
1000.0000 mL | Freq: Once | INTRAVENOUS | Status: AC
  Administered 2022-07-08: 1000 mL via INTRAVENOUS

## 2022-07-08 NOTE — ED Provider Triage Note (Signed)
Emergency Medicine Provider Triage Evaluation Note  Mike Mack , a 87 y.o. male  was evaluated in triage.  Pt complains of cough and sore throat since yesterday.  No fever, chest pain, shortness of breath, nausea, vomiting.  No known sick contacts.  Review of Systems  Positive: As above Negative: As above  Physical Exam  BP 120/67 (BP Location: Left Arm)   Pulse 77   Temp 98.6 F (37 C) (Oral)   Resp 16   Ht '5\' 5"'$  (1.651 m)   Wt 81.6 kg   SpO2 99%   BMI 29.95 kg/m  Gen:   Awake, no distress   Resp:  Normal effort  MSK:   Moves extremities without difficulty  Other:    Medical Decision Making  Medically screening exam initiated at 5:06 PM.  Appropriate orders placed.  Mike Mack was informed that the remainder of the evaluation will be completed by another provider, this initial triage assessment does not replace that evaluation, and the importance of remaining in the ED until their evaluation is complete.    Rex Kras, [place] 07/08/22 [phone]

## 2022-07-08 NOTE — ED Provider Notes (Signed)
[place] EMERGENCY DEPARTMENT AT [facility] Provider Note   CSN: [id number] Arrival date & time: 07/08/22  1646     History  Chief Complaint  Patient presents with   Sore Throat    Mike Mack is a 87 y.o. male. With past medical history of hypertension, BPH, CVA, who presents to the emergency department with sore throat.   States yesterday he began having sore throat. He states he then developed a dry cough. He has hoarseness that started with these symptoms. He denies having fever, headache, rhinorrhea, congestion at home. He does state he has felt some mild central chest tightness since yesterday. Denies shortness of breath or palpitations. He overall feels lousy.    Sore Throat       Home Medications Prior to Admission medications   Medication Sig Start Date End Date Taking? Authorizing Provider  acetaminophen (TYLENOL) 325 MG tablet Take 2 tablets (650 mg total) by mouth every 6 (six) hours as needed for mild pain (or Fever >/= 101). 11/27/20   Debbe Odea, MD  aspirin EC 81 MG tablet Take 1 tablet (81 mg total) by mouth daily. Swallow whole. 04/06/22   Little Ishikawa, MD  atorvastatin (LIPITOR) 20 MG tablet Take 20 mg by mouth every evening.  11/03/13   [provider]  B Complex Vitamins (B COMPLEX PO) Take 1 tablet by mouth at bedtime.    [provider]  brimonidine (ALPHAGAN) 0.2 % ophthalmic solution Place 1 drop into both eyes 3 (three) times daily.    [provider]  cholecalciferol (VITAMIN D3) 10 MCG (400 UNIT) TABS tablet Take 400 Units by mouth daily.    [provider]  docusate sodium (COLACE) 100 MG capsule Take 1 capsule (100 mg total) by mouth 2 (two) times daily as needed for mild constipation. 03/09/22   Geradine Girt, DO  dorzolamide-timolol (COSOPT) 22.3-6.8 MG/ML ophthalmic solution Place 1 drop into both eyes 2 (two) times daily. 03/04/20   [provider]  feeding supplement,  GLUCERNA SHAKE, (GLUCERNA SHAKE) LIQD Take 237 mLs by mouth 3 (three) times daily between meals. Patient taking differently: Take 237 mLs by mouth 2 (two) times daily between meals. 03/25/20   Antonieta Pert, MD  Finerenone (KERENDIA) 20 MG TABS Take 10 mg by mouth daily.    [provider]  latanoprost (XALATAN) 0.005 % ophthalmic solution Place 1 drop into both eyes at bedtime.  11/17/14   [provider]  levothyroxine (SYNTHROID) 88 MCG tablet Take 88 mcg by mouth daily before breakfast.    [provider]  memantine (NAMENDA) 10 MG tablet Take 10 mg by mouth in the morning and at bedtime. 11/04/20   [provider]  mirtazapine (REMERON SOL-TAB) 15 MG disintegrating tablet Take 1 tablet (15 mg total) by mouth at bedtime. 03/09/22   Geradine Girt, DO  olmesartan (BENICAR) 20 MG tablet Take 20 mg by mouth daily.    [provider]  pantoprazole (PROTONIX) 40 MG tablet Take 1 tablet (40 mg total) by mouth daily. 04/05/22   Little Ishikawa, MD  polyethylene glycol [facility] Floria Raveling) packet Take 17 g by mouth daily. Patient taking differently: Take 17 g by mouth daily as needed for mild constipation. 09/06/13   [name], MD  RHOPRESSA 0.02 % SOLN Place 1 drop into both eyes at bedtime. 03/05/20   [provider]  Tamsulosin HCl (FLOMAX) 0.4 MG CAPS Take 0.4 mg by mouth in the  morning and at bedtime.    [provider]      Allergies    Metformin and related and Soliqua [insulin glargine-lixisenatide]    Review of Systems   Review of Systems  HENT:  Positive for sore throat.   Respiratory:  Positive for cough and chest tightness.   All other systems reviewed and are negative.   Physical Exam Updated Vital Signs BP (!) 82/58 (BP Location: Left Arm)   Pulse 77   Temp 98.4 F (36.9 C) (Oral)   Resp 16   Ht '5\' 5"'$  (1.651 m)   Wt 81.6 kg   SpO2 100%   BMI 29.95 kg/m  Physical Exam Vitals and nursing note reviewed.   Constitutional:      General: He is not in acute distress.    Appearance: Normal appearance. He is ill-appearing.  HENT:     Head: Normocephalic.     Nose: No congestion or rhinorrhea.     Mouth/Throat:     Mouth: Mucous membranes are dry.     Pharynx: Oropharynx is clear. Uvula midline. Posterior oropharyngeal erythema present. No oropharyngeal exudate or uvula swelling.     Tonsils: No tonsillar exudate or tonsillar abscesses. 0 on the right. 0 on the left.  Eyes:     General: No scleral icterus.    Conjunctiva/sclera: Conjunctivae normal.  Cardiovascular:     Rate and Rhythm: Normal rate and regular rhythm.     Heart sounds: Normal heart sounds. No murmur heard. Pulmonary:     Effort: Pulmonary effort is normal. No tachypnea or respiratory distress.     Breath sounds: Decreased air movement present. No wheezing, rhonchi or rales.  Abdominal:     General: Bowel sounds are normal.     Palpations: Abdomen is soft.  Skin:    General: Skin is warm and dry.     Capillary Refill: Capillary refill takes less than 2 seconds.  Neurological:     General: No focal deficit present.     Mental Status: He is alert and oriented to person, place, and time.  Psychiatric:        Mood and Affect: Mood normal.        Behavior: Behavior normal. Behavior is cooperative.     ED Results / Procedures / Treatments   Labs (all labs ordered are listed, but only abnormal results are displayed) Labs Reviewed  RESP PANEL BY RT-PCR (RSV, FLU A&B, COVID)  [id number]  GROUP A STREP BY PCR  BASIC METABOLIC PANEL  CBC WITH DIFFERENTIAL/PLATELET  TROPONIN I (HIGH SENSITIVITY)    EKG None  Radiology DG Chest 2 View  Result Date: 07/08/2022 CLINICAL DATA:  Cough EXAM: CHEST - 2 VIEW COMPARISON:  Chest x-ray 04/01/2022 FINDINGS: There is a small right pleural effusion. There is no focal lung consolidation or pneumothorax. The cardiomediastinal silhouette is within normal limits. No acute fractures are  seen. IMPRESSION: Small right pleural effusion. Electronically Signed   By: Ronney Asters M.D.   On: 07/08/2022 17:28    Procedures Procedures   Medications Ordered in ED Medications  sodium chloride 0.9 % bolus 1,000 mL (has no administration in time range)    ED Course/ Medical Decision Making/ A&P Clinical Course as of 07/08/22 1820  Sat Jul 08, 2022  1748 On exam at bedside pt with BP 70s/50s. Repeated it to be 80s/50s. Will add on labs, IVF.   [LA]    Clinical Course User Index [LA] Mickie Hillier, PA-C   {  Care of patient being handed off to [employer], PA-C at change of shift. Pending work up. He presented for sore throat and cough. Found to be hypotensive in triage with chest tightness. He was moved back to a room for laboratory work up including troponin, EKG, IVF. Dispo pending work up  Final Clinical Impression(s) / ED Diagnoses Final diagnoses:  None    Rx / DC Orders ED Discharge Orders     None         Mickie Hillier, PA-C 07/08/22 1821    Tretha Sciara, MD 07/09/22 1259

## 2022-07-08 NOTE — ED Triage Notes (Signed)
States that it hurts to swallow, and he had a bad cough

## 2022-07-08 NOTE — Discharge Instructions (Signed)
Note the workup today is overall reassuring.  As discussed, symptoms most likely secondary to irritation of your esophagus and your stomach as you had back in September.  We will continue with your at home Protonix.  Avoid NSAIDs such as ibuprofen, Aleve.  You can take Tylenol for pain.  You can find Maalox over-the-counter if helpful.  Recommend liquid diet or pured foods until able to tolerate more solid foods.  Recommend follow-up with gastroenterology outpatient; see number attached your discharge papers to call to set up an appointment.  Please do not hesitate to return to emergency department for worrisome signs and symptoms we discussed become apparent.

## 2022-07-08 NOTE — ED Provider Notes (Signed)
Physical Exam  BP 122/61   Pulse 75   Temp 98.4 F (36.9 C) (Oral)   Resp 13   Ht '5\' 5"'$  (1.651 m)   Wt 81.6 kg   SpO2 95%   BMI 29.95 kg/m   Physical Exam Vitals and nursing note reviewed.  Constitutional:      General: He is not in acute distress.    Appearance: He is well-developed.  HENT:     Head: Normocephalic and atraumatic.     Nose: No congestion or rhinorrhea.     Mouth/Throat:     Comments: Uvula midline and rises symmetrically with phonation.  Tonsils are 1+ bilaterally with no obvious exudate.  Very mild posterior pharyngeal erythema noted.  No sublingual or submandibular swelling appreciated. Eyes:     Conjunctiva/sclera: Conjunctivae normal.  Cardiovascular:     Rate and Rhythm: Normal rate and regular rhythm.     Heart sounds: No murmur heard. Pulmonary:     Effort: Pulmonary effort is normal. No respiratory distress.     Breath sounds: Normal breath sounds.  Abdominal:     Palpations: Abdomen is soft.     Tenderness: There is no abdominal tenderness.     Comments: No abdominal tenderness  Musculoskeletal:        General: No swelling.     Cervical back: Neck supple.  Skin:    General: Skin is warm and dry.     Capillary Refill: Capillary refill takes less than 2 seconds.  Neurological:     Mental Status: He is alert.  Psychiatric:        Mood and Affect: Mood normal.     Procedures  Procedures  ED Course / MDM   Clinical Course as of 07/08/22 2051  Sat Jul 08, 2022  1748 On exam at bedside pt with BP 70s/50s. Repeated it to be 80s/50s. Will add on labs, IVF.   [LA]  1926 Chief complaint of poor PO.  HX of esophagitis with admission.  Was on PPI for extended amount of time.  Stopped recently  Symptoms all improving with PO/PPI  [CC]  1927 BP not consistent between machines. Back to normal prior to IVF [CC]    Clinical Course User Index [CC] Tretha Sciara, MD [LA] Mickie Hillier, PA-C   Medical Decision Making Risk OTC  drugs. Prescription drug management.   Patient care handed off from Theodis Blaze, PA-C at shift change.  See prior note for full details.  In short, patient coming in with complaints of sore throat.  Patient states that sore throat began last night but worsened earlier this morning.  Notes history of similar symptoms back in September when he was admitted to the hospital for another unrelated issue.  EGD at that time showed severe esophagitis with multiple nonbleeding superficial gastric ulcers and erosions.  Reports being placed on Protonix then states he was recently taken off by primary care provider earlier this week on Tuesday/Wednesday.  Denies NSAID use.  Reports associated mild dry cough yesterday.  States he has pain with swallowing but no difficulty breathing, fever, chest pain, shortness of breath, abdominal pain, nausea, vomiting, urinary symptoms, change in bowel habits.  Laboratory studies: Mild leukocytosis of 11.4.  Mild evidence of anemia with a hemoglobin of 12.50 which is improved from prior studies.  No platelet abnormalities.  Respiratory viral panel negative.  Initial CBG of 113.  Group A strep negative.  BMP significant for mild hyponatremia with a sodium of 134 and decreased  bicarb of 21 of which was supplemented via IV fluids.  Patient with creatinine 1.23, BUN of 19 GFR 58 of which is improved from prior studies performed.  Imaging studies: Chest x-ray was ordered which shows small right-sided pleural effusion.  Imaging was independently reviewed by me and in agreement with radiologist rotation.  EKG: Sinus rhythm without acute ischemic changes from prior EKGs  Sore Throat: Vital signs within normal range and stable throughout visit.  Patient did have 1 episode of hypotension with a machine used in triage area of which was no longer present when moved in the back; seem to be not true hypotension. Laboratory/imaging studies: See above Patient presenting with evidence of sore  throat.  Oropharyngeal exam relatively benign with only mild posterior pharyngeal erythema noted.  Patient seem to complain of more esophageal pain than oropharyngeal pain when describing symptoms.  Patient recently taken off PPI by primary care provider and states pain similar to prior episode he had severe esophagitis.  Patient tolerated p.o. after administration of Pepcid and Maalox while in the emergency department as well as significant improvement of symptoms.  Recommend continued outpatient therapy similarly with continuation of Protonix daily, Maalox as needed as well as Tylenol for pain.  Patient recommended to avoid NSAIDs.  Patient assessed independently by attending physician Dr. Oswald Hillock who was in agreement with treatment plan going forward.  Close follow-up with primary care recommended for reevaluation of symptoms as well as gastroenterology given that he is not yet established care outpatient.  Treatment plan discussed at length with patient and he acknowledged understanding was agreeable to said plan. Worrisome signs and symptoms were discussed with the patient and the patient acknowledged understanding to return to emergency department if notice.  Patient stable upon discharge.      Wilnette Kales, [place] 07/08/22 2051    Tretha Sciara, MD 07/09/22 1259

## 2022-07-13 ENCOUNTER — Telehealth: Payer: Self-pay | Admitting: *Deleted

## 2022-07-13 NOTE — Telephone Encounter (Signed)
     Patient  visit on 07/08/2022  at [facility] long ed  was for treatment   Have you beePatient feeling better and will make appt with primary dr and has been able to get medicine and has transportation access n able to follow up with your primary care physician?  The patient was able to obtain any needed medicine or equipment.  Are there diet recommendations that you are having difficulty following? NA Patient expresses understanding of discharge instructions and education provided has no other needs at this time.  Yes [place] [phone] [address] , [place] [address] Email : [email or web address]

## 2022-07-19 DIAGNOSIS — Z87440 Personal history of urinary (tract) infections: Secondary | ICD-10-CM | POA: Diagnosis not present

## 2022-07-19 DIAGNOSIS — F0390 Unspecified dementia without behavioral disturbance: Secondary | ICD-10-CM | POA: Diagnosis not present

## 2022-07-19 DIAGNOSIS — Z8781 Personal history of (healed) traumatic fracture: Secondary | ICD-10-CM | POA: Diagnosis not present

## 2022-07-19 DIAGNOSIS — Z8673 Personal history of transient ischemic attack (TIA), and cerebral infarction without residual deficits: Secondary | ICD-10-CM | POA: Diagnosis not present

## 2022-07-28 ENCOUNTER — Encounter (HOSPITAL_COMMUNITY): Payer: Self-pay

## 2022-07-28 ENCOUNTER — Other Ambulatory Visit: Payer: Self-pay

## 2022-07-28 ENCOUNTER — Emergency Department (HOSPITAL_COMMUNITY): Payer: Medicare HMO

## 2022-07-28 ENCOUNTER — Emergency Department (HOSPITAL_COMMUNITY)
Admission: EM | Admit: 2022-07-28 | Discharge: 2022-07-28 | Disposition: A | Discharge status: Home / Self Care | Payer: Medicare HMO | Attending: Emergency Medicine | Admitting: Emergency Medicine

## 2022-07-28 DIAGNOSIS — R55 Syncope and collapse: Secondary | ICD-10-CM | POA: Diagnosis not present

## 2022-07-28 DIAGNOSIS — F0390 Unspecified dementia without behavioral disturbance: Secondary | ICD-10-CM | POA: Diagnosis not present

## 2022-07-28 DIAGNOSIS — R519 Headache, unspecified: Secondary | ICD-10-CM | POA: Insufficient documentation

## 2022-07-28 DIAGNOSIS — Z7982 Long term (current) use of aspirin: Secondary | ICD-10-CM | POA: Diagnosis not present

## 2022-07-28 LAB — CBC WITH DIFFERENTIAL/PLATELET
Abs Immature Granulocytes: 0.01 10*3/uL (ref 0.00–0.07)
Basophils Absolute: 0 10*3/uL (ref 0.0–0.1)
Basophils Relative: 0 %
Eosinophils Absolute: 0 10*3/uL (ref 0.0–0.5)
Eosinophils Relative: 1 %
HCT: 35.6 % — ABNORMAL LOW (ref 39.0–52.0)
Hemoglobin: 11.4 g/dL — ABNORMAL LOW (ref 13.0–17.0)
Immature Granulocytes: 0 %
Lymphocytes Relative: 26 %
Lymphs Abs: 1.8 10*3/uL (ref 0.7–4.0)
MCH: 29.4 pg (ref 26.0–34.0)
MCHC: 32 g/dL (ref 30.0–36.0)
MCV: 91.8 fL (ref 80.0–100.0)
Monocytes Absolute: 0.4 10*3/uL (ref 0.1–1.0)
Monocytes Relative: 6 %
Neutro Abs: 4.6 10*3/uL (ref 1.7–7.7)
Neutrophils Relative %: 67 %
Platelets: 220 10*3/uL (ref 150–400)
RBC: 3.88 MIL/uL — ABNORMAL LOW (ref 4.22–5.81)
RDW: 17.2 % — ABNORMAL HIGH (ref 11.5–15.5)
WBC: 6.9 10*3/uL (ref 4.0–10.5)
nRBC: 0 % (ref 0.0–0.2)

## 2022-07-28 LAB — COMPREHENSIVE METABOLIC PANEL
ALT: 8 U/L (ref 0–44)
AST: 13 U/L — ABNORMAL LOW (ref 15–41)
Albumin: 3.1 g/dL — ABNORMAL LOW (ref 3.5–5.0)
Alkaline Phosphatase: 101 U/L (ref 38–126)
Anion gap: 10 (ref 5–15)
BUN: 13 mg/dL (ref 8–23)
CO2: 22 mmol/L (ref 22–32)
Calcium: 9.1 mg/dL (ref 8.9–10.3)
Chloride: 104 mmol/L (ref 98–111)
Creatinine, Ser: 1.25 mg/dL — ABNORMAL HIGH (ref 0.61–1.24)
GFR, Estimated: 57 mL/min — ABNORMAL LOW (ref >60)
Glucose, Bld: 225 mg/dL — ABNORMAL HIGH (ref 70–99)
Potassium: 4.1 mmol/L (ref 3.5–5.1)
Sodium: 136 mmol/L (ref 135–145)
Total Bilirubin: 0.8 mg/dL (ref 0.3–1.2)
Total Protein: 6.8 g/dL (ref 6.5–8.1)

## 2022-07-28 MED ORDER — SODIUM CHLORIDE 0.9 % IV BOLUS
1000.0000 mL | Freq: Once | INTRAVENOUS | Status: AC
  Administered 2022-07-28: 1000 mL via INTRAVENOUS

## 2022-07-28 NOTE — ED Provider Notes (Signed)
[place] Provider Note   CSN: [id number] Arrival date & time: 07/28/22  1310     History  Chief Complaint  Patient presents with   Near Syncope    Mike Mack is a 87 y.o. male.  87 yo M with a chief complaints of a syncopal event.  The patient was eating breakfast this morning and his wife noted that he was not responding to her.  She started yelling at him and thinks it lasted about 10 minutes.  He at 1 point described that he was having a headache and she called 911.  When they arrived his blood pressure was a bit on the low side.  On arrival here the patient feels completely better he denies headache.  He denies head injury or neck pain.  Denies chest pain difficulty breathing abdominal pain.  Feels like he has been eating and drinking normally the past couple days.   Near Syncope       Home Medications Prior to Admission medications   Medication Sig Start Date End Date Taking? Authorizing Provider  acetaminophen (TYLENOL) 325 MG tablet Take 2 tablets (650 mg total) by mouth every 6 (six) hours as needed for mild pain (or Fever >/= 101). 11/27/20   Debbe Odea, MD  aspirin EC 81 MG tablet Take 1 tablet (81 mg total) by mouth daily. Swallow whole. 04/06/22   Little Ishikawa, MD  atorvastatin (LIPITOR) 20 MG tablet Take 20 mg by mouth every evening.  11/03/13   [provider]  B Complex Vitamins (B COMPLEX PO) Take 1 tablet by mouth at bedtime.    [provider]  brimonidine (ALPHAGAN) 0.2 % ophthalmic solution Place 1 drop into both eyes 3 (three) times daily.    [provider]  cholecalciferol (VITAMIN D3) 10 MCG (400 UNIT) TABS tablet Take 400 Units by mouth daily.    [provider]  docusate sodium (COLACE) 100 MG capsule Take 1 capsule (100 mg total) by mouth 2 (two) times daily as needed for mild constipation. 03/09/22   Geradine Girt, DO  dorzolamide-timolol (COSOPT) 22.3-6.8  MG/ML ophthalmic solution Place 1 drop into both eyes 2 (two) times daily. 03/04/20   [provider]  feeding supplement, GLUCERNA SHAKE, (GLUCERNA SHAKE) LIQD Take 237 mLs by mouth 3 (three) times daily between meals. Patient taking differently: Take 237 mLs by mouth 2 (two) times daily between meals. 03/25/20   Antonieta Pert, MD  Finerenone (KERENDIA) 20 MG TABS Take 10 mg by mouth daily.    [provider]  latanoprost (XALATAN) 0.005 % ophthalmic solution Place 1 drop into both eyes at bedtime.  11/17/14   [provider]  levothyroxine (SYNTHROID) 88 MCG tablet Take 88 mcg by mouth daily before breakfast.    [provider]  memantine (NAMENDA) 10 MG tablet Take 10 mg by mouth in the morning and at bedtime. 11/04/20   [provider]  mirtazapine (REMERON SOL-TAB) 15 MG disintegrating tablet Take 1 tablet (15 mg total) by mouth at bedtime. 03/09/22   Geradine Girt, DO  olmesartan (BENICAR) 20 MG tablet Take 20 mg by mouth daily.    [provider]  pantoprazole (PROTONIX) 40 MG tablet Take 1 tablet (40 mg total) by mouth daily. 07/08/22   Dion Saucier A, PA  polyethylene glycol (MIRALAX / GLYCOLAX) packet Take 17 g by mouth daily. Patient taking differently: Take 17 g by mouth daily as needed for mild  constipation. 09/06/13   [name], MD  RHOPRESSA 0.02 % SOLN Place 1 drop into both eyes at bedtime. 03/05/20   [provider]  Tamsulosin HCl (FLOMAX) 0.4 MG CAPS Take 0.4 mg by mouth in the morning and at bedtime.    [provider]      Allergies    Metformin and related and Soliqua [insulin glargine-lixisenatide]    Review of Systems   Review of Systems  Cardiovascular:  Positive for near-syncope.    Physical Exam Updated Vital Signs BP 130/67   Pulse 60   Temp (!) 97.3 F (36.3 C) (Oral)   Resp 12   Ht 5' 5"$  (1.651 m)   Wt 81 kg   SpO2 99%   BMI 29.72 kg/m  Physical Exam Vitals and nursing note  reviewed.  Constitutional:      Appearance: He is well-developed.  HENT:     Head: Normocephalic and atraumatic.  Eyes:     Pupils: Pupils are equal, round, and reactive to light.  Neck:     Vascular: No JVD.  Cardiovascular:     Rate and Rhythm: Normal rate and regular rhythm.     Heart sounds: No murmur heard.    No friction rub. No gallop.  Pulmonary:     Effort: No respiratory distress.     Breath sounds: No wheezing.  Abdominal:     General: There is no distension.     Tenderness: There is no abdominal tenderness. There is no guarding or rebound.  Musculoskeletal:        General: Normal range of motion.     Cervical back: Normal range of motion and neck supple.  Skin:    Coloration: Skin is not pale.     Findings: No rash.  Neurological:     Mental Status: He is alert and oriented to person, place, and time.  Psychiatric:        Behavior: Behavior normal.     ED Results / Procedures / Treatments   Labs (all labs ordered are listed, but only abnormal results are displayed) Labs Reviewed  CBC WITH DIFFERENTIAL/PLATELET  COMPREHENSIVE METABOLIC PANEL    EKG EKG Interpretation  Date/Time:  Friday July 28 2022 13:27:35 EST Ventricular Rate:  59 PR Interval:  190 QRS Duration: 93 QT Interval:  446 QTC Calculation: 442 R Axis:   29 Text Interpretation: Sinus rhythm Abnormal R-wave progression, early transition No significant change since last tracing Confirmed by Deno Etienne [phone]) on 07/28/2022 2:24:03 PM  Radiology CT Head Wo Contrast  Result Date: 07/28/2022 CLINICAL DATA:  Headache, sudden, severe. EXAM: CT HEAD WITHOUT CONTRAST TECHNIQUE: Contiguous axial images were obtained from the base of the skull through the vertex without intravenous contrast. RADIATION DOSE REDUCTION: This exam was performed according to the departmental dose-optimization program which includes automated exposure control, adjustment of the mA and/or kV according to patient size  and/or use of iterative reconstruction technique. COMPARISON:  Head CT 04/01/2022. FINDINGS: Brain: No acute intracranial hemorrhage. Unchanged severe chronic small-vessel disease. Gray-white differentiation is otherwise preserved. Unchanged disproportionate enlargement of the lateral and third ventricles with acute callosal angle and elevated Evans index. No extra-axial collection. Vascular: No hyperdense vessel or unexpected calcification. Skull: No calvarial fracture or suspicious bone lesion. Skull base is unremarkable. Sinuses/Orbits: Unremarkable. Other: None. IMPRESSION: 1. No acute intracranial abnormality. 2. Unchanged disproportionate enlargement of the lateral and third ventricles with acute callosal angle and elevated Evans index. This can be seen with idiopathic  normal pressure hydrocephalus in the appropriate clinical setting. 3. Unchanged severe chronic small-vessel disease. Electronically Signed   By: Emmit Alexanders M.D.   On: 07/28/2022 15:12    Procedures Procedures    Medications Ordered in ED Medications  sodium chloride 0.9 % bolus 1,000 mL (1,000 mLs Intravenous New Bag/Given 07/28/22 1446)    ED Course/ Medical Decision Making/ A&P Clinical Course as of 07/28/22 1525  Fri Jul 28, 2022  1514 Stable 64 YOM with syncopal episode at bedside. Was hypotensive and headache, now  both resolved.  Likely OP [CC]    Clinical Course User Index [CC] Tretha Sciara, MD                             Medical Decision Making Amount and/or Complexity of Data Reviewed Labs: ordered. Radiology: ordered.   87 yo M with a significant past medical history of dementia comes in with a chief complaints of syncopal event.  This was witnessed by the wife and she provides most of the history.  He reportedly had a headache earlier.  He has trouble describing this.  He is does not think he had 1 yesterday.  No headache now.  He denies any other specific symptoms.  Sounds vasovagal with a low  blood pressure on scene and complete resolution without intervention.  Will give a bolus of IV fluids here.  Check blood work.  CT of the head with reported headache and syncope.  Reassess. Signed out to Dr. Oswald Hillock, awaiting CT and labs.  The patients results and plan were reviewed and discussed.   Any x-rays performed were independently reviewed by myself.   Differential diagnosis were considered with the presenting HPI.  Medications  sodium chloride 0.9 % bolus 1,000 mL (1,000 mLs Intravenous New Bag/Given 07/28/22 1446)    Vitals:   07/28/22 1324 07/28/22 1327 07/28/22 1400 07/28/22 1430  BP: 136/73  117/67 130/67  Pulse: (!) 56  62 60  Resp: 11  12 12  $ Temp: (!) 97.3 F (36.3 C)     TempSrc: Oral     SpO2: 100%  99% 99%  Weight:  81 kg    Height:  5' 5"$  (1.651 m)      Final diagnoses:  Syncope and collapse            Final Clinical Impression(s) / ED Diagnoses Final diagnoses:  Syncope and collapse    Rx / DC Orders ED Discharge Orders     None         Deno Etienne, DO 07/28/22 1525

## 2022-07-28 NOTE — ED Provider Notes (Signed)
Clinical Course as of 07/28/22 1522  Fri Jul 28, 2022  1514 Stable 87 YOM with syncopal episode at bedside. Was hypotensive and headache, now  both resolved.  Likely OP [CC]    Clinical Course User Index [CC] Tretha Sciara, MD   Reevaluated at bedside.  All symptoms now resolved.  Patient feels completely comfortable at this time and is requesting discharge. Had a shared medical decision making conversation with patient and wife at bedside.  Offered hospital observation for most conservative course of action given complete syncope, advanced age, symptoms leading into it. However family declined..  They state that they feel comfortable taking care of him at home and would rather follow-up with primary care provider in the outpatient setting.  This is reasonable given resolution of symptoms, well appearance, episode of prodrome prior to the syncope and low blood pressure initially on EMS arrival that resolved with IV fluids.  Still favor orthostasis based on this description but cardiac syncope is not completely ruled out at this time.  Family informed of this and will plan to follow close with outpatient provider return if symptoms recur.   Tretha Sciara, MD 07/28/22 [phone]

## 2022-07-28 NOTE — ED Triage Notes (Signed)
Pt bib ems from home c/o syncope episodes. Pt baseline Aox3 with baseline dementia. Hx Mini-strokes Upon arrival that pt was hypotensive.   BP 126/79 HR 50's RA 90%% CBG 208 RR 26  Pt denies cardiac hx.

## 2022-08-08 ENCOUNTER — Emergency Department (HOSPITAL_COMMUNITY): Payer: Medicare HMO

## 2022-08-08 ENCOUNTER — Other Ambulatory Visit: Payer: Self-pay

## 2022-08-08 ENCOUNTER — Inpatient Hospital Stay (HOSPITAL_COMMUNITY)
Admission: EM | Admit: 2022-08-08 | Discharge: 2022-08-11 | DRG: 071 | Disposition: A | Discharge status: Home / Self Care | Payer: Medicare HMO | Attending: Internal Medicine | Admitting: Internal Medicine

## 2022-08-08 ENCOUNTER — Encounter (HOSPITAL_COMMUNITY): Payer: Self-pay

## 2022-08-08 DIAGNOSIS — Z9049 Acquired absence of other specified parts of digestive tract: Secondary | ICD-10-CM

## 2022-08-08 DIAGNOSIS — F03C11 Unspecified dementia, severe, with agitation: Secondary | ICD-10-CM | POA: Diagnosis not present

## 2022-08-08 DIAGNOSIS — E1122 Type 2 diabetes mellitus with diabetic chronic kidney disease: Secondary | ICD-10-CM | POA: Diagnosis present

## 2022-08-08 DIAGNOSIS — I959 Hypotension, unspecified: Secondary | ICD-10-CM | POA: Diagnosis not present

## 2022-08-08 DIAGNOSIS — D631 Anemia in chronic kidney disease: Secondary | ICD-10-CM | POA: Diagnosis present

## 2022-08-08 DIAGNOSIS — Z8673 Personal history of transient ischemic attack (TIA), and cerebral infarction without residual deficits: Secondary | ICD-10-CM | POA: Diagnosis not present

## 2022-08-08 DIAGNOSIS — Z66 Do not resuscitate: Secondary | ICD-10-CM | POA: Diagnosis not present

## 2022-08-08 DIAGNOSIS — Z1152 Encounter for screening for COVID-19: Secondary | ICD-10-CM

## 2022-08-08 DIAGNOSIS — G9340 Encephalopathy, unspecified: Secondary | ICD-10-CM | POA: Diagnosis not present

## 2022-08-08 DIAGNOSIS — R5383 Other fatigue: Secondary | ICD-10-CM | POA: Diagnosis present

## 2022-08-08 DIAGNOSIS — Z7982 Long term (current) use of aspirin: Secondary | ICD-10-CM | POA: Diagnosis not present

## 2022-08-08 DIAGNOSIS — N1831 Chronic kidney disease, stage 3a: Secondary | ICD-10-CM | POA: Diagnosis present

## 2022-08-08 DIAGNOSIS — Z79899 Other long term (current) drug therapy: Secondary | ICD-10-CM

## 2022-08-08 DIAGNOSIS — I6782 Cerebral ischemia: Secondary | ICD-10-CM | POA: Diagnosis not present

## 2022-08-08 DIAGNOSIS — N400 Enlarged prostate without lower urinary tract symptoms: Secondary | ICD-10-CM | POA: Diagnosis not present

## 2022-08-08 DIAGNOSIS — N183 Chronic kidney disease, stage 3 unspecified: Secondary | ICD-10-CM | POA: Diagnosis not present

## 2022-08-08 DIAGNOSIS — F0390 Unspecified dementia without behavioral disturbance: Secondary | ICD-10-CM | POA: Diagnosis not present

## 2022-08-08 DIAGNOSIS — Z8612 Personal history of poliomyelitis: Secondary | ICD-10-CM | POA: Diagnosis present

## 2022-08-08 DIAGNOSIS — Z8249 Family history of ischemic heart disease and other diseases of the circulatory system: Secondary | ICD-10-CM

## 2022-08-08 DIAGNOSIS — Z79890 Hormone replacement therapy (postmenopausal): Secondary | ICD-10-CM | POA: Diagnosis not present

## 2022-08-08 DIAGNOSIS — E119 Type 2 diabetes mellitus without complications: Secondary | ICD-10-CM | POA: Diagnosis not present

## 2022-08-08 DIAGNOSIS — Z7401 Bed confinement status: Secondary | ICD-10-CM | POA: Diagnosis not present

## 2022-08-08 DIAGNOSIS — H409 Unspecified glaucoma: Secondary | ICD-10-CM | POA: Diagnosis present

## 2022-08-08 DIAGNOSIS — Z87440 Personal history of urinary (tract) infections: Secondary | ICD-10-CM | POA: Diagnosis not present

## 2022-08-08 DIAGNOSIS — I10 Essential (primary) hypertension: Secondary | ICD-10-CM | POA: Diagnosis present

## 2022-08-08 DIAGNOSIS — G9341 Metabolic encephalopathy: Secondary | ICD-10-CM | POA: Diagnosis not present

## 2022-08-08 DIAGNOSIS — J90 Pleural effusion, not elsewhere classified: Secondary | ICD-10-CM | POA: Diagnosis not present

## 2022-08-08 DIAGNOSIS — Z794 Long term (current) use of insulin: Secondary | ICD-10-CM

## 2022-08-08 DIAGNOSIS — N390 Urinary tract infection, site not specified: Secondary | ICD-10-CM | POA: Diagnosis not present

## 2022-08-08 DIAGNOSIS — M1990 Unspecified osteoarthritis, unspecified site: Secondary | ICD-10-CM | POA: Diagnosis present

## 2022-08-08 DIAGNOSIS — K922 Gastrointestinal hemorrhage, unspecified: Secondary | ICD-10-CM | POA: Diagnosis present

## 2022-08-08 DIAGNOSIS — R531 Weakness: Secondary | ICD-10-CM | POA: Diagnosis not present

## 2022-08-08 DIAGNOSIS — R410 Disorientation, unspecified: Secondary | ICD-10-CM | POA: Diagnosis not present

## 2022-08-08 DIAGNOSIS — Z993 Dependence on wheelchair: Secondary | ICD-10-CM

## 2022-08-08 DIAGNOSIS — I129 Hypertensive chronic kidney disease with stage 1 through stage 4 chronic kidney disease, or unspecified chronic kidney disease: Secondary | ICD-10-CM | POA: Diagnosis present

## 2022-08-08 DIAGNOSIS — D649 Anemia, unspecified: Secondary | ICD-10-CM | POA: Diagnosis not present

## 2022-08-08 DIAGNOSIS — Z888 Allergy status to other drugs, medicaments and biological substances status: Secondary | ICD-10-CM | POA: Diagnosis not present

## 2022-08-08 DIAGNOSIS — G8314 Monoplegia of lower limb affecting left nondominant side: Secondary | ICD-10-CM | POA: Diagnosis present

## 2022-08-08 DIAGNOSIS — Z833 Family history of diabetes mellitus: Secondary | ICD-10-CM

## 2022-08-08 DIAGNOSIS — I639 Cerebral infarction, unspecified: Secondary | ICD-10-CM | POA: Diagnosis not present

## 2022-08-08 LAB — CBC WITH DIFFERENTIAL/PLATELET
Abs Immature Granulocytes: 0.02 10*3/uL (ref 0.00–0.07)
Basophils Absolute: 0 10*3/uL (ref 0.0–0.1)
Basophils Relative: 1 %
Eosinophils Absolute: 0 10*3/uL (ref 0.0–0.5)
Eosinophils Relative: 1 %
HCT: 38.3 % — ABNORMAL LOW (ref 39.0–52.0)
Hemoglobin: 12.3 g/dL — ABNORMAL LOW (ref 13.0–17.0)
Immature Granulocytes: 0 %
Lymphocytes Relative: 28 %
Lymphs Abs: 1.9 10*3/uL (ref 0.7–4.0)
MCH: 29.5 pg (ref 26.0–34.0)
MCHC: 32.1 g/dL (ref 30.0–36.0)
MCV: 91.8 fL (ref 80.0–100.0)
Monocytes Absolute: 0.4 10*3/uL (ref 0.1–1.0)
Monocytes Relative: 6 %
Neutro Abs: 4.4 10*3/uL (ref 1.7–7.7)
Neutrophils Relative %: 64 %
Platelets: 265 10*3/uL (ref 150–400)
RBC: 4.17 MIL/uL — ABNORMAL LOW (ref 4.22–5.81)
RDW: 17.8 % — ABNORMAL HIGH (ref 11.5–15.5)
WBC: 6.8 10*3/uL (ref 4.0–10.5)
nRBC: 0 % (ref 0.0–0.2)

## 2022-08-08 LAB — COMPREHENSIVE METABOLIC PANEL
ALT: 9 U/L (ref 0–44)
AST: 11 U/L — ABNORMAL LOW (ref 15–41)
Albumin: 3.3 g/dL — ABNORMAL LOW (ref 3.5–5.0)
Alkaline Phosphatase: 106 U/L (ref 38–126)
Anion gap: 8 (ref 5–15)
BUN: 16 mg/dL (ref 8–23)
CO2: 24 mmol/L (ref 22–32)
Calcium: 9.4 mg/dL (ref 8.9–10.3)
Chloride: 106 mmol/L (ref 98–111)
Creatinine, Ser: 1.25 mg/dL — ABNORMAL HIGH (ref 0.61–1.24)
GFR, Estimated: 57 mL/min — ABNORMAL LOW (ref >60)
Glucose, Bld: 124 mg/dL — ABNORMAL HIGH (ref 70–99)
Potassium: 4.1 mmol/L (ref 3.5–5.1)
Sodium: 138 mmol/L (ref 135–145)
Total Bilirubin: 0.6 mg/dL (ref 0.3–1.2)
Total Protein: 7.6 g/dL (ref 6.5–8.1)

## 2022-08-08 LAB — URINALYSIS, ROUTINE W REFLEX MICROSCOPIC
Bacteria, UA: NONE SEEN
Bilirubin Urine: NEGATIVE
Glucose, UA: NEGATIVE mg/dL
Ketones, ur: 5 mg/dL — AB
Nitrite: NEGATIVE
Protein, ur: 30 mg/dL — AB
RBC / HPF: >50 RBC/hpf (ref 0–5)
Specific Gravity, Urine: 1.016 (ref 1.005–1.030)
WBC, UA: >50 WBC/hpf (ref 0–5)
pH: 5 (ref 5.0–8.0)

## 2022-08-08 LAB — AMMONIA: Ammonia: 15 umol/L (ref 9–35)

## 2022-08-08 LAB — I-STAT VENOUS BLOOD GAS, ED
Acid-base deficit: 3 mmol/L — ABNORMAL HIGH (ref 0.0–2.0)
Bicarbonate: 22.5 mmol/L (ref 20.0–28.0)
Calcium, Ion: 1.22 mmol/L (ref 1.15–1.40)
HCT: 39 % (ref 39.0–52.0)
Hemoglobin: 13.3 g/dL (ref 13.0–17.0)
O2 Saturation: 79 %
Potassium: 4.2 mmol/L (ref 3.5–5.1)
Sodium: 142 mmol/L (ref 135–145)
TCO2: 24 mmol/L (ref 22–32)
pCO2, Ven: 39.4 mmHg — ABNORMAL LOW (ref 44–60)
pH, Ven: 7.366 (ref 7.25–7.43)
pO2, Ven: 45 mmHg (ref 32–45)

## 2022-08-08 LAB — RESP PANEL BY RT-PCR (RSV, FLU A&B, COVID)  RVPGX2
Influenza A by PCR: NEGATIVE
Influenza B by PCR: NEGATIVE
Resp Syncytial Virus by PCR: NEGATIVE
SARS Coronavirus 2 by RT PCR: NEGATIVE

## 2022-08-08 LAB — GLUCOSE, CAPILLARY
Glucose-Capillary: 100 mg/dL — ABNORMAL HIGH (ref 70–99)
Glucose-Capillary: 87 mg/dL (ref 70–99)

## 2022-08-08 LAB — TSH: TSH: 1.051 u[IU]/mL (ref 0.350–4.500)

## 2022-08-08 LAB — MAGNESIUM: Magnesium: 1.9 mg/dL (ref 1.7–2.4)

## 2022-08-08 LAB — ETHANOL: Alcohol, Ethyl (B): <10 mg/dL (ref <10)

## 2022-08-08 LAB — CBG MONITORING, ED
Glucose-Capillary: 108 mg/dL — ABNORMAL HIGH (ref 70–99)
Glucose-Capillary: 86 mg/dL (ref 70–99)

## 2022-08-08 LAB — TROPONIN I (HIGH SENSITIVITY)
Troponin I (High Sensitivity): 7 ng/L (ref <18)
Troponin I (High Sensitivity): 6 ng/L (ref <18)

## 2022-08-08 MED ORDER — NETARSUDIL DIMESYLATE 0.02 % OP SOLN: 1.0000 [drp] | Freq: Every day | OPHTHALMIC | Status: DC

## 2022-08-08 MED ORDER — TAMSULOSIN HCL 0.4 MG PO CAPS
0.4000 mg | ORAL_CAPSULE | Freq: Every day | ORAL | Status: DC
  Administered 2022-08-09 – 2022-08-11 (×3): 0.4 mg via ORAL
  Filled 2022-08-08 (×3): qty 1

## 2022-08-08 MED ORDER — POLYETHYLENE GLYCOL 3350 17 G PO PACK: 17.0000 g | PACK | Freq: Every day | ORAL | Status: DC | PRN

## 2022-08-08 MED ORDER — SODIUM CHLORIDE 0.9 % IV SOLN
1.0000 g | Freq: Once | INTRAVENOUS | Status: AC
  Administered 2022-08-08: 1 g via INTRAVENOUS
  Filled 2022-08-08: qty 10

## 2022-08-08 MED ORDER — DORZOLAMIDE HCL-TIMOLOL MAL 2-0.5 % OP SOLN
1.0000 [drp] | Freq: Two times a day (BID) | OPHTHALMIC | Status: DC
  Administered 2022-08-08 – 2022-08-11 (×6): 1 [drp] via OPHTHALMIC
  Filled 2022-08-08: qty 10

## 2022-08-08 MED ORDER — LATANOPROST 0.005 % OP SOLN
1.0000 [drp] | Freq: Every day | OPHTHALMIC | Status: DC
  Administered 2022-08-08 – 2022-08-11 (×3): 1 [drp] via OPHTHALMIC
  Filled 2022-08-08 (×2): qty 2.5

## 2022-08-08 MED ORDER — MEMANTINE HCL 10 MG PO TABS
10.0000 mg | ORAL_TABLET | Freq: Two times a day (BID) | ORAL | Status: DC
  Administered 2022-08-08 – 2022-08-11 (×6): 10 mg via ORAL
  Filled 2022-08-08 (×6): qty 1

## 2022-08-08 MED ORDER — GLUCERNA SHAKE PO LIQD
237.0000 mL | Freq: Two times a day (BID) | ORAL | Status: DC
  Administered 2022-08-09 – 2022-08-11 (×4): 237 mL via ORAL

## 2022-08-08 MED ORDER — ACETAMINOPHEN 325 MG PO TABS
650.0000 mg | ORAL_TABLET | Freq: Four times a day (QID) | ORAL | Status: DC | PRN
  Administered 2022-08-09: 650 mg via ORAL
  Filled 2022-08-08: qty 2

## 2022-08-08 MED ORDER — INSULIN ASPART 100 UNIT/ML IJ SOLN
0.0000 [IU] | Freq: Three times a day (TID) | INTRAMUSCULAR | Status: DC
  Administered 2022-08-09 – 2022-08-10 (×3): 2 [IU] via SUBCUTANEOUS

## 2022-08-08 MED ORDER — SODIUM CHLORIDE 0.9% FLUSH
3.0000 mL | Freq: Two times a day (BID) | INTRAVENOUS | Status: DC
  Administered 2022-08-08 – 2022-08-11 (×5): 3 mL via INTRAVENOUS

## 2022-08-08 MED ORDER — PANTOPRAZOLE SODIUM 40 MG PO TBEC
40.0000 mg | DELAYED_RELEASE_TABLET | Freq: Every day | ORAL | Status: DC
  Administered 2022-08-09 – 2022-08-11 (×3): 40 mg via ORAL
  Filled 2022-08-08 (×3): qty 1

## 2022-08-08 MED ORDER — ENOXAPARIN SODIUM 40 MG/0.4ML IJ SOSY
40.0000 mg | PREFILLED_SYRINGE | INTRAMUSCULAR | Status: DC
  Administered 2022-08-08 – 2022-08-10 (×3): 40 mg via SUBCUTANEOUS
  Filled 2022-08-08 (×3): qty 0.4

## 2022-08-08 MED ORDER — ATORVASTATIN CALCIUM 10 MG PO TABS
20.0000 mg | ORAL_TABLET | Freq: Every evening | ORAL | Status: DC
  Administered 2022-08-08 – 2022-08-10 (×3): 20 mg via ORAL
  Filled 2022-08-08 (×3): qty 2

## 2022-08-08 MED ORDER — ASPIRIN 81 MG PO TBEC
81.0000 mg | DELAYED_RELEASE_TABLET | Freq: Every day | ORAL | Status: DC
  Administered 2022-08-08 – 2022-08-11 (×4): 81 mg via ORAL
  Filled 2022-08-08 (×4): qty 1

## 2022-08-08 MED ORDER — SODIUM CHLORIDE 0.9 % IV BOLUS (SEPSIS)
500.0000 mL | Freq: Once | INTRAVENOUS | Status: AC
  Administered 2022-08-08: 500 mL via INTRAVENOUS

## 2022-08-08 MED ORDER — MIRTAZAPINE 15 MG PO TBDP
15.0000 mg | ORAL_TABLET | Freq: Every day | ORAL | Status: DC
  Administered 2022-08-08 – 2022-08-10 (×3): 15 mg via ORAL
  Filled 2022-08-08 (×3): qty 1

## 2022-08-08 MED ORDER — BRIMONIDINE TARTRATE 0.2 % OP SOLN
1.0000 [drp] | Freq: Three times a day (TID) | OPHTHALMIC | Status: DC
  Administered 2022-08-08 – 2022-08-11 (×8): 1 [drp] via OPHTHALMIC
  Filled 2022-08-08 (×2): qty 5

## 2022-08-08 MED ORDER — ACETAMINOPHEN 650 MG RE SUPP: 650.0000 mg | Freq: Four times a day (QID) | RECTAL | Status: DC | PRN

## 2022-08-08 MED ORDER — LEVOTHYROXINE SODIUM 88 MCG PO TABS
88.0000 ug | ORAL_TABLET | Freq: Every day | ORAL | Status: DC
  Administered 2022-08-09 – 2022-08-11 (×3): 88 ug via ORAL
  Filled 2022-08-08 (×3): qty 1

## 2022-08-08 MED ORDER — IRBESARTAN 300 MG PO TABS
150.0000 mg | ORAL_TABLET | Freq: Every day | ORAL | Status: DC
  Administered 2022-08-09: 150 mg via ORAL
  Filled 2022-08-08: qty 1

## 2022-08-08 NOTE — Code Documentation (Signed)
Stroke Response Nurse Documentation Code Documentation  Mike Mack is a 87 y.o. male arriving to [facility]  via [facility] on 2/20 with past medical hx of hypertension, polio, dementia, previous CVA. On No antithrombotic. Code stroke was activated by ED.   Patient from home where he was LKW at 1800 and now complaining of lower extremity weakness and altered mental status . Pt's wife found him this morning to be more lethargic than normal and weaker than his baseline in BLE.    Stroke team at the bedside on patient arrival. Labs drawn and patient cleared for CT by Dr. Nechama Guard. Patient to CT with team. NIHSS 5, see documentation for details and code stroke times. Patient with disoriented and right leg weakness on exam. The following imaging was completed:  CT Head. Patient is not a candidate for IV Thrombolytic due to no stroke suspected. Patient is not not a candidate for IR due to no stroke suspected. Dr. Nechama Guard initially activated code stroke but then spoke with family and gathered more details about patient's baseline and subsequently cancelled code stroke while patient still in CT.   Care Plan: cancel code stroke.   Bedside handoff with ED RN Luetta Nutting RN.    Lilly [place]  Stroke Response RN

## 2022-08-08 NOTE — Plan of Care (Signed)
  Problem: Education: Goal: Knowledge of General Education information will improve Description Including pain rating scale, medication(s)/side effects and non-pharmacologic comfort measures Outcome: Progressing   

## 2022-08-08 NOTE — Plan of Care (Signed)

## 2022-08-08 NOTE — ED Provider Notes (Signed)
[facility] Provider Note   CSN: [id number] Arrival date & time: 08/08/22  [id number]  An emergency department physician performed an initial assessment on this suspected stroke patient at 1034.  History  Chief Complaint  Patient presents with   Fatigue    Mike Mack is a 87 y.o. male.  With PMH of DM2, HTN, CKD, CVA, dementia BIB EMS from home for increased lethargy and not acting as himself.  Last known normal 6 PM yesterday.  Of note has chronic lower extremity weakness worse on the right practically no movement of right lower extremity.  Wife said he was more difficult to wake up this morning and was more confused than usual.  She notes generally more somnolence than baseline.  he would only really answer yes or no questions did not eat much breakfast and had some coughing with breakfast.  He has had history of UTIs and thought this may be going on.  He had been otherwise acting at baseline.  Was seen about 10 days prior in the ED for syncopal episode.  Patient does live at home.  His caretakers are his wife and grandson.  Patient has dementia at baseline.  He is not the best historian.  History taking is limited.  He is unsure why he is here.  He is denying any pain, nausea, vomiting or any other medical complaints.  HPI     Home Medications Prior to Admission medications   Medication Sig Start Date End Date Taking? Authorizing Provider  acetaminophen (TYLENOL) 325 MG tablet Take 2 tablets (650 mg total) by mouth every 6 (six) hours as needed for mild pain (or Fever >/= 101). 11/27/20   Debbe Odea, MD  aspirin EC 81 MG tablet Take 1 tablet (81 mg total) by mouth daily. Swallow whole. 04/06/22   Little Ishikawa, MD  atorvastatin (LIPITOR) 20 MG tablet Take 20 mg by mouth every evening.  11/03/13   [provider]  B Complex Vitamins (B COMPLEX PO) Take 1 tablet by mouth at bedtime.    [provider]  brimonidine  (ALPHAGAN) 0.2 % ophthalmic solution Place 1 drop into both eyes 3 (three) times daily.    [provider]  cholecalciferol (VITAMIN D3) 10 MCG (400 UNIT) TABS tablet Take 400 Units by mouth daily.    [provider]  docusate sodium (COLACE) 100 MG capsule Take 1 capsule (100 mg total) by mouth 2 (two) times daily as needed for mild constipation. 03/09/22   Geradine Girt, DO  dorzolamide-timolol (COSOPT) 22.3-6.8 MG/ML ophthalmic solution Place 1 drop into both eyes 2 (two) times daily. 03/04/20   [provider]  feeding supplement, GLUCERNA SHAKE, (GLUCERNA SHAKE) LIQD Take 237 mLs by mouth 3 (three) times daily between meals. Patient taking differently: Take 237 mLs by mouth 2 (two) times daily between meals. 03/25/20   Antonieta Pert, MD  Finerenone (KERENDIA) 20 MG TABS Take 10 mg by mouth daily.    [provider]  latanoprost (XALATAN) 0.005 % ophthalmic solution Place 1 drop into both eyes at bedtime.  11/17/14   [provider]  levothyroxine (SYNTHROID) 88 MCG tablet Take 88 mcg by mouth daily before breakfast.    [provider]  memantine (NAMENDA) 10 MG tablet Take 10 mg by mouth in the morning and at bedtime. 11/04/20   [provider]  mirtazapine (REMERON SOL-TAB) 15 MG disintegrating tablet Take 1 tablet (15 mg total) by mouth at  bedtime. 03/09/22   Geradine Girt, DO  olmesartan (BENICAR) 20 MG tablet Take 20 mg by mouth daily.    [provider]  pantoprazole (PROTONIX) 40 MG tablet Take 1 tablet (40 mg total) by mouth daily. 07/08/22   Dion Saucier A, PA  polyethylene glycol (MIRALAX / GLYCOLAX) packet Take 17 g by mouth daily. Patient taking differently: Take 17 g by mouth daily as needed for mild constipation. 09/06/13   [name], MD  RHOPRESSA 0.02 % SOLN Place 1 drop into both eyes at bedtime. 03/05/20   [provider]  Tamsulosin HCl (FLOMAX) 0.4 MG CAPS Take 0.4 mg by mouth in the morning  and at bedtime.    [provider]      Allergies    Metformin and related and Soliqua [insulin glargine-lixisenatide]    Review of Systems   Review of Systems  Physical Exam Updated Vital Signs BP 117/85   Pulse 70   Temp 98.3 F (36.8 C) (Oral)   Resp 16   SpO2 100%  Physical Exam Constitutional: Alert and oriented to person and place not time or situation.  Chronically ill-appearing but no acute distress Eyes: Conjunctivae are normal. ENT      Head: Normocephalic and atraumatic.      Nose: No congestion.      Mouth/Throat: Mucous membranes are dry with small thick sputum at corner of mouth.      Neck: No stridor. Cardiovascular: S1, S2, regular rate and rhythm Respiratory: Normal respiratory effort. Breath sounds are normal.  O2 sat 100 on RA Gastrointestinal: Soft and nontender.  Musculoskeletal:  Atrophy of bilateral lower extremities worse in right lower extremity Neurologic: Mildly confused, oriented to person and place.  No movement of right lower extremity.  Some drift against gravity of the left lower extremity.  No drift of bilateral upper extremities.  PERRL. Skin: Skin is warm, dry and intact. No rash noted. Psychiatric: Mood and affect are normal. Speech and behavior are normal.  ED Results / Procedures / Treatments   Labs (all labs ordered are listed, but only abnormal results are displayed) Labs Reviewed  COMPREHENSIVE METABOLIC PANEL - Abnormal; Notable for the following components:      Result Value   Glucose, Bld 124 (*)    Creatinine, Ser 1.25 (*)    Albumin 3.3 (*)    AST 11 (*)    GFR, Estimated 57 (*)    All other components within normal limits  CBC WITH DIFFERENTIAL/PLATELET - Abnormal; Notable for the following components:   RBC 4.17 (*)    Hemoglobin 12.3 (*)    HCT 38.3 (*)    RDW 17.8 (*)    All other components within normal limits  URINALYSIS, ROUTINE W REFLEX MICROSCOPIC - Abnormal; Notable for the following components:    APPearance HAZY (*)    Hgb urine dipstick LARGE (*)    Ketones, ur 5 (*)    Protein, ur 30 (*)    Leukocytes,Ua LARGE (*)    All other components within normal limits  I-STAT VENOUS BLOOD GAS, ED - Abnormal; Notable for the following components:   pCO2, Ven 39.4 (*)    Acid-base deficit 3.0 (*)    All other components within normal limits  CBG MONITORING, ED - Abnormal; Notable for the following components:   Glucose-Capillary 108 (*)    All other components within normal limits  AMMONIA  ETHANOL  I-STAT CHEM 8, ED  CBG MONITORING, ED  TROPONIN  I (HIGH SENSITIVITY)  TROPONIN I (HIGH SENSITIVITY)    EKG None  Radiology CT HEAD CODE STROKE WO CONTRAST  Result Date: 08/08/2022 CLINICAL DATA:  Code stroke. EXAM: CT HEAD WITHOUT CONTRAST TECHNIQUE: Contiguous axial images were obtained from the base of the skull through the vertex without intravenous contrast. RADIATION DOSE REDUCTION: This exam was performed according to the departmental dose-optimization program which includes automated exposure control, adjustment of the mA and/or kV according to patient size and/or use of iterative reconstruction technique. COMPARISON:  CT head 07/28/2022 FINDINGS: Brain: There is no evidence of acute intracranial hemorrhage, extra-axial fluid collection, or acute territorial infarct. There is unchanged prominence of the ventricular system out of proportion to the degree of sulcal widening. Multiple remote infarcts in the bilateral cerebellar hemispheres and background chronic small-vessel ischemic change are stable. The pituitary and suprasellar region are normal. There is no mass lesion. There is no mass effect or midline shift. Vascular: No hyperdense vessel or unexpected calcification. Skull: Normal. Negative for fracture or focal lesion. Sinuses/Orbits: The imaged paranasal sinuses are clear. Bilateral lens implants are in place. The globes and orbits are otherwise unremarkable. Other: A right mastoid  effusion is unchanged. ASPECTS ([place] Stroke Program Early CT Score) - Ganglionic level infarction (caudate, lentiform nuclei, internal capsule, insula, M1-M3 cortex): 7 - Supraganglionic infarction (M4-M6 cortex): 3 Total score (0-10 with 10 being normal): 10 IMPRESSION: Stable noncontrast head CT with no acute intracranial pathology. Findings communicated to Dr. Curly Shores via Am[facility] at 10:43 a.m. Electronically Signed   By: Valetta Mole M.D.   On: 08/08/2022 10:47   DG Chest Portable 1 View  Result Date: 08/08/2022 CLINICAL DATA:  Eval infection EXAM: PORTABLE CHEST 1 VIEW COMPARISON:  Radiograph 07/08/2022 FINDINGS: Unchanged cardiomediastinal silhouette. There are small pleural effusions with bibasilar opacities. No pneumothorax. No acute osseous abnormality. IMPRESSION: Small pleural effusions with adjacent bibasilar opacities, could be atelectasis or infection. Electronically Signed   By: Maurine Simmering M.D.   On: 08/08/2022 10:26    Procedures Procedures  Remain on constant cardiac monitoring remained sinus rhythm with normal rates.  Medications Ordered in ED Medications  cefTRIAXone (ROCEPHIN) 1 g in sodium chloride 0.9 % 100 mL IVPB (has no administration in time range)  sodium chloride 0.9 % bolus 500 mL (0 mLs Intravenous Stopped 08/08/22 1427)    ED Course/ Medical Decision Making/ A&P   {                            Medical Decision Making Abdulkareem Goodly is a 87 y.o. male.  With PMH of DM2, HTN, CKD, CVA, dementia BIB EMS from home for increased lethargy and not acting as himself.  Last known normal 6 PM yesterday.  Initially, code stroke was called for concern for confusion and right lower extremity weakness and possible ACA stroke however after obtaining further information from wife, this appears to be baseline for patient so code stroke was canceled.  His glucose on arrival was 108.  CT head obtained, reviewed by me, which showed no ICH or acute findings.  Since is patient's  baseline weakness, less likely stroke more concern for suspected hypoactive delirium and altered mental status in the setting of possible UTI versus acute electrolyte abnormality versus polypharmacy or medication change among multiple other etiologies.  Patient's UA notable for UTI with large leukocyte esterase greater than 50 WBCs ketones and protein with large hemoglobin.  Suspect likely UTI is contributing  to hypoactive delirium.  He is normal white blood cell count 6.8 no hypotension or tachycardia or symptoms or findings concerning for sepsis.  Creatinine slightly elevated 1.25.  Case discussed with hospitalist Dr. Trilby Drummer for admission for continued management of UTI leading to suspected hypoactive delirium.    Amount and/or Complexity of Data Reviewed Labs: ordered. Radiology: ordered.  Risk Decision regarding hospitalization.    Final Clinical Impression(s) / ED Diagnoses Final diagnoses:  Confusion  Urinary tract infection without hematuria, site unspecified    Rx / DC Orders ED Discharge Orders     None         Elgie Congo, MD 08/08/22 1920

## 2022-08-08 NOTE — ED Notes (Signed)
ED TO INPATIENT HANDOFF REPORT  ED Nurse Name and Phone #: Luetta Nutting [phone]  S Name/Age/Gender Mike Mack 87 y.o. male Room/Bed: 035C/035C  Code Status   Code Status: DNR  Home/SNF/Other Home Patient oriented to: self and place Is this baseline? Yes   Triage Complete: Triage complete  Chief Complaint Acute encephalopathy [G93.40]  Triage Note Patient bib [facility] from home with complaints of being lethargic. Patient has a history of dementia lives at home with wife. Per ems wife called out saying he was not his self he was very lethargic. He has a history of UTIs. VSS    Allergies Allergies  Allergen Reactions   Metformin And Related Other (See Comments)    Gi intolerance   [name] [Insulin Glargine-Lixisenatide] Other (See Comments)    Stomach cramps    Level of Care/Admitting Diagnosis ED Disposition     ED Disposition  Admit   Condition  --   [place]: [place] [[id number]]  Level of Care: Telemetry Medical [104]  May place patient in observation at [facility] or [place] if equivalent level of care is available:: No  Covid Evaluation: Asymptomatic - no recent exposure (last 10 days) testing not required  Diagnosis: Acute encephalopathy [id number]  Admitting Physician: Marcelyn Bruins [id number]  Attending Physician: Marcelyn Bruins [id number]          B Medical/Surgery History Past Medical History:  Diagnosis Date   Anemia 05/13/2013   Arthritis    "right leg" (07/25/2013)   BPH (benign prostatic hyperplasia)    Constipation 05/13/2013   Exertional shortness of breath    "sometimes" (07/25/2013)   History of kidney stones    History of stomach ulcers    Hypertension    Neuropathy    Polio    Polio Childhood   Small bowel obstruction ([place])    Stroke ([place]) 2014   residual:  "left hand kind of numb" (07/25/2013)   Type II diabetes mellitus ([place])    Past Surgical History:  Procedure Laterality Date   BIOPSY   11/30/2020   Procedure: BIOPSY;  Surgeon: Ronnette Juniper, MD;  Location: [place];  Service: Gastroenterology;;   BIOPSY  03/03/2022   Procedure: BIOPSY;  Surgeon: Clarene Essex, MD;  Location: [facility];  Service: Gastroenterology;;   BOWEL RESECTION     CATARACT EXTRACTION W/ INTRAOCULAR LENS  IMPLANT, BILATERAL Bilateral 2000's   CHOLECYSTECTOMY N/A 07/25/2013   Procedure: LAPAROSCOPIC CHOLECYSTECTOMY WITH INTRAOPERATIVE CHOLANGIOGRAM;  Surgeon: [name], MD;  Location: [place];  Service: General;  Laterality: N/A;   COLON SURGERY     COLONOSCOPY     Hx: of   CYSTOSCOPY WITH BIOPSY N/A 12/01/2019   Procedure: CYSTOSCOPY WITH BLADDER BIOPSY AND FULGURATION;  Surgeon: Lucas Mallow, MD;  Location: [facility];  Service: Urology;  Laterality: N/A;   ESOPHAGOGASTRODUODENOSCOPY N/A 11/30/2020   Procedure: ESOPHAGOGASTRODUODENOSCOPY (EGD);  Surgeon: Ronnette Juniper, MD;  Location: [place];  Service: Gastroenterology;  Laterality: N/A;   ESOPHAGOGASTRODUODENOSCOPY N/A 03/03/2022   Procedure: ESOPHAGOGASTRODUODENOSCOPY (EGD);  Surgeon: Clarene Essex, MD;  Location: [facility];  Service: Gastroenterology;  Laterality: N/A;   EXCISIONAL HEMORRHOIDECTOMY  2000's   EXTRACORPOREAL SHOCK WAVE LITHOTRIPSY Right 10/03/2018   Procedure: EXTRACORPOREAL SHOCK WAVE LITHOTRIPSY (ESWL);  Surgeon: Festus Aloe, MD;  Location[facility];  Service: Urology;  Laterality: Right;   EYE SURGERY     INGUINAL HERNIA REPAIR Right 1970's   IR RADIOLOGIST EVAL & MGMT  04/06/2020  LAPAROSCOPIC CHOLECYSTECTOMY  07/25/2013   w/LOA (07/25/2013)     A IV Location/Drains/Wounds Patient Lines/Drains/Airways Status     Active Line/Drains/Airways     Name Placement date Placement time Site Days   Peripheral IV 07/28/22 20 G Left Antecubital 07/28/22  1330  Antecubital  11   Pressure Injury 03/09/22 Buttocks Right Stage 3 -  Full thickness tissue loss. Subcutaneous fat may be visible but bone, tendon or muscle are NOT  exposed. 03/09/22  0848  -- 152   Wound / Incision (Open or Dehisced) 03/03/22 Irritant Dermatitis (Moisture Associated Skin Damage) Scrotum Left;Right 03/03/22  1000  Scrotum  158            Intake/Output Last 24 hours No intake or output data in the 24 hours ending 08/08/22 1634  Labs/Imaging Results for orders placed or performed during the hospital encounter of 08/08/22 (from the past 48 hour(s))  Comprehensive metabolic panel     Status: Abnormal   Collection Time: 08/08/22 10:26 AM  Result Value Ref Range   Sodium 138 135 - 145 mmol/L   Potassium 4.1 3.5 - 5.1 mmol/L   Chloride 106 98 - 111 mmol/L   CO2 24 22 - 32 mmol/L   Glucose, Bld 124 (H) 70 - 99 mg/dL    Comment: Glucose reference range applies only to samples taken after fasting for at least 8 hours.   BUN 16 8 - 23 mg/dL   Creatinine, Ser 1.25 (H) 0.61 - 1.24 mg/dL   Calcium 9.4 8.9 - 10.3 mg/dL   Total Protein 7.6 6.5 - 8.1 g/dL   Albumin 3.3 (L) 3.5 - 5.0 g/dL   AST 11 (L) 15 - 41 U/L   ALT 9 0 - 44 U/L   Alkaline Phosphatase 106 38 - 126 U/L   Total Bilirubin 0.6 0.3 - 1.2 mg/dL   GFR, Estimated 57 (L) >60 mL/min    Comment: (NOTE) Calculated using the CKD-EPI Creatinine Equation (2021)    Anion gap 8 5 - 15    Comment: Performed at [facility] [address] [place], [place] [address]  CBC with Differential     Status: Abnormal   Collection Time: 08/08/22 10:26 AM  Result Value Ref Range   WBC 6.8 4.0 - 10.5 K/uL   RBC 4.17 (L) 4.22 - 5.81 MIL/uL   Hemoglobin 12.3 (L) 13.0 - 17.0 g/dL   HCT 38.3 (L) 39.0 - 52.0 %   MCV 91.8 80.0 - 100.0 fL   MCH 29.5 26.0 - 34.0 pg   MCHC 32.1 30.0 - 36.0 g/dL   RDW 17.8 (H) 11.5 - 15.5 %   Platelets 265 150 - 400 K/uL   nRBC 0.0 0.0 - 0.2 %   Neutrophils Relative % 64 %   Neutro Abs 4.4 1.7 - 7.7 K/uL   Lymphocytes Relative 28 %   Lymphs Abs 1.9 0.7 - 4.0 K/uL   Monocytes Relative 6 %   Monocytes Absolute 0.4 0.1 - 1.0 K/uL   Eosinophils Relative 1  %   Eosinophils Absolute 0.0 0.0 - 0.5 K/uL   Basophils Relative 1 %   Basophils Absolute 0.0 0.0 - 0.1 K/uL   Immature Granulocytes 0 %   Abs Immature Granulocytes 0.02 0.00 - 0.07 K/uL    Comment: Performed at [facility] [address], [place], [place] [address]  Troponin I (High Sensitivity)     Status: None   Collection Time: 08/08/22 10:26 AM  Result  Value Ref Range   Troponin I (High Sensitivity) 7 <18 ng/L    Comment: (NOTE) Elevated high sensitivity troponin I (hsTnI) values and significant  changes across serial measurements may suggest ACS but many other  chronic and acute conditions are known to elevate hsTnI results.  Refer to the "Links" section for chest pain algorithms and additional  guidance. Performed at [facility], [place] [address] [place], [place] [address]   Ammonia     Status: None   Collection Time: 08/08/22 10:26 AM  Result Value Ref Range   Ammonia 15 9 - 35 umol/L    Comment: Performed at [facility], [place] [address] [place], [place] [address]  Ethanol     Status: None   Collection Time: 08/08/22 10:26 AM  Result Value Ref Range   Alcohol, Ethyl (B) <10 <10 mg/dL    Comment: (NOTE) Lowest detectable limit for serum alcohol is 10 mg/dL.  For medical purposes only. Performed at [facility], [place] [address], [place], [place] [address]   CBG monitoring, ED     Status: Abnormal   Collection Time: 08/08/22 10:28 AM  Result Value Ref Range   Glucose-Capillary 108 (H) 70 - 99 mg/dL    Comment: Glucose reference range applies only to samples taken after fasting for at least 8 hours.  I-Stat venous blood gas, ED     Status: Abnormal   Collection Time: 08/08/22 10:38 AM  Result Value Ref Range   pH, Ven 7.366 7.25 - 7.43   pCO2, Ven 39.4 (L) 44 - 60 mmHg   pO2, Ven 45 32 - 45 mmHg   Bicarbonate 22.5 20.0 - 28.0 mmol/L   TCO2 24 22 - 32 mmol/L   O2 Saturation 79 %   Acid-base deficit 3.0 (H) 0.0 - 2.0 mmol/L   Sodium 142  135 - 145 mmol/L   Potassium 4.2 3.5 - 5.1 mmol/L   Calcium, Ion 1.22 1.15 - 1.40 mmol/L   HCT 39.0 39.0 - 52.0 %   Hemoglobin 13.3 13.0 - 17.0 g/dL   Sample type VENOUS   Troponin I (High Sensitivity)     Status: None   Collection Time: 08/08/22 12:05 PM  Result Value Ref Range   Troponin I (High Sensitivity) 6 <18 ng/L    Comment: (NOTE) Elevated high sensitivity troponin I (hsTnI) values and significant  changes across serial measurements may suggest ACS but many other  chronic and acute conditions are known to elevate hsTnI results.  Refer to the "Links" section for chest pain algorithms and additional  guidance. Performed at [facility], [place] [address], [place],  [address]   Urinalysis, Routine w reflex microscopic -Urine, Clean Catch     Status: Abnormal   Collection Time: 08/08/22  1:52 PM  Result Value Ref Range   Color, Urine YELLOW YELLOW   APPearance HAZY (A) CLEAR   Specific Gravity, Urine 1.016 1.005 - 1.030   pH 5.0 5.0 - 8.0   Glucose, UA NEGATIVE NEGATIVE mg/dL   Hgb urine dipstick LARGE (A) NEGATIVE   Bilirubin Urine NEGATIVE NEGATIVE   Ketones, ur 5 (A) NEGATIVE mg/dL   Protein, ur 30 (A) NEGATIVE mg/dL   Nitrite NEGATIVE NEGATIVE   Leukocytes,Ua LARGE (A) NEGATIVE   RBC / HPF >50 0 - 5 RBC/hpf   WBC, UA >50 0 - 5 WBC/hpf   Bacteria, UA NONE SEEN NONE SEEN   Squamous Epithelial / HPF 0-5 0 - 5 /  HPF   Mucus PRESENT    Budding Yeast PRESENT     Comment: Performed at [facility], [place] [address], [place], [place] [address]  CBG monitoring, ED     Status: None   Collection Time: 08/08/22  3:40 PM  Result Value Ref Range   Glucose-Capillary 86 70 - 99 mg/dL    Comment: Glucose reference range applies only to samples taken after fasting for at least 8 hours.   CT HEAD CODE STROKE WO CONTRAST  Result Date: 08/08/2022 CLINICAL DATA:  Code stroke. EXAM: CT HEAD WITHOUT CONTRAST TECHNIQUE: Contiguous axial images were obtained from the base  of the skull through the vertex without intravenous contrast. RADIATION DOSE REDUCTION: This exam was performed according to the departmental dose-optimization program which includes automated exposure control, adjustment of the mA and/or kV according to patient size and/or use of iterative reconstruction technique. COMPARISON:  CT head 07/28/2022 FINDINGS: Brain: There is no evidence of acute intracranial hemorrhage, extra-axial fluid collection, or acute territorial infarct. There is unchanged prominence of the ventricular system out of proportion to the degree of sulcal widening. Multiple remote infarcts in the bilateral cerebellar hemispheres and background chronic small-vessel ischemic change are stable. The pituitary and suprasellar region are normal. There is no mass lesion. There is no mass effect or midline shift. Vascular: No hyperdense vessel or unexpected calcification. Skull: Normal. Negative for fracture or focal lesion. Sinuses/Orbits: The imaged paranasal sinuses are clear. Bilateral lens implants are in place. The globes and orbits are otherwise unremarkable. Other: A right mastoid effusion is unchanged. ASPECTS (Gray Stroke Program Early CT Score) - Ganglionic level infarction (caudate, lentiform nuclei, internal capsule, insula, M1-M3 cortex): 7 - Supraganglionic infarction (M4-M6 cortex): 3 Total score (0-10 with 10 being normal): 10 IMPRESSION: Stable noncontrast head CT with no acute intracranial pathology. Findings communicated to Dr. Curly Shores via Amion at 10:43 a.m. Electronically Signed   By: Valetta Mole M.D.   On: 08/08/2022 10:47   DG Chest Portable 1 View  Result Date: 08/08/2022 CLINICAL DATA:  Eval infection EXAM: PORTABLE CHEST 1 VIEW COMPARISON:  Radiograph 07/08/2022 FINDINGS: Unchanged cardiomediastinal silhouette. There are small pleural effusions with bibasilar opacities. No pneumothorax. No acute osseous abnormality. IMPRESSION: Small pleural effusions with adjacent  bibasilar opacities, could be atelectasis or infection. Electronically Signed   By: Maurine Simmering M.D.   On: 08/08/2022 10:26    Pending Labs Unresulted Labs (From admission, onward)     Start     Ordered   08/15/22 0500  Creatinine, serum  (enoxaparin (LOVENOX)    CrCl >/= 30 ml/min)  Weekly,   R     Comments: while on enoxaparin therapy    08/08/22 1600   08/09/22 0500  Comprehensive metabolic panel  Tomorrow morning,   R        08/08/22 1600   08/09/22 0500  CBC  Tomorrow morning,   R        08/08/22 1600   08/08/22 1600  Resp panel by RT-PCR (RSV, Flu A&B, Covid) Anterior Nasal Swab  (Tier 2 - SymptomaticResp panel by RT-PCR (RSV, Flu A&B, Covid))  Once,   R        08/08/22 1600   08/08/22 1557  Magnesium  Add-on,   AD        08/08/22 1600   08/08/22 1557  TSH  Add-on,   AD        08/08/22 1600   08/08/22 1556  Urine Culture (for  pregnant, neutropenic or urologic patients or patients with an indwelling urinary catheter)  (Urine Culture)  Add-on,   AD       Question:  Indication  Answer:  Altered mental status (if no other cause identified)   08/08/22 1600            Vitals/Pain Today's Vitals   08/08/22 1100 08/08/22 1405 08/08/22 1537 08/08/22 1600  BP:  117/85  120/69  Pulse: 69 70  75  Resp: 15 16  16  $ Temp:   (!) 97.5 F (36.4 C)   TempSrc:   Axillary   SpO2: 99% 100%  100%    Isolation Precautions Airborne and Contact precautions  Medications Medications  aspirin EC tablet 81 mg (has no administration in time range)  memantine (NAMENDA) tablet 10 mg (has no administration in time range)  mirtazapine (REMERON SOL-TAB) disintegrating tablet 15 mg (has no administration in time range)  atorvastatin (LIPITOR) tablet 20 mg (has no administration in time range)  irbesartan (AVAPRO) tablet 150 mg (has no administration in time range)  levothyroxine (SYNTHROID) tablet 88 mcg (has no administration in time range)  pantoprazole (PROTONIX) EC tablet 40 mg (has no  administration in time range)  tamsulosin (FLOMAX) capsule 0.4 mg (has no administration in time range)  feeding supplement (GLUCERNA SHAKE) (GLUCERNA SHAKE) liquid 237 mL (has no administration in time range)  enoxaparin (LOVENOX) injection 40 mg (has no administration in time range)  sodium chloride flush (NS) 0.9 % injection 3 mL (has no administration in time range)  acetaminophen (TYLENOL) tablet 650 mg (has no administration in time range)    Or  acetaminophen (TYLENOL) suppository 650 mg (has no administration in time range)  polyethylene glycol (MIRALAX / GLYCOLAX) packet 17 g (has no administration in time range)  sodium chloride 0.9 % bolus 500 mL (0 mLs Intravenous Stopped 08/08/22 1427)  cefTRIAXone (ROCEPHIN) 1 g in sodium chloride 0.9 % 100 mL IVPB (0 g Intravenous Stopped 08/08/22 1609)    Mobility non-ambulatory     Focused Assessments Renal Assessment Handoff:     R Recommendations: See Admitting Provider Note  Report given to:   Additional Notes:

## 2022-08-08 NOTE — ED Triage Notes (Signed)
Patient bib [facility] from home with complaints of being lethargic. Patient has a history of dementia lives at home with wife. Per ems wife called out saying he was not his self he was very lethargic. He has a history of UTIs. VSS

## 2022-08-08 NOTE — Progress Notes (Signed)
Patient received from ED

## 2022-08-08 NOTE — ED Notes (Signed)
Got patient undressed into a gown opn the monitor did EKG shown to dr Nechama Guard patient is resting with call ebll in reach got patient some warm blankets

## 2022-08-08 NOTE — H&P (Signed)
History and Physical   Mike Mack [id number] DOB: 12-02-1935 DOA: 08/08/2022  PCP: [name], MD   Patient coming from: Home  Chief Complaint: Confusion, lethargy  HPI: Mike Mack is a 87 y.o. male with medical history significant of diabetes, GI bleed, hypertension, CKD 3A, BPH, anemia, stroke, polio, dementia presenting with altered mental status and lethargy.  History obtained with assistance of chart review and family due to patient's baseline dementia and altered mentation.  Patient has dementia at baseline as above and chronic lower extremity weakness right greater than left and is largely bedbound.  Wife reports more difficult to arouse him this morning and he has been more confused throughout the day than usual.  Only able to answer yes/no questions and did not eat very much breakfast.  He has had similar issues with UTI in the past and wife was concerned for this.  He was notably in the ED 10 days ago for syncope.  Unable to obtain full review of systems due to patient's dementia and altered mental status, but has no specific complaints.  ED Course: Vital signs in the ED significant for blood pressure in the 0000000 to Q000111Q systolic.  Lab workup included CMP with creatinine stable 1.25, glucose 124, albumin 3.3.  CBC with hemoglobin stable at 12.3.  Troponin negative x 2.  Ammonia level normal, ethanol level negative.  Urinalysis with hemoglobin, large leukocytes but notably no bacteria noted.  Chest x-ray showed small pleural effusion with basilar opacities likely atelectasis but could be infection.  CT head showed no acute abnormality.  Patient received ceftriaxone and 500 cc IV fluids in the ED.  VBG also showed normal pH and mildly low pCO2 of 39.4.  Review of Systems: As per HPI otherwise all other systems reviewed and are negative.  Past Medical History:  Diagnosis Date   Anemia 05/13/2013   Arthritis    "right leg" (07/25/2013)   BPH (benign prostatic  hyperplasia)    Constipation 05/13/2013   Exertional shortness of breath    "sometimes" (07/25/2013)   History of kidney stones    History of stomach ulcers    Hypertension    Neuropathy    Polio    Polio Childhood   Small bowel obstruction ([place])    Stroke ([place]) 2014   residual:  "left hand kind of numb" (07/25/2013)   Type II diabetes mellitus ([place])     Past Surgical History:  Procedure Laterality Date   BIOPSY  11/30/2020   Procedure: BIOPSY;  Surgeon: Ronnette Juniper, MD;  Location: [facility];  Service: Gastroenterology;;   BIOPSY  03/03/2022   Procedure: BIOPSY;  Surgeon: Clarene Essex, MD;  Location: [facility];  Service: Gastroenterology;;   BOWEL RESECTION     CATARACT EXTRACTION W/ INTRAOCULAR LENS  IMPLANT, BILATERAL Bilateral 2000's   CHOLECYSTECTOMY N/A 07/25/2013   Procedure: LAPAROSCOPIC CHOLECYSTECTOMY WITH INTRAOPERATIVE CHOLANGIOGRAM;  Surgeon: [name], MD;  Location: [place];  Service: General;  Laterality: N/A;   COLON SURGERY     COLONOSCOPY     Hx: of   CYSTOSCOPY WITH BIOPSY N/A 12/01/2019   Procedure: CYSTOSCOPY WITH BLADDER BIOPSY AND FULGURATION;  Surgeon: Lucas Mallow, MD;  Location: [facility];  Service: Urology;  Laterality: N/A;   ESOPHAGOGASTRODUODENOSCOPY N/A 11/30/2020   Procedure: ESOPHAGOGASTRODUODENOSCOPY (EGD);  Surgeon: Ronnette Juniper, MD;  Location: [facility];  Service: Gastroenterology;  Laterality: N/A;   ESOPHAGOGASTRODUODENOSCOPY N/A 03/03/2022   Procedure: ESOPHAGOGASTRODUODENOSCOPY (EGD);  Surgeon: Clarene Essex, MD;  Location: [facility];  Service: Gastroenterology;  Laterality: N/A;   EXCISIONAL HEMORRHOIDECTOMY  2000's   EXTRACORPOREAL SHOCK WAVE LITHOTRIPSY Right 10/03/2018   Procedure: EXTRACORPOREAL SHOCK WAVE LITHOTRIPSY (ESWL);  Surgeon: Festus Aloe, MD;  Location: [facility];  Service: Urology;  Laterality: Right;   EYE SURGERY     INGUINAL HERNIA REPAIR Right 1970's   IR RADIOLOGIST EVAL & MGMT  04/06/2020   LAPAROSCOPIC  CHOLECYSTECTOMY  07/25/2013   w/LOA (07/25/2013)    Social History  reports that he has never smoked. He has never used smokeless tobacco. He reports that he does not drink alcohol and does not use drugs.  Allergies  Allergen Reactions   Metformin And Related Other (See Comments)    Gi intolerance   [name] [Insulin Glargine-Lixisenatide] Other (See Comments)    Stomach cramps    Family History  Problem Relation Age of Onset   Heart failure Mother    Hypertension Sister    Diabetes Sister    Diabetes Brother    Hypertension Brother   Reviewed on admission  Prior to Admission medications   Medication Sig Start Date End Date Taking? Authorizing Provider  acetaminophen (TYLENOL) 325 MG tablet Take 2 tablets (650 mg total) by mouth every 6 (six) hours as needed for mild pain (or Fever >/= 101). 11/27/20   Debbe Odea, MD  aspirin EC 81 MG tablet Take 1 tablet (81 mg total) by mouth daily. Swallow whole. 04/06/22   Little Ishikawa, MD  atorvastatin (LIPITOR) 20 MG tablet Take 20 mg by mouth every evening.  11/03/13   [provider]  B Complex Vitamins (B COMPLEX PO) Take 1 tablet by mouth at bedtime.    [provider]  brimonidine (ALPHAGAN) 0.2 % ophthalmic solution Place 1 drop into both eyes 3 (three) times daily.    [provider]  cholecalciferol (VITAMIN D3) 10 MCG (400 UNIT) TABS tablet Take 400 Units by mouth daily.    [provider]  docusate sodium (COLACE) 100 MG capsule Take 1 capsule (100 mg total) by mouth 2 (two) times daily as needed for mild constipation. 03/09/22   Geradine Girt, DO  dorzolamide-timolol (COSOPT) 22.3-6.8 MG/ML ophthalmic solution Place 1 drop into both eyes 2 (two) times daily. 03/04/20   [provider]  feeding supplement, GLUCERNA SHAKE, (GLUCERNA SHAKE) LIQD Take 237 mLs by mouth 3 (three) times daily between meals. Patient taking differently: Take 237 mLs by mouth 2 (two) times daily between meals.  03/25/20   Antonieta Pert, MD  Finerenone (KERENDIA) 20 MG TABS Take 10 mg by mouth daily.    [provider]  latanoprost (XALATAN) 0.005 % ophthalmic solution Place 1 drop into both eyes at bedtime.  11/17/14   [provider]  levothyroxine (SYNTHROID) 88 MCG tablet Take 88 mcg by mouth daily before breakfast.    [provider]  memantine (NAMENDA) 10 MG tablet Take 10 mg by mouth in the morning and at bedtime. 11/04/20   [provider]  mirtazapine (REMERON SOL-TAB) 15 MG disintegrating tablet Take 1 tablet (15 mg total) by mouth at bedtime. 03/09/22   Geradine Girt, DO  olmesartan (BENICAR) 20 MG tablet Take 20 mg by mouth daily.    [provider]  pantoprazole (PROTONIX) 40 MG tablet Take 1 tablet (40 mg total) by mouth daily. 07/08/22   Dion Saucier A, PA  polyethylene glycol (MIRALAX / GLYCOLAX) packet Take 17 g by mouth daily. Patient taking differently: Take 17 g by  mouth daily as needed for mild constipation. 09/06/13   [name], MD  RHOPRESSA 0.02 % SOLN Place 1 drop into both eyes at bedtime. 03/05/20   [provider]  Tamsulosin HCl (FLOMAX) 0.4 MG CAPS Take 0.4 mg by mouth in the morning and at bedtime.    [provider]    Physical Exam: Vitals:   08/08/22 1007 08/08/22 1100 08/08/22 1405 08/08/22 1537  BP: (!) 151/74  117/85   Pulse: 71 69 70   Resp: 12 15 16   $ Temp: 98.3 F (36.8 C)   (!) 97.5 F (36.4 C)  TempSrc: Oral   Axillary  SpO2: 100% 99% 100%     Physical Exam Constitutional:      General: He is not in acute distress.    Appearance: Normal appearance.     Comments: Pleasantly demented, tired appearing  HENT:     Head: Normocephalic and atraumatic.     Mouth/Throat:     Mouth: Mucous membranes are moist.     Pharynx: Oropharynx is clear.  Eyes:     Extraocular Movements: Extraocular movements intact.     Pupils: Pupils are equal, round, and reactive to light.  Cardiovascular:      Rate and Rhythm: Normal rate and regular rhythm.     Pulses: Normal pulses.     Heart sounds: Normal heart sounds.  Pulmonary:     Effort: Pulmonary effort is normal. No respiratory distress.     Breath sounds: Normal breath sounds.  Abdominal:     General: Bowel sounds are normal. There is no distension.     Palpations: Abdomen is soft.     Tenderness: There is no abdominal tenderness.  Musculoskeletal:        General: No swelling or deformity.  Skin:    General: Skin is warm and dry.  Neurological:     General: No focal deficit present.     Comments: Pleasantly demented, tired appearing    Labs on Admission: I have personally reviewed following labs and imaging studies  CBC: Recent Labs  Lab 08/08/22 1026 08/08/22 1038  WBC 6.8  --   NEUTROABS 4.4  --   HGB 12.3* 13.3  HCT 38.3* 39.0  MCV 91.8  --   PLT 265  --     Basic Metabolic Panel: Recent Labs  Lab 08/08/22 1026 08/08/22 1038  NA 138 142  K 4.1 4.2  CL 106  --   CO2 24  --   GLUCOSE 124*  --   BUN 16  --   CREATININE 1.25*  --   CALCIUM 9.4  --     GFR: Estimated Creatinine Clearance: 43.1 mL/min (A) (by C-G formula based on SCr of 1.25 mg/dL (H)).  Liver Function Tests: Recent Labs  Lab 08/08/22 1026  AST 11*  ALT 9  ALKPHOS 106  BILITOT 0.6  PROT 7.6  ALBUMIN 3.3*    Urine analysis:    Component Value Date/Time   COLORURINE YELLOW 08/08/2022 1352   APPEARANCEUR HAZY (A) 08/08/2022 1352   LABSPEC 1.016 08/08/2022 1352   PHURINE 5.0 08/08/2022 1352   GLUCOSEU NEGATIVE 08/08/2022 1352   HGBUR LARGE (A) 08/08/2022 1352   BILIRUBINUR NEGATIVE 08/08/2022 1352   KETONESUR 5 (A) 08/08/2022 1352   PROTEINUR 30 (A) 08/08/2022 1352   UROBILINOGEN 0.2 10/25/2014 1520   NITRITE NEGATIVE 08/08/2022 1352   LEUKOCYTESUR LARGE (A) 08/08/2022 1352    Radiological Exams on Admission: CT HEAD CODE STROKE  WO CONTRAST  Result Date: 08/08/2022 CLINICAL DATA:  Code stroke. EXAM: CT HEAD WITHOUT  CONTRAST TECHNIQUE: Contiguous axial images were obtained from the base of the skull through the vertex without intravenous contrast. RADIATION DOSE REDUCTION: This exam was performed according to the departmental dose-optimization program which includes automated exposure control, adjustment of the mA and/or kV according to patient size and/or use of iterative reconstruction technique. COMPARISON:  CT head 07/28/2022 FINDINGS: Brain: There is no evidence of acute intracranial hemorrhage, extra-axial fluid collection, or acute territorial infarct. There is unchanged prominence of the ventricular system out of proportion to the degree of sulcal widening. Multiple remote infarcts in the bilateral cerebellar hemispheres and background chronic small-vessel ischemic change are stable. The pituitary and suprasellar region are normal. There is no mass lesion. There is no mass effect or midline shift. Vascular: No hyperdense vessel or unexpected calcification. Skull: Normal. Negative for fracture or focal lesion. Sinuses/Orbits: The imaged paranasal sinuses are clear. Bilateral lens implants are in place. The globes and orbits are otherwise unremarkable. Other: A right mastoid effusion is unchanged. ASPECTS ([place] Stroke Program Early CT Score) - Ganglionic level infarction (caudate, lentiform nuclei, internal capsule, insula, M1-M3 cortex): 7 - Supraganglionic infarction (M4-M6 cortex): 3 Total score (0-10 with 10 being normal): 10 IMPRESSION: Stable noncontrast head CT with no acute intracranial pathology. Findings communicated to Dr. Curly Shores via Amion at 10:43 a.m. Electronically Signed   By: Valetta Mole M.D.   On: 08/08/2022 10:47   DG Chest Portable 1 View  Result Date: 08/08/2022 CLINICAL DATA:  Eval infection EXAM: PORTABLE CHEST 1 VIEW COMPARISON:  Radiograph 07/08/2022 FINDINGS: Unchanged cardiomediastinal silhouette. There are small pleural effusions with bibasilar opacities. No pneumothorax. No acute  osseous abnormality. IMPRESSION: Small pleural effusions with adjacent bibasilar opacities, could be atelectasis or infection. Electronically Signed   By: Maurine Simmering M.D.   On: 08/08/2022 10:26    EKG: Independently reviewed.  Sinus rhythm at 74 bpm.  Mild baseline artifact.  Nonspecific T wave flattening.  Assessment/Plan Principal Problem:   Acute encephalopathy Active Problems:   recent GI bleed in 02/2022   CKD (chronic kidney disease) stage 3, GFR 30-59 ml/min (HCC)   Hypertension   BPH (benign prostatic hyperplasia)   Anemia   H/O: CVA (cerebrovascular accident)   H/O poliomyelitis   Type 2 diabetes mellitus ([place])   Dementia ([place])   Acute encephalopathy Dementia > Patient presenting with encephalopathy in the setting of chronic dementia.  Has been more difficult to arouse than usual and more confused than baseline per family.  Only answering yes/no questions and eating less.  Has happened with previous UTIs. > Cover with ceftriaxone in the ED for possible UTI though urine showed hemoglobin and leukocytes only, no bacteria.  Will add on urine culture.  Electrolytes stable and no leukocytosis.  Chest x-ray with small effusions and atelectasis versus infection. > Unclear etiology at this time.  Likely degree of delirium in his setting of his chronic dementia.  Unclear trigger, has had before with UTI but there was no bacteria on current urinalysis, checking urine culture.  Will also check for flu COVID and RSV and consider full RVP if this is negative. - Monitor on telemetry - Hold off on further antibiotics - Follow-up urine culture - Trend fever curve and WBC - Check flu COVID and RSV screen - Supportive care  History of stroke - Continue home atorvastatin and aspirin  Diabetes - SSI  Hypertension - Replace home olmesartan with  formulary irbesartan  CKD 3a > Creatinine remained stable at 1.25 in ED. - Trend renal function and electrolytes  BPH - Continue home  tamsulosin  Glaucoma - Continue home Eyedrops  DVT prophylaxis: Lovenox Code Status:   DNR, Confirmed with spouse. Family Communication:  Spouse updated by phone.  Disposition Plan:   Patient is from:  Home  Anticipated DC to:  Home  Anticipated DC date:  1 to 2 days  Anticipated DC barriers: None  Consults called:  None Admission status:  Observation, telemetry  Severity of Illness: The appropriate patient status for this patient is OBSERVATION. Observation status is judged to be reasonable and necessary in order to provide the required intensity of service to ensure the patient's safety. The patient's presenting symptoms, physical exam findings, and initial radiographic and laboratory data in the context of their medical condition is felt to place them at decreased risk for further clinical deterioration. Furthermore, it is anticipated that the patient will be medically stable for discharge from the hospital within 2 midnights of admission.    Marcelyn Bruins MD [facility]  How to contact the [facility] Attending or Consulting provider [place] or covering provider during after hours [place], for this patient?   Check the care team in [facility] and look for a) attending/consulting TR[facility] provider listed and b) the [facility] team listed Log into [email or web address] and use [place] universal password to access. If you do not have the password, please contact the hospital operator. Locate the [facility] provider you are looking for under [facility] and page to a number that you can be directly reached. If you still have difficulty reaching the provider, please page the [facility] (Director on Call) for the Hospitalists listed on amion for assistance.  08/08/2022, 4:00 PM

## 2022-08-09 DIAGNOSIS — G8314 Monoplegia of lower limb affecting left nondominant side: Secondary | ICD-10-CM | POA: Diagnosis present

## 2022-08-09 DIAGNOSIS — I959 Hypotension, unspecified: Secondary | ICD-10-CM | POA: Diagnosis present

## 2022-08-09 DIAGNOSIS — N400 Enlarged prostate without lower urinary tract symptoms: Secondary | ICD-10-CM | POA: Diagnosis present

## 2022-08-09 DIAGNOSIS — Z87440 Personal history of urinary (tract) infections: Secondary | ICD-10-CM | POA: Diagnosis not present

## 2022-08-09 DIAGNOSIS — Z79899 Other long term (current) drug therapy: Secondary | ICD-10-CM | POA: Diagnosis not present

## 2022-08-09 DIAGNOSIS — N1831 Chronic kidney disease, stage 3a: Secondary | ICD-10-CM | POA: Diagnosis present

## 2022-08-09 DIAGNOSIS — Z9049 Acquired absence of other specified parts of digestive tract: Secondary | ICD-10-CM | POA: Diagnosis not present

## 2022-08-09 DIAGNOSIS — H409 Unspecified glaucoma: Secondary | ICD-10-CM | POA: Diagnosis present

## 2022-08-09 DIAGNOSIS — Z888 Allergy status to other drugs, medicaments and biological substances status: Secondary | ICD-10-CM | POA: Diagnosis not present

## 2022-08-09 DIAGNOSIS — E1122 Type 2 diabetes mellitus with diabetic chronic kidney disease: Secondary | ICD-10-CM | POA: Diagnosis present

## 2022-08-09 DIAGNOSIS — Z7982 Long term (current) use of aspirin: Secondary | ICD-10-CM | POA: Diagnosis not present

## 2022-08-09 DIAGNOSIS — R5383 Other fatigue: Secondary | ICD-10-CM | POA: Diagnosis present

## 2022-08-09 DIAGNOSIS — Z7401 Bed confinement status: Secondary | ICD-10-CM | POA: Diagnosis not present

## 2022-08-09 DIAGNOSIS — G9341 Metabolic encephalopathy: Secondary | ICD-10-CM

## 2022-08-09 DIAGNOSIS — I129 Hypertensive chronic kidney disease with stage 1 through stage 4 chronic kidney disease, or unspecified chronic kidney disease: Secondary | ICD-10-CM | POA: Diagnosis present

## 2022-08-09 DIAGNOSIS — N390 Urinary tract infection, site not specified: Secondary | ICD-10-CM | POA: Diagnosis present

## 2022-08-09 DIAGNOSIS — F0390 Unspecified dementia without behavioral disturbance: Secondary | ICD-10-CM | POA: Diagnosis present

## 2022-08-09 DIAGNOSIS — D631 Anemia in chronic kidney disease: Secondary | ICD-10-CM | POA: Diagnosis present

## 2022-08-09 DIAGNOSIS — Z79890 Hormone replacement therapy (postmenopausal): Secondary | ICD-10-CM | POA: Diagnosis not present

## 2022-08-09 DIAGNOSIS — R410 Disorientation, unspecified: Secondary | ICD-10-CM | POA: Diagnosis present

## 2022-08-09 DIAGNOSIS — Z8612 Personal history of poliomyelitis: Secondary | ICD-10-CM | POA: Diagnosis not present

## 2022-08-09 DIAGNOSIS — G9340 Encephalopathy, unspecified: Secondary | ICD-10-CM | POA: Diagnosis not present

## 2022-08-09 DIAGNOSIS — Z66 Do not resuscitate: Secondary | ICD-10-CM | POA: Diagnosis present

## 2022-08-09 DIAGNOSIS — M1990 Unspecified osteoarthritis, unspecified site: Secondary | ICD-10-CM | POA: Diagnosis present

## 2022-08-09 DIAGNOSIS — Z1152 Encounter for screening for COVID-19: Secondary | ICD-10-CM | POA: Diagnosis not present

## 2022-08-09 DIAGNOSIS — Z8673 Personal history of transient ischemic attack (TIA), and cerebral infarction without residual deficits: Secondary | ICD-10-CM | POA: Diagnosis not present

## 2022-08-09 HISTORY — DX: Metabolic encephalopathy: G93.41

## 2022-08-09 LAB — COMPREHENSIVE METABOLIC PANEL
ALT: 9 U/L (ref 0–44)
AST: 11 U/L — ABNORMAL LOW (ref 15–41)
Albumin: 3.1 g/dL — ABNORMAL LOW (ref 3.5–5.0)
Alkaline Phosphatase: 98 U/L (ref 38–126)
Anion gap: 10 (ref 5–15)
BUN: 15 mg/dL (ref 8–23)
CO2: 21 mmol/L — ABNORMAL LOW (ref 22–32)
Calcium: 9 mg/dL (ref 8.9–10.3)
Chloride: 109 mmol/L (ref 98–111)
Creatinine, Ser: 1.19 mg/dL (ref 0.61–1.24)
GFR, Estimated: >60 mL/min (ref >60)
Glucose, Bld: 75 mg/dL (ref 70–99)
Potassium: 4.5 mmol/L (ref 3.5–5.1)
Sodium: 140 mmol/L (ref 135–145)
Total Bilirubin: 0.9 mg/dL (ref 0.3–1.2)
Total Protein: 6.9 g/dL (ref 6.5–8.1)

## 2022-08-09 LAB — GLUCOSE, CAPILLARY
Glucose-Capillary: 95 mg/dL (ref 70–99)
Glucose-Capillary: 75 mg/dL (ref 70–99)
Glucose-Capillary: 166 mg/dL — ABNORMAL HIGH (ref 70–99)
Glucose-Capillary: 97 mg/dL (ref 70–99)
Glucose-Capillary: 116 mg/dL — ABNORMAL HIGH (ref 70–99)
Glucose-Capillary: 94 mg/dL (ref 70–99)

## 2022-08-09 LAB — CBC
HCT: 35 % — ABNORMAL LOW (ref 39.0–52.0)
Hemoglobin: 11.1 g/dL — ABNORMAL LOW (ref 13.0–17.0)
MCH: 29 pg (ref 26.0–34.0)
MCHC: 31.7 g/dL (ref 30.0–36.0)
MCV: 91.4 fL (ref 80.0–100.0)
Platelets: 261 10*3/uL (ref 150–400)
RBC: 3.83 MIL/uL — ABNORMAL LOW (ref 4.22–5.81)
RDW: 17.6 % — ABNORMAL HIGH (ref 11.5–15.5)
WBC: 7 10*3/uL (ref 4.0–10.5)
nRBC: 0 % (ref 0.0–0.2)

## 2022-08-09 MED ORDER — SODIUM CHLORIDE 0.9 % IV BOLUS
1000.0000 mL | Freq: Once | INTRAVENOUS | Status: AC
  Administered 2022-08-09: 1000 mL via INTRAVENOUS

## 2022-08-09 MED ORDER — SODIUM CHLORIDE 0.9 % IV BOLUS
500.0000 mL | Freq: Once | INTRAVENOUS | Status: AC
  Administered 2022-08-09: 500 mL via INTRAVENOUS

## 2022-08-09 MED ORDER — SODIUM CHLORIDE 0.9 % IV SOLN
1.0000 g | INTRAVENOUS | Status: AC
  Administered 2022-08-09 – 2022-08-11 (×3): 1 g via INTRAVENOUS
  Filled 2022-08-09 (×3): qty 10

## 2022-08-09 MED ORDER — SODIUM CHLORIDE 0.9 % IV SOLN: Freq: Once | INTRAVENOUS | Status: AC

## 2022-08-09 NOTE — TOC Initial Note (Signed)
Transition of Care [facility]) - Initial/Assessment Note    Patient Details  Name: Mike Mack MRN: [id number] Date of Birth: [birth year]  Transition of Care [facility]) CM/SW Contact:    Pollie Friar, RN Phone Number: 08/09/2022, 1:50 PM  Clinical Narrative:                 CM met with the patient and his spouse at the bedside. Pt slept through visit. Wife states she and patients grandson provide needed care at home.  Wife manages his medications and denies any issues. They have all needed DME.  Pt's son also assists at home and provides needed transportation. Wife states the patient has been approved for [facility] but they have not used their services yet.  Family will transport home when medically ready.  TOC following.  Expected Discharge Plan: Home/Self Care Barriers to Discharge: Continued Medical Work up   Patient Goals and CMS Choice   CMS Medicare.gov Compare Post Acute Care list provided to:: Patient Represenative (must comment) Choice offered to / list presented to : Spouse      Expected Discharge Plan and Services   Discharge Planning Services: CM Consult   Living arrangements for the past 2 months: Single Family Home                                      Prior Living Arrangements/Services Living arrangements for the past 2 months: Single Family Home Lives with:: Spouse (grandson)          Need for Family Participation in Patient Care: Yes (Comment) Care giver support system in place?: Yes (comment) Current home services: DME (3 in 1/ shower seat/ walker/ hospital bed/ wheelchair) Criminal Activity/Legal Involvement Pertinent to Current Situation/Hospitalization: No - Comment as needed  Activities of Daily Living Home Assistive Devices/Equipment: Shower chair with back, Hand-held shower hose, Grab bars in shower, Walker (specify type), Wheelchair, Blood pressure cuff, Eyeglasses (4 wheel walker) ADL Screening (condition at time of admission) Patient's  cognitive ability adequate to safely complete daily activities?: No Is the patient deaf or have difficulty hearing?: No Does the patient have difficulty seeing, even when wearing glasses/contacts?: No Does the patient have difficulty concentrating, remembering, or making decisions?: Yes Patient able to express need for assistance with ADLs?: No Does the patient have difficulty dressing or bathing?: Yes Independently performs ADLs?: No Communication: Independent Dressing (OT): Needs assistance Is this a change from baseline?: Pre-admission baseline Grooming: Needs assistance Is this a change from baseline?: Pre-admission baseline Feeding: Needs assistance Is this a change from baseline?: Pre-admission baseline Bathing: Needs assistance Is this a change from baseline?: Pre-admission baseline Toileting: Needs assistance Is this a change from baseline?: Pre-admission baseline In/Out Bed: Needs assistance Is this a change from baseline?: Pre-admission baseline Walks in Home: Dependent Is this a change from baseline?: Pre-admission baseline Does the patient have difficulty walking or climbing stairs?: Yes Weakness of Legs: Both Weakness of Arms/Hands: Both  Permission Sought/Granted                  Emotional Assessment Appearance:: Appears stated age         Psych Involvement: No (comment)  Admission diagnosis:  Confusion [R41.0] Acute encephalopathy [G93.40] Urinary tract infection without hematuria, site unspecified [id number] Acute metabolic encephalopathy [id number] Patient Active Problem List   Diagnosis Date Noted   Acute metabolic encephalopathy Q000111Q   Acute encephalopathy 08/08/2022  Glaucoma 08/08/2022   Hypothermia 04/01/2022   recent GI bleed in 02/2022 04/01/2022   Right tibial fracture 04/01/2022   Pressure injury of skin 03/09/2022   Pleural effusion, bilateral 10/21/2021   Malnutrition of moderate degree 02/23/2021   UTI (urinary tract infection)  02/22/2021   CKD (chronic kidney disease) stage 3, GFR 30-59 ml/min (HCC) 02/21/2021   Dementia ([place]) 11/27/2020   Polio osteopathy of lower leg, left ([place]) 11/27/2020   Subphrenic abscess ([place]) 03/19/2020   Failure to thrive (child) 03/19/2020   Posterior vitreous detachment of both eyes 12/11/2019   Syncope 10/25/2014   H/O: CVA (cerebrovascular accident) 10/25/2014   H/O poliomyelitis 10/25/2014   Type 2 diabetes mellitus ([place]) 10/25/2014   Tachycardia 08/29/2013   Cough 08/29/2013   Monoparesis of leg ([place]) 08/20/2013   Muscle weakness of lower extremity 08/20/2013   Right upper quadrant abdominal abscess ([place]) 08/15/2013   Hepatic abscess 08/15/2013   Subacute delirium 08/14/2013   Hyperglycemia 08/14/2013   Oral thrush 08/14/2013   Leukocytosis, unspecified 08/14/2013   Debility 08/14/2013   S/P cholecystectomy 07/29/2013   Cholecystitis with cholelithiasis 07/25/2013   Arthritis 06/06/2013   Headache(784.0) 06/06/2013   Headache 06/06/2013   Generalized weakness 05/24/2013   Cholecystitis, acute 05/24/2013   RUQ abdominal pain 05/13/2013   Acute cholecystitis: Probable 05/13/2013   Constipation 05/13/2013   Enterococcus UTI 05/13/2013   Leukocytosis 05/13/2013   Anemia 05/13/2013   Hypertension    BPH (benign prostatic hyperplasia)    Pruritic disorder 02/02/2011   PCP:  [name], MD Pharmacy:   [id number] [id number]: [phone]: [phone]    Social Determinants of Health (SDOH) Social History: SDOH Screenings   Food Insecurity: No Food Insecurity (08/09/2022)  Housing: Low Risk  (08/09/2022)  Transportation Needs: No Transportation Needs (08/09/2022)  Utilities: Not At Risk (08/09/2022)  Tobacco Use: Low Risk  (08/08/2022)   SDOH Interventions:     Readmission Risk Interventions     No data to display

## 2022-08-09 NOTE — Progress Notes (Signed)
Provider was asked if this patient needs an order for SCD's. No further orders nor interventions were placed at this time. Patient currently receiving SQ Lovenox. Will continue to monitor.

## 2022-08-09 NOTE — Plan of Care (Signed)
  Problem: Clinical Measurements: ?Goal: Diagnostic test results will improve ?Outcome: Progressing ?Goal: Respiratory complications will improve ?Outcome: Progressing ?  ?Problem: Activity: ?Goal: Risk for activity intolerance will decrease ?Outcome: Progressing ?  ?Problem: Coping: ?Goal: Level of anxiety will decrease ?Outcome: Progressing ?  ?

## 2022-08-09 NOTE — Significant Event (Signed)
Rapid Response Event Note   Reason for Call :  hypotension  Initial Focused Assessment:  Patient is drowsy he will open his eyes to light stimulation and mumbles, yes no answers.  He is warm and dry. Lung sounds clear, decreased bases Heart tones regular  BP 63/47  SR 83  O2 sat 100% on RA temp 98.0 axillary    Interventions:  1L NS bolus  BP improved to 84/49-90/51 with fluids infusing Drowsy, oriented x 2  Additional 500 cc NS bolus   Plan of Care:  NS at 75cc/hour  Event Summary:   MD Notified: Dr Avon Gully (prior to my arrival) Call Time: [place] Arrival Time: [place] End Time: 1850  [name], RN

## 2022-08-09 NOTE — Progress Notes (Signed)
Was called to room by tech because BP was in the 60s over 30s when she entered the room and pt was lethargic.  He had been more alert all day and even though would be asleep, would open eyes when he heard staff talking and was even initiating conversations.  Paged Dr Avon Gully for a bolus order and placed him in reverse trendelenberg.  Upon assessment, he was sluggish and was not responding at first then, after sternal rub he did respond minimally.  His BP was fluctuating and still was in 0000000 and 70 systolic.  We called Rapid response to lay eyes on him to see if we need to do anything else.  As the bolus gets in more, he is coming around and responding more.  Dr Avon Gully came up as well and he ordered another bolus after this and placed him on maintenance fluids, as he was admitted for the same symptoms.  I did call wife and notified her of what is going on.  Charge nurse was aware and at bedside with me.

## 2022-08-09 NOTE — Progress Notes (Signed)
PROGRESS NOTE    Mike Mack  [id number] DOB: April 14, 1936 DOA: 08/08/2022 PCP: [name], MD   Brief Narrative:  Mike Mack is a 87 y.o. male with medical history significant of diabetes, GI bleed, hypertension, CKD 3A, BPH, anemia, stroke, polio, dementia presenting with altered mental status and lethargy.   Assessment & Plan:   Principal Problem:   Acute encephalopathy Active Problems:   recent GI bleed in 02/2022   CKD (chronic kidney disease) stage 3, GFR 30-59 ml/min (HCC)   Hypertension   BPH (benign prostatic hyperplasia)   Anemia   H/O: CVA (cerebrovascular accident)   H/O poliomyelitis   Type 2 diabetes mellitus (HCC)   Dementia (HCC)   Glaucoma  Acute encephalopathy UTI, POA Does not meet sepsis criteria -Admits to urinary frequency/dysuria this am at bedside -Mental status continues to be depressed from baseline per wife at bedside -Ceftriaxone x3 days -Follow cultures - UA abnormal but inconclusive  Dementia, chronic Rule out acute delirium/sundowning -More confused than baseline per family -Similar to prior UTI presentations -Questionable acute delirium in his setting of his chronic dementia. -Resp vial panel negative   History of stroke - Continue home atorvastatin and aspirin   Diabetes, well controlled - SSI, follow hypoglycemic protocol   Hypertension - Replace home olmesartan with formulary irbesartan   CKD 3a -Chronic - currently improved from baseline - continue to advance diet as tolerated   BPH - Continue home tamsulosin   Glaucoma - Continue home Eyedrops   DVT prophylaxis: Lovenox Code Status: DNR Family Communication: Wife at bedside  Status is: Inpatient  Dispo: The patient is from: Home              Anticipated d/c is to: Home              Anticipated d/c date is: 24 to 48 hours              Patient currently not medically stable for discharge given ongoing need for IV antibiotics close monitoring and  ongoing encephalopathy  Consultants:  None  Procedures:  None  Antimicrobials:  Ceftriaxone x 3 days  Subjective: No acute issues or events overnight, mental status minimally improving but not yet back to baseline per wife at bedside.  Review of systems somewhat limited due to patient's mental status  Objective: Vitals:   08/08/22 2359 08/09/22 0353 08/09/22 0822 08/09/22 1100  BP: 103/65 139/78 110/68   Pulse: 80 85 85   Resp: 16 17 15   $ Temp:   97.6 F (36.4 C)   TempSrc:   Oral   SpO2: 96% 97% 96%   Height:    5' 5"$  (1.651 m)    Intake/Output Summary (Last 24 hours) at 08/09/2022 1124 Last data filed at 08/09/2022 1000 Gross per 24 hour  Intake 963 ml  Output 300 ml  Net 663 ml   There were no vitals filed for this visit.  Examination:  General:  Pleasantly resting in bed, No acute distress. HEENT:  Normocephalic atraumatic.  Sclerae nonicteric, noninjected.  Extraocular movements intact bilaterally. Neck:  Without mass or deformity.  Trachea is midline. Lungs:  Clear to auscultate bilaterally without rhonchi, wheeze, or rales. Heart:  Regular rate and rhythm.  Without murmurs, rubs, or gallops. Abdomen:  Soft, nontender, nondistended.  Without guarding or rebound. Skin:  Warm and dry, no erythema, no rashes  Data Reviewed: I have personally reviewed following labs and imaging studies  CBC: Recent Labs  Lab 08/08/22 1026  08/08/22 1038 08/09/22 0649  WBC 6.8  --  7.0  NEUTROABS 4.4  --   --   HGB 12.3* 13.3 11.1*  HCT 38.3* 39.0 35.0*  MCV 91.8  --  91.4  PLT 265  --  0000000   Basic Metabolic Panel: Recent Labs  Lab 08/08/22 1026 08/08/22 1038 08/08/22 1836 08/09/22 0649  NA 138 142  --  140  K 4.1 4.2  --  4.5  CL 106  --   --  109  CO2 24  --   --  21*  GLUCOSE 124*  --   --  75  BUN 16  --   --  15  CREATININE 1.25*  --   --  1.19  CALCIUM 9.4  --   --  9.0  MG  --   --  1.9  --    GFR: Estimated Creatinine Clearance: 45.3 mL/min (by C-G  formula based on SCr of 1.19 mg/dL). Liver Function Tests: Recent Labs  Lab 08/08/22 1026 08/09/22 0649  AST 11* 11*  ALT 9 9  ALKPHOS 106 98  BILITOT 0.6 0.9  PROT 7.6 6.9  ALBUMIN 3.3* 3.1*   No results for input(s): "LIPASE", "AMYLASE" in the last 168 hours. Recent Labs  Lab 08/08/22 1026  AMMONIA 15   Coagulation Profile: No results for input(s): "INR", "PROTIME" in the last 168 hours. Cardiac Enzymes: No results for input(s): "CKTOTAL", "CKMB", "CKMBINDEX", "TROPONINI" in the last 168 hours. BNP (last 3 results) No results for input(s): "PROBNP" in the last 8760 hours. HbA1C: No results for input(s): "HGBA1C" in the last 72 hours. CBG: Recent Labs  Lab 08/08/22 1540 08/08/22 2035 08/08/22 2334 08/09/22 0323 08/09/22 0718  GLUCAP 86 87 100* 95 75   Lipid Profile: No results for input(s): "CHOL", "HDL", "LDLCALC", "TRIG", "CHOLHDL", "LDLDIRECT" in the last 72 hours. Thyroid Function Tests: Recent Labs    08/08/22 1836  TSH 1.051   Anemia Panel: No results for input(s): "VITAMINB12", "FOLATE", "FERRITIN", "TIBC", "IRON", "RETICCTPCT" in the last 72 hours. Sepsis Labs: No results for input(s): "PROCALCITON", "LATICACIDVEN" in the last 168 hours.  Recent Results (from the past 240 hour(s))  Resp panel by RT-PCR (RSV, Flu A&B, Covid) Anterior Nasal Swab     Status: None   Collection Time: 08/08/22  4:11 PM   Specimen: Anterior Nasal Swab  Result Value Ref Range Status   SARS Coronavirus 2 by RT PCR NEGATIVE NEGATIVE Final   Influenza A by PCR NEGATIVE NEGATIVE Final   Influenza B by PCR NEGATIVE NEGATIVE Final    Comment: (NOTE) The Xpert Xpress SARS-CoV-2/FLU/RSV plus assay is intended as an aid in the diagnosis of influenza from Nasopharyngeal swab specimens and should not be used as a sole basis for treatment. Nasal washings and aspirates are unacceptable for Xpert Xpress SARS-CoV-2/FLU/RSV testing.  Fact Sheet for  Patients: EntrepreneurPulse.com.au  Fact Sheet for Healthcare Providers: IncredibleEmployment.be  This test is not yet approved or cleared by the [place] FDA and has been authorized for detection and/or diagnosis of SARS-CoV-2 by FDA under an Emergency Use Authorization (EUA). This EUA will remain in effect (meaning this test can be used) for the duration of the COVID-19 declaration under Section 564(b)(1) of the Act, 21 U.S.C. section 360bbb-3(b)(1), unless the authorization is terminated or revoked.     Resp Syncytial Virus by PCR NEGATIVE NEGATIVE Final    Comment: (NOTE) Fact Sheet for Patients: EntrepreneurPulse.com.au  Fact Sheet for Healthcare Providers: IncredibleEmployment.be  This  test is not yet approved or cleared by the [place] and has been authorized for detection and/or diagnosis of SARS-CoV-2 by FDA under an Emergency Use Authorization (EUA). This EUA will remain in effect (meaning this test can be used) for the duration of the COVID-19 declaration under Section 564(b)(1) of the Act, 21 U.S.C. section 360bbb-3(b)(1), unless the authorization is terminated or revoked.  Performed at [facility], [place] [address], [place], [place] [address]          Radiology Studies: CT HEAD CODE STROKE WO CONTRAST  Result Date: 08/08/2022 CLINICAL DATA:  Code stroke. EXAM: CT HEAD WITHOUT CONTRAST TECHNIQUE: Contiguous axial images were obtained from the base of the skull through the vertex without intravenous contrast. RADIATION DOSE REDUCTION: This exam was performed according to the departmental dose-optimization program which includes automated exposure control, adjustment of the mA and/or kV according to patient size and/or use of iterative reconstruction technique. COMPARISON:  CT head 07/28/2022 FINDINGS: Brain: There is no evidence of acute intracranial hemorrhage, extra-axial  fluid collection, or acute territorial infarct. There is unchanged prominence of the ventricular system out of proportion to the degree of sulcal widening. Multiple remote infarcts in the bilateral cerebellar hemispheres and background chronic small-vessel ischemic change are stable. The pituitary and suprasellar region are normal. There is no mass lesion. There is no mass effect or midline shift. Vascular: No hyperdense vessel or unexpected calcification. Skull: Normal. Negative for fracture or focal lesion. Sinuses/Orbits: The imaged paranasal sinuses are clear. Bilateral lens implants are in place. The globes and orbits are otherwise unremarkable. Other: A right mastoid effusion is unchanged. ASPECTS ([place] Stroke Program Early CT Score) - Ganglionic level infarction (caudate, lentiform nuclei, internal capsule, insula, M1-M3 cortex): 7 - Supraganglionic infarction (M4-M6 cortex): 3 Total score (0-10 with 10 being normal): 10 IMPRESSION: Stable noncontrast head CT with no acute intracranial pathology. Findings communicated to Dr. Curly Shores via Amion at 10:43 a.m. Electronically Signed   By: Valetta Mole M.D.   On: 08/08/2022 10:47   DG Chest Portable 1 View  Result Date: 08/08/2022 CLINICAL DATA:  Eval infection EXAM: PORTABLE CHEST 1 VIEW COMPARISON:  Radiograph 07/08/2022 FINDINGS: Unchanged cardiomediastinal silhouette. There are small pleural effusions with bibasilar opacities. No pneumothorax. No acute osseous abnormality. IMPRESSION: Small pleural effusions with adjacent bibasilar opacities, could be atelectasis or infection. Electronically Signed   By: Maurine Simmering M.D.   On: 08/08/2022 10:26    Scheduled Meds:  aspirin EC  81 mg Oral Daily   atorvastatin  20 mg Oral QPM   brimonidine  1 drop Both Eyes TID   dorzolamide-timolol  1 drop Both Eyes BID   enoxaparin (LOVENOX) injection  40 mg Subcutaneous Q24H   feeding supplement (GLUCERNA SHAKE)  237 mL Oral BID BM   insulin aspart  0-9 Units  Subcutaneous TID WC   irbesartan  150 mg Oral Daily   latanoprost  1 drop Both Eyes QHS   levothyroxine  88 mcg Oral QAC breakfast   memantine  10 mg Oral BID   mirtazapine  15 mg Oral QHS   Netarsudil Dimesylate  1 drop Both Eyes QHS   pantoprazole  40 mg Oral Daily   sodium chloride flush  3 mL Intravenous Q12H   tamsulosin  0.4 mg Oral Daily   Continuous Infusions:   LOS: 0 days   Time spent: 63mn  Roshun Klingensmith C Pelagia Iacobucci, DO [facility]  If 7PM-7AM, please contact night-coverage [email or web address]  08/09/2022, 11:24  AM

## 2022-08-10 DIAGNOSIS — G9340 Encephalopathy, unspecified: Secondary | ICD-10-CM | POA: Diagnosis not present

## 2022-08-10 LAB — GLUCOSE, CAPILLARY
Glucose-Capillary: 80 mg/dL (ref 70–99)
Glucose-Capillary: 167 mg/dL — ABNORMAL HIGH (ref 70–99)
Glucose-Capillary: 182 mg/dL — ABNORMAL HIGH (ref 70–99)
Glucose-Capillary: 108 mg/dL — ABNORMAL HIGH (ref 70–99)

## 2022-08-10 NOTE — Progress Notes (Addendum)
Nutrition Brief Note  Patient identified on the Malnutrition Screening Tool (MST) Report. Met with pt and wife in room. Pt awake and alert but wife provided hx. Reports that pt has a great appetite at baseline and even with his increased confusion prior to admission, he was still eating well. Admitting provider added glucerna for pt which is noted at bedside ~75% consumed.   No weight loss noted and wife reports stable weight PTA. Pt is wheelchair bound at baseline and right leg affected by polio as a child.  Wt Readings from Last 15 Encounters:  08/09/22 81 kg  07/28/22 81 kg  07/08/22 81.6 kg  03/09/22 78.4 kg  02/02/22 74.8 kg  02/22/21 75 kg  12/09/20 83.6 kg  03/19/20 80.3 kg  12/07/19 83.9 kg  11/20/19 81.6 kg  08/24/19 86.2 kg  03/23/19 83 kg  10/03/18 83.2 kg  04/18/17 77.6 kg  12/03/14 77.5 kg    Body mass index is 29.72 kg/m. Weight WNL for geriatric BMI.  Current diet order is Regular. Labs and medications reviewed.   No nutrition interventions warranted at this time. If nutrition issues arise, please consult RD.    Ranell Patrick, RD, LDN Clinical Dietitian RD pager # available in [place]  After hours/weekend pager # available in [facility]

## 2022-08-10 NOTE — Progress Notes (Signed)
PROGRESS NOTE    Mike Mack  [id number] DOB: 10-Sep-1935 DOA: 08/08/2022 PCP: [name], MD   Brief Narrative:  Mike Mack is a 87 y.o. male with medical history significant of diabetes, GI bleed, hypertension, CKD 3A, BPH, anemia, stroke, polio, dementia presenting with altered mental status and lethargy.   Assessment & Plan:   Principal Problem:   Acute encephalopathy Active Problems:   recent GI bleed in 02/2022   CKD (chronic kidney disease) stage 3, GFR 30-59 ml/min (HCC)   Hypertension   BPH (benign prostatic hyperplasia)   Anemia   H/O: CVA (cerebrovascular accident)   H/O poliomyelitis   Type 2 diabetes mellitus (HCC)   Dementia (HCC)   Glaucoma   Acute metabolic encephalopathy  Acute encephalopathy UTI, POA Does not meet sepsis criteria -Admits to urinary frequency/dysuria this am at bedside -Mental status continues to be depressed from baseline per wife at bedside -Ceftriaxone x3 days -Follow cultures - UA abnormal but inconclusive  Dementia, chronic Rule out acute delirium/sundowning -More confused than baseline per family -Similar to prior UTI presentations -Questionable acute delirium in his setting of his chronic dementia. -Resp vial panel negative   History of stroke - Continue home atorvastatin and aspirin   Diabetes, well controlled - SSI, follow hypoglycemic protocol   Hypertension, essential -Transient episode of hypotension late yesterday evening, improved with IV fluid challenge, discontinue home antihypertensive medications and follow clinically.   CKD 3a -Chronic - currently improved from baseline - continue to advance diet/PO intake as tolerated   BPH - Continue home tamsulosin   Glaucoma - Continue home Eyedrops   DVT prophylaxis: Lovenox Code Status: DNR Family Communication: Wife at bedside  Status is: Inpatient  Dispo: The patient is from: Home              Anticipated d/c is to: Home               Anticipated d/c date is: 24 to 48 hours              Patient currently not medically stable for discharge given ongoing need for IV antibiotics close monitoring and ongoing encephalopathy with profound hypotension overnight  Consultants:  None  Procedures:  None  Antimicrobials:  Ceftriaxone x 3 days  Subjective: Patient moderately hypotensive overnight requiring rapid response and IV fluid challenge, improving at this time, appears much more awake alert oriented and near baseline per wife at bedside.  Patient denies nausea vomiting diarrhea constipation headache fevers chills chest pain shortness of breath.  Objective: Vitals:   08/09/22 1842 08/09/22 1941 08/09/22 2319 08/10/22 0504  BP: 111/61 123/62 (!) 134/96 (!) 164/76  Pulse:  73 77 87  Resp: 16 14 15 17  $ Temp:  98.4 F (36.9 C) 97.8 F (36.6 C) 97.9 F (36.6 C)  TempSrc:  Oral Oral Oral  SpO2: 100% 100% 100% 100%  Weight:      Height:        Intake/Output Summary (Last 24 hours) at 08/10/2022 0803 Last data filed at 08/10/2022 0517 Gross per 24 hour  Intake 429 ml  Output 950 ml  Net -521 ml    Filed Weights   08/09/22 1100  Weight: 81 kg    Examination:  General:  Pleasantly resting in bed, No acute distress. HEENT:  Normocephalic atraumatic.  Sclerae nonicteric, noninjected.  Extraocular movements intact bilaterally. Neck:  Without mass or deformity.  Trachea is midline. Lungs:  Clear to auscultate bilaterally without rhonchi, wheeze, or rales.  Heart:  Regular rate and rhythm.  Without murmurs, rubs, or gallops. Abdomen:  Soft, nontender, nondistended.  Without guarding or rebound. Skin:  Warm and dry, no erythema, no rashes  Data Reviewed: I have personally reviewed following labs and imaging studies  CBC: Recent Labs  Lab 08/08/22 1026 08/08/22 1038 08/09/22 0649  WBC 6.8  --  7.0  NEUTROABS 4.4  --   --   HGB 12.3* 13.3 11.1*  HCT 38.3* 39.0 35.0*  MCV 91.8  --  91.4  PLT 265  --  261     Basic Metabolic Panel: Recent Labs  Lab 08/08/22 1026 08/08/22 1038 08/08/22 1836 08/09/22 0649  NA 138 142  --  140  K 4.1 4.2  --  4.5  CL 106  --   --  109  CO2 24  --   --  21*  GLUCOSE 124*  --   --  75  BUN 16  --   --  15  CREATININE 1.25*  --   --  1.19  CALCIUM 9.4  --   --  9.0  MG  --   --  1.9  --     GFR: Estimated Creatinine Clearance: 45.3 mL/min (by C-G formula based on SCr of 1.19 mg/dL). Liver Function Tests: Recent Labs  Lab 08/08/22 1026 08/09/22 0649  AST 11* 11*  ALT 9 9  ALKPHOS 106 98  BILITOT 0.6 0.9  PROT 7.6 6.9  ALBUMIN 3.3* 3.1*    No results for input(s): "LIPASE", "AMYLASE" in the last 168 hours. Recent Labs  Lab 08/08/22 1026  AMMONIA 15    CBG: Recent Labs  Lab 08/09/22 1145 08/09/22 1614 08/09/22 1745 08/09/22 2119 08/10/22 0631  GLUCAP 166* 97 116* 94 80    Thyroid Function Tests: Recent Labs    08/08/22 1836  TSH 1.051    Recent Results (from the past 240 hour(s))  Resp panel by RT-PCR (RSV, Flu A&B, Covid) Anterior Nasal Swab     Status: None   Collection Time: 08/08/22  4:11 PM   Specimen: Anterior Nasal Swab  Result Value Ref Range Status   SARS Coronavirus 2 by RT PCR NEGATIVE NEGATIVE Final   Influenza A by PCR NEGATIVE NEGATIVE Final   Influenza B by PCR NEGATIVE NEGATIVE Final    Comment: (NOTE) The Xpert Xpress SARS-CoV-2/FLU/RSV plus assay is intended as an aid in the diagnosis of influenza from Nasopharyngeal swab specimens and should not be used as a sole basis for treatment. Nasal washings and aspirates are unacceptable for Xpert Xpress SARS-CoV-2/FLU/RSV testing.  Fact Sheet for Patients: EntrepreneurPulse.com.au  Fact Sheet for Healthcare Providers: IncredibleEmployment.be  This test is not yet approved or cleared by the [place] FDA and has been authorized for detection and/or diagnosis of SARS-CoV-2 by FDA under an Emergency Use  Authorization (EUA). This EUA will remain in effect (meaning this test can be used) for the duration of the COVID-19 declaration under Section 564(b)(1) of the Act, 21 U.S.C. section 360bbb-3(b)(1), unless the authorization is terminated or revoked.     Resp Syncytial Virus by PCR NEGATIVE NEGATIVE Final    Comment: (NOTE) Fact Sheet for Patients: EntrepreneurPulse.com.au  Fact Sheet for Healthcare Providers: IncredibleEmployment.be  This test is not yet approved or cleared by the [place] FDA and has been authorized for detection and/or diagnosis of SARS-CoV-2 by FDA under an Emergency Use Authorization (EUA). This EUA will remain in effect (meaning this test can be used) for the  duration of the COVID-19 declaration under Section 564(b)(1) of the Act, 21 U.S.C. section 360bbb-3(b)(1), unless the authorization is terminated or revoked.  Performed at [facility], [place] [address], [place], [place] [address]          Radiology Studies: CT HEAD CODE STROKE WO CONTRAST  Result Date: 08/08/2022 CLINICAL DATA:  Code stroke. EXAM: CT HEAD WITHOUT CONTRAST TECHNIQUE: Contiguous axial images were obtained from the base of the skull through the vertex without intravenous contrast. RADIATION DOSE REDUCTION: This exam was performed according to the departmental dose-optimization program which includes automated exposure control, adjustment of the mA and/or kV according to patient size and/or use of iterative reconstruction technique. COMPARISON:  CT head 07/28/2022 FINDINGS: Brain: There is no evidence of acute intracranial hemorrhage, extra-axial fluid collection, or acute territorial infarct. There is unchanged prominence of the ventricular system out of proportion to the degree of sulcal widening. Multiple remote infarcts in the bilateral cerebellar hemispheres and background chronic small-vessel ischemic change are stable. The pituitary and  suprasellar region are normal. There is no mass lesion. There is no mass effect or midline shift. Vascular: No hyperdense vessel or unexpected calcification. Skull: Normal. Negative for fracture or focal lesion. Sinuses/Orbits: The imaged paranasal sinuses are clear. Bilateral lens implants are in place. The globes and orbits are otherwise unremarkable. Other: A right mastoid effusion is unchanged. ASPECTS ([place] Stroke Program Early CT Score) - Ganglionic level infarction (caudate, lentiform nuclei, internal capsule, insula, M1-M3 cortex): 7 - Supraganglionic infarction (M4-M6 cortex): 3 Total score (0-10 with 10 being normal): 10 IMPRESSION: Stable noncontrast head CT with no acute intracranial pathology. Findings communicated to Dr. Curly Shores via Amion at 10:43 a.m. Electronically Signed   By: Valetta Mole M.D.   On: 08/08/2022 10:47   DG Chest Portable 1 View  Result Date: 08/08/2022 CLINICAL DATA:  Eval infection EXAM: PORTABLE CHEST 1 VIEW COMPARISON:  Radiograph 07/08/2022 FINDINGS: Unchanged cardiomediastinal silhouette. There are small pleural effusions with bibasilar opacities. No pneumothorax. No acute osseous abnormality. IMPRESSION: Small pleural effusions with adjacent bibasilar opacities, could be atelectasis or infection. Electronically Signed   By: Maurine Simmering M.D.   On: 08/08/2022 10:26    Scheduled Meds:  aspirin EC  81 mg Oral Daily   atorvastatin  20 mg Oral QPM   brimonidine  1 drop Both Eyes TID   dorzolamide-timolol  1 drop Both Eyes BID   enoxaparin (LOVENOX) injection  40 mg Subcutaneous Q24H   feeding supplement (GLUCERNA SHAKE)  237 mL Oral BID BM   insulin aspart  0-9 Units Subcutaneous TID WC   latanoprost  1 drop Both Eyes QHS   levothyroxine  88 mcg Oral QAC breakfast   memantine  10 mg Oral BID   mirtazapine  15 mg Oral QHS   Netarsudil Dimesylate  1 drop Both Eyes QHS   pantoprazole  40 mg Oral Daily   sodium chloride flush  3 mL Intravenous Q12H   tamsulosin   0.4 mg Oral Daily   Continuous Infusions:  cefTRIAXone (ROCEPHIN)  IV 1 g (08/09/22 1339)     LOS: 1 day   Time spent: 50mn  Azalea Cedar C Deunta Beneke, DO [facility]  If 7PM-7AM, please contact night-coverage [email or web address]  08/10/2022, 8:03 AM

## 2022-08-11 LAB — GLUCOSE, CAPILLARY: Glucose-Capillary: 102 mg/dL — ABNORMAL HIGH (ref 70–99)

## 2022-08-11 MED ORDER — AMLODIPINE BESYLATE 2.5 MG PO TABS
2.5000 mg | ORAL_TABLET | Freq: Every day | ORAL | Status: DC
  Administered 2022-08-11: 2.5 mg via ORAL
  Filled 2022-08-11: qty 1

## 2022-08-11 MED ORDER — AMLODIPINE BESYLATE 2.5 MG PO TABS: 2.5000 mg | ORAL_TABLET | Freq: Every day | ORAL | 1 refills | Status: DC

## 2022-08-11 MED ORDER — IRBESARTAN 150 MG PO TABS
150.0000 mg | ORAL_TABLET | Freq: Every day | ORAL | Status: DC
  Administered 2022-08-11: 150 mg via ORAL
  Filled 2022-08-11: qty 1

## 2022-08-11 NOTE — TOC Transition Note (Signed)
Transition of Care [facility]) - CM/SW Discharge Note   Patient Details  Name: Mike Mack MRN: [id number] Date of Birth: [birth year]  [facility] CM/SW Contact:  Pollie Friar, RN Phone Number: 08/11/2022, 9:41 AM   Clinical Narrative:    Pt is discharging home with self care. Pt's family provides his needed care.  Wife states they can provide transport home.    Final next level of care: Home/Self Care Barriers to Discharge: No Barriers Identified   Patient Goals and CMS Choice CMS Medicare.gov Compare Post Acute Care list provided to:: Patient Represenative (must comment) Choice offered to / list presented to : Spouse  Discharge Placement                         Discharge Plan and Services Additional resources added to the After Visit Summary for     Discharge Planning Services: CM Consult                                 Social Determinants of Health (SDOH) Interventions SDOH Screenings   Food Insecurity: No Food Insecurity (08/09/2022)  Housing: Low Risk  (08/09/2022)  Transportation Needs: No Transportation Needs (08/09/2022)  Utilities: Not At Risk (08/09/2022)  Tobacco Use: Low Risk  (08/08/2022)     Readmission Risk Interventions     No data to display

## 2022-08-11 NOTE — Progress Notes (Signed)
Discharge instructions discussed with patient and wife. All questions answered. Iv removed and telemetry discontinued. Patient belongings packed and sent with wife. Patient to be wheeled off unit for transport home by wife.  Gwendolyn Grant, RN

## 2022-08-11 NOTE — Discharge Summary (Signed)
Physician Discharge Summary  Mike Mack [id number] DOB: 1936-04-24 DOA: 08/08/2022  PCP: [name], MD  Admit date: 08/08/2022 Discharge date: 08/11/2022  Admitted From: Home Disposition: Home  Recommendations for Outpatient Follow-up:  Follow up with PCP in 1-2 weeks Follow blood pressure at home and with PCP in the next 2 weeks closely given medication changes as below  Home Health: None Equipment/Devices: No new equipment required  Discharge Condition: Stable CODE STATUS: DNR Diet recommendation: As tolerated, recommended increased free water intake  Brief/Interim Summary: Mike Mack is a 87 y.o. male with medical history significant of diabetes, GI bleed, hypertension, CKD 3A, BPH, anemia, stroke, polio, dementia presenting with altered mental status and lethargy.   Patient admitted with presumed UTI given mental status changes and abnormal UA and symptoms.  Patient generally improving however had a transient episode of hypotension late on 08/09/2022, presumed to be hypovolemic in nature but cannot rule out polypharmacy versus his acute infection playing some role.  After fluid challenge and decreased home amlodipine patient's blood pressure improved drastically.  We recommend close follow-up at home as well as with PCP for repeat evaluation, at this time would continue to recommend weaning patient's blood pressure medications as tolerated given his profound symptoms here while hypotensive.  Patient otherwise stable and family is agreeable to return home with patient later today.  Discharge Diagnoses:  Principal Problem:   Acute encephalopathy Active Problems:   recent GI bleed in 02/2022   CKD (chronic kidney disease) stage 3, GFR 30-59 ml/min (HCC)   Hypertension   BPH (benign prostatic hyperplasia)   Anemia   H/O: CVA (cerebrovascular accident)   H/O poliomyelitis   Type 2 diabetes mellitus (HCC)   Dementia (HCC)   Glaucoma   Acute metabolic  encephalopathy  Acute encephalopathy UTI, POA, resolved, resolved Does not meet sepsis criteria -Admits to urinary frequency/dysuria at bedside -Mental status continues to be depressed from baseline per wife at bedside -Ceftriaxone completed -repeat evaluation with PCP in 1 to 2 weeks to ensure resolution of infection   Dementia, chronic Rule out acute delirium/sundowning -More confused than baseline per family at intake which is similar to prior UTI presentations -Questionable acute delirium in his setting of his chronic dementia. -Resp vial panel negative -Patient now back to baseline, presumed to be secondary to UTI given rapid improvement with antibiotic course   History of stroke - Continue home atorvastatin and aspirin   Diabetes, well controlled - SSI, follow hypoglycemic protocol   Hypertension, essential -Transient episode of hypotension improving with IV fluids and decrease dose of amlodipine.  Repeat blood pressure testing at home as well as with PCP next week.   CKD 3a -Chronic - currently improved from baseline - continue to advance diet/PO intake as tolerated   BPH - Continue home tamsulosin   Glaucoma - Continue home Eyedrops  Discharge Instructions   Allergies as of 08/11/2022       Reactions   Metformin And Related Other (See Comments)   Gi intolerance   [place] [insulin Glargine-lixisenatide] Other (See Comments)   Stomach cramps        Medication List     TAKE these medications    acetaminophen 325 MG tablet Commonly known as: TYLENOL Take 2 tablets (650 mg total) by mouth every 6 (six) hours as needed for mild pain (or Fever >/= 101).   amLODipine 2.5 MG tablet Commonly known as: NORVASC Take 1 tablet (2.5 mg total) by mouth daily. What changed:  medication strength  how much to take   aspirin EC 81 MG tablet Take 1 tablet (81 mg total) by mouth daily. Swallow whole.   atorvastatin 20 MG tablet Commonly known as: LIPITOR Take 20 mg  by mouth every evening.   B COMPLEX PO Take 1 tablet by mouth at bedtime.   brimonidine 0.2 % ophthalmic solution Commonly known as: ALPHAGAN Place 1 drop into both eyes 3 (three) times daily.   cholecalciferol 10 MCG (400 UNIT) Tabs tablet Commonly known as: VITAMIN D3 Take 400 Units by mouth daily.   docusate sodium 100 MG capsule Commonly known as: COLACE Take 1 capsule (100 mg total) by mouth 2 (two) times daily as needed for mild constipation.   dorzolamide-timolol 2-0.5 % ophthalmic solution Commonly known as: COSOPT Place 1 drop into both eyes 2 (two) times daily.   feeding supplement (GLUCERNA SHAKE) Liqd Take 237 mLs by mouth 3 (three) times daily between meals.   Finerenone 10 MG Tabs Take 1 tablet by mouth daily.   memantine 10 MG tablet Commonly known as: NAMENDA Take 10 mg by mouth in the morning and at bedtime.   mirtazapine 15 MG disintegrating tablet Commonly known as: REMERON SOL-TAB Take 1 tablet (15 mg total) by mouth at bedtime.   olmesartan 20 MG tablet Commonly known as: BENICAR Take 20 mg by mouth daily.   pantoprazole 40 MG tablet Commonly known as: Protonix Take 1 tablet (40 mg total) by mouth daily.   polyethylene glycol 17 g packet Commonly known as: MIRALAX / GLYCOLAX Take 17 g by mouth daily. What changed:  when to take this reasons to take this   Rhopressa 0.02 % Soln Generic drug: Netarsudil Dimesylate Place 1 drop into both eyes at bedtime.   Synthroid 88 MCG tablet Generic drug: levothyroxine Take 88 mcg by mouth daily before breakfast.   tamsulosin 0.4 MG Caps capsule Commonly known as: FLOMAX Take 0.4 mg by mouth in the morning and at bedtime.        Allergies  Allergen Reactions   Metformin And Related Other (See Comments)    Gi intolerance   [name] [Insulin Glargine-Lixisenatide] Other (See Comments)    Stomach cramps    Consultations: None  Procedures/Studies: CT HEAD CODE STROKE WO CONTRAST  Result  Date: 08/08/2022 CLINICAL DATA:  Code stroke. EXAM: CT HEAD WITHOUT CONTRAST TECHNIQUE: Contiguous axial images were obtained from the base of the skull through the vertex without intravenous contrast. RADIATION DOSE REDUCTION: This exam was performed according to the departmental dose-optimization program which includes automated exposure control, adjustment of the mA and/or kV according to patient size and/or use of iterative reconstruction technique. COMPARISON:  CT head 07/28/2022 FINDINGS: Brain: There is no evidence of acute intracranial hemorrhage, extra-axial fluid collection, or acute territorial infarct. There is unchanged prominence of the ventricular system out of proportion to the degree of sulcal widening. Multiple remote infarcts in the bilateral cerebellar hemispheres and background chronic small-vessel ischemic change are stable. The pituitary and suprasellar region are normal. There is no mass lesion. There is no mass effect or midline shift. Vascular: No hyperdense vessel or unexpected calcification. Skull: Normal. Negative for fracture or focal lesion. Sinuses/Orbits: The imaged paranasal sinuses are clear. Bilateral lens implants are in place. The globes and orbits are otherwise unremarkable. Other: A right mastoid effusion is unchanged. ASPECTS [facility] Stroke Program Early CT Score) - Ganglionic level infarction (caudate, lentiform nuclei, internal capsule, insula, M1-M3 cortex): 7 - Supraganglionic infarction (M4-M6 cortex): 3 Total score (0-10  with 10 being normal): 10 IMPRESSION: Stable noncontrast head CT with no acute intracranial pathology. Findings communicated to Dr. Curly Shores via Amion at 10:43 a.m. Electronically Signed   By: Valetta Mole M.D.   On: 08/08/2022 10:47   DG Chest Portable 1 View  Result Date: 08/08/2022 CLINICAL DATA:  Eval infection EXAM: PORTABLE CHEST 1 VIEW COMPARISON:  Radiograph 07/08/2022 FINDINGS: Unchanged cardiomediastinal silhouette. There are small pleural  effusions with bibasilar opacities. No pneumothorax. No acute osseous abnormality. IMPRESSION: Small pleural effusions with adjacent bibasilar opacities, could be atelectasis or infection. Electronically Signed   By: Maurine Simmering M.D.   On: 08/08/2022 10:26   CT Head Wo Contrast  Result Date: 07/28/2022 CLINICAL DATA:  Headache, sudden, severe. EXAM: CT HEAD WITHOUT CONTRAST TECHNIQUE: Contiguous axial images were obtained from the base of the skull through the vertex without intravenous contrast. RADIATION DOSE REDUCTION: This exam was performed according to the departmental dose-optimization program which includes automated exposure control, adjustment of the mA and/or kV according to patient size and/or use of iterative reconstruction technique. COMPARISON:  Head CT 04/01/2022. FINDINGS: Brain: No acute intracranial hemorrhage. Unchanged severe chronic small-vessel disease. Gray-white differentiation is otherwise preserved. Unchanged disproportionate enlargement of the lateral and third ventricles with acute callosal angle and elevated Evans index. No extra-axial collection. Vascular: No hyperdense vessel or unexpected calcification. Skull: No calvarial fracture or suspicious bone lesion. Skull base is unremarkable. Sinuses/Orbits: Unremarkable. Other: None. IMPRESSION: 1. No acute intracranial abnormality. 2. Unchanged disproportionate enlargement of the lateral and third ventricles with acute callosal angle and elevated Evans index. This can be seen with idiopathic normal pressure hydrocephalus in the appropriate clinical setting. 3. Unchanged severe chronic small-vessel disease. Electronically Signed   By: Emmit Alexanders M.D.   On: 07/28/2022 15:12     Subjective: No acute issues or events overnight denies nausea vomiting diarrhea constipation headache fevers chills or chest pain   Discharge Exam: Vitals:   08/11/22 0800 08/11/22 0812  BP:  (!) 150/79  Pulse:  76  Resp: (!) 26 19  Temp:  98.2 F  (36.8 C)  SpO2:  100%   Vitals:   08/11/22 0310 08/11/22 0500 08/11/22 0800 08/11/22 0812  BP:    (!) 150/79  Pulse: 82   76  Resp:   (!) 26 19  Temp: 97.9 F (36.6 C)   98.2 F (36.8 C)  TempSrc: Oral   Oral  SpO2: 100%   100%  Weight:  64.8 kg    Height:        General: Pt is alert, awake, not in acute distress Cardiovascular: RRR, S1/S2 +, no rubs, no gallops Respiratory: CTA bilaterally, no wheezing, no rhonchi Abdominal: Soft, NT, ND, bowel sounds + Extremities: no edema, no cyanosis    The results of significant diagnostics from this hospitalization (including imaging, microbiology, ancillary and laboratory) are listed below for reference.     Microbiology: Recent Results (from the past 240 hour(s))  Resp panel by RT-PCR (RSV, Flu A&B, Covid) Anterior Nasal Swab     Status: None   Collection Time: 08/08/22  4:11 PM   Specimen: Anterior Nasal Swab  Result Value Ref Range Status   SARS Coronavirus 2 by RT PCR NEGATIVE NEGATIVE Final   Influenza A by PCR NEGATIVE NEGATIVE Final   Influenza B by PCR NEGATIVE NEGATIVE Final    Comment: (NOTE) The Xpert Xpress SARS-CoV-2/FLU/RSV plus assay is intended as an aid in the diagnosis of influenza from Nasopharyngeal swab specimens and  should not be used as a sole basis for treatment. Nasal washings and aspirates are unacceptable for Xpert Xpress SARS-CoV-2/FLU/RSV testing.  Fact Sheet for Patients: EntrepreneurPulse.com.au  Fact Sheet for Healthcare Providers: IncredibleEmployment.be  This test is not yet approved or cleared by the [place] FDA and has been authorized for detection and/or diagnosis of SARS-CoV-2 by FDA under an Emergency Use Authorization (EUA). This EUA will remain in effect (meaning this test can be used) for the duration of the COVID-19 declaration under Section 564(b)(1) of the Act, 21 U.S.C. section 360bbb-3(b)(1), unless the authorization is terminated  or revoked.     Resp Syncytial Virus by PCR NEGATIVE NEGATIVE Final    Comment: (NOTE) Fact Sheet for Patients: EntrepreneurPulse.com.au  Fact Sheet for Healthcare Providers: IncredibleEmployment.be  This test is not yet approved or cleared by the [place] FDA and has been authorized for detection and/or diagnosis of SARS-CoV-2 by FDA under an Emergency Use Authorization (EUA). This EUA will remain in effect (meaning this test can be used) for the duration of the COVID-19 declaration under Section 564(b)(1) of the Act, 21 U.S.C. section 360bbb-3(b)(1), unless the authorization is terminated or revoked.  Performed at [facility], [place] [address] [place], [place] [address]      Labs: BNP (last 3 results) No results for input(s): "BNP" in the last 8760 hours. Basic Metabolic Panel: Recent Labs  Lab 08/08/22 1026 08/08/22 1038 08/08/22 1836 08/09/22 0649  NA 138 142  --  140  K 4.1 4.2  --  4.5  CL 106  --   --  109  CO2 24  --   --  21*  GLUCOSE 124*  --   --  75  BUN 16  --   --  15  CREATININE 1.25*  --   --  1.19  CALCIUM 9.4  --   --  9.0  MG  --   --  1.9  --    Liver Function Tests: Recent Labs  Lab 08/08/22 1026 08/09/22 0649  AST 11* 11*  ALT 9 9  ALKPHOS 106 98  BILITOT 0.6 0.9  PROT 7.6 6.9  ALBUMIN 3.3* 3.1*   No results for input(s): "LIPASE", "AMYLASE" in the last 168 hours. Recent Labs  Lab 08/08/22 1026  AMMONIA 15   CBC: Recent Labs  Lab 08/08/22 1026 08/08/22 1038 08/09/22 0649  WBC 6.8  --  7.0  NEUTROABS 4.4  --   --   HGB 12.3* 13.3 11.1*  HCT 38.3* 39.0 35.0*  MCV 91.8  --  91.4  PLT 265  --  261   Cardiac Enzymes: No results for input(s): "CKTOTAL", "CKMB", "CKMBINDEX", "TROPONINI" in the last 168 hours. BNP: Invalid input(s): "POCBNP" CBG: Recent Labs  Lab 08/10/22 0631 08/10/22 1229 08/10/22 1626 08/10/22 2107 08/11/22 0606  GLUCAP 80 167* 182* 108* 102*    D-Dimer No results for input(s): "DDIMER" in the last 72 hours. Hgb A1c No results for input(s): "HGBA1C" in the last 72 hours. Lipid Profile No results for input(s): "CHOL", "HDL", "LDLCALC", "TRIG", "CHOLHDL", "LDLDIRECT" in the last 72 hours. Thyroid function studies Recent Labs    08/08/22 1836  TSH 1.051   Anemia work up No results for input(s): "VITAMINB12", "FOLATE", "FERRITIN", "TIBC", "IRON", "RETICCTPCT" in the last 72 hours. Urinalysis    Component Value Date/Time   COLORURINE YELLOW 08/08/2022 1352   APPEARANCEUR HAZY (A) 08/08/2022 1352   LABSPEC 1.016 08/08/2022 1352   PHURINE 5.0 08/08/2022 1352  GLUCOSEU NEGATIVE 08/08/2022 1352   HGBUR LARGE (A) 08/08/2022 1352   BILIRUBINUR NEGATIVE 08/08/2022 1352   KETONESUR 5 (A) 08/08/2022 1352   PROTEINUR 30 (A) 08/08/2022 1352   UROBILINOGEN 0.2 10/25/2014 1520   NITRITE NEGATIVE 08/08/2022 1352   LEUKOCYTESUR LARGE (A) 08/08/2022 1352   Sepsis Labs Recent Labs  Lab 08/08/22 1026 08/09/22 0649  WBC 6.8 7.0   Microbiology Recent Results (from the past 240 hour(s))  Resp panel by RT-PCR (RSV, Flu A&B, Covid) Anterior Nasal Swab     Status: None   Collection Time: 08/08/22  4:11 PM   Specimen: Anterior Nasal Swab  Result Value Ref Range Status   SARS Coronavirus 2 by RT PCR NEGATIVE NEGATIVE Final   Influenza A by PCR NEGATIVE NEGATIVE Final   Influenza B by PCR NEGATIVE NEGATIVE Final    Comment: (NOTE) The Xpert Xpress SARS-CoV-2/FLU/RSV plus assay is intended as an aid in the diagnosis of influenza from Nasopharyngeal swab specimens and should not be used as a sole basis for treatment. Nasal washings and aspirates are unacceptable for Xpert Xpress SARS-CoV-2/FLU/RSV testing.  Fact Sheet for Patients: EntrepreneurPulse.com.au  Fact Sheet for Healthcare Providers: IncredibleEmployment.be  This test is not yet approved or cleared by the [place] FDA and has  been authorized for detection and/or diagnosis of SARS-CoV-2 by FDA under an Emergency Use Authorization (EUA). This EUA will remain in effect (meaning this test can be used) for the duration of the COVID-19 declaration under Section 564(b)(1) of the Act, 21 U.S.C. section 360bbb-3(b)(1), unless the authorization is terminated or revoked.     Resp Syncytial Virus by PCR NEGATIVE NEGATIVE Final    Comment: (NOTE) Fact Sheet for Patients: EntrepreneurPulse.com.au  Fact Sheet for Healthcare Providers: IncredibleEmployment.be  This test is not yet approved or cleared by the [place] FDA and has been authorized for detection and/or diagnosis of SARS-CoV-2 by FDA under an Emergency Use Authorization (EUA). This EUA will remain in effect (meaning this test can be used) for the duration of the COVID-19 declaration under Section 564(b)(1) of the Act, 21 U.S.C. section 360bbb-3(b)(1), unless the authorization is terminated or revoked.  Performed at [facility], [place] [address] [place], [place] [address]      Time coordinating discharge: Over 30 minutes  SIGNED:   Little Ishikawa, DO [facility] 08/11/2022, 1:59 PM Pager   If 7PM-7AM, please contact night-coverage [email or web address]

## 2022-08-11 NOTE — Consult Note (Signed)
   [facility] Inpatient Consult   08/11/2022  Rawland Serino 1935/12/02 [id number]  [place] Organization [ACO] [place] [employer]  Primary Care Provider:  [name], MD, with [facility]   Patient screened for hospitalization with noted high risk score for unplanned readmission risk and to assess for potential [place] Management service needs for post hospital transition for care coordination.  Review of patient's electronic medical record reveals patient is admitted with confusion [per MD H and P, dementia].   Plan:  Patient already transitioned home on rounds. Referral request for community care coordination: to follow up  Of note, [place] does not replace or interfere with any arrangements made by the Inpatient Transition of Care team.  For questions contact:   [name], RN BSN [place]  [phone] business mobile phone Toll free office [phone]  *[place]  [phone] Fax number: [phone] [email or web address]

## 2022-08-21 ENCOUNTER — Other Ambulatory Visit: Payer: Self-pay

## 2022-08-21 ENCOUNTER — Inpatient Hospital Stay (HOSPITAL_COMMUNITY)
Admission: EM | Admit: 2022-08-21 | Discharge: 2022-08-24 | DRG: 689 | Disposition: A | Discharge status: Hospice | Payer: Medicare HMO | Attending: Family Medicine | Admitting: Family Medicine

## 2022-08-21 ENCOUNTER — Emergency Department (HOSPITAL_COMMUNITY): Payer: Medicare HMO

## 2022-08-21 ENCOUNTER — Other Ambulatory Visit (HOSPITAL_COMMUNITY): Payer: Medicare HMO

## 2022-08-21 ENCOUNTER — Encounter (HOSPITAL_COMMUNITY): Payer: Self-pay | Admitting: Emergency Medicine

## 2022-08-21 DIAGNOSIS — D649 Anemia, unspecified: Secondary | ICD-10-CM | POA: Diagnosis present

## 2022-08-21 DIAGNOSIS — D631 Anemia in chronic kidney disease: Secondary | ICD-10-CM | POA: Diagnosis not present

## 2022-08-21 DIAGNOSIS — Z8673 Personal history of transient ischemic attack (TIA), and cerebral infarction without residual deficits: Secondary | ICD-10-CM

## 2022-08-21 DIAGNOSIS — G9341 Metabolic encephalopathy: Secondary | ICD-10-CM | POA: Diagnosis not present

## 2022-08-21 DIAGNOSIS — Z8249 Family history of ischemic heart disease and other diseases of the circulatory system: Secondary | ICD-10-CM

## 2022-08-21 DIAGNOSIS — Z8612 Personal history of poliomyelitis: Secondary | ICD-10-CM | POA: Diagnosis not present

## 2022-08-21 DIAGNOSIS — N1831 Chronic kidney disease, stage 3a: Secondary | ICD-10-CM | POA: Diagnosis not present

## 2022-08-21 DIAGNOSIS — Y731 Therapeutic (nonsurgical) and rehabilitative gastroenterology and urology devices associated with adverse incidents: Secondary | ICD-10-CM | POA: Diagnosis present

## 2022-08-21 DIAGNOSIS — J9811 Atelectasis: Secondary | ICD-10-CM | POA: Diagnosis present

## 2022-08-21 DIAGNOSIS — Z79890 Hormone replacement therapy (postmenopausal): Secondary | ICD-10-CM | POA: Diagnosis not present

## 2022-08-21 DIAGNOSIS — F0390 Unspecified dementia without behavioral disturbance: Secondary | ICD-10-CM | POA: Diagnosis not present

## 2022-08-21 DIAGNOSIS — J90 Pleural effusion, not elsewhere classified: Secondary | ICD-10-CM | POA: Diagnosis not present

## 2022-08-21 DIAGNOSIS — Z993 Dependence on wheelchair: Secondary | ICD-10-CM | POA: Diagnosis not present

## 2022-08-21 DIAGNOSIS — E1122 Type 2 diabetes mellitus with diabetic chronic kidney disease: Secondary | ICD-10-CM | POA: Diagnosis not present

## 2022-08-21 DIAGNOSIS — Z87440 Personal history of urinary (tract) infections: Secondary | ICD-10-CM

## 2022-08-21 DIAGNOSIS — G9389 Other specified disorders of brain: Secondary | ICD-10-CM | POA: Diagnosis present

## 2022-08-21 DIAGNOSIS — I129 Hypertensive chronic kidney disease with stage 1 through stage 4 chronic kidney disease, or unspecified chronic kidney disease: Secondary | ICD-10-CM | POA: Diagnosis present

## 2022-08-21 DIAGNOSIS — N400 Enlarged prostate without lower urinary tract symptoms: Secondary | ICD-10-CM | POA: Diagnosis not present

## 2022-08-21 DIAGNOSIS — R400 Somnolence: Secondary | ICD-10-CM | POA: Diagnosis not present

## 2022-08-21 DIAGNOSIS — Z79899 Other long term (current) drug therapy: Secondary | ICD-10-CM

## 2022-08-21 DIAGNOSIS — Z7982 Long term (current) use of aspirin: Secondary | ICD-10-CM | POA: Diagnosis not present

## 2022-08-21 DIAGNOSIS — G9340 Encephalopathy, unspecified: Secondary | ICD-10-CM | POA: Diagnosis not present

## 2022-08-21 DIAGNOSIS — N183 Chronic kidney disease, stage 3 unspecified: Secondary | ICD-10-CM | POA: Diagnosis present

## 2022-08-21 DIAGNOSIS — F03C11 Unspecified dementia, severe, with agitation: Secondary | ICD-10-CM

## 2022-08-21 DIAGNOSIS — Z8711 Personal history of peptic ulcer disease: Secondary | ICD-10-CM

## 2022-08-21 DIAGNOSIS — T8383XA Hemorrhage of genitourinary prosthetic devices, implants and grafts, initial encounter: Secondary | ICD-10-CM | POA: Diagnosis present

## 2022-08-21 DIAGNOSIS — R58 Hemorrhage, not elsewhere classified: Secondary | ICD-10-CM | POA: Diagnosis not present

## 2022-08-21 DIAGNOSIS — H409 Unspecified glaucoma: Secondary | ICD-10-CM | POA: Diagnosis present

## 2022-08-21 DIAGNOSIS — Z833 Family history of diabetes mellitus: Secondary | ICD-10-CM | POA: Diagnosis not present

## 2022-08-21 DIAGNOSIS — I10 Essential (primary) hypertension: Secondary | ICD-10-CM | POA: Diagnosis present

## 2022-08-21 DIAGNOSIS — N3001 Acute cystitis with hematuria: Secondary | ICD-10-CM | POA: Diagnosis not present

## 2022-08-21 DIAGNOSIS — Z888 Allergy status to other drugs, medicaments and biological substances status: Secondary | ICD-10-CM

## 2022-08-21 DIAGNOSIS — Z87442 Personal history of urinary calculi: Secondary | ICD-10-CM

## 2022-08-21 DIAGNOSIS — R4182 Altered mental status, unspecified: Secondary | ICD-10-CM | POA: Diagnosis not present

## 2022-08-21 DIAGNOSIS — N390 Urinary tract infection, site not specified: Secondary | ICD-10-CM | POA: Diagnosis present

## 2022-08-21 DIAGNOSIS — R059 Cough, unspecified: Secondary | ICD-10-CM | POA: Diagnosis not present

## 2022-08-21 DIAGNOSIS — Z7401 Bed confinement status: Secondary | ICD-10-CM

## 2022-08-21 DIAGNOSIS — Z66 Do not resuscitate: Secondary | ICD-10-CM | POA: Diagnosis present

## 2022-08-21 DIAGNOSIS — E119 Type 2 diabetes mellitus without complications: Secondary | ICD-10-CM

## 2022-08-21 DIAGNOSIS — N3000 Acute cystitis without hematuria: Secondary | ICD-10-CM | POA: Diagnosis not present

## 2022-08-21 DIAGNOSIS — R531 Weakness: Secondary | ICD-10-CM | POA: Diagnosis not present

## 2022-08-21 DIAGNOSIS — Z794 Long term (current) use of insulin: Secondary | ICD-10-CM

## 2022-08-21 LAB — COMPREHENSIVE METABOLIC PANEL
ALT: 11 U/L (ref 0–44)
AST: 15 U/L (ref 15–41)
Albumin: 3.2 g/dL — ABNORMAL LOW (ref 3.5–5.0)
Alkaline Phosphatase: 111 U/L (ref 38–126)
Anion gap: 5 (ref 5–15)
BUN: 17 mg/dL (ref 8–23)
CO2: 24 mmol/L (ref 22–32)
Calcium: 9.1 mg/dL (ref 8.9–10.3)
Chloride: 104 mmol/L (ref 98–111)
Creatinine, Ser: 1.23 mg/dL (ref 0.61–1.24)
GFR, Estimated: 58 mL/min — ABNORMAL LOW (ref >60)
Glucose, Bld: 156 mg/dL — ABNORMAL HIGH (ref 70–99)
Potassium: 4.4 mmol/L (ref 3.5–5.1)
Sodium: 133 mmol/L — ABNORMAL LOW (ref 135–145)
Total Bilirubin: 0.7 mg/dL (ref 0.3–1.2)
Total Protein: 6.6 g/dL (ref 6.5–8.1)

## 2022-08-21 LAB — CBC WITH DIFFERENTIAL/PLATELET
Abs Immature Granulocytes: 0.02 10*3/uL (ref 0.00–0.07)
Basophils Absolute: 0 10*3/uL (ref 0.0–0.1)
Basophils Relative: 0 %
Eosinophils Absolute: 0 10*3/uL (ref 0.0–0.5)
Eosinophils Relative: 0 %
HCT: 40.6 % (ref 39.0–52.0)
Hemoglobin: 13.3 g/dL (ref 13.0–17.0)
Immature Granulocytes: 0 %
Lymphocytes Relative: 24 %
Lymphs Abs: 2.4 10*3/uL (ref 0.7–4.0)
MCH: 30.3 pg (ref 26.0–34.0)
MCHC: 32.8 g/dL (ref 30.0–36.0)
MCV: 92.5 fL (ref 80.0–100.0)
Monocytes Absolute: 0.5 10*3/uL (ref 0.1–1.0)
Monocytes Relative: 5 %
Neutro Abs: 7.1 10*3/uL (ref 1.7–7.7)
Neutrophils Relative %: 71 %
Platelets: 237 10*3/uL (ref 150–400)
RBC: 4.39 MIL/uL (ref 4.22–5.81)
RDW: 17.8 % — ABNORMAL HIGH (ref 11.5–15.5)
WBC: 10.1 10*3/uL (ref 4.0–10.5)
nRBC: 0 % (ref 0.0–0.2)

## 2022-08-21 LAB — URINALYSIS, ROUTINE W REFLEX MICROSCOPIC
Bilirubin Urine: NEGATIVE
Glucose, UA: NEGATIVE mg/dL
Ketones, ur: NEGATIVE mg/dL
Nitrite: NEGATIVE
Protein, ur: 30 mg/dL — AB
RBC / HPF: >50 RBC/hpf (ref 0–5)
Specific Gravity, Urine: 1.018 (ref 1.005–1.030)
WBC, UA: >50 WBC/hpf (ref 0–5)
pH: 5 (ref 5.0–8.0)

## 2022-08-21 LAB — ETHANOL: Alcohol, Ethyl (B): <10 mg/dL (ref <10)

## 2022-08-21 LAB — CBG MONITORING, ED: Glucose-Capillary: 138 mg/dL — ABNORMAL HIGH (ref 70–99)

## 2022-08-21 LAB — GLUCOSE, CAPILLARY
Glucose-Capillary: 124 mg/dL — ABNORMAL HIGH (ref 70–99)
Glucose-Capillary: 93 mg/dL (ref 70–99)

## 2022-08-21 MED ORDER — ASPIRIN 81 MG PO TBEC
81.0000 mg | DELAYED_RELEASE_TABLET | Freq: Every day | ORAL | Status: DC
  Administered 2022-08-22 – 2022-08-24 (×3): 81 mg via ORAL
  Filled 2022-08-21 (×3): qty 1

## 2022-08-21 MED ORDER — MORPHINE SULFATE (PF) 2 MG/ML IV SOLN
2.0000 mg | INTRAVENOUS | Status: DC | PRN
  Administered 2022-08-21 – 2022-08-22 (×2): 2 mg via INTRAVENOUS
  Filled 2022-08-21 (×2): qty 1

## 2022-08-21 MED ORDER — INSULIN ASPART 100 UNIT/ML IJ SOLN
0.0000 [IU] | Freq: Three times a day (TID) | INTRAMUSCULAR | Status: DC
  Administered 2022-08-21 – 2022-08-23 (×4): 1 [IU] via SUBCUTANEOUS
  Administered 2022-08-23 – 2022-08-24 (×2): 2 [IU] via SUBCUTANEOUS

## 2022-08-21 MED ORDER — BRIMONIDINE TARTRATE 0.2 % OP SOLN
1.0000 [drp] | Freq: Three times a day (TID) | OPHTHALMIC | Status: DC
  Administered 2022-08-22 – 2022-08-24 (×8): 1 [drp] via OPHTHALMIC
  Filled 2022-08-21: qty 5

## 2022-08-21 MED ORDER — ACETAMINOPHEN 325 MG PO TABS: 650.0000 mg | ORAL_TABLET | Freq: Four times a day (QID) | ORAL | Status: DC | PRN

## 2022-08-21 MED ORDER — MEMANTINE HCL 10 MG PO TABS
10.0000 mg | ORAL_TABLET | Freq: Two times a day (BID) | ORAL | Status: DC
  Administered 2022-08-22 – 2022-08-24 (×5): 10 mg via ORAL
  Filled 2022-08-21 (×5): qty 1

## 2022-08-21 MED ORDER — SODIUM CHLORIDE 0.9 % IV SOLN
2.0000 g | Freq: Every day | INTRAVENOUS | Status: DC
  Administered 2022-08-21 – 2022-08-24 (×4): 2 g via INTRAVENOUS
  Filled 2022-08-21 (×4): qty 20

## 2022-08-21 MED ORDER — IRBESARTAN 75 MG PO TABS
150.0000 mg | ORAL_TABLET | Freq: Every day | ORAL | Status: DC
  Administered 2022-08-22 – 2022-08-24 (×3): 150 mg via ORAL
  Filled 2022-08-21 (×3): qty 2

## 2022-08-21 MED ORDER — ACETAMINOPHEN 650 MG RE SUPP
650.0000 mg | Freq: Four times a day (QID) | RECTAL | Status: DC | PRN
  Administered 2022-08-21: 650 mg via RECTAL
  Filled 2022-08-21: qty 1

## 2022-08-21 MED ORDER — SODIUM CHLORIDE 0.9% FLUSH
3.0000 mL | Freq: Two times a day (BID) | INTRAVENOUS | Status: DC
  Administered 2022-08-21 – 2022-08-24 (×6): 3 mL via INTRAVENOUS

## 2022-08-21 MED ORDER — ATORVASTATIN CALCIUM 10 MG PO TABS
20.0000 mg | ORAL_TABLET | Freq: Every evening | ORAL | Status: DC
  Administered 2022-08-22 – 2022-08-23 (×2): 20 mg via ORAL
  Filled 2022-08-21 (×2): qty 2

## 2022-08-21 MED ORDER — ENOXAPARIN SODIUM 40 MG/0.4ML IJ SOSY
40.0000 mg | PREFILLED_SYRINGE | Freq: Every day | INTRAMUSCULAR | Status: DC
  Administered 2022-08-21 – 2022-08-24 (×4): 40 mg via SUBCUTANEOUS
  Filled 2022-08-21 (×4): qty 0.4

## 2022-08-21 MED ORDER — DORZOLAMIDE HCL-TIMOLOL MAL 2-0.5 % OP SOLN
1.0000 [drp] | Freq: Two times a day (BID) | OPHTHALMIC | Status: DC
  Administered 2022-08-22 – 2022-08-24 (×6): 1 [drp] via OPHTHALMIC
  Filled 2022-08-21: qty 10

## 2022-08-21 MED ORDER — CIPROFLOXACIN IN D5W 400 MG/200ML IV SOLN: 400.0000 mg | INTRAVENOUS | Status: DC

## 2022-08-21 MED ORDER — LEVOTHYROXINE SODIUM 88 MCG PO TABS
88.0000 ug | ORAL_TABLET | Freq: Every day | ORAL | Status: DC
  Administered 2022-08-23 – 2022-08-24 (×2): 88 ug via ORAL
  Filled 2022-08-21 (×3): qty 1

## 2022-08-21 MED ORDER — POLYETHYLENE GLYCOL 3350 17 G PO PACK
17.0000 g | PACK | Freq: Every day | ORAL | Status: DC | PRN
  Administered 2022-08-23: 17 g via ORAL
  Filled 2022-08-21: qty 1

## 2022-08-21 MED ORDER — TAMSULOSIN HCL 0.4 MG PO CAPS
0.4000 mg | ORAL_CAPSULE | Freq: Two times a day (BID) | ORAL | Status: DC
  Administered 2022-08-22 – 2022-08-24 (×5): 0.4 mg via ORAL
  Filled 2022-08-21 (×5): qty 1

## 2022-08-21 MED ORDER — SODIUM CHLORIDE 0.9 % IV SOLN: INTRAVENOUS | Status: AC

## 2022-08-21 MED ORDER — PANTOPRAZOLE SODIUM 40 MG PO TBEC
40.0000 mg | DELAYED_RELEASE_TABLET | Freq: Every day | ORAL | Status: DC
  Administered 2022-08-22 – 2022-08-24 (×3): 40 mg via ORAL
  Filled 2022-08-21 (×3): qty 1

## 2022-08-21 NOTE — ED Notes (Signed)
Urine sent to lab wife at bedside pt resting on stretcher VSS will continue to monitor

## 2022-08-21 NOTE — ED Provider Notes (Signed)
[facility] Provider Note   CSN: [id number] Arrival date & time: 08/21/22  [id number]     History  Chief Complaint  Patient presents with   Altered Mental Status    Mike Mack is a 87 y.o. male.  87 year old male with a history of dementia, polio with right lower extremity weakness, diabetes, hypertension and UTI who presents emergency department with altered mental status.  Patient was in his usual state of health until this morning when his wife went to awaken him and he was less responsive than usual.  She says that his mental status has waxed and waned throughout the day.  He is unable to give additional history aside from stating that he has had some dysuria recently.  No fevers or flank pain. Did have episode of brown emesis today.  But denies any nausea or vomiting otherwise.  No diarrhea flank pain, fevers.  Based on chart review has grown E. coli and Pseudomonas on prior urine cultures.  Wife denies any new medication changes aside from discontinuing his amlodipine.  Denies new numbness or weakness, slurred speech or facial droop.  Lives at home and does not currently give additional nursing care.  Is wheelchair-bound due to his polio.       Home Medications Prior to Admission medications   Medication Sig Start Date End Date Taking? Authorizing Provider  acetaminophen (TYLENOL) 325 MG tablet Take 2 tablets (650 mg total) by mouth every 6 (six) hours as needed for mild pain (or Fever >/= 101). 11/27/20  Yes Debbe Odea, MD  amLODipine (NORVASC) 2.5 MG tablet Take 1 tablet (2.5 mg total) by mouth daily. 08/11/22  Yes Little Ishikawa, MD  aspirin EC 81 MG tablet Take 1 tablet (81 mg total) by mouth daily. Swallow whole. 04/06/22  Yes Little Ishikawa, MD  atorvastatin (LIPITOR) 20 MG tablet Take 20 mg by mouth every evening.  11/03/13  Yes [provider]  B Complex Vitamins (B COMPLEX PO) Take 1 tablet by mouth at  bedtime.   Yes [provider]  brimonidine (ALPHAGAN) 0.2 % ophthalmic solution Place 1 drop into both eyes 3 (three) times daily.   Yes [provider]  cholecalciferol (VITAMIN D3) 10 MCG (400 UNIT) TABS tablet Take 400 Units by mouth daily.   Yes [provider]  dorzolamide-timolol (COSOPT) 22.3-6.8 MG/ML ophthalmic solution Place 1 drop into both eyes 2 (two) times daily. 03/04/20  Yes [provider]  Finerenone 10 MG TABS Take 1 tablet by mouth daily.   Yes [provider]  latanoprost (XALATAN) 0.005 % ophthalmic solution Place 1 drop into both eyes at bedtime.   Yes [provider]  levothyroxine (SYNTHROID) 88 MCG tablet Take 88 mcg by mouth daily before breakfast.   Yes [provider]  memantine (NAMENDA) 10 MG tablet Take 10 mg by mouth in the morning and at bedtime. 11/04/20  Yes [provider]  mirtazapine (REMERON SOL-TAB) 15 MG disintegrating tablet Take 1 tablet (15 mg total) by mouth at bedtime. 03/09/22  Yes Vann, Jessica U, DO  olmesartan (BENICAR) 20 MG tablet Take 20 mg by mouth daily.   Yes [provider]  pantoprazole (PROTONIX) 40 MG tablet Take 1 tablet (40 mg total) by mouth daily. 07/08/22  Yes Dion Saucier A, PA  polyethylene glycol (MIRALAX / GLYCOLAX) packet Take 17 g by mouth daily. Patient taking differently: Take 17 g by mouth daily as needed for mild constipation.  09/06/13  Yes [name], MD  RHOPRESSA 0.02 % SOLN Place 1 drop into both eyes at bedtime. 03/05/20  Yes [provider]  Tamsulosin HCl (FLOMAX) 0.4 MG CAPS Take 0.4 mg by mouth in the morning and at bedtime.   Yes [provider]  docusate sodium (COLACE) 100 MG capsule Take 1 capsule (100 mg total) by mouth 2 (two) times daily as needed for mild constipation. Patient not taking: Reported on 08/08/2022 03/09/22   Geradine Girt, DO  feeding supplement, GLUCERNA SHAKE, (GLUCERNA SHAKE) LIQD Take  237 mLs by mouth 3 (three) times daily between meals. Patient not taking: Reported on 08/08/2022 03/25/20   Antonieta Pert, MD      Allergies    Metformin and related and Soliqua [insulin glargine-lixisenatide]    Review of Systems   Review of Systems  Physical Exam Updated Vital Signs BP 107/64 (BP Location: Left Arm)   Pulse 67   Temp (!) 97.4 F (36.3 C) (Oral)   Resp 12   Ht '5\' 6"'$  (1.676 m)   Wt 70.3 kg   SpO2 98%   BMI 25.02 kg/m  Physical Exam Vitals and nursing note reviewed.  Constitutional:      General: He is not in acute distress.    Appearance: He is well-developed.     Comments: Drowsy but easily arousable.  Oriented to self and the fact that he was in the hospital.  Per wife this is sometimes usual for him  HENT:     Head: Normocephalic and atraumatic.     Right Ear: External ear normal.     Left Ear: External ear normal.     Nose: Nose normal.  Eyes:     Extraocular Movements: Extraocular movements intact.     Conjunctiva/sclera: Conjunctivae normal.     Pupils: Pupils are equal, round, and reactive to light.  Cardiovascular:     Rate and Rhythm: Normal rate and regular rhythm.     Heart sounds: Normal heart sounds.  Pulmonary:     Effort: Pulmonary effort is normal. No respiratory distress.     Breath sounds: Normal breath sounds.  Abdominal:     General: There is no distension.     Palpations: Abdomen is soft. There is no mass.     Tenderness: There is no abdominal tenderness. There is no guarding.  Musculoskeletal:     Cervical back: Normal range of motion and neck supple.     Right lower leg: No edema.     Left lower leg: No edema.  Skin:    General: Skin is warm and dry.  Neurological:     Mental Status: Mental status is at baseline.     Comments: Cranial nerves II through XII grossly intact.  Right lower extremity weakness and atrophy from polio.  Globally weak elsewhere without any focal neurologic deficits.  Intact sensation to light touch in  all 4 extremities.  Psychiatric:        Mood and Affect: Mood normal.        Behavior: Behavior normal.     ED Results / Procedures / Treatments   Labs (all labs ordered are listed, but only abnormal results are displayed) Labs Reviewed  CBC WITH DIFFERENTIAL/PLATELET - Abnormal; Notable for the following components:      Result Value   RDW 17.8 (*)    All other components within normal limits  URINALYSIS, ROUTINE W REFLEX MICROSCOPIC - Abnormal; Notable for the following components:  APPearance HAZY (*)    Hgb urine dipstick LARGE (*)    Protein, ur 30 (*)    Leukocytes,Ua LARGE (*)    Bacteria, UA RARE (*)    All other components within normal limits  COMPREHENSIVE METABOLIC PANEL - Abnormal; Notable for the following components:   Sodium 133 (*)    Glucose, Bld 156 (*)    Albumin 3.2 (*)    GFR, Estimated 58 (*)    All other components within normal limits  GLUCOSE, CAPILLARY - Abnormal; Notable for the following components:   Glucose-Capillary 124 (*)    All other components within normal limits  CBG MONITORING, ED - Abnormal; Notable for the following components:   Glucose-Capillary 138 (*)    All other components within normal limits  URINE CULTURE  ETHANOL  RAPID URINE DRUG SCREEN, HOSP PERFORMED  COMPREHENSIVE METABOLIC PANEL  CBC    EKG EKG Interpretation  Date/Time:  Monday August 21 2022 08:43:16 EST Ventricular Rate:  68 PR Interval:  198 QRS Duration: 107 QT Interval:  411 QTC Calculation: 438 R Axis:   36 Text Interpretation: Sinus rhythm Anteroseptal infarct, old Confirmed by Margaretmary Eddy [phone]) on 08/21/2022 9:12:57 AM  Radiology CT HEAD WO CONTRAST  Result Date: 08/21/2022 CLINICAL DATA:  Altered mental status.  Lethargy and brown emesis EXAM: CT HEAD WITHOUT CONTRAST TECHNIQUE: Contiguous axial images were obtained from the base of the skull through the vertex without intravenous contrast. RADIATION DOSE REDUCTION: This exam was performed  according to the departmental dose-optimization program which includes automated exposure control, adjustment of the mA and/or kV according to patient size and/or use of iterative reconstruction technique. COMPARISON:  08/08/2022 FINDINGS: Brain: No evidence of acute infarction, hemorrhage, hydrocephalus, extra-axial collection or mass lesion/mass effect. Chronic bilateral cerebellar and left deep internal capsule infarcts. Chronic small vessel ischemia with patchy low-density in the cerebral white matter. Generalized atrophy with ventriculomegaly. There is callosum angle narrowing and upward bulging of the corpus callosum, a degree of communicating hydrocephalus is possible. Vascular: No hyperdense vessel or unexpected calcification. Skull: Normal. Negative for fracture or focal lesion. Sinuses/Orbits: No acute finding. IMPRESSION: 1. No acute finding or change from 08/08/2022. 2. Chronic small vessel disease with chronic small vessel infarcts. 3. Ventriculomegaly with some features seen in normal pressure hydrocephalus. Electronically Signed   By: Jorje Guild M.D.   On: 08/21/2022 11:49   DG Chest Port 1 View  Result Date: 08/21/2022 CLINICAL DATA:  Cough. EXAM: PORTABLE CHEST 1 VIEW COMPARISON:  August 08, 2022. FINDINGS: The heart size and mediastinal contours are within normal limits. Minimal right basilar subsegmental atelectasis is noted. Minimal pleural effusions are noted. The visualized skeletal structures are unremarkable. IMPRESSION: Minimal right basilar subsegmental atelectasis. Minimal pleural effusions. Electronically Signed   By: Marijo Conception M.D.   On: 08/21/2022 09:38    Procedures Procedures   Medications Ordered in ED Medications  aspirin EC tablet 81 mg (has no administration in time range)  atorvastatin (LIPITOR) tablet 20 mg (20 mg Oral Not Given 08/21/22 1727)  irbesartan (AVAPRO) tablet 150 mg (has no administration in time range)  memantine (NAMENDA) tablet 10 mg (10 mg  Oral Not Given 08/21/22 1523)  levothyroxine (SYNTHROID) tablet 88 mcg (has no administration in time range)  pantoprazole (PROTONIX) EC tablet 40 mg (has no administration in time range)  enoxaparin (LOVENOX) injection 40 mg (40 mg Subcutaneous Given 08/21/22 1410)  sodium chloride flush (NS) 0.9 % injection 3 mL (3 mLs Intravenous  Given 08/21/22 1407)  acetaminophen (TYLENOL) tablet 650 mg (has no administration in time range)    Or  acetaminophen (TYLENOL) suppository 650 mg (has no administration in time range)  polyethylene glycol (MIRALAX / GLYCOLAX) packet 17 g (has no administration in time range)  tamsulosin (FLOMAX) capsule 0.4 mg (0.4 mg Oral Not Given 08/21/22 1524)  brimonidine (ALPHAGAN) 0.2 % ophthalmic solution 1 drop (1 drop Both Eyes Not Given 08/21/22 1727)  dorzolamide-timolol (COSOPT) 2-0.5 % ophthalmic solution 1 drop (1 drop Both Eyes Not Given 08/21/22 1727)  cefTRIAXone (ROCEPHIN) 2 g in sodium chloride 0.9 % 100 mL IVPB (0 g Intravenous Stopped 08/21/22 1445)  insulin aspart (novoLOG) injection 0-9 Units (has no administration in time range)  0.9 %  sodium chloride infusion ( Intravenous New Bag/Given 08/21/22 1644)    ED Course/ Medical Decision Making/ A&P                             Medical Decision Making Amount and/or Complexity of Data Reviewed Labs: ordered. Radiology: ordered.  Risk Prescription drug management. Decision regarding hospitalization.   Ethaniel Stuver is a 87 y.o. male with comorbidities that complicate the patient evaluation including dementia, polio with right lower extremity weakness who is wheelchair-bound, diabetes, hypertension, and UTIs who presents to the emergency department status and dysuria  Initial Ddx:  Urinary tract infection, ICH, pneumonia, dementia, delirium  MDM:  The patient may likely have delirium induced by urinary tract infection.  This is somewhat difficult to distinguish from his baseline mental status which is worsened due  to dementia but does appear to be more drowsy than usual.  Is on aspirin and with his age will obtain CT head to rule out ICH or other acute intracranial abnormality.  Will obtain chest x-ray to evaluate for possible pneumonia which could also be a cause of his altered mental status though feel this is less likely since he is not having symptoms.  Plan:  Labs Urinalysis CT head Chest x-ray  ED Summary/Re-evaluation:  Patient underwent the above workup.  CT head showed possible normal pressure hydrocephalus that is seen on prior CT scans.  Given his acute change feel that this is less likely the cause of his symptoms.  Did have a urinalysis that was consistent with urinary tract infection.  Patient was started on ceftriaxone and was admitted to medicine for further management.  This patient presents to the ED for concern of complaints listed in HPI, this involves an extensive number of treatment options, and is a complaint that carries with it a high risk of complications and morbidity. Disposition including potential need for admission considered.   Dispo: Admit to Floor  Additional history obtained from spouse Records reviewed DC Summary The following labs were independently interpreted: Urinalysis and show urinary tract infection I independently reviewed the following imaging with scope of interpretation limited to determining acute life threatening conditions related to emergency care: CT Head and agree with the radiologist interpretation with the following exceptions: None I personally reviewed and interpreted cardiac monitoring: normal sinus rhythm  I personally reviewed and interpreted the pt's EKG: see above for interpretation  I have reviewed the patients home medications and made adjustments as needed Consults: Hospitalist Social Determinants of health:  Elderly   Final Clinical Impression(s) / ED Diagnoses Final diagnoses:  Acute cystitis with hematuria  Somnolence    Rx /  DC Orders ED Discharge Orders  Ordered    Do not attempt resuscitation (DNR)  Status:  Canceled        08/21/22 1229              Fransico Meadow, MD 08/21/22 1745

## 2022-08-21 NOTE — ED Triage Notes (Addendum)
PT from home- DNR. Wife takes care of him and she complains of lethargy and brown emesis next to him and on him in middle of the night. Pt responds to verbal stimulation with incomprehensible sounds. Eyes closed.

## 2022-08-21 NOTE — ED Notes (Signed)
Help get patient undressed into a gown on the monitor patient is resting with call bell in reach

## 2022-08-21 NOTE — ED Notes (Signed)
Radiology at bedside

## 2022-08-21 NOTE — ED Notes (Signed)
Patient transported to CT 

## 2022-08-21 NOTE — H&P (Signed)
History and Physical   Mike Mack [id number] DOB: 1936-02-27 DOA: 08/21/2022  PCP: [name], MD   Patient coming from: Home  Chief Complaint: AMS  HPI: Mike Mack is a 87 y.o. male with medical history significant of diabetes, stroke, dementia, polio, CKD 3A, BPH, anemia, glaucoma presenting with altered mental status.  History obtained with assistance of chart review and family due to patient's baseline dementia and altered mental status.  Patient has known history of dementia at baseline as above and at baseline also has lower extremity weakness right greater than left and is largely bedbound/wheelchair-bound.  Wife reports that since his discharge last month he was at baseline until this morning.  She found him more difficult to awaken and he was less responsive.  Had intermittent decreases in responsiveness throughout the day.  He reports some dysuria.  He had an episode of brown emesis today but otherwise denies nausea or vomiting.  Does have history of UTI and altered mental status most recently last month and was admitted for this with improvement after treatment with antibiotics.  Unable to obtain full review of systems due to patient's altered mentation and baseline dementia, but no specific complaints at this time.  ED Course: Vital signs in the ED significant for blood pressure in the [id number] to [id number] systolic.  Lab workup included CMP with sodium 133, glucose 156, albumin 3.2.  CBC within normal limits.  UDS pending.  Urinalysis showed hemoglobin, protein, leukocytes, rare bacteria.  Chest x-ray showed minimal right basilar atelectasis and minimal pleural effusion.  CT head showed chronic changes that are stable as well as ventriculomegaly with some features that could be seen in normal pressure hydrocephalus however the ventriculomegaly is not new.  Patient received IV Cipro in the ED.  Review of Systems: Unable to obtain full review of systems due to patient's altered  mentation and baseline dementia.  Past Medical History:  Diagnosis Date   Acute metabolic encephalopathy [id number]   Anemia 05/13/2013   Arthritis    "right leg" (07/25/2013)   BPH (benign prostatic hyperplasia)    Cholecystitis, acute 05/24/2013   Constipation 05/13/2013   Exertional shortness of breath    "sometimes" (07/25/2013)   History of kidney stones    History of stomach ulcers    Hypertension    Hypothermia 04/01/2022   Neuropathy    Polio    Polio Childhood   Small bowel obstruction ([place])    Stroke ([place]) 2014   residual:  "left hand kind of numb" (07/25/2013)   Type II diabetes mellitus ([place])     Past Surgical History:  Procedure Laterality Date   BIOPSY  11/30/2020   Procedure: BIOPSY;  Surgeon: Ronnette Juniper, MD;  Location: [place];  Service: Gastroenterology;;   BIOPSY  03/03/2022   Procedure: BIOPSY;  Surgeon: Clarene Essex, MD;  Location: [facility];  Service: Gastroenterology;;   BOWEL RESECTION     CATARACT EXTRACTION W/ INTRAOCULAR LENS  IMPLANT, BILATERAL Bilateral 2000's   CHOLECYSTECTOMY N/A 07/25/2013   Procedure: LAPAROSCOPIC CHOLECYSTECTOMY WITH INTRAOPERATIVE CHOLANGIOGRAM;  Surgeon: [name], MD;  Location: [place];  Service: General;  Laterality: N/A;   COLON SURGERY     COLONOSCOPY     Hx: of   CYSTOSCOPY WITH BIOPSY N/A 12/01/2019   Procedure: CYSTOSCOPY WITH BLADDER BIOPSY AND FULGURATION;  Surgeon: Lucas Mallow, MD;  Location: [facility];  Service: Urology;  Laterality: N/A;   ESOPHAGOGASTRODUODENOSCOPY N/A 11/30/2020   Procedure: ESOPHAGOGASTRODUODENOSCOPY (EGD);  Surgeon: Therisa Doyne,  Megan Salon, MD;  Location: [place];  Service: Gastroenterology;  Laterality: N/A;   ESOPHAGOGASTRODUODENOSCOPY N/A 03/03/2022   Procedure: ESOPHAGOGASTRODUODENOSCOPY (EGD);  Surgeon: Clarene Essex, MD;  Location: [facility];  Service: Gastroenterology;  Laterality: N/A;   EXCISIONAL HEMORRHOIDECTOMY  2000's   EXTRACORPOREAL SHOCK WAVE LITHOTRIPSY Right 10/03/2018    Procedure: EXTRACORPOREAL SHOCK WAVE LITHOTRIPSY (ESWL);  Surgeon: Festus Aloe, MD;  Location: [facility];  Service: Urology;  Laterality: Right;   EYE SURGERY     INGUINAL HERNIA REPAIR Right 1970's   IR RADIOLOGIST EVAL & MGMT  04/06/2020   LAPAROSCOPIC CHOLECYSTECTOMY  07/25/2013   w/LOA (07/25/2013)    Social History  reports that he has never smoked. He has never used smokeless tobacco. He reports that he does not drink alcohol and does not use drugs.  Allergies  Allergen Reactions   Metformin And Related Other (See Comments)    Gi intolerance   [name] [Insulin Glargine-Lixisenatide] Other (See Comments)    Stomach cramps    Family History  Problem Relation Age of Onset   Heart failure Mother    Hypertension Sister    Diabetes Sister    Diabetes Brother    Hypertension Brother   Reviewed on admission  Prior to Admission medications   Medication Sig Start Date End Date Taking? Authorizing Provider  acetaminophen (TYLENOL) 325 MG tablet Take 2 tablets (650 mg total) by mouth every 6 (six) hours as needed for mild pain (or Fever >/= 101). 11/27/20   Debbe Odea, MD  amLODipine (NORVASC) 2.5 MG tablet Take 1 tablet (2.5 mg total) by mouth daily. 08/11/22   Little Ishikawa, MD  aspirin EC 81 MG tablet Take 1 tablet (81 mg total) by mouth daily. Swallow whole. 04/06/22   Little Ishikawa, MD  atorvastatin (LIPITOR) 20 MG tablet Take 20 mg by mouth every evening.  11/03/13   [provider]  B Complex Vitamins (B COMPLEX PO) Take 1 tablet by mouth at bedtime.    [provider]  brimonidine (ALPHAGAN) 0.2 % ophthalmic solution Place 1 drop into both eyes 3 (three) times daily.    [provider]  cholecalciferol (VITAMIN D3) 10 MCG (400 UNIT) TABS tablet Take 400 Units by mouth daily.    [provider]  docusate sodium (COLACE) 100 MG capsule Take 1 capsule (100 mg total) by mouth 2 (two) times daily as needed for mild  constipation. Patient not taking: Reported on 08/08/2022 03/09/22   Geradine Girt, DO  dorzolamide-timolol (COSOPT) 22.3-6.8 MG/ML ophthalmic solution Place 1 drop into both eyes 2 (two) times daily. 03/04/20   [provider]  feeding supplement, GLUCERNA SHAKE, (GLUCERNA SHAKE) LIQD Take 237 mLs by mouth 3 (three) times daily between meals. Patient not taking: Reported on 08/08/2022 03/25/20   Antonieta Pert, MD  Finerenone 10 MG TABS Take 1 tablet by mouth daily.    [provider]  levothyroxine (SYNTHROID) 88 MCG tablet Take 88 mcg by mouth daily before breakfast.    [provider]  memantine (NAMENDA) 10 MG tablet Take 10 mg by mouth in the morning and at bedtime. 11/04/20   [provider]  mirtazapine (REMERON SOL-TAB) 15 MG disintegrating tablet Take 1 tablet (15 mg total) by mouth at bedtime. 03/09/22   Geradine Girt, DO  olmesartan (BENICAR) 20 MG tablet Take 20 mg by mouth daily.    [provider]  pantoprazole (PROTONIX) 40 MG tablet Take 1 tablet (40 mg total) by mouth daily.  07/08/22   Dion Saucier A, PA  polyethylene glycol (MIRALAX / GLYCOLAX) packet Take 17 g by mouth daily. Patient taking differently: Take 17 g by mouth daily as needed for mild constipation. 09/06/13   [name], MD  RHOPRESSA 0.02 % SOLN Place 1 drop into both eyes at bedtime. 03/05/20   [provider]  Tamsulosin HCl (FLOMAX) 0.4 MG CAPS Take 0.4 mg by mouth in the morning and at bedtime.    [provider]    Physical Exam: Vitals:   08/21/22 1115 08/21/22 1145 08/21/22 1200 08/21/22 1300  BP:      Pulse:   63   Resp: '18 11 10   '$ Temp:    98 F (36.7 C)  TempSrc:    Oral  SpO2:   99%   Weight:      Height:        Physical Exam Constitutional:      General: He is not in acute distress.    Appearance: Normal appearance.  HENT:     Head: Normocephalic and atraumatic.     Mouth/Throat:     Mouth: Mucous membranes are moist.      Pharynx: Oropharynx is clear.  Eyes:     Extraocular Movements: Extraocular movements intact.     Pupils: Pupils are equal, round, and reactive to light.  Cardiovascular:     Rate and Rhythm: Normal rate and regular rhythm.     Pulses: Normal pulses.     Heart sounds: Normal heart sounds.  Pulmonary:     Effort: Pulmonary effort is normal. No respiratory distress.     Breath sounds: Normal breath sounds.  Abdominal:     General: Bowel sounds are normal. There is no distension.     Palpations: Abdomen is soft.     Tenderness: There is no abdominal tenderness.  Musculoskeletal:        General: No swelling or deformity.  Skin:    General: Skin is warm and dry.  Neurological:     General: No focal deficit present.     Comments: Drowsy but arousable to voice    Labs on Admission: I have personally reviewed following labs and imaging studies  CBC: Recent Labs  Lab 08/21/22 0850  WBC 10.1  NEUTROABS 7.1  HGB 13.3  HCT 40.6  MCV 92.5  PLT 123XX123    Basic Metabolic Panel: Recent Labs  Lab 08/21/22 1100  NA 133*  K 4.4  CL 104  CO2 24  GLUCOSE 156*  BUN 17  CREATININE 1.23  CALCIUM 9.1    GFR: Estimated Creatinine Clearance: 40.3 mL/min (by C-G formula based on SCr of 1.23 mg/dL).  Liver Function Tests: Recent Labs  Lab 08/21/22 1100  AST 15  ALT 11  ALKPHOS 111  BILITOT 0.7  PROT 6.6  ALBUMIN 3.2*    Urine analysis:    Component Value Date/Time   COLORURINE YELLOW 08/21/2022 1028   APPEARANCEUR HAZY (A) 08/21/2022 1028   LABSPEC 1.018 08/21/2022 1028   PHURINE 5.0 08/21/2022 1028   GLUCOSEU NEGATIVE 08/21/2022 1028   HGBUR LARGE (A) 08/21/2022 1028   BILIRUBINUR NEGATIVE 08/21/2022 1028   KETONESUR NEGATIVE 08/21/2022 1028   PROTEINUR 30 (A) 08/21/2022 1028   UROBILINOGEN 0.2 10/25/2014 1520   NITRITE NEGATIVE 08/21/2022 1028   LEUKOCYTESUR LARGE (A) 08/21/2022 1028    Radiological Exams on Admission: CT HEAD WO CONTRAST  Result Date:  08/21/2022 CLINICAL DATA:  Altered mental status.  Lethargy and brown  emesis EXAM: CT HEAD WITHOUT CONTRAST TECHNIQUE: Contiguous axial images were obtained from the base of the skull through the vertex without intravenous contrast. RADIATION DOSE REDUCTION: This exam was performed according to the departmental dose-optimization program which includes automated exposure control, adjustment of the mA and/or kV according to patient size and/or use of iterative reconstruction technique. COMPARISON:  08/08/2022 FINDINGS: Brain: No evidence of acute infarction, hemorrhage, hydrocephalus, extra-axial collection or mass lesion/mass effect. Chronic bilateral cerebellar and left deep internal capsule infarcts. Chronic small vessel ischemia with patchy low-density in the cerebral white matter. Generalized atrophy with ventriculomegaly. There is callosum angle narrowing and upward bulging of the corpus callosum, a degree of communicating hydrocephalus is possible. Vascular: No hyperdense vessel or unexpected calcification. Skull: Normal. Negative for fracture or focal lesion. Sinuses/Orbits: No acute finding. IMPRESSION: 1. No acute finding or change from 08/08/2022. 2. Chronic small vessel disease with chronic small vessel infarcts. 3. Ventriculomegaly with some features seen in normal pressure hydrocephalus. Electronically Signed   By: Jorje Guild M.D.   On: 08/21/2022 11:49   DG Chest Port 1 View  Result Date: 08/21/2022 CLINICAL DATA:  Cough. EXAM: PORTABLE CHEST 1 VIEW COMPARISON:  August 08, 2022. FINDINGS: The heart size and mediastinal contours are within normal limits. Minimal right basilar subsegmental atelectasis is noted. Minimal pleural effusions are noted. The visualized skeletal structures are unremarkable. IMPRESSION: Minimal right basilar subsegmental atelectasis. Minimal pleural effusions. Electronically Signed   By: Marijo Conception M.D.   On: 08/21/2022 09:38    EKG: Independently reviewed.  Sinus  rhythm at 60 bpm.  Nonspecific T wave flattening multiple leads.  Minimal baseline artifact.  Assessment/Plan Active Problems:   CKD (chronic kidney disease) stage 3, GFR 30-59 ml/min (HCC)   Hypertension   BPH (benign prostatic hyperplasia)   Anemia   H/O: CVA (cerebrovascular accident)   H/O poliomyelitis   Type 2 diabetes mellitus (HCC)   Dementia (HCC)   Glaucoma   Acute encephalopathy   Acute encephalopathy Suspected UTI > Patient presenting with altered mentation starting this morning with decreased responsiveness and intermittent altered mentation throughout the day.  Episode of brown emesis and is reporting dysuria. > Has history of prior encephalopathy with UTI including last month and treated with similar presentation. > Somewhat abnormal CT head with ventriculomegaly which is not new however this CT notes there is some changes that could be consistent with NPH.  Will treat for UTI and consider further investigation if patient does not respond appropriately. > Received IV ciprofloxacin in the ED. - Monitor on telemetry - Trend fever curve and WBC - Check urine culture - Follow-up UDS and alcohol level - Switch antibiotics to ceftriaxone  History of stroke - Continue home atorvastatin and aspirin  Hypertension - Replace home olmesartan with formulary irbesartan  CKD 3A > Creatinine stable in the ED - Trend renal function and electrolytes  BPH - Continue home tamsulosin  Diabetes - SSI  Glaucoma - Continue home eyedrops  History of polio - Noted  DVT prophylaxis: Lovenox Code Status:   DNR, confirmed at bedside Family Communication:  Updated at bedside Disposition Plan:   Patient is from:  Home  Anticipated DC to:  Home  Anticipated DC date:  1 to 2 days  Anticipated DC barriers: None  Consults called:  None Admission status:  Observation, telemetry  Severity of Illness: The appropriate patient status for this patient is OBSERVATION. Observation  status is judged to be reasonable and necessary in order  to provide the required intensity of service to ensure the patient's safety. The patient's presenting symptoms, physical exam findings, and initial radiographic and laboratory data in the context of their medical condition is felt to place them at decreased risk for further clinical deterioration. Furthermore, it is anticipated that the patient will be medically stable for discharge from the hospital within 2 midnights of admission.    Marcelyn Bruins MD [facility]  How to contact the [facility] Attending or Consulting provider [place] or covering provider during after hours [place], for this patient?   Check the care team in [facility] and look for a) attending/consulting [facility] provider listed and b) the [facility] team listed Log into [email or web address] and use [place]'s universal password to access. If you do not have the password, please contact the hospital operator. Locate the [facility] provider you are looking for under [facility] and page to a number that you can be directly reached. If you still have difficulty reaching the provider, please page the [phone] (Director on Call) for the Hospitalists listed on amion for assistance.  08/21/2022, 1:01 PM

## 2022-08-21 NOTE — ED Notes (Signed)
ED TO INPATIENT HANDOFF REPORT  ED Nurse Name and Phone #: Waynetta Sandy [phone]  S Name/Age/Gender Mike Mack 87 y.o. male Room/Bed: 046C/046C  Code Status   Code Status: DNR  Home/SNF/Other Home Patient oriented to: self Is this baseline? No   Triage Complete: Triage complete  Chief Complaint Acute encephalopathy [G93.40]  Triage Note PT from home- DNR. Wife takes care of him and she complains of lethargy and brown emesis next to him and on him in middle of the night. Pt responds to verbal stimulation with incomprehensible sounds. Eyes closed.    Allergies Allergies  Allergen Reactions   Metformin And Related Other (See Comments)    Gi intolerance   [name] [Insulin Glargine-Lixisenatide] Other (See Comments)    Stomach cramps    Level of Care/Admitting Diagnosis ED Disposition     ED Disposition  Admit   Condition  --   [address]: [place] [[id number]]  Level of Care: Telemetry Medical [104]  May place patient in observation at [facility] or [place] if equivalent level of care is available:: No  Covid Evaluation: Asymptomatic - no recent exposure (last 10 days) testing not required  Diagnosis: Acute encephalopathy [id number]  Admitting Physician: Marcelyn Bruins [id number]  Attending Physician: Marcelyn Bruins [id number]          B Medical/Surgery History Past Medical History:  Diagnosis Date   Acute metabolic encephalopathy [id number]   Anemia 05/13/2013   Arthritis    "right leg" (07/25/2013)   BPH (benign prostatic hyperplasia)    Cholecystitis, acute 05/24/2013   Constipation 05/13/2013   Exertional shortness of breath    "sometimes" (07/25/2013)   History of kidney stones    History of stomach ulcers    Hypertension    Hypothermia 04/01/2022   Neuropathy    Polio    Polio Childhood   Small bowel obstruction ([place])    Stroke ([place]) 2014   residual:  "left hand kind of numb" (07/25/2013)   Type II  diabetes mellitus ([place])    Past Surgical History:  Procedure Laterality Date   BIOPSY  11/30/2020   Procedure: BIOPSY;  Surgeon: Ronnette Juniper, MD;  Location: [place];  Service: Gastroenterology;;   BIOPSY  03/03/2022   Procedure: BIOPSY;  Surgeon: Clarene Essex, MD;  Location: [facility];  Service: Gastroenterology;;   BOWEL RESECTION     CATARACT EXTRACTION W/ INTRAOCULAR LENS  IMPLANT, BILATERAL Bilateral 2000's   CHOLECYSTECTOMY N/A 07/25/2013   Procedure: LAPAROSCOPIC CHOLECYSTECTOMY WITH INTRAOPERATIVE CHOLANGIOGRAM;  Surgeon: [name], MD;  Location: [place];  Service: General;  Laterality: N/A;   COLON SURGERY     COLONOSCOPY     Hx: of   CYSTOSCOPY WITH BIOPSY N/A 12/01/2019   Procedure: CYSTOSCOPY WITH BLADDER BIOPSY AND FULGURATION;  Surgeon: Lucas Mallow, MD;  Location: [facility];  Service: Urology;  Laterality: N/A;   ESOPHAGOGASTRODUODENOSCOPY N/A 11/30/2020   Procedure: ESOPHAGOGASTRODUODENOSCOPY (EGD);  Surgeon: Ronnette Juniper, MD;  Location: [place];  Service: Gastroenterology;  Laterality: N/A;   ESOPHAGOGASTRODUODENOSCOPY N/A 03/03/2022   Procedure: ESOPHAGOGASTRODUODENOSCOPY (EGD);  Surgeon: Clarene Essex, MD;  Location: [facility];  Service: Gastroenterology;  Laterality: N/A;   EXCISIONAL HEMORRHOIDECTOMY  2000's   EXTRACORPOREAL SHOCK WAVE LITHOTRIPSY Right 10/03/2018   Procedure: EXTRACORPOREAL SHOCK WAVE LITHOTRIPSY (ESWL);  Surgeon: Festus Aloe, MD;  Location: [facility];  Service: Urology;  Laterality: Right;   EYE SURGERY     INGUINAL HERNIA REPAIR Right 1970's  IR RADIOLOGIST EVAL & MGMT  04/06/2020   LAPAROSCOPIC CHOLECYSTECTOMY  07/25/2013   w/LOA (07/25/2013)     A IV Location/Drains/Wounds Patient Lines/Drains/Airways Status     Active Line/Drains/Airways     Name Placement date Placement time Site Days   Peripheral IV 08/21/22 22 G 1" Anterior;Proximal;Right Forearm 08/21/22  0901  Forearm  less than 1   Pressure Injury 03/09/22 Buttocks  Right Stage 3 -  Full thickness tissue loss. Subcutaneous fat may be visible but bone, tendon or muscle are NOT exposed. 03/09/22  0848  -- 165   Pressure Injury 08/08/22 Ankle Left;Lateral Unstageable - Full thickness tissue loss in which the base of the injury is covered by slough (yellow, tan, gray, green or brown) and/or eschar (tan, brown or black) in the wound bed. Unstagable pressure injury 08/08/22  1800  -- 13   Wound / Incision (Open or Dehisced) 03/03/22 Irritant Dermatitis (Moisture Associated Skin Damage) Scrotum Left;Right 03/03/22  1000  Scrotum  171            Intake/Output Last 24 hours No intake or output data in the 24 hours ending 08/21/22 1341  Labs/Imaging Results for orders placed or performed during the hospital encounter of 08/21/22 (from the past 48 hour(s))  CBC with Differential/Platelet     Status: Abnormal   Collection Time: 08/21/22  8:50 AM  Result Value Ref Range   WBC 10.1 4.0 - 10.5 K/uL   RBC 4.39 4.22 - 5.81 MIL/uL   Hemoglobin 13.3 13.0 - 17.0 g/dL   HCT 40.6 39.0 - 52.0 %   MCV 92.5 80.0 - 100.0 fL   MCH 30.3 26.0 - 34.0 pg   MCHC 32.8 30.0 - 36.0 g/dL   RDW 17.8 (H) 11.5 - 15.5 %   Platelets 237 150 - 400 K/uL    Comment: REPEATED TO VERIFY   nRBC 0.0 0.0 - 0.2 %   Neutrophils Relative % 71 %   Neutro Abs 7.1 1.7 - 7.7 K/uL   Lymphocytes Relative 24 %   Lymphs Abs 2.4 0.7 - 4.0 K/uL   Monocytes Relative 5 %   Monocytes Absolute 0.5 0.1 - 1.0 K/uL   Eosinophils Relative 0 %   Eosinophils Absolute 0.0 0.0 - 0.5 K/uL   Basophils Relative 0 %   Basophils Absolute 0.0 0.0 - 0.1 K/uL   Immature Granulocytes 0 %   Abs Immature Granulocytes 0.02 0.00 - 0.07 K/uL    Comment: Performed at [facility], [address], [place], [place] [address]  CBG monitoring, ED     Status: Abnormal   Collection Time: 08/21/22  8:59 AM  Result Value Ref Range   Glucose-Capillary 138 (H) 70 - 99 mg/dL    Comment: Glucose reference range applies only  to samples taken after fasting for at least 8 hours.   Comment 1 Notify RN    Comment 2 Document in Chart   Urinalysis, Routine w reflex microscopic -Urine, Catheterized     Status: Abnormal   Collection Time: 08/21/22 10:28 AM  Result Value Ref Range   Color, Urine YELLOW YELLOW   APPearance HAZY (A) CLEAR   Specific Gravity, Urine 1.018 1.005 - 1.030   pH 5.0 5.0 - 8.0   Glucose, UA NEGATIVE NEGATIVE mg/dL   Hgb urine dipstick LARGE (A) NEGATIVE   Bilirubin Urine NEGATIVE NEGATIVE   Ketones, ur NEGATIVE NEGATIVE mg/dL   Protein, ur 30 (A) NEGATIVE mg/dL   Nitrite NEGATIVE NEGATIVE  Leukocytes,Ua LARGE (A) NEGATIVE   RBC / HPF >50 0 - 5 RBC/hpf   WBC, UA >50 0 - 5 WBC/hpf   Bacteria, UA RARE (A) NONE SEEN   Squamous Epithelial / HPF 0-5 0 - 5 /HPF   Mucus PRESENT    Budding Yeast PRESENT     Comment: Performed at [facility], [place] [address] [place], [place] [address]  Comprehensive metabolic panel     Status: Abnormal   Collection Time: 08/21/22 11:00 AM  Result Value Ref Range   Sodium 133 (L) 135 - 145 mmol/L   Potassium 4.4 3.5 - 5.1 mmol/L   Chloride 104 98 - 111 mmol/L   CO2 24 22 - 32 mmol/L   Glucose, Bld 156 (H) 70 - 99 mg/dL    Comment: Glucose reference range applies only to samples taken after fasting for at least 8 hours.   BUN 17 8 - 23 mg/dL   Creatinine, Ser 1.23 0.61 - 1.24 mg/dL   Calcium 9.1 8.9 - 10.3 mg/dL   Total Protein 6.6 6.5 - 8.1 g/dL   Albumin 3.2 (L) 3.5 - 5.0 g/dL   AST 15 15 - 41 U/L   ALT 11 0 - 44 U/L   Alkaline Phosphatase 111 38 - 126 U/L   Total Bilirubin 0.7 0.3 - 1.2 mg/dL   GFR, Estimated 58 (L) >60 mL/min    Comment: (NOTE) Calculated using the CKD-EPI Creatinine Equation (2021)    Anion gap 5 5 - 15    Comment: Performed at [facility] [address], [place], [place] [address]   CT HEAD WO CONTRAST  Result Date: 08/21/2022 CLINICAL DATA:  Altered mental status.  Lethargy and brown emesis EXAM: CT HEAD WITHOUT  CONTRAST TECHNIQUE: Contiguous axial images were obtained from the base of the skull through the vertex without intravenous contrast. RADIATION DOSE REDUCTION: This exam was performed according to the departmental dose-optimization program which includes automated exposure control, adjustment of the mA and/or kV according to patient size and/or use of iterative reconstruction technique. COMPARISON:  08/08/2022 FINDINGS: Brain: No evidence of acute infarction, hemorrhage, hydrocephalus, extra-axial collection or mass lesion/mass effect. Chronic bilateral cerebellar and left deep internal capsule infarcts. Chronic small vessel ischemia with patchy low-density in the cerebral white matter. Generalized atrophy with ventriculomegaly. There is callosum angle narrowing and upward bulging of the corpus callosum, a degree of communicating hydrocephalus is possible. Vascular: No hyperdense vessel or unexpected calcification. Skull: Normal. Negative for fracture or focal lesion. Sinuses/Orbits: No acute finding. IMPRESSION: 1. No acute finding or change from 08/08/2022. 2. Chronic small vessel disease with chronic small vessel infarcts. 3. Ventriculomegaly with some features seen in normal pressure hydrocephalus. Electronically Signed   By: Jorje Guild M.D.   On: 08/21/2022 11:49   DG Chest Port 1 View  Result Date: 08/21/2022 CLINICAL DATA:  Cough. EXAM: PORTABLE CHEST 1 VIEW COMPARISON:  August 08, 2022. FINDINGS: The heart size and mediastinal contours are within normal limits. Minimal right basilar subsegmental atelectasis is noted. Minimal pleural effusions are noted. The visualized skeletal structures are unremarkable. IMPRESSION: Minimal right basilar subsegmental atelectasis. Minimal pleural effusions. Electronically Signed   By: Marijo Conception M.D.   On: 08/21/2022 09:38    Pending Labs Unresulted Labs (From admission, onward)     Start     Ordered   08/28/22 0500  Creatinine, serum  (enoxaparin  (LOVENOX)    CrCl >/= 30 ml/min)  Weekly,  R     Comments: while on enoxaparin therapy    08/21/22 1257   08/22/22 0500  Comprehensive metabolic panel  Tomorrow morning,   R        08/21/22 1257   08/22/22 0500  CBC  Tomorrow morning,   R        08/21/22 1257   08/21/22 1256  Urine Culture (for pregnant, neutropenic or urologic patients or patients with an indwelling urinary catheter)  (Urine Culture)  Add-on,   AD       Question:  Indication  Answer:  Altered mental status (if no other cause identified)   08/21/22 1257   08/21/22 0851  Ethanol  Once,   URGENT        08/21/22 0850   08/21/22 0851  Rapid urine drug screen (hospital performed)  Once,   STAT        08/21/22 0850            Vitals/Pain Today's Vitals   08/21/22 1115 08/21/22 1145 08/21/22 1200 08/21/22 1300  BP:      Pulse:   63   Resp: '18 11 10   '$ Temp:    98 F (36.7 C)  TempSrc:    Oral  SpO2:   99%   Weight:      Height:        Isolation Precautions No active isolations  Medications Medications  aspirin EC tablet 81 mg (has no administration in time range)  atorvastatin (LIPITOR) tablet 20 mg (has no administration in time range)  irbesartan (AVAPRO) tablet 150 mg (has no administration in time range)  memantine (NAMENDA) tablet 10 mg (has no administration in time range)  levothyroxine (SYNTHROID) tablet 88 mcg (has no administration in time range)  pantoprazole (PROTONIX) EC tablet 40 mg (has no administration in time range)  enoxaparin (LOVENOX) injection 40 mg (has no administration in time range)  sodium chloride flush (NS) 0.9 % injection 3 mL (has no administration in time range)  acetaminophen (TYLENOL) tablet 650 mg (has no administration in time range)    Or  acetaminophen (TYLENOL) suppository 650 mg (has no administration in time range)  polyethylene glycol (MIRALAX / GLYCOLAX) packet 17 g (has no administration in time range)  tamsulosin (FLOMAX) capsule 0.4 mg (has no administration  in time range)  brimonidine (ALPHAGAN) 0.2 % ophthalmic solution 1 drop (has no administration in time range)  dorzolamide-timolol (COSOPT) 2-0.5 % ophthalmic solution 1 drop (has no administration in time range)  cefTRIAXone (ROCEPHIN) 2 g in sodium chloride 0.9 % 100 mL IVPB (has no administration in time range)  insulin aspart (novoLOG) injection 0-9 Units (has no administration in time range)    Mobility non-ambulatory     Focused Assessments Neuro Assessment Handoff:  Swallow screen pass? No  Cardiac Rhythm: Normal sinus rhythm       Neuro Assessment: Exceptions to [facility] Neuro Checks:      Has TPA been given? No If patient is a Neuro Trauma and patient is going to OR before floor call report to [place] nurse: [phone] or [phone]   R Recommendations: See Admitting Provider Note  Report given to:   Additional Notes: Wife reports slow decline over the past few months but lately getting worse

## 2022-08-22 DIAGNOSIS — D649 Anemia, unspecified: Secondary | ICD-10-CM | POA: Diagnosis present

## 2022-08-22 DIAGNOSIS — Z8673 Personal history of transient ischemic attack (TIA), and cerebral infarction without residual deficits: Secondary | ICD-10-CM | POA: Diagnosis not present

## 2022-08-22 DIAGNOSIS — J9811 Atelectasis: Secondary | ICD-10-CM | POA: Diagnosis present

## 2022-08-22 DIAGNOSIS — N1831 Chronic kidney disease, stage 3a: Secondary | ICD-10-CM | POA: Diagnosis present

## 2022-08-22 DIAGNOSIS — Z8612 Personal history of poliomyelitis: Secondary | ICD-10-CM | POA: Diagnosis not present

## 2022-08-22 DIAGNOSIS — I129 Hypertensive chronic kidney disease with stage 1 through stage 4 chronic kidney disease, or unspecified chronic kidney disease: Secondary | ICD-10-CM | POA: Diagnosis present

## 2022-08-22 DIAGNOSIS — Z8249 Family history of ischemic heart disease and other diseases of the circulatory system: Secondary | ICD-10-CM | POA: Diagnosis not present

## 2022-08-22 DIAGNOSIS — Z833 Family history of diabetes mellitus: Secondary | ICD-10-CM | POA: Diagnosis not present

## 2022-08-22 DIAGNOSIS — G9389 Other specified disorders of brain: Secondary | ICD-10-CM | POA: Diagnosis present

## 2022-08-22 DIAGNOSIS — Z79899 Other long term (current) drug therapy: Secondary | ICD-10-CM | POA: Diagnosis not present

## 2022-08-22 DIAGNOSIS — Z993 Dependence on wheelchair: Secondary | ICD-10-CM | POA: Diagnosis not present

## 2022-08-22 DIAGNOSIS — Z7401 Bed confinement status: Secondary | ICD-10-CM | POA: Diagnosis not present

## 2022-08-22 DIAGNOSIS — D631 Anemia in chronic kidney disease: Secondary | ICD-10-CM | POA: Diagnosis present

## 2022-08-22 DIAGNOSIS — Z8711 Personal history of peptic ulcer disease: Secondary | ICD-10-CM | POA: Diagnosis not present

## 2022-08-22 DIAGNOSIS — Z7982 Long term (current) use of aspirin: Secondary | ICD-10-CM | POA: Diagnosis not present

## 2022-08-22 DIAGNOSIS — G9340 Encephalopathy, unspecified: Secondary | ICD-10-CM | POA: Diagnosis not present

## 2022-08-22 DIAGNOSIS — N400 Enlarged prostate without lower urinary tract symptoms: Secondary | ICD-10-CM | POA: Diagnosis present

## 2022-08-22 DIAGNOSIS — J90 Pleural effusion, not elsewhere classified: Secondary | ICD-10-CM | POA: Diagnosis present

## 2022-08-22 DIAGNOSIS — N3001 Acute cystitis with hematuria: Secondary | ICD-10-CM | POA: Diagnosis present

## 2022-08-22 DIAGNOSIS — Z79890 Hormone replacement therapy (postmenopausal): Secondary | ICD-10-CM | POA: Diagnosis not present

## 2022-08-22 DIAGNOSIS — N3000 Acute cystitis without hematuria: Secondary | ICD-10-CM

## 2022-08-22 DIAGNOSIS — R531 Weakness: Secondary | ICD-10-CM | POA: Diagnosis not present

## 2022-08-22 DIAGNOSIS — H409 Unspecified glaucoma: Secondary | ICD-10-CM | POA: Diagnosis present

## 2022-08-22 DIAGNOSIS — F0390 Unspecified dementia without behavioral disturbance: Secondary | ICD-10-CM | POA: Diagnosis present

## 2022-08-22 DIAGNOSIS — T8383XA Hemorrhage of genitourinary prosthetic devices, implants and grafts, initial encounter: Secondary | ICD-10-CM | POA: Diagnosis present

## 2022-08-22 DIAGNOSIS — Z66 Do not resuscitate: Secondary | ICD-10-CM | POA: Diagnosis present

## 2022-08-22 DIAGNOSIS — Y731 Therapeutic (nonsurgical) and rehabilitative gastroenterology and urology devices associated with adverse incidents: Secondary | ICD-10-CM | POA: Diagnosis present

## 2022-08-22 DIAGNOSIS — E1122 Type 2 diabetes mellitus with diabetic chronic kidney disease: Secondary | ICD-10-CM | POA: Diagnosis present

## 2022-08-22 DIAGNOSIS — G9341 Metabolic encephalopathy: Secondary | ICD-10-CM | POA: Diagnosis present

## 2022-08-22 LAB — CBC
HCT: 33.8 % — ABNORMAL LOW (ref 39.0–52.0)
Hemoglobin: 10.8 g/dL — ABNORMAL LOW (ref 13.0–17.0)
MCH: 29.8 pg (ref 26.0–34.0)
MCHC: 32 g/dL (ref 30.0–36.0)
MCV: 93.4 fL (ref 80.0–100.0)
Platelets: 260 10*3/uL (ref 150–400)
RBC: 3.62 MIL/uL — ABNORMAL LOW (ref 4.22–5.81)
RDW: 17.3 % — ABNORMAL HIGH (ref 11.5–15.5)
WBC: 7.3 10*3/uL (ref 4.0–10.5)
nRBC: 0 % (ref 0.0–0.2)

## 2022-08-22 LAB — COMPREHENSIVE METABOLIC PANEL
ALT: 10 U/L (ref 0–44)
AST: 10 U/L — ABNORMAL LOW (ref 15–41)
Albumin: 2.8 g/dL — ABNORMAL LOW (ref 3.5–5.0)
Alkaline Phosphatase: 90 U/L (ref 38–126)
Anion gap: 11 (ref 5–15)
BUN: 17 mg/dL (ref 8–23)
CO2: 17 mmol/L — ABNORMAL LOW (ref 22–32)
Calcium: 8.7 mg/dL — ABNORMAL LOW (ref 8.9–10.3)
Chloride: 111 mmol/L (ref 98–111)
Creatinine, Ser: 1.26 mg/dL — ABNORMAL HIGH (ref 0.61–1.24)
GFR, Estimated: 56 mL/min — ABNORMAL LOW (ref >60)
Glucose, Bld: 80 mg/dL (ref 70–99)
Potassium: 4.1 mmol/L (ref 3.5–5.1)
Sodium: 139 mmol/L (ref 135–145)
Total Bilirubin: 0.5 mg/dL (ref 0.3–1.2)
Total Protein: 6.4 g/dL — ABNORMAL LOW (ref 6.5–8.1)

## 2022-08-22 LAB — GLUCOSE, CAPILLARY
Glucose-Capillary: 69 mg/dL — ABNORMAL LOW (ref 70–99)
Glucose-Capillary: 113 mg/dL — ABNORMAL HIGH (ref 70–99)
Glucose-Capillary: 126 mg/dL — ABNORMAL HIGH (ref 70–99)
Glucose-Capillary: 122 mg/dL — ABNORMAL HIGH (ref 70–99)
Glucose-Capillary: 145 mg/dL — ABNORMAL HIGH (ref 70–99)

## 2022-08-22 MED ORDER — DEXTROSE 50 % IV SOLN
12.5000 g | INTRAVENOUS | Status: AC
  Administered 2022-08-22: 12.5 g via INTRAVENOUS
  Filled 2022-08-22: qty 50

## 2022-08-22 NOTE — Progress Notes (Signed)
PROGRESS NOTE    Mike Mack  [id number] DOB: 1935/07/11 DOA: 08/21/2022 PCP: [name], MD   Brief Narrative:  HPI: Mike Mack is a 87 y.o. male with medical history significant of diabetes, stroke, dementia, polio, CKD 3A, BPH, anemia, glaucoma presenting with altered mental status.   History obtained with assistance of chart review and family due to patient's baseline dementia and altered mental status.  Patient has known history of dementia at baseline as above and at baseline also has lower extremity weakness right greater than left and is largely bedbound/wheelchair-bound.  Wife reports that since his discharge last month he was at baseline until this morning.  She found him more difficult to awaken and he was less responsive.  Had intermittent decreases in responsiveness throughout the day.  He reports some dysuria.  He had an episode of brown emesis today but otherwise denies nausea or vomiting.  Does have history of UTI and altered mental status most recently last month and was admitted for this with improvement after treatment with antibiotics.   Unable to obtain full review of systems due to patient's altered mentation and baseline dementia, but no specific complaints at this time.   ED Course: Vital signs in the ED significant for blood pressure in the [id number] to [id number] systolic.  Lab workup included CMP with sodium 133, glucose 156, albumin 3.2.  CBC within normal limits.  UDS pending.  Urinalysis showed hemoglobin, protein, leukocytes, rare bacteria.  Chest x-ray showed minimal right basilar atelectasis and minimal pleural effusion.  CT head showed chronic changes that are stable as well as ventriculomegaly with some features that could be seen in normal pressure hydrocephalus however the ventriculomegaly is not new.  Patient received IV Cipro in the ED.  Assessment & Plan:   Active Problems:   CKD (chronic kidney disease) stage 3, GFR 30-59 ml/min (HCC)    Hypertension   BPH (benign prostatic hyperplasia)   Anemia   H/O: CVA (cerebrovascular accident)   H/O poliomyelitis   Type 2 diabetes mellitus ([place])   Dementia ([place])   UTI (urinary tract infection)   Glaucoma   Acute encephalopathy  Acute encephalopathy secondary to UTI: Patient presenting with altered mentation.  Episode of brown emesis and is reporting dysuria. Has history of prior encephalopathy with UTI including last month and treated. Somewhat abnormal CT head with ventriculomegaly which is not new however this CT notes there is some changes that could be consistent with NPH.  Will treat for UTI and consider further investigation if patient does not respond appropriately, however patient has already started to improve, he is fully alert, oriented to person and place.  Wife at the bedside.  Patient unable to tell the month or the year.  Per wife, he is much better but not back to baseline yet.  Will continue Rocephin and follow culture and reassess tomorrow.   History of stroke - Continue home atorvastatin and aspirin   Hypertension -Blood pressure controlled.  Continue irbesartan.  Amlodipine on hold.   CKD 3A > Creatinine stable in the ED - Trend renal function and electrolytes   BPH - Continue home tamsulosin   Diabetes melitis type II: -Last hemoglobin A1c 5.5 about 4 months ago, currently on SSI, was hypoglycemic this morning 69 blood sugar, likely due to being n.p.o., he has passed swallow, will start him on diet.   Glaucoma - Continue home eyedrops   History of polio - Noted  DVT prophylaxis: enoxaparin (LOVENOX) injection 40 mg Start:  08/21/22 1300   Code Status: DNR  Family Communication: Wife present at bedside.  Plan of care discussed with patient in length and he/she verbalized understanding and agreed with it.  Status is: Observation The patient will require care spanning > 2 midnights and should be moved to inpatient because: Improving but not back to  baseline, needs another day.   Estimated body mass index is 25.02 kg/m as calculated from the following:   Height as of this encounter: '5\' 6"'$  (1.676 m).   Weight as of this encounter: 70.3 kg.  Pressure Injury 03/09/22 Buttocks Right Stage 3 -  Full thickness tissue loss. Subcutaneous fat may be visible but bone, tendon or muscle are NOT exposed. (Active)  03/09/22 0848  Location: Buttocks  Location Orientation: Right  Staging: Stage 3 -  Full thickness tissue loss. Subcutaneous fat may be visible but bone, tendon or muscle are NOT exposed.  Wound Description (Comments):   Present on Admission:   Dressing Type Foam - Lift dressing to assess site every shift 08/11/22 0812     Pressure Injury 08/08/22 Ankle Left;Lateral Unstageable - Full thickness tissue loss in which the base of the injury is covered by slough (yellow, tan, gray, green or brown) and/or eschar (tan, brown or black) in the wound bed. Unstagable pressure injury (Active)  08/08/22 1800  Location: Ankle  Location Orientation: Left;Lateral  Staging: Unstageable - Full thickness tissue loss in which the base of the injury is covered by slough (yellow, tan, gray, green or brown) and/or eschar (tan, brown or black) in the wound bed.  Wound Description (Comments): Unstagable pressure injury, Scab present,  Present on Admission: Yes  Dressing Type Foam - Lift dressing to assess site every shift 08/11/22 0812   Nutritional Assessment: Body mass index is 25.02 kg/m.Marland Kitchen Seen by dietician.  I agree with the assessment and plan as outlined below: Nutrition Status:        . Skin Assessment: I have examined the patient's skin and I agree with the wound assessment as performed by the wound care RN as outlined below: Pressure Injury 03/09/22 Buttocks Right Stage 3 -  Full thickness tissue loss. Subcutaneous fat may be visible but bone, tendon or muscle are NOT exposed. (Active)  03/09/22 0848  Location: Buttocks  Location  Orientation: Right  Staging: Stage 3 -  Full thickness tissue loss. Subcutaneous fat may be visible but bone, tendon or muscle are NOT exposed.  Wound Description (Comments):   Present on Admission:   Dressing Type Foam - Lift dressing to assess site every shift 08/11/22 0812     Pressure Injury 08/08/22 Ankle Left;Lateral Unstageable - Full thickness tissue loss in which the base of the injury is covered by slough (yellow, tan, gray, green or brown) and/or eschar (tan, brown or black) in the wound bed. Unstagable pressure injury (Active)  08/08/22 1800  Location: Ankle  Location Orientation: Left;Lateral  Staging: Unstageable - Full thickness tissue loss in which the base of the injury is covered by slough (yellow, tan, gray, green or brown) and/or eschar (tan, brown or black) in the wound bed.  Wound Description (Comments): Unstagable pressure injury, Scab present,  Present on Admission: Yes  Dressing Type Foam - Lift dressing to assess site every shift 08/11/22 [id number]    Consultants:  None  Procedures:  None  Antimicrobials:  Anti-infectives (From admission, onward)    Start     Dose/Rate Route Frequency Ordered Stop   08/21/22 1315  cefTRIAXone (ROCEPHIN) 2  g in sodium chloride 0.9 % 100 mL IVPB        2 g 200 mL/hr over 30 Minutes Intravenous Daily 08/21/22 1306     08/21/22 1245  ciprofloxacin (CIPRO) IVPB 400 mg  Status:  Discontinued        400 mg 200 mL/hr over 60 Minutes Intravenous STAT 08/21/22 1232 08/21/22 1306         Subjective: Patient seen and examined.  He is fully alert and oriented x 2.  He has no complaints.  Wife at the bedside.  Objective: Vitals:   08/21/22 1542 08/21/22 2040 08/22/22 0455 08/22/22 0806  BP: 107/64 138/75 (!) 150/77 130/69  Pulse: 67 78 86 87  Resp: '12 15 15   '$ Temp: (!) 97.4 F (36.3 C) 98 F (36.7 C) 98.1 F (36.7 C) 98.5 F (36.9 C)  TempSrc: Oral Oral  Oral  SpO2: 98% 100% 100% 100%  Weight:      Height:         Intake/Output Summary (Last 24 hours) at 08/22/2022 1024 Last data filed at 08/22/2022 0455 Gross per 24 hour  Intake 100 ml  Output 200 ml  Net -100 ml   Filed Weights   08/21/22 0902  Weight: 70.3 kg    Examination:  General exam: Appears calm and comfortable  Respiratory system: Clear to auscultation. Respiratory effort normal. Cardiovascular system: S1 & S2 heard, RRR. No JVD, murmurs, rubs, gallops or clicks. No pedal edema. Gastrointestinal system: Abdomen is nondistended, soft and nontender. No organomegaly or masses felt. Normal bowel sounds heard. Central nervous system: Alert and oriented x 2. No focal neurological deficits. Extremities: Symmetric 5 x 5 power. Skin: No rashes, lesions or ulcers  Data Reviewed: I have personally reviewed following labs and imaging studies  CBC: Recent Labs  Lab 08/21/22 0850 08/22/22 0738  WBC 10.1 7.3  NEUTROABS 7.1  --   HGB 13.3 10.8*  HCT 40.6 33.8*  MCV 92.5 93.4  PLT 237 [id number]   Basic Metabolic Panel: Recent Labs  Lab 08/21/22 1100 08/22/22 0738  NA 133* 139  K 4.4 4.1  CL 104 111  CO2 24 17*  GLUCOSE 156* 80  BUN 17 17  CREATININE 1.23 1.26*  CALCIUM 9.1 8.7*   GFR: Estimated Creatinine Clearance: 39.4 mL/min (A) (by C-G formula based on SCr of 1.26 mg/dL (H)). Liver Function Tests: Recent Labs  Lab 08/21/22 1100 08/22/22 0738  AST 15 10*  ALT 11 10  ALKPHOS 111 90  BILITOT 0.7 0.5  PROT 6.6 6.4*  ALBUMIN 3.2* 2.8*   No results for input(s): "LIPASE", "AMYLASE" in the last 168 hours. No results for input(s): "AMMONIA" in the last 168 hours. Coagulation Profile: No results for input(s): "INR", "PROTIME" in the last 168 hours. Cardiac Enzymes: No results for input(s): "CKTOTAL", "CKMB", "CKMBINDEX", "TROPONINI" in the last 168 hours. BNP (last 3 results) No results for input(s): "PROBNP" in the last 8760 hours. HbA1C: No results for input(s): "HGBA1C" in the last 72 hours. CBG: Recent Labs  Lab  08/21/22 0859 08/21/22 1627 08/21/22 2042 08/22/22 0726 08/22/22 0837  GLUCAP 138* 124* 93 69* 113*   Lipid Profile: No results for input(s): "CHOL", "HDL", "LDLCALC", "TRIG", "CHOLHDL", "LDLDIRECT" in the last 72 hours. Thyroid Function Tests: No results for input(s): "TSH", "T4TOTAL", "FREET4", "T3FREE", "THYROIDAB" in the last 72 hours. Anemia Panel: No results for input(s): "VITAMINB12", "FOLATE", "FERRITIN", "TIBC", "IRON", "RETICCTPCT" in the last 72 hours. Sepsis Labs: No results for input(s): "PROCALCITON", "  LATICACIDVEN" in the last 168 hours.  No results found for this or any previous visit (from the past 240 hour(s)).   Radiology Studies: CT HEAD WO CONTRAST  Result Date: 08/21/2022 CLINICAL DATA:  Altered mental status.  Lethargy and brown emesis EXAM: CT HEAD WITHOUT CONTRAST TECHNIQUE: Contiguous axial images were obtained from the base of the skull through the vertex without intravenous contrast. RADIATION DOSE REDUCTION: This exam was performed according to the departmental dose-optimization program which includes automated exposure control, adjustment of the mA and/or kV according to patient size and/or use of iterative reconstruction technique. COMPARISON:  08/08/2022 FINDINGS: Brain: No evidence of acute infarction, hemorrhage, hydrocephalus, extra-axial collection or mass lesion/mass effect. Chronic bilateral cerebellar and left deep internal capsule infarcts. Chronic small vessel ischemia with patchy low-density in the cerebral white matter. Generalized atrophy with ventriculomegaly. There is callosum angle narrowing and upward bulging of the corpus callosum, a degree of communicating hydrocephalus is possible. Vascular: No hyperdense vessel or unexpected calcification. Skull: Normal. Negative for fracture or focal lesion. Sinuses/Orbits: No acute finding. IMPRESSION: 1. No acute finding or change from 08/08/2022. 2. Chronic small vessel disease with chronic small vessel  infarcts. 3. Ventriculomegaly with some features seen in normal pressure hydrocephalus. Electronically Signed   By: Jorje Guild M.D.   On: 08/21/2022 11:49   DG Chest Port 1 View  Result Date: 08/21/2022 CLINICAL DATA:  Cough. EXAM: PORTABLE CHEST 1 VIEW COMPARISON:  August 08, 2022. FINDINGS: The heart size and mediastinal contours are within normal limits. Minimal right basilar subsegmental atelectasis is noted. Minimal pleural effusions are noted. The visualized skeletal structures are unremarkable. IMPRESSION: Minimal right basilar subsegmental atelectasis. Minimal pleural effusions. Electronically Signed   By: Marijo Conception M.D.   On: 08/21/2022 09:38    Scheduled Meds:  aspirin EC  81 mg Oral Daily   atorvastatin  20 mg Oral QPM   brimonidine  1 drop Both Eyes TID   dextrose  12.5 g Intravenous STAT   dorzolamide-timolol  1 drop Both Eyes BID   enoxaparin (LOVENOX) injection  40 mg Subcutaneous Daily   insulin aspart  0-9 Units Subcutaneous TID WC   irbesartan  150 mg Oral Daily   levothyroxine  88 mcg Oral Q0600   memantine  10 mg Oral BID   pantoprazole  40 mg Oral Daily   sodium chloride flush  3 mL Intravenous Q12H   tamsulosin  0.4 mg Oral BID   Continuous Infusions:  sodium chloride 125 mL/hr at 08/22/22 0840   cefTRIAXone (ROCEPHIN)  IV 2 g (08/22/22 0909)     LOS: 0 days   Darliss Cheney, MD [facility]  08/22/2022, 10:24 AM   *Please note that this is a verbal dictation therefore any spelling or grammatical errors are due to the "[place]" system interpretation.  Please page via [place] and do not message via secure chat for urgent patient care matters. Secure chat can be used for non urgent patient care matters.  How to contact the [facility] Attending or Consulting provider [place] or covering provider during after hours [place], for this patient?  Check the care team in [facility] and look for a) attending/consulting TRH provider listed and b) the [facility] team  listed. Page or secure chat [phone]. Log into [email or web address] and use [place]'s universal password to access. If you do not have the password, please contact the hospital operator. Locate the [facility] provider you are looking for under [facility] and  page to a number that you can be directly reached. If you still have difficulty reaching the provider, please page the [facility] (Director on Call) for the Hospitalists listed on amion for assistance.

## 2022-08-22 NOTE — Progress Notes (Cosign Needed)
  Transition of Care [facility]) Screening Note   Patient Details  Name: Mike Mack Date of Birth: 22-Apr-1936   Transition of Care [facility]) CM/SW Contact:    [name], Renea Ee, RN Phone Number: 08/22/2022, 3:31 PM  Patient is admitted for Acute Encephalopathy 2/2 UTI, on IV abx. Has hx of Polio, Stroke, Dementia,CKD 3A, and BPH. Currently A&O to self and place.    From home with wife who is his primary caregiver and his grandson. Has two supportive children. Has necessary DME's at home. Family transports to and from his appointments. Patient is wheelchair bound at home.  PCP is [name], MD and uses [facility] on [address].   Transition of Care Department [facility]) has reviewed patient and no TOC needs or recommendations have been identified at this time. TOC will continue to monitor patient advancement through interdisciplinary progression rounds. If new patient transition needs arise, please place a TOC consult.

## 2022-08-23 DIAGNOSIS — N3000 Acute cystitis without hematuria: Secondary | ICD-10-CM | POA: Diagnosis not present

## 2022-08-23 LAB — CBC WITH DIFFERENTIAL/PLATELET
Abs Immature Granulocytes: 0.01 10*3/uL (ref 0.00–0.07)
Basophils Absolute: 0 10*3/uL (ref 0.0–0.1)
Basophils Relative: 1 %
Eosinophils Absolute: 0.1 10*3/uL (ref 0.0–0.5)
Eosinophils Relative: 1 %
HCT: 30.6 % — ABNORMAL LOW (ref 39.0–52.0)
Hemoglobin: 9.8 g/dL — ABNORMAL LOW (ref 13.0–17.0)
Immature Granulocytes: 0 %
Lymphocytes Relative: 31 %
Lymphs Abs: 1.7 10*3/uL (ref 0.7–4.0)
MCH: 29.7 pg (ref 26.0–34.0)
MCHC: 32 g/dL (ref 30.0–36.0)
MCV: 92.7 fL (ref 80.0–100.0)
Monocytes Absolute: 0.6 10*3/uL (ref 0.1–1.0)
Monocytes Relative: 11 %
Neutro Abs: 3 10*3/uL (ref 1.7–7.7)
Neutrophils Relative %: 56 %
Platelets: 238 10*3/uL (ref 150–400)
RBC: 3.3 MIL/uL — ABNORMAL LOW (ref 4.22–5.81)
RDW: 17.3 % — ABNORMAL HIGH (ref 11.5–15.5)
WBC: 5.4 10*3/uL (ref 4.0–10.5)
nRBC: 0 % (ref 0.0–0.2)

## 2022-08-23 LAB — BASIC METABOLIC PANEL
Anion gap: 6 (ref 5–15)
BUN: 18 mg/dL (ref 8–23)
CO2: 22 mmol/L (ref 22–32)
Calcium: 8.5 mg/dL — ABNORMAL LOW (ref 8.9–10.3)
Chloride: 110 mmol/L (ref 98–111)
Creatinine, Ser: 1.14 mg/dL (ref 0.61–1.24)
GFR, Estimated: >60 mL/min (ref >60)
Glucose, Bld: 112 mg/dL — ABNORMAL HIGH (ref 70–99)
Potassium: 4 mmol/L (ref 3.5–5.1)
Sodium: 138 mmol/L (ref 135–145)

## 2022-08-23 LAB — GLUCOSE, CAPILLARY
Glucose-Capillary: 102 mg/dL — ABNORMAL HIGH (ref 70–99)
Glucose-Capillary: 193 mg/dL — ABNORMAL HIGH (ref 70–99)
Glucose-Capillary: 131 mg/dL — ABNORMAL HIGH (ref 70–99)
Glucose-Capillary: 187 mg/dL — ABNORMAL HIGH (ref 70–99)

## 2022-08-23 MED ORDER — ADULT MULTIVITAMIN W/MINERALS CH
1.0000 | ORAL_TABLET | Freq: Every day | ORAL | Status: DC
  Administered 2022-08-23 – 2022-08-24 (×2): 1 via ORAL
  Filled 2022-08-23 (×2): qty 1

## 2022-08-23 MED ORDER — VITAMIN C 500 MG PO TABS
500.0000 mg | ORAL_TABLET | Freq: Every day | ORAL | Status: DC
  Administered 2022-08-23 – 2022-08-24 (×2): 500 mg via ORAL
  Filled 2022-08-23 (×2): qty 1

## 2022-08-23 MED ORDER — ZINC SULFATE 220 (50 ZN) MG PO CAPS
220.0000 mg | ORAL_CAPSULE | Freq: Every day | ORAL | Status: DC
  Administered 2022-08-23 – 2022-08-24 (×2): 220 mg via ORAL
  Filled 2022-08-23 (×2): qty 1

## 2022-08-23 MED ORDER — ENSURE ENLIVE PO LIQD
237.0000 mL | Freq: Two times a day (BID) | ORAL | Status: DC
  Administered 2022-08-23 – 2022-08-24 (×2): 237 mL via ORAL

## 2022-08-23 NOTE — Progress Notes (Signed)
Initial Nutrition Assessment  DOCUMENTATION CODES:   Not applicable  INTERVENTION:  - Liberalize to Regular diet.  - Add Ensure Enlive po BID, each supplement provides 350 kcal and 20 grams of protein.  - Add Vitamin C.   - Add Zinc (x14 days).   - Add MVI q day.   NUTRITION DIAGNOSIS:   Increased nutrient needs related to wound healing as evidenced by estimated needs.  GOAL:   Patient will meet greater than or equal to 90% of their needs  MONITOR:   PO intake, Supplement acceptance  REASON FOR ASSESSMENT:   Malnutrition Screening Tool    ASSESSMENT:   87 y.o. male admits related to AMS. PMH includes: T2DM, HTN, hx of CVA, CKD stage 3, and Polio. Pt is currently receiving medical management related to acute encephalopathy.  Meds reviewed: lipitor, sliding scale insulin. Labs reviewed.   The pt was sleeping at time of assessment. Pt's wife was at bedside. She reports that he has been eating well since admission and PTA as well. Per record, pt has eaten 50% of his meals today. Pt with increased nutrient needs related to wound healing. RD will liberalize diet to Regular and add Ensure BID. Will continue to monitor PO intakes.   NUTRITION - FOCUSED PHYSICAL EXAM:  Unable to assess due to pt sleeping - attempt at f/u.   Diet Order:   Diet Order             Diet Heart Room service appropriate? Yes; Fluid consistency: Thin  Diet effective now                   EDUCATION NEEDS:   Not appropriate for education at this time  Skin:  Skin Assessment: Skin Integrity Issues: Skin Integrity Issues:: Unstageable, Stage III Stage III: right buttocks Unstageable: left ankle  Last BM:  3/4  Height:   Ht Readings from Last 1 Encounters:  08/22/22 '5\' 5"'$  (1.651 m)    Weight:   Wt Readings from Last 1 Encounters:  08/22/22 66.7 kg    Ideal Body Weight:     BMI:  Body mass index is 24.47 kg/m.  Estimated Nutritional Needs:   Kcal:  [id number]  kcals  Protein:  100-115 gm  Fluid:  >/= 2 L  Thalia Bloodgood, RD, LDN, CNSC.

## 2022-08-23 NOTE — Consult Note (Signed)
   [facility] CM Inpatient Consult   08/23/2022  Mike Mack 11-Mar-1936 [id number]  [place] [facility] [ACO] Patient: [name]  Primary Care Provider:  [name], MD, [facility]   Patient screened for less than 30 days reamdission hospitalization with noted high risk score for unplanned readmission risk to assess for potential [place] Management service needs for post hospital transition for care coordination.  Review of patient's electronic medical record reveals patient is active with [facility] Palliative program prior to admission.  On unit rounds patient asleep [patient with noted dementia at baseline wheelchair bound noted per MD progress notes] and no family currently at the bedside.  Did not awaken. Chart reviewed for potential post hospital follow up with [facility] staff for care coordination needs due to high readmission score. Pending disposition   Plan:  Continue to follow progress and disposition to assess for post hospital community care coordination/management needs.  Referral request for community care coordination:  Of note, [place] does not replace or interfere with any arrangements made by the Inpatient Transition of Care team.  For questions contact:   [name], RN BSN [place]  [phone] business mobile phone Toll free office [phone]  *[place]  [phone] Fax number: [phone] [email or web address]

## 2022-08-23 NOTE — Progress Notes (Signed)
PROGRESS NOTE    Mike Mack  [id number] DOB: 13-Sep-1935 DOA: 08/21/2022 PCP: [name], MD   Brief Narrative:  HPI: Mike Mack is a 87 y.o. male with medical history significant of diabetes, stroke, dementia, polio, CKD 3A, BPH, anemia, glaucoma presenting with altered mental status.   History obtained with assistance of chart review and family due to patient's baseline dementia and altered mental status.  Patient has known history of dementia at baseline as above and at baseline also has lower extremity weakness right greater than left and is largely bedbound/wheelchair-bound.  Wife reports that since his discharge last month he was at baseline until this morning.  She found him more difficult to awaken and he was less responsive.  Had intermittent decreases in responsiveness throughout the day.  He reports some dysuria.  He had an episode of brown emesis today but otherwise denies nausea or vomiting.  Does have history of UTI and altered mental status most recently last month and was admitted for this with improvement after treatment with antibiotics.   Unable to obtain full review of systems due to patient's altered mentation and baseline dementia, but no specific complaints at this time.   ED Course: Vital signs in the ED significant for blood pressure in the [id number] to [id number] systolic.  Lab workup included CMP with sodium 133, glucose 156, albumin 3.2.  CBC within normal limits.  UDS pending.  Urinalysis showed hemoglobin, protein, leukocytes, rare bacteria.  Chest x-ray showed minimal right basilar atelectasis and minimal pleural effusion.  CT head showed chronic changes that are stable as well as ventriculomegaly with some features that could be seen in normal pressure hydrocephalus however the ventriculomegaly is not new.  Patient received IV Cipro in the ED.  Assessment & Plan:   Principal Problem:   UTI (urinary tract infection) Active Problems:   CKD (chronic kidney  disease) stage 3, GFR 30-59 ml/min (HCC)   Hypertension   BPH (benign prostatic hyperplasia)   Anemia   H/O: CVA (cerebrovascular accident)   H/O poliomyelitis   Type 2 diabetes mellitus ([place])   Dementia ([place])   Glaucoma   Acute encephalopathy  Acute encephalopathy secondary to UTI: Patient presenting with altered mentation.  Episode of brown emesis and is reporting dysuria. Has history of prior encephalopathy with UTI including last month and treated. Somewhat abnormal CT head with ventriculomegaly which is not new however this CT notes there is some changes that could be consistent with NPH.  Patient is fully alert and oriented today, back at baseline.  Verified by the wife who is at the bedside.  Unfortunately, urine was never sent for culture.  We will continue current antibiotics and eventually switch him to oral at the time of discharge.   History of stroke - Continue home atorvastatin and aspirin   Hypertension -Blood pressure controlled.  Continue irbesartan.  Amlodipine on hold.   CKD 3A > Creatinine stable in the ED - Trend renal function and electrolytes   BPH/gross hematuria/acute on chronic anemia -Reportedly, patient had urethral trauma when he was placed Foley catheter in the ED, he has been having frank hematuria.  It appears to have been improving now.  We will watch him another 24 hours.  Continue home tamsulosin.  Patient's baseline hemoglobin appears to be between 11-12 and has dropped to 9.8, likely due to gross hematuria but no indication of transfusion.  Monitor daily.   Diabetes melitis type II: -Last hemoglobin A1c 5.5 about 4 months ago, currently  on SSI,    Glaucoma - Continue home eyedrops   History of polio - Noted  DVT prophylaxis: enoxaparin (LOVENOX) injection 40 mg Start: 08/21/22 1300   Code Status: DNR  Family Communication: Wife present at bedside.  Plan of care discussed with patient in length and he/she verbalized understanding and agreed  with it.  Status is: Inpatient Remains inpatient appropriate because: Close hematuria.  Needs monitoring.     Estimated body mass index is 24.47 kg/m as calculated from the following:   Height as of this encounter: '5\' 5"'$  (1.651 m).   Weight as of this encounter: 66.7 kg.  Pressure Injury 03/09/22 Buttocks Right Stage 3 -  Full thickness tissue loss. Subcutaneous fat may be visible but bone, tendon or muscle are NOT exposed. (Active)  03/09/22 0848  Location: Buttocks  Location Orientation: Right  Staging: Stage 3 -  Full thickness tissue loss. Subcutaneous fat may be visible but bone, tendon or muscle are NOT exposed.  Wound Description (Comments):   Present on Admission:   Dressing Type Foam - Lift dressing to assess site every shift 08/11/22 0812     Pressure Injury 08/08/22 Ankle Left;Lateral Unstageable - Full thickness tissue loss in which the base of the injury is covered by slough (yellow, tan, gray, green or brown) and/or eschar (tan, brown or black) in the wound bed. Unstagable pressure injury (Active)  08/08/22 1800  Location: Ankle  Location Orientation: Left;Lateral  Staging: Unstageable - Full thickness tissue loss in which the base of the injury is covered by slough (yellow, tan, gray, green or brown) and/or eschar (tan, brown or black) in the wound bed.  Wound Description (Comments): Unstagable pressure injury, Scab present,  Present on Admission: Yes  Dressing Type Foam - Lift dressing to assess site every shift 08/11/22 0812   Nutritional Assessment: Body mass index is 24.47 kg/m.Marland Kitchen Seen by dietician.  I agree with the assessment and plan as outlined below: Nutrition Status:        . Skin Assessment: I have examined the patient's skin and I agree with the wound assessment as performed by the wound care RN as outlined below: Pressure Injury 03/09/22 Buttocks Right Stage 3 -  Full thickness tissue loss. Subcutaneous fat may be visible but bone, tendon or  muscle are NOT exposed. (Active)  03/09/22 0848  Location: Buttocks  Location Orientation: Right  Staging: Stage 3 -  Full thickness tissue loss. Subcutaneous fat may be visible but bone, tendon or muscle are NOT exposed.  Wound Description (Comments):   Present on Admission:   Dressing Type Foam - Lift dressing to assess site every shift 08/11/22 0812     Pressure Injury 08/08/22 Ankle Left;Lateral Unstageable - Full thickness tissue loss in which the base of the injury is covered by slough (yellow, tan, gray, green or brown) and/or eschar (tan, brown or black) in the wound bed. Unstagable pressure injury (Active)  08/08/22 1800  Location: Ankle  Location Orientation: Left;Lateral  Staging: Unstageable - Full thickness tissue loss in which the base of the injury is covered by slough (yellow, tan, gray, green or brown) and/or eschar (tan, brown or black) in the wound bed.  Wound Description (Comments): Unstagable pressure injury, Scab present,  Present on Admission: Yes  Dressing Type Foam - Lift dressing to assess site every shift 08/11/22 [id number]    Consultants:  None  Procedures:  None  Antimicrobials:  Anti-infectives (From admission, onward)    Start     Dose/Rate  Route Frequency Ordered Stop   08/21/22 1315  cefTRIAXone (ROCEPHIN) 2 g in sodium chloride 0.9 % 100 mL IVPB        2 g 200 mL/hr over 30 Minutes Intravenous Daily 08/21/22 1306     08/21/22 1245  ciprofloxacin (CIPRO) IVPB 400 mg  Status:  Discontinued        400 mg 200 mL/hr over 60 Minutes Intravenous STAT 08/21/22 1232 08/21/22 1306         Subjective: Patient seen and examined.  He has no complaints.  He is alert and did to place and person as well as here.  Objective: Vitals:   08/22/22 1938 08/22/22 2131 08/23/22 0330 08/23/22 0923  BP: (!) 127/58 (!) 118/58 (!) 121/57 (!) 100/46  Pulse: 79 79 77 74  Resp: '16 15 16 17  '$ Temp: 98.9 F (37.2 C) 99.2 F (37.3 C) 99 F (37.2 C) 98.5 F (36.9 C)   TempSrc: Oral Oral Oral Oral  SpO2: 100% 100% 100% 100%  Weight:  66.7 kg    Height:  '5\' 5"'$  (1.651 m)      Intake/Output Summary (Last 24 hours) at 08/23/2022 1039 Last data filed at 08/23/2022 0800 Gross per 24 hour  Intake 1020 ml  Output 600 ml  Net 420 ml    Filed Weights   08/21/22 0902 08/22/22 2131  Weight: 70.3 kg 66.7 kg    Examination:  General exam: Appears calm and comfortable  Respiratory system: Clear to auscultation. Respiratory effort normal. Cardiovascular system: S1 & S2 heard, RRR. No JVD, murmurs, rubs, gallops or clicks. No pedal edema. Gastrointestinal system: Abdomen is nondistended, soft and nontender. No organomegaly or masses felt. Normal bowel sounds heard. Central nervous system: Alert and oriented x 3. No focal neurological deficits.   Data Reviewed: I have personally reviewed following labs and imaging studies  CBC: Recent Labs  Lab 08/21/22 0850 08/22/22 0738 08/23/22 0626  WBC 10.1 7.3 5.4  NEUTROABS 7.1  --  3.0  HGB 13.3 10.8* 9.8*  HCT 40.6 33.8* 30.6*  MCV 92.5 93.4 92.7  PLT 237 260 [id number]    Basic Metabolic Panel: Recent Labs  Lab 08/21/22 1100 08/22/22 0738 08/23/22 0626  NA 133* 139 138  K 4.4 4.1 4.0  CL 104 111 110  CO2 24 17* 22  GLUCOSE 156* 80 112*  BUN '17 17 18  '$ CREATININE 1.23 1.26* 1.14  CALCIUM 9.1 8.7* 8.5*    GFR: Estimated Creatinine Clearance: 42 mL/min (by C-G formula based on SCr of 1.14 mg/dL). Liver Function Tests: Recent Labs  Lab 08/21/22 1100 08/22/22 0738  AST 15 10*  ALT 11 10  ALKPHOS 111 90  BILITOT 0.7 0.5  PROT 6.6 6.4*  ALBUMIN 3.2* 2.8*    No results for input(s): "LIPASE", "AMYLASE" in the last 168 hours. No results for input(s): "AMMONIA" in the last 168 hours. Coagulation Profile: No results for input(s): "INR", "PROTIME" in the last 168 hours. Cardiac Enzymes: No results for input(s): "CKTOTAL", "CKMB", "CKMBINDEX", "TROPONINI" in the last 168 hours. BNP (last 3  results) No results for input(s): "PROBNP" in the last 8760 hours. HbA1C: No results for input(s): "HGBA1C" in the last 72 hours. CBG: Recent Labs  Lab 08/22/22 0837 08/22/22 1138 08/22/22 1623 08/22/22 2133 08/23/22 0727  GLUCAP 113* 126* 122* 145* 102*    Lipid Profile: No results for input(s): "CHOL", "HDL", "LDLCALC", "TRIG", "CHOLHDL", "LDLDIRECT" in the last 72 hours. Thyroid Function Tests: No results for input(s): "TSH", "T4TOTAL", "  FREET4", "T3FREE", "THYROIDAB" in the last 72 hours. Anemia Panel: No results for input(s): "VITAMINB12", "FOLATE", "FERRITIN", "TIBC", "IRON", "RETICCTPCT" in the last 72 hours. Sepsis Labs: No results for input(s): "PROCALCITON", "LATICACIDVEN" in the last 168 hours.  No results found for this or any previous visit (from the past 240 hour(s)).   Radiology Studies: CT HEAD WO CONTRAST  Result Date: 08/21/2022 CLINICAL DATA:  Altered mental status.  Lethargy and brown emesis EXAM: CT HEAD WITHOUT CONTRAST TECHNIQUE: Contiguous axial images were obtained from the base of the skull through the vertex without intravenous contrast. RADIATION DOSE REDUCTION: This exam was performed according to the departmental dose-optimization program which includes automated exposure control, adjustment of the mA and/or kV according to patient size and/or use of iterative reconstruction technique. COMPARISON:  08/08/2022 FINDINGS: Brain: No evidence of acute infarction, hemorrhage, hydrocephalus, extra-axial collection or mass lesion/mass effect. Chronic bilateral cerebellar and left deep internal capsule infarcts. Chronic small vessel ischemia with patchy low-density in the cerebral white matter. Generalized atrophy with ventriculomegaly. There is callosum angle narrowing and upward bulging of the corpus callosum, a degree of communicating hydrocephalus is possible. Vascular: No hyperdense vessel or unexpected calcification. Skull: Normal. Negative for fracture or focal  lesion. Sinuses/Orbits: No acute finding. IMPRESSION: 1. No acute finding or change from 08/08/2022. 2. Chronic small vessel disease with chronic small vessel infarcts. 3. Ventriculomegaly with some features seen in normal pressure hydrocephalus. Electronically Signed   By: Jorje Guild M.D.   On: 08/21/2022 11:49    Scheduled Meds:  aspirin EC  81 mg Oral Daily   atorvastatin  20 mg Oral QPM   brimonidine  1 drop Both Eyes TID   dorzolamide-timolol  1 drop Both Eyes BID   enoxaparin (LOVENOX) injection  40 mg Subcutaneous Daily   insulin aspart  0-9 Units Subcutaneous TID WC   irbesartan  150 mg Oral Daily   levothyroxine  88 mcg Oral Q0600   memantine  10 mg Oral BID   pantoprazole  40 mg Oral Daily   sodium chloride flush  3 mL Intravenous Q12H   tamsulosin  0.4 mg Oral BID   Continuous Infusions:  cefTRIAXone (ROCEPHIN)  IV 2 g (08/23/22 0806)     LOS: 1 day   Darliss Cheney, MD [facility]  08/23/2022, 10:39 AM   *Please note that this is a verbal dictation therefore any spelling or grammatical errors are due to the "[employer]" system interpretation.  Please page via [place] and do not message via secure chat for urgent patient care matters. Secure chat can be used for non urgent patient care matters.  How to contact the [facility] Attending or Consulting provider [place] or covering provider during after hours [place], for this patient?  Check the care team in [facility] and look for a) attending/consulting TRH provider listed and b) the [facility] team listed. Page or secure chat [phone]. Log into [email or web address] and use [place]'s universal password to access. If you do not have the password, please contact the hospital operator. Locate the [facility] provider you are looking for under [facility] and page to a number that you can be directly reached. If you still have difficulty reaching the provider, please page the [facility] (Director on Call) for the Hospitalists listed on amion for  assistance.

## 2022-08-23 NOTE — Progress Notes (Signed)
   This pt is currently active with [facility] a home based Palliative Care program provided by [facility].   We will follow and assist with coordination of care at home once d/c.   Webb Silversmith RN [phone]

## 2022-08-24 ENCOUNTER — Other Ambulatory Visit (HOSPITAL_COMMUNITY): Payer: Self-pay

## 2022-08-24 DIAGNOSIS — N3000 Acute cystitis without hematuria: Secondary | ICD-10-CM | POA: Diagnosis not present

## 2022-08-24 LAB — CBC WITH DIFFERENTIAL/PLATELET
Abs Immature Granulocytes: 0.02 10*3/uL (ref 0.00–0.07)
Basophils Absolute: 0 10*3/uL (ref 0.0–0.1)
Basophils Relative: 1 %
Eosinophils Absolute: 0 10*3/uL (ref 0.0–0.5)
Eosinophils Relative: 1 %
HCT: 31 % — ABNORMAL LOW (ref 39.0–52.0)
Hemoglobin: 10 g/dL — ABNORMAL LOW (ref 13.0–17.0)
Immature Granulocytes: 0 %
Lymphocytes Relative: 36 %
Lymphs Abs: 2.1 10*3/uL (ref 0.7–4.0)
MCH: 29.9 pg (ref 26.0–34.0)
MCHC: 32.3 g/dL (ref 30.0–36.0)
MCV: 92.5 fL (ref 80.0–100.0)
Monocytes Absolute: 0.5 10*3/uL (ref 0.1–1.0)
Monocytes Relative: 9 %
Neutro Abs: 3.2 10*3/uL (ref 1.7–7.7)
Neutrophils Relative %: 53 %
Platelets: 233 10*3/uL (ref 150–400)
RBC: 3.35 MIL/uL — ABNORMAL LOW (ref 4.22–5.81)
RDW: 17.5 % — ABNORMAL HIGH (ref 11.5–15.5)
WBC: 5.9 10*3/uL (ref 4.0–10.5)
nRBC: 0 % (ref 0.0–0.2)

## 2022-08-24 LAB — BASIC METABOLIC PANEL
Anion gap: 6 (ref 5–15)
BUN: 16 mg/dL (ref 8–23)
CO2: 22 mmol/L (ref 22–32)
Calcium: 8.9 mg/dL (ref 8.9–10.3)
Chloride: 108 mmol/L (ref 98–111)
Creatinine, Ser: 1.22 mg/dL (ref 0.61–1.24)
GFR, Estimated: 58 mL/min — ABNORMAL LOW (ref >60)
Glucose, Bld: 156 mg/dL — ABNORMAL HIGH (ref 70–99)
Potassium: 4.2 mmol/L (ref 3.5–5.1)
Sodium: 136 mmol/L (ref 135–145)

## 2022-08-24 LAB — GLUCOSE, CAPILLARY
Glucose-Capillary: 151 mg/dL — ABNORMAL HIGH (ref 70–99)
Glucose-Capillary: 210 mg/dL — ABNORMAL HIGH (ref 70–99)

## 2022-08-24 MED ORDER — SULFAMETHOXAZOLE-TRIMETHOPRIM 800-160 MG PO TABS
1.0000 | ORAL_TABLET | Freq: Two times a day (BID) | ORAL | 0 refills | Status: AC
  Filled 2022-08-24: qty 10, 5d supply, fill #0

## 2022-08-24 NOTE — TOC Transition Note (Signed)
Transition of Care [facility]) - CM/SW Discharge Note   Patient Details  Name: Mike Mack MRN: [id number] Date of Birth: Sep 14, 1935  [facility] CM/SW Contact:  [name], Renea Ee, RN Phone Number: 08/24/2022, 10:49 AM   Clinical Narrative:     Patient is scheduled for discharge today. Readmission Prevention Assessment done. Patient active with [facility] and to resume outpatient at discharge.   Hospital f/u and discharge instructions on AVS.  Prescription sent to [facility] and meds to be delivered to patient at bedside prior to discharge.  PT scheduled for transportation. No further TOC needs noted.         Final next level of care: Home/Self Care Barriers to Discharge: Barriers Resolved   Patient Goals and CMS Choice CMS Medicare.gov Compare Post Acute Care list provided to:: Other (Comment Required) (Wife) Choice offered to / list presented to : NA  Discharge Placement                  Patient to be transferred to facility by: [facility]      Discharge Plan and Services Additional resources added to the After Visit Summary for                  DME Arranged: N/A DME Agency: NA       HH Arranged: NA [place] Agency: NA        Social Determinants of Health (SDOH) Interventions SDOH Screenings   Food Insecurity: No Food Insecurity (08/21/2022)  Housing: Low Risk  (08/21/2022)  Transportation Needs: No Transportation Needs (08/21/2022)  Utilities: Not At Risk (08/21/2022)  Tobacco Use: Low Risk  (08/21/2022)     Readmission Risk Interventions    08/24/2022   10:47 AM  Readmission Risk Prevention Plan  Transportation Screening Complete  PCP or Specialist Appt within 3-5 Days Complete  HRI or [place] Complete  Social Work Consult for [place] Planning/Counseling Complete  Palliative Care Screening Not Applicable  Medication Review Press photographer) Referral to Pharmacy

## 2022-08-24 NOTE — Consult Note (Signed)
   [facility] CM Inpatient Consult   08/24/2022  Mike Mack 01-19-1936 [id number]  Follow up:  [facility] High Risk readmission score, Readmission Prevention  Call placed to patient's wife, Mike Mack, HIPAA verified.  Explained [place] [facility] for support to PCP.   Wife states she has [name] palliative services and feels that she has what she needs currently for post hospital follow up needs.  Plan: Wife states no additional needs she felt at this time.  For questions,  [name], RN BSN [place]  [phone] business mobile phone Toll free office [phone]  *[place]  [phone] Fax number: [phone] [email or web address]

## 2022-08-24 NOTE — Discharge Summary (Addendum)
Physician Discharge Summary  Mike Mack [id number] DOB: 26-Sep-1935 DOA: 08/21/2022  PCP: [name], MD  Admit date: 08/21/2022 Discharge date: 08/24/2022 30 Day Unplanned Readmission Risk Score    Flowsheet Row ED to Hosp-Admission (Current) from 08/21/2022 in [facility] 27M KIDNEY UNIT  30 Day Unplanned Readmission Risk Score (%) 25.68 Filed at 08/24/2022 0801       This score is the patient's risk of an unplanned readmission within 30 days of being discharged (0 -100%). The score is based on dignosis, age, lab data, medications, orders, and past utilization.   Low:  0-14.9   Medium: 15-21.9   High: 22-29.9   Extreme: 30 and above          Admitted From: Home Disposition: Home  Recommendations for Outpatient Follow-up:  Follow up with PCP in 1-2 weeks Please obtain BMP/CBC in one week Please follow up with your PCP on the following pending results: Unresulted Labs (From admission, onward)     Start     Ordered   08/28/22 0500  Creatinine, serum  (enoxaparin (LOVENOX)    CrCl >/= 30 ml/min)  Weekly,   R     Comments: while on enoxaparin therapy    08/21/22 1257   08/21/22 1256  Urine Culture (for pregnant, neutropenic or urologic patients or patients with an indwelling urinary catheter)  (Urine Culture)  Add-on,   AD       Question:  Indication  Answer:  Altered mental status (if no other cause identified)   08/21/22 1257   08/21/22 0851  Rapid urine drug screen (hospital performed)  Once,   STAT        08/21/22 0850              Home Health: None Equipment/Devices: None  Discharge Condition: Stable CODE STATUS: DNR Diet recommendation: Cardiac  Subjective: Seen and examined.  He has no complaints.  He is alert and oriented, at his baseline.  Wife at the bedside who also confirms.  Discussed plan of discharge with the patient and the wife in detail, she is in agreement.  She verbalized understanding of the instructions as  well.  Brief/Interim Summary: Mike Mack is a 87 y.o. male with medical history significant of diabetes, stroke, dementia, polio, CKD 3A, BPH, anemia, glaucoma presented with altered mental status.   Patient has known history of dementia at baseline as above and at baseline also has lower extremity weakness right greater than left and is largely bedbound/wheelchair-bound.  Wife reports that since his discharge last month he was at baseline until the morning of admission.  She found him more difficult to awaken and he was less responsive.  Had intermittent decreases in responsiveness throughout the day.  He reported some dysuria.  Does have history of UTI and altered mental status most recently last month. Vital signs in the ED were fairly stable.  Chest x-ray showed minimal right basilar atelectasis and minimal pleural effusion.  CT head showed chronic changes that are stable as well as ventriculomegaly with some features that could be seen in normal pressure hydrocephalus however the ventriculomegaly is not new.  Patient was diagnosed with UTI and received IV Cipro in the ED. admitted under hospitalist service for following details.   Acute encephalopathy secondary to UTI: Patient was continued on Rocephin.  Unfortunately, urine was never collected for urine culture.  However patient has now remained alert and oriented x 2 which is his baseline, verified by the wife, for at least  the past 2 days.  Based on previous culture reports, I am discharging him on 5 more days of oral Bactrim DS.   History of stroke - Continue home atorvastatin and aspirin   Hypertension -Blood pressure controlled.  Resume home medications.   CKD 3A > Creatinine stable in the ED - Trend renal function and electrolytes   BPH/gross hematuria/acute on chronic anemia -Reportedly, patient had urethral trauma when he was placed Foley catheter in the ED, he had frank hematuria which has resolved now, urine is clear now.   Patient already has history of BPH and follows with Dr. Gloriann Loan every 6 months, according to wife. Patient's hemoglobin dropped slightly but he did not require any transfusion.  Current hemoglobin 10.  Patient's hematuria resolved.  Continue Flomax.   Diabetes melitis type II: -Last hemoglobin A1c 5.5 about 4 months ago,    Glaucoma - Continue home eyedrops   History of polio - Noted  Discharge plan was discussed with patient and/or family member and they verbalized understanding and agreed with it.  Discharge Diagnoses:  Principal Problem:   UTI (urinary tract infection) Active Problems:   CKD (chronic kidney disease) stage 3, GFR 30-59 ml/min (HCC)   Hypertension   BPH (benign prostatic hyperplasia)   Anemia   H/O: CVA (cerebrovascular accident)   H/O poliomyelitis   Type 2 diabetes mellitus ([place])   Dementia ([place])   Glaucoma   Acute encephalopathy    Discharge Instructions   Allergies as of 08/24/2022       Reactions   Metformin And Related Other (See Comments)   Gi intolerance   [place]a [insulin Glargine-lixisenatide] Other (See Comments)   Stomach cramps        Medication List     TAKE these medications    acetaminophen 325 MG tablet Commonly known as: TYLENOL Take 2 tablets (650 mg total) by mouth every 6 (six) hours as needed for mild pain (or Fever >/= 101).   amLODipine 2.5 MG tablet Commonly known as: NORVASC Take 1 tablet (2.5 mg total) by mouth daily.   aspirin EC 81 MG tablet Take 1 tablet (81 mg total) by mouth daily. Swallow whole.   atorvastatin 20 MG tablet Commonly known as: LIPITOR Take 20 mg by mouth every evening.   B COMPLEX PO Take 1 tablet by mouth at bedtime.   brimonidine 0.2 % ophthalmic solution Commonly known as: ALPHAGAN Place 1 drop into both eyes 3 (three) times daily.   cholecalciferol 10 MCG (400 UNIT) Tabs tablet Commonly known as: VITAMIN D3 Take 400 Units by mouth daily.   docusate sodium 100 MG capsule Commonly  known as: COLACE Take 1 capsule (100 mg total) by mouth 2 (two) times daily as needed for mild constipation.   dorzolamide-timolol 2-0.5 % ophthalmic solution Commonly known as: COSOPT Place 1 drop into both eyes 2 (two) times daily.   feeding supplement (GLUCERNA SHAKE) Liqd Take 237 mLs by mouth 3 (three) times daily between meals.   Finerenone 10 MG Tabs Take 1 tablet by mouth daily.   latanoprost 0.005 % ophthalmic solution Commonly known as: XALATAN Place 1 drop into both eyes at bedtime.   memantine 10 MG tablet Commonly known as: NAMENDA Take 10 mg by mouth in the morning and at bedtime.   mirtazapine 15 MG disintegrating tablet Commonly known as: REMERON SOL-TAB Take 1 tablet (15 mg total) by mouth at bedtime.   olmesartan 20 MG tablet Commonly known as: BENICAR Take 20 mg by mouth  daily.   pantoprazole 40 MG tablet Commonly known as: Protonix Take 1 tablet (40 mg total) by mouth daily.   polyethylene glycol 17 g packet Commonly known as: MIRALAX / GLYCOLAX Take 17 g by mouth daily. What changed:  when to take this reasons to take this   Rhopressa 0.02 % Soln Generic drug: Netarsudil Dimesylate Place 1 drop into both eyes at bedtime.   sulfamethoxazole-trimethoprim 800-160 MG tablet Commonly known as: BACTRIM DS Take 1 tablet by mouth 2 (two) times daily for 5 days.   Synthroid 88 MCG tablet Generic drug: levothyroxine Take 88 mcg by mouth daily before breakfast.   tamsulosin 0.4 MG Caps capsule Commonly known as: FLOMAX Take 0.4 mg by mouth in the morning and at bedtime.        Follow-up Information     [name], MD Follow up in 1 week(s).   Specialty: Internal Medicine Contact information: [place] [address] [phone]         Marton Redwood III, MD Follow up in 1 week(s).   Specialty: Urology Contact information: [place] [address] [phone]                 Allergies  Allergen Reactions   Metformin And Related Other (See Comments)    Gi intolerance   [name] [Insulin Glargine-Lixisenatide] Other (See Comments)    Stomach cramps    Consultations: None   Procedures/Studies: CT HEAD WO CONTRAST  Result Date: 08/21/2022 CLINICAL DATA:  Altered mental status.  Lethargy and brown emesis EXAM: CT HEAD WITHOUT CONTRAST TECHNIQUE: Contiguous axial images were obtained from the base of the skull through the vertex without intravenous contrast. RADIATION DOSE REDUCTION: This exam was performed according to the departmental dose-optimization program which includes automated exposure control, adjustment of the mA and/or kV according to patient size and/or use of iterative reconstruction technique. COMPARISON:  08/08/2022 FINDINGS: Brain: No evidence of acute infarction, hemorrhage, hydrocephalus, extra-axial collection or mass lesion/mass effect. Chronic bilateral cerebellar and left deep internal capsule infarcts. Chronic small vessel ischemia with patchy low-density in the cerebral white matter. Generalized atrophy with ventriculomegaly. There is callosum angle narrowing and upward bulging of the corpus callosum, a degree of communicating hydrocephalus is possible. Vascular: No hyperdense vessel or unexpected calcification. Skull: Normal. Negative for fracture or focal lesion. Sinuses/Orbits: No acute finding. IMPRESSION: 1. No acute finding or change from 08/08/2022. 2. Chronic small vessel disease with chronic small vessel infarcts. 3. Ventriculomegaly with some features seen in normal pressure hydrocephalus. Electronically Signed   By: Jorje Guild M.D.   On: 08/21/2022 11:49   DG Chest Port 1 View  Result Date: 08/21/2022 CLINICAL DATA:  Cough. EXAM: PORTABLE CHEST 1 VIEW COMPARISON:  August 08, 2022. FINDINGS: The heart size and mediastinal contours are within normal limits. Minimal right basilar subsegmental atelectasis is noted. Minimal  pleural effusions are noted. The visualized skeletal structures are unremarkable. IMPRESSION: Minimal right basilar subsegmental atelectasis. Minimal pleural effusions. Electronically Signed   By: Marijo Conception M.D.   On: 08/21/2022 09:38   CT HEAD CODE STROKE WO CONTRAST  Result Date: 08/08/2022 CLINICAL DATA:  Code stroke. EXAM: CT HEAD WITHOUT CONTRAST TECHNIQUE: Contiguous axial images were obtained from the base of the skull through the vertex without intravenous contrast. RADIATION DOSE REDUCTION: This exam was performed according to the departmental dose-optimization program which includes automated exposure control, adjustment of the mA and/or kV according to patient size  and/or use of iterative reconstruction technique. COMPARISON:  CT head 07/28/2022 FINDINGS: Brain: There is no evidence of acute intracranial hemorrhage, extra-axial fluid collection, or acute territorial infarct. There is unchanged prominence of the ventricular system out of proportion to the degree of sulcal widening. Multiple remote infarcts in the bilateral cerebellar hemispheres and background chronic small-vessel ischemic change are stable. The pituitary and suprasellar region are normal. There is no mass lesion. There is no mass effect or midline shift. Vascular: No hyperdense vessel or unexpected calcification. Skull: Normal. Negative for fracture or focal lesion. Sinuses/Orbits: The imaged paranasal sinuses are clear. Bilateral lens implants are in place. The globes and orbits are otherwise unremarkable. Other: A right mastoid effusion is unchanged. ASPECTS ([place] Stroke Program Early CT Score) - Ganglionic level infarction (caudate, lentiform nuclei, internal capsule, insula, M1-M3 cortex): 7 - Supraganglionic infarction (M4-M6 cortex): 3 Total score (0-10 with 10 being normal): 10 IMPRESSION: Stable noncontrast head CT with no acute intracranial pathology. Findings communicated to Dr. Curly Shores via Amion at 10:43 a.m.  Electronically Signed   By: Valetta Mole M.D.   On: 08/08/2022 10:47   DG Chest Portable 1 View  Result Date: 08/08/2022 CLINICAL DATA:  Eval infection EXAM: PORTABLE CHEST 1 VIEW COMPARISON:  Radiograph 07/08/2022 FINDINGS: Unchanged cardiomediastinal silhouette. There are small pleural effusions with bibasilar opacities. No pneumothorax. No acute osseous abnormality. IMPRESSION: Small pleural effusions with adjacent bibasilar opacities, could be atelectasis or infection. Electronically Signed   By: Maurine Simmering M.D.   On: 08/08/2022 10:26   CT Head Wo Contrast  Result Date: 07/28/2022 CLINICAL DATA:  Headache, sudden, severe. EXAM: CT HEAD WITHOUT CONTRAST TECHNIQUE: Contiguous axial images were obtained from the base of the skull through the vertex without intravenous contrast. RADIATION DOSE REDUCTION: This exam was performed according to the departmental dose-optimization program which includes automated exposure control, adjustment of the mA and/or kV according to patient size and/or use of iterative reconstruction technique. COMPARISON:  Head CT 04/01/2022. FINDINGS: Brain: No acute intracranial hemorrhage. Unchanged severe chronic small-vessel disease. Gray-white differentiation is otherwise preserved. Unchanged disproportionate enlargement of the lateral and third ventricles with acute callosal angle and elevated Evans index. No extra-axial collection. Vascular: No hyperdense vessel or unexpected calcification. Skull: No calvarial fracture or suspicious bone lesion. Skull base is unremarkable. Sinuses/Orbits: Unremarkable. Other: None. IMPRESSION: 1. No acute intracranial abnormality. 2. Unchanged disproportionate enlargement of the lateral and third ventricles with acute callosal angle and elevated Evans index. This can be seen with idiopathic normal pressure hydrocephalus in the appropriate clinical setting. 3. Unchanged severe chronic small-vessel disease. Electronically Signed   By: Emmit Alexanders M.D.   On: 07/28/2022 15:12     Discharge Exam: Vitals:   08/24/22 0439 08/24/22 0948  BP: (!) 169/82 (!) 117/55  Pulse: 89 90  Resp: 18 18  Temp: 99.6 F (37.6 C) 98.4 F (36.9 C)  SpO2: 100% 100%   Vitals:   08/23/22 1600 08/23/22 2103 08/24/22 0439 08/24/22 0948  BP: (!) 140/68 (!) 159/76 (!) 169/82 (!) 117/55  Pulse: 77 86 89 90  Resp: '18 15 18 18  '$ Temp: 98.6 F (37 C) 98 F (36.7 C) 99.6 F (37.6 C) 98.4 F (36.9 C)  TempSrc: Oral Oral Oral   SpO2: 100% 100% 100% 100%  Weight:      Height:        General: Pt is alert, awake, not in acute distress Cardiovascular: RRR, S1/S2 +, no rubs, no gallops Respiratory: CTA bilaterally, no  wheezing, no rhonchi Abdominal: Soft, NT, ND, bowel sounds + Extremities: no edema, no cyanosis    The results of significant diagnostics from this hospitalization (including imaging, microbiology, ancillary and laboratory) are listed below for reference.     Microbiology: No results found for this or any previous visit (from the past 240 hour(s)).   Labs: BNP (last 3 results) No results for input(s): "BNP" in the last 8760 hours. Basic Metabolic Panel: Recent Labs  Lab 08/21/22 1100 08/22/22 0738 08/23/22 0626 08/24/22 0436  NA 133* 139 138 136  K 4.4 4.1 4.0 4.2  CL 104 111 110 108  CO2 24 17* 22 22  GLUCOSE 156* 80 112* 156*  BUN '17 17 18 16  '$ CREATININE 1.23 1.26* 1.14 1.22  CALCIUM 9.1 8.7* 8.5* 8.9   Liver Function Tests: Recent Labs  Lab 08/21/22 1100 08/22/22 0738  AST 15 10*  ALT 11 10  ALKPHOS 111 90  BILITOT 0.7 0.5  PROT 6.6 6.4*  ALBUMIN 3.2* 2.8*   No results for input(s): "LIPASE", "AMYLASE" in the last 168 hours. No results for input(s): "AMMONIA" in the last 168 hours. CBC: Recent Labs  Lab 08/21/22 0850 08/22/22 0738 08/23/22 0626 08/24/22 0436  WBC 10.1 7.3 5.4 5.9  NEUTROABS 7.1  --  3.0 3.2  HGB 13.3 10.8* 9.8* 10.0*  HCT 40.6 33.8* 30.6* 31.0*  MCV 92.5 93.4 92.7 92.5   PLT 237 260 238 233   Cardiac Enzymes: No results for input(s): "CKTOTAL", "CKMB", "CKMBINDEX", "TROPONINI" in the last 168 hours. BNP: Invalid input(s): "POCBNP" CBG: Recent Labs  Lab 08/23/22 0727 08/23/22 1128 08/23/22 1616 08/23/22 2104 08/24/22 0723  GLUCAP 102* 193* 131* 187* 151*   D-Dimer No results for input(s): "DDIMER" in the last 72 hours. Hgb A1c No results for input(s): "HGBA1C" in the last 72 hours. Lipid Profile No results for input(s): "CHOL", "HDL", "LDLCALC", "TRIG", "CHOLHDL", "LDLDIRECT" in the last 72 hours. Thyroid function studies No results for input(s): "TSH", "T4TOTAL", "T3FREE", "THYROIDAB" in the last 72 hours.  Invalid input(s): "FREET3" Anemia work up No results for input(s): "VITAMINB12", "FOLATE", "FERRITIN", "TIBC", "IRON", "RETICCTPCT" in the last 72 hours. Urinalysis    Component Value Date/Time   COLORURINE YELLOW 08/21/2022 1028   APPEARANCEUR HAZY (A) 08/21/2022 1028   LABSPEC 1.018 08/21/2022 1028   PHURINE 5.0 08/21/2022 1028   GLUCOSEU NEGATIVE 08/21/2022 1028   HGBUR LARGE (A) 08/21/2022 1028   BILIRUBINUR NEGATIVE 08/21/2022 1028   KETONESUR NEGATIVE 08/21/2022 1028   PROTEINUR 30 (A) 08/21/2022 1028   UROBILINOGEN 0.2 10/25/2014 1520   NITRITE NEGATIVE 08/21/2022 1028   LEUKOCYTESUR LARGE (A) 08/21/2022 1028   Sepsis Labs Recent Labs  Lab 08/21/22 0850 08/22/22 0738 08/23/22 0626 08/24/22 0436  WBC 10.1 7.3 5.4 5.9   Microbiology No results found for this or any previous visit (from the past 240 hour(s)).   Time coordinating discharge: Over 30 minutes  SIGNED:   Darliss Cheney, MD  [facility] 08/24/2022, 9:49 AM *Please note that this is a verbal dictation therefore any spelling or grammatical errors are due to the "[employer]" system interpretation. If 7PM-7AM, please contact night-coverage [email or web address]

## 2022-08-24 NOTE — Progress Notes (Addendum)
TOC med delivered to pt's room, given to pt's wife, pt's RN at bedside. Pt waiting for [facility] for transport home.  Erinne Gillentine,RN SWOT

## 2022-08-24 NOTE — Progress Notes (Signed)
RN messaged provider that pt has in condom cath and not Foley cath as was understood by provider

## 2022-08-29 ENCOUNTER — Encounter (HOSPITAL_COMMUNITY): Payer: Self-pay | Admitting: Internal Medicine

## 2022-08-29 ENCOUNTER — Emergency Department (HOSPITAL_COMMUNITY): Payer: Medicare HMO

## 2022-08-29 ENCOUNTER — Other Ambulatory Visit: Payer: Self-pay

## 2022-08-29 ENCOUNTER — Inpatient Hospital Stay (HOSPITAL_COMMUNITY)
Admission: EM | Admit: 2022-08-29 | Discharge: 2022-09-05 | DRG: 683 | Disposition: A | Discharge status: Hospice | Payer: Medicare HMO | Attending: Internal Medicine | Admitting: Internal Medicine

## 2022-08-29 DIAGNOSIS — Z1624 Resistance to multiple antibiotics: Secondary | ICD-10-CM | POA: Diagnosis present

## 2022-08-29 DIAGNOSIS — H409 Unspecified glaucoma: Secondary | ICD-10-CM | POA: Diagnosis not present

## 2022-08-29 DIAGNOSIS — N390 Urinary tract infection, site not specified: Secondary | ICD-10-CM | POA: Diagnosis not present

## 2022-08-29 DIAGNOSIS — I10 Essential (primary) hypertension: Secondary | ICD-10-CM | POA: Diagnosis not present

## 2022-08-29 DIAGNOSIS — Z1152 Encounter for screening for COVID-19: Secondary | ICD-10-CM

## 2022-08-29 DIAGNOSIS — Z961 Presence of intraocular lens: Secondary | ICD-10-CM | POA: Diagnosis present

## 2022-08-29 DIAGNOSIS — Z8673 Personal history of transient ischemic attack (TIA), and cerebral infarction without residual deficits: Secondary | ICD-10-CM

## 2022-08-29 DIAGNOSIS — G629 Polyneuropathy, unspecified: Secondary | ICD-10-CM | POA: Diagnosis present

## 2022-08-29 DIAGNOSIS — E119 Type 2 diabetes mellitus without complications: Secondary | ICD-10-CM | POA: Diagnosis not present

## 2022-08-29 DIAGNOSIS — N12 Tubulo-interstitial nephritis, not specified as acute or chronic: Secondary | ICD-10-CM | POA: Diagnosis present

## 2022-08-29 DIAGNOSIS — E861 Hypovolemia: Secondary | ICD-10-CM | POA: Diagnosis present

## 2022-08-29 DIAGNOSIS — N1831 Chronic kidney disease, stage 3a: Secondary | ICD-10-CM | POA: Diagnosis present

## 2022-08-29 DIAGNOSIS — B9689 Other specified bacterial agents as the cause of diseases classified elsewhere: Secondary | ICD-10-CM | POA: Diagnosis not present

## 2022-08-29 DIAGNOSIS — F03C11 Unspecified dementia, severe, with agitation: Secondary | ICD-10-CM | POA: Diagnosis not present

## 2022-08-29 DIAGNOSIS — Z1612 Extended spectrum beta lactamase (ESBL) resistance: Secondary | ICD-10-CM | POA: Diagnosis present

## 2022-08-29 DIAGNOSIS — R531 Weakness: Secondary | ICD-10-CM | POA: Diagnosis not present

## 2022-08-29 DIAGNOSIS — E1142 Type 2 diabetes mellitus with diabetic polyneuropathy: Secondary | ICD-10-CM | POA: Diagnosis not present

## 2022-08-29 DIAGNOSIS — I959 Hypotension, unspecified: Secondary | ICD-10-CM | POA: Diagnosis not present

## 2022-08-29 DIAGNOSIS — Z79899 Other long term (current) drug therapy: Secondary | ICD-10-CM

## 2022-08-29 DIAGNOSIS — Z8612 Personal history of poliomyelitis: Secondary | ICD-10-CM

## 2022-08-29 DIAGNOSIS — K5900 Constipation, unspecified: Secondary | ICD-10-CM | POA: Diagnosis present

## 2022-08-29 DIAGNOSIS — B961 Klebsiella pneumoniae [K. pneumoniae] as the cause of diseases classified elsewhere: Secondary | ICD-10-CM | POA: Diagnosis not present

## 2022-08-29 DIAGNOSIS — F0390 Unspecified dementia without behavioral disturbance: Secondary | ICD-10-CM | POA: Diagnosis present

## 2022-08-29 DIAGNOSIS — E1122 Type 2 diabetes mellitus with diabetic chronic kidney disease: Secondary | ICD-10-CM | POA: Diagnosis not present

## 2022-08-29 DIAGNOSIS — E860 Dehydration: Secondary | ICD-10-CM | POA: Diagnosis present

## 2022-08-29 DIAGNOSIS — J90 Pleural effusion, not elsewhere classified: Secondary | ICD-10-CM | POA: Diagnosis present

## 2022-08-29 DIAGNOSIS — N400 Enlarged prostate without lower urinary tract symptoms: Secondary | ICD-10-CM | POA: Diagnosis present

## 2022-08-29 DIAGNOSIS — N281 Cyst of kidney, acquired: Secondary | ICD-10-CM | POA: Diagnosis not present

## 2022-08-29 DIAGNOSIS — X58XXXA Exposure to other specified factors, initial encounter: Secondary | ICD-10-CM | POA: Diagnosis present

## 2022-08-29 DIAGNOSIS — Z8249 Family history of ischemic heart disease and other diseases of the circulatory system: Secondary | ICD-10-CM

## 2022-08-29 DIAGNOSIS — S90413A Abrasion, unspecified great toe, initial encounter: Secondary | ICD-10-CM | POA: Diagnosis present

## 2022-08-29 DIAGNOSIS — R1319 Other dysphagia: Secondary | ICD-10-CM | POA: Diagnosis not present

## 2022-08-29 DIAGNOSIS — I700 Atherosclerosis of aorta: Secondary | ICD-10-CM | POA: Diagnosis present

## 2022-08-29 DIAGNOSIS — G9340 Encephalopathy, unspecified: Secondary | ICD-10-CM | POA: Diagnosis not present

## 2022-08-29 DIAGNOSIS — Z9842 Cataract extraction status, left eye: Secondary | ICD-10-CM

## 2022-08-29 DIAGNOSIS — Z7401 Bed confinement status: Secondary | ICD-10-CM | POA: Diagnosis not present

## 2022-08-29 DIAGNOSIS — N179 Acute kidney failure, unspecified: Principal | ICD-10-CM | POA: Diagnosis present

## 2022-08-29 DIAGNOSIS — R109 Unspecified abdominal pain: Secondary | ICD-10-CM | POA: Diagnosis not present

## 2022-08-29 DIAGNOSIS — Z66 Do not resuscitate: Secondary | ICD-10-CM | POA: Diagnosis present

## 2022-08-29 DIAGNOSIS — Z87442 Personal history of urinary calculi: Secondary | ICD-10-CM

## 2022-08-29 DIAGNOSIS — E039 Hypothyroidism, unspecified: Secondary | ICD-10-CM | POA: Diagnosis present

## 2022-08-29 DIAGNOSIS — I129 Hypertensive chronic kidney disease with stage 1 through stage 4 chronic kidney disease, or unspecified chronic kidney disease: Secondary | ICD-10-CM | POA: Diagnosis present

## 2022-08-29 DIAGNOSIS — Z833 Family history of diabetes mellitus: Secondary | ICD-10-CM

## 2022-08-29 DIAGNOSIS — Z8711 Personal history of peptic ulcer disease: Secondary | ICD-10-CM

## 2022-08-29 DIAGNOSIS — Z7982 Long term (current) use of aspirin: Secondary | ICD-10-CM

## 2022-08-29 DIAGNOSIS — Z79890 Hormone replacement therapy (postmenopausal): Secondary | ICD-10-CM

## 2022-08-29 DIAGNOSIS — J9811 Atelectasis: Secondary | ICD-10-CM | POA: Diagnosis not present

## 2022-08-29 DIAGNOSIS — Z9841 Cataract extraction status, right eye: Secondary | ICD-10-CM

## 2022-08-29 LAB — COMPREHENSIVE METABOLIC PANEL
ALT: 13 U/L (ref 0–44)
AST: 13 U/L — ABNORMAL LOW (ref 15–41)
Albumin: 3.6 g/dL (ref 3.5–5.0)
Alkaline Phosphatase: 105 U/L (ref 38–126)
Anion gap: 7 (ref 5–15)
BUN: 17 mg/dL (ref 8–23)
CO2: 24 mmol/L (ref 22–32)
Calcium: 9 mg/dL (ref 8.9–10.3)
Chloride: 106 mmol/L (ref 98–111)
Creatinine, Ser: 1.61 mg/dL — ABNORMAL HIGH (ref 0.61–1.24)
GFR, Estimated: 42 mL/min — ABNORMAL LOW (ref >60)
Glucose, Bld: 174 mg/dL — ABNORMAL HIGH (ref 70–99)
Potassium: 4.9 mmol/L (ref 3.5–5.1)
Sodium: 137 mmol/L (ref 135–145)
Total Bilirubin: 0.5 mg/dL (ref 0.3–1.2)
Total Protein: 7.3 g/dL (ref 6.5–8.1)

## 2022-08-29 LAB — CBC WITH DIFFERENTIAL/PLATELET
Abs Immature Granulocytes: 0.02 10*3/uL (ref 0.00–0.07)
Basophils Absolute: 0 10*3/uL (ref 0.0–0.1)
Basophils Relative: 1 %
Eosinophils Absolute: 0.1 10*3/uL (ref 0.0–0.5)
Eosinophils Relative: 1 %
HCT: 36.6 % — ABNORMAL LOW (ref 39.0–52.0)
Hemoglobin: 11.8 g/dL — ABNORMAL LOW (ref 13.0–17.0)
Immature Granulocytes: 0 %
Lymphocytes Relative: 33 %
Lymphs Abs: 2.3 10*3/uL (ref 0.7–4.0)
MCH: 30.2 pg (ref 26.0–34.0)
MCHC: 32.2 g/dL (ref 30.0–36.0)
MCV: 93.6 fL (ref 80.0–100.0)
Monocytes Absolute: 0.5 10*3/uL (ref 0.1–1.0)
Monocytes Relative: 7 %
Neutro Abs: 4.1 10*3/uL (ref 1.7–7.7)
Neutrophils Relative %: 58 %
Platelets: 271 10*3/uL (ref 150–400)
RBC: 3.91 MIL/uL — ABNORMAL LOW (ref 4.22–5.81)
RDW: 17.2 % — ABNORMAL HIGH (ref 11.5–15.5)
WBC: 6.9 10*3/uL (ref 4.0–10.5)
nRBC: 0 % (ref 0.0–0.2)

## 2022-08-29 LAB — URINALYSIS, W/ REFLEX TO CULTURE (INFECTION SUSPECTED)
Bacteria, UA: NONE SEEN
Bilirubin Urine: NEGATIVE
Glucose, UA: NEGATIVE mg/dL
Ketones, ur: NEGATIVE mg/dL
Nitrite: NEGATIVE
Protein, ur: NEGATIVE mg/dL
RBC / HPF: >50 RBC/hpf (ref 0–5)
Specific Gravity, Urine: 1.01 (ref 1.005–1.030)
WBC, UA: >50 WBC/hpf (ref 0–5)
pH: 7 (ref 5.0–8.0)

## 2022-08-29 LAB — TROPONIN I (HIGH SENSITIVITY): Troponin I (High Sensitivity): 4 ng/L (ref <18)

## 2022-08-29 LAB — CK: Total CK: 46 U/L — ABNORMAL LOW (ref 49–397)

## 2022-08-29 LAB — RESP PANEL BY RT-PCR (RSV, FLU A&B, COVID)  RVPGX2
Influenza A by PCR: NEGATIVE
Influenza B by PCR: NEGATIVE
Resp Syncytial Virus by PCR: NEGATIVE
SARS Coronavirus 2 by RT PCR: NEGATIVE

## 2022-08-29 LAB — LACTIC ACID, PLASMA: Lactic Acid, Venous: 1.7 mmol/L (ref 0.5–1.9)

## 2022-08-29 LAB — CBG MONITORING, ED: Glucose-Capillary: 106 mg/dL — ABNORMAL HIGH (ref 70–99)

## 2022-08-29 LAB — LIPASE, BLOOD: Lipase: 32 U/L (ref 11–51)

## 2022-08-29 MED ORDER — PANTOPRAZOLE SODIUM 40 MG PO TBEC
40.0000 mg | DELAYED_RELEASE_TABLET | Freq: Every day | ORAL | Status: DC
  Administered 2022-08-30 – 2022-09-05 (×7): 40 mg via ORAL
  Filled 2022-08-29 (×7): qty 1

## 2022-08-29 MED ORDER — SODIUM CHLORIDE 0.9 % IV SOLN: INTRAVENOUS | Status: AC

## 2022-08-29 MED ORDER — SODIUM CHLORIDE 0.9 % IV SOLN
1.0000 g | INTRAVENOUS | Status: DC
  Administered 2022-08-29: 1 g via INTRAVENOUS
  Filled 2022-08-29: qty 10

## 2022-08-29 MED ORDER — HYDRALAZINE HCL 20 MG/ML IJ SOLN: 5.0000 mg | Freq: Four times a day (QID) | INTRAMUSCULAR | Status: DC | PRN

## 2022-08-29 MED ORDER — ACETAMINOPHEN 325 MG PO TABS
650.0000 mg | ORAL_TABLET | Freq: Four times a day (QID) | ORAL | Status: DC | PRN
  Administered 2022-09-01 – 2022-09-04 (×3): 650 mg via ORAL
  Filled 2022-08-29 (×3): qty 2

## 2022-08-29 MED ORDER — ATORVASTATIN CALCIUM 20 MG PO TABS
20.0000 mg | ORAL_TABLET | Freq: Every evening | ORAL | Status: DC
  Administered 2022-08-30 – 2022-09-04 (×6): 20 mg via ORAL
  Filled 2022-08-29 (×6): qty 1

## 2022-08-29 MED ORDER — INSULIN ASPART 100 UNIT/ML IJ SOLN
0.0000 [IU] | Freq: Every day | INTRAMUSCULAR | Status: DC
  Filled 2022-08-29: qty 0.05

## 2022-08-29 MED ORDER — TAMSULOSIN HCL 0.4 MG PO CAPS
0.4000 mg | ORAL_CAPSULE | Freq: Every day | ORAL | Status: DC
  Administered 2022-08-29 – 2022-09-04 (×7): 0.4 mg via ORAL
  Filled 2022-08-29 (×7): qty 1

## 2022-08-29 MED ORDER — ACETAMINOPHEN 650 MG RE SUPP: 650.0000 mg | Freq: Four times a day (QID) | RECTAL | Status: DC | PRN

## 2022-08-29 MED ORDER — INSULIN ASPART 100 UNIT/ML IJ SOLN
0.0000 [IU] | Freq: Three times a day (TID) | INTRAMUSCULAR | Status: DC
  Administered 2022-09-01: 2 [IU] via SUBCUTANEOUS
  Administered 2022-09-02 – 2022-09-05 (×3): 1 [IU] via SUBCUTANEOUS
  Filled 2022-08-29: qty 0.06

## 2022-08-29 MED ORDER — MIRTAZAPINE 15 MG PO TBDP
15.0000 mg | ORAL_TABLET | Freq: Every day | ORAL | Status: DC
  Administered 2022-08-29 – 2022-09-04 (×7): 15 mg via ORAL
  Filled 2022-08-29 (×8): qty 1

## 2022-08-29 MED ORDER — LEVOTHYROXINE SODIUM 88 MCG PO TABS
88.0000 ug | ORAL_TABLET | Freq: Every day | ORAL | Status: DC
  Administered 2022-08-30 – 2022-09-05 (×7): 88 ug via ORAL
  Filled 2022-08-29 (×7): qty 1

## 2022-08-29 MED ORDER — MEMANTINE HCL 10 MG PO TABS
10.0000 mg | ORAL_TABLET | Freq: Two times a day (BID) | ORAL | Status: DC
  Administered 2022-08-29 – 2022-09-05 (×13): 10 mg via ORAL
  Filled 2022-08-29 (×3): qty 1
  Filled 2022-08-29: qty 2
  Filled 2022-08-29 (×9): qty 1

## 2022-08-29 MED ORDER — SODIUM CHLORIDE 0.9 % IV BOLUS
1000.0000 mL | Freq: Once | INTRAVENOUS | Status: AC
  Administered 2022-08-29: 1000 mL via INTRAVENOUS

## 2022-08-29 MED ORDER — BACITRACIN-NEOMYCIN-POLYMYXIN OINTMENT TUBE: TOPICAL_OINTMENT | Freq: Two times a day (BID) | CUTANEOUS | Status: DC

## 2022-08-29 MED ORDER — ASPIRIN 81 MG PO TBEC
81.0000 mg | DELAYED_RELEASE_TABLET | Freq: Every day | ORAL | Status: DC
  Administered 2022-08-30 – 2022-09-05 (×7): 81 mg via ORAL
  Filled 2022-08-29 (×7): qty 1

## 2022-08-29 MED ORDER — ENOXAPARIN SODIUM 30 MG/0.3ML IJ SOSY
30.0000 mg | PREFILLED_SYRINGE | INTRAMUSCULAR | Status: DC
  Administered 2022-08-29 – 2022-08-30 (×2): 30 mg via SUBCUTANEOUS
  Filled 2022-08-29 (×2): qty 0.3

## 2022-08-29 NOTE — H&P (Signed)
History and Physical    Patient: Mike Mack [id number] DOB: 10/22/1935 DOA: 08/29/2022 DOS: the patient was seen and examined on 08/29/2022 PCP: [name], MD  Patient coming from: Home  Chief Complaint:  Chief Complaint  Patient presents with   Hypotension   HPI: Mike Mack is a 87 y.o. male with medical history significant of remote polio,  hypertension, Dm2, neuropathy,  h/o CVA, dementia, anemia, bilateral pleural effusions, BPH presents with  hypotension, sbp 70's while at [place] earlier today. Pt was told to go to ER for evaluation.  Pt  is unable to provide meaningful history due to dementia.  His wife notes that he was recently diagnosed with acute lower UTI and started on Bactrim on 08/25/22.  She notes he has not complained of any fever, chills, cough, cp, palp, sob, n/v, abd pain, diarrhea, brbpr, black stool, hematuria.  He may or may not have had some c/o burning with urination.  She also notes that his right great toe has a skin abrasion.,   In ED, T 98.4, P 74  R 18, Bp 81/56  pox 100% on RA  Wbc 6.9, Hgb 11.8, Plt 271 Na 137, K 4.9, Bun 17, Creat 1.61, Glucose 174 Ast 13, Alt 13 Lactic acid 1.7 Blood culture x2 pending Urinalysis wbc >50, rbc >50   CXR  IMPRESSION: 1. Unchanged small bilateral pleural effusions and bibasilar atelectasis. 2. Gastric gaseous distention, new from prior exam.   CT abd/ pelvis  IMPRESSION: 1. No acute finding to explain the clinical presentation. No evidence of urinary tract stone disease or hydronephrosis. Small kidneys with simple appearing cysts that do not require specific follow-up. 2. Chronic bilateral pleural effusions larger on the right than the left with mild dependent atelectasis. Elevation of the left hemidiaphragm. 3. Large amount of fecal matter throughout the colon which could be symptomatic. No evidence of diverticulitis or other acute bowel finding. 4. Aortic atherosclerosis. 5. Markedly  enlarged prostate gland. 6. Pronounced fatty atrophy of the right gluteal and leg muscles. No apparent spinal etiology as seen.  Pt will be admitted for AKI, hypotension and acute lower uti.    Review of Systems: negative for all 10 organ systems except for + above   Past Medical History:  Diagnosis Date   Acute metabolic encephalopathy [id number]   Anemia 05/13/2013   Arthritis    "right leg" (07/25/2013)   BPH (benign prostatic hyperplasia)    Cholecystitis, acute 05/24/2013   Constipation 05/13/2013   Exertional shortness of breath    "sometimes" (07/25/2013)   History of kidney stones    History of stomach ulcers    Hypertension    Hypothermia 04/01/2022   Neuropathy    Polio    Polio Childhood   Small bowel obstruction ([place])    Stroke ([place]) 2014   residual:  "left hand kind of numb" (07/25/2013)   Type II diabetes mellitus ([place])    Past Surgical History:  Procedure Laterality Date   BIOPSY  11/30/2020   Procedure: BIOPSY;  Surgeon: Ronnette Juniper, MD;  Location: [facility];  Service: Gastroenterology;;   BIOPSY  03/03/2022   Procedure: BIOPSY;  Surgeon: Clarene Essex, MD;  Location: [facility];  Service: Gastroenterology;;   BOWEL RESECTION     CATARACT EXTRACTION W/ INTRAOCULAR LENS  IMPLANT, BILATERAL Bilateral 2000's   CHOLECYSTECTOMY N/A 07/25/2013   Procedure: LAPAROSCOPIC CHOLECYSTECTOMY WITH INTRAOPERATIVE CHOLANGIOGRAM;  Surgeon: [name], MD;  Location: [facility];  Service: General;  Laterality: N/A;  COLON SURGERY     COLONOSCOPY     Hx: of   CYSTOSCOPY WITH BIOPSY N/A 12/01/2019   Procedure: CYSTOSCOPY WITH BLADDER BIOPSY AND FULGURATION;  Surgeon: Lucas Mallow, MD;  Location: [facility];  Service: Urology;  Laterality: N/A;   ESOPHAGOGASTRODUODENOSCOPY N/A 11/30/2020   Procedure: ESOPHAGOGASTRODUODENOSCOPY (EGD);  Surgeon: Ronnette Juniper, MD;  Location: [facility];  Service: Gastroenterology;  Laterality: N/A;   ESOPHAGOGASTRODUODENOSCOPY N/A 03/03/2022    Procedure: ESOPHAGOGASTRODUODENOSCOPY (EGD);  Surgeon: Clarene Essex, MD;  Location: [facility];  Service: Gastroenterology;  Laterality: N/A;   EXCISIONAL HEMORRHOIDECTOMY  2000's   EXTRACORPOREAL SHOCK WAVE LITHOTRIPSY Right 10/03/2018   Procedure: EXTRACORPOREAL SHOCK WAVE LITHOTRIPSY (ESWL);  Surgeon: Festus Aloe, MD;  Location: [facility];  Service: Urology;  Laterality: Right;   EYE SURGERY     INGUINAL HERNIA REPAIR Right 1970's   IR RADIOLOGIST EVAL & MGMT  04/06/2020   LAPAROSCOPIC CHOLECYSTECTOMY  07/25/2013   w/LOA (07/25/2013)   Social History:  reports that he has never smoked. He has never used smokeless tobacco. He reports that he does not drink alcohol and does not use drugs.  Allergies  Allergen Reactions   Insulin Glargine-Lixisenatide Other (See Comments) and Nausea And Vomiting    Stomach cramps   Metformin Nausea And Vomiting   Metformin And Related Other (See Comments)    Gi intolerance   Nsaids Other (See Comments)    Renal insufficiency    Family History  Problem Relation Age of Onset   Heart failure Mother    Hypertension Sister    Diabetes Sister    Diabetes Brother    Hypertension Brother     Prior to Admission medications   Medication Sig Start Date End Date Taking? Authorizing Provider  acetaminophen (TYLENOL) 325 MG tablet Take 2 tablets (650 mg total) by mouth every 6 (six) hours as needed for mild pain (or Fever >/= 101). 11/27/20   Debbe Odea, MD  amLODipine (NORVASC) 2.5 MG tablet Take 1 tablet (2.5 mg total) by mouth daily. 08/11/22   Little Ishikawa, MD  aspirin EC 81 MG tablet Take 1 tablet (81 mg total) by mouth daily. Swallow whole. 04/06/22   Little Ishikawa, MD  atorvastatin (LIPITOR) 20 MG tablet Take 20 mg by mouth every evening.  11/03/13   [provider]  B Complex Vitamins (B COMPLEX PO) Take 1 tablet by mouth at bedtime.    [provider]  brimonidine (ALPHAGAN) 0.2 % ophthalmic solution Place 1 drop into  both eyes 3 (three) times daily.    [provider]  cholecalciferol (VITAMIN D3) 10 MCG (400 UNIT) TABS tablet Take 400 Units by mouth daily.    [provider]  docusate sodium (COLACE) 100 MG capsule Take 1 capsule (100 mg total) by mouth 2 (two) times daily as needed for mild constipation. Patient not taking: Reported on 08/08/2022 03/09/22   Geradine Girt, DO  dorzolamide-timolol (COSOPT) 22.3-6.8 MG/ML ophthalmic solution Place 1 drop into both eyes 2 (two) times daily. 03/04/20   [provider]  feeding supplement, GLUCERNA SHAKE, (GLUCERNA SHAKE) LIQD Take 237 mLs by mouth 3 (three) times daily between meals. Patient not taking: Reported on 08/08/2022 03/25/20   Antonieta Pert, MD  Finerenone 10 MG TABS Take 1 tablet by mouth daily.    [provider]  latanoprost (XALATAN) 0.005 % ophthalmic solution Place 1 drop into both eyes at bedtime.    [provider]  levothyroxine (SYNTHROID) 88  MCG tablet Take 88 mcg by mouth daily before breakfast.    [provider]  memantine (NAMENDA) 10 MG tablet Take 10 mg by mouth in the morning and at bedtime. 11/04/20   [provider]  mirtazapine (REMERON SOL-TAB) 15 MG disintegrating tablet Take 1 tablet (15 mg total) by mouth at bedtime. 03/09/22   Geradine Girt, DO  olmesartan (BENICAR) 20 MG tablet Take 20 mg by mouth daily.    [provider]  pantoprazole (PROTONIX) 40 MG tablet Take 1 tablet (40 mg total) by mouth daily. 07/08/22   Dion Saucier A, PA  polyethylene glycol (MIRALAX / GLYCOLAX) packet Take 17 g by mouth daily. Patient taking differently: Take 17 g by mouth daily as needed for mild constipation. 09/06/13   [name], MD  RHOPRESSA 0.02 % SOLN Place 1 drop into both eyes at bedtime. 03/05/20   [provider]  sulfamethoxazole-trimethoprim (BACTRIM DS) 800-160 MG tablet Take 1 tablet by mouth 2 (two) times daily for 5 days. 08/24/22 08/29/22  Darliss Cheney, MD  Tamsulosin HCl (FLOMAX) 0.4 MG CAPS Take 0.4 mg by mouth in the morning and at bedtime.    [provider]    Physical Exam: Vitals:   08/29/22 1815 08/29/22 1830 08/29/22 1843 08/29/22 1845  BP: (!) 156/78 (!) 155/130  (!) 155/78  Pulse: 79 77  78  Resp: '13 16  15  '$ Temp:   98.8 F (37.1 C)   TempSrc:   Oral   SpO2: 100% 97%  97%   Heent: anicteric, tongue dry Neck: no jvd, no bruit, no tm Heart: rrr s1, s2, no m/g/r Lung: slight decrease bs at bilateral base, no crackles no wheezing Abd: soft, nt, nd, +bs Ext: no c/c/e Skin: 1x43m skin abrasion to the tip of the 1st toe right foot Onychomycosis, dry skin on feet, hammer toes  Data Reviewed:  Assessment and Plan: AKI ddx dehydration,  hypotension (ATN) vs Bactrim (competes with creat for excretion and can cause interstitial nephritis) STOP Losartan Hydrate with ns iv Check bmp in am  Hypotension likely dehydration Hold Bp medications, please review MAR and consider restarting home bp medications in am Check cortisol Check trop I  Consider check cardiac echo if persistent  Acute lower uti Urine culture pending Blood culture pending DC Bactrim Start on Rocephin 1gm iv qday  Hypertension Hold bp medications, please review MAR and consider restarting home bp medication in am Hydralazine '5mg'$  iv q6h prn sbp >160 Dementia Cont Namenda '10mg'$  po bid Cont Remeron '15mg'$  po qhs  Hypothyroidism Cont levothyroxine 88 micrograms po qday  Glaucoma Please verify home eye drops in am  Bph Cont Flomax  Skin abrasion 1st toe R foot Wound care Abx cream     Advance Care Planning:   Code Status: Prior DNR/DNI, d/w wife  Consults: none  Family Communication: spoke w ith wife who provided history  Severity of Illness: The appropriate patient status for this patient is OBSERVATION. Observation status is judged to be reasonable and necessary in order to provide the required intensity of service to  ensure the patient's safety. The patient's presenting symptoms, physical exam findings, and initial radiographic and laboratory data in the context of their medical condition is felt to place them at decreased risk for further clinical deterioration. Furthermore, it is anticipated that the patient will be medically stable for discharge from the hospital within 2 midnights of admission.   Author: JJani Gravel MD 08/29/2022 7:20 PM  For on call review [email or web address].

## 2022-08-29 NOTE — ED Provider Notes (Signed)
  Physical Exam  BP (!) 156/78   Pulse 79   Temp 98.8 F (37.1 C) (Oral)   Resp 13   SpO2 100%   Physical Exam  Procedures  Procedures  ED Course / MDM    Medical Decision Making Care assumed at 4 PM.  Patient is here with hypotension.  Patient has recent admission for urinary retention and UTI and is on last dose of Bactrim tonight.  Patient went to GI clinic and was noted to be hypotensive.  Sepsis workup initiated.  Signed out pending urinalysis and reassessment  6:51 PM Patient has AKI with creatinine 1.6.  Patient appears dehydrated.  Blood pressure improved to 150s after IV fluids.  CT showed some constipation but no obvious hydronephrosis or pyelonephritis.  UA showed no bacteria and no obvious signs of UTI.  At this point, patient will be admitted for observation for AKI.  Problems Addressed: AKI (acute kidney injury) ([place]): acute illness or injury Hypotension, unspecified hypotension type: acute illness or injury  Amount and/or Complexity of Data Reviewed Labs: ordered. Decision-making details documented in ED Course. Radiology: ordered and independent interpretation performed. Decision-making details documented in ED Course.  Risk Decision regarding hospitalization.          Drenda Freeze, MD 08/29/22 [phone]

## 2022-08-29 NOTE — ED Provider Notes (Signed)
Mike Mack Provider Note   CSN: [id number] Arrival date & time: 08/29/22  1348     History  Chief Complaint  Patient presents with   Hypotension    Mike Mack is a 87 y.o. male.  87 year old male with prior medical history as detailed below presents for evaluation.  Patient apparently was at GI clinic.  Patient was noted to be hypotensive with systolic in the [id number].  Patient was referred to the ED for emergent evaluation.  Patient's family refused EMS transport.  They transported the patient here by private vehicle.  On arrival to the ED the patient reports that he feels fine.  He denies any specific complaint.  Initial blood pressure is 84 systolic.  This is improved without significant intervention to [id number] systolic on recheck.  Patient with known DNR.  Patient with history of diabetes, stroke, dementia, polio, CKD 3A, BPH, anemia, glaucoma.  Recent admission was for suspected UTI, discharged 3/7.  Patient was placed on oral Bactrim at time of discharge.  He is completing that today.  Patient did require Foley catheterization during hospitalization.  Catheter was removed Thursday just prior to discharge.  Patient's wife feels that the patient is most likely being adequately, although he is in a diaper.  The history is provided by the patient and medical records.       Home Medications Prior to Admission medications   Medication Sig Start Date End Date Taking? Authorizing Provider  acetaminophen (TYLENOL) 325 MG tablet Take 2 tablets (650 mg total) by mouth every 6 (six) hours as needed for mild pain (or Fever >/= 101). 11/27/20   Debbe Odea, MD  amLODipine (NORVASC) 2.5 MG tablet Take 1 tablet (2.5 mg total) by mouth daily. 08/11/22   Little Ishikawa, MD  aspirin EC 81 MG tablet Take 1 tablet (81 mg total) by mouth daily. Swallow whole. 04/06/22   Little Ishikawa, MD  atorvastatin (LIPITOR) 20 MG tablet Take 20 mg by  mouth every evening.  11/03/13   [provider]  B Complex Vitamins (B COMPLEX PO) Take 1 tablet by mouth at bedtime.    [provider]  brimonidine (ALPHAGAN) 0.2 % ophthalmic solution Place 1 drop into both eyes 3 (three) times daily.    [provider]  cholecalciferol (VITAMIN D3) 10 MCG (400 UNIT) TABS tablet Take 400 Units by mouth daily.    [provider]  docusate sodium (COLACE) 100 MG capsule Take 1 capsule (100 mg total) by mouth 2 (two) times daily as needed for mild constipation. Patient not taking: Reported on 08/08/2022 03/09/22   Geradine Girt, DO  dorzolamide-timolol (COSOPT) 22.3-6.8 MG/ML ophthalmic solution Place 1 drop into both eyes 2 (two) times daily. 03/04/20   [provider]  feeding supplement, GLUCERNA SHAKE, (GLUCERNA SHAKE) LIQD Take 237 mLs by mouth 3 (three) times daily between meals. Patient not taking: Reported on 08/08/2022 03/25/20   Antonieta Pert, MD  Finerenone 10 MG TABS Take 1 tablet by mouth daily.    [provider]  latanoprost (XALATAN) 0.005 % ophthalmic solution Place 1 drop into both eyes at bedtime.    [provider]  levothyroxine (SYNTHROID) 88 MCG tablet Take 88 mcg by mouth daily before breakfast.    [provider]  memantine (NAMENDA) 10 MG tablet Take 10 mg by mouth in the morning and at bedtime. 11/04/20   [provider]  mirtazapine (REMERON SOL-TAB) 15 MG disintegrating tablet  Take 1 tablet (15 mg total) by mouth at bedtime. 03/09/22   Geradine Girt, DO  olmesartan (BENICAR) 20 MG tablet Take 20 mg by mouth daily.    [provider]  pantoprazole (PROTONIX) 40 MG tablet Take 1 tablet (40 mg total) by mouth daily. 07/08/22   Dion Saucier A, PA  polyethylene glycol (MIRALAX / GLYCOLAX) packet Take 17 g by mouth daily. Patient taking differently: Take 17 g by mouth daily as needed for mild constipation. 09/06/13   [name], MD  RHOPRESSA 0.02 %  SOLN Place 1 drop into both eyes at bedtime. 03/05/20   [provider]  sulfamethoxazole-trimethoprim (BACTRIM DS) 800-160 MG tablet Take 1 tablet by mouth 2 (two) times daily for 5 days. 08/24/22 08/29/22  Darliss Cheney, MD  Tamsulosin HCl (FLOMAX) 0.4 MG CAPS Take 0.4 mg by mouth in the morning and at bedtime.    [provider]      Allergies    Insulin glargine-lixisenatide, Metformin, Metformin and related, and Nsaids    Review of Systems   Review of Systems  All other systems reviewed and are negative.   Physical Exam Updated Vital Signs BP 130/70   Pulse 64   Temp 97.8 F (36.6 C) (Oral)   Resp 12   SpO2 99%  Physical Exam Vitals and nursing note reviewed.  Constitutional:      General: He is not in acute distress.    Appearance: He is well-developed.     Comments: Alert, chronically ill in appearance  HENT:     Head: Normocephalic and atraumatic.  Eyes:     Conjunctiva/sclera: Conjunctivae normal.     Pupils: Pupils are equal, round, and reactive to light.  Cardiovascular:     Rate and Rhythm: Normal rate and regular rhythm.     Heart sounds: Normal heart sounds.  Pulmonary:     Effort: Pulmonary effort is normal. No respiratory distress.     Breath sounds: Normal breath sounds.  Abdominal:     General: There is no distension.     Palpations: Abdomen is soft.     Tenderness: There is no abdominal tenderness.  Musculoskeletal:        General: No deformity. Normal range of motion.     Cervical back: Normal range of motion and neck supple.     Comments: BLE with muscle wasting c/w reported hx of polia  Skin:    General: Skin is warm and dry.     Comments: Superficial abrasion noted to the distal aspect of right great toe.  Likely related to recent transfers prior to arrival.  Neurological:     General: No focal deficit present.     Mental Status: He is alert and oriented to person, place, and time.     ED Results / Procedures / Treatments    Labs (all labs ordered are listed, but only abnormal results are displayed) Labs Reviewed  URINE CULTURE - Abnormal; Notable for the following components:      Result Value   Culture   (*)    Value: 20,000 COLONIES/mL KLEBSIELLA PNEUMONIAE Confirmed Extended Spectrum Beta-Lactamase Producer (ESBL).  In bloodstream infections from ESBL organisms, carbapenems are preferred over piperacillin/tazobactam. They are shown to have a lower risk of mortality.    Organism ID, Bacteria KLEBSIELLA PNEUMONIAE (*)    All other components within normal limits  CBC WITH DIFFERENTIAL/PLATELET - Abnormal; Notable for the following components:   RBC 3.91 (*)  Hemoglobin 11.8 (*)    HCT 36.6 (*)    RDW 17.2 (*)    All other components within normal limits  COMPREHENSIVE METABOLIC PANEL - Abnormal; Notable for the following components:   Glucose, Bld 174 (*)    Creatinine, Ser 1.61 (*)    AST 13 (*)    GFR, Estimated 42 (*)    All other components within normal limits  URINALYSIS, W/ REFLEX TO CULTURE (INFECTION SUSPECTED) - Abnormal; Notable for the following components:   Hgb urine dipstick MODERATE (*)    Leukocytes,Ua SMALL (*)    All other components within normal limits  CK - Abnormal; Notable for the following components:   Total CK 46 (*)    All other components within normal limits  BASIC METABOLIC PANEL - Abnormal; Notable for the following components:   Glucose, Bld 118 (*)    GFR, Estimated 58 (*)    All other components within normal limits  CBC - Abnormal; Notable for the following components:   RBC 3.80 (*)    Hemoglobin 11.5 (*)    HCT 35.9 (*)    RDW 16.8 (*)    All other components within normal limits  URINALYSIS, W/ REFLEX TO CULTURE (INFECTION SUSPECTED) - Abnormal; Notable for the following components:   APPearance HAZY (*)    Hgb urine dipstick LARGE (*)    Leukocytes,Ua SMALL (*)    Bacteria, UA RARE (*)    All other components within normal limits  GLUCOSE,  CAPILLARY - Abnormal; Notable for the following components:   Glucose-Capillary 120 (*)    All other components within normal limits  CBC WITH DIFFERENTIAL/PLATELET - Abnormal; Notable for the following components:   RBC 3.35 (*)    Hemoglobin 10.2 (*)    HCT 31.7 (*)    RDW 16.9 (*)    All other components within normal limits  BASIC METABOLIC PANEL - Abnormal; Notable for the following components:   CO2 21 (*)    Glucose, Bld 127 (*)    GFR, Estimated 59 (*)    All other components within normal limits  GLUCOSE, CAPILLARY - Abnormal; Notable for the following components:   Glucose-Capillary 156 (*)    All other components within normal limits  GLUCOSE, CAPILLARY - Abnormal; Notable for the following components:   Glucose-Capillary 104 (*)    All other components within normal limits  GLUCOSE, CAPILLARY - Abnormal; Notable for the following components:   Glucose-Capillary 136 (*)    All other components within normal limits  GLUCOSE, CAPILLARY - Abnormal; Notable for the following components:   Glucose-Capillary 140 (*)    All other components within normal limits  CBC - Abnormal; Notable for the following components:   RBC 3.51 (*)    Hemoglobin 10.6 (*)    HCT 36.7 (*)    MCV 104.6 (*)    MCHC 28.9 (*)    RDW 16.9 (*)    All other components within normal limits  BASIC METABOLIC PANEL - Abnormal; Notable for the following components:   CO2 19 (*)    Glucose, Bld 137 (*)    All other components within normal limits  GLUCOSE, CAPILLARY - Abnormal; Notable for the following components:   Glucose-Capillary 194 (*)    All other components within normal limits  GLUCOSE, CAPILLARY - Abnormal; Notable for the following components:   Glucose-Capillary 116 (*)    All other components within normal limits  GLUCOSE, CAPILLARY - Abnormal; Notable for the following  components:   Glucose-Capillary 226 (*)    All other components within normal limits  GLUCOSE, CAPILLARY - Abnormal;  Notable for the following components:   Glucose-Capillary 129 (*)    All other components within normal limits  COMPREHENSIVE METABOLIC PANEL - Abnormal; Notable for the following components:   Sodium 133 (*)    Glucose, Bld 116 (*)    Creatinine, Ser 1.31 (*)    Calcium 8.8 (*)    Total Protein 6.4 (*)    Albumin 3.1 (*)    AST 10 (*)    GFR, Estimated 54 (*)    Anion gap 4 (*)    All other components within normal limits  CBC - Abnormal; Notable for the following components:   RBC 3.14 (*)    Hemoglobin 9.4 (*)    HCT 29.5 (*)    RDW 16.8 (*)    All other components within normal limits  GLUCOSE, CAPILLARY - Abnormal; Notable for the following components:   Glucose-Capillary 159 (*)    All other components within normal limits  GLUCOSE, CAPILLARY - Abnormal; Notable for the following components:   Glucose-Capillary 65 (*)    All other components within normal limits  GLUCOSE, CAPILLARY - Abnormal; Notable for the following components:   Glucose-Capillary 110 (*)    All other components within normal limits  GLUCOSE, CAPILLARY - Abnormal; Notable for the following components:   Glucose-Capillary 165 (*)    All other components within normal limits  GLUCOSE, CAPILLARY - Abnormal; Notable for the following components:   Glucose-Capillary 122 (*)    All other components within normal limits  GLUCOSE, CAPILLARY - Abnormal; Notable for the following components:   Glucose-Capillary 127 (*)    All other components within normal limits  GLUCOSE, CAPILLARY - Abnormal; Notable for the following components:   Glucose-Capillary 138 (*)    All other components within normal limits  CBC WITH DIFFERENTIAL/PLATELET - Abnormal; Notable for the following components:   RBC 3.41 (*)    Hemoglobin 10.4 (*)    HCT 32.1 (*)    RDW 16.9 (*)    All other components within normal limits  BASIC METABOLIC PANEL - Abnormal; Notable for the following components:   Glucose, Bld 150 (*)    Calcium  8.6 (*)    Anion gap 2 (*)    All other components within normal limits  GLUCOSE, CAPILLARY - Abnormal; Notable for the following components:   Glucose-Capillary 181 (*)    All other components within normal limits  GLUCOSE, CAPILLARY - Abnormal; Notable for the following components:   Glucose-Capillary 136 (*)    All other components within normal limits  CBG MONITORING, ED - Abnormal; Notable for the following components:   Glucose-Capillary 106 (*)    All other components within normal limits  CBG MONITORING, ED - Abnormal; Notable for the following components:   Glucose-Capillary 107 (*)    All other components within normal limits  CBG MONITORING, ED - Abnormal; Notable for the following components:   Glucose-Capillary 171 (*)    All other components within normal limits  CULTURE, BLOOD (ROUTINE X 2)  RESP PANEL BY RT-PCR (RSV, FLU A&B, COVID)  [id number]  URINE CULTURE  LACTIC ACID, PLASMA  LIPASE, BLOOD  CORTISOL  MAGNESIUM  GLUCOSE, CAPILLARY  MAGNESIUM  CBC  BASIC METABOLIC PANEL  TROPONIN I (HIGH SENSITIVITY)  TROPONIN I (HIGH SENSITIVITY)    EKG EKG Interpretation  Date/Time:  Tuesday August 29 2022 13:56:45 EDT  Ventricular Rate:  62 PR Interval:  229 QRS Duration: 87 QT Interval:  428 QTC Calculation: 435 R Axis:   27 Text Interpretation: Sinus rhythm Prolonged PR interval Probable anteroseptal infarct, old Confirmed by Dene Gentry [phone]) on 08/29/2022 2:06:35 PM  Radiology DG Chest Port 1 View  Result Date: 08/29/2022 CLINICAL DATA:  Weakness EXAM: PORTABLE CHEST 1 VIEW COMPARISON:  CXR 08/21/22 FINDINGS: Unchanged small bilateral pleural effusions. No pneumothorax. Unchanged bibasilar atelectasis. Normal cardiac and mediastinal contours. Low lung volumes. Visualized upper abdomen is notable for gastric gaseous distention, new from prior exam. No radiographically apparent displaced rib fractures. IMPRESSION: 1. Unchanged small bilateral pleural effusions and  bibasilar atelectasis. 2. Gastric gaseous distention, new from prior exam. Electronically Signed   By: Marin Roberts M.D.   On: 08/29/2022 14:21    Procedures Procedures    Medications Ordered in ED Medications  sodium chloride 0.9 % bolus 1,000 mL (1,000 mLs Intravenous New Bag/Given 08/29/22 1430)    ED Course/ Medical Decision Making/ A&P                             Medical Decision Making Amount and/or Complexity of Data Reviewed Labs: ordered. Radiology: ordered.  Risk Decision regarding hospitalization.    Medical Screen Complete  This patient presented to the ED with complaint of hypotension.  This complaint involves an extensive number of treatment options. The initial differential diagnosis includes, but is not limited to, infection, dehydration, AKI, metabolic abnormality, etc.  This presentation is: Acute, Self-Limited, Previously Undiagnosed, Uncertain Prognosis, Complicated, Systemic Symptoms, and Threat to Life/Bodily Function  Patient sent from clinic for evaluation of apparent hypotension.  Initial labs are concerning for likely AKI with a creatinine at 1.6.  Oncoming EDP Darl Householder) is aware of case, pending labs, and pending disposition.   Additional history obtained:  Additional history obtained from Spouse External records from outside sources obtained and reviewed including prior ED visits and prior Inpatient records.    Lab Tests:  I ordered and personally interpreted labs.  The pertinent results include: CBC, BMP, UA, troponin   Imaging Studies ordered:  I ordered imaging studies including chest x-ray I independently visualized and interpreted obtained imaging which showed NAD I agree with the radiologist interpretation.   Cardiac Monitoring:  The patient was maintained on a cardiac monitor.  I personally viewed and interpreted the cardiac monitor which showed an underlying rhythm of: NSR  Problem List / ED Course:  Hypotension,  AKI   Reevaluation:  After the interventions noted above, I reevaluated the patient and found that they have: stayed the same  Disposition:  After consideration of the diagnostic results and the patients response to treatment, I feel that the patent would benefit from admission.          Final Clinical Impression(s) / ED Diagnoses Final diagnoses:  AKI (acute kidney injury) ([place])  Hypotension, unspecified hypotension type    Rx / DC Orders ED Discharge Orders     None         Valarie Merino, MD 09/03/22 1858

## 2022-08-29 NOTE — Progress Notes (Signed)
   This pt is active with our Palliative Care program which is provided by [facility] in the home. This is now a provider practice model with RN's and SW who work collaboratively as extensions of the [employer], [employer]. We will follow and assist with d/c as needed. Webb Silversmith RN [phone]

## 2022-08-29 NOTE — ED Triage Notes (Signed)
Pt sent from GI for hypotension and lethargic.  Pt denies pain at this time.  Pt is on hospice and DNR. Hx dementia.

## 2022-08-30 DIAGNOSIS — F03C11 Unspecified dementia, severe, with agitation: Secondary | ICD-10-CM

## 2022-08-30 DIAGNOSIS — N179 Acute kidney failure, unspecified: Secondary | ICD-10-CM | POA: Diagnosis not present

## 2022-08-30 DIAGNOSIS — E039 Hypothyroidism, unspecified: Secondary | ICD-10-CM

## 2022-08-30 DIAGNOSIS — B9689 Other specified bacterial agents as the cause of diseases classified elsewhere: Secondary | ICD-10-CM

## 2022-08-30 DIAGNOSIS — N390 Urinary tract infection, site not specified: Secondary | ICD-10-CM | POA: Insufficient documentation

## 2022-08-30 DIAGNOSIS — I10 Essential (primary) hypertension: Secondary | ICD-10-CM

## 2022-08-30 DIAGNOSIS — N400 Enlarged prostate without lower urinary tract symptoms: Secondary | ICD-10-CM | POA: Diagnosis not present

## 2022-08-30 DIAGNOSIS — I959 Hypotension, unspecified: Secondary | ICD-10-CM | POA: Diagnosis not present

## 2022-08-30 DIAGNOSIS — H409 Unspecified glaucoma: Secondary | ICD-10-CM

## 2022-08-30 LAB — BASIC METABOLIC PANEL
Anion gap: 7 (ref 5–15)
BUN: 18 mg/dL (ref 8–23)
CO2: 22 mmol/L (ref 22–32)
Calcium: 9.2 mg/dL (ref 8.9–10.3)
Chloride: 107 mmol/L (ref 98–111)
Creatinine, Ser: 1.23 mg/dL (ref 0.61–1.24)
GFR, Estimated: 58 mL/min — ABNORMAL LOW (ref >60)
Glucose, Bld: 118 mg/dL — ABNORMAL HIGH (ref 70–99)
Potassium: 4.9 mmol/L (ref 3.5–5.1)
Sodium: 136 mmol/L (ref 135–145)

## 2022-08-30 LAB — CBC
HCT: 35.9 % — ABNORMAL LOW (ref 39.0–52.0)
Hemoglobin: 11.5 g/dL — ABNORMAL LOW (ref 13.0–17.0)
MCH: 30.3 pg (ref 26.0–34.0)
MCHC: 32 g/dL (ref 30.0–36.0)
MCV: 94.5 fL (ref 80.0–100.0)
Platelets: 260 10*3/uL (ref 150–400)
RBC: 3.8 MIL/uL — ABNORMAL LOW (ref 4.22–5.81)
RDW: 16.8 % — ABNORMAL HIGH (ref 11.5–15.5)
WBC: 7.9 10*3/uL (ref 4.0–10.5)
nRBC: 0 % (ref 0.0–0.2)

## 2022-08-30 LAB — URINALYSIS, W/ REFLEX TO CULTURE (INFECTION SUSPECTED)
Bilirubin Urine: NEGATIVE
Glucose, UA: NEGATIVE mg/dL
Ketones, ur: NEGATIVE mg/dL
Nitrite: NEGATIVE
Protein, ur: NEGATIVE mg/dL
RBC / HPF: >50 RBC/hpf (ref 0–5)
Specific Gravity, Urine: 1.01 (ref 1.005–1.030)
pH: 6 (ref 5.0–8.0)

## 2022-08-30 LAB — TROPONIN I (HIGH SENSITIVITY): Troponin I (High Sensitivity): 4 ng/L (ref <18)

## 2022-08-30 LAB — GLUCOSE, CAPILLARY
Glucose-Capillary: 120 mg/dL — ABNORMAL HIGH (ref 70–99)
Glucose-Capillary: 156 mg/dL — ABNORMAL HIGH (ref 70–99)

## 2022-08-30 LAB — CBG MONITORING, ED
Glucose-Capillary: 107 mg/dL — ABNORMAL HIGH (ref 70–99)
Glucose-Capillary: 171 mg/dL — ABNORMAL HIGH (ref 70–99)

## 2022-08-30 LAB — CORTISOL: Cortisol, Plasma: 14.5 ug/dL

## 2022-08-30 MED ORDER — CHOLECALCIFEROL 10 MCG (400 UNIT) PO TABS
400.0000 [IU] | ORAL_TABLET | Freq: Every day | ORAL | Status: DC
  Administered 2022-08-30 – 2022-09-05 (×7): 400 [IU] via ORAL
  Filled 2022-08-30 (×8): qty 1

## 2022-08-30 MED ORDER — POLYETHYLENE GLYCOL 3350 17 G PO PACK
17.0000 g | PACK | Freq: Every day | ORAL | Status: DC
  Administered 2022-08-30 – 2022-09-05 (×7): 17 g via ORAL
  Filled 2022-08-30 (×7): qty 1

## 2022-08-30 MED ORDER — BRIMONIDINE TARTRATE 0.2 % OP SOLN
1.0000 [drp] | Freq: Three times a day (TID) | OPHTHALMIC | Status: DC
  Administered 2022-08-30 – 2022-09-05 (×17): 1 [drp] via OPHTHALMIC
  Filled 2022-08-30: qty 5

## 2022-08-30 MED ORDER — SENNOSIDES-DOCUSATE SODIUM 8.6-50 MG PO TABS
1.0000 | ORAL_TABLET | Freq: Two times a day (BID) | ORAL | Status: DC
  Administered 2022-08-30 – 2022-09-05 (×13): 1 via ORAL
  Filled 2022-08-30 (×13): qty 1

## 2022-08-30 MED ORDER — LATANOPROST 0.005 % OP SOLN
1.0000 [drp] | Freq: Every day | OPHTHALMIC | Status: DC
  Administered 2022-08-30 – 2022-09-04 (×6): 1 [drp] via OPHTHALMIC
  Filled 2022-08-30: qty 2.5

## 2022-08-30 MED ORDER — TRIPLE ANTIBIOTIC 3.5-400-5000 EX OINT: 1.0000 | TOPICAL_OINTMENT | Freq: Two times a day (BID) | CUTANEOUS | Status: DC

## 2022-08-30 MED ORDER — NETARSUDIL DIMESYLATE 0.02 % OP SOLN: 1.0000 [drp] | Freq: Every day | OPHTHALMIC | Status: DC

## 2022-08-30 MED ORDER — AMLODIPINE BESYLATE 5 MG PO TABS
2.5000 mg | ORAL_TABLET | Freq: Every day | ORAL | Status: DC
  Administered 2022-08-30 – 2022-09-01 (×3): 2.5 mg via ORAL
  Filled 2022-08-30 (×3): qty 1

## 2022-08-30 MED ORDER — BISACODYL 10 MG RE SUPP
10.0000 mg | Freq: Once | RECTAL | Status: AC
  Administered 2022-08-30: 10 mg via RECTAL
  Filled 2022-08-30: qty 1

## 2022-08-30 MED ORDER — SODIUM CHLORIDE 0.9 % IV SOLN
2.0000 g | INTRAVENOUS | Status: DC
  Administered 2022-08-30 – 2022-08-31 (×2): 2 g via INTRAVENOUS
  Filled 2022-08-30 (×2): qty 20

## 2022-08-30 MED ORDER — DORZOLAMIDE HCL-TIMOLOL MAL 2-0.5 % OP SOLN
1.0000 [drp] | Freq: Two times a day (BID) | OPHTHALMIC | Status: DC
  Administered 2022-08-30 – 2022-09-05 (×12): 1 [drp] via OPHTHALMIC
  Filled 2022-08-30: qty 10

## 2022-08-30 NOTE — ED Notes (Signed)
Gave report via phone to [employer]

## 2022-08-30 NOTE — ED Notes (Signed)
ED TO INPATIENT HANDOFF REPORT  ED Nurse Name and Phone #: Muaad Boehning [place] Breeze, Paramedic   S Name/Age/Gender Mike Mack 87 y.o. male Room/Bed: WA10/WA10  Code Status   Code Status: DNR  Home/SNF/Other   Patient oriented to: self Is this baseline? Yes   Triage Complete: Triage complete  Chief Complaint AKI (acute kidney injury) ([place]) [N17.9]  Triage Note Pt sent from GI for hypotension and lethargic.  Pt denies pain at this time.  Pt is on hospice and DNR. Hx dementia.    Allergies Allergies  Allergen Reactions   Insulin Glargine-Lixisenatide Other (See Comments) and Nausea And Vomiting    Stomach cramps   Metformin Nausea And Vomiting   Metformin And Related Other (See Comments)    Gi intolerance   Nsaids Other (See Comments)    Renal insufficiency    Level of Care/Admitting Diagnosis ED Disposition     ED Disposition  Admit   Condition  --   Comment  Hospital Area: [place] [id number]  Level of Care: Telemetry [5]  Admit to tele based on following criteria: Other see comments  Admit to tele based on following criteria: Monitor for Ischemic changes  Comments: monitor for arrythmia, due to hypotension  May place patient in observation at [facility] or [facility] if equivalent level of care is available:: No  Covid Evaluation: Asymptomatic - no recent exposure (last 10 days) testing not required  Diagnosis: AKI (acute kidney injury) [id number]  Admitting Physician: Jani Gravel [[id number]]  Attending Physician: Jani Gravel [[id number]]          B Medical/Surgery History Past Medical History:  Diagnosis Date   Acute metabolic encephalopathy [id number]   Anemia 05/13/2013   Arthritis    "right leg" (07/25/2013)   BPH (benign prostatic hyperplasia)    Cholecystitis, acute 05/24/2013   Constipation 05/13/2013   Exertional shortness of breath    "sometimes" (07/25/2013)   History of kidney stones    History of stomach ulcers     Hypertension    Hypothermia 04/01/2022   Neuropathy    Polio    Polio Childhood   Small bowel obstruction ([place])    Stroke ([place]) 2014   residual:  "left hand kind of numb" (07/25/2013)   Type II diabetes mellitus ([place])    Past Surgical History:  Procedure Laterality Date   BIOPSY  11/30/2020   Procedure: BIOPSY;  Surgeon: Ronnette Juniper, MD;  Location: [facility];  Service: Gastroenterology;;   BIOPSY  03/03/2022   Procedure: BIOPSY;  Surgeon: Clarene Essex, MD;  Location[facility];  Service: Gastroenterology;;   BOWEL RESECTION     CATARACT EXTRACTION W/ INTRAOCULAR LENS  IMPLANT, BILATERAL Bilateral 2000's   CHOLECYSTECTOMY N/A 07/25/2013   Procedure: LAPAROSCOPIC CHOLECYSTECTOMY WITH INTRAOPERATIVE CHOLANGIOGRAM;  Surgeon: [name], MD;  Location: [facility];  Service: General;  Laterality: N/A;   COLON SURGERY     COLONOSCOPY     Hx: of   CYSTOSCOPY WITH BIOPSY N/A 12/01/2019   Procedure: CYSTOSCOPY WITH BLADDER BIOPSY AND FULGURATION;  Surgeon: Lucas Mallow, MD;  [facility];  Service: Urology;  Laterality: N/A;   ESOPHAGOGASTRODUODENOSCOPY N/A 11/30/2020   Procedure: ESOPHAGOGASTRODUODENOSCOPY (EGD);  Surgeon: Ronnette Juniper, MD;  Location: [facility];  Service: Gastroenterology;  Laterality: N/A;   ESOPHAGOGASTRODUODENOSCOPY N/A 03/03/2022   Procedure: ESOPHAGOGASTRODUODENOSCOPY (EGD);  Surgeon: Clarene Essex, MD;  Location[facility];  Service: Gastroenterology;  Laterality: N/A;   EXCISIONAL HEMORRHOIDECTOMY  2000's  EXTRACORPOREAL SHOCK WAVE LITHOTRIPSY Right 10/03/2018   Procedure: EXTRACORPOREAL SHOCK WAVE LITHOTRIPSY (ESWL);  Surgeon: Festus Aloe, MD;  [facility];  Service: Urology;  Laterality: Right;   EYE SURGERY     INGUINAL HERNIA REPAIR Right 1970's   IR RADIOLOGIST EVAL & MGMT  04/06/2020   LAPAROSCOPIC CHOLECYSTECTOMY  07/25/2013   w/LOA (07/25/2013)     A IV Location/Drains/Wounds Patient Lines/Drains/Airways Status     Active  Line/Drains/Airways     Name Placement date Placement time Site Days   Peripheral IV 08/29/22 20 G Anterior;Left Forearm 08/29/22  1430  Forearm  1   External Urinary Catheter 08/30/22  0200  --  less than 1   Pressure Injury 03/09/22 Buttocks Right Stage 3 -  Full thickness tissue loss. Subcutaneous fat may be visible but bone, tendon or muscle are NOT exposed. 03/09/22  0848  -- 174   Pressure Injury 08/08/22 Ankle Left;Lateral Unstageable - Full thickness tissue loss in which the base of the injury is covered by slough (yellow, tan, gray, green or brown) and/or eschar (tan, brown or black) in the wound bed. Unstagable pressure injury 08/08/22  1800  -- 22   Wound / Incision (Open or Dehisced) 03/03/22 Irritant Dermatitis (Moisture Associated Skin Damage) Scrotum Left;Right 03/03/22  1000  Scrotum  180            Intake/Output Last 24 hours  Intake/Output Summary (Last 24 hours) at 08/30/2022 1147 Last data filed at 08/29/2022 2338 Gross per 24 hour  Intake 1400 ml  Output --  Net 1400 ml    Labs/Imaging Results for orders placed or performed during the hospital encounter of 08/29/22 (from the past 48 hour(s))  CBC with Differential     Status: Abnormal   Collection Time: 08/29/22  2:12 PM  Result Value Ref Range   WBC 6.9 4.0 - 10.5 K/uL   RBC 3.91 (L) 4.22 - 5.81 MIL/uL   Hemoglobin 11.8 (L) 13.0 - 17.0 g/dL   HCT 36.6 (L) 39.0 - 52.0 %   MCV 93.6 80.0 - 100.0 fL   MCH 30.2 26.0 - 34.0 pg   MCHC 32.2 30.0 - 36.0 g/dL   RDW 17.2 (H) 11.5 - 15.5 %   Platelets 271 150 - 400 K/uL   nRBC 0.0 0.0 - 0.2 %   Neutrophils Relative % 58 %   Neutro Abs 4.1 1.7 - 7.7 K/uL   Lymphocytes Relative 33 %   Lymphs Abs 2.3 0.7 - 4.0 K/uL   Monocytes Relative 7 %   Monocytes Absolute 0.5 0.1 - 1.0 K/uL   Eosinophils Relative 1 %   Eosinophils Absolute 0.1 0.0 - 0.5 K/uL   Basophils Relative 1 %   Basophils Absolute 0.0 0.0 - 0.1 K/uL   Immature Granulocytes 0 %   Abs Immature  Granulocytes 0.02 0.00 - 0.07 K/uL    Comment: Performed at [facility], [place] [address] [place], [place] [address]  Comprehensive metabolic panel     Status: Abnormal   Collection Time: 08/29/22  2:12 PM  Result Value Ref Range   Sodium 137 135 - 145 mmol/L   Potassium 4.9 3.5 - 5.1 mmol/L   Chloride 106 98 - 111 mmol/L   CO2 24 22 - 32 mmol/L   Glucose, Bld 174 (H) 70 - 99 mg/dL    Comment: Glucose reference range applies only to samples taken after fasting for at least 8 hours.   BUN 17 8 - 23  mg/dL   Creatinine, Ser 1.61 (H) 0.61 - 1.24 mg/dL   Calcium 9.0 8.9 - 10.3 mg/dL   Total Protein 7.3 6.5 - 8.1 g/dL   Albumin 3.6 3.5 - 5.0 g/dL   AST 13 (L) 15 - 41 U/L   ALT 13 0 - 44 U/L   Alkaline Phosphatase 105 38 - 126 U/L   Total Bilirubin 0.5 0.3 - 1.2 mg/dL   GFR, Estimated 42 (L) >60 mL/min    Comment: (NOTE) Calculated using the CKD-EPI Creatinine Equation (2021)    Anion gap 7 5 - 15    Comment: Performed at [facility], [place] [address], [place], [place] [address]  Lactic acid, plasma     Status: None   Collection Time: 08/29/22  2:12 PM  Result Value Ref Range   Lactic Acid, Venous 1.7 0.5 - 1.9 mmol/L    Comment: Performed at [facility], [place] [address], [place], [place] [address]  Lipase, blood     Status: None   Collection Time: 08/29/22  2:12 PM  Result Value Ref Range   Lipase 32 11 - 51 U/L    Comment: Performed at [facility], [place] [address], [place], [place] [address]  Culture, blood (routine x 2)     Status: None (Preliminary result)   Collection Time: 08/29/22  2:12 PM   Specimen: BLOOD RIGHT ARM  Result Value Ref Range   Specimen Description      BLOOD RIGHT ARM Performed at [facility] [address], [place], [place] [address]    Special Requests      BOTTLES DRAWN AEROBIC AND ANAEROBIC Blood Culture adequate volume Performed at [facility] [address] [place], [place] [address]    Culture      NO GROWTH < 24 HOURS Performed at [place] [address], [place], [place] [address]    Report Status PENDING   Resp panel by RT-PCR (RSV, Flu A&B, Covid) Anterior Nasal Swab     Status: None   Collection Time: 08/29/22  2:12 PM   Specimen: Anterior Nasal Swab  Result Value Ref Range   SARS Coronavirus 2 by RT PCR NEGATIVE NEGATIVE    Comment: (NOTE) SARS-CoV-2 target nucleic acids are NOT DETECTED.  The SARS-CoV-2 RNA is generally detectable in upper respiratory specimens during the acute phase of infection. The lowest concentration of SARS-CoV-2 viral copies this assay can detect is 138 copies/mL. A negative result does not preclude SARS-Cov-2 infection and should not be used as the sole basis for treatment or other patient management decisions. A negative result may occur with  improper specimen collection/handling, submission of specimen other than nasopharyngeal swab, presence of viral mutation(s) within the areas targeted by this assay, and inadequate number of viral copies(<138 copies/mL). A negative result must be combined with clinical observations, patient history, and epidemiological information. The expected result is Negative.  Fact Sheet for Patients:  EntrepreneurPulse.com.au  Fact Sheet for Healthcare Providers:  IncredibleEmployment.be  This test is no t yet approved or cleared by the [place] FDA and  has been authorized for detection and/or diagnosis of SARS-CoV-2 by FDA under an Emergency Use Authorization (EUA). This EUA will remain  in effect (meaning this test can be used) for the duration of the COVID-19 declaration under Section 564(b)(1) of the Act, 21 U.S.C.section 360bbb-3(b)(1), unless the authorization is terminated  or revoked sooner.  Influenza A by PCR NEGATIVE NEGATIVE   Influenza B by PCR NEGATIVE NEGATIVE    Comment:  (NOTE) The Xpert Xpress SARS-CoV-2/FLU/RSV plus assay is intended as an aid in the diagnosis of influenza from Nasopharyngeal swab specimens and should not be used as a sole basis for treatment. Nasal washings and aspirates are unacceptable for Xpert Xpress SARS-CoV-2/FLU/RSV testing.  Fact Sheet for Patients: EntrepreneurPulse.com.au  Fact Sheet for Healthcare Providers: IncredibleEmployment.be  This test is not yet approved or cleared by the [place] FDA and has been authorized for detection and/or diagnosis of SARS-CoV-2 by FDA under an Emergency Use Authorization (EUA). This EUA will remain in effect (meaning this test can be used) for the duration of the COVID-19 declaration under Section 564(b)(1) of the Act, 21 U.S.C. section 360bbb-3(b)(1), unless the authorization is terminated or revoked.     Resp Syncytial Virus by PCR NEGATIVE NEGATIVE    Comment: (NOTE) Fact Sheet for Patients: EntrepreneurPulse.com.au  Fact Sheet for Healthcare Providers: IncredibleEmployment.be  This test is not yet approved or cleared by the [place] FDA and has been authorized for detection and/or diagnosis of SARS-CoV-2 by FDA under an Emergency Use Authorization (EUA). This EUA will remain in effect (meaning this test can be used) for the duration of the COVID-19 declaration under Section 564(b)(1) of the Act, 21 U.S.C. section 360bbb-3(b)(1), unless the authorization is terminated or revoked.  Performed at [facility], [place] [address], [place], [place] [address]   Troponin I (High Sensitivity)     Status: None   Collection Time: 08/29/22  2:12 PM  Result Value Ref Range   Troponin I (High Sensitivity) 4 <18 ng/L    Comment: (NOTE) Elevated high sensitivity troponin I (hsTnI) values and significant  changes across serial measurements may suggest ACS but many other  chronic and acute  conditions are known to elevate hsTnI results.  Refer to the "Links" section for chest pain algorithms and additional  guidance. Performed at [facility], [place] [address], [place], [place] [address]   CK     Status: Abnormal   Collection Time: 08/29/22  2:12 PM  Result Value Ref Range   Total CK 46 (L) 49 - 397 U/L    Comment: Performed at [facility], [place] [address], [place], [place] [address]  Urinalysis, w/ Reflex to Culture (Infection Suspected) -Urine, Clean Catch     Status: Abnormal   Collection Time: 08/29/22  2:29 PM  Result Value Ref Range   Specimen Source URINE, CLEAN CATCH    Color, Urine YELLOW YELLOW   APPearance CLEAR CLEAR   Specific Gravity, Urine 1.010 1.005 - 1.030   pH 7.0 5.0 - 8.0   Glucose, UA NEGATIVE NEGATIVE mg/dL   Hgb urine dipstick MODERATE (A) NEGATIVE   Bilirubin Urine NEGATIVE NEGATIVE   Ketones, ur NEGATIVE NEGATIVE mg/dL   Protein, ur NEGATIVE NEGATIVE mg/dL   Nitrite NEGATIVE NEGATIVE   Leukocytes,Ua SMALL (A) NEGATIVE   RBC / HPF >50 0 - 5 RBC/hpf   WBC, UA >50 0 - 5 WBC/hpf    Comment:        Reflex urine culture not performed if WBC <=10, OR if Squamous epithelial cells >5. If Squamous epithelial cells >5 suggest recollection.    Bacteria, UA NONE SEEN NONE SEEN   Squamous Epithelial / HPF 0-5 0 - 5 /HPF   Mucus PRESENT     Comment: Performed at [facility], [place]  [place], [place] [address]  CBG monitoring, ED     Status: Abnormal   Collection Time: 08/29/22 10:35 PM  Result Value Ref Range   Glucose-Capillary 106 (H) 70 - 99 mg/dL    Comment: Glucose reference range applies only to samples taken after fasting for at least 8 hours.  Troponin I (High Sensitivity)     Status: None   Collection Time: 08/30/22  3:46 AM  Result Value Ref Range   Troponin I (High Sensitivity) 4 <18 ng/L    Comment: (NOTE) Elevated high sensitivity troponin I (hsTnI) values and  significant  changes across serial measurements may suggest ACS but many other  chronic and acute conditions are known to elevate hsTnI results.  Refer to the "Links" section for chest pain algorithms and additional  guidance. Performed at [facility], [place] [address] [place], [place] [id number]   Basic metabolic panel     Status: Abnormal   Collection Time: 08/30/22  3:46 AM  Result Value Ref Range   Sodium 136 135 - 145 mmol/L   Potassium 4.9 3.5 - 5.1 mmol/L   Chloride 107 98 - 111 mmol/L   CO2 22 22 - 32 mmol/L   Glucose, Bld 118 (H) 70 - 99 mg/dL    Comment: Glucose reference range applies only to samples taken after fasting for at least 8 hours.   BUN 18 8 - 23 mg/dL   Creatinine, Ser 1.23 0.61 - 1.24 mg/dL   Calcium 9.2 8.9 - 10.3 mg/dL   GFR, Estimated 58 (L) >60 mL/min    Comment: (NOTE) Calculated using the CKD-EPI Creatinine Equation (2021)    Anion gap 7 5 - 15    Comment: Performed at [facility], [place] [name], [place], [place] [address]  CBC     Status: Abnormal   Collection Time: 08/30/22  3:46 AM  Result Value Ref Range   WBC 7.9 4.0 - 10.5 K/uL   RBC 3.80 (L) 4.22 - 5.81 MIL/uL   Hemoglobin 11.5 (L) 13.0 - 17.0 g/dL   HCT 35.9 (L) 39.0 - 52.0 %   MCV 94.5 80.0 - 100.0 fL   MCH 30.3 26.0 - 34.0 pg   MCHC 32.0 30.0 - 36.0 g/dL   RDW 16.8 (H) 11.5 - 15.5 %   Platelets 260 150 - 400 K/uL   nRBC 0.0 0.0 - 0.2 %    Comment: Performed at [facility], [place] [address] [place], [place] [address]  Cortisol     Status: None   Collection Time: 08/30/22  3:46 AM  Result Value Ref Range   Cortisol, Plasma 14.5 ug/dL    Comment: (NOTE) AM    6.7 - 22.6 ug/dL PM   <10.0       ug/dL Performed at [place] [address] [place],  [address]   CBG monitoring, ED     Status: Abnormal   Collection Time: 08/30/22  9:29 AM  Result Value Ref Range   Glucose-Capillary 107 (H) 70 - 99 mg/dL     Comment: Glucose reference range applies only to samples taken after fasting for at least 8 hours.   CT Renal Stone Study  Result Date: 08/29/2022 CLINICAL DATA:  Abdominal/flank pain. Stone suspected. Acute kidney injury. EXAM: CT ABDOMEN AND PELVIS WITHOUT CONTRAST TECHNIQUE: Multidetector CT imaging of the abdomen and pelvis was performed following the standard protocol without IV contrast. RADIATION DOSE REDUCTION: This exam was performed according to  the departmental dose-optimization program which includes automated exposure control, adjustment of the mA and/or kV according to patient size and/or use of iterative reconstruction technique. COMPARISON:  Ultrasound 02/28/2022.  CT 09/16/2021. FINDINGS: Lower chest: Chronic bilateral effusions larger on the right than the left with mild dependent atelectasis. Elevation of the left hemidiaphragm. Hepatobiliary: Liver parenchyma is normal without contrast. Previous cholecystectomy. Pancreas: Normal Spleen: Normal Adrenals/Urinary Tract: Adrenal glands are normal. The kidneys are small and contain multiple simple appearing cysts. No evidence of mass or hydronephrosis. No follow-up recommended for the cysts. The bladder is normal. Stomach/Bowel: Small hiatal hernia. Small bowel is normal. Normal appendix. Large amount of fecal matter throughout the colon which could be symptomatic. No evidence of diverticulitis or other acute colon finding otherwise. Vascular/Lymphatic: Aortic atherosclerosis. No aneurysm. No adenopathy. Reproductive: Markedly enlarged prostate gland. Other: No free fluid or air. Musculoskeletal: Pronounced fatty atrophy of the right gluteal and leg muscles. No apparent spinal etiology as seen. IMPRESSION: 1. No acute finding to explain the clinical presentation. No evidence of urinary tract stone disease or hydronephrosis. Small kidneys with simple appearing cysts that do not require specific follow-up. 2. Chronic bilateral pleural effusions  larger on the right than the left with mild dependent atelectasis. Elevation of the left hemidiaphragm. 3. Large amount of fecal matter throughout the colon which could be symptomatic. No evidence of diverticulitis or other acute bowel finding. 4. Aortic atherosclerosis. 5. Markedly enlarged prostate gland. 6. Pronounced fatty atrophy of the right gluteal and leg muscles. No apparent spinal etiology as seen. Aortic Atherosclerosis (ICD10-I70.0). Electronically Signed   By: Nelson Chimes M.D.   On: 08/29/2022 18:00   DG Chest Port 1 View  Result Date: 08/29/2022 CLINICAL DATA:  Weakness EXAM: PORTABLE CHEST 1 VIEW COMPARISON:  CXR 08/21/22 FINDINGS: Unchanged small bilateral pleural effusions. No pneumothorax. Unchanged bibasilar atelectasis. Normal cardiac and mediastinal contours. Low lung volumes. Visualized upper abdomen is notable for gastric gaseous distention, new from prior exam. No radiographically apparent displaced rib fractures. IMPRESSION: 1. Unchanged small bilateral pleural effusions and bibasilar atelectasis. 2. Gastric gaseous distention, new from prior exam. Electronically Signed   By: Marin Roberts M.D.   On: 08/29/2022 14:21    Pending Labs Unresulted Labs (From admission, onward)     Start     Ordered   09/05/22 0500  Creatinine, serum  (enoxaparin (LOVENOX)    CrCl >/= 30 ml/min)  Weekly,   R     Comments: while on enoxaparin therapy    08/29/22 1941   08/30/22 0957  Urinalysis, w/ Reflex to Culture (Infection Suspected) -Urine, Clean Catch  (Urine Labs)  ONCE - URGENT,   URGENT       Question:  Specimen Source  Answer:  Urine, Clean Catch   08/30/22 0956   08/29/22 1429  Urine Culture  Once,   R        08/29/22 1429   08/29/22 1400  Culture, blood (routine x 2)  BLOOD CULTURE X 2,   R (with STAT occurrences)      08/29/22 1400            Vitals/Pain Today's Vitals   08/30/22 0600 08/30/22 0814 08/30/22 1000 08/30/22 1100  BP: (!) 146/77  (!) 166/94 (!) 160/73   Pulse: 78   81  Resp: '16  14 14  '$ Temp:  97.9 F (36.6 C)    TempSrc:  Oral    SpO2: 100%   100%  PainSc:  Isolation Precautions No active isolations  Medications Medications  aspirin EC tablet 81 mg (81 mg Oral Given 08/30/22 1058)  atorvastatin (LIPITOR) tablet 20 mg (has no administration in time range)  memantine (NAMENDA) tablet 10 mg (10 mg Oral Given 08/29/22 2255)  levothyroxine (SYNTHROID) tablet 88 mcg (88 mcg Oral Given 08/30/22 0557)  pantoprazole (PROTONIX) EC tablet 40 mg (40 mg Oral Given 08/30/22 1058)  mirtazapine (REMERON SOL-TAB) disintegrating tablet 15 mg (15 mg Oral Given 08/29/22 2229)  enoxaparin (LOVENOX) injection 30 mg (30 mg Subcutaneous Given 08/29/22 2255)  0.9 %  sodium chloride infusion ( Intravenous New Bag/Given 08/29/22 2020)  acetaminophen (TYLENOL) tablet 650 mg (has no administration in time range)    Or  acetaminophen (TYLENOL) suppository 650 mg (has no administration in time range)  hydrALAZINE (APRESOLINE) injection 5 mg (has no administration in time range)  insulin aspart (novoLOG) injection 0-6 Units ( Subcutaneous Not Given 08/30/22 0932)  insulin aspart (novoLOG) injection 0-5 Units ( Subcutaneous Not Given 08/29/22 2246)  tamsulosin (FLOMAX) capsule 0.4 mg (0.4 mg Oral Given 08/29/22 2255)  cefTRIAXone (ROCEPHIN) 2 g in sodium chloride 0.9 % 100 mL IVPB (has no administration in time range)  polyethylene glycol (MIRALAX / GLYCOLAX) packet 17 g (17 g Oral Given 08/30/22 1109)  senna-docusate (Senokot-S) tablet 1 tablet (1 tablet Oral Given 08/30/22 1058)  brimonidine (ALPHAGAN) 0.2 % ophthalmic solution 1 drop (has no administration in time range)  cholecalciferol (VITAMIN D3) 10 MCG (400 UNIT) tablet 400 Units (has no administration in time range)  dorzolamide-timolol (COSOPT) 2-0.5 % ophthalmic solution 1 drop (has no administration in time range)  latanoprost (XALATAN) 0.005 % ophthalmic solution 1 drop (has no administration in time  range)  Netarsudil Dimesylate 0.02 % SOLN 1 drop (has no administration in time range)  amLODipine (NORVASC) tablet 2.5 mg (has no administration in time range)  sodium chloride 0.9 % bolus 1,000 mL (0 mLs Intravenous Stopped 08/29/22 1631)    Mobility non-ambulatory     Focused Assessments     R Recommendations: See Admitting Provider Note  Report given to:   Additional Notes: pt took meds by crushing them, wife with pt

## 2022-08-30 NOTE — Consult Note (Signed)
[facility] Nurse Consult Note: Reason for Consult:partial thickness skin loss (abrasion) to right great toe at distal tip Wound type:trauma Pressure Injury POA: N/A Measurement:1 x 69m per admitting provider note Wound bed:red, dry Drainage (amount, consistency, odor) scant serous Periwound:intact dry Dressing procedure/placement/frequency: I have provided nursing with guidance for the care of this lesion using a NS cleanse followed by daily painting with a povidone-iodine (betadine) swabstick. Once dry, the lesions can be dressed with a dry gauze 2x2 and secured with conform bandaging/paper tape. PI prevention measures such as turning and repositioning, placement of a sacral prophylactic foam and floatation of the bilateral heels are ordered.  [facility] will not follow, but will remain available to this patient, the nursing and medical teams.  Please re-consult if needed.  Thank you for inviting [facility] participate in this patient's Plan of Care.  LMaudie Flakes MSN, RN, CNS, [place] CSerita Grammes [employer] FUnisys Corporationphone:  [phone]

## 2022-08-30 NOTE — Progress Notes (Signed)
PROGRESS NOTE    Mike Mack  [id number] DOB: [birth year] DOA: 08/29/2022 PCP: [name], MD   Chief Complaint  Patient presents with   Hypotension    Brief Narrative:  Patient 87 year old gentleman history of remote polio, hypertension, type 2 diabetes, neuropathy, history of CVA, dementia, anemia, bilateral pleural effusions, BPH presented with hypotension with systolic blood pressures in the 70s while at PCPs office, and sent to the ED.  Due to dementia patient unable to provide meaningful history wife noted to admitting physician that patient recently diagnosed with UTI and started on Bactrim 08/25/2022.  Patient noted to have some complaints of dysuria which per wife first time she is hearing about on 08/30/2022.  Also concern for right great toe skin abrasion.  Patient also noted to be in acute kidney injury and urinalysis concerning for UTI.  Patient placed empirically on IV antibiotics and pancultured.   Assessment & Plan:   Active Problems:   Hypertension   BPH (benign prostatic hyperplasia)   H/O: CVA (cerebrovascular accident)   Type 2 diabetes mellitus (HCC)   Dementia (HCC)   Acute lower UTI   Pleural effusion, bilateral   AKI (acute kidney injury) ([place])   Hypotension   Hypothyroidism   Urinary tract infection without hematuria  #1 hypotension -Patient was sent from PCPs office due to systolic blood pressures in the 70s felt to be hypotensive. -Concern for infectious etiology as patient with recent UTI on Bactrim, versus secondary to hypovolemia. -Patient noted to also be on Bactrim prior to admission. -Urinalysis concerning for UTI. -Blood cultures ordered and pending. -Check urine cultures. -Blood pressure responded to IV fluids. -Continue empiric IV antibiotics. -Supportive care.  2.  Acute kidney injury -Likely secondary to prerenal azotemia in the setting of hypotension and decreased fluid intake in the setting of Bactrim which may have led  to interstitial nephritis.  Patient also on losartan prior to admission. -CT renal stone protocol negative for hydronephrosis, small kidneys with simple appearing cyst did not require specific follow-up. -Renal function improved with hydration. -Continue to hold ARB. -Bactrim discontinued. -Gentle hydration. -Continue empiric IV antibiotics.  3.  UTI -Check urine cultures. -Bactrim discontinued. -On IV Rocephin will change dose to 2 g daily. This-supportive care.  4.  Hypertension -Patient on initial presentation noted to be hypotensive with systolic blood pressures in the 70s and antihypertensive medications held. -BP seems to have responded to IV fluids and IV antibiotics. -Resume home regimen low-dose Norvasc. -Continue to hold ARB.  5.  Dementia -Continue home regimen Namenda and Remeron.  6.  Hypothyroidism -Continue Synthroid.  7.  Glaucoma -Resume home regimen eyedrops.  8.  BPH -Continue Flomax.  9.  Skin abrasion first right toe -Continue current wound care.  9.  Constipation -Placed on Lasix daily, Senokot-S twice daily. -Dulcolax suppository x 1.   DVT prophylaxis: Lovenox Code Status: DNR Family Communication: Updated wife at bedside. Disposition: Likely home when clinically improved.  Status is: Observation The patient remains OBS appropriate and will d/c before 2 midnights.   Consultants:  None  Procedures:  CT renal stone protocol 08/29/2022 Chest x-ray 08/29/2022   Antimicrobials:  IV Rocephin 08/29/2022>>>>   Subjective: Sitting up on gurney.  No chest pain.  No shortness of breath.  No abdominal pain.  Complaining of some left back pain.  Complaining of dysuria per wife today, who is at bedside.  Tolerated breakfast with wife.  Objective: Vitals:   08/30/22 1100 08/30/22 1218 08/30/22 1324 08/30/22 1351  BP: (!) 160/73  (!) 179/79   Pulse: 81  74   Resp: 14  18   Temp:  97.9 F (36.6 C) 98.2 F (36.8 C)   TempSrc:  Oral Oral    SpO2: 100%  100%   Weight:    63 kg  Height:    '5\' 5"'$  (1.651 m)    Intake/Output Summary (Last 24 hours) at 08/30/2022 1746 Last data filed at 08/29/2022 2338 Gross per 24 hour  Intake 400 ml  Output --  Net 400 ml   Filed Weights   08/30/22 1351  Weight: 63 kg    Examination:  General exam: Appears calm and comfortable  Respiratory system: Clear to auscultation.  No wheezes, no crackles, no rhonchi.  Fair air movement.  Speaking in full sentences.  Respiratory effort normal. Cardiovascular system: S1 & S2 heard, RRR. No JVD, murmurs, rubs, gallops or clicks. No pedal edema. Gastrointestinal system: Abdomen is nondistended, soft and nontender. No organomegaly or masses felt. Normal bowel sounds heard.??  Left flank pain. Central nervous system: Alert and oriented. No focal neurological deficits. Extremities: Symmetric 5 x 5 power. Skin: No rashes, lesions or ulcers Psychiatry: Judgement and insight appear normal. Mood & affect appropriate.     Data Reviewed: I have personally reviewed following labs and imaging studies  CBC: Recent Labs  Lab 08/24/22 0436 08/29/22 1412 08/30/22 0346  WBC 5.9 6.9 7.9  NEUTROABS 3.2 4.1  --   HGB 10.0* 11.8* 11.5*  HCT 31.0* 36.6* 35.9*  MCV 92.5 93.6 94.5  PLT 233 271 [id number]    Basic Metabolic Panel: Recent Labs  Lab 08/24/22 0436 08/29/22 1412 08/30/22 0346  NA 136 137 136  K 4.2 4.9 4.9  CL 108 106 107  CO2 '22 24 22  '$ GLUCOSE 156* 174* 118*  BUN '16 17 18  '$ CREATININE 1.22 1.61* 1.23  CALCIUM 8.9 9.0 9.2    GFR: Estimated Creatinine Clearance: 38.9 mL/min (by C-G formula based on SCr of 1.23 mg/dL).  Liver Function Tests: Recent Labs  Lab 08/29/22 1412  AST 13*  ALT 13  ALKPHOS 105  BILITOT 0.5  PROT 7.3  ALBUMIN 3.6    CBG: Recent Labs  Lab 08/24/22 1122 08/29/22 2235 08/30/22 0929 08/30/22 1216 08/30/22 1721  GLUCAP 210* 106* 107* 171* 120*     Recent Results (from the past 240 hour(s))  Culture,  blood (routine x 2)     Status: None (Preliminary result)   Collection Time: 08/29/22  2:12 PM   Specimen: BLOOD RIGHT ARM  Result Value Ref Range Status   Specimen Description   Final    BLOOD RIGHT ARM Performed at [facility], [place] [address], [place], [place] [address]    Special Requests   Final    BOTTLES DRAWN AEROBIC AND ANAEROBIC Blood Culture adequate volume Performed at [facility] [address] [place], [place] [address]    Culture   Final    NO GROWTH < 24 HOURS Performed at [facility] [address] [place], [place] [address]    Report Status PENDING  Incomplete  Resp panel by RT-PCR (RSV, Flu A&B, Covid) Anterior Nasal Swab     Status: None   Collection Time: 08/29/22  2:12 PM   Specimen: Anterior Nasal Swab  Result Value Ref Range Status   SARS Coronavirus 2 by RT PCR NEGATIVE NEGATIVE Final    Comment: (NOTE) SARS-CoV-2 target nucleic acids are NOT DETECTED.  The SARS-CoV-2 RNA is generally detectable in upper respiratory specimens during the acute phase of infection. The lowest concentration of SARS-CoV-2 viral copies this assay can detect is 138 copies/mL. A negative result does not preclude SARS-Cov-2 infection and should not be used as the sole basis for treatment or other patient management decisions. A negative result may occur with  improper specimen collection/handling, submission of specimen other than nasopharyngeal swab, presence of viral mutation(s) within the areas targeted by this assay, and inadequate number of viral copies(<138 copies/mL). A negative result must be combined with clinical observations, patient history, and epidemiological information. The expected result is Negative.  Fact Sheet for Patients:  EntrepreneurPulse.com.au  Fact Sheet for Healthcare Providers:  IncredibleEmployment.be  This test is no t yet approved or cleared by the [place] FDA and   has been authorized for detection and/or diagnosis of SARS-CoV-2 by FDA under an Emergency Use Authorization (EUA). This EUA will remain  in effect (meaning this test can be used) for the duration of the COVID-19 declaration under Section 564(b)(1) of the Act, 21 U.S.C.section 360bbb-3(b)(1), unless the authorization is terminated  or revoked sooner.       Influenza A by PCR NEGATIVE NEGATIVE Final   Influenza B by PCR NEGATIVE NEGATIVE Final    Comment: (NOTE) The Xpert Xpress SARS-CoV-2/FLU/RSV plus assay is intended as an aid in the diagnosis of influenza from Nasopharyngeal swab specimens and should not be used as a sole basis for treatment. Nasal washings and aspirates are unacceptable for Xpert Xpress SARS-CoV-2/FLU/RSV testing.  Fact Sheet for Patients: EntrepreneurPulse.com.au  Fact Sheet for Healthcare Providers: IncredibleEmployment.be  This test is not yet approved or cleared by the [place] FDA and has been authorized for detection and/or diagnosis of SARS-CoV-2 by FDA under an Emergency Use Authorization (EUA). This EUA will remain in effect (meaning this test can be used) for the duration of the COVID-19 declaration under Section 564(b)(1) of the Act, 21 U.S.C. section 360bbb-3(b)(1), unless the authorization is terminated or revoked.     Resp Syncytial Virus by PCR NEGATIVE NEGATIVE Final    Comment: (NOTE) Fact Sheet for Patients: EntrepreneurPulse.com.au  Fact Sheet for Healthcare Providers: IncredibleEmployment.be  This test is not yet approved or cleared by the [place] FDA and has been authorized for detection and/or diagnosis of SARS-CoV-2 by FDA under an Emergency Use Authorization (EUA). This EUA will remain in effect (meaning this test can be used) for the duration of the COVID-19 declaration under Section 564(b)(1) of the Act, 21 U.S.C. section 360bbb-3(b)(1),  unless the authorization is terminated or revoked.  Performed at [facility], [place] [address], [place], [place] [address]          Radiology Studies: CT Renal Stone Study  Result Date: 08/29/2022 CLINICAL DATA:  Abdominal/flank pain. Stone suspected. Acute kidney injury. EXAM: CT ABDOMEN AND PELVIS WITHOUT CONTRAST TECHNIQUE: Multidetector CT imaging of the abdomen and pelvis was performed following the standard protocol without IV contrast. RADIATION DOSE REDUCTION: This exam was performed according to the departmental dose-optimization program which includes automated exposure control, adjustment of the mA and/or kV according to patient size and/or use of iterative reconstruction technique. COMPARISON:  Ultrasound 02/28/2022.  CT 09/16/2021. FINDINGS: Lower chest: Chronic bilateral effusions larger on the right than the left with mild dependent atelectasis. Elevation of the left hemidiaphragm. Hepatobiliary: Liver parenchyma is normal without contrast. Previous cholecystectomy. Pancreas: Normal Spleen: Normal Adrenals/Urinary Tract: Adrenal glands are normal. The kidneys are small and contain  multiple simple appearing cysts. No evidence of mass or hydronephrosis. No follow-up recommended for the cysts. The bladder is normal. Stomach/Bowel: Small hiatal hernia. Small bowel is normal. Normal appendix. Large amount of fecal matter throughout the colon which could be symptomatic. No evidence of diverticulitis or other acute colon finding otherwise. Vascular/Lymphatic: Aortic atherosclerosis. No aneurysm. No adenopathy. Reproductive: Markedly enlarged prostate gland. Other: No free fluid or air. Musculoskeletal: Pronounced fatty atrophy of the right gluteal and leg muscles. No apparent spinal etiology as seen. IMPRESSION: 1. No acute finding to explain the clinical presentation. No evidence of urinary tract stone disease or hydronephrosis. Small kidneys with simple appearing cysts that  do not require specific follow-up. 2. Chronic bilateral pleural effusions larger on the right than the left with mild dependent atelectasis. Elevation of the left hemidiaphragm. 3. Large amount of fecal matter throughout the colon which could be symptomatic. No evidence of diverticulitis or other acute bowel finding. 4. Aortic atherosclerosis. 5. Markedly enlarged prostate gland. 6. Pronounced fatty atrophy of the right gluteal and leg muscles. No apparent spinal etiology as seen. Aortic Atherosclerosis (ICD10-I70.0). Electronically Signed   By: Nelson Chimes M.D.   On: 08/29/2022 18:00   DG Chest Port 1 View  Result Date: 08/29/2022 CLINICAL DATA:  Weakness EXAM: PORTABLE CHEST 1 VIEW COMPARISON:  CXR 08/21/22 FINDINGS: Unchanged small bilateral pleural effusions. No pneumothorax. Unchanged bibasilar atelectasis. Normal cardiac and mediastinal contours. Low lung volumes. Visualized upper abdomen is notable for gastric gaseous distention, new from prior exam. No radiographically apparent displaced rib fractures. IMPRESSION: 1. Unchanged small bilateral pleural effusions and bibasilar atelectasis. 2. Gastric gaseous distention, new from prior exam. Electronically Signed   By: Marin Roberts M.D.   On: 08/29/2022 14:21        Scheduled Meds:  amLODipine  2.5 mg Oral Daily   aspirin EC  81 mg Oral Daily   atorvastatin  20 mg Oral QPM   bisacodyl  10 mg Rectal Once   brimonidine  1 drop Both Eyes TID   cholecalciferol  400 Units Oral Daily   dorzolamide-timolol  1 drop Both Eyes BID   enoxaparin (LOVENOX) injection  30 mg Subcutaneous Q24H   insulin aspart  0-5 Units Subcutaneous QHS   insulin aspart  0-6 Units Subcutaneous TID WC   latanoprost  1 drop Both Eyes QHS   levothyroxine  88 mcg Oral QAC breakfast   memantine  10 mg Oral BID   mirtazapine  15 mg Oral QHS   Netarsudil Dimesylate  1 drop Both Eyes QHS   pantoprazole  40 mg Oral Daily   polyethylene glycol  17 g Oral Daily    senna-docusate  1 tablet Oral BID   tamsulosin  0.4 mg Oral QHS   Continuous Infusions:  cefTRIAXone (ROCEPHIN)  IV       LOS: 0 days    Time spent: 40 minutes    Irine Seal, MD [facility]   To contact the attending provider between 7A-7P or the covering provider during after hours 7P-7A, please log into the web site [email or web address] and access using universal [place] password for that web site. If you do not have the password, please call the hospital operator.  08/30/2022, 5:46 PM

## 2022-08-31 DIAGNOSIS — F03C11 Unspecified dementia, severe, with agitation: Secondary | ICD-10-CM | POA: Diagnosis not present

## 2022-08-31 DIAGNOSIS — N400 Enlarged prostate without lower urinary tract symptoms: Secondary | ICD-10-CM | POA: Diagnosis not present

## 2022-08-31 DIAGNOSIS — I959 Hypotension, unspecified: Secondary | ICD-10-CM | POA: Diagnosis not present

## 2022-08-31 DIAGNOSIS — N179 Acute kidney failure, unspecified: Secondary | ICD-10-CM | POA: Diagnosis not present

## 2022-08-31 LAB — URINE CULTURE: Culture: NO GROWTH

## 2022-08-31 LAB — CBC WITH DIFFERENTIAL/PLATELET
Abs Immature Granulocytes: 0.01 10*3/uL (ref 0.00–0.07)
Basophils Absolute: 0 10*3/uL (ref 0.0–0.1)
Basophils Relative: 1 %
Eosinophils Absolute: 0.1 10*3/uL (ref 0.0–0.5)
Eosinophils Relative: 1 %
HCT: 31.7 % — ABNORMAL LOW (ref 39.0–52.0)
Hemoglobin: 10.2 g/dL — ABNORMAL LOW (ref 13.0–17.0)
Immature Granulocytes: 0 %
Lymphocytes Relative: 35 %
Lymphs Abs: 2.1 10*3/uL (ref 0.7–4.0)
MCH: 30.4 pg (ref 26.0–34.0)
MCHC: 32.2 g/dL (ref 30.0–36.0)
MCV: 94.6 fL (ref 80.0–100.0)
Monocytes Absolute: 0.5 10*3/uL (ref 0.1–1.0)
Monocytes Relative: 7 %
Neutro Abs: 3.4 10*3/uL (ref 1.7–7.7)
Neutrophils Relative %: 56 %
Platelets: 228 10*3/uL (ref 150–400)
RBC: 3.35 MIL/uL — ABNORMAL LOW (ref 4.22–5.81)
RDW: 16.9 % — ABNORMAL HIGH (ref 11.5–15.5)
WBC: 6.1 10*3/uL (ref 4.0–10.5)
nRBC: 0 % (ref 0.0–0.2)

## 2022-08-31 LAB — GLUCOSE, CAPILLARY
Glucose-Capillary: 104 mg/dL — ABNORMAL HIGH (ref 70–99)
Glucose-Capillary: 136 mg/dL — ABNORMAL HIGH (ref 70–99)
Glucose-Capillary: 140 mg/dL — ABNORMAL HIGH (ref 70–99)
Glucose-Capillary: 194 mg/dL — ABNORMAL HIGH (ref 70–99)

## 2022-08-31 LAB — BASIC METABOLIC PANEL
Anion gap: 9 (ref 5–15)
BUN: 15 mg/dL (ref 8–23)
CO2: 21 mmol/L — ABNORMAL LOW (ref 22–32)
Calcium: 9 mg/dL (ref 8.9–10.3)
Chloride: 107 mmol/L (ref 98–111)
Creatinine, Ser: 1.21 mg/dL (ref 0.61–1.24)
GFR, Estimated: 59 mL/min — ABNORMAL LOW (ref >60)
Glucose, Bld: 127 mg/dL — ABNORMAL HIGH (ref 70–99)
Potassium: 4.7 mmol/L (ref 3.5–5.1)
Sodium: 137 mmol/L (ref 135–145)

## 2022-08-31 LAB — MAGNESIUM: Magnesium: 1.9 mg/dL (ref 1.7–2.4)

## 2022-08-31 MED ORDER — ENOXAPARIN SODIUM 40 MG/0.4ML IJ SOSY
40.0000 mg | PREFILLED_SYRINGE | INTRAMUSCULAR | Status: DC
  Administered 2022-08-31 – 2022-09-04 (×5): 40 mg via SUBCUTANEOUS
  Filled 2022-08-31 (×5): qty 0.4

## 2022-08-31 NOTE — Progress Notes (Signed)
OT Cancellation Note  Patient Details Name: Mike Mack MRN: [id number] DOB: March 19, 1936   Cancelled Treatment:    Reason Eval/Treat Not Completed: OT screened, no needs identified, will sign off. Patient is from home with wife. He is dependent for almost all ADLS though he can feed himself when sitting in hs wheelchair. His grandson helps transfer him to wheelchair daily and he can maneuver himself around in his wheelchair with his arms a bit. From a functional standpoint - he has no OT needs.   [name] 08/31/2022, 10:30 AM

## 2022-08-31 NOTE — Progress Notes (Signed)
PROGRESS NOTE    Mike Mack  [id number] DOB: 04/29/1936 DOA: 08/29/2022 PCP: [name], MD   Chief Complaint  Patient presents with   Hypotension    Brief Narrative:  Patient 87 year old gentleman history of remote polio, hypertension, type 2 diabetes, neuropathy, history of CVA, dementia, anemia, bilateral pleural effusions, BPH presented with hypotension with systolic blood pressures in the 70s while at PCPs office, and sent to the ED.  Due to dementia patient unable to provide meaningful history wife noted to admitting physician that patient recently diagnosed with UTI and started on Bactrim 08/25/2022.  Patient noted to have some complaints of dysuria which per wife first time she is hearing about on 08/30/2022.  Also concern for right great toe skin abrasion.  Patient also noted to be in acute kidney injury and urinalysis concerning for UTI.  Patient placed empirically on IV antibiotics and pancultured.   Assessment & Plan:   Active Problems:   Hypertension   BPH (benign prostatic hyperplasia)   H/O: CVA (cerebrovascular accident)   Type 2 diabetes mellitus (HCC)   Dementia (HCC)   Acute lower UTI   Pleural effusion, bilateral   AKI (acute kidney injury) ([place])   Hypotension   Hypothyroidism   Urinary tract infection without hematuria  #1 hypotension -Patient was sent from PCPs office due to systolic blood pressures in the 70s felt to be hypotensive. -Concern for infectious etiology as patient with recent UTI on Bactrim, versus secondary to hypovolemia. -Patient noted to also be on Bactrim prior to admission. -Urinalysis concerning for UTI. -Blood cultures ordered and pending. -Preliminary urine cultures with 20 K colonies of gram-negative rods, sensitivities pending.  -Blood pressure responded to IV fluids. -Continue empiric IV antibiotics. -IV fluids saline lock. -Supportive care.  2.  Acute kidney injury -Likely secondary to prerenal azotemia in the  setting of hypotension and decreased fluid intake in the setting of Bactrim which may have led to interstitial nephritis.  Patient also on losartan prior to admission. -CT renal stone protocol negative for hydronephrosis, small kidneys with simple appearing cyst did not require specific follow-up. -Renal function improved with hydration. -Continue to hold ARB. -Bactrim discontinued. -Gentle hydration. -Continue empiric IV antibiotics.  3.  UTI -Urine cultures preliminary with 20,000 colonies of gram-negative rods.   -Patient noted to have a to have been on Bactrim prior to presentation.   -Continue IV Rocephin pending finalization of urine culture results and sensitivities.  -Supportive care.  4.  Hypertension -Patient on initial presentation noted to be hypotensive with systolic blood pressures in the 70s and antihypertensive medications held. -BP responded to IV fluids, IV antibiotics.   -Patient started back on low-dose Norvasc 2.5 mg daily.  -Continue to hold ARB.  5.  Dementia -Continue home regimen Namenda and Remeron.  6.  Hypothyroidism -Synthroid.   7.  Glaucoma -Continue home regimen eyedrops.  8.  BPH -Flomax.    9.  Skin abrasion first right toe -Continue current wound care.  9.  Constipation -Patient noted with bowel movement overnight on current bowel regimen.   -Continue MiraLAX daily, Senokot S twice daily.    DVT prophylaxis: Lovenox Code Status: DNR Family Communication: Updated wife at bedside. Disposition: Likely home when clinically improved hopefully in the next 24 hours.  Status is: Observation The patient remains OBS appropriate and will d/c before 2 midnights.   Consultants:  None  Procedures:  CT renal stone protocol 08/29/2022 Chest x-ray 08/29/2022   Antimicrobials:  IV Rocephin 08/29/2022>>>>  Subjective: Patient sleeping deeply however easily arousable.  Overall states he feels well.  No chest pain.  No shortness of breath.   States improvement with flank pain.  Some improvement with dysuria.  Wife at bedside.   Objective: Vitals:   08/31/22 0126 08/31/22 0500 08/31/22 0554 08/31/22 1134  BP: 114/82  133/87 131/74  Pulse: 70  85 81  Resp: '17  18 16  '$ Temp: 97.7 F (36.5 C)  97.8 F (36.6 C) 98 F (36.7 C)  TempSrc: Oral  Oral Oral  SpO2: 100%  100% 100%  Weight:  63 kg    Height:        Intake/Output Summary (Last 24 hours) at 08/31/2022 1852 Last data filed at 08/31/2022 1200 Gross per 24 hour  Intake 540 ml  Output 300 ml  Net 240 ml    Filed Weights   08/30/22 1351 08/31/22 0500  Weight: 63 kg 63 kg    Examination:  General exam: NAD. Respiratory system: CTAB anterior lung fields.  No wheezes, no crackles, no rhonchi.  Fair air movement.  Speaking in full sentences.  Cardiovascular system: Regular rate rhythm no murmurs rubs or gallops.  No JVD.  No lower extremity edema.  Gastrointestinal system: Abdomen is soft, nontender, nondistended, positive bowel sounds.  No rebound.  No guarding.  No flank tenderness to palpation.  Central nervous system: Alert and oriented. No focal neurological deficits. Extremities: Symmetric 5 x 5 power. Skin: No rashes, lesions or ulcers Psychiatry: Judgement and insight appear normal. Mood & affect appropriate.     Data Reviewed: I have personally reviewed following labs and imaging studies  CBC: Recent Labs  Lab 08/29/22 1412 08/30/22 0346 08/31/22 0455  WBC 6.9 7.9 6.1  NEUTROABS 4.1  --  3.4  HGB 11.8* 11.5* 10.2*  HCT 36.6* 35.9* 31.7*  MCV 93.6 94.5 94.6  PLT 271 260 228     Basic Metabolic Panel: Recent Labs  Lab 08/29/22 1412 08/30/22 0346 08/31/22 0455  NA 137 136 137  K 4.9 4.9 4.7  CL 106 107 107  CO2 24 22 21*  GLUCOSE 174* 118* 127*  BUN '17 18 15  '$ CREATININE 1.61* 1.23 1.21  CALCIUM 9.0 9.2 9.0  MG  --   --  1.9     GFR: Estimated Creatinine Clearance: 39.5 mL/min (by C-G formula based on SCr of 1.21 mg/dL).  Liver  Function Tests: Recent Labs  Lab 08/29/22 1412  AST 13*  ALT 13  ALKPHOS 105  BILITOT 0.5  PROT 7.3  ALBUMIN 3.6     CBG: Recent Labs  Lab 08/30/22 1721 08/30/22 2104 08/31/22 0726 08/31/22 1131 08/31/22 1718  GLUCAP 120* 156* 104* 136* 140*      Recent Results (from the past 240 hour(s))  Culture, blood (routine x 2)     Status: None (Preliminary result)   Collection Time: 08/29/22  2:12 PM   Specimen: BLOOD RIGHT ARM  Result Value Ref Range Status   Specimen Description   Final    BLOOD RIGHT ARM Performed at [facility], [place] [address], [place], [place] [address]    Special Requests   Final    BOTTLES DRAWN AEROBIC AND ANAEROBIC Blood Culture adequate volume Performed at [facility] [address], [place], [place] [address]    Culture   Final    NO GROWTH 2 DAYS Performed at [facility] [address] [place], [place] [address]    Report  Status PENDING  Incomplete  Resp panel by RT-PCR (RSV, Flu A&B, Covid) Anterior Nasal Swab     Status: None   Collection Time: 08/29/22  2:12 PM   Specimen: Anterior Nasal Swab  Result Value Ref Range Status   SARS Coronavirus 2 by RT PCR NEGATIVE NEGATIVE Final    Comment: (NOTE) SARS-CoV-2 target nucleic acids are NOT DETECTED.  The SARS-CoV-2 RNA is generally detectable in upper respiratory specimens during the acute phase of infection. The lowest concentration of SARS-CoV-2 viral copies this assay can detect is 138 copies/mL. A negative result does not preclude SARS-Cov-2 infection and should not be used as the sole basis for treatment or other patient management decisions. A negative result may occur with  improper specimen collection/handling, submission of specimen other than nasopharyngeal swab, presence of viral mutation(s) within the areas targeted by this assay, and inadequate number of viral copies(<138 copies/mL). A negative result must be combined with clinical  observations, patient history, and epidemiological information. The expected result is Negative.  Fact Sheet for Patients:  EntrepreneurPulse.com.au  Fact Sheet for Healthcare Providers:  IncredibleEmployment.be  This test is no t yet approved or cleared by the [place] FDA and  has been authorized for detection and/or diagnosis of SARS-CoV-2 by FDA under an Emergency Use Authorization (EUA). This EUA will remain  in effect (meaning this test can be used) for the duration of the COVID-19 declaration under Section 564(b)(1) of the Act, 21 U.S.C.section 360bbb-3(b)(1), unless the authorization is terminated  or revoked sooner.       Influenza A by PCR NEGATIVE NEGATIVE Final   Influenza B by PCR NEGATIVE NEGATIVE Final    Comment: (NOTE) The Xpert Xpress SARS-CoV-2/FLU/RSV plus assay is intended as an aid in the diagnosis of influenza from Nasopharyngeal swab specimens and should not be used as a sole basis for treatment. Nasal washings and aspirates are unacceptable for Xpert Xpress SARS-CoV-2/FLU/RSV testing.  Fact Sheet for Patients: EntrepreneurPulse.com.au  Fact Sheet for Healthcare Providers: IncredibleEmployment.be  This test is not yet approved or cleared by the [place] FDA and has been authorized for detection and/or diagnosis of SARS-CoV-2 by FDA under an Emergency Use Authorization (EUA). This EUA will remain in effect (meaning this test can be used) for the duration of the COVID-19 declaration under Section 564(b)(1) of the Act, 21 U.S.C. section 360bbb-3(b)(1), unless the authorization is terminated or revoked.     Resp Syncytial Virus by PCR NEGATIVE NEGATIVE Final    Comment: (NOTE) Fact Sheet for Patients: EntrepreneurPulse.com.au  Fact Sheet for Healthcare Providers: IncredibleEmployment.be  This test is not yet approved or cleared by  the [place] FDA and has been authorized for detection and/or diagnosis of SARS-CoV-2 by FDA under an Emergency Use Authorization (EUA). This EUA will remain in effect (meaning this test can be used) for the duration of the COVID-19 declaration under Section 564(b)(1) of the Act, 21 U.S.C. section 360bbb-3(b)(1), unless the authorization is terminated or revoked.  Performed at [facility], [place] [address] [place], [place] [address]   Urine Culture     Status: None   Collection Time: 08/29/22  2:29 PM   Specimen: Urine, Random  Result Value Ref Range Status   Specimen Description   Final    URINE, RANDOM Performed at [facility] [address], [place], [place] [address]    Special Requests   Final    NONE Reflexed from [id number] Performed at [facility], [address] [place], [place] [address]    Culture   Final    NO GROWTH Performed at [facility], [place] [address], [place], [place] [address]    Report Status 08/31/2022 FINAL  Final  Urine Culture     Status: Abnormal (Preliminary result)   Collection Time: 08/30/22 10:19 PM   Specimen: Urine, Clean Catch  Result Value Ref Range Status   Specimen Description   Final    URINE, CLEAN CATCH Performed at [facility], [place] [address] [place], [place] [address]    Special Requests   Final    NONE Reflexed from [id number] Performed at [facility], [place] [address] [place], [place] [address]    Culture (A)  Final    20,000 COLONIES/mL GRAM NEGATIVE RODS IDENTIFICATION AND SUSCEPTIBILITIES TO FOLLOW Performed at [facility], [place] [address] [place], [place] [address]    Report Status PENDING  Incomplete         Radiology Studies: No results found.      Scheduled Meds:  amLODipine  2.5 mg Oral Daily   aspirin EC  81 mg Oral Daily   atorvastatin  20 mg Oral QPM   brimonidine  1 drop Both Eyes TID   cholecalciferol   400 Units Oral Daily   dorzolamide-timolol  1 drop Both Eyes BID   enoxaparin (LOVENOX) injection  40 mg Subcutaneous Q24H   insulin aspart  0-5 Units Subcutaneous QHS   insulin aspart  0-6 Units Subcutaneous TID WC   latanoprost  1 drop Both Eyes QHS   levothyroxine  88 mcg Oral QAC breakfast   memantine  10 mg Oral BID   mirtazapine  15 mg Oral QHS   Netarsudil Dimesylate  1 drop Both Eyes QHS   pantoprazole  40 mg Oral Daily   polyethylene glycol  17 g Oral Daily   senna-docusate  1 tablet Oral BID   tamsulosin  0.4 mg Oral QHS   Continuous Infusions:  cefTRIAXone (ROCEPHIN)  IV Stopped (08/30/22 2028)     LOS: 0 days    Time spent: 40 minutes    Irine Seal, MD [facility]   To contact the attending provider between 7A-7P or the covering provider during after hours 7P-7A, please log into the web site [email or web address] and access using universal [place] password for that web site. If you do not have the password, please call the hospital operator.  08/31/2022, 6:52 PM

## 2022-08-31 NOTE — Progress Notes (Addendum)
PT Cancellation Note / Screen  Patient Details Name: Mike Mack MRN: [id number] DOB: May 04, 1936   Cancelled Treatment:    Reason Eval/Treat Not Completed: PT screened, no needs identified, will sign off Per notes, pt from home with [facility] palliative services.  Pt typically requires assist for ADLs and assist from grandson for transfers to w/c (if OOB).  Pt does not appear appropriate for skilled acute PT at this time.     [name] 08/31/2022, 4:32 PM Jannette Spanner PT, DPT Physical Therapist Acute Rehabilitation Services Preferred contact method: Secure Chat Weekend Pager Only: [phone] Office: [phone]

## 2022-09-01 DIAGNOSIS — N179 Acute kidney failure, unspecified: Secondary | ICD-10-CM | POA: Diagnosis not present

## 2022-09-01 DIAGNOSIS — I959 Hypotension, unspecified: Secondary | ICD-10-CM | POA: Diagnosis not present

## 2022-09-01 DIAGNOSIS — N390 Urinary tract infection, site not specified: Secondary | ICD-10-CM | POA: Diagnosis not present

## 2022-09-01 DIAGNOSIS — B9689 Other specified bacterial agents as the cause of diseases classified elsewhere: Secondary | ICD-10-CM

## 2022-09-01 LAB — CBC
HCT: 36.7 % — ABNORMAL LOW (ref 39.0–52.0)
Hemoglobin: 10.6 g/dL — ABNORMAL LOW (ref 13.0–17.0)
MCH: 30.2 pg (ref 26.0–34.0)
MCHC: 28.9 g/dL — ABNORMAL LOW (ref 30.0–36.0)
MCV: 104.6 fL — ABNORMAL HIGH (ref 80.0–100.0)
Platelets: 219 10*3/uL (ref 150–400)
RBC: 3.51 MIL/uL — ABNORMAL LOW (ref 4.22–5.81)
RDW: 16.9 % — ABNORMAL HIGH (ref 11.5–15.5)
WBC: 5.6 10*3/uL (ref 4.0–10.5)
nRBC: 0 % (ref 0.0–0.2)

## 2022-09-01 LAB — BASIC METABOLIC PANEL
Anion gap: 10 (ref 5–15)
BUN: 14 mg/dL (ref 8–23)
CO2: 19 mmol/L — ABNORMAL LOW (ref 22–32)
Calcium: 9.1 mg/dL (ref 8.9–10.3)
Chloride: 109 mmol/L (ref 98–111)
Creatinine, Ser: 1.19 mg/dL (ref 0.61–1.24)
GFR, Estimated: >60 mL/min (ref >60)
Glucose, Bld: 137 mg/dL — ABNORMAL HIGH (ref 70–99)
Potassium: 4.3 mmol/L (ref 3.5–5.1)
Sodium: 138 mmol/L (ref 135–145)

## 2022-09-01 LAB — URINE CULTURE: Culture: 20000 — AB

## 2022-09-01 LAB — GLUCOSE, CAPILLARY
Glucose-Capillary: 116 mg/dL — ABNORMAL HIGH (ref 70–99)
Glucose-Capillary: 226 mg/dL — ABNORMAL HIGH (ref 70–99)
Glucose-Capillary: 129 mg/dL — ABNORMAL HIGH (ref 70–99)
Glucose-Capillary: 159 mg/dL — ABNORMAL HIGH (ref 70–99)

## 2022-09-01 MED ORDER — IRBESARTAN 150 MG PO TABS
150.0000 mg | ORAL_TABLET | Freq: Every day | ORAL | Status: DC
  Administered 2022-09-01: 150 mg via ORAL
  Filled 2022-09-01: qty 1

## 2022-09-01 MED ORDER — ADULT MULTIVITAMIN W/MINERALS CH
1.0000 | ORAL_TABLET | Freq: Every day | ORAL | Status: DC
  Administered 2022-09-01 – 2022-09-05 (×5): 1 via ORAL
  Filled 2022-09-01 (×5): qty 1

## 2022-09-01 MED ORDER — SODIUM CHLORIDE 0.9 % IV SOLN
1.0000 g | Freq: Two times a day (BID) | INTRAVENOUS | Status: AC
  Administered 2022-09-01 – 2022-09-04 (×8): 1 g via INTRAVENOUS
  Filled 2022-09-01 (×8): qty 20

## 2022-09-01 NOTE — Progress Notes (Signed)
Initial Nutrition Assessment  DOCUMENTATION CODES:   Not applicable  INTERVENTION:   -MVI with minerals daily -Magic cup TID with meals, each supplement provides 290 kcal and 9 grams of protein  -Liberalize diet to carb modified for wider variety of meal selections  NUTRITION DIAGNOSIS:   Increased nutrient needs related to wound healing as evidenced by estimated needs.  GOAL:   Patient will meet greater than or equal to 90% of their needs  MONITOR:   PO intake, Supplement acceptance  REASON FOR ASSESSMENT:   Malnutrition Screening Tool    ASSESSMENT:   P with history of remote polio, hypertension, diabetes mellitus 2, peripheral neuropathy, prior CVA, dementia, anemia, BPH and bilateral pleural effusions.  Pt admitted with sepsis secondary to multi-drug resistant ESBL Klebsiella UTI and AKI.   Reviewed I/O's: +370 ml x 24 hours   Per [facility] notes, pt with partial thickness skin loss to rt great toe at distal tip.   Spoke with pt wife, who reports pt has a good appetite and is eating well in the hospital. Noted meal completions 27-75%. Pt reports pt consumes 3 meals per day PTA and usually eats well. Meal consists of "whatever I fix"; per wife, pt eats a wider variety of foods and has no issues with regular textures of food.   Pt wife denies any weight loss. She is unsure of pt UBW, but reports he "looks the same" to her. Reviewed wt hx; pt has experienced a 15.3% wt loss over the past 6 months, which is significant for time frame.   Discussed importance of good meal and supplement intake to promote healing. Wife reports pt does not drink supplements at home, but is amenable to try here.   Per acute rehab notes, pt receives hospice services at home.   Medications reviewed and include vitamin D3, remeron, miralax, and senokot.   Lab Results  Component Value Date   HGBA1C 5.5 04/01/2022   PTA DM medications are none.   Labs reviewed: CBGS: 116-226 (inpatient orders  for glycemic control are 0-5 units insulin aspart daily at bedtime and 0-6 units insulin aspart TID with meals).    Diet Order:   Diet Order             Diet heart healthy/carb modified Room service appropriate? Yes; Fluid consistency: Thin  Diet effective now                   EDUCATION NEEDS:   Education needs have been addressed  Skin:  Skin Assessment: Skin Integrity Issues: Skin Integrity Issues:: Other (Comment) Stage III: - Unstageable: - Other: partial thickness skin loss to rt great toe at distal tip  Last BM:  08/31/22  Height:   Ht Readings from Last 1 Encounters:  08/30/22 5\' 5"  (1.651 m)    Weight:   Wt Readings from Last 1 Encounters:  09/01/22 66.4 kg    Ideal Body Weight:  61.8 kg  BMI:  Body mass index is 24.36 kg/m.  Estimated Nutritional Needs:   Kcal:  1800-2000  Protein:  85-100 grams  Fluid:  > 1.8 L    Loistine Chance, RD, LDN, [place] Registered Dietitian II Certified Diabetes Care and Education Specialist Please refer to [facility] for RD and/or RD on-call/weekend/after hours pager

## 2022-09-01 NOTE — Progress Notes (Signed)
Progress Note   Patient: Mike Mack [id number] DOB: 01-Dec-1935 DOA: 08/29/2022     0 DOS: the patient was seen and examined on 09/01/2022   Brief hospital course: Mike Mack is an 12 old male patient with history of remote polio, hypertension, diabetes mellitus 2, peripheral neuropathy, prior CVA, dementia, anemia, BPH and bilateral pleural effusions.  At baseline he is bedbound.  He was being evaluated at his primary care physician's office where he was found to be hypotensive with systolic blood pressures in the 70s.  He was instructed to present to the ER for evaluation.  Because of his dementia patient's wife provided the admission history.  Of note patient was recently diagnosed with an acute lower urinary tract infection was started on Bactrim on 08/25/2022.  Patient had no specific complaints such as fevers chills cough abdominal pain nausea vomiting diarrhea etc.  Wife is unclear if he may have had some burning with urination.  He apparently is chronically incontinent with urination as well.  He also has a abrasion on his right great toe.  In the ED he was afebrile, pulse 74, BP 81/56.  Respirations 18 and room air sats 100%.  He did not have any leukocytosis but it is notable that he has recently been on antibiotics.  BUN was 17 and creatinine 1.61.  LFTs were normal.  Lactic acid 1.7.  Blood cultures pending.  Urinalysis consistent with likely UTI.  CT abdomen and pelvis without any evidence of infection.  Bilateral pleural effusions which are chronic or stable.  Incidental finding of large amount of fecal matter throughout the colon but no diverticulitis or bowel obstruction.  Patient was subsequently admitted with SIRS/sepsis physiology secondary to inadequately treated UTI.  He was also found to have an acute kidney injury.  Assessment and Plan: Sepsis secondary to multidrug-resistant ESBL Klebsiella UTI  Patient recently treated for UTI in the outpatient setting with Bactrim  beginning on 3/8 Presented with hypotension and AKI on 3/14 Subsequent urine cultures positive for Klebsiella Started on Rocephin at presentation will change to meropenem and likely give a total of 5 days of antibiotics Blood cultures pending  Acute kidney injury Baseline creatinine between 1.19 and 1.22 Presenting creatinine 1.61 with normal BUN Creatinine has returned to baseline as of 3/15 after adequate IV fluid hydration Wife reports patient is eating and most importantly drinking better than prior to admission Likely influenced by preadmission utilization of Bactrim Resume preadmission ARB  Hypertension Antihypertensive medications held at presentation secondary to hypotension BP back to baseline and will resume ARB and norvasc  Dementia At baseline patient is nonverbal and bedbound Will continue home regimen of Namenda and Remeron Wife interested in pursuing condom catheter if available from home health-patient has a hospital bed at home and this was obtained through [facility]  Hypothyroidism Continue Synthroid  BPH Continue Flomax Investigate the possibility of obtaining condom catheter for use at home  Glaucoma Continue eyedrops  Skin abrasion first toe Continue wound care as ordered  Constipation Current regimen of daily MiraLAX and Senokot S twice daily        Subjective: Patient nonverbal.  Wife states he is back to baseline and is eating and drinking much better  Physical Exam: Vitals:   08/31/22 1134 08/31/22 2059 09/01/22 0500 09/01/22 0531  BP: 131/74 (!) 111/57  (!) 141/75  Pulse: 81 67  89  Resp: 16 19  17   Temp: 98 F (36.7 C) (!) 97.5 F (36.4 C)  98.2 F (36.8  C)  TempSrc: Oral Oral  Oral  SpO2: 100% 100%  100%  Weight:   66.4 kg   Height:       Constitutional: NAD, calm, comfortable Respiratory: clear to auscultation bilaterally, no wheezing, no crackles. Normal respiratory effort. No accessory muscle use.  Cardiovascular: Regular rate  and rhythm, no murmurs / rubs / gallops. No extremity edema. 2+ pedal pulses.   Abdomen: no tenderness, no masses palpated. No hepatosplenomegaly. Bowel sounds positive.  Musculoskeletal: no clubbing / cyanosis. No joint deformity upper and lower extremities. Good ROM, no contractures.  Decreased muscle tone extremity with extremity atrophy Skin: no rashes, lesions, ulcers. No induration Neurologic: CN 2-12 grossly intact. Sensation intact, Strength 2/5 x all 4 extremities.  Psychiatric: Awake but does not participate with exam  Data Reviewed:  Urine culture positive multidrug-resistant ESBL Klebsiella   Family Communication: Wife at bedside  Disposition: Status is: Observation-likely no need to change to inpatient given need for a minimum of 3 days of IV antibiotics to treat multidrug-resistant UTI The patient will require care spanning > 2 midnights and should be moved to inpatient because: Patient is now requiring IV antibiotics as above  Planned Discharge Destination: Home with Home Health    Time spent: 35 minutes   Author: Erin Hearing, NP 09/01/2022 10:16 AM  For on call review [email or web address].

## 2022-09-01 NOTE — Progress Notes (Signed)
Pharmacy Antibiotic Note  Mike Mack is a 87 y.o. male admitted on 08/29/2022 with sepsis secondary to UTI.  Pharmacy has been consulted for Meropenem dosing.  Plan: Meropenem 1g IV q12h Monitor renal function, cultures, clinical course.   Height: 5\' 5"  (165.1 cm) Weight: 66.4 kg (146 lb 6.2 oz) IBW/kg (Calculated) : 61.5  Temp (24hrs), Avg:97.9 F (36.6 C), Min:97.5 F (36.4 C), Max:98.2 F (36.8 C)  Recent Labs  Lab 08/29/22 1412 08/30/22 0346 08/31/22 0455 09/01/22 0552  WBC 6.9 7.9 6.1 5.6  CREATININE 1.61* 1.23 1.21 1.19  LATICACIDVEN 1.7  --   --   --     Estimated Creatinine Clearance: 40.2 mL/min (by C-G formula based on SCr of 1.19 mg/dL).    Allergies  Allergen Reactions   Insulin Glargine-Lixisenatide Other (See Comments) and Nausea And Vomiting    Stomach cramps   Metformin And Related Nausea And Vomiting and Other (See Comments)    Gi intolerance   Nsaids Other (See Comments)    Renal insufficiency    Antimicrobials this admission: 3/12 Ceftriaxone >> 3/15 3/15 Meropenem >>  Dose adjustments this admission: --  Microbiology results: 3/12 BCx: ngtd 3/12 UCx: NGF 3/13 UCx: 20K ESBL Klebsiella pneumoniae    Thank you for allowing pharmacy to be a part of this patient's care.   Lindell Spar, PharmD, BCPS Clinical Pharmacist 09/01/2022 10:44 AM

## 2022-09-02 DIAGNOSIS — X58XXXA Exposure to other specified factors, initial encounter: Secondary | ICD-10-CM | POA: Diagnosis present

## 2022-09-02 DIAGNOSIS — N390 Urinary tract infection, site not specified: Secondary | ICD-10-CM | POA: Diagnosis present

## 2022-09-02 DIAGNOSIS — N1831 Chronic kidney disease, stage 3a: Secondary | ICD-10-CM | POA: Diagnosis present

## 2022-09-02 DIAGNOSIS — N179 Acute kidney failure, unspecified: Secondary | ICD-10-CM | POA: Diagnosis present

## 2022-09-02 DIAGNOSIS — F03C11 Unspecified dementia, severe, with agitation: Secondary | ICD-10-CM | POA: Diagnosis not present

## 2022-09-02 DIAGNOSIS — Z79899 Other long term (current) drug therapy: Secondary | ICD-10-CM | POA: Diagnosis not present

## 2022-09-02 DIAGNOSIS — E860 Dehydration: Secondary | ICD-10-CM | POA: Diagnosis present

## 2022-09-02 DIAGNOSIS — E1122 Type 2 diabetes mellitus with diabetic chronic kidney disease: Secondary | ICD-10-CM | POA: Diagnosis present

## 2022-09-02 DIAGNOSIS — E861 Hypovolemia: Secondary | ICD-10-CM | POA: Diagnosis present

## 2022-09-02 DIAGNOSIS — E039 Hypothyroidism, unspecified: Secondary | ICD-10-CM | POA: Diagnosis present

## 2022-09-02 DIAGNOSIS — I959 Hypotension, unspecified: Secondary | ICD-10-CM | POA: Diagnosis present

## 2022-09-02 DIAGNOSIS — N400 Enlarged prostate without lower urinary tract symptoms: Secondary | ICD-10-CM | POA: Diagnosis present

## 2022-09-02 DIAGNOSIS — E119 Type 2 diabetes mellitus without complications: Secondary | ICD-10-CM | POA: Diagnosis not present

## 2022-09-02 DIAGNOSIS — B961 Klebsiella pneumoniae [K. pneumoniae] as the cause of diseases classified elsewhere: Secondary | ICD-10-CM | POA: Diagnosis present

## 2022-09-02 DIAGNOSIS — N12 Tubulo-interstitial nephritis, not specified as acute or chronic: Secondary | ICD-10-CM | POA: Diagnosis present

## 2022-09-02 DIAGNOSIS — Z66 Do not resuscitate: Secondary | ICD-10-CM | POA: Diagnosis present

## 2022-09-02 DIAGNOSIS — Z1624 Resistance to multiple antibiotics: Secondary | ICD-10-CM | POA: Diagnosis present

## 2022-09-02 DIAGNOSIS — I129 Hypertensive chronic kidney disease with stage 1 through stage 4 chronic kidney disease, or unspecified chronic kidney disease: Secondary | ICD-10-CM | POA: Diagnosis present

## 2022-09-02 DIAGNOSIS — I700 Atherosclerosis of aorta: Secondary | ICD-10-CM | POA: Diagnosis present

## 2022-09-02 DIAGNOSIS — Z1612 Extended spectrum beta lactamase (ESBL) resistance: Secondary | ICD-10-CM | POA: Diagnosis present

## 2022-09-02 DIAGNOSIS — F0390 Unspecified dementia without behavioral disturbance: Secondary | ICD-10-CM | POA: Diagnosis present

## 2022-09-02 DIAGNOSIS — H409 Unspecified glaucoma: Secondary | ICD-10-CM | POA: Diagnosis present

## 2022-09-02 DIAGNOSIS — Z79890 Hormone replacement therapy (postmenopausal): Secondary | ICD-10-CM | POA: Diagnosis not present

## 2022-09-02 DIAGNOSIS — Z1152 Encounter for screening for COVID-19: Secondary | ICD-10-CM | POA: Diagnosis not present

## 2022-09-02 DIAGNOSIS — K5900 Constipation, unspecified: Secondary | ICD-10-CM | POA: Diagnosis present

## 2022-09-02 DIAGNOSIS — E1142 Type 2 diabetes mellitus with diabetic polyneuropathy: Secondary | ICD-10-CM | POA: Diagnosis present

## 2022-09-02 DIAGNOSIS — J90 Pleural effusion, not elsewhere classified: Secondary | ICD-10-CM | POA: Diagnosis present

## 2022-09-02 LAB — GLUCOSE, CAPILLARY
Glucose-Capillary: 110 mg/dL — ABNORMAL HIGH (ref 70–99)
Glucose-Capillary: 65 mg/dL — ABNORMAL LOW (ref 70–99)
Glucose-Capillary: 98 mg/dL (ref 70–99)
Glucose-Capillary: 165 mg/dL — ABNORMAL HIGH (ref 70–99)
Glucose-Capillary: 122 mg/dL — ABNORMAL HIGH (ref 70–99)
Glucose-Capillary: 127 mg/dL — ABNORMAL HIGH (ref 70–99)

## 2022-09-02 LAB — COMPREHENSIVE METABOLIC PANEL
ALT: 11 U/L (ref 0–44)
AST: 10 U/L — ABNORMAL LOW (ref 15–41)
Albumin: 3.1 g/dL — ABNORMAL LOW (ref 3.5–5.0)
Alkaline Phosphatase: 92 U/L (ref 38–126)
Anion gap: 4 — ABNORMAL LOW (ref 5–15)
BUN: 17 mg/dL (ref 8–23)
CO2: 22 mmol/L (ref 22–32)
Calcium: 8.8 mg/dL — ABNORMAL LOW (ref 8.9–10.3)
Chloride: 107 mmol/L (ref 98–111)
Creatinine, Ser: 1.31 mg/dL — ABNORMAL HIGH (ref 0.61–1.24)
GFR, Estimated: 54 mL/min — ABNORMAL LOW (ref >60)
Glucose, Bld: 116 mg/dL — ABNORMAL HIGH (ref 70–99)
Potassium: 4.2 mmol/L (ref 3.5–5.1)
Sodium: 133 mmol/L — ABNORMAL LOW (ref 135–145)
Total Bilirubin: 0.6 mg/dL (ref 0.3–1.2)
Total Protein: 6.4 g/dL — ABNORMAL LOW (ref 6.5–8.1)

## 2022-09-02 LAB — CBC
HCT: 29.5 % — ABNORMAL LOW (ref 39.0–52.0)
Hemoglobin: 9.4 g/dL — ABNORMAL LOW (ref 13.0–17.0)
MCH: 29.9 pg (ref 26.0–34.0)
MCHC: 31.9 g/dL (ref 30.0–36.0)
MCV: 93.9 fL (ref 80.0–100.0)
Platelets: 217 10*3/uL (ref 150–400)
RBC: 3.14 MIL/uL — ABNORMAL LOW (ref 4.22–5.81)
RDW: 16.8 % — ABNORMAL HIGH (ref 11.5–15.5)
WBC: 5.8 10*3/uL (ref 4.0–10.5)
nRBC: 0 % (ref 0.0–0.2)

## 2022-09-02 MED ORDER — SODIUM CHLORIDE 0.9 % IV SOLN: INTRAVENOUS | Status: AC

## 2022-09-02 MED ORDER — AMLODIPINE BESYLATE 5 MG PO TABS
2.5000 mg | ORAL_TABLET | Freq: Every day | ORAL | Status: DC
  Administered 2022-09-03 – 2022-09-05 (×3): 2.5 mg via ORAL
  Filled 2022-09-02 (×3): qty 1

## 2022-09-02 NOTE — Progress Notes (Signed)
Assumed care of patient, agree with previous RN assessment 

## 2022-09-02 NOTE — Progress Notes (Signed)
Hypoglycemic Event  CBG: 65  Treatment: 8 oz juice/soda  Symptoms: None  Follow-up CBG: Time:0810 CBG Result:110  Possible Reasons for Event: Will review with primary RN/MD  Comments/MD notified: Primary RN informed, Hypoglycemia protocol initiated.     Liana Gerold Student RN  Laurance Flatten RN

## 2022-09-02 NOTE — Progress Notes (Signed)
PROGRESS NOTE    Mike Mack  [id number] DOB: March 29, 1936 DOA: 08/29/2022 PCP: [name], MD   Chief Complaint  Patient presents with   Hypotension    Brief Narrative:  Patient 87 year old gentleman history of remote polio, hypertension, type 2 diabetes, neuropathy, history of CVA, dementia, anemia, bilateral pleural effusions, BPH presented with hypotension with systolic blood pressures in the 70s while at PCPs office, and sent to the ED.  Due to dementia patient unable to provide meaningful history wife noted to admitting physician that patient recently diagnosed with UTI and started on Bactrim 08/25/2022.  Patient noted to have some complaints of dysuria which per wife first time she is hearing about on 08/30/2022.  Also concern for right great toe skin abrasion.  Patient also noted to be in acute kidney injury and urinalysis concerning for UTI.  Patient placed empirically on IV antibiotics and pancultured.   Assessment & Plan:   Active Problems:   Hypertension   BPH (benign prostatic hyperplasia)   H/O: CVA (cerebrovascular accident)   Type 2 diabetes mellitus (HCC)   Dementia (HCC)   Acute lower UTI   Pleural effusion, bilateral   AKI (acute kidney injury) ([place])   Hypotension   Hypothyroidism   Urinary tract infection due to ESBL Klebsiella  #1 hypotension -Patient was sent from PCPs office due to systolic blood pressures in the 70s felt to be hypotensive. -Concern for infectious etiology as patient with recent UTI on Bactrim, versus secondary to hypovolemia. -BP responded to IV fluids. -Patient noted to also be on Bactrim prior to admission. -Urinalysis concerning for UTI. -Blood cultures ordered and pending. -Urine cultures with 20,000 colonies of ESBL Klebsiella pneumonia which was multidrug-resistant.   -Patient was on IV Rocephin has been transition to IV Merrem.   -ARB was resumed 09/01/2022 however blood pressure soft/borderline again today and as such  we will discontinue ARB. -Continue IV antibiotics of Merrem. -Supportive care.  2.  Acute kidney injury -Likely secondary to prerenal azotemia in the setting of hypotension and decreased fluid intake in the setting of Bactrim which may have led to interstitial nephritis.  Patient also on losartan prior to admission. -CT renal stone protocol negative for hydronephrosis, small kidneys with simple appearing cyst did not require specific follow-up. -Renal function improved with hydration. -ARB resumed yesterday however BP is soft and as such we will discontinue ARB.  -Bactrim discontinued. -Avoid nephrotoxins. -Continue empiric IV antibiotics.  3.  ESBL Klebsiella pneumonia UTI -Urine cultures with 20,000 colonies of ESBL Klebsiella pneumonia.  -Patient noted to have a to have been on Bactrim prior to presentation.   -IV Rocephin has been transitioned to IV Merrem and would likely need a full 5-day course of IV Merrem.   -Discussed with ID.   -Supportive care.   4.  Hypertension -Patient on initial presentation noted to be hypotensive with systolic blood pressures in the 70s and antihypertensive medications held. -BP responded to IV fluids, IV antibiotics.   -Patient started back on low-dose Norvasc 2.5 mg daily.  -ARB resumed however blood pressure soft today and as such we will discontinue ARB.   5.  Dementia -Continue home regimen Namenda and Remeron.  6.  Hypothyroidism -Continue Synthroid.    7.  Glaucoma -Continue home regimen eyedrops.  8.  BPH -Continue Flomax.  9.  Skin abrasion first right toe -Continue current wound care.  9.  Constipation -Patient with bowel movements on current bowel regimen of Senokot S twice daily, MiraLAX twice daily.  DVT prophylaxis: Lovenox Code Status: DNR Family Communication: Updated wife at bedside. Disposition: Likely home once completion of IV Merrem.  Status is: Observation The patient remains OBS appropriate and will d/c  before 2 midnights.   Consultants:  Curb sided ID: Dr. Juleen China  Procedures:  CT renal stone protocol 08/29/2022 Chest x-ray 08/29/2022   Antimicrobials:  IV Rocephin 08/29/2022>>>> 09/01/2022 IV Merrem 09/01/2022>>>>> 09/06/2022   Subjective: Sitting up in bed.  Looks like if just finished eating his lunch.  Denies any chest pain.  No shortness of breath.  Denies any abdominal pain.  Denies any flank pain.  Overall states he is feeling better.  Per wife patient had bowel movement yesterday.  Wife at bedside.    Objective: Vitals:   09/01/22 2112 09/02/22 0456 09/02/22 0512 09/02/22 0739  BP: (!) 116/57 122/63  126/67  Pulse: 80 73  81  Resp: 18   17  Temp: 98.8 F (37.1 C) 98.8 F (37.1 C)  98.3 F (36.8 C)  TempSrc:  Oral  Oral  SpO2: 100% 100%  100%  Weight:   64.1 kg   Height:        Intake/Output Summary (Last 24 hours) at 09/02/2022 1157 Last data filed at 09/02/2022 0900 Gross per 24 hour  Intake 850 ml  Output 1150 ml  Net -300 ml    Filed Weights   08/31/22 0500 09/01/22 0500 09/02/22 0512  Weight: 63 kg 66.4 kg 64.1 kg    Examination:  General exam: NAD. Respiratory system: Lungs clear to auscultation bilaterally anterior lung fields.  No wheezes, no crackles, no rhonchi.  Fair air movement.  Speaking in full sentences.  Cardiovascular system: RRR no murmurs rubs or gallops.  No JVD.  No lower extremity edema.  Gastrointestinal system: Abdomen is soft, nontender, nondistended, positive bowel sounds.  No rebound.  No guarding.  No flank tenderness.   Central nervous system: Alert and oriented. No focal neurological deficits. Extremities: Symmetric 5 x 5 power. Skin: No rashes, lesions or ulcers Psychiatry: Judgement and insight appear fair. Mood & affect appropriate.     Data Reviewed: I have personally reviewed following labs and imaging studies  CBC: Recent Labs  Lab 08/29/22 1412 08/30/22 0346 08/31/22 0455 09/01/22 0552 09/02/22 0532  WBC 6.9  7.9 6.1 5.6 5.8  NEUTROABS 4.1  --  3.4  --   --   HGB 11.8* 11.5* 10.2* 10.6* 9.4*  HCT 36.6* 35.9* 31.7* 36.7* 29.5*  MCV 93.6 94.5 94.6 104.6* 93.9  PLT 271 260 228 219 217     Basic Metabolic Panel: Recent Labs  Lab 08/29/22 1412 08/30/22 0346 08/31/22 0455 09/01/22 0552 09/02/22 0532  NA 137 136 137 138 133*  K 4.9 4.9 4.7 4.3 4.2  CL 106 107 107 109 107  CO2 24 22 21* 19* 22  GLUCOSE 174* 118* 127* 137* 116*  BUN 17 18 15 14 17   CREATININE 1.61* 1.23 1.21 1.19 1.31*  CALCIUM 9.0 9.2 9.0 9.1 8.8*  MG  --   --  1.9  --   --      GFR: Estimated Creatinine Clearance: 36.5 mL/min (A) (by C-G formula based on SCr of 1.31 mg/dL (H)).  Liver Function Tests: Recent Labs  Lab 08/29/22 1412 09/02/22 0532  AST 13* 10*  ALT 13 11  ALKPHOS 105 92  BILITOT 0.5 0.6  PROT 7.3 6.4*  ALBUMIN 3.6 3.1*     CBG: Recent Labs  Lab 09/01/22 2117 09/02/22 0737 09/02/22  [id number] 09/02/22 0822 09/02/22 1125  GLUCAP 159* 98 65* 110* 165*      Recent Results (from the past 240 hour(s))  Culture, blood (routine x 2)     Status: None (Preliminary result)   Collection Time: 08/29/22  2:12 PM   Specimen: BLOOD RIGHT ARM  Result Value Ref Range Status   Specimen Description   Final    BLOOD RIGHT ARM Performed at [facility] [address] [place], [place] [address]    Special Requests   Final    BOTTLES DRAWN AEROBIC AND ANAEROBIC Blood Culture adequate volume Performed at [facility] [address], [place], [place] [address]    Culture   Final    NO GROWTH 4 DAYS Performed at [facility], [place] [address] [place], [place] [address]    Report Status PENDING  Incomplete  Resp panel by RT-PCR (RSV, Flu A&B, Covid) Anterior Nasal Swab     Status: None   Collection Time: 08/29/22  2:12 PM   Specimen: Anterior Nasal Swab  Result Value Ref Range Status   SARS Coronavirus 2 by RT PCR NEGATIVE NEGATIVE Final    Comment: (NOTE) SARS-CoV-2  target nucleic acids are NOT DETECTED.  The SARS-CoV-2 RNA is generally detectable in upper respiratory specimens during the acute phase of infection. The lowest concentration of SARS-CoV-2 viral copies this assay can detect is 138 copies/mL. A negative result does not preclude SARS-Cov-2 infection and should not be used as the sole basis for treatment or other patient management decisions. A negative result may occur with  improper specimen collection/handling, submission of specimen other than nasopharyngeal swab, presence of viral mutation(s) within the areas targeted by this assay, and inadequate number of viral copies(<138 copies/mL). A negative result must be combined with clinical observations, patient history, and epidemiological information. The expected result is Negative.  Fact Sheet for Patients:  EntrepreneurPulse.com.au  Fact Sheet for Healthcare Providers:  IncredibleEmployment.be  This test is no t yet approved or cleared by the [place] FDA and  has been authorized for detection and/or diagnosis of SARS-CoV-2 by FDA under an Emergency Use Authorization (EUA). This EUA will remain  in effect (meaning this test can be used) for the duration of the COVID-19 declaration under Section 564(b)(1) of the Act, 21 U.S.C.section 360bbb-3(b)(1), unless the authorization is terminated  or revoked sooner.       Influenza A by PCR NEGATIVE NEGATIVE Final   Influenza B by PCR NEGATIVE NEGATIVE Final    Comment: (NOTE) The Xpert Xpress SARS-CoV-2/FLU/RSV plus assay is intended as an aid in the diagnosis of influenza from Nasopharyngeal swab specimens and should not be used as a sole basis for treatment. Nasal washings and aspirates are unacceptable for Xpert Xpress SARS-CoV-2/FLU/RSV testing.  Fact Sheet for Patients: EntrepreneurPulse.com.au  Fact Sheet for Healthcare  Providers: IncredibleEmployment.be  This test is not yet approved or cleared by the [place] FDA and has been authorized for detection and/or diagnosis of SARS-CoV-2 by FDA under an Emergency Use Authorization (EUA). This EUA will remain in effect (meaning this test can be used) for the duration of the COVID-19 declaration under Section 564(b)(1) of the Act, 21 U.S.C. section 360bbb-3(b)(1), unless the authorization is terminated or revoked.     Resp Syncytial Virus by PCR NEGATIVE NEGATIVE Final    Comment: (NOTE) Fact Sheet for Patients: EntrepreneurPulse.com.au  Fact Sheet for Healthcare Providers: IncredibleEmployment.be  This test is not yet approved or cleared by  the [employer] and has been authorized for detection and/or diagnosis of SARS-CoV-2 by FDA under an Emergency Use Authorization (EUA). This EUA will remain in effect (meaning this test can be used) for the duration of the COVID-19 declaration under Section 564(b)(1) of the Act, 21 U.S.C. section 360bbb-3(b)(1), unless the authorization is terminated or revoked.  Performed at [facility], [place] [address] [place], [place] [address]   Urine Culture     Status: None   Collection Time: 08/29/22  2:29 PM   Specimen: Urine, Random  Result Value Ref Range Status   Specimen Description   Final    URINE, RANDOM Performed at [facility] [address] [place], [place] [address]    Special Requests   Final    NONE Reflexed from [id number] Performed at [facility], [place] [address], [place], [place] [address]    Culture   Final    NO GROWTH Performed at [facility], [place] [address] [place], [name] [address]    Report Status 08/31/2022 FINAL  Final  Urine Culture     Status: Abnormal   Collection Time: 08/30/22 10:19 PM   Specimen: Urine, Clean Catch  Result Value Ref Range Status    Specimen Description   Final    URINE, CLEAN CATCH Performed at [facility], [place] [address] [place],  [address]    Special Requests   Final    NONE Reflexed from [id number] Performed at [facility], [place] [address], [place],  [address]    Culture (A)  Final    20,000 COLONIES/mL KLEBSIELLA PNEUMONIAE Confirmed Extended Spectrum Beta-Lactamase Producer (ESBL).  In bloodstream infections from ESBL organisms, carbapenems are preferred over piperacillin/tazobactam. They are shown to have a lower risk of mortality.    Report Status 09/01/2022 FINAL  Final   Organism ID, Bacteria KLEBSIELLA PNEUMONIAE (A)  Final      Susceptibility   Klebsiella pneumoniae - MIC*    AMPICILLIN >=32 RESISTANT Resistant     CEFAZOLIN >=64 RESISTANT Resistant     CEFEPIME >=32 RESISTANT Resistant     CEFTRIAXONE >=64 RESISTANT Resistant     CIPROFLOXACIN 0.5 INTERMEDIATE Intermediate     GENTAMICIN <=1 SENSITIVE Sensitive     IMIPENEM 0.5 SENSITIVE Sensitive     NITROFURANTOIN 64 INTERMEDIATE Intermediate     TRIMETH/SULFA >=320 RESISTANT Resistant     AMPICILLIN/SULBACTAM >=32 RESISTANT Resistant     PIP/TAZO <=4 SENSITIVE Sensitive     * 20,000 COLONIES/mL KLEBSIELLA PNEUMONIAE         Radiology Studies: No results found.      Scheduled Meds:  [START ON 09/03/2022] amLODipine  2.5 mg Oral Daily   aspirin EC  81 mg Oral Daily   atorvastatin  20 mg Oral QPM   brimonidine  1 drop Both Eyes TID   cholecalciferol  400 Units Oral Daily   dorzolamide-timolol  1 drop Both Eyes BID   enoxaparin (LOVENOX) injection  40 mg Subcutaneous Q24H   insulin aspart  0-5 Units Subcutaneous QHS   insulin aspart  0-6 Units Subcutaneous TID WC   latanoprost  1 drop Both Eyes QHS   levothyroxine  88 mcg Oral QAC breakfast   memantine  10 mg Oral BID   mirtazapine  15 mg Oral QHS   multivitamin with minerals  1 tablet Oral Daily   Netarsudil Dimesylate  1 drop Both Eyes QHS    pantoprazole  40 mg Oral Daily   polyethylene glycol  17 g Oral Daily   senna-docusate  1 tablet Oral BID   tamsulosin  0.4 mg Oral QHS   Continuous Infusions:  sodium chloride 100 mL/hr at 09/02/22 0832   meropenem (MERREM) IV 1 g (09/02/22 1154)     LOS: 0 days    Time spent: 40 minutes    Irine Seal, MD [facility]   To contact the attending provider between 7A-7P or the covering provider during after hours 7P-7A, please log into the web site [email or web address] and access using universal [place] password for that web site. If you do not have the password, please call the hospital operator.  09/02/2022, 11:57 AM

## 2022-09-03 DIAGNOSIS — I959 Hypotension, unspecified: Secondary | ICD-10-CM | POA: Diagnosis not present

## 2022-09-03 DIAGNOSIS — N400 Enlarged prostate without lower urinary tract symptoms: Secondary | ICD-10-CM | POA: Diagnosis not present

## 2022-09-03 DIAGNOSIS — F03C11 Unspecified dementia, severe, with agitation: Secondary | ICD-10-CM | POA: Diagnosis not present

## 2022-09-03 DIAGNOSIS — N179 Acute kidney failure, unspecified: Secondary | ICD-10-CM | POA: Diagnosis not present

## 2022-09-03 LAB — BASIC METABOLIC PANEL
Anion gap: 2 — ABNORMAL LOW (ref 5–15)
BUN: 15 mg/dL (ref 8–23)
CO2: 22 mmol/L (ref 22–32)
Calcium: 8.6 mg/dL — ABNORMAL LOW (ref 8.9–10.3)
Chloride: 111 mmol/L (ref 98–111)
Creatinine, Ser: 1 mg/dL (ref 0.61–1.24)
GFR, Estimated: >60 mL/min (ref >60)
Glucose, Bld: 150 mg/dL — ABNORMAL HIGH (ref 70–99)
Potassium: 4.6 mmol/L (ref 3.5–5.1)
Sodium: 135 mmol/L (ref 135–145)

## 2022-09-03 LAB — GLUCOSE, CAPILLARY
Glucose-Capillary: 138 mg/dL — ABNORMAL HIGH (ref 70–99)
Glucose-Capillary: 181 mg/dL — ABNORMAL HIGH (ref 70–99)
Glucose-Capillary: 136 mg/dL — ABNORMAL HIGH (ref 70–99)
Glucose-Capillary: 122 mg/dL — ABNORMAL HIGH (ref 70–99)

## 2022-09-03 LAB — CBC WITH DIFFERENTIAL/PLATELET
Abs Immature Granulocytes: 0.01 10*3/uL (ref 0.00–0.07)
Basophils Absolute: 0 10*3/uL (ref 0.0–0.1)
Basophils Relative: 1 %
Eosinophils Absolute: 0.1 10*3/uL (ref 0.0–0.5)
Eosinophils Relative: 1 %
HCT: 32.1 % — ABNORMAL LOW (ref 39.0–52.0)
Hemoglobin: 10.4 g/dL — ABNORMAL LOW (ref 13.0–17.0)
Immature Granulocytes: 0 %
Lymphocytes Relative: 30 %
Lymphs Abs: 1.9 10*3/uL (ref 0.7–4.0)
MCH: 30.5 pg (ref 26.0–34.0)
MCHC: 32.4 g/dL (ref 30.0–36.0)
MCV: 94.1 fL (ref 80.0–100.0)
Monocytes Absolute: 0.4 10*3/uL (ref 0.1–1.0)
Monocytes Relative: 6 %
Neutro Abs: 3.8 10*3/uL (ref 1.7–7.7)
Neutrophils Relative %: 62 %
Platelets: 255 10*3/uL (ref 150–400)
RBC: 3.41 MIL/uL — ABNORMAL LOW (ref 4.22–5.81)
RDW: 16.9 % — ABNORMAL HIGH (ref 11.5–15.5)
WBC: 6.2 10*3/uL (ref 4.0–10.5)
nRBC: 0 % (ref 0.0–0.2)

## 2022-09-03 LAB — MAGNESIUM: Magnesium: 2 mg/dL (ref 1.7–2.4)

## 2022-09-03 LAB — CULTURE, BLOOD (ROUTINE X 2)
Culture: NO GROWTH
Special Requests: ADEQUATE

## 2022-09-03 NOTE — Plan of Care (Signed)
  Problem: Metabolic: Goal: Ability to maintain appropriate glucose levels will improve Outcome: Progressing   Problem: Nutritional: Goal: Maintenance of adequate nutrition will improve Outcome: Progressing   

## 2022-09-03 NOTE — Progress Notes (Signed)
PROGRESS NOTE    Mike Mack  [id number] DOB: 10/13/1935 DOA: 08/29/2022 PCP: [name], MD   Chief Complaint  Patient presents with   Hypotension    Brief Narrative:  Patient 87 year old gentleman history of remote polio, hypertension, type 2 diabetes, neuropathy, history of CVA, dementia, anemia, bilateral pleural effusions, BPH presented with hypotension with systolic blood pressures in the 70s while at PCPs office, and sent to the ED.  Due to dementia patient unable to provide meaningful history wife noted to admitting physician that patient recently diagnosed with UTI and started on Bactrim 08/25/2022.  Patient noted to have some complaints of dysuria which per wife first time she is hearing about on 08/30/2022.  Also concern for right great toe skin abrasion.  Patient also noted to be in acute kidney injury and urinalysis concerning for UTI.  Patient placed empirically on IV antibiotics and pancultured.   Assessment & Plan:   Active Problems:   Hypertension   BPH (benign prostatic hyperplasia)   H/O: CVA (cerebrovascular accident)   Type 2 diabetes mellitus (HCC)   Dementia (HCC)   Acute lower UTI   Pleural effusion, bilateral   AKI (acute kidney injury) ([place])   Hypotension   Hypothyroidism   Urinary tract infection due to ESBL Klebsiella  #1 hypotension -Patient was sent from PCPs office due to systolic blood pressures in the 70s felt to be hypotensive. -Concern for infectious etiology as patient with recent UTI on Bactrim, versus secondary to hypovolemia. -BP responded to IV fluids. -Patient noted to also be on Bactrim prior to admission. -Urinalysis concerning for UTI. -Blood cultures ordered and pending. -Urine cultures with 20,000 colonies of ESBL Klebsiella pneumonia which was multidrug-resistant.   -Patient was on IV Rocephin has been transitioned to IV Merrem.   -ARB was resumed 09/01/2022 however blood pressure soft/borderline on 09/02/2022 ARB  discontinued.  -Continue IV antibiotics of Merrem. -Supportive care.  2.  Acute kidney injury -Likely secondary to prerenal azotemia in the setting of hypotension and decreased fluid intake in the setting of Bactrim which may have led to interstitial nephritis.  Patient also on losartan prior to admission. -CT renal stone protocol negative for hydronephrosis, small kidneys with simple appearing cyst did not require specific follow-up. -Renal function improved with hydration. -ARB resumed on 09/01/2022, however BP is soft and as such ARB discontinued 09/02/2022.   -Bactrim discontinued. -Avoid nephrotoxins. -Continue empiric IV antibiotics.  3.  ESBL Klebsiella pneumonia UTI -Urine cultures with 20,000 colonies of ESBL Klebsiella pneumonia.  -Patient noted to have a to have been on Bactrim prior to presentation.   -IV Rocephin has been transitioned to IV Merrem and would likely need a full 5-day course of IV Merrem.   -Discussed with ID.   -Supportive care.   4.  Hypertension -Patient on initial presentation noted to be hypotensive with systolic blood pressures in the 70s and antihypertensive medications held. -BP responded to IV fluids, IV antibiotics.   -Patient started back on low-dose Norvasc 2.5 mg daily.  -ARB resumed however blood pressure soft on 09/02/2022 and as such ARB has been discontinued.   -Soft blood pressure has improved.   5.  Dementia -Continue home regimen Namenda and Remeron.  6.  Hypothyroidism -Synthroid.    7.  Glaucoma -Continue home regimen eyedrops.  8.  BPH -Flomax.  9.  Skin abrasion first right toe -Continue current wound care.    9.  Constipation -Patient with bowel movements on current bowel regimen of Senokot S  twice daily, MiraLAX twice daily.     DVT prophylaxis: Lovenox Code Status: DNR Family Communication: Updated wife at bedside. Disposition: Likely home once IV Merrem course has been completed.  Status is: Inpatient     Consultants:  Curb sided ID: Dr. Juleen China  Procedures:  CT renal stone protocol 08/29/2022 Chest x-ray 08/29/2022   Antimicrobials:  IV Rocephin 08/29/2022>>>> 09/01/2022 IV Merrem 09/01/2022>>>>> 09/06/2022   Subjective: Patient sleeping deeply.  Wife at bedside.     Objective: Vitals:   09/02/22 1344 09/02/22 2127 09/03/22 0434 09/03/22 0438  BP: (!) 105/58 (!) 148/93 (!) 154/78   Pulse: 71 79 86   Resp: 18 17 15    Temp: 98.2 F (36.8 C) 97.9 F (36.6 C) 97.6 F (36.4 C)   TempSrc: Oral Oral Oral   SpO2: 100% 100% 100%   Weight:    64.4 kg  Height:        Intake/Output Summary (Last 24 hours) at 09/03/2022 1126 Last data filed at 09/03/2022 0950 Gross per 24 hour  Intake 3512.54 ml  Output 2825 ml  Net 687.54 ml    Filed Weights   09/01/22 0500 09/02/22 0512 09/03/22 0438  Weight: 66.4 kg 64.1 kg 64.4 kg    Examination:  General exam: NAD Respiratory system: CTAB anterior lung fields.  No wheezes, no crackles, no rhonchi.  Fair air movement.  Speaking in full sentences.  Cardiovascular system: Regular rate rhythm no murmurs rubs or gallops.  No JVD.  No lower extremity edema.  Gastrointestinal system: Abdomen is soft, nontender, nondistended, positive bowel sounds.  No rebound.  No guarding.  No flank tenderness to palpation.  Central nervous system: Alert and oriented. No focal neurological deficits. Extremities: Symmetric 5 x 5 power. Skin: No rashes, lesions or ulcers Psychiatry: Judgement and insight appear fair. Mood & affect appropriate.     Data Reviewed: I have personally reviewed following labs and imaging studies  CBC: Recent Labs  Lab 08/29/22 1412 08/30/22 0346 08/31/22 0455 09/01/22 0552 09/02/22 0532 09/03/22 0852  WBC 6.9 7.9 6.1 5.6 5.8 6.2  NEUTROABS 4.1  --  3.4  --   --  3.8  HGB 11.8* 11.5* 10.2* 10.6* 9.4* 10.4*  HCT 36.6* 35.9* 31.7* 36.7* 29.5* 32.1*  MCV 93.6 94.5 94.6 104.6* 93.9 94.1  PLT 271 260 228 219 217 255      Basic Metabolic Panel: Recent Labs  Lab 08/30/22 0346 08/31/22 0455 09/01/22 0552 09/02/22 0532 09/03/22 0852  NA 136 137 138 133* 135  K 4.9 4.7 4.3 4.2 4.6  CL 107 107 109 107 111  CO2 22 21* 19* 22 22  GLUCOSE 118* 127* 137* 116* 150*  BUN 18 15 14 17 15   CREATININE 1.23 1.21 1.19 1.31* 1.00  CALCIUM 9.2 9.0 9.1 8.8* 8.6*  MG  --  1.9  --   --  2.0     GFR: Estimated Creatinine Clearance: 47.8 mL/min (by C-G formula based on SCr of 1 mg/dL).  Liver Function Tests: Recent Labs  Lab 08/29/22 1412 09/02/22 0532  AST 13* 10*  ALT 13 11  ALKPHOS 105 92  BILITOT 0.5 0.6  PROT 7.3 6.4*  ALBUMIN 3.6 3.1*     CBG: Recent Labs  Lab 09/02/22 0822 09/02/22 1125 09/02/22 1700 09/02/22 2125 09/03/22 0757  GLUCAP 110* 165* 122* 127* 138*      Recent Results (from the past 240 hour(s))  Culture, blood (routine x 2)     Status: None  Collection Time: 08/29/22  2:12 PM   Specimen: BLOOD RIGHT ARM  Result Value Ref Range Status   Specimen Description   Final    BLOOD RIGHT ARM Performed at [facility], [address] [place], [place] [address]    Special Requests   Final    BOTTLES DRAWN AEROBIC AND ANAEROBIC Blood Culture adequate volume Performed at [facility] [address], [place], [place] [address]    Culture   Final    NO GROWTH 5 DAYS Performed at [facility], [place] [address] [place], [place] [address]    Report Status 09/03/2022 FINAL  Final  Resp panel by RT-PCR (RSV, Flu A&B, Covid) Anterior Nasal Swab     Status: None   Collection Time: 08/29/22  2:12 PM   Specimen: Anterior Nasal Swab  Result Value Ref Range Status   SARS Coronavirus 2 by RT PCR NEGATIVE NEGATIVE Final    Comment: (NOTE) SARS-CoV-2 target nucleic acids are NOT DETECTED.  The SARS-CoV-2 RNA is generally detectable in upper respiratory specimens during the acute phase of infection. The lowest concentration of SARS-CoV-2 viral copies  this assay can detect is 138 copies/mL. A negative result does not preclude SARS-Cov-2 infection and should not be used as the sole basis for treatment or other patient management decisions. A negative result may occur with  improper specimen collection/handling, submission of specimen other than nasopharyngeal swab, presence of viral mutation(s) within the areas targeted by this assay, and inadequate number of viral copies(<138 copies/mL). A negative result must be combined with clinical observations, patient history, and epidemiological information. The expected result is Negative.  Fact Sheet for Patients:  EntrepreneurPulse.com.au  Fact Sheet for Healthcare Providers:  IncredibleEmployment.be  This test is no t yet approved or cleared by the [place] FDA and  has been authorized for detection and/or diagnosis of SARS-CoV-2 by FDA under an Emergency Use Authorization (EUA). This EUA will remain  in effect (meaning this test can be used) for the duration of the COVID-19 declaration under Section 564(b)(1) of the Act, 21 U.S.C.section 360bbb-3(b)(1), unless the authorization is terminated  or revoked sooner.       Influenza A by PCR NEGATIVE NEGATIVE Final   Influenza B by PCR NEGATIVE NEGATIVE Final    Comment: (NOTE) The Xpert Xpress SARS-CoV-2/FLU/RSV plus assay is intended as an aid in the diagnosis of influenza from Nasopharyngeal swab specimens and should not be used as a sole basis for treatment. Nasal washings and aspirates are unacceptable for Xpert Xpress SARS-CoV-2/FLU/RSV testing.  Fact Sheet for Patients: EntrepreneurPulse.com.au  Fact Sheet for Healthcare Providers: IncredibleEmployment.be  This test is not yet approved or cleared by the [place] FDA and has been authorized for detection and/or diagnosis of SARS-CoV-2 by FDA under an Emergency Use Authorization (EUA). This EUA  will remain in effect (meaning this test can be used) for the duration of the COVID-19 declaration under Section 564(b)(1) of the Act, 21 U.S.C. section 360bbb-3(b)(1), unless the authorization is terminated or revoked.     Resp Syncytial Virus by PCR NEGATIVE NEGATIVE Final    Comment: (NOTE) Fact Sheet for Patients: EntrepreneurPulse.com.au  Fact Sheet for Healthcare Providers: IncredibleEmployment.be  This test is not yet approved or cleared by the [place] FDA and has been authorized for detection and/or diagnosis of SARS-CoV-2 by FDA under an Emergency Use Authorization (EUA). This EUA will remain in effect (meaning this test can be used) for the duration of the  COVID-19 declaration under Section 564(b)(1) of the Act, 21 U.S.C. section 360bbb-3(b)(1), unless the authorization is terminated or revoked.  Performed at [facility], [place] [address], [place], [place] [address]   Urine Culture     Status: None   Collection Time: 08/29/22  2:29 PM   Specimen: Urine, Random  Result Value Ref Range Status   Specimen Description   Final    URINE, RANDOM Performed at [facility] [address], [place], [place] [address]    Special Requests   Final    NONE Reflexed from [id number] Performed at [facility], [place] [address] [place], [place] [address]    Culture   Final    NO GROWTH Performed at [facility], [place] [address], [place], [place] [address]    Report Status 08/31/2022 FINAL  Final  Urine Culture     Status: Abnormal   Collection Time: 08/30/22 10:19 PM   Specimen: Urine, Clean Catch  Result Value Ref Range Status   Specimen Description   Final    URINE, CLEAN CATCH Performed at [facility], [place] [address] [place], [place] [address]    Special Requests   Final    NONE Reflexed from [id number] Performed at [facility], [place] [address],  [place], [place] [address]    Culture (A)  Final    20,000 COLONIES/mL KLEBSIELLA PNEUMONIAE Confirmed Extended Spectrum Beta-Lactamase Producer (ESBL).  In bloodstream infections from ESBL organisms, carbapenems are preferred over piperacillin/tazobactam. They are shown to have a lower risk of mortality.    Report Status 09/01/2022 FINAL  Final   Organism ID, Bacteria KLEBSIELLA PNEUMONIAE (A)  Final      Susceptibility   Klebsiella pneumoniae - MIC*    AMPICILLIN >=32 RESISTANT Resistant     CEFAZOLIN >=64 RESISTANT Resistant     CEFEPIME >=32 RESISTANT Resistant     CEFTRIAXONE >=64 RESISTANT Resistant     CIPROFLOXACIN 0.5 INTERMEDIATE Intermediate     GENTAMICIN <=1 SENSITIVE Sensitive     IMIPENEM 0.5 SENSITIVE Sensitive     NITROFURANTOIN 64 INTERMEDIATE Intermediate     TRIMETH/SULFA >=320 RESISTANT Resistant     AMPICILLIN/SULBACTAM >=32 RESISTANT Resistant     PIP/TAZO <=4 SENSITIVE Sensitive     * 20,000 COLONIES/mL KLEBSIELLA PNEUMONIAE         Radiology Studies: No results found.      Scheduled Meds:  amLODipine  2.5 mg Oral Daily   aspirin EC  81 mg Oral Daily   atorvastatin  20 mg Oral QPM   brimonidine  1 drop Both Eyes TID   cholecalciferol  400 Units Oral Daily   dorzolamide-timolol  1 drop Both Eyes BID   enoxaparin (LOVENOX) injection  40 mg Subcutaneous Q24H   insulin aspart  0-5 Units Subcutaneous QHS   insulin aspart  0-6 Units Subcutaneous TID WC   latanoprost  1 drop Both Eyes QHS   levothyroxine  88 mcg Oral QAC breakfast   memantine  10 mg Oral BID   mirtazapine  15 mg Oral QHS   multivitamin with minerals  1 tablet Oral Daily   Netarsudil Dimesylate  1 drop Both Eyes QHS   pantoprazole  40 mg Oral Daily   polyethylene glycol  17 g Oral Daily   senna-docusate  1 tablet Oral BID   tamsulosin  0.4 mg Oral QHS   Continuous Infusions:  meropenem (MERREM) IV 1 g (09/02/22 2306)  LOS: 1 day    Time spent: 40 minutes    Irine Seal, MD [facility]   To contact the attending provider between 7A-7P or the covering provider during after hours 7P-7A, please log into the web site [email or web address] and access using universal [place] password for that web site. If you do not have the password, please call the hospital operator.  09/03/2022, 11:26 AM

## 2022-09-03 NOTE — Plan of Care (Signed)
  Problem: Fluid Volume: Goal: Ability to maintain a balanced intake and output will improve Outcome: Progressing   Problem: Metabolic: Goal: Ability to maintain appropriate glucose levels will improve Outcome: Progressing   Problem: Tissue Perfusion: Goal: Adequacy of tissue perfusion will improve Outcome: Progressing   

## 2022-09-04 DIAGNOSIS — N179 Acute kidney failure, unspecified: Secondary | ICD-10-CM | POA: Diagnosis not present

## 2022-09-04 DIAGNOSIS — F03C11 Unspecified dementia, severe, with agitation: Secondary | ICD-10-CM | POA: Diagnosis not present

## 2022-09-04 DIAGNOSIS — N400 Enlarged prostate without lower urinary tract symptoms: Secondary | ICD-10-CM | POA: Diagnosis not present

## 2022-09-04 DIAGNOSIS — I959 Hypotension, unspecified: Secondary | ICD-10-CM | POA: Diagnosis not present

## 2022-09-04 LAB — CBC
HCT: 28.8 % — ABNORMAL LOW (ref 39.0–52.0)
Hemoglobin: 9 g/dL — ABNORMAL LOW (ref 13.0–17.0)
MCH: 30.7 pg (ref 26.0–34.0)
MCHC: 31.3 g/dL (ref 30.0–36.0)
MCV: 98.3 fL (ref 80.0–100.0)
Platelets: 212 10*3/uL (ref 150–400)
RBC: 2.93 MIL/uL — ABNORMAL LOW (ref 4.22–5.81)
RDW: 17.3 % — ABNORMAL HIGH (ref 11.5–15.5)
WBC: 6.9 10*3/uL (ref 4.0–10.5)
nRBC: 0 % (ref 0.0–0.2)

## 2022-09-04 LAB — BASIC METABOLIC PANEL
Anion gap: 4 — ABNORMAL LOW (ref 5–15)
BUN: 15 mg/dL (ref 8–23)
CO2: 23 mmol/L (ref 22–32)
Calcium: 8.8 mg/dL — ABNORMAL LOW (ref 8.9–10.3)
Chloride: 111 mmol/L (ref 98–111)
Creatinine, Ser: 1.24 mg/dL (ref 0.61–1.24)
GFR, Estimated: 57 mL/min — ABNORMAL LOW (ref >60)
Glucose, Bld: 107 mg/dL — ABNORMAL HIGH (ref 70–99)
Potassium: 4.7 mmol/L (ref 3.5–5.1)
Sodium: 138 mmol/L (ref 135–145)

## 2022-09-04 LAB — GLUCOSE, CAPILLARY
Glucose-Capillary: 108 mg/dL — ABNORMAL HIGH (ref 70–99)
Glucose-Capillary: 116 mg/dL — ABNORMAL HIGH (ref 70–99)
Glucose-Capillary: 123 mg/dL — ABNORMAL HIGH (ref 70–99)
Glucose-Capillary: 141 mg/dL — ABNORMAL HIGH (ref 70–99)

## 2022-09-04 MED ORDER — SODIUM CHLORIDE 0.9 % IV SOLN
1.0000 g | INTRAVENOUS | Status: AC
  Administered 2022-09-05: 1000 mg via INTRAVENOUS
  Filled 2022-09-04: qty 1

## 2022-09-04 NOTE — TOC Initial Note (Signed)
Transition of Care [facility]) - Initial/Assessment Note    Patient Details  Name: Mike Mack MRN: [id number] Date of Birth: [birth year]  [facility]) CM/SW Contact:    Dessa Phi, RN Phone Number: 09/04/2022, 10:28 AM  Clinical Narrative:  spoke to spouse Ross Ludwig home hospice rep [facility] pending admission-will provide services @ d/c. Has own transport,Has own dme.                 Expected Discharge Plan: Home w Hospice Care Barriers to Discharge: Continued Medical Work up   Patient Goals and CMS Choice Patient states their goals for this hospitalization and ongoing recovery are::  (Home w/hospice) CMS Medicare.gov Compare Post Acute Care list provided to:: Patient Represenative (must comment) Choice offered to / list presented to : [place] ownership interest in [facility].provided to:: Spouse    Expected Discharge Plan and Services   Discharge Planning Services: CM Consult Post Acute Care Choice: Hospice Living arrangements for the past 2 months: Single Family Home                                      Prior Living Arrangements/Services Living arrangements for the past 2 months: Single Family Home Lives with:: Spouse   Do you feel safe going back to the place where you live?: Yes          Current home services: Hospice ([facility] home hospice rep  Jennifer-PTA pending admission)    Activities of Daily Living      Permission Sought/Granted Permission sought to share information with : Case Manager Permission granted to share information with : Yes, Verbal Permission Granted              Emotional Assessment              Admission diagnosis:  AKI (acute kidney injury) ([place]) [N17.9] Hypotension, unspecified hypotension type [I95.9] Patient Active Problem List   Diagnosis Date Noted   Hypothyroidism 08/30/2022   Urinary tract infection due to ESBL Klebsiella 08/30/2022   AKI (acute kidney injury)  ([place]) 08/29/2022   Hypotension 08/29/2022   Acute encephalopathy 08/21/2022   Glaucoma 08/08/2022   recent GI bleed in 02/2022 04/01/2022   Right tibial fracture 04/01/2022   Pressure injury of skin 03/09/2022   Pleural effusion, bilateral 10/21/2021   Malnutrition of moderate degree 02/23/2021   Acute lower UTI 02/22/2021   CKD (chronic kidney disease) stage 3, GFR 30-59 ml/min ([place]) 02/21/2021   Dementia ([place]) 11/27/2020   Polio osteopathy of lower leg, left ([place]) 11/27/2020   Subphrenic abscess ([place]) 03/19/2020   Posterior vitreous detachment of both eyes 12/11/2019   Syncope 10/25/2014   H/O: CVA (cerebrovascular accident) 10/25/2014   H/O poliomyelitis 10/25/2014   Type 2 diabetes mellitus ([place]) 10/25/2014   Tachycardia 08/29/2013   Cough 08/29/2013   Monoparesis of leg ([place]) 08/20/2013   Muscle weakness of lower extremity 08/20/2013   Right upper quadrant abdominal abscess ([place]) 08/15/2013   Hepatic abscess 08/15/2013   Subacute delirium 08/14/2013   Oral thrush 08/14/2013   Leukocytosis, unspecified 08/14/2013   Debility 08/14/2013   S/P cholecystectomy 07/29/2013   Cholecystitis with cholelithiasis 07/25/2013   Arthritis 06/06/2013   Headache 06/06/2013   Generalized weakness 05/24/2013   RUQ abdominal pain 05/13/2013   Constipation 05/13/2013   Enterococcus UTI 05/13/2013   Anemia 05/13/2013   Hypertension    BPH (  benign prostatic hyperplasia)    Pruritic disorder 02/02/2011   PCP:  [name], MD Pharmacy:   [id number] - [place], [place] [address] Phone: [phone] Fax: [phone]  [facility] [address] [place] [address] Phone: [phone] Fax: [phone]     Social Determinants of Health (SDOH) Social History: SDOH Screenings   Food Insecurity: No Food Insecurity (08/21/2022)  Housing: Low Risk  (08/21/2022)  Transportation  Needs: No Transportation Needs (08/21/2022)  Utilities: Not At Risk (08/21/2022)  Tobacco Use: Low Risk  (08/29/2022)   SDOH Interventions:     Readmission Risk Interventions    08/24/2022   10:47 AM  Readmission Risk Prevention Plan  Transportation Screening Complete  PCP or Specialist Appt within 3-5 Days Complete  HRI or [place] Complete  Social Work Consult for [place] Planning/Counseling Complete  Palliative Care Screening Not Applicable  Medication Review Press photographer) Referral to Pharmacy

## 2022-09-04 NOTE — Progress Notes (Signed)
PROGRESS NOTE    Mike Mack  [id number] DOB: [birth year] DOA: 08/29/2022 PCP: [name], MD   Chief Complaint  Patient presents with   Hypotension    Brief Narrative:  Patient 87 year old gentleman history of remote polio, hypertension, type 2 diabetes, neuropathy, history of CVA, dementia, anemia, bilateral pleural effusions, BPH presented with hypotension with systolic blood pressures in the 70s while at PCPs office, and sent to the ED.  Due to dementia patient unable to provide meaningful history wife noted to admitting physician that patient recently diagnosed with UTI and started on Bactrim 08/25/2022.  Patient noted to have some complaints of dysuria which per wife first time she is hearing about on 08/30/2022.  Also concern for right great toe skin abrasion.  Patient also noted to be in acute kidney injury and urinalysis concerning for UTI.  Patient placed empirically on IV antibiotics and pancultured.   Assessment & Plan:   Active Problems:   Hypertension   BPH (benign prostatic hyperplasia)   H/O: CVA (cerebrovascular accident)   Type 2 diabetes mellitus (HCC)   Dementia (HCC)   Acute lower UTI   Pleural effusion, bilateral   AKI (acute kidney injury) ([place])   Hypotension   Hypothyroidism   Urinary tract infection due to ESBL Klebsiella  #1 hypotension -Patient was sent from PCPs office due to systolic blood pressures in the 70s felt to be hypotensive. -Concern for infectious etiology as patient with recent UTI on Bactrim, versus secondary to hypovolemia. -BP responded to IV fluids. -Patient noted to also be on Bactrim prior to admission. -Urinalysis concerning for UTI. -Blood cultures ordered and pending. -Urine cultures with 20,000 colonies of ESBL Klebsiella pneumonia which was multidrug-resistant.   -Patient was on IV Rocephin has been transitioned to IV Merrem.   -ARB was resumed 09/01/2022 however blood pressure soft/borderline on 09/02/2022 ARB  discontinued.  -Continue IV antibiotics of Merrem. -Supportive care.  2.  Acute kidney injury -Likely secondary to prerenal azotemia in the setting of hypotension and decreased fluid intake in the setting of Bactrim which may have led to interstitial nephritis.  Patient also on losartan prior to admission. -CT renal stone protocol negative for hydronephrosis, small kidneys with simple appearing cyst did not require specific follow-up. -Renal function improved with hydration. -ARB resumed on 09/01/2022, however BP is soft and as such ARB discontinued 09/02/2022.   -Bactrim discontinued. -Avoid nephrotoxins. -Continue empiric IV antibiotics.  3.  ESBL Klebsiella pneumonia UTI -Urine cultures with 20,000 colonies of ESBL Klebsiella pneumonia.  -Patient noted to have a to have been on Bactrim prior to presentation.   -IV Rocephin has been transitioned to IV Merrem and would likely need a full 5-day course of IV Merrem.   -Patient to complete 4 days of IV Merrem today and received a dose of Invanz tomorrow 09/05/2022 which will complete a 5-day course of treatment. -Discussed with ID.   -Supportive care.   4.  Hypertension -Patient on initial presentation noted to be hypotensive with systolic blood pressures in the 70s and antihypertensive medications held. -BP responded to IV fluids, IV antibiotics.   -Patient started back on low-dose Norvasc 2.5 mg daily.  -ARB resumed however blood pressure soft on 09/02/2022 and as such ARB has been discontinued.   -Soft blood pressure has improved.   5.  Dementia -Continue Namenda, Remeron.   6.  Hypothyroidism -Synthroid.  7.  Glaucoma -Continue home regimen eyedrops.   8.  BPH -Continue Flomax.   9.  Skin abrasion  first right toe -Continue current wound care.    9.  Constipation -Patient with bowel movements on current bowel regimen of Senokot S twice daily, MiraLAX twice daily.     DVT prophylaxis: Lovenox Code Status: DNR Family  Communication: Updated wife at bedside. Disposition: Likely home once IV Merrem course has been completed.  Status is: Inpatient    Consultants:  Curb sided ID: Dr. Juleen China  Procedures:  CT renal stone protocol 08/29/2022 Chest x-ray 08/29/2022   Antimicrobials:  IV Rocephin 08/29/2022>>>> 09/01/2022 IV Merrem 09/01/2022>>>>> 09/04/2022 IV Invanz 3/19 /2024 x 1 dose   Subjective: Laying in bed.  No chest pain.  No shortness of breath.  No abdominal pain.  Wife at bedside.    Objective: Vitals:   09/03/22 0438 09/03/22 1338 09/03/22 2124 09/04/22 0620  BP:  107/62 (!) 142/75 104/69  Pulse:  87 81 70  Resp:  15 18 16   Temp:  97.6 F (36.4 C) 98.8 F (37.1 C) 98 F (36.7 C)  TempSrc:  Oral Oral   SpO2:  100% 100% 100%  Weight: 64.4 kg   69.3 kg  Height:        Intake/Output Summary (Last 24 hours) at 09/04/2022 1138 Last data filed at 09/04/2022 0900 Gross per 24 hour  Intake 480 ml  Output 1000 ml  Net -520 ml    Filed Weights   09/02/22 0512 09/03/22 0438 09/04/22 0620  Weight: 64.1 kg 64.4 kg 69.3 kg    Examination:  General exam: NAD Respiratory system: Lungs clear to auscultation bilaterally anterior lung fields.  No wheezes, no crackles, no rhonchi.  Fair air movement.  Speaking in full sentences.  Cardiovascular system: RRR no murmurs rubs or gallops.  No JVD.  No lower extremity edema.  Gastrointestinal system: Abdomen is soft, nontender, nondistended, positive bowel sounds.  No rebound.  No guarding.  No CVA tenderness.  Central nervous system: Alert and oriented. No focal neurological deficits. Extremities: Symmetric 5 x 5 power. Skin: No rashes, lesions or ulcers Psychiatry: Judgement and insight appear fair. Mood & affect appropriate.     Data Reviewed: I have personally reviewed following labs and imaging studies  CBC: Recent Labs  Lab 08/29/22 1412 08/30/22 0346 08/31/22 0455 09/01/22 0552 09/02/22 0532 09/03/22 0852 09/04/22 0518  WBC  6.9   < > 6.1 5.6 5.8 6.2 6.9  NEUTROABS 4.1  --  3.4  --   --  3.8  --   HGB 11.8*   < > 10.2* 10.6* 9.4* 10.4* 9.0*  HCT 36.6*   < > 31.7* 36.7* 29.5* 32.1* 28.8*  MCV 93.6   < > 94.6 104.6* 93.9 94.1 98.3  PLT 271   < > 228 219 217 255 212   < > = values in this interval not displayed.     Basic Metabolic Panel: Recent Labs  Lab 08/31/22 0455 09/01/22 0552 09/02/22 0532 09/03/22 0852 09/04/22 0518  NA 137 138 133* 135 138  K 4.7 4.3 4.2 4.6 4.7  CL 107 109 107 111 111  CO2 21* 19* 22 22 23   GLUCOSE 127* 137* 116* 150* 107*  BUN 15 14 17 15 15   CREATININE 1.21 1.19 1.31* 1.00 1.24  CALCIUM 9.0 9.1 8.8* 8.6* 8.8*  MG 1.9  --   --  2.0  --      GFR: Estimated Creatinine Clearance: 38.6 mL/min (by C-G formula based on SCr of 1.24 mg/dL).  Liver Function Tests: Recent Labs  Lab 08/29/22 1412  09/02/22 0532  AST 13* 10*  ALT 13 11  ALKPHOS 105 92  BILITOT 0.5 0.6  PROT 7.3 6.4*  ALBUMIN 3.6 3.1*     CBG: Recent Labs  Lab 09/03/22 0757 09/03/22 1213 09/03/22 1657 09/03/22 2118 09/04/22 0724  GLUCAP 138* 181* 136* 122* 108*      Recent Results (from the past 240 hour(s))  Culture, blood (routine x 2)     Status: None   Collection Time: 08/29/22  2:12 PM   Specimen: BLOOD RIGHT ARM  Result Value Ref Range Status   Specimen Description   Final    BLOOD RIGHT ARM Performed at [facility] [address], [place], [place] [address]    Special Requests   Final    BOTTLES DRAWN AEROBIC AND ANAEROBIC Blood Culture adequate volume Performed at [facility] [address], [place], [place] [address]    Culture   Final    NO GROWTH 5 DAYS Performed at [facility], [place] [address], [place],  [address]    Report Status 09/03/2022 FINAL  Final  Resp panel by RT-PCR (RSV, Flu A&B, Covid) Anterior Nasal Swab     Status: None   Collection Time: 08/29/22  2:12 PM   Specimen: Anterior Nasal Swab  Result Value Ref Range  Status   SARS Coronavirus 2 by RT PCR NEGATIVE NEGATIVE Final    Comment: (NOTE) SARS-CoV-2 target nucleic acids are NOT DETECTED.  The SARS-CoV-2 RNA is generally detectable in upper respiratory specimens during the acute phase of infection. The lowest concentration of SARS-CoV-2 viral copies this assay can detect is 138 copies/mL. A negative result does not preclude SARS-Cov-2 infection and should not be used as the sole basis for treatment or other patient management decisions. A negative result may occur with  improper specimen collection/handling, submission of specimen other than nasopharyngeal swab, presence of viral mutation(s) within the areas targeted by this assay, and inadequate number of viral copies(<138 copies/mL). A negative result must be combined with clinical observations, patient history, and epidemiological information. The expected result is Negative.  Fact Sheet for Patients:  EntrepreneurPulse.com.au  Fact Sheet for Healthcare Providers:  IncredibleEmployment.be  This test is no t yet approved or cleared by the [place] FDA and  has been authorized for detection and/or diagnosis of SARS-CoV-2 by FDA under an Emergency Use Authorization (EUA). This EUA will remain  in effect (meaning this test can be used) for the duration of the COVID-19 declaration under Section 564(b)(1) of the Act, 21 U.S.C.section 360bbb-3(b)(1), unless the authorization is terminated  or revoked sooner.       Influenza A by PCR NEGATIVE NEGATIVE Final   Influenza B by PCR NEGATIVE NEGATIVE Final    Comment: (NOTE) The Xpert Xpress SARS-CoV-2/FLU/RSV plus assay is intended as an aid in the diagnosis of influenza from Nasopharyngeal swab specimens and should not be used as a sole basis for treatment. Nasal washings and aspirates are unacceptable for Xpert Xpress SARS-CoV-2/FLU/RSV testing.  Fact Sheet for  Patients: EntrepreneurPulse.com.au  Fact Sheet for Healthcare Providers: IncredibleEmployment.be  This test is not yet approved or cleared by the [place] FDA and has been authorized for detection and/or diagnosis of SARS-CoV-2 by FDA under an Emergency Use Authorization (EUA). This EUA will remain in effect (meaning this test can be used) for the duration of the COVID-19 declaration under Section 564(b)(1) of the Act, 21 U.S.C. section 360bbb-3(b)(1), unless the authorization is terminated or revoked.  Resp Syncytial Virus by PCR NEGATIVE NEGATIVE Final    Comment: (NOTE) Fact Sheet for Patients: EntrepreneurPulse.com.au  Fact Sheet for Healthcare Providers: IncredibleEmployment.be  This test is not yet approved or cleared by the [place] FDA and has been authorized for detection and/or diagnosis of SARS-CoV-2 by FDA under an Emergency Use Authorization (EUA). This EUA will remain in effect (meaning this test can be used) for the duration of the COVID-19 declaration under Section 564(b)(1) of the Act, 21 U.S.C. section 360bbb-3(b)(1), unless the authorization is terminated or revoked.  Performed at [facility], [place] [address] [place], [place] [address]   Urine Culture     Status: None   Collection Time: 08/29/22  2:29 PM   Specimen: Urine, Random  Result Value Ref Range Status   Specimen Description   Final    URINE, RANDOM Performed at [facility] [address] [place], [place] [address]    Special Requests   Final    NONE Reflexed from [id number] Performed at [facility], [place] [address] [place], [place] [address]    Culture   Final    NO GROWTH Performed at [facility], [place] [address] [place], [place] [address]    Report Status 08/31/2022 FINAL  Final  Urine Culture     Status: Abnormal   Collection Time:  08/30/22 10:19 PM   Specimen: Urine, Clean Catch  Result Value Ref Range Status   Specimen Description   Final    URINE, CLEAN CATCH Performed at [facility], [place] [address] [place], [place] [address]    Special Requests   Final    NONE Reflexed from [id number] Performed at [facility], [place] [address] [place],  [address]    Culture (A)  Final    20,000 COLONIES/mL KLEBSIELLA PNEUMONIAE Confirmed Extended Spectrum Beta-Lactamase Producer (ESBL).  In bloodstream infections from ESBL organisms, carbapenems are preferred over piperacillin/tazobactam. They are shown to have a lower risk of mortality.    Report Status 09/01/2022 FINAL  Final   Organism ID, Bacteria KLEBSIELLA PNEUMONIAE (A)  Final      Susceptibility   Klebsiella pneumoniae - MIC*    AMPICILLIN >=32 RESISTANT Resistant     CEFAZOLIN >=64 RESISTANT Resistant     CEFEPIME >=32 RESISTANT Resistant     CEFTRIAXONE >=64 RESISTANT Resistant     CIPROFLOXACIN 0.5 INTERMEDIATE Intermediate     GENTAMICIN <=1 SENSITIVE Sensitive     IMIPENEM 0.5 SENSITIVE Sensitive     NITROFURANTOIN 64 INTERMEDIATE Intermediate     TRIMETH/SULFA >=320 RESISTANT Resistant     AMPICILLIN/SULBACTAM >=32 RESISTANT Resistant     PIP/TAZO <=4 SENSITIVE Sensitive     * 20,000 COLONIES/mL KLEBSIELLA PNEUMONIAE         Radiology Studies: No results found.      Scheduled Meds:  amLODipine  2.5 mg Oral Daily   aspirin EC  81 mg Oral Daily   atorvastatin  20 mg Oral QPM   brimonidine  1 drop Both Eyes TID   cholecalciferol  400 Units Oral Daily   dorzolamide-timolol  1 drop Both Eyes BID   enoxaparin (LOVENOX) injection  40 mg Subcutaneous Q24H   insulin aspart  0-5 Units Subcutaneous QHS   insulin aspart  0-6 Units Subcutaneous TID WC   latanoprost  1 drop Both Eyes QHS   levothyroxine  88 mcg Oral QAC breakfast   memantine  10 mg Oral BID  mirtazapine  15 mg Oral QHS   multivitamin with minerals  1  tablet Oral Daily   Netarsudil Dimesylate  1 drop Both Eyes QHS   pantoprazole  40 mg Oral Daily   polyethylene glycol  17 g Oral Daily   senna-docusate  1 tablet Oral BID   tamsulosin  0.4 mg Oral QHS   Continuous Infusions:  [START ON 09/05/2022] ertapenem     meropenem (MERREM) IV 1 g (09/04/22 1103)     LOS: 2 days    Time spent: 35 minutes    Irine Seal, MD [facility]   To contact the attending provider between 7A-7P or the covering provider during after hours 7P-7A, please log into the web site [email or web address] and access using universal [place] password for that web site. If you do not have the password, please call the hospital operator.  09/04/2022, 11:38 AM

## 2022-09-05 DIAGNOSIS — N400 Enlarged prostate without lower urinary tract symptoms: Secondary | ICD-10-CM | POA: Diagnosis not present

## 2022-09-05 DIAGNOSIS — Z8673 Personal history of transient ischemic attack (TIA), and cerebral infarction without residual deficits: Secondary | ICD-10-CM

## 2022-09-05 DIAGNOSIS — N179 Acute kidney failure, unspecified: Secondary | ICD-10-CM | POA: Diagnosis not present

## 2022-09-05 DIAGNOSIS — E119 Type 2 diabetes mellitus without complications: Secondary | ICD-10-CM

## 2022-09-05 DIAGNOSIS — I959 Hypotension, unspecified: Secondary | ICD-10-CM | POA: Diagnosis not present

## 2022-09-05 LAB — GLUCOSE, CAPILLARY
Glucose-Capillary: 105 mg/dL — ABNORMAL HIGH (ref 70–99)
Glucose-Capillary: 145 mg/dL — ABNORMAL HIGH (ref 70–99)

## 2022-09-05 LAB — BASIC METABOLIC PANEL
Anion gap: 6 (ref 5–15)
BUN: 19 mg/dL (ref 8–23)
CO2: 23 mmol/L (ref 22–32)
Calcium: 8.7 mg/dL — ABNORMAL LOW (ref 8.9–10.3)
Chloride: 110 mmol/L (ref 98–111)
Creatinine, Ser: 1.28 mg/dL — ABNORMAL HIGH (ref 0.61–1.24)
GFR, Estimated: 55 mL/min — ABNORMAL LOW (ref >60)
Glucose, Bld: 120 mg/dL — ABNORMAL HIGH (ref 70–99)
Potassium: 4.8 mmol/L (ref 3.5–5.1)
Sodium: 139 mmol/L (ref 135–145)

## 2022-09-05 MED ORDER — SENNOSIDES-DOCUSATE SODIUM 8.6-50 MG PO TABS: 1.0000 | ORAL_TABLET | Freq: Two times a day (BID) | ORAL | 0 refills | Status: DC

## 2022-09-05 MED ORDER — POLYETHYLENE GLYCOL 3350 17 G PO PACK: 17.0000 g | PACK | Freq: Every day | ORAL | 1 refills | Status: AC

## 2022-09-05 NOTE — TOC Transition Note (Signed)
Transition of Care [facility]) - CM/SW Discharge Note   Patient Details  Name: Mike Mack MRN: [id number] Date of Birth: Jan 19, 1936  [facility]) CM/SW Contact:  Dessa Phi, RN Phone Number: 09/05/2022, 2:54 PM   Clinical Narrative: Confirmed w/spouse Dorothy can d/c home to follow with [facility] home hospice services. Patient already has hospital bed,w/c. Dorothy with concerns about hoyer lift-[facility] will address once home @ home visit. Dorothy agree to d/c home without hoyer lift since grandson will asst w/transfer until [facility] available. PTAR for transport home. No further CM needs.      Final next level of care: Home w Hospice Care Barriers to Discharge: No Barriers Identified   Patient Goals and CMS Choice CMS Medicare.gov Compare Post Acute Care list provided to:: Patient Represenative (must comment) Choice offered to / list presented to : Spouse  Discharge Placement                  Patient to be transferred to facility by:  Corey Harold) Name of family member notified:  (Dorothy(spouse)) Patient and family notified of of transfer: 09/05/22  Discharge Plan and Services Additional resources added to the After Visit Summary for     Discharge Planning Services: CM Consult Post Acute Care Choice: Hospice                    HH Arranged: RN [facility] Agency: Other - See comment Kenny[facility] Lose hospice rep Anderson [place].) Date [facility] Agency Contacted: 09/05/22 Time HH Agency Contacted: [phone] Representative spoke with at [place]: [place] Determinants of Health ([place]) Interventions SDOH Screenings   Food Insecurity: No Food Insecurity (08/21/2022)  Housing: Low Risk  (08/21/2022)  Transportation Needs: No Transportation Needs (08/21/2022)  Utilities: Not At Risk (08/21/2022)  Tobacco Use: Low Risk  (08/29/2022)     Readmission Risk Interventions    09/04/2022   10:29 AM 08/24/2022   10:47 AM  Readmission Risk Prevention Plan  Transportation Screening Complete  Complete  PCP or Specialist Appt within 3-5 Days  Complete  HRI or [place]  Complete  Social Work Consult for [place] Planning/Counseling  [place] Screening  Not Applicable  Medication Review Press photographer) Complete Referral to Pharmacy  PCP or Specialist appointment within 3-5 days of discharge Complete   HRI or [place] Complete   SW Recovery Care/Counseling Consult Complete   [place] Not Applicable

## 2022-09-05 NOTE — Progress Notes (Addendum)
Patient can not stand, even with assistance. He will require transportation to home.   Wife is agreeable.   MD notified.

## 2022-09-05 NOTE — Progress Notes (Signed)
PTAR at bedside, VSS. Denies any pain.

## 2022-09-05 NOTE — Progress Notes (Signed)
AVS instructions were reviewed with patient and wife at bedside. All questions were answered, all personal belongings were returned.

## 2022-09-05 NOTE — Discharge Summary (Addendum)
Physician Discharge Summary  Mike Mack [id number] DOB: 1935-12-24 DOA: 08/29/2022  PCP: [name], MD  Admit date: 08/29/2022 Discharge date: 09/05/2022  Time spent: 60 minutes  Recommendations for Outpatient Follow-up:  Follow-up with [name], MD in 2 weeks.  On follow-up patient will need a basic metabolic profile done to follow-up on electrolytes and renal function.  Patient blood pressure need to be reassessed as patient's ARB was discontinued on discharge. Patient being discharged home with hospice.   Discharge Diagnoses:  Principal Problem:   Hypotension Active Problems:   Urinary tract infection due to ESBL Klebsiella   AKI (acute kidney injury) (HCC)   Hypertension   BPH (benign prostatic hyperplasia)   H/O: CVA (cerebrovascular accident)   Type 2 diabetes mellitus (Mike Mack)   Dementia (Mike Mack)   Acute lower UTI   Pleural effusion, bilateral   Hypothyroidism   Discharge Condition: Stable and improved.  Diet recommendation: Carb modified diet  Filed Weights   09/03/22 0438 09/04/22 0620 09/05/22 0440  Weight: 64.4 kg 69.3 kg 68.4 kg    History of present illness:  HPI per Dr. Marshia Ly [name] is a 87 y.o. male with medical history significant of remote polio,  hypertension, Dm2, neuropathy,  h/o CVA, dementia, anemia, bilateral pleural effusions, BPH presents with  hypotension, sbp 70's while at [facility] earlier today. Pt was told to go to ER for evaluation.  Pt  is unable to provide meaningful history due to dementia.  His wife notes that he was recently diagnosed with acute lower UTI and started on Bactrim on 08/25/22.  She notes he has not complained of any fever, chills, cough, cp, palp, sob, n/v, abd pain, diarrhea, brbpr, black stool, hematuria.  He may or may not have had some c/o burning with urination.  She also notes that his right great toe has a skin abrasion.,    In ED, T 98.4, P 74  R 18, Bp 81/56  pox 100% on RA   Wbc 6.9, Hgb 11.8,  Plt 271 Na 137, K 4.9, Bun 17, Creat 1.61, Glucose 174 Ast 13, Alt 13 Lactic acid 1.7 Blood culture x2 pending Urinalysis wbc >50, rbc >50     CXR  IMPRESSION: 1. Unchanged small bilateral pleural effusions and bibasilar atelectasis. 2. Gastric gaseous distention, new from prior exam.   CT abd/ pelvis  IMPRESSION: 1. No acute finding to explain the clinical presentation. No evidence of urinary tract stone disease or hydronephrosis. Small kidneys with simple appearing cysts that do not require specific follow-up. 2. Chronic bilateral pleural effusions larger on the right than the left with mild dependent atelectasis. Elevation of the left hemidiaphragm. 3. Large amount of fecal matter throughout the colon which could be symptomatic. No evidence of diverticulitis or other acute bowel finding. 4. Aortic atherosclerosis. 5. Markedly enlarged prostate gland. 6. Pronounced fatty atrophy of the right gluteal and leg muscles. No apparent spinal etiology as seen.   Pt admitted for AKI, hypotension and acute lower uti.    Hospital Course:  #1 hypotension -Patient was sent from PCPs office due to systolic blood pressures in the 70s. -Concern for infectious etiology as patient with recent UTI on Bactrim, versus secondary to hypovolemia. -BP responded to IV fluids. -Patient noted to also be on Bactrim prior to admission. -Urinalysis concerning for UTI. -Blood cultures and negative x 5 days.   -Urine cultures with 20,000 colonies of ESBL Klebsiella pneumonia which was multidrug-resistant.   -Patient was on IV Rocephin subsequently transitioned to  IV Merrem.   -ARB was resumed 09/01/2022 however blood pressure soft/borderline on 09/02/2022 ARB discontinued.  -Patient's home dose Norvasc was resumed, blood pressure remained stable.   -Patient received a full 5-day course of IV antibiotics during this hospitalization.   -Outpatient follow-up with PCP.    2.  Acute kidney  injury -Likely secondary to prerenal azotemia in the setting of hypotension and decreased fluid intake in the setting of Bactrim which may have led to interstitial nephritis.  Patient also on ARB prior to admission. -CT renal stone protocol negative for hydronephrosis, small kidneys with simple appearing cyst did not require specific follow-up. -Renal function improved with hydration. -Patient with good urine output during the hospitalization. -ARB resumed on 09/01/2022, however BP is soft and as such ARB discontinued 09/02/2022 and will not be resumed on discharge.   -Bactrim discontinued. -Patient received full course of IV antibiotics for UTI.  -Renal function improved.  -Outpatient follow-up with PCP.    3.  ESBL Klebsiella pneumonia UTI -Urine cultures with 20,000 colonies of ESBL Klebsiella pneumonia.  -Patient noted to have a to have been on Bactrim prior to presentation.   -Patient initially placed on IV Rocephin while urine cultures were pending, once urine cultures had resulted patient was subsequently transitioned to IV Merrem and goal was a 5-day course of treatment with discussion with ID.   -Patient completed 4 days of IV Merrem and subsequently received a dose of IV Invanz on day of discharge to complete a 5-day course of treatment.   -No further antibiotics needed on discharge.   -Outpatient follow-up with PCP.    4.  Hypertension -Patient on initial presentation noted to be hypotensive with systolic blood pressures in the 70s and antihypertensive medications held. -BP responded to IV fluids, IV antibiotics.   -Patient started back on low-dose Norvasc 2.5 mg daily.  -ARB resumed however blood pressure soft on 09/02/2022 and as such ARB has been discontinued and will not be resumed on discharge. -Blood pressure remained stable. -Outpatient follow-up with PCP.   5.  Dementia -Patient remained stable during the hospitalization.  -Patient maintained on home regimen Namenda,  Remeron.    6.  Hypothyroidism -Patient maintained on home regimen Synthroid.   7.  Glaucoma -Patient maintained on home regimen eyedrops.    8.  BPH -Patient maintained on home regimen Flomax.    9.  Skin abrasion first right toe -Patient seen by wound care and patient noted to have a skin abrasion on the first right toe, dressing changes recommended which patient will be discharged on.    9.  Constipation -Patient with bowel movements on bowel regimen of Senokot S twice daily, MiraLAX daily.   -Patient was discharged on bowel regimen outpatient follow-up with PCP.    Procedures: CT renal stone protocol 08/29/2022 Chest x-ray 08/29/2022  Consultations: Curb sided ID: Dr. Juleen China   Discharge Exam: Vitals:   09/05/22 0440 09/05/22 1300  BP: 123/72 128/62  Pulse: 70 79  Resp: 19 18  Temp: 98.9 F (37.2 C) 98.6 F (37 C)  SpO2: 100% 100%    General: NAD Cardiovascular: Regular rate rhythm no murmurs rubs or gallops.  No JVD.  No lower extremity edema. Respiratory: Clear to auscultation bilaterally anterior lung fields.  Discharge Instructions   Discharge Instructions     Diet Carb Modified   Complete by: As directed    Discharge wound care:   Complete by: As directed    Wound care  Daily  Comments: Wound care to distal tip or right great toe: Cleanse with NS, pat dry. Paint with povidone-iodine swabstick, allow to dry, top with dry gauze 2x2 and secure with conform bandaging/paper tape.  08/30/22 0509   Increase activity slowly   Complete by: As directed       Allergies as of 09/05/2022       Reactions   Insulin Glargine-lixisenatide Other (See Comments), Nausea And Vomiting   Stomach cramps   Metformin And Related Nausea And Vomiting, Other (See Comments)   Gi intolerance   Nsaids Other (See Comments)   Renal insufficiency        Medication List     STOP taking these medications    olmesartan 20 MG tablet Commonly known as: BENICAR    sulfamethoxazole-trimethoprim 800-160 MG tablet Commonly known as: BACTRIM DS       TAKE these medications    acetaminophen 325 MG tablet Commonly known as: TYLENOL Take 2 tablets (650 mg total) by mouth every 6 (six) hours as needed for mild pain (or Fever >/= 101).   amLODipine 2.5 MG tablet Commonly known as: NORVASC Take 1 tablet (2.5 mg total) by mouth daily.   aspirin EC 81 MG tablet Take 1 tablet (81 mg total) by mouth daily. Swallow whole.   atorvastatin 20 MG tablet Commonly known as: LIPITOR Take 20 mg by mouth every evening.   B COMPLEX PO Take 1 tablet by mouth at bedtime.   brimonidine 0.2 % ophthalmic solution Commonly known as: ALPHAGAN Place 1 drop into both eyes 3 (three) times daily.   cholecalciferol 10 MCG (400 UNIT) Tabs tablet Commonly known as: VITAMIN D3 Take 400 Units by mouth daily.   dorzolamide-timolol 2-0.5 % ophthalmic solution Commonly known as: COSOPT Place 1 drop into both eyes 2 (two) times daily.   Finerenone 10 MG Tabs Take 1 tablet by mouth daily.   latanoprost 0.005 % ophthalmic solution Commonly known as: XALATAN Place 1 drop into both eyes at bedtime.   memantine 10 MG tablet Commonly known as: NAMENDA Take 10 mg by mouth in the morning and at bedtime.   mirtazapine 15 MG disintegrating tablet Commonly known as: REMERON SOL-TAB Take 1 tablet (15 mg total) by mouth at bedtime.   pantoprazole 40 MG tablet Commonly known as: Protonix Take 1 tablet (40 mg total) by mouth daily.   polyethylene glycol 17 g packet Commonly known as: MIRALAX / GLYCOLAX Take 17 g by mouth daily. What changed:  when to take this reasons to take this   Rhopressa 0.02 % Soln Generic drug: Netarsudil Dimesylate Place 1 drop into both eyes in the morning and at bedtime.   senna-docusate 8.6-50 MG tablet Commonly known as: Senokot-S Take 1 tablet by mouth 2 (two) times daily.   Synthroid 88 MCG tablet Generic drug: levothyroxine Take  88 mcg by mouth daily before breakfast.   tamsulosin 0.4 MG Caps capsule Commonly known as: FLOMAX Take 0.4 mg by mouth daily.               Discharge Care Instructions  (From admission, onward)           Start     Ordered   09/05/22 0000  Discharge wound care:       Comments: Wound care  Daily      Comments: Wound care to distal tip or right great toe: Cleanse with NS, pat dry. Paint with povidone-iodine swabstick, allow to dry, top with dry gauze 2x2 and  secure with conform bandaging/paper tape.  08/30/22 0509   09/05/22 1342           Allergies  Allergen Reactions   Insulin Glargine-Lixisenatide Other (See Comments) and Nausea And Vomiting    Stomach cramps   Metformin And Related Nausea And Vomiting and Other (See Comments)    Gi intolerance   Nsaids Other (See Comments)    Renal insufficiency    Follow-up Information     [name], MD. Schedule an appointment as soon as possible for a visit in 2 week(s).   Specialty: Internal Medicine Contact information: [address] [place] [address] [phone]                  The results of significant diagnostics from this hospitalization (including imaging, microbiology, ancillary and laboratory) are listed below for reference.    Significant Diagnostic Studies: CT Renal Stone Study  Result Date: 08/29/2022 CLINICAL DATA:  Abdominal/flank pain. Stone suspected. Acute kidney injury. EXAM: CT ABDOMEN AND PELVIS WITHOUT CONTRAST TECHNIQUE: Multidetector CT imaging of the abdomen and pelvis was performed following the standard protocol without IV contrast. RADIATION DOSE REDUCTION: This exam was performed according to the departmental dose-optimization program which includes automated exposure control, adjustment of the mA and/or kV according to patient size and/or use of iterative reconstruction technique. COMPARISON:  Ultrasound 02/28/2022.  CT 09/16/2021. FINDINGS: Lower  chest: Chronic bilateral effusions larger on the right than the left with mild dependent atelectasis. Elevation of the left hemidiaphragm. Hepatobiliary: Liver parenchyma is normal without contrast. Previous cholecystectomy. Pancreas: Normal Spleen: Normal Adrenals/Urinary Tract: Adrenal glands are normal. The kidneys are small and contain multiple simple appearing cysts. No evidence of mass or hydronephrosis. No follow-up recommended for the cysts. The bladder is normal. Stomach/Bowel: Small hiatal hernia. Small bowel is normal. Normal appendix. Large amount of fecal matter throughout the colon which could be symptomatic. No evidence of diverticulitis or other acute colon finding otherwise. Vascular/Lymphatic: Aortic atherosclerosis. No aneurysm. No adenopathy. Reproductive: Markedly enlarged prostate gland. Other: No free fluid or air. Musculoskeletal: Pronounced fatty atrophy of the right gluteal and leg muscles. No apparent spinal etiology as seen. IMPRESSION: 1. No acute finding to explain the clinical presentation. No evidence of urinary tract stone disease or hydronephrosis. Small kidneys with simple appearing cysts that do not require specific follow-up. 2. Chronic bilateral pleural effusions larger on the right than the left with mild dependent atelectasis. Elevation of the left hemidiaphragm. 3. Large amount of fecal matter throughout the colon which could be symptomatic. No evidence of diverticulitis or other acute bowel finding. 4. Aortic atherosclerosis. 5. Markedly enlarged prostate gland. 6. Pronounced fatty atrophy of the right gluteal and leg muscles. No apparent spinal etiology as seen. Aortic Atherosclerosis (ICD10-I70.0). Electronically Signed   By: Nelson Chimes M.D.   On: 08/29/2022 18:00   DG Chest Port 1 View  Result Date: 08/29/2022 CLINICAL DATA:  Weakness EXAM: PORTABLE CHEST 1 VIEW COMPARISON:  CXR 08/21/22 FINDINGS: Unchanged small bilateral pleural effusions. No pneumothorax. Unchanged  bibasilar atelectasis. Normal cardiac and mediastinal contours. Low lung volumes. Visualized upper abdomen is notable for gastric gaseous distention, new from prior exam. No radiographically apparent displaced rib fractures. IMPRESSION: 1. Unchanged small bilateral pleural effusions and bibasilar atelectasis. 2. Gastric gaseous distention, new from prior exam. Electronically Signed   By: Marin Roberts M.D.   On: 08/29/2022 14:21   CT HEAD WO CONTRAST  Result Date: 08/21/2022 CLINICAL DATA:  Altered mental status.  Lethargy and brown emesis EXAM: CT HEAD WITHOUT CONTRAST TECHNIQUE: Contiguous axial images were obtained from the base of the skull through the vertex without intravenous contrast. RADIATION DOSE REDUCTION: This exam was performed according to the departmental dose-optimization program which includes automated exposure control, adjustment of the mA and/or kV according to patient size and/or use of iterative reconstruction technique. COMPARISON:  08/08/2022 FINDINGS: Brain: No evidence of acute infarction, hemorrhage, hydrocephalus, extra-axial collection or mass lesion/mass effect. Chronic bilateral cerebellar and left deep internal capsule infarcts. Chronic small vessel ischemia with patchy low-density in the cerebral white matter. Generalized atrophy with ventriculomegaly. There is callosum angle narrowing and upward bulging of the corpus callosum, a degree of communicating hydrocephalus is possible. Vascular: No hyperdense vessel or unexpected calcification. Skull: Normal. Negative for fracture or focal lesion. Sinuses/Orbits: No acute finding. IMPRESSION: 1. No acute finding or change from 08/08/2022. 2. Chronic small vessel disease with chronic small vessel infarcts. 3. Ventriculomegaly with some features seen in normal pressure hydrocephalus. Electronically Signed   By: Jorje Guild M.D.   On: 08/21/2022 11:49   DG Chest Port 1 View  Result Date: 08/21/2022 CLINICAL DATA:  Cough. EXAM:  PORTABLE CHEST 1 VIEW COMPARISON:  August 08, 2022. FINDINGS: The heart size and mediastinal contours are within normal limits. Minimal right basilar subsegmental atelectasis is noted. Minimal pleural effusions are noted. The visualized skeletal structures are unremarkable. IMPRESSION: Minimal right basilar subsegmental atelectasis. Minimal pleural effusions. Electronically Signed   By: Marijo Conception M.D.   On: 08/21/2022 09:38   CT HEAD CODE STROKE WO CONTRAST  Result Date: 08/08/2022 CLINICAL DATA:  Code stroke. EXAM: CT HEAD WITHOUT CONTRAST TECHNIQUE: Contiguous axial images were obtained from the base of the skull through the vertex without intravenous contrast. RADIATION DOSE REDUCTION: This exam was performed according to the departmental dose-optimization program which includes automated exposure control, adjustment of the mA and/or kV according to patient size and/or use of iterative reconstruction technique. COMPARISON:  CT head 07/28/2022 FINDINGS: Brain: There is no evidence of acute intracranial hemorrhage, extra-axial fluid collection, or acute territorial infarct. There is unchanged prominence of the ventricular system out of proportion to the degree of sulcal widening. Multiple remote infarcts in the bilateral cerebellar hemispheres and background chronic small-vessel ischemic change are stable. The pituitary and suprasellar region are normal. There is no mass lesion. There is no mass effect or midline shift. Vascular: No hyperdense vessel or unexpected calcification. Skull: Normal. Negative for fracture or focal lesion. Sinuses/Orbits: The imaged paranasal sinuses are clear. Bilateral lens implants are in place. The globes and orbits are otherwise unremarkable. Other: A right mastoid effusion is unchanged. ASPECTS ([place] Stroke Program Early CT Score) - Ganglionic level infarction (caudate, lentiform nuclei, internal capsule, insula, M1-M3 cortex): 7 - Supraganglionic infarction (M4-M6  cortex): 3 Total score (0-10 with 10 being normal): 10 IMPRESSION: Stable noncontrast head CT with no acute intracranial pathology. Findings communicated to Dr. Curly Shores via Amion at 10:43 a.m. Electronically Signed   By: Valetta Mole M.D.   On: 08/08/2022 10:47   DG Chest Portable 1 View  Result Date: 08/08/2022 CLINICAL DATA:  Eval infection EXAM: PORTABLE CHEST 1 VIEW COMPARISON:  Radiograph 07/08/2022 FINDINGS: Unchanged cardiomediastinal silhouette. There are small pleural effusions with bibasilar opacities. No pneumothorax. No acute osseous abnormality. IMPRESSION: Small pleural effusions with adjacent bibasilar opacities, could be atelectasis or infection. Electronically Signed   By: Maurine Simmering M.D.   On: 08/08/2022 10:26    Microbiology: Recent Results (  from the past 240 hour(s))  Culture, blood (routine x 2)     Status: None   Collection Time: 08/29/22  2:12 PM   Specimen: BLOOD RIGHT ARM  Result Value Ref Range Status   Specimen Description   Final    BLOOD RIGHT ARM Performed at [facility], [address], [place], [place] [address]    Special Requests   Final    BOTTLES DRAWN AEROBIC AND ANAEROBIC Blood Culture adequate volume Performed at [facility] [address], [place], [place] [address]    Culture   Final    NO GROWTH 5 DAYS Performed at [facility], [place] [address] [place], [place] [address]    Report Status 09/03/2022 FINAL  Final  Resp panel by RT-PCR (RSV, Flu A&B, Covid) Anterior Nasal Swab     Status: None   Collection Time: 08/29/22  2:12 PM   Specimen: Anterior Nasal Swab  Result Value Ref Range Status   SARS Coronavirus 2 by RT PCR NEGATIVE NEGATIVE Final    Comment: (NOTE) SARS-CoV-2 target nucleic acids are NOT DETECTED.  The SARS-CoV-2 RNA is generally detectable in upper respiratory specimens during the acute phase of infection. The lowest concentration of SARS-CoV-2 viral copies this assay can detect is 138  copies/mL. A negative result does not preclude SARS-Cov-2 infection and should not be used as the sole basis for treatment or other patient management decisions. A negative result may occur with  improper specimen collection/handling, submission of specimen other than nasopharyngeal swab, presence of viral mutation(s) within the areas targeted by this assay, and inadequate number of viral copies(<138 copies/mL). A negative result must be combined with clinical observations, patient history, and epidemiological information. The expected result is Negative.  Fact Sheet for Patients:  EntrepreneurPulse.com.au  Fact Sheet for Healthcare Providers:  IncredibleEmployment.be  This test is no t yet approved or cleared by the [place] FDA and  has been authorized for detection and/or diagnosis of SARS-CoV-2 by FDA under an Emergency Use Authorization (EUA). This EUA will remain  in effect (meaning this test can be used) for the duration of the COVID-19 declaration under Section 564(b)(1) of the Act, 21 U.S.C.section 360bbb-3(b)(1), unless the authorization is terminated  or revoked sooner.       Influenza A by PCR NEGATIVE NEGATIVE Final   Influenza B by PCR NEGATIVE NEGATIVE Final    Comment: (NOTE) The Xpert Xpress SARS-CoV-2/FLU/RSV plus assay is intended as an aid in the diagnosis of influenza from Nasopharyngeal swab specimens and should not be used as a sole basis for treatment. Nasal washings and aspirates are unacceptable for Xpert Xpress SARS-CoV-2/FLU/RSV testing.  Fact Sheet for Patients: EntrepreneurPulse.com.au  Fact Sheet for Healthcare Providers: IncredibleEmployment.be  This test is not yet approved or cleared by the [place] FDA and has been authorized for detection and/or diagnosis of SARS-CoV-2 by FDA under an Emergency Use Authorization (EUA). This EUA will remain in effect (meaning  this test can be used) for the duration of the COVID-19 declaration under Section 564(b)(1) of the Act, 21 U.S.C. section 360bbb-3(b)(1), unless the authorization is terminated or revoked.     Resp Syncytial Virus by PCR NEGATIVE NEGATIVE Final    Comment: (NOTE) Fact Sheet for Patients: EntrepreneurPulse.com.au  Fact Sheet for Healthcare Providers: IncredibleEmployment.be  This test is not yet approved or cleared by the [place] FDA and has been authorized for detection and/or diagnosis of SARS-CoV-2 by FDA under an Emergency Use  Authorization (EUA). This EUA will remain in effect (meaning this test can be used) for the duration of the COVID-19 declaration under Section 564(b)(1) of the Act, 21 U.S.C. section 360bbb-3(b)(1), unless the authorization is terminated or revoked.  Performed at [facility], [place] [address] [place], [place] [address]   Urine Culture     Status: None   Collection Time: 08/29/22  2:29 PM   Specimen: Urine, Random  Result Value Ref Range Status   Specimen Description   Final    URINE, RANDOM Performed at [facility] [address], [place], [place] [address]    Special Requests   Final    NONE Reflexed from [id number] Performed at [facility], [place] [address], [place], [place] [address]    Culture   Final    NO GROWTH Performed at [facility], [place] [address] [place], [place] [address]    Report Status 08/31/2022 FINAL  Final  Urine Culture     Status: Abnormal   Collection Time: 08/30/22 10:19 PM   Specimen: Urine, Clean Catch  Result Value Ref Range Status   Specimen Description   Final    URINE, CLEAN CATCH Performed at [facility], [place] [address] [place], [place] [address]    Special Requests   Final    NONE Reflexed from [id number] Performed at [facility], [place] [address], [place], [place] [address]     Culture (A)  Final    20,000 COLONIES/mL KLEBSIELLA PNEUMONIAE Confirmed Extended Spectrum Beta-Lactamase Producer (ESBL).  In bloodstream infections from ESBL organisms, carbapenems are preferred over piperacillin/tazobactam. They are shown to have a lower risk of mortality.    Report Status 09/01/2022 FINAL  Final   Organism ID, Bacteria KLEBSIELLA PNEUMONIAE (A)  Final      Susceptibility   Klebsiella pneumoniae - MIC*    AMPICILLIN >=32 RESISTANT Resistant     CEFAZOLIN >=64 RESISTANT Resistant     CEFEPIME >=32 RESISTANT Resistant     CEFTRIAXONE >=64 RESISTANT Resistant     CIPROFLOXACIN 0.5 INTERMEDIATE Intermediate     GENTAMICIN <=1 SENSITIVE Sensitive     IMIPENEM 0.5 SENSITIVE Sensitive     NITROFURANTOIN 64 INTERMEDIATE Intermediate     TRIMETH/SULFA >=320 RESISTANT Resistant     AMPICILLIN/SULBACTAM >=32 RESISTANT Resistant     PIP/TAZO <=4 SENSITIVE Sensitive     * 20,000 COLONIES/mL KLEBSIELLA PNEUMONIAE     Labs: Basic Metabolic Panel: Recent Labs  Lab 08/31/22 0455 09/01/22 0552 09/02/22 0532 09/03/22 0852 09/04/22 0518 09/05/22 0532  NA 137 138 133* 135 138 139  K 4.7 4.3 4.2 4.6 4.7 4.8  CL 107 109 107 111 111 110  CO2 21* 19* 22 22 23 23   GLUCOSE 127* 137* 116* 150* 107* 120*  BUN 15 14 17 15 15 19   CREATININE 1.21 1.19 1.31* 1.00 1.24 1.28*  CALCIUM 9.0 9.1 8.8* 8.6* 8.8* 8.7*  MG 1.9  --   --  2.0  --   --    Liver Function Tests: Recent Labs  Lab 08/29/22 1412 09/02/22 0532  AST 13* 10*  ALT 13 11  ALKPHOS 105 92  BILITOT 0.5 0.6  PROT 7.3 6.4*  ALBUMIN 3.6 3.1*   Recent Labs  Lab 08/29/22 1412  LIPASE 32   No results for input(s): "AMMONIA" in the last 168 hours. CBC: Recent Labs  Lab 08/29/22 1412 08/30/22 0346 08/31/22 0455 09/01/22 0552 09/02/22 0532 09/03/22  [id number] 09/04/22 0518  WBC 6.9   < > 6.1 5.6 5.8 6.2 6.9  NEUTROABS 4.1  --  3.4  --   --  3.8  --   HGB 11.8*   < > 10.2* 10.6* 9.4* 10.4* 9.0*  HCT 36.6*   < >  31.7* 36.7* 29.5* 32.1* 28.8*  MCV 93.6   < > 94.6 104.6* 93.9 94.1 98.3  PLT 271   < > 228 219 217 255 212   < > = values in this interval not displayed.   Cardiac Enzymes: Recent Labs  Lab 08/29/22 1412  CKTOTAL 46*   BNP: BNP (last 3 results) No results for input(s): "BNP" in the last 8760 hours.  ProBNP (last 3 results) No results for input(s): "PROBNP" in the last 8760 hours.  CBG: Recent Labs  Lab 09/04/22 1157 09/04/22 1707 09/04/22 2046 09/05/22 0741 09/05/22 1139  GLUCAP 116* 123* 141* 105* 145*       Signed:  Irine Seal MD.  [facility] 09/05/2022, 1:54 PM

## 2022-10-09 ENCOUNTER — Emergency Department (HOSPITAL_COMMUNITY): Payer: Medicare Other

## 2022-10-09 ENCOUNTER — Inpatient Hospital Stay (HOSPITAL_COMMUNITY)
Admission: EM | Admit: 2022-10-09 | Discharge: 2022-10-13 | DRG: 871 | Disposition: A | Discharge status: Hospice | Payer: Medicare Other | Attending: Internal Medicine | Admitting: Internal Medicine

## 2022-10-09 ENCOUNTER — Other Ambulatory Visit: Payer: Self-pay

## 2022-10-09 DIAGNOSIS — E860 Dehydration: Secondary | ICD-10-CM | POA: Diagnosis present

## 2022-10-09 DIAGNOSIS — N3000 Acute cystitis without hematuria: Secondary | ICD-10-CM | POA: Diagnosis present

## 2022-10-09 DIAGNOSIS — R0902 Hypoxemia: Secondary | ICD-10-CM | POA: Diagnosis not present

## 2022-10-09 DIAGNOSIS — I69354 Hemiplegia and hemiparesis following cerebral infarction affecting left non-dominant side: Secondary | ICD-10-CM

## 2022-10-09 DIAGNOSIS — Z79890 Hormone replacement therapy (postmenopausal): Secondary | ICD-10-CM | POA: Diagnosis not present

## 2022-10-09 DIAGNOSIS — E039 Hypothyroidism, unspecified: Secondary | ICD-10-CM | POA: Diagnosis present

## 2022-10-09 DIAGNOSIS — G9341 Metabolic encephalopathy: Secondary | ICD-10-CM | POA: Diagnosis present

## 2022-10-09 DIAGNOSIS — N400 Enlarged prostate without lower urinary tract symptoms: Secondary | ICD-10-CM | POA: Diagnosis not present

## 2022-10-09 DIAGNOSIS — E872 Acidosis, unspecified: Secondary | ICD-10-CM | POA: Diagnosis not present

## 2022-10-09 DIAGNOSIS — F0390 Unspecified dementia without behavioral disturbance: Secondary | ICD-10-CM | POA: Diagnosis present

## 2022-10-09 DIAGNOSIS — R1031 Right lower quadrant pain: Secondary | ICD-10-CM | POA: Diagnosis not present

## 2022-10-09 DIAGNOSIS — N281 Cyst of kidney, acquired: Secondary | ICD-10-CM | POA: Diagnosis not present

## 2022-10-09 DIAGNOSIS — E875 Hyperkalemia: Secondary | ICD-10-CM | POA: Diagnosis present

## 2022-10-09 DIAGNOSIS — J9811 Atelectasis: Secondary | ICD-10-CM | POA: Diagnosis not present

## 2022-10-09 DIAGNOSIS — Z794 Long term (current) use of insulin: Secondary | ICD-10-CM

## 2022-10-09 DIAGNOSIS — A419 Sepsis, unspecified organism: Principal | ICD-10-CM | POA: Diagnosis present

## 2022-10-09 DIAGNOSIS — R4182 Altered mental status, unspecified: Secondary | ICD-10-CM

## 2022-10-09 DIAGNOSIS — Z66 Do not resuscitate: Secondary | ICD-10-CM | POA: Diagnosis present

## 2022-10-09 DIAGNOSIS — R6520 Severe sepsis without septic shock: Secondary | ICD-10-CM | POA: Diagnosis present

## 2022-10-09 DIAGNOSIS — Z833 Family history of diabetes mellitus: Secondary | ICD-10-CM | POA: Diagnosis not present

## 2022-10-09 DIAGNOSIS — Z886 Allergy status to analgesic agent status: Secondary | ICD-10-CM

## 2022-10-09 DIAGNOSIS — Z87442 Personal history of urinary calculi: Secondary | ICD-10-CM

## 2022-10-09 DIAGNOSIS — N1831 Chronic kidney disease, stage 3a: Secondary | ICD-10-CM | POA: Diagnosis present

## 2022-10-09 DIAGNOSIS — R531 Weakness: Secondary | ICD-10-CM | POA: Diagnosis not present

## 2022-10-09 DIAGNOSIS — N3289 Other specified disorders of bladder: Secondary | ICD-10-CM | POA: Diagnosis not present

## 2022-10-09 DIAGNOSIS — M8448XS Pathological fracture, other site, sequela: Secondary | ICD-10-CM | POA: Diagnosis present

## 2022-10-09 DIAGNOSIS — N179 Acute kidney failure, unspecified: Secondary | ICD-10-CM | POA: Diagnosis present

## 2022-10-09 DIAGNOSIS — K5900 Constipation, unspecified: Secondary | ICD-10-CM | POA: Diagnosis present

## 2022-10-09 DIAGNOSIS — I129 Hypertensive chronic kidney disease with stage 1 through stage 4 chronic kidney disease, or unspecified chronic kidney disease: Secondary | ICD-10-CM | POA: Diagnosis present

## 2022-10-09 DIAGNOSIS — E119 Type 2 diabetes mellitus without complications: Secondary | ICD-10-CM

## 2022-10-09 DIAGNOSIS — R739 Hyperglycemia, unspecified: Secondary | ICD-10-CM | POA: Diagnosis not present

## 2022-10-09 DIAGNOSIS — I6381 Other cerebral infarction due to occlusion or stenosis of small artery: Secondary | ICD-10-CM | POA: Diagnosis not present

## 2022-10-09 DIAGNOSIS — E1140 Type 2 diabetes mellitus with diabetic neuropathy, unspecified: Secondary | ICD-10-CM | POA: Diagnosis not present

## 2022-10-09 DIAGNOSIS — I10 Essential (primary) hypertension: Secondary | ICD-10-CM | POA: Diagnosis present

## 2022-10-09 DIAGNOSIS — Z8711 Personal history of peptic ulcer disease: Secondary | ICD-10-CM

## 2022-10-09 DIAGNOSIS — Z8249 Family history of ischemic heart disease and other diseases of the circulatory system: Secondary | ICD-10-CM | POA: Diagnosis not present

## 2022-10-09 DIAGNOSIS — Z7401 Bed confinement status: Secondary | ICD-10-CM | POA: Diagnosis not present

## 2022-10-09 DIAGNOSIS — E0580 Other thyrotoxicosis without thyrotoxic crisis or storm: Secondary | ICD-10-CM | POA: Diagnosis not present

## 2022-10-09 DIAGNOSIS — R000 Tachycardia, unspecified: Secondary | ICD-10-CM | POA: Diagnosis not present

## 2022-10-09 DIAGNOSIS — Z79899 Other long term (current) drug therapy: Secondary | ICD-10-CM

## 2022-10-09 DIAGNOSIS — Z7982 Long term (current) use of aspirin: Secondary | ICD-10-CM

## 2022-10-09 DIAGNOSIS — G319 Degenerative disease of nervous system, unspecified: Secondary | ICD-10-CM | POA: Diagnosis not present

## 2022-10-09 DIAGNOSIS — Z888 Allergy status to other drugs, medicaments and biological substances status: Secondary | ICD-10-CM

## 2022-10-09 DIAGNOSIS — Z8612 Personal history of poliomyelitis: Secondary | ICD-10-CM | POA: Diagnosis not present

## 2022-10-09 LAB — I-STAT VENOUS BLOOD GAS, ED
Acid-base deficit: 2 mmol/L (ref 0.0–2.0)
Bicarbonate: 24.4 mmol/L (ref 20.0–28.0)
Calcium, Ion: 1.23 mmol/L (ref 1.15–1.40)
HCT: 42 % (ref 39.0–52.0)
Hemoglobin: 14.3 g/dL (ref 13.0–17.0)
O2 Saturation: 57 %
Potassium: 5.8 mmol/L — ABNORMAL HIGH (ref 3.5–5.1)
Sodium: 138 mmol/L (ref 135–145)
TCO2: 26 mmol/L (ref 22–32)
pCO2, Ven: 48.3 mmHg (ref 44–60)
pH, Ven: 7.312 (ref 7.25–7.43)
pO2, Ven: 33 mmHg (ref 32–45)

## 2022-10-09 LAB — CBC WITH DIFFERENTIAL/PLATELET
Abs Immature Granulocytes: 0.06 10*3/uL (ref 0.00–0.07)
Basophils Absolute: 0 10*3/uL (ref 0.0–0.1)
Basophils Relative: 0 %
Eosinophils Absolute: 0 10*3/uL (ref 0.0–0.5)
Eosinophils Relative: 0 %
HCT: 39.4 % (ref 39.0–52.0)
Hemoglobin: 13.2 g/dL (ref 13.0–17.0)
Immature Granulocytes: 1 %
Lymphocytes Relative: 11 %
Lymphs Abs: 1.3 10*3/uL (ref 0.7–4.0)
MCH: 30.6 pg (ref 26.0–34.0)
MCHC: 33.5 g/dL (ref 30.0–36.0)
MCV: 91.4 fL (ref 80.0–100.0)
Monocytes Absolute: 0.4 10*3/uL (ref 0.1–1.0)
Monocytes Relative: 3 %
Neutro Abs: 10.2 10*3/uL — ABNORMAL HIGH (ref 1.7–7.7)
Neutrophils Relative %: 85 %
Platelets: 258 10*3/uL (ref 150–400)
RBC: 4.31 MIL/uL (ref 4.22–5.81)
RDW: 13.4 % (ref 11.5–15.5)
WBC: 12 10*3/uL — ABNORMAL HIGH (ref 4.0–10.5)
nRBC: 0 % (ref 0.0–0.2)

## 2022-10-09 LAB — COMPREHENSIVE METABOLIC PANEL
ALT: 17 U/L (ref 0–44)
AST: 27 U/L (ref 15–41)
Albumin: 3.6 g/dL (ref 3.5–5.0)
Alkaline Phosphatase: 142 U/L — ABNORMAL HIGH (ref 38–126)
Anion gap: 12 (ref 5–15)
BUN: 20 mg/dL (ref 8–23)
CO2: 23 mmol/L (ref 22–32)
Calcium: 9.8 mg/dL (ref 8.9–10.3)
Chloride: 101 mmol/L (ref 98–111)
Creatinine, Ser: 1.32 mg/dL — ABNORMAL HIGH (ref 0.61–1.24)
GFR, Estimated: 53 mL/min — ABNORMAL LOW (ref >60)
Glucose, Bld: 251 mg/dL — ABNORMAL HIGH (ref 70–99)
Potassium: 5.4 mmol/L — ABNORMAL HIGH (ref 3.5–5.1)
Sodium: 136 mmol/L (ref 135–145)
Total Bilirubin: 1.5 mg/dL — ABNORMAL HIGH (ref 0.3–1.2)
Total Protein: 8.1 g/dL (ref 6.5–8.1)

## 2022-10-09 LAB — AMMONIA: Ammonia: 36 umol/L — ABNORMAL HIGH (ref 9–35)

## 2022-10-09 LAB — ETHANOL: Alcohol, Ethyl (B): <10 mg/dL (ref <10)

## 2022-10-09 LAB — LACTIC ACID, PLASMA: Lactic Acid, Venous: 2 mmol/L (ref 0.5–1.9)

## 2022-10-09 MED ORDER — IOHEXOL 350 MG/ML SOLN
75.0000 mL | Freq: Once | INTRAVENOUS | Status: AC | PRN
  Administered 2022-10-09: 75 mL via INTRAVENOUS

## 2022-10-09 MED ORDER — LACTATED RINGERS IV BOLUS
1000.0000 mL | Freq: Once | INTRAVENOUS | Status: AC
  Administered 2022-10-09: 1000 mL via INTRAVENOUS

## 2022-10-09 MED ORDER — SODIUM ZIRCONIUM CYCLOSILICATE 10 G PO PACK
10.0000 g | PACK | Freq: Once | ORAL | Status: AC
  Administered 2022-10-09: 10 g via ORAL
  Filled 2022-10-09: qty 1

## 2022-10-09 NOTE — ED Provider Notes (Signed)
[place] EMERGENCY DEPARTMENT AT [facility] Provider Note   CSN: [id number] Arrival date & time: 10/09/22  1906     History  Chief Complaint  Patient presents with   Altered Mental Status    Mike Mack is a 87 y.o. male.   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  The history is provided by the patient's wife who states that he had a decline in mental status that started around 530 today.  She states that he is usually alert and oriented.  No seizure-like activity with no generalized shaking, urinary incontinence, tongue biting however the patient had an episode where he stopped talking and appeared to be staring.  He has since improved somewhat but is still somewhat altered.  No clear fevers or chills.  The patient does endorse some abdominal discomfort but his history of present illness is somewhat limited by his change in mental status.  No focal weakness from the patient's baseline given his history of polio, no new numbness or facial droop, no clear aphasia. Home Medications Prior to Admission medications   Medication Sig Start Date End Date Taking? Authorizing Provider  acetaminophen (TYLENOL) 325 MG tablet Take 2 tablets (650 mg total) by mouth every 6 (six) hours as needed for mild pain (or Fever >/= 101). 11/27/20   Calvert Cantor, MD  amLODipine (NORVASC) 2.5 MG tablet Take 1 tablet (2.5 mg total) by mouth daily. 08/11/22   Azucena Fallen, MD  aspirin EC 81 MG tablet Take 1 tablet (81 mg total) by mouth daily. Swallow whole. 04/06/22   Azucena Fallen, MD  atorvastatin (LIPITOR) 20 MG tablet Take 20 mg by mouth every evening.  11/03/13   [provider]  B Complex Vitamins (B COMPLEX PO) Take 1 tablet by mouth at bedtime.     [provider]  brimonidine (ALPHAGAN) 0.2 % ophthalmic solution Place 1 drop into both eyes 3 (three) times daily.    [provider]  cholecalciferol (VITAMIN D3) 10 MCG (400 UNIT) TABS tablet Take 400 Units by mouth daily.    [provider]  dorzolamide-timolol (COSOPT) 22.3-6.8 MG/ML ophthalmic solution Place 1 drop into both eyes 2 (two) times daily. 03/04/20   [provider]  Finerenone 10 MG TABS Take 1 tablet by mouth daily.    [provider]  latanoprost (XALATAN) 0.005 % ophthalmic solution Place 1 drop into both eyes at bedtime.    [provider]  levothyroxine (SYNTHROID) 88 MCG tablet Take 88 mcg by mouth daily before breakfast.    [provider]  memantine (NAMENDA) 10 MG tablet Take 10 mg by mouth in the morning and at bedtime. 11/04/20   [provider]  mirtazapine (REMERON SOL-TAB) 15 MG disintegrating tablet Take 1 tablet (15 mg total) by mouth at bedtime. 03/09/22   Joseph Art, DO  pantoprazole (PROTONIX) 40 MG tablet Take 1 tablet (40 mg total) by mouth daily. 07/08/22   Sherian Maroon A, PA  polyethylene glycol (MIRALAX / GLYCOLAX) 17 g packet Take 17 g by mouth daily. 09/05/22   Rodolph Bong, MD  RHOPRESSA 0.02 % SOLN Place 1 drop into both eyes in the morning and at bedtime. 03/05/20   [provider]  senna-docusate (SENOKOT-S) 8.6-50 MG tablet Take  1 tablet by mouth 2 (two) times daily. 09/05/22   Rodolph Bong, MD  Tamsulosin HCl (FLOMAX) 0.4 MG CAPS Take 0.4 mg by mouth daily.    [provider]      Allergies    Insulin glargine-lixisenatide, Metformin and related, and Nsaids    Review of Systems   Review of Systems  Unable to perform ROS: Mental status change    Physical Exam Updated Vital Signs BP 126/83 (BP Location: Right Arm)   Pulse (!) 106   Temp (!) 97.5 F (36.4 C) (Oral)   Resp 20   SpO2 99%  Physical Exam Vitals and nursing note reviewed.   Constitutional:      General: He is not in acute distress.    Appearance: He is well-developed.  HENT:     Head: Normocephalic and atraumatic.  Eyes:     Conjunctiva/sclera: Conjunctivae normal.  Cardiovascular:     Rate and Rhythm: Normal rate and regular rhythm.     Heart sounds: No murmur heard. Pulmonary:     Effort: Pulmonary effort is normal. No respiratory distress.     Breath sounds: Normal breath sounds.  Abdominal:     Palpations: Abdomen is soft.     Tenderness: There is no abdominal tenderness.  Musculoskeletal:        General: No swelling.     Cervical back: Neck supple.  Skin:    General: Skin is warm and dry.     Capillary Refill: Capillary refill takes less than 2 seconds.  Neurological:     Mental Status: He is alert.     Comments: MENTAL STATUS EXAM:    Orientation: Alert and oriented to person, place, DISORIENTED TO TIME. Memory: Cooperative, follows commands well.  Language: Speech is clear and language is normal.   CRANIAL NERVES:    CN 2 (Optic): Visual fields intact to confrontation.  CN 3,4,6 (EOM): Pupils equal and reactive to light. Full extraocular eye movement without nystagmus.  CN 5 (Trigeminal): Facial sensation is normal, no weakness of masticatory muscles.  CN 7 (Facial): No facial weakness or asymmetry.  CN 8 (Auditory): Auditory acuity grossly normal.  CN 9,10 (Glossophar): The uvula is midline, the palate elevates symmetrically.  CN 11 (spinal access): Normal sternocleidomastoid and trapezius strength.  CN 12 (Hypoglossal): The tongue is midline. No atrophy or fasciculations.Marland Kitchen   MOTOR:  Muscle Strength: 5/5RUE, 5/5LUE, 3/5RLE, 5/5LLE.   COORDINATION:  No tremor.   SENSATION:   Intact to light touch all four extremities.    Psychiatric:        Mood and Affect: Mood normal.     ED Results / Procedures / Treatments   Labs (all labs ordered are listed, but only abnormal results are displayed) Labs Reviewed  COMPREHENSIVE  METABOLIC PANEL - Abnormal; Notable for the following components:      Result Value   Potassium 5.4 (*)    Glucose, Bld 251 (*)    Creatinine, Ser 1.32 (*)    Alkaline Phosphatase 142 (*)    Total Bilirubin 1.5 (*)    GFR, Estimated 53 (*)    All other components within normal limits  CBC WITH DIFFERENTIAL/PLATELET - Abnormal; Notable for the following components:   WBC 12.0 (*)    Neutro Abs 10.2 (*)    All other components within normal limits  AMMONIA - Abnormal; Notable for the following components:   Ammonia 36 (*)    All other components within normal limits  LACTIC ACID,  PLASMA - Abnormal; Notable for the following components:   Lactic Acid, Venous 2.0 (*)    All other components within normal limits  I-STAT VENOUS BLOOD GAS, ED - Abnormal; Notable for the following components:   Potassium 5.8 (*)    All other components within normal limits  CULTURE, BLOOD (ROUTINE X 2)  CULTURE, BLOOD (ROUTINE X 2)  ETHANOL  RAPID URINE DRUG SCREEN, HOSP PERFORMED  URINALYSIS, W/ REFLEX TO CULTURE (INFECTION SUSPECTED)  POTASSIUM  BILIRUBIN, DIRECT  CBG MONITORING, ED    EKG EKG Interpretation  Date/Time:  Monday October 09 2022 19:36:37 EDT Ventricular Rate:  107 PR Interval:  196 QRS Duration: 78 QT Interval:  337 QTC Calculation: 450 R Axis:   41 Text Interpretation: Sinus tachycardia Probable anteroseptal infarct, old Confirmed by Ernie Avena ([id number]) on 10/09/2022 8:13:07 PM  Radiology CT ABDOMEN PELVIS W CONTRAST  Result Date: 10/09/2022 CLINICAL DATA:  Right lower quadrant abdominal pain. Altered mental status. EXAM: CT ABDOMEN AND PELVIS WITH CONTRAST TECHNIQUE: Multidetector CT imaging of the abdomen and pelvis was performed using the standard protocol following bolus administration of intravenous contrast. RADIATION DOSE REDUCTION: This exam was performed according to the departmental dose-optimization program which includes automated exposure control, adjustment of the  mA and/or kV according to patient size and/or use of iterative reconstruction technique. CONTRAST:  75mL OMNIPAQUE IOHEXOL 350 MG/ML SOLN COMPARISON:  08/29/2022 FINDINGS: Lower chest: Small right pleural effusion with mild right pleural thickening and calcification. Effusion is improved since prior study. Wall thickening in the distal esophagus suggesting reflux disease or esophagitis. No proximal dilatation. Epiphrenic diverticulum of the distal esophagus. Hepatobiliary: No focal liver abnormality is seen. Status post cholecystectomy. No biliary dilatation. Pancreas: Unremarkable. No pancreatic ductal dilatation or surrounding inflammatory changes. Spleen: Normal in size without focal abnormality. Adrenals/Urinary Tract: No adrenal gland nodules. Kidneys are atrophic and scarred. Nephrograms are symmetrical. Bilateral renal cysts, largest on the left measuring 3.9 cm diameter. No change since prior study. No imaging follow-up is indicated. No hydronephrosis or hydroureter. Bladder is decompressed. Suggestion of asymmetrical bladder wall thickening to the left, likely due to under distention. No significant wall thickening was notice on prior study with better distention. Stomach/Bowel: Stomach, small bowel, and colon are not abnormally distended. No wall thickening or inflammatory changes. Colon is diffusely stool-filled suggesting possible constipation. Clinical correlation is suggested. Appendix is normal. Vascular/Lymphatic: No significant vascular findings are present. No enlarged abdominal or pelvic lymph nodes. Reproductive: Prostate gland is markedly enlarged, measuring 6.2 cm diameter. Other: No free air or free fluid in the abdomen. Abdominal wall musculature appears intact. Fatty atrophy of the right hip musculature. Musculoskeletal: Degenerative changes and scoliosis of the lumbar spine. IMPRESSION: 1. Prominent stool-filled colon suggesting constipation. Clinical correlation suggested. No evidence of  bowel obstruction. 2. Esophageal wall thickening likely indicating reflux disease with small epiphrenic diverticulum. 3. Decreasing size of right pleural effusion. Persistent finding of pleural thickening and calcification, likely chronic. 4. Bladder wall thickening is likely due to under distention as this was not present on previous study. 5. Marked enlargement of the prostate gland. Electronically Signed   By: Burman Nieves M.D.   On: 10/09/2022 22:43   CT HEAD WO CONTRAST  Result Date: 10/09/2022 CLINICAL DATA:  Mental status change, unknown cause. EXAM: CT HEAD WITHOUT CONTRAST TECHNIQUE: Contiguous axial images were obtained from the base of the skull through the vertex without intravenous contrast. RADIATION DOSE REDUCTION: This exam was performed according to the departmental dose-optimization program  which includes automated exposure control, adjustment of the mA and/or kV according to patient size and/or use of iterative reconstruction technique. COMPARISON:  08/21/2022. FINDINGS: Brain: No acute intracranial hemorrhage, midline shift or mass effect. No extra-axial fluid collection. Extensive subcortical and periventricular white matter hypodensities are present bilaterally. Diffuse atrophy is noted. Old lacunar infarcts are present in the cerebellar hemispheres. There is mild hydrocephalus out of proportion to degree of atrophy, unchanged from the previous exam. Vascular: No hyperdense vessel or unexpected calcification. Skull: Normal. Negative for fracture or focal lesion. Sinuses/Orbits: No acute finding. Other: None. IMPRESSION: 1. No acute intracranial process. 2. Atrophy with chronic microvascular ischemic changes. 3. Stable ventriculomegaly, which may be associated with normal pressure hydrocephalus. Electronically Signed   By: Thornell Sartorius M.D.   On: 10/09/2022 20:24   DG Chest Port 1 View  Result Date: 10/09/2022 CLINICAL DATA:  Altered mental status EXAM: PORTABLE CHEST 1 VIEW  COMPARISON:  08/29/2022 FINDINGS: Shallow inspiration. Mild blunting of costophrenic angles suggesting small pleural effusions, decreased since prior study. Atelectasis in the lung bases. Heart size and pulmonary vascularity are normal. No pneumothorax. Mediastinal contours appear intact. IMPRESSION: Shallow inspiration. Blunting of costophrenic angles suggesting small effusions, decreased since prior study. Electronically Signed   By: Burman Nieves M.D.   On: 10/09/2022 19:37    Procedures Procedures    Medications Ordered in ED Medications  sodium zirconium cyclosilicate (LOKELMA) packet 10 g (has no administration in time range)  lactated ringers bolus 1,000 mL (1,000 mLs Intravenous New Bag/Given 10/09/22 2116)  iohexol (OMNIPAQUE) 350 MG/ML injection 75 mL (75 mLs Intravenous Contrast Given 10/09/22 2231)    ED Course/ Medical Decision Making/ A&P                             Medical Decision Making Amount and/or Complexity of Data Reviewed Labs: ordered. Radiology: ordered.  Risk Prescription drug management.   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  The history is provided by the patient's wife who states that he had a decline in mental status that started around 530 today.  She states that he is usually alert and oriented.  No seizure-like activity with no generalized shaking, urinary incontinence, tongue biting however the patient had an episode where he stopped talking and appeared to be staring.  He has since improved somewhat but is still somewhat altered.  No clear fevers or chills.  The patient does endorse some abdominal discomfort but his history of present illness is somewhat limited by his change in mental status.  No focal weakness from the patient's baseline given his history of  polio, no new numbness or facial droop, no clear aphasia.  On arrival, the patient was mildly hypothermic, temperature 97.5, mildly tachycardic heart rate 106, BP 126/83, saturating 100% on room air.  Physical exam significant for generalized abdominal tenderness to palpation, right lower extremity at baseline strength given the patient's history of polio, no focal clear deficit, patient disoriented to time, oriented to person and place.  Had an episode of transient confusion earlier today, had been mildly hypotensive with EMS, improved and mental status has been improving since.  Differential diagnosis includes TIA/CVA, hypovolemia, toxic metabolic derangement, infectious etiology, electrolyte abnormality, worsening dementia, small bowel obstruction, other intra-abdominal abnormality, urosepsis.  Initial laboratory evaluation significant for CMP with mild hyperkalemia to 5.4, hyperglycemia to 251, CKD at 1.32, elevated T. bili to 1.5, direct bili pending, CBC with a leukocytosis of 12.0, hemoglobin of 13.2, ammonia nonspecifically elevated to 36, ethanol level normal, mildly elevated lactic acid of 2.0.  Blood cultures x 2 were collected.  Repeat potassium was ordered and the patient was administered Lokelma for mildly elevated potassium.   Chest x-ray was performed which revealed no cardiac or pulmonary abnormality.  A VBG which did not reveal an acidosis or alkalosis.  A CT head was performed which revealed the following: IMPRESSION:  1. No acute intracranial process.  2. Atrophy with chronic microvascular ischemic changes.  3. Stable ventriculomegaly, which may be associated with normal  pressure hydrocephalus.   MRI brain ordered to further evaluate.  Without new focal neurologic deficit, lower concern for CVA.  Was administered an IV fluid bolus.  CT of the abdomen pelvis was formed which revealed the following: IMPRESSION:  1. Prominent stool-filled colon suggesting constipation. Clinical   correlation suggested. No evidence of bowel obstruction.  2. Esophageal wall thickening likely indicating reflux disease with  small epiphrenic diverticulum.  3. Decreasing size of right pleural effusion. Persistent finding of  pleural thickening and calcification, likely chronic.  4. Bladder wall thickening is likely due to under distention as this  was not present on previous study.  5. Marked enlargement of the prostate gland.    Patient had a successful bowel movement while in the ER.  Given the patient's encephalopathy, electrolyte abnormalities, informed family bedside that the patient would likely be admitted for observation.  Urinalysis and MRI pending at time of signout.  Plan to follow-up the results of these tests, reassess the patient for likely admission. Signout given to Dr. Judd Lien at 2330.   Final Clinical Impression(s) / ED Diagnoses Final diagnoses:  Altered mental status, unspecified altered mental status type  Constipation, unspecified constipation type  Hyperkalemia    Rx / DC Orders ED Discharge Orders     None         Ernie Avena, MD 10/09/22 [phone]

## 2022-10-09 NOTE — ED Triage Notes (Signed)
Pt to ED via EMS from home with c/o AMS. Per EMS family stated that pt stopped talking, and was drooling. They stated pt is usually alert and oriented at baseline. Family denies any injuries or seizure like activity. Pt arrives to ED alert to self, denies any pain. Pt has previous left side weakness from CVA.

## 2022-10-10 ENCOUNTER — Emergency Department (HOSPITAL_COMMUNITY): Payer: Medicare Other

## 2022-10-10 ENCOUNTER — Encounter (HOSPITAL_COMMUNITY): Payer: Self-pay | Admitting: Internal Medicine

## 2022-10-10 DIAGNOSIS — Z8249 Family history of ischemic heart disease and other diseases of the circulatory system: Secondary | ICD-10-CM | POA: Diagnosis not present

## 2022-10-10 DIAGNOSIS — E860 Dehydration: Secondary | ICD-10-CM | POA: Diagnosis present

## 2022-10-10 DIAGNOSIS — E875 Hyperkalemia: Secondary | ICD-10-CM | POA: Diagnosis present

## 2022-10-10 DIAGNOSIS — Z7982 Long term (current) use of aspirin: Secondary | ICD-10-CM | POA: Diagnosis not present

## 2022-10-10 DIAGNOSIS — R4182 Altered mental status, unspecified: Secondary | ICD-10-CM

## 2022-10-10 DIAGNOSIS — Z7401 Bed confinement status: Secondary | ICD-10-CM | POA: Diagnosis not present

## 2022-10-10 DIAGNOSIS — A419 Sepsis, unspecified organism: Secondary | ICD-10-CM | POA: Diagnosis present

## 2022-10-10 DIAGNOSIS — K5900 Constipation, unspecified: Secondary | ICD-10-CM

## 2022-10-10 DIAGNOSIS — N400 Enlarged prostate without lower urinary tract symptoms: Secondary | ICD-10-CM

## 2022-10-10 DIAGNOSIS — R6520 Severe sepsis without septic shock: Secondary | ICD-10-CM | POA: Diagnosis present

## 2022-10-10 DIAGNOSIS — G9341 Metabolic encephalopathy: Secondary | ICD-10-CM | POA: Diagnosis present

## 2022-10-10 DIAGNOSIS — N1831 Chronic kidney disease, stage 3a: Secondary | ICD-10-CM | POA: Diagnosis present

## 2022-10-10 DIAGNOSIS — Z79890 Hormone replacement therapy (postmenopausal): Secondary | ICD-10-CM | POA: Diagnosis not present

## 2022-10-10 DIAGNOSIS — Z833 Family history of diabetes mellitus: Secondary | ICD-10-CM | POA: Diagnosis not present

## 2022-10-10 DIAGNOSIS — F0390 Unspecified dementia without behavioral disturbance: Secondary | ICD-10-CM | POA: Diagnosis present

## 2022-10-10 DIAGNOSIS — E039 Hypothyroidism, unspecified: Secondary | ICD-10-CM | POA: Diagnosis present

## 2022-10-10 DIAGNOSIS — I10 Essential (primary) hypertension: Secondary | ICD-10-CM

## 2022-10-10 DIAGNOSIS — E872 Acidosis, unspecified: Secondary | ICD-10-CM | POA: Diagnosis present

## 2022-10-10 DIAGNOSIS — I69354 Hemiplegia and hemiparesis following cerebral infarction affecting left non-dominant side: Secondary | ICD-10-CM | POA: Diagnosis not present

## 2022-10-10 DIAGNOSIS — N3000 Acute cystitis without hematuria: Secondary | ICD-10-CM | POA: Diagnosis present

## 2022-10-10 DIAGNOSIS — E119 Type 2 diabetes mellitus without complications: Secondary | ICD-10-CM

## 2022-10-10 DIAGNOSIS — Z8612 Personal history of poliomyelitis: Secondary | ICD-10-CM | POA: Diagnosis not present

## 2022-10-10 DIAGNOSIS — N179 Acute kidney failure, unspecified: Secondary | ICD-10-CM | POA: Diagnosis present

## 2022-10-10 DIAGNOSIS — I129 Hypertensive chronic kidney disease with stage 1 through stage 4 chronic kidney disease, or unspecified chronic kidney disease: Secondary | ICD-10-CM | POA: Diagnosis present

## 2022-10-10 DIAGNOSIS — E0580 Other thyrotoxicosis without thyrotoxic crisis or storm: Secondary | ICD-10-CM | POA: Diagnosis not present

## 2022-10-10 DIAGNOSIS — E1140 Type 2 diabetes mellitus with diabetic neuropathy, unspecified: Secondary | ICD-10-CM | POA: Diagnosis present

## 2022-10-10 DIAGNOSIS — Z66 Do not resuscitate: Secondary | ICD-10-CM | POA: Diagnosis present

## 2022-10-10 LAB — URINALYSIS, W/ REFLEX TO CULTURE (INFECTION SUSPECTED)
Bacteria, UA: NONE SEEN
Bilirubin Urine: NEGATIVE
Glucose, UA: NEGATIVE mg/dL
Ketones, ur: NEGATIVE mg/dL
Nitrite: NEGATIVE
Protein, ur: 100 mg/dL — AB
RBC / HPF: >50 RBC/hpf (ref 0–5)
Specific Gravity, Urine: 1.042 — ABNORMAL HIGH (ref 1.005–1.030)
WBC, UA: >50 WBC/hpf (ref 0–5)
pH: 5 (ref 5.0–8.0)

## 2022-10-10 LAB — CBC WITH DIFFERENTIAL/PLATELET
Abs Immature Granulocytes: 0.03 10*3/uL (ref 0.00–0.07)
Basophils Absolute: 0 10*3/uL (ref 0.0–0.1)
Basophils Relative: 0 %
Eosinophils Absolute: 0 10*3/uL (ref 0.0–0.5)
Eosinophils Relative: 0 %
HCT: 32.9 % — ABNORMAL LOW (ref 39.0–52.0)
Hemoglobin: 11.2 g/dL — ABNORMAL LOW (ref 13.0–17.0)
Immature Granulocytes: 0 %
Lymphocytes Relative: 20 %
Lymphs Abs: 2 10*3/uL (ref 0.7–4.0)
MCH: 30.4 pg (ref 26.0–34.0)
MCHC: 34 g/dL (ref 30.0–36.0)
MCV: 89.2 fL (ref 80.0–100.0)
Monocytes Absolute: 0.5 10*3/uL (ref 0.1–1.0)
Monocytes Relative: 5 %
Neutro Abs: 7.2 10*3/uL (ref 1.7–7.7)
Neutrophils Relative %: 75 %
Platelets: 248 10*3/uL (ref 150–400)
RBC: 3.69 MIL/uL — ABNORMAL LOW (ref 4.22–5.81)
RDW: 13.4 % (ref 11.5–15.5)
WBC: 9.7 10*3/uL (ref 4.0–10.5)
nRBC: 0 % (ref 0.0–0.2)

## 2022-10-10 LAB — COMPREHENSIVE METABOLIC PANEL
ALT: 13 U/L (ref 0–44)
AST: 15 U/L (ref 15–41)
Albumin: 3 g/dL — ABNORMAL LOW (ref 3.5–5.0)
Alkaline Phosphatase: 121 U/L (ref 38–126)
Anion gap: 8 (ref 5–15)
BUN: 18 mg/dL (ref 8–23)
CO2: 24 mmol/L (ref 22–32)
Calcium: 9 mg/dL (ref 8.9–10.3)
Chloride: 104 mmol/L (ref 98–111)
Creatinine, Ser: 1.23 mg/dL (ref 0.61–1.24)
GFR, Estimated: 58 mL/min — ABNORMAL LOW (ref >60)
Glucose, Bld: 199 mg/dL — ABNORMAL HIGH (ref 70–99)
Potassium: 3.9 mmol/L (ref 3.5–5.1)
Sodium: 136 mmol/L (ref 135–145)
Total Bilirubin: 0.7 mg/dL (ref 0.3–1.2)
Total Protein: 6.8 g/dL (ref 6.5–8.1)

## 2022-10-10 LAB — BLOOD GAS, VENOUS
Acid-base deficit: 1.1 mmol/L (ref 0.0–2.0)
Bicarbonate: 25.3 mmol/L (ref 20.0–28.0)
O2 Saturation: 39.2 %
Patient temperature: 37
pCO2, Ven: 48 mmHg (ref 44–60)
pH, Ven: 7.33 (ref 7.25–7.43)
pO2, Ven: <31 mmHg — CL (ref 32–45)

## 2022-10-10 LAB — PROTIME-INR
INR: 1.3 — ABNORMAL HIGH (ref 0.8–1.2)
Prothrombin Time: 16 seconds — ABNORMAL HIGH (ref 11.4–15.2)

## 2022-10-10 LAB — RAPID URINE DRUG SCREEN, HOSP PERFORMED
Amphetamines: NOT DETECTED
Barbiturates: NOT DETECTED
Benzodiazepines: NOT DETECTED
Cocaine: NOT DETECTED
Opiates: NOT DETECTED
Tetrahydrocannabinol: NOT DETECTED

## 2022-10-10 LAB — LACTIC ACID, PLASMA: Lactic Acid, Venous: 1.4 mmol/L (ref 0.5–1.9)

## 2022-10-10 LAB — HEMOGLOBIN A1C
Hgb A1c MFr Bld: 6.8 % — ABNORMAL HIGH (ref 4.8–5.6)
Mean Plasma Glucose: 148.46 mg/dL

## 2022-10-10 LAB — T4, FREE: Free T4: 1.72 ng/dL — ABNORMAL HIGH (ref 0.61–1.12)

## 2022-10-10 LAB — MAGNESIUM: Magnesium: 1.7 mg/dL (ref 1.7–2.4)

## 2022-10-10 LAB — GLUCOSE, CAPILLARY
Glucose-Capillary: 167 mg/dL — ABNORMAL HIGH (ref 70–99)
Glucose-Capillary: 179 mg/dL — ABNORMAL HIGH (ref 70–99)

## 2022-10-10 LAB — CBG MONITORING, ED
Glucose-Capillary: 138 mg/dL — ABNORMAL HIGH (ref 70–99)
Glucose-Capillary: 171 mg/dL — ABNORMAL HIGH (ref 70–99)

## 2022-10-10 LAB — BILIRUBIN, DIRECT: Bilirubin, Direct: 0.1 mg/dL (ref 0.0–0.2)

## 2022-10-10 LAB — PROCALCITONIN: Procalcitonin: 0.15 ng/mL

## 2022-10-10 LAB — TSH: TSH: 0.264 u[IU]/mL — ABNORMAL LOW (ref 0.350–4.500)

## 2022-10-10 LAB — POTASSIUM: Potassium: 4.9 mmol/L (ref 3.5–5.1)

## 2022-10-10 MED ORDER — PANTOPRAZOLE SODIUM 40 MG PO TBEC
40.0000 mg | DELAYED_RELEASE_TABLET | Freq: Every day | ORAL | Status: DC
  Administered 2022-10-10 – 2022-10-13 (×4): 40 mg via ORAL
  Filled 2022-10-10 (×4): qty 1

## 2022-10-10 MED ORDER — BRIMONIDINE TARTRATE 0.2 % OP SOLN
1.0000 [drp] | Freq: Three times a day (TID) | OPHTHALMIC | Status: DC
  Administered 2022-10-10 – 2022-10-13 (×9): 1 [drp] via OPHTHALMIC
  Filled 2022-10-10 (×2): qty 5

## 2022-10-10 MED ORDER — SODIUM CHLORIDE 0.9 % IV SOLN
1.0000 g | INTRAVENOUS | Status: DC
  Administered 2022-10-11 – 2022-10-13 (×3): 1 g via INTRAVENOUS
  Filled 2022-10-10 (×3): qty 10

## 2022-10-10 MED ORDER — MELATONIN 3 MG PO TABS: 3.0000 mg | ORAL_TABLET | Freq: Every evening | ORAL | Status: DC | PRN

## 2022-10-10 MED ORDER — ASPIRIN 81 MG PO TBEC
81.0000 mg | DELAYED_RELEASE_TABLET | Freq: Every day | ORAL | Status: DC
  Administered 2022-10-10 – 2022-10-13 (×4): 81 mg via ORAL
  Filled 2022-10-10 (×4): qty 1

## 2022-10-10 MED ORDER — LEVOTHYROXINE SODIUM 88 MCG PO TABS
88.0000 ug | ORAL_TABLET | Freq: Every day | ORAL | Status: DC
  Administered 2022-10-10 – 2022-10-11 (×2): 88 ug via ORAL
  Filled 2022-10-10 (×2): qty 1

## 2022-10-10 MED ORDER — MEMANTINE HCL 10 MG PO TABS
10.0000 mg | ORAL_TABLET | Freq: Two times a day (BID) | ORAL | Status: DC
  Administered 2022-10-10 – 2022-10-13 (×7): 10 mg via ORAL
  Filled 2022-10-10 (×7): qty 1

## 2022-10-10 MED ORDER — INSULIN ASPART 100 UNIT/ML IJ SOLN
0.0000 [IU] | Freq: Three times a day (TID) | INTRAMUSCULAR | Status: DC
  Administered 2022-10-10 (×2): 1 [IU] via SUBCUTANEOUS
  Administered 2022-10-11: 2 [IU] via SUBCUTANEOUS
  Administered 2022-10-11 – 2022-10-12 (×2): 1 [IU] via SUBCUTANEOUS

## 2022-10-10 MED ORDER — ONDANSETRON HCL 4 MG/2ML IJ SOLN: 4.0000 mg | Freq: Four times a day (QID) | INTRAMUSCULAR | Status: DC | PRN

## 2022-10-10 MED ORDER — TAMSULOSIN HCL 0.4 MG PO CAPS
0.4000 mg | ORAL_CAPSULE | Freq: Every day | ORAL | Status: DC
  Administered 2022-10-10 – 2022-10-13 (×4): 0.4 mg via ORAL
  Filled 2022-10-10 (×4): qty 1

## 2022-10-10 MED ORDER — SODIUM CHLORIDE 0.9 % IV SOLN
1.0000 g | Freq: Once | INTRAVENOUS | Status: AC
  Administered 2022-10-10: 1 g via INTRAVENOUS
  Filled 2022-10-10: qty 10

## 2022-10-10 MED ORDER — ATORVASTATIN CALCIUM 10 MG PO TABS
20.0000 mg | ORAL_TABLET | Freq: Every evening | ORAL | Status: DC
  Administered 2022-10-10 – 2022-10-12 (×3): 20 mg via ORAL
  Filled 2022-10-10 (×3): qty 2

## 2022-10-10 MED ORDER — SODIUM CHLORIDE 0.9 % IV SOLN: INTRAVENOUS | Status: AC

## 2022-10-10 MED ORDER — ACETAMINOPHEN 325 MG PO TABS: 650.0000 mg | ORAL_TABLET | Freq: Four times a day (QID) | ORAL | Status: DC | PRN

## 2022-10-10 MED ORDER — ACETAMINOPHEN 650 MG RE SUPP: 650.0000 mg | Freq: Four times a day (QID) | RECTAL | Status: DC | PRN

## 2022-10-10 MED ORDER — SODIUM CHLORIDE 0.9 % IV SOLN: INTRAVENOUS | Status: DC

## 2022-10-10 NOTE — H&P (Signed)
History and Physical      Mike Mack ZOX:[id number] DOB: 05/07/1936 DOA: 10/09/2022  PCP: Georgianne Fick, MD  Patient coming from: home   I have personally briefly reviewed patient's old medical records in [facility]  Chief Complaint: Altered mental status  HPI: Mike Mack is a 87 y.o. male with medical history significant for BPH, essential pretension, type 2 diabetes mellitus, acquired hypothyroidism, who is admitted to [facility] on 10/09/2022 with acute metabolic encephalopathy after presenting from home to [facility] ED complaining of altered mental status.   In the setting of the patient's altered mental status, the following history is provided by the patient's wife in addition to my discussions with the EDP and via chart review.  The patient's wife conveys that the patient has exhibited evidence of 1 day of progressive confusion, altered most days, somnolence relative to baseline mental status and activity level.  No recent preceding trauma.  Per chart review, it appears that he was also hospitalized for acute metabolic encephalopathy in February 2024.  Medical history is notable for acquired hypothyroidism in addition to send hypertension, with outpatient show antihypertensive medications include finerenone.      ED Course:  Vital signs in the ED were notable for the following: Afebrile; heart rates in the low 100s to 1 teens; systolic blood pressures in the 120s to 170s; respiratory rate 14-20, oxygen saturation 99% on room air.  Labs were notable for the following: CMP notable for sodium 136, potassium 5.4, bicarbonate 23, anion gap 12, creatinine 1.32, glucose 251, liver enzymes within normal limits.  Serum ethanol level less than 10.  Ammonia 36.  Lactic acid 2.0.  CBC notable for will with cell count 12,000 with 85% neutrophils, hemoglobin 13.2.  Urinalysis associated with hazy appearing specimen notable for greater than 50 white blood cells, large  leukocyte esterase, description of those cells, specific gravity 1.042.  Urinary drug screen was pan negative.  Blood cultures x 2 and urine culture were collected prior to initiation of IV antibiotics  Per my interpretation, EKG in ED demonstrated the following: Sinus tachycardia with heart rate 107, normal intervals, and no evidence of T wave or ST changes clinically no evidence of ST elevation.  Imaging and additional notable ED work-up: Chest x-ray, 1 view, in comparison to most recent prior chest x-ray from 08/29/2022, performed as read, shows interval decrease in size of small bilateral pleural effusions, demonstrate no evidence of acute cardiopulmonary process, including no evidence of infiltrate, edema, or pneumothorax.  Noncontrast CT head, performed radiology read shows no evidence of acute intracranial process, Cleen evidence of intracranial hemorrhage nor any evidence of acute infarct.  MRI brain showed no evidence of acute intracranial process, there is no evidence of acute infarct.  CT abdomen/pelvis, in comparison to CT abdomen/pelvis from 08/29/2022 showed interval development of bladder wall thickening, consistent with acute cystitis.  While in the ED, the following were administered: Lokelma 10 g p.o. x 1, Rocephin, lactated Ringer's 1000 L bolus.  Following these measures, repeat potassium levels obtained and noted to be 4.9.  Subsequently, the patient was admitted for further evaluation and management of presenting acute metabolic encephalopathy in the setting of severe sepsis due to acute cystitis, presentation also will for clinical evidence of dehydration and resolved hyperkalemia.     Review of Systems: As per HPI otherwise 10 point review of systems negative.   Past Medical History:  Diagnosis Date   Acute metabolic encephalopathy 08/09/2022   Anemia 05/13/2013  Arthritis    "right leg" (07/25/2013)   BPH (benign prostatic hyperplasia)    Cholecystitis, acute 05/24/2013    Constipation 05/13/2013   Exertional shortness of breath    "sometimes" (07/25/2013)   History of kidney stones    History of stomach ulcers    Hypertension    Hypothermia 04/01/2022   Neuropathy    Polio    Polio Childhood   Small bowel obstruction    Stroke 2014   residual:  "left hand kind of numb" (07/25/2013)   Type II diabetes mellitus     Past Surgical History:  Procedure Laterality Date   BIOPSY  11/30/2020   Procedure: BIOPSY;  Surgeon: Kerin Salen, MD;  Location: [facility] ENDOSCOPY;  Service: Gastroenterology;;   BIOPSY  03/03/2022   Procedure: BIOPSY;  Surgeon: Vida Rigger, MD;  Location: [facility] ENDOSCOPY;  Service: Gastroenterology;;   BOWEL RESECTION     CATARACT EXTRACTION W/ INTRAOCULAR LENS  IMPLANT, BILATERAL Bilateral 2000's   CHOLECYSTECTOMY N/A 07/25/2013   Procedure: LAPAROSCOPIC CHOLECYSTECTOMY WITH INTRAOPERATIVE CHOLANGIOGRAM;  Surgeon: Ernestene Mention, MD;  Location: [facility] OR;  Service: General;  Laterality: N/A;   COLON SURGERY     COLONOSCOPY     Hx: of   CYSTOSCOPY WITH BIOPSY N/A 12/01/2019   Procedure: CYSTOSCOPY WITH BLADDER BIOPSY AND FULGURATION;  Surgeon: Crista Elliot, MD;  Location: [facility] ORS;  Service: Urology;  Laterality: N/A;   ESOPHAGOGASTRODUODENOSCOPY N/A 11/30/2020   Procedure: ESOPHAGOGASTRODUODENOSCOPY (EGD);  Surgeon: Kerin Salen, MD;  Location: [facility] ENDOSCOPY;  Service: Gastroenterology;  Laterality: N/A;   ESOPHAGOGASTRODUODENOSCOPY N/A 03/03/2022   Procedure: ESOPHAGOGASTRODUODENOSCOPY (EGD);  Surgeon: Vida Rigger, MD;  Location: [facility] ENDOSCOPY;  Service: Gastroenterology;  Laterality: N/A;   EXCISIONAL HEMORRHOIDECTOMY  2000's   EXTRACORPOREAL SHOCK WAVE LITHOTRIPSY Right 10/03/2018   Procedure: EXTRACORPOREAL SHOCK WAVE LITHOTRIPSY (ESWL);  Surgeon: Jerilee Field, MD;  Location: [facility] ORS;  Service: Urology;  Laterality: Right;   EYE SURGERY     INGUINAL HERNIA REPAIR Right 1970's   IR RADIOLOGIST EVAL & MGMT  04/06/2020   LAPAROSCOPIC  CHOLECYSTECTOMY  07/25/2013   w/LOA (07/25/2013)    Social History:  reports that he has never smoked. He has never used smokeless tobacco. He reports that he does not drink alcohol and does not use drugs.   Allergies  Allergen Reactions   Insulin Glargine-Lixisenatide Other (See Comments) and Nausea And Vomiting    Stomach cramps   Metformin And Related Nausea And Vomiting and Other (See Comments)    Gi intolerance   Nsaids Other (See Comments)    Renal insufficiency    Family History  Problem Relation Age of Onset   Heart failure Mother    Hypertension Sister    Diabetes Sister    Diabetes Brother    Hypertension Brother     Family history reviewed and not pertinent    Prior to Admission medications   Medication Sig Start Date End Date Taking? Authorizing Provider  acetaminophen (TYLENOL) 325 MG tablet Take 2 tablets (650 mg total) by mouth every 6 (six) hours as needed for mild pain (or Fever >/= 101). 11/27/20   Calvert Cantor, MD  amLODipine (NORVASC) 2.5 MG tablet Take 1 tablet (2.5 mg total) by mouth daily. 08/11/22   Azucena Fallen, MD  aspirin EC 81 MG tablet Take 1 tablet (81 mg total) by mouth daily. Swallow whole. 04/06/22   Azucena Fallen, MD  atorvastatin (LIPITOR) 20 MG tablet Take 20 mg by mouth every evening.  11/03/13   [provider]  B Complex Vitamins (B COMPLEX PO) Take 1 tablet by mouth at bedtime.    [provider]  brimonidine (ALPHAGAN) 0.2 % ophthalmic solution Place 1 drop into both eyes 3 (three) times daily.    [provider]  cholecalciferol (VITAMIN D3) 10 MCG (400 UNIT) TABS tablet Take 400 Units by mouth daily.    [provider]  dorzolamide-timolol (COSOPT) 22.3-6.8 MG/ML ophthalmic solution Place 1 drop into both eyes 2 (two) times daily. 03/04/20   [provider]  Finerenone 10 MG TABS Take 1 tablet by mouth daily.    [provider]  latanoprost (XALATAN) 0.005 % ophthalmic  solution Place 1 drop into both eyes at bedtime.    [provider]  levothyroxine (SYNTHROID) 88 MCG tablet Take 88 mcg by mouth daily before breakfast.    [provider]  memantine (NAMENDA) 10 MG tablet Take 10 mg by mouth in the morning and at bedtime. 11/04/20   [provider]  mirtazapine (REMERON SOL-TAB) 15 MG disintegrating tablet Take 1 tablet (15 mg total) by mouth at bedtime. 03/09/22   Joseph Art, DO  pantoprazole (PROTONIX) 40 MG tablet Take 1 tablet (40 mg total) by mouth daily. 07/08/22   Sherian Maroon A, PA  polyethylene glycol (MIRALAX / GLYCOLAX) 17 g packet Take 17 g by mouth daily. 09/05/22   Rodolph Bong, MD  RHOPRESSA 0.02 % SOLN Place 1 drop into both eyes in the morning and at bedtime. 03/05/20   [provider]  senna-docusate (SENOKOT-S) 8.6-50 MG tablet Take 1 tablet by mouth 2 (two) times daily. 09/05/22   Rodolph Bong, MD  Tamsulosin HCl (FLOMAX) 0.4 MG CAPS Take 0.4 mg by mouth daily.    [provider]     Objective    Physical Exam: Vitals:   10/10/22 0200 10/10/22 0202 10/10/22 0300 10/10/22 0400  BP: (!) 159/85  (!) 158/90 (!) 175/104  Pulse: (!) 108   (!) 114  Resp: 15  15 20   Temp:  97.8 F (36.6 C)    TempSrc:  Oral    SpO2: 100%   100%    General: appears to be stated age; confused Skin: warm, dry, no rash Head:  AT/[place] Mouth:  Oral mucosa membranes appear dry, normal dentition Neck: supple; trachea midline Heart:  RRR; did not appreciate any M/R/G Lungs: CTAB, did not appreciate any wheezes, rales, or rhonchi Abdomen: + BS; soft, ND, NT Vascular: 2+ pedal pulses b/l; 2+ radial pulses b/l Extremities: no peripheral edema, no muscle wasting Neuro: strength and sensation intact in upper and lower extremities b/l    Labs on Admission: I have personally reviewed following labs and imaging studies  CBC: Recent Labs  Lab 10/09/22 1951 10/09/22 2017  WBC 12.0*  --   NEUTROABS  10.2*  --   HGB 13.2 14.3  HCT 39.4 42.0  MCV 91.4  --   PLT 258  --    Basic Metabolic Panel: Recent Labs  Lab 10/09/22 1951 10/09/22 2017 10/09/22 2350  NA 136 138  --   K 5.4* 5.8* 4.9  CL 101  --   --   CO2 23  --   --   GLUCOSE 251*  --   --   BUN 20  --   --   CREATININE 1.32*  --   --   CALCIUM 9.8  --   --    GFR: CrCl cannot  be calculated (Unknown ideal weight.). Liver Function Tests: Recent Labs  Lab 10/09/22 1951  AST 27  ALT 17  ALKPHOS 142*  BILITOT 1.5*  PROT 8.1  ALBUMIN 3.6   No results for input(s): "LIPASE", "AMYLASE" in the last 168 hours. Recent Labs  Lab 10/09/22 1951  AMMONIA 36*   Coagulation Profile: No results for input(s): "INR", "PROTIME" in the last 168 hours. Cardiac Enzymes: No results for input(s): "CKTOTAL", "CKMB", "CKMBINDEX", "TROPONINI" in the last 168 hours. BNP (last 3 results) No results for input(s): "PROBNP" in the last 8760 hours. HbA1C: No results for input(s): "HGBA1C" in the last 72 hours. CBG: No results for input(s): "GLUCAP" in the last 168 hours. Lipid Profile: No results for input(s): "CHOL", "HDL", "LDLCALC", "TRIG", "CHOLHDL", "LDLDIRECT" in the last 72 hours. Thyroid Function Tests: No results for input(s): "TSH", "T4TOTAL", "FREET4", "T3FREE", "THYROIDAB" in the last 72 hours. Anemia Panel: No results for input(s): "VITAMINB12", "FOLATE", "FERRITIN", "TIBC", "IRON", "RETICCTPCT" in the last 72 hours. Urine analysis:    Component Value Date/Time   COLORURINE YELLOW 10/10/2022 0247   APPEARANCEUR HAZY (A) 10/10/2022 0247   LABSPEC 1.042 (H) 10/10/2022 0247   PHURINE 5.0 10/10/2022 0247   GLUCOSEU NEGATIVE 10/10/2022 0247   HGBUR LARGE (A) 10/10/2022 0247   BILIRUBINUR NEGATIVE 10/10/2022 0247   KETONESUR NEGATIVE 10/10/2022 0247   PROTEINUR 100 (A) 10/10/2022 0247   UROBILINOGEN 0.2 10/25/2014 1520   NITRITE NEGATIVE 10/10/2022 0247   LEUKOCYTESUR LARGE (A) 10/10/2022 0247    Radiological  Exams on Admission: MR BRAIN WO CONTRAST  Result Date: 10/10/2022 CLINICAL DATA:  Initial evaluation for mental status change, unknown cause. EXAM: MRI HEAD WITHOUT CONTRAST TECHNIQUE: Multiplanar, multiecho pulse sequences of the brain and surrounding structures were obtained without intravenous contrast. COMPARISON:  CT from 10/09/2022. FINDINGS: Brain: Examination degraded by motion artifact. Generalized age-related cerebral atrophy. Patchy T2/FLAIR hyperintensity involving the periventricular and deep white matter both cerebral hemispheres, most characteristic of chronic microvascular ischemic disease, moderately advanced in nature. Remote infarcts involving the left thalamus and bilateral cerebellar hemispheres noted. No evidence for acute or subacute infarct. Gray-white matter differentiation maintained. No acute or chronic intracranial blood products. No mass lesion, mass effect or midline shift. Diffuse ventriculomegaly somewhat out of proportion to cortical sulcation, similar to prior. No extra-axial fluid collection. Pituitary gland and suprasellar region grossly within normal limits. Vascular: Major intracranial vascular flow voids are maintained. Skull and upper cervical spine: Degenerative osteoarthritic changes noted about the skull base. Craniocervical junction otherwise unremarkable. Bone marrow signal intensity within normal limits. No scalp soft tissue abnormality. Sinuses/Orbits: Prior bilateral ocular lens replacement. Paranasal sinuses are largely clear. Small to moderate right with small left mastoid effusions. Visualized nasopharynx unremarkable. Other: None. IMPRESSION: 1. No acute intracranial abnormality. 2. Age-related cerebral atrophy with moderate chronic microvascular ischemic disease. 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 3. Ventriculomegaly somewhat out of proportion to cortical sulcation. While this finding could be related to underlying atrophy, a degree of  NPH could be contributory, and could be considered in the correct clinical setting. Electronically Signed   By: Rise Mu M.D.   On: 10/10/2022 02:20   CT ABDOMEN PELVIS W CONTRAST  Result Date: 10/09/2022 CLINICAL DATA:  Right lower quadrant abdominal pain. Altered mental status. EXAM: CT ABDOMEN AND PELVIS WITH CONTRAST TECHNIQUE: Multidetector CT imaging of the abdomen and pelvis was performed using the standard protocol following bolus administration of intravenous contrast. RADIATION DOSE REDUCTION: This exam was performed according to  the departmental dose-optimization program which includes automated exposure control, adjustment of the mA and/or kV according to patient size and/or use of iterative reconstruction technique. CONTRAST:  75mL OMNIPAQUE IOHEXOL 350 MG/ML SOLN COMPARISON:  08/29/2022 FINDINGS: Lower chest: Small right pleural effusion with mild right pleural thickening and calcification. Effusion is improved since prior study. Wall thickening in the distal esophagus suggesting reflux disease or esophagitis. No proximal dilatation. Epiphrenic diverticulum of the distal esophagus. Hepatobiliary: No focal liver abnormality is seen. Status post cholecystectomy. No biliary dilatation. Pancreas: Unremarkable. No pancreatic ductal dilatation or surrounding inflammatory changes. Spleen: Normal in size without focal abnormality. Adrenals/Urinary Tract: No adrenal gland nodules. Kidneys are atrophic and scarred. Nephrograms are symmetrical. Bilateral renal cysts, largest on the left measuring 3.9 cm diameter. No change since prior study. No imaging follow-up is indicated. No hydronephrosis or hydroureter. Bladder is decompressed. Suggestion of asymmetrical bladder wall thickening to the left, likely due to under distention. No significant wall thickening was notice on prior study with better distention. Stomach/Bowel: Stomach, small bowel, and colon are not abnormally distended. No wall  thickening or inflammatory changes. Colon is diffusely stool-filled suggesting possible constipation. Clinical correlation is suggested. Appendix is normal. Vascular/Lymphatic: No significant vascular findings are present. No enlarged abdominal or pelvic lymph nodes. Reproductive: Prostate gland is markedly enlarged, measuring 6.2 cm diameter. Other: No free air or free fluid in the abdomen. Abdominal wall musculature appears intact. Fatty atrophy of the right hip musculature. Musculoskeletal: Degenerative changes and scoliosis of the lumbar spine. IMPRESSION: 1. Prominent stool-filled colon suggesting constipation. Clinical correlation suggested. No evidence of bowel obstruction. 2. Esophageal wall thickening likely indicating reflux disease with small epiphrenic diverticulum. 3. Decreasing size of right pleural effusion. Persistent finding of pleural thickening and calcification, likely chronic. 4. Bladder wall thickening is likely due to under distention as this was not present on previous study. 5. Marked enlargement of the prostate gland. Electronically Signed   By: Burman Nieves M.D.   On: 10/09/2022 22:43   CT HEAD WO CONTRAST  Result Date: 10/09/2022 CLINICAL DATA:  Mental status change, unknown cause. EXAM: CT HEAD WITHOUT CONTRAST TECHNIQUE: Contiguous axial images were obtained from the base of the skull through the vertex without intravenous contrast. RADIATION DOSE REDUCTION: This exam was performed according to the departmental dose-optimization program which includes automated exposure control, adjustment of the mA and/or kV according to patient size and/or use of iterative reconstruction technique. COMPARISON:  08/21/2022. FINDINGS: Brain: No acute intracranial hemorrhage, midline shift or mass effect. No extra-axial fluid collection. Extensive subcortical and periventricular white matter hypodensities are present bilaterally. Diffuse atrophy is noted. Old lacunar infarcts are present in the  cerebellar hemispheres. There is mild hydrocephalus out of proportion to degree of atrophy, unchanged from the previous exam. Vascular: No hyperdense vessel or unexpected calcification. Skull: Normal. Negative for fracture or focal lesion. Sinuses/Orbits: No acute finding. Other: None. IMPRESSION: 1. No acute intracranial process. 2. Atrophy with chronic microvascular ischemic changes. 3. Stable ventriculomegaly, which may be associated with normal pressure hydrocephalus. Electronically Signed   By: Thornell Sartorius M.D.   On: 10/09/2022 20:24   DG Chest Port 1 View  Result Date: 10/09/2022 CLINICAL DATA:  Altered mental status EXAM: PORTABLE CHEST 1 VIEW COMPARISON:  08/29/2022 FINDINGS: Shallow inspiration. Mild blunting of costophrenic angles suggesting small pleural effusions, decreased since prior study. Atelectasis in the lung bases. Heart size and pulmonary vascularity are normal. No pneumothorax. Mediastinal contours appear intact. IMPRESSION: Shallow inspiration. Blunting of costophrenic angles  suggesting small effusions, decreased since prior study. Electronically Signed   By: Burman Nieves M.D.   On: 10/09/2022 19:37      Assessment/Plan   Principal Problem:   Acute metabolic encephalopathy Active Problems:   Essential hypertension   BPH (benign prostatic hyperplasia)   DM2 (diabetes mellitus, type 2)   Severe sepsis   Acquired hypothyroidism   Acute cystitis   Hyperkalemia   Dehydration      #) Acute metabolic encephalopathy: 1 day of confusion, altered mental status, somnolence relative to baseline mental status, which appears to be on the basis of physiologic stressors stemming from presenting severe sepsis due to urinary tract infection, as further detailed below.  No obvious additional contributory underlying infectious process at this time, including chest x-ray that shows no evidence of acute cardiopulmonary process clinically no evidence of infiltrate, while CT  abdomen/pelvis shows evidence of interval acute cystitis, but otherwise no evidence of interval infectious process.  There may also be a secondary contribution from clinical evidence of dehydration, as further detailed below.  Otherwise, no additional overt metabolic/electrolyte contributions at this time.  No obvious contributory pharmacologic factors. No overt acute focal neurologic deficits to suggest a contribution from an underlying acute CVA, while MRI brain shows no evidence of acute infarct. Seizures are also felt to be less likely. Will check VBG to evaluate for any contribution from hypercapnic encephalopathy.   Plan: fall precautions.  Delirium precautions.  Repeat CMP/CBC in the AM. Check magnesium level. check TSH, vbg .  Check INR.  Gentle IV fluids in the form of normal saline at 50 cc/h x 8 hours.              #) Severe sepsis due to acute cystitis: In the setting of presenting 1 day of progressive altered mental status, urinalysis suggestive of UTI antibiotics of hazy appearing specimen notable for significant pyuria, large leukocyte esterase, and no evidence this comes without cells to suggest a contaminated specimen.  Additionally, CT abdomen/pelvis shows interval development of bladder wall thickening consistent with a picture of acute cystitis.   SIRS criteria met via leukocytosis, tachycardia. Lactic acid level: 2.0. Of note, given the associated presence of suspected end organ damage in the form of concominant presenting elevated lactate as well as presenting acute metabolic encephalopathy, criteria are met for pt's sepsis to be considered severe in nature. However, in the absence of lactic acid level that is greater than or equal to 4.0, and in the absence of any associated hypotension refractory to IVF's, there are no indications for administration of a 30 mL/kg IVF bolus at this time.   Additional ED work-up/management notable for: Blood cultures x 2 and urine culture  were collected prior to initiation of Rocephin, which we will be continued for coverage of acute cystitis.  No e/o additional infectious process at this time, including chest x-ray which shows no evidence of acute cardiopulmonary process, including no evidence of infiltrate, while CT abdomen/pelvis shows no evidence of acute infectious process aside from interval development of bladder wall thickening consistent with acute cystitis, as above.   Plan: CBC w/ diff and CMP in AM.  Follow for results of blood cx's x 2.  Follow-up results of urine culture.  Abx: Continue Rocephin.  Normal saline at 50 cc/h x 8 hours.  Repeat lactic acid level.  Add on procalcitonin level.              #) Hyperkalemia: Very mild elevation of initial potassium  level noted to be 5.4, likely with multifactorial contributions, including pharmacologic ramifications of outpatient potassium sparing diuretic, will also suspecting concentrating element of dehydration, as further detailed below.  Following dose of Lokelma as well as 1 L LR bolus in the ED, repeat serum potassium level has improved to 4.9.  Of note, EKG without associated hyperkalemic findings.  Plan: Gentle IV fluids, as above for repeat CMP in the morning.  Add on serum magnesium level.  Monitor on symmetry.  Hold home finerenone for now.                  #) Dehydration: Clinical suspicion for such, including the appearance of dry oral mucous membranes as well as laboratory findings notable for UA demonstrating elevated specific gravity.  Appears to be in the setting of   diminished oral intake over the course of the last day as a consequence of the patient's altered mental status and increased somnolence over that time..  No e/o associated hypotension.   Plan: Monitor strict I's and O's.  Daily weights.  CMP in the morning. IVF's, as above.                  #) Benign Prostatic Hyperplasia:  documented h/o such; on  tamsulosin as outpatient.  His history of BPH is notable in the context of presenting acute cystitis, as a increased risk factor for development of such due to relative induction of urinary stasis.  Plan: monitor strict I's & O's and daily weights. Repeat CMP in AM.  Continue outpatient tamsulosin.             #) Essential Hypertension: documented h/o such, with outpatient antihypertensive regimen including finerenone, amlodipine.  In the context of severe sepsis will hold home amlodipine for now.  In the context of presenting hyperkalemia, will hold home finerenone for now.  Plan: Close monitoring of subsequent BP via routine VS. hold home and hypertensive medications for now, as above.                 #) Type 2 Diabetes Mellitus: documented history of such.  Appears to be managed for lifestyle modifications in the absence of any outpatient insulin or oral hypoglycemic agents.  Presenting blood sugar: 251. Most recent A1c noted to be 5.5% when checked in October 2023.   Plan: accuchecks QAC and HS with low dose SSI.  Add on hemoglobin A1c level.               #) acquired hypothyroidism: documented h/o such, on Synthroid as outpatient.  In the context of presenting altered mental status, will also add on TSH level.  Plan: cont home Synthroid.  Check TSH level.        DVT prophylaxis: SCD's   Code Status: DNR, consistent with CODE STATUS documentation from each of the last several hospitalizations Family Communication: Case discussed with patient's wife, as further detailed above Disposition Plan: Per Rounding Team Consults called: none;  Admission status: Inpatient     I SPENT GREATER THAN 75  MINUTES IN CLINICAL CARE TIME/MEDICAL DECISION-MAKING IN COMPLETING THIS ADMISSION.      Chaney Born Redding Cloe DO [facility]  From 7PM - 7AM   10/10/2022, 4:50 AM

## 2022-10-10 NOTE — TOC Initial Note (Signed)
Transition of Care [facility]) - Initial/Assessment Note    Patient Details  Name: Mike Mack MRN: [id number] Date of Birth: January 22, 1936  [facility] CM/SW Contact:    Gordy Clement, RN Phone Number: 10/10/2022, 3:41 PM  Clinical Narrative:     CM went to bedside to complete Initial Assessment .  Patient was sleeping.  IA completed via chart review. Patient is from Home and lives with Wife and Lucila Maine who perform total care of patient.  Patient is currently under home hospice services with [facility] .  [facility] liaison aware of hospital status.  PT has recommended a mattress air overlay for pressure relief/skin integrity at home due to history of wounds. [facility] liaison made aware of this need .   TOC will continue to follow patient for any additional discharge needs     Expected Discharge Plan: Home w Hospice Care ([facility] from Home with [facility] services) Barriers to Discharge: Continued Medical Work up   Patient Goals and CMS Choice            Expected Discharge Plan and Services   Discharge Planning Services: CM Consult   Living arrangements for the past 2 months: Single Family Home                 DME Arranged:  (TBD) DME Agency: [employer]     Representative spoke with at DME Agency: Trena Platt   Liaison made aware Patient is here [facility] Arranged:  (TBD  May return home with Hospice  Recs TBD) HH Agency:  ([facility]) Date [facility] Agency Contacted: 10/10/22 Time HH Agency Contacted: [phone] Representative spoke with at [facility] Agency: Higher education careers adviser [employer]  Prior Living Arrangements/Services Living arrangements for the past 2 months: Single Family Home Lives with:: Spouse, Other (Comment) (Adult Grandson) Patient language and need for interpreter reviewed:: Yes        Need for Family Participation in Patient Care: Yes (Comment) Care giver support system in place?: Yes (comment) Current home services: Hospice Criminal Activity/Legal Involvement  Pertinent to Current Situation/Hospitalization: No - Comment as needed  Activities of Daily Living      Permission Sought/Granted Permission sought to share information with : Case Manager Permission granted to share information with : Yes, Verbal Permission Granted     Permission granted to share info w AGENCY: [facility]        Emotional Assessment Appearance:: Appears stated age Attitude/Demeanor/Rapport: Unable to Assess Affect (typically observed): Unable to Assess Orientation: :  (Patient was sleeping) Alcohol / Substance Use: Not Applicable Psych Involvement: No (comment)  Admission diagnosis:  Hyperkalemia [E87.5] Constipation, unspecified constipation type [K59.00] Altered mental status, unspecified altered mental status type [R41.82] Acute metabolic encephalopathy [G93.41] Patient Active Problem List   Diagnosis Date Noted   Acute metabolic encephalopathy 10/10/2022   Acute cystitis 10/10/2022   Hyperkalemia 10/10/2022   Dehydration 10/10/2022   Acquired hypothyroidism 08/30/2022   Urinary tract infection due to ESBL Klebsiella 08/30/2022   AKI (acute kidney injury) 08/29/2022   Hypotension 08/29/2022   Acute encephalopathy 08/21/2022   Glaucoma 08/08/2022   recent GI bleed in 02/2022 04/01/2022   Right tibial fracture 04/01/2022   Pressure injury of skin 03/09/2022   Severe sepsis 02/28/2022   Pleural effusion, bilateral 10/21/2021   Malnutrition of moderate degree 02/23/2021   Acute lower UTI 02/22/2021   CKD (chronic kidney disease) stage 3, GFR 30-59 ml/min 02/21/2021   Dementia 11/27/2020   Polio osteopathy of lower leg, 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  Subphrenic abscess 03/19/2020   Posterior vitreous detachment of both eyes 12/11/2019   Syncope 10/25/2014   H/O: CVA (cerebrovascular accident) 10/25/2014   H/O poliomyelitis 10/25/2014   DM2 (diabetes mellitus, type 2) 10/25/2014   Tachycardia 08/29/2013   Cough 08/29/2013   Monoparesis of leg (HCC)  08/20/2013   Muscle weakness of lower extremity 08/20/2013   Right upper quadrant abdominal abscess 08/15/2013   Hepatic abscess 08/15/2013   Subacute delirium 08/14/2013   Oral thrush 08/14/2013   Leukocytosis, unspecified 08/14/2013   Debility 08/14/2013   S/P cholecystectomy 07/29/2013   Cholecystitis with cholelithiasis 07/25/2013   Arthritis 06/06/2013   Headache 06/06/2013   Generalized weakness 05/24/2013   RUQ abdominal pain 05/13/2013   Constipation 05/13/2013   Enterococcus UTI 05/13/2013   Anemia 05/13/2013   Essential hypertension    BPH (benign prostatic hyperplasia)    Pruritic disorder 02/02/2011   PCP:  Georgianne Fick, MD Pharmacy:   [facility] [phone] G[place] Otto,  - [address] [place] [address] Phone: [phone] Fax: [phone]  [facility] [address] [place] [address] Phone: [phone] Fax: [phone]     Social Determinants of Health (SDOH) Social History: SDOH Screenings   Food Insecurity: No Food Insecurity (08/21/2022)  Housing: Low Risk  (08/21/2022)  Transportation Needs: No Transportation Needs (08/21/2022)  Utilities: Not At Risk (08/21/2022)  Tobacco Use: Low Risk  (10/10/2022)   SDOH Interventions:     Readmission Risk Interventions    09/04/2022   10:29 AM 08/24/2022   10:47 AM  Readmission Risk Prevention Plan  Transportation Screening Complete Complete  PCP or Specialist Appt within 3-5 Days  Complete  HRI or Home Care Consult  Complete  Social Work Consult for Recovery Care Planning/Counseling  Complete  Palliative Care Screening  Not Applicable  Medication Review Oceanographer) Complete Referral to Pharmacy  PCP or Specialist appointment within 3-5 days of discharge Complete   HRI or Home Care Consult Complete   SW Recovery Care/Counseling Consult Complete   Palliative Care Screening Not Applicable   Skilled Nursing Facility Not  Applicable

## 2022-10-10 NOTE — ED Provider Notes (Signed)
  Physical Exam  BP (!) 175/104   Pulse (!) 114   Temp 97.8 F (36.6 C) (Oral)   Resp 20   SpO2 100%   Physical Exam Vitals and nursing note reviewed.  Constitutional:      General: He is not in acute distress.    Appearance: He is well-developed. He is not diaphoretic.  HENT:     Head: Normocephalic and atraumatic.  Cardiovascular:     Rate and Rhythm: Normal rate and regular rhythm.     Heart sounds: No murmur heard.    No friction rub.  Pulmonary:     Effort: Pulmonary effort is normal. No respiratory distress.     Breath sounds: Normal breath sounds. No wheezing or rales.  Abdominal:     General: Bowel sounds are normal. There is no distension.     Palpations: Abdomen is soft.     Tenderness: There is no abdominal tenderness.  Musculoskeletal:        General: Normal range of motion.     Cervical back: Normal range of motion and neck supple.  Skin:    General: Skin is warm and dry.  Neurological:     Mental Status: He is alert.     Coordination: Coordination normal.     Comments: Patient is awake and communicative, but does seem disoriented.     Procedures  Procedures  ED Course / MDM    Medical Decision Making Amount and/or Complexity of Data Reviewed Labs: ordered. Radiology: ordered.  Risk Prescription drug management.   Care assumed from Dr. Karene Fry at shift change.  Patient presenting here with altered mental status as described in the previous providers note.  Care signed out to me awaiting results of an MRI of the brain.  This study shows no acute findings other than possible normal pressure hydrocephalus.  Patient also awaiting a urinalysis which is consistent with a UTI.  This combined with a white count of 12 is likely the source of his altered mental status.  I have ordered IV Rocephin and will have the patient admitted to the hospitalist service.       Geoffery Lyons, MD 10/10/22 [phone]

## 2022-10-10 NOTE — ED Notes (Signed)
ED TO INPATIENT HANDOFF REPORT  ED Nurse Name and Phone #: Al Corpus Name/Age/Gender Mike Mack 87 y.o. male Room/Bed: 009C/009C  Code Status   Code Status: DNR  Home/SNF/Other Home Patient oriented to: self Is this baseline? No   Triage Complete: Triage complete  Chief Complaint Acute metabolic encephalopathy [G93.41]  Triage Note Pt to ED via EMS from home with c/o AMS. Per EMS family stated that pt stopped talking, and was drooling. They stated pt is usually alert and oriented at baseline. Family denies any injuries or seizure like activity. Pt arrives to ED alert to self, denies any pain. Pt has previous left side weakness from CVA.    Allergies Allergies  Allergen Reactions   Insulin Glargine    Insulin Glargine-Lixisenatide Other (See Comments) and Nausea And Vomiting    Stomach cramps   Metformin And Related Nausea And Vomiting and Other (See Comments)    Gi intolerance   Nsaids Other (See Comments)    Renal insufficiency    Level of Care/Admitting Diagnosis ED Disposition     ED Disposition  Admit   Condition  --   Comment  Hospital Area: [facility] [[id number]]  Level of Care: Progressive [102]  Admit to Progressive based on following criteria: MULTISYSTEM THREATS such as stable sepsis, metabolic/electrolyte imbalance with or without encephalopathy that is responding to early treatment.  May admit patient to [facility] or [facility] if equivalent level of care is available:: No  Covid Evaluation: Asymptomatic - no recent exposure (last 10 days) testing not required  Diagnosis: Acute metabolic encephalopathy [1610960]  Admitting Physician: Angie Fava [[id number]]  Attending Physician: Angie Fava [[id number]]  Certification:: I certify this patient will need inpatient services for at least 2 midnights  Estimated Length of Stay: 2          B Medical/Surgery History Past Medical History:  Diagnosis Date   Acute  metabolic encephalopathy 08/09/2022   Anemia 05/13/2013   Arthritis    "right leg" (07/25/2013)   BPH (benign prostatic hyperplasia)    Cholecystitis, acute 05/24/2013   Constipation 05/13/2013   Exertional shortness of breath    "sometimes" (07/25/2013)   History of kidney stones    History of stomach ulcers    Hypertension    Hypothermia 04/01/2022   Neuropathy    Polio    Polio Childhood   Small bowel obstruction    Stroke 2014   residual:  "left hand kind of numb" (07/25/2013)   Type II diabetes mellitus    Past Surgical History:  Procedure Laterality Date   BIOPSY  11/30/2020   Procedure: BIOPSY;  Surgeon: Kerin Salen, MD;  Location: [facility] ENDOSCOPY;  Service: Gastroenterology;;   BIOPSY  03/03/2022   Procedure: BIOPSY;  Surgeon: Vida Rigger, MD;  Location: [facility] ENDOSCOPY;  Service: Gastroenterology;;   BOWEL RESECTION     CATARACT EXTRACTION W/ INTRAOCULAR LENS  IMPLANT, BILATERAL Bilateral 2000's   CHOLECYSTECTOMY N/A 07/25/2013   Procedure: LAPAROSCOPIC CHOLECYSTECTOMY WITH INTRAOPERATIVE CHOLANGIOGRAM;  Surgeon: Ernestene Mention, MD;  Location: [facility] OR;  Service: General;  Laterality: N/A;   COLON SURGERY     COLONOSCOPY     Hx: of   CYSTOSCOPY WITH BIOPSY N/A 12/01/2019   Procedure: CYSTOSCOPY WITH BLADDER BIOPSY AND FULGURATION;  Surgeon: Crista Elliot, MD;  Location: [facility] ORS;  Service: Urology;  Laterality: N/A;   ESOPHAGOGASTRODUODENOSCOPY N/A 11/30/2020   Procedure: ESOPHAGOGASTRODUODENOSCOPY (EGD);  Surgeon: Kerin Salen, MD;  Location:  [facility] ENDOSCOPY;  Service: Gastroenterology;  Laterality: N/A;   ESOPHAGOGASTRODUODENOSCOPY N/A 03/03/2022   Procedure: ESOPHAGOGASTRODUODENOSCOPY (EGD);  Surgeon: Vida Rigger, MD;  Location: [facility] ENDOSCOPY;  Service: Gastroenterology;  Laterality: N/A;   EXCISIONAL HEMORRHOIDECTOMY  2000's   EXTRACORPOREAL SHOCK WAVE LITHOTRIPSY Right 10/03/2018   Procedure: EXTRACORPOREAL SHOCK WAVE LITHOTRIPSY (ESWL);  Surgeon: Jerilee Field, MD;  Location: [facility]  ORS;  Service: Urology;  Laterality: Right;   EYE SURGERY     INGUINAL HERNIA REPAIR Right 1970's   IR RADIOLOGIST EVAL & MGMT  04/06/2020   LAPAROSCOPIC CHOLECYSTECTOMY  07/25/2013   w/LOA (07/25/2013)     A IV Location/Drains/Wounds Patient Lines/Drains/Airways Status     Active Line/Drains/Airways     Name Placement date Placement time Site Days   Peripheral IV 10/09/22 18 G 1.16" Right Antecubital 10/09/22  1950  Antecubital  1   Peripheral IV 10/09/22 18 G 1.16" Left Antecubital 10/09/22  2000  Antecubital  1   Wound / Incision (Open or Dehisced) 03/03/22 Irritant Dermatitis (Moisture Associated Skin Damage) Scrotum Left;Right 03/03/22  1000  Scrotum  221   Wound / Incision (Open or Dehisced) 08/30/22 Non-pressure wound Toe (Comment  which one) Anterior;Right;Distal red 08/30/22  1351  Toe (Comment  which one)  41            Intake/Output Last 24 hours  Intake/Output Summary (Last 24 hours) at 10/10/2022 1433 Last data filed at 10/10/2022 1124 Gross per 24 hour  Intake 1392.33 ml  Output --  Net 1392.33 ml    Labs/Imaging Results for orders placed or performed during the hospital encounter of 10/09/22 (from the past 48 hour(s))  Comprehensive metabolic panel     Status: Abnormal   Collection Time: 10/09/22  7:51 PM  Result Value Ref Range   Sodium 136 135 - 145 mmol/L   Potassium 5.4 (H) 3.5 - 5.1 mmol/L   Chloride 101 98 - 111 mmol/L   CO2 23 22 - 32 mmol/L   Glucose, Bld 251 (H) 70 - 99 mg/dL    Comment: Glucose reference range applies only to samples taken after fasting for at least 8 hours.   BUN 20 8 - 23 mg/dL   Creatinine, Ser 1.61 (H) 0.61 - 1.24 mg/dL   Calcium 9.8 8.9 - 09.6 mg/dL   Total Protein 8.1 6.5 - 8.1 g/dL   Albumin 3.6 3.5 - 5.0 g/dL   AST 27 15 - 41 U/L   ALT 17 0 - 44 U/L   Alkaline Phosphatase 142 (H) 38 - 126 U/L   Total Bilirubin 1.5 (H) 0.3 - 1.2 mg/dL   GFR, Estimated 53 (L) >60 mL/min    Comment: (NOTE) Calculated using the CKD-EPI  Creatinine Equation (2021)    Anion gap 12 5 - 15    Comment: Performed at [facility], [address] [place], [place] [address]  CBC with Differential/Platelet     Status: Abnormal   Collection Time: 10/09/22  7:51 PM  Result Value Ref Range   WBC 12.0 (H) 4.0 - 10.5 K/uL   RBC 4.31 4.22 - 5.81 MIL/uL   Hemoglobin 13.2 13.0 - 17.0 g/dL   HCT 98.1 19.1 - 47.8 %   MCV 91.4 80.0 - 100.0 fL   MCH 30.6 26.0 - 34.0 pg   MCHC 33.5 30.0 - 36.0 g/dL   RDW 29.5 62.1 - 30.8 %   Platelets 258 150 - 400 K/uL   nRBC 0.0 0.0 - 0.2 %  Neutrophils Relative % 85 %   Neutro Abs 10.2 (H) 1.7 - 7.7 K/uL   Lymphocytes Relative 11 %   Lymphs Abs 1.3 0.7 - 4.0 K/uL   Monocytes Relative 3 %   Monocytes Absolute 0.4 0.1 - 1.0 K/uL   Eosinophils Relative 0 %   Eosinophils Absolute 0.0 0.0 - 0.5 K/uL   Basophils Relative 0 %   Basophils Absolute 0.0 0.0 - 0.1 K/uL   Immature Granulocytes 1 %   Abs Immature Granulocytes 0.06 0.00 - 0.07 K/uL    Comment: Performed at [facility], [address] [place], [place] [address]  Ammonia     Status: Abnormal   Collection Time: 10/09/22  7:51 PM  Result Value Ref Range   Ammonia 36 (H) 9 - 35 umol/L    Comment: Performed at [facility], [address] [place], [place] [address]  Lactic acid, plasma     Status: Abnormal   Collection Time: 10/09/22  7:51 PM  Result Value Ref Range   Lactic Acid, Venous 2.0 (HH) 0.5 - 1.9 mmol/L    Comment: CRITICAL RESULT CALLED TO, READ BACK BY AND VERIFIED WITH Carleene Cooper 2055 10/09/2022 WBOND Performed at [facility], [address], [place], [place] [address]   Ethanol     Status: None   Collection Time: 10/09/22  7:51 PM  Result Value Ref Range   Alcohol, Ethyl (B) <10 <10 mg/dL    Comment: (NOTE) Lowest detectable limit for serum alcohol is 10 mg/dL.  For medical purposes only. Performed at [facility], [address] [place], [place] [address]   Blood Culture (Routine X  2)     Status: None (Preliminary result)   Collection Time: 10/09/22  7:51 PM   Specimen: BLOOD  Result Value Ref Range   Specimen Description BLOOD RIGHT ANTECUBITAL    Special Requests      BOTTLES DRAWN AEROBIC AND ANAEROBIC Blood Culture adequate volume   Culture      NO GROWTH < 12 HOURS Performed at [facility], [address] [place], [place] [address]    Report Status PENDING   Blood Culture (Routine X 2)     Status: None (Preliminary result)   Collection Time: 10/09/22  8:06 PM   Specimen: BLOOD  Result Value Ref Range   Specimen Description BLOOD LEFT ANTECUBITAL    Special Requests      BOTTLES DRAWN AEROBIC AND ANAEROBIC Blood Culture results may not be optimal due to an excessive volume of blood received in culture bottles   Culture      NO GROWTH < 12 HOURS Performed at [facility], [address] [place], [place] [address]    Report Status PENDING   I-Stat venous blood gas, ED     Status: Abnormal   Collection Time: 10/09/22  8:17 PM  Result Value Ref Range   pH, Ven 7.312 7.25 - 7.43   pCO2, Ven 48.3 44 - 60 mmHg   pO2, Ven 33 32 - 45 mmHg   Bicarbonate 24.4 20.0 - 28.0 mmol/L   TCO2 26 22 - 32 mmol/L   O2 Saturation 57 %   Acid-base deficit 2.0 0.0 - 2.0 mmol/L   Sodium 138 135 - 145 mmol/L   Potassium 5.8 (H) 3.5 - 5.1 mmol/L   Calcium, Ion 1.23 1.15 - 1.40 mmol/L   HCT 42.0 39.0 - 52.0 %   Hemoglobin 14.3 13.0 -  17.0 g/dL   Sample type VENOUS    Comment NOTIFIED PHYSICIAN   Potassium     Status: None   Collection Time: 10/09/22 11:50 PM  Result Value Ref Range   Potassium 4.9 3.5 - 5.1 mmol/L    Comment: Performed at [facility], [address] [place], [place] [address]  Bilirubin, direct     Status: None   Collection Time: 10/09/22 11:50 PM  Result Value Ref Range   Bilirubin, Direct 0.1 0.0 - 0.2 mg/dL    Comment: Performed at [facility], [address] [place], [place] [address]  Rapid urine drug screen  (hospital performed)     Status: None   Collection Time: 10/10/22  2:47 AM  Result Value Ref Range   Opiates NONE DETECTED NONE DETECTED   Cocaine NONE DETECTED NONE DETECTED   Benzodiazepines NONE DETECTED NONE DETECTED   Amphetamines NONE DETECTED NONE DETECTED   Tetrahydrocannabinol NONE DETECTED NONE DETECTED   Barbiturates NONE DETECTED NONE DETECTED    Comment: (NOTE) DRUG SCREEN FOR MEDICAL PURPOSES ONLY.  IF CONFIRMATION IS NEEDED FOR ANY PURPOSE, NOTIFY LAB WITHIN 5 DAYS.  LOWEST DETECTABLE LIMITS FOR URINE DRUG SCREEN Drug Class                     Cutoff (ng/mL) Amphetamine and metabolites    1000 Barbiturate and metabolites    200 Benzodiazepine                 200 Opiates and metabolites        300 Cocaine and metabolites        300 THC                            50 Performed at [facility], [address], [place], [place] [address]   Urinalysis, w/ Reflex to Culture (Infection Suspected) -Urine, Clean Catch     Status: Abnormal   Collection Time: 10/10/22  2:47 AM  Result Value Ref Range   Specimen Source URINE, CLEAN CATCH    Color, Urine YELLOW YELLOW   APPearance HAZY (A) CLEAR   Specific Gravity, Urine 1.042 (H) 1.005 - 1.030   pH 5.0 5.0 - 8.0   Glucose, UA NEGATIVE NEGATIVE mg/dL   Hgb urine dipstick LARGE (A) NEGATIVE   Bilirubin Urine NEGATIVE NEGATIVE   Ketones, ur NEGATIVE NEGATIVE mg/dL   Protein, ur 213 (A) NEGATIVE mg/dL   Nitrite NEGATIVE NEGATIVE   Leukocytes,Ua LARGE (A) NEGATIVE   RBC / HPF >50 0 - 5 RBC/hpf   WBC, UA >50 0 - 5 WBC/hpf    Comment:        Reflex urine culture not performed if WBC <=10, OR if Squamous epithelial cells >5. If Squamous epithelial cells >5 suggest recollection.    Bacteria, UA NONE SEEN NONE SEEN   Squamous Epithelial / HPF 0-5 0 - 5 /HPF   Budding Yeast PRESENT     Comment: Performed at [facility], [address], [place], [place] [address]  CBC with Differential/Platelet     Status:  Abnormal   Collection Time: 10/10/22  4:21 AM  Result Value Ref Range   WBC 9.7 4.0 - 10.5 K/uL   RBC 3.69 (L) 4.22 - 5.81 MIL/uL   Hemoglobin 11.2 (L) 13.0 - 17.0 g/dL   HCT 84.6 (L) 96.2 - 95.2 %   MCV 89.2 80.0 -  100.0 fL   MCH 30.4 26.0 - 34.0 pg   MCHC 34.0 30.0 - 36.0 g/dL   RDW 16.1 09.6 - 04.5 %   Platelets 248 150 - 400 K/uL   nRBC 0.0 0.0 - 0.2 %   Neutrophils Relative % 75 %   Neutro Abs 7.2 1.7 - 7.7 K/uL   Lymphocytes Relative 20 %   Lymphs Abs 2.0 0.7 - 4.0 K/uL   Monocytes Relative 5 %   Monocytes Absolute 0.5 0.1 - 1.0 K/uL   Eosinophils Relative 0 %   Eosinophils Absolute 0.0 0.0 - 0.5 K/uL   Basophils Relative 0 %   Basophils Absolute 0.0 0.0 - 0.1 K/uL   Immature Granulocytes 0 %   Abs Immature Granulocytes 0.03 0.00 - 0.07 K/uL    Comment: Performed at [facility], [address] [place], [place] [address]  Comprehensive metabolic panel     Status: Abnormal   Collection Time: 10/10/22  4:21 AM  Result Value Ref Range   Sodium 136 135 - 145 mmol/L   Potassium 3.9 3.5 - 5.1 mmol/L   Chloride 104 98 - 111 mmol/L   CO2 24 22 - 32 mmol/L   Glucose, Bld 199 (H) 70 - 99 mg/dL    Comment: Glucose reference range applies only to samples taken after fasting for at least 8 hours.   BUN 18 8 - 23 mg/dL   Creatinine, Ser 1.91 0.61 - 1.24 mg/dL   Calcium 9.0 8.9 - 47.8 mg/dL   Total Protein 6.8 6.5 - 8.1 g/dL   Albumin 3.0 (L) 3.5 - 5.0 g/dL   AST 15 15 - 41 U/L   ALT 13 0 - 44 U/L   Alkaline Phosphatase 121 38 - 126 U/L   Total Bilirubin 0.7 0.3 - 1.2 mg/dL   GFR, Estimated 58 (L) >60 mL/min    Comment: (NOTE) Calculated using the CKD-EPI Creatinine Equation (2021)    Anion gap 8 5 - 15    Comment: Performed at [facility], [address], [place], [place] [address]  Magnesium     Status: None   Collection Time: 10/10/22  4:21 AM  Result Value Ref Range   Magnesium 1.7 1.7 - 2.4 mg/dL    Comment: Performed at [facility], [address], [place], [place] [address]  Procalcitonin     Status: None   Collection Time: 10/10/22  4:21 AM  Result Value Ref Range   Procalcitonin 0.15 ng/mL    Comment:        Interpretation: PCT (Procalcitonin) <= 0.5 ng/mL: Systemic infection (sepsis) is not likely. Local bacterial infection is possible. (NOTE)       Sepsis PCT Algorithm           Lower Respiratory Tract                                      Infection PCT Algorithm    ----------------------------     ----------------------------         PCT < 0.25 ng/mL                PCT < 0.10 ng/mL          Strongly encourage             Strongly discourage   discontinuation of antibiotics    initiation of antibiotics    ----------------------------     -----------------------------  PCT 0.25 - 0.50 ng/mL            PCT 0.10 - 0.25 ng/mL               OR       >80% decrease in PCT            Discourage initiation of                                            antibiotics      Encourage discontinuation           of antibiotics    ----------------------------     -----------------------------         PCT >= 0.50 ng/mL              PCT 0.26 - 0.50 ng/mL               AND        <80% decrease in PCT             Encourage initiation of                                             antibiotics       Encourage continuation           of antibiotics    ----------------------------     -----------------------------        PCT >= 0.50 ng/mL                  PCT > 0.50 ng/mL               AND         increase in PCT                  Strongly encourage                                      initiation of antibiotics    Strongly encourage escalation           of antibiotics                                     -----------------------------                                           PCT <= 0.25 ng/mL                                                 OR                                        > 80% decrease in PCT  Discontinue / Do not initiate                                             antibiotics  Performed at [facility], [address], [place], [place] [address]   TSH     Status: Abnormal   Collection Time: 10/10/22  4:33 AM  Result Value Ref Range   TSH 0.264 (L) 0.350 - 4.500 uIU/mL    Comment: Performed by a 3rd Generation assay with a functional sensitivity of <=0.01 uIU/mL. Performed at [facility], [address], [place], [place] [address]   Hemoglobin A1c     Status: Abnormal   Collection Time: 10/10/22  4:33 AM  Result Value Ref Range   Hgb A1c MFr Bld 6.8 (H) 4.8 - 5.6 %    Comment: (NOTE) Pre diabetes:          5.7%-6.4%  Diabetes:              >6.4%  Glycemic control for   <7.0% adults with diabetes    Mean Plasma Glucose 148.46 mg/dL    Comment: Performed at [facility], [address] [place], [place] [address]  Lactic acid, plasma     Status: None   Collection Time: 10/10/22  4:38 AM  Result Value Ref Range   Lactic Acid, Venous 1.4 0.5 - 1.9 mmol/L    Comment: Performed at [facility], [address], [place], [place] [address]  Protime-INR     Status: Abnormal   Collection Time: 10/10/22  5:10 AM  Result Value Ref Range   Prothrombin Time 16.0 (H) 11.4 - 15.2 seconds   INR 1.3 (H) 0.8 - 1.2    Comment: (NOTE) INR goal varies based on device and disease states. Performed at [facility], [address] [place], [place] [address]   CBG monitoring, ED     Status: Abnormal   Collection Time: 10/10/22  7:44 AM  Result Value Ref Range   Glucose-Capillary 138 (H) 70 - 99 mg/dL    Comment: Glucose reference range applies only to samples taken after fasting for at least 8 hours.  CBG monitoring, ED     Status: Abnormal   Collection Time: 10/10/22 12:08 PM  Result Value Ref Range   Glucose-Capillary 171 (H) 70 - 99 mg/dL    Comment: Glucose reference range applies only to samples taken after fasting for at least 8 hours.    MR BRAIN WO CONTRAST  Result Date: 10/10/2022 CLINICAL DATA:  Initial evaluation for mental status change, unknown cause. EXAM: MRI HEAD WITHOUT CONTRAST TECHNIQUE: Multiplanar, multiecho pulse sequences of the brain and surrounding structures were obtained without intravenous contrast. COMPARISON:  CT from 10/09/2022. FINDINGS: Brain: Examination degraded by motion artifact. Generalized age-related cerebral atrophy. Patchy T2/FLAIR hyperintensity involving the periventricular and deep white matter both cerebral hemispheres, most characteristic of chronic microvascular ischemic disease, moderately advanced in nature. Remote infarcts involving the left thalamus and bilateral cerebellar hemispheres noted. No evidence for acute or subacute infarct. Gray-white matter differentiation maintained. No acute or chronic intracranial blood products. No mass lesion, mass effect or midline shift. Diffuse ventriculomegaly somewhat out of proportion to cortical sulcation, similar to prior. No extra-axial fluid collection. Pituitary gland and suprasellar region grossly within normal limits. Vascular: Major intracranial vascular flow voids are maintained. Skull  and upper cervical spine: Degenerative osteoarthritic changes noted about the skull base. Craniocervical junction otherwise unremarkable. Bone marrow signal intensity within normal limits. No scalp soft tissue abnormality. Sinuses/Orbits: Prior bilateral ocular lens replacement. Paranasal sinuses are largely clear. Small to moderate right with small left mastoid effusions. Visualized nasopharynx unremarkable. Other: None. IMPRESSION: 1. No acute intracranial abnormality. 2. Age-related cerebral atrophy with moderate chronic microvascular ischemic disease. 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 3. Ventriculomegaly somewhat out of proportion to cortical sulcation. While this finding could be related to underlying atrophy, a degree of NPH could be  contributory, and could be considered in the correct clinical setting. Electronically Signed   By: Rise Mu M.D.   On: 10/10/2022 02:20   CT ABDOMEN PELVIS W CONTRAST  Result Date: 10/09/2022 CLINICAL DATA:  Right lower quadrant abdominal pain. Altered mental status. EXAM: CT ABDOMEN AND PELVIS WITH CONTRAST TECHNIQUE: Multidetector CT imaging of the abdomen and pelvis was performed using the standard protocol following bolus administration of intravenous contrast. RADIATION DOSE REDUCTION: This exam was performed according to the departmental dose-optimization program which includes automated exposure control, adjustment of the mA and/or kV according to patient size and/or use of iterative reconstruction technique. CONTRAST:  75mL OMNIPAQUE IOHEXOL 350 MG/ML SOLN COMPARISON:  08/29/2022 FINDINGS: Lower chest: Small right pleural effusion with mild right pleural thickening and calcification. Effusion is improved since prior study. Wall thickening in the distal esophagus suggesting reflux disease or esophagitis. No proximal dilatation. Epiphrenic diverticulum of the distal esophagus. Hepatobiliary: No focal liver abnormality is seen. Status post cholecystectomy. No biliary dilatation. Pancreas: Unremarkable. No pancreatic ductal dilatation or surrounding inflammatory changes. Spleen: Normal in size without focal abnormality. Adrenals/Urinary Tract: No adrenal gland nodules. Kidneys are atrophic and scarred. Nephrograms are symmetrical. Bilateral renal cysts, largest on the left measuring 3.9 cm diameter. No change since prior study. No imaging follow-up is indicated. No hydronephrosis or hydroureter. Bladder is decompressed. Suggestion of asymmetrical bladder wall thickening to the left, likely due to under distention. No significant wall thickening was notice on prior study with better distention. Stomach/Bowel: Stomach, small bowel, and colon are not abnormally distended. No wall thickening or  inflammatory changes. Colon is diffusely stool-filled suggesting possible constipation. Clinical correlation is suggested. Appendix is normal. Vascular/Lymphatic: No significant vascular findings are present. No enlarged abdominal or pelvic lymph nodes. Reproductive: Prostate gland is markedly enlarged, measuring 6.2 cm diameter. Other: No free air or free fluid in the abdomen. Abdominal wall musculature appears intact. Fatty atrophy of the right hip musculature. Musculoskeletal: Degenerative changes and scoliosis of the lumbar spine. IMPRESSION: 1. Prominent stool-filled colon suggesting constipation. Clinical correlation suggested. No evidence of bowel obstruction. 2. Esophageal wall thickening likely indicating reflux disease with small epiphrenic diverticulum. 3. Decreasing size of right pleural effusion. Persistent finding of pleural thickening and calcification, likely chronic. 4. Bladder wall thickening is likely due to under distention as this was not present on previous study. 5. Marked enlargement of the prostate gland. Electronically Signed   By: Burman Nieves M.D.   On: 10/09/2022 22:43   CT HEAD WO CONTRAST  Result Date: 10/09/2022 CLINICAL DATA:  Mental status change, unknown cause. EXAM: CT HEAD WITHOUT CONTRAST TECHNIQUE: Contiguous axial images were obtained from the base of the skull through the vertex without intravenous contrast. RADIATION DOSE REDUCTION: This exam was performed according to the departmental dose-optimization program which includes automated exposure control, adjustment of the mA and/or kV according to patient size and/or use of iterative  reconstruction technique. COMPARISON:  08/21/2022. FINDINGS: Brain: No acute intracranial hemorrhage, midline shift or mass effect. No extra-axial fluid collection. Extensive subcortical and periventricular white matter hypodensities are present bilaterally. Diffuse atrophy is noted. Old lacunar infarcts are present in the cerebellar  hemispheres. There is mild hydrocephalus out of proportion to degree of atrophy, unchanged from the previous exam. Vascular: No hyperdense vessel or unexpected calcification. Skull: Normal. Negative for fracture or focal lesion. Sinuses/Orbits: No acute finding. Other: None. IMPRESSION: 1. No acute intracranial process. 2. Atrophy with chronic microvascular ischemic changes. 3. Stable ventriculomegaly, which may be associated with normal pressure hydrocephalus. Electronically Signed   By: Thornell Sartorius M.D.   On: 10/09/2022 20:24   DG Chest Port 1 View  Result Date: 10/09/2022 CLINICAL DATA:  Altered mental status EXAM: PORTABLE CHEST 1 VIEW COMPARISON:  08/29/2022 FINDINGS: Shallow inspiration. Mild blunting of costophrenic angles suggesting small pleural effusions, decreased since prior study. Atelectasis in the lung bases. Heart size and pulmonary vascularity are normal. No pneumothorax. Mediastinal contours appear intact. IMPRESSION: Shallow inspiration. Blunting of costophrenic angles suggesting small effusions, decreased since prior study. Electronically Signed   By: Burman Nieves M.D.   On: 10/09/2022 19:37    Pending Labs Unresulted Labs (From admission, onward)     Start     Ordered   10/11/22 0500  CBC  Tomorrow morning,   R        10/10/22 0620   10/11/22 0500  Basic metabolic panel  Tomorrow morning,   R        10/10/22 0620   10/10/22 0632  T4, free  Add-on,   AD        10/10/22 0631   10/10/22 0500  Blood gas, venous  Tomorrow morning,   R        10/10/22 0442   10/10/22 0247  Urine Culture  Once,   R        10/10/22 0247            Vitals/Pain Today's Vitals   10/10/22 0900 10/10/22 0925 10/10/22 1209 10/10/22 1400  BP: 126/87  125/79 109/66  Pulse: 92  90 83  Resp: 15  15 15   Temp:  (!) 97.2 F (36.2 C) (!) 97.2 F (36.2 C)   TempSrc:  Axillary Oral   SpO2: 99%  99% 100%  Weight:      Height:      PainSc:        Isolation Precautions No active  isolations  Medications Medications  acetaminophen (TYLENOL) tablet 650 mg (has no administration in time range)    Or  acetaminophen (TYLENOL) suppository 650 mg (has no administration in time range)  melatonin tablet 3 mg (has no administration in time range)  ondansetron (ZOFRAN) injection 4 mg (has no administration in time range)  cefTRIAXone (ROCEPHIN) 1 g in sodium chloride 0.9 % 100 mL IVPB (has no administration in time range)  aspirin EC tablet 81 mg (81 mg Oral Given 10/10/22 0944)  atorvastatin (LIPITOR) tablet 20 mg (has no administration in time range)  brimonidine (ALPHAGAN) 0.2 % ophthalmic solution 1 drop (1 drop Both Eyes Given 10/10/22 0945)  levothyroxine (SYNTHROID) tablet 88 mcg (88 mcg Oral Given 10/10/22 0614)  memantine (NAMENDA) tablet 10 mg (10 mg Oral Given 10/10/22 0944)  pantoprazole (PROTONIX) EC tablet 40 mg (40 mg Oral Given 10/10/22 0944)  tamsulosin (FLOMAX) capsule 0.4 mg (0.4 mg Oral Given 10/10/22 0944)  insulin aspart (novoLOG) injection 0-6 Units (1  Units Subcutaneous Given 10/10/22 1258)  0.9 %  sodium chloride infusion ( Intravenous New Bag/Given 10/10/22 1124)  lactated ringers bolus 1,000 mL (0 mLs Intravenous Stopped 10/09/22 2330)  sodium zirconium cyclosilicate (LOKELMA) packet 10 g (10 g Oral Given 10/09/22 2350)  iohexol (OMNIPAQUE) 350 MG/ML injection 75 mL (75 mLs Intravenous Contrast Given 10/09/22 2231)  cefTRIAXone (ROCEPHIN) 1 g in sodium chloride 0.9 % 100 mL IVPB (0 g Intravenous Stopped 10/10/22 0403)    Mobility walks with device     Focused Assessments Neuro Assessment Handoff:  Swallow screen pass? Yes  Cardiac Rhythm: Sinus tachycardia       Neuro Assessment: Exceptions to WDL Neuro Checks:      Has TPA been given? No If patient is a Neuro Trauma and patient is going to OR before floor call report to 4N Charge nurse: [phone] or [phone]   R Recommendations: See Admitting Provider Note  Report given to:    Additional Notes:

## 2022-10-10 NOTE — Evaluation (Signed)
Physical Therapy Evaluation Patient Details Name: Ezell Poke MRN: [id number] DOB: 05/16/1936 Today's Date: 10/10/2022  History of Present Illness  Patient is 87 y.o. male who is admitted to [facility] on 10/09/2022 with acute metabolic encephalopathy after presenting from home to [facility] ED complaining of AMS. Pt was also hospitalized for acute metabolic encephalopathy in February 2024. Head CT/MRI negative for acute process. CT abdomen/pelvis, in comparison to CT abdomen/pelvis from 08/29/2022 showed interval development of bladder wall thickening, consistent with acute cystitis. PMH includes: stroke, memory loss, possible parkinsonism, CKD stage 3, HTN, DM, polio, R tibial fx.   Clinical Impression  Ralf Konopka is 87 y.o. male admitted with above HPI and diagnosis. Patient is currently limited by functional impairments below (see PT problem list). Patient lives with wife and grandson and requires Total Assist for mobility and ADLs at baseline. Patient evaluated by Physical Therapy with no further acute PT needs identified. All education has been completed and the patient has no further questions. See below for any follow-up Physical Therapy or equipment needs. PT is signing off. Thank you for this referral.       Recommendations for follow up therapy are one component of a multi-disciplinary discharge planning process, led by the attending physician.  Recommendations may be updated based on patient status, additional functional criteria and insurance authorization.  Follow Up Recommendations       Assistance Recommended at Discharge Frequent or constant Supervision/Assistance  Patient can return home with the following  Two people to help with walking and/or transfers;Two people to help with bathing/dressing/bathroom;Assistance with cooking/housework;Assistance with feeding;Direct supervision/assist for medications management;Direct supervision/assist for financial management;Assist  for transportation;Help with stairs or ramp for entrance    Equipment Recommendations Other (comment) (mattress air overlay for pressure relief/skin integrity at home due to history of wounds)  Recommendations for Other Services       Functional Status Assessment Patient has had a recent decline in their functional status and demonstrates the ability to make significant improvements in function in a reasonable and predictable amount of time.     Precautions / Restrictions Precautions Precautions: Fall Restrictions Weight Bearing Restrictions: No      Mobility  Bed Mobility Overal bed mobility: Needs Assistance Bed Mobility: Rolling Rolling: Total assist         General bed mobility comments: Rolling to assist skin and reposition in bed    Transfers                   General transfer comment: pt requires Total Assist at baseline    Ambulation/Gait               General Gait Details: pt is non-ambulatory for ~ 2years per wife at baseline  Stairs            Wheelchair Mobility    Modified Rankin (Stroke Patients Only)       Balance                                             Pertinent Vitals/Pain      Home Living Family/patient expects to be discharged to:: Private residence Living Arrangements: Spouse/significant other;Other (Comment) Available Help at Discharge: Family;Available 24 hours/day Type of Home: House Home Access: Stairs to enter Entrance Stairs-Rails: Right;Left;Can reach both Entrance Stairs-Number of Steps: 2   Home Layout: One level Home  Equipment: Hospital bed;Other (comment) Additional Comments: has a hoyer lift but pt's wife is unable to operate    Prior Function Prior Level of Function : Needs assist             Mobility Comments: pts grandson transfer him OOB with Total assist bear hug lift, pt does not get OOB every day. ADLs Comments: Total assist from spouse     Hand Dominance    Dominant Hand: Right    Extremity/Trunk Assessment   Upper Extremity Assessment Upper Extremity Assessment: Generalized weakness    Lower Extremity Assessment Lower Extremity Assessment: Generalized weakness;RLE deficits/detail;LLE deficits/detail RLE Deficits / Details: Rt knee flexion contracture limited to no more than 110 degrees and lackign extension; significant atrophy throughout LLE Deficits / Details: Rt knee flexion contracture limited to no more than 170 degrees and lacking extension; significant atrophy throughout    Cervical / Trunk Assessment Cervical / Trunk Assessment: Kyphotic  Communication   Communication: No difficulties  Cognition Arousal/Alertness: Lethargic Behavior During Therapy: Flat affect Overall Cognitive Status: Difficult to assess                                 General Comments: pt lethargic, spouse present, does not indicate much concern for current funcitonal or cognitive status        General Comments      Exercises     Assessment/Plan    PT Assessment All further PT needs can be met in the next venue of care  PT Problem List Decreased strength;Decreased activity tolerance;Decreased balance;Decreased mobility;Decreased cognition;Decreased skin integrity       PT Treatment Interventions      PT Goals (Current goals can be found in the Care Plan section)  Acute Rehab PT Goals Patient Stated Goal: pt to return home PT Goal Formulation: With family Time For Goal Achievement: 10/24/22 Potential to Achieve Goals: Fair    Frequency       Co-evaluation               AM-PAC PT "6 Clicks" Mobility  Outcome Measure Help needed turning from your back to your side while in a flat bed without using bedrails?: Total Help needed moving from lying on your back to sitting on the side of a flat bed without using bedrails?: Total Help needed moving to and from a bed to a chair (including a wheelchair)?: Total Help needed  standing up from a chair using your arms (e.g., wheelchair or bedside chair)?: Total Help needed to walk in hospital room?: Total Help needed climbing 3-5 steps with a railing? : Total 6 Click Score: 6    End of Session   Activity Tolerance: Patient limited by lethargy Patient left: in bed;with family/visitor present Nurse Communication: Mobility status PT Visit Diagnosis: Difficulty in walking, not elsewhere classified (R26.2);Muscle weakness (generalized) (M62.81);Other abnormalities of gait and mobility (R26.89)    Time: 1006-1020 PT Time Calculation (min) (ACUTE ONLY): 14 min   Charges:   PT Evaluation $PT Eval Low Complexity: 1 Low          Wynn Maudlin, DPT Acute Rehabilitation Services Office [phone]  10/10/22 11:16 AM

## 2022-10-10 NOTE — ED Notes (Signed)
Pt in/out cath performed under sterile technique using 30fr coude catheter bladder drained sample sent to lab for analysis. Pt had stooled himself and was cleaned and provided with new brief and linens

## 2022-10-10 NOTE — ED Notes (Signed)
Pt transported to MRI 

## 2022-10-10 NOTE — ED Notes (Signed)
Straight cath unsuccessful

## 2022-10-10 NOTE — Progress Notes (Signed)
New patient from the ed with mental changes, He is from home. Patient is alert to self and place. Patient has a history or cva earlier this year with left side deficits. Patient moves all extremities. Patient has a wound on his right great toe looks like sore from ingrown toenail. Blackened and blister looking.

## 2022-10-10 NOTE — ED Notes (Signed)
Patient declined wanting to eat breakfast at this time

## 2022-10-10 NOTE — Progress Notes (Addendum)
[facility]                                                                              Mike Mack, is a 87 y.o. male, DOB - Aug 16, 1935, ZOX:[id number] Admit date - 10/09/2022    Outpatient Primary MD for the patient is Georgianne Fick, MD  LOS - 0  days  Chief Complaint  Patient presents with   Altered Mental Status       Brief summary   Patient is a 87 year old male with dementia, BPH, hypertension, DM, hypothyroidism, prior stroke with residual left-sided weakness presented from home with altered mental status.  Patient's family noted that he had stopped talking and had a decline in the mental status around 5:30 PM on the day of admission.  He is usually at baseline alert and oriented.  No seizure-like activity.  Patient had also reported abdominal discomfort. MRI brain showed no acute stroke, age-related cerebral atrophy with moderate chronic microvascular ischemic disease, remote infarcts, ventriculomegaly, ?NPH  UA positive for UTI Patient was admitted for further workup  Assessment & Plan    Principal Problem:   Acute metabolic encephalopathy superimposed on dementia -Likely due to UTI, MRI brain showed no acute stroke, age-related cerebral atrophy with moderate chronic microvascular ischemic disease, remote infarcts,?  NPH -Continue delirium precautions, continue gentle hydration - continue IV Rocephin  Active Problems: Severe Sepsis secondary to UTI, POA -CT abdomen showed constipation, no bowel obstruction, reflux disease, bladder wall thickening likely due to underdistention -Presented with sepsis, tachycardia 106 -137, BP 126/83, mild hypoxia 86 to 87% in the ED, UA positive for UTI, leukocytosis, mild AKI, lactic acidosis -Continue IV fluid hydration, -Follow urine culture, continue IV Rocephin  Mild AKI -Baseline creatinine 1.0-1.2, creatinine 1.28 on 09/05/2022 -Presented with lactic acidosis, creatinine 1.32 with hyperkalemia K  5.8 -Received Lokelma, continue gentle hydration, -Creatinine improving 1.2 today  Hyperkalemia - K 5.8 on admission, received Lokelma -Potassium improved 3.9     Essential hypertension -BP soft, hold amlodipine,     BPH (benign prostatic hyperplasia) -Continue Flomax     DM2 (diabetes mellitus, type 2) -Hemoglobin A1c 6.8, not on any insulin or oral hypoglycemics     Acquired hypothyroidism -TSH 0.261, follow T4 -Currently on Synthroid 88 mcg daily.  If T4 elevated, will decrease Synthroid dose  Generalized debility, dementia -PT OT evaluation when tolerated Estimated body mass index is 24.96 kg/m as calculated from the following:   Height as of this encounter:  (1.651 m).   Weight as of this encounter: 68 kg.  Code Status: DNR DVT Prophylaxis:  SCDs Start: 10/10/22 0414   Level of Care: Level of care: Progressive Family Communication: Disposition Plan:      Remains inpatient appropriate:      Procedures:  None  Consultants:   None  Antimicrobials:   Anti-infectives (From admission, onward)    Start     Dose/Rate Route Frequency Ordered Stop   10/11/22 0330  cefTRIAXone (ROCEPHIN) 1 g in sodium chloride 0.9 % 100 mL IVPB        1 g 200 mL/hr over 30 Minutes Intravenous Every 24 hours  10/10/22 0415     10/10/22 0330  cefTRIAXone (ROCEPHIN) 1 g in sodium chloride 0.9 % 100 mL IVPB        1 g 200 mL/hr over 30 Minutes Intravenous  Once 10/10/22 0319 10/10/22 0403          Medications  aspirin EC  81 mg Oral Daily   atorvastatin  20 mg Oral QPM   brimonidine  1 drop Both Eyes TID   insulin aspart  0-6 Units Subcutaneous TID WC   levothyroxine  88 mcg Oral QAC breakfast   memantine  10 mg Oral BID   pantoprazole  40 mg Oral Daily   tamsulosin  0.4 mg Oral Daily      Subjective:   Mike Mack was seen and examined today.  Alert and awake however still confused oriented to self.  No fevers, no nausea vomiting or abdominal pain.     Objective:   Vitals:   10/10/22 0700 10/10/22 0717 10/10/22 0900 10/10/22 0925  BP: 128/79  126/87   Pulse: 89  92   Resp: 11  15   Temp:    (!) 97.2 F (36.2 C)  TempSrc:    Axillary  SpO2: 99%  99%   Weight:  68 kg    Height:  5\' 5"  (1.651 m)      Intake/Output Summary (Last 24 hours) at 10/10/2022 1108 Last data filed at 10/10/2022 0403 Gross per 24 hour  Intake 1100 ml  Output --  Net 1100 ml     Wt Readings from Last 3 Encounters:  10/10/22 68 kg  09/05/22 68.4 kg  08/22/22 66.7 kg     Exam General: Alert and oriented to self, NAD, still confused Cardiovascular: S1 S2 auscultated,  RRR Respiratory: Clear to auscultation bilaterally Gastrointestinal: Soft, nontender, nondistended, + bowel sounds Ext: no pedal edema bilaterally Neuro: moving all 4 ext Psych: confused    Data Reviewed:  I have personally reviewed following labs    CBC Lab Results  Component Value Date   WBC 9.7 10/10/2022   RBC 3.69 (L) 10/10/2022   HGB 11.2 (L) 10/10/2022   HCT 32.9 (L) 10/10/2022   MCV 89.2 10/10/2022   MCH 30.4 10/10/2022   PLT 248 10/10/2022   MCHC 34.0 10/10/2022   RDW 13.4 10/10/2022   LYMPHSABS 2.0 10/10/2022   MONOABS 0.5 10/10/2022   EOSABS 0.0 10/10/2022   BASOSABS 0.0 10/10/2022     Last metabolic panel Lab Results  Component Value Date   NA 136 10/10/2022   K 3.9 10/10/2022   CL 104 10/10/2022   CO2 24 10/10/2022   BUN 18 10/10/2022   CREATININE 1.23 10/10/2022   GLUCOSE 199 (H) 10/10/2022   GFRNONAA 58 (L) 10/10/2022   GFRAA 52 (L) 03/23/2020   CALCIUM 9.0 10/10/2022   PHOS 2.1 (L) 03/06/2022   PROT 6.8 10/10/2022   ALBUMIN 3.0 (L) 10/10/2022   BILITOT 0.7 10/10/2022   ALKPHOS 121 10/10/2022   AST 15 10/10/2022   ALT 13 10/10/2022   ANIONGAP 8 10/10/2022    CBG (last 3)  Recent Labs    10/10/22 0744  GLUCAP 138*      Coagulation Profile: Recent Labs  Lab 10/10/22 0510  INR 1.3*     Radiology Studies: I have  personally reviewed the imaging studies  MR BRAIN WO CONTRAST  Result Date: 10/10/2022 CLINICAL DATA:  Initial evaluation for mental status change, unknown cause. EXAM: MRI HEAD WITHOUT CONTRAST TECHNIQUE: Multiplanar, multiecho pulse  sequences of the brain and surrounding structures were obtained without intravenous contrast. COMPARISON:  CT from 10/09/2022. FINDINGS: Brain: Examination degraded by motion artifact. Generalized age-related cerebral atrophy. Patchy T2/FLAIR hyperintensity involving the periventricular and deep white matter both cerebral hemispheres, most characteristic of chronic microvascular ischemic disease, moderately advanced in nature. Remote infarcts involving the left thalamus and bilateral cerebellar hemispheres noted. No evidence for acute or subacute infarct. Gray-white matter differentiation maintained. No acute or chronic intracranial blood products. No mass lesion, mass effect or midline shift. Diffuse ventriculomegaly somewhat out of proportion to cortical sulcation, similar to prior. No extra-axial fluid collection. Pituitary gland and suprasellar region grossly within normal limits. Vascular: Major intracranial vascular flow voids are maintained. Skull and upper cervical spine: Degenerative osteoarthritic changes noted about the skull base. Craniocervical junction otherwise unremarkable. Bone marrow signal intensity within normal limits. No scalp soft tissue abnormality. Sinuses/Orbits: Prior bilateral ocular lens replacement. Paranasal sinuses are largely clear. Small to moderate right with small left mastoid effusions. Visualized nasopharynx unremarkable. Other: None. IMPRESSION: 1. No acute intracranial abnormality. 2. Age-related cerebral atrophy with moderate chronic microvascular ischemic disease. 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 3. Ventriculomegaly somewhat out of proportion to cortical sulcation. While this finding could be related to underlying  atrophy, a degree of NPH could be contributory, and could be considered in the correct clinical setting. Electronically Signed   By: Rise Mu M.D.   On: 10/10/2022 02:20   CT ABDOMEN PELVIS W CONTRAST  Result Date: 10/09/2022 CLINICAL DATA:  Right lower quadrant abdominal pain. Altered mental status. EXAM: CT ABDOMEN AND PELVIS WITH CONTRAST TECHNIQUE: Multidetector CT imaging of the abdomen and pelvis was performed using the standard protocol following bolus administration of intravenous contrast. RADIATION DOSE REDUCTION: This exam was performed according to the departmental dose-optimization program which includes automated exposure control, adjustment of the mA and/or kV according to patient size and/or use of iterative reconstruction technique. CONTRAST:  75mL OMNIPAQUE IOHEXOL 350 MG/ML SOLN COMPARISON:  08/29/2022 FINDINGS: Lower chest: Small right pleural effusion with mild right pleural thickening and calcification. Effusion is improved since prior study. Wall thickening in the distal esophagus suggesting reflux disease or esophagitis. No proximal dilatation. Epiphrenic diverticulum of the distal esophagus. Hepatobiliary: No focal liver abnormality is seen. Status post cholecystectomy. No biliary dilatation. Pancreas: Unremarkable. No pancreatic ductal dilatation or surrounding inflammatory changes. Spleen: Normal in size without focal abnormality. Adrenals/Urinary Tract: No adrenal gland nodules. Kidneys are atrophic and scarred. Nephrograms are symmetrical. Bilateral renal cysts, largest on the left measuring 3.9 cm diameter. No change since prior study. No imaging follow-up is indicated. No hydronephrosis or hydroureter. Bladder is decompressed. Suggestion of asymmetrical bladder wall thickening to the left, likely due to under distention. No significant wall thickening was notice on prior study with better distention. Stomach/Bowel: Stomach, small bowel, and colon are not abnormally  distended. No wall thickening or inflammatory changes. Colon is diffusely stool-filled suggesting possible constipation. Clinical correlation is suggested. Appendix is normal. Vascular/Lymphatic: No significant vascular findings are present. No enlarged abdominal or pelvic lymph nodes. Reproductive: Prostate gland is markedly enlarged, measuring 6.2 cm diameter. Other: No free air or free fluid in the abdomen. Abdominal wall musculature appears intact. Fatty atrophy of the right hip musculature. Musculoskeletal: Degenerative changes and scoliosis of the lumbar spine. IMPRESSION: 1. Prominent stool-filled colon suggesting constipation. Clinical correlation suggested. No evidence of bowel obstruction. 2. Esophageal wall thickening likely indicating reflux disease with small epiphrenic diverticulum. 3. Decreasing size of right  pleural effusion. Persistent finding of pleural thickening and calcification, likely chronic. 4. Bladder wall thickening is likely due to under distention as this was not present on previous study. 5. Marked enlargement of the prostate gland. Electronically Signed   By: Burman Nieves M.D.   On: 10/09/2022 22:43   CT HEAD WO CONTRAST  Result Date: 10/09/2022 CLINICAL DATA:  Mental status change, unknown cause. EXAM: CT HEAD WITHOUT CONTRAST TECHNIQUE: Contiguous axial images were obtained from the base of the skull through the vertex without intravenous contrast. RADIATION DOSE REDUCTION: This exam was performed according to the departmental dose-optimization program which includes automated exposure control, adjustment of the mA and/or kV according to patient size and/or use of iterative reconstruction technique. COMPARISON:  08/21/2022. FINDINGS: Brain: No acute intracranial hemorrhage, midline shift or mass effect. No extra-axial fluid collection. Extensive subcortical and periventricular white matter hypodensities are present bilaterally. Diffuse atrophy is noted. Old lacunar infarcts  are present in the cerebellar hemispheres. There is mild hydrocephalus out of proportion to degree of atrophy, unchanged from the previous exam. Vascular: No hyperdense vessel or unexpected calcification. Skull: Normal. Negative for fracture or focal lesion. Sinuses/Orbits: No acute finding. Other: None. IMPRESSION: 1. No acute intracranial process. 2. Atrophy with chronic microvascular ischemic changes. 3. Stable ventriculomegaly, which may be associated with normal pressure hydrocephalus. Electronically Signed   By: Thornell Sartorius M.D.   On: 10/09/2022 20:24   DG Chest Port 1 View  Result Date: 10/09/2022 CLINICAL DATA:  Altered mental status EXAM: PORTABLE CHEST 1 VIEW COMPARISON:  08/29/2022 FINDINGS: Shallow inspiration. Mild blunting of costophrenic angles suggesting small pleural effusions, decreased since prior study. Atelectasis in the lung bases. Heart size and pulmonary vascularity are normal. No pneumothorax. Mediastinal contours appear intact. IMPRESSION: Shallow inspiration. Blunting of costophrenic angles suggesting small effusions, decreased since prior study. Electronically Signed   By: Burman Nieves M.D.   On: 10/09/2022 19:37       Marjean Imperato M.D. [facility] 10/10/2022, 11:08 AM  Available via Epic secure chat 7am-7pm After 7 pm, please refer to night coverage provider listed on amion.

## 2022-10-10 NOTE — Progress Notes (Signed)
Dr. Isidoro Donning notified via message that patient had a critical level of po2 less than 31. Patient is 100% on room air. Patient is sleepy but arouses easily.

## 2022-10-11 DIAGNOSIS — G9341 Metabolic encephalopathy: Secondary | ICD-10-CM | POA: Diagnosis not present

## 2022-10-11 LAB — BASIC METABOLIC PANEL
Anion gap: 11 (ref 5–15)
BUN: 15 mg/dL (ref 8–23)
CO2: 20 mmol/L — ABNORMAL LOW (ref 22–32)
Calcium: 8.6 mg/dL — ABNORMAL LOW (ref 8.9–10.3)
Chloride: 106 mmol/L (ref 98–111)
Creatinine, Ser: 1.05 mg/dL (ref 0.61–1.24)
GFR, Estimated: >60 mL/min (ref >60)
Glucose, Bld: 234 mg/dL — ABNORMAL HIGH (ref 70–99)
Potassium: 3.8 mmol/L (ref 3.5–5.1)
Sodium: 137 mmol/L (ref 135–145)

## 2022-10-11 LAB — CBC
HCT: 27.6 % — ABNORMAL LOW (ref 39.0–52.0)
Hemoglobin: 9.5 g/dL — ABNORMAL LOW (ref 13.0–17.0)
MCH: 31.1 pg (ref 26.0–34.0)
MCHC: 34.4 g/dL (ref 30.0–36.0)
MCV: 90.5 fL (ref 80.0–100.0)
Platelets: 192 10*3/uL (ref 150–400)
RBC: 3.05 MIL/uL — ABNORMAL LOW (ref 4.22–5.81)
RDW: 13.6 % (ref 11.5–15.5)
WBC: 5.9 10*3/uL (ref 4.0–10.5)
nRBC: 0 % (ref 0.0–0.2)

## 2022-10-11 LAB — CULTURE, BLOOD (ROUTINE X 2): Culture: NO GROWTH

## 2022-10-11 LAB — GLUCOSE, CAPILLARY
Glucose-Capillary: 134 mg/dL — ABNORMAL HIGH (ref 70–99)
Glucose-Capillary: 224 mg/dL — ABNORMAL HIGH (ref 70–99)
Glucose-Capillary: 166 mg/dL — ABNORMAL HIGH (ref 70–99)
Glucose-Capillary: 186 mg/dL — ABNORMAL HIGH (ref 70–99)

## 2022-10-11 LAB — URINE CULTURE: Culture: NO GROWTH

## 2022-10-11 MED ORDER — HEPARIN SODIUM (PORCINE) 5000 UNIT/ML IJ SOLN
5000.0000 [IU] | Freq: Three times a day (TID) | INTRAMUSCULAR | Status: DC
  Administered 2022-10-11 – 2022-10-13 (×6): 5000 [IU] via SUBCUTANEOUS
  Filled 2022-10-11 (×6): qty 1

## 2022-10-11 MED ORDER — DOCUSATE SODIUM 100 MG PO CAPS
200.0000 mg | ORAL_CAPSULE | Freq: Two times a day (BID) | ORAL | Status: DC
  Administered 2022-10-11 – 2022-10-12 (×4): 200 mg via ORAL
  Filled 2022-10-11 (×5): qty 2

## 2022-10-11 MED ORDER — LEVOTHYROXINE SODIUM 50 MCG PO TABS: 50.0000 ug | ORAL_TABLET | Freq: Every day | ORAL | Status: DC

## 2022-10-11 MED ORDER — BISACODYL 10 MG RE SUPP
10.0000 mg | Freq: Once | RECTAL | Status: AC
  Administered 2022-10-11: 10 mg via RECTAL
  Filled 2022-10-11: qty 1

## 2022-10-11 MED ORDER — ORAL CARE MOUTH RINSE: 15.0000 mL | OROMUCOSAL | Status: DC | PRN

## 2022-10-11 MED ORDER — POLYETHYLENE GLYCOL 3350 17 G PO PACK
17.0000 g | PACK | Freq: Two times a day (BID) | ORAL | Status: DC
  Administered 2022-10-11 (×2): 17 g via ORAL
  Filled 2022-10-11 (×5): qty 1

## 2022-10-11 NOTE — TOC Progression Note (Signed)
Transition of Care [facility]) - Progression Note    Patient Details  Name: Mike Mack MRN: [id number] Date of Birth: [birth year]  Transition of Care [facility]) CM/SW Contact  Gordy Clement, RN Phone Number: 10/11/2022, 1:45 PM  Clinical Narrative:    CM met with Wife bedside. Patient appeared to be sleeping. CM discussed dc plan with Patient's Wife. Patient lives wiuth his Wife and Lucila Maine, Duwayne Heck who are caretakers for Patient.  [facility] will continue to provide home  Hospice care. Wife states they have all DME needed.  Patient will transport home with [facility] when it is time to DC.     Expected Discharge Plan: Home w Hospice Care ([facility] from Home with [facility] services) Barriers to Discharge: Continued Medical Work up  Expected Discharge Plan and Services   Discharge Planning Services: CM Consult   Living arrangements for the past 2 months: Single Family Home                 DME Arranged:  (TBD) DME Agency: [employer]     Representative spoke with at DME Agency: Trena Platt   Liaison made aware Patient is here [facility] Arranged:  (TBD  May return home with Hospice  Recs TBD) HH Agency:  ([facility]) Date [facility] Agency Contacted: 10/10/22 Time HH Agency Contacted: [phone] Representative spoke with at [facility] Agency: Higher education careers adviser [employer]   Social Determinants of Health (SDOH) Interventions SDOH Screenings   Food Insecurity: No Food Insecurity (08/21/2022)  Housing: Low Risk  (08/21/2022)  Transportation Needs: No Transportation Needs (08/21/2022)  Utilities: Not At Risk (08/21/2022)  Tobacco Use: Low Risk  (10/10/2022)    Readmission Risk Interventions    09/04/2022   10:29 AM 08/24/2022   10:47 AM  Readmission Risk Prevention Plan  Transportation Screening Complete Complete  PCP or Specialist Appt within 3-5 Days  Complete  HRI or Home Care Consult  Complete  Social Work Consult for Recovery Care Planning/Counseling  Complete  Palliative Care Screening  Not Applicable  Medication  Review Oceanographer) Complete Referral to Pharmacy  PCP or Specialist appointment within 3-5 days of discharge Complete   HRI or Home Care Consult Complete   SW Recovery Care/Counseling Consult Complete   Palliative Care Screening Not Applicable   Skilled Nursing Facility Not Applicable

## 2022-10-11 NOTE — Evaluation (Signed)
Clinical/Bedside Swallow Evaluation Patient Details  Name: Mike Mack MRN: [id number] Date of Birth: May 26, 1936  Today's Date: 10/11/2022 Time: SLP Start Time (ACUTE ONLY): 1025 SLP Stop Time (ACUTE ONLY): 1040 SLP Time Calculation (min) (ACUTE ONLY): 15 min  Past Medical History:  Past Medical History:  Diagnosis Date   Acute metabolic encephalopathy 08/09/2022   Anemia 05/13/2013   Arthritis    "right leg" (07/25/2013)   BPH (benign prostatic hyperplasia)    Cholecystitis, acute 05/24/2013   Constipation 05/13/2013   Exertional shortness of breath    "sometimes" (07/25/2013)   History of kidney stones    History of stomach ulcers    Hypertension    Hypothermia 04/01/2022   Neuropathy    Polio    Polio Childhood   Small bowel obstruction    Stroke 2014   residual:  "left hand kind of numb" (07/25/2013)   Type II diabetes mellitus    Past Surgical History:  Past Surgical History:  Procedure Laterality Date   BIOPSY  11/30/2020   Procedure: BIOPSY;  Surgeon: Kerin Salen, MD;  Location: [facility] ENDOSCOPY;  Service: Gastroenterology;;   BIOPSY  03/03/2022   Procedure: BIOPSY;  Surgeon: Vida Rigger, MD;  Location: [facility] ENDOSCOPY;  Service: Gastroenterology;;   BOWEL RESECTION     CATARACT EXTRACTION W/ INTRAOCULAR LENS  IMPLANT, BILATERAL Bilateral 2000's   CHOLECYSTECTOMY N/A 07/25/2013   Procedure: LAPAROSCOPIC CHOLECYSTECTOMY WITH INTRAOPERATIVE CHOLANGIOGRAM;  Surgeon: Ernestene Mention, MD;  Location: [facility] OR;  Service: General;  Laterality: N/A;   COLON SURGERY     COLONOSCOPY     Hx: of   CYSTOSCOPY WITH BIOPSY N/A 12/01/2019   Procedure: CYSTOSCOPY WITH BLADDER BIOPSY AND FULGURATION;  Surgeon: Crista Elliot, MD;  Location: [facility] ORS;  Service: Urology;  Laterality: N/A;   ESOPHAGOGASTRODUODENOSCOPY N/A 11/30/2020   Procedure: ESOPHAGOGASTRODUODENOSCOPY (EGD);  Surgeon: Kerin Salen, MD;  Location: [facility] ENDOSCOPY;  Service: Gastroenterology;  Laterality: N/A;    ESOPHAGOGASTRODUODENOSCOPY N/A 03/03/2022   Procedure: ESOPHAGOGASTRODUODENOSCOPY (EGD);  Surgeon: Vida Rigger, MD;  Location: [facility] ENDOSCOPY;  Service: Gastroenterology;  Laterality: N/A;   EXCISIONAL HEMORRHOIDECTOMY  2000's   EXTRACORPOREAL SHOCK WAVE LITHOTRIPSY Right 10/03/2018   Procedure: EXTRACORPOREAL SHOCK WAVE LITHOTRIPSY (ESWL);  Surgeon: Jerilee Field, MD;  Location: [facility] ORS;  Service: Urology;  Laterality: Right;   EYE SURGERY     INGUINAL HERNIA REPAIR Right 1970's   IR RADIOLOGIST EVAL & MGMT  04/06/2020   LAPAROSCOPIC CHOLECYSTECTOMY  07/25/2013   w/LOA (07/25/2013)   HPI:  Patient is an 87 y.o. male with PMH: DM-2, BPH, CVA, memory loss, possible Parkinsonism, CKD stage 3, HTN, polio. He presented to the hospital on 10/10/22 with c/o AMS. Of note, he was hospitalized in February of 2024 with acute metabolic encephalopathy. When he arrived to ED for current admission, CT head and MRI brain both negative for acute process. CT abdomen/pelvis, in comparison to CT abdomen/pelvis from 08/29/2022 showed interval development of bladder wall thickening, consistent with acute cystitis. CXR showing shallow inspiration.    Assessment / Plan / Recommendation  Clinical Impression  Patient does not currently present with clinical s/s of dysphagia as per this bedside swallow evaluation. Patient's spouse denies any difficulties here or at home with PO intake and swallowing. NT showed SLP that patient ate almost 100% of breakfast tray. Patient's eyes were closed but this is baseline and spouse said, "he's not asleep". Patient participated in a little PO intake of thin liquids via straw sips, regular solids. No  observed oral phase difficulties, swallow initiation appeared timely and no overt s/s aspiration or penetration. SLP recommending continue with current diet and no f/u needed at this time. SLP Visit Diagnosis: Dysphagia, unspecified (R13.10)    Aspiration Risk  No limitations;Mild aspiration risk     Diet Recommendation Regular;Thin liquid   Liquid Administration via: Cup;Straw Medication Administration: Other (Comment) (as tolerated) Supervision: Patient able to self feed Compensations: Slow rate;Small sips/bites Postural Changes: Seated upright at 90 degrees    Other  Recommendations Oral Care Recommendations: Oral care BID    Recommendations for follow up therapy are one component of a multi-disciplinary discharge planning process, led by the attending physician.  Recommendations may be updated based on patient status, additional functional criteria and insurance authorization.  Follow up Recommendations No SLP follow up      Assistance Recommended at Discharge    Functional Status Assessment Patient has not had a recent decline in their functional status  Frequency and Duration   N/A         Prognosis   N/A     Swallow Study   General Date of Onset: 10/11/22 HPI: Patient is an 87 y.o. male with PMH: DM-2, BPH, CVA, memory loss, possible Parkinsonism, CKD stage 3, HTN, polio. He presented to the hospital on 10/10/22 with c/o AMS. Of note, he was hospitalized in February of 2024 with acute metabolic encephalopathy. When he arrived to ED for current admission, CT head and MRI brain both negative for acute process. CT abdomen/pelvis, in comparison to CT abdomen/pelvis from 08/29/2022 showed interval development of bladder wall thickening, consistent with acute cystitis. CXR showing shallow inspiration. Type of Study: Bedside Swallow Evaluation Previous Swallow Assessment: during admission in 2023 Diet Prior to this Study: Regular;Thin liquids (Level 0) Temperature Spikes Noted: No Respiratory Status: Room air History of Recent Intubation: No Behavior/Cognition: Alert;Cooperative Oral Cavity Assessment: Within Functional Limits Oral Care Completed by SLP: No Oral Cavity - Dentition: Adequate natural dentition Vision: Functional for self-feeding Self-Feeding Abilities:  Total assist Patient Positioning: Upright in bed Baseline Vocal Quality: Normal Volitional Cough: Strong Volitional Swallow: Able to elicit    Oral/Motor/Sensory Function Overall Oral Motor/Sensory Function: Mild impairment Facial ROM: Reduced left Facial Strength: Reduced left   Ice Chips Ice chips: Not tested   Thin Liquid Thin Liquid: Within functional limits Presentation: Straw    Nectar Thick     Honey Thick     Puree Puree: Not tested   Solid     Solid: Within functional limits      Angela Nevin, MA, CCC-SLP Speech Therapy

## 2022-10-11 NOTE — Progress Notes (Signed)
PROGRESS NOTE                                                                                                                                                                                                             Patient Demographics:    Mike Mack, is a 87 y.o. male, DOB - 20-Dec-1935, UJW:[id number]  Outpatient Primary MD for the patient is Mike Fick, MD    LOS - 1  Admit date - 10/09/2022    Chief Complaint  Patient presents with   Altered Mental Status       Brief Narrative (HPI from H&P)     87 y.o. male with medical history significant for BPH, essential pretension, type 2 diabetes mellitus, acquired hypothyroidism, who is admitted to [facility] on 10/09/2022 with acute metabolic encephalopathy after presenting from home to [facility] ED complaining of altered mental status.  Other workup in the ER was suggestive of UTI along with dehydration and he was admitted to the hospital.   Subjective:    Mike Mack today has, No headache, No chest pain, No abdominal pain - No Nausea, No new weakness tingling or numbness, no SOB   Assessment  & Plan :    Acute metabolic encephalopathy.  Caused by UTI, much improved, no headache or focal deficits which are new, MRI brain stable except for possible underlying mild NPH, continue antibiotics and gentle hydration with IV fluids.  Follow urine cultures.  Advance activity.  PT OT and speech.  At baseline bedbound.  Severe sepsis upon admission.  Resolved.  AKI due to dehydration.  Resolved.  Essential hypertension.  Blood pressure stable resume home dose Norvasc.  BPH.  On Flomax.  Will monitor it bladder scans in the setting of UTI to make sure proper emptying.  Hypothyroidism.  Currently has developed iatrogenic hyperthyroidism, free T4 hide TSH suppressed, hold home dose Synthroid for 1 week then resume at a lower dose, PCP to monitor.  History of  polio, right lower extremity weakness, right lower extremity chronic fracture.  Bedbound status.  Supportive care.    DM type II.  On sliding scale.  CBG (last 3)  Recent Labs    10/10/22 1829 10/10/22 2151 10/11/22 0907  GLUCAP 167* 179* 134*         Condition - Extremely Guarded  Family Communication  :  Wife [name] in detail over the phone on 10/11/2022  Code Status :  DNR  Consults  :  None  PUD Prophylaxis : PPI   Procedures  :     CT - 1. Prominent stool-filled colon suggesting constipation. Clinical correlation suggested. No evidence of bowel obstruction. 2. Esophageal wall thickening likely indicating reflux disease with small epiphrenic diverticulum. 3. Decreasing size of right pleural effusion. Persistent finding of pleural thickening and calcification, likely chronic. 4. Bladder wall thickening is likely due to under distention as this was not present on previous study. 5. Marked enlargement of the prostate gland.  MRI -  1. No acute intracranial abnormality. 2. Age-related cerebral atrophy with moderate chronic microvascular ischemic disease. 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 3. Ventriculomegaly somewhat out of proportion to cortical sulcation. While this finding could be related to underlying atrophy, a degree of NPH could be contributory, and could be considered in the correct clinical setting.      Disposition Plan  :    Status is: Inpatient   DVT Prophylaxis  :    SCDs Start: 10/10/22 0414    Lab Results  Component Value Date   PLT 248 10/10/2022    Diet :  Diet Order             Diet regular Room service appropriate? Yes; Fluid consistency: Thin  Diet effective now                    Inpatient Medications  Scheduled Meds:  aspirin EC  81 mg Oral Daily   atorvastatin  20 mg Oral QPM   brimonidine  1 drop Both Eyes TID   insulin aspart  0-6 Units Subcutaneous TID WC   [START ON 10/16/2022] levothyroxine  50  mcg Oral Q0600   memantine  10 mg Oral BID   pantoprazole  40 mg Oral Daily   tamsulosin  0.4 mg Oral Daily   Continuous Infusions:  sodium chloride 50 mL/hr at 10/10/22 1124   cefTRIAXone (ROCEPHIN)  IV 1 g (10/11/22 0236)   PRN Meds:.acetaminophen **OR** acetaminophen, melatonin, ondansetron (ZOFRAN) IV  Antibiotics  :    Anti-infectives (From admission, onward)    Start     Dose/Rate Route Frequency Ordered Stop   10/11/22 0330  cefTRIAXone (ROCEPHIN) 1 g in sodium chloride 0.9 % 100 mL IVPB        1 g 200 mL/hr over 30 Minutes Intravenous Every 24 hours 10/10/22 0415     10/10/22 0330  cefTRIAXone (ROCEPHIN) 1 g in sodium chloride 0.9 % 100 mL IVPB        1 g 200 mL/hr over 30 Minutes Intravenous  Once 10/10/22 0319 10/10/22 0403         Objective:   Vitals:   10/10/22 2000 10/10/22 2307 10/11/22 0317 10/11/22 0753  BP: (!) 141/88 (!) 155/82 137/84 137/79  Pulse: 99 95 82 88  Resp: 11 16 15 14   Temp:  97.7 F (36.5 C) 97.8 F (36.6 C) (!) 97.4 F (36.3 C)  TempSrc:  Oral Oral Oral  SpO2: 96% 99% 98% 98%  Weight:      Height:        Wt Readings from Last 3 Encounters:  10/10/22 68 kg  09/05/22 68.4 kg  08/22/22 66.7 kg     Intake/Output Summary (Last 24 hours) at 10/11/2022 0935 Last data filed at 10/11/2022 0703 Gross per 24 hour  Intake 1002.33 ml  Output 450  ml  Net 552.33 ml     Physical Exam  Awake Alert, No new F.N deficits, chronic right lower extremity weakness from previous polio, bedbound at baseline, [place].AT,PERRAL Supple Neck, No JVD,   Symmetrical Chest wall movement, Good air movement bilaterally, CTAB RRR,No Gallops,Rubs or new Murmurs,  +ve B.Sounds, Abd Soft, No tenderness,   No Cyanosis, Clubbing or edema         Data Review:    Recent Labs  Lab 10/09/22 1951 10/09/22 2017 10/10/22 0421  WBC 12.0*  --  9.7  HGB 13.2 14.3 11.2*  HCT 39.4 42.0 32.9*  PLT 258  --  248  MCV 91.4  --  89.2  MCH 30.6  --  30.4  MCHC 33.5   --  34.0  RDW 13.4  --  13.4  LYMPHSABS 1.3  --  2.0  MONOABS 0.4  --  0.5  EOSABS 0.0  --  0.0  BASOSABS 0.0  --  0.0    Recent Labs  Lab 10/09/22 1951 10/09/22 2017 10/09/22 2350 10/10/22 0421 10/10/22 0433 10/10/22 0438 10/10/22 0510  NA 136 138  --  136  --   --   --   K 5.4* 5.8* 4.9 3.9  --   --   --   CL 101  --   --  104  --   --   --   CO2 23  --   --  24  --   --   --   ANIONGAP 12  --   --  8  --   --   --   GLUCOSE 251*  --   --  199*  --   --   --   BUN 20  --   --  18  --   --   --   CREATININE 1.32*  --   --  1.23  --   --   --   AST 27  --   --  15  --   --   --   ALT 17  --   --  13  --   --   --   ALKPHOS 142*  --   --  121  --   --   --   BILITOT 1.5*  --   --  0.7  --   --   --   ALBUMIN 3.6  --   --  3.0*  --   --   --   PROCALCITON  --   --   --  0.15  --   --   --   LATICACIDVEN 2.0*  --   --   --   --  1.4  --   INR  --   --   --   --   --   --  1.3*  TSH  --   --   --   --  0.264*  --   --   HGBA1C  --   --   --   --  6.8*  --   --   AMMONIA 36*  --   --   --   --   --   --   MG  --   --   --  1.7  --   --   --   CALCIUM 9.8  --   --  9.0  --   --   --       Recent  Labs  Lab 10/09/22 1951 10/10/22 0421 10/10/22 0433 10/10/22 0438 10/10/22 0510  PROCALCITON  --  0.15  --   --   --   LATICACIDVEN 2.0*  --   --  1.4  --   INR  --   --   --   --  1.3*  TSH  --   --  0.264*  --   --   HGBA1C  --   --  6.8*  --   --   AMMONIA 36*  --   --   --   --   MG  --  1.7  --   --   --   CALCIUM 9.8 9.0  --   --   --     Recent Labs  Lab 10/09/22 1951 10/10/22 0421 10/10/22 0438  WBC 12.0* 9.7  --   PLT 258 248  --   PROCALCITON  --  0.15  --   LATICACIDVEN 2.0*  --  1.4  CREATININE 1.32* 1.23  --     ------------------------------------------------------------------------------------------------------------------ Lab Results  Component Value Date   CHOL 94 04/01/2022   HDL 44 04/01/2022   LDLCALC 38 04/01/2022   TRIG 59 04/01/2022    CHOLHDL 2.1 04/01/2022    Lab Results  Component Value Date   HGBA1C 6.8 (H) 10/10/2022    Recent Labs    10/10/22 0433  TSH 0.264*     Radiology Reports MR BRAIN WO CONTRAST  Result Date: 10/10/2022 CLINICAL DATA:  Initial evaluation for mental status change, unknown cause. EXAM: MRI HEAD WITHOUT CONTRAST TECHNIQUE: Multiplanar, multiecho pulse sequences of the brain and surrounding structures were obtained without intravenous contrast. COMPARISON:  CT from 10/09/2022. FINDINGS: Brain: Examination degraded by motion artifact. Generalized age-related cerebral atrophy. Patchy T2/FLAIR hyperintensity involving the periventricular and deep white matter both cerebral hemispheres, most characteristic of chronic microvascular ischemic disease, moderately advanced in nature. Remote infarcts involving the left thalamus and bilateral cerebellar hemispheres noted. No evidence for acute or subacute infarct. Gray-white matter differentiation maintained. No acute or chronic intracranial blood products. No mass lesion, mass effect or midline shift. Diffuse ventriculomegaly somewhat out of proportion to cortical sulcation, similar to prior. No extra-axial fluid collection. Pituitary gland and suprasellar region grossly within normal limits. Vascular: Major intracranial vascular flow voids are maintained. Skull and upper cervical spine: Degenerative osteoarthritic changes noted about the skull base. Craniocervical junction otherwise unremarkable. Bone marrow signal intensity within normal limits. No scalp soft tissue abnormality. Sinuses/Orbits: Prior bilateral ocular lens replacement. Paranasal sinuses are largely clear. Small to moderate right with small left mastoid effusions. Visualized nasopharynx unremarkable. Other: None. IMPRESSION: 1. No acute intracranial abnormality. 2. Age-related cerebral atrophy with moderate chronic microvascular ischemic disease. Remote infarcts involving the left thalamus and  cerebellum. 3. Ventriculomegaly somewhat out of proportion to cortical sulcation. While this finding could be related to underlying atrophy, a degree of NPH could be contributory, and could be considered in the correct clinical setting. Electronically Signed   By: Rise Mu M.D.   On: 10/10/2022 02:20   CT ABDOMEN PELVIS W CONTRAST  Result Date: 10/09/2022 CLINICAL DATA:  Right lower quadrant abdominal pain. Altered mental status. EXAM: CT ABDOMEN AND PELVIS WITH CONTRAST TECHNIQUE: Multidetector CT imaging of the abdomen and pelvis was performed using the standard protocol following bolus administration of intravenous contrast. RADIATION DOSE REDUCTION: This exam was performed according to the departmental dose-optimization program which includes automated exposure control, adjustment of the mA and/or kV  according to patient size and/or use of iterative reconstruction technique. CONTRAST:  75mL OMNIPAQUE IOHEXOL 350 MG/ML SOLN COMPARISON:  08/29/2022 FINDINGS: Lower chest: Small right pleural effusion with mild right pleural thickening and calcification. Effusion is improved since prior study. Wall thickening in the distal esophagus suggesting reflux disease or esophagitis. No proximal dilatation. Epiphrenic diverticulum of the distal esophagus. Hepatobiliary: No focal liver abnormality is seen. Status post cholecystectomy. No biliary dilatation. Pancreas: Unremarkable. No pancreatic ductal dilatation or surrounding inflammatory changes. Spleen: Normal in size without focal abnormality. Adrenals/Urinary Tract: No adrenal gland nodules. Kidneys are atrophic and scarred. Nephrograms are symmetrical. Bilateral renal cysts, largest on the left measuring 3.9 cm diameter. No change since prior study. No imaging follow-up is indicated. No hydronephrosis or hydroureter. Bladder is decompressed. Suggestion of asymmetrical bladder wall thickening to the left, likely due to under distention. No significant wall  thickening was notice on prior study with better distention. Stomach/Bowel: Stomach, small bowel, and colon are not abnormally distended. No wall thickening or inflammatory changes. Colon is diffusely stool-filled suggesting possible constipation. Clinical correlation is suggested. Appendix is normal. Vascular/Lymphatic: No significant vascular findings are present. No enlarged abdominal or pelvic lymph nodes. Reproductive: Prostate gland is markedly enlarged, measuring 6.2 cm diameter. Other: No free air or free fluid in the abdomen. Abdominal wall musculature appears intact. Fatty atrophy of the right hip musculature. Musculoskeletal: Degenerative changes and scoliosis of the lumbar spine. IMPRESSION: 1. Prominent stool-filled colon suggesting constipation. Clinical correlation suggested. No evidence of bowel obstruction. 2. Esophageal wall thickening likely indicating reflux disease with small epiphrenic diverticulum. 3. Decreasing size of right pleural effusion. Persistent finding of pleural thickening and calcification, likely chronic. 4. Bladder wall thickening is likely due to under distention as this was not present on previous study. 5. Marked enlargement of the prostate gland. Electronically Signed   By: Burman Nieves M.D.   On: 10/09/2022 22:43   CT HEAD WO CONTRAST  Result Date: 10/09/2022 CLINICAL DATA:  Mental status change, unknown cause. EXAM: CT HEAD WITHOUT CONTRAST TECHNIQUE: Contiguous axial images were obtained from the base of the skull through the vertex without intravenous contrast. RADIATION DOSE REDUCTION: This exam was performed according to the departmental dose-optimization program which includes automated exposure control, adjustment of the mA and/or kV according to patient size and/or use of iterative reconstruction technique. COMPARISON:  08/21/2022. FINDINGS: Brain: No acute intracranial hemorrhage, midline shift or mass effect. No extra-axial fluid collection. Extensive  subcortical and periventricular white matter hypodensities are present bilaterally. Diffuse atrophy is noted. Old lacunar infarcts are present in the cerebellar hemispheres. There is mild hydrocephalus out of proportion to degree of atrophy, unchanged from the previous exam. Vascular: No hyperdense vessel or unexpected calcification. Skull: Normal. Negative for fracture or focal lesion. Sinuses/Orbits: No acute finding. Other: None. IMPRESSION: 1. No acute intracranial process. 2. Atrophy with chronic microvascular ischemic changes. 3. Stable ventriculomegaly, which may be associated with normal pressure hydrocephalus. Electronically Signed   By: Thornell Sartorius M.D.   On: 10/09/2022 20:24   DG Chest Port 1 View  Result Date: 10/09/2022 CLINICAL DATA:  Altered mental status EXAM: PORTABLE CHEST 1 VIEW COMPARISON:  08/29/2022 FINDINGS: Shallow inspiration. Mild blunting of costophrenic angles suggesting small pleural effusions, decreased since prior study. Atelectasis in the lung bases. Heart size and pulmonary vascularity are normal. No pneumothorax. Mediastinal contours appear intact. IMPRESSION: Shallow inspiration. Blunting of costophrenic angles suggesting small effusions, decreased since prior study. Electronically Signed   By: Burman Nieves  M.D.   On: 10/09/2022 19:37      Signature  -   Susa Raring M.D on 10/11/2022 at 9:35 AM   -  To page go to [email or web address]

## 2022-10-11 NOTE — Progress Notes (Signed)
PT Cancellation Note  Patient Details Name: Mike Mack MRN: [id number] DOB: 1936-02-14   Cancelled Treatment:    Pt assessed yesterday and has no skilled Acute PT needs. Pt requires Total Assist/Care at baseline. Will sign off at this time.  Wynn Maudlin, DPT Acute Rehabilitation Services Office [phone]  10/11/22 3:12 PM

## 2022-10-11 NOTE — Progress Notes (Signed)
OT Cancellation Note  Patient Details Name: Mike Mack MRN: [id number] DOB: 1936-01-07   Cancelled Treatment:    Reason Eval/Treat Not Completed: OT screened, no needs identified, will sign off. Pt requires total assist at baseline. No OT needs.  Evern Bio 10/11/2022, 8:04 AM Berna Spare, OTR/L Acute Rehabilitation Services Office: [phone]

## 2022-10-11 NOTE — Plan of Care (Signed)

## 2022-10-12 DIAGNOSIS — G9341 Metabolic encephalopathy: Secondary | ICD-10-CM | POA: Diagnosis not present

## 2022-10-12 LAB — MAGNESIUM: Magnesium: 1.9 mg/dL (ref 1.7–2.4)

## 2022-10-12 LAB — BASIC METABOLIC PANEL
Anion gap: 8 (ref 5–15)
BUN: 15 mg/dL (ref 8–23)
CO2: 23 mmol/L (ref 22–32)
Calcium: 8.6 mg/dL — ABNORMAL LOW (ref 8.9–10.3)
Chloride: 107 mmol/L (ref 98–111)
Creatinine, Ser: 1.16 mg/dL (ref 0.61–1.24)
GFR, Estimated: >60 mL/min (ref >60)
Glucose, Bld: 131 mg/dL — ABNORMAL HIGH (ref 70–99)
Potassium: 3.7 mmol/L (ref 3.5–5.1)
Sodium: 138 mmol/L (ref 135–145)

## 2022-10-12 LAB — CBC WITH DIFFERENTIAL/PLATELET
Abs Immature Granulocytes: 0.01 10*3/uL (ref 0.00–0.07)
Basophils Absolute: 0 10*3/uL (ref 0.0–0.1)
Basophils Relative: 1 %
Eosinophils Absolute: 0 10*3/uL (ref 0.0–0.5)
Eosinophils Relative: 1 %
HCT: 29.8 % — ABNORMAL LOW (ref 39.0–52.0)
Hemoglobin: 9.9 g/dL — ABNORMAL LOW (ref 13.0–17.0)
Immature Granulocytes: 0 %
Lymphocytes Relative: 39 %
Lymphs Abs: 2.3 10*3/uL (ref 0.7–4.0)
MCH: 30.4 pg (ref 26.0–34.0)
MCHC: 33.2 g/dL (ref 30.0–36.0)
MCV: 91.4 fL (ref 80.0–100.0)
Monocytes Absolute: 0.5 10*3/uL (ref 0.1–1.0)
Monocytes Relative: 8 %
Neutro Abs: 2.9 10*3/uL (ref 1.7–7.7)
Neutrophils Relative %: 51 %
Platelets: 205 10*3/uL (ref 150–400)
RBC: 3.26 MIL/uL — ABNORMAL LOW (ref 4.22–5.81)
RDW: 13.4 % (ref 11.5–15.5)
WBC: 5.7 10*3/uL (ref 4.0–10.5)
nRBC: 0 % (ref 0.0–0.2)

## 2022-10-12 LAB — CULTURE, BLOOD (ROUTINE X 2)

## 2022-10-12 LAB — GLUCOSE, CAPILLARY
Glucose-Capillary: 108 mg/dL — ABNORMAL HIGH (ref 70–99)
Glucose-Capillary: 128 mg/dL — ABNORMAL HIGH (ref 70–99)
Glucose-Capillary: 158 mg/dL — ABNORMAL HIGH (ref 70–99)
Glucose-Capillary: 192 mg/dL — ABNORMAL HIGH (ref 70–99)

## 2022-10-12 LAB — BRAIN NATRIURETIC PEPTIDE: B Natriuretic Peptide: 16.8 pg/mL (ref 0.0–100.0)

## 2022-10-12 NOTE — Progress Notes (Signed)
PROGRESS NOTE                                                                                                                                                                                                             Patient Demographics:    Mike Mack, is a 87 y.o. male, DOB - May 24, 1936, ZOX:[id number]  Outpatient Primary MD for the patient is Georgianne Fick, MD    LOS - 2  Admit date - 10/09/2022    Chief Complaint  Patient presents with   Altered Mental Status       Brief Narrative (HPI from H&P)     87 y.o. male with medical history significant for BPH, essential pretension, type 2 diabetes mellitus, acquired hypothyroidism, who is admitted to [facility] on 10/09/2022 with acute metabolic encephalopathy after presenting from home to [facility] ED complaining of altered mental status.  Other workup in the ER was suggestive of UTI along with dehydration and he was admitted to the hospital.   Subjective:   Patient in bed, appears comfortable, denies any headache, no fever, no chest pain or pressure, no shortness of breath , no abdominal pain. No new focal weakness.   Assessment  & Plan :    Acute metabolic encephalopathy.  Caused by UTI, much improved, no headache or focal deficits which are new, MRI brain stable except for possible underlying mild NPH, continue antibiotics and gentle hydration with IV fluids.  Follow urine cultures.  Advance activity.  PT OT and speech.  At baseline bedbound.  Severe sepsis upon admission.  Resolved.  AKI due to dehydration.  Resolved.  Essential hypertension.  Blood pressure stable resume home dose Norvasc.  BPH.  On Flomax.  Will monitor it bladder scans in the setting of UTI to make sure proper emptying.  Hypothyroidism.  Currently has developed iatrogenic hyperthyroidism, free T4 hide TSH suppressed, hold home dose Synthroid for 1 week then resume at a lower dose, PCP  to monitor.  History of polio, right lower extremity weakness, right lower extremity chronic fracture.  Bedbound status.  Supportive care.    DM type II.  On sliding scale.  CBG (last 3)  Recent Labs    10/11/22 1630 10/11/22 2126 10/12/22 0803  GLUCAP 166* 186* 108*         Condition - Extremely Guarded  Family Communication  : Wife [name] in detail over the phone on 10/11/2022  Code Status :  DNR  Consults  :  None  PUD Prophylaxis : PPI   Procedures  :     CT - 1. Prominent stool-filled colon suggesting constipation. Clinical correlation suggested. No evidence of bowel obstruction. 2. Esophageal wall thickening likely indicating reflux disease with small epiphrenic diverticulum. 3. Decreasing size of right pleural effusion. Persistent finding of pleural thickening and calcification, likely chronic. 4. Bladder wall thickening is likely due to under distention as this was not present on previous study. 5. Marked enlargement of the prostate gland.  MRI -  1. No acute intracranial abnormality. 2. Age-related cerebral atrophy with moderate chronic microvascular ischemic disease. 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 3. Ventriculomegaly somewhat out of proportion to cortical sulcation. While this finding could be related to underlying atrophy, a degree of NPH could be contributory, and could be considered in the correct clinical setting.      Disposition Plan  :    Status is: Inpatient   DVT Prophylaxis  :    heparin injection 5,000 Units Start: 10/11/22 1400 SCDs Start: 10/10/22 0414    Lab Results  Component Value Date   PLT 205 10/12/2022    Diet :  Diet Order             Diet regular Room service appropriate? Yes; Fluid consistency: Thin  Diet effective now                    Inpatient Medications  Scheduled Meds:  aspirin EC  81 mg Oral Daily   atorvastatin  20 mg Oral QPM   brimonidine  1 drop Both Eyes TID   docusate  sodium  200 mg Oral BID   heparin injection (subcutaneous)  5,000 Units Subcutaneous Q8H   insulin aspart  0-6 Units Subcutaneous TID WC   [START ON 10/16/2022] levothyroxine  50 mcg Oral Q0600   memantine  10 mg Oral BID   pantoprazole  40 mg Oral Daily   polyethylene glycol  17 g Oral BID   tamsulosin  0.4 mg Oral Daily   Continuous Infusions:  cefTRIAXone (ROCEPHIN)  IV 1 g (10/12/22 0256)   PRN Meds:.acetaminophen **OR** acetaminophen, melatonin, ondansetron (ZOFRAN) IV, mouth rinse    Objective:   Vitals:   10/11/22 1958 10/11/22 2300 10/12/22 0330 10/12/22 0806  BP: 130/69 (!) 153/76 124/87 (!) 145/74  Pulse: 83 85 80 92  Resp: 19 18 17 16   Temp: 98.2 F (36.8 C) 98.3 F (36.8 C) 98.3 F (36.8 C) 99.8 F (37.7 C)  TempSrc: Oral Oral Oral Oral  SpO2:    96%  Weight:      Height:        Wt Readings from Last 3 Encounters:  10/10/22 68 kg  09/05/22 68.4 kg  08/22/22 66.7 kg     Intake/Output Summary (Last 24 hours) at 10/12/2022 0941 Last data filed at 10/12/2022 0857 Gross per 24 hour  Intake 1970.06 ml  Output 951 ml  Net 1019.06 ml     Physical Exam  Awake Alert, No new F.N deficits, chronic right lower extremity weakness from previous polio, bedbound at baseline, [place].AT,PERRAL Supple Neck, No JVD,   Symmetrical Chest wall movement, Good air movement bilaterally, CTAB RRR,No Gallops,Rubs or new Murmurs,  +ve B.Sounds, Abd Soft, No tenderness,   No Cyanosis, Clubbing or edema       Data Review:  Recent Labs  Lab 10/09/22 1951 10/09/22 2017 10/10/22 0421 10/11/22 1051 10/12/22 0437  WBC 12.0*  --  9.7 5.9 5.7  HGB 13.2 14.3 11.2* 9.5* 9.9*  HCT 39.4 42.0 32.9* 27.6* 29.8*  PLT 258  --  248 192 205  MCV 91.4  --  89.2 90.5 91.4  MCH 30.6  --  30.4 31.1 30.4  MCHC 33.5  --  34.0 34.4 33.2  RDW 13.4  --  13.4 13.6 13.4  LYMPHSABS 1.3  --  2.0  --  2.3  MONOABS 0.4  --  0.5  --  0.5  EOSABS 0.0  --  0.0  --  0.0  BASOSABS 0.0  --  0.0  --   0.0    Recent Labs  Lab 10/09/22 1951 10/09/22 2017 10/09/22 2350 10/10/22 0421 10/10/22 0433 10/10/22 0438 10/10/22 0510 10/11/22 1051 10/12/22 0437  NA 136 138  --  136  --   --   --  137 138  K 5.4* 5.8* 4.9 3.9  --   --   --  3.8 3.7  CL 101  --   --  104  --   --   --  106 107  CO2 23  --   --  24  --   --   --  20* 23  ANIONGAP 12  --   --  8  --   --   --  11 8  GLUCOSE 251*  --   --  199*  --   --   --  234* 131*  BUN 20  --   --  18  --   --   --  15 15  CREATININE 1.32*  --   --  1.23  --   --   --  1.05 1.16  AST 27  --   --  15  --   --   --   --   --   ALT 17  --   --  13  --   --   --   --   --   ALKPHOS 142*  --   --  121  --   --   --   --   --   BILITOT 1.5*  --   --  0.7  --   --   --   --   --   ALBUMIN 3.6  --   --  3.0*  --   --   --   --   --   PROCALCITON  --   --   --  0.15  --   --   --   --   --   LATICACIDVEN 2.0*  --   --   --   --  1.4  --   --   --   INR  --   --   --   --   --   --  1.3*  --   --   TSH  --   --   --   --  0.264*  --   --   --   --   HGBA1C  --   --   --   --  6.8*  --   --   --   --   AMMONIA 36*  --   --   --   --   --   --   --   --  BNP  --   --   --   --   --   --   --   --  16.8  MG  --   --   --  1.7  --   --   --   --  1.9  CALCIUM 9.8  --   --  9.0  --   --   --  8.6* 8.6*      Recent Labs  Lab 10/09/22 1951 10/10/22 0421 10/10/22 0433 10/10/22 0438 10/10/22 0510 10/11/22 1051 10/12/22 0437  PROCALCITON  --  0.15  --   --   --   --   --   LATICACIDVEN 2.0*  --   --  1.4  --   --   --   INR  --   --   --   --  1.3*  --   --   TSH  --   --  0.264*  --   --   --   --   HGBA1C  --   --  6.8*  --   --   --   --   AMMONIA 36*  --   --   --   --   --   --   BNP  --   --   --   --   --   --  16.8  MG  --  1.7  --   --   --   --  1.9  CALCIUM 9.8 9.0  --   --   --  8.6* 8.6*   Lab Results  Component Value Date   CHOL 94 04/01/2022   HDL 44 04/01/2022   LDLCALC 38 04/01/2022   TRIG 59 04/01/2022   CHOLHDL  2.1 04/01/2022    Lab Results  Component Value Date   HGBA1C 6.8 (H) 10/10/2022    Recent Labs    10/10/22 0433  TSH 0.264*    Radiology Reports MR BRAIN WO CONTRAST  Result Date: 10/10/2022 CLINICAL DATA:  Initial evaluation for mental status change, unknown cause. EXAM: MRI HEAD WITHOUT CONTRAST TECHNIQUE: Multiplanar, multiecho pulse sequences of the brain and surrounding structures were obtained without intravenous contrast. COMPARISON:  CT from 10/09/2022. FINDINGS: Brain: Examination degraded by motion artifact. Generalized age-related cerebral atrophy. Patchy T2/FLAIR hyperintensity involving the periventricular and deep white matter both cerebral hemispheres, most characteristic of chronic microvascular ischemic disease, moderately advanced in nature. Remote infarcts involving the left thalamus and bilateral cerebellar hemispheres noted. No evidence for acute or subacute infarct. Gray-white matter differentiation maintained. No acute or chronic intracranial blood products. No mass lesion, mass effect or midline shift. Diffuse ventriculomegaly somewhat out of proportion to cortical sulcation, similar to prior. No extra-axial fluid collection. Pituitary gland and suprasellar region grossly within normal limits. Vascular: Major intracranial vascular flow voids are maintained. Skull and upper cervical spine: Degenerative osteoarthritic changes noted about the skull base. Craniocervical junction otherwise unremarkable. Bone marrow signal intensity within normal limits. No scalp soft tissue abnormality. Sinuses/Orbits: Prior bilateral ocular lens replacement. Paranasal sinuses are largely clear. Small to moderate right with small left mastoid effusions. Visualized nasopharynx unremarkable. Other: None. IMPRESSION: 1. No acute intracranial abnormality. 2. Age-related cerebral atrophy with moderate chronic microvascular ischemic disease. 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 3.  Ventriculomegaly somewhat out of proportion to cortical sulcation. While this finding could be related to underlying atrophy, a degree of NPH could be contributory, and could be considered in  the correct clinical setting. Electronically Signed   By: Rise Mu M.D.   On: 10/10/2022 02:20   CT ABDOMEN PELVIS W CONTRAST  Result Date: 10/09/2022 CLINICAL DATA:  Right lower quadrant abdominal pain. Altered mental status. EXAM: CT ABDOMEN AND PELVIS WITH CONTRAST TECHNIQUE: Multidetector CT imaging of the abdomen and pelvis was performed using the standard protocol following bolus administration of intravenous contrast. RADIATION DOSE REDUCTION: This exam was performed according to the departmental dose-optimization program which includes automated exposure control, adjustment of the mA and/or kV according to patient size and/or use of iterative reconstruction technique. CONTRAST:  75mL OMNIPAQUE IOHEXOL 350 MG/ML SOLN COMPARISON:  08/29/2022 FINDINGS: Lower chest: Small right pleural effusion with mild right pleural thickening and calcification. Effusion is improved since prior study. Wall thickening in the distal esophagus suggesting reflux disease or esophagitis. No proximal dilatation. Epiphrenic diverticulum of the distal esophagus. Hepatobiliary: No focal liver abnormality is seen. Status post cholecystectomy. No biliary dilatation. Pancreas: Unremarkable. No pancreatic ductal dilatation or surrounding inflammatory changes. Spleen: Normal in size without focal abnormality. Adrenals/Urinary Tract: No adrenal gland nodules. Kidneys are atrophic and scarred. Nephrograms are symmetrical. Bilateral renal cysts, largest on the left measuring 3.9 cm diameter. No change since prior study. No imaging follow-up is indicated. No hydronephrosis or hydroureter. Bladder is decompressed. Suggestion of asymmetrical bladder wall thickening to the left, likely due to under distention. No significant wall thickening was  notice on prior study with better distention. Stomach/Bowel: Stomach, small bowel, and colon are not abnormally distended. No wall thickening or inflammatory changes. Colon is diffusely stool-filled suggesting possible constipation. Clinical correlation is suggested. Appendix is normal. Vascular/Lymphatic: No significant vascular findings are present. No enlarged abdominal or pelvic lymph nodes. Reproductive: Prostate gland is markedly enlarged, measuring 6.2 cm diameter. Other: No free air or free fluid in the abdomen. Abdominal wall musculature appears intact. Fatty atrophy of the right hip musculature. Musculoskeletal: Degenerative changes and scoliosis of the lumbar spine. IMPRESSION: 1. Prominent stool-filled colon suggesting constipation. Clinical correlation suggested. No evidence of bowel obstruction. 2. Esophageal wall thickening likely indicating reflux disease with small epiphrenic diverticulum. 3. Decreasing size of right pleural effusion. Persistent finding of pleural thickening and calcification, likely chronic. 4. Bladder wall thickening is likely due to under distention as this was not present on previous study. 5. Marked enlargement of the prostate gland. Electronically Signed   By: Burman Nieves M.D.   On: 10/09/2022 22:43   CT HEAD WO CONTRAST  Result Date: 10/09/2022 CLINICAL DATA:  Mental status change, unknown cause. EXAM: CT HEAD WITHOUT CONTRAST TECHNIQUE: Contiguous axial images were obtained from the base of the skull through the vertex without intravenous contrast. RADIATION DOSE REDUCTION: This exam was performed according to the departmental dose-optimization program which includes automated exposure control, adjustment of the mA and/or kV according to patient size and/or use of iterative reconstruction technique. COMPARISON:  08/21/2022. FINDINGS: Brain: No acute intracranial hemorrhage, midline shift or mass effect. No extra-axial fluid collection. Extensive subcortical and  periventricular white matter hypodensities are present bilaterally. Diffuse atrophy is noted. Old lacunar infarcts are present in the cerebellar hemispheres. There is mild hydrocephalus out of proportion to degree of atrophy, unchanged from the previous exam. Vascular: No hyperdense vessel or unexpected calcification. Skull: Normal. Negative for fracture or focal lesion. Sinuses/Orbits: No acute finding. Other: None. IMPRESSION: 1. No acute intracranial process. 2. Atrophy with chronic microvascular ischemic changes. 3. Stable ventriculomegaly, which may be associated with normal pressure hydrocephalus. Electronically Signed  By: Thornell Sartorius M.D.   On: 10/09/2022 20:24   DG Chest Port 1 View  Result Date: 10/09/2022 CLINICAL DATA:  Altered mental status EXAM: PORTABLE CHEST 1 VIEW COMPARISON:  08/29/2022 FINDINGS: Shallow inspiration. Mild blunting of costophrenic angles suggesting small pleural effusions, decreased since prior study. Atelectasis in the lung bases. Heart size and pulmonary vascularity are normal. No pneumothorax. Mediastinal contours appear intact. IMPRESSION: Shallow inspiration. Blunting of costophrenic angles suggesting small effusions, decreased since prior study. Electronically Signed   By: Burman Nieves M.D.   On: 10/09/2022 19:37      Signature  -   Susa Raring M.D on 10/12/2022 at 9:41 AM   -  To page go to [email or web address]

## 2022-10-13 ENCOUNTER — Other Ambulatory Visit (HOSPITAL_COMMUNITY): Payer: Self-pay

## 2022-10-13 DIAGNOSIS — G9341 Metabolic encephalopathy: Secondary | ICD-10-CM | POA: Diagnosis not present

## 2022-10-13 LAB — GLUCOSE, CAPILLARY: Glucose-Capillary: 107 mg/dL — ABNORMAL HIGH (ref 70–99)

## 2022-10-13 MED ORDER — LEVOTHYROXINE SODIUM 50 MCG PO TABS
50.0000 ug | ORAL_TABLET | Freq: Every day | ORAL | 0 refills | Status: AC
  Filled 2022-10-13: qty 30, 30d supply, fill #0

## 2022-10-13 MED ORDER — CEPHALEXIN 500 MG PO CAPS
500.0000 mg | ORAL_CAPSULE | Freq: Three times a day (TID) | ORAL | 0 refills | Status: AC
  Filled 2022-10-13: qty 15, 5d supply, fill #0

## 2022-10-13 NOTE — TOC Progression Note (Signed)
Transition of Care [facility]) - Progression Note    Patient Details  Name: Mike Mack MRN: [id number] Date of Birth: 1936-04-16  Transition of [facility] CM/SW Contact  Gordy Clement, RN Phone Number: 10/13/2022, 8:57 AM  Clinical Narrative:     CM placed call to Wife to discuss timing for PTAR transport to home.  Unable to reach Wife.  Will try again.  Patient dc'ing to Home today with Hospice      Expected Discharge Plan: Home w Hospice Care ([facility] from Home with [facility] services) Barriers to Discharge: Continued Medical Work up  Expected Discharge Plan and Services   Discharge Planning Services: CM Consult   Living arrangements for the past 2 months: Single Family Home Expected Discharge Date: 10/13/22               DME Arranged:  (TBD) DME Agency: [employer]     Representative spoke with at DME Agency: Trena Platt   Liaison made aware Patient is here [facility] Arranged:  (TBD  May return home with Hospice  Recs TBD) HH Agency:  ([facility]) Date [facility] Agency Contacted: 10/10/22 Time HH Agency Contacted: 1539 Representative spoke with at [facility] Agency: [employer] education careers [employer]   Social Determinants of Health (SDOH) Interventions SDOH Screenings   Food Insecurity: No Food Insecurity (08/21/2022)  Housing: Low Risk  (08/21/2022)  Transportation Needs: No Transportation Needs (08/21/2022)  Utilities: Not At Risk (08/21/2022)  Tobacco Use: Low Risk  (10/10/2022)    Readmission Risk Interventions    09/04/2022   10:29 AM 08/24/2022   10:47 AM  Readmission Risk Prevention Plan  Transportation Screening Complete Complete  PCP or Specialist Appt within 3-5 Days  Complete  HRI or Home Care Consult  Complete  Social Work Consult for Recovery Care Planning/Counseling  Complete  Palliative Care Screening  Not Applicable  Medication Review Oceanographer) Complete Referral to Pharmacy  PCP or Specialist appointment within 3-5 days of discharge Complete   HRI or Home Care Consult  Complete   SW Recovery Care/Counseling Consult Complete   Palliative Care Screening Not Applicable   Skilled Nursing Facility Not Applicable

## 2022-10-13 NOTE — Discharge Summary (Signed)
Mike Mack WUJ:[id number] DOB: March 28, 1936 DOA: 10/09/2022  PCP: Georgianne Fick, MD  Admit date: 10/09/2022  Discharge date: 10/13/2022  Admitted From: Home   Disposition:  Home   Recommendations for Outpatient Follow-up:   Follow up with PCP in 1-2 weeks  PCP Please obtain BMP/CBC, 2 view CXR in 1week,  (see Discharge instructions)   PCP Please follow up on the following pending results: TSH, free T4, CBC and BMP in 7 to 10 days.  Monitor Synthroid dose closely.   Home Health: PT, Aide if qualifies   Equipment/Devices: Environmental consultant  Consultations: None  Discharge Condition: Stable    CODE STATUS: Full    Diet Recommendation: Heart Healthy     Chief Complaint  Patient presents with   Altered Mental Status     Brief history of present illness from the day of admission and additional interim summary    87 y.o. male with medical history significant for BPH, essential pretension, type 2 diabetes mellitus, acquired hypothyroidism, who is admitted to [facility] on 10/09/2022 with acute metabolic encephalopathy after presenting from home to [facility] ED complaining of altered mental status.  Other workup in the ER was suggestive of UTI along with dehydration and he was admitted to the hospital.                                                                  Hospital Course   Acute metabolic encephalopathy.  Caused by UTI, much improved, no headache or focal deficits which are new, MRI brain stable except for possible underlying mild NPH,.  With IV fluids and IV antibiotics, encephalopathy has completely resolved, stable bladder scans postvoid, cultures thus far negative will be placed on 5 more days of oral Keflex and discharged home.  He is back to his baseline.   Severe sepsis upon admission.  Resolved.    AKI due to dehydration.  Resolved.   Essential hypertension.  Blood pressure stable resume occasions unchanged.   BPH.  On Flomax.  Will postvoid bladder scans..   Hypothyroidism.  Currently has developed iatrogenic hyperthyroidism, free T4 hide TSH suppressed, hold home dose Synthroid for 1 week then resume at a lower dose, PCP to monitor.   History of polio, right lower extremity weakness, right lower extremity chronic fracture.  Bedbound status.  Supportive care care, home PT and health aide if he qualifies.   DM type II.  Continue home regimen.    Discharge diagnosis     Principal Problem:   Acute metabolic encephalopathy Active Problems:   Essential hypertension   BPH (benign prostatic hyperplasia)   DM2 (diabetes mellitus, type 2) (HCC)   Severe sepsis (HCC)   Acquired hypothyroidism   Acute cystitis   Hyperkalemia   Dehydration    Discharge instructions  Discharge Instructions     Diet - low sodium heart healthy   Complete by: As directed    Discharge instructions   Complete by: As directed    Follow with Primary MD Georgianne Fick, MD in 7 days   Get CBC, CMP, 2 view Chest X ray -  checked next visit with your primary MD   Activity: As tolerated with Full fall precautions use walker/cane & assistance as needed  Disposition Home    Diet: Heart Healthy   Special Instructions: If you have smoked or chewed Tobacco  in the last 2 yrs please stop smoking, stop any regular Alcohol  and or any Recreational drug use.  On your next visit with your primary care physician please Get Medicines reviewed and adjusted.  Please request your Prim.MD to go over all Hospital Tests and Procedure/Radiological results at the follow up, please get all Hospital records sent to your Prim MD by signing hospital release before you go home.  If you experience worsening of your admission symptoms, develop shortness of breath, life threatening emergency, suicidal or homicidal  thoughts you must seek medical attention immediately by calling 911 or calling your MD immediately  if symptoms less severe.  You Must read complete instructions/literature along with all the possible adverse reactions/side effects for all the Medicines you take and that have been prescribed to you. Take any new Medicines after you have completely understood and accpet all the possible adverse reactions/side effects.   Increase activity slowly   Complete by: As directed        Discharge Medications   Allergies as of 10/13/2022       Reactions   Insulin Glargine    Insulin Glargine-lixisenatide Other (See Comments), Nausea And Vomiting   Stomach cramps   Metformin And Related Nausea And Vomiting, Other (See Comments)   Gi intolerance   Nsaids Other (See Comments)   Renal insufficiency        Medication List     TAKE these medications    acetaminophen 325 MG tablet Commonly known as: TYLENOL Take 2 tablets (650 mg total) by mouth every 6 (six) hours as needed for mild pain (or Fever >/= 101).   amLODipine 2.5 MG tablet Commonly known as: NORVASC Take 1 tablet (2.5 mg total) by mouth daily.   aspirin EC 81 MG tablet Take 1 tablet (81 mg total) by mouth daily. Swallow whole.   atorvastatin 20 MG tablet Commonly known as: LIPITOR Take 20 mg by mouth every evening.   B COMPLEX PO Take 1 tablet by mouth at bedtime.   brimonidine 0.2 % ophthalmic solution Commonly known as: ALPHAGAN Place 1 drop into both eyes 3 (three) times daily.   cephALEXin 500 MG capsule Commonly known as: KEFLEX Take 1 capsule (500 mg total) by mouth 3 (three) times daily for 5 days.   cholecalciferol 10 MCG (400 UNIT) Tabs tablet Commonly known as: VITAMIN D3 Take 400 Units by mouth at bedtime.   dorzolamide-timolol 2-0.5 % ophthalmic solution Commonly known as: COSOPT Place 1 drop into both eyes 2 (two) times daily.   Kerendia 10 MG Tabs Generic drug: Finerenone Take 10 mg by mouth  daily.   latanoprost 0.005 % ophthalmic solution Commonly known as: XALATAN Place 1 drop into both eyes at bedtime.   levothyroxine 50 MCG tablet Commonly known as: SYNTHROID Take 1 tablet (50 mcg total) by mouth daily at 6 (six) AM. Start taking on: October 16, 2022 What changed:  medication strength  how much to take when to take this These instructions start on October 16, 2022. If you are unsure what to do until then, ask your doctor or other care provider.   memantine 10 MG tablet Commonly known as: NAMENDA Take 10 mg by mouth in the morning and at bedtime.   pantoprazole 40 MG tablet Commonly known as: Protonix Take 1 tablet (40 mg total) by mouth daily.   polyethylene glycol 17 g packet Commonly known as: MIRALAX / GLYCOLAX Take 17 g by mouth daily.   Rhopressa 0.02 % Soln Generic drug: Netarsudil Dimesylate Place 1 drop into both eyes at bedtime.   senna-docusate 8.6-50 MG tablet Commonly known as: Senokot-S Take 1 tablet by mouth 2 (two) times daily. What changed: when to take this   tamsulosin 0.4 MG Caps capsule Commonly known as: FLOMAX Take 0.4 mg by mouth at bedtime.               Durable Medical Equipment  (From admission, onward)           Start     Ordered   10/13/22 0827  For home use only DME Walker rolling  Once       Comments: 5 wheel  Question Answer Comment  Walker: With 5 Inch Wheels   Patient needs a walker to treat with the following condition Weakness      10/13/22 0826             Follow-up Information     [facility] [place], [place] Follow up.   Why: Aldine Contes will continue providing Hospice services Contact information: [address] [facility] [place] [address] [phone]         Georgianne Fick, MD. Schedule an appointment as soon as possible for a visit in 1 week(s).   Specialty: Internal Medicine Contact information: [address] [place] [address] [place]  [place] [address] [phone]                 Major procedures and Radiology Reports - PLEASE review detailed and final reports thoroughly  -       MR BRAIN WO CONTRAST  Result Date: 10/10/2022 CLINICAL DATA:  Initial evaluation for mental status change, unknown cause. EXAM: MRI HEAD WITHOUT CONTRAST TECHNIQUE: Multiplanar, multiecho pulse sequences of the brain and surrounding structures were obtained without intravenous contrast. COMPARISON:  CT from 10/09/2022. FINDINGS: Brain: Examination degraded by motion artifact. Generalized age-related cerebral atrophy. Patchy T2/FLAIR hyperintensity involving the periventricular and deep white matter both cerebral hemispheres, most characteristic of chronic microvascular ischemic disease, moderately advanced in nature. Remote infarcts involving the left thalamus and bilateral cerebellar hemispheres noted. No evidence for acute or subacute infarct. Gray-white matter differentiation maintained. No acute or chronic intracranial blood products. No mass lesion, mass effect or midline shift. Diffuse ventriculomegaly somewhat out of proportion to cortical sulcation, similar to prior. No extra-axial fluid collection. Pituitary gland and suprasellar region grossly within normal limits. Vascular: Major intracranial vascular flow voids are maintained. Skull and upper cervical spine: Degenerative osteoarthritic changes noted about the skull base. Craniocervical junction otherwise unremarkable. Bone marrow signal intensity within normal limits. No scalp soft tissue abnormality. Sinuses/Orbits: Prior bilateral ocular lens replacement. Paranasal sinuses are largely clear. Small to moderate right with small left mastoid effusions. Visualized nasopharynx unremarkable. Other: None. IMPRESSION: 1. No acute intracranial abnormality. 2. Age-related cerebral atrophy with moderate chronic microvascular ischemic disease. 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 3.  Ventriculomegaly somewhat out  of proportion to cortical sulcation. While this finding could be related to underlying atrophy, a degree of NPH could be contributory, and could be considered in the correct clinical setting. Electronically Signed   By: Rise Mu M.D.   On: 10/10/2022 02:20   CT ABDOMEN PELVIS W CONTRAST  Result Date: 10/09/2022 CLINICAL DATA:  Right lower quadrant abdominal pain. Altered mental status. EXAM: CT ABDOMEN AND PELVIS WITH CONTRAST TECHNIQUE: Multidetector CT imaging of the abdomen and pelvis was performed using the standard protocol following bolus administration of intravenous contrast. RADIATION DOSE REDUCTION: This exam was performed according to the departmental dose-optimization program which includes automated exposure control, adjustment of the mA and/or kV according to patient size and/or use of iterative reconstruction technique. CONTRAST:  75mL OMNIPAQUE IOHEXOL 350 MG/ML SOLN COMPARISON:  08/29/2022 FINDINGS: Lower chest: Small right pleural effusion with mild right pleural thickening and calcification. Effusion is improved since prior study. Wall thickening in the distal esophagus suggesting reflux disease or esophagitis. No proximal dilatation. Epiphrenic diverticulum of the distal esophagus. Hepatobiliary: No focal liver abnormality is seen. Status post cholecystectomy. No biliary dilatation. Pancreas: Unremarkable. No pancreatic ductal dilatation or surrounding inflammatory changes. Spleen: Normal in size without focal abnormality. Adrenals/Urinary Tract: No adrenal gland nodules. Kidneys are atrophic and scarred. Nephrograms are symmetrical. Bilateral renal cysts, largest on the left measuring 3.9 cm diameter. No change since prior study. No imaging follow-up is indicated. No hydronephrosis or hydroureter. Bladder is decompressed. Suggestion of asymmetrical bladder wall thickening to the left, likely due to under distention. No significant wall thickening was  notice on prior study with better distention. Stomach/Bowel: Stomach, small bowel, and colon are not abnormally distended. No wall thickening or inflammatory changes. Colon is diffusely stool-filled suggesting possible constipation. Clinical correlation is suggested. Appendix is normal. Vascular/Lymphatic: No significant vascular findings are present. No enlarged abdominal or pelvic lymph nodes. Reproductive: Prostate gland is markedly enlarged, measuring 6.2 cm diameter. Other: No free air or free fluid in the abdomen. Abdominal wall musculature appears intact. Fatty atrophy of the right hip musculature. Musculoskeletal: Degenerative changes and scoliosis of the lumbar spine. IMPRESSION: 1. Prominent stool-filled colon suggesting constipation. Clinical correlation suggested. No evidence of bowel obstruction. 2. Esophageal wall thickening likely indicating reflux disease with small epiphrenic diverticulum. 3. Decreasing size of right pleural effusion. Persistent finding of pleural thickening and calcification, likely chronic. 4. Bladder wall thickening is likely due to under distention as this was not present on previous study. 5. Marked enlargement of the prostate gland. Electronically Signed   By: Burman Nieves M.D.   On: 10/09/2022 22:43   CT HEAD WO CONTRAST  Result Date: 10/09/2022 CLINICAL DATA:  Mental status change, unknown cause. EXAM: CT HEAD WITHOUT CONTRAST TECHNIQUE: Contiguous axial images were obtained from the base of the skull through the vertex without intravenous contrast. RADIATION DOSE REDUCTION: This exam was performed according to the departmental dose-optimization program which includes automated exposure control, adjustment of the mA and/or kV according to patient size and/or use of iterative reconstruction technique. COMPARISON:  08/21/2022. FINDINGS: Brain: No acute intracranial hemorrhage, midline shift or mass effect. No extra-axial fluid collection. Extensive subcortical and  periventricular white matter hypodensities are present bilaterally. Diffuse atrophy is noted. Old lacunar infarcts are present in the cerebellar hemispheres. There is mild hydrocephalus out of proportion to degree of atrophy, unchanged from the previous exam. Vascular: No hyperdense vessel or unexpected calcification. Skull: Normal. Negative for fracture or focal lesion. Sinuses/Orbits: No acute finding. Other: None. IMPRESSION:  1. No acute intracranial process. 2. Atrophy with chronic microvascular ischemic changes. 3. Stable ventriculomegaly, which may be associated with normal pressure hydrocephalus. Electronically Signed   By: Thornell Sartorius M.D.   On: 10/09/2022 20:24   DG Chest Port 1 View  Result Date: 10/09/2022 CLINICAL DATA:  Altered mental status EXAM: PORTABLE CHEST 1 VIEW COMPARISON:  08/29/2022 FINDINGS: Shallow inspiration. Mild blunting of costophrenic angles suggesting small pleural effusions, decreased since prior study. Atelectasis in the lung bases. Heart size and pulmonary vascularity are normal. No pneumothorax. Mediastinal contours appear intact. IMPRESSION: Shallow inspiration. Blunting of costophrenic angles suggesting small effusions, decreased since prior study. Electronically Signed   By: Burman Nieves M.D.   On: 10/09/2022 19:37    Micro Results    Recent Results (from the past 240 hour(s))  Blood Culture (Routine X 2)     Status: None (Preliminary result)   Collection Time: 10/09/22  7:51 PM   Specimen: BLOOD  Result Value Ref Range Status   Specimen Description BLOOD RIGHT ANTECUBITAL  Final   Special Requests   Final    BOTTLES DRAWN AEROBIC AND ANAEROBIC Blood Culture adequate volume   Culture   Final    NO GROWTH 4 DAYS Performed at [facility], [address], [place], [place] [address]    Report Status PENDING  Incomplete  Blood Culture (Routine X 2)     Status: None (Preliminary result)   Collection Time: 10/09/22  8:06 PM   Specimen: BLOOD   Result Value Ref Range Status   Specimen Description BLOOD LEFT ANTECUBITAL  Final   Special Requests   Final    BOTTLES DRAWN AEROBIC AND ANAEROBIC Blood Culture results may not be optimal due to an excessive volume of blood received in culture bottles   Culture   Final    NO GROWTH 4 DAYS Performed at [facility], [address] [place], [place] [address]    Report Status PENDING  Incomplete  Urine Culture     Status: None   Collection Time: 10/10/22  2:47 AM   Specimen: Urine, Random  Result Value Ref Range Status   Specimen Description URINE, RANDOM  Final   Special Requests NONE Reflexed from [id number]  Final   Culture   Final    NO GROWTH Performed at [facility], [address] [place], [place] [address]    Report Status 10/11/2022 FINAL  Final    Today   Subjective    Mike Mack today has no headache,no chest abdominal pain,no new weakness tingling or numbness, feels much better wants to go home today.    Objective   Blood pressure 130/68, pulse 83, temperature 98.1 F (36.7 C), temperature source Oral, resp. rate 16, height 5\' 5"  (1.651 m), weight 68 kg, SpO2 90 %.   Intake/Output Summary (Last 24 hours) at 10/13/2022 0830 Last data filed at 10/13/2022 0529 Gross per 24 hour  Intake 960 ml  Output 1450 ml  Net -490 ml    Exam  Awake Alert, No new F.N deficits, chronic right lower extremity weakness from previous polio, bedbound at baseline, [place].AT,PERRAL Supple Neck, No JVD,   Symmetrical Chest wall movement, Good air movement bilaterally, CTAB RRR,No Gallops,Rubs or new Murmurs,  +ve B.Sounds, Abd Soft, No tenderness,   No Cyanosis, Clubbing or edema   Data Review   Recent Labs  Lab 10/09/22 1951 10/09/22 2017 10/10/22 0421 10/11/22 1051 10/12/22 0437  WBC 12.0*  --  9.7 5.9 5.7  HGB 13.2 14.3 11.2* 9.5* 9.9*  HCT 39.4 42.0 32.9* 27.6* 29.8*  PLT 258  --  248 192 205  MCV 91.4  --  89.2 90.5 91.4  MCH 30.6  --  30.4 31.1  30.4  MCHC 33.5  --  34.0 34.4 33.2  RDW 13.4  --  13.4 13.6 13.4  LYMPHSABS 1.3  --  2.0  --  2.3  MONOABS 0.4  --  0.5  --  0.5  EOSABS 0.0  --  0.0  --  0.0  BASOSABS 0.0  --  0.0  --  0.0    Recent Labs  Lab 10/09/22 1951 10/09/22 2017 10/09/22 2350 10/10/22 0421 10/10/22 0433 10/10/22 0438 10/10/22 0510 10/11/22 1051 10/12/22 0437  NA 136 138  --  136  --   --   --  137 138  K 5.4* 5.8* 4.9 3.9  --   --   --  3.8 3.7  CL 101  --   --  104  --   --   --  106 107  CO2 23  --   --  24  --   --   --  20* 23  ANIONGAP 12  --   --  8  --   --   --  11 8  GLUCOSE 251*  --   --  199*  --   --   --  234* 131*  BUN 20  --   --  18  --   --   --  15 15  CREATININE 1.32*  --   --  1.23  --   --   --  1.05 1.16  AST 27  --   --  15  --   --   --   --   --   ALT 17  --   --  13  --   --   --   --   --   ALKPHOS 142*  --   --  121  --   --   --   --   --   BILITOT 1.5*  --   --  0.7  --   --   --   --   --   ALBUMIN 3.6  --   --  3.0*  --   --   --   --   --   PROCALCITON  --   --   --  0.15  --   --   --   --   --   LATICACIDVEN 2.0*  --   --   --   --  1.4  --   --   --   INR  --   --   --   --   --   --  1.3*  --   --   TSH  --   --   --   --  0.264*  --   --   --   --   HGBA1C  --   --   --   --  6.8*  --   --   --   --   AMMONIA 36*  --   --   --   --   --   --   --   --   BNP  --   --   --   --   --   --   --   --  16.8  MG  --   --   --  1.7  --   --   --   --  1.9  CALCIUM 9.8  --   --  9.0  --   --   --  8.6* 8.6*    Total Time in preparing paper work, data evaluation and todays exam - 35 minutes  Signature  -    Susa Raring M.D on 10/13/2022 at 8:30 AM   -  To page go to [email or web address]

## 2022-10-13 NOTE — TOC Transition Note (Signed)
Transition of Care [facility]) - CM/SW Discharge Note   Patient Details  Name: Mike Mack MRN: [id number] Date of Birth: [birth year]  [facility] CM/SW Contact:  Gordy Clement, RN Phone Number: 10/13/2022, 9:09 AM   Clinical Narrative:     Patient to DC to home w/ [facility] today.  [facility] has been called for transport. Wife is at bedside. TOC to deliver meds. No additional TOC needs       Barriers to Discharge: Continued Medical Work up   Patient Goals and CMS Choice      Discharge Placement                         Discharge Plan and Services Additional resources added to the After Visit Summary for     Discharge Planning Services: CM Consult            DME Arranged:  (TBD) DME Agency: [employer]     Representative spoke with at DME Agency: Trena Platt   Liaison made aware Patient is here [facility] Arranged:  (TBD  May return home with Hospice  Recs TBD) HH Agency:  ([facility]) Date [facility] Agency Contacted: 10/10/22 Time HH Agency Contacted: [phone] Representative spoke with at [facility] Agency: Higher education careers adviser [employer]  Social Determinants of Health (SDOH) Interventions SDOH Screenings   Food Insecurity: No Food Insecurity (08/21/2022)  Housing: Low Risk  (08/21/2022)  Transportation Needs: No Transportation Needs (08/21/2022)  Utilities: Not At Risk (08/21/2022)  Tobacco Use: Low Risk  (10/10/2022)     Readmission Risk Interventions    09/04/2022   10:29 AM 08/24/2022   10:47 AM  Readmission Risk Prevention Plan  Transportation Screening Complete Complete  PCP or Specialist Appt within 3-5 Days  Complete  HRI or Home Care Consult  Complete  Social Work Consult for Recovery Care Planning/Counseling  Complete  Palliative Care Screening  Not Applicable  Medication Review [employer]) Complete Referral to Pharmacy  PCP or Specialist appointment within 3-5 days of discharge Complete   HRI or Home Care Consult Complete   SW Recovery Care/Counseling Consult  Complete   Palliative Care Screening Not Applicable   Skilled Nursing Facility Not Applicable

## 2022-10-13 NOTE — Discharge Instructions (Signed)
Follow with Primary MD Ramachandran, Ajith, MD in 7 days   Get CBC, CMP, 2 view Chest X ray -  checked next visit with your primary MD    Activity: As tolerated with Full fall precautions use walker/cane & assistance as needed  Disposition Home    Diet: Heart Healthy   Special Instructions: If you have smoked or chewed Tobacco  in the last 2 yrs please stop smoking, stop any regular Alcohol  and or any Recreational drug use.  On your next visit with your primary care physician please Get Medicines reviewed and adjusted.  Please request your Prim.MD to go over all Hospital Tests and Procedure/Radiological results at the follow up, please get all Hospital records sent to your Prim MD by signing hospital release before you go home.  If you experience worsening of your admission symptoms, develop shortness of breath, life threatening emergency, suicidal or homicidal thoughts you must seek medical attention immediately by calling 911 or calling your MD immediately  if symptoms less severe.  You Must read complete instructions/literature along with all the possible adverse reactions/side effects for all the Medicines you take and that have been prescribed to you. Take any new Medicines after you have completely understood and accpet all the possible adverse reactions/side effects.      

## 2022-10-14 LAB — CULTURE, BLOOD (ROUTINE X 2)
Culture: NO GROWTH
Special Requests: ADEQUATE

## 2022-10-26 ENCOUNTER — Other Ambulatory Visit: Payer: Self-pay

## 2022-10-26 ENCOUNTER — Emergency Department (HOSPITAL_COMMUNITY)
Admission: EM | Admit: 2022-10-26 | Discharge: 2022-10-26 | Disposition: A | Discharge status: Home / Self Care | Attending: Emergency Medicine | Admitting: Emergency Medicine

## 2022-10-26 ENCOUNTER — Emergency Department (HOSPITAL_COMMUNITY)

## 2022-10-26 DIAGNOSIS — E119 Type 2 diabetes mellitus without complications: Secondary | ICD-10-CM | POA: Insufficient documentation

## 2022-10-26 DIAGNOSIS — Y92009 Unspecified place in unspecified non-institutional (private) residence as the place of occurrence of the external cause: Secondary | ICD-10-CM | POA: Insufficient documentation

## 2022-10-26 DIAGNOSIS — W1830XA Fall on same level, unspecified, initial encounter: Secondary | ICD-10-CM | POA: Insufficient documentation

## 2022-10-26 DIAGNOSIS — R262 Difficulty in walking, not elsewhere classified: Secondary | ICD-10-CM | POA: Diagnosis not present

## 2022-10-26 DIAGNOSIS — W19XXXA Unspecified fall, initial encounter: Secondary | ICD-10-CM | POA: Diagnosis not present

## 2022-10-26 DIAGNOSIS — Z7982 Long term (current) use of aspirin: Secondary | ICD-10-CM | POA: Insufficient documentation

## 2022-10-26 DIAGNOSIS — F0390 Unspecified dementia without behavioral disturbance: Secondary | ICD-10-CM | POA: Insufficient documentation

## 2022-10-26 DIAGNOSIS — R0902 Hypoxemia: Secondary | ICD-10-CM | POA: Diagnosis not present

## 2022-10-26 DIAGNOSIS — S199XXA Unspecified injury of neck, initial encounter: Secondary | ICD-10-CM | POA: Diagnosis not present

## 2022-10-26 DIAGNOSIS — Z7401 Bed confinement status: Secondary | ICD-10-CM | POA: Diagnosis not present

## 2022-10-26 DIAGNOSIS — R231 Pallor: Secondary | ICD-10-CM | POA: Diagnosis not present

## 2022-10-26 DIAGNOSIS — R6889 Other general symptoms and signs: Secondary | ICD-10-CM | POA: Diagnosis not present

## 2022-10-26 DIAGNOSIS — I10 Essential (primary) hypertension: Secondary | ICD-10-CM | POA: Diagnosis not present

## 2022-10-26 DIAGNOSIS — I6381 Other cerebral infarction due to occlusion or stenosis of small artery: Secondary | ICD-10-CM | POA: Diagnosis not present

## 2022-10-26 LAB — BASIC METABOLIC PANEL
Anion gap: 13 (ref 5–15)
BUN: 16 mg/dL (ref 8–23)
CO2: 21 mmol/L — ABNORMAL LOW (ref 22–32)
Calcium: 9.5 mg/dL (ref 8.9–10.3)
Chloride: 107 mmol/L (ref 98–111)
Creatinine, Ser: 1.34 mg/dL — ABNORMAL HIGH (ref 0.61–1.24)
GFR, Estimated: 52 mL/min — ABNORMAL LOW (ref >60)
Glucose, Bld: 215 mg/dL — ABNORMAL HIGH (ref 70–99)
Potassium: 3.9 mmol/L (ref 3.5–5.1)
Sodium: 141 mmol/L (ref 135–145)

## 2022-10-26 LAB — CBC WITH DIFFERENTIAL/PLATELET
Abs Immature Granulocytes: 0.06 10*3/uL (ref 0.00–0.07)
Basophils Absolute: 0.1 10*3/uL (ref 0.0–0.1)
Basophils Relative: 1 %
Eosinophils Absolute: 0 10*3/uL (ref 0.0–0.5)
Eosinophils Relative: 0 %
HCT: 39.1 % (ref 39.0–52.0)
Hemoglobin: 12.5 g/dL — ABNORMAL LOW (ref 13.0–17.0)
Immature Granulocytes: 1 %
Lymphocytes Relative: 21 %
Lymphs Abs: 2.3 10*3/uL (ref 0.7–4.0)
MCH: 30.2 pg (ref 26.0–34.0)
MCHC: 32 g/dL (ref 30.0–36.0)
MCV: 94.4 fL (ref 80.0–100.0)
Monocytes Absolute: 0.4 10*3/uL (ref 0.1–1.0)
Monocytes Relative: 4 %
Neutro Abs: 8.2 10*3/uL — ABNORMAL HIGH (ref 1.7–7.7)
Neutrophils Relative %: 73 %
Platelets: 322 10*3/uL (ref 150–400)
RBC: 4.14 MIL/uL — ABNORMAL LOW (ref 4.22–5.81)
RDW: 13.3 % (ref 11.5–15.5)
WBC: 11 10*3/uL — ABNORMAL HIGH (ref 4.0–10.5)
nRBC: 0 % (ref 0.0–0.2)

## 2022-10-26 NOTE — ED Notes (Signed)
[facility] CALLED UNABLE TO GIVE ETA OF PICKUP ONLY SAID NOT LONG

## 2022-10-26 NOTE — ED Triage Notes (Signed)
Pt BIB [facility] from home due to fall.  Pt baseline is AOX1 (to person only).  Pt was found wedged between bed and dresser at home on his bottom.  VSS

## 2022-10-26 NOTE — ED Provider Notes (Signed)
[place] EMERGENCY DEPARTMENT AT [facility] Provider Note   CSN: [id number] Arrival date & time: 10/26/22  1015     History  Chief Complaint  Patient presents with   Mike Mack is a 87 y.o. male.  Level 5 caveat due to dementia.  He is oriented only to person which she seems to be at his baseline per family.  Sounds like he fell out of bed this morning.  He was last seen sleeping this morning.  Family had taken his wife to eye doctor.  Came back and found him on the floor next to the bed.  No obvious deformity on exam per EMS.  Patient's not on blood thinners.  History of memory problems, diabetes.  The history is provided by a caregiver.       Home Medications Prior to Admission medications   Medication Sig Start Date End Date Taking? Authorizing Provider  acetaminophen (TYLENOL) 325 MG tablet Take 2 tablets (650 mg total) by mouth every 6 (six) hours as needed for mild pain (or Fever >/= 101). 11/27/20   Calvert Cantor, MD  amLODipine (NORVASC) 2.5 MG tablet Take 1 tablet (2.5 mg total) by mouth daily. 08/11/22   Azucena Fallen, MD  aspirin EC 81 MG tablet Take 1 tablet (81 mg total) by mouth daily. Swallow whole. 04/06/22   Azucena Fallen, MD  atorvastatin (LIPITOR) 20 MG tablet Take 20 mg by mouth every evening.  11/03/13   [provider]  B Complex Vitamins (B COMPLEX PO) Take 1 tablet by mouth at bedtime.    [provider]  brimonidine (ALPHAGAN) 0.2 % ophthalmic solution Place 1 drop into both eyes 3 (three) times daily.    [provider]  cholecalciferol (VITAMIN D3) 10 MCG (400 UNIT) TABS tablet Take 400 Units by mouth at bedtime.    [provider]  dorzolamide-timolol (COSOPT) 22.3-6.8 MG/ML ophthalmic solution Place 1 drop into both eyes 2 (two) times daily. 03/04/20   [provider]  Finerenone (KERENDIA) 10 MG TABS Take 10 mg by mouth daily.    [provider]  latanoprost  (XALATAN) 0.005 % ophthalmic solution Place 1 drop into both eyes at bedtime.    [provider]  levothyroxine (SYNTHROID) 50 MCG tablet Take 1 tablet (50 mcg total) by mouth daily at 6 (six) AM. 10/16/22   Pelham Sea, MD  memantine (NAMENDA) 10 MG tablet Take 10 mg by mouth in the morning and at bedtime. 11/04/20   [provider]  pantoprazole (PROTONIX) 40 MG tablet Take 1 tablet (40 mg total) by mouth daily. 07/08/22   Sherian Maroon A, PA  polyethylene glycol (MIRALAX / GLYCOLAX) 17 g packet Take 17 g by mouth daily. 09/05/22   Rodolph Bong, MD  RHOPRESSA 0.02 % SOLN Place 1 drop into both eyes at bedtime. 03/05/20   [provider]  senna-docusate (SENOKOT-S) 8.6-50 MG tablet Take 1 tablet by mouth 2 (two) times daily. Patient taking differently: Take 1 tablet by mouth in the morning. 09/05/22   Rodolph Bong, MD  Tamsulosin HCl (FLOMAX) 0.4 MG CAPS Take 0.4 mg by mouth at bedtime.    [provider]      Allergies    Insulin glargine, Insulin glargine-lixisenatide, Metformin and related, and Nsaids    Review of Systems   Review of Systems  Physical Exam Updated Vital Signs BP (!) 144/86   Pulse (!) 101   Temp  98.4 F (36.9 C) (Oral)   Resp (!) 23   Ht 5\' 5"  (1.651 m)   Wt 67 kg   SpO2 100%   BMI 24.58 kg/m  Physical Exam Vitals and nursing note reviewed.  Constitutional:      General: He is not in acute distress.    Appearance: He is well-developed. He is not ill-appearing.  HENT:     Head: Normocephalic and atraumatic.     Nose: Nose normal.     Mouth/Throat:     Mouth: Mucous membranes are moist.  Eyes:     Extraocular Movements: Extraocular movements intact.     Conjunctiva/sclera: Conjunctivae normal.     Pupils: Pupils are equal, round, and reactive to light.  Cardiovascular:     Rate and Rhythm: Normal rate and regular rhythm.     Pulses: Normal pulses.     Heart sounds: Normal heart sounds. No murmur  heard. Pulmonary:     Effort: Pulmonary effort is normal. No respiratory distress.     Breath sounds: Normal breath sounds.  Abdominal:     Palpations: Abdomen is soft.     Tenderness: There is no abdominal tenderness.  Musculoskeletal:        General: No swelling or tenderness. Normal range of motion.     Cervical back: Normal range of motion and neck supple.  Skin:    General: Skin is warm and dry.     Capillary Refill: Capillary refill takes less than 2 seconds.  Neurological:     Mental Status: He is alert. Mental status is at baseline.  Psychiatric:        Mood and Affect: Mood normal.     ED Results / Procedures / Treatments   Labs (all labs ordered are listed, but only abnormal results are displayed) Labs Reviewed  CBC WITH DIFFERENTIAL/PLATELET - Abnormal; Notable for the following components:      Result Value   WBC 11.0 (*)    RBC 4.14 (*)    Hemoglobin 12.5 (*)    Neutro Abs 8.2 (*)    All other components within normal limits  BASIC METABOLIC PANEL - Abnormal; Notable for the following components:   CO2 21 (*)    Glucose, Bld 215 (*)    Creatinine, Ser 1.34 (*)    GFR, Estimated 52 (*)    All other components within normal limits    EKG EKG Interpretation  Date/Time:  Thursday Oct 26 2022 10:21:55 EDT Ventricular Rate:  98 PR Interval:  208 QRS Duration: 82 QT Interval:  362 QTC Calculation: 463 R Axis:   26 Text Interpretation: Sinus rhythm Borderline prolonged PR interval Confirmed by Virgina Norfolk ([id number]) on 10/26/2022 10:24:06 AM  Radiology CT Head Wo Contrast  Result Date: 10/26/2022 CLINICAL DATA:  Blunt poly trauma EXAM: CT HEAD WITHOUT CONTRAST CT CERVICAL SPINE WITHOUT CONTRAST TECHNIQUE: Multidetector CT imaging of the head and cervical spine was performed following the standard protocol without intravenous contrast. Multiplanar CT image reconstructions of the cervical spine were also generated. RADIATION DOSE REDUCTION: This exam was performed  according to the departmental dose-optimization program which includes automated exposure control, adjustment of the mA and/or kV according to patient size and/or use of iterative reconstruction technique. COMPARISON:  10/09/2022 FINDINGS: CT HEAD FINDINGS Brain: No evidence of acute infarction, hemorrhage, obstructive hydrocephalus, extra-axial collection or mass lesion/mass effect. Atrophy with ventriculomegaly but there is also some disproportionate subarachnoid spaces and callosal angle narrowing. Chronic bilateral cerebellar infarcts. Chronic left thalamus  capsular lacunar infarct. Vascular: No hyperdense vessel or unexpected calcification. Skull: Normal. Negative for fracture or focal lesion. Sinuses/Orbits: No acute finding. CT CERVICAL SPINE FINDINGS Alignment: No traumatic malalignment Skull base and vertebrae: No acute fracture Soft tissues and spinal canal: No prevertebral fluid or swelling. No visible canal hematoma. 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 Disc levels:  Ordinary, generalized degenerative changes. Upper chest: No evidence of injury IMPRESSION: 1. No evidence of acute intracranial or cervical spine injury. 2. Ventriculomegaly with some features seen in normal pressure hydrocephalus. There is also brain atrophy and chronic ischemic injury. Electronically Signed   By: Tiburcio Pea M.D.   On: 10/26/2022 11:35   CT Cervical Spine Wo Contrast  Result Date: 10/26/2022 CLINICAL DATA:  Blunt poly trauma EXAM: CT HEAD WITHOUT CONTRAST CT CERVICAL SPINE WITHOUT CONTRAST TECHNIQUE: Multidetector CT imaging of the head and cervical spine was performed following the standard protocol without intravenous contrast. Multiplanar CT image reconstructions of the cervical spine were also generated. RADIATION DOSE REDUCTION: This exam was performed according to the departmental dose-optimization program which includes automated exposure control, adjustment of  the mA and/or kV according to patient size and/or use of iterative reconstruction technique. COMPARISON:  10/09/2022 FINDINGS: CT HEAD FINDINGS Brain: No evidence of acute infarction, hemorrhage, obstructive hydrocephalus, extra-axial collection or mass lesion/mass effect. Atrophy with ventriculomegaly but there is also some disproportionate subarachnoid spaces and callosal angle narrowing. Chronic bilateral cerebellar infarcts. Chronic left thalamus capsular lacunar infarct. Vascular: No hyperdense vessel or unexpected calcification. Skull: Normal. Negative for fracture or focal lesion. Sinuses/Orbits: No acute finding. CT CERVICAL SPINE FINDINGS Alignment: No traumatic malalignment Skull base and vertebrae: No acute fracture Soft tissues and spinal canal: No prevertebral fluid or swelling. No visible canal hematoma. 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 Disc levels:  Ordinary, generalized degenerative changes. Upper chest: No evidence of injury IMPRESSION: 1. No evidence of acute intracranial or cervical spine injury. 2. Ventriculomegaly with some features seen in normal pressure hydrocephalus. There is also brain atrophy and chronic ischemic injury. Electronically Signed   By: Tiburcio Pea M.D.   On: 10/26/2022 11:35    Procedures Procedures    Medications Ordered in ED Medications - No data to display  ED Course/ Medical Decision Making/ A&P                             Medical Decision Making Amount and/or Complexity of Data Reviewed Labs: ordered. Radiology: ordered.   Mike Mack is here after fall.  Normal vitals.  No fever.  At his baseline.  Sounds like he has memory issues and dementia at baseline.  Will check head CT, neck CT.  He has no extremity tenderness on exam.  Well-appearing.  Will check basic labs.  Not on blood thinners.  Per my review interpretation is no significant anemia or electrolyte abnormality or kidney injury.   Head CT and neck CT per radiology report are unremarkable.  Patient is at his baseline.  Discharged back to the home.  This chart was dictated using voice recognition software.  Despite best efforts to proofread,  errors can occur which can change the documentation meaning.         Final Clinical Impression(s) / ED Diagnoses Final diagnoses:  Fall, initial encounter    Rx / DC Orders ED Discharge Orders     None  Virgina Norfolk, DO 10/26/22 1231

## 2022-11-02 ENCOUNTER — Telehealth: Payer: Self-pay

## 2022-11-02 NOTE — Telephone Encounter (Signed)
Transition Care Management Unsuccessful Follow-up Telephone Call  Date of discharge and from where:  10/26/2022 [facility]  Attempts:  1st Attempt  Reason for unsuccessful TCM follow-up call:  Left voice message  Mike Mack [place]  [facility] Community Resource Care Guide   [name]  ?? [phone]   Website: [email or web address]  [email or web address]      

## 2022-11-06 ENCOUNTER — Telehealth: Payer: Self-pay

## 2022-11-06 NOTE — Telephone Encounter (Signed)
Transition Care Management Unsuccessful Follow-up Telephone Call  Date of discharge and from where:  10/26/2022 [facility]  Attempts:  2nd Attempt  Reason for unsuccessful TCM follow-up call:  Left voice message  Helaine Yackel [place]  [facility] Community Resource Care Guide   [email or web address]  ?? [phone]   Website: [email or web address]  [email or web address]      

## 2023-07-16 ENCOUNTER — Other Ambulatory Visit: Payer: Self-pay

## 2023-08-06 ENCOUNTER — Inpatient Hospital Stay (HOSPITAL_COMMUNITY)
Admission: EM | Admit: 2023-08-06 | Discharge: 2023-08-09 | DRG: 689 | Disposition: A | Discharge status: Home / Self Care | Payer: Medicare HMO | Attending: Internal Medicine | Admitting: Internal Medicine

## 2023-08-06 ENCOUNTER — Encounter (HOSPITAL_COMMUNITY): Payer: Self-pay | Admitting: Emergency Medicine

## 2023-08-06 ENCOUNTER — Emergency Department (HOSPITAL_COMMUNITY): Payer: Medicare Other

## 2023-08-06 ENCOUNTER — Other Ambulatory Visit: Payer: Self-pay

## 2023-08-06 ENCOUNTER — Inpatient Hospital Stay (HOSPITAL_COMMUNITY): Payer: Medicare Other

## 2023-08-06 DIAGNOSIS — G9341 Metabolic encephalopathy: Secondary | ICD-10-CM | POA: Diagnosis present

## 2023-08-06 DIAGNOSIS — Z833 Family history of diabetes mellitus: Secondary | ICD-10-CM

## 2023-08-06 DIAGNOSIS — I5032 Chronic diastolic (congestive) heart failure: Secondary | ICD-10-CM | POA: Diagnosis not present

## 2023-08-06 DIAGNOSIS — Z8673 Personal history of transient ischemic attack (TIA), and cerebral infarction without residual deficits: Secondary | ICD-10-CM | POA: Diagnosis not present

## 2023-08-06 DIAGNOSIS — R4182 Altered mental status, unspecified: Secondary | ICD-10-CM

## 2023-08-06 DIAGNOSIS — Z794 Long term (current) use of insulin: Secondary | ICD-10-CM | POA: Diagnosis not present

## 2023-08-06 DIAGNOSIS — I959 Hypotension, unspecified: Secondary | ICD-10-CM | POA: Diagnosis present

## 2023-08-06 DIAGNOSIS — I6782 Cerebral ischemia: Secondary | ICD-10-CM | POA: Diagnosis not present

## 2023-08-06 DIAGNOSIS — R001 Bradycardia, unspecified: Secondary | ICD-10-CM | POA: Diagnosis not present

## 2023-08-06 DIAGNOSIS — Z8659 Personal history of other mental and behavioral disorders: Secondary | ICD-10-CM | POA: Diagnosis not present

## 2023-08-06 DIAGNOSIS — D696 Thrombocytopenia, unspecified: Secondary | ICD-10-CM | POA: Diagnosis not present

## 2023-08-06 DIAGNOSIS — Z6824 Body mass index (BMI) 24.0-24.9, adult: Secondary | ICD-10-CM | POA: Diagnosis not present

## 2023-08-06 DIAGNOSIS — I950 Idiopathic hypotension: Secondary | ICD-10-CM

## 2023-08-06 DIAGNOSIS — Z8711 Personal history of peptic ulcer disease: Secondary | ICD-10-CM

## 2023-08-06 DIAGNOSIS — R9089 Other abnormal findings on diagnostic imaging of central nervous system: Secondary | ICD-10-CM | POA: Diagnosis not present

## 2023-08-06 DIAGNOSIS — Z8249 Family history of ischemic heart disease and other diseases of the circulatory system: Secondary | ICD-10-CM | POA: Diagnosis not present

## 2023-08-06 DIAGNOSIS — E43 Unspecified severe protein-calorie malnutrition: Secondary | ICD-10-CM | POA: Diagnosis not present

## 2023-08-06 DIAGNOSIS — Z9842 Cataract extraction status, left eye: Secondary | ICD-10-CM

## 2023-08-06 DIAGNOSIS — Z888 Allergy status to other drugs, medicaments and biological substances status: Secondary | ICD-10-CM

## 2023-08-06 DIAGNOSIS — Z79890 Hormone replacement therapy (postmenopausal): Secondary | ICD-10-CM

## 2023-08-06 DIAGNOSIS — R29898 Other symptoms and signs involving the musculoskeletal system: Secondary | ICD-10-CM | POA: Diagnosis present

## 2023-08-06 DIAGNOSIS — Z1152 Encounter for screening for COVID-19: Secondary | ICD-10-CM | POA: Diagnosis not present

## 2023-08-06 DIAGNOSIS — N3000 Acute cystitis without hematuria: Secondary | ICD-10-CM | POA: Diagnosis not present

## 2023-08-06 DIAGNOSIS — A419 Sepsis, unspecified organism: Secondary | ICD-10-CM | POA: Diagnosis not present

## 2023-08-06 DIAGNOSIS — N400 Enlarged prostate without lower urinary tract symptoms: Secondary | ICD-10-CM | POA: Diagnosis not present

## 2023-08-06 DIAGNOSIS — E039 Hypothyroidism, unspecified: Secondary | ICD-10-CM | POA: Diagnosis present

## 2023-08-06 DIAGNOSIS — R29818 Other symptoms and signs involving the nervous system: Secondary | ICD-10-CM | POA: Diagnosis not present

## 2023-08-06 DIAGNOSIS — I130 Hypertensive heart and chronic kidney disease with heart failure and stage 1 through stage 4 chronic kidney disease, or unspecified chronic kidney disease: Secondary | ICD-10-CM | POA: Diagnosis not present

## 2023-08-06 DIAGNOSIS — F0390 Unspecified dementia without behavioral disturbance: Secondary | ICD-10-CM | POA: Diagnosis present

## 2023-08-06 DIAGNOSIS — D631 Anemia in chronic kidney disease: Secondary | ICD-10-CM | POA: Diagnosis not present

## 2023-08-06 DIAGNOSIS — E119 Type 2 diabetes mellitus without complications: Secondary | ICD-10-CM

## 2023-08-06 DIAGNOSIS — N1831 Chronic kidney disease, stage 3a: Secondary | ICD-10-CM | POA: Diagnosis not present

## 2023-08-06 DIAGNOSIS — Z66 Do not resuscitate: Secondary | ICD-10-CM | POA: Diagnosis not present

## 2023-08-06 DIAGNOSIS — Z7401 Bed confinement status: Secondary | ICD-10-CM

## 2023-08-06 DIAGNOSIS — R0689 Other abnormalities of breathing: Secondary | ICD-10-CM | POA: Diagnosis not present

## 2023-08-06 DIAGNOSIS — R000 Tachycardia, unspecified: Secondary | ICD-10-CM | POA: Diagnosis not present

## 2023-08-06 DIAGNOSIS — G9389 Other specified disorders of brain: Secondary | ICD-10-CM | POA: Diagnosis not present

## 2023-08-06 DIAGNOSIS — N179 Acute kidney failure, unspecified: Secondary | ICD-10-CM | POA: Diagnosis not present

## 2023-08-06 DIAGNOSIS — Z886 Allergy status to analgesic agent status: Secondary | ICD-10-CM

## 2023-08-06 DIAGNOSIS — R231 Pallor: Secondary | ICD-10-CM | POA: Diagnosis not present

## 2023-08-06 DIAGNOSIS — G459 Transient cerebral ischemic attack, unspecified: Secondary | ICD-10-CM | POA: Diagnosis not present

## 2023-08-06 DIAGNOSIS — E785 Hyperlipidemia, unspecified: Secondary | ICD-10-CM | POA: Diagnosis present

## 2023-08-06 DIAGNOSIS — Z9841 Cataract extraction status, right eye: Secondary | ICD-10-CM

## 2023-08-06 DIAGNOSIS — Z79899 Other long term (current) drug therapy: Secondary | ICD-10-CM

## 2023-08-06 DIAGNOSIS — Z7982 Long term (current) use of aspirin: Secondary | ICD-10-CM

## 2023-08-06 DIAGNOSIS — Z961 Presence of intraocular lens: Secondary | ICD-10-CM | POA: Diagnosis present

## 2023-08-06 DIAGNOSIS — E1122 Type 2 diabetes mellitus with diabetic chronic kidney disease: Secondary | ICD-10-CM | POA: Diagnosis not present

## 2023-08-06 DIAGNOSIS — R531 Weakness: Secondary | ICD-10-CM | POA: Diagnosis not present

## 2023-08-06 DIAGNOSIS — Z8612 Personal history of poliomyelitis: Secondary | ICD-10-CM | POA: Diagnosis present

## 2023-08-06 LAB — CBC WITH DIFFERENTIAL/PLATELET
Abs Immature Granulocytes: 0.02 10*3/uL (ref 0.00–0.07)
Basophils Absolute: 0 10*3/uL (ref 0.0–0.1)
Basophils Relative: 0 %
Eosinophils Absolute: 0 10*3/uL (ref 0.0–0.5)
Eosinophils Relative: 0 %
HCT: 27.5 % — ABNORMAL LOW (ref 39.0–52.0)
Hemoglobin: 8.8 g/dL — ABNORMAL LOW (ref 13.0–17.0)
Immature Granulocytes: 0 %
Lymphocytes Relative: 16 %
Lymphs Abs: 0.8 10*3/uL (ref 0.7–4.0)
MCH: 32.6 pg (ref 26.0–34.0)
MCHC: 32 g/dL (ref 30.0–36.0)
MCV: 101.9 fL — ABNORMAL HIGH (ref 80.0–100.0)
Monocytes Absolute: 0.2 10*3/uL (ref 0.1–1.0)
Monocytes Relative: 3 %
Neutro Abs: 4.3 10*3/uL (ref 1.7–7.7)
Neutrophils Relative %: 81 %
Platelets: 91 10*3/uL — ABNORMAL LOW (ref 150–400)
RBC: 2.7 MIL/uL — ABNORMAL LOW (ref 4.22–5.81)
RDW: 13.4 % (ref 11.5–15.5)
Smear Review: NORMAL
WBC: 5.4 10*3/uL (ref 4.0–10.5)
nRBC: 0 % (ref 0.0–0.2)

## 2023-08-06 LAB — TSH: TSH: 2.544 u[IU]/mL (ref 0.350–4.500)

## 2023-08-06 LAB — URINALYSIS, W/ REFLEX TO CULTURE (INFECTION SUSPECTED)
Bilirubin Urine: NEGATIVE
Glucose, UA: NEGATIVE mg/dL
Ketones, ur: 5 mg/dL — AB
Nitrite: NEGATIVE
Protein, ur: NEGATIVE mg/dL
Specific Gravity, Urine: 1.01 (ref 1.005–1.030)
pH: 5 (ref 5.0–8.0)

## 2023-08-06 LAB — COMPREHENSIVE METABOLIC PANEL
ALT: 11 U/L (ref 0–44)
AST: 14 U/L — ABNORMAL LOW (ref 15–41)
Albumin: 3.1 g/dL — ABNORMAL LOW (ref 3.5–5.0)
Alkaline Phosphatase: 90 U/L (ref 38–126)
Anion gap: 10 (ref 5–15)
BUN: 15 mg/dL (ref 8–23)
CO2: 22 mmol/L (ref 22–32)
Calcium: 9.5 mg/dL (ref 8.9–10.3)
Chloride: 106 mmol/L (ref 98–111)
Creatinine, Ser: 1.02 mg/dL (ref 0.61–1.24)
GFR, Estimated: >60 mL/min (ref >60)
Glucose, Bld: 142 mg/dL — ABNORMAL HIGH (ref 70–99)
Potassium: 3.9 mmol/L (ref 3.5–5.1)
Sodium: 138 mmol/L (ref 135–145)
Total Bilirubin: 1 mg/dL (ref 0.0–1.2)
Total Protein: 6.5 g/dL (ref 6.5–8.1)

## 2023-08-06 LAB — TROPONIN I (HIGH SENSITIVITY): Troponin I (High Sensitivity): 6 ng/L (ref <18)

## 2023-08-06 LAB — I-STAT CHEM 8, ED
BUN: 14 mg/dL (ref 8–23)
Calcium, Ion: 1.2 mmol/L (ref 1.15–1.40)
Chloride: 110 mmol/L (ref 98–111)
Creatinine, Ser: 1 mg/dL (ref 0.61–1.24)
Glucose, Bld: 134 mg/dL — ABNORMAL HIGH (ref 70–99)
HCT: 36 % — ABNORMAL LOW (ref 39.0–52.0)
Hemoglobin: 12.2 g/dL — ABNORMAL LOW (ref 13.0–17.0)
Potassium: 4 mmol/L (ref 3.5–5.1)
Sodium: 141 mmol/L (ref 135–145)
TCO2: 21 mmol/L — ABNORMAL LOW (ref 22–32)

## 2023-08-06 LAB — MAGNESIUM: Magnesium: 1.6 mg/dL — ABNORMAL LOW (ref 1.7–2.4)

## 2023-08-06 LAB — RESP PANEL BY RT-PCR (RSV, FLU A&B, COVID)  RVPGX2
Influenza A by PCR: NEGATIVE
Influenza B by PCR: NEGATIVE
Resp Syncytial Virus by PCR: NEGATIVE
SARS Coronavirus 2 by RT PCR: NEGATIVE

## 2023-08-06 LAB — PROTIME-INR
INR: 1.2 (ref 0.8–1.2)
Prothrombin Time: 15.1 s (ref 11.4–15.2)

## 2023-08-06 LAB — APTT: aPTT: 29 s (ref 24–36)

## 2023-08-06 LAB — I-STAT CG4 LACTIC ACID, ED
Lactic Acid, Venous: 0.5 mmol/L (ref 0.5–1.9)
Lactic Acid, Venous: 0.9 mmol/L (ref 0.5–1.9)

## 2023-08-06 MED ORDER — ONDANSETRON HCL 4 MG PO TABS: 4.0000 mg | ORAL_TABLET | Freq: Four times a day (QID) | ORAL | Status: DC | PRN

## 2023-08-06 MED ORDER — SODIUM CHLORIDE 0.9 % IV SOLN: 250.0000 mL | INTRAVENOUS | Status: AC | PRN

## 2023-08-06 MED ORDER — SODIUM CHLORIDE 0.9 % IV BOLUS
1000.0000 mL | Freq: Once | INTRAVENOUS | Status: AC
  Administered 2023-08-06: 1000 mL via INTRAVENOUS

## 2023-08-06 MED ORDER — MEMANTINE HCL 10 MG PO TABS
10.0000 mg | ORAL_TABLET | Freq: Two times a day (BID) | ORAL | Status: DC
  Administered 2023-08-06 – 2023-08-09 (×6): 10 mg via ORAL
  Filled 2023-08-06 (×6): qty 1

## 2023-08-06 MED ORDER — ENOXAPARIN SODIUM 40 MG/0.4ML IJ SOSY: 40.0000 mg | PREFILLED_SYRINGE | INTRAMUSCULAR | Status: DC

## 2023-08-06 MED ORDER — ONDANSETRON HCL 4 MG/2ML IJ SOLN
4.0000 mg | Freq: Four times a day (QID) | INTRAMUSCULAR | Status: DC | PRN
Start: 2023-08-06 — End: 2023-08-09

## 2023-08-06 MED ORDER — SODIUM CHLORIDE 0.9 % IV SOLN
1.0000 g | Freq: Once | INTRAVENOUS | Status: AC
  Administered 2023-08-06: 1 g via INTRAVENOUS
  Filled 2023-08-06: qty 10

## 2023-08-06 MED ORDER — PANTOPRAZOLE SODIUM 40 MG PO TBEC
40.0000 mg | DELAYED_RELEASE_TABLET | Freq: Every day | ORAL | Status: DC
  Administered 2023-08-07 – 2023-08-09 (×3): 40 mg via ORAL
  Filled 2023-08-06 (×3): qty 1

## 2023-08-06 MED ORDER — LEVOTHYROXINE SODIUM 50 MCG PO TABS
50.0000 ug | ORAL_TABLET | Freq: Every day | ORAL | Status: DC
  Administered 2023-08-07 – 2023-08-09 (×3): 50 ug via ORAL
  Filled 2023-08-06: qty 2
  Filled 2023-08-06 (×2): qty 1

## 2023-08-06 MED ORDER — ACETAMINOPHEN 650 MG RE SUPP: 650.0000 mg | Freq: Four times a day (QID) | RECTAL | Status: DC | PRN

## 2023-08-06 MED ORDER — SODIUM CHLORIDE 0.9% FLUSH: 3.0000 mL | INTRAVENOUS | Status: DC | PRN

## 2023-08-06 MED ORDER — TAMSULOSIN HCL 0.4 MG PO CAPS
0.4000 mg | ORAL_CAPSULE | Freq: Every day | ORAL | Status: DC
  Administered 2023-08-07 – 2023-08-09 (×3): 0.4 mg via ORAL
  Filled 2023-08-06 (×3): qty 1

## 2023-08-06 MED ORDER — ATORVASTATIN CALCIUM 10 MG PO TABS
20.0000 mg | ORAL_TABLET | Freq: Every evening | ORAL | Status: DC
  Administered 2023-08-06 – 2023-08-08 (×3): 20 mg via ORAL
  Filled 2023-08-06 (×2): qty 2
  Filled 2023-08-06: qty 1

## 2023-08-06 MED ORDER — PIPERACILLIN-TAZOBACTAM 3.375 G IVPB
3.3750 g | Freq: Three times a day (TID) | INTRAVENOUS | Status: DC
  Administered 2023-08-06 – 2023-08-08 (×5): 3.375 g via INTRAVENOUS
  Filled 2023-08-06 (×5): qty 50

## 2023-08-06 MED ORDER — ASPIRIN 81 MG PO TBEC
81.0000 mg | DELAYED_RELEASE_TABLET | Freq: Every day | ORAL | Status: DC
  Administered 2023-08-07 – 2023-08-09 (×3): 81 mg via ORAL
  Filled 2023-08-06 (×3): qty 1

## 2023-08-06 MED ORDER — SODIUM CHLORIDE 0.9% FLUSH
3.0000 mL | Freq: Two times a day (BID) | INTRAVENOUS | Status: DC
  Administered 2023-08-06 – 2023-08-09 (×4): 3 mL via INTRAVENOUS

## 2023-08-06 MED ORDER — SODIUM CHLORIDE 0.9 % IV SOLN
1.0000 g | INTRAVENOUS | Status: DC
Start: 2023-08-06 — End: 2023-08-06

## 2023-08-06 MED ORDER — ACETAMINOPHEN 325 MG PO TABS
650.0000 mg | ORAL_TABLET | Freq: Four times a day (QID) | ORAL | Status: DC | PRN
  Administered 2023-08-07 – 2023-08-09 (×3): 650 mg via ORAL
  Filled 2023-08-06 (×3): qty 2

## 2023-08-06 MED ORDER — AMLODIPINE BESYLATE 5 MG PO TABS
2.5000 mg | ORAL_TABLET | Freq: Every day | ORAL | Status: DC
  Administered 2023-08-07 – 2023-08-09 (×3): 2.5 mg via ORAL
  Filled 2023-08-06 (×3): qty 1

## 2023-08-06 NOTE — ED Provider Notes (Signed)
  Physical Exam  BP 138/82   Pulse 93   Temp 98.6 F (37 C) (Oral)   Resp 10   SpO2 100%   Physical Exam  Procedures  Procedures  ED Course / MDM    Medical Decision Making Care assumed at 3 PM.  Patient is here with altered mental status.  Patient had UTI previously.  Signout pending labs and admission  9:53 PM Patient is a difficult stick and I was able to get ultrasound IV on him.  Patient's white blood cell count is normal.  Lactate is normal.  Patient's UA showed UTI.  CT head showed no obvious bleed.  At this point patient is still confused so will be admitted for AMS secondary to UTI   CRITICAL CARE Performed by: Richardean Canal   Total critical care time: 38 minutes  Critical care time was exclusive of separately billable procedures and treating other patients.  Critical care was necessary to treat or prevent imminent or life-threatening deterioration.  Critical care was time spent personally by me on the following activities: development of treatment plan with patient and/or surrogate as well as nursing, discussions with consultants, evaluation of patient's response to treatment, examination of patient, obtaining history from patient or surrogate, ordering and performing treatments and interventions, ordering and review of laboratory studies, ordering and review of radiographic studies, pulse oximetry and re-evaluation of patient's condition.   Problems Addressed: Acute cystitis without hematuria: acute illness or injury Altered mental status, unspecified altered mental status type: acute illness or injury  Amount and/or Complexity of Data Reviewed Labs: ordered. Decision-making details documented in ED Course. Radiology: ordered and independent interpretation performed. Decision-making details documented in ED Course. ECG/medicine tests: ordered and independent interpretation performed. Decision-making details documented in ED Course.         Charlynne Pander,  MD 08/06/23 2154

## 2023-08-06 NOTE — ED Triage Notes (Signed)
 Ems from home with family c/o AMS, Pt less than baseline.  Unable to open eyes, not responding to verbal stimuli.  EMS found pt in bradycardia upon arrival and administered 1 of atropine which increased HR from 46 to 86. Bgl 186 Upon arrival pt in ST at 103, and opens eyes to verbal stimuli, A&Ox3, GCS 14. Wife at bedside states, "still sleepy but better than he was"

## 2023-08-06 NOTE — Progress Notes (Signed)
 Pharmacy Antibiotic Note  Mike Mack is a 88 y.o. male admitted on 08/06/2023 with {Indications:3041527}.  Pharmacy has been consulted for *** dosing.  Plan: {Assessment:21075}     Temp (24hrs), Avg:98.6 F (37 C), Min:98.6 F (37 C), Max:98.6 F (37 C)  Recent Labs  Lab 08/06/23 1717 08/06/23 1726 08/06/23 1857  WBC 5.4  --   --   CREATININE  --   --  1.02  1.00  LATICACIDVEN  --  0.5 0.9    CrCl cannot be calculated (Unknown ideal weight.).    Allergies  Allergen Reactions   Insulin Glargine    Insulin Glargine-Lixisenatide Other (See Comments) and Nausea And Vomiting    Stomach cramps   Metformin And Related Nausea And Vomiting and Other (See Comments)    Gi intolerance   Nsaids Other (See Comments)    Renal insufficiency    Antimicrobials this admission: *** *** >> *** *** *** >> ***  Dose adjustments this admission: ***  Microbiology results: *** BCx: *** *** UCx: ***  *** Sputum: ***  *** MRSA PCR: ***  Thank you for allowing pharmacy to be a part of this patient's care.  Marja Kays 08/06/2023 10:14 PM

## 2023-08-06 NOTE — ED Notes (Signed)
 Per Dr. Silverio Lay, will wait until fluid bolus complete before straight cath attempt, as pt is very dry.

## 2023-08-06 NOTE — H&P (Signed)
 History and Physical    Alrick Cubbage WUJ:[id number] DOB: 22-Aug-1935 DOA: 08/06/2023  PCP: Georgianne Fick, MD   Patient coming from: Home   Chief Complaint:  Chief Complaint  Patient presents with   AMS    Bradycardia   ED TRIAGE note:  HPI:  Mike Mack is a 88 y.o. male with medical history significant of essential hypertension, dementia, BPH, insulin-dependent DM type II, history of CVA, hypothyroidism, history of polio with right-sided lower extremity weakness, right-sided lower extremity chronic fracture and bed Mack at baseline.  Patient was brought to ED via EMS after being found less responsive this morning 2/17.  Paramedics found patient was hypotensive bradycardic heart rate up to 45.  He was given 500 mL of NS bolus and 1 mg atropine afterward systolic blood pressure improved 80-120 and heart rate improved to 90.  Patient is unable to provide any information and when they first found the patient was not able to talk.  Patient was completely unresponsive although he was maintaining his airway.  No family member available over the phone. Evidently at the baseline patient is able to walk and answer basic question.  Patient denies any headache, blurry vision, nausea, vomiting, abdominal pain, constipation or diarrhea.  Reported increase Neri urgency and frequency.  Denies any fever and chill and lower abdominal pain.  Patient is pleasantly confused.  At presentation to ED patient heart rate in between 85-93 also blood pressure is has been improved to 157/94.  Respiratory rate 10-12 and O2 sat 100% room air.  EKG showing sinus tachycardia heart rate 106.  There is no ST anterior abnormality.  Borderline repolarization abnormality.  Blood cultures are in process. CBC showing no leukocytosis, hemoglobin dropped to 8.8 (baseline hemoglobin around 9-12 almost 10 months ago) and low platelet count 92. Normal lactic acid level. Respiratory panel negative for COVID, flu  RSV. Normal pro time INR. CMP unremarkable except low albumin 3.1. Normal mag level 1.6. Troponin 6 within normal range. Normal TSH level 2.554. UA showed dipstick hemoglobin positive, ketone positive, rare bacteria and leukocyte esterase.  Pending urine culture.  CT head no acute intracranial abnormality.  Old small vessel infarction of the cerebrum and left thalamus.  Chest x-ray no active disease process.  In the ED patient has been given 1 L of NS bolus and ceftriaxone 1 g.  Since patient in the emergency department no evidence of hypotension or bradycardia.  However patient continues to have altered mentation concern in the setting of a UTI.  Patient is unable to provide any history of urinary tract symptom however he has previous hospital admission with similar picture which included sepsis and acute metabolic encephalopathy from UTI.  Hospitalist has been contacted for further evaluation and management of acute metabolic encephalopathy in the setting of acute cystitis, hypotension, bradycardia, thrombocytopenia.  Significant labs in the ED: Lab Orders         Blood Culture (routine x 2)         Resp panel by RT-PCR (RSV, Flu A&B, Covid) Anterior Nasal Swab         Culture, blood (Routine X 2) w Reflex to ID Panel         Urine Culture         CBC with Differential         Urinalysis, w/ Reflex to Culture (Infection Suspected) -Urine, Clean Catch         Protime-INR         APTT  Comprehensive metabolic panel         Magnesium         TSH         Vitamin B12         Ammonia         Comprehensive metabolic panel         CBC         Cortisol-am, blood         Occult blood card to lab, stool         Folate         Iron and TIBC         Ferritin         Reticulocytes         I-Stat Lactic Acid, ED         I-stat chem 8, ED (not at [facility], [facility] or [facility])         I-stat chem 8, ED (not at [facility], [facility] or [facility])       Review of Systems:  Review of Systems  Constitutional:   Negative for chills, fever, malaise/fatigue and weight loss.  Eyes:  Negative for blurred vision and double vision.  Respiratory:  Negative for cough and sputum production.   Cardiovascular:  Negative for chest pain, palpitations, orthopnea and leg swelling.  Gastrointestinal:  Negative for abdominal pain, heartburn, nausea and vomiting.  Genitourinary:  Positive for dysuria, frequency and urgency. Negative for flank pain and hematuria.  Musculoskeletal:  Negative for back pain, falls, joint pain, myalgias and neck pain.  Neurological:  Negative for dizziness and headaches.  Psychiatric/Behavioral:  The patient is not nervous/anxious.   All other systems reviewed and are negative.   Past Medical History:  Diagnosis Date   Acute metabolic encephalopathy 08/09/2022   Anemia 05/13/2013   Arthritis    "right leg" (07/25/2013)   BPH (benign prostatic hyperplasia)    Cholecystitis, acute 05/24/2013   Constipation 05/13/2013   Exertional shortness of breath    "sometimes" (07/25/2013)   History of kidney stones    History of stomach ulcers    Hypertension    Hypothermia 04/01/2022   Neuropathy    Polio    Polio Childhood   Small bowel obstruction (HCC)    Stroke (HCC) 2014   residual:  "left hand kind of numb" (07/25/2013)   Type II diabetes mellitus (HCC)     Past Surgical History:  Procedure Laterality Date   BIOPSY  11/30/2020   Procedure: BIOPSY;  Surgeon: Kerin Salen, MD;  Location: [facility] ENDOSCOPY;  Service: Gastroenterology;;   BIOPSY  03/03/2022   Procedure: BIOPSY;  Surgeon: Vida Rigger, MD;  Location: [facility] ENDOSCOPY;  Service: Gastroenterology;;   BOWEL RESECTION     CATARACT EXTRACTION W/ INTRAOCULAR LENS  IMPLANT, BILATERAL Bilateral 2000's   CHOLECYSTECTOMY N/A 07/25/2013   Procedure: LAPAROSCOPIC CHOLECYSTECTOMY WITH INTRAOPERATIVE CHOLANGIOGRAM;  Surgeon: Ernestene Mention, MD;  Location: [facility] OR;  Service: General;  Laterality: N/A;   COLON SURGERY     COLONOSCOPY     Hx: of    CYSTOSCOPY WITH BIOPSY N/A 12/01/2019   Procedure: CYSTOSCOPY WITH BLADDER BIOPSY AND FULGURATION;  Surgeon: Crista Elliot, MD;  Location: [facility] ORS;  Service: Urology;  Laterality: N/A;   ESOPHAGOGASTRODUODENOSCOPY N/A 11/30/2020   Procedure: ESOPHAGOGASTRODUODENOSCOPY (EGD);  Surgeon: Kerin Salen, MD;  Location: [facility] ENDOSCOPY;  Service: Gastroenterology;  Laterality: N/A;   ESOPHAGOGASTRODUODENOSCOPY N/A 03/03/2022   Procedure: ESOPHAGOGASTRODUODENOSCOPY (EGD);  Surgeon: Vida Rigger, MD;  Location:  [facility] ENDOSCOPY;  Service: Gastroenterology;  Laterality: N/A;   EXCISIONAL HEMORRHOIDECTOMY  2000's   EXTRACORPOREAL SHOCK WAVE LITHOTRIPSY Right 10/03/2018   Procedure: EXTRACORPOREAL SHOCK WAVE LITHOTRIPSY (ESWL);  Surgeon: Jerilee Field, MD;  Location: [facility] ORS;  Service: Urology;  Laterality: Right;   EYE SURGERY     INGUINAL HERNIA REPAIR Right 1970's   IR RADIOLOGIST EVAL & MGMT  04/06/2020   LAPAROSCOPIC CHOLECYSTECTOMY  07/25/2013   w/LOA (07/25/2013)     reports that he has never smoked. He has never used smokeless tobacco. He reports that he does not drink alcohol and does not use drugs.  Allergies  Allergen Reactions   Insulin Glargine    Insulin Glargine-Lixisenatide Other (See Comments) and Nausea And Vomiting    Stomach cramps   Metformin And Related Nausea And Vomiting and Other (See Comments)    Gi intolerance   Nsaids Other (See Comments)    Renal insufficiency    Family History  Problem Relation Age of Onset   Heart failure Mother    Hypertension Sister    Diabetes Sister    Diabetes Brother    Hypertension Brother     Prior to Admission medications   Medication Sig Start Date End Date Taking? Authorizing Provider  acetaminophen (TYLENOL) 325 MG tablet Take 2 tablets (650 mg total) by mouth every 6 (six) hours as needed for mild pain (or Fever >/= 101). 11/27/20  Yes Calvert Cantor, MD  amLODipine (NORVASC) 2.5 MG tablet Take 1 tablet (2.5 mg total) by mouth daily.  08/11/22  Yes Azucena Fallen, MD  aspirin EC 81 MG tablet Take 1 tablet (81 mg total) by mouth daily. Swallow whole. 04/06/22  Yes Azucena Fallen, MD  atorvastatin (LIPITOR) 20 MG tablet Take 20 mg by mouth every evening.  11/03/13  Yes [provider]  B Complex Vitamins (B COMPLEX PO) Take 1 tablet by mouth at bedtime.   Yes [provider]  brimonidine (ALPHAGAN) 0.2 % ophthalmic solution Place 1 drop into both eyes 2 (two) times daily.   Yes [provider]  cholecalciferol (VITAMIN D3) 10 MCG (400 UNIT) TABS tablet Take 400 Units by mouth at bedtime.   Yes [provider]  dorzolamide-timolol (COSOPT) 22.3-6.8 MG/ML ophthalmic solution Place 1 drop into both eyes 2 (two) times daily. 03/04/20  Yes [provider]  Finerenone (KERENDIA) 10 MG TABS Take 10 mg by mouth daily.   Yes [provider]  latanoprost (XALATAN) 0.005 % ophthalmic solution Place 1 drop into both eyes at bedtime.   Yes [provider]  levothyroxine (SYNTHROID) 50 MCG tablet Take 1 tablet (50 mcg total) by mouth daily at 6 (six) AM. 10/16/22  Yes Brittian Sea, MD  memantine (NAMENDA) 10 MG tablet Take 10 mg by mouth in the morning and at bedtime. 11/04/20  Yes [provider]  pantoprazole (PROTONIX) 40 MG tablet Take 1 tablet (40 mg total) by mouth daily. 07/08/22  Yes Sherian Maroon A, PA  polyethylene glycol (MIRALAX / GLYCOLAX) 17 g packet Take 17 g by mouth daily. 09/05/22  Yes Rodolph Bong, MD  RHOPRESSA 0.02 % SOLN Place 1 drop into both eyes at bedtime. 03/05/20  Yes [provider]  Tamsulosin HCl (FLOMAX) 0.4 MG CAPS Take 0.4 mg by mouth at bedtime.   Yes [provider]  QUEtiapine (SEROQUEL) 25 MG tablet Take 25 mg by mouth daily. Patient not taking: Reported on 08/06/2023 07/03/23   [provider]  Physical Exam: Vitals:   08/06/23 2330 08/07/23 0000 08/07/23 0100 08/07/23 0135  BP: (!)  146/91 131/86 (!) 147/85   Pulse: 88 88 78   Resp:  (!) 22 15   Temp:    98.8 F (37.1 C)  TempSrc:      SpO2: 100% 100% 100%   Weight:      Height:        Physical Exam Vitals and nursing note reviewed.  Constitutional:      Appearance: He is ill-appearing.     Comments: Cachectic appearance and generalized muscle wasting.  HENT:     Head: Normocephalic and atraumatic.     Mouth/Throat:     Mouth: Mucous membranes are moist.  Eyes:     Conjunctiva/sclera: Conjunctivae normal.  Cardiovascular:     Rate and Rhythm: Normal rate and regular rhythm.     Pulses: Normal pulses.     Heart sounds: Normal heart sounds.  Pulmonary:     Effort: Pulmonary effort is normal.     Breath sounds: Normal breath sounds.  Abdominal:     General: Bowel sounds are normal.     Tenderness: There is no abdominal tenderness.  Musculoskeletal:     Cervical back: Normal range of motion and neck supple.     Right lower leg: No edema.     Left lower leg: No edema.  Skin:    General: Skin is dry.  Neurological:     Mental Status: He is alert.     Comments: Patient is alert and oriented to name Mike Mack.  Psychiatric:        Mood and Affect: Mood normal.      Labs on Admission: I have personally reviewed following labs and imaging studies  CBC: Recent Labs  Lab 08/06/23 1717 08/06/23 1857  WBC 5.4  --   NEUTROABS 4.3  --   HGB 8.8* 12.2*  HCT 27.5* 36.0*  MCV 101.9*  --   PLT 91*  --    Basic Metabolic Panel: Recent Labs  Lab 08/06/23 1857  NA 138  141  K 3.9  4.0  CL 106  110  CO2 22  GLUCOSE 142*  134*  BUN 15  14  CREATININE 1.02  1.00  CALCIUM 9.5  MG 1.6*   GFR: Estimated Creatinine Clearance: 47 mL/min (by C-G formula based on SCr of 1 mg/dL). Liver Function Tests: Recent Labs  Lab 08/06/23 1857  AST 14*  ALT 11  ALKPHOS 90  BILITOT 1.0  PROT 6.5  ALBUMIN 3.1*   No results for input(s): "LIPASE", "AMYLASE" in the last 168 hours. No results for  input(s): "AMMONIA" in the last 168 hours. Coagulation Profile: Recent Labs  Lab 08/06/23 1857  INR 1.2   Cardiac Enzymes: Recent Labs  Lab 08/06/23 1857  TROPONINIHS 6   BNP (last 3 results) Recent Labs    10/12/22 0437  BNP 16.8   HbA1C: No results for input(s): "HGBA1C" in the last 72 hours. CBG: No results for input(s): "GLUCAP" in the last 168 hours. Lipid Profile: No results for input(s): "CHOL", "HDL", "LDLCALC", "TRIG", "CHOLHDL", "LDLDIRECT" in the last 72 hours. Thyroid Function Tests: Recent Labs    08/06/23 1857  TSH 2.544   Anemia Panel: No results for input(s): "VITAMINB12", "FOLATE", "FERRITIN", "TIBC", "IRON", "RETICCTPCT" in the last 72 hours. Urine analysis:    Component Value Date/Time   COLORURINE YELLOW 08/06/2023 1944   APPEARANCEUR CLEAR 08/06/2023 1944   LABSPEC 1.010 08/06/2023 1944  PHURINE 5.0 08/06/2023 1944   GLUCOSEU NEGATIVE 08/06/2023 1944   HGBUR MODERATE (A) 08/06/2023 1944   BILIRUBINUR NEGATIVE 08/06/2023 1944   KETONESUR 5 (A) 08/06/2023 1944   PROTEINUR NEGATIVE 08/06/2023 1944   UROBILINOGEN 0.2 10/25/2014 1520   NITRITE NEGATIVE 08/06/2023 1944   LEUKOCYTESUR SMALL (A) 08/06/2023 1944    Radiological Exams on Admission: I have personally reviewed images MR BRAIN WO CONTRAST Result Date: 08/06/2023 CLINICAL DATA:  Mental status change of unknown cause EXAM: MRI HEAD WITHOUT CONTRAST TECHNIQUE: Multiplanar, multiecho pulse sequences of the brain and surrounding structures were obtained without intravenous contrast. COMPARISON:  Head CT same day.  MRI 10/10/2022 FINDINGS: Brain: Diffusion imaging does not show any acute or subacute infarction or other cause of restricted diffusion. Mild chronic small-vessel ischemic change affects the pons. There are numerous old small vessel cerebellar infarctions. Cerebral hemispheres show central volume loss with ventriculomegaly. There is an old infarction in the left thalamus. There are  moderate chronic small-vessel ischemic changes of the white matter. No large vessel territory stroke is seen. No mass, hemorrhage, hydrocephalus or extra-axial collection. Vascular: Major vessels at the base of the brain show flow. Skull and upper cervical spine: Negative Sinuses/Orbits: Paranasal sinuses are clear.  Orbits negative. Other: Small bilateral mastoid effusions. IMPRESSION: 1. No acute finding by MRI. Chronic small-vessel ischemic changes of the pons, cerebellum and cerebral hemispheric white matter. Old left thalamic infarction. Central volume loss with ventriculomegaly. 2. Small bilateral mastoid effusions. Electronically Signed   By: Paulina Fusi M.D.   On: 08/06/2023 23:24   DG Chest Port 1 View Result Date: 08/06/2023 CLINICAL DATA:  Sepsis presentation. EXAM: PORTABLE CHEST 1 VIEW COMPARISON:  10/09/2022 FINDINGS: The heart size and mediastinal contours are within normal limits. Both lungs are clear. The visualized skeletal structures are unremarkable. IMPRESSION: No active disease. Electronically Signed   By: Paulina Fusi M.D.   On: 08/06/2023 15:59   CT Head Wo Contrast Result Date: 08/06/2023 CLINICAL DATA:  Neuro deficit, acute, stroke suspected EXAM: CT HEAD WITHOUT CONTRAST TECHNIQUE: Contiguous axial images were obtained from the base of the skull through the vertex without intravenous contrast. RADIATION DOSE REDUCTION: This exam was performed according to the departmental dose-optimization program which includes automated exposure control, adjustment of the mA and/or kV according to patient size and/or use of iterative reconstruction technique. COMPARISON:  10/26/2022 FINDINGS: Brain: The study suffers from considerable artifact. Old small vessel infarctions affect the cerebellum. Cerebral hemispheres show chronic ventriculomegaly with chronic small-vessel ischemic changes of the white matter. Previous infarction of the left thalamus. Chronic ventriculomegaly which appears the same  as seen previously. This could be ex vacuo enlargement, but normal pressure hydrocephalus is not excluded. No evidence of hemorrhage or extra-axial collection. Vascular: There is atherosclerotic calcification of the major vessels at the base of the brain. Skull: Negative Sinuses/Orbits: Clear/normal Other: None IMPRESSION: No acute CT finding. Old small vessel infarctions of the cerebellum and left thalamus. Chronic small-vessel ischemic changes of the white matter. Chronic ventriculomegaly which could be ex vacuo enlargement, but normal pressure hydrocephalus is not excluded. Electronically Signed   By: Paulina Fusi M.D.   On: 08/06/2023 15:59     EKG: My personal interpretation of EKG shows: Sinus tach cardia heart rate 104.  There is no ST anterior abnormality.    Assessment/Plan: Active Problems:   Hypotension   Acute metabolic encephalopathy   Chronic diastolic CHF (congestive heart failure) (HCC)   Sinus bradycardia-resolved   CKD stage  3a, GFR 45-59 ml/min (HCC)   BPH (benign prostatic hyperplasia)   History of CVA (cerebrovascular accident)   History of poliomyelitis   Insulin dependent type 2 diabetes mellitus (HCC)   Chronic weakness of right lower extremity   History of dementia   Thrombocytopenia (HCC)    Assessment and Plan: Acute metabolic encephalopathy-in the setting of UTI -Patient presented to emergency department via EMS as a family called EMS as they found that patient has more confusion than usual of his baseline dementia.  EMS found patient is bradycardic heart rate 45 and hypotensive systolic blood pressure 86.  After giving atropine 5 mg patient heart rate improved to 103 and systolic blood pressure improved to 120s range. Family reported that patient did not had any fall and injury. - Since presentation to ED patient is hemodynamically stable heart rate between 85-106 and blood pressure be between 136 77-1 43/85.  O2 sat 100% room air.  Respiratory in between  1522. - CBC no evidence of leukocytosis however it showed hemoglobin has been dropped 12.5-8.8 over the course of 8 months.  Low platelet count 91 and baseline platelet count is around 300. -Respiratory panel negative. - UA showed leukocyte esterase positive and rare bacteria present.  Patient is also symptomatic. -EKG showing normal sinus rhythm heart rate 106.  There is no history of abnormality - Chest x-ray no acute disease process - CT head evidence of old infraction. - Obtain MRI no acute intracranial finding.  Chronic ischemic vessel change. -Checking ammonia, vitamin B12 level.  TSH within normal range - Concern for acute metabolic encephalopathy in the setting of acute cystitis. - Continue to monitor mentation, continue fall precaution and delirium precaution.  Acute cystitis - Previous urine culture from March 2024 showed Klebsiella pneumoniae only sensitive to gentamicin, imipenem and Zosyn. -If UA showed evidence of UTI.  Patient is complaining about increased urinary urgency and hesitancy. - Pending repeat urine culture - Continue Zosyn pharmacy consult. -Based on the urine culture result can de-escalate antibiotic.  Sinus bradycardia-resolved -EMS reported that patient found symptomatic bradycardic heart rate dropped to 45 and blood pressure was 86.  Since patient in the ED heart rate has been improved and currently tachycardic around upper 90 to 110.  Blood pressure also has been improved. -Normal TSH level. - Checking morning cortisol level. -Checking echocardiogram. -Continue cardiac monitoring overnight.   Hypotension-resolved Patient has history of hypertension and diastolic heart failure at baseline.  Blood pressure currently in the higher side after giving the atropine. -TSH within normal range.  Checking morning cortisol level. -Checking echocardiogram.  Thrombocytopenia-unknown etiology -Platelet count has been dropped to 91.  Baseline platelet count is around  309 months ago. Unclear etiology of development of thrombocytopenia. Continue to monitor platelet count. - On discharge need to refer to hematology to establish care.  Acute on chronic anemia - Hemoglobin dropped to 8 baseline around 9-11 around 8 months ago. - Checking anemia panel.  Checking FOBT. Holding DVT pharmacological prophylaxis until FOBT rules out any concern for GI bleed. -Continue to monitor H&H  Essential hypertension History of chronic diastolic heart failure preserved EF 60 to 65% - Per chart review echo from 04/07/2022 showed grade 1 diastolic heart failure with preserved EF 65% - Currently patient blood pressure within the higher range and hypotension has been completely resolved. -Resume home amlodipine 2.5 mg.   CKD stage III -Creatinine 1.01.  Renal function at baseline. -Holding Finerenone.  As I am not clear if patient has  been really taking this or not.  Waiting for pharmacy verification. -Avoid nephrotoxic agent.  History of CVA -Continue aspirin 1 mg daily  History of BPH -Continue Flomax  Insulin-dependent DM type II - Patient at home not on insulin regimen also per chart review found out that patient has history of allergic reaction to insulin in the past.? -Continue carb modified heart healthy diet  History of dementia -Continue memantine  Physical deconditioning Age-related debility - Physical exam showing patient has significant protein calorie malnutrition and generalized muscle wasting of upper lower extremities. - Consulting inpatient dietitian for calorie requirement and advice.    DVT prophylaxis:  SCDs.  Deferring pharmacological DVT prophylaxis until FOBT will be ruling out GI bleed. Code Status:  Full Code-by default Diet: Heart healthy and carb modified Family Communication: No family member at bedside now Disposition Plan: Pending urine culture result for appropriate antibiotic guidance.  Pending cortisol level. Consults:  Dietitian Admission status:   Inpatient, Telemetry bed  Severity of Illness: The appropriate patient status for this patient is INPATIENT. Inpatient status is judged to be reasonable and necessary in order to provide the required intensity of service to ensure the patient's safety. The patient's presenting symptoms, physical exam findings, and initial radiographic and laboratory data in the context of their chronic comorbidities is felt to place them at high risk for further clinical deterioration. Furthermore, it is not anticipated that the patient will be medically stable for discharge from the hospital within 2 midnights of admission.   * I certify that at the point of admission it is my clinical judgment that the patient will require inpatient hospital care spanning beyond 2 midnights from the point of admission due to high intensity of service, high risk for further deterioration and high frequency of surveillance required.Marland Kitchen    Tereasa Coop, MD [facility]  How to contact the [facility] Attending or Consulting provider 7A - 7P or covering provider during after hours 7P -7A, for this patient.  Check the care team in [facility] and look for a) attending/consulting [facility] provider listed and b) the [facility] team listed Log into [email or web address] and use [place]'s universal password to access. If you do not have the password, please contact the hospital operator. Locate the [facility] provider you are looking for under [facility] and page to a number that you can be directly reached. If you still have difficulty reaching the provider, please page the [facility] (Director on Call) for the Hospitalists listed on amion for assistance.  08/07/2023, 4:15 AM

## 2023-08-06 NOTE — ED Provider Notes (Signed)
 [place] [facility]MENT AT [facility] Provider Note   CSN: [id number] Arrival date & time: 08/06/23  1359     History {Add pertinent medical, surgical, social history, OB history to HPI:1} No chief complaint on file.   Mike Mack is a 88 y.o. male.  HPI   This patient is an 88 year old male, he presents from home by EMS after being found to be less responsive this morning, the paramedics found him to be hypotensive, bradycardic to approximately 45 bpm, they gave 500 cc of fluid and 1 mg of atropine and his blood pressure went from 80 up to 140 and his heart rate went up to 90.  The patient is not able to give me any information, when they first found of the patient was not able to talk, he was completely unresponsive although he was breathing by himself.  There was no other report family is not available by phone and the paramedics deny having any fevers.  Evidently the patient's baseline is that he is able to walk and answer basic questions.  He does have a history of dementia and hypertension for which she takes amlodipine and Namenda as well as tamsulosin for prostatic hypertrophy.  Home Medications Prior to Admission medications   Medication Sig Start Date End Date Taking? Authorizing Provider  acetaminophen (TYLENOL) 325 MG tablet Take 2 tablets (650 mg total) by mouth every 6 (six) hours as needed for mild pain (or Fever >/= 101). 11/27/20   Calvert Cantor, MD  amLODipine (NORVASC) 2.5 MG tablet Take 1 tablet (2.5 mg total) by mouth daily. 08/11/22   Azucena Fallen, MD  aspirin EC 81 MG tablet Take 1 tablet (81 mg total) by mouth daily. Swallow whole. 04/06/22   Azucena Fallen, MD  atorvastatin (LIPITOR) 20 MG tablet Take 20 mg by mouth every evening.  11/03/13   [provider]  B Complex Vitamins (B COMPLEX PO) Take 1 tablet by mouth at bedtime.    [provider]  brimonidine (ALPHAGAN) 0.2 % ophthalmic solution Place 1 drop into  both eyes 3 (three) times daily.    [provider]  cholecalciferol (VITAMIN D3) 10 MCG (400 UNIT) TABS tablet Take 400 Units by mouth at bedtime.    [provider]  dorzolamide-timolol (COSOPT) 22.3-6.8 MG/ML ophthalmic solution Place 1 drop into both eyes 2 (two) times daily. 03/04/20   [provider]  Finerenone (KERENDIA) 10 MG TABS Take 10 mg by mouth daily.    [provider]  latanoprost (XALATAN) 0.005 % ophthalmic solution Place 1 drop into both eyes at bedtime.    [provider]  levothyroxine (SYNTHROID) 50 MCG tablet Take 1 tablet (50 mcg total) by mouth daily at 6 (six) AM. 10/16/22   Brandin Sea, MD  memantine (NAMENDA) 10 MG tablet Take 10 mg by mouth in the morning and at bedtime. 11/04/20   [provider]  pantoprazole (PROTONIX) 40 MG tablet Take 1 tablet (40 mg total) by mouth daily. 07/08/22   Sherian Maroon A, PA  polyethylene glycol (MIRALAX / GLYCOLAX) 17 g packet Take 17 g by mouth daily. 09/05/22   Rodolph Bong, MD  RHOPRESSA 0.02 % SOLN Place 1 drop into both eyes at bedtime. 03/05/20   [provider]  senna-docusate (SENOKOT-S) 8.6-50 MG tablet Take 1 tablet by mouth 2 (two) times daily. Patient taking differently: Take 1 tablet by mouth in the morning. 09/05/22   Rodolph Bong, MD  Tamsulosin HCl (FLOMAX) 0.4 MG CAPS Take 0.4 mg by mouth at bedtime.    [provider]      Allergies    Insulin glargine, Insulin glargine-lixisenatide, Metformin and related, and Nsaids    Review of Systems   Review of Systems  All other systems reviewed and are negative.   Physical Exam Updated Vital Signs There were no vitals taken for this visit. Physical Exam Vitals and nursing note reviewed.  Constitutional:      Appearance: He is well-developed. He is ill-appearing.     Comments: Somnolent  HENT:     Head: Normocephalic and atraumatic.     Nose: No congestion or rhinorrhea.      Mouth/Throat:     Pharynx: No oropharyngeal exudate.  Eyes:     General: No scleral icterus.       Right eye: No discharge.        Left eye: No discharge.     Conjunctiva/sclera: Conjunctivae normal.     Pupils: Pupils are equal, round, and reactive to light.  Neck:     Thyroid: No thyromegaly.     Vascular: No JVD.  Cardiovascular:     Rate and Rhythm: Normal rate and regular rhythm.     Heart sounds: Normal heart sounds. No murmur heard.    No friction rub. No gallop.  Pulmonary:     Effort: Pulmonary effort is normal. No respiratory distress.     Breath sounds: Normal breath sounds. No wheezing or rales.  Abdominal:     General: Bowel sounds are normal. There is no distension.     Palpations: Abdomen is soft. There is no mass.     Tenderness: There is no abdominal tenderness.  Musculoskeletal:        General: No tenderness. Normal range of motion.     Cervical back: Normal range of motion and neck supple.     Right lower leg: No edema.     Left lower leg: No edema.     Comments: Edema of the bilateral feet but no edema of the lower extremities below the knees otherwise  Lymphadenopathy:     Cervical: No cervical adenopathy.  Skin:    General: Skin is warm and dry.     Findings: No erythema or rash.  Neurological:     Comments: The patient is able to open his eyes and his mouth when asked, he mumbles answers to questions that are an audible, he is able to wiggle his toes but not able to lift either leg, he can show me 2 fingers when asked  Psychiatric:        Behavior: Behavior normal.     ED Results / Procedures / Treatments   Labs (all labs ordered are listed, but only abnormal results are displayed) Labs Reviewed  CULTURE, BLOOD (ROUTINE X 2)  CULTURE, BLOOD (ROUTINE X 2)  RESP PANEL BY RT-PCR (RSV, FLU A&B, COVID)  [id number]  COMPREHENSIVE METABOLIC PANEL  CBC WITH DIFFERENTIAL/PLATELET  PROTIME-INR  APTT  URINALYSIS, W/ REFLEX TO CULTURE (INFECTION SUSPECTED)   TSH  MAGNESIUM  I-STAT CG4 LACTIC ACID, ED    EKG None  Radiology No results found.  Procedures Procedures  {Document cardiac monitor, telemetry assessment procedure when appropriate:1}  Medications Ordered in ED Medications  cefTRIAXone (ROCEPHIN) 1 g in sodium chloride 0.9 % 100 mL IVPB (has no administration in time range)    ED Course/ Medical Decision Making/ A&P   {   Click here  for ABCD2, HEART and other calculatorsREFRESH Note before signing :1}                              Medical Decision Making Amount and/or Complexity of Data Reviewed Labs: ordered. Radiology: ordered.    This patient presents to the ED for concern of altered mental status, this involves an extensive number of treatment options, and is a complaint that carries with it a high risk of complications and morbidity.  The differential diagnosis includes infection, AKI, stroke, tox, arrhythmia, ishchemia   Co morbidities that complicate the patient evaluation  Altered, dementia   Additional history obtained:  Additional history obtained from EMR,  External records from outside source obtained and reviewed including multiple prior admissions   Lab Tests:  I Ordered, and personally interpreted labs.  The pertinent results include:  ***   Imaging Studies ordered:  I ordered imaging studies including ***  I independently visualized and interpreted imaging which showed *** I agree with the radiologist interpretation   Cardiac Monitoring: / EKG:  The patient was maintained on a cardiac monitor.  I personally viewed and interpreted the cardiac monitored which showed an underlying rhythm of: ***   Consultations Obtained:  I requested consultation with the ***,  and discussed lab and imaging findings as well as pertinent plan - they recommend: ***   Problem List / ED Course / Critical interventions / Medication management  *** I ordered medication including ***  for ***   Reevaluation of the patient after these medicines showed that the patient {resolved/improved/worsened:23923::"improved"} I have reviewed the patients home medicines and have made adjustments as needed   Social Determinants of Health:  ***   Test / Admission - Considered:  ***   {Document critical care time when appropriate:1} {Document review of labs and clinical decision tools ie heart score, Chads2Vasc2 etc:1}  {Document your independent review of radiology images, and any outside records:1} {Document your discussion with family members, caretakers, and with consultants:1} {Document social determinants of health affecting pt's care:1} {Document your decision making why or why not admission, treatments were needed:1} Final Clinical Impression(s) / ED Diagnoses Final diagnoses:  None    Rx / DC Orders ED Discharge Orders     None

## 2023-08-07 ENCOUNTER — Inpatient Hospital Stay (HOSPITAL_COMMUNITY)

## 2023-08-07 DIAGNOSIS — G9341 Metabolic encephalopathy: Secondary | ICD-10-CM | POA: Diagnosis not present

## 2023-08-07 DIAGNOSIS — R001 Bradycardia, unspecified: Secondary | ICD-10-CM | POA: Diagnosis not present

## 2023-08-07 LAB — RETICULOCYTES
Immature Retic Fract: 10.5 % (ref 2.3–15.9)
RBC.: 3.87 MIL/uL — ABNORMAL LOW (ref 4.22–5.81)
Retic Count, Absolute: 57.3 10*3/uL (ref 19.0–186.0)
Retic Ct Pct: 1.5 % (ref 0.4–3.1)

## 2023-08-07 LAB — COMPREHENSIVE METABOLIC PANEL
ALT: 11 U/L (ref 0–44)
AST: 16 U/L (ref 15–41)
Albumin: 3.1 g/dL — ABNORMAL LOW (ref 3.5–5.0)
Alkaline Phosphatase: 92 U/L (ref 38–126)
Anion gap: 10 (ref 5–15)
BUN: 12 mg/dL (ref 8–23)
CO2: 22 mmol/L (ref 22–32)
Calcium: 9.7 mg/dL (ref 8.9–10.3)
Chloride: 108 mmol/L (ref 98–111)
Creatinine, Ser: 1.01 mg/dL (ref 0.61–1.24)
GFR, Estimated: >60 mL/min (ref >60)
Glucose, Bld: 118 mg/dL — ABNORMAL HIGH (ref 70–99)
Potassium: 3.7 mmol/L (ref 3.5–5.1)
Sodium: 140 mmol/L (ref 135–145)
Total Bilirubin: 1.4 mg/dL — ABNORMAL HIGH (ref 0.0–1.2)
Total Protein: 6.6 g/dL (ref 6.5–8.1)

## 2023-08-07 LAB — BLOOD CULTURE ID PANEL (REFLEXED) - BCID2

## 2023-08-07 LAB — VITAMIN B12: Vitamin B-12: 661 pg/mL (ref 180–914)

## 2023-08-07 LAB — IRON AND TIBC
Iron: 100 ug/dL (ref 45–182)
Saturation Ratios: 42 % — ABNORMAL HIGH (ref 17.9–39.5)
TIBC: 237 ug/dL — ABNORMAL LOW (ref 250–450)
UIBC: 137 ug/dL

## 2023-08-07 LAB — CBC
HCT: 36.8 % — ABNORMAL LOW (ref 39.0–52.0)
Hemoglobin: 12.4 g/dL — ABNORMAL LOW (ref 13.0–17.0)
MCH: 32.2 pg (ref 26.0–34.0)
MCHC: 33.7 g/dL (ref 30.0–36.0)
MCV: 95.6 fL (ref 80.0–100.0)
Platelets: 218 10*3/uL (ref 150–400)
RBC: 3.85 MIL/uL — ABNORMAL LOW (ref 4.22–5.81)
RDW: 13.2 % (ref 11.5–15.5)
WBC: 9.9 10*3/uL (ref 4.0–10.5)
nRBC: 0 % (ref 0.0–0.2)

## 2023-08-07 LAB — ECHOCARDIOGRAM COMPLETE
AR max vel: 3.1 cm2
AV Area VTI: 3.49 cm2
AV Area mean vel: 3.19 cm2
AV Mean grad: 2 mm[Hg]
AV Peak grad: 4.4 mm[Hg]
Ao pk vel: 1.05 m/s
Area-P 1/2: 5.62 cm2
Height: 65 in
S' Lateral: 1.9 cm
Weight: 2363.33 [oz_av]

## 2023-08-07 LAB — AMMONIA: Ammonia: 23 umol/L (ref 9–35)

## 2023-08-07 LAB — BASIC METABOLIC PANEL
Anion gap: 7 (ref 5–15)
BUN: 15 mg/dL (ref 8–23)
CO2: 24 mmol/L (ref 22–32)
Calcium: 9.2 mg/dL (ref 8.9–10.3)
Chloride: 104 mmol/L (ref 98–111)
Creatinine, Ser: 1.13 mg/dL (ref 0.61–1.24)
GFR, Estimated: >60 mL/min (ref >60)
Glucose, Bld: 182 mg/dL — ABNORMAL HIGH (ref 70–99)
Potassium: 4 mmol/L (ref 3.5–5.1)
Sodium: 135 mmol/L (ref 135–145)

## 2023-08-07 LAB — MAGNESIUM: Magnesium: 1.6 mg/dL — ABNORMAL LOW (ref 1.7–2.4)

## 2023-08-07 LAB — FOLATE: Folate: 37.5 ng/mL (ref >5.9)

## 2023-08-07 LAB — FERRITIN: Ferritin: 33 ng/mL (ref 24–336)

## 2023-08-07 MED ORDER — CEFTRIAXONE SODIUM 1 G IJ SOLR: 1.0000 g | INTRAMUSCULAR | Status: DC

## 2023-08-07 NOTE — Progress Notes (Signed)
 PROGRESS NOTE    Mike Mack  ZOX:[id number] DOB: July 13, 1935 DOA: 08/06/2023 PCP: Georgianne Fick, MD   Brief Narrative:  This 88 yrs old male with PMH significant for essential hypertension, dementia, BPH, insulin-dependent DM type II, history of CVA, hypothyroidism, history of polio with right-sided lower extremity weakness, right-sided lower extremity chronic fracture and bed bound at baseline.  He was brought by family after being found less responsive.  Patient was found to be bradycardic and hypotensive by EMS.  He was given 500 NS bolus and 1 mg of atropine afterwards blood pressure and heart rate improved.  Patient was not responsive although he was maintaining his airway.  After arrival in the ED blood pressure and heart rate improved.  Patient back to his baseline mental status and answer questions.  CT head no acute intracranial abnormality.  Chest x-ray no acute disease process.  UA consistent with UTI.  Patient was admitted and started on empiric antibiotics.  Assessment & Plan:   Active Problems:   Hypotension   Acute metabolic encephalopathy   Chronic diastolic CHF (congestive heart failure) (HCC)   Sinus bradycardia-resolved   CKD stage 3a, GFR 45-59 ml/min (HCC)   BPH (benign prostatic hyperplasia)   History of CVA (cerebrovascular accident)   History of poliomyelitis   Insulin dependent type 2 diabetes mellitus (HCC)   Chronic weakness of right lower extremity   History of dementia   Thrombocytopenia (HCC)  Acute metabolic encephalopathy, likely UTI: Presented with confusion, less responsive than usual as per family. Patient was found to be bradycardic with heart rate 45 and hypotensive SBP 86.   HR and BP improved after a dose of atropine and fluid bolus. Family denies any head injury or fall. Respiratory panel negative. UA+ UTI. Initiated on IV ceftriaxone then changed to IV Zosyn. Chest x-ray shows no acute disease process. CT head > no acute intracranial  infarction. MRI brain no acute intracranial findings. Mental status seems improved after getting antibiotics. Fall precautions, delirium precautions  Acute cystitis: Previous urine culture from 08/2022 showed Klebsiella pneumoniae only sensitive to gentamicin, imipenem and Zosyn. Patient complains of increased urinary frequency, hesitancy and UA consistent with UTI Initiated on ceftriaxone but changed to Zosyn. Follow-up urine culture and de-escalate antibiotics.  Sinus bradycardia > Resolved. Patient was found to have symptomatic bradycardia with heart rate 45 blood pressure 86.   Heart rate and blood pressure improved with a dose of atropine and saline bolus. TSH normal. Checking morning cortisol level. Follow-up echocardiogram. Continue telemetry.   Hypotension: > Resolved. Patient has history of hypertension and diastolic heart failure at baseline.   Blood pressure now on higher side after getting atropine. TSH within normal range.  Checking morning cortisol level. Checking echocardiogram.   Thrombocytopenia: > Resolved. This might have been lab error.   Acute on chronic anemia: H&H remains stable.   Essential hypertension: Resume amlodipine 2.5 mg daily.  CKD stage III: Serum creatinine at baseline.   Avoid nephrotoxic medications.  History of CVA: Continue aspirin 1 mg daily.   History of BPH: Continue Flomax.   Insulin-dependent DM type II: Carb modified diet.  Obtain hemoglobin A1c.   History of dementia: Continue memantine.   Physical deconditioning: Age-related debility: He has significant generalized muscle wasting of upper lower extremities. Nutrition consult for calorie requirement and advise.  DVT prophylaxis: SCDs Code Status: Full code Family Communication:  Wife at bed side Disposition Plan:    Status is: Inpatient Remains inpatient appropriate because: Admitted for acute  metabolic encephalopathy likely secondary to UTI   Consultants:   None  Procedures: CT head.  Antimicrobials:  Anti-infectives (From admission, onward)    Start     Dose/Rate Route Frequency Ordered Stop   08/07/23 1145  cefTRIAXone (ROCEPHIN) 1 g in sodium chloride 0.9 % 100 mL IVPB  Status:  Discontinued        1 g 200 mL/hr over 30 Minutes Intravenous Every 24 hours 08/07/23 1136 08/07/23 1137   08/06/23 2230  piperacillin-tazobactam (ZOSYN) IVPB 3.375 g        3.375 g 12.5 mL/hr over 240 Minutes Intravenous Every 8 hours 08/06/23 2220     08/06/23 2215  cefTRIAXone (ROCEPHIN) 1 g in sodium chloride 0.9 % 100 mL IVPB  Status:  Discontinued        1 g 200 mL/hr over 30 Minutes Intravenous Every 24 hours 08/06/23 2208 08/06/23 2208   08/06/23 1415  cefTRIAXone (ROCEPHIN) 1 g in sodium chloride 0.9 % 100 mL IVPB        1 g 200 mL/hr over 30 Minutes Intravenous  Once 08/06/23 1407 08/06/23 1715      Subjective: Patient was seen and examined at bedside.  Overnight events noted. Patient appears severely deconditioned but lying comfortably on the bed,  denies any chest pain or shortness of breath.  Objective: Vitals:   08/07/23 0909 08/07/23 0936 08/07/23 1000 08/07/23 1100  BP: (!) 147/87  (!) 154/86 (!) 143/79  Pulse:   86 86  Resp:   12 16  Temp:  98.3 F (36.8 C)    TempSrc:  Oral    SpO2:   100% 100%  Weight:      Height:        Intake/Output Summary (Last 24 hours) at 08/07/2023 1148 Last data filed at 08/06/2023 2237 Gross per 24 hour  Intake 1000 ml  Output --  Net 1000 ml   Filed Weights   08/06/23 2200  Weight: 67 kg    Examination:  General exam: Appears calm and comfortable, deconditioned, not in any acute distress. Respiratory system: Clear to auscultation. Respiratory effort normal.  RR 15 Cardiovascular system: S1 & S2 heard, RRR. No JVD, murmurs, rubs, gallops or clicks. Gastrointestinal system: Abdomen is non distended, soft and non tender. Normal bowel sounds heard. Central nervous system: Alert and oriented  x 2.  Right-sided weakness. Extremities: No edema, no cyanosis, no clubbing. Skin: No rashes, lesions or ulcers Psychiatry: Judgement and insight appear normal. Mood & affect appropriate.     Data Reviewed: I have personally reviewed following labs and imaging studies  CBC: Recent Labs  Lab 08/06/23 1717 08/06/23 1857 08/07/23 0502  WBC 5.4  --  9.9  NEUTROABS 4.3  --   --   HGB 8.8* 12.2* 12.4*  HCT 27.5* 36.0* 36.8*  MCV 101.9*  --  95.6  PLT 91*  --  218   Basic Metabolic Panel: Recent Labs  Lab 08/06/23 1857 08/07/23 0502  NA 138  141 140  K 3.9  4.0 3.7  CL 106  110 108  CO2 22 22  GLUCOSE 142*  134* 118*  BUN 15  14 12   CREATININE 1.02  1.00 1.01  CALCIUM 9.5 9.7  MG 1.6*  --    GFR: Estimated Creatinine Clearance: 46.5 mL/min (by C-G formula based on SCr of 1.01 mg/dL). Liver Function Tests: Recent Labs  Lab 08/06/23 1857 08/07/23 0502  AST 14* 16  ALT 11 11  ALKPHOS 90 92  BILITOT 1.0 1.4*  PROT 6.5 6.6  ALBUMIN 3.1* 3.1*   No results for input(s): "LIPASE", "AMYLASE" in the last 168 hours. Recent Labs  Lab 08/07/23 0502  AMMONIA 23   Coagulation Profile: Recent Labs  Lab 08/06/23 1857  INR 1.2   Cardiac Enzymes: No results for input(s): "CKTOTAL", "CKMB", "CKMBINDEX", "TROPONINI" in the last 168 hours. BNP (last 3 results) No results for input(s): "PROBNP" in the last 8760 hours. HbA1C: No results for input(s): "HGBA1C" in the last 72 hours. CBG: No results for input(s): "GLUCAP" in the last 168 hours. Lipid Profile: No results for input(s): "CHOL", "HDL", "LDLCALC", "TRIG", "CHOLHDL", "LDLDIRECT" in the last 72 hours. Thyroid Function Tests: Recent Labs    08/06/23 1857  TSH 2.544   Anemia Panel: Recent Labs    08/07/23 0502  FOLATE 37.5  FERRITIN 33  TIBC 237*  IRON 100  RETICCTPCT 1.5   Sepsis Labs: Recent Labs  Lab 08/06/23 1726 08/06/23 1857  LATICACIDVEN 0.5 0.9    Recent Results (from the past 240  hours)  Blood Culture (routine x 2)     Status: None (Preliminary result)   Collection Time: 08/06/23  4:38 PM   Specimen: BLOOD  Result Value Ref Range Status   Specimen Description BLOOD SITE NOT SPECIFIED  Final   Special Requests   Final    BOTTLES DRAWN AEROBIC AND ANAEROBIC Blood Culture results may not be optimal due to an inadequate volume of blood received in culture bottles   Culture  Setup Time   Final    GRAM POSITIVE COCCI IN CLUSTERS ANAEROBIC BOTTLE ONLY CRITICAL RESULT CALLED TO, READ BACK BY AND VERIFIED WITH: PHARMD J ZHOU [id number] AT 1027 AM BY CM Performed at [facility], [address] [place], [place] [address]    Culture GRAM POSITIVE COCCI  Final   Report Status PENDING  Incomplete  Blood Culture ID Panel (Reflexed)     Status: Abnormal   Collection Time: 08/06/23  4:38 PM  Result Value Ref Range Status   Enterococcus faecalis NOT DETECTED NOT DETECTED Final   Enterococcus Faecium NOT DETECTED NOT DETECTED Final   Listeria monocytogenes NOT DETECTED NOT DETECTED Final   Staphylococcus species DETECTED (A) NOT DETECTED Final    Comment: CRITICAL RESULT CALLED TO, READ BACK BY AND VERIFIED WITH: PHARMD J ZHOU [id number] AT 1017 AM BY CM    Staphylococcus aureus (BCID) NOT DETECTED NOT DETECTED Final   Staphylococcus epidermidis DETECTED (A) NOT DETECTED Final    Comment: Methicillin (oxacillin) resistant coagulase negative staphylococcus. Possible blood culture contaminant (unless isolated from more than one blood culture draw or clinical case suggests pathogenicity). No antibiotic treatment is indicated for blood  culture contaminants. CRITICAL RESULT CALLED TO, READ BACK BY AND VERIFIED WITH: PHARMD J ZHOU [id number] AT 1027 AM BY CM    Staphylococcus lugdunensis NOT DETECTED NOT DETECTED Final   Streptococcus species NOT DETECTED NOT DETECTED Final   Streptococcus agalactiae NOT DETECTED NOT DETECTED Final   Streptococcus pneumoniae NOT DETECTED NOT DETECTED  Final   Streptococcus pyogenes NOT DETECTED NOT DETECTED Final   A.calcoaceticus-baumannii NOT DETECTED NOT DETECTED Final   Bacteroides fragilis NOT DETECTED NOT DETECTED Final   Enterobacterales NOT DETECTED NOT DETECTED Final   Enterobacter cloacae complex NOT DETECTED NOT DETECTED Final   Escherichia coli NOT DETECTED NOT DETECTED Final   Klebsiella aerogenes NOT DETECTED NOT DETECTED Final   Klebsiella oxytoca NOT DETECTED NOT DETECTED Final   Klebsiella pneumoniae  NOT DETECTED NOT DETECTED Final   Proteus species NOT DETECTED NOT DETECTED Final   Salmonella species NOT DETECTED NOT DETECTED Final   Serratia marcescens NOT DETECTED NOT DETECTED Final   Haemophilus influenzae NOT DETECTED NOT DETECTED Final   Neisseria meningitidis NOT DETECTED NOT DETECTED Final   Pseudomonas aeruginosa NOT DETECTED NOT DETECTED Final   Stenotrophomonas maltophilia NOT DETECTED NOT DETECTED Final   Candida albicans NOT DETECTED NOT DETECTED Final   Candida auris NOT DETECTED NOT DETECTED Final   Candida glabrata NOT DETECTED NOT DETECTED Final   Candida krusei NOT DETECTED NOT DETECTED Final   Candida parapsilosis NOT DETECTED NOT DETECTED Final   Candida tropicalis NOT DETECTED NOT DETECTED Final   Cryptococcus neoformans/gattii NOT DETECTED NOT DETECTED Final   Methicillin resistance mecA/C DETECTED (A) NOT DETECTED Final    Comment: CRITICAL RESULT CALLED TO, READ BACK BY AND VERIFIED WITH: PHARMD J ZHOU [id number] AT 1027 AM BY CM Performed at [facility], [address] [place], [place] [address]   Culture, blood (Routine X 2) w Reflex to ID Panel     Status: None (Preliminary result)   Collection Time: 08/06/23  5:17 PM   Specimen: BLOOD RIGHT ARM  Result Value Ref Range Status   Specimen Description BLOOD RIGHT ARM  Final   Special Requests   Final    BOTTLES DRAWN AEROBIC ONLY Blood Culture results may not be optimal due to an inadequate volume of blood received in culture bottles    Culture   Final    NO GROWTH < 24 HOURS Performed at [facility], [address], [place], [place] [address]    Report Status PENDING  Incomplete  Resp panel by RT-PCR (RSV, Flu A&B, Covid) Anterior Nasal Swab     Status: None   Collection Time: 08/06/23  5:50 PM   Specimen: Anterior Nasal Swab  Result Value Ref Range Status   SARS Coronavirus 2 by RT PCR NEGATIVE NEGATIVE Final   Influenza A by PCR NEGATIVE NEGATIVE Final   Influenza B by PCR NEGATIVE NEGATIVE Final    Comment: (NOTE) The Xpert Xpress SARS-CoV-2/FLU/RSV plus assay is intended as an aid in the diagnosis of influenza from Nasopharyngeal swab specimens and should not be used as a sole basis for treatment. Nasal washings and aspirates are unacceptable for Xpert Xpress SARS-CoV-2/FLU/RSV testing.  Fact Sheet for Patients: BloggerCourse.com  Fact Sheet for Healthcare Providers: SeriousBroker.it  This test is not yet approved or cleared by the [place] FDA and has been authorized for detection and/or diagnosis of SARS-CoV-2 by FDA under an Emergency Use Authorization (EUA). This EUA will remain in effect (meaning this test can be used) for the duration of the COVID-19 declaration under Section 564(b)(1) of the Act, 21 U.S.C. section 360bbb-3(b)(1), unless the authorization is terminated or revoked.     Resp Syncytial Virus by PCR NEGATIVE NEGATIVE Final    Comment: (NOTE) Fact Sheet for Patients: BloggerCourse.com  Fact Sheet for Healthcare Providers: SeriousBroker.it  This test is not yet approved or cleared by the [place] FDA and has been authorized for detection and/or diagnosis of SARS-CoV-2 by FDA under an Emergency Use Authorization (EUA). This EUA will remain in effect (meaning this test can be used) for the duration of the COVID-19 declaration under Section 564(b)(1) of the Act, 21  U.S.C. section 360bbb-3(b)(1), unless the authorization is terminated or revoked.  Performed at [facility], [address], [place], [place]  [address]   Urine Culture     Status: None (Preliminary result)   Collection Time: 08/06/23  7:44 PM   Specimen: Urine, Clean Catch  Result Value Ref Range Status   Specimen Description URINE, CLEAN CATCH  Final   Special Requests   Final    NONE Reflexed from [id number] Performed at [facility], [address], [place], [place] [address]    Culture PENDING  Incomplete   Report Status PENDING  Incomplete    Radiology Studies: ECHOCARDIOGRAM COMPLETE Result Date: 08/07/2023    ECHOCARDIOGRAM REPORT   Patient Name:   ELMOR KOST Date of Exam: 08/07/2023 Medical Rec #:  [id number]      Height:       65.0 in Accession #:    [id number]     Weight:       147.7 lb Date of Birth:  1936/03/19     BSA:          1.739 m Patient Age:    87 years       BP:           159/98 mmHg Patient Gender: M              HR:           85 bpm. Exam Location:  Inpatient Procedure: 2D Echo, Cardiac Doppler and Color Doppler (Both Spectral and Color            Flow Doppler were utilized during procedure). Indications:    Bradycardia  History:        Patient has prior history of Echocardiogram examinations, most                 recent 04/03/2022. Risk Factors:Hypertension.  Sonographer:    Karma Ganja Referring Phys: [id number] SUBRINA SUNDIL  Sonographer Comments: Technically difficult study due to poor echo windows. Image acquisition challenging due to patient behavioral factors. IMPRESSIONS  1. Left ventricular ejection fraction, by estimation, is 65 to 70%. The left ventricle has normal function. The left ventricle has no regional wall motion abnormalities. Indeterminate diastolic filling due to E-A fusion.  2. Right ventricular systolic function is normal. The right ventricular size is normal.  3. The mitral valve is normal in structure. No evidence of mitral valve  regurgitation. No evidence of mitral stenosis.  4. The aortic valve was not well visualized. Aortic valve regurgitation is not visualized.  5. Evidence of atrial level shunting detected by color flow Doppler. Comparison(s): Prior images reviewed side by side. Ventricular function is more vigorous than prior. FINDINGS  Left Ventricle: Left ventricular ejection fraction, by estimation, is 65 to 70%. The left ventricle has normal function. The left ventricle has no regional wall motion abnormalities. Strain imaging was not performed. The left ventricular internal cavity  size was small. Suboptimal image quality limits for assessment of left ventricular hypertrophy. Indeterminate diastolic filling due to E-A fusion. Right Ventricle: The right ventricular size is normal. No increase in right ventricular wall thickness. Right ventricular systolic function is normal. Left Atrium: Left atrial size was normal in size. Right Atrium: Right atrial size was normal in size. Pericardium: There is no evidence of pericardial effusion. Mitral Valve: The mitral valve is normal in structure. No evidence of mitral valve regurgitation. No evidence of mitral valve stenosis. Tricuspid Valve: The tricuspid valve is normal in structure. Tricuspid valve regurgitation is trivial. No evidence of tricuspid stenosis. Aortic Valve: The aortic valve was not well visualized. Aortic valve regurgitation  is not visualized. Aortic valve mean gradient measures 2.0 mmHg. Aortic valve peak gradient measures 4.4 mmHg. Aortic valve area, by VTI measures 3.49 cm. Pulmonic Valve: The pulmonic valve was not well visualized. Pulmonic valve regurgitation is not visualized. No evidence of pulmonic stenosis. Aorta: The aortic root is normal in size and structure. IAS/Shunts: Evidence of atrial level shunting detected by color flow Doppler. Additional Comments: 3D imaging was not performed.  LEFT VENTRICLE PLAX 2D LVIDd:         2.90 cm   Diastology LVIDs:          1.90 cm   LV e' medial:    5.87 cm/s LV PW:         1.10 cm   LV E/e' medial:  6.8 LV IVS:        0.90 cm   LV e' lateral:   7.62 cm/s LVOT diam:     1.80 cm   LV E/e' lateral: 5.2 LV SV:         58 LV SV Index:   33 LVOT Area:     2.54 cm  RIGHT VENTRICLE RV Basal diam:  2.80 cm RV S prime:     6.74 cm/s TAPSE (M-mode): 1.6 cm LEFT ATRIUM             Index        RIGHT ATRIUM          Index LA diam:        2.50 cm 1.44 cm/m   RA Area:     8.76 cm LA Vol (A2C):   22.4 ml 12.88 ml/m  RA Volume:   19.50 ml 11.21 ml/m LA Vol (A4C):   20.3 ml 11.67 ml/m LA Biplane Vol: 23.1 ml 13.28 ml/m  AORTIC VALVE AV Area (Vmax):    3.10 cm AV Area (Vmean):   3.19 cm AV Area (VTI):     3.49 cm AV Vmax:           105.00 cm/s AV Vmean:          66.500 cm/s AV VTI:            0.165 m AV Peak Grad:      4.4 mmHg AV Mean Grad:      2.0 mmHg LVOT Vmax:         128.00 cm/s LVOT Vmean:        83.400 cm/s LVOT VTI:          0.226 m LVOT/AV VTI ratio: 1.37  AORTA Ao Root diam: 3.20 cm MITRAL VALVE MV Area (PHT): 5.62 cm    SHUNTS MV Decel Time: 135 msec    Systemic VTI:  0.23 m MV E velocity: 40.00 cm/s  Systemic Diam: 1.80 cm MV A velocity: 92.40 cm/s MV E/A ratio:  0.43 Riley Lam MD Electronically signed by Riley Lam MD Signature Date/Time: 08/07/2023/11:47:15 AM    Final    MR BRAIN WO CONTRAST Result Date: 08/06/2023 CLINICAL DATA:  Mental status change of unknown cause EXAM: MRI HEAD WITHOUT CONTRAST TECHNIQUE: Multiplanar, multiecho pulse sequences of the brain and surrounding structures were obtained without intravenous contrast. COMPARISON:  Head CT same day.  MRI 10/10/2022 FINDINGS: Brain: Diffusion imaging does not show any acute or subacute infarction or other cause of restricted diffusion. Mild chronic small-vessel ischemic change affects the pons. There are numerous old small vessel cerebellar infarctions. Cerebral hemispheres show central volume loss with ventriculomegaly. There is an old  infarction in  the left thalamus. There are moderate chronic small-vessel ischemic changes of the white matter. No large vessel territory stroke is seen. No mass, hemorrhage, hydrocephalus or extra-axial collection. Vascular: Major vessels at the base of the brain show flow. Skull and upper cervical spine: Negative Sinuses/Orbits: Paranasal sinuses are clear.  Orbits negative. Other: Small bilateral mastoid effusions. IMPRESSION: 1. No acute finding by MRI. Chronic small-vessel ischemic changes of the pons, cerebellum and cerebral hemispheric white matter. Old left thalamic infarction. Central volume loss with ventriculomegaly. 2. Small bilateral mastoid effusions. Electronically Signed   By: Paulina Fusi M.D.   On: 08/06/2023 23:24   DG Chest Port 1 View Result Date: 08/06/2023 CLINICAL DATA:  Sepsis presentation. EXAM: PORTABLE CHEST 1 VIEW COMPARISON:  10/09/2022 FINDINGS: The heart size and mediastinal contours are within normal limits. Both lungs are clear. The visualized skeletal structures are unremarkable. IMPRESSION: No active disease. Electronically Signed   By: Paulina Fusi M.D.   On: 08/06/2023 15:59   CT Head Wo Contrast Result Date: 08/06/2023 CLINICAL DATA:  Neuro deficit, acute, stroke suspected EXAM: CT HEAD WITHOUT CONTRAST TECHNIQUE: Contiguous axial images were obtained from the base of the skull through the vertex without intravenous contrast. RADIATION DOSE REDUCTION: This exam was performed according to the departmental dose-optimization program which includes automated exposure control, adjustment of the mA and/or kV according to patient size and/or use of iterative reconstruction technique. COMPARISON:  10/26/2022 FINDINGS: Brain: The study suffers from considerable artifact. Old small vessel infarctions affect the cerebellum. Cerebral hemispheres show chronic ventriculomegaly with chronic small-vessel ischemic changes of the white matter. Previous infarction of the left thalamus.  Chronic ventriculomegaly which appears the same as seen previously. This could be ex vacuo enlargement, but normal pressure hydrocephalus is not excluded. No evidence of hemorrhage or extra-axial collection. Vascular: There is atherosclerotic calcification of the major vessels at the base of the brain. Skull: Negative Sinuses/Orbits: Clear/normal Other: None IMPRESSION: No acute CT finding. Old small vessel infarctions of the cerebellum and left thalamus. Chronic small-vessel ischemic changes of the white matter. Chronic ventriculomegaly which could be ex vacuo enlargement, but normal pressure hydrocephalus is not excluded. Electronically Signed   By: Paulina Fusi M.D.   On: 08/06/2023 15:59    Scheduled Meds:  amLODipine  2.5 mg Oral Daily   aspirin EC  81 mg Oral Daily   atorvastatin  20 mg Oral QPM   levothyroxine  50 mcg Oral Q0600   memantine  10 mg Oral BID   pantoprazole  40 mg Oral Daily   sodium chloride flush  3 mL Intravenous Q12H   tamsulosin  0.4 mg Oral QPC breakfast   Continuous Infusions:  sodium chloride     piperacillin-tazobactam (ZOSYN)  IV 3.375 g (08/07/23 0920)     LOS: 1 day    Time spent: 50 mins    [name], MD [facility]   If 7PM-7AM, please contact night-coverage

## 2023-08-07 NOTE — Progress Notes (Signed)
  Echocardiogram 2D Echocardiogram has been performed.  Mike Mack 08/07/2023, 10:17 AM

## 2023-08-07 NOTE — Plan of Care (Signed)
 Chronically ill and bed bound.

## 2023-08-08 DIAGNOSIS — G9341 Metabolic encephalopathy: Secondary | ICD-10-CM

## 2023-08-08 LAB — PHOSPHORUS: Phosphorus: 2 mg/dL — ABNORMAL LOW (ref 2.5–4.6)

## 2023-08-08 LAB — URINE CULTURE

## 2023-08-08 LAB — CBC
HCT: 35 % — ABNORMAL LOW (ref 39.0–52.0)
Hemoglobin: 12 g/dL — ABNORMAL LOW (ref 13.0–17.0)
MCH: 32.5 pg (ref 26.0–34.0)
MCHC: 34.3 g/dL (ref 30.0–36.0)
MCV: 94.9 fL (ref 80.0–100.0)
Platelets: 208 10*3/uL (ref 150–400)
RBC: 3.69 MIL/uL — ABNORMAL LOW (ref 4.22–5.81)
RDW: 13.3 % (ref 11.5–15.5)
WBC: 6.7 10*3/uL (ref 4.0–10.5)
nRBC: 0 % (ref 0.0–0.2)

## 2023-08-08 LAB — BASIC METABOLIC PANEL
Anion gap: 11 (ref 5–15)
BUN: 13 mg/dL (ref 8–23)
CO2: 21 mmol/L — ABNORMAL LOW (ref 22–32)
Calcium: 9.8 mg/dL (ref 8.9–10.3)
Chloride: 104 mmol/L (ref 98–111)
Creatinine, Ser: 1.61 mg/dL — ABNORMAL HIGH (ref 0.61–1.24)
GFR, Estimated: 42 mL/min — ABNORMAL LOW (ref >60)
Glucose, Bld: 114 mg/dL — ABNORMAL HIGH (ref 70–99)
Potassium: 3.6 mmol/L (ref 3.5–5.1)
Sodium: 136 mmol/L (ref 135–145)

## 2023-08-08 LAB — CORTISOL-AM, BLOOD: Cortisol - AM: 11.6 ug/dL (ref 6.7–22.6)

## 2023-08-08 MED ORDER — SODIUM CHLORIDE 0.9 % IV SOLN: INTRAVENOUS | Status: DC

## 2023-08-08 MED ORDER — INFLUENZA VAC A&B SURF ANT ADJ 0.5 ML IM SUSY
0.5000 mL | PREFILLED_SYRINGE | INTRAMUSCULAR | Status: DC
  Filled 2023-08-08: qty 0.5

## 2023-08-08 MED ORDER — ENSURE ENLIVE PO LIQD
237.0000 mL | Freq: Two times a day (BID) | ORAL | Status: DC
  Administered 2023-08-08 – 2023-08-09 (×3): 237 mL via ORAL

## 2023-08-08 MED ORDER — MAGNESIUM SULFATE 2 GM/50ML IV SOLN
2.0000 g | Freq: Once | INTRAVENOUS | Status: AC
  Administered 2023-08-08: 2 g via INTRAVENOUS
  Filled 2023-08-08: qty 50

## 2023-08-08 MED ORDER — ADULT MULTIVITAMIN W/MINERALS CH
1.0000 | ORAL_TABLET | Freq: Every day | ORAL | Status: DC
  Administered 2023-08-08 – 2023-08-09 (×2): 1 via ORAL
  Filled 2023-08-08 (×2): qty 1

## 2023-08-08 MED ORDER — CEFADROXIL 500 MG PO CAPS
500.0000 mg | ORAL_CAPSULE | Freq: Two times a day (BID) | ORAL | Status: DC
  Administered 2023-08-08 – 2023-08-09 (×3): 500 mg via ORAL
  Filled 2023-08-08 (×3): qty 1

## 2023-08-08 NOTE — Progress Notes (Signed)
 PROGRESS NOTE    Mike Mack  ZOX:[id number] DOB: [birth year] DOA: 08/06/2023 PCP: Georgianne Fick, MD   Brief Narrative:  88 yrs old male with PMH significant for essential hypertension, dementia, BPH, insulin-dependent DM type II, history of CVA, hypothyroidism, history of polio with right-sided lower extremity weakness, right-sided lower extremity chronic fracture and bed bound at baseline was brought by family after being found less responsive.  Patient was found to be bradycardic and hypotensive by EMS.  He was given 500 NS bolus and 1 mg of atropine afterwards blood pressure and heart rate improved.  Patient was not responsive although he was maintaining his airway.  After arrival in the ED blood pressure and heart rate improved. CT head no acute intracranial abnormality. Chest x-ray no acute disease process. UA consistent with UTI. Patient was admitted and started on empiric antibiotics.   Assessment & Plan:   Acute metabolic encephalopathy, possibly from UTI -Presented with confusion and less responsiveness than usual as per family -CT head no acute intracranial abnormality. -MRI brain showed no acute intracranial findings. -Mental status seems improved after getting antibiotics.  Still slow to respond.  Fall precautions.  Delirium precautions.  PT eval.  UTI/acute cystitis: Present on admission -On Zosyn currently.  Urine cultures negative so far.  Switch to cefadroxil.  Sinus bradycardia: Resolved -TSH normal.  2D echo showed EF of 65 to 70%  Hypotension History of hypertension -Resolved.  Blood pressure still intermittently on the lower side but stable -Continue amlodipine  Anemia of chronic disease -From chronic illnesses.  Hemoglobin stable  Acute kidney injury on CKD stage IIIa -Possibly from poor oral intake.  Creatinine 1.61 today.  Start gentle hydration.  Encourage oral intake  Hypomagnesemia -No labs today.  Monitor  History of unspecified CVA Mostly  bedbound status Hyperlipidemia -Continue aspirin and statin.  Outpatient follow-up with neurology  Dementia--continue memantine.  Fall/delirium precautions  Physical deconditioning -PT eval.  BPH -Continue Flomax  Diabetes mellitus type 2 -Blood sugars currently controlled  Severe malnutrition -Follow nutrition recommendations   DVT prophylaxis: SCDs Code Status:  DNR.  Confirmed by the wife at bedside Family Communication: Wife at bedside Disposition Plan: Status is: Inpatient Remains inpatient appropriate because: Of severity of illness.  Need for PT eval  Consultants: None  Procedures: 2D echo  Antimicrobials:  Anti-infectives (From admission, onward)    Start     Dose/Rate Route Frequency Ordered Stop   08/08/23 1000  cefadroxil (DURICEF) capsule 500 mg        500 mg Oral 2 times daily 08/08/23 0816 08/10/23 2359   08/07/23 1145  cefTRIAXone (ROCEPHIN) 1 g in sodium chloride 0.9 % 100 mL IVPB  Status:  Discontinued        1 g 200 mL/hr over 30 Minutes Intravenous Every 24 hours 08/07/23 1136 08/07/23 1137   08/06/23 2230  piperacillin-tazobactam (ZOSYN) IVPB 3.375 g  Status:  Discontinued        3.375 g 12.5 mL/hr over 240 Minutes Intravenous Every 8 hours 08/06/23 2220 08/08/23 0816   08/06/23 2215  cefTRIAXone (ROCEPHIN) 1 g in sodium chloride 0.9 % 100 mL IVPB  Status:  Discontinued        1 g 200 mL/hr over 30 Minutes Intravenous Every 24 hours 08/06/23 2208 08/06/23 2208   08/06/23 1415  cefTRIAXone (ROCEPHIN) 1 g in sodium chloride 0.9 % 100 mL IVPB        1 g 200 mL/hr over 30 Minutes Intravenous  Once 08/06/23 1407  08/06/23 1715        Subjective: Patient seen and examined at bedside.  No fever, seizures or agitation reported.  Wife at bedside states that mental status is improving and oral intake is improving as well.  Objective: Vitals:   08/07/23 1616 08/07/23 2038 08/08/23 0324 08/08/23 0825  BP: 111/66 (!) 111/54 127/69 139/84  Pulse: 79 79  91 97  Resp: 18 18  18   Temp: 97.8 F (36.6 C) 97.6 F (36.4 C) 97.8 F (36.6 C) 98.1 F (36.7 C)  TempSrc: Oral Oral Axillary   SpO2: 97% 100%    Weight:      Height:        Intake/Output Summary (Last 24 hours) at 08/08/2023 1127 Last data filed at 08/08/2023 0600 Gross per 24 hour  Intake 967.55 ml  Output 250 ml  Net 717.55 ml   Filed Weights   08/06/23 2200  Weight: 67 kg    Examination:  General exam: Appears calm and comfortable.  Chronically ill and deconditioned.  On room air. Respiratory system: Bilateral decreased breath sounds at bases Cardiovascular system: S1 & S2 heard, Rate controlled Gastrointestinal system: Abdomen is nondistended, soft and nontender. Normal bowel sounds heard. Extremities: No cyanosis, clubbing, edema  Central nervous system: Awake.  Slow to respond.  Poor historian.  Slightly confused.  Right-sided weakness present.   Skin: No rashes, lesions or ulcers Psychiatry: Flat affect.  Not agitated.    Data Reviewed: I have personally reviewed following labs and imaging studies  CBC: Recent Labs  Lab 08/06/23 1717 08/06/23 1857 08/07/23 0502 08/08/23 0403  WBC 5.4  --  9.9 6.7  NEUTROABS 4.3  --   --   --   HGB 8.8* 12.2* 12.4* 12.0*  HCT 27.5* 36.0* 36.8* 35.0*  MCV 101.9*  --  95.6 94.9  PLT 91*  --  218 208   Basic Metabolic Panel: Recent Labs  Lab 08/06/23 1857 08/07/23 0502 08/07/23 2131 08/08/23 0403  NA 138  141 140 135 136  K 3.9  4.0 3.7 4.0 3.6  CL 106  110 108 104 104  CO2 22 22 24  21*  GLUCOSE 142*  134* 118* 182* 114*  BUN 15  14 12 15 13   CREATININE 1.02  1.00 1.01 1.13 1.61*  CALCIUM 9.5 9.7 9.2 9.8  MG 1.6*  --  1.6*  --   PHOS  --   --   --  2.0*   GFR: Estimated Creatinine Clearance: 29.2 mL/min (A) (by C-G formula based on SCr of 1.61 mg/dL (H)). Liver Function Tests: Recent Labs  Lab 08/06/23 1857 08/07/23 0502  AST 14* 16  ALT 11 11  ALKPHOS 90 92  BILITOT 1.0 1.4*  PROT 6.5 6.6   ALBUMIN 3.1* 3.1*   No results for input(s): "LIPASE", "AMYLASE" in the last 168 hours. Recent Labs  Lab 08/07/23 0502  AMMONIA 23   Coagulation Profile: Recent Labs  Lab 08/06/23 1857  INR 1.2   Cardiac Enzymes: No results for input(s): "CKTOTAL", "CKMB", "CKMBINDEX", "TROPONINI" in the last 168 hours. BNP (last 3 results) No results for input(s): "PROBNP" in the last 8760 hours. HbA1C: No results for input(s): "HGBA1C" in the last 72 hours. CBG: No results for input(s): "GLUCAP" in the last 168 hours. Lipid Profile: No results for input(s): "CHOL", "HDL", "LDLCALC", "TRIG", "CHOLHDL", "LDLDIRECT" in the last 72 hours. Thyroid Function Tests: Recent Labs    08/06/23 1857  TSH 2.544   Anemia Panel: Recent Labs  08/07/23 0502 08/07/23 0509  VITAMINB12  --  661  FOLATE 37.5  --   FERRITIN 33  --   TIBC 237*  --   IRON 100  --   RETICCTPCT 1.5  --    Sepsis Labs: Recent Labs  Lab 08/06/23 1726 08/06/23 1857  LATICACIDVEN 0.5 0.9    Recent Results (from the past 240 hours)  Blood Culture (routine x 2)     Status: Abnormal (Preliminary result)   Collection Time: 08/06/23  4:38 PM   Specimen: BLOOD  Result Value Ref Range Status   Specimen Description BLOOD SITE NOT SPECIFIED  Final   Special Requests   Final    BOTTLES DRAWN AEROBIC AND ANAEROBIC Blood Culture results may not be optimal due to an inadequate volume of blood received in culture bottles   Culture  Setup Time   Final    GRAM POSITIVE COCCI IN CLUSTERS IN BOTH AEROBIC AND ANAEROBIC BOTTLES CRITICAL RESULT CALLED TO, READ BACK BY AND VERIFIED WITH: PHARMD J ZHOU [id number] AT 1027 AM BY CM    Culture (A)  Final    STAPHYLOCOCCUS CAPITIS STAPHYLOCOCCUS HOMINIS CULTURE REINCUBATED FOR BETTER GROWTH Performed at [facility], [address] [place], [place] [address]    Report Status PENDING  Incomplete  Blood Culture ID Panel (Reflexed)     Status: Abnormal   Collection Time: 08/06/23   4:38 PM  Result Value Ref Range Status   Enterococcus faecalis NOT DETECTED NOT DETECTED Final   Enterococcus Faecium NOT DETECTED NOT DETECTED Final   Listeria monocytogenes NOT DETECTED NOT DETECTED Final   Staphylococcus species DETECTED (A) NOT DETECTED Final    Comment: CRITICAL RESULT CALLED TO, READ BACK BY AND VERIFIED WITH: PHARMD J ZHOU [id number] AT 1017 AM BY CM    Staphylococcus aureus (BCID) NOT DETECTED NOT DETECTED Final   Staphylococcus epidermidis DETECTED (A) NOT DETECTED Final    Comment: Methicillin (oxacillin) resistant coagulase negative staphylococcus. Possible blood culture contaminant (unless isolated from more than one blood culture draw or clinical case suggests pathogenicity). No antibiotic treatment is indicated for blood  culture contaminants. CRITICAL RESULT CALLED TO, READ BACK BY AND VERIFIED WITH: PHARMD J ZHOU [id number] AT 1027 AM BY CM    Staphylococcus lugdunensis NOT DETECTED NOT DETECTED Final   Streptococcus species NOT DETECTED NOT DETECTED Final   Streptococcus agalactiae NOT DETECTED NOT DETECTED Final   Streptococcus pneumoniae NOT DETECTED NOT DETECTED Final   Streptococcus pyogenes NOT DETECTED NOT DETECTED Final   A.calcoaceticus-baumannii NOT DETECTED NOT DETECTED Final   Bacteroides fragilis NOT DETECTED NOT DETECTED Final   Enterobacterales NOT DETECTED NOT DETECTED Final   Enterobacter cloacae complex NOT DETECTED NOT DETECTED Final   Escherichia coli NOT DETECTED NOT DETECTED Final   Klebsiella aerogenes NOT DETECTED NOT DETECTED Final   Klebsiella oxytoca NOT DETECTED NOT DETECTED Final   Klebsiella pneumoniae NOT DETECTED NOT DETECTED Final   Proteus species NOT DETECTED NOT DETECTED Final   Salmonella species NOT DETECTED NOT DETECTED Final   Serratia marcescens NOT DETECTED NOT DETECTED Final   Haemophilus influenzae NOT DETECTED NOT DETECTED Final   Neisseria meningitidis NOT DETECTED NOT DETECTED Final   Pseudomonas aeruginosa NOT  DETECTED NOT DETECTED Final   Stenotrophomonas maltophilia NOT DETECTED NOT DETECTED Final   Candida albicans NOT DETECTED NOT DETECTED Final   Candida auris NOT DETECTED NOT DETECTED Final   Candida glabrata NOT DETECTED NOT DETECTED Final   Candida krusei NOT DETECTED  NOT DETECTED Final   Candida parapsilosis NOT DETECTED NOT DETECTED Final   Candida tropicalis NOT DETECTED NOT DETECTED Final   Cryptococcus neoformans/gattii NOT DETECTED NOT DETECTED Final   Methicillin resistance mecA/C DETECTED (A) NOT DETECTED Final    Comment: CRITICAL RESULT CALLED TO, READ BACK BY AND VERIFIED WITH: PHARMD J ZHOU [id number] AT 1027 AM BY CM Performed at [facility], [address] [place], [place] [address]   Culture, blood (Routine X 2) w Reflex to ID Panel     Status: None (Preliminary result)   Collection Time: 08/06/23  5:17 PM   Specimen: BLOOD RIGHT ARM  Result Value Ref Range Status   Specimen Description BLOOD RIGHT ARM  Final   Special Requests   Final    BOTTLES DRAWN AEROBIC ONLY Blood Culture results may not be optimal due to an inadequate volume of blood received in culture bottles   Culture   Final    NO GROWTH 2 DAYS Performed at [facility], [address], [place], [place] [address]    Report Status PENDING  Incomplete  Resp panel by RT-PCR (RSV, Flu A&B, Covid) Anterior Nasal Swab     Status: None   Collection Time: 08/06/23  5:50 PM   Specimen: Anterior Nasal Swab  Result Value Ref Range Status   SARS Coronavirus 2 by RT PCR NEGATIVE NEGATIVE Final   Influenza A by PCR NEGATIVE NEGATIVE Final   Influenza B by PCR NEGATIVE NEGATIVE Final    Comment: (NOTE) The Xpert Xpress SARS-CoV-2/FLU/RSV plus assay is intended as an aid in the diagnosis of influenza from Nasopharyngeal swab specimens and should not be used as a sole basis for treatment. Nasal washings and aspirates are unacceptable for Xpert Xpress SARS-CoV-2/FLU/RSV testing.  Fact Sheet for  Patients: BloggerCourse.com  Fact Sheet for Healthcare Providers: SeriousBroker.it  This test is not yet approved or cleared by the [place] FDA and has been authorized for detection and/or diagnosis of SARS-CoV-2 by FDA under an Emergency Use Authorization (EUA). This EUA will remain in effect (meaning this test can be used) for the duration of the COVID-19 declaration under Section 564(b)(1) of the Act, 21 U.S.C. section 360bbb-3(b)(1), unless the authorization is terminated or revoked.     Resp Syncytial Virus by PCR NEGATIVE NEGATIVE Final    Comment: (NOTE) Fact Sheet for Patients: BloggerCourse.com  Fact Sheet for Healthcare Providers: SeriousBroker.it  This test is not yet approved or cleared by the [place] FDA and has been authorized for detection and/or diagnosis of SARS-CoV-2 by FDA under an Emergency Use Authorization (EUA). This EUA will remain in effect (meaning this test can be used) for the duration of the COVID-19 declaration under Section 564(b)(1) of the Act, 21 U.S.C. section 360bbb-3(b)(1), unless the authorization is terminated or revoked.  Performed at [facility], [address] [place], [place] [address]   Urine Culture     Status: None (Preliminary result)   Collection Time: 08/06/23  7:44 PM   Specimen: Urine, Clean Catch  Result Value Ref Range Status   Specimen Description URINE, CLEAN CATCH  Final   Special Requests NONE Reflexed from [id number]  Final   Culture   Final    NO GROWTH Performed at [facility], [address], [place], [place] [address]    Report Status PENDING  Incomplete         Radiology Studies: ECHOCARDIOGRAM COMPLETE Result Date: 08/07/2023    ECHOCARDIOGRAM  REPORT   Patient Name:   ENRIGUE HASHIMI Date of Exam: 08/07/2023 Medical Rec #:  [id number]      Height:       65.0 in Accession #:     [id number]     Weight:       147.7 lb Date of Birth:  [birth year]     BSA:          1.739 m Patient Age:    87 years       BP:           159/98 mmHg Patient Gender: M              HR:           85 bpm. Exam Location:  Inpatient Procedure: 2D Echo, Cardiac Doppler and Color Doppler (Both Spectral and Color            Flow Doppler were utilized during procedure). Indications:    Bradycardia  History:        Patient has prior history of Echocardiogram examinations, most                 recent 04/03/2022. Risk Factors:Hypertension.  Sonographer:    Karma Ganja Referring Phys: [id number] SUBRINA SUNDIL  Sonographer Comments: Technically difficult study due to poor echo windows. Image acquisition challenging due to patient behavioral factors. IMPRESSIONS  1. Left ventricular ejection fraction, by estimation, is 65 to 70%. The left ventricle has normal function. The left ventricle has no regional wall motion abnormalities. Indeterminate diastolic filling due to E-A fusion.  2. Right ventricular systolic function is normal. The right ventricular size is normal.  3. The mitral valve is normal in structure. No evidence of mitral valve regurgitation. No evidence of mitral stenosis.  4. The aortic valve was not well visualized. Aortic valve regurgitation is not visualized.  5. Evidence of atrial level shunting detected by color flow Doppler. Comparison(s): Prior images reviewed side by side. Ventricular function is more vigorous than prior. FINDINGS  Left Ventricle: Left ventricular ejection fraction, by estimation, is 65 to 70%. The left ventricle has normal function. The left ventricle has no regional wall motion abnormalities. Strain imaging was not performed. The left ventricular internal cavity  size was small. Suboptimal image quality limits for assessment of left ventricular hypertrophy. Indeterminate diastolic filling due to E-A fusion. Right Ventricle: The right ventricular size is normal. No increase in right ventricular  wall thickness. Right ventricular systolic function is normal. Left Atrium: Left atrial size was normal in size. Right Atrium: Right atrial size was normal in size. Pericardium: There is no evidence of pericardial effusion. Mitral Valve: The mitral valve is normal in structure. No evidence of mitral valve regurgitation. No evidence of mitral valve stenosis. Tricuspid Valve: The tricuspid valve is normal in structure. Tricuspid valve regurgitation is trivial. No evidence of tricuspid stenosis. Aortic Valve: The aortic valve was not well visualized. Aortic valve regurgitation is not visualized. Aortic valve mean gradient measures 2.0 mmHg. Aortic valve peak gradient measures 4.4 mmHg. Aortic valve area, by VTI measures 3.49 cm. Pulmonic Valve: The pulmonic valve was not well visualized. Pulmonic valve regurgitation is not visualized. No evidence of pulmonic stenosis. Aorta: The aortic root is normal in size and structure. IAS/Shunts: Evidence of atrial level shunting detected by color flow Doppler. Additional Comments: 3D imaging was not performed.  LEFT VENTRICLE PLAX 2D LVIDd:         2.90 cm   Diastology LVIDs:  1.90 cm   LV e' medial:    5.87 cm/s LV PW:         1.10 cm   LV E/e' medial:  6.8 LV IVS:        0.90 cm   LV e' lateral:   7.62 cm/s LVOT diam:     1.80 cm   LV E/e' lateral: 5.2 LV SV:         58 LV SV Index:   33 LVOT Area:     2.54 cm  RIGHT VENTRICLE RV Basal diam:  2.80 cm RV S prime:     6.74 cm/s TAPSE (M-mode): 1.6 cm LEFT ATRIUM             Index        RIGHT ATRIUM          Index LA diam:        2.50 cm 1.44 cm/m   RA Area:     8.76 cm LA Vol (A2C):   22.4 ml 12.88 ml/m  RA Volume:   19.50 ml 11.21 ml/m LA Vol (A4C):   20.3 ml 11.67 ml/m LA Biplane Vol: 23.1 ml 13.28 ml/m  AORTIC VALVE AV Area (Vmax):    3.10 cm AV Area (Vmean):   3.19 cm AV Area (VTI):     3.49 cm AV Vmax:           105.00 cm/s AV Vmean:          66.500 cm/s AV VTI:            0.165 m AV Peak Grad:      4.4  mmHg AV Mean Grad:      2.0 mmHg LVOT Vmax:         128.00 cm/s LVOT Vmean:        83.400 cm/s LVOT VTI:          0.226 m LVOT/AV VTI ratio: 1.37  AORTA Ao Root diam: 3.20 cm MITRAL VALVE MV Area (PHT): 5.62 cm    SHUNTS MV Decel Time: 135 msec    Systemic VTI:  0.23 m MV E velocity: 40.00 cm/s  Systemic Diam: 1.80 cm MV A velocity: 92.40 cm/s MV E/A ratio:  0.43 Riley Lam MD Electronically signed by Riley Lam MD Signature Date/Time: 08/07/2023/11:47:15 AM    Final    MR BRAIN WO CONTRAST Result Date: 08/06/2023 CLINICAL DATA:  Mental status change of unknown cause EXAM: MRI HEAD WITHOUT CONTRAST TECHNIQUE: Multiplanar, multiecho pulse sequences of the brain and surrounding structures were obtained without intravenous contrast. COMPARISON:  Head CT same day.  MRI 10/10/2022 FINDINGS: Brain: Diffusion imaging does not show any acute or subacute infarction or other cause of restricted diffusion. Mild chronic small-vessel ischemic change affects the pons. There are numerous old small vessel cerebellar infarctions. Cerebral hemispheres show central volume loss with ventriculomegaly. There is an old infarction in the left thalamus. There are moderate chronic small-vessel ischemic changes of the white matter. No large vessel territory stroke is seen. No mass, hemorrhage, hydrocephalus or extra-axial collection. Vascular: Major vessels at the base of the brain show flow. Skull and upper cervical spine: Negative Sinuses/Orbits: Paranasal sinuses are clear.  Orbits negative. Other: Small bilateral mastoid effusions. IMPRESSION: 1. No acute finding by MRI. Chronic small-vessel ischemic changes of the pons, cerebellum and cerebral hemispheric white matter. Old left thalamic infarction. Central volume loss with ventriculomegaly. 2. Small bilateral mastoid effusions. Electronically Signed   By: Scherrie Bateman.D.  On: 08/06/2023 23:24   DG Chest Port 1 View Result Date: 08/06/2023 CLINICAL DATA:   Sepsis presentation. EXAM: PORTABLE CHEST 1 VIEW COMPARISON:  10/09/2022 FINDINGS: The heart size and mediastinal contours are within normal limits. Both lungs are clear. The visualized skeletal structures are unremarkable. IMPRESSION: No active disease. Electronically Signed   By: Paulina Fusi M.D.   On: 08/06/2023 15:59   CT Head Wo Contrast Result Date: 08/06/2023 CLINICAL DATA:  Neuro deficit, acute, stroke suspected EXAM: CT HEAD WITHOUT CONTRAST TECHNIQUE: Contiguous axial images were obtained from the base of the skull through the vertex without intravenous contrast. RADIATION DOSE REDUCTION: This exam was performed according to the departmental dose-optimization program which includes automated exposure control, adjustment of the mA and/or kV according to patient size and/or use of iterative reconstruction technique. COMPARISON:  10/26/2022 FINDINGS: Brain: The study suffers from considerable artifact. Old small vessel infarctions affect the cerebellum. Cerebral hemispheres show chronic ventriculomegaly with chronic small-vessel ischemic changes of the white matter. Previous infarction of the left thalamus. Chronic ventriculomegaly which appears the same as seen previously. This could be ex vacuo enlargement, but normal pressure hydrocephalus is not excluded. No evidence of hemorrhage or extra-axial collection. Vascular: There is atherosclerotic calcification of the major vessels at the base of the brain. Skull: Negative Sinuses/Orbits: Clear/normal Other: None IMPRESSION: No acute CT finding. Old small vessel infarctions of the cerebellum and left thalamus. Chronic small-vessel ischemic changes of the white matter. Chronic ventriculomegaly which could be ex vacuo enlargement, but normal pressure hydrocephalus is not excluded. Electronically Signed   By: Paulina Fusi M.D.   On: 08/06/2023 15:59        Scheduled Meds:  amLODipine  2.5 mg Oral Daily   aspirin EC  81 mg Oral Daily   atorvastatin   20 mg Oral QPM   cefadroxil  500 mg Oral BID   feeding supplement  237 mL Oral BID BM   levothyroxine  50 mcg Oral Q0600   memantine  10 mg Oral BID   multivitamin with minerals  1 tablet Oral Daily   pantoprazole  40 mg Oral Daily   sodium chloride flush  3 mL Intravenous Q12H   tamsulosin  0.4 mg Oral QPC breakfast   Continuous Infusions:        Glade Lloyd, MD [facility] 08/08/2023, 11:27 AM

## 2023-08-08 NOTE — Progress Notes (Signed)
 Telephone consent to administer flu vaccine obtained from wife Lupe Carney Tarea Skillman

## 2023-08-08 NOTE — Plan of Care (Signed)
   Problem: Health Behavior/Discharge Planning: Goal: Ability to manage health-related needs will improve Outcome: Progressing   Problem: Clinical Measurements: Goal: Ability to maintain clinical measurements within normal limits will improve Outcome: Progressing Goal: Will remain free from infection Outcome: Progressing Goal: Diagnostic test results will improve Outcome: Progressing

## 2023-08-08 NOTE — TOC CM/SW Note (Signed)
 Transition of Care [facility]) - Inpatient Brief Assessment   Patient Details  Name: Trapper Meech MRN: [id number] Date of Birth: [birth year]  [facility] of [facility] CM/SW Contact:    [name], [name], RN Phone Number: 08/08/2023, 4:03 PM   Clinical Narrative:  Patient presented to the ED with AMS, unable to open Eyes and not responding to Verbal Stimuli. Admitted with Acute Metabolic Encephalopathy. Has hx of Dementia, BPH, Insulin-dependent DM type II, CVA, Hypothyroidism, Polio with Rt-sided LE Weakness, and bed bound at baseline.   From home with wife, has two supportive children. Has all DME's at home.  PCP is Georgianne Fick, MD and uses [facility] on [address]  Wife requesting PTAR to transport at discharge.   Patient not Medically ready for discharge.  CM will continue to follow as patient progresses with care towards discharge.           Transition of Care Asessment: Insurance and Status: Insurance coverage has been reviewed Patient has primary care physician: Yes Home environment has been reviewed: Yes Prior level of function:: Dependent Prior/Current Home Services: Current home services Social Drivers of Health Review: SDOH reviewed no interventions necessary Readmission risk has been reviewed: Yes Transition of care needs: transition of care needs identified, TOC will continue to follow

## 2023-08-08 NOTE — Progress Notes (Addendum)
 Initial Nutrition Assessment  DOCUMENTATION CODES:   Severe malnutrition in context of chronic illness  INTERVENTION:  Calorie count x48 hours to assess intake Ensure Enlive po BID, each supplement provides 350 kcal and 20 grams of protein. Dysphagia 3 diet for ease of consumption, liberalized to regular Magic cup TID with meals, each supplement provides 290 kcal and 9 grams of protein MVI w/ minerals  NUTRITION DIAGNOSIS:  Severe Malnutrition related to chronic illness as evidenced by energy intake < or equal to 75% for > or equal to 1 month, severe muscle depletion, severe fat depletion.  GOAL:  Patient will meet greater than or equal to 90% of their needs  MONITOR:  PO intake, Weight trends, Supplement acceptance  REASON FOR ASSESSMENT:  Consult Assessment of nutrition requirement/status, Calorie Count  ASSESSMENT:   Patient presented to ED w/ reports, by family, of being less responsive. Found to have a UTI. PMH: HTN, dementia, BPH, T2DM, hypothyroidism, CVA, polio.   Met with patient and his wife, Mike Mack, at bedside. She endorses appropriate appetite up until hospitalization. She does the cooking and grocery shopping. He does not consume any supplement at home and is wheelchair bound d/t polio dx. She reports helping cut his food up during meals for ease of consumption, but states he does not have difficulty chewing or swallowing at baseline. Upper dentition noted, but absent on the bottom. Amicable to changing to Dysphagia 3 diet. No recent N/V/C/D.   24 Hour Recall B: cheerios or cornflakes L: vegetable soup and green beans w/ baked chicken D: meat, potatoes, vegetable  Did discuss the need for adequate protein intake to preserve lean body mass and manage blood sugars. Recommending supplementation to patient's wife, as his baseline diet does not endorse adequate consumption.  Not currently on any insulin. Does need updated A1c to assess blood sugar trend.   Patient's  wife reports he has lost about 10lbs in last year, which is a 6.3% wt loss, and not considered significant. Lower extremities severely wasted in appearance, which is consistent w/ patient being wheelchair bound. His wife is his caretaker, but they also have HH come in multiple times per week. Some generalized mild edema noted to RLE and LUE.   Admit Weight: 67kg ? Accuracy, appears to pulled over from last admission (10/26/22) - will recommend new weight  Did require repletion of magnesium. PHOS noted as low. Will start MVI.    Meds: levothyroxine, pantoprazole, Mg sulfate x1, ABX  Labs:  Na+ 136 (wdl) K+ 3.6 (wdl) PHOS 2.0 (L) Mg 1.6 (L) CBGs 114-182 over 48 hours A1c 6.8 (09/2022)   NUTRITION - FOCUSED PHYSICAL EXAM: Flowsheet Row Most Recent Value  Orbital Region Mild depletion  Upper Arm Region Moderate depletion  Thoracic and Lumbar Region Severe depletion  Buccal Region Severe depletion  Temple Region Moderate depletion  Clavicle Bone Region Severe depletion  Clavicle and Acromion Bone Region Severe depletion  Scapular Bone Region Severe depletion  Dorsal Hand --  [mittens]  Patellar Region Severe depletion  Anterior Thigh Region Severe depletion  Posterior Calf Region Severe depletion  Edema (RD Assessment) Mild  [LUE and RLE]  Hair Reviewed  Eyes Reviewed  Mouth Reviewed  [upper dentition, but not lower]  Skin Reviewed  Nails Reviewed     Diet Order:   Diet Order             Diet heart healthy/carb modified Room service appropriate? Yes; Fluid consistency: Thin  Diet effective now  EDUCATION NEEDS:  Education needs have been addressed  Skin:  Skin Assessment: Reviewed RN Assessment  Last BM:  2/16 - PTA  Height:  Ht Readings from Last 1 Encounters:  08/06/23 5\' 5"  (1.651 m)   Weight:  Wt Readings from Last 1 Encounters:  08/06/23 67 kg    BMI:  Body mass index is 24.58 kg/m.  Estimated Nutritional Needs:   Kcal:   1700-1900kcal  Protein:  75-85g  Fluid:  >1.7L/day  Myrtie Cruise MS, RD, LDN Registered Dietitian Clinical Nutrition RD Inpatient Contact Info in Amion

## 2023-08-08 NOTE — Progress Notes (Signed)
 PT Cancellation Note  Patient Details Name: Mike Mack MRN: [id number] DOB: 07/30/1935   Cancelled Treatment:    Reason Eval/Treat Not Completed: PT screened, no needs identified, will sign off (Spoke with pt's spouse via phone call and discussed pt's baseline and CLOF. Pt has been non-ambulatory ~3 years, pt spends most of his time in bed, Total care for all mobility and ADL's in bed. Pt's grandson transfers pt OOB to [facility] occasionally, but rare. Per discussion with spouse pt is not at appropriate level for skilled PT in acute setting and pt/family express no goals for mobility. Plan to return home with home aid/RN. Will sign off at this time.)    Renaldo Fiddler PT, DPT Acute Rehabilitation Services Office [phone]  08/08/23 4:35 PM

## 2023-08-09 ENCOUNTER — Other Ambulatory Visit (HOSPITAL_COMMUNITY): Payer: Self-pay

## 2023-08-09 DIAGNOSIS — G9341 Metabolic encephalopathy: Secondary | ICD-10-CM | POA: Diagnosis not present

## 2023-08-09 LAB — BASIC METABOLIC PANEL
Anion gap: 9 (ref 5–15)
BUN: 14 mg/dL (ref 8–23)
CO2: 23 mmol/L (ref 22–32)
Calcium: 9.1 mg/dL (ref 8.9–10.3)
Chloride: 105 mmol/L (ref 98–111)
Creatinine, Ser: 1.11 mg/dL (ref 0.61–1.24)
GFR, Estimated: >60 mL/min (ref >60)
Glucose, Bld: 146 mg/dL — ABNORMAL HIGH (ref 70–99)
Potassium: 4 mmol/L (ref 3.5–5.1)
Sodium: 137 mmol/L (ref 135–145)

## 2023-08-09 LAB — CBC WITH DIFFERENTIAL/PLATELET
Abs Immature Granulocytes: 0.04 10*3/uL (ref 0.00–0.07)
Basophils Absolute: 0 10*3/uL (ref 0.0–0.1)
Basophils Relative: 0 %
Eosinophils Absolute: 0 10*3/uL (ref 0.0–0.5)
Eosinophils Relative: 0 %
HCT: 32.9 % — ABNORMAL LOW (ref 39.0–52.0)
Hemoglobin: 11 g/dL — ABNORMAL LOW (ref 13.0–17.0)
Immature Granulocytes: 1 %
Lymphocytes Relative: 19 %
Lymphs Abs: 0.9 10*3/uL (ref 0.7–4.0)
MCH: 31.9 pg (ref 26.0–34.0)
MCHC: 33.4 g/dL (ref 30.0–36.0)
MCV: 95.4 fL (ref 80.0–100.0)
Monocytes Absolute: 0.4 10*3/uL (ref 0.1–1.0)
Monocytes Relative: 9 %
Neutro Abs: 3.4 10*3/uL (ref 1.7–7.7)
Neutrophils Relative %: 71 %
Platelets: 176 10*3/uL (ref 150–400)
RBC: 3.45 MIL/uL — ABNORMAL LOW (ref 4.22–5.81)
RDW: 13.5 % (ref 11.5–15.5)
WBC: 4.8 10*3/uL (ref 4.0–10.5)
nRBC: 0 % (ref 0.0–0.2)

## 2023-08-09 LAB — MAGNESIUM: Magnesium: 1.8 mg/dL (ref 1.7–2.4)

## 2023-08-09 MED ORDER — CEFADROXIL 500 MG PO CAPS
500.0000 mg | ORAL_CAPSULE | Freq: Two times a day (BID) | ORAL | 0 refills | Status: AC
  Filled 2023-08-09: qty 6, 3d supply, fill #0

## 2023-08-09 MED ORDER — IBUPROFEN 600 MG PO TABS
600.0000 mg | ORAL_TABLET | Freq: Once | ORAL | Status: AC
  Administered 2023-08-09: 600 mg via ORAL
  Filled 2023-08-09: qty 1

## 2023-08-09 MED ORDER — DIPHENHYDRAMINE HCL 50 MG/ML IJ SOLN
12.5000 mg | Freq: Once | INTRAMUSCULAR | Status: AC
  Administered 2023-08-09: 12.5 mg via INTRAVENOUS
  Filled 2023-08-09: qty 1

## 2023-08-09 NOTE — Discharge Summary (Signed)
 Physician Discharge Summary  Mike Mack NFA:[id number] DOB: [birth year] DOA: 08/06/2023  PCP: Georgianne Fick, MD  Admit date: 08/06/2023 Discharge date: 08/09/2023  Admitted From: Home Disposition: Home  Recommendations for Outpatient Follow-up:  Follow up with PCP in 1 week with repeat CBC/BMP Recommend outpatient evaluation and follow-up by palliative care Follow up in ED if symptoms worsen or new appear   Home Health: No Equipment/Devices: None  Discharge Condition: Guarded  CODE STATUS: DNR Diet recommendation: Heart healthy/diet as per SLP recommendations  Brief/Interim Summary: 88 yrs old male with PMH significant for essential hypertension, dementia, BPH, insulin-dependent DM type II, history of CVA, hypothyroidism, history of polio with right-sided lower extremity weakness, right-sided lower extremity chronic fracture and bed bound at baseline was brought by family after being found less responsive.  Patient was found to be bradycardic and hypotensive by EMS.  He was given 500 NS bolus and 1 mg of atropine afterwards blood pressure and heart rate improved.  Patient was not responsive although he was maintaining his airway.  After arrival in the ED blood pressure and heart rate improved. CT head no acute intracranial abnormality. Chest x-ray no acute disease process. UA consistent with UTI. Patient was admitted and started on empiric antibiotics.  During the hospitalization, his condition has improved.  Urine cultures have remained negative so far.  He has been switched to oral cefadroxil.  He will be discharged home today on 3 more days of oral cefadroxil.  Discharge Diagnoses:   Acute metabolic encephalopathy, possibly from UTI -Presented with confusion and less responsiveness than usual as per family -CT head no acute intracranial abnormality. -MRI brain showed no acute intracranial findings. -Mental status seems improved after getting antibiotics.  Still slow to  respond.    UTI/acute cystitis: Present on admission -On Zosyn earlier.  Urine cultures negative so far.  Currently on cefadroxil.  He will be discharged home today on 3 more days of oral cefadroxil.   Sinus bradycardia: Resolved -TSH normal.  2D echo showed EF of 65 to 70%   Hypotension History of hypertension -Resolved.  Blood pressure still intermittently on the lower side but stable -Continue amlodipine   Anemia of chronic disease -From chronic illnesses.  Hemoglobin stable   Acute kidney injury on CKD stage IIIa -Improved.Possibly from poor oral intake.  Creatinine has improved to 1.11 today.  Outpatient follow-up.  Encourage oral intake   Hypomagnesemia -Improved  Hypophosphatemia -No labs today  History of unspecified CVA Mostly bedbound status Hyperlipidemia -Continue aspirin and statin.  Outpatient follow-up with neurology   Dementia--continue memantine.  Outpatient follow-up   BPH -Continue Flomax   Diabetes mellitus type 2 -Blood sugars currently controlled.  Outpatient follow-up   Severe malnutrition -Follow nutrition recommendations   Discharge Instructions  Discharge Instructions     Diet - low sodium heart healthy   Complete by: As directed    Increase activity slowly   Complete by: As directed       Allergies as of 08/09/2023       Reactions   Insulin Glargine    Insulin Glargine-lixisenatide Other (See Comments), Nausea And Vomiting   Stomach cramps   Metformin And Related Nausea And Vomiting, Other (See Comments)   Gi intolerance   Nsaids Other (See Comments)   Renal insufficiency        Medication List     STOP taking these medications    QUEtiapine 25 MG tablet Commonly known as: SEROQUEL       TAKE  these medications    acetaminophen 325 MG tablet Commonly known as: TYLENOL Take 2 tablets (650 mg total) by mouth every 6 (six) hours as needed for mild pain (or Fever >/= 101).   amLODipine 2.5 MG tablet Commonly  known as: NORVASC Take 1 tablet (2.5 mg total) by mouth daily.   aspirin EC 81 MG tablet Take 1 tablet (81 mg total) by mouth daily. Swallow whole.   atorvastatin 20 MG tablet Commonly known as: LIPITOR Take 20 mg by mouth every evening.   B COMPLEX PO Take 1 tablet by mouth at bedtime.   brimonidine 0.2 % ophthalmic solution Commonly known as: ALPHAGAN Place 1 drop into both eyes 2 (two) times daily.   cefadroxil 500 MG capsule Commonly known as: DURICEF Take 1 capsule (500 mg total) by mouth 2 (two) times daily for 3 days.   cholecalciferol 10 MCG (400 UNIT) Tabs tablet Commonly known as: VITAMIN D3 Take 400 Units by mouth at bedtime.   dorzolamide-timolol 2-0.5 % ophthalmic solution Commonly known as: COSOPT Place 1 drop into both eyes 2 (two) times daily.   Kerendia 10 MG Tabs Generic drug: Finerenone Take 10 mg by mouth daily.   latanoprost 0.005 % ophthalmic solution Commonly known as: XALATAN Place 1 drop into both eyes at bedtime.   levothyroxine 50 MCG tablet Commonly known as: SYNTHROID Take 1 tablet (50 mcg total) by mouth daily at 6 (six) AM.   memantine 10 MG tablet Commonly known as: NAMENDA Take 10 mg by mouth in the morning and at bedtime.   pantoprazole 40 MG tablet Commonly known as: Protonix Take 1 tablet (40 mg total) by mouth daily.   polyethylene glycol 17 g packet Commonly known as: MIRALAX / GLYCOLAX Take 17 g by mouth daily.   Rhopressa 0.02 % Soln Generic drug: Netarsudil Dimesylate Place 1 drop into both eyes at bedtime.   tamsulosin 0.4 MG Caps capsule Commonly known as: FLOMAX Take 0.4 mg by mouth at bedtime.        Follow-up Information     Georgianne Fick, MD. Schedule an appointment as soon as possible for a visit in 1 week(s).   Specialty: Internal Medicine Contact information: [address] [place] [address] [place] [address] [phone]                Allergies  Allergen Reactions   Insulin  Glargine    Insulin Glargine-Lixisenatide Other (See Comments) and Nausea And Vomiting    Stomach cramps   Metformin And Related Nausea And Vomiting and Other (See Comments)    Gi intolerance   Nsaids Other (See Comments)    Renal insufficiency    Consultations: None   Procedures/Studies: ECHOCARDIOGRAM COMPLETE Result Date: 08/07/2023    ECHOCARDIOGRAM REPORT   Patient Name:   Mike Mack Date of Exam: 08/07/2023 Medical Rec #:  [id number]      Height:       65.0 in Accession #:    [id number]     Weight:       147.7 lb Date of Birth:  04-19-1936     BSA:          1.739 m Patient Age:    87 years       BP:           159/98 mmHg Patient Gender: M              HR:           85 bpm. Exam  Location:  Inpatient Procedure: 2D Echo, Cardiac Doppler and Color Doppler (Both Spectral and Color            Flow Doppler were utilized during procedure). Indications:    Bradycardia  History:        Patient has prior history of Echocardiogram examinations, most                 recent 04/03/2022. Risk Factors:Hypertension.  Sonographer:    Karma Ganja Referring Phys: [id number] SUBRINA SUNDIL  Sonographer Comments: Technically difficult study due to poor echo windows. Image acquisition challenging due to patient behavioral factors. IMPRESSIONS  1. Left ventricular ejection fraction, by estimation, is 65 to 70%. The left ventricle has normal function. The left ventricle has no regional wall motion abnormalities. Indeterminate diastolic filling due to E-A fusion.  2. Right ventricular systolic function is normal. The right ventricular size is normal.  3. The mitral valve is normal in structure. No evidence of mitral valve regurgitation. No evidence of mitral stenosis.  4. The aortic valve was not well visualized. Aortic valve regurgitation is not visualized.  5. Evidence of atrial level shunting detected by color flow Doppler. Comparison(s): Prior images reviewed side by side. Ventricular function is more vigorous than  prior. FINDINGS  Left Ventricle: Left ventricular ejection fraction, by estimation, is 65 to 70%. The left ventricle has normal function. The left ventricle has no regional wall motion abnormalities. Strain imaging was not performed. The left ventricular internal cavity  size was small. Suboptimal image quality limits for assessment of left ventricular hypertrophy. Indeterminate diastolic filling due to E-A fusion. Right Ventricle: The right ventricular size is normal. No increase in right ventricular wall thickness. Right ventricular systolic function is normal. Left Atrium: Left atrial size was normal in size. Right Atrium: Right atrial size was normal in size. Pericardium: There is no evidence of pericardial effusion. Mitral Valve: The mitral valve is normal in structure. No evidence of mitral valve regurgitation. No evidence of mitral valve stenosis. Tricuspid Valve: The tricuspid valve is normal in structure. Tricuspid valve regurgitation is trivial. No evidence of tricuspid stenosis. Aortic Valve: The aortic valve was not well visualized. Aortic valve regurgitation is not visualized. Aortic valve mean gradient measures 2.0 mmHg. Aortic valve peak gradient measures 4.4 mmHg. Aortic valve area, by VTI measures 3.49 cm. Pulmonic Valve: The pulmonic valve was not well visualized. Pulmonic valve regurgitation is not visualized. No evidence of pulmonic stenosis. Aorta: The aortic root is normal in size and structure. IAS/Shunts: Evidence of atrial level shunting detected by color flow Doppler. Additional Comments: 3D imaging was not performed.  LEFT VENTRICLE PLAX 2D LVIDd:         2.90 cm   Diastology LVIDs:         1.90 cm   LV e' medial:    5.87 cm/s LV PW:         1.10 cm   LV E/e' medial:  6.8 LV IVS:        0.90 cm   LV e' lateral:   7.62 cm/s LVOT diam:     1.80 cm   LV E/e' lateral: 5.2 LV SV:         58 LV SV Index:   33 LVOT Area:     2.54 cm  RIGHT VENTRICLE RV Basal diam:  2.80 cm RV S prime:     6.74  cm/s TAPSE (M-mode): 1.6 cm LEFT ATRIUM  Index        RIGHT ATRIUM          Index LA diam:        2.50 cm 1.44 cm/m   RA Area:     8.76 cm LA Vol (A2C):   22.4 ml 12.88 ml/m  RA Volume:   19.50 ml 11.21 ml/m LA Vol (A4C):   20.3 ml 11.67 ml/m LA Biplane Vol: 23.1 ml 13.28 ml/m  AORTIC VALVE AV Area (Vmax):    3.10 cm AV Area (Vmean):   3.19 cm AV Area (VTI):     3.49 cm AV Vmax:           105.00 cm/s AV Vmean:          66.500 cm/s AV VTI:            0.165 m AV Peak Grad:      4.4 mmHg AV Mean Grad:      2.0 mmHg LVOT Vmax:         128.00 cm/s LVOT Vmean:        83.400 cm/s LVOT VTI:          0.226 m LVOT/AV VTI ratio: 1.37  AORTA Ao Root diam: 3.20 cm MITRAL VALVE MV Area (PHT): 5.62 cm    SHUNTS MV Decel Time: 135 msec    Systemic VTI:  0.23 m MV E velocity: 40.00 cm/s  Systemic Diam: 1.80 cm MV A velocity: 92.40 cm/s MV E/A ratio:  0.43 Riley Lam MD Electronically signed by Riley Lam MD Signature Date/Time: 08/07/2023/11:47:15 AM    Final    MR BRAIN WO CONTRAST Result Date: 08/06/2023 CLINICAL DATA:  Mental status change of unknown cause EXAM: MRI HEAD WITHOUT CONTRAST TECHNIQUE: Multiplanar, multiecho pulse sequences of the brain and surrounding structures were obtained without intravenous contrast. COMPARISON:  Head CT same day.  MRI 10/10/2022 FINDINGS: Brain: Diffusion imaging does not show any acute or subacute infarction or other cause of restricted diffusion. Mild chronic small-vessel ischemic change affects the pons. There are numerous old small vessel cerebellar infarctions. Cerebral hemispheres show central volume loss with ventriculomegaly. There is an old infarction in the left thalamus. There are moderate chronic small-vessel ischemic changes of the white matter. No large vessel territory stroke is seen. No mass, hemorrhage, hydrocephalus or extra-axial collection. Vascular: Major vessels at the base of the brain show flow. Skull and upper cervical spine:  Negative Sinuses/Orbits: Paranasal sinuses are clear.  Orbits negative. Other: Small bilateral mastoid effusions. IMPRESSION: 1. No acute finding by MRI. Chronic small-vessel ischemic changes of the pons, cerebellum and cerebral hemispheric white matter. Old left thalamic infarction. Central volume loss with ventriculomegaly. 2. Small bilateral mastoid effusions. Electronically Signed   By: Paulina Fusi M.D.   On: 08/06/2023 23:24   DG Chest Port 1 View Result Date: 08/06/2023 CLINICAL DATA:  Sepsis presentation. EXAM: PORTABLE CHEST 1 VIEW COMPARISON:  10/09/2022 FINDINGS: The heart size and mediastinal contours are within normal limits. Both lungs are clear. The visualized skeletal structures are unremarkable. IMPRESSION: No active disease. Electronically Signed   By: Paulina Fusi M.D.   On: 08/06/2023 15:59   CT Head Wo Contrast Result Date: 08/06/2023 CLINICAL DATA:  Neuro deficit, acute, stroke suspected EXAM: CT HEAD WITHOUT CONTRAST TECHNIQUE: Contiguous axial images were obtained from the base of the skull through the vertex without intravenous contrast. RADIATION DOSE REDUCTION: This exam was performed according to the departmental dose-optimization program which includes automated exposure control, adjustment of  the mA and/or kV according to patient size and/or use of iterative reconstruction technique. COMPARISON:  10/26/2022 FINDINGS: Brain: The study suffers from considerable artifact. Old small vessel infarctions affect the cerebellum. Cerebral hemispheres show chronic ventriculomegaly with chronic small-vessel ischemic changes of the white matter. Previous infarction of the left thalamus. Chronic ventriculomegaly which appears the same as seen previously. This could be ex vacuo enlargement, but normal pressure hydrocephalus is not excluded. No evidence of hemorrhage or extra-axial collection. Vascular: There is atherosclerotic calcification of the major vessels at the base of the brain. Skull:  Negative Sinuses/Orbits: Clear/normal Other: None IMPRESSION: No acute CT finding. Old small vessel infarctions of the cerebellum and left thalamus. Chronic small-vessel ischemic changes of the white matter. Chronic ventriculomegaly which could be ex vacuo enlargement, but normal pressure hydrocephalus is not excluded. Electronically Signed   By: Paulina Fusi M.D.   On: 08/06/2023 15:59      Subjective: Seen and examined at bedside.  Awake, answers some questions and feels okay to go home today.  Still slow to respond and a poor historian.  No fever, seizures, vomiting reported.  Discharge Exam: Vitals:   08/09/23 0507 08/09/23 0802  BP: (!) 140/77 136/80  Pulse: 89 82  Resp: 17 18  Temp: 98.3 F (36.8 C)   SpO2: 100%     General: Awake, slow to respond, poor historian.  On room air.  Right-sided weakness present Cardiovascular: rate controlled, S1/S2 + Respiratory: bilateral decreased breath sounds at bases Abdominal: Soft, NT, ND, bowel sounds + Extremities: no edema, no cyanosis    The results of significant diagnostics from this hospitalization (including imaging, microbiology, ancillary and laboratory) are listed below for reference.     Microbiology: Recent Results (from the past 240 hours)  Blood Culture (routine x 2)     Status: Abnormal (Preliminary result)   Collection Time: 08/06/23  4:38 PM   Specimen: BLOOD  Result Value Ref Range Status   Specimen Description BLOOD SITE NOT SPECIFIED  Final   Special Requests   Final    BOTTLES DRAWN AEROBIC AND ANAEROBIC Blood Culture results may not be optimal due to an inadequate volume of blood received in culture bottles   Culture  Setup Time   Final    GRAM POSITIVE COCCI IN CLUSTERS IN BOTH AEROBIC AND ANAEROBIC BOTTLES CRITICAL RESULT CALLED TO, READ BACK BY AND VERIFIED WITH: PHARMD J ZHOU [id number] AT 1027 AM BY CM    Culture (A)  Final    STAPHYLOCOCCUS CAPITIS STAPHYLOCOCCUS HOMINIS CULTURE REINCUBATED FOR BETTER  GROWTH Performed at [facility], [address] [place], [place] [address]    Report Status PENDING  Incomplete  Blood Culture ID Panel (Reflexed)     Status: Abnormal   Collection Time: 08/06/23  4:38 PM  Result Value Ref Range Status   Enterococcus faecalis NOT DETECTED NOT DETECTED Final   Enterococcus Faecium NOT DETECTED NOT DETECTED Final   Listeria monocytogenes NOT DETECTED NOT DETECTED Final   Staphylococcus species DETECTED (A) NOT DETECTED Final    Comment: CRITICAL RESULT CALLED TO, READ BACK BY AND VERIFIED WITH: PHARMD J ZHOU [id number] AT 1017 AM BY CM    Staphylococcus aureus (BCID) NOT DETECTED NOT DETECTED Final   Staphylococcus epidermidis DETECTED (A) NOT DETECTED Final    Comment: Methicillin (oxacillin) resistant coagulase negative staphylococcus. Possible blood culture contaminant (unless isolated from more than one blood culture draw or clinical case suggests pathogenicity). No antibiotic treatment is indicated for  blood  culture contaminants. CRITICAL RESULT CALLED TO, READ BACK BY AND VERIFIED WITH: PHARMD J ZHOU [id number] AT 1027 AM BY CM    Staphylococcus lugdunensis NOT DETECTED NOT DETECTED Final   Streptococcus species NOT DETECTED NOT DETECTED Final   Streptococcus agalactiae NOT DETECTED NOT DETECTED Final   Streptococcus pneumoniae NOT DETECTED NOT DETECTED Final   Streptococcus pyogenes NOT DETECTED NOT DETECTED Final   A.calcoaceticus-baumannii NOT DETECTED NOT DETECTED Final   Bacteroides fragilis NOT DETECTED NOT DETECTED Final   Enterobacterales NOT DETECTED NOT DETECTED Final   Enterobacter cloacae complex NOT DETECTED NOT DETECTED Final   Escherichia coli NOT DETECTED NOT DETECTED Final   Klebsiella aerogenes NOT DETECTED NOT DETECTED Final   Klebsiella oxytoca NOT DETECTED NOT DETECTED Final   Klebsiella pneumoniae NOT DETECTED NOT DETECTED Final   Proteus species NOT DETECTED NOT DETECTED Final   Salmonella species NOT DETECTED NOT  DETECTED Final   Serratia marcescens NOT DETECTED NOT DETECTED Final   Haemophilus influenzae NOT DETECTED NOT DETECTED Final   Neisseria meningitidis NOT DETECTED NOT DETECTED Final   Pseudomonas aeruginosa NOT DETECTED NOT DETECTED Final   Stenotrophomonas maltophilia NOT DETECTED NOT DETECTED Final   Candida albicans NOT DETECTED NOT DETECTED Final   Candida auris NOT DETECTED NOT DETECTED Final   Candida glabrata NOT DETECTED NOT DETECTED Final   Candida krusei NOT DETECTED NOT DETECTED Final   Candida parapsilosis NOT DETECTED NOT DETECTED Final   Candida tropicalis NOT DETECTED NOT DETECTED Final   Cryptococcus neoformans/gattii NOT DETECTED NOT DETECTED Final   Methicillin resistance mecA/C DETECTED (A) NOT DETECTED Final    Comment: CRITICAL RESULT CALLED TO, READ BACK BY AND VERIFIED WITH: PHARMD J ZHOU [id number] AT 1027 AM BY CM Performed at [facility], [address], [place], [place] [address]   Culture, blood (Routine X 2) w Reflex to ID Panel     Status: None (Preliminary result)   Collection Time: 08/06/23  5:17 PM   Specimen: BLOOD RIGHT ARM  Result Value Ref Range Status   Specimen Description BLOOD RIGHT ARM  Final   Special Requests   Final    BOTTLES DRAWN AEROBIC ONLY Blood Culture results may not be optimal due to an inadequate volume of blood received in culture bottles   Culture   Final    NO GROWTH 3 DAYS Performed at [facility], [address] [place], [place] [address]    Report Status PENDING  Incomplete  Resp panel by RT-PCR (RSV, Flu A&B, Covid) Anterior Nasal Swab     Status: None   Collection Time: 08/06/23  5:50 PM   Specimen: Anterior Nasal Swab  Result Value Ref Range Status   SARS Coronavirus 2 by RT PCR NEGATIVE NEGATIVE Final   Influenza A by PCR NEGATIVE NEGATIVE Final   Influenza B by PCR NEGATIVE NEGATIVE Final    Comment: (NOTE) The Xpert Xpress SARS-CoV-2/FLU/RSV plus assay is intended as an aid in the diagnosis of  influenza from Nasopharyngeal swab specimens and should not be used as a sole basis for treatment. Nasal washings and aspirates are unacceptable for Xpert Xpress SARS-CoV-2/FLU/RSV testing.  Fact Sheet for Patients: BloggerCourse.com  Fact Sheet for Healthcare Providers: SeriousBroker.it  This test is not yet approved or cleared by the [place] FDA and has been authorized for detection and/or diagnosis of SARS-CoV-2 by FDA under an Emergency Use Authorization (EUA). This EUA will remain in effect (meaning this test can be used)  for the duration of the COVID-19 declaration under Section 564(b)(1) of the Act, 21 U.S.C. section 360bbb-3(b)(1), unless the authorization is terminated or revoked.     Resp Syncytial Virus by PCR NEGATIVE NEGATIVE Final    Comment: (NOTE) Fact Sheet for Patients: BloggerCourse.com  Fact Sheet for Healthcare Providers: SeriousBroker.it  This test is not yet approved or cleared by the [place] FDA and has been authorized for detection and/or diagnosis of SARS-CoV-2 by FDA under an Emergency Use Authorization (EUA). This EUA will remain in effect (meaning this test can be used) for the duration of the COVID-19 declaration under Section 564(b)(1) of the Act, 21 U.S.C. section 360bbb-3(b)(1), unless the authorization is terminated or revoked.  Performed at [facility], [address] [place], [place] [address]   Urine Culture     Status: None   Collection Time: 08/06/23  7:44 PM   Specimen: Urine, Clean Catch  Result Value Ref Range Status   Specimen Description URINE, CLEAN CATCH  Final   Special Requests NONE Reflexed from [id number]  Final   Culture   Final    NO GROWTH Performed at [facility], [address], [place], [place] [address]    Report Status 08/08/2023 FINAL  Final     Labs: BNP (last 3 results) Recent Labs     10/12/22 0437  BNP 16.8   Basic Metabolic Panel: Recent Labs  Lab 08/06/23 1857 08/07/23 0502 08/07/23 2131 08/08/23 0403 08/09/23 0422  NA 138  141 140 135 136 137  K 3.9  4.0 3.7 4.0 3.6 4.0  CL 106  110 108 104 104 105  CO2 22 22 24  21* 23  GLUCOSE 142*  134* 118* 182* 114* 146*  BUN 15  14 12 15 13 14   CREATININE 1.02  1.00 1.01 1.13 1.61* 1.11  CALCIUM 9.5 9.7 9.2 9.8 9.1  MG 1.6*  --  1.6*  --  1.8  PHOS  --   --   --  2.0*  --    Liver Function Tests: Recent Labs  Lab 08/06/23 1857 08/07/23 0502  AST 14* 16  ALT 11 11  ALKPHOS 90 92  BILITOT 1.0 1.4*  PROT 6.5 6.6  ALBUMIN 3.1* 3.1*   No results for input(s): "LIPASE", "AMYLASE" in the last 168 hours. Recent Labs  Lab 08/07/23 0502  AMMONIA 23   CBC: Recent Labs  Lab 08/06/23 1717 08/06/23 1857 08/07/23 0502 08/08/23 0403 08/09/23 0422  WBC 5.4  --  9.9 6.7 4.8  NEUTROABS 4.3  --   --   --  3.4  HGB 8.8* 12.2* 12.4* 12.0* 11.0*  HCT 27.5* 36.0* 36.8* 35.0* 32.9*  MCV 101.9*  --  95.6 94.9 95.4  PLT 91*  --  218 208 176   Cardiac Enzymes: No results for input(s): "CKTOTAL", "CKMB", "CKMBINDEX", "TROPONINI" in the last 168 hours. BNP: Invalid input(s): "POCBNP" CBG: No results for input(s): "GLUCAP" in the last 168 hours. D-Dimer No results for input(s): "DDIMER" in the last 72 hours. Hgb A1c No results for input(s): "HGBA1C" in the last 72 hours. Lipid Profile No results for input(s): "CHOL", "HDL", "LDLCALC", "TRIG", "CHOLHDL", "LDLDIRECT" in the last 72 hours. Thyroid function studies Recent Labs    08/06/23 1857  TSH 2.544   Anemia work up Recent Labs    08/07/23 0502 08/07/23 0509  VITAMINB12  --  661  FOLATE 37.5  --   FERRITIN 33  --   TIBC 237*  --  IRON 100  --   RETICCTPCT 1.5  --    Urinalysis    Component Value Date/Time   COLORURINE YELLOW 08/06/2023 1944   APPEARANCEUR CLEAR 08/06/2023 1944   LABSPEC 1.010 08/06/2023 1944   PHURINE 5.0 08/06/2023  1944   GLUCOSEU NEGATIVE 08/06/2023 1944   HGBUR MODERATE (A) 08/06/2023 1944   BILIRUBINUR NEGATIVE 08/06/2023 1944   KETONESUR 5 (A) 08/06/2023 1944   PROTEINUR NEGATIVE 08/06/2023 1944   UROBILINOGEN 0.2 10/25/2014 1520   NITRITE NEGATIVE 08/06/2023 1944   LEUKOCYTESUR SMALL (A) 08/06/2023 1944   Sepsis Labs Recent Labs  Lab 08/06/23 1717 08/07/23 0502 08/08/23 0403 08/09/23 0422  WBC 5.4 9.9 6.7 4.8   Microbiology Recent Results (from the past 240 hours)  Blood Culture (routine x 2)     Status: Abnormal (Preliminary result)   Collection Time: 08/06/23  4:38 PM   Specimen: BLOOD  Result Value Ref Range Status   Specimen Description BLOOD SITE NOT SPECIFIED  Final   Special Requests   Final    BOTTLES DRAWN AEROBIC AND ANAEROBIC Blood Culture results may not be optimal due to an inadequate volume of blood received in culture bottles   Culture  Setup Time   Final    GRAM POSITIVE COCCI IN CLUSTERS IN BOTH AEROBIC AND ANAEROBIC BOTTLES CRITICAL RESULT CALLED TO, READ BACK BY AND VERIFIED WITH: PHARMD J ZHOU [id number] AT 1027 AM BY CM    Culture (A)  Final    STAPHYLOCOCCUS CAPITIS STAPHYLOCOCCUS HOMINIS CULTURE REINCUBATED FOR BETTER GROWTH Performed at [facility], [address] [place], [place] [address]    Report Status PENDING  Incomplete  Blood Culture ID Panel (Reflexed)     Status: Abnormal   Collection Time: 08/06/23  4:38 PM  Result Value Ref Range Status   Enterococcus faecalis NOT DETECTED NOT DETECTED Final   Enterococcus Faecium NOT DETECTED NOT DETECTED Final   Listeria monocytogenes NOT DETECTED NOT DETECTED Final   Staphylococcus species DETECTED (A) NOT DETECTED Final    Comment: CRITICAL RESULT CALLED TO, READ BACK BY AND VERIFIED WITH: PHARMD J ZHOU [id number] AT 1017 AM BY CM    Staphylococcus aureus (BCID) NOT DETECTED NOT DETECTED Final   Staphylococcus epidermidis DETECTED (A) NOT DETECTED Final    Comment: Methicillin (oxacillin) resistant  coagulase negative staphylococcus. Possible blood culture contaminant (unless isolated from more than one blood culture draw or clinical case suggests pathogenicity). No antibiotic treatment is indicated for blood  culture contaminants. CRITICAL RESULT CALLED TO, READ BACK BY AND VERIFIED WITH: PHARMD J ZHOU [id number] AT 1027 AM BY CM    Staphylococcus lugdunensis NOT DETECTED NOT DETECTED Final   Streptococcus species NOT DETECTED NOT DETECTED Final   Streptococcus agalactiae NOT DETECTED NOT DETECTED Final   Streptococcus pneumoniae NOT DETECTED NOT DETECTED Final   Streptococcus pyogenes NOT DETECTED NOT DETECTED Final   A.calcoaceticus-baumannii NOT DETECTED NOT DETECTED Final   Bacteroides fragilis NOT DETECTED NOT DETECTED Final   Enterobacterales NOT DETECTED NOT DETECTED Final   Enterobacter cloacae complex NOT DETECTED NOT DETECTED Final   Escherichia coli NOT DETECTED NOT DETECTED Final   Klebsiella aerogenes NOT DETECTED NOT DETECTED Final   Klebsiella oxytoca NOT DETECTED NOT DETECTED Final   Klebsiella pneumoniae NOT DETECTED NOT DETECTED Final   Proteus species NOT DETECTED NOT DETECTED Final   Salmonella species NOT DETECTED NOT DETECTED Final   Serratia marcescens NOT DETECTED NOT DETECTED Final   Haemophilus influenzae NOT DETECTED NOT DETECTED  Final   Neisseria meningitidis NOT DETECTED NOT DETECTED Final   Pseudomonas aeruginosa NOT DETECTED NOT DETECTED Final   Stenotrophomonas maltophilia NOT DETECTED NOT DETECTED Final   Candida albicans NOT DETECTED NOT DETECTED Final   Candida auris NOT DETECTED NOT DETECTED Final   Candida glabrata NOT DETECTED NOT DETECTED Final   Candida krusei NOT DETECTED NOT DETECTED Final   Candida parapsilosis NOT DETECTED NOT DETECTED Final   Candida tropicalis NOT DETECTED NOT DETECTED Final   Cryptococcus neoformans/gattii NOT DETECTED NOT DETECTED Final   Methicillin resistance mecA/C DETECTED (A) NOT DETECTED Final    Comment:  CRITICAL RESULT CALLED TO, READ BACK BY AND VERIFIED WITH: PHARMD J ZHOU [id number] AT 1027 AM BY CM Performed at [facility], [address] [place], [place] [address]   Culture, blood (Routine X 2) w Reflex to ID Panel     Status: None (Preliminary result)   Collection Time: 08/06/23  5:17 PM   Specimen: BLOOD RIGHT ARM  Result Value Ref Range Status   Specimen Description BLOOD RIGHT ARM  Final   Special Requests   Final    BOTTLES DRAWN AEROBIC ONLY Blood Culture results may not be optimal due to an inadequate volume of blood received in culture bottles   Culture   Final    NO GROWTH 3 DAYS Performed at [facility], [address], [place], [place] [address]    Report Status PENDING  Incomplete  Resp panel by RT-PCR (RSV, Flu A&B, Covid) Anterior Nasal Swab     Status: None   Collection Time: 08/06/23  5:50 PM   Specimen: Anterior Nasal Swab  Result Value Ref Range Status   SARS Coronavirus 2 by RT PCR NEGATIVE NEGATIVE Final   Influenza A by PCR NEGATIVE NEGATIVE Final   Influenza B by PCR NEGATIVE NEGATIVE Final    Comment: (NOTE) The Xpert Xpress SARS-CoV-2/FLU/RSV plus assay is intended as an aid in the diagnosis of influenza from Nasopharyngeal swab specimens and should not be used as a sole basis for treatment. Nasal washings and aspirates are unacceptable for Xpert Xpress SARS-CoV-2/FLU/RSV testing.  Fact Sheet for Patients: BloggerCourse.com  Fact Sheet for Healthcare Providers: SeriousBroker.it  This test is not yet approved or cleared by the [place] FDA and has been authorized for detection and/or diagnosis of SARS-CoV-2 by FDA under an Emergency Use Authorization (EUA). This EUA will remain in effect (meaning this test can be used) for the duration of the COVID-19 declaration under Section 564(b)(1) of the Act, 21 U.S.C. section 360bbb-3(b)(1), unless the authorization is terminated  or revoked.     Resp Syncytial Virus by PCR NEGATIVE NEGATIVE Final    Comment: (NOTE) Fact Sheet for Patients: BloggerCourse.com  Fact Sheet for Healthcare Providers: SeriousBroker.it  This test is not yet approved or cleared by the [place] FDA and has been authorized for detection and/or diagnosis of SARS-CoV-2 by FDA under an Emergency Use Authorization (EUA). This EUA will remain in effect (meaning this test can be used) for the duration of the COVID-19 declaration under Section 564(b)(1) of the Act, 21 U.S.C. section 360bbb-3(b)(1), unless the authorization is terminated or revoked.  Performed at [facility], [address], [place], [place] [address]   Urine Culture     Status: None   Collection Time: 08/06/23  7:44 PM   Specimen: Urine, Clean Catch  Result Value Ref Range Status   Specimen Description URINE, CLEAN CATCH  Final  Special Requests NONE Reflexed from [id number]  Final   Culture   Final    NO GROWTH Performed at [facility], [address] [place], [place] [address]    Report Status 08/08/2023 FINAL  Final     Time coordinating discharge: 35 minutes  SIGNED:   Glade Lloyd, MD  [facility] 08/09/2023, 9:07 AM

## 2023-08-09 NOTE — Progress Notes (Signed)
 OT Cancellation Note  Patient Details Name: Mike Mack MRN: [id number] DOB: 12-09-1935   Cancelled Treatment:    Reason Eval/Treat Not Completed: OT screened, no needs identified, will sign off ([place] care at baseline. No acute OT needs.)  [facility] 08/09/2023, 6:30 AM Luisa Dago, OT/L   Acute OT Clinical Specialist Acute Rehabilitation Services Pager [phone] Office [phone]

## 2023-08-09 NOTE — TOC Transition Note (Signed)
 Transition of Care [facility]) - Discharge Note   Patient Details  Name: Mike Mack MRN: [id number] Date of Birth: 1936/01/09  Transition of Care [facility]) CM/SW Contact:  [name], [name], RN Phone Number: 08/09/2023, 11:16 AM   Clinical Narrative:     Patient is scheduled for discharge today.  Readmission Risk Assessment done. Outpatient f/u, hospital f/u and discharge instructions on AVS. Prescriptions sent to [facility] pharmacy and patient will receive meds prior discharge. [facility] scheduled to transport at discharge.  No further TOC needs noted.          Final next level of care: Home/Self Care Barriers to Discharge: Barriers Resolved   Patient Goals and CMS Choice Patient states their goals for this hospitalization and ongoing recovery are:: To return home CMS Medicare.gov Compare Post Acute Care list provided to:: Patient Choice offered to / list presented to : Patient, Spouse      Discharge Placement                Patient to be transferred to facility by: PTAR      Discharge Plan and Services Additional resources added to the After Visit Summary for                  DME Arranged: N/A DME Agency: NA       HH Arranged: NA HH Agency: NA        Social Drivers of Health (SDOH) Interventions SDOH Screenings   Food Insecurity: No Food Insecurity (08/08/2023)  Housing: Low Risk  (08/08/2023)  Transportation Needs: No Transportation Needs (08/08/2023)  Utilities: Not At Risk (08/08/2023)  Social Connections: Patient Unable To Answer (08/08/2023)  Tobacco Use: Low Risk  (08/06/2023)     Readmission Risk Interventions    08/08/2023    4:02 PM 09/04/2022   10:29 AM 08/24/2022   10:47 AM  Readmission Risk Prevention Plan  Transportation Screening Complete Complete Complete  PCP or Specialist Appt within 3-5 Days Complete  Complete  HRI or Home Care Consult Complete  Complete  Social Work Consult for Recovery Care Planning/Counseling Complete   Complete  Palliative Care Screening Not Applicable  Not Applicable  Medication Review Oceanographer) Referral to Pharmacy Complete Referral to Pharmacy  PCP or Specialist appointment within 3-5 days of discharge  Complete   HRI or Home Care Consult  Complete   SW Recovery Care/Counseling Consult  Complete   Palliative Care Screening  Not Applicable   Skilled Nursing Facility  Not Applicable

## 2023-08-09 NOTE — Progress Notes (Addendum)
 AVS printed and in folder for transport  Reviewed AVS with family.  Questrions answered

## 2023-08-10 ENCOUNTER — Telehealth: Payer: Self-pay

## 2023-08-10 LAB — CULTURE, BLOOD (ROUTINE X 2)

## 2023-08-10 NOTE — Transitions of Care (Post Inpatient/ED Visit) (Signed)
 08/10/2023  Name: Mike Mack MRN: [id number] DOB: 19-Oct-1935  Today's TOC FU Call Status: Today's TOC FU Call Status:: Successful TOC FU Call Completed TOC FU Call Complete Date: 08/10/23 Patient's Name and Date of Birth confirmed.  Transition Care Management Follow-up Telephone Call Date of Discharge: 08/09/23 Discharge Facility: [facility]) Type of Discharge: Inpatient Admission Primary Inpatient Discharge Diagnosis:: Urinary Tract Infection How have you been since you were released from the hospital?: Better Any questions or concerns?: No  Items Reviewed: Did you receive and understand the discharge instructions provided?: Yes Medications obtained,verified, and reconciled?: Yes (Medications Reviewed) Any new allergies since your discharge?: No Dietary orders reviewed?: Yes Type of Diet Ordered:: Low salt, heart healthy Do you have support at home?: Yes People in Home: spouse, child(ren), adult Name of Support/Comfort Primary Source: Dorothy  Medications Reviewed Today: Medications Reviewed Today     Reviewed by Wyline Mood, RN (Case Manager) on 08/10/23 at 1509  Med List Status: <None>   Medication Order Taking? Sig Documenting Provider Last Dose Status Informant  acetaminophen (TYLENOL) 325 MG tablet [id number] Yes Take 2 tablets (650 mg total) by mouth every 6 (six) hours as needed for mild pain (or Fever >/= 101). Calvert Cantor, MD Taking Active Spouse/Significant Other  amLODipine (NORVASC) 2.5 MG tablet [id number] Yes Take 1 tablet (2.5 mg total) by mouth daily. Azucena Fallen, MD Taking Active Spouse/Significant Other  aspirin EC 81 MG tablet [id number] Yes Take 1 tablet (81 mg total) by mouth daily. Swallow whole. Azucena Fallen, MD Taking Active Spouse/Significant Other  atorvastatin (LIPITOR) 20 MG tablet [id number] Yes Take 20 mg by mouth every evening.  [provider] Taking Active Spouse/Significant Other           Med Note  Janee Morn, AMY C   Sat Nov 08, 2013  6:52 AM) .  B Complex Vitamins (B COMPLEX PO) [id number] Yes Take 1 tablet by mouth at bedtime. [provider] Taking Active Spouse/Significant Other  brimonidine (ALPHAGAN) 0.2 % ophthalmic solution [id number] Yes Place 1 drop into both eyes 2 (two) times daily. [provider] Taking Active Spouse/Significant Other  cefadroxil (DURICEF) 500 MG capsule [id number] Yes Take 1 capsule (500 mg total) by mouth 2 (two) times daily for 3 days. Glade Lloyd, MD Taking Active   cholecalciferol (VITAMIN D3) 10 MCG (400 UNIT) TABS tablet [id number] Yes Take 400 Units by mouth at bedtime. [provider] Taking Active Spouse/Significant Other  dorzolamide-timolol (COSOPT) 22.3-6.8 MG/ML ophthalmic solution [id number] Yes Place 1 drop into both eyes 2 (two) times daily. [provider] Taking Active Spouse/Significant Other  Finerenone (KERENDIA) 10 MG TABS [id number] Yes Take 10 mg by mouth daily. [provider] Taking Active Spouse/Significant Other  latanoprost (XALATAN) 0.005 % ophthalmic solution [id number] Yes Place 1 drop into both eyes at bedtime. [provider] Taking Active Spouse/Significant Other  levothyroxine (SYNTHROID) 50 MCG tablet [id number] Yes Take 1 tablet (50 mcg total) by mouth daily at 6 (six) AM. Herley Sea, MD Taking Active Spouse/Significant Other  memantine (NAMENDA) 10 MG tablet [id number] Yes Take 10 mg by mouth in the morning and at bedtime. [provider] Taking Active Spouse/Significant Other  pantoprazole (PROTONIX) 40 MG tablet [id number] Yes Take 1 tablet (40 mg total) by mouth daily. Peter Garter, PA Taking Active Spouse/Significant Other  polyethylene glycol (MIRALAX / GLYCOLAX) 17 g packet [id number] Yes Take 17 g by mouth daily. Rodolph Bong, MD Taking Active Spouse/Significant Other  RHOPRESSA  0.02 % SOLN [id number] Yes Place 1 drop into both eyes at bedtime.  [provider] Taking Active Spouse/Significant Other  Tamsulosin HCl (FLOMAX) 0.4 MG CAPS [id number] Yes Take 0.4 mg by mouth at bedtime. [provider] Taking Active Spouse/Significant Other            Home Care and Equipment/Supplies: Were Home Health Services Ordered?: NA Any new equipment or medical supplies ordered?: NA  Functional Questionnaire: Do you need assistance with bathing/showering or dressing?: Yes (Per spouse, patient's functional baseline is total assist with mobility and activities of daily living; has been non-ambulatory x 3 years; grandson assist with transfers from bed to wheelchair on the occassions he is out of bed.) Do you need assistance with meal preparation?: Yes Do you need assistance with eating?: Yes Do you have difficulty maintaining continence: Yes Do you need assistance with getting out of bed/getting out of a chair/moving?: Yes Do you have difficulty managing or taking your medications?: Yes  Follow up appointments reviewed: PCP Follow-up appointment confirmed?: No MD Provider Line [phone] Given: No Specialist Hospital Follow-up appointment confirmed?: NA Do you need transportation to your follow-up appointment?: No (Per spouse, patient is receiving hospice at home services with [facility] and assist with transports needs as needed.) Do you understand care options if your condition(s) worsen?: Yes-patient verbalized understanding  SDOH Interventions Today    Flowsheet Row Most Recent Value  SDOH Interventions   Food Insecurity Interventions Intervention Not Indicated  Housing Interventions Intervention Not Indicated  Transportation Interventions [facility]), Facilities manager under hospice care at home with [facility].]  Utilities Interventions Intervention Not Indicated      Interventions Today    Flowsheet Row Most Recent Value  Chronic Disease   Chronic disease  during today's visit Other  [Urinary Tract Infection]  General Interventions   General Interventions Discussed/Reviewed [facility], Doctor Visits, Level of Care  Doctor Visits Discussed/Reviewed Doctor Visits Discussed, Doctor Visits Reviewed, PCP  Level of Care --  [[facility] at home]  Education Interventions   Education Provided Provided Education  Provided Verbal Education On Nutrition, Medication, When to see the doctor, Other  [Patient's spouse indicates she has spoken with the nurse from [facility] services and they are aware that the patient is home.]  Nutrition Interventions   Nutrition Discussed/Reviewed Fluid intake  Pharmacy Interventions   Pharmacy Dicussed/Reviewed Medications and their functions  Safety Interventions   Safety Discussed/Reviewed Fall Risk, Safety Discussed, Safety Reviewed  Advanced Directive Interventions   Advanced Directives Discussed/Reviewed --  [Patient receiving [facility] at Home.]       TOC Interventions Today    Flowsheet Row Most Recent Value  TOC Interventions   TOC Interventions Discussed/Reviewed Post discharge activity limitations per provider, S/S of infection, TOC Interventions Discussed      Patient not appropriate for [facility] program; currently receiving hospice at home services from [facility].  Wyline Mood BSN, RN Medical illustrator, Transition of Care [facility], Population Health Direct Dial:  [phone] / Fax: [phone] Email:  [email or web address] Website: [email or web address]

## 2023-08-11 LAB — CULTURE, BLOOD (ROUTINE X 2): Culture: NO GROWTH

## 2023-09-07 DIAGNOSIS — N1832 Chronic kidney disease, stage 3b: Secondary | ICD-10-CM | POA: Diagnosis not present

## 2023-09-07 DIAGNOSIS — I69341 Monoplegia of lower limb following cerebral infarction affecting right dominant side: Secondary | ICD-10-CM | POA: Diagnosis not present

## 2023-09-07 DIAGNOSIS — H409 Unspecified glaucoma: Secondary | ICD-10-CM | POA: Diagnosis not present

## 2023-09-07 DIAGNOSIS — E1122 Type 2 diabetes mellitus with diabetic chronic kidney disease: Secondary | ICD-10-CM | POA: Diagnosis not present

## 2023-09-07 DIAGNOSIS — I129 Hypertensive chronic kidney disease with stage 1 through stage 4 chronic kidney disease, or unspecified chronic kidney disease: Secondary | ICD-10-CM | POA: Diagnosis not present

## 2023-09-07 DIAGNOSIS — E1139 Type 2 diabetes mellitus with other diabetic ophthalmic complication: Secondary | ICD-10-CM | POA: Diagnosis not present

## 2023-09-07 DIAGNOSIS — F03C11 Unspecified dementia, severe, with agitation: Secondary | ICD-10-CM | POA: Diagnosis not present

## 2023-09-07 DIAGNOSIS — N401 Enlarged prostate with lower urinary tract symptoms: Secondary | ICD-10-CM | POA: Diagnosis not present

## 2023-09-07 DIAGNOSIS — R32 Unspecified urinary incontinence: Secondary | ICD-10-CM | POA: Diagnosis not present

## 2024-02-23 ENCOUNTER — Emergency Department (HOSPITAL_COMMUNITY)

## 2024-02-23 ENCOUNTER — Observation Stay (HOSPITAL_COMMUNITY)

## 2024-02-23 ENCOUNTER — Inpatient Hospital Stay (HOSPITAL_COMMUNITY)
Admission: EM | Admit: 2024-02-23 | Discharge: 2024-03-02 | DRG: 872 | Disposition: A | Discharge status: Home / Self Care | Attending: Infectious Diseases | Admitting: Infectious Diseases

## 2024-02-23 ENCOUNTER — Other Ambulatory Visit: Payer: Self-pay

## 2024-02-23 ENCOUNTER — Encounter (HOSPITAL_COMMUNITY): Payer: Self-pay

## 2024-02-23 DIAGNOSIS — H1033 Unspecified acute conjunctivitis, bilateral: Secondary | ICD-10-CM | POA: Diagnosis not present

## 2024-02-23 DIAGNOSIS — M85871 Other specified disorders of bone density and structure, right ankle and foot: Secondary | ICD-10-CM | POA: Diagnosis not present

## 2024-02-23 DIAGNOSIS — E119 Type 2 diabetes mellitus without complications: Secondary | ICD-10-CM | POA: Diagnosis present

## 2024-02-23 DIAGNOSIS — G9389 Other specified disorders of brain: Secondary | ICD-10-CM | POA: Diagnosis not present

## 2024-02-23 DIAGNOSIS — Z7982 Long term (current) use of aspirin: Secondary | ICD-10-CM | POA: Diagnosis not present

## 2024-02-23 DIAGNOSIS — F0390 Unspecified dementia without behavioral disturbance: Secondary | ICD-10-CM | POA: Diagnosis not present

## 2024-02-23 DIAGNOSIS — R482 Apraxia: Secondary | ICD-10-CM

## 2024-02-23 DIAGNOSIS — D539 Nutritional anemia, unspecified: Secondary | ICD-10-CM | POA: Diagnosis present

## 2024-02-23 DIAGNOSIS — Z993 Dependence on wheelchair: Secondary | ICD-10-CM

## 2024-02-23 DIAGNOSIS — B9683 Acinetobacter baumannii as the cause of diseases classified elsewhere: Secondary | ICD-10-CM | POA: Diagnosis not present

## 2024-02-23 DIAGNOSIS — Z833 Family history of diabetes mellitus: Secondary | ICD-10-CM

## 2024-02-23 DIAGNOSIS — Z66 Do not resuscitate: Secondary | ICD-10-CM | POA: Diagnosis present

## 2024-02-23 DIAGNOSIS — Z961 Presence of intraocular lens: Secondary | ICD-10-CM | POA: Diagnosis present

## 2024-02-23 DIAGNOSIS — Z886 Allergy status to analgesic agent status: Secondary | ICD-10-CM | POA: Diagnosis not present

## 2024-02-23 DIAGNOSIS — Z8249 Family history of ischemic heart disease and other diseases of the circulatory system: Secondary | ICD-10-CM | POA: Diagnosis not present

## 2024-02-23 DIAGNOSIS — Z888 Allergy status to other drugs, medicaments and biological substances status: Secondary | ICD-10-CM | POA: Diagnosis not present

## 2024-02-23 DIAGNOSIS — Z8612 Personal history of poliomyelitis: Secondary | ICD-10-CM | POA: Diagnosis not present

## 2024-02-23 DIAGNOSIS — Z8673 Personal history of transient ischemic attack (TIA), and cerebral infarction without residual deficits: Secondary | ICD-10-CM | POA: Diagnosis not present

## 2024-02-23 DIAGNOSIS — B3742 Candidal balanitis: Secondary | ICD-10-CM | POA: Diagnosis present

## 2024-02-23 DIAGNOSIS — H02003 Unspecified entropion of right eye, unspecified eyelid: Secondary | ICD-10-CM | POA: Diagnosis present

## 2024-02-23 DIAGNOSIS — B91 Sequelae of poliomyelitis: Secondary | ICD-10-CM

## 2024-02-23 DIAGNOSIS — N390 Urinary tract infection, site not specified: Secondary | ICD-10-CM | POA: Diagnosis present

## 2024-02-23 DIAGNOSIS — H109 Unspecified conjunctivitis: Secondary | ICD-10-CM | POA: Diagnosis not present

## 2024-02-23 DIAGNOSIS — R600 Localized edema: Secondary | ICD-10-CM | POA: Diagnosis not present

## 2024-02-23 DIAGNOSIS — B353 Tinea pedis: Secondary | ICD-10-CM | POA: Diagnosis present

## 2024-02-23 DIAGNOSIS — M79675 Pain in left toe(s): Secondary | ICD-10-CM | POA: Diagnosis present

## 2024-02-23 DIAGNOSIS — R0989 Other specified symptoms and signs involving the circulatory and respiratory systems: Secondary | ICD-10-CM | POA: Diagnosis not present

## 2024-02-23 DIAGNOSIS — Z79899 Other long term (current) drug therapy: Secondary | ICD-10-CM | POA: Diagnosis not present

## 2024-02-23 DIAGNOSIS — Z9841 Cataract extraction status, right eye: Secondary | ICD-10-CM

## 2024-02-23 DIAGNOSIS — E785 Hyperlipidemia, unspecified: Secondary | ICD-10-CM | POA: Diagnosis not present

## 2024-02-23 DIAGNOSIS — I63233 Cerebral infarction due to unspecified occlusion or stenosis of bilateral carotid arteries: Secondary | ICD-10-CM | POA: Diagnosis not present

## 2024-02-23 DIAGNOSIS — N481 Balanitis: Secondary | ICD-10-CM | POA: Diagnosis not present

## 2024-02-23 DIAGNOSIS — E039 Hypothyroidism, unspecified: Secondary | ICD-10-CM | POA: Diagnosis present

## 2024-02-23 DIAGNOSIS — Z9842 Cataract extraction status, left eye: Secondary | ICD-10-CM

## 2024-02-23 DIAGNOSIS — N400 Enlarged prostate without lower urinary tract symptoms: Secondary | ICD-10-CM | POA: Diagnosis not present

## 2024-02-23 DIAGNOSIS — L89891 Pressure ulcer of other site, stage 1: Secondary | ICD-10-CM | POA: Diagnosis present

## 2024-02-23 DIAGNOSIS — A498 Other bacterial infections of unspecified site: Secondary | ICD-10-CM

## 2024-02-23 DIAGNOSIS — H1089 Other conjunctivitis: Secondary | ICD-10-CM | POA: Diagnosis present

## 2024-02-23 DIAGNOSIS — A4154 Sepsis due to Acinetobacter baumannii: Principal | ICD-10-CM | POA: Diagnosis present

## 2024-02-23 DIAGNOSIS — B359 Dermatophytosis, unspecified: Secondary | ICD-10-CM | POA: Diagnosis not present

## 2024-02-23 DIAGNOSIS — M7989 Other specified soft tissue disorders: Secondary | ICD-10-CM | POA: Diagnosis not present

## 2024-02-23 DIAGNOSIS — H409 Unspecified glaucoma: Secondary | ICD-10-CM | POA: Diagnosis present

## 2024-02-23 DIAGNOSIS — Z792 Long term (current) use of antibiotics: Secondary | ICD-10-CM | POA: Diagnosis not present

## 2024-02-23 DIAGNOSIS — D649 Anemia, unspecified: Secondary | ICD-10-CM | POA: Diagnosis not present

## 2024-02-23 DIAGNOSIS — R4182 Altered mental status, unspecified: Secondary | ICD-10-CM | POA: Diagnosis not present

## 2024-02-23 DIAGNOSIS — N3000 Acute cystitis without hematuria: Secondary | ICD-10-CM | POA: Diagnosis not present

## 2024-02-23 DIAGNOSIS — Z79890 Hormone replacement therapy (postmenopausal): Secondary | ICD-10-CM

## 2024-02-23 DIAGNOSIS — R7881 Bacteremia: Secondary | ICD-10-CM | POA: Diagnosis present

## 2024-02-23 DIAGNOSIS — I10 Essential (primary) hypertension: Secondary | ICD-10-CM | POA: Diagnosis present

## 2024-02-23 DIAGNOSIS — B351 Tinea unguium: Secondary | ICD-10-CM | POA: Diagnosis not present

## 2024-02-23 DIAGNOSIS — R55 Syncope and collapse: Secondary | ICD-10-CM | POA: Diagnosis not present

## 2024-02-23 DIAGNOSIS — R531 Weakness: Secondary | ICD-10-CM | POA: Diagnosis not present

## 2024-02-23 DIAGNOSIS — Z789 Other specified health status: Secondary | ICD-10-CM | POA: Diagnosis present

## 2024-02-23 DIAGNOSIS — Z87440 Personal history of urinary (tract) infections: Secondary | ICD-10-CM

## 2024-02-23 DIAGNOSIS — Z7401 Bed confinement status: Secondary | ICD-10-CM | POA: Diagnosis not present

## 2024-02-23 DIAGNOSIS — E860 Dehydration: Secondary | ICD-10-CM | POA: Diagnosis present

## 2024-02-23 HISTORY — DX: Essential (primary) hypertension: I10

## 2024-02-23 LAB — URINALYSIS, ROUTINE W REFLEX MICROSCOPIC
Bilirubin Urine: NEGATIVE
Glucose, UA: NEGATIVE mg/dL
Ketones, ur: 5 mg/dL — AB
Nitrite: POSITIVE — AB
Protein, ur: 100 mg/dL — AB
RBC / HPF: >50 RBC/hpf (ref 0–5)
Specific Gravity, Urine: 1.011 (ref 1.005–1.030)
WBC, UA: >50 WBC/hpf (ref 0–5)
pH: 6 (ref 5.0–8.0)

## 2024-02-23 LAB — COMPREHENSIVE METABOLIC PANEL WITH GFR
ALT: 13 U/L (ref 0–44)
AST: 26 U/L (ref 15–41)
Albumin: 2.8 g/dL — ABNORMAL LOW (ref 3.5–5.0)
Alkaline Phosphatase: 89 U/L (ref 38–126)
Anion gap: 5 (ref 5–15)
BUN: 14 mg/dL (ref 8–23)
CO2: 25 mmol/L (ref 22–32)
Calcium: 8.3 mg/dL — ABNORMAL LOW (ref 8.9–10.3)
Chloride: 108 mmol/L (ref 98–111)
Creatinine, Ser: 0.97 mg/dL (ref 0.61–1.24)
GFR, Estimated: >60 mL/min (ref >60)
Glucose, Bld: 194 mg/dL — ABNORMAL HIGH (ref 70–99)
Potassium: 5.2 mmol/L — ABNORMAL HIGH (ref 3.5–5.1)
Sodium: 138 mmol/L (ref 135–145)
Total Bilirubin: 1.1 mg/dL (ref 0.0–1.2)
Total Protein: 5.7 g/dL — ABNORMAL LOW (ref 6.5–8.1)

## 2024-02-23 LAB — I-STAT CG4 LACTIC ACID, ED: Lactic Acid, Venous: 0.9 mmol/L (ref 0.5–1.9)

## 2024-02-23 LAB — CBC WITH DIFFERENTIAL/PLATELET
Abs Immature Granulocytes: 0.01 K/uL (ref 0.00–0.07)
Basophils Absolute: 0 K/uL (ref 0.0–0.1)
Basophils Relative: 1 %
Eosinophils Absolute: 0.1 K/uL (ref 0.0–0.5)
Eosinophils Relative: 1 %
HCT: 31.4 % — ABNORMAL LOW (ref 39.0–52.0)
Hemoglobin: 9.9 g/dL — ABNORMAL LOW (ref 13.0–17.0)
Immature Granulocytes: 0 %
Lymphocytes Relative: 21 %
Lymphs Abs: 1.3 K/uL (ref 0.7–4.0)
MCH: 34.9 pg — ABNORMAL HIGH (ref 26.0–34.0)
MCHC: 31.5 g/dL (ref 30.0–36.0)
MCV: 110.6 fL — ABNORMAL HIGH (ref 80.0–100.0)
Monocytes Absolute: 0.3 K/uL (ref 0.1–1.0)
Monocytes Relative: 6 %
Neutro Abs: 4.2 K/uL (ref 1.7–7.7)
Neutrophils Relative %: 71 %
Platelets: 166 K/uL (ref 150–400)
RBC: 2.84 MIL/uL — ABNORMAL LOW (ref 4.22–5.81)
RDW: 13.4 % (ref 11.5–15.5)
Smear Review: NORMAL
WBC: 5.9 K/uL (ref 4.0–10.5)
nRBC: 0 % (ref 0.0–0.2)

## 2024-02-23 LAB — RESP PANEL BY RT-PCR (RSV, FLU A&B, COVID)  RVPGX2
Influenza A by PCR: NEGATIVE
Influenza B by PCR: NEGATIVE
Resp Syncytial Virus by PCR: NEGATIVE
SARS Coronavirus 2 by RT PCR: NEGATIVE

## 2024-02-23 LAB — TROPONIN I (HIGH SENSITIVITY)
Troponin I (High Sensitivity): 5 ng/L (ref <18)
Troponin I (High Sensitivity): 5 ng/L (ref <18)

## 2024-02-23 LAB — MAGNESIUM: Magnesium: 1.7 mg/dL (ref 1.7–2.4)

## 2024-02-23 LAB — CBG MONITORING, ED: Glucose-Capillary: 169 mg/dL — ABNORMAL HIGH (ref 70–99)

## 2024-02-23 LAB — POC OCCULT BLOOD, ED: Fecal Occult Bld: NEGATIVE

## 2024-02-23 MED ORDER — TAMSULOSIN HCL 0.4 MG PO CAPS
0.4000 mg | ORAL_CAPSULE | Freq: Every day | ORAL | Status: DC
  Administered 2024-02-23 – 2024-03-01 (×8): 0.4 mg via ORAL
  Filled 2024-02-23 (×8): qty 1

## 2024-02-23 MED ORDER — ENOXAPARIN SODIUM 40 MG/0.4ML IJ SOSY
40.0000 mg | PREFILLED_SYRINGE | INTRAMUSCULAR | Status: DC
  Administered 2024-02-23 – 2024-03-01 (×8): 40 mg via SUBCUTANEOUS
  Filled 2024-02-23 (×8): qty 0.4

## 2024-02-23 MED ORDER — SODIUM CHLORIDE 0.9 % IV SOLN
1.0000 g | Freq: Once | INTRAVENOUS | Status: AC
  Administered 2024-02-23: 1 g via INTRAVENOUS
  Filled 2024-02-23: qty 10

## 2024-02-23 MED ORDER — POLYETHYLENE GLYCOL 3350 17 G PO PACK
17.0000 g | PACK | Freq: Every day | ORAL | Status: DC
  Administered 2024-02-24 – 2024-03-02 (×7): 17 g via ORAL
  Filled 2024-02-23 (×7): qty 1

## 2024-02-23 MED ORDER — SODIUM CHLORIDE 0.9 % IV SOLN
1.0000 g | Freq: Once | INTRAVENOUS | Status: DC
  Filled 2024-02-23: qty 10

## 2024-02-23 MED ORDER — SODIUM CHLORIDE 0.9 % IV BOLUS
500.0000 mL | Freq: Once | INTRAVENOUS | Status: AC
  Administered 2024-02-23: 500 mL via INTRAVENOUS

## 2024-02-23 MED ORDER — CLOTRIMAZOLE 1 % EX CREA
TOPICAL_CREAM | Freq: Two times a day (BID) | CUTANEOUS | Status: DC
  Filled 2024-02-23 (×2): qty 15

## 2024-02-23 MED ORDER — MEMANTINE HCL 10 MG PO TABS
10.0000 mg | ORAL_TABLET | Freq: Every day | ORAL | Status: DC
Start: 2024-02-23 — End: 2024-03-02
  Administered 2024-02-23 – 2024-03-02 (×9): 10 mg via ORAL
  Filled 2024-02-23 (×10): qty 1

## 2024-02-23 MED ORDER — PANTOPRAZOLE SODIUM 40 MG PO TBEC
40.0000 mg | DELAYED_RELEASE_TABLET | Freq: Every day | ORAL | Status: DC
  Administered 2024-02-24 – 2024-03-02 (×8): 40 mg via ORAL
  Filled 2024-02-23 (×8): qty 1

## 2024-02-23 MED ORDER — FINERENONE 10 MG PO TABS: 10.0000 mg | ORAL_TABLET | Freq: Every day | ORAL | Status: DC

## 2024-02-23 MED ORDER — ASPIRIN 81 MG PO TBEC
81.0000 mg | DELAYED_RELEASE_TABLET | Freq: Every day | ORAL | Status: DC
  Administered 2024-02-24 – 2024-03-02 (×8): 81 mg via ORAL
  Filled 2024-02-23 (×8): qty 1

## 2024-02-23 MED ORDER — CIPROFLOXACIN HCL 0.3 % OP SOLN
2.0000 [drp] | OPHTHALMIC | Status: DC
  Administered 2024-02-23 – 2024-03-02 (×66): 2 [drp] via OPHTHALMIC
  Filled 2024-02-23 (×5): qty 2.5

## 2024-02-23 MED ORDER — ATORVASTATIN CALCIUM 10 MG PO TABS
20.0000 mg | ORAL_TABLET | Freq: Every evening | ORAL | Status: DC
Start: 2024-02-23 — End: 2024-03-02
  Administered 2024-02-23 – 2024-03-01 (×8): 20 mg via ORAL
  Filled 2024-02-23 (×8): qty 2

## 2024-02-23 NOTE — ED Provider Notes (Signed)
 [name] [facility] AT [facility] Provider Note   CSN: [id number] Arrival date & time: 02/23/24  1101     Patient presents with: Weakness   Mike Mack is a 88 y.o. male.   Pt is a 88 yo male with pmhx significant for htn, hx sbo, hx polio as a child (RLE weakness), anemia, dm2, cva (residual left hand numbness), arthritis, kidney stones, hld, and dementia.  Pt's wife called EMS today saying that he is more weak than normal today.  Pt's ms is at baseline per EMS.  Pt does not complain of any pain.    Pt's wife said he had a syncopal event today while sitting at the breakfast table.  She said he was sitting there as normal, she left for a minute and when she came back, he was slumped over and unresponsive.  She then called EMS.  Pt has not been on any antibiotics recently for his bladder, just prn pyridium.  He's been on glaucoma eye drops, but no abx eye drops.       Prior to Admission medications   Medication Sig Start Date End Date Taking? Authorizing Provider  acetaminophen  (TYLENOL ) 325 MG tablet Take 2 tablets (650 mg total) by mouth every 6 (six) hours as needed for mild pain (or Fever >/= 101). 11/27/20   Mike Mack, Saima, MD  amLODipine  (NORVASC ) 2.5 MG tablet Take 1 tablet (2.5 mg total) by mouth daily. 08/11/22   Lue Mike Mack BROCKS, MD  aspirin  EC 81 MG tablet Take 1 tablet (81 mg total) by mouth daily. Swallow whole. 04/06/22   Lue Mike Mack BROCKS, MD  atorvastatin  (LIPITOR) 20 MG tablet Take 20 mg by mouth every evening.  11/03/13   [provider]  B Complex Vitamins (B COMPLEX PO) Take 1 tablet by mouth at bedtime.    [provider]  brimonidine  (ALPHAGAN ) 0.2 % ophthalmic solution Place 1 drop into both eyes 2 (two) times daily.    [provider]  cholecalciferol  (VITAMIN D3) 10 MCG (400 UNIT) TABS tablet Take 400 Units by mouth at bedtime.    [provider]  dorzolamide -timolol  (COSOPT ) 22.3-6.8 MG/ML  ophthalmic solution Place 1 drop into both eyes 2 (two) times daily. 03/04/20   [provider]  Finerenone  (KERENDIA ) 10 MG TABS Take 10 mg by mouth daily.    [provider]  latanoprost  (XALATAN ) 0.005 % ophthalmic solution Place 1 drop into both eyes at bedtime.    [provider]  levothyroxine  (SYNTHROID ) 50 MCG tablet Take 1 tablet (50 mcg total) by mouth daily at 6 (six) AM. 10/16/22   Mike Mike POUR, MD  memantine  (NAMENDA ) 10 MG tablet Take 10 mg by mouth in the morning and at bedtime. 11/04/20   [provider]  pantoprazole  (PROTONIX ) 40 MG tablet Take 1 tablet (40 mg total) by mouth daily. 07/08/22   Mike Mike LABOR, PA  polyethylene glycol (MIRALAX  / GLYCOLAX ) 17 g packet Take 17 g by mouth daily. 09/05/22   Mike Mike GAILS, MD  RHOPRESSA  0.02 % SOLN Place 1 drop into both eyes at bedtime. 03/05/20   [provider]  Tamsulosin  HCl (FLOMAX ) 0.4 MG CAPS Take 0.4 mg by mouth at bedtime.    [provider]    Allergies: Insulin  glargine, Insulin  glargine-lixisenatide, Metformin  and related, and Nsaids    Review of Systems  All other systems reviewed and are negative.   Updated Vital Signs BP (!) 175/77   Temp (!) 97.1  F (36.2 C) (Rectal)   Resp 15   SpO2 96%   Physical Exam Vitals and nursing note reviewed. Exam conducted with a chaperone present.  Constitutional:      Appearance: Normal appearance.  HENT:     Head: Normocephalic and atraumatic.     Right Ear: External ear normal.     Left Ear: External ear normal.     Nose: Nose normal.     Mouth/Throat:     Mouth: Mucous membranes are moist.     Pharynx: Oropharynx is clear.  Eyes:     Comments: Bilateral conjunctivitis with purulent discharge.    Cardiovascular:     Rate and Rhythm: Normal rate and regular rhythm.     Pulses: Normal pulses.     Heart sounds: Normal heart sounds.  Pulmonary:     Effort: Pulmonary effort is normal.     Breath sounds:  Normal breath sounds.  Abdominal:     General: Abdomen is flat. Bowel sounds are normal.     Palpations: Abdomen is soft.  Genitourinary:    Penis: Uncircumcised.      Rectum: Guaiac result negative.     Comments: Balanitis with fungal discharge under foreskin Musculoskeletal:        General: Normal range of motion.     Cervical back: Normal range of motion and neck supple.  Skin:    General: Skin is warm.     Capillary Refill: Capillary refill takes less than 2 seconds.  Neurological:     General: No focal deficit present.     Mental Status: He is alert. Mental status is at baseline.  Psychiatric:        Mood and Affect: Mood normal.        Behavior: Behavior normal.     (all labs ordered are listed, but only abnormal results are displayed) Labs Reviewed  CBC WITH DIFFERENTIAL/PLATELET - Abnormal; Notable for the following components:      Result Value   RBC 2.84 (*)    Hemoglobin 9.9 (*)    HCT 31.4 (*)    MCV 110.6 (*)    MCH 34.9 (*)    All other components within normal limits  COMPREHENSIVE METABOLIC PANEL WITH GFR - Abnormal; Notable for the following components:   Potassium 5.2 (*)    Glucose, Bld 194 (*)    Calcium  8.3 (*)    Total Protein 5.7 (*)    Albumin 2.8 (*)    All other components within normal limits  URINALYSIS, ROUTINE W REFLEX MICROSCOPIC - Abnormal; Notable for the following components:   Color, Urine AMBER (*)    APPearance CLOUDY (*)    Hgb urine dipstick MODERATE (*)    Ketones, ur 5 (*)    Protein, ur 100 (*)    Nitrite POSITIVE (*)    Leukocytes,Ua SMALL (*)    Bacteria, UA RARE (*)    All other components within normal limits  CBG MONITORING, ED - Abnormal; Notable for the following components:   Glucose-Capillary 169 (*)    All other components within normal limits  RESP PANEL BY RT-PCR (RSV, FLU A&B, COVID)  RVPGX2  URINE CULTURE  CULTURE, BLOOD (ROUTINE X 2)  CULTURE, BLOOD (ROUTINE X 2)  MAGNESIUM   I-STAT CG4 LACTIC ACID, ED   POC OCCULT BLOOD, ED  TROPONIN I (HIGH SENSITIVITY)    EKG: EKG Interpretation Date/Time:  Saturday February 23 2024 11:16:31 EDT Ventricular Rate:  67 PR Interval:  231 QRS Duration:  101 QT Interval:  405 QTC Calculation: 428 R Axis:   26  Text Interpretation: Sinus rhythm Prolonged PR interval Probable anteroseptal infarct, old No significant change since last tracing Confirmed by Dean Clarity [phone]) on 02/23/2024 12:06:33 PM  Radiology: CT Head Wo Contrast Result Date: 02/23/2024 CLINICAL DATA:  Altered mental status. EXAM: CT HEAD WITHOUT CONTRAST TECHNIQUE: Contiguous axial images were obtained from the base of the skull through the vertex without intravenous contrast. RADIATION DOSE REDUCTION: This exam was performed according to the departmental dose-optimization program which includes automated exposure control, adjustment of the mA and/or kV according to patient size and/or use of iterative reconstruction technique. COMPARISON:  August 06, 2023 FINDINGS: Brain: There is a generalized cerebral atrophy with widening of the extra-axial spaces and stable ventricular dilatation. There are areas of decreased attenuation within the white matter tracts of the supratentorial brain, consistent with microvascular disease changes. Small, chronic bilateral para thalamic lacunar infarcts are seen. Small, chronic bilateral cerebellar infarcts are also noted. Vascular: Moderate severity bilateral cavus carotid artery calcification is noted. Skull: Normal. Negative for fracture or focal lesion. Sinuses/Orbits: No acute finding. Other: None. IMPRESSION: 1. Generalized cerebral atrophy with stable ventricular dilatation. 2. Chronic bilateral para thalamic and bilateral cerebellar infarcts. 3. No acute intracranial abnormality. Electronically Signed   By: Suzen Dials M.D.   On: 02/23/2024 12:22   DG Chest Port 1 View Result Date: 02/23/2024 CLINICAL DATA:  Weakness. EXAM: PORTABLE CHEST 1 VIEW  COMPARISON:  August 06, 2023 FINDINGS: The heart size and mediastinal contours are within normal limits. Low lung volumes are noted. No acute infiltrate, pleural effusion or pneumothorax is identified. The visualized skeletal structures are unremarkable. IMPRESSION: No active disease. Electronically Signed   By: Suzen Dials M.D.   On: 02/23/2024 11:31     Procedures   Medications Ordered in the ED  ciprofloxacin  (CILOXAN ) 0.3 % ophthalmic solution 2 drop (has no administration in time range)  clotrimazole  (LOTRIMIN ) 1 % cream (has no administration in time range)  sodium chloride  0.9 % bolus 500 mL (500 mLs Intravenous New Bag/Given 02/23/24 1300)  cefTRIAXone  (ROCEPHIN ) 1 g in sodium chloride  0.9 % 100 mL IVPB (1 g Intravenous New Bag/Given 02/23/24 1310)                                    Medical Decision Making Amount and/or Complexity of Data Reviewed Labs: ordered. Radiology: ordered.  Risk Prescription drug management. Decision regarding hospitalization.   This patient presents to the ED for concern of weakness, this involves an extensive number of treatment options, and is a complaint that carries with it a high risk of complications and morbidity.  The differential diagnosis includes infection, dehydration, electrolyte abn, cva   Co morbidities that complicate the patient evaluation   htn, hx sbo, hx polio as a child (RLE weakness), anemia, dm2, cva (residual left hand numbness), arthritis, kidney stones, hld, and dementia   Additional history obtained:  Additional history obtained from epic chart review External records from outside source obtained and reviewed including EMS report and wife   Lab Tests:  I Ordered, and personally interpreted labs.  The pertinent results include:  cbc with hgb 9.9 (11.0 in Feb); cmp with k elevated at 5.2 (hemolysis), glucose elevated at 194; lactic nl; cath ua + uti; covid/flu/rsv neg   Imaging Studies ordered:  I ordered  imaging studies including ct head/cxr  I independently visualized and interpreted imaging which showed  CT head:  Generalized cerebral atrophy with stable ventricular dilatation.  2. Chronic bilateral para thalamic and bilateral cerebellar  infarcts.  3. No acute intracranial abnormality.  CXR: No active disease.  I agree with the radiologist interpretation   Cardiac Monitoring:  The patient was maintained on a cardiac monitor.  I personally viewed and interpreted the cardiac monitored which showed an underlying rhythm of: nsr   Medicines ordered and prescription drug management:  I ordered medication including ivfs/iv rocephin   for sx  Reevaluation of the patient after these medicines showed that the patient improved I have reviewed the patients home medicines and have made adjustments as needed   Test Considered:  ct   Critical Interventions:  abx   Consultations Obtained:  I requested consultation with the [facility]MT[facility],  and discussed lab and imaging findings as well as pertinent plan - they will admit   Problem List / ED Course:  Syncope:  possibly from infection.  Pt will need further testing. UTI:  likely due to being non-circumcised.  He is also non mobile and diaper dependent. Iv rocephin  ordered.  Cx ordered. Balanitis:  clotrimazole  ordered Conjunctivitis:  cipro  eye drops ordered   Reevaluation:  After the interventions noted above, I reevaluated the patient and found that they have :improved   Social Determinants of Health:  Lives at home   Dispostion:  After consideration of the diagnostic results and the patients response to treatment, I feel that the patent would benefit from admission.       Final diagnoses:  Acute bacterial conjunctivitis of both eyes  Balanitis  Syncope, unspecified syncope type  Acute cystitis without hematuria    ED Discharge Orders     None          Dean Clarity, MD 02/23/24 1411

## 2024-02-23 NOTE — ED Notes (Signed)
 [facility] called to inform nurse that pt is in transport.

## 2024-02-23 NOTE — Progress Notes (Signed)
 Noted his left foot bleeding upon assessment Xerofor\m dressing applied. Pictures taken. Md made aware.

## 2024-02-23 NOTE — ED Triage Notes (Signed)
 Pt bib [facility] from home for weakness. Wife noticed patient having weakness that started this morning around 1000. Pt wife reports that pt has had a uti for approximately 63mo. Pt reports no pain.  Hx: dementia, pt is at baseline   EMS VS  181/85 bp  60 hr  95% RA  97.2 CBG 230 20 g LAC

## 2024-02-23 NOTE — ED Notes (Signed)
 Blood draw attempted x2. Unsuccessful. Phlebotomy notified.

## 2024-02-23 NOTE — H&P (Cosign Needed)
 Date: 02/23/2024               Patient Name:  Mike Mack MRN: [id number]  DOB: October 05, 1935 Age / Sex: 88 y.o., male   PCP: Verdia Lombard, MD         Medical Service: Internal Medicine Teaching Service         Attending Physician: Dr. Mliss Pouch      First Contact: Rebecka Pion, DO}    Second Contact: Dr. Roetta Chars, MD          Pager Information: First Contact Pager: [phone]   Second Contact Pager: [phone]   SUBJECTIVE   Chief Complaint: Passing Out   History of Present Illness: Mike Mack is a 88 y.o. male with PMH of HLD, hypothyroidism, dementia, CVA, hx of polio with RLE weakness, BPH who was brought in by EMS today for passing out.  Patient was accompanied by his wife.  Wife mentioned that this morning she left the patient for a minute and on returning found him slouched over and unresponsive in his wheelchair.  Wife called EMS and and said EMS gave him oxygen and kept talking to him after which he was responsive and woke up.  She said patient had been passed out for 10 minutes.  Wife mentioned that similar episode happened this past February and patient was found to have a UTI then.  Wife also mentioned that patient has been treated for several incidents of UTI after the February episode that it seems to be a chronic issue.  Wife said patient did not complain about any burning or itchiness in his pelvic area.  He wears briefs.  Patient did not complain about pain or burning while passing urine and denied any abdominal pain.  Patient did not complain about any fevers or chills in the past few days.  She mentioned that he did not seem any different than before--did not have any changes in eating meals.  Wife also mentioned that patient was complaining about a sore toe but had no other acute complaints.  On talking to the patient, he was able to give his full name.  He could say that he was in [place], however, he was not able to confirm the location exactly and  said no when asked if he can tell us  what year it was.  Asked patient about today's episode and he stated that he blanked out.  Patient said he did not feel anything different before that and just blanked out.  Wife said that patient was currently back to his baseline mentation.    ED Course: Labs significant for: Glu capillary: 169 Hgb: 9.9 (11-12.4 baseline) MCV: 110.6  K: 5.2 Ca: 8.3  UA:  Hgb: moderate Ketones: 5 +ve nitrites and leukocytes  Imaging : CT head showed no acute intracranial abnormality; chest x-ray showed no acute infiltrate, pleural fusion, pneumothorax. Received Rocephin  and IV fluids Consulted IMTS  Meds:  Medications confirmed by wife: Amlodipine  2.5mg  : Discontinued as BP was low Aspirin  81mg   Atorvastatin  20mg   Synthroid   Memantine  10mg   Pantoprazole  40mg   Pyridium 100mg  BID  Flomax  .4mg  at bedtime  Miralax  every other day Current Meds  Medication Sig   acetaminophen  (TYLENOL ) 325 MG tablet Take 2 tablets (650 mg total) by mouth every 6 (six) hours as needed for mild pain (or Fever >/= 101).   aspirin  EC 81 MG tablet Take 1 tablet (81 mg total) by mouth daily. Swallow whole.   atorvastatin  (LIPITOR) 20 MG tablet Take  20 mg by mouth every evening.    B Complex Vitamins (B COMPLEX PO) Take 1 tablet by mouth at bedtime.   brimonidine  (ALPHAGAN ) 0.2 % ophthalmic solution Place 1 drop into both eyes 2 (two) times daily.   cholecalciferol  (VITAMIN D3) 10 MCG (400 UNIT) TABS tablet Take 400 Units by mouth at bedtime.   dorzolamide -timolol  (COSOPT ) 22.3-6.8 MG/ML ophthalmic solution Place 1 drop into both eyes 2 (two) times daily.   Finerenone  (KERENDIA ) 10 MG TABS Take 10 mg by mouth daily.   latanoprost  (XALATAN ) 0.005 % ophthalmic solution Place 1 drop into both eyes at bedtime.   levothyroxine  (SYNTHROID ) 50 MCG tablet Take 1 tablet (50 mcg total) by mouth daily at 6 (six) AM.   memantine  (NAMENDA ) 10 MG tablet Take 10 mg by mouth in the morning and  at bedtime.   pantoprazole  (PROTONIX ) 40 MG tablet Take 1 tablet (40 mg total) by mouth daily.   phenazopyridine (PYRIDIUM) 100 MG tablet Take 100 mg by mouth 2 (two) times daily.   polyethylene glycol (MIRALAX  / GLYCOLAX ) 17 g packet Take 17 g by mouth daily. (Patient taking differently: Take 17 g by mouth every other day.)   RHOPRESSA  0.02 % SOLN Place 1 drop into both eyes at bedtime.   Tamsulosin  HCl (FLOMAX ) 0.4 MG CAPS Take 0.4 mg by mouth at bedtime.    Past Medical History  HLD, hypothyroidism, dementia, CVA, hx of polio with RLE weakness, BPH  Past Surgical History Past Surgical History:  Procedure Laterality Date   BIOPSY  11/30/2020   Procedure: BIOPSY;  Surgeon: Saintclair Jasper, MD;  [facility] ENDOSCOPY;  Service: Gastroenterology;;   BIOPSY  03/03/2022   Procedure: BIOPSY;  Surgeon: Rosalie Kitchens, MD;  Location: [facility] ENDOSCOPY;  Service: Gastroenterology;;   BOWEL RESECTION     CATARACT EXTRACTION W/ INTRAOCULAR LENS  IMPLANT, BILATERAL Bilateral 2000's   CHOLECYSTECTOMY N/A 07/25/2013   Procedure: LAPAROSCOPIC CHOLECYSTECTOMY WITH INTRAOPERATIVE CHOLANGIOGRAM;  Surgeon: Elon CHRISTELLA Pacini, MD;  Location: [facility] OR;  Service: General;  Laterality: N/A;   COLON SURGERY     COLONOSCOPY     Hx: of   CYSTOSCOPY WITH BIOPSY N/A 12/01/2019   Procedure: CYSTOSCOPY WITH BLADDER BIOPSY AND FULGURATION;  Surgeon: Carolee Sherwood JONETTA DOUGLAS, MD;  Location: [facility] ORS;  Service: Urology;  Laterality: N/A;   ESOPHAGOGASTRODUODENOSCOPY N/A 11/30/2020   Procedure: ESOPHAGOGASTRODUODENOSCOPY (EGD);  Surgeon: Saintclair Jasper, MD;  Location: [facility] ENDOSCOPY;  Service: Gastroenterology;  Laterality: N/A;   ESOPHAGOGASTRODUODENOSCOPY N/A 03/03/2022   Procedure: ESOPHAGOGASTRODUODENOSCOPY (EGD);  Surgeon: Rosalie Kitchens, MD;  Location: [facility] ENDOSCOPY;  Service: Gastroenterology;  Laterality: N/A;   EXCISIONAL HEMORRHOIDECTOMY  2000's   EXTRACORPOREAL SHOCK WAVE LITHOTRIPSY Right 10/03/2018   Procedure: EXTRACORPOREAL SHOCK WAVE  LITHOTRIPSY (ESWL);  Surgeon: Nieves Cough, MD;  Location: [facility] ORS;  Service: Urology;  Laterality: Right;   EYE SURGERY     INGUINAL HERNIA REPAIR Right 1970's   IR RADIOLOGIST EVAL & MGMT  04/06/2020   LAPAROSCOPIC CHOLECYSTECTOMY  07/25/2013   w/LOA (07/25/2013)     Social:  Lives With: Wife and grandson at home Level of Function: Not mobile, but can use his hands. Mostly stays in bed because he's not mobile, eats his meals. Completely dependent in all ADLs/IADLs. Wife/Grandson takes care of him at home. [place] and and had injury in RLE last year after which he started using his wheelchair.  PCP: Verdia Lombard, MD  Substances: No hx of smoking tobacco, no aclohol, no marijuana.  Family History:  Family History  Problem Relation Age of Onset   Heart failure Mother    Hypertension Sister    Diabetes Sister    Diabetes Brother    Hypertension Brother      Allergies: Allergies as of 02/23/2024 - Review Complete 02/23/2024  Allergen Reaction Noted   Insulin  glargine  09/06/2022   Insulin  glargine-lixisenatide Other (See Comments) and Nausea And Vomiting 03/23/2019   Metformin  and related Nausea And Vomiting and Other (See Comments) 03/23/2019   Nsaids Other (See Comments) 04/25/2019    Review of Systems: A complete ROS was negative except as per HPI.   OBJECTIVE:   Physical Exam: Blood pressure (!) 175/77, temperature (!) 97.5 F (36.4 C), temperature source Axillary, resp. rate 15, height 5' 5 (1.651 m), weight 63.5 kg, SpO2 96%.  Constitutional: pt was alert with eyes closed in bed, in no acute distress HENT: Eyes were matted shut with dried purulent discharge Eyes: unable to open due to the purulent discharge Neck: supple Cardiovascular: regular rate and rhythm, no m/r/g Pulmonary/Chest: normal work of breathing on room air, lungs clear to auscultation bilaterally Abdominal: soft, non-tender, non-distended GU: Balanitis with fungal discharge under foreskin  which was covered with cream MSK: normal bulk and tone in BL UE--was able to squeeze with both hands, mild pain on palpation in first digit of left toe Neurological: alert & oriented x 1--to self only, knows he is in [place] but does not know location and time Skin: dry skin on both feet. Psych: Normal affect  Labs: CBC    Component Value Date/Time   WBC 5.9 02/23/2024 1130   RBC 2.84 (L) 02/23/2024 1130   HGB 9.9 (L) 02/23/2024 1130   HCT 31.4 (L) 02/23/2024 1130   PLT 166 02/23/2024 1130   MCV 110.6 (H) 02/23/2024 1130   MCH 34.9 (H) 02/23/2024 1130   MCHC 31.5 02/23/2024 1130   RDW 13.4 02/23/2024 1130   LYMPHSABS 1.3 02/23/2024 1130   MONOABS 0.3 02/23/2024 1130   EOSABS 0.1 02/23/2024 1130   BASOSABS 0.0 02/23/2024 1130     CMP     Component Value Date/Time   NA 138 02/23/2024 1130   K 5.2 (H) 02/23/2024 1130   CL 108 02/23/2024 1130   CO2 25 02/23/2024 1130   GLUCOSE 194 (H) 02/23/2024 1130   BUN 14 02/23/2024 1130   CREATININE 0.97 02/23/2024 1130   CALCIUM  8.3 (L) 02/23/2024 1130   PROT 5.7 (L) 02/23/2024 1130   ALBUMIN 2.8 (L) 02/23/2024 1130   AST 26 02/23/2024 1130   ALT 13 02/23/2024 1130   ALKPHOS 89 02/23/2024 1130   BILITOT 1.1 02/23/2024 1130   GFRNONAA >60 02/23/2024 1130   GFRAA 52 (L) 03/23/2020 0409    Imaging:  CT Head Wo Contrast Result Date: 02/23/2024 CLINICAL DATA:  Altered mental status. EXAM: CT HEAD WITHOUT CONTRAST TECHNIQUE: Contiguous axial images were obtained from the base of the skull through the vertex without intravenous contrast. RADIATION DOSE REDUCTION: This exam was performed according to the departmental dose-optimization program which includes automated exposure control, adjustment of the mA and/or kV according to patient size and/or use of iterative reconstruction technique. COMPARISON:  August 06, 2023 FINDINGS: Brain: There is a generalized cerebral atrophy with widening of the extra-axial spaces and stable ventricular  dilatation. There are areas of decreased attenuation within the white matter tracts of the supratentorial brain, consistent with microvascular disease changes. Small, chronic bilateral para thalamic lacunar infarcts are seen. Small, chronic bilateral cerebellar infarcts  are also noted. Vascular: Moderate severity bilateral cavus carotid artery calcification is noted. Skull: Normal. Negative for fracture or focal lesion. Sinuses/Orbits: No acute finding. Other: None. IMPRESSION: 1. Generalized cerebral atrophy with stable ventricular dilatation. 2. Chronic bilateral para thalamic and bilateral cerebellar infarcts. 3. No acute intracranial abnormality. Electronically Signed   By: Suzen Dials M.D.   On: 02/23/2024 12:22   DG Chest Port 1 View Result Date: 02/23/2024 CLINICAL DATA:  Weakness. EXAM: PORTABLE CHEST 1 VIEW COMPARISON:  August 06, 2023 FINDINGS: The heart size and mediastinal contours are within normal limits. Low lung volumes are noted. No acute infiltrate, pleural effusion or pneumothorax is identified. The visualized skeletal structures are unremarkable. IMPRESSION: No active disease. Electronically Signed   By: Suzen Dials M.D.   On: 02/23/2024 11:31     EKG: personally reviewed my interpretation is regular rate in sinus rhythm. Prolonged PR interval. These were also present in previous EKG.  ASSESSMENT & PLAN:   Assessment & Plan by Problem: Active Problems:   * No active hospital problems. *   Makenzie Vittorio is a 88 y.o. person living with a history of HLD, hypothyroidism, dementia, CVA, hx of polio with RLE weakness, BPH who presented with Syncope and admitted for UTI on hospital day 0  UTI Patient was brought in by the EMS as he passed out.  He did not endorse any urinary symptoms however his UA was positive for nitrates and leukocytes.  Wife mentioned that patient has had a history of multiple UTIs in the past year and that had a similar episode of them being diagnosed  with UTI last February.  Pt had negative troponins however EKG showed a prolonged PR interval which could have led to his symptoms.  Patient did come in to the ED with an elevated blood pressure of 186/85 which went down to 134/77.  Patient was afebrile with no signs of respiratory distress and negative chest x-ray for PNA.  Lactic acid levels were also normal.  Symptoms could also be due to electrolyte abnormalities as patient had mildly high potassium levels.  However less likely as his EKG did not show any changes in K+ level--could be due to dehydration.  Good strength in BL UE, so suspicion low for stroke. Even though the patient did not have any dysuria or other urinary symptoms, we believe patient's current syncopal episode was due to his UTI based on a similar episode in February and a history of UTIs this past year. Pt felt better in the ED after receiving IV fluids and a dose of rocephin . Will continue IV rocephin  and make changes to antibiotics once urine cultures are back.  Will monitor how the patient does antibiotics and after he eats and drinks.  -Continue IV Rocephin  -Encourage good PO intake -Pending blood cultures -Pending urine culture  Balanitis Ongoing fungal discharge present under foreskin.  Patient denies any symptoms of itching around the area. - Continue clotrimazole  1% cream  Dementia Patient will oriented to self only.  However he was conversing with us  and following all commands properly.  Per wife, patient currently back to his baseline mentation.  - Continue memantine  10 mg  Bilateral Conjunctivitis Patient had bilateral conjunctivitis with purulent discharge.  He received ciprofloxacin  ophthalmic solution drops.   - Continue ciprofloxacin  ophthalmic solution  Benign Prostatic Hyperplasia -Continue flomax   Hypothyroidism -Continue synthroid   HLD -continue atorvastatin    Hx CVA -conitnue Asprin    Best practice: Diet: Normal VTE: Enoxaparin  IVF:  NS,Bolus  Code: Full  Disposition planning: Prior to Admission Living Arrangement: home Anticipated Discharge Location: Home  Dispo: Admit patient to Observation with expected length of stay less than 2 midnights.  Signed: Edgardo Pontiff, DO Internal Medicine Resident  02/23/2024, 4:14 PM  On Call pager: [phone]

## 2024-02-23 NOTE — ED Notes (Addendum)
 Only 1 blood culture able to be collected, provider notified.

## 2024-02-23 NOTE — Hospital Course (Signed)
 This is a 88 year old gentleman with a past medical history of dementia, prior CVA, history of polio, hypothyroidism who presented with concerns for a syncopal episode.  Patient found incidentally to have A. Calcoaceticus baumannii bacteremia and admitted for IV antibiotics.  Syncope A. Calcoaceticus baumannii Bacteremia Pt was brought in by EMS after wife found him unresponsive in his wheelchair. Wife mentioned about pt having a similar episode previously and being diagnosed with UTI then along with several episodes of UTI thereafter. His syncopal episode was believed to be due to dehydration versus UTI.  Patient was initially treated with IV Rocephin  for UA being positive for nitrites and leukocytes.  Urine culture came back  negative. IV Rocephin  was changed to Unasyn  as patient's blood cultures came back positive for A. Calcoaceticus baumannii bacteremia.  ID was consulted for further management of bacteremia.  Patient was started on minocycline  200 mg twice daily.  Per ID recommendations, patient was to complete antibiotics for 7 days.  Patient completed last dose of IV Unasyn  and PO minocycline  on 03/02/2024 (Day 7).  On discharge evaluation patient was stable and denied any acute complaints. Pt was previously a hospice pt at [facility]. He will be discharged today and reenroll as [facility] hospice pt.   Balanitis  On admission, pt had ongoing fungal discharge present under foreskin, likely due to poor hygiene. Wife was unaware of this. Pt did have an episode of minor bleeding due to a small tear in the penis which resolved thereafter. He reported no acute complaints or symptoms of itching around the pelvic area. Topical clotrimazole  1% cream was applied in the area.  Onychomycosis Pt came in with severe onychomycosis of toe nails with macerations and exposed nailbeds. There was bleeding under the nailfolds of his left toes. We were concerned for cellulitis and osteomyelitis in his toes given they were  red and swollen. However, MRI of both feet were negative for any infections. Treated pt with topical antifungal cream in the area.   Bilateral Conjunctivitis On admission, pt's eyes were matted shut with purulent discharge. Conjunctivitis was likely due to entropion and lid apraxia (due to dementia). Treated with antibacterial drops. No mucopurulent discharge noted on discharge. Pt denied any concerns.   Dementia  On admission, pt was oriented to self only. However, his mentation improved and he was A&Ox3. Pt was good at conversing and following all our commands during PE. Continued treating him with home Memantine .  Benign Prostatic Hyperplasia Continued flomax  0.4 mg    Hypothyroidism Continued synthroid    HLD Continued atorvastatin  20mg    Hx CVA Continued Asprin 81 mg

## 2024-02-24 ENCOUNTER — Observation Stay (HOSPITAL_COMMUNITY)

## 2024-02-24 ENCOUNTER — Encounter (HOSPITAL_COMMUNITY): Payer: Self-pay | Admitting: Internal Medicine

## 2024-02-24 DIAGNOSIS — Z7982 Long term (current) use of aspirin: Secondary | ICD-10-CM

## 2024-02-24 DIAGNOSIS — Z79890 Hormone replacement therapy (postmenopausal): Secondary | ICD-10-CM

## 2024-02-24 DIAGNOSIS — E785 Hyperlipidemia, unspecified: Secondary | ICD-10-CM | POA: Diagnosis present

## 2024-02-24 DIAGNOSIS — B3742 Candidal balanitis: Secondary | ICD-10-CM | POA: Diagnosis present

## 2024-02-24 DIAGNOSIS — Z961 Presence of intraocular lens: Secondary | ICD-10-CM | POA: Diagnosis present

## 2024-02-24 DIAGNOSIS — B353 Tinea pedis: Secondary | ICD-10-CM | POA: Diagnosis not present

## 2024-02-24 DIAGNOSIS — H02003 Unspecified entropion of right eye, unspecified eyelid: Secondary | ICD-10-CM | POA: Diagnosis present

## 2024-02-24 DIAGNOSIS — E119 Type 2 diabetes mellitus without complications: Secondary | ICD-10-CM | POA: Diagnosis present

## 2024-02-24 DIAGNOSIS — M7989 Other specified soft tissue disorders: Secondary | ICD-10-CM | POA: Diagnosis not present

## 2024-02-24 DIAGNOSIS — E039 Hypothyroidism, unspecified: Secondary | ICD-10-CM | POA: Diagnosis present

## 2024-02-24 DIAGNOSIS — Z8612 Personal history of poliomyelitis: Secondary | ICD-10-CM | POA: Diagnosis not present

## 2024-02-24 DIAGNOSIS — H409 Unspecified glaucoma: Secondary | ICD-10-CM | POA: Diagnosis present

## 2024-02-24 DIAGNOSIS — Z87440 Personal history of urinary (tract) infections: Secondary | ICD-10-CM | POA: Diagnosis present

## 2024-02-24 DIAGNOSIS — Z79899 Other long term (current) drug therapy: Secondary | ICD-10-CM

## 2024-02-24 DIAGNOSIS — B351 Tinea unguium: Secondary | ICD-10-CM | POA: Diagnosis present

## 2024-02-24 DIAGNOSIS — R482 Apraxia: Secondary | ICD-10-CM

## 2024-02-24 DIAGNOSIS — I10 Essential (primary) hypertension: Secondary | ICD-10-CM | POA: Diagnosis present

## 2024-02-24 DIAGNOSIS — Z886 Allergy status to analgesic agent status: Secondary | ICD-10-CM | POA: Diagnosis not present

## 2024-02-24 DIAGNOSIS — N400 Enlarged prostate without lower urinary tract symptoms: Secondary | ICD-10-CM | POA: Diagnosis present

## 2024-02-24 DIAGNOSIS — B359 Dermatophytosis, unspecified: Secondary | ICD-10-CM | POA: Diagnosis not present

## 2024-02-24 DIAGNOSIS — D539 Nutritional anemia, unspecified: Secondary | ICD-10-CM | POA: Diagnosis present

## 2024-02-24 DIAGNOSIS — Z8673 Personal history of transient ischemic attack (TIA), and cerebral infarction without residual deficits: Secondary | ICD-10-CM | POA: Diagnosis not present

## 2024-02-24 DIAGNOSIS — R55 Syncope and collapse: Secondary | ICD-10-CM | POA: Diagnosis not present

## 2024-02-24 DIAGNOSIS — Z833 Family history of diabetes mellitus: Secondary | ICD-10-CM | POA: Diagnosis not present

## 2024-02-24 DIAGNOSIS — Z8249 Family history of ischemic heart disease and other diseases of the circulatory system: Secondary | ICD-10-CM | POA: Diagnosis not present

## 2024-02-24 DIAGNOSIS — H109 Unspecified conjunctivitis: Secondary | ICD-10-CM

## 2024-02-24 DIAGNOSIS — N481 Balanitis: Secondary | ICD-10-CM | POA: Diagnosis present

## 2024-02-24 DIAGNOSIS — Z66 Do not resuscitate: Secondary | ICD-10-CM | POA: Diagnosis present

## 2024-02-24 DIAGNOSIS — L89891 Pressure ulcer of other site, stage 1: Secondary | ICD-10-CM | POA: Diagnosis present

## 2024-02-24 DIAGNOSIS — F0390 Unspecified dementia without behavioral disturbance: Secondary | ICD-10-CM | POA: Diagnosis present

## 2024-02-24 DIAGNOSIS — Z888 Allergy status to other drugs, medicaments and biological substances status: Secondary | ICD-10-CM | POA: Diagnosis not present

## 2024-02-24 DIAGNOSIS — N3000 Acute cystitis without hematuria: Secondary | ICD-10-CM | POA: Diagnosis present

## 2024-02-24 DIAGNOSIS — Z792 Long term (current) use of antibiotics: Secondary | ICD-10-CM | POA: Diagnosis not present

## 2024-02-24 DIAGNOSIS — Z789 Other specified health status: Secondary | ICD-10-CM | POA: Diagnosis present

## 2024-02-24 DIAGNOSIS — A4154 Sepsis due to Acinetobacter baumannii: Secondary | ICD-10-CM | POA: Diagnosis present

## 2024-02-24 DIAGNOSIS — M85871 Other specified disorders of bone density and structure, right ankle and foot: Secondary | ICD-10-CM | POA: Diagnosis not present

## 2024-02-24 DIAGNOSIS — Z993 Dependence on wheelchair: Secondary | ICD-10-CM

## 2024-02-24 DIAGNOSIS — B9683 Acinetobacter baumannii as the cause of diseases classified elsewhere: Secondary | ICD-10-CM | POA: Diagnosis not present

## 2024-02-24 LAB — BASIC METABOLIC PANEL WITH GFR
Anion gap: 8 (ref 5–15)
BUN: 12 mg/dL (ref 8–23)
CO2: 24 mmol/L (ref 22–32)
Calcium: 9.1 mg/dL (ref 8.9–10.3)
Chloride: 106 mmol/L (ref 98–111)
Creatinine, Ser: 0.83 mg/dL (ref 0.61–1.24)
GFR, Estimated: >60 mL/min (ref >60)
Glucose, Bld: 155 mg/dL — ABNORMAL HIGH (ref 70–99)
Potassium: 4.3 mmol/L (ref 3.5–5.1)
Sodium: 138 mmol/L (ref 135–145)

## 2024-02-24 LAB — BLOOD CULTURE ID PANEL (REFLEXED) - BCID2

## 2024-02-24 LAB — CBC
HCT: 35.9 % — ABNORMAL LOW (ref 39.0–52.0)
Hemoglobin: 11.6 g/dL — ABNORMAL LOW (ref 13.0–17.0)
MCH: 34.4 pg — ABNORMAL HIGH (ref 26.0–34.0)
MCHC: 32.3 g/dL (ref 30.0–36.0)
MCV: 106.5 fL — ABNORMAL HIGH (ref 80.0–100.0)
Platelets: 178 K/uL (ref 150–400)
RBC: 3.37 MIL/uL — ABNORMAL LOW (ref 4.22–5.81)
RDW: 13.2 % (ref 11.5–15.5)
WBC: 9.7 K/uL (ref 4.0–10.5)
nRBC: 0 % (ref 0.0–0.2)

## 2024-02-24 LAB — URINE CULTURE: Culture: NO GROWTH

## 2024-02-24 LAB — VITAMIN B12: Vitamin B-12: 713 pg/mL (ref 180–914)

## 2024-02-24 LAB — FOLATE: Folate: 18.7 ng/mL (ref >5.9)

## 2024-02-24 MED ORDER — SODIUM CHLORIDE 0.9 % IV SOLN
1.0000 g | Freq: Once | INTRAVENOUS | Status: AC
  Administered 2024-02-24: 1 g via INTRAVENOUS
  Filled 2024-02-24: qty 10

## 2024-02-24 MED ORDER — LEVOTHYROXINE SODIUM 50 MCG PO TABS
50.0000 ug | ORAL_TABLET | Freq: Every day | ORAL | Status: DC
  Administered 2024-02-24 – 2024-03-02 (×8): 50 ug via ORAL
  Filled 2024-02-24 (×8): qty 1

## 2024-02-24 MED ORDER — LATANOPROST 0.005 % OP SOLN
1.0000 [drp] | Freq: Every day | OPHTHALMIC | Status: DC
  Administered 2024-02-24 – 2024-03-01 (×7): 1 [drp] via OPHTHALMIC
  Filled 2024-02-24: qty 2.5

## 2024-02-24 MED ORDER — DORZOLAMIDE HCL-TIMOLOL MAL 2-0.5 % OP SOLN
1.0000 [drp] | Freq: Two times a day (BID) | OPHTHALMIC | Status: DC
  Administered 2024-02-24 – 2024-03-02 (×15): 1 [drp] via OPHTHALMIC
  Filled 2024-02-24: qty 10

## 2024-02-24 MED ORDER — TERBINAFINE HCL 1 % EX CREA
TOPICAL_CREAM | Freq: Every day | CUTANEOUS | Status: DC
  Administered 2024-02-24: 1 via TOPICAL
  Filled 2024-02-24: qty 12

## 2024-02-24 MED ORDER — SODIUM CHLORIDE 0.9 % IV SOLN
3.0000 g | Freq: Four times a day (QID) | INTRAVENOUS | Status: AC
  Administered 2024-02-24 – 2024-03-02 (×28): 3 g via INTRAVENOUS
  Filled 2024-02-24 (×28): qty 8

## 2024-02-24 MED ORDER — BRIMONIDINE TARTRATE 0.2 % OP SOLN
1.0000 [drp] | Freq: Two times a day (BID) | OPHTHALMIC | Status: DC
  Administered 2024-02-24 – 2024-03-02 (×15): 1 [drp] via OPHTHALMIC
  Filled 2024-02-24 (×2): qty 5

## 2024-02-24 NOTE — Progress Notes (Signed)
 HD#0 SUBJECTIVE:  Patient Summary: Mike Mack is a 88 y.o. person living with a history of HLD, hypothyroidism, dementia, CVA, hx of polio with RLE weakness, BPH who presented with a syncopal episode and admitted for syncope  Overnight Events: none  Interim History: Saw pt at bedside this AM. He was conversing and following commands on PE. Was able to tell he was in [facility]. Was not able to give the year. Wife was beside him. Wife said pt mentioned of a HA which was similar to the ones he has had in the past. [place] she usually gives him tylenol  for them. Pt had no other acute complaints.   OBJECTIVE:  Vital Signs: Vitals:   02/23/24 2040 02/23/24 2326 02/24/24 0639 02/24/24 0736  BP: (!) 160/74 (!) 168/80 (!) 177/88 (!) 189/93  Pulse: 80 82 90 89  Resp:    16  Temp: 98.1 F (36.7 C) 98.3 F (36.8 C) 98.2 F (36.8 C) 98.1 F (36.7 C)  TempSrc: Oral Oral Oral Oral  SpO2: 97% 100% 97% 98%  Weight:      Height:       Supplemental O2: Room Air SpO2: 98 %  Filed Weights   02/23/24 1420  Weight: 63.5 kg     Intake/Output Summary (Last 24 hours) at 02/24/2024 1212 Last data filed at 02/24/2024 1100 Gross per 24 hour  Intake 601.28 ml  Output 900 ml  Net -298.72 ml   Net IO Since Admission: -298.72 mL [02/24/24 1212]  Physical Exam: Physical Exam HENT:     Head: Normocephalic.  Eyes:     Comments: Eyes: Pt able to open eyes today, however, they still appeared to be closed. Per wife, that's normally how much he opens. Mild purulent discharge from BL eyes. Hyperemic conjunctiva bilaterally.     Cardiovascular:     Rate and Rhythm: Normal rate and regular rhythm.     Pulses: Normal pulses.     Heart sounds: Normal heart sounds.  Genitourinary:    Comments: Penis: covered with dressing which had bloody appearance.  Skin:    Comments: Right Foot: Severe onychomycosis in toenails. Macerations around nailbeds. Skin on right lateral ankle had increased softness compared to  surrounding areas and non-blanchable erythema Left Foot: Severe onychomycosis in toenails with bleeding. Macerations around nailbeds. Swelling and redness noted in all 5 digits.   Neurological:     Mental Status: He is alert.     Patient Lines/Drains/Airways Status     Active Line/Drains/Airways     Name Placement date Placement time Site Days   Peripheral IV 02/23/24 18 G Anterior;Distal;Left;Upper Arm 02/23/24  1259  Arm  1   Wound Heel Left --  --  Heel  --   Wound 02/23/24 1816 Heel Posterior;Right 02/23/24  1816  Heel  1            Pertinent labs and imaging:     Latest Ref Rng & Units 02/24/2024    3:21 AM 02/23/2024   11:30 AM 08/09/2023    4:22 AM  CBC  WBC 4.0 - 10.5 K/uL 9.7  5.9  4.8   Hemoglobin 13.0 - 17.0 g/dL 88.3  9.9  88.9   Hematocrit 39.0 - 52.0 % 35.9  31.4  32.9   Platelets 150 - 400 K/uL 178  166  176        Latest Ref Rng & Units 02/24/2024    3:21 AM 02/23/2024   11:30 AM 08/09/2023    4:22  AM  CMP  Glucose 70 - 99 mg/dL 844  805  853   BUN 8 - 23 mg/dL 12  14  14    Creatinine 0.61 - 1.24 mg/dL 9.16  9.02  8.88   Sodium 135 - 145 mmol/L 138  138  137   Potassium 3.5 - 5.1 mmol/L 4.3  5.2  4.0   Chloride 98 - 111 mmol/L 106  108  105   CO2 22 - 32 mmol/L 24  25  23    Calcium  8.9 - 10.3 mg/dL 9.1  8.3  9.1   Total Protein 6.5 - 8.1 g/dL  5.7    Total Bilirubin 0.0 - 1.2 mg/dL  1.1    Alkaline Phos 38 - 126 U/L  89    AST 15 - 41 U/L  26    ALT 0 - 44 U/L  13      No results found.  ASSESSMENT/PLAN:  Assessment: Principal Problem:   Syncope  Mike Mack is a 88 y.o. person living with a history of HLD, hypothyroidism, dementia, CVA, hx of polio with RLE weakness, BPH who presented with a syncopal episode and admitted for syncope  Plan:  Syncope Pt's syncope was thought to be due to hypotension 2/2 to dehydration vs infection. UTI could have exacerbated his condition leading to syncope. His blood cultures came back positive for growing  Acinetobacter--will change IV rocephin  to Unasyn . Encourage good PO intake.   -Start unasyn   -stop IV Rocephin  -good PO intake -Pending urine culture   Balanitis Ongoing fungal discharge present under foreskin on admission.  Patient denies any symptoms of itching around the area. On PE, dressing appeared to have blood likely due to the infection.  - Continue clotrimazole  1% cream   Dementia Patient was oriented to self and place today. He was conversing with us  and following all commands properly.   -PT/OT evaluation - Continue memantine  10 mg   Onychomycosis Severe onychomycosis of the feet with macerations around nailbeds. Left foot also had bleeding around the nailbeds. Will start him on topical antifungal cream. Left foot digits swollen and red. MRI of left foot done to evaluate for osteomyelitis or underlying soft-tissue infection. Right foot digits did not appear as swollen or red--will get right foot x-ray for any underlying skin infection.   -MRI of left foot, pending results -x-ray of right foot -Topical clotrimazole  1% cream   Bilateral Conjunctivitis Less mucopurulent discharge in eyes compared to admission. Pt attempted to open eyes by himself. Eyes still looked closed to me but wife said that's how much he usually opens. Attempted to open eyes on PE and saw red conjunctiva.  - Continue ciprofloxacin  ophthalmic solution   Benign Prostatic Hyperplasia -Continue flomax  .4 mg    Hypothyroidism -Continue synthroid    HLD -continue atorvastatin  20mg    Hx CVA -conitnue Asprin 81 mg  Best Practice: Diet: Normal VTE: Enoxaparin  IVF: none Code: Full   Disposition planning: Prior to Admission Living Arrangement: home Anticipated Discharge Location: Home   Dispo: Admit patient to Observation with expected length of stay less than 2 midnights.  Signature:  Rebecka Edgardo Jolynn Davene Internal Medicine Residency  12:12 PM, 02/24/2024  On Call pager [phone]

## 2024-02-24 NOTE — Evaluation (Signed)
 Physical Therapy Evaluation Patient Details Name: Mike Mack MRN: [id number] DOB: 1936-01-18 Today's Date: 02/24/2024  History of Present Illness  The pt is an 88 yo male presenting 9/6 with weakness and syncopal event while eating breakfast. PMH includes: recurrent UTI since Feb 2025, HTN, SBO, polio (as a child, RLE weak), anemia, DM II, CVA (residual L hand numbness), arthritis, HLD, and dementia.   Clinical Impression  Pt in bed upon arrival of PT, agreeable to evaluation at this time. Prior to admission the pt was largely bedbound and dependent on assist from grandson for any transfers from bed-WC. Once in [facility], the pt is propelled by family. Per wife, pt does not consistently get out of bed and all ADLs are managed from bed level with total assist from wife or aide. The pt required maxA for all bed mobility and totalA to attempt sit-stand. Pt unable to assist functionally with mobility and is at his baseline according to wife who was present for session. No acute PT needs identified as pt is at his baseline mobility and has DME and assist (wife, aide, and grandson) available at home. The pt is at high risk for pressure sores, recommended increased pillows (none in room) and possibly prevalon boots for heels. The pt's wife reports she will bring heel protection from home and that the pt has an air mattress on his hospital bed at home to help with pressure relief, may benefit from air mattress acutely as well. Will sign off at this time as pt with no acute PT needs. Thank you for the consult.   VITALS:  - sitting EOB - BP: 170/87 (110); HR: 99bpm *pt reports dizziness* - supine (bed flat) - BP: 203/92 (118); HR: 95bpm - supine HOB at 25 deg - BP: 175/91 (116); HR: 94bpm     If plan is discharge home, recommend the following: Two people to help with walking and/or transfers;Two people to help with bathing/dressing/bathroom;Assistance with cooking/housework;Assistance with feeding;Direct  supervision/assist for medications management;Direct supervision/assist for financial management;Assist for transportation;Help with stairs or ramp for entrance;Supervision due to cognitive status   Can travel by private vehicle        Equipment Recommendations Other (comment) (recommend lift but per pt's wife, they have had a lift in past and she was unable to manage mechanical lift on her own. Could also benefit from increased aide services)  Recommendations for Other Services       Functional Status Assessment Patient has not had a recent decline in their functional status     Precautions / Restrictions Precautions Precautions: Fall Recall of Precautions/Restrictions: Impaired Precaution/Restrictions Comments: watch BP Restrictions Weight Bearing Restrictions Per Provider Order: No      Mobility  Bed Mobility Overal bed mobility: Needs Assistance Bed Mobility: Sidelying to Sit, Sit to Supine   Sidelying to sit: Max assist, +2 for physical assistance   Sit to supine: Max assist, +2 for physical assistance   General bed mobility comments: pt attempting to assist marginally with LE, pulling with UE but with poor power and strength to complete without assist. posterior and R lateral lean once at EOB    Transfers Overall transfer level: Needs assistance Equipment used: 2 person hand held assist Transfers: Sit to/from Stand Sit to Stand: Total assist, Max assist, +2 physical assistance           General transfer comment: totalA of 2 to attempt stand, no ability to assist with RLE, not following commands well to initiate or assist with  LLE      Balance Overall balance assessment: Needs assistance Sitting-balance support: Feet supported Sitting balance-Leahy Scale: Poor Sitting balance - Comments: R lateral and anterior lean   Standing balance support: During functional activity Standing balance-Leahy Scale: Zero Standing balance comment: dependent on totalA                              Pertinent Vitals/Pain Pain Assessment Pain Assessment: Faces Faces Pain Scale: Hurts little more Pain Location: posterior headache Pain Descriptors / Indicators: Headache Pain Intervention(s): Monitored during session, Limited activity within patient's tolerance, Repositioned    Home Living Family/patient expects to be discharged to:: Private residence Living Arrangements: Spouse/significant other Available Help at Discharge: Family;Personal care attendant (aide 3 days/week for bathing) Type of Home: House Home Access: Stairs to enter       Home Layout: One level Home Equipment: Wheelchair - manual;Hospital bed Additional Comments: pt largely bedbound per wife, they have had a lift in the past but she is unable to manage mechanical lift on her own, has aide 3x/day    Prior Function Prior Level of Function : Needs assist             Mobility Comments: pt's grandson pivots pt to w/c; unsure of frequency pt OOB to w/c but per wife not very often. pt has air mattress on hospital bed at home, not active with therapies ADLs Comments: total A for all ADLs at bed level     Extremity/Trunk Assessment   Upper Extremity Assessment Upper Extremity Assessment: Defer to OT evaluation    Lower Extremity Assessment Lower Extremity Assessment: Generalized weakness;RLE deficits/detail;LLE deficits/detail RLE Deficits / Details: hx of polio, grossly 2/5 to MMT with limited knee extension, suspect chronic LLE Deficits / Details: bandage on foot, grossly 4-/5 to MMT in supine, poot functional command following and initiation to use when mobilizing    Cervical / Trunk Assessment Cervical / Trunk Assessment: Other exceptions Cervical / Trunk Exceptions: preference for rotation to R, frail  Communication   Communication Communication: Impaired Factors Affecting Communication: Reduced clarity of speech    Cognition Arousal: Alert Behavior During  Therapy: WFL for tasks assessed/performed, Flat affect   PT - Cognitive impairments: Orientation, History of cognitive impairments, Awareness, Initiation, Problem solving, Safety/Judgement   Orientation impairments: Time, Situation                   PT - Cognition Comments: pt able to follow cues, poor memory of history (how long he has fed himself), and poor awareness. needing max cues, assist, and hand-over-hand assist to complete movements. at times question if due to low vision or cog Following commands: Impaired Following commands impaired: Follows one step commands with increased time     Cueing Cueing Techniques: Verbal cues     General Comments General comments (skin integrity, edema, etc.): Hypertensive with pt reporting headache in supine and dizziness in sitting. BP 170/87 (110) in sitting and 203/92 (118) in supine after sitting EOB    Exercises     Assessment/Plan    PT Assessment Patient does not need any further PT services  PT Problem List         PT Treatment Interventions      PT Goals (Current goals can be found in the Care Plan section)  Acute Rehab PT Goals Patient Stated Goal: none stated PT Goal Formulation: All assessment and education complete, DC therapy Time For  Goal Achievement: 03/02/24 Potential to Achieve Goals: Fair    Frequency       Co-evaluation   Reason for Co-Treatment: Complexity of the patient's impairments (multi-system involvement);Necessary to address cognition/behavior during functional activity;To address functional/ADL transfers;For patient/therapist safety PT goals addressed during session: Mobility/safety with mobility;Balance;Strengthening/ROM OT goals addressed during session: ADL's and self-care;Strengthening/ROM       AM-PAC PT 6 Clicks Mobility  Outcome Measure Help needed turning from your back to your side while in a flat bed without using bedrails?: Total Help needed moving from lying on your back to  sitting on the side of a flat bed without using bedrails?: Total Help needed moving to and from a bed to a chair (including a wheelchair)?: Total Help needed standing up from a chair using your arms (e.g., wheelchair or bedside chair)?: Total Help needed to walk in hospital room?: Total Help needed climbing 3-5 steps with a railing? : Total 6 Click Score: 6    End of Session Equipment Utilized During Treatment: Gait belt Activity Tolerance: Patient tolerated treatment well Patient left: in bed;with call bell/phone within reach;with bed alarm set;with family/visitor present;with nursing/sitter in room Nurse Communication: Mobility status;Need for lift equipment;Other (comment) (BP) PT Visit Diagnosis: Muscle weakness (generalized) (M62.81)    Time: 9056-8983 PT Time Calculation (min) (ACUTE ONLY): 33 min   Charges:   PT Evaluation $PT Eval Moderate Complexity: 1 Mod   PT General Charges $$ ACUTE PT VISIT: 1 Visit         Izetta Call, PT, DPT   Acute Rehabilitation Department Office [phone] Secure Chat Communication Preferred  Izetta JULIANNA Call 02/24/2024, 11:22 AM

## 2024-02-24 NOTE — Progress Notes (Signed)
 Ok to resume synthroid , xalatan  gtts, alphagan  gtts and cosopt  per team.  Sergio Batch, PharmD, BCIDP, AAHIVP, CPP Infectious Disease Pharmacist 02/24/2024 11:13 AM

## 2024-02-24 NOTE — Plan of Care (Signed)
  Problem: Clinical Measurements: Goal: Will remain free from infection Outcome: Progressing   Problem: Pain Managment: Goal: General experience of comfort will improve and/or be controlled Outcome: Progressing   Problem: Safety: Goal: Ability to remain free from injury will improve Outcome: Progressing   Problem: Skin Integrity: Goal: Risk for impaired skin integrity will decrease Outcome: Progressing

## 2024-02-24 NOTE — Evaluation (Signed)
 Occupational Therapy Evaluation Patient Details Name: Mike Mack MRN: [id number] DOB: 06/07/1936 Today's Date: 02/24/2024   History of Present Illness   Pt is an 88 y/o M presenting ot ED on 9/6 for syncopal episode while in w/c. PMH include HLD, hypothyroidism, dementia, CVA, polio with RLE weakness, BPH     Clinical Impressions Pt dependent at baseline for ADLs and mobility, pivots to w/c with assist of grandson but does not get OOB regularly. Pt has assist from spouse and aide for ADLs at bed level. Pt currently needing max +2 for bed mobility and max-total A +2 for standing attempts from EOB. Pt presenting with impairments listed below, however is close to baseline level of function. Pt has no further acute OT needs at this time, will s/o. Please reconsult if there is a change in pt status.     If plan is discharge home, recommend the following:   Two people to help with walking and/or transfers;Two people to help with bathing/dressing/bathroom;Assistance with cooking/housework;Assistance with feeding;Direct supervision/assist for medications management;Direct supervision/assist for financial management;Assist for transportation;Help with stairs or ramp for entrance;Supervision due to cognitive status     Functional Status Assessment   Patient has had a recent decline in their functional status and demonstrates the ability to make significant improvements in function in a reasonable and predictable amount of time.     Equipment Recommendations   None recommended by OT     Recommendations for Other Services   PT consult     Precautions/Restrictions   Precautions Precautions: Fall Restrictions Weight Bearing Restrictions Per Provider Order: No     Mobility Bed Mobility Overal bed mobility: Needs Assistance Bed Mobility: Sidelying to Sit, Sit to Supine   Sidelying to sit: Max assist, +2 for physical assistance   Sit to supine: Max assist, +2 for physical  assistance        Transfers Overall transfer level: Needs assistance Equipment used: 2 person hand held assist Transfers: Sit to/from Stand Sit to Stand: Total assist, Max assist, +2 physical assistance                  Balance Overall balance assessment: Needs assistance Sitting-balance support: Feet supported Sitting balance-Leahy Scale: Poor Sitting balance - Comments: R lateral and anterior lean   Standing balance support: During functional activity Standing balance-Leahy Scale: Zero                             ADL either performed or assessed with clinical judgement   ADL Overall ADL's : At baseline                                       General ADL Comments: dependent at baseline     Vision Baseline Vision/History: 1 Wears glasses Additional Comments: minimally opens eyes, unable to identify color, or see how many numbers are held up in front of him/ unable to reach for therapist's hand     Perception         Praxis         Pertinent Vitals/Pain Pain Assessment Pain Assessment: No/denies pain     Extremity/Trunk Assessment Upper Extremity Assessment Upper Extremity Assessment: Generalized weakness (impaired coordination/grasp)   Lower Extremity Assessment Lower Extremity Assessment: Defer to PT evaluation       Communication Communication Communication: Impaired Factors Affecting Communication: Reduced clarity  of speech   Cognition Arousal: Alert Behavior During Therapy: WFL for tasks assessed/performed Cognition: History of cognitive impairments             OT - Cognition Comments: hx dementia                 Following commands: Intact       Cueing  General Comments   Cueing Techniques: Verbal cues  hypertensive, see PT note for BP measures   Exercises     Shoulder Instructions      Home Living Family/patient expects to be discharged to:: Private residence Living Arrangements:  Spouse/significant other Available Help at Discharge: Family;Personal care attendant (aide 3 days/week for bathing) Type of Home: House Home Access: Stairs to enter     Home Layout: One level     Bathroom Shower/Tub: Producer, television/film/video: Handicapped height Bathroom Accessibility: Yes   Home Equipment: Wheelchair - manual;Hospital bed          Prior Functioning/Environment Prior Level of Function : Needs assist             Mobility Comments: pt's grandson pivots pt to w/c; unsure of frequency pt OOB to w/c ADLs Comments: total A for all ADLs at bed level    OT Problem List: Decreased strength;Decreased range of motion;Decreased activity tolerance;Impaired balance (sitting and/or standing)   OT Treatment/Interventions:        OT Goals(Current goals can be found in the care plan section)   Acute Rehab OT Goals Patient Stated Goal: did not state OT Goal Formulation: With patient Time For Goal Achievement: 03/09/24 Potential to Achieve Goals: Fair   OT Frequency:       Co-evaluation PT/OT/SLP Co-Evaluation/Treatment: Yes Reason for Co-Treatment: Complexity of the patient's impairments (multi-system involvement);Necessary to address cognition/behavior during functional activity;To address functional/ADL transfers;For patient/therapist safety   OT goals addressed during session: ADL's and self-care;Strengthening/ROM      AM-PAC OT 6 Clicks Daily Activity     Outcome Measure Help from another person eating meals?: Total Help from another person taking care of personal grooming?: Total Help from another person toileting, which includes using toliet, bedpan, or urinal?: Total Help from another person bathing (including washing, rinsing, drying)?: Total Help from another person to put on and taking off regular upper body clothing?: Total Help from another person to put on and taking off regular lower body clothing?: Total 6 Click Score: 6   End of  Session Equipment Utilized During Treatment: Gait belt Nurse Communication: Mobility status;Other (comment) (BP)  Activity Tolerance: Patient tolerated treatment well Patient left: in bed;with call bell/phone within reach;with bed alarm set;with family/visitor present;with nursing/sitter in room  OT Visit Diagnosis: Unsteadiness on feet (R26.81);Other abnormalities of gait and mobility (R26.89);Muscle weakness (generalized) (M62.81)                Time: 9056-8979 OT Time Calculation (min): 37 min Charges:  OT General Charges $OT Visit: 1 Visit OT Evaluation $OT Eval Moderate Complexity: 1 Mod  Idelia Caudell K, OTD, OTR/L SecureChat Preferred Acute Rehab [phone]   Heba Ige K Koonce 02/24/2024, 10:44 AM

## 2024-02-24 NOTE — Progress Notes (Signed)
 Bleeding at the tip of penis noted.MD made aware.

## 2024-02-24 NOTE — Progress Notes (Signed)
 PHARMACY - PHYSICIAN COMMUNICATION CRITICAL VALUE ALERT - BLOOD CULTURE IDENTIFICATION (BCID)  Mike Mack is an 88 y.o. male who presented to [facility] on 02/23/2024 with a chief complaint of syncope.   Assessment:  1/2 aerobic bottles growing gram negative rods identified as Acinetobacter calcoaceticus-baumannii complex. Source unclear.   Name of physician (or Provider) Contacted: Dr. Saad Nooruddin  Current antibiotics: Ceftriaxone   Changes to prescribed antibiotics recommended: Unasyn  3g q6h.  Recommendations accepted by provider  Results for orders placed or performed during the hospital encounter of 02/23/24  Blood Culture ID Panel (Reflexed) (Collected: 02/23/2024  6:00 PM)  Result Value Ref Range   Enterococcus faecalis NOT DETECTED NOT DETECTED   Enterococcus Faecium NOT DETECTED NOT DETECTED   Listeria monocytogenes NOT DETECTED NOT DETECTED   Staphylococcus species NOT DETECTED NOT DETECTED   Staphylococcus aureus (BCID) NOT DETECTED NOT DETECTED   Staphylococcus epidermidis NOT DETECTED NOT DETECTED   Staphylococcus lugdunensis NOT DETECTED NOT DETECTED   Streptococcus species NOT DETECTED NOT DETECTED   Streptococcus agalactiae NOT DETECTED NOT DETECTED   Streptococcus pneumoniae NOT DETECTED NOT DETECTED   Streptococcus pyogenes NOT DETECTED NOT DETECTED   A.calcoaceticus-baumannii DETECTED (A) NOT DETECTED   Bacteroides fragilis NOT DETECTED NOT DETECTED   Enterobacterales NOT DETECTED NOT DETECTED   Enterobacter cloacae complex NOT DETECTED NOT DETECTED   Escherichia coli NOT DETECTED NOT DETECTED   Klebsiella aerogenes NOT DETECTED NOT DETECTED   Klebsiella oxytoca NOT DETECTED NOT DETECTED   Klebsiella pneumoniae NOT DETECTED NOT DETECTED   Proteus species NOT DETECTED NOT DETECTED   Salmonella species NOT DETECTED NOT DETECTED   Serratia marcescens NOT DETECTED NOT DETECTED   Haemophilus influenzae NOT DETECTED NOT DETECTED   Neisseria meningitidis NOT  DETECTED NOT DETECTED   Pseudomonas aeruginosa NOT DETECTED NOT DETECTED   Stenotrophomonas maltophilia NOT DETECTED NOT DETECTED   Candida albicans NOT DETECTED NOT DETECTED   Candida auris NOT DETECTED NOT DETECTED   Candida glabrata NOT DETECTED NOT DETECTED   Candida krusei NOT DETECTED NOT DETECTED   Candida parapsilosis NOT DETECTED NOT DETECTED   Candida tropicalis NOT DETECTED NOT DETECTED   Cryptococcus neoformans/gattii NOT DETECTED NOT DETECTED   CTX-M ESBL NOT DETECTED NOT DETECTED   Carbapenem resistance IMP NOT DETECTED NOT DETECTED   Carbapenem resistance KPC NOT DETECTED NOT DETECTED   Carbapenem resistance NDM NOT DETECTED NOT DETECTED   Carbapenem resistance VIM NOT DETECTED NOT DETECTED    Samyrah Bruster M Yasmene Salomone 02/24/2024  12:54 PM

## 2024-02-24 NOTE — Plan of Care (Signed)
  Problem: Education: Goal: Knowledge of General Education information will improve Description: Including pain rating scale, medication(s)/side effects and non-pharmacologic comfort measures Outcome: Progressing   Problem: Clinical Measurements: Goal: Diagnostic test results will improve Outcome: Progressing   Problem: Nutrition: Goal: Adequate nutrition will be maintained Outcome: Progressing   Problem: Pain Managment: Goal: General experience of comfort will improve and/or be controlled Outcome: Progressing   Problem: Safety: Goal: Ability to remain free from injury will improve Outcome: Progressing

## 2024-02-25 DIAGNOSIS — F0390 Unspecified dementia without behavioral disturbance: Secondary | ICD-10-CM | POA: Diagnosis not present

## 2024-02-25 DIAGNOSIS — B359 Dermatophytosis, unspecified: Secondary | ICD-10-CM

## 2024-02-25 DIAGNOSIS — R55 Syncope and collapse: Secondary | ICD-10-CM | POA: Diagnosis not present

## 2024-02-25 DIAGNOSIS — N400 Enlarged prostate without lower urinary tract symptoms: Secondary | ICD-10-CM

## 2024-02-25 DIAGNOSIS — Z79890 Hormone replacement therapy (postmenopausal): Secondary | ICD-10-CM

## 2024-02-25 DIAGNOSIS — B351 Tinea unguium: Secondary | ICD-10-CM | POA: Diagnosis not present

## 2024-02-25 DIAGNOSIS — Z8673 Personal history of transient ischemic attack (TIA), and cerebral infarction without residual deficits: Secondary | ICD-10-CM

## 2024-02-25 DIAGNOSIS — Z79899 Other long term (current) drug therapy: Secondary | ICD-10-CM

## 2024-02-25 DIAGNOSIS — E785 Hyperlipidemia, unspecified: Secondary | ICD-10-CM

## 2024-02-25 DIAGNOSIS — H109 Unspecified conjunctivitis: Secondary | ICD-10-CM

## 2024-02-25 DIAGNOSIS — N481 Balanitis: Secondary | ICD-10-CM

## 2024-02-25 DIAGNOSIS — B9683 Acinetobacter baumannii as the cause of diseases classified elsewhere: Secondary | ICD-10-CM | POA: Diagnosis not present

## 2024-02-25 DIAGNOSIS — E039 Hypothyroidism, unspecified: Secondary | ICD-10-CM

## 2024-02-25 DIAGNOSIS — Z7982 Long term (current) use of aspirin: Secondary | ICD-10-CM

## 2024-02-25 LAB — CBC WITH DIFFERENTIAL/PLATELET
Abs Immature Granulocytes: 0.04 K/uL (ref 0.00–0.07)
Basophils Absolute: 0 K/uL (ref 0.0–0.1)
Basophils Relative: 0 %
Eosinophils Absolute: 0 K/uL (ref 0.0–0.5)
Eosinophils Relative: 0 %
HCT: 30.5 % — ABNORMAL LOW (ref 39.0–52.0)
Hemoglobin: 10 g/dL — ABNORMAL LOW (ref 13.0–17.0)
Immature Granulocytes: 1 %
Lymphocytes Relative: 15 %
Lymphs Abs: 1 K/uL (ref 0.7–4.0)
MCH: 34.6 pg — ABNORMAL HIGH (ref 26.0–34.0)
MCHC: 32.8 g/dL (ref 30.0–36.0)
MCV: 105.5 fL — ABNORMAL HIGH (ref 80.0–100.0)
Monocytes Absolute: 0.6 K/uL (ref 0.1–1.0)
Monocytes Relative: 9 %
Neutro Abs: 5 K/uL (ref 1.7–7.7)
Neutrophils Relative %: 75 %
Platelets: 165 K/uL (ref 150–400)
RBC: 2.89 MIL/uL — ABNORMAL LOW (ref 4.22–5.81)
RDW: 13.6 % (ref 11.5–15.5)
WBC: 6.6 K/uL (ref 4.0–10.5)
nRBC: 0 % (ref 0.0–0.2)

## 2024-02-25 LAB — BASIC METABOLIC PANEL WITH GFR
Anion gap: 11 (ref 5–15)
BUN: 11 mg/dL (ref 8–23)
CO2: 23 mmol/L (ref 22–32)
Calcium: 8.5 mg/dL — ABNORMAL LOW (ref 8.9–10.3)
Chloride: 103 mmol/L (ref 98–111)
Creatinine, Ser: 0.83 mg/dL (ref 0.61–1.24)
GFR, Estimated: >60 mL/min (ref >60)
Glucose, Bld: 90 mg/dL (ref 70–99)
Potassium: 4 mmol/L (ref 3.5–5.1)
Sodium: 137 mmol/L (ref 135–145)

## 2024-02-25 MED ORDER — MINOCYCLINE HCL 50 MG PO CAPS
200.0000 mg | ORAL_CAPSULE | Freq: Two times a day (BID) | ORAL | Status: AC
  Administered 2024-02-25 – 2024-03-02 (×12): 200 mg via ORAL
  Filled 2024-02-25 (×12): qty 4

## 2024-02-25 NOTE — Plan of Care (Signed)
  Problem: Pain Managment: Goal: General experience of comfort will improve and/or be controlled Outcome: Progressing   Problem: Safety: Goal: Ability to remain free from injury will improve Outcome: Progressing   Problem: Skin Integrity: Goal: Risk for impaired skin integrity will decrease Outcome: Progressing

## 2024-02-25 NOTE — Consult Note (Signed)
 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  She was transition to Unasyn .  Patient was not febrile and no leukocytosis noted.  Urine cultures no growth.  Recommendations:  -Continue Unasyn  - Add minocycline  - Follow blood cultures for sensitivities.  It is unclear if patient is bacteremic as no leukocytosis, a patient afebrile.  Blood cultures only grew 1 out of 4 Acinetobacter.  Given that there is bleeding around the toenail sites with some inflammation today, this could possibly part of entry of bacteria.  Plan to treat for 7 days. - Standard precautions - Plan communicated to primary Microbiology:   Antibiotics: Unasyn  9/7-present  Cultures: Blood 9/6 1 out of 4 Acinetobacter baumannii Urine  Other   HPI: Mike Mack is a 88 y.o. male with past medical history of dementia, injury in the past reexacerbated polio associated right lower extremity weakness, BPH, recurrent UTI, hepatic abscess, history  of kidney stones, CVA, SBO, wheelchair-bound brought in by EMS after 5 found him fainted while sitting.  Admitted with syncope.  Patient afebrile, WBC 9.7 K.  Suspected UTI.  Urine cultures no growth.  ID engaged as blood cultures grew 1 out of 4 Acinetobacter baumannii   Review of Systems: Review of Systems  All other systems reviewed and are negative.   Past Medical History:  Diagnosis Date   Acute metabolic encephalopathy 08/09/2022   Anemia 05/13/2013   Arthritis    right leg (07/25/2013)   BPH (benign prostatic hyperplasia)    Cholecystitis, acute 05/24/2013   Constipation 05/13/2013   Essential hypertension    Exertional shortness of breath    sometimes (07/25/2013)   Hepatic abscess 08/15/2013   History of kidney stones    History of stomach ulcers    Hypertension    Hypothermia 04/01/2022   Neuropathy    Polio    Polio Childhood   Small bowel obstruction (HCC)    Stroke (HCC) 2014   residual:  left hand kind of numb (07/25/2013)   Type II diabetes mellitus (HCC)     Social History   Tobacco Use   Smoking status: Never   Smokeless tobacco: Never  Vaping Use   Vaping status: Never Used  Substance Use Topics   Alcohol use: No   Drug use: No    Family History  Problem Relation Age of Onset   Heart failure Mother    Hypertension Sister    Diabetes Sister  Diabetes Brother    Hypertension Brother    Scheduled Meds:  aspirin  EC  81 mg Oral Daily   atorvastatin   20 mg Oral QPM   brimonidine   1 drop Both Eyes BID   ciprofloxacin   2 drop Both Eyes Q2H while awake   clotrimazole    Topical BID   dorzolamide -timolol   1 drop Both Eyes BID   enoxaparin  (LOVENOX ) injection  40 mg Subcutaneous Q24H   latanoprost   1 drop Both Eyes QHS   levothyroxine   50 mcg Oral Q0600   memantine   10 mg Oral Daily   pantoprazole   40 mg Oral Daily   polyethylene glycol  17 g Oral Daily   tamsulosin   0.4 mg Oral QPC supper   terbinafine    Topical Daily   Continuous  Infusions:  ampicillin -sulbactam (UNASYN ) IV 3 g (02/25/24 0913)   PRN Meds:. Allergies  Allergen Reactions   Insulin  Glargine    Insulin  Glargine-Lixisenatide Other (See Comments) and Nausea And Vomiting    Stomach cramps   Metformin  And Related Nausea And Vomiting and Other (See Comments)    Gi intolerance   Nsaids Other (See Comments)    Renal insufficiency    OBJECTIVE: Blood pressure (!) 152/85, pulse 85, temperature 98 F (36.7 C), temperature source Oral, resp. rate 14, height 5' 5 (1.651 m), weight 63.5 kg, SpO2 98%.  Physical Exam Constitutional:      General: He is not in acute distress.    Appearance: He is normal weight. He is not toxic-appearing.  HENT:     Head: Normocephalic and atraumatic.     Right Ear: External ear normal.     Left Ear: External ear normal.     Nose: No congestion or rhinorrhea.     Mouth/Throat:     Mouth: Mucous membranes are moist.     Pharynx: Oropharynx is clear.  Eyes:     Extraocular Movements: Extraocular movements intact.     Conjunctiva/sclera: Conjunctivae normal.     Pupils: Pupils are equal, round, and reactive to light.  Cardiovascular:     Rate and Rhythm: Normal rate and regular rhythm.     Heart sounds: No murmur heard.    No friction rub. No gallop.  Pulmonary:     Effort: Pulmonary effort is normal.     Breath sounds: Normal breath sounds.  Abdominal:     General: Abdomen is flat. Bowel sounds are normal.     Palpations: Abdomen is soft.  Musculoskeletal:        General: No swelling.     Cervical back: Normal range of motion and neck supple.     Comments: Bilateral mild forefoot swelling  Skin:    General: Skin is warm and dry.  Neurological:     General: No focal deficit present.     Mental Status: He is oriented to person, place, and time.  Psychiatric:        Mood and Affect: Mood normal.     Lab Results Lab Results  Component Value Date   WBC 9.7 02/24/2024   HGB 11.6 (L) 02/24/2024   HCT 35.9  (L) 02/24/2024   MCV 106.5 (H) 02/24/2024   PLT 178 02/24/2024    Lab Results  Component Value Date   CREATININE 0.83 02/24/2024   BUN 12 02/24/2024   NA 138 02/24/2024   K 4.3 02/24/2024   CL 106 02/24/2024   CO2 24 02/24/2024    Lab Results  Component Value Date   ALT  13 02/23/2024   AST 26 02/23/2024   ALKPHOS 89 02/23/2024   BILITOT 1.1 02/23/2024       Mike Stank, MD [facility] 02/25/2024, 11:34 AM

## 2024-02-25 NOTE — Progress Notes (Signed)
 pt has tear om tip of penis which has been report to me as being there since admission. Tear is causing bloody urine. Saad Nooruddin,MD made aware.

## 2024-02-25 NOTE — TOC Initial Note (Signed)
 Transition of Care (TOC) - Initial/Assessment Note   Spoke to patient's wife Saamir Armstrong at bedside. Patient remained with blanket over his head.   Patient from home with wife. Grandson assists and patient has an Engineer, production. NCM asked how many aide hours he has, wife replied aide comes when she can. Wife will see if aide can come more.   Patient has wheelchair, hospital bed and air mattress at home. PT recommended lift , wife has had lift in past and does not want one again.   Patient will need ambulance transport home at discharge. Confirmed address with his wife.   PCP is with [facility] [phone]. Wife unsure of providers name. NCM called and left voicemail , await call back . ( Per website Rosaline Hoots NP)  Patient Details  Name: Mike Mack MRN: [id number] Date of Birth: 1936/01/13  [facility] of [facility]) CM/SW Contact:    Stephane Powell Jansky, RN Phone Number: 02/25/2024, 1:01 PM  Clinical Narrative:                   Expected Discharge Plan: Home/Self Care Barriers to Discharge: Continued Medical Work up   Patient Goals and CMS Choice Patient states their goals for this hospitalization and ongoing recovery are:: to return to home          Expected Discharge Plan and Services   Discharge Planning Services: CM Consult Post Acute Care Choice: NA Living arrangements for the past 2 months: Single Family Home                 DME Arranged: N/A DME Agency: NA       HH Arranged: NA HH Agency: NA        Prior Living Arrangements/Services Living arrangements for the past 2 months: Single Family Home Lives with:: Spouse Patient language and need for interpreter reviewed:: Yes Do you feel safe going back to the place where you live?: Yes      Need for Family Participation in Patient Care: Yes (Comment) Care giver support system in place?: Yes (comment)   Criminal Activity/Legal Involvement Pertinent to Current Situation/Hospitalization: No - Comment as  needed  Activities of Daily Living      Permission Sought/Granted   Permission granted to share information with : Yes, Verbal Permission Granted  Share Information with NAME: wife Dorthy Southwell  Permission granted to share info w AGENCY: [facility]        Emotional Assessment Appearance:: Appears stated age Attitude/Demeanor/Rapport: Unable to Assess     Alcohol / Substance Use: Not Applicable Psych Involvement: No (comment)  Admission diagnosis:  Balanitis [N48.1] Syncope [R55] Acute cystitis without hematuria [N30.00] Acute bacterial conjunctivitis of both eyes [H10.33] Syncope, unspecified syncope type [R55] Patient Active Problem List   Diagnosis Date Noted   Candidal balanitis 02/24/2024   Uncircumcised male 02/24/2024   Bacterial conjunctivitis of both eyes 02/24/2024   Entropion of both eyes 02/24/2024   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 02/24/2024   History of recurrent UTIs 02/24/2024   Dependent for wheelchair mobility 02/24/2024   Pressure injury of right foot, stage 1 02/24/2024   Tinea pedis of both feet 02/24/2024   Pain due to onychomycosis of toenails of both feet 02/24/2024   Chronic diastolic CHF (congestive heart failure) (HCC) 08/06/2023   Sinus bradycardia-resolved 08/06/2023   History of dementia 08/06/2023   Thrombocytopenia (HCC) 08/06/2023   Acute metabolic encephalopathy 10/10/2022   Acute cystitis 10/10/2022   Hyperkalemia  10/10/2022   Dehydration 10/10/2022   Acquired hypothyroidism 08/30/2022   Urinary tract infection due to ESBL Klebsiella 08/30/2022   AKI (acute kidney injury) (HCC) 08/29/2022   Hypotension 08/29/2022   Acute encephalopathy 08/21/2022   Glaucoma 08/08/2022   recent GI bleed in 02/2022 04/01/2022   Right tibial fracture 04/01/2022   Pressure injury of skin 03/09/2022   Severe sepsis (HCC) 02/28/2022   Pleural effusion, bilateral 10/21/2021   Malnutrition of moderate degree 02/23/2021   Acute lower  UTI 02/22/2021   CKD stage 3a, GFR 45-59 ml/min (HCC) 02/21/2021   Dementia (HCC) 11/27/2020   Polio osteopathy of lower leg, left (HCC) 11/27/2020   Subphrenic abscess (HCC) 03/19/2020   Posterior vitreous detachment of both eyes 12/11/2019   Syncope 10/25/2014   History of CVA (cerebrovascular accident) 10/25/2014   History of poliomyelitis w/ RLE paresis 10/25/2014   Insulin  dependent type 2 diabetes mellitus (HCC) 10/25/2014   Tachycardia 08/29/2013   Cough 08/29/2013   Monoparesis of leg (HCC) 08/20/2013   Chronic weakness of right lower extremity 08/20/2013   Right upper quadrant abdominal abscess (HCC) 08/15/2013   Subacute delirium 08/14/2013   Oral thrush 08/14/2013   Leukocytosis 08/14/2013   Debility 08/14/2013   S/P cholecystectomy 07/29/2013   Cholecystitis with cholelithiasis 07/25/2013   Arthritis 06/06/2013   Headache 06/06/2013   Generalized weakness 05/24/2013   RUQ abdominal pain 05/13/2013   Constipation 05/13/2013   Enterococcus UTI 05/13/2013   Anemia 05/13/2013   BPH (benign prostatic hyperplasia)    Pruritic disorder 02/02/2011   PCP:  No primary care provider on file. Pharmacy:   [id number] [address], [place] - [address] [place] [address] Phone: [phone] Fax: [phone]  [facility] [address] [place] [address] Phone: [phone] Fax: [phone]     Social Drivers of Health (SDOH) Social History: SDOH Screenings   Food Insecurity: No Food Insecurity (02/23/2024)  Housing: Low Risk  (02/23/2024)  Transportation Needs: No Transportation Needs (02/23/2024)  Utilities: Not At Risk (02/23/2024)  Social Connections: Unknown (02/23/2024)  Tobacco Use: Low Risk  (02/23/2024)   SDOH Interventions:     Readmission Risk Interventions    08/08/2023    4:02 PM 09/04/2022   10:29 AM 08/24/2022   10:47 AM  Readmission Risk Prevention Plan  Transportation Screening  Complete Complete Complete  PCP or Specialist Appt within 3-5 Days Complete  Complete  HRI or Home Care Consult Complete  Complete  Social Work Consult for Recovery Care Planning/Counseling Complete  Complete  Palliative Care Screening Not Applicable  Not Applicable  Medication Review Oceanographer) Referral to Pharmacy Complete Referral to Pharmacy  PCP or Specialist appointment within 3-5 days of discharge  Complete   HRI or Home Care Consult  Complete   SW Recovery Care/Counseling Consult  Complete   Palliative Care Screening  Not Applicable   Skilled Nursing Facility  Not Applicable

## 2024-02-25 NOTE — Progress Notes (Signed)
 HD#1 SUBJECTIVE:  Patient Summary: Mike Mack is a 88 y.o. person living with a history of HLD, hypothyroidism, dementia, CVA, hx of polio with RLE weakness, BPH who presented with a syncopal episode and admitted for syncope   Overnight Events: none   Interim History:  Saw pt at bedside this AM. He mentioned feeling feverish last night and this morning. Denied any CP and SOB. Denied any pain or itching in the pelvic area. Denied any pain in his feet.   OBJECTIVE:  Vital Signs: Vitals:   02/24/24 1451 02/24/24 2000 02/25/24 0439 02/25/24 0816  BP: (!) 189/90 (!) 182/90 (!) 150/82 (!) 152/85  Pulse: 81 91 91 85  Resp: 16 18 17 14   Temp: (!) 97.4 F (36.3 C) 97.8 F (36.6 C) 97.8 F (36.6 C) 98 F (36.7 C)  TempSrc: Oral Oral Oral Oral  SpO2: 100% 97% 100% 98%  Weight:      Height:       Supplemental O2: Room Air SpO2: 98 %  Filed Weights   02/23/24 1420  Weight: 63.5 kg     Intake/Output Summary (Last 24 hours) at 02/25/2024 1128 Last data filed at 02/25/2024 0447 Gross per 24 hour  Intake 300 ml  Output 1150 ml  Net -850 ml   Net IO Since Admission: -1,148.72 mL [02/25/24 1128]  Physical Exam: Physical Exam Cardiovascular:     Rate and Rhythm: Normal rate and regular rhythm.     Heart sounds: Normal heart sounds.  Pulmonary:     Effort: Pulmonary effort is normal.     Breath sounds: Normal breath sounds.  Genitourinary:    Comments: Penis covered with dressing with some blood. Penis was not swollen. No smegma noted. Skin:    Comments: Severe onychomycosis in nails of all toes. Dry skin around nailbeds with macerations.   Neurological:     Mental Status: He is alert.     Patient Lines/Drains/Airways Status     Active Line/Drains/Airways     Name Placement date Placement time Site Days   Peripheral IV 02/23/24 18 G Anterior;Distal;Left;Upper Arm 02/23/24  1259  Arm  2   Wound Heel Left --  --  Heel  --   Wound 02/23/24 1816 Heel Posterior;Right  02/23/24  1816  Heel  2            Pertinent labs and imaging:      Latest Ref Rng & Units 02/24/2024    3:21 AM 02/23/2024   11:30 AM 08/09/2023    4:22 AM  CBC  WBC 4.0 - 10.5 K/uL 9.7  5.9  4.8   Hemoglobin 13.0 - 17.0 g/dL 88.3  9.9  88.9   Hematocrit 39.0 - 52.0 % 35.9  31.4  32.9   Platelets 150 - 400 K/uL 178  166  176        Latest Ref Rng & Units 02/24/2024    3:21 AM 02/23/2024   11:30 AM 08/09/2023    4:22 AM  CMP  Glucose 70 - 99 mg/dL 844  805  853   BUN 8 - 23 mg/dL 12  14  14    Creatinine 0.61 - 1.24 mg/dL 9.16  9.02  8.88   Sodium 135 - 145 mmol/L 138  138  137   Potassium 3.5 - 5.1 mmol/L 4.3  5.2  4.0   Chloride 98 - 111 mmol/L 106  108  105   CO2 22 - 32 mmol/L 24  25  23  Calcium  8.9 - 10.3 mg/dL 9.1  8.3  9.1   Total Protein 6.5 - 8.1 g/dL  5.7    Total Bilirubin 0.0 - 1.2 mg/dL  1.1    Alkaline Phos 38 - 126 U/L  89    AST 15 - 41 U/L  26    ALT 0 - 44 U/L  13      No results found.  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 person living with a history of HLD, hypothyroidism, dementia, CVA, hx of polio with RLE weakness, BPH who presented with a syncopal episode and admitted for syncope   Plan:   Syncope Pt's syncope was thought to be due to hypotension 2/2 to dehydration vs infection. Blood cultures positive for Acinetobacter. Unasyn  started yesterday. Pt was feeling feverish. ID consulted for further management. Encourage good PO intake.   -ID consulted,  appreciate recommendations -IV Unasyn : Day 2  -good PO intake -Pending urine culture   Balanitis Pt denies any itching and pain around penis or pelvic area.  On PE, dressing over penis had blood. Penis did not look swollen and had no smegma. Will continue with keeping the area clean along with antifungal cream.  - Continue clotrimazole  1% cream   Dementia  Pt was conversing with us  and following all commands properly.   -PT/OT evaluation - Continue memantine  10 mg   Onychomycosis Tenia pedis and sever onychomycosis of toes. Started him on topical antifungal cream. MRI results of left foot still pending. Right foot x-ray showed dorsal soft tissue swelling likely 2/2 to edema or infection.    -MRI of left foot, pending results -abx as noted above  -Topical clotrimazole  1% cream   Bilateral Conjunctivitis Less mucopurulent discharge in eyes compared to admission.   - Continue ciprofloxacin  ophthalmic solution   Benign Prostatic Hyperplasia -Continue flomax  .4 mg    Hypothyroidism -Continue synthroid    HLD -continue atorvastatin  20mg    Hx CVA -conitnue Asprin 81 mg    Best Practice: Diet: Normal VTE: Enoxaparin  IVF: none Code: Full   Disposition planning: Prior to Admission Living Arrangement: home Anticipated Discharge Location: Home   Dispo: Admit patient to Observation with expected length of stay > than 2 midnights.  Signature:  Rebecka Edgardo [facility]  11:28 AM, 02/25/2024  On Call pager [phone]

## 2024-02-26 ENCOUNTER — Inpatient Hospital Stay (HOSPITAL_COMMUNITY)

## 2024-02-26 DIAGNOSIS — N481 Balanitis: Secondary | ICD-10-CM | POA: Diagnosis not present

## 2024-02-26 DIAGNOSIS — B351 Tinea unguium: Secondary | ICD-10-CM | POA: Diagnosis not present

## 2024-02-26 DIAGNOSIS — R7881 Bacteremia: Secondary | ICD-10-CM | POA: Diagnosis present

## 2024-02-26 DIAGNOSIS — F0390 Unspecified dementia without behavioral disturbance: Secondary | ICD-10-CM | POA: Diagnosis not present

## 2024-02-26 DIAGNOSIS — R55 Syncope and collapse: Secondary | ICD-10-CM | POA: Diagnosis not present

## 2024-02-26 DIAGNOSIS — B9683 Acinetobacter baumannii as the cause of diseases classified elsewhere: Secondary | ICD-10-CM | POA: Diagnosis not present

## 2024-02-26 DIAGNOSIS — B359 Dermatophytosis, unspecified: Secondary | ICD-10-CM | POA: Diagnosis not present

## 2024-02-26 LAB — BASIC METABOLIC PANEL WITH GFR
Anion gap: 12 (ref 5–15)
BUN: 13 mg/dL (ref 8–23)
CO2: 23 mmol/L (ref 22–32)
Calcium: 8.5 mg/dL — ABNORMAL LOW (ref 8.9–10.3)
Chloride: 104 mmol/L (ref 98–111)
Creatinine, Ser: 1 mg/dL (ref 0.61–1.24)
GFR, Estimated: >60 mL/min (ref >60)
Glucose, Bld: 131 mg/dL — ABNORMAL HIGH (ref 70–99)
Potassium: 4.1 mmol/L (ref 3.5–5.1)
Sodium: 139 mmol/L (ref 135–145)

## 2024-02-26 LAB — CBC WITH DIFFERENTIAL/PLATELET
Abs Immature Granulocytes: 0.03 K/uL (ref 0.00–0.07)
Basophils Absolute: 0 K/uL (ref 0.0–0.1)
Basophils Relative: 1 %
Eosinophils Absolute: 0 K/uL (ref 0.0–0.5)
Eosinophils Relative: 0 %
HCT: 29 % — ABNORMAL LOW (ref 39.0–52.0)
Hemoglobin: 9.3 g/dL — ABNORMAL LOW (ref 13.0–17.0)
Immature Granulocytes: 1 %
Lymphocytes Relative: 23 %
Lymphs Abs: 1.4 K/uL (ref 0.7–4.0)
MCH: 34.3 pg — ABNORMAL HIGH (ref 26.0–34.0)
MCHC: 32.1 g/dL (ref 30.0–36.0)
MCV: 107 fL — ABNORMAL HIGH (ref 80.0–100.0)
Monocytes Absolute: 0.6 K/uL (ref 0.1–1.0)
Monocytes Relative: 10 %
Neutro Abs: 4.1 K/uL (ref 1.7–7.7)
Neutrophils Relative %: 65 %
Platelets: 157 K/uL (ref 150–400)
RBC: 2.71 MIL/uL — ABNORMAL LOW (ref 4.22–5.81)
RDW: 14 % (ref 11.5–15.5)
WBC: 6.1 K/uL (ref 4.0–10.5)
nRBC: 0 % (ref 0.0–0.2)

## 2024-02-26 LAB — CULTURE, BLOOD (ROUTINE X 2)

## 2024-02-26 MED ORDER — GADOBUTROL 1 MMOL/ML IV SOLN
6.0000 mL | Freq: Once | INTRAVENOUS | Status: AC | PRN
  Administered 2024-02-26: 6 mL via INTRAVENOUS

## 2024-02-26 NOTE — Progress Notes (Signed)
 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  She was transition to Unasyn .  Patient was not febrile and no leukocytosis noted.  Urine cultures no growth.   It is unclear if patient was truly  bacteremic as no leukocytosis,  afebrile.  Blood cultures only grew 1/4 Acinetobacter.  Given that there is bleeding around the toenail sites , now worsening right foot swelling, will plan to treat with 7 days of antibiotics Recommendations:  -Continue Unasyn  and minocycline .  Plan to treat for 7 days. --With increasing swelling of left foot, will get MRI - Standard precautions - Plan communicated to primary   Microbiology:   Antibiotics: Unasyn  9/7-present Minocycline  9/8-present Cultures: Blood 9/6 1 out of 4 Acinetobacter baumannii   SUBJECTIVE: Resting in bed wife at bedside Interval: afebrile  Review of Systems: Review of Systems  All other systems reviewed and are negative.    Scheduled Meds:  aspirin  EC  81 mg Oral Daily   atorvastatin   20 mg Oral  QPM   brimonidine   1 drop Both Eyes BID   ciprofloxacin   2 drop Both Eyes Q2H while awake   clotrimazole    Topical BID   dorzolamide -timolol   1 drop Both Eyes BID   enoxaparin  (LOVENOX ) injection  40 mg Subcutaneous Q24H   latanoprost   1 drop Both Eyes QHS   levothyroxine   50 mcg Oral Q0600   memantine   10 mg Oral Daily   minocycline   200 mg Oral BID   pantoprazole   40 mg Oral Daily   polyethylene glycol  17 g Oral Daily   tamsulosin   0.4 mg Oral QPC supper   terbinafine    Topical Daily   Continuous Infusions:  ampicillin -sulbactam (UNASYN ) IV 3 g (02/26/24 1400)   PRN Meds:. Allergies  Allergen Reactions   Insulin  Glargine    Insulin  Glargine-Lixisenatide Other (See Comments) and Nausea And Vomiting    Stomach cramps   Metformin  And Related Nausea And Vomiting and Other (See Comments)    Gi intolerance   Nsaids Other (See Comments)    Renal insufficiency    OBJECTIVE: Vitals:   02/25/24 1600 02/25/24 2254 02/26/24 0337 02/26/24 0942  BP: 131/67 (!) 157/90 131/68 (!) 155/76  Pulse: 71 82  86  Resp: 16 17 17 17   Temp: 98 F (36.7 C) 98 F (36.7 C) 99 F (37.2 C) 98.2 F (36.8 C)  TempSrc: Oral Oral Oral Oral  SpO2: 100% 98% 96%   Weight:  Height:       Body mass index is 23.3 kg/m.  Physical Exam Constitutional:      General: He is not in acute distress.    Appearance: He is normal weight. He is not toxic-appearing.  HENT:     Head: Normocephalic and atraumatic.     Right Ear: External ear normal.     Left Ear: External ear normal.     Nose: No congestion or rhinorrhea.     Mouth/Throat:     Mouth: Mucous membranes are moist.     Pharynx: Oropharynx is clear.  Eyes:     Extraocular Movements: Extraocular movements intact.     Conjunctiva/sclera: Conjunctivae normal.     Pupils: Pupils are equal, round, and reactive to light.  Cardiovascular:     Rate and Rhythm: Normal rate and regular rhythm.     Heart sounds: No murmur heard.    No friction  rub. No gallop.  Pulmonary:     Effort: Pulmonary effort is normal.     Breath sounds: Normal breath sounds.  Abdominal:     General: Abdomen is flat. Bowel sounds are normal.     Palpations: Abdomen is soft.  Musculoskeletal:        General: Swelling present.     Cervical back: Normal range of motion and neck supple.  Skin:    General: Skin is warm and dry.  Neurological:     General: No focal deficit present.     Mental Status: He is oriented to person, place, and time.  Psychiatric:        Mood and Affect: Mood normal.     Left foot bandaged. Right foot wswelling   Lab Results Lab Results  Component Value Date   WBC 6.1 02/26/2024   HGB 9.3 (L) 02/26/2024   HCT 29.0 (L) 02/26/2024   MCV 107.0 (H) 02/26/2024   PLT 157 02/26/2024    Lab Results  Component Value Date   CREATININE 1.00 02/26/2024   BUN 13 02/26/2024   NA 139 02/26/2024   K 4.1 02/26/2024   CL 104 02/26/2024   CO2 23 02/26/2024    Lab Results  Component Value Date   ALT 13 02/23/2024   AST 26 02/23/2024   ALKPHOS 89 02/23/2024   BILITOT 1.1 02/23/2024        Mike Stank, MD [facility] 02/26/2024, 3:19 PM Evaluation of this patient requires complex antimicrobial therapy evaluation and counseling + isolation needs for disease transmission risk assessment and mitigation

## 2024-02-26 NOTE — Progress Notes (Signed)
 HD#2 SUBJECTIVE:  Patient Summary:  Mike Mack is a 88 y.o. person living with a history of HLD, hypothyroidism, dementia, CVA, hx of polio with RLE weakness, BPH who presented with a syncopal episode and admitted for syncope   Overnight Events: none  Interim History:  Saw pt at bedside this AM. Wide at bedside. Wife said pt complaint of some HA. Pt denies fevers, chills. Said he still has pain in his feet. Denies other acute complaints.   OBJECTIVE:  Vital Signs: Vitals:   02/25/24 1600 02/25/24 2254 02/26/24 0337 02/26/24 0942  BP: 131/67 (!) 157/90 131/68 (!) 155/76  Pulse: 71 82  86  Resp: 16 17 17 17   Temp: 98 F (36.7 C) 98 F (36.7 C) 99 F (37.2 C) 98.2 F (36.8 C)  TempSrc: Oral Oral Oral Oral  SpO2: 100% 98% 96%   Weight:      Height:       Supplemental O2: Room Air SpO2: 96 %  Filed Weights   02/23/24 1420  Weight: 63.5 kg     Intake/Output Summary (Last 24 hours) at 02/26/2024 1151 Last data filed at 02/26/2024 0900 Gross per 24 hour  Intake 640 ml  Output 550 ml  Net 90 ml   Net IO Since Admission: -1,058.72 mL [02/26/24 1151]  Physical Exam: Physical Exam HENT:     Head: Normocephalic.  Cardiovascular:     Rate and Rhythm: Normal rate and regular rhythm.     Heart sounds: Normal heart sounds.  Pulmonary:     Effort: Pulmonary effort is normal.  Genitourinary:    Comments: Penis: had a dressing over it. Bleeding noted. No swelling, or smegma noted.  Neurological:     Mental Status: He is alert.     Patient Lines/Drains/Airways Status     Active Line/Drains/Airways     Name Placement date Placement time Site Days   Peripheral IV 02/23/24 18 G Anterior;Distal;Left;Upper Arm 02/23/24  1259  Arm  3   Wound Heel Left --  --  Heel  --   Wound 02/23/24 1816 Heel Posterior;Right 02/23/24  1816  Heel  3            Pertinent labs and imaging:      Latest Ref Rng & Units 02/26/2024    3:11 AM 02/25/2024   10:09 AM 02/24/2024    3:21 AM   CBC  WBC 4.0 - 10.5 K/uL 6.1  6.6  9.7   Hemoglobin 13.0 - 17.0 g/dL 9.3  89.9  88.3   Hematocrit 39.0 - 52.0 % 29.0  30.5  35.9   Platelets 150 - 400 K/uL 157  165  178        Latest Ref Rng & Units 02/26/2024    3:11 AM 02/25/2024   10:09 AM 02/24/2024    3:21 AM  CMP  Glucose 70 - 99 mg/dL 868  90  844   BUN 8 - 23 mg/dL 13  11  12    Creatinine 0.61 - 1.24 mg/dL 8.99  9.16  9.16   Sodium 135 - 145 mmol/L 139  137  138   Potassium 3.5 - 5.1 mmol/L 4.1  4.0  4.3   Chloride 98 - 111 mmol/L 104  103  106   CO2 22 - 32 mmol/L 23  23  24    Calcium  8.9 - 10.3 mg/dL 8.5  8.5  9.1     No results found.  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 Blood cultures positive for Acinetobacter, unclear source with a possibility of infection being due to skin disruption from fungal infection. ID recommended adding minocycline  to abx regimen and continue for 7 days. Will wait on final reading of blood cx for further management.     -ID consulted, appreciate recommendations -IV Unasyn : Day 3 -Minocycline  200 mg BID PO: Day 2 -Pending final blood cx reading   Balanitis Pt denies any itching and pain around penis or pelvic area.  On PE, dressing over penis had blood. Penis did not look swollen and had no smegma. Will continue with keeping the area clean along with antifungal cream. Pt doesn't want to use urinal as he doesn't know when he urinates  sometimes. He has been receiving topical antifungal cream.  - Continue clotrimazole  1% cream   Dementia  Pt was conversing with us  and following all commands properly.   -PT/OT evaluated and said pt does not need any additional assistance at home from the hospital - Continue memantine  10 mg   Onychomycosis Tenia pedis and sever onychomycosis of toes. Started him on topical antifungal cream. MRI results of left foot was negative for osteomyelitis or septic arthritis.  -abx as noted above  -Topical clotrimazole  1% cream  Chronic Conditions   Bilateral Conjunctivitis Less mucopurulent discharge in eyes compared to admission.    - Continue ciprofloxacin  ophthalmic solution   Benign Prostatic Hyperplasia -Continue flomax  .4 mg    Hypothyroidism -Continue synthroid    HLD -continue atorvastatin  20mg    Hx CVA -conitnue Asprin 81 mg Best Practice: Diet: Normal VTE: Enoxaparin  IVF: none Code: Full   Disposition planning: Prior to Admission Living Arrangement: home Anticipated Discharge Location: Home   Dispo: Admit patient to Observation with expected length of stay > than 2 midnight  Signature:  Rebecka Edgardo [facility]  11:51 AM, 02/26/2024  On Call pager [phone]

## 2024-02-27 DIAGNOSIS — F0390 Unspecified dementia without behavioral disturbance: Secondary | ICD-10-CM | POA: Diagnosis not present

## 2024-02-27 DIAGNOSIS — R55 Syncope and collapse: Secondary | ICD-10-CM | POA: Diagnosis not present

## 2024-02-27 DIAGNOSIS — B351 Tinea unguium: Secondary | ICD-10-CM | POA: Diagnosis not present

## 2024-02-27 DIAGNOSIS — N481 Balanitis: Secondary | ICD-10-CM | POA: Diagnosis not present

## 2024-02-27 LAB — GLUCOSE, CAPILLARY: Glucose-Capillary: 95 mg/dL (ref 70–99)

## 2024-02-27 LAB — CBC WITH DIFFERENTIAL/PLATELET
Abs Immature Granulocytes: 0.02 K/uL (ref 0.00–0.07)
Basophils Absolute: 0 K/uL (ref 0.0–0.1)
Basophils Relative: 1 %
Eosinophils Absolute: 0.1 K/uL (ref 0.0–0.5)
Eosinophils Relative: 1 %
HCT: 26.7 % — ABNORMAL LOW (ref 39.0–52.0)
Hemoglobin: 8.7 g/dL — ABNORMAL LOW (ref 13.0–17.0)
Immature Granulocytes: 0 %
Lymphocytes Relative: 29 %
Lymphs Abs: 1.4 K/uL (ref 0.7–4.0)
MCH: 34.1 pg — ABNORMAL HIGH (ref 26.0–34.0)
MCHC: 32.6 g/dL (ref 30.0–36.0)
MCV: 104.7 fL — ABNORMAL HIGH (ref 80.0–100.0)
Monocytes Absolute: 0.5 K/uL (ref 0.1–1.0)
Monocytes Relative: 10 %
Neutro Abs: 2.9 K/uL (ref 1.7–7.7)
Neutrophils Relative %: 59 %
Platelets: 167 K/uL (ref 150–400)
RBC: 2.55 MIL/uL — ABNORMAL LOW (ref 4.22–5.81)
RDW: 13.6 % (ref 11.5–15.5)
WBC: 4.9 K/uL (ref 4.0–10.5)
nRBC: 0 % (ref 0.0–0.2)

## 2024-02-27 LAB — BASIC METABOLIC PANEL WITH GFR
Anion gap: 8 (ref 5–15)
BUN: 10 mg/dL (ref 8–23)
CO2: 26 mmol/L (ref 22–32)
Calcium: 8.1 mg/dL — ABNORMAL LOW (ref 8.9–10.3)
Chloride: 106 mmol/L (ref 98–111)
Creatinine, Ser: 1.01 mg/dL (ref 0.61–1.24)
GFR, Estimated: >60 mL/min (ref >60)
Glucose, Bld: 109 mg/dL — ABNORMAL HIGH (ref 70–99)
Potassium: 3.9 mmol/L (ref 3.5–5.1)
Sodium: 140 mmol/L (ref 135–145)

## 2024-02-27 NOTE — Care Management Important Message (Signed)
 Important Message  Patient Details  Name: Mike Mack MRN: [id number] Date of Birth: 17-Aug-1935   Important Message Given:  Yes - Medicare IM     Jon Cruel 02/27/2024, 4:01 PM

## 2024-02-27 NOTE — Progress Notes (Signed)
 HD#3 SUBJECTIVE:  Patient Summary: Mike Mack is a 88 y.o. person living with a history of HLD, hypothyroidism, dementia, CVA, hx of polio with RLE weakness, BPH who presented with a syncopal episode and admitted for syncope   Overnight Events: none  Interim History:  Saw pt at bedside this AM. Wife was at bedside. Pt denies fevers, chills, pain in feet. Wife mentioned that she did not notice any blood coming from his catheter. Pt denies any acute complaints. Wife said [facility] PCP comes to his house. Has a bedside commode but is unable to use it because he can't get on it. Was previously on hospice and had to revoke upon admission to the hospital.   OBJECTIVE:  Vital Signs: Vitals:   02/26/24 1530 02/26/24 2304 02/27/24 0441 02/27/24 0827  BP: (!) 175/77 (!) 174/87 (!) 166/76 (!) 197/90  Pulse: 81 78 71 77  Resp: 17 16 17 17   Temp: 98.3 F (36.8 C) 98.1 F (36.7 C) 98 F (36.7 C) 98.3 F (36.8 C)  TempSrc: Oral Oral Oral Oral  SpO2: 100% 100% 99% 98%  Weight:      Height:       Supplemental O2: Room Air SpO2: 98 %  Filed Weights   02/23/24 1420  Weight: 63.5 kg     Intake/Output Summary (Last 24 hours) at 02/27/2024 1037 Last data filed at 02/27/2024 0900 Gross per 24 hour  Intake 410 ml  Output 700 ml  Net -290 ml   Net IO Since Admission: -1,348.72 mL [02/27/24 1037]  Physical Exam: Physical Exam HENT:     Head: Normocephalic.  Eyes:     Comments: No mucopurulent discharge noted today   Cardiovascular:     Rate and Rhythm: Normal rate and regular rhythm.     Heart sounds: Normal heart sounds.  Pulmonary:     Effort: Pulmonary effort is normal.     Breath sounds: Normal breath sounds.  Skin:    General: Skin is warm.  Neurological:     Mental Status: He is alert.     Patient Lines/Drains/Airways Status     Active Line/Drains/Airways     Name Placement date Placement time Site Days   Peripheral IV 02/23/24 18 G Anterior;Distal;Left;Upper  Arm 02/23/24  1259  Arm  4   Wound Heel Left --  --  Heel  --   Wound 02/23/24 1816 Heel Posterior;Right 02/23/24  1816  Heel  4            Pertinent labs and imaging:      Latest Ref Rng & Units 02/27/2024    2:55 AM 02/26/2024    3:11 AM 02/25/2024   10:09 AM  CBC  WBC 4.0 - 10.5 K/uL 4.9  6.1  6.6   Hemoglobin 13.0 - 17.0 g/dL 8.7  9.3  89.9   Hematocrit 39.0 - 52.0 % 26.7  29.0  30.5   Platelets 150 - 400 K/uL 167  157  165        Latest Ref Rng & Units 02/27/2024    2:55 AM 02/26/2024    3:11 AM 02/25/2024   10:09 AM  CMP  Glucose 70 - 99 mg/dL 890  868  90   BUN 8 - 23 mg/dL 10  13  11    Creatinine 0.61 - 1.24 mg/dL 8.98  8.99  9.16   Sodium 135 - 145 mmol/L 140  139  137   Potassium 3.5 - 5.1 mmol/L 3.9  4.1  4.0   Chloride 98 - 111 mmol/L 106  104  103   CO2 22 - 32 mmol/L 26  23  23    Calcium  8.9 - 10.3 mg/dL 8.1  8.5  8.5     MR FOOT RIGHT W WO CONTRAST Result Date: 02/27/2024 CLINICAL DATA:  Right foot swelling.  Concern for infection. EXAM: MRI OF THE RIGHT FOREFOOT WITHOUT AND WITH CONTRAST TECHNIQUE: Multiplanar, multisequence MR imaging of the right foot was performed before and after the administration of intravenous contrast. CONTRAST:  6mL GADAVIST  GADOBUTROL  1 MMOL/ML IV SOLN COMPARISON:  Right foot radiographs dated 02/24/2024. FINDINGS: Bones/Joint/Cartilage No acute fracture or dislocation. No joint effusion. No marrow signal abnormality. There is fusion of the calcaneus and cuboid. Ligaments Lisfranc ligament is intact.  Collateral ligaments appear intact. Muscles and Tendons Atrophy and increased T2 signal of the intrinsic foot musculature, likely reflects chronic denervation changes. No tenosynovitis. Soft tissue Subcutaneous edema extending along the dorsal foot without significant corresponding enhancement. No loculated fluid collection. Focus of susceptibility artifact along the dorsal midfoot, at the level of the lateral cuneiform is nonspecific and may be  postsurgical. IMPRESSION: 1. Nonspecific subcutaneous edema of the dorsal foot. No loculated fluid collection. 2. No acute osseous abnormality. 3. Chronic denervation changes of the foot musculature. Electronically Signed   By: Harrietta Sherry M.D.   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 Final blood cx reading was positive for Acinetobacter with susceptibilities to zosyn , Unasyn , and meropenem . MRI of R foot was negative for any infections. ID recommended continuing treatment with IV abx for 7 days.    -Total days of inpatient abx: 3 -IV Unasyn : Day 4 -Minocycline  200 mg BID PO: Day 3    Balanitis Will continue with antifungal cream. Bleeding has gone down, per wife.  - Continue clotrimazole  1% cream   Dementia  Pt was conversing with us  and following all commands properly.   -No additional assistance needed at home from the hospital - Continue memantine  10 mg   Onychomycosis Tenia pedis and sever onychomycosis of toes. On topical antifungal cream. MRI right foot negative for any infections.    -abx as noted above  -Topical clotrimazole  1% cream   Chronic Conditions   Bilateral Conjunctivitis No mucopurulent discharge noted today.   - Continue ciprofloxacin  ophthalmic solution   Benign Prostatic Hyperplasia -Continue flomax  .4 mg    Hypothyroidism -Continue synthroid     HLD -continue atorvastatin  20mg    Hx CVA -conitnue Asprin 81 mg Best Practice: Diet: Normal VTE: Enoxaparin  IVF: none Code: Full   Disposition planning: Prior to Admission Living Arrangement: home Anticipated Discharge Location: Home   Dispo: Admit patient to Observation with expected length of stay > than 2 midnight  Signature:  Rebecka Edgardo [facility]  10:37 AM, 02/27/2024  On  Call pager [phone]

## 2024-02-28 DIAGNOSIS — B353 Tinea pedis: Secondary | ICD-10-CM

## 2024-02-28 DIAGNOSIS — N481 Balanitis: Secondary | ICD-10-CM | POA: Diagnosis not present

## 2024-02-28 DIAGNOSIS — B9683 Acinetobacter baumannii as the cause of diseases classified elsewhere: Secondary | ICD-10-CM

## 2024-02-28 DIAGNOSIS — B351 Tinea unguium: Secondary | ICD-10-CM | POA: Diagnosis not present

## 2024-02-28 LAB — BASIC METABOLIC PANEL WITH GFR
Anion gap: 10 (ref 5–15)
BUN: 11 mg/dL (ref 8–23)
CO2: 24 mmol/L (ref 22–32)
Calcium: 8.4 mg/dL — ABNORMAL LOW (ref 8.9–10.3)
Chloride: 105 mmol/L (ref 98–111)
Creatinine, Ser: 1.05 mg/dL (ref 0.61–1.24)
GFR, Estimated: >60 mL/min (ref >60)
Glucose, Bld: 111 mg/dL — ABNORMAL HIGH (ref 70–99)
Potassium: 4.2 mmol/L (ref 3.5–5.1)
Sodium: 139 mmol/L (ref 135–145)

## 2024-02-28 LAB — CBC WITH DIFFERENTIAL/PLATELET
Abs Immature Granulocytes: 0.02 K/uL (ref 0.00–0.07)
Basophils Absolute: 0.1 K/uL (ref 0.0–0.1)
Basophils Relative: 1 %
Eosinophils Absolute: 0 K/uL (ref 0.0–0.5)
Eosinophils Relative: 1 %
HCT: 29.9 % — ABNORMAL LOW (ref 39.0–52.0)
Hemoglobin: 9.9 g/dL — ABNORMAL LOW (ref 13.0–17.0)
Immature Granulocytes: 0 %
Lymphocytes Relative: 35 %
Lymphs Abs: 2.1 K/uL (ref 0.7–4.0)
MCH: 34.5 pg — ABNORMAL HIGH (ref 26.0–34.0)
MCHC: 33.1 g/dL (ref 30.0–36.0)
MCV: 104.2 fL — ABNORMAL HIGH (ref 80.0–100.0)
Monocytes Absolute: 0.4 K/uL (ref 0.1–1.0)
Monocytes Relative: 7 %
Neutro Abs: 3.4 K/uL (ref 1.7–7.7)
Neutrophils Relative %: 56 %
Platelets: 179 K/uL (ref 150–400)
RBC: 2.87 MIL/uL — ABNORMAL LOW (ref 4.22–5.81)
RDW: 13.5 % (ref 11.5–15.5)
WBC: 6 K/uL (ref 4.0–10.5)
nRBC: 0 % (ref 0.0–0.2)

## 2024-02-28 LAB — CULTURE, BLOOD (ROUTINE X 2): Culture: NO GROWTH

## 2024-02-28 NOTE — Progress Notes (Signed)
 HD#4 SUBJECTIVE:  Patient Summary: Mike Mack is a 88 y.o. person living with a history of HLD, hypothyroidism, dementia, CVA, hx of polio with RLE weakness, BPH who presented with a syncopal episode and admitted for syncope   Overnight Events: none  Interim History:  Saw pt at bedside this AM. Denied any fevers, chills, other acute complaints. Wife was at bedside, said she wanted him to continue all IV abx before going home.   OBJECTIVE:  Vital Signs: Vitals:   02/27/24 2137 02/28/24 0605 02/28/24 0827 02/28/24 0856  BP: (!) 174/93 (!) 196/94 (!) 207/96 (!) 166/88  Pulse: 86 87 87   Resp: 14 14    Temp: 97.6 F (36.4 C) 97.6 F (36.4 C) 98.3 F (36.8 C)   TempSrc: Oral Oral Oral   SpO2: 100% 99% 99%   Weight:      Height:       Supplemental O2: Room Air SpO2: 99 %  Filed Weights   02/23/24 1420  Weight: 63.5 kg     Intake/Output Summary (Last 24 hours) at 02/28/2024 1330 Last data filed at 02/28/2024 0900 Gross per 24 hour  Intake 320 ml  Output 2450 ml  Net -2130 ml   Net IO Since Admission: -3,478.72 mL [02/28/24 1330]  Physical Exam: Physical Exam Cardiovascular:     Rate and Rhythm: Normal rate and regular rhythm.  Pulmonary:     Effort: Pulmonary effort is normal.     Breath sounds: Normal breath sounds.  Skin:    Comments: Right foot: severe onychomycosis in the toe nails. Swelling noted in the toes.   Neurological:     Mental Status: He is alert.     Patient Lines/Drains/Airways Status     Active Line/Drains/Airways     Name Placement date Placement time Site Days   Peripheral IV 02/23/24 18 G Anterior;Distal;Left;Upper Arm 02/23/24  1259  Arm  5   Wound Heel Left --  --  Heel  --   Wound 02/23/24 1816 Heel Posterior;Right 02/23/24  1816  Heel  5            Pertinent labs and imaging:      Latest Ref Rng & Units 02/28/2024    3:38 AM 02/27/2024    2:55 AM 02/26/2024    3:11 AM  CBC  WBC 4.0 - 10.5 K/uL 6.0  4.9  6.1    Hemoglobin 13.0 - 17.0 g/dL 9.9  8.7  9.3   Hematocrit 39.0 - 52.0 % 29.9  26.7  29.0   Platelets 150 - 400 K/uL 179  167  157        Latest Ref Rng & Units 02/28/2024    3:38 AM 02/27/2024    2:55 AM 02/26/2024    3:11 AM  CMP  Glucose 70 - 99 mg/dL 888  890  868   BUN 8 - 23 mg/dL 11  10  13    Creatinine 0.61 - 1.24 mg/dL 8.94  8.98  8.99   Sodium 135 - 145 mmol/L 139  140  139   Potassium 3.5 - 5.1 mmol/L 4.2  3.9  4.1   Chloride 98 - 111 mmol/L 105  106  104   CO2 22 - 32 mmol/L 24  26  23    Calcium  8.9 - 10.3 mg/dL 8.4  8.1  8.5     No results found.  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 Final blood cx reading was positive for Acinetobacter with susceptibilities to zosyn , Unasyn , and meropenem . Treating pt with IV abx for 7 days.    -Total days of inpatient abx: 4 -IV Unasyn : Day 5 -Minocycline  200 mg BID PO: Day 4     Balanitis Will continue with antifungal cream.  - Continue clotrimazole  1% cream   Dementia  Pt was conversing with us  and following all commands properly.   -No additional assistance needed at home from the hospital - Continue memantine  10 mg   Onychomycosis Tenia pedis and sever onychomycosis of toes. On topical antifungal cream.   -abx as noted above  -Topical clotrimazole  1% cream   Chronic Conditions   Bilateral Conjunctivitis No mucopurulent discharge noted today.   -  Continue ciprofloxacin  ophthalmic solution   Benign Prostatic Hyperplasia -Continue flomax  .4 mg    Hypothyroidism -Continue synthroid    HLD -continue atorvastatin  20mg    Hx CVA -conitnue Asprin 81 mg  Best Practice: Diet: Normal VTE: Enoxaparin  IVF: none Code: Full   Disposition planning: Prior to Admission Living Arrangement: home Anticipated Discharge Location: Home   Dispo: Admit patient to Observation with expected length of stay > than 2 midnight Signature:  Rebecka Edgardo [facility]  1:30 PM, 02/28/2024  On Call pager [phone]

## 2024-02-28 NOTE — Plan of Care (Signed)
  Problem: Pain Managment: Goal: General experience of comfort will improve and/or be controlled Outcome: Progressing   Problem: Safety: Goal: Ability to remain free from injury will improve Outcome: Progressing   Problem: Skin Integrity: Goal: Risk for impaired skin integrity will decrease Outcome: Progressing

## 2024-02-28 NOTE — Plan of Care (Signed)
  Problem: Pain Managment: Goal: General experience of comfort will improve and/or be controlled Outcome: Progressing   Problem: Safety: Goal: Ability to remain free from injury will improve Outcome: Progressing

## 2024-02-29 DIAGNOSIS — N481 Balanitis: Secondary | ICD-10-CM | POA: Diagnosis not present

## 2024-02-29 DIAGNOSIS — B9683 Acinetobacter baumannii as the cause of diseases classified elsewhere: Secondary | ICD-10-CM | POA: Diagnosis not present

## 2024-02-29 DIAGNOSIS — B353 Tinea pedis: Secondary | ICD-10-CM | POA: Diagnosis not present

## 2024-02-29 DIAGNOSIS — B351 Tinea unguium: Secondary | ICD-10-CM | POA: Diagnosis not present

## 2024-02-29 LAB — CBC WITH DIFFERENTIAL/PLATELET
Abs Immature Granulocytes: 0.01 K/uL (ref 0.00–0.07)
Basophils Absolute: 0.1 K/uL (ref 0.0–0.1)
Basophils Relative: 1 %
Eosinophils Absolute: 0 K/uL (ref 0.0–0.5)
Eosinophils Relative: 1 %
HCT: 34.3 % — ABNORMAL LOW (ref 39.0–52.0)
Hemoglobin: 10.9 g/dL — ABNORMAL LOW (ref 13.0–17.0)
Immature Granulocytes: 0 %
Lymphocytes Relative: 27 %
Lymphs Abs: 1.8 K/uL (ref 0.7–4.0)
MCH: 34 pg (ref 26.0–34.0)
MCHC: 31.8 g/dL (ref 30.0–36.0)
MCV: 106.9 fL — ABNORMAL HIGH (ref 80.0–100.0)
Monocytes Absolute: 0.5 K/uL (ref 0.1–1.0)
Monocytes Relative: 8 %
Neutro Abs: 4.3 K/uL (ref 1.7–7.7)
Neutrophils Relative %: 63 %
Platelets: 192 K/uL (ref 150–400)
RBC: 3.21 MIL/uL — ABNORMAL LOW (ref 4.22–5.81)
RDW: 13.4 % (ref 11.5–15.5)
WBC: 6.6 K/uL (ref 4.0–10.5)
nRBC: 0 % (ref 0.0–0.2)

## 2024-02-29 LAB — BASIC METABOLIC PANEL WITH GFR
Anion gap: 11 (ref 5–15)
BUN: 13 mg/dL (ref 8–23)
CO2: 25 mmol/L (ref 22–32)
Calcium: 8.6 mg/dL — ABNORMAL LOW (ref 8.9–10.3)
Chloride: 106 mmol/L (ref 98–111)
Creatinine, Ser: 1.12 mg/dL (ref 0.61–1.24)
GFR, Estimated: >60 mL/min (ref >60)
Glucose, Bld: 103 mg/dL — ABNORMAL HIGH (ref 70–99)
Potassium: 4.4 mmol/L (ref 3.5–5.1)
Sodium: 142 mmol/L (ref 135–145)

## 2024-02-29 MED ORDER — AMLODIPINE BESYLATE 5 MG PO TABS
5.0000 mg | ORAL_TABLET | Freq: Every day | ORAL | Status: DC
  Administered 2024-02-29 – 2024-03-02 (×3): 5 mg via ORAL
  Filled 2024-02-29 (×3): qty 1

## 2024-02-29 NOTE — Progress Notes (Addendum)
 HD#5 SUBJECTIVE:  Patient Summary:  Mike Mack is a 88 y.o. person living with a history of HLD, hypothyroidism, dementia, CVA, hx of polio with RLE weakness, BPH who presented with a syncopal episode and admitted for syncope   Overnight Events: none  Interim History:  Saw pt at bedside this AM. Denied any fevers, chills. No acute complaints.   OBJECTIVE:  Vital Signs: Vitals:   02/28/24 1900 02/29/24 0000 02/29/24 0637 02/29/24 0751  BP: (!) 181/90 (!) 166/90 (!) 172/79 (!) 172/83  Pulse: 82 78 71 71  Resp: 18 18 17 16   Temp: 98 F (36.7 C) 98 F (36.7 C) (!) 97.4 F (36.3 C) 98.3 F (36.8 C)  TempSrc: Oral Oral Oral Oral  SpO2: 99% 99% 98% 99%  Weight:      Height:       Supplemental O2: Room Air SpO2: 99 %  Filed Weights   02/23/24 1420  Weight: 63.5 kg     Intake/Output Summary (Last 24 hours) at 02/29/2024 0803 Last data filed at 02/29/2024 0700 Gross per 24 hour  Intake --  Output 1700 ml  Net -1700 ml   Net IO Since Admission: -4,478.72 mL [02/29/24 0803]  Physical Exam: Physical Exam Cardiovascular:     Rate and Rhythm: Normal rate and regular rhythm.  Pulmonary:     Effort: Pulmonary effort is normal.     Breath sounds: Normal breath sounds.  Skin:    Comments: Severe onychomycosis noted in toe nails of both feet. No bleeding noted in right or left toe nails.   Neurological:     Mental Status: He is alert.     Patient Lines/Drains/Airways Status     Active Line/Drains/Airways     Name Placement date Placement time Site Days   Peripheral IV 02/23/24 18 G Anterior;Distal;Left;Upper Arm 02/23/24  1259  Arm  6   Wound Heel Left --  --  Heel  --   Wound 02/23/24 1816 Heel Posterior;Right 02/23/24  1816  Heel  6            Pertinent labs and imaging:      Latest Ref Rng & Units 02/28/2024    3:38 AM 02/27/2024    2:55 AM 02/26/2024    3:11 AM  CBC  WBC 4.0 - 10.5 K/uL 6.0  4.9  6.1   Hemoglobin 13.0 - 17.0 g/dL 9.9  8.7  9.3    Hematocrit 39.0 - 52.0 % 29.9  26.7  29.0   Platelets 150 - 400 K/uL 179  167  157        Latest Ref Rng & Units 02/29/2024    3:24 AM 02/28/2024    3:38 AM 02/27/2024    2:55 AM  CMP  Glucose 70 - 99 mg/dL 896  888  890   BUN 8 - 23 mg/dL 13  11  10    Creatinine 0.61 - 1.24 mg/dL 8.87  8.94  8.98   Sodium 135 - 145 mmol/L 142  139  140   Potassium 3.5 - 5.1 mmol/L 4.4  4.2  3.9   Chloride 98 - 111 mmol/L 106  105  106   CO2 22 - 32 mmol/L 25  24  26    Calcium  8.9 - 10.3 mg/dL 8.6  8.4  8.1     No results found.  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 Final blood cx reading was positive for Acinetobacter with susceptibilities to zosyn , Unasyn , and meropenem . Treating pt with IV abx for 7 days.    -Total days of inpatient abx: 5 -IV Unasyn : Day 6 -Minocycline  200 mg BID PO: Day 5     Balanitis Will continue with antifungal cream.  - Continue clotrimazole  1% cream   Dementia  Pt was conversing with us  and following all commands properly.   -No additional assistance needed at home from the hospital - Continue memantine  10 mg   Onychomycosis Tenia pedis and sever onychomycosis of toes. On topical antifungal cream.   -abx as noted above  -Topical clotrimazole  1% cream   Chronic Conditions   Bilateral Conjunctivitis No mucopurulent discharge noted today.   - Continue ciprofloxacin  ophthalmic  solution   Benign Prostatic Hyperplasia -Continue flomax  .4 mg    Hypothyroidism -Continue synthroid    HLD -continue atorvastatin  20mg    Hx CVA -conitnue Asprin 81 mg  Best Practice: Diet: Normal VTE: Enoxaparin  IVF: none Code: Full   Disposition planning: Prior to Admission Living Arrangement: home Anticipated Discharge Location: Home   Dispo: Discharge home IV Abx end date 9/14  Signature:  Rebecka Edgardo [facility]  8:03 AM, 02/29/2024  On Call pager [phone]

## 2024-03-01 DIAGNOSIS — R55 Syncope and collapse: Secondary | ICD-10-CM

## 2024-03-01 DIAGNOSIS — N481 Balanitis: Secondary | ICD-10-CM

## 2024-03-01 DIAGNOSIS — D649 Anemia, unspecified: Secondary | ICD-10-CM

## 2024-03-01 DIAGNOSIS — A4154 Sepsis due to Acinetobacter baumannii: Principal | ICD-10-CM

## 2024-03-01 DIAGNOSIS — Z792 Long term (current) use of antibiotics: Secondary | ICD-10-CM

## 2024-03-01 DIAGNOSIS — F0390 Unspecified dementia without behavioral disturbance: Secondary | ICD-10-CM

## 2024-03-01 DIAGNOSIS — Z8673 Personal history of transient ischemic attack (TIA), and cerebral infarction without residual deficits: Secondary | ICD-10-CM

## 2024-03-01 LAB — CBC WITH DIFFERENTIAL/PLATELET
Abs Immature Granulocytes: 0.02 K/uL (ref 0.00–0.07)
Basophils Absolute: 0 K/uL (ref 0.0–0.1)
Basophils Relative: 1 %
Eosinophils Absolute: 0.1 K/uL (ref 0.0–0.5)
Eosinophils Relative: 1 %
HCT: 28.4 % — ABNORMAL LOW (ref 39.0–52.0)
Hemoglobin: 9.2 g/dL — ABNORMAL LOW (ref 13.0–17.0)
Immature Granulocytes: 0 %
Lymphocytes Relative: 28 %
Lymphs Abs: 2 K/uL (ref 0.7–4.0)
MCH: 34.2 pg — ABNORMAL HIGH (ref 26.0–34.0)
MCHC: 32.4 g/dL (ref 30.0–36.0)
MCV: 105.6 fL — ABNORMAL HIGH (ref 80.0–100.0)
Monocytes Absolute: 0.5 K/uL (ref 0.1–1.0)
Monocytes Relative: 7 %
Neutro Abs: 4.6 K/uL (ref 1.7–7.7)
Neutrophils Relative %: 63 %
Platelets: 187 K/uL (ref 150–400)
RBC: 2.69 MIL/uL — ABNORMAL LOW (ref 4.22–5.81)
RDW: 13.3 % (ref 11.5–15.5)
WBC: 7.2 K/uL (ref 4.0–10.5)
nRBC: 0.3 % — ABNORMAL HIGH (ref 0.0–0.2)

## 2024-03-01 NOTE — Plan of Care (Signed)
  Problem: Education: Goal: Knowledge of General Education information will improve Description: Including pain rating scale, medication(s)/side effects and non-pharmacologic comfort measures Outcome: Progressing   Problem: Health Behavior/Discharge Planning: Goal: Ability to manage health-related needs will improve Outcome: Progressing   Problem: Clinical Measurements: Goal: Ability to maintain clinical measurements within normal limits will improve Outcome: Progressing Goal: Will remain free from infection Outcome: Progressing Goal: Diagnostic test results will improve Outcome: Progressing   Problem: Nutrition: Goal: Adequate nutrition will be maintained Outcome: Progressing   

## 2024-03-01 NOTE — TOC Progression Note (Signed)
 Transition of Care [facility]) - Progression Note    Patient Details  Name: Mike Mack MRN: [id number] Date of Birth: 1936/01/28  Transition of [facility]) CM/SW Contact  Lesleyanne Politte, [place], [place] Phone Number: 03/01/2024, 1:39 PM  Clinical Narrative:    Phone call to patient's spouse who confirmed desire for patient to continue hospice care at home with [facility] following discharge form the hospital.  [facility], LCSW Transition of Care      Expected Discharge Plan: Home/Self Care Barriers to Discharge: Continued Medical Work up               Expected Discharge Plan and Services   Discharge Planning Services: CM Consult Post Acute Care Choice: NA Living arrangements for the past 2 months: Single Family Home                 DME Arranged: N/A DME Agency: NA       HH Arranged: NA HH Agency: NA         Social Drivers of Health (SDOH) Interventions SDOH Screenings   Food Insecurity: No Food Insecurity (02/23/2024)  Housing: Low Risk  (02/23/2024)  Transportation Needs: No Transportation Needs (02/23/2024)  Utilities: Not At Risk (02/23/2024)  Social Connections: Unknown (02/23/2024)  Tobacco Use: Low Risk  (02/23/2024)    Readmission Risk Interventions    08/08/2023    4:02 PM 09/04/2022   10:29 AM 08/24/2022   10:47 AM  Readmission Risk Prevention Plan  Transportation Screening Complete Complete Complete  PCP or Specialist Appt within 3-5 Days Complete  Complete  HRI or Home Care Consult Complete  Complete  Social Work Consult for Recovery Care Planning/Counseling Complete  Complete  Palliative Care Screening Not Applicable  Not Applicable  Medication Review [employer]) Referral to Pharmacy Complete Referral to Pharmacy  PCP or Specialist appointment within 3-5 days of discharge  Complete   HRI or Home Care Consult  Complete   SW Recovery Care/Counseling Consult  Complete   Palliative Care Screening  Not Applicable   Skilled Nursing Facility  Not  Applicable

## 2024-03-01 NOTE — Progress Notes (Addendum)
 HD#6 Subjective:   Summary: This is a 88 year old gentleman with a past medical history of dementia, prior CVA, history of polio, hypothyroidism who presents with concerns for a syncopal episode.  Patient found incidentally to have A. Calcoaceticus baumannii bacteremia and admitted for IV antibiotics.  Overnight Events: No acute events overnight.  Patient evaluated bedside this morning.  He states he is doing well.  He has no concerns.  Wife at bedside.  She states he is doing well.  He is eating breakfast.  Objective:  Vital signs in last 24 hours: Vitals:   02/29/24 1558 02/29/24 2014 03/01/24 0400 03/01/24 0815  BP: (!) 160/76 (!) 143/67 (!) 159/88 (!) 168/80  Pulse: 68 72 72 78  Resp: 16 18 18 17   Temp: 98.2 F (36.8 C) 98.4 F (36.9 C) 98.2 F (36.8 C) 98.2 F (36.8 C)  TempSrc: Oral Oral Oral Oral  SpO2: 100% 100% 100% 99%  Weight:      Height:       Supplemental O2: Room Air SpO2: 99 %   Physical Exam:  Constitutional: Chronically ill-appearing, resting in bed, no acute distress HENT: normocephalic, atraumatic Cardiovascular: regular rate and rhythm, no m/r/g Pulmonary/Chest: normal work of breathing on room air, lungs clear to auscultation bilaterally  Filed Weights   02/23/24 1420  Weight: 63.5 kg     Intake/Output Summary (Last 24 hours) at 03/01/2024 1019 Last data filed at 03/01/2024 0500 Gross per 24 hour  Intake --  Output 1200 ml  Net -1200 ml   Net IO Since Admission: -5,678.72 mL [03/01/24 1019]  Pertinent Labs:    Latest Ref Rng & Units 03/01/2024    2:37 AM 02/29/2024    8:26 AM 02/28/2024    3:38 AM  CBC  WBC 4.0 - 10.5 K/uL 7.2  6.6  6.0   Hemoglobin 13.0 - 17.0 g/dL 9.2  89.0  9.9   Hematocrit 39.0 - 52.0 % 28.4  34.3  29.9   Platelets 150 - 400 K/uL 187  192  179        Latest Ref Rng & Units 02/29/2024    3:24 AM 02/28/2024    3:38 AM 02/27/2024    2:55 AM  CMP  Glucose 70 - 99 mg/dL 896  888  890   BUN 8 - 23 mg/dL 13  11   10    Creatinine 0.61 - 1.24 mg/dL 8.87  8.94  8.98   Sodium 135 - 145 mmol/L 142  139  140   Potassium 3.5 - 5.1 mmol/L 4.4  4.2  3.9   Chloride 98 - 111 mmol/L 106  105  106   CO2 22 - 32 mmol/L 25  24  26    Calcium  8.9 - 10.3 mg/dL 8.6  8.4  8.1     Imaging: No results found.  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 male with a  past medical history of dementia, prior CVA, history of polio, hypothyroidism who presents with concerns for a syncopal episode.  Patient found incidentally to have A. Calcoaceticus baumannii bacteremia and admitted for IV antibiotics.  #A. Calcoaceticus baumannii bacteremia Patient is still on Unasyn .  Patient is also on minocycline .  Plan will be for patient to have a more dose of minocycline  and Unasyn  on 03/02/2024.  No fevers, and patient is doing well. - 1 more day of Unasyn  and minocycline  - Anticipate discharge on 03/02/2024  #Syncope Syncope thought to be related to dehydration and sepsis in the setting of infection.  No more syncopal episodes during hospitalization.  Patient doing well. - Continue to monitor clinically  #Macrocytic Anemia Hemoglobin today is 9.2.  No acute concerns for bleeding.  This is down from 10.9 yesterday.  Could have been hemoconcentrated yesterday.  Will monitor closely.  Folate and vitamin B12 within normal limits. - Monitor closely  #Hypothyroidism No acute concerns at this time. - Continue home levothyroxine  50  mcg daily  #Fungal balanitis No acute concerns at this time - Continue clotrimazole  cream  #Dementia No acute concerns at this time. - Continue memantine  10 mg daily  #Onychomycosis No acute concerns at this time - Continue home terbinafine  cream  #BPH -Continue Flomax  0.5 mg nightly  #HLD -Continue atorvastatin  20 mg daily  #History of CVA -Continue home aspirin  81 mg daily - Continue atorvastatin  20 mg daily  Diet: Normal IVF: None,None VTE: Enoxaparin  Code: DNR/DNI Family Update: Wife at bedside   Dispo: Anticipated discharge to Home in 1 days pending Last day of Unasyn .   Libby Blanch DO Internal Medicine Resident PGY-3 Please contact the on call pager after 5 pm and on weekends at [phone]

## 2024-03-01 NOTE — Plan of Care (Signed)
  Problem: Pain Managment: Goal: General experience of comfort will improve and/or be controlled Outcome: Progressing   Problem: Safety: Goal: Ability to remain free from injury will improve Outcome: Progressing

## 2024-03-01 NOTE — TOC CM/SW Note (Addendum)
 Contacted Lauren at [facility] and she reports that pt was active with them before he was admitted. She reports that they will continue to f/u after pt is DC. She reports that pt needs a new ambulatory home hospice referral. Dr. Tobie notified that pt needs a home hospice referral. Will continue to f/u to assist with the DC plan.   Lauren's phone # [phone]

## 2024-03-02 DIAGNOSIS — A498 Other bacterial infections of unspecified site: Secondary | ICD-10-CM

## 2024-03-02 LAB — CBC
HCT: 31 % — ABNORMAL LOW (ref 39.0–52.0)
Hemoglobin: 10.3 g/dL — ABNORMAL LOW (ref 13.0–17.0)
MCH: 34.7 pg — ABNORMAL HIGH (ref 26.0–34.0)
MCHC: 33.2 g/dL (ref 30.0–36.0)
MCV: 104.4 fL — ABNORMAL HIGH (ref 80.0–100.0)
Platelets: 225 K/uL (ref 150–400)
RBC: 2.97 MIL/uL — ABNORMAL LOW (ref 4.22–5.81)
RDW: 13.1 % (ref 11.5–15.5)
WBC: 7.2 K/uL (ref 4.0–10.5)
nRBC: 0.7 % — ABNORMAL HIGH (ref 0.0–0.2)

## 2024-03-02 MED ORDER — CLOTRIMAZOLE 1 % EX CREA: TOPICAL_CREAM | Freq: Two times a day (BID) | CUTANEOUS | 0 refills | Status: AC

## 2024-03-02 MED ORDER — TERBINAFINE HCL 1 % EX CREA: TOPICAL_CREAM | Freq: Every day | CUTANEOUS | 0 refills | Status: AC

## 2024-03-02 MED ORDER — AMLODIPINE BESYLATE 5 MG PO TABS: 5.0000 mg | ORAL_TABLET | Freq: Every day | ORAL | 0 refills | Status: AC

## 2024-03-02 NOTE — Progress Notes (Signed)
 Pt discharged home as per MD's order. Transported by [facility] ambulance via stretcher. All Pt's belongings are with his wife Naomie. AVS discussed with wife and verbalized understanding. VSS upon discharged, IV removed clean, dry and intact.

## 2024-03-02 NOTE — TOC Progression Note (Signed)
 Transition of Care [facility]) - Progression Note    Patient Details  Name: Mike Mack MRN: [id number] Date of Birth: January 03, 1936  [facility] CM/SW Contact  Mannix Kroeker, [place], [place] Phone Number: 03/02/2024, 1:19 PM  Clinical Narrative:     Patient to discharge home using [facility] for transport. [facility] called at 1:18PM  Leronda Lewers, LCSW Transition of Care    Expected Discharge Plan: Home/Self Care Barriers to Discharge: Continued Medical Work up               Expected Discharge Plan and Services   Discharge Planning Services: CM Consult Post Acute Care Choice: NA Living arrangements for the past 2 months: Single Family Home Expected Discharge Date: 03/02/24               DME Arranged: N/A DME Agency: NA       HH Arranged: NA HH Agency: NA         Social Drivers of Health (SDOH) Interventions SDOH Screenings   Food Insecurity: No Food Insecurity (02/23/2024)  Housing: Low Risk  (02/23/2024)  Transportation Needs: No Transportation Needs (02/23/2024)  Utilities: Not At Risk (02/23/2024)  Social Connections: Unknown (02/23/2024)  Tobacco Use: Low Risk  (02/23/2024)    Readmission Risk Interventions    08/08/2023    4:02 PM 09/04/2022   10:29 AM 08/24/2022   10:47 AM  Readmission Risk Prevention Plan  Transportation Screening Complete Complete Complete  PCP or Specialist Appt within 3-5 Days Complete  Complete  HRI or Home Care Consult Complete  Complete  Social Work Consult for Recovery Care Planning/Counseling Complete  Complete  Palliative Care Screening Not Applicable  Not Applicable  Medication Review [facility]) Referral to Pharmacy Complete Referral to Pharmacy  PCP or Specialist appointment within 3-5 days of discharge  Complete   HRI or Home Care Consult  Complete   SW Recovery Care/Counseling Consult  Complete   Palliative Care Screening  Not Applicable   Skilled Nursing Facility  Not Applicable

## 2024-03-02 NOTE — Discharge Instructions (Addendum)
 To Mike Mack or their caretakers,  You were recently admitted to [facility] for bacteremia--bacterial infection of blood.   Continue taking your home medications with the following changes:  Start taking Amlodipine  5 mg : 1 tablet daily Terbinafine  cream: apply to both feet especially around toe nails Clotrimazole  cream: apply on the head of the penis  Stop taking Pyridium Amlodipine  2.5 mg tablet  Continue taking All other home medications   You should seek further medical care if symptoms start again or worsen.  Please follow up with the following doctors/specialties: Primary Care Physician in 1 week from discharge   We recommend that you also see your primary care doctor in about a week to make sure that you continue to improve. We are so glad that you are feeling better.  Sincerely,  [facility]

## 2024-03-02 NOTE — Discharge Summary (Signed)
 Name: Mike Mack MRN: [id number] DOB: [birth year] 88 y.o. PCP: Mack, Mike E, NP  Date of Admission: 02/23/2024 11:01 AM Date of Discharge: 03/02/2024 Attending Physician: Dr. Reyes Fenton  Discharge Diagnosis: 1. 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   History of recurrent UTIs   Dependent for wheelchair mobility   Pressure injury of right foot, stage 1   Tinea pedis of both feet   Pain due to onychomycosis of toenails of both feet   Bacteremia   Sepsis due to Acinetobacter baumannii [facility])   Discharge Medications: Allergies as of 03/02/2024       Reactions   Insulin  Glargine    Insulin  Glargine-lixisenatide Other (See Comments), Nausea And Vomiting   Stomach cramps   Metformin  And Related Nausea And Vomiting, Other (See Comments)   Gi intolerance   Nsaids Other (See Comments)   Renal insufficiency        Medication List     STOP taking these medications    phenazopyridine 100 MG tablet Commonly known as: PYRIDIUM       TAKE these medications    acetaminophen  325 MG tablet Commonly known as: TYLENOL  Take 2 tablets (650 mg total) by mouth every 6 (six) hours as needed for mild pain (or Fever >/= 101).   amLODipine  5 MG tablet Commonly known as: NORVASC  Take 1 tablet (5 mg total) by mouth daily. What changed:  medication strength how much to take   aspirin  EC 81 MG tablet Take 1 tablet (81 mg total) by mouth daily. Swallow whole.   atorvastatin  20 MG tablet Commonly known as: LIPITOR Take 20 mg by mouth every evening.   B COMPLEX PO Take 1 tablet by mouth at bedtime.   brimonidine  0.2 % ophthalmic solution Commonly known as: ALPHAGAN  Place 1 drop into both eyes 2 (two) times daily.    cholecalciferol  10 MCG (400 UNIT) Tabs tablet Commonly known as: VITAMIN D3 Take 400 Units by mouth at bedtime.   clotrimazole  1 % cream Commonly known as: LOTRIMIN  Apply topically 2 (two) times daily.   dorzolamide -timolol  2-0.5 % ophthalmic solution Commonly known as: COSOPT  Place 1 drop into both eyes 2 (two) times daily.   Kerendia  10 MG Tabs Generic drug: Finerenone  Take 10 mg by mouth daily.   latanoprost  0.005 % ophthalmic solution Commonly known as: XALATAN  Place 1 drop into both eyes at bedtime.   levothyroxine  50 MCG tablet Commonly known as: SYNTHROID  Take 1 tablet (50 mcg total) by mouth daily at 6 (six) AM.   memantine  10 MG tablet Commonly known as: NAMENDA  Take 10 mg by mouth in the morning and at bedtime.   pantoprazole  40 MG tablet Commonly known as: Protonix  Take 1 tablet (40 mg total) by mouth daily.   polyethylene glycol 17 g packet Commonly known as: MIRALAX  / GLYCOLAX  Take 17 g by mouth daily. What changed: when to take this   Rhopressa  0.02 % Soln Generic drug: Netarsudil  Dimesylate Place 1 drop into both eyes at bedtime.   tamsulosin  0.4 MG Caps capsule Commonly known as: FLOMAX  Take 0.4 mg by mouth at bedtime.   terbinafine  1 % cream Commonly known as: LAMISIL  Apply topically daily.  Disposition and follow-up:   [name] was discharged from [facility] in Good condition.  At the hospital follow up visit please address:  1.  Onychomycosis in feet, balanitis, if pt has any fevers or chills, any syncopal episodes after discharge  2.  Labs / imaging needed at time of follow-up: CBC  3.  Pending labs/ test needing follow-up: none  Follow-up Appointments: PCP   Hospital Course by problem list: This is a 88 year old gentleman with a past medical history of dementia, prior CVA, history of polio, hypothyroidism who presented with concerns for a syncopal episode.  Patient found incidentally to have A.  Calcoaceticus baumannii bacteremia and admitted for IV antibiotics.  Syncope A. Calcoaceticus baumannii Bacteremia Pt was brought in by EMS after wife found him unresponsive in his wheelchair. Wife mentioned about pt having a similar episode previously and being diagnosed with UTI then along with several episodes of UTI thereafter. His syncopal episode was believed to be due to dehydration versus UTI.  Patient was initially treated with IV Rocephin  for UA being positive for nitrites and leukocytes.  Urine culture came back  negative. IV Rocephin  was changed to Unasyn  as patient's blood cultures came back positive for A. Calcoaceticus baumannii bacteremia.  ID was consulted for further management of bacteremia.  Patient was started on minocycline  200 mg twice daily.  Per ID recommendations, patient was to complete antibiotics for 7 days.  Patient completed last dose of IV Unasyn  and PO minocycline  on 03/02/2024 (Day 7).  On discharge evaluation patient was stable and denied any acute complaints. Pt was previously a hospice pt at [facility]. He will be discharged today and reenroll as [facility] hospice pt.   Balanitis  On admission, pt had ongoing fungal discharge present under foreskin, likely due to poor hygiene. Wife was unaware of this. Pt did have an episode of minor bleeding due to a small tear in the penis which resolved thereafter. He reported no acute complaints or symptoms of itching around the pelvic area. Topical clotrimazole  1% cream was applied in the area.  Onychomycosis Pt came in with severe onychomycosis of toe nails with macerations and exposed nailbeds. There was bleeding under the nailfolds of his left toes. We were concerned for cellulitis and osteomyelitis in his toes given they were red and swollen. However, MRI of both feet were negative for any infections. Treated pt with topical antifungal cream in the area.   Bilateral Conjunctivitis On admission, pt's eyes were matted shut with  purulent discharge. Conjunctivitis was likely due to entropion and lid apraxia (due to dementia). Treated with antibacterial drops. No mucopurulent discharge noted on discharge. Pt denied any concerns.   Dementia  On admission, pt was oriented to self only. However, his mentation improved and he was A&Ox3. Pt was good at conversing and following all our commands during PE. Continued treating him with home Memantine .  Benign Prostatic Hyperplasia Continued flomax  0.4 mg    Hypothyroidism Continued synthroid    HLD Continued atorvastatin  20mg    Hx CVA Continued Asprin 81 mg      Subjective Saw pt at bedside this AM. Pt denied any fevers, chills. Had some pain in his feet which was not different from before. Pt denied any other acute complaints.   Discharge Exam:   BP (!) 173/80 (BP Location: Right Arm)   Pulse 71   Temp 97.9 F (36.6 C) (Oral)   Resp 18   Ht 5' 5 (1.651 m)   Wt 63.5 kg  SpO2 100%   BMI 23.30 kg/m  Discharge exam:  Gen: A&Ox3. In NAD Cardiovascular: RRR-no m/r/g Pulmonary: CTA-B Skin: Severe onychomycosis noted in toe nails of both feet. No bleeding noted in right or left toe nails. Toes swollen.    Pertinent Labs, Studies, and Procedures:     Latest Ref Rng & Units 03/02/2024    7:56 AM 03/01/2024    2:37 AM 02/29/2024    8:26 AM  CBC  WBC 4.0 - 10.5 K/uL 7.2  7.2  6.6   Hemoglobin 13.0 - 17.0 g/dL 89.6  9.2  89.0   Hematocrit 39.0 - 52.0 % 31.0  28.4  34.3   Platelets 150 - 400 K/uL 225  187  192        Latest Ref Rng & Units 02/29/2024    3:24 AM 02/28/2024    3:38 AM 02/27/2024    2:55 AM  CMP  Glucose 70 - 99 mg/dL 896  888  890   BUN 8 - 23 mg/dL 13  11  10    Creatinine 0.61 - 1.24 mg/dL 8.87  8.94  8.98   Sodium 135 - 145 mmol/L 142  139  140   Potassium 3.5 - 5.1 mmol/L 4.4  4.2  3.9   Chloride 98 - 111 mmol/L 106  105  106   CO2 22 - 32 mmol/L 25  24  26    Calcium  8.9 - 10.3 mg/dL 8.6  8.4  8.1     MR FOOT LEFT WO CONTRAST Result  Date: 02/25/2024 CLINICAL DATA:  concern for foot infection Left forefoot swelling and erythema. EXAM: MRI OF THE LEFT FOOT WITHOUT CONTRAST TECHNIQUE: Multiplanar, multisequence MR imaging of the left forefoot was performed. No intravenous contrast was administered. COMPARISON:  None Available. FINDINGS: Bones/Joint/Cartilage Hammertoe deformities of all the digits with apparent soft tissue swelling and ulceration along 2nd nail bed. No evidence of underlying osteomyelitis or septic arthritis. There is no evidence of acute fracture, dislocation or significant joint effusion. The forefoot joint spaces appear preserved. Suspected incidental chondroid lesion within the base of the 3rd metatarsal, measuring up to 1.1 cm on image 19/1017. This demonstrates heterogeneous T2 hyperintensity and no aggressive characteristics. Ligaments Intact Lisfranc ligament. The collateral ligaments of the metatarsophalangeal joints appear intact. Muscles and Tendons Diffuse T2 hyperintensity throughout the forefoot musculature. The forefoot tendons appear intact, without evidence of tenosynovitis. Soft tissues As above, possible soft tissue ulceration along the nail bed of the 2nd toe. There appear to be overlying bandages long the dorsal aspects of the toes. There is mild dorsal subcutaneous edema. No evidence of organized fluid collection, foreign body or soft tissue emphysema. IMPRESSION: 1. Possible soft tissue ulceration along the nail bed of the 2nd toe. No evidence of underlying osteomyelitis or septic arthritis. 2. 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 3. No evidence of soft tissue abscess or tenosynovitis. 4. Suspected incidental chondroid lesion within the base of the 3rd metatarsal. Electronically Signed   By: Elsie Perone M.D.   On: 02/25/2024 08:27   DG Foot 2 Views Right Result Date: 02/24/2024 CLINICAL DATA:  Right foot swelling. EXAM: RIGHT FOOT - 2 VIEW COMPARISON:   None Available. FINDINGS: Diffuse osteopenia is noted. Vascular calcifications are noted. No definite fracture or dislocation is noted. Dorsal soft tissue swelling is noted suggesting infection or edema. No definite lytic abnormality is noted IMPRESSION: Dorsal soft tissue swelling is noted suggesting edema or infection. No definite fracture or dislocation  is noted. Electronically Signed   By: Lynwood Landy Raddle M.D.   On: 02/24/2024 13:41   CT Head Wo Contrast Result Date: 02/23/2024 CLINICAL DATA:  Altered mental status. EXAM: CT HEAD WITHOUT CONTRAST TECHNIQUE: Contiguous axial images were obtained from the base of the skull through the vertex without intravenous contrast. RADIATION DOSE REDUCTION: This exam was performed according to the departmental dose-optimization program which includes automated exposure control, adjustment of the mA and/or kV according to patient size and/or use of iterative reconstruction technique. COMPARISON:  August 06, 2023 FINDINGS: Brain: There is a generalized cerebral atrophy with widening of the extra-axial spaces and stable ventricular dilatation. There are areas of decreased attenuation within the white matter tracts of the supratentorial brain, consistent with microvascular disease changes. Small, chronic bilateral para thalamic lacunar infarcts are seen. Small, chronic bilateral cerebellar infarcts are also noted. Vascular: Moderate severity bilateral cavus carotid artery calcification is noted. Skull: Normal. Negative for fracture or focal lesion. Sinuses/Orbits: No acute finding. Other: None. IMPRESSION: 1. Generalized cerebral atrophy with stable ventricular dilatation. 2. Chronic bilateral para thalamic and bilateral cerebellar infarcts. 3. No acute intracranial abnormality. Electronically Signed   By: Suzen Dials M.D.   On: 02/23/2024 12:22   DG Chest Port 1 View Result Date: 02/23/2024 CLINICAL DATA:  Weakness. EXAM: PORTABLE CHEST 1 VIEW COMPARISON:  August 06, 2023 FINDINGS: The heart size and mediastinal contours are within normal limits. Low lung volumes are noted. No acute infiltrate, pleural effusion or pneumothorax is identified. The visualized skeletal structures are unremarkable. IMPRESSION: No active disease. Electronically Signed   By: Suzen Dials M.D.   On: 02/23/2024 11:31     Discharge Instructions: Discharge Instructions     Ambulatory referral to Hospice   Complete by: As directed    Home Hospice       Signed: Edgardo Pontiff, DO 03/02/2024, 9:15 AM

## 2024-03-02 NOTE — Plan of Care (Signed)

## 2024-03-24 ENCOUNTER — Other Ambulatory Visit: Payer: Self-pay

## 2024-03-24 NOTE — Telephone Encounter (Signed)
 Not [facility] patient
